# Patient Record
Sex: Male | Born: 1935 | Race: White | Hispanic: No | Marital: Married | Smoking: Never smoker
Health system: Southern US, Community
[De-identification: ages and names within clinical notes are randomized; demographics above are authoritative.]

## PROBLEM LIST (undated history)

## (undated) DIAGNOSIS — B49 Unspecified mycosis: Secondary | ICD-10-CM

## (undated) DIAGNOSIS — D126 Benign neoplasm of colon, unspecified: Secondary | ICD-10-CM

## (undated) DIAGNOSIS — I350 Nonrheumatic aortic (valve) stenosis: Secondary | ICD-10-CM

## (undated) DIAGNOSIS — Z87442 Personal history of urinary calculi: Secondary | ICD-10-CM

## (undated) DIAGNOSIS — J189 Pneumonia, unspecified organism: Secondary | ICD-10-CM

## (undated) DIAGNOSIS — N184 Chronic kidney disease, stage 4 (severe): Secondary | ICD-10-CM

## (undated) DIAGNOSIS — I10 Essential (primary) hypertension: Secondary | ICD-10-CM

## (undated) DIAGNOSIS — M3130 Wegener's granulomatosis without renal involvement: Secondary | ICD-10-CM

## (undated) DIAGNOSIS — K449 Diaphragmatic hernia without obstruction or gangrene: Secondary | ICD-10-CM

## (undated) DIAGNOSIS — I340 Nonrheumatic mitral (valve) insufficiency: Secondary | ICD-10-CM

## (undated) DIAGNOSIS — J42 Unspecified chronic bronchitis: Secondary | ICD-10-CM

## (undated) DIAGNOSIS — K922 Gastrointestinal hemorrhage, unspecified: Secondary | ICD-10-CM

## (undated) DIAGNOSIS — K219 Gastro-esophageal reflux disease without esophagitis: Secondary | ICD-10-CM

## (undated) DIAGNOSIS — H9190 Unspecified hearing loss, unspecified ear: Secondary | ICD-10-CM

## (undated) DIAGNOSIS — I724 Aneurysm of artery of lower extremity: Secondary | ICD-10-CM

## (undated) DIAGNOSIS — K5790 Diverticulosis of intestine, part unspecified, without perforation or abscess without bleeding: Secondary | ICD-10-CM

## (undated) DIAGNOSIS — N400 Enlarged prostate without lower urinary tract symptoms: Secondary | ICD-10-CM

## (undated) DIAGNOSIS — K222 Esophageal obstruction: Secondary | ICD-10-CM

## (undated) DIAGNOSIS — J449 Chronic obstructive pulmonary disease, unspecified: Secondary | ICD-10-CM

## (undated) DIAGNOSIS — J479 Bronchiectasis, uncomplicated: Secondary | ICD-10-CM

## (undated) DIAGNOSIS — I2510 Atherosclerotic heart disease of native coronary artery without angina pectoris: Secondary | ICD-10-CM

## (undated) DIAGNOSIS — Z9289 Personal history of other medical treatment: Secondary | ICD-10-CM

## (undated) DIAGNOSIS — D649 Anemia, unspecified: Secondary | ICD-10-CM

## (undated) DIAGNOSIS — I255 Ischemic cardiomyopathy: Secondary | ICD-10-CM

## (undated) HISTORY — DX: Benign neoplasm of colon, unspecified: D12.6

## (undated) HISTORY — DX: Bronchiectasis, uncomplicated: J47.9

## (undated) HISTORY — DX: Gastro-esophageal reflux disease without esophagitis: K21.9

## (undated) HISTORY — PX: RENAL BIOPSY: SHX156

## (undated) HISTORY — DX: Benign prostatic hyperplasia without lower urinary tract symptoms: N40.0

## (undated) HISTORY — PX: TRANSTHORACIC ECHOCARDIOGRAM: SHX275

## (undated) HISTORY — DX: Diverticulosis of intestine, part unspecified, without perforation or abscess without bleeding: K57.90

## (undated) HISTORY — DX: Wegener's granulomatosis without renal involvement: M31.30

## (undated) HISTORY — DX: Anemia, unspecified: D64.9

## (undated) HISTORY — DX: Unspecified mycosis: B49

## (undated) HISTORY — PX: INGUINAL HERNIA REPAIR: SUR1180

## (undated) HISTORY — DX: Diaphragmatic hernia without obstruction or gangrene: K44.9

## (undated) HISTORY — DX: Esophageal obstruction: K22.2

---

## 2000-08-08 LAB — HM COLONOSCOPY

## 2000-09-26 ENCOUNTER — Ambulatory Visit (HOSPITAL_COMMUNITY): Admission: RE | Admit: 2000-09-26 | Discharge: 2000-09-26 | Payer: Self-pay | Admitting: Internal Medicine

## 2000-09-26 ENCOUNTER — Encounter: Payer: Self-pay | Admitting: Internal Medicine

## 2001-01-17 ENCOUNTER — Ambulatory Visit (HOSPITAL_COMMUNITY): Admission: RE | Admit: 2001-01-17 | Discharge: 2001-01-17 | Payer: Self-pay | Admitting: Gastroenterology

## 2001-06-28 ENCOUNTER — Ambulatory Visit (HOSPITAL_COMMUNITY): Admission: RE | Admit: 2001-06-28 | Discharge: 2001-06-28 | Payer: Self-pay | Admitting: *Deleted

## 2001-07-02 ENCOUNTER — Ambulatory Visit (HOSPITAL_COMMUNITY): Admission: RE | Admit: 2001-07-02 | Discharge: 2001-07-02 | Payer: Self-pay | Admitting: Urology

## 2001-12-13 ENCOUNTER — Encounter: Admission: RE | Admit: 2001-12-13 | Discharge: 2001-12-13 | Payer: Self-pay | Admitting: Urology

## 2001-12-13 ENCOUNTER — Encounter: Payer: Self-pay | Admitting: Urology

## 2001-12-18 ENCOUNTER — Encounter: Admission: RE | Admit: 2001-12-18 | Discharge: 2001-12-18 | Payer: Self-pay | Admitting: Urology

## 2001-12-18 ENCOUNTER — Encounter: Payer: Self-pay | Admitting: Urology

## 2001-12-24 ENCOUNTER — Ambulatory Visit (HOSPITAL_BASED_OUTPATIENT_CLINIC_OR_DEPARTMENT_OTHER): Admission: RE | Admit: 2001-12-24 | Discharge: 2001-12-24 | Payer: Self-pay | Admitting: Urology

## 2001-12-24 ENCOUNTER — Encounter: Payer: Self-pay | Admitting: Urology

## 2002-01-25 ENCOUNTER — Encounter: Admission: RE | Admit: 2002-01-25 | Discharge: 2002-01-25 | Payer: Self-pay | Admitting: Family Medicine

## 2002-01-25 ENCOUNTER — Encounter: Payer: Self-pay | Admitting: Urology

## 2002-01-25 ENCOUNTER — Encounter: Payer: Self-pay | Admitting: Family Medicine

## 2002-01-25 ENCOUNTER — Ambulatory Visit (HOSPITAL_COMMUNITY): Admission: RE | Admit: 2002-01-25 | Discharge: 2002-01-26 | Payer: Self-pay | Admitting: Interventional Radiology

## 2002-01-29 ENCOUNTER — Ambulatory Visit (HOSPITAL_COMMUNITY): Admission: RE | Admit: 2002-01-29 | Discharge: 2002-01-29 | Payer: Self-pay | Admitting: Urology

## 2002-01-29 ENCOUNTER — Encounter: Payer: Self-pay | Admitting: Urology

## 2002-02-12 ENCOUNTER — Encounter: Admission: RE | Admit: 2002-02-12 | Discharge: 2002-02-12 | Payer: Self-pay | Admitting: Urology

## 2002-02-12 ENCOUNTER — Encounter: Payer: Self-pay | Admitting: Urology

## 2002-02-14 ENCOUNTER — Ambulatory Visit (HOSPITAL_BASED_OUTPATIENT_CLINIC_OR_DEPARTMENT_OTHER): Admission: RE | Admit: 2002-02-14 | Discharge: 2002-02-14 | Payer: Self-pay | Admitting: Urology

## 2002-02-14 ENCOUNTER — Encounter: Payer: Self-pay | Admitting: Urology

## 2002-02-25 ENCOUNTER — Encounter: Admission: RE | Admit: 2002-02-25 | Discharge: 2002-02-25 | Payer: Self-pay | Admitting: Urology

## 2002-02-25 ENCOUNTER — Encounter: Payer: Self-pay | Admitting: Urology

## 2002-02-27 ENCOUNTER — Encounter: Admission: RE | Admit: 2002-02-27 | Discharge: 2002-02-27 | Payer: Self-pay | Admitting: Urology

## 2002-02-27 ENCOUNTER — Encounter: Payer: Self-pay | Admitting: Urology

## 2002-02-28 ENCOUNTER — Ambulatory Visit (HOSPITAL_COMMUNITY): Admission: RE | Admit: 2002-02-28 | Discharge: 2002-02-28 | Payer: Self-pay | Admitting: Urology

## 2002-03-05 ENCOUNTER — Encounter: Payer: Self-pay | Admitting: Urology

## 2002-03-05 ENCOUNTER — Encounter: Admission: RE | Admit: 2002-03-05 | Discharge: 2002-03-05 | Payer: Self-pay | Admitting: Urology

## 2002-03-19 ENCOUNTER — Encounter: Admission: RE | Admit: 2002-03-19 | Discharge: 2002-03-19 | Payer: Self-pay | Admitting: Urology

## 2002-03-19 ENCOUNTER — Encounter: Payer: Self-pay | Admitting: Urology

## 2002-04-05 ENCOUNTER — Encounter: Payer: Self-pay | Admitting: Urology

## 2002-04-05 ENCOUNTER — Encounter: Admission: RE | Admit: 2002-04-05 | Discharge: 2002-04-05 | Payer: Self-pay | Admitting: Urology

## 2002-04-15 ENCOUNTER — Ambulatory Visit (HOSPITAL_BASED_OUTPATIENT_CLINIC_OR_DEPARTMENT_OTHER): Admission: RE | Admit: 2002-04-15 | Discharge: 2002-04-15 | Payer: Self-pay | Admitting: Urology

## 2002-09-09 ENCOUNTER — Encounter: Admission: RE | Admit: 2002-09-09 | Discharge: 2002-09-09 | Payer: Self-pay | Admitting: *Deleted

## 2003-07-14 ENCOUNTER — Encounter: Admission: RE | Admit: 2003-07-14 | Discharge: 2003-07-14 | Payer: Self-pay | Admitting: Family Medicine

## 2003-07-24 ENCOUNTER — Encounter: Admission: RE | Admit: 2003-07-24 | Discharge: 2003-07-24 | Payer: Self-pay | Admitting: Family Medicine

## 2004-03-04 IMAGING — CT CT CHEST W/ CM
1 of 2 series · 14 of 30 positions shown, 18 images · IV contrast (100 ML OMNI 300)
Comparison: none

CLINICAL DATA: Cough.  Questionable nodule on recent chest x-ray.   Con 493.0. 
CT CHEST W/CONTRAST
Multidetector helical scans through the chest were performed after IV contrast media was given.  055cc of Omnipaque 300 were given as the contrast media to this patient with a history of asthma on an inhaler.  This study is compared to limited CT of the chest from [REDACTED] through the lung bases dated 09/26/00 as well as a two view chest x-ray from [REDACTED] dated 07/14/03. 
At the site questioned in the periphery of the left upper lung field there is a small pleural based opacity on image number 19 present measuring 5mm.  Also within the right lung apex posteromedially there is nodular soft tissue present.  On image number 5 this nodular area measures 24 x 14mm.  Although this may simply represent right apical pleuroparenchymal scarring a right apical lesion is difficult to exclude and follow up CT of the chest in four months is recommended.  There is focal pleural thickening in the anterolateral right upper lobe as well on image number 16.  There is a small vague nodular opacity in the inferior right upper lobe on image number 20 as well as a poorly defined opacity posterior in the left lower lobe on image number 31.  Peripheral opacity is noted in the right middle lobe on image number 35 with a small nodular opacity in the lingula on image number 37.  Also changes of bronchiectasis are noted involving the left lower lobe, to a lesser degree lingula and right middle lobe.  In view of the bronchiectatic change the nodular opacities present may be related to scarring from prior infection.  Follow up CT of the chest is recommended in four months to assess stability.  No pleural effusion is seen.  No mediastinal or hilar adenopathy is noted.  The pulmonary arteries and thoracic aorta opacify normally.  There is a large hiatal hernia present.  
IMPRESSION
The nodular opacity questioned on recent chest x-ray in the periphery of the left upper lobe is identified on CT of the chest and measures approximately 5mm.  However there are other poorly defined nodular opacities throughout the lungs several of which appear most typical of scarring.  There is also nodular opacity in the posteromedial right lung apex which may represent scarring or conceivably tumor.  In view of these findings follow up CT of the chest in four months is recommended. 
Changes of bronchiectasis are noted primarily involving the left lower lobe with lesser involvement of the lingula and right middle lobe as well.  
Large hiatal hernia.

[Series 2: — · axial · 0.66mm/px · z∈[-256,+14]mm · 14 of 64 slices shown, 18 images]
[im 5/64  mediastinal]
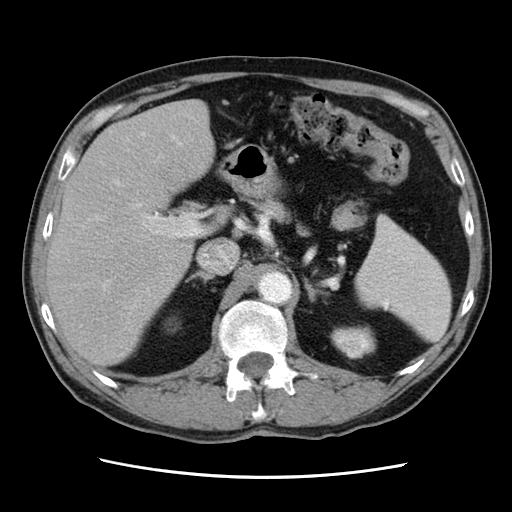
[im 5/64  lung]
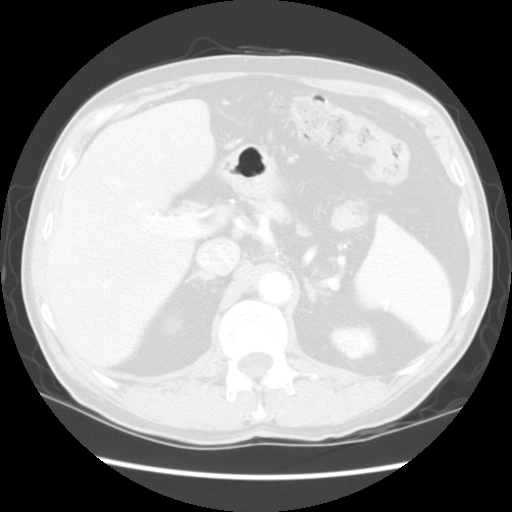
[im 10/64  lung]
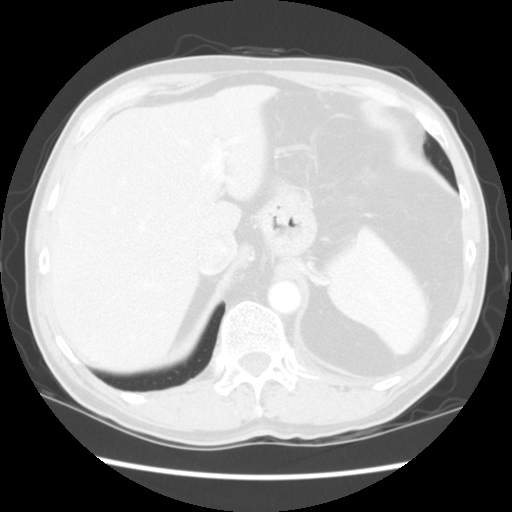
[im 14/64  lung]
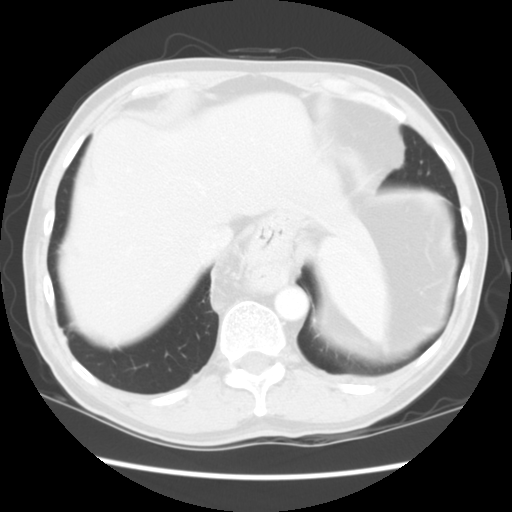
[im 19/64  lung]
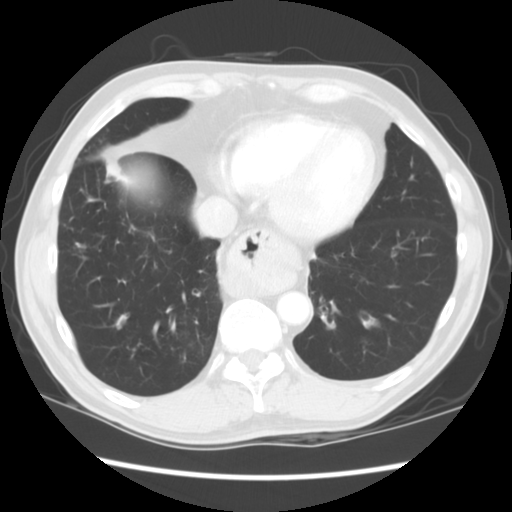
[im 23/64  mediastinal]
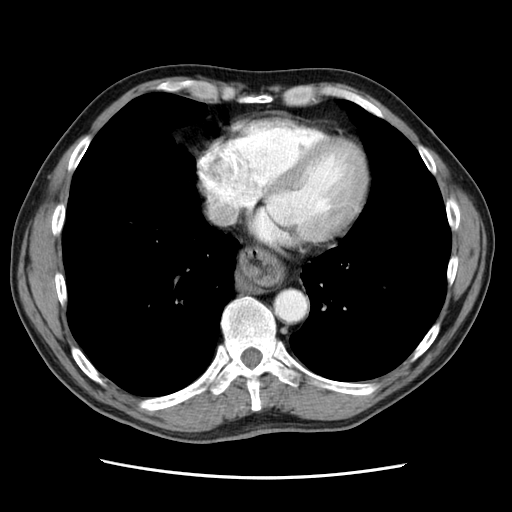
[im 23/64  lung]
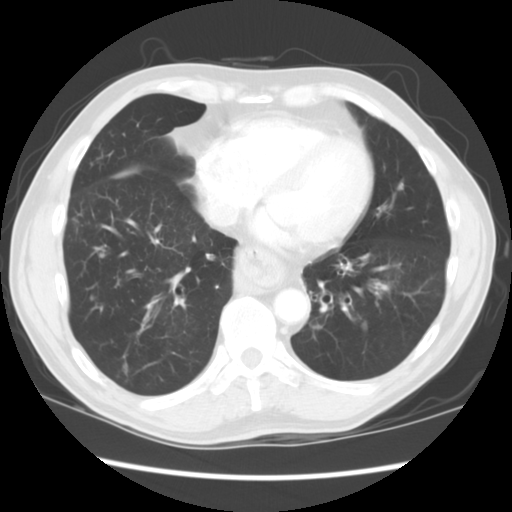
[im 28/64  lung]
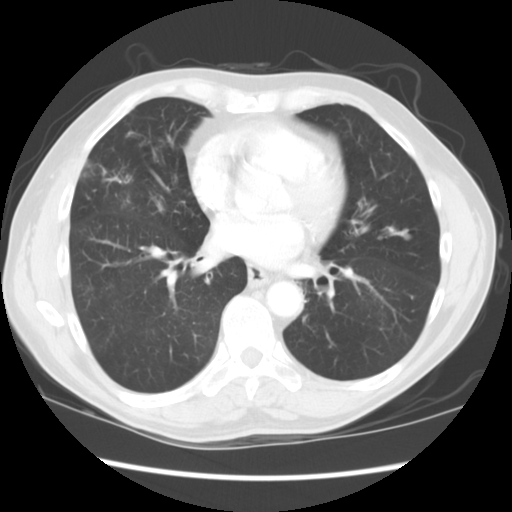
[im 31/64  lung]
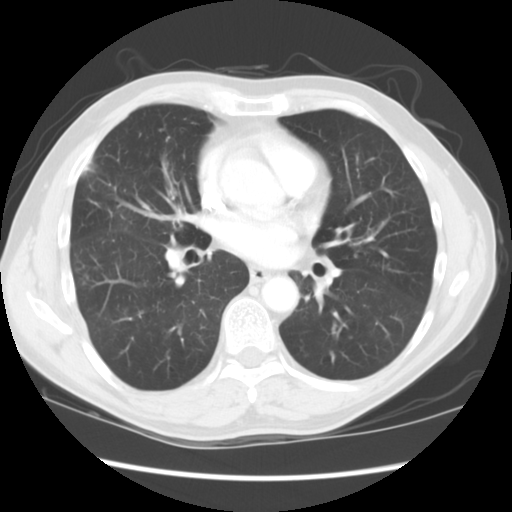
[im 32/64  lung]
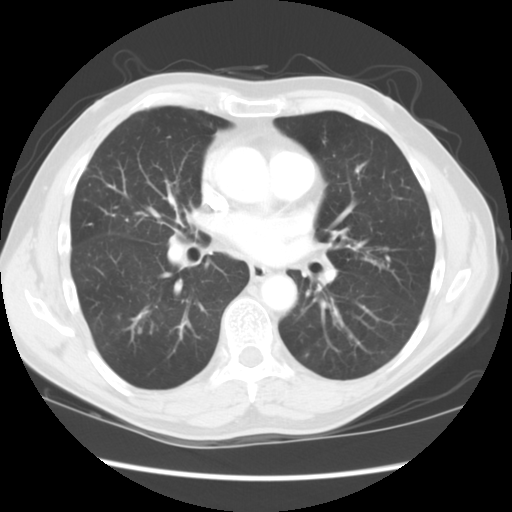
[im 37/64  mediastinal]
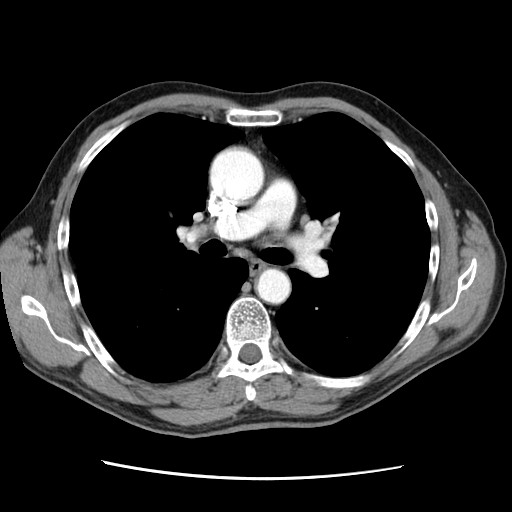
[im 37/64  lung]
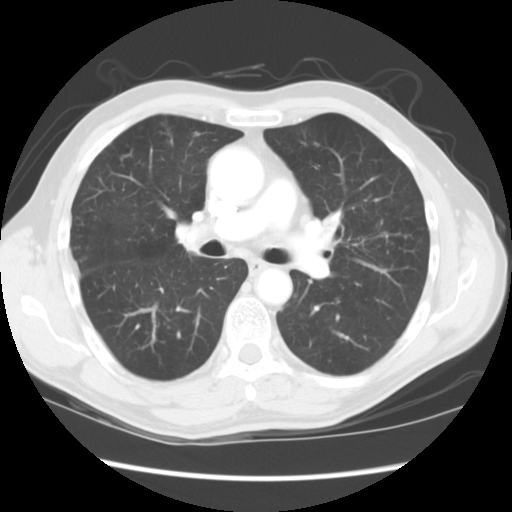
[im 41/64  lung]
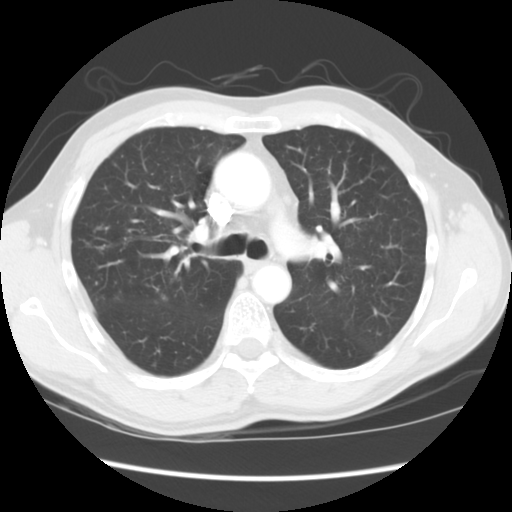
[im 46/64  lung]
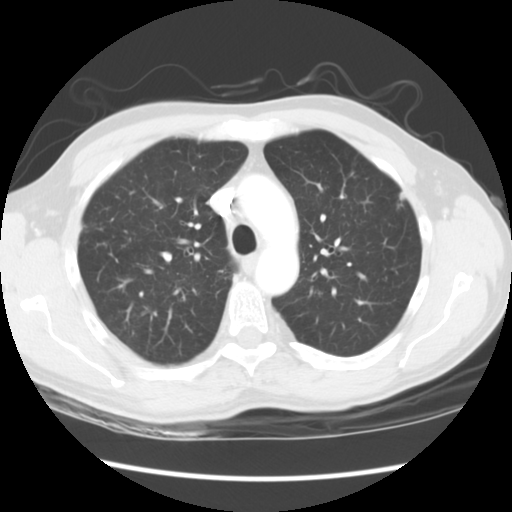
[im 50/64  lung]
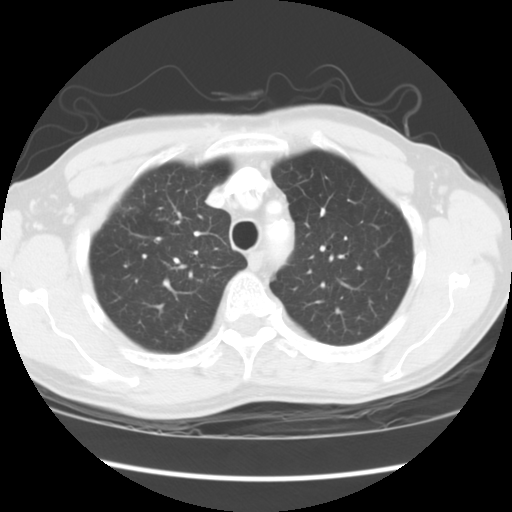
[im 55/64  mediastinal]
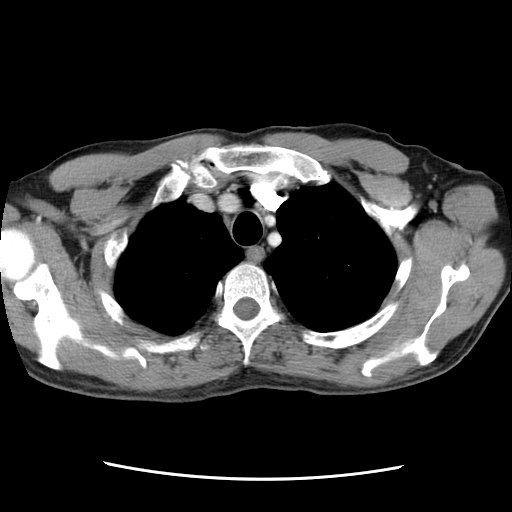
[im 55/64  lung]
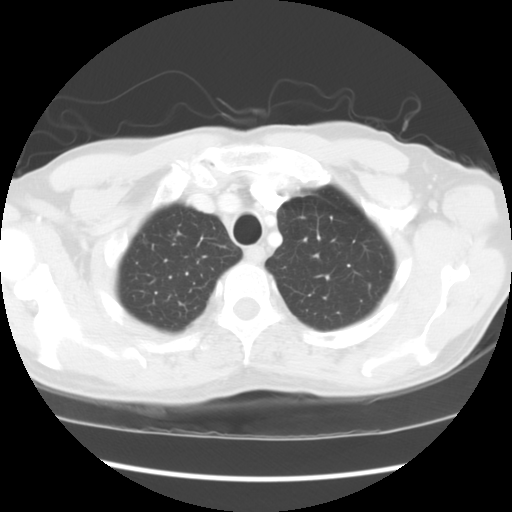
[im 59/64  lung]
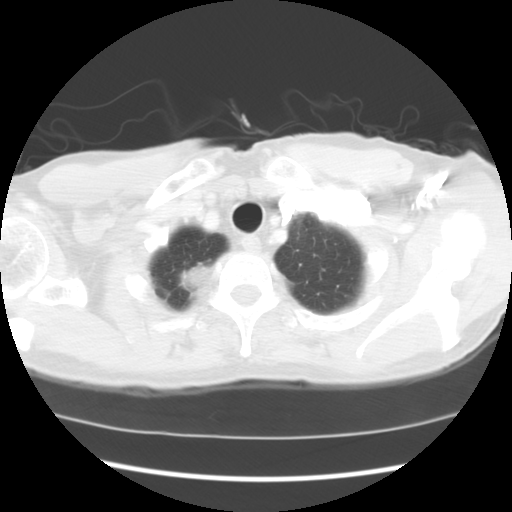

[14 of 30 positions shown; findings below may reference images not displayed]

## 2004-05-27 ENCOUNTER — Emergency Department (HOSPITAL_COMMUNITY): Admission: EM | Admit: 2004-05-27 | Discharge: 2004-05-27 | Payer: Self-pay | Admitting: Emergency Medicine

## 2004-06-21 ENCOUNTER — Encounter (INDEPENDENT_AMBULATORY_CARE_PROVIDER_SITE_OTHER): Payer: Self-pay | Admitting: *Deleted

## 2004-06-21 ENCOUNTER — Ambulatory Visit (HOSPITAL_COMMUNITY): Admission: RE | Admit: 2004-06-21 | Discharge: 2004-06-21 | Payer: Self-pay | Admitting: Internal Medicine

## 2004-09-03 ENCOUNTER — Ambulatory Visit: Payer: Self-pay | Admitting: Internal Medicine

## 2004-10-05 ENCOUNTER — Ambulatory Visit: Payer: Self-pay | Admitting: Family Medicine

## 2004-10-26 ENCOUNTER — Ambulatory Visit: Payer: Self-pay | Admitting: Family Medicine

## 2004-11-15 ENCOUNTER — Ambulatory Visit: Payer: Self-pay | Admitting: Family Medicine

## 2004-11-23 ENCOUNTER — Ambulatory Visit: Payer: Self-pay | Admitting: Family Medicine

## 2004-12-03 ENCOUNTER — Ambulatory Visit: Payer: Self-pay | Admitting: Family Medicine

## 2004-12-07 ENCOUNTER — Ambulatory Visit: Payer: Self-pay | Admitting: Family Medicine

## 2004-12-21 ENCOUNTER — Ambulatory Visit: Payer: Self-pay | Admitting: Family Medicine

## 2005-01-04 ENCOUNTER — Ambulatory Visit: Payer: Self-pay | Admitting: Internal Medicine

## 2005-01-06 IMAGING — CR DG HAND COMPLETE 3+V*L*
3 series · 3 of 3 positions shown · non-contrast
Comparison: None.

CLINICAL DATA: Laceration over the MCP of the index finger.  Pain.  

 TWO VIEW LEFT HAND, 05/27/04

[view not recorded (1 of 3)]
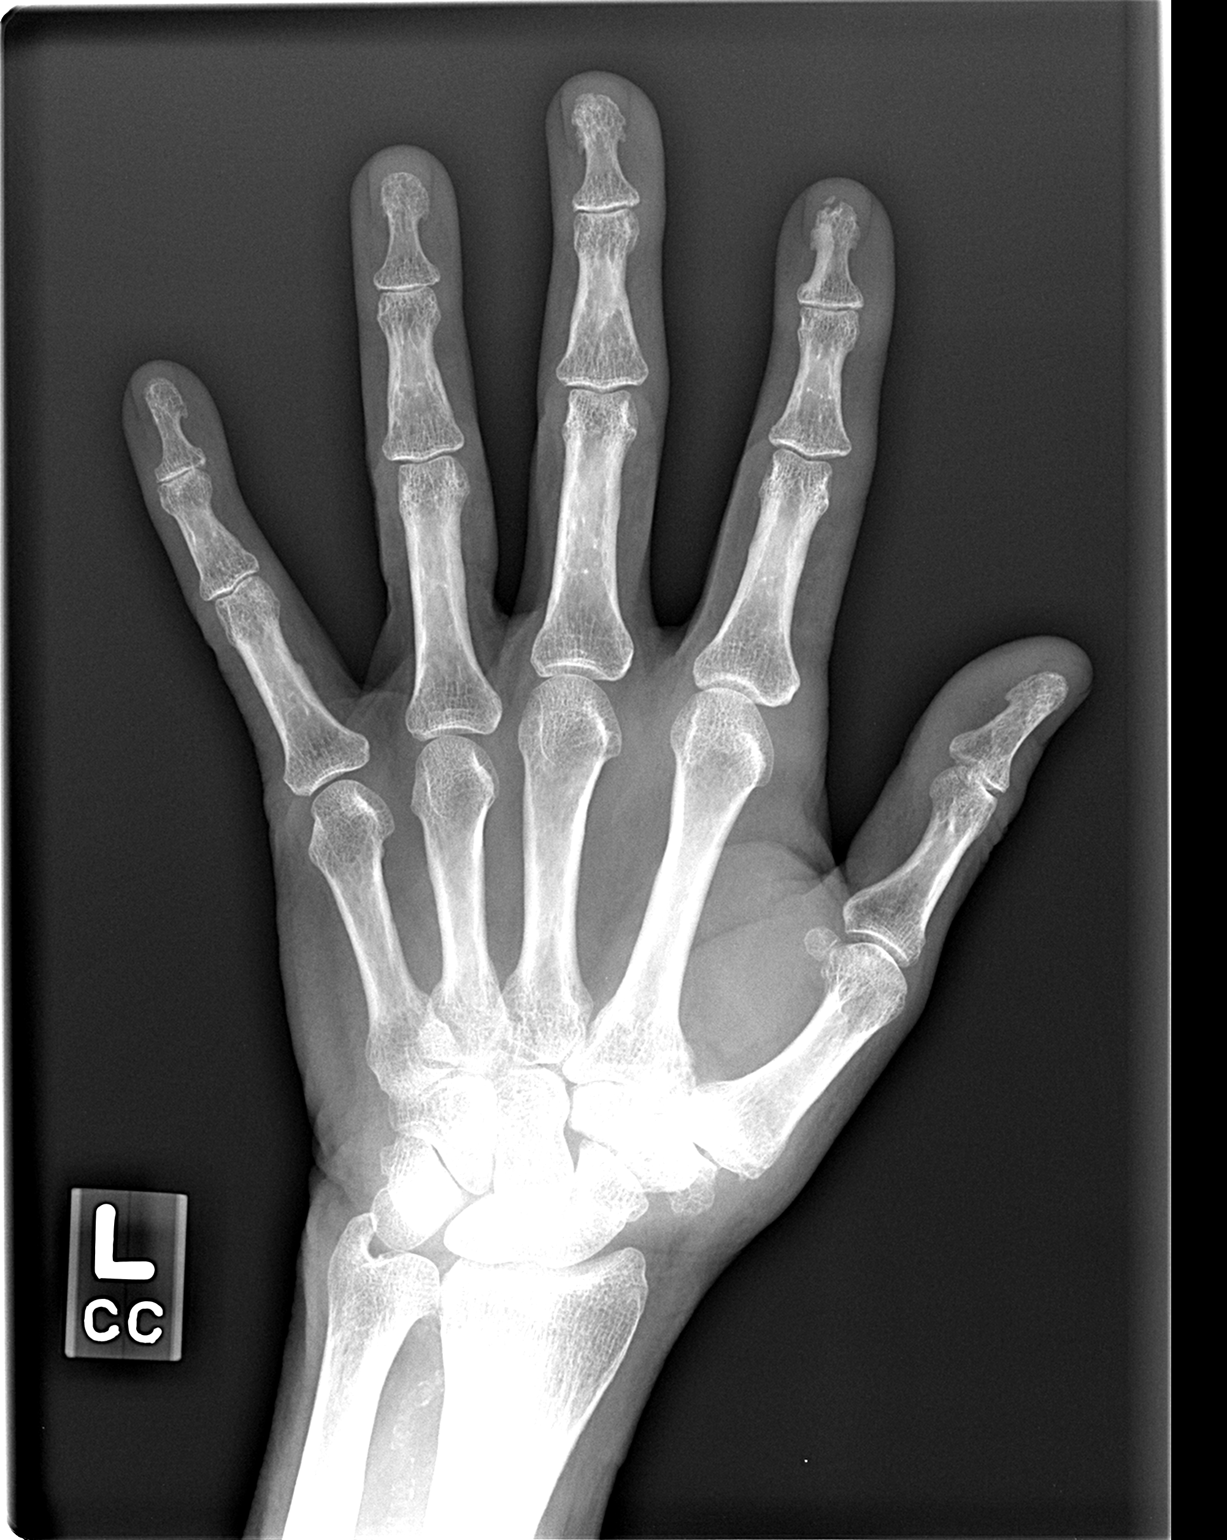

[view not recorded (2 of 3)]
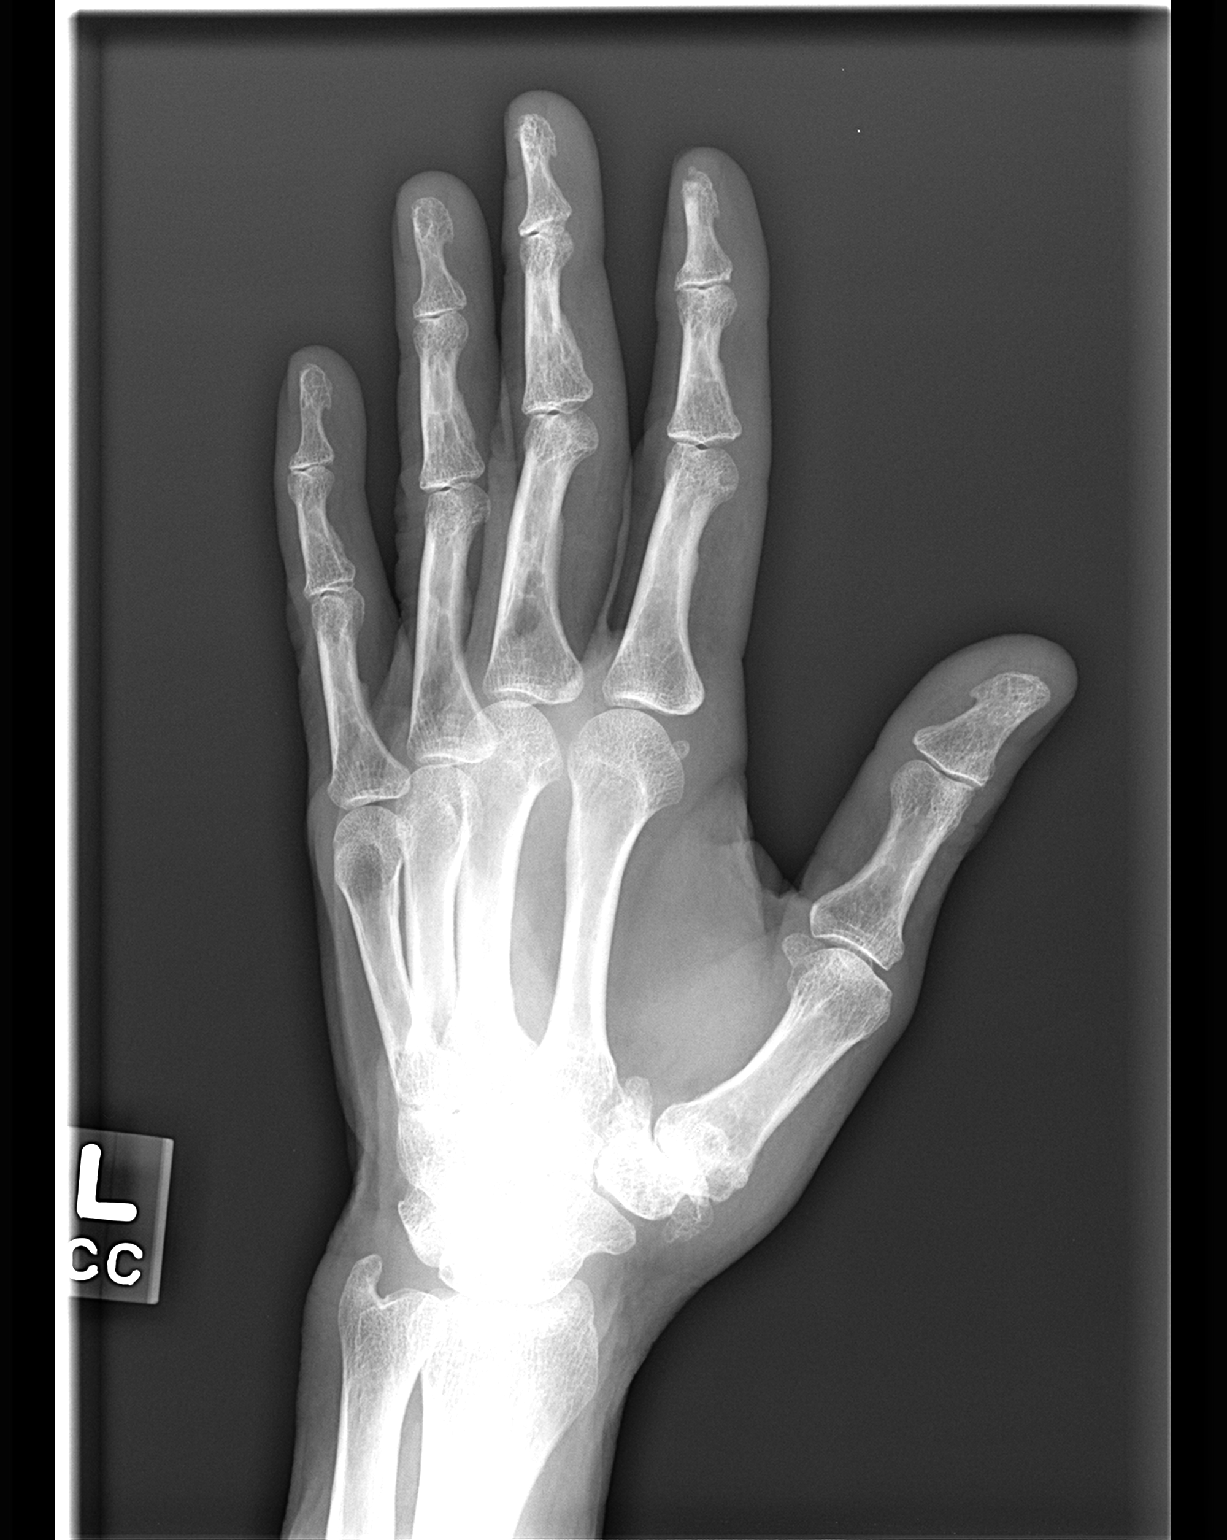

[view not recorded (3 of 3)]
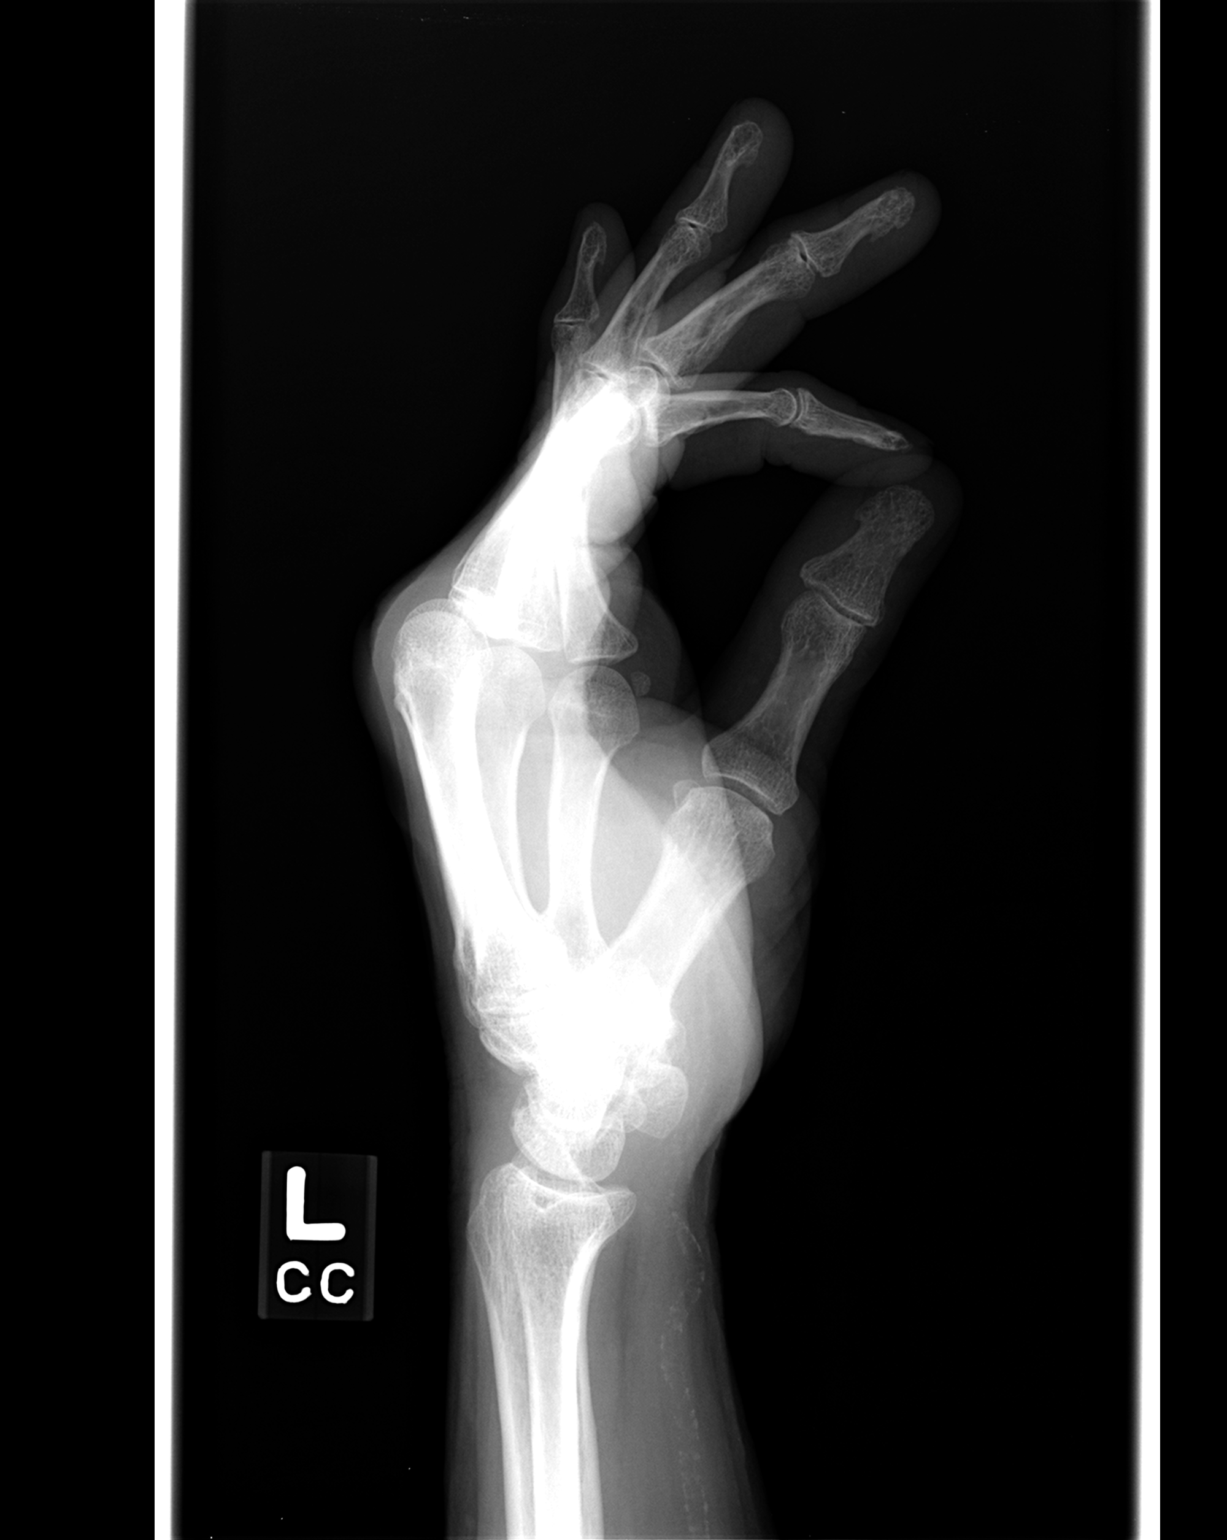

[3 of 3 positions shown; findings below may reference images not displayed]

FINDINGS: Two view exam of the left hand shows tiny fragment adjacent to the tuft of the index finger distal phalanx but this has nonacute features.  No acute fracture/subluxation identified.  Degenerative change characterizes the first carpometacarpal joint.  

 IMPRESSION
 Tiny fragment adjacent to the tuft of the index finger distal phalanx is likely nonacute.  Please correlate for point tenderness at this location. 

 Osteoarthritic change at the first carpometacarpal joint.  

 Vascular calcification suggests diabetes. 

 [REDACTED]

## 2005-01-31 IMAGING — RF DG ESOPHAGUS
7 of 8 series · 15 of 20 positions shown · non-contrast
Comparison: none

CLINICAL DATA: Dysphagia, repeated respiratory infection, rule out reflux. 
 ESOPHAGUS:
 After a barium meal, the swallowing mechanism was initiated without difficulty.  Prominent cricopharyngeus is noted without Taitano?Lao diverticulum.  No mucosal abnormality of the esophagus is noted.  The patient is noted to have a moderate sized paraesophageal hiatal hernia.  A 13 mm tablet passes into the stomach after moderate delay in the esophagogastric junction region.  There is a slight amount of gastroesophageal reflux noted without any definite aspiration noted.

[Series 2: run · 5 of 7 slices shown (1 of 7)]
[im 1/7]
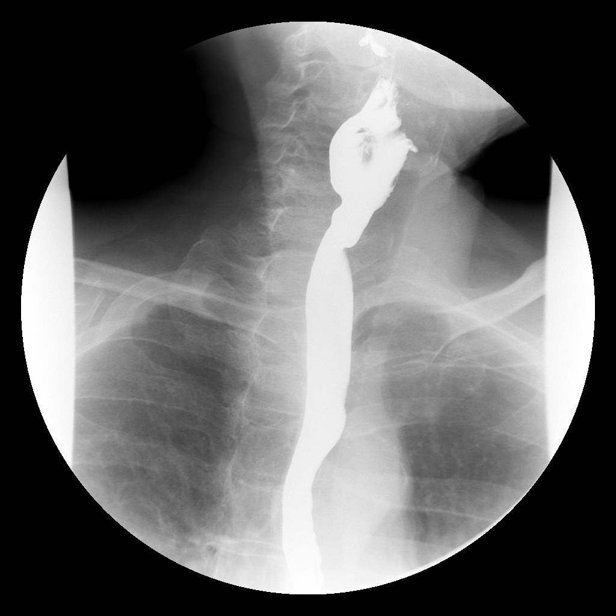
[im 3/7]
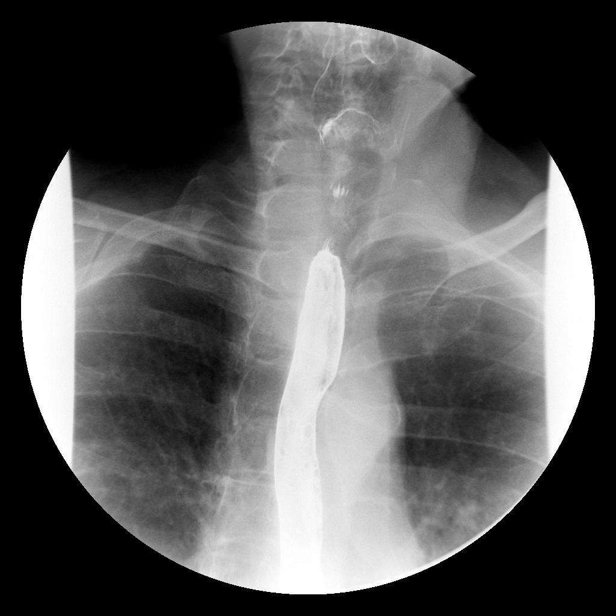
[im 4/7]
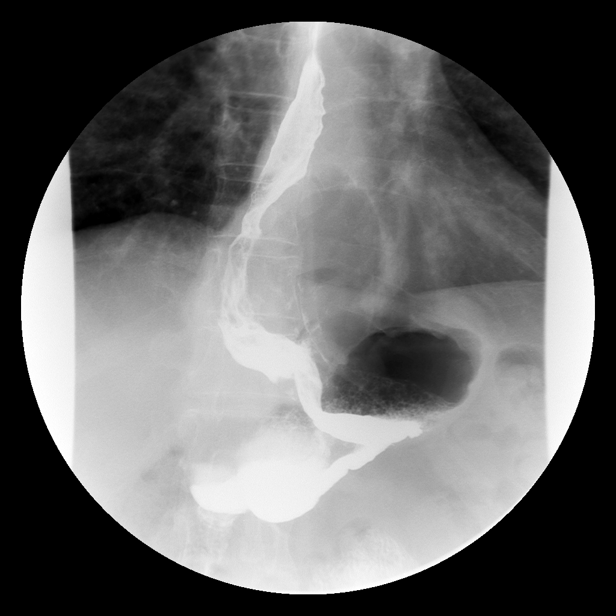
[im 5/7]
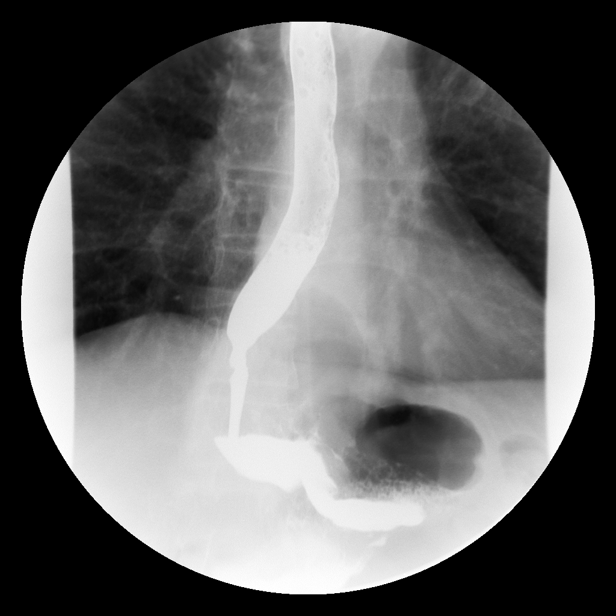
[im 7/7]
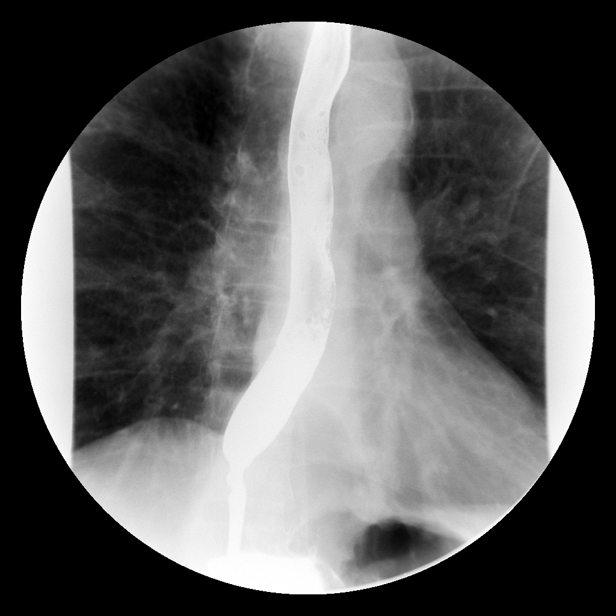

[Series 3: run · 5 of 7 slices shown (2 of 7)]
[im 1/7]
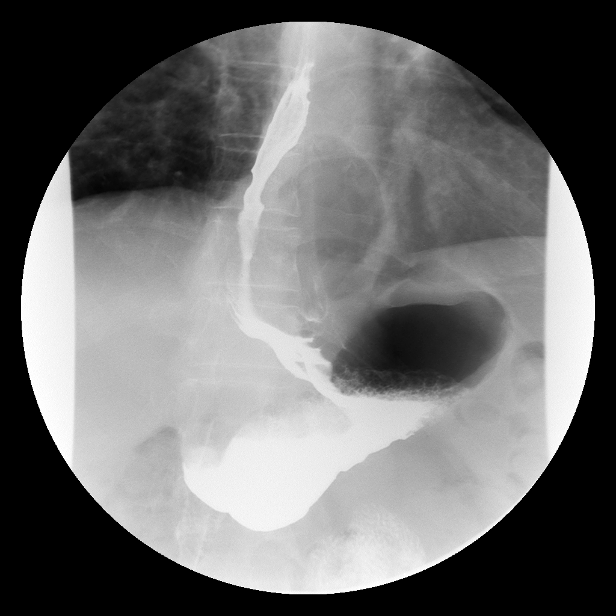
[im 2/7]
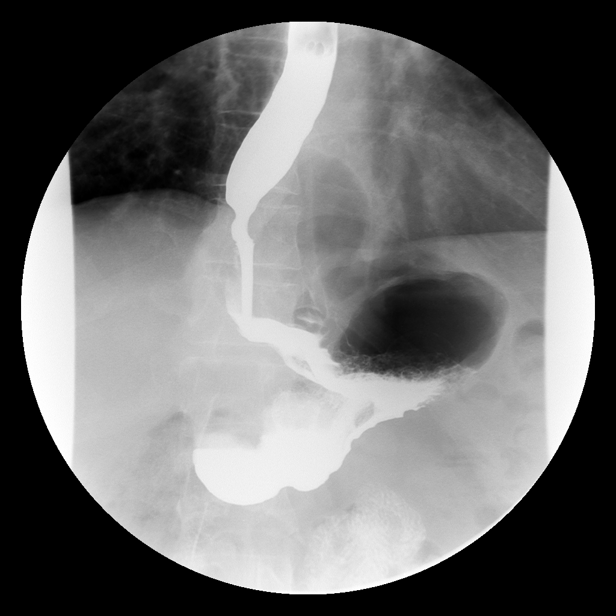
[im 4/7]
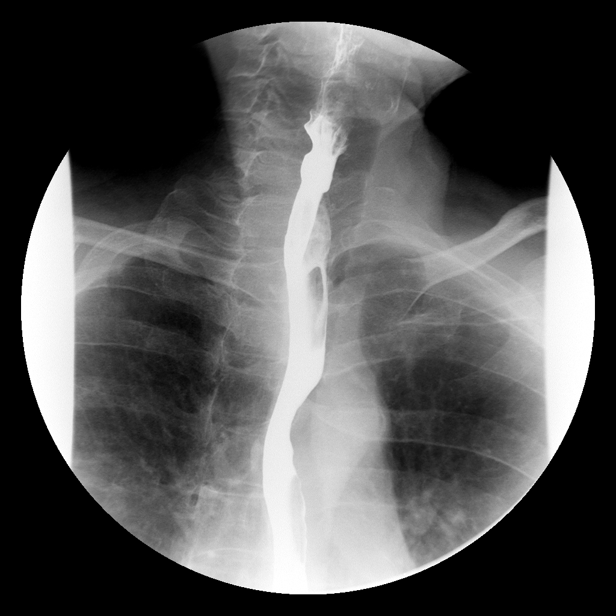
[im 5/7]
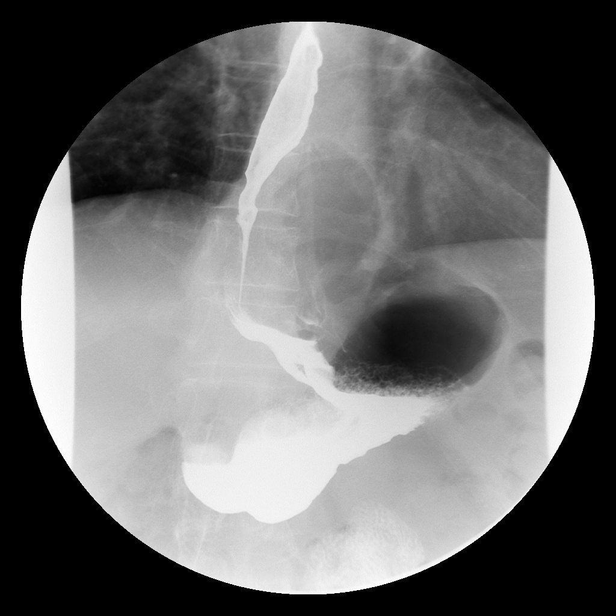
[im 6/7]
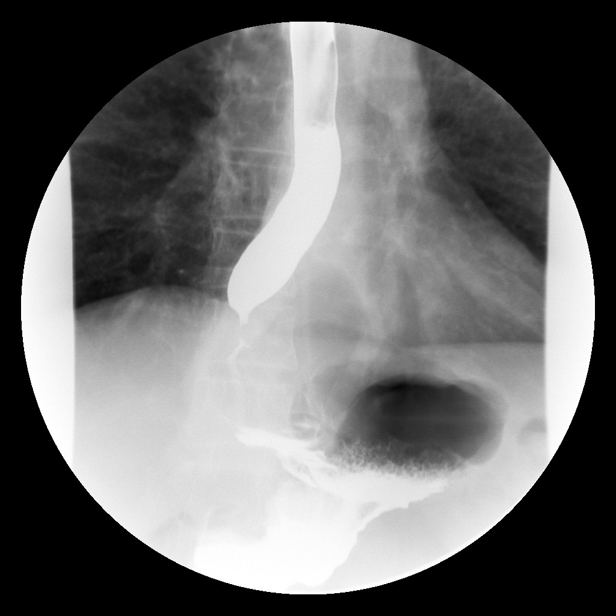

[Series 5: run · 1 of 1 slices shown (3 of 7)]
[im 1/1]
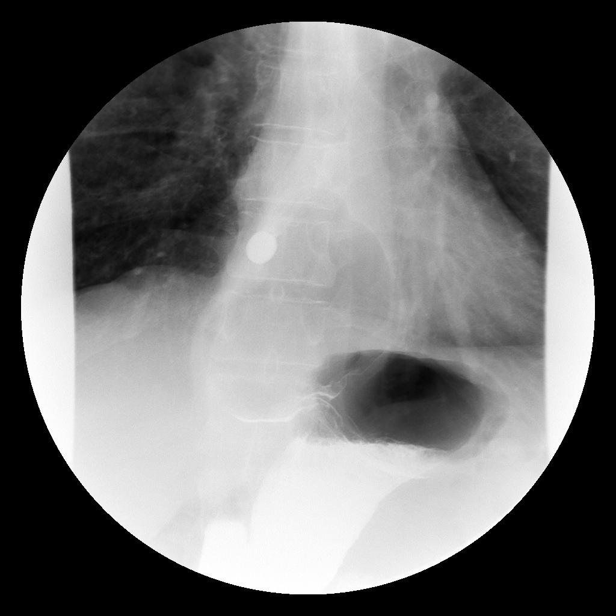

[Series 6: run · 1 of 1 slices shown (4 of 7)]
[im 1/1]
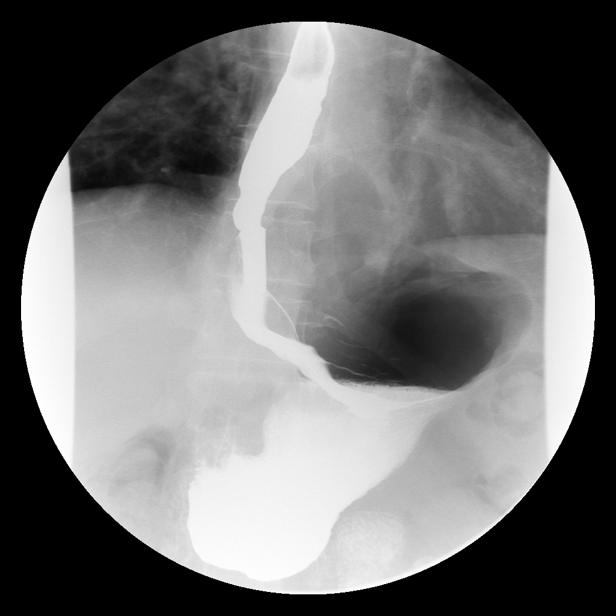

[Series 8: run · 1 of 1 slices shown (5 of 7)]
[im 1/1]
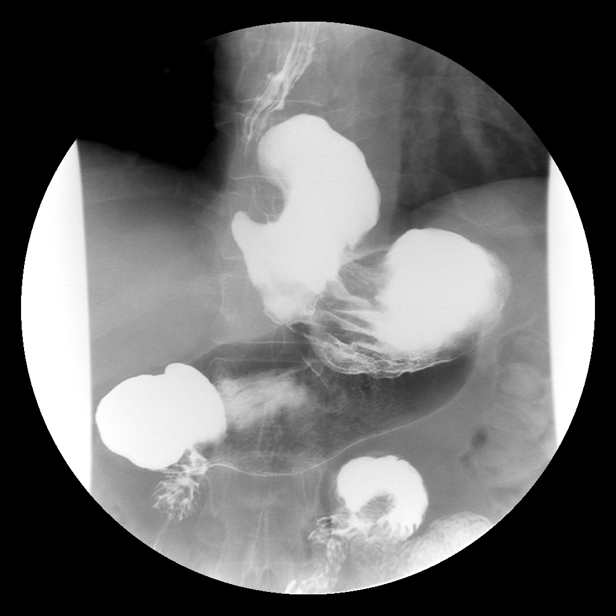

[Series 10: run · 1 of 1 slices shown (6 of 7)]
[im 1/1]
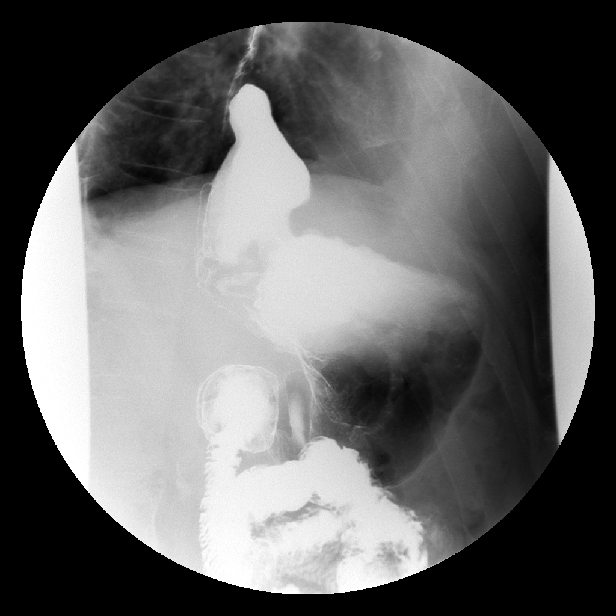

[Series 11: run · 1 of 1 slices shown (7 of 7)]
[im 1/1]
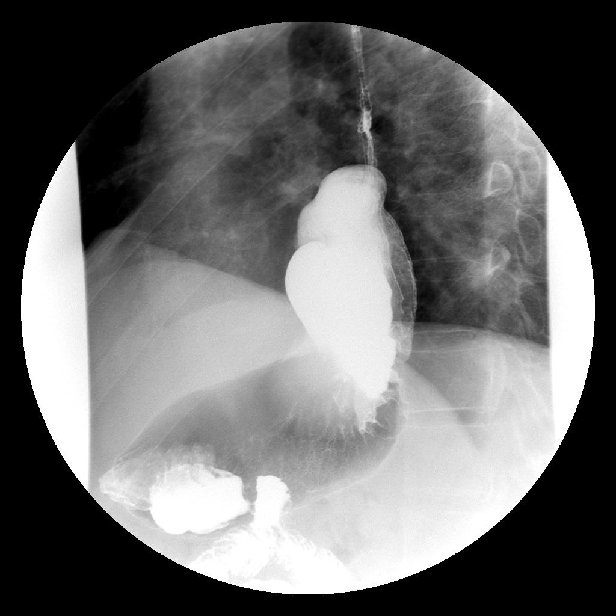

[15 of 20 positions shown; findings below may reference images not displayed]

IMPRESSION: Moderate sized paraesophageal hiatal hernia.  A small amount of gastroesophageal reflux is noted but without evidence of aspiration or mucosal abnormality.  The patient was able to swallow a 13 mm tablet.

## 2005-03-15 ENCOUNTER — Ambulatory Visit: Payer: Self-pay | Admitting: Family Medicine

## 2005-04-13 ENCOUNTER — Ambulatory Visit: Payer: Self-pay | Admitting: Family Medicine

## 2005-05-03 ENCOUNTER — Ambulatory Visit: Payer: Self-pay | Admitting: Internal Medicine

## 2005-05-17 ENCOUNTER — Ambulatory Visit: Payer: Self-pay | Admitting: Family Medicine

## 2005-06-15 ENCOUNTER — Ambulatory Visit: Payer: Self-pay | Admitting: Family Medicine

## 2005-08-10 ENCOUNTER — Ambulatory Visit: Payer: Self-pay | Admitting: Family Medicine

## 2005-10-06 ENCOUNTER — Ambulatory Visit: Payer: Self-pay | Admitting: Family Medicine

## 2005-11-29 ENCOUNTER — Ambulatory Visit: Payer: Self-pay | Admitting: Family Medicine

## 2005-12-01 ENCOUNTER — Ambulatory Visit: Payer: Self-pay | Admitting: Cardiology

## 2005-12-14 ENCOUNTER — Ambulatory Visit: Payer: Self-pay | Admitting: Family Medicine

## 2005-12-27 ENCOUNTER — Ambulatory Visit: Payer: Self-pay | Admitting: Family Medicine

## 2005-12-28 ENCOUNTER — Ambulatory Visit: Payer: Self-pay | Admitting: Family Medicine

## 2006-03-21 ENCOUNTER — Ambulatory Visit: Payer: Self-pay | Admitting: Family Medicine

## 2006-04-11 ENCOUNTER — Ambulatory Visit: Payer: Self-pay | Admitting: Family Medicine

## 2006-04-19 ENCOUNTER — Ambulatory Visit: Payer: Self-pay | Admitting: Family Medicine

## 2006-05-04 ENCOUNTER — Ambulatory Visit: Payer: Self-pay | Admitting: Family Medicine

## 2006-06-02 ENCOUNTER — Ambulatory Visit: Payer: Self-pay | Admitting: Family Medicine

## 2006-06-20 ENCOUNTER — Ambulatory Visit: Payer: Self-pay | Admitting: Family Medicine

## 2006-07-13 ENCOUNTER — Ambulatory Visit: Payer: Self-pay | Admitting: Family Medicine

## 2006-07-13 IMAGING — CT CT CHEST W/ CM
2 of 4 series · 15 of 36 positions shown, 18 images · IV contrast (APPLIED)
Comparison: 07/24/03 CT from [HOSPITAL].

CLINICAL DATA: Dyspnea and cough for one week.  Status post antiobiotic therapy.  Followup nodular density on prior chest CT.
 CHEST CT WITH CONTRAST:
TECHNIQUE: Multidetector CT imaging of the chest was performed following the standard protocol during bolus administration of intravenous contrast.
 Contrast:  80 cc Omnipaque 300

[Series 2: chest_routine 5.0 b40f st · axial · 0.67mm/px · z∈[-364,-69]mm · 12 of 69 slices shown, 15 images]
[im 5/69  mediastinal]
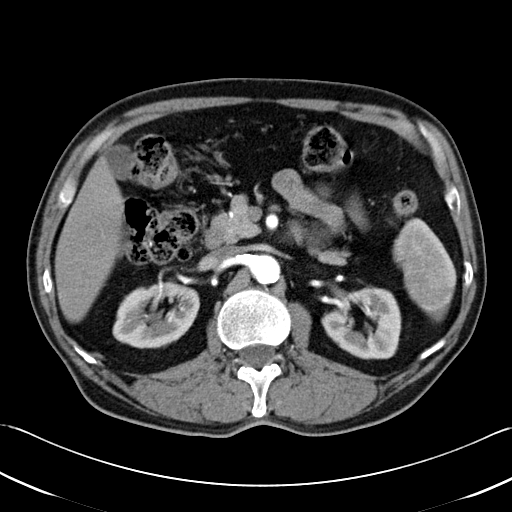
[im 5/69  lung]
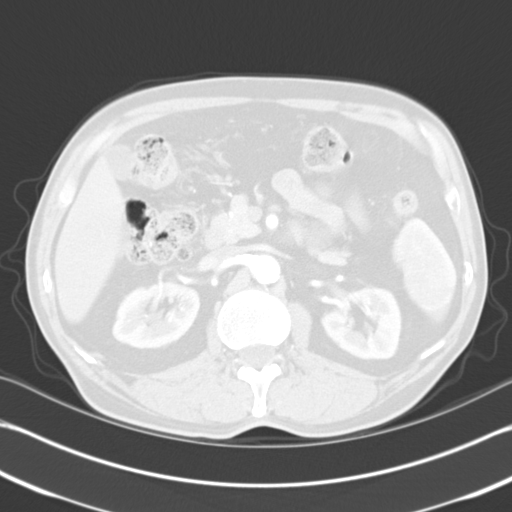
[im 10/69  lung]
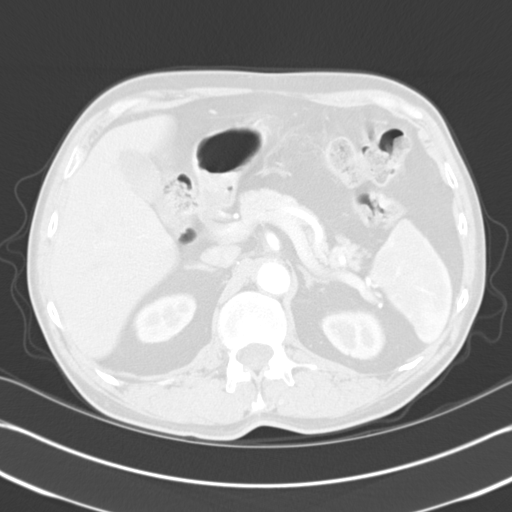
[im 15/69  lung]
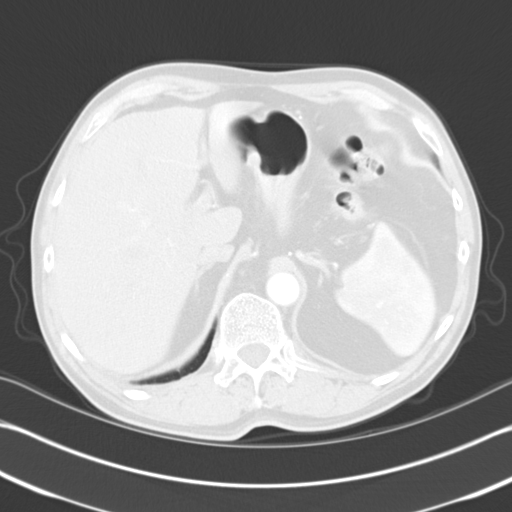
[im 20/69  lung]
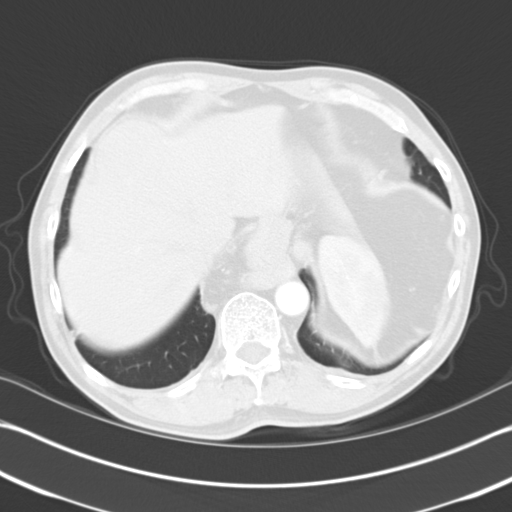
[im 25/69  mediastinal]
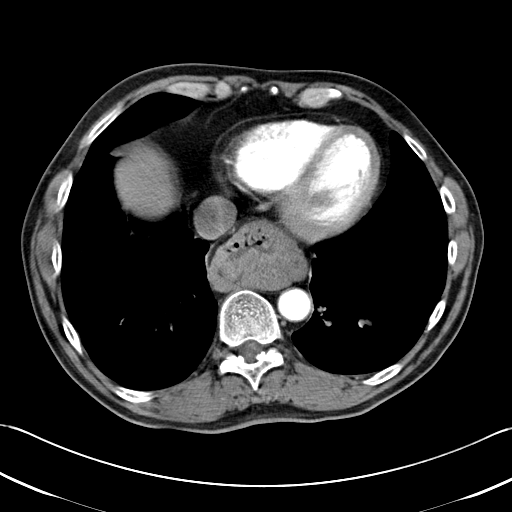
[im 25/69  lung]
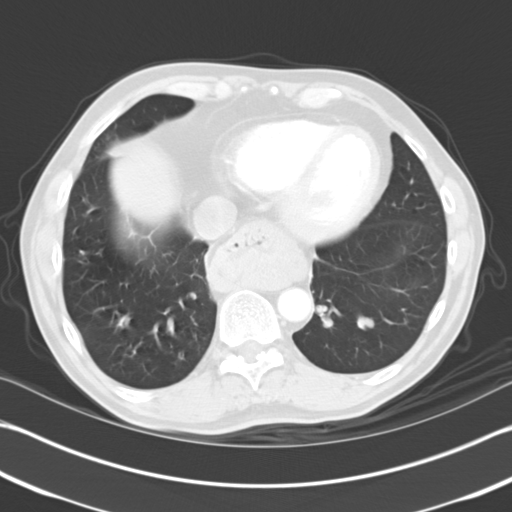
[im 30/69  lung]
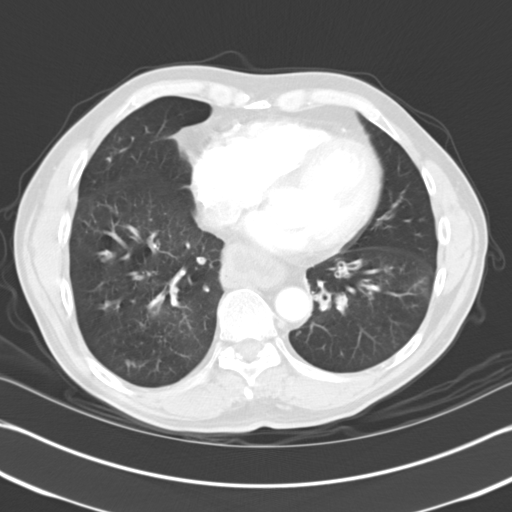
[im 39/69  lung]
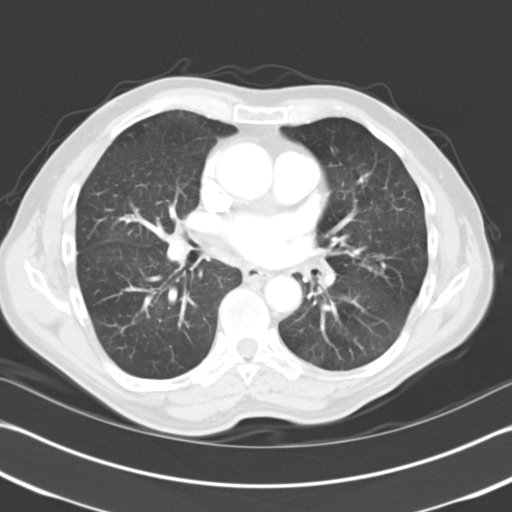
[im 44/69  lung]
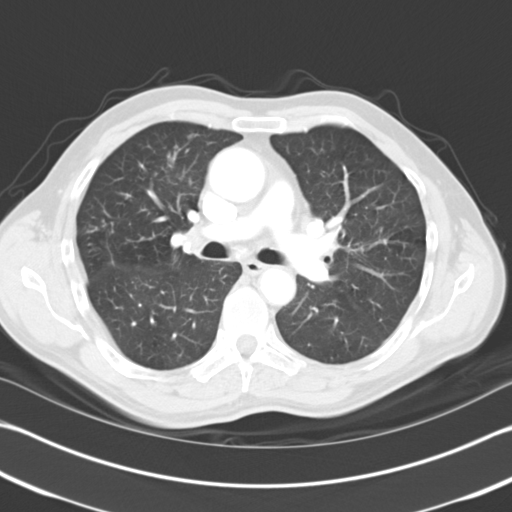
[im 49/69  mediastinal]
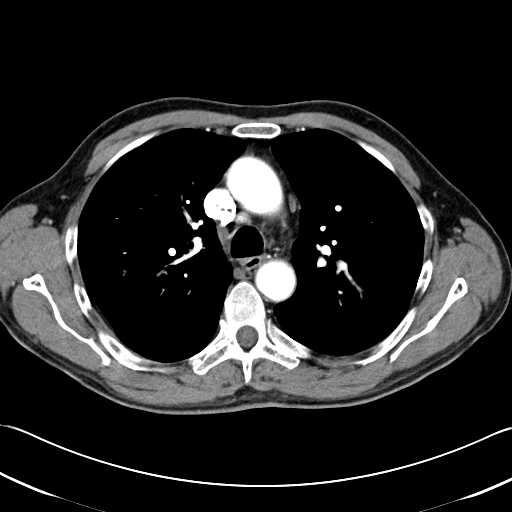
[im 49/69  lung]
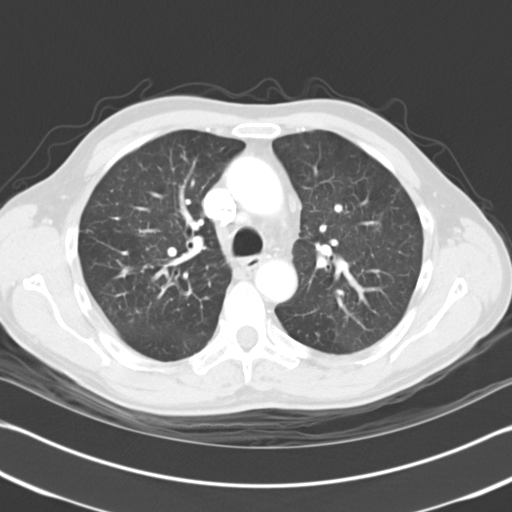
[im 54/69  lung]
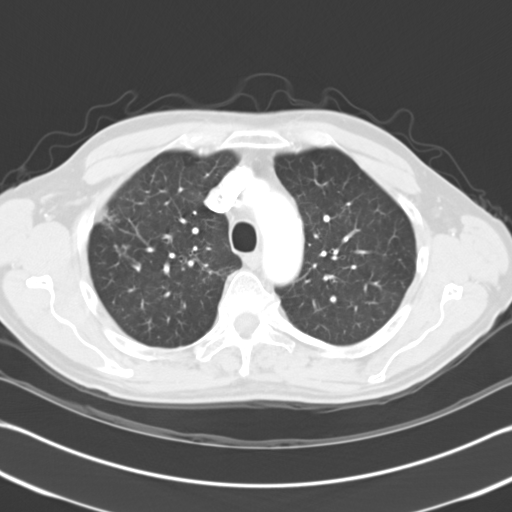
[im 59/69  lung]
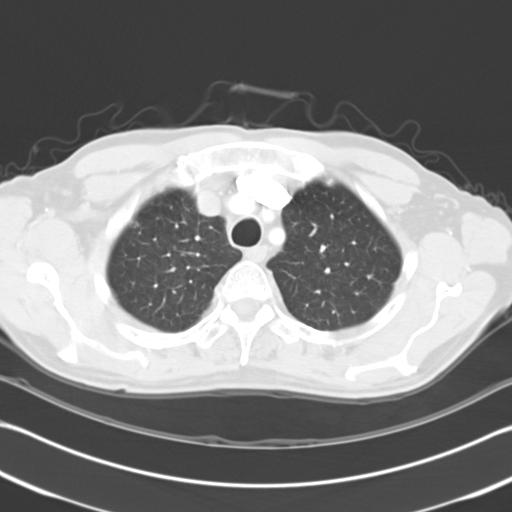
[im 64/69  lung]
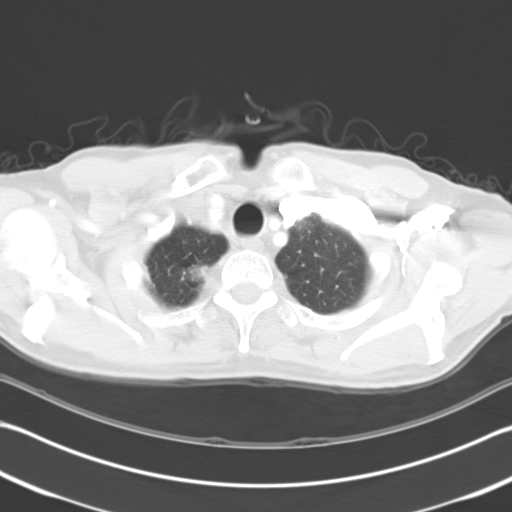

[Series 602: <mpr range> · coronal · 0.68mm/px · 3 of 39 slices shown]
[im 8/39  lung]
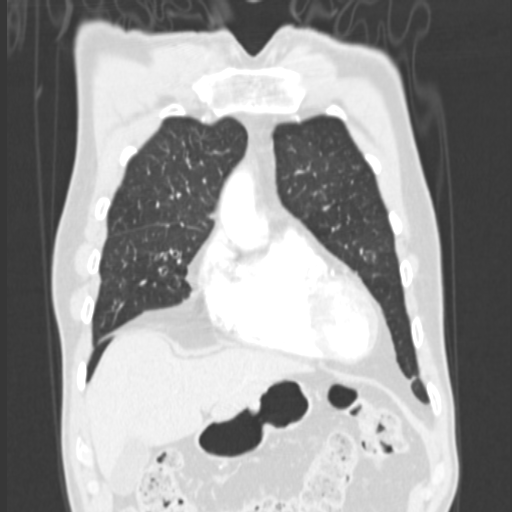
[im 16/39  lung]
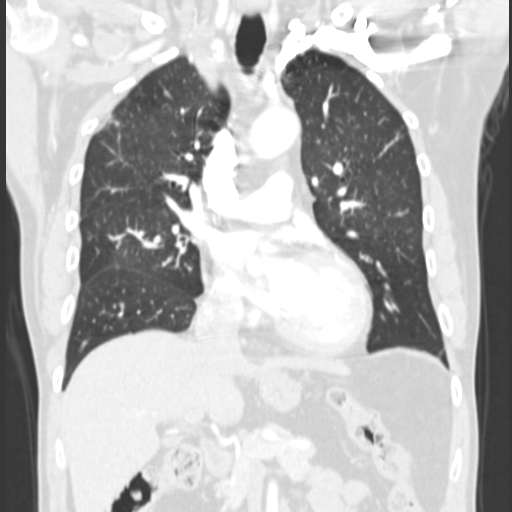
[im 23/39  lung]
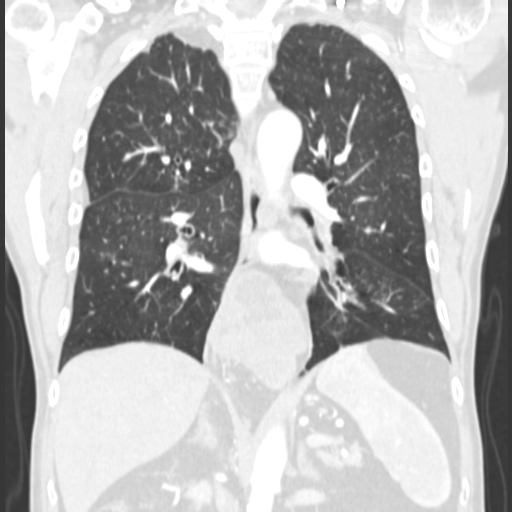

[15 of 36 positions shown; findings below may reference images not displayed]

FINDINGS: No enlarged mediastinal or hilar lymph nodes are present.  There is a moderate-sized hiatal hernia as noted previously.  There is no pleural or pericardial effusion.  Scattered vascular calcifications are noted.  
 On the lung windows, scattered areas of nodular parenchymal scarring are again noted without appreciable change.  The asymmetric subpleural density at the right apex appears stable.  There are no new pulmonary nodules.  There is diffuse central airway thickening with scattered bronchiectasis, particularly in the left lower lobe.  No acute findings are evident. 
 The imaged upper abdomen demonstrates no abnormality.  There is no adrenal mass.
IMPRESSION: 1.  Stable chronic lung disease with bronchiectasis, central airway thickening and scattered parenchymal scarring. 
 2.  No focally suspicious lung lesion, infiltrate or adenopathy.  
 3.  Stable moderate-sized hiatal hernia.

## 2006-07-20 ENCOUNTER — Ambulatory Visit: Payer: Self-pay | Admitting: Family Medicine

## 2006-07-27 ENCOUNTER — Ambulatory Visit: Payer: Self-pay | Admitting: Family Medicine

## 2006-09-26 ENCOUNTER — Ambulatory Visit: Payer: Self-pay | Admitting: Family Medicine

## 2006-10-10 ENCOUNTER — Ambulatory Visit: Payer: Self-pay | Admitting: Family Medicine

## 2006-10-17 ENCOUNTER — Ambulatory Visit: Payer: Self-pay

## 2006-11-22 ENCOUNTER — Ambulatory Visit: Payer: Self-pay | Admitting: Internal Medicine

## 2006-12-06 ENCOUNTER — Encounter: Payer: Self-pay | Admitting: Internal Medicine

## 2006-12-12 ENCOUNTER — Ambulatory Visit: Payer: Self-pay | Admitting: Family Medicine

## 2006-12-20 ENCOUNTER — Ambulatory Visit: Payer: Self-pay | Admitting: Internal Medicine

## 2007-01-19 ENCOUNTER — Ambulatory Visit: Payer: Self-pay | Admitting: Internal Medicine

## 2007-01-25 ENCOUNTER — Encounter: Payer: Self-pay | Admitting: Family Medicine

## 2007-01-25 DIAGNOSIS — J479 Bronchiectasis, uncomplicated: Secondary | ICD-10-CM | POA: Insufficient documentation

## 2007-01-25 DIAGNOSIS — J40 Bronchitis, not specified as acute or chronic: Secondary | ICD-10-CM | POA: Insufficient documentation

## 2007-02-13 ENCOUNTER — Telehealth: Payer: Self-pay | Admitting: *Deleted

## 2007-02-13 ENCOUNTER — Ambulatory Visit: Payer: Self-pay | Admitting: Family Medicine

## 2007-03-21 ENCOUNTER — Ambulatory Visit: Payer: Self-pay | Admitting: Internal Medicine

## 2007-05-08 ENCOUNTER — Telehealth: Payer: Self-pay | Admitting: Family Medicine

## 2007-05-29 ENCOUNTER — Ambulatory Visit: Payer: Self-pay | Admitting: Internal Medicine

## 2007-06-07 ENCOUNTER — Telehealth: Payer: Self-pay | Admitting: Family Medicine

## 2007-07-04 IMAGING — CR DG CHEST 2V
2 series · 2 of 2 positions shown · non-contrast
Comparison: none

HISTORY: Bronchitis

[view not recorded (1 of 2)]
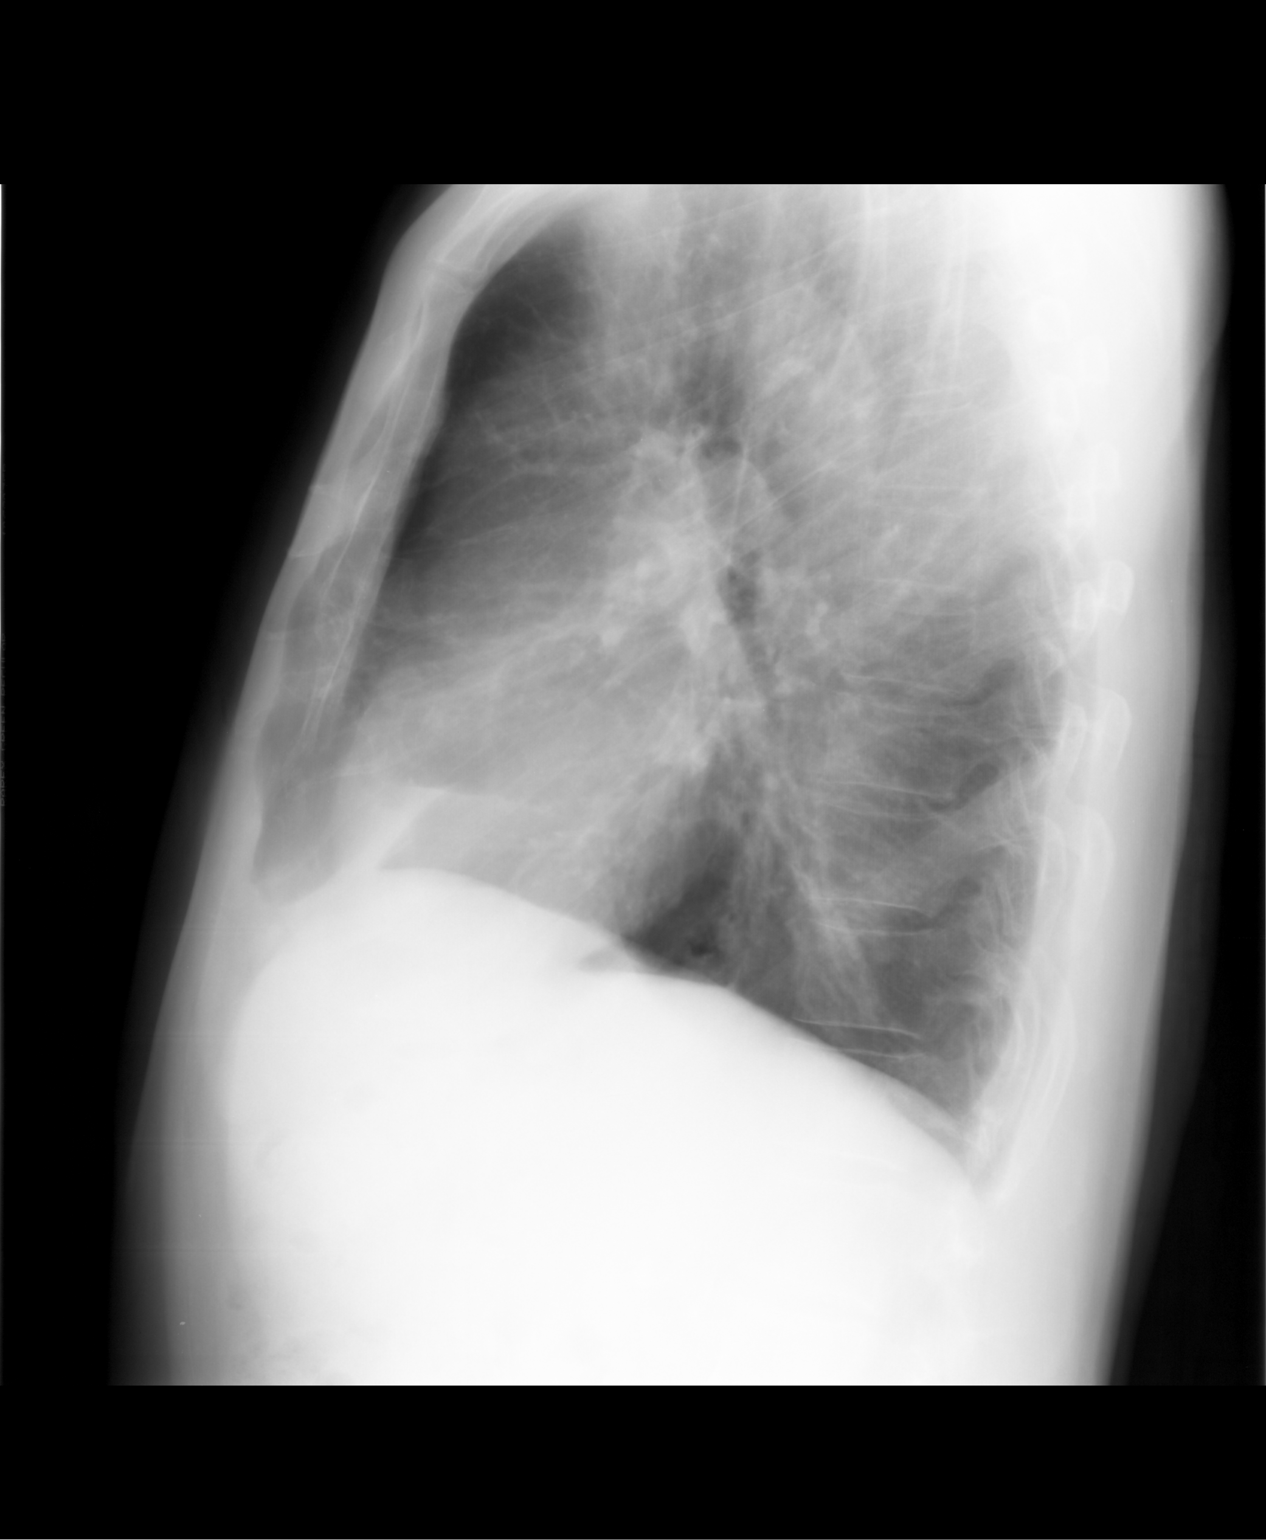

[view not recorded (2 of 2)]
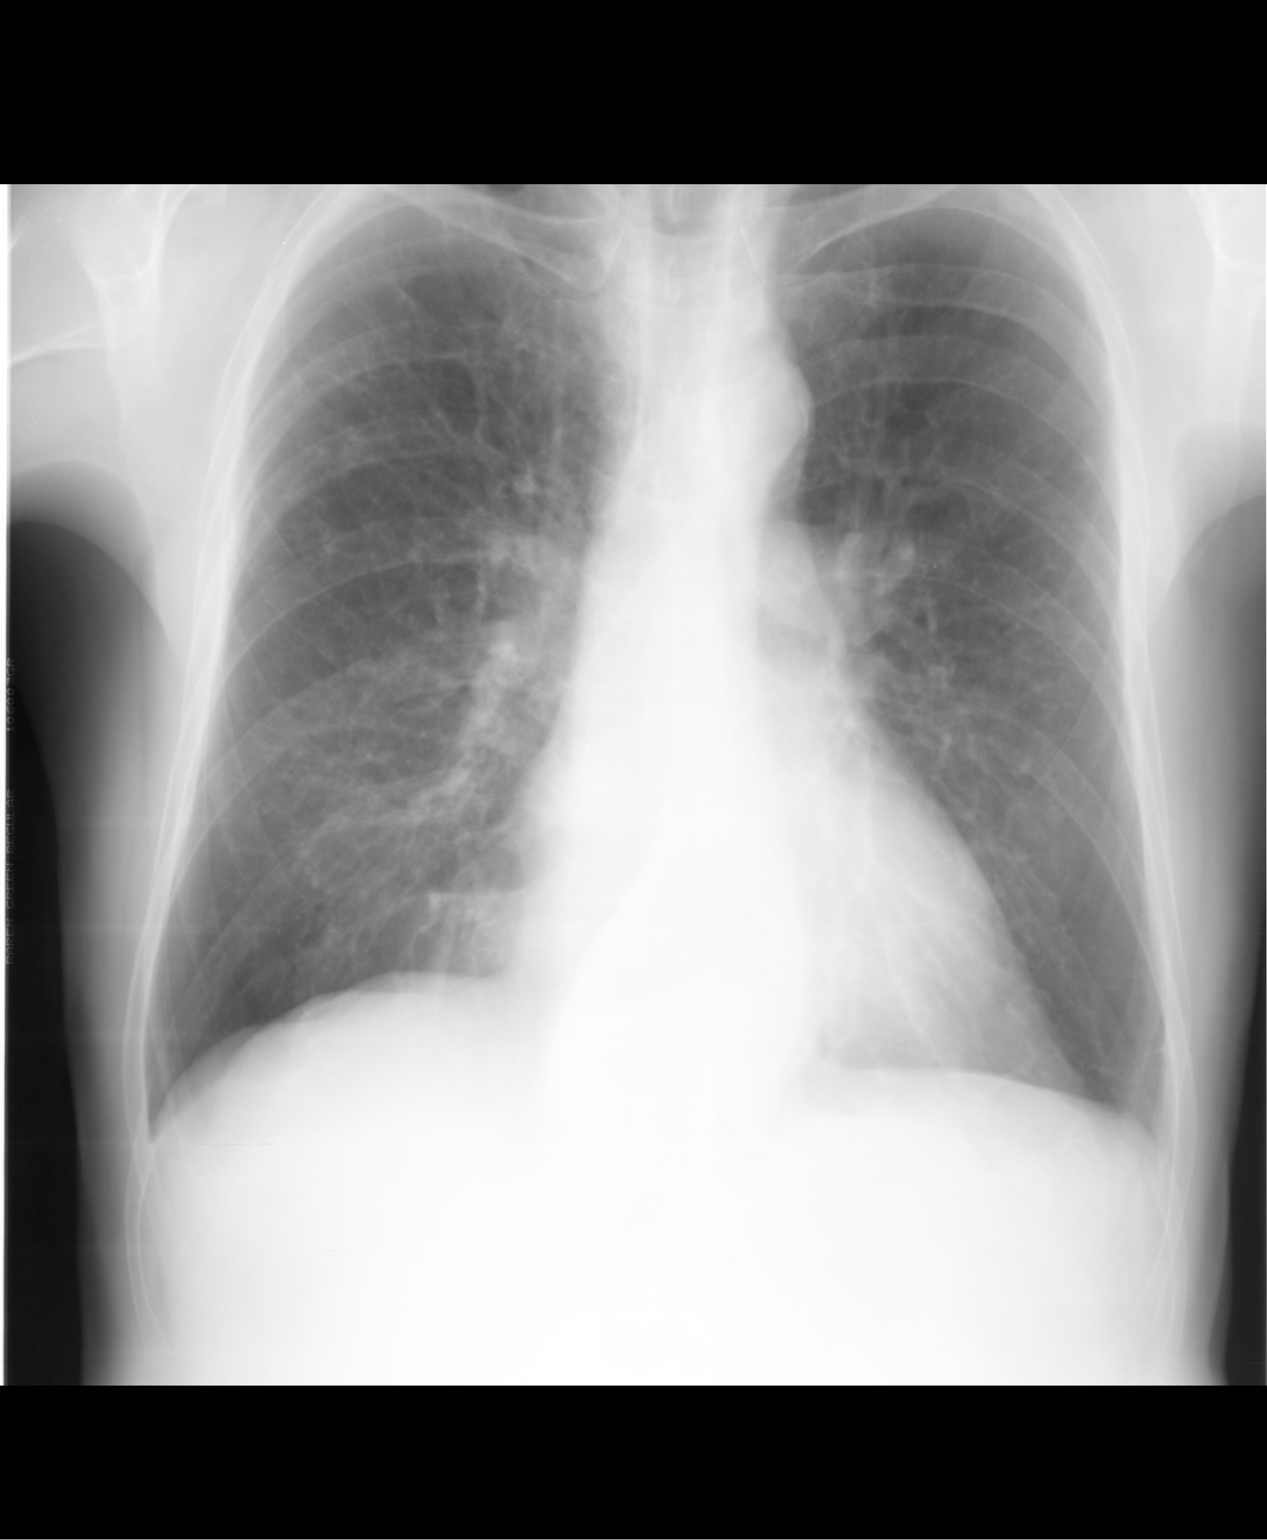

[2 of 2 positions shown; findings below may reference images not displayed]

CHEST 2 VIEWS:

Comparison 05/07/2004

Normal heart size and pulmonary vascularity.
Mildly tortuous aorta.
Small hiatal hernia.
Changes of COPD and chronic bronchitis with mild chronic interstitial changes in
both lungs, greatest in right upper lobe and at left base.
No definite developing mass, infiltrate, or effusion.
Diffuse bony demineralization.
IMPRESSION: COPD with chronic bronchitic and chronic interstitial lung disease changes.
Small hiatal hernia.

## 2007-08-15 ENCOUNTER — Ambulatory Visit: Payer: Self-pay | Admitting: Family Medicine

## 2007-10-09 ENCOUNTER — Ambulatory Visit: Payer: Self-pay | Admitting: Internal Medicine

## 2007-12-04 ENCOUNTER — Ambulatory Visit: Payer: Self-pay | Admitting: Family Medicine

## 2007-12-04 DIAGNOSIS — N400 Enlarged prostate without lower urinary tract symptoms: Secondary | ICD-10-CM

## 2007-12-04 DIAGNOSIS — J4489 Other specified chronic obstructive pulmonary disease: Secondary | ICD-10-CM | POA: Insufficient documentation

## 2007-12-04 DIAGNOSIS — J449 Chronic obstructive pulmonary disease, unspecified: Secondary | ICD-10-CM

## 2008-01-16 ENCOUNTER — Ambulatory Visit: Payer: Self-pay | Admitting: Family Medicine

## 2008-01-31 ENCOUNTER — Telehealth: Payer: Self-pay | Admitting: Family Medicine

## 2008-02-06 ENCOUNTER — Encounter: Admission: RE | Admit: 2008-02-06 | Discharge: 2008-02-06 | Payer: Self-pay | Admitting: Family Medicine

## 2008-02-07 ENCOUNTER — Ambulatory Visit: Payer: Self-pay | Admitting: Internal Medicine

## 2008-03-18 ENCOUNTER — Telehealth: Payer: Self-pay | Admitting: Family Medicine

## 2008-05-22 ENCOUNTER — Ambulatory Visit: Payer: Self-pay | Admitting: Family Medicine

## 2008-06-18 ENCOUNTER — Ambulatory Visit: Payer: Self-pay | Admitting: Family Medicine

## 2008-07-09 ENCOUNTER — Ambulatory Visit: Payer: Self-pay | Admitting: Internal Medicine

## 2008-07-17 ENCOUNTER — Ambulatory Visit: Payer: Self-pay | Admitting: Family Medicine

## 2008-09-02 ENCOUNTER — Ambulatory Visit: Payer: Self-pay | Admitting: Family Medicine

## 2008-09-17 IMAGING — US US ABDOMEN COMPLETE
1 series · 13 of 25 positions shown · non-contrast
Comparison: None

CLINICAL DATA: Abdominal pain and history of renal calculi

ABDOMEN ULTRASOUND
TECHNIQUE: Complete abdominal ultrasound examination was performed
including evaluation of the liver, gallbladder, bile ducts,
pancreas, kidneys, spleen, IVC, and abdominal aorta.

[Series 1: us abdomen complete · 0.24mm/px · 13 of 95 slices shown]
[im 1/95]
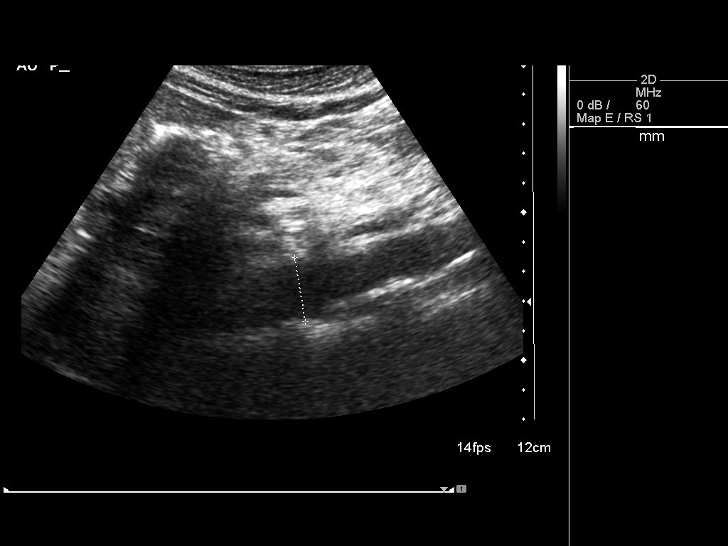
[im 8/95]
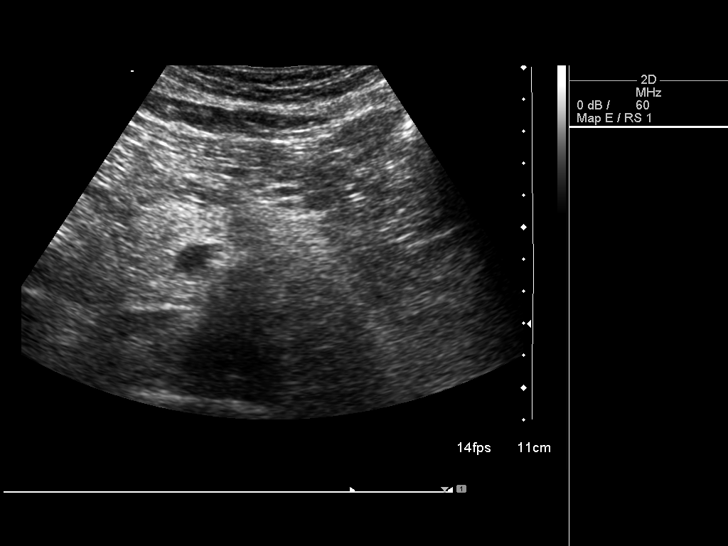
[im 16/95]
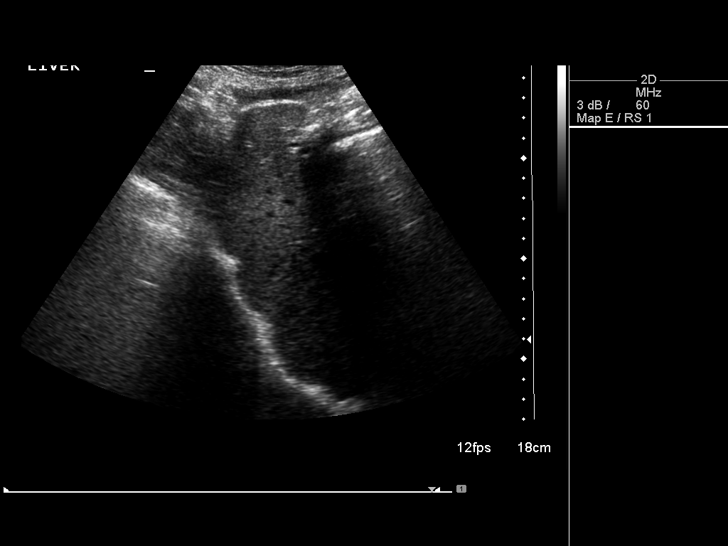
[im 24/95]
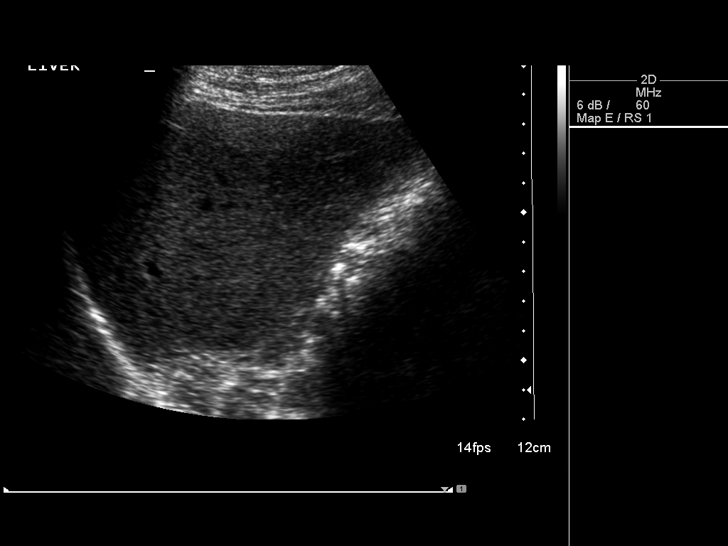
[im 32/95]
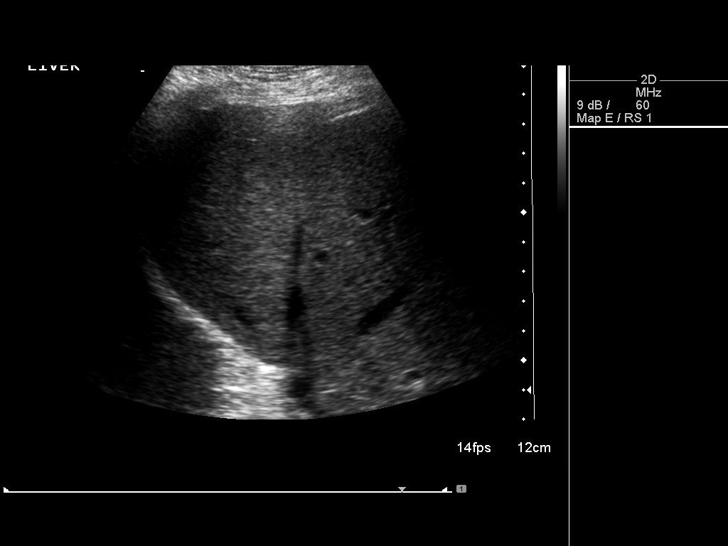
[im 40/95]
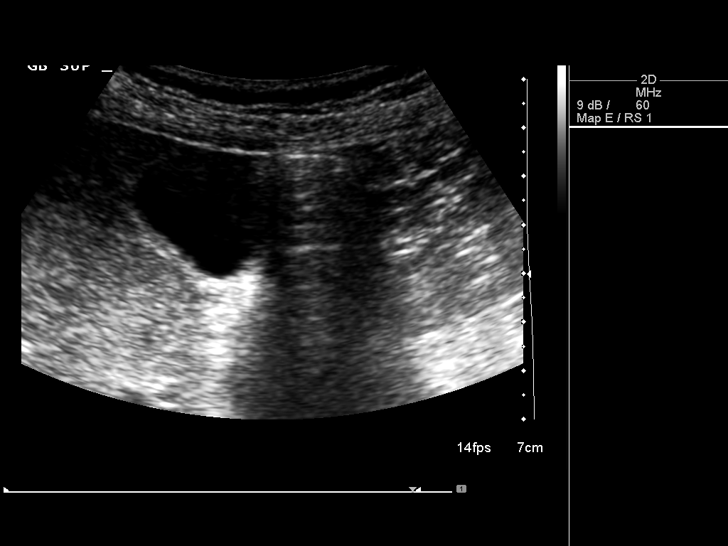
[im 48/95]
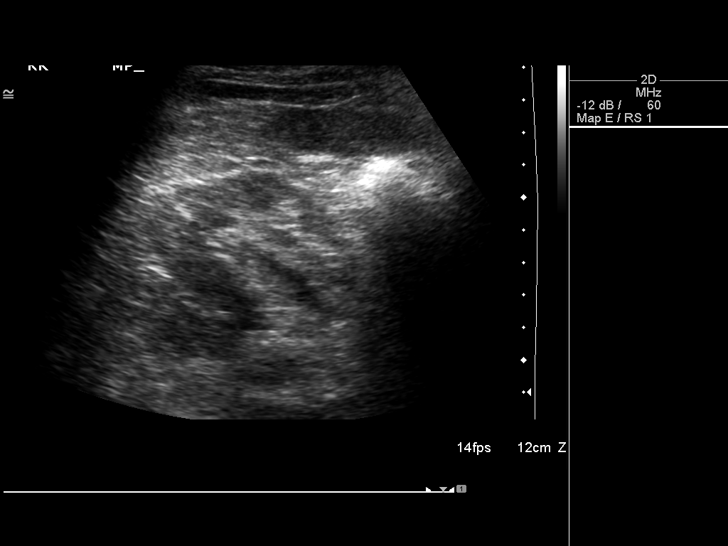
[im 55/95]
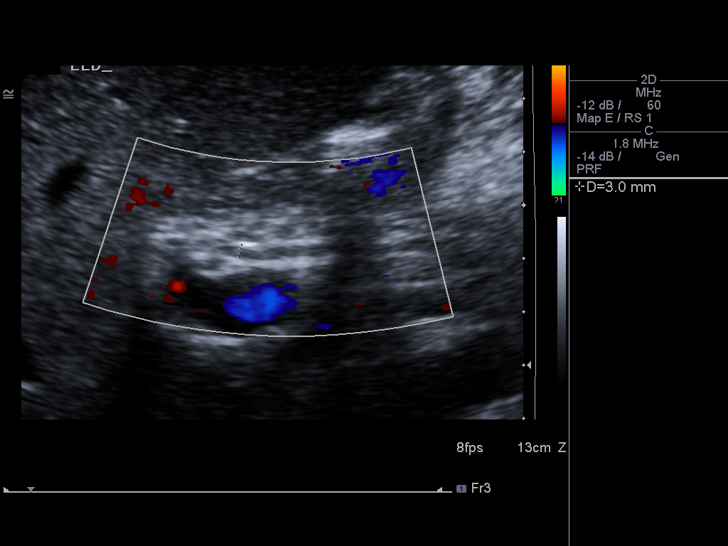
[im 63/95]
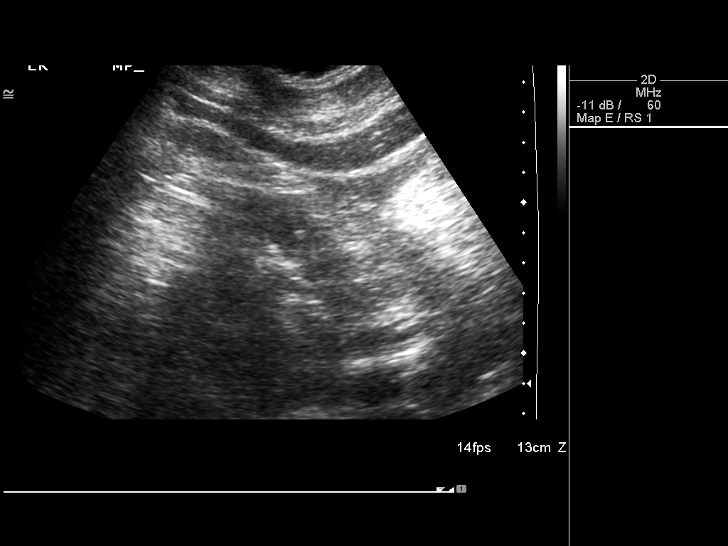
[im 71/95]
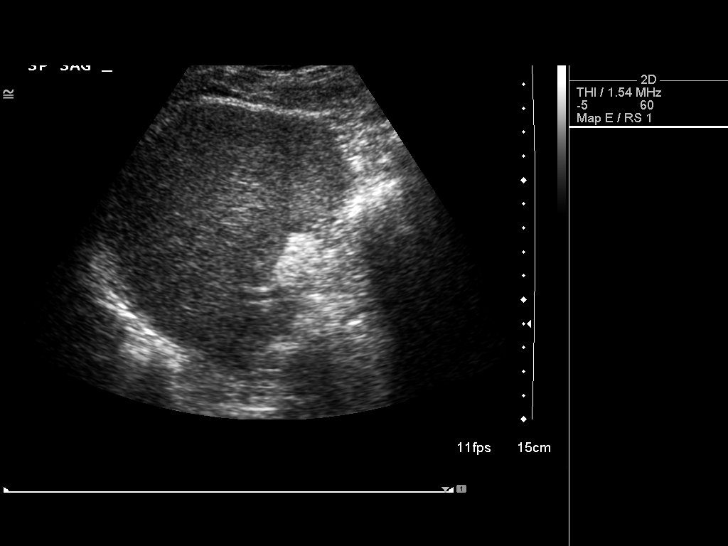
[im 79/95]
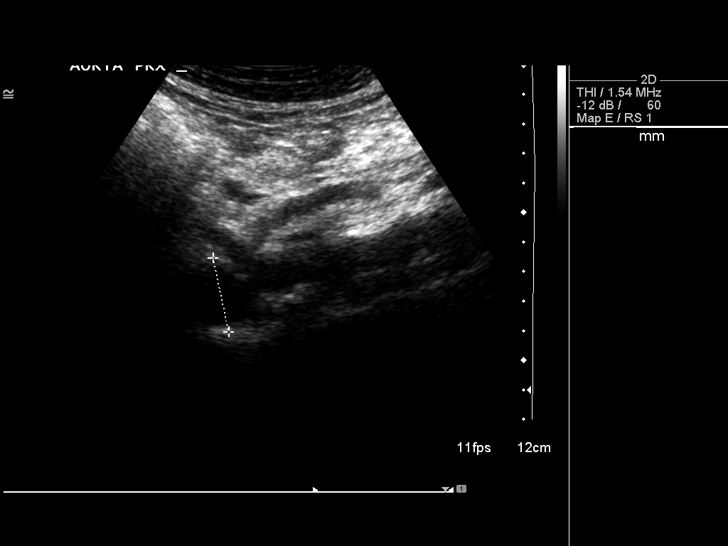
[im 87/95]
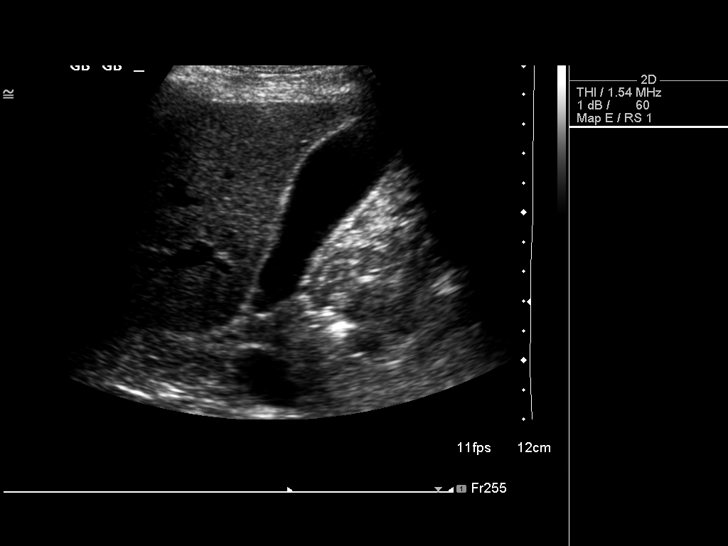
[im 95/95]
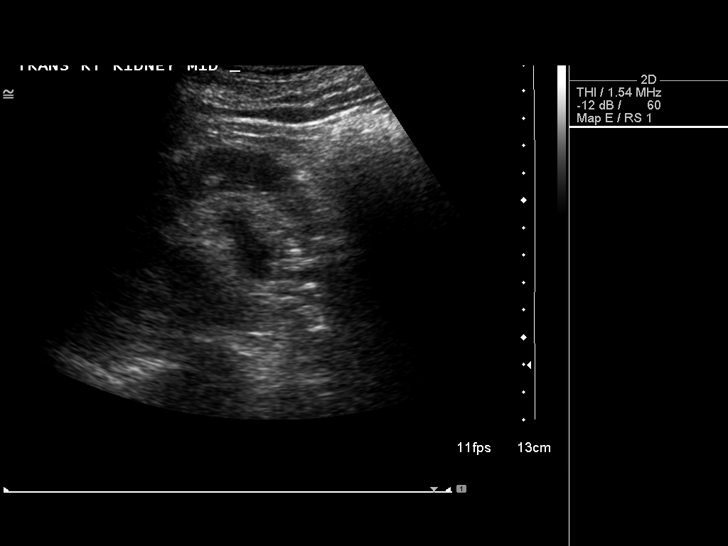

[13 of 25 positions shown; findings below may reference images not displayed]

FINDINGS: Gallbladder:  Normal appearance by ultrasound.  No evidence of
gallstones or gallbladder wall thickening.  No pericholecystic
fluid.

Common bile duct:  Normal caliber of 3 mm

Liver:  Normal appearance by ultrasound.  No biliary ductal
dilatation or focal lesions.

IVC:  Normally patent

Pancreas:  Very limited visualization by ultrasound.  No focal
abnormalities.

Spleen:  Other splenic length is normal measuring 9.6 cm, the
overall splenic volume is felt to be slightly enlarged.
Echotexture of the spleen is normal.

Kidneys:  The right kidney measures approximately 10.7 cm on the
left kidney 10.3 cm.  No overt hydronephrosis.  Both kidneys show
mild cortical atrophy as well as suggestion of increased cortical
echogenicity.  Correlation is suggested with renal function as
ultrasound appearance may reflect underlying chronic renal disease.

Abdominal aorta:  Proximal segment not visualized due to overlying
bowel gas.  The rest of the visualized aorta shows no aneurysmal
dilatation.
IMPRESSION: Normal gallbladder and bile ducts.  Mildly prominent spleen volume.
Appearance of kidneys suggesting chronic cortical atrophy and
increased cortical echogenicity.  Correlation is suggested with
renal function.

## 2008-11-13 ENCOUNTER — Ambulatory Visit: Payer: Self-pay | Admitting: Family Medicine

## 2008-11-13 DIAGNOSIS — D649 Anemia, unspecified: Secondary | ICD-10-CM

## 2008-11-13 DIAGNOSIS — E785 Hyperlipidemia, unspecified: Secondary | ICD-10-CM

## 2008-11-13 DIAGNOSIS — T50995A Adverse effect of other drugs, medicaments and biological substances, initial encounter: Secondary | ICD-10-CM | POA: Insufficient documentation

## 2008-11-13 DIAGNOSIS — E039 Hypothyroidism, unspecified: Secondary | ICD-10-CM

## 2008-11-13 DIAGNOSIS — E559 Vitamin D deficiency, unspecified: Secondary | ICD-10-CM | POA: Insufficient documentation

## 2008-11-24 ENCOUNTER — Ambulatory Visit: Payer: Self-pay | Admitting: Family Medicine

## 2008-11-24 LAB — CONVERTED CEMR LAB: OCCULT 3: NEGATIVE

## 2008-11-25 ENCOUNTER — Encounter: Payer: Self-pay | Admitting: Family Medicine

## 2009-04-14 ENCOUNTER — Ambulatory Visit: Payer: Self-pay | Admitting: Family Medicine

## 2009-04-28 ENCOUNTER — Telehealth: Payer: Self-pay | Admitting: Family Medicine

## 2009-05-28 ENCOUNTER — Ambulatory Visit: Payer: Self-pay | Admitting: Family Medicine

## 2009-07-08 ENCOUNTER — Ambulatory Visit: Payer: Self-pay | Admitting: Internal Medicine

## 2009-10-01 ENCOUNTER — Ambulatory Visit: Payer: Self-pay | Admitting: Family Medicine

## 2009-11-09 ENCOUNTER — Telehealth: Payer: Self-pay | Admitting: Internal Medicine

## 2009-11-25 ENCOUNTER — Ambulatory Visit: Payer: Self-pay | Admitting: Family Medicine

## 2009-11-25 DIAGNOSIS — R05 Cough: Secondary | ICD-10-CM

## 2009-12-14 ENCOUNTER — Telehealth: Payer: Self-pay | Admitting: Family Medicine

## 2009-12-14 ENCOUNTER — Emergency Department (HOSPITAL_COMMUNITY): Admission: EM | Admit: 2009-12-14 | Discharge: 2009-12-14 | Payer: Self-pay | Admitting: Emergency Medicine

## 2009-12-15 ENCOUNTER — Ambulatory Visit: Payer: Self-pay | Admitting: Family Medicine

## 2009-12-16 ENCOUNTER — Ambulatory Visit: Payer: Self-pay | Admitting: Family Medicine

## 2009-12-24 ENCOUNTER — Ambulatory Visit: Payer: Self-pay | Admitting: Family Medicine

## 2009-12-24 ENCOUNTER — Telehealth: Payer: Self-pay | Admitting: Family Medicine

## 2010-01-13 ENCOUNTER — Ambulatory Visit: Payer: Self-pay | Admitting: Family Medicine

## 2010-01-20 ENCOUNTER — Ambulatory Visit: Payer: Self-pay | Admitting: Internal Medicine

## 2010-02-17 ENCOUNTER — Ambulatory Visit: Payer: Self-pay | Admitting: Internal Medicine

## 2010-03-04 ENCOUNTER — Ambulatory Visit: Payer: Self-pay | Admitting: Family Medicine

## 2010-03-04 DIAGNOSIS — IMO0001 Reserved for inherently not codable concepts without codable children: Secondary | ICD-10-CM

## 2010-03-04 DIAGNOSIS — J019 Acute sinusitis, unspecified: Secondary | ICD-10-CM | POA: Insufficient documentation

## 2010-03-07 LAB — CONVERTED CEMR LAB: Total CK: 26 units/L (ref 7–232)

## 2010-03-10 ENCOUNTER — Ambulatory Visit: Payer: Self-pay | Admitting: Family Medicine

## 2010-03-10 DIAGNOSIS — M479 Spondylosis, unspecified: Secondary | ICD-10-CM | POA: Insufficient documentation

## 2010-03-10 DIAGNOSIS — K219 Gastro-esophageal reflux disease without esophagitis: Secondary | ICD-10-CM

## 2010-03-15 ENCOUNTER — Emergency Department (HOSPITAL_COMMUNITY): Admission: EM | Admit: 2010-03-15 | Discharge: 2010-03-15 | Payer: Self-pay | Admitting: Emergency Medicine

## 2010-03-18 ENCOUNTER — Ambulatory Visit: Payer: Self-pay | Admitting: Family Medicine

## 2010-03-18 DIAGNOSIS — J159 Unspecified bacterial pneumonia: Secondary | ICD-10-CM | POA: Insufficient documentation

## 2010-03-23 ENCOUNTER — Telehealth: Payer: Self-pay | Admitting: Family Medicine

## 2010-03-24 ENCOUNTER — Ambulatory Visit: Payer: Self-pay | Admitting: Internal Medicine

## 2010-03-26 ENCOUNTER — Telehealth (INDEPENDENT_AMBULATORY_CARE_PROVIDER_SITE_OTHER): Payer: Self-pay | Admitting: *Deleted

## 2010-03-26 ENCOUNTER — Inpatient Hospital Stay (HOSPITAL_COMMUNITY): Admission: EM | Admit: 2010-03-26 | Discharge: 2010-04-21 | Payer: Self-pay | Admitting: Emergency Medicine

## 2010-03-26 ENCOUNTER — Ambulatory Visit: Payer: Self-pay | Admitting: Internal Medicine

## 2010-03-29 ENCOUNTER — Encounter (INDEPENDENT_AMBULATORY_CARE_PROVIDER_SITE_OTHER): Payer: Self-pay | Admitting: *Deleted

## 2010-03-30 ENCOUNTER — Encounter (INDEPENDENT_AMBULATORY_CARE_PROVIDER_SITE_OTHER): Payer: Self-pay | Admitting: Internal Medicine

## 2010-03-30 ENCOUNTER — Telehealth: Payer: Self-pay | Admitting: Family Medicine

## 2010-03-31 ENCOUNTER — Encounter (INDEPENDENT_AMBULATORY_CARE_PROVIDER_SITE_OTHER): Payer: Self-pay | Admitting: Internal Medicine

## 2010-04-02 ENCOUNTER — Ambulatory Visit: Payer: Self-pay | Admitting: Thoracic Surgery

## 2010-04-07 ENCOUNTER — Encounter: Payer: Self-pay | Admitting: Emergency Medicine

## 2010-04-09 ENCOUNTER — Encounter: Payer: Self-pay | Admitting: Pulmonary Disease

## 2010-04-16 ENCOUNTER — Ambulatory Visit: Payer: Self-pay | Admitting: Physical Medicine & Rehabilitation

## 2010-04-16 ENCOUNTER — Ambulatory Visit: Payer: Self-pay | Admitting: Infectious Diseases

## 2010-04-21 ENCOUNTER — Telehealth (INDEPENDENT_AMBULATORY_CARE_PROVIDER_SITE_OTHER): Payer: Self-pay | Admitting: *Deleted

## 2010-04-22 ENCOUNTER — Telehealth (INDEPENDENT_AMBULATORY_CARE_PROVIDER_SITE_OTHER): Payer: Self-pay | Admitting: *Deleted

## 2010-04-25 ENCOUNTER — Inpatient Hospital Stay (HOSPITAL_COMMUNITY): Admission: EM | Admit: 2010-04-25 | Discharge: 2010-05-03 | Payer: Self-pay | Admitting: Emergency Medicine

## 2010-04-25 ENCOUNTER — Ambulatory Visit: Payer: Self-pay | Admitting: Pulmonary Disease

## 2010-05-01 ENCOUNTER — Ambulatory Visit: Payer: Self-pay | Admitting: Thoracic Surgery

## 2010-05-03 ENCOUNTER — Telehealth (INDEPENDENT_AMBULATORY_CARE_PROVIDER_SITE_OTHER): Payer: Self-pay | Admitting: *Deleted

## 2010-05-04 ENCOUNTER — Telehealth: Payer: Self-pay | Admitting: Internal Medicine

## 2010-05-06 ENCOUNTER — Telehealth: Payer: Self-pay | Admitting: Internal Medicine

## 2010-05-10 ENCOUNTER — Ambulatory Visit: Payer: Self-pay | Admitting: Internal Medicine

## 2010-05-10 DIAGNOSIS — M3130 Wegener's granulomatosis without renal involvement: Secondary | ICD-10-CM | POA: Insufficient documentation

## 2010-05-11 LAB — CONVERTED CEMR LAB
BUN: 28 mg/dL — ABNORMAL HIGH (ref 6–23)
Calcium: 8 mg/dL — ABNORMAL LOW (ref 8.4–10.5)
Creatinine, Ser: 1.2 mg/dL (ref 0.4–1.5)
Eosinophils Relative: 0.2 % (ref 0.0–5.0)
GFR calc non Af Amer: 62.67 mL/min (ref >60)
HCT: 29.5 % — ABNORMAL LOW (ref 39.0–52.0)
Lymphocytes Relative: 7.6 % — ABNORMAL LOW (ref 12.0–46.0)
Monocytes Relative: 5.1 % (ref 3.0–12.0)
Neutrophils Relative %: 87 % — ABNORMAL HIGH (ref 43.0–77.0)
Platelets: 560 10*3/uL — ABNORMAL HIGH (ref 150.0–400.0)
Potassium: 5.6 meq/L — ABNORMAL HIGH (ref 3.5–5.1)
WBC: 10.8 10*3/uL — ABNORMAL HIGH (ref 4.5–10.5)

## 2010-05-12 ENCOUNTER — Encounter: Admission: RE | Admit: 2010-05-12 | Discharge: 2010-05-12 | Payer: Self-pay | Admitting: Thoracic Surgery

## 2010-05-12 ENCOUNTER — Ambulatory Visit: Payer: Self-pay | Admitting: Thoracic Surgery

## 2010-05-12 ENCOUNTER — Inpatient Hospital Stay (HOSPITAL_COMMUNITY): Admission: EM | Admit: 2010-05-12 | Discharge: 2010-05-17 | Payer: Self-pay | Admitting: Emergency Medicine

## 2010-05-13 ENCOUNTER — Telehealth (INDEPENDENT_AMBULATORY_CARE_PROVIDER_SITE_OTHER): Payer: Self-pay | Admitting: *Deleted

## 2010-05-18 ENCOUNTER — Encounter: Payer: Self-pay | Admitting: Internal Medicine

## 2010-05-20 ENCOUNTER — Encounter: Payer: Self-pay | Admitting: Internal Medicine

## 2010-05-24 ENCOUNTER — Ambulatory Visit: Payer: Self-pay | Admitting: Internal Medicine

## 2010-05-25 DIAGNOSIS — J939 Pneumothorax, unspecified: Secondary | ICD-10-CM | POA: Insufficient documentation

## 2010-05-25 DIAGNOSIS — J930 Spontaneous tension pneumothorax: Secondary | ICD-10-CM | POA: Insufficient documentation

## 2010-05-26 ENCOUNTER — Encounter: Payer: Self-pay | Admitting: Internal Medicine

## 2010-05-27 ENCOUNTER — Ambulatory Visit: Payer: Self-pay | Admitting: Internal Medicine

## 2010-06-01 ENCOUNTER — Encounter: Admission: RE | Admit: 2010-06-01 | Discharge: 2010-06-01 | Payer: Self-pay | Admitting: Thoracic Surgery

## 2010-06-01 ENCOUNTER — Ambulatory Visit: Payer: Self-pay | Admitting: Thoracic Surgery

## 2010-06-09 ENCOUNTER — Ambulatory Visit: Payer: Self-pay | Admitting: Internal Medicine

## 2010-06-09 DIAGNOSIS — R079 Chest pain, unspecified: Secondary | ICD-10-CM

## 2010-06-10 LAB — CONVERTED CEMR LAB
AST: 86 units/L — ABNORMAL HIGH (ref 0–37)
Albumin: 2.3 g/dL — ABNORMAL LOW (ref 3.5–5.2)
Basophils Absolute: 0.1 10*3/uL (ref 0.0–0.1)
Basophils Relative: 0.4 % (ref 0.0–3.0)
CO2: 27 meq/L (ref 19–32)
Chloride: 101 meq/L (ref 96–112)
Eosinophils Absolute: 0.1 10*3/uL (ref 0.0–0.7)
Glucose, Bld: 98 mg/dL (ref 70–99)
HCT: 27.7 % — ABNORMAL LOW (ref 39.0–52.0)
Hemoglobin: 9 g/dL — ABNORMAL LOW (ref 13.0–17.0)
Ketones, ur: NEGATIVE mg/dL
Leukocytes, UA: NEGATIVE
Lymphs Abs: 0.5 10*3/uL — ABNORMAL LOW (ref 0.7–4.0)
MCHC: 32.5 g/dL (ref 30.0–36.0)
Neutro Abs: 10.7 10*3/uL — ABNORMAL HIGH (ref 1.4–7.7)
Potassium: 5.5 meq/L — ABNORMAL HIGH (ref 3.5–5.1)
RBC: 3.03 M/uL — ABNORMAL LOW (ref 4.22–5.81)
RDW: 19 % — ABNORMAL HIGH (ref 11.5–14.6)
Sed Rate: 67 mm/hr — ABNORMAL HIGH (ref 0–22)
Sodium: 137 meq/L (ref 135–145)
Specific Gravity, Urine: 1.02 (ref 1.000–1.030)
Total Protein: 5.4 g/dL — ABNORMAL LOW (ref 6.0–8.3)
Urine Glucose: NEGATIVE mg/dL
Urobilinogen, UA: 0.2 (ref 0.0–1.0)

## 2010-06-14 ENCOUNTER — Telehealth (INDEPENDENT_AMBULATORY_CARE_PROVIDER_SITE_OTHER): Payer: Self-pay | Admitting: *Deleted

## 2010-06-21 ENCOUNTER — Ambulatory Visit: Payer: Self-pay | Admitting: Internal Medicine

## 2010-06-21 DIAGNOSIS — R0602 Shortness of breath: Secondary | ICD-10-CM | POA: Insufficient documentation

## 2010-06-30 ENCOUNTER — Telehealth (INDEPENDENT_AMBULATORY_CARE_PROVIDER_SITE_OTHER): Payer: Self-pay | Admitting: Radiology

## 2010-07-05 ENCOUNTER — Encounter: Payer: Self-pay | Admitting: Internal Medicine

## 2010-07-05 ENCOUNTER — Encounter (HOSPITAL_COMMUNITY)
Admission: RE | Admit: 2010-07-05 | Discharge: 2010-09-07 | Payer: Self-pay | Source: Home / Self Care | Attending: Internal Medicine | Admitting: Internal Medicine

## 2010-07-05 ENCOUNTER — Ambulatory Visit: Payer: Self-pay

## 2010-07-05 ENCOUNTER — Ambulatory Visit: Payer: Self-pay | Admitting: Internal Medicine

## 2010-07-14 ENCOUNTER — Telehealth: Payer: Self-pay | Admitting: Internal Medicine

## 2010-07-21 ENCOUNTER — Ambulatory Visit: Payer: Self-pay | Admitting: Internal Medicine

## 2010-07-21 LAB — CONVERTED CEMR LAB
Basophils Relative: 0.2 % (ref 0.0–3.0)
CO2: 29 meq/L (ref 19–32)
Chloride: 103 meq/L (ref 96–112)
Creatinine, Ser: 1.2 mg/dL (ref 0.4–1.5)
Eosinophils Absolute: 0.1 10*3/uL (ref 0.0–0.7)
Eosinophils Relative: 0.8 % (ref 0.0–5.0)
HCT: 28.7 % — ABNORMAL LOW (ref 39.0–52.0)
Hemoglobin: 9.4 g/dL — ABNORMAL LOW (ref 13.0–17.0)
Lymphs Abs: 0.4 10*3/uL — ABNORMAL LOW (ref 0.7–4.0)
MCHC: 32.8 g/dL (ref 30.0–36.0)
MCV: 95.5 fL (ref 78.0–100.0)
Monocytes Absolute: 0.7 10*3/uL (ref 0.1–1.0)
Neutro Abs: 7.9 10*3/uL — ABNORMAL HIGH (ref 1.4–7.7)
Potassium: 5 meq/L (ref 3.5–5.1)
RBC: 3 M/uL — ABNORMAL LOW (ref 4.22–5.81)
Sodium: 141 meq/L (ref 135–145)
WBC: 9.1 10*3/uL (ref 4.5–10.5)

## 2010-07-26 IMAGING — CR DG CHEST 1V
1 series · 1 of 1 positions shown · non-contrast
Comparison: 11/22/2006

CLINICAL DATA: Hemoptysis.

CHEST - 1 VIEW

[w chest pa]
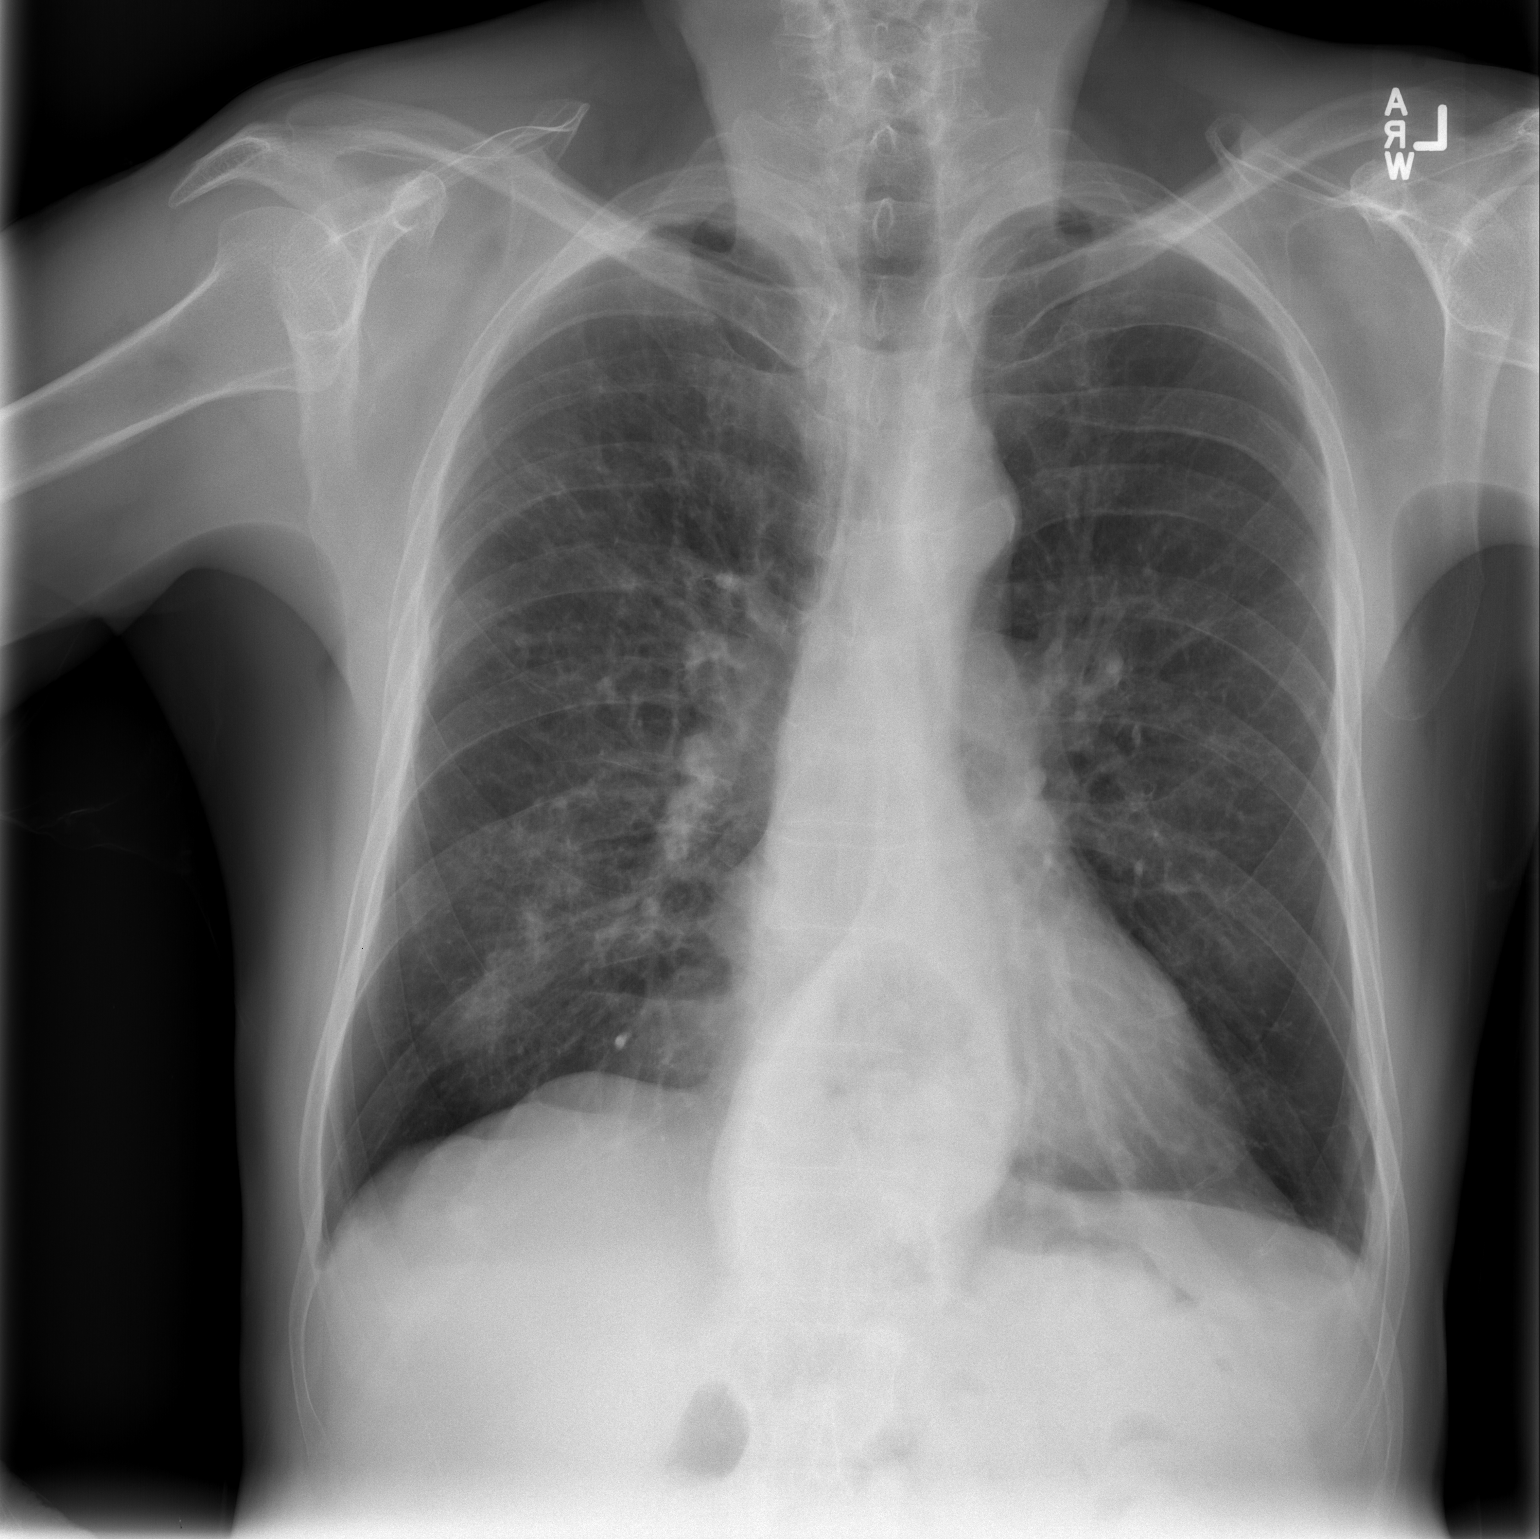

[1 of 1 positions shown; findings below may reference images not displayed]

FINDINGS: The cardiac silhouette, mediastinal and hilar contours
are within normal limits and stable.  A moderate sized hiatal
hernia is again demonstrated.  There are chronic bronchitic type
lung changes and COPD.  Ill-defined vague density in the right
lower lobe may reflect a developing infiltrate.  No pleural
effusion.  No pneumothorax.  Stable densities in the left lung apex
are likely scarring changes.
IMPRESSION: 1.  Probable early/developing right lower lobe infiltrate.
2.  Chronic lung changes/COPD.
3.  Stable moderate sized hiatal hernia.

## 2010-07-26 IMAGING — CT CT CHEST W/ CM
1 of 2 series · 14 of 32 positions shown, 18 images · IV contrast (agent unspecified)
Comparison: CT 12/01/2005, chest x-ray 12/14/2009.

CLINICAL DATA: Hemoptysis.

CT CHEST WITH CONTRAST
TECHNIQUE: Multidetector CT imaging of the chest was performed
following the standard protocol during bolus administration of
intravenous contrast.
Contrast: 80 ml Kmnipaque-KCC IV.

[Series 2: rtn chest with st · axial · 0.64mm/px · z∈[-310,-35]mm · 14 of 67 slices shown, 18 images]
[im 6/67  mediastinal]
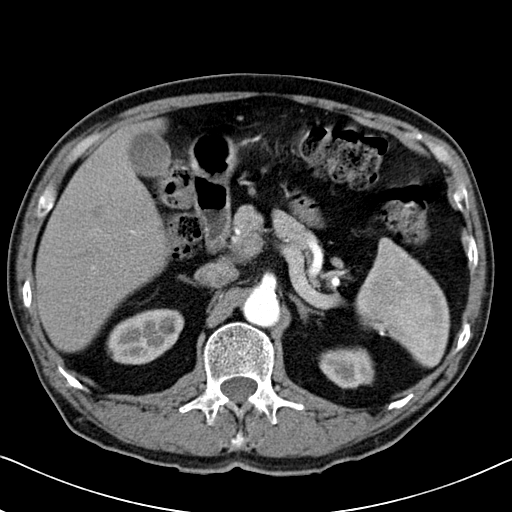
[im 6/67  lung]
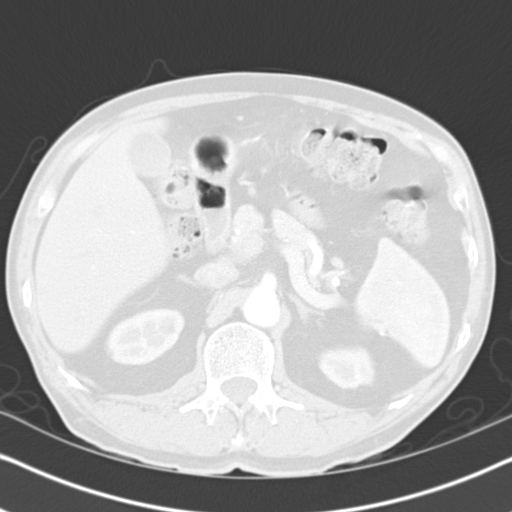
[im 11/67  lung]
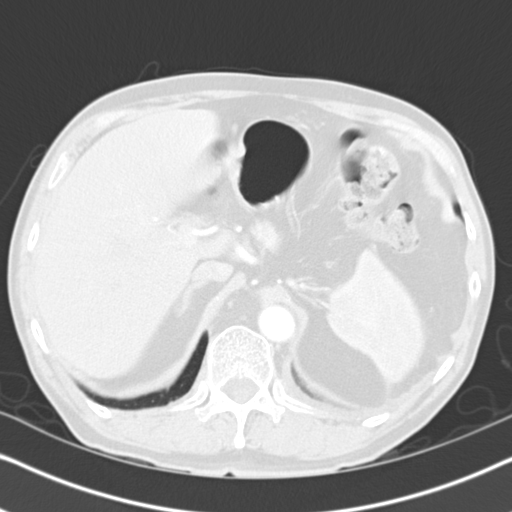
[im 16/67  lung]
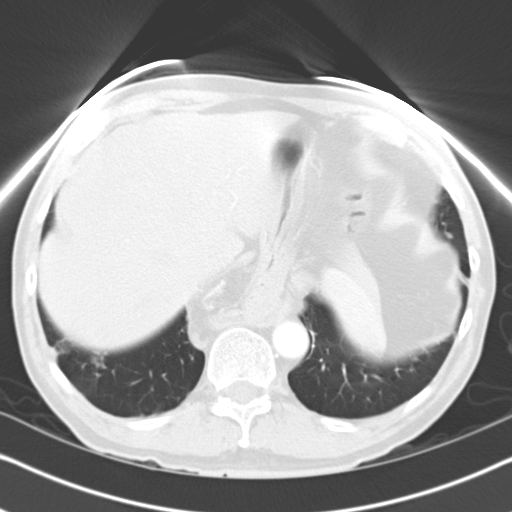
[im 21/67  lung]
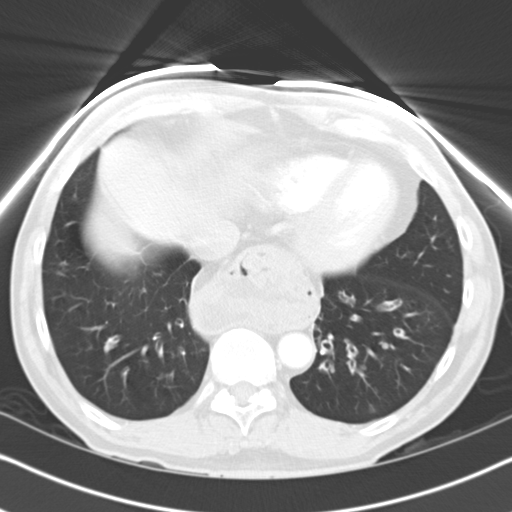
[im 26/67  mediastinal]
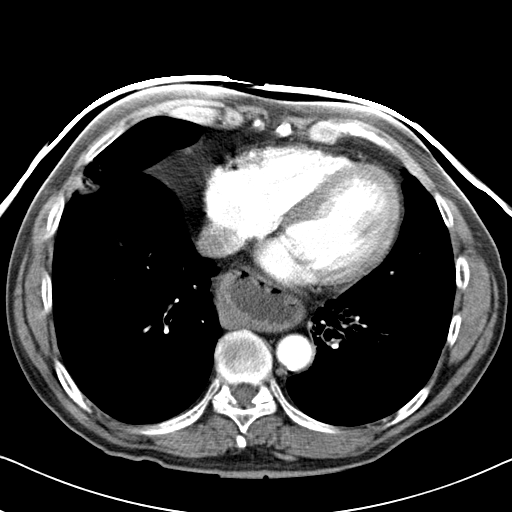
[im 26/67  lung]
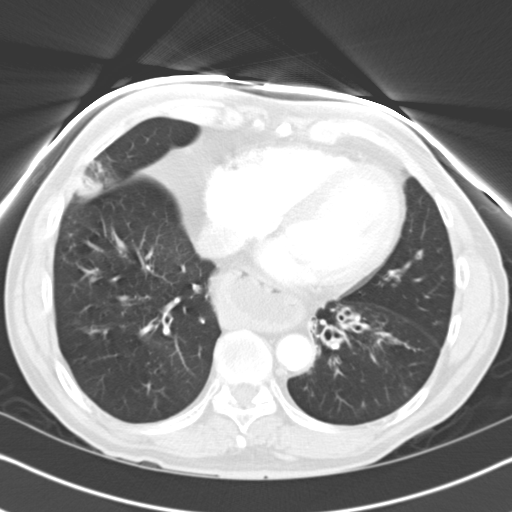
[im 31/67  lung]
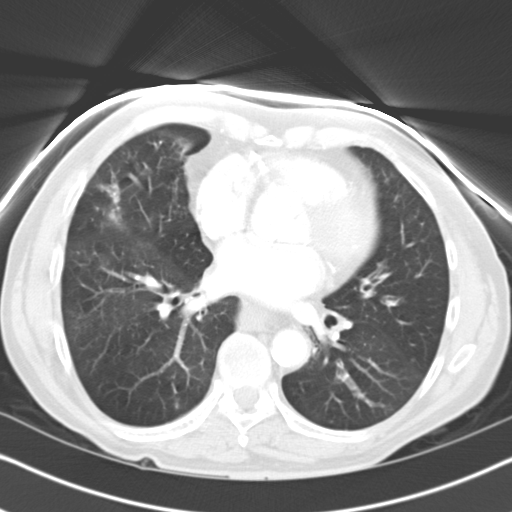
[im 32/67  lung]
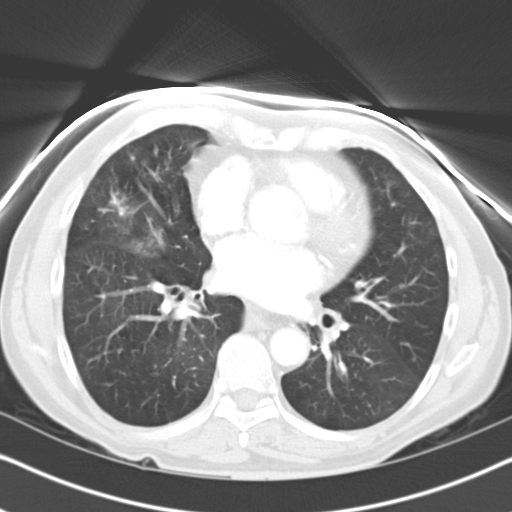
[im 34/67  lung]
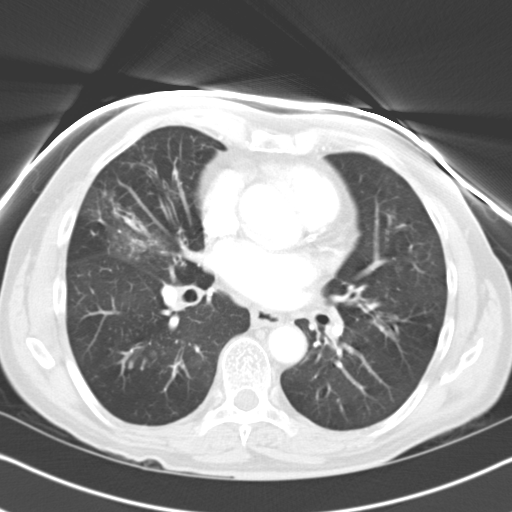
[im 36/67  mediastinal]
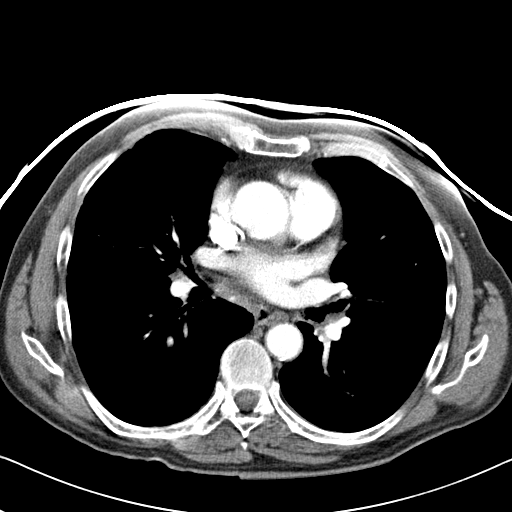
[im 36/67  lung]
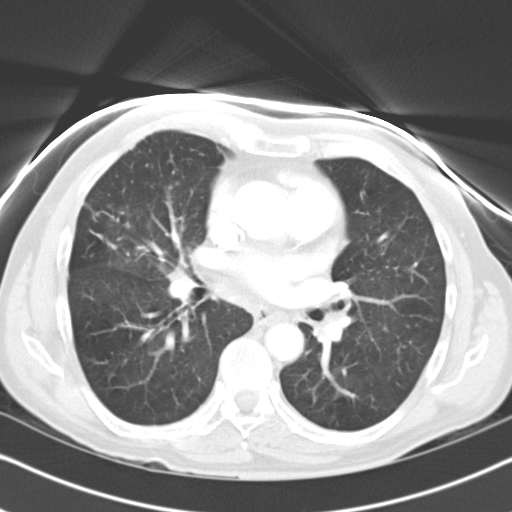
[im 41/67  lung]
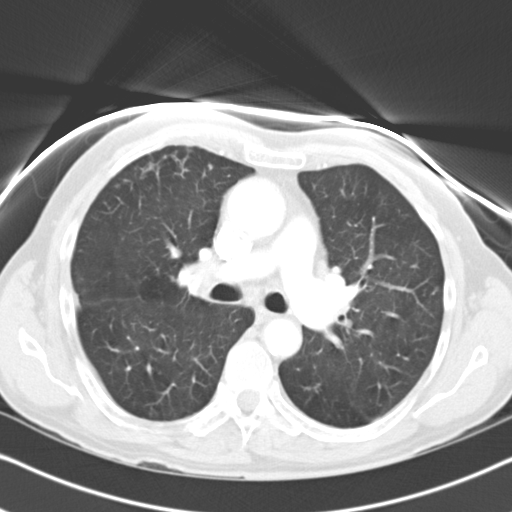
[im 46/67  lung]
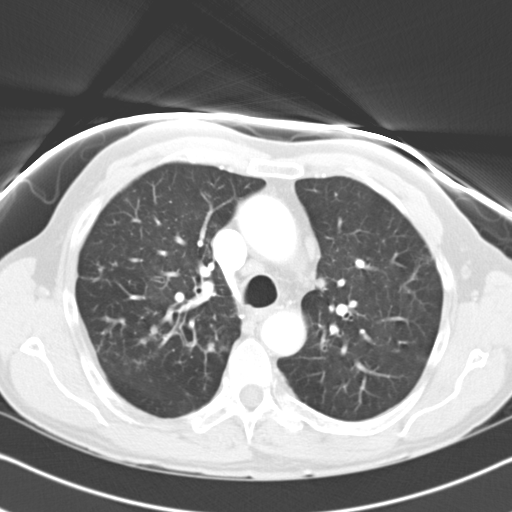
[im 51/67  lung]
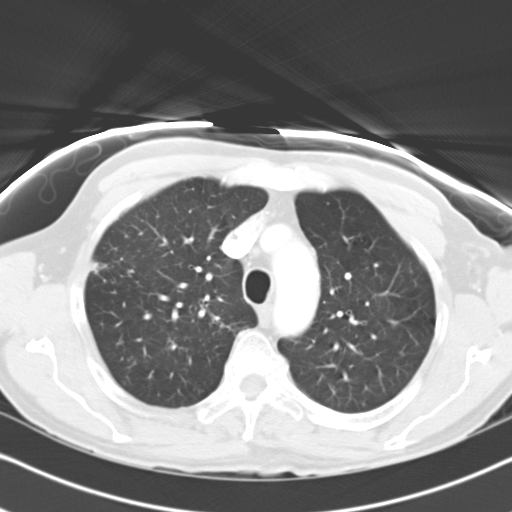
[im 56/67  mediastinal]
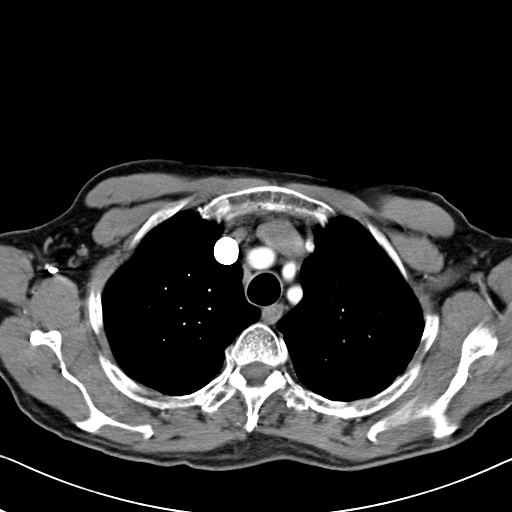
[im 56/67  lung]
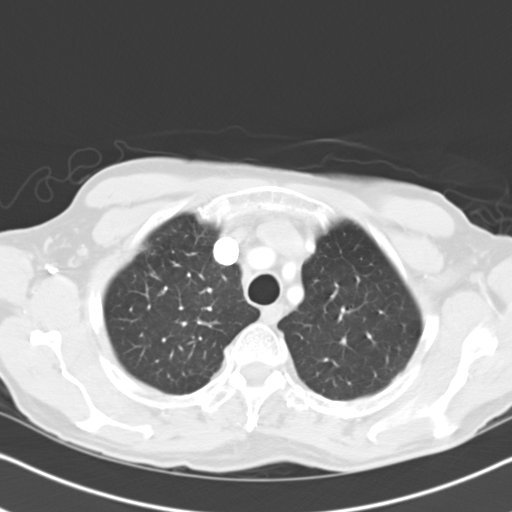
[im 61/67  lung]
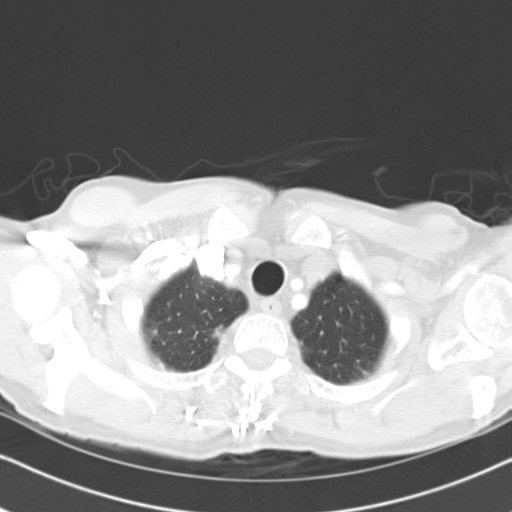

[14 of 32 positions shown; findings below may reference images not displayed]

FINDINGS: As noted on the chest x-ray, there is airspace disease on
the right.  There are patchy areas of infiltrate in the right upper
lobe, right middle lobe, and right lower lobe.  These were not
present on the prior CT scan.  This may be due to blood or
pneumonia.

There is diffuse bronchial wall thickening in the upper and  lower
lobes.  This is most prominent the left lower lobe and was present
on the prior CT.  There is no dominant mass.  There is no
mediastinal adenopathy.  There is subpleural scarring in the left
upper lobe which is unchanged.

There is a large hiatal hernia.  There is no pleural effusion.  No
acute bony abnormality.
IMPRESSION: Patchy infiltrates in the right upper lobe, right middle lobe, and
right lower lobe.  This may be due to pneumonia or blood in the
airway.

Diffuse chronic bronchial wall thickening compatible chronic airway
disease and bronchiectasis.

Large hiatal hernia.

## 2010-08-24 ENCOUNTER — Ambulatory Visit
Admission: RE | Admit: 2010-08-24 | Discharge: 2010-08-24 | Payer: Self-pay | Source: Home / Self Care | Attending: Internal Medicine | Admitting: Internal Medicine

## 2010-08-24 ENCOUNTER — Other Ambulatory Visit: Payer: Self-pay | Admitting: Internal Medicine

## 2010-08-24 LAB — CBC WITH DIFFERENTIAL/PLATELET
Basophils Absolute: 0 10*3/uL (ref 0.0–0.1)
Basophils Relative: 0.4 % (ref 0.0–3.0)
Eosinophils Absolute: 0.1 10*3/uL (ref 0.0–0.7)
Eosinophils Relative: 1.3 % (ref 0.0–5.0)
HCT: 29.5 % — ABNORMAL LOW (ref 39.0–52.0)
Hemoglobin: 10.2 g/dL — ABNORMAL LOW (ref 13.0–17.0)
Lymphocytes Relative: 5.7 % — ABNORMAL LOW (ref 12.0–46.0)
Lymphs Abs: 0.4 10*3/uL — ABNORMAL LOW (ref 0.7–4.0)
MCHC: 34.5 g/dL (ref 30.0–36.0)
MCV: 103.2 fl — ABNORMAL HIGH (ref 78.0–100.0)
Monocytes Absolute: 0.7 10*3/uL (ref 0.1–1.0)
Monocytes Relative: 10.8 % (ref 3.0–12.0)
Neutro Abs: 5.3 10*3/uL (ref 1.4–7.7)
Neutrophils Relative %: 81.8 % — ABNORMAL HIGH (ref 43.0–77.0)
Platelets: 250 10*3/uL (ref 150.0–400.0)
RBC: 2.86 Mil/uL — ABNORMAL LOW (ref 4.22–5.81)
RDW: 18.7 % — ABNORMAL HIGH (ref 11.5–14.6)
WBC: 6.5 10*3/uL (ref 4.5–10.5)

## 2010-08-24 LAB — BASIC METABOLIC PANEL
BUN: 23 mg/dL (ref 6–23)
CO2: 27 mEq/L (ref 19–32)
Calcium: 8.7 mg/dL (ref 8.4–10.5)
Chloride: 109 mEq/L (ref 96–112)
Creatinine, Ser: 1.4 mg/dL (ref 0.4–1.5)
GFR: 51.64 mL/min — ABNORMAL LOW (ref >60.00)
Glucose, Bld: 101 mg/dL — ABNORMAL HIGH (ref 70–99)
Potassium: 4.5 mEq/L (ref 3.5–5.1)
Sodium: 142 mEq/L (ref 135–145)

## 2010-08-24 LAB — SEDIMENTATION RATE: Sed Rate: 26 mm/hr — ABNORMAL HIGH (ref 0–22)

## 2010-08-28 ENCOUNTER — Encounter: Payer: Self-pay | Admitting: Family Medicine

## 2010-09-01 IMAGING — CR DG CHEST 2V
2 series · 2 of 2 positions shown · non-contrast
Comparison: CT from 12/14/2009.  Chest x-ray from 12/14/2009.

CLINICAL DATA: Cough.  Bronchiectasis.

CHEST - 2 VIEW

[view not recorded (1 of 2)]
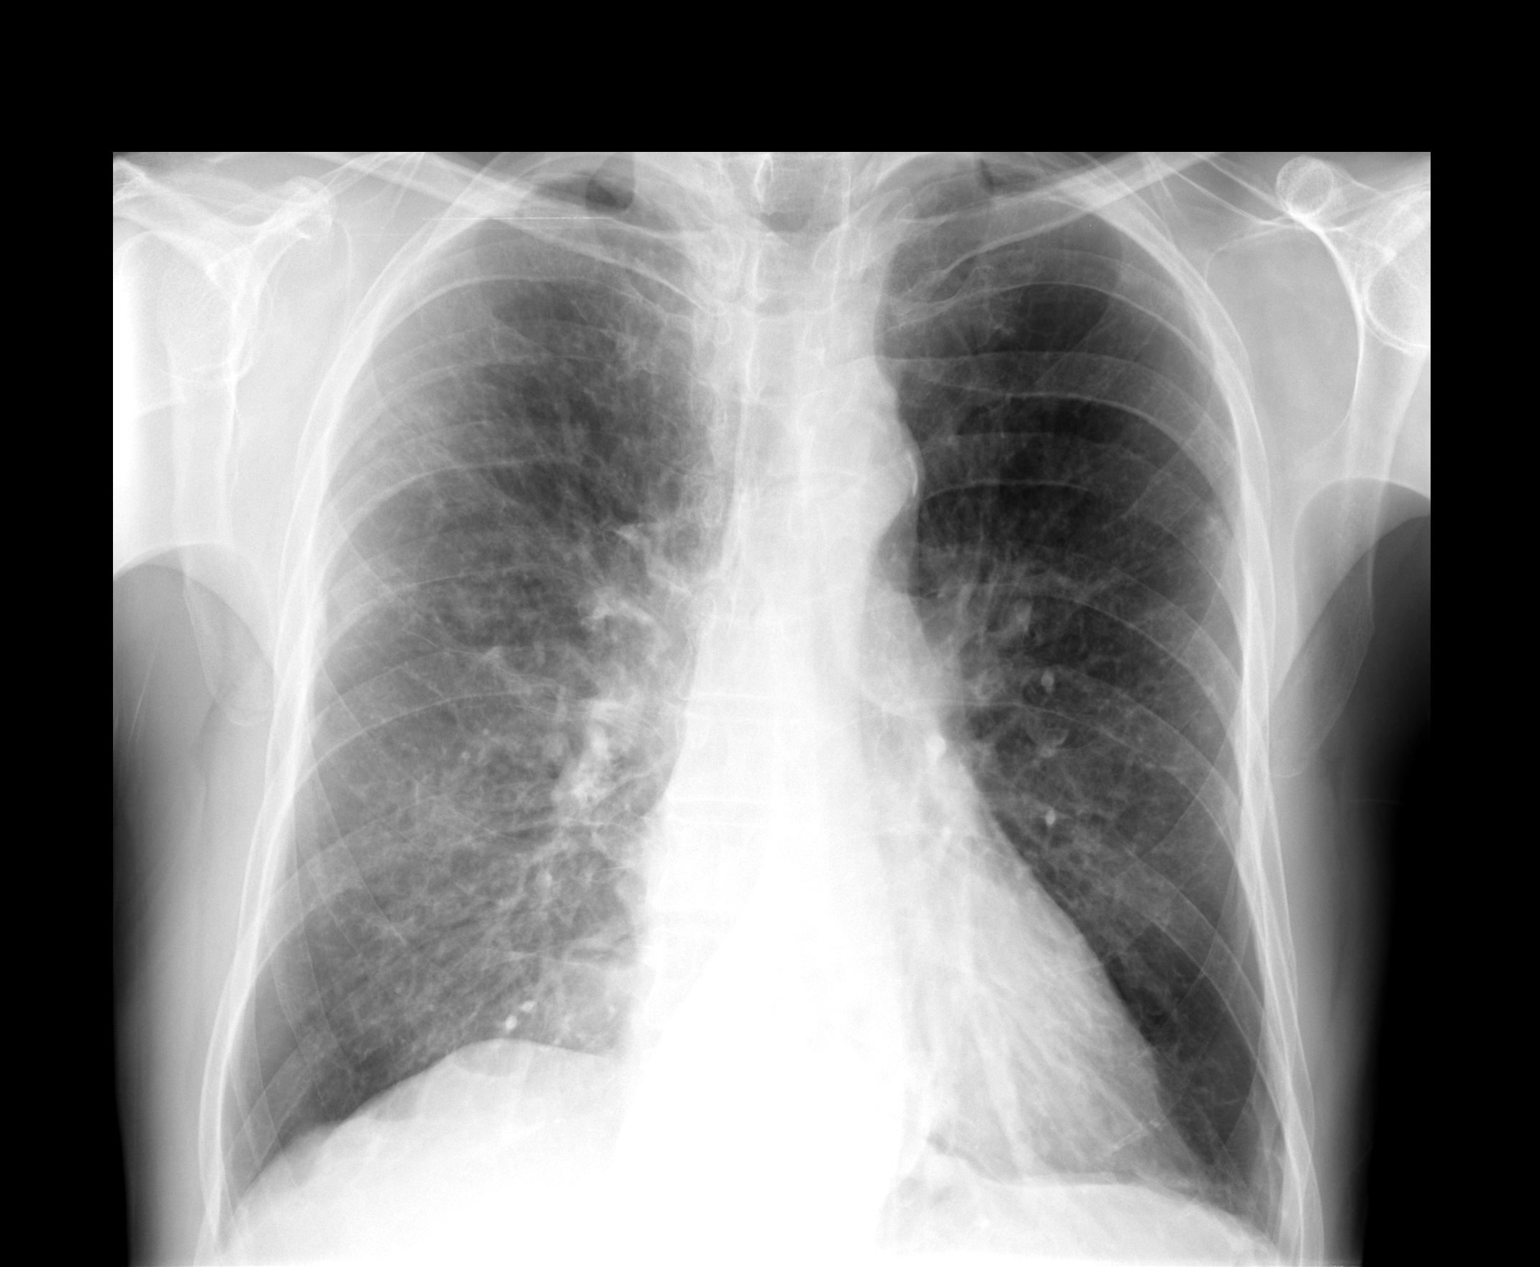

[view not recorded (2 of 2)]
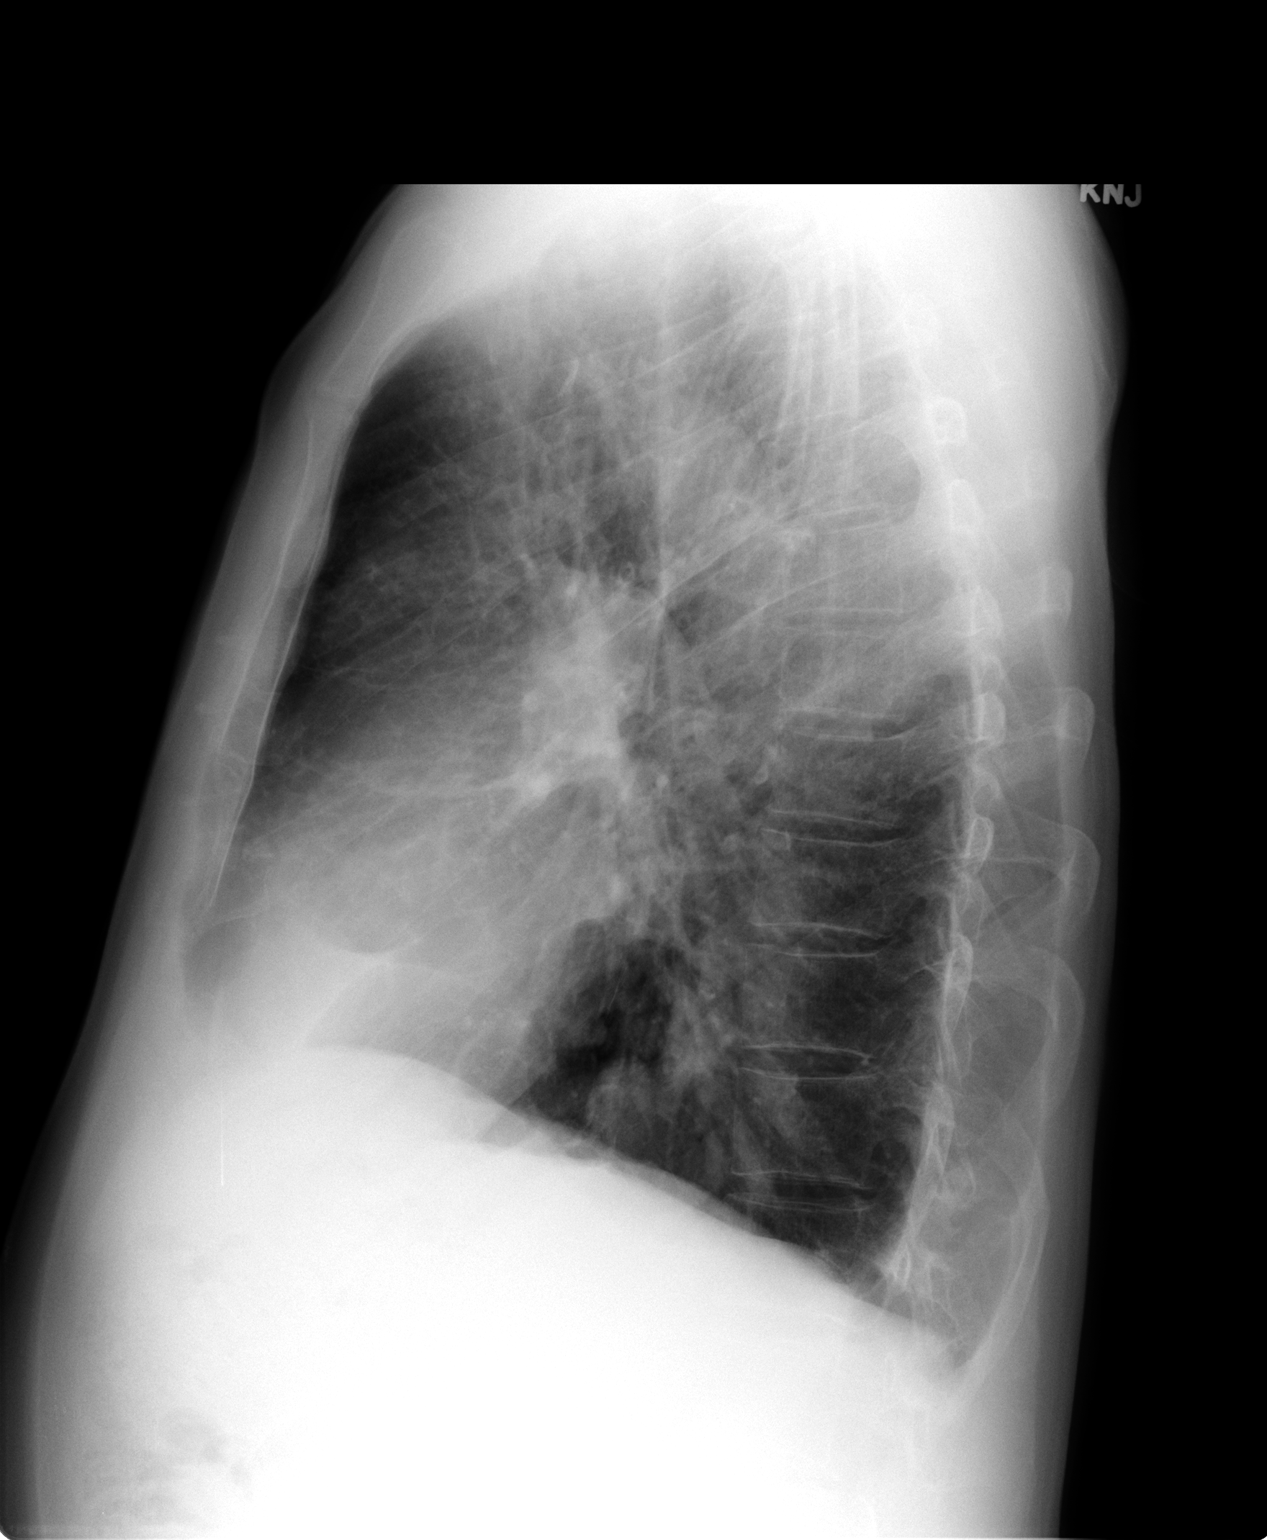

[2 of 2 positions shown; findings below may reference images not displayed]

FINDINGS: Hyperexpansion is consistent with emphysema. Patchy
airspace disease in the right lower lung seen on the previous study
has resolved. The cardiopericardial silhouette is within normal
limits for size. Moderate hiatal hernia again noted. Bones are
diffusely demineralized.
IMPRESSION: Emphysema with interval resolution of the patchy airspace opacities
seen at the right lung base.

## 2010-09-03 ENCOUNTER — Telehealth (INDEPENDENT_AMBULATORY_CARE_PROVIDER_SITE_OTHER): Payer: Self-pay | Admitting: *Deleted

## 2010-09-05 LAB — CONVERTED CEMR LAB
Alkaline Phosphatase: 141 units/L — ABNORMAL HIGH (ref 39–117)
Basophils Relative: 0.5 % (ref 0.0–3.0)
Bilirubin, Direct: 0.2 mg/dL (ref 0.0–0.3)
Calcium: 8.6 mg/dL (ref 8.4–10.5)
Creatinine, Ser: 0.9 mg/dL (ref 0.4–1.5)
Eosinophils Absolute: 0.1 10*3/uL (ref 0.0–0.7)
Eosinophils Relative: 1.6 % (ref 0.0–5.0)
GFR calc non Af Amer: 88.57 mL/min (ref >60)
HDL: 29.6 mg/dL — ABNORMAL LOW (ref >39.00)
Ketones, urine, test strip: NEGATIVE
LDL Cholesterol: 93 mg/dL (ref 0–99)
Lymphocytes Relative: 30.4 % (ref 12.0–46.0)
MCHC: 34.3 g/dL (ref 30.0–36.0)
Neutrophils Relative %: 59.1 % (ref 43.0–77.0)
Nitrite: NEGATIVE
RBC: 4.88 M/uL (ref 4.22–5.81)
Total CHOL/HDL Ratio: 5
Total Protein: 6.8 g/dL (ref 6.0–8.3)
Triglycerides: 73 mg/dL (ref 0.0–149.0)
Urobilinogen, UA: 0.2
Vit D, 25-Hydroxy: 21 ng/mL — ABNORMAL LOW (ref 30–89)
WBC: 4.7 10*3/uL (ref 4.5–10.5)

## 2010-09-07 NOTE — Assessment & Plan Note (Signed)
Summary: 6 months/apc   Primary Provider/Referring Provider:  [name]  CC:  Follow up visit-allergies; not doing good; "congestion about 90% of the time"..  History of Present Illness: 02/15/08- 75 year old man with bronchiectasis and chronic bronchitis, returning for follow-up.  He had gotten Biaxin 4 weeks ago.  With any antibiotic,  he will clear congestion, but it comes back again in a week or so.  On doxycycline now, just finishing.  He denies fever, chest pain, adenopathy, blood.  Weight has been stable.  07/09/08- Bronchiectasis, chronic bronchitis, a llergic rhinitis He has been rotating amoxacillin with biaxin in a 2 week cycle. Amox does less well at controlling cough. No long-term change in dyspnea. Denies fever, chills, blood, Most days will cough yellow- less while on antibiotic. Has had flu vax. declines H1N1.   July 08, 2009- Bronchiectasis, chronic bronchits, allergic rhinitis Had flu vax and has had at least 2 pneumovax.  Notes gradually more dyspnea with exertion and slower to recover over time. Better at mowing lawn than at climbing stairs. Most days he coughs productively dark yellow phlegm. Antibiotcs will clear him for only a seek or two. Using Spiriva, mucinex and singulair. Discussed cost effectiveness of meds. Hasn't had a rescue inhaler recently. Had taken levaquin about 4 weeks ago,  January 20, 2010- Bronchiectasis, chronic bronchitis, allergic rhinitis Dr Scotty Court treated for pneumonia in May. Went to ER and was treated with Rocephin/ zith, extended as zithromax. He was able to go on to his vacation in [place] without problems. He goes about 3 weeks between" build-ups", at which time he needs antibiotic. He had taken 3 days of augnmentin just  before the pneumonia. A week ago, he was again getting congested, finished the augmentin, and so far doing ok. Coughs off and on with yellow phlegm- not as much as he usually does. Today denies fever, chest pain, blood. Always  easier DOE than he was a few years ago.  Preventive Screening-Counseling & Management  Alcohol-Tobacco     Smoking Status: never     Tobacco Counseling: not indicated; no tobacco use  Allergies (verified): No Known Drug Allergies  Past History:  Past Medical History: Last updated: 10/01/2009 Allergic rhinitis GERD- hiatal hernia with reflux Chronic bronchitis/ probable bronchiectasis COPD  Past Surgical History: Last updated: 01/25/2007 HERNIA REPAIR  Family History: Last updated: 02-15-2008 Mother- deceased age 9;car wreck Father- deceased age 40; breathing problems;asthma Brother- living age 108; heart  Sibling- deceased age 8; heart  Social History: Last updated: 02/15/2008 Retired Non smoker Married with 3 children  Risk Factors: Smoking Status: never (01/20/2010)  Review of Systems      See HPI       The patient complains of shortness of breath with activity and productive cough.  The patient denies shortness of breath at rest, non-productive cough, coughing up blood, chest pain, irregular heartbeats, acid heartburn, indigestion, loss of appetite, weight change, abdominal pain, difficulty swallowing, sore throat, tooth/dental problems, headaches, nasal congestion/difficulty breathing through nose, and sneezing.    Vital Signs:  Patient profile:   75 year old male Height:      68 inches Weight:      141 pounds BMI:     21.52 O2 Sat:      97 % on Room air Pulse rate:   62 / minute BP sitting:   116 / 72  (left arm) Cuff size:   regular  Vitals Entered By: Reynaldo Minium CMA (January 20, 2010 9:30 AM)  O2 Flow:  Room air CC: Follow up visit-allergies; not doing good; "congestion about 90% of the time".   Physical Exam  Additional Exam:  General: A/Ox3; pleasant and cooperative, NAD, wdwn SKIN: no rash, lesions NODES: no lymphadenopathy HEENT: [place]/AT, EOM- WNL, Conjuctivae- clear, PERRLA, TM-WNL, Nose- clear, Throat- clear and wnl, Mallampati   III NECK: Supple w/ fair ROM, JVD- none, normal carotid impulses w/o bruits Thyroid-  CHEST: Clear to P&A. Very minimal raspy. HEART: RRR, no m/g/r heard ABDOMEN: Soft and nl; BJY:NWGN, nl pulses, no edema, club bing or cyanosis  NEURO: Grossly intact to observation      Impression & Recommendations:  Problem # 1:  PNEUMONIA (ICD-486)  Clinically resolved but we will want CXR to establish baseline now. His updated medication list for this problem includes:    Amoxicillin-pot Clavulanate 875-125 Mg Tabs (Amoxicillin-pot clavulanate) .Marland Kitchen... 1 twice daily x 7 days for bronchitis    Zithromax Z-pak 250 Mg Tabs (Azithromycin) .Marland Kitchen... 2 stat then 1 qd  Problem # 2:  BRONCHIECTASIS (ICD-494.0) Chronic bronchitis/ bronchiectasis. hemoptysis in this setting was no surprise. He is using Flutter for secretion clearance.  We will get PFT Provide antibiotic- doxycycline, for refillable use if needed.  Problem # 3:  HEMOPTYSIS UNSPECIFIED (ICD-786.30) Bleeding had been consistent with a hemorrhagic bronchitis. Treating the airway irritation has been sufficient. Watch for neoplasm as alternative explanation. His updated medication list for this problem includes:    Spiriva Handihaler 18 Mcg Caps (Tiotropium bromide monohydrate) ..... Inhale 1 capsule by mouth once a day    Singulair 10 Mg Tabs (Montelukast sodium) .Marland Kitchen... Take 1 tablet by mouth once a day    Amoxicillin-pot Clavulanate 875-125 Mg Tabs (Amoxicillin-pot clavulanate) .Marland Kitchen... 1 twice daily x 7 days for bronchitis    Zithromax Z-pak 250 Mg Tabs (Azithromycin) .Marland Kitchen... 2 stat then 1 qd  Other Orders: Est. Patient Level IV (56213) T-2 View CXR (71020TC)  Patient Instructions: 1)  Please schedule a follow-up appointment in 4 months. 2)  A chest x-ray has been recommended.  Your imaging study may require preauthorization.  3)  Script handwritten pending closure of Dr Charmian Muff note- for doycycline 100 mg, # 14, 2 daily, refill x 3. Take this  if you feel congestion from bronchitis building again. 4)  Schedule PFT

## 2010-09-07 NOTE — Assessment & Plan Note (Signed)
Summary: Pulmonary /   ext f/u ov     Primary Richard [name]/Referring [name]:  [name]  CC:  Dyspnea and cough- some improvement.  History of Present Illness: 75 yowm never smoker with dx of bronchiectasis dx with WG 03/2010  July 08, 2009- Bronchiectasis, chronic bronchits, allergic rhinitis Had flu vax and has had at least 2 pneumovax.   January 20, 2010- Bronchiectasis, chronic bronchitis, allergic rhinitis Dr Scotty Court treated for pneumonia in May. Went to ER and was treated with Rocephin/ zith, extended as zithromax. He was able to go on to his vacation in [place] without problems. He goes about 3 weeks between" build-ups", at which time he needs antibiotic. He had taken 3 days of augnmentin just  before the pneumonia.   March 24, 2010 finished Avelox 8/12> sputum  less dark but yellow then doxy on 8/8  p dx with pna in ER,    Admit [facility]  dx WG by pos c anca 03/2010  Admit [facility] dx spont L Ptx 9/08/15/09 requiring Chest tube but no surgery  May 10, 2010 Post hospital followup.  Pt states "breathing is terrible"- gets out of breath walking from one room to the next.  He also c/o cough- sometimes prod with clear sputum. He c/o no appetite and "no taste".  He has been sleeping in a recliner.    Page 2  05/24/10--Presents for a follow up and med reivew. Since last visit. has been hospitalized 05/12/2010-- 10/10/2011for   Right pneumothorax., with underlying  Wegener granulomatosis. and . Bronchiectasis. Pt was found to have Right Pneumo. Chest tube was placed. with improvement and removed 10/7. Xray on 10/10 w/ resolved pnuemo. He was continued on cytoxan and bactrim for PCP prophylaxis.  He has brought all his meds today and we organized them in a med calendar.  He is still very weak. Breathing is slightly better. but still weak . >>same rx   May 27, 2010--Returns today to have suture removed from previous right chest tube for pnuemothorax. He has an office viist. next week with Dr.  [name] for chest xray. He conttinues to be improved with no increased dyspnea or chest pain. No redness or drainage at site. no fevers.   June 09, 2010 Dyspnea and cough- some improvement.  sev episodes of bilateral shoulder > hand pain waking from sleeep, better with ultram , never with ex, no assoc cp, diaph or nausea or sob.  no hemotysis or other joint co's.  Pt denies any significant sore throat, dysphagia, itching, sneezing,  nasal congestion or excess secretions,  fever, chills, sweats, unintended wt loss, pleuritic or exertional cp, hempoptysis, change in activity tolerance  orthopnea pnd or leg swelling Pt also denies any obvious fluctuation in symptoms with weather or environmental change or other alleviating or aggravating factors.       Current Medications (verified): 1)  Symbicort 160-4.5 Mcg/act Aero (Budesonide-Formoterol Fumarate) .... 2 Puffs Two Times A Day 2)  Bisoprolol Fumarate 5 Mg Tabs (Bisoprolol Fumarate) .... 1/2 Once Daily 3)  Flomax 0.4 Mg Xr24h-Cap (Tamsulosin Hcl) .Marland Kitchen.. 1 By Mouth Once Daily 4)  Pantoprazole Sodium 40 Mg Tbec (Pantoprazole Sodium) .... Take  One 30-60 Min Before First Meal of The Day 5)  Cyclophosphamide 25 Mg Tabs (Cyclophosphamide) .... 3 Once Daily 6)  Prednisone 20 Mg Tabs (Prednisone) .... With Breakfast 7)  Avodart 0.5 Mg Caps (Dutasteride) .Marland Kitchen.. 1 Every Other Day 8)  Mucinex Dm 30-600 Mg Xr12h-Tab (Dextromethorphan-Guaifenesin) .Marland Kitchen.. 1 Two Times  A Day 9)  Sulfamethoxazole-Tmp Ds 800-160 Mg Tabs (Sulfamethoxazole-Trimethoprim) .Marland Kitchen.. 1 Every Mon Wed and Fri 10)  Dulcolax 5 Mg Tbec (Bisacodyl) .... As Per Bottle Directions As Needed 11)  *02  2lpm At Rest and If Short of Breath Waliking 3 Lpm 12)  Senokot S 8.6-50 Mg Tabs (Sennosides-Docusate Sodium) .... Take As Directed Per Box 13)  Tylenol 325 Mg Tabs (Acetaminophen) .... Take As Directed Per Bottle 14)  Tramadol Hcl 50 Mg Tabs (Tramadol Hcl) .... Take One To Tow Tabs Every 4 Hrs As  Needed 15)  Maalox Max 1000-60 Mg Chew (Calcium Carbonate-Simethicone) .... Take As Directed Per Bottle As Needed 16)  Xopenex Hfa 45 Mcg/act Aero (Levalbuterol Tartrate) .... 2 Puffs Every 4 Hrs As Needed 17)  Hydromet 5-1.5 Mg/38ml Syrp (Hydrocodone-Homatropine) .Marland Kitchen.. 1 To 2 Tsp Every 4 Hrs As Needed For Cough  Allergies (verified): No Known Drug Allergies  Past History:  Past Medical History: Allergic rhinitis GERD- hiatal hernia with reflux     - Repeat Barium Swallow c/w stricture and reflux 03/29/10 Bronchiectasis     - See CT chest 12/14/2009 > progressive bilateral R> L cavities 03/26/10 > clinical dx Wegener's 04/07/10      - PFTs 02/17/10  FEV1 1.52 (58%) ratio 42 and 15% better after B2, DLC0 118% WEGENER'S GRANULOMATOSIS      - dX BY pos C anca/ supportive TBBX 04/10/10 > CYTOXAN initiated PAF/ abn echo during [facility] admit 03/30/10       - ECH0 Aug 23 2011LVH with  low nl ef, mild mr, ok R function, peak trop .59       - Not anticoagulated due to  WG with Hemoptysis L PTX  04/25/10 R PTX 05/12/10  > final f/u by [name] 06/01/10 benign hypertrophy of the prostate Complex med regimen--Meds reviewed with pt education and computerized med calendar May 25, 2010   Vital Signs:  Patient profile:   75 year old male Weight:      113.25 pounds O2 Sat:      96 % on Room air Temp:     97.8 degrees F oral Pulse rate:   93 / minute BP sitting:   104 / 60  (left arm)  Vitals Entered By: Vernie Murders (June 09, 2010 11:27 AM)  O2 Flow:  Room air  Physical Exam  Additional Exam:  General: A/Ox3; depressed affect and mood Wt 130 > 110 May 10, 2010>>113 May 24, 2010>>113 05/24/10 >>115 May 27, 2010 > 113 June 09, 2010  chronically ill wheelchair bound.  SKIN: no rash, lesions NODES: no lymphadenopathy HEENT: [place]/AT, EOM- WNL, Conjuctivae- clear, PERRLA, TM-WNL, Nose- clear, Throat- clear and wnl, Mallampati  III NECK: Supple w/ fair ROM, JVD- none, normal carotid  impulses w/o bruits Thyroid-  CHEST: Mininimal exp rhonchi bilaterally HEART: RRR, no m/g/r heard ABDOMEN: Soft and nl; ZOX:WRUE, nl pulses, no edema, clubbing or cyanosis  Skin:  Chest wall chest tube site ok    Sodium                    137 mEq/L                   135-145   Potassium            [H]  5.5 mEq/L                   3.5-5.1   Chloride  101 mEq/L                   96-112   Carbon Dioxide            27 mEq/L                    19-32   Glucose                   98 mg/dL                    10-17   BUN                  [H]  28 mg/dL                    5-10   Creatinine                1.2 mg/dL                   2.5-8.5   Calcium                   8.4 mg/dL                   2.7-78.2   GFR                       65.14 mL/min                >60  Tests: (2) CBC Platelet w/Diff (CBCD)   White Cell Count     [H]  11.8 K/uL                   4.5-10.5   Red Cell Count       [L]  3.03 Mil/uL                 4.22-5.81   Hemoglobin           [L]  9.0 g/dL                    42.3-53.6   Hematocrit           [L]  27.7 %                      39.0-52.0   MCV                       91.3 fl                     78.0-100.0   MCHC                      32.5 g/dL                   14.4-31.5   RDW                  [H]  19.0 %                      11.5-14.6   Platelet Count       [H]  538.0 K/uL                  150.0-400.0     Manual Platelet Estimation - Increased   Neutrophil %         [  H]  90.4 %                      43.0-77.0     A Manual Differential was performed and is consistent with the Automated Differential.   Lymphocyte %         [L]  4.0 %                       12.0-46.0   Monocyte %                4.6 %                       3.0-12.0   Eosinophils%              0.6 %                       0.0-5.0   Basophils %               0.4 %                       0.0-3.0   Neutrophill Absolute [H]  10.7 K/uL                   1.4-7.7   Lymphocyte Absolute  [L]  0.5 K/uL                     0.7-4.0   Monocyte Absolute         0.5 K/uL                    0.1-1.0  Eosinophils, Absolute                             0.1 K/uL                    0.0-0.7   Basophils Absolute        0.1 K/uL                    0.0-0.1  Tests: (3) Hepatic/Liver Function Panel (HEPATIC)   Total Bilirubin           0.3 mg/dL                   1.6-1.0   Direct Bilirubin          0.1 mg/dL                   9.6-0.4   Alkaline Phosphatase [H]  157 U/L                     39-117   AST                  [H]  86 U/L                      0-37   ALT                  [H]  165 U/L                     0-53   Total Protein        [L]  5.4 g/dL  6.0-8.3   Albumin              [L]  2.3 g/dL                    1.6-1.0  Tests: (4) Sed Rate (ESR)   Sed Rate             [H]  67 mm/hr                    0-22  Tests: (5) UDip w/Micro (URINE)   Color                     LT. YELLOW       RANGE:  Yellow;Lt. Yellow   Clarity                   CLEAR                       Clear   Specific Gravity          1.020                       1.000 - 1.030   Urine Ph                  5.5                         5.0-8.0   Protein                   NEGATIVE                    Negative   Urine Glucose             NEGATIVE                    Negative   Ketones                   NEGATIVE                    Negative   Urine Bilirubin           NEGATIVE                    Negative   Blood                     MODERATE                    Negative   Urobilinogen              0.2                         0.0 - 1.0   Leukocyte Esterace        NEGATIVE                    Negative   Nitrite                   NEGATIVE                    Negative   Urine WBC                 3-6/hpf  0-2/hpf   Urine RBC                 3-6/hpf                     0-2/hpf   Urine Epith               Rare(0-4/hpf)               Rare(0-4/hpf)   Urine Bacteria            Rare(<10/hpf)               None   Granular Casts             Presence of                 None   Hyaline Casts             Presence of                 None  CXR  Procedure date:  06/01/2010  Findings:      improving ild, no ptx  Impression & Recommendations:  Problem # 1:  WEGENER'S GRANULOMATOSIS (ICD-446.4) cxr reviewed, doing fine, ok to reduce prednisone to 20 mg one half daily though note esr still 67 and may not tolerate the lower dose of pred despite cytoxan   Each maintenance medication was reviewed in detail including most importantly the difference between maintenance and as needed and under what circumstances the prns are to be used. This was done in the context of a medication calendar review which provided the patient with a user-friendly unambiguous mechanism for medication administration and reconciliation and provides an action plan for all active problems. It is critical that this be shown to every doctor  for modification during the office visit if necessary so the patient can use it as a working document.      Problem # 2:  CHEST PAIN (ICD-786.50) PAF in August with slt pos troponin (peak .59)  now maintained on BB with several episodes of noct arm pain but no ex cp.  Rec complete the cardiology w/u at [facility].  ok to restart baby asa but would not use coumadin here  Medications Added to Medication List This Visit: 1)  Prednisone 20 Mg Tabs (Prednisone) .... One half with breakfast daily  Other Orders: Cardiology Referral (Cardiology) TLB-BMP (Basic Metabolic Panel-BMET) (80048-METABOL) TLB-CBC Platelet - w/Differential (85025-CBCD) TLB-Hepatic/Liver Function Pnl (80076-HEPATIC) TLB-Sedimentation Rate (ESR) (85652-ESR) TLB-Udip ONLY (81003-UDIP) Est. Patient Level IV (36644)  Patient Instructions: 1)  Prednisone 20 mg reduce to one half daily  2)  Aspirin 81 mg one daily with breakfast but stop for any bleeding 3)  Please schedule a follow-up appointment in 6 weeks, sooner if needed  4)  See Patient Care Coordinator  before leaving for cardiology appt  5)  See calendar for specific medication instructions and bring it back for each and every office visit for every healthcare [facility] [facility] you see.  Without it,  you may not receive the best quality medical care that we feel you deserve

## 2010-09-07 NOTE — Progress Notes (Signed)
Summary: OXYGEN  Phone Note Call from Patient Call back at [phone]   Caller: Spouse SHIRLEY Call For: [name] Summary of Call: PT DOES NOT FEEL LIKE HE NEEDS OXYGEN ANYMORE WOULD LIKE ORDER TO HAVE IT PICKED UP IF ITS OKAY WITH DR [name] Initial call taken by: Rickard Patience,  June 14, 2010 11:40 AM  Follow-up for Phone Call        [facility] TCB x1. Boone Master CNA/MA  June 14, 2010 12:32 PM   [facility] Gweneth Dimitri RN  June 15, 2010 4:26 PM  Pt's wife returned call.  States pt was asking for her to call bc he wanted to know if the o2 could be picked up however he is still using it at night and at times during the day.  I informed if pt using o2 then it does not need to be picked up.  She verbalized understanding. Follow-up by: Gweneth Dimitri RN,  June 15, 2010 4:45 PM

## 2010-09-07 NOTE — Progress Notes (Signed)
Summary: prescript for brovana and budesonide- AWAITING RESPONSE FROM INS  Phone Note Call from Patient   Caller: Spouse Call For: wert Summary of Call: pt was released from hospital today calling about prescript to go in nebulizer machine. Initial call taken by: Rickard Patience,  May 03, 2010 4:50 PM  Follow-up for Phone Call        called and spoke with pt's spouse.  Spouse states pt was just d/c from hospital today and she went to drop off rx for neb meds and was unable to get them.  Called pt's pharmacy- [facility] on [address]-and was informed that pt dropped off two rx one for Brovana two times a day and one for Budesonide 0.5 two times a day.  They currently do not have brovana stocked and will have to order it and it won't be ready until tomorrow evening.  The budesonide needs a prior auth.  Prior Berkley Harvey # is [phone]  pt's ID is [id number].  WIll call for prior auth tomorrow....in the meantime I called pt's spouse and informed her samples of both brovana and budesonide left at front desk for her to pick up for pt to take while we are working on her prior auth and also waiting for the pharmacy to get the brovana in  [employer] LPN  May 03, 2010 5:26 PM   Additional Follow-up for Phone Call Additional follow up Details #1::        Called the above number for PA.  Was told that pt is with [facility] and given another number to call- [phone].  Called [employer] and initiated PA for budesonide and am awaiting fax.  Additional Follow-up by: Vernie Murders,  May 04, 2010 9:18 AM    Additional Follow-up for Phone Call Additional follow up Details #2::    Form received from [facility] for PA on Budesonide. Form awaiting RB's signature.[name] CMA  May 04, 2010 11:40 AM  RB signed form for MW. MW out of the office this week. Form was faxed back to ins co and placed in traige. AWAITING APPROVAL OR DENIAL.[name] [facility]  May 05, 2010 9:06 AM  Additional  Follow-up for Phone Call Additional follow up Details #3:: Details for Additional Follow-up Action Taken: Per MW, pt was given samples of symbicort to last until next ov and will hold this for his next ov on 05/24/10.  Pt already aware. Additional Follow-up by: Vernie Murders,  May 11, 2010 4:41 PM

## 2010-09-07 NOTE — Progress Notes (Signed)
Summary: after hours phone call  Phone Note From Other Clinic   Caller: ER Physician Reason for Call: Schedule Patient Appt Summary of Call: ER MD called reporting pt was seen for hemoptysis- likely bronchiectasis.  CT scan revealed R upper, middle, and lower lobe PNAs.  started on Rocephin and Azithro.  EDP feels pt is clinically stable enough for DC tonight but wants pt seen tomorrow to determine whether he needs admitted or is improving.  told EDP that message would be sent to [facility] office and pt would be contacted for scheduling. Initial call taken by: Neena Rhymes MD,  Dec 14, 2009 9:17 PM     Appended Document: after hours phone call will be seen today by dr Scotty Court.........gh rn.........Marland Kitchen

## 2010-09-07 NOTE — Progress Notes (Signed)
Summary: authorization  Phone Note From Pharmacy Call back at [phone]   Caller: [employer] ( sandra) Call For: wert  Summary of Call: calling to give info about authorization to nurse Initial call taken by: Rickard Patience,  April 22, 2010 4:24 PM  Follow-up for Phone Call        Spoke with rep and [employer] and she states that this med has been approved from 02/18/10 to 02/19/2011 and approval letter being faxed shortly. Follow-up by: Vernie Murders,  April 22, 2010 4:33 PM

## 2010-09-07 NOTE — Assessment & Plan Note (Addendum)
Summary: Cardiology Nuclear Testing  Nuclear Med Background Indications for Stress Test: Evaluation for Ischemia   History: Asthma, COPD, Echo, Myocardial Perfusion Study  History Comments: '08 MPI-nml; 8/11 Echo low nml EF  Symptoms: Chest Pain, DOE, Fatigue with Exertion, Palpitations, SOB  Symptoms Comments: bilateral arm pain   Nuclear Pre-Procedure Cardiac Risk Factors: Family History - CAD, Lipids Caffeine/Decaff Intake: None NPO After: 7:00 PM Lungs: clear IV 0.9% NS with Angio Cath: 20g     IV Site: R Antecubital IV Started by: Irean Hong, RN Chest Size (in) 36     Height (in): 67 Weight (lb): 109 BMI: 17.13 Tech Comments: Held bisoprolol 48 hrs.  Nuclear Med Study 1 or 2 day study:  1 day     Stress Test Type:  Dobutamine Reading MD:  Dietrich Pates, MD     Referring MD:  P.Ross Resting Radionuclide:  Technetium 51m Tetrofosmin     Resting Radionuclide Dose:  11.0 mCi  Stress Radionuclide:  Technetium 67m Tetrofosmin     Stress Radionuclide Dose:  33.0 mCi   Stress Protocol Exercise Time (min):  12:15 min     Max HR:  131 bpm     Predicted Max HR:  148 bpm  Max Systolic BP: 144 mm Hg     Percent Max HR:  88.51 %Rate Pressure Product:  23557   Dose of Dobutamine:  40 mcg/kg/min (at max HR)  Stress Test Technologist:  Milana Na, EMT-P     Nuclear Technologist:  Domenic Polite, CNMT  Rest Procedure  Myocardial perfusion imaging was performed at rest 45 minutes following the intravenous administration of Technetium 34m Tetrofosmin.  Stress Procedure  The patient received IV dobutamine and no IV atropine. There were no significant changes and a rare pvc with infusion. Techetium 75m Tetrofosmin was injected at peak heart rate and quantitative spect images were obtained after a 45 minute delay.  QPS Raw Data Images:  Soft tissue (diaphragm, bowel activity) underlie heart. Stress Images:  Defect in the inferiro wall (base, mid, distal) inferoseptal wall  (distal) and apex.  Otherwse normal perfsuion. Rest Images:  Improvement with increased counts in the inferiro wall (minimally base, mid and distal), no signific improvement in the distal inferoseptal wall or apex. Transient Ischemic Dilatation:  1.02  (Normal <1.22)  Lung/Heart Ratio:  .35  (Normal <0.45)  Quantitative Gated Spect Images QGS EDV:  75 ml QGS ESV:  30 ml QGS EF:  60 %   Overall Impression  Exercise Capacity: Dobutamine study with no exercise. BP Response: Normal blood pressure response. Clinical Symptoms: No chest pain ECG Impression: No significant ST segment change suggestive of ischemia. Overall Impression: Ischemia and scar in the inferior wall; subendocardial scar in the distal inferioseptal wall and apex.  Review of raw data, cannot excluded coexistent soft tissue attenuation.  Appended Document: Cardiology Nuclear Testing Reviewed results with patient.  Cannot completely exclude that changes represent false positive due to soft tissue attenuation.  With symptoms I would recomm L heart cath to define anatomy.  Patient would like to reflect.  Being seen in pulmonary clinic next week.   Will review with them.  Appended Document: Cardiology Nuclear Testing Patient not interested in having a heart catherization per Dr.Ross.

## 2010-09-07 NOTE — Assessment & Plan Note (Signed)
Summary: Pulmonary/ acute eval of bronchiectasis flare/ RUL pna   Primary Provider/Referring Provider:  [name]  CC:  [name]-weak and 03-15-10 went to ER; accute PNA-started avelox at that time and also gave Doxycycline-not helping. SOB and wheezing; fevers and chills.Pt down 8lbs in 2 weeks.Richard [name]  History of Present Illness: 18 yowm never smoker with dx of bronchiectasis.  07/09/08- Bronchiectasis, chronic bronchitis, a llergic rhinitis He has been rotating amoxacillin with biaxin in a 2 week cycle. Amox does less well at controlling cough. No long-term change in dyspnea. Denies fever, chills, blood, Most days will cough yellow- less while on antibiotic. Has had flu vax. declines H1N1.   July 08, 2009- Bronchiectasis, chronic bronchits, allergic rhinitis Had flu vax and has had at least 2 pneumovax.  Notes gradually more dyspnea with exertion and slower to recover over time. Better at mowing lawn than at climbing stairs. Most days he coughs productively dark yellow phlegm. Antibiotcs will clear him for only a seek or two. Using Spiriva, mucinex and singulair. Discussed cost effectiveness of meds. Hasn't had a rescue inhaler recently. Had taken levaquin about 4 weeks prior to [facility]   January 20, 2010- Bronchiectasis, chronic bronchitis, allergic rhinitis Dr Scotty Court treated for pneumonia in May. Went to ER and was treated with Rocephin/ zith, extended as zithromax. He was able to go on to his vacation in [place] without problems. He goes about 3 weeks between" build-ups", at which time he needs antibiotic. He had taken 3 days of augnmentin just  before the pneumonia.   March 24, 2010 finished Avelox 8/12> sputum  less dark but yellow then doxy on 8/8  p dx with pna in ER, then 5 days ago LLQ pain positonal, worse with cough, low grade fever without rigors. Pt denies any significant sore throat, dysphagia, itching, sneezing,  nasal congestion or excess secretions,  fever, chills, sweats, unintended  wt loss, pleuritic or exertional cp, hempoptysis, change in activity tolerance  orthopnea pnd or leg swelling.  Preventive Screening-Counseling & Management  Alcohol-Tobacco     Smoking Status: never     Tobacco Counseling: not indicated; no tobacco use  Current Medications (verified): 1)  Adult Aspirin Low Strength 81 Mg Chew (Aspirin) .... Take 1 Tablet By Mouth Once A  Day 2)  Avodart 0.5 Mg Caps (Dutasteride) .... Take 1 Capsule By Mouth Once A Day 3)  Nexium 40 Mg  Cpdr (Esomeprazole Magnesium) .Richard [name].. 1 By Mouth Once Daily 4)  Mucinex 600 Mg Tb12 (Guaifenesin) .... Take 1 Tablet By Mouth Twice A Day 5)  Spiriva Handihaler 18 Mcg Caps (Tiotropium Bromide Monohydrate) .... Inhale 1 Capsule By Mouth Once A Day 6)  Singulair 10 Mg Tabs (Montelukast Sodium) .... Take 1 Tablet By Mouth Once A Day 7)  Flomax 0.4 Mg Xr24h-Cap (Tamsulosin Hcl) .Richard [name].. 1 By Mouth Once Daily 8)  Hydrocodone-Homatropine 5-1.5 Mg/48ml Syrp (Hydrocodone-Homatropine) .Richard [name].. 1-2 Tsp Every 4-6 Hrs As Needed Cough 9)  Ibuprofen 200 Mg Tabs (Ibuprofen) .... 2 Three Times A Day Pc 10)  Tramadol Hcl 50 Mg Tabs (Tramadol Hcl) .Richard [name].. 1-2 Tabs Qid To Control Cough 11)  Diclofenac Sodium 75 Mg Tbec (Diclofenac Sodium) .Richard [name].. 1 Two Times A Day Pc For  Arthritis Neck 12)  Omeprazole 40 Mg Cpdr (Omeprazole) .Richard [name].. 1 Once Daily For Gerd 13)  Ventolin Hfa 108 (90 Base) Mcg/act Aers (Albuterol Sulfate) .... 2 Puffs Four Times A Day As Needed  Allergies (verified): No Known Drug Allergies  Past History:  Past  Medical History: Allergic rhinitis GERD- hiatal hernia with reflux Bronchiectasis     - See CT chest 12/14/2009 benign hypertrophy of the prostate  Vital Signs:  Patient profile:   75 year old male Height:      68 inches Weight:      134.38 pounds BMI:     20.51 O2 Sat:      97 % on Room air Temp:     97.9 degrees F oral Pulse rate:   83 / minute BP sitting:   130 / 80  (left arm) Cuff size:   regular  Vitals Entered By:  Reynaldo Minium CMA (March 24, 2010 1:36 PM)  O2 Flow:  Room air CC: [name]-weak,73-8-11 went to ER; accute PNA-started avelox at that time and also gave Doxycycline-not helping. SOB and wheezing; fevers and chills.Pt down 8lbs in 2 weeks.   Physical Exam  Additional Exam:  General: A/Ox3; pleasant and cooperative, NAD, wdwn SKIN: no rash, lesions NODES: no lymphadenopathy HEENT: [place]/AT, EOM- WNL, Conjuctivae- clear, PERRLA, TM-WNL, Nose- clear, Throat- clear and wnl, Mallampati  III NECK: Supple w/ fair ROM, JVD- none, normal carotid impulses w/o bruits Thyroid-  CHEST: Mininimal exp rhonchi bilaterally HEART: RRR, no m/g/r heard ABDOMEN: Soft and nl; JXB:JYNW, nl pulses, no edema, clubbing or cyanosis  NEURO: alert, nl sensorium and gait.      CXR  Procedure date:  03/24/2010  Findings:      Comparison: 03/15/2010   Findings: Trachea is midline.  Heart size normal.  Moderate hiatal hernia.  There is nodular appearing airspace disease bilaterally, right greater than left, with minimal change from 03/15/2010.  No pleural fluid.   IMPRESSION: Nodular bilateral air space disease, right greater than left, stable.  Impression & Recommendations:  Problem # 1:  BRONCHIECTASIS (ICD-494.0) Chronic infiltrates esp RUL with purulent sputum fails to clear completely even with avelox ? MAI ? pseudomonas  Try levaquin 750 x 5 days extend to 10 if no better  and max rx directed at airways   I spent extra time with the patient today explaining optimal mdi  technique.  This improved from  50-75%   I had an extended discussion with the patient  and wife today lasting 15 to 20 minutes of a 25 minute visit on the following issues:   Each maintenance medication was reviewed in detail including most importantly the difference between maintenance prns and under what circumstances the prns are to be used.  In addition, these two groups (for which the patient should keep up with refills)  were distinguished from a third group :  meds that are used only short term with the intent to complete a course of therapy and then not refill them.  The med list was then fully reconciled and reorganized to reflect this important distinction.   Medications Added to Medication List This Visit: 1)  Omeprazole 40 Mg Cpdr (Omeprazole) .... Take  one 30-60 min before first meal of the day 2)  Dulera 100-5 Mcg/act Aero (Mometasone furo-formoterol fum) .... 2 puffs first thing  in am and 2 puffs again in pm about 12 hours later 3)  Tramadol Hcl 50 Mg Tabs (Tramadol hcl) .Richard [name].. 1-2 every 4 hours if needed for cough or pain from coughing 4)  Ventolin Hfa 108 (90 Base) Mcg/act Aers (Albuterol sulfate) .... 2 puffs four times a day as needed difficulty breathing 5)  Ventolin Hfa 108 (90 Base) Mcg/act Aers (Albuterol sulfate) .... 2 puffs four times a day  as needed 6)  Mucinex 600 Mg Tb12 (Guaifenesin) .... 2 every 12 hours if needed for cough 7)  Levaquin 750 Mg Tabs (Levofloxacin) .... One tablet by mouth daily x 5 days  Other Orders: T-2 View CXR (71020TC) Est. Patient Level IV (16109) HFA Instruction [phone])  Patient Instructions: 1)   Think of your medications in 3 separate categories and keep them separate:  2)  a  The ones you take no matter what daily on a scheduled basis 3)  b  The ones you only take if needed for specific problems 4)  c  The ones you take for a short course and stop, like antibiotics and prednisone 5)  Mucinex 600 up 2 every 12 for cough and congestion 6)  Stop spiriva 7)  only ventolin if short of breath 8)  start dulera 100 2 puffs first thing  in am and 2 puffs again in pm about 12 hours later  9)  Work on inhaler technique:  relax and blow all the way out then take a nice smooth deep breath back in, triggering the inhaler at same time you start breathing in hold for seconds 10)  Levaquin x 5 days and stop 11)  See Dr Maple Hudson by end of next week  12)     Prescriptions: DULERA 100-5 MCG/ACT AERO (MOMETASONE FURO-FORMOTEROL FUM) 2 puffs first thing  in am and 2 puffs again in pm about 12 hours later  #1 x 11   Entered and Authorized by:   [name] MD   Signed by:   [name] MD on 03/24/2010   Method used:   Electronically to        [facility] [id number]* (retail)       [address] [place], [place]  [address]       Ph: [phone]       Fax: [phone]   RxID:   [id number] LEVAQUIN 750 MG  TABS (LEVOFLOXACIN) One tablet by mouth daily x 5 days  #5 x 0   Entered and Authorized by:   [name] MD   Signed by:   [name] MD on 03/24/2010   Method used:   Electronically to        [facility] [address] #[id number]* (retail)       [address] [place], [place]  [address]       Ph: [phone]       Fax: [phone]   RxID:   [id number]

## 2010-09-07 NOTE — Progress Notes (Signed)
Summary: ok to resume home care?  Phone Note From Other Clinic Call back at [phone]   Caller: Britta Mccreedy, nurse with [facility] Call For: [name] Summary of Call: patient was dc'd from hospital yesterday.  would like to know if home care may be resumed through [facility]. Initial call taken by: Boone Master CNA/MA,  May 04, 2010 2:36 PM  Follow-up for Phone Call        called and spoke with [facility] from [facility].  She states pt was d/c'd from hospital yesterday.  She is calling to get the ok to resume home care of the pt.  I informed her MW out of office this week.  Britta Mccreedy stated she needs to get this ok asap as they are only given a 24 hr window from time of d/c if they are to resume care.  Britta Mccreedy does state Dr. [name] is on the original orders.  Will forward message to doc of the day to address since MW out of office.  [name] Reynolds LPN  May 04, 2010 2:54 PM   Additional Follow-up for Phone Call Additional follow up Details #1::        called and spoke with Britta Mccreedy and she is aware per SN  that it is ok to resume home care per MW.  she is aware of this and will call back for any problems Arman Filter LPN  May 04, 2010 5:17 PM

## 2010-09-07 NOTE — Miscellaneous (Signed)
Summary: Orders Update pft charges  Clinical Lists Changes  Orders: Added new Service order of Carbon Monoxide diffusing w/capacity (94720) - Signed Added new Service order of Lung Volumes (94240) - Signed Added new Service order of Spirometry (Pre & Post) (94060) - Signed 

## 2010-09-07 NOTE — Progress Notes (Signed)
Summary: not better - pneumonia still very weak  Phone Note Call from Patient Call back at Work Phone [phone]   Summary of Call: Not any better.  Still real weak.  Fever, but not today.  A little congestion.  Coughing up very small amount yellow sputum.  Chest does not hurt, but feels a little tight.  Not much appetitie.  Probably not drinking enough.  Pains down left side last pm when coughing perhaps from the muscles or stomach.   Coughs more at night.  Has cough syrup.   On Doxy since Sat 8-13.  [facility] [facility].  NKDA.  Initial call taken by: Rudy Jew, RN,  March 23, 2010 12:15 PM  Follow-up for Phone Call        needs appt with dr young  Follow-up by: Pura Spice, RN,  March 23, 2010 2:34 PM  Additional Follow-up for Phone Call Additional follow up Details #1::        Notified pt. Additional Follow-up by: Lynann Beaver CMA,  March 23, 2010 2:45 PM

## 2010-09-07 NOTE — Assessment & Plan Note (Signed)
Summary: rocephine inj/njr   Nurse Visit   Allergies: No Known Drug Allergies  Medication Administration  Injection # 1:    Medication: Rocephin  250mg     Diagnosis: PNEUMONIA (ICD-486)    Route: IM    Site: LUOQ gluteus    Exp Date: 06/2012    Lot #: [id number]    Mfr: novaplus     Comments: dr Scotty Court listened to chest and told him ok to go on trip     Patient tolerated injection without complications    Given by: Pura Spice, RN (Dec 16, 2009 10:12 AM)  Orders Added: 1)  Rocephin  250mg  [J0696] 2)  Admin of Therapeutic Inj  intramuscular or subcutaneous [04540]

## 2010-09-07 NOTE — Assessment & Plan Note (Signed)
Summary: 2 weeks/ mbw   Primary Provider/Referring Provider:  [name]  CC:  2 week rov with med calendar - .  History of Present Illness: 63 yowm never smoker with dx of bronchiectasis dx with WG 03/2010  July 08, 2009- Bronchiectasis, chronic bronchits, allergic rhinitis Had flu vax and has had at least 2 pneumovax.   January 20, 2010- Bronchiectasis, chronic bronchitis, allergic rhinitis Dr Scotty Court treated for pneumonia in May. Went to ER and was treated with Rocephin/ zith, extended as zithromax. He was able to go on to his vacation in [place] without problems. He goes about 3 weeks between" build-ups", at which time he needs antibiotic. He had taken 3 days of augnmentin just  before the pneumonia.   March 24, 2010 finished Avelox 8/12> sputum  less dark but yellow then doxy on 8/8  p dx with pna in ER,    Admit [facility]  dx WG by pos c anca 03/2010  Admit [facility] dx spont L Ptx 9/08/15/09 requiring Chest tube but no surgery  May 10, 2010 Post hospital followup.  Pt states "breathing is terrible"- gets out of breath walking from one room to the next.  He also c/o cough- sometimes prod with clear sputum. He c/o no appetite and "no taste".  He has been sleeping in a recliner.   Page 2 >>>>>>>>>>>>>>>>>>>>>>>>>>... 05/24/10--Presents for a follow up and med reivew. Since last visit. has been hospitalized 05/12/2010-- 10/10/2011for   Right pneumothorax., with underlying  Wegener granulomatosis. and . Bronchiectasis. Pt was found to have Right Pneumo. Chest tube was placed. with improvement and removed 10/7. Xray on 10/10 w/ resolved pnuemo. He was continued on cytoxan and bactrim for PCP prophylaxis.  He has brought all his meds today and we organized them in a med calendar.  He is still very weak. Breathing is slightly better. but still weak . Denies chest pain,  orthopnea, hemoptysis, fever, n/v/d, edema, headache.   Medications Prior to Update: 1)  Bisoprolol Fumarate 5 Mg Tabs  (Bisoprolol Fumarate) .... 1/2 Once Daily 2)  Flomax 0.4 Mg Xr24h-Cap (Tamsulosin Hcl) .Marland Kitchen.. 1 By Mouth Once Daily 3)  Pantoprazole Sodium 40 Mg Tbec (Pantoprazole Sodium) .... Take  One 30-60 Min Before First Meal of The Day 4)  Cyclophosphamide 25 Mg Tabs (Cyclophosphamide) .... 3 Once Daily 5)  Prednisone 20 Mg Tabs (Prednisone) .... With Breakfast 6)  Avodart 0.5 Mg Caps (Dutasteride) .Marland Kitchen.. 1 Every Other Day 7)  Brovana 15 Mcg/48ml Nebu (Arformoterol Tartrate) .Marland Kitchen.. 1 in Nebulizer Two Times A Day About To Run Out On 10/4 8)  Budesonide 0.5 Mg/24ml Susp (Budesonide) .Marland Kitchen.. 1 in Nebulizer Two Times A Day 9)  Mucinex Dm 30-600 Mg Xr12h-Tab (Dextromethorphan-Guaifenesin) .Marland Kitchen.. 1 Two Times A Day 10)  Sulfamethoxazole-Tmp Ds 800-160 Mg Tabs (Sulfamethoxazole-Trimethoprim) .Marland Kitchen.. 1 Every Mon Wed and Fri 11)  Dulcolax 5 Mg Tbec (Bisacodyl) .... As Per Bottle Directions As Needed 12)  *02  2lpm At Rest and If Short of Breath Waliking 3 Lpm 13)  Xopenex Hfa 45 Mcg/act Aero (Levalbuterol Tartrate) .... 2 Puffs Every 4 Hrs As Needed 14)  Hydromet 5-1.5 Mg/25ml Syrp (Hydrocodone-Homatropine) .Marland Kitchen.. 1 To 2 Tsp Every 4 Hrs As Needed For Cough  Current Medications (verified): 1)  Symbicort 160-4.5 Mcg/act Aero (Budesonide-Formoterol Fumarate) .... 2 Puffs Two Times A Day 2)  Bisoprolol Fumarate 5 Mg Tabs (Bisoprolol Fumarate) .... 1/2 Once Daily 3)  Flomax 0.4 Mg Xr24h-Cap (Tamsulosin Hcl) .Marland Kitchen.. 1 By Mouth Once Daily 4)  Pantoprazole Sodium 40 Mg Tbec (Pantoprazole Sodium) .... Take  One 30-60 Min Before First Meal of The Day 5)  Cyclophosphamide 25 Mg Tabs (Cyclophosphamide) .... 3 Once Daily 6)  Prednisone 20 Mg Tabs (Prednisone) .... With Breakfast 7)  Avodart 0.5 Mg Caps (Dutasteride) .Marland Kitchen.. 1 Every Other Day 8)  Mucinex Dm 30-600 Mg Xr12h-Tab (Dextromethorphan-Guaifenesin) .Marland Kitchen.. 1 Two Times A Day 9)  Sulfamethoxazole-Tmp Ds 800-160 Mg Tabs (Sulfamethoxazole-Trimethoprim) .Marland Kitchen.. 1 Every Mon Wed and Fri 10)  Dulcolax 5  Mg Tbec (Bisacodyl) .... As Per Bottle Directions As Needed 11)  *02  2lpm At Rest and If Short of Breath Waliking 3 Lpm 12)  Senokot S 8.6-50 Mg Tabs (Sennosides-Docusate Sodium) .... Take As Directed Per Box 13)  Tylenol 325 Mg Tabs (Acetaminophen) .... Take As Directed Per Bottle 14)  Tramadol Hcl 50 Mg Tabs (Tramadol Hcl) .... Take One To Tow Tabs Every 4 Hrs As Needed 15)  Maalox Max 1000-60 Mg Chew (Calcium Carbonate-Simethicone) .... Take As Directed Per Bottle 16)  Xopenex Hfa 45 Mcg/act Aero (Levalbuterol Tartrate) .... 2 Puffs Every 4 Hrs As Needed 17)  Hydromet 5-1.5 Mg/72ml Syrp (Hydrocodone-Homatropine) .Marland Kitchen.. 1 To 2 Tsp Every 4 Hrs As Needed For Cough  Allergies (verified): No Known Drug Allergies  Past History:  Past Surgical History: Last updated: 01/25/2007 HERNIA REPAIR  Family History: Last updated: 02-27-2008 Mother- deceased age 74;car wreck Father- deceased age 78; breathing problems;asthma Brother- living age 39; heart  Sibling- deceased age 65; heart  Social History: Last updated: February 27, 2008 Retired Non smoker Married with 3 children  Risk Factors: Smoking Status: never (03/24/2010)  Past Medical History: Allergic rhinitis GERD- hiatal hernia with reflux     - Repeat Barium Swallow c/w stricture and reflux 03/29/10 Bronchiectasis     - See CT chest 12/14/2009 > progressive bilateral R> L cavities 03/26/10 > clinical dx Wegener's 04/07/10      - PFTs 02/17/10  FEV1 1.52 (58%) ratio 42 and 15% better after B2, DLC0 118% L PTX  04/25/10 R PTX 05/12/10  benign hypertrophy of the prostate Complex med regimen--Meds reviewed with pt education and computerized med calendar May 25, 2010  Wegeners Granulamatosis   Review of Systems      See HPI  Vital Signs:  Patient profile:   75 year old male Weight:      113.25 pounds O2 Sat:      96 % on Room air Temp:     97.2 degrees F oral Pulse rate:   92 / minute BP sitting:   100 / 60  (left arm) Cuff size:    regular  Vitals Entered By: Abigail Miyamoto RN (May 24, 2010 1:56 PM)  O2 Flow:  Room air  Physical Exam  Additional Exam:  General: A/Ox3; depressed affect and mood Wt 130 > 110 May 10, 2010>>113 May 24, 2010>>113 05/24/10  chronically ill wheelchair bound.  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   Plan  Follow  med calendar closely and birng to each visit.  follow up Dr. [name] in 2 weeks and as needed  Please contact office for sooner follow up if symptoms do not improve or worsen   Orders: Est. Patient Level III (16109)  Problem # 2:  PNEUMOTHORAX (ICD-512.8)  recent right spontaneous pneumothorax requiring chest tube , cxr on 10/10 w/ resolved PNUEMO.  has ov with Dr. [name] on 10/25 for repeat xray.   Orders: Est. Patient Level III (60454)  Medications Added to Medication List This Visit: 1)  Symbicort 160-4.5 Mcg/act Aero (Budesonide-formoterol fumarate) .... 2 puffs two times a day 2)  Senokot S 8.6-50 Mg Tabs (Sennosides-docusate sodium) .... Take as directed per box 3)  Tylenol 325 Mg Tabs (Acetaminophen) .... Take as directed per bottle 4)  Tramadol Hcl 50 Mg Tabs (Tramadol hcl) .... Take one to tow tabs every 4 hrs as needed 5)  Maalox Max 1000-60 Mg Chew (Calcium carbonate-simethicone) .... Take as directed per bottle  Complete Medication List: 1)  Symbicort 160-4.5 Mcg/act Aero  (Budesonide-formoterol fumarate) .... 2 puffs two times a day 2)  Bisoprolol Fumarate 5 Mg Tabs (Bisoprolol fumarate) .... 1/2 once daily 3)  Flomax 0.4 Mg Xr24h-cap (Tamsulosin hcl) .Marland Kitchen.. 1 by mouth once daily 4)  Pantoprazole Sodium 40 Mg Tbec (Pantoprazole sodium) .... Take  one 30-60 min before first meal of the day 5)  Cyclophosphamide 25 Mg Tabs (Cyclophosphamide) .... 3 once daily 6)  Prednisone 20 Mg Tabs (Prednisone) .... With breakfast 7)  Avodart 0.5 Mg Caps (Dutasteride) .Marland Kitchen.. 1 every other day 8)  Mucinex Dm 30-600 Mg Xr12h-tab (Dextromethorphan-guaifenesin) .Marland Kitchen.. 1 two times a day 9)  Sulfamethoxazole-tmp Ds 800-160 Mg Tabs (Sulfamethoxazole-trimethoprim) .Marland Kitchen.. 1 every mon wed and fri 10)  Dulcolax 5 Mg Tbec (Bisacodyl) .... As per bottle directions as needed 11)  *02 2lpm At Rest and If Short of Breath Waliking 3 Lpm  12)  Senokot S 8.6-50 Mg Tabs (Sennosides-docusate sodium) .... Take as directed per box 13)  Tylenol 325 Mg Tabs (Acetaminophen) .... Take as directed per bottle 14)  Tramadol Hcl 50 Mg Tabs (Tramadol hcl) .... Take one to tow tabs every 4 hrs as needed 15)  Maalox Max 1000-60 Mg Chew (Calcium carbonate-simethicone) .... Take as directed per bottle 16)  Xopenex Hfa 45 Mcg/act Aero (Levalbuterol tartrate) .... 2 puffs every 4 hrs as needed 17)  Hydromet 5-1.5 Mg/47ml Syrp (Hydrocodone-homatropine) .Marland Kitchen.. 1 to 2 tsp every 4 hrs as needed for cough  Patient Instructions: 1)  Continue on same meds.  2)  Follow med calendar closely and birng to each visit.  3)  follow up Dr. [name] in 2 weeks and as needed  4)  Please contact office for sooner follow up if symptoms do not improve or worsen  Prescriptions: SYMBICORT 160-4.5 MCG/ACT AERO (BUDESONIDE-FORMOTEROL FUMARATE) 2 puffs two times a day  #1 x 5   Entered and Authorized by:   Rubye Oaks NP   Signed by:   Tammy Parrett NP on 05/24/2010   Method used:   Print then Give to Patient   RxID:   [id number] HYDROMET  5-1.5 MG/5ML SYRP (HYDROCODONE-HOMATROPINE) 1 to 2 tsp every 4 hrs as needed for cough  #1 x 8oz   Entered and Authorized by:   Rubye Oaks NP   Signed by:   Tammy Parrett NP on 05/24/2010   Method used:   Print then Give to Patient   RxID:   [id number]

## 2010-09-07 NOTE — Assessment & Plan Note (Signed)
Summary: consult re: headache on back of head near neck/cjr   Vital Signs:  Patient profile:   75 year old male Weight:      139 pounds O2 Sat:      96 % Temp:     98.4 degrees F Pulse rate:   91 / minute BP sitting:   130 / 82  (left arm)  Vitals Entered By: Pura Spice, RN (March 10, 2010 1:09 PM) CC: cough dry cough and pain back up neck radiating up into neck.    History of Present Illness: The second [age] year-old white male with COPD and chronic bronchitis complains of a dry cough nonproductive also complains of pain at the base of the cervical spine radiating into his head planes some pain over the left ear not in the ear No fever blood pressure is controlled 30/82 in the office and it is 140/80 or 90 at home Relates no history of injury to his neck  Allergies (verified): No Known Drug Allergies  Past History:  Past Medical History: Last updated: 10/01/2009 Allergic rhinitis GERD- hiatal hernia with reflux Chronic bronchitis/ probable bronchiectasis COPD  Past Surgical History: Last updated: 01/25/2007 HERNIA REPAIR  Social History: Last updated: 02/07/2008 Retired Non smoker Married with 3 children  Risk Factors: Smoking Status: never (01/20/2010)  Review of Systems      See HPI  The patient denies anorexia, fever, weight loss, weight gain, vision loss, decreased hearing, hoarseness, chest pain, syncope, dyspnea on exertion, peripheral edema, prolonged cough, headaches, hemoptysis, abdominal pain, melena, hematochezia, severe indigestion/heartburn, hematuria, incontinence, genital sores, muscle weakness, suspicious skin lesions, transient blindness, difficulty walking, depression, unusual weight change, abnormal bleeding, enlarged lymph nodes, angioedema, breast masses, and testicular masses.    Physical Exam  General:  Well-developed,well-nourished,in no acute distress; alert,appropriate and cooperative throughout examination Head:  Normocephalic and  atraumatic without obvious abnormalities. No apparent alopecia or balding. Eyes:  No corneal or conjunctival inflammation noted. EOMI. Perrla. Funduscopic exam benign, without hemorrhages, exudates or papilledema. Vision grossly normal. Ears:  External ear exam shows no significant lesions or deformities.  Otoscopic examination reveals clear canals, tympanic membranes are intact bilaterally without bulging, retraction, inflammation or discharge. Hearing is grossly normal bilaterally. Nose:  normal male congestion bilaterally Mouth:  Oral mucosa and oropharynx without lesions or exudates.  Teeth in good repair. Neck:  tenderness or focal spine C3-7 right or wrong left and right to palpation and pressure some radiation of pain into the scalp on the left Chest Wall:  No deformities, masses, tenderness or gynecomastia noted. Lungs:  bronchial breathing no rales no wheezing no dullness to percussion Heart:  Normal rate and regular rhythm. S1 and S2 normal without gallop, murmur, click, rub or other extra sounds. Abdomen:  Bowel sounds positive,abdomen soft and non-tender without masses, organomegaly or hernias noted. Extremities:  No clubbing, cyanosis, edema, or deformity noted with normal full range of motion of all joints.     Impression & Recommendations:  Problem # 1:  GERD (ICD-530.81) Assessment Deteriorated  His updated medication list for this problem includes:    Nexium 40 Mg Cpdr (Esomeprazole magnesium) .Marland Kitchen... 1 by mouth once daily    Omeprazole 40 Mg Cpdr (Omeprazole) .Marland Kitchen... 1 once daily for gerd  Problem # 2:  ARTHRITIS, CERVICAL SPINE (ICD-721.90) Assessment: New diclofenac 75 mg b.i.d.  Problem # 3:  COUGH (ICD-786.2) Assessment: Deteriorated  tramadol 50 mg one to 2 q.i.d. for cough or use hydrocodone on Bactrim at night  Orders: Prescription Created Electronically [id number])  Problem # 4:  COPD (ICD-496) Assessment: Unchanged  His updated medication list for this problem  includes:    Spiriva Handihaler 18 Mcg Caps (Tiotropium bromide monohydrate) ..... Inhale 1 capsule by mouth once a day    Singulair 10 Mg Tabs (Montelukast sodium) .Marland Kitchen... Take 1 tablet by mouth once a day  Complete Medication List: 1)  Adult Aspirin Low Strength 81 Mg Chew (Aspirin) .... Take 1 tablet by mouth once a  day 2)  Avodart 0.5 Mg Caps (Dutasteride) .... Take 1 capsule by mouth once a day 3)  Nexium 40 Mg Cpdr (Esomeprazole magnesium) .Marland Kitchen.. 1 by mouth once daily 4)  Mucinex 600 Mg Tb12 (Guaifenesin) .... Take 1 tablet by mouth twice a day 5)  Spiriva Handihaler 18 Mcg Caps (Tiotropium bromide monohydrate) .... Inhale 1 capsule by mouth once a day 6)  Singulair 10 Mg Tabs (Montelukast sodium) .... Take 1 tablet by mouth once a day 7)  Flomax 0.4 Mg Xr24h-cap (Tamsulosin hcl) .Marland Kitchen.. 1 by mouth once daily 8)  Hydrocodone-homatropine 5-1.5 Mg/76ml Syrp (Hydrocodone-homatropine) .Marland Kitchen.. 1-2 tsp every 4-6 hrs as needed cough 9)  Cefdinir 300 Mg Caps (Cefdinir) .... 2 cap once daily by dr. Maple Hudson 10)  Ibuprofen 200 Mg Tabs (Ibuprofen) .... 2 three times a day pc 11)  Tramadol Hcl 50 Mg Tabs (Tramadol hcl) .Marland Kitchen.. 1-2 tabs qid to control cough 12)  Diclofenac Sodium 75 Mg Tbec (Diclofenac sodium) .Marland Kitchen.. 1 two times a day pc for  arthritis neck 13)  Omeprazole 40 Mg Cpdr (Omeprazole) .Marland Kitchen.. 1 once daily for gerd  Patient Instructions: 1)  the discomfort or arthritis of the cervical spine should be controlled with the diclofenac 75 mg b.i.d. and take his medication relief one to 2 months 2)  Added tramadol tablets 50 mg one or 24 times daily to help stop the nonproductive cough 3)  Continue her regular medications for COPD and benign hypertrophy of the prostate Prescriptions: OMEPRAZOLE 40 MG CPDR (OMEPRAZOLE) 1 once daily FOR gERD  #30 x 11   Entered and Authorized by:   Judithann Sheen MD   Signed by:   Judithann Sheen MD on 03/10/2010   Method used:   Electronically to        [facility] #[id number]* (retail)       [address] [place], [place]  [address]       Ph: [phone]       Fax: [phone]   RxID:   [id number] DICLOFENAC SODIUM 75 MG TBEC (DICLOFENAC SODIUM) 1 two times a day PC FOR  ARTHRITIS NECK  #60 x 11   Entered and Authorized by:   Judithann Sheen MD   Signed by:   Judithann Sheen MD on 03/10/2010   Method used:   Electronically to        [facility] #[id number]* (retail)       [address] [place], [place]  [address]       Ph: [phone]       Fax: [phone]   RxID:   [id number] TRAMADOL HCL 50 MG TABS (TRAMADOL HCL) 1-2 TABS QID TO CONTROL COUGH  #100 x 5   Entered and Authorized by:   Judithann Sheen MD   Signed by:   Judithann Sheen MD on 03/10/2010   Method  used:   Electronically to        [facility] #[id number]* (retail)       [address] [place], [place]  [address]       Ph: [phone]       Fax: [phone]   RxID:   [id number]

## 2010-09-07 NOTE — Assessment & Plan Note (Signed)
Summary: FU ON PNEUMONIA/NJR   Vital Signs:  Patient profile:   75 year old male Weight:      141 pounds O2 Sat:      96 % Temp:     98.3 degrees F Pulse rate:   90 / minute Pulse rhythm:   regular BP sitting:   124 / 74  (left arm)  Vitals Entered By: Pura Spice, RN (Dec 24, 2009 1:45 PM) CC: weak and cough Is Patient Diabetic? No   History of Present Illness: This 55-year-old white male was treated for pneumonia and hemoptysis 2 weeks previously and is in for followup evaluation today he is coughing much less, breathing better and no hemoptysis since the first 2 days of his illness. He continued to have some cough but nonproductive at this time. Complaint is a lack of energywhich I explained it is not unusual following treatment for pneumonia Blood pressure controlled GERD controlled         Allergies: No Known Drug Allergies  Past History:  Past Medical History: Last updated: 10/01/2009 Allergic rhinitis GERD- hiatal hernia with reflux Chronic bronchitis/ probable bronchiectasis COPD  Past Surgical History: Last updated: 01/25/2007 HERNIA REPAIR  Social History: Last updated: 02/07/2008 Retired Non smoker Married with 3 children  Risk Factors: Smoking Status: never (01/25/2007)  Review of Systems      See HPI General:  Complains of fatigue. Eyes:  Denies blurring, discharge, double vision, eye irritation, eye pain, halos, itching, light sensitivity, red eye, vision loss-1 eye, and vision loss-both eyes. ENT:  Denies decreased hearing, difficulty swallowing, ear discharge, earache, hoarseness, nasal congestion, nosebleeds, postnasal drainage, ringing in ears, sinus pressure, and sore throat. CV:  Denies bluish discoloration of lips or nails, chest pain or discomfort, difficulty breathing at night, difficulty breathing while lying down, fainting, fatigue, leg cramps with exertion, lightheadness, near fainting, palpitations, shortness of breath with  exertion, swelling of feet, swelling of hands, and weight gain. Resp:  Complains of cough and shortness of breath; greatly improved since initiation of treatment. GI:  Denies abdominal pain, bloody stools, change in bowel habits, constipation, dark tarry stools, diarrhea, excessive appetite, gas, hemorrhoids, indigestion, loss of appetite, nausea, vomiting, vomiting blood, and yellowish skin color. GU:  Denies decreased libido, discharge, dysuria, erectile dysfunction, genital sores, hematuria, incontinence, nocturia, urinary frequency, and urinary hesitancy. MS:  Denies joint pain, joint redness, joint swelling, loss of strength, low back pain, mid back pain, muscle aches, muscle , cramps, muscle weakness, stiffness, and thoracic pain.  Physical Exam  General:  Well-developed,well-nourished,in no acute distress; alert,appropriate and cooperative throughout examination Head:  Normocephalic and atraumatic without obvious abnormalities. No apparent alopecia or balding. Eyes:  No corneal or conjunctival inflammation noted. EOMI. Perrla. Funduscopic exam benign, without hemorrhages, exudates or papilledema. Vision grossly normal. Ears:  External ear exam shows no significant lesions or deformities.  Otoscopic examination reveals clear canals, tympanic membranes are intact bilaterally without bulging, retraction, inflammation or discharge. Hearing is grossly normal bilaterally. Nose:  External nasal examination shows no deformity or inflammation. Nasal mucosa are pink and moist without lesions or exudates. Mouth:  Oral mucosa and oropharynx without lesions or exudates.  Teeth in good repair. Neck:  No deformities, masses, or tenderness noted. Lungs:  normal respiratory effort, no dullness, no fremitus, no crackles, and no wheezes.   Heart:  Normal rate and regular rhythm. S1 and S2 normal without gallop, murmur, click, rub or other extra sounds. Extremities:  No clubbing, cyanosis, edema, or deformity  noted with normal full range of motion of all joints.     Impression & Recommendations:  Problem # 1:  PNEUMONIA (ICD-486) Assessment Improved  His updated medication list for this problem includes:    Amoxicillin-pot Clavulanate 875-125 Mg Tabs (Amoxicillin-pot clavulanate) .Marland Kitchen... 1 twice daily x 7 days for bronchitis    Zithromax Z-pak 250 Mg Tabs (Azithromycin) .Marland Kitchen... 2 stat then 1 qd  Problem # 2:  HEMOPTYSIS UNSPECIFIED (ICD-786.30) Assessment: Improved  Problem # 3:  COUGH (ICD-786.2) Assessment: Improved  Problem # 4:  COPD (ICD-496) Assessment: Improved  His updated medication list for this problem includes:    Spiriva Handihaler 18 Mcg Caps (Tiotropium bromide monohydrate) ..... Inhale 1 capsule by mouth once a day    Singulair 10 Mg Tabs (Montelukast sodium) .Marland Kitchen... Take 1 tablet by mouth once a day  Complete Medication List: 1)  Adult Aspirin Low Strength 81 Mg Chew (Aspirin) .... Take 1 tablet by mouth once a  day 2)  Avodart 0.5 Mg Caps (Dutasteride) .... Take 1 capsule by mouth once a day 3)  Nexium 40 Mg Cpdr (Esomeprazole magnesium) .Marland Kitchen.. 1 by mouth once daily 4)  Mucinex 600 Mg Tb12 (Guaifenesin) .... Take 1 tablet by mouth twice a day 5)  Spiriva Handihaler 18 Mcg Caps (Tiotropium bromide monohydrate) .... Inhale 1 capsule by mouth once a day 6)  Singulair 10 Mg Tabs (Montelukast sodium) .... Take 1 tablet by mouth once a day 7)  Flomax 0.4 Mg Xr24h-cap (Tamsulosin hcl) .Marland Kitchen.. 1 by mouth once daily 8)  Amoxicillin-pot Clavulanate 875-125 Mg Tabs (Amoxicillin-pot clavulanate) .Marland Kitchen.. 1 twice daily x 7 days for bronchitis 9)  Hydrocodone-homatropine 5-1.5 Mg/61ml Syrp (Hydrocodone-homatropine) .Marland Kitchen.. 1-2 tsp every 4-6 hrs as needed cough 10)  Mucinex Dm Maximum Strength 60-1200 Mg Xr12h-tab (Dextromethorphan-guaifenesin) .... One b.i.d. 11)  Zithromax Z-pak 250 Mg Tabs (Azithromycin) .... 2 stat then 1 qd  Patient Instructions: 1)  my impression is that you are recovered  from his pneumonia and hemoptysis 2)  Continue your regular COPD medications as well only Hydromet as needed 3)  I do not feel that she needed any further antibiotic treatment

## 2010-09-07 NOTE — Assessment & Plan Note (Signed)
Summary: chest congestion/njr   Vital Signs:  Patient profile:   75 year old male Weight:      139 pounds O2 Sat:      96 % Temp:     98.3 degrees F Pulse rate:   86 / minute Pulse rhythm:   regular BP sitting:   140 / 74  (left arm)  Vitals Entered By: Pura Spice, RN (March 04, 2010 9:40 AM) CC: saw dr young 7-21 given doxycycline saturday he called dr young and was given Cefdinir  bid c/o lower legs muscles in neck which started on doxycycline    History of Present Illness: This 75 year old white male with COPD and chronic bronchitis is seen today as a followup of chest congestion. He completed a series of doxycycline on 721 and call Dr. Gladstone Pih D.O. and fatigue without completely clear and Dr. Maple Hudson started him on Ceftinir3 a allograft b.i.d. Another complaint patient relates he's had some muscle aches in the war with a muscles in the neck this started was on doxycycline but percent but has diminished patient is on the statin but will check a CK He relates he continues to have some cough she is now not productive Other problems as his GU problems and GERD is under control     Allergies (verified): No Known Drug Allergies  Past History:  Past Medical History: Last updated: 10/01/2009 Allergic rhinitis GERD- hiatal hernia with reflux Chronic bronchitis/ probable bronchiectasis COPD  Past Surgical History: Last updated: 01/25/2007 HERNIA REPAIR  Social History: Last updated: 02/07/2008 Retired Non smoker Married with 3 children  Risk Factors: Smoking Status: never (01/20/2010)  Review of Systems      See HPI  The patient denies anorexia, fever, weight loss, weight gain, vision loss, decreased hearing, hoarseness, chest pain, syncope, dyspnea on exertion, peripheral edema, prolonged cough, headaches, hemoptysis, abdominal pain, melena, hematochezia, severe indigestion/heartburn, hematuria, incontinence, genital sores, muscle weakness, suspicious skin lesions,  transient blindness, difficulty walking, depression, unusual weight change, abnormal bleeding, enlarged lymph nodes, angioedema, breast masses, and testicular masses.    Physical Exam  General:  Well-developed,well-nourished,in no acute distress; alert,appropriate and cooperative throughout examination Head:  Normocephalic and atraumatic without obvious abnormalities. No apparent alopecia or balding. Eyes:  No corneal or conjunctival inflammation noted. EOMI. Perrla. Funduscopic exam benign, without hemorrhages, exudates or papilledema. Vision grossly normal. Ears:  External ear exam shows no significant lesions or deformities.  Otoscopic examination reveals clear canals, tympanic membranes are intact bilaterally without bulging, retraction, inflammation or discharge. Hearing is grossly normal bilaterally. Nose:  moderate large but playful he was slight yellow drainage and no Tenderness of the right maxillary sinus, minimal Mouth:  Oral mucosa and oropharynx without lesions or exudates.  Teeth in good repair. Neck:  No deformities, masses, or tenderness noted. Chest Wall:  No deformities, masses, tenderness or gynecomastia noted. Lungs:  bronchial breathing but otherwise no evidence of trocar rales no dullness to percussion Heart:  Normal rate and regular rhythm. S1 and S2 normal without gallop, murmur, click, rub or other extra sounds. Abdomen:  Bowel sounds positive,abdomen soft and non-tender without masses, organomegaly or hernias noted. Msk:  as minimal tenderness over the thigh muscles bilaterally, neck muscles    Impression & Recommendations:  Problem # 1:  SINUSITIS - ACUTE-NOS (ICD-461.9) Assessment New  The following medications were removed from the medication list:    Amoxicillin-pot Clavulanate 875-125 Mg Tabs (Amoxicillin-pot clavulanate) .Marland Kitchen... 1 twice daily x 7 days for bronchitis  Mucinex Dm Maximum Strength 60-1200 Mg Xr12h-tab (Dextromethorphan-guaifenesin) ..... One  b.i.d.    Zithromax Z-pak 250 Mg Tabs (Azithromycin) .Marland Kitchen... 2 stat then 1 qd His updated medication list for this problem includes:    Mucinex 600 Mg Tb12 (Guaifenesin) .Marland Kitchen... Take 1 tablet by mouth twice a day    Hydrocodone-homatropine 5-1.5 Mg/49ml Syrp (Hydrocodone-homatropine) .Marland Kitchen... 1-2 tsp every 4-6 hrs as needed cough    Cefdinir 300 Mg Caps (Cefdinir) .Marland Kitchen... 2 cap once daily by dr. Maple Hudson  Orders: Prescription Created Electronically [phone])  Problem # 2:  MYOSITIS (ICD-729.1) Assessment: New  His updated medication list for this problem includes:    Adult Aspirin Low Strength 81 Mg Chew (Aspirin) .Marland Kitchen... Take 1 tablet by mouth once a  day    Ibuprofen 200 Mg Tabs (Ibuprofen) .Marland Kitchen... 2 three times a day pc  Orders: Venipuncture ([address]) Specimen Handling (09811) TLB-CK Total Only(Creatine Kinase/CPK) (82550-CK) TLB-Sedimentation Rate (ESR) (85652-ESR) Prescription Created Electronically ([phone])  Problem # 3:  BRONCHITIS, ACUTE WITH MILD BRONCHOSPASM (ICD-466.0) Assessment: Improved  The following medications were removed from the medication list:    Amoxicillin-pot Clavulanate 875-125 Mg Tabs (Amoxicillin-pot clavulanate) .Marland Kitchen... 1 twice daily x 7 days for bronchitis    Mucinex Dm Maximum Strength 60-1200 Mg Xr12h-tab (Dextromethorphan-guaifenesin) ..... One b.i.d.    Zithromax Z-pak 250 Mg Tabs (Azithromycin) .Marland Kitchen... 2 stat then 1 qd His updated medication list for this problem includes:    Mucinex 600 Mg Tb12 (Guaifenesin) .Marland Kitchen... Take 1 tablet by mouth twice a day    Spiriva Handihaler 18 Mcg Caps (Tiotropium bromide monohydrate) ..... Inhale 1 capsule by mouth once a day    Singulair 10 Mg Tabs (Montelukast sodium) .Marland Kitchen... Take 1 tablet by mouth once a day    Hydrocodone-homatropine 5-1.5 Mg/67ml Syrp (Hydrocodone-homatropine) .Marland Kitchen... 1-2 tsp every 4-6 hrs as needed cough    Cefdinir 300 Mg Caps (Cefdinir) .Marland Kitchen... 2 cap once daily by dr. Maple Hudson  Problem # 4:  COPD (ICD-496) Assessment:  Improved  His updated medication list for this problem includes:    Spiriva Handihaler 18 Mcg Caps (Tiotropium bromide monohydrate) ..... Inhale 1 capsule by mouth once a day    Singulair 10 Mg Tabs (Montelukast sodium) .Marland Kitchen... Take 1 tablet by mouth once a day  Orders: Prescription Created Electronically [phone])  Problem # 5:  COUGH (ICD-786.2) Assessment: Improved  Problem # 6:  GERD (ICD-530.81) Assessment: Improved  His updated medication list for this problem includes:    Nexium 40 Mg Cpdr (Esomeprazole magnesium) .Marland Kitchen... 1 by mouth once daily  Orders: Prescription Created Electronically [phone])  Complete Medication List: 1)  Adult Aspirin Low Strength 81 Mg Chew (Aspirin) .... Take 1 tablet by mouth once a  day 2)  Avodart 0.5 Mg Caps (Dutasteride) .... Take 1 capsule by mouth once a day 3)  Nexium 40 Mg Cpdr (Esomeprazole magnesium) .Marland Kitchen.. 1 by mouth once daily 4)  Mucinex 600 Mg Tb12 (Guaifenesin) .... Take 1 tablet by mouth twice a day 5)  Spiriva Handihaler 18 Mcg Caps (Tiotropium bromide monohydrate) .... Inhale 1 capsule by mouth once a day 6)  Singulair 10 Mg Tabs (Montelukast sodium) .... Take 1 tablet by mouth once a day 7)  Flomax 0.4 Mg Xr24h-cap (Tamsulosin hcl) .Marland Kitchen.. 1 by mouth once daily 8)  Hydrocodone-homatropine 5-1.5 Mg/24ml Syrp (Hydrocodone-homatropine) .Marland Kitchen.. 1-2 tsp every 4-6 hrs as needed cough 9)  Cefdinir 300 Mg Caps (Cefdinir) .... 2 cap once daily by dr. Maple Hudson 10)  Ibuprofen 200 Mg Tabs (  Ibuprofen) .... 2 three times a day pc  Patient Instructions: 1)  bronchitis is much improved but you do have some upper expiratory infection of the chest date the seventh the cefdinir will control 2)  Continue your regular medications 3)  Refill Singulair Prescriptions: SINGULAIR 10 MG TABS (MONTELUKAST SODIUM) Take 1 tablet by mouth once a day  #30 x 11   Entered and Authorized by:   Judithann Sheen MD   Signed by:   Judithann Sheen MD on 03/04/2010   Method  used:   Electronically to        [facility] #[id number]* (retail)       [address] [place], [place]  [address]       Ph: [phone]       Fax: [phone]   RxID:   [id number]

## 2010-09-07 NOTE — Miscellaneous (Signed)
Summary: Orders/[facility]  Orders/[facility]   Imported By: Sherian Rein 05/24/2010 09:36:52  _____________________________________________________________________  External Attachment:    Type:   Image     Comment:   External Document

## 2010-09-07 NOTE — Assessment & Plan Note (Signed)
Summary: rov per TP//jwr   Primary Provider/Referring Provider:  [name]  CC:  need sutures removed. Marland Kitchen  History of Present Illness: 75 yowm never smoker with dx of bronchiectasis dx with WG 03/2010  July 08, 2009- Bronchiectasis, chronic bronchits, allergic rhinitis Had flu vax and has had at least 2 pneumovax.   January 20, 2010- Bronchiectasis, chronic bronchitis, allergic rhinitis Dr Scotty Court treated for pneumonia in May. Went to ER and was treated with Rocephin/ zith, extended as zithromax. He was able to go on to his vacation in [place] without problems. He goes about 3 weeks between" build-ups", at which time he needs antibiotic. He had taken 3 days of augnmentin just  before the pneumonia.   March 24, 2010 finished Avelox 8/12> sputum  less dark but yellow then doxy on 8/8  p dx with pna in ER,    Admit [facility]  dx WG by pos c anca 03/2010  Admit [facility] dx spont L Ptx 9/08/15/09 requiring Chest tube but no surgery  May 10, 2010 Post hospital followup.  Pt states "breathing is terrible"- gets out of breath walking from one room to the next.  He also c/o cough- sometimes prod with clear sputum. He c/o no appetite and "no taste".  He has been sleeping in a recliner.   Page 2 >>>>>>>>>>>>>>>>>>>>>>>>>>... 05/24/10--Presents for a follow up and med reivew. Since last visit. has been hospitalized 05/12/2010-- 10/10/2011for   Right pneumothorax., with underlying  Wegener granulomatosis. and . Bronchiectasis. Pt was found to have Right Pneumo. Chest tube was placed. with improvement and removed 10/7. Xray on 10/10 w/ resolved pnuemo. He was continued on cytoxan and bactrim for PCP prophylaxis.  He has brought all his meds today and we organized them in a med calendar.  He is still very weak. Breathing is slightly better. but still weak . >>same rx   May 27, 2010--Returns today to have suture removed from previous right chest tube for pnuemothorax. He has an office viist. next week with  Dr. [name] for chest xray. He conttinues to be improved with no increased dyspnea or chest pain. No redness or drainage at site. no fevers.   Preventive Screening-Counseling & Management  Alcohol-Tobacco     Smoking Status: never  Medications Prior to Update: 1)  Symbicort 160-4.5 Mcg/act Aero (Budesonide-Formoterol Fumarate) .... 2 Puffs Two Times A Day 2)  Bisoprolol Fumarate 5 Mg Tabs (Bisoprolol Fumarate) .... 1/2 Once Daily 3)  Flomax 0.4 Mg Xr24h-Cap (Tamsulosin Hcl) .Marland Kitchen.. 1 By Mouth Once Daily 4)  Pantoprazole Sodium 40 Mg Tbec (Pantoprazole Sodium) .... Take  One 30-60 Min Before First Meal of The Day 5)  Cyclophosphamide 25 Mg Tabs (Cyclophosphamide) .... 3 Once Daily 6)  Prednisone 20 Mg Tabs (Prednisone) .... With Breakfast 7)  Avodart 0.5 Mg Caps (Dutasteride) .Marland Kitchen.. 1 Every Other Day 8)  Mucinex Dm 30-600 Mg Xr12h-Tab (Dextromethorphan-Guaifenesin) .Marland Kitchen.. 1 Two Times A Day 9)  Sulfamethoxazole-Tmp Ds 800-160 Mg Tabs (Sulfamethoxazole-Trimethoprim) .Marland Kitchen.. 1 Every Mon Wed and Fri 10)  Dulcolax 5 Mg Tbec (Bisacodyl) .... As Per Bottle Directions As Needed 11)  *02  2lpm At Rest and If Short of Breath Waliking 3 Lpm 12)  Senokot S 8.6-50 Mg Tabs (Sennosides-Docusate Sodium) .... Take As Directed Per Box 13)  Tylenol 325 Mg Tabs (Acetaminophen) .... Take As Directed Per Bottle 14)  Tramadol Hcl 50 Mg Tabs (Tramadol Hcl) .... Take One To Tow Tabs Every 4 Hrs As Needed 15)  Maalox Max 1000-60  Mg Chew (Calcium Carbonate-Simethicone) .... Take As Directed Per Bottle 16)  Xopenex Hfa 45 Mcg/act Aero (Levalbuterol Tartrate) .... 2 Puffs Every 4 Hrs As Needed 17)  Hydromet 5-1.5 Mg/97ml Syrp (Hydrocodone-Homatropine) .Marland Kitchen.. 1 To 2 Tsp Every 4 Hrs As Needed For Cough  Current Medications (verified): 1)  Symbicort 160-4.5 Mcg/act Aero (Budesonide-Formoterol Fumarate) .... 2 Puffs Two Times A Day 2)  Bisoprolol Fumarate 5 Mg Tabs (Bisoprolol Fumarate) .... 1/2 Once Daily 3)  Flomax 0.4 Mg Xr24h-Cap  (Tamsulosin Hcl) .Marland Kitchen.. 1 By Mouth Once Daily 4)  Pantoprazole Sodium 40 Mg Tbec (Pantoprazole Sodium) .... Take  One 30-60 Min Before First Meal of The Day 5)  Cyclophosphamide 25 Mg Tabs (Cyclophosphamide) .... 3 Once Daily 6)  Prednisone 20 Mg Tabs (Prednisone) .... With Breakfast 7)  Avodart 0.5 Mg Caps (Dutasteride) .Marland Kitchen.. 1 Every Other Day 8)  Mucinex Dm 30-600 Mg Xr12h-Tab (Dextromethorphan-Guaifenesin) .Marland Kitchen.. 1 Two Times A Day 9)  Sulfamethoxazole-Tmp Ds 800-160 Mg Tabs (Sulfamethoxazole-Trimethoprim) .Marland Kitchen.. 1 Every Mon Wed and Fri 10)  Dulcolax 5 Mg Tbec (Bisacodyl) .... As Per Bottle Directions As Needed 11)  *02  2lpm At Rest and If Short of Breath Waliking 3 Lpm 12)  Senokot S 8.6-50 Mg Tabs (Sennosides-Docusate Sodium) .... Take As Directed Per Box 13)  Tylenol 325 Mg Tabs (Acetaminophen) .... Take As Directed Per Bottle 14)  Tramadol Hcl 50 Mg Tabs (Tramadol Hcl) .... Take One To Tow Tabs Every 4 Hrs As Needed 15)  Maalox Max 1000-60 Mg Chew (Calcium Carbonate-Simethicone) .... Take As Directed Per Bottle As Needed 16)  Xopenex Hfa 45 Mcg/act Aero (Levalbuterol Tartrate) .... 2 Puffs Every 4 Hrs As Needed 17)  Hydromet 5-1.5 Mg/57ml Syrp (Hydrocodone-Homatropine) .Marland Kitchen.. 1 To 2 Tsp Every 4 Hrs As Needed For Cough  Allergies (verified): No Known Drug Allergies  Past History:  Past Medical History: Last updated: 05/24/2010 Allergic rhinitis GERD- hiatal hernia with reflux     - Repeat Barium Swallow c/w stricture and reflux 03/29/10 Bronchiectasis     - See CT chest 12/14/2009 > progressive bilateral R> L cavities 03/26/10 > clinical dx Wegener's 04/07/10      - PFTs 02/17/10  FEV1 1.52 (58%) ratio 42 and 15% better after B2, DLC0 118% L PTX  04/25/10 R PTX 05/12/10  benign hypertrophy of the prostate Complex med regimen--Meds reviewed with pt education and computerized med calendar May 25, 2010  Wegeners Granulamatosis   Family History: Last updated: 2008/02/11 Mother- deceased age  66;car wreck Father- deceased age 61; breathing problems;asthma Brother- living age 59; heart  Sibling- deceased age 66; heart  Social History: Last updated: 2008-02-11 Retired Non smoker Married with 3 children  Review of Systems      See HPI  Vital Signs:  Patient profile:   75 year old male Height:      68 inches Weight:      115.8 pounds BMI:     17.67 O2 Sat:      97 % on Room air Temp:     98.4 degrees F oral Pulse rate:   91 / minute BP sitting:   110 / 74  (left arm) Cuff size:   regular  Vitals Entered By: Zackery Barefoot CMA (May 27, 2010 2:07 PM)  O2 Flow:  Room air CC: need sutures removed.  Comments Medications reviewed with patient Verified contact number and pharmacy with patient Zackery Barefoot CMA  May 27, 2010 2:07 PM    Physical Exam  Additional Exam:  General: A/Ox3; depressed affect and mood Wt 130 > 110 May 10, 2010>>113 May 24, 2010>>113 05/24/10 >>115 May 27, 2010  chronically ill wheelchair bound.  SKIN: no rash, lesions NODES: no lymphadenopathy HEENT: Alexander/AT, EOM- WNL, Conjuctivae- clear, PERRLA, TM-WNL, Nose- clear, Throat- clear and wnl, Mallampati  III NECK: Supple w/ fair ROM, JVD- none, normal carotid impulses w/o bruits Thyroid-  CHEST: Mininimal exp rhonchi bilaterally HEART: RRR, no m/g/r heard ABDOMEN: Soft and nl; FAO:ZHYQ, nl pulses, no edema, clubbing or cyanosis  Skin:  Chest wall chest tube site ok--sutures x 2 removed intact along right mid axillary line , site clear, no redness, well approximated. no drainage.       Impression & Recommendations:  Problem # 1:  PNEUMOTHORAX (ICD-512.8) suture removed , site appears good.  wash/soap and water, watch for sign of infections, call back fo rsooner follow up  follow up next week for xray  Orders: No Charge Patient Arrived (NCPA0) (NCPA0)  Medications Added to Medication List This Visit: 1)  Maalox Max 1000-60 Mg Chew (Calcium  carbonate-simethicone) .... Take as directed per bottle as needed  Patient Instructions: 1)  Wash area with soap and water gently and cover w/ bandage.  2)   follow up Dr. [name] next week as scheduled 3)  follow up Dr. [name] in 2 weeks and as needed  4)  Please contact office for sooner follow up if symptoms do not improve or worsen    Immunization History:  Influenza Immunization History:    Influenza:  historical (05/17/2010)

## 2010-09-07 NOTE — Miscellaneous (Signed)
Summary: Care Plans/[facility]  Care Plans/[facility]   Imported By: Sherian Rein 05/27/2010 10:36:48  _____________________________________________________________________  External Attachment:    Type:   Image     Comment:   External Document

## 2010-09-07 NOTE — Assessment & Plan Note (Signed)
Summary: NP6/ATYPICAL CHEST PAIN/JML  Medications Added ASPIRIN 81 MG TBEC (ASPIRIN) Take one tablet by mouth daily      Allergies Added: NKDA  Visit Type:  new pt Primary Provider:  [name]  CC:  pain in arms.  History of Present Illness: Richard [name] is a 75 yea old who was referred for evaluation of arm pain. the patient has no known history of CAD.  He say every other niight he gets bilateral arm pain.  Not associated with SOB.  Gets up   takes pain pill and it goes away in about 1 1/2 hours.  No weakness. Denies exacerbation with acitivty.  The patient's activity is signif limited by SOB  He has a signif hsitory of COPD/bronchiectasis.  He was hospitalized this summer with a pulm exacerbation and pneumothorax.  During the iniital portion of admit he developed atrial fibrillation that resolved.  Labs were signif for troponins of 0.5.  CK/MB remained negative.  He denied chest pain or palpitations.  Current Medications (verified): 1)  Symbicort 160-4.5 Mcg/act Aero (Budesonide-Formoterol Fumarate) .... 2 Puffs Two Times A Day 2)  Bisoprolol Fumarate 5 Mg Tabs (Bisoprolol Fumarate) .... 1/2 Once Daily 3)  Flomax 0.4 Mg Xr24h-Cap (Tamsulosin Hcl) .Marland Kitchen.. 1 By Mouth Once Daily 4)  Pantoprazole Sodium 40 Mg Tbec (Pantoprazole Sodium) .... Take  One 30-60 Min Before First Meal of The Day 5)  Cyclophosphamide 25 Mg Tabs (Cyclophosphamide) .... 3 Once Daily 6)  Prednisone 20 Mg Tabs (Prednisone) .... One Half With Breakfast Daily 7)  Avodart 0.5 Mg Caps (Dutasteride) .Marland Kitchen.. 1 Every Other Day 8)  Mucinex Dm 30-600 Mg Xr12h-Tab (Dextromethorphan-Guaifenesin) .Marland Kitchen.. 1 Two Times A Day 9)  Sulfamethoxazole-Tmp Ds 800-160 Mg Tabs (Sulfamethoxazole-Trimethoprim) .Marland Kitchen.. 1 Every Mon Wed and Fri 10)  Dulcolax 5 Mg Tbec (Bisacodyl) .... As Per Bottle Directions As Needed 11)  *02  2lpm At Rest and If Short of Breath Waliking 3 Lpm 12)  Senokot S 8.6-50 Mg Tabs (Sennosides-Docusate Sodium) .... Take As  Directed Per Box 13)  Tylenol 325 Mg Tabs (Acetaminophen) .... Take As Directed Per Bottle 14)  Tramadol Hcl 50 Mg Tabs (Tramadol Hcl) .... Take One To Tow Tabs Every 4 Hrs As Needed 15)  Maalox Max 1000-60 Mg Chew (Calcium Carbonate-Simethicone) .... Take As Directed Per Bottle As Needed 16)  Xopenex Hfa 45 Mcg/act Aero (Levalbuterol Tartrate) .... 2 Puffs Every 4 Hrs As Needed 17)  Hydromet 5-1.5 Mg/58ml Syrp (Hydrocodone-Homatropine) .Marland Kitchen.. 1 To 2 Tsp Every 4 Hrs As Needed For Cough 18)  Aspirin 81 Mg Tbec (Aspirin) .... Take One Tablet By Mouth Daily  Allergies (verified): No Known Drug Allergies  Past History:  Past medical, surgical, family and social histories (including risk factors) reviewed, and no changes noted (except as noted below).  Past Medical History: Reviewed history from 06/09/2010 and no changes required. Allergic rhinitis GERD- hiatal hernia with reflux     - Repeat Barium Swallow c/w stricture and reflux 03/29/10 Bronchiectasis     - See CT chest 12/14/2009 > progressive bilateral R> L cavities 03/26/10 > clinical dx Wegener's 04/07/10      - PFTs 02/17/10  FEV1 1.52 (58%) ratio 42 and 15% better after B2, DLC0 118% WEGENER'S GRANULOMATOSIS      - dX BY pos C anca/ supportive TBBX 04/10/10 > CYTOXAN initiated PAF/ abn echo during [facility] admit 03/30/10       - ECH0 Aug 23 2011LVH with  low nl ef, mild mr, ok R function,  peak trop .59       - Not anticoagulated due to  WG with Hemoptysis L PTX  04/25/10 R PTX 05/12/10  > final f/u by [name] 06/01/10 benign hypertrophy of the prostate Complex med regimen--Meds reviewed with pt education and computerized med calendar May 25, 2010   Past Surgical History: Reviewed history from 01/25/2007 and no changes required. HERNIA REPAIR  Family History: Reviewed history from 02/07/2008 and no changes required. Mother- deceased age 61;car wreck Father- deceased age 32; breathing problems;asthma Brother- living age 53; heart  CAD  (s/p CABG) Sibling- deceased age 20; heart (s/p CABG)  Social History: Reviewed history from 02/07/2008 and no changes required. Retired Non smoker Married with 3 children  Review of Systems       All systems reviewed.  Neg to the abvoe problem.  Vital Signs:  Patient profile:   75 year old male Height:      68 inches Weight:      111.50 pounds BMI:     17.01 Pulse rate:   82 / minute BP sitting:   108 / 56  (left arm) Cuff size:   regular  Vitals Entered By: Caralee Ates CMA (June 21, 2010 2:50 PM)  Physical Exam  Additional Exam:  patient is in NAD HEENT:  Normocephalic, atraumatic. EOMI, PERRLA.  Neck: JVP is normal. No thyromegaly. No bruits.  Lungs: Decreased airflow.  Rhonhi bilaterally R >L Heart: Regular rate and rhythm. Normal S1, S2. No S3.   No significant murmurs. PMI not displaced.  Abdomen:  Supple, nontender. Normal bowel sounds. No masses. No hepatomegaly.  Extremities:   Good distal pulses throughout. No lower extremity edema.  Musculoskeletal :moving all extremities.  Neuro:   alert and oriented x3.    EKG  Procedure date:  06/21/2010  Findings:      NSR.  82 bpm.  Impression & Recommendations:  Problem # 1:  SHORTNESS OF BREATH (ICD-786.05) Patient has signif lung disease.  He was admitted this summer with exacerbation.  Developed transient afib prob due to increased catechol with illness.  Trop were mildly increased.   No progression. With this and with his families history I would recomm a dobutamine myoview to rule out inducible ischemia.  Problem # 2:  CHEST PAIN (ICD-786.50) Patient does not have chst pain.  Has arm pain.  Atypical  Does not appear to be cardiac.  Problem # 3:  COPD (ICD-496) Followed by Dr. [name].  Other Orders: EKG w/ Interpretation (93000) Nuclear Stress Test (Nuc Stress Test)  Patient Instructions: 1)  Your physician has requested that you have a dobutamine myoview.  For further information please visit  [email or web address]  Please follow instruction sheet, as given. we will call you with results

## 2010-09-07 NOTE — Progress Notes (Signed)
Summary: talk to Dr. Scotty Court  Phone Note Call from Patient   Caller: Son Call For: Judithann Sheen MD Summary of Call: Pts son wants to talk about his Dad which is hospitalized now at [facility]. [phone] Initial call taken by: Lynann Beaver CMA,  March 30, 2010 12:29 PM  Follow-up for Phone Call        called by dr Scotty Court.  Follow-up by: Pura Spice, RN,  March 31, 2010 8:56 AM

## 2010-09-07 NOTE — Progress Notes (Signed)
Summary: Needs Cytoxan approved today > done, meds approved.  Phone Note Outgoing Call   Summary of Call: pt called and stated pharmacy would not fill cytoxan with preauth from insurance.  apparently this was faxed to our office today.  Will send a note to dr. [name] and triage to expedite.  I would recommend filling out paperwork and faxing to insurance WHILE ON PHONE with them.  the pt was not able to get this filled, and needs to asap. Initial call taken by: Barbaraann Share MD,  April 21, 2010 6:00 PM Initial call taken by: [name] MD,  April 22, 2010 2:14 PM  Follow-up for Phone Call        let's solve this problem today if possible, let me know what I can do   forms completed Follow-up by: [name] MD,  April 22, 2010 8:58 AM  Additional Follow-up for Phone Call Additional follow up Details #1::        Per pt's spouse they tried to fill this RX at [facility] on [address]. I will call the pharmacy to get the information I need to start the PA for Cytoxen. [employer] will fax over PA request form to [phone]. Awaiting fax.[name] [facility]  April 22, 2010 9:27 AM  Form was faxed back to [facility].  Vernie Murders  April 22, 2010 3:02 PM'     Additional Follow-up for Phone Call Additional follow up Details #2::    Awaiting fax from [facility] [employer]. There phone is [phone].[name] [facility]  April 22, 2010 10:39 AM  Form received and I gave it to [facility] so Dr. [name] may sign. Will forward this to [place] until [facility] siigns form and returns to her to be faxed.[name] [facility]  April 22, 2010 11:13 AM  done Follow-up by: [name] MD,  April 22, 2010 2:15 PM  Additional Follow-up for Phone Call Additional follow up Details #3:: Details for Additional Follow-up Action Taken: Spoke with rep at [facility] and was advised that med approved x 1 year. I called [facility] [address] and made them aware this was approved.  I called the pt and informed  him as well. Additional Follow-up by: Vernie Murders,  April 22, 2010 4:38 PM

## 2010-09-07 NOTE — Assessment & Plan Note (Signed)
Summary: chest congestion/njr   Vital Signs:  Patient profile:   75 year old male Weight:      144 pounds O2 Sat:      98 % Temp:     98.3 degrees F Pulse rate:   75 / minute BP sitting:   140 / 84  (left arm) Cuff size:   regular  Vitals Entered By: Pura Spice, RN (October 01, 2009 1:59 PM) CC: chest congestion sinus x 3 wks completed clarithomycin.  Is Patient Diabetic? No   History of Present Illness: This 75 year old white male with COPD and chronic bronchitis has had chest congestion and facial pressure or nasal congestion for the past 3 weeks. He been given Biaxin by another physician and face is 6 days ago without any help he continues to cough yellow sputum ear has continued his regular medications as well as using Hydromet cough, as well as Mucinex DM His other medical problems as her nocturia been under control with Avodart and Flomax and Nexium  Allergies (verified): No Known Drug Allergies  Past History:  Past Surgical History: Last updated: 01/25/2007 HERNIA REPAIR  Social History: Last updated: 02/07/2008 Retired Non smoker Married with 3 children  Risk Factors: Smoking Status: never (01/25/2007)  Past Medical History: Allergic rhinitis GERD- hiatal hernia with reflux Chronic bronchitis/ probable bronchiectasis COPD  Review of Systems  The patient denies anorexia, fever, weight loss, weight gain, vision loss, decreased hearing, hoarseness, chest pain, syncope, dyspnea on exertion, peripheral edema, prolonged cough, headaches, hemoptysis, abdominal pain, melena, hematochezia, severe indigestion/heartburn, hematuria, incontinence, genital sores, muscle weakness, suspicious skin lesions, transient blindness, difficulty walking, depression, unusual weight change, abnormal bleeding, enlarged lymph nodes, angioedema, breast masses, and testicular masses.    Physical Exam  General:  Well-developed,well-nourished,in no acute distress; alert,appropriate  and cooperative throughout examination Head:  Normocephalic and atraumatic without obvious abnormalities. No apparent alopecia or balding. Eyes:  No corneal or conjunctival inflammation noted. EOMI. Perrla. Funduscopic exam benign, without hemorrhages, exudates or papilledema. Vision grossly normal. Ears:  External ear exam shows no significant lesions or deformities.  Otoscopic examination reveals clear canals, tympanic membranes are intact bilaterally without bulging, retraction, inflammation or discharge. Hearing is grossly normal bilaterally. Nose:  boggy nasal mucosa nerve same as with yellow drainage maxillary tenderness bilaterally Mouth:  pharynx pink and moist.   Neck:  No deformities, masses, or tenderness noted. Chest Wall:  No deformities, masses, tenderness or gynecomastia noted. Lungs:  generalized diminished breath sounds however with some rales at both bases minimal expiratory wheeze Heart:  Normal rate and regular rhythm. S1 and S2 normal without gallop, murmur, click, rub or other extra sounds. Abdomen:  Bowel sounds positive,abdomen soft and non-tender without masses, organomegaly or hernias noted. Extremities:  No clubbing, cyanosis, edema, or deformity noted with normal full range of motion of all joints.     Impression & Recommendations:  Problem # 1:  COPD (ICD-496) Assessment Unchanged  His updated medication list for this problem includes:    Spiriva Handihaler 18 Mcg Caps (Tiotropium bromide monohydrate) ..... Inhale 1 capsule by mouth once a day    Singulair 10 Mg Tabs (Montelukast sodium) .Marland Kitchen... Take 1 tablet by mouth once a day  Problem # 2:  BRONCHITIS (ICD-490) Assessment: Deteriorated  His updated medication list for this problem includes:    Mucinex 600 Mg Tb12 (Guaifenesin) .Marland Kitchen... Take 1 tablet by mouth twice a day    Spiriva Handihaler 18 Mcg Caps (Tiotropium bromide monohydrate) ..... Inhale 1  capsule by mouth once a day    Singulair 10 Mg Tabs  (Montelukast sodium) .Marland Kitchen... Take 1 tablet by mouth once a day    Amoxicillin-pot Clavulanate 875-125 Mg Tabs (Amoxicillin-pot clavulanate) .Marland Kitchen... 1 twice daily x 7 days    Hydrocodone-homatropine 5-1.5 Mg/56ml Syrp (Hydrocodone-homatropine) .Marland Kitchen... 1-2 tsp every 4-6 hrs as needed cough    Amoxicillin 500 Mg Caps (Amoxicillin) .Marland Kitchen... Take one cap by mouth three times a day    Avelox 400 Mg Tabs (Moxifloxacin hcl) .Marland Kitchen... 1 qd    Mucinex Dm Maximum Strength 60-1200 Mg Xr12h-tab (Dextromethorphan-guaifenesin) ..... One b.i.d.  Orders: Prescription Created Electronically [phone])  Problem # 3:  GERD (ICD-530.81) Assessment: Improved  The following medications were removed from the medication list:    Omeprazole 40 Mg Cpdr (Omeprazole) .Marland Kitchen... 1 qd His updated medication list for this problem includes:    Nexium 40 Mg Cpdr (Esomeprazole magnesium) .Marland Kitchen... 1 by mouth once daily  Problem # 4:  ALLERGIC RHINITIS (ICD-477.9) Assessment: Unchanged  Complete Medication List: 1)  Adult Aspirin Low Strength 81 Mg Chew (Aspirin) .... Take 1 tablet by mouth once a  day 2)  Avodart 0.5 Mg Caps (Dutasteride) .... Take 1 capsule by mouth once a day 3)  Nexium 40 Mg Cpdr (Esomeprazole magnesium) .Marland Kitchen.. 1 by mouth once daily 4)  Mucinex 600 Mg Tb12 (Guaifenesin) .... Take 1 tablet by mouth twice a day 5)  Spiriva Handihaler 18 Mcg Caps (Tiotropium bromide monohydrate) .... Inhale 1 capsule by mouth once a day 6)  Singulair 10 Mg Tabs (Montelukast sodium) .... Take 1 tablet by mouth once a day 7)  Flomax 0.4 Mg Xr24h-cap (Tamsulosin hcl) .Marland Kitchen.. 1 by mouth once daily 8)  Amoxicillin-pot Clavulanate 875-125 Mg Tabs (Amoxicillin-pot clavulanate) .Marland Kitchen.. 1 twice daily x 7 days 9)  Hydrocodone-homatropine 5-1.5 Mg/36ml Syrp (Hydrocodone-homatropine) .Marland Kitchen.. 1-2 tsp every 4-6 hrs as needed cough 10)  Amoxicillin 500 Mg Caps (Amoxicillin) .... Take one cap by mouth three times a day 11)  Avelox 400 Mg Tabs (Moxifloxacin hcl) .Marland Kitchen.. 1  qd 12)  Mucinex Dm Maximum Strength 60-1200 Mg Xr12h-tab (Dextromethorphan-guaifenesin) .... One b.i.d.  Patient Instructions: 1)  copd WITH CHRONIC BRONCHITIS 2)  aVE,OX 400 MG EACH DAY 3)  sYMBICORT 160/4.5 2 INHALATIONS am AND pm 4)  CONTINUE SPIRIVA 5)  CONTINUE SINGULAR 6)  continue Hydromet for cough 7)  Opinion good fluid intact Prescriptions: AVELOX 400 MG TABS (MOXIFLOXACIN HCL) 1 QD  #3 x 0   Entered and Authorized by:   Judithann Sheen MD   Signed by:   Judithann Sheen MD on 10/01/2009   Method used:   Print then Give to Patient   RxID:   [id number]

## 2010-09-07 NOTE — Assessment & Plan Note (Signed)
Summary: er fup//ccm   Vital Signs:  Patient profile:   75 year old male Weight:      138 pounds O2 Sat:      95 % Temp:     98.3 degrees F Pulse rate:   80 / minute BP sitting:   140 / 84  (left arm)  Vitals Entered By: Pura Spice, RN (March 18, 2010 2:39 PM) CC: went to [facility] ER pneumonia  started on labuterol prednisone and doxycycline    History of Present Illness: This 75 year old white male with known COPD, chronic bronchitis was doing well lung lives until 53 when he began having cough congestion and had increase fever to 101 patient went to the emergency room on a day but previously had started Avelox 400 mg q.d., Singulair, uses hydrocodone homatropine for cough he had taken some ibuprofen for fever he went to the emergency room on the eighth he felt very badly very sick increased dyspnea plus increased cough In the emergency room he was x-rayed and found to have pneumonia in the right`lung Was treated with prednisone, to start doxycycline one completed with Avelox Patient has improved with no fever diminished cough however has some dyspnea on exertion has been using a albuterol inhaler for relief when wheezing and dyspnea Exam for follow up examination  Allergies: No Known Drug Allergies  Past History:  Past Surgical History: Last updated: 01/25/2007 HERNIA REPAIR  Social History: Last updated: 02/07/2008 Retired Non smoker Married with 3 children  Risk Factors: Smoking Status: never (01/20/2010)  Past Medical History: Allergic rhinitis GERD- hiatal hernia with reflux Chronic bronchitis/ probable bronchiectasis COPD benign hypertrophy of the prostate  Review of Systems      See HPI  The patient denies anorexia, fever, weight loss, weight gain, vision loss, decreased hearing, hoarseness, chest pain, syncope, dyspnea on exertion, peripheral edema, prolonged cough, headaches, hemoptysis, abdominal pain, melena, hematochezia, severe indigestion/heartburn,  hematuria, incontinence, genital sores, muscle weakness, suspicious skin lesions, transient blindness, difficulty walking, depression, unusual weight change, abnormal bleeding, enlarged lymph nodes, angioedema, breast masses, and testicular masses.    Physical Exam  General:  Well-developed,well-nourished,in no acute distress; alert,appropriate and cooperative throughout examination Head:  Normocephalic and atraumatic without obvious abnormalities. No apparent alopecia or balding. Eyes:  No corneal or conjunctival inflammation noted. EOMI. Perrla. Funduscopic exam benign, without hemorrhages, exudates or papilledema. Vision grossly normal. Ears:  External ear exam shows no significant lesions or deformities.  Otoscopic examination reveals clear canals, tympanic membranes are intact bilaterally without bulging, retraction, inflammation or discharge. Hearing is grossly normal bilaterally. Nose:  External nasal examination shows no deformity or inflammation. Nasal mucosa are pink and moist without lesions or exudates. Mouth:  Oral mucosa and oropharynx without lesions or exudates.  Teeth in good repair. Neck:  No deformities, masses, or tenderness noted. Chest Wall:  No deformities, masses, tenderness or gynecomastia noted. Lungs:  bronchial breathing with rales in the right base just to dullness no wheezing at this time Heart:  Normal rate and regular rhythm. S1 and S2 normal without gallop, murmur, click, rub or other extra sounds. Abdomen:  Bowel sounds positive,abdomen soft and non-tender without masses, organomegaly or hernias noted. Extremities:  No clubbing, cyanosis, edema, or deformity noted with normal full range of motion of all joints.     Impression & Recommendations:  Problem # 1:  BACTERIAL PNEUMONIA (ICD-482.9) Assessment New  His updated medication list for this problem includes:  Avelox 400 mg for 2 more days then  start diclofenac 100 mg b.i.d. for 7 days  Problem # 2:  GERD  (ICD-530.81) Assessment: Improved  His updated medication list for this problem includes:    Nexium 40 Mg Cpdr (Esomeprazole magnesium) .Marland Kitchen... 1 by mouth once daily    Omeprazole 40 Mg Cpdr (Omeprazole) .Marland Kitchen... 1 once daily for gerd  Problem # 3:  ARTHRITIS, CERVICAL SPINE (ICD-721.90) Assessment: Improved  Problem # 4:  COPD (ICD-496) Assessment: Unchanged  His updated medication list for this problem includes:    Spiriva Handihaler 18 Mcg Caps (Tiotropium bromide monohydrate) ..... Inhale 1 capsule by mouth once a day    Singulair 10 Mg Tabs (Montelukast sodium) .Marland Kitchen... Take 1 tablet by mouth once a day  Complete Medication List: 1)  Adult Aspirin Low Strength 81 Mg Chew (Aspirin) .... Take 1 tablet by mouth once a  day 2)  Avodart 0.5 Mg Caps (Dutasteride) .... Take 1 capsule by mouth once a day 3)  Nexium 40 Mg Cpdr (Esomeprazole magnesium) .Marland Kitchen.. 1 by mouth once daily 4)  Mucinex 600 Mg Tb12 (Guaifenesin) .... Take 1 tablet by mouth twice a day 5)  Spiriva Handihaler 18 Mcg Caps (Tiotropium bromide monohydrate) .... Inhale 1 capsule by mouth once a day 6)  Singulair 10 Mg Tabs (Montelukast sodium) .... Take 1 tablet by mouth once a day 7)  Flomax 0.4 Mg Xr24h-cap (Tamsulosin hcl) .Marland Kitchen.. 1 by mouth once daily 8)  Hydrocodone-homatropine 5-1.5 Mg/40ml Syrp (Hydrocodone-homatropine) .Marland Kitchen.. 1-2 tsp every 4-6 hrs as needed cough 9)  Cefdinir 300 Mg Caps (Cefdinir) .... 2 cap once daily by dr. Maple Hudson 10)  Ibuprofen 200 Mg Tabs (Ibuprofen) .... 2 three times a day pc 11)  Tramadol Hcl 50 Mg Tabs (Tramadol hcl) .Marland Kitchen.. 1-2 tabs qid to control cough 12)  Diclofenac Sodium 75 Mg Tbec (Diclofenac sodium) .Marland Kitchen.. 1 two times a day pc for  arthritis neck 13)  Omeprazole 40 Mg Cpdr (Omeprazole) .Marland Kitchen.. 1 once daily for gerd  Patient Instructions: 1)  diagnosis of pneumonia right chest 2)  Patient Avelox and start diclofenac 3)  Attending usual COPD medications and your cough is 4)  Continue your other  medications as instructed

## 2010-09-07 NOTE — Assessment & Plan Note (Signed)
Summary: Pulmonary/ ext post hosp f/u ov   Primary Provider/Referring Provider:  [name]  CC:  Post hospital followup.  Pt states "breathing is terrible"- gets out of breath walking frokm one room to the next.  He also c/o cough- sometimes prod with clear sputum. He c/o no appetite and "no taste".  He has been sleeping in a recliner.Richard [name]  History of Present Illness: 75 yowm never smoker with dx of bronchiectasis dx with WG 03/2010  July 08, 2009- Bronchiectasis, chronic bronchits, allergic rhinitis Had flu vax and has had at least 2 pneumovax.   January 20, 2010- Bronchiectasis, chronic bronchitis, allergic rhinitis Dr Scotty Court treated for pneumonia in May. Went to ER and was treated with Rocephin/ zith, extended as zithromax. He was able to go on to his vacation in [place] without problems. He goes about 3 weeks between" build-ups", at which time he needs antibiotic. He had taken 3 days of augnmentin just  before the pneumonia.   March 24, 2010 finished Avelox 8/12> sputum  less dark but yellow then doxy on 8/8  p dx with pna in ER,    Admit [facility]  dx WG by pos c anca 03/2010  Admit [facility] dx spont L Ptx 9/08/15/09 requiring Chest tube but no surgery  October  [birth year], 2011 Post hospital followup.  Pt states "breathing is terrible"- gets out of breath walking frokm one room to the next.  He also c/o cough- sometimes prod with clear sputum. He c/o no appetite and "no taste".  He has been sleeping in a recliner.  Pt denies any significant sore throat, dysphagia, itching, sneezing,  nasal congestion or excess secretions,  fever, chills, sweats, unintended wt loss, pleuritic or exertional cp, hempoptysis, change in activity tolerance  orthopnea pnd or leg swelling   Current Medications (verified): 1)  Pantoprazole Sodium 40 Mg Tbec (Pantoprazole Sodium) .Richard [name].. 1 Once Daily 2)  Adult Aspirin Low Strength 81 Mg Chew (Aspirin) .... Take 1 Tablet By Mouth Once A  Day 3)  Avodart 0.5 Mg Caps (Dutasteride) .Richard [name]..  1 Every Other Day 4)  Flomax 0.4 Mg Xr24h-Cap (Tamsulosin Hcl) .Richard [name].. 1 By Mouth Once Daily 5)  Xopenex Hfa 45 Mcg/act Aero (Levalbuterol Tartrate) .... 2 Puffs Every 4 Hrs As Needed 6)  Mucinex Dm 30-600 Mg Xr12h-Tab (Dextromethorphan-Guaifenesin) .Richard [name].. 1 Two Times A Day 7)  Hydromet 5-1.5 Mg/53ml Syrp (Hydrocodone-Homatropine) .Richard [name].. 1 To 2 Tsp Every 4 Hrs As Needed For Cough 8)  Dulcolax 5 Mg Tbec (Bisacodyl) .... As Per Bottle Directions As Needed 9)  Bisoprolol Fumarate 5 Mg Tabs (Bisoprolol Fumarate) .... 1/2 Once Daily 10)  Cyclophosphamide 25 Mg Tabs (Cyclophosphamide) .... 3 Once Daily 11)  Prednisone 20 Mg Tabs (Prednisone) .Richard [name].. 1 Once Daily 12)  Sulfamethoxazole-Tmp Ds 800-160 Mg Tabs (Sulfamethoxazole-Trimethoprim) .Richard [name].. 1 Every Mon Wed and Fri 13)  Brovana 15 Mcg/33ml Nebu (Arformoterol Tartrate) .Richard [name].. 1 in Nebulizer Two Times A Day About To Run Out On 10/4 14)  Budesonide 0.5 Mg/65ml Susp (Budesonide) .Richard [name].. 1 in Nebulizer Two Times A Day  Allergies (verified): No Known Drug Allergies  Past History:  Past Medical History: Allergic rhinitis GERD- hiatal hernia with reflux     - Repeat Barium Swallow c/w stricture and reflux 03/29/10 Bronchiectasis     - See CT chest 12/14/2009 > progressive bilateral R> L cavities 03/26/10 > clinical dx Wegener's 04/07/10      - PFTs 02/17/10  FEV1 1.52 (58%) ratio 42 and 15% better after B2, DLC0 118%  L PTX  04/25/10 benign hypertrophy of the prostate  Vital Signs:  Patient profile:   75 year old male Weight:      110 pounds O2 Sat:      100 % on 2 L/min Temp:     97.9 degrees F oral Pulse rate:   100 / minute BP sitting:   98 / 62  (left arm)  Vitals Entered By: Vernie Murders (May 10, 2010 2:15 PM)  O2 Flow:  2 L/min  Physical Exam  Additional Exam:  General: A/Ox3; depressed affect and mood Wt 130 > 110 May 10, 2010 chronically ill wheelchair bound.  SKIN: no rash, lesions NODES: no lymphadenopathy HEENT: [place]/AT, EOM- WNL,  Conjuctivae- clear, PERRLA, TM-WNL, Nose- clear, Throat- clear and wnl, Mallampati  III NECK: Supple w/ fair ROM, JVD- none, normal carotid impulses w/o bruits Thyroid-  CHEST: Mininimal exp rhonchi bilaterally HEART: RRR, no m/g/r heard ABDOMEN: Soft and nl; BJY:NWGN, nl pulses, no edema, clubbing or cyanosis  Skin:  Chest wall chest tube site ok    Sodium               [L]  134 mEq/L                   135-145   Potassium            [H]  5.6 mEq/L                   3.5-5.1   Chloride                  98 mEq/L                    96-112   Carbon Dioxide            27 mEq/L                    19-32   Glucose              [H]  161 mg/dL                   56-21   BUN                  [H]  28 mg/dL                    3-08   Creatinine                1.2 mg/dL                   6.5-7.8   Calcium              [L]  8.0 mg/dL                   4.6-96.2   GFR                       62.67 mL/min                >60  Tests: (2) CBC Platelet w/Diff (CBCD)   White Cell Count     [H]  10.8 K/uL                   4.5-10.5   Red Cell Count       [L]  3.22 Mil/uL  4.22-5.81   Hemoglobin           [L]  9.7 g/dL                    47.4-25.9   Hematocrit           [L]  29.5 %                      39.0-52.0   MCV                       91.7 fl                     78.0-100.0   MCHC                      32.8 g/dL                   56.3-87.5   RDW                  [H]  21.8 %                      11.5-14.6   Platelet Count       [H]  560.0 K/uL                  150.0-400.0     Rechecked and verified result.   Neutrophil %         [H]  87.0 %                      43.0-77.0   Lymphocyte %         [L]  7.6 %                       12.0-46.0   Monocyte %                5.1 %                       3.0-12.0   Eosinophils%              0.2 %                       0.0-5.0   Basophils %               0.1 %                       0.0-3.0   Neutrophill Absolute [H]  9.4 K/uL                    1.4-7.7    Lymphocyte Absolute       0.8 K/uL                    0.7-4.0   Monocyte Absolute         0.5 K/uL                    0.1-1.0  Eosinophils, Absolute                             0.0 K/uL  0.0-0.7   Basophils Absolute        0.0 K/uL                    0.0-0.1   Impression & Recommendations:  Problem # 1:  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  This improved from  50-75% so try symbicort for now  Each maintenance medication was reviewed in detail including most importantly the difference between maintenance prns and under what circumstances the prns are to be used.  In addition, these two groups (for which the patient should keep up with refills) were distinguished from a third group :  meds that are used only short term with the intent to complete a course of therapy and then not refill them.  The med list was then fully reconciled and reorganized to reflect this important distinction. needs formal med rec ov. See instructions for specific recommendations   Problem # 3:  ANEMIA (ICD-285.9) Normocytic most likely from chronic illness and ctx, follow for now  Medications Added to Medication List This Visit: 1)  Pantoprazole Sodium 40 Mg Tbec (Pantoprazole sodium) .Richard [name].. 1 once daily 2)  Pantoprazole Sodium 40 Mg Tbec (Pantoprazole sodium) .... Take  one 30-60 min before first meal of the day 3)  Prednisone 20 Mg Tabs (Prednisone) .... With breakfast 4)  Avodart 0.5 Mg Caps (Dutasteride) .Richard [name].. 1 every other day 5)  Cyclophosphamide 25 Mg Tabs (Cyclophosphamide) .... 3 once daily 6)  Bisoprolol Fumarate 5 Mg Tabs (Bisoprolol fumarate) .... 1/2 once daily 7)  Sulfamethoxazole-tmp Ds  800-160 Mg Tabs (Sulfamethoxazole-trimethoprim) .Richard [name].. 1 every mon wed and fri 8)  Brovana 15 Mcg/75ml Nebu (Arformoterol tartrate) .Richard [name].. 1 in nebulizer two times a day about to run out on 10/4 9)  Budesonide 0.5 Mg/56ml Susp (Budesonide) .Richard [name].. 1 in nebulizer two times a day 10)  *02 2lpm At Rest and If Short of Breath Waliking 3 Lpm  11)  Prednisone 20 Mg Tabs (Prednisone) .Richard [name].. 1 once daily 12)  Xopenex Hfa 45 Mcg/act Aero (Levalbuterol tartrate) .... 2 puffs every 4 hrs as needed 13)  Mucinex Dm 30-600 Mg Xr12h-tab (Dextromethorphan-guaifenesin) .Richard [name].. 1 two times a day 14)  Hydromet 5-1.5 Mg/43ml Syrp (Hydrocodone-homatropine) .Richard [name].. 1 to 2 tsp every 4 hrs as needed for cough 15)  Dulcolax 5 Mg Tbec (Bisacodyl) .... As per bottle directions as needed  Other Orders: TLB-BMP (Basic Metabolic Panel-BMET) (80048-METABOL) TLB-CBC Platelet - w/Differential (85025-CBCD) Est. Patient Level IV (16109)  Patient Instructions: 1)  See Tammy NP w/in 2 weeks with all your medications, even over the counter meds, separated in two separate bags, the ones you take no matter what vs the ones you stop once you feel better and take only as needed.  She will generate for you a new user friendly medication calendar that will put [name] all on the same page re: your medication use. 2)  Symbicort 160 = Brovana and Budesonide, the equivalent of Symbicort, per neb   and symbicort dose is 2 puffs first thing  in am and 2 puffs again in pm about 12 hours later

## 2010-09-07 NOTE — Assessment & Plan Note (Signed)
Summary: follow up from ER/spitting up blood/pneumonia/pt rsc per gina...   Vital Signs:  Patient profile:   75 year old male O2 Sat:      96 % Temp:     98.2 degrees F Pulse rate:   73 / minute BP sitting:   140 / 84  (left arm)  Vitals Entered By: Pura Spice, RN (Dec 15, 2009 4:15 PM) CC: went to [facility] ED for Hemoptysis had CXR AND CT. still some coough congestion has rx for 3 pills of zithromycin. wants to go to [facility] tomorrow will be flying. saw dr young about 3-4 months ago.    History of Present Illness: This 75 year old white male with COPD, chronic bronchitis was seen in the emergency room on 9 May With history of having hemoptysis x3 no large amount of blood or bright blood rate occasional the last time being 8/10 with blood. He had a chest x-ray and CT scan which revealed 3 areas of pneumonitis in the right lung and with findings of his COPD and chronic bronchitis. He was treated with Rocephin IV and given 3 azithromycin tablet and was told to see me today. He was treated in this office on April 24 bronchitis and felt that he was improving except had a coughing episode yesterday then resolving in the hemoptysis. Today he has some cough and congestion no bleeding there is plantar to go to [facility] on May 11 if at all possible. He relates he has been afebrile and in general has not felt badly, maybe slight weakness  Allergies (verified): No Known Drug Allergies  Past History:  Past Medical History: Last updated: 10/01/2009 Allergic rhinitis GERD- hiatal hernia with reflux Chronic bronchitis/ probable bronchiectasis COPD  Past Surgical History: Last updated: 01/25/2007 HERNIA REPAIR  Social History: Last updated: 02/07/2008 Retired Non smoker Married with 3 children  Risk Factors: Smoking Status: never (01/25/2007)  Review of Systems      See HPI General:  See HPI; Denies chills, fatigue, fever, loss of appetite, malaise, sleep disorder, sweats, weakness, and weight  loss. Eyes:  Denies blurring, discharge, double vision, eye irritation, eye pain, halos, itching, light sensitivity, red eye, vision loss-1 eye, and vision loss-both eyes. ENT:  Denies decreased hearing, difficulty swallowing, ear discharge, earache, hoarseness, nasal congestion, nosebleeds, postnasal drainage, ringing in ears, sinus pressure, and sore throat. CV:  Denies bluish discoloration of lips or nails, chest pain or discomfort, difficulty breathing at night, difficulty breathing while lying down, fainting, fatigue, leg cramps with exertion, lightheadness, near fainting, palpitations, shortness of breath with exertion, swelling of feet, swelling of hands, and weight gain. Resp:  Complains of cough, coughing up blood, and sputum productive. GI:  Denies abdominal pain, bloody stools, change in bowel habits, constipation, dark tarry stools, diarrhea, excessive appetite, gas, hemorrhoids, indigestion, loss of appetite, nausea, vomiting, vomiting blood, and yellowish skin color. GU:  Denies decreased libido, discharge, dysuria, erectile dysfunction, genital sores, hematuria, incontinence, nocturia, urinary frequency, and urinary hesitancy. MS:  Denies joint pain, joint redness, joint swelling, loss of strength, low back pain, mid back pain, muscle aches, muscle , cramps, muscle weakness, stiffness, and thoracic pain.  Physical Exam  General:  Well-developed,well-nourished,in no acute distress; alert,appropriate and cooperative throughout examination Head:  Normocephalic and atraumatic without obvious abnormalities. No apparent alopecia or balding. Eyes:  No corneal or conjunctival inflammation noted. EOMI. Perrla. Funduscopic exam benign, without hemorrhages, exudates or papilledema. Vision grossly normal. Ears:  External ear exam shows no significant lesions or deformities.  Otoscopic examination reveals clear canals, tympanic membranes are intact bilaterally without bulging, retraction,  inflammation or discharge. Hearing is grossly normal bilaterally. Nose:  minimal nasal congestion Mouth:  Oral mucosa and oropharynx without lesions or exudates.  Teeth in good repair. Neck:  No deformities, masses, or tenderness noted. Chest Wall:  No deformities, masses, tenderness or gynecomastia noted. Lungs:  rhonchi at bilaterally no rales right base no dullness to percussion and no dyspnea Heart:  Normal rate and regular rhythm. S1 and S2 normal without gallop, murmur, click, rub or other extra sounds. Extremities:  No clubbing, cyanosis, edema, or deformity noted with normal full range of motion of all joints.     Impression & Recommendations:  Problem # 1:  PNEUMONIA (ICD-486)  His updated medication list for this problem includes:    Amoxicillin-pot Clavulanate 875-125 Mg Tabs (Amoxicillin-pot clavulanate) .Marland Kitchen... 1 twice daily x 7 days for bronchitis    Zithromax Z-pak 250 Mg Tabs (Azithromycin) .Marland Kitchen... 2 stat then 1 qd 2 repeat Rocephin on 511 Orders: Rocephin  250mg  (B2841) Admin of Therapeutic Inj  intramuscular or subcutaneous (32440) Prescription Created Electronically [phone])  Problem # 2:  COUGH (ICD-786.2) Assessment: Improved  Problem # 3:  COPD (ICD-496) Assessment: Unchanged  His updated medication list for this problem includes:    Spiriva Handihaler 18 Mcg Caps (Tiotropium bromide monohydrate) ..... Inhale 1 capsule by mouth once a day    Singulair 10 Mg Tabs (Montelukast sodium) .Marland Kitchen... Take 1 tablet by mouth once a day  Complete Medication List: 1)  Adult Aspirin Low Strength 81 Mg Chew (Aspirin) .... Take 1 tablet by mouth once a  day 2)  Avodart 0.5 Mg Caps (Dutasteride) .... Take 1 capsule by mouth once a day 3)  Nexium 40 Mg Cpdr (Esomeprazole magnesium) .Marland Kitchen.. 1 by mouth once daily 4)  Mucinex 600 Mg Tb12 (Guaifenesin) .... Take 1 tablet by mouth twice a day 5)  Spiriva Handihaler 18 Mcg Caps (Tiotropium bromide monohydrate) .... Inhale 1 capsule by mouth  once a day 6)  Singulair 10 Mg Tabs (Montelukast sodium) .... Take 1 tablet by mouth once a day 7)  Flomax 0.4 Mg Xr24h-cap (Tamsulosin hcl) .Marland Kitchen.. 1 by mouth once daily 8)  Amoxicillin-pot Clavulanate 875-125 Mg Tabs (Amoxicillin-pot clavulanate) .Marland Kitchen.. 1 twice daily x 7 days for bronchitis 9)  Hydrocodone-homatropine 5-1.5 Mg/10ml Syrp (Hydrocodone-homatropine) .Marland Kitchen.. 1-2 tsp every 4-6 hrs as needed cough 10)  Mucinex Dm Maximum Strength 60-1200 Mg Xr12h-tab (Dextromethorphan-guaifenesin) .... One b.i.d. 11)  Zithromax Z-pak 250 Mg Tabs (Azithromycin) .... 2 stat then 1 qd  Patient Instructions: 1)  3 areas of pneumonia in the right base as well seen in the hemoptysis and was short history of chronic bronchitis and COPD this is not an unusual problem 2)  No nodule or lesions otherwise 3)  2 repeat Rocephin 1 g I am on 511 4)  Continue her other medicationsStart a Z-Pak today Prescriptions: ZITHROMAX Z-PAK 250 MG TABS (AZITHROMYCIN) 2 stat then 1 qd  #1 pkg x 1   Entered and Authorized by:   Judithann Sheen MD   Signed by:   Judithann Sheen MD on 12/15/2009   Method used:   Electronically to        [id number] (retail)       [address] [place], [place]  [address]       Ph: [phone]  Fax: [phone]   RxID:   [id number] HYDROCODONE-HOMATROPINE 5-1.5 MG/5ML SYRP (HYDROCODONE-HOMATROPINE) 1-2 tsp every 4-6 hrs as needed cough  #240 cc x 3   Entered and Authorized by:   Judithann Sheen MD   Signed by:   Judithann Sheen MD on 12/15/2009   Method used:   Print then Give to Patient   RxID:   [id number]    Medication Administration  Injection # 1:    Medication: Rocephin  250mg     Diagnosis: PNEUMONIA (ICD-486)    Route: IM    Site: RUOQ gluteus    Exp Date: 06/2012    Lot #: [id number]    Mfr: [email or web address]    Comments: 1 gram given     Patient tolerated injection without complications    Given by: Pura Spice, RN (Dec 15, 2009 5:23 PM)  Orders Added: 1)  Rocephin  250mg  [[id number]] 2)  Admin of Therapeutic Inj  intramuscular or subcutaneous [96372] 3)  Prescription Created Electronically [[id number]] 4)  Est. Patient Level IV [36644]

## 2010-09-07 NOTE — Progress Notes (Signed)
Summary: refill antibiotic?  Phone Note Call from Patient   Caller: Patient Call For: Judithann Sheen MD Summary of Call: Pt is still weak, and coughing.  No fever, but wants to know if he should get his antibiotic refilled.  Does not want to come in today. [phone] Initial call taken by: Lynann Beaver CMA,  Dec 24, 2009 11:31 AM  Follow-up for Phone Call        pt has apppt today he decided to see dr.  Follow-up by: Pura Spice, RN,  Dec 24, 2009 1:47 PM

## 2010-09-07 NOTE — Progress Notes (Signed)
Summary: weak > to ER  Phone Note Call from Patient Call back at Home Phone [phone] Call back at Work Phone [phone]   Caller: wife-shirley  Call For: wert Summary of Call: pt is not eating or drinking weak cant hardly walk started on leviquin wed try the house number first Initial call taken by: Lacinda Axon,  March 26, 2010 12:45 PM  Follow-up for Phone Call        MW,pt is not any better. Ok to add to your schedule this pm? Reynaldo Minium CMA  March 26, 2010 12:46 PM  sounds like getting dehydrated and had abd pain on last ov so should go to ER to sort this out before the weekend gets here. they will call us if needed Follow-up by: [name] MD,  March 26, 2010 12:59 PM  Additional Follow-up for Phone Call Additional follow up Details #1::        Called and spoke with Shirley(pts wife) she is aware to go ahead and take pt to the ER to get checked out and if pulmonary dr is needed the hospital will contact Md in the hosptial to assess pt.Reynaldo Minium CMA  March 26, 2010 2:11 PM

## 2010-09-07 NOTE — Progress Notes (Signed)
Summary: echo results   Phone Note Call from Patient   Caller: Patient [phone] Reason for Call: Talk to Nurse, Lab or Test Results Summary of Call: echo results Initial call taken by: Glynda Jaeger,  July 14, 2010 2:23 PM  Follow-up for Phone Call        [facility] that we need to discuss results of Doub.nuc study with Dr.Munir Victorian before giving him the results and that we will call him back.  Layne Benton, RN, BSN  July 14, 2010 6:31 PM   Additional Follow-up for Phone Call Additional follow up Details #1::        Reviewed results of myoview with patient.  Inf wall with evidence of ischemia and scar, cannot completely exclude soft tissue attenuation With symtoms I would recomm cath to define anatomy.  Patient would like to review when seen in pulmonary next week.  Ques whether meds interfering.  have asked him to have next wks notes forwarded back to me. Additional Follow-up by: Sherrill Raring, MD, [facility],  July 15, 2010 10:35 PM

## 2010-09-07 NOTE — Progress Notes (Signed)
Summary: Approval for Brovana and Budesonide> hold for now  Phone Note Call from Patient   Caller: Spouse shirley Call For: Richard [name] Summary of Call: arformoterol 15mg -2ml nebsoln and budesonide 0.5mg /ml respule was filed under wrong part of medicare.please call spouse when this is done.  [facility] [address] Initial call taken by: Rickard Patience,  May 06, 2010 8:27 AM  Follow-up for Phone Call        Called pharmacy to ask if they can file this under medicare part B.  Was advised by pharmacist that they will need pt to bring in his medicare part B card.  I called and spoke with pt's spouse who states that they already did this yesterday and gave cards to pharmacist there named [name]. Called pharmacy back and was placed on hold for over 5 minutes.  Will call back again later today and request to speak with Eugene Gavia  May 06, 2010 9:28 AM   Additional Follow-up for Phone Call Additional follow up Details #1::        needs prior auth for brovana and pulmicort. call [phone] (medicare). medicare # for pt is [id number]. caller is Scientist, research (medical) at [employer] [phone] [name], CNA,  May 06, 2010 12:43 PM  Wife called back checking on status of prior auth and I advised we are in the process of getting the meds approved. I advised we will call once we receive an approval or denial. [name] CMA  May 06, 2010 4:00 PM we could give her samples while we are trying to work this out - brovana is more impt than budesonide as he is on prednisone already anyway Additional Follow-up by: [name] MD,  May 07, 2010 12:34 PM    Additional Follow-up for Phone Call Additional follow up Details #2::    called [employer] and was informed that not only Budesonide needs a prior auth (which RB has filled out and we are waiting on a response--see phone from 05/03/2010 for complete details )  but NOW Brovana needs a prior auth as well.  Requested prior  auth form from [name] be faxed to our office.  Received prior auth form and gave to Lawson Fiscal to have MD fill out. [name] Reynolds LPN  May 06, 2010 4:02 PM   [name] did not want to do the PA for Brovana because this is [name] patient. I have placed the PA form in MW's look at cabinet for him to review and sign once he is back in the office.[name] [facility]  May 06, 2010 5:29 PM Pt given symbicort sample in office, will hold off on brovana and budesonide for now Follow-up by: [name] MD,  May 11, 2010 10:54 AM

## 2010-09-07 NOTE — Progress Notes (Signed)
Summary: continue brovana and budesonide?  > change to symbicort for now  Phone Note Refill Request Message from:  Fax from Pharmacy on May 13, 2010 12:31 PM  Refills Requested: Medication #1:  BROVANA 15 MCG/2ML NEBU 1 in nebulizer two times a day ABOUT TO RUN OUT ON 10/4   Dosage confirmed as above?Dosage Confirmed   Supply Requested: 1 month  Medication #2:  BUDESONIDE 0.5 MG/2ML SUSP 1 in nebulizer two times a day   Dosage confirmed as above?Dosage Confirmed   Supply Requested: 1 month received fax from [facility] [address] requesting a dx code and specific sig (states "bid") so that Medicare Part B can be billed.  per 9.29.11 phone note, MW states pt was given samples of symbicort so HOLD the brovana and budesonide.  please advise, thanks!  Initial call taken by: Boone Master CNA/MA,  May 13, 2010 12:35 PM  Follow-up for Phone Call        he's ok for now just using symbicort and holding off on the brovana and budesonide Follow-up by: [name] MD,  May 13, 2010 1:23 PM

## 2010-09-07 NOTE — Assessment & Plan Note (Signed)
Summary: FLU-SHOT/RCD   Impression & Recommendations:    Flu Vaccine Consent Questions     Do you have a history of severe allergic reactions to this vaccine? no    Any prior history of allergic reactions to egg and/or gelatin? no    Do you have a sensitivity to the preservative Thimersol? no    Do you have a past history of Guillan-Barre Syndrome? no    Do you currently have an acute febrile illness? no    Have you ever had a severe reaction to latex? no    Vaccine information given and explained to patient? yes    Are you currently pregnant? no    Lot Number:[id number]   Site Given  Left Deltoid IM....Marland KitchenMarland KitchenPura Spice, RN  May 22, 2008 2:22 PM    Complete Medication List: 1)  Adult Aspirin Low Strength 81 Mg Chew (Aspirin) .... Take 1 tablet by mouth once a  day 2)  Avodart 0.5 Mg Caps (Dutasteride) .... Take 1 capsule by mouth once a day 3)  Flomax 0.4 Mg Cp24 (Tamsulosin hcl) .... Take 1 capsule by mouth once a day 4)  Mucinex 600 Mg Tb12 (Guaifenesin) .... Take 1 tablet by mouth twice a day 5)  Singulair 10 Mg Tabs (Montelukast sodium) .... Take 1 tablet by mouth once a day 6)  Spiriva Handihaler 18 Mcg Caps (Tiotropium bromide monohydrate) .... Inhale 1 capsule by mouth once a day 7)  Hydrocodone-homatropine 5-1.5 Mg/56ml Syrp (Hydrocodone-homatropine) .... 2 tsp every 4 hrs as needed cough 8)  Amoxicillin 500 Mg Tabs (Amoxicillin) .Marland Kitchen.. 1 three times a day 9)  Nexium 40 Mg Cpdr (Esomeprazole magnesium) .Marland Kitchen.. 1 by mouth once daily   Nurse Visit    Prior Medications: ADULT ASPIRIN LOW STRENGTH 81 MG CHEW (ASPIRIN) Take 1 tablet by mouth once a  day AVODART 0.5 MG CAPS (DUTASTERIDE) Take 1 capsule by mouth once a day FLOMAX 0.4 MG CP24 (TAMSULOSIN HCL) Take 1 capsule by mouth once a day MUCINEX 600 MG TB12 (GUAIFENESIN) Take 1 tablet by mouth twice a day SINGULAIR 10 MG TABS (MONTELUKAST SODIUM) Take 1 tablet by mouth once a day SPIRIVA HANDIHALER 18 MCG CAPS  (TIOTROPIUM BROMIDE MONOHYDRATE) Inhale 1 capsule by mouth once a day HYDROCODONE-HOMATROPINE 5-1.5 MG/5ML  SYRP (HYDROCODONE-HOMATROPINE) 2 tsp every 4 hrs as needed cough AMOXICILLIN 500 MG  TABS (AMOXICILLIN) 1 three times a day NEXIUM 40 MG  CPDR (ESOMEPRAZOLE MAGNESIUM) 1 by mouth once daily Current Allergies: No known allergies     Orders Added: 1)  Admin 1st Vaccine [90471] 2)  Flu Vaccine 74yrs + [place].Dew    ]

## 2010-09-07 NOTE — Miscellaneous (Signed)
Summary: Order/[facility]  Order/[facility]   Imported By: Sherian Rein 05/31/2010 07:07:22  _____________________________________________________________________  External Attachment:    Type:   Image     Comment:   External Document

## 2010-09-07 NOTE — Progress Notes (Signed)
Summary: nuc pre-procedure  Phone Note Outgoing Call   Call placed by: Harlow Asa CNMT Call placed to: Patient Reason for Call: Confirm/change Appt Summary of Call: Reviewed information on Myoview Information Sheet (see scanned document for further details).  Spoke with patient.      Nuclear Med Background Indications for Stress Test: Evaluation for Ischemia   History: Asthma, COPD, Echo, Myocardial Perfusion Study  History Comments: '08 MPI-nml; 8/11 Echo low nml EF  Symptoms: Chest Pain, SOB  Symptoms Comments: bilateral arm pain   Nuclear Pre-Procedure Cardiac Risk Factors: Family History - CAD, Lipids Height (in): 68

## 2010-09-07 NOTE — Assessment & Plan Note (Signed)
Summary: KNOT ON R LEG / SORE, PAINFUL TO TOUCH // RS   Vital Signs:  Patient profile:   75 year old male Weight:      142 pounds O2 Sat:      92 % Temp:     98.4 degrees F Pulse rate:   78 / minute Pulse rhythm:   regular BP sitting:   120 / 80  (left arm) Cuff size:   regular  Vitals Entered By: Pura Spice, RN (January 13, 2010 11:28 AM) CC: knot rt ankle  been there since may 20    History of Present Illness: This 75 year old white male who noticed a knot or lymph node on the right ankle area which had been present for several days but appears to be improving and slightly smaller in size and tender  Lung congestion has been improved  Allergies (verified): No Known Drug Allergies  Past History:  Past Medical History: Last updated: 10/01/2009 Allergic rhinitis GERD- hiatal hernia with reflux Chronic bronchitis/ probable bronchiectasis COPD  Past Surgical History: Last updated: 01/25/2007 HERNIA REPAIR  Social History: Last updated: 02/07/2008 Retired Non smoker Married with 3 children  Risk Factors: Smoking Status: never (01/25/2007)  Review of Systems  The patient denies anorexia, fever, weight loss, weight gain, vision loss, decreased hearing, hoarseness, chest pain, syncope, dyspnea on exertion, peripheral edema, prolonged cough, headaches, hemoptysis, abdominal pain, melena, hematochezia, severe indigestion/heartburn, hematuria, incontinence, genital sores, muscle weakness, suspicious skin lesions, transient blindness, difficulty walking, depression, unusual weight change, abnormal bleeding, enlarged lymph nodes, angioedema, breast masses, and testicular masses.    Physical Exam  General:  Well-developed,well-nourished,in no acute distress; alert,appropriate and cooperative throughout examination Lungs:  decreased breath sounds, no rhonchi no rales no dullness to percussion Heart:  Normal rate and regular rhythm. S1 and S2 normal without gallop, murmur,  click, rub or other extra sounds. Msk:  left-sided and slight area which is only minimal to moderate tenderness movable node   Impression & Recommendations:  Problem # 1:  LYMPHADENOPATHY (ICD-785.6)  His updated medication list for this problem includes:      Zithromax Z-pak 250 Mg Tabs (Azithromycin) .Marland Kitchen... 2 stat then 1 qd  Problem # 2:  COPD (ICD-496) Assessment: Unchanged  His updated medication list for this problem includes:    Spiriva Handihaler 18 Mcg Caps (Tiotropium bromide monohydrate) ..... Inhale 1 capsule by mouth once a day    Singulair 10 Mg Tabs (Montelukast sodium) .Marland Kitchen... Take 1 tablet by mouth once a day  Problem # 3:  COUGH (ICD-786.2) Assessment: Improved  Problem # 4:  GERD (ICD-530.81) Assessment: Improved  His updated medication list for this problem includes:    Nexium 40 Mg Cpdr (Esomeprazole magnesium) .Marland Kitchen... 1 by mouth once daily  Complete Medication List: 1)  Adult Aspirin Low Strength 81 Mg Chew (Aspirin) .... Take 1 tablet by mouth once a  day 2)  Avodart 0.5 Mg Caps (Dutasteride) .... Take 1 capsule by mouth once a day 3)  Nexium 40 Mg Cpdr (Esomeprazole magnesium) .Marland Kitchen.. 1 by mouth once daily 4)  Mucinex 600 Mg Tb12 (Guaifenesin) .... Take 1 tablet by mouth twice a day 5)  Spiriva Handihaler 18 Mcg Caps (Tiotropium bromide monohydrate) .... Inhale 1 capsule by mouth once a day 6)  Singulair 10 Mg Tabs (Montelukast sodium) .... Take 1 tablet by mouth once a day 7)  Flomax 0.4 Mg Xr24h-cap (Tamsulosin hcl) .Marland Kitchen.. 1 by mouth once daily 8)  Amoxicillin-pot Clavulanate 875-125 Mg  Tabs (Amoxicillin-pot clavulanate) .Marland Kitchen.. 1 twice daily x 7 days for bronchitis 9)  Hydrocodone-homatropine 5-1.5 Mg/67ml Syrp (Hydrocodone-homatropine) .Marland Kitchen.. 1-2 tsp every 4-6 hrs as needed cough 10)  Mucinex Dm Maximum Strength 60-1200 Mg Xr12h-tab (Dextromethorphan-guaifenesin) .... One b.i.d. 11)  Zithromax Z-pak 250 Mg Tabs (Azithromycin) .... 2 stat then 1 qd  Patient  Instructions: 1)  lymphadenopathy, improving 2)  No antibiotic is needed at this time 3)  There is a change do not hesitate to call me

## 2010-09-07 NOTE — Progress Notes (Signed)
Summary: prescription  Phone Note Call from Patient   Caller: Patient Call For: Kaila Devries Summary of Call: pt calling about prescript for antibiotic Initial call taken by: Rickard Patience,  November 09, 2009 2:34 PM  Follow-up for Phone Call        pt calling for a refill on Amoxicillin  (refill request in Katie's box) Pt c/o chest congestion and coughing up dark yellow sputum.  Pt denied fever, head congestion or sore throat.  Please advise.  thank you.  [name] Reynolds LPN  November 10, 4399 2:51 PM   pharmacy is [facility] on [address].   Additional Follow-up for Phone Call Additional follow up Details #1::        Please let pt know that RX request has been sent.Reynaldo Minium CMA  November 09, 2009 4:11 PM   pt advised. [name] CMA  November 09, 2009 4:12 PM

## 2010-09-07 NOTE — Miscellaneous (Signed)
Summary: Orders/[facility]  Orders/[facility]   Imported By: Lester Laurel 06/03/2010 08:34:31  _____________________________________________________________________  External Attachment:    Type:   Image     Comment:   External Document

## 2010-09-07 NOTE — Assessment & Plan Note (Signed)
Summary: chest congestion//ccm   Vital Signs:  Patient profile:   75 year old male Weight:      144 pounds O2 Sat:      95 % Temp:     98.4 degrees F Pulse rate:   71 / minute BP sitting:   140 / 80  (left arm) Cuff size:   regular  Vitals Entered By: Pura Spice, RN (November 25, 2009 1:01 PM) CC: chest congestin yellow sputum refill singulair  Is Patient Diabetic? No   History of Present Illness: Is a 75 year old white married male who has COPD and chronic bronchitis is in today relating he has had chest congestion with productive yellow sputum over the past 3-4 days. Approximately one month ago he had same problem and was treated, responded her up and doing well until she got coughing congestion and productive sputum has increased in severity Patient desire refill of medications  Allergies (verified): No Known Drug Allergies  Past History:  Past Medical History: Last updated: 10/01/2009 Allergic rhinitis GERD- hiatal hernia with reflux Chronic bronchitis/ probable bronchiectasis COPD  Past Surgical History: Last updated: 01/25/2007 HERNIA REPAIR  Social History: Last updated: 02/07/2008 Retired Non smoker Married with 3 children  Risk Factors: Smoking Status: never (01/25/2007)  Review of Systems      See HPI  The patient denies anorexia, fever, weight loss, weight gain, vision loss, decreased hearing, hoarseness, chest pain, syncope, dyspnea on exertion, peripheral edema, prolonged cough, headaches, hemoptysis, abdominal pain, melena, hematochezia, severe indigestion/heartburn, hematuria, incontinence, genital sores, muscle weakness, suspicious skin lesions, transient blindness, difficulty walking, depression, unusual weight change, abnormal bleeding, enlarged lymph nodes, angioedema, breast masses, and testicular masses.    Physical Exam  General:  Well-developed,well-nourished,in no acute distress; alert,appropriate and cooperative throughout  examination Head:  Normocephalic and atraumatic without obvious abnormalities. No apparent alopecia or balding. Eyes:  No corneal or conjunctival inflammation noted. EOMI. Perrla. Funduscopic exam benign, without hemorrhages, exudates or papilledema. Vision grossly normal. Ears:  External ear exam shows no significant lesions or deformities.  Otoscopic examination reveals clear canals, tympanic membranes are intact bilaterally without bulging, retraction, inflammation or discharge. Hearing is grossly normal bilaterally. Nose:  External nasal examination shows no deformity or inflammation. Nasal mucosa are pink and moist without lesions or exudates. Mouth:  Oral mucosa and oropharynx without lesions or exudates.  Teeth in good repair. Chest Wall:  No deformities, masses, tenderness or gynecomastia noted. Lungs:  rhonchi bilaterally with diminished breath sounds also minimal scar currently is basis plus with occasional rale Heart:  Normal rate and regular rhythm. S1 and S2 normal without gallop, murmur, click, rub or other extra sounds.   Impression & Recommendations:  Problem # 1:  BRONCHITIS, ACUTE WITH MILD BRONCHOSPASM (ICD-466.0) Assessment New  The following medications were removed from the medication list:    Amoxicillin 500 Mg Caps (Amoxicillin) .Marland Kitchen... Take one cap by mouth three times a day    Avelox 400 Mg Tabs (Moxifloxacin hcl) .Marland Kitchen... 1 qd His updated medication list for this problem includes:    Mucinex 600 Mg Tb12 (Guaifenesin) .Marland Kitchen... Take 1 tablet by mouth twice a day    Spiriva Handihaler 18 Mcg Caps (Tiotropium bromide monohydrate) ..... Inhale 1 capsule by mouth once a day    Singulair 10 Mg Tabs (Montelukast sodium) .Marland Kitchen... Take 1 tablet by mouth once a day    Amoxicillin-pot Clavulanate 875-125 Mg Tabs (Amoxicillin-pot clavulanate) .Marland Kitchen... 1 twice daily x 7 days for bronchitis    Hydrocodone-homatropine  5-1.5 Mg/57ml Syrp (Hydrocodone-homatropine) .Marland Kitchen... 1-2 tsp every 4-6 hrs as  needed cough    Mucinex Dm Maximum Strength 60-1200 Mg Xr12h-tab (Dextromethorphan-guaifenesin) ..... One b.i.d.  Orders: Prescription Created Electronically [phone])  Problem # 2:  COPD (ICD-496) Assessment: Unchanged  His updated medication list for this problem includes:    Spiriva Handihaler 18 Mcg Caps (Tiotropium bromide monohydrate) ..... Inhale 1 capsule by mouth once a day    Singulair 10 Mg Tabs (Montelukast sodium) .Marland Kitchen... Take 1 tablet by mouth once a day  Problem # 3:  BENIGN PROSTATIC HYPERTROPHY, HX OF (ICD-V13.8) Assessment: Unchanged  Complete Medication List: 1)  Adult Aspirin Low Strength 81 Mg Chew (Aspirin) .... Take 1 tablet by mouth once a  day 2)  Avodart 0.5 Mg Caps (Dutasteride) .... Take 1 capsule by mouth once a day 3)  Nexium 40 Mg Cpdr (Esomeprazole magnesium) .Marland Kitchen.. 1 by mouth once daily 4)  Mucinex 600 Mg Tb12 (Guaifenesin) .... Take 1 tablet by mouth twice a day 5)  Spiriva Handihaler 18 Mcg Caps (Tiotropium bromide monohydrate) .... Inhale 1 capsule by mouth once a day 6)  Singulair 10 Mg Tabs (Montelukast sodium) .... Take 1 tablet by mouth once a day 7)  Flomax 0.4 Mg Xr24h-cap (Tamsulosin hcl) .Marland Kitchen.. 1 by mouth once daily 8)  Amoxicillin-pot Clavulanate 875-125 Mg Tabs (Amoxicillin-pot clavulanate) .Marland Kitchen.. 1 twice daily x 7 days for bronchitis 9)  Hydrocodone-homatropine 5-1.5 Mg/61ml Syrp (Hydrocodone-homatropine) .Marland Kitchen.. 1-2 tsp every 4-6 hrs as needed cough 10)  Mucinex Dm Maximum Strength 60-1200 Mg Xr12h-tab (Dextromethorphan-guaifenesin) .... One b.i.d.  Patient Instructions: 1)  acute bronchitis with proximal bathroom 2)  Treatment with amoxicillin cluvanate 3)  Good fluid intake 4)  Continue regular medications as Singulair and Spiriva Prescriptions: AMOXICILLIN-POT CLAVULANATE 875-125 MG TABS (AMOXICILLIN-POT CLAVULANATE) 1 twice daily x 7 days for bronchitis  #14 x 1   Entered and Authorized by:   Judithann Sheen MD   Signed by:   Judithann Sheen MD on 11/25/2009   Method used:   Electronically to        [id number] (retail)       [address] [place], [place]  [address]       Ph: [phone]       Fax: [phone]   RxID:   [id number]

## 2010-09-09 NOTE — Progress Notes (Signed)
Summary: cytoxan refill  Phone Note Call from Patient   Caller: Patient Call For: dr [name] Summary of Call: Patient phoned stated that he is having problems getting a refill on his Cyclophospamide 25 mg. He called it into [facility] on [address] and they told him that we sent them a note back stating that Dr. [name] was not the prescribing physician. He stated that Dr. [name] is the doctors  on the bottle and that he saw Dr. [name] on the 17th and stated that Dr [name] told him to conitnue taking his medication and if he needed a refill to call the pharmancy and we would refill. Patient can be reached at [phone] Initial call taken by: Vedia Coffer,  September 03, 2010 10:28 AM  Follow-up for Phone Call        Dr. [name], pt taking cytoxan 75mg  once daily for  [facility] GRANULOMATOSIS.  He is requesting a refill on this medication.  We have never sent refills for this from the office.  Pt states he was discharged from the hospital several months back on this medication and the bottle has your name on it.  Pls advise if you would like to be the prescribing md.  Thanks! Follow-up by: Gweneth Dimitri RN,  September 03, 2010 11:37 AM  Additional Follow-up for Phone Call Additional follow up Details #1::        yes, but be careful with the dosing. the total is 75 mg but I believe it's a 25 mg pill so he takes 3 each day Additional Follow-up by: [name] MD,  September 03, 2010 1:39 PM    Additional Follow-up for Phone Call Additional follow up Details #2::    Rx was sent to pharm.  LMOM for pt to be made aware. Follow-up by: Vernie Murders,  September 03, 2010 2:03 PM  New/Updated Medications: CYCLOPHOSPHAMIDE 25 MG TABS (CYCLOPHOSPHAMIDE) 3 once daily Prescriptions: CYCLOPHOSPHAMIDE 25 MG TABS (CYCLOPHOSPHAMIDE) 3 once daily  #90 x 5   Entered by:   Vernie Murders   Authorized by:   [name] MD   Signed by:   Vernie Murders on 09/03/2010   Method used:   Electronically to        [facility] [address] [id number]* (retail)       [address] [place], [place]  [address]       Ph: [phone]       Fax: [phone]   RxID:   [id number]

## 2010-09-09 NOTE — Assessment & Plan Note (Signed)
Summary: Pulmonary/ ext ov  > reduce pred to 5 mg one half daily   Primary Provider/Referring Provider:  [name]  CC:  cough w/ yellow phlem and breathing is better.  History of Present Illness: 73  yowm never smoker with dx of bronchiectasis dx with  WG 03/2010  July 08, 2009- Bronchiectasis, chronic bronchits, allergic rhinitis Had flu vax and has had at least 2 pneumovax.   January 20, 2010- Bronchiectasis, chronic bronchitis, allergic rhinitis Dr Scotty Court treated for pneumonia in May. Went to ER and was treated with Rocephin/ zith, extended as zithromax. He was able to go on to his vacation in [place] without problems. He goes about 3 weeks between" build-ups", at which time he needs antibiotic. He had taken 3 days of augnmentin just  before the pneumonia.   March 24, 2010 finished Avelox 8/12> sputum  less dark but yellow then doxy on 8/8  p dx with pna in ER,    Admit [facility]  dx WG by pos c anca 03/2010, supportive tbbx but not vats  Admit [name] dx spont L Ptx 9/08/15/09 requiring Chest tube but no surgery     Page 2  05/24/10--Presents for a follow up and med reivew. Since last visit. has been hospitalized 05/12/2010-- 10/10/2011for   Right pneumothorax., with underlying  Wegener granulomatosis. and . Bronchiectasis. Pt was found to have Right Pneumo. Chest tube was placed. with improvement and removed 10/7. Xray on 10/10 w/ resolved pnuemo. He was continued on cytoxan and bactrim for PCP prophylaxis.  He has brought all his meds today and we organized them in a med calendar.  He is still very weak. Breathing is slightly better. but still weak . >>same rx   June 09, 2010 Dyspnea and cough- some improvement.  sev episodes of bilateral shoulder > hand pain waking from sleeep, better with ultram , never with ex, no assoc cp, diaph or nausea or sob.  no hemotysis or other joint co's.  Prednisone 20 mg reduce to one half daily  Aspirin 81 mg one daily with breakfast but stop for any  bleeding See Patient Care Coordinator before leaving for cardiology appt > saw Dr [name],  ? LHC needed   July 21, 2010 ov sob better no longer needing any 02 at all during day, no hemoptysis ,  producing occ yellow mucus - now reports intermittent noct bilateral shoulder pain better with streching, moving, massage. no assoc nausea or sweats.  recd try pred 5 mg daily  August 24, 2010 ov cough w/ yellow phlem, breathing is better, using flutter and med calendar consistently.  R chest pain with deep breathing no change since R chest tube removed in 05/2010. Pt denies any significant sore throat, dysphagia, itching, sneezing,  nasal congestion or excess secretions,  fever, chills, sweats, unintended wt loss,  exertional cp, hempoptysis, change in activity tolerance  orthopnea pnd or leg swelling. Pt also denies any obvious fluctuation in symptoms with weather or environmental change or other alleviating or aggravating factors.          Current Medications (verified): 1)  Symbicort 160-4.5 Mcg/act Aero (Budesonide-Formoterol Fumarate) .... 2 Puffs Two Times A Day 2)  Bisoprolol Fumarate 5 Mg Tabs (Bisoprolol Fumarate) .... 1/2 Once Daily 3)  Flomax 0.4 Mg Xr24h-Cap (Tamsulosin Hcl) .Marland Kitchen.. 1 By Mouth Once Daily 4)  Nexium 40 Mg Cpdr (Esomeprazole Magnesium) .... Once Daily 5)  Cyclophosphamide 25 Mg Tabs (Cyclophosphamide) .... 3 Once Daily 6)  Avodart 0.5 Mg  Caps (Dutasteride) .Marland Kitchen.. 1 Every Other Day 7)  Mucinex Dm 30-600 Mg Xr12h-Tab (Dextromethorphan-Guaifenesin) .Marland Kitchen.. 1 Two Times A Day 8)  Sulfamethoxazole-Tmp Ds 800-160 Mg Tabs (Sulfamethoxazole-Trimethoprim) .Marland Kitchen.. 1 Every Mon Wed and Fri 9)  Dulcolax 5 Mg Tbec (Bisacodyl) .... As Per Bottle Directions As Needed 10)  *02  2lpm At Rest and If Short of Breath Waliking 3 Lpm 11)  Senokot S 8.6-50 Mg Tabs (Sennosides-Docusate Sodium) .... Take As Directed Per Box 12)  Tylenol 325 Mg Tabs (Acetaminophen) .... Take As Directed Per Bottle 13)   Tramadol Hcl 50 Mg Tabs (Tramadol Hcl) .... Take One To Tow Tabs Every 4 Hrs As Needed 14)  Xopenex Hfa 45 Mcg/act Aero (Levalbuterol Tartrate) .... 2 Puffs Every 4 Hrs As Needed 15)  Hydromet 5-1.5 Mg/45ml Syrp (Hydrocodone-Homatropine) .Marland Kitchen.. 1 To 2 Tsp Every 4 Hrs As Needed For Cough 16)  Aspirin 81 Mg Tbec (Aspirin) .... Take One Tablet By Mouth Daily 17)  Prednisone 5 Mg Tabs (Prednisone) .... One Each Am  Allergies (verified): No Known Drug Allergies  Past History:  Past Medical History: Allergic rhinitis GERD- hiatal hernia with reflux     - Repeat Barium Swallow c/w stricture and reflux 03/29/10 Bronchiectasis     - See CT chest 12/14/2009 > progressive bilateral R> L cavities 03/26/10 > clinical dx Wegener's 04/07/10      - PFTs 02/17/10  FEV1 1.52 (58%) ratio 42 and 15% better after B2, DLC0 118% WEGENER'S GRANULOMATOSIS      - dX BY pos C anca/ supportive TBBX 04/10/10 > CYTOXAN initiated PAF/ abn echo during [facility] admit 03/30/10       - ECH0 Aug 23 2011LVH with  low nl ef, mild mr, ok R function, peak trop .7       - Not anticoagulated due to  WG with Hemoptysis L PTX  04/25/10 R PTX 05/12/10  >   final f/u by [name] 06/01/10 benign hypertrophy of the prostate Complex med regimen--Meds reviewed with pt education and computerized med calendar May 25, 2010   Vital Signs:  Patient profile:   75 year old male Height:      67 inches Weight:      120.25 pounds BMI:     18.90 O2 Sat:      98 % on Room air Temp:     98.1 degrees F oral Pulse rate:   80 / minute BP sitting:   110 / 58  (left arm) Cuff size:   regular  Vitals Entered By: Carver Fila (August 24, 2010 11:15 AM)  O2 Flow:  Room air  Physical Exam  Additional Exam:  General: A/Ox3; depressed affect and mood Baseline wt =130 Wt  110 May 10, 2010> > 113 June 09, 2010 > 116 July 21, 2010 > 120 August 24, 2010  chronically amb, up on table without assistance.  SKIN: no rash, lesions NODES: no  lymphadenopathy HEENT: [place]/AT, EOM- WNL, Conjuctivae- clear, PERRLA, TM-WNL, Nose- clear, Throat- clear and wnl, Mallampati  III NECK: Supple w/ fair ROM, JVD- none, normal carotid impulses w/o bruits Thyroid-  CHEST: Mininimal exp rhonchi bilaterally HEART: RRR, no m/g/r heard ABDOMEN: Soft and nl; ZOX:WRUE, nl pulses, no edema, clubbing or cyanosis     White Cell Count          6.5 K/uL                    4.5-10.5     smear reviewed  Red Cell Count       [L]  2.86 Mil/uL                 4.22-5.81   Hemoglobin           [L]  10.2 g/dL                   16.1-09.6   Hematocrit           [L]  29.5 %                      39.0-52.0   MCV                  [H]  103.2 fl                    78.0-100.0   MCHC                      34.5 g/dL                   04.5-40.9   RDW                  [H]  18.7 %                      11.5-14.6   Platelet Count            250.0 K/uL                  150.0-400.0   Neutrophil %         [H]  81.8 %                      43.0-77.0   Lymphocyte %         [L]  5.7 %                       12.0-46.0   Monocyte %                10.8 %                      3.0-12.0   Eosinophils%              1.3 %                       0.0-5.0   Basophils %               0.4 %                       0.0-3.0   Neutrophill Absolute      5.3 K/uL                    1.4-7.7   Lymphocyte Absolute  [L]  0.4 K/uL                    0.7-4.0   Monocyte Absolute         0.7 K/uL                    0.1-1.0  Eosinophils, Absolute  0.1 K/uL                    0.0-0.7   Basophils Absolute        0.0 K/uL                    0.0-0.1  Tests: (2) Sed Rate (ESR)   Sed Rate             [H]  26 mm/hr                    0-22  Tests: (3) BMP (METABOL)   Sodium                    142 mEq/L                   135-145   Potassium                 4.5 mEq/L                   3.5-5.1   Chloride                  109 mEq/L                   96-112   Carbon Dioxide            27 mEq/L                     19-32   Glucose              [H]  101 mg/dL                   29-56   BUN                       23 mg/dL                    2-13   Creatinine                1.4 mg/dL                   0.8-6.5   Calcium                   8.7 mg/dL                   7.8-46.9   GFR                  [L]  51.64 mL/min                >60.00  CXR  Procedure date:  08/24/2010  Findings:      Comparison: 07/21/2010   Findings: Midline trachea.  Normal heart size.  A moderate hiatal hernia with an air-fluid level within the herniated stomach.   No pleural effusion or pneumothorax.  Reticulonodular opacities/marked interstitial thickening throughout the right lung. Stable vague small left upper lobe lung nodules.  No evidence of acute superimposed process.   IMPRESSION: Chronic changes primarily on the right.  No evidence of acute superimposed process.   Moderate hiatal hernia with probable dysmotility.  Impression & Recommendations:  Problem # 1:  WEGENER'S GRANULOMATOSIS (ICD-446.4)  wbc 9.1 > 6.5 on ctx with improving hgb so no adverse effect from ctx and esr nl  therefore No evidence active dz try pred 5 mg one half daily  Problem # 2:  BRONCHIECTASIS (ICD-494.0) pre-existed long time before dx of wg and probably not related.  rec flutter, mucinex dm, continue symbicort  Medications Added to Medication List This Visit: 1)  Nexium 40 Mg Cpdr (Esomeprazole magnesium) .... Once daily  Other Orders: Est. Patient Level IV (99214) T-2 View CXR (71020TC) TLB-CBC Platelet - w/Differential (85025-CBCD) TLB-Sedimentation Rate (ESR) (85652-ESR) TLB-BMP (Basic Metabolic Panel-BMET) (80048-METABOL)  Patient Instructions: 1)  See calendar for specific medication instructions and bring it back for each and every office visit for every healthcare provider you see.  Without it,  you may not receive the best quality medical care that we feel you deserve.  2)  Please schedule a  follow-up appointment in 6 weeks, sooner if needed  3)  LATE ADD reduce pred to 5 mg one half daily

## 2010-09-09 NOTE — Assessment & Plan Note (Signed)
Summary: Pulmonary/ ext ov f/u WG and atypical bilateral arm pains   Primary Torres Hardenbrook/Referring Vishaal Strollo:  [name]  CC:  Cough- improved some .  History of Present Illness: 34  yowm never smoker with dx of bronchiectasis dx with WG 03/2010  July 08, 2009- Bronchiectasis, chronic bronchits, allergic rhinitis Had flu vax and has had at least 2 pneumovax.   January 20, 2010- Bronchiectasis, chronic bronchitis, allergic rhinitis Dr Scotty Court treated for pneumonia in May. Went to ER and was treated with Rocephin/ zith, extended as zithromax. He was able to go on to his vacation in [place] without problems. He goes about 3 weeks between" build-ups", at which time he needs antibiotic. He had taken 3 days of augnmentin just  before the pneumonia.   March 24, 2010 finished Avelox 8/12> sputum  less dark but yellow then doxy on 8/8  p dx with pna in ER,    Admit [facility]  dx WG by pos c anca 03/2010  Admit [facility] dx spont L Ptx 9/08/15/09 requiring Chest tube but no surgery  May 10, 2010 Post hospital followup.  Pt states "breathing is terrible"- gets out of breath walking from one room to the next.  He also c/o cough- sometimes prod with clear sputum. He c/o no appetite and "no taste".  He has been sleeping in a recliner.    Page 2  05/24/10--Presents for a follow up and med reivew. Since last visit. has been hospitalized 05/12/2010-- 10/10/2011for   Right pneumothorax., with underlying  Wegener granulomatosis. and . Bronchiectasis. Pt was found to have Right Pneumo. Chest tube was placed. with improvement and removed 10/7. Xray on 10/10 w/ resolved pnuemo. He was continued on cytoxan and bactrim for PCP prophylaxis.  He has brought all his meds today and we organized them in a med calendar.  He is still very weak. Breathing is slightly better. but still weak . >>same rx   May 27, 2010--Returns today to have suture removed from previous right chest tube for pnuemothorax. He has an office viist.  next week with Dr. [name] for chest xray. He conttinues to be improved with no increased dyspnea or chest pain. No redness or drainage at site. no fevers.   June 09, 2010 Dyspnea and cough- some improvement.  sev episodes of bilateral shoulder > hand pain waking from sleeep, better with ultram , never with ex, no assoc cp, diaph or nausea or sob.  no hemotysis or other joint co's.  Prednisone 20 mg reduce to one half daily  Aspirin 81 mg one daily with breakfast but stop for any bleeding See Patient Care Coordinator before leaving for cardiology appt > saw Dr [name],  ? LHC needed   July 21, 2010 ov sob better no longer needing any 02 at all during day, no hemoptysis ,  producing occ yellow mucus - now reports intermittent noct bilateral shoulder pain better with streching, moving, massage. no assoc nausea or sweats  . Pt denies any significant sore throat, dysphagia, itching, sneezing,  nasal congestion or excess secretions,  fever, chills, sweats, unintended wt loss, pleuritic or exertional cp, hempoptysis, change in activity tolerance  orthopnea pnd or leg swelling Pt also denies any obvious fluctuation in symptoms with weather or environmental change or other alleviating or aggravating factors.       Current Medications (verified): 1)  Symbicort 160-4.5 Mcg/act Aero (Budesonide-Formoterol Fumarate) .... 2 Puffs Two Times A Day 2)  Bisoprolol Fumarate 5 Mg Tabs (Bisoprolol Fumarate) .Marland KitchenMarland KitchenMarland Kitchen  1/2 Once Daily 3)  Flomax 0.4 Mg Xr24h-Cap (Tamsulosin Hcl) .Marland Kitchen.. 1 By Mouth Once Daily 4)  Pantoprazole Sodium 40 Mg Tbec (Pantoprazole Sodium) .... Take  One 30-60 Min Before First Meal of The Day 5)  Cyclophosphamide 25 Mg Tabs (Cyclophosphamide) .... 3 Once Daily 6)  Prednisone 20 Mg Tabs (Prednisone) .... One Half With Breakfast Daily 7)  Avodart 0.5 Mg Caps (Dutasteride) .Marland Kitchen.. 1 Every Other Day 8)  Mucinex Dm 30-600 Mg Xr12h-Tab (Dextromethorphan-Guaifenesin) .Marland Kitchen.. 1 Two Times A Day 9)   Sulfamethoxazole-Tmp Ds 800-160 Mg Tabs (Sulfamethoxazole-Trimethoprim) .Marland Kitchen.. 1 Every Mon Wed and Fri 10)  Dulcolax 5 Mg Tbec (Bisacodyl) .... As Per Bottle Directions As Needed 11)  *02  2lpm At Rest and If Short of Breath Waliking 3 Lpm 12)  Senokot S 8.6-50 Mg Tabs (Sennosides-Docusate Sodium) .... Take As Directed Per Box 13)  Tylenol 325 Mg Tabs (Acetaminophen) .... Take As Directed Per Bottle 14)  Tramadol Hcl 50 Mg Tabs (Tramadol Hcl) .... Take One To Tow Tabs Every 4 Hrs As Needed 15)  Maalox Max 1000-60 Mg Chew (Calcium Carbonate-Simethicone) .... Take As Directed Per Bottle As Needed 16)  Xopenex Hfa 45 Mcg/act Aero (Levalbuterol Tartrate) .... 2 Puffs Every 4 Hrs As Needed 17)  Hydromet 5-1.5 Mg/1ml Syrp (Hydrocodone-Homatropine) .Marland Kitchen.. 1 To 2 Tsp Every 4 Hrs As Needed For Cough 18)  Aspirin 81 Mg Tbec (Aspirin) .... Take One Tablet By Mouth Daily  Allergies (verified): No Known Drug Allergies  Vital Signs:  Patient profile:   75 year old male Weight:      116.38 pounds O2 Sat:      95 % on Room air Temp:     98.1 degrees F oral Pulse rate:   106 / minute BP sitting:   98 / 60  (left arm)  Vitals Entered By: Vernie Murders (July 21, 2010 11:19 AM)  O2 Flow:  Room air  Physical Exam  Additional Exam:  General: A/Ox3; depressed affect and mood Baseline wt =130 Wt  110 May 10, 2010> > 113 June 09, 2010 > 116 July 21, 2010  chronically ill wheelchair bound.  SKIN: no rash, lesions NODES: no lymphadenopathy HEENT: [place]/AT, EOM- WNL, Conjuctivae- clear, PERRLA, TM-WNL, Nose- clear, Throat- clear and wnl, Mallampati  III NECK: Supple w/ fair ROM, JVD- none, normal carotid impulses w/o bruits Thyroid-  CHEST: Mininimal exp rhonchi bilaterally HEART: RRR, no m/g/r heard ABDOMEN: Soft and nl; ZOX:WRUE, nl pulses, no edema, clubbing or cyanosis       Sodium                    141 mEq/L                   135-145   Potassium                 5.0 mEq/L                    3.5-5.1   Chloride                  103 mEq/L                   96-112   Carbon Dioxide            29 mEq/L                    19-32   Glucose  85 mg/dL                    16-10   BUN                  [H]  26 mg/dL                    9-60   Creatinine                1.2 mg/dL                   4.5-4.0   Calcium                   8.9 mg/dL                   9.8-11.9   GFR                       62.64 mL/min                >60.00  Tests: (2) CBC Platelet w/Diff (CBCD)   White Cell Count          9.1 K/uL                    4.5-10.5   Red Cell Count       [L]  3.00 Mil/uL                 4.22-5.81   Hemoglobin           [L]  9.4 g/dL                    14.7-82.9   Hematocrit           [L]  28.7 %                      39.0-52.0   MCV                       95.5 fl                     78.0-100.0   MCHC                      32.8 g/dL                   56.2-13.0   RDW                  [H]  22.2 %                      11.5-14.6   Platelet Count            339.0 K/uL                  150.0-400.0     Giant Platelets seen on smear.   Neutrophil %         [H]  87.0 %                      43.0-77.0     smear reviewed   Lymphocyte %         [L]  4.1 %  12.0-46.0   Monocyte %                7.9 %                       3.0-12.0   Eosinophils%              0.8 %                       0.0-5.0   Basophils %               0.2 %                       0.0-3.0   Neutrophill Absolute [H]  7.9 K/uL                    1.4-7.7   Lymphocyte Absolute  [L]  0.4 K/uL                    0.7-4.0   Monocyte Absolute         0.7 K/uL                    0.1-1.0  Eosinophils, Absolute                             0.1 K/uL                    0.0-0.7   Basophils Absolute        0.0 K/uL                    0.0-0.1  Tests: (3) Sed Rate (ESR)   Sed Rate             [H]  49 mm/hr                    0-22  CXR  Procedure date:  07/21/2010  Findings:      IMPRESSION: Chronic changes  without definite new findings since 06/01/2010  Impression & Recommendations:  Problem # 1:  WEGENER'S GRANULOMATOSIS (ICD-446.4) ESR trending down nicely so try Prednisone 5 mg per day.  The goal with a chronic steroid dependent illness is always arriving at the lowest effective dose that controls the disease/symptoms and not accepting a set "formula" which is based on statistics that don't take into accound individual variability or the natural hx of the dz in every individual patient, which may well vary over time. Hopefully the floor here will be 0 with CTX effects being tol well  Problem # 2:  ANEMIA (ICD-285.9)  hgb 9.0 > 9.4 July 21, 2010 so no change in rx needed  Orders: Est. Patient Level IV ([address])  Problem # 3:  BRONCHIECTASIS (ICD-494.0) doing well on symbicort   Each maintenance medication was reviewed in detail including most importantly the difference between maintenance and as needed and under what circumstances the prns are to be used. This was done in the context of a medication calendar review which provided the patient with a user-friendly unambiguous mechanism for medication administration and reconciliation and provides an action plan for all active problems. It is critical that this be shown to every doctor  for modification during the office visit if necessary so the patient can use it as a working document.  Problem # 4:  CHEST PAIN (ICD-786.50) Not c/w angina because better with position change and activity, initial w/u inconclusive and wants to postpone w/u for now which is reasonable from my perspective, f/u planned by Dr [name]  Medications Added to Medication List This Visit: 1)  Prednisone 5 Mg Tabs (Prednisone) .... One each am  Other Orders: T-2 View CXR (71020TC) TLB-BMP (Basic Metabolic Panel-BMET) (80048-METABOL) TLB-CBC Platelet - w/Differential (85025-CBCD) TLB-Sedimentation Rate (ESR) (85652-ESR)  Patient Instructions: 1)  Reduce  prednisone to 5 mg one daily 2)  Try tylenol before using tramadol for pain as per calendar 3)  Please schedule a follow-up appointment in 4  weeks, sooner if needed  Prescriptions: HYDROMET 5-1.5 MG/5ML SYRP (HYDROCODONE-HOMATROPINE) 1 to 2 tsp every 4 hrs as needed for cough  #8 oz x 0   Entered and Authorized by:   [name] MD   Signed by:   [name] MD on 07/21/2010   Method used:   Print then Give to Patient   RxID:   [id number] PREDNISONE 5 MG TABS (PREDNISONE) one each am  #100 x 0   Entered and Authorized by:   [name] MD   Signed by:   [name] MD on 07/21/2010   Method used:   Electronically to        [facility] [id number] (retail)       [address] [place], [place]  [address]       Ph: [phone]       Fax: [phone]   RxID:   [id number]

## 2010-09-20 ENCOUNTER — Telehealth: Payer: Self-pay | Admitting: Internal Medicine

## 2010-09-21 ENCOUNTER — Telehealth: Payer: Self-pay | Admitting: Gastroenterology

## 2010-09-22 ENCOUNTER — Encounter (INDEPENDENT_AMBULATORY_CARE_PROVIDER_SITE_OTHER): Payer: Self-pay | Admitting: *Deleted

## 2010-09-29 NOTE — Letter (Signed)
Summary: New Patient letter  [facility]  [address] [place], [place] [address]   Phone: [phone]  Fax: [phone]       09/22/2010 MRN: [id number]  Richard [name] [address] [place], [place]  [address]  Dear [name],  Welcome to the Gastroenterology Division at [facility].    You are scheduled to see Dr.  Russella Dar on 10/25/2010 at 2:30pm on the 3rd floor at [facility], [address].  We ask that you try to arrive at our office 15 minutes prior to your appointment time to allow for check-in.  We would like you to complete the enclosed self-administered evaluation form prior to your visit and bring it with you on the day of your appointment.  We will review it with you.  Also, please bring a complete list of all your medications or, if you prefer, bring the medication bottles and we will list them.  Please bring your insurance card so that we may make a copy of it.  If your insurance requires a referral to see a specialist, please bring your referral form from your primary care physician.  Co-payments are due at the time of your visit and may be paid by cash, check or credit card.     Your office visit will consist of a consult with your physician (includes a physical exam), any laboratory testing he/she may order, scheduling of any necessary diagnostic testing (e.g. x-ray, ultrasound, CT-scan), and scheduling of a procedure (e.g. Endoscopy, Colonoscopy) if required.  Please allow enough time on your schedule to allow for any/all of these possibilities.    If you cannot keep your appointment, please call [phone] to cancel or reschedule prior to your appointment date.  This allows [name] the opportunity to schedule an appointment for another patient in need of care.  If you do not cancel or reschedule by 5 p.m. the business day prior to your appointment date, you will be charged a $50.00 late cancellation/no-show fee.    Thank you for choosing  [facility] for your medical needs.  We appreciate the opportunity to care for you.  Please visit [name] at our website  to learn more about our practice.                     Sincerely,                                                             The Gastroenterology Division

## 2010-09-29 NOTE — Progress Notes (Signed)
Summary: abdominal pain> advised contact PCP  Phone Note Call from Patient Call back at Home Phone [phone]   Caller: Patient Call For: Richard [name] Summary of Call: pt c/o pain in lower abdomen x 3 wks. he has not had a colonoscopy in "about 10 yrs". bowel movement "a little more often than usual" no change in color. stools are normal- not constipated or runny. has nausea "sometimes but not often". no vomiting or fever.  Initial call taken by: [name], CNA,  September 20, 2010 2:14 PM  Follow-up for Phone Call        Spoke with pt and he is c/o stomach pain on and off x 3 weeks. i advised pt that MW is his pulmonary docotr so he should contact his PCP for this or GI doctor if he has one. Pt staets understanding. [name] CMA  September 20, 2010 5:07 PM

## 2010-09-29 NOTE — Progress Notes (Signed)
Summary: Switch Providers   Phone Note Call from Patient Call back at Home Phone [phone]   Caller: Patient Call For: Dr. Jarold Motto Reason for Call: Talk to Nurse Summary of Call: Pt. saw Dr. Jarold Motto in 1998 and requesting to switch to Dr. Russella Dar because his wife is a pt. of Dr. Russella Dar Initial call taken by: Karna Christmas,  September 21, 2010 11:18 AM  Follow-up for Phone Call        atterson Do you ok the change?? chart on your desk. Follow-up by: Harlow Mares CMA Duncan Dull),  September 21, 2010 11:27 AM  Additional Follow-up for Phone Call Additional follow up Details #1::        yes Additional Follow-up by: Mardella Layman MD Clementeen Graham,  September 21, 2010 2:10 PM     Appended Document: Switch Providers Dr. Russella Dar do you accept this pt? chart on your desk.  Appended Document: Switch Providers OK with me.  Appended Document: Switch Providers appt made with stark in march pts wife aware

## 2010-10-05 ENCOUNTER — Encounter: Payer: Self-pay | Admitting: Internal Medicine

## 2010-10-05 ENCOUNTER — Other Ambulatory Visit: Payer: Self-pay | Admitting: Internal Medicine

## 2010-10-05 ENCOUNTER — Other Ambulatory Visit: Payer: Medicare Other

## 2010-10-05 ENCOUNTER — Ambulatory Visit (INDEPENDENT_AMBULATORY_CARE_PROVIDER_SITE_OTHER): Payer: Medicare Other | Admitting: Internal Medicine

## 2010-10-05 ENCOUNTER — Ambulatory Visit (INDEPENDENT_AMBULATORY_CARE_PROVIDER_SITE_OTHER)
Admission: RE | Admit: 2010-10-05 | Discharge: 2010-10-05 | Disposition: A | Discharge status: Home / Self Care | Payer: Medicare Other | Source: Ambulatory Visit | Attending: Internal Medicine | Admitting: Internal Medicine

## 2010-10-05 DIAGNOSIS — M3130 Wegener's granulomatosis without renal involvement: Secondary | ICD-10-CM

## 2010-10-05 DIAGNOSIS — R05 Cough: Secondary | ICD-10-CM

## 2010-10-05 DIAGNOSIS — R059 Cough, unspecified: Secondary | ICD-10-CM

## 2010-10-05 DIAGNOSIS — R079 Chest pain, unspecified: Secondary | ICD-10-CM

## 2010-10-05 LAB — CBC WITH DIFFERENTIAL/PLATELET
Eosinophils Absolute: 0.1 10*3/uL (ref 0.0–0.7)
Lymphs Abs: 0.4 10*3/uL — ABNORMAL LOW (ref 0.7–4.0)
MCHC: 34.7 g/dL (ref 30.0–36.0)
MCV: 106.9 fl — ABNORMAL HIGH (ref 78.0–100.0)
Monocytes Absolute: 0.6 10*3/uL (ref 0.1–1.0)
Neutrophils Relative %: 82.4 % — ABNORMAL HIGH (ref 43.0–77.0)
Platelets: 249 10*3/uL (ref 150.0–400.0)
RDW: 15.1 % — ABNORMAL HIGH (ref 11.5–14.6)

## 2010-10-05 LAB — BASIC METABOLIC PANEL
BUN: 20 mg/dL (ref 6–23)
CO2: 25 mEq/L (ref 19–32)
Calcium: 8.8 mg/dL (ref 8.4–10.5)
GFR: 51.63 mL/min — ABNORMAL LOW (ref >60.00)
Glucose, Bld: 101 mg/dL — ABNORMAL HIGH (ref 70–99)
Sodium: 139 mEq/L (ref 135–145)

## 2010-10-05 LAB — SEDIMENTATION RATE: Sed Rate: 21 mm/hr (ref 0–22)

## 2010-10-06 ENCOUNTER — Telehealth: Payer: Self-pay | Admitting: Internal Medicine

## 2010-10-13 ENCOUNTER — Telehealth (INDEPENDENT_AMBULATORY_CARE_PROVIDER_SITE_OTHER): Payer: Self-pay | Admitting: *Deleted

## 2010-10-13 ENCOUNTER — Encounter: Payer: Self-pay | Admitting: Adult Health

## 2010-10-13 ENCOUNTER — Ambulatory Visit (INDEPENDENT_AMBULATORY_CARE_PROVIDER_SITE_OTHER): Payer: Medicare Other | Admitting: Adult Health

## 2010-10-13 DIAGNOSIS — J479 Bronchiectasis, uncomplicated: Secondary | ICD-10-CM

## 2010-10-14 NOTE — Assessment & Plan Note (Signed)
Summary: Pulmonary/ ext ov > try to wean off prednisone   Primary Provider/Referring Provider:  [name]  CC:  Cough- worse.  History of Present Illness: 50  yowm never smoker with dx of bronchiectasis dx with  WG 03/2010  July 08, 2009- Bronchiectasis, chronic bronchits, allergic rhinitis Had flu vax and has had at least 2 pneumovax.   January 20, 2010- Bronchiectasis, chronic bronchitis, allergic rhinitis Dr Scotty Court treated for pneumonia in May. Went to ER and was treated with Rocephin/ zith, extended as zithromax. He was able to go on to his vacation in [place] without problems. He goes about 3 weeks between" build-ups", at which time he needs antibiotic. He had taken 3 days of augnmentin just  before the pneumonia.   March 24, 2010 finished Avelox 8/12> sputum  less dark but yellow then doxy on 8/8  p dx with pna in ER,    Admit [facility]  dx WG by pos c anca 03/2010, supportive tbbx but not vats  Admit [name] dx spont L Ptx 9/08/15/09 requiring Chest tube but no surgery     Page 2  05/24/10--Presents for a follow up and med reivew. Since last visit. has been hospitalized 05/12/2010-- 10/10/2011for   Right pneumothorax., with underlying  Wegener granulomatosis. and . Bronchiectasis. Pt was found to have Right Pneumo. Chest tube was placed. with improvement and removed 10/7. Xray on 10/10 w/ resolved pnuemo. He was continued on cytoxan and bactrim for PCP prophylaxis.  He has brought all his meds today and we organized them in a med calendar.  He is still very weak. Breathing is slightly better. but still weak . >>same rx   June 09, 2010 Dyspnea and cough- some improvement.  sev episodes of bilateral shoulder > hand pain waking from sleeep, better with ultram , never with ex, no assoc cp, diaph or nausea or sob.  no hemotysis or other joint co's.  Prednisone 20 mg reduce to one half daily  Aspirin 81 mg one daily with breakfast but stop for any bleeding See Patient Care Coordinator  before leaving for cardiology appt > saw Dr [name],  ? LHC needed   July 21, 2010 ov sob better no longer needing any 02 at all during day, no hemoptysis ,  producing occ yellow mucus - now reports intermittent noct bilateral shoulder pain better with streching, moving, massage. no assoc nausea or sweats.  recd try pred 5 mg daily  August 24, 2010 ov cough w/ yellow phlem, breathing is better, using flutter and med calendar consistently.  R chest pain with deep breathing no change since R chest tube removed in 05/2010.  October 05, 2010 ov cough / congestion slt yellowish sputum. no sob over baseline. Pt denies any significant sore throat, dysphagia, itching, sneezing,  nasal congestion or excess secretions,  fever, chills, sweats, unintended wt loss, pleuritic or exertional cp, hempoptysis, change in activity tolerance  orthopnea pnd or leg swelling.  Pt also denies any obvious fluctuation in symptoms with weather or environmental change or other alleviating or aggravating factors.          Current Medications (verified): 1)  Symbicort 160-4.5 Mcg/act Aero (Budesonide-Formoterol Fumarate) .... 2 Puffs Two Times A Day 2)  Bisoprolol Fumarate 5 Mg Tabs (Bisoprolol Fumarate) .... 1/2 Once Daily 3)  Flomax 0.4 Mg Xr24h-Cap (Tamsulosin Hcl) .Marland Kitchen.. 1 By Mouth Once Daily 4)  Nexium 40 Mg Cpdr (Esomeprazole Magnesium) .... Once Daily 5)  Cyclophosphamide 25 Mg Tabs (Cyclophosphamide) .... 3  Once Daily 6)  Avodart 0.5 Mg Caps (Dutasteride) .Marland Kitchen.. 1 Every Other Day 7)  Mucinex Dm 30-600 Mg Xr12h-Tab (Dextromethorphan-Guaifenesin) .Marland Kitchen.. 1 Two Times A Day 8)  Sulfamethoxazole-Tmp Ds 800-160 Mg Tabs (Sulfamethoxazole-Trimethoprim) .Marland Kitchen.. 1 Every Mon Wed and Fri 9)  Dulcolax 5 Mg Tbec (Bisacodyl) .... As Per Bottle Directions As Needed 10)  *02  2lpm At Rest and If Short of Breath [place] 3 Lpm 11)  Senokot S 8.6-50 Mg Tabs (Sennosides-Docusate Sodium) .... Take As Directed Per Box 12)  Tylenol 325 Mg Tabs  (Acetaminophen) .... Take As Directed Per Bottle 13)  Tramadol Hcl 50 Mg Tabs (Tramadol Hcl) .... Take One To Tow Tabs Every 4 Hrs As Needed 14)  Xopenex Hfa 45 Mcg/act Aero (Levalbuterol Tartrate) .... 2 Puffs Every 4 Hrs As Needed 15)  Hydromet 5-1.5 Mg/54ml Syrp (Hydrocodone-Homatropine) .Marland Kitchen.. 1 To 2 Tsp Every 4 Hrs As Needed For Cough 16)  Aspirin 81 Mg Tbec (Aspirin) .... Take One Tablet By Mouth Daily 17)  Prednisone 5 Mg Tabs (Prednisone) .... 1/2 Once Daily 18)  Cyclophosphamide 25 Mg Tabs (Cyclophosphamide) .... 3 Once Daily  Allergies (verified): No Known Drug Allergies  Past History:  Past Medical History: Allergic rhinitis GERD- hiatal hernia with reflux     - Repeat Barium Swallow c/w stricture and reflux 03/29/10 Bronchiectasis     - See CT chest 12/14/2009 > progressive bilateral R> L cavities 03/26/10 > clinical dx Wegener's 04/07/10      - PFTs 02/17/10  FEV1 1.52 (58%) ratio 42 and 15% better after B2, DLC0 118% WEGENER'S GRANULOMATOSIS      - dX BY pos C anca/ supportive TBBX 04/10/10 > CYTOXAN initiated      - Prednisone tapered off March 2012  PAF/ abn echo during [facility] admit 03/30/10       - ECH0 Aug 23 2011LVH with  low nl ef, mild mr, ok R function, peak trop .59       - Not anticoagulated due to  WG with Hemoptysis L PTX  04/25/10 R PTX 05/12/10  >   final f/u by [name] 06/01/10 benign hypertrophy of the prostate Complex med regimen--Meds reviewed with pt education and computerized med calendar May 25, 2010   Vital Signs:  Patient profile:   75 year old male Weight:      125 pounds O2 Sat:      95 % on Room air Temp:     97.9 degrees F oral Pulse rate:   80 / minute BP sitting:   102 / 60  (left arm)  Vitals Entered By: Vernie Murders (October 05, 2010 11:13 AM)  O2 Flow:  Room air  Physical Exam  Additional Exam:  General: A/Ox3; depressed affect and mood Baseline wt =130 Wt  110 May 10, 2010 > 120 August 24, 2010 > 125 October 05, 2010    chronically amb, up on table without assistance.  SKIN: no rash, lesions NODES: no lymphadenopathy HEENT: [place]/AT, EOM- WNL, Conjuctivae- clear, PERRLA, TM-WNL, Nose- clear, Throat- clear and wnl, Mallampati  III NECK: Supple w/ fair ROM, JVD- none, normal carotid impulses w/o bruits Thyroid-  CHEST: Mininimal exp rhonchi bilaterally HEART: RRR, no m/g/r heard ABDOMEN: Soft and nl; OZH:YQMV, nl pulses, no edema, clubbing or cyanosis       Sodium                    139 mEq/L  135-145   Potassium                 4.8 mEq/L                   3.5-5.1   Chloride                  104 mEq/L                   96-112   Carbon Dioxide            25 mEq/L                    19-32   Glucose              [H]  101 mg/dL                   60-45   BUN                       20 mg/dL                    4-09   Creatinine                1.4 mg/dL                   8.1-1.9   Calcium                   8.8 mg/dL                   1.4-78.2   GFR                  [L]  51.63 mL/min                >60.00  Tests: (2) CBC Platelet w/Diff (CBCD)   White Cell Count          5.9 K/uL                    4.5-10.5     Manual smear review agrees with instrument differential.   Red Cell Count       [L]  2.95 Mil/uL                 4.22-5.81   Hemoglobin           [L]  10.9 g/dL                   95.6-21.3   Hematocrit           [L]  31.5 %                      39.0-52.0   MCV                  [H]  106.9 fl                    78.0-100.0   MCHC                      34.7 g/dL                   08.6-57.8   RDW                  [H]  15.1 %  11.5-14.6   Platelet Count            249.0 K/uL                  150.0-400.0   Neutrophil %         [H]  82.4 %                      43.0-77.0   Lymphocyte %         [L]                              12.0-46.0       RESULT: 6.1 Repeated and verified X2. %   Monocyte %                9.6 %                       3.0-12.0   Eosinophils%              1.7  %                       0.0-5.0   Basophils %               0.2 %                       0.0-3.0   Neutrophill Absolute      4.8 K/uL                    1.4-7.7   Lymphocyte Absolute  [L]  0.4 K/uL                    0.7-4.0   Monocyte Absolute         0.6 K/uL                    0.1-1.0  Eosinophils, Absolute                             0.1 K/uL                    0.0-0.7   Basophils Absolute        0.0 K/uL                    0.0-0.1  Tests: (3) Sed Rate (ESR)   Sed Rate                  21 mm/hr                    0-22  CXR  Procedure date:  10/05/2010  Findings:      Comparison: 08/24/2010   Findings: COPD with hyperinflation.  There is pulmonary scarring, worse on the right than the left.  This is stable.  No superimposed pneumonia or edema.  Hiatal hernia is noted.   IMPRESSION: COPD and scarring.  No significant change from prior study.  Impression & Recommendations:  Problem # 1:  WEGENER'S GRANULOMATOSIS (ICD-446.4) No evidence of active dz on ctx and near physiologic levels of prednisone and tolerating ctx well with ESR 21 > 25 with taper prednsione so try to taper off at this point  Discussed in detail all  the  indications, usual  risks and alternatives  relative to the benefits with patient who agrees to proceed with taping off prednisone compleltely at this point.  This was done in the context of a medication calendar review which provided the patient with a user-friendly unambiguous mechanism for medication administration and reconciliation and provides an action plan for all active problems. It is critical that this be shown to every doctor  for modification during the office visit if necessary so the patient can use it as a working document. See instructions for specific recommendations   Problem # 2:  BRONCHIECTASIS (ICD-494.0) no active sympoms @ present  Medications Added to Medication List This Visit: 1)  Mucinex Dm 30-600 Mg Xr12h-tab  (Dextromethorphan-guaifenesin) .... 2 every 12 hours 2)  Tramadol Hcl 50 Mg Tabs (Tramadol hcl) .... Take one to tow tabs every 4 hrs as needed 3)  Prednisone 5 Mg Tabs (Prednisone) .... 1/2 once daily 4)  Prednisone 5 Mg Tabs (Prednisone) .... 1/2 odd days x 2 weeks then stop  Other Orders: T-2 View CXR (71020TC) Prescription Created Electronically [phone]) Est. Patient Level IV (11914) TLB-BMP (Basic Metabolic Panel-BMET) (80048-METABOL) TLB-CBC Platelet - w/Differential (85025-CBCD) TLB-Sedimentation Rate (ESR) (85652-ESR)  Patient Instructions: 1)  See calendar for specific medication instructions and bring it back for each and every office visit for every healthcare provider you see.  Without it,  you may not receive the best quality medical care that we feel you deserve.  2)  Zostrix cream twice daily 3)  Mucinex dm is 2 every 12 hours as on the calendar 4)  Tramdadol can be use for pain or cough 5)  Pred to 5 mg one half  odd days x 2 weeks and then try stopping if feel good - if feel tired, nauseated or worse cough, short breath please call 6)  Please schedule a follow-up appointment in 6 weeks, sooner if needed  Prescriptions: TRAMADOL HCL 50 MG TABS (TRAMADOL HCL) Take one to tow tabs every 4 hrs as needed  #40 x 0   Entered and Authorized by:   [name] MD   Signed by:   [name] MD on 10/05/2010   Method used:   Electronically to        [facility] [id number]* (retail)       [address] [place], [place]  [address]       Ph: [phone]       Fax: [phone]   RxID:   [id number]

## 2010-10-14 NOTE — Progress Notes (Signed)
Summary: returned call  Phone Note Call from Patient Call back at Home Phone [phone] [facility]     Caller: Patient Call For: Tiaria Biby Summary of Call: pt returned call from nurse.  Initial call taken by: [name], CNA,  October 06, 2010 11:44 AM  Follow-up for Phone Call        spoke with pt and advised of CXR results and labs results all normal. [name] CMA  October 06, 2010 12:28 PM

## 2010-10-19 NOTE — Progress Notes (Signed)
Summary: coughing up yellowish mucas, throat and glands are sore  Phone Note Call from Patient Call back at Home Phone [phone]   Caller: Patient Call For: wert Summary of Call: Patient phoned statet that he saw Dr [name] on 2/28 and Dr [name] advised him to call if he had any changes and over the week end his throat started getting sore and the glands in his throat is sore he is now coughing yellowish mucas and his throat hurts when he coughs. He stated that he really doesnt fell good at all and wanted to know what he should do. His fever was a little over 99 last night. He uses Therapist, occupational on [address] at [place]. He can be reached at [phone] Initial call taken by: Vedia Coffer,  October 13, 2010 9:00 AM  Follow-up for Phone Call        Appt Scheduled Today Follow-up by: Vernie Murders,  October 13, 2010 9:27 AM

## 2010-10-19 NOTE — Assessment & Plan Note (Signed)
Summary: Acute NP office visit - COPD   Primary Provider/Referring Provider:  [name]  CC:  prod cough with yellow mucus, sore throat, rattling in chest, and low grade temp x3days.  History of Present Illness: 75  yowm never smoker with dx of bronchiectasis dx with  WG 03/2010  July 08, 2009- Bronchiectasis, chronic bronchits, allergic rhinitis Had flu vax and has had at least 2 pneumovax.   January 20, 2010- Bronchiectasis, chronic bronchitis, allergic rhinitis Dr Scotty Court treated for pneumonia in May. Went to ER and was treated with Rocephin/ zith, extended as zithromax. He was able to go on to his vacation in [place] without problems. He goes about 3 weeks between" build-ups", at which time he needs antibiotic. He had taken 3 days of augnmentin just  before the pneumonia.   March 24, 2010 finished Avelox 8/12> sputum  less dark but yellow then doxy on 8/8  p dx with pna in ER,    Admit [facility]  dx WG by pos c anca 03/2010, supportive tbbx but not vats  Admit [name] dx spont L Ptx 9/08/15/09 requiring Chest tube but no surgery     Page 2  05/24/10--Presents for a follow up and med reivew. Since last visit. has been hospitalized 05/12/2010-- 10/10/2011for   Right pneumothorax., with underlying  Wegener granulomatosis. and . Bronchiectasis. Pt was found to have Right Pneumo. Chest tube was placed. with improvement and removed 10/7. Xray on 10/10 w/ resolved pnuemo. He was continued on cytoxan and bactrim for PCP prophylaxis.  He has brought all his meds today and we organized them in a med calendar.  He is still very weak. Breathing is slightly better. but still weak . >>same rx   June 09, 2010 Dyspnea and cough- some improvement.  sev episodes of bilateral shoulder > hand pain waking from sleeep, better with ultram , never with ex, no assoc cp, diaph or nausea or sob.  no hemotysis or other joint co's.  Prednisone 20 mg reduce to one half daily  Aspirin 81 mg one daily with breakfast but  stop for any bleeding See Patient Care Coordinator before leaving for cardiology appt > saw Dr [name],  ? LHC needed   July 21, 2010 ov sob better no longer needing any 02 at all during day, no hemoptysis ,  producing occ yellow mucus - now reports intermittent noct bilateral shoulder pain better with streching, moving, massage. no assoc nausea or sweats.  recd try pred 5 mg daily  August 24, 2010 ov cough w/ yellow phlem, breathing is better, using flutter and med calendar consistently.  R chest pain with deep breathing no change since R chest tube removed in 05/2010.  October 05, 2010 ov cough / congestion slt yellowish sputum. no sob over baseline.   October 13, 2010 --Presents for an acute office visit. Complains of productive cough with yellow mucus, sore throat, rattling in chest, low grade temp x3days. Currently on pred 1/2 every other day .  OTC not helping. Denies chest pain,  orthopnea, hemoptysis, fever, n/v/d, edema, headache. Eating but appetite is low. Feels cough is getting worse esp in am and at bedtime .     Medications Prior to Update: 1)  Symbicort 160-4.5 Mcg/act Aero (Budesonide-Formoterol Fumarate) .... 2 Puffs Two Times A Day 2)  Bisoprolol Fumarate 5 Mg Tabs (Bisoprolol Fumarate) .... 1/2 Once Daily 3)  Flomax 0.4 Mg Xr24h-Cap (Tamsulosin Hcl) .Marland Kitchen.. 1 By Mouth Once Daily 4)  Nexium 40  Mg Cpdr (Esomeprazole Magnesium) .... Once Daily 5)  Cyclophosphamide 25 Mg Tabs (Cyclophosphamide) .... 3 Once Daily 6)  Cyclophosphamide 25 Mg Tabs (Cyclophosphamide) .... 3 Once Daily 7)  Prednisone 5 Mg Tabs (Prednisone) .... 1/2 Odd Days X 2 Weeks Then Stop 8)  Avodart 0.5 Mg Caps (Dutasteride) .Marland Kitchen.. 1 Every Other Day 9)  Mucinex Dm 30-600 Mg Xr12h-Tab (Dextromethorphan-Guaifenesin) .... 2 Every 12 Hours 10)  Sulfamethoxazole-Tmp Ds 800-160 Mg Tabs (Sulfamethoxazole-Trimethoprim) .Marland Kitchen.. 1 Every Mon Wed and Fri 11)  Dulcolax 5 Mg Tbec (Bisacodyl) .... As Per Bottle Directions As  Needed 12)  *02  2lpm At Rest and If Short of Breath Waliking 3 Lpm 13)  Aspirin 81 Mg Tbec (Aspirin) .... Take One Tablet By Mouth Daily 14)  Senokot S 8.6-50 Mg Tabs (Sennosides-Docusate Sodium) .... Take As Directed Per Box 15)  Tylenol 325 Mg Tabs (Acetaminophen) .... Take As Directed Per Bottle 16)  Tramadol Hcl 50 Mg Tabs (Tramadol Hcl) .... Take One To Tow Tabs Every 4 Hrs As Needed 17)  Xopenex Hfa 45 Mcg/act Aero (Levalbuterol Tartrate) .... 2 Puffs Every 4 Hrs As Needed 18)  Hydromet 5-1.5 Mg/4ml Syrp (Hydrocodone-Homatropine) .Marland Kitchen.. 1 To 2 Tsp Every 4 Hrs As Needed For Cough  Current Medications (verified): 1)  Symbicort 160-4.5 Mcg/act Aero (Budesonide-Formoterol Fumarate) .... 2 Puffs Two Times A Day 2)  Bisoprolol Fumarate 5 Mg Tabs (Bisoprolol Fumarate) .... 1/2 Once Daily 3)  Flomax 0.4 Mg Xr24h-Cap (Tamsulosin Hcl) .Marland Kitchen.. 1 By Mouth Once Daily 4)  Nexium 40 Mg Cpdr (Esomeprazole Magnesium) .... Once Daily 5)  Cyclophosphamide 25 Mg Tabs (Cyclophosphamide) .... 3 Once Daily 6)  Prednisone 5 Mg Tabs (Prednisone) .... 1/2 Odd Days X 2 Weeks Then Stop 7)  Avodart 0.5 Mg Caps (Dutasteride) .Marland Kitchen.. 1 Every Other Day 8)  Mucinex Dm 30-600 Mg Xr12h-Tab (Dextromethorphan-Guaifenesin) .... 2 Every 12 Hours 9)  Sulfamethoxazole-Tmp Ds 800-160 Mg Tabs (Sulfamethoxazole-Trimethoprim) .Marland Kitchen.. 1 Every Mon Wed and Fri 10)  Dulcolax 5 Mg Tbec (Bisacodyl) .... As Per Bottle Directions As Needed 11)  *02  2lpm At Rest and If Short of Breath Waliking 3 Lpm 12)  Aspirin 81 Mg Tbec (Aspirin) .... Take One Tablet By Mouth Daily 13)  Zostrix 0.025 % Crea (Capsaicin) .... Apply Two Times A Day 14)  Senokot S 8.6-50 Mg Tabs (Sennosides-Docusate Sodium) .... Take As Directed Per Box 15)  Tylenol 325 Mg Tabs (Acetaminophen) .... Take As Directed Per Bottle 16)  Tramadol Hcl 50 Mg Tabs (Tramadol Hcl) .... Take One To Tow Tabs Every 4 Hrs As Needed 17)  Maalox 600 Mg Chew (Calcium Carbonate Antacid) .... Per  Bottle 18)  Xopenex Hfa 45 Mcg/act Aero (Levalbuterol Tartrate) .... 2 Puffs Every 4 Hrs As Needed 19)  Hydromet 5-1.5 Mg/90ml Syrp (Hydrocodone-Homatropine) .Marland Kitchen.. 1 To 2 Tsp Every 4 Hrs As Needed For Cough  Allergies (verified): No Known Drug Allergies  Past History:  Past Medical History: Last updated: 10/05/2010 Allergic rhinitis GERD- hiatal hernia with reflux     - Repeat Barium Swallow c/w stricture and reflux 03/29/10 Bronchiectasis     - See CT chest 12/14/2009 > progressive bilateral R> L cavities 03/26/10 > clinical dx Wegener's 04/07/10      - PFTs 02/17/10  FEV1 1.52 (58%) ratio 42 and 15% better after B2, DLC0 118% WEGENER'S GRANULOMATOSIS      - dX BY pos C anca/ supportive TBBX 04/10/10 > CYTOXAN initiated      - Prednisone tapered off March 2012  PAF/ abn echo during [facility] 03/30/10       - ECH0 Aug 23 2011LVH with  low nl ef, mild mr, ok R function, peak trop .59       - Not anticoagulated due to  WG with Hemoptysis L PTX  04/25/10 R PTX 05/12/10  >   final f/u by [name] 06/01/10 benign hypertrophy of the prostate Complex med regimen--Meds reviewed with pt education and computerized med calendar May 25, 2010   Family History: Last updated: 06-28-2010 Mother- deceased age 54;car wreck Father- deceased age 38; breathing problems;asthma Brother- living age 9; heart  CAD (s/p CABG) Sibling- deceased age 40; heart (s/p CABG)  Social History: Last updated: 02/07/2008 Retired Non smoker Married with 3 children  Risk Factors: Smoking Status: never (05/27/2010)  Review of Systems      See HPI  Vital Signs:  Patient profile:   75 year old male Height:      67 inches Weight:      122.13 pounds BMI:     19.20 O2 Sat:      97 % on Room air Temp:     100.8 degrees F oral Pulse rate:   74 / minute BP sitting:   98 / 58  (left arm) Cuff size:   regular  Vitals Entered By: Boone Master CNA/MA (October 13, 2010 10:35 AM)  O2 Flow:  Room air CC: prod cough with  yellow mucus, sore throat, rattling in chest, low grade temp x3days Is Patient Diabetic? No Comments Medications reviewed with patient Daytime contact number verified with patient. Boone Master CNA/MA  October 13, 2010 10:35 AM    Physical Exam  Additional Exam:  General: A/Ox3; depressed affect and mood Baseline wt =130 Wt  110 May 10, 2010 > 120 August 24, 2010 > 125 October 05, 2010 >122 10/13/10  chronically amb, up on table without assistance.  SKIN: no rash, lesions NODES: no lymphadenopathy HEENT: [place]/AT, EOM- WNL, Conjuctivae- clear, PERRLA, TM-WNL, Nose- clear, Throat- clear and wnl, Mallampati  III NECK: Supple w/ fair ROM, JVD- none, normal carotid impulses w/o bruits Thyroid-  CHEST: Mininimal exp rhonchi bilaterally HEART: RRR, no m/g/r heard ABDOMEN: Soft and nl; WJX:BJYN, nl pulses, no edema, clubbing or cyanosis       Impression & Recommendations:  Problem # 1:  BRONCHIECTASIS (ICD-494.0)  Flare w/ bronchitis  Plan:  Augmentin 875mg  two times a day for 10 days Continue on Mucinex DM bid Increase Prednisone 5mg  2 by mouth x 1 week, 1 by mouth once daily x 1 week , then back to 1/2 every other day  follow up Dr. [name] in 4 weeks as planned and as needed  Hydromet as needed cough, may make you sleepy Please contact office for sooner follow up if symptoms do not improve or worsen   Orders: Est. Patient Level IV (82956)  Medications Added to Medication List This Visit: 1)  Zostrix 0.025 % Crea (Capsaicin) .... Apply two times a day 2)  Maalox 600 Mg Chew (Calcium carbonate antacid) .... Per bottle 3)  Augmentin 875-125 Mg Tabs (Amoxicillin-pot clavulanate) .Marland Kitchen.. 1 by mouth two times a day 4)  Hydromet 5-1.5 Mg/66ml Syrp (Hydrocodone-homatropine) .... 1/2 1 tsp every 4-6 hrs as needed cough  Patient Instructions: 1)  Augmentin 875mg  two times a day for 10 days 2)  Continue on Mucinex DM bid 3)  Increase Prednisone 5mg  2 by mouth x 1 week, 1 by mouth once  daily x  1 week , then back to 1/2 every other day  4)  follow up Dr. [name] in 4 weeks as planned and as needed  5)  Hydromet as needed cough, may make you sleepy 6)  Please contact office for sooner follow up if symptoms do not improve or worsen  Prescriptions: HYDROMET 5-1.5 MG/5ML SYRP (HYDROCODONE-HOMATROPINE) 1/2 1 tsp every 4-6 hrs as needed cough  #8 oz x 0   Entered and Authorized by:   Rubye Oaks NP   Signed by:   Toini Failla NP on 10/13/2010   Method used:   Print then Give to Patient   RxID:   [id number] AUGMENTIN 875-125 MG TABS (AMOXICILLIN-POT CLAVULANATE) 1 by mouth two times a day  #20 x 0   Entered and Authorized by:   Rubye Oaks NP   Signed by:   Tasheka Houseman NP on 10/13/2010   Method used:   Electronically to        [id number] (retail)       [address] [place], [place]  [address]       Ph: [phone]       Fax: [phone]   RxID:   [id number]

## 2010-10-21 ENCOUNTER — Encounter: Payer: Self-pay | Admitting: Family Medicine

## 2010-10-21 LAB — GLUCOSE, CAPILLARY
Glucose-Capillary: 117 mg/dL — ABNORMAL HIGH (ref 70–99)
Glucose-Capillary: 128 mg/dL — ABNORMAL HIGH (ref 70–99)
Glucose-Capillary: 138 mg/dL — ABNORMAL HIGH (ref 70–99)
Glucose-Capillary: 141 mg/dL — ABNORMAL HIGH (ref 70–99)
Glucose-Capillary: 163 mg/dL — ABNORMAL HIGH (ref 70–99)
Glucose-Capillary: 166 mg/dL — ABNORMAL HIGH (ref 70–99)
Glucose-Capillary: 214 mg/dL — ABNORMAL HIGH (ref 70–99)
Glucose-Capillary: 101 mg/dL — ABNORMAL HIGH (ref 70–99)
Glucose-Capillary: 101 mg/dL — ABNORMAL HIGH (ref 70–99)
Glucose-Capillary: 129 mg/dL — ABNORMAL HIGH (ref 70–99)
Glucose-Capillary: 140 mg/dL — ABNORMAL HIGH (ref 70–99)
Glucose-Capillary: 153 mg/dL — ABNORMAL HIGH (ref 70–99)
Glucose-Capillary: 165 mg/dL — ABNORMAL HIGH (ref 70–99)
Glucose-Capillary: 172 mg/dL — ABNORMAL HIGH (ref 70–99)
Glucose-Capillary: 180 mg/dL — ABNORMAL HIGH (ref 70–99)
Glucose-Capillary: 209 mg/dL — ABNORMAL HIGH (ref 70–99)
Glucose-Capillary: 239 mg/dL — ABNORMAL HIGH (ref 70–99)
Glucose-Capillary: 242 mg/dL — ABNORMAL HIGH (ref 70–99)
Glucose-Capillary: 253 mg/dL — ABNORMAL HIGH (ref 70–99)
Glucose-Capillary: 271 mg/dL — ABNORMAL HIGH (ref 70–99)
Glucose-Capillary: 273 mg/dL — ABNORMAL HIGH (ref 70–99)
Glucose-Capillary: 305 mg/dL — ABNORMAL HIGH (ref 70–99)
Glucose-Capillary: 61 mg/dL — ABNORMAL LOW (ref 70–99)
Glucose-Capillary: 62 mg/dL — ABNORMAL LOW (ref 70–99)
Glucose-Capillary: 65 mg/dL — ABNORMAL LOW (ref 70–99)
Glucose-Capillary: 69 mg/dL — ABNORMAL LOW (ref 70–99)
Glucose-Capillary: 91 mg/dL (ref 70–99)

## 2010-10-21 LAB — CBC
HCT: 25.5 % — ABNORMAL LOW (ref 39.0–52.0)
HCT: 24.4 % — ABNORMAL LOW (ref 39.0–52.0)
HCT: 24.6 % — ABNORMAL LOW (ref 39.0–52.0)
HCT: 30.7 % — ABNORMAL LOW (ref 39.0–52.0)
HCT: 23.4 % — ABNORMAL LOW (ref 39.0–52.0)
HCT: 24.3 % — ABNORMAL LOW (ref 39.0–52.0)
HCT: 24.3 % — ABNORMAL LOW (ref 39.0–52.0)
HCT: 25 % — ABNORMAL LOW (ref 39.0–52.0)
HCT: 25.6 % — ABNORMAL LOW (ref 39.0–52.0)
HCT: 25.7 % — ABNORMAL LOW (ref 39.0–52.0)
Hemoglobin: 8 g/dL — ABNORMAL LOW (ref 13.0–17.0)
Hemoglobin: 8.3 g/dL — ABNORMAL LOW (ref 13.0–17.0)
Hemoglobin: 8.6 g/dL — ABNORMAL LOW (ref 13.0–17.0)
Hemoglobin: 7.8 g/dL — ABNORMAL LOW (ref 13.0–17.0)
Hemoglobin: 7.8 g/dL — ABNORMAL LOW (ref 13.0–17.0)
Hemoglobin: 8 g/dL — ABNORMAL LOW (ref 13.0–17.0)
Hemoglobin: 8.4 g/dL — ABNORMAL LOW (ref 13.0–17.0)
Hemoglobin: 8.5 g/dL — ABNORMAL LOW (ref 13.0–17.0)
MCH: 29.4 pg (ref 26.0–34.0)
MCH: 28.1 pg (ref 26.0–34.0)
MCH: 28.2 pg (ref 26.0–34.0)
MCH: 28.5 pg (ref 26.0–34.0)
MCH: 29.3 pg (ref 26.0–34.0)
MCH: 28.4 pg (ref 26.0–34.0)
MCH: 28.6 pg (ref 26.0–34.0)
MCH: 28.8 pg (ref 26.0–34.0)
MCH: 28.8 pg (ref 26.0–34.0)
MCH: 28.9 pg (ref 26.0–34.0)
MCH: 29.1 pg (ref 26.0–34.0)
MCH: 29.8 pg (ref 26.0–34.0)
MCH: 31 pg (ref 26.0–34.0)
MCHC: 31.8 g/dL (ref 30.0–36.0)
MCHC: 31.9 g/dL (ref 30.0–36.0)
MCHC: 32.2 g/dL (ref 30.0–36.0)
MCHC: 32.9 g/dL (ref 30.0–36.0)
MCHC: 33.1 g/dL (ref 30.0–36.0)
MCHC: 33.5 g/dL (ref 30.0–36.0)
MCHC: 34 g/dL (ref 30.0–36.0)
MCHC: 34.3 g/dL (ref 30.0–36.0)
MCHC: 34.9 g/dL (ref 30.0–36.0)
MCV: 91.4 fL (ref 78.0–100.0)
MCV: 88.3 fL (ref 78.0–100.0)
MCV: 89.4 fL (ref 78.0–100.0)
MCV: 90.6 fL (ref 78.0–100.0)
MCV: 90.8 fL (ref 78.0–100.0)
MCV: 84.8 fL (ref 78.0–100.0)
MCV: 85.3 fL (ref 78.0–100.0)
MCV: 86.5 fL (ref 78.0–100.0)
MCV: 86.8 fL (ref 78.0–100.0)
MCV: 88.9 fL (ref 78.0–100.0)
Platelets: 392 10*3/uL (ref 150–400)
Platelets: 258 10*3/uL (ref 150–400)
Platelets: 266 10*3/uL (ref 150–400)
Platelets: 271 10*3/uL (ref 150–400)
Platelets: 259 10*3/uL (ref 150–400)
Platelets: 299 10*3/uL (ref 150–400)
Platelets: 574 10*3/uL — ABNORMAL HIGH (ref 150–400)
Platelets: 585 10*3/uL — ABNORMAL HIGH (ref 150–400)
Platelets: 604 10*3/uL — ABNORMAL HIGH (ref 150–400)
Platelets: 655 10*3/uL — ABNORMAL HIGH (ref 150–400)
RBC: 2.79 MIL/uL — ABNORMAL LOW (ref 4.22–5.81)
RBC: 2.73 MIL/uL — ABNORMAL LOW (ref 4.22–5.81)
RBC: 2.74 MIL/uL — ABNORMAL LOW (ref 4.22–5.81)
RBC: 2.81 MIL/uL — ABNORMAL LOW (ref 4.22–5.81)
RBC: 2.94 MIL/uL — ABNORMAL LOW (ref 4.22–5.81)
RBC: 3.39 MIL/uL — ABNORMAL LOW (ref 4.22–5.81)
RBC: 2.68 MIL/uL — ABNORMAL LOW (ref 4.22–5.81)
RBC: 2.68 MIL/uL — ABNORMAL LOW (ref 4.22–5.81)
RBC: 2.92 MIL/uL — ABNORMAL LOW (ref 4.22–5.81)
RDW: 19 % — ABNORMAL HIGH (ref 11.5–15.5)
RDW: 19.3 % — ABNORMAL HIGH (ref 11.5–15.5)
RDW: 18.9 % — ABNORMAL HIGH (ref 11.5–15.5)
RDW: 18.9 % — ABNORMAL HIGH (ref 11.5–15.5)
RDW: 19.1 % — ABNORMAL HIGH (ref 11.5–15.5)
RDW: 14.6 % (ref 11.5–15.5)
RDW: 14.9 % (ref 11.5–15.5)
RDW: 15.2 % (ref 11.5–15.5)
RDW: 15.4 % (ref 11.5–15.5)
RDW: 15.4 % (ref 11.5–15.5)
RDW: 15.5 % (ref 11.5–15.5)
RDW: 15.6 % — ABNORMAL HIGH (ref 11.5–15.5)
WBC: 6.8 10*3/uL (ref 4.0–10.5)
WBC: 7.3 10*3/uL (ref 4.0–10.5)
WBC: 13.1 10*3/uL — ABNORMAL HIGH (ref 4.0–10.5)
WBC: 5.7 10*3/uL (ref 4.0–10.5)
WBC: 12.8 10*3/uL — ABNORMAL HIGH (ref 4.0–10.5)
WBC: 13.2 10*3/uL — ABNORMAL HIGH (ref 4.0–10.5)
WBC: 14.8 10*3/uL — ABNORMAL HIGH (ref 4.0–10.5)
WBC: 16.1 10*3/uL — ABNORMAL HIGH (ref 4.0–10.5)

## 2010-10-21 LAB — URINALYSIS, ROUTINE W REFLEX MICROSCOPIC
Glucose, UA: NEGATIVE mg/dL
Ketones, ur: NEGATIVE mg/dL
Leukocytes, UA: NEGATIVE
Nitrite: NEGATIVE
Protein, ur: NEGATIVE mg/dL
pH: 6.5 (ref 5.0–8.0)

## 2010-10-21 LAB — BRAIN NATRIURETIC PEPTIDE
Pro B Natriuretic peptide (BNP): 211 pg/mL — ABNORMAL HIGH (ref 0.0–100.0)
Pro B Natriuretic peptide (BNP): 351 pg/mL — ABNORMAL HIGH (ref 0.0–100.0)

## 2010-10-21 LAB — BASIC METABOLIC PANEL
BUN: 19 mg/dL (ref 6–23)
BUN: 22 mg/dL (ref 6–23)
BUN: 23 mg/dL (ref 6–23)
BUN: 25 mg/dL — ABNORMAL HIGH (ref 6–23)
BUN: 22 mg/dL (ref 6–23)
BUN: 23 mg/dL (ref 6–23)
BUN: 29 mg/dL — ABNORMAL HIGH (ref 6–23)
BUN: 32 mg/dL — ABNORMAL HIGH (ref 6–23)
BUN: 38 mg/dL — ABNORMAL HIGH (ref 6–23)
CO2: 27 mEq/L (ref 19–32)
CO2: 26 mEq/L (ref 19–32)
CO2: 27 mEq/L (ref 19–32)
CO2: 27 mEq/L (ref 19–32)
CO2: 27 mEq/L (ref 19–32)
CO2: 28 mEq/L (ref 19–32)
CO2: 29 mEq/L (ref 19–32)
CO2: 31 mEq/L (ref 19–32)
Calcium: 7.6 mg/dL — ABNORMAL LOW (ref 8.4–10.5)
Calcium: 8.1 mg/dL — ABNORMAL LOW (ref 8.4–10.5)
Calcium: 8.1 mg/dL — ABNORMAL LOW (ref 8.4–10.5)
Calcium: 7.2 mg/dL — ABNORMAL LOW (ref 8.4–10.5)
Calcium: 7.3 mg/dL — ABNORMAL LOW (ref 8.4–10.5)
Calcium: 7.5 mg/dL — ABNORMAL LOW (ref 8.4–10.5)
Calcium: 7.5 mg/dL — ABNORMAL LOW (ref 8.4–10.5)
Calcium: 7.5 mg/dL — ABNORMAL LOW (ref 8.4–10.5)
Chloride: 104 mEq/L (ref 96–112)
Chloride: 103 mEq/L (ref 96–112)
Chloride: 99 mEq/L (ref 96–112)
Chloride: 99 mEq/L (ref 96–112)
Chloride: 105 mEq/L (ref 96–112)
Chloride: 108 mEq/L (ref 96–112)
Chloride: 109 mEq/L (ref 96–112)
Creatinine, Ser: 1.22 mg/dL (ref 0.4–1.5)
Creatinine, Ser: 1.33 mg/dL (ref 0.4–1.5)
Creatinine, Ser: 1.44 mg/dL (ref 0.4–1.5)
Creatinine, Ser: 1.14 mg/dL (ref 0.4–1.5)
Creatinine, Ser: 1.21 mg/dL (ref 0.4–1.5)
Creatinine, Ser: 1.22 mg/dL (ref 0.4–1.5)
Creatinine, Ser: 1.25 mg/dL (ref 0.4–1.5)
Creatinine, Ser: 1.25 mg/dL (ref 0.4–1.5)
GFR calc Af Amer: >60 mL/min (ref >60)
GFR calc Af Amer: >60 mL/min (ref >60)
GFR calc Af Amer: >60 mL/min (ref >60)
GFR calc Af Amer: >60 mL/min (ref >60)
GFR calc Af Amer: >60 mL/min (ref >60)
GFR calc Af Amer: >60 mL/min (ref >60)
GFR calc non Af Amer: 53 mL/min — ABNORMAL LOW (ref >60)
GFR calc non Af Amer: 59 mL/min — ABNORMAL LOW (ref >60)
GFR calc non Af Amer: >60 mL/min (ref >60)
GFR calc non Af Amer: >60 mL/min (ref >60)
GFR calc non Af Amer: >60 mL/min (ref >60)
Glucose, Bld: 103 mg/dL — ABNORMAL HIGH (ref 70–99)
Glucose, Bld: 103 mg/dL — ABNORMAL HIGH (ref 70–99)
Glucose, Bld: 117 mg/dL — ABNORMAL HIGH (ref 70–99)
Glucose, Bld: 158 mg/dL — ABNORMAL HIGH (ref 70–99)
Glucose, Bld: 168 mg/dL — ABNORMAL HIGH (ref 70–99)
Glucose, Bld: 174 mg/dL — ABNORMAL HIGH (ref 70–99)
Glucose, Bld: 176 mg/dL — ABNORMAL HIGH (ref 70–99)
Glucose, Bld: 81 mg/dL (ref 70–99)
Potassium: 4.3 mEq/L (ref 3.5–5.1)
Potassium: 4.8 mEq/L (ref 3.5–5.1)
Potassium: 3.8 mEq/L (ref 3.5–5.1)
Potassium: 3.8 mEq/L (ref 3.5–5.1)
Potassium: 4.1 mEq/L (ref 3.5–5.1)
Potassium: 4.3 mEq/L (ref 3.5–5.1)
Sodium: 135 mEq/L (ref 135–145)
Sodium: 139 mEq/L (ref 135–145)
Sodium: 139 mEq/L (ref 135–145)
Sodium: 145 mEq/L (ref 135–145)
Sodium: 145 mEq/L (ref 135–145)
Sodium: 146 mEq/L — ABNORMAL HIGH (ref 135–145)

## 2010-10-21 LAB — CULTURE, BLOOD (ROUTINE X 2): Culture: NO GROWTH

## 2010-10-21 LAB — EXPECTORATED SPUTUM ASSESSMENT W GRAM STAIN, RFLX TO RESP C

## 2010-10-21 LAB — COMPREHENSIVE METABOLIC PANEL
ALT: 18 U/L (ref 0–53)
AST: 34 U/L (ref 0–37)
Alkaline Phosphatase: 171 U/L — ABNORMAL HIGH (ref 39–117)
Alkaline Phosphatase: 79 U/L (ref 39–117)
BUN: 21 mg/dL (ref 6–23)
CO2: 25 mEq/L (ref 19–32)
Chloride: 104 mEq/L (ref 96–112)
Chloride: 110 mEq/L (ref 96–112)
Creatinine, Ser: 1.15 mg/dL (ref 0.4–1.5)
GFR calc Af Amer: >60 mL/min (ref >60)
GFR calc non Af Amer: >60 mL/min (ref >60)
Glucose, Bld: 146 mg/dL — ABNORMAL HIGH (ref 70–99)
Potassium: 4.5 mEq/L (ref 3.5–5.1)
Potassium: 3.8 mEq/L (ref 3.5–5.1)
Sodium: 143 mEq/L (ref 135–145)
Total Bilirubin: 0.5 mg/dL (ref 0.3–1.2)
Total Bilirubin: 0.8 mg/dL (ref 0.3–1.2)

## 2010-10-21 LAB — RETICULOCYTES
RBC.: 2.89 MIL/uL — ABNORMAL LOW (ref 4.22–5.81)
Retic Count, Absolute: 66.5 10*3/uL (ref 19.0–186.0)
Retic Count, Absolute: 115.2 10*3/uL (ref 19.0–186.0)

## 2010-10-21 LAB — POCT I-STAT 3, ART BLOOD GAS (G3+)
pCO2 arterial: 31.6 mmHg — ABNORMAL LOW (ref 35.0–45.0)
pH, Arterial: 7.485 — ABNORMAL HIGH (ref 7.350–7.450)

## 2010-10-21 LAB — URINE MICROSCOPIC-ADD ON

## 2010-10-21 LAB — BLOOD GAS, ARTERIAL
Acid-Base Excess: 1.3 mmol/L (ref 0.0–2.0)
Delivery systems: POSITIVE
Drawn by: 317871
Inspiratory PAP: 12
O2 Saturation: 99.4 %
TCO2: 23.9 mmol/L (ref 0–100)

## 2010-10-21 LAB — APTT
aPTT: 36 seconds (ref 24–37)
aPTT: 31 seconds (ref 24–37)

## 2010-10-21 LAB — DIFFERENTIAL
Basophils Absolute: 0 10*3/uL (ref 0.0–0.1)
Basophils Relative: 0 % (ref 0–1)
Monocytes Absolute: 0.6 10*3/uL (ref 0.1–1.0)
Neutro Abs: 10.9 10*3/uL — ABNORMAL HIGH (ref 1.7–7.7)
Neutrophils Relative %: 84 % — ABNORMAL HIGH (ref 43–77)

## 2010-10-21 LAB — IRON AND TIBC
Iron: 26 ug/dL — ABNORMAL LOW (ref 42–135)
Saturation Ratios: 32 % (ref 20–55)
TIBC: 121 ug/dL — ABNORMAL LOW (ref 215–435)
TIBC: 151 ug/dL — ABNORMAL LOW (ref 215–435)

## 2010-10-21 LAB — LIPASE, BLOOD: Lipase: 41 U/L (ref 11–59)

## 2010-10-21 LAB — CULTURE, RESPIRATORY W GRAM STAIN

## 2010-10-21 LAB — VITAMIN B12
Vitamin B-12: 312 pg/mL (ref 211–911)
Vitamin B-12: 714 pg/mL (ref 211–911)

## 2010-10-21 LAB — PROTIME-INR
INR: 1.49 (ref 0.00–1.49)
Prothrombin Time: 16.4 seconds — ABNORMAL HIGH (ref 11.6–15.2)

## 2010-10-21 LAB — ANA: Anti Nuclear Antibody(ANA): NEGATIVE

## 2010-10-21 LAB — MAGNESIUM: Magnesium: 2.6 mg/dL — ABNORMAL HIGH (ref 1.5–2.5)

## 2010-10-21 LAB — POCT CARDIAC MARKERS
CKMB, poc: <1 ng/mL — ABNORMAL LOW (ref 1.0–8.0)
Myoglobin, poc: 51.9 ng/mL (ref 12–200)
Troponin i, poc: <0.05 ng/mL (ref 0.00–0.09)

## 2010-10-21 LAB — FERRITIN: Ferritin: 864 ng/mL — ABNORMAL HIGH (ref 22–322)

## 2010-10-21 LAB — MRSA PCR SCREENING: MRSA by PCR: NEGATIVE

## 2010-10-21 LAB — PHOSPHORUS: Phosphorus: 3.7 mg/dL (ref 2.3–4.6)

## 2010-10-21 LAB — HEMOGLOBIN A1C: Mean Plasma Glucose: 114 mg/dL (ref <117)

## 2010-10-22 LAB — CARDIAC PANEL(CRET KIN+CKTOT+MB+TROPI)
CK, MB: 0.7 ng/mL (ref 0.3–4.0)
CK, MB: 0.7 ng/mL (ref 0.3–4.0)
Relative Index: INVALID (ref 0.0–2.5)
Relative Index: INVALID (ref 0.0–2.5)
Relative Index: INVALID (ref 0.0–2.5)
Total CK: 31 U/L (ref 7–232)
Total CK: 36 U/L (ref 7–232)
Troponin I: 0.52 ng/mL (ref 0.00–0.06)

## 2010-10-22 LAB — CBC
HCT: 29 % — ABNORMAL LOW (ref 39.0–52.0)
HCT: 31.2 % — ABNORMAL LOW (ref 39.0–52.0)
HCT: 31.3 % — ABNORMAL LOW (ref 39.0–52.0)
HCT: 32.1 % — ABNORMAL LOW (ref 39.0–52.0)
HCT: 36.4 % — ABNORMAL LOW (ref 39.0–52.0)
Hemoglobin: 10.3 g/dL — ABNORMAL LOW (ref 13.0–17.0)
Hemoglobin: 10.5 g/dL — ABNORMAL LOW (ref 13.0–17.0)
Hemoglobin: 10.6 g/dL — ABNORMAL LOW (ref 13.0–17.0)
Hemoglobin: 10.7 g/dL — ABNORMAL LOW (ref 13.0–17.0)
Hemoglobin: 11.1 g/dL — ABNORMAL LOW (ref 13.0–17.0)
Hemoglobin: 12.6 g/dL — ABNORMAL LOW (ref 13.0–17.0)
MCH: 29.2 pg (ref 26.0–34.0)
MCH: 29.4 pg (ref 26.0–34.0)
MCH: 29.7 pg (ref 26.0–34.0)
MCH: 29.8 pg (ref 26.0–34.0)
MCH: 30.6 pg (ref 26.0–34.0)
MCHC: 33.8 g/dL (ref 30.0–36.0)
MCHC: 33.9 g/dL (ref 30.0–36.0)
MCHC: 34.5 g/dL (ref 30.0–36.0)
MCV: 86.4 fL (ref 78.0–100.0)
MCV: 86.4 fL (ref 78.0–100.0)
MCV: 87.3 fL (ref 78.0–100.0)
MCV: 87.6 fL (ref 78.0–100.0)
Platelets: 282 10*3/uL (ref 150–400)
Platelets: 404 10*3/uL — ABNORMAL HIGH (ref 150–400)
Platelets: 567 10*3/uL — ABNORMAL HIGH (ref 150–400)
Platelets: 336 10*3/uL (ref 150–400)
RBC: 3.17 MIL/uL — ABNORMAL LOW (ref 4.22–5.81)
RBC: 3.47 MIL/uL — ABNORMAL LOW (ref 4.22–5.81)
RBC: 3.52 MIL/uL — ABNORMAL LOW (ref 4.22–5.81)
RBC: 3.64 MIL/uL — ABNORMAL LOW (ref 4.22–5.81)
RBC: 3.83 MIL/uL — ABNORMAL LOW (ref 4.22–5.81)
RDW: 13.7 % (ref 11.5–15.5)
RDW: 14.2 % (ref 11.5–15.5)
RDW: 14.7 % (ref 11.5–15.5)
RDW: 12.8 % (ref 11.5–15.5)
WBC: 10.7 10*3/uL — ABNORMAL HIGH (ref 4.0–10.5)
WBC: 11.8 10*3/uL — ABNORMAL HIGH (ref 4.0–10.5)
WBC: 12 10*3/uL — ABNORMAL HIGH (ref 4.0–10.5)
WBC: 16.3 10*3/uL — ABNORMAL HIGH (ref 4.0–10.5)
WBC: 9.8 10*3/uL (ref 4.0–10.5)
WBC: 10.1 10*3/uL (ref 4.0–10.5)

## 2010-10-22 LAB — DIFFERENTIAL
Basophils Absolute: 0.1 10*3/uL (ref 0.0–0.1)
Basophils Relative: 0 % (ref 0–1)
Basophils Relative: 1 % (ref 0–1)
Eosinophils Absolute: 0.2 10*3/uL (ref 0.0–0.7)
Eosinophils Absolute: 0.3 10*3/uL (ref 0.0–0.7)
Eosinophils Relative: 1 % (ref 0–5)
Eosinophils Relative: 2 % (ref 0–5)
Eosinophils Relative: 1 % (ref 0–5)
Lymphocytes Relative: 7 % — ABNORMAL LOW (ref 12–46)
Lymphs Abs: 1.3 10*3/uL (ref 0.7–4.0)
Monocytes Absolute: 1.4 10*3/uL — ABNORMAL HIGH (ref 0.1–1.0)
Monocytes Relative: 7 % (ref 3–12)
Monocytes Relative: 9 % (ref 3–12)
Neutro Abs: 12.8 10*3/uL — ABNORMAL HIGH (ref 1.7–7.7)
Neutro Abs: 8.4 10*3/uL — ABNORMAL HIGH (ref 1.7–7.7)
Neutrophils Relative %: 80 % — ABNORMAL HIGH (ref 43–77)
Neutrophils Relative %: 83 % — ABNORMAL HIGH (ref 43–77)

## 2010-10-22 LAB — BASIC METABOLIC PANEL
BUN: 13 mg/dL (ref 6–23)
BUN: 14 mg/dL (ref 6–23)
BUN: 14 mg/dL (ref 6–23)
BUN: 19 mg/dL (ref 6–23)
BUN: 11 mg/dL (ref 6–23)
CO2: 23 mEq/L (ref 19–32)
CO2: 24 mEq/L (ref 19–32)
CO2: 25 mEq/L (ref 19–32)
CO2: 27 mEq/L (ref 19–32)
CO2: 27 mEq/L (ref 19–32)
Calcium: 7.2 mg/dL — ABNORMAL LOW (ref 8.4–10.5)
Calcium: 7.3 mg/dL — ABNORMAL LOW (ref 8.4–10.5)
Calcium: 7.4 mg/dL — ABNORMAL LOW (ref 8.4–10.5)
Calcium: 7.6 mg/dL — ABNORMAL LOW (ref 8.4–10.5)
Calcium: 7.7 mg/dL — ABNORMAL LOW (ref 8.4–10.5)
Chloride: 102 mEq/L (ref 96–112)
Chloride: 111 mEq/L (ref 96–112)
Creatinine, Ser: 1 mg/dL (ref 0.4–1.5)
Creatinine, Ser: 1.14 mg/dL (ref 0.4–1.5)
Creatinine, Ser: 1.01 mg/dL (ref 0.4–1.5)
GFR calc Af Amer: >60 mL/min (ref >60)
GFR calc Af Amer: >60 mL/min (ref >60)
GFR calc Af Amer: >60 mL/min (ref >60)
GFR calc Af Amer: >60 mL/min (ref >60)
GFR calc Af Amer: >60 mL/min (ref >60)
GFR calc non Af Amer: >60 mL/min (ref >60)
GFR calc non Af Amer: >60 mL/min (ref >60)
GFR calc non Af Amer: >60 mL/min (ref >60)
GFR calc non Af Amer: >60 mL/min (ref >60)
GFR calc non Af Amer: >60 mL/min (ref >60)
GFR calc non Af Amer: >60 mL/min (ref >60)
GFR calc non Af Amer: >60 mL/min (ref >60)
Glucose, Bld: 118 mg/dL — ABNORMAL HIGH (ref 70–99)
Glucose, Bld: 126 mg/dL — ABNORMAL HIGH (ref 70–99)
Glucose, Bld: 127 mg/dL — ABNORMAL HIGH (ref 70–99)
Glucose, Bld: 95 mg/dL (ref 70–99)
Potassium: 2.8 mEq/L — ABNORMAL LOW (ref 3.5–5.1)
Potassium: 3.2 mEq/L — ABNORMAL LOW (ref 3.5–5.1)
Potassium: 4 mEq/L (ref 3.5–5.1)
Sodium: 138 mEq/L (ref 135–145)
Sodium: 140 mEq/L (ref 135–145)
Sodium: 142 mEq/L (ref 135–145)
Sodium: 144 mEq/L (ref 135–145)
Sodium: 144 mEq/L (ref 135–145)

## 2010-10-22 LAB — AFB CULTURE WITH SMEAR (NOT AT ARMC)
Acid Fast Smear: NONE SEEN
Acid Fast Smear: NONE SEEN
Acid Fast Smear: NONE SEEN
Special Requests: ABNORMAL

## 2010-10-22 LAB — FUNGUS CULTURE W SMEAR
Fungal Smear: NONE SEEN
Special Requests: ABNORMAL

## 2010-10-22 LAB — URINALYSIS, ROUTINE W REFLEX MICROSCOPIC
Glucose, UA: NEGATIVE mg/dL
Hgb urine dipstick: NEGATIVE
Nitrite: NEGATIVE
Specific Gravity, Urine: 1.019 (ref 1.005–1.030)
pH: 6 (ref 5.0–8.0)

## 2010-10-22 LAB — CULTURE, BLOOD (ROUTINE X 2)
Culture: NO GROWTH
Culture: NO GROWTH

## 2010-10-22 LAB — MRSA PCR SCREENING: MRSA by PCR: NEGATIVE

## 2010-10-22 LAB — COMPREHENSIVE METABOLIC PANEL
Albumin: 1.4 g/dL — ABNORMAL LOW (ref 3.5–5.2)
Alkaline Phosphatase: 96 U/L (ref 39–117)
BUN: 20 mg/dL (ref 6–23)
Calcium: 7.7 mg/dL — ABNORMAL LOW (ref 8.4–10.5)
Creatinine, Ser: 1.29 mg/dL (ref 0.4–1.5)
Glucose, Bld: 117 mg/dL — ABNORMAL HIGH (ref 70–99)
Potassium: 3.8 mEq/L (ref 3.5–5.1)
Total Protein: 5.5 g/dL — ABNORMAL LOW (ref 6.0–8.3)

## 2010-10-22 LAB — CK TOTAL AND CKMB (NOT AT ARMC)
CK, MB: 0.9 ng/mL (ref 0.3–4.0)
Relative Index: INVALID (ref 0.0–2.5)
Total CK: 31 U/L (ref 7–232)

## 2010-10-22 LAB — CULTURE, RESPIRATORY W GRAM STAIN: Culture: NORMAL

## 2010-10-22 LAB — TROPONIN I: Troponin I: 0.03 ng/mL (ref 0.00–0.06)

## 2010-10-22 LAB — PROTIME-INR
INR: 1.91 — ABNORMAL HIGH (ref 0.00–1.49)
Prothrombin Time: 22 seconds — ABNORMAL HIGH (ref 11.6–15.2)
Prothrombin Time: 23.6 seconds — ABNORMAL HIGH (ref 11.6–15.2)

## 2010-10-22 LAB — EXPECTORATED SPUTUM ASSESSMENT W GRAM STAIN, RFLX TO RESP C

## 2010-10-22 LAB — TYPE AND SCREEN: ABO/RH(D): O POS

## 2010-10-22 LAB — LEGIONELLA ANTIGEN, URINE

## 2010-10-22 LAB — TSH: TSH: 3.841 u[IU]/mL (ref 0.350–4.500)

## 2010-10-22 LAB — HIV ANTIBODY (ROUTINE TESTING W REFLEX): HIV: NONREACTIVE

## 2010-10-22 LAB — PREPARE FRESH FROZEN PLASMA

## 2010-10-25 ENCOUNTER — Ambulatory Visit (INDEPENDENT_AMBULATORY_CARE_PROVIDER_SITE_OTHER): Payer: Medicare Other | Admitting: Gastroenterology

## 2010-10-25 ENCOUNTER — Encounter: Payer: Self-pay | Admitting: Gastroenterology

## 2010-10-25 DIAGNOSIS — R1319 Other dysphagia: Secondary | ICD-10-CM

## 2010-10-25 DIAGNOSIS — R109 Unspecified abdominal pain: Secondary | ICD-10-CM

## 2010-10-25 DIAGNOSIS — R143 Flatulence: Secondary | ICD-10-CM | POA: Insufficient documentation

## 2010-10-25 DIAGNOSIS — R142 Eructation: Secondary | ICD-10-CM

## 2010-10-25 DIAGNOSIS — R141 Gas pain: Secondary | ICD-10-CM

## 2010-10-25 DIAGNOSIS — R1310 Dysphagia, unspecified: Secondary | ICD-10-CM | POA: Insufficient documentation

## 2010-10-25 IMAGING — CR DG CHEST 2V
2 series · 2 of 2 positions shown · non-contrast
Comparison: 01/20/2010

CLINICAL DATA: Short of breath/cough

CHEST - 2 VIEW

[w chest pa]
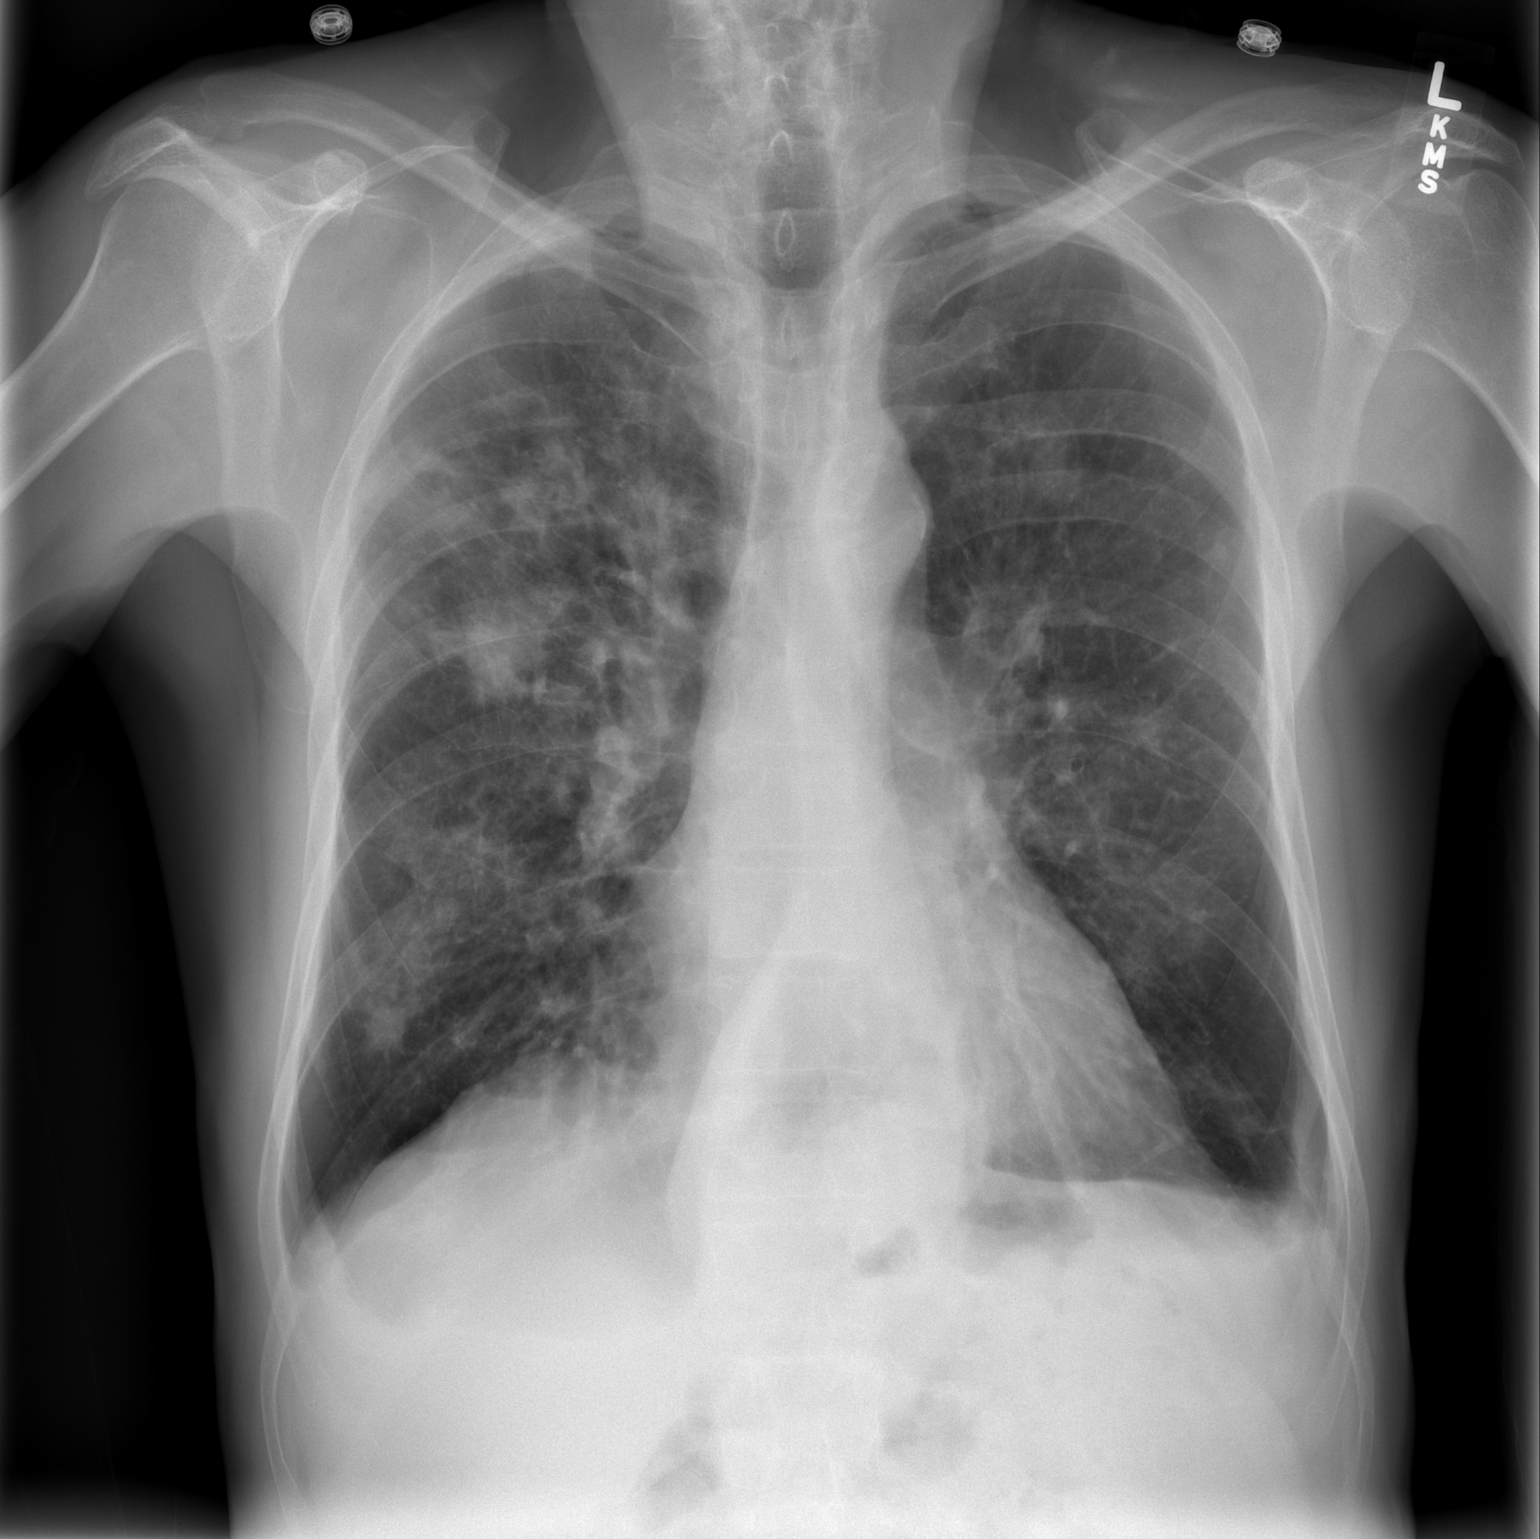

[w chest lat]
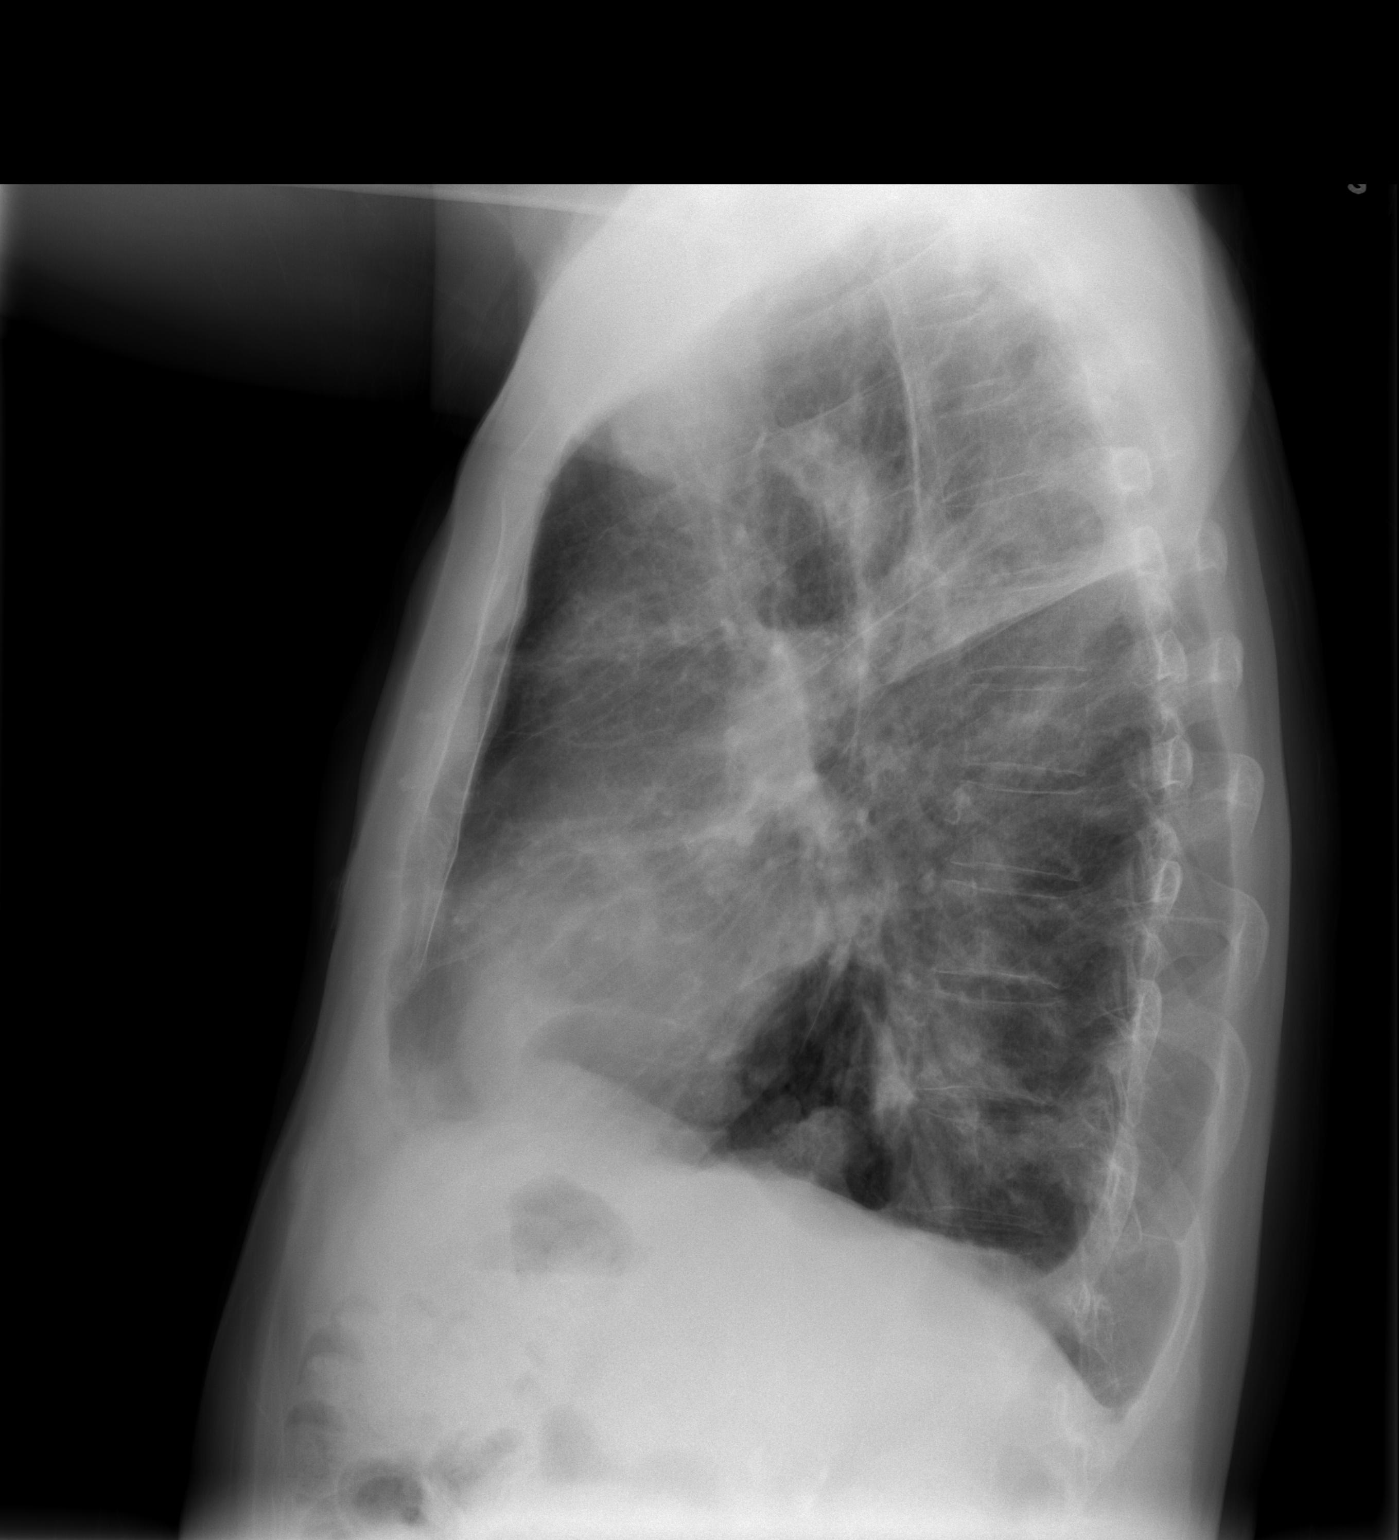

[2 of 2 positions shown; findings below may reference images not displayed]

FINDINGS: There is new right upper lobe, right lower lobe, and
possibly right middle lobe airspace disease.  Left lung clear.
COPD noted.  No significant pleural fluid.  Heart size normal.
Hiatal hernia noted.
IMPRESSION: New right lung airspace infiltrates consistent with acute
pneumonia.

## 2010-10-26 LAB — BASIC METABOLIC PANEL
BUN: 13 mg/dL (ref 6–23)
Chloride: 106 mEq/L (ref 96–112)
Creatinine, Ser: 1.13 mg/dL (ref 0.4–1.5)

## 2010-10-26 LAB — DIFFERENTIAL
Basophils Relative: 1 % (ref 0–1)
Eosinophils Absolute: 0 10*3/uL (ref 0.0–0.7)
Lymphs Abs: 1.6 10*3/uL (ref 0.7–4.0)
Neutrophils Relative %: 71 % (ref 43–77)

## 2010-10-26 LAB — PROTIME-INR
INR: 1.09 (ref 0.00–1.49)
Prothrombin Time: 14 seconds (ref 11.6–15.2)

## 2010-10-26 LAB — CBC
MCV: 90.7 fL (ref 78.0–100.0)
Platelets: 210 10*3/uL (ref 150–400)
WBC: 7.8 10*3/uL (ref 4.0–10.5)

## 2010-10-26 LAB — APTT: aPTT: 32 seconds (ref 24–37)

## 2010-11-01 ENCOUNTER — Encounter: Payer: Self-pay | Admitting: Gastroenterology

## 2010-11-04 NOTE — Letter (Signed)
Summary: EGD Instructions  [facility]  [address] [place], [place] [address]   Phone: [phone]  Fax: [phone]       Richard [name]    07-20-1936    MRN: [id number]       Procedure Day /Date: Monday April 23rd, 2012     Arrival Time:  1:30pm     Procedure Time: 2:30pm     Location of Procedure:                    _ x _ [facility] (4th Floor)    PREPARATION FOR ENDOSCOPY   On 11/29/10 THE DAY OF THE PROCEDURE:  1.   No solid foods, milk or milk products are allowed after midnight the night before your procedure.  2.   Do not drink anything colored red or purple.  Avoid juices with pulp.  No orange juice.  3.  You may drink clear liquids until 12:30pm, which is 2 hours before your procedure.                                                                                                CLEAR LIQUIDS INCLUDE: Water Jello Ice Popsicles Tea (sugar ok, no milk/cream) Powdered fruit flavored drinks Coffee (sugar ok, no milk/cream) Gatorade Juice: apple, white grape, white cranberry  Lemonade Clear bullion, consomm, broth Carbonated beverages (any kind) Strained chicken noodle soup Hard Candy   MEDICATION INSTRUCTIONS  Unless otherwise instructed, you should take regular prescription medications with a small sip of water as early as possible the morning of your procedure.         OTHER INSTRUCTIONS  You will need a responsible adult at least 73 years of age to accompany you and drive you home.   This person must remain in the waiting room during your procedure.  Wear loose fitting clothing that is easily removed.  Leave jewelry and other valuables at home.  However, you may wish to bring a book to read or an iPod/MP3 player to listen to music as you wait for your procedure to start.  Remove all body piercing jewelry and leave at home.  Total time from sign-in until discharge is approximately 2-3 hours.  You should go home  directly after your procedure and rest.  You can resume normal activities the day after your procedure.  The day of your procedure you should not:   Drive   Make legal decisions   Operate machinery   Drink alcohol   Return to work  You will receive specific instructions about eating, activities and medications before you leave.    The above instructions have been reviewed and explained to me by   _______________________    I fully understand and can verbalize these instructions _____________________________ Date _________

## 2010-11-04 NOTE — Assessment & Plan Note (Signed)
Summary: abd pain   History of Present Illness Visit Type: Initial Visit Primary GI MD: Elie Goody MD [facility] Primary Provider: Rickard Patience, MD Chief Complaint: Patient complains of lower abdominal pain for about 2 months. The pain started after he ate peanuts. He does complain of some dysphagia with liquids.   History of Present Illness:    This is a 75 year old male who relates a 2 month history of intermittent mild abdominal pain associated with intestinal gas. He describes brief episodes of mild abdominal pain generally associated with gas and generally lasting only several minutes. Occasionally lower abdnominal discomfort is relieved by bowel movement. His symptoms started after eating peanuts 2 months ago pain since discontinued peanuts. He underwent colonoscopy by Dr. Loreta Ave several years ago but cannot recall the date or the findings.  He underwent a barium esophagram in August 2011 that showed a distal esophageal stricture. He notes occasional  difficulty when swallowing water but does not have any  dysphagia to solids. He does have a history of esophageal strictures and has undergone several dilations in the past by Dr. Sheryn Bison, most recently in 1998.   GI Review of Systems    Reports abdominal pain, bloating, dysphagia with liquids, and  loss of appetite.     Location of  Abdominal pain: lower abdomen.    Denies acid reflux, belching, chest pain, dysphagia with solids, heartburn, nausea, vomiting, vomiting blood, weight loss, and  weight gain.        Denies anal fissure, black tarry stools, change in bowel habit, constipation, diarrhea, diverticulosis, fecal incontinence, heme positive stool, hemorrhoids, irritable bowel syndrome, jaundice, light color stool, liver problems, rectal bleeding, and  rectal pain.   Current Medications (verified): 1)  Symbicort 160-4.5 Mcg/act Aero (Budesonide-Formoterol Fumarate) .... 2 Puffs Two Times A Day 2)  Bisoprolol Fumarate 5 Mg  Tabs (Bisoprolol Fumarate) .... 1/2 Once Daily 3)  Flomax 0.4 Mg Xr24h-Cap (Tamsulosin Hcl) .Marland Kitchen.. 1 By Mouth Once Daily 4)  Nexium 40 Mg Cpdr (Esomeprazole Magnesium) .... Once Daily 5)  Cyclophosphamide 25 Mg Tabs (Cyclophosphamide) .... 3 Once Daily 6)  Prednisone 5 Mg Tabs (Prednisone) .... 1/2 Odd Days X 2 Weeks Then Stop 7)  Avodart 0.5 Mg Caps (Dutasteride) .Marland Kitchen.. 1 Every Other Day 8)  Mucinex Dm 30-600 Mg Xr12h-Tab (Dextromethorphan-Guaifenesin) .... 2 Every 12 Hours 9)  Sulfamethoxazole-Tmp Ds 800-160 Mg Tabs (Sulfamethoxazole-Trimethoprim) .Marland Kitchen.. 1 Every Mon Wed and Fri 10)  Dulcolax 5 Mg Tbec (Bisacodyl) .... As Per Bottle Directions As Needed 11)  *02  2lpm At Rest and If Short of Breath Waliking 3 Lpm 12)  Aspirin 81 Mg Tbec (Aspirin) .... Take One Tablet By Mouth Daily 13)  Zostrix 0.025 % Crea (Capsaicin) .... Apply Two Times A Day 14)  Senokot S 8.6-50 Mg Tabs (Sennosides-Docusate Sodium) .... Take As Directed Per Box 15)  Tylenol 325 Mg Tabs (Acetaminophen) .... Take As Directed Per Bottle 16)  Tramadol Hcl 50 Mg Tabs (Tramadol Hcl) .... Take One To Tow Tabs Every 4 Hrs As Needed 17)  Hydromet 5-1.5 Mg/46ml Syrp (Hydrocodone-Homatropine) .... 1/2 1 Tsp Every 4-6 Hrs As Needed Cough  Allergies (verified): No Known Drug Allergies  Past History:  Past Medical History: Reviewed history from 10/20/2010 and no changes required. Allergic rhinitis GERD- hiatal hernia with reflux     - Repeat Barium Swallow c/w stricture and reflux 03/29/10 Bronchiectasis     - See CT chest 12/14/2009 > progressive bilateral R> L cavities 03/26/10 > clinical  dx Wegener's 04/07/10      - PFTs 02/17/10  FEV1 1.52 (58%) ratio 42 and 15% better after B2, DLC0 118% WEGENER'S GRANULOMATOSIS      - dX BY pos C anca/ supportive TBBX 04/10/10 > CYTOXAN initiated      - Prednisone tapered off March 2012  PAF/ abn echo during [facility] 03/30/10       - ECH0 Aug 23 2011LVH with  low nl ef, mild mr, ok R function, peak  trop .59       - Not anticoagulated due to  WG with Hemoptysis L PTX  04/25/10 R PTX 05/12/10  >   final f/u by [name] 06/01/10 benign hypertrophy of the prostate Complex med regimen--Meds reviewed with pt education and computerized med calendar May 25, 2010  Peptic Strictures Anemia Hypertension  Past Surgical History: Reviewed history from 01/25/2007 and no changes required. HERNIA REPAIR  Family History: Mother- deceased age 89;car wreck Father- deceased age 66; breathing problems;asthma Brother- living age 88; heart  CAD (s/p CABG) Sibling- deceased age 3; heart (s/p CABG) No FH of Colon Cancer:  Social History: Retired Non smoker Married with 3 children Alcohol Use - no  Review of Systems       The pertinent positives and negatives are noted as above and in the HPI. All other ROS were reviewed and were negative.  Vital Signs:  Patient profile:   75 year old male Height:      67 inches Weight:      122.8 pounds BMI:     19.30 Pulse rate:   72 / minute Pulse rhythm:   regular BP sitting:   110 / 60  (left arm) Cuff size:   regular  Vitals Entered By: Harlow Mares CMA Duncan Dull) (October 25, 2010 2:17 PM)  Physical Exam  General:  Well developed, well nourished, no acute distress. Head:  Normocephalic and atraumatic. Eyes:  PERRLA, no icterus. Ears:  Normal auditory acuity. Mouth:  No deformity or lesions, dentition normal. Neck:  Supple; no masses or thyromegaly. Lungs:  Clear throughout to auscultation. Heart:  Regular rate and rhythm; no murmurs, rubs,  or bruits. Abdomen:  Soft, nontender and nondistended. No masses, hepatosplenomegaly or hernias noted. Normal bowel sounds. Rectal:  deferred until time of colonoscopy.   Msk:  Symmetrical with no gross deformities. Normal posture. Pulses:  Normal pulses noted. Extremities:  No clubbing, cyanosis, edema or deformities noted. Neurologic:  Alert and  oriented x4;  grossly normal neurologically. Cervical  Nodes:  No significant cervical adenopathy. Inguinal Nodes:  No significant inguinal adenopathy. Psych:  Alert and cooperative. Normal mood and affect.  Impression & Recommendations:  Problem # 1:  ABDOMINAL PAIN OTHER SPECIFIED SITE (ICD-789.09)  Abdominal pain which is generally associated with gas. Begin a low gas diet and Gas-X 3 times a day when necessary. Review records from Dr. Loreta Ave to determine if a followup colonoscopy is due.  Problem # 2:  OTHER DYSPHAGIA (ICD-787.29)  Mild dysphagia to water with an apparent esophageal stricture on barium esophagram. The risks, benefits and alternatives to endoscopy with possible biopsy and possible dilation were discussed with the patient and they consent to proceed. The procedure will be scheduled electively. Orders: EGD SAV (EGD SAV)  Problem # 3:  GERD (ICD-530.81)  GERD with a history of peptic esophageal strictures.  Continue Nexium 40 mg daily and standard antireflux measures.  Problem # 4:  WEGENER'S GRANULOMATOSIS (ICD-446.4)  Problem # 5:  BRONCHIECTASIS (ICD-494.0)  Patient Instructions: 1)  Upper Endoscopy with Dilatation brochure given.  2)  Start Gas-X three times a day as needed. 3)  Excessive Gas Diet handout given.  4)  Copy sent to : Dianna Limbo, MD 5)  The medication list was reviewed and reconciled.  All changed / newly prescribed medications were explained.  A complete medication list was provided to the patient / caregiver.

## 2010-11-05 IMAGING — CT CT ANGIO CHEST
2 of 3 series · 19 of 32 positions shown · IV contrast (APPLIED)
Comparison: 12/14/2009

CLINICAL DATA: Shortness of breath and cough.

CT ANGIOGRAPHY CHEST WITH CONTRAST
TECHNIQUE: Multidetector CT imaging of the chest was performed
using the standard protocol during bolus administration of
intravenous contrast.  Multiplanar CT image reconstructions
including MIPs were obtained to evaluate the vascular anatomy.
Contrast:  80 ml [id number]

[Series 5: pe thins @ 1mm · axial · 0.70mm/px · z∈[-263,-7]mm · 17 of 289 slices shown]
[im 17/289  lung]
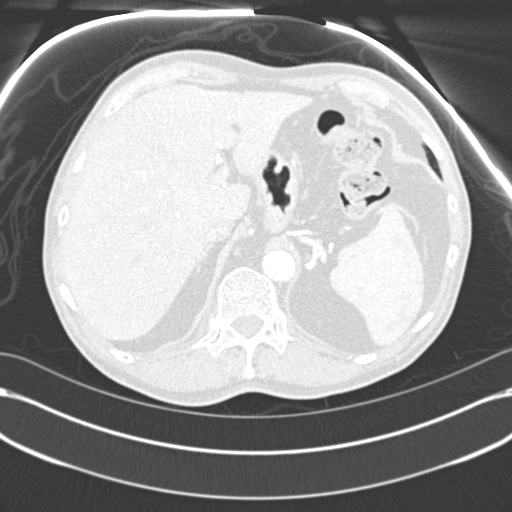
[im 33/289  soft-tissue]
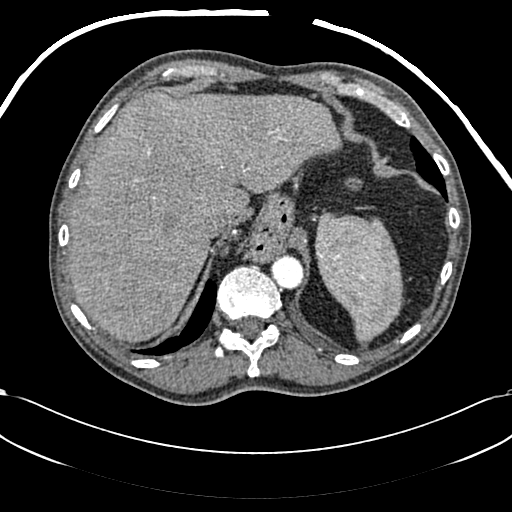
[im 49/289  lung]
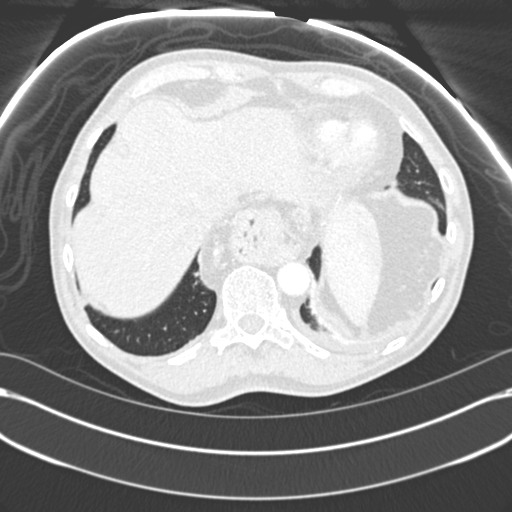
[im 65/289  soft-tissue]
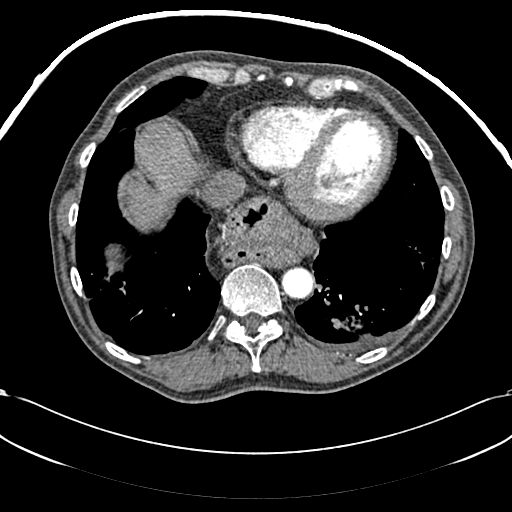
[im 81/289  lung]
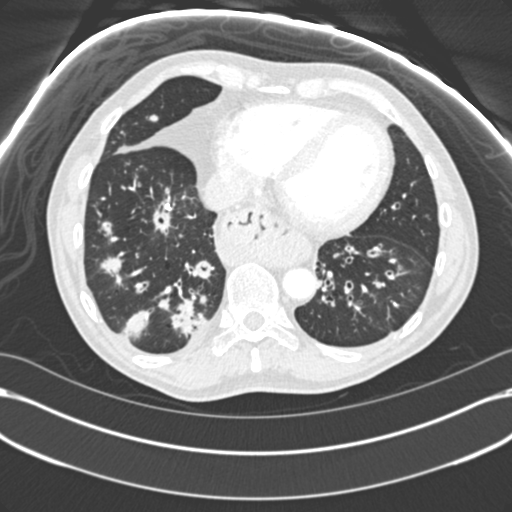
[im 97/289  soft-tissue]
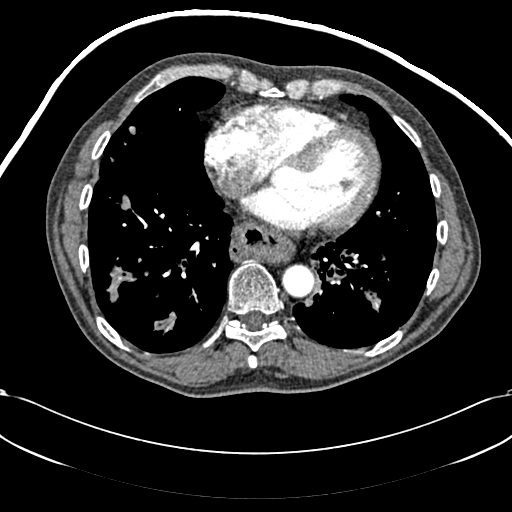
[im 113/289  lung]
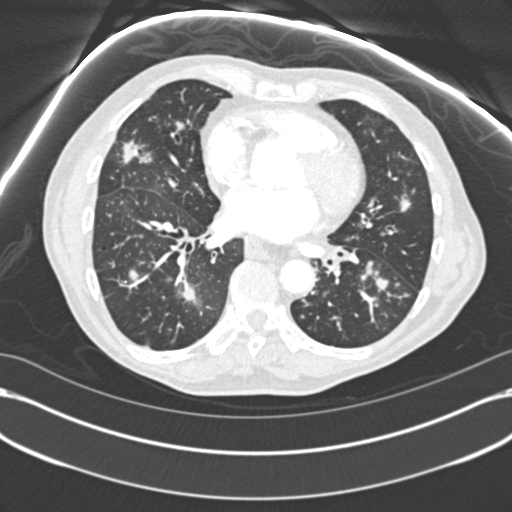
[im 129/289  soft-tissue]
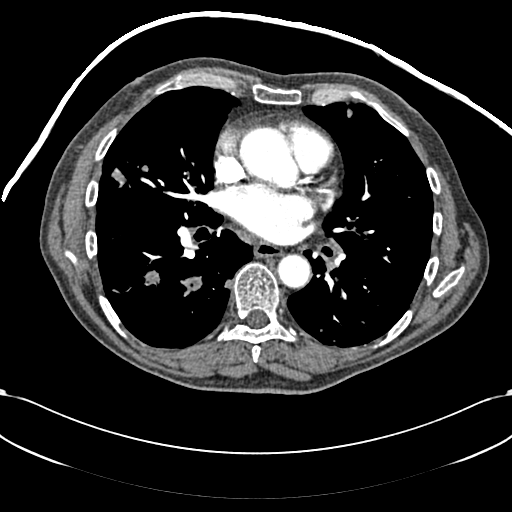
[im 145/289  lung]
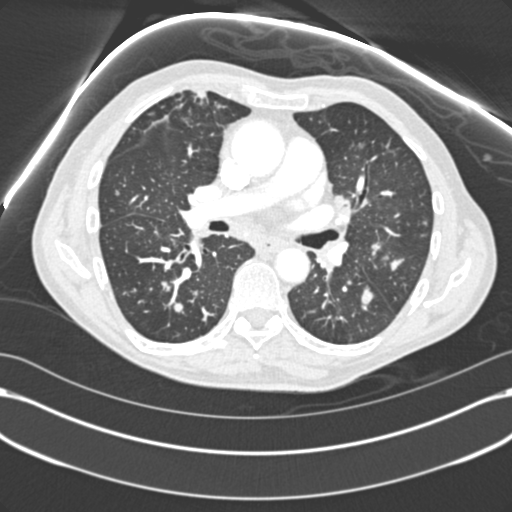
[im 161/289  soft-tissue]
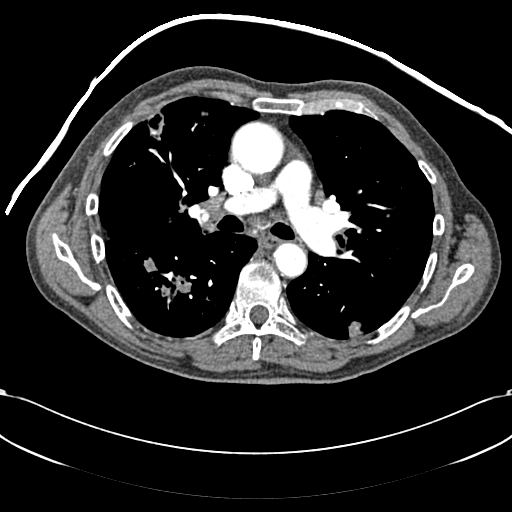
[im 177/289  lung]
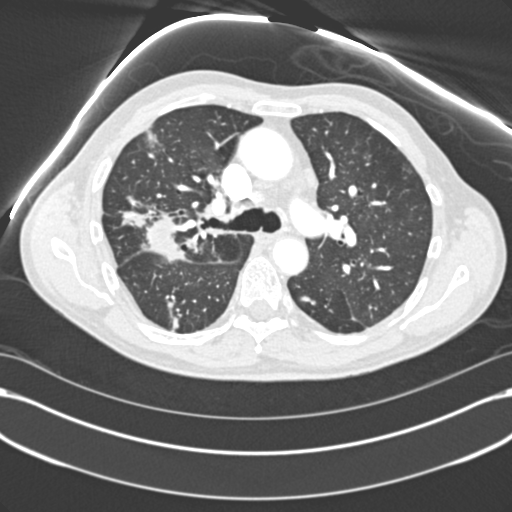
[im 193/289  soft-tissue]
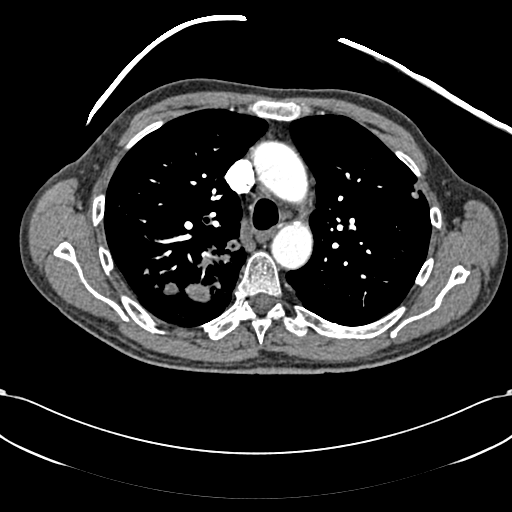
[im 209/289  lung]
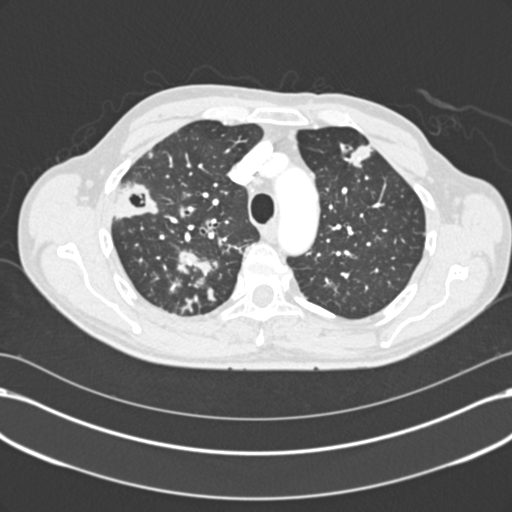
[im 225/289  soft-tissue]
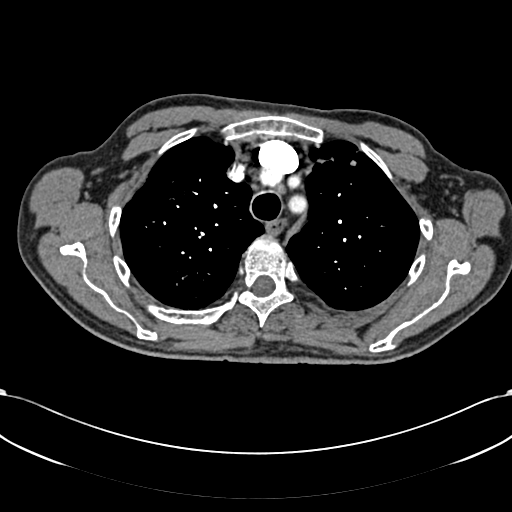
[im 241/289  lung]
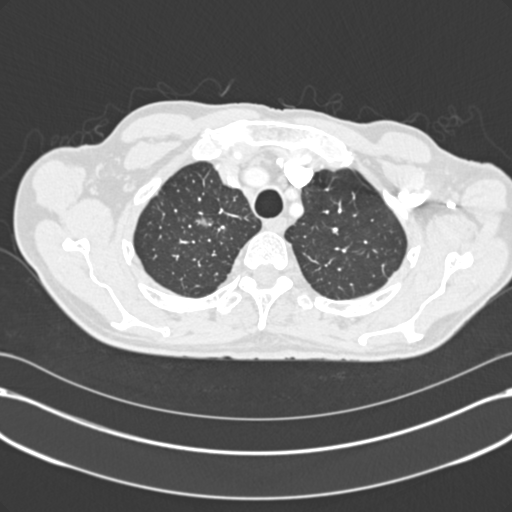
[im 257/289  soft-tissue]
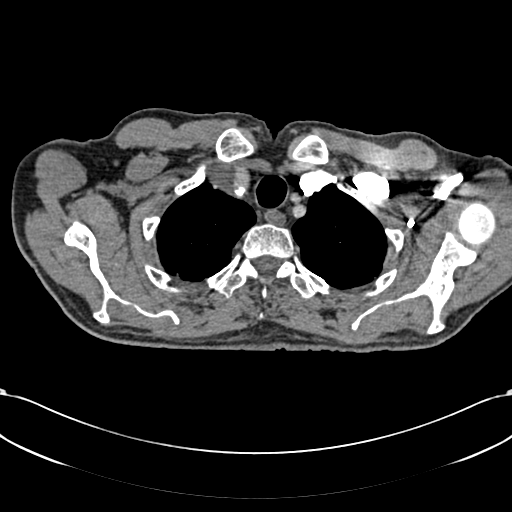
[im 273/289  lung]
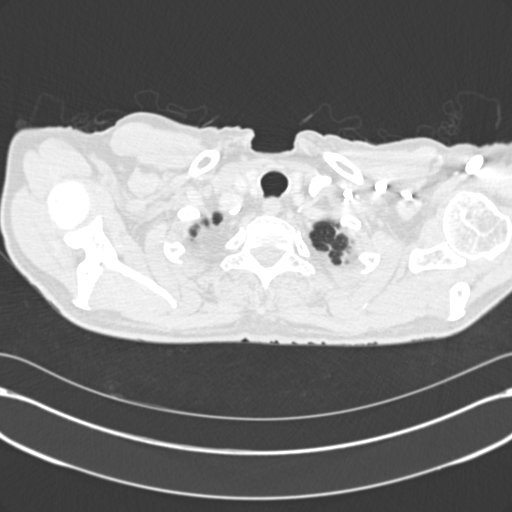

[Series 7: pe lung windows · axial · 0.70mm/px · z∈[-220,-164]mm · 2 of 94 slices shown]
[im 19/94  soft-tissue]
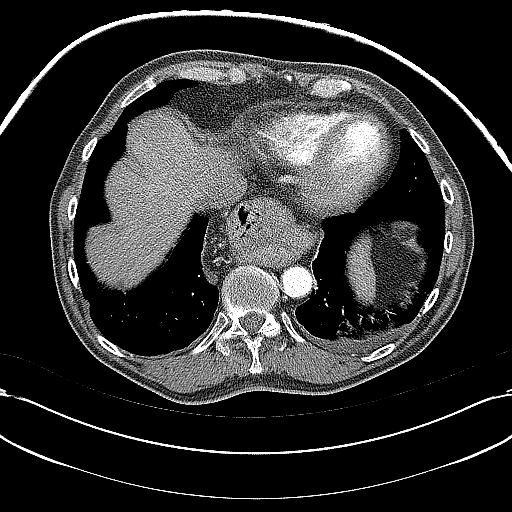
[im 38/94  soft-tissue]
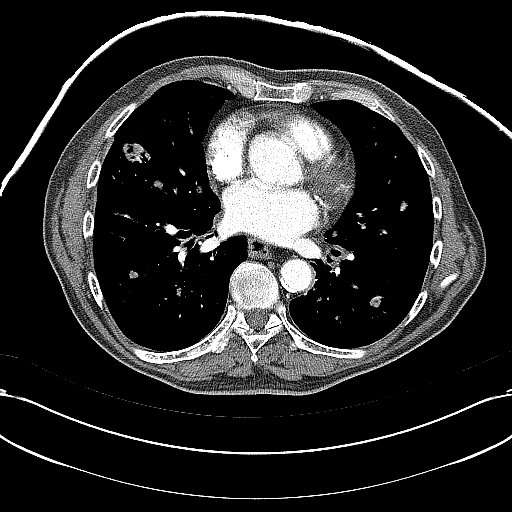

[19 of 32 positions shown; findings below may reference images not displayed]

FINDINGS: Satisfactory opacification of the pulmonary arteries
noted, and there is no evidence of pulmonary emboli. No evidence of
thoracic aortic aneurysm or dissection.  Large hiatal hernia again
noted.

Fluid is seen in multiple posterior lower lobe subsegmental
bronchi, suspicious for aspirated material.  Numerous parabronchial
areas of airspace disease are seen throughout both lungs, several
of which show central cavitation.  This is suspicious for
aspiration pneumonia, with septic emboli and cavitary pulmonary
metastases considered less likely.

Central tracheobronchial airways are patent and there is no
evidence of centrally obstructing mass or lymphadenopathy.  There
is no evidence of pleural or pericardial effusion.

Review of the MIP images confirms the above findings.
IMPRESSION: 1.  No CTA evidence of acute pulmonary embolism.
2.  Multifocal peribronchial air space disease throughout both
lungs, with multiple foci of central cavitation.  Fluid also seen
in several posterior lower lobe subsegmental bronchi.  This is
highly suspicious for aspiration pneumonia, with septic emboli and
cavitary pulmonary metastases considered less likely. Clinical
correlation is recommended.
3.  Large hiatal hernia.

## 2010-11-08 ENCOUNTER — Encounter: Payer: Self-pay | Admitting: Gastroenterology

## 2010-11-08 IMAGING — CR DG CHEST 2V
2 series · 2 of 2 positions shown · non-contrast
Comparison: Chest CT 03/26/2010 and chest x-ray 03/24/2010

CLINICAL DATA: Shortness of breath and cough.

CHEST - 2 VIEW

[w chest pa]
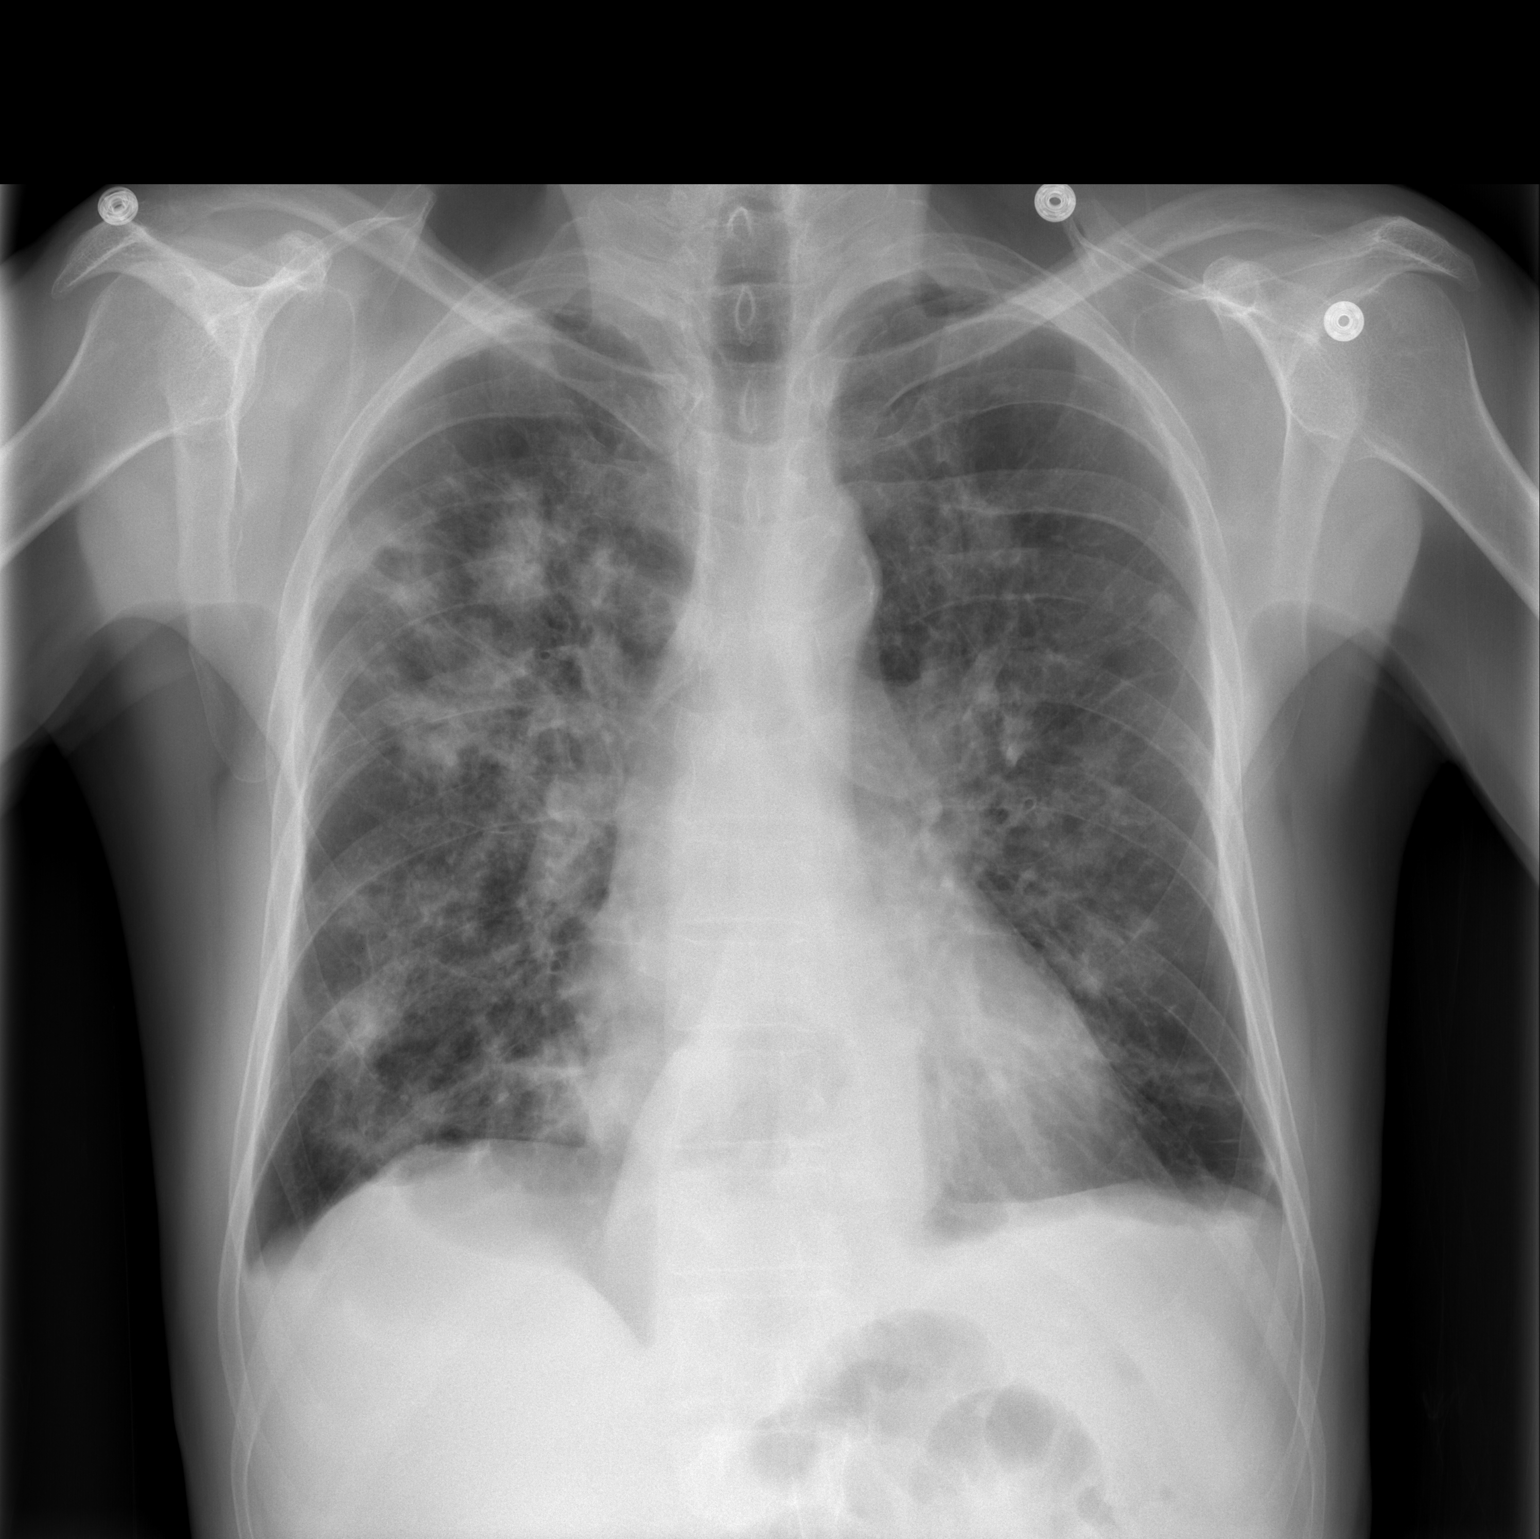

[w chest lat]
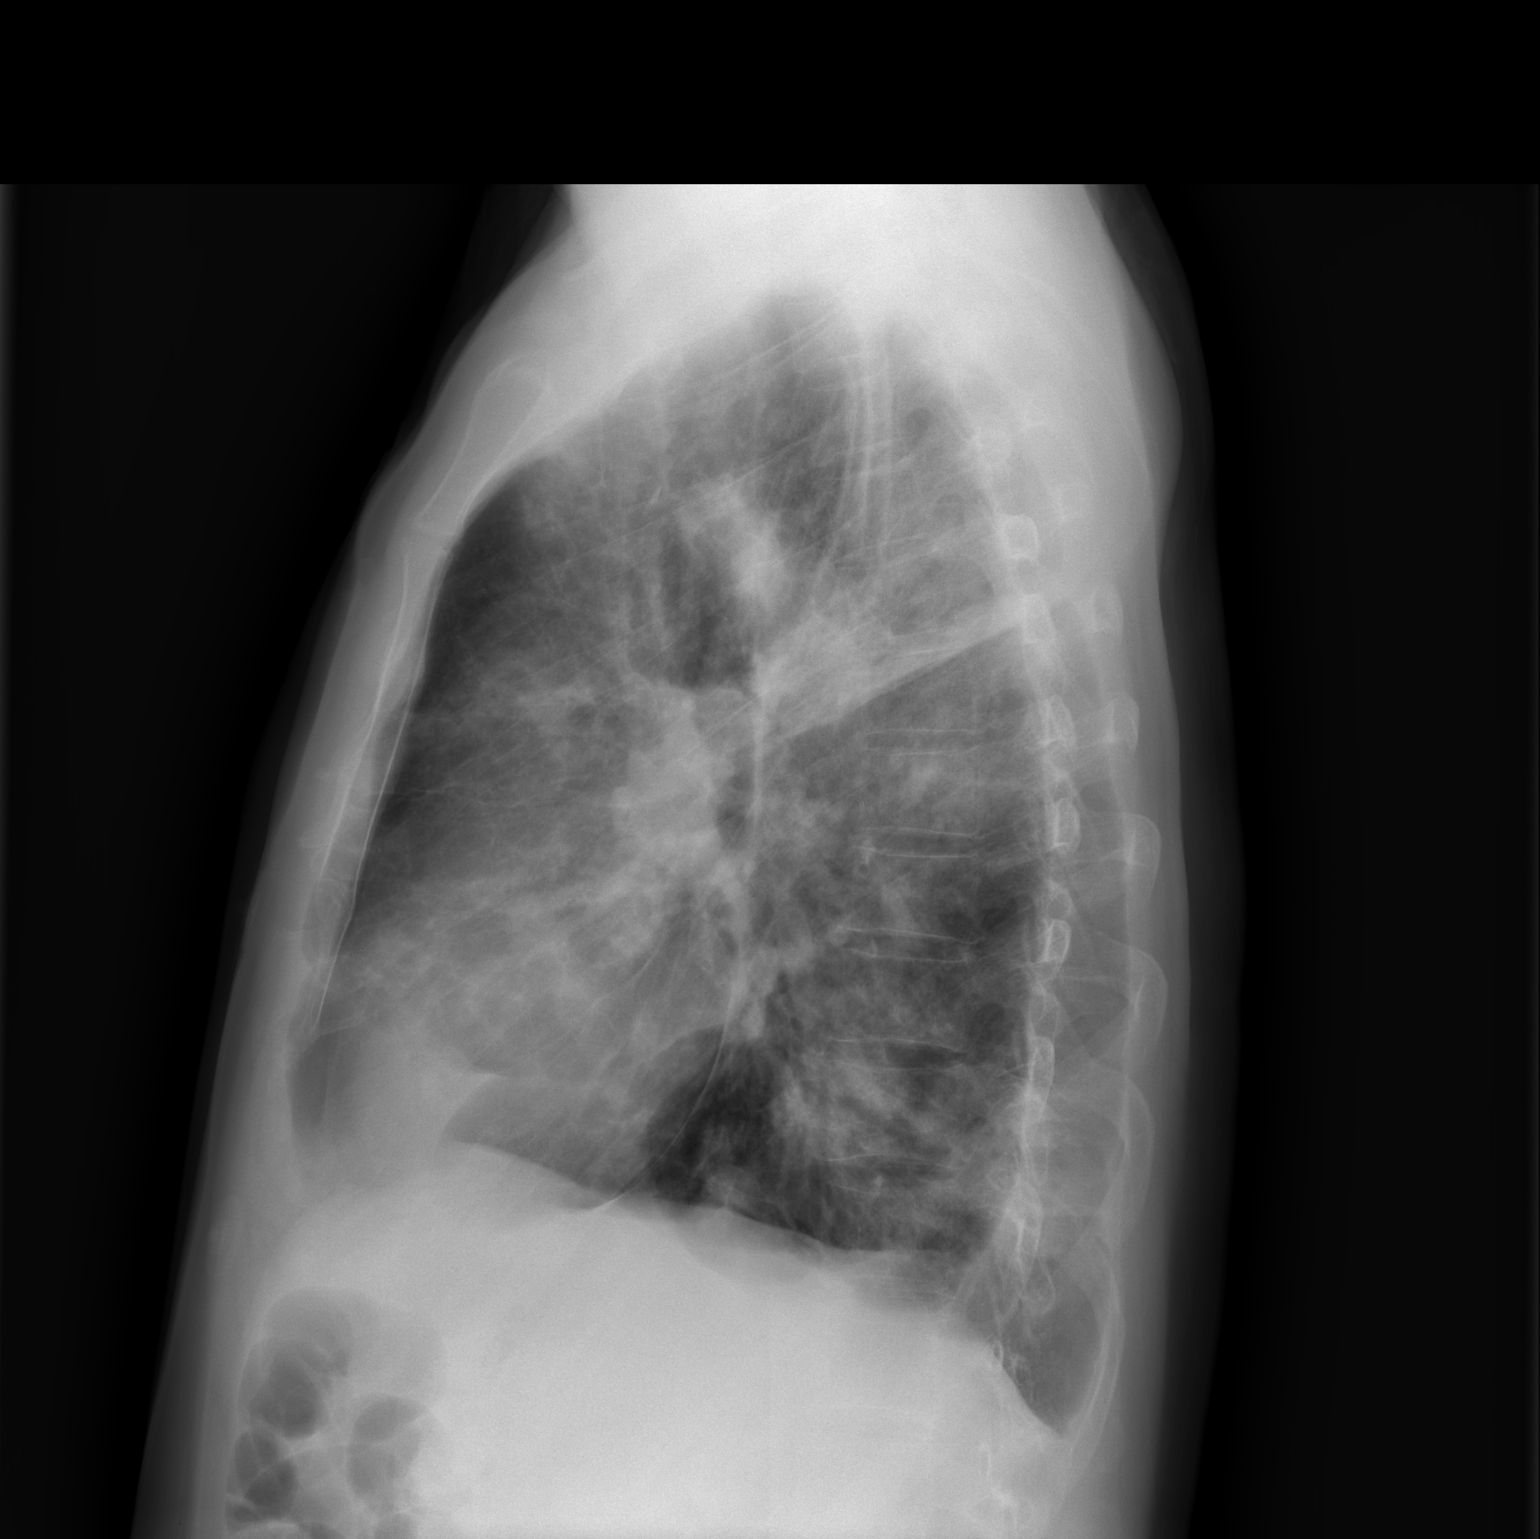

[2 of 2 positions shown; findings below may reference images not displayed]

FINDINGS: Persistent bilateral pulmonary nodules some of which are
cavitary.  No focal airspace consolidation, pulmonary edema or
pleural effusions.  Septic emboli would be a strong consideration.
Hiatal hernia is again demonstrated.
IMPRESSION: Persistent cavitary pulmonary nodules and peribronchial thickening.

## 2010-11-08 IMAGING — RF DG ESOPHAGUS
12 of 15 series · 19 of 24 positions shown · non-contrast
Comparison: None available.

CLINICAL DATA: Pneumonia.  Possible reflux.

ESOPHOGRAM/BARIUM SWALLOW
TECHNIQUE: Combined double contrast and single contrast
examination performed using effervescent crystals, thick barium
liquid, and thin barium liquid.
Fluoroscopy time:  3.4 minutes.

[Series 1: run · 3 of 8 slices shown (1 of 12)]
[im 1/8]
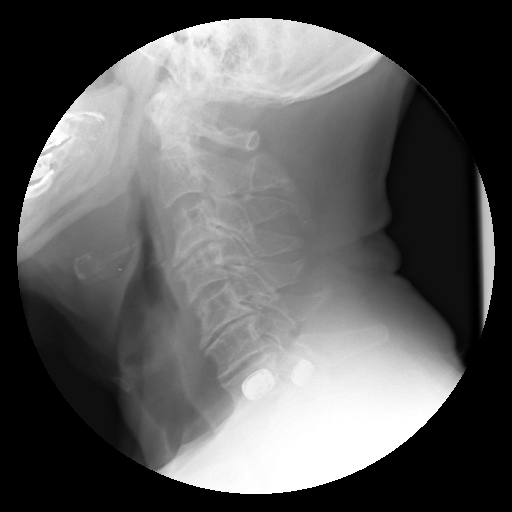
[im 3/8]
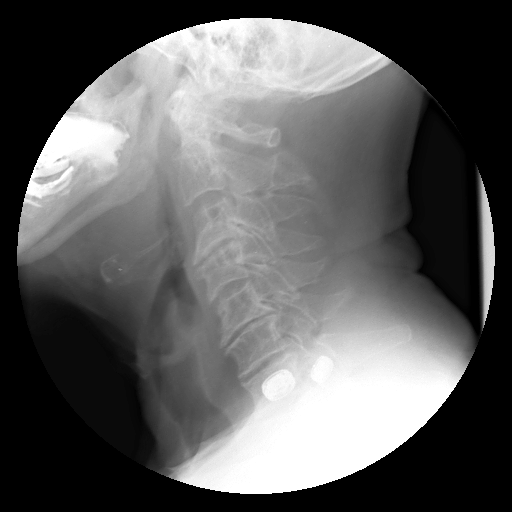
[im 8/8]
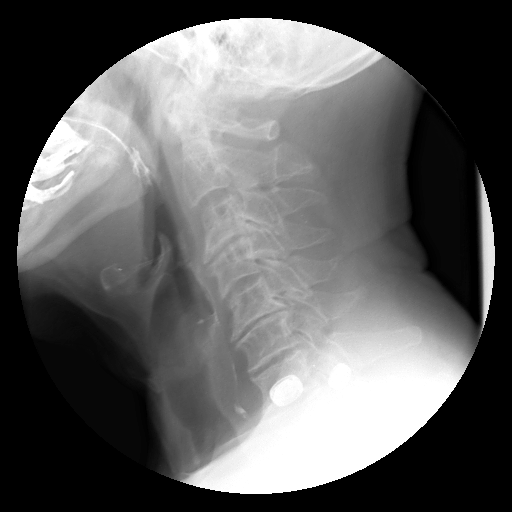

[Series 2: run · 3 of 5 slices shown (2 of 12)]
[im 1/5]
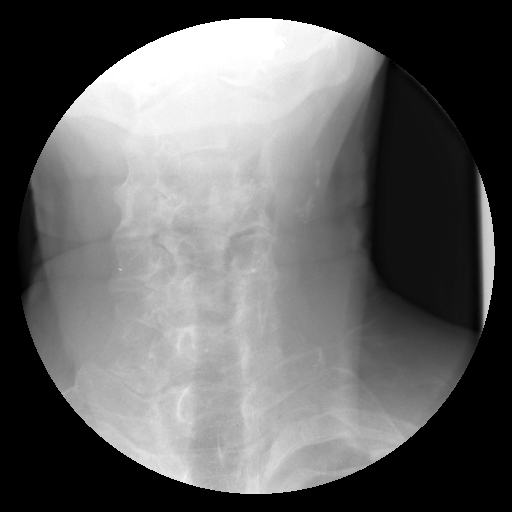
[im 3/5]
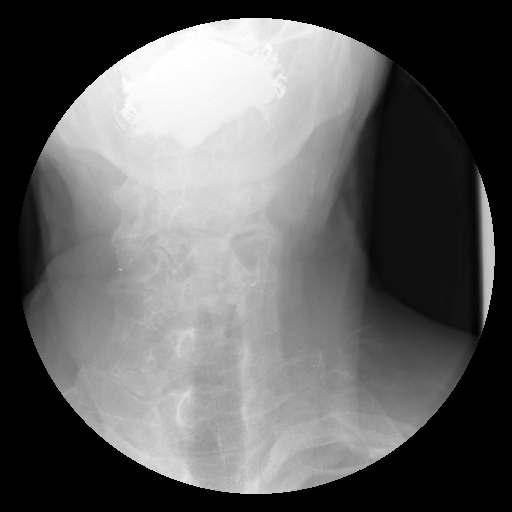
[im 5/5]
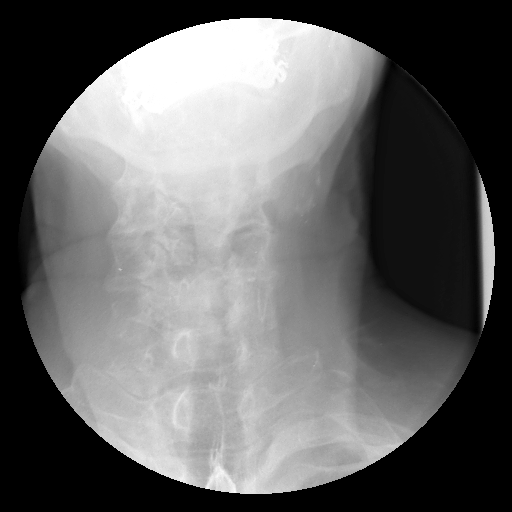

[Series 4: run · 2 of 3 slices shown (3 of 12)]
[im 1/3]
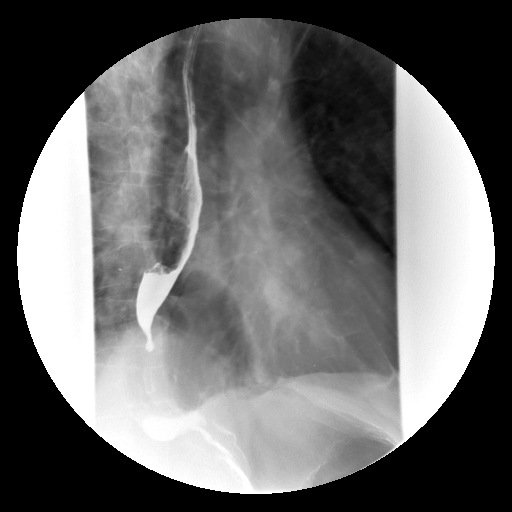
[im 3/3]
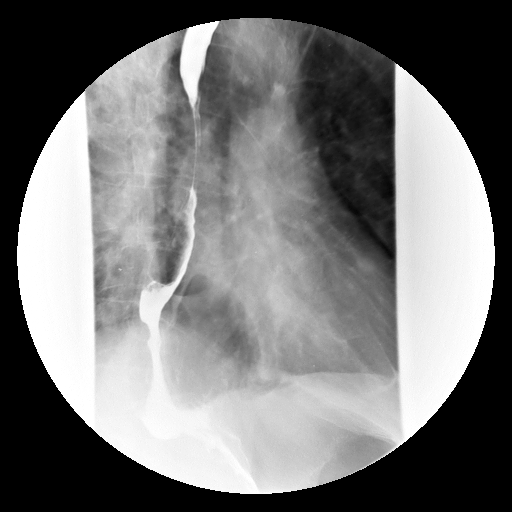

[Series 5: run · 1 of 3 slices shown (4 of 12)]
[im 1/3]
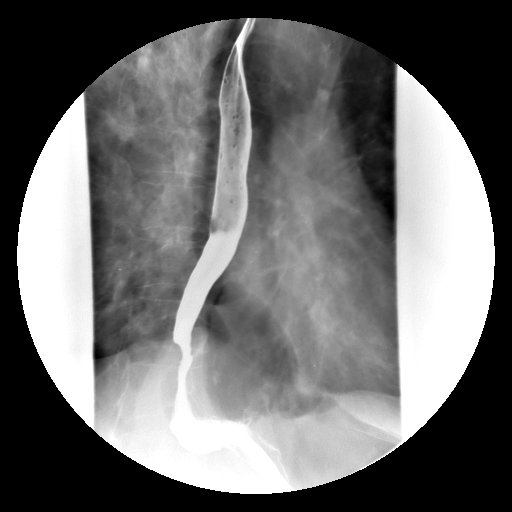

[Series 6: run · 1 of 2 slices shown (5 of 12)]
[im 1/2]
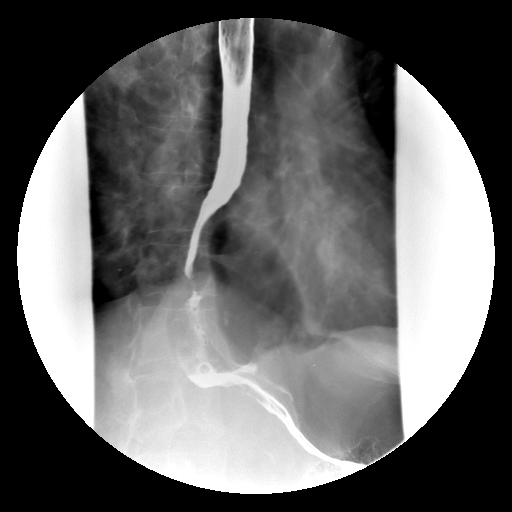

[Series 7: run · 2 of 3 slices shown (6 of 12)]
[im 1/3]
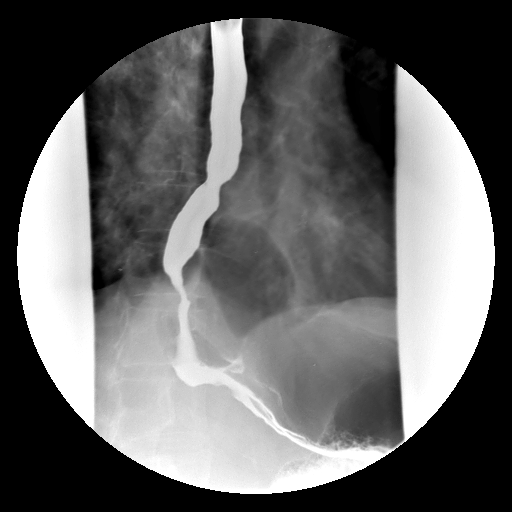
[im 3/3]
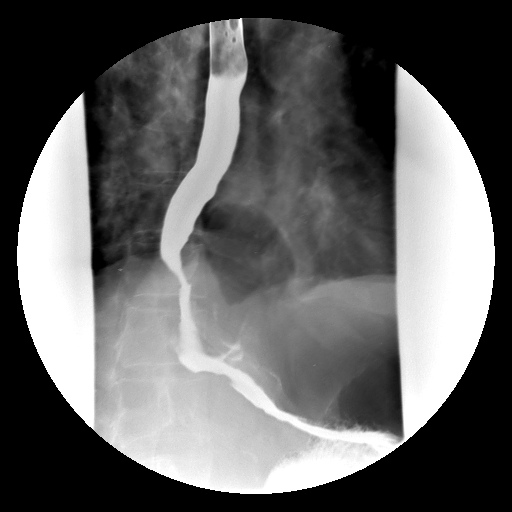

[Series 8: run · 1 of 2 slices shown (7 of 12)]
[im 1/2]
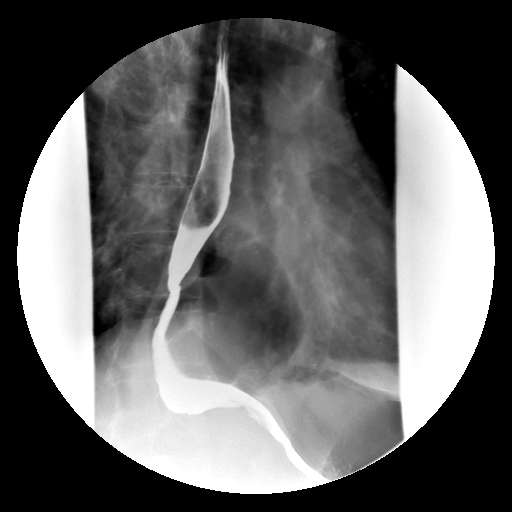

[Series 10: run · 1 of 2 slices shown (8 of 12)]
[im 1/2]
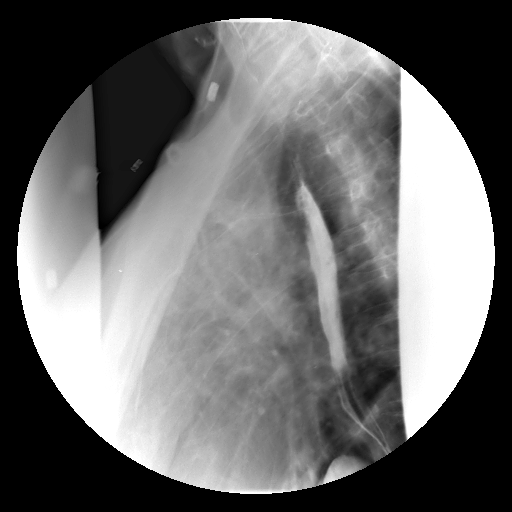

[Series 11: run · 2 of 4 slices shown (9 of 12)]
[im 1/4]
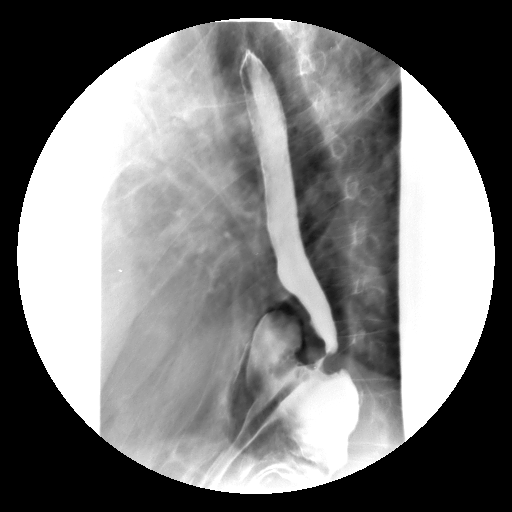
[im 4/4]
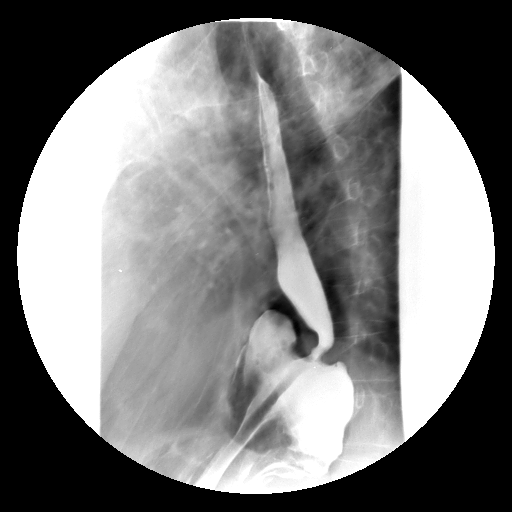

[Series 12: run · 1 of 1 slices shown (10 of 12)]
[im 1/1]
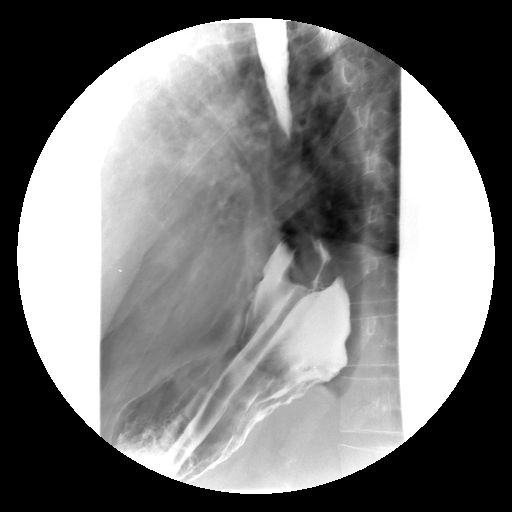

[Series 14: run · 1 of 1 slices shown (11 of 12)]
[im 1/1]
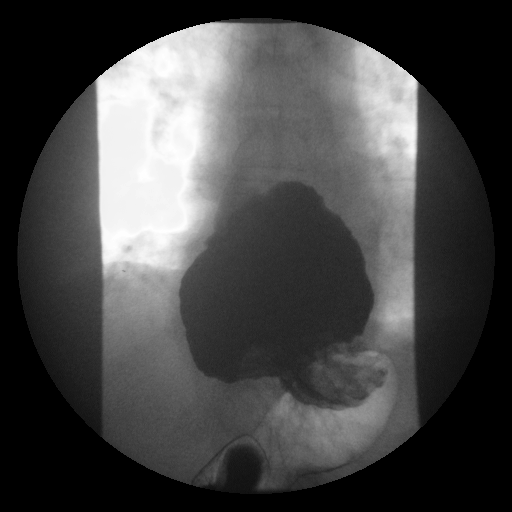

[Series 15: run · 1 of 1 slices shown (12 of 12)]
[im 1/1]
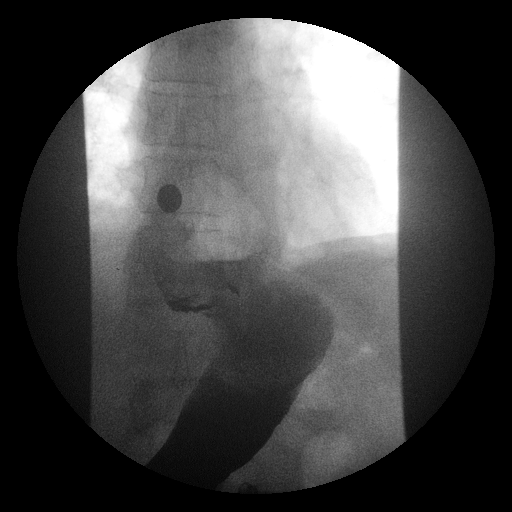

[19 of 24 positions shown; findings below may reference images not displayed]

FINDINGS: The pharynx is unremarkable.  Note is made of marked
multilevel cervical spondylosis.  The patient has a large hiatal
hernia.  There is narrowing of the esophagus just proximal to the
hiatal hernia.  13 mm barium tablet became lodged just above the
gastroesophageal junction.  Mild gastroesophageal reflux was
elicited on the examination.
IMPRESSION: 1.  Stricture of the distal esophagus just proximal to the
gastroesophageal junction cannot be fully characterized but is
likely due to inflammatory change.
2.  Hiatal hernia.
3.  Positive for gastroesophageal reflux.

## 2010-11-10 IMAGING — CR DG CHEST 1V PORT
1 series · 1 of 1 positions shown · non-contrast
Comparison: 03/29/2010

CLINICAL DATA: Bronchoscopy.  Right upper lobe biopsy.  Pneumonia.

PORTABLE CHEST - 1 VIEW

[series 1]
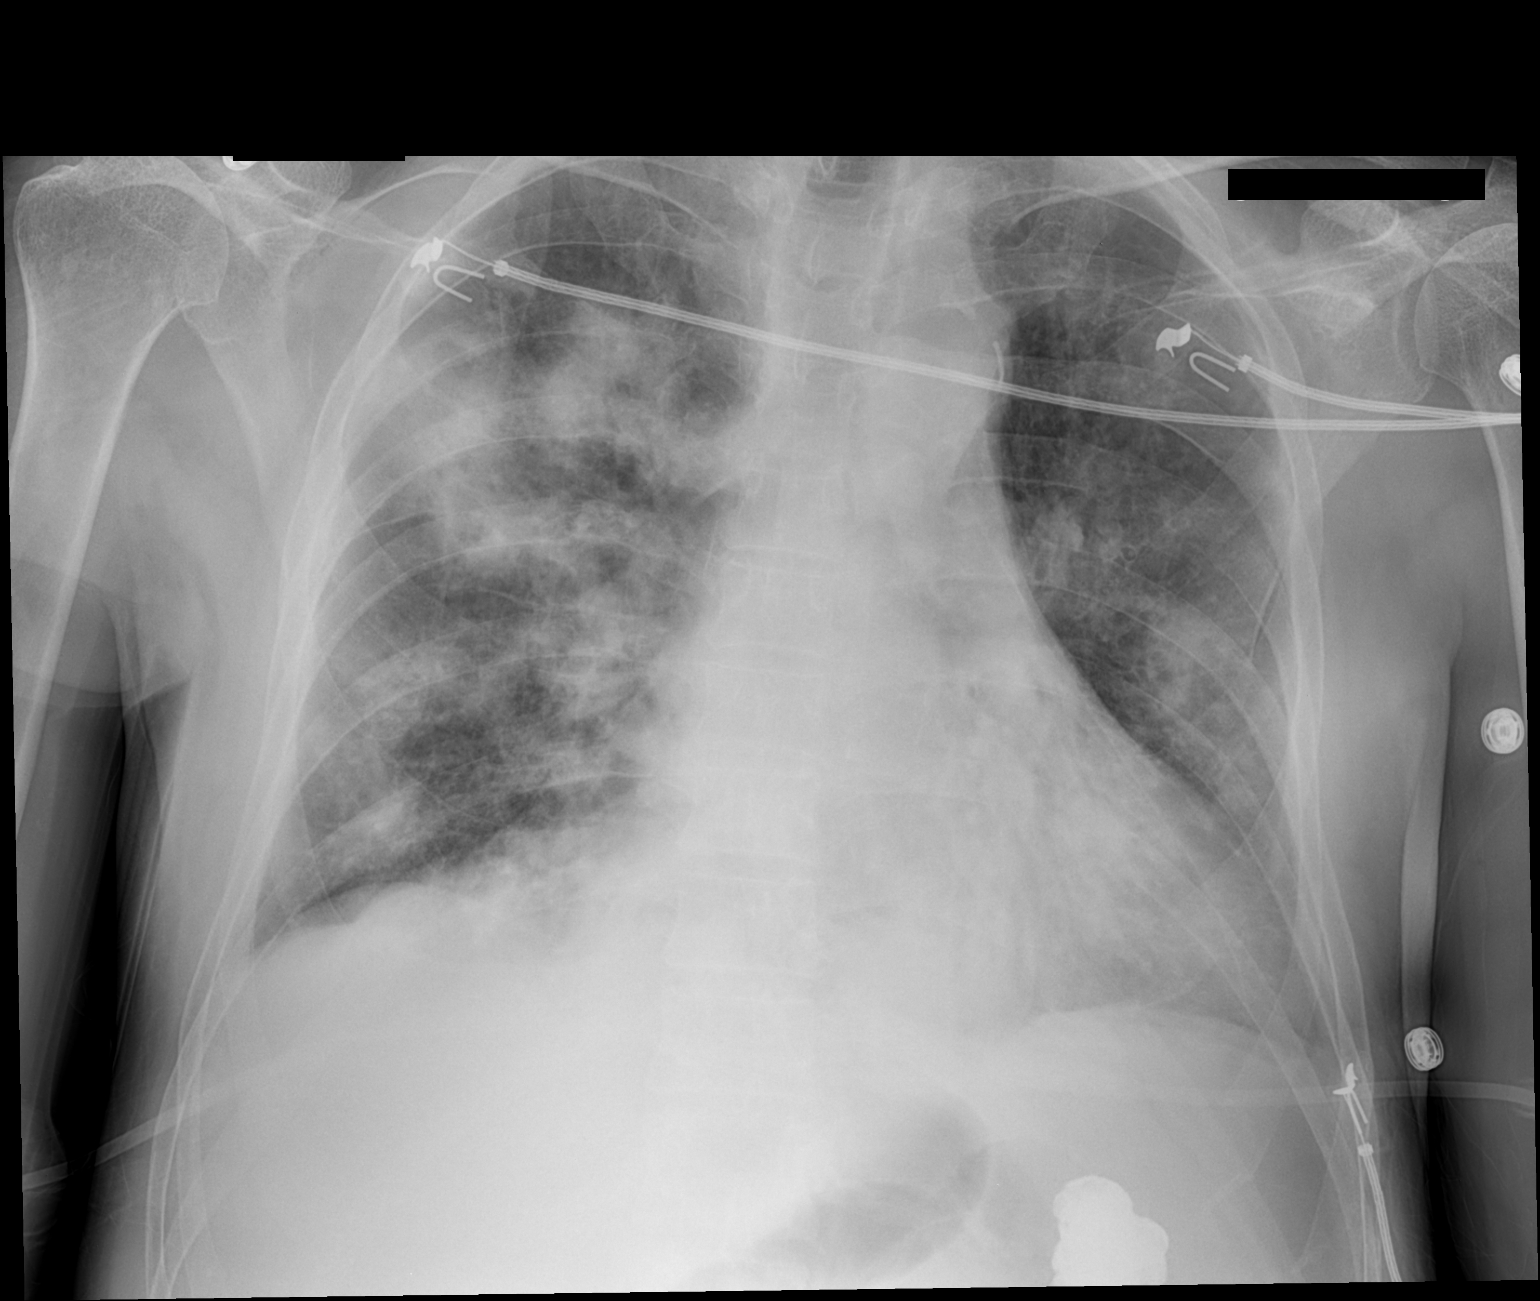

[1 of 1 positions shown; findings below may reference images not displayed]

FINDINGS: No pneumothorax or pneumomediastinum identified.

Low lung volumes are present.  There is mildly increased confluence
of the nodular airspace opacities indistinct nodular airspace
opacities in the right lung and left midlung.

Hiatal hernia noted.  There is some airspace opacity in the left
lower lobe.

Borderline cardiomegaly noted.
IMPRESSION: 1.  Mildly increased confluence of bilateral indistinct nodular
airspace opacities, some of which have been shown to be cavitary.

## 2010-11-11 ENCOUNTER — Encounter: Payer: Self-pay | Admitting: *Deleted

## 2010-11-12 IMAGING — CT CT CHEST W/O CM
2 of 3 series · 15 of 36 positions shown, 18 images · non-contrast
Comparison: Chest CT 03/26/2010.

CLINICAL DATA: Cough, congestion and shortness of breath.

CT CHEST WITHOUT CONTRAST
TECHNIQUE: Multidetector CT imaging of the chest was performed
following the standard protocol without IV contrast.

[Series 2: chest w/o st · axial · non-contrast · 0.69mm/px · z∈[-348,-63]mm · 12 of 67 slices shown, 15 images]
[im 5/67  mediastinal]
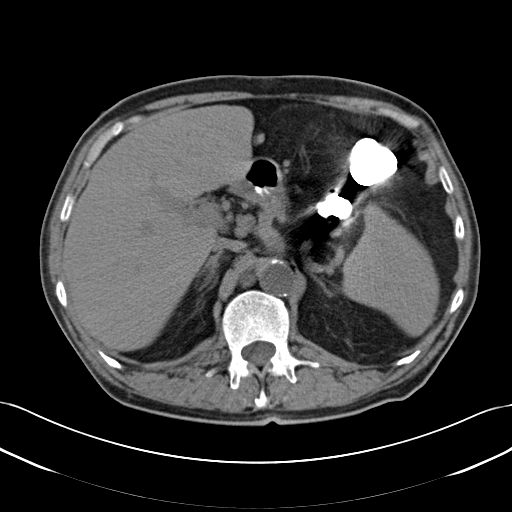
[im 5/67  lung]
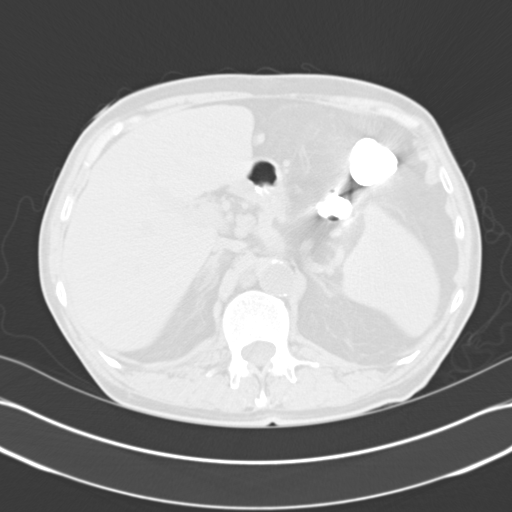
[im 10/67  lung]
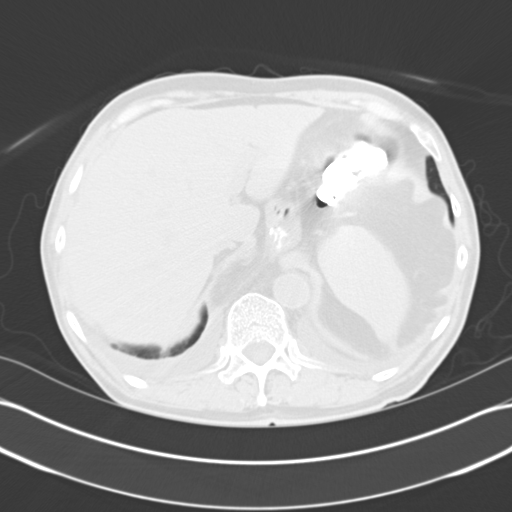
[im 15/67  lung]
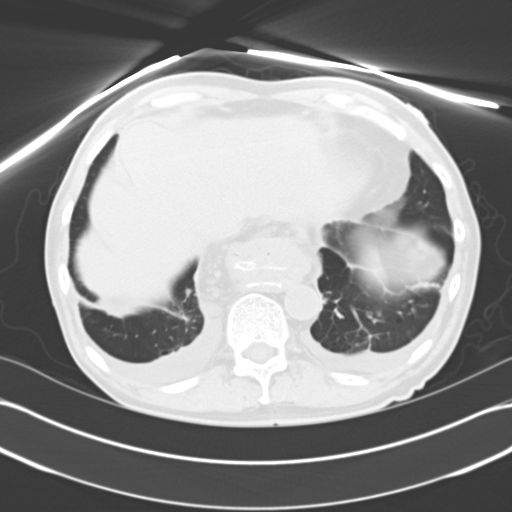
[im 20/67  lung]
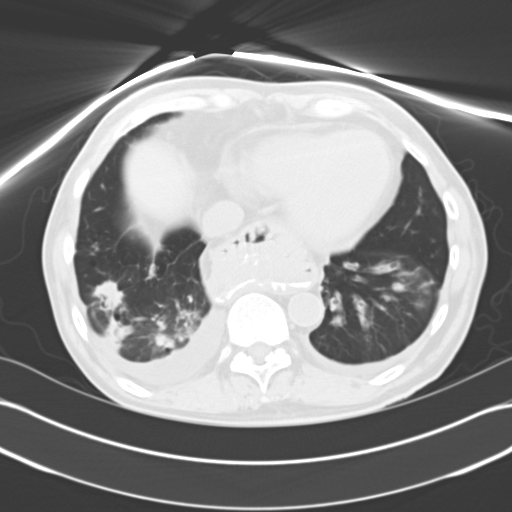
[im 25/67  mediastinal]
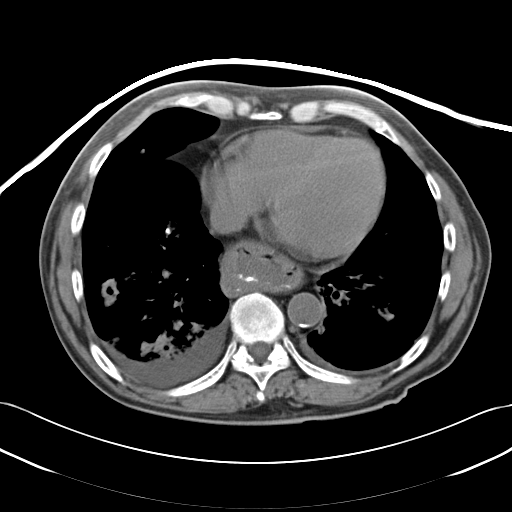
[im 25/67  lung]
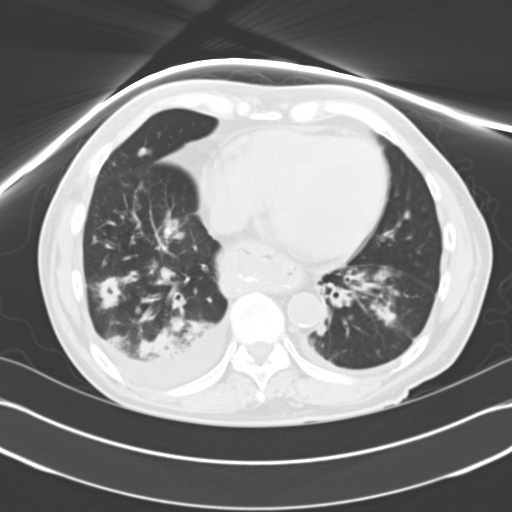
[im 30/67  lung]
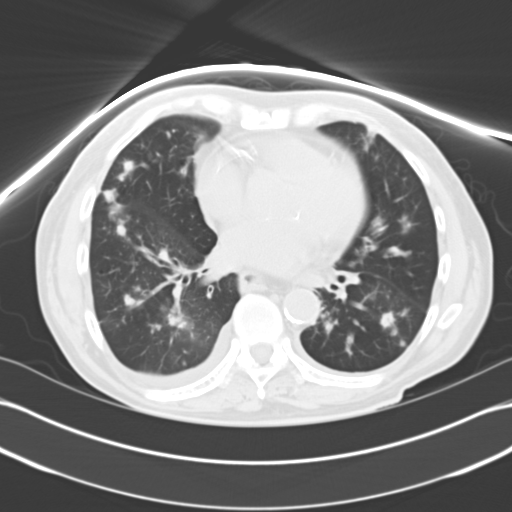
[im 37/67  lung]
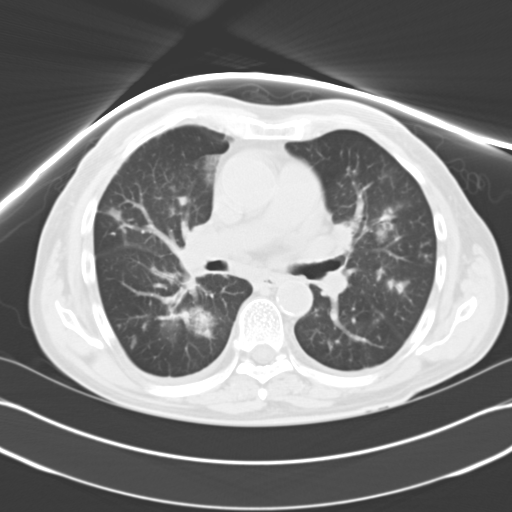
[im 42/67  lung]
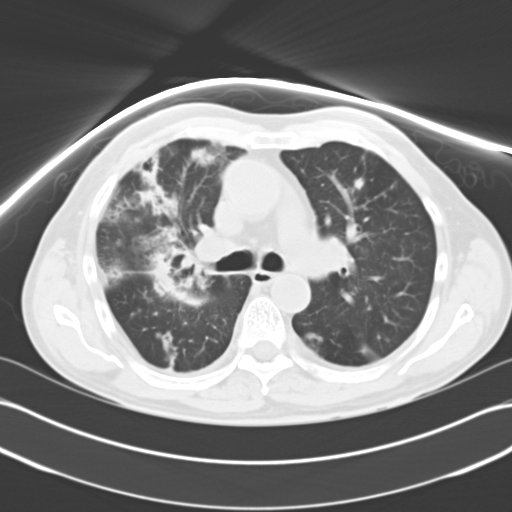
[im 47/67  mediastinal]
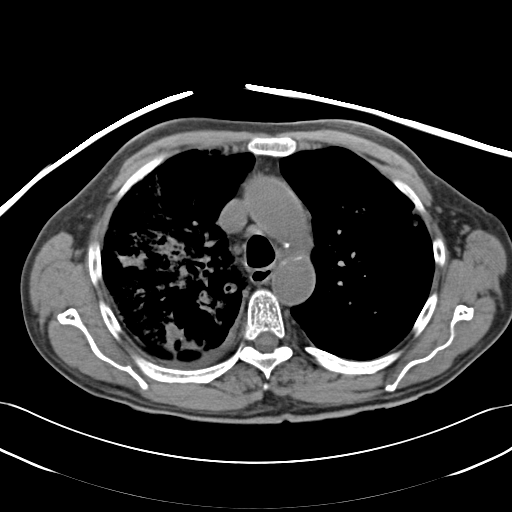
[im 47/67  lung]
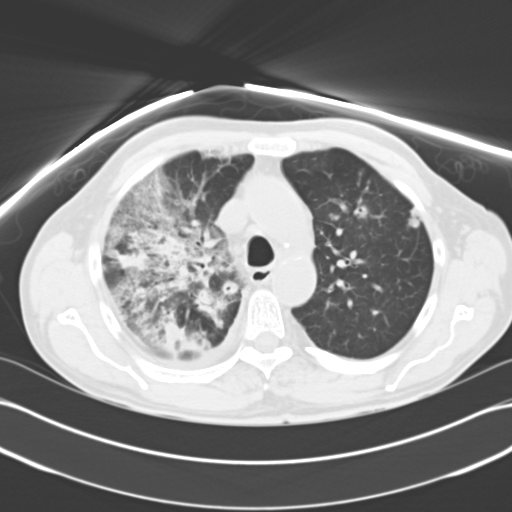
[im 52/67  lung]
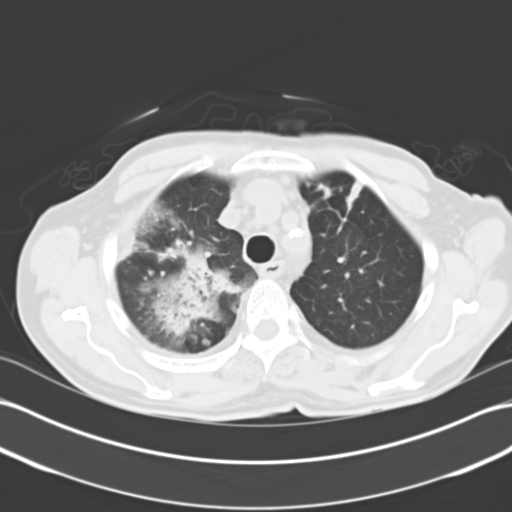
[im 57/67  lung]
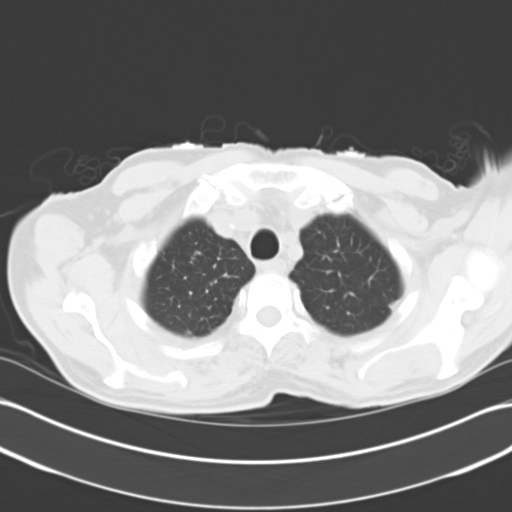
[im 62/67  lung]
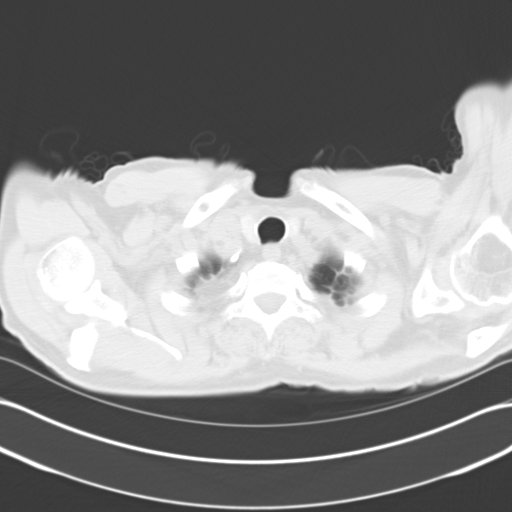

[Series 602: <mpr thick range> · coronal · 0.69mm/px · 3 of 79 slices shown]
[im 16/79  lung]
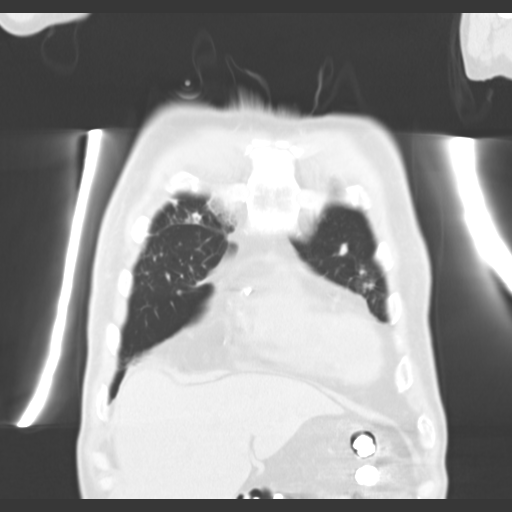
[im 32/79  lung]
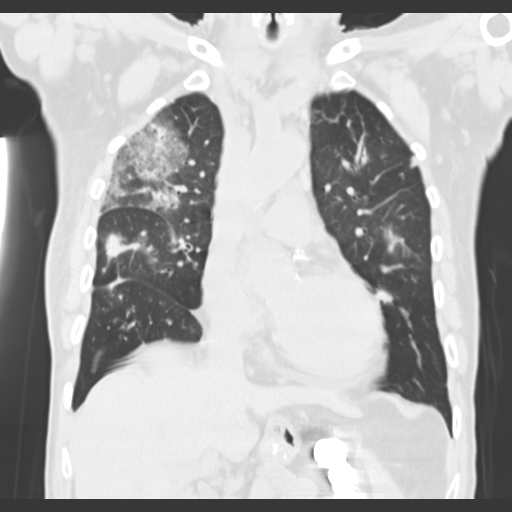
[im 47/79  lung]
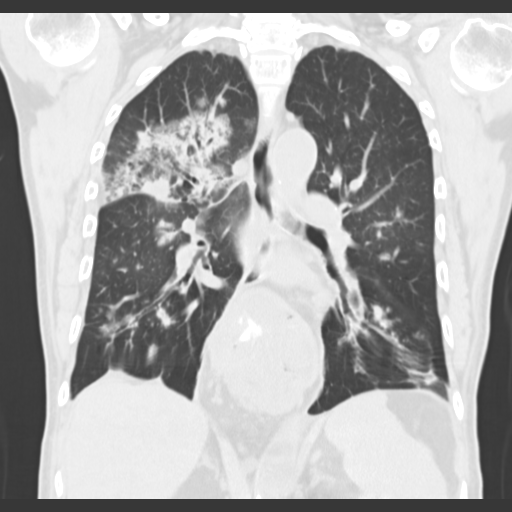

[15 of 36 positions shown; findings below may reference images not displayed]

FINDINGS: The chest wall is stable.  No supraclavicular or axillary
adenopathy.

The heart is normal in size.  No pericardial effusion.  Coronary
artery calcifications are noted.  There are persistent borderline
enlarged mediastinal and hilar lymph nodes.  The aorta is normal in
caliber.

Examination of the lung parenchyma demonstrates progression of
right upper lobe airspace process into a confluent airspace opacity
consistent with pneumonia.  There is a slightly larger cavitary
lesion in the right upper lobe.  Persistent numerous ill-defined
airspace opacities, cavitary nodules, non cavitary nodules and
extensive peribronchial thickening in both lungs.  There are new
small bilateral pleural effusions.

Differential considerations include septic emboli with organizing
right upper lobe pneumonia, bronchiolitis obliterans  with
organizing pneumonia, atypical infection such as eosinophilic
pneumonia or fungal disease.
IMPRESSION: 1.  Persistent bilateral cavitary pulmonary nodules, non cavitary
nodules and extensive peribronchial thickening.
2.  Progressive right upper lobe pneumonia.
3.  New bilateral pleural effusions.
4.  Stable mediastinal and hilar lymph nodes.
5.  See above discussion for differential considerations.

## 2010-11-12 IMAGING — CR DG CHEST 2V
2 series · 2 of 2 positions shown · non-contrast
Comparison: 03/31/2010

CLINICAL DATA: Follow up pneumonia

CHEST - 2 VIEW

[w chest pa]
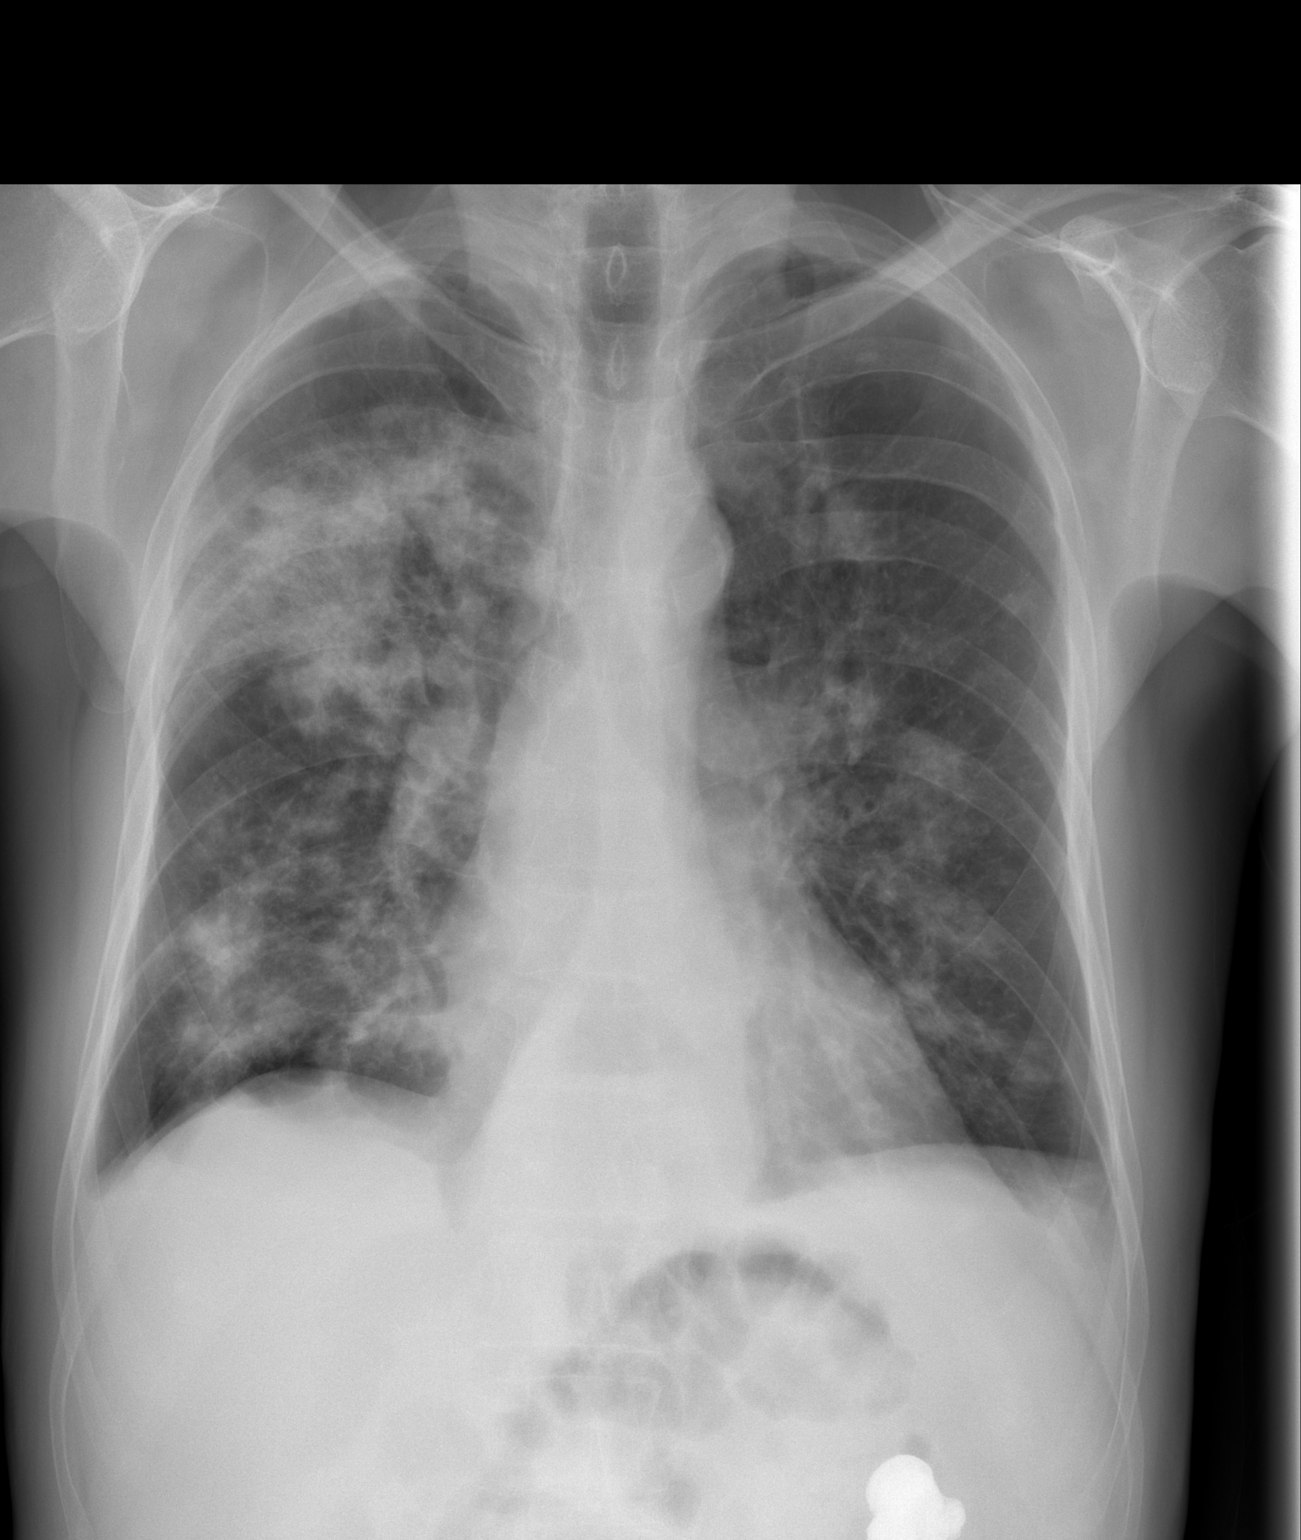

[w chest lat]
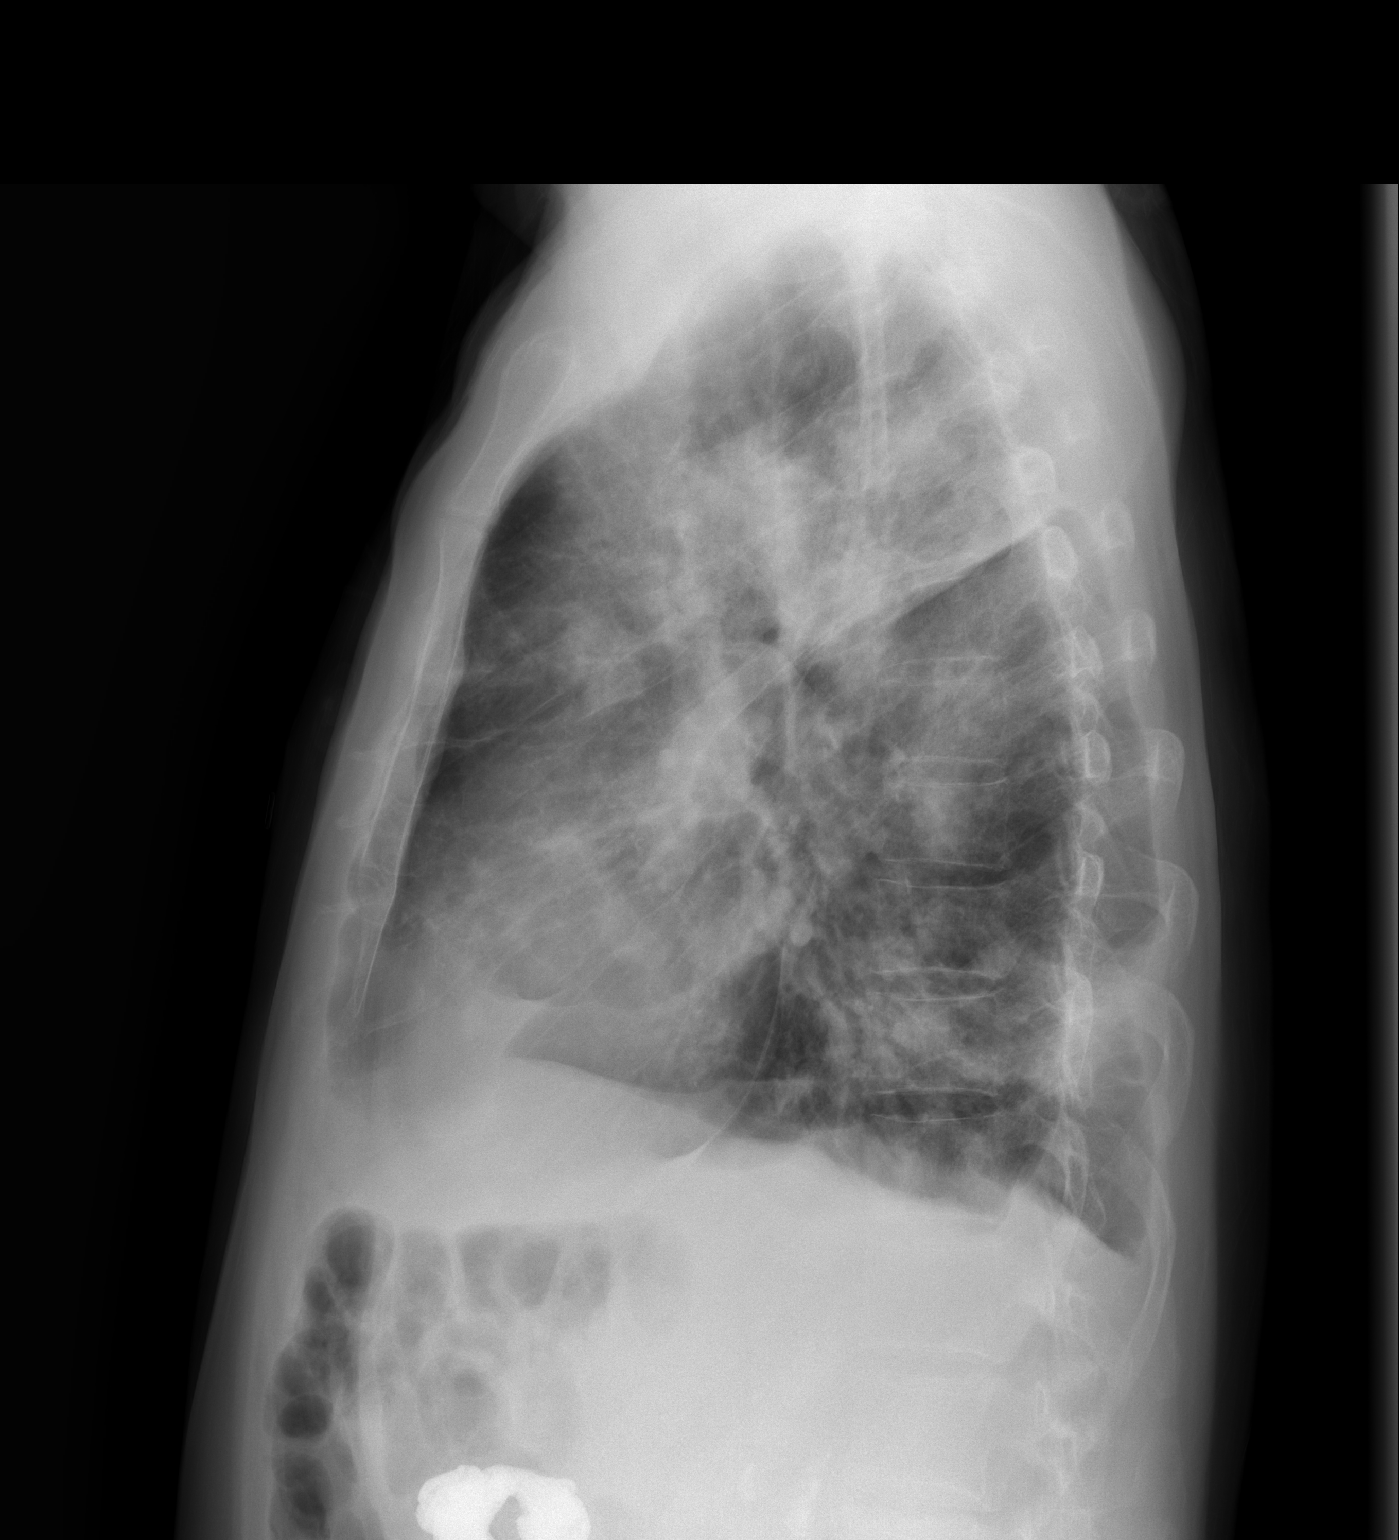

[2 of 2 positions shown; findings below may reference images not displayed]

FINDINGS: Heart size is normal.

There are small bilateral pleural effusions.

No interstitial edema identified.

Bilateral airspace disease is again identified.

This is unchanged from previous exam.
IMPRESSION: 1.  No change in bilateral airspace opacities.
2.  Small bilateral pleural effusions.

## 2010-11-16 ENCOUNTER — Encounter: Payer: Self-pay | Admitting: Internal Medicine

## 2010-11-16 ENCOUNTER — Other Ambulatory Visit (INDEPENDENT_AMBULATORY_CARE_PROVIDER_SITE_OTHER): Payer: Medicare Other

## 2010-11-16 ENCOUNTER — Ambulatory Visit (INDEPENDENT_AMBULATORY_CARE_PROVIDER_SITE_OTHER)
Admission: RE | Admit: 2010-11-16 | Discharge: 2010-11-16 | Disposition: A | Discharge status: Home / Self Care | Payer: Medicare Other | Source: Ambulatory Visit | Attending: Internal Medicine | Admitting: Internal Medicine

## 2010-11-16 ENCOUNTER — Ambulatory Visit (INDEPENDENT_AMBULATORY_CARE_PROVIDER_SITE_OTHER): Payer: Medicare Other | Admitting: Internal Medicine

## 2010-11-16 VITALS — BP 114/60 | HR 74 | Temp 98.4°F | Ht 67.0 in | Wt 122.2 lb

## 2010-11-16 DIAGNOSIS — J930 Spontaneous tension pneumothorax: Secondary | ICD-10-CM

## 2010-11-16 DIAGNOSIS — M3130 Wegener's granulomatosis without renal involvement: Secondary | ICD-10-CM

## 2010-11-16 DIAGNOSIS — D649 Anemia, unspecified: Secondary | ICD-10-CM

## 2010-11-16 LAB — CBC WITH DIFFERENTIAL/PLATELET
Basophils Relative: 0.4 % (ref 0.0–3.0)
Eosinophils Relative: 1.5 % (ref 0.0–5.0)
HCT: 29.5 % — ABNORMAL LOW (ref 39.0–52.0)
Hemoglobin: 10.2 g/dL — ABNORMAL LOW (ref 13.0–17.0)
Lymphocytes Relative: 6.3 % — ABNORMAL LOW (ref 12.0–46.0)
Lymphs Abs: 0.4 10*3/uL — ABNORMAL LOW (ref 0.7–4.0)
Monocytes Relative: 9.5 % (ref 3.0–12.0)
Neutro Abs: 4.8 10*3/uL (ref 1.4–7.7)
RBC: 2.72 Mil/uL — ABNORMAL LOW (ref 4.22–5.81)
RDW: 14.4 % (ref 11.5–14.6)
WBC: 5.8 10*3/uL (ref 4.5–10.5)

## 2010-11-16 LAB — BASIC METABOLIC PANEL
Calcium: 8.6 mg/dL (ref 8.4–10.5)
GFR: 58.13 mL/min — ABNORMAL LOW (ref >60.00)
Glucose, Bld: 79 mg/dL (ref 70–99)
Potassium: 4.9 mEq/L (ref 3.5–5.1)
Sodium: 141 mEq/L (ref 135–145)

## 2010-11-16 NOTE — Assessment & Plan Note (Signed)
I had an extended discussion with the patient today lasting 15 to 20 minutes of a 25 minute visit on the following issues:     Each maintenance medication was reviewed in detail including most importantly the difference between maintenance and as needed and under what circumstances the prns are to be used.  Please see instructions for details which were reviewed in writing and the patient given a copy.  This was done in the context of a medication calendar review which provided the patient with a user-friendly unambiguous mechanism for medication administration and reconciliation and provides an action plan for all active problems. It is critical that this be shown to every doctor  for modification during the office visit if necessary so the patient can use it as a working document.   Will now try off all prednisone and follow on just ctx

## 2010-11-16 NOTE — Patient Instructions (Addendum)
Try off prednisone completely.  If you feel a lot worse over the next  week(worse breathing/ coughing/ nausea or loss of appetite) resume prednisone 5 mg one half daily until better then return back one half every other day  Late add:  Needs B12 and RBC Folate next ov  Please schedule a follow up office visit in 4 weeks, sooner if needed

## 2010-11-16 NOTE — Assessment & Plan Note (Signed)
Slt worse, slt more macrocytic, needs RBC Folate and B12 next ov

## 2010-11-16 NOTE — Progress Notes (Signed)
Subjective:    Patient ID: Richard [name], male    DOB: 1936/02/24, 75 y.o.   MRN: [id number]  HPI Primary Provider/Referring Provider: [name]   :  74 yowm never smoker with dx of  Longstanding bronchiectasis then  dx with WG 03/2010  > requested establish with [facility].  July 08, 2009- Bronchiectasis, chronic bronchits, allergic rhinitis  Had flu vax and has had at least 2 pneumovax.     Admit [facility] dx WG by pos c anca 03/2010, supportive tbbx but not vats   Admit [name] dx spont L Ptx 9/08/15/09 requiring Chest tube but no surgery    05/24/10--NP ov/  med reivew. Since last visit. has been hospitalized 05/12/2010-- 10/10/2011for Right pneumothorax., with underlying Wegener granulomatosis. and . Bronchiectasis. Pt was found to have Right Pneumo. Chest tube was placed. with improvement and removed 10/7. Xray on 10/10 w/ resolved pnuemo. He was continued on cytoxan and bactrim for PCP prophylaxis. rec. Prednisone 20 mg reduce to one half daily  Aspirin 81 mg one daily with breakfast but stop for any bleeding  See Patient Care Coordinator before leaving for cardiology appt > saw Dr [name], ? LHC needed   July 21, 2010 ov sob better no longer needing any 02 at all during day, no hemoptysis , producing occ yellow mucus - reports intermittent noct bilateral shoulder pain better with streching, moving, massage. no assoc nausea or sweats. recd try pred 5 mg daily   August 24, 2010 ov cough w/ yellow phlem, breathing is better, using flutter and med calendar consistently. R chest pain with deep breathing no change since R chest tube removed in 05/2010.   October 13, 2010 --Presents for an acute office visit. Complains of productive cough with yellow mucus, sore throat, rattling in chest, low grade temp x3days. Currently on pred 1/2 every other day . OTC not helping.  rec continue qod pred, rx with augmentin x 10 days  11/16/2010 ov all smiles, no change on days of prednisone, minimal discolored  sputum.    Pt denies any significant sore throat, dysphagia, itching, sneezing,  nasal congestion or excess/ purulent secretions,  fever, chills, sweats, unintended wt loss, pleuritic or exertional cp, hempoptysis, orthopnea pnd or leg swelling.    Also denies any obvious fluctuation of symptoms with weather or environmental changes or other aggravating or alleviating factors.       PMHx: Allergic rhinitis  GERD- hiatal hernia with reflux  - Repeat Barium Swallow c/w stricture and reflux 03/29/10  Bronchiectasis  - See CT chest 12/14/2009 > progressive bilateral R> L cavities 03/26/10 > clinical dx Wegener's 04/07/10  - PFTs 02/17/10 FEV1 1.52 (58%) ratio 42 and 15% better after B2, DLC0 118%  WEGENER'S GRANULOMATOSIS  - dX BY pos C anca/ supportive TBBX 04/10/10 > CYTOXAN initiated  - Prednisone tapered off March 2012  PAF/ abn echo during [facility] admit 03/30/10  - ECH0 Aug 23 2011LVH with low nl ef, mild mr, ok R function, peak trop .59  - Not anticoagulated due to WG with Hemoptysis  L PTX 04/25/10  R PTX 05/12/10 > final f/u by [name] 06/01/10  benign hypertrophy of the prostate  Complex med regimen--Meds reviewed with pt education and computerized med calendar May 25, 2010   Family History:  Mother- deceased age 49;car wreck  Father- deceased age 36; breathing problems;asthma  Brother- living age 24; heart CAD (s/p CABG)  Sibling- deceased age 69; heart (s/p CABG)   Social History:  Retired  Non smoker  Married with 3 children  Risk Factors:  Smoking Status: never (05/27/2010)             Review of Systems     Objective:   Physical Exam  amb thin pleasant wm nad Baseline wt =130  Wt 110 May 10, 2010 > 120 August 24, 2010 > 125 October 05, 2010 >122 10/13/10 > 122 11/16/2010  chronically amb, up on table without assistance.  SKIN: no rash, lesions  NODES: no lymphadenopathy  HEENT: [place]/AT, EOM- WNL, Conjuctivae- clear, PERRLA, TM-WNL, Nose- clear, Throat- clear and  wnl, Mallampati III  NECK: Supple w/ fair ROM, JVD- none, normal carotid impulses w/o bruits Thyroid-  CHEST: Mininimal exp rhonchi bilaterally  HEART: RRR, no m/g/r heard  ABDOMEN: Soft and nl;     CXR 11/16/2010    IMPRESSION:  1. Chronic changes, primarily affecting the right lung.  2. Hiatal hernia.  ESR  21 >  31 all other labs no sign change.   Assessment & Plan:

## 2010-11-17 NOTE — Progress Notes (Signed)
Quick Note:  Spoke with pt and notified of results per Dr. [name]. Pt verbalized understanding and denied any questions.  ______ 

## 2010-11-19 IMAGING — CR DG CHEST 1V PORT
1 series · 1 of 1 positions shown · non-contrast
Comparison: 04/08/2010.

CLINICAL DATA: Pneumonia.

PORTABLE CHEST - 1 VIEW

[series [date]]
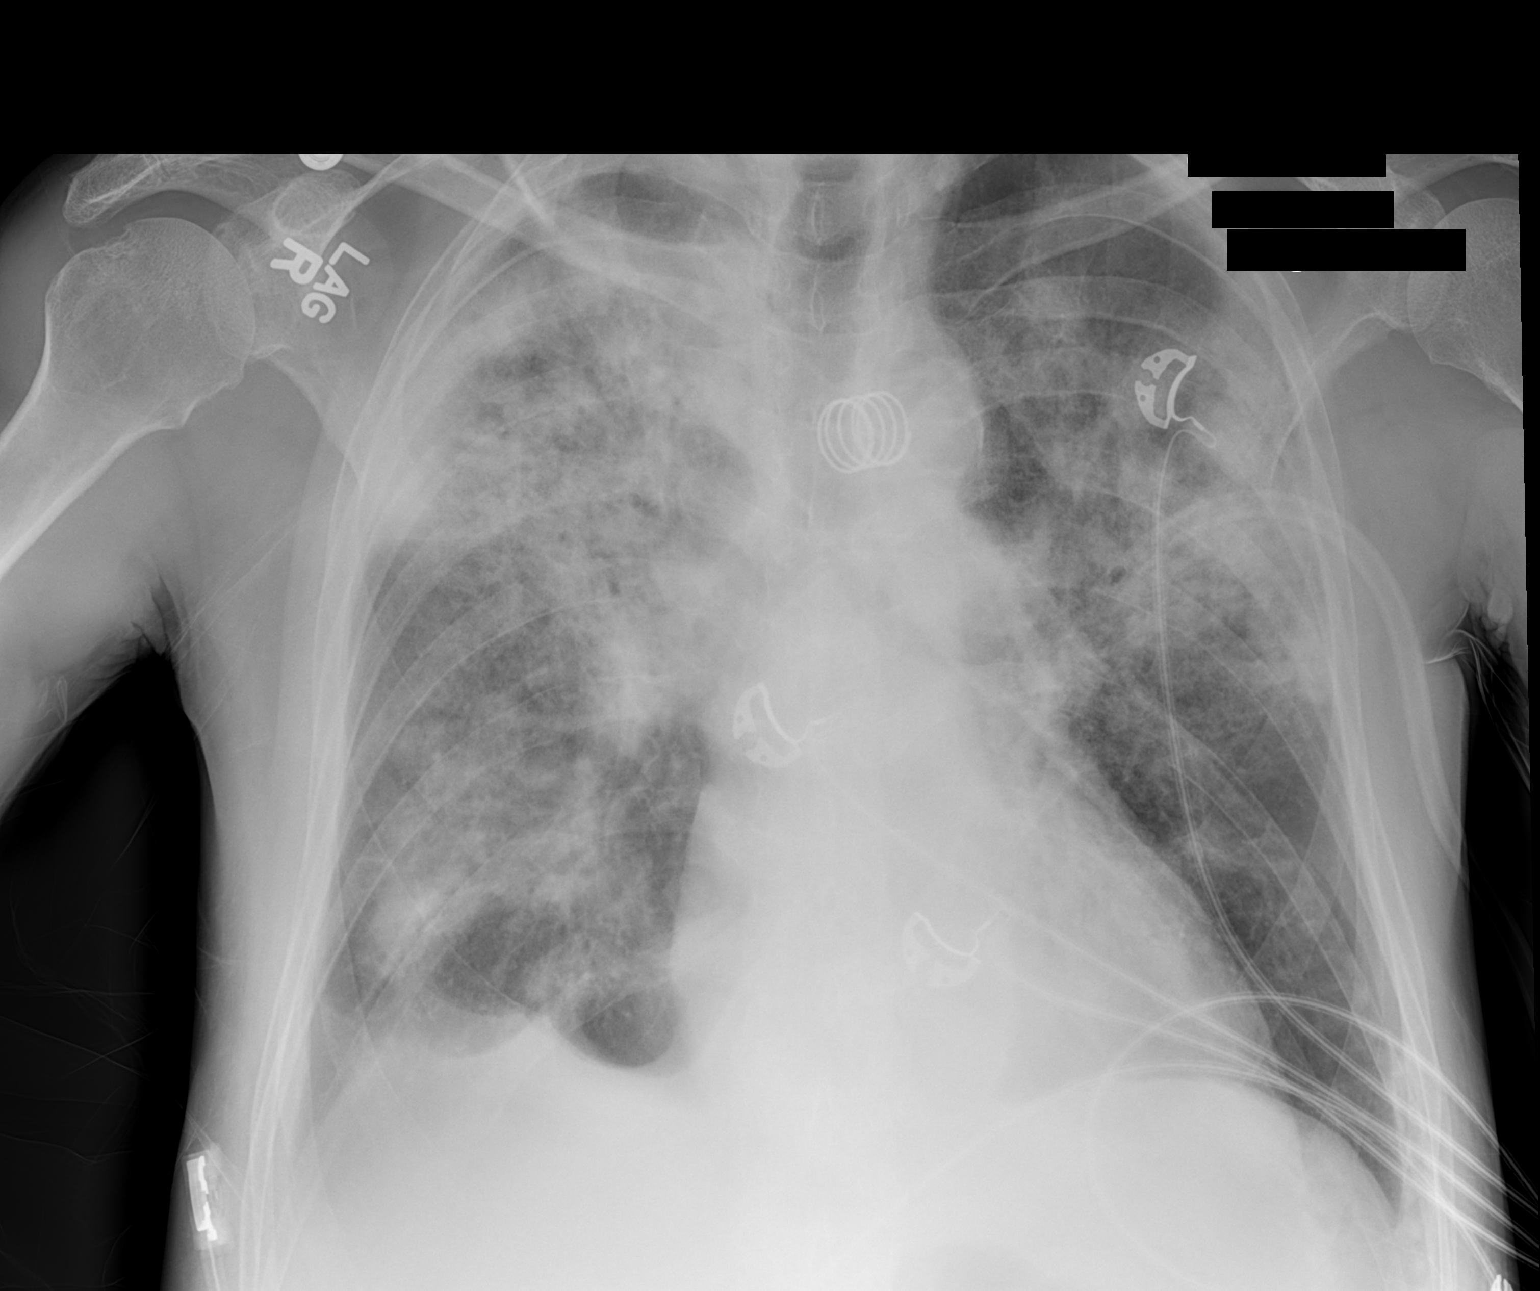

[1 of 1 positions shown; findings below may reference images not displayed]

FINDINGS: Further progression of bilateral multi focal airspace
disease most pronounced in the right upper lobe.  Tenting of the
right hemidiaphragm with probable small right pleural effusion.
Cardiomediastinal contours appear unchanged.
IMPRESSION: Progressive airspace disease compatible with multifocal pneumonia.

## 2010-11-20 IMAGING — CR DG CHEST 1V PORT
1 series · 1 of 1 positions shown · non-contrast
Comparison: [DATE]

CLINICAL DATA: Pneumonia

PORTABLE CHEST - 1 VIEW

[view not recorded]
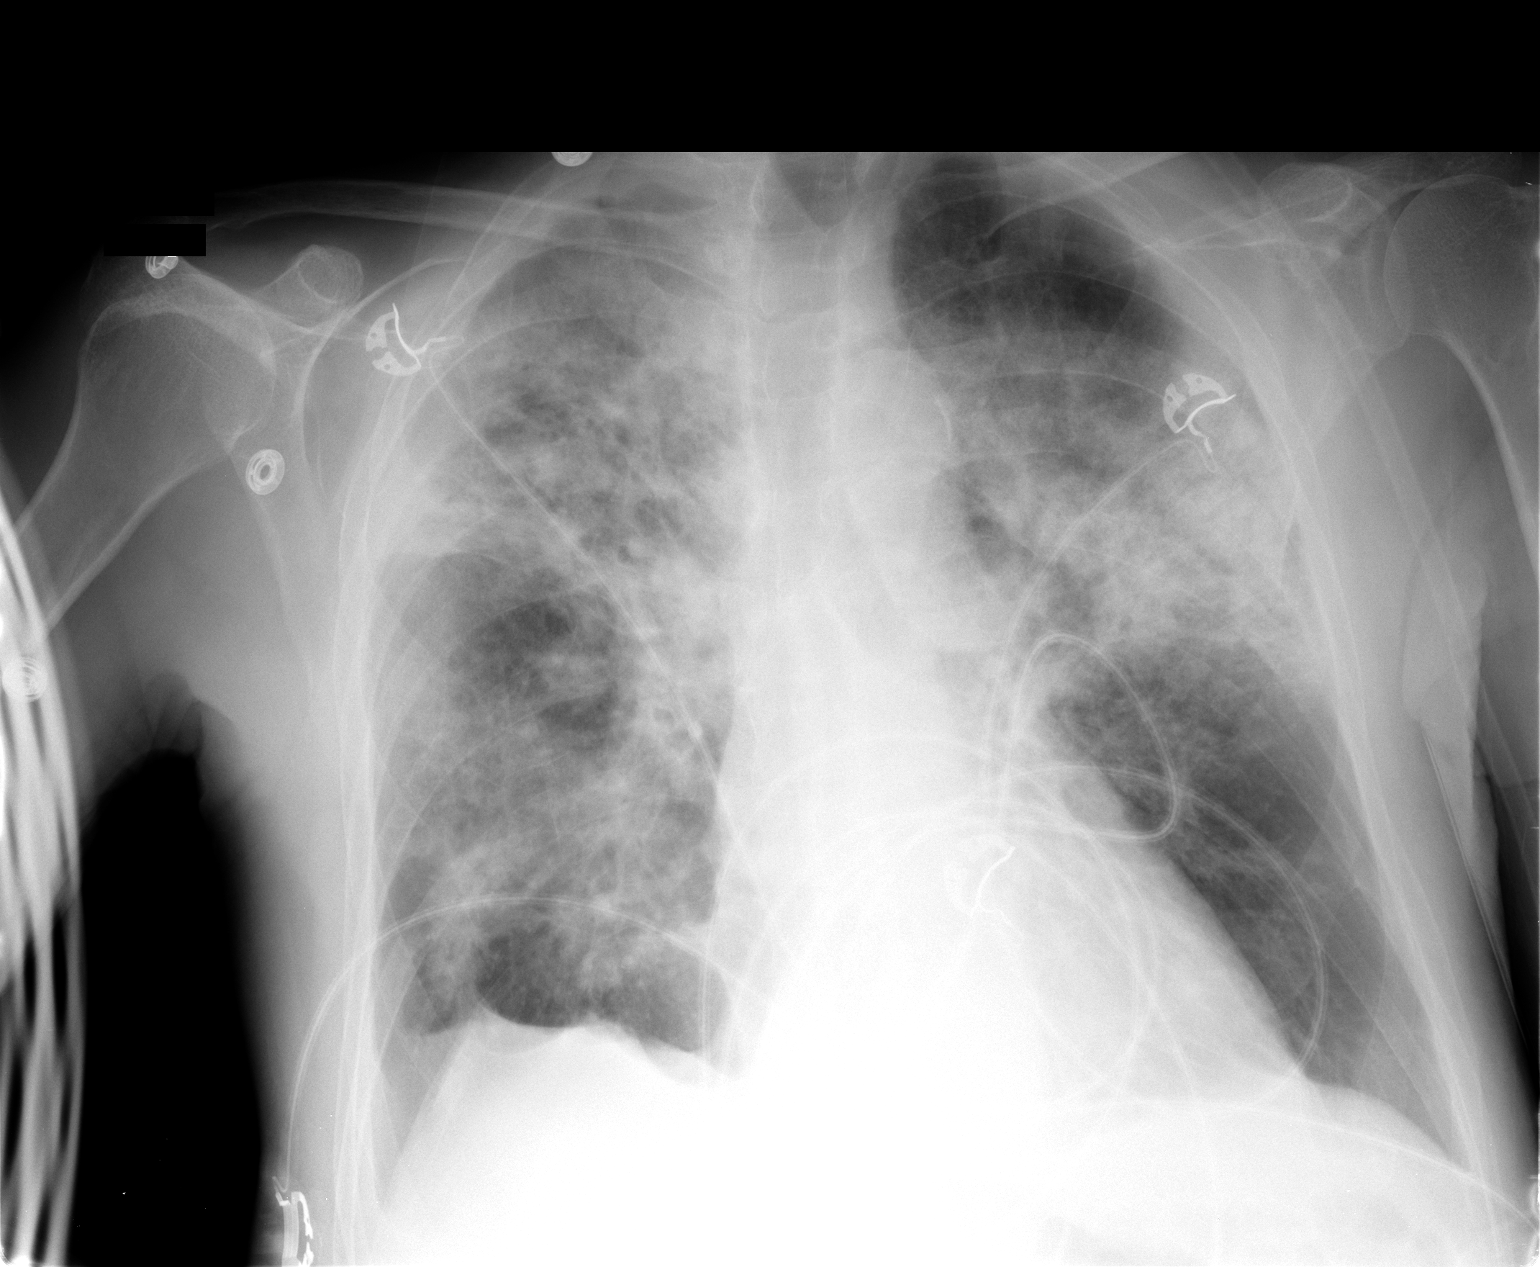

[1 of 1 positions shown; findings below may reference images not displayed]

FINDINGS: Patchy multi focal consolidative airspace disease
throughout both upper lobes and the right lower lobe consistent
with pneumonia.  Normal heart size and vascularity.  No enlarging
effusion or pneumothorax.  Stable exam.
IMPRESSION: Stable consolidative bilateral pneumonia

## 2010-11-21 IMAGING — CR DG CHEST 1V PORT
1 series · 1 of 1 positions shown · non-contrast
Comparison: 04/10/2010

CLINICAL DATA: Pneumonia.

PORTABLE CHEST - 1 VIEW

[series [date]]
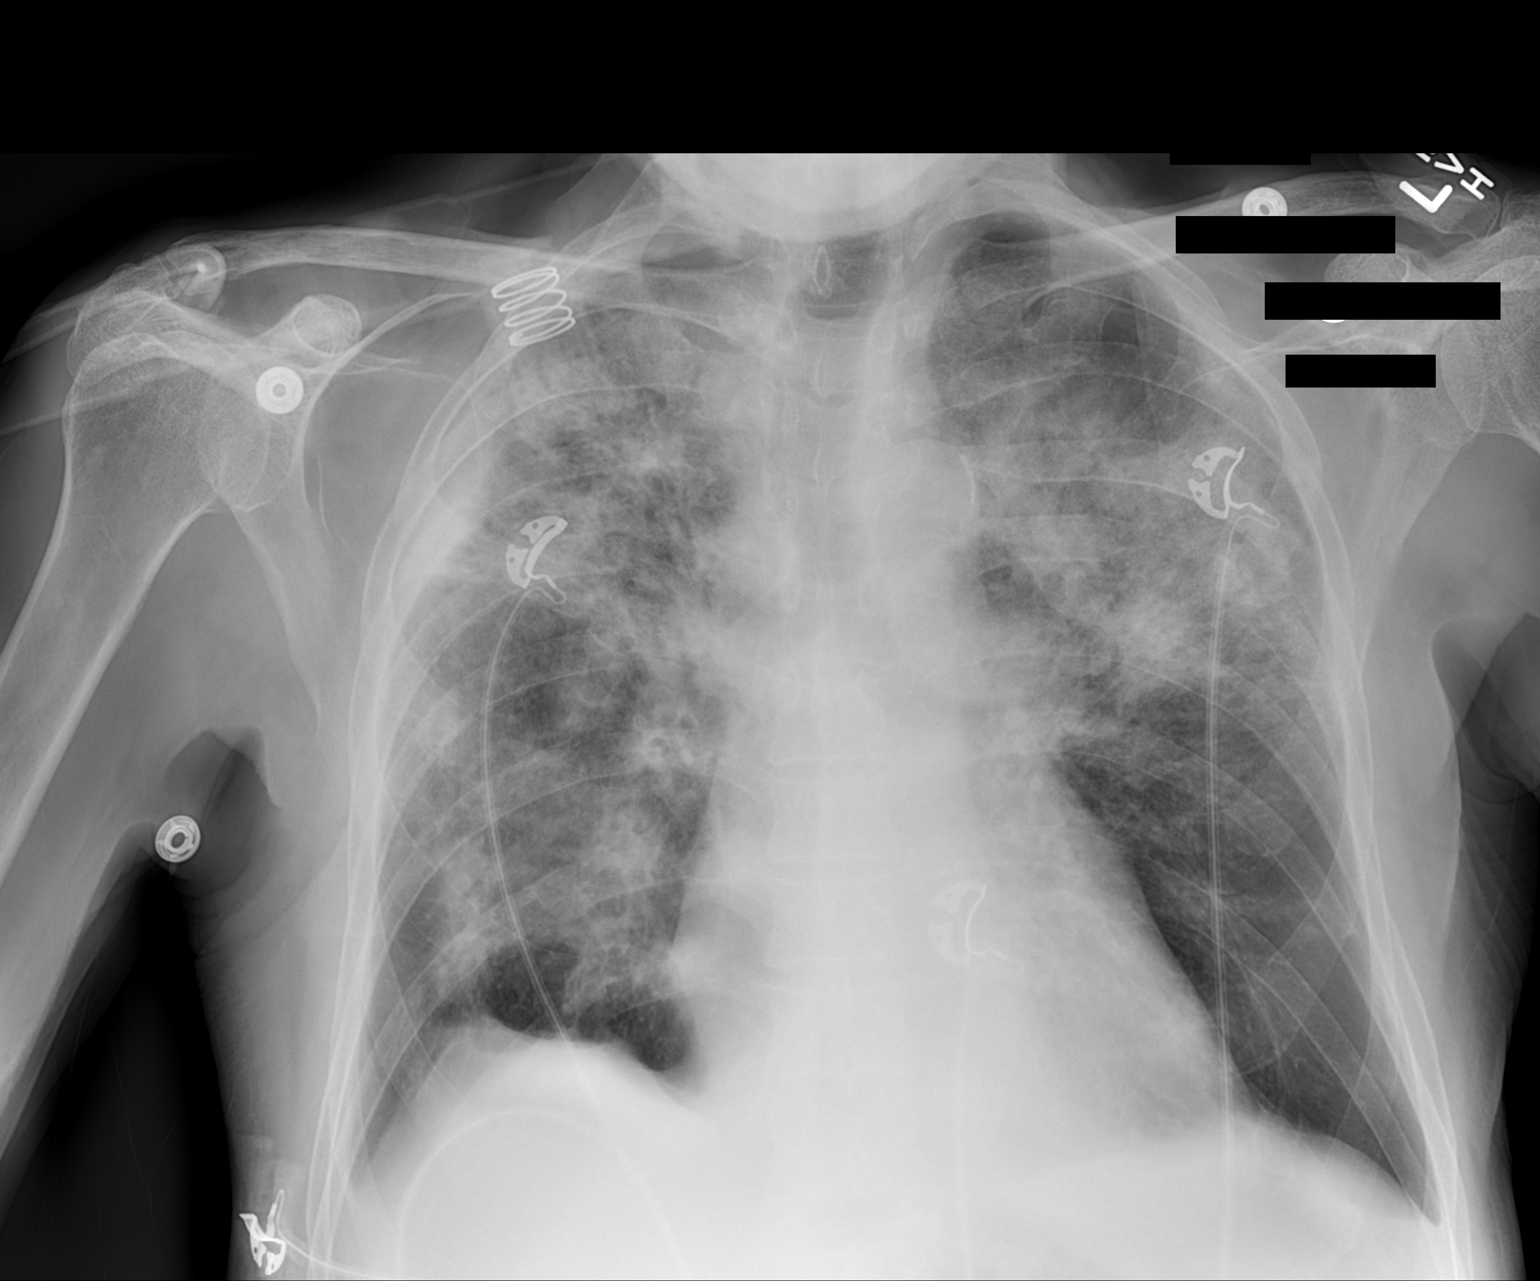

[1 of 1 positions shown; findings below may reference images not displayed]

FINDINGS: The cardiopericardial silhouette is normal in size.
Patchy bilateral airspace disease is stable, compatible with
pneumonia.  No definite effusion is seen.  The visualized soft
tissues and bony thorax are unremarkable.
IMPRESSION: 1.  Stable bilateral pneumonia.

## 2010-11-21 IMAGING — CT CT MAXILLOFACIAL W/O CM
1 series · 16 of 30 positions shown, 20 images · non-contrast
Comparison: None

CT HEAD

CLINICAL DATA: Wegener's granulomatosis.  Rule out obstruction of
the left eustachian tube.

CT HEAD WITHOUT CONTRAST
CT MAXILLOFACIAL WITHOUT CONTRAST
TECHNIQUE: Multidetector CT imaging of the head and maxillofacial
structures were performed using the standard protocol without
intravenous contrast. Multiplanar CT image reconstructions of the
maxillofacial structures were also generated.

[Series 3: headseq 4.8 h45s · axial · 0.45mm/px · z∈[-116,+13]mm · 16 of 30 slices shown, 20 images]
[im 2/30  brain]
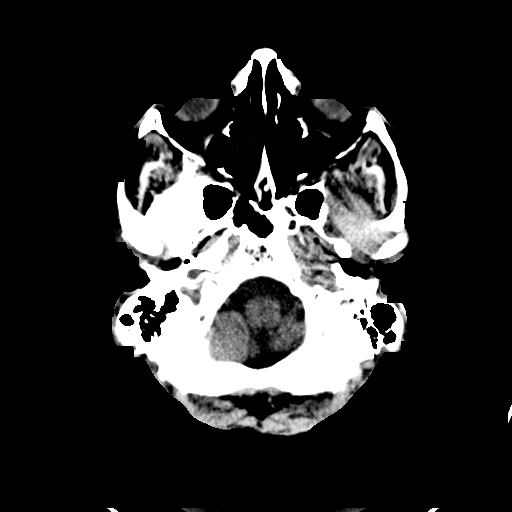
[im 2/30  bone]
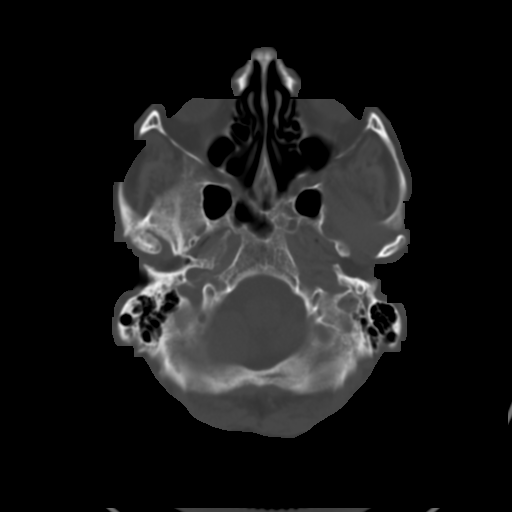
[im 4/30  bone]
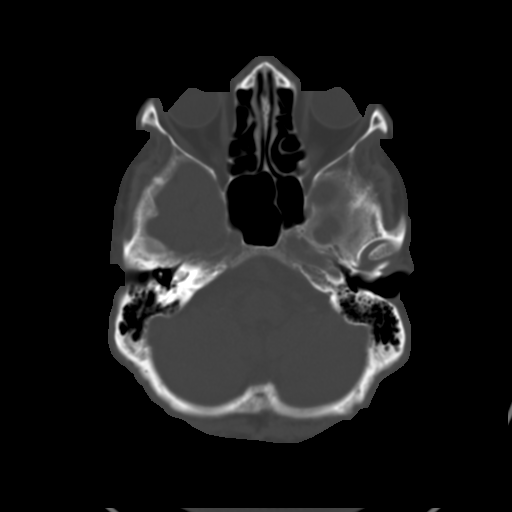
[im 6/30  bone]
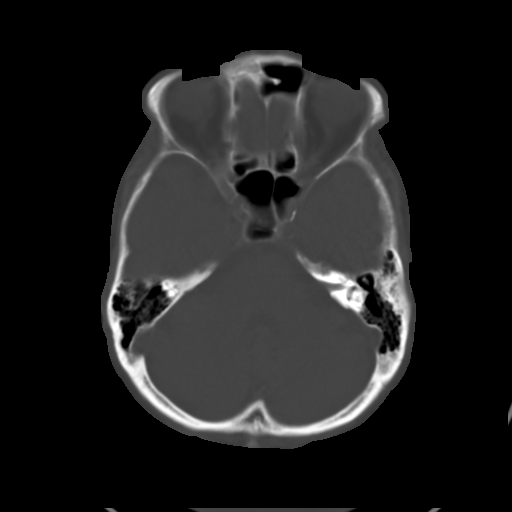
[im 8/30  bone]
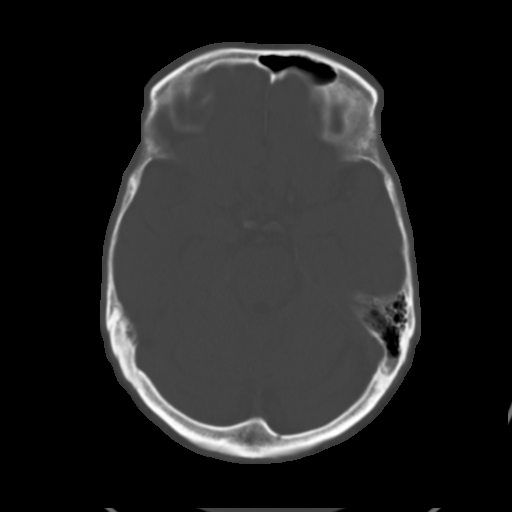
[im 9/30  brain]
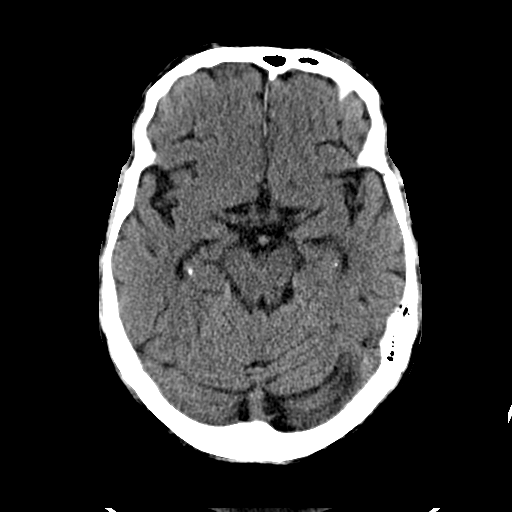
[im 9/30  bone]
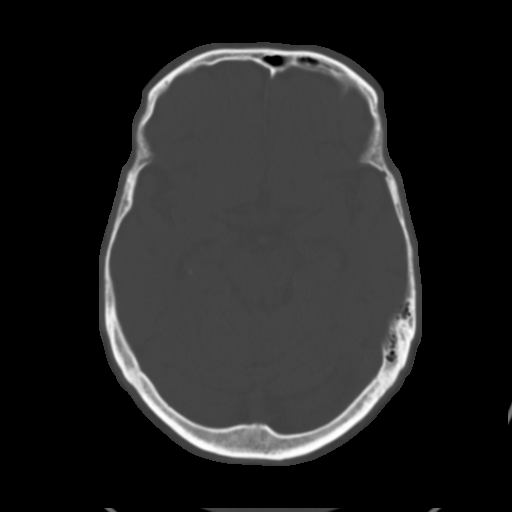
[im 11/30  bone]
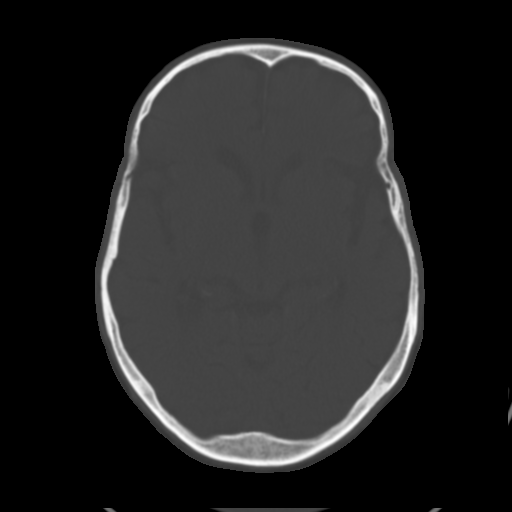
[im 13/30  bone]
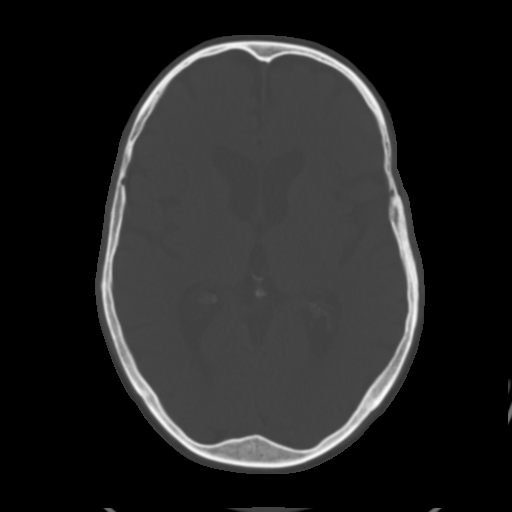
[im 15/30  bone]
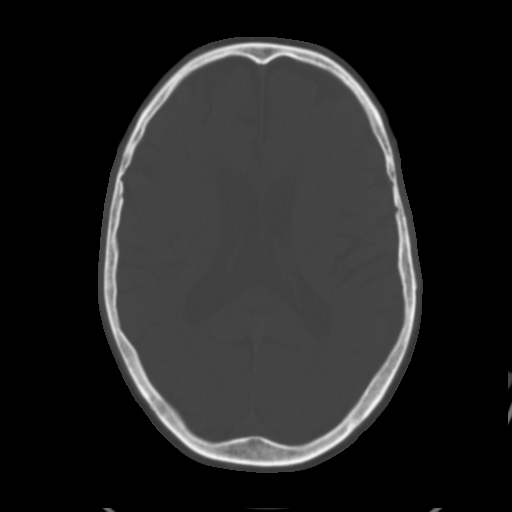
[im 16/30  brain]
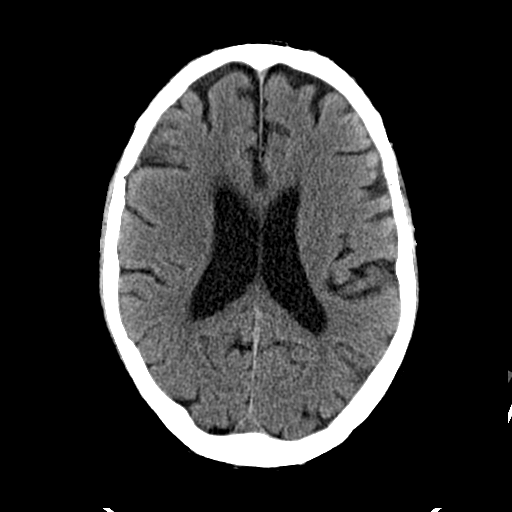
[im 16/30  bone]
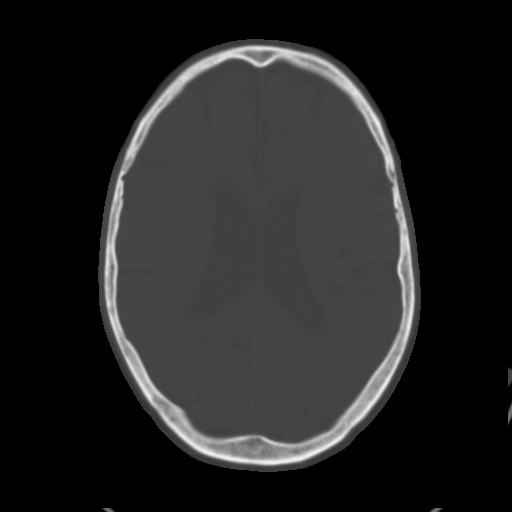
[im 18/30  bone]
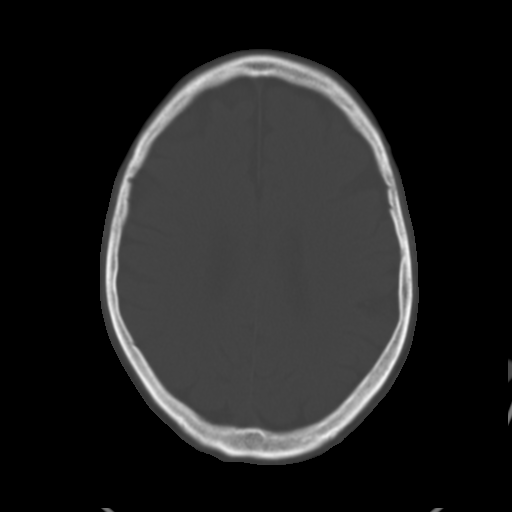
[im 20/30  bone]
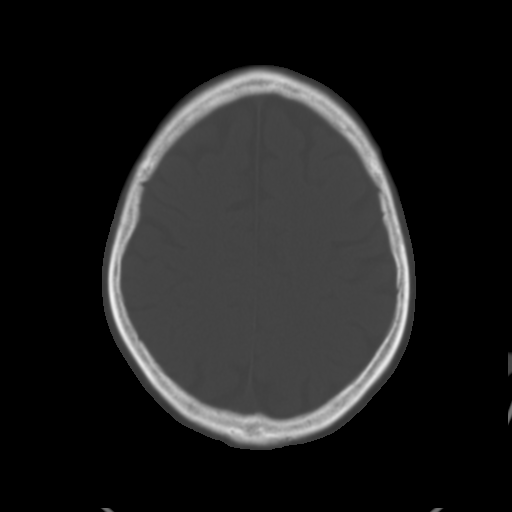
[im 22/30  bone]
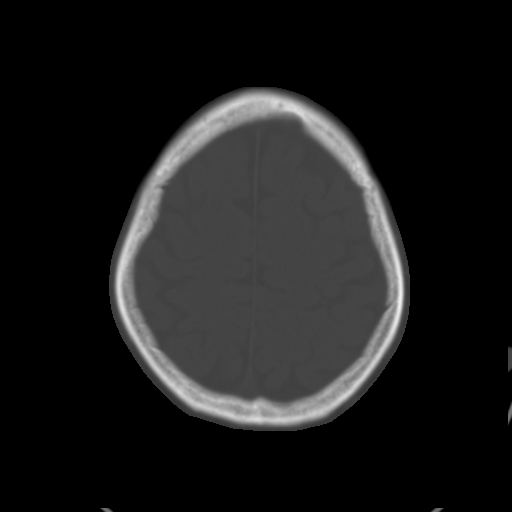
[im 23/30  brain]
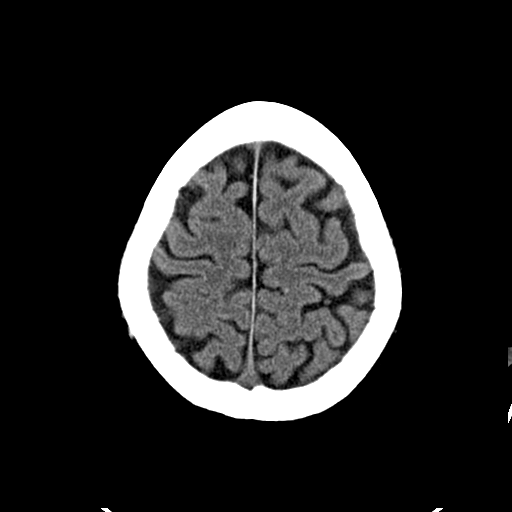
[im 23/30  bone]
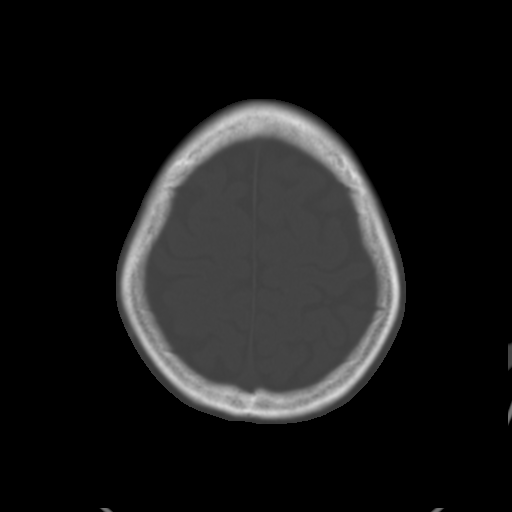
[im 25/30  bone]
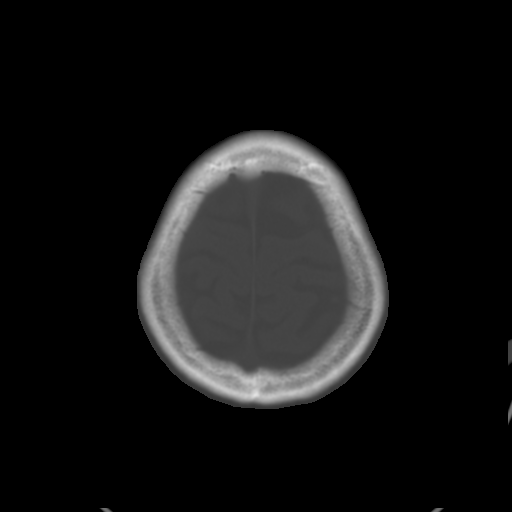
[im 27/30  bone]
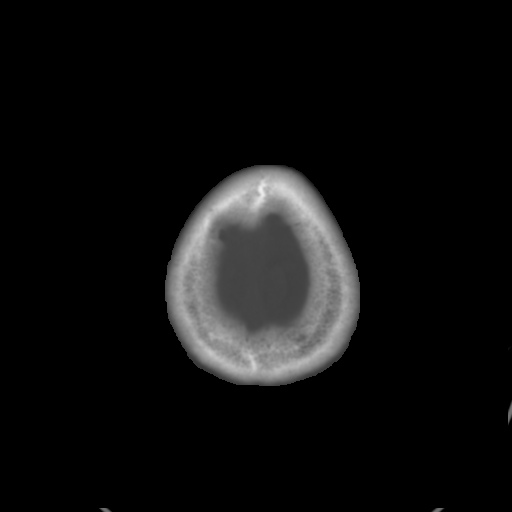
[im 29/30  bone]
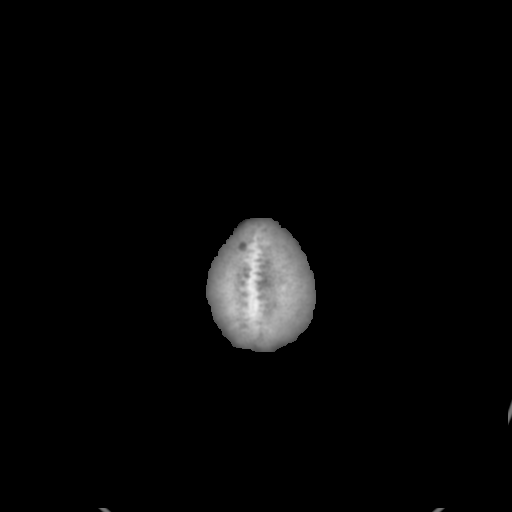

[16 of 30 positions shown; findings below may reference images not displayed]

FINDINGS: Age appropriate atrophy.  Negative for mass or
hemorrhage.  No acute infarct is present.  Calvarium is intact.
The mastoid sinus is clear bilaterally.  The visualized paranasal
sinuses are clear.
IMPRESSION: No significant abnormality for age.

CT MAXILLOFACIAL
FINDINGS: The paranasal sinuses are clear bilaterally.  The
mastoid sinus is clear and middle ear is clear bilaterally.  There
is no mass in the posterior nasopharynx.  No evidence of
obstruction of the eustachian tube.  No soft tissue mass is
present. Degenerative change in the TMJ on the right.
IMPRESSION: Negative

## 2010-11-28 IMAGING — CR DG CHEST 2V
2 series · 2 of 2 positions shown · non-contrast
Comparison: 04/15/2010

CLINICAL DATA: Shortness of breath.  Pneumonia.

CHEST - 2 VIEW

[w chest pa]
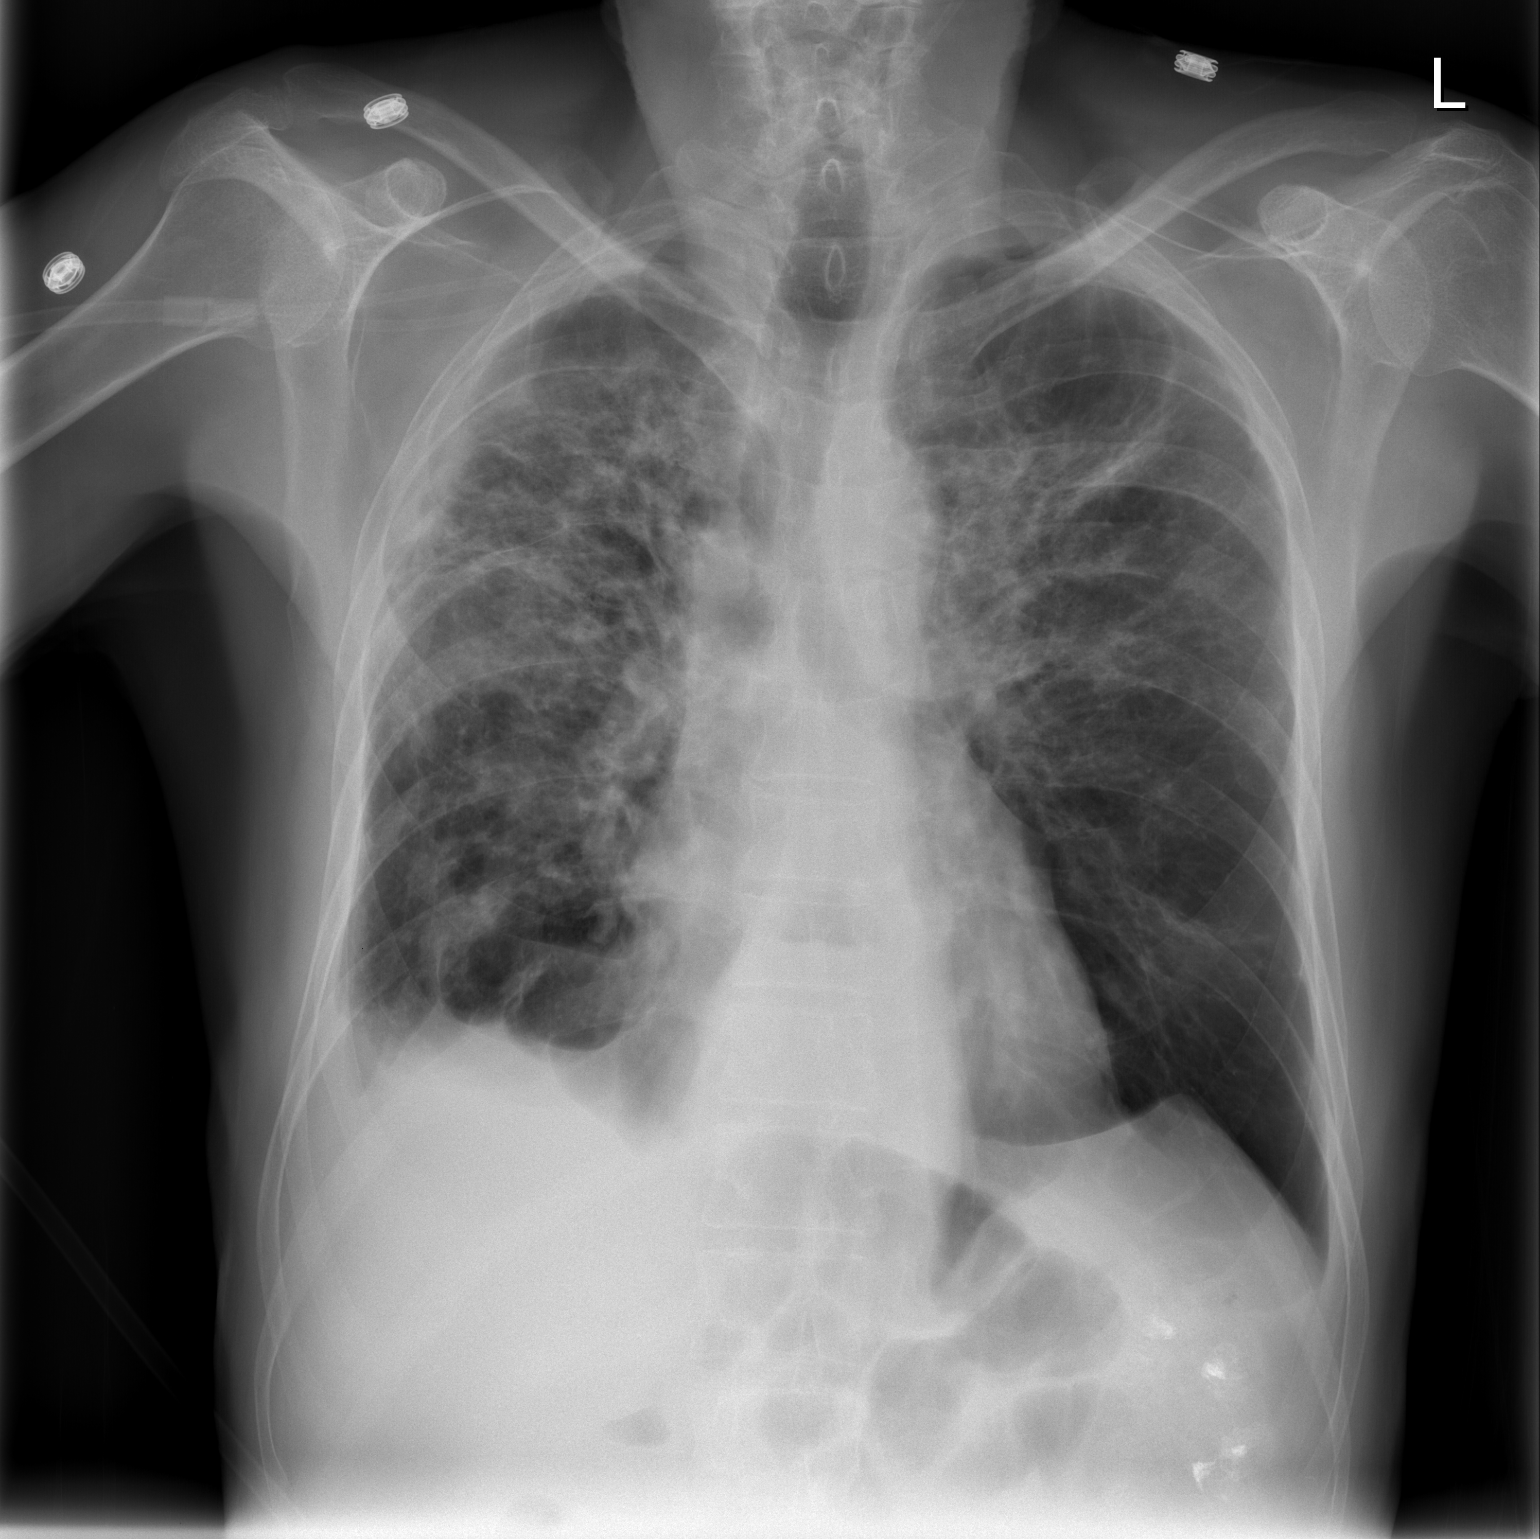

[w chest lat]
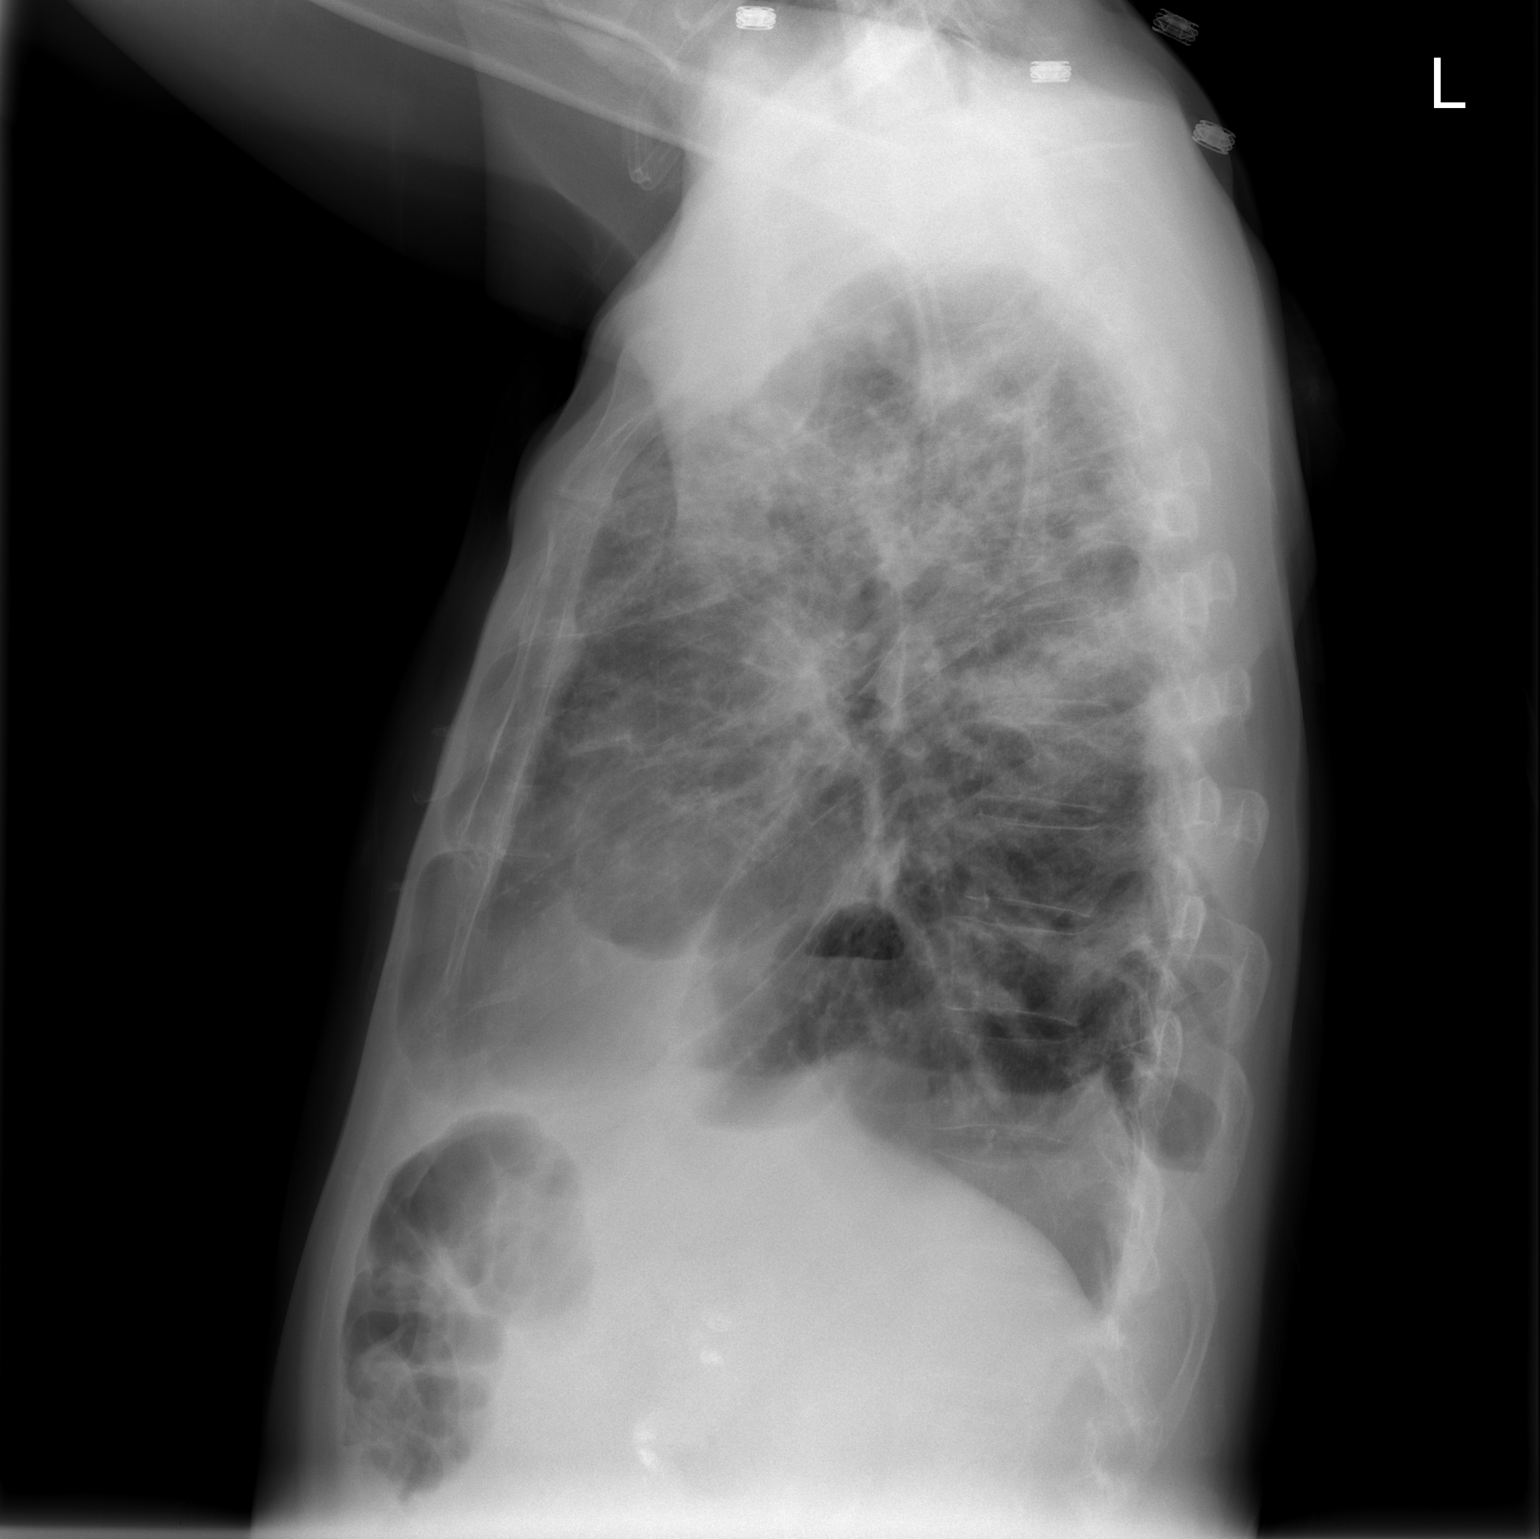

[2 of 2 positions shown; findings below may reference images not displayed]

FINDINGS: Interval improvement in upper lobe predominant bilateral
pulmonary air space disease is seen since prior exam. The bibasilar
scarring again noted as well as a small hiatal hernia.  Heart size
is normal.
IMPRESSION: Interval improvement the upper lobe-predominant bilateral pulmonary
air space disease.

## 2010-11-29 ENCOUNTER — Encounter: Payer: Self-pay | Admitting: Gastroenterology

## 2010-11-29 ENCOUNTER — Ambulatory Visit (AMBULATORY_SURGERY_CENTER): Payer: Medicare Other | Admitting: Gastroenterology

## 2010-11-29 DIAGNOSIS — K219 Gastro-esophageal reflux disease without esophagitis: Secondary | ICD-10-CM

## 2010-11-29 DIAGNOSIS — R1310 Dysphagia, unspecified: Secondary | ICD-10-CM

## 2010-11-29 DIAGNOSIS — K449 Diaphragmatic hernia without obstruction or gangrene: Secondary | ICD-10-CM

## 2010-11-29 DIAGNOSIS — K222 Esophageal obstruction: Secondary | ICD-10-CM

## 2010-11-29 HISTORY — PX: ESOPHAGOGASTRODUODENOSCOPY (EGD) WITH ESOPHAGEAL DILATION: SHX5812

## 2010-11-29 MED ORDER — SODIUM CHLORIDE 0.9 % IV SOLN: 500.0000 mL | INTRAVENOUS | Status: DC

## 2010-11-29 NOTE — Patient Instructions (Addendum)
A stricture was seen at the gastroesophageal junction and it was dilated.  Also a hiatal hernia was seen.  Continue taking your nexium.  See post dilatation diet for today.  A handout was given to patient's care partner re: dilatation diet and GERD.  Please call with any questions or concerns.

## 2010-11-30 ENCOUNTER — Telehealth: Payer: Self-pay

## 2010-11-30 NOTE — Telephone Encounter (Signed)

## 2010-12-03 ENCOUNTER — Encounter: Payer: Self-pay | Admitting: Gastroenterology

## 2010-12-05 IMAGING — CR DG CHEST 1V PORT
1 series · 1 of 1 positions shown · non-contrast
Comparison: 04/25/2010 [DATE] hours

CLINICAL DATA: Short of breath

PORTABLE CHEST - 1 VIEW

[view not recorded]
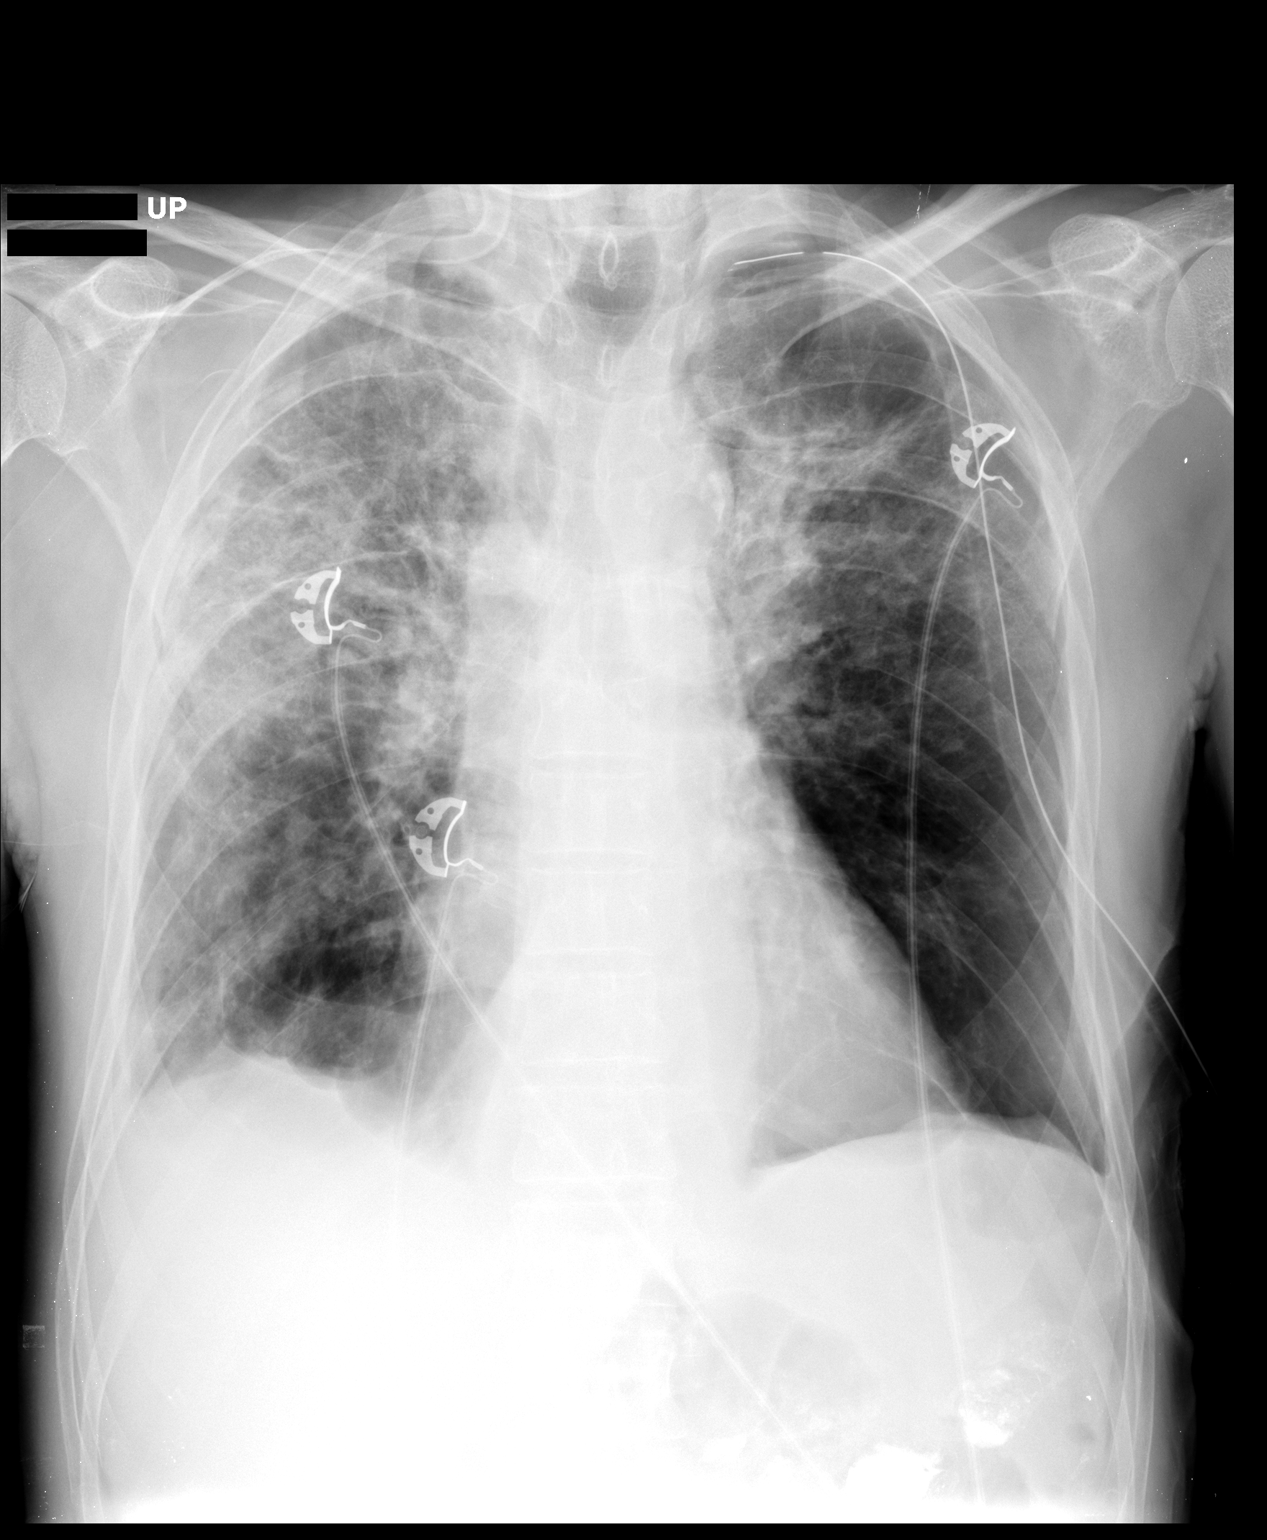

[1 of 1 positions shown; findings below may reference images not displayed]

FINDINGS: Left chest tube has been placed.  Left pneumothorax has
nearly resolved.  Opacities throughout the right lung and left
upper lobe are stable.  No right pneumothorax.
IMPRESSION: Interval placement of a left chest tube and near resolution of the
left pneumothorax.

## 2010-12-05 IMAGING — CR DG CHEST 1V PORT
1 series · 1 of 1 positions shown · non-contrast
Comparison: 04/18/2010

CLINICAL DATA: Shortness of breath and chest tightness.

PORTABLE CHEST - 1 VIEW

[view not recorded]
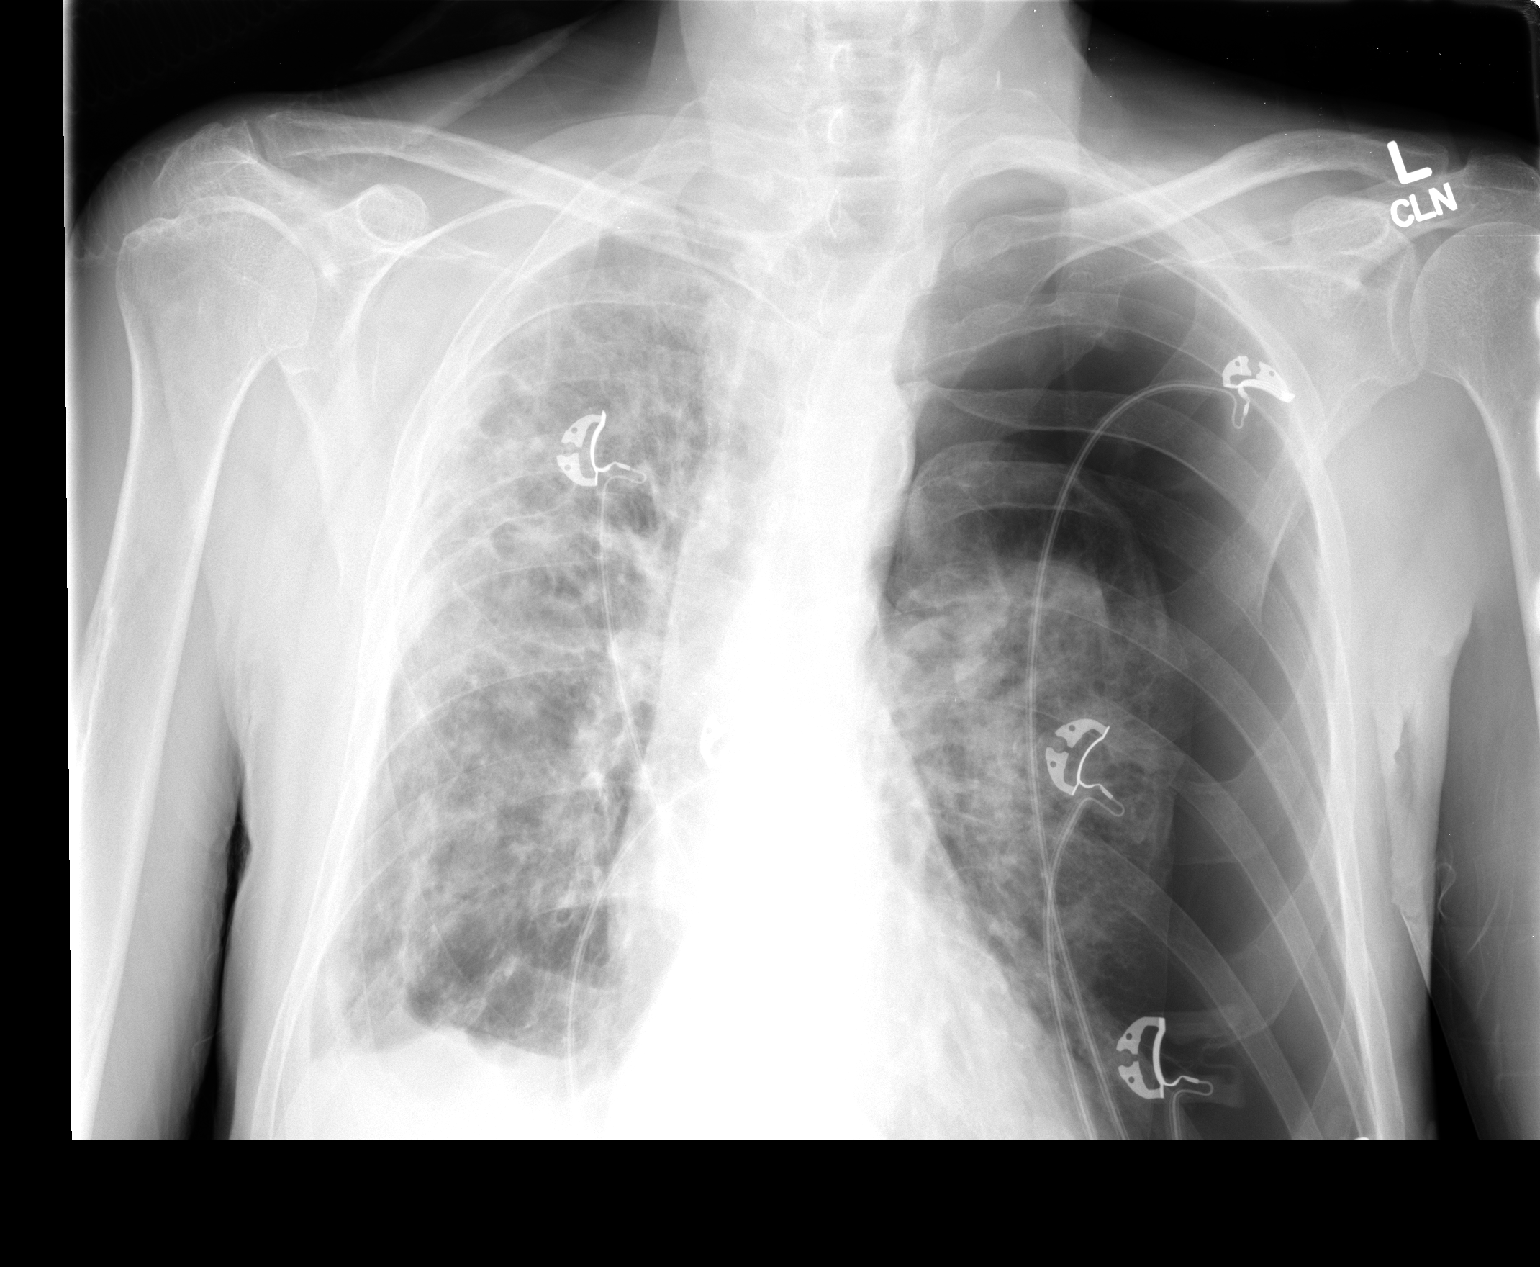

[1 of 1 positions shown; findings below may reference images not displayed]

FINDINGS: There is a large left pneumothorax.  Mediastinal
structures are slightly shifted toward the right consistent with
tension. There are stable areas of scarring in the right lung base.
Diffuse interstitial prominence of the right lung appears similar
compared to 04/18/2010.
IMPRESSION: Large left tension pneumothorax. Critical test results telephoned
to Dr. Agtang at the time of interpretation on 04/25/2010 at [DATE].

## 2010-12-06 ENCOUNTER — Ambulatory Visit (INDEPENDENT_AMBULATORY_CARE_PROVIDER_SITE_OTHER): Payer: Medicare Other | Admitting: Pulmonary Disease

## 2010-12-06 ENCOUNTER — Telehealth: Payer: Self-pay | Admitting: Internal Medicine

## 2010-12-06 ENCOUNTER — Encounter: Payer: Self-pay | Admitting: Pulmonary Disease

## 2010-12-06 VITALS — BP 102/60 | HR 66 | Temp 98.9°F | Ht 67.0 in | Wt 118.4 lb

## 2010-12-06 DIAGNOSIS — J209 Acute bronchitis, unspecified: Secondary | ICD-10-CM | POA: Insufficient documentation

## 2010-12-06 NOTE — Assessment & Plan Note (Signed)
He has hx of Wegener's on chronic prednisone and cyclophosphamide.  He has acute worsening of symptoms likely related to acute bronchitis.  He has improvement since starting doxycycline, and I have advised him to finish his 10 day course of this.  I don't think he needs additional chest xray or lab tests at this time.  He is to also continue with prednisone until he is re-evaluated by Dr. [name].  Advised him to call if his symptoms get worse.

## 2010-12-06 NOTE — Patient Instructions (Signed)
Finish course of Doxycycline Continue prednisone Follow up with Dr. [name] in two weeks Call sooner if symptoms get worse

## 2010-12-06 NOTE — Progress Notes (Signed)
Subjective:    Patient ID: Richard [name], male    DOB: Jun 11, 1936, 75 y.o.   MRN: [id number]  HPI 30 yowm never smoker with dx of Longstanding bronchiectasis then dx with WG 03/2010 > requested establish with [name].  July 08, 2009- Bronchiectasis, chronic bronchits, allergic rhinitis  Had flu vax and has had at least 2 pneumovax.  Admit [facility] dx WG by pos c anca 03/2010, supportive tbbx but not vats  Admit [name] dx spont L Ptx 9/08/15/09 requiring Chest tube but no surgery  05/24/10--NP ov/ med reivew. Since last visit. has been hospitalized 05/12/2010-- 10/10/2011for Right pneumothorax., with underlying Wegener granulomatosis. and . Bronchiectasis. Pt was found to have Right Pneumo. Chest tube was placed. with improvement and removed 10/7. Xray on 10/10 w/ resolved pnuemo. He was continued on cytoxan and bactrim for PCP prophylaxis. rec. Prednisone 20 mg reduce to one half daily  Aspirin 81 mg one daily with breakfast but stop for any bleeding  See Patient Care Coordinator before leaving for cardiology appt > saw Dr [name], ? LHC needed  July 21, 2010 ov sob better no longer needing any 02 at all during day, no hemoptysis , producing occ yellow mucus - reports intermittent noct bilateral shoulder pain better with streching, moving, massage. no assoc nausea or sweats. recd try pred 5 mg daily  August 24, 2010 ov cough w/ yellow phlem, breathing is better, using flutter and med calendar consistently. R chest pain with deep breathing no change since R chest tube removed in 05/2010.  October 13, 2010 --Presents for an acute office visit. Complains of productive cough with yellow mucus, sore throat, rattling in chest, low grade temp x3days. Currently on pred 1/2 every other day . OTC not helping. rec continue qod pred, rx with augmentin x 10 days  11/16/2010 ov all smiles, no change on days of prednisone, minimal discolored sputum.   12/06/10 -- Acute visit.  Told to stop pred on 04/10, started again  04/24.  More winded and so restart prednisone, felt fluid build in chest, more cough with yellow sputum.  Started doxycycline 04/28.  Cough improved, still sputum, temp up to 100.8, chronic sinus runny, throat okay, no blood, no rash/swell, not much appetite, maintaining wt.  PMHx:  Allergic rhinitis  GERD- hiatal hernia with reflux  - Repeat Barium Swallow c/w stricture and reflux 03/29/10  Bronchiectasis  - See CT chest 12/14/2009 > progressive bilateral R> L cavities 03/26/10 > clinical dx Wegener's 04/07/10  - PFTs 02/17/10 FEV1 1.52 (58%) ratio 42 and 15% better after B2, DLC0 118%  WEGENER'S GRANULOMATOSIS  - dX BY pos C anca/ supportive TBBX 04/10/10 > CYTOXAN initiated  - Prednisone tapered off March 2012  PAF/ abn echo during [facility] admit 03/30/10  - ECH0 Aug 23 2011LVH with low nl ef, mild mr, ok R function, peak trop .59  - Not anticoagulated due to WG with Hemoptysis  L PTX 04/25/10  R PTX 05/12/10 > final f/u by [name] 06/01/10  benign hypertrophy of the prostate  Complex med regimen--Meds reviewed with pt education and computerized med calendar May 25, 2010  Family History:  Mother- deceased age 44;car wreck  Father- deceased age 15; breathing problems;asthma  Brother- living age 31; heart CAD (s/p CABG)  Sibling- deceased age 22; heart (s/p CABG)  Social History:  Retired  Non smoker  Married with 3 children  Risk Factors:  Smoking Status: never (05/27/2010)     Review of Systems  Objective:   Physical Exam Filed Vitals:   12/06/10 1400  BP: 102/60  Pulse: 66  Temp: 98.9 F (37.2 C)  TempSrc: Oral  Height: 5\' 7"  (1.702 m)  Weight: 118 lb 6.4 oz (53.706 kg)  SpO2: 96%       SKIN: no rash, lesions  NODES: no lymphadenopathy  HEENT: [place]/AT, EOM- WNL, Conjuctivae- clear, PERRLA, TM-WNL, Nose- clear, Throat- clear and wnl, Mallampati III  NECK: Supple w/ fair ROM, JVD- none, normal carotid impulses w/o bruits Thyroid-  CHEST: Mininimal exp rhonchi bilaterally    HEART: RRR, no m/g/r heard  ABDOMEN: Soft and nl;    Assessment & Plan:   Acute bronchitis He has hx of Wegener's on chronic prednisone and cyclophosphamide.  He has acute worsening of symptoms likely related to acute bronchitis.  He has improvement since starting doxycycline, and I have advised him to finish his 10 day course of this.  I don't think he needs additional chest xray or lab tests at this time.  He is to also continue with prednisone until he is re-evaluated by Dr. [name].  Advised him to call if his symptoms get worse.    Updated Medication List Outpatient Encounter Prescriptions as of 12/06/2010  Medication Sig Dispense Refill  . aspirin 81 MG tablet Take 81 mg by mouth daily.        Marland Kitchen b complex vitamins tablet Take 1 tablet by mouth daily.        . bisacodyl (DULCOLAX) 5 MG EC tablet Take 5 mg by mouth daily as needed.        . bisoprolol (ZEBETA) 5 MG tablet Take 5 mg by mouth. 1/2 tab daily       . budesonide-formoterol (SYMBICORT) 160-4.5 MCG/ACT inhaler Inhale 2 puffs into the lungs 2 (two) times daily.        . capsaicin (ZOSTRIX) 0.025 % cream Apply topically 2 (two) times daily.        . cyclophosphamide (CYTOXAN) 25 MG tablet Take 25 mg by mouth daily. Give on an empty stomach 1 hour before or 2 hours after meals.        Marland Kitchen doxycycline (VIBRAMYCIN) 100 MG capsule Take 100 mg by mouth 2 (two) times daily.        Marland Kitchen dutasteride (AVODART) 0.5 MG capsule Take 0.5 mg by mouth daily.        Marland Kitchen esomeprazole (NEXIUM) 40 MG capsule Take 40 mg by mouth daily before breakfast.        . Levalbuterol Tartrate (XOPENEX HFA IN) Inhale 45 mcg into the lungs. 2 puffs q 4 hours prn       . predniSONE (DELTASONE) 5 MG tablet 1/2 every am      . simethicone (MYLICON) 80 MG chewable tablet Chew 80 mg by mouth 2 (two) times daily.        Marland Kitchen sulfamethoxazole-trimethoprim (BACTRIM DS) 800-160 MG per tablet Take 1 tablet by mouth. Take 1 tab every Monday, Wednesday and Friday       .  Tamsulosin HCl (FLOMAX) 0.4 MG CAPS Take 0.4 mg by mouth daily.        . traMADol (ULTRAM) 50 MG tablet Take 50 mg by mouth every 6 (six) hours as needed.        Marland Kitchen DISCONTD: acetaminophen (TYLENOL) 325 MG tablet Take 650 mg by mouth as directed.        Marland Kitchen DISCONTD: Calcium Carbonate Antacid (MAALOX) 600 MG chewable tablet Chew 600 mg by mouth  as directed.        Marland Kitchen DISCONTD: sennosides-docusate sodium (SENOKOT-S) 8.6-50 MG tablet Take 1 tablet by mouth as directed.         Facility-Administered Encounter Medications as of 12/06/2010  Medication Dose Route Frequency Provider Last Rate Last Dose  . 0.9 %  sodium chloride infusion  500 mL Intravenous Continuous Meryl Dare, MD,FACG

## 2010-12-06 NOTE — Telephone Encounter (Signed)
Spoke w/ pt and he states he has been having increased SOB x 2 weeks. Pt states since Friday he feels like her has been retaining fluid and has been running a fever of 100.0. Pt states he feels bad. He started taking mucinex 1200 mg a day, doxycycline over the weekend that he had a refill on, and is taking prednisone 5 mg 1/2 tablet every other day but has had no relief. Pt is going to come in today and see VS at 2:00 to be evaluated

## 2010-12-07 IMAGING — CR DG CHEST 2V
2 series · 2 of 2 positions shown · non-contrast
Comparison: 04/26/2010 and 04/25/2010. Other prior studies are
correlated.

CLINICAL DATA: Spontaneous pneumothorax.  Follow up pneumonia.
History of Wegener's granulomatosis.

CHEST - 2 VIEW

[w chest pa]
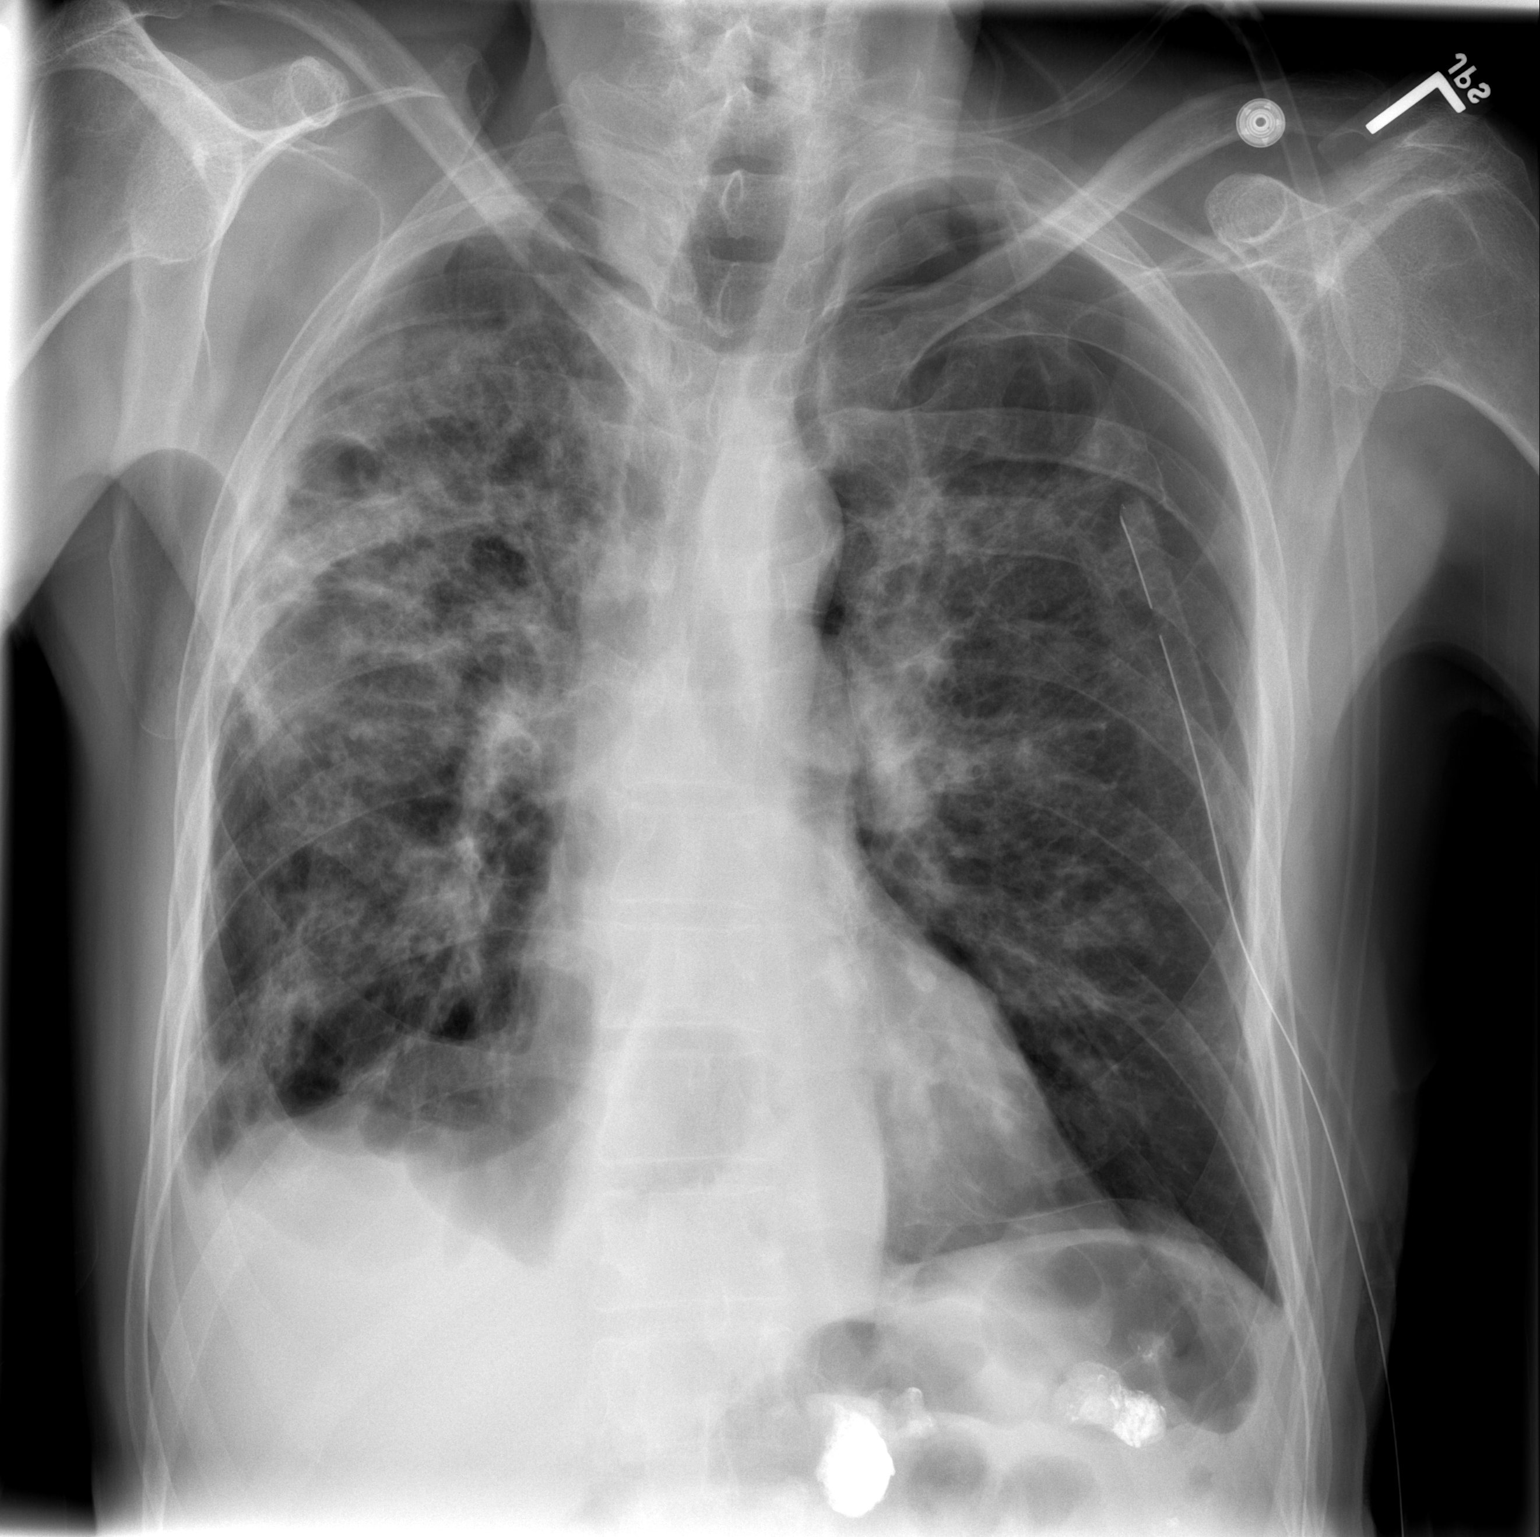

[w chest lat]
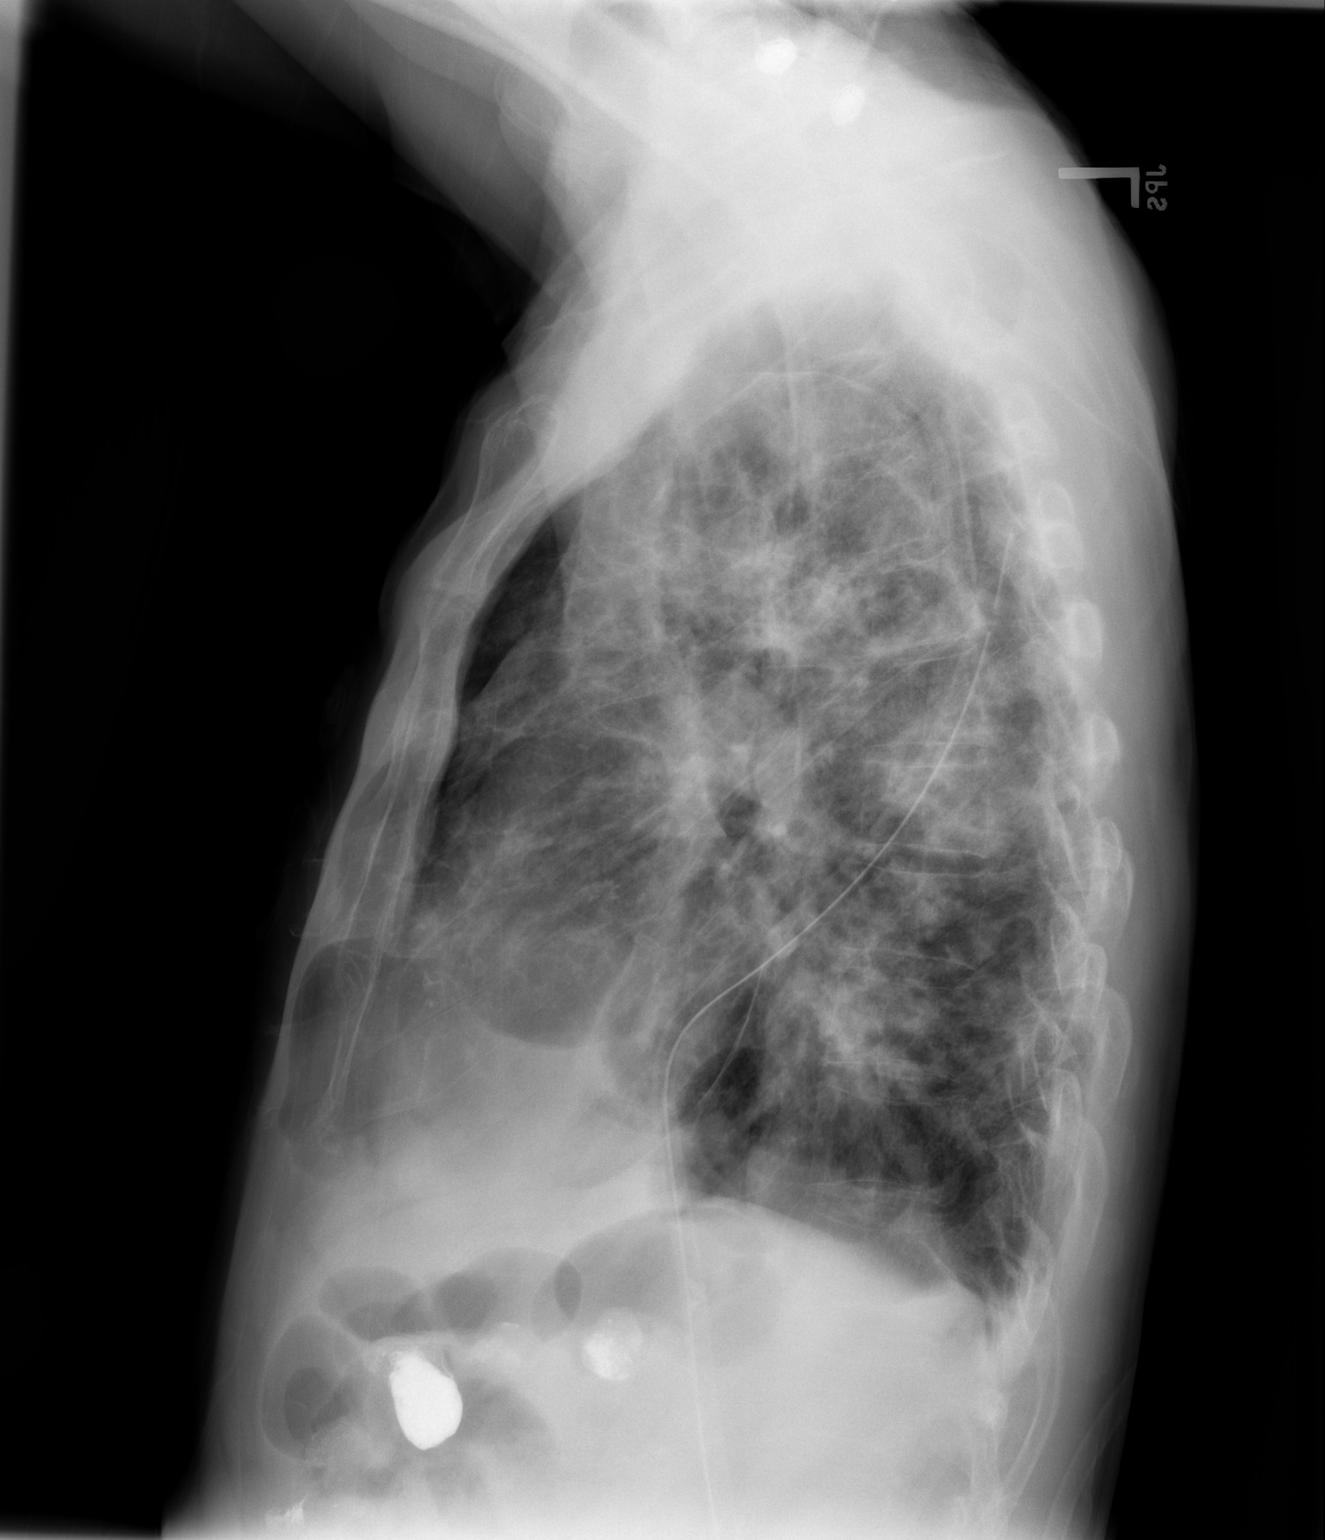

[2 of 2 positions shown; findings below may reference images not displayed]

FINDINGS: The left chest tube has been partially withdrawn into the
mid hemithorax.  The left-sided pneumothorax has enlarged, now
approximately 25%.  There is no mediastinal shift.  The heart size
and mediastinal contours are stable with a moderate sized hiatal
hernia.  Asymmetric bilateral air space opacities and cavitary
nodules have not substantially changed.  There is no significant
pleural effusion.
IMPRESSION: 1.  Interval enlargement of left-sided pneumothorax, now
approximately 25%.  Left chest tube remains in place.
2.  No significant change in bilateral airspace opacities and
cavitary nodules.

This has been made a call report.

## 2010-12-09 ENCOUNTER — Telehealth: Payer: Self-pay | Admitting: Internal Medicine

## 2010-12-09 IMAGING — CR DG CHEST 1V PORT
1 series · 1 of 1 positions shown · non-contrast
Comparison: 04/28/2010

CLINICAL DATA: Spontaneous tension pneumothorax.  Wegener's
granulomatosis.  Congestive heart failure.

PORTABLE CHEST - 1 VIEW

[AP]
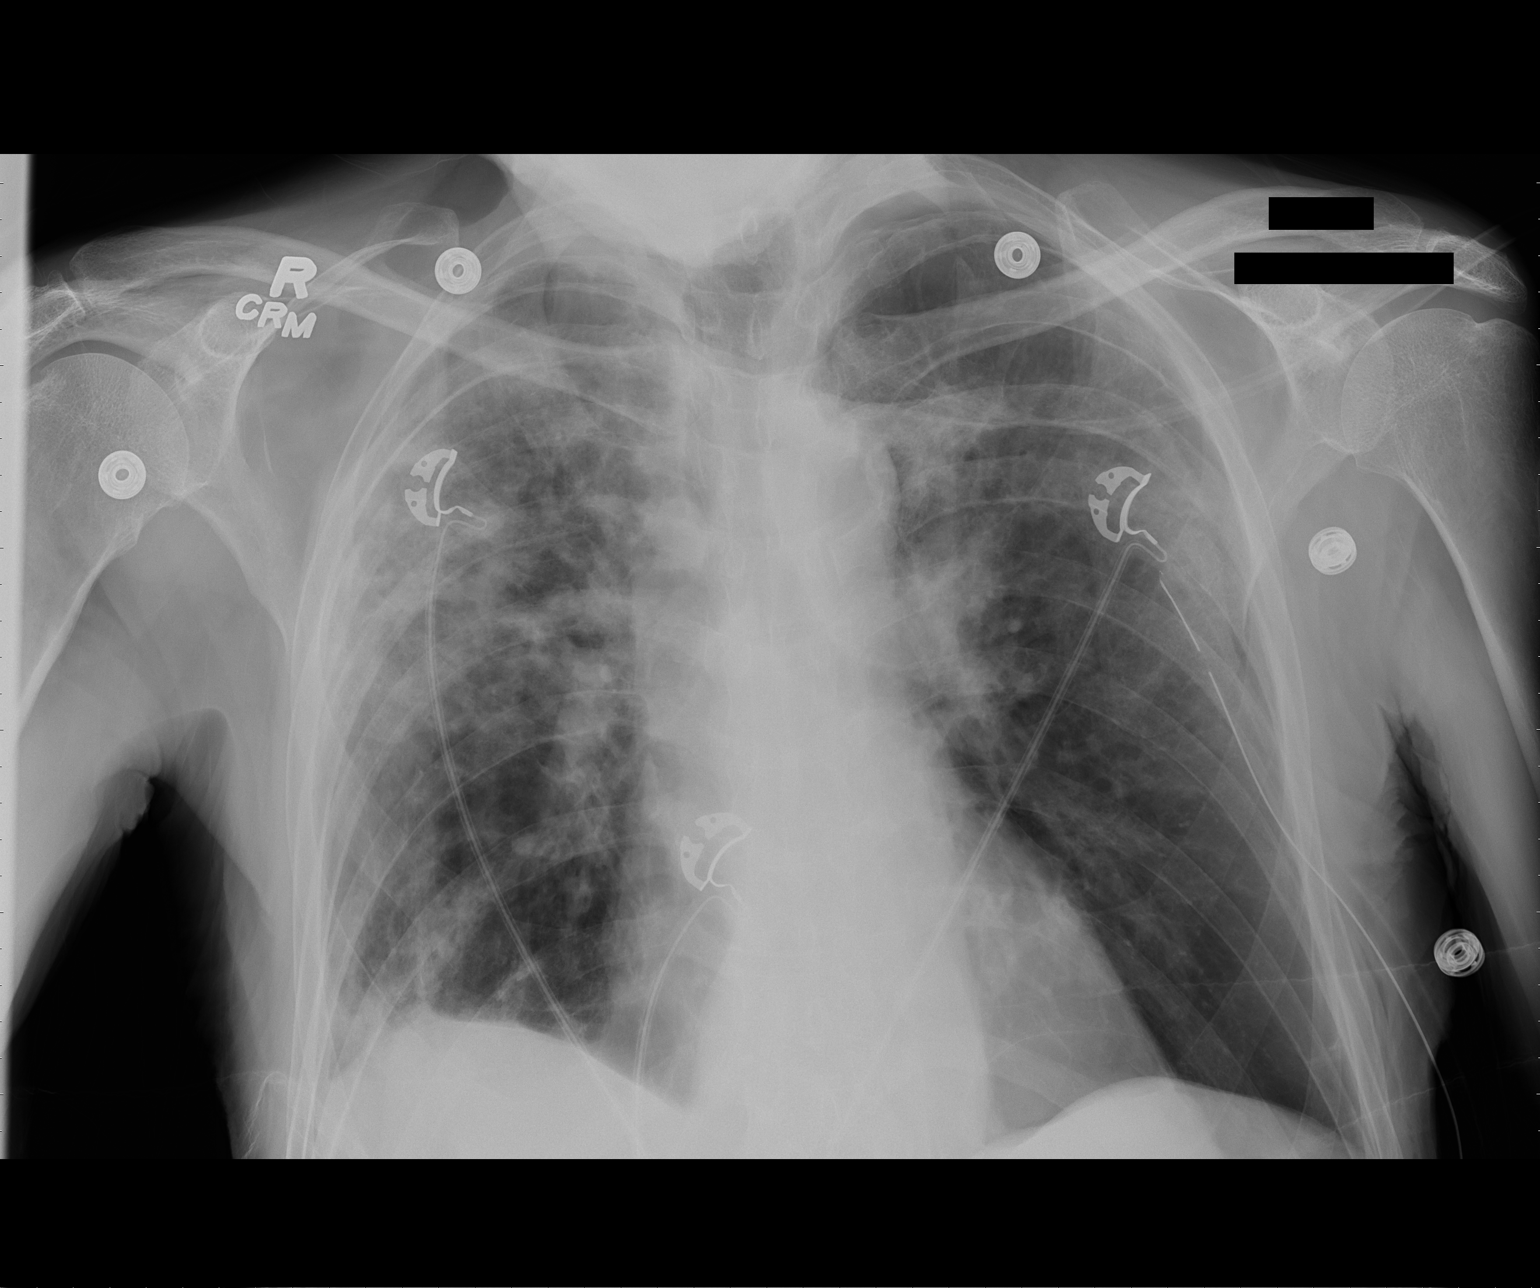

[1 of 1 positions shown; findings below may reference images not displayed]

FINDINGS: Essentially stable bilateral airspace opacities noted.
Left-sided chest tube is in place.

I suspect persistence of the tiny right apical pneumothorax
although the size of pneumothorax or subtle in this case.
Similarly, there is thought to be a tiny persistent left apical
pneumothorax although signs are subtle.

Heart size is within normal limits.
IMPRESSION: 1.  Stable appearance of the chest.

## 2010-12-09 MED ORDER — DOXYCYCLINE HYCLATE 100 MG PO CAPS
100.0000 mg | ORAL_CAPSULE | Freq: Two times a day (BID) | ORAL | Status: DC
Start: 2010-12-09 — End: 2010-12-16

## 2010-12-09 NOTE — Telephone Encounter (Signed)
Spoke w/ pt and is aware rx was sent to pharmacy for additional 3 days

## 2010-12-09 NOTE — Telephone Encounter (Signed)
Pt states he made a mistake and only had a 7 day course for doxycycline. He is on day 6 and says he doesn't feel much better and wants to know if he should have an additional 3 days called to his pharmacy. Pt says he is very weak, decreased appetite, still coughing (sputum has cleared some) and nasal drainage. Pls advise.

## 2010-12-09 NOTE — Telephone Encounter (Signed)
Yes, please send script for doxycyline 100 mg one tablet twice per day for 3 additional days.  Dispense 6 tablets with no refills.

## 2010-12-10 IMAGING — CR DG CHEST 1V PORT
1 series · 1 of 1 positions shown · non-contrast
Comparison: 04/30/2010

CLINICAL DATA: Chest tube removal.

PORTABLE CHEST - 1 VIEW

[view not recorded]
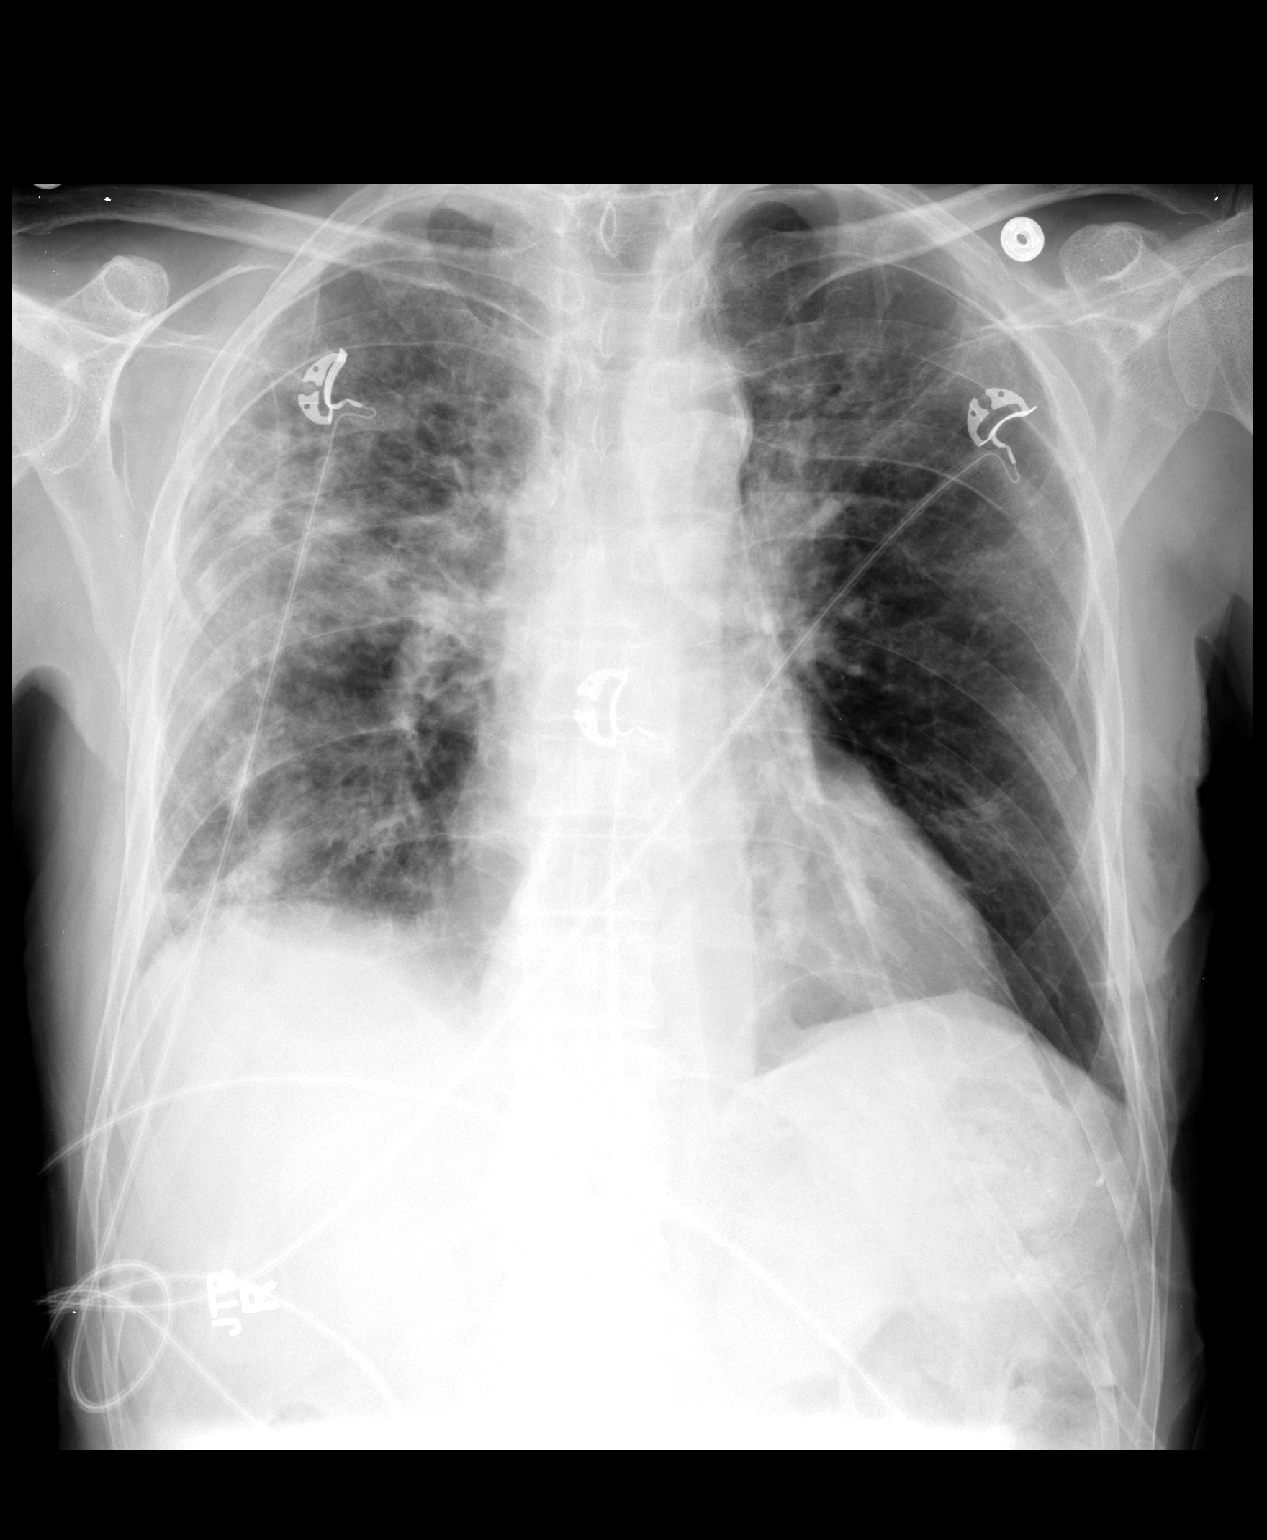

[1 of 1 positions shown; findings below may reference images not displayed]

FINDINGS: Trachea is midline.  Heart size stable.  Left chest tube
has been removed.  There is a curvilinear lucency at the medial
base of the left hemithorax, which may represent a small amount of
loculated pleural air.  In retrospect, this is unchanged from
04/30/2010.  Bilateral air space disease and mild architectural
distortion persist, right greater than left.
IMPRESSION: 1.  Question small amount of loculated pleural air at the base of
the left hemithorax, stable, after left chest tube removal.
2.  Bilateral air space disease and architectures distortion, right
greater than left.

## 2010-12-10 IMAGING — CR DG CHEST 1V PORT
1 series · 1 of 1 positions shown · non-contrast
Comparison: [HOSPITAL] at [REDACTED] [HOSPITAL] chest CT
07/24/2003, [REDACTED] chest x-ray 11/22/2006, [REDACTED] chest CT 03/26/2010 and 04/02/2010, and [REDACTED] chest x-rays 04/25/2010 through 04/29/2010.

CLINICAL DATA: Spontaneous tension pneumothorax, congestive heart
failure, Wegener's granulomatosis.

PORTABLE CHEST - 1 VIEW

[view not recorded]
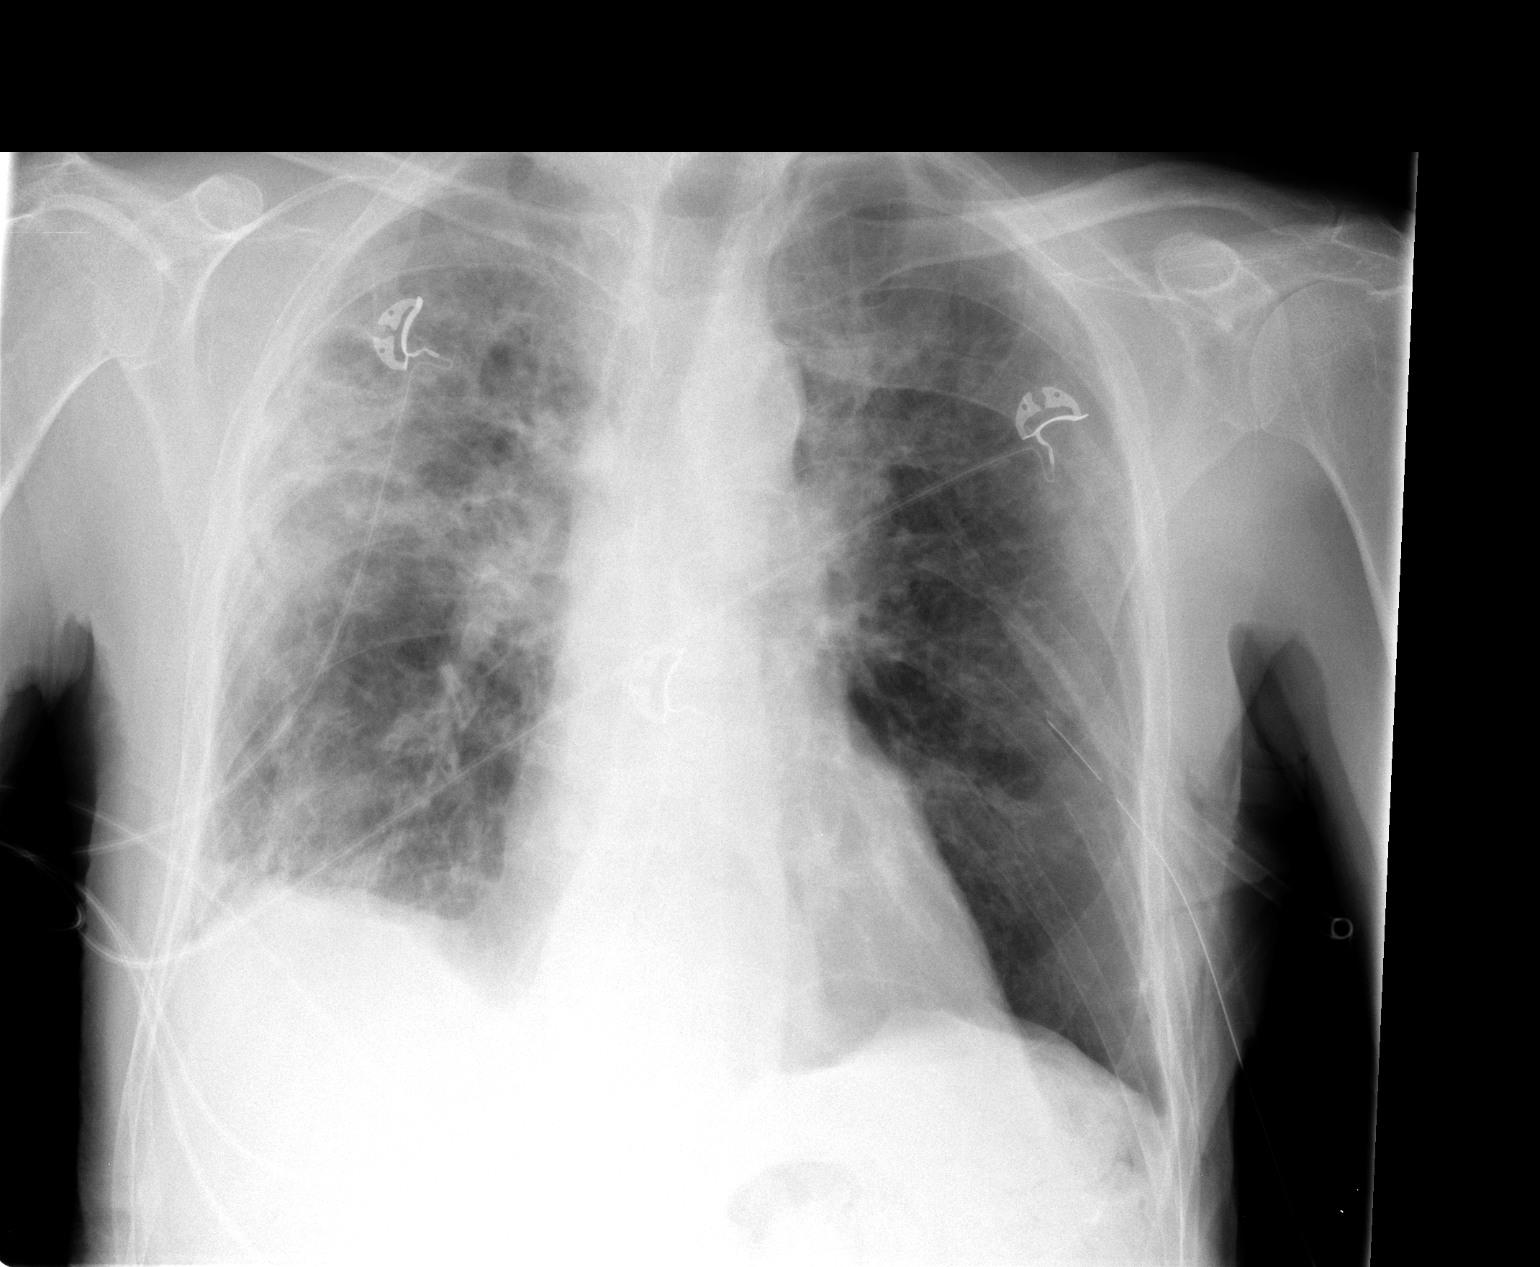

[1 of 1 positions shown; findings below may reference images not displayed]

FINDINGS: Since 04/29/2010, no persistent apical pneumothorax seen
bilaterally.  The left chest tube again visualized.  Lesser
inspiration with (right greater than left) patchy airspace
opacities seen.  Heart size remains normal.
IMPRESSION: 1.  Resolution previous tiny apical pneumothoraces with stable left
chest tube.
2.  Stable (right greater than left) patchy airspace opacities.
3.  No interval new acute abnormality.

## 2010-12-11 IMAGING — CR DG CHEST 2V
2 series · 2 of 2 positions shown · non-contrast
Comparison: 04/30/2010

CLINICAL DATA: Cough and shortness of breath.  Follow-up
pneumothorax.

CHEST - 2 VIEW

[w chest pa]
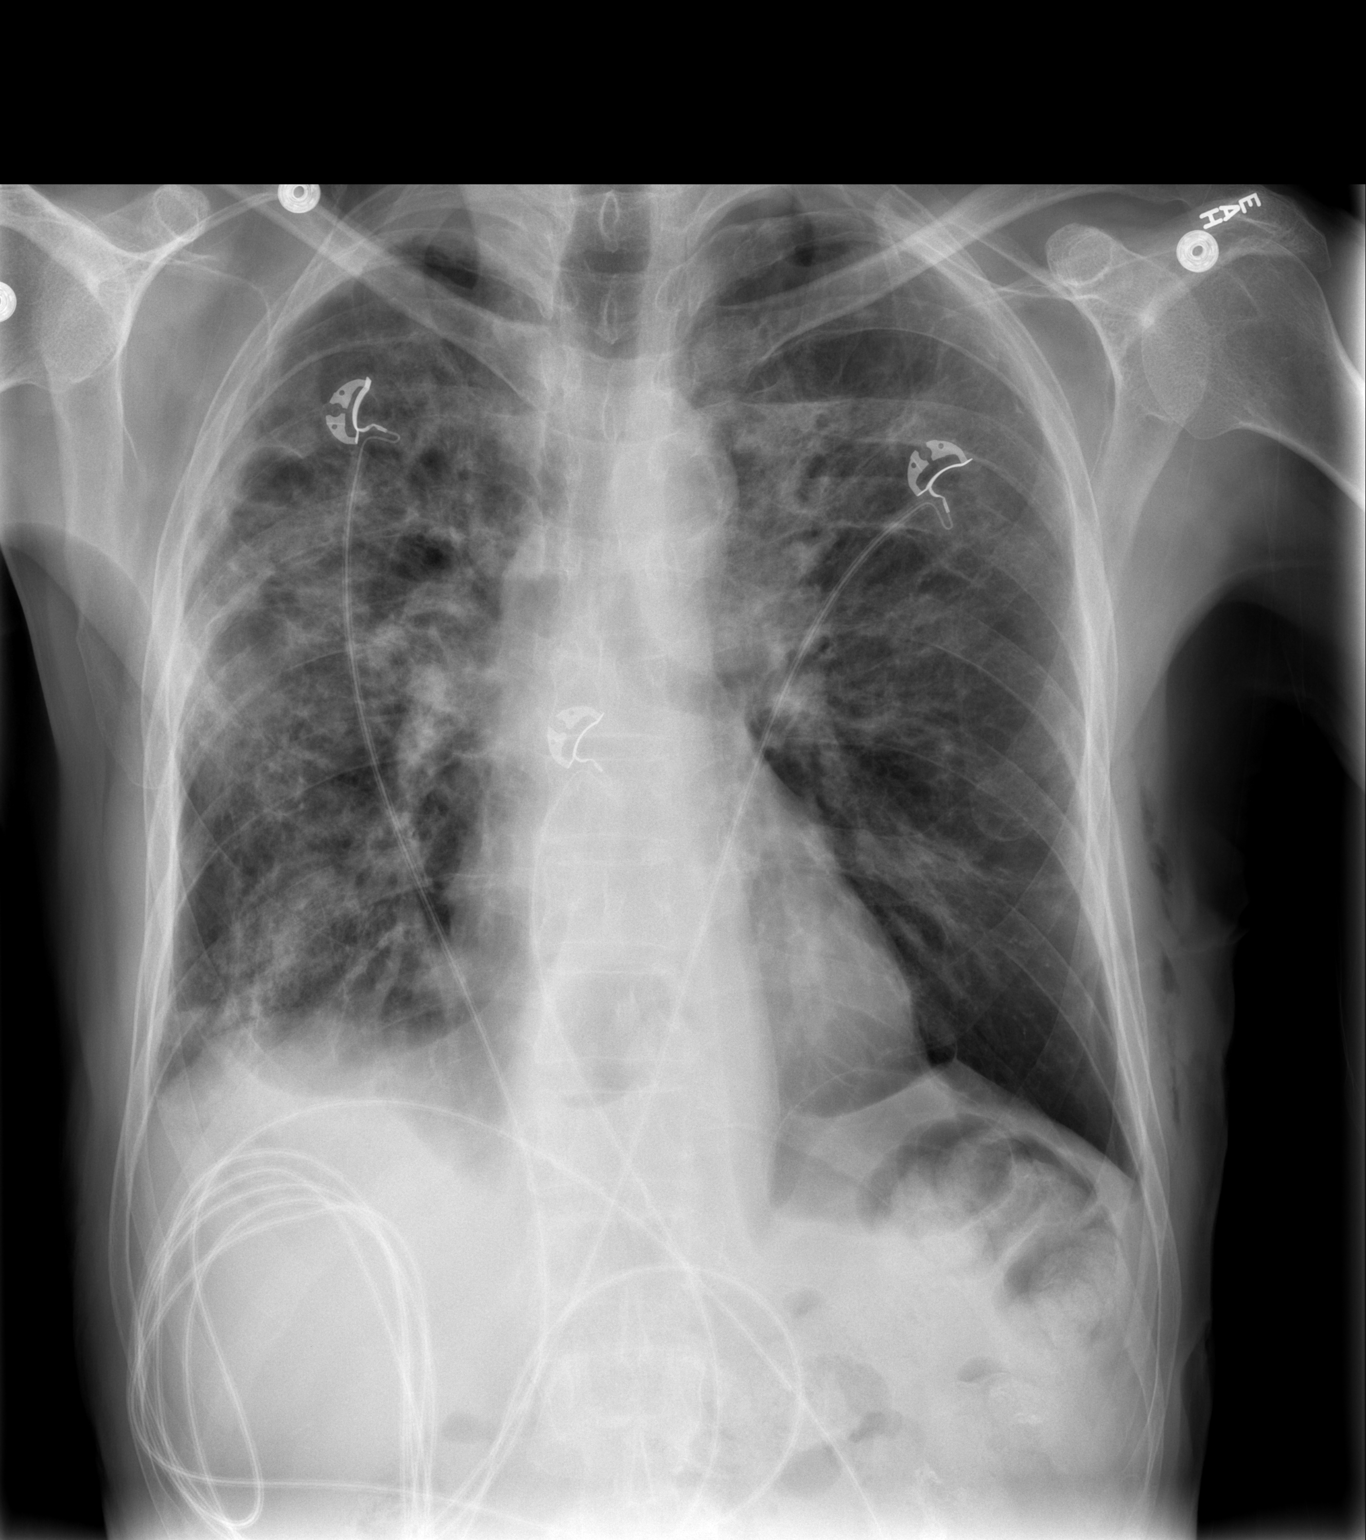

[w chest lat]
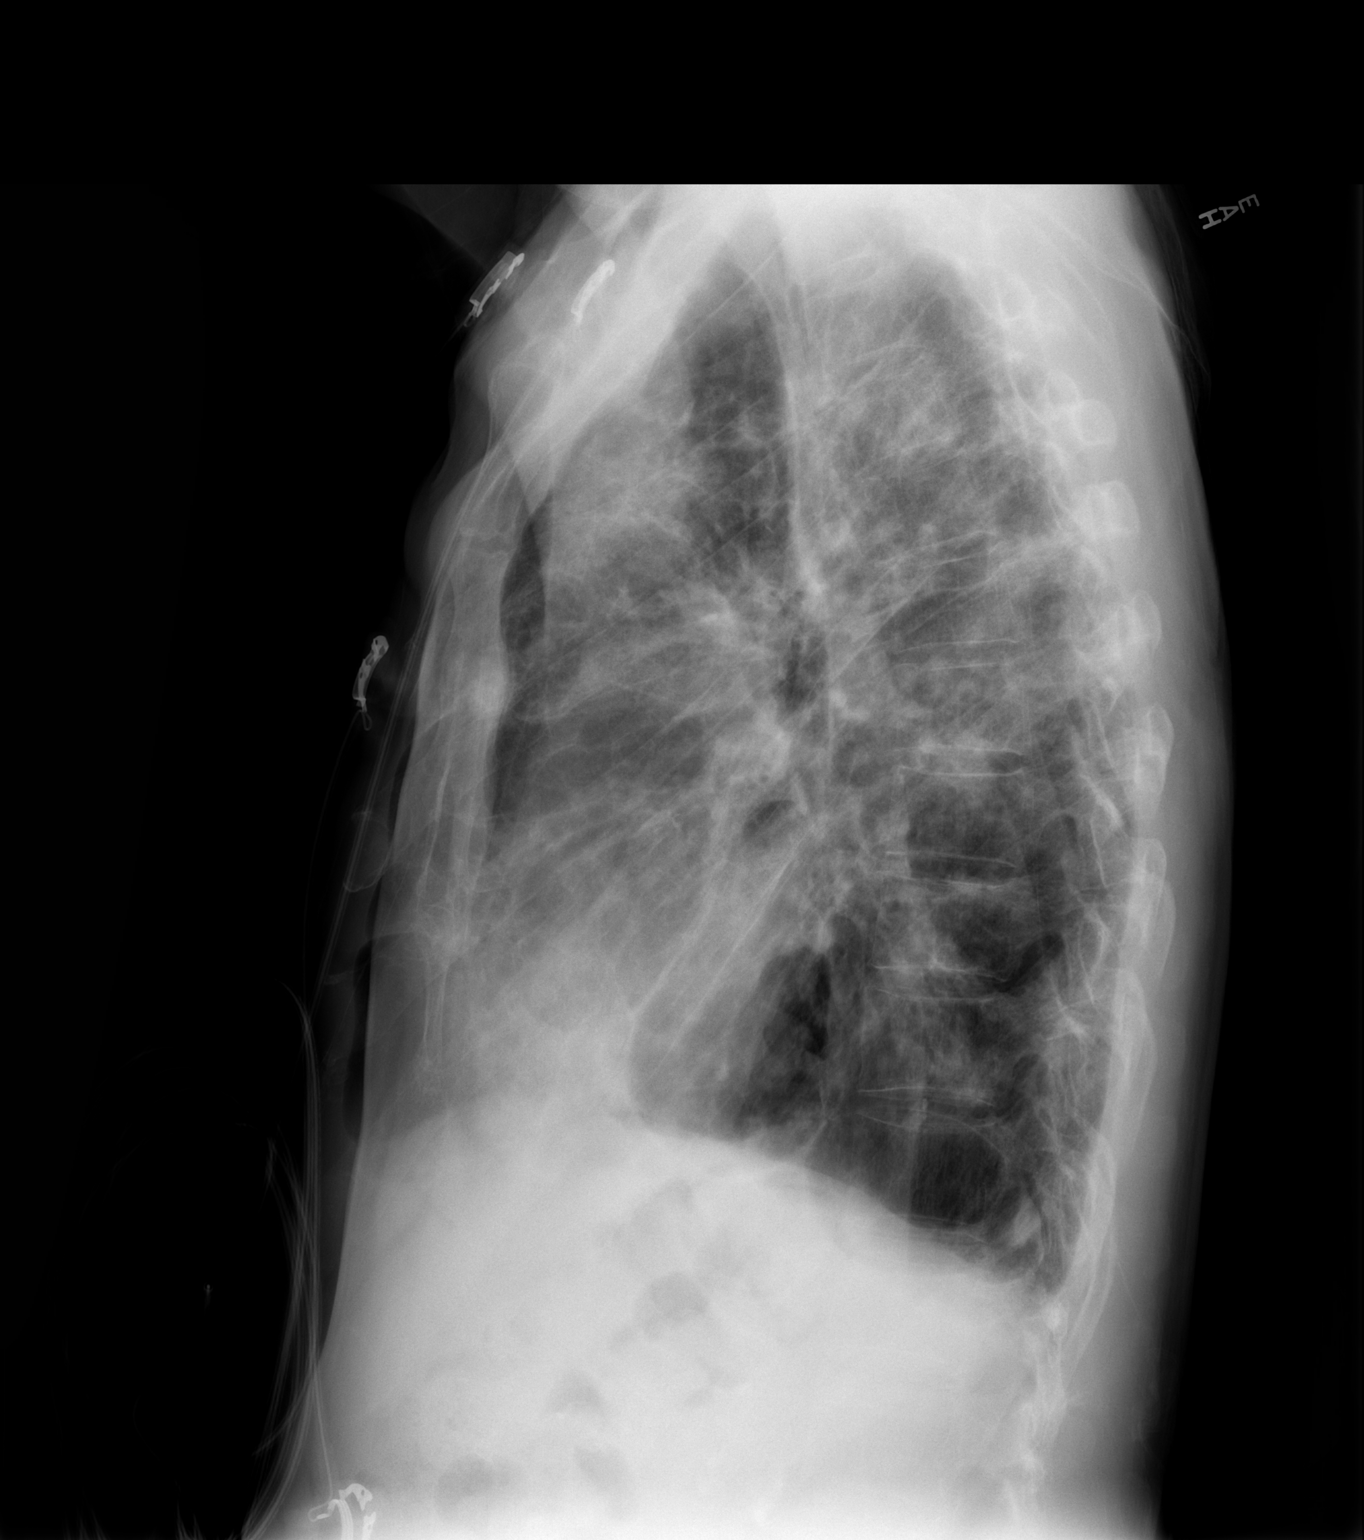

[2 of 2 positions shown; findings below may reference images not displayed]

FINDINGS: Patchy and scattered pulmonary opacities are again
identified.
The cardiomediastinal silhouette is unremarkable.
Tiny bilateral pleural effusions are again noted.
Again identified is a lucency overlying the left lower lung which
may represent a loculated pneumothorax.
No interval change from prior study is noted.
IMPRESSION: Stable chest with possible left basilar loculated pneumothorax.

Unchanged patchy scattered pulmonary opacities and tiny bilateral
pleural effusions.

## 2010-12-12 IMAGING — CR DG CHEST 2V
2 series · 2 of 2 positions shown · non-contrast
Comparison: the previous day's study

CLINICAL DATA: Pneumothorax

CHEST - 2 VIEW

[w chest pa]
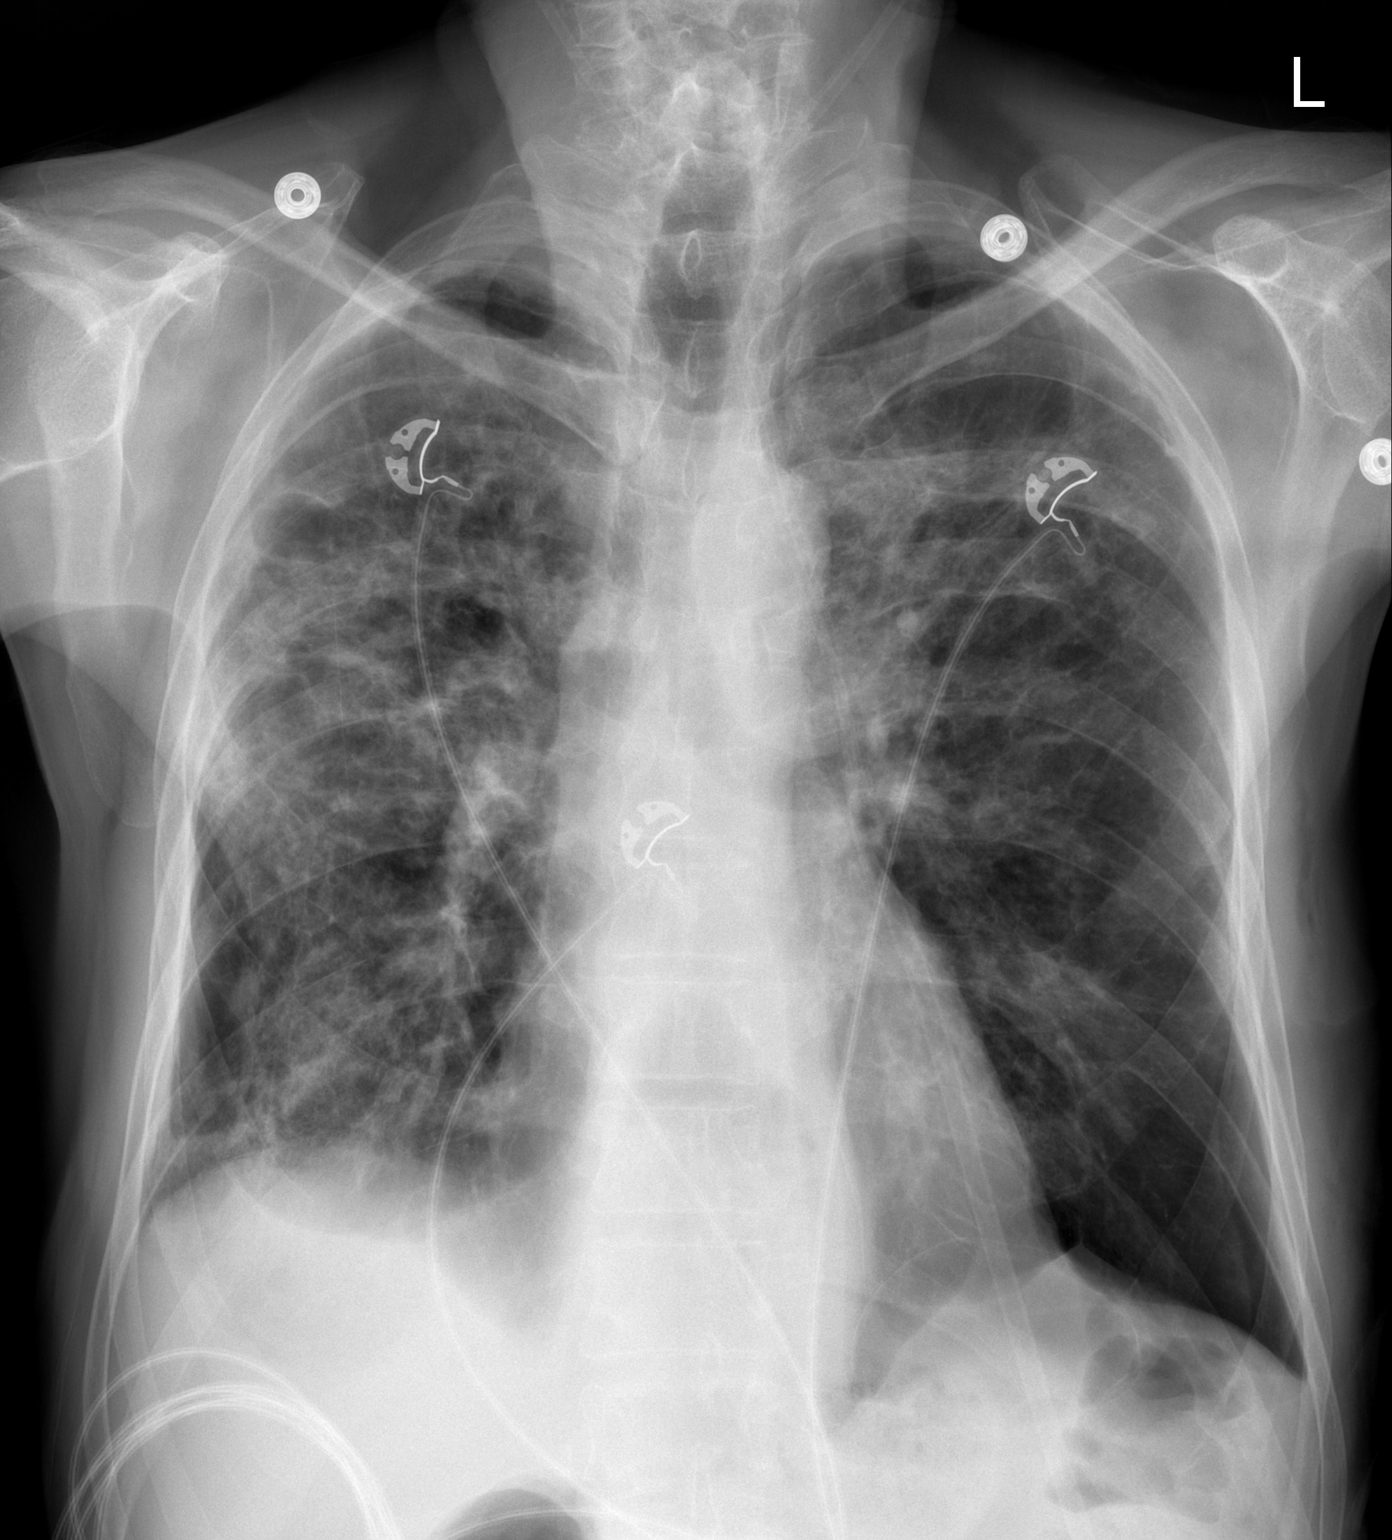

[w chest lat]
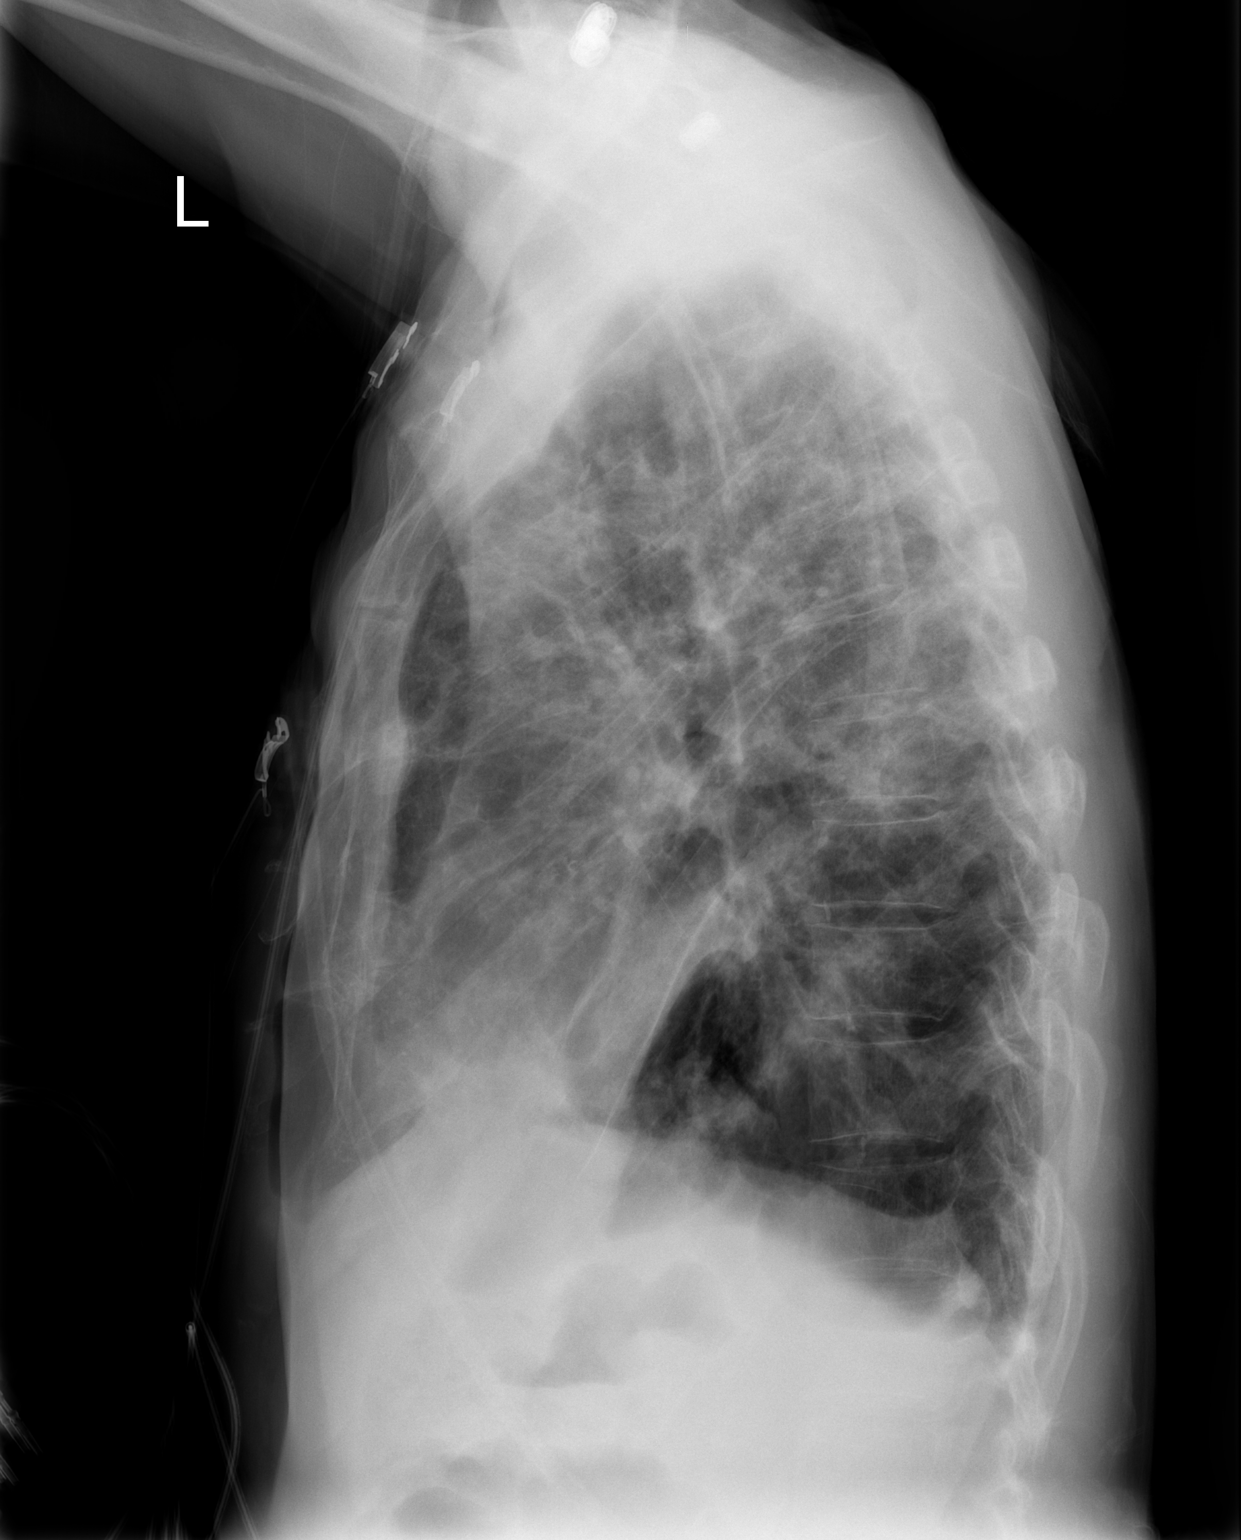

[2 of 2 positions shown; findings below may reference images not displayed]

FINDINGS: Cystic air collection medially at the left lung base
persists.  Somewhat unsharp line laterally at the left lung base
more characteristic of skin fold than lateral pneumothorax.  Coarse
interstitial and airspace opacities throughout both lungs are
stable.  Heart size normal. No effusion.
IMPRESSION: 1.  Little change in cystic air collection medially at the left
lung base.  No definite change from previous exam.

## 2010-12-13 IMAGING — CR DG CHEST 2V
2 series · 2 of 2 positions shown · non-contrast
Comparison: Two-view chest x-ray yesterday and 05/01/2010.
Portable chest x-rays 04/30/2010 and dating back to 04/28/2010.

CLINICAL DATA: Follow up residual left pneumothorax.

CHEST - 2 VIEW 05/03/2010:

[w chest pa]
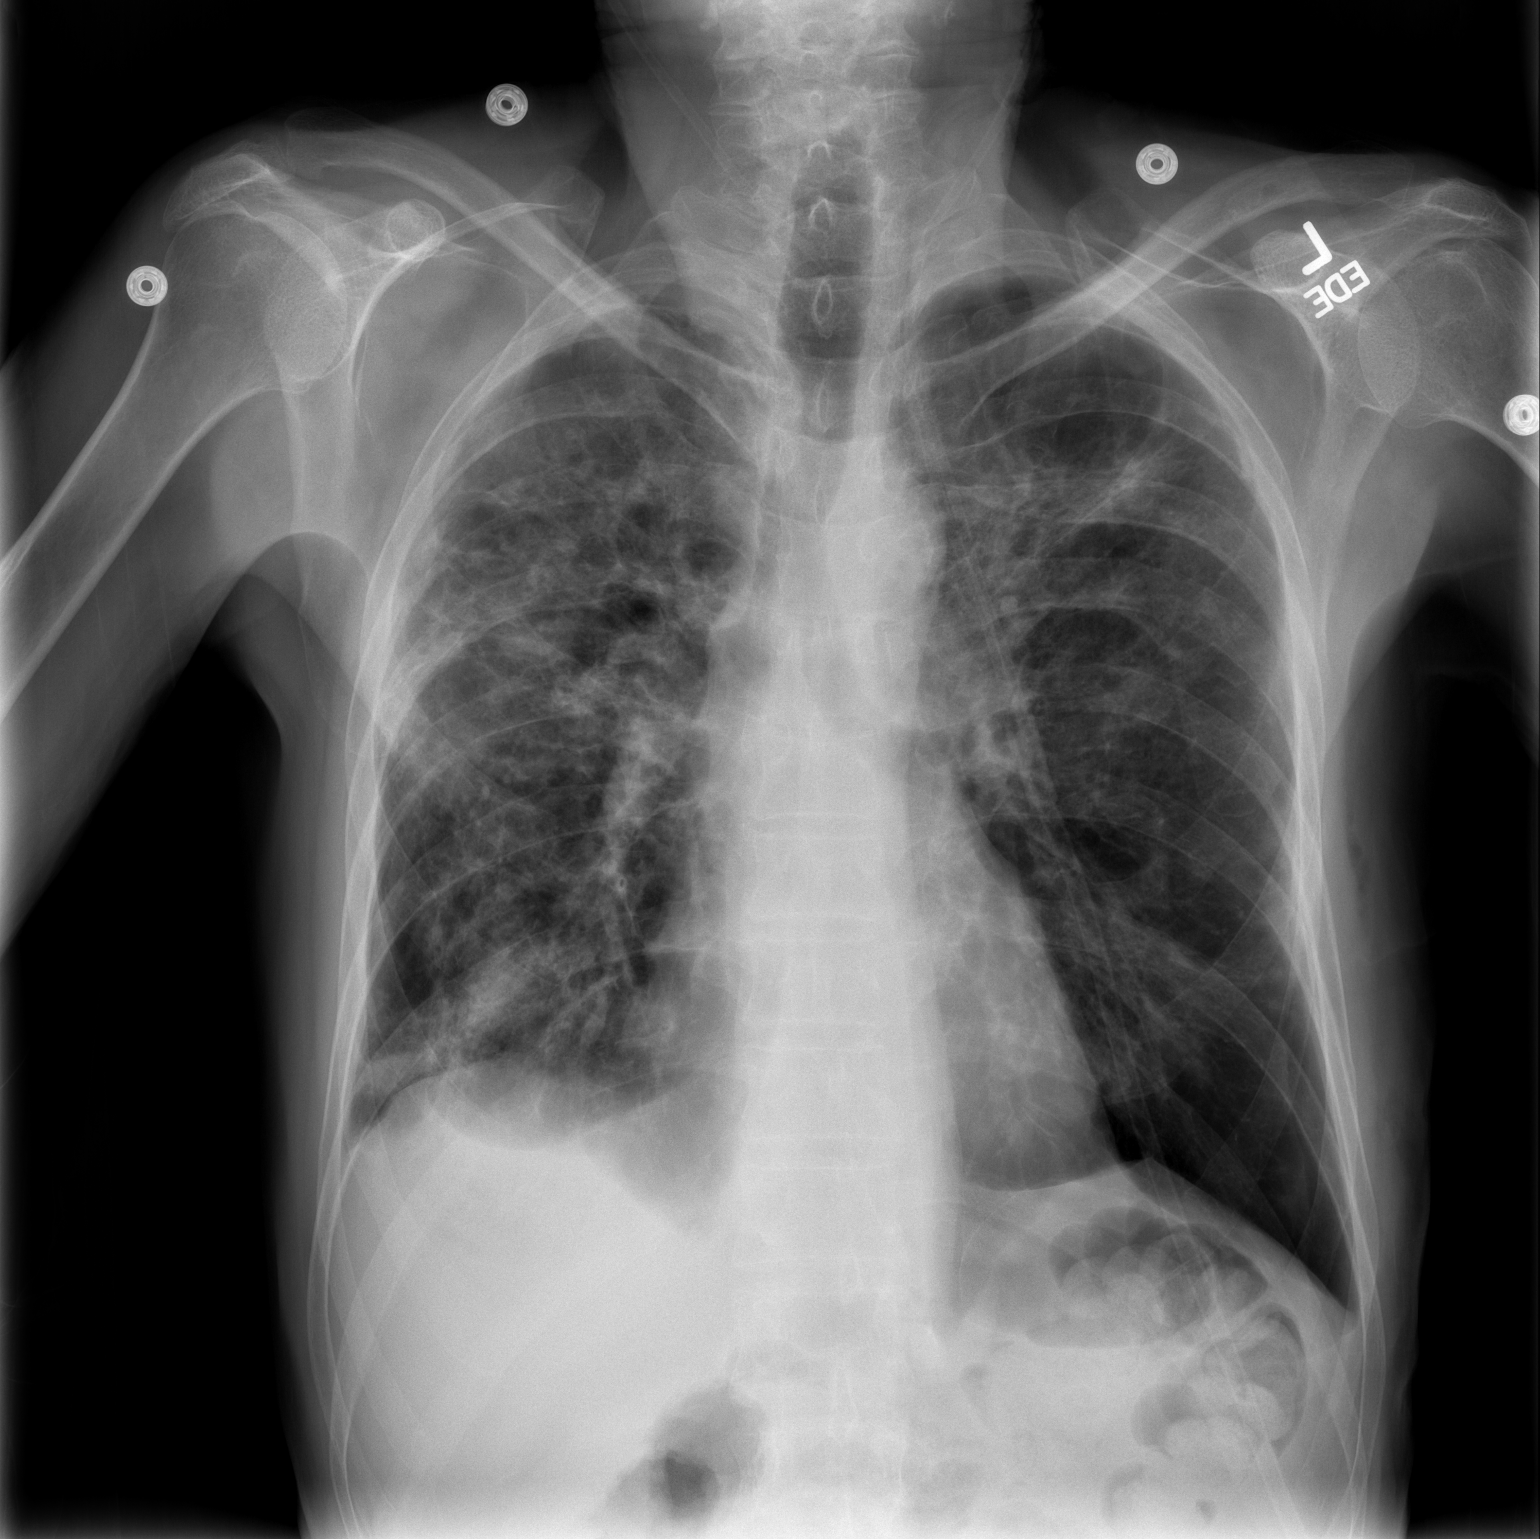

[w chest lat]
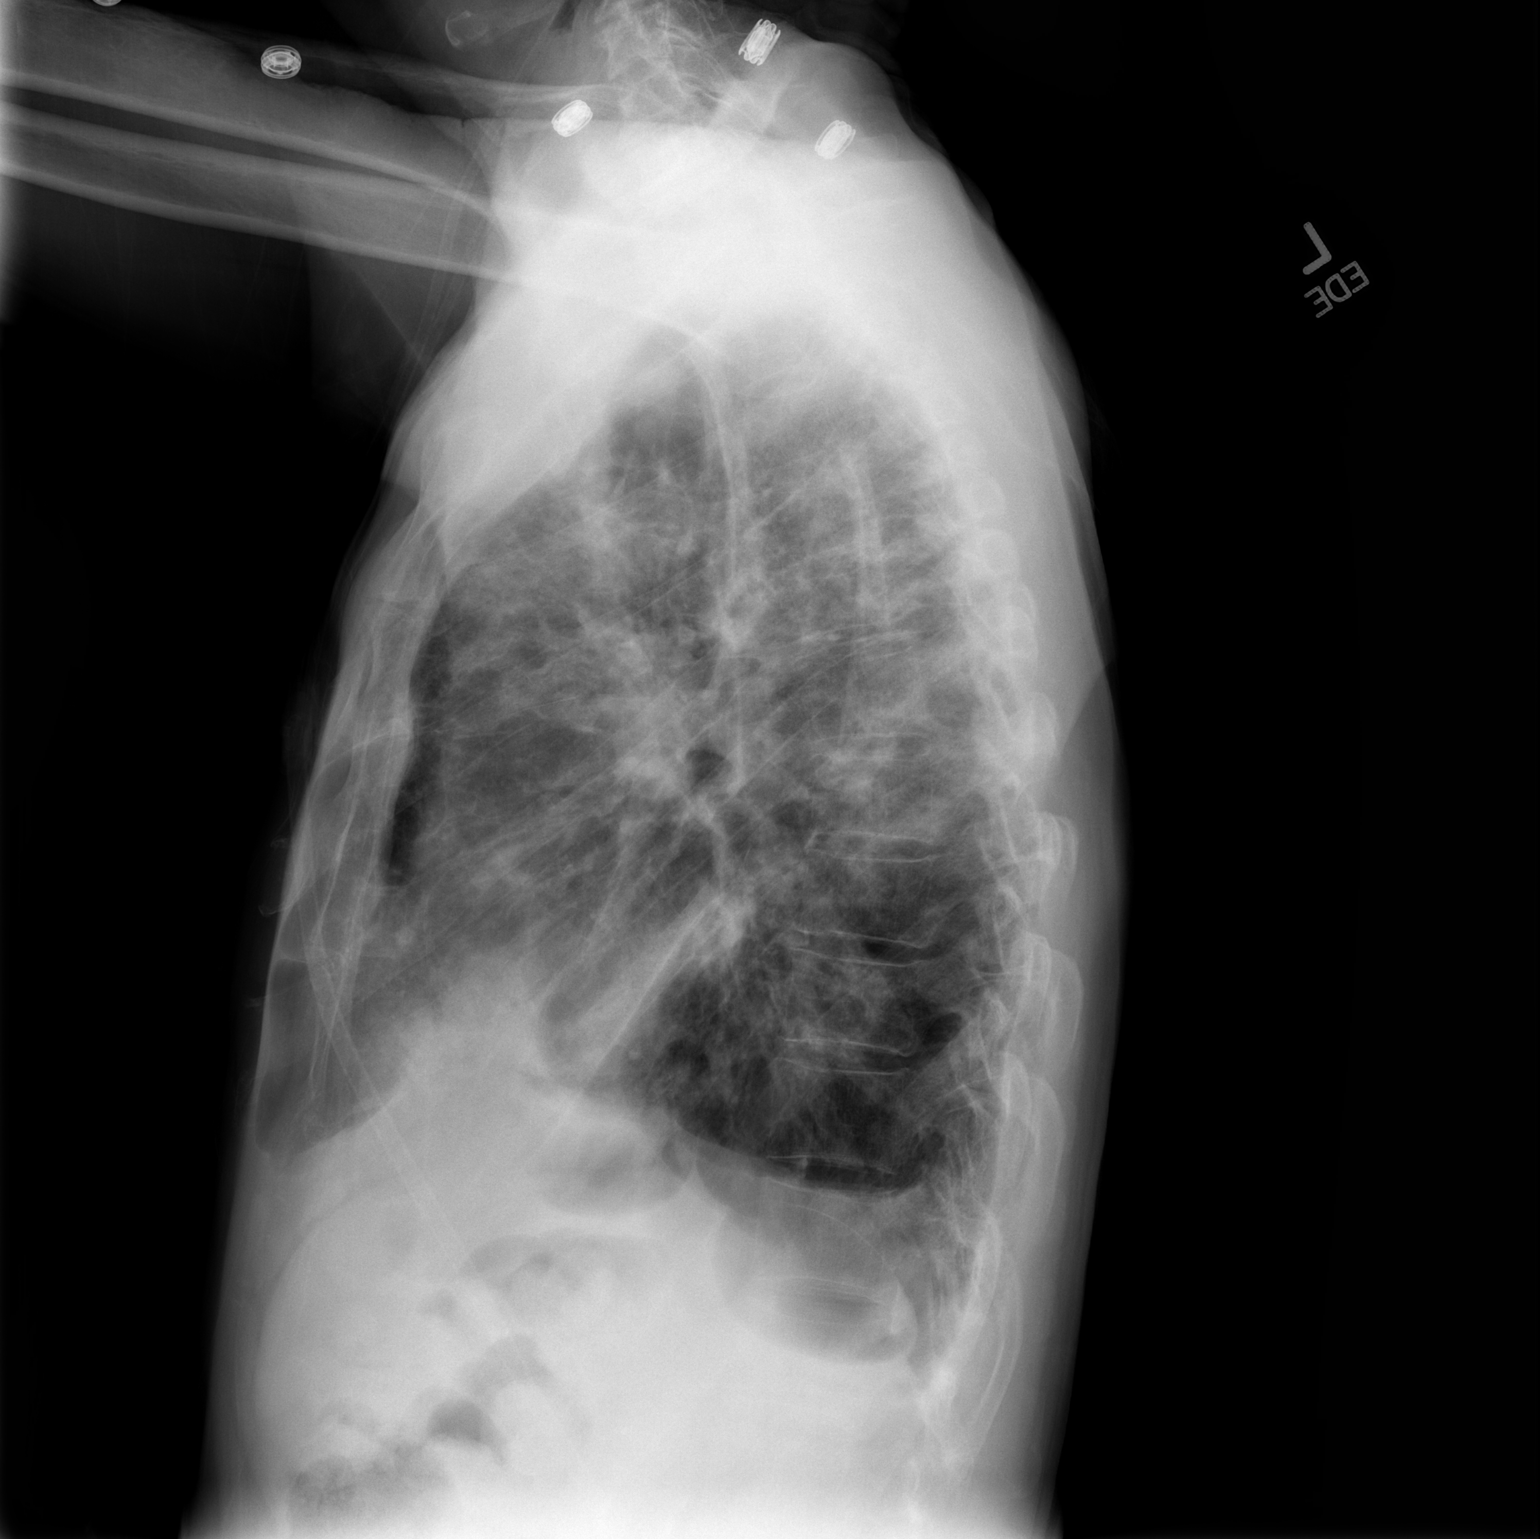

[2 of 2 positions shown; findings below may reference images not displayed]

FINDINGS: Residual small (5% or so) left lateral basilar
pneumothorax, unchanged. No change in the patchy opacities in the
left upper lobe and throughout the right lung.  Stable small right
pleural effusion.  No new pulmonary parenchymal abnormality.
Cardiomediastinal silhouette unremarkable for age.  Visualized bony
thorax intact.
IMPRESSION: Stable small left lateral basilar pneumothorax.  Stable patchy
pneumonia throughout the right lung and in the left upper lobe.
Stable small right pleural effusion.  No new abnormality.

## 2010-12-16 ENCOUNTER — Encounter: Payer: Self-pay | Admitting: Internal Medicine

## 2010-12-16 ENCOUNTER — Ambulatory Visit (INDEPENDENT_AMBULATORY_CARE_PROVIDER_SITE_OTHER): Payer: Medicare Other | Admitting: Internal Medicine

## 2010-12-16 ENCOUNTER — Telehealth: Payer: Self-pay | Admitting: Internal Medicine

## 2010-12-16 ENCOUNTER — Other Ambulatory Visit: Payer: Medicare Other

## 2010-12-16 ENCOUNTER — Other Ambulatory Visit: Payer: Self-pay | Admitting: Internal Medicine

## 2010-12-16 VITALS — BP 112/68 | HR 79 | Temp 98.2°F | Ht 67.0 in | Wt 115.6 lb

## 2010-12-16 DIAGNOSIS — D649 Anemia, unspecified: Secondary | ICD-10-CM

## 2010-12-16 DIAGNOSIS — R0609 Other forms of dyspnea: Secondary | ICD-10-CM

## 2010-12-16 DIAGNOSIS — J479 Bronchiectasis, uncomplicated: Secondary | ICD-10-CM

## 2010-12-16 DIAGNOSIS — M3130 Wegener's granulomatosis without renal involvement: Secondary | ICD-10-CM

## 2010-12-16 MED ORDER — LEVALBUTEROL TARTRATE 45 MCG/ACT IN AERO: INHALATION_SPRAY | RESPIRATORY_TRACT | Status: DC

## 2010-12-16 MED ORDER — PREDNISONE 5 MG PO TABS: ORAL_TABLET | ORAL | Status: DC

## 2010-12-16 NOTE — Patient Instructions (Signed)
Work on inhaler technique:  relax and gently blow all the way out then take a nice smooth deep breath back in, triggering the inhaler at same time you start breathing in.  Hold for up to 5 seconds if you can.  Rinse and gargle with water when done   If your mouth or throat starts to bother you,   I suggest you time the inhaler to your dental care and after using the inhaler(s) brush teeth and tongue with a baking soda containing toothpaste and when you rinse this out, gargle with it first to see if this helps your mouth and throat.    If short of breath go ahead and use the xopenex hfa 2 puffs every 4 hours  Prednisone 5 mg 4 daily until better, then 2 daily x 5 days, then 1 daily thereafter  Please schedule a follow up office visit in 4 weeks, sooner if needed

## 2010-12-16 NOTE — Assessment & Plan Note (Signed)
The goal with a chronic steroid dependent illness is always arriving at the lowest effective dose that controls the disease/symptoms and not accepting a set "formula" which is based on statistics or guidelines that don't always take into account patient  variability or the natural hx of the dz in every individual patient, which may well vary over time.  For now therefore I recommend the patient maintain  For now set ceiling at 20 mg per day and floor at 5 mg

## 2010-12-16 NOTE — Telephone Encounter (Signed)
Pt still c/o cough with discolored sputum. Pt sch to see MW today at 3:30.

## 2010-12-16 NOTE — Progress Notes (Signed)
Subjective:    Patient ID: Richard [name], male    DOB: [birth year], 75 y.o.   MRN: [id number]  HPI 72 yowm never smoker with dx of Longstanding bronchiectasis then dx with WG 03/2010 > requested establish with Richard [name].   July 08, 2009- Bronchiectasis, chronic bronchits, allergic rhinitis  Had flu vax and has had at least 2 pneumovax.   Admit [facility] dx WG by pos c anca 03/2010, supportive tbbx but not vats   Admit [name] dx spont L Ptx 9/08/15/09 requiring Chest tube but no surgery   05/24/10--NP ov/ med reivew. Since last visit. has been hospitalized 05/12/2010-- 10/10/2011for Right pneumothorax., with underlying Wegener granulomatosis. and . Bronchiectasis. Pt was found to have Right Pneumo. Chest tube was placed. with improvement and removed 10/7. Xray on 10/10 w/ resolved pnuemo. He was continued on cytoxan and bactrim for PCP prophylaxis. rec. Prednisone 20 mg reduce to one half daily  Aspirin 81 mg one daily with breakfast but stop for any bleeding  See Patient Care Coordinator before leaving for cardiology appt > saw Dr Richard [name], ? LHC needed   July 21, 2010 ov sob better no longer needing any 02 at all during day, no hemoptysis , producing occ yellow mucus - reports intermittent noct bilateral shoulder pain better with streching, moving, massage. no assoc nausea or sweats. recd try pred 5 mg daily   August 24, 2010 ov cough w/ yellow phlem, breathing is better, using flutter and med calendar consistently. R chest pain with deep breathing no change since R chest tube removed in 05/2010.   October 13, 2010 --Presents for an acute office visit. Complains of productive cough with yellow mucus, sore throat, rattling in chest, low grade temp x3days. Currently on pred 1/2 every other day . OTC not helping. rec continue qod pred, rx with augmentin x 10 days   11/16/2010 ov all smiles, no change on days of prednisone, minimal discolored sputum.   Try off prednisone completely.  If you feel a lot worse  over the next  week(worse breathing/ coughing/ nausea or loss of appetite) resume prednisone 5 mg one half daily until better then return back one half every other day  Late add:  Needs B12 and RBC Folate next ov  11/30/10 phone note PT PHONED STATED THAT HIS SOB IS WORSE HE IS GETTING WEAKER BY THE MINUTE HE REFILLED A RX DOXCYCLINE HE STARTED TAKING THIS OVER THE WEEKEND STATED PREDNISONE BACK ON 4/24 1/2 PILL EVERY OTHER DAY HE CAN BE REACHED    12/16/2010 ov/Richard [name] cc no better on 5mg  dosed one half every other day (not the dose rec), still nauseated, still discolored p doxy complete May 7, sob in shower but not as much walking and no longer using 02, struggling with contingency plans.  Sleeping ok without nocturnal  or early am exac of resp c/o's or need for noct saba. Pt denies any significant sore throat, dysphagia, itching, sneezing,  nasal congestion or excess/ purulent secretions,  fever, chills, sweats, unintended wt loss, pleuritic or exertional cp, hempoptysis, orthopnea pnd or leg swelling.    Also denies any obvious fluctuation of symptoms with weather or environmental changes or other aggravating or alleviating factors.      PMHx:  Allergic rhinitis  GERD- hiatal hernia with reflux  - Repeat Barium Swallow c/w stricture and reflux 03/29/10  Bronchiectasis  - See CT chest 12/14/2009 > progressive bilateral R> L cavities 03/26/10 > clinical dx Wegener's 04/07/10  - PFTs 02/17/10 FEV1 1.52 (  58%) ratio 42 and 15% better after B2, DLC0 118%  WEGENER'S GRANULOMATOSIS  - dX BY pos C anca/ supportive TBBX 04/10/10 > CYTOXAN initiated  - Prednisone tapered off March 2012 > resumed 12/16/2010 daily due to nausea and worse cough PAF/ abn echo during [facility] admit 03/30/10  - ECH0 Aug 23 2011LVH with low nl ef, mild mr, ok R function, peak trop .59  - Not anticoagulated due to WG with Hemoptysis  L PTX 04/25/10  R PTX 05/12/10 > final f/u by [name] 06/01/10  benign hypertrophy of the prostate  Complex  med regimen--Meds reviewed with pt education and computerized med calendar May 25, 2010   Family History:  Mother- deceased age 45;car wreck  Father- deceased age 94; breathing problems;asthma  Brother- living age 34; heart CAD (s/p CABG)  Sibling- deceased age 72; heart (s/p CABG)  Social History:  Retired  Non smoker  Married with 3 children  Risk Factors:  Smoking Status: never (05/27/2010)     Review of Systems     Objective:   Physical Exam   amb thin pleasant wm nad  Baseline wt =130  Wt 110 May 10, 2010 > 120 August 24, 2010 > 125 October 05, 2010 >122 10/13/10 > 122 11/16/2010 > 115 12/16/2010  chronically amb, up on table without assistance.  SKIN: no rash, lesions  NODES: no lymphadenopathy  HEENT: [place]/AT, EOM- WNL, Conjuctivae- clear, PERRLA, TM-WNL, Nose- clear, Throat- clear and wnl, Mallampati III  NECK: Supple w/ fair ROM, JVD- none, normal carotid impulses w/o bruits Thyroid-  CHEST: Mininimal exp rhonchi bilaterally  HEART: RRR, no m/g/r heard  ABDOMEN: Soft and nl;   Walked 3 laps @ 185 ft each stopped due to  End of test s difficulty, sats fine RA       Assessment & Plan:

## 2010-12-16 NOTE — Assessment & Plan Note (Signed)
Continue B complex vit for now

## 2010-12-16 NOTE — Assessment & Plan Note (Signed)
For now continue symbicort 160 Take 2 puffs first thing in am and then another 2 puffs about 12 hours later.    and prn xopenex with mucinex and flutter valve rx as well    Each maintenance medication was reviewed in detail including most importantly the difference between maintenance and as needed and under what circumstances the prns are to be used.  Please see instructions for details which were reviewed in writing and the patient given a copy.  This was done in the context of a medication calendar review which provided the patient with a user-friendly unambiguous mechanism for medication administration and reconciliation and provides an action plan for all active problems. It is critical that this be shown to every doctor  for modification during the office visit if necessary so the patient can use it as a working document. v

## 2010-12-17 LAB — ANCA SCREEN W REFLEX TITER
Atypical p-ANCA Screen: NEGATIVE
c-ANCA Screen: NEGATIVE
p-ANCA Screen: NEGATIVE

## 2010-12-18 LAB — FOLATE: Folate: >20 ng/mL

## 2010-12-20 ENCOUNTER — Other Ambulatory Visit: Payer: Self-pay | Admitting: Adult Health

## 2010-12-20 LAB — METHYLMALONIC ACID, SERUM: Methylmalonic Acid, Quantitative: 319 nmol/L — ABNORMAL HIGH (ref 87–318)

## 2010-12-20 NOTE — Progress Notes (Signed)
Quick Note:  Spoke with pt and notified of results per Dr. [name]. Pt verbalized understanding and denied any questions.  ______ 

## 2010-12-21 ENCOUNTER — Ambulatory Visit: Payer: Medicare Other | Admitting: Internal Medicine

## 2010-12-21 NOTE — Assessment & Plan Note (Signed)
[facility]                             PULMONARY OFFICE NOTE   NAME:[name], Richard MONTMINY                     MRN:          [id number]  DATE:05/29/2007                            DOB:          1935/10/25    PROBLEMS:  1. Chronic bronchitis/bronchiectasis.  2. Seasonal allergic rhinitis.  3. Hiatal hernia with reflux.   HISTORY:  Phenergan with codeine cough syrup helps him sleep.  He still  complains of fluid in his chest and need to cough.  Maintenance  prednisone 10 mg daily had no effect.  We have discussed steroids again  and are stopping his steroid therapy now.  He expects to feel good for  3-4 weeks after a course of antibiotics and we have again discussed  maintenance strategies.  He is unclear about his pneumococcal vaccine  status and is going to check with Dr. Charmian Muff records, but does want  a flu shot.  No fevers or chills.   MEDICATION:  A course of doxycycline was completed October 9.  He is now  on:  1. Spiriva once daily.  2. AcipHex 20 mg.  3. Flomax 0.5 mg.  4. Aspirin 81 mg.  5. Avodart 0.5 mg.  6. Mucinex.  7. Singulair 10 mg.  8. He has an albuterol rescue inhaler.  9. Phenergan with codeine cough syrup.   No medication allergy.   OBJECTIVE:  Weight 142 pounds, BP 132/72, pulse 72, room air saturation  96%.  He is trim, alert, no obvious distress.  Heart sounds are regular  and normal.  I find no adenopathy, cyanosis, clubbing or edema.  There  is a congested quality to his laughter.  Nonproductive cough.  No  distinct rhonchi or rales are heard with quiet breathing.  Work of  breathing is not increased.   IMPRESSION:  Chronic obstructive pulmonary disease with a bronchiectatic  component.  There are concerns related to long-term antibiotic exposure  and these have been again discussed with him very carefully.   PLAN:  1. Discontinue prednisone.  2. Omnicef 300 mg x2 daily for 10 days, with refills for cautious  use      as discussed with him.  3. Schedule return 4 months, earlier p.r.n.  4. Flu vaccine discussed and given.     Clinton D. Maple Hudson, MD, Tonny Bollman, FACP  Electronically Signed    CDY/MedQ  DD: 05/29/2007  DT: 05/30/2007  Job #: [id number]   cc:   Ellin Saba., MD

## 2010-12-21 NOTE — Assessment & Plan Note (Signed)
[facility]                             PULMONARY OFFICE NOTE   NAME:Richard [name], Richard [name]                     MRN:          [id number]  DATE:03/21/2007                            DOB:          01/28/1936    PROBLEM LIST:  1. Chronic bronchitis/bronchiectasis.  2. Seasonal allergic rhinitis.  3. Hiatal hernia with reflux.   HISTORY:  Had some chest pain in July which resolved spontaneously.  He  is not sure if Augmentin aggravated heartburn.  He is still concerned  about weight loss with poor appetite, malaise, increased cough, yellow  sputum.  I suspect that we might find evidence of atypical Mycobacteria  but sputum was negative for AFB.  His PFT in May, had shown moderate  COPD.  His chest x-ray from April had shown COPD with chronic bronchitis  and chronic interstitial changes and a small hiatal hernia.   MEDICATIONS:  1. Spiriva.  2. Aciphex 20 mg.  3. Flomax 0.4 mg.  4. Aspirin 81 mg.  5. Avodart 0.5 mg.  6. Mucinex.  7. Singulair 10 mg.  8. Pulmicort.  9. Albuterol inhaler not being used routinely.   No medication allergy.   OBJECTIVE:  VITAL SIGNS:  Weight 130 pounds down from 141 pounds in  April, BP 12/78, pulse 75, room air saturation 95%.  GENERAL:  Trim,  slender gentleman, no adenopathy.  LUNGS:  Trace wheeze in the bases especially on the left.  HEART:  Sounds regular without murmur.  Heart sounds unremarkable.  ABDOMEN:  No hepatosplenomegaly.   IMPRESSION:  1. Chronic obstructive pulmonary disease with bronchiectasis.  2. Weight loss is of concern and I am not sure if this is just the      result of antibiotics.   PLAN:  1. Try prednisone 10 mg daily for antiinflammatory effect.  A bonus      would be some weight gain.  2. He can stop his Pulmicort inhaler while he is on prednisone.  3. Schedule return 2 months, earlier p.r.n.  4. I asked him to touch base with Dr. Scotty Court also about his weight      loss.     Clinton D. Maple Hudson, MD, Tonny Bollman, FACP  Electronically Signed    CDY/MedQ  DD: 03/24/2007  DT: 03/25/2007  Job #: [id number]   cc:   Ellin Saba., MD

## 2010-12-21 NOTE — Assessment & Plan Note (Signed)
OFFICE VISIT   Richard [name], Richard [name]  DOB:  1936-01-14                                        June 01, 2010  CHART #:  [id number]   HISTORY:  The patient is a 75 year old gentleman who was recently  hospitalized and underwent video fiberoptic bronchoscopy with  electromagnetic navigation, transbronchial needle biopsies,  transbronchial brushings, transbronchial forceps biopsies, and  bronchoalveolar lavage.  This was done by Dr. [name] and Dr. [name] on  April 07, 2010.  He subsequently had some difficulties with bilateral  pneumothoraces and we are seeing him today in followup.  He was last  hospitalized May 12, 2010, for followup of his left-sided  pneumothorax.  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  He is  being managed by Dr. [name] and colleagues.  He is still on Cytoxan as  well as prednisone and Bactrim.  He overall feels like he is breathing  more comfortably.  He denies fevers, chills or other constitutional  symptoms.  He has some mild difficulties with his right eye, blurred  vision primarily, during the hospitalization and reports that currently  it is much better but he has not yet followed up with Ophthalmology.   Chest x-ray was obtained on today's date.  It appears to show some  improvement in the airspace disease.  There are no significant new  findings.  There is no evidence of pneumothorax.   PHYSICAL EXAMINATION:  Vital Signs:  Blood pressure is 114/67, pulse 86,  respirations 18, oxygen saturation 99% on room air.  General Appearance:  Well-developed adult male in no acute distress.  Pulmonary examination  reveals bilateral basilar scattered wheeze and rhonchi.  The rhonchi  cleared pretty well with cough.  Cardiac:  Regular rate and rhythm.  Normal S1-S2.   ASSESSMENT:  The patient is stable from a surgical viewpoint.  He will  continue his aggressive pulmonary management by his medical physicians.  At this time there  is no need to  see him again in our office, but certainly we would be happy to should  any surgical issues arise at any time.   Rowe Clack, P.A.-C.   Sherryll Burger  D:  06/01/2010  T:  06/02/2010  Job:  [id number]   cc:   Leslye Peer, MD

## 2010-12-21 NOTE — Assessment & Plan Note (Signed)
OFFICE VISIT   KENSON, GROH  DOB:  [birth year]                                        May 12, 2010  CHART #:  [id number]   The patient returns today for follow up of his left chest tube which we  handled, although we did not insert the chest.  He has a history of  Wegener granulomatosis.  He comes in today more short of breath, and a  chest x-ray shows right basilar pneumothorax.  I have sent him to the  emergency room to be seen by Dr. Rory Percy and he will be  admitted by the Pulmonary Service.  He did complain of some episode of  questionable blindness in his right eye and I told him to be sure until  Dr. Tyson Alias see that so that can be addressed.   Ines Bloomer, M.D.  Electronically Signed   DPB/MEDQ  D:  05/12/2010  T:  05/13/2010  Job:  [id number]

## 2010-12-21 NOTE — Assessment & Plan Note (Signed)
[facility]                             PULMONARY OFFICE NOTE   NAME:Sar, SERGEY ISHLER                     MRN:          [id number]  DATE:01/19/2007                            DOB:          June 22, 1936    PROBLEMS:  1. Chronic bronchitis/bronchiectasis.  2. Seasonal allergic rhinitis.  3. Hiatal hernia with reflux.   HISTORY:  He always is sensitive to changes in air quality and we  discussed the current environmental issues. He usually has some  productive cough and variable amounts of yellow sputum. Sometimes it  tastes bad. Culture on April 30th returned normal flora and sputum for  acid fast bacilli has now returned negative on culture.   MEDICATIONS:  1. Spiriva.  2. Aciphex 20 mg.  3. Flomax 0.5 mg.  4. Aspirin 81 mg.  5. Avodart 0.5 mg.  6. Mucinex.  7. Singulair 10 mg.   ALLERGIES:  No medication allergy.   OBJECTIVE:  Weight 136 pounds, blood pressure 128/74, pulse 78, room air  saturation 97%. Some mild bilateral congestion is evident when he takes  a deep breath, otherwise his lungs sound clear with quiet breathing.  Heart sounds are regular, there is no murmur or rub. No adenopathy.  Speech quality is normal. No edema. Pulmonary function tests on May 14  showed moderate obstructive airways disease with minimal response to  bronchodilator. FEV1 was 1.90 (70% predicted) with FEV1/FVC ratio of  0.50. Measured lung volumes and diffusion capacity were normal. On a six  minute walk test, he went 459 meters without stopping. He was  complaining of bilateral leg pain at the end of that, but he maintained  oxygenation, heart rate, and blood pressure. Oxygen saturation went from  96% at the beginning to 98% at the end, and two minutes later was 97% on  room air indicating satisfactory oxygenation not limiting his exercise  tolerance.   IMPRESSION:  1. Chronic bronchitis/bronchiectasis/chronic obstructive pulmonary      disease.  2.  History of reflux which likely has aggravated his bronchitis in the      past.   PLAN:  1. Continue reflux precautions.  2. Try Pulmicort 1 puff b.i.d. with adequate two samples for two      months trial.  3. Schedule return 2 months, earlier p.r.n.     Clinton D. Maple Hudson, MD, Tonny Bollman, FACP  Electronically Signed    CDY/MedQ  DD: 01/20/2007  DT: 01/21/2007  Job #: [id number]   cc:   Ellin Saba., MD

## 2010-12-22 IMAGING — CR DG CHEST 1V PORT
1 series · 1 of 1 positions shown · non-contrast
Comparison: Earlier the same day

CLINICAL DATA: Chest tube placement

PORTABLE CHEST - 1 VIEW

[view not recorded]
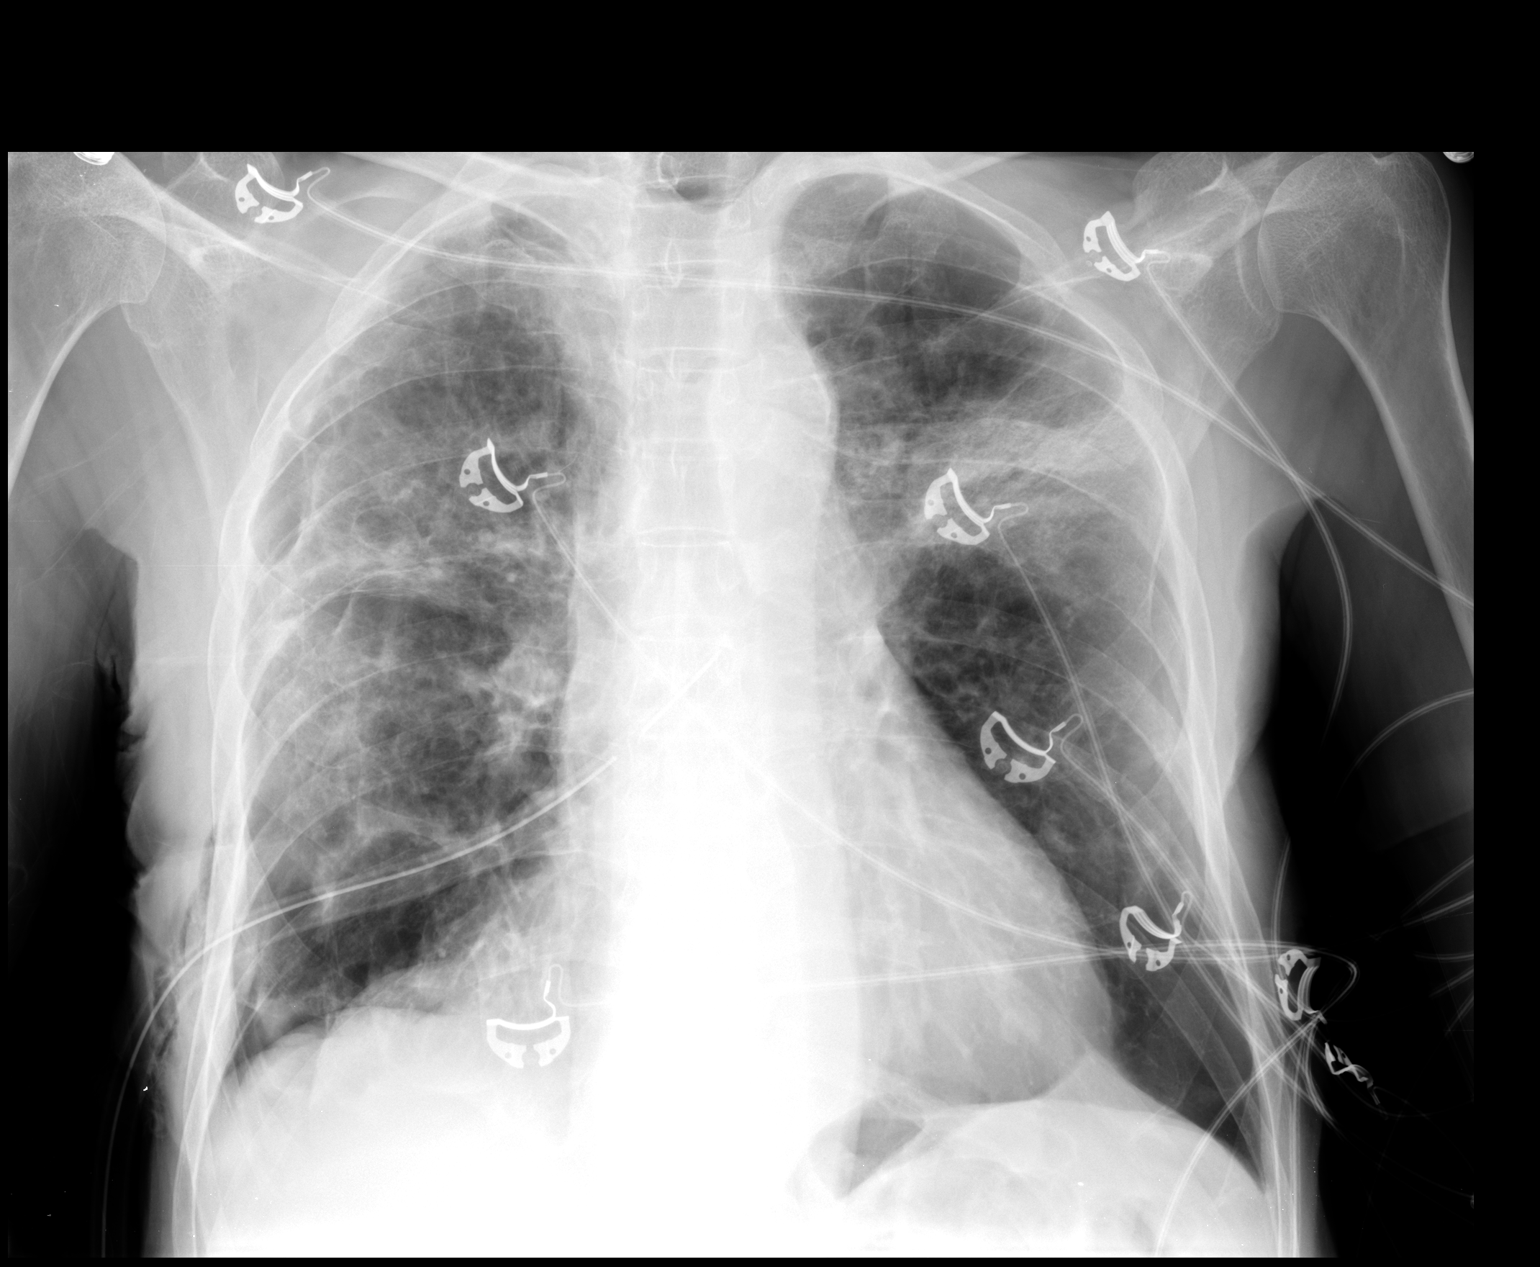

[1 of 1 positions shown; findings below may reference images not displayed]

FINDINGS: 9266 hours.  Right chest tube has been placed in the
interval.  The tip overlies the left T7 pedicle suggesting that it
is in the anterior chest, likely along the anteromedial pleura.
The basilar pneumothorax seen on the previous study has decreased
in the interval.  The patient has bilateral underlying parenchymal
lung disease with focal airspace disease in the left upper lung, as
seen on the film earlier today.
IMPRESSION: Status post right chest tube placement with tip overlying the
midline.  The pleural air volume on the right appears decreased in
the interval.

## 2010-12-22 IMAGING — CR DG CHEST 2V
2 series · 2 of 2 positions shown · non-contrast
Comparison: 05/03/2010

CLINICAL DATA: Follow-up pneumothorax

CHEST - 2 VIEW

[view not recorded (1 of 2)]
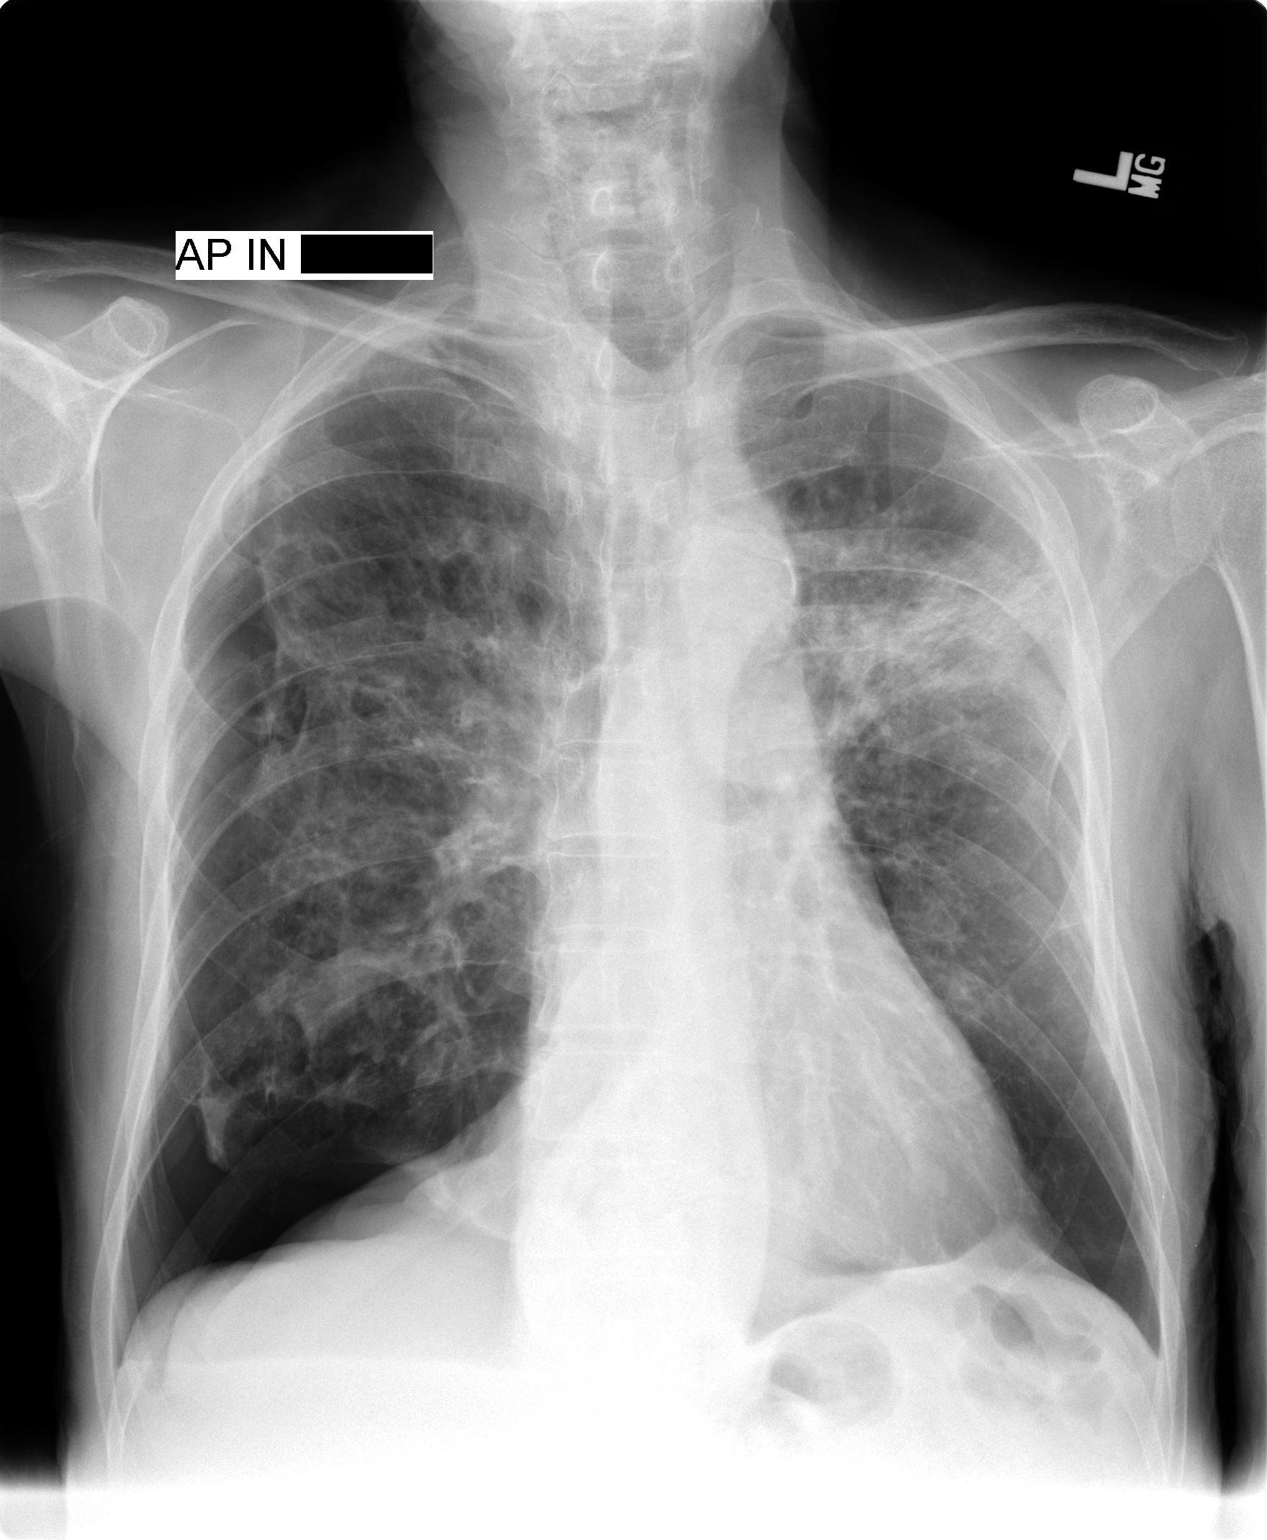

[view not recorded (2 of 2)]
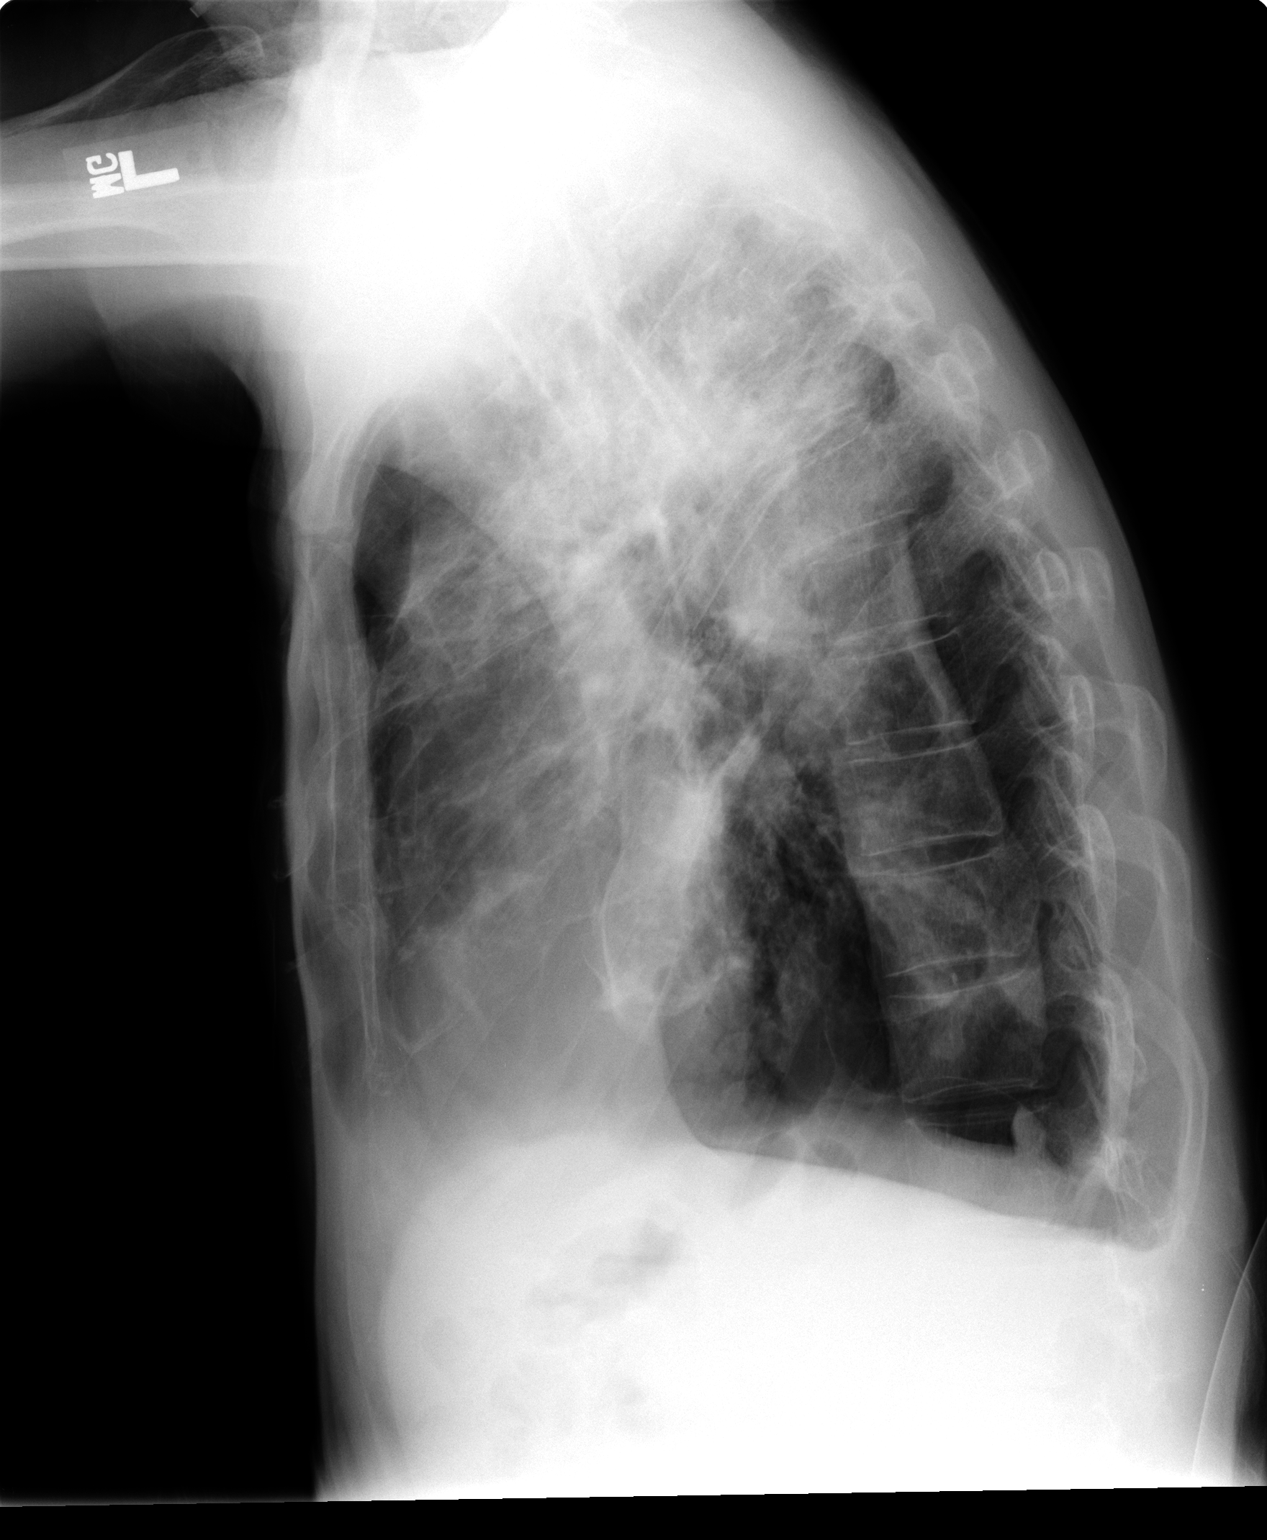

[2 of 2 positions shown; findings below may reference images not displayed]

FINDINGS: The lungs are hyperexpanded and there is an enlarging
pneumothorax seen at the right lung base with approximately 5 cm of
pleuroparenchymal displacement.  No mediastinal shift is seen. No
left pneumothorax is present today. Chronic changes are seen in the
lungs.  There is interval appearance of a new ill-defined opacity
in the left upper lung. The heart, bones and upper abdomen are
unchanged.
IMPRESSION: Large right basilar pneumothorax without evidence of underlying
tension physiology.  New left upper lobe airspace disease
concerning for infection.

Critical test results telephoned to Dr. Okuta at the time of
interpretation on [REDACTED] at 15 30.

## 2010-12-23 ENCOUNTER — Telehealth: Payer: Self-pay | Admitting: Internal Medicine

## 2010-12-23 IMAGING — CR DG CHEST 1V PORT
1 series · 1 of 1 positions shown · non-contrast
Comparison: Chest radiograph 05/12/2010

CLINICAL DATA: Pneumothorax

PORTABLE CHEST - 1 VIEW

[view not recorded]
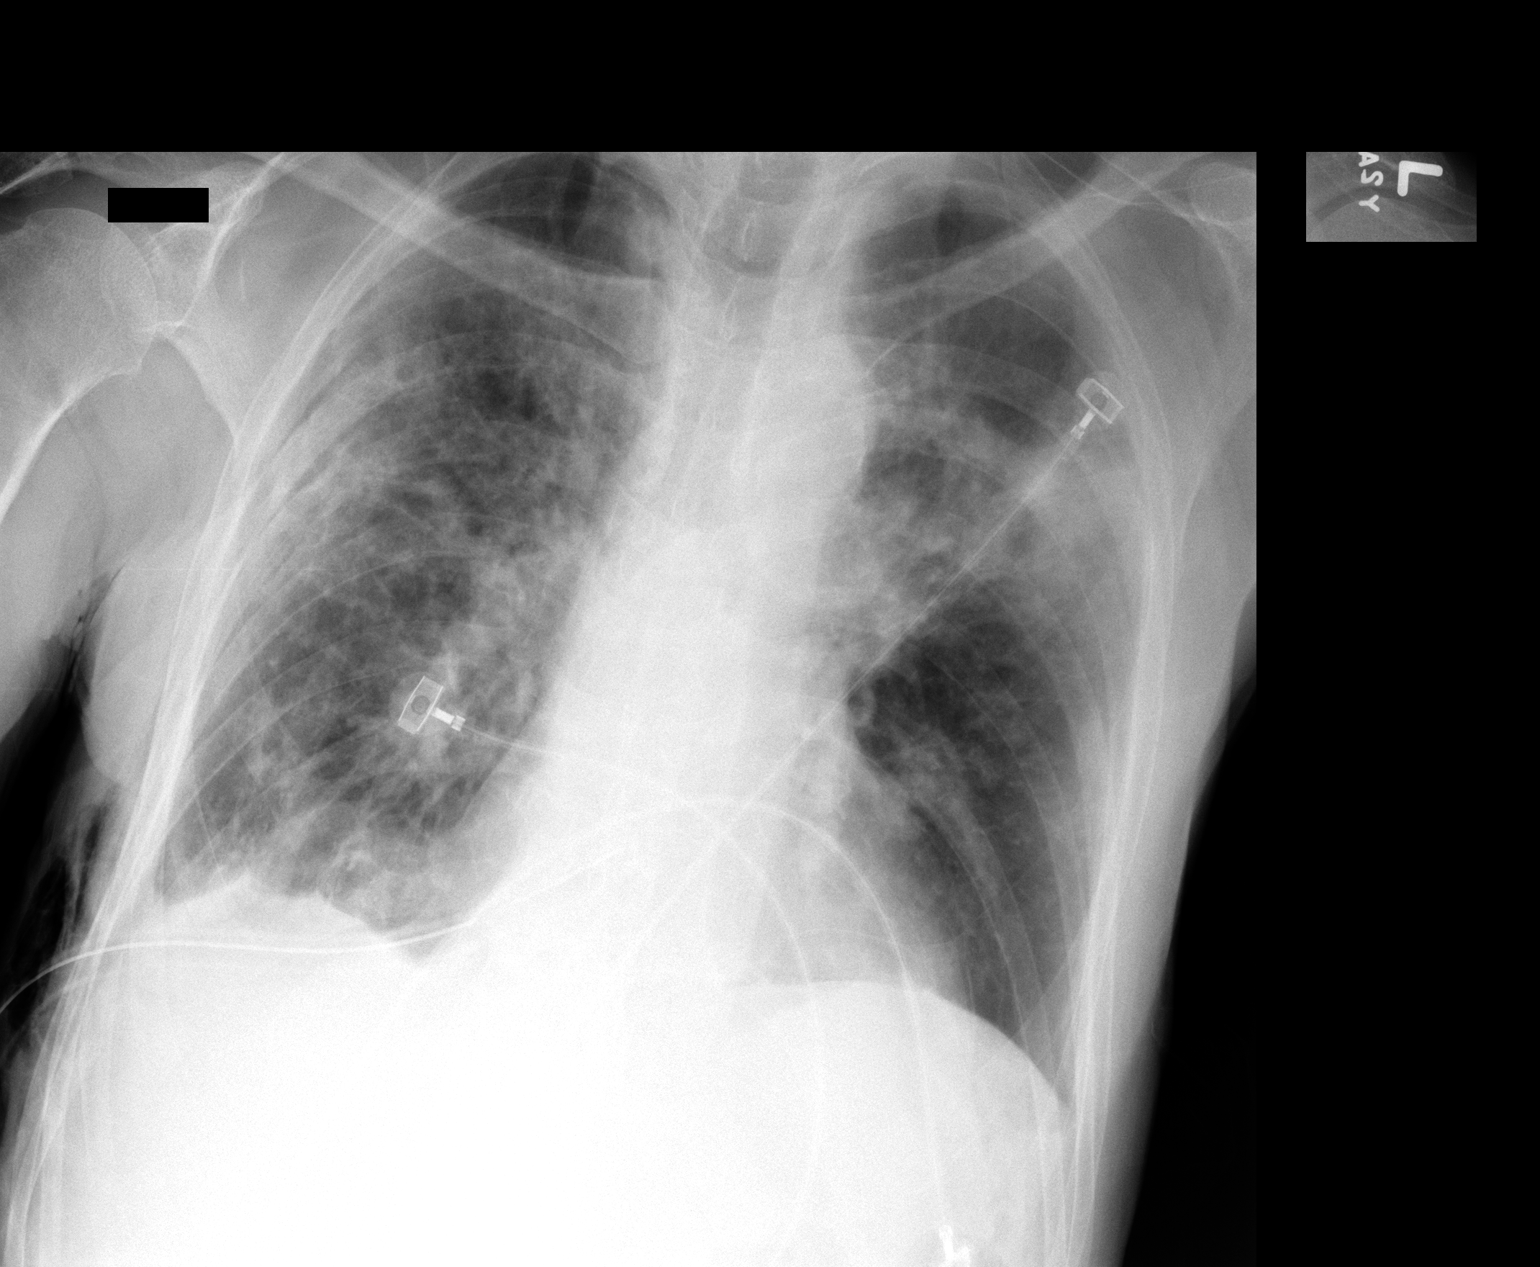

[1 of 1 positions shown; findings below may reference images not displayed]

FINDINGS: Right chest tube remains in place.  No evidence of right
pneumothorax.  There is underlying chronic interstitial lung
disease and potential superimposed upper lobe bilateral air space
disease.  Normal cardiac silhouette.
IMPRESSION: 1.  Right chest tube in place with no evidence of pneumothorax.
2.  Upper lobe air space disease superimposed on chronic lung
disease.

## 2010-12-23 MED ORDER — BUDESONIDE-FORMOTEROL FUMARATE 160-4.5 MCG/ACT IN AERO: 2.0000 | INHALATION_SPRAY | Freq: Two times a day (BID) | RESPIRATORY_TRACT | Status: DC

## 2010-12-23 NOTE — Telephone Encounter (Signed)
Spoke w/ pt and he is requesting refill on his symbicort 160 and send it to [facility]. Advised pt would get rx sent for him. Nothing further was needed

## 2010-12-24 IMAGING — CR DG CHEST 1V PORT
1 series · 1 of 1 positions shown · non-contrast
Comparison: Portable chest x-ray of 05/13/2010

CLINICAL DATA: Pneumothorax, shortness of breath

PORTABLE CHEST - 1 VIEW

[view not recorded]
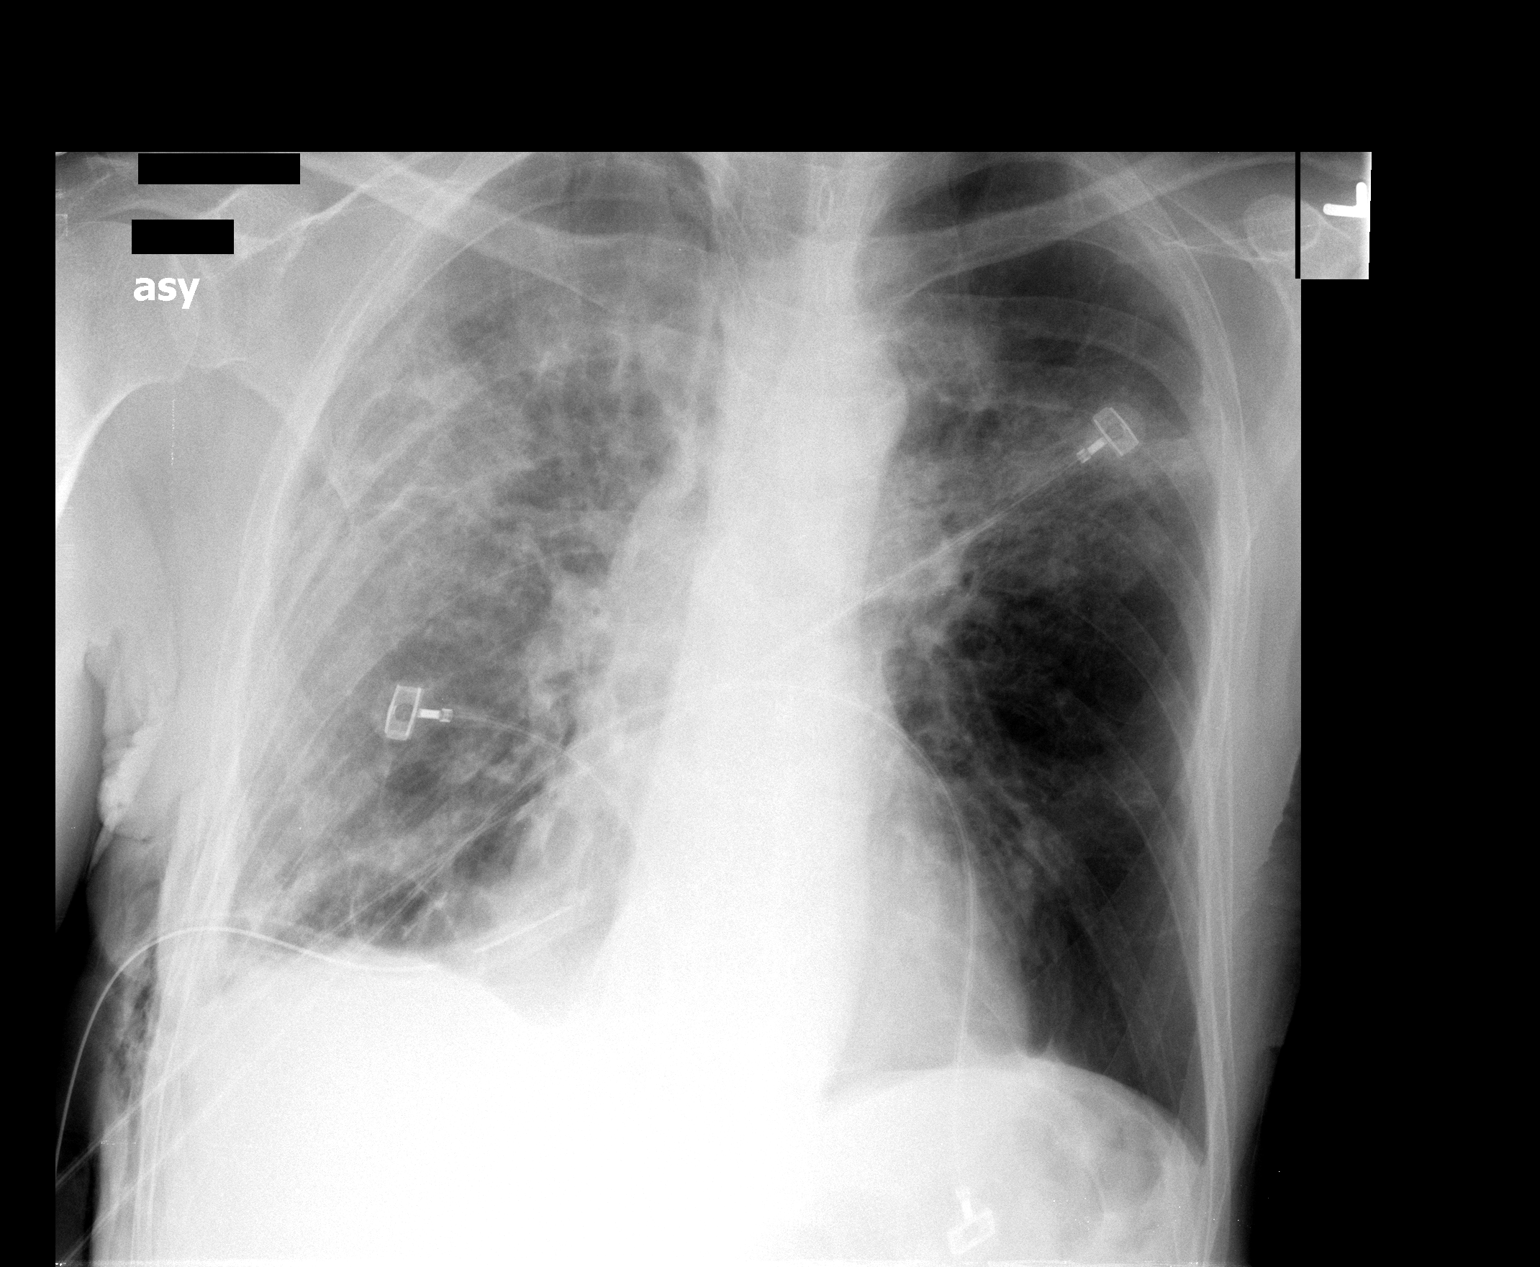

[1 of 1 positions shown; findings below may reference images not displayed]

FINDINGS: The lungs remain hyperaerated.  Patchy airspace disease
right greater than left remains size is stable.  Right chest tube
is present and no definite right pneumothorax is noted.
IMPRESSION: No change in patchy airspace disease right greater than left.
Right chest tube remains.

## 2010-12-24 NOTE — Assessment & Plan Note (Signed)
[facility]                             PULMONARY OFFICE NOTE   NAME:Laprade, Richard [name]                     MRN:          [id number]  DATE:11/22/2006                            DOB:          08/16/1935    PROBLEM:  1. Chronic bronchitis/bronchiectasis.  2. Seasonal allergic rhinitis.  3. Hiatal hernia with reflux.   HISTORY:  Last seen over a year ago.  He has gotten antibiotics p.r.n.  from Dr. Scotty Court.  We discussed maintenance antibiotics and some  previous strategies as well as his previous history of documented  esophageal reflux as a likely aggravating factor in this never smoker.  He says Flutter valve never helped.  He can not stay clear in his chest.  With antibiotics he will clear for about 2 weeks, but then he resumes  coughing productively with sputum usually yellow.  He thinks that he  gotten a bit more easily dyspneic with exertion over the last year or  so.  No recent fever.   MEDICATIONS:  1. Spiriva.  2. AcipHex 20 mg.  3. Flomax 4 mg.  4. aspirin.  5. Citrucel.  6. Avodart.  7. Mucinex.  8. Singulair.  He has had a Azmacort inhaler, not routinely used.   ALLERGIES:  No medication allergy.   OBJECTIVE:  Weight 141 pounds, blood pressure 142/86, pulse 73, room air  saturation 95%.  Neat and trim, no adenopathy, cyanosis, clubbing or edema.  His lung fields actually sound clear right now.  Listening specifically,  I do not hear crackles in the bases.  Heart sounds are regular without murmur.   IMPRESSION:  Known bronchiectasis which will not be clear on a chronic  basis.  We will have concerns about resistant organisms and the  likelihood of repeated airway soiling from reflux events.   PLAN:  Reflux precautions re-emphasized.  Chest x-ray, schedule  pulmonary function test including 6 minute walk test, sputum for routine  and AFB culture, schedule return in one month, earlier if p.r.n.     Clinton D. Maple Hudson, MD,  Tonny Bollman, FACP  Electronically Signed    CDY/MedQ  DD: 11/22/2006  DT: 11/22/2006  Job #: [id number]   cc:   Ellin Saba., MD

## 2010-12-25 IMAGING — CR DG CHEST 1V PORT
1 series · 1 of 1 positions shown · non-contrast
Comparison: 05/15/2010

CLINICAL DATA: Chest tube removal.  Pneumothorax.

PORTABLE CHEST - 1 VIEW

[AP]
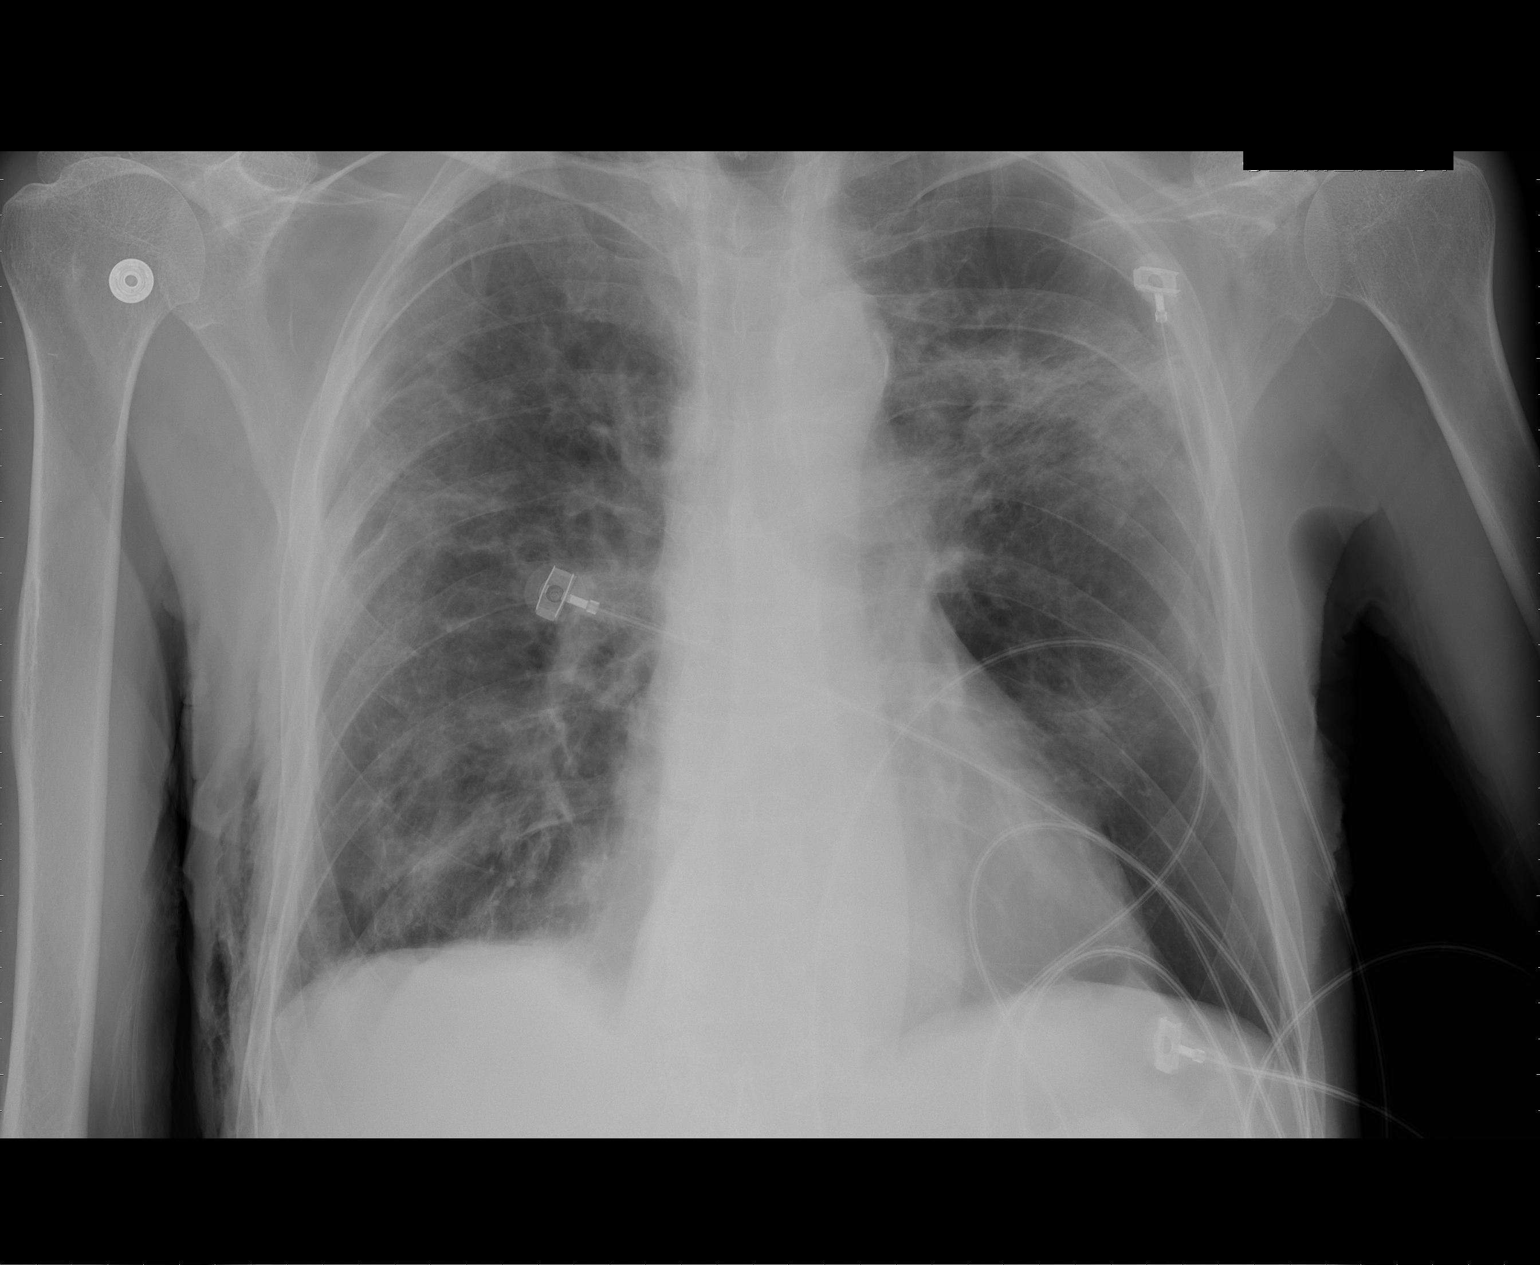

[1 of 1 positions shown; findings below may reference images not displayed]

FINDINGS: Right chest tube has been removed in the interval.  No
evidence for residual right pneumothorax.  Diffuse interstitial
lung disease is seen bilaterally.  There is improved aeration in
the right upper lobe with persistent airspace disease in the left
upper lobe. The cardiopericardial silhouette is enlarged. Bones are
diffusely demineralized.
IMPRESSION: Right chest tube removal without evidence for right pneumothorax.

Slight interval improvement in patchy bilateral airspace disease.

Underlying chronic interstitial change.

## 2010-12-25 IMAGING — CR DG CHEST 1V PORT
1 series · 1 of 1 positions shown · non-contrast
Comparison: Chest 05/14/2010.

CLINICAL DATA: Pneumothorax.  Chest tube.

PORTABLE CHEST - 1 VIEW

[AP]
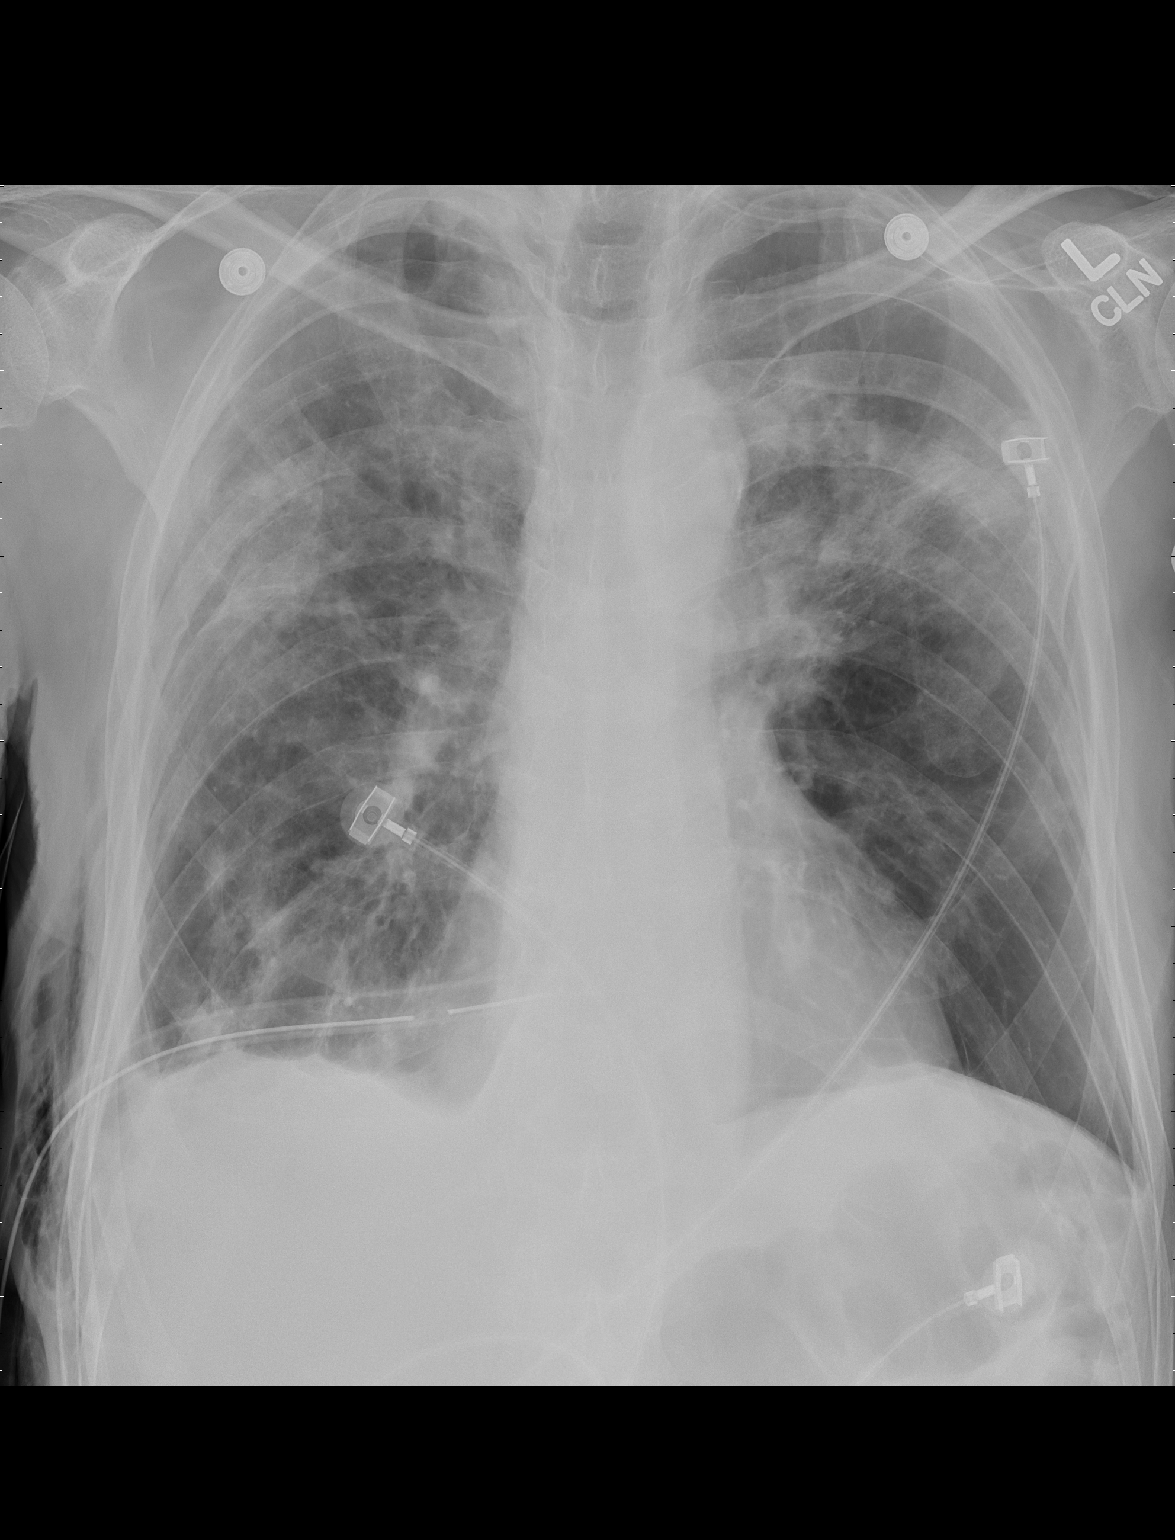

[1 of 1 positions shown; findings below may reference images not displayed]

FINDINGS: Right chest tube remains in place.  No pneumothorax is
identified.  Right worse than left airspace disease persists
without change.  Heart size normal.
IMPRESSION: No interval change.

## 2010-12-27 IMAGING — CR DG CHEST 2V
2 series · 2 of 2 positions shown · non-contrast
Comparison: 05/15/2010.

CLINICAL DATA: Pneumothorax.  Right-sided chest pain.

CHEST - 2 VIEW

[w chest pa]
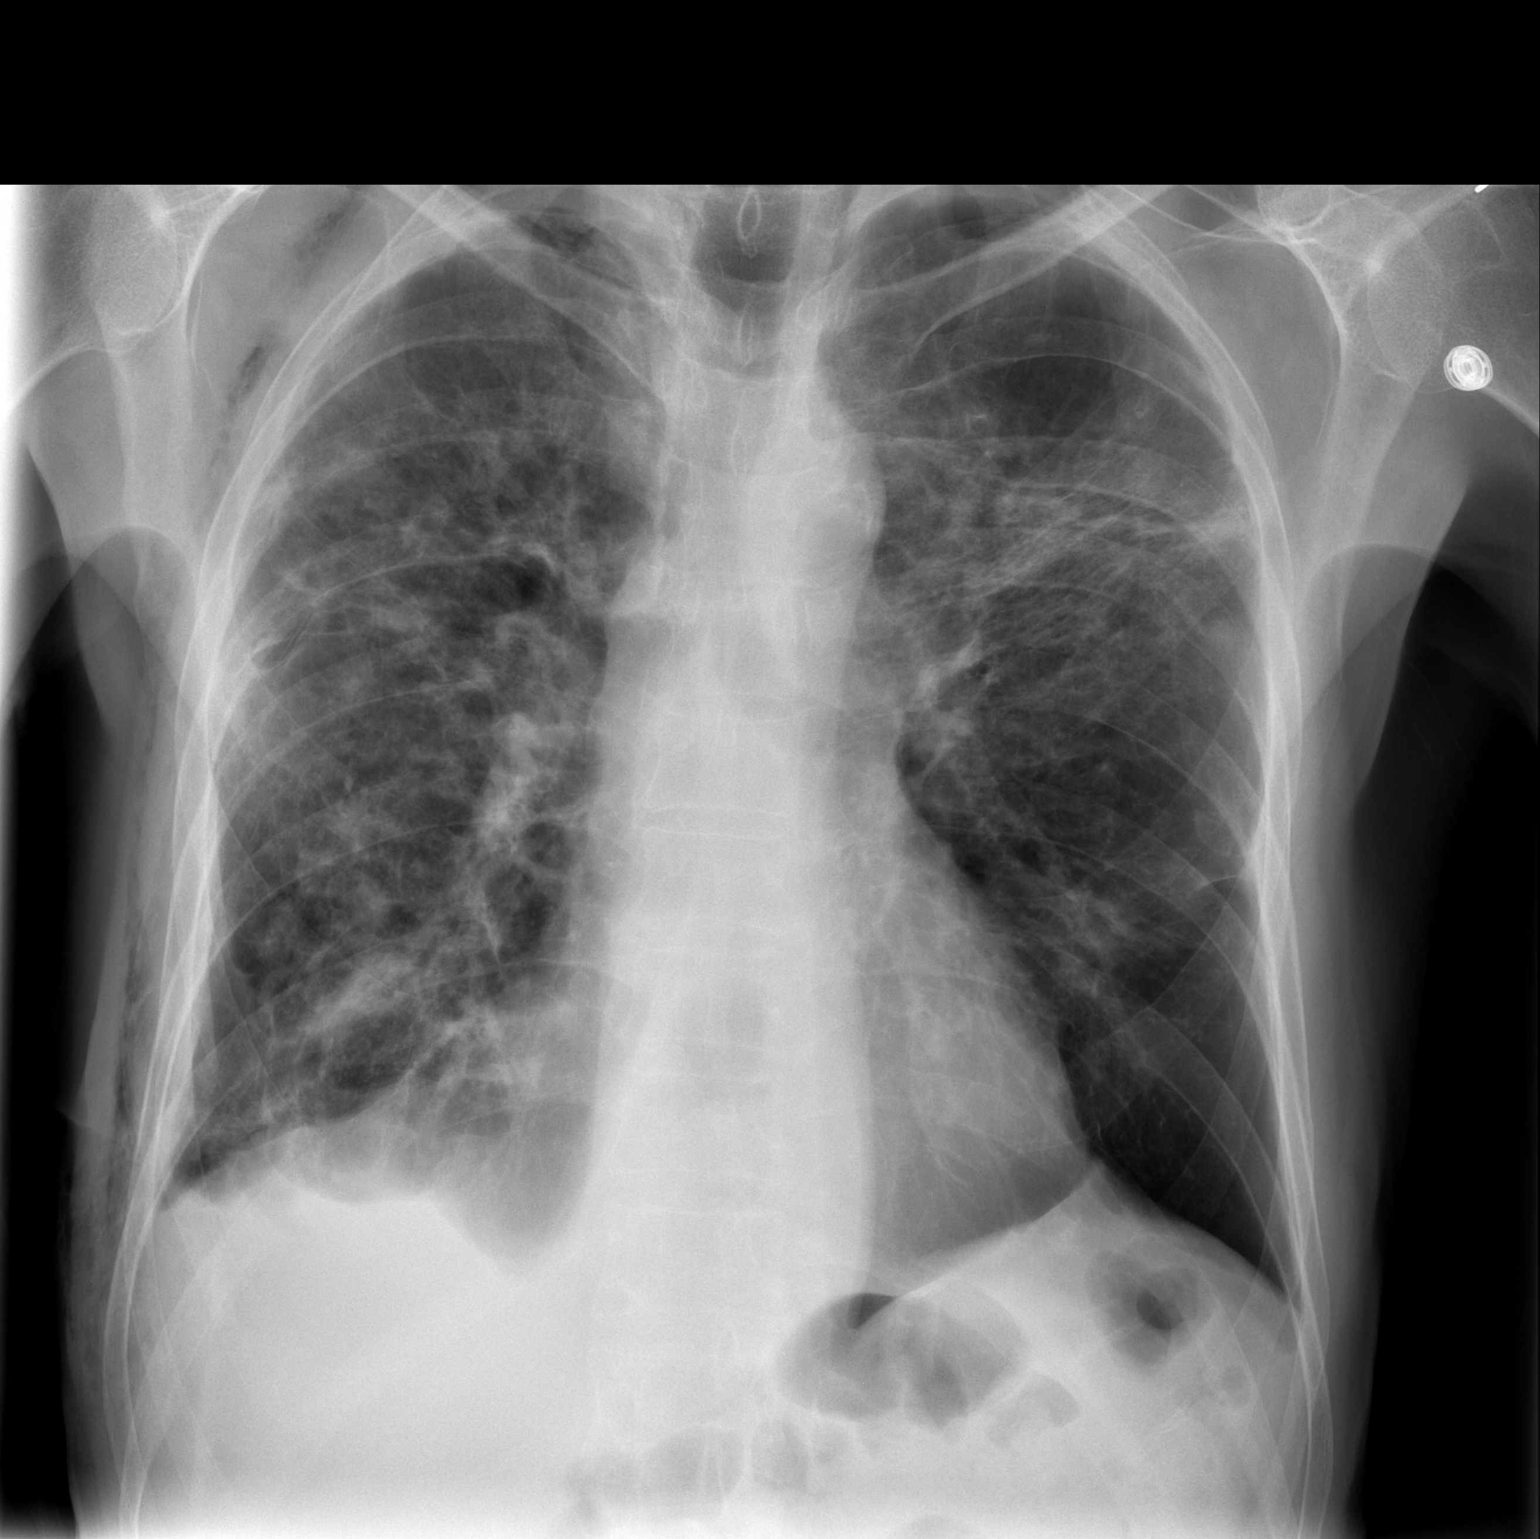

[w chest lat]
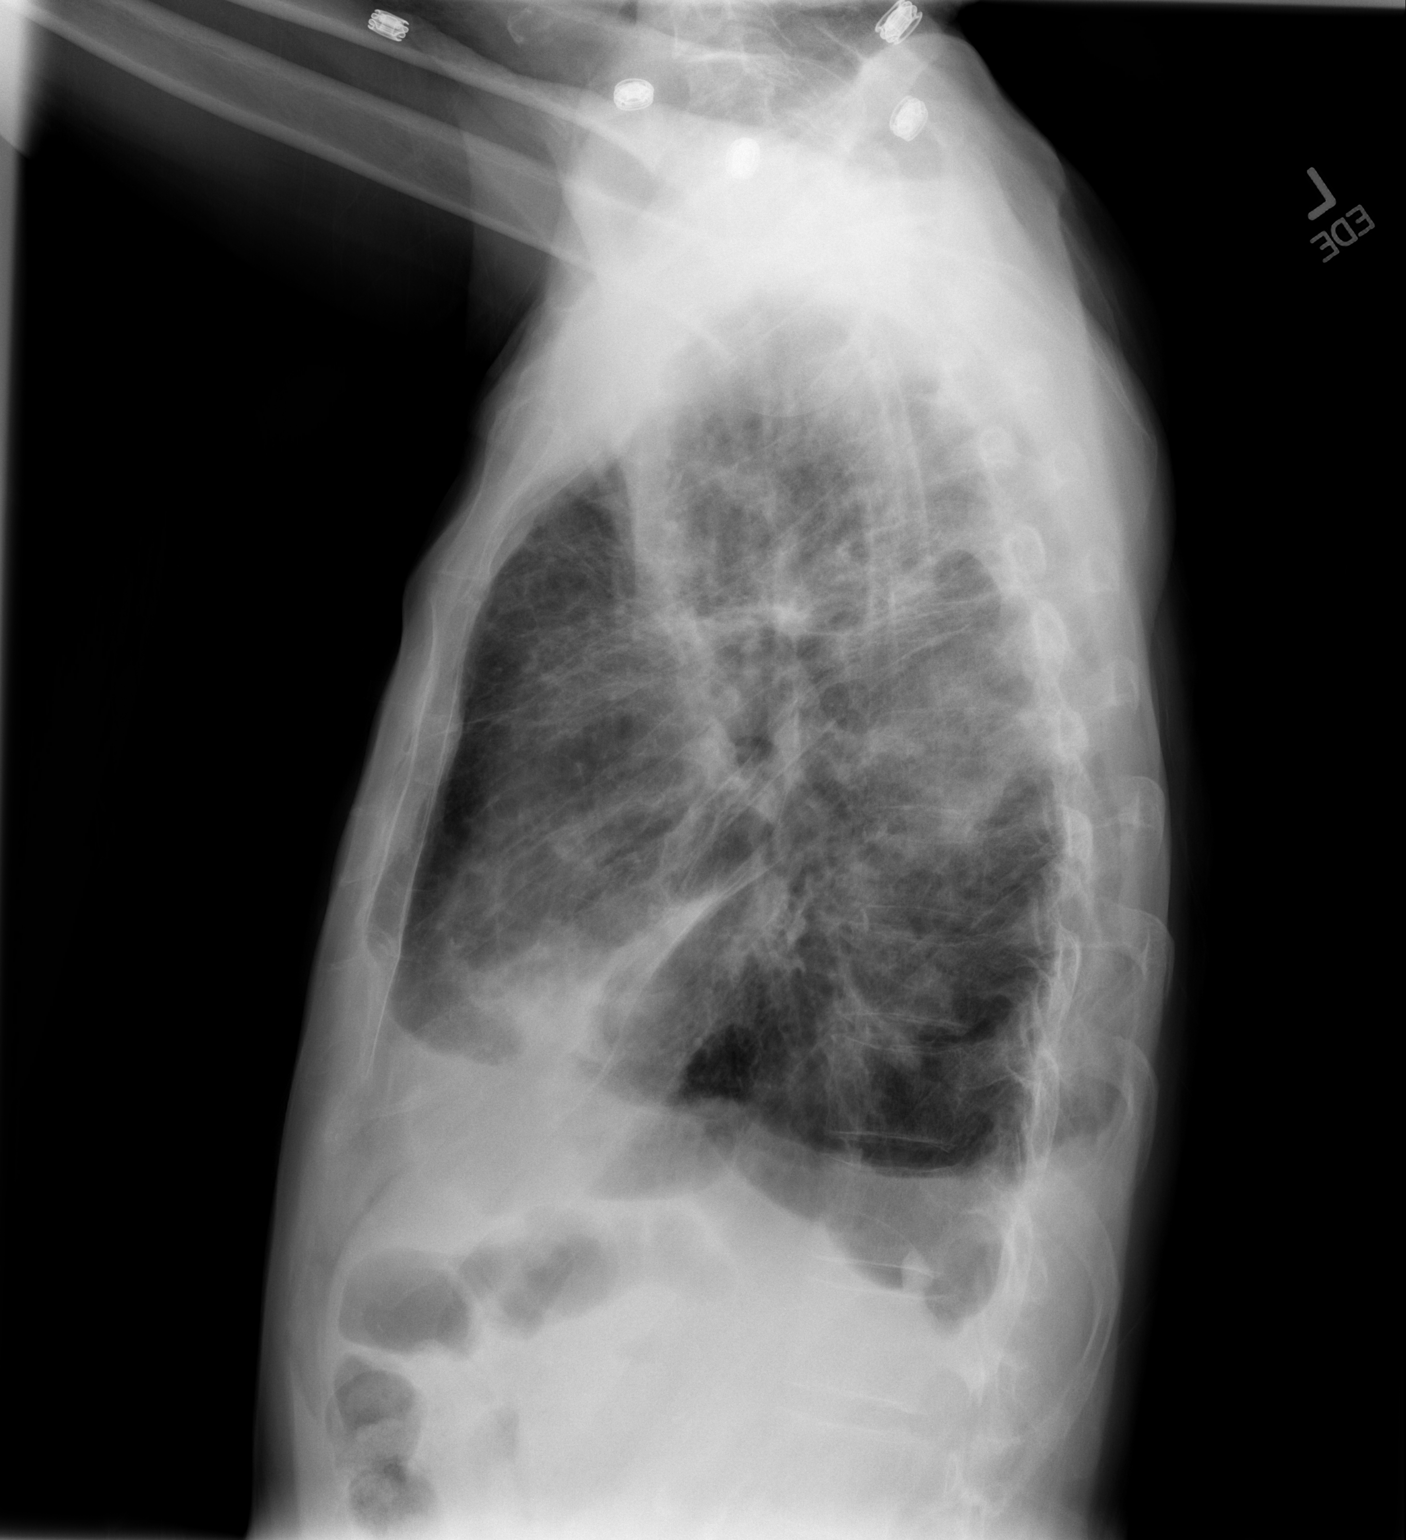

[2 of 2 positions shown; findings below may reference images not displayed]

FINDINGS: Heart size is normal.  Chronic scarring is again seen
throughout both lungs.  There is no definite residual pneumothorax.
The subcutaneous gas remains, slightly more prominent than on the
prior studies.  Question basilar air leak.  Bilateral pleural
effusions remain, worse on the right.
IMPRESSION: 1.  Slight increase and subcutaneous emphysema.  Question basilar
air leak at the site of the previous chest tube.
2.  No pneumothorax.
3.  Stable opacities, in large part representing chronic scarring.

## 2010-12-30 ENCOUNTER — Telehealth: Payer: Self-pay | Admitting: Internal Medicine

## 2010-12-30 NOTE — Telephone Encounter (Signed)
Spoke with pt and he is c/o cough worse x 2 days- prod with dark colored sputum. OV with KC at 1:30 pm tommorrow

## 2010-12-31 ENCOUNTER — Encounter: Payer: Self-pay | Admitting: Pulmonary Disease

## 2010-12-31 ENCOUNTER — Ambulatory Visit (INDEPENDENT_AMBULATORY_CARE_PROVIDER_SITE_OTHER): Payer: Medicare Other | Admitting: Pulmonary Disease

## 2010-12-31 VITALS — BP 100/58 | HR 77 | Temp 98.6°F | Ht 67.0 in | Wt 119.6 lb

## 2010-12-31 DIAGNOSIS — J479 Bronchiectasis, uncomplicated: Secondary | ICD-10-CM

## 2010-12-31 MED ORDER — LEVOFLOXACIN 750 MG PO TABS: 750.0000 mg | ORAL_TABLET | Freq: Every day | ORAL | Status: AC

## 2010-12-31 NOTE — Assessment & Plan Note (Signed)
The pt is having a flare of his bronchiectasis, with worsening chest congestion and purulent mucus.  He will need a course of abx, and will also bump his prednisone given his wheezing on exam today.  He is to let us know if not improving, and already has an upcoming apptm with Dr. [name].

## 2010-12-31 NOTE — Progress Notes (Signed)
  Subjective:    Patient ID: Richard [name], male    DOB: 11-17-1935, 75 y.o.   MRN: [id number]  HPI The pt comes in today for an acute sick visit.  He is usually followed by Dr. [name] for bronchiectasis and Wegener's.  He is on chronic prednisone and cytoxan with pcp prophylaxis with Bactrim. He gives a one week h/o worsening chest congestion, cough with purulent mucus, and worsening sob.  Denies f/c/s.  No sinus congestion or purulence from nares.    Review of Systems  Constitutional: Negative for fever and unexpected weight change.  HENT: Positive for sore throat, rhinorrhea and trouble swallowing. Negative for ear pain, nosebleeds, congestion, sneezing, dental problem, postnasal drip and sinus pressure.   Eyes: Negative for redness and itching.  Respiratory: Positive for cough, chest tightness, shortness of breath and wheezing.   Cardiovascular: Negative for palpitations and leg swelling.  Gastrointestinal: Negative for nausea and vomiting.  Genitourinary: Negative for dysuria.  Musculoskeletal: Negative for joint swelling.  Skin: Negative for rash.  Neurological: Positive for headaches.  Hematological: Bruises/bleeds easily.  Psychiatric/Behavioral: Negative for dysphoric mood. The patient is not nervous/anxious.        Objective:   Physical Exam Thin male in nad Nares patent without discharge or crusting Op clear, no exudates or lesions seen Chest with scattered rhonchi, wheezing, and crackles.  Good airflow Cor with rrr LE without edema, no cyanosis  Alert and oriented, moves all 4        Assessment & Plan:

## 2010-12-31 NOTE — Patient Instructions (Signed)
Will treat your infection with levaquin 750mg  one each day for 5 days Increase your prednisone to 40mg  (8 tabs of 5mg ) each day for 2 days, then 30mg  each day for 2 days, then 20 mg each day for 2 days, then decrease to your usual 5mg  a day.   Keep followup apptm with Dr. [name], but call us if not improving.

## 2011-01-11 ENCOUNTER — Encounter: Payer: Self-pay | Admitting: Internal Medicine

## 2011-01-11 IMAGING — CR DG CHEST 2V
2 series · 2 of 2 positions shown · non-contrast
Comparison: 05/17/2010

CLINICAL DATA: Recent pneumothorax after biopsy.  Wegener's
granulomatosis.

CHEST - 2 VIEW

[view not recorded (1 of 2)]
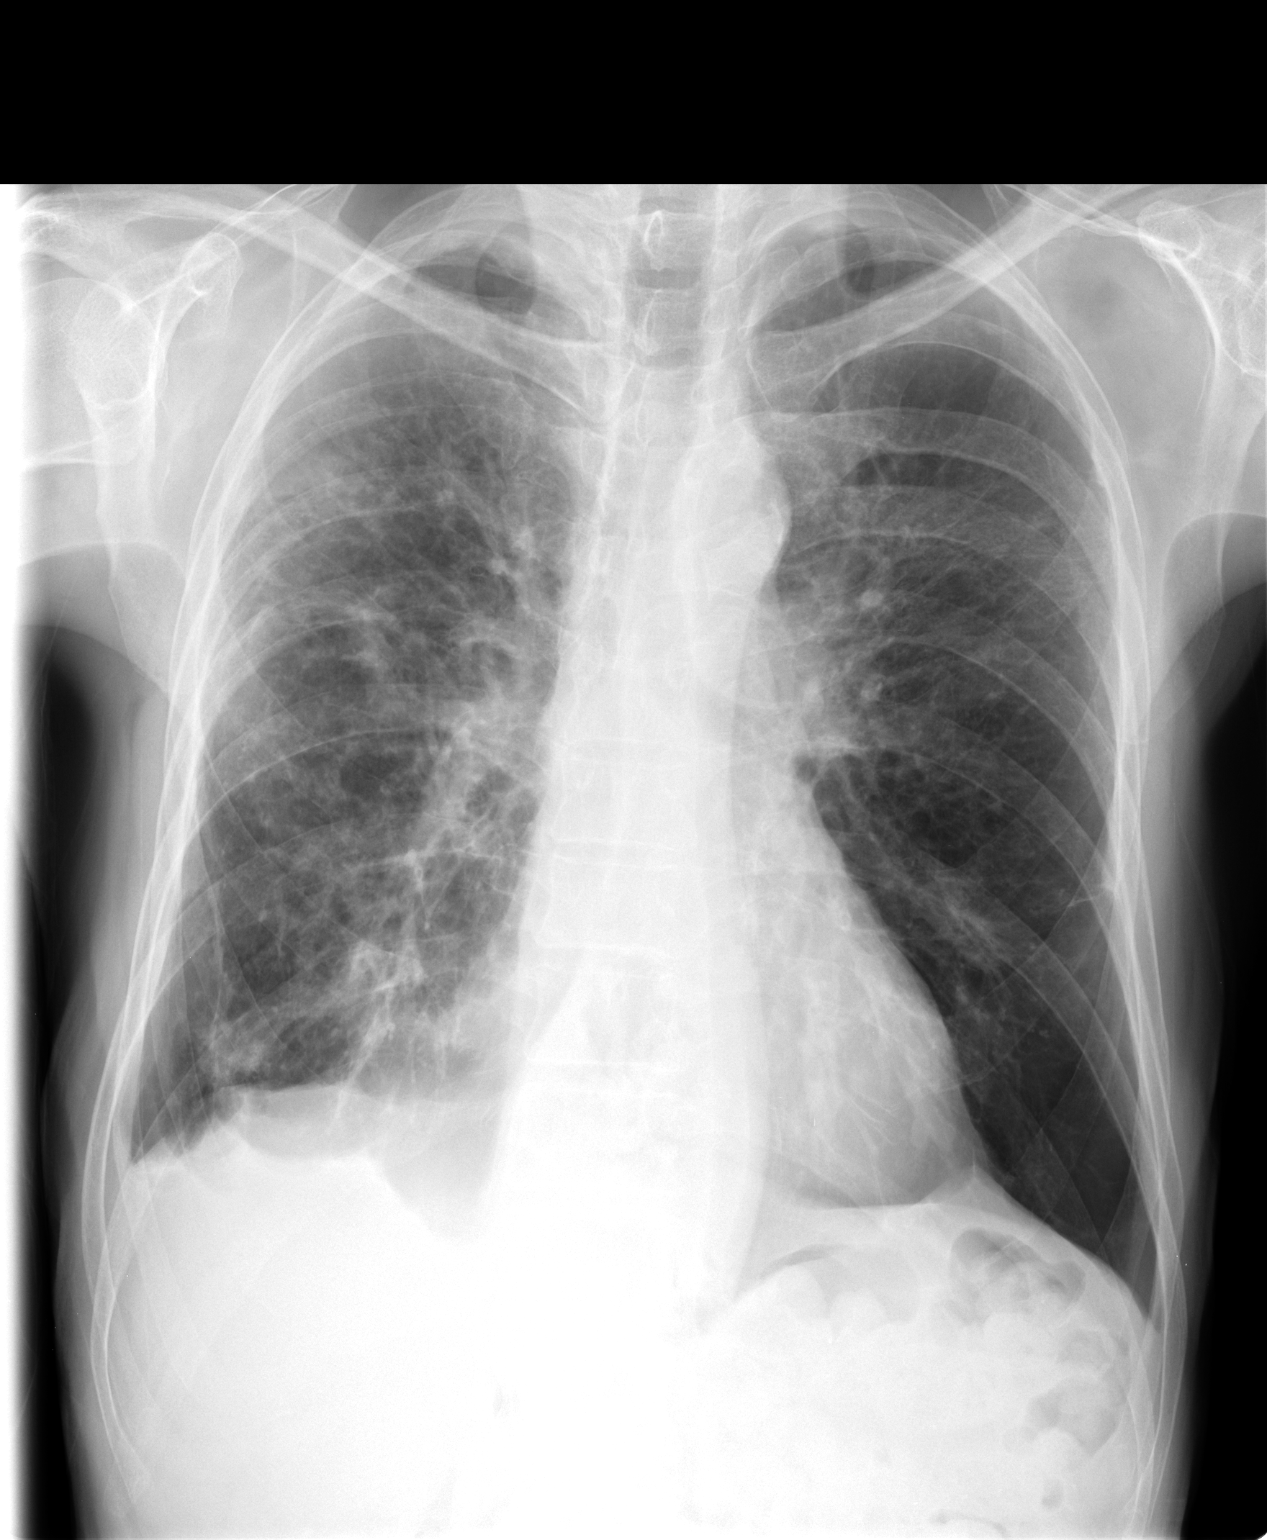

[view not recorded (2 of 2)]
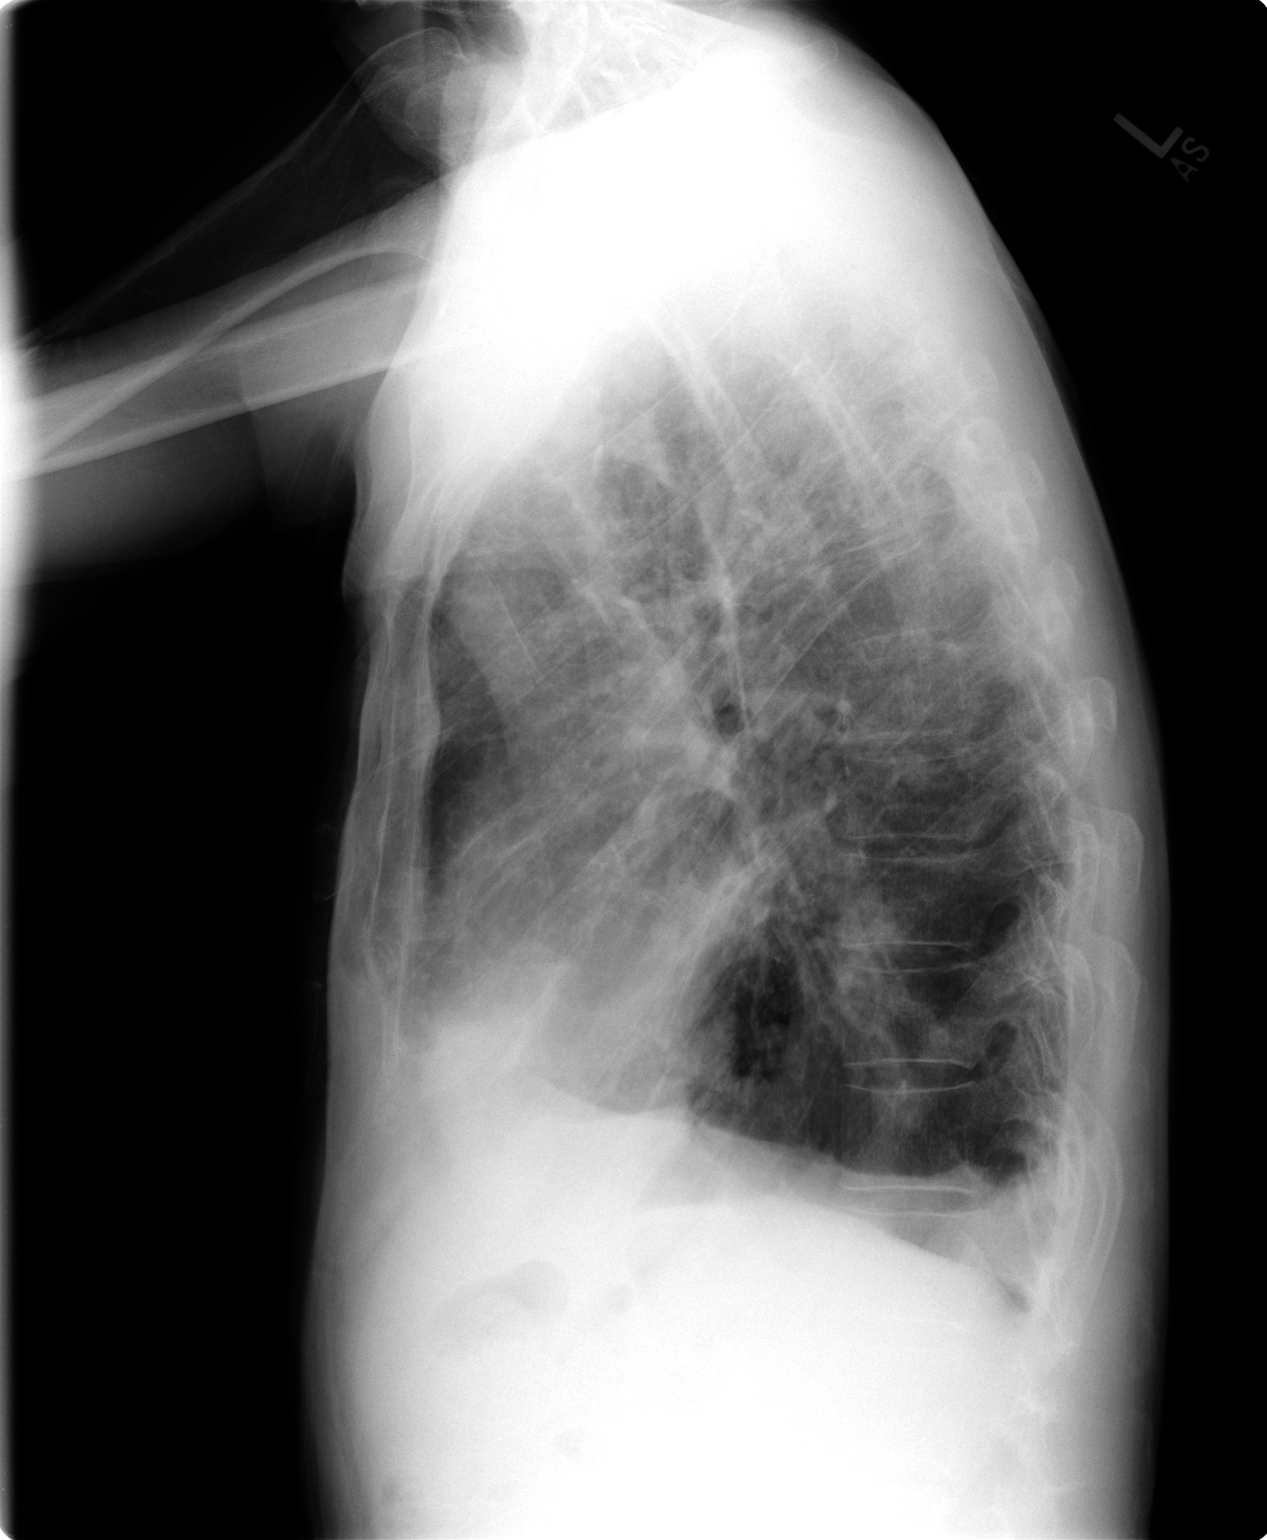

[2 of 2 positions shown; findings below may reference images not displayed]

FINDINGS: Trachea is midline.  Heart size normal.  There has been
some interval improvement in nodular air space disease bilaterally.
Small right pleural effusion.  Hiatal hernia.
IMPRESSION: 1.  Improving bilateral air space disease, without complete
resolution.
2.  Small right pleural effusion.
3.  No definite pneumothorax.

## 2011-01-13 ENCOUNTER — Ambulatory Visit (INDEPENDENT_AMBULATORY_CARE_PROVIDER_SITE_OTHER): Payer: Medicare Other | Admitting: Internal Medicine

## 2011-01-13 ENCOUNTER — Other Ambulatory Visit (INDEPENDENT_AMBULATORY_CARE_PROVIDER_SITE_OTHER): Payer: Medicare Other

## 2011-01-13 ENCOUNTER — Encounter: Payer: Self-pay | Admitting: Internal Medicine

## 2011-01-13 VITALS — BP 100/62 | HR 60 | Temp 98.0°F | Ht 67.0 in | Wt 118.4 lb

## 2011-01-13 DIAGNOSIS — D649 Anemia, unspecified: Secondary | ICD-10-CM

## 2011-01-13 DIAGNOSIS — J479 Bronchiectasis, uncomplicated: Secondary | ICD-10-CM

## 2011-01-13 DIAGNOSIS — M3130 Wegener's granulomatosis without renal involvement: Secondary | ICD-10-CM

## 2011-01-13 LAB — CBC WITH DIFFERENTIAL/PLATELET
Basophils Absolute: 0 10*3/uL (ref 0.0–0.1)
Eosinophils Absolute: 0.1 10*3/uL (ref 0.0–0.7)
MCHC: 34.6 g/dL (ref 30.0–36.0)
MCV: 112.4 fl — ABNORMAL HIGH (ref 78.0–100.0)
Monocytes Absolute: 0.5 10*3/uL (ref 0.1–1.0)
Neutrophils Relative %: 87.9 % — ABNORMAL HIGH (ref 43.0–77.0)
Platelets: 216 10*3/uL (ref 150.0–400.0)
RDW: 17.1 % — ABNORMAL HIGH (ref 11.5–14.6)

## 2011-01-13 LAB — BASIC METABOLIC PANEL
BUN: 20 mg/dL (ref 6–23)
GFR: 65.68 mL/min (ref >60.00)
Potassium: 5.1 mEq/L (ref 3.5–5.1)
Sodium: 141 mEq/L (ref 135–145)

## 2011-01-13 MED ORDER — PREDNISONE 5 MG PO TABS: 5.0000 mg | ORAL_TABLET | Freq: Every day | ORAL | Status: DC

## 2011-01-13 NOTE — Assessment & Plan Note (Signed)
Adequate control on present rx, reviewed     Each maintenance medication was reviewed in detail including most importantly the difference between maintenance and as needed and under what circumstances the prns are to be used. This was done in the context of a medication calendar review which provided the patient with a user-friendly unambiguous mechanism for medication administration and reconciliation and provides an action plan for all active problems. It is critical that this be shown to every doctor  for modification during the office visit if necessary so the patient can use it as a working document.     

## 2011-01-13 NOTE — Assessment & Plan Note (Signed)
Discussed in detail all the  indications, usual  risks and alternatives  relative to the benefits with patient who agrees to proceed with continue ctx until August then try off and see if flares (complete one year of therapy planned)

## 2011-01-13 NOTE — Patient Instructions (Signed)
Please schedule a follow up office visit in 6 weeks, call sooner if needed

## 2011-01-13 NOTE — Assessment & Plan Note (Signed)
B12 ok,  Folate sent, MCV trending up Will discuss options with hematology

## 2011-01-13 NOTE — Progress Notes (Signed)
Subjective:    Patient ID: Richard [name], male    DOB: Dec 11, 1935, 75 y.o.   MRN: [id number]  HPI 75 yowm never smoker with dx of Longstanding bronchiectasis then dx with WG 03/2010 > requested establish with Lelania Bia.   July 08, 2009- Bronchiectasis, chronic bronchits, allergic rhinitis  Had flu vax and has had at least 2 pneumovax.   Admit [facility] dx WG by pos c anca 03/2010, supportive tbbx but not vats   Admit [name] dx spont L Ptx 9/08/15/09 requiring Chest tube but no surgery   05/24/10--NP ov/ med reivew. Since last visit. has been hospitalized 05/12/2010-- 10/10/2011for Right pneumothorax., with underlying Wegener granulomatosis. and . Bronchiectasis. Pt was found to have Right Pneumo. Chest tube was placed. with improvement and removed 10/7. Xray on 10/10 w/ resolved pnuemo. He was continued on cytoxan and bactrim for PCP prophylaxis. rec. Prednisone 20 mg reduce to one half daily  Aspirin 81 mg one daily with breakfast but stop for any bleeding  See Patient Care Coordinator before leaving for cardiology appt > saw Dr [name], ? LHC needed   July 21, 2010 ov sob better no longer needing any 02 at all during day, no hemoptysis , producing occ yellow mucus - reports intermittent noct bilateral shoulder pain better with streching, moving, massage. no assoc nausea or sweats. recd try pred 5 mg daily   August 24, 2010 ov cough w/ yellow phlem, breathing is better, using flutter and med calendar consistently. R chest pain with deep breathing no change since R chest tube removed in 05/2010.   October 13, 2010 --Presents for an acute office visit. Complains of productive cough with yellow mucus, sore throat, rattling in chest, low grade temp x3days. Currently on pred 1/2 every other day . OTC not helping. rec continue qod pred, rx with augmentin x 10 days   11/16/2010 ov all smiles, no change on days of prednisone, minimal discolored sputum.   Try off prednisone completely.  If you feel a lot worse  over the next  week(worse breathing/ coughing/ nausea or loss of appetite) resume prednisone 5 mg one half daily until better then return back one half every other day  Late add:  Needs B12 and RBC Folate next ov  12/16/2010 ov/Ranelle Auker cc no better on 5mg  dosed one half every other day (not the dose rec), still nauseated, still discolored p doxy complete May 7, sob in shower but not as much walking and no longer using 02, struggling with contingency plans. rec Work on inhaler technique:  If short of breath go ahead and use the xopenex hfa 2 puffs every 4 hours  Prednisone 5 mg 4 daily until better, then 2 daily x 5 days, then 1 daily thereafter   12/31/10 ov/[name], exac of bronchiectasis/wheeze no hemoptysis rx with pred burst and levaquin 750 x 5d  01/13/2011 ov/Marshon Bangs  Cc sob and cough better now tapered prednisone to 5 mg each am using xop once daily around noon no problem with sleep or am exac of cough.     Pt denies any significant sore throat, dysphagia, itching, sneezing,  nasal congestion or excess/ purulent secretions,  fever, chills, sweats, unintended wt loss, pleuritic or exertional cp, hempoptysis, orthopnea pnd or leg swelling.    Also denies any obvious fluctuation of symptoms with weather or environmental changes or other aggravating or alleviating factors.        PMHx:  Allergic rhinitis  GERD- hiatal hernia with reflux  - Repeat Barium Swallow  c/w stricture and reflux 03/29/10  Bronchiectasis  - See CT chest 12/14/2009 > progressive bilateral R> L cavities 03/26/10 > clinical dx Wegener's 04/07/10  - PFTs 02/17/10 FEV1 1.52 (58%) ratio 42 and 15% better after B2, DLC0 118%  WEGENER'S GRANULOMATOSIS  - dX BY pos C anca/ supportive TBBX 04/10/10 > CYTOXAN initiated  - Prednisone tapered off March 2012 > resumed 12/16/2010 daily due to nausea and worse cough PAF/ abn echo during [facility] admit 03/30/10  - ECH0 Aug 23 2011LVH with low nl ef, mild mr, ok R function, peak trop .59  - Not  anticoagulated due to WG with Hemoptysis  L PTX 04/25/10  R PTX 05/12/10 > final f/u by [name] 06/01/10  benign hypertrophy of the prostate  Complex med regimen--Meds reviewed with pt education and computerized med calendar May 25, 2010   Family History:  Mother- deceased age 55;car wreck  Father- deceased age 77; breathing problems;asthma  Brother- living age 106; heart CAD (s/p CABG)  Sibling- deceased age 61; heart (s/p CABG)   Social History:  Retired  Non smoker  Married with 3 children  Risk Factors:  Smoking Status: never (05/27/2010)     Review of Systems     Objective:   Physical Exam   amb thin pleasant wm nad  Baseline wt =130  Wt 110 May 10, 2010 > 120 August 24, 2010 >  > 122 11/16/2010 >  115 12/16/2010  > 01/13/2011  118 chronically amb, up on table without assistance.  SKIN: no rash, lesions  NODES: no lymphadenopathy  HEENT: /AT, EOM- WNL, Conjuctivae- clear, PERRLA, TM-WNL, Nose- clear, Throat- clear and wnl, Mallampati III  NECK: Supple w/ fair ROM, JVD- none, normal carotid impulses w/o bruits Thyroid-  CHEST: Mininimal exp rhonchi bilaterally  HEART: RRR, no m/g/r heard  ABDOMEN: Soft and nl;   Walked 3 laps @ 185 ft each stopped due to  End of test s difficulty, sats fine RA       Assessment & Plan:

## 2011-01-14 NOTE — Progress Notes (Signed)
Quick Note:  Spoke with pt and notified of results per Dr. [name]. Pt verbalized understanding and denied any questions.  ______ 

## 2011-01-27 ENCOUNTER — Other Ambulatory Visit: Payer: Self-pay | Admitting: Family Medicine

## 2011-01-31 ENCOUNTER — Other Ambulatory Visit: Payer: Self-pay | Admitting: Family Medicine

## 2011-02-03 ENCOUNTER — Other Ambulatory Visit: Payer: Self-pay

## 2011-02-03 MED ORDER — DUTASTERIDE 0.5 MG PO CAPS: 0.5000 mg | ORAL_CAPSULE | Freq: Every day | ORAL | Status: DC

## 2011-02-03 NOTE — Telephone Encounter (Signed)
Refill Avodart

## 2011-02-10 ENCOUNTER — Telehealth: Payer: Self-pay | Admitting: Internal Medicine

## 2011-02-10 NOTE — Telephone Encounter (Signed)
Pt aware to stop ASA. He will go downstairs first for CXR before seeing MW on Fri., 7/6. Order put in for CXR.

## 2011-02-10 NOTE — Telephone Encounter (Signed)
Stop aspirin  No antibiotics for now, regroup tomorrow with cxr on arrival

## 2011-02-10 NOTE — Telephone Encounter (Signed)
Called and spoke with pt.  Pt c/o coughing up blood, about a quarter-sized amount each time he coughs.  This started yesterday. Pt denies increased sob, wheezing or tightness in chest.  Denies f/c/s. Pt is scheduled to see MW tomorrow at 2pm  but wanted to know if MW had any further recs before appt tomorrow.  Please advise.  Thanks.

## 2011-02-11 ENCOUNTER — Ambulatory Visit (INDEPENDENT_AMBULATORY_CARE_PROVIDER_SITE_OTHER)
Admission: RE | Admit: 2011-02-11 | Discharge: 2011-02-11 | Disposition: A | Discharge status: Home / Self Care | Payer: Medicare Other | Source: Ambulatory Visit | Attending: Internal Medicine | Admitting: Internal Medicine

## 2011-02-11 ENCOUNTER — Ambulatory Visit (INDEPENDENT_AMBULATORY_CARE_PROVIDER_SITE_OTHER): Payer: Medicare Other | Admitting: Internal Medicine

## 2011-02-11 ENCOUNTER — Encounter: Payer: Self-pay | Admitting: Internal Medicine

## 2011-02-11 DIAGNOSIS — J479 Bronchiectasis, uncomplicated: Secondary | ICD-10-CM

## 2011-02-11 DIAGNOSIS — M3130 Wegener's granulomatosis without renal involvement: Secondary | ICD-10-CM

## 2011-02-11 DIAGNOSIS — R042 Hemoptysis: Secondary | ICD-10-CM

## 2011-02-11 MED ORDER — LEVOFLOXACIN 750 MG PO TABS: 750.0000 mg | ORAL_TABLET | Freq: Every day | ORAL | Status: DC

## 2011-02-11 NOTE — Patient Instructions (Addendum)
Levaquin 750 mg x 5 days courses whenever mucus turns bloody, more dark or with high fever.  Stop cytoxan and sulfasoxazole   Don't take aspirin if any active bleeding   Please schedule a follow up office visit in 4 weeks, sooner if needed with cxr on return

## 2011-02-11 NOTE — Progress Notes (Signed)
Subjective:    Patient ID: Richard [name], male    DOB: 1935/10/12, 75 y.o.   MRN: [id number]  HPI 53 yowm never smoker with dx of Longstanding bronchiectasis then dx with WG 03/2010 > requested establish with [facility].   July 08, 2009- Bronchiectasis, chronic bronchits, allergic rhinitis  Had flu vax and has had at least 2 pneumovax.   Admit [facility] dx WG by pos c anca 03/2010, supportive tbbx but not vats   Admit [name] dx spont L Ptx 9/08/15/09 requiring Chest tube but no surgery   05/24/10--NP ov/ med reivew. Since last visit. has been hospitalized 05/12/2010-- 10/10/2011for Right pneumothorax., with underlying Wegener granulomatosis. and . Bronchiectasis. Pt was found to have Right Pneumo. Chest tube was placed. with improvement and removed 10/7. Xray on 10/10 w/ resolved pnuemo. He was continued on cytoxan and bactrim for PCP prophylaxis. rec. Prednisone 20 mg reduce to one half daily  Aspirin 81 mg one daily with breakfast but stop for any bleeding  See Patient Care Coordinator before leaving for cardiology appt > saw Dr [name], ? LHC needed   July 21, 2010 ov sob better no longer needing any 02 at all during day, no hemoptysis , producing occ yellow mucus - reports intermittent noct bilateral shoulder pain better with streching, moving, massage. no assoc nausea or sweats. recd try pred 5 mg daily   August 24, 2010 ov cough w/ yellow phlem, breathing is better, using flutter and med calendar consistently. R chest pain with deep breathing no change since R chest tube removed in 05/2010.   October 13, 2010 --Presents for an acute office visit. Complains of productive cough with yellow mucus, sore throat, rattling in chest, low grade temp x3days. Currently on pred 1/2 every other day . OTC not helping. rec continue qod pred, rx with augmentin x 10 days   11/16/2010 ov all smiles, no change on days of prednisone, minimal discolored sputum.   Try off prednisone completely.  If you feel a lot worse  over the next  week(worse breathing/ coughing/ nausea or loss of appetite) resume prednisone 5 mg one half daily until better then return back one half every other day  Late add:  Needs B12 and RBC Folate next ov  12/16/2010 ov/[name] cc no better on 5mg  dosed one half every other day (not the dose rec), still nauseated, still discolored p doxy complete May 7, sob in shower but not as much walking and no longer using 02, struggling with contingency plans. rec Work on inhaler technique:  If short of breath go ahead and use the xopenex hfa 2 puffs every 4 hours  Prednisone 5 mg 4 daily until better, then 2 daily x 5 days, then 1 daily thereafter   12/31/10 ov/[name], exac of bronchiectasis/wheeze no hemoptysis rx with pred burst and levaquin 750 x 5d  01/13/2011 ov/[name]  Cc sob and cough better now tapered prednisone to 5 mg each am using xop once daily around noon no problem with sleep or am exac of cough.   rec  02/11/2011 [name] cc recurrent hemoptysis on top of yellow that usually comes up esp in am x one year but no epistaxis, no cp or arthralgias or increased doe.  Pt denies any significant sore throat, dysphagia, itching, sneezing,  nasal congestion or excess/ purulent secretions,  fever, chills, sweats, unintended wt loss, pleuritic or exertional cp, hempoptysis, orthopnea pnd or leg swelling.    Also denies any obvious fluctuation of symptoms with weather  or environmental changes or other aggravating or alleviating factors.  Sleeping ok without nocturnal  or early am exac of resp c/o's        PMHx:  Allergic rhinitis  GERD- hiatal hernia with reflux  - Repeat Barium Swallow c/w stricture and reflux 03/29/10  Bronchiectasis  - See CT chest 12/14/2009 > progressive bilateral R> L cavities 03/26/10 > clinical dx Wegener's 04/07/10  - PFTs 02/17/10 FEV1 1.52 (58%) ratio 42 and 15% better after B2, DLC0 118%  WEGENER'S GRANULOMATOSIS  - dX BY pos C anca/ supportive TBBX 04/10/10 > CYTOXAN  initiated  > trial off 02/12/2011  - Prednisone tapered off March 2012 > resumed 12/16/2010 daily due to nausea and worse cough PAF/ abn echo during [facility] admit 03/30/10  - ECH0 Aug 23 2011LVH with low nl ef, mild mr, ok R function, peak trop .59  - Not anticoagulated due to WG with Hemoptysis  L PTX 04/25/10  R PTX 05/12/10 > final f/u by [name] 06/01/10  benign hypertrophy of the prostate  Complex med regimen--Meds reviewed with pt education and computerized med calendar May 25, 2010   Family History:  Mother- deceased age 50;car wreck  Father- deceased age 32; breathing problems;asthma  Brother- living age 11; heart CAD (s/p CABG)  Sibling- deceased age 48; heart (s/p CABG)   Social History:  Retired  Non smoker  Married with 3 children  Risk Factors:  Smoking Status: never (05/27/2010)     Review of Systems     Objective:   Physical Exam   amb thin pleasant wm nad  Baseline wt =130  Wt 110 May 10, 2010 > 120 August 24, 2010 >  > 122 11/16/2010 >  115 12/16/2010  > 01/13/2011  118 > 120 02/11/2011  chronically amb, up on table without assistance.  SKIN: no rash, lesions  NODES: no lymphadenopathy  HEENT: [place]/AT, EOM- WNL, Conjuctivae- clear, PERRLA, TM-WNL, Nose- clear, Throat- clear and wnl, Mallampati III  NECK: Supple w/ fair ROM, JVD- none, normal carotid impulses w/o bruits Thyroid-  CHEST: Mininimal exp rhonchi bilaterally  HEART: RRR, no m/g/r heard  ABDOMEN: Soft and nl;       02/11/11 cxr No acute finding. Stable compared prior exam.     Assessment & Plan:

## 2011-02-12 ENCOUNTER — Encounter: Payer: Self-pay | Admitting: Internal Medicine

## 2011-02-12 NOTE — Assessment & Plan Note (Signed)
He has been on ctx x almost a year with worsening macrocytic anemia and neg anca's now so will try off cytoxan and follow monthly  Discussed in detail all the  indications, usual  risks and alternatives  relative to the benefits with patient who agrees to proceed with trial off ctx.

## 2011-02-12 NOTE — Assessment & Plan Note (Signed)
Chronic low grade tracheobronchitis with new hemoptysis and recent anca's negative so this is likely just a complication of bronchiectasis.  Try cycles of levaquin x 5 days and off cytoxan for now.    Each maintenance medication was reviewed in detail including most importantly the difference between maintenance and as needed and under what circumstances the prns are to be used.  Please see instructions for details which were reviewed in writing and the patient given a copy.

## 2011-02-14 ENCOUNTER — Ambulatory Visit: Payer: Medicare Other | Admitting: Internal Medicine

## 2011-02-23 ENCOUNTER — Other Ambulatory Visit: Payer: Self-pay | Admitting: Family Medicine

## 2011-02-24 ENCOUNTER — Ambulatory Visit: Payer: Medicare Other | Admitting: Internal Medicine

## 2011-03-02 IMAGING — CR DG CHEST 2V
2 series · 2 of 2 positions shown · non-contrast
Comparison: 06/01/2010 and earlier

CLINICAL DATA: Short of breath/Wegener's granulomatosis

CHEST - 2 VIEW

[view not recorded (1 of 2)]
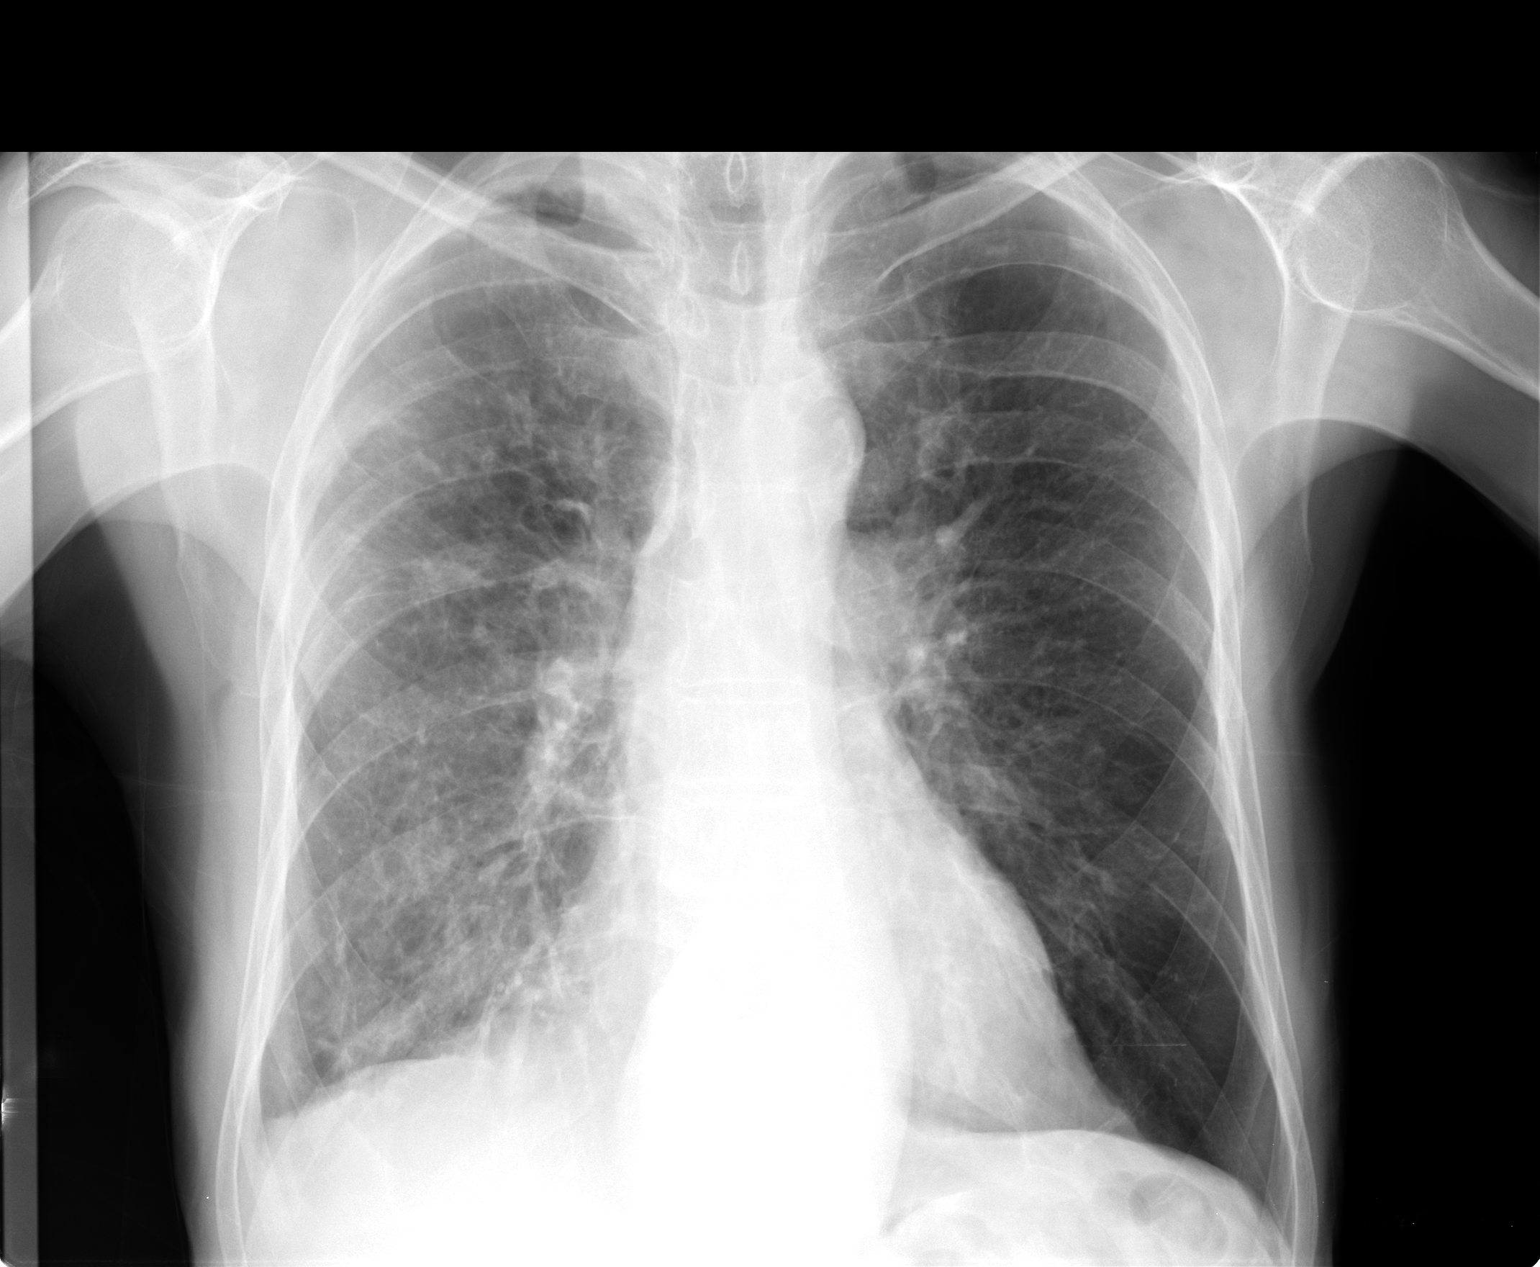

[view not recorded (2 of 2)]
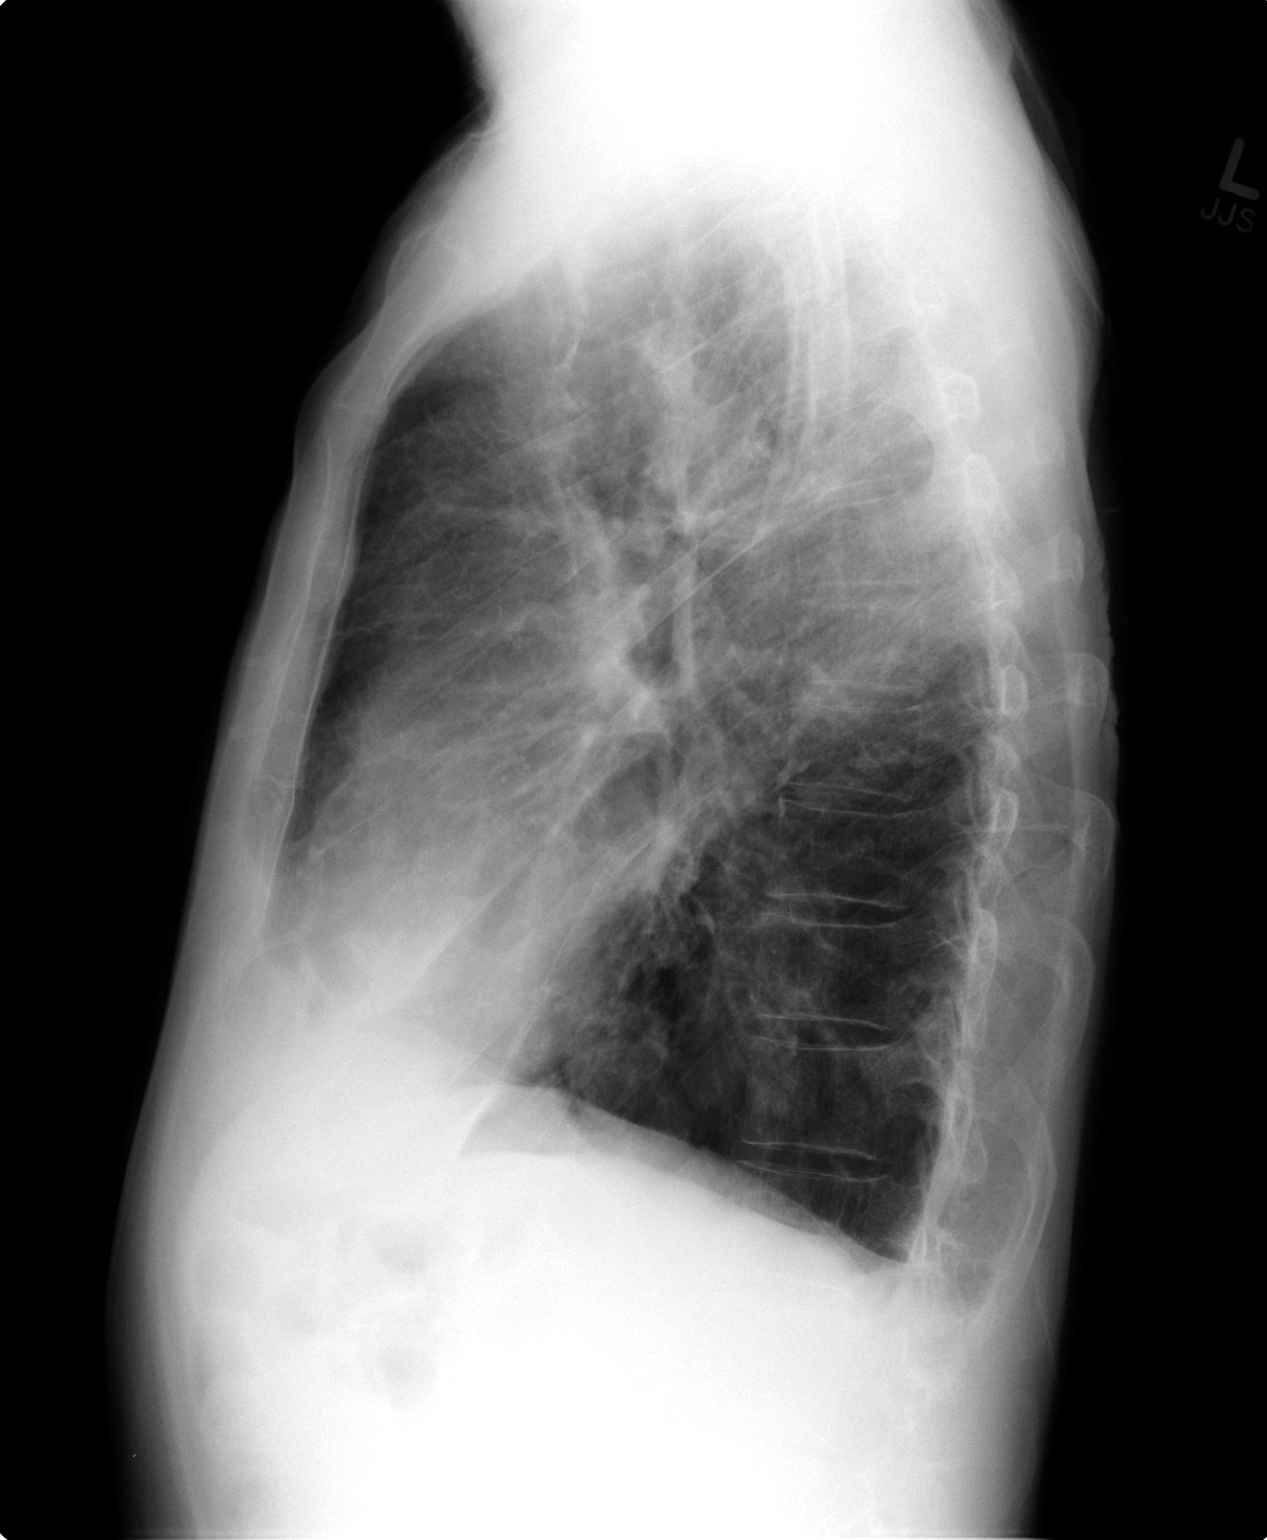

[2 of 2 positions shown; findings below may reference images not displayed]

FINDINGS: Heart size normal.  Bilateral airspace densities about
the same, allowing for some differences in technique.  The right
lung is more involved than the left.  The lungs are hyperaerated.
No new findings are evident.

No pleural fluid.  Osseous structures intact.
IMPRESSION: Chronic changes without definite new findings since 06/01/2010.

## 2011-03-11 ENCOUNTER — Ambulatory Visit: Payer: Medicare Other | Admitting: Internal Medicine

## 2011-03-14 ENCOUNTER — Ambulatory Visit: Payer: Medicare Other | Admitting: Internal Medicine

## 2011-03-16 ENCOUNTER — Ambulatory Visit (INDEPENDENT_AMBULATORY_CARE_PROVIDER_SITE_OTHER): Payer: Medicare Other | Admitting: Internal Medicine

## 2011-03-16 ENCOUNTER — Other Ambulatory Visit (INDEPENDENT_AMBULATORY_CARE_PROVIDER_SITE_OTHER): Payer: Medicare Other

## 2011-03-16 ENCOUNTER — Ambulatory Visit (INDEPENDENT_AMBULATORY_CARE_PROVIDER_SITE_OTHER)
Admission: RE | Admit: 2011-03-16 | Discharge: 2011-03-16 | Disposition: A | Discharge status: Home / Self Care | Payer: Medicare Other | Source: Ambulatory Visit | Attending: Internal Medicine | Admitting: Internal Medicine

## 2011-03-16 ENCOUNTER — Encounter: Payer: Self-pay | Admitting: Internal Medicine

## 2011-03-16 VITALS — BP 104/60 | HR 73 | Temp 98.4°F | Ht 67.0 in | Wt 127.2 lb

## 2011-03-16 DIAGNOSIS — M3130 Wegener's granulomatosis without renal involvement: Secondary | ICD-10-CM

## 2011-03-16 DIAGNOSIS — J479 Bronchiectasis, uncomplicated: Secondary | ICD-10-CM

## 2011-03-16 DIAGNOSIS — D649 Anemia, unspecified: Secondary | ICD-10-CM

## 2011-03-16 LAB — CBC WITH DIFFERENTIAL/PLATELET
Basophils Absolute: 0 10*3/uL (ref 0.0–0.1)
Basophils Relative: 0.1 % (ref 0.0–3.0)
Eosinophils Absolute: 0.1 10*3/uL (ref 0.0–0.7)
Hemoglobin: 11.3 g/dL — ABNORMAL LOW (ref 13.0–17.0)
Lymphocytes Relative: 9 % — ABNORMAL LOW (ref 12.0–46.0)
Lymphs Abs: 0.6 10*3/uL — ABNORMAL LOW (ref 0.7–4.0)
MCHC: 33.1 g/dL (ref 30.0–36.0)
MCV: 110.5 fl — ABNORMAL HIGH (ref 78.0–100.0)
Monocytes Absolute: 0.5 10*3/uL (ref 0.1–1.0)
Neutro Abs: 5.4 10*3/uL (ref 1.4–7.7)
RBC: 3.08 Mil/uL — ABNORMAL LOW (ref 4.22–5.81)
RDW: 12.8 % (ref 11.5–14.6)

## 2011-03-16 LAB — BASIC METABOLIC PANEL
BUN: 25 mg/dL — ABNORMAL HIGH (ref 6–23)
Calcium: 9 mg/dL (ref 8.4–10.5)
Creatinine, Ser: 1.3 mg/dL (ref 0.4–1.5)
GFR: 56.06 mL/min — ABNORMAL LOW (ref >60.00)
Glucose, Bld: 93 mg/dL (ref 70–99)
Potassium: 4.8 mEq/L (ref 3.5–5.1)

## 2011-03-16 NOTE — Progress Notes (Signed)
Quick Note:  Spoke with pt and notified of results per Dr. [name]. Pt verbalized understanding and denied any questions.  ______ 

## 2011-03-16 NOTE — Progress Notes (Signed)
Subjective:    Patient ID: Richard [name], male    DOB: 07-29-1936, 75 y.o.   MRN: [id number]  HPI  5 yowm never smoker with dx of Longstanding bronchiectasis then dx with WG 03/2010 > requested establish with Richard [name].   July 08, 2009- Bronchiectasis, chronic bronchits, allergic rhinitis  Had flu vax and has had at least 2 pneumovax.   Admit [facility] dx WG by pos c anca 03/2010, supportive tbbx but not vats   Admit [name] dx spont L Ptx 9/08/15/09 requiring Chest tube but no surgery   05/24/10--NP ov/ med reivew. Since last visit. has been hospitalized 05/12/2010-- 10/10/2011for Right pneumothorax., with underlying Wegener granulomatosis. and . Bronchiectasis. Pt was found to have Right Pneumo. Chest tube was placed. with improvement and removed 10/7. Xray on 10/10 w/ resolved pnuemo. He was continued on cytoxan and bactrim for PCP prophylaxis. rec. Prednisone 20 mg reduce to one half daily  Aspirin 81 mg one daily with breakfast but stop for any bleeding  See Patient Care Coordinator before leaving for cardiology appt > saw Dr [name], ? LHC needed   July 21, 2010 ov sob better no longer needing any 02 at all during day, no hemoptysis , producing occ yellow mucus - reports intermittent noct bilateral shoulder pain better with streching, moving, massage. no assoc nausea or sweats. recd try pred 5 mg daily   August 24, 2010 ov cough w/ yellow phlem, breathing is better, using flutter and med calendar consistently. R chest pain with deep breathing no change since R chest tube removed in 05/2010.   October 13, 2010 --Presents for an acute office visit. Complains of productive cough with yellow mucus, sore throat, rattling in chest, low grade temp x3days. Currently on pred 1/2 every other day . OTC not helping. rec continue qod pred, rx with augmentin x 10 days   11/16/2010 ov all smiles, no change on days of prednisone, minimal discolored sputum.   Try off prednisone completely.  If you feel a lot worse  over the next  week(worse breathing/ coughing/ nausea or loss of appetite) resume prednisone 5 mg one half daily until better then return back one half every other day  Late add:  Needs B12 and RBC Folate next ov  12/16/2010 ov/Richard [name] cc no better on 5mg  dosed one half every other day (not the dose rec), still nauseated, still discolored p doxy complete May 7, sob in shower but not as much walking and no longer using 02, struggling with contingency plans. rec Work on inhaler technique:  If short of breath go ahead and use the xopenex hfa 2 puffs every 4 hours  Prednisone 5 mg 4 daily until better, then 2 daily x 5 days, then 1 daily thereafter   12/31/10 ov/Richard [name], exac of bronchiectasis/wheeze no hemoptysis rx with pred burst and levaquin 750 x 5d  01/13/2011 ov/Richard [name]  Cc sob and cough better now tapered prednisone to 5 mg each am using xop once daily around noon no problem with sleep or am exac of cough.   rec  02/11/2011 Richard [name] Richard [name] cc recurrent hemoptysis on top of yellow that usually comes up esp in am x one year but no epistaxis, no cp or arthralgias or increased doe.  rec Levaquin 750 mg x 5 days courses whenever mucus turns bloody, more dark or with high fever.  Stop cytoxan and sulfasoxazole   Don't take aspirin if any active bleeding   03/16/2011 f/u ov/Richard [name] cc no more blood,  Minimal yellow  mucus, back on asa 81 without hemoptysis. No sob  Pt denies any significant sore throat, dysphagia, itching, sneezing,  nasal congestion or excess/ purulent secretions,  fever, chills, sweats, unintended wt loss, pleuritic or exertional cp, hempoptysis, orthopnea pnd or leg swelling.    Also denies any obvious fluctuation of symptoms with weather or environmental changes or other aggravating or alleviating factors.         PMHx:  Allergic rhinitis  GERD- hiatal hernia with reflux  - Repeat Barium Swallow c/w stricture and reflux 03/29/10  Bronchiectasis  - See CT chest 12/14/2009 > progressive  bilateral R> L cavities 03/26/10 > clinical dx Wegener's 04/07/10  - PFTs 02/17/10 FEV1 1.52 (58%) ratio 42 and 15% better after B2, DLC0 118%  WEGENER'S GRANULOMATOSIS  - dX BY pos C anca/ supportive TBBX 04/10/10 > CYTOXAN initiated  > trial off 02/12/2011  - Prednisone tapered off March 2012 > resumed 12/16/2010 daily due to nausea and worse cough PAF/ abn echo during [facility] admit 03/30/10  - ECH0 Aug 23 2011LVH with low nl ef, mild mr, ok R function, peak trop .59  - Not anticoagulated due to WG with Hemoptysis  L PTX 04/25/10  R PTX 05/12/10 > final f/u by [name] 06/01/10  benign hypertrophy of the prostate  Complex med regimen--Meds reviewed with pt education and computerized med calendar May 25, 2010   Family History:  Mother- deceased age 63;car wreck  Father- deceased age 48; breathing problems;asthma  Brother- living age 80; heart CAD (s/p CABG)  Sibling- deceased age 41; heart (s/p CABG)   Social History:  Retired  Never smoker  Married with 3 children  Risk Factors:            Objective:   Physical Exam   amb thin pleasant wm nad  Baseline wt =130  Wt 110 May 10, 2010 > 120 August 24, 2010 >  > 122 11/16/2010 >  115 12/16/2010  > 01/13/2011  118 > 120 02/11/2011 > 127 03/16/2011   amb, up on table without assistance.  SKIN: no rash, lesions  NODES: no lymphadenopathy  HEENT: [place]/AT, EOM- WNL, Conjuctivae- clear, PERRLA, TM-WNL, Nose- clear, Throat- clear and wnl, Mallampati III  NECK: Supple w/ fair ROM, JVD- none, normal carotid impulses w/o bruits Thyroid-  CHEST: Mininimal exp rhonchi bilaterally  HEART: RRR, no m/g/r heard  ABDOMEN: Soft and nl;        cxr 03/16/2011  Chronic reticulonodular interstitial infiltrative densities do not appear to have progressed since previous study. History given of Wegener's granulomatosis. No adenopathy or pleural effusion is seen. There is generalized hyperinflation configuration consistent with obstructive pulmonary disease. There  is central peribronchial thickening. No definite adenopathy is seen.       Assessment & Plan:

## 2011-03-16 NOTE — Assessment & Plan Note (Signed)
rx with cycle of levaquin prn change in sputum    Each maintenance medication was reviewed in detail including most importantly the difference between maintenance and as needed and under what circumstances the prns are to be used.  Please see instructions for details which were reviewed in writing and the patient given a copy.

## 2011-03-16 NOTE — Assessment & Plan Note (Signed)
No evidence of active dz, leave off cytoxan and try taper prednisone to 5 a/w 2.5 daily

## 2011-03-16 NOTE — Patient Instructions (Signed)
Try prednisone 5 mg  One on even and a half on odd   We will call you with the lab results  Please schedule a follow up office visit in 4 weeks, sooner if needed with  cxr on return

## 2011-03-18 LAB — ANCA TITERS: C-ANCA: 1:160 {titer} — ABNORMAL HIGH

## 2011-03-18 LAB — ANCA SCREEN W REFLEX TITER: c-ANCA Screen: POSITIVE — AB

## 2011-03-18 NOTE — Progress Notes (Signed)
Quick Note:  Spoke with pt and notified of results per Dr. [name]. Pt verbalized understanding and denied any questions.  ______ 

## 2011-04-05 IMAGING — CR DG CHEST 2V
2 series · 2 of 2 positions shown · non-contrast
Comparison: 07/21/2010

CLINICAL DATA: Wegener's granulomatosis.  Cough and shortness of
breath.  COPD.  Nonsmoker.

CHEST - 2 VIEW

[view not recorded (1 of 2)]
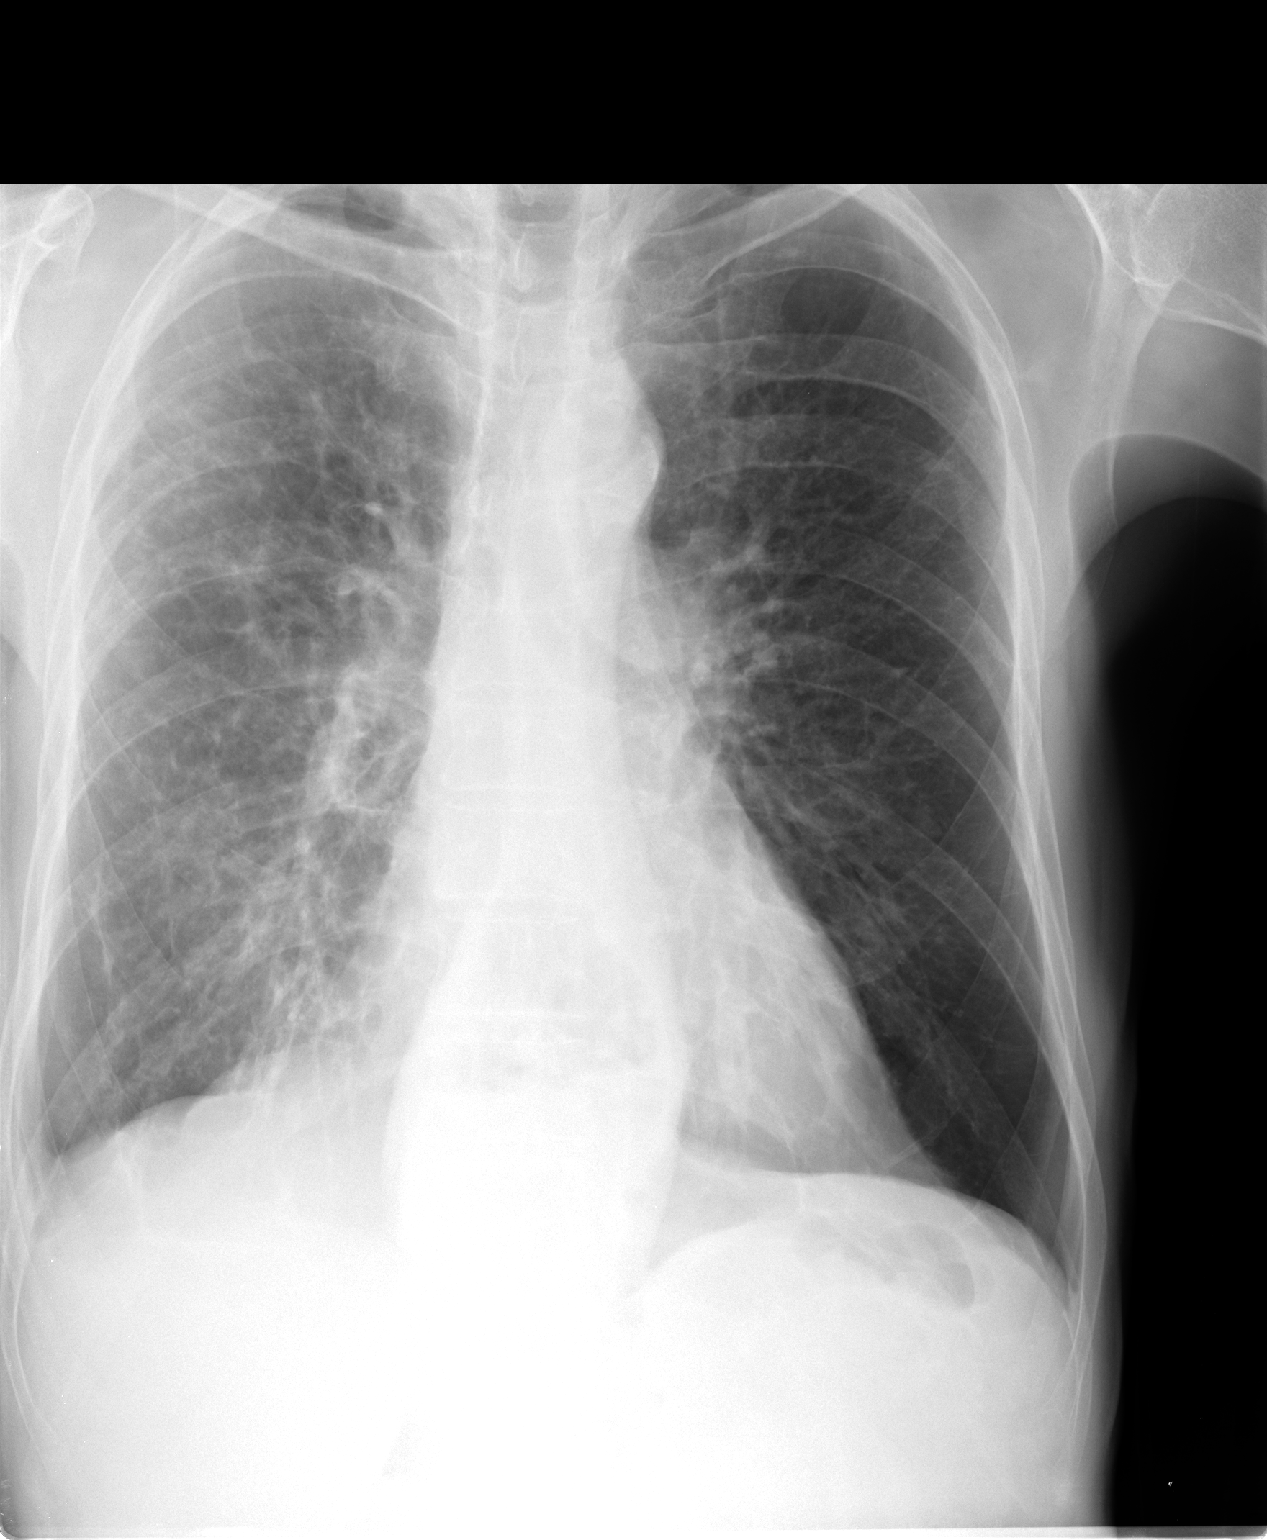

[view not recorded (2 of 2)]
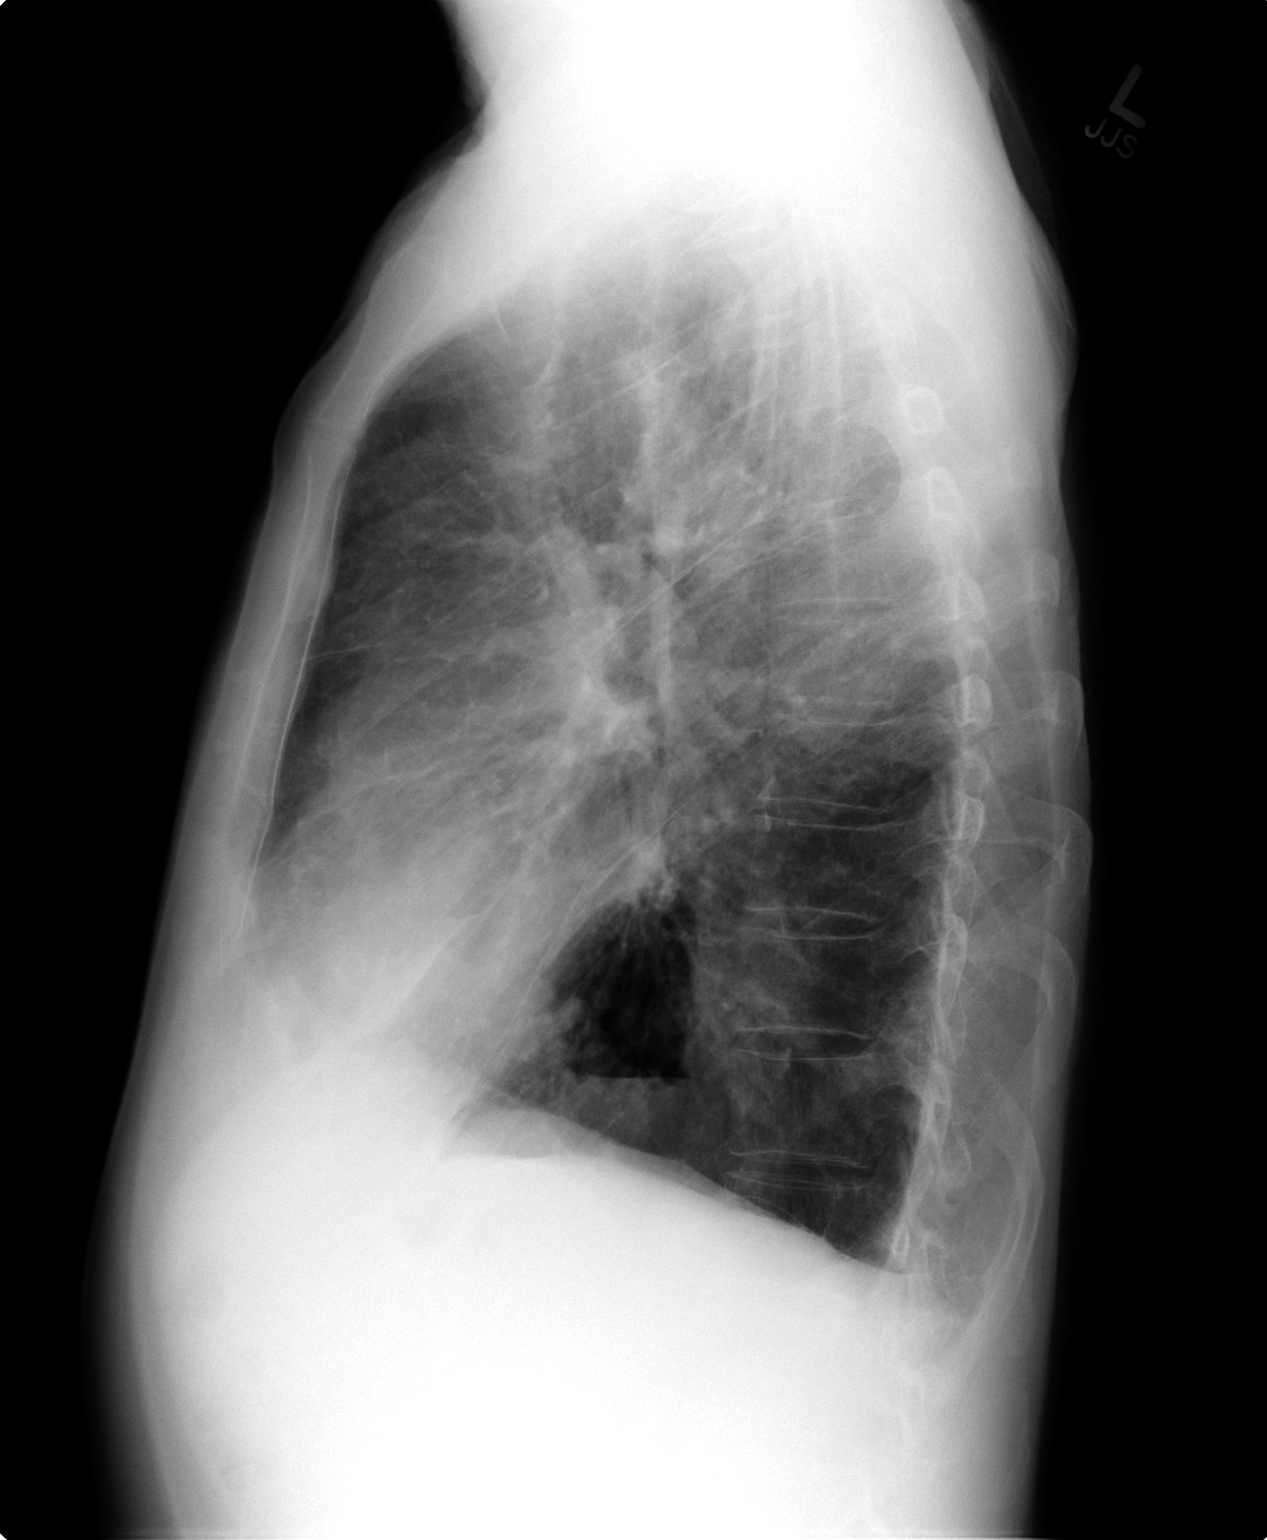

[2 of 2 positions shown; findings below may reference images not displayed]

FINDINGS: Midline trachea.  Normal heart size.  A moderate hiatal
hernia with an air-fluid level within the herniated stomach.

No pleural effusion or pneumothorax.  Reticulonodular
opacities/marked interstitial thickening throughout the right lung.
Stable vague small left upper lobe lung nodules.  No evidence of
acute superimposed process.
IMPRESSION: Chronic changes primarily on the right.  No evidence of acute
superimposed process.

Moderate hiatal hernia with probable dysmotility.

## 2011-04-12 ENCOUNTER — Other Ambulatory Visit: Payer: Self-pay | Admitting: Internal Medicine

## 2011-04-12 DIAGNOSIS — J479 Bronchiectasis, uncomplicated: Secondary | ICD-10-CM

## 2011-04-12 DIAGNOSIS — M3130 Wegener's granulomatosis without renal involvement: Secondary | ICD-10-CM

## 2011-04-13 ENCOUNTER — Ambulatory Visit (INDEPENDENT_AMBULATORY_CARE_PROVIDER_SITE_OTHER): Payer: Medicare Other | Admitting: Internal Medicine

## 2011-04-13 ENCOUNTER — Ambulatory Visit (INDEPENDENT_AMBULATORY_CARE_PROVIDER_SITE_OTHER)
Admission: RE | Admit: 2011-04-13 | Discharge: 2011-04-13 | Disposition: A | Discharge status: Home / Self Care | Payer: Medicare Other | Source: Ambulatory Visit | Attending: Internal Medicine | Admitting: Internal Medicine

## 2011-04-13 ENCOUNTER — Other Ambulatory Visit (INDEPENDENT_AMBULATORY_CARE_PROVIDER_SITE_OTHER): Payer: Medicare Other

## 2011-04-13 ENCOUNTER — Encounter: Payer: Self-pay | Admitting: Internal Medicine

## 2011-04-13 VITALS — BP 136/70 | HR 71 | Temp 98.2°F | Ht 69.0 in | Wt 128.2 lb

## 2011-04-13 DIAGNOSIS — D649 Anemia, unspecified: Secondary | ICD-10-CM

## 2011-04-13 DIAGNOSIS — M3130 Wegener's granulomatosis without renal involvement: Secondary | ICD-10-CM

## 2011-04-13 DIAGNOSIS — J479 Bronchiectasis, uncomplicated: Secondary | ICD-10-CM

## 2011-04-13 LAB — CBC WITH DIFFERENTIAL/PLATELET
Basophils Absolute: 0 10*3/uL (ref 0.0–0.1)
Eosinophils Absolute: 0 10*3/uL (ref 0.0–0.7)
Lymphocytes Relative: 13.9 % (ref 12.0–46.0)
MCHC: 34.1 g/dL (ref 30.0–36.0)
MCV: 104.6 fl — ABNORMAL HIGH (ref 78.0–100.0)
Monocytes Absolute: 0.6 10*3/uL (ref 0.1–1.0)
Neutrophils Relative %: 76 % (ref 43.0–77.0)
Platelets: 246 10*3/uL (ref 150.0–400.0)
RBC: 3.27 Mil/uL — ABNORMAL LOW (ref 4.22–5.81)

## 2011-04-13 LAB — URINALYSIS
Hgb urine dipstick: NEGATIVE
Ketones, ur: NEGATIVE
Leukocytes, UA: NEGATIVE
Specific Gravity, Urine: 1.025 (ref 1.000–1.030)
Urine Glucose: NEGATIVE
Urobilinogen, UA: 0.2 (ref 0.0–1.0)

## 2011-04-13 LAB — BASIC METABOLIC PANEL
Chloride: 107 mEq/L (ref 96–112)
GFR: 61.34 mL/min (ref >60.00)
Potassium: 5 mEq/L (ref 3.5–5.1)
Sodium: 141 mEq/L (ref 135–145)

## 2011-04-13 LAB — SEDIMENTATION RATE: Sed Rate: 28 mm/hr — ABNORMAL HIGH (ref 0–22)

## 2011-04-13 NOTE — Progress Notes (Signed)
Subjective:    Patient ID: Richard [name], male    DOB: 1935/12/16, 75 y.o.   MRN: [id number]  HPI  58 yowm never smoker with dx of Longstanding bronchiectasis then dx with WG 03/2010 > requested establish with Richard [name].   July 08, 2009- Bronchiectasis, chronic bronchits, allergic rhinitis  Had flu vax and has had at least 2 pneumovax.   Admit [facility] dx WG by pos c anca 03/2010, supportive tbbx but not vats   Admit [name] dx spont L Ptx 9/08/15/09 requiring Chest tube but no surgery   05/24/10--NP ov/ med reivew. Since last visit. has been hospitalized 05/12/2010-- 10/10/2011for Right pneumothorax., with underlying Wegener granulomatosis. and . Bronchiectasis. Pt was found to have Right Pneumo. Chest tube was placed. with improvement and removed 10/7. Xray on 10/10 w/ resolved pnuemo. He was continued on cytoxan and bactrim for PCP prophylaxis. rec. Prednisone 20 mg reduce to one half daily  Aspirin 81 mg one daily with breakfast but stop for any bleeding  See Patient Care Coordinator before leaving for cardiology appt > saw Dr [name], ? LHC needed   July 21, 2010 ov sob better no longer needing any 02 at all during day, no hemoptysis , producing occ yellow mucus - reports intermittent noct bilateral shoulder pain better with streching, moving, massage. no assoc nausea or sweats. recd try pred 5 mg daily   August 24, 2010 ov cough w/ yellow phlem, breathing is better, using flutter and med calendar consistently. R chest pain with deep breathing no change since R chest tube removed in 05/2010.   October 13, 2010 --Presents for an acute office visit. Complains of productive cough with yellow mucus, sore throat, rattling in chest, low grade temp x3days. Currently on pred 1/2 every other day . OTC not helping. rec continue qod pred, rx with augmentin x 10 days   11/16/2010 ov all smiles, no change on days of prednisone, minimal discolored sputum.   Try off prednisone completely.  If you feel a lot worse  over the next  week(worse breathing/ coughing/ nausea or loss of appetite) resume prednisone 5 mg one half daily until better then return back one half every other day  Late add:  Needs B12 and RBC Folate next ov  12/16/2010 ov/Richard [name] cc no better on 5mg  dosed one half every other day (not the dose rec), still nauseated, still discolored p doxy complete May 7, sob in shower but not as much walking and no longer using 02, struggling with contingency plans. rec Work on inhaler technique:  If short of breath go ahead and use the xopenex hfa 2 puffs every 4 hours  Prednisone 5 mg 4 daily until better, then 2 daily x 5 days, then 1 daily thereafter   12/31/10 ov/[name], exac of bronchiectasis/wheeze no hemoptysis rx with pred burst and levaquin 750 x 5d  01/13/2011 ov/[name]  Cc sob and cough better now tapered prednisone to 5 mg each am using xop once daily around noon no problem with sleep or am exac of cough.   rec  02/11/2011 [name] cc recurrent hemoptysis on top of yellow that usually comes up esp in am x one year but no epistaxis, no cp or arthralgias or increased doe.  rec Levaquin 750 mg x 5 days courses whenever mucus turns bloody, more dark or with high fever.  Stop cytoxan and sulfasoxazole   Don't take aspirin if any active bleeding   03/16/2011 f/u ov/Malcolm Quast cc no more blood,  Minimal yellow  mucus, back on asa 81 without hemoptysis. No sob rec  Try prednisone 5 mg  One on even and a half on odd     04/13/2011 ov/ Richard [name] cc no cough, sob, rash, arthritis. No perceived change between the days of one vs a half  Pt denies any significant sore throat, dysphagia, itching, sneezing,  nasal congestion or excess/ purulent secretions,  fever, chills, sweats, unintended wt loss, pleuritic or exertional cp, hempoptysis, orthopnea pnd or leg swelling.  Also denies presyncope, palpitations, heartburn, abdominal pain, nausea, vomiting, diarrhea  or change in bowel or urinary habits, dysuria,hematuria,  rash,  arthralgias, visual complaints, headache, numbness weakness or ataxia.  Also denies any obvious fluctuation of symptoms with weather or environmental changes or other aggravating or alleviating factors.          PMHx:  Allergic rhinitis  GERD- hiatal hernia with reflux  - Repeat Barium Swallow c/w stricture and reflux 03/29/10  Bronchiectasis  - See CT chest 12/14/2009 > progressive bilateral R> L cavities 03/26/10 > clinical dx Wegener's 04/07/10  - PFTs 02/17/10 FEV1 1.52 (58%) ratio 42 and 15% better after B2, DLC0 118%  WEGENER'S GRANULOMATOSIS  - dX BY pos C anca/ supportive TBBX 04/10/10 > CYTOXAN initiated  > trial off 02/12/2011  - Prednisone tapered off March 2012 > resumed 12/16/2010 daily due to nausea and worse cough PAF/ abn echo during [facility] admit 03/30/10  - ECH0 Aug 23 2011LVH with low nl ef, mild mr, ok R function, peak trop .59  - Not anticoagulated due to WG with Hemoptysis  L PTX 04/25/10  R PTX 05/12/10 > final f/u by [name] 06/01/10  benign hypertrophy of the prostate  Complex med regimen--Meds reviewed with pt education and computerized med calendar May 25, 2010   Family History:  Mother- deceased age 15;car wreck  Father- deceased age 25; breathing problems;asthma  Brother- living age 33; heart CAD (s/p CABG)  Sibling- deceased age 23; heart (s/p CABG)   Social History:  Retired  Never smoker  Married with 3 children             Objective:   Physical Exam   amb thin pleasant wm nad    Wt 110 May 10, 2010 > 120 August 24, 2010 >  > 122 11/16/2010 > > 127 03/16/2011 > 04/13/2011  128  amb, up on table without assistance.  SKIN: no rash, lesions  NODES: no lymphadenopathy  HEENT: [place]/AT, EOM- WNL, Conjuctivae- clear, PERRLA, TM-WNL, Nose- clear, Throat- clear and wnl, Mallampati III  NECK: Supple w/ fair ROM, JVD- none, normal carotid impulses w/o bruits Thyroid-  CHEST: Mininimal exp rhonchi bilaterally  HEART: RRR, no m/g/r heard  ABDOMEN: Soft and  nl      CXR  04/13/2011 :  COPD with asymmetric scarring on the right is stable. No superimposed acute abnormality.          Assessment & Plan:

## 2011-04-13 NOTE — Patient Instructions (Addendum)
We will check your kidney functions to see if any Activity is present but the key to remember to look the same symptoms you had before  Prednisone reduce it to every 3rd day take a whole through September then Oct 1 reduce to one half daily  Please schedule a follow up office visit in 6 weeks, call sooner if needed

## 2011-04-14 ENCOUNTER — Encounter: Payer: Self-pay | Admitting: Internal Medicine

## 2011-04-14 NOTE — Assessment & Plan Note (Signed)
I had an extended discussion with the patient today lasting 15 to 20 minutes of a 25 minute visit on the following issues:  Despite Richard [name] turning pos off cytoxan he has absolutely no evidence of a systemic vasculitis though is at risk of clinically significant recurrence.  Discussed in detail all the  indications, usual  risks and alternatives  relative to the benefits with patient who agrees to proceed with conservative f/u off ctx and call if condition worsens

## 2011-04-14 NOTE — Assessment & Plan Note (Signed)
Definitely trending better off ctx with mcv almost nl now so this is likely a toxic effect of ctx, further supporting avoidance of ctx if possible, low threshold to send to rheum for alternative rx if WG recurs.

## 2011-04-15 ENCOUNTER — Telehealth: Payer: Self-pay | Admitting: Internal Medicine

## 2011-04-15 NOTE — Telephone Encounter (Signed)
Call patient : Studies are unremarkable, no change in recs - looking good, no evidence of Wegeners  I spoke with patient about results and he verbalized understanding and had no questions

## 2011-04-22 ENCOUNTER — Other Ambulatory Visit: Payer: Self-pay | Admitting: Internal Medicine

## 2011-04-25 ENCOUNTER — Telehealth: Payer: Self-pay | Admitting: Internal Medicine

## 2011-04-25 NOTE — Telephone Encounter (Signed)
I spoke with pt and he states he has had a sore throat, cough , low grade fever x Friday. Offered apt with TP in [place] in the am but refused. Pt is coming in to see RB at 10:30

## 2011-04-26 ENCOUNTER — Ambulatory Visit (INDEPENDENT_AMBULATORY_CARE_PROVIDER_SITE_OTHER): Payer: Medicare Other | Admitting: Emergency Medicine

## 2011-04-26 ENCOUNTER — Encounter: Payer: Self-pay | Admitting: Emergency Medicine

## 2011-04-26 DIAGNOSIS — M3130 Wegener's granulomatosis without renal involvement: Secondary | ICD-10-CM

## 2011-04-26 DIAGNOSIS — J479 Bronchiectasis, uncomplicated: Secondary | ICD-10-CM

## 2011-04-26 NOTE — Patient Instructions (Signed)
Please start levaquin daily for another 5 days Continue your current prednisone dosing  Start loratadine 10mg  daily until your next visit Follow with Dr [name] as planned, or sooner if you are not improving

## 2011-04-26 NOTE — Assessment & Plan Note (Signed)
With recent increase purulent mucous. Discussed with him that he will likely be clearing some yellow phlegm indefinitely given his Btx, but he appears to be flaring now. ? Due to mowing grass, recent allergic flare, nasal drainage.  - another round 5 days levaquin - no wheeze today, continue same pred 5/2.5mg  qod - trial loratadine qd until f/u w Dr [name] - no evidence today for active WG, but will need to be vigilant for this, ? Repeat serologies or CT scan if these sx continue when he follows w Dr [name]

## 2011-04-26 NOTE — Progress Notes (Signed)
Subjective:    Patient ID: Richard [name], male    DOB: 10/03/1935, 75 y.o.   MRN: [id number]  HPI 12 yowm never smoker with dx of Longstanding bronchiectasis then dx with WG 03/2010 > requested establish with [name].   July 08, 2009- Bronchiectasis, chronic bronchits, allergic rhinitis  Had flu vax and has had at least 2 pneumovax.   Admit [facility] dx WG by pos c anca 03/2010, supportive tbbx but not vats   Admit [name] dx spont L Ptx 9/08/15/09 requiring Chest tube but no surgery   05/24/10--NP ov/ med reivew. Since last visit. has been hospitalized 05/12/2010-- 10/10/2011for Right pneumothorax., with underlying Wegener granulomatosis. and . Bronchiectasis. Pt was found to have Right Pneumo. Chest tube was placed. with improvement and removed 10/7. Xray on 10/10 w/ resolved pnuemo. He was continued on cytoxan and bactrim for PCP prophylaxis. rec. Prednisone 20 mg reduce to one half daily  Aspirin 81 mg one daily with breakfast but stop for any bleeding  See Patient Care Coordinator before leaving for cardiology appt > saw Dr [name], ? LHC needed   July 21, 2010 ov sob better no longer needing any 02 at all during day, no hemoptysis , producing occ yellow mucus - reports intermittent noct bilateral shoulder pain better with streching, moving, massage. no assoc nausea or sweats. recd try pred 5 mg daily   August 24, 2010 ov cough w/ yellow phlem, breathing is better, using flutter and med calendar consistently. R chest pain with deep breathing no change since R chest tube removed in 05/2010.   October 13, 2010 --Presents for an acute office visit. Complains of productive cough with yellow mucus, sore throat, rattling in chest, low grade temp x3days. Currently on pred 1/2 every other day . OTC not helping. rec continue qod pred, rx with augmentin x 10 days   11/16/2010 ov all smiles, no change on days of prednisone, minimal discolored sputum.   Try off prednisone completely.  If you feel a lot worse  over the next  week(worse breathing/ coughing/ nausea or loss of appetite) resume prednisone 5 mg one half daily until better then return back one half every other day  Late add:  Needs B12 and RBC Folate next ov  12/16/2010 ov/[name] cc no better on 5mg  dosed one half every other day (not the dose rec), still nauseated, still discolored p doxy complete May 7, sob in shower but not as much walking and no longer using 02, struggling with contingency plans. rec Work on inhaler technique:  If short of breath go ahead and use the xopenex hfa 2 puffs every 4 hours  Prednisone 5 mg 4 daily until better, then 2 daily x 5 days, then 1 daily thereafter   12/31/10 ov/[name], exac of bronchiectasis/wheeze no hemoptysis rx with pred burst and levaquin 750 x 5d  01/13/2011 ov/[name]  Cc sob and cough better now tapered prednisone to 5 mg each am using xop once daily around noon no problem with sleep or am exac of cough.   rec  02/11/2011 [name] cc recurrent hemoptysis on top of yellow that usually comes up esp in am x one year but no epistaxis, no cp or arthralgias or increased doe.  rec Levaquin 750 mg x 5 days courses whenever mucus turns bloody, more dark or with high fever.  Stop cytoxan and sulfasoxazole   Don't take aspirin if any active bleeding   03/16/2011 f/u ov/[name] cc no more blood,  Minimal yellow mucus,  back on asa 81 without hemoptysis. No sob rec  Try prednisone 5 mg  One on even and a half on odd  04/13/2011 ov/ [name] cc no cough, sob, rash, arthritis. No perceived change between the days of one vs a half  Pt denies any significant sore throat, dysphagia, itching, sneezing,  nasal congestion or excess/ purulent secretions,  fever, chills, sweats, unintended wt loss, pleuritic or exertional cp, hempoptysis, orthopnea pnd or leg swelling.  Also denies presyncope, palpitations, heartburn, abdominal pain, nausea, vomiting, diarrhea  or change in bowel or urinary habits, dysuria,hematuria,  rash,  arthralgias, visual complaints, headache, numbness weakness or ataxia.  Also denies any obvious fluctuation of symptoms with weather or environmental changes or other aggravating or alleviating factors.      Acute Visit 04/26/11 -- followed by Dr [name] for Wegeners, bronchiectasis. Presents today complaining of increased chest congestion, yellow sputum beginning about 1 week ago. He took a levaquin x 5 days (9/11 - 9/15). Has a sore throat, getting better. Nasal drainage and sinus drainage worse over last 3 -4 days. He tells me that he cut grass last Thursday, may have started this. No sick contacts.     PMHx:  Allergic rhinitis  GERD- hiatal hernia with reflux  - Repeat Barium Swallow c/w stricture and reflux 03/29/10  Bronchiectasis  - See CT chest 12/14/2009 > progressive bilateral R> L cavities 03/26/10 > clinical dx Wegener's 04/07/10  - PFTs 02/17/10 FEV1 1.52 (58%) ratio 42 and 15% better after B2, DLC0 118%  WEGENER'S GRANULOMATOSIS  - dX BY pos C anca/ supportive TBBX 04/10/10 > CYTOXAN initiated  > trial off 02/12/2011  - Prednisone tapered off March 2012 > resumed 12/16/2010 daily due to nausea and worse cough PAF/ abn echo during [facility] 03/30/10  - ECH0 Aug 23 2011LVH with low nl ef, mild mr, ok R function, peak trop .59  - Not anticoagulated due to WG with Hemoptysis  L PTX 04/25/10  R PTX 05/12/10 > final f/u by [name] 06/01/10  benign hypertrophy of the prostate  Complex med regimen--Meds reviewed with pt education and computerized med calendar May 25, 2010   Family History:  Mother- deceased age 30;car wreck  Father- deceased age 24; breathing problems;asthma  Brother- living age 53; heart CAD (s/p CABG)  Sibling- deceased age 26; heart (s/p CABG)   Social History:  Retired  Never smoker  Married with 3 children      Objective:  Physical Exam   amb thin pleasant wm nad  SKIN: no rash, lesions  NODES: no lymphadenopathy  HEENT: [place]/AT, EOM- WNL, Conjuctivae- clear,  PERRLA, TM-WNL, Nose- clear, Throat- clear and wnl, Mallampati III  NECK: Supple w/ fair ROM, JVD- none, normal carotid impulses w/o bruits Thyroid-  CHEST: no wheeze, few bilateral rhonchi HEART: RRR, no m/g/r heard  ABDOMEN: Soft and nl      CXR  04/13/2011 : COPD with asymmetric scarring on the right is stable. No superimposed acute abnormality.  Assessment & Plan:  BRONCHIECTASIS With recent increase purulent mucous. Discussed with him that he will likely be clearing some yellow phlegm indefinitely given his Btx, but he appears to be flaring now. ? Due to mowing grass, recent allergic flare, nasal drainage.  - another round 5 days levaquin - no wheeze today, continue same pred 5/2.5mg  qod - trial loratadine qd until f/u w Dr [name] - no evidence today for active WG, but will need to be vigilant for this, ? Repeat serologies or  CT scan if these sx continue when he follows w Dr [name]   Wegener's granulomatosis No clinical evidence active disease. Will likely need repeat serologies, possibly CT scan if current clinical picture continues.

## 2011-04-26 NOTE — Assessment & Plan Note (Signed)
No clinical evidence active disease. Will likely need repeat serologies, possibly CT scan if current clinical picture continues.

## 2011-04-27 ENCOUNTER — Telehealth: Payer: Self-pay | Admitting: Emergency Medicine

## 2011-04-27 MED ORDER — LORATADINE 10 MG PO TABS: 10.0000 mg | ORAL_TABLET | Freq: Every day | ORAL | Status: DC

## 2011-04-27 NOTE — Telephone Encounter (Signed)
I spoke with pt and he states his rx for loratadine was not called in yesterday. I advised pt will send rx for him

## 2011-05-17 IMAGING — CR DG CHEST 2V
2 series · 2 of 2 positions shown · non-contrast
Comparison: 08/24/2010

CLINICAL DATA: Cough, short of breath

CHEST - 2 VIEW

[view not recorded (1 of 2)]
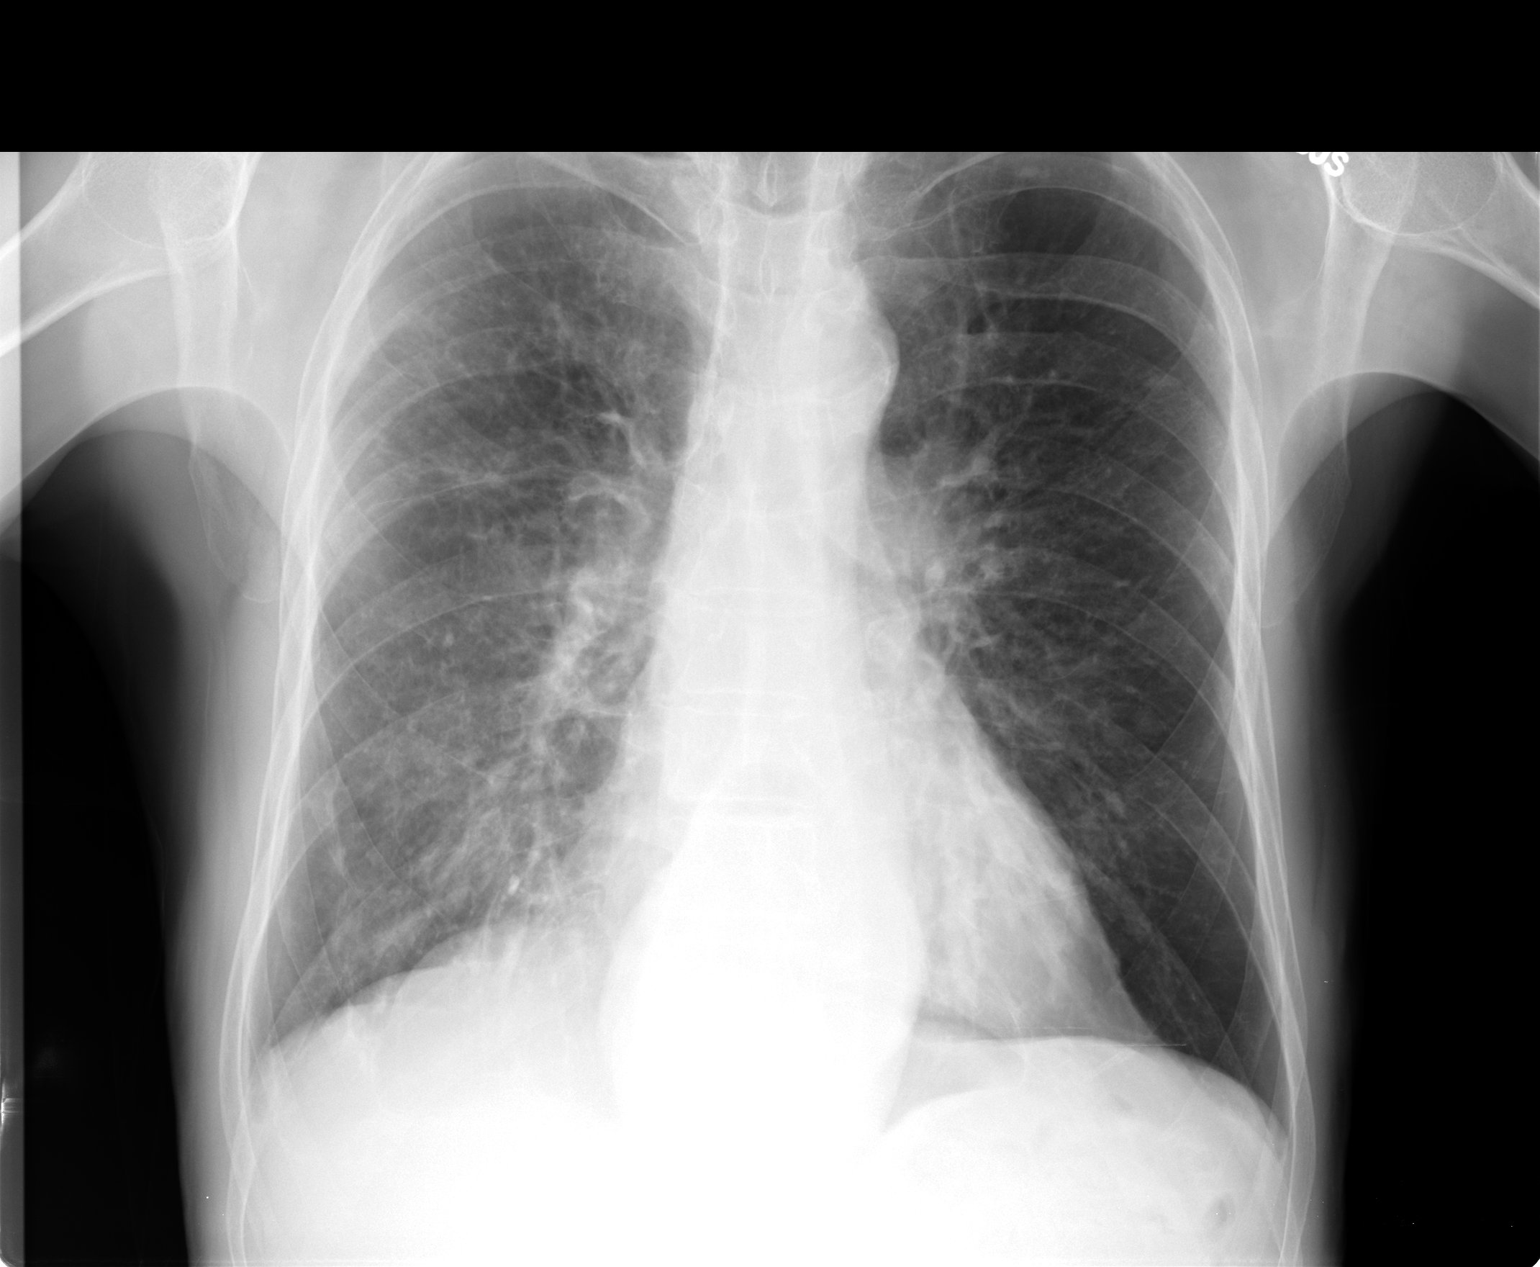

[view not recorded (2 of 2)]
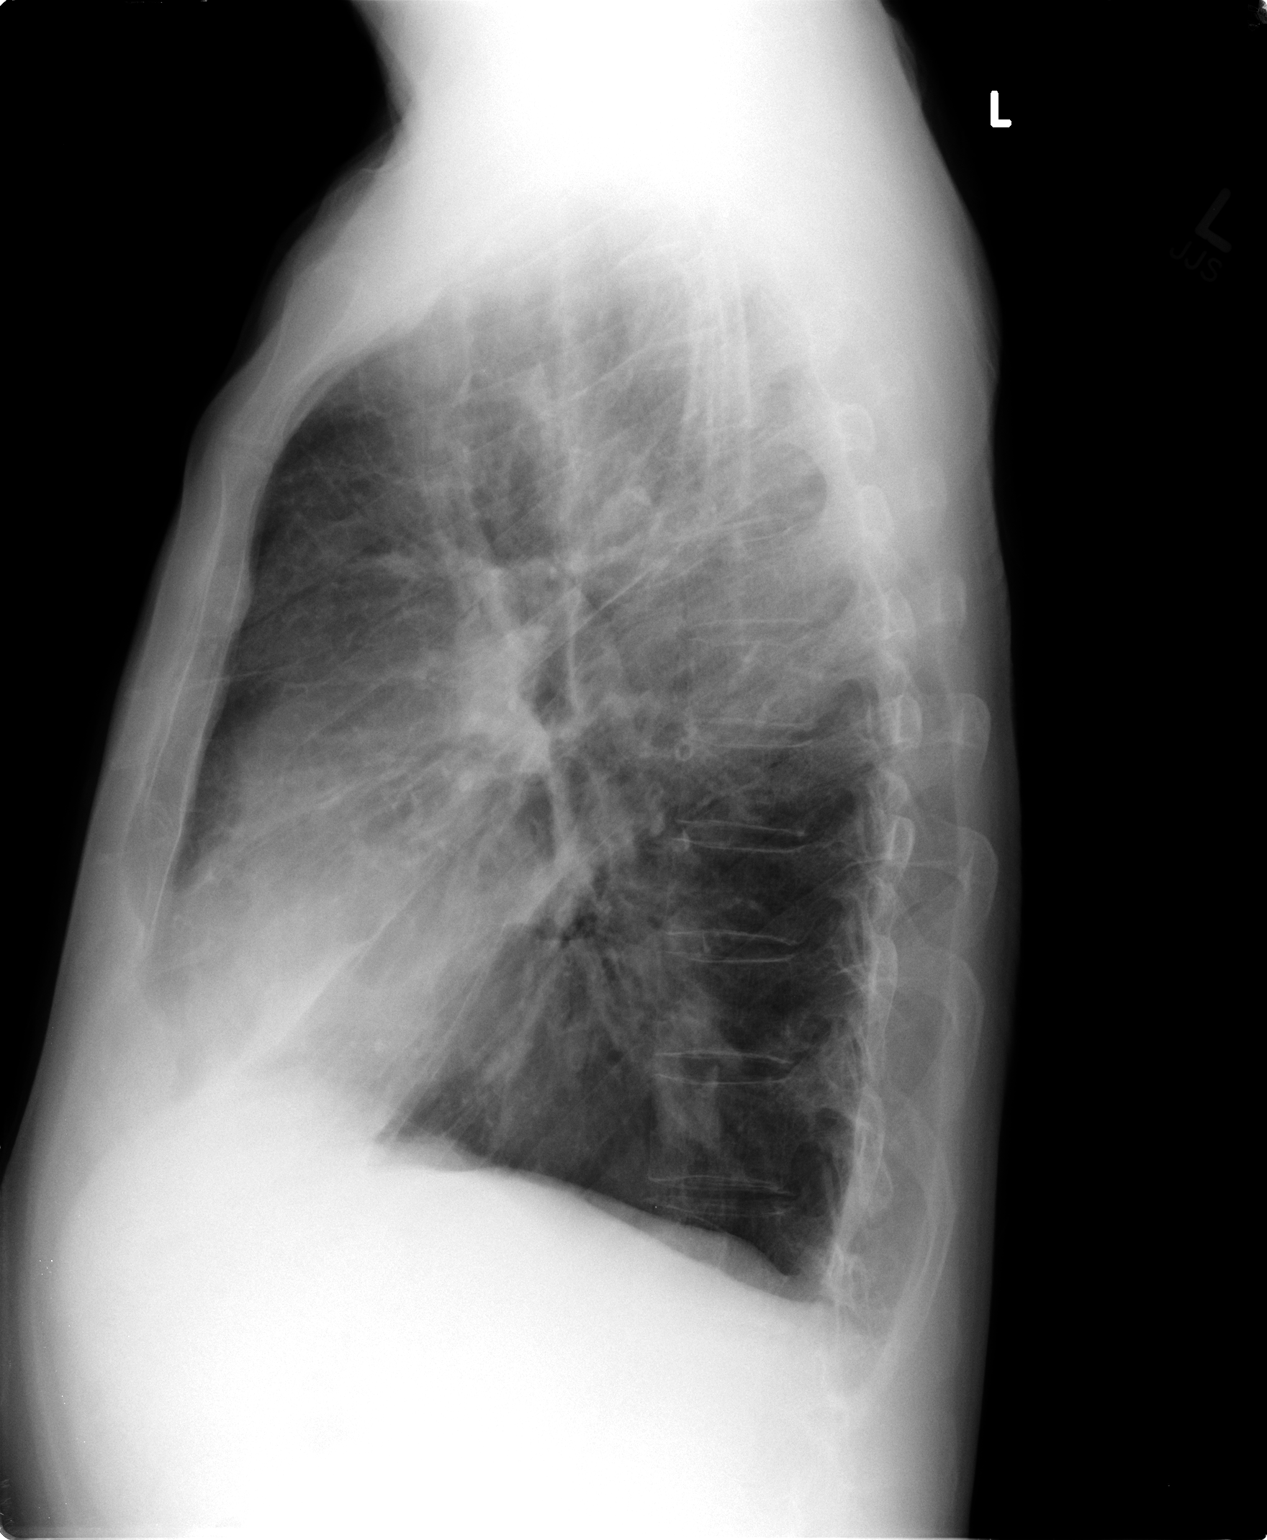

[2 of 2 positions shown; findings below may reference images not displayed]

FINDINGS: COPD with hyperinflation.  There is pulmonary scarring,
worse on the right than the left.  This is stable.  No superimposed
pneumonia or edema.  Hiatal hernia is noted.
IMPRESSION: COPD and scarring.  No significant change from prior study.

## 2011-05-27 ENCOUNTER — Encounter: Payer: Self-pay | Admitting: Internal Medicine

## 2011-05-27 ENCOUNTER — Ambulatory Visit (INDEPENDENT_AMBULATORY_CARE_PROVIDER_SITE_OTHER): Payer: Medicare Other | Admitting: Internal Medicine

## 2011-05-27 VITALS — BP 124/70 | HR 72 | Temp 98.2°F | Ht 67.0 in | Wt 130.6 lb

## 2011-05-27 DIAGNOSIS — J479 Bronchiectasis, uncomplicated: Secondary | ICD-10-CM

## 2011-05-27 DIAGNOSIS — Z23 Encounter for immunization: Secondary | ICD-10-CM

## 2011-05-27 NOTE — Progress Notes (Signed)
Subjective:    Patient ID: Richard [name], male    DOB: September 01, 1935, 75 y.o.   MRN: [id number]  HPI 17 yowm never smoker with dx of Longstanding [name] then dx with WG 8/[name] > requested establish with Richard [name].   July 08, 2009- [name], chronic bronchits, allergic rhinitis  Had flu vax and has had at least 2 pneumovax.   Admit [facility] dx WG by pos c anca 8/[name], supportive tbbx but not vats   Admit [name] dx spont L Ptx 9/08/15/09 requiring Chest tube but no surgery   05/24/10--NP ov/ med reivew. Since last visit. has been hospitalized 10/05/[name]-- 10/10/2011for Right pneumothorax., with underlying Wegener granulomatosis. and . [name]. Pt was found to have Right Pneumo. Chest tube was placed. with improvement and removed 10/7. Xray on 10/10 w/ resolved pnuemo. He was continued on cytoxan and bactrim for PCP prophylaxis. rec. Prednisone 20 mg reduce to one half daily  Aspirin 81 mg one daily with breakfast but stop for any bleeding  See Patient Care Coordinator before leaving for cardiology appt > saw Dr Richard [name], ? LHC needed   December 14, [name] ov sob better no longer needing any 02 at all during day, no hemoptysis , producing occ yellow mucus - reports intermittent noct bilateral shoulder pain better with streching, moving, massage. no assoc nausea or sweats. recd try pred 5 mg daily   August 24, 2010 ov cough w/ yellow phlem, breathing is better, using flutter and med calendar consistently. R chest pain with deep breathing no change since R chest tube removed in 10/[name].   October 13, 2010 --Presents for an acute office visit. Complains of productive cough with yellow mucus, sore throat, rattling in chest, low grade temp x3days. Currently on pred 1/2 every other day . OTC not helping. rec continue qod pred, rx with augmentin x 10 days   11/16/2010 ov all smiles, no change on days of prednisone, minimal discolored sputum.   Try off prednisone completely.  If you feel a lot worse  over the next  week(worse breathing/ coughing/ nausea or loss of appetite) resume prednisone 5 mg one half daily until better then return back one half every other day  Late add:  Needs B12 and RBC Folate next ov  12/16/2010 ov/Richard [name] cc no better on 5mg  dosed one half every other day (not the dose rec), still nauseated, still discolored p doxy complete May 7, sob in shower but not as much walking and no longer using 02, struggling with contingency plans. rec Work on inhaler technique:  If short of breath go ahead and use the xopenex hfa 2 puffs every 4 hours  Prednisone 5 mg 4 daily until better, then 2 daily x 5 days, then 1 daily thereafter   12/31/10 ov/Richard [name]/wheeze no hemoptysis rx with pred burst and levaquin 750 x 5d  01/13/2011 ov/Richard [name]  Cc sob and cough better now tapered prednisone to 5 mg each am using xop once daily around noon no problem with sleep or am exac of cough.   rec  02/11/2011 Richard [name] cc recurrent hemoptysis on top of yellow that usually comes up esp in am x one year but no epistaxis, no cp or arthralgias or increased doe.  rec Levaquin 750 mg x 5 days courses whenever mucus turns bloody, more dark or with high fever.  Stop cytoxan and sulfasoxazole   Don't take aspirin if any active bleeding   03/16/2011 f/u ov/Richard [name] cc no more blood,  Minimal yellow mucus,  back on asa 81 without hemoptysis. No sob rec  Try prednisone 5 mg  One on even and a half on odd  04/13/2011 ov/ Richard [name] cc no cough, sob, rash, arthritis. No perceived change between the days of one vs a half rec          Acute Visit 04/26/11 -Richard [name]  Cc  increased chest congestion, yellow sputum beginning about 1 week ago. He took a levaquin x 5 days (9/11 - 9/15). Has a sore throat, getting better. Nasal drainage and sinus drainage worse over last 3 -4 days. He tells me that he cut grass last Thursday, may have started this. No sick contacts.  rec Please start levaquin daily for another 5  days Continue your current prednisone dosing  Start loratadine 10mg  daily until your next visit    05/27/2011 f/u ov/Richard [name] on 2.5 mg pred /day  cc still coughing, congested though much less purulent sputum p levaquin and no hemoptysis.  Does note fatigue > baseline  With doe worse x garbage cans to street and back and has not tried the xopenex at all.  Pt denies any significant sore throat, dysphagia, itching, sneezing,  nasal congestion or excess/ purulent secretions,  fever, chills, sweats, unintended wt loss, pleuritic or exertional cp, hempoptysis, orthopnea pnd or leg swelling.  Also denies presyncope, palpitations, heartburn, abdominal pain, nausea, vomiting, diarrhea  or change in bowel or urinary habits, dysuria,hematuria,  rash, arthralgias, visual complaints, headache, numbness weakness or ataxia.  Also denies any obvious fluctuation of symptoms with weather or environmental changes or other aggravating or alleviating factors.    PMHx:  Allergic rhinitis  GERD- hiatal hernia with reflux  - Repeat Barium Swallow c/w stricture and reflux 03/29/10  [name]  - See CT chest 5/9/[name] > progressive bilateral R> L cavities 03/26/10 > clinical dx Wegener's 04/07/10  - PFTs 02/17/10 FEV1 1.52 (58%) ratio 42 and 15% better after B2, DLC0 118%  WEGENER'S GRANULOMATOSIS  - dX BY pos C anca/ supportive TBBX 04/10/10 > CYTOXAN initiated  > trial off 02/12/2011  - Prednisone tapered off March 2012 > resumed 12/16/2010 daily due to nausea and worse cough PAF/ abn echo during [facility] admit 03/30/10  - ECH0 Aug 23 2011LVH with low nl ef, mild mr, ok R function, peak trop .59  - Not anticoagulated due to WG with Hemoptysis  L PTX 04/25/10  R PTX 05/12/10 > final f/u by Richard [name] 06/01/10  benign hypertrophy of the prostate  Complex med regimen--Meds reviewed with pt education and computerized med calendar Richard [name]   Family History:  Mother- deceased age 97;car wreck  Father- deceased age 71;  breathing problems;asthma  Brother- living age 45; heart CAD (s/p CABG)  Sibling- deceased age 4; heart (s/p CABG)   Social History:  Retired  Never smoker  Married with 3 children      Objective:  Physical Exam  wt   128  04/26/11  >  130   05/27/2011  amb thin pleasant wm nad  SKIN: no rash, lesions  NODES: no lymphadenopathy  HEENT: [place]/AT, EOM- WNL, Conjuctivae- clear, PERRLA, TM-WNL, Nose- clear, Throat- clear and wnl, Mallampati III  NECK: Supple w/ fair ROM, JVD- none, normal carotid impulses w/o bruits Thyroid-  CHEST: no wheeze, few bilateral rhonchi insp  And exp HEART: RRR, no m/g/r heard  ABDOMEN: Soft and nl      CXR  04/13/2011 : COPD with asymmetric scarring on the right is stable. No superimposed acute abnormality.  Assessment & Plan:

## 2011-05-27 NOTE — Patient Instructions (Addendum)
See calendar for specific medication instructions and bring it back for each and every office visit for every healthcare provider you see.  Without it,  you may not receive the best quality medical care that we feel you deserve.  You will note that the calendar groups together  your maintenance  medications that are timed at particular times of the day.  Think of this as your checklist for what your doctor has instructed you to do until your next evaluation to see what benefit  there is  to staying on a consistent group of medications intended to keep you well.  The other group at the bottom is entirely up to you to use as you see fit  for specific symptoms that may arise between visits that require you to treat them on an as needed basis.  Think of this as your action plan or "what if" list.   Separating the top medications from the bottom group is fundamental to providing you adequate care going forward.    Try using xopenex as needed for short of breath  Only take the loratidine (clariton) if needed for itching sneezing runny nose   Please schedule a follow up office visit in 6 weeks, call sooner if needed

## 2011-05-28 ENCOUNTER — Encounter: Payer: Self-pay | Admitting: Internal Medicine

## 2011-05-28 NOTE — Assessment & Plan Note (Signed)
Improved p recent exac of bronchiectasis  May benefit from more aggressive airway rx > try increase xopenex and return for pfts

## 2011-06-12 ENCOUNTER — Other Ambulatory Visit: Payer: Self-pay | Admitting: Internal Medicine

## 2011-06-21 ENCOUNTER — Ambulatory Visit (INDEPENDENT_AMBULATORY_CARE_PROVIDER_SITE_OTHER): Payer: Medicare Other | Admitting: Internal Medicine

## 2011-06-21 DIAGNOSIS — J479 Bronchiectasis, uncomplicated: Secondary | ICD-10-CM

## 2011-06-21 LAB — PULMONARY FUNCTION TEST

## 2011-06-21 NOTE — Progress Notes (Signed)
PFT done today. 

## 2011-06-26 ENCOUNTER — Encounter: Payer: Self-pay | Admitting: Internal Medicine

## 2011-06-28 IMAGING — CR DG CHEST 2V
2 series · 2 of 2 positions shown · non-contrast
Comparison: 10/05/2010

CLINICAL DATA: N[place]

CHEST - 2 VIEW

[view not recorded (1 of 2)]
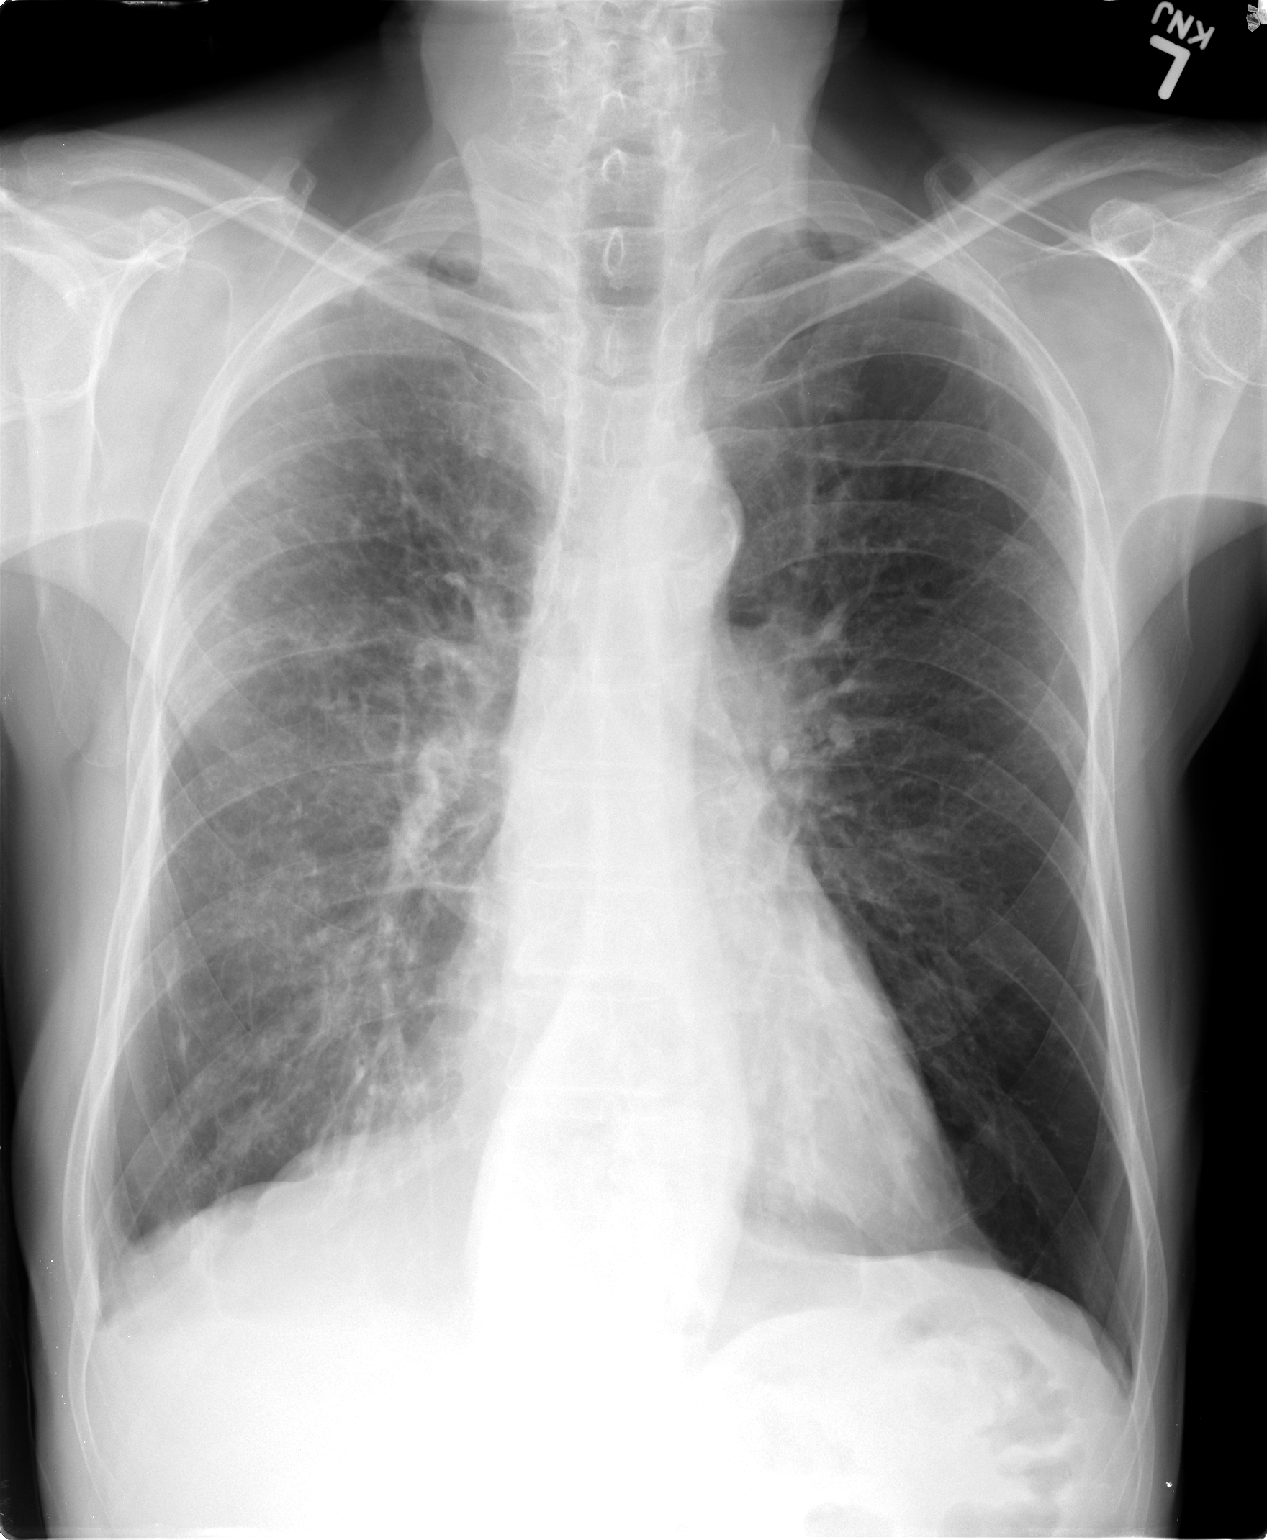

[view not recorded (2 of 2)]
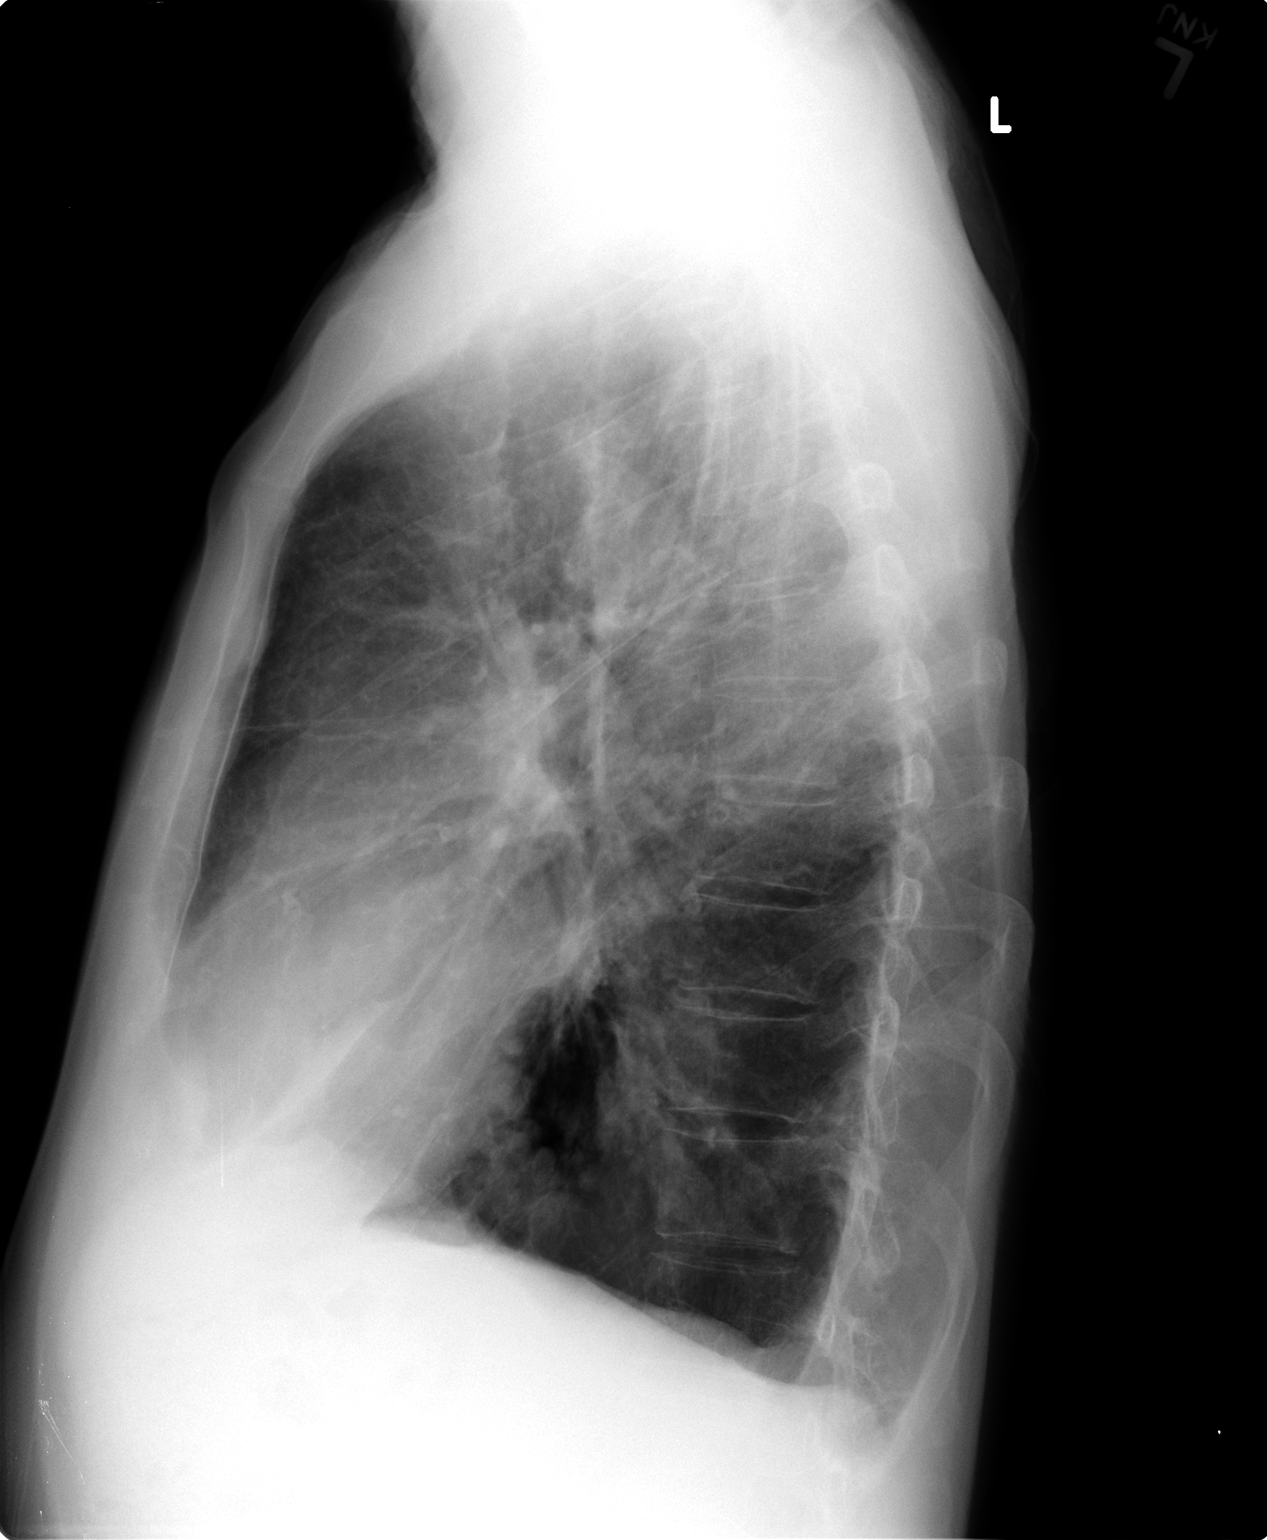

[2 of 2 positions shown; findings below may reference images not displayed]

FINDINGS: The trachea appears midline.

Heart size is normal.

Stable moderate hiatal hernia.

Reticular nodular opacities throughout the right lung are similar
to 10/05/2010.

There are increased lung volumes which appear unchanged from
previous exam.  Stable vague left upper lobe nodules.

No evidence for acute superimposed pulmonary abnormality.  Review
of the visualized osseous structures is unremarkable.
IMPRESSION: 1.  Chronic changes, primarily affecting the right lung.
2.  Hiatal hernia.

## 2011-07-07 ENCOUNTER — Encounter: Payer: Self-pay | Admitting: Internal Medicine

## 2011-07-07 ENCOUNTER — Encounter: Payer: Self-pay | Admitting: Gastroenterology

## 2011-07-07 ENCOUNTER — Ambulatory Visit (INDEPENDENT_AMBULATORY_CARE_PROVIDER_SITE_OTHER): Payer: Medicare Other | Admitting: Gastroenterology

## 2011-07-07 VITALS — BP 130/70 | HR 72 | Ht 67.0 in | Wt 135.4 lb

## 2011-07-07 DIAGNOSIS — K219 Gastro-esophageal reflux disease without esophagitis: Secondary | ICD-10-CM

## 2011-07-07 DIAGNOSIS — Z1211 Encounter for screening for malignant neoplasm of colon: Secondary | ICD-10-CM

## 2011-07-07 DIAGNOSIS — K5900 Constipation, unspecified: Secondary | ICD-10-CM

## 2011-07-07 MED ORDER — ESOMEPRAZOLE MAGNESIUM 40 MG PO CPDR: 40.0000 mg | DELAYED_RELEASE_CAPSULE | Freq: Two times a day (BID) | ORAL | Status: DC

## 2011-07-07 MED ORDER — PEG-KCL-NACL-NASULF-NA ASC-C 100 G PO SOLR: 1.0000 | Freq: Once | ORAL | Status: DC

## 2011-07-07 NOTE — Patient Instructions (Signed)
You have been scheduled for a Colonoscopy with propofol. See separate instructions.  Pick up your prep kit from your pharmacy.  Your prescription for Nexium has been sent to your pharmacy.  Patient advised to avoid spicy, acidic, citrus, chocolate, mints, fruit and fruit juices.  Limit the intake of caffeine, alcohol and Soda.  Don't exercise too soon after eating.  Don't lie down within 3-4 hours of eating.  Elevate the head of your bed. Constipation brochure given.

## 2011-07-07 NOTE — Progress Notes (Signed)
History of Present Illness: This is a 75 year old male with a history of GERD and recurrent esophageal strictures. He underwent upper endoscopy with esophageal dilation in April 2012. He is here today with his wife complaining of postprandial epigastric discomfort and bloating mainly occurring after breakfast. He occasionally has regurgitation and heartburn symptoms. In addition he has had difficulties with constipation, gas and bloating for over 1 year and uses Ducolax and Gas-X on a regular basis. He notes intermittent lower abdominal discomfort that is frequently relieved by bowel movements. He states he colonoscopy performed by Dr. Loreta Ave in 2002. I cannot locate the records at this time. Denies weight loss, diarrhea, change in stool caliber, melena, hematochezia, nausea, vomiting, dysphagia, reflux symptoms, chest pain.  Current Medications, Allergies, Past Medical History, Past Surgical History, Family History and Social History were reviewed in [employer] record.  Physical Exam: General: Well developed , well nourished, no acute distress Head: Normocephalic and atraumatic Eyes:  sclerae anicteric, EOMI Ears: Normal auditory acuity Mouth: No deformity or lesions Lungs: A few scattered inspiratory and expiratory rhonchi Heart: Regular rate and rhythm; no murmurs, rubs or bruits Abdomen: Soft, non tender and non distended. No masses, hepatosplenomegaly or hernias noted. Normal Bowel sounds Rectal: Deferred colonoscopy Musculoskeletal: Symmetrical with no gross deformities  Pulses:  Normal pulses noted Extremities: No clubbing, cyanosis, edema or deformities noted Neurological: Alert oriented x 4, grossly nonfocal Psychological:  Alert and cooperative. Normal mood and affect  Assessment and Recommendations:  1. GERD with a history of recurrent esophageal strictures. He is having morning breakthrough symptoms. Increase Nexium to 40 mg twice daily taken before breakfast  and before dinner.  2. Chronic constipation associated with lower abdominal discomfort. Increase dietary fiber and water intake. Continue Ducolax.   3. Colorectal cancer screening. Average risk. Schedule colonoscopy with propofol due to lung disease.  4. Bronchiectasis.  5. Wegener's granulomatosis.

## 2011-07-08 ENCOUNTER — Encounter: Payer: Self-pay | Admitting: Internal Medicine

## 2011-07-08 ENCOUNTER — Ambulatory Visit (INDEPENDENT_AMBULATORY_CARE_PROVIDER_SITE_OTHER): Payer: Medicare Other | Admitting: Internal Medicine

## 2011-07-08 ENCOUNTER — Ambulatory Visit (INDEPENDENT_AMBULATORY_CARE_PROVIDER_SITE_OTHER)
Admission: RE | Admit: 2011-07-08 | Discharge: 2011-07-08 | Disposition: A | Discharge status: Home / Self Care | Payer: Medicare Other | Source: Ambulatory Visit | Attending: Internal Medicine | Admitting: Internal Medicine

## 2011-07-08 VITALS — BP 112/78 | HR 69 | Temp 98.3°F | Ht 67.0 in | Wt 134.6 lb

## 2011-07-08 DIAGNOSIS — J479 Bronchiectasis, uncomplicated: Secondary | ICD-10-CM

## 2011-07-08 DIAGNOSIS — M3130 Wegener's granulomatosis without renal involvement: Secondary | ICD-10-CM

## 2011-07-08 MED ORDER — PREDNISONE 5 MG PO TABS: 2.5000 mg | ORAL_TABLET | Freq: Every day | ORAL | Status: DC

## 2011-07-08 NOTE — Progress Notes (Signed)
Subjective:    Patient ID: Richard [name], male    DOB: Aug 10, 1935, 75 y.o.   MRN: [id number]  HPI 21  yowm never smoker with dx of Longstanding bronchiectasis then dx with WG 03/2010 > requested establish with [name].   July 08, 2009- Bronchiectasis, chronic bronchits, allergic rhinitis  Had flu vax and has had at least 2 pneumovax.   Admit [facility] dx WG by pos c anca 03/2010, supportive tbbx but not vats   Admit [name] dx spont L Ptx 9/08/15/09 requiring Chest tube but no surgery   05/24/10--NP ov/ med reivew. Since last visit. has been hospitalized 05/12/2010-- 10/10/2011for Right pneumothorax., with underlying Wegener granulomatosis. and . Bronchiectasis. Pt was found to have Right Pneumo. Chest tube was placed. with improvement and removed 10/7. Xray on 10/10 w/ resolved pnuemo. He was continued on cytoxan and bactrim for PCP prophylaxis. rec. Prednisone 20 mg reduce to one half daily  Aspirin 81 mg one daily with breakfast but stop for any bleeding  See Patient Care Coordinator before leaving for cardiology appt > saw Dr [name], ? LHC needed   July 21, 2010 ov sob better no longer needing any 02 at all during day, no hemoptysis , producing occ yellow mucus - reports intermittent noct bilateral shoulder pain better with streching, moving, massage. no assoc nausea or sweats. recd try pred 5 mg daily   August 24, 2010 ov cough w/ yellow phlem, breathing is better, using flutter and med calendar consistently. R chest pain with deep breathing no change since R chest tube removed in 05/2010.   October 13, 2010 --Presents for an acute office visit. Complains of productive cough with yellow mucus, sore throat, rattling in chest, low grade temp x3days. Currently on pred 1/2 every other day . OTC not helping. rec continue qod pred, rx with augmentin x 10 days   11/16/2010 ov all smiles, no change on days of prednisone, minimal discolored sputum.   Try off prednisone completely.  If you feel a lot worse  over the next  week(worse breathing/ coughing/ nausea or loss of appetite) resume prednisone 5 mg one half daily until better then return back one half every other day  Late add:  Needs B12 and RBC Folate next ov  12/16/2010 ov/[name] cc no better on 5mg  dosed one half every other day (not the dose rec), still nauseated, still discolored p doxy complete May 7, sob in shower but not as much walking and no longer using 02, struggling with contingency plans. rec Work on inhaler technique:  If short of breath go ahead and use the xopenex hfa 2 puffs every 4 hours  Prednisone 5 mg 4 daily until better, then 2 daily x 5 days, then 1 daily thereafter   12/31/10 ov/[name], exac of bronchiectasis/wheeze no hemoptysis rx with pred burst and levaquin 750 x 5d  01/13/2011 ov/[name]  Cc sob and cough better now tapered prednisone to 5 mg each am using xop once daily around noon no problem with sleep or am exac of cough.   rec  02/11/2011 [name] cc recurrent hemoptysis on top of yellow that usually comes up esp in am x one year but no epistaxis, no cp or arthralgias or increased doe.  rec Levaquin 750 mg x 5 days courses whenever mucus turns bloody, more dark or with high fever.  Stop cytoxan and sulfasoxazole   Don't take aspirin if any active bleeding   03/16/2011 f/u ov/[name] cc no more blood,  Minimal yellow  mucus, back on asa 81 without hemoptysis. No sob rec  Try prednisone 5 mg  One on even and a half on odd  04/13/2011 ov/ [name] cc no cough, sob, rash, arthritis. No perceived change between the days of one vs a half rec          Acute Visit 04/26/11 -[name]  Cc  increased chest congestion, yellow sputum beginning about 1 week ago. He took a levaquin x 5 days (9/11 - 9/15). Has a sore throat, getting better. Nasal drainage and sinus drainage worse over last 3 -4 days. He tells me that he cut grass last Thursday, may have started this. No sick contacts.  rec Please start levaquin daily for another 5  days Continue your current prednisone dosing  Start loratadine 10mg  daily until your next visit    05/27/2011 f/u ov/[name] on 2.5 mg pred /day  cc still coughing, congested though much less purulent sputum p levaquin and no hemoptysis.  Does note fatigue > baseline  With doe worse x garbage cans to street and back and has not tried the xopenex at all. rec Try using xopenex as needed for short of breath  Only take the loratidine (clariton) if needed for itching sneezing runny nose     07/08/2011 f/u ov/[name] cc back to baseline of  more freq exac of purulent bronchitis but no hemoptysis  6 week follow-up. Patient states he is "about the same" as last visit. c/o sob, cough with yellow tinted mucus, and wheezing. Denies chest pain and chest tightness.    Pt denies any significant sore throat, dysphagia, itching, sneezing,  nasal congestion or excess/ purulent secretions,  fever, chills, sweats, unintended wt loss, pleuritic or exertional cp, hempoptysis, orthopnea pnd or leg swelling.  Also denies presyncope, palpitations, heartburn, abdominal pain, nausea, vomiting, diarrhea  or change in bowel or urinary habits, dysuria,hematuria,  rash, arthralgias, visual complaints, headache, numbness weakness or ataxia.  Also denies any obvious fluctuation of symptoms with weather or environmental changes or other aggravating or alleviating factors.    PMHx:  Allergic rhinitis  GERD- hiatal hernia with reflux  - Repeat Barium Swallow c/w stricture and reflux 03/29/10  Bronchiectasis  - See CT chest 12/14/2009 > progressive bilateral R> L cavities 03/26/10 > clinical dx Wegener's 04/07/10  - PFTs 02/17/10 FEV1 1.52 (58%) ratio 42 and 15% better after B2, DLC0 118%  WEGENER'S GRANULOMATOSIS  - dX BY pos C anca/ supportive TBBX 04/10/10 > CYTOXAN initiated  > trial off 02/12/2011  - Prednisone tapered off March 2012 > resumed 12/16/2010 daily due to nausea and worse cough PAF/ abn echo during [facility] admit 03/30/10   - ECH0 Aug 23 2011LVH with low nl ef, mild mr, ok R function, peak trop .59  - Not anticoagulated due to WG with Hemoptysis  L PTX 04/25/10  R PTX 05/12/10 > final f/u by [name] 06/01/10  benign hypertrophy of the prostate  Complex med regimen--Meds reviewed with pt education and computerized med calendar May 25, 2010   Family History:  Mother- deceased age 102;car wreck  Father- deceased age 2; breathing problems;asthma  Brother- living age 36; heart CAD (s/p CABG)  Sibling- deceased age 16; heart (s/p CABG)   Social History:  Retired  Never smoker  Married with 3 children      Objective:  Physical Exam  wt   128  04/26/11  >  130   05/27/2011 > 07/08/2011  134 amb thin pleasant wm nad  SKIN: no  rash, lesions  NODES: no lymphadenopathy  HEENT: [place]/AT, EOM- WNL, Conjuctivae- clear, PERRLA, TM-WNL, Nose- clear, Throat- clear and wnl, Mallampati III  NECK: Supple w/ fair ROM, JVD- none, normal carotid impulses w/o bruits Thyroid-  CHEST: no wheeze, few bilateral rhonchi insp  And exp HEART: RRR, no m/g/r heard  ABDOMEN: Soft and nl   CXR  07/08/2011 :  No significant change in appearance of chronic findings as discussed above.    Assessment & Plan:

## 2011-07-08 NOTE — Assessment & Plan Note (Signed)
No evidence of dz activity, reviewed

## 2011-07-08 NOTE — Patient Instructions (Signed)
No change in your medication regimen for now  Please remember to go to the x-ray department downstairs for your tests - we will call you with the results when they are available.  Please schedule a follow up office visit in 6 weeks, call sooner if needed

## 2011-07-08 NOTE — Assessment & Plan Note (Signed)
Moderate/ severe airflow obstruction with minimal further reversible dz   Adequate control on present rx, reviewed

## 2011-07-13 NOTE — Progress Notes (Signed)
Quick Note:  Spoke with pt and notified of results per Dr. [name]. Pt verbalized understanding and denied any questions.  ______ 

## 2011-07-15 ENCOUNTER — Emergency Department (HOSPITAL_COMMUNITY): Payer: Medicare Other

## 2011-07-15 ENCOUNTER — Encounter (HOSPITAL_COMMUNITY): Payer: Self-pay | Admitting: *Deleted

## 2011-07-15 ENCOUNTER — Telehealth: Payer: Self-pay | Admitting: Internal Medicine

## 2011-07-15 ENCOUNTER — Emergency Department (HOSPITAL_COMMUNITY)
Admission: EM | Admit: 2011-07-15 | Discharge: 2011-07-16 | Disposition: A | Discharge status: Home / Self Care | Payer: Medicare Other | Attending: Emergency Medicine | Admitting: Emergency Medicine

## 2011-07-15 DIAGNOSIS — J40 Bronchitis, not specified as acute or chronic: Secondary | ICD-10-CM | POA: Insufficient documentation

## 2011-07-15 DIAGNOSIS — R05 Cough: Secondary | ICD-10-CM

## 2011-07-15 DIAGNOSIS — J449 Chronic obstructive pulmonary disease, unspecified: Secondary | ICD-10-CM | POA: Insufficient documentation

## 2011-07-15 DIAGNOSIS — M79609 Pain in unspecified limb: Secondary | ICD-10-CM | POA: Insufficient documentation

## 2011-07-15 DIAGNOSIS — J4489 Other specified chronic obstructive pulmonary disease: Secondary | ICD-10-CM | POA: Insufficient documentation

## 2011-07-15 DIAGNOSIS — R059 Cough, unspecified: Secondary | ICD-10-CM | POA: Insufficient documentation

## 2011-07-15 DIAGNOSIS — R509 Fever, unspecified: Secondary | ICD-10-CM | POA: Insufficient documentation

## 2011-07-15 DIAGNOSIS — R5381 Other malaise: Secondary | ICD-10-CM | POA: Insufficient documentation

## 2011-07-15 LAB — CBC
MCH: 32.5 pg (ref 26.0–34.0)
MCHC: 33.9 g/dL (ref 30.0–36.0)
MCV: 95.9 fL (ref 78.0–100.0)
Platelets: 205 10*3/uL (ref 150–400)
RBC: 3.88 MIL/uL — ABNORMAL LOW (ref 4.22–5.81)
RDW: 13.6 % (ref 11.5–15.5)

## 2011-07-15 LAB — DIFFERENTIAL
Basophils Relative: 0 % (ref 0–1)
Eosinophils Absolute: 0 10*3/uL (ref 0.0–0.7)
Eosinophils Relative: 0 % (ref 0–5)
Lymphs Abs: 0.7 10*3/uL (ref 0.7–4.0)

## 2011-07-15 LAB — URINE MICROSCOPIC-ADD ON

## 2011-07-15 LAB — URINALYSIS, ROUTINE W REFLEX MICROSCOPIC
Glucose, UA: NEGATIVE mg/dL
Ketones, ur: 15 mg/dL — AB
Protein, ur: NEGATIVE mg/dL

## 2011-07-15 LAB — BASIC METABOLIC PANEL
Calcium: 9.3 mg/dL (ref 8.4–10.5)
GFR calc Af Amer: 54 mL/min — ABNORMAL LOW (ref >90)
GFR calc non Af Amer: 47 mL/min — ABNORMAL LOW (ref >90)
Glucose, Bld: 119 mg/dL — ABNORMAL HIGH (ref 70–99)
Sodium: 136 mEq/L (ref 135–145)

## 2011-07-15 MED ORDER — OSELTAMIVIR PHOSPHATE 75 MG PO CAPS: 75.0000 mg | ORAL_CAPSULE | Freq: Two times a day (BID) | ORAL | Status: AC

## 2011-07-15 MED ORDER — SODIUM CHLORIDE 0.9 % IV BOLUS (SEPSIS)
500.0000 mL | Freq: Once | INTRAVENOUS | Status: AC
  Administered 2011-07-15: 500 mL via INTRAVENOUS

## 2011-07-15 MED ORDER — MORPHINE SULFATE 2 MG/ML IJ SOLN
2.0000 mg | Freq: Once | INTRAMUSCULAR | Status: AC
  Administered 2011-07-15: 2 mg via INTRAVENOUS
  Filled 2011-07-15: qty 1

## 2011-07-15 NOTE — ED Provider Notes (Signed)
History     CSN: [id number] Arrival date & time: 07/15/2011  6:25 PM   First MD Initiated Contact with Patient 07/15/11 1944      Chief Complaint  Patient presents with  . Cough    pt reports hx of bronchitis and states "I think its building up in my chest." pt reports fever, and vomiting as well.     HPI  75yoM history of bronchiectasis, Wegener's, history of aspergillosis of the lungs presents with cough and fever. The patient states that today he began to experience subjective fevers. He didn't measure his temperature today 104. His fever has been responsive to Tylenol. He took Tylenol prior to arrival in the emergency department. He states that his chronic cough is worse than usual it has been productive with yellow phlegm. +Nausea and one episode NBNB emesis. He denies hemoptysis. He states that he has a long-standing Levaquin prescription given to him by pulmonary for the symptoms began. He took one dose of Levaquin this morning. He denies wheezing. They have the capability to measure his oxygenation at home and he has not desaturated. He states that his shortness of breath is chronic and unchanged from previous. He denies abdominal pain, nausea, vomiting. He is tolerating by mouth without difficulty. Denies hematuria/dysuria/freq/urgency. He does complain of bilateral lower extremity aching and mild back aching. No history of sick contacts. He does take prednisone daily. He did have his flu shot this year.    Past Medical History  Diagnosis Date  . Allergy   . GERD (gastroesophageal reflux disease)   . Arthritis   . Asthma   . Fungal infection     lungs  . Cataract   . Ulcer   . BPH (benign prostatic hyperplasia)   . Wegener's granulomatosis   . Peptic stricture of esophagus   . Anemia   . Hiatal hernia     Past Surgical History  Procedure Date  . Hernia repair   . Eye surgery     cataracts removed.     Family History  Problem Relation Age of Onset  . Asthma Father    . Coronary artery disease Brother     History  Substance Use Topics  . Smoking status: Never Smoker   . Smokeless tobacco: Never Used  . Alcohol Use: No    Review of Systems  All other systems reviewed and are negative.  except as noted HPI   Allergies  Review of patient's allergies indicates no known allergies.  Home Medications   Current Outpatient Rx  Name Route Sig Dispense Refill  . ASPIRIN 81 MG PO TABS Oral Take 81 mg by mouth daily.      . B COMPLEX PO TABS Oral Take 1 tablet by mouth daily.      Marland Kitchen BISACODYL 5 MG PO TBEC Oral Take 5 mg by mouth daily as needed. CONSTIPATION    . BISOPROLOL FUMARATE 5 MG PO TABS Oral Take 5 mg by mouth daily.     . BUDESONIDE-FORMOTEROL FUMARATE 160-4.5 MCG/ACT IN AERO Inhalation Inhale 2 puffs into the lungs 2 (two) times daily. 1 Inhaler 6  . DM-GUAIFENESIN ER 30-600 MG PO TB12 Oral Take 2 tablets by mouth 2 (two) times daily.      . DUTASTERIDE 0.5 MG PO CAPS Oral Take 0.5 mg by mouth every other day.      . ESOMEPRAZOLE MAGNESIUM 40 MG PO CPDR Oral Take 1 capsule (40 mg total) by mouth 2 (two) times daily. 60  capsule 11  . LEVALBUTEROL TARTRATE 45 MCG/ACT IN AERO  2 puffs every 4 hours as needed 1 Inhaler 2  . LORATADINE 10 MG PO TABS Oral Take 1 tablet (10 mg total) by mouth daily. 30 tablet 3  . PREDNISONE 5 MG PO TABS Oral Take 0.5 tablets (2.5 mg total) by mouth daily. 100 tablet 0  . TAMSULOSIN HCL 0.4 MG PO CAPS Oral Take 0.4 mg by mouth daily.      . TRAMADOL HCL 50 MG PO TABS Oral Take 50 mg by mouth every 6 (six) hours as needed. PAIN    . OSELTAMIVIR PHOSPHATE 75 MG PO CAPS Oral Take 1 capsule (75 mg total) by mouth every 12 (twelve) hours. 10 capsule 0    BP 125/68  Pulse 86  Temp(Src) 100.1 F (37.8 C) (Oral)  Resp 16  Wt 130 lb (58.968 kg)  SpO2 97%  Physical Exam  Nursing note and vitals reviewed. Constitutional: He is oriented to person, place, and time. He appears well-developed and well-nourished. No  distress.  HENT:  Head: Atraumatic.  Mouth/Throat: Oropharynx is clear and moist.  Eyes: Conjunctivae are normal. Pupils are equal, round, and reactive to light.  Neck: Neck supple.  Cardiovascular: Normal rate, regular rhythm, normal heart sounds and intact distal pulses.  Exam reveals no gallop and no friction rub.   No murmur heard. Pulmonary/Chest: Effort normal. No respiratory distress. He has no wheezes. He has no rales.       No tachypnea  Abdominal: Soft. Bowel sounds are normal. There is no tenderness. There is no rebound and no guarding.  Musculoskeletal: Normal range of motion. He exhibits no edema and no tenderness.  Neurological: He is alert and oriented to person, place, and time.  Skin: Skin is warm and dry.  Psychiatric: He has a normal mood and affect.    ED Course  Procedures (including critical care time)  Labs Reviewed  CBC - Abnormal; Notable for the following:    WBC 11.9 (*)    RBC 3.88 (*)    Hemoglobin 12.6 (*)    HCT 37.2 (*)    All other components within normal limits  DIFFERENTIAL - Abnormal; Notable for the following:    Neutrophils Relative 84 (*)    Neutro Abs 10.0 (*)    Lymphocytes Relative 6 (*)    Monocytes Absolute 1.2 (*)    All other components within normal limits  BASIC METABOLIC PANEL - Abnormal; Notable for the following:    Glucose, Bld 119 (*)    BUN 26 (*)    Creatinine, Ser 1.44 (*)    GFR calc non Af Amer 47 (*)    GFR calc Af Amer 54 (*)    All other components within normal limits  URINALYSIS, ROUTINE W REFLEX MICROSCOPIC - Abnormal; Notable for the following:    Hgb urine dipstick TRACE (*)    Ketones, ur 15 (*)    All other components within normal limits  RAPID STREP SCREEN  URINE MICROSCOPIC-ADD ON  INFLUENZA PANEL BY PCR  STREP A DNA PROBE   Dg Chest 2 View  07/15/2011  *RADIOLOGY REPORT*  Clinical Data: Cough, weakness, fever, lower extremity pain.  COPD. Sick for 2 days.  CHEST - 2 VIEW  Comparison: 07/08/2011   Findings: Normal heart size and pulmonary vascularity. Emphysematous changes in the lungs with scattered fibrosis. Central bronchiectasis with peribronchial thickening suggesting chronic bronchitis.  Nodular changes in the left lung base consistent with bronchiectasis and  mucus plugging.  No focal airspace consolidation.  No blunting of costophrenic angles.  No pneumothorax.  Calcification of the aorta.  Esophageal hiatal hernia behind the heart.  Stable appearance of the chest since previous study.  IMPRESSION: No evidence of active pulmonary disease.  Stable chronic changes of emphysema, chronic bronchitis, bronchiectasis, mucus plugging, and fibrosis.  Esophageal hiatal hernia behind the heart.  Original Report Authenticated By: Marlon Pel, M.D.     1. Bronchitis   2. Cough     MDM  Chronic lung disease with worsening cough, fever. Pt appears well here, VSS. CXR negative acute infiltrate. D/W MD covering Pulm critical care. Pt to continue xopenex, levaquin. Will follow in office next week for recheck. Precautions for return. Will cover the pt with tamiflu given leg aches although unlikely. D/W patient and family. Comfortable with plan for f/u. They can monitor his o2 sats at home as well. They have refills of xopenex. Will continue levaquin.        Forbes Cellar, MD 07/15/11 2352

## 2011-07-15 NOTE — Telephone Encounter (Signed)
I spoke with pt spouse and she states pt has been vomiting since last night and has been running a fever of 104 and has been as high as 105. Pt can't keep anything down. I advised pt spouse that she needed to take pt to the ER to be evaluated. She voiced her understanding and states she will take him right away. Will forward to Dr. [name] so he is aware.

## 2011-07-15 NOTE — Discharge Instructions (Signed)
Return to ER immediately for worsening cough, blood in phlegm, shortness of breath, poor oxygenation, or if you are concerned. Take your levaquin and xopenex as prescribed.   Bronchitis Bronchitis is the body's way of reacting to injury and/or infection (inflammation) of the bronchi. Bronchi are the air tubes that extend from the windpipe into the lungs. If the inflammation becomes severe, it may cause shortness of breath. CAUSES  Inflammation may be caused by:  A virus.   Germs (bacteria).   Dust.   Allergens.   Pollutants and many other irritants.  The cells lining the bronchial tree are covered with tiny hairs (cilia). These constantly beat upward, away from the lungs, toward the mouth. This keeps the lungs free of pollutants. When these cells become too irritated and are unable to do their job, mucus begins to develop. This causes the characteristic cough of bronchitis. The cough clears the lungs when the cilia are unable to do their job. Without either of these protective mechanisms, the mucus would settle in the lungs. Then you would develop pneumonia. Smoking is a common cause of bronchitis and can contribute to pneumonia. Stopping this habit is the single most important thing you can do to help yourself. TREATMENT   Your caregiver may prescribe an antibiotic if the cough is caused by bacteria. Also, medicines that open up your airways make it easier to breathe. Your caregiver may also recommend or prescribe an expectorant. It will loosen the mucus to be coughed up. Only take over-the-counter or prescription medicines for pain, discomfort, or fever as directed by your caregiver.   Removing whatever causes the problem (smoking, for example) is critical to preventing the problem from getting worse.   Cough suppressants may be prescribed for relief of cough symptoms.   Inhaled medicines may be prescribed to help with symptoms now and to help prevent problems from returning.   For  those with recurrent (chronic) bronchitis, there may be a need for steroid medicines.  SEEK IMMEDIATE MEDICAL CARE IF:   During treatment, you develop more pus-like mucus (purulent sputum).   You have a fever.   Your baby is older than 3 months with a rectal temperature of 102 F (38.9 C) or higher.   Your baby is 43 months old or younger with a rectal temperature of 100.4 F (38 C) or higher.   You become progressively more ill.   You have increased difficulty breathing, wheezing, or shortness of breath.  It is necessary to seek immediate medical care if you are elderly or sick from any other disease. MAKE SURE YOU:   Understand these instructions.   Will watch your condition.   Will get help right away if you are not doing well or get worse.  Document Released: 07/25/2005 Document Revised: 04/06/2011 Document Reviewed: 06/03/2008 [facility] Patient Information 2012 [employer], [place].

## 2011-07-16 ENCOUNTER — Telehealth: Payer: Self-pay | Admitting: Critical Care Medicine

## 2011-07-16 NOTE — Telephone Encounter (Signed)
Call from Dr. Hyman Hopes in ED 12/7 regarding [name].  Fever at home to 104 with cough.  Took Levaquin already prescribed by Dr. [name] for such episodes.  In ED patient with temp of 100.1 and cough but otherwise HD stable with clear CXR.  Labs unremarkable. Plan: 1. Patient to continue ABX and other medications including baseline steroids. 2. Call office on Monday for follow up 3. To return to ED if symptoms worsen

## 2011-07-17 LAB — STREP A DNA PROBE: Group A Strep Probe: NEGATIVE

## 2011-07-18 NOTE — Telephone Encounter (Signed)
Spoke with patient earlier today from forwarded message from Dr Deterding.  Pt is still running temp up to 100, no n/v/d.  Has appt with TP 12.11.12 @ 3:30.  Pt to keep this appt and seek emergency help if symptoms worsen overnight.

## 2011-07-18 NOTE — Telephone Encounter (Signed)
Set up ov as recommended

## 2011-07-18 NOTE — Telephone Encounter (Signed)
Pt's wife stated pt went to ED on Friday & was told at the ED to schedule a follow up w/ MW in 3 days.  Scheduled an appt w/ TP for 12/11.  Pt's wife stated that pt is still running a temp.  Is this ok or should pt be seen in UC or ED again? Antionette Fairy

## 2011-07-19 ENCOUNTER — Ambulatory Visit (INDEPENDENT_AMBULATORY_CARE_PROVIDER_SITE_OTHER): Payer: Medicare Other | Admitting: Adult Health

## 2011-07-19 ENCOUNTER — Encounter: Payer: Self-pay | Admitting: Adult Health

## 2011-07-19 VITALS — BP 118/72 | HR 80 | Temp 98.9°F | Ht 67.0 in | Wt 128.2 lb

## 2011-07-19 DIAGNOSIS — J479 Bronchiectasis, uncomplicated: Secondary | ICD-10-CM

## 2011-07-19 MED ORDER — LEVOFLOXACIN 750 MG PO TABS: 750.0000 mg | ORAL_TABLET | Freq: Every day | ORAL | Status: AC

## 2011-07-19 NOTE — Patient Instructions (Signed)
Extend Levaquin daily for 5 days  Mucinex DM Twice daily  As needed  Cough/congestion  Fluids and rest  Tylenol As needed  Fever  Please contact office for sooner follow up if symptoms do not improve or worsen or seek emergency care  follow up Dr. [name]  In 4 weeks and As needed

## 2011-07-19 NOTE — Assessment & Plan Note (Addendum)
Recent flare slowly resolving   Plan ; Extend Levaquin daily for 5 days  Mucinex DM Twice daily  As needed  Cough/congestion  Fluids and rest  Tylenol As needed  Fever  Please contact office for sooner follow up if symptoms do not improve or worsen or seek emergency care  follow up Dr. [name]  In 4 weeks and As needed

## 2011-07-19 NOTE — Progress Notes (Signed)
Subjective:    Patient ID: Richard [name], male    DOB: [birth year], 75 y.o.   MRN: [id number]  HPI 19  yowm never smoker with dx of Longstanding bronchiectasis then dx with WG 03/2010 > requested establish with [name].   July 08, 2009- Bronchiectasis, chronic bronchits, allergic rhinitis  Had flu vax and has had at least 2 pneumovax.   Admit [facility] dx WG by pos c anca 03/2010, supportive tbbx but not vats   Admit [name] dx spont L Ptx 9/08/15/09 requiring Chest tube but no surgery   05/24/10--NP ov/ med reivew. Since last visit. has been hospitalized 05/12/2010-- 10/10/2011for Right pneumothorax., with underlying Wegener granulomatosis. and . Bronchiectasis. Pt was found to have Right Pneumo. Chest tube was placed. with improvement and removed 10/7. Xray on 10/10 w/ resolved pnuemo. He was continued on cytoxan and bactrim for PCP prophylaxis. rec. Prednisone 20 mg reduce to one half daily  Aspirin 81 mg one daily with breakfast but stop for any bleeding  See Patient Care Coordinator before leaving for cardiology appt > saw Dr [name], ? LHC needed   July 21, 2010 ov sob better no longer needing any 02 at all during day, no hemoptysis , producing occ yellow mucus - reports intermittent noct bilateral shoulder pain better with streching, moving, massage. no assoc nausea or sweats. recd try pred 5 mg daily   August 24, 2010 ov cough w/ yellow phlem, breathing is better, using flutter and med calendar consistently. R chest pain with deep breathing no change since R chest tube removed in 05/2010.   October 13, 2010 --Presents for an acute office visit. Complains of productive cough with yellow mucus, sore throat, rattling in chest, low grade temp x3days. Currently on pred 1/2 every other day . OTC not helping. rec continue qod pred, rx with augmentin x 10 days   11/16/2010 ov all smiles, no change on days of prednisone, minimal discolored sputum.   Try off prednisone completely.  If you feel a lot worse  over the next  week(worse breathing/ coughing/ nausea or loss of appetite) resume prednisone 5 mg one half daily until better then return back one half every other day  Late add:  Needs B12 and RBC Folate next ov  12/16/2010 ov/[name] cc no better on 5mg  dosed one half every other day (not the dose rec), still nauseated, still discolored p doxy complete May 7, sob in shower but not as much walking and no longer using 02, struggling with contingency plans. rec Work on inhaler technique:  If short of breath go ahead and use the xopenex hfa 2 puffs every 4 hours  Prednisone 5 mg 4 daily until better, then 2 daily x 5 days, then 1 daily thereafter   12/31/10 ov/[name], exac of bronchiectasis/wheeze no hemoptysis rx with pred burst and levaquin 750 x 5d  01/13/2011 ov/[name]  Cc sob and cough better now tapered prednisone to 5 mg each am using xop once daily around noon no problem with sleep or am exac of cough.   rec  02/11/2011 [name] cc recurrent hemoptysis on top of yellow that usually comes up esp in am x one year but no epistaxis, no cp or arthralgias or increased doe.  rec Levaquin 750 mg x 5 days courses whenever mucus turns bloody, more dark or with high fever.  Stop cytoxan and sulfasoxazole   Don't take aspirin if any active bleeding   03/16/2011 f/u ov/[name] cc no more blood,  Minimal yellow  mucus, back on asa 81 without hemoptysis. No sob rec  Try prednisone 5 mg  One on even and a half on odd  04/13/2011 ov/ [name] cc no cough, sob, rash, arthritis. No perceived change between the days of one vs a half rec          Acute Visit 04/26/11 -[name]  Cc  increased chest congestion, yellow sputum beginning about 1 week ago. He took a levaquin x 5 days (9/11 - 9/15). Has a sore throat, getting better. Nasal drainage and sinus drainage worse over last 3 -4 days. He tells me that he cut grass last Thursday, may have started this. No sick contacts.  rec Please start levaquin daily for another 5  days Continue your current prednisone dosing  Start loratadine 10mg  daily until your next visit    05/27/2011 f/u ov/[name] on 2.5 mg pred /day  cc still coughing, congested though much less purulent sputum p levaquin and no hemoptysis.  Does note fatigue > baseline  With doe worse x garbage cans to street and back and has not tried the xopenex at all. rec Try using xopenex as needed for short of breath  Only take the loratidine (clariton) if needed for itching sneezing runny nose     07/08/2011 f/u ov/[name] cc back to baseline of  more freq exac of purulent bronchitis but no hemoptysis 6 week follow-up. Patient states he is "about the same" as last visit. c/o sob, cough with yellow tinted mucus, and wheezing. Denies chest pain and chest tightness.  >  07/19/2011 ER follow up  Pt returns for ER follow up . Seen on 07/15/11 with increased cough, congestion and fever. Dx with acute bronchiectasis and possible influenza. He has started on Levaquin 750mg  x 5 days w/ last day today. Also given Tamiflu but did not fill this. Fevers are decreasing. Cough and congesiton are improved but not gone. Still feels weak.CXR w/o acute changes.  Labs from ER reviewed with elevated WBC 11k, w/ left shift. Neg strep test. No influenza data found.       PMHx:  Allergic rhinitis  GERD- hiatal hernia with reflux  - Repeat Barium Swallow c/w stricture and reflux 03/29/10  Bronchiectasis  - See CT chest 12/14/2009 > progressive bilateral R> L cavities 03/26/10 > clinical dx Wegener's 04/07/10  - PFTs 02/17/10 FEV1 1.52 (58%) ratio 42 and 15% better after B2, DLC0 118%  WEGENER'S GRANULOMATOSIS  - dX BY pos C anca/ supportive TBBX 04/10/10 > CYTOXAN initiated  > trial off 02/12/2011  - Prednisone tapered off March 2012 > resumed 12/16/2010 daily due to nausea and worse cough PAF/ abn echo during [facility] admit 03/30/10  - ECH0 Aug 23 2011LVH with low nl ef, mild mr, ok R function, peak trop .59  - Not anticoagulated due to WG  with Hemoptysis  L PTX 04/25/10  R PTX 05/12/10 > final f/u by [name] 06/01/10  benign hypertrophy of the prostate  Complex med regimen--Meds reviewed with pt education and computerized med calendar May 25, 2010   Family History:  Mother- deceased age 55;car wreck  Father- deceased age 29; breathing problems;asthma  Brother- living age 40; heart CAD (s/p CABG)  Sibling- deceased age 32; heart (s/p CABG)   Social History:  Retired  Never smoker  Married with 3 children    ROS:    Constitutional:   No  weight loss, night sweats,   +Fevers, chills, fatigue, or  lassitude.  HEENT:   No headaches,  Difficulty swallowing,  Tooth/dental problems, or  Sore throat,                No sneezing, itching, ear ache, nasal congestion, post nasal drip,   CV:  No chest pain,  Orthopnea, PND, swelling in lower extremities, anasarca, dizziness, palpitations, syncope.   GI  No heartburn, indigestion, abdominal pain, nausea, vomiting, diarrhea, change in bowel habits, loss of appetite, bloody stools.   Resp:   No coughing up of blood.   Marland Kitchen  No chest wall deformity  Skin: no rash or lesions.  GU: no dysuria, change in color of urine, no urgency or frequency.  No flank pain, no hematuria   MS:  No joint pain or swelling.  No decreased range of motion.     Psych:  No change in mood or affect. No depression or anxiety.  No memory loss.       Objective:  Physical Exam  wt   128  04/26/11  >  130   05/27/2011 > 07/08/2011  134>128 07/19/2011  amb thin pleasant wm nad  SKIN: no rash, lesions  NODES: no lymphadenopathy  HEENT: [place]/AT, EOM- WNL, Conjuctivae- clear, PERRLA, TM-WNL, Nose- clear, Throat- clear and wnl, Mallampati III  NECK: Supple w/ fair ROM, JVD- none, normal carotid impulses w/o bruits Thyroid-  CHEST: no wheeze, coarse BS  HEART: RRR, no m/g/r heard  ABDOMEN: Soft and nl  CXR : 07/15/11  No evidence of active pulmonary disease. Stable chronic changes of  emphysema,  chronic bronchitis, bronchiectasis, mucus plugging, and  fibrosis. Esophageal hiatal hernia behind the heart.      Assessment & Plan:

## 2011-07-26 ENCOUNTER — Other Ambulatory Visit: Payer: Self-pay | Admitting: Family Medicine

## 2011-08-04 ENCOUNTER — Other Ambulatory Visit: Payer: Self-pay | Admitting: Family Medicine

## 2011-08-04 NOTE — Telephone Encounter (Signed)
Can we refill this? 

## 2011-08-10 NOTE — Telephone Encounter (Signed)
Call in #[phone] with no rf. He needs an OV soon  

## 2011-08-14 ENCOUNTER — Other Ambulatory Visit: Payer: Self-pay | Admitting: Internal Medicine

## 2011-08-16 ENCOUNTER — Encounter: Payer: Self-pay | Admitting: Gastroenterology

## 2011-08-16 ENCOUNTER — Ambulatory Visit (AMBULATORY_SURGERY_CENTER): Payer: Medicare Other | Admitting: Gastroenterology

## 2011-08-16 DIAGNOSIS — D126 Benign neoplasm of colon, unspecified: Secondary | ICD-10-CM

## 2011-08-16 DIAGNOSIS — Z1211 Encounter for screening for malignant neoplasm of colon: Secondary | ICD-10-CM

## 2011-08-16 HISTORY — PX: COLONOSCOPY: SHX174

## 2011-08-16 MED ORDER — SODIUM CHLORIDE 0.9 % IV SOLN: 500.0000 mL | INTRAVENOUS | Status: DC

## 2011-08-16 NOTE — Patient Instructions (Signed)
Please refer to your blue and neon green sheets for instructions regarding diet and activity for the rest of today.  You may resume your medications as you would normally take them.   You will receive a letter in the mail in about two weeks regarding the results of the biopsies taken today.  Diverticulosis Diverticulosis is a common condition that develops when small pouches (diverticula) form in the wall of the colon. The risk of diverticulosis increases with age. It happens more often in people who eat a low-fiber diet. Most individuals with diverticulosis have no symptoms. Those individuals with symptoms usually experience abdominal pain, constipation, or loose stools (diarrhea). HOME CARE INSTRUCTIONS   Increase the amount of fiber in your diet as directed by your caregiver or dietician. This may reduce symptoms of diverticulosis.   Your caregiver may recommend taking a dietary fiber supplement.   Drink at least 6 to 8 glasses of water each day to prevent constipation.   Try not to strain when you have a bowel movement.   Your caregiver may recommend avoiding nuts and seeds to prevent complications, although this is still an uncertain benefit.   Only take over-the-counter or prescription medicines for pain, discomfort, or fever as directed by your caregiver.  FOODS WITH HIGH FIBER CONTENT INCLUDE:  Fruits. Apple, peach, pear, tangerine, raisins, prunes.   Vegetables. Brussels sprouts, asparagus, broccoli, cabbage, carrot, cauliflower, romaine lettuce, spinach, summer squash, tomato, winter squash, zucchini.   Starchy Vegetables. Baked beans, kidney beans, lima beans, split peas, lentils, potatoes (with skin).   Grains. Whole wheat bread, brown rice, bran flake cereal, plain oatmeal, white rice, shredded wheat, bran muffins.  SEEK IMMEDIATE MEDICAL CARE IF:   You develop increasing pain or severe bloating.   You have an oral temperature above 102 F (38.9 C), not controlled by  medicine.   You develop vomiting or bowel movements that are bloody or black.  Document Released: 04/21/2004 Document Revised: 04/06/2011 Document Reviewed: 12/23/2009 Exit[employer] Patient Information 2012 [employer].  Colon Polyps A polyp is extra tissue that grows inside your body. Colon polyps grow in the large intestine. The large intestine, also called the colon, is part of your digestive system. It is a long, hollow tube at the end of your digestive tract where your body makes and stores stool. Most polyps are not dangerous. They are benign. This means they are not cancerous. But over time, some types of polyps can turn into cancer. Polyps that are smaller than a pea are usually not harmful. But larger polyps could someday become or may already be cancerous. To be safe, doctors remove all polyps and test them.  WHO GETS POLYPS? Anyone can get polyps, but certain people are more likely than others. You may have a greater chance of getting polyps if:  You are over 50.   You have had polyps before.   Someone in your family has had polyps.   Someone in your family has had cancer of the large intestine.   Find out if someone in your family has had polyps. You may also be more likely to get polyps if you:   Eat a lot of fatty foods.   Smoke.   Drink alcohol.   Do not exercise.   Eat too much.  SYMPTOMS  Most small polyps do not cause symptoms. People often do not know they have one until their caregiver finds it during a regular checkup or while testing them for something else. Some people do   have symptoms like these:  Bleeding from the anus. You might notice blood on your underwear or on toilet paper after you have had a bowel movement.   Constipation or diarrhea that lasts more than a week.   Blood in the stool. Blood can make stool look black or it can show up as red streaks in the stool.  If you have any of these symptoms, see your caregiver. HOW DOES THE DOCTOR TEST FOR  POLYPS? The doctor can use four tests to check for polyps:  Digital rectal exam. The caregiver wears gloves and checks your rectum (the last part of the large intestine) to see if it feels normal. This test would find polyps only in the rectum. Your caregiver may need to do one of the other tests listed below to find polyps higher up in the intestine.   Barium enema. The caregiver puts a liquid called barium into your rectum before taking x-rays of your large intestine. Barium makes your intestine look white in the pictures. Polyps are dark, so they are easy to see.   Sigmoidoscopy. With this test, the caregiver can see inside your large intestine. A thin flexible tube is placed into your rectum. The device is called a sigmoidoscope, which has a light and a tiny video camera in it. The caregiver uses the sigmoidoscope to look at the last third of your large intestine.   Colonoscopy. This test is like sigmoidoscopy, but the caregiver looks at all of the large intestine. It usually requires sedation. This is the most common method for finding and removing polyps.  TREATMENT   The caregiver will remove the polyp during sigmoidoscopy or colonoscopy. The polyp is then tested for cancer.   If you have had polyps, your caregiver may want you to get tested regularly in the future.  PREVENTION  There is not one sure way to prevent polyps. You might be able to lower your risk of getting them if you:  Eat more fruits and vegetables and less fatty food.   Do not smoke.   Avoid alcohol.   Exercise every day.   Lose weight if you are overweight.   Eating more calcium and folate can also lower your risk of getting polyps. Some foods that are rich in calcium are milk, cheese, and broccoli. Some foods that are rich in folate are chickpeas, kidney beans, and spinach.   Aspirin might help prevent polyps. Studies are under way.  Document Released: 04/20/2004 Document Revised: 04/06/2011 Document Reviewed:  09/26/2007 Exit[employer] Patient Information 2012 [employer]. 

## 2011-08-16 NOTE — Op Note (Signed)
[facility] [address] [place], [place]  [address]  COLONOSCOPY PROCEDURE REPORT PATIENT:  Richard [name], Richard [name]  MR#:  [id number] BIRTHDATE:  07-02-1936, 75 yrs. old  GENDER:  male ENDOSCOPIST:  Judie Petit T. Russella Dar, MD, [facility]  PROCEDURE DATE:  08/16/2011 PROCEDURE:  Colonoscopy with snare polypectomy ASA CLASS:  Class III INDICATIONS:  1) Routine Risk Screening MEDICATIONS:   MAC sedation, administered by CRNA, propofol (Diprivan) 140 mg IV DESCRIPTION OF PROCEDURE:   After the risks benefits and alternatives of the procedure were thoroughly explained, informed consent was obtained.  Digital rectal exam was performed and revealed no abnormalities.   The LB [id number]-[id number] endoscope was introduced through the anus and advanced to the cecum, which was identified by both the appendix and ileocecal valve, without limitations.  The quality of the prep was good, using MoviPrep. The instrument was then slowly withdrawn as the colon was fully examined. <<PROCEDUREIMAGES>> FINDINGS:  A sessile polyp was found in the sigmoid colon. It was 5 mm in size. Polyp was snared without cautery. Retrieval was successful. Moderate diverticulosis was found in the sigmoid to descending colon. Otherwise normal colonoscopy without other polyps, masses, vascular ectasias, or inflammatory changes. Retroflexed views in the rectum revealed no abnormalities.  The time to cecum =  2.33  minutes. The scope was then withdrawn (time =  9.75  min) from the patient and the procedure completed.  COMPLICATIONS:  None  ENDOSCOPIC IMPRESSION: 1) 5 mm sessile polyp in the sigmoid colon 2) Moderate diverticulosis in the sigmoid to descending colon  RECOMMENDATIONS: 1) Await pathology results 2) High fiber diet with liberal fluid intake. 3) If the polyp is adenomatous (pre-cancerous) polyps, repeat colonoscopy in 5 years if health status appropriate. Otherwise no plans for future routine screening  colonoscopy as these exams usually stop around 74 years of age.  Venita Lick. Russella Dar, MD, [facility]  n. eSIGNED:   Venita Lick. Domanic Matusek at 08/16/2011 09:04 AM  Carolin Sicks, [phone]

## 2011-08-16 NOTE — Progress Notes (Signed)
Patient did not experience any of the following events: a burn prior to discharge; a fall within the facility; wrong site/side/patient/procedure/implant event; or a hospital transfer or hospital admission upon discharge from the facility. (G8907) Patient did not have preoperative order for IV antibiotic SSI prophylaxis. (G8918)  

## 2011-08-17 ENCOUNTER — Telehealth: Payer: Self-pay | Admitting: *Deleted

## 2011-08-17 NOTE — Telephone Encounter (Signed)
Follow up Call- Patient questions:  Do you have a fever, pain , or abdominal swelling? no Pain Score  0 *  Have you tolerated food without any problems? yes  Have you been able to return to your normal activities? yes  Do you have any questions about your discharge instructions: Diet   no Medications  no Follow up visit  no  Do you have questions or concerns about your Care? no  Actions: * If pain score is 4 or above: No action needed, pain <4. Pt. Wife stated he had a small amt. Of blood when he went to bathroom yesterday, but it was just a small amt. Informed her to call us if he has 1/2 cup or more, that he may have just a small amt. Of bleeding. Pt then got on phone and stated that everything was o.k.

## 2011-08-19 ENCOUNTER — Ambulatory Visit: Payer: Medicare Other | Admitting: Internal Medicine

## 2011-08-22 ENCOUNTER — Encounter: Payer: Self-pay | Admitting: Gastroenterology

## 2011-08-24 ENCOUNTER — Ambulatory Visit (INDEPENDENT_AMBULATORY_CARE_PROVIDER_SITE_OTHER): Payer: Medicare Other | Admitting: Internal Medicine

## 2011-08-24 ENCOUNTER — Other Ambulatory Visit (INDEPENDENT_AMBULATORY_CARE_PROVIDER_SITE_OTHER): Payer: Medicare Other

## 2011-08-24 ENCOUNTER — Telehealth: Payer: Self-pay | Admitting: Internal Medicine

## 2011-08-24 ENCOUNTER — Encounter: Payer: Self-pay | Admitting: Internal Medicine

## 2011-08-24 VITALS — BP 112/60 | HR 80 | Temp 97.6°F | Ht 67.0 in | Wt 133.0 lb

## 2011-08-24 DIAGNOSIS — M3130 Wegener's granulomatosis without renal involvement: Secondary | ICD-10-CM

## 2011-08-24 DIAGNOSIS — J479 Bronchiectasis, uncomplicated: Secondary | ICD-10-CM

## 2011-08-24 LAB — BASIC METABOLIC PANEL
BUN: 25 mg/dL — ABNORMAL HIGH (ref 6–23)
CO2: 26 mEq/L (ref 19–32)
Calcium: 8.7 mg/dL (ref 8.4–10.5)
GFR: 63.66 mL/min (ref >60.00)
Glucose, Bld: 107 mg/dL — ABNORMAL HIGH (ref 70–99)

## 2011-08-24 MED ORDER — LEVOFLOXACIN 750 MG PO TABS: 750.0000 mg | ORAL_TABLET | Freq: Every day | ORAL | Status: DC

## 2011-08-24 NOTE — Telephone Encounter (Signed)
MW please advise, I didn't see anything about a urine specimen being done.

## 2011-08-24 NOTE — Patient Instructions (Addendum)
Try prednisone 5 mg one half pill alternate days to see if any worse respiratory symptoms or nausea, fatigue get worse  Ok to adjust the mucinex dm to take up to 2 every hours as needed  For itching sneezing and runny nose, use the loratidine 10 mg (clariton)  Please remember to go to the lab department downstairs for your tests - we will call you with the results when they are available.     Please schedule a follow up visit in 3 months but call sooner if needed

## 2011-08-24 NOTE — Assessment & Plan Note (Addendum)
  Lab Results  Component Value Date   CREATININE 1.2 08/24/2011   CREATININE 1.44* 07/15/2011   CREATININE 1.2 04/13/2011   No evidence of dz activity or renal involvement   The goal with a chronic steroid dependent illness is always arriving at the lowest effective dose that controls the disease/symptoms and not accepting a set "formula" which is based on statistics or guidelines that don't always take into account patient  variability or the natural hx of the dz in every individual patient, which may well vary over time.  For now therefore I recommend the patient maintain  A floor of 2.5 mg qod

## 2011-08-24 NOTE — Progress Notes (Signed)
Subjective:    Patient ID: Richard [name], male    DOB: [birth year]    MRN: [id number]  HPI 89  yowm never smoker with dx of Longstanding bronchiectasis then dx with WG 03/2010 > requested establish with Jaidence Geisler.   July 08, 2009- Bronchiectasis, chronic bronchits, allergic rhinitis  Had flu vax and has had at least 2 pneumovax.   Admit [facility] dx WG by pos c anca 03/2010, supportive tbbx but not vats   Admit [name] dx spont L Ptx 9/08/15/09 requiring Chest tube but no surgery   05/24/10--NP ov/ med reivew. Since last visit. has been hospitalized 05/12/2010-- 10/10/2011for Right pneumothorax., with underlying Wegener granulomatosis. and . Bronchiectasis. Pt was found to have Right Pneumo. Chest tube was placed. with improvement and removed 10/7. Xray on 10/10 w/ resolved pnuemo. He was continued on cytoxan and bactrim for PCP prophylaxis. rec. Prednisone 20 mg reduce to one half daily  Aspirin 81 mg one daily with breakfast but stop for any bleeding  See Patient Care Coordinator before leaving for cardiology appt > saw Dr [name], ? LHC needed   July 21, 2010 ov sob better no longer needing any 02 at all during day, no hemoptysis , producing occ yellow mucus - reports intermittent noct bilateral shoulder pain better with streching, moving, massage. no assoc nausea or sweats. recd try pred 5 mg daily   August 24, 2010 ov cough w/ yellow phlem, breathing is better, using flutter and med calendar consistently. R chest pain with deep breathing no change since R chest tube removed in 05/2010.   October 13, 2010 --Presents for an acute office visit. Complains of productive cough with yellow mucus, sore throat, rattling in chest, low grade temp x3days. Currently on pred 1/2 every other day . OTC not helping. rec continue qod pred, rx with augmentin x 10 days   11/16/2010 ov all smiles, no change on days of prednisone, minimal discolored sputum.   Try off prednisone completely.  If you feel a lot worse over the  next  week(worse breathing/ coughing/ nausea or loss of appetite) resume prednisone 5 mg one half daily until better then return back one half every other day  Late add:  Needs B12 and RBC Folate next ov  12/16/2010 ov/Dynastee Brummell cc no better on 5mg  dosed one half every other day (not the dose rec), still nauseated, still discolored p doxy complete May 7, sob in shower but not as much walking and no longer using 02, struggling with contingency plans. rec Work on inhaler technique:  If short of breath go ahead and use the xopenex hfa 2 puffs every 4 hours  Prednisone 5 mg 4 daily until better, then 2 daily x 5 days, then 1 daily thereafter   12/31/10 ov/[name], exac of bronchiectasis/wheeze no hemoptysis rx with pred burst and levaquin 750 x 5d  01/13/2011 ov/Caralee Morea  Cc sob and cough better now tapered prednisone to 5 mg each am using xop once daily around noon no problem with sleep or am exac of cough.   rec  02/11/2011 [name] cc recurrent hemoptysis on top of yellow that usually comes up esp in am x one year but no epistaxis, no cp or arthralgias or increased doe.  rec Levaquin 750 mg x 5 days courses whenever mucus turns bloody, more dark or with high fever.  Stop cytoxan and sulfasoxazole   Don't take aspirin if any active bleeding   03/16/2011 f/u ov/Parish Dubose cc no more blood,  Minimal yellow mucus,  back on asa 81 without hemoptysis. No sob rec  Try prednisone 5 mg  One on even and a half on odd   Acute Visit 04/26/11 -[name]  Cc  increased chest congestion, yellow sputum beginning about 1 week ago. He took a levaquin x 5 days (9/11 - 9/15). Has a sore throat, getting better. Nasal drainage and sinus drainage worse over last 3 -4 days. He tells me that he cut grass last Thursday, may have started this. No sick contacts.  rec Please start levaquin daily for another 5 days Continue your current prednisone dosing  Start loratadine 10mg  daily until your next visit    05/27/2011 f/u ov/Samaya Boardley on 2.5 mg  pred /day  cc still coughing, congested though much less purulent sputum p levaquin and no hemoptysis.  Does note fatigue > baseline  With doe worse x garbage cans to street and back and has not tried the xopenex at all. rec Try using xopenex as needed for short of breath  Only take the loratidine (clariton) if needed for itching sneezing runny nose     07/19/2011 ER follow up 07/15/11 with increased cough, congestion and fever. Dx with acute bronchiectasis and possible influenza. He has started on Levaquin 750mg  x 5 days w/ last day today. Also given Tamiflu but did not fill this. Fevers are decreasing. Cough and congesiton are improved but not gone. Still feels weak.CXR w/o acute changes.  Labs from ER reviewed with elevated WBC 11k, w/ left shift. Neg strep test. No influenza data found.  rec Extend Levaquin daily for 5 days  Mucinex DM Twice daily  As needed  Cough/congestion  Fluids and rest  Tylenol As needed  Fever   08/24/2011 f/u ov/Esly Selvage on Prednisone 5 mg one half each am  cc cough/ congestion back to baseline, no limiting sob,    Sleeping ok without nocturnal  or early am exacerbation  of respiratory  c/o's or need for noct saba. Also denies any obvious fluctuation of symptoms with weather or environmental changes or other aggravating or alleviating factors except as outlined above   ROS  At present neg for  any significant sore throat, dysphagia, itching, sneezing,  nasal congestion or excess/ purulent secretions,  fever, chills, sweats, unintended wt loss, pleuritic or exertional cp, hempoptysis, orthopnea pnd or leg swelling.  Also denies presyncope, palpitations, heartburn, abdominal pain, nausea, vomiting, diarrhea  or change in bowel or urinary habits, dysuria,hematuria,  rash, arthralgias, visual complaints, headache, numbness weakness or ataxia.      PMHx:  Allergic rhinitis  GERD- hiatal hernia with reflux  - Repeat Barium Swallow c/w stricture and reflux 03/29/10    Bronchiectasis  - See CT chest 12/14/2009 > progressive bilateral R> L cavities 03/26/10 > clinical dx Wegener's 04/07/10  - PFTs 02/17/10 FEV1 1.52 (58%) ratio 42 and 15% better after B2, DLC0 118%  WEGENER'S GRANULOMATOSIS  - dX BY pos C anca/ supportive TBBX 04/10/10 > CYTOXAN initiated  > trial off 02/12/2011  - Prednisone tapered off March 2012 > resumed 12/16/2010 daily due to nausea and worse cough PAF/ abn echo during [facility] admit 03/30/10  - ECH0 Aug 23 2011LVH with low nl ef, mild mr, ok R function, peak trop .59  - Not anticoagulated due to WG with Hemoptysis  L PTX 04/25/10  R PTX 05/12/10 > final f/u by [name] 06/01/10  benign hypertrophy of the prostate  Complex med regimen--Meds reviewed with pt education and computerized med calendar May 25, 2010  Family History:  Mother- deceased age 76;car wreck  Father- deceased age 75; breathing problems;asthma  Brother- living age 105; heart CAD (s/p CABG)  Sibling- deceased age 53; heart (s/p CABG)   Social History:  Retired  Never smoker  Married with 3 children          Objective:  Physical Exam  wt   128  04/26/11  >  130   05/27/2011 > 07/08/2011  134>128 07/19/2011 > 08/24/2011  133 amb thin pleasant wm nad  SKIN: no rash, lesions  NODES: no lymphadenopathy  HEENT: [place]/AT, EOM- WNL, Conjuctivae- clear, PERRLA, TM-WNL, Nose- clear, Throat- clear and wnl, Mallampati III  NECK: Supple w/ fair ROM, JVD- none, normal carotid impulses w/o bruits Thyroid-  CHEST: no wheeze, coarse BS  HEART: RRR, no m/g/r heard  ABDOMEN: Soft and nl  CXR : 07/15/11  No evidence of active pulmonary disease. Stable chronic changes of  emphysema, chronic bronchitis, bronchiectasis, mucus plugging, and  fibrosis. Esophageal hiatal hernia behind the heart.      Assessment & Plan:

## 2011-08-24 NOTE — Telephone Encounter (Signed)
Kidney test was on blood, not urine, so nothing further needed

## 2011-08-24 NOTE — Telephone Encounter (Signed)
[id number] x1 

## 2011-08-25 NOTE — Telephone Encounter (Signed)
Pt returned call. Richard [name]  

## 2011-08-25 NOTE — Telephone Encounter (Signed)
Called and spoke with pt.  Informed him of MW's response.  Pt verbalized understanding.  Nothing further needed.

## 2011-08-26 NOTE — Assessment & Plan Note (Signed)
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 This was done in the context of a medication calendar review which provided the patient with a user-friendly unambiguous mechanism for medication administration and reconciliation and provides an action plan for all active problems. It is critical that this be shown to every doctor  for modification during the office visit if necessary so the patient can use it as a working document.

## 2011-09-23 IMAGING — CR DG CHEST 2V
2 series · 2 of 2 positions shown · non-contrast
Comparison: Plain films of the chest 11/16/2010, 07/21/2010 and
12/14/2009.  CT chest 03/26/2010.

CLINICAL DATA: Shortness of breath and hemoptysis.  COPD.

CHEST - 2 VIEW

[view not recorded (1 of 2)]
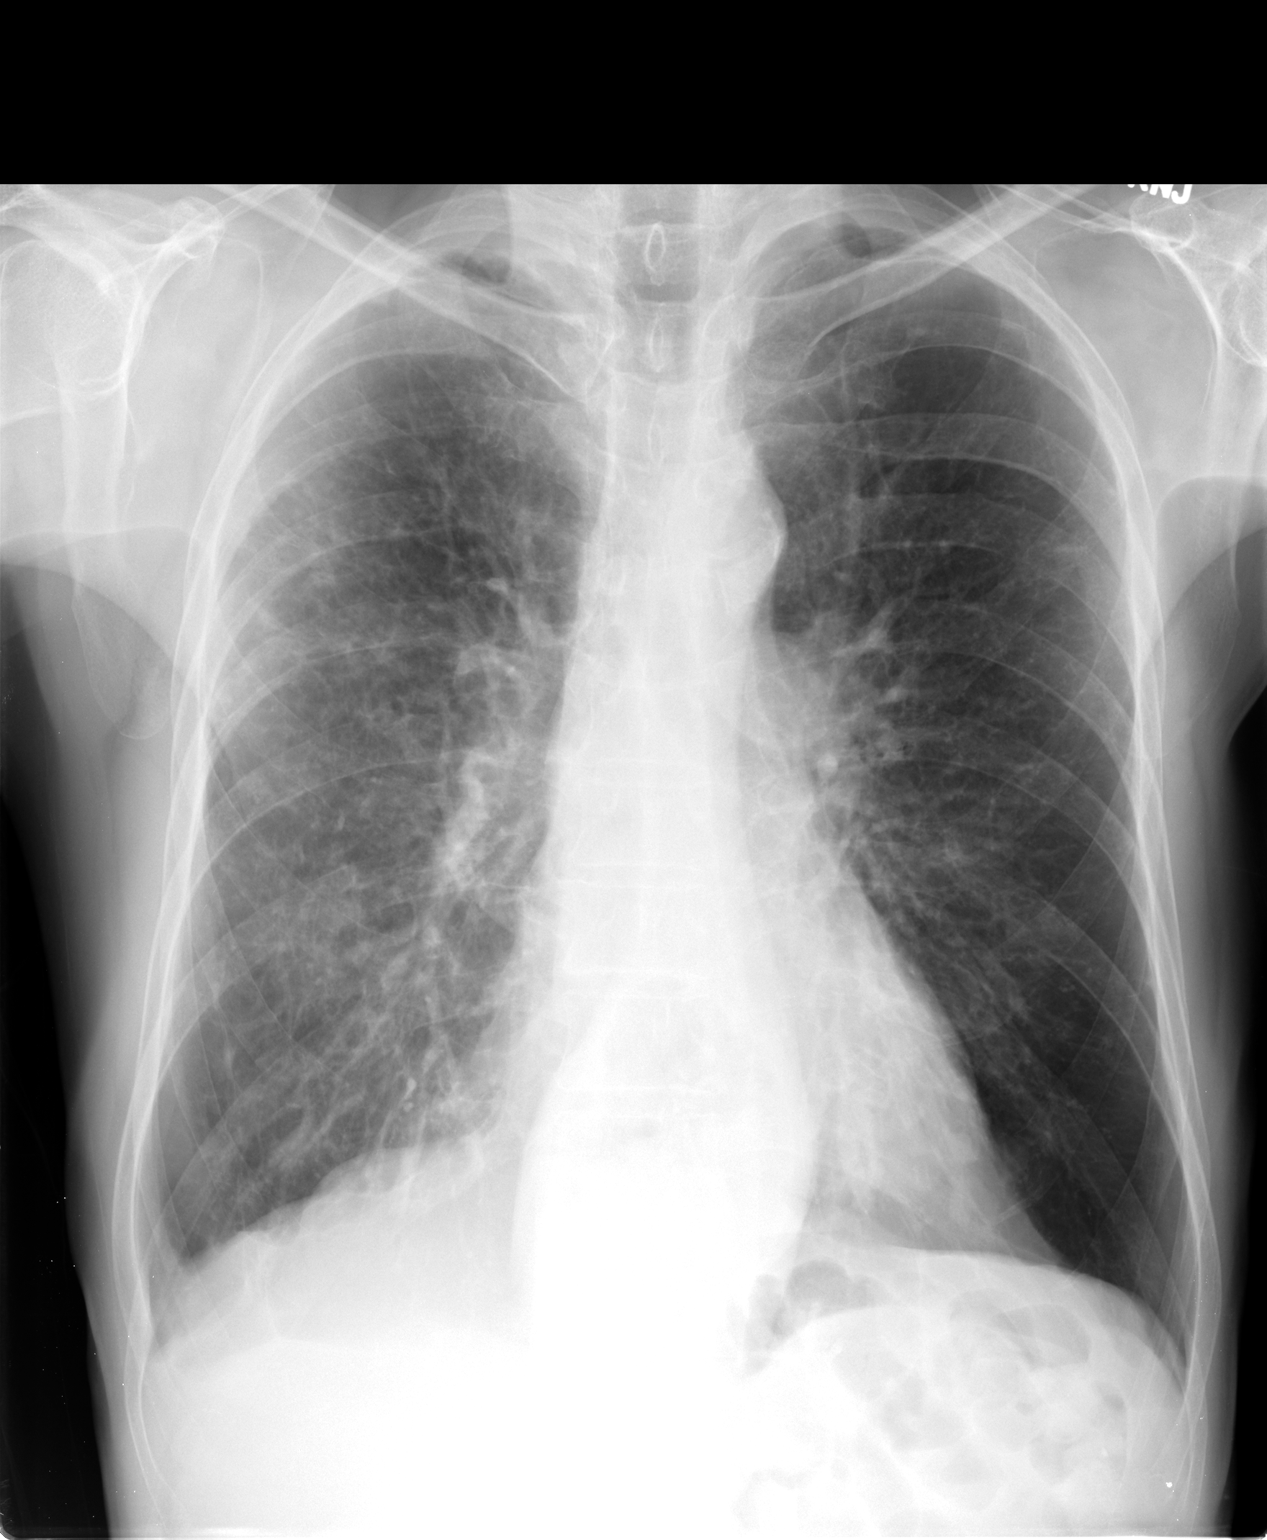

[view not recorded (2 of 2)]
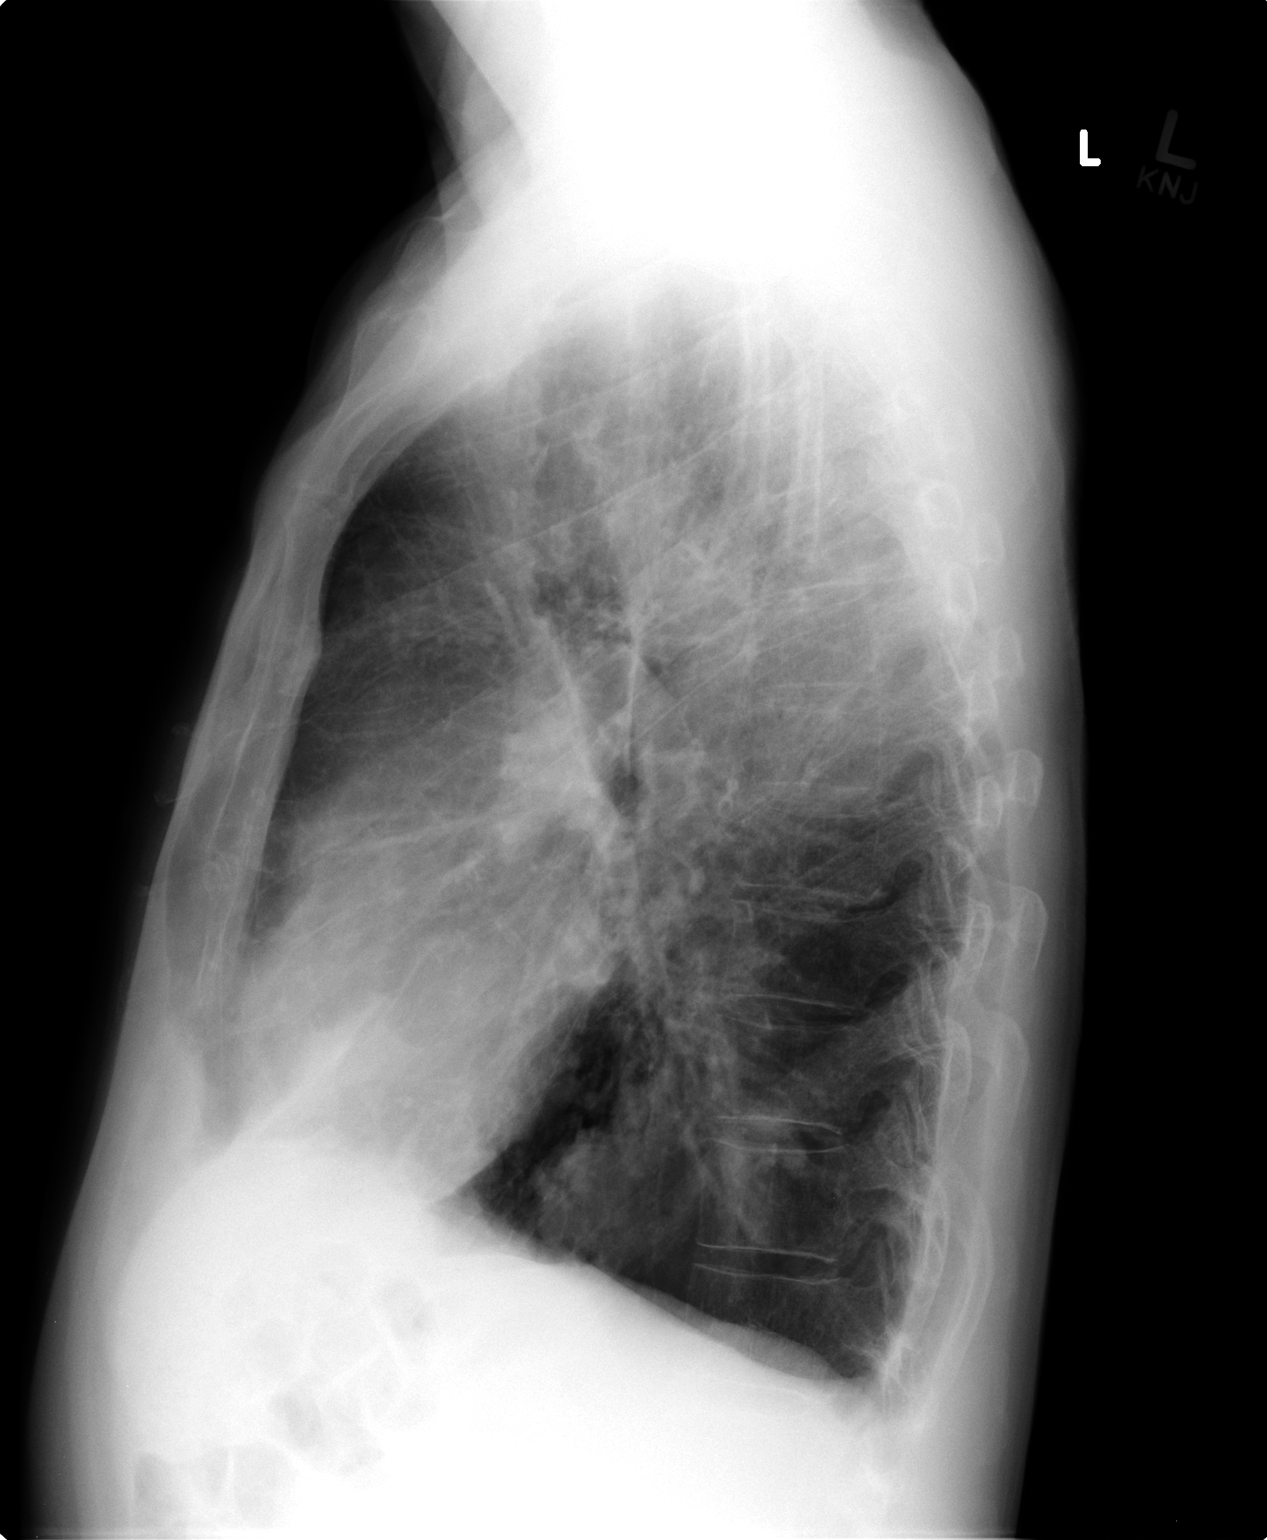

[2 of 2 positions shown; findings below may reference images not displayed]

FINDINGS: Reticulonodular opacities throughout the right lung are
not notably changed since the 07/21/2010 study.  Left lung appears
clear.  No pneumothorax or effusion.  Hiatal hernia is noted.
Heart size normal.
IMPRESSION: No acute finding.  Stable compared prior exam.

## 2011-09-27 ENCOUNTER — Telehealth: Payer: Self-pay | Admitting: Internal Medicine

## 2011-09-27 NOTE — Telephone Encounter (Signed)
Called, spoke with pt.  Pt c/o pain in throat and coughing up yellow mucus which started Sunday and has improved some today.  States he recently finished a levaquin rx and is currently taking another one.  He is unsure if the levaquin is working or if he should be on something else.  OV scheduled with MR tomorrow am at 11:30am and advised Urgent Care/ER if symptoms worsen prior to this and feels he needs to be seen sooner.  He verbalized understanding of this.

## 2011-09-28 ENCOUNTER — Encounter: Payer: Self-pay | Admitting: Internal Medicine

## 2011-09-28 ENCOUNTER — Ambulatory Visit (INDEPENDENT_AMBULATORY_CARE_PROVIDER_SITE_OTHER)
Admission: RE | Admit: 2011-09-28 | Discharge: 2011-09-28 | Disposition: A | Discharge status: Home / Self Care | Payer: Medicare Other | Source: Ambulatory Visit | Attending: Internal Medicine | Admitting: Internal Medicine

## 2011-09-28 ENCOUNTER — Ambulatory Visit (INDEPENDENT_AMBULATORY_CARE_PROVIDER_SITE_OTHER): Payer: Medicare Other | Admitting: Internal Medicine

## 2011-09-28 VITALS — BP 112/68 | HR 93 | Temp 98.3°F | Ht 67.0 in | Wt 129.8 lb

## 2011-09-28 DIAGNOSIS — J471 Bronchiectasis with (acute) exacerbation: Secondary | ICD-10-CM

## 2011-09-28 MED ORDER — CEFDINIR 300 MG PO CAPS: 300.0000 mg | ORAL_CAPSULE | Freq: Two times a day (BID) | ORAL | Status: AC

## 2011-09-28 MED ORDER — PREDNISONE 10 MG PO TABS: ORAL_TABLET | ORAL | Status: DC

## 2011-09-28 NOTE — Assessment & Plan Note (Signed)
You are probably having an exacerbation or attack of bronchiectasis To figure out if there is pneumonia please have chest xray; will inform you of results in next day or so Please stop levaquin STart omnicef generic 300mg  twice daily x 7 days; call us if expensive Please take Take prednisone 40mg  once daily x 3 days, then 30mg  once daily x 3 days, then 20mg  once daily x 3 days, then prednisone 10mg  once daily  x 3 days and then 5mg  daily x 3 days and then to your baseline regimen of alternating Return to see Dr [name] as scheduled before Call or come sooner if not better or getting worse

## 2011-09-28 NOTE — Patient Instructions (Signed)
You are probably having an exacerbation or attack of bronchiectasis To figure out if there is pneumonia please have chest xray; will inform you of results in next day or so Please stop levaquin STart omnicef generic 300mg twice daily x 7 days; call us if expensive Please take Take prednisone 40mg once daily x 3 days, then 30mg once daily x 3 days, then 20mg once daily x 3 days, then prednisone 10mg once daily  x 3 days and then 5mg daily x 3 days and then to your baseline regimen of alternating Return to see Dr [name] as scheduled before Call or come sooner if not better or getting worse   

## 2011-09-28 NOTE — Progress Notes (Signed)
Subjective:    Patient ID: Richard [name], male    DOB: 03/26/1936, 76 y.o.   MRN: [id number]  HPI 71  yowm never smoker with dx of Longstanding bronchiectasis then dx with WG 03/2010 > requested establish with [name].   July 08, 2009- Bronchiectasis, chronic bronchits, allergic rhinitis  Had flu vax and has had at least 2 pneumovax.   Admit [facility] dx WG by pos c anca 03/2010, supportive tbbx but not vats   Admit [name] dx spont L Ptx 9/08/15/09 requiring Chest tube but no surgery   05/24/10--NP ov/ med reivew. Since last visit. has been hospitalized 05/12/2010-- 10/10/2011for Right pneumothorax., with underlying Wegener granulomatosis. and . Bronchiectasis. Pt was found to have Right Pneumo. Chest tube was placed. with improvement and removed 10/7. Xray on 10/10 w/ resolved pnuemo. He was continued on cytoxan and bactrim for PCP prophylaxis. rec. Prednisone 20 mg reduce to one half daily  Aspirin 81 mg one daily with breakfast but stop for any bleeding  See Patient Care Coordinator before leaving for cardiology appt > saw Dr [name], ? LHC needed   July 21, 2010 ov sob better no longer needing any 02 at all during day, no hemoptysis , producing occ yellow mucus - reports intermittent noct bilateral shoulder pain better with streching, moving, massage. no assoc nausea or sweats. recd try pred 5 mg daily   August 24, 2010 ov cough w/ yellow phlem, breathing is better, using flutter and med calendar consistently. R chest pain with deep breathing no change since R chest tube removed in 05/2010.   October 13, 2010 --Presents for an acute office visit. Complains of productive cough with yellow mucus, sore throat, rattling in chest, low grade temp x3days. Currently on pred 1/2 every other day . OTC not helping. rec continue qod pred, rx with augmentin x 10 days   11/16/2010 ov all smiles, no change on days of prednisone, minimal discolored sputum.   Try off prednisone completely.  If you feel a lot worse  over the next  week(worse breathing/ coughing/ nausea or loss of appetite) resume prednisone 5 mg one half daily until better then return back one half every other day  Late add:  Needs B12 and RBC Folate next ov  12/16/2010 ov/[name] cc no better on 5mg  dosed one half every other day (not the dose rec), still nauseated, still discolored p doxy complete May 7, sob in shower but not as much walking and no longer using 02, struggling with contingency plans. rec Work on inhaler technique:  If short of breath go ahead and use the xopenex hfa 2 puffs every 4 hours  Prednisone 5 mg 4 daily until better, then 2 daily x 5 days, then 1 daily thereafter   12/31/10 ov/[name], exac of bronchiectasis/wheeze no hemoptysis rx with pred burst and levaquin 750 x 5d  01/13/2011 ov/[name]  Cc sob and cough better now tapered prednisone to 5 mg each am using xop once daily around noon no problem with sleep or am exac of cough.   rec  02/11/2011 Richard [name] cc recurrent hemoptysis on top of yellow that usually comes up esp in am x one year but no epistaxis, no cp or arthralgias or increased doe.  rec Levaquin 750 mg x 5 days courses whenever mucus turns bloody, more dark or with high fever.  Stop cytoxan and sulfasoxazole   Don't take aspirin if any active bleeding   03/16/2011 f/u ov/[name] cc no more blood,  Minimal yellow  mucus, back on asa 81 without hemoptysis. No sob rec  Try prednisone 5 mg  One on even and a half on odd   Acute Visit 04/26/11 -Richard [name]  Cc  increased chest congestion, yellow sputum beginning about 1 week ago. He took a levaquin x 5 days (9/11 - 9/15). Has a sore throat, getting better. Nasal drainage and sinus drainage worse over last 3 -4 days. He tells me that he cut grass last Thursday, may have started this. No sick contacts.  rec Please start levaquin daily for another 5 days Continue your current prednisone dosing  Start loratadine 10mg  daily until your next visit    05/27/2011 f/u ov/[name] on  2.5 mg pred /day  cc still coughing, congested though much less purulent sputum p levaquin and no hemoptysis.  Does note fatigue > baseline  With doe worse x garbage cans to street and back and has not tried the xopenex at all. rec Try using xopenex as needed for short of breath  Only take the loratidine (clariton) if needed for itching sneezing runny nose     07/19/2011 ER follow up 07/15/11 with increased cough, congestion and fever. Dx with acute bronchiectasis and possible influenza. He has started on Levaquin 750mg  x 5 days w/ last day today. Also given Tamiflu but did not fill this. Fevers are decreasing. Cough and congesiton are improved but not gone. Still feels weak.CXR w/o acute changes.  Labs from ER reviewed with elevated WBC 11k, w/ left shift. Neg strep test. No influenza data found.  rec Extend Levaquin daily for 5 days  Mucinex DM Twice daily  As needed  Cough/congestion  Fluids and rest  Tylenol As needed  Fever   08/24/2011 f/u ov/[name] on Prednisone 5 mg one half each am  cc cough/ congestion back to baseline, no limiting sob,    Sleeping ok without nocturnal  or early am exacerbation  of respiratory  c/o's or need for noct saba. Also denies any obvious fluctuation of symptoms with weather or environmental changes or other aggravating or alleviating factors except as outlined above   ROS  At present neg for  any significant sore throat, dysphagia, itching, sneezing,  nasal congestion or excess/ purulent secretions,  fever, chills, sweats, unintended wt loss, pleuritic or exertional cp, hempoptysis, orthopnea pnd or leg swelling.  Also denies presyncope, palpitations, heartburn, abdominal pain, nausea, vomiting, diarrhea  or change in bowel or urinary habits, dysuria,hematuria,  rash, arthralgias, visual complaints, headache, numbness weakness or ataxia.   REC Try prednisone 5 mg one half pill alternate days to see if any worse respiratory symptoms or nausea, fatigue get worse    Ok to adjust the mucinex dm to take up to 2 every hours as needed  For itching sneezing and runny nose, use the loratidine 10 mg (clariton)  Please remember to go to the lab department downstairs for your tests - we will call you with the results when they are available.  Please schedule a follow up visit in 3 months but call sooner if needed    OV 09/28/2011 ACUTE OFFICE VISIT WITH DR RAMASWAMY  - 54 y old male patient of Dr Sherene [name] with bronchiectasis, wegener (intolerant to cytoxan and maintained on low dose alternate day prednisone), cachexia (Body mass index is 20.33 kg/(m^2).Marland Kitchen Presents with wife. PAst 3-4 days increased cough, change in color sputum to yellow, wheeze, chest tightness, and fatigue. Denies fever. Today is day 3 of levaquin which has helped cough intensity and sputum  volume/color partially but still very fatigued. No improvement in wheeze. He and wife are very concerned that this could worsen to pneumonia and though better decided to seek acute visit.  Last cxr : 07/15/11  No evidence of active pulmonary disease. Stable chronic changes of  emphysema, chronic bronchitis, bronchiectasis, mucus plugging, and  fibrosis. Esophageal hiatal hernia behind the heart.    Past, Family, Social reviewed: no change since last visit     Review of Systems  Constitutional: Negative for fever and unexpected weight change.  HENT: Negative for ear pain, nosebleeds, congestion, sore throat, rhinorrhea, sneezing, trouble swallowing, dental problem, postnasal drip and sinus pressure.   Eyes: Negative for redness and itching.  Respiratory: Positive for cough, chest tightness and shortness of breath. Negative for wheezing.   Cardiovascular: Negative for palpitations and leg swelling.  Gastrointestinal: Negative for nausea and vomiting.  Genitourinary: Negative for dysuria.  Musculoskeletal: Negative for joint swelling.  Skin: Negative for rash.  Neurological: Negative for headaches.   Hematological: Does not bruise/bleed easily.  Psychiatric/Behavioral: Negative for dysphoric mood. The patient is not nervous/anxious.        Objective:   Physical Exam wt   128  04/26/11  >  130   05/27/2011 > 07/08/2011  134>128 07/19/2011 > 08/24/2011  133 amb thin pleasant wm nad  SKIN: no rash, lesions  NODES: no lymphadenopathy  HEENT: [place]/AT, EOM- WNL, Conjuctivae- clear, PERRLA, TM-WNL, Nose- clear, Throat- clear and wnl, Mallampati III  NECK: Supple w/ fair ROM, JVD- none, normal carotid impulses w/o bruits Thyroid-  CHEST: BILATERAL EXTENSIVE WHEEZE +. No resp distress HEART: RRR, no m/g/r heard  ABDOMEN: Soft and nl            Assessment & Plan:

## 2011-09-29 ENCOUNTER — Encounter: Payer: Self-pay | Admitting: Internal Medicine

## 2011-09-29 ENCOUNTER — Telehealth: Payer: Self-pay | Admitting: Internal Medicine

## 2011-09-29 NOTE — Telephone Encounter (Signed)
[id number].Richard [name], CMA  

## 2011-09-29 NOTE — Telephone Encounter (Signed)
Pt aware of results. Paublo Warshawsky, CMA  

## 2011-09-29 NOTE — Telephone Encounter (Signed)
Please let him know cxr fairly clear but there might be a touch of pneumonia in right upper lung. No change in Rx. Fu with Dr [name] who i will cc on this message

## 2011-09-29 NOTE — Telephone Encounter (Signed)
Pt returned call re: results. Richard [name]  °

## 2011-10-02 ENCOUNTER — Other Ambulatory Visit: Payer: Self-pay | Admitting: Family Medicine

## 2011-10-03 NOTE — Telephone Encounter (Signed)
Can we refill this? 

## 2011-10-06 ENCOUNTER — Telehealth: Payer: Self-pay | Admitting: Internal Medicine

## 2011-10-06 NOTE — Telephone Encounter (Signed)
Yes ok to take a full cycle of levaquin now

## 2011-10-06 NOTE — Telephone Encounter (Addendum)
Spoke with the pt and he states he he finished omnicef on Tuesday and is currently on day 8 of a 15 day prednisone taper which was prescribed by MR on 09/28/11. He states his SOB, and chest tightness is improved but not back to normal. Also he states that his productive cough with yellow phlegm is some worse. He states overall he feels better, but is concerned that his cough is worse. He states he has some levaquin left because MR stopped it and changed it to [facility], and he wants to know should he take this? Please advise. [name], CMA No Known Allergies

## 2011-10-06 NOTE — Telephone Encounter (Signed)
Spoke with pt and notified of recs per MW Pt verbalized understanding and states nothing further needed 

## 2011-10-26 IMAGING — CR DG CHEST 2V
2 series · 2 of 2 positions shown · non-contrast
Comparison: 02/11/2011.

CLINICAL DATA: History of Wegener's granulomatosis.  Follow-up.
History of bronchitis.

CHEST - 2 VIEW

[view not recorded (1 of 2)]
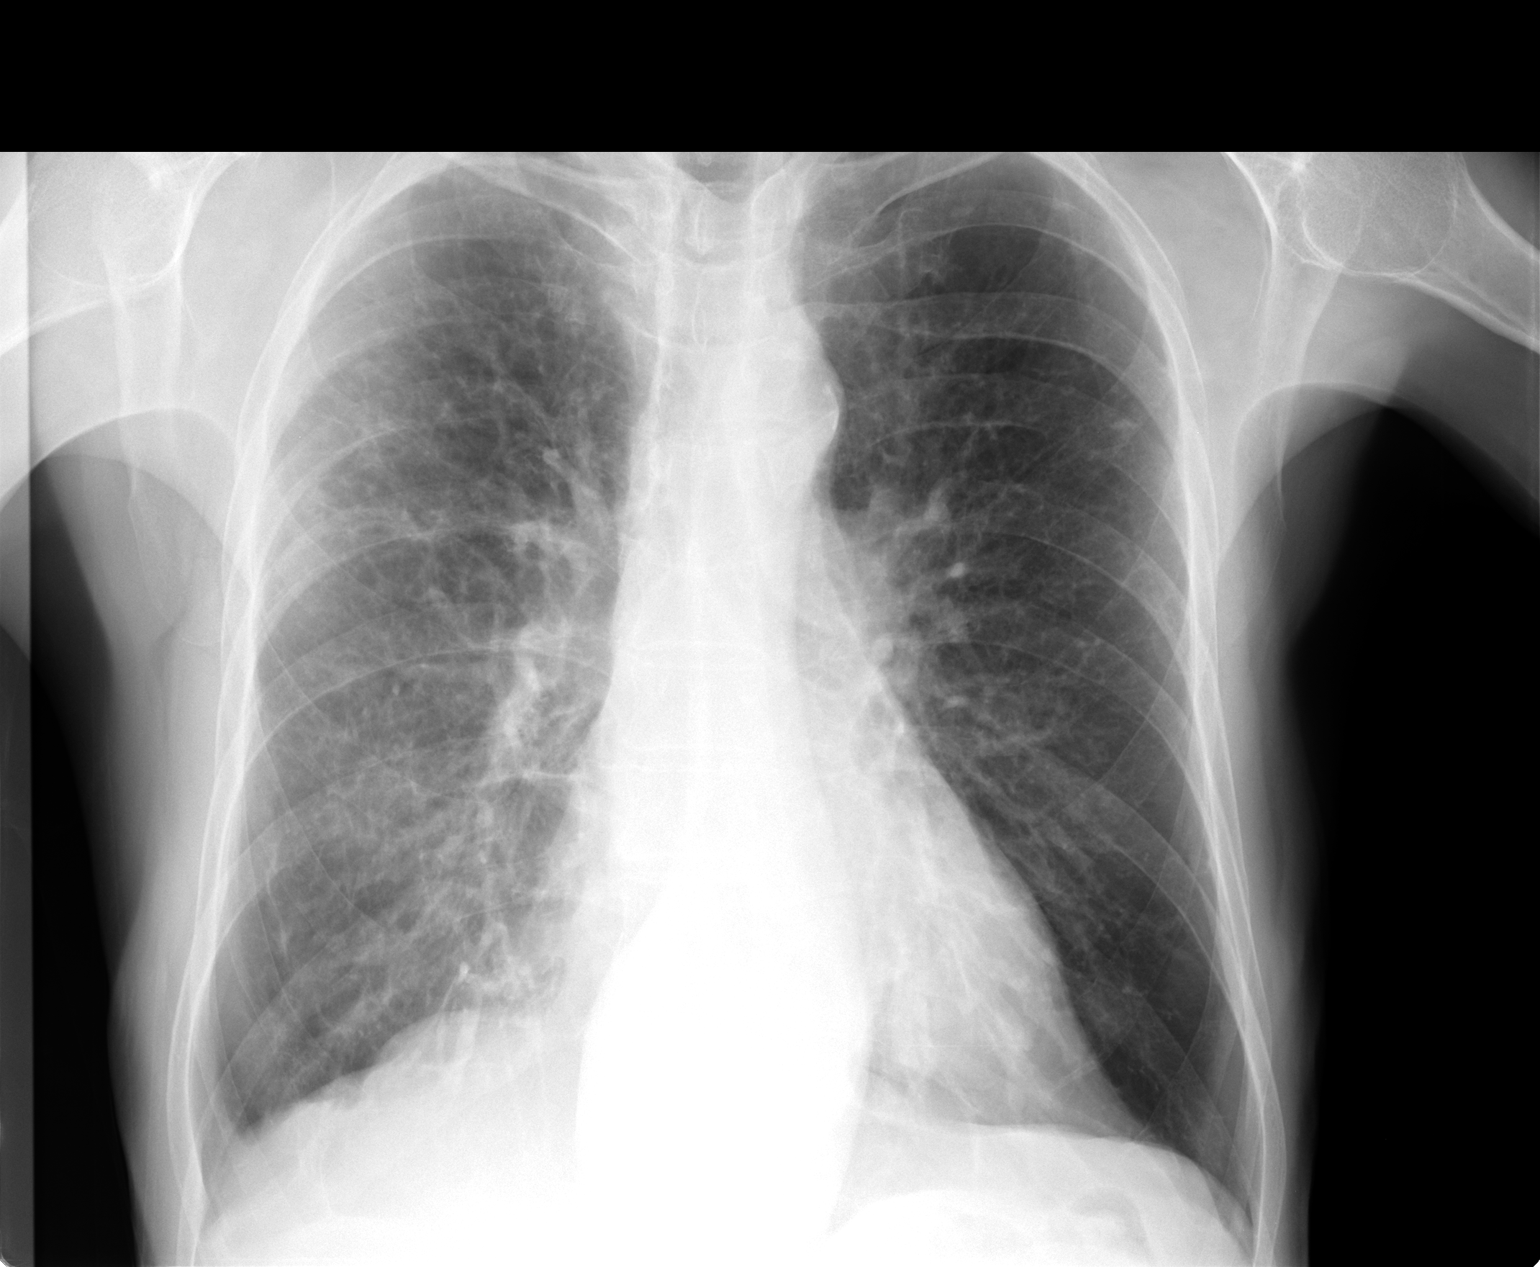

[view not recorded (2 of 2)]
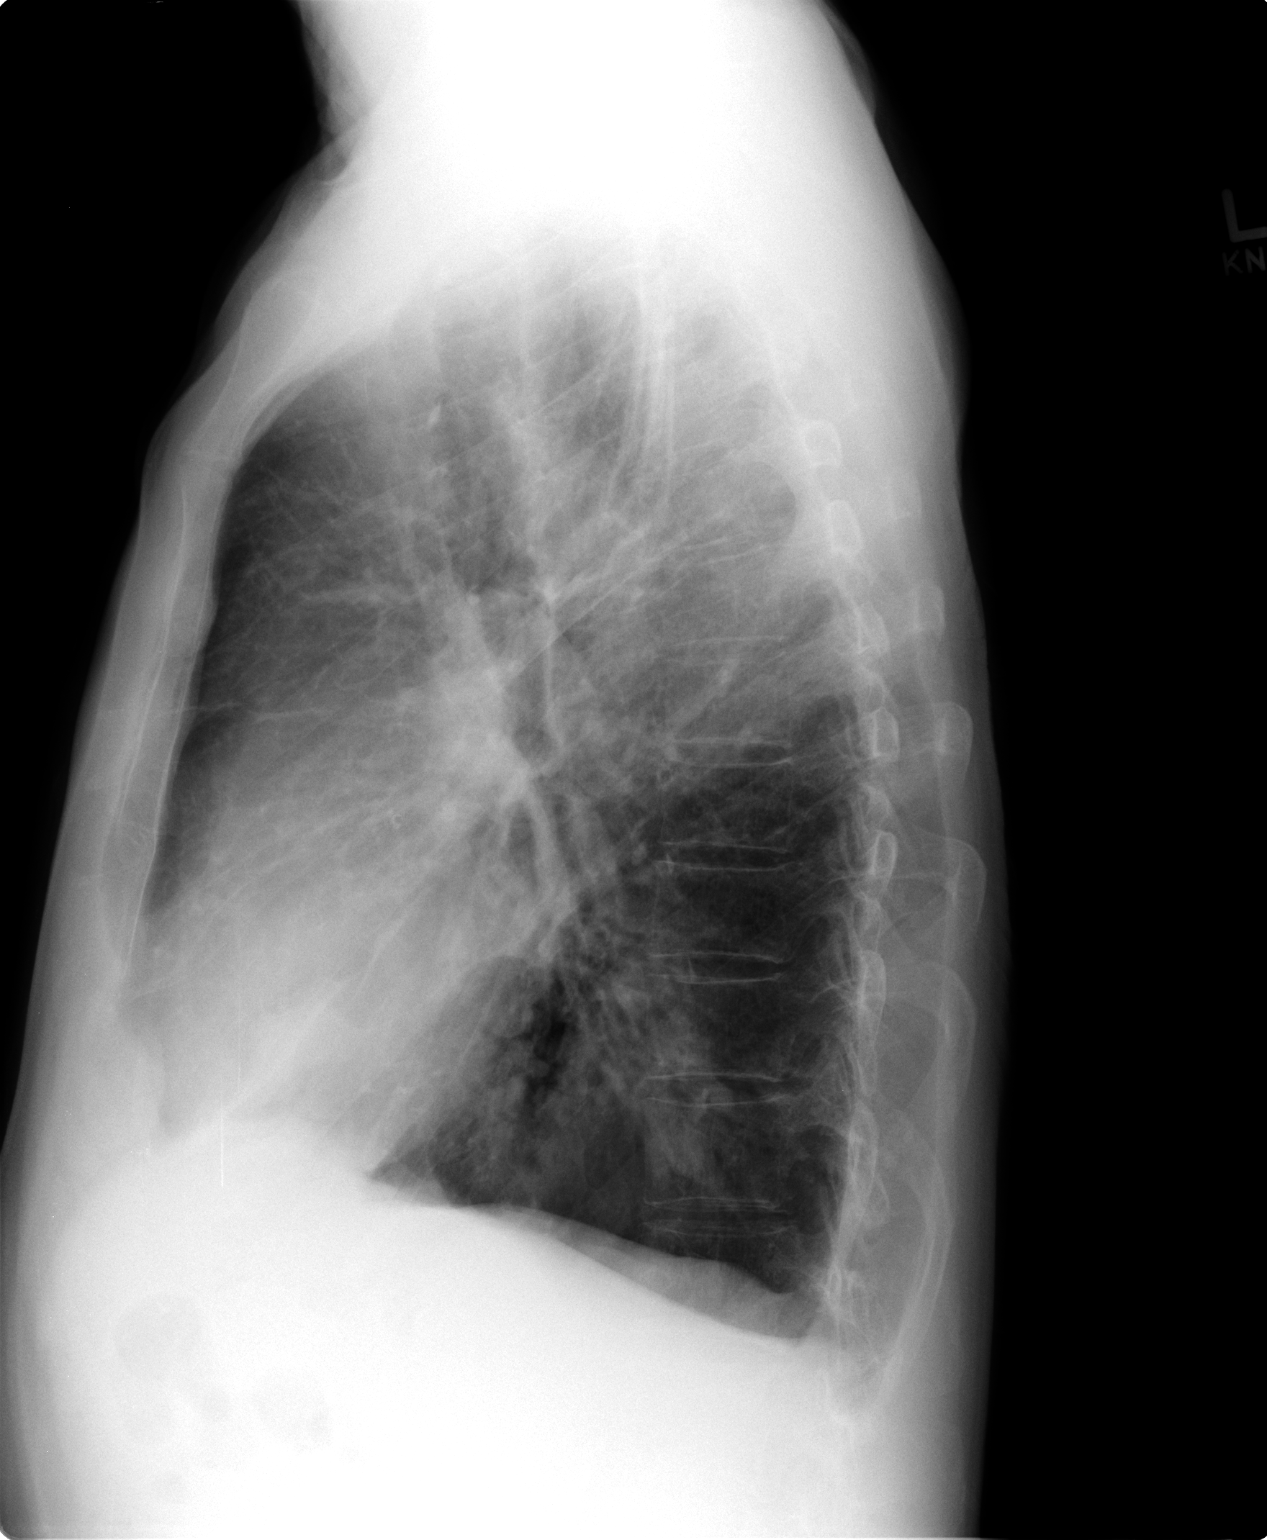

[2 of 2 positions shown; findings below may reference images not displayed]

FINDINGS: The cardiac silhouette is normal size and shape. Ectasia,
tortuosity, and nonaneurysmal calcification of the thoracic aorta
are seen. There is chronic increase in the reticular interstitial
markings within the lungs with areas of slight nodularity with
small opacities.  No definite progression since previous study is
seen.  No consolidation or pleural effusion is seen.  There is
flattening of the diaphragm on lateral image with generalized
hyperinflation configuration consistent with hyperinflation and
obstructive pulmonary disease.  There appears to be some central
peribronchial thickening.  There is osteopenic appearance of the
bones.  Hilar and mediastinal contours appear stable.  No definite
adenopathy is seen.
IMPRESSION: Chronic reticulonodular interstitial infiltrative densities do not
appear to have progressed since previous study.  History given of
Wegener's granulomatosis.  No adenopathy or pleural effusion is
seen.  There is generalized hyperinflation configuration consistent
with obstructive pulmonary disease.  There is central peribronchial
thickening.  No definite adenopathy is seen.

## 2011-10-31 ENCOUNTER — Telehealth: Payer: Self-pay | Admitting: Internal Medicine

## 2011-10-31 NOTE — Telephone Encounter (Signed)
Pt notified that it is okay to take the 4 extra levaquin that he has but if he has not improved or is not significantly better he needs an OV. Pt verbalized understanding.

## 2011-10-31 NOTE — Telephone Encounter (Signed)
It's fine to take the rest of the levaquin then needs ov if not back to baseline

## 2011-10-31 NOTE — Telephone Encounter (Signed)
Spoke with pt. He states just finished 7 days of levaquin for infection- started this on 2/28 and states that he feels some better, but still is producing some yellow sputum. He found that he has 4 extra tablets of the levaquin and wants to know if he should go ahead and take these. Does not want to come in for appt unless necc b/c his spouse is in the hospital. He has ov planned for 11/28/11. Please advise, thanks!

## 2011-11-07 ENCOUNTER — Telehealth: Payer: Self-pay | Admitting: Internal Medicine

## 2011-11-07 MED ORDER — AMOXICILLIN-POT CLAVULANATE 875-125 MG PO TABS: 1.0000 | ORAL_TABLET | Freq: Two times a day (BID) | ORAL | Status: AC

## 2011-11-07 MED ORDER — PREDNISONE 10 MG PO TABS: ORAL_TABLET | ORAL | Status: DC

## 2011-11-07 NOTE — Telephone Encounter (Signed)
augmentin twice daily x 10 days but needs ov if not improving by weekend, to er in meantime if worse plus Prednisone 10 mg take  4 each am x 2 days,   2 each am x 2 days,  1 each am x2days and stop

## 2011-11-07 NOTE — Telephone Encounter (Signed)
I spoke with pt and he stated he finished his Levaquin last Thursday and is still not feeling better. He c/o cough w/ yellow phlem, feels fatigue, some SOB, some wheezing x 2 weeks. Denies any fever, chills, sweats, nausea, vomiting, body aches. Their are no available openings until wed and pt states he does not feel like he can wait that long to be seen. Pt is requesting further recs from MW. Please advise Dr. [name], thanks  No Known Allergies

## 2011-11-07 NOTE — Telephone Encounter (Signed)
I spoke with pt and is aware of MW recs. He voiced his understanding and had no questions. rx has been sent to the pharmacy and nothing further was needed

## 2011-11-23 IMAGING — CR DG CHEST 2V
2 series · 2 of 2 positions shown · non-contrast
Comparison: [DATE]

CLINICAL DATA: Bronchiectasis.  [place].

CHEST - 2 VIEW

[view not recorded (1 of 2)]
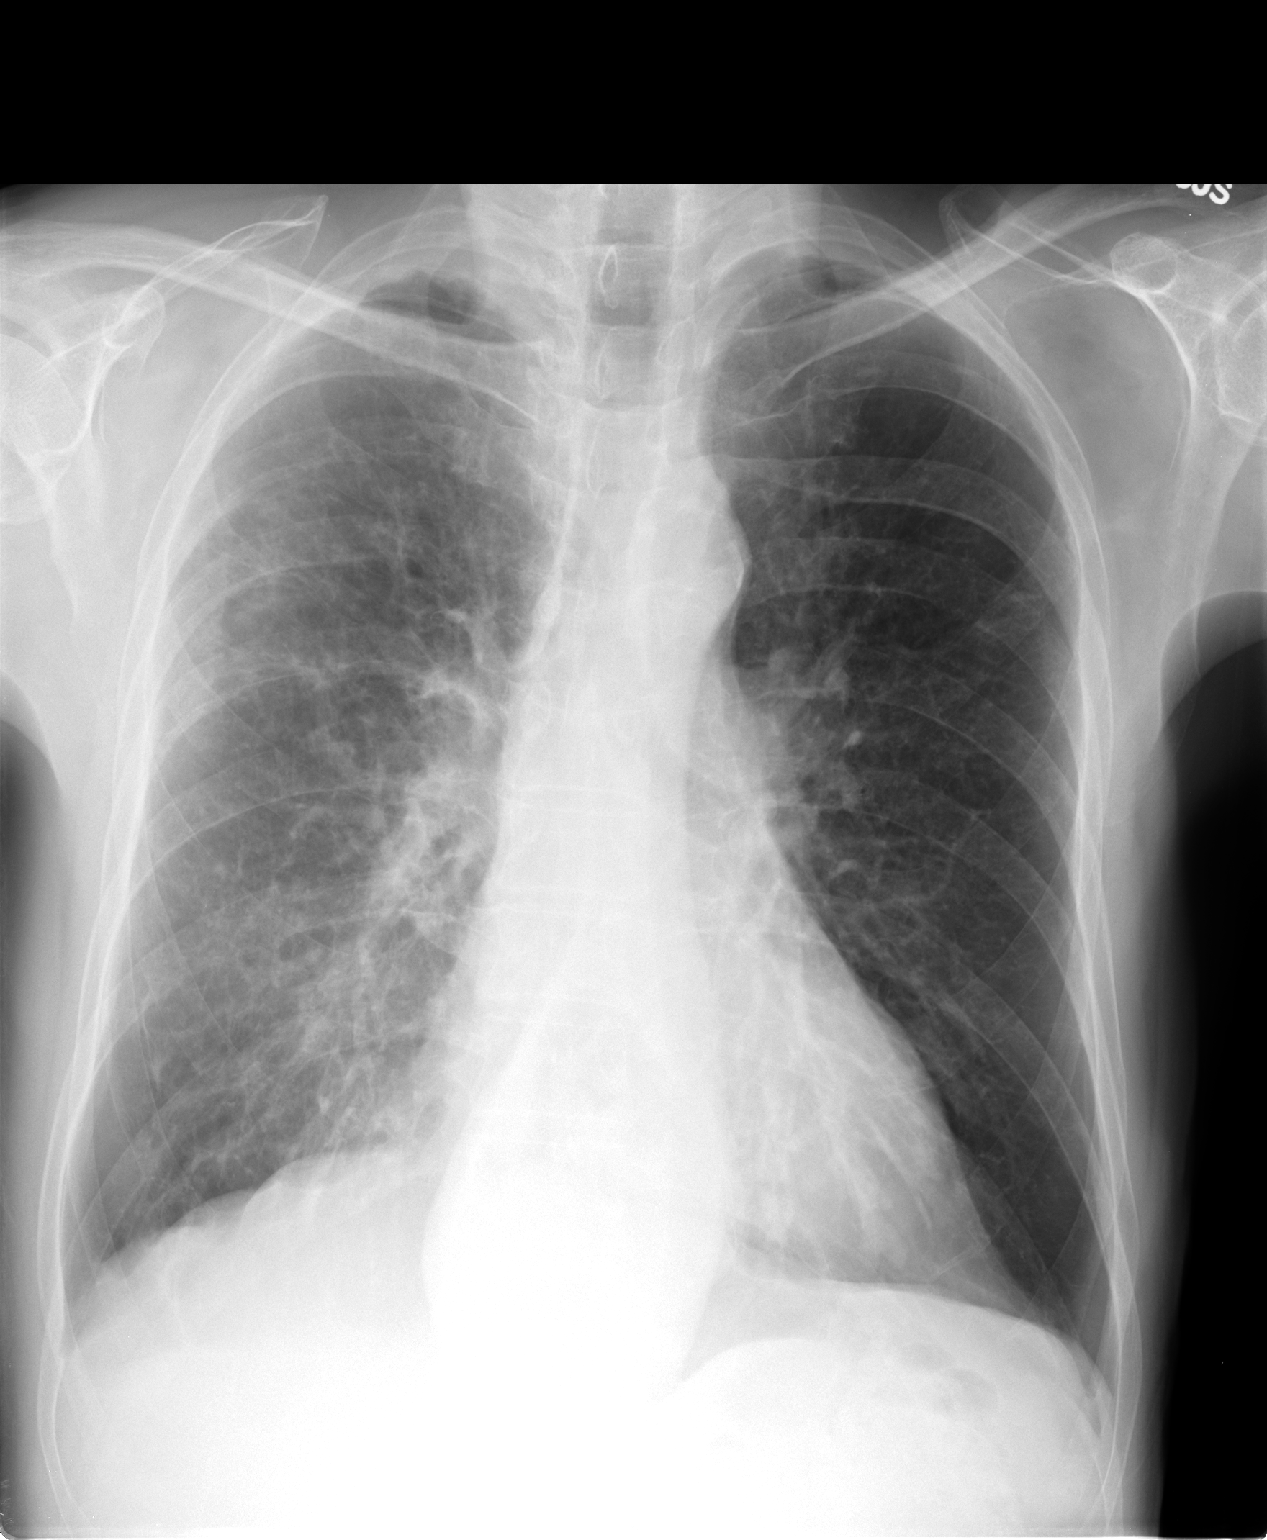

[view not recorded (2 of 2)]
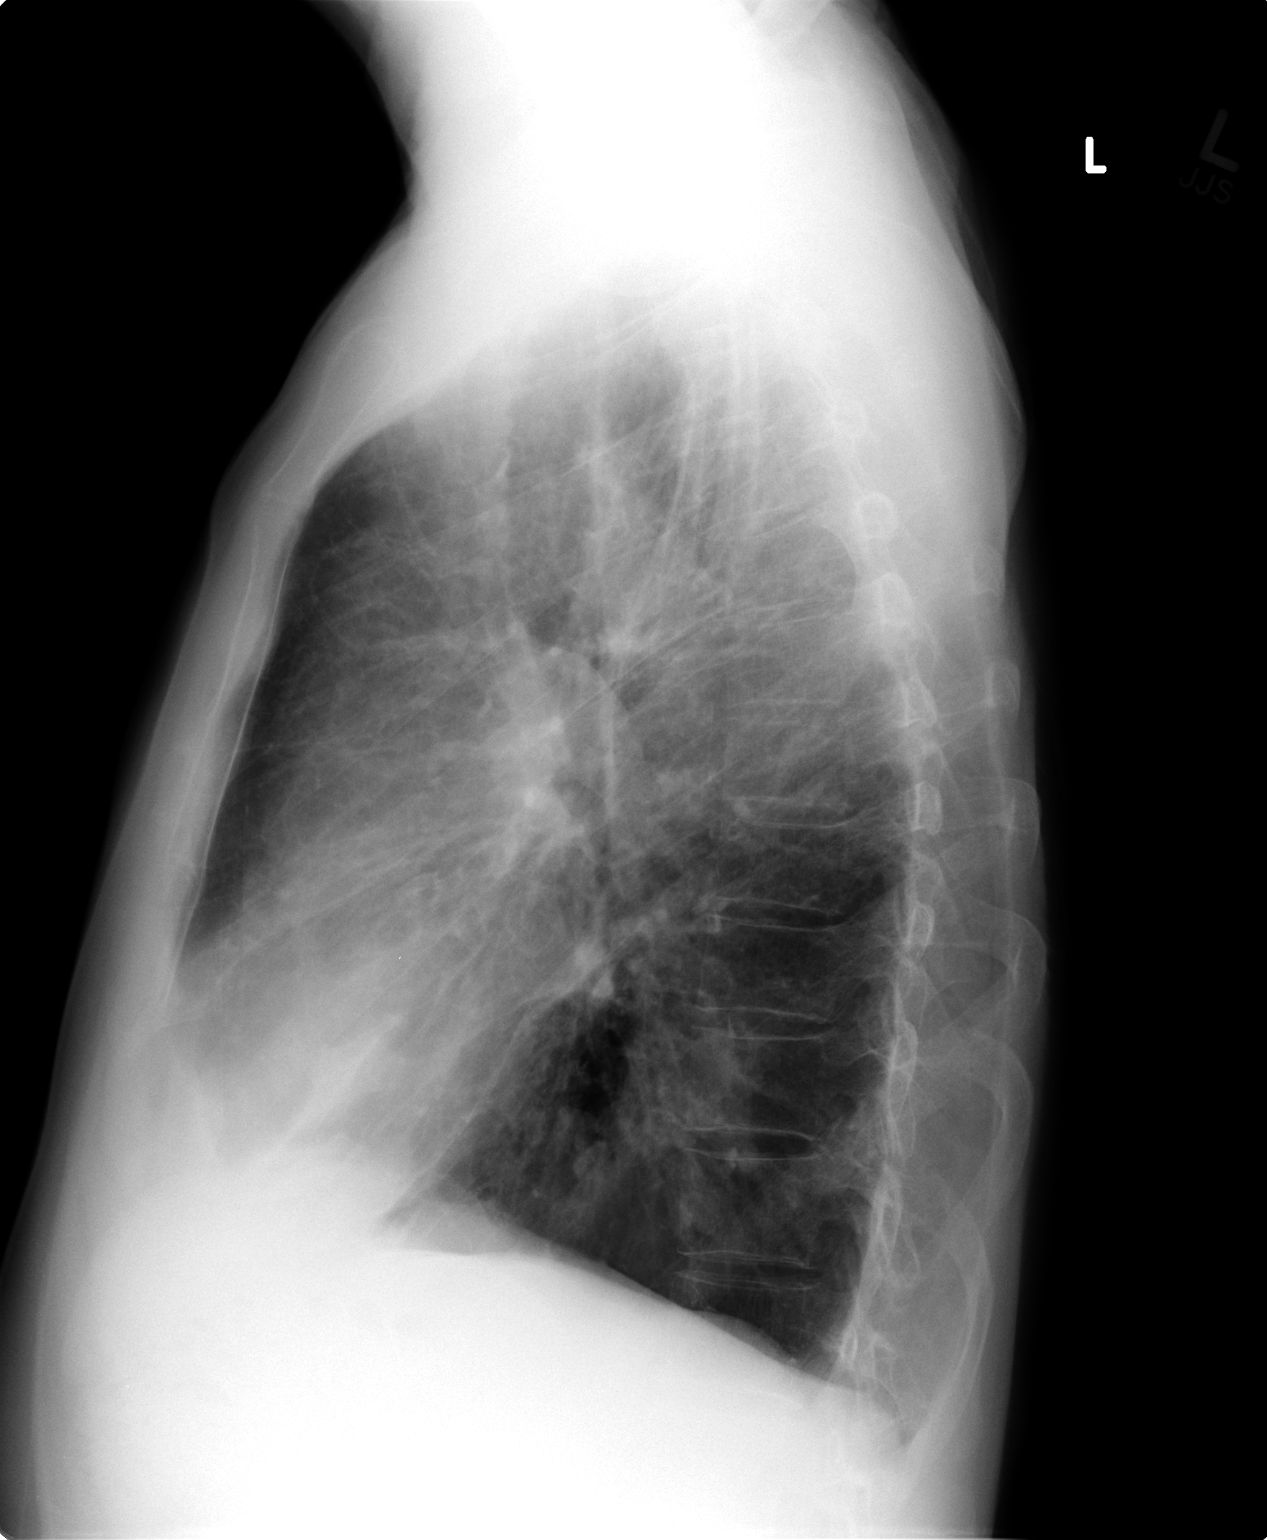

[2 of 2 positions shown; findings below may reference images not displayed]

FINDINGS: Asymmetric increased lung markings on the right are
stable compatible with asymmetric scarring.  The lungs are
hyperinflated with changes of COPD.  No superimposed acute
pneumonia or effusion.  Negative for mass lesion.  Hiatal hernia is
present.
IMPRESSION: COPD with asymmetric scarring on the right is stable.  No
superimposed acute abnormality.

## 2011-11-28 ENCOUNTER — Other Ambulatory Visit (INDEPENDENT_AMBULATORY_CARE_PROVIDER_SITE_OTHER): Payer: Medicare Other

## 2011-11-28 ENCOUNTER — Encounter: Payer: Self-pay | Admitting: Internal Medicine

## 2011-11-28 ENCOUNTER — Ambulatory Visit (INDEPENDENT_AMBULATORY_CARE_PROVIDER_SITE_OTHER): Payer: Medicare Other | Admitting: Internal Medicine

## 2011-11-28 ENCOUNTER — Ambulatory Visit (INDEPENDENT_AMBULATORY_CARE_PROVIDER_SITE_OTHER)
Admission: RE | Admit: 2011-11-28 | Discharge: 2011-11-28 | Disposition: A | Discharge status: Home / Self Care | Payer: Medicare Other | Source: Ambulatory Visit | Attending: Internal Medicine | Admitting: Internal Medicine

## 2011-11-28 VITALS — BP 132/64 | HR 65 | Temp 98.2°F | Ht 67.0 in | Wt 134.0 lb

## 2011-11-28 DIAGNOSIS — M3130 Wegener's granulomatosis without renal involvement: Secondary | ICD-10-CM

## 2011-11-28 LAB — CBC WITH DIFFERENTIAL/PLATELET
Basophils Absolute: 0.1 10*3/uL (ref 0.0–0.1)
Eosinophils Absolute: 0.1 10*3/uL (ref 0.0–0.7)
HCT: 37.6 % — ABNORMAL LOW (ref 39.0–52.0)
Lymphs Abs: 1.8 10*3/uL (ref 0.7–4.0)
MCHC: 33.5 g/dL (ref 30.0–36.0)
Monocytes Absolute: 0.6 10*3/uL (ref 0.1–1.0)
Monocytes Relative: 8.6 % (ref 3.0–12.0)
Platelets: 270 10*3/uL (ref 150.0–400.0)
RDW: 13.9 % (ref 11.5–14.6)

## 2011-11-28 LAB — BASIC METABOLIC PANEL
Calcium: 8.5 mg/dL (ref 8.4–10.5)
GFR: 66.18 mL/min (ref >60.00)
Sodium: 140 mEq/L (ref 135–145)

## 2011-11-28 LAB — SEDIMENTATION RATE: Sed Rate: 24 mm/hr — ABNORMAL HIGH (ref 0–22)

## 2011-11-28 MED ORDER — PREDNISONE (PAK) 10 MG PO TABS: ORAL_TABLET | ORAL | Status: AC

## 2011-11-28 NOTE — Progress Notes (Signed)
Subjective:    Patient ID: Richard [name], male    DOB: 03/26/1936, 76 y.o.   MRN: [id number]  HPI 76  yowm never smoker with dx of Longstanding bronchiectasis then dx with WG 03/2010 > requested establish with Charnay Nazario.   July 08, 2009- Bronchiectasis, chronic bronchits, allergic rhinitis  Had flu vax and has had at least 2 pneumovax.   Admit [facility] dx WG by pos c anca 03/2010, supportive tbbx but not vats   Admit [name] dx spont L Ptx 9/08/15/09 requiring Chest tube but no surgery   05/24/10--NP ov/ med reivew. Since last visit. has been hospitalized 05/12/2010-- 10/10/2011for Right pneumothorax., with underlying Wegener granulomatosis. and . Bronchiectasis. Pt was found to have Right Pneumo. Chest tube was placed. with improvement and removed 10/7. Xray on 10/10 w/ resolved pnuemo. He was continued on cytoxan and bactrim for PCP prophylaxis. rec. Prednisone 20 mg reduce to one half daily  Aspirin 81 mg one daily with breakfast but stop for any bleeding  See Patient Care Coordinator before leaving for cardiology appt > saw Dr [name], ? LHC needed   July 21, 2010 ov sob better no longer needing any 02 at all during day, no hemoptysis , producing occ yellow mucus - reports intermittent noct bilateral shoulder pain better with streching, moving, massage. no assoc nausea or sweats. recd try pred 5 mg daily   August 24, 2010 ov cough w/ yellow phlem, breathing is better, using flutter and med calendar consistently. R chest pain with deep breathing no change since R chest tube removed in 05/2010.   October 13, 2010 --Presents for an acute office visit. Complains of productive cough with yellow mucus, sore throat, rattling in chest, low grade temp x3days. Currently on pred 1/2 every other day . OTC not helping. rec continue qod pred, rx with augmentin x 10 days   11/16/2010 ov all smiles, no change on days of prednisone, minimal discolored sputum.   Try off prednisone completely.  If you feel a lot worse  over the next  week(worse breathing/ coughing/ nausea or loss of appetite) resume prednisone 5 mg one half daily until better then return back one half every other day  Late add:  Needs B12 and RBC Folate next ov  12/16/2010 ov/Kareem Cathey cc no better on 5mg  dosed one half every other day (not the dose rec), still nauseated, still discolored p doxy complete May 7, sob in shower but not as much walking and no longer using 02, struggling with contingency plans. rec Work on inhaler technique:  If short of breath go ahead and use the xopenex hfa 2 puffs every 4 hours  Prednisone 5 mg 4 daily until better, then 2 daily x 5 days, then 1 daily thereafter   12/31/10 ov/[name], exac of bronchiectasis/wheeze no hemoptysis rx with pred burst and levaquin 750 x 5d  01/13/2011 ov/Kendrell Lottman  Cc sob and cough better now tapered prednisone to 5 mg each am using xop once daily around noon no problem with sleep or am exac of cough.   rec  02/11/2011 [name] cc recurrent hemoptysis on top of yellow that usually comes up esp in am x one year but no epistaxis, no cp or arthralgias or increased doe.  rec Levaquin 750 mg x 5 days courses whenever mucus turns bloody, more dark or with high fever.  Stop cytoxan and sulfasoxazole   Don't take aspirin if any active bleeding   03/16/2011 f/u ov/Aliveah Gallant cc no more blood,  Minimal yellow  mucus, back on asa 81 without hemoptysis. No sob rec  Try prednisone 5 mg  One on even and a half on odd   Acute Visit 04/26/11 -[name]  Cc  increased chest congestion, yellow sputum beginning about 1 week ago. He took a levaquin x 5 days (9/11 - 9/15). Has a sore throat, getting better. Nasal drainage and sinus drainage worse over last 3 -4 days. He tells me that he cut grass last Thursday, may have started this. No sick contacts.  rec Please start levaquin daily for another 5 days Continue your current prednisone dosing  Start loratadine 10mg  daily until your next visit    05/27/2011 f/u ov/Odell Fasching on  2.5 mg pred /day  cc still coughing, congested though much less purulent sputum p levaquin and no hemoptysis.  Does note fatigue > baseline  With doe worse x garbage cans to street and back and has not tried the xopenex at all. rec Try using xopenex as needed for short of breath  Only take the loratidine (clariton) if needed for itching sneezing runny nose     07/19/2011 ER follow up 07/15/11 with increased cough, congestion and fever. Dx with acute bronchiectasis and possible influenza. He has started on Levaquin 750mg  x 5 days w/ last day today. Also given Tamiflu but did not fill this. Fevers are decreasing. Cough and congesiton are improved but not gone. Still feels weak.CXR w/o acute changes.  Labs from ER reviewed with elevated WBC 11k, w/ left shift. Neg strep test. No influenza data found.  rec Extend Levaquin daily for 5 days  Mucinex DM Twice daily  As needed  Cough/congestion  Fluids and rest  Tylenol As needed  Fever   08/24/2011 f/u ov/[name] on Prednisone 5 mg one half each am  cc cough/ congestion back to baseline, no limiting sob. rec Try prednisone 5 mg one half pill alternate days to see if any worse respiratory symptoms or nausea, fatigue get worse  Ok to adjust the mucinex dm to take up to 2 every 12 hours as needed  For itching sneezing and runny nose, use the loratidine 10 mg (clariton)            OV 74/20/2013 ACUTE OFFICE VISIT WITH DR RAMASWAMY  - 76 y old male patient of Dr [name] with bronchiectasis, wegener (intolerant to cytoxan and maintained on low dose alternate day prednisone), cachexia (Body mass index is 20.33 kg/(m^2).Marland Kitchen Presents with wife. PAst 3-4 days increased cough, change in color sputum to yellow, wheeze, chest tightness, and fatigue. Denies fever. Today is day 3 of levaquin which has helped cough intensity and sputum volume/color partially but still very fatigued. No improvement in wheeze. He and wife are very concerned that this could worsen to  pneumonia and though better decided to seek acute visit. rec You are probably having an exacerbation or attack of bronchiectasis To figure out if there is pneumonia please have chest xray; will inform you of results in next day or so Please stop levaquin STart omnicef generic 300mg  twice daily x 7 days; call us if expensive Please take Take prednisone 40mg  once daily x 3 days, then 30mg  once daily x 3 days, then 20mg  once daily x 3 days, then prednisone 10mg  once daily  x 3 days and then 5mg  daily x 3 days and then to your baseline regimen of alternating rec You are probably having an exacerbation or attack of bronchiectasis To figure out if there is pneumonia please have chest xray;  will inform you of results in next day or so Please stop levaquin STart omnicef generic 300mg  twice daily x 7 days; call us if expensive Please take Take prednisone 40mg  once daily x 3 days, then 30mg  once daily x 3 days, then 20mg  once daily x 3 days, then prednisone 10mg  once daily  x 3 days and then 5mg  daily x 3 days and then to your baseline regimen of alternating  11/28/2011 f/u ov/Chrysa Rampy cc much better on higher doses of prednisone but worse w/in 5 days of floor of qod regimen with increase cough/ congestion minimally discolored sputum no epistaxis hemoptysis or aches/ pains x vague discomfort r ant chest under breast.  No hematuria  Sleeping ok without nocturnal  or early am exacerbation  of respiratory  c/o's or need for noct saba. Also denies any obvious fluctuation of symptoms with weather or environmental changes or other aggravating or alleviating factors except as outlined above   ROS  At present neg for  any significant sore throat, dysphagia, dental problems, itching, sneezing,  nasal congestion or excess/ purulent secretions, ear ache,   fever, chills, sweats, unintended wt loss, pleuritic or exertional cp, , palpitations, orthopnea pnd or leg swelling.  Also denies presyncope, palpitations, heartburn,  abdominal pain, anorexia, nausea, vomiting, diarrhea  or change in bowel or urinary habits, change in stools or urine, dysuria,hematuria,  rash, arthralgias, visual complaints, headache, numbness weakness or ataxia or problems with walking or coordination. No noted change in mood/affect or memory.                                Objective:   Physical Exam wt   128  04/26/11 >07/08/2011  134>128 07/19/2011 > 08/24/2011  133> 134 11/28/2011  amb thin pleasant wm nad  SKIN: no rash, lesions  NODES: no lymphadenopathy  HEENT: [place]/AT, EOM- WNL, Conjuctivae- clear, PERRLA, TM-WNL, Nose- clear, Throat- clear and wnl, Mallampati III  NECK: Supple w/ fair ROM, JVD- none, normal carotid impulses w/o bruits Thyroid-  CHEST: mild bilateral exp rhonchi  No resp distress HEART: RRR, no m/g/r heard  ABDOMEN: Soft and nl      CXR  11/28/2011 :  Stable asymmetric parenchymal changes without acute or superimposed abnormality.  Labs 11/28/11 ok, esr 24      Assessment & Plan:

## 2011-11-28 NOTE — Patient Instructions (Addendum)
Prednisone 20 mg per day until better, then 10 mg per day x 5 days, and then 5 mg per day.  Please remember to go to the lab and x-ray department downstairs for your tests - we will call you with the results when they are available.    Please schedule a follow up visit in 3 months but call sooner if needed

## 2011-11-29 ENCOUNTER — Telehealth: Payer: Self-pay | Admitting: Internal Medicine

## 2011-11-29 NOTE — Assessment & Plan Note (Signed)
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 > CTX started    - Off all prednisone with baseline esr 31 11/16/2010  > restarted 12/16/2010 with nausea and wt down to 115 but ok sats walking x 3 laps   - Recheck ANCA sent  12/16/2010  >  neg   -Try off cytoxan starting 02/12/2011 >   03/16/11 C-Anca Pos 1:160  He still appears to have a steroid dep problem, not clear whether this is WG or not, but flares on qod regimen as floor  The goal with a chronic steroid dependent illness is always arriving at the lowest effective dose that controls the disease/symptoms and not accepting a set "formula" which is based on statistics or guidelines that don't always take into account patient  variability or the natural hx of the dz in every individual patient, which may well vary over time.  For now therefore I recommend the patient maintain  A ceiling of 20 mg and a floor of 5 mg daily

## 2011-11-29 NOTE — Telephone Encounter (Signed)
Notes Recorded by [name], MD on 11/28/2011 at 5:48 PM Call pt: Reviewed cxr and no acute change so no change in recommendations made at [facility] Notes Recorded by [name], MD on 11/28/2011 at 5:30 PM Call patient : Studies are unremarkable, no change in recs   I spoke with patient about results and he verbalized understanding and had no questions

## 2011-12-04 ENCOUNTER — Other Ambulatory Visit: Payer: Self-pay | Admitting: Family Medicine

## 2011-12-05 NOTE — Telephone Encounter (Signed)
Can we refill this? 

## 2011-12-07 NOTE — Telephone Encounter (Signed)
Pt is calling back checking on status of medication °

## 2011-12-07 NOTE — Telephone Encounter (Signed)
Call in #[phone] with no refills. He needs an OV soon  

## 2012-02-01 ENCOUNTER — Encounter: Payer: Self-pay | Admitting: Family Medicine

## 2012-02-01 ENCOUNTER — Ambulatory Visit (INDEPENDENT_AMBULATORY_CARE_PROVIDER_SITE_OTHER): Payer: Medicare Other | Admitting: Family Medicine

## 2012-02-01 VITALS — BP 110/64 | HR 78 | Temp 98.6°F | Wt 135.0 lb

## 2012-02-01 DIAGNOSIS — K5732 Diverticulitis of large intestine without perforation or abscess without bleeding: Secondary | ICD-10-CM

## 2012-02-01 DIAGNOSIS — K5792 Diverticulitis of intestine, part unspecified, without perforation or abscess without bleeding: Secondary | ICD-10-CM

## 2012-02-01 MED ORDER — DUTASTERIDE 0.5 MG PO CAPS: 0.5000 mg | ORAL_CAPSULE | Freq: Every day | ORAL | Status: DC

## 2012-02-01 MED ORDER — LEVOFLOXACIN 750 MG PO TABS: 750.0000 mg | ORAL_TABLET | Freq: Every day | ORAL | Status: DC

## 2012-02-01 MED ORDER — METRONIDAZOLE 500 MG PO TABS: 500.0000 mg | ORAL_TABLET | Freq: Three times a day (TID) | ORAL | Status: AC

## 2012-02-01 NOTE — Progress Notes (Signed)
  Subjective:    Patient ID: Richard [name], male    DOB: 07/31/1936, 76 y.o.   MRN: [id number]  HPI Here for 4 weeks of intermittent mild lower abdominal pains after he eats a meal. He feels more discomfort when sitting but feels better when he is standing or walking. No change in BMs or urinations. He averages one BM a day. No GERD symptoms, and he still takes Nexium daily. No nausea or fever. He had moderate diverticulosis on his colonoscopy in January.    Review of Systems  Constitutional: Negative.   Respiratory: Negative.   Cardiovascular: Negative.   Gastrointestinal: Positive for abdominal pain. Negative for nausea, vomiting, diarrhea, constipation, blood in stool, abdominal distention and rectal pain.  Genitourinary: Negative.        Objective:   Physical Exam  Constitutional: He appears well-developed and well-nourished. No distress.  Cardiovascular: Normal rate, regular rhythm, normal heart sounds and intact distal pulses.   Pulmonary/Chest: Effort normal. No respiratory distress. He has no wheezes. He has no rales.       Soft scattered rhonchi   Abdominal: Soft. Bowel sounds are normal. He exhibits no distension and no mass. There is no rebound and no guarding.       Slightly tender in the LLQ          Assessment & Plan:  Probable mild diverticulitis. Treat with 10 days of Levaquin and Flagyl. Start taking Miralax daily. Recheck prn

## 2012-02-16 ENCOUNTER — Telehealth: Payer: Self-pay | Admitting: Internal Medicine

## 2012-02-16 ENCOUNTER — Telehealth: Payer: Self-pay | Admitting: Family Medicine

## 2012-02-16 NOTE — Telephone Encounter (Signed)
Caller: [name]/Patient; PCP: Nelwyn Salisbury.; CB#: [phone]; ; Call regarding medication question.  Hx Bronchitis; On Levaquin by Dr. Sharee Pimple 02/15/12; Increased urination since then.   Dr. Laneta Simmers office advised him to call Dr. Clent Ridges as he has questions related to possible interactions with Flomax and Avadart.  Note to office per Medication Questions protocol.  Pharmacy confirmed in contact information.

## 2012-02-16 NOTE — Telephone Encounter (Signed)
I spoke with pt and he states he feels bad today. He is coughing up yellow phlem, chest congestion, and chest hurts from coughing. Pt was given levaquin by his pcp for this. I advised pt he has to give the Levaquin time to start working. i advised pt if he was not any better in a couple days to call back. He voiced his understanding

## 2012-02-17 ENCOUNTER — Encounter: Payer: Self-pay | Admitting: Family Medicine

## 2012-02-17 ENCOUNTER — Ambulatory Visit (INDEPENDENT_AMBULATORY_CARE_PROVIDER_SITE_OTHER): Payer: Medicare Other | Admitting: Family Medicine

## 2012-02-17 VITALS — BP 108/70 | HR 106 | Temp 98.8°F | Wt 132.0 lb

## 2012-02-17 DIAGNOSIS — J40 Bronchitis, not specified as acute or chronic: Secondary | ICD-10-CM

## 2012-02-17 IMAGING — CR DG CHEST 2V
2 series · 2 of 2 positions shown · non-contrast
Comparison: Multiple prior examinations most recent 04/13/2011.

CLINICAL DATA: Wegener's granulomatosis. Shortness of breath.
Nonsmoker.

CHEST - 2 VIEW

[view not recorded (1 of 2)]
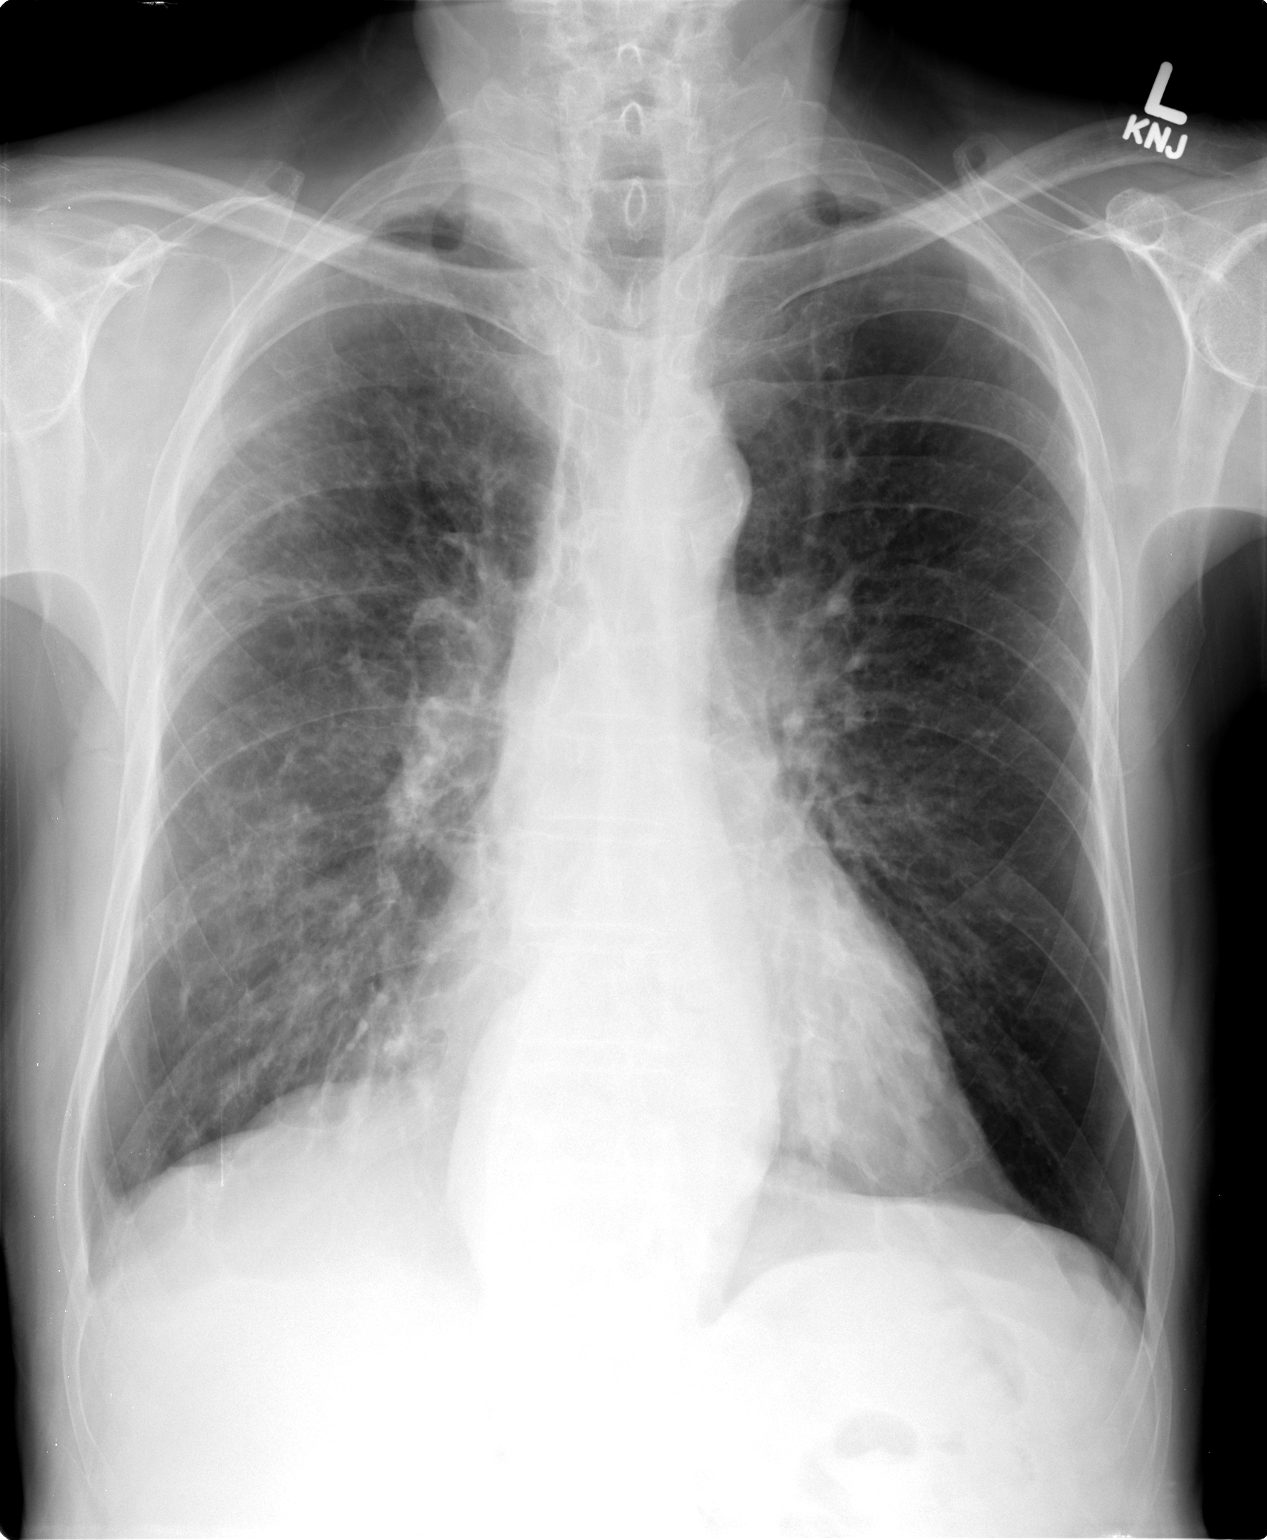

[view not recorded (2 of 2)]
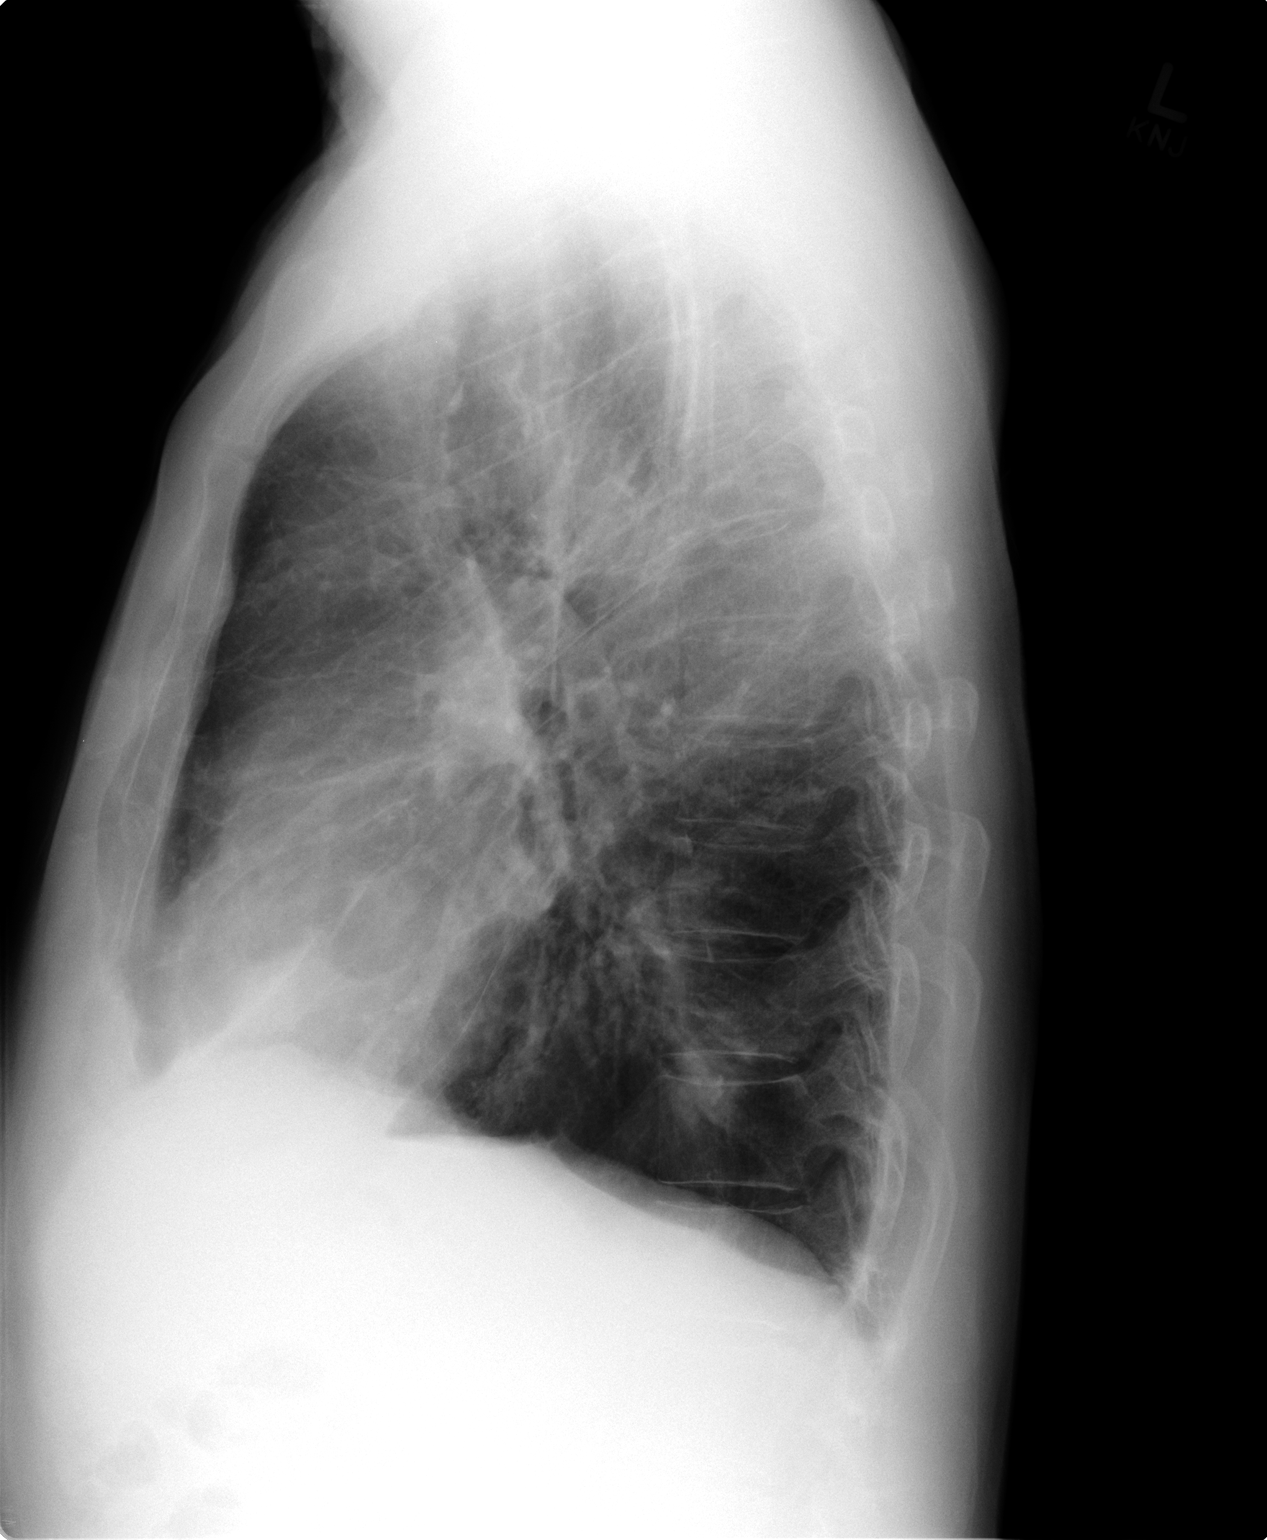

[2 of 2 positions shown; findings below may reference images not displayed]

FINDINGS: Diffuse asymmetric chronic lung changes greater on the
right appear stable (some of these areas may be cavitary).

Fullness of the hilar regions stable (may represent combination of
pulmonary vascular prominence and adenopathy).

On the lateral view, density projecting over the heart border
appears similar.

No definitive findings of underlying mass or infiltrate.
Evaluation for subtle abnormality limited given the degree of
chronic changes.

Calcified tortuous aorta.  Hiatal hernia. Heart size top normal.
IMPRESSION: No significant change in appearance of chronic findings as
discussed above.

## 2012-02-17 MED ORDER — AMOXICILLIN-POT CLAVULANATE 875-125 MG PO TABS: 1.0000 | ORAL_TABLET | Freq: Two times a day (BID) | ORAL | Status: DC

## 2012-02-17 MED ORDER — CEFTRIAXONE SODIUM 1 G IJ SOLR
1.0000 g | Freq: Once | INTRAMUSCULAR | Status: AC
  Administered 2012-02-17: 1 g via INTRAMUSCULAR

## 2012-02-17 NOTE — Progress Notes (Signed)
  Subjective:    Patient ID: Richard [name], male    DOB: 11-13-1935, 76 y.o.   MRN: [id number]  HPI Here for 4 days of fever to 102 degrees, chest tightness, and coughing up yellow sputum. He has been instructed by [name] office to start taking Levaquin whenever he thinks he is getting a chest infection, so he did start talking this 4 days ago. However it does not seem to be working well. He admits to drinking very little fluids. He vomited once this am. He was seen here on 02-01-12 for a lower abdominal pain that was felt to be diverticulitis, and he took a 10 day course of Levaquin and Flagyl for that. Now his abdomen feels much better.    Review of Systems  Constitutional: Positive for fever.  Eyes: Negative.   Respiratory: Positive for cough and chest tightness. Negative for shortness of breath and wheezing.   Cardiovascular: Negative.   Gastrointestinal: Negative.   Genitourinary: Negative.        Objective:   Physical Exam  Constitutional: He appears well-developed and well-nourished. No distress.  Neck: No thyromegaly present.  Cardiovascular: Normal rate, regular rhythm, normal heart sounds and intact distal pulses.   Pulmonary/Chest: Effort normal. He has no rales.       Scattered rhonchi and wheezes  Lymphadenopathy:    He has no cervical adenopathy.          Assessment & Plan:  I think Levaquin is losing its effectiveness for him since he takes it so frequently. We will stop this and substitute Augmentin for 10 days. Given a shot of Rocephin today. He needs to drink more fluids .

## 2012-02-17 NOTE — Telephone Encounter (Signed)
I spoke with pt and he is going to come in today to see Dr. Clent Ridges.

## 2012-02-17 NOTE — Addendum Note (Signed)
Addended by: Aniceto Boss A on: 02/17/2012 03:14 PM   Modules accepted: Orders

## 2012-02-17 NOTE — Telephone Encounter (Signed)
Since Dr. [name] prescribed this medication, he needs to address any possible side effects

## 2012-02-24 IMAGING — CR DG CHEST 2V
2 series · 2 of 2 positions shown · non-contrast
Comparison: 07/08/2011

CLINICAL DATA: Cough, weakness, fever, lower extremity pain.  COPD.
Sick for 2 days.

CHEST - 2 VIEW

[w chest pa]
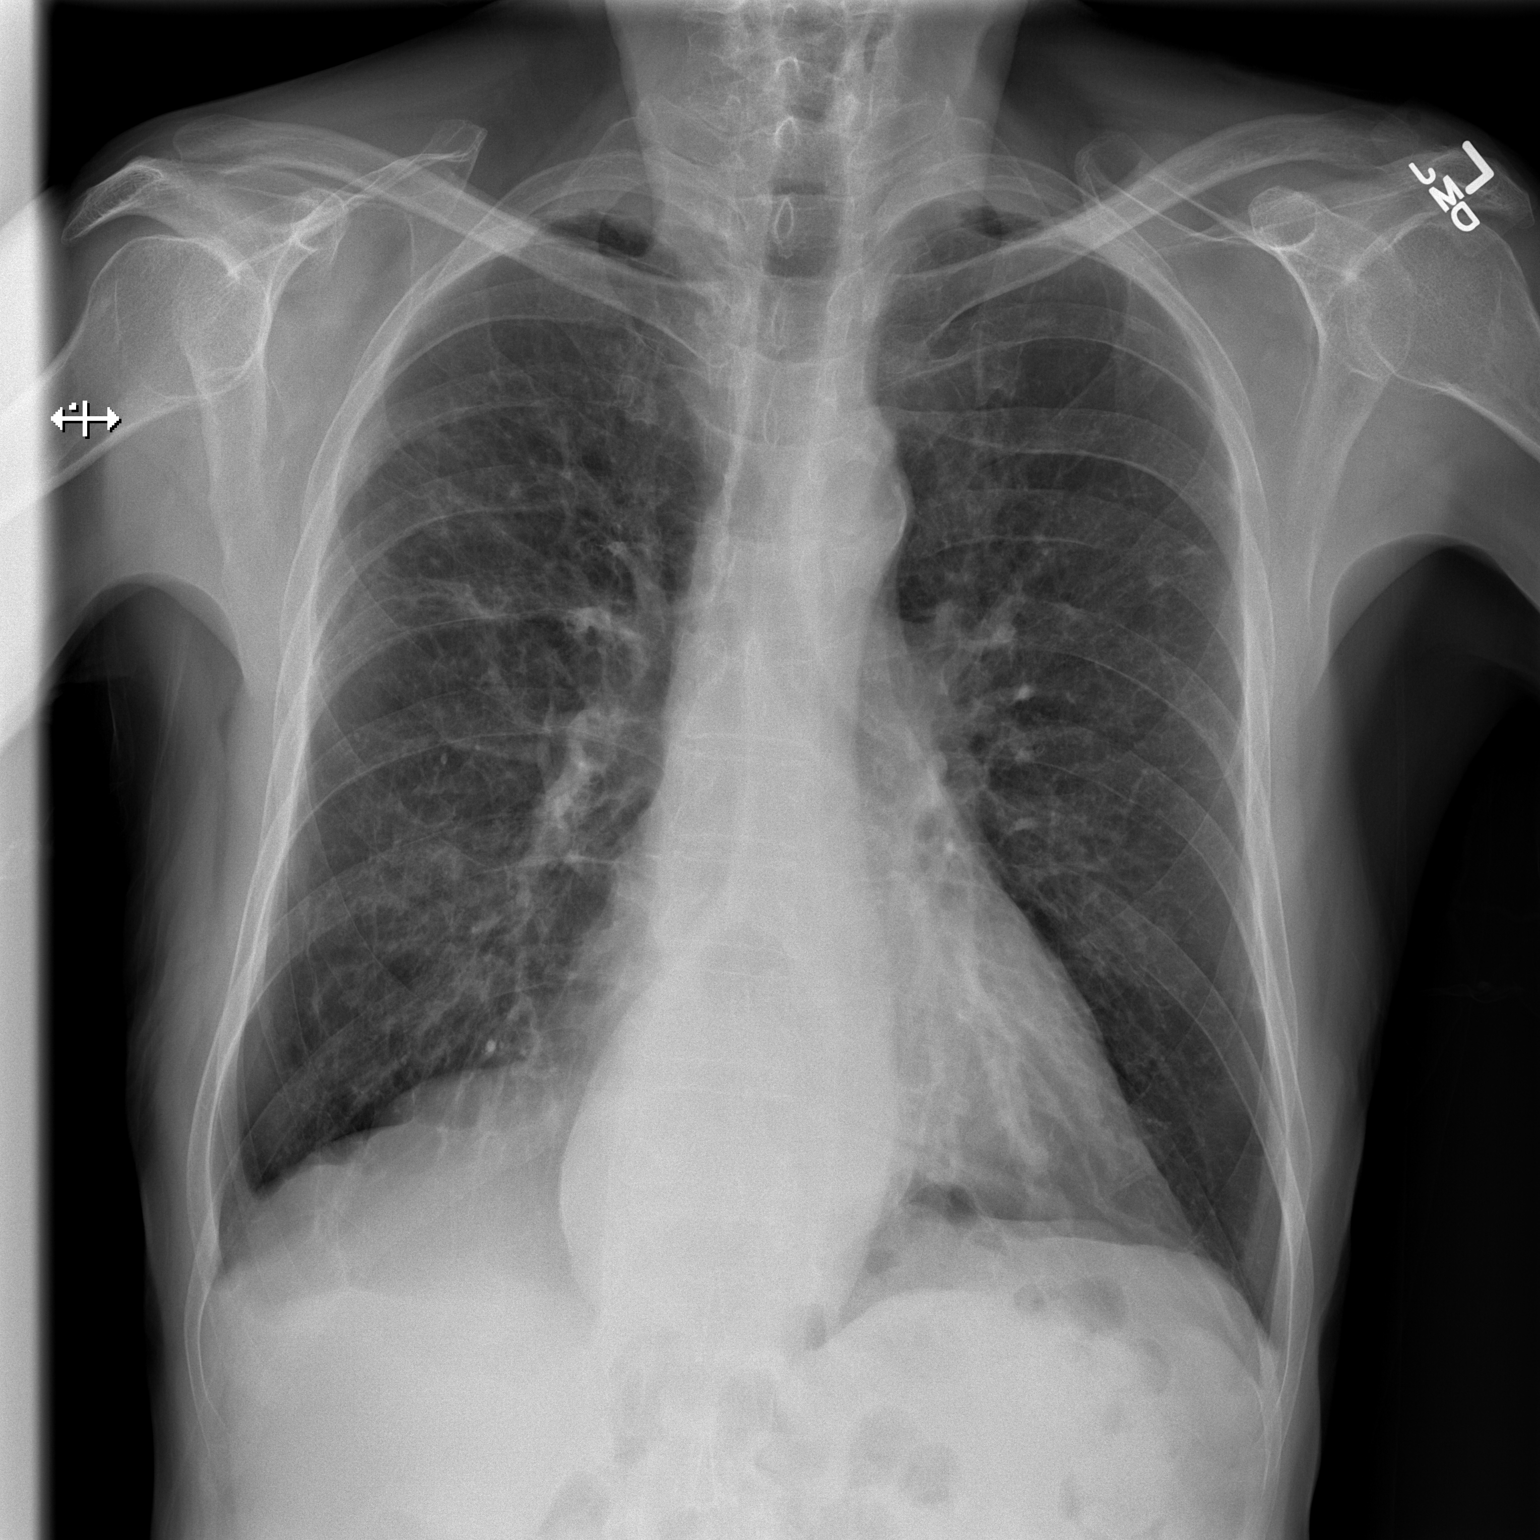

[w chest lat]
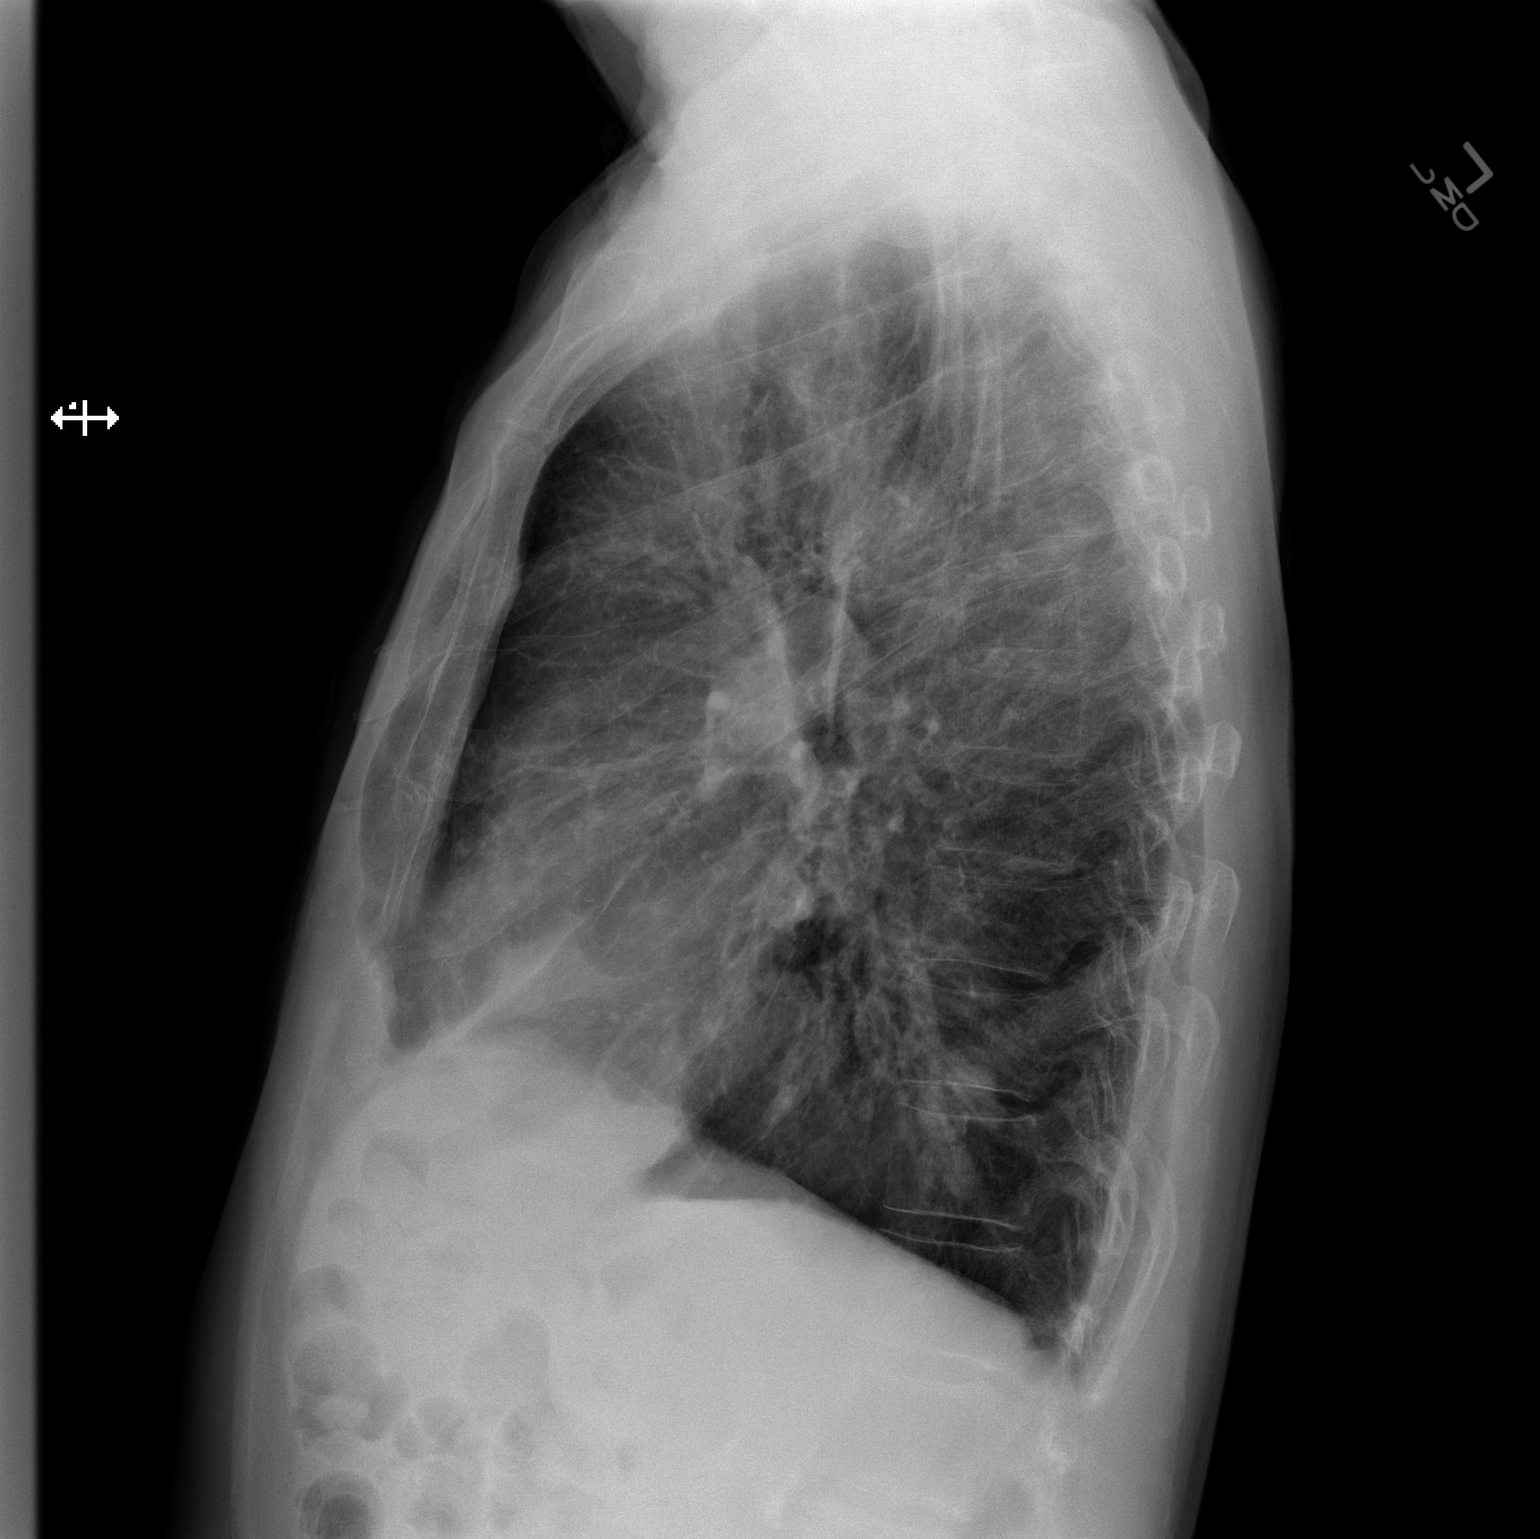

[2 of 2 positions shown; findings below may reference images not displayed]

FINDINGS: Normal heart size and pulmonary vascularity.
Emphysematous changes in the lungs with scattered fibrosis.
Central bronchiectasis with peribronchial thickening suggesting
chronic bronchitis.  Nodular changes in the left lung base
consistent with bronchiectasis and mucus plugging.  No focal
airspace consolidation.  No blunting of costophrenic angles.  No
pneumothorax.  Calcification of the aorta.  Esophageal hiatal
hernia behind the heart.  Stable appearance of the chest since
previous study.
IMPRESSION: No evidence of active pulmonary disease.  Stable chronic changes of
emphysema, chronic bronchitis, bronchiectasis, mucus plugging, and
fibrosis.  Esophageal hiatal hernia behind the heart.

## 2012-02-29 ENCOUNTER — Encounter: Payer: Self-pay | Admitting: Internal Medicine

## 2012-02-29 ENCOUNTER — Ambulatory Visit (INDEPENDENT_AMBULATORY_CARE_PROVIDER_SITE_OTHER): Payer: Medicare Other | Admitting: Internal Medicine

## 2012-02-29 VITALS — BP 110/70 | HR 79 | Temp 98.2°F | Ht 67.0 in | Wt 136.4 lb

## 2012-02-29 DIAGNOSIS — M3130 Wegener's granulomatosis without renal involvement: Secondary | ICD-10-CM

## 2012-02-29 DIAGNOSIS — J479 Bronchiectasis, uncomplicated: Secondary | ICD-10-CM

## 2012-02-29 MED ORDER — PREDNISONE 5 MG PO TABS: 5.0000 mg | ORAL_TABLET | Freq: Every day | ORAL | Status: DC

## 2012-02-29 MED ORDER — BUDESONIDE-FORMOTEROL FUMARATE 160-4.5 MCG/ACT IN AERO: 2.0000 | INHALATION_SPRAY | Freq: Two times a day (BID) | RESPIRATORY_TRACT | Status: DC

## 2012-02-29 NOTE — Progress Notes (Signed)
Subjective:    Patient ID: Richard [name], male    DOB: 03/26/1936, 76 y.o.   MRN: [id number]  HPI 71  yowm never smoker with dx of Longstanding bronchiectasis then dx with WG 03/2010 > requested establish with Richard [name].   July 08, 2009- Bronchiectasis, chronic bronchits, allergic rhinitis  Had flu vax and has had at least 2 pneumovax.   Admit [facility] dx WG by pos c anca 03/2010, supportive tbbx but not vats   Admit [name] dx spont L Ptx 9/08/15/09 requiring Chest tube but no surgery   05/24/10--NP ov/ med reivew. Since last visit. has been hospitalized 05/12/2010-- 10/10/2011for Right pneumothorax., with underlying Wegener granulomatosis. and . Bronchiectasis. Pt was found to have Right Pneumo. Chest tube was placed. with improvement and removed 10/7. Xray on 10/10 w/ resolved pnuemo. He was continued on cytoxan and bactrim for PCP prophylaxis. rec. Prednisone 20 mg reduce to one half daily  Aspirin 81 mg one daily with breakfast but stop for any bleeding  See Patient Care Coordinator before leaving for cardiology appt > saw Dr [name], ? LHC needed   July 21, 2010 ov sob better no longer needing any 02 at all during day, no hemoptysis , producing occ yellow mucus - reports intermittent noct bilateral shoulder pain better with streching, moving, massage. no assoc nausea or sweats. recd try pred 5 mg daily   August 24, 2010 ov cough w/ yellow phlem, breathing is better, using flutter and med calendar consistently. R chest pain with deep breathing no change since R chest tube removed in 05/2010.   October 13, 2010 --Presents for an acute office visit. Complains of productive cough with yellow mucus, sore throat, rattling in chest, low grade temp x3days. Currently on pred 1/2 every other day . OTC not helping. rec continue qod pred, rx with augmentin x 10 days   11/16/2010 ov all smiles, no change on days of prednisone, minimal discolored sputum.   Try off prednisone completely.  If you feel a lot worse  over the next  week(worse breathing/ coughing/ nausea or loss of appetite) resume prednisone 5 mg one half daily until better then return back one half every other day  Late add:  Needs B12 and RBC Folate next ov  12/16/2010 ov/Richard [name] cc no better on 5mg  dosed one half every other day (not the dose rec), still nauseated, still discolored p doxy complete May 7, sob in shower but not as much walking and no longer using 02, struggling with contingency plans. rec Work on inhaler technique:  If short of breath go ahead and use the xopenex hfa 2 puffs every 4 hours  Prednisone 5 mg 4 daily until better, then 2 daily x 5 days, then 1 daily thereafter   12/31/10 ov/[name], exac of bronchiectasis/wheeze no hemoptysis rx with pred burst and levaquin 750 x 5d  01/13/2011 ov/[name]  Cc sob and cough better now tapered prednisone to 5 mg each am using xop once daily around noon no problem with sleep or am exac of cough.   rec  02/11/2011 [name] cc recurrent hemoptysis on top of yellow that usually comes up esp in am x one year but no epistaxis, no cp or arthralgias or increased doe.  rec Levaquin 750 mg x 5 days courses whenever mucus turns bloody, more dark or with high fever.  Stop cytoxan and sulfasoxazole   Don't take aspirin if any active bleeding   03/16/2011 f/u ov/[name] cc no more blood,  Minimal yellow  mucus, back on asa 81 without hemoptysis. No sob rec  Try prednisone 5 mg  One on even and a half on odd   Acute Visit 04/26/11 -[name]  Cc  increased chest congestion, yellow sputum beginning about 1 week ago. He took a levaquin x 5 days (9/11 - 9/15). Has a sore throat, getting better. Nasal drainage and sinus drainage worse over last 3 -4 days. He tells me that he cut grass last Thursday, may have started this. No sick contacts.  rec Please start levaquin daily for another 5 days Continue your current prednisone dosing  Start loratadine 10mg  daily until your next visit    05/27/2011 f/u ov/[name] on  2.5 mg pred /day  cc still coughing, congested though much less purulent sputum p levaquin and no hemoptysis.  Does note fatigue > baseline  With doe worse x garbage cans to street and back and has not tried the xopenex at all. rec Try using xopenex as needed for short of breath  Only take the loratidine (clariton) if needed for itching sneezing runny nose     07/19/2011 ER follow up 07/15/11 with increased cough, congestion and fever. Dx with acute bronchiectasis and possible influenza. He has started on Levaquin 750mg  x 5 days w/ last day today. Also given Tamiflu but did not fill this. Fevers are decreasing. Cough and congesiton are improved but not gone. Still feels weak.CXR w/o acute changes.  Labs from ER reviewed with elevated WBC 11k, w/ left shift. Neg strep test. No influenza data found.  rec Extend Levaquin daily for 5 days  Mucinex DM Twice daily  As needed  Cough/congestion  Fluids and rest  Tylenol As needed  Fever   08/24/2011 f/u ov/[name] on Prednisone 5 mg one half each am  cc cough/ congestion back to baseline, no limiting sob. rec Try prednisone 5 mg one half pill alternate days to see if any worse respiratory symptoms or nausea, fatigue get worse  Ok to adjust the mucinex dm to take up to 2 every 12 hours as needed  For itching sneezing and runny nose, use the loratidine 10 mg (clariton)            OV 09/28/2011 ACUTE OFFICE VISIT WITH DR RAMASWAMY  - 71 y old male patient of Dr [name] with bronchiectasis, wegener (intolerant to cytoxan and maintained on low dose alternate day prednisone), cachexia (Body mass index is 20.33 kg/(m^2).Marland Kitchen Presents with wife. PAst 3-4 days increased cough, change in color sputum to yellow, wheeze, chest tightness, and fatigue. Denies fever. Today is day 3 of levaquin which has helped cough intensity and sputum volume/color partially but still very fatigued. No improvement in wheeze. He and wife are very concerned that this could worsen to  pneumonia and though better decided to seek acute visit. rec You are probably having an exacerbation or attack of bronchiectasis To figure out if there is pneumonia please have chest xray; will inform you of results in next day or so Please stop levaquin STart omnicef generic 300mg  twice daily x 7 days; call us if expensive Please take Take prednisone 40mg  once daily x 3 days, then 30mg  once daily x 3 days, then 20mg  once daily x 3 days, then prednisone 10mg  once daily  x 3 days and then 5mg  daily x 3 days and then to your baseline regimen of alternating rec You are probably having an exacerbation or attack of bronchiectasis To figure out if there is pneumonia please have chest xray;  will inform you of results in next day or so Please stop levaquin STart omnicef generic 300mg  twice daily x 7 days; call us if expensive Please take Take prednisone 40mg  once daily x 3 days, then 30mg  once daily x 3 days, then 20mg  once daily x 3 days, then prednisone 10mg  once daily  x 3 days and then 5mg  daily x 3 days and then to your baseline regimen of alternating  11/28/2011 f/u ov/Amory Zbikowski cc much better on higher doses of prednisone but worse w/in 5 days of floor of qod regimen with increase cough/ congestion minimally discolored sputum no epistaxis hemoptysis or aches/ pains x vague discomfort r ant chest under breast.  No hematuria rec Prednisone 20 mg per day until better, then 10 mg per day x 5 days, and then 5 mg per day. Please schedule a follow up visit in 3 months but call sooner if needed     02/29/2012 f/u ov/Weda Baumgarner cc better p last flare rx augmentin by Dr Clent Ridges, min yellow mucus, no blood, still on prednisone @ 5 mg per day no change doe, no sinus compliants  Sleeping ok without nocturnal  or early am exacerbation  of respiratory  c/o's or need for noct saba. Also denies any obvious fluctuation of symptoms with weather or environmental changes or other aggravating or alleviating factors except as  outlined above   ROS  At present neg for  any significant sore throat, dysphagia, dental problems, itching, sneezing,  nasal congestion or excess/ purulent secretions, ear ache,   fever, chills, sweats, unintended wt loss, pleuritic or exertional cp, , palpitations, orthopnea pnd or leg swelling.  Also denies presyncope, palpitations, heartburn, abdominal pain, anorexia, nausea, vomiting, diarrhea  or change in bowel or urinary habits, change in stools or urine, dysuria,hematuria,  rash, arthralgias, visual complaints, headache, numbness weakness or ataxia or problems with walking or coordination. No noted change in mood/affect or memory       Objective:   Physical Exam wt   128  04/26/11 >07/08/2011  134>128 07/19/2011 >  134 11/28/2011 > 02/29/2012 136 amb thin pleasant wm nad  SKIN: no rash, lesions  NODES: no lymphadenopathy  HEENT: [place]/AT, EOM- WNL, Conjuctivae- clear, PERRLA, TM-WNL, Nose- clear, Throat- clear and wnl, Mallampati III  NECK: Supple w/ fair ROM, JVD- none, normal carotid impulses w/o bruits Thyroid-  CHEST: mild bilateral exp rhonchi  No resp distress HEART: RRR, no m/g/r heard  ABDOMEN: Soft and nl            Assessment & Plan:

## 2012-02-29 NOTE — Assessment & Plan Note (Signed)
-   HFA 90% p coaching 12/16/2010      - PFT's 06/21/11   FEV1  1.60 (61%) ratio 48 and 10% better p B2,  DLCO 83%     Adequate control on present rx, reviewed

## 2012-02-29 NOTE — Patient Instructions (Addendum)
Ok to try taking prednisone just on even days to see if it makes any difference, if not leave the dose there  Please schedule a follow up visit in 3 months but call sooner if needed

## 2012-02-29 NOTE — Assessment & Plan Note (Signed)
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 > CTX started    - Off all prednisone with baseline esr 31 11/16/2010  > restarted 12/16/2010 with nausea and wt down to 115 but ok sats walking x 3 laps   - Recheck ANCA sent  12/16/2010  >  neg   -Try off cytoxan starting 02/12/2011 >   03/16/11 C-Anca Pos 1:160  No evidence of dz activity clinically > try pred 5 mg qod.

## 2012-03-10 ENCOUNTER — Other Ambulatory Visit: Payer: Self-pay | Admitting: Family Medicine

## 2012-03-25 ENCOUNTER — Other Ambulatory Visit: Payer: Self-pay | Admitting: Family Medicine

## 2012-04-26 ENCOUNTER — Encounter: Payer: Self-pay | Admitting: Family Medicine

## 2012-04-26 ENCOUNTER — Ambulatory Visit (INDEPENDENT_AMBULATORY_CARE_PROVIDER_SITE_OTHER): Payer: Medicare Other | Admitting: Family Medicine

## 2012-04-26 VITALS — BP 112/66 | HR 77 | Temp 98.4°F | Wt 135.0 lb

## 2012-04-26 DIAGNOSIS — J40 Bronchitis, not specified as acute or chronic: Secondary | ICD-10-CM

## 2012-04-26 DIAGNOSIS — M542 Cervicalgia: Secondary | ICD-10-CM

## 2012-04-26 MED ORDER — AMOXICILLIN-POT CLAVULANATE 875-125 MG PO TABS: 1.0000 | ORAL_TABLET | Freq: Two times a day (BID) | ORAL | Status: DC

## 2012-04-26 NOTE — Progress Notes (Signed)
  Subjective:    Patient ID: Richard [name], male    DOB: [birth year], 76 y.o.   MRN: [id number]  HPI Here for 2 things. First he has had stiffness and pain in the right neck and shoulder area. No pain in the arm. Pains shoot up the back of his head. Using heat and a sports cream. Second, he has had a deep dry cough for 4 days. No fever.    Review of Systems  Constitutional: Negative.   HENT: Negative.   Eyes: Negative.   Respiratory: Positive for cough. Negative for shortness of breath and wheezing.   Cardiovascular: Negative.   Musculoskeletal: Positive for back pain.       Objective:   Physical Exam  Constitutional: He appears well-developed and well-nourished. No distress.  HENT:  Right Ear: External ear normal.  Left Ear: External ear normal.  Nose: Nose normal.  Mouth/Throat: Oropharynx is clear and moist.  Eyes: Conjunctivae normal are normal.  Neck: No thyromegaly present.       Tender in the right posterior neck and into the upper right trapezius, reduced ROM  Lymphadenopathy:    He has no cervical adenopathy.          Assessment & Plan:  Treat the bronchitis with Augmentin. Try 800 mg of Ibuprofen tid for the neck pain. Recheck prn

## 2012-05-09 IMAGING — CR DG CHEST 2V
2 series · 2 of 2 positions shown · non-contrast
Comparison: 07/15/2011

CLINICAL DATA: Bronchiectasis , pneumonia

CHEST - 2 VIEW

[view not recorded (1 of 2)]
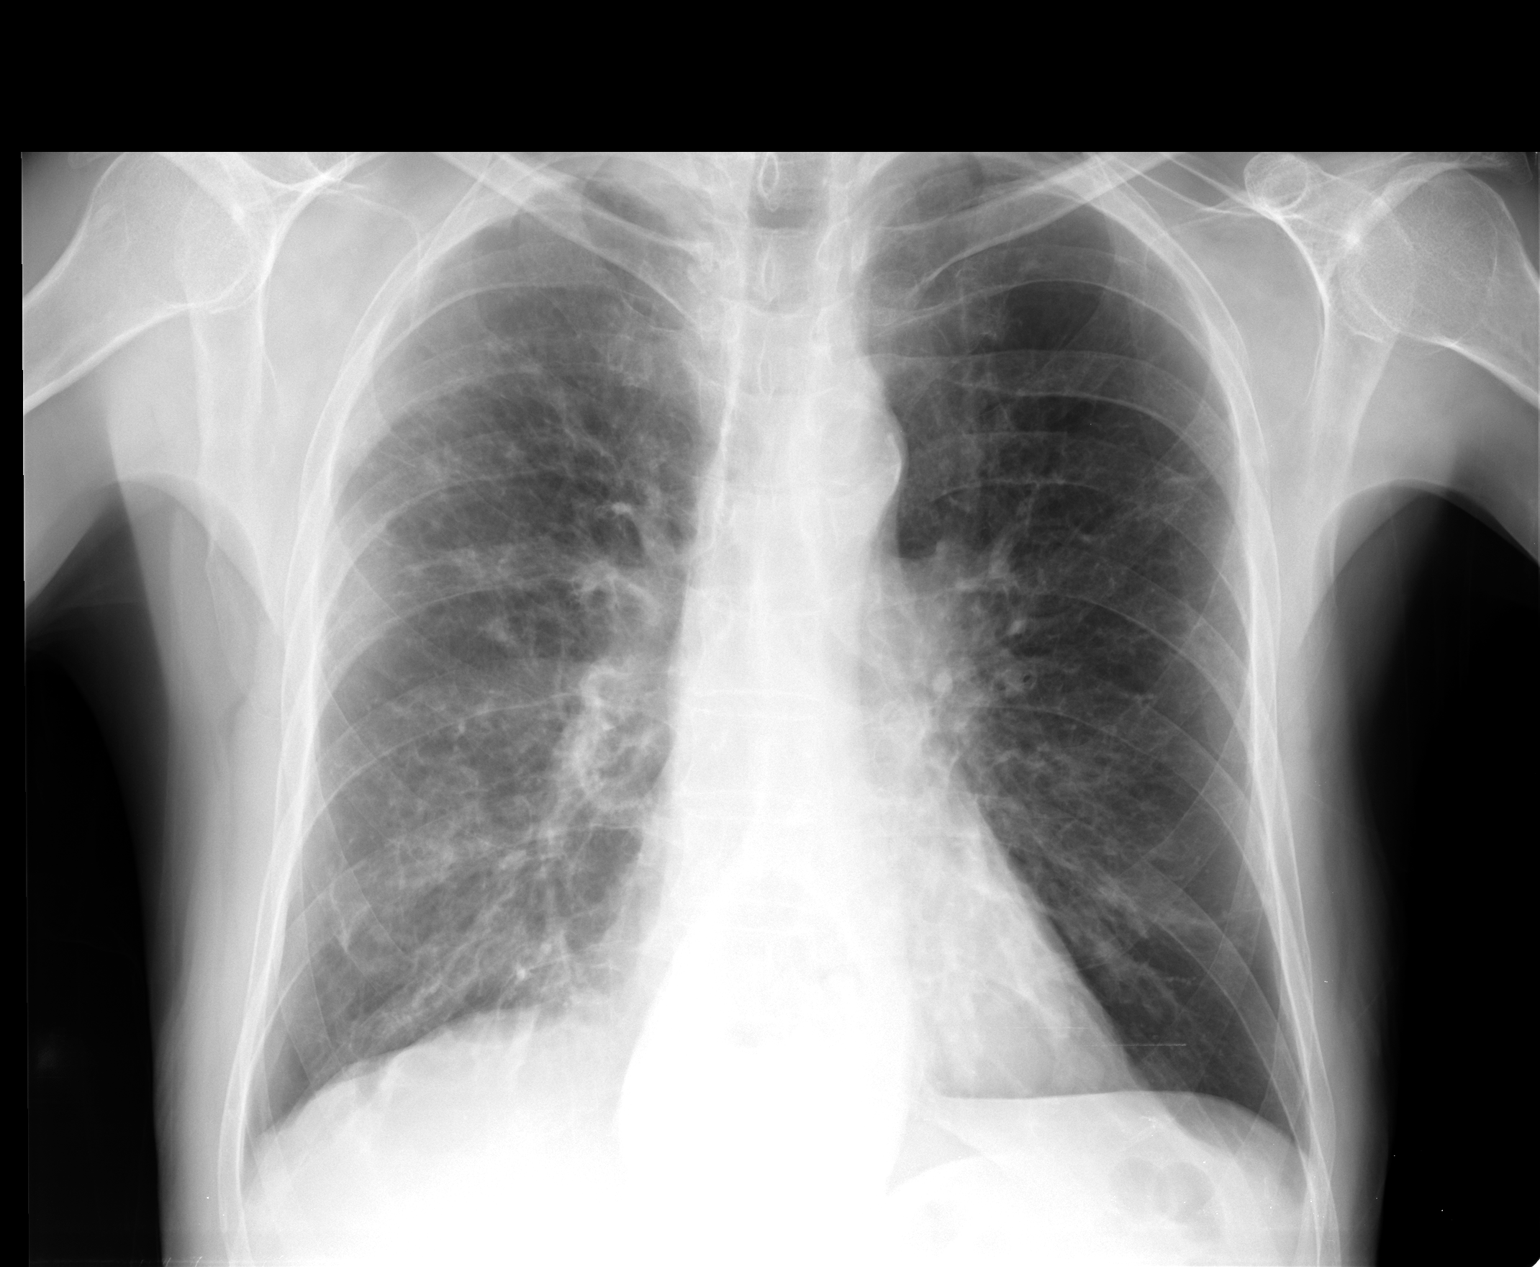

[view not recorded (2 of 2)]
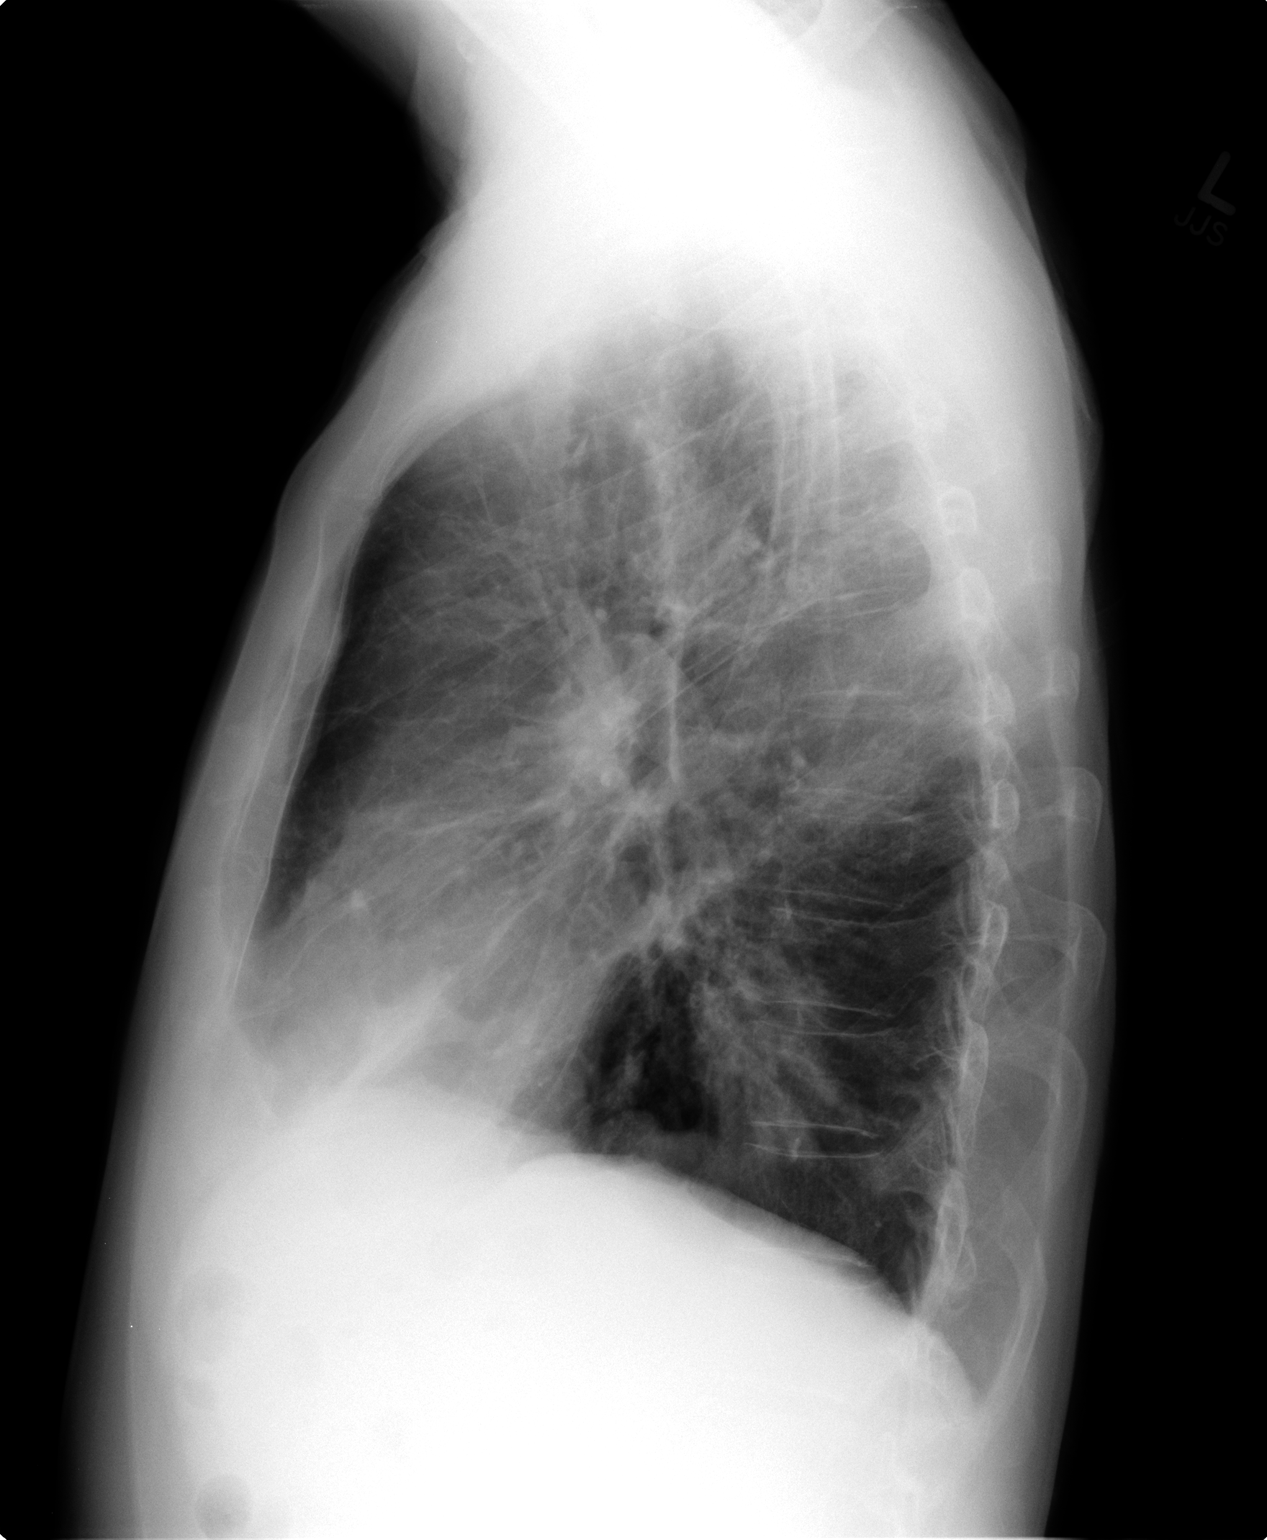

[2 of 2 positions shown; findings below may reference images not displayed]

FINDINGS: Cardiomediastinal silhouette is stable.  Stable
bronchiectasis and chronic interstitial prominence.  Stable hiatal
hernia.  There is a slight increase of patchy streaky focal opacity
in the right perihilar region and right upper lobe.  Mucous
plugging or early patchy alveolar infiltrates cannot be excluded.
Clinical correlation is necessary.
IMPRESSION: Stable bronchiectasis and chronic interstitial prominence.  Stable
hiatal hernia.  There is a slight increase of patchy streaky focal
opacity in the right perihilar region and right upper lobe.  Mucous
plugging or early patchy alveolar infiltrates cannot be excluded.
Clinical correlation is necessary.Follow up examination is
recommended.

## 2012-05-16 ENCOUNTER — Ambulatory Visit (INDEPENDENT_AMBULATORY_CARE_PROVIDER_SITE_OTHER): Payer: Medicare Other | Admitting: Family Medicine

## 2012-05-16 ENCOUNTER — Encounter: Payer: Self-pay | Admitting: Family Medicine

## 2012-05-16 VITALS — BP 110/70 | HR 82 | Temp 98.5°F | Wt 132.0 lb

## 2012-05-16 DIAGNOSIS — N390 Urinary tract infection, site not specified: Secondary | ICD-10-CM

## 2012-05-16 LAB — POCT URINALYSIS DIPSTICK
Glucose, UA: NEGATIVE
Ketones, UA: NEGATIVE
Spec Grav, UA: 1.015
Urobilinogen, UA: 0.2

## 2012-05-16 MED ORDER — LEVOFLOXACIN 750 MG PO TABS: 750.0000 mg | ORAL_TABLET | Freq: Every day | ORAL | Status: DC

## 2012-05-16 NOTE — Progress Notes (Signed)
  Subjective:    Patient ID: Richard [name], male    DOB: 10/04/1935, 76 y.o.   MRN: [id number]  HPI Here for 4 days of urinary urgency and frequency, also some suprapubic pain. No nausea or fever or back pain. He has taken 4 doses of Levaquin (which he keeps at home), and today he feels back to normal.    Review of Systems  Constitutional: Negative.   Gastrointestinal: Negative.   Genitourinary: Positive for urgency and frequency. Negative for dysuria, hematuria, flank pain and difficulty urinating.       Objective:   Physical Exam  Constitutional: He appears well-developed and well-nourished.  Abdominal: Soft. Bowel sounds are normal. He exhibits no distension and no mass. There is no tenderness. There is no rebound and no guarding.          Assessment & Plan:  Partially treated UTI. Given enough Levaquin to complete a 14 days course. Drink plenty of water. Recheck prn

## 2012-05-16 NOTE — Addendum Note (Signed)
Addended by: Aniceto Boss A on: 05/16/2012 01:01 PM   Modules accepted: Orders

## 2012-05-23 ENCOUNTER — Telehealth: Payer: Self-pay | Admitting: Family Medicine

## 2012-05-23 NOTE — Telephone Encounter (Signed)
Caller: [name]/Patient; Patient Name: Richard [name]; PCP: [facility]); Best Callback Phone Number: [phone].  Follow up from starting Levaquin for Kidney infection on 10-9.  Pt wanting to know how many days he is to take Levaquin. Per [facility], Dr Clent Ridges gave enough Levaquin to complete a 14 days course.  Pt's symptoms are still lingering but better when seen on 10-9. Advised Pt to drink plenty of water and to call back if symptoms didn't improve after 14 day course.  Pt verbalized understanding.

## 2012-05-31 ENCOUNTER — Other Ambulatory Visit: Payer: Self-pay | Admitting: Internal Medicine

## 2012-05-31 ENCOUNTER — Other Ambulatory Visit (INDEPENDENT_AMBULATORY_CARE_PROVIDER_SITE_OTHER): Payer: Medicare Other

## 2012-05-31 ENCOUNTER — Ambulatory Visit (INDEPENDENT_AMBULATORY_CARE_PROVIDER_SITE_OTHER): Payer: Medicare Other | Admitting: Internal Medicine

## 2012-05-31 ENCOUNTER — Ambulatory Visit (INDEPENDENT_AMBULATORY_CARE_PROVIDER_SITE_OTHER)
Admission: RE | Admit: 2012-05-31 | Discharge: 2012-05-31 | Disposition: A | Discharge status: Home / Self Care | Payer: Medicare Other | Source: Ambulatory Visit | Attending: Internal Medicine | Admitting: Internal Medicine

## 2012-05-31 ENCOUNTER — Encounter: Payer: Self-pay | Admitting: Internal Medicine

## 2012-05-31 VITALS — BP 112/62 | HR 66 | Temp 98.0°F | Ht 67.0 in | Wt 136.8 lb

## 2012-05-31 DIAGNOSIS — M3130 Wegener's granulomatosis without renal involvement: Secondary | ICD-10-CM

## 2012-05-31 DIAGNOSIS — Z23 Encounter for immunization: Secondary | ICD-10-CM

## 2012-05-31 DIAGNOSIS — J479 Bronchiectasis, uncomplicated: Secondary | ICD-10-CM

## 2012-05-31 LAB — BASIC METABOLIC PANEL
CO2: 27 mEq/L (ref 19–32)
Calcium: 9.1 mg/dL (ref 8.4–10.5)
GFR: 66.08 mL/min (ref >60.00)
Sodium: 142 mEq/L (ref 135–145)

## 2012-05-31 LAB — CBC WITH DIFFERENTIAL/PLATELET
Basophils Relative: 0.4 % (ref 0.0–3.0)
Eosinophils Relative: 1.2 % (ref 0.0–5.0)
HCT: 39.8 % (ref 39.0–52.0)
Hemoglobin: 13.2 g/dL (ref 13.0–17.0)
Lymphs Abs: 1 10*3/uL (ref 0.7–4.0)
MCV: 96.8 fl (ref 78.0–100.0)
Monocytes Absolute: 0.5 10*3/uL (ref 0.1–1.0)
Monocytes Relative: 6.4 % (ref 3.0–12.0)
Neutro Abs: 5.9 10*3/uL (ref 1.4–7.7)
Platelets: 244 10*3/uL (ref 150.0–400.0)
RBC: 4.11 Mil/uL — ABNORMAL LOW (ref 4.22–5.81)
WBC: 7.5 10*3/uL (ref 4.5–10.5)

## 2012-05-31 LAB — SEDIMENTATION RATE: Sed Rate: 15 mm/hr (ref 0–22)

## 2012-05-31 NOTE — Assessment & Plan Note (Signed)
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 > CTX started    - Off all prednisone with baseline esr 31 11/16/2010  > restarted 12/16/2010 with nausea and wt down to 115 but ok sats walking x 3 laps   - Recheck ANCA sent  12/16/2010  >  neg   -Trial off cytoxan starting 02/12/2011 >   03/16/11 C-Anca Pos 1:160 >> 05/31/2012   Not clear he has any dz activity at all at this point so try wean down prednisone to 2.5 mg per day by next ov

## 2012-05-31 NOTE — Progress Notes (Signed)
Subjective:    Patient ID: Richard [name], male    DOB: Feb 09, 1936, 76 y.o.   MRN: [id number]  HPI 47  yowm never smoker with dx of Longstanding bronchiectasis then dx with WG 03/2010 > requested establish with [name].    Admit [facility] dx WG by pos c anca 03/2010, supportive tbbx but not vats   Admit [name] dx spont L Ptx 9/08/15/09 requiring Chest tube but no surgery   05/24/10--NP ov/ med reivew. Since last visit. has been hospitalized 05/12/2010-- 10/10/2011for Right pneumothorax., with underlying Wegener granulomatosis. and . Bronchiectasis. Pt was found to have Right Pneumo. Chest tube was placed. with improvement and removed 10/7. Xray on 10/10 w/ resolved pnuemo. He was continued on cytoxan and bactrim for PCP prophylaxis. rec. Prednisone 20 mg reduce to one half daily  Aspirin 81 mg one daily with breakfast but stop for any bleeding  See Patient Care Coordinator before leaving for cardiology appt > saw Dr [name], ? LHC needed     October 13, 2010 --Presents for an acute office visit. Complains of productive cough with yellow mucus, sore throat, rattling in chest, low grade temp x3days. Currently on pred 1/2 every other day . OTC not helping. rec continue qod pred, rx with augmentin x 10 days   11/16/2010 ov all smiles, no change on days of prednisone, minimal discolored sputum.   Try off prednisone completely.  If you feel a lot worse over the next  week(worse breathing/ coughing/ nausea or loss of appetite) resume prednisone 5 mg one half daily until better then return back one half every other day    12/16/2010 ov/[name] cc no better on 5mg  dosed one half every other day (not the dose rec), still nauseated, still discolored p doxy complete May 7, sob in shower but not as much walking and no longer using 02, struggling with contingency plans. rec Work on inhaler technique:  If short of breath go ahead and use the xopenex hfa 2 puffs every 4 hours  Prednisone 5 mg 4 daily until better, then 2 daily x  5 days, then 1 daily thereafter   12/31/10 ov/[name], exac of bronchiectasis/wheeze no hemoptysis rx with pred burst and levaquin 750 x 5d  01/13/2011 ov/[name]  Cc sob and cough better now tapered prednisone to 5 mg each am using xop once daily around noon no problem with sleep or am exac of cough.   rec  02/11/2011 [name] cc recurrent hemoptysis on top of yellow that usually comes up esp in am x one year but no epistaxis, no cp or arthralgias or increased doe. rec Levaquin 750 mg x 5 days courses whenever mucus turns bloody, more dark or with high fever. Stop cytoxan and sulfasoxazole  Don't take aspirin if any active bleeding   03/16/2011 f/u ov/[name] cc no more blood,  Minimal yellow mucus, back on asa 81 without hemoptysis. No sob rec  Try prednisone 5 mg  One on even and a half on odd   Acute Visit 04/26/11 -[name]  Cc  increased chest congestion, yellow sputum beginning about 1 week ago. He took a levaquin x 5 days (9/11 - 9/15). Has a sore throat, getting better. Nasal drainage and sinus drainage worse over last 3 -4 days. He tells me that he cut grass last Thursday, may have started this. No sick contacts.  rec Please start levaquin daily for another 5 days Continue your current prednisone dosing  Start loratadine 10mg  daily until your next visit  05/27/2011 f/u ov/[name] on 2.5 mg pred /day  cc still coughing, congested though much less purulent sputum p levaquin and no hemoptysis.  Does note fatigue > baseline  With doe worse x garbage cans to street and back and has not tried the xopenex at all. rec Try using xopenex as needed for short of breath Only take the loratidine (clariton) if needed for itching sneezing runny nose     07/19/2011 ER follow up 07/15/11 with increased cough, congestion and fever. Dx with acute bronchiectasis and possible influenza. He has started on Levaquin 750mg  x 5 days w/ last day today. Also given Tamiflu but did not fill this. Fevers are decreasing.  Cough and congesiton are improved but not gone. Still feels weak.CXR w/o acute changes.  Labs from ER reviewed with elevated WBC 11k, w/ left shift. Neg strep test. No influenza data found.  rec Extend Levaquin daily for 5 days  Mucinex DM Twice daily  As needed  Cough/congestion  Fluids and rest  Tylenol As needed  Fever   08/24/2011 f/u ov/[name] on Prednisone 5 mg one half each am  cc cough/ congestion back to baseline, no limiting sob. rec Try prednisone 5 mg one half pill alternate days to see if any worse respiratory symptoms or nausea, fatigue get worse Ok to adjust the mucinex dm to take up to 2 every 12 hours as needed For itching sneezing and runny nose, use the loratidine 10 mg (clariton)            OV 09/28/2011 ACUTE OFFICE VISIT WITH DR RAMASWAMY  - 68 y old male patient of Dr [name] with bronchiectasis, wegener (intolerant to cytoxan and maintained on low dose alternate day prednisone), cachexia (Body mass index is 20.33 kg/(m^2).Marland Kitchen Presents with wife. PAst 3-4 days increased cough, change in color sputum to yellow, wheeze, chest tightness, and fatigue. Denies fever. Today is day 3 of levaquin which has helped cough intensity and sputum volume/color partially but still very fatigued. No improvement in wheeze. He and wife are very concerned that this could worsen to pneumonia and though better decided to seek acute visit. rec You are probably having an exacerbation or attack of bronchiectasis To figure out if there is pneumonia please have chest xray; will inform you of results in next day or so Please stop levaquin STart omnicef generic 300mg  twice daily x 7 days; call us if expensive Please take Take prednisone 40mg  once daily x 3 days, then 30mg  once daily x 3 days, then 20mg  once daily x 3 days, then prednisone 10mg  once daily  x 3 days and then 5mg  daily x 3 days and then to your baseline regimen of alternating rec You are probably having an exacerbation or attack of  bronchiectasis To figure out if there is pneumonia please have chest xray; will inform you of results in next day or so Please stop levaquin STart omnicef generic 300mg  twice daily x 7 days; call us if expensive Please take Take prednisone 40mg  once daily x 3 days, then 30mg  once daily x 3 days, then 20mg  once daily x 3 days, then prednisone 10mg  once daily  x 3 days and then 5mg  daily x 3 days and then to your baseline regimen of alternating  11/28/2011 f/u ov/[name] cc much better on higher doses of prednisone but worse w/in 5 days of floor of qod regimen with increase cough/ congestion minimally discolored sputum no epistaxis hemoptysis or aches/ pains x vague discomfort r ant chest under  breast.  No hematuria rec Prednisone 20 mg per day until better, then 10 mg per day x 5 days, and then 5 mg per day.      02/29/2012 f/u ov/[name] cc better p last flare rx augmentin by Dr Clent Ridges, min yellow mucus, no blood, still on prednisone @ 5 mg per day no change doe, no sinus compliants. rec Ok to try taking prednisone just on even days to see if it makes any difference, if not leave the dose there   05/31/2012 f/u ov/[name] cc no change in cough, yellowish, thick, worse in ams,  not  Bloody- no epistaxis, rash, arthralgias or sob.  No obvious daytime variabilty or assoc  cp or chest tightness, subjective wheeze overt sinus or hb symptoms. No unusual exp hx     Sleeping ok without nocturnal  or early am exacerbation  of respiratory  c/o's or need for noct saba. Also denies any obvious fluctuation of symptoms with weather or environmental changes or other aggravating or alleviating factors except as outlined above   ROS  The following are not active complaints unless bolded sore throat, dysphagia, dental problems, itching, sneezing,  nasal congestion or excess/ purulent secretions, ear ache,   fever, chills, sweats, unintended wt loss, pleuritic or exertional cp, hemoptysis,  orthopnea pnd or leg swelling,  presyncope, palpitations, heartburn, abdominal pain, anorexia, nausea, vomiting, diarrhea  or change in bowel or urinary habits, change in stools or urine, dysuria,hematuria,  rash, arthralgias, visual complaints, headache, numbness weakness or ataxia or problems with walking or coordination,  change in mood/affect or memory.           Objective:   Physical Exam wt   128  04/26/11 >07/08/2011  134> 134 11/28/2011 > 02/29/2012 136> 05/31/2012  136 amb thin pleasant wm nad  SKIN: no rash, lesions  NODES: no lymphadenopathy  HEENT: [place]/AT, EOM- WNL, Conjuctivae- clear, PERRLA, TM-WNL, Nose- clear, Throat- clear and wnl, Mallampati III  NECK: Supple w/ fair ROM, JVD- none, normal carotid impulses w/o bruits Thyroid-  CHEST: min bilateral exp rhonchi  No resp distress HEART: RRR, no m/g/r heard  ABDOMEN: Soft and nl EXT:  No deformities or restrucitons    CXR  05/31/2012 :  No active disease. No significant change             Assessment & Plan:

## 2012-05-31 NOTE — Patient Instructions (Addendum)
Try prednisone 5 mg  One on even and one-half  on odd - if no change then 2 weeks before your next scheduled pulmonary visit take one half every day  Please remember to go to the lab and x-ray department downstairs for your tests - we will call you with the results when they are available.     Please schedule a follow up visit in 3 months but call sooner if needed

## 2012-05-31 NOTE — Assessment & Plan Note (Addendum)
-   HFA 90% p coaching 12/16/2010      - PFT's 06/21/11   FEV1  1.60 (61%) ratio 48 and 10% better p B2,  DLCO 83%  Lab Results  Component Value Date   ESRSEDRATE 15 05/31/2012     Adequate control on present rx, reviewed    Each maintenance medication was reviewed in detail including most importantly the difference between maintenance and as needed and under what circumstances the prns are to be used. This was done in the context of a medication calendar review which provided the patient with a user-friendly unambiguous mechanism for medication administration and reconciliation and provides an action plan for all active problems. It is critical that this be shown to every doctor  for modification during the office visit if necessary so the patient can use it as a working document.

## 2012-06-01 LAB — ANCA SCREEN W REFLEX TITER: c-ANCA Screen: NEGATIVE

## 2012-06-01 NOTE — Progress Notes (Signed)
Quick Note:  Spoke with pt and notified of results per Dr. [name]. Pt verbalized understanding and denied any questions.  ______ 

## 2012-06-04 NOTE — Progress Notes (Signed)
Quick Note:  Spoke with pt and notified of results per Dr. [name]. Pt verbalized understanding and denied any questions.  ______ 

## 2012-06-12 ENCOUNTER — Other Ambulatory Visit: Payer: Self-pay | Admitting: Internal Medicine

## 2012-06-17 ENCOUNTER — Other Ambulatory Visit: Payer: Self-pay | Admitting: Family Medicine

## 2012-07-09 IMAGING — CR DG CHEST 2V
2 series · 2 of 2 positions shown · non-contrast
Comparison: 09/28/2011 and multiple earlier examinations

CLINICAL DATA: Cough, shortness of breath, Wolf

CHEST - 2 VIEW

[view not recorded (1 of 2)]
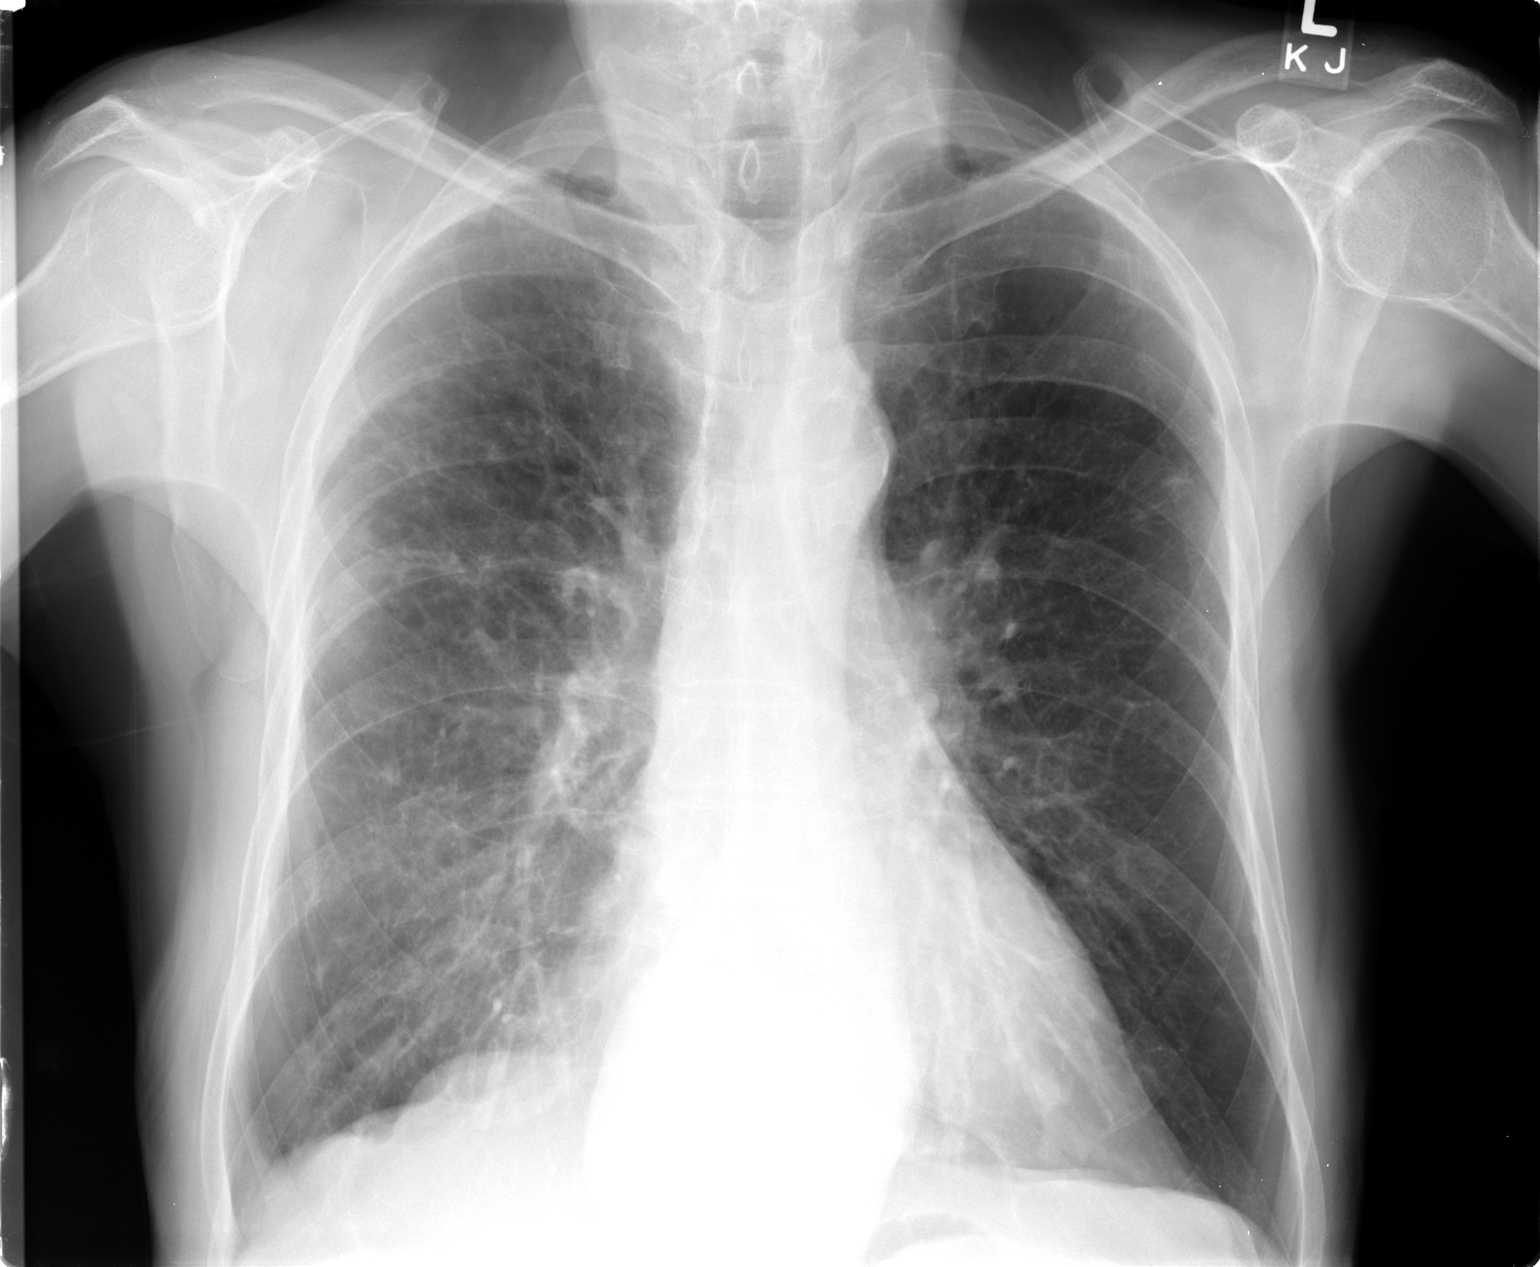

[view not recorded (2 of 2)]
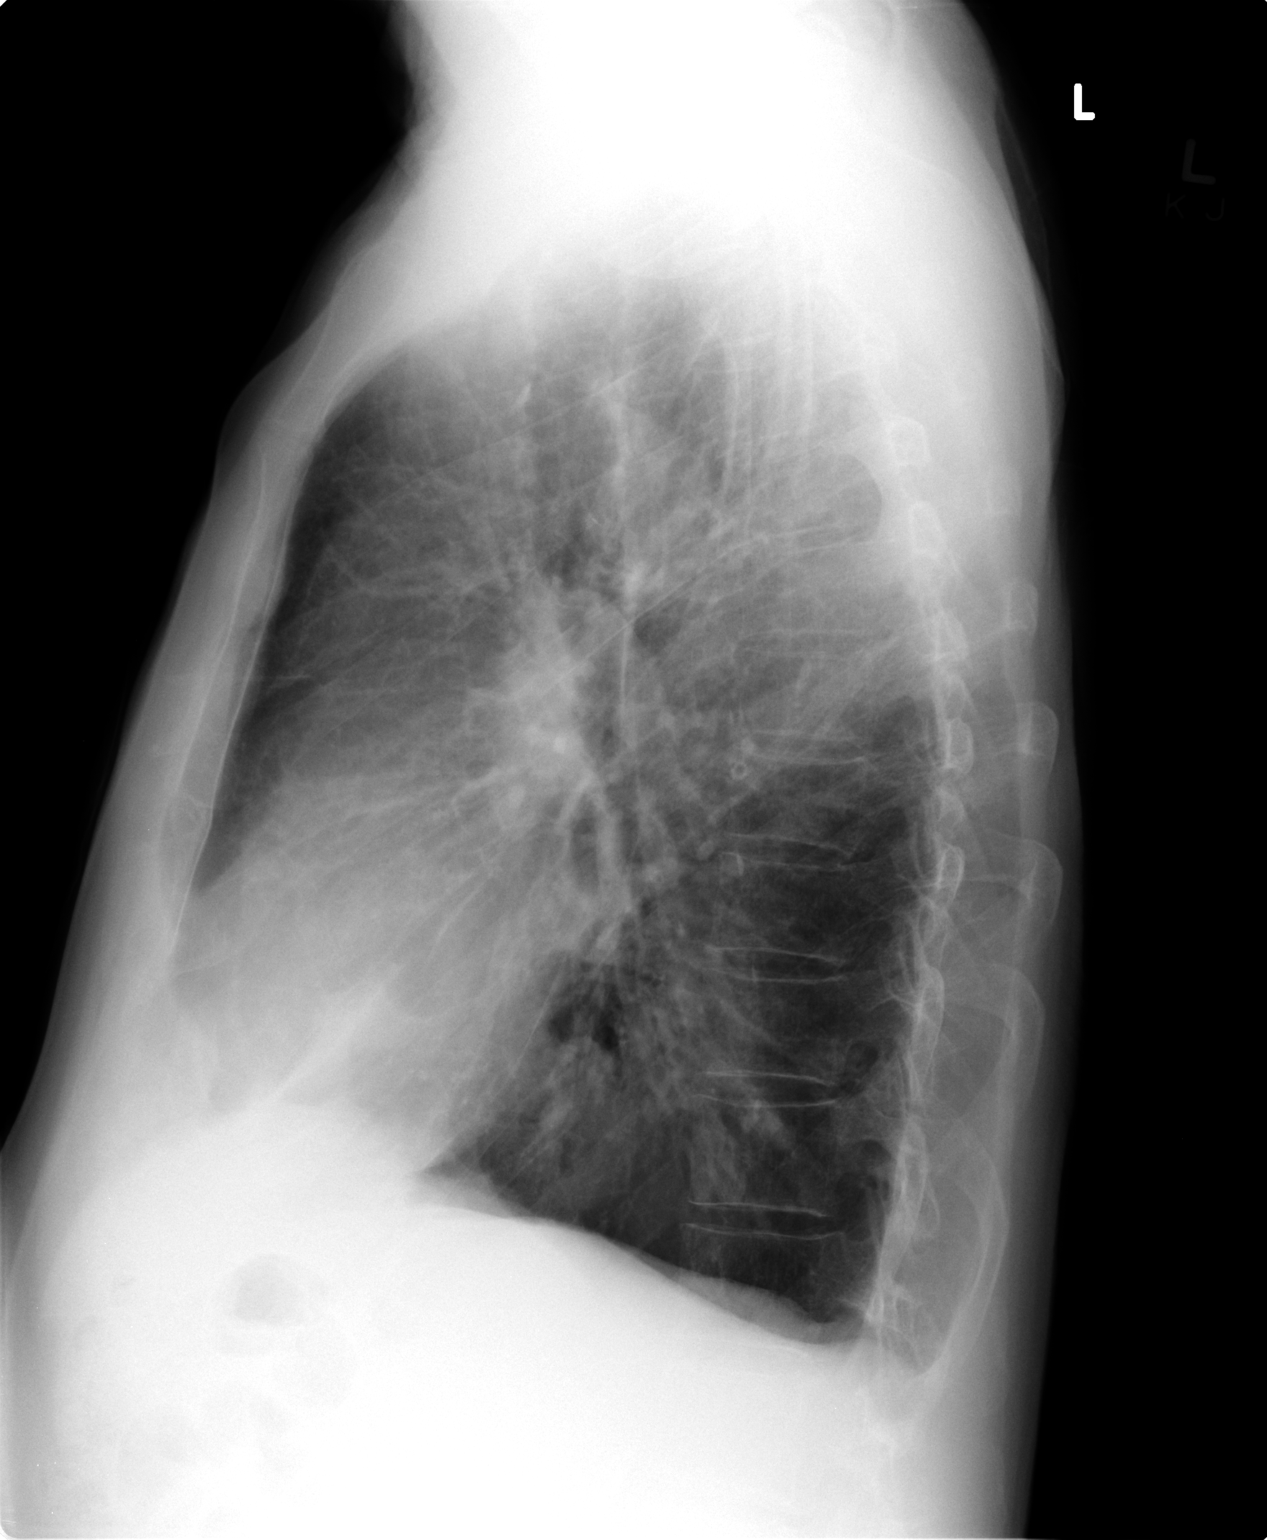

[2 of 2 positions shown; findings below may reference images not displayed]

FINDINGS: Coarse perihilar and bibasilar interstitial opacities.
Sub centimeter nodular densities in the midlung zones, right
greater than left. Little change since films dating back to
02/11/2011.  No new confluent airspace infiltrate.  Hiatal hernia
with fluid level.  No effusion.  Mild hyperinflation.  Regional
bones unremarkable.  Atheromatous aortic arch.
IMPRESSION: 1.  Stable asymmetric parenchymal changes without acute or
superimposed abnormality.

## 2012-07-16 ENCOUNTER — Other Ambulatory Visit: Payer: Self-pay | Admitting: Family Medicine

## 2012-08-06 ENCOUNTER — Telehealth: Payer: Self-pay | Admitting: Internal Medicine

## 2012-08-06 MED ORDER — PREDNISONE 5 MG PO TABS: 5.0000 mg | ORAL_TABLET | ORAL | Status: DC

## 2012-08-06 NOTE — Telephone Encounter (Signed)
Pt aware that is prescription has been fixed and sent to the pharmacy.

## 2012-09-03 ENCOUNTER — Other Ambulatory Visit: Payer: Self-pay | Admitting: Gastroenterology

## 2012-09-03 MED ORDER — ESOMEPRAZOLE MAGNESIUM 40 MG PO CPDR: 40.0000 mg | DELAYED_RELEASE_CAPSULE | Freq: Every day | ORAL | Status: DC

## 2012-09-03 NOTE — Telephone Encounter (Signed)
Prescription sent to patient's pharmacy with one refill and told patient to keep appt on 09/19/12 for any further refills. Pt agreed and verbalized understanding.

## 2012-09-07 ENCOUNTER — Telehealth: Payer: Self-pay | Admitting: Internal Medicine

## 2012-09-07 MED ORDER — LEVOFLOXACIN 750 MG PO TABS: 750.0000 mg | ORAL_TABLET | Freq: Every day | ORAL | Status: DC

## 2012-09-07 NOTE — Telephone Encounter (Signed)
Called and spoke with pt and he stated that he is on his last few days of the levaquin rx and he has no refills.  Needs a refill called into the pharmacy to keep on hand --[facility] [address]--.  Pt stated that he can go about 7-10 days without the rx of the levaquin before his chest starts to feel tight with water.  i have scheduled an appt with MW on 2-5 at 2pm with MW.  Please advise if ok to send in rx for the levaquin.  Thanks  No Known Allergies

## 2012-09-07 NOTE — Telephone Encounter (Signed)
Tammy, Dr. [name] out of office. Will you pls advise.  Thank you.

## 2012-09-07 NOTE — Telephone Encounter (Signed)
Begin Levaquin 750mg  daily x 7d #7  Ov as planned and As needed   Please contact office for sooner follow up if symptoms do not improve or worsen or seek emergency care

## 2012-09-07 NOTE — Telephone Encounter (Signed)
Rx has been sent to his pharmacy, he is aware.

## 2012-09-11 ENCOUNTER — Encounter: Payer: Self-pay | Admitting: Family Medicine

## 2012-09-11 ENCOUNTER — Ambulatory Visit (INDEPENDENT_AMBULATORY_CARE_PROVIDER_SITE_OTHER): Payer: Medicare Other | Admitting: Family Medicine

## 2012-09-11 VITALS — BP 116/70 | HR 82 | Temp 98.3°F | Wt 136.0 lb

## 2012-09-11 DIAGNOSIS — J40 Bronchitis, not specified as acute or chronic: Secondary | ICD-10-CM

## 2012-09-11 MED ORDER — CLARITHROMYCIN 500 MG PO TABS: 500.0000 mg | ORAL_TABLET | Freq: Two times a day (BID) | ORAL | Status: DC

## 2012-09-11 MED ORDER — HYDROCODONE-HOMATROPINE 5-1.5 MG/5ML PO SYRP: 5.0000 mL | ORAL_SOLUTION | ORAL | Status: AC | PRN

## 2012-09-11 NOTE — Progress Notes (Signed)
  Subjective:    Patient ID: Richard [name], male    DOB: 02-Jun-1936, 77 y.o.   MRN: [id number]  HPI Here for several weeks of coughing up yellow sputum. No fevers. He has taken a week of Levaquin as he usually does but this time it had no effect. He has been taking Levaquin 2 or 3 weeks out of each month for the past 4 months.    Review of Systems  Constitutional: Negative.   HENT: Negative.   Eyes: Negative.   Respiratory: Positive for cough and chest tightness. Negative for shortness of breath and wheezing.   Cardiovascular: Negative.        Objective:   Physical Exam  Constitutional: He appears well-developed and well-nourished. No distress.  HENT:  Right Ear: External ear normal.  Left Ear: External ear normal.  Nose: Nose normal.  Mouth/Throat: Oropharynx is clear and moist.  Eyes: Conjunctivae normal are normal.  Neck: Neck supple.  Pulmonary/Chest: No respiratory distress. He has no rales.       Scattered wheezes and rhonchi   Lymphadenopathy:    He has no cervical adenopathy.          Assessment & Plan:  Partially treated bronchitis. We will try Biaxin this time, and we decided to switch to this for him to use prophylactically in the future as well.

## 2012-09-12 ENCOUNTER — Ambulatory Visit: Payer: Medicare Other | Admitting: Internal Medicine

## 2012-09-19 ENCOUNTER — Ambulatory Visit: Payer: Medicare Other | Admitting: Gastroenterology

## 2012-10-03 ENCOUNTER — Encounter: Payer: Self-pay | Admitting: Gastroenterology

## 2012-10-03 ENCOUNTER — Ambulatory Visit (INDEPENDENT_AMBULATORY_CARE_PROVIDER_SITE_OTHER): Payer: Medicare Other | Admitting: Gastroenterology

## 2012-10-03 VITALS — BP 130/78 | HR 72 | Ht 66.0 in | Wt 135.5 lb

## 2012-10-03 DIAGNOSIS — Z86010 Personal history of colonic polyps: Secondary | ICD-10-CM

## 2012-10-03 MED ORDER — ESOMEPRAZOLE MAGNESIUM 40 MG PO CPDR: 40.0000 mg | DELAYED_RELEASE_CAPSULE | Freq: Two times a day (BID) | ORAL | Status: DC

## 2012-10-03 NOTE — Patient Instructions (Addendum)
Increase your Nexium to one tablet by mouth twice daily. A new prescription has been sent to your pharmacy.   Thank you for choosing me and [facility].  Venita Lick. Pleas Koch., MD., Clementeen Graham

## 2012-10-03 NOTE — Progress Notes (Signed)
History of Present Illness: This is a 77 year old male who relates nausea and gas that generally begins in the morning after he takes his medications and has breakfast. He states his nausea typically dissipates around lunchtime. He does not note any frequent reflux symptoms. He has a significant underlying: Pulmonary disease and uses oxygen at night.  Current Medications, Allergies, Past Medical History, Past Surgical History, Family History and Social History were reviewed in [employer] record.  Physical Exam: General: Well developed , well nourished, no acute distress Head: Normocephalic and atraumatic Eyes:  sclerae anicteric, EOMI Ears: Normal auditory acuity Mouth: No deformity or lesions Lungs: Scattered rhonchi and decreased air movement bilaterally Heart: Regular rate and rhythm; no murmurs, rubs or bruits Abdomen: Soft, non tender and non distended. No masses, hepatosplenomegaly or hernias noted. Normal Bowel sounds Musculoskeletal: Symmetrical with no gross deformities  Pulses:  Normal pulses noted Extremities: No clubbing, cyanosis, edema or deformities noted Neurological: Alert oriented x 4, grossly nonfocal Psychological:  Alert and cooperative. Normal mood and affect  Assessment and Recommendations:  1. Rule out GERD, medication side effects. Some of his intestinal gas may related to aerophagia related to his chronic lung disease. Increase Nexium to 40 mg twice daily. If twice daily Nexium is not covered will try Nexium every morning and omeprazole every afternoon.  2. Personal history of adenomatous colon polyp. No plans for future surveillance for screening colonoscopies due to his comorbidities.

## 2012-10-09 ENCOUNTER — Ambulatory Visit: Payer: Medicare Other | Admitting: Internal Medicine

## 2012-10-11 ENCOUNTER — Other Ambulatory Visit (INDEPENDENT_AMBULATORY_CARE_PROVIDER_SITE_OTHER): Payer: Medicare Other

## 2012-10-11 ENCOUNTER — Encounter: Payer: Self-pay | Admitting: Internal Medicine

## 2012-10-11 ENCOUNTER — Ambulatory Visit (INDEPENDENT_AMBULATORY_CARE_PROVIDER_SITE_OTHER)
Admission: RE | Admit: 2012-10-11 | Discharge: 2012-10-11 | Disposition: A | Discharge status: Home / Self Care | Payer: Medicare Other | Source: Ambulatory Visit | Attending: Internal Medicine | Admitting: Internal Medicine

## 2012-10-11 ENCOUNTER — Ambulatory Visit (INDEPENDENT_AMBULATORY_CARE_PROVIDER_SITE_OTHER): Payer: Medicare Other | Admitting: Internal Medicine

## 2012-10-11 VITALS — BP 100/62 | HR 75 | Temp 98.2°F | Ht 66.0 in | Wt 138.8 lb

## 2012-10-11 DIAGNOSIS — J479 Bronchiectasis, uncomplicated: Secondary | ICD-10-CM

## 2012-10-11 LAB — CBC WITH DIFFERENTIAL/PLATELET
Basophils Relative: 0.3 % (ref 0.0–3.0)
Eosinophils Relative: 1 % (ref 0.0–5.0)
Hemoglobin: 13.8 g/dL (ref 13.0–17.0)
Lymphocytes Relative: 13.5 % (ref 12.0–46.0)
MCHC: 33.7 g/dL (ref 30.0–36.0)
MCV: 95.1 fl (ref 78.0–100.0)
Neutro Abs: 7.2 10*3/uL (ref 1.4–7.7)
Neutrophils Relative %: 78.8 % — ABNORMAL HIGH (ref 43.0–77.0)
RBC: 4.29 Mil/uL (ref 4.22–5.81)
WBC: 9.1 10*3/uL (ref 4.5–10.5)

## 2012-10-11 LAB — BASIC METABOLIC PANEL
BUN: 18 mg/dL (ref 6–23)
Chloride: 107 mEq/L (ref 96–112)
Creatinine, Ser: 1.2 mg/dL (ref 0.4–1.5)
Glucose, Bld: 90 mg/dL (ref 70–99)
Potassium: 4.5 mEq/L (ref 3.5–5.1)

## 2012-10-11 MED ORDER — PREDNISONE 5 MG PO TABS: ORAL_TABLET | ORAL | Status: DC

## 2012-10-11 MED ORDER — LEVOFLOXACIN 750 MG PO TABS: 750.0000 mg | ORAL_TABLET | Freq: Every day | ORAL | Status: DC

## 2012-10-11 NOTE — Patient Instructions (Addendum)
Prednisone 5 mg pill take one half daily  Please remember to go to the lab and x-ray department downstairs for your tests - we will call you with the results when they are available.  Please schedule a follow up office visit in 6 weeks, call sooner if needed with pfts

## 2012-10-11 NOTE — Progress Notes (Signed)
Quick Note:  Spoke with pt and notified of results per Dr. [name]. Pt verbalized understanding and denied any questions.  ______ 

## 2012-10-11 NOTE — Assessment & Plan Note (Signed)
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 > CTX started    - Off all prednisone with baseline esr 31 11/16/2010  > restarted 12/16/2010 with nausea and wt down to 115 but ok sats walking x 3 laps   - Recheck ANCA sent  12/16/2010  >  neg   -Trial off cytoxan starting 02/12/2011 >   03/16/11 stopped - Reduce prednisone to 5 mg one half qod effective 10/11/2012   No evidence at all of any pulmonary or systemic vasculitis with esr now only 15   The goal with a chronic steroid dependent illness is always arriving at the lowest effective dose that controls the disease/symptoms and not accepting a set "formula" which is based on statistics or guidelines that don't always take into account patient  variability or the natural hx of the dz in every individual patient, which may well vary over time.  For now therefore I recommend the patient maintain  A floor of 5 mg one half qod

## 2012-10-11 NOTE — Assessment & Plan Note (Signed)
-   HFA 90% p coaching 12/16/2010     - PFT's 06/21/11   FEV1  1.60 (61%) ratio 48 and 10% better p B2,  DLCO 83%  Adequate control on present rx, reviewed     Each maintenance medication was reviewed in detail including most importantly the difference between maintenance and as needed and under what circumstances the prns are to be used. This was done in the context of a medication calendar review which provided the patient with a user-friendly unambiguous mechanism for medication administration and reconciliation and provides an action plan for all active problems. It is critical that this be shown to every doctor  for modification during the office visit if necessary so the patient can use it as a working document.

## 2012-10-11 NOTE — Progress Notes (Signed)
Subjective:    Patient ID: Richard [name], male    DOB: [birth year], 77 y.o.   MRN: [id number]  Brief patient profile:  82  yowm never smoker with dx of Longstanding bronchiectasis then dx with WG 03/2010 > requested establish with Richard [name].   Admit [facility] dx WG by pos c anca 03/2010, supportive tbbx but not vats   Admit [name] dx spont L Ptx 9/08/15/09 requiring Chest tube but no surgery   05/24/10--NP ov/ med reivew. Since last visit. has been hospitalized 05/12/2010-- 10/10/2011for Right pneumothorax., with underlying Wegener granulomatosis. and . Bronchiectasis. Pt was found to have Right Pneumo. Chest tube was placed. with improvement and removed 10/7. Xray on 10/10 w/ resolved pnuemo. He was continued on cytoxan and bactrim for PCP prophylaxis. rec. Prednisone 20 mg reduce to one half daily  Aspirin 81 mg one daily with breakfast but stop for any bleeding  See Patient Care Coordinator before leaving for cardiology appt > saw Richard [name], ? LHC needed     October 13, 2010 --Presents for an acute office visit. Complains of productive cough with yellow mucus, sore throat, rattling in chest, low grade temp x3days. Currently on pred 1/2 every other day . OTC not helping. rec continue qod pred, rx with augmentin x 10 days   11/16/2010 ov all smiles, no change on days of prednisone, minimal discolored sputum.   Try off prednisone completely.  If you feel a lot worse over the next  week(worse breathing/ coughing/ nausea or loss of appetite) resume prednisone 5 mg one half daily until better then return back one half every other day    12/16/2010 ov/Richard [name] cc no better on 5mg  dosed one half every other day (not the dose rec), still nauseated, still discolored p doxy complete May 7, sob in shower but not as much walking and no longer using 02, struggling with contingency plans. rec Work on inhaler technique:  If short of breath go ahead and use the xopenex hfa 2 puffs every 4 hours  Prednisone 5 mg 4 daily until  better, then 2 daily x 5 days, then 1 daily thereafter   12/31/10 ov/Richard [name], exac of bronchiectasis/wheeze no hemoptysis rx with pred burst and levaquin 750 x 5d  01/13/2011 ov/Richard [name]  Cc sob and cough better now tapered prednisone to 5 mg each am using xop once daily around noon no problem with sleep or am exac of cough.   rec  02/11/2011 Richard [name] Richard [name] cc recurrent hemoptysis on top of yellow that usually comes up esp in am x one year but no epistaxis, no cp or arthralgias or increased doe. rec Levaquin 750 mg x 5 days courses whenever mucus turns bloody, more dark or with high fever. Stop cytoxan and sulfasoxazole  Don't take aspirin if any active bleeding   03/16/2011 f/u ov/[name] cc no more blood,  Minimal yellow mucus, back on asa 81 without hemoptysis. No sob rec  Try prednisone 5 mg  One on even and a half on odd   Acute Visit 04/26/11 -Richard [name]  Cc  increased chest congestion, yellow sputum beginning about 1 week ago. He took a levaquin x 5 days (9/11 - 9/15). Has a sore throat, getting better. Nasal drainage and sinus drainage worse over last 3 -4 days. He tells me that he cut grass last Thursday, may have started this. No sick contacts.  rec Please start levaquin daily for another 5 days Continue your current prednisone dosing  Start loratadine 10mg  daily until your next  visit    05/27/2011 f/u ov/Richard [name] on 2.5 mg pred /day  cc still coughing, congested though much less purulent sputum p levaquin and no hemoptysis.  Does note fatigue > baseline  With doe worse x garbage cans to street and back and has not tried the xopenex at all. rec Try using xopenex as needed for short of breath Only take the loratidine (clariton) if needed for itching sneezing runny nose     07/19/2011 ER follow up 07/15/11 with increased cough, congestion and fever. Dx with acute bronchiectasis and possible influenza. He has started on Levaquin 750mg  x 5 days w/ last day today. Also given Tamiflu but did not fill this.  Fevers are decreasing. Cough and congesiton are improved but not gone. Still feels weak.CXR w/o acute changes.  Labs from ER reviewed with elevated WBC 11k, w/ left shift. Neg strep test. No influenza data found.  rec Extend Levaquin daily for 5 days  Mucinex DM Twice daily  As needed  Cough/congestion  Fluids and rest  Tylenol As needed  Fever   08/24/2011 f/u ov/Richard [name] on Prednisone 5 mg one half each am  cc cough/ congestion back to baseline, no limiting sob. rec Try prednisone 5 mg one half pill alternate days to see if any worse respiratory symptoms or nausea, fatigue get worse Ok to adjust the mucinex dm to take up to 2 every 12 hours as needed For itching sneezing and runny nose, use the loratidine 10 mg (clariton)            OV 09/28/2011 ACUTE OFFICE VISIT WITH Richard Richard [name] with flare cough  rec Please take Take prednisone 40mg  once daily x 3 days, then 30mg  once daily x 3 days, then 20mg  once daily x 3 days, then prednisone 10mg  once daily  x 3 days and then 5mg  daily x 3 days and then to your baseline regimen of alternating  11/28/2011 f/u ov/Richard [name] cc much better on higher doses of prednisone but worse w/in 5 days of floor of qod regimen with increase cough/ congestion minimally discolored sputum no epistaxis hemoptysis or aches/ pains x vague discomfort r ant chest under breast.  No hematuria rec Prednisone 20 mg per day until better, then 10 mg per day x 5 days, and then 5 mg per day.      02/29/2012 f/u ov/Richard [name] cc better p last flare rx augmentin by Richard [name], min yellow mucus, no blood, still on prednisone @ 5 mg per day no change doe, no sinus compliants. rec Ok to try taking prednisone just on even days to see if it makes any difference, if not leave the dose there   05/31/2012 f/u ov/Richard [name] cc no change in cough, yellowish, thick, worse in ams,  not  Bloody- no epistaxis, rash, arthralgias or sob.   rec Try prednisone 5 mg  One on even and one-half  on odd - if no change  then 2 weeks before your next scheduled pulmonary visit take one half every day   10/11/2012 f/u ov/Yaneth Fairbairn still on one on even and half on odd prednisone, rare need for levaquin, no hemoptysis, rash, arthralgias, hematuria.    No obvious daytime variabilty or assoc  cp or chest tightness, subjective wheeze overt sinus or hb symptoms. No unusual exp hx     Sleeping ok without nocturnal  or early am exacerbation  of respiratory  c/o's or need for noct saba. Also denies any obvious fluctuation of symptoms with weather or environmental changes or  other aggravating or alleviating factors except as outlined above   ROS  The following are not active complaints unless bolded sore throat, dysphagia, dental problems, itching, sneezing,  nasal congestion or excess/ purulent secretions, ear ache,   fever, chills, sweats, unintended wt loss, pleuritic or exertional cp, hemoptysis,  orthopnea pnd or leg swelling, presyncope, palpitations, heartburn, abdominal pain, anorexia, nausea, vomiting, diarrhea  or change in bowel or urinary habits, change in stools or urine, dysuria,hematuria,  rash, arthralgias, visual complaints, headache, numbness weakness or ataxia or problems with walking or coordination,  change in mood/affect or memory.           Objective:   Physical Exam wt   128  04/26/11 >07/08/2011  134> 134 11/28/2011 > 02/29/2012 136> 05/31/2012  136 amb thin pleasant wm nad  SKIN: no rash, lesions  NODES: no lymphadenopathy  HEENT: [place]/AT, EOM- WNL, Conjuctivae- clear, PERRLA, TM-WNL, Nose- clear, Throat- clear and wnl, Mallampati III  NECK: Supple w/ fair ROM, JVD- none, normal carotid impulses w/o bruits Thyroid-  CHEST: min bilateral exp rhonchi  No resp distress HEART: RRR, no m/g/r heard  ABDOMEN: Soft and nl EXT:  No deformities or restrucitons    CXR  10/11/2012 :  Chronic changes in the lungs without acute findings  Labs 10/11/2012 ok including esr 15              Assessment & Plan:

## 2012-11-22 ENCOUNTER — Telehealth: Payer: Self-pay | Admitting: Internal Medicine

## 2012-11-22 ENCOUNTER — Ambulatory Visit (INDEPENDENT_AMBULATORY_CARE_PROVIDER_SITE_OTHER): Payer: Medicare Other | Admitting: Internal Medicine

## 2012-11-22 ENCOUNTER — Encounter: Payer: Self-pay | Admitting: Internal Medicine

## 2012-11-22 VITALS — BP 104/60 | HR 68 | Temp 97.9°F | Ht 66.0 in | Wt 134.0 lb

## 2012-11-22 DIAGNOSIS — J479 Bronchiectasis, uncomplicated: Secondary | ICD-10-CM

## 2012-11-22 DIAGNOSIS — M3130 Wegener's granulomatosis without renal involvement: Secondary | ICD-10-CM

## 2012-11-22 DIAGNOSIS — J471 Bronchiectasis with (acute) exacerbation: Secondary | ICD-10-CM

## 2012-11-22 LAB — PULMONARY FUNCTION TEST

## 2012-11-22 NOTE — Telephone Encounter (Signed)
It will be a while before we can officially stop it - the test will need to be done, processed, sent to me and then the order given.  If I write to d/c it now and then we need to restart it after the test that will be clumsy.

## 2012-11-22 NOTE — Telephone Encounter (Signed)
MW, Lorain Childes about patients O2 and okay to send [facility] order to pick up small tanks in home? Thanks. Pt aware we are working on this and may not be until Monday before we have an answer.

## 2012-11-22 NOTE — Patient Instructions (Addendum)
No change prednisone 5 mg one half daily  > ok to try every other day if doing great  We will have your oxygen level checked without oxygen overnight  to see if you still need to use it.   Please schedule a follow up visit in 3 months but call sooner if needed

## 2012-11-22 NOTE — Progress Notes (Signed)
PFT done today. Katie Welchel,CMA  

## 2012-11-23 NOTE — Progress Notes (Signed)
Subjective:    Patient ID: Richard [name], male    DOB: 1935-12-18, 77 y.o.   MRN: [id number]  Brief patient profile:  30  yowm never smoker with dx of Longstanding bronchiectasis then dx with WG 03/2010 > requested establish with Richard [name].   Admit [facility] dx WG by pos c anca 03/2010, supportive tbbx but not vats   Admit [name] dx spont L Ptx 9/08/15/09 requiring Chest tube but no surgery   05/24/10--NP ov/ med reivew. Since last visit. has been hospitalized 05/12/2010-- 10/10/2011for Right pneumothorax., with underlying Wegener granulomatosis. and . Bronchiectasis. Pt was found to have Right Pneumo. Chest tube was placed. with improvement and removed 10/7. Xray on 10/10 w/ resolved pnuemo. He was continued on cytoxan and bactrim for PCP prophylaxis. rec. Prednisone 20 mg reduce to one half daily  Aspirin 81 mg one daily with breakfast but stop for any bleeding  See Patient Care Coordinator before leaving for cardiology appt > saw Dr Richard [name], ? LHC needed     October 13, 2010 --Presents for an acute office visit. Complains of productive cough with yellow mucus, sore throat, rattling in chest, low grade temp x3days. Currently on pred 1/2 every other day . OTC not helping. rec continue qod pred, rx with augmentin x 10 days   11/16/2010 ov all smiles, no change on days of prednisone, minimal discolored sputum.   Try off prednisone completely.  If you feel a lot worse over the next  week(worse breathing/ coughing/ nausea or loss of appetite) resume prednisone 5 mg one half daily until better then return back one half every other day    12/16/2010 ov/Richard [name] cc no better on 5mg  dosed one half every other day (not the dose rec), still nauseated, still discolored p doxy complete May 7, sob in shower but not as much walking and no longer using 02, struggling with contingency plans. rec Work on inhaler technique:  If short of breath go ahead and use the xopenex hfa 2 puffs every 4 hours  Prednisone 5 mg 4 daily until  better, then 2 daily x 5 days, then 1 daily thereafter   12/31/10 ov/[name], exac of bronchiectasis/wheeze no hemoptysis rx with pred burst and levaquin 750 x 5d  01/13/2011 ov/Richard [name]  Cc sob and cough better now tapered prednisone to 5 mg each am using xop once daily around noon no problem with sleep or am exac of cough.   rec  02/11/2011 Richard [name] Richard [name] cc recurrent hemoptysis on top of yellow that usually comes up esp in am x one year but no epistaxis, no cp or arthralgias or increased doe. rec Levaquin 750 mg x 5 days courses whenever mucus turns bloody, more dark or with high fever. Stop cytoxan and sulfasoxazole  Don't take aspirin if any active bleeding   03/16/2011 f/u ov/Richard [name] cc no more blood,  Minimal yellow mucus, back on asa 81 without hemoptysis. No sob rec  Try prednisone 5 mg  One on even and a half on odd   Acute Visit 04/26/11 -Richard [name]  Cc  increased chest congestion, yellow sputum beginning about 1 week ago. He took a levaquin x 5 days (9/11 - 9/15). Has a sore throat, getting better. Nasal drainage and sinus drainage worse over last 3 -4 days. He tells me that he cut grass last Thursday, may have started this. No sick contacts.  rec Please start levaquin daily for another 5 days Continue your current prednisone dosing  Start loratadine 10mg  daily until your next  visit    05/27/2011 f/u ov/Richard [name] on 2.5 mg pred /day  cc still coughing, congested though much less purulent sputum p levaquin and no hemoptysis.  Does note fatigue > baseline  With doe worse x garbage cans to street and back and has not tried the xopenex at all. rec Try using xopenex as needed for short of breath Only take the loratidine (clariton) if needed for itching sneezing runny nose     07/19/2011 ER follow up 07/15/11 with increased cough, congestion and fever. Dx with acute bronchiectasis and possible influenza. He has started on Levaquin 750mg  x 5 days w/ last day today. Also given Tamiflu but did not fill this.  Fevers are decreasing. Cough and congesiton are improved but not gone. Still feels weak.CXR w/o acute changes.  Labs from ER reviewed with elevated WBC 11k, w/ left shift. Neg strep test. No influenza data found.  rec Extend Levaquin daily for 5 days  Mucinex DM Twice daily  As needed  Cough/congestion  Fluids and rest  Tylenol As needed  Fever   08/24/2011 f/u ov/Richard [name] on Prednisone 5 mg one half each am  cc cough/ congestion back to baseline, no limiting sob. rec Try prednisone 5 mg one half pill alternate days to see if any worse respiratory symptoms or nausea, fatigue get worse Ok to adjust the mucinex dm to take up to 2 every 12 hours as needed For itching sneezing and runny nose, use the loratidine 10 mg (clariton)   OV 09/28/2011 ACUTE OFFICE VISIT WITH DR Richard [name] with flare cough  rec Please take Take prednisone 40mg  once daily x 3 days, then 30mg  once daily x 3 days, then 20mg  once daily x 3 days, then prednisone 10mg  once daily  x 3 days and then 5mg  daily x 3 days and then to your baseline regimen of alternating  11/28/2011 f/u ov/Richard [name] cc much better on higher doses of prednisone but worse w/in 5 days of floor of qod regimen with increase cough/ congestion minimally discolored sputum no epistaxis hemoptysis or aches/ pains x vague discomfort r ant chest under breast.  No hematuria rec Prednisone 20 mg per day until better, then 10 mg per day x 5 days, and then 5 mg per day.    05/31/2012 f/u ov/Richard [name] cc no change in cough, yellowish, thick, worse in ams,  not  Bloody- no epistaxis, rash, arthralgias or sob.   rec Try prednisone 5 mg  One on even and one-half  on odd - if no change then 2 weeks before your next scheduled pulmonary visit take one half every day   10/11/2012 f/u ov/Richard [name] still on one on even and half on odd prednisone(did not taper as per instructions, though no flare in symptoms), rare need for levaquin rec Prednisone 5 mg pill take one half daily  11/23/2012 f/u  ov/Richard [name] re WG/ bronchiectasis Chief Complaint  Patient presents with  . Follow-up    Had PFT today-sob same,cough-yellow,wheezing occass.,  no hemoptysis, no real limiting sob though sedentary, no arthralgias or rash maintaining 2.5 mg daily   No obvious daytime variabilty or assoc  cp or chest tightness, subjective wheeze overt sinus or hb symptoms. No unusual exp hx     Sleeping ok without nocturnal  or early am exacerbation  of respiratory  c/o's or need for noct saba. Also denies any obvious fluctuation of symptoms with weather or environmental changes or other aggravating or alleviating factors except as outlined above   ROS  The  following are not active complaints unless bolded sore throat, dysphagia, dental problems, itching, sneezing,  nasal congestion or excess/ purulent secretions, ear ache,   fever, chills, sweats, unintended wt loss, pleuritic or exertional cp, hemoptysis,  orthopnea pnd or leg swelling, presyncope, palpitations, heartburn, abdominal pain, anorexia, nausea, vomiting, diarrhea  or change in bowel or urinary habits, change in stools or urine, dysuria,hematuria,  rash, arthralgias, visual complaints, headache, numbness weakness or ataxia or problems with walking or coordination,  change in mood/affect or memory.           Objective:   Physical Exam wt   128  04/26/11 >07/08/2011  134> 134 11/28/2011 > 02/29/2012 136> 05/31/2012  136>  134 11/22/12  amb thin pleasant wm nad  SKIN: no rash, lesions  NODES: no lymphadenopathy  HEENT: [place]/AT, EOM- WNL, Conjuctivae- clear, PERRLA, TM-WNL, Nose- clear, Throat- clear and wnl, Mallampati III  NECK: Supple w/ fair ROM, JVD- none, normal carotid impulses w/o bruits Thyroid-  CHEST: min bilateral exp rhonchi  No resp distress HEART: RRR, no m/g/r heard  ABDOMEN: Soft and nl EXT:  No deformities or restrucitons    CXR  10/11/2012 :  Chronic changes in the lungs without acute findings  Labs 10/11/2012 ok including esr  15              Assessment & Plan:

## 2012-11-23 NOTE — Assessment & Plan Note (Signed)
-   HFA 90% p coaching 12/16/2010      - PFT's 06/21/11   FEV1  1.60 (61%) ratio 48 and 10% better p B2,  DLCO 83%     - PFT's 11/22/2012 FEV1  1.52 (62%) arnd 46 and no change p B2 DLCO 89%  Adequate control on present rx, reviewed     Each maintenance medication was reviewed in detail including most importantly the difference between maintenance and as needed and under what circumstances the prns are to be used.  Please see instructions for details which were reviewed in writing and the patient given a copy.

## 2012-11-23 NOTE — Assessment & Plan Note (Signed)
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 > CTX started    - Off all prednisone with baseline esr 31 11/16/2010  > restarted 12/16/2010 with nausea and wt down to 115 but ok sats walking x 3 laps   - Recheck ANCA sent  12/16/2010  >  neg   -Trial off cytoxan starting 02/12/2011 >   03/16/11 stopped - Reduce prednisone to 5 mg one half qd effective 10/11/2012 > qod effective 11/23/2012   The goal with a chronic steroid dependent illness is always arriving at the lowest effective dose that controls the disease/symptoms and not accepting a set "formula" which is based on statistics or guidelines that don't always take into account patient  variability or the natural hx of the dz in every individual patient, which may well vary over time.  For now therefore I recommend the patient maintain  A floor of 5 mg one half qod which is actually less than physiologic dosing and should allow adrenal recovery before stopping completely next ov

## 2012-11-26 ENCOUNTER — Encounter: Payer: Self-pay | Admitting: Family Medicine

## 2012-11-26 ENCOUNTER — Ambulatory Visit (INDEPENDENT_AMBULATORY_CARE_PROVIDER_SITE_OTHER): Payer: Medicare Other | Admitting: Family Medicine

## 2012-11-26 VITALS — BP 106/62 | HR 97 | Temp 98.0°F | Wt 135.0 lb

## 2012-11-26 DIAGNOSIS — R1013 Epigastric pain: Secondary | ICD-10-CM

## 2012-11-26 DIAGNOSIS — K219 Gastro-esophageal reflux disease without esophagitis: Secondary | ICD-10-CM

## 2012-11-26 DIAGNOSIS — K8020 Calculus of gallbladder without cholecystitis without obstruction: Secondary | ICD-10-CM

## 2012-11-26 NOTE — Progress Notes (Signed)
  Subjective:    Patient ID: Richard [name], male    DOB: 01-Mar-1936, 77 y.o.   MRN: [id number]  HPI Here for continuing GI issues. He describes feeling nausea and mild upper abdominal pains each morning when he wakes up and this is sometimes worsened with eating breakfast. No trouble swallowing. He then feels better each afternoon and night. He mentioned this to Dr. Russella Dar in February, and he suggested increasing the Nexium to bid. He has done so with no benefit. His BMs are regular.    Review of Systems  Constitutional: Negative.   Respiratory: Negative.   Cardiovascular: Negative.   Gastrointestinal: Positive for nausea, vomiting and abdominal pain. Negative for diarrhea, constipation, blood in stool, abdominal distention and rectal pain.       Objective:   Physical Exam  Constitutional: He appears well-developed and well-nourished.  Cardiovascular: Normal rate, regular rhythm, normal heart sounds and intact distal pulses.   Pulmonary/Chest: Effort normal and breath sounds normal.  Abdominal: Soft. Bowel sounds are normal. He exhibits no distension and no mass. There is no tenderness. There is no rebound and no guarding.          Assessment & Plan:  We will get an [name] to rule out gall bladder problems. Stay on Nexium.

## 2012-11-26 NOTE — Telephone Encounter (Signed)
Called, spoke with pt. Explained below to him. He verbalized understanding.

## 2012-11-29 ENCOUNTER — Ambulatory Visit
Admission: RE | Admit: 2012-11-29 | Discharge: 2012-11-29 | Disposition: A | Discharge status: Home / Self Care | Payer: Medicare Other | Source: Ambulatory Visit | Attending: Family Medicine | Admitting: Family Medicine

## 2012-11-29 DIAGNOSIS — R1013 Epigastric pain: Secondary | ICD-10-CM

## 2012-11-29 NOTE — Addendum Note (Signed)
Addended by: Gershon Crane A on: 11/29/2012 01:32 PM   Modules accepted: Orders

## 2012-11-30 NOTE — Progress Notes (Signed)
Quick Note:  I spoke with pt ______ 

## 2012-12-05 ENCOUNTER — Telehealth: Payer: Self-pay | Admitting: Family Medicine

## 2012-12-05 NOTE — Telephone Encounter (Signed)
Pls advise.  

## 2012-12-05 NOTE — Telephone Encounter (Signed)
Left message on voicemail to call office.  

## 2012-12-05 NOTE — Telephone Encounter (Signed)
I would suggest he see Dr. [name], his pulmonologist, about this because it does not act like simple bronchitis

## 2012-12-05 NOTE — Telephone Encounter (Signed)
Patient Information:  Caller Name: Luisdaniel  Phone: [phone]  Patient: Richard [name], Richard [name]  Gender: Male  DOB: 1935-10-20  Age: 77 Years  PCP: Gershon Crane [facility])  Office Follow Up:  Does the office need to follow up with this patient?: Yes  Instructions For The Office: Patient is already on medication and has two days left of the Biaxin.  He can't make an office appt today but can be seen late tomorrow if needed.  Does Dr. Clent Ridges want to try another medication. PLEASE CONTACT PATIENT. Pharmacy [facility] [place].  RN Note:  Patient is already on medication and has two days left of the Biaxin.  He can't make an office appt today but can be seen late tomorrow if needed.  Does Dr. Clent Ridges want to try another medication. PLEASE CONTACT PATIENT. Pharmacy [facility] [place].  Symptoms  Reason For Call & Symptoms: Patient states Dr. Clent Ridges had given him some medication for chest congestion. He reports Chronic Bronchitis issues.   He is  almost finished  taking a prescription- clarithromycin (Biaxin) given to him by Dr. Clent Ridges. He feels like he is not any better. +Cough productive-dark yellow, Afebrile, runny nose/sinus issues. Occasionaly wheezing x1 week, but none now  Reviewed Health History In EMR: Yes  Reviewed Medications In EMR: Yes  Reviewed Allergies In EMR: Yes  Reviewed Surgeries / Procedures: Yes  Date of Onset of Symptoms: 11/21/2012  Treatments Tried: Clarithromycin- Biaxin, Hydromet, Symbicort  Treatments Tried Worked: No  Guideline(s) Used:  Cough  Disposition Per Guideline:   See Today in Office  Reason For Disposition Reached:   Known COPD or other severe lung disease (i.e., bronchiectasis, cystic fibrosis, lung surgery) and worsening symptoms (i.e., increased sputum purulence or amount, increased breathing difficulty)  Advice Given:  Cough Medicines:  OTC Cough Drops: Cough drops can help a lot, especially for mild coughs. They reduce coughing by soothing your  irritated throat and removing that tickle sensation in the back of the throat. Cough drops also have the advantage of portability - you can carry them with you.  Home Remedy - Hard Candy: Hard candy works just as well as medicine-flavored OTC cough drops. Diabetics should use sugar-free candy.  Home Remedy - Honey: This old home remedy has been shown to help decrease coughing at night. The adult dosage is 2 teaspoons (10 ml) at bedtime. Honey should not be given to infants under one year of age.  Coughing Spasms:  Drink warm fluids. Inhale warm mist (Reason: both relax the airway and loosen up the phlegm).  Suck on cough drops or hard candy to coat the irritated throat.  Prevent Dehydration:  Drink adequate liquids.  This will help soothe an irritated or dry throat and loosen up the phlegm.  Avoid Tobacco Smoke:  Smoking or being exposed to smoke makes coughs much worse.  Call Back If:  Difficulty breathing  Fever lasts > 3 days  You become worse.  RN Overrode Recommendation:  Follow Up With Office Later  Patient is already on medication and has two days left of the Biaxin.  He can't make an office appt today but can be seen late tomorrow if needed.  Does Dr. Clent Ridges want to try another medication. PLEASE CONTACT PATIENT. Pharmacy [facility] [place].

## 2012-12-06 ENCOUNTER — Telehealth: Payer: Self-pay | Admitting: Family Medicine

## 2012-12-06 ENCOUNTER — Encounter (INDEPENDENT_AMBULATORY_CARE_PROVIDER_SITE_OTHER): Payer: Self-pay

## 2012-12-06 ENCOUNTER — Ambulatory Visit (INDEPENDENT_AMBULATORY_CARE_PROVIDER_SITE_OTHER): Payer: Medicare Other | Admitting: General Surgery

## 2012-12-06 ENCOUNTER — Encounter (INDEPENDENT_AMBULATORY_CARE_PROVIDER_SITE_OTHER): Payer: Self-pay | Admitting: General Surgery

## 2012-12-06 ENCOUNTER — Telehealth: Payer: Self-pay | Admitting: Internal Medicine

## 2012-12-06 VITALS — BP 124/66 | HR 72 | Temp 97.9°F | Resp 16 | Ht 66.0 in | Wt 131.2 lb

## 2012-12-06 DIAGNOSIS — K8010 Calculus of gallbladder with chronic cholecystitis without obstruction: Secondary | ICD-10-CM

## 2012-12-06 NOTE — Telephone Encounter (Signed)
I left voice message with below information. 

## 2012-12-06 NOTE — Progress Notes (Signed)
Subjective:   Abdominal pain and nausea   Patient ID: Richard [name], male   DOB: 02/15/1936, 76 y.o.   MRN: [id number]  HPI  Patient is a pleasant 76-year-old male referred by Dr. Fry for ongoing issues with abdominal pain and nausea. The patient reports an approximately 6 week history of epigastric abdominal pain. This will typically occur in the mornings after he eats breakfast. He then has the onset of aching epigastric pain. This does not radiate. It is associated with nausea but no vomiting. The pain is moderately severe. It didn't go away on its own after about an hour. This occurs almost every day. He also notices some pain in the right side of his back again often in the morning after getting up. No vomiting. No fever chills or jaundice. Bowel movements are normal. He does have a history of GERD. He states the symptoms seem different than his reflux symptoms and increasing his Nexium has not helped. The patient saw Dr. Fry for evaluation and an abdominal ultrasound was obtained showing cholelithiasis as below.  Past Medical History  Diagnosis Date  . Allergy   . GERD (gastroesophageal reflux disease)   . Arthritis   . Asthma   . Fungal infection     lungs  . Cataract   . Ulcer   . BPH (benign prostatic hyperplasia)   . Wegener's granulomatosis 2011    sees Dr. [name]   . Peptic stricture of esophagus   . Anemia   . Hiatal hernia   . Adenomatous colon polyp 08/2011  . Bronchiectasis    Past Surgical History  Procedure Laterality Date  . Hernia repair    . Eye surgery      cataracts removed.   . Colonoscopy  08-16-11    per Dr. Stark, diverticulosis and polyps, repeat in 5 yrs   . Esophagogastroduodenoscopy (egd) with esophageal dilation  11-29-10    per Dr. Stark     Current Outpatient Prescriptions  Medication Sig Dispense Refill  . alum & mag hydroxide-simeth (MAALOX/MYLANTA) 200-200-20 MG/5ML suspension As directed as needed      . aspirin 81 MG tablet Take 81 mg by  mouth daily.        . b complex vitamins tablet Take 1 tablet by mouth daily.        . bisacodyl (DULCOLAX) 5 MG EC tablet Take 5 mg by mouth daily as needed. CONSTIPATION      . bisoprolol (ZEBETA) 5 MG tablet 1/2 tablet daily      . budesonide-formoterol (SYMBICORT) 160-4.5 MCG/ACT inhaler Inhale 2 puffs into the lungs 2 (two) times daily.  1 Inhaler  11  . dextromethorphan-guaiFENesin (MUCINEX DM) 30-600 MG per 12 hr tablet Take 1 tablet by mouth 2 (two) times daily.       . dutasteride (AVODART) 0.5 MG capsule Take 0.5 mg by mouth every other day.      . esomeprazole (NEXIUM) 40 MG capsule Take 1 capsule (40 mg total) by mouth 2 (two) times daily.  60 capsule  11  . predniSONE (DELTASONE) 5 MG tablet One half daily  90 tablet  0  . Simethicone (GAS-X PO) Take 2 tablets by mouth 2 (two) times daily.      . Tamsulosin HCl (FLOMAX) 0.4 MG CAPS TAKE ONE CAPSULE BY MOUTH DAILY  30 capsule  11  . loratadine (CLARITIN) 10 MG tablet Take 10 mg by mouth. Take 1 tablet daily by mouth as needed.         No current facility-administered medications for this visit.   No Known Allergies    Review of Systems  Constitutional: Negative for fever, chills and fatigue.  HENT: Positive for hearing loss. Negative for trouble swallowing.   Eyes: Negative.   Respiratory: Positive for shortness of breath (Exertional, can climb flight of stairs). Negative for cough, choking, wheezing and stridor.   Cardiovascular: Negative.   Gastrointestinal: Positive for nausea and abdominal pain. Negative for vomiting, diarrhea, constipation, blood in stool, anal bleeding and rectal pain.       Has previous EGD, colonoscopy up to date  Genitourinary: Negative.   Musculoskeletal: Negative.        Objective:   Physical Exam BP 124/66  Pulse 72  Temp(Src) 97.9 F (36.6 C) (Temporal)  Resp 16  Ht 5' 6" (1.676 m)  Wt 131 lb 3.2 oz (59.512 kg)  BMI 21.19 kg/m2 General: Alert, Thin Caucasian male, in no distress Skin:  Warm and dry without rash or infection. Somewhat atrophic with some purpura on the arms HEENT: No palpable masses or thyromegaly. Sclera nonicteric. Pupils equal round and reactive. Oropharynx clear. Lymph nodes: No cervical, supraclavicular, or inguinal nodes palpable. Lungs: Breath sounds somewhat coarse but equal without increased work of breathing Cardiovascular: Regular rate and rhythm without murmur. No JVD or edema. Peripheral pulses intact. Abdomen: Nondistended. There is mild tenderness in the epigastrium and right upper quadrant. No masses palpable. No organomegaly. No palpable hernias. Extremities: No edema or joint swelling or deformity. No chronic venous stasis changes. Neurologic: Alert and fully oriented. Gait normal.   Abdominal Ultrasound Comparison: Ultrasound 02/06/2008  Findings:  Gallbladder: Multiple small gallstones under 4 mm. Gallbladder  wall is not thickened. Negative sonographic Murphy's sign  Common bile duct: 2.6 mm  Liver: No focal lesion identified. Within normal limits in  parenchymal echogenicity.  IVC: Appears normal.  Pancreas: No focal abnormality seen.  Spleen: 8.9 cm  Right Kidney: 10.0 cm in length. Negative for obstruction. 4 x 6  mm upper pole nonobstructing stone  Left Kidney: 9.7 cm. Negative for obstruction. 3 nonobstructing  stones are present. 6 mm upper pole stone. 8 mm lower pole stone.  5 x 8 mm mid pole stone.  Abdominal aorta: No aneurysm identified.  IMPRESSION:  Multiple small gallstones  Bilateral nonobstructing renal calculi.  Original Report Authenticated By: David Clark, M.D.     Assessment:     76-year-old male with history of GERD but now new symptoms of postprandial epigastric abdominal pain and nausea not responding to Nexium. He also had some significant but stable chronic lung disease. He has multiple small gallstones on ultrasound and some epigastric tenderness on exam. I suspect he has chronic cholecystitis and his  symptoms are secondary to his gallstones. I have recommended proceeding with laparoscopic cholecystectomy with cholangiogram. I discussed the procedure in detail.  The patient was given educational material.  We discussed the risks and benefits of a laparoscopic cholecystectomy and possible cholangiogram including, but not limited to bleeding, infection, injury to surrounding structures such as the intestine or liver, bile leak, retained gallstones, need to convert to an open procedure, prolonged diarrhea, blood clots such as  DVT, common bile duct injury, anesthesia risks, and possible need for additional procedures.  The likelihood of improvement in symptoms and return to the patient's normal status is good. We discussed the typical post-operative recovery course. I would like to get pulmonary clearance from Dr. [name] prior to scheduling.    Plan:       Laparoscopic cholecystectomy with cholangiogram under general anesthesia as an outpatient. Preoperative pulmonary clearance.      

## 2012-12-06 NOTE — Telephone Encounter (Signed)
Pt returned triage's call.  Holly D Pryor ° °

## 2012-12-06 NOTE — Telephone Encounter (Signed)
Ladarrian/patient returned call to office staff regarding message left 12/05/12 on answering machine.  Caller could not understand the message.  Per Epic, informed Dr Clent Ridges advised to follow up with Dr [name], pulmonologist for ongoing chest congestion.  Caller stated he will call dr [name].

## 2012-12-06 NOTE — Patient Instructions (Addendum)
We will call to schedule your surgery after we check with Dr. [name]

## 2012-12-06 NOTE — Telephone Encounter (Signed)
lmomtcb x1 for pt 

## 2012-12-06 NOTE — Telephone Encounter (Signed)
lmtcb x1 w/ spouse 

## 2012-12-07 ENCOUNTER — Encounter: Payer: Self-pay | Admitting: Pulmonary Disease

## 2012-12-07 ENCOUNTER — Ambulatory Visit (INDEPENDENT_AMBULATORY_CARE_PROVIDER_SITE_OTHER): Payer: Medicare Other | Admitting: Pulmonary Disease

## 2012-12-07 ENCOUNTER — Encounter: Payer: Self-pay | Admitting: Internal Medicine

## 2012-12-07 VITALS — BP 112/68 | HR 87 | Temp 99.1°F | Ht 66.0 in | Wt 130.8 lb

## 2012-12-07 DIAGNOSIS — J471 Bronchiectasis with (acute) exacerbation: Secondary | ICD-10-CM

## 2012-12-07 MED ORDER — PREDNISONE 10 MG PO TABS: 10.0000 mg | ORAL_TABLET | Freq: Every day | ORAL | Status: DC

## 2012-12-07 NOTE — Assessment & Plan Note (Signed)
The patient's history is most consistent with a bronchiectasis acute exacerbation.  Because of his underlying disease, he is colonized with drug-resistant organisms, and will need broad-spectrum antibiotics for treatment.  The patient has done well with Levaquin in the past, and I have asked him to start taking this tonight.  Will also give him a short pulse of his prednisone with his underlying wheezing.  I have stressed to the patient the importance of calling us if his symptoms are not improving, and certainly if he is getting worse.  Currently he is in no respiratory distress.

## 2012-12-07 NOTE — Telephone Encounter (Signed)
Returning call can be reached at [phone].Richard [name]

## 2012-12-07 NOTE — Telephone Encounter (Signed)
Spoke with pt He states having prod cough with purulent sputum x 2 wks  Just finished round of biaxin and is not getting any better Fever for the past several nights OV with KC at 4:15 today, ED sooner if worsens

## 2012-12-07 NOTE — Patient Instructions (Addendum)
Start and finish your levaquin antibiotic. Would increase your prednisone to 40mg  a day for 2 days, then 20mg  a day for 2 days, then 10mg  a day for 2 days, then back to your current prednisone dose. Drink plenty of fluids, completely rest this weekend. Please call us if you are having worsening symptoms.

## 2012-12-07 NOTE — Progress Notes (Signed)
  Subjective:    Patient ID: Richard [name], male    DOB: 02/20/1936, 77 y.o.   MRN: [id number]  HPI The patient comes in today for an acute sick visit.  He has known bronchiectasis and Wegener's granulomatosis, and gives a 1-2 week history of worsening chest congestion, as well as a cough with purulent mucus.  He was given Biaxin by his primary physician last week, but his symptoms have continued to worsen.  He has increased congestion, some shortness of breath, and noted a fever last night greater than 101.  He has not had any lightheadedness or dizziness.   Review of Systems  Constitutional: Positive for fever. Negative for unexpected weight change.  HENT: Positive for congestion, rhinorrhea and postnasal drip. Negative for ear pain, nosebleeds, sore throat, sneezing, trouble swallowing, dental problem and sinus pressure.   Eyes: Negative for redness and itching.  Respiratory: Positive for cough, chest tightness, shortness of breath and wheezing.   Cardiovascular: Positive for chest pain. Negative for palpitations and leg swelling.  Gastrointestinal: Negative for nausea and vomiting.  Genitourinary: Positive for flank pain. Negative for dysuria. Enuresis:  left sided.  Musculoskeletal: Positive for back pain. Negative for joint swelling.  Skin: Negative for rash.  Neurological: Positive for headaches.  Hematological: Does not bruise/bleed easily.  Psychiatric/Behavioral: Negative for dysphoric mood. The patient is not nervous/anxious.        Objective:   Physical Exam Thin male in no acute distress Nose without purulence or discharge noted Oropharynx clear Neck without lymphadenopathy or thyromegaly Chest with scattered crackles pops and a few wheezes. Cardiac exam with regular rate and rhythm Lower extremities without edema, no cyanosis Alert and oriented, moves all 4 extremities.       Assessment & Plan:

## 2012-12-13 ENCOUNTER — Other Ambulatory Visit (INDEPENDENT_AMBULATORY_CARE_PROVIDER_SITE_OTHER): Payer: Self-pay | Admitting: General Surgery

## 2012-12-14 ENCOUNTER — Encounter (HOSPITAL_COMMUNITY): Payer: Self-pay | Admitting: Pharmacy Technician

## 2012-12-18 ENCOUNTER — Encounter (HOSPITAL_COMMUNITY)
Admission: RE | Admit: 2012-12-18 | Discharge: 2012-12-18 | Disposition: A | Discharge status: Home / Self Care | Payer: Medicare Other | Source: Ambulatory Visit | Attending: General Surgery | Admitting: General Surgery

## 2012-12-18 ENCOUNTER — Encounter (HOSPITAL_COMMUNITY): Payer: Self-pay

## 2012-12-18 HISTORY — PX: CATARACT EXTRACTION W/ INTRAOCULAR LENS  IMPLANT, BILATERAL: SHX1307

## 2012-12-18 HISTORY — DX: Unspecified hearing loss, unspecified ear: H91.90

## 2012-12-18 LAB — CBC
HCT: 39.2 % (ref 39.0–52.0)
Hemoglobin: 13.3 g/dL (ref 13.0–17.0)
MCH: 32 pg (ref 26.0–34.0)
MCHC: 33.9 g/dL (ref 30.0–36.0)
RBC: 4.16 MIL/uL — ABNORMAL LOW (ref 4.22–5.81)

## 2012-12-18 LAB — BASIC METABOLIC PANEL
BUN: 19 mg/dL (ref 6–23)
CO2: 27 mEq/L (ref 19–32)
Calcium: 8.8 mg/dL (ref 8.4–10.5)
Glucose, Bld: 95 mg/dL (ref 70–99)
Sodium: 142 mEq/L (ref 135–145)

## 2012-12-18 LAB — SURGICAL PCR SCREEN: Staphylococcus aureus: NEGATIVE

## 2012-12-18 NOTE — Pre-Procedure Instructions (Signed)
12-18-12 EKG done today. CXR 3'14-Epic.

## 2012-12-18 NOTE — Patient Instructions (Signed)
20 Richard [name]  12/18/2012   Your procedure is scheduled on:  5-16 -2014  Report to [facility] at      0530  AM  Call this number if you have problems the morning of surgery: [phone]  Or Presurgical Testing [phone]([name])      Do not eat food:After Midnight.    Take these medicines the morning of surgery with A SIP OF WATER: Zebeta. Mucinex. Nexium. Prednisone. Use Symbicort Inhaler and bring. Stop Aspirin, over the counter supplements x 5 days prior.   Do not wear jewelry, make-up or nail polish.  Do not wear lotions, powders, or perfumes. You may wear deodorant.  Do not shave 12 hours prior to first CHG shower(legs and under arms).(face and neck okay.)  Do not bring valuables to the hospital.  Contacts, dentures or bridgework,body piercing,  may not be worn into surgery.  Leave suitcase in the car. After surgery it may be brought to your room.  For patients admitted to the hospital, checkout time is 11:00 AM the day of discharge.   Patients discharged the day of surgery will not be allowed to drive home. Must have responsible person with you x 24 hours once discharged.  Name and phone number of your driver: [name] -spouse [phone] cell  Special Instructions: CHG(Chlorhedine 4%-"Hibiclens","Betasept","Aplicare") Shower Use Special Wash: see special instructions.(avoid face and genitals)   Please read over the following fact sheets that you were given: MRSA Information.    Failure to follow these instructions may result in Cancellation of your surgery.   Patient signature_______________________________________________________

## 2012-12-21 ENCOUNTER — Encounter (HOSPITAL_COMMUNITY)
Admission: RE | Disposition: A | Discharge status: Home / Self Care | Payer: Self-pay | Source: Ambulatory Visit | Attending: General Surgery

## 2012-12-21 ENCOUNTER — Ambulatory Visit (HOSPITAL_COMMUNITY)
Admission: RE | Admit: 2012-12-21 | Discharge: 2012-12-21 | Disposition: A | Discharge status: Home / Self Care | Payer: Medicare Other | Source: Ambulatory Visit | Attending: General Surgery | Admitting: General Surgery

## 2012-12-21 ENCOUNTER — Encounter (HOSPITAL_COMMUNITY): Payer: Self-pay | Admitting: *Deleted

## 2012-12-21 ENCOUNTER — Ambulatory Visit (HOSPITAL_COMMUNITY): Payer: Medicare Other | Admitting: Anesthesiology

## 2012-12-21 ENCOUNTER — Encounter (HOSPITAL_COMMUNITY): Payer: Self-pay | Admitting: Anesthesiology

## 2012-12-21 ENCOUNTER — Ambulatory Visit (HOSPITAL_COMMUNITY): Payer: Medicare Other

## 2012-12-21 DIAGNOSIS — K8010 Calculus of gallbladder with chronic cholecystitis without obstruction: Secondary | ICD-10-CM

## 2012-12-21 DIAGNOSIS — N200 Calculus of kidney: Secondary | ICD-10-CM | POA: Insufficient documentation

## 2012-12-21 DIAGNOSIS — K8020 Calculus of gallbladder without cholecystitis without obstruction: Secondary | ICD-10-CM | POA: Insufficient documentation

## 2012-12-21 DIAGNOSIS — K660 Peritoneal adhesions (postprocedural) (postinfection): Secondary | ICD-10-CM | POA: Insufficient documentation

## 2012-12-21 DIAGNOSIS — J45909 Unspecified asthma, uncomplicated: Secondary | ICD-10-CM | POA: Insufficient documentation

## 2012-12-21 DIAGNOSIS — Z79899 Other long term (current) drug therapy: Secondary | ICD-10-CM | POA: Insufficient documentation

## 2012-12-21 DIAGNOSIS — K219 Gastro-esophageal reflux disease without esophagitis: Secondary | ICD-10-CM | POA: Insufficient documentation

## 2012-12-21 DIAGNOSIS — R0602 Shortness of breath: Secondary | ICD-10-CM | POA: Insufficient documentation

## 2012-12-21 HISTORY — PX: CHOLECYSTECTOMY: SHX55

## 2012-12-21 SURGERY — LAPAROSCOPIC CHOLECYSTECTOMY WITH INTRAOPERATIVE CHOLANGIOGRAM
Anesthesia: General | Site: Abdomen | Wound class: Clean Contaminated

## 2012-12-21 MED ORDER — PROPOFOL 10 MG/ML IV BOLUS
INTRAVENOUS | Status: DC | PRN
  Administered 2012-12-21: 160 mg via INTRAVENOUS

## 2012-12-21 MED ORDER — FENTANYL CITRATE 0.05 MG/ML IJ SOLN
INTRAMUSCULAR | Status: DC | PRN
  Administered 2012-12-21 (×3): 50 ug via INTRAVENOUS

## 2012-12-21 MED ORDER — BUPIVACAINE-EPINEPHRINE 0.25% -1:200000 IJ SOLN
INTRAMUSCULAR | Status: AC
  Filled 2012-12-21: qty 1

## 2012-12-21 MED ORDER — HYDROCODONE-ACETAMINOPHEN 5-325 MG PO TABS
1.0000 | ORAL_TABLET | ORAL | Status: DC | PRN
  Administered 2012-12-21: 1 via ORAL
  Filled 2012-12-21: qty 1

## 2012-12-21 MED ORDER — CHLORHEXIDINE GLUCONATE 4 % EX LIQD
1.0000 "application " | Freq: Once | CUTANEOUS | Status: DC
  Filled 2012-12-21: qty 15

## 2012-12-21 MED ORDER — LACTATED RINGERS IV SOLN
INTRAVENOUS | Status: DC | PRN
  Administered 2012-12-21: 1000 mL via INTRAUTERINE

## 2012-12-21 MED ORDER — ONDANSETRON HCL 4 MG/2ML IJ SOLN
INTRAMUSCULAR | Status: DC | PRN
  Administered 2012-12-21: 4 mg via INTRAVENOUS

## 2012-12-21 MED ORDER — CEFAZOLIN SODIUM-DEXTROSE 2-3 GM-% IV SOLR
2.0000 g | INTRAVENOUS | Status: AC
  Administered 2012-12-21: 2 g via INTRAVENOUS

## 2012-12-21 MED ORDER — HYDROCORTISONE SOD SUCCINATE 100 MG IJ SOLR
INTRAMUSCULAR | Status: AC
  Filled 2012-12-21: qty 2

## 2012-12-21 MED ORDER — HYDROMORPHONE HCL PF 1 MG/ML IJ SOLN: 0.2500 mg | INTRAMUSCULAR | Status: DC | PRN

## 2012-12-21 MED ORDER — PROMETHAZINE HCL 25 MG/ML IJ SOLN: 6.2500 mg | INTRAMUSCULAR | Status: DC | PRN

## 2012-12-21 MED ORDER — HYDROCORTISONE SOD SUCCINATE 100 MG IJ SOLR
INTRAMUSCULAR | Status: DC | PRN
  Administered 2012-12-21: 100 mg via INTRAVENOUS

## 2012-12-21 MED ORDER — LACTATED RINGERS IV SOLN
INTRAVENOUS | Status: DC | PRN
  Administered 2012-12-21 (×2): via INTRAVENOUS

## 2012-12-21 MED ORDER — LACTATED RINGERS IV SOLN: INTRAVENOUS | Status: DC

## 2012-12-21 MED ORDER — SODIUM CHLORIDE 0.9 % IR SOLN
Status: DC | PRN
  Administered 2012-12-21: 3 mL

## 2012-12-21 MED ORDER — NEOSTIGMINE METHYLSULFATE 1 MG/ML IJ SOLN
INTRAMUSCULAR | Status: DC | PRN
  Administered 2012-12-21: 3 mg via INTRAVENOUS

## 2012-12-21 MED ORDER — ROCURONIUM BROMIDE 100 MG/10ML IV SOLN
INTRAVENOUS | Status: DC | PRN
  Administered 2012-12-21: 20 mg via INTRAVENOUS

## 2012-12-21 MED ORDER — IOHEXOL 300 MG/ML  SOLN
INTRAMUSCULAR | Status: AC
  Filled 2012-12-21: qty 1

## 2012-12-21 MED ORDER — SUCCINYLCHOLINE CHLORIDE 20 MG/ML IJ SOLN
INTRAMUSCULAR | Status: DC | PRN
  Administered 2012-12-21: 100 mg via INTRAVENOUS

## 2012-12-21 MED ORDER — LIDOCAINE HCL (CARDIAC) 20 MG/ML IV SOLN
INTRAVENOUS | Status: DC | PRN
  Administered 2012-12-21: 100 mg via INTRAVENOUS

## 2012-12-21 MED ORDER — IOHEXOL 300 MG/ML  SOLN
INTRAMUSCULAR | Status: DC | PRN
  Administered 2012-12-21: 3 mL via INTRAVENOUS

## 2012-12-21 MED ORDER — BUPIVACAINE-EPINEPHRINE 0.25% -1:200000 IJ SOLN
INTRAMUSCULAR | Status: DC | PRN
  Administered 2012-12-21: 15 mL

## 2012-12-21 MED ORDER — GLYCOPYRROLATE 0.2 MG/ML IJ SOLN
INTRAMUSCULAR | Status: DC | PRN
  Administered 2012-12-21: 0.4 mg via INTRAVENOUS

## 2012-12-21 MED ORDER — ACETAMINOPHEN 10 MG/ML IV SOLN
INTRAVENOUS | Status: DC | PRN
  Administered 2012-12-21: 1000 mg via INTRAVENOUS

## 2012-12-21 MED ORDER — HYDROCODONE-ACETAMINOPHEN 5-325 MG PO TABS: 1.0000 | ORAL_TABLET | ORAL | Status: DC | PRN

## 2012-12-21 MED ORDER — EPHEDRINE SULFATE 50 MG/ML IJ SOLN
INTRAMUSCULAR | Status: DC | PRN
  Administered 2012-12-21: 10 mg via INTRAVENOUS

## 2012-12-21 SURGICAL SUPPLY — 46 items
ADH SKN CLS APL DERMABOND .7 (GAUZE/BANDAGES/DRESSINGS)
APL SKNCLS STERI-STRIP NONHPOA (GAUZE/BANDAGES/DRESSINGS)
APPLIER CLIP ROT 10 11.4 M/L (STAPLE) ×2
APR CLP MED LRG 11.4X10 (STAPLE) ×1
BAG SPEC RTRVL LRG 6X4 10 (ENDOMECHANICALS) ×1
BENZOIN TINCTURE PRP APPL 2/3 (GAUZE/BANDAGES/DRESSINGS) IMPLANT
CANISTER SUCTION 2500CC (MISCELLANEOUS) ×2 IMPLANT
CATH REDDICK CHOLANGI 4FR 50CM (CATHETERS) IMPLANT
CHLORAPREP W/TINT 26ML (MISCELLANEOUS) ×2 IMPLANT
CLIP APPLIE ROT 10 11.4 M/L (STAPLE) ×1 IMPLANT
CLOTH BEACON ORANGE TIMEOUT ST (SAFETY) ×2 IMPLANT
COVER MAYO STAND STRL (DRAPES) ×2 IMPLANT
DECANTER SPIKE VIAL GLASS SM (MISCELLANEOUS) ×2 IMPLANT
DERMABOND ADVANCED (GAUZE/BANDAGES/DRESSINGS)
DERMABOND ADVANCED .7 DNX12 (GAUZE/BANDAGES/DRESSINGS) IMPLANT
DRAPE C-ARM 42X72 X-RAY (DRAPES) ×2 IMPLANT
DRAPE LAPAROSCOPIC ABDOMINAL (DRAPES) ×2 IMPLANT
DRAPE UTILITY XL STRL (DRAPES) ×2 IMPLANT
ELECT REM PT RETURN 9FT ADLT (ELECTROSURGICAL) ×2
ELECTRODE REM PT RTRN 9FT ADLT (ELECTROSURGICAL) ×1 IMPLANT
GLOVE BIOGEL PI IND STRL 7.0 (GLOVE) ×1 IMPLANT
GLOVE BIOGEL PI INDICATOR 7.0 (GLOVE) ×1
GLOVE SS BIOGEL STRL SZ 7.5 (GLOVE) ×1 IMPLANT
GLOVE SUPERSENSE BIOGEL SZ 7.5 (GLOVE) ×5
GOWN STRL NON-REIN LRG LVL3 (GOWN DISPOSABLE) ×2 IMPLANT
GOWN STRL REIN XL XLG (GOWN DISPOSABLE) ×4 IMPLANT
HEMOSTAT SURGICEL 4X8 (HEMOSTASIS) IMPLANT
IV CATH 14GX2 1/4 (CATHETERS) IMPLANT
IV SET EXT 30 76VOL 4 MALE LL (IV SETS) IMPLANT
KIT BASIN OR (CUSTOM PROCEDURE TRAY) ×2 IMPLANT
NS IRRIG 1000ML POUR BTL (IV SOLUTION) ×2 IMPLANT
POUCH SPECIMEN RETRIEVAL 10MM (ENDOMECHANICALS) ×1 IMPLANT
SET CHOLANGIOGRAPH MIX (MISCELLANEOUS) ×2 IMPLANT
SET IRRIG TUBING LAPAROSCOPIC (IRRIGATION / IRRIGATOR) ×2 IMPLANT
SOLUTION ANTI FOG 6CC (MISCELLANEOUS) ×2 IMPLANT
STOPCOCK K 69 2C6206 (IV SETS) IMPLANT
STRIP CLOSURE SKIN 1/2X4 (GAUZE/BANDAGES/DRESSINGS) IMPLANT
SUT MNCRL AB 4-0 PS2 18 (SUTURE) ×2 IMPLANT
TOWEL OR 17X26 10 PK STRL BLUE (TOWEL DISPOSABLE) ×2 IMPLANT
TRAY LAP CHOLE (CUSTOM PROCEDURE TRAY) ×2 IMPLANT
TROCAR SLEEVE XCEL 5X75 (ENDOMECHANICALS) ×4 IMPLANT
TROCAR XCEL BLADELESS 5X75MML (TROCAR) ×2 IMPLANT
TROCAR XCEL BLUNT TIP 100MML (ENDOMECHANICALS) ×2 IMPLANT
TROCAR XCEL NON-BLD 11X100MML (ENDOMECHANICALS) ×2 IMPLANT
TROCAR Z-THREAD FIOS 5X100MM (TROCAR) IMPLANT
TUBING INSUFFLATION 10FT LAP (TUBING) ×2 IMPLANT

## 2012-12-21 NOTE — Anesthesia Postprocedure Evaluation (Signed)
  Anesthesia Post-op Note  Patient: Richard [name]  Procedure(s) Performed: Procedure(s) (LRB): LAPAROSCOPIC CHOLECYSTECTOMY WITH INTRAOPERATIVE CHOLANGIOGRAM (N/A)  Patient Location: PACU  Anesthesia Type: General  Level of Consciousness: awake and alert   Airway and Oxygen Therapy: Patient Spontanous Breathing  Post-op Pain: mild  Post-op Assessment: Post-op Vital signs reviewed, Patient's Cardiovascular Status Stable, Respiratory Function Stable, Patent Airway and No signs of Nausea or vomiting  Last Vitals:  Filed Vitals:   12/21/12 1026  BP: 128/55  Pulse: 69  Temp:   Resp:     Post-op Vital Signs: stable   Complications: No apparent anesthesia complications

## 2012-12-21 NOTE — Transfer of Care (Signed)
Immediate Anesthesia Transfer of Care Note  Patient: Richard [name]  Procedure(s) Performed: Procedure(s) (LRB): LAPAROSCOPIC CHOLECYSTECTOMY WITH INTRAOPERATIVE CHOLANGIOGRAM (N/A)  Patient Location: PACU  Anesthesia Type: General  Level of Consciousness: sedated, patient cooperative and responds to stimulaton  Airway & Oxygen Therapy: Patient Spontanous Breathing and Patient connected to face mask oxgen  Post-op Assessment: Report given to PACU RN and Post -op Vital signs reviewed and stable  Post vital signs: Reviewed and stable  Complications: No apparent anesthesia complications

## 2012-12-21 NOTE — H&P (View-Only) (Signed)
Subjective:   Abdominal pain and nausea   Patient ID: Richard [name], male   DOB: 1935/09/17, 77 y.o.   MRN: [id number]  HPI  Patient is a pleasant 77 year old male referred by Dr. Clent Ridges for ongoing issues with abdominal pain and nausea. The patient reports an approximately 6 week history of epigastric abdominal pain. This will typically occur in the mornings after he eats breakfast. He then has the onset of aching epigastric pain. This does not radiate. It is associated with nausea but no vomiting. The pain is moderately severe. It didn't go away on its own after about an hour. This occurs almost every day. He also notices some pain in the right side of his back again often in the morning after getting up. No vomiting. No fever chills or jaundice. Bowel movements are normal. He does have a history of GERD. He states the symptoms seem different than his reflux symptoms and increasing his Nexium has not helped. The patient saw Dr. Clent Ridges for evaluation and an abdominal ultrasound was obtained showing cholelithiasis as below.  Past Medical History  Diagnosis Date  . Allergy   . GERD (gastroesophageal reflux disease)   . Arthritis   . Asthma   . Fungal infection     lungs  . Cataract   . Ulcer   . BPH (benign prostatic hyperplasia)   . Wegener's granulomatosis 2011    sees Dr. [name]   . Peptic stricture of esophagus   . Anemia   . Hiatal hernia   . Adenomatous colon polyp 08/2011  . Bronchiectasis    Past Surgical History  Procedure Laterality Date  . Hernia repair    . Eye surgery      cataracts removed.   . Colonoscopy  08-16-11    per Dr. Russella Dar, diverticulosis and polyps, repeat in 5 yrs   . Esophagogastroduodenoscopy (egd) with esophageal dilation  11-29-10    per Dr. Russella Dar     Current Outpatient Prescriptions  Medication Sig Dispense Refill  . alum & mag hydroxide-simeth (MAALOX/MYLANTA) 200-200-20 MG/5ML suspension As directed as needed      . aspirin 81 MG tablet Take 81 mg by  mouth daily.        Marland Kitchen b complex vitamins tablet Take 1 tablet by mouth daily.        . bisacodyl (DULCOLAX) 5 MG EC tablet Take 5 mg by mouth daily as needed. CONSTIPATION      . bisoprolol (ZEBETA) 5 MG tablet 1/2 tablet daily      . budesonide-formoterol (SYMBICORT) 160-4.5 MCG/ACT inhaler Inhale 2 puffs into the lungs 2 (two) times daily.  1 Inhaler  11  . dextromethorphan-guaiFENesin (MUCINEX DM) 30-600 MG per 12 hr tablet Take 1 tablet by mouth 2 (two) times daily.       Marland Kitchen dutasteride (AVODART) 0.5 MG capsule Take 0.5 mg by mouth every other day.      . esomeprazole (NEXIUM) 40 MG capsule Take 1 capsule (40 mg total) by mouth 2 (two) times daily.  60 capsule  11  . predniSONE (DELTASONE) 5 MG tablet One half daily  90 tablet  0  . Simethicone (GAS-X PO) Take 2 tablets by mouth 2 (two) times daily.      . Tamsulosin HCl (FLOMAX) 0.4 MG CAPS TAKE ONE CAPSULE BY MOUTH DAILY  30 capsule  11  . loratadine (CLARITIN) 10 MG tablet Take 10 mg by mouth. Take 1 tablet daily by mouth as needed.  No current facility-administered medications for this visit.   No Known Allergies    Review of Systems  Constitutional: Negative for fever, chills and fatigue.  HENT: Positive for hearing loss. Negative for trouble swallowing.   Eyes: Negative.   Respiratory: Positive for shortness of breath (Exertional, can climb flight of stairs). Negative for cough, choking, wheezing and stridor.   Cardiovascular: Negative.   Gastrointestinal: Positive for nausea and abdominal pain. Negative for vomiting, diarrhea, constipation, blood in stool, anal bleeding and rectal pain.       Has previous EGD, colonoscopy up to date  Genitourinary: Negative.   Musculoskeletal: Negative.        Objective:   Physical Exam BP 124/66  Pulse 72  Temp(Src) 97.9 F (36.6 C) (Temporal)  Resp 16  Ht 5\' 6"  (1.676 m)  Wt 131 lb 3.2 oz (59.512 kg)  BMI 21.19 kg/m2 General: Alert, Thin Caucasian male, in no distress Skin:  Warm and dry without rash or infection. Somewhat atrophic with some purpura on the arms HEENT: No palpable masses or thyromegaly. Sclera nonicteric. Pupils equal round and reactive. Oropharynx clear. Lymph nodes: No cervical, supraclavicular, or inguinal nodes palpable. Lungs: Breath sounds somewhat coarse but equal without increased work of breathing Cardiovascular: Regular rate and rhythm without murmur. No JVD or edema. Peripheral pulses intact. Abdomen: Nondistended. There is mild tenderness in the epigastrium and right upper quadrant. No masses palpable. No organomegaly. No palpable hernias. Extremities: No edema or joint swelling or deformity. No chronic venous stasis changes. Neurologic: Alert and fully oriented. Gait normal.   Abdominal Ultrasound Comparison: Ultrasound 02/06/2008  Findings:  Gallbladder: Multiple small gallstones under 4 mm. Gallbladder  wall is not thickened. Negative sonographic Murphy's sign  Common bile duct: 2.6 mm  Liver: No focal lesion identified. Within normal limits in  parenchymal echogenicity.  IVC: Appears normal.  Pancreas: No focal abnormality seen.  Spleen: 8.9 cm  Right Kidney: 10.0 cm in length. Negative for obstruction. 4 x 6  mm upper pole nonobstructing stone  Left Kidney: 9.7 cm. Negative for obstruction. 3 nonobstructing  stones are present. 6 mm upper pole stone. 8 mm lower pole stone.  5 x 8 mm mid pole stone.  Abdominal aorta: No aneurysm identified.  IMPRESSION:  Multiple small gallstones  Bilateral nonobstructing renal calculi.  Original Report Authenticated By: Janeece Riggers, M.D.     Assessment:     76 year old male with history of GERD but now new symptoms of postprandial epigastric abdominal pain and nausea not responding to Nexium. He also had some significant but stable chronic lung disease. He has multiple small gallstones on ultrasound and some epigastric tenderness on exam. I suspect he has chronic cholecystitis and his  symptoms are secondary to his gallstones. I have recommended proceeding with laparoscopic cholecystectomy with cholangiogram. I discussed the procedure in detail.  The patient was given Agricultural engineer.  We discussed the risks and benefits of a laparoscopic cholecystectomy and possible cholangiogram including, but not limited to bleeding, infection, injury to surrounding structures such as the intestine or liver, bile leak, retained gallstones, need to convert to an open procedure, prolonged diarrhea, blood clots such as  DVT, common bile duct injury, anesthesia risks, and possible need for additional procedures.  The likelihood of improvement in symptoms and return to the patient's normal status is good. We discussed the typical post-operative recovery course. I would like to get pulmonary clearance from Dr. [name] prior to scheduling.    Plan:  Laparoscopic cholecystectomy with cholangiogram under general anesthesia as an outpatient. Preoperative pulmonary clearance.

## 2012-12-21 NOTE — Op Note (Signed)
Preoperative diagnosis: Cholelithiasis and cholecystitis  Postoperative diagnosis: Cholelithiasis and cholecystitis  Surgical procedure: Laparoscopic cholecystectomy with intraoperative cholangiogram  Surgeon: Sharlet Salina T. Oktober Glazer M.D.  Assistant: none  Anesthesia: General Endotracheal  Complications: None  Estimated blood loss: Minimal  Description of procedure: The patient brought to the operating room, placed in the supine position on the operating table, and general endotracheal anesthesia induced. The abdomen was widely sterilely prepped and draped. The patient had received preoperative IV antibiotics and PAS were in place. Patient timeout was performed the correct procedure verified. Standard 4 port technique was used with an open Hassan cannula at the umbilicus and the remainder of the ports placed under direct vision. The gallbladder was visualized. It appeared Mildly inflammed with some omental adhesions. The fundus was grasped and elevated up over the liver and the infundibulum retracted inferiolaterally. Peritoneum anterior and posterior to close triangle was incised and fibrofatty tissue stripped off the neck of the gallbladder toward the porta hepatis. The distal gallbladder was thoroughly dissected. The cystic artery was identified in close triangle and the cystic duct gallbladder junction dissected 360.  A good critical view was obtained. When the anatomy was clear the cystic duct was clipped at the gallbladder junction and an operative cholangiogram obtained through the cystic duct. This showed good filling of a normal common bile duct and intrahepatic ducts with free flow into the duodenum and no filling defects. Following this the Cholangiocath was removed and the cystic duct was doubly clipped proximally and divided. The cystic artery was doubly clipped proximally and distally and divided. The gallbladder was dissected free from its bed using hook cautery and removed through the  umbilical port site. Complete hemostasis was obtained in the gallbladder bed. The right upper quadrant was thoroughly irrigated and hemostasis assured. Trochars were removed and all CO2 evacuated and the [facility] trocar site fascial defect closed. Skin incisions were closed with subcuticular Monocryl and Dermabond. Sponge needle and instrument counts were correct. The patient was taken to PACU in good condition.  Media Pizzini T  12/21/2012

## 2012-12-21 NOTE — Interval H&P Note (Signed)
History and Physical Interval Note:  12/21/2012 7:23 AM  Richard [name]  has presented today for surgery, with the diagnosis of cholelitiasis  The various methods of treatment have been discussed with the patient and family. After consideration of risks, benefits and other options for treatment, the patient has consented to  Procedure(s): LAPAROSCOPIC CHOLECYSTECTOMY WITH INTRAOPERATIVE CHOLANGIOGRAM (N/A) as a surgical intervention .  The patient's history has been reviewed, patient examined, no change in status, stable for surgery.  I have reviewed the patient's chart and labs.  Questions were answered to the patient's satisfaction.     Remus Hagedorn T

## 2012-12-21 NOTE — Anesthesia Preprocedure Evaluation (Addendum)
Anesthesia Evaluation  Patient identified by MRN, date of birth, ID band Patient awake    Reviewed: Allergy & Precautions, H&P , NPO status , Patient's Chart, lab work & pertinent test results  Airway Mallampati: II TM Distance: >3 FB Neck ROM: Full    Dental   Missing some right upper rear teeth.:   Pulmonary asthma ,  Wegener's granulomatosis. Recent pulmonologist visit 12-07-12. He states that his lung function is back to his normal baseline.  CXR 10-11-12 reviewed. Chronic changes. breath sounds clear to auscultation Productive cough noted.        Cardiovascular negative cardio ROS  Rhythm:Regular Rate:Normal  ECG reviewed: NSR, possible anterior infarct.   Neuro/Psych  Neuromuscular disease negative psych ROS   GI/Hepatic Neg liver ROS, hiatal hernia, GERD-  Medicated,  Endo/Other  Hypothyroidism   Renal/GU negative Renal ROS  negative genitourinary   Musculoskeletal negative musculoskeletal ROS (+)   Abdominal   Peds negative pediatric ROS (+)  Hematology negative hematology ROS (+)   Anesthesia Other Findings   Reproductive/Obstetrics negative OB ROS                       Anesthesia Physical Anesthesia Plan  ASA: III  Anesthesia Plan: General   Post-op Pain Management:    Induction: Intravenous  Airway Management Planned: Oral ETT  Additional Equipment:   Intra-op Plan:   Post-operative Plan: Extubation in OR  Informed Consent: I have reviewed the patients History and Physical, chart, labs and discussed the procedure including the risks, benefits and alternatives for the proposed anesthesia with the patient or authorized representative who has indicated his/her understanding and acceptance.   Dental advisory given  Plan Discussed with: CRNA  Anesthesia Plan Comments: (Increased pulmonary risk discussed with patient.)        Anesthesia Quick Evaluation

## 2012-12-24 ENCOUNTER — Encounter (HOSPITAL_COMMUNITY): Payer: Self-pay | Admitting: General Surgery

## 2012-12-26 ENCOUNTER — Encounter: Payer: Self-pay | Admitting: Internal Medicine

## 2012-12-26 ENCOUNTER — Other Ambulatory Visit: Payer: Self-pay | Admitting: Internal Medicine

## 2012-12-26 DIAGNOSIS — J9610 Chronic respiratory failure, unspecified whether with hypoxia or hypercapnia: Secondary | ICD-10-CM

## 2012-12-28 ENCOUNTER — Ambulatory Visit (INDEPENDENT_AMBULATORY_CARE_PROVIDER_SITE_OTHER): Payer: Medicare Other | Admitting: General Surgery

## 2012-12-28 VITALS — BP 121/72 | HR 68 | Temp 97.9°F | Resp 16 | Ht 66.0 in | Wt 129.4 lb

## 2012-12-28 DIAGNOSIS — Z09 Encounter for follow-up examination after completed treatment for conditions other than malignant neoplasm: Secondary | ICD-10-CM

## 2012-12-28 NOTE — Progress Notes (Signed)
History: Patient returns for his first postop check one week following laparoscopic cholecystectomy.  He has had no particular problems. His incisional soreness is steadily improving and is getting back to normal activities. He feels his epigastric pain he was having about every morning is relieved although he still feels nauseated in the mornings. No vomiting.  Exam: BP 121/72  Pulse 68  Temp(Src) 97.9 F (36.6 C) (Temporal)  Resp 16  Ht 5\' 6"  (1.676 m)  Wt 129 lb 6.4 oz (58.695 kg)  BMI 20.9 kg/m2 General: Appears well Abdomen: Soft and nontender. Incisions all healing well.  Pathology confirmed cholelithiasis and chronic cholecystitis  Assessment and plan: Doing well for laparoscopic cholecystectomy. No complications. He has had relief of his pain but still has some nausea. This may still be some mild ileus recovering from the surgery. I asked them to call me in 3-4 weeks to let me know how he is doing. If his nausea resolves we do not need to see him but if it is persistent I asked them to schedule another appointment and we would investigate that.

## 2012-12-28 NOTE — Patient Instructions (Signed)
Call and let us know how you're doing in 3-4 weeks. If you are still having nausea I would like to see you back in the office for another appointment.

## 2013-01-10 IMAGING — CR DG CHEST 2V
2 series · 2 of 2 positions shown · non-contrast
Comparison: 11/28/2011

CLINICAL DATA: Follow up Wegener's granulomatosis

CHEST - 2 VIEW

[view not recorded (1 of 2)]
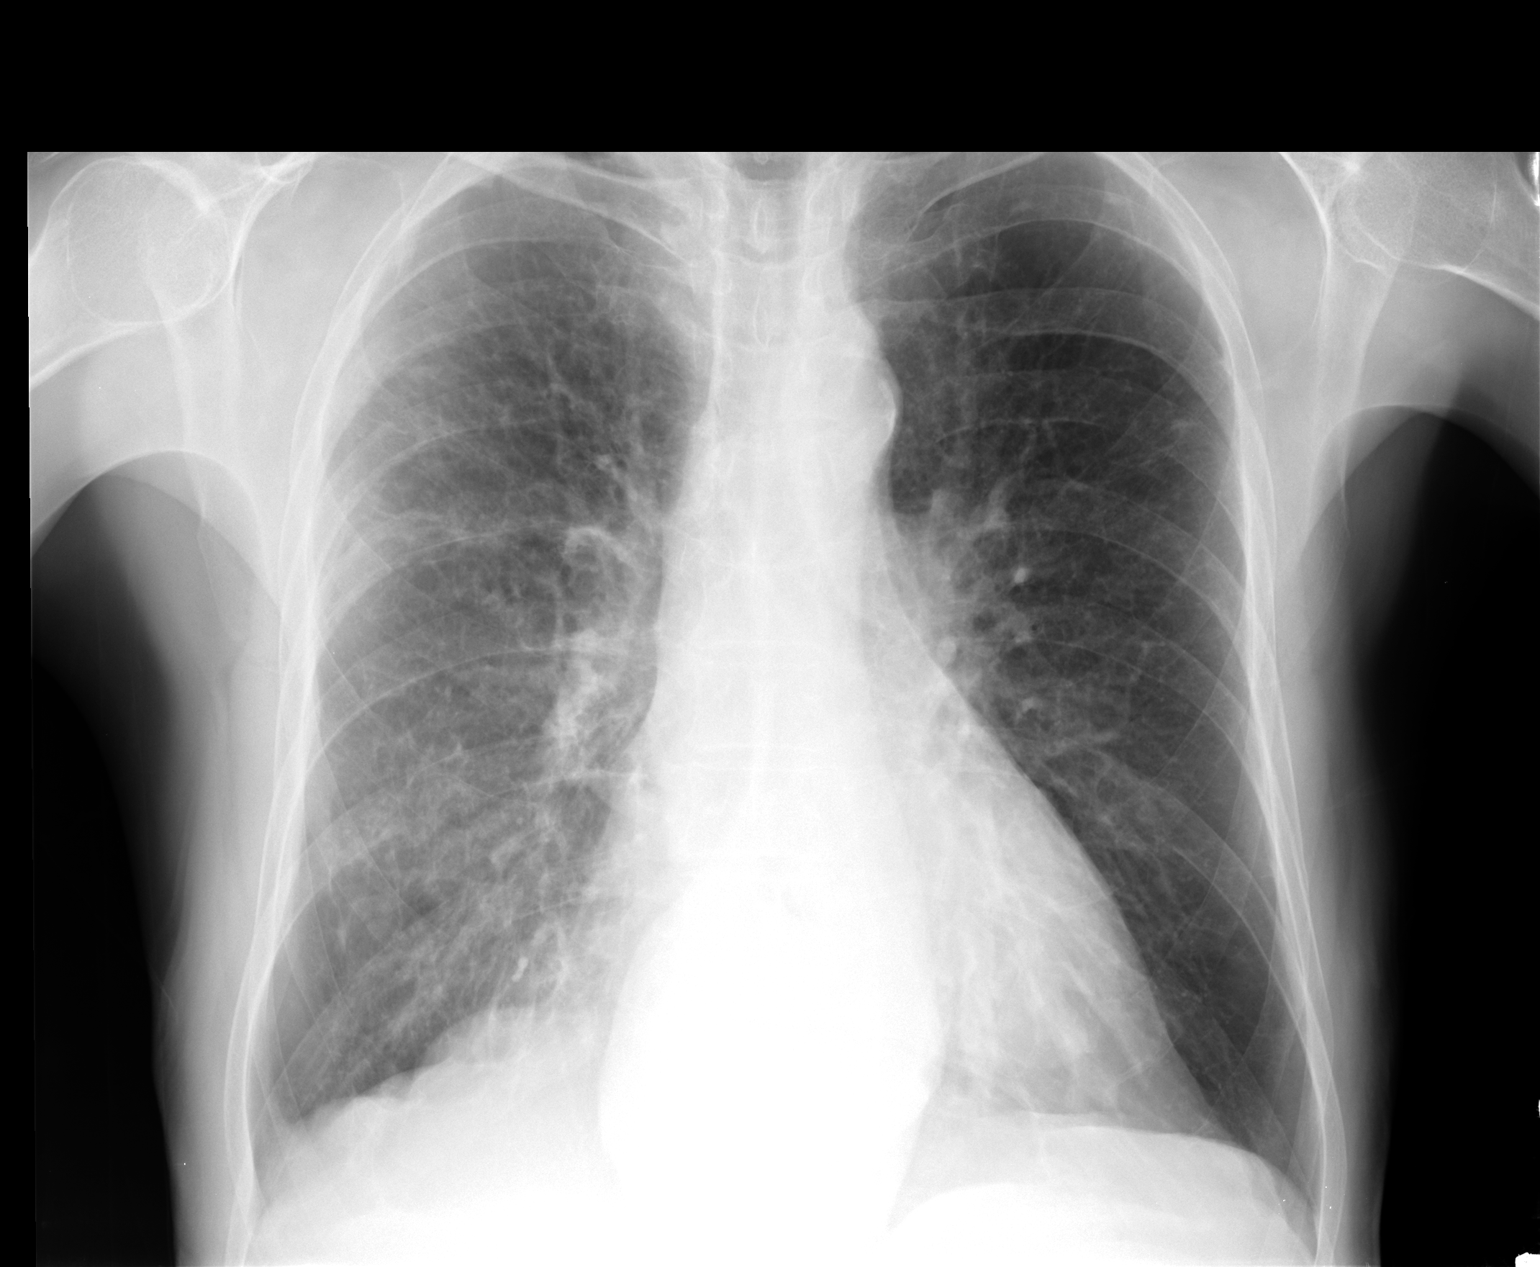

[view not recorded (2 of 2)]
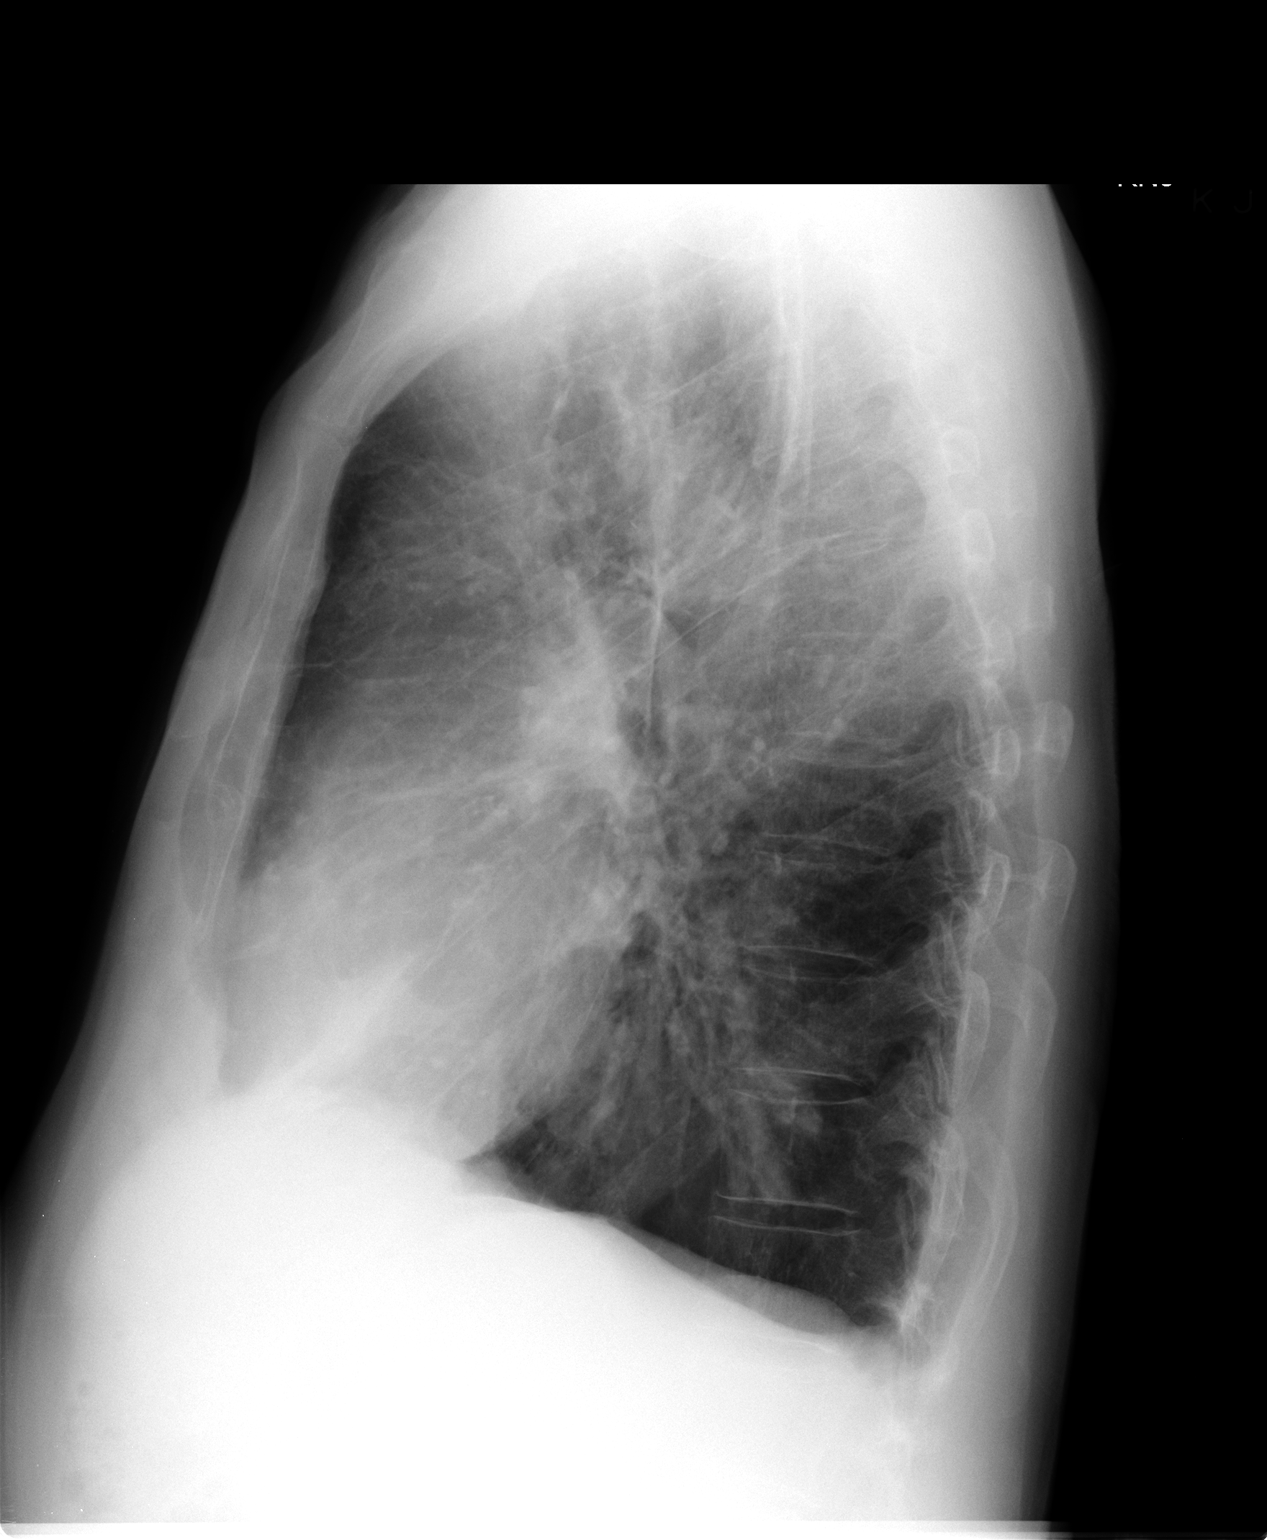

[2 of 2 positions shown; findings below may reference images not displayed]

FINDINGS: Cardiomediastinal silhouette is stable.  Stable
reticulonodular interstitial prominence right greater than left.
No focal infiltrate or pulmonary edema.  Bony thorax is stable.
Mild hyperinflation again noted.
IMPRESSION: No active disease.  No significant change.

## 2013-01-14 ENCOUNTER — Encounter: Payer: Self-pay | Admitting: Internal Medicine

## 2013-02-06 ENCOUNTER — Other Ambulatory Visit: Payer: Self-pay | Admitting: Internal Medicine

## 2013-02-06 ENCOUNTER — Telehealth: Payer: Self-pay | Admitting: Internal Medicine

## 2013-02-06 NOTE — Telephone Encounter (Signed)
RX has already been sent in earlier today according to the pharmacy. Nothing further was needed

## 2013-02-19 ENCOUNTER — Ambulatory Visit (INDEPENDENT_AMBULATORY_CARE_PROVIDER_SITE_OTHER)
Admission: RE | Admit: 2013-02-19 | Discharge: 2013-02-19 | Disposition: A | Discharge status: Home / Self Care | Payer: Medicare Other | Source: Ambulatory Visit | Attending: Internal Medicine | Admitting: Internal Medicine

## 2013-02-19 ENCOUNTER — Encounter: Payer: Self-pay | Admitting: Internal Medicine

## 2013-02-19 ENCOUNTER — Ambulatory Visit (INDEPENDENT_AMBULATORY_CARE_PROVIDER_SITE_OTHER): Payer: Medicare Other | Admitting: Internal Medicine

## 2013-02-19 VITALS — BP 114/70 | HR 74 | Temp 97.1°F | Ht 66.0 in | Wt 132.2 lb

## 2013-02-19 DIAGNOSIS — M3130 Wegener's granulomatosis without renal involvement: Secondary | ICD-10-CM

## 2013-02-19 DIAGNOSIS — J479 Bronchiectasis, uncomplicated: Secondary | ICD-10-CM

## 2013-02-19 MED ORDER — PREDNISONE 10 MG PO TABS: 10.0000 mg | ORAL_TABLET | Freq: Every day | ORAL | Status: DC

## 2013-02-19 NOTE — Patient Instructions (Addendum)
When wheezing or coughing is worse, increase prednisone to 20 mg daily until better, then 10 mg day x 3 days and 5 mg per day x 3 days then 2.5 mg per day thereafter.  mucinex dose is 600 take 2 every 12 hours as needed for cough/ congestion/ thick mucus  Please remember to go to the x-ray department downstairs for your tests - we will call you with the results when they are available.  Please schedule a follow up visit in 3 months but call sooner if needed

## 2013-02-19 NOTE — Progress Notes (Signed)
Subjective:    Patient ID: Richard [name], male    DOB: 12/08/1935, 77 y.o.   MRN: [id number]  Brief patient profile:  35  yowm never smoker with dx of Longstanding bronchiectasis then dx with WG 03/2010 > requested establish with Anthonny Schiller.   Admit [facility] dx WG by pos c anca 03/2010, supportive tbbx but not vats   Admit [name] dx spont L Ptx 9/08/15/09 requiring Chest tube but no surgery   05/24/10--NP ov/ med reivew. Since last visit. has been hospitalized 05/12/2010-- 10/10/2011for Right pneumothorax., with underlying Wegener granulomatosis. and . Bronchiectasis. Pt was found to have Right Pneumo. Chest tube was placed. with improvement and removed 10/7. Xray on 10/10 w/ resolved pnuemo. He was continued on cytoxan and bactrim for PCP prophylaxis. rec. Prednisone 20 mg reduce to one half daily  Aspirin 81 mg one daily with breakfast but stop for any bleeding  See Patient Care Coordinator before leaving for cardiology appt > saw Dr [name], ? LHC needed     October 13, 2010 --Presents for an acute office visit. Complains of productive cough with yellow mucus, sore throat, rattling in chest, low grade temp x3days. Currently on pred 1/2 every other day . OTC not helping. rec continue qod pred, rx with augmentin x 10 days   11/16/2010 ov all smiles, no change on days of prednisone, minimal discolored sputum.   Try off prednisone completely.  If you feel a lot worse over the next  week(worse breathing/ coughing/ nausea or loss of appetite) resume prednisone 5 mg one half daily until better then return back one half every other day    12/16/2010 ov/[name] cc no better on 5mg  dosed one half every other day (not the dose rec), still nauseated, still discolored p doxy complete May 7, sob in shower but not as much walking and no longer using 02, struggling with contingency plans. rec Work on inhaler technique:  If short of breath go ahead and use the xopenex hfa 2 puffs every 4 hours  Prednisone 5 mg 4 daily until  better, then 2 daily x 5 days, then 1 daily thereafter   12/31/10 ov/[name], exac of bronchiectasis/wheeze no hemoptysis rx with pred burst and levaquin 750 x 5d  01/13/2011 ov/[name]  Cc sob and cough better now tapered prednisone to 5 mg each am using xop once daily around noon no problem with sleep or am exac of cough.   rec  02/11/2011 [name] cc recurrent hemoptysis on top of yellow that usually comes up esp in am x one year but no epistaxis, no cp or arthralgias or increased doe. rec Levaquin 750 mg x 5 days courses whenever mucus turns bloody, more dark or with high fever. Stop cytoxan and sulfasoxazole  Don't take aspirin if any active bleeding   03/16/2011 f/u ov/[name] cc no more blood,  Minimal yellow mucus, back on asa 81 without hemoptysis. No sob rec  Try prednisone 5 mg  One on even and a half on odd   Acute Visit 04/26/11 -[name]  Cc  increased chest congestion, yellow sputum beginning about 1 week ago. He took a levaquin x 5 days (9/11 - 9/15). Has a sore throat, getting better. Nasal drainage and sinus drainage worse over last 3 -4 days. He tells me that he cut grass last Thursday, may have started this. No sick contacts.  rec Please start levaquin daily for another 5 days Continue your current prednisone dosing  Start loratadine 10mg  daily until your next  visit    05/27/2011 f/u ov/[name] on 2.5 mg pred /day  cc still coughing, congested though much less purulent sputum p levaquin and no hemoptysis.  Does note fatigue > baseline  With doe worse x garbage cans to street and back and has not tried the xopenex at all. rec Try using xopenex as needed for short of breath Only take the loratidine (clariton) if needed for itching sneezing runny nose     07/19/2011 ER follow up 07/15/11 with increased cough, congestion and fever. Dx with acute bronchiectasis and possible influenza. He has started on Levaquin 750mg  x 5 days w/ last day today. Also given Tamiflu but did not fill this.  Fevers are decreasing. Cough and congesiton are improved but not gone. Still feels weak.CXR w/o acute changes.  Labs from ER reviewed with elevated WBC 11k, w/ left shift. Neg strep test. No influenza data found.  rec Extend Levaquin daily for 5 days  Mucinex DM Twice daily  As needed  Cough/congestion  Fluids and rest  Tylenol As needed  Fever   08/24/2011 f/u ov/[name] on Prednisone 5 mg one half each am  cc cough/ congestion back to baseline, no limiting sob. rec Try prednisone 5 mg one half pill alternate days to see if any worse respiratory symptoms or nausea, fatigue get worse Ok to adjust the mucinex dm to take up to 2 every 12 hours as needed For itching sneezing and runny nose, use the loratidine 10 mg (clariton)   OV 09/28/2011 ACUTE OFFICE VISIT WITH DR [name] with flare cough  rec Please take Take prednisone 40mg  once daily x 3 days, then 30mg  once daily x 3 days, then 20mg  once daily x 3 days, then prednisone 10mg  once daily  x 3 days and then 5mg  daily x 3 days and then to your baseline regimen of alternating  11/28/2011 f/u ov/[name] cc much better on higher doses of prednisone but worse w/in 5 days of floor of qod regimen with increase cough/ congestion minimally discolored sputum no epistaxis hemoptysis or aches/ pains x vague discomfort r ant chest under breast.  No hematuria rec Prednisone 20 mg per day until better, then 10 mg per day x 5 days, and then 5 mg per day.    05/31/2012 f/u ov/[name] cc no change in cough, yellowish, thick, worse in ams,  not  Bloody- no epistaxis, rash, arthralgias or sob.   rec Try prednisone 5 mg  One on even and one-half  on odd - if no change then 2 weeks before your next scheduled pulmonary visit take one half every day   10/11/2012 f/u ov/[name] still on one on even and half on odd prednisone(did not taper as per instructions, though no flare in symptoms), rare need for levaquin rec Prednisone 5 mg pill take one half daily  11/23/2012 f/u  ov/[name] re WG/ bronchiectasis Chief Complaint  Patient presents with  . Follow-up    Had PFT today-sob same,cough-yellow,wheezing occass.,  no hemoptysis, no real limiting sob though sedentary, no arthralgias or rash maintaining 2.5 mg daily No change prednisone 5 mg one half daily  > ok to try every other day if doing great   May 1 flare rx levaquin and pred May 16 lap chole [facility]worth   02/19/2013 f/u ov/Arber Wiemers re WG and bronchietasis still on 5 mg one half daily  Chief Complaint  Patient presents with  . Follow-up    Pt c/o increased cough x 2 wks- prod with minimal yellow sputum.  He also has some increased SOB since increased cough started.   symptoms came on gradually with min proression and no change rx yet > has saba but not using.  Cough better with mucinex. No hemoptysis or epistaxis. Sob only with exertion  No obvious daytime variabilty or assoc  cp or chest tightness, subjective wheeze overt sinus or hb symptoms. No unusual exp hx     Sleeping ok without nocturnal  or early am exacerbation  of respiratory  c/o's or need for noct saba. Also denies any obvious fluctuation of symptoms with weather or environmental changes or other aggravating or alleviating factors except as outlined above   ROS  The following are not active complaints unless bolded sore throat, dysphagia, dental problems, itching, sneezing,  nasal congestion or excess/ purulent secretions, ear ache,   fever, chills, sweats, unintended wt loss, pleuritic or exertional cp, hemoptysis,  orthopnea pnd or leg swelling, presyncope, palpitations, heartburn, abdominal pain, anorexia, nausea, vomiting, diarrhea  or change in bowel or urinary habits, change in stools or urine, dysuria,hematuria,  rash, arthralgias, visual complaints, headache, numbness weakness or ataxia or problems with walking or coordination,  change in mood/affect or memory.           Objective:   Physical Exam wt   128  04/26/11 >07/08/2011  134> 134  11/28/2011 > 02/29/2012 136> 05/31/2012  136>  134 11/22/12  amb thin pleasant wm nad  SKIN: no rash, lesions  NODES: no lymphadenopathy  HEENT: [place]/AT, EOM- WNL, Conjuctivae- clear, PERRLA, TM-WNL, Nose- clear, Throat- clear and wnl, Mallampati III  NECK: Supple w/ fair ROM, JVD- none, normal carotid impulses w/o bruits Thyroid-  CHEST: min bilateral exp rhonchi  No resp distress HEART: RRR, no m/g/r heard  ABDOMEN: Soft and nl EXT:  No deformities or restrucitons    CXR  02/19/2013 :   Stable chronic changes. No acute abnormality identified              Assessment & Plan:

## 2013-02-20 NOTE — Progress Notes (Signed)
Quick Note:  Spoke with pt and notified of results per Dr. [name]. Pt verbalized understanding and denied any questions.  ______ 

## 2013-02-21 ENCOUNTER — Ambulatory Visit: Payer: Medicare Other | Admitting: Internal Medicine

## 2013-02-21 NOTE — Assessment & Plan Note (Addendum)
-   HFA 90% p coaching 12/16/2010      - PFT's 06/21/11   FEV1  1.60 (61%) ratio 48 and 10% better p B2,  DLCO 83%     - PFT's 11/22/2012 FEV1  1.52 (62%) arnd 46 and no change p B2 DLCO 89%   Mild flare s significant wheeze in pt who has steroid dep symptoms >  rx   Prednisone but hold levquin for more purulent sputum  The goal with a chronic steroid dependent illness is always arriving at the lowest effective dose that controls the disease/symptoms and not accepting a set "formula" which is based on statistics or guidelines that don't always take into account patient  variability or the natural hx of the dz in every individual patient, which may well vary over time.  For now therefore I recommend the patient maintain  Ceiling of 20 mg and a floor of 5 mg one half  Daily

## 2013-02-21 NOTE — Assessment & Plan Note (Signed)
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 > CTX started    - Off all prednisone with baseline esr 31 11/16/2010  > restarted 12/16/2010 with nausea and wt down to 115 but ok sats walking x 3 laps   - Recheck ANCA sent  12/16/2010  >  neg   -Trial off cytoxan starting 02/12/2011 >   03/16/11 stopped - Reduce prednisone to 5 mg one half qd effective 10/11/2012   Strongly doubt dz activity is "steroid" dep as the problem is all airway related 2 years p last dose of CTX > rx as bronchiectasis/"copd", not WG

## 2013-03-13 ENCOUNTER — Other Ambulatory Visit: Payer: Self-pay

## 2013-03-21 ENCOUNTER — Other Ambulatory Visit: Payer: Self-pay | Admitting: Family Medicine

## 2013-04-07 ENCOUNTER — Telehealth: Payer: Self-pay | Admitting: Internal Medicine

## 2013-04-07 NOTE — Telephone Encounter (Signed)
On call- Chronic bronchitis/ bronchiectasis. Has used refillable Rx Levaquin. Recent exacerbation- ended levaquin 1 week ago. Already flaring chest congestion, cough prod yellow, no fever. Has script biaxin from PCP.  Plan- fill script biaxin. Call office as needed next week after holiday weekend. Keep appt Dr [name].

## 2013-04-12 ENCOUNTER — Telehealth: Payer: Self-pay | Admitting: Internal Medicine

## 2013-04-12 MED ORDER — LEVOFLOXACIN 750 MG PO TABS: 750.0000 mg | ORAL_TABLET | Freq: Every day | ORAL | Status: DC

## 2013-04-12 NOTE — Telephone Encounter (Signed)
Pt c/o prod cough (yellow), increased sob and weakness for past week.  PCP prescribed Clarithromycin 500mg  bid for 10 days.  Pt has 4 days left but is not feeling better.  Pt wants to know if he soul switch to Levaquin or what would Dr [name] recommend.  Pt just finished course of Levaquin 2 weeks ago.  Please advise

## 2013-04-12 NOTE — Telephone Encounter (Signed)
Yes, do levaquin x 10days then ov with me at 2 weeks (at same dose he used previously)

## 2013-04-12 NOTE — Telephone Encounter (Signed)
Spoke with pt and advised of Dr Thurston Hole recommendations.  Levaquin rx sent to pharmacy.  F/U appt made with Dr [name].

## 2013-04-26 ENCOUNTER — Ambulatory Visit (INDEPENDENT_AMBULATORY_CARE_PROVIDER_SITE_OTHER): Payer: Medicare Other | Admitting: Internal Medicine

## 2013-04-26 ENCOUNTER — Encounter: Payer: Self-pay | Admitting: Internal Medicine

## 2013-04-26 VITALS — BP 108/62 | HR 65 | Temp 97.9°F | Ht 66.0 in | Wt 131.6 lb

## 2013-04-26 DIAGNOSIS — J479 Bronchiectasis, uncomplicated: Secondary | ICD-10-CM

## 2013-04-26 DIAGNOSIS — Z23 Encounter for immunization: Secondary | ICD-10-CM

## 2013-04-26 DIAGNOSIS — M3130 Wegener's granulomatosis without renal involvement: Secondary | ICD-10-CM

## 2013-04-26 MED ORDER — LEVALBUTEROL TARTRATE 45 MCG/ACT IN AERO: 1.0000 | INHALATION_SPRAY | RESPIRATORY_TRACT | Status: DC | PRN

## 2013-04-26 MED ORDER — GEMIFLOXACIN MESYLATE 320 MG PO TABS: 320.0000 mg | ORAL_TABLET | Freq: Every day | ORAL | Status: DC

## 2013-04-26 MED ORDER — TRAMADOL HCL 50 MG PO TABS: ORAL_TABLET | ORAL | Status: DC

## 2013-04-26 NOTE — Progress Notes (Signed)
Subjective:    Patient ID: Richard [name], male    DOB: 20-Jan-1936, 77 y.o.   MRN: [id number]  Brief patient profile:  47  yowm never smoker with dx of Longstanding bronchiectasis then dx with WG 03/2010 > requested establish with [name].   Admit [facility] dx WG by pos c anca 03/2010, supportive tbbx but not vats   Admit [name] dx spont L Ptx 9/08/15/09 requiring Chest tube but no surgery   05/24/10--NP ov/ med reivew. Since last visit. has been hospitalized 05/12/2010-- 10/10/2011for Right pneumothorax., with underlying Wegener granulomatosis. and . Bronchiectasis. Pt was found to have Right Pneumo. Chest tube was placed. with improvement and removed 10/7. Xray on 10/10 w/ resolved pnuemo. He was continued on cytoxan and bactrim for PCP prophylaxis. rec. Prednisone 20 mg reduce to one half daily  Aspirin 81 mg one daily with breakfast but stop for any bleeding  See Patient Care Coordinator before leaving for cardiology appt > saw Dr [name], ? LHC needed     October 13, 2010 --Presents for an acute office visit. Complains of productive cough with yellow mucus, sore throat, rattling in chest, low grade temp x3days. Currently on pred 1/2 every other day . OTC not helping. rec continue qod pred, rx with augmentin x 10 days   11/16/2010 ov all smiles, no change on days of prednisone, minimal discolored sputum.   Try off prednisone completely.  If you feel a lot worse over the next  week(worse breathing/ coughing/ nausea or loss of appetite) resume prednisone 5 mg one half daily until better then return back one half every other day    12/16/2010 ov/[name] cc no better on 5mg  dosed one half every other day (not the dose rec), still nauseated, still discolored p doxy complete May 7, sob in shower but not as much walking and no longer using 02, struggling with contingency plans. rec Work on inhaler technique:  If short of breath go ahead and use the xopenex hfa 2 puffs every 4 hours  Prednisone 5 mg 4 daily until  better, then 2 daily x 5 days, then 1 daily thereafter   12/31/10 ov/[name], exac of bronchiectasis/wheeze no hemoptysis rx with pred burst and levaquin 750 x 5d  01/13/2011 ov/[name]  Cc sob and cough better now tapered prednisone to 5 mg each am using xop once daily around noon no problem with sleep or am exac of cough.   rec  02/11/2011 [name] cc recurrent hemoptysis on top of yellow that usually comes up esp in am x one year but no epistaxis, no cp or arthralgias or increased doe. rec Levaquin 750 mg x 5 days courses whenever mucus turns bloody, more dark or with high fever. Stop cytoxan and sulfasoxazole  Don't take aspirin if any active bleeding   03/16/2011 f/u ov/[name] cc no more blood,  Minimal yellow mucus, back on asa 81 without hemoptysis. No sob rec  Try prednisone 5 mg  One on even and a half on odd   Acute Visit 04/26/11 -Richard [name]  Cc  increased chest congestion, yellow sputum beginning about 1 week ago. He took a levaquin x 5 days (9/11 - 9/15). Has a sore throat, getting better. Nasal drainage and sinus drainage worse over last 3 -4 days. He tells me that he cut grass last Thursday, may have started this. No sick contacts.  rec Please start levaquin daily for another 5 days Continue your current prednisone dosing  Start loratadine 10mg  daily until your next  visit    05/27/2011 f/u ov/[name] on 2.5 mg pred /day  cc still coughing, congested though much less purulent sputum p levaquin and no hemoptysis.  Does note fatigue > baseline  With doe worse x garbage cans to street and back and has not tried the xopenex at all. rec Try using xopenex as needed for short of breath Only take the loratidine (clariton) if needed for itching sneezing runny nose     07/19/2011 ER follow up 07/15/11 with increased cough, congestion and fever. Dx with acute bronchiectasis and possible influenza. He has started on Levaquin 750mg  x 5 days w/ last day today. Also given Tamiflu but did not fill this.  Fevers are decreasing. Cough and congesiton are improved but not gone. Still feels weak.CXR w/o acute changes.  Labs from ER reviewed with elevated WBC 11k, w/ left shift. Neg strep test. No influenza data found.  rec Extend Levaquin daily for 5 days  Mucinex DM Twice daily  As needed  Cough/congestion  Fluids and rest  Tylenol As needed  Fever   08/24/2011 f/u ov/[name] on Prednisone 5 mg one half each am  cc cough/ congestion back to baseline, no limiting sob. rec Try prednisone 5 mg one half pill alternate days to see if any worse respiratory symptoms or nausea, fatigue get worse Ok to adjust the mucinex dm to take up to 2 every 12 hours as needed For itching sneezing and runny nose, use the loratidine 10 mg (clariton)   OV 09/28/2011 ACUTE OFFICE VISIT WITH DR [name] with flare cough  rec Please take Take prednisone 40mg  once daily x 3 days, then 30mg  once daily x 3 days, then 20mg  once daily x 3 days, then prednisone 10mg  once daily  x 3 days and then 5mg  daily x 3 days and then to your baseline regimen of alternating  11/28/2011 f/u ov/[name] cc much better on higher doses of prednisone but worse w/in 5 days of floor of qod regimen with increase cough/ congestion minimally discolored sputum no epistaxis hemoptysis or aches/ pains x vague discomfort r ant chest under breast.  No hematuria rec Prednisone 20 mg per day until better, then 10 mg per day x 5 days, and then 5 mg per day.    05/31/2012 f/u ov/[name] cc no change in cough, yellowish, thick, worse in ams,  not  Bloody- no epistaxis, rash, arthralgias or sob.   rec Try prednisone 5 mg  One on even and one-half  on odd - if no change then 2 weeks before your next scheduled pulmonary visit take one half every day   10/11/2012 f/u ov/[name] still on one on even and half on odd prednisone(did not taper as per instructions, though no flare in symptoms), rare need for levaquin rec Prednisone 5 mg pill take one half daily  11/23/2012 f/u  ov/[name] re WG/ bronchiectasis Chief Complaint  Patient presents with  . Follow-up    Had PFT today-sob same,cough-yellow,wheezing occass.,  no hemoptysis, no real limiting sob though sedentary, no arthralgias or rash maintaining 2.5 mg daily No change prednisone 5 mg one half daily  > ok to try every other day if doing great May 1 flare rx levaquin and pred May 16 lap chole [facility]   02/19/2013 f/u ov/[name] re WG and bronchietasis still on 5 mg one half daily  Chief Complaint  Patient presents with  . Follow-up    Pt c/o increased cough x 2 wks- prod with minimal yellow sputum. He also  has some increased SOB since increased cough started.   symptoms came on gradually with min proression and no change rx yet > has saba but not using.  Cough better with mucinex. No hemoptysis or epistaxis. Sob only with exertion rec When wheezing or coughing is worse, increase prednisone to 20 mg daily until better, then 10 mg day x 3 days and 5 mg per day x 3 days then 2.5 mg per day thereafter. mucinex dose is 600 take 2 every 12 hours as needed for cough/ congestion/ thick mucus   04/26/2013 f/u ov/[name] re: WG/ bronchiectasis p completed 10 days of levaquin 750 on 9/14  Chief Complaint  Patient presents with  . Follow-up    Breathing better. cough w/ yellow tint phlem.    Mucus never Never bloody and much lighter p levaquin, no need for saba on symb 160 2bid - wife concerned levaquin resistance developing  No obvious daytime variabilty or assoc  Sob cp or chest tightness, subjective wheeze overt sinus or hb symptoms. No unusual exp hx     Sleeping ok without nocturnal  or early am exacerbation  of respiratory  c/o's or need for noct saba. Also denies any obvious fluctuation of symptoms with weather or environmental changes or other aggravating or alleviating factors except as outlined above   ROS  The following are not active complaints unless bolded sore throat, dysphagia, dental problems, itching,  sneezing,  nasal congestion or excess/ purulent secretions, ear ache,   fever, chills, sweats, unintended wt loss, pleuritic or exertional cp, hemoptysis,  orthopnea pnd or leg swelling, presyncope, palpitations, heartburn, abdominal pain, anorexia, nausea, vomiting, diarrhea  or change in bowel or urinary habits, change in stools or urine, dysuria,hematuria,  rash, arthralgias, visual complaints, headache, numbness weakness or ataxia or problems with walking or coordination,  change in mood/affect or memory.           Objective:   Physical Exam wt   128  04/26/11 >07/08/2011  134> 134 11/28/2011 > 02/29/2012 136> 05/31/2012  136>  134 11/22/12  amb thin pleasant wm nad  SKIN: no rash, lesions  NODES: no lymphadenopathy  HEENT: [place]/AT, EOM- WNL, Conjuctivae- clear, PERRLA, TM-WNL, Nose- clear, Throat- clear and wnl, Mallampati III  NECK: Supple w/ fair ROM, JVD- none, normal carotid impulses w/o bruits Thyroid-  CHEST: min bilateral exp rhonchi  No resp distress HEART: RRR, no m/g/r heard  ABDOMEN: Soft and nl EXT:  No deformities or restrucitons    CXR  02/19/2013 :   Stable chronic changes. No acute abnormality identified              Assessment & Plan:

## 2013-04-26 NOTE — Patient Instructions (Addendum)
factive one daily x 5 days next time mucus gets nasty  Prednisone ceiling is 10 mg per day and floor is 5 mg daily   Only use your xopenex as a rescue medication to be used if you can't catch your breath by resting or doing a relaxed purse lip breathing pattern. The less you use it, the better it will work when you need it.   See calendar for specific medication instructions and bring it back for each and every office visit for every healthcare provider you see.  Without it,  you may not receive the best quality medical care that we feel you deserve.  You will note that the calendar groups together  your maintenance  medications that are timed at particular times of the day.  Think of this as your checklist for what your doctor has instructed you to do until your next evaluation to see what benefit  there is  to staying on a consistent group of medications intended to keep you well.  The other group at the bottom is entirely up to you to use as you see fit  for specific symptoms that may arise between visits that require you to treat them on an as needed basis.  Think of this as your action plan or "what if" list.   Separating the top medications from the bottom group is fundamental to providing you adequate care going forward.    Please schedule a follow up visit in 3 months but call sooner if needed

## 2013-04-28 NOTE — Assessment & Plan Note (Signed)
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 > CTX started    - Off all prednisone with baseline esr 31 11/16/2010  > restarted 12/16/2010 with nausea and wt down to 115 but ok sats walking x 3 laps   - Recheck ANCA sent  12/16/2010  >  neg   -Trial off cytoxan starting 02/12/2011 >   03/16/11 stopped   The goal with a chronic steroid dependent illness is always arriving at the lowest effective dose that controls the disease/symptoms and not accepting a set "formula" which is based on statistics or guidelines that don't always take into account patient  variability or the natural hx of the dz in every individual patient, which may well vary over time.  For now therefore I recommend the patient maintain    - Prednisone ceiling is 10 mg per day and floor is 5 mg daily as of 04/26/13  (trial and error has proven can't get by on < 5 mg without flare of symptoms though not clearly directly related to [facility])

## 2013-04-28 NOTE — Assessment & Plan Note (Signed)
-   HFA 90% p coaching 12/16/2010      - PFT's 06/21/11   FEV1  1.60 (61%) ratio 48 and 10% better p B2,  DLCO 83%     - PFT's 11/22/2012 FEV1  1.52 (62%) arnd 46 and no change p B2 DLCO 89%   Adequate control on present rx, reviewed > no change in rx needed    Each maintenance medication was reviewed in detail including most importantly the difference between maintenance and as needed and under what circumstances the prns are to be used. This was done in the context of a medication calendar review which provided the patient with a user-friendly unambiguous mechanism for medication administration and reconciliation and provides an action plan for all active problems. It is critical that this be shown to every doctor  for modification during the office visit if necessary so the patient can use it as a working document.      Agree levaquin resistance may become an issue > try factive x 5 days cycles

## 2013-04-30 ENCOUNTER — Telehealth: Payer: Self-pay | Admitting: Internal Medicine

## 2013-04-30 NOTE — Telephone Encounter (Signed)
[employer] back-- 7-10 min hold time Entered our office number to have return call from company

## 2013-05-01 NOTE — Telephone Encounter (Signed)
Called and spoke with Rep at [facility] and was notified that factive was approved  Called and made [facility] aware  Pt also informed Nothing further needed

## 2013-05-14 ENCOUNTER — Other Ambulatory Visit: Payer: Self-pay | Admitting: Internal Medicine

## 2013-06-16 ENCOUNTER — Other Ambulatory Visit: Payer: Self-pay | Admitting: Family Medicine

## 2013-06-18 ENCOUNTER — Telehealth: Payer: Self-pay | Admitting: Internal Medicine

## 2013-06-18 MED ORDER — LEVOFLOXACIN 750 MG PO TABS: 750.0000 mg | ORAL_TABLET | Freq: Every day | ORAL | Status: DC

## 2013-06-18 NOTE — Telephone Encounter (Signed)
Pt c/o flare of bronchiectasis for past week.  C/o prod cough (yellow) and increase sob.  Pt is going out of town later this week and is requesting a rx for Levaquin 750 mg # 10 that MW has prescribed in the past.  [facility] [address]  Please advise No Known Allergies

## 2013-06-18 NOTE — Telephone Encounter (Signed)
Ok

## 2013-06-18 NOTE — Telephone Encounter (Signed)
rx sent and pt is aware. Joury Allcorn, CMA  

## 2013-07-11 ENCOUNTER — Telehealth: Payer: Self-pay | Admitting: Internal Medicine

## 2013-07-11 IMAGING — US US ABDOMEN COMPLETE
1 series · 14 of 25 positions shown · non-contrast
Comparison: Ultrasound 02/06/2008

CLINICAL DATA: Epigastric pain

COMPLETE ABDOMINAL ULTRASOUND

[Series 1: us abdomen complete · 0.35mm/px · 14 of 106 slices shown]
[im 1/106]
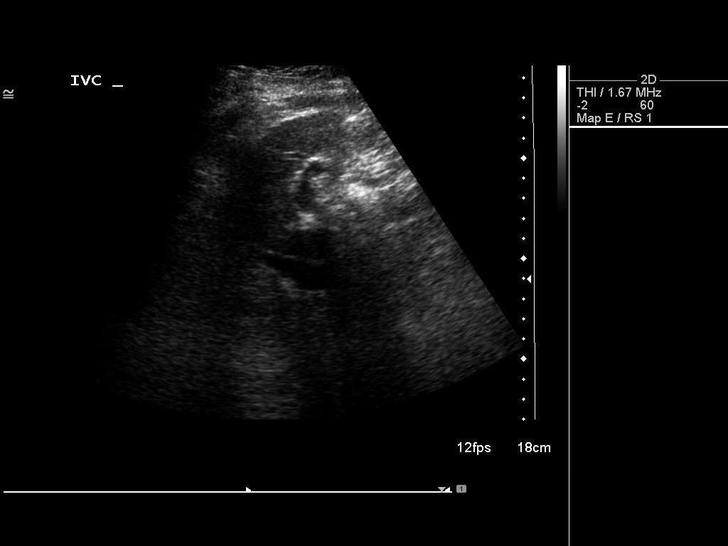
[im 9/106]
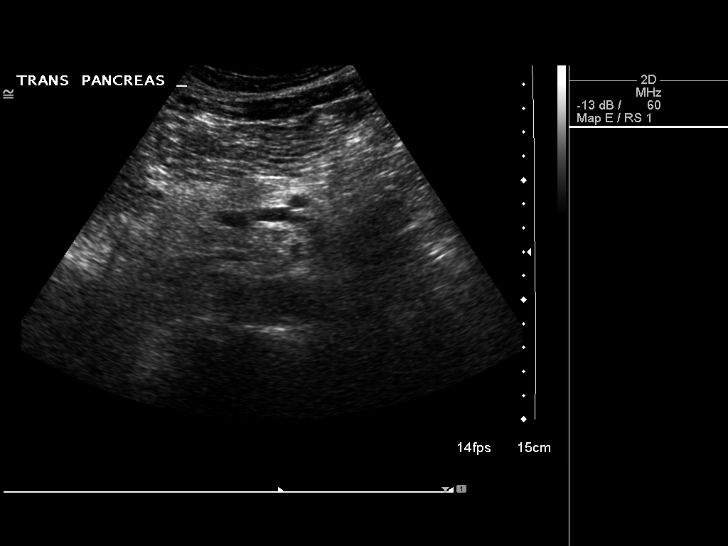
[im 18/106]
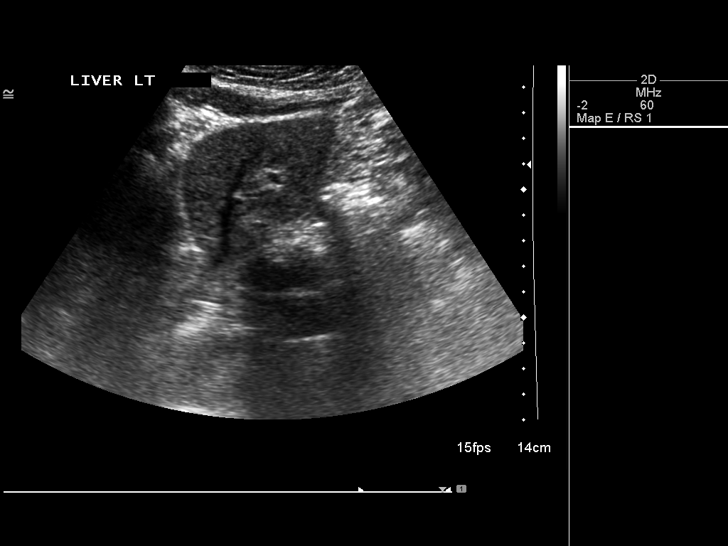
[im 27/106]
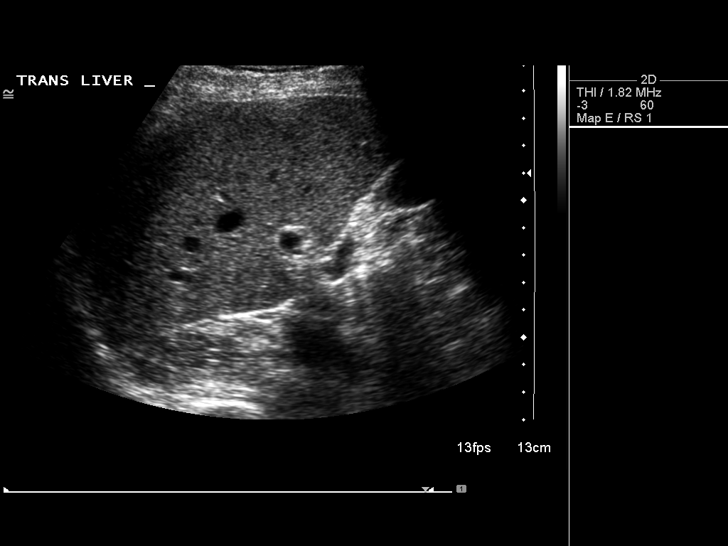
[im 36/106]
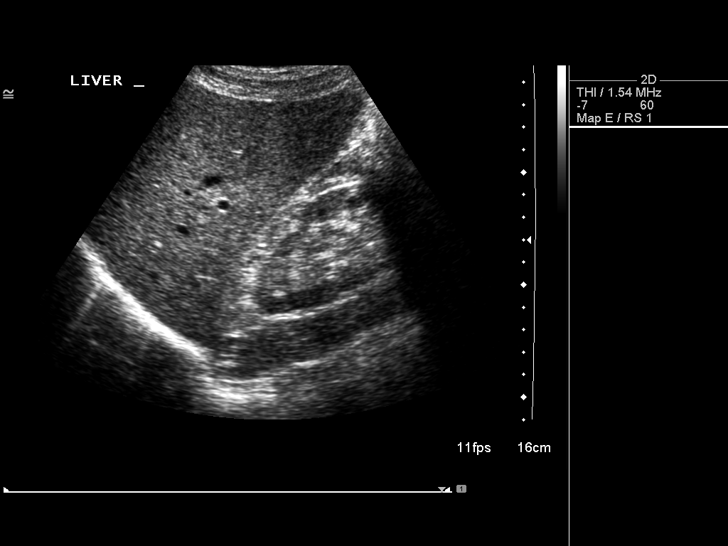
[im 40/106]
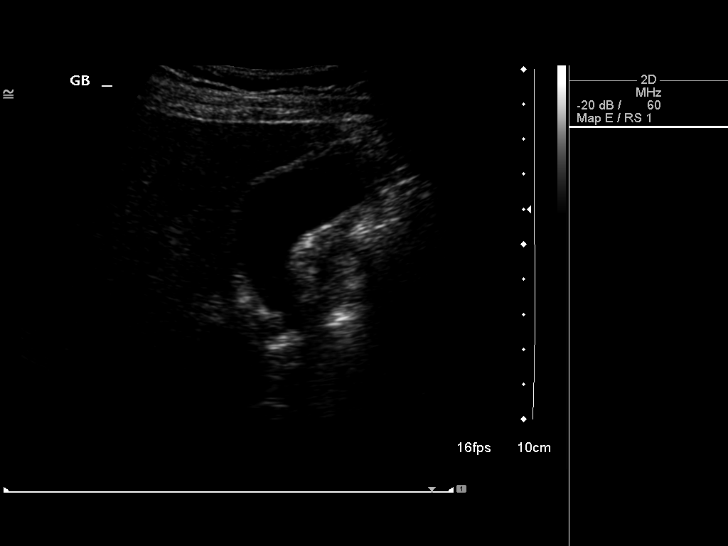
[im 49/106]
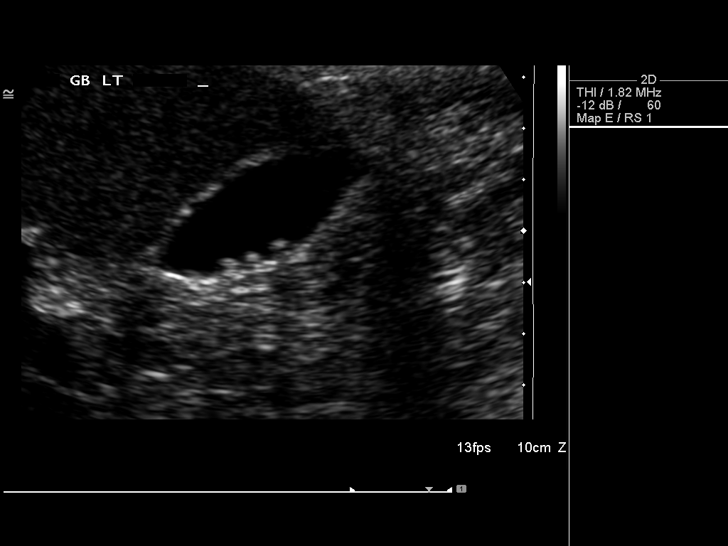
[im 57/106]
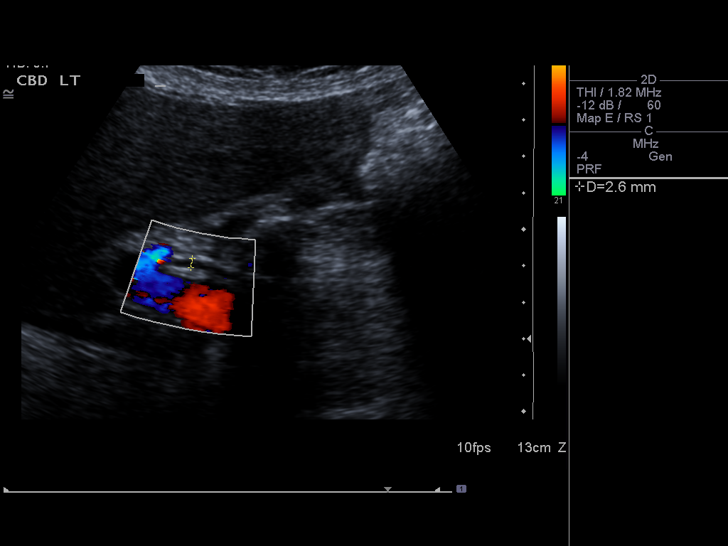
[im 66/106]
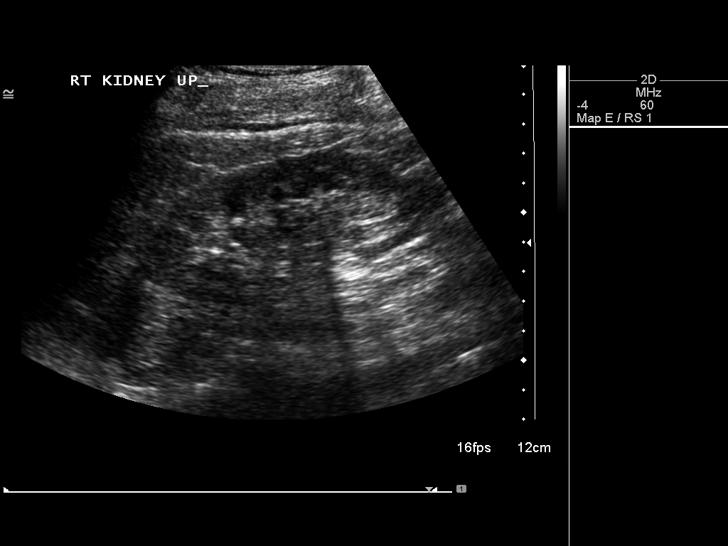
[im 71/106]
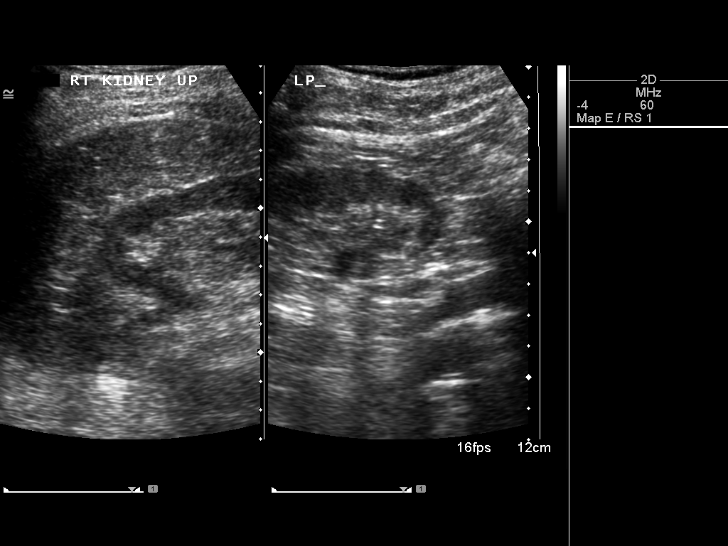
[im 79/106]
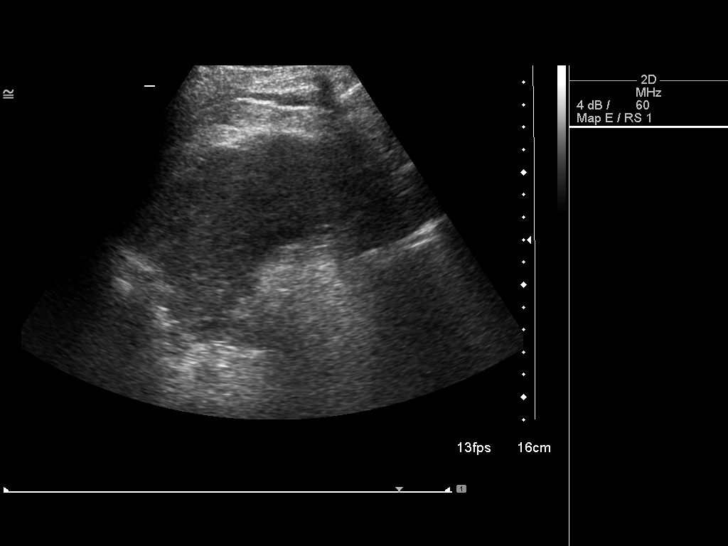
[im 88/106]
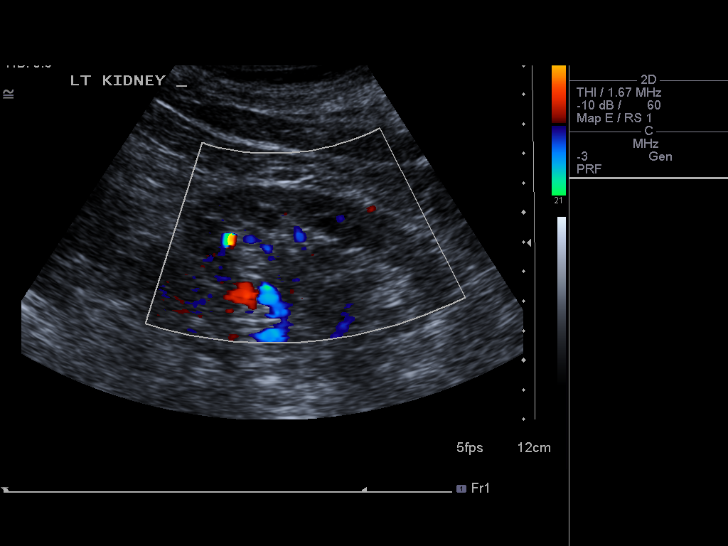
[im 97/106]
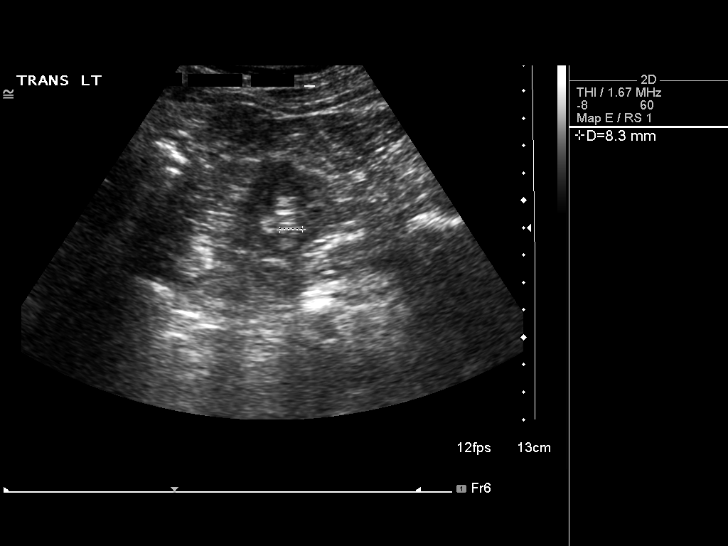
[im 106/106]
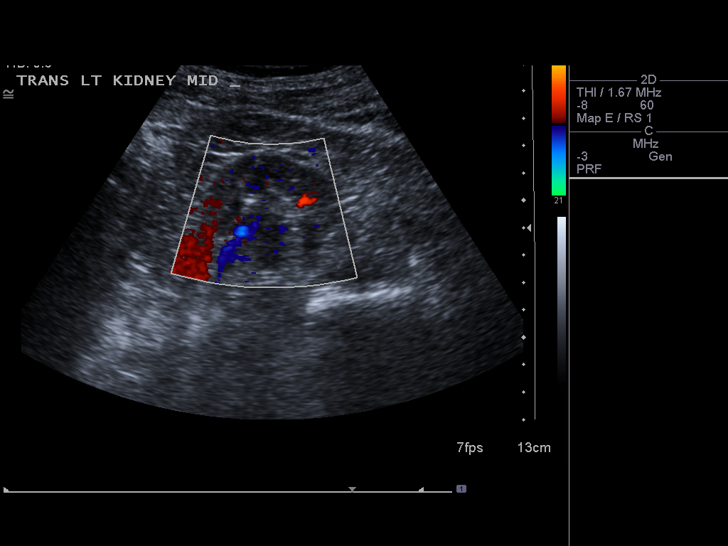

[14 of 25 positions shown; findings below may reference images not displayed]

FINDINGS: Gallbladder:  Multiple small gallstones under 4 mm.  Gallbladder
wall is not thickened.  Negative sonographic Murphy's sign

Common bile duct:  2.6 mm

Liver:  No focal lesion identified.  Within normal limits in
parenchymal echogenicity.

IVC:  Appears normal.

Pancreas:  No focal abnormality seen.

Spleen:  8.9 cm

Right Kidney:  10.0 cm in length.  Negative for obstruction.  4 x 6
mm upper pole nonobstructing stone

Left Kidney:  9.7 cm.  Negative for obstruction.  3  nonobstructing
stones are present.  6 mm upper pole stone.  8 mm lower pole stone.
5 x 8 mm mid pole stone.

Abdominal aorta:  No aneurysm identified.
IMPRESSION: Multiple small gallstones

Bilateral nonobstructing renal calculi.

## 2013-07-11 MED ORDER — PREDNISONE 10 MG PO TABS: 5.0000 mg | ORAL_TABLET | Freq: Every day | ORAL | Status: DC

## 2013-07-11 NOTE — Telephone Encounter (Signed)
i spoke with pt and is aware RX has been sent. Nothing further needed

## 2013-07-22 ENCOUNTER — Other Ambulatory Visit: Payer: Self-pay | Admitting: Family Medicine

## 2013-07-25 ENCOUNTER — Telehealth: Payer: Self-pay | Admitting: Family Medicine

## 2013-07-25 MED ORDER — TAMSULOSIN HCL 0.4 MG PO CAPS: 0.4000 mg | ORAL_CAPSULE | Freq: Every day | ORAL | Status: DC

## 2013-07-25 NOTE — Telephone Encounter (Signed)
Refill request for Flomax and I did send script e-scribe.

## 2013-07-26 ENCOUNTER — Ambulatory Visit (INDEPENDENT_AMBULATORY_CARE_PROVIDER_SITE_OTHER)
Admission: RE | Admit: 2013-07-26 | Discharge: 2013-07-26 | Disposition: A | Discharge status: Home / Self Care | Payer: Medicare Other | Source: Ambulatory Visit | Attending: Internal Medicine | Admitting: Internal Medicine

## 2013-07-26 ENCOUNTER — Other Ambulatory Visit (INDEPENDENT_AMBULATORY_CARE_PROVIDER_SITE_OTHER): Payer: Medicare Other

## 2013-07-26 ENCOUNTER — Encounter: Payer: Self-pay | Admitting: Internal Medicine

## 2013-07-26 ENCOUNTER — Ambulatory Visit (INDEPENDENT_AMBULATORY_CARE_PROVIDER_SITE_OTHER): Payer: Medicare Other | Admitting: Internal Medicine

## 2013-07-26 VITALS — BP 104/60 | HR 80 | Temp 98.1°F | Ht 66.0 in | Wt 136.0 lb

## 2013-07-26 DIAGNOSIS — M3130 Wegener's granulomatosis without renal involvement: Secondary | ICD-10-CM

## 2013-07-26 DIAGNOSIS — J9610 Chronic respiratory failure, unspecified whether with hypoxia or hypercapnia: Secondary | ICD-10-CM

## 2013-07-26 DIAGNOSIS — J479 Bronchiectasis, uncomplicated: Secondary | ICD-10-CM

## 2013-07-26 LAB — CBC WITH DIFFERENTIAL/PLATELET
Basophils Absolute: 0.1 10*3/uL (ref 0.0–0.1)
HCT: 39.7 % (ref 39.0–52.0)
Lymphs Abs: 1 10*3/uL (ref 0.7–4.0)
MCV: 92.4 fl (ref 78.0–100.0)
Monocytes Absolute: 0.7 10*3/uL (ref 0.1–1.0)
Neutrophils Relative %: 82.3 % — ABNORMAL HIGH (ref 43.0–77.0)
Platelets: 270 10*3/uL (ref 150.0–400.0)
RDW: 13.8 % (ref 11.5–14.6)
WBC: 10.9 10*3/uL — ABNORMAL HIGH (ref 4.5–10.5)

## 2013-07-26 LAB — SEDIMENTATION RATE: Sed Rate: 20 mm/hr (ref 0–22)

## 2013-07-26 LAB — URINALYSIS
Bilirubin Urine: NEGATIVE
Hgb urine dipstick: NEGATIVE
Ketones, ur: NEGATIVE
Leukocytes, UA: NEGATIVE
Nitrite: NEGATIVE
Urobilinogen, UA: 0.2 (ref 0.0–1.0)

## 2013-07-26 LAB — BASIC METABOLIC PANEL
BUN: 22 mg/dL (ref 6–23)
Chloride: 109 mEq/L (ref 96–112)
Creatinine, Ser: 1.1 mg/dL (ref 0.4–1.5)
GFR: 70.83 mL/min (ref >60.00)
Glucose, Bld: 94 mg/dL (ref 70–99)
Potassium: 4.1 mEq/L (ref 3.5–5.1)

## 2013-07-26 MED ORDER — CIPROFLOXACIN HCL 750 MG PO TABS: 750.0000 mg | ORAL_TABLET | Freq: Two times a day (BID) | ORAL | Status: DC

## 2013-07-26 NOTE — Progress Notes (Signed)
Subjective:    Patient ID: Richard [name], male    DOB: 02/17/1936, 77 y.o.   MRN: [id number]  Brief patient profile:  53  yowm never smoker with dx of Longstanding bronchiectasis then dx with WG 03/2010 > requested establish with [name].   Admit [facility] dx WG by pos c anca 03/2010, supportive tbbx but not vats   Admit [name] dx spont L Ptx 9/08/15/09 requiring Chest tube but no surgery   05/24/10--NP ov/ med reivew. Since last visit. has been hospitalized 05/12/2010-- 10/10/2011for Right pneumothorax., with underlying Wegener granulomatosis. and . Bronchiectasis. Pt was found to have Right Pneumo. Chest tube was placed. with improvement and removed 10/7. Xray on 10/10 w/ resolved pnuemo. He was continued on cytoxan and bactrim for PCP prophylaxis. rec. Prednisone 20 mg reduce to one half daily  Aspirin 81 mg one daily with breakfast but stop for any bleeding  See Patient Care Coordinator before leaving for cardiology appt > saw Dr [name], ? LHC needed     October 13, 2010 --Presents for an acute office visit. Complains of productive cough with yellow mucus, sore throat, rattling in chest, low grade temp x3days. Currently on pred 1/2 every other day . OTC not helping. rec continue qod pred, rx with augmentin x 10 days   11/16/2010 ov all smiles, no change on days of prednisone, minimal discolored sputum.   Try off prednisone completely.  If you feel a lot worse over the next  week(worse breathing/ coughing/ nausea or loss of appetite) resume prednisone 5 mg one half daily until better then return back one half every other day    12/16/2010 ov/[name] cc no better on 5mg  dosed one half every other day (not the dose rec), still nauseated, still discolored p doxy complete May 7, sob in shower but not as much walking and no longer using 02, struggling with contingency plans. rec Work on inhaler technique:  If short of breath go ahead and use the xopenex hfa 2 puffs every 4 hours  Prednisone 5 mg 4 daily until  better, then 2 daily x 5 days, then 1 daily thereafter   12/31/10 ov/[name], exac of bronchiectasis/wheeze no hemoptysis rx with pred burst and levaquin 750 x 5d  01/13/2011 ov/[name]  Cc sob and cough better now tapered prednisone to 5 mg each am using xop once daily around noon no problem with sleep or am exac of cough.   rec  02/11/2011 [name] cc recurrent hemoptysis on top of yellow that usually comes up esp in am x one year but no epistaxis, no cp or arthralgias or increased doe. rec Levaquin 750 mg x 5 days courses whenever mucus turns bloody, more dark or with high fever. Stop cytoxan and sulfasoxazole  Don't take aspirin if any active bleeding   03/16/2011 f/u ov/[name] cc no more blood,  Minimal yellow mucus, back on asa 81 without hemoptysis. No sob rec  Try prednisone 5 mg  One on even and a half on odd   Acute Visit 04/26/11 -[name]  Cc  increased chest congestion, yellow sputum beginning about 1 week ago. He took a levaquin x 5 days (9/11 - 9/15). Has a sore throat, getting better. Nasal drainage and sinus drainage worse over last 3 -4 days. He tells me that he cut grass last Thursday, may have started this. No sick contacts.  rec Please start levaquin daily for another 5 days Continue your current prednisone dosing  Start loratadine 10mg  daily until your next  visit    05/27/2011 f/u ov/[name] on 2.5 mg pred /day  cc still coughing, congested though much less purulent sputum p levaquin and no hemoptysis.  Does note fatigue > baseline  With doe worse x garbage cans to street and back and has not tried the xopenex at all. rec Try using xopenex as needed for short of breath Only take the loratidine (clariton) if needed for itching sneezing runny nose     07/19/2011 ER follow up 07/15/11 with increased cough, congestion and fever. Dx with acute bronchiectasis and possible influenza. He has started on Levaquin 750mg  x 5 days w/ last day today. Also given Tamiflu but did not fill this.  Fevers are decreasing. Cough and congesiton are improved but not gone. Still feels weak.CXR w/o acute changes.  Labs from ER reviewed with elevated WBC 11k, w/ left shift. Neg strep test. No influenza data found.  rec Extend Levaquin daily for 5 days  Mucinex DM Twice daily  As needed  Cough/congestion  Fluids and rest  Tylenol As needed  Fever   08/24/2011 f/u ov/[name] on Prednisone 5 mg one half each am  cc cough/ congestion back to baseline, no limiting sob. rec Try prednisone 5 mg one half pill alternate days to see if any worse respiratory symptoms or nausea, fatigue get worse Ok to adjust the mucinex dm to take up to 2 every 12 hours as needed For itching sneezing and runny nose, use the loratidine 10 mg (clariton)   OV 09/28/2011 ACUTE OFFICE VISIT WITH DR [name] with flare cough  rec Please take Take prednisone 40mg  once daily x 3 days, then 30mg  once daily x 3 days, then 20mg  once daily x 3 days, then prednisone 10mg  once daily  x 3 days and then 5mg  daily x 3 days and then to your baseline regimen of alternating  11/28/2011 f/u ov/[name] cc much better on higher doses of prednisone but worse w/in 5 days of floor of qod regimen with increase cough/ congestion minimally discolored sputum no epistaxis hemoptysis or aches/ pains x vague discomfort r ant chest under breast.  No hematuria rec Prednisone 20 mg per day until better, then 10 mg per day x 5 days, and then 5 mg per day.    05/31/2012 f/u ov/[name] cc no change in cough, yellowish, thick, worse in ams,  not  Bloody- no epistaxis, rash, arthralgias or sob.   rec Try prednisone 5 mg  One on even and one-half  on odd - if no change then 2 weeks before your next scheduled pulmonary visit take one half every day   10/11/2012 f/u ov/[name] still on one on even and half on odd prednisone(did not taper as per instructions, though no flare in symptoms), rare need for levaquin rec Prednisone 5 mg pill take one half daily  11/23/2012 f/u  ov/[name] re WG/ bronchiectasis Chief Complaint  Patient presents with  . Follow-up    Had PFT today-sob same,cough-yellow,wheezing occass.,  no hemoptysis, no real limiting sob though sedentary, no arthralgias or rash maintaining 2.5 mg daily No change prednisone 5 mg one half daily  > ok to try every other day if doing great May 1 flare rx levaquin and pred May 16 lap chole [place]   02/19/2013 f/u ov/[name] re WG and bronchietasis still on 5 mg one half daily  Chief Complaint  Patient presents with  . Follow-up    Pt c/o increased cough x 2 wks- prod with minimal yellow sputum. He also  has some increased SOB since increased cough started.   symptoms came on gradually with min proression and no change rx yet > has saba but not using.  Cough better with mucinex. No hemoptysis or epistaxis. Sob only with exertion rec When wheezing or coughing is worse, increase prednisone to 20 mg daily until better, then 10 mg day x 3 days and 5 mg per day x 3 days then 2.5 mg per day thereafter. mucinex dose is 600 take 2 every 12 hours as needed for cough/ congestion/ thick mucus   04/26/2013 f/u ov/[name] re: WG/ bronchiectasis p completed 10 days of levaquin 750 on 9/14  Chief Complaint  Patient presents with  . Follow-up    Breathing better. cough w/ yellow tint phlem.    Mucus never Never bloody and much lighter p levaquin, no need for saba on symb 160 2bid - wife concerned levaquin resistance developing. rec factive one daily x 5 days next time mucus gets nasty> did not tolerate Prednisone ceiling is 10 mg per day and floor is 5 mg daily  Only use your xopenex as a rescue medication   07/26/2013 f/u ov/[name] re: bronchiectasis / hj/o WG Chief Complaint  Patient presents with  . Follow-up    Breathing is unchanged since the last visit. Still has prod cough with moderate yellow sputum.  He also c/o chest tightness since he stoped levaquin approx 10 days ago.      Not using flutter or xopenex  very much at all. Maintaining floor of 5 mg daily    No obvious daytime variabilty or assoc  Sob, cp or chest tightness, subjective wheeze overt sinus or hb symptoms. No unusual exp hx     Sleeping ok without nocturnal  or early am exacerbation  of respiratory  c/o's or need for noct saba. Also denies any obvious fluctuation of symptoms with weather or environmental changes or other aggravating or alleviating factors except as outlined above   ROS  The following are not active complaints unless bolded sore throat, dysphagia, dental problems, itching, sneezing,  nasal congestion or excess/ purulent secretions, ear ache,   fever, chills, sweats, unintended wt loss, pleuritic or exertional cp, hemoptysis,  orthopnea pnd or leg swelling, presyncope, palpitations, heartburn, abdominal pain, anorexia, nausea, vomiting, diarrhea  or change in bowel or urinary habits, change in stools or urine, dysuria,hematuria,  rash, arthralgias, visual complaints, headache, numbness weakness or ataxia or problems with walking or coordination,  change in mood/affect or memory.           Objective:   Physical Exam wt   128  04/26/11 >07/08/2011  134> 134 11/28/2011 > 02/29/2012 136> 05/31/2012  136>  134 11/22/12 > 07/26/2013  136  amb thin pleasant wm nad with rattling congested sounding cough SKIN: no rash, lesions  NODES: no lymphadenopathy  HEENT: Marks/AT, EOM- WNL, Conjuctivae- clear, PERRLA, TM-WNL, Nose- clear, Throat- clear and wnl, Mallampati III  NECK: Supple w/ fair ROM, JVD- none, normal carotid impulses w/o bruits Thyroid-  CHEST: min bilateral exp rhonchi  No resp distress HEART: RRR, no m/g/r heard  ABDOMEN: Soft and nl EXT:  No deformities or restrucitons            CXR  07/26/2013 :  1. Possible new onset of bilateral pulmonary nodules. Chest CT suggest for further evaluation. 2. Stable chronic pulmonary interstitial lung disease. 3. Stable cardiomegaly. 4. Stable large sliding hiatal  hernia.        Assessment & Plan:

## 2013-07-26 NOTE — Patient Instructions (Signed)
Cipro 750 twice daily x 10 days if you get nasty mucus  Use the flutter valve and chest percussion   Please remember to go to the lab and x-ray department downstairs for your tests - we will call you with the results when they are available.     Please schedule a follow up visit in 3 months but call sooner if needed

## 2013-07-27 NOTE — Assessment & Plan Note (Signed)
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 > CTX started    - Off all prednisone with baseline esr 31 11/16/2010  > restarted 12/16/2010 with nausea and wt down to 115 but ok sats walking x 3 laps   - Recheck ANCA sent  12/16/2010  >  neg   -Trial off cytoxan starting 02/12/2011 >   03/16/11 stopped - Prednisone ceiling is 10 mg per day and floor is 5 mg daily as of 04/26/13  ? Recurrent WG >  No evidence of renal involvement and ESR  15   so bring backin 4 weeks for anca levels / repeat cxr then ? Ct sinus and chest.

## 2013-07-27 NOTE — Assessment & Plan Note (Signed)
-   HFA 90% p coaching 12/16/2010      - PFT's 06/21/11   FEV1  1.60 (61%) ratio 48 and 10% better p B2,  DLCO 83%     - PFT's 11/22/2012 FEV1  1.52 (62%) arnd 46 and no change p B2 DLCO 89%       DDX of  difficult airways managment all start with A and  include Adherence, Ace Inhibitors, Acid Reflux, Active Sinus Disease, Alpha 1 Antitripsin deficiency, Anxiety masquerading as Airways dz,  ABPA,  allergy(esp in young), Aspiration (esp in elderly), Adverse effects of DPI,  Active smokers, plus two Bs  = Bronchiectasis and Beta blocker use..and one C= CHF  Adherence is always the initial "prime suspect" and is a multilayered concern that requires a "trust but verify" approach in every patient - starting with knowing how to use medications, especially inhalers, correctly, keeping up with refills and understanding the fundamental difference between maintenance and prns vs those medications only taken for a very short course and then stopped and not refilled.   Reminded re use of flutter and xopenex for flares  Will change to cipro 750 x10 days for next flare ? Could he be developing pseudomonas colonization

## 2013-07-27 NOTE — Assessment & Plan Note (Addendum)
ONO RA  12/01/12  > ok  Pt no longer using 02 > ok to d/c 02

## 2013-08-02 IMAGING — RF DG CHOLANGIOGRAM OPERATIVE
1 series · 12 of 12 positions shown · non-contrast
Comparison: Ultrasound 11/29/2012

CLINICAL DATA: Cholelithiasis.

INTRAOPERATIVE CHOLANGIOGRAM
TECHNIQUE: C-arm fluoroscopic images were obtained
intraoperatively and submitted for postoperative interpretation.
Please see the performing provider's procedural report for the
fluoroscopy time utilized.

[Series 1: run · 3 acquisitions, 12 frames shown]
[im 1/3]
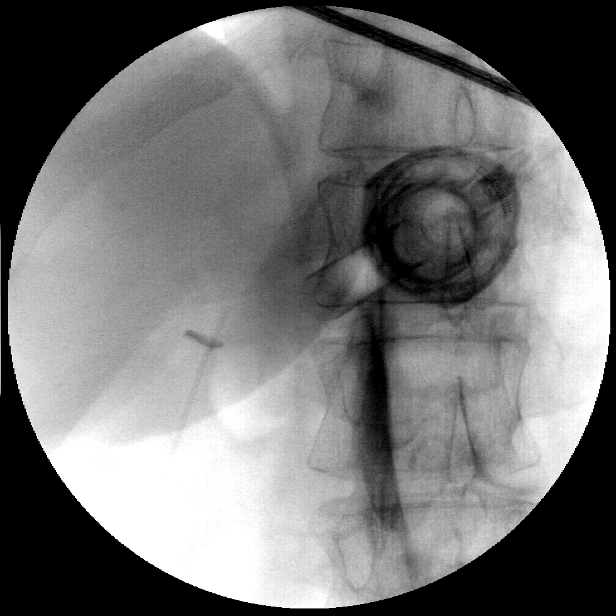
[im 1/3]
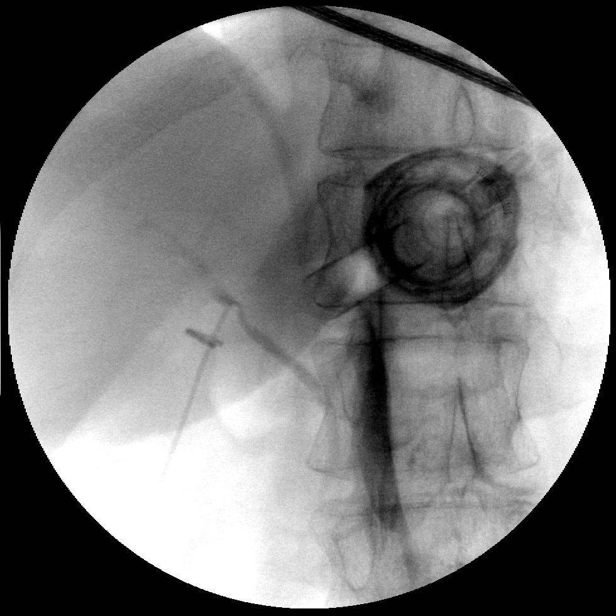
[im 1/3]
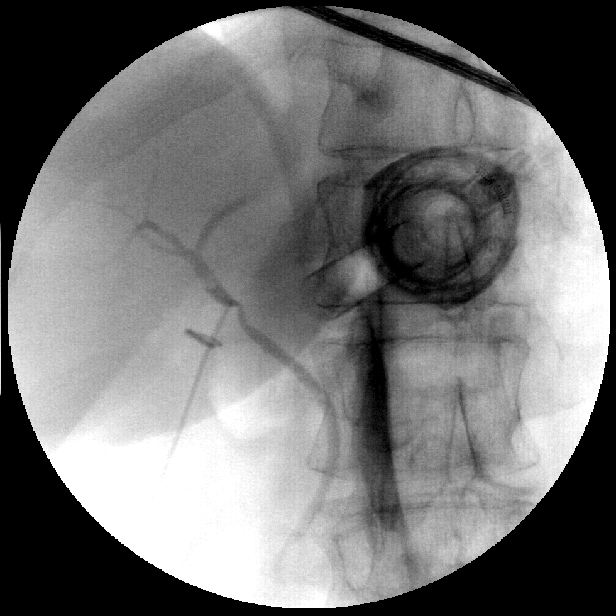
[im 1/3]
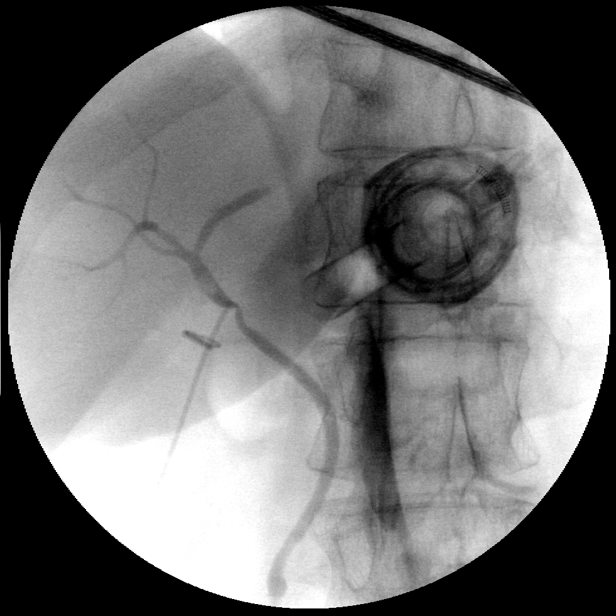
[im 2/3]
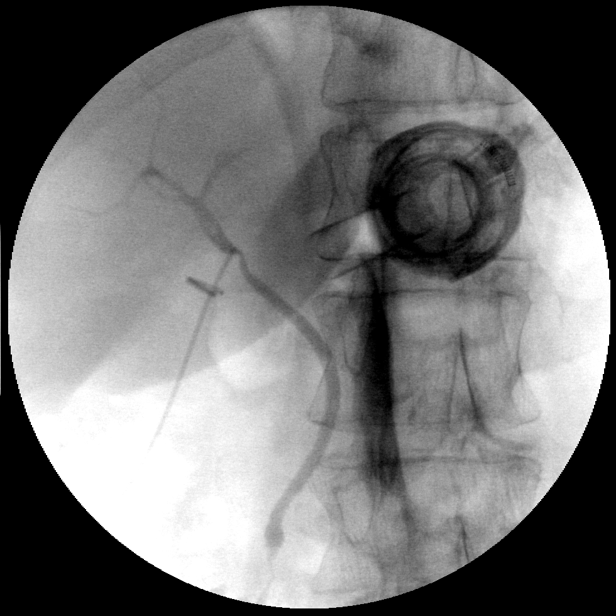
[im 2/3]
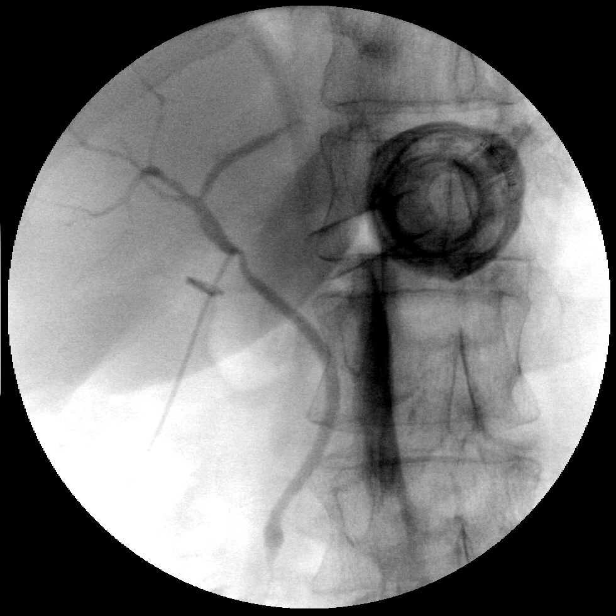
[im 2/3]
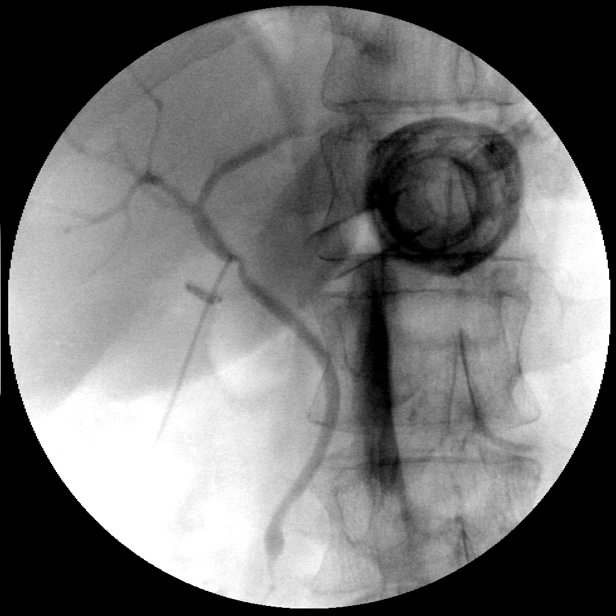
[im 2/3]
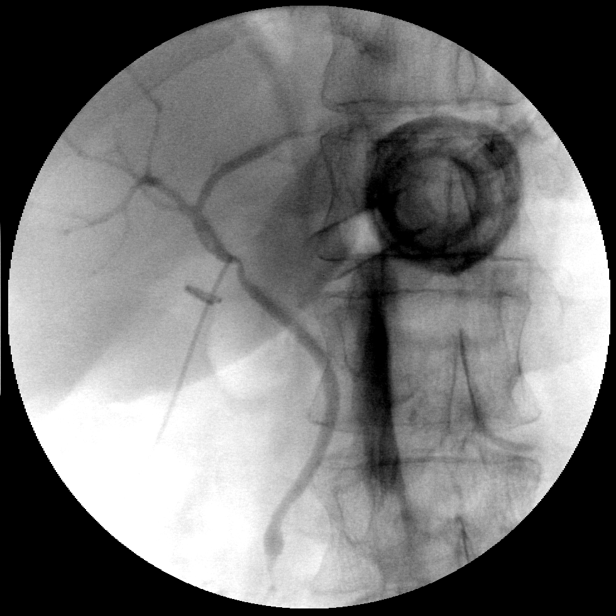
[im 3/3]
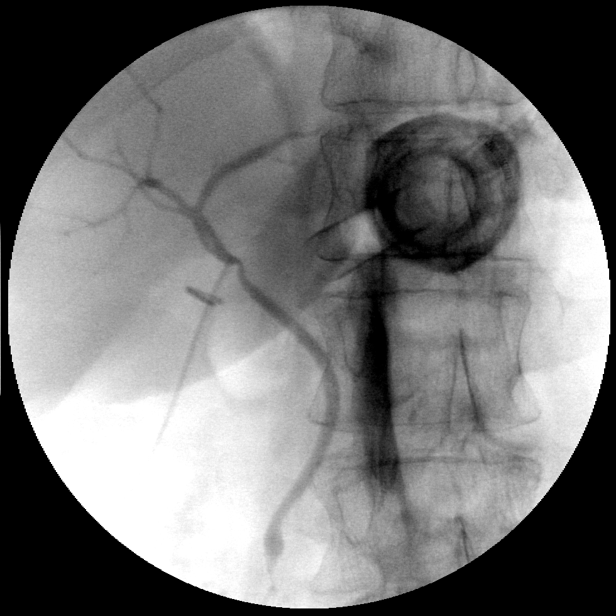
[im 3/3]
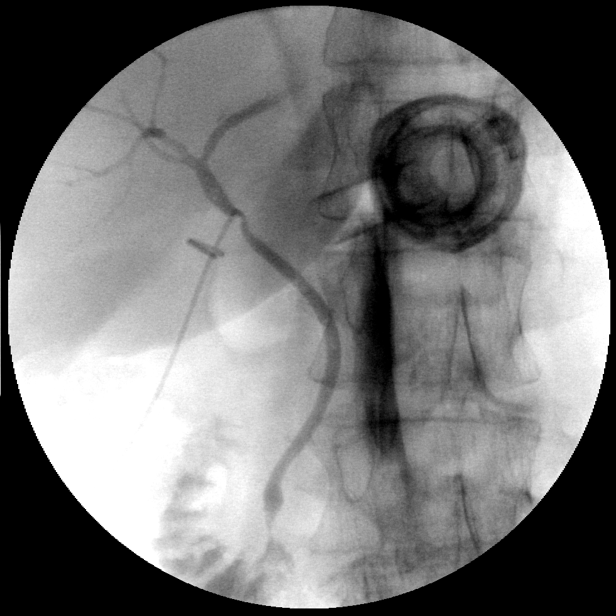
[im 3/3]
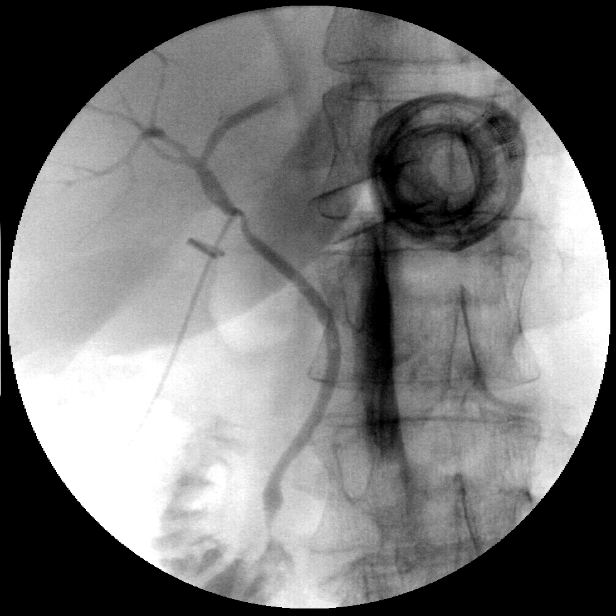
[im 3/3]
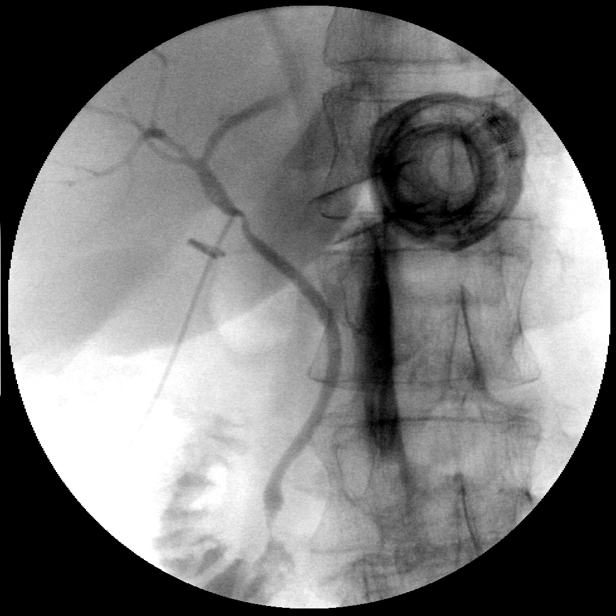

[12 of 12 positions shown; findings below may reference images not displayed]

FINDINGS: Opacification of the intrahepatic and extrahepatic
biliary system. The biliary system is nondilated.  There are small
filling defects in the common hepatic duct and a small filling
defect in the distal common bile duct.  These filling defects are
nonobstructive.  The common bile duct is patent with contrast in
the duodenum.
IMPRESSION: Small nonobstructive filling defects in the common hepatic duct and
common bile duct.  Findings could be related to gas bubbles or
nonobstructive stones.

## 2013-08-06 ENCOUNTER — Other Ambulatory Visit: Payer: Self-pay | Admitting: Internal Medicine

## 2013-08-19 ENCOUNTER — Ambulatory Visit (INDEPENDENT_AMBULATORY_CARE_PROVIDER_SITE_OTHER): Payer: Medicare Other | Admitting: Internal Medicine

## 2013-08-19 ENCOUNTER — Ambulatory Visit (INDEPENDENT_AMBULATORY_CARE_PROVIDER_SITE_OTHER)
Admission: RE | Admit: 2013-08-19 | Discharge: 2013-08-19 | Disposition: A | Discharge status: Home / Self Care | Payer: Medicare Other | Source: Ambulatory Visit | Attending: Internal Medicine | Admitting: Internal Medicine

## 2013-08-19 ENCOUNTER — Other Ambulatory Visit: Payer: Medicare Other

## 2013-08-19 ENCOUNTER — Other Ambulatory Visit: Payer: Self-pay | Admitting: Internal Medicine

## 2013-08-19 ENCOUNTER — Encounter: Payer: Self-pay | Admitting: Internal Medicine

## 2013-08-19 VITALS — BP 116/64 | HR 73 | Temp 98.1°F | Ht 66.0 in | Wt 136.2 lb

## 2013-08-19 DIAGNOSIS — M3130 Wegener's granulomatosis without renal involvement: Secondary | ICD-10-CM

## 2013-08-19 DIAGNOSIS — J479 Bronchiectasis, uncomplicated: Secondary | ICD-10-CM

## 2013-08-19 NOTE — Progress Notes (Signed)
Subjective:    Patient ID: Richard [name], male    DOB: Feb 12, 1936     MRN: [id number]  Brief patient profile:  81  yowm never smoker with dx of Longstanding bronchiectasis then dx with WG 03/2010 > requested establish with [name].   Admit [name] dx WG by pos c anca 03/2010, supportive tbbx but not vats   Admit [name] dx spont L Ptx 9/08/15/09 requiring Chest tube but no surgery   05/24/10--NP ov/ med reivew. Since last visit. has been hospitalized 05/12/2010-- 10/10/2011for Right pneumothorax., with underlying Wegener granulomatosis. and . Bronchiectasis. Pt was found to have Right Pneumo. Chest tube was placed. with improvement and removed 10/7. Xray on 10/10 w/ resolved pnuemo. He was continued on cytoxan and bactrim for PCP prophylaxis. rec. Prednisone 20 mg reduce to one half daily  Aspirin 81 mg one daily with breakfast but stop for any bleeding  See Patient Care Coordinator before leaving for cardiology appt > saw [name], ? LHC needed     October 13, 2010 --Presents for an acute office visit. Complains of productive cough with yellow mucus, sore throat, rattling in chest, low grade temp x3days. Currently on pred 1/2 every other day . OTC not helping. rec continue qod pred, rx with augmentin x 10 days   11/16/2010 ov all smiles, no change on days of prednisone, minimal discolored sputum.   Try off prednisone completely.  If you feel a lot worse over the next  week(worse breathing/ coughing/ nausea or loss of appetite) resume prednisone 5 mg one half daily until better then return back one half every other day    12/16/2010 ov/Margarit Minshall cc no better on 5mg  dosed one half every other day (not the dose rec), still nauseated, still discolored p doxy complete May 7, sob in shower but not as much walking and no longer using 02, struggling with contingency plans. rec Work on inhaler technique:  If short of breath go ahead and use the xopenex hfa 2 puffs every 4 hours  Prednisone 5 mg 4 daily until better, then  2 daily x 5 days, then 1 daily thereafter   12/31/10 ov/[name], exac of bronchiectasis/wheeze no hemoptysis rx with pred burst and levaquin 750 x 5d  01/13/2011 ov/Parks Czajkowski  Cc sob and cough better now tapered prednisone to 5 mg each am using xop once daily around noon no problem with sleep or am exac of cough.   rec  02/11/2011 [name] cc recurrent hemoptysis on top of yellow that usually comes up esp in am x one year but no epistaxis, no cp or arthralgias or increased doe. rec Levaquin 750 mg x 5 days courses whenever mucus turns bloody, more dark or with high fever. Stop cytoxan and sulfasoxazole  Don't take aspirin if any active bleeding   03/16/2011 f/u ov/Arlee Bossard cc no more blood,  Minimal yellow mucus, back on asa 81 without hemoptysis. No sob rec  Try prednisone 5 mg  One on even and a half on odd   Acute Visit 04/26/11 -[name]  Cc  increased chest congestion, yellow sputum beginning about 1 week ago. He took a levaquin x 5 days (9/11 - 9/15). Has a sore throat, getting better. Nasal drainage and sinus drainage worse over last 3 -4 days. He tells me that he cut grass last Thursday, may have started this. No sick contacts.  rec Please start levaquin daily for another 5 days Continue your current prednisone dosing  Start loratadine 10mg  daily until your next  visit    05/27/2011 f/u ov/Adalia Pettis on 2.5 mg pred /day  cc still coughing, congested though much less purulent sputum p levaquin and no hemoptysis.  Does note fatigue > baseline  With doe worse x garbage cans to street and back and has not tried the xopenex at all. rec Try using xopenex as needed for short of breath Only take the loratidine (clariton) if needed for itching sneezing runny nose     07/19/2011 ER follow up 07/15/11 with increased cough, congestion and fever. Dx with acute bronchiectasis and possible influenza. He has started on Levaquin 750mg  x 5 days w/ last day today. Also given Tamiflu but did not fill this. Fevers are  decreasing. Cough and congesiton are improved but not gone. Still feels weak.CXR w/o acute changes.  Labs from ER reviewed with elevated WBC 11k, w/ left shift. Neg strep test. No influenza data found.  rec Extend Levaquin daily for 5 days  Mucinex DM Twice daily  As needed  Cough/congestion  Fluids and rest  Tylenol As needed  Fever   08/24/2011 f/u ov/[name] on Prednisone 5 mg one half each am  cc cough/ congestion back to baseline, no limiting sob. rec Try prednisone 5 mg one half pill alternate days to see if any worse respiratory symptoms or nausea, fatigue get worse Ok to adjust the mucinex dm to take up to 2 every 12 hours as needed For itching sneezing and runny nose, use the loratidine 10 mg (clariton)   OV 09/28/2011 ACUTE OFFICE VISIT WITH DR [name] with flare cough  rec Please take Take prednisone 40mg  once daily x 3 days, then 30mg  once daily x 3 days, then 20mg  once daily x 3 days, then prednisone 10mg  once daily  x 3 days and then 5mg  daily x 3 days and then to your baseline regimen of alternating  11/28/2011 f/u ov/Tumeka Chimenti cc much better on higher doses of prednisone but worse w/in 5 days of floor of qod regimen with increase cough/ congestion minimally discolored sputum no epistaxis hemoptysis or aches/ pains x vague discomfort r ant chest under breast.  No hematuria rec Prednisone 20 mg per day until better, then 10 mg per day x 5 days, and then 5 mg per day.    05/31/2012 f/u ov/Algie Westry cc no change in cough, yellowish, thick, worse in ams,  not  Bloody- no epistaxis, rash, arthralgias or sob.   rec Try prednisone 5 mg  One on even and one-half  on odd - if no change then 2 weeks before your next scheduled pulmonary visit take one half every day   10/11/2012 f/u ov/Frederick Marro still on one on even and half on odd prednisone(did not taper as per instructions, though no flare in symptoms), rare need for levaquin rec Prednisone 5 mg pill take one half daily  11/23/2012 f/u ov/Merial Moritz re  WG/ bronchiectasis Chief Complaint  Patient presents with  . Follow-up    Had PFT today-sob same,cough-yellow,wheezing occass.,  no hemoptysis, no real limiting sob though sedentary, no arthralgias or rash maintaining 2.5 mg daily No change prednisone 5 mg one half daily  > ok to try every other day if doing great May 1 flare rx levaquin and pred May 16 lap chole [facility]   02/19/2013 f/u ov/Nerea Bordenave re WG and bronchietasis still on 5 mg one half daily  Chief Complaint  Patient presents with  . Follow-up    Pt c/o increased cough x 2 wks- prod with minimal yellow sputum. He also  has some increased SOB since increased cough started.   symptoms came on gradually with min proression and no change rx yet > has saba but not using.  Cough better with mucinex. No hemoptysis or epistaxis. Sob only with exertion rec When wheezing or coughing is worse, increase prednisone to 20 mg daily until better, then 10 mg day x 3 days and 5 mg per day x 3 days then 2.5 mg per day thereafter. mucinex dose is 600 take 2 every 12 hours as needed for cough/ congestion/ thick mucus   04/26/2013 f/u ov/Rylah Fukuda re: WG/ bronchiectasis p completed 10 days of levaquin 750 on 9/14  Chief Complaint  Patient presents with  . Follow-up    Breathing better. cough w/ yellow tint phlem.    Mucus never Never bloody and much lighter p levaquin, no need for saba on symb 160 2bid - wife concerned levaquin resistance developing. rec factive one daily x 5 days next time mucus gets nasty> did not tolerate Prednisone ceiling is 10 mg per day and floor is 5 mg daily  Only use your xopenex as a rescue medication   07/26/2013 f/u ov/Hong Timm re: bronchiectasis / hj/o WG Chief Complaint  Patient presents with  . Follow-up    Breathing is unchanged since the last visit. Still has prod cough with moderate yellow sputum.  He also c/o chest tightness since he stoped levaquin approx 10 days ago.   Not using flutter or xopenex very much at all.  Maintaining floor of 5 mg daily  rec Cipro 750 twice daily x 10 days if you get nasty mucus Use the flutter valve and chest percussion    08/19/2013 f/u ov/Fabienne Nolasco re: ? Recurrent WG Chief Complaint  Patient presents with  . Follow-up    Breathing is unchanged. Cough unchanged-prod cpugh w/ yellow phlem. On 2nd dose of cipro  now on day 5/10 cipro and less dark, never bloody, no sinus complaints, arthritis rash or fever or hematuria.  No obvious daytime variabilty or assoc  Sob, cp or chest tightness, subjective wheeze overt sinus or hb symptoms. No unusual exp hx     Sleeping ok without nocturnal  or early am exacerbation  of respiratory  c/o's or need for noct saba. Also denies any obvious fluctuation of symptoms with weather or environmental changes or other aggravating or alleviating factors except as outlined above   ROS  The following are not active complaints unless bolded sore throat, dysphagia, dental problems, itching, sneezing,  nasal congestion or excess/ purulent secretions, ear ache,   fever, chills, sweats, unintended wt loss, pleuritic or exertional cp, hemoptysis,  orthopnea pnd or leg swelling, presyncope, palpitations, heartburn, abdominal pain, anorexia, nausea, vomiting, diarrhea  or change in bowel or urinary habits, change in stools or urine, dysuria,hematuria,  rash, arthralgias, visual complaints, headache, numbness weakness or ataxia or problems with walking or coordination,  change in mood/affect or memory.           Objective:   Physical Exam  wt   128  04/26/11 >07/08/2011  134> 134 11/28/2011 > 02/29/2012 136> 05/31/2012  136>  134 11/22/12 > 07/26/2013  136 > 08/21/2013  136  amb thin pleasant wm nad with rattling congested sounding cough SKIN: no rash, lesions  NODES: no lymphadenopathy  HEENT: /AT, EOM- WNL, Conjuctivae- clear, PERRLA, TM-WNL, Nose- clear, Throat- clear and wnl, Mallampati III  NECK: Supple w/ fair ROM, JVD- none, normal carotid impulses w/o  bruits Thyroid-  CHEST: min bilateral exp  rhonchi  No resp distress HEART: RRR, no m/g/r heard  ABDOMEN: Soft and nl EXT:  No deformities or restrucitons            CXR  08/19/2013 :  1. There are stable findings consistent with COPD or reactive airway  disease with bibasilar bronchiectasis.  2. There are parenchymal densities that are more conspicuous today  in the inferior aspect of the right upper lobe. There is also  subpleural nodularity in the lateral aspect of the left mid lung  which is relatively stable.  3. There is no evidence of CHF nor of a pleural or pericardial  effusion.  4. There is a large hiatal hernia -partially intrathoracic stomach.        Assessment & Plan:

## 2013-08-19 NOTE — Patient Instructions (Addendum)
Please remember to go to the lab   department downstairs for your tests - we will call you with the results when they are available.    

## 2013-08-21 LAB — ANCA TITERS: C-ANCA: 1:80 {titer} — ABNORMAL HIGH

## 2013-08-21 LAB — ANCA SCREEN W REFLEX TITER
ATYPICAL P-ANCA SCREEN: NEGATIVE
P-ANCA SCREEN: NEGATIVE
c-ANCA Screen: POSITIVE — AB

## 2013-08-21 NOTE — Assessment & Plan Note (Signed)
-   HFA 90% p coaching 12/16/2010      - PFT's 06/21/11   FEV1  1.60 (61%) ratio 48 and 10% better p B2,  DLCO 83%     - PFT's 11/22/2012 FEV1  1.52 (62%) arnd 46 and no change p B2 DLCO 89%   Adequate control on present rx, reviewed > no change in rx needed      Each maintenance medication was reviewed in detail including most importantly the difference between maintenance and as needed and under what circumstances the prns are to be used. This was done in the context of a medication calendar review which provided the patient with a user-friendly unambiguous mechanism for medication administration and reconciliation and provides an action plan for all active problems. It is critical that this be shown to every doctor  for modification during the office visit if necessary so the patient can use it as a working document.

## 2013-08-21 NOTE — Progress Notes (Signed)
Quick Note:  LMTCB ______ 

## 2013-08-21 NOTE — Assessment & Plan Note (Signed)
-  Dx by TBBX/ pos anca 03/2010 > CTX started    - Off all prednisone with baseline esr 31 11/16/2010  > restarted 12/16/2010 with nausea and wt down to 115 but ok sats walking x 3 laps   - Recheck ANCA sent  12/16/2010  >  neg   -Trial off cytoxan starting 02/12/2011 >   03/16/11 stopped  - Prednisone ceiling is 10 mg per day and floor is 5 mg daily as of 04/26/13  - 08/19/13 ANCA POS 1:80 with increasing nodular dz >  Discussed in detail all the  indications, usual  risks and alternatives  relative to the benefits with patient who agrees to proceed with  .  rx 08/21/2013 cytoxan 50 mg daily with f/u in 3 weeks  This may be subtherapeutic but did not tolerate all that well last time with macrocytic anemia and may consider alternatives by next ov depending on response and side effects

## 2013-08-22 ENCOUNTER — Telehealth: Payer: Self-pay | Admitting: Internal Medicine

## 2013-08-22 MED ORDER — CYCLOPHOSPHAMIDE 50 MG PO TABS: 50.0000 mg | ORAL_TABLET | Freq: Every day | ORAL | Status: DC

## 2013-08-22 NOTE — Telephone Encounter (Signed)
New rx for Cytoxan has been sent in with dx code.

## 2013-08-22 NOTE — Telephone Encounter (Signed)
Per pt's chart, Richard [name] had called about ANCA Titer results: Notes Recorded by Rosana Berger, CMA on 08/21/2013 at 4:06 PM LMTCB Notes Recorded by Tanda Rockers, MD on 08/21/2013 at 3:56 PM Call patient : Studies are c/w the wegeners starting up again and best bet is to start cytoxan again but in lower than usual doses = 50 mg daily, return in 3 week with cxr and cbc on return  Called spoke with patient and advised of lab results / recs as stated by MW above.  Pt okay with these recommendations and verbalized his understanding.  Pt requested Cytoxan rx be sent to [facility] .  3 week follow up scheduled w/ MW for 2.5.15 @ 1100 w/ note for cxr and cbcd.  Pt denied any questions as this time, but advised pt to call should he have any questions at a later date.  Rx sent Will sign off

## 2013-08-22 NOTE — Progress Notes (Signed)
Quick Note:  Pt aware per 1.15.15 phone note. ______

## 2013-08-23 ENCOUNTER — Telehealth: Payer: Self-pay | Admitting: Internal Medicine

## 2013-08-23 NOTE — Telephone Encounter (Signed)
Called and spoke with the pt and he stated that the pharmacy is not able to get the cytoxan in the tablet form but can order the capsules.  Pt is wanting [name] to follow up on this.   Called and spoke with the pharmacy and they stated the same as above.  i gave the ok to change to capsule for for the same dose and directions and they will order this for the pt.

## 2013-08-23 NOTE — Telephone Encounter (Signed)
RX was sent 1/151/5 to [facility] for cytoxan. Called [facility] spoke with pharmacists. They just needed the okay to dispense this medication and it will be generic for pt. I gave the okay. Nothing further needed

## 2013-09-06 ENCOUNTER — Other Ambulatory Visit (INDEPENDENT_AMBULATORY_CARE_PROVIDER_SITE_OTHER): Payer: Medicare Other

## 2013-09-06 ENCOUNTER — Ambulatory Visit (INDEPENDENT_AMBULATORY_CARE_PROVIDER_SITE_OTHER): Payer: Medicare Other | Admitting: Adult Health

## 2013-09-06 ENCOUNTER — Telehealth: Payer: Self-pay | Admitting: Internal Medicine

## 2013-09-06 ENCOUNTER — Encounter: Payer: Self-pay | Admitting: Adult Health

## 2013-09-06 ENCOUNTER — Ambulatory Visit (INDEPENDENT_AMBULATORY_CARE_PROVIDER_SITE_OTHER)
Admission: RE | Admit: 2013-09-06 | Discharge: 2013-09-06 | Disposition: A | Discharge status: Home / Self Care | Payer: Medicare Other | Source: Ambulatory Visit | Attending: Adult Health | Admitting: Adult Health

## 2013-09-06 VITALS — BP 124/66 | HR 82 | Temp 98.3°F | Ht 66.0 in | Wt 133.8 lb

## 2013-09-06 DIAGNOSIS — J471 Bronchiectasis with (acute) exacerbation: Secondary | ICD-10-CM

## 2013-09-06 DIAGNOSIS — J479 Bronchiectasis, uncomplicated: Secondary | ICD-10-CM

## 2013-09-06 DIAGNOSIS — M3130 Wegener's granulomatosis without renal involvement: Secondary | ICD-10-CM

## 2013-09-06 LAB — CBC WITH DIFFERENTIAL/PLATELET
Basophils Absolute: 0 10*3/uL (ref 0.0–0.1)
Basophils Relative: 0.1 % (ref 0.0–3.0)
Eosinophils Absolute: 0 10*3/uL (ref 0.0–0.7)
Eosinophils Relative: 0.2 % (ref 0.0–5.0)
HEMATOCRIT: 41.3 % (ref 39.0–52.0)
Hemoglobin: 13.3 g/dL (ref 13.0–17.0)
Lymphocytes Relative: 7.4 % — ABNORMAL LOW (ref 12.0–46.0)
Lymphs Abs: 0.7 10*3/uL (ref 0.7–4.0)
MCHC: 32.2 g/dL (ref 30.0–36.0)
MCV: 94.1 fl (ref 78.0–100.0)
MONOS PCT: 4.9 % (ref 3.0–12.0)
Monocytes Absolute: 0.5 10*3/uL (ref 0.1–1.0)
Neutro Abs: 8.6 10*3/uL — ABNORMAL HIGH (ref 1.4–7.7)
Neutrophils Relative %: 87.4 % — ABNORMAL HIGH (ref 43.0–77.0)
PLATELETS: 379 10*3/uL (ref 150.0–400.0)
RBC: 4.39 Mil/uL (ref 4.22–5.81)
RDW: 13.4 % (ref 11.5–14.6)
WBC: 9.9 10*3/uL (ref 4.5–10.5)

## 2013-09-06 NOTE — Patient Instructions (Signed)
Finish Cipro 750 twice daily x 10 days as directed.  Use the flutter valve and chest percussion  Fluids and rest .  Tylenol As needed   Labs and xray today .  follow up Dr. [name]  Next week as planned and As needed

## 2013-09-06 NOTE — Assessment & Plan Note (Signed)
Flare  Check xray today and cbc   Plan  Finish Cipro 750 twice daily x 10 days as directed.  Use the flutter valve and chest percussion  Fluids and rest .  Tylenol As needed   Labs and xray today .  follow up Dr. [name]  Next week as planned and As needed

## 2013-09-06 NOTE — Assessment & Plan Note (Signed)
Flare recently started on cytoxan   Plan  Labs and xray today .  follow up Dr. [name]  Next week as planned and As needed

## 2013-09-06 NOTE — Progress Notes (Signed)
Subjective:    Patient ID: Richard [name], male    DOB: Feb 12, 1936     MRN: [id number]  Brief patient profile:  81  yowm never smoker with dx of Longstanding bronchiectasis then dx with WG 03/2010 > requested establish with [name].   Admit [name] dx WG by pos c anca 03/2010, supportive tbbx but not vats   Admit [name] dx spont L Ptx 9/08/15/09 requiring Chest tube but no surgery   05/24/10--NP ov/ med reivew. Since last visit. has been hospitalized 05/12/2010-- 10/10/2011for Right pneumothorax., with underlying Wegener granulomatosis. and . Bronchiectasis. Pt was found to have Right Pneumo. Chest tube was placed. with improvement and removed 10/7. Xray on 10/10 w/ resolved pnuemo. He was continued on cytoxan and bactrim for PCP prophylaxis. rec. Prednisone 20 mg reduce to one half daily  Aspirin 81 mg one daily with breakfast but stop for any bleeding  See Patient Care Coordinator before leaving for cardiology appt > saw [name], ? LHC needed     October 13, 2010 --Presents for an acute office visit. Complains of productive cough with yellow mucus, sore throat, rattling in chest, low grade temp x3days. Currently on pred 1/2 every other day . OTC not helping. rec continue qod pred, rx with augmentin x 10 days   11/16/2010 ov all smiles, no change on days of prednisone, minimal discolored sputum.   Try off prednisone completely.  If you feel a lot worse over the next  week(worse breathing/ coughing/ nausea or loss of appetite) resume prednisone 5 mg one half daily until better then return back one half every other day    12/16/2010 ov/[name] cc no better on 5mg  dosed one half every other day (not the dose rec), still nauseated, still discolored p doxy complete May 7, sob in shower but not as much walking and no longer using 02, struggling with contingency plans. rec Work on inhaler technique:  If short of breath go ahead and use the xopenex hfa 2 puffs every 4 hours  Prednisone 5 mg 4 daily until better, then  2 daily x 5 days, then 1 daily thereafter   12/31/10 ov/[name], exac of bronchiectasis/wheeze no hemoptysis rx with pred burst and levaquin 750 x 5d  01/13/2011 ov/[name]  Cc sob and cough better now tapered prednisone to 5 mg each am using xop once daily around noon no problem with sleep or am exac of cough.   rec  02/11/2011 [name] cc recurrent hemoptysis on top of yellow that usually comes up esp in am x one year but no epistaxis, no cp or arthralgias or increased doe. rec Levaquin 750 mg x 5 days courses whenever mucus turns bloody, more dark or with high fever. Stop cytoxan and sulfasoxazole  Don't take aspirin if any active bleeding   03/16/2011 f/u ov/[name] cc no more blood,  Minimal yellow mucus, back on asa 81 without hemoptysis. No sob rec  Try prednisone 5 mg  One on even and a half on odd   Acute Visit 04/26/11 -Richard [name]  Cc  increased chest congestion, yellow sputum beginning about 1 week ago. He took a levaquin x 5 days (9/11 - 9/15). Has a sore throat, getting better. Nasal drainage and sinus drainage worse over last 3 -4 days. He tells me that he cut grass last Thursday, may have started this. No sick contacts.  rec Please start levaquin daily for another 5 days Continue your current prednisone dosing  Start loratadine 10mg  daily until your next  visit    05/27/2011 f/u ov/[name] on 2.5 mg pred /day  cc still coughing, congested though much less purulent sputum p levaquin and no hemoptysis.  Does note fatigue > baseline  With doe worse x garbage cans to street and back and has not tried the xopenex at all. rec Try using xopenex as needed for short of breath Only take the loratidine (clariton) if needed for itching sneezing runny nose     07/19/2011 ER follow up 07/15/11 with increased cough, congestion and fever. Dx with acute bronchiectasis and possible influenza. He has started on Levaquin 750mg  x 5 days w/ last day today. Also given Tamiflu but did not fill this. Fevers are  decreasing. Cough and congesiton are improved but not gone. Still feels weak.CXR w/o acute changes.  Labs from ER reviewed with elevated WBC 11k, w/ left shift. Neg strep test. No influenza data found.  rec Extend Levaquin daily for 5 days  Mucinex DM Twice daily  As needed  Cough/congestion  Fluids and rest  Tylenol As needed  Fever   08/24/2011 f/u ov/[name] on Prednisone 5 mg one half each am  cc cough/ congestion back to baseline, no limiting sob. rec Try prednisone 5 mg one half pill alternate days to see if any worse respiratory symptoms or nausea, fatigue get worse Ok to adjust the mucinex dm to take up to 2 every 12 hours as needed For itching sneezing and runny nose, use the loratidine 10 mg (clariton)   OV 09/28/2011 ACUTE OFFICE VISIT WITH DR [name] with flare cough  rec Please take Take prednisone 40mg  once daily x 3 days, then 30mg  once daily x 3 days, then 20mg  once daily x 3 days, then prednisone 10mg  once daily  x 3 days and then 5mg  daily x 3 days and then to your baseline regimen of alternating  11/28/2011 f/u ov/[name] cc much better on higher doses of prednisone but worse w/in 5 days of floor of qod regimen with increase cough/ congestion minimally discolored sputum no epistaxis hemoptysis or aches/ pains x vague discomfort r ant chest under breast.  No hematuria rec Prednisone 20 mg per day until better, then 10 mg per day x 5 days, and then 5 mg per day.    05/31/2012 f/u ov/[name] cc no change in cough, yellowish, thick, worse in ams,  not  Bloody- no epistaxis, rash, arthralgias or sob.   rec Try prednisone 5 mg  One on even and one-half  on odd - if no change then 2 weeks before your next scheduled pulmonary visit take one half every day   10/11/2012 f/u ov/[name] still on one on even and half on odd prednisone(did not taper as per instructions, though no flare in symptoms), rare need for levaquin rec Prednisone 5 mg pill take one half daily  11/23/2012 f/u ov/[name] re  WG/ bronchiectasis Chief Complaint  Patient presents with  . Follow-up    Had PFT today-sob same,cough-yellow,wheezing occass.,  no hemoptysis, no real limiting sob though sedentary, no arthralgias or rash maintaining 2.5 mg daily No change prednisone 5 mg one half daily  > ok to try every other day if doing great May 1 flare rx levaquin and pred May 16 lap chole hoxworth   02/19/2013 f/u ov/[name] re WG and bronchietasis still on 5 mg one half daily  Chief Complaint  Patient presents with  . Follow-up    Pt c/o increased cough x 2 wks- prod with minimal yellow sputum. He also  has some increased SOB since increased cough started.   symptoms came on gradually with min proression and no change rx yet > has saba but not using.  Cough better with mucinex. No hemoptysis or epistaxis. Sob only with exertion rec When wheezing or coughing is worse, increase prednisone to 20 mg daily until better, then 10 mg day x 3 days and 5 mg per day x 3 days then 2.5 mg per day thereafter. mucinex dose is 600 take 2 every 12 hours as needed for cough/ congestion/ thick mucus   04/26/2013 f/u ov/[name] re: WG/ bronchiectasis p completed 10 days of levaquin 750 on 9/14  Chief Complaint  Patient presents with  . Follow-up    Breathing better. cough w/ yellow tint phlem.    Mucus never Never bloody and much lighter p levaquin, no need for saba on symb 160 2bid - wife concerned levaquin resistance developing. rec factive one daily x 5 days next time mucus gets nasty> did not tolerate Prednisone ceiling is 10 mg per day and floor is 5 mg daily  Only use your xopenex as a rescue medication   07/26/2013 f/u ov/[name] re: bronchiectasis / hj/o WG Chief Complaint  Patient presents with  . Follow-up    Breathing is unchanged since the last visit. Still has prod cough with moderate yellow sputum.  He also c/o chest tightness since he stoped levaquin approx 10 days ago.   Not using flutter or xopenex very much at all.  Maintaining floor of 5 mg daily  rec Cipro 750 twice daily x 10 days if you get nasty mucus Use the flutter valve and chest percussion    08/19/2013 f/u ov/[name] re: ? Recurrent WG Chief Complaint  Patient presents with  . Follow-up    Breathing is unchanged. Cough unchanged-prod cpugh w/ yellow phlem. On 2nd dose of cipro  now on day 5/10 cipro and less dark, never bloody, no sinus complaints, arthritis rash or fever or hematuria. >ANCA 1:80 titer, started on cytoxan   09/06/2013 Acute OV  Complains of  weakness, decreased appetite, temp up to 101, wheezing, increased SOB, prod cough with small amounts of yellow mucus x5days.  Began Cipro 750mg  1/27.  Fever is better . Was started on cytoxan on 1/19 for suspected wegeners flare (w/ ANCA positive 1:80) .  Appetite is poor but weight is stable.  Patient denies any hemoptysis, orthopnea, PND, or leg swelling.   ROS  The following are not active complaints unless bolded sore throat, dysphagia, dental problems, itching, sneezing,  nasal congestion or excess/ purulent secretions, ear ache,   fever, chills, sweats, unintended wt loss, pleuritic or exertional cp, hemoptysis,  orthopnea pnd or leg swelling, presyncope, palpitations, heartburn, abdominal pain, anorexia, nausea, vomiting, diarrhea  or change in bowel or urinary habits, change in stools or urine, dysuria,hematuria,  rash, arthralgias, visual complaints, headache, numbness weakness or ataxia or problems with walking or coordination,  change in mood/affect or memory.           Objective:   Physical Exam  wt   128  04/26/11 >07/08/2011  134> 134 11/28/2011 > 02/29/2012 136> 05/31/2012  136>  134 11/22/12 > 07/26/2013  136 > 08/21/2013  136 >133 09/06/2013  amb thin pleasant wm nad  SKIN: no rash, lesions  NODES: no lymphadenopathy  HEENT: [place]/AT, EOM- WNL, Conjuctivae- clear, PERRLA, TM-WNL, Nose- clear, Throat- clear and wnl, Mallampati III  NECK: Supple w/ fair ROM, JVD- none, normal  carotid impulses w/o bruits  Thyroid-  CHEST: min bilateral exp rhonchi  No resp distress HEART: RRR, no m/g/r heard  ABDOMEN: Soft and nl EXT:  No deformities or restrucitons  CXR  08/19/2013 :  1. There are stable findings consistent with COPD or reactive airway  disease with bibasilar bronchiectasis.  2. There are parenchymal densities that are more conspicuous today  in the inferior aspect of the right upper lobe. There is also  subpleural nodularity in the lateral aspect of the left mid lung  which is relatively stable.  3. There is no evidence of CHF nor of a pleural or pericardial  effusion.  4. There is a large hiatal hernia -partially intrathoracic stomach.        Assessment & Plan:

## 2013-09-06 NOTE — Telephone Encounter (Signed)
Called and spoke with pt. appt scheduled with TP this afternoon. Nothing further needed

## 2013-09-12 ENCOUNTER — Encounter: Payer: Self-pay | Admitting: Internal Medicine

## 2013-09-12 ENCOUNTER — Ambulatory Visit (INDEPENDENT_AMBULATORY_CARE_PROVIDER_SITE_OTHER): Payer: Medicare Other | Admitting: Internal Medicine

## 2013-09-12 VITALS — BP 94/60 | HR 63 | Temp 98.1°F | Ht 66.0 in | Wt 132.8 lb

## 2013-09-12 DIAGNOSIS — M3130 Wegener's granulomatosis without renal involvement: Secondary | ICD-10-CM

## 2013-09-12 DIAGNOSIS — J479 Bronchiectasis, uncomplicated: Secondary | ICD-10-CM

## 2013-09-12 MED ORDER — AMOXICILLIN-POT CLAVULANATE 875-125 MG PO TABS: 1.0000 | ORAL_TABLET | Freq: Two times a day (BID) | ORAL | Status: DC

## 2013-09-12 MED ORDER — TRAMADOL HCL 50 MG PO TABS: ORAL_TABLET | ORAL | Status: DC

## 2013-09-12 NOTE — Patient Instructions (Addendum)
Continue prednisone at 10 mg daily for now  For cough > mucinex dm up to 1200 mg every 12 hours and use the flutter valve - supplement with tramadol for severe or uncontrolled coughing  Augmentin 875 mg take one pill twice daily  X 10 days - take at breakfast and supper with large glass of water.  It would help reduce the usual side effects (diarrhea and yeast infections) if you ate cultured yogurt at lunch.   Please schedule a follow up office visit in 2  weeks, sooner if needed

## 2013-09-12 NOTE — Progress Notes (Signed)
Subjective:    Patient ID: Richard [name], male    DOB: Feb 12, 1936     MRN: [id number]  Brief patient profile:  81  yowm never smoker with dx of Longstanding bronchiectasis then dx with WG 03/2010 > requested establish with [name].   Admit [name] dx WG by pos c anca 03/2010, supportive tbbx but not vats   Admit [name] dx spont L Ptx 9/08/15/09 requiring Chest tube but no surgery   05/24/10--NP ov/ med reivew. Since last visit. has been hospitalized 05/12/2010-- 10/10/2011for Right pneumothorax., with underlying Wegener granulomatosis. and . Bronchiectasis. Pt was found to have Right Pneumo. Chest tube was placed. with improvement and removed 10/7. Xray on 10/10 w/ resolved pnuemo. He was continued on cytoxan and bactrim for PCP prophylaxis. rec. Prednisone 20 mg reduce to one half daily  Aspirin 81 mg one daily with breakfast but stop for any bleeding  See Patient Care Coordinator before leaving for cardiology appt > saw [name], ? LHC needed     October 13, 2010 --Presents for an acute office visit. Complains of productive cough with yellow mucus, sore throat, rattling in chest, low grade temp x3days. Currently on pred 1/2 every other day . OTC not helping. rec continue qod pred, rx with augmentin x 10 days   11/16/2010 ov all smiles, no change on days of prednisone, minimal discolored sputum.   Try off prednisone completely.  If you feel a lot worse over the next  week(worse breathing/ coughing/ nausea or loss of appetite) resume prednisone 5 mg one half daily until better then return back one half every other day    12/16/2010 ov/[name] cc no better on 5mg  dosed one half every other day (not the dose rec), still nauseated, still discolored p doxy complete May 7, sob in shower but not as much walking and no longer using 02, struggling with contingency plans. rec Work on inhaler technique:  If short of breath go ahead and use the xopenex hfa 2 puffs every 4 hours  Prednisone 5 mg 4 daily until better, then  2 daily x 5 days, then 1 daily thereafter   12/31/10 ov/[name], exac of bronchiectasis/wheeze no hemoptysis rx with pred burst and levaquin 750 x 5d  01/13/2011 ov/[name]  Cc sob and cough better now tapered prednisone to 5 mg each am using xop once daily around noon no problem with sleep or am exac of cough.   rec  02/11/2011 [name] cc recurrent hemoptysis on top of yellow that usually comes up esp in am x one year but no epistaxis, no cp or arthralgias or increased doe. rec Levaquin 750 mg x 5 days courses whenever mucus turns bloody, more dark or with high fever. Stop cytoxan and sulfasoxazole  Don't take aspirin if any active bleeding   03/16/2011 f/u ov/[name] cc no more blood,  Minimal yellow mucus, back on asa 81 without hemoptysis. No sob rec  Try prednisone 5 mg  One on even and a half on odd   Acute Visit 04/26/11 -[name]  Cc  increased chest congestion, yellow sputum beginning about 1 week ago. He took a levaquin x 5 days (9/11 - 9/15). Has a sore throat, getting better. Nasal drainage and sinus drainage worse over last 3 -4 days. He tells me that he cut grass last Thursday, may have started this. No sick contacts.  rec Please start levaquin daily for another 5 days Continue your current prednisone dosing  Start loratadine 10mg  daily until your next  visit    05/27/2011 f/u ov/[name] on 2.5 mg pred /day  cc still coughing, congested though much less purulent sputum p levaquin and no hemoptysis.  Does note fatigue > baseline  With doe worse x garbage cans to street and back and has not tried the xopenex at all. rec Try using xopenex as needed for short of breath Only take the loratidine (clariton) if needed for itching sneezing runny nose     07/19/2011 ER follow up 07/15/11 with increased cough, congestion and fever. Dx with acute bronchiectasis and possible influenza. He has started on Levaquin 750mg  x 5 days w/ last day today. Also given Tamiflu but did not fill this. Fevers are  decreasing. Cough and congesiton are improved but not gone. Still feels weak.CXR w/o acute changes.  Labs from ER reviewed with elevated WBC 11k, w/ left shift. Neg strep test. No influenza data found.  rec Extend Levaquin daily for 5 days  Mucinex DM Twice daily  As needed  Cough/congestion  Fluids and rest  Tylenol As needed  Fever   08/24/2011 f/u ov/[name] on Prednisone 5 mg one half each am  cc cough/ congestion back to baseline, no limiting sob. rec Try prednisone 5 mg one half pill alternate days to see if any worse respiratory symptoms or nausea, fatigue get worse Ok to adjust the mucinex dm to take up to 2 every 12 hours as needed For itching sneezing and runny nose, use the loratidine 10 mg (clariton)   OV 09/28/2011 ACUTE OFFICE VISIT WITH DR [name] with flare cough  rec Please take Take prednisone 40mg  once daily x 3 days, then 30mg  once daily x 3 days, then 20mg  once daily x 3 days, then prednisone 10mg  once daily  x 3 days and then 5mg  daily x 3 days and then to your baseline regimen of alternating  11/28/2011 f/u ov/[name] cc much better on higher doses of prednisone but worse w/in 5 days of floor of qod regimen with increase cough/ congestion minimally discolored sputum no epistaxis hemoptysis or aches/ pains x vague discomfort r ant chest under breast.  No hematuria rec Prednisone 20 mg per day until better, then 10 mg per day x 5 days, and then 5 mg per day.    05/31/2012 f/u ov/[name] cc no change in cough, yellowish, thick, worse in ams,  not  Bloody- no epistaxis, rash, arthralgias or sob.   rec Try prednisone 5 mg  One on even and one-half  on odd - if no change then 2 weeks before your next scheduled pulmonary visit take one half every day   10/11/2012 f/u ov/[name] still on one on even and half on odd prednisone(did not taper as per instructions, though no flare in symptoms), rare need for levaquin rec Prednisone 5 mg pill take one half daily  11/23/2012 f/u ov/[name] re  WG/ bronchiectasis Chief Complaint  Patient presents with  . Follow-up    Had PFT today-sob same,cough-yellow,wheezing occass.,  no hemoptysis, no real limiting sob though sedentary, no arthralgias or rash maintaining 2.5 mg daily No change prednisone 5 mg one half daily  > ok to try every other day if doing great May 1 flare rx levaquin and pred May 16 lap chole hoxworth   02/19/2013 f/u ov/[name] re WG and bronchietasis still on 5 mg one half daily  Chief Complaint  Patient presents with  . Follow-up    Pt c/o increased cough x 2 wks- prod with minimal yellow sputum. He also  has some increased SOB since increased cough started.   symptoms came on gradually with min proression and no change rx yet > has saba but not using.  Cough better with mucinex. No hemoptysis or epistaxis. Sob only with exertion rec When wheezing or coughing is worse, increase prednisone to 20 mg daily until better, then 10 mg day x 3 days and 5 mg per day x 3 days then 2.5 mg per day thereafter. mucinex dose is 600 take 2 every 12 hours as needed for cough/ congestion/ thick mucus   04/26/2013 f/u ov/[name] re: WG/ bronchiectasis p completed 10 days of levaquin 750 on 9/14  Chief Complaint  Patient presents with  . Follow-up    Breathing better. cough w/ yellow tint phlem.    Mucus never Never bloody and much lighter p levaquin, no need for saba on symb 160 2bid - wife concerned levaquin resistance developing. rec factive one daily x 5 days next time mucus gets nasty> did not tolerate Prednisone ceiling is 10 mg per day and floor is 5 mg daily  Only use your xopenex as a rescue medication   07/26/2013 f/u ov/[name] re: bronchiectasis / hj/o WG Chief Complaint  Patient presents with  . Follow-up    Breathing is unchanged since the last visit. Still has prod cough with moderate yellow sputum.  He also c/o chest tightness since he stoped levaquin approx 10 days ago.   Not using flutter or xopenex very much at all.  Maintaining floor of 5 mg daily  rec Cipro 750 twice daily x 10 days if you get nasty mucus Use the flutter valve and chest percussion    08/19/2013 f/u ov/[name] re: ? Recurrent WG Chief Complaint  Patient presents with  . Follow-up    Breathing is unchanged. Cough unchanged-prod cpugh w/ yellow phlem. On 2nd dose of cipro  now on day 5/10 cipro and less dark, never bloody, no sinus complaints, arthritis rash or fever or hematuria. >ANCA 1:80 titer, started on cytoxan   09/06/2013 Acute OV  Complains of  weakness, decreased appetite, temp up to 101, wheezing, increased SOB, prod cough with small amounts of yellow mucus x 5days.  Began Cipro 750mg  09/03/13  Fever is better . Was started on cytoxan on 1/19 for suspected wegeners flare (w/ ANCA positive 1:80) .  Appetite is poor but weight is stable.  rec Finish Cipro 750 twice daily x 10 days as directed.  Use the flutter valve and chest percussion  Fluids and rest .  Tylenol As needed   Labs and xray >> no change     09/12/2013 f/u ov/[name] re:  Chief Complaint  Patient presents with  . Follow-up    Pt c/o increased SOB, congestion and cough for the past 7-10 days. Cough is prod with minimal yellow sputum. Finished round of cipro on 09/11/13.     Changed pred to 10 mg daily on his own Not using flutter or tramadol   No obvious day to day or daytime variabilty or   cp or chest tightness, subjective wheeze overt sinus or hb symptoms. No unusual exp hx or h/o childhood pna/ asthma or knowledge of premature birth.  Sleeping ok without nocturnal  or early am exacerbation  of respiratory  c/o's or need for noct saba. Also denies any obvious fluctuation of symptoms with weather or environmental changes or other aggravating or alleviating factors except as outlined above   Current Medications, Allergies, Complete Past Medical History, Past Surgical History,  Family History, and Social History were reviewed in [employer]  record.  ROS  The following are not active complaints unless bolded sore throat, dysphagia, dental problems, itching, sneezing,  nasal congestion or excess/ purulent secretions, ear ache,   fever, chills, sweats, unintended wt loss, pleuritic or exertional cp, hemoptysis,  orthopnea pnd or leg swelling, presyncope, palpitations, heartburn, abdominal pain, anorexia, nausea, vomiting, diarrhea  or change in bowel or urinary habits, change in stools or urine, dysuria,hematuria,  rash, arthralgias, visual complaints, headache, numbness weakness or ataxia or problems with walking or coordination,  change in mood/affect or memory.               Objective:   Physical Exam  wt   128  04/26/11 >07/08/2011  134> 134 11/28/2011 > 02/29/2012 136> 05/31/2012  136>  134 11/22/12 > 07/26/2013  136 > 08/21/2013  136 >133 09/06/2013 >  09/12/2013  133  amb thin pleasant wm nad  SKIN: no rash, lesions  NODES: no lymphadenopathy  HEENT: [place]/AT, EOM- WNL, Conjuctivae- clear, PERRLA, TM-WNL, Nose- clear, Throat- clear and wnl, Mallampati III  NECK: Supple w/ fair ROM, JVD- none, normal carotid impulses w/o bruits Thyroid-  CHEST: min bilateral exp rhonchi  No resp distress HEART: RRR, no m/g/r heard  ABDOMEN: Soft and nl EXT:  No deformities or restrucitons      cxr  09/06/13  No significant change in a patchy bilateral nodular airspace process  with suggestion of cavitation of some of these nodular foci.  Findings are likely due to infection less likely neoplastic or  inflammatory etiology. Background of mild interstitial disease.        Assessment & Plan:

## 2013-09-13 NOTE — Assessment & Plan Note (Signed)
-   HFA 90% p coaching 12/16/2010      - PFT's 06/21/11   FEV1  1.60 (61%) ratio 48 and 10% better p B2,  DLCO 83%     - PFT's 11/22/2012 FEV1  1.52 (62%) arnd 46 and no change p B2 DLCO 89%       DDX of  difficult airways managment all start with A and  include Adherence, Ace Inhibitors, Acid Reflux, Active Sinus Disease, Alpha 1 Antitripsin deficiency, Anxiety masquerading as Airways dz,  ABPA,  allergy(esp in young), Aspiration (esp in elderly), Adverse effects of DPI,  Active smokers, plus two Bs  = Bronchiectasis and Beta blocker use..and one C= CHF  Adherence is always the initial "prime suspect" and is a multilayered concern that requires a "trust but verify" approach in every patient - starting with knowing how to use medications, especially inhalers, correctly, keeping up with refills and understanding the fundamental difference between maintenance and prns vs those medications only taken for a very short course and then stopped and not refilled.  - not following action plans at bottom of calendar > reviewed    Each maintenance medication was reviewed in detail including most importantly the difference between maintenance and as needed and under what circumstances the prns are to be used.  Please see instructions for details which were reviewed in writing and the patient given a copy.

## 2013-09-13 NOTE — Assessment & Plan Note (Signed)
-  Dx by TBBX/ pos anca 03/2010 > CTX started    - Off all prednisone with baseline esr 31 11/16/2010  > restarted 12/16/2010 with nausea and wt down to 115 but ok sats walking x 3 laps   - Recheck ANCA sent  12/16/2010  >  neg   -Trial off cytoxan starting 02/12/2011 >   03/16/11 stopped  - Prednisone ceiling is 10 mg per day and floor is 5 mg daily as of 04/26/13  - 08/19/13 ANCA POS 1:80 with increasing nodular dz > rx 08/21/2013 cytoxan 50 mg daily   Adequate control on present rx, reviewed > no change in rx needed  > recheck in 2 weeks

## 2013-09-15 ENCOUNTER — Other Ambulatory Visit: Payer: Self-pay | Admitting: Family Medicine

## 2013-09-23 ENCOUNTER — Telehealth: Payer: Self-pay | Admitting: Internal Medicine

## 2013-09-25 ENCOUNTER — Telehealth: Payer: Self-pay | Admitting: Internal Medicine

## 2013-09-25 NOTE — Telephone Encounter (Signed)
Message was created on 2.16.15 but no documentation was done [facility] TCB x1 to see if anything was needed

## 2013-09-25 NOTE — Telephone Encounter (Signed)
Per duplicate message created on 2.18.15: 09/25/2013 3:43 PM Phone (Incoming) Richard, [name] (Self) [phone] (H) PT RETURNING NURSES CALL/ ALSO CAN BE REACHE @ [phone]  Called spoke with patient who stated that he had finished his first 30 days of Cytoxan yesterday and would like to know if he is to continue this medication.  Pt is aware that an additional refill was sent to the pharmacy -- he has already picked this refill up and began taking that rx.  Pt has an upcoming ov w/ MW on 2.20.15.  Pt is aware that MW is out of the office until that date and will either wait on phone call or ov.  Dr [name] please advise, thank you.

## 2013-09-25 NOTE — Telephone Encounter (Signed)
Message taken on a duplicate previous message taken on 2.16.15 Above message copied and pasted to the original message Will sign off

## 2013-09-25 NOTE — Telephone Encounter (Signed)
Yes should continue ctx

## 2013-09-26 NOTE — Telephone Encounter (Signed)
Spoke with pt and notified of recs per MW  He verbalized understanding and nothing further needed

## 2013-09-27 ENCOUNTER — Other Ambulatory Visit (INDEPENDENT_AMBULATORY_CARE_PROVIDER_SITE_OTHER): Payer: Medicare Other

## 2013-09-27 ENCOUNTER — Ambulatory Visit (INDEPENDENT_AMBULATORY_CARE_PROVIDER_SITE_OTHER): Payer: Medicare Other | Admitting: Internal Medicine

## 2013-09-27 ENCOUNTER — Encounter: Payer: Self-pay | Admitting: Internal Medicine

## 2013-09-27 VITALS — BP 94/60 | HR 100 | Temp 98.3°F | Ht 66.0 in | Wt 132.0 lb

## 2013-09-27 DIAGNOSIS — J479 Bronchiectasis, uncomplicated: Secondary | ICD-10-CM

## 2013-09-27 DIAGNOSIS — M3130 Wegener's granulomatosis without renal involvement: Secondary | ICD-10-CM

## 2013-09-27 DIAGNOSIS — I10 Essential (primary) hypertension: Secondary | ICD-10-CM

## 2013-09-27 DIAGNOSIS — J310 Chronic rhinitis: Secondary | ICD-10-CM | POA: Insufficient documentation

## 2013-09-27 LAB — CBC WITH DIFFERENTIAL/PLATELET
Basophils Absolute: 0.1 10*3/uL (ref 0.0–0.1)
Basophils Relative: 0.6 % (ref 0.0–3.0)
EOS PCT: 0.8 % (ref 0.0–5.0)
Eosinophils Absolute: 0.1 10*3/uL (ref 0.0–0.7)
HCT: 37.8 % — ABNORMAL LOW (ref 39.0–52.0)
HEMOGLOBIN: 12.3 g/dL — AB (ref 13.0–17.0)
Lymphs Abs: 0.8 10*3/uL (ref 0.7–4.0)
MCHC: 32.5 g/dL (ref 30.0–36.0)
MCV: 92.5 fl (ref 78.0–100.0)
MONOS PCT: 6.3 % (ref 3.0–12.0)
Monocytes Absolute: 0.7 10*3/uL (ref 0.1–1.0)
NEUTROS ABS: 9.8 10*3/uL — AB (ref 1.4–7.7)
Platelets: 390 10*3/uL (ref 150.0–400.0)
RBC: 4.09 Mil/uL — AB (ref 4.22–5.81)
RDW: 14.1 % (ref 11.5–14.6)
WBC: 11.5 10*3/uL — AB (ref 4.5–10.5)

## 2013-09-27 LAB — BASIC METABOLIC PANEL
BUN: 26 mg/dL — ABNORMAL HIGH (ref 6–23)
CALCIUM: 8.6 mg/dL (ref 8.4–10.5)
CO2: 27 meq/L (ref 19–32)
Chloride: 106 mEq/L (ref 96–112)
Creatinine, Ser: 1.2 mg/dL (ref 0.4–1.5)
GFR: 63.92 mL/min (ref >60.00)
Glucose, Bld: 102 mg/dL — ABNORMAL HIGH (ref 70–99)
Potassium: 4.6 mEq/L (ref 3.5–5.1)
SODIUM: 140 meq/L (ref 135–145)

## 2013-09-27 MED ORDER — PREDNISONE 10 MG PO TABS: 10.0000 mg | ORAL_TABLET | Freq: Every day | ORAL | Status: DC

## 2013-09-27 NOTE — Progress Notes (Signed)
Subjective:    Patient ID: Richard [name], male    DOB: Feb 12, 1936     MRN: [id number]  Brief patient profile:  81  yowm never smoker with dx of Longstanding bronchiectasis then dx with WG 03/2010 > requested establish with [name].   Admit [name] dx WG by pos c anca 03/2010, supportive tbbx but not vats   Admit [name] dx spont L Ptx 9/08/15/09 requiring Chest tube but no surgery   05/24/10--NP ov/ med reivew. Since last visit. has been hospitalized 05/12/2010-- 10/10/2011for Right pneumothorax., with underlying Wegener granulomatosis. and . Bronchiectasis. Pt was found to have Right Pneumo. Chest tube was placed. with improvement and removed 10/7. Xray on 10/10 w/ resolved pnuemo. He was continued on cytoxan and bactrim for PCP prophylaxis. rec. Prednisone 20 mg reduce to one half daily  Aspirin 81 mg one daily with breakfast but stop for any bleeding  See Patient Care Coordinator before leaving for cardiology appt > saw [name], ? LHC needed     October 13, 2010 --Presents for an acute office visit. Complains of productive cough with yellow mucus, sore throat, rattling in chest, low grade temp x3days. Currently on pred 1/2 every other day . OTC not helping. rec continue qod pred, rx with augmentin x 10 days   11/16/2010 ov all smiles, no change on days of prednisone, minimal discolored sputum.   Try off prednisone completely.  If you feel a lot worse over the next  week(worse breathing/ coughing/ nausea or loss of appetite) resume prednisone 5 mg one half daily until better then return back one half every other day    12/16/2010 ov/Lequan Dobratz cc no better on 5mg  dosed one half every other day (not the dose rec), still nauseated, still discolored p doxy complete May 7, sob in shower but not as much walking and no longer using 02, struggling with contingency plans. rec Work on inhaler technique:  If short of breath go ahead and use the xopenex hfa 2 puffs every 4 hours  Prednisone 5 mg 4 daily until better, then  2 daily x 5 days, then 1 daily thereafter   12/31/10 ov/[name], exac of bronchiectasis/wheeze no hemoptysis rx with pred burst and levaquin 750 x 5d  01/13/2011 ov/Harim Bi  Cc sob and cough better now tapered prednisone to 5 mg each am using xop once daily around noon no problem with sleep or am exac of cough.   rec  02/11/2011 [name] cc recurrent hemoptysis on top of yellow that usually comes up esp in am x one year but no epistaxis, no cp or arthralgias or increased doe. rec Levaquin 750 mg x 5 days courses whenever mucus turns bloody, more dark or with high fever. Stop cytoxan and sulfasoxazole  Don't take aspirin if any active bleeding   03/16/2011 f/u ov/Danyela Posas cc no more blood,  Minimal yellow mucus, back on asa 81 without hemoptysis. No sob rec  Try prednisone 5 mg  One on even and a half on odd   Acute Visit 04/26/11 -[name]  Cc  increased chest congestion, yellow sputum beginning about 1 week ago. He took a levaquin x 5 days (9/11 - 9/15). Has a sore throat, getting better. Nasal drainage and sinus drainage worse over last 3 -4 days. He tells me that he cut grass last Thursday, may have started this. No sick contacts.  rec Please start levaquin daily for another 5 days Continue your current prednisone dosing  Start loratadine 10mg  daily until your next  visit    05/27/2011 f/u ov/[name] on 2.5 mg pred /day  cc still coughing, congested though much less purulent sputum p levaquin and no hemoptysis.  Does note fatigue > baseline  With doe worse x garbage cans to street and back and has not tried the xopenex at all. rec Try using xopenex as needed for short of breath Only take the loratidine (clariton) if needed for itching sneezing runny nose     07/19/2011 ER follow up 07/15/11 with increased cough, congestion and fever. Dx with acute bronchiectasis and possible influenza. He has started on Levaquin 750mg  x 5 days w/ last day today. Also given Tamiflu but did not fill this. Fevers are  decreasing. Cough and congesiton are improved but not gone. Still feels weak.CXR w/o acute changes.  Labs from ER reviewed with elevated WBC 11k, w/ left shift. Neg strep test. No influenza data found.  rec Extend Levaquin daily for 5 days  Mucinex DM Twice daily  As needed  Cough/congestion  Fluids and rest  Tylenol As needed  Fever   08/24/2011 f/u ov/[name] on Prednisone 5 mg one half each am  cc cough/ congestion back to baseline, no limiting sob. rec Try prednisone 5 mg one half pill alternate days to see if any worse respiratory symptoms or nausea, fatigue get worse Ok to adjust the mucinex dm to take up to 2 every 12 hours as needed For itching sneezing and runny nose, use the loratidine 10 mg (clariton)   OV 09/28/2011 ACUTE OFFICE VISIT WITH DR [name] with flare cough  rec Please take Take prednisone 40mg  once daily x 3 days, then 30mg  once daily x 3 days, then 20mg  once daily x 3 days, then prednisone 10mg  once daily  x 3 days and then 5mg  daily x 3 days and then to your baseline regimen of alternating  11/28/2011 f/u ov/[name] cc much better on higher doses of prednisone but worse w/in 5 days of floor of qod regimen with increase cough/ congestion minimally discolored sputum no epistaxis hemoptysis or aches/ pains x vague discomfort r ant chest under breast.  No hematuria rec Prednisone 20 mg per day until better, then 10 mg per day x 5 days, and then 5 mg per day.    05/31/2012 f/u ov/[name] cc no change in cough, yellowish, thick, worse in ams,  not  Bloody- no epistaxis, rash, arthralgias or sob.   rec Try prednisone 5 mg  One on even and one-half  on odd - if no change then 2 weeks before your next scheduled pulmonary visit take one half every day   10/11/2012 f/u ov/[name] still on one on even and half on odd prednisone(did not taper as per instructions, though no flare in symptoms), rare need for levaquin rec Prednisone 5 mg pill take one half daily  11/23/2012 f/u ov/[name] re  WG/ bronchiectasis Chief Complaint  Patient presents with  . Follow-up    Had PFT today-sob same,cough-yellow,wheezing occass.,  no hemoptysis, no real limiting sob though sedentary, no arthralgias or rash maintaining 2.5 mg daily No change prednisone 5 mg one half daily  > ok to try every other day if doing great May 1 flare rx levaquin and pred May 16 lap chole [place]   02/19/2013 f/u ov/Yuvin Bussiere re WG and bronchietasis still on 5 mg one half daily  Chief Complaint  Patient presents with  . Follow-up    Pt c/o increased cough x 2 wks- prod with minimal yellow sputum. He also  has some increased SOB since increased cough started.   symptoms came on gradually with min proression and no change rx yet > has saba but not using.  Cough better with mucinex. No hemoptysis or epistaxis. Sob only with exertion rec When wheezing or coughing is worse, increase prednisone to 20 mg daily until better, then 10 mg day x 3 days and 5 mg per day x 3 days then 2.5 mg per day thereafter. mucinex dose is 600 take 2 every 12 hours as needed for cough/ congestion/ thick mucus   04/26/2013 f/u ov/Jazae Gandolfi re: WG/ bronchiectasis p completed 10 days of levaquin 750 on 9/14  Chief Complaint  Patient presents with  . Follow-up    Breathing better. cough w/ yellow tint phlem.    Mucus never Never bloody and much lighter p levaquin, no need for saba on symb 160 2bid - wife concerned levaquin resistance developing. rec factive one daily x 5 days next time mucus gets nasty> did not tolerate Prednisone ceiling is 10 mg per day and floor is 5 mg daily  Only use your xopenex as a rescue medication   07/26/2013 f/u ov/Sundiata Ferrick re: bronchiectasis / hj/o WG Chief Complaint  Patient presents with  . Follow-up    Breathing is unchanged since the last visit. Still has prod cough with moderate yellow sputum.  He also c/o chest tightness since he stoped levaquin approx 10 days ago.   Not using flutter or xopenex very much at all.  Maintaining floor of 5 mg daily  rec Cipro 750 twice daily x 10 days if you get nasty mucus Use the flutter valve and chest percussion    08/19/2013 f/u ov/Yoselin Amerman re: ? Recurrent WG Chief Complaint  Patient presents with  . Follow-up    Breathing is unchanged. Cough unchanged-prod cpugh w/ yellow phlem. On 2nd dose of cipro  now on day 5/10 cipro and less dark, never bloody, no sinus complaints, arthritis rash or fever or hematuria. >ANCA 1:80 titer, started on cytoxan   09/06/2013 Acute OV  Complains of  weakness, decreased appetite, temp up to 101, wheezing, increased SOB, prod cough with small amounts of yellow mucus x 5days.  Began Cipro 750mg  09/03/13  Fever is better . Was started on cytoxan on 1/19 for suspected wegeners flare (w/ ANCA positive 1:80) .  Appetite is poor but weight is stable.  rec Finish Cipro 750 twice daily x 10 days as directed.  Use the flutter valve and chest percussion  Fluids and rest .  Tylenol As needed   Labs and xray >> no change     09/12/2013 f/u ov/Curtisha Bendix re:  Chief Complaint  Patient presents with  . Follow-up    Pt c/o increased SOB, congestion and cough for the past 7-10 days. Cough is prod with minimal yellow sputum. Finished round of cipro on 09/11/13.     Changed pred to 10 mg daily on his own Not using flutter or tramadol  rec Continue prednisone at 10 mg daily for now For cough > mucinex dm up to 1200 mg every 12 hours and use the flutter valve - supplement with tramadol for severe or uncontrolled coughing Augmentin 875 mg take one pill twice daily  X 10 days - take at breakfast and supper with large glass of water.  It would help reduce the usual side effects (diarrhea and yeast infections) if you ate cultured yogurt at lunch.   09/27/2013 f/u ov/Tenzin Pavon re:  WG on pred 10 and ctx  50 mg daily  Chief Complaint  Patient presents with  . Follow-up    Pt states cough and SOB seems worse since the last visit. Still producing yellow sputum. He  states has low grade temp every am.    assoc with nasal congestion but no epistaxis or hemoptysis   No obvious day to day or daytime variabilty or   cp or chest tightness, subjective wheeze overt   hb symptoms. No unusual exp hx or h/o childhood pna/ asthma or knowledge of premature birth.  Sleeping ok without nocturnal  or early am exacerbation  of respiratory  c/o's or need for noct saba. Also denies any obvious fluctuation of symptoms with weather or environmental changes or other aggravating or alleviating factors except as outlined above   Current Medications, Allergies, Complete Past Medical History, Past Surgical History, Family History, and Social History were reviewed in [employer] record.  ROS  The following are not active complaints unless bolded sore throat, dysphagia, dental problems, itching, sneezing,  nasal congestion or excess/ purulent secretions, ear ache,   fever, chills, sweats, unintended wt loss, pleuritic or exertional cp, hemoptysis,  orthopnea pnd or leg swelling, presyncope, palpitations, heartburn, abdominal pain, anorexia, nausea, vomiting, diarrhea  or change in bowel or urinary habits, change in stools or urine, dysuria,hematuria,  rash, arthralgias, visual complaints, headache, numbness weakness or ataxia or problems with walking or coordination,  change in mood/affect or memory.               Objective:   Physical Exam  wt   128  04/26/11 >07/08/2011  134> 134 11/28/2011 > 02/29/2012 136> 05/31/2012  136>  134 11/22/12 > 07/26/2013  136 > 08/21/2013  136 >133 09/06/2013 >  09/12/2013  133 > 132 09/27/2013  amb thin pleasant wm nad  SKIN: no rash, lesions  NODES: no lymphadenopathy  HEENT: [place]/AT, EOM- WNL, Conjuctivae- clear, PERRLA, TM-WNL, Nose- clear, Throat- clear and wnl, Mallampati III  NECK: Supple w/ fair ROM, JVD- none, normal carotid impulses w/o bruits Thyroid-  CHEST: min bilateral exp rhonchi  No resp distress HEART: RRR, no  m/g/r heard  ABDOMEN: Soft and nl EXT:  No deformities or restrucitons                Assessment & Plan:

## 2013-09-27 NOTE — Progress Notes (Signed)
Quick Note:  Spoke with pt and notified of results per Dr. [name]. Pt verbalized understanding and denied any questions.  ______ 

## 2013-09-27 NOTE — Patient Instructions (Addendum)
Stop Zebeta  Please see patient coordinator before you leave today  to schedule sinus CT  For cough use the mucinex dm up to 1200 mg every 12 hours and as much flutter valve as possible  For breathing use xopenex up to 2 puffs every 4 hours if needed and don't leave home wihout it  Please remember to go to the lab  department downstairs for your tests - we will call you with the results when they are available.  Please schedule a follow up office visit in 4 weeks, sooner if needed

## 2013-09-28 ENCOUNTER — Encounter: Payer: Self-pay | Admitting: Internal Medicine

## 2013-09-28 DIAGNOSIS — I10 Essential (primary) hypertension: Secondary | ICD-10-CM | POA: Insufficient documentation

## 2013-09-28 HISTORY — DX: Essential (primary) hypertension: I10

## 2013-09-28 NOTE — Assessment & Plan Note (Signed)
D/c bystolic - with wt loss bystolic not needed at this point

## 2013-09-28 NOTE — Assessment & Plan Note (Signed)
-   HFA 90% p coaching 12/16/2010      - PFT's 06/21/11   FEV1  1.60 (61%) ratio 48 and 10% better p B2,  DLCO 83%     - PFT's 11/22/2012 FEV1  1.52 (62%) arnd 46 and no change p B2 DLCO 89%  Reminded re max rx for cough/ sob per action plan on med calendar, reviewed (esp the need to try xopenex prn sob)

## 2013-09-28 NOTE — Assessment & Plan Note (Signed)
-  Dx by TBBX/ pos anca 03/2010 > CTX started    - Off all prednisone with baseline esr 31 11/16/2010  > restarted 12/16/2010 with nausea and wt down to 115 but ok sats walking x 3 laps   - Recheck ANCA sent  12/16/2010  >  neg   -Trial off cytoxan starting 02/12/2011 >   03/16/11 stopped  - Prednisone ceiling is 10 mg per day and floor is 5 mg daily as of 04/26/13  - 08/19/13 ANCA POS 1:80 with increasing nodular dz > rx 08/21/2013 cytoxan 50 mg daily  Too soon to tell what difference if any the ctx is making but certainly not having any adverse effects at this relatively low dose > no change rx feasible for now    Each maintenance medication was reviewed in detail including most importantly the difference between maintenance and as needed and under what circumstances the prns are to be used. This was done in the context of a medication calendar review which provided the patient with a user-friendly unambiguous mechanism for medication administration and reconciliation and provides an action plan for all active problems. It is critical that this be shown to every doctor  for modification during the office visit if necessary so the patient can use it as a working document.

## 2013-09-28 NOTE — Assessment & Plan Note (Signed)
Poorly controlled symptoms no response to augmentin/ pred ? Could have WG involvement > sinus ct and consider ent eval next

## 2013-10-01 ENCOUNTER — Other Ambulatory Visit: Payer: Medicare Other

## 2013-10-01 IMAGING — CR DG CHEST 2V
2 series · 2 of 2 positions shown · non-contrast
Comparison: October 11, 2012

CLINICAL DATA: Cough, chest pain, shortness of breath

CHEST - 2 VIEW

[view not recorded (1 of 2)]
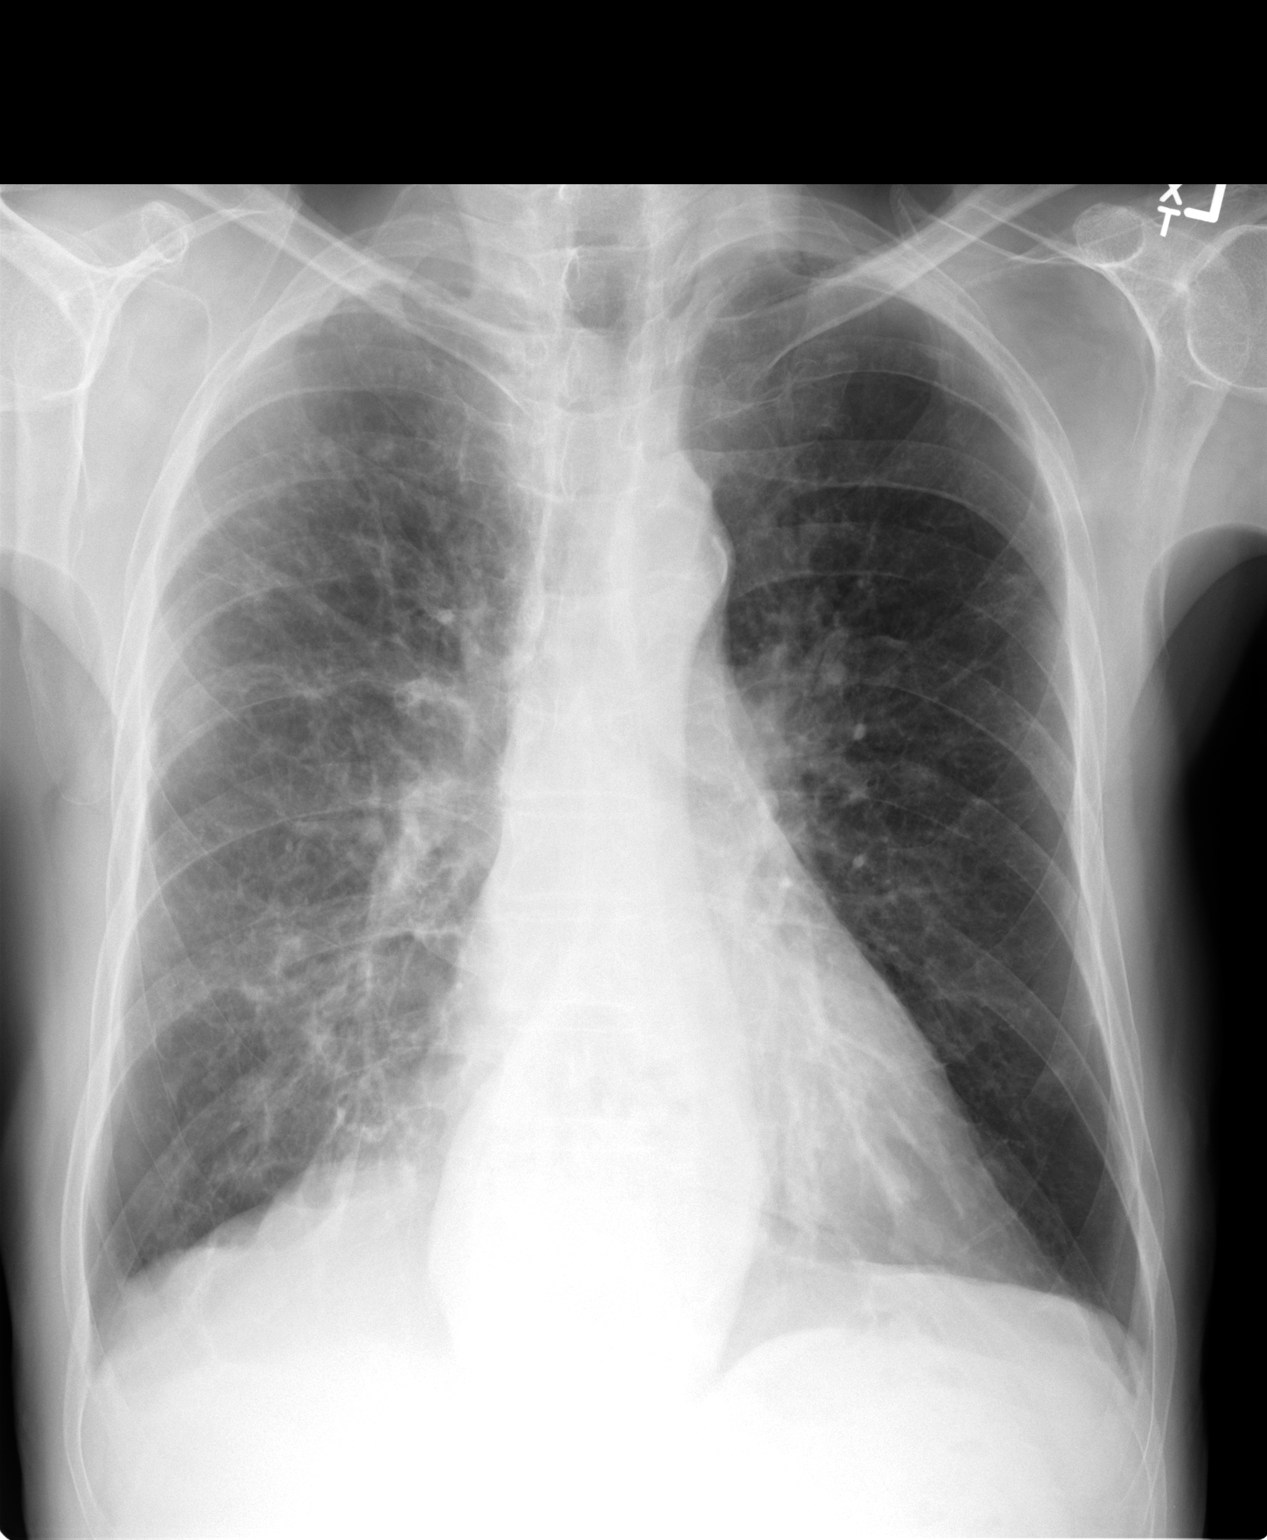

[view not recorded (2 of 2)]
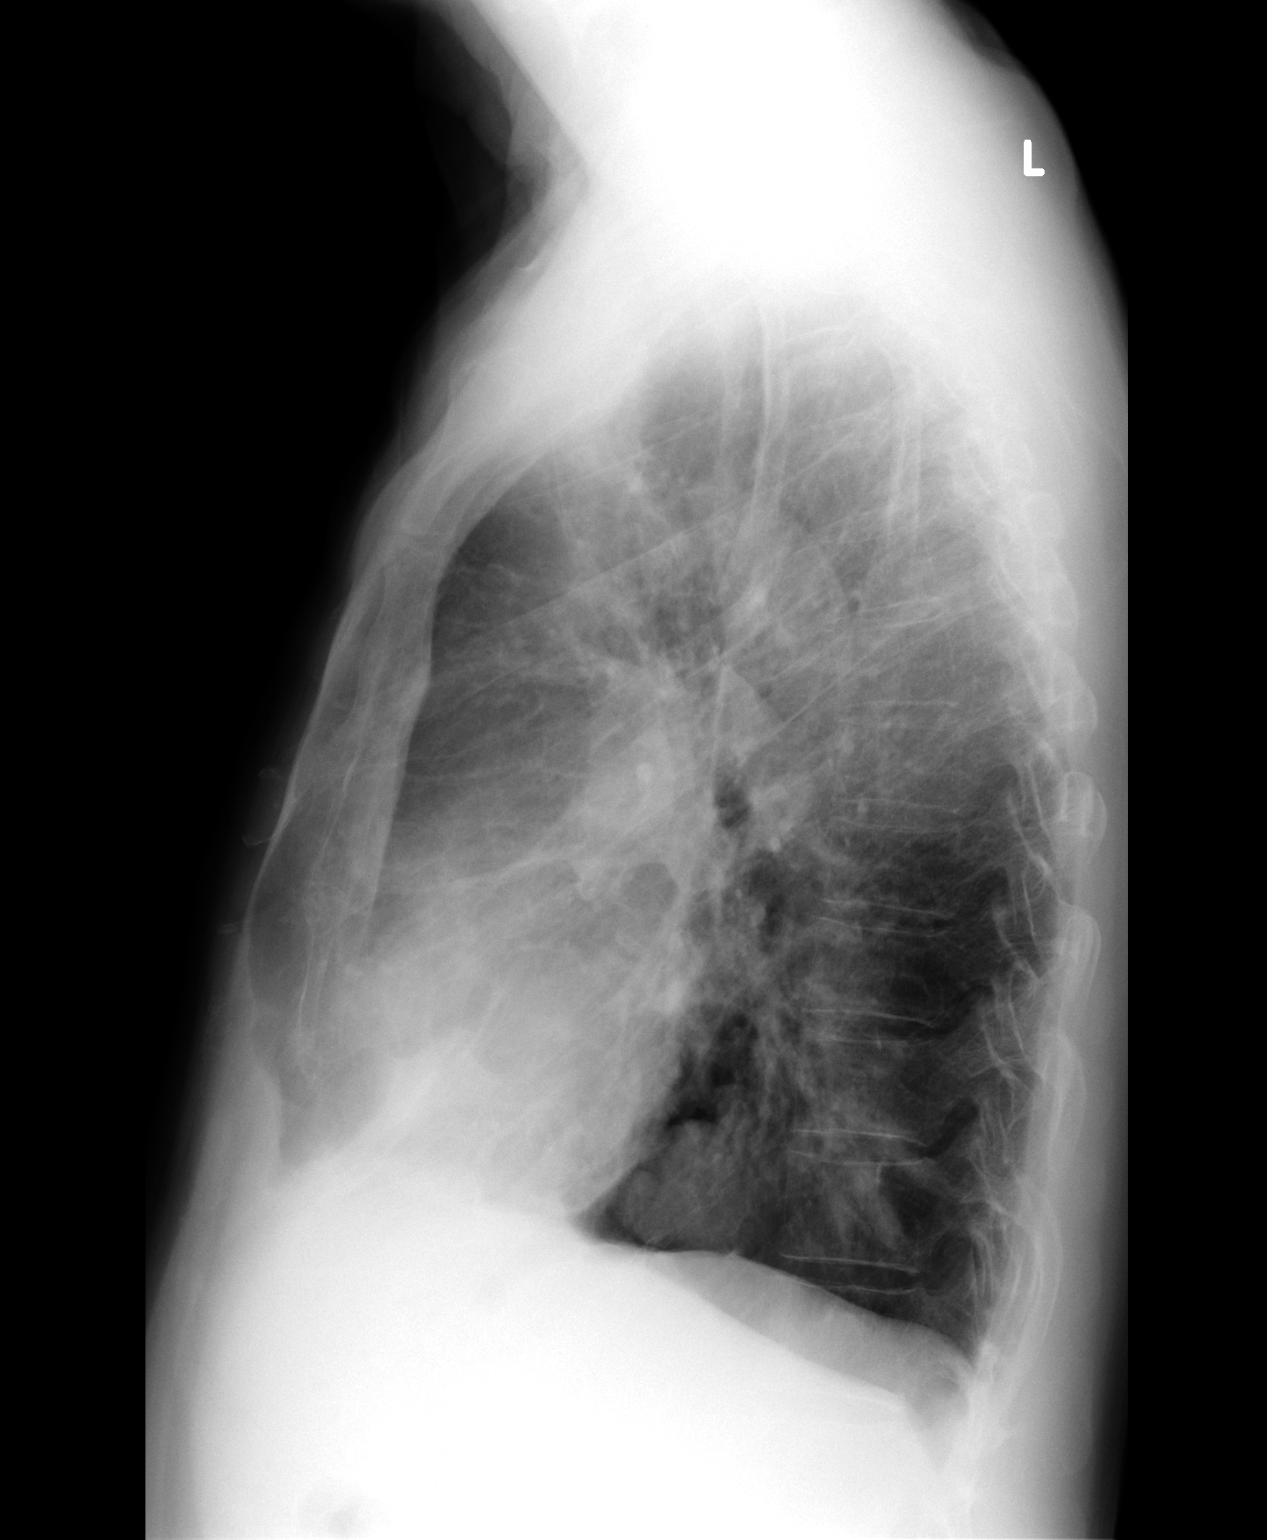

[2 of 2 positions shown; findings below may reference images not displayed]

FINDINGS: There are chronic increased pulmonary interstitium within
the right lung and in the left upper lobe unchanged.  There is no
pleural effusion or pulmonary edema.  The mediastinal contour and
cardiac silhouette are stable.  There is a hiatal hernia.  The soft
tissue and osseous structures are stable.
IMPRESSION: Stable chronic changes.  No acute abnormality identified.

## 2013-10-04 ENCOUNTER — Emergency Department (HOSPITAL_COMMUNITY): Payer: Medicare Other

## 2013-10-04 ENCOUNTER — Inpatient Hospital Stay (HOSPITAL_COMMUNITY)
Admission: EM | Admit: 2013-10-04 | Discharge: 2013-10-07 | DRG: 543 | Disposition: A | Discharge status: Home / Self Care | Payer: Medicare Other | Attending: Internal Medicine | Admitting: Internal Medicine

## 2013-10-04 ENCOUNTER — Encounter (HOSPITAL_COMMUNITY): Payer: Self-pay | Admitting: Emergency Medicine

## 2013-10-04 ENCOUNTER — Emergency Department (HOSPITAL_COMMUNITY)
Admit: 2013-10-04 | Discharge: 2013-10-04 | Disposition: A | Discharge status: Home / Self Care | Payer: Medicare Other | Attending: Internal Medicine | Admitting: Internal Medicine

## 2013-10-04 ENCOUNTER — Telehealth: Payer: Self-pay | Admitting: Internal Medicine

## 2013-10-04 ENCOUNTER — Other Ambulatory Visit: Payer: Medicare Other

## 2013-10-04 DIAGNOSIS — R0602 Shortness of breath: Secondary | ICD-10-CM

## 2013-10-04 DIAGNOSIS — R1319 Other dysphagia: Secondary | ICD-10-CM

## 2013-10-04 DIAGNOSIS — R531 Weakness: Secondary | ICD-10-CM

## 2013-10-04 DIAGNOSIS — Z7982 Long term (current) use of aspirin: Secondary | ICD-10-CM

## 2013-10-04 DIAGNOSIS — Z79899 Other long term (current) drug therapy: Secondary | ICD-10-CM

## 2013-10-04 DIAGNOSIS — M129 Arthropathy, unspecified: Secondary | ICD-10-CM | POA: Diagnosis present

## 2013-10-04 DIAGNOSIS — I471 Supraventricular tachycardia, unspecified: Secondary | ICD-10-CM | POA: Diagnosis present

## 2013-10-04 DIAGNOSIS — J471 Bronchiectasis with (acute) exacerbation: Secondary | ICD-10-CM

## 2013-10-04 DIAGNOSIS — R5381 Other malaise: Secondary | ICD-10-CM

## 2013-10-04 DIAGNOSIS — J310 Chronic rhinitis: Secondary | ICD-10-CM

## 2013-10-04 DIAGNOSIS — I498 Other specified cardiac arrhythmias: Secondary | ICD-10-CM

## 2013-10-04 DIAGNOSIS — I10 Essential (primary) hypertension: Secondary | ICD-10-CM

## 2013-10-04 DIAGNOSIS — J329 Chronic sinusitis, unspecified: Secondary | ICD-10-CM | POA: Diagnosis present

## 2013-10-04 DIAGNOSIS — K219 Gastro-esophageal reflux disease without esophagitis: Secondary | ICD-10-CM | POA: Diagnosis present

## 2013-10-04 DIAGNOSIS — M479 Spondylosis, unspecified: Secondary | ICD-10-CM

## 2013-10-04 DIAGNOSIS — J479 Bronchiectasis, uncomplicated: Secondary | ICD-10-CM

## 2013-10-04 DIAGNOSIS — D649 Anemia, unspecified: Secondary | ICD-10-CM

## 2013-10-04 DIAGNOSIS — E039 Hypothyroidism, unspecified: Secondary | ICD-10-CM

## 2013-10-04 DIAGNOSIS — R0609 Other forms of dyspnea: Secondary | ICD-10-CM

## 2013-10-04 DIAGNOSIS — Z87898 Personal history of other specified conditions: Secondary | ICD-10-CM

## 2013-10-04 DIAGNOSIS — IMO0002 Reserved for concepts with insufficient information to code with codable children: Secondary | ICD-10-CM

## 2013-10-04 DIAGNOSIS — E785 Hyperlipidemia, unspecified: Secondary | ICD-10-CM

## 2013-10-04 DIAGNOSIS — R0989 Other specified symptoms and signs involving the circulatory and respiratory systems: Secondary | ICD-10-CM

## 2013-10-04 DIAGNOSIS — E559 Vitamin D deficiency, unspecified: Secondary | ICD-10-CM

## 2013-10-04 DIAGNOSIS — M3130 Wegener's granulomatosis without renal involvement: Principal | ICD-10-CM

## 2013-10-04 DIAGNOSIS — H9190 Unspecified hearing loss, unspecified ear: Secondary | ICD-10-CM | POA: Diagnosis present

## 2013-10-04 DIAGNOSIS — R0600 Dyspnea, unspecified: Secondary | ICD-10-CM

## 2013-10-04 DIAGNOSIS — Z825 Family history of asthma and other chronic lower respiratory diseases: Secondary | ICD-10-CM

## 2013-10-04 DIAGNOSIS — R05 Cough: Secondary | ICD-10-CM

## 2013-10-04 DIAGNOSIS — R059 Cough, unspecified: Secondary | ICD-10-CM

## 2013-10-04 DIAGNOSIS — J9610 Chronic respiratory failure, unspecified whether with hypoxia or hypercapnia: Secondary | ICD-10-CM

## 2013-10-04 DIAGNOSIS — R5383 Other fatigue: Secondary | ICD-10-CM

## 2013-10-04 LAB — URINALYSIS, ROUTINE W REFLEX MICROSCOPIC
Bilirubin Urine: NEGATIVE
GLUCOSE, UA: NEGATIVE mg/dL
Ketones, ur: NEGATIVE mg/dL
Leukocytes, UA: NEGATIVE
Nitrite: NEGATIVE
PH: 6.5 (ref 5.0–8.0)
PROTEIN: NEGATIVE mg/dL
Specific Gravity, Urine: 1.017 (ref 1.005–1.030)
Urobilinogen, UA: 0.2 mg/dL (ref 0.0–1.0)

## 2013-10-04 LAB — CBC WITH DIFFERENTIAL/PLATELET
BASOS PCT: 0 % (ref 0–1)
Basophils Absolute: 0 10*3/uL (ref 0.0–0.1)
EOS ABS: 0.1 10*3/uL (ref 0.0–0.7)
Eosinophils Relative: 1 % (ref 0–5)
HEMATOCRIT: 35.6 % — AB (ref 39.0–52.0)
HEMOGLOBIN: 11.3 g/dL — AB (ref 13.0–17.0)
LYMPHS ABS: 0.8 10*3/uL (ref 0.7–4.0)
Lymphocytes Relative: 8 % — ABNORMAL LOW (ref 12–46)
MCH: 29.3 pg (ref 26.0–34.0)
MCHC: 31.7 g/dL (ref 30.0–36.0)
MCV: 92.2 fL (ref 78.0–100.0)
MONO ABS: 0.9 10*3/uL (ref 0.1–1.0)
Monocytes Relative: 10 % (ref 3–12)
Neutro Abs: 7.6 10*3/uL (ref 1.7–7.7)
Neutrophils Relative %: 81 % — ABNORMAL HIGH (ref 43–77)
Platelets: 360 10*3/uL (ref 150–400)
RBC: 3.86 MIL/uL — AB (ref 4.22–5.81)
RDW: 14.5 % (ref 11.5–15.5)
WBC: 9.4 10*3/uL (ref 4.0–10.5)

## 2013-10-04 LAB — COMPREHENSIVE METABOLIC PANEL
ALBUMIN: 2.6 g/dL — AB (ref 3.5–5.2)
ALK PHOS: 78 U/L (ref 39–117)
ALT: 32 U/L (ref 0–53)
AST: 28 U/L (ref 0–37)
BILIRUBIN TOTAL: 0.6 mg/dL (ref 0.3–1.2)
BUN: 25 mg/dL — ABNORMAL HIGH (ref 6–23)
CHLORIDE: 102 meq/L (ref 96–112)
CO2: 21 mEq/L (ref 19–32)
CREATININE: 1.18 mg/dL (ref 0.50–1.35)
Calcium: 8.7 mg/dL (ref 8.4–10.5)
GFR calc non Af Amer: 59 mL/min — ABNORMAL LOW (ref >90)
GFR, EST AFRICAN AMERICAN: 68 mL/min — AB (ref >90)
Glucose, Bld: 153 mg/dL — ABNORMAL HIGH (ref 70–99)
POTASSIUM: 4.2 meq/L (ref 3.7–5.3)
Sodium: 139 mEq/L (ref 137–147)
TOTAL PROTEIN: 6.2 g/dL (ref 6.0–8.3)

## 2013-10-04 LAB — URINE MICROSCOPIC-ADD ON

## 2013-10-04 LAB — CBC
HEMATOCRIT: 32.5 % — AB (ref 39.0–52.0)
Hemoglobin: 10.3 g/dL — ABNORMAL LOW (ref 13.0–17.0)
MCH: 29.3 pg (ref 26.0–34.0)
MCHC: 31.7 g/dL (ref 30.0–36.0)
MCV: 92.6 fL (ref 78.0–100.0)
Platelets: 363 10*3/uL (ref 150–400)
RBC: 3.51 MIL/uL — ABNORMAL LOW (ref 4.22–5.81)
RDW: 14.6 % (ref 11.5–15.5)
WBC: 8.8 10*3/uL (ref 4.0–10.5)

## 2013-10-04 LAB — PRO B NATRIURETIC PEPTIDE: Pro B Natriuretic peptide (BNP): 387.5 pg/mL (ref 0–450)

## 2013-10-04 LAB — CREATININE, SERUM
Creatinine, Ser: 1.17 mg/dL (ref 0.50–1.35)
GFR, EST AFRICAN AMERICAN: 69 mL/min — AB (ref >90)
GFR, EST NON AFRICAN AMERICAN: 59 mL/min — AB (ref >90)

## 2013-10-04 LAB — TROPONIN I: Troponin I: <0.3 ng/mL (ref <0.30)

## 2013-10-04 LAB — D-DIMER, QUANTITATIVE (NOT AT ARMC): D DIMER QUANT: 3.09 ug{FEU}/mL — AB (ref 0.00–0.48)

## 2013-10-04 MED ORDER — CYCLOPHOSPHAMIDE 50 MG PO TABS: 50.0000 mg | ORAL_TABLET | Freq: Every day | ORAL | Status: DC

## 2013-10-04 MED ORDER — LORATADINE 10 MG PO TABS
10.0000 mg | ORAL_TABLET | Freq: Every day | ORAL | Status: DC | PRN
  Filled 2013-10-04: qty 1

## 2013-10-04 MED ORDER — IOHEXOL 350 MG/ML SOLN
100.0000 mL | Freq: Once | INTRAVENOUS | Status: AC | PRN
  Administered 2013-10-04: 100 mL via INTRAVENOUS

## 2013-10-04 MED ORDER — ONDANSETRON HCL 4 MG PO TABS: 4.0000 mg | ORAL_TABLET | Freq: Four times a day (QID) | ORAL | Status: DC | PRN

## 2013-10-04 MED ORDER — METHYLPREDNISOLONE SODIUM SUCC 125 MG IJ SOLR
80.0000 mg | Freq: Two times a day (BID) | INTRAMUSCULAR | Status: DC
  Administered 2013-10-04 – 2013-10-07 (×6): 80 mg via INTRAVENOUS
  Filled 2013-10-04 (×8): qty 1.28

## 2013-10-04 MED ORDER — SODIUM CHLORIDE 0.9 % IV SOLN
INTRAVENOUS | Status: AC
  Administered 2013-10-04: 16:00:00 via INTRAVENOUS

## 2013-10-04 MED ORDER — SODIUM CHLORIDE 0.9 % IJ SOLN
3.0000 mL | Freq: Two times a day (BID) | INTRAMUSCULAR | Status: DC
  Administered 2013-10-05 – 2013-10-06 (×2): 3 mL via INTRAVENOUS

## 2013-10-04 MED ORDER — SODIUM CHLORIDE 0.9 % IJ SOLN: 3.0000 mL | INTRAMUSCULAR | Status: DC | PRN

## 2013-10-04 MED ORDER — SODIUM CHLORIDE 0.9 % IJ SOLN
3.0000 mL | Freq: Two times a day (BID) | INTRAMUSCULAR | Status: DC
  Administered 2013-10-05: 3 mL via INTRAVENOUS

## 2013-10-04 MED ORDER — BISACODYL 5 MG PO TBEC
5.0000 mg | DELAYED_RELEASE_TABLET | Freq: Every day | ORAL | Status: DC | PRN
Start: 2013-10-04 — End: 2013-10-07

## 2013-10-04 MED ORDER — ASPIRIN 81 MG PO CHEW
81.0000 mg | CHEWABLE_TABLET | Freq: Every evening | ORAL | Status: DC
  Administered 2013-10-04 – 2013-10-06 (×3): 81 mg via ORAL
  Filled 2013-10-04 (×4): qty 1

## 2013-10-04 MED ORDER — ONDANSETRON HCL 4 MG/2ML IJ SOLN: 4.0000 mg | Freq: Four times a day (QID) | INTRAMUSCULAR | Status: DC | PRN

## 2013-10-04 MED ORDER — ENOXAPARIN SODIUM 40 MG/0.4ML ~~LOC~~ SOLN
40.0000 mg | SUBCUTANEOUS | Status: DC
  Administered 2013-10-04 – 2013-10-06 (×3): 40 mg via SUBCUTANEOUS
  Filled 2013-10-04 (×4): qty 0.4

## 2013-10-04 MED ORDER — ALBUTEROL SULFATE (2.5 MG/3ML) 0.083% IN NEBU: 3.0000 mL | INHALATION_SOLUTION | RESPIRATORY_TRACT | Status: DC | PRN

## 2013-10-04 MED ORDER — TRAMADOL HCL 50 MG PO TABS: 50.0000 mg | ORAL_TABLET | Freq: Four times a day (QID) | ORAL | Status: DC | PRN

## 2013-10-04 MED ORDER — VANCOMYCIN HCL IN DEXTROSE 1-5 GM/200ML-% IV SOLN
1000.0000 mg | Freq: Once | INTRAVENOUS | Status: AC
  Administered 2013-10-04: 1000 mg via INTRAVENOUS
  Filled 2013-10-04: qty 200

## 2013-10-04 MED ORDER — ACETAMINOPHEN 325 MG PO TABS: 650.0000 mg | ORAL_TABLET | Freq: Four times a day (QID) | ORAL | Status: DC | PRN

## 2013-10-04 MED ORDER — SODIUM CHLORIDE 0.9 % IV SOLN: 250.0000 mL | INTRAVENOUS | Status: DC | PRN

## 2013-10-04 MED ORDER — DEXTROSE 5 % IV SOLN
1.0000 g | Freq: Two times a day (BID) | INTRAVENOUS | Status: DC
  Administered 2013-10-04 – 2013-10-07 (×6): 1 g via INTRAVENOUS
  Filled 2013-10-04 (×7): qty 1

## 2013-10-04 MED ORDER — ACETAMINOPHEN 650 MG RE SUPP: 650.0000 mg | Freq: Four times a day (QID) | RECTAL | Status: DC | PRN

## 2013-10-04 MED ORDER — VANCOMYCIN HCL 500 MG IV SOLR
500.0000 mg | Freq: Two times a day (BID) | INTRAVENOUS | Status: DC
  Administered 2013-10-05 – 2013-10-06 (×4): 500 mg via INTRAVENOUS
  Filled 2013-10-04 (×5): qty 500

## 2013-10-04 MED ORDER — TAMSULOSIN HCL 0.4 MG PO CAPS
0.4000 mg | ORAL_CAPSULE | Freq: Every evening | ORAL | Status: DC
  Administered 2013-10-04 – 2013-10-06 (×3): 0.4 mg via ORAL
  Filled 2013-10-04 (×4): qty 1

## 2013-10-04 MED ORDER — DM-GUAIFENESIN ER 30-600 MG PO TB12
2.0000 | ORAL_TABLET | Freq: Two times a day (BID) | ORAL | Status: DC
  Administered 2013-10-04 – 2013-10-07 (×6): 2 via ORAL
  Filled 2013-10-04 (×7): qty 2

## 2013-10-04 MED ORDER — BUDESONIDE-FORMOTEROL FUMARATE 160-4.5 MCG/ACT IN AERO
2.0000 | INHALATION_SPRAY | Freq: Two times a day (BID) | RESPIRATORY_TRACT | Status: DC
Start: 2013-10-04 — End: 2013-10-07
  Administered 2013-10-04 – 2013-10-07 (×6): 2 via RESPIRATORY_TRACT
  Filled 2013-10-04: qty 6

## 2013-10-04 MED ORDER — DEXTROMETHORPHAN POLISTIREX 30 MG/5ML PO LQCR
30.0000 mg | Freq: Two times a day (BID) | ORAL | Status: DC | PRN
  Filled 2013-10-04: qty 5

## 2013-10-04 MED ORDER — DUTASTERIDE 0.5 MG PO CAPS
0.5000 mg | ORAL_CAPSULE | ORAL | Status: DC
  Administered 2013-10-05 – 2013-10-07 (×2): 0.5 mg via ORAL
  Filled 2013-10-04 (×2): qty 1

## 2013-10-04 MED ORDER — SODIUM CHLORIDE 0.9 % IV BOLUS (SEPSIS)
1000.0000 mL | Freq: Once | INTRAVENOUS | Status: AC
  Administered 2013-10-04: 1000 mL via INTRAVENOUS

## 2013-10-04 MED ORDER — PANTOPRAZOLE SODIUM 40 MG PO TBEC
40.0000 mg | DELAYED_RELEASE_TABLET | Freq: Every day | ORAL | Status: DC
  Administered 2013-10-05 – 2013-10-07 (×3): 40 mg via ORAL
  Filled 2013-10-04 (×3): qty 1

## 2013-10-04 MED ORDER — PSYLLIUM 95 % PO PACK
1.0000 | PACK | Freq: Every day | ORAL | Status: DC
  Administered 2013-10-05 – 2013-10-06 (×2): 1 via ORAL
  Filled 2013-10-04 (×3): qty 1

## 2013-10-04 NOTE — Progress Notes (Signed)
   CARE MANAGEMENT ED NOTE 10/04/2013  Patient:  DYAN, CREELMAN   Account Number:  [id number]  Date Initiated:  10/04/2013  Documentation initiated by:  Livia Snellen  Subjective/Objective Assessment:   Patient presents to ED with shortness of breath and cough.     Subjective/Objective Assessment Detail:     Action/Plan:   Action/Plan Detail:   Anticipated DC Date:       Status Recommendation to Physician:   Result of Recommendation:    Other ED [place]  Other  PCP issues    Choice offered to / List presented to:            Status of service:  Completed, signed off  ED Comments:   ED Comments Detail:  EDCM spoke to patient and his wife Enid Derry at bedside. shirley's phone number [phone] or [phone]. Patient lives at home wih his wife.  Patient does not have any medical equipmwnt at home. There is a walker, bedside ommode and a shower chair at patient's home but this equipment is the [patient's wife's from her previous surgery.  Patient is currently not receiving any home health services at this time.  Patient's wife reports that patient did have oxygen at home in the past but no longer has it.  Advised patient's wife unit CM to follow for discharge needs.  Patient's wife thankful for services. Patient being transferred to unit.

## 2013-10-04 NOTE — ED Notes (Addendum)
Renita Papa, RN and Rancour, EDP at bedside. Per Richard [name] pt had episode of SVT HR 180. Pt HR 124 at present time, 95% O2 RA, 23 respirations. Pt reports SOB, cough with yellow sputum, and weakness since prescribed new medication, cyclophosphamide on 1/19.

## 2013-10-04 NOTE — Consult Note (Signed)
Name: Richard [name] MRN: [id number] DOB: [birth year]    ADMISSION DATE:  10/04/2013 CONSULTATION DATE:  2/27  REFERRING MD :  Jerilee Hoh PRIMARY SERVICE: [facility]  BRIEF PATIENT DESCRIPTION:  85 M followed by MW in [facility] pulm office with Wegener's granulomatosis adm 2/27 by [facility] with relapsing symptoms of several wks duration   HISTORY OF PRESENT ILLNESS:   Dr Gustavus Bryant recent progress note from 2/20 documents in detail this gentleman's hx of WG and has been reviewed in detail. He was initially diagnosed 03/2010. He was treated with cyclophosphamide and prednisone. He went into remission and cyclophosphamide was discontinued in early 2013. He was seen in office in mid January of this year with development of weakness, anorexia, intermittent fever and malaise. He was started back on cyclophosphamide 50 mg daily. His symptoms have progressed and he attributes worsening weakness, cough, anorexia and SOB to the cyclophosphamide. He presented to [facility] ED with same symptoms of weakness, malaise, cough productive of yellow sputum, SOB, anorexia all of several weeks duration. He denies hemoptysis, CP, N/V/D, abd pain, dysuria. He had an episode of PSVT while in ED. Admitted to [facility] service. PCCM asked to address therapy of WG and resp symptoms   PAST MEDICAL HISTORY :  Past Medical History  Diagnosis Date  . GERD (gastroesophageal reflux disease)   . Arthritis   . Cataract   . Ulcer   . BPH (benign prostatic hyperplasia)   . Wegener's granulomatosis 2011    sees Dr. [name]   . Peptic stricture of esophagus   . Anemia   . Hiatal hernia   . Adenomatous colon polyp 08/2011  . Bronchiectasis   . On home oxygen therapy 12-18-12    uses 2 l/m nasally at bedtime  . HOH (hard of hearing) 12-18-12    bilaterally  . HBP (high blood pressure) 09/28/2013   Past Surgical History  Procedure Laterality Date  . Hernia repair    . Eye surgery      cataracts removed.   . Colonoscopy  08-16-11    per Dr. Fuller Plan,  diverticulosis and polyps, repeat in 5 yrs   . Esophagogastroduodenoscopy (egd) with esophageal dilation  11-29-10    per Dr. Fuller Plan   . Cataract extraction, bilateral  12-18-12    bilateral  . Cholecystectomy N/A 12/21/2012    Procedure: LAPAROSCOPIC CHOLECYSTECTOMY WITH INTRAOPERATIVE CHOLANGIOGRAM;  Surgeon: Edward Jolly, MD;  Location: [facility] ORS;  Service: General;  Laterality: N/A;   Prior to Admission medications   Medication Sig Start Date End Date Taking? Authorizing Provider  aspirin 81 MG tablet Take 81 mg by mouth every evening.    Yes Historical Provider, MD  b complex vitamins tablet Take 1 tablet by mouth daily.     Yes Historical Provider, MD  bisacodyl (DULCOLAX) 5 MG EC tablet Take 5 mg by mouth daily as needed. CONSTIPATION   Yes Historical Provider, MD  budesonide-formoterol (SYMBICORT) 160-4.5 MCG/ACT inhaler Inhale 2 puffs into the lungs 2 (two) times daily.   Yes Historical Provider, MD  ciprofloxacin (CIPRO) 750 MG tablet Take 1 tablet by mouth 2 (two) times daily. 10/03/13  Yes Historical Provider, MD  cyclophosphamide (CYTOXAN) 50 MG tablet Take 1 tablet (50 mg total) by mouth daily. Give on an empty stomach 1 hour before or 2 hours after meals. 08/22/13  Yes Tanda Rockers, MD  dextromethorphan (DELSYM) 30 MG/5ML liquid Take 30 mg by mouth 2 (two) times daily as needed for cough.  Yes Historical Provider, MD  dextromethorphan-guaiFENesin (MUCINEX DM) 30-600 MG per 12 hr tablet Take 2 tablets by mouth 2 (two) times daily.    Yes Historical Provider, MD  dutasteride (AVODART) 0.5 MG capsule Take 0.5 mg by mouth every other day. In the evening.   Yes Historical Provider, MD  esomeprazole (NEXIUM) 20 MG capsule Take 20 mg by mouth daily.   Yes Historical Provider, MD  levalbuterol [facility] HFA) 45 MCG/ACT inhaler Inhale 1-2 puffs into the lungs every 4 (four) hours as needed for wheezing. 04/26/13  Yes Tanda Rockers, MD  loratadine (CLARITIN) 10 MG tablet Take 10 mg by mouth  daily as needed for allergies.   Yes Historical Provider, MD  predniSONE (DELTASONE) 10 MG tablet Take 1 tablet (10 mg total) by mouth daily. 09/27/13  Yes Tanda Rockers, MD  psyllium (HYDROCIL/METAMUCIL) 95 % PACK Take 1 packet by mouth daily.   Yes Historical Provider, MD  Simethicone (GAS-X PO) Take 2 tablets by mouth 2 (two) times daily.    Yes Historical Provider, MD  tamsulosin (FLOMAX) 0.4 MG CAPS capsule Take 0.4 mg by mouth every evening.   Yes Historical Provider, MD  traMADol (ULTRAM) 50 MG tablet 1-2 every 4 hours as needed for cough or pain 09/12/13  Yes Tanda Rockers, MD  amoxicillin-clavulanate (AUGMENTIN) 875-125 MG per tablet Take 1 tablet by mouth 2 (two) times daily. 09/12/13   Historical Provider, MD   Allergies  Allergen Reactions  . Factive [Gemifloxacin]     Ineffective     FAMILY HISTORY:  Family History  Problem Relation Age of Onset  . Asthma Father   . Coronary artery disease Brother    SOCIAL HISTORY:  reports that he has never smoked. He has never used smokeless tobacco. He reports that he does not drink alcohol or use illicit drugs.  REVIEW OF SYSTEMS:  As per HPI. Also severe chronic sinus symptoms  SUBJECTIVE:   VITAL SIGNS: Temp:  [98.1 F (36.7 C)-98.5 F (36.9 C)] 98.5 F (36.9 C) (02/27 1539) Pulse Rate:  [89-186] 109 (02/27 1539) Resp:  [16-20] 18 (02/27 1539) BP: (102-135)/(59-94) 135/70 mmHg (02/27 1539) SpO2:  [93 %-96 %] 95 % (02/27 1539) Weight:  [58.423 kg (128 lb 12.8 oz)] 58.423 kg (128 lb 12.8 oz) (02/27 1539) HEMODYNAMICS:   VENTILATOR SETTINGS:   INTAKE / OUTPUT: Intake/Output   None     PHYSICAL EXAMINATION: General:  Thin, not cachectic, NAD @ rest and on RA Neuro:  No focal deficits HEENT:  WNL. No saddle nose noted Cardiovascular: RRR s M Lungs: coarse exp BS scattered, few bilat rhonchi Abdomen:  Soft, NT, NABS Ext: warm, no edema   LABS: I have reviewed all of today's lab results. Relevant abnormalities are  discussed in the A/P section   CT chest: extensive multiple nodular densities bilaterally - many of which are cavitated. Bilateral bronchiectasis - especially basilar  CT sinuses: Mild to moderate mucosal thickening in the paranasal sinuses without  air-fluid level.  CXR: bilateral nodular densities some of which are cavitating   ASSESSMENT: Wegener's granulomatosis - probable subacute exacerbation Acute infectious bronchiectatic exacerbation Acute (and probably chronic) sinusitis - likely a manifestation of WG Symptom complex including malaise, weakness, anorexia likely attributable to above - doubt attributable to cytoxan  PLAN/REC: All of the following has been discussed with Dr Jerilee Hoh: Continue cyclophosphamide @ 50 mg PO daily Methylpred 80 mg IV q 12 hrs through WE [employer] spectrum abx - Vanc/cefepime  Narrow  as indicated by culture results  Complete 14 days total for exac of bchts Doubt influenza - would not treat empirically unless nasal swab is positive Recheck PA/lat CXR AM 3/2   Merton Border, MD ; [facility] service Mobile [phone].  After 5:30 PM or weekends, call [phone]

## 2013-10-04 NOTE — H&P (Signed)
[facility]          History and Physical    PCP:   [name], MD   Chief Complaint:  Shortness of breath, cough productive of yellow sputum, generalized weakness  HPI: Patient is a pleasant 78 year old white man with a past medical history significant for Wegener's granulomatosis diagnosed in 2011 was on Cytoxan until 2013. He follows with Dr. [name]. He is maintained on prednisone 10 mg daily. He had been feeling well until mid January at which point a chest x-ray showed increased nodules and Dr. [name] decided to restart Cytoxan. Patient came into the hospital with the above-mentioned complaints. His heart rate was noted to be in the 180s in the rhythm of SVT. He converted to sinus rhythm spontaneously. CT scan of the chest shows bronchiectasis as well as numerous cavitary and non-cavitary nodules. We have been asked to admit him for further evaluation and management. In addition to his cough and mild shortness of breath, he endorses low-grade temperatures for about 2 weeks as well as increasing sinus symptoms.  Allergies:   Allergies  Allergen Reactions  . Factive [Gemifloxacin]     Ineffective       Past Medical History  Diagnosis Date  . Allergy   . GERD (gastroesophageal reflux disease)   . Arthritis   . Asthma   . Fungal infection     lungs  . Cataract   . Ulcer   . BPH (benign prostatic hyperplasia)   . Wegener's granulomatosis 2011    sees Dr. [name]   . Peptic stricture of esophagus   . Anemia   . Hiatal hernia   . Adenomatous colon polyp 08/2011  . Bronchiectasis   . On home oxygen therapy 12-18-12    uses 2 l/m nasally at bedtime  . HOH (hard of hearing) 12-18-12    bilaterally  . HBP (high blood pressure) 09/28/2013    Past Surgical History  Procedure Laterality Date  . Hernia repair    . Eye surgery      cataracts removed.   . Colonoscopy  08-16-11    per Dr. Fuller Plan, diverticulosis and polyps, repeat in 5 yrs   .  Esophagogastroduodenoscopy (egd) with esophageal dilation  11-29-10    per Dr. Fuller Plan   . Cataract extraction, bilateral  12-18-12    bilateral  . Cholecystectomy N/A 12/21/2012    Procedure: LAPAROSCOPIC CHOLECYSTECTOMY WITH INTRAOPERATIVE CHOLANGIOGRAM;  Surgeon: Edward Jolly, MD;  Location: [facility];  Service: General;  Laterality: N/A;    Prior to Admission medications   Medication Sig Start Date End Date Taking? Authorizing Provider  aspirin 81 MG tablet Take 81 mg by mouth every evening.    Yes Historical Provider, MD  b complex vitamins tablet Take 1 tablet by mouth daily.     Yes Historical Provider, MD  bisacodyl (DULCOLAX) 5 MG EC tablet Take 5 mg by mouth daily as needed. CONSTIPATION   Yes Historical Provider, MD  budesonide-formoterol (SYMBICORT) 160-4.5 MCG/ACT inhaler Inhale 2 puffs into the lungs 2 (two) times daily.   Yes Historical Provider, MD  ciprofloxacin (CIPRO) 750 MG tablet Take 1 tablet by mouth 2 (two) times daily. 10/03/13  Yes Historical Provider, MD  cyclophosphamide (CYTOXAN) 50 MG tablet Take 1 tablet (50 mg total) by mouth daily. Give on an empty stomach 1 hour before or 2 hours after meals. 08/22/13  Yes Tanda Rockers, MD  dextromethorphan (DELSYM) 30 MG/5ML liquid Take 30 mg by mouth 2 (two)  times daily as needed for cough.   Yes Historical Provider, MD  dextromethorphan-guaiFENesin (MUCINEX DM) 30-600 MG per 12 hr tablet Take 2 tablets by mouth 2 (two) times daily.    Yes Historical Provider, MD  dutasteride (AVODART) 0.5 MG capsule Take 0.5 mg by mouth every other day. In the evening.   Yes Historical Provider, MD  esomeprazole (NEXIUM) 20 MG capsule Take 20 mg by mouth daily.   Yes Historical Provider, MD  levalbuterol [facility] HFA) 45 MCG/ACT inhaler Inhale 1-2 puffs into the lungs every 4 (four) hours as needed for wheezing. 04/26/13  Yes Tanda Rockers, MD  loratadine (CLARITIN) 10 MG tablet Take 10 mg by mouth daily as needed for allergies.   Yes  Historical Provider, MD  predniSONE (DELTASONE) 10 MG tablet Take 1 tablet (10 mg total) by mouth daily. 09/27/13  Yes Tanda Rockers, MD  psyllium (HYDROCIL/METAMUCIL) 95 % PACK Take 1 packet by mouth daily.   Yes Historical Provider, MD  Simethicone (GAS-X PO) Take 2 tablets by mouth 2 (two) times daily.    Yes Historical Provider, MD  tamsulosin (FLOMAX) 0.4 MG CAPS capsule Take 0.4 mg by mouth every evening.   Yes Historical Provider, MD  traMADol (ULTRAM) 50 MG tablet 1-2 every 4 hours as needed for cough or pain 09/12/13  Yes Tanda Rockers, MD  amoxicillin-clavulanate (AUGMENTIN) 875-125 MG per tablet Take 1 tablet by mouth 2 (two) times daily. 09/12/13   Historical Provider, MD    Social History:  reports that he has never smoked. He has never used smokeless tobacco. He reports that he does not drink alcohol or use illicit drugs.  Family History  Problem Relation Age of Onset  . Asthma Father   . Coronary artery disease Brother     Review of Systems:  Constitutional: Denies diaphoresis.  HEENT: Denies photophobia, eye pain, redness, hearing loss, ear pain, congestion, sore throat, rhinorrhea, sneezing, mouth sores, trouble swallowing, neck pain, neck stiffness and tinnitus.   Respiratory: Denies  chest tightness,  and wheezing.   Cardiovascular: Denies chest pain, palpitations and leg swelling.  Gastrointestinal: Denies nausea, vomiting, abdominal pain, diarrhea, constipation, blood in stool and abdominal distention.  Genitourinary: Denies dysuria, urgency, frequency, hematuria, flank pain and difficulty urinating.  Endocrine: Denies: hot or cold intolerance, sweats, changes in hair or nails, polyuria, polydipsia. Musculoskeletal: Denies myalgias, back pain, joint swelling, arthralgias and gait problem.  Skin: Denies pallor, rash and wound.  Neurological: Denies dizziness, seizures, syncope, weakness, light-headedness, numbness and headaches.  Hematological: Denies adenopathy. Easy  bruising, personal or family bleeding history  Psychiatric/Behavioral: Denies suicidal ideation, mood changes, confusion, nervousness, sleep disturbance and agitation   Physical Exam: Blood pressure 135/70, pulse 109, temperature 98.5 F (36.9 C), temperature source Oral, resp. rate 18, height '5\' 6"'  (1.676 m), weight 58.423 kg (128 lb 12.8 oz), SpO2 95.00%. General: Alert, awake, oriented x3, does not appear to be in acute distress, coughing intermittently throughout examination. HEENT: Normocephalic, atraumatic, pupils equal and reactive to light, extraocular movements intact, moist mucous membranes.  Neck: Supple, no JVD, no lymphadenopathy, no bruits, no goiter. Cardiovascular: Regular rate and rhythm, no murmurs, rubs or gallops auscultated. Lungs: Coarse bilateral breath sounds, no wheezes noted. Abdomen: Soft, nontender, nondistended, positive bowel sounds masses likely noted. Extremities: No clubbing, cyanosis or edema, positive pedal pulses. Neurologic: Grossly intact and nonfocal.  Labs on Admission:  Results for orders placed during the hospital encounter of 10/04/13 (from the past 48 hour(s))  CBC  WITH DIFFERENTIAL     Status: Abnormal   Collection Time    10/04/13 11:50 AM      Result Value Ref Range   WBC 9.4  4.0 - 10.5 K/uL   RBC 3.86 (*) 4.22 - 5.81 MIL/uL   Hemoglobin 11.3 (*) 13.0 - 17.0 g/dL   HCT 35.6 (*) 39.0 - 52.0 %   MCV 92.2  78.0 - 100.0 fL   [name] 29.3  26.0 - 34.0 pg   MCHC 31.7  30.0 - 36.0 g/dL   RDW 14.5  11.5 - 15.5 %   Platelets 360  150 - 400 K/uL   Neutrophils Relative % 81 (*) 43 - 77 %   Neutro Abs 7.6  1.7 - 7.7 K/uL   Lymphocytes Relative 8 (*) 12 - 46 %   Lymphs Abs 0.8  0.7 - 4.0 K/uL   Monocytes Relative 10  3 - 12 %   Monocytes Absolute 0.9  0.1 - 1.0 K/uL   Eosinophils Relative 1  0 - 5 %   Eosinophils Absolute 0.1  0.0 - 0.7 K/uL   Basophils Relative 0  0 - 1 %   Basophils Absolute 0.0  0.0 - 0.1 K/uL  COMPREHENSIVE METABOLIC PANEL      Status: Abnormal   Collection Time    10/04/13 11:50 AM      Result Value Ref Range   Sodium 139  137 - 147 mEq/L   Potassium 4.2  3.7 - 5.3 mEq/L   Chloride 102  96 - 112 mEq/L   CO2 21  19 - 32 mEq/L   Glucose, Bld 153 (*) 70 - 99 mg/dL   BUN 25 (*) 6 - 23 mg/dL   Creatinine, Ser 1.18  0.50 - 1.35 mg/dL   Calcium 8.7  8.4 - 10.5 mg/dL   Total Protein 6.2  6.0 - 8.3 g/dL   Albumin 2.6 (*) 3.5 - 5.2 g/dL   AST 28  0 - 37 U/L   ALT 32  0 - 53 U/L   Alkaline Phosphatase 78  39 - 117 U/L   Total Bilirubin 0.6  0.3 - 1.2 mg/dL   GFR calc non Af Amer 59 (*) >90 mL/min   GFR calc Af Amer 68 (*) >90 mL/min   Comment: (NOTE)     The eGFR has been calculated using the CKD EPI equation.     This calculation has not been validated in all clinical situations.     eGFR's persistently <90 mL/min signify possible Chronic Kidney     Disease.  TROPONIN I     Status: None   Collection Time    10/04/13 11:50 AM      Result Value Ref Range   Troponin I <0.30  <0.30 ng/mL   Comment:            Due to the release kinetics of cTnI,     a negative result within the first hours     of the onset of symptoms does not rule out     myocardial infarction with certainty.     If myocardial infarction is still suspected,     repeat the test at appropriate intervals.  D-DIMER, QUANTITATIVE     Status: Abnormal   Collection Time    10/04/13 11:50 AM      Result Value Ref Range   D-Dimer, Quant 3.09 (*) 0.00 - 0.48 ug/mL-FEU   Comment:  AT THE INHOUSE ESTABLISHED CUTOFF     VALUE OF 0.48 ug/mL FEU,     THIS ASSAY HAS BEEN DOCUMENTED     IN THE LITERATURE TO HAVE     A SENSITIVITY AND NEGATIVE     PREDICTIVE VALUE OF AT LEAST     98 TO 99%.  THE TEST RESULT     SHOULD BE CORRELATED WITH     AN ASSESSMENT OF THE CLINICAL     PROBABILITY OF DVT / VTE.  PRO B NATRIURETIC PEPTIDE     Status: None   Collection Time    10/04/13 11:50 AM      Result Value Ref Range   Pro B Natriuretic  peptide (BNP) 387.5  0 - 450 pg/mL  URINALYSIS, ROUTINE W REFLEX MICROSCOPIC     Status: Abnormal   Collection Time    10/04/13  1:42 PM      Result Value Ref Range   Color, Urine YELLOW  YELLOW   APPearance CLEAR  CLEAR   Specific Gravity, Urine 1.017  1.005 - 1.030   pH 6.5  5.0 - 8.0   Glucose, UA NEGATIVE  NEGATIVE mg/dL   Hgb urine dipstick SMALL (*) NEGATIVE   Bilirubin Urine NEGATIVE  NEGATIVE   Ketones, ur NEGATIVE  NEGATIVE mg/dL   Protein, ur NEGATIVE  NEGATIVE mg/dL   Urobilinogen, UA 0.2  0.0 - 1.0 mg/dL   Nitrite NEGATIVE  NEGATIVE   Leukocytes, UA NEGATIVE  NEGATIVE  URINE MICROSCOPIC-ADD ON     Status: None   Collection Time    10/04/13  1:42 PM      Result Value Ref Range   RBC / HPF 0-2  <3 RBC/hpf  TROPONIN I     Status: None   Collection Time    10/04/13  3:15 PM      Result Value Ref Range   Troponin I <0.30  <0.30 ng/mL   Comment:            Due to the release kinetics of cTnI,     a negative result within the first hours     of the onset of symptoms does not rule out     myocardial infarction with certainty.     If myocardial infarction is still suspected,     repeat the test at appropriate intervals.    Radiological Exams on Admission: Dg Chest 2 View  10/04/2013   CLINICAL DATA:  Cough, fever, Wegener granulomatosis  EXAM: CHEST  2 VIEW  COMPARISON:  None.  FINDINGS: Cardiomediastinal silhouette is stable. Bilateral cavitary lung nodules again noted probable due to granulomatosis. Stable chronic reticular interstitial prominence. No definite superimposed infiltrate or pulmonary edema. Bony thorax is stable.  IMPRESSION: No definite infiltrate or pulmonary edema. Bilateral pulmonary nodules some with cavitation probable due to granulomatosis. Stable chronic reticular interstitial prominence.   Electronically Signed   By: Lahoma Crocker M.D.   On: 10/04/2013 13:06   Ct Angio Chest Pe W/cm &/or Wo Cm  10/04/2013   CLINICAL DATA:  Shortness of breath. Cough.  Weakness. Chest pain. History of supraventricular tachycardia.  EXAM: CT ANGIOGRAPHY CHEST WITH CONTRAST  TECHNIQUE: Multidetector CT imaging of the chest was performed using the standard protocol during bolus administration of intravenous contrast. Multiplanar CT image reconstructions and MIPs were obtained to evaluate the vascular anatomy.  CONTRAST:  123m OMNIPAQUE IOHEXOL 350 MG/ML SOLN  COMPARISON:  No priors.  FINDINGS: Mediastinum: There are no filling defects within the  pulmonary arterial tree to suggest underlying pulmonary embolism. Heart size is normal. There is no significant pericardial fluid, thickening or pericardial calcification. There is atherosclerosis of the thoracic aorta, the great vessels of the mediastinum and the coronary arteries, including calcified atherosclerotic plaque in the left main, left anterior descending, left circumflex and right coronary arteries. Numerous prominent borderline enlarged mediastinal and hilar lymph nodes are noted, presumably reactive. No definite pathologic nodal enlargement. Large hiatal hernia.  Lungs/Pleura: Diffuse bronchial wall thickening with patchy areas of more severe thickening of the bronchial walls and the peribronchovascular interstitium. Extensive multifocal cylindrical, varicose and cystic bronchiectasis in a patchy distribution throughout the lungs bilaterally. Many of the bronchiectatic regions result in what appear to be thick-walled cavitary nodules, however, the majority of these are actually dilated bronchi. There are multiple additional nodules scattered throughout the lungs bilaterally, largest of which on the left side, including a solid-appearing 1.5 x 1.2 cm left upper lobe nodule (image 43 of series 7), as well as a pleural-based left lower lobe nodule which is also solid in appearance measuring 2.7 x 2.4 cm. Multiple smaller solid and cavitary appearing nodules are also noted. No pleural effusions.  Upper Abdomen: Unremarkable.   Musculoskeletal: There are no aggressive appearing lytic or blastic lesions noted in the visualized portions of the skeleton.  Review of the MIP images confirms the above findings.  IMPRESSION: 1. No evidence of pulmonary embolism. 2. Spectrum of findings in the lungs, as detailed above, presumably a manifestation of the patient's Wegener's granulomatosis, including severe multifocal bronchiectasis as well as numerous cavitary and non-cavitary nodules. As there is potential for malignant disease involving any one of these nodules (not favored at this time), a short-term follow-up chest CT in 3 months is suggested to confirm the stability or resolution of the largest of these lesions in the left upper and left lower lobes, as discussed above. 3. Atherosclerosis, including left main and 3 vessel coronary artery disease. Please note that although the presence of coronary artery calcium documents the presence of coronary artery disease, the severity of this disease and any potential stenosis cannot be assessed on this non-gated CT examination. Assessment for potential risk factor modification, dietary therapy or pharmacologic therapy may be warranted, if clinically indicated. 4. Large hiatal hernia.   Electronically Signed   By: Vinnie Langton M.D.   On: 10/04/2013 13:38   [place] Cm  10/04/2013   CLINICAL DATA:  Chronic rhinitis  EXAM: CT PARANASAL SINUS WITHOUT CONTRAST  TECHNIQUE: Multidetector CT images of the paranasal sinuses were obtained using the standard protocol without intravenous contrast.  COMPARISON:  CT head 04/11/2010  FINDINGS: Mild to moderate mucosal thickening in the paranasal sinuses diffusely. Left frontal sinus is clear. Right frontal sinus is hypoplastic.  Mastoid sinus is clear bilaterally.  No air-fluid level.  No acute bony abnormality.  IMPRESSION: Mild to moderate mucosal thickening in the paranasal sinuses without air-fluid level.   Electronically Signed   By: Franchot Gallo M.D.   On: 10/04/2013 13:43    Assessment/Plan Principal Problem:   SOB (shortness of breath) Active Problems:   Wegener's granulomatosis   Bronchiectasis with acute exacerbation   SVT (supraventricular tachycardia)   Cough   Weakness generalized   Wegener's granulomatosis/bronchiectasis with acute exacerbation -Difficult to discern whether symptoms are related to acute infection versus a flare up from his Wegener's granulomatosis. -Discussed with Dr. Leonidas Romberg, pulmonary at bedside. -Plan to start IV steroids, continue Cytoxan, start cefepime for  gram-negative coverage as well as vancomycin for? MRSA. -Repeat chest x-ray PA and lateral 2 days.  SVT -Spontaneously converted to sinus rhythm in the emergency department. -Continue telemetry for the next 24-48 hours.  DVT prophylaxis -Lovenox.  CODE STATUS Full code.   Time Spent on Admission: 75 minutes  HERNANDEZ ACOSTA,ESTELA [facility] Pager: [phone] 10/04/2013, 4:50 PM

## 2013-10-04 NOTE — Telephone Encounter (Signed)
Pt wife called taking husband Orby Flessner to hospital please call her @ [phone]

## 2013-10-04 NOTE — Telephone Encounter (Signed)
I called and spoke with pt. He reports he feels terrible and is having chest discomfort and struggling to breathe. I advised will make MW aware.

## 2013-10-04 NOTE — ED Notes (Signed)
Pt ambulated 30 ft in hall. Pt O2 sat on RA 94-96%.

## 2013-10-04 NOTE — ED Provider Notes (Signed)
CSN: [id number]     Arrival date & time 10/04/13  1124 History   First MD Initiated Contact with Patient 10/04/13 1130     Chief Complaint  Patient presents with  . Shortness of Breath     (Consider location/radiation/quality/duration/timing/severity/associated sxs/prior Treatment) HPI Comments: Patient arrives to the ER with one week history of progressively worsening shortness of breath, generalized weakness, decreased appetite. Denies any chest pain or fever. History of heart rate is 186 and has a narrow complex tachycardia on the monitor consistent with SVT. During the course of my evaluation he broke to sinus tachycardia in the 1 teens and 120s. Denies any palpitations. Denies any previous history of SVT. His wife reports poor by mouth intake over the past week. He has a history of bronchiectasis and is recently restarted on Cytoxan by Dr. [name]. He denies any leg pain or leg swelling. Denies any history of blood clots.  The history is provided by the patient and the spouse. The history is limited by the condition of the patient.    Past Medical History  Diagnosis Date  . Allergy   . GERD (gastroesophageal reflux disease)   . Arthritis   . Asthma   . Fungal infection     lungs  . Cataract   . Ulcer   . BPH (benign prostatic hyperplasia)   . Wegener's granulomatosis 2011    sees Dr. [name]   . Peptic stricture of esophagus   . Anemia   . Hiatal hernia   . Adenomatous colon polyp 08/2011  . Bronchiectasis   . On home oxygen therapy 12-18-12    uses 2 l/m nasally at bedtime  . HOH (hard of hearing) 12-18-12    bilaterally  . HBP (high blood pressure) 09/28/2013   Past Surgical History  Procedure Laterality Date  . Hernia repair    . Eye surgery      cataracts removed.   . Colonoscopy  08-16-11    per Dr. Fuller Plan, diverticulosis and polyps, repeat in 5 yrs   . Esophagogastroduodenoscopy (egd) with esophageal dilation  11-29-10    per Dr. Fuller Plan   . Cataract extraction, bilateral   12-18-12    bilateral  . Cholecystectomy N/A 12/21/2012    Procedure: LAPAROSCOPIC CHOLECYSTECTOMY WITH INTRAOPERATIVE CHOLANGIOGRAM;  Surgeon: Edward Jolly, MD;  Location: [facility];  Service: General;  Laterality: N/A;   Family History  Problem Relation Age of Onset  . Asthma Father   . Coronary artery disease Brother    History  Substance Use Topics  . Smoking status: Never Smoker   . Smokeless tobacco: Never Used  . Alcohol Use: No    Review of Systems  Constitutional: Positive for activity change, appetite change and fatigue. Negative for fever.  HENT: Negative for congestion and rhinorrhea.   Respiratory: Positive for cough and shortness of breath. Negative for chest tightness.   Cardiovascular: Negative for chest pain.  Gastrointestinal: Negative for nausea, vomiting and abdominal pain.  Genitourinary: Negative for dysuria and hematuria.  Musculoskeletal: Negative for arthralgias and myalgias.  Skin: Negative for rash.  Neurological: Positive for weakness. Negative for dizziness and headaches.  A complete 10 system review of systems was obtained and all systems are negative except as noted in the HPI and PMH.      Allergies  Factive  Home Medications   No current outpatient prescriptions on file. BP 135/70  Pulse 109  Temp(Src) 98.5 F (36.9 C) (Oral)  Resp 18  Ht 5\' 6"  (  1.676 m)  Wt 128 lb 12.8 oz (58.423 kg)  BMI 20.80 kg/m2  SpO2 95% Physical Exam  Constitutional: He is oriented to person, place, and time. He appears well-developed and well-nourished. No distress.  HENT:  Head: Normocephalic and atraumatic.  Mouth/Throat: Oropharynx is clear and moist. No oropharyngeal exudate.  Eyes: Conjunctivae and EOM are normal. Pupils are equal, round, and reactive to light.  Neck: Normal range of motion. Neck supple.  Cardiovascular: Normal rate and normal heart sounds.   No murmur heard. tachycardic  Pulmonary/Chest: Effort normal. No respiratory distress.   Abdominal: Soft. Bowel sounds are normal.  Musculoskeletal: Normal range of motion. He exhibits no edema and no tenderness.  Neurological: He is alert and oriented to person, place, and time. No cranial nerve deficit. He exhibits normal muscle tone. Coordination normal.  Skin: Skin is warm.    ED Course  Procedures (including critical care time) Labs Review Labs Reviewed  CBC WITH DIFFERENTIAL - Abnormal; Notable for the following:    RBC 3.86 (*)    Hemoglobin 11.3 (*)    HCT 35.6 (*)    Neutrophils Relative % 81 (*)    Lymphocytes Relative 8 (*)    All other components within normal limits  COMPREHENSIVE METABOLIC PANEL - Abnormal; Notable for the following:    Glucose, Bld 153 (*)    BUN 25 (*)    Albumin 2.6 (*)    GFR calc non Af Amer 59 (*)    GFR calc Af Amer 68 (*)    All other components within normal limits  D-DIMER, QUANTITATIVE - Abnormal; Notable for the following:    D-Dimer, Quant 3.09 (*)    All other components within normal limits  URINALYSIS, ROUTINE W REFLEX MICROSCOPIC - Abnormal; Notable for the following:    Hgb urine dipstick SMALL (*)    All other components within normal limits  CBC - Abnormal; Notable for the following:    RBC 3.51 (*)    Hemoglobin 10.3 (*)    HCT 32.5 (*)    All other components within normal limits  CREATININE, SERUM - Abnormal; Notable for the following:    GFR calc non Af Amer 59 (*)    GFR calc Af Amer 69 (*)    All other components within normal limits  TROPONIN I  PRO B NATRIURETIC PEPTIDE  TROPONIN I  URINE MICROSCOPIC-ADD ON  INFLUENZA PANEL BY PCR (TYPE A & B, 99991111)  BASIC METABOLIC PANEL  CBC   Imaging Review Dg Chest 2 View  10/04/2013   CLINICAL DATA:  Cough, fever, Wegener granulomatosis  EXAM: CHEST  2 VIEW  COMPARISON:  None.  FINDINGS: Cardiomediastinal silhouette is stable. Bilateral cavitary lung nodules again noted probable due to granulomatosis. Stable chronic reticular interstitial prominence. No  definite superimposed infiltrate or pulmonary edema. Bony thorax is stable.  IMPRESSION: No definite infiltrate or pulmonary edema. Bilateral pulmonary nodules some with cavitation probable due to granulomatosis. Stable chronic reticular interstitial prominence.   Electronically Signed   By: Lahoma Crocker M.D.   On: 10/04/2013 13:06   Ct Angio Chest Pe W/cm &/or Wo Cm  10/04/2013   CLINICAL DATA:  Shortness of breath. Cough. Weakness. Chest pain. History of supraventricular tachycardia.  EXAM: CT ANGIOGRAPHY CHEST WITH CONTRAST  TECHNIQUE: Multidetector CT imaging of the chest was performed using the standard protocol during bolus administration of intravenous contrast. Multiplanar CT image reconstructions and MIPs were obtained to evaluate the vascular anatomy.  CONTRAST:  113mL  OMNIPAQUE IOHEXOL 350 MG/ML SOLN  COMPARISON:  No priors.  FINDINGS: Mediastinum: There are no filling defects within the pulmonary arterial tree to suggest underlying pulmonary embolism. Heart size is normal. There is no significant pericardial fluid, thickening or pericardial calcification. There is atherosclerosis of the thoracic aorta, the great vessels of the mediastinum and the coronary arteries, including calcified atherosclerotic plaque in the left main, left anterior descending, left circumflex and right coronary arteries. Numerous prominent borderline enlarged mediastinal and hilar lymph nodes are noted, presumably reactive. No definite pathologic nodal enlargement. Large hiatal hernia.  Lungs/Pleura: Diffuse bronchial wall thickening with patchy areas of more severe thickening of the bronchial walls and the peribronchovascular interstitium. Extensive multifocal cylindrical, varicose and cystic bronchiectasis in a patchy distribution throughout the lungs bilaterally. Many of the bronchiectatic regions result in what appear to be thick-walled cavitary nodules, however, the majority of these are actually dilated bronchi. There are  multiple additional nodules scattered throughout the lungs bilaterally, largest of which on the left side, including a solid-appearing 1.5 x 1.2 cm left upper lobe nodule (image 43 of series 7), as well as a pleural-based left lower lobe nodule which is also solid in appearance measuring 2.7 x 2.4 cm. Multiple smaller solid and cavitary appearing nodules are also noted. No pleural effusions.  Upper Abdomen: Unremarkable.  Musculoskeletal: There are no aggressive appearing lytic or blastic lesions noted in the visualized portions of the skeleton.  Review of the MIP images confirms the above findings.  IMPRESSION: 1. No evidence of pulmonary embolism. 2. Spectrum of findings in the lungs, as detailed above, presumably a manifestation of the patient's Wegener's granulomatosis, including severe multifocal bronchiectasis as well as numerous cavitary and non-cavitary nodules. As there is potential for malignant disease involving any one of these nodules (not favored at this time), a short-term follow-up chest CT in 3 months is suggested to confirm the stability or resolution of the largest of these lesions in the left upper and left lower lobes, as discussed above. 3. Atherosclerosis, including left main and 3 vessel coronary artery disease. Please note that although the presence of coronary artery calcium documents the presence of coronary artery disease, the severity of this disease and any potential stenosis cannot be assessed on this non-gated CT examination. Assessment for potential risk factor modification, dietary therapy or pharmacologic therapy may be warranted, if clinically indicated. 4. Large hiatal hernia.   Electronically Signed   By: Vinnie Langton M.D.   On: 10/04/2013 13:38   [place] Cm  10/04/2013   CLINICAL DATA:  Chronic rhinitis  EXAM: CT PARANASAL SINUS WITHOUT CONTRAST  TECHNIQUE: Multidetector CT images of the paranasal sinuses were obtained using the standard protocol without  intravenous contrast.  COMPARISON:  CT head 04/11/2010  FINDINGS: Mild to moderate mucosal thickening in the paranasal sinuses diffusely. Left frontal sinus is clear. Right frontal sinus is hypoplastic.  Mastoid sinus is clear bilaterally.  No air-fluid level.  No acute bony abnormality.  IMPRESSION: Mild to moderate mucosal thickening in the paranasal sinuses without air-fluid level.   Electronically Signed   By: Franchot Gallo M.D.   On: 10/04/2013 13:43    EKG Interpretation  Date/Time:    Ventricular Rate:    PR Interval:    QRS Duration:   QT Interval:    QTC Calculation:   R Axis:     Text Interpretation:          MDM   Final diagnoses:  SVT (supraventricular  tachycardia)  Dyspnea  progressively worsening breathing over the past one week with decreased appetite no chest pain or fever. SVT on arrival which converted spontaneously to sinus tachycardia.  CXR shows stable nodules No wheezing on exam. In setting of tachycardia and SOB, CTPE obtained.  NO PE. HR improved to 100s with IVF.  Ambulatory maintaining saturations >95%.  D/w TRH and PCCM and patient will be admitted for IV steroids and antibiotics for likely WG exacerbation.  BP 135/70  Pulse 109  Temp(Src) 98.5 F (36.9 C) (Oral)  Resp 18  Ht 5\' 6"  (1.676 m)  Wt 128 lb 12.8 oz (58.423 kg)  BMI 20.80 kg/m2  SpO2 95%    Date: 10/04/2013  Rate: 116  Rhythm: sinus tachycardia  QRS Axis: normal  Intervals: normal  ST/T Wave abnormalities: normal  Conduction Disutrbances:none  Narrative Interpretation: PVcs  Old EKG Reviewed: unchanged      Ezequiel Essex, MD 10/04/13 1929

## 2013-10-04 NOTE — Progress Notes (Signed)
ANTIBIOTIC CONSULT NOTE - INITIAL  Pharmacy Consult for Vancomycin Indication: bronchiectasis and sinusitis  Allergies  Allergen Reactions  . Factive [Gemifloxacin]     Ineffective     Patient Measurements: Height: 5\' 6"  (167.6 cm) Weight: 128 lb 12.8 oz (58.423 kg) IBW/kg (Calculated) : 63.8  Vital Signs: Temp: 98.5 F (36.9 C) (02/27 1539) Temp src: Oral (02/27 1539) BP: 135/70 mmHg (02/27 1539) Pulse Rate: 109 (02/27 1539)  Labs:  Recent Labs  10/04/13 1150  WBC 9.4  HGB 11.3*  PLT 360  CREATININE 1.18   Estimated Creatinine Clearance: 44.7 ml/min (by C-G formula based on Cr of 1.18).  Microbiology: No results found for this or any previous visit (from the past 720 hour(s)).  Medical History: Past Medical History  Diagnosis Date  . Allergy   . GERD (gastroesophageal reflux disease)   . Arthritis   . Asthma   . Fungal infection     lungs  . Cataract   . Ulcer   . BPH (benign prostatic hyperplasia)   . Wegener's granulomatosis 2011    sees Dr. [name]   . Peptic stricture of esophagus   . Anemia   . Hiatal hernia   . Adenomatous colon polyp 08/2011  . Bronchiectasis   . On home oxygen therapy 12-18-12    uses 2 l/m nasally at bedtime  . HOH (hard of hearing) 12-18-12    bilaterally  . HBP (high blood pressure) 09/28/2013    Medications:  Anti-infectives   Start     Dose/Rate Route Frequency Ordered Stop   10/05/13 0600  vancomycin (VANCOCIN) 500 mg in sodium chloride 0.9 % 100 mL IVPB     500 mg 100 mL/hr over 60 Minutes Intravenous Every 12 hours 10/04/13 1715     10/04/13 2200  ceFEPIme (MAXIPIME) 1 g in dextrose 5 % 50 mL IVPB     1 g 100 mL/hr over 30 Minutes Intravenous Every 12 hours 10/04/13 1650     10/04/13 1730  vancomycin (VANCOCIN) IVPB 1000 mg/200 mL premix     1,000 mg 200 mL/hr over 60 Minutes Intravenous  Once 10/04/13 1715       Assessment: 77 yoM admitted 2/27 with SOB, productive cough, and weakness.  PMH includes Wegener's  granulomatosis (dx 2011) on Cytoxan and prednisone.  Suspected of having subacute Wegener's exacerbation, acute infectious bronchiectasis, and sinusitis.  Cefepime dosing per MD.  Pharmacy is consulted to dose IV vancomycin.  Tmax: 98.5  WBCs: 8.8  Renal: SCr 1.17, CrCl ~ 45 ml/min  Goal of Therapy:  Vancomycin trough level 15-20 mcg/ml  Plan:   Vancomycin 1g IV x1 dose then 500mg  IV q12h.  Measure Vanc trough at steady state.  Follow up renal fxn and culture results.  Gretta Arab PharmD, BCPS Pager [phone] 10/04/2013 6:33 PM

## 2013-10-05 LAB — CBC
HEMATOCRIT: 31.9 % — AB (ref 39.0–52.0)
Hemoglobin: 10.3 g/dL — ABNORMAL LOW (ref 13.0–17.0)
MCH: 29.6 pg (ref 26.0–34.0)
MCHC: 32.3 g/dL (ref 30.0–36.0)
MCV: 91.7 fL (ref 78.0–100.0)
Platelets: 369 10*3/uL (ref 150–400)
RBC: 3.48 MIL/uL — ABNORMAL LOW (ref 4.22–5.81)
RDW: 14.4 % (ref 11.5–15.5)
WBC: 7 10*3/uL (ref 4.0–10.5)

## 2013-10-05 LAB — INFLUENZA PANEL BY PCR (TYPE A & B)
H1N1FLUPCR: NOT DETECTED
INFLAPCR: NEGATIVE
Influenza B By PCR: NEGATIVE

## 2013-10-05 LAB — BASIC METABOLIC PANEL
BUN: 21 mg/dL (ref 6–23)
CO2: 23 meq/L (ref 19–32)
Calcium: 8.4 mg/dL (ref 8.4–10.5)
Chloride: 105 mEq/L (ref 96–112)
Creatinine, Ser: 1.06 mg/dL (ref 0.50–1.35)
GFR calc non Af Amer: 67 mL/min — ABNORMAL LOW (ref >90)
GFR, EST AFRICAN AMERICAN: 77 mL/min — AB (ref >90)
Glucose, Bld: 135 mg/dL — ABNORMAL HIGH (ref 70–99)
POTASSIUM: 4.7 meq/L (ref 3.7–5.3)
Sodium: 139 mEq/L (ref 137–147)

## 2013-10-05 MED ORDER — HYDROCOD POLST-CHLORPHEN POLST 10-8 MG/5ML PO LQCR
5.0000 mL | Freq: Every evening | ORAL | Status: DC | PRN
  Administered 2013-10-05 – 2013-10-06 (×2): 5 mL via ORAL
  Filled 2013-10-05 (×2): qty 5

## 2013-10-05 MED ORDER — CYCLOPHOSPHAMIDE 50 MG PO TABS
50.0000 mg | ORAL_TABLET | Freq: Every day | ORAL | Status: DC
  Administered 2013-10-05 – 2013-10-07 (×3): 50 mg via ORAL
  Filled 2013-10-05 (×3): qty 1

## 2013-10-05 MED ORDER — GUAIFENESIN 100 MG/5ML PO SYRP
200.0000 mg | ORAL_SOLUTION | ORAL | Status: DC | PRN
  Administered 2013-10-05 – 2013-10-07 (×3): 200 mg via ORAL
  Filled 2013-10-05 (×5): qty 10

## 2013-10-05 MED ORDER — SULFAMETHOXAZOLE-TMP DS 800-160 MG PO TABS
1.0000 | ORAL_TABLET | Freq: Every day | ORAL | Status: DC
  Administered 2013-10-05 – 2013-10-07 (×3): 1 via ORAL
  Filled 2013-10-05 (×3): qty 1

## 2013-10-05 NOTE — Progress Notes (Signed)
[facility] PROGRESS NOTE  ROBEY DEBOY [id number] DOB: 1936-02-09 DOA: 10/04/2013 PCP: [name], MD  Assessment/Plan: Wegener's Granulomatosis/Bronchiectasis with Acute Exacerbation -Continue vanc/cefepime and narrow according to cx data. -Continue IV steroids/cytoxan. -Patient feels like his sinus issues have completely resolved, altho cough remains an issue today. -Repeat CXR on [place]. -Appreciate pulmonary input.  SVT -Spontaneously converted to NSR.  Code Status: Full Code Family Communication: Patient only  Disposition Plan: Home when ready   Consultants:  Pulmonary   Antibiotics:  Vanc  Cefepime   Subjective: Still with bothersome cough.  Objective: Filed Vitals:   10/04/13 2002 10/04/13 2200 10/05/13 0611 10/05/13 0934  BP:  126/87 133/71   Pulse:  99 90   Temp:  98.2 F (36.8 C) 97.7 F (36.5 C)   TempSrc:  Oral Oral   Resp:  20 20   Height:      Weight:      SpO2: 92% 96% 96% 91%    Intake/Output Summary (Last 24 hours) at 10/05/13 1329 Last data filed at 10/05/13 0930  Gross per 24 hour  Intake   1160 ml  Output   1140 ml  Net     20 ml   Filed Weights   10/04/13 1539  Weight: 58.423 kg (128 lb 12.8 oz)    Exam:   General:  AA Ox3  Cardiovascular: RRR  Respiratory: CTA B  Abdomen: S/NT/ND/+BS  Extremities: no C/C/E   Neurologic:  Intact and non-focal.  Data Reviewed: Basic Metabolic Panel:  Recent Labs Lab 10/04/13 1150 10/04/13 1728 10/05/13 0545  NA 139  --  139  K 4.2  --  4.7  CL 102  --  105  CO2 21  --  23  GLUCOSE 153*  --  135*  BUN 25*  --  21  CREATININE 1.18 1.17 1.06  CALCIUM 8.7  --  8.4   Liver Function Tests:  Recent Labs Lab 10/04/13 1150  AST 28  ALT 32  ALKPHOS 78  BILITOT 0.6  PROT 6.2  ALBUMIN 2.6*   No results found for this basename: LIPASE, AMYLASE,  in the last 168 hours No results found for this basename: AMMONIA,  in the last 168 hours CBC:  Recent  Labs Lab 10/04/13 1150 10/04/13 1728 10/05/13 0545  WBC 9.4 8.8 7.0  NEUTROABS 7.6  --   --   HGB 11.3* 10.3* 10.3*  HCT 35.6* 32.5* 31.9*  MCV 92.2 92.6 91.7  PLT 360 363 369   Cardiac Enzymes:  Recent Labs Lab 10/04/13 1150 10/04/13 1515  TROPONINI <0.30 <0.30   BNP (last 3 results)  Recent Labs  10/04/13 1150  PROBNP 387.5   CBG: No results found for this basename: GLUCAP,  in the last 168 hours  No results found for this or any previous visit (from the past 240 hour(s)).   Studies: Dg Chest 2 View  10/04/2013   CLINICAL DATA:  Cough, fever, Wegener granulomatosis  EXAM: CHEST  2 VIEW  COMPARISON:  None.  FINDINGS: Cardiomediastinal silhouette is stable. Bilateral cavitary lung nodules again noted probable due to granulomatosis. Stable chronic reticular interstitial prominence. No definite superimposed infiltrate or pulmonary edema. Bony thorax is stable.  IMPRESSION: No definite infiltrate or pulmonary edema. Bilateral pulmonary nodules some with cavitation probable due to granulomatosis. Stable chronic reticular interstitial prominence.   Electronically Signed   By: Lahoma Crocker M.D.   On: 10/04/2013 13:06   Ct Angio Chest Pe W/cm &/or Wo Cm  10/04/2013   CLINICAL DATA:  Shortness of breath. Cough. Weakness. Chest pain. History of supraventricular tachycardia.  EXAM: CT ANGIOGRAPHY CHEST WITH CONTRAST  TECHNIQUE: Multidetector CT imaging of the chest was performed using the standard protocol during bolus administration of intravenous contrast. Multiplanar CT image reconstructions and MIPs were obtained to evaluate the vascular anatomy.  CONTRAST:  172mL OMNIPAQUE IOHEXOL 350 MG/ML SOLN  COMPARISON:  No priors.  FINDINGS: Mediastinum: There are no filling defects within the pulmonary arterial tree to suggest underlying pulmonary embolism. Heart size is normal. There is no significant pericardial fluid, thickening or pericardial calcification. There is atherosclerosis of the  thoracic aorta, the great vessels of the mediastinum and the coronary arteries, including calcified atherosclerotic plaque in the left main, left anterior descending, left circumflex and right coronary arteries. Numerous prominent borderline enlarged mediastinal and hilar lymph nodes are noted, presumably reactive. No definite pathologic nodal enlargement. Large hiatal hernia.  Lungs/Pleura: Diffuse bronchial wall thickening with patchy areas of more severe thickening of the bronchial walls and the peribronchovascular interstitium. Extensive multifocal cylindrical, varicose and cystic bronchiectasis in a patchy distribution throughout the lungs bilaterally. Many of the bronchiectatic regions result in what appear to be thick-walled cavitary nodules, however, the majority of these are actually dilated bronchi. There are multiple additional nodules scattered throughout the lungs bilaterally, largest of which on the left side, including a solid-appearing 1.5 x 1.2 cm left upper lobe nodule (image 43 of series 7), as well as a pleural-based left lower lobe nodule which is also solid in appearance measuring 2.7 x 2.4 cm. Multiple smaller solid and cavitary appearing nodules are also noted. No pleural effusions.  Upper Abdomen: Unremarkable.  Musculoskeletal: There are no aggressive appearing lytic or blastic lesions noted in the visualized portions of the skeleton.  Review of the MIP images confirms the above findings.  IMPRESSION: 1. No evidence of pulmonary embolism. 2. Spectrum of findings in the lungs, as detailed above, presumably a manifestation of the patient's Wegener's granulomatosis, including severe multifocal bronchiectasis as well as numerous cavitary and non-cavitary nodules. As there is potential for malignant disease involving any one of these nodules (not favored at this time), a short-term follow-up chest CT in 3 months is suggested to confirm the stability or resolution of the largest of these lesions  in the left upper and left lower lobes, as discussed above. 3. Atherosclerosis, including left main and 3 vessel coronary artery disease. Please note that although the presence of coronary artery calcium documents the presence of coronary artery disease, the severity of this disease and any potential stenosis cannot be assessed on this non-gated CT examination. Assessment for potential risk factor modification, dietary therapy or pharmacologic therapy may be warranted, if clinically indicated. 4. Large hiatal hernia.   Electronically Signed   By: Vinnie Langton M.D.   On: 10/04/2013 13:38   [place] Cm  10/04/2013   CLINICAL DATA:  Chronic rhinitis  EXAM: CT PARANASAL SINUS WITHOUT CONTRAST  TECHNIQUE: Multidetector CT images of the paranasal sinuses were obtained using the standard protocol without intravenous contrast.  COMPARISON:  CT head 04/11/2010  FINDINGS: Mild to moderate mucosal thickening in the paranasal sinuses diffusely. Left frontal sinus is clear. Right frontal sinus is hypoplastic.  Mastoid sinus is clear bilaterally.  No air-fluid level.  No acute bony abnormality.  IMPRESSION: Mild to moderate mucosal thickening in the paranasal sinuses without air-fluid level.   Electronically Signed   By: Franchot Gallo M.D.  On: 10/04/2013 13:43    Scheduled Meds: . aspirin  81 mg Oral QPM  . budesonide-formoterol  2 puff Inhalation BID  . ceFEPime (MAXIPIME) IV  1 g Intravenous Q12H  . dextromethorphan-guaiFENesin  2 tablet Oral BID  . dutasteride  0.5 mg Oral QODAY  . enoxaparin (LOVENOX) injection  40 mg Subcutaneous Q24H  . methylPREDNISolone (SOLU-MEDROL) injection  80 mg Intravenous Q12H  . pantoprazole  40 mg Oral Daily  . psyllium  1 packet Oral Daily  . sodium chloride  3 mL Intravenous Q12H  . sodium chloride  3 mL Intravenous Q12H  . tamsulosin  0.4 mg Oral QPM  . vancomycin  500 mg Intravenous Q12H   Continuous Infusions:   Principal Problem:   SOB  (shortness of breath) Active Problems:   Wegener's granulomatosis   Bronchiectasis with acute exacerbation   SVT (supraventricular tachycardia)   Cough   Weakness generalized   Wegener's disease, pulmonary    Time spent: 25 minutes. Greater than 50% of this time was spent in direct contact with the patient coordinating care.    Lelon Frohlich  [facility] Pager [phone]  If 7PM-7AM, please contact night-coverage at [email or web address], password [facility] 10/05/2013, 1:29 PM  LOS: 1 day

## 2013-10-05 NOTE — Progress Notes (Signed)
Name: Richard [name]  MRN: [id number]  DOB: Mar 06, 1936  ADMISSION DATE: 10/04/2013  CONSULTATION DATE: 2/27  REFERRING MD : Jerilee Hoh  PRIMARY SERVICE: [facility]  BRIEF PATIENT DESCRIPTION:  53 M followed by MW in [facility] pulm office with Wegener's granulomatosis adm 2/27 by [facility] with relapsing symptoms of several wks duration    HISTORY OF PRESENT ILLNESS:  Dr Gustavus Bryant recent progress note from 2/20 documents in detail this gentleman's hx of WG and has been reviewed in detail. He was initially diagnosed 03/2010. He was treated with cyclophosphamide and prednisone. He went into remission and cyclophosphamide was discontinued in early 2013. He was seen in office in mid January of this year with development of weakness, anorexia, intermittent fever and malaise. He was started back on cyclophosphamide 50 mg daily. His symptoms have progressed and he attributes worsening weakness, cough, anorexia and SOB to the cyclophosphamide. He presented to [facility] ED with same symptoms of weakness, malaise, cough productive of yellow sputum, SOB, anorexia all of several weeks duration. He denies hemoptysis, CP, N/V/D, abd pain, dysuria. He had an episode of PSVT while in ED. Admitted to [facility] service. PCCM asked to address therapy of WG and resp symptoms  SUBJECTIVE:  VITAL SIGNS:  Temp: [98.1 F (36.7 C)-98.5 F (36.9 C)] 98.5 F (36.9 C) (02/27 1539)  Pulse Rate: [89-186] 109 (02/27 1539)  Resp: [16-20] 18 (02/27 1539)  BP: (102-135)/(59-94) 135/70 mmHg (02/27 1539)  SpO2: [93 %-96 %] 95 % (02/27 1539)  Weight: [58.423 kg (128 lb 12.8 oz)] 58.423 kg (128 lb 12.8 oz) (02/27 1539)   INTAKE / OUTPUT:  Intake/Output  None   PHYSICAL EXAMINATION:  General: Thin, not cachectic, NAD @ rest and on RA  Neuro: No focal deficits  HEENT: WNL. No saddle nose noted  Cardiovascular: RRR s M  Lungs: coarse exp BS scattered, few bilat rhonchi  Abdomen: Soft, NT, NABS  Ext: warm, no edema  ASSESSMENT:  Wegener's granulomatosis  - probable subacute exacerbation  Acute infectious bronchiectatic exacerbation  Acute (and probably chronic) sinusitis - likely a manifestation of WG  Recommendations:  - continue cytoxan and steroids -needs to be on PCP prophylaxis with bactrim daily while on cytoxan -followup chest xray as scheduled.  Will see again on Monday.  Please call if issues arise this weekend.

## 2013-10-06 NOTE — Progress Notes (Signed)
Nutrition Brief Note  Patient identified on the Malnutrition Screening Tool (MST) Report  Wt Readings from Last 15 Encounters:  10/04/13 128 lb 12.8 oz (58.423 kg)  09/27/13 132 lb (59.875 kg)  09/12/13 132 lb 12.8 oz (60.238 kg)  09/06/13 133 lb 12.8 oz (60.691 kg)  08/19/13 136 lb 3.2 oz (61.78 kg)  07/26/13 136 lb (61.689 kg)  04/26/13 131 lb 9.6 oz (59.693 kg)  02/19/13 132 lb 3.2 oz (59.966 kg)  12/28/12 129 lb 6.4 oz (58.695 kg)  12/18/12 129 lb 8 oz (58.741 kg)  12/07/12 130 lb 12.8 oz (59.33 kg)  12/06/12 131 lb 3.2 oz (59.512 kg)  11/26/12 135 lb (61.236 kg)  11/22/12 134 lb (60.782 kg)  10/11/12 138 lb 12.8 oz (62.959 kg)   Patient reports that he has had a decrease in appetite since starting cytoxan medication for recurrence of Wegener's granulomatosis in mid January. He has lost about 4 pounds since then (3% of his UBW), which is not significant for malnutrition. Currently, appetite is good with about 85-100% intake of meals.    Body mass index is 20.8 kg/(m^2). Patient meets criteria for normal weight based on current BMI.   Current diet order is Regular, patient is consuming approximately 85-100% of meals at this time. Labs and medications reviewed.   No nutrition interventions warranted at this time. If nutrition issues arise, please consult RD.   Larey Seat, RD, LDN Pager #: [phone] After-Hours Pager #: [phone]

## 2013-10-06 NOTE — Progress Notes (Signed)
[facility] PROGRESS NOTE  Richard [name] DPO:[id number] DOB: 1936-06-15 DOA: 10/04/2013 PCP: [name], MD  Assessment/Plan: Wegener's Granulomatosis/Bronchiectasis with Acute Exacerbation -Continue vanc/cefepime and narrow according to cx data. -Continue IV steroids/cytoxan. -Patient feels like his sinus issues have completely resolved, altho cough remains an issue today. -Repeat CXR on Monday. -Appreciate pulmonary input.  SVT -Spontaneously converted to NSR.  Code Status: Full Code Family Communication: Patient only  Disposition Plan: Home when ready; likely 24-48 hours.   Consultants:  Pulmonary   Antibiotics:  Vanc  Cefepime   Subjective: Still with bothersome cough.  Objective: Filed Vitals:   10/05/13 2050 10/05/13 2145 10/06/13 0420 10/06/13 1331  BP:  125/70 130/80 110/66  Pulse:  91 86 93  Temp:  98.2 F (36.8 C) 98 F (36.7 C) 98 F (36.7 C)  TempSrc:  Oral Oral Oral  Resp:  18 18   Height:      Weight:      SpO2: 97% 98% 96% 98%    Intake/Output Summary (Last 24 hours) at 10/06/13 1701 Last data filed at 10/06/13 1300  Gross per 24 hour  Intake   1470 ml  Output    525 ml  Net    945 ml   Filed Weights   10/04/13 1539  Weight: 58.423 kg (128 lb 12.8 oz)    Exam:   General:  AA Ox3  Cardiovascular: RRR  Respiratory: CTA B  Abdomen: S/NT/ND/+BS  Extremities: no C/C/E   Neurologic:  Intact and non-focal.  Data Reviewed: Basic Metabolic Panel:  Recent Labs Lab 10/04/13 1150 10/04/13 1728 10/05/13 0545  NA 139  --  139  K 4.2  --  4.7  CL 102  --  105  CO2 21  --  23  GLUCOSE 153*  --  135*  BUN 25*  --  21  CREATININE 1.18 1.17 1.06  CALCIUM 8.7  --  8.4   Liver Function Tests:  Recent Labs Lab 10/04/13 1150  AST 28  ALT 32  ALKPHOS 78  BILITOT 0.6  PROT 6.2  ALBUMIN 2.6*   No results found for this basename: LIPASE, AMYLASE,  in the last 168 hours No results found for this basename:  AMMONIA,  in the last 168 hours CBC:  Recent Labs Lab 10/04/13 1150 10/04/13 1728 10/05/13 0545  WBC 9.4 8.8 7.0  NEUTROABS 7.6  --   --   HGB 11.3* 10.3* 10.3*  HCT 35.6* 32.5* 31.9*  MCV 92.2 92.6 91.7  PLT 360 363 369   Cardiac Enzymes:  Recent Labs Lab 10/04/13 1150 10/04/13 1515  TROPONINI <0.30 <0.30   BNP (last 3 results)  Recent Labs  10/04/13 1150  PROBNP 387.5   CBG: No results found for this basename: GLUCAP,  in the last 168 hours  No results found for this or any previous visit (from the past 240 hour(s)).   Studies: No results found.  Scheduled Meds: . aspirin  81 mg Oral QPM  . budesonide-formoterol  2 puff Inhalation BID  . ceFEPime (MAXIPIME) IV  1 g Intravenous Q12H  . cyclophosphamide  50 mg Oral Daily  . dextromethorphan-guaiFENesin  2 tablet Oral BID  . dutasteride  0.5 mg Oral QODAY  . enoxaparin (LOVENOX) injection  40 mg Subcutaneous Q24H  . methylPREDNISolone (SOLU-MEDROL) injection  80 mg Intravenous Q12H  . pantoprazole  40 mg Oral Daily  . psyllium  1 packet Oral Daily  . sodium chloride  3 mL Intravenous Q12H  .  sodium chloride  3 mL Intravenous Q12H  . sulfamethoxazole-trimethoprim  1 tablet Oral Daily  . tamsulosin  0.4 mg Oral QPM  . vancomycin  500 mg Intravenous Q12H   Continuous Infusions:   Principal Problem:   SOB (shortness of breath) Active Problems:   Wegener's granulomatosis   Bronchiectasis with acute exacerbation   SVT (supraventricular tachycardia)   Cough   Weakness generalized   Wegener's disease, pulmonary    Time spent: 25 minutes. Greater than 50% of this time was spent in direct contact with the patient coordinating care.    Lelon Frohlich  [facility] Pager [phone]  If 7PM-7AM, please contact night-coverage at [email or web address], password [facility] 10/06/2013, 5:01 PM  LOS: 2 days

## 2013-10-07 ENCOUNTER — Encounter: Payer: Self-pay | Admitting: Internal Medicine

## 2013-10-07 ENCOUNTER — Inpatient Hospital Stay (HOSPITAL_COMMUNITY): Payer: Medicare Other

## 2013-10-07 LAB — VANCOMYCIN, TROUGH: VANCOMYCIN TR: 9.6 ug/mL — AB (ref 10.0–20.0)

## 2013-10-07 LAB — BASIC METABOLIC PANEL
BUN: 29 mg/dL — ABNORMAL HIGH (ref 6–23)
CO2: 22 mEq/L (ref 19–32)
Calcium: 8.2 mg/dL — ABNORMAL LOW (ref 8.4–10.5)
Chloride: 107 mEq/L (ref 96–112)
Creatinine, Ser: 1.15 mg/dL (ref 0.50–1.35)
GFR, EST AFRICAN AMERICAN: 70 mL/min — AB (ref >90)
GFR, EST NON AFRICAN AMERICAN: 60 mL/min — AB (ref >90)
Glucose, Bld: 125 mg/dL — ABNORMAL HIGH (ref 70–99)
POTASSIUM: 4.6 meq/L (ref 3.7–5.3)
SODIUM: 141 meq/L (ref 137–147)

## 2013-10-07 LAB — CBC
HCT: 30.6 % — ABNORMAL LOW (ref 39.0–52.0)
HEMOGLOBIN: 9.7 g/dL — AB (ref 13.0–17.0)
MCH: 29.3 pg (ref 26.0–34.0)
MCHC: 31.7 g/dL (ref 30.0–36.0)
MCV: 92.4 fL (ref 78.0–100.0)
Platelets: 361 10*3/uL (ref 150–400)
RBC: 3.31 MIL/uL — ABNORMAL LOW (ref 4.22–5.81)
RDW: 14.8 % (ref 11.5–15.5)
WBC: 10.8 10*3/uL — AB (ref 4.0–10.5)

## 2013-10-07 MED ORDER — VANCOMYCIN HCL IN DEXTROSE 750-5 MG/150ML-% IV SOLN
750.0000 mg | Freq: Two times a day (BID) | INTRAVENOUS | Status: DC
  Administered 2013-10-07: 750 mg via INTRAVENOUS
  Filled 2013-10-07 (×2): qty 150

## 2013-10-07 MED ORDER — PREDNISONE 10 MG PO TABS: 10.0000 mg | ORAL_TABLET | Freq: Every day | ORAL | Status: DC

## 2013-10-07 MED ORDER — SULFAMETHOXAZOLE-TMP DS 800-160 MG PO TABS: 1.0000 | ORAL_TABLET | Freq: Every day | ORAL | Status: DC

## 2013-10-07 MED ORDER — CIPROFLOXACIN HCL 500 MG PO TABS: 500.0000 mg | ORAL_TABLET | Freq: Two times a day (BID) | ORAL | Status: DC

## 2013-10-07 NOTE — Discharge Summary (Signed)
Physician Discharge Summary  Richard [name] WVP:[id number] DOB: [birth year] DOA: 10/04/2013  PCP: [name], MD  Admit date: 10/04/2013 Discharge date: 10/07/2013  Time spent: 45 minutes  Recommendations for Outpatient Follow-up:  -Will be discharged home today. -Advised to secure follow up with Dr. [name] in 1 week.   Discharge Diagnoses:  Principal Problem:   SOB (shortness of breath) Active Problems:   Wegener's granulomatosis   Bronchiectasis with acute exacerbation   SVT (supraventricular tachycardia)   Cough   Weakness generalized   Wegener's disease, pulmonary   Discharge Condition: Stable and improved  Filed Weights   10/04/13 1539  Weight: 58.423 kg (128 lb 12.8 oz)    History of present illness:  Patient is a pleasant 78 year old white man with a past medical history significant for Wegener's granulomatosis diagnosed in 2011 was on Cytoxan until 2013. He follows with Dr. [name]. He is maintained on prednisone 10 mg daily. He had been feeling well until mid January at which point a chest x-ray showed increased nodules and Dr. [name] decided to restart Cytoxan. Patient came into the hospital with the above-mentioned complaints. His heart rate was noted to be in the 180s in the rhythm of SVT. He converted to sinus rhythm spontaneously. CT scan of the chest shows bronchiectasis as well as numerous cavitary and non-cavitary nodules. We have been asked to admit him for further evaluation and management. In addition to his cough and mild shortness of breath, he endorses low-grade temperatures for about 2 weeks as well as increasing sinus symptoms.   Hospital Course:   Wegener's Granulomatosis/Bronchiectasis with Acute Exacerbation  -Cx data remains negative to date. -As discussed with Dr. [name], will transition antibiotics to cipro for 3 more days, continue cytoxan (bactrim while on cytoxan for PCP prophylaxis), and slow steroid taper until seen again by Dr. [name].  SVT   -Spontaneously converted to NSR.   Procedures:  None   Consultations:  Pulmonary  Discharge Instructions  Discharge Orders   Future Appointments Provider Department Dept Phone   10/25/2013 11:15 AM Tanda Rockers, MD [facility] [phone]   Future Orders Complete By Expires   Discontinue IV  As directed    Increase activity slowly  As directed        Medication List    STOP taking these medications       amoxicillin-clavulanate 875-125 MG per tablet  Commonly known as:  AUGMENTIN      TAKE these medications       aspirin 81 MG tablet  Take 81 mg by mouth every evening.     b complex vitamins tablet  Take 1 tablet by mouth daily.     bisacodyl 5 MG EC tablet  Commonly known as:  DULCOLAX  Take 5 mg by mouth daily as needed. CONSTIPATION     budesonide-formoterol 160-4.5 MCG/ACT inhaler  Commonly known as:  SYMBICORT  Inhale 2 puffs into the lungs 2 (two) times daily.     ciprofloxacin 500 MG tablet  Commonly known as:  CIPRO  Take 1 tablet (500 mg total) by mouth 2 (two) times daily.     cyclophosphamide 50 MG tablet  Commonly known as:  CYTOXAN  Take 1 tablet (50 mg total) by mouth daily. Give on an empty stomach 1 hour before or 2 hours after meals.     dextromethorphan 30 MG/5ML liquid  Commonly known as:  DELSYM  Take 30 mg by mouth 2 (two) times daily as needed for cough.  dextromethorphan-guaiFENesin 30-600 MG per 12 hr tablet  Commonly known as:  MUCINEX DM  Take 2 tablets by mouth 2 (two) times daily.     dutasteride 0.5 MG capsule  Commonly known as:  AVODART  Take 0.5 mg by mouth every other day. In the evening.     esomeprazole 20 MG capsule  Commonly known as:  NEXIUM  Take 20 mg by mouth daily.     GAS-X PO  Take 2 tablets by mouth 2 (two) times daily.     levalbuterol 45 MCG/ACT inhaler  Commonly known as:  XOPENEX HFA  Inhale 1-2 puffs into the lungs every 4 (four) hours as needed for wheezing.     loratadine  10 MG tablet  Commonly known as:  CLARITIN  Take 10 mg by mouth daily as needed for allergies.     predniSONE 10 MG tablet  Commonly known as:  DELTASONE  Take 1 tablet (10 mg total) by mouth daily with breakfast. Take 6 tab for 2 days, 5 tab for 2 days, 4 tab for 2 days, 3 tab for 2 days, take 2 tab daily until seen by Dr. [name]     psyllium 95 % Pack  Commonly known as:  HYDROCIL/METAMUCIL  Take 1 packet by mouth daily.     sulfamethoxazole-trimethoprim 800-160 MG per tablet  Commonly known as:  BACTRIM DS  Take 1 tablet by mouth daily.     tamsulosin 0.4 MG Caps capsule  Commonly known as:  FLOMAX  Take 0.4 mg by mouth every evening.     traMADol 50 MG tablet  Commonly known as:  ULTRAM  1-2 every 4 hours as needed for cough or pain       Allergies  Allergen Reactions  . Factive [Gemifloxacin]     Ineffective        Follow-up Information   Follow up with Christinia Gully, MD. Schedule an appointment as soon as possible for a visit in 1 week.   Specialty:  Pulmonary Disease   Contact information:   [address] [place] [address] [phone]        The results of significant diagnostics from this hospitalization (including imaging, microbiology, ancillary and laboratory) are listed below for reference.    Significant Diagnostic Studies: Dg Chest 2 View  10/07/2013   CLINICAL DATA:  bronchiectasis, wegener's  EXAM: CHEST  2 VIEW  COMPARISON:  CT ANGIO CHEST W/CM &/OR WO/CM dated 10/04/2013; DG CHEST 2 VIEW dated 10/04/2013; DG CHEST 2 VIEW dated 09/06/2013; DG CHEST 2 VIEW dated 08/19/2013  FINDINGS: The lungs are hyperinflated likely secondary to COPD. There are bilateral cavitary lung nodules, right greater than left better characterized on recent CT chest dated 10/04/2013. There is bilateral stable chronic reticular interstitial prominence. There is no focal consolidation, pleural effusion or pneumothorax. The heart and mediastinal contours are unremarkable.  The  osseous structures are unremarkable.  IMPRESSION: No active cardiopulmonary disease.  Bilateral cavitary lung nodules and interstitial prominence likely reflecting sequela of Wegner's disease given the patient's history.   Electronically Signed   By: Kathreen Devoid   On: 10/07/2013 08:20   Dg Chest 2 View  10/04/2013   CLINICAL DATA:  Cough, fever, Wegener granulomatosis  EXAM: CHEST  2 VIEW  COMPARISON:  None.  FINDINGS: Cardiomediastinal silhouette is stable. Bilateral cavitary lung nodules again noted probable due to granulomatosis. Stable chronic reticular interstitial prominence. No definite superimposed infiltrate or pulmonary edema. Bony thorax is stable.  IMPRESSION: No  definite infiltrate or pulmonary edema. Bilateral pulmonary nodules some with cavitation probable due to granulomatosis. Stable chronic reticular interstitial prominence.   Electronically Signed   By: Lahoma Crocker M.D.   On: 10/04/2013 13:06   Ct Angio Chest Pe W/cm &/or Wo Cm  10/04/2013   CLINICAL DATA:  Shortness of breath. Cough. Weakness. Chest pain. History of supraventricular tachycardia.  EXAM: CT ANGIOGRAPHY CHEST WITH CONTRAST  TECHNIQUE: Multidetector CT imaging of the chest was performed using the standard protocol during bolus administration of intravenous contrast. Multiplanar CT image reconstructions and MIPs were obtained to evaluate the vascular anatomy.  CONTRAST:  119mL OMNIPAQUE IOHEXOL 350 MG/ML SOLN  COMPARISON:  No priors.  FINDINGS: Mediastinum: There are no filling defects within the pulmonary arterial tree to suggest underlying pulmonary embolism. Heart size is normal. There is no significant pericardial fluid, thickening or pericardial calcification. There is atherosclerosis of the thoracic aorta, the great vessels of the mediastinum and the coronary arteries, including calcified atherosclerotic plaque in the left main, left anterior descending, left circumflex and right coronary arteries. Numerous prominent  borderline enlarged mediastinal and hilar lymph nodes are noted, presumably reactive. No definite pathologic nodal enlargement. Large hiatal hernia.  Lungs/Pleura: Diffuse bronchial wall thickening with patchy areas of more severe thickening of the bronchial walls and the peribronchovascular interstitium. Extensive multifocal cylindrical, varicose and cystic bronchiectasis in a patchy distribution throughout the lungs bilaterally. Many of the bronchiectatic regions result in what appear to be thick-walled cavitary nodules, however, the majority of these are actually dilated bronchi. There are multiple additional nodules scattered throughout the lungs bilaterally, largest of which on the left side, including a solid-appearing 1.5 x 1.2 cm left upper lobe nodule (image 43 of series 7), as well as a pleural-based left lower lobe nodule which is also solid in appearance measuring 2.7 x 2.4 cm. Multiple smaller solid and cavitary appearing nodules are also noted. No pleural effusions.  Upper Abdomen: Unremarkable.  Musculoskeletal: There are no aggressive appearing lytic or blastic lesions noted in the visualized portions of the skeleton.  Review of the MIP images confirms the above findings.  IMPRESSION: 1. No evidence of pulmonary embolism. 2. Spectrum of findings in the lungs, as detailed above, presumably a manifestation of the patient's Wegener's granulomatosis, including severe multifocal bronchiectasis as well as numerous cavitary and non-cavitary nodules. As there is potential for malignant disease involving any one of these nodules (not favored at this time), a short-term follow-up chest CT in 3 months is suggested to confirm the stability or resolution of the largest of these lesions in the left upper and left lower lobes, as discussed above. 3. Atherosclerosis, including left main and 3 vessel coronary artery disease. Please note that although the presence of coronary artery calcium documents the presence of  coronary artery disease, the severity of this disease and any potential stenosis cannot be assessed on this non-gated CT examination. Assessment for potential risk factor modification, dietary therapy or pharmacologic therapy may be warranted, if clinically indicated. 4. Large hiatal hernia.   Electronically Signed   By: Vinnie Langton M.D.   On: 10/04/2013 13:38   [place] Cm  10/04/2013   CLINICAL DATA:  Chronic rhinitis  EXAM: CT PARANASAL SINUS WITHOUT CONTRAST  TECHNIQUE: Multidetector CT images of the paranasal sinuses were obtained using the standard protocol without intravenous contrast.  COMPARISON:  CT head 04/11/2010  FINDINGS: Mild to moderate mucosal thickening in the paranasal sinuses diffusely. Left frontal sinus is clear.  Right frontal sinus is hypoplastic.  Mastoid sinus is clear bilaterally.  No air-fluid level.  No acute bony abnormality.  IMPRESSION: Mild to moderate mucosal thickening in the paranasal sinuses without air-fluid level.   Electronically Signed   By: Franchot Gallo M.D.   On: 10/04/2013 13:43    Microbiology: No results found for this or any previous visit (from the past 240 hour(s)).   Labs: Basic Metabolic Panel:  Recent Labs Lab 10/04/13 1150 10/04/13 1728 10/05/13 0545 10/07/13 0510  NA 139  --  139 141  K 4.2  --  4.7 4.6  CL 102  --  105 107  CO2 21  --  23 22  GLUCOSE 153*  --  135* 125*  BUN 25*  --  21 29*  CREATININE 1.18 1.17 1.06 1.15  CALCIUM 8.7  --  8.4 8.2*   Liver Function Tests:  Recent Labs Lab 10/04/13 1150  AST 28  ALT 32  ALKPHOS 78  BILITOT 0.6  PROT 6.2  ALBUMIN 2.6*   No results found for this basename: LIPASE, AMYLASE,  in the last 168 hours No results found for this basename: AMMONIA,  in the last 168 hours CBC:  Recent Labs Lab 10/04/13 1150 10/04/13 1728 10/05/13 0545 10/07/13 0510  WBC 9.4 8.8 7.0 10.8*  NEUTROABS 7.6  --   --   --   HGB 11.3* 10.3* 10.3* 9.7*  HCT 35.6* 32.5* 31.9*  30.6*  MCV 92.2 92.6 91.7 92.4  PLT 360 363 369 361   Cardiac Enzymes:  Recent Labs Lab 10/04/13 1150 10/04/13 1515  TROPONINI <0.30 <0.30   BNP: BNP (last 3 results)  Recent Labs  10/04/13 1150  PROBNP 387.5   CBG: No results found for this basename: GLUCAP,  in the last 168 hours     Signed:  Lelon Frohlich  [facility] Pager: [phone] 10/07/2013, 3:43 PM

## 2013-10-07 NOTE — Progress Notes (Signed)
Quick Note:  Pt aware ______ 

## 2013-10-07 NOTE — Progress Notes (Signed)
ANTIBIOTIC CONSULT NOTE -Follow Up  Pharmacy Consult for Vancomycin Indication: bronchiectasis and sinusitis  Allergies  Allergen Reactions  . Factive [Gemifloxacin]     Ineffective     Patient Measurements: Height: 5\' 6"  (167.6 cm) Weight: 128 lb 12.8 oz (58.423 kg) IBW/kg (Calculated) : 63.8  Vital Signs: Temp: 97.8 F (36.6 C) (03/02 0515) Temp src: Oral (03/02 0515) BP: 144/76 mmHg (03/02 0515) Pulse Rate: 72 (03/02 0515)  Labs:  Recent Labs  10/04/13 1728 10/05/13 0545 10/07/13 0510  WBC 8.8 7.0 10.8*  HGB 10.3* 10.3* 9.7*  PLT 363 369 361  CREATININE 1.17 1.06 1.15   Estimated Creatinine Clearance: 45.8 ml/min (by C-G formula based on Cr of 1.15).  Microbiology: No results found for this or any previous visit (from the past 720 hour(s)).  Medical History: Past Medical History  Diagnosis Date  . Allergy   . GERD (gastroesophageal reflux disease)   . Arthritis   . Asthma   . Fungal infection     lungs  . Cataract   . Ulcer   . BPH (benign prostatic hyperplasia)   . Wegener's granulomatosis 2011    sees Dr. [name]   . Peptic stricture of esophagus   . Anemia   . Hiatal hernia   . Adenomatous colon polyp 08/2011  . Bronchiectasis   . On home oxygen therapy 12-18-12    uses 2 l/m nasally at bedtime  . HOH (hard of hearing) 12-18-12    bilaterally  . HBP (high blood pressure) 09/28/2013    Medications:  Anti-infectives   Start     Dose/Rate Route Frequency Ordered Stop   10/07/13 0700  vancomycin (VANCOCIN) IVPB 750 mg/150 ml premix     750 mg 150 mL/hr over 60 Minutes Intravenous Every 12 hours 10/07/13 0622     10/05/13 1600  sulfamethoxazole-trimethoprim (BACTRIM DS) 800-160 MG per tablet 1 tablet     1 tablet Oral Daily 10/05/13 1508     10/05/13 0600  vancomycin (VANCOCIN) 500 mg in sodium chloride 0.9 % 100 mL IVPB  Status:  Discontinued     500 mg 100 mL/hr over 60 Minutes Intravenous Every 12 hours 10/04/13 1715 10/07/13 0622   10/04/13  1800  ceFEPIme (MAXIPIME) 1 g in dextrose 5 % 50 mL IVPB     1 g 100 mL/hr over 30 Minutes Intravenous Every 12 hours 10/04/13 1650     10/04/13 1730  vancomycin (VANCOCIN) IVPB 1000 mg/200 mL premix     1,000 mg 200 mL/hr over 60 Minutes Intravenous  Once 10/04/13 1715 10/04/13 1942     Assessment: Richard [name] admitted 2/27 with SOB, productive cough, and weakness.  PMH includes Wegener's granulomatosis (dx 2011) on Cytoxan and prednisone.  Suspected of having subacute Wegener's exacerbation, acute infectious bronchiectasis, and sinusitis.  Cefepime dosing per MD.  Pharmacy is consulted to dose IV vancomycin.  Day #4  Tmax: Afebrile  WBCs: 10.8  Renal: SCr 1.15, CrCl ~ 45 ml/min  Goal of Therapy:  Vancomycin trough level 15-20 mcg/ml  Plan:   Increase Vancomycin to 750mg  IV q12h  Measure Vanc trough at steady state as needed  Follow up renal fxn and culture results.  Leone Haven, PharmD  10/07/2013 6:23 AM

## 2013-10-07 NOTE — Progress Notes (Signed)
Name: Richard [name]  MRN: [id number]  DOB: 1936/02/23  ADMISSION DATE: 10/04/2013  CONSULTATION DATE: 2/27  REFERRING MD : Jerilee Hoh  PRIMARY SERVICE: TRH  BRIEF PATIENT DESCRIPTION:  70 M followed by MW in [facility] pulm office with Wegener's granulomatosis adm 2/27 by [facility] with relapsing symptoms of several wks duration    HISTORY OF PRESENT ILLNESS:  Dr Gustavus Bryant recent progress note from 2/20 documents in detail this gentleman's hx of WG and has been reviewed in detail. He was initially diagnosed 03/2010. He was treated with cyclophosphamide and prednisone. He went into remission and cyclophosphamide was discontinued in early 2013. He was seen in office in mid January of this year with development of weakness, anorexia, intermittent fever and malaise. He was started back on cyclophosphamide 50 mg daily. His symptoms have progressed and he attributes worsening weakness, cough, anorexia and SOB to the cyclophosphamide. He presented to [facility] ED with same symptoms of weakness, malaise, cough productive of yellow sputum, SOB, anorexia all of several weeks duration. He denies hemoptysis, CP, N/V/D, abd pain, dysuria. He had an episode of PSVT while in ED. Admitted to [facility] service. PCCM asked to address therapy of WG and resp symptoms  SUBJECTIVE:  VITAL SIGNS:  Temp: [98.1 F (36.7 C)-98.5 F (36.9 C)] 98.5 F (36.9 C) (02/27 1539)  Pulse Rate: [89-186] 109 (02/27 1539)  Resp: [16-20] 18 (02/27 1539)  BP: (102-135)/(59-94) 135/70 mmHg (02/27 1539)  SpO2: [93 %-96 %] 95 % (02/27 1539)  Weight: [58.423 kg (128 lb 12.8 oz)] 58.423 kg (128 lb 12.8 oz) (02/27 1539)   INTAKE / OUTPUT:  Intake/Output  None   PHYSICAL EXAMINATION:  General: Thin, not cachectic, NAD @ rest and on RA  Neuro: No focal deficits  HEENT: WNL. No saddle nose noted  Cardiovascular: RRR s M  Lungs: coarse exp BS scattered, few bilat rhonchi  Abdomen: Soft, NT, NABS  Ext: warm, no edema  Recent Labs Lab 10/04/13 1150  10/04/13 1728 10/05/13 0545 10/07/13 0510  NA 139  --  139 141  K 4.2  --  4.7 4.6  CL 102  --  105 107  CO2 21  --  23 22  BUN 25*  --  21 29*  CREATININE 1.18 1.17 1.06 1.15  GLUCOSE 153*  --  135* 125*    Recent Labs Lab 10/04/13 1728 10/05/13 0545 10/07/13 0510  HGB 10.3* 10.3* 9.7*  HCT 32.5* 31.9* 30.6*  WBC 8.8 7.0 10.8*  PLT 363 369 361    Dg Chest 2 View  10/07/2013   CLINICAL DATA:  bronchiectasis, wegener's  EXAM: CHEST  2 VIEW  COMPARISON:  CT ANGIO CHEST W/CM &/OR WO/CM dated 10/04/2013; DG CHEST 2 VIEW dated 10/04/2013; DG CHEST 2 VIEW dated 09/06/2013; DG CHEST 2 VIEW dated 08/19/2013  FINDINGS: The lungs are hyperinflated likely secondary to COPD. There are bilateral cavitary lung nodules, right greater than left better characterized on recent CT chest dated 10/04/2013. There is bilateral stable chronic reticular interstitial prominence. There is no focal consolidation, pleural effusion or pneumothorax. The heart and mediastinal contours are unremarkable.  The osseous structures are unremarkable.  IMPRESSION: No active cardiopulmonary disease.  Bilateral cavitary lung nodules and interstitial prominence likely reflecting sequela of Wegner's disease given the patient's history.   Electronically Signed   By: Kathreen Devoid   On: 10/07/2013 08:20    ASSESSMENT:  Wegener's granulomatosis - probable subacute exacerbation  Acute infectious bronchiectatic exacerbation  Acute (and probably chronic) sinusitis -  likely a manifestation of WG  Recommendations:  - continue cytoxan and steroids -needs to be on PCP prophylaxis with bactrim daily while on cytoxan -OK to dc home , follow with Dr. [name] as opt.  Richardson Landry Minor ACNP Maryanna Shape PCCM Pager [phone] till 3 pm If no answer page [phone] 10/07/2013, 12:48 PM  Baltazar Apo, MD, PhD 10/07/2013, 3:10 PM [facility] [phone] or if no answer [phone]

## 2013-10-11 ENCOUNTER — Other Ambulatory Visit: Payer: Self-pay | Admitting: Gastroenterology

## 2013-10-15 ENCOUNTER — Ambulatory Visit (INDEPENDENT_AMBULATORY_CARE_PROVIDER_SITE_OTHER): Payer: Medicare Other | Admitting: Internal Medicine

## 2013-10-15 ENCOUNTER — Encounter: Payer: Self-pay | Admitting: Internal Medicine

## 2013-10-15 VITALS — BP 130/68 | HR 105 | Temp 97.9°F | Ht 68.0 in | Wt 130.8 lb

## 2013-10-15 DIAGNOSIS — M3130 Wegener's granulomatosis without renal involvement: Secondary | ICD-10-CM

## 2013-10-15 DIAGNOSIS — J310 Chronic rhinitis: Secondary | ICD-10-CM

## 2013-10-15 DIAGNOSIS — J479 Bronchiectasis, uncomplicated: Secondary | ICD-10-CM

## 2013-10-15 DIAGNOSIS — Z23 Encounter for immunization: Secondary | ICD-10-CM

## 2013-10-15 NOTE — Patient Instructions (Signed)
No change in medications  Change follow up appt to 4 weeks with cxr

## 2013-10-15 NOTE — Progress Notes (Signed)
Subjective:    Patient ID: Richard [name], male    DOB: Feb 12, 1936     MRN: [id number]  Brief patient profile:  78  yowm never smoker with dx of Longstanding bronchiectasis then dx with WG 03/2010 > requested establish with Richard [name].   Admit Richard [name] dx WG by pos c anca 03/2010, supportive tbbx but not vats   Admit Richard [name] dx spont L Ptx 9/08/15/09 requiring Chest tube but no surgery   05/24/10--Richard [name] ov/ med reivew. Since last visit. has been hospitalized 05/12/2010-- 10/10/2011for Right pneumothorax., with underlying Wegener granulomatosis. and . Bronchiectasis. Pt was found to have Right Pneumo. Chest tube was placed. with improvement and removed 10/7. Xray on 10/10 w/ resolved pnuemo. He was continued on cytoxan and bactrim for PCP prophylaxis. rec. Prednisone 20 mg reduce to one half daily  Aspirin 81 mg one daily with breakfast but stop for any bleeding  See Patient Care Coordinator before leaving for cardiology appt > saw Richard [name], ? LHC needed     October 13, 2010 --Presents for an acute office visit. Complains of productive cough with yellow mucus, sore throat, rattling in chest, low grade temp x3days. Currently on pred 1/2 every other day . OTC not helping. rec continue qod pred, rx with augmentin x 10 days   11/16/2010 ov all smiles, no change on days of prednisone, minimal discolored sputum.   Try off prednisone completely.  If you feel a lot worse over the next  week(worse breathing/ coughing/ nausea or loss of appetite) resume prednisone 5 mg one half daily until better then return back one half every other day    12/16/2010 ov/Richard [name] cc no better on 5mg  dosed one half every other day (not the dose rec), still nauseated, still discolored p doxy complete May 7, sob in shower but not as much walking and no longer using 02, struggling with contingency plans. rec Work on inhaler technique:  If short of breath go ahead and use the xopenex hfa 2 puffs every 4 hours  Prednisone 5 mg 4 daily until better, then  2 daily x 5 days, then 1 daily thereafter   12/31/10 ov/Richard [name], exac of bronchiectasis/wheeze no hemoptysis rx with pred burst and levaquin 750 x 5d  01/13/2011 ov/Richard [name]  Cc sob and cough better now tapered prednisone to 5 mg each am using xop once daily around noon no problem with sleep or am exac of cough.   rec  02/11/2011 Richard [name] cc recurrent hemoptysis on top of yellow that usually comes up esp in am x one year but no epistaxis, no cp or arthralgias or increased doe. rec Levaquin 750 mg x 5 days courses whenever mucus turns bloody, more dark or with high fever. Stop cytoxan and sulfasoxazole  Don't take aspirin if any active bleeding   03/16/2011 f/u ov/Richard [name] cc no more blood,  Minimal yellow mucus, back on asa 81 without hemoptysis. No sob rec  Try prednisone 5 mg  One on even and a half on odd   Acute Visit 04/26/11 -Richard [name]  Cc  increased chest congestion, yellow sputum beginning about 1 week ago. He took a levaquin x 5 days (9/11 - 9/15). Has a sore throat, getting better. Nasal drainage and sinus drainage worse over last 3 -4 days. He tells me that he cut grass last Thursday, may have started this. No sick contacts.  rec Please start levaquin daily for another 5 days Continue your current prednisone dosing  Start loratadine 10mg  daily until your next  visit    05/27/2011 f/u ov/Richard [name] on 2.5 mg pred /day  cc still coughing, congested though much less purulent sputum p levaquin and no hemoptysis.  Does note fatigue > baseline  With doe worse x garbage cans to street and back and has not tried the xopenex at all. rec Try using xopenex as needed for short of breath Only take the loratidine (clariton) if needed for itching sneezing runny nose     07/19/2011 ER follow up 07/15/11 with increased cough, congestion and fever. Dx with acute bronchiectasis and possible influenza. He has started on Levaquin 750mg  x 5 days w/ last day today. Also given Tamiflu but did not fill this. Fevers are  decreasing. Cough and congesiton are improved but not gone. Still feels weak.CXR w/o acute changes.  Labs from ER reviewed with elevated WBC 11k, w/ left shift. Neg strep test. No influenza data found.  rec Extend Levaquin daily for 5 days  Mucinex DM Twice daily  As needed  Cough/congestion  Fluids and rest  Tylenol As needed  Fever   08/24/2011 f/u ov/Richard [name] on Prednisone 5 mg one half each am  cc cough/ congestion back to baseline, no limiting sob. rec Try prednisone 5 mg one half pill alternate days to see if any worse respiratory symptoms or nausea, fatigue get worse Richard to adjust the mucinex dm to take up to 2 every 12 hours as needed For itching sneezing and runny nose, use the loratidine 10 mg (clariton)   OV 09/28/2011 ACUTE OFFICE VISIT WITH Richard Richard [name] with flare cough  rec Please take Take prednisone 40mg  once daily x 3 days, then 30mg  once daily x 3 days, then 20mg  once daily x 3 days, then prednisone 10mg  once daily  x 3 days and then 5mg  daily x 3 days and then to your baseline regimen of alternating  11/28/2011 f/u ov/Richard [name] cc much better on higher doses of prednisone but worse w/in 5 days of floor of qod regimen with increase cough/ congestion minimally discolored sputum no epistaxis hemoptysis or aches/ pains x vague discomfort r ant chest under breast.  No hematuria rec Prednisone 20 mg per day until better, then 10 mg per day x 5 days, and then 5 mg per day.    05/31/2012 f/u ov/Richard [name] cc no change in cough, yellowish, thick, worse in ams,  not  Bloody- no epistaxis, rash, arthralgias or sob.   rec Try prednisone 5 mg  One on even and one-half  on odd - if no change then 2 weeks before your next scheduled pulmonary visit take one half every day   10/11/2012 f/u ov/Richard [name] still on one on even and half on odd prednisone(did not taper as per instructions, though no flare in symptoms), rare need for levaquin rec Prednisone 5 mg pill take one half daily  11/23/2012 f/u ov/Richard [name] re  WG/ bronchiectasis Chief Complaint  Patient presents with  . Follow-up    Had PFT today-sob same,cough-yellow,wheezing occass.,  no hemoptysis, no real limiting sob though sedentary, no arthralgias or rash maintaining 2.5 mg daily No change prednisone 5 mg one half daily  > Richard to try every other day if doing great May 1 flare rx levaquin and pred May 16 lap chole [facility]   02/19/2013 f/u ov/Richard [name] re WG and bronchietasis still on 5 mg one half daily  Chief Complaint  Patient presents with  . Follow-up    Pt c/o increased cough x 2 wks- prod with minimal yellow sputum. He also  has some increased SOB since increased cough started.   symptoms came on gradually with min proression and no change rx yet > has saba but not using.  Cough better with mucinex. No hemoptysis or epistaxis. Sob only with exertion rec When wheezing or coughing is worse, increase prednisone to 20 mg daily until better, then 10 mg day x 3 days and 5 mg per day x 3 days then 2.5 mg per day thereafter. mucinex dose is 600 take 2 every 12 hours as needed for cough/ congestion/ thick mucus   04/26/2013 f/u ov/Richard [name] re: WG/ bronchiectasis p completed 10 days of levaquin 750 on 9/14  Chief Complaint  Patient presents with  . Follow-up    Breathing better. cough w/ yellow tint phlem.    Mucus never Never bloody and much lighter p levaquin, no need for saba on symb 160 2bid - wife concerned levaquin resistance developing. rec factive one daily x 5 days next time mucus gets nasty> did not tolerate Prednisone ceiling is 10 mg per day and floor is 5 mg daily  Only use your xopenex as a rescue medication   07/26/2013 f/u ov/Richard [name] re: bronchiectasis / hj/o WG Chief Complaint  Patient presents with  . Follow-up    Breathing is unchanged since the last visit. Still has prod cough with moderate yellow sputum.  He also c/o chest tightness since he stoped levaquin approx 10 days ago.   Not using flutter or xopenex very much at all.  Maintaining floor of 5 mg daily  rec Cipro 750 twice daily x 10 days if you get nasty mucus Use the flutter valve and chest percussion    08/19/2013 f/u ov/Richard [name] re: ? Recurrent WG Chief Complaint  Patient presents with  . Follow-up    Breathing is unchanged. Cough unchanged-prod cpugh w/ yellow phlem. On 2nd dose of cipro  now on day 5/10 cipro and less dark, never bloody, no sinus complaints, arthritis rash or fever or hematuria. >ANCA 1:80 titer, started on cytoxan   09/06/2013 Acute OV  Complains of  weakness, decreased appetite, temp up to 101, wheezing, increased SOB, prod cough with small amounts of yellow mucus x 5days.  Began Cipro 750mg  09/03/13  Fever is better . Was started on cytoxan on 1/19 for suspected wegeners flare (w/ ANCA positive 1:80) .  Appetite is poor but weight is stable.  rec Finish Cipro 750 twice daily x 10 days as directed.  Use the flutter valve and chest percussion  Fluids and rest .  Tylenol As needed   Labs and xray >> no change     09/12/2013 f/u ov/Richard [name] re:  Chief Complaint  Patient presents with  . Follow-up    Pt c/o increased SOB, congestion and cough for the past 7-10 days. Cough is prod with minimal yellow sputum. Finished round of cipro on 09/11/13.     Changed pred to 10 mg daily on his own Not using flutter or tramadol  rec Continue prednisone at 10 mg daily for now For cough > mucinex dm up to 1200 mg every 12 hours and use the flutter valve - supplement with tramadol for severe or uncontrolled coughing Augmentin 875 mg take one pill twice daily  X 10 days - take at breakfast and supper with large glass of water.  It would help reduce the usual side effects (diarrhea and yeast infections) if you ate cultured yogurt at lunch.   09/27/2013 f/u ov/Richard [name] re:  WG on pred 10 and ctx  50 mg daily  Chief Complaint  Patient presents with  . Follow-up    Pt states cough and SOB seems worse since the last visit. Still producing yellow sputum. He  states has low grade temp every am.   assoc with nasal congestion but no epistaxis or hemoptysis  rec Stop Zebeta Schedule sinus CT> 2/09/14/13 Mild to moderate mucosal thickening in the paranasal sinuses without  air-fluid level. For cough use the mucinex dm up to 1200 mg every 12 hours and as much flutter valve as possible For breathing use xopenex up to 2 puffs every 4 hours if needed and don't leave home wihout it   Admit date: 10/04/2013  Discharge date: 10/07/2013  Discharge Diagnoses:  SOB (shortness of breath)  Wegener's granulomatosis  Bronchiectasis with acute exacerbation  SVT (supraventricular tachycardia)  Cough  Weakness generalized  Discharge Condition: Stable and improved  Filed Weights    10/04/13 1539   Weight:  58.423 kg (128 lb 12.8 oz)   History of present illness:  Patient is a pleasant 78 year old white man with a past medical history significant for Wegener's granulomatosis diagnosed in 2011 was on Cytoxan until 2013. He follows with Richard. Melvyn [name]. He is maintained on prednisone 10 mg daily. He had been feeling well until mid January at which point a chest x-ray showed increased nodules and Richard. Melvyn [name] decided to restart Cytoxan. Patient came into the hospital with the above-mentioned complaints. His heart rate was noted to be in the 180s in the rhythm of SVT. He converted to sinus rhythm spontaneously. CT scan of the chest shows bronchiectasis as well as numerous cavitary and non-cavitary nodules. We have been asked to admit him for further evaluation and management. In addition to his cough and mild shortness of breath, he endorses low-grade temperatures for about 2 weeks as well as increasing sinus symptoms.  Hospital Course:  Wegener's Granulomatosis/Bronchiectasis with Acute Exacerbation  -Cx data remains negative to date.  -As discussed with Richard. Lamonte [name], will transition antibiotics to cipro for 3 more days, continue cytoxan (bactrim while on cytoxan for PCP prophylaxis),  and slow steroid taper until seen again by Richard. Melvyn [name].  SVT  -Spontaneously converted to NSR.       10/15/2013 post hosp ov/Zeyna Mkrtchyan re: flare of bronchiectasis  Chief Complaint  Patient presents with  . HFU    Breathing unchanged since hospital d/c. His cough is some better.   breathing when walk better but not back to baseline Strength a lot better, cough is a lot better slt yellow sputum Now on bactrim ds daily and cytoxan 50 mg one daily  pred 10 mg x 3 per day > floor of 10 mg    No obvious day to day or daytime variabilty or   cp or chest tightness, subjective wheeze overt   hb symptoms. No unusual exp hx or h/o childhood pna/ asthma or knowledge of premature birth.  Sleeping Richard without nocturnal  or early am exacerbation  of respiratory  c/o's or need for noct saba. Also denies any obvious fluctuation of symptoms with weather or environmental changes or other aggravating or alleviating factors except as outlined above   Current Medications, Allergies, Complete Past Medical History, Past Surgical History, Family History, and Social History were reviewed in [employer] record.  ROS  The following are not active complaints unless bolded sore throat, dysphagia, dental problems, itching, sneezing,  nasal congestion or excess/ purulent secretions, ear ache,   fever, chills, sweats, unintended wt  loss, pleuritic or exertional cp, hemoptysis,  orthopnea pnd or leg swelling, presyncope, palpitations, heartburn, abdominal pain, anorexia, nausea, vomiting, diarrhea  or change in bowel or urinary habits, change in stools or urine, dysuria,hematuria,  rash, arthralgias, visual complaints, headache, numbness weakness or ataxia or problems with walking or coordination,  change in mood/affect or memory.               Objective:   Physical Exam  wt   128  04/26/11 >07/08/2011  134> 134 11/28/2011 > 02/29/2012 136> 05/31/2012  136>  134 11/22/12 > 07/26/2013  136 > 08/21/2013  136  >133 09/06/2013 >  09/12/2013  133 > 132 09/27/2013 > 10/15/2013 131   amb thin pleasant wm nad  SKIN: no rash, lesions  NODES: no lymphadenopathy  HEENT: [place]/AT, EOM- WNL, Conjuctivae- clear, PERRLA, TM-WNL, Nose- clear, Throat- clear and wnl, Mallampati III  NECK: Supple w/ fair ROM, JVD- none, normal carotid impulses w/o bruits Thyroid-  CHEST: min bilateral exp rhonchi  No resp distress HEART: RRR, no m/g/r heard  ABDOMEN: Soft and nl EXT:  No deformities or restrucitons      CT 10/04/13 c/w WG/bronchiectasis   cxr 10/07/13 No active cardiopulmonary disease.  Bilateral cavitary lung nodules and interstitial prominence likely  reflecting sequela of Wegner's disease given the patient's history.        Assessment & Plan:

## 2013-10-19 NOTE — Assessment & Plan Note (Signed)
-   Sinus CT 10/04/13 > Mild to moderate mucosal thickening in the paranasal sinuses without air-fluid level.  No definite acute sinus dz/ chronic changes may well be due to WG, no change in Rx needed

## 2013-10-19 NOTE — Assessment & Plan Note (Addendum)
-  Dx by TBBX/ pos anca 03/2010 > CTX started    - Off all prednisone with baseline esr 31 11/16/2010  > restarted 12/16/2010 with nausea and wt down to 115 but ok sats walking x 3 laps   - Recheck ANCA sent  12/16/2010  >  neg   -Trial off cytoxan starting 02/12/2011 >   03/16/11 stopped  - Prednisone ceiling is 10 mg per day and floor is 5 mg daily as of 04/26/13  - 08/19/13 ANCA POS 1:80 with increasing nodular dz > rx 08/21/2013 cytoxan 50 mg daily   Now also on one bactrim daily and tol well so reasonable to continue both as PCP prophylaxis and because of anecdotal benefit in WG Probably needs to titrate up cytoxan once over this acute phase which is more likely due to bronchiectasis than WG but tough to sort out.

## 2013-10-19 NOTE — Assessment & Plan Note (Signed)
-   HFA 90% p coaching 12/16/2010      - PFT's 06/21/11   FEV1  1.60 (61%) ratio 48 and 10% better p B2,  DLCO 83%     - PFT's 11/22/2012 FEV1  1.52 (62%) arnd 46 and no change p B2 DLCO 89%  The goal with a chronic steroid dependent illness is always arriving at the lowest effective dose that controls the disease/symptoms and not accepting a set "formula" which is based on statistics or guidelines that don't always take into account patient  variability or the natural hx of the dz in every individual patient, which may well vary over time.  For now therefore I recommend the patient maintain  A floor of 10 mg daily for now

## 2013-10-24 ENCOUNTER — Encounter: Payer: Self-pay | Admitting: Family Medicine

## 2013-10-24 ENCOUNTER — Telehealth: Payer: Self-pay | Admitting: Internal Medicine

## 2013-10-24 ENCOUNTER — Ambulatory Visit (INDEPENDENT_AMBULATORY_CARE_PROVIDER_SITE_OTHER): Payer: Medicare Other | Admitting: Family Medicine

## 2013-10-24 VITALS — BP 126/64 | HR 120 | Temp 98.4°F | Ht 68.0 in | Wt 126.0 lb

## 2013-10-24 DIAGNOSIS — J019 Acute sinusitis, unspecified: Secondary | ICD-10-CM

## 2013-10-24 DIAGNOSIS — J449 Chronic obstructive pulmonary disease, unspecified: Secondary | ICD-10-CM

## 2013-10-24 DIAGNOSIS — I471 Supraventricular tachycardia: Secondary | ICD-10-CM

## 2013-10-24 DIAGNOSIS — I498 Other specified cardiac arrhythmias: Secondary | ICD-10-CM

## 2013-10-24 DIAGNOSIS — M3130 Wegener's granulomatosis without renal involvement: Secondary | ICD-10-CM

## 2013-10-24 MED ORDER — AMOXICILLIN-POT CLAVULANATE 875-125 MG PO TABS: 1.0000 | ORAL_TABLET | Freq: Two times a day (BID) | ORAL | Status: DC

## 2013-10-24 MED ORDER — CYCLOPHOSPHAMIDE 50 MG PO TABS: 50.0000 mg | ORAL_TABLET | Freq: Every day | ORAL | Status: DC

## 2013-10-24 NOTE — Telephone Encounter (Signed)
Yes should be refilledx 3 m

## 2013-10-24 NOTE — Telephone Encounter (Signed)
Pt does not have appointment for x3 weeks and was placed on cytoxan 50mg  back in Jan. He only has enough until Monday, is this something you want him to continue? Please advise MW, thanks

## 2013-10-24 NOTE — Progress Notes (Signed)
Pre visit review using our clinic review tool, if applicable. No additional management support is needed unless otherwise documented below in the visit note. 

## 2013-10-24 NOTE — Progress Notes (Signed)
   Subjective:    Patient ID: Richard [name], male    DOB: [birth year], 78 y.o.   MRN: [id number]  HPI Here for one week of sinus pressure, PND, and coughing up yellow sputum. No fever. He was hospitalized from 10-04-13 to 10-07-13 for SVT but this has resolved. His Wegeners granulomatosis has flared up again and Dr. [name] has him on Cytoxan again. He also takes 10 mg of prednisone and one dose of Bactrim daily.    Review of Systems  Constitutional: Negative.   HENT: Positive for congestion, postnasal drip and sinus pressure.   Eyes: Negative.   Respiratory: Positive for cough and shortness of breath.   All other systems reviewed and are negative.       Objective:   Physical Exam  Constitutional: He appears well-developed and well-nourished.  HENT:  Right Ear: External ear normal.  Left Ear: External ear normal.  Nose: Nose normal.  Mouth/Throat: Oropharynx is clear and moist.  Eyes: Conjunctivae are normal.  Pulmonary/Chest: Effort normal. No respiratory distress. He has no rales.  Soft scattered rhonchi  Lymphadenopathy:    He has no cervical adenopathy.          Assessment & Plan:  Treat the sinusitis with Augmentin for 10 days. During this time hold the Bactrim, then resume this after the Augmentin is done.

## 2013-10-24 NOTE — Telephone Encounter (Signed)
Advised pt of rec's from [facility].  Sent to pharm, pt aware. Nothing further needed

## 2013-10-25 ENCOUNTER — Telehealth: Payer: Self-pay | Admitting: Family Medicine

## 2013-10-25 ENCOUNTER — Ambulatory Visit: Payer: Medicare Other | Admitting: Internal Medicine

## 2013-10-25 NOTE — Telephone Encounter (Signed)
Relevant patient education assigned to patient using Emmi. ° °

## 2013-11-07 ENCOUNTER — Other Ambulatory Visit (INDEPENDENT_AMBULATORY_CARE_PROVIDER_SITE_OTHER): Payer: Medicare Other

## 2013-11-07 ENCOUNTER — Ambulatory Visit (INDEPENDENT_AMBULATORY_CARE_PROVIDER_SITE_OTHER)
Admission: RE | Admit: 2013-11-07 | Discharge: 2013-11-07 | Disposition: A | Discharge status: Home / Self Care | Payer: Medicare Other | Source: Ambulatory Visit | Attending: Internal Medicine | Admitting: Internal Medicine

## 2013-11-07 ENCOUNTER — Encounter: Payer: Self-pay | Admitting: Internal Medicine

## 2013-11-07 ENCOUNTER — Ambulatory Visit (INDEPENDENT_AMBULATORY_CARE_PROVIDER_SITE_OTHER): Payer: Medicare Other | Admitting: Internal Medicine

## 2013-11-07 VITALS — BP 130/68 | HR 107 | Temp 98.3°F | Ht 68.0 in | Wt 128.0 lb

## 2013-11-07 DIAGNOSIS — M3130 Wegener's granulomatosis without renal involvement: Secondary | ICD-10-CM

## 2013-11-07 DIAGNOSIS — J310 Chronic rhinitis: Secondary | ICD-10-CM

## 2013-11-07 DIAGNOSIS — J479 Bronchiectasis, uncomplicated: Secondary | ICD-10-CM

## 2013-11-07 LAB — CBC WITH DIFFERENTIAL/PLATELET
Basophils Absolute: 0 10*3/uL (ref 0.0–0.1)
Basophils Relative: 0.3 % (ref 0.0–3.0)
Eosinophils Absolute: 0 10*3/uL (ref 0.0–0.7)
Eosinophils Relative: 0.2 % (ref 0.0–5.0)
HEMATOCRIT: 33.9 % — AB (ref 39.0–52.0)
Hemoglobin: 11 g/dL — ABNORMAL LOW (ref 13.0–17.0)
Lymphocytes Relative: 4.2 % — ABNORMAL LOW (ref 12.0–46.0)
Lymphs Abs: 0.4 10*3/uL — ABNORMAL LOW (ref 0.7–4.0)
MCHC: 32.6 g/dL (ref 30.0–36.0)
MCV: 93 fl (ref 78.0–100.0)
MONOS PCT: 5.2 % (ref 3.0–12.0)
Monocytes Absolute: 0.5 10*3/uL (ref 0.1–1.0)
Neutro Abs: 8.9 10*3/uL — ABNORMAL HIGH (ref 1.4–7.7)
Neutrophils Relative %: 90.1 % — ABNORMAL HIGH (ref 43.0–77.0)
PLATELETS: 477 10*3/uL — AB (ref 150.0–400.0)
RBC: 3.65 Mil/uL — ABNORMAL LOW (ref 4.22–5.81)
RDW: 18 % — ABNORMAL HIGH (ref 11.5–14.6)
WBC: 9.9 10*3/uL (ref 4.5–10.5)

## 2013-11-07 LAB — BASIC METABOLIC PANEL
BUN: 20 mg/dL (ref 6–23)
CO2: 25 mEq/L (ref 19–32)
Calcium: 8.6 mg/dL (ref 8.4–10.5)
Chloride: 103 mEq/L (ref 96–112)
Creatinine, Ser: 1.3 mg/dL (ref 0.4–1.5)
GFR: 57.65 mL/min — ABNORMAL LOW (ref >60.00)
Glucose, Bld: 108 mg/dL — ABNORMAL HIGH (ref 70–99)
POTASSIUM: 5.4 meq/L — AB (ref 3.5–5.1)
SODIUM: 137 meq/L (ref 135–145)

## 2013-11-07 LAB — SEDIMENTATION RATE: Sed Rate: 61 mm/hr — ABNORMAL HIGH (ref 0–22)

## 2013-11-07 NOTE — Patient Instructions (Addendum)
Please remember to go to the lab and x-ray department downstairs for your tests - we will call you with the results when they are available.  Try mucinex d as per bottle for nasal congestion  Please see patient coordinator before you leave today  to schedule full sinus CT  Remember to blow symbicort out thru nose  Ok to use tramadol at bedtime to see if helps sleep    Please schedule a follow up office visit in 4 weeks, sooner if needed

## 2013-11-07 NOTE — Progress Notes (Signed)
Subjective:    Patient ID: Richard [name], male    DOB: Feb 12, 1936     MRN: [id number]  Brief patient profile:  78  yowm never smoker with dx of Longstanding bronchiectasis then dx with WG 03/2010 > requested establish with Richard [name].   Admit [name] dx WG by pos c anca 03/2010, supportive tbbx but not vats   Admit [name] dx spont L Ptx 9/08/15/09 requiring Chest tube but no surgery   05/24/10--NP ov/ med reivew. Since last visit. has been hospitalized 05/12/2010-- 10/10/2011for Right pneumothorax., with underlying Wegener granulomatosis. and . Bronchiectasis. Pt was found to have Right Pneumo. Chest tube was placed. with improvement and removed 10/7. Xray on 10/10 w/ resolved pnuemo. He was continued on cytoxan and bactrim for PCP prophylaxis. rec. Prednisone 20 mg reduce to one half daily  Aspirin 81 mg one daily with breakfast but stop for any bleeding  See Patient Care Coordinator before leaving for cardiology appt > saw Richard [name], ? LHC needed     October 13, 2010 --Presents for an acute office visit. Complains of productive cough with yellow mucus, sore throat, rattling in chest, low grade temp x3days. Currently on pred 1/2 every other day . OTC not helping. rec continue qod pred, rx with augmentin x 10 days   11/16/2010 ov all smiles, no change on days of prednisone, minimal discolored sputum.   Try off prednisone completely.  If you feel a lot worse over the next  week(worse breathing/ coughing/ nausea or loss of appetite) resume prednisone 5 mg one half daily until better then return back one half every other day    12/16/2010 ov/Richard [name] cc no better on 5mg  dosed one half every other day (not the dose rec), still nauseated, still discolored p doxy complete May 7, sob in shower but not as much walking and no longer using 02, struggling with contingency plans. rec Work on inhaler technique:  If short of breath go ahead and use the xopenex hfa 2 puffs every 4 hours  Prednisone 5 mg 4 daily until better, then  2 daily x 5 days, then 1 daily thereafter   12/31/10 ov/Richard [name], exac of bronchiectasis/wheeze no hemoptysis rx with pred burst and levaquin 750 x 5d  01/13/2011 ov/Richard [name]  Cc sob and cough better now tapered prednisone to 5 mg each am using xop once daily around noon no problem with sleep or am exac of cough.   rec  02/11/2011 Richard [name] cc recurrent hemoptysis on top of yellow that usually comes up esp in am x one year but no epistaxis, no cp or arthralgias or increased doe. rec Levaquin 750 mg x 5 days courses whenever mucus turns bloody, more dark or with high fever. Stop cytoxan and sulfasoxazole  Don't take aspirin if any active bleeding   03/16/2011 f/u ov/Richard [name] cc no more blood,  Minimal yellow mucus, back on asa 81 without hemoptysis. No sob rec  Try prednisone 5 mg  One on even and a half on odd   Acute Visit 04/26/11 -Richard [name]  Cc  increased chest congestion, yellow sputum beginning about 1 week ago. He took a levaquin x 5 days (9/11 - 9/15). Has a sore throat, getting better. Nasal drainage and sinus drainage worse over last 3 -4 days. He tells me that he cut grass last Thursday, may have started this. No sick contacts.  rec Please start levaquin daily for another 5 days Continue your current prednisone dosing  Start loratadine 10mg  daily until your next  visit    05/27/2011 f/u ov/Richard [name] on 2.5 mg pred /day  cc still coughing, congested though much less purulent sputum p levaquin and no hemoptysis.  Does note fatigue > baseline  With doe worse x garbage cans to street and back and has not tried the xopenex at all. rec Try using xopenex as needed for short of breath Only take the loratidine (clariton) if needed for itching sneezing runny nose     07/19/2011 ER follow up 07/15/11 with increased cough, congestion and fever. Dx with acute bronchiectasis and possible influenza. He has started on Levaquin 750mg  x 5 days w/ last day today. Also given Tamiflu but did not fill this. Fevers are  decreasing. Cough and congesiton are improved but not gone. Still feels weak.CXR w/o acute changes.  Labs from ER reviewed with elevated WBC 11k, w/ left shift. Neg strep test. No influenza data found.  rec Extend Levaquin daily for 5 days  Mucinex DM Twice daily  As needed  Cough/congestion  Fluids and rest  Tylenol As needed  Fever   08/24/2011 f/u ov/Richard [name] on Prednisone 5 mg one half each am  cc cough/ congestion back to baseline, no limiting sob. rec Try prednisone 5 mg one half pill alternate days to see if any worse respiratory symptoms or nausea, fatigue get worse Richard to adjust the mucinex dm to take up to 2 every 12 hours as needed For itching sneezing and runny nose, use the loratidine 10 mg (clariton)   OV 09/28/2011 ACUTE OFFICE VISIT WITH Richard Richard [name] with flare cough  rec Please take Take prednisone 40mg  once daily x 3 days, then 30mg  once daily x 3 days, then 20mg  once daily x 3 days, then prednisone 10mg  once daily  x 3 days and then 5mg  daily x 3 days and then to your baseline regimen of alternating  11/28/2011 f/u ov/Richard [name] cc much better on higher doses of prednisone but worse w/in 5 days of floor of qod regimen with increase cough/ congestion minimally discolored sputum no epistaxis hemoptysis or aches/ pains x vague discomfort r ant chest under breast.  No hematuria rec Prednisone 20 mg per day until better, then 10 mg per day x 5 days, and then 5 mg per day.    05/31/2012 f/u ov/Richard [name] cc no change in cough, yellowish, thick, worse in ams,  not  Bloody- no epistaxis, rash, arthralgias or sob.   rec Try prednisone 5 mg  One on even and one-half  on odd - if no change then 2 weeks before your next scheduled pulmonary visit take one half every day   10/11/2012 f/u ov/Richard [name] still on one on even and half on odd prednisone(did not taper as per instructions, though no flare in symptoms), rare need for levaquin rec Prednisone 5 mg pill take one half daily  11/23/2012 f/u ov/Richard [name] re  WG/ bronchiectasis Chief Complaint  Patient presents with  . Follow-up    Had PFT today-sob same,cough-yellow,wheezing occass.,  no hemoptysis, no real limiting sob though sedentary, no arthralgias or rash maintaining 2.5 mg daily No change prednisone 5 mg one half daily  > Richard to try every other day if doing great May 1 flare rx levaquin and pred May 16 lap [facility]ole [facility]   02/19/2013 f/u ov/Richard [name] re WG and bronchietasis still on 5 mg one half daily  Chief Complaint  Patient presents with  . Follow-up    Pt c/o increased cough x 2 wks- prod with minimal yellow sputum. He also  has some increased SOB since increased cough started.   symptoms came on gradually with min proression and no change rx yet > has saba but not using.  Cough better with mucinex. No hemoptysis or epistaxis. Sob only with exertion rec When wheezing or coughing is worse, increase prednisone to 20 mg daily until better, then 10 mg day x 3 days and 5 mg per day x 3 days then 2.5 mg per day thereafter. mucinex dose is 600 take 2 every 12 hours as needed for cough/ congestion/ thick mucus   04/26/2013 f/u ov/Richard [name] re: WG/ bronchiectasis p completed 10 days of levaquin 750 on 9/14  Chief Complaint  Patient presents with  . Follow-up    Breathing better. cough w/ yellow tint phlem.    Mucus never Never bloody and much lighter p levaquin, no need for saba on symb 160 2bid - wife concerned levaquin resistance developing. rec factive one daily x 5 days next time mucus gets nasty> did not tolerate Prednisone ceiling is 10 mg per day and floor is 5 mg daily  Only use your xopenex as a rescue medication   07/26/2013 f/u ov/Richard [name] re: bronchiectasis / hj/o WG Chief Complaint  Patient presents with  . Follow-up    Breathing is unchanged since the last visit. Still has prod cough with moderate yellow sputum.  He also c/o chest tightness since he stoped levaquin approx 10 days ago.   Not using flutter or xopenex very much at all.  Maintaining floor of 5 mg daily  rec Cipro 750 twice daily x 10 days if you get nasty mucus Use the flutter valve and chest percussion    08/19/2013 f/u ov/Richard [name] re: ? Recurrent WG Chief Complaint  Patient presents with  . Follow-up    Breathing is unchanged. Cough unchanged-prod cpugh w/ yellow phlem. On 2nd dose of cipro  now on day 5/10 cipro and less dark, never bloody, no sinus complaints, arthritis rash or fever or hematuria. >ANCA 1:80 titer, started on cytoxan   09/06/2013 Acute OV  Complains of  weakness, decreased appetite, temp up to 101, wheezing, increased SOB, prod cough with small amounts of yellow mucus x 5days.  Began Cipro 750mg  09/03/13  Fever is better . Was started on cytoxan on 1/19 for suspected wegeners flare (w/ ANCA positive 1:80) .  Appetite is poor but weight is stable.  rec Finish Cipro 750 twice daily x 10 days as directed.  Use the flutter valve and chest percussion  Fluids and rest .  Tylenol As needed   Labs and xray >> no change     09/12/2013 f/u ov/Richard [name] re:  Chief Complaint  Patient presents with  . Follow-up    Pt c/o increased SOB, congestion and cough for the past 7-10 days. Cough is prod with minimal yellow sputum. Finished round of cipro on 09/11/13.     Changed pred to 10 mg daily on his own Not using flutter or tramadol  rec Continue prednisone at 10 mg daily for now For cough > mucinex dm up to 1200 mg every 12 hours and use the flutter valve - supplement with tramadol for severe or uncontrolled coughing Augmentin 875 mg take one pill twice daily  X 10 days - take at breakfast and supper with large glass of water.  It would help reduce the usual side effects (diarrhea and yeast infections) if you ate cultured yogurt at lunch.   09/27/2013 f/u ov/Richard [name] re:  WG on pred 10 and ctx  50 mg daily  Chief Complaint  Patient presents with  . Follow-up    Pt states cough and SOB seems worse since the last visit. Still producing yellow sputum. He  states has low grade temp every am.   assoc with nasal congestion but no epistaxis or hemoptysis  rec Stop Zebeta Schedule sinus CT> 2/09/14/13 Mild to moderate mucosal thickening in the paranasal sinuses without  air-fluid level. For cough use the mucinex dm up to 1200 mg every 12 hours and as much flutter valve as possible For breathing use xopenex up to 2 puffs every 4 hours if needed and don't leave home wihout it   Admit date: 10/04/2013  Discharge date: 10/07/2013  Discharge Diagnoses:  SOB (shortness of breath)  Wegener's granulomatosis  Bronchiectasis with acute exacerbation  SVT (supraventricular tachycardia)  Cough  Weakness generalized  Discharge Condition: Stable and improved  Filed Weights    10/04/13 1539   Weight:  58.423 kg (128 lb 12.8 oz)   History of present illness:  Patient is a pleasant 78 year old white man with a past medical history significant for Wegener's granulomatosis diagnosed in 2011 was on Cytoxan until 2013. He follows with Richard. Melvyn [name]. He is maintained on prednisone 10 mg daily. He had been feeling well until mid January at which point a chest x-ray showed increased nodules and Richard. Melvyn [name] decided to restart Cytoxan. Patient came into the hospital with the above-mentioned complaints. His heart rate was noted to be in the 180s in the rhythm of SVT. He converted to sinus rhythm spontaneously. CT scan of the chest shows bronchiectasis as well as numerous cavitary and non-cavitary nodules. We have been asked to admit him for further evaluation and management. In addition to his cough and mild shortness of breath, he endorses low-grade temperatures for about 2 weeks as well as increasing sinus symptoms.  Hospital Course:  Wegener's Granulomatosis/Bronchiectasis with Acute Exacerbation  -Cx data remains negative to date.  -As discussed with Richard. Lamonte [name], will transition antibiotics to cipro for 3 more days, continue cytoxan (bactrim while on cytoxan for PCP prophylaxis),  and slow steroid taper until seen again by Richard. Melvyn [name].  SVT  -Spontaneously converted to NSR.       10/15/2013 post hosp ov/Richard [name] re: flare of bronchiectasis  Chief Complaint  Patient presents with  . HFU    Breathing unchanged since hospital d/c. His cough is some better.   breathing when walk better but not back to baseline Strength a lot better, cough is a lot better slt yellow sputum Now on bactrim ds daily and cytoxan 50 mg one daily  pred 10 mg x 3 per day > floor of 10 mg  rec No change rx    11/07/2013 f/u ov/Richard [name] re:   WG/ new HA L Frontal x late Feb 2015 / pred 10 mg daily  Chief Complaint  Patient presents with  . Follow-up    Pt c/o SOB, sometimes prod cough with yellow mucous, worse in a.m., PND, sinus congestion, sneezing.    finished augmentin x 4 days prior to OV   xopenex avg daily around noon  Uses nasal  Saline variably bloody nasal discharge  No obvious day to day or daytime variabilty or cp or chest tightness, subjective wheeze overt   hb symptoms. No unusual exp hx or h/o childhood pna/ asthma or knowledge of premature birth.  Sleeping Richard without nocturnal  or early am exacerbation  of respiratory  c/o's or need for noct saba. Also  denies any obvious fluctuation of symptoms with weather or environmental changes or other aggravating or alleviating factors except as outlined above   Current Medications, Allergies, Complete Past Medical History, Past Surgical History, Family History, and Social History were reviewed in [employer] record.  ROS  The following are not active complaints unless bolded sore throat, dysphagia, dental problems, itching, sneezing,  nasal congestion or excess/ purulent secretions, ear ache,   fever, chills, sweats, unintended wt loss, pleuritic or exertional cp, hemoptysis,  orthopnea pnd or leg swelling, presyncope, palpitations, heartburn, abdominal pain, anorexia, nausea, vomiting, diarrhea  or change in bowel or  urinary habits, change in stools or urine, dysuria,hematuria,  rash, arthralgias, visual complaints, headache, numbness weakness or ataxia or problems with walking or coordination,  change in mood/affect or memory.               Objective:   Physical Exam  wt   128  04/26/11 >07/08/2011  134> 134 11/28/2011 > 02/29/2012 136> 05/31/2012  136>  134 11/22/12 > 07/26/2013  136 > 08/21/2013  136 >133 09/06/2013 >  09/12/2013  133 > 132 09/27/2013 > 10/15/2013 131 > 11/07/2013  128   amb thin pleasant wm nad  SKIN: no rash, lesions  NODES: no lymphadenopathy  HEENT: [place]/AT, EOM- WNL, Conjuctivae- clear, PERRLA, TM-WNL, Nose- clear, Throat- clear and wnl, Mallampati III  NECK: Supple w/ fair ROM, JVD- none, normal carotid impulses w/o bruits Thyroid-  CHEST: min bilateral exp rhonchi  No resp distress HEART: RRR, no m/g/r heard  ABDOMEN: Soft and nl EXT:  No deformities or restrucitons      CT 10/04/13 c/w WG/bronchiectasis Persistent unchanged bilateral cavitary pulmonary nodules consistent  with the patient's known diagnosis of Wegener's granulomatosis.   Lab Results  Component Value Date   ESRSEDRATE 61* 11/07/2013      Recent Labs Lab 11/07/13 1501  NA 137  K 5.4*  CL 103  CO2 25  BUN 20  CREATININE 1.3  GLUCOSE 108*    Recent Labs Lab 11/07/13 1501  HGB 11.0*  HCT 33.9*  WBC 9.9  PLT 477.0*               Assessment & Plan:

## 2013-11-09 NOTE — Assessment & Plan Note (Signed)
-   Sinus CT 10/04/13 > Mild to moderate mucosal thickening in the paranasal sinuses without air-fluid level.   Rec  Repeat Sinus CT rec due to L frontal HA in pt with WG and bloody nasal discharge, hold further abx for now

## 2013-11-09 NOTE — Assessment & Plan Note (Signed)
-   HFA 90% p coaching 12/16/2010      - PFT's 06/21/11   FEV1  1.60 (61%) ratio 48 and 10% better p B2,  DLCO 83%     - PFT's 11/22/2012 FEV1  1.52 (62%) arnd 46 and no change p B2 DLCO 89%   Adequate control on present rx, reviewed > no change in rx needed

## 2013-11-09 NOTE — Assessment & Plan Note (Signed)
-  Dx by TBBX/ pos anca 03/2010 > CTX started    - Off all prednisone with baseline esr 31 11/16/2010  > restarted 12/16/2010 with nausea and wt down to 115 but ok sats walking x 3 laps   - Recheck ANCA sent  12/16/2010  >  neg   -Trial off cytoxan starting 02/12/2011 >   03/16/11 stopped  - Prednisone ceiling is 10 mg per day and floor is 5 mg daily as of 04/26/13  - 08/19/13 ANCA POS 1:80 with increasing nodular dz > rx 08/21/2013 cytoxan 50 mg daily  - Started bactrim ds one daily 10/07/13     Lab Results  Component Value Date   ESRSEDRATE 61* 11/07/2013   ESRSEDRATE 20 07/26/2013   ESRSEDRATE 15 10/11/2012    Lab Results  Component Value Date   CREATININE 1.3 11/07/2013   CREATININE 1.15 10/07/2013   CREATININE 1.06 10/05/2013     Concerned about trending up on esr and renal fxn so repeat both in one month  - ESR up 11/07/13 rec double pred to 20 mg until feeling better then floor of 10 mg

## 2013-11-11 NOTE — Progress Notes (Signed)
Quick Note:  Called and spoke to pt regarding results. Pt verbalized understanding and denied any further questions. Pt is aware and understands the recs Dr. [name] made in regards to the ESR. ______ 

## 2013-11-11 NOTE — Progress Notes (Signed)
Quick Note:  Called and spoke to pt regarding results of CXR. Pt verbalized understanding and denied any further questions or concerns at this time. ______

## 2013-11-12 ENCOUNTER — Ambulatory Visit (INDEPENDENT_AMBULATORY_CARE_PROVIDER_SITE_OTHER)
Admission: RE | Admit: 2013-11-12 | Discharge: 2013-11-12 | Disposition: A | Discharge status: Home / Self Care | Payer: Medicare Other | Source: Ambulatory Visit | Attending: Internal Medicine | Admitting: Internal Medicine

## 2013-11-12 ENCOUNTER — Ambulatory Visit: Payer: Medicare Other | Admitting: Internal Medicine

## 2013-11-12 DIAGNOSIS — J310 Chronic rhinitis: Secondary | ICD-10-CM

## 2013-11-13 ENCOUNTER — Encounter: Payer: Self-pay | Admitting: Internal Medicine

## 2013-11-13 ENCOUNTER — Telehealth: Payer: Self-pay | Admitting: Family Medicine

## 2013-11-13 NOTE — Progress Notes (Signed)
Quick Note:  Spoke with pt and notified of results per Dr. [name]. Pt verbalized understanding and denied any questions.  ______ 

## 2013-11-13 NOTE — Telephone Encounter (Signed)
I spoke with pt and it sounds like sinus issues, can you call pt to schedule the visit for 11/18/13?

## 2013-11-13 NOTE — Telephone Encounter (Signed)
Pt would like to see Dr Sarajane Jews on Mon 11/18/13 can I use one of the SDA SLOTS

## 2013-11-14 ENCOUNTER — Other Ambulatory Visit: Payer: Medicare Other

## 2013-11-18 ENCOUNTER — Ambulatory Visit (INDEPENDENT_AMBULATORY_CARE_PROVIDER_SITE_OTHER): Payer: Medicare Other | Admitting: Family Medicine

## 2013-11-18 ENCOUNTER — Encounter: Payer: Self-pay | Admitting: Family Medicine

## 2013-11-18 VITALS — BP 130/60 | HR 116 | Temp 98.9°F | Ht 66.0 in | Wt 126.0 lb

## 2013-11-18 DIAGNOSIS — I10 Essential (primary) hypertension: Secondary | ICD-10-CM

## 2013-11-18 DIAGNOSIS — M3130 Wegener's granulomatosis without renal involvement: Secondary | ICD-10-CM

## 2013-11-18 DIAGNOSIS — H538 Other visual disturbances: Secondary | ICD-10-CM

## 2013-11-18 MED ORDER — SULFAMETHOXAZOLE-TMP DS 800-160 MG PO TABS: 1.0000 | ORAL_TABLET | Freq: Every day | ORAL | Status: DC

## 2013-11-18 NOTE — Progress Notes (Signed)
   Subjective:    Patient ID: Richard [name], male    DOB: 22-Oct-1935, 78 y.o.   MRN: [id number]  HPI Here for several issues. First he has felt a pressure in the left eye with some blurred vision for several weeks. His last eye exam was last Novemeber, and he is scheduled to see his eye doctor again tomorrow. He had a limited CT of his sinuses recently per Dr. [name] and this was clear. He takes bactrim daily as maintenance since he was started on Cytoxan.    Review of Systems  Constitutional: Negative.   HENT: Positive for sinus pressure. Negative for congestion, postnasal drip and rhinorrhea.   Eyes: Positive for photophobia and visual disturbance. Negative for pain, discharge, redness and itching.  Neurological: Negative.        Objective:   Physical Exam  Constitutional: He is oriented to person, place, and time. He appears well-developed and well-nourished. No distress.  HENT:  Head: Normocephalic and atraumatic.  Right Ear: External ear normal.  Left Ear: External ear normal.  Nose: Nose normal.  Mouth/Throat: Oropharynx is clear and moist.  Eyes: Conjunctivae and EOM are normal. Pupils are equal, round, and reactive to light.  Lymphadenopathy:    He has no cervical adenopathy.  Neurological: He is alert and oriented to person, place, and time. No cranial nerve deficit.          Assessment & Plan:  Refilled Bactrim. He has a poor appetite so I suggested he take 2-3 cans of Ensure daily. He will see the eye clinic tomorrow, and they will check for glaucoma, etc.

## 2013-11-18 NOTE — Progress Notes (Signed)
Pre visit review using our clinic review tool, if applicable. No additional management support is needed unless otherwise documented below in the visit note. 

## 2013-11-19 ENCOUNTER — Telehealth: Payer: Self-pay | Admitting: Family Medicine

## 2013-11-19 NOTE — Telephone Encounter (Signed)
Relevant patient education assigned to patient using Emmi. ° °

## 2013-12-06 ENCOUNTER — Encounter (HOSPITAL_COMMUNITY): Payer: Self-pay | Admitting: Emergency Medicine

## 2013-12-06 ENCOUNTER — Emergency Department (HOSPITAL_COMMUNITY)
Admission: EM | Admit: 2013-12-06 | Discharge: 2013-12-06 | Disposition: A | Discharge status: Home / Self Care | Payer: Medicare Other | Attending: Emergency Medicine | Admitting: Emergency Medicine

## 2013-12-06 ENCOUNTER — Ambulatory Visit: Payer: Medicare Other | Admitting: Internal Medicine

## 2013-12-06 ENCOUNTER — Emergency Department (HOSPITAL_COMMUNITY): Payer: Medicare Other

## 2013-12-06 DIAGNOSIS — N400 Enlarged prostate without lower urinary tract symptoms: Secondary | ICD-10-CM | POA: Insufficient documentation

## 2013-12-06 DIAGNOSIS — Z9981 Dependence on supplemental oxygen: Secondary | ICD-10-CM | POA: Insufficient documentation

## 2013-12-06 DIAGNOSIS — I951 Orthostatic hypotension: Secondary | ICD-10-CM | POA: Insufficient documentation

## 2013-12-06 DIAGNOSIS — J45901 Unspecified asthma with (acute) exacerbation: Secondary | ICD-10-CM | POA: Insufficient documentation

## 2013-12-06 DIAGNOSIS — Z8669 Personal history of other diseases of the nervous system and sense organs: Secondary | ICD-10-CM | POA: Insufficient documentation

## 2013-12-06 DIAGNOSIS — K219 Gastro-esophageal reflux disease without esophagitis: Secondary | ICD-10-CM | POA: Insufficient documentation

## 2013-12-06 DIAGNOSIS — Z860100 Personal history of colon polyps, unspecified: Secondary | ICD-10-CM | POA: Insufficient documentation

## 2013-12-06 DIAGNOSIS — Z9849 Cataract extraction status, unspecified eye: Secondary | ICD-10-CM | POA: Insufficient documentation

## 2013-12-06 DIAGNOSIS — Z872 Personal history of diseases of the skin and subcutaneous tissue: Secondary | ICD-10-CM | POA: Insufficient documentation

## 2013-12-06 DIAGNOSIS — M129 Arthropathy, unspecified: Secondary | ICD-10-CM | POA: Insufficient documentation

## 2013-12-06 DIAGNOSIS — IMO0002 Reserved for concepts with insufficient information to code with codable children: Secondary | ICD-10-CM | POA: Insufficient documentation

## 2013-12-06 DIAGNOSIS — Z792 Long term (current) use of antibiotics: Secondary | ICD-10-CM | POA: Insufficient documentation

## 2013-12-06 DIAGNOSIS — Z8619 Personal history of other infectious and parasitic diseases: Secondary | ICD-10-CM | POA: Insufficient documentation

## 2013-12-06 DIAGNOSIS — J471 Bronchiectasis with (acute) exacerbation: Secondary | ICD-10-CM | POA: Insufficient documentation

## 2013-12-06 DIAGNOSIS — Z862 Personal history of diseases of the blood and blood-forming organs and certain disorders involving the immune mechanism: Secondary | ICD-10-CM | POA: Insufficient documentation

## 2013-12-06 DIAGNOSIS — Z79899 Other long term (current) drug therapy: Secondary | ICD-10-CM | POA: Insufficient documentation

## 2013-12-06 DIAGNOSIS — Z7982 Long term (current) use of aspirin: Secondary | ICD-10-CM | POA: Insufficient documentation

## 2013-12-06 LAB — COMPREHENSIVE METABOLIC PANEL
ALT: 13 U/L (ref 0–53)
AST: 14 U/L (ref 0–37)
Albumin: 2.4 g/dL — ABNORMAL LOW (ref 3.5–5.2)
Alkaline Phosphatase: 66 U/L (ref 39–117)
BILIRUBIN TOTAL: 0.3 mg/dL (ref 0.3–1.2)
BUN: 21 mg/dL (ref 6–23)
CO2: 23 mEq/L (ref 19–32)
Calcium: 8.8 mg/dL (ref 8.4–10.5)
Chloride: 103 mEq/L (ref 96–112)
Creatinine, Ser: 1.37 mg/dL — ABNORMAL HIGH (ref 0.50–1.35)
GFR calc Af Amer: 57 mL/min — ABNORMAL LOW (ref >90)
GFR calc non Af Amer: 49 mL/min — ABNORMAL LOW (ref >90)
Glucose, Bld: 144 mg/dL — ABNORMAL HIGH (ref 70–99)
POTASSIUM: 4 meq/L (ref 3.7–5.3)
Sodium: 140 mEq/L (ref 137–147)
Total Protein: 5.6 g/dL — ABNORMAL LOW (ref 6.0–8.3)

## 2013-12-06 LAB — CBC WITH DIFFERENTIAL/PLATELET
Basophils Absolute: 0 10*3/uL (ref 0.0–0.1)
Basophils Relative: 0 % (ref 0–1)
EOS ABS: 0.1 10*3/uL (ref 0.0–0.7)
Eosinophils Relative: 2 % (ref 0–5)
HCT: 31.8 % — ABNORMAL LOW (ref 39.0–52.0)
Hemoglobin: 10 g/dL — ABNORMAL LOW (ref 13.0–17.0)
Lymphocytes Relative: 4 % — ABNORMAL LOW (ref 12–46)
Lymphs Abs: 0.4 10*3/uL — ABNORMAL LOW (ref 0.7–4.0)
MCH: 30.6 pg (ref 26.0–34.0)
MCHC: 31.4 g/dL (ref 30.0–36.0)
MCV: 97.2 fL (ref 78.0–100.0)
MONOS PCT: 9 % (ref 3–12)
Monocytes Absolute: 0.9 10*3/uL (ref 0.1–1.0)
Neutro Abs: 8.1 10*3/uL — ABNORMAL HIGH (ref 1.7–7.7)
Neutrophils Relative %: 85 % — ABNORMAL HIGH (ref 43–77)
Platelets: 369 10*3/uL (ref 150–400)
RBC: 3.27 MIL/uL — ABNORMAL LOW (ref 4.22–5.81)
RDW: 16.9 % — ABNORMAL HIGH (ref 11.5–15.5)
WBC: 9.5 10*3/uL (ref 4.0–10.5)

## 2013-12-06 LAB — SEDIMENTATION RATE: Sed Rate: 43 mm/hr — ABNORMAL HIGH (ref 0–16)

## 2013-12-06 LAB — TROPONIN I: Troponin I: <0.3 ng/mL (ref <0.30)

## 2013-12-06 MED ORDER — SODIUM CHLORIDE 0.9 % IV BOLUS (SEPSIS)
500.0000 mL | Freq: Once | INTRAVENOUS | Status: AC
  Administered 2013-12-06: 500 mL via INTRAVENOUS

## 2013-12-06 MED ORDER — LEVOFLOXACIN 500 MG PO TABS: 500.0000 mg | ORAL_TABLET | Freq: Every day | ORAL | Status: DC

## 2013-12-06 NOTE — ED Notes (Signed)
Pt reports weakness since January when he began treatment for lung disease. Pt reports several episodes of dizziness and sob. Wife states pt looked very pale during episodes. Pt states episodes lasted approximately 10 minutes, no pain or LOC.

## 2013-12-06 NOTE — ED Provider Notes (Signed)
CSN: [id number]     Arrival date & time 12/06/13  1105 History   First MD Initiated Contact with Patient 12/06/13 1122     Chief Complaint  Patient presents with  . Shortness of Breath  . Dizziness     (Consider location/radiation/quality/duration/timing/severity/associated sxs/prior Treatment) Patient is a 78 y.o. male presenting with shortness of breath and dizziness. The history is provided by the patient.  Shortness of Breath Severity:  Moderate Onset quality:  Gradual Associated symptoms: cough   Associated symptoms: no abdominal pain, no chest pain, no headaches, no rash and no vomiting   Dizziness Associated symptoms: shortness of breath   Associated symptoms: no chest pain, no diarrhea, no headaches, no nausea and no vomiting    patient had a couple episodes of lightheadedness. He felt as if he is in a pass out. No chest pain. He had some increasing trouble breathing also. He states it trouble breathing has come on with exertion. No fevers. He has had some increased production with his cough. He was going to see his pulmonologist to 1:30 today, but had to come here instead. Patient states he is felt dizzy when he stands up also.  Past Medical History  Diagnosis Date  . Allergy   . GERD (gastroesophageal reflux disease)   . Arthritis   . Asthma   . Fungal infection     lungs  . Cataract   . Ulcer   . BPH (benign prostatic hyperplasia)   . Wegener's granulomatosis 2011    sees Dr. [name]   . Peptic stricture of esophagus   . Anemia   . Hiatal hernia   . Adenomatous colon polyp 08/2011  . Bronchiectasis   . On home oxygen therapy 12-18-12    uses 2 l/m nasally at bedtime  . HOH (hard of hearing) 12-18-12    bilaterally  . HBP (high blood pressure) 09/28/2013   Past Surgical History  Procedure Laterality Date  . Hernia repair    . Eye surgery      cataracts removed.   . Colonoscopy  08-16-11    per Dr. Fuller Plan, diverticulosis and polyps, repeat in 5 yrs   .  Esophagogastroduodenoscopy (egd) with esophageal dilation  11-29-10    per Dr. Fuller Plan   . Cataract extraction, bilateral  12-18-12    bilateral  . Cholecystectomy N/A 12/21/2012    Procedure: LAPAROSCOPIC CHOLECYSTECTOMY WITH INTRAOPERATIVE CHOLANGIOGRAM;  Surgeon: Edward Jolly, MD;  Location: [facility];  Service: General;  Laterality: N/A;   Family History  Problem Relation Age of Onset  . Asthma Father   . Coronary artery disease Brother    History  Substance Use Topics  . Smoking status: Never Smoker   . Smokeless tobacco: Never Used  . Alcohol Use: No    Review of Systems  Constitutional: Negative for activity change and appetite change.  Eyes: Negative for pain.  Respiratory: Positive for cough and shortness of breath. Negative for chest tightness.   Cardiovascular: Negative for chest pain and leg swelling.  Gastrointestinal: Negative for nausea, vomiting, abdominal pain and diarrhea.  Genitourinary: Negative for flank pain.  Musculoskeletal: Negative for back pain and neck stiffness.  Skin: Negative for rash.  Neurological: Positive for dizziness. Negative for syncope, weakness, numbness and headaches.  Psychiatric/Behavioral: Negative for behavioral problems.      Allergies  Factive  Home Medications   Prior to Admission medications   Medication Sig Start Date End Date Taking? Authorizing Provider  acetaminophen (TYLENOL) 325 MG  tablet Take 650 mg by mouth every 6 (six) hours as needed for mild pain.   Yes Historical Provider, MD  aspirin 81 MG tablet Take 81 mg by mouth every evening.    Yes Historical Provider, MD  b complex vitamins tablet Take 1 tablet by mouth daily.     Yes Historical Provider, MD  bisacodyl (DULCOLAX) 5 MG EC tablet Take 5 mg by mouth daily as needed. CONSTIPATION   Yes Historical Provider, MD  budesonide-formoterol (SYMBICORT) 160-4.5 MCG/ACT inhaler Inhale 2 puffs into the lungs 2 (two) times daily.   Yes Historical Provider, MD   ciprofloxacin (CIPRO) 750 MG tablet Take 750 mg by mouth 2 (two) times daily. Taking cipro x 7 days 12/03/13  Yes Historical Provider, MD  cyclophosphamide (CYTOXAN) 50 MG tablet Take 1 tablet (50 mg total) by mouth daily. Give on an empty stomach 1 hour before or 2 hours after meals. 10/24/13  Yes Tanda Rockers, MD  dextromethorphan (DELSYM) 30 MG/5ML liquid Take 30 mg by mouth 2 (two) times daily as needed for cough.   Yes Historical Provider, MD  dextromethorphan-guaiFENesin (MUCINEX DM) 30-600 MG per 12 hr tablet Take 2 tablets by mouth 2 (two) times daily.    Yes Historical Provider, MD  dutasteride (AVODART) 0.5 MG capsule Take 0.5 mg by mouth every other day. In the evening.   Yes Historical Provider, MD  esomeprazole (NEXIUM) 20 MG capsule Take 20 mg by mouth daily.   Yes Historical Provider, MD  levalbuterol [facility] HFA) 45 MCG/ACT inhaler Inhale 1-2 puffs into the lungs every 4 (four) hours as needed for wheezing. 04/26/13  Yes Tanda Rockers, MD  loratadine (CLARITIN) 10 MG tablet Take 10 mg by mouth daily.    Yes Historical Provider, MD  methylcellulose (CITRUCEL) oral powder Take 1 packet by mouth daily.   Yes Historical Provider, MD  predniSONE (DELTASONE) 10 MG tablet Take 10 mg by mouth daily with breakfast.   Yes Historical Provider, MD  Simethicone (GAS-X PO) Take 2 tablets by mouth 2 (two) times daily.    Yes Historical Provider, MD  sulfamethoxazole-trimethoprim (BACTRIM DS) 800-160 MG per tablet Take 1 tablet by mouth daily. 11/18/13  Yes [name], MD  tamsulosin (FLOMAX) 0.4 MG CAPS capsule Take 0.4 mg by mouth every evening.   Yes Historical Provider, MD  traMADol (ULTRAM) 50 MG tablet 1-2 every 4 hours as needed for cough or pain 09/12/13  Yes Tanda Rockers, MD  levofloxacin (LEVAQUIN) 500 MG tablet Take 1 tablet (500 mg total) by mouth daily. 12/06/13   Jasper Riling. Yon Schiffman, MD   BP 123/69  Pulse 81  Temp(Src) 98.7 F (37.1 C) (Oral)  Resp 18  SpO2 98% Physical Exam   Nursing note and vitals reviewed. Constitutional: He is oriented to person, place, and time. He appears well-developed and well-nourished.  HENT:  Head: Normocephalic and atraumatic.  Eyes: EOM are normal. Pupils are equal, round, and reactive to light.  Neck: Normal range of motion. Neck supple.  Cardiovascular: Normal rate, regular rhythm and normal heart sounds.   No murmur heard. Pulmonary/Chest:  Mildly harsh diffuse breath sounds. Worse on left.  Abdominal: Soft. Bowel sounds are normal. He exhibits no distension and no mass. There is no tenderness. There is no rebound and no guarding.  Musculoskeletal: Normal range of motion. He exhibits no edema.  Neurological: He is alert and oriented to person, place, and time. No cranial nerve deficit.  Skin: Skin is warm and  dry.  Psychiatric: He has a normal mood and affect.    ED Course  Procedures (including critical care time) Labs Review Labs Reviewed  CBC WITH DIFFERENTIAL - Abnormal; Notable for the following:    RBC 3.27 (*)    Hemoglobin 10.0 (*)    HCT 31.8 (*)    RDW 16.9 (*)    Neutrophils Relative % 85 (*)    Neutro Abs 8.1 (*)    Lymphocytes Relative 4 (*)    Lymphs Abs 0.4 (*)    All other components within normal limits  COMPREHENSIVE METABOLIC PANEL - Abnormal; Notable for the following:    Glucose, Bld 144 (*)    Creatinine, Ser 1.37 (*)    Total Protein 5.6 (*)    Albumin 2.4 (*)    GFR calc non Af Amer 49 (*)    GFR calc Af Amer 57 (*)    All other components within normal limits  SEDIMENTATION RATE - Abnormal; Notable for the following:    Sed Rate 43 (*)    All other components within normal limits  TROPONIN I    Imaging Review Dg Chest 2 View  12/06/2013   CLINICAL DATA:  Cough and congestion, shortness of breath for 5 months, fungal infection of lungs  EXAM: CHEST  2 VIEW  COMPARISON:  DG CHEST 2 VIEW dated 11/07/2013; CT ANGIO CHEST W/CM &/OR WO/CM dated 10/04/2013  FINDINGS: Mild cardiac enlargement.  Vascular pattern normal. Moderate hiatal hernia.  There is moderate bilateral interstitial change with multifocal nodularity. When compared to the most recent prior study of 11/07/2013, the process is similar above mildly worse diffusely.  IMPRESSION: Mildly worse bilateral interstitial change with associated multifocal nodularity when compared to November 07, 2013. This may reflect finding of patient's known Wegner's granulomatosis. Concurrent atypical fungal infection is not excluded.   Electronically Signed   By: Skipper Cliche M.D.   On: 12/06/2013 13:00     EKG Interpretation   Date/Time:  Friday Dec 06 2013 11:14:18 EDT Ventricular Rate:  106 PR Interval:  152 QRS Duration: 82 QT Interval:  346 QTC Calculation: 459 R Axis:   -12 Text Interpretation:  Sinus tachycardia with occasional Premature  ventricular complexes Possible Inferior infarct , age undetermined  Anterior infarct , age undetermined Abnormal ECG Confirmed by Alvino Chapel   MD, Mareli Antunes [phone]) on 12/06/2013 3:58:09 PM      MDM   Final diagnoses:  Orthostasis  Bronchiectasis with acute exacerbation    *Patient with shortness of breath and near syncope. Also his been orthostatic. X-ray shows worsening interstitial change. Patient was orthostatic, but feels better after IV fluids. Discussed with   in his pulmonologist, Dr. [name]. Will give antibiotics and patient will followup as an outpatient.   Jasper Riling. Alvino Chapel, MD 12/06/13 1558

## 2013-12-06 NOTE — Discharge Instructions (Signed)
Bronchiectasis °Bronchiectasis is a condition in which the airways (bronchi) are damaged and widened. This makes it difficult for the lungs to get rid of mucus. As a result, mucus gathers in the airways, and this often leads to lung infections. Infection can cause inflammation in the airways, which may further weaken and damage the bronchi.  °CAUSES  °Bronchiectasis may be present at birth (congenital) or may develop later in life. Sometimes there is no apparent cause. Some common causes include: °· Cystic fibrosis.   °· Recurrent lung infections (such as pneumonia, tuberculosis, or fungal infections). °· Foreign bodies or other blockages in the lungs. °· Breathing in fluid, food, or other foreign objects (aspiration). °SIGNS AND SYMPTOMS  °Common symptoms include: °· A daily cough that brings up mucus and lasts for more than 3 weeks. °· Frequent lung infections (such as pneumonia, tuberculosis, or fungal infections). °· Shortness of breath and wheezing.   °· Weakness and fatigue. °DIAGNOSIS  °Various tests may be done to help diagnose bronchiectasis. Tests may include: °· Chest X-rays or CT scans.   °· Breathing tests to help determine how your lungs are working.   °· Sputum cultures to check for infection.   °· Blood tests and other tests to check for related diseases or causes, such as cystic fibrosis. °TREATMENT  °Treatment varies depending on the severity of the condition. Medicines may be given to loosen the mucus to be coughed up (expectorants), to relax the muscles of the air passages (bronchodilators), or to prevent or treat infections (antibiotics). Physical therapy methods may be recommended to help clear mucus from the lungs. For severe cases, surgery may be done to remove the affected part of the lung.  °HOME CARE INSTRUCTIONS  °· Get plenty of rest.   °· Only take over-the-counter or prescription medicines as directed by your health care provider. If antibiotics were prescribed, take them as directed.  Finish them even if you start to feel better. °· Avoid sedatives and antihistamines unless otherwise directed by your health care provider. These medicines tend to thicken the mucus in the lungs.   °· Perform any breathing exercises or techniques to clear the lungs as directed by your health care provider.  °· Drink enough fluids to keep your urine clear or pale yellow. °· Consider using a cold steam vaporizer or humidifier in your room or home to help loosen secretions.   °· If the cough is worse at night, try sleeping in a semi-upright position in a recliner or by using a couple pillows.   °· Avoid cigarette smoke and lung irritants. If you smoke, quit. °· Stay inside when pollution and ozone levels are high.   °· Stay current with vaccinations and immunizations.   °· Follow up with your health care provider as directed.   °SEEK MEDICAL CARE IF: °· You cough up more thick, discolored mucus (sputum) that is yellow to green in color. °· You have a fever or persistent symptoms for more than 2 3 days. °· You cannot control your cough and are losing sleep. °SEEK IMMEDIATE MEDICAL CARE IF:  °· You cough up blood.   °· You have chest pain or increasing shortness of breath.   °· You have pain that is getting worse or is uncontrolled with medicines.   °· You have a fever and your symptoms suddenly get worse.  °MAKE SURE YOU: °· Understand these instructions.   °· Will watch your condition.   °· Will get help right away if you are not doing well or get worse.   °Document Released: 05/22/2007 Document Revised: 03/27/2013 Document Reviewed: 01/30/2013 °[employer]® Patient Information ©  2014 [place], [place].  Orthostatic Hypotension Orthostatic hypotension is a sudden fall in blood pressure. It occurs when a person goes from a sitting or lying position to a standing position. CAUSES   Loss of body fluids (dehydration).  Medicines that lower blood pressure.  Sudden changes in posture, such as sudden standing when you have  been sitting or lying down.  Taking too much of your medicine. SYMPTOMS   Lightheadedness or dizziness.  Fainting or near-fainting.  A fast heart rate (tachycardia).  Weakness.  Feeling tired (fatigue). DIAGNOSIS  Your caregiver may find the cause of orthostatic hypotension through:  A history and/or physical exam.  Checking your blood pressure. Your caregiver will check your blood pressure when you are:  Lying down.  Sitting.  Standing.  Tilt table testing. In this test, you are placed on a table that goes from a lying position to a standing position. You will be strapped to the table. This test helps to monitor your blood pressure and heart rate when you are in different positions. TREATMENT   If orthostatic hypotension is caused by your medicines, your caregiver will need to adjust your dosage. Do not stop or adjust your medicine on your own.  When changing positions, make these changes slowly. This allows your body to adjust to the different position.  Compression stockings that are worn on your lower legs may be helpful.  Your caregiver may have you consume extra salt. Do not add extra salt to your diet unless directed by your caregiver.  Eat frequent, small meals. Avoid sudden standing after eating.  Avoid hot showers or excessive heat.  Your caregiver may give you fluids through the vein (intravenous).  Your caregiver may put you on medicine to help enhance fluid retention. SEEK IMMEDIATE MEDICAL CARE IF:   You faint or have a near-fainting episode. Call your local emergency services (911 in [place]  You have or develop chest pain.  You feel sick to your stomach (nauseous) or vomit.  You have a loss of feeling or movement in your arms or legs.  You have difficulty talking, slurred speech, or you are unable to talk.  You have difficulty thinking or have confused thinking. MAKE SURE YOU:   Understand these instructions.  Will watch your  condition.  Will get help right away if you are not doing well or get worse. Document Released: 07/15/2002 Document Revised: 10/17/2011 Document Reviewed: 11/07/2008 [facility] Patient Information 2014 [place], [place].

## 2013-12-11 ENCOUNTER — Telehealth: Payer: Self-pay | Admitting: Gastroenterology

## 2013-12-11 NOTE — Telephone Encounter (Signed)
Patient with abdominal pain, weight loss and GERD. Reports 8 lb weight loss for the last 2 months.  He will come in and see Richard [name] RNP tomorrow at 10:00

## 2013-12-12 ENCOUNTER — Encounter: Payer: Self-pay | Admitting: Nurse Practitioner

## 2013-12-12 ENCOUNTER — Ambulatory Visit (INDEPENDENT_AMBULATORY_CARE_PROVIDER_SITE_OTHER): Payer: Medicare Other | Admitting: Nurse Practitioner

## 2013-12-12 VITALS — BP 122/72 | HR 82 | Ht 66.0 in | Wt 126.2 lb

## 2013-12-12 DIAGNOSIS — R109 Unspecified abdominal pain: Secondary | ICD-10-CM

## 2013-12-12 DIAGNOSIS — R634 Abnormal weight loss: Secondary | ICD-10-CM | POA: Insufficient documentation

## 2013-12-12 NOTE — Progress Notes (Signed)
Reviewed and agree with initial management plan.  Braley Luckenbaugh T. Katai Marsico, MD FACG 

## 2013-12-12 NOTE — Progress Notes (Signed)
     History of Present Illness:  Patient is a 78 year old male known to Dr. Fuller Plan. He has a history of esophageal strictures requiring dilation, last one 2012. He also has a history of adenomatous colon polyps, is up-to-date on surveillance colonoscopies.   Patient chronic lung problems and in January was restarted on Cytoxan for Wegener's granulomatosis. He also takes daily Bactrim. Patient made this appointment today out of concern for weight loss. He has a poor appetite and gets full easily. Has to force food in morning, appetite decent in afternoon. He has lost 8 pounds. He does complain of nausea but no vomiting.  No GERD or dysphagia. Over the last month patient has noticed some postprandial abdominal discomfort. This normally occurs after heaviest meal of day. Discomfort lasts maybe 5 minutes and is predominantly lower abdominal. BMs are "okay". Patient feels decreased appetite and weight loss started with initiation of cytoxan in January. He is drinking Ensure.   Current Medications, Allergies, Past Medical History, Past Surgical History, Family History and Social History were reviewed in [employer] record.  Physical Exam: General: Pleasant, well developed , white male in no acute distress Head: Normocephalic and atraumatic Eyes:  sclerae anicteric, conjunctiva pink  Ears: Normal auditory acuity Lungs: Clear throughout to auscultation Heart: Regular rate and rhythm Abdomen: Soft, non distended, non-tender. No masses, no hepatomegaly. Normal bowel sounds Musculoskeletal: Symmetrical with no gross deformities  Extremities: No edema  Neurological: Alert oriented x 4, grossly nonfocal Psychological:  Alert and cooperative. Normal mood and affect  Assessment and Recommendations:  78 year old male with 10 pound weight loss since January.  Patient is convinced that weight loss is related to Cytoxan which was started in January. In addition to Cytoxan patient is on  several meds including chronic antibiotics. He is really worried about the weight loss. Medications could be contributing factor.   I think EGD would be of low yield.   Patient is up to date on colonoscopy ( last one just 2 years ago).   We discussed CTscan of abd/pelvis for evaluation of weight loss. Last creatinine a little elevated. He will hydrate himself over weekend then get BMET Monday followed by CTscan later next week. Will call him with results, if negative then his home medications may need to be reevaluated.   2. Chronic intermittent nausea  3. One month history of lower abdominal cramping after heaviest meal of day. Patient initially denied abdominal pain. He describes this as mild. Bowel movements at baseline. Doubt related to #1 but will keep in mind.

## 2013-12-12 NOTE — Patient Instructions (Addendum)
Your physician has requested that you go to the basement for the following lab work on Monday: BMET Lab is open 730 am to 530 pm  Please drink plenty of liquids to rehydrate yourself starting today   You have been scheduled for a CT scan of the abdomen and pelvis at [facility] ([address]---this is in the same building as [facility]).   You are scheduled on 12-18-2013 at 1030 am. You should arrive 15 minutes prior to your appointment time for registration. Please follow the written instructions below on the day of your exam:  WARNING: IF YOU ARE ALLERGIC TO IODINE/X-RAY DYE, PLEASE NOTIFY RADIOLOGY IMMEDIATELY AT [phone]! YOU WILL BE GIVEN A 13 HOUR PREMEDICATION PREP.  1) Do not eat or drink anything after 630 am (4 hours prior to your test) 2) You have been given 2 bottles of oral contrast to drink. The solution may taste better if refrigerated, but do NOT add ice or any other liquid to this solution. Shake well before drinking.    Drink 1 bottle of contrast @ 830 am (2 hours prior to your exam)  Drink 1 bottle of contrast @ 930 am (1 hour prior to your exam)  You may take any medications as prescribed with a small amount of water except for the following: Metformin, Glucophage, Glucovance, Avandamet, Riomet, Fortamet, Actoplus Met, Janumet, Glumetza or Metaglip. The above medications must be held the day of the exam AND 48 hours after the exam.  The purpose of you drinking the oral contrast is to aid in the visualization of your intestinal tract. The contrast solution may cause some diarrhea. Before your exam is started, you will be given a small amount of fluid to drink. Depending on your individual set of symptoms, you may also receive an intravenous injection of x-ray contrast/dye. Plan on being at [facility] for 30 minutes or long, depending on the type of exam you are having performed.  This test typically takes 30-45 minutes to complete.  If  you have any questions regarding your exam or if you need to reschedule, you may call the CT department at [phone] between the hours of 8:00 am and 5:00 pm, Monday-Friday.  ________________________________________________________________________

## 2013-12-16 ENCOUNTER — Other Ambulatory Visit (INDEPENDENT_AMBULATORY_CARE_PROVIDER_SITE_OTHER): Payer: Medicare Other

## 2013-12-16 DIAGNOSIS — R109 Unspecified abdominal pain: Secondary | ICD-10-CM

## 2013-12-16 DIAGNOSIS — R634 Abnormal weight loss: Secondary | ICD-10-CM

## 2013-12-16 LAB — BASIC METABOLIC PANEL
BUN: 19 mg/dL (ref 6–23)
CHLORIDE: 104 meq/L (ref 96–112)
CO2: 25 meq/L (ref 19–32)
CREATININE: 1.5 mg/dL (ref 0.4–1.5)
Calcium: 8.9 mg/dL (ref 8.4–10.5)
GFR: 49.18 mL/min — ABNORMAL LOW (ref >60.00)
GLUCOSE: 109 mg/dL — AB (ref 70–99)
Potassium: 4.2 mEq/L (ref 3.5–5.1)
Sodium: 138 mEq/L (ref 135–145)

## 2013-12-18 ENCOUNTER — Ambulatory Visit (INDEPENDENT_AMBULATORY_CARE_PROVIDER_SITE_OTHER)
Admission: RE | Admit: 2013-12-18 | Discharge: 2013-12-18 | Disposition: A | Discharge status: Home / Self Care | Payer: Medicare Other | Source: Ambulatory Visit | Attending: Nurse Practitioner | Admitting: Nurse Practitioner

## 2013-12-18 DIAGNOSIS — R109 Unspecified abdominal pain: Secondary | ICD-10-CM

## 2013-12-18 DIAGNOSIS — R634 Abnormal weight loss: Secondary | ICD-10-CM

## 2013-12-18 MED ORDER — IOHEXOL 300 MG/ML  SOLN
80.0000 mL | Freq: Once | INTRAMUSCULAR | Status: AC | PRN
  Administered 2013-12-18: 80 mL via INTRAVENOUS

## 2013-12-20 ENCOUNTER — Telehealth: Payer: Self-pay | Admitting: Nurse Practitioner

## 2013-12-20 NOTE — Telephone Encounter (Signed)
Calling about CT results. Please, advise.

## 2013-12-20 NOTE — Telephone Encounter (Signed)
I spoke with pt and if someone can give him a call Monday morning to get him scheduled to see Dr. Sarajane Jews the early part of next week.

## 2013-12-20 NOTE — Telephone Encounter (Signed)
Please call him to set up an OV with me early next week to discuss his recent CT results, I will do a prostate exam and get a PSA level

## 2013-12-23 ENCOUNTER — Ambulatory Visit (INDEPENDENT_AMBULATORY_CARE_PROVIDER_SITE_OTHER): Payer: Medicare Other | Admitting: Family Medicine

## 2013-12-23 ENCOUNTER — Encounter: Payer: Self-pay | Admitting: Family Medicine

## 2013-12-23 ENCOUNTER — Telehealth: Payer: Self-pay | Admitting: Nurse Practitioner

## 2013-12-23 VITALS — BP 136/73 | HR 103 | Temp 99.0°F | Ht 66.0 in | Wt 127.0 lb

## 2013-12-23 DIAGNOSIS — N139 Obstructive and reflux uropathy, unspecified: Secondary | ICD-10-CM

## 2013-12-23 DIAGNOSIS — N401 Enlarged prostate with lower urinary tract symptoms: Secondary | ICD-10-CM

## 2013-12-23 DIAGNOSIS — R972 Elevated prostate specific antigen [PSA]: Secondary | ICD-10-CM

## 2013-12-23 DIAGNOSIS — R634 Abnormal weight loss: Secondary | ICD-10-CM

## 2013-12-23 DIAGNOSIS — I7090 Unspecified atherosclerosis: Secondary | ICD-10-CM

## 2013-12-23 DIAGNOSIS — N138 Other obstructive and reflux uropathy: Secondary | ICD-10-CM

## 2013-12-23 LAB — PSA: PSA: 22.29 ng/mL — ABNORMAL HIGH (ref 0.10–4.00)

## 2013-12-23 NOTE — Telephone Encounter (Signed)
Pt states he got confused about who had called and will see dr fry today.

## 2013-12-23 NOTE — Telephone Encounter (Signed)
Spoke with patient and told him Dr. Fuller Plan wants him to see PCP since none of the issues on CT were GI related.

## 2013-12-23 NOTE — Telephone Encounter (Signed)
Pt states dr stark called him Friday and is going to get him in there this week. Pt prefers to wait to see him. Will call if he runs into any issues.

## 2013-12-23 NOTE — Progress Notes (Signed)
   Subjective:    Patient ID: Richard [name], male    DOB: 05/12/1936, 78 y.o.   MRN: [id number]  HPI Here to review some abnormalities seen on a recent CT of the abdomen and pelvis ordered by the GI office. He was being worked up for unexplained weight loss. The scan showed diffuse atherosclerosis in the abdominal vessels, it showed enlarged prostate, and several sclerotic lesions in the skeleton. We asked him to come in to talk about this. He has not had a prostate exam or a PSA for about 8 years, buy his estimate. He had been seeing Dr. Risa Grill up until that time.    Review of Systems  Constitutional: Positive for unexpected weight change. Negative for appetite change.  Gastrointestinal: Negative.   Genitourinary: Positive for difficulty urinating. Negative for dysuria, frequency and hematuria.       Objective:   Physical Exam  Constitutional: He appears well-developed and well-nourished.  Abdominal: Soft. Bowel sounds are normal. He exhibits no distension and no mass. There is no tenderness. There is no rebound and no guarding.  Genitourinary: Rectum normal.  Testicles normal . Prostate is quite enlarged and heterogeneous. Not hard or tender           Assessment & Plan:  With the CT results, we are worried about the possibility of prostate cancer so we will order a PSA. After that I would anticipate referring him back to Dr. Risa Grill. Also we will refer to Vascular Surgery to evaluate his abdominal vessels.

## 2013-12-23 NOTE — Progress Notes (Signed)
Pre visit review using our clinic review tool, if applicable. No additional management support is needed unless otherwise documented below in the visit note. 

## 2013-12-24 NOTE — Addendum Note (Signed)
Addended by: Alysia Penna A on: 12/24/2013 05:03 AM   Modules accepted: Orders

## 2014-01-09 ENCOUNTER — Encounter: Payer: Self-pay | Admitting: Internal Medicine

## 2014-01-09 ENCOUNTER — Ambulatory Visit (INDEPENDENT_AMBULATORY_CARE_PROVIDER_SITE_OTHER): Payer: Medicare Other | Admitting: Internal Medicine

## 2014-01-09 VITALS — BP 112/60 | HR 115 | Temp 98.4°F | Ht 67.0 in | Wt 124.0 lb

## 2014-01-09 DIAGNOSIS — J479 Bronchiectasis, uncomplicated: Secondary | ICD-10-CM

## 2014-01-09 DIAGNOSIS — M3130 Wegener's granulomatosis without renal involvement: Secondary | ICD-10-CM

## 2014-01-09 MED ORDER — LEVOFLOXACIN 750 MG PO TABS: 750.0000 mg | ORAL_TABLET | Freq: Every day | ORAL | Status: DC

## 2014-01-09 NOTE — Patient Instructions (Addendum)
If mucus gets nasty,  Levaquin 750 x 7 days  If mucus gets thick >  mucinex dm 1200 mg every 12 h and use the flutter valve  Please schedule a follow up office visit in 4 weeks, sooner if needed with cxr

## 2014-01-09 NOTE — Progress Notes (Signed)
Subjective:    Patient ID: Richard [name], male    DOB: Feb 12, 1936     MRN: [id number]  Brief patient profile:  78  yowm never smoker with dx of Longstanding bronchiectasis then dx with WG 03/2010 > requested establish with Richard [name].   Admit [name] dx WG by pos c anca 03/2010, supportive tbbx but not vats   Admit [name] dx spont L Ptx 9/08/15/09 requiring Chest tube but no surgery   05/24/10--NP ov/ med reivew. Since last visit. has been hospitalized 05/12/2010-- 10/10/2011for Right pneumothorax., with underlying Wegener granulomatosis. and . Bronchiectasis. Pt was found to have Right Pneumo. Chest tube was placed. with improvement and removed 10/7. Xray on 10/10 w/ resolved pnuemo. He was continued on cytoxan and bactrim for PCP prophylaxis. rec. Prednisone 20 mg reduce to one half daily  Aspirin 81 mg one daily with breakfast but stop for any bleeding  See Patient Care Coordinator before leaving for cardiology appt > saw Richard [name], ? LHC needed     October 13, 2010 --Presents for an acute office visit. Complains of productive cough with yellow mucus, sore throat, rattling in chest, low grade temp x3days. Currently on pred 1/2 every other day . OTC not helping. rec continue qod pred, rx with augmentin x 10 days   11/16/2010 ov all smiles, no change on days of prednisone, minimal discolored sputum.   Try off prednisone completely.  If you feel a lot worse over the next  week(worse breathing/ coughing/ nausea or loss of appetite) resume prednisone 5 mg one half daily until better then return back one half every other day    12/16/2010 ov/Richard [name] cc no better on 5mg  dosed one half every other day (not the dose rec), still nauseated, still discolored p doxy complete May 7, sob in shower but not as much walking and no longer using 02, struggling with contingency plans. rec Work on inhaler technique:  If short of breath go ahead and use the xopenex hfa 2 puffs every 4 hours  Prednisone 5 mg 4 daily until better, then  2 daily x 5 days, then 1 daily thereafter   12/31/10 ov/[name], exac of bronchiectasis/wheeze no hemoptysis rx with pred burst and levaquin 750 x 5d  01/13/2011 ov/Richard [name]  Cc sob and cough better now tapered prednisone to 5 mg each am using xop once daily around noon no problem with sleep or am exac of cough.   rec  02/11/2011 Richard [name] cc recurrent hemoptysis on top of yellow that usually comes up esp in am x one year but no epistaxis, no cp or arthralgias or increased doe. rec Levaquin 750 mg x 5 days courses whenever mucus turns bloody, more dark or with high fever. Stop cytoxan and sulfasoxazole  Don't take aspirin if any active bleeding   03/16/2011 f/u ov/Richard [name] cc no more blood,  Minimal yellow mucus, back on asa 81 without hemoptysis. No sob rec  Try prednisone 5 mg  One on even and a half on odd   Acute Visit 04/26/11 -Richard [name]  Cc  increased chest congestion, yellow sputum beginning about 1 week ago. He took a levaquin x 5 days (9/11 - 9/15). Has a sore throat, getting better. Nasal drainage and sinus drainage worse over last 3 -4 days. He tells me that he cut grass last Thursday, may have started this. No sick contacts.  rec Please start levaquin daily for another 5 days Continue your current prednisone dosing  Start loratadine 10mg  daily until your next  visit    05/27/2011 f/u ov/Richard [name] on 2.5 mg pred /day  cc still coughing, congested though much less purulent sputum p levaquin and no hemoptysis.  Does note fatigue > baseline  With doe worse x garbage cans to street and back and has not tried the xopenex at all. rec Try using xopenex as needed for short of breath Only take the loratidine (clariton) if needed for itching sneezing runny nose     07/19/2011 ER follow up 07/15/11 with increased cough, congestion and fever. Dx with acute bronchiectasis and possible influenza. He has started on Levaquin 750mg  x 5 days w/ last day today. Also given Tamiflu but did not fill this. Fevers are  decreasing. Cough and congesiton are improved but not gone. Still feels weak.CXR w/o acute changes.  Labs from ER reviewed with elevated WBC 11k, w/ left shift. Neg strep test. No influenza data found.  rec Extend Levaquin daily for 5 days  Mucinex DM Twice daily  As needed  Cough/congestion  Fluids and rest  Tylenol As needed  Fever   08/24/2011 f/u ov/Richard [name] on Prednisone 5 mg one half each am  cc cough/ congestion back to baseline, no limiting sob. rec Try prednisone 5 mg one half pill alternate days to see if any worse respiratory symptoms or nausea, fatigue get worse Richard to adjust the mucinex dm to take up to 2 every 12 hours as needed For itching sneezing and runny nose, use the loratidine 10 mg (clariton)   OV 09/28/2011 ACUTE OFFICE VISIT WITH Richard Richard [name] with flare cough  rec Please take Take prednisone 40mg  once daily x 3 days, then 30mg  once daily x 3 days, then 20mg  once daily x 3 days, then prednisone 10mg  once daily  x 3 days and then 5mg  daily x 3 days and then to your baseline regimen of alternating  11/28/2011 f/u ov/Richard [name] cc much better on higher doses of prednisone but worse w/in 5 days of floor of qod regimen with increase cough/ congestion minimally discolored sputum no epistaxis hemoptysis or aches/ pains x vague discomfort r ant chest under breast.  No hematuria rec Prednisone 20 mg per day until better, then 10 mg per day x 5 days, and then 5 mg per day.    05/31/2012 f/u ov/Richard [name] cc no change in cough, yellowish, thick, worse in ams,  not  Bloody- no epistaxis, rash, arthralgias or sob.   rec Try prednisone 5 mg  One on even and one-half  on odd - if no change then 2 weeks before your next scheduled pulmonary visit take one half every day   10/11/2012 f/u ov/Richard [name] still on one on even and half on odd prednisone(did not taper as per instructions, though no flare in symptoms), rare need for levaquin rec Prednisone 5 mg pill take one half daily  11/23/2012 f/u ov/Richard [name] re  WG/ bronchiectasis Chief Complaint  Patient presents with  . Follow-up    Had PFT today-sob same,cough-yellow,wheezing occass.,  no hemoptysis, no real limiting sob though sedentary, no arthralgias or rash maintaining 2.5 mg daily No change prednisone 5 mg one half daily  > Richard to try every other day if doing great May 1 flare rx levaquin and pred May 16 lap [facility]ole [facility]   02/19/2013 f/u ov/Richard [name] re WG and bronchietasis still on 5 mg one half daily  Chief Complaint  Patient presents with  . Follow-up    Pt c/o increased cough x 2 wks- prod with minimal yellow sputum. He also  has some increased SOB since increased cough started.   symptoms came on gradually with min proression and no change rx yet > has saba but not using.  Cough better with mucinex. No hemoptysis or epistaxis. Sob only with exertion rec When wheezing or coughing is worse, increase prednisone to 20 mg daily until better, then 10 mg day x 3 days and 5 mg per day x 3 days then 2.5 mg per day thereafter. mucinex dose is 600 take 2 every 12 hours as needed for cough/ congestion/ thick mucus   04/26/2013 f/u ov/Richard [name] re: WG/ bronchiectasis p completed 10 days of levaquin 750 on 9/14  Chief Complaint  Patient presents with  . Follow-up    Breathing better. cough w/ yellow tint phlem.    Mucus never Never bloody and much lighter p levaquin, no need for saba on symb 160 2bid - wife concerned levaquin resistance developing. rec factive one daily x 5 days next time mucus gets nasty> did not tolerate Prednisone ceiling is 10 mg per day and floor is 5 mg daily  Only use your xopenex as a rescue medication   07/26/2013 f/u ov/Richard [name] re: bronchiectasis / hj/o WG Chief Complaint  Patient presents with  . Follow-up    Breathing is unchanged since the last visit. Still has prod cough with moderate yellow sputum.  He also c/o chest tightness since he stoped levaquin approx 10 days ago.   Not using flutter or xopenex very much at all.  Maintaining floor of 5 mg daily  rec Cipro 750 twice daily x 10 days if you get nasty mucus Use the flutter valve and chest percussion    08/19/2013 f/u ov/Richard [name] re: ? Recurrent WG Chief Complaint  Patient presents with  . Follow-up    Breathing is unchanged. Cough unchanged-prod cpugh w/ yellow phlem. On 2nd dose of cipro  now on day 5/10 cipro and less dark, never bloody, no sinus complaints, arthritis rash or fever or hematuria. >ANCA 1:80 titer, started on cytoxan   09/06/2013 Acute OV  Complains of  weakness, decreased appetite, temp up to 101, wheezing, increased SOB, prod cough with small amounts of yellow mucus x 5days.  Began Cipro 750mg  09/03/13  Fever is better . Was started on cytoxan on 1/19 for suspected wegeners flare (w/ ANCA positive 1:80) .  Appetite is poor but weight is stable.  rec Finish Cipro 750 twice daily x 10 days as directed.  Use the flutter valve and chest percussion  Fluids and rest .  Tylenol As needed   Labs and xray >> no change     09/12/2013 f/u ov/Richard [name] re:  Chief Complaint  Patient presents with  . Follow-up    Pt c/o increased SOB, congestion and cough for the past 7-10 days. Cough is prod with minimal yellow sputum. Finished round of cipro on 09/11/13.     Changed pred to 10 mg daily on his own Not using flutter or tramadol  rec Continue prednisone at 10 mg daily for now For cough > mucinex dm up to 1200 mg every 12 hours and use the flutter valve - supplement with tramadol for severe or uncontrolled coughing Augmentin 875 mg take one pill twice daily  X 10 days - take at breakfast and supper with large glass of water.  It would help reduce the usual side effects (diarrhea and yeast infections) if you ate cultured yogurt at lunch.   09/27/2013 f/u ov/Richard [name] re:  WG on pred 10 and ctx  50 mg daily  Chief Complaint  Patient presents with  . Follow-up    Pt states cough and SOB seems worse since the last visit. Still producing yellow sputum. He  states has low grade temp every am.   assoc with nasal congestion but no epistaxis or hemoptysis  rec Stop Zebeta Schedule sinus CT> 2/09/14/13 Mild to moderate mucosal thickening in the paranasal sinuses without  air-fluid level. For cough use the mucinex dm up to 1200 mg every 12 hours and as much flutter valve as possible For breathing use xopenex up to 2 puffs every 4 hours if needed and don't leave home wihout it   Admit date: 10/04/2013  Discharge date: 10/07/2013  Discharge Diagnoses:  SOB (shortness of breath)  Wegener's granulomatosis  Bronchiectasis with acute exacerbation  SVT (supraventricular tachycardia)  Cough  Weakness generalized  Discharge Condition: Stable and improved  Filed Weights    10/04/13 1539   Weight:  58.423 kg (128 lb 12.8 oz)   History of present illness:  Patient is a pleasant 78 year old white man with a past medical history significant for Wegener's granulomatosis diagnosed in 2011 was on Cytoxan until 2013. He follows with Richard. Melvyn [name]. He is maintained on prednisone 10 mg daily. He had been feeling well until mid January at which point a chest x-ray showed increased nodules and Richard. Melvyn [name] decided to restart Cytoxan. Patient came into the hospital with the above-mentioned complaints. His heart rate was noted to be in the 180s in the rhythm of SVT. He converted to sinus rhythm spontaneously. CT scan of the chest shows bronchiectasis as well as numerous cavitary and non-cavitary nodules. We have been asked to admit him for further evaluation and management. In addition to his cough and mild shortness of breath, he endorses low-grade temperatures for about 2 weeks as well as increasing sinus symptoms.  Hospital Course:  Wegener's Granulomatosis/Bronchiectasis with Acute Exacerbation  -Cx data remains negative to date.  -As discussed with Richard. Lamonte [name], will transition antibiotics to cipro for 3 more days, continue cytoxan (bactrim while on cytoxan for PCP prophylaxis),  and slow steroid taper until seen again by Richard. Melvyn [name].  SVT  -Spontaneously converted to NSR.       10/15/2013 post hosp ov/Richard [name] re: flare of bronchiectasis  Chief Complaint  Patient presents with  . HFU    Breathing unchanged since hospital d/c. His cough is some better.   breathing when walk better but not back to baseline Strength a lot better, cough is a lot better slt yellow sputum Now on bactrim ds daily and cytoxan 50 mg one daily  pred 10 mg x 3 per day > floor of 10 mg  rec No change rx    11/07/2013 f/u ov/Richard [name] re:   WG/ new HA L Frontal x late Feb 2015 / pred 10 mg daily  Chief Complaint  Patient presents with  . Follow-up    Pt c/o SOB, sometimes prod cough with yellow mucous, worse in a.m., PND, sinus congestion, sneezing.    finished augmentin x 4 days prior to OV   xopenex avg daily around noon  Uses nasal  Saline variably bloody nasal discharge  rec Try mucinex d as per bottle for nasal congestion Please see patient coordinator before you leave today  to schedule full sinus CT Remember to blow symbicort out thru nose Richard to use tramadol at bedtime to see if helps sleep  12/06/13 to ER with dehyrdration   01/09/2014 f/u ov/Richard [name] re:  Chief Complaint  Patient presents with  . Follow-up    Pt c/o non prod cough and states that his breathing is no better. He c/o poor appetite.       No obvious day to day or daytime variabilty or cp or chest tightness, subjective wheeze overt   hb symptoms. No unusual exp hx or h/o childhood pna/ asthma or knowledge of premature birth.  Sleeping Richard without nocturnal  or early am exacerbation  of respiratory  c/o's or need for noct saba. Also denies any obvious fluctuation of symptoms with weather or environmental changes or other aggravating or alleviating factors except as outlined above   Current Medications, Allergies, Complete Past Medical History, Past Surgical History, Family History, and Social History were reviewed in [employer] record.  ROS  The following are not active complaints unless bolded sore throat, dysphagia, dental problems, itching, sneezing,  nasal congestion or excess/ purulent secretions, ear ache,   fever, chills, sweats, unintended wt loss, pleuritic or exertional cp, hemoptysis,  orthopnea pnd or leg swelling, presyncope, palpitations, heartburn, abdominal pain, anorexia, nausea, vomiting, diarrhea  or change in bowel or urinary habits, change in stools or urine, dysuria,hematuria,  rash, arthralgias, visual complaints, headache, numbness weakness or ataxia or problems with walking or coordination,  change in mood/affect or memory.               Objective:   Physical Exam  wt   128  04/26/11 >07/08/2011  134> 134 11/28/2011 > 02/29/2012 136> 05/31/2012  136>  134 11/22/12 > 07/26/2013  136 > 08/21/2013  136 >133 09/06/2013 >  09/12/2013  133 > 132 09/27/2013 > 10/15/2013 131 > 11/07/2013  128 > 01/09/14 124  amb thin pleasant wm nad  SKIN: no rash, lesions  NODES: no lymphadenopathy  HEENT: /AT, EOM- WNL, Conjuctivae- clear, PERRLA, TM-WNL, Nose- clear, Throat- clear and wnl, Mallampati III  NECK: Supple w/ fair ROM, JVD- none, normal carotid impulses w/o bruits Thyroid-  CHEST: min bilateral exp rhonchi  No resp distress HEART: RRR, no m/g/r heard  ABDOMEN: Soft and nl EXT:  No deformities or restrucitons      CT  12/06/13 Mildly worse bilateral interstitial change with associated  multifocal nodularity when compared to November 07, 2013. This may  reflect finding of patient's known Wegner's granulomatosis.  Concurrent atypical fungal infection is not excluded.   Lab Results  Component Value Date   ESRSEDRATE 61* 11/07/2013      Recent Labs Lab 11/07/13 1501  NA 137  K 5.4*  CL 103  CO2 25  BUN 20  CREATININE 1.3  GLUCOSE 108*    Recent Labs Lab 11/07/13 1501  HGB 11.0*  HCT 33.9*  WBC 9.9  PLT 477.0*               Assessment & Plan:

## 2014-01-10 NOTE — Assessment & Plan Note (Signed)
-  Dx by TBBX/ pos anca 03/2010 > CTX started    - Off all prednisone with baseline esr 31 11/16/2010  > restarted 12/16/2010 with nausea and wt down to 115 but ok sats walking x 3 laps   - Recheck ANCA sent  12/16/2010  >  neg   -Trial off cytoxan starting 02/12/2011 >   03/16/11 stopped  - Prednisone ceiling is 10 mg per day and floor is 5 mg daily as of 04/26/13  - 08/19/13 ANCA POS 1:80 with increasing nodular dz > rx 08/21/2013 cytoxan 50 mg daily  - Started bactrim ds one daily 10/07/13 - ESR up 11/07/13 rec double pred to 20 mg until feeling better then floor of 10 mg   He didn't tolerate higher doses of cytoxan previously so rec leave on 50 mg per day unless convincing evidence of active vasculitis  The goal with a chronic steroid dependent illness is always arriving at the lowest effective dose that controls the disease/symptoms and not accepting a set "formula" which is based on statistics or guidelines that don't always take into account patient  variability or the natural hx of the dz in every individual patient, which may well vary over time.  For now therefore I recommend the patient maintain  20 mg per day as ceiling and 10 mg per day as floor

## 2014-01-10 NOTE — Assessment & Plan Note (Signed)
-   HFA 90% p coaching 12/16/2010      - PFT's 06/21/11   FEV1  1.60 (61%) ratio 48 and 10% better p B2,  DLCO 83%     - PFT's 11/22/2012 FEV1  1.52 (62%) arnd 46 and no change p B2 DLCO 89%   rec continue levaquin x 7 days prn purulent sputum

## 2014-02-12 ENCOUNTER — Other Ambulatory Visit (INDEPENDENT_AMBULATORY_CARE_PROVIDER_SITE_OTHER): Payer: Medicare Other

## 2014-02-12 ENCOUNTER — Encounter: Payer: Self-pay | Admitting: Internal Medicine

## 2014-02-12 ENCOUNTER — Ambulatory Visit (INDEPENDENT_AMBULATORY_CARE_PROVIDER_SITE_OTHER)
Admission: RE | Admit: 2014-02-12 | Discharge: 2014-02-12 | Disposition: A | Discharge status: Home / Self Care | Payer: Medicare Other | Source: Ambulatory Visit | Attending: Internal Medicine | Admitting: Internal Medicine

## 2014-02-12 ENCOUNTER — Ambulatory Visit (INDEPENDENT_AMBULATORY_CARE_PROVIDER_SITE_OTHER): Payer: Medicare Other | Admitting: Internal Medicine

## 2014-02-12 VITALS — BP 102/66 | HR 96 | Temp 98.2°F | Ht 67.0 in | Wt 123.0 lb

## 2014-02-12 DIAGNOSIS — J479 Bronchiectasis, uncomplicated: Secondary | ICD-10-CM

## 2014-02-12 DIAGNOSIS — M3130 Wegener's granulomatosis without renal involvement: Secondary | ICD-10-CM

## 2014-02-12 DIAGNOSIS — R0602 Shortness of breath: Secondary | ICD-10-CM

## 2014-02-12 DIAGNOSIS — Z23 Encounter for immunization: Secondary | ICD-10-CM

## 2014-02-12 LAB — CBC WITH DIFFERENTIAL/PLATELET
BASOS ABS: 0 10*3/uL (ref 0.0–0.1)
Basophils Relative: 0.3 % (ref 0.0–3.0)
EOS ABS: 0 10*3/uL (ref 0.0–0.7)
Eosinophils Relative: 0.3 % (ref 0.0–5.0)
HCT: 28.2 % — ABNORMAL LOW (ref 39.0–52.0)
Hemoglobin: 9.3 g/dL — ABNORMAL LOW (ref 13.0–17.0)
Lymphocytes Relative: 4 % — ABNORMAL LOW (ref 12.0–46.0)
Lymphs Abs: 0.3 10*3/uL — ABNORMAL LOW (ref 0.7–4.0)
MCHC: 33 g/dL (ref 30.0–36.0)
MCV: 96.2 fl (ref 78.0–100.0)
MONO ABS: 0.4 10*3/uL (ref 0.1–1.0)
Monocytes Relative: 5.2 % (ref 3.0–12.0)
NEUTROS ABS: 6.4 10*3/uL (ref 1.4–7.7)
NEUTROS PCT: 90.2 % — AB (ref 43.0–77.0)
Platelets: 317 10*3/uL (ref 150.0–400.0)
RBC: 2.93 Mil/uL — ABNORMAL LOW (ref 4.22–5.81)
RDW: 18.8 % — AB (ref 11.5–15.5)
WBC: 7.1 10*3/uL (ref 4.0–10.5)

## 2014-02-12 LAB — BASIC METABOLIC PANEL
BUN: 25 mg/dL — ABNORMAL HIGH (ref 6–23)
CO2: 28 meq/L (ref 19–32)
CREATININE: 1.6 mg/dL — AB (ref 0.4–1.5)
Calcium: 8.8 mg/dL (ref 8.4–10.5)
Chloride: 106 mEq/L (ref 96–112)
GFR: 45.26 mL/min — ABNORMAL LOW (ref >60.00)
GLUCOSE: 125 mg/dL — AB (ref 70–99)
Potassium: 4.6 mEq/L (ref 3.5–5.1)
Sodium: 139 mEq/L (ref 135–145)

## 2014-02-12 LAB — SEDIMENTATION RATE: Sed Rate: 48 mm/hr — ABNORMAL HIGH (ref 0–22)

## 2014-02-12 NOTE — Patient Instructions (Addendum)
Please see patient coordinator before you leave today  to schedule VEST  Tdap today   Please remember to go to the lab department downstairs for your tests - we will call you with the results when they are available.  Please schedule a follow up office visit in 6 weeks, call sooner if needed

## 2014-02-12 NOTE — Assessment & Plan Note (Addendum)
-    HFA 90% p coaching 12/16/2010   -   PFT's 06/21/11   FEV1  1.60 (61%) ratio 48 and 10% better p B2,  DLCO 83%  -   PFT's 11/22/2012 FEV1  1.52 (62%) arnd 46 and no change p B2 DLCO 89%   CT Done 10/04/13 severe multifocal bronchiectasis as well as numerous cavitary and  non-cavitary nodules >> Confirms bronchiectasis  care giver not able to administer effective chest physiotherapy Has not responded to alternative therapy  As of today   Pt reports productive cough > 6 m duration or 3 exac 12 months

## 2014-02-12 NOTE — Progress Notes (Signed)
Subjective:    Patient ID: Richard [name], male    DOB: Feb 12, 1936     MRN: [id number]  Brief patient profile:  78  yowm never smoker with dx of Longstanding bronchiectasis then dx with WG 03/2010 > requested establish with [name].   Admit [name] dx WG by pos c anca 03/2010, supportive tbbx but not vats   Admit [name] dx spont L Ptx 9/08/15/09 requiring Chest tube but no surgery   05/24/10--NP ov/ med reivew. Since last visit. has been hospitalized 05/12/2010-- 10/10/2011for Right pneumothorax., with underlying Wegener granulomatosis. and . Bronchiectasis. Pt was found to have Right Pneumo. Chest tube was placed. with improvement and removed 10/7. Xray on 10/10 w/ resolved pnuemo. He was continued on cytoxan and bactrim for PCP prophylaxis. rec. Prednisone 20 mg reduce to one half daily  Aspirin 81 mg one daily with breakfast but stop for any bleeding  See Patient Care Coordinator before leaving for cardiology appt > saw [name], ? LHC needed     October 13, 2010 --Presents for an acute office visit. Complains of productive cough with yellow mucus, sore throat, rattling in chest, low grade temp x3days. Currently on pred 1/2 every other day . OTC not helping. rec continue qod pred, rx with augmentin x 10 days   11/16/2010 ov all smiles, no change on days of prednisone, minimal discolored sputum.   Try off prednisone completely.  If you feel a lot worse over the next  week(worse breathing/ coughing/ nausea or loss of appetite) resume prednisone 5 mg one half daily until better then return back one half every other day    12/16/2010 ov/[name] cc no better on 5mg  dosed one half every other day (not the dose rec), still nauseated, still discolored p doxy complete May 7, sob in shower but not as much walking and no longer using 02, struggling with contingency plans. rec Work on inhaler technique:  If short of breath go ahead and use the xopenex hfa 2 puffs every 4 hours  Prednisone 5 mg 4 daily until better, then  2 daily x 5 days, then 1 daily thereafter   12/31/10 ov/[name], exac of bronchiectasis/wheeze no hemoptysis rx with pred burst and levaquin 750 x 5d  01/13/2011 ov/[name]  Cc sob and cough better now tapered prednisone to 5 mg each am using xop once daily around noon no problem with sleep or am exac of cough.   rec  02/11/2011 [name] cc recurrent hemoptysis on top of yellow that usually comes up esp in am x one year but no epistaxis, no cp or arthralgias or increased doe. rec Levaquin 750 mg x 5 days courses whenever mucus turns bloody, more dark or with high fever. Stop cytoxan and sulfasoxazole  Don't take aspirin if any active bleeding   03/16/2011 f/u ov/[name] cc no more blood,  Minimal yellow mucus, back on asa 81 without hemoptysis. No sob rec  Try prednisone 5 mg  One on even and a half on odd   Acute Visit 04/26/11 -[name]  Cc  increased chest congestion, yellow sputum beginning about 1 week ago. He took a levaquin x 5 days (9/11 - 9/15). Has a sore throat, getting better. Nasal drainage and sinus drainage worse over last 3 -4 days. He tells me that he cut grass last Thursday, may have started this. No sick contacts.  rec Please start levaquin daily for another 5 days Continue your current prednisone dosing  Start loratadine 10mg  daily until your next  visit    05/27/2011 f/u ov/[name] on 2.5 mg pred /day  cc still coughing, congested though much less purulent sputum p levaquin and no hemoptysis.  Does note fatigue > baseline  With doe worse x garbage cans to street and back and has not tried the xopenex at all. rec Try using xopenex as needed for short of breath Only take the loratidine (clariton) if needed for itching sneezing runny nose     07/19/2011 ER follow up 07/15/11 with increased cough, congestion and fever. Dx with acute bronchiectasis and possible influenza. He has started on Levaquin 750mg  x 5 days w/ last day today. Also given Tamiflu but did not fill this. Fevers are  decreasing. Cough and congesiton are improved but not gone. Still feels weak.CXR w/o acute changes.  Labs from ER reviewed with elevated WBC 11k, w/ left shift. Neg strep test. No influenza data found.  rec Extend Levaquin daily for 5 days  Mucinex DM Twice daily  As needed  Cough/congestion  Fluids and rest  Tylenol As needed  Fever   08/24/2011 f/u ov/[name] on Prednisone 5 mg one half each am  cc cough/ congestion back to baseline, no limiting sob. rec Try prednisone 5 mg one half pill alternate days to see if any worse respiratory symptoms or nausea, fatigue get worse Ok to adjust the mucinex dm to take up to 2 every 12 hours as needed For itching sneezing and runny nose, use the loratidine 10 mg (clariton)   OV 09/28/2011 ACUTE OFFICE VISIT WITH DR [name] with flare cough  rec Please take Take prednisone 40mg  once daily x 3 days, then 30mg  once daily x 3 days, then 20mg  once daily x 3 days, then prednisone 10mg  once daily  x 3 days and then 5mg  daily x 3 days and then to your baseline regimen of alternating  11/28/2011 f/u ov/[name] cc much better on higher doses of prednisone but worse w/in 5 days of floor of qod regimen with increase cough/ congestion minimally discolored sputum no epistaxis hemoptysis or aches/ pains x vague discomfort r ant chest under breast.  No hematuria rec Prednisone 20 mg per day until better, then 10 mg per day x 5 days, and then 5 mg per day.    05/31/2012 f/u ov/[name] cc no change in cough, yellowish, thick, worse in ams,  not  Bloody- no epistaxis, rash, arthralgias or sob.   rec Try prednisone 5 mg  One on even and one-half  on odd - if no change then 2 weeks before your next scheduled pulmonary visit take one half every day   10/11/2012 f/u ov/[name] still on one on even and half on odd prednisone(did not taper as per instructions, though no flare in symptoms), rare need for levaquin rec Prednisone 5 mg pill take one half daily  11/23/2012 f/u ov/[name] re  WG/ bronchiectasis Chief Complaint  Patient presents with  . Follow-up    Had PFT today-sob same,cough-yellow,wheezing occass.,  no hemoptysis, no real limiting sob though sedentary, no arthralgias or rash maintaining 2.5 mg daily No change prednisone 5 mg one half daily  > ok to try every other day if doing great May 1 flare rx levaquin and pred May 16 lap chole [facility]   02/19/2013 f/u ov/Marika Mahaffy re WG and bronchietasis still on 5 mg one half daily  Chief Complaint  Patient presents with  . Follow-up    Pt c/o increased cough x 2 wks- prod with minimal yellow sputum. He also  has some increased SOB since increased cough started.   symptoms came on gradually with min proression and no change rx yet > has saba but not using.  Cough better with mucinex. No hemoptysis or epistaxis. Sob only with exertion rec When wheezing or coughing is worse, increase prednisone to 20 mg daily until better, then 10 mg day x 3 days and 5 mg per day x 3 days then 2.5 mg per day thereafter. mucinex dose is 600 take 2 every 12 hours as needed for cough/ congestion/ thick mucus   04/26/2013 f/u ov/Dravyn Severs re: WG/ bronchiectasis p completed 10 days of levaquin 750 on 9/14  Chief Complaint  Patient presents with  . Follow-up    Breathing better. cough w/ yellow tint phlem.    Mucus never Never bloody and much lighter p levaquin, no need for saba on symb 160 2bid - wife concerned levaquin resistance developing. rec factive one daily x 5 days next time mucus gets nasty> did not tolerate Prednisone ceiling is 10 mg per day and floor is 5 mg daily  Only use your xopenex as a rescue medication   07/26/2013 f/u ov/Aleeha Boline re: bronchiectasis / hj/o WG Chief Complaint  Patient presents with  . Follow-up    Breathing is unchanged since the last visit. Still has prod cough with moderate yellow sputum.  He also c/o chest tightness since he stoped levaquin approx 10 days ago.   Not using flutter or xopenex very much at all.  Maintaining floor of 5 mg daily  rec Cipro 750 twice daily x 10 days if you get nasty mucus Use the flutter valve and chest percussion    08/19/2013 f/u ov/Laytoya Ion re: ? Recurrent WG Chief Complaint  Patient presents with  . Follow-up    Breathing is unchanged. Cough unchanged-prod cpugh w/ yellow phlem. On 2nd dose of cipro  now on day 5/10 cipro and less dark, never bloody, no sinus complaints, arthritis rash or fever or hematuria. >ANCA 1:80 titer, started on cytoxan   09/06/2013 Acute OV  Complains of  weakness, decreased appetite, temp up to 101, wheezing, increased SOB, prod cough with small amounts of yellow mucus x 5days.  Began Cipro 750mg  09/03/13  Fever is better . Was started on cytoxan on 1/19 for suspected wegeners flare (w/ ANCA positive 1:80) .  Appetite is poor but weight is stable.  rec Finish Cipro 750 twice daily x 10 days as directed.  Use the flutter valve and chest percussion  Fluids and rest .  Tylenol As needed   Labs and xray >> no change     09/12/2013 f/u ov/Azusena Erlandson re:  Chief Complaint  Patient presents with  . Follow-up    Pt c/o increased SOB, congestion and cough for the past 7-10 days. Cough is prod with minimal yellow sputum. Finished round of cipro on 09/11/13.     Changed pred to 10 mg daily on his own Not using flutter or tramadol  rec Continue prednisone at 10 mg daily for now For cough > mucinex dm up to 1200 mg every 12 hours and use the flutter valve - supplement with tramadol for severe or uncontrolled coughing Augmentin 875 mg take one pill twice daily  X 10 days - take at breakfast and supper with large glass of water.  It would help reduce the usual side effects (diarrhea and yeast infections) if you ate cultured yogurt at lunch.   09/27/2013 f/u ov/Daphyne Miguez re:  WG on pred 10 and ctx  50 mg daily  Chief Complaint  Patient presents with  . Follow-up    Pt states cough and SOB seems worse since the last visit. Still producing yellow sputum. He  states has low grade temp every am.   assoc with nasal congestion but no epistaxis or hemoptysis  rec Stop Zebeta Schedule sinus CT> 2/09/14/13 Mild to moderate mucosal thickening in the paranasal sinuses without  air-fluid level. For cough use the mucinex dm up to 1200 mg every 12 hours and as much flutter valve as possible For breathing use xopenex up to 2 puffs every 4 hours if needed and don't leave home wihout it   Admit date: 10/04/2013  Discharge date: 10/07/2013  Discharge Diagnoses:  SOB (shortness of breath)  Wegener's granulomatosis  Bronchiectasis with acute exacerbation  SVT (supraventricular tachycardia)  Cough  Weakness generalized  Discharge Condition: Stable and improved  Filed Weights    10/04/13 1539   Weight:  58.423 kg (128 lb 12.8 oz)   History of present illness:  Patient is a pleasant 78 year old white man with a past medical history significant for Wegener's granulomatosis diagnosed in 2011 was on Cytoxan until 2013. He follows with Dr. [name]. He is maintained on prednisone 10 mg daily. He had been feeling well until mid January at which point a chest x-ray showed increased nodules and Dr. [name] decided to restart Cytoxan. Patient came into the hospital with the above-mentioned complaints. His heart rate was noted to be in the 180s in the rhythm of SVT. He converted to sinus rhythm spontaneously. CT scan of the chest shows bronchiectasis as well as numerous cavitary and non-cavitary nodules. We have been asked to admit him for further evaluation and management. In addition to his cough and mild shortness of breath, he endorses low-grade temperatures for about 2 weeks as well as increasing sinus symptoms.  Hospital Course:  Wegener's Granulomatosis/Bronchiectasis with Acute Exacerbation  -Cx data remains negative to date.  -As discussed with Dr. [name], will transition antibiotics to cipro for 3 more days, continue cytoxan (bactrim while on cytoxan for PCP prophylaxis),  and slow steroid taper until seen again by Dr. [name].  SVT  -Spontaneously converted to NSR.       10/15/2013 post hosp ov/Edinson Domeier re: flare of bronchiectasis  Chief Complaint  Patient presents with  . HFU    Breathing unchanged since hospital d/c. His cough is some better.   breathing when walk better but not back to baseline Strength a lot better, cough is a lot better slt yellow sputum Now on bactrim ds daily and cytoxan 50 mg one daily  pred 10 mg x 3 per day > floor of 10 mg  rec No change rx    11/07/2013 f/u ov/Mirinda Monte re:   WG/ new HA L Frontal x late Feb 2015 / pred 10 mg daily  Chief Complaint  Patient presents with  . Follow-up    Pt c/o SOB, sometimes prod cough with yellow mucous, worse in a.m., PND, sinus congestion, sneezing.    finished augmentin x 4 days prior to OV   xopenex avg daily around noon  Uses nasal  Saline variably bloody nasal discharge  rec Try mucinex d as per bottle for nasal congestion Please see patient coordinator before you leave today  to schedule full sinus CT Remember to blow symbicort out thru nose Ok to use tramadol at bedtime to see if helps sleep  12/06/13 to ER with dehyrdration   01/09/2014 f/u ov/Tobias Avitabile re:  Chief Complaint  Patient presents with  . Follow-up    Pt c/o non prod cough and states that his breathing is no better. He c/o poor appetite.   rec If mucus gets nasty,  Levaquin 750 x 7 days If mucus gets thick >  mucinex dm 1200 mg every 12 h and use the flutter valve   02/12/2014 f/u ov/Dulcinea Kinser re: bronchiectasis / pred 10 / cyt 15 Chief Complaint  Patient presents with  . Follow-up    Pt states that his symptoms are unchanged since last visit. Finished round of levaquin x 2 days ago.  No new co's today.   ok at [employer] leaning on the cart  New occ chest discomfort when sob x sev months, goes away at rest. No assoc diaph, nausea, radiation Has never tried xopenex for this symptom to date, encouraged to do so    No obvious day  to day or daytime variabilty or cp or chest tightness, subjective wheeze overt   hb symptoms. No unusual exp hx or h/o childhood pna/ asthma or knowledge of premature birth.  Sleeping ok without nocturnal  or early am exacerbation  of respiratory  c/o's or need for noct saba. Also denies any obvious fluctuation of symptoms with weather or environmental changes or other aggravating or alleviating factors except as outlined above   Current Medications, Allergies, Complete Past Medical History, Past Surgical History, Family History, and Social History were reviewed in [employer] record.  ROS  The following are not active complaints unless bolded sore throat, dysphagia, dental problems, itching, sneezing,  nasal congestion or excess/ purulent secretions, ear ache,   fever, chills, sweats, unintended wt loss, pleuritic or exertional cp, hemoptysis,  orthopnea pnd or leg swelling, presyncope, palpitations, heartburn, abdominal pain, anorexia, nausea, vomiting, diarrhea  or change in bowel or urinary habits, change in stools or urine, dysuria,hematuria,  rash, arthralgias, visual complaints, headache, numbness weakness or ataxia or problems with walking or coordination,  change in mood/affect or memory.               Objective:   Physical Exam  wt   128  04/26/11 >07/08/2011  134> 134 11/28/2011 > 02/29/2012 136> 05/31/2012  136>  134 11/22/12 > 07/26/2013  136 > 08/21/2013  136 >133 09/06/2013 >  09/12/2013  133 > 132 09/27/2013 > 10/15/2013 131 > 11/07/2013  128 > 01/09/14 124 > 02/12/2014  123   amb thin pleasant wm nad  SKIN: no rash, lesions  NODES: no lymphadenopathy  HEENT: [place]/AT, EOM- WNL, Conjuctivae- clear, PERRLA, TM-WNL, Nose- clear, Throat- clear and wnl, Mallampati III  NECK: Supple w/ fair ROM, JVD- none, normal carotid impulses w/o bruits Thyroid-  CHEST: min bilateral exp rhonchi  No resp distress HEART: RRR, no m/g/r heard  ABDOMEN: Soft and nl EXT:  No deformities or  restrucitons      CXR  02/12/2014 :   Changes compatible with Wegener's granulomatosis as seen on prior  studies. Aeration appears mildly improved since the most recent  plain films.       Recent Labs Lab 02/12/14 1428  NA 139  K 4.6  CL 106  CO2 28  BUN 25*  CREATININE 1.6*  GLUCOSE 125*    Recent Labs Lab 02/12/14 1428  HGB 9.3*  HCT 28.2*  WBC 7.1  PLT 317.0        Lab Results  Component Value Date   ESRSEDRATE 48* 02/12/2014   ESRSEDRATE 43* 12/06/2013  ESRSEDRATE 61* 11/07/2013    Lab Results  Component Value Date   PROBNP 387.5 10/04/2013             Assessment & Plan:

## 2014-02-12 NOTE — Progress Notes (Signed)
Quick Note:  Spoke with pt and notified of results per Dr. [name]. Pt verbalized understanding and denied any questions.  ______ 

## 2014-02-13 NOTE — Assessment & Plan Note (Signed)
02/12/2014  Walked RA x 3 laps @ 185 ft each stopped due to end of study, no sob and no cp/ no ekg changes p ex   Concerned about angina based on hx but unable to reproduce any changes here, defer eval and w/u to primary care

## 2014-02-13 NOTE — Assessment & Plan Note (Signed)
-  Dx by TBBX/ pos anca 03/2010 > CTX started    - Off all prednisone with baseline esr 31 11/16/2010  > restarted 12/16/2010 with nausea and wt down to 115 but ok sats walking x 3 laps   - Recheck ANCA sent  12/16/2010  >  neg   -Trial off cytoxan starting 02/12/2011 >   03/16/11 stopped  - Prednisone ceiling is 10 mg per day and floor is 5 mg daily as of 04/26/13  - 08/19/13 ANCA POS 1:80 with increasing nodular dz > rx 08/21/2013 cytoxan 50 mg daily  - Started bactrim ds one daily 10/07/13 - ESR up 11/07/13 rec double pred to 20 mg until feeling better then floor of 10 mg > esr 48  02/12/14 on 10 mg pred daily   Adequate control on present rx, reviewed > no change in rx needed   Comment: His main symptoms are from bronchiectasis assoc with airflow obst at this point and not active WG with nodules less prominent on cxr and tol rx well so will focus on bronchiectasis

## 2014-02-14 ENCOUNTER — Ambulatory Visit: Payer: Medicare Other | Admitting: Internal Medicine

## 2014-02-17 ENCOUNTER — Telehealth: Payer: Self-pay | Admitting: Internal Medicine

## 2014-02-17 NOTE — Telephone Encounter (Signed)
I called spoke with Morey Hummingbird. She reports they send over the documentation needed for pt vest. Please advise leslie if this was received thanks

## 2014-02-17 NOTE — Telephone Encounter (Signed)
This was received and given to [facility] after MW signed it this am  Will go ahead and close encounter

## 2014-02-18 ENCOUNTER — Encounter: Payer: Self-pay | Admitting: Family Medicine

## 2014-02-18 ENCOUNTER — Ambulatory Visit (INDEPENDENT_AMBULATORY_CARE_PROVIDER_SITE_OTHER): Payer: Medicare Other | Admitting: Family Medicine

## 2014-02-18 VITALS — BP 128/63 | HR 93 | Temp 98.8°F | Ht 67.0 in | Wt 124.0 lb

## 2014-02-18 DIAGNOSIS — R079 Chest pain, unspecified: Secondary | ICD-10-CM

## 2014-02-18 DIAGNOSIS — R0602 Shortness of breath: Secondary | ICD-10-CM

## 2014-02-18 DIAGNOSIS — I10 Essential (primary) hypertension: Secondary | ICD-10-CM

## 2014-02-18 DIAGNOSIS — Z87898 Personal history of other specified conditions: Secondary | ICD-10-CM

## 2014-02-18 MED ORDER — TAMSULOSIN HCL 0.4 MG PO CAPS: 0.4000 mg | ORAL_CAPSULE | Freq: Every evening | ORAL | Status: DC

## 2014-02-18 NOTE — Progress Notes (Signed)
Pre visit review using our clinic review tool, if applicable. No additional management support is needed unless otherwise documented below in the visit note. 

## 2014-02-18 NOTE — Progress Notes (Signed)
   Subjective:    Patient ID: Richard [name], male    DOB: September 29, 1935, 78 y.o.   MRN: [id number]  HPI Here to follow up. He was referred to Dr. Risa Grill for an elevated PSA of 22 (while on Avadart) and it seemed extremely likely that he had prostate cancer. However his prostate biopsy returned as benign. He was continued on his usual Avadart and Flomax, and he was to follow up with Dr. Risa Grill in 6 months. He then saw Dr. [name] for his Wegeners granulomatosis, and his CXR was stable. However he mentioned to Dr. [name] that he has been having exertional chest pains and he was told to see [name] about this. In fact for several months he has had mild chest pains and SOB on exertion, such as when walking across a parking lot. This resolves promptly with rest.    Review of Systems  Constitutional: Negative.   Respiratory: Positive for chest tightness and shortness of breath. Negative for cough and wheezing.   Cardiovascular: Positive for chest pain. Negative for palpitations and leg swelling.       Objective:   Physical Exam  Constitutional: He appears well-developed and well-nourished.  Cardiovascular: Normal rate, regular rhythm, normal heart sounds and intact distal pulses.   Pulmonary/Chest: Effort normal and breath sounds normal. No respiratory distress. He has no wheezes. He has no rales.          Assessment & Plan:  This is concerning for possible cardiac ischemia, so we will refer him to Cardiology to assess.

## 2014-02-20 ENCOUNTER — Other Ambulatory Visit: Payer: Self-pay | Admitting: Internal Medicine

## 2014-03-05 ENCOUNTER — Ambulatory Visit (INDEPENDENT_AMBULATORY_CARE_PROVIDER_SITE_OTHER): Payer: Medicare Other | Admitting: Internal Medicine

## 2014-03-05 ENCOUNTER — Encounter: Payer: Self-pay | Admitting: Internal Medicine

## 2014-03-05 VITALS — BP 148/80 | HR 93 | Ht 67.0 in | Wt 124.3 lb

## 2014-03-05 DIAGNOSIS — I498 Other specified cardiac arrhythmias: Secondary | ICD-10-CM

## 2014-03-05 DIAGNOSIS — I10 Essential (primary) hypertension: Secondary | ICD-10-CM

## 2014-03-05 DIAGNOSIS — E785 Hyperlipidemia, unspecified: Secondary | ICD-10-CM

## 2014-03-05 DIAGNOSIS — I471 Supraventricular tachycardia, unspecified: Secondary | ICD-10-CM

## 2014-03-05 DIAGNOSIS — R0602 Shortness of breath: Secondary | ICD-10-CM

## 2014-03-05 DIAGNOSIS — R531 Weakness: Secondary | ICD-10-CM

## 2014-03-05 DIAGNOSIS — R5383 Other fatigue: Secondary | ICD-10-CM

## 2014-03-05 DIAGNOSIS — M3130 Wegener's granulomatosis without renal involvement: Secondary | ICD-10-CM

## 2014-03-05 DIAGNOSIS — R5381 Other malaise: Secondary | ICD-10-CM

## 2014-03-05 DIAGNOSIS — R0789 Other chest pain: Secondary | ICD-10-CM

## 2014-03-05 NOTE — Patient Instructions (Signed)
Your physician has requested that you have a lexiscan myoview. For further information please visit [email or web address]. Please follow instruction sheet, as given.  Your physician recommends that you schedule a follow-up appointment after your stress test.   

## 2014-03-06 ENCOUNTER — Encounter: Payer: Self-pay | Admitting: Internal Medicine

## 2014-03-06 NOTE — Progress Notes (Signed)
OFFICE NOTE  Chief Complaint:  Chest pressure, dypsnea  Primary Care Physician: [name], MD  HPI:  Richard [name] is a 78 year old male with a history of Wegener's granulomatosis but no other cardiac risk factors. He has been followed by Dr. [name] for shortness of breath. He's been maintained on long-term prednisone and Cytoxan. He frequently takes antibiotics for pulmonary infections. He also has a history of SVT which spontaneously converted back to sinus. Recently he's been describing some shortness of breath and pressure in his chest. This is to be slightly worse with exertion and relieved by rest. He did not describe any chest pain and has not noted a significant change in his exercise tolerance.  He does have a brother with a history of coronary artery disease who had bypass surgery however he is now deceased. His mother had heart disease as well.  PMHx:  Past Medical History  Diagnosis Date  . Allergy   . GERD (gastroesophageal reflux disease)   . Arthritis   . Asthma   . Fungal infection     lungs  . Cataract   . Ulcer   . BPH (benign prostatic hyperplasia)     sees Dr. Risa Grill, biopsy June 2015 was benign   . Wegener's granulomatosis 2011    sees Dr. [name]   . Peptic stricture of esophagus   . Anemia   . Hiatal hernia   . Adenomatous colon polyp 08/2011  . Bronchiectasis   . On home oxygen therapy 12-18-12    uses 2 l/m nasally at bedtime  . HOH (hard of hearing) 12-18-12    bilaterally  . HBP (high blood pressure) 09/28/2013    Past Surgical History  Procedure Laterality Date  . Hernia repair    . Eye surgery      cataracts removed.   . Colonoscopy  08-16-11    per Dr. Fuller Plan, diverticulosis and polyps, repeat in 5 yrs   . Esophagogastroduodenoscopy (egd) with esophageal dilation  11-29-10    per Dr. Fuller Plan   . Cataract extraction, bilateral  12-18-12    bilateral  . Cholecystectomy N/A 12/21/2012    Procedure: LAPAROSCOPIC CHOLECYSTECTOMY WITH  INTRAOPERATIVE CHOLANGIOGRAM;  Surgeon: Edward Jolly, MD;  Location: [facility];  Service: General;  Laterality: N/A;    FAMHx:  Family History  Problem Relation Age of Onset  . Asthma Father   . Coronary artery disease Brother     SOCHx:   reports that he has never smoked. He has never used smokeless tobacco. He reports that he does not drink alcohol or use illicit drugs.  ALLERGIES:  Allergies  Allergen Reactions  . Factive [Gemifloxacin]     Ineffective     ROS: A comprehensive review of systems was negative except for: Respiratory: positive for dyspnea on exertion Cardiovascular: positive for chest pressure/discomfort  HOME MEDS: Current Outpatient Prescriptions  Medication Sig Dispense Refill  . acetaminophen (TYLENOL) 325 MG tablet Take 650 mg by mouth every 6 (six) hours as needed for mild pain.      Marland Kitchen aspirin 81 MG tablet Take 81 mg by mouth every evening.       Marland Kitchen b complex vitamins tablet Take 1 tablet by mouth daily.        . bisacodyl (DULCOLAX) 5 MG EC tablet Take 5 mg by mouth daily as needed. CONSTIPATION      . budesonide-formoterol (SYMBICORT) 160-4.5 MCG/ACT inhaler Inhale 2 puffs into the lungs 2 (two) times daily.      Marland Kitchen  ciprofloxacin (CIPRO) 750 MG tablet Take 750 mg by mouth 2 (two) times daily.      . cyclophosphamide (CYTOXAN) 50 MG tablet Take 1 tablet (50 mg total) by mouth daily. Give on an empty stomach 1 hour before or 2 hours after meals.  30 tablet  3  . dextromethorphan (DELSYM) 30 MG/5ML liquid Take 30 mg by mouth 2 (two) times daily as needed for cough.      . dextromethorphan-guaiFENesin (MUCINEX DM) 30-600 MG per 12 hr tablet Take 1 tablet by mouth 2 (two) times daily.       Marland Kitchen dutasteride (AVODART) 0.5 MG capsule Take 0.5 mg by mouth every other day. In the evening.      Marland Kitchen esomeprazole (NEXIUM) 20 MG capsule Take 20 mg by mouth 2 (two) times daily before a meal.       . levalbuterol (XOPENEX HFA) 45 MCG/ACT inhaler Inhale 1-2 puffs into the  lungs every 4 (four) hours as needed for wheezing.  1 Inhaler  12  . loratadine (CLARITIN) 10 MG tablet Take 10 mg by mouth daily.       . methylcellulose (CITRUCEL) oral powder Take 1 packet by mouth daily.      . predniSONE (DELTASONE) 10 MG tablet Take 10 mg by mouth daily with breakfast.      . Simethicone (GAS-X PO) Take 2 tablets by mouth 2 (two) times daily.       . tamsulosin (FLOMAX) 0.4 MG CAPS capsule Take 1 capsule (0.4 mg total) by mouth every evening.  30 capsule  11  . traMADol (ULTRAM) 50 MG tablet 1-2 every 4 hours as needed for cough or pain  40 tablet  0   No current facility-administered medications for this visit.    LABS/IMAGING: No results found for this or any previous visit (from the past 48 hour(s)). No results found.  VITALS: BP 148/80  Pulse 93  Ht 5\' 7"  (1.702 m)  Wt 124 lb 4.8 oz (56.382 kg)  BMI 19.46 kg/m2  EXAM: General appearance: alert and no distress Neck: no carotid bruit and no JVD Lungs: diminished breath sounds bilaterally Heart: regular rate and rhythm, S1, S2 normal, no murmur, click, rub or gallop Abdomen: soft, non-tender; bowel sounds normal; no masses,  no organomegaly Extremities: extremities normal, atraumatic, no cyanosis or edema Pulses: 2+ and symmetric Skin: Skin color, texture, turgor normal. No rashes or lesions Neurologic: Grossly normal Psych: Normal  EKG: Normal sinus rhythm at 93  ASSESSMENT: 1. Exertional chest pressure 2. Dyspnea on exertion 3. History of Wegener's granulomatosis on immunosuppression  PLAN: 1.   Mr. Callow has a history of Wegener's disease which typically causes a small vessel vasculitis. There is not a significant literature on associated cardiovascular disease however he has been chronically immunosuppressed. He is chest pressure I believe is more related to shortness of breath with exertion. He describes it as a heaviness or inability to actually take a deep breath. Nonetheless, it would be  reasonable to consider functional testing to rule out ischemia given his age and family history of heart disease.  I would recommend a LexiScan nuclear stress test and will go over results of that study when he returns.  Thanks for the kind referral.  Pixie Casino, MD, [facility] Attending Cardiologist [facility]  Chanti Golubski C 03/06/2014, 2:32 PM

## 2014-03-07 ENCOUNTER — Telehealth (HOSPITAL_COMMUNITY): Payer: Self-pay

## 2014-03-07 NOTE — Telephone Encounter (Signed)
Encounter complete. 

## 2014-03-08 HISTORY — PX: LUNG BIOPSY: SHX232

## 2014-03-12 ENCOUNTER — Ambulatory Visit (HOSPITAL_BASED_OUTPATIENT_CLINIC_OR_DEPARTMENT_OTHER)
Admission: RE | Admit: 2014-03-12 | Discharge: 2014-03-12 | Disposition: A | Discharge status: Home / Self Care | Payer: Medicare Other | Source: Ambulatory Visit | Attending: Cardiology | Admitting: Cardiology

## 2014-03-12 DIAGNOSIS — R0789 Other chest pain: Secondary | ICD-10-CM

## 2014-03-12 DIAGNOSIS — I2510 Atherosclerotic heart disease of native coronary artery without angina pectoris: Secondary | ICD-10-CM | POA: Diagnosis not present

## 2014-03-12 DIAGNOSIS — R9439 Abnormal result of other cardiovascular function study: Secondary | ICD-10-CM | POA: Diagnosis present

## 2014-03-12 DIAGNOSIS — R0602 Shortness of breath: Secondary | ICD-10-CM

## 2014-03-12 MED ORDER — TECHNETIUM TC 99M SESTAMIBI GENERIC - CARDIOLITE
10.4000 | Freq: Once | INTRAVENOUS | Status: AC | PRN
  Administered 2014-03-12: 10 via INTRAVENOUS

## 2014-03-12 MED ORDER — AMINOPHYLLINE 25 MG/ML IV SOLN
125.0000 mg | Freq: Once | INTRAVENOUS | Status: AC
  Administered 2014-03-12: 125 mg via INTRAVENOUS

## 2014-03-12 MED ORDER — TECHNETIUM TC 99M SESTAMIBI GENERIC - CARDIOLITE
30.9000 | Freq: Once | INTRAVENOUS | Status: AC | PRN
  Administered 2014-03-12: 30.9 via INTRAVENOUS

## 2014-03-12 MED ORDER — REGADENOSON 0.4 MG/5ML IV SOLN
0.4000 mg | Freq: Once | INTRAVENOUS | Status: AC
Start: 2014-03-12 — End: 2014-03-12
  Administered 2014-03-12: 0.4 mg via INTRAVENOUS

## 2014-03-12 NOTE — Procedures (Addendum)
[place] [address] [place] [address] [phone]  Cardiology Nuclear Med Study  Richard [name] is a 78 y.o. male     MRN : [id number]     DOB: January 31, 1936  Procedure Date: 03/12/2014  Nuclear Med Background Indication for Stress Test:  Evaluation for Ischemia History:  Asthma and SVT;No prior NUC MPI for comparison. Cardiac Risk Factors: Family History - CAD, Hypertension and Lipids  Symptoms:  Chest Pain, Dizziness, DOE, Fatigue, Light-Headedness, Palpitations and SOB   Nuclear Pre-Procedure Caffeine/Decaff Intake:  1:00am NPO After: 11am   IV Site: R Forearm  IV 0.9% NS with Angio Cath:  22g  Chest Size (in):  34"  IV Started by: Rolene Course, RN  Height: 5\' 7"  (1.702 m)  Cup Size: n/a  BMI:  Body mass index is 19.42 kg/(m^2). Weight:  124 lb (56.246 kg)   Tech Comments:  n/a    Nuclear Med Study 1 or 2 day study: 1 day  Stress Test Type:  [place] Authorizing Provider:  Lyman Bishop, MD   Resting Radionuclide: Technetium 31m Sestamibi  Resting Radionuclide Dose: 10.4 mCi   Stress Radionuclide:  Technetium 47m Sestamibi  Stress Radionuclide Dose: 30.9 mCi           Stress Protocol Rest HR: 115 Stress HR: 123  Rest BP: 108/76 Stress BP: 137/70  Exercise Time (min): n/a METS: n/a          Dose of Adenosine (mg):  n/a Dose of Lexiscan: 0.4 mg  Dose of Atropine (mg): n/a Dose of Dobutamine: n/a mcg/kg/min (at max HR)  Stress Test Technologist: Mellody Memos, CCT Nuclear Technologist: Imagene Riches, CNMT   Rest Procedure:  Myocardial perfusion imaging was performed at rest 45 minutes following the intravenous administration of Technetium 1m Sestamibi. Stress Procedure:  The patient received IV Lexiscan 0.4 mg over 15-seconds.  Technetium 58m Sestamibi injected IV at 30-seconds.  Patient experienced shortness of breath and was administered 125 mg of Aminophylline IV at 5 minutes. There were no  significant changes with Lexiscan.  Quantitative spect images were obtained after a 45 minute delay.  Transient Ischemic Dilatation (Normal <1.22):  1.15  QGS EDV:  75 ml QGS ESV:  45 ml LV Ejection Fraction: 40%      Rest ECG: Sinus Tachycardia with nondiagnostic inferior Q waves  Stress ECG: No significant change from baseline ECG  QPS Raw Data Images:  Normal; no motion artifact; normal heart/lung ratio. Stress Images:  Moderate apical and mild inferior defect Rest Images:  Comparison with the stress images reveals similar defect locations with mildly improved perfusion Subtraction (SDS):  Scar with mild peri-infarct ischemia.  Impression Exercise Capacity:  Lexiscan with no exercise. BP Response:  Mildly decreased BP in the immediate recovery period. Clinical Symptoms:  No significant symptoms noted. ECG Impression:  No significant ST segment change suggestive of ischemia. Comparison with Prior Nuclear Study: No previous nuclear study performed  Overall Impression:  Intermediate risk stress nuclear study demonstrating a moderate region of apical and inferior scar with mild superimposed qualitative ischemia.  LV Wall Motion:  Moderate hypokinesis involving the mid-distal inferior wall and apex with an EF of 40%.   Troy Sine, MD  03/12/2014 6:37 PM

## 2014-03-13 ENCOUNTER — Encounter (HOSPITAL_COMMUNITY): Payer: Self-pay | Admitting: Emergency Medicine

## 2014-03-13 ENCOUNTER — Emergency Department (HOSPITAL_COMMUNITY): Payer: Medicare Other

## 2014-03-13 ENCOUNTER — Inpatient Hospital Stay (HOSPITAL_COMMUNITY)
Admission: EM | Admit: 2014-03-13 | Discharge: 2014-03-26 | DRG: 249 | Disposition: A | Discharge status: Home / Self Care | Payer: Medicare Other | Attending: Internal Medicine | Admitting: Internal Medicine

## 2014-03-13 DIAGNOSIS — Z888 Allergy status to other drugs, medicaments and biological substances status: Secondary | ICD-10-CM | POA: Diagnosis not present

## 2014-03-13 DIAGNOSIS — D649 Anemia, unspecified: Secondary | ICD-10-CM | POA: Diagnosis present

## 2014-03-13 DIAGNOSIS — Z8249 Family history of ischemic heart disease and other diseases of the circulatory system: Secondary | ICD-10-CM

## 2014-03-13 DIAGNOSIS — D62 Acute posthemorrhagic anemia: Secondary | ICD-10-CM

## 2014-03-13 DIAGNOSIS — N183 Chronic kidney disease, stage 3 unspecified: Secondary | ICD-10-CM | POA: Diagnosis present

## 2014-03-13 DIAGNOSIS — IMO0002 Reserved for concepts with insufficient information to code with codable children: Secondary | ICD-10-CM | POA: Diagnosis not present

## 2014-03-13 DIAGNOSIS — E785 Hyperlipidemia, unspecified: Secondary | ICD-10-CM | POA: Diagnosis present

## 2014-03-13 DIAGNOSIS — Z87898 Personal history of other specified conditions: Secondary | ICD-10-CM

## 2014-03-13 DIAGNOSIS — M3130 Wegener's granulomatosis without renal involvement: Secondary | ICD-10-CM | POA: Diagnosis present

## 2014-03-13 DIAGNOSIS — Z955 Presence of coronary angioplasty implant and graft: Secondary | ICD-10-CM

## 2014-03-13 DIAGNOSIS — R0602 Shortness of breath: Secondary | ICD-10-CM | POA: Diagnosis present

## 2014-03-13 DIAGNOSIS — I200 Unstable angina: Secondary | ICD-10-CM | POA: Diagnosis present

## 2014-03-13 DIAGNOSIS — I2582 Chronic total occlusion of coronary artery: Secondary | ICD-10-CM | POA: Diagnosis present

## 2014-03-13 DIAGNOSIS — I509 Heart failure, unspecified: Secondary | ICD-10-CM | POA: Diagnosis present

## 2014-03-13 DIAGNOSIS — R64 Cachexia: Secondary | ICD-10-CM | POA: Diagnosis present

## 2014-03-13 DIAGNOSIS — Z825 Family history of asthma and other chronic lower respiratory diseases: Secondary | ICD-10-CM

## 2014-03-13 DIAGNOSIS — Z681 Body mass index (BMI) 19 or less, adult: Secondary | ICD-10-CM | POA: Diagnosis not present

## 2014-03-13 DIAGNOSIS — I498 Other specified cardiac arrhythmias: Secondary | ICD-10-CM

## 2014-03-13 DIAGNOSIS — R5383 Other fatigue: Secondary | ICD-10-CM

## 2014-03-13 DIAGNOSIS — I209 Angina pectoris, unspecified: Secondary | ICD-10-CM | POA: Diagnosis present

## 2014-03-13 DIAGNOSIS — J9611 Chronic respiratory failure with hypoxia: Secondary | ICD-10-CM

## 2014-03-13 DIAGNOSIS — R9439 Abnormal result of other cardiovascular function study: Secondary | ICD-10-CM | POA: Diagnosis present

## 2014-03-13 DIAGNOSIS — D899 Disorder involving the immune mechanism, unspecified: Secondary | ICD-10-CM | POA: Diagnosis present

## 2014-03-13 DIAGNOSIS — R0600 Dyspnea, unspecified: Secondary | ICD-10-CM

## 2014-03-13 DIAGNOSIS — Z7982 Long term (current) use of aspirin: Secondary | ICD-10-CM

## 2014-03-13 DIAGNOSIS — I129 Hypertensive chronic kidney disease with stage 1 through stage 4 chronic kidney disease, or unspecified chronic kidney disease: Secondary | ICD-10-CM | POA: Diagnosis present

## 2014-03-13 DIAGNOSIS — I471 Supraventricular tachycardia: Secondary | ICD-10-CM

## 2014-03-13 DIAGNOSIS — R0609 Other forms of dyspnea: Secondary | ICD-10-CM

## 2014-03-13 DIAGNOSIS — R0989 Other specified symptoms and signs involving the circulatory and respiratory systems: Secondary | ICD-10-CM

## 2014-03-13 DIAGNOSIS — I724 Aneurysm of artery of lower extremity: Secondary | ICD-10-CM | POA: Diagnosis present

## 2014-03-13 DIAGNOSIS — I429 Cardiomyopathy, unspecified: Secondary | ICD-10-CM

## 2014-03-13 DIAGNOSIS — I5022 Chronic systolic (congestive) heart failure: Secondary | ICD-10-CM | POA: Diagnosis present

## 2014-03-13 DIAGNOSIS — J471 Bronchiectasis with (acute) exacerbation: Secondary | ICD-10-CM

## 2014-03-13 DIAGNOSIS — N400 Enlarged prostate without lower urinary tract symptoms: Secondary | ICD-10-CM | POA: Diagnosis present

## 2014-03-13 DIAGNOSIS — E039 Hypothyroidism, unspecified: Secondary | ICD-10-CM | POA: Diagnosis present

## 2014-03-13 DIAGNOSIS — Z9861 Coronary angioplasty status: Secondary | ICD-10-CM

## 2014-03-13 DIAGNOSIS — H9190 Unspecified hearing loss, unspecified ear: Secondary | ICD-10-CM | POA: Diagnosis present

## 2014-03-13 DIAGNOSIS — J479 Bronchiectasis, uncomplicated: Secondary | ICD-10-CM

## 2014-03-13 DIAGNOSIS — I999 Unspecified disorder of circulatory system: Secondary | ICD-10-CM | POA: Diagnosis not present

## 2014-03-13 DIAGNOSIS — I2510 Atherosclerotic heart disease of native coronary artery without angina pectoris: Principal | ICD-10-CM | POA: Diagnosis present

## 2014-03-13 DIAGNOSIS — J9610 Chronic respiratory failure, unspecified whether with hypoxia or hypercapnia: Secondary | ICD-10-CM | POA: Diagnosis present

## 2014-03-13 DIAGNOSIS — D638 Anemia in other chronic diseases classified elsewhere: Secondary | ICD-10-CM | POA: Diagnosis present

## 2014-03-13 DIAGNOSIS — R5381 Other malaise: Secondary | ICD-10-CM

## 2014-03-13 DIAGNOSIS — Y840 Cardiac catheterization as the cause of abnormal reaction of the patient, or of later complication, without mention of misadventure at the time of the procedure: Secondary | ICD-10-CM | POA: Diagnosis not present

## 2014-03-13 DIAGNOSIS — I428 Other cardiomyopathies: Secondary | ICD-10-CM

## 2014-03-13 DIAGNOSIS — R079 Chest pain, unspecified: Secondary | ICD-10-CM

## 2014-03-13 DIAGNOSIS — I255 Ischemic cardiomyopathy: Secondary | ICD-10-CM | POA: Diagnosis present

## 2014-03-13 DIAGNOSIS — I10 Essential (primary) hypertension: Secondary | ICD-10-CM

## 2014-03-13 HISTORY — DX: Atherosclerotic heart disease of native coronary artery without angina pectoris: I25.10

## 2014-03-13 HISTORY — DX: Aneurysm of artery of lower extremity: I72.4

## 2014-03-13 HISTORY — DX: Essential (primary) hypertension: I10

## 2014-03-13 LAB — CBC WITH DIFFERENTIAL/PLATELET
Basophils Absolute: 0 10*3/uL (ref 0.0–0.1)
Basophils Relative: 0 % (ref 0–1)
EOS PCT: 1 % (ref 0–5)
Eosinophils Absolute: 0.1 10*3/uL (ref 0.0–0.7)
HCT: 27.4 % — ABNORMAL LOW (ref 39.0–52.0)
Hemoglobin: 9 g/dL — ABNORMAL LOW (ref 13.0–17.0)
LYMPHS ABS: 0.4 10*3/uL — AB (ref 0.7–4.0)
Lymphocytes Relative: 4 % — ABNORMAL LOW (ref 12–46)
MCH: 33 pg (ref 26.0–34.0)
MCHC: 32.8 g/dL (ref 30.0–36.0)
MCV: 100.4 fL — ABNORMAL HIGH (ref 78.0–100.0)
MONO ABS: 0.4 10*3/uL (ref 0.1–1.0)
Monocytes Relative: 4 % (ref 3–12)
NEUTROS PCT: 91 % — AB (ref 43–77)
Neutro Abs: 7.9 10*3/uL — ABNORMAL HIGH (ref 1.7–7.7)
PLATELETS: 258 10*3/uL (ref 150–400)
RBC: 2.73 MIL/uL — AB (ref 4.22–5.81)
RDW: 15.8 % — AB (ref 11.5–15.5)
WBC: 8.8 10*3/uL (ref 4.0–10.5)

## 2014-03-13 LAB — BASIC METABOLIC PANEL
Anion gap: 13 (ref 5–15)
BUN: 26 mg/dL — AB (ref 6–23)
CO2: 23 meq/L (ref 19–32)
Calcium: 8.5 mg/dL (ref 8.4–10.5)
Chloride: 101 mEq/L (ref 96–112)
Creatinine, Ser: 1.85 mg/dL — ABNORMAL HIGH (ref 0.50–1.35)
GFR calc Af Amer: 39 mL/min — ABNORMAL LOW (ref >90)
GFR, EST NON AFRICAN AMERICAN: 34 mL/min — AB (ref >90)
GLUCOSE: 113 mg/dL — AB (ref 70–99)
Potassium: 4.3 mEq/L (ref 3.7–5.3)
SODIUM: 137 meq/L (ref 137–147)

## 2014-03-13 LAB — TROPONIN I

## 2014-03-13 LAB — PROTIME-INR
INR: 1.15 (ref 0.00–1.49)
PROTHROMBIN TIME: 14.7 s (ref 11.6–15.2)

## 2014-03-13 LAB — HEMOGLOBIN A1C
HEMOGLOBIN A1C: 5.3 % (ref <5.7)
Mean Plasma Glucose: 105 mg/dL (ref <117)

## 2014-03-13 LAB — I-STAT TROPONIN, ED: TROPONIN I, POC: 0.01 ng/mL (ref 0.00–0.08)

## 2014-03-13 LAB — APTT: aPTT: 36 seconds (ref 24–37)

## 2014-03-13 LAB — MAGNESIUM: MAGNESIUM: 2.2 mg/dL (ref 1.5–2.5)

## 2014-03-13 MED ORDER — DUTASTERIDE 0.5 MG PO CAPS
0.5000 mg | ORAL_CAPSULE | ORAL | Status: DC
  Administered 2014-03-14 – 2014-03-24 (×6): 0.5 mg via ORAL
  Filled 2014-03-13 (×7): qty 1

## 2014-03-13 MED ORDER — ASPIRIN 81 MG PO CHEW
324.0000 mg | CHEWABLE_TABLET | Freq: Once | ORAL | Status: AC
  Administered 2014-03-13: 324 mg via ORAL
  Filled 2014-03-13: qty 4

## 2014-03-13 MED ORDER — SIMVASTATIN 20 MG PO TABS
20.0000 mg | ORAL_TABLET | Freq: Every day | ORAL | Status: DC
  Administered 2014-03-13 – 2014-03-25 (×11): 20 mg via ORAL
  Filled 2014-03-13 (×16): qty 1

## 2014-03-13 MED ORDER — SODIUM CHLORIDE 0.9 % IJ SOLN: 3.0000 mL | INTRAMUSCULAR | Status: DC | PRN

## 2014-03-13 MED ORDER — SODIUM CHLORIDE 0.9 % IV SOLN
250.0000 mL | INTRAVENOUS | Status: DC | PRN
Start: 2014-03-13 — End: 2014-03-13

## 2014-03-13 MED ORDER — SODIUM CHLORIDE 0.9 % IV SOLN
INTRAVENOUS | Status: DC
  Administered 2014-03-15: 21:00:00 via INTRAVENOUS

## 2014-03-13 MED ORDER — HEPARIN BOLUS VIA INFUSION
2000.0000 [IU] | Freq: Once | INTRAVENOUS | Status: AC
  Administered 2014-03-13: 2000 [IU] via INTRAVENOUS
  Filled 2014-03-13: qty 2000

## 2014-03-13 MED ORDER — SODIUM CHLORIDE 0.9 % IJ SOLN: 3.0000 mL | Freq: Two times a day (BID) | INTRAMUSCULAR | Status: DC

## 2014-03-13 MED ORDER — TAMSULOSIN HCL 0.4 MG PO CAPS
0.4000 mg | ORAL_CAPSULE | Freq: Every evening | ORAL | Status: DC
  Administered 2014-03-13 – 2014-03-25 (×11): 0.4 mg via ORAL
  Filled 2014-03-13 (×17): qty 1

## 2014-03-13 MED ORDER — DEXTROMETHORPHAN POLISTIREX 30 MG/5ML PO LQCR
30.0000 mg | Freq: Two times a day (BID) | ORAL | Status: DC | PRN
  Filled 2014-03-13: qty 5

## 2014-03-13 MED ORDER — BISACODYL 5 MG PO TBEC
5.0000 mg | DELAYED_RELEASE_TABLET | Freq: Every day | ORAL | Status: DC | PRN
  Administered 2014-03-15: 5 mg via ORAL
  Filled 2014-03-13 (×2): qty 1

## 2014-03-13 MED ORDER — ACETAMINOPHEN 325 MG PO TABS: 650.0000 mg | ORAL_TABLET | Freq: Four times a day (QID) | ORAL | Status: DC | PRN

## 2014-03-13 MED ORDER — PSYLLIUM 95 % PO PACK
1.0000 | PACK | Freq: Every day | ORAL | Status: DC
  Administered 2014-03-15 – 2014-03-17 (×3): 1 via ORAL
  Administered 2014-03-18: 10:00:00 via ORAL
  Administered 2014-03-20 – 2014-03-26 (×6): 1 via ORAL
  Filled 2014-03-13 (×13): qty 1

## 2014-03-13 MED ORDER — HEPARIN (PORCINE) IN NACL 100-0.45 UNIT/ML-% IJ SOLN
1250.0000 [IU]/h | INTRAMUSCULAR | Status: DC
  Administered 2014-03-13: 1100 [IU]/h via INTRAVENOUS
  Filled 2014-03-13 (×4): qty 250

## 2014-03-13 MED ORDER — ZOLPIDEM TARTRATE 5 MG PO TABS: 5.0000 mg | ORAL_TABLET | Freq: Every evening | ORAL | Status: DC | PRN

## 2014-03-13 MED ORDER — BUDESONIDE-FORMOTEROL FUMARATE 160-4.5 MCG/ACT IN AERO
2.0000 | INHALATION_SPRAY | Freq: Two times a day (BID) | RESPIRATORY_TRACT | Status: DC
  Administered 2014-03-13 – 2014-03-26 (×21): 2 via RESPIRATORY_TRACT
  Filled 2014-03-13 (×7): qty 6

## 2014-03-13 MED ORDER — NITROGLYCERIN 0.4 MG SL SUBL: 0.4000 mg | SUBLINGUAL_TABLET | SUBLINGUAL | Status: DC | PRN

## 2014-03-13 MED ORDER — PREDNISONE 10 MG PO TABS
10.0000 mg | ORAL_TABLET | Freq: Every day | ORAL | Status: DC
  Administered 2014-03-14 – 2014-03-26 (×11): 10 mg via ORAL
  Filled 2014-03-13 (×18): qty 1

## 2014-03-13 MED ORDER — METHYLCELLULOSE (LAXATIVE) PO POWD: 1.0000 | Freq: Every day | ORAL | Status: DC

## 2014-03-13 MED ORDER — SODIUM CHLORIDE 0.9 % IV SOLN: 250.0000 mL | INTRAVENOUS | Status: DC | PRN

## 2014-03-13 MED ORDER — SULFAMETHOXAZOLE-TMP DS 800-160 MG PO TABS
1.0000 | ORAL_TABLET | Freq: Every day | ORAL | Status: DC
  Administered 2014-03-13 – 2014-03-25 (×12): 1 via ORAL
  Filled 2014-03-13 (×16): qty 1

## 2014-03-13 MED ORDER — ASPIRIN 81 MG PO CHEW
324.0000 mg | CHEWABLE_TABLET | ORAL | Status: AC
  Administered 2014-03-14: 324 mg via ORAL
  Filled 2014-03-13: qty 4

## 2014-03-13 MED ORDER — LEVALBUTEROL HCL 0.63 MG/3ML IN NEBU
0.6300 mg | INHALATION_SOLUTION | RESPIRATORY_TRACT | Status: DC | PRN
  Administered 2014-03-14: 0.63 mg via RESPIRATORY_TRACT
  Filled 2014-03-13 (×2): qty 3

## 2014-03-13 MED ORDER — ONDANSETRON HCL 4 MG/2ML IJ SOLN: 4.0000 mg | Freq: Four times a day (QID) | INTRAMUSCULAR | Status: DC | PRN

## 2014-03-13 MED ORDER — DM-GUAIFENESIN ER 30-600 MG PO TB12
1.0000 | ORAL_TABLET | Freq: Two times a day (BID) | ORAL | Status: DC
  Administered 2014-03-13 – 2014-03-26 (×25): 1 via ORAL
  Filled 2014-03-13 (×32): qty 1

## 2014-03-13 MED ORDER — CYCLOPHOSPHAMIDE 50 MG PO CAPS
50.0000 mg | ORAL_CAPSULE | Freq: Every day | ORAL | Status: DC
  Administered 2014-03-13 – 2014-03-22 (×9): 50 mg via ORAL
  Filled 2014-03-13 (×12): qty 1

## 2014-03-13 MED ORDER — CYCLOPHOSPHAMIDE 25 MG PO TABS
50.0000 mg | ORAL_TABLET | Freq: Every day | ORAL | Status: DC
  Filled 2014-03-13: qty 2
  Filled 2014-03-13: qty 1

## 2014-03-13 MED ORDER — SIMETHICONE 80 MG PO CHEW
160.0000 mg | CHEWABLE_TABLET | Freq: Two times a day (BID) | ORAL | Status: DC
  Administered 2014-03-13 – 2014-03-26 (×20): 160 mg via ORAL
  Filled 2014-03-13 (×32): qty 2

## 2014-03-13 MED ORDER — ALPRAZOLAM 0.25 MG PO TABS: 0.2500 mg | ORAL_TABLET | Freq: Two times a day (BID) | ORAL | Status: DC | PRN

## 2014-03-13 MED ORDER — B COMPLEX-C PO TABS
1.0000 | ORAL_TABLET | Freq: Every day | ORAL | Status: DC
  Administered 2014-03-15 – 2014-03-26 (×10): 1 via ORAL
  Filled 2014-03-13 (×13): qty 1

## 2014-03-13 MED ORDER — LORATADINE 10 MG PO TABS
10.0000 mg | ORAL_TABLET | Freq: Every day | ORAL | Status: DC
  Administered 2014-03-14 – 2014-03-26 (×11): 10 mg via ORAL
  Filled 2014-03-13 (×13): qty 1

## 2014-03-13 MED ORDER — ASPIRIN EC 81 MG PO TBEC
81.0000 mg | DELAYED_RELEASE_TABLET | Freq: Every evening | ORAL | Status: DC
  Filled 2014-03-13: qty 1

## 2014-03-13 MED ORDER — ACETAMINOPHEN 325 MG PO TABS
650.0000 mg | ORAL_TABLET | ORAL | Status: DC | PRN
  Administered 2014-03-17: 650 mg via ORAL
  Filled 2014-03-13 (×2): qty 2

## 2014-03-13 MED ORDER — NITROGLYCERIN 2 % TD OINT
0.5000 [in_us] | TOPICAL_OINTMENT | Freq: Four times a day (QID) | TRANSDERMAL | Status: DC
  Administered 2014-03-13 – 2014-03-14 (×4): 0.5 [in_us] via TOPICAL
  Filled 2014-03-13 (×3): qty 30

## 2014-03-13 MED ORDER — ASPIRIN EC 81 MG PO TBEC
81.0000 mg | DELAYED_RELEASE_TABLET | Freq: Every evening | ORAL | Status: DC
  Administered 2014-03-15 – 2014-03-16 (×2): 81 mg via ORAL
  Filled 2014-03-13 (×3): qty 1

## 2014-03-13 MED ORDER — SODIUM CHLORIDE 0.9 % IV SOLN: 1.0000 mL/kg/h | INTRAVENOUS | Status: DC

## 2014-03-13 MED ORDER — SODIUM CHLORIDE 0.9 % IJ SOLN
3.0000 mL | Freq: Two times a day (BID) | INTRAMUSCULAR | Status: DC
  Administered 2014-03-13 – 2014-03-17 (×4): 3 mL via INTRAVENOUS

## 2014-03-13 MED ORDER — PANTOPRAZOLE SODIUM 40 MG PO TBEC
40.0000 mg | DELAYED_RELEASE_TABLET | Freq: Every day | ORAL | Status: DC
  Administered 2014-03-13 – 2014-03-26 (×13): 40 mg via ORAL
  Filled 2014-03-13 (×14): qty 1

## 2014-03-13 MED ORDER — TRAMADOL HCL 50 MG PO TABS: 50.0000 mg | ORAL_TABLET | Freq: Four times a day (QID) | ORAL | Status: DC | PRN

## 2014-03-13 NOTE — Progress Notes (Signed)
Dr. Percival [name] felt patient should be transferred to [facility] and put on for cath. Have contacted [facility] and written orders. He is on for cath in a.m.

## 2014-03-13 NOTE — ED Notes (Addendum)
Pt returned from xray. MD at bedside at present time. Pt reports worsening SOB with exertion over past few months with productive cough. Pt denies CP.

## 2014-03-13 NOTE — ED Notes (Signed)
Per Barrett PA administer aspirin, nitro, and heparin. PA aware of pt blood pressure and ordered ONLY half an inch.

## 2014-03-13 NOTE — Progress Notes (Signed)
ANTICOAGULATION CONSULT NOTE - Initial Consult  Pharmacy Consult for Heparin Indication: chest pain/ACS  Allergies  Allergen Reactions  . Factive [Gemifloxacin]     Ineffective     Patient Measurements: Height: 5\' 7"  (170.2 cm) Weight: 124 lb (56.246 kg) IBW/kg (Calculated) : 66.1 Heparin Dosing Weight: 56.2 kg  Vital Signs: Temp: 99.5 F (37.5 C) (08/06 1108) Temp src: Oral (08/06 1108) BP: 120/67 mmHg (08/06 1308) Pulse Rate: 107 (08/06 1303)  Labs:  Recent Labs  03/13/14 1159  HGB 9.0*  HCT 27.4*  PLT 258  CREATININE 1.85*    Estimated Creatinine Clearance: 27.4 ml/min (by C-G formula based on Cr of 1.85).   Medical History: Past Medical History  Diagnosis Date  . Allergy   . GERD (gastroesophageal reflux disease)   . Arthritis   . Asthma   . Fungal infection     lungs  . Cataract   . Ulcer   . BPH (benign prostatic hyperplasia)     sees Dr. Risa Grill, biopsy June 2015 was benign   . Wegener's granulomatosis 2011    sees Dr. [name]   . Peptic stricture of esophagus   . Anemia   . Hiatal hernia   . Adenomatous colon polyp 08/2011  . Bronchiectasis   . On home oxygen therapy 12-18-12    uses 2 l/m nasally at bedtime  . HOH (hard of hearing) 12-18-12    bilaterally  . HBP (high blood pressure) 09/28/2013    Medications:  Scheduled:  . aspirin  324 mg Oral Once  . nitroGLYCERIN  0.5 inch Topical 4 times per day   Infusions:  . heparin    . heparin     PRN:   Assessment: 78 yo male with Wegener's granulomatatosis presents with shortness of breath. Had a myoview 8/5 which demonstrated moderate qualitative ischemia, EF 40% and moderate hypokinesis of the mid-distal inferiour wall. . Initial troponin in ED is negative.  Pharmacy is consulted to dose IV heparin for ACS / STEMI.   Baseline pTT, PT/INR pending  Hgb low 9.0 but c/w previous results, Plts wnl  SCr elevated 1.85, baseline values ~1.3-1.5  Goal of Therapy:  Heparin level 0.3-0.7  units/ml Monitor platelets by anticoagulation protocol: Yes   Plan:   Heparin 2000 units IV bolus, then  Heparin 1100 units/hr continuous infusion  Check heparin level in 8hrs  Daily heparin level and CBC  Peggyann Juba, PharmD, BCPS Pager: [phone] 03/13/2014,1:48 PM

## 2014-03-13 NOTE — ED Notes (Signed)
Phlebotomy at bedside.

## 2014-03-13 NOTE — ED Provider Notes (Addendum)
CSN: [id number]     Arrival date & time 03/13/14  1057 History   First MD Initiated Contact with Patient 03/13/14 1117     Chief Complaint  Patient presents with  . Shortness of Breath     HPI  Patient presents 2/2 shortness of breath. He has some mild-moderate chronic shortness of breath secondary to lung disease secondary to bronchiectasis and Wegener's granulomatosis. He sees Dr. Christinia Gully at [facility].  Is prednisone dependent. Currently on 10mg /day.  Is on chronic Cytoxan since Jan 2015.   Is a lifetime non-smoker. Does not wear oxygen at home. Had one prior episode of SVT. No other known cardiac disease. This morning he was short of breath. He felt almost to weak and short of breath to eat  breakfast. He walked a short distance to lay down. He was short of breath and described tightness in his chest. No pressure pain or tightness now. His symptoms resolved.  He is otherwise at his baseline with his pulmonary disease. He has chronic daily yellow sputum. No increase in quality or quantity of sputum. No change in color. No hemoptysis. No fever. No lower extremity edema. No risk of PE or DVT. Brother with cardiac disease.  I have reviewed his Pulmonary notes.   Dr. [name] has querried as to whether some of his symptoms may be 2/2 ischemic heart disease.  He followed up with cardiology and had an out-patient myoview yesterday.        Myoscan: Overall Impression: Intermediate risk stress nuclear study demonstrating a moderate                         region of apical and inferior scar with mild superimposed qualitative ischemia.                    LV Wall Motion: Moderate hypokinesis involving the mid-distal inferior wall and apex with an                   EF of 40%.        Past Medical History  Diagnosis Date  . Allergy   . GERD (gastroesophageal reflux disease)   . Arthritis   . Asthma   . Fungal infection     lungs  . Cataract   . Ulcer   . BPH (benign prostatic hyperplasia)      sees Dr. Risa Grill, biopsy June 2015 was benign   . Wegener's granulomatosis 2011    sees Dr. [name]   . Peptic stricture of esophagus   . Anemia   . Hiatal hernia   . Adenomatous colon polyp 08/2011  . Bronchiectasis   . On home oxygen therapy 12-18-12    uses 2 l/m nasally at bedtime  . HOH (hard of hearing) 12-18-12    bilaterally  . HBP (high blood pressure) 09/28/2013   Past Surgical History  Procedure Laterality Date  . Hernia repair    . Eye surgery      cataracts removed.   . Colonoscopy  08-16-11    per Dr. Fuller Plan, diverticulosis and polyps, repeat in 5 yrs   . Esophagogastroduodenoscopy (egd) with esophageal dilation  11-29-10    per Dr. Fuller Plan   . Cataract extraction, bilateral  12-18-12    bilateral  . Cholecystectomy N/A 12/21/2012    Procedure: LAPAROSCOPIC CHOLECYSTECTOMY WITH INTRAOPERATIVE CHOLANGIOGRAM;  Surgeon: Edward Jolly, MD;  Location: [facility];  Service: General;  Laterality: N/A;  .  Hematoma evacuation Right 03/19/2014    Procedure: Suture repair of femoral artery with evacuation of hematoma;  Surgeon: Mal Misty, MD;  Location: [facility] OR;  Service: Vascular;  Laterality: Right;   Family History  Problem Relation Age of Onset  . Asthma Father   . Coronary artery disease Brother   . Coronary artery disease Brother   . Coronary artery disease Mother    History  Substance Use Topics  . Smoking status: Never Smoker   . Smokeless tobacco: Never Used  . Alcohol Use: No    Review of Systems  Constitutional: Positive for fatigue. Negative for fever, chills, diaphoresis and appetite change.  HENT: Negative for mouth sores, sore throat and trouble swallowing.   Eyes: Negative for visual disturbance.  Respiratory: Positive for chest tightness and shortness of breath. Negative for cough and wheezing.   Cardiovascular: Negative for chest pain.  Gastrointestinal: Negative for nausea, vomiting, abdominal pain, diarrhea and abdominal distention.  Endocrine:  Negative for polydipsia, polyphagia and polyuria.  Genitourinary: Negative for dysuria, frequency and hematuria.  Musculoskeletal: Negative for gait problem.  Skin: Negative for color change, pallor and rash.  Neurological: Positive for weakness. Negative for dizziness, syncope, light-headedness and headaches.  Hematological: Does not bruise/bleed easily.  Psychiatric/Behavioral: Negative for behavioral problems and confusion.      Allergies  Factive  Home Medications   Prior to Admission medications   Medication Sig Start Date End Date Taking? Authorizing Provider  acetaminophen (TYLENOL) 325 MG tablet Take 650 mg by mouth every 6 (six) hours as needed for mild pain.   Yes Historical Provider, MD  aspirin 81 MG tablet Take 81 mg by mouth every evening.    Yes Historical Provider, MD  b complex vitamins tablet Take 1 tablet by mouth daily.     Yes Historical Provider, MD  BIOTIN PO Take 1 tablet by mouth 3 (three) times daily with meals.   Yes Historical Provider, MD  bisacodyl (DULCOLAX) 5 MG EC tablet Take 5 mg by mouth daily as needed. CONSTIPATION   Yes Historical Provider, MD  budesonide-formoterol (SYMBICORT) 160-4.5 MCG/ACT inhaler Inhale 2 puffs into the lungs 2 (two) times daily.   Yes Historical Provider, MD  cyclophosphamide (CYTOXAN) 50 MG tablet Take 50 mg by mouth daily. Give on an empty stomach once daily at 1600. Take  1 hour before or 2 hours after meals. 10/24/13  Yes Tanda Rockers, MD  dextromethorphan (DELSYM) 30 MG/5ML liquid Take 30 mg by mouth 2 (two) times daily as needed for cough.   Yes Historical Provider, MD  dextromethorphan-guaiFENesin (MUCINEX DM) 30-600 MG per 12 hr tablet Take 1 tablet by mouth 2 (two) times daily.    Yes Historical Provider, MD  dutasteride (AVODART) 0.5 MG capsule Take 0.5 mg by mouth every other day. In the evening.   Yes Historical Provider, MD  esomeprazole (NEXIUM) 20 MG capsule Take 20 mg by mouth 2 (two) times daily before a meal.     Yes Historical Provider, MD  levalbuterol (XOPENEX HFA) 45 MCG/ACT inhaler Inhale 1-2 puffs into the lungs every 4 (four) hours as needed for wheezing. 04/26/13  Yes Tanda Rockers, MD  loratadine (CLARITIN) 10 MG tablet Take 10 mg by mouth daily.    Yes Historical Provider, MD  methylcellulose (CITRUCEL) oral powder Take 1 packet by mouth daily.   Yes Historical Provider, MD  predniSONE (DELTASONE) 10 MG tablet Take 10 mg by mouth daily with breakfast.   Yes Historical Provider,  MD  Simethicone (GAS-X PO) Take 2 tablets by mouth 2 (two) times daily.    Yes Historical Provider, MD  sulfamethoxazole-trimethoprim (BACTRIM DS) 800-160 MG per tablet Take 1 tablet by mouth daily. Take with Cytoxan.   Yes Historical Provider, MD  tamsulosin (FLOMAX) 0.4 MG CAPS capsule Take 1 capsule (0.4 mg total) by mouth every evening. 02/18/14  Yes [name], MD  traMADol (ULTRAM) 50 MG tablet 1-2 every 4 hours as needed for cough or pain 09/12/13  Yes Tanda Rockers, MD   BP 126/65  Pulse 80  Temp(Src) 97.3 F (36.3 C) (Oral)  Resp 20  Ht 5\' 7"  (1.702 m)  Wt 118 lb 11.9 oz (53.863 kg)  BMI 18.59 kg/m2  SpO2 100% Physical Exam  Constitutional: He is oriented to person, place, and time. He appears well-developed and well-nourished. No distress.  Thin adult male. No acute distress at rest.  HENT:  Head: Normocephalic.  Eyes: Conjunctivae are normal. Pupils are equal, round, and reactive to light. No scleral icterus.  Neck: Normal range of motion. Neck supple. No thyromegaly present.  Cardiovascular: Normal rate and regular rhythm.  Exam reveals no gallop and no friction rub.   No murmur heard. Resting tachycardia at 110.  Pulmonary/Chest: Effort normal and breath sounds normal. No respiratory distress. He has no wheezes. He has no rales.  Globally diminished breath sounds. No wheezing or prolongation noted. No focal diminished breath sounds  Abdominal: Soft. Bowel sounds are normal. He exhibits no  distension. There is no tenderness. There is no rebound.  Musculoskeletal: Normal range of motion.  Neurological: He is alert and oriented to person, place, and time.  Skin: Skin is warm and dry. No rash noted.  No dependent edema  Psychiatric: He has a normal mood and affect. His behavior is normal.    ED Course  Procedures (including critical care time) Labs Review Labs Reviewed  CBC WITH DIFFERENTIAL - Abnormal; Notable for the following:    RBC 2.73 (*)    Hemoglobin 9.0 (*)    HCT 27.4 (*)    MCV 100.4 (*)    RDW 15.8 (*)    Neutrophils Relative % 91 (*)    Lymphocytes Relative 4 (*)    Neutro Abs 7.9 (*)    Lymphs Abs 0.4 (*)    All other components within normal limits  BASIC METABOLIC PANEL - Abnormal; Notable for the following:    Glucose, Bld 113 (*)    BUN 26 (*)    Creatinine, Ser 1.85 (*)    GFR calc non Af Amer 34 (*)    GFR calc Af Amer 39 (*)    All other components within normal limits  COMPREHENSIVE METABOLIC PANEL - Abnormal; Notable for the following:    BUN 25 (*)    Creatinine, Ser 1.81 (*)    Calcium 8.0 (*)    Total Protein 5.3 (*)    Albumin 2.5 (*)    Total Bilirubin <0.2 (*)    GFR calc non Af Amer 35 (*)    GFR calc Af Amer 40 (*)    All other components within normal limits  HEPARIN LEVEL (UNFRACTIONATED) - Abnormal; Notable for the following:    Heparin Unfractionated 0.25 (*)    All other components within normal limits  CBC - Abnormal; Notable for the following:    RBC 2.58 (*)    Hemoglobin 8.7 (*)    HCT 26.4 (*)    MCV 102.3 (*)  RDW 15.7 (*)    All other components within normal limits  IRON AND TIBC - Abnormal; Notable for the following:    Iron 12 (*)    TIBC 149 (*)    Saturation Ratios 8 (*)    All other components within normal limits  FERRITIN - Abnormal; Notable for the following:    Ferritin 359 (*)    All other components within normal limits  RETICULOCYTES - Abnormal; Notable for the following:    RBC. 2.30 (*)     All other components within normal limits  CBC - Abnormal; Notable for the following:    RBC 2.38 (*)    Hemoglobin 7.9 (*)    HCT 24.0 (*)    MCV 100.8 (*)    RDW 15.9 (*)    All other components within normal limits  HEPARIN LEVEL (UNFRACTIONATED) - Abnormal; Notable for the following:    Heparin Unfractionated 0.16 (*)    All other components within normal limits  HEPARIN LEVEL (UNFRACTIONATED) - Abnormal; Notable for the following:    Heparin Unfractionated 0.25 (*)    All other components within normal limits  CBC - Abnormal; Notable for the following:    RBC 2.29 (*)    Hemoglobin 7.6 (*)    HCT 23.6 (*)    MCV 103.1 (*)    RDW 15.6 (*)    All other components within normal limits  HEPARIN LEVEL (UNFRACTIONATED) - Abnormal; Notable for the following:    Heparin Unfractionated 0.79 (*)    All other components within normal limits  CBC - Abnormal; Notable for the following:    RBC 3.03 (*)    Hemoglobin 9.7 (*)    HCT 29.4 (*)    RDW 19.8 (*)    All other components within normal limits  BASIC METABOLIC PANEL - Abnormal; Notable for the following:    Glucose, Bld 101 (*)    Creatinine, Ser 1.49 (*)    Calcium 7.9 (*)    GFR calc non Af Amer 44 (*)    GFR calc Af Amer 51 (*)    All other components within normal limits  CBC - Abnormal; Notable for the following:    RBC 2.79 (*)    Hemoglobin 8.7 (*)    HCT 26.5 (*)    RDW 19.4 (*)    All other components within normal limits  BASIC METABOLIC PANEL - Abnormal; Notable for the following:    Potassium 3.6 (*)    Glucose, Bld 127 (*)    Creatinine, Ser 1.38 (*)    Calcium 7.7 (*)    GFR calc non Af Amer 48 (*)    GFR calc Af Amer 56 (*)    All other components within normal limits  CBC - Abnormal; Notable for the following:    RBC 2.36 (*)    Hemoglobin 7.4 (*)    HCT 22.5 (*)    RDW 18.9 (*)    All other components within normal limits  CBC - Abnormal; Notable for the following:    RBC 2.86 (*)     Hemoglobin 8.9 (*)    HCT 26.4 (*)    RDW 17.7 (*)    All other components within normal limits  CBC - Abnormal; Notable for the following:    RBC 2.88 (*)    Hemoglobin 8.9 (*)    HCT 26.5 (*)    RDW 18.5 (*)    All other components within normal limits  HEPARIN LEVEL (  UNFRACTIONATED) - Abnormal; Notable for the following:    Heparin Unfractionated <0.10 (*)    All other components within normal limits  APTT - Abnormal; Notable for the following:    aPTT 52 (*)    All other components within normal limits  CBC - Abnormal; Notable for the following:    RBC 2.73 (*)    Hemoglobin 8.7 (*)    HCT 25.7 (*)    RDW 17.9 (*)    All other components within normal limits  BASIC METABOLIC PANEL - Abnormal; Notable for the following:    Creatinine, Ser 1.46 (*)    Calcium 7.7 (*)    GFR calc non Af Amer 45 (*)    GFR calc Af Amer 52 (*)    All other components within normal limits  CBC - Abnormal; Notable for the following:    RBC 2.75 (*)    Hemoglobin 8.6 (*)    HCT 25.6 (*)    RDW 17.8 (*)    All other components within normal limits  BASIC METABOLIC PANEL - Abnormal; Notable for the following:    Creatinine, Ser 1.42 (*)    Calcium 8.0 (*)    GFR calc non Af Amer 47 (*)    GFR calc Af Amer 54 (*)    All other components within normal limits  PRO B NATRIURETIC PEPTIDE - Abnormal; Notable for the following:    Pro B Natriuretic peptide (BNP) 4937.0 (*)    All other components within normal limits  D-DIMER, QUANTITATIVE - Abnormal; Notable for the following:    D-Dimer, Quant 2.61 (*)    All other components within normal limits  CBC - Abnormal; Notable for the following:    RBC 2.48 (*)    Hemoglobin 7.9 (*)    HCT 23.3 (*)    RDW 17.2 (*)    All other components within normal limits  CBC - Abnormal; Notable for the following:    RBC 2.52 (*)    Hemoglobin 7.9 (*)    HCT 23.7 (*)    RDW 17.2 (*)    All other components within normal limits  CBC - Abnormal; Notable for  the following:    RBC 2.47 (*)    Hemoglobin 7.6 (*)    HCT 23.1 (*)    RDW 16.9 (*)    All other components within normal limits  BASIC METABOLIC PANEL - Abnormal; Notable for the following:    BUN 24 (*)    Creatinine, Ser 1.56 (*)    Calcium 8.2 (*)    GFR calc non Af Amer 42 (*)    GFR calc Af Amer 48 (*)    All other components within normal limits  MRSA PCR SCREENING  APTT  PROTIME-INR  TROPONIN I  TROPONIN I  TROPONIN I  MAGNESIUM  HEMOGLOBIN A1C  LIPID PANEL  VITAMIN B12  FOLATE  HEPARIN LEVEL (UNFRACTIONATED)  HEPARIN LEVEL (UNFRACTIONATED)  HEPARIN LEVEL (UNFRACTIONATED)  HEPARIN LEVEL (UNFRACTIONATED)  I-STAT TROPOININ, ED  POCT ACTIVATED CLOTTING TIME  POCT ACTIVATED CLOTTING TIME  TYPE AND SCREEN  PREPARE RBC (CROSSMATCH)  PREPARE RBC (CROSSMATCH)  PREPARE RBC (CROSSMATCH)    Imaging Review Dg Chest 2 View  03/22/2014   CLINICAL DATA:  Short of breath  EXAM: CHEST  2 VIEW  COMPARISON:  None.  FINDINGS: Normal cardiac silhouette with ectatic aorta. There is chronic linear markings within the left and right lung. Multiple cavitary lesions are noted on comparison CT of 10/04/2013.  Lungs are hyperinflated. No pneumothorax.  IMPRESSION: No change in bilateral pulmonary opacities which represent cavitary lesions on comparison CT.  Hyperinflated lungs.   Electronically Signed   By: Suzy Bouchard M.D.   On: 03/22/2014 12:55   Ct Angio Chest Pe W/cm &/or Wo Cm  03/23/2014   CLINICAL DATA:  Elevated D-dimer.  EXAM: CT ANGIOGRAPHY CHEST WITH CONTRAST  TECHNIQUE: Multidetector CT imaging of the chest was performed using the standard protocol during bolus administration of intravenous contrast. Multiplanar CT image reconstructions and MIPs were obtained to evaluate the vascular anatomy.  CONTRAST:  100 mL of Omnipaque 350 intravenous contrast.  COMPARISON:  Current chest radiograph.  Chest CT dated 10/04/13  FINDINGS: Study somewhat limited by motion in the lung bases  limiting evaluation for PE in the smaller segmental and subsegmental vessels. Allowing for this, there is no evidence of pulmonary embolism.  Heart is normal in size. There are dense coronary artery calcifications. Great vessels are normal in caliber for age. No aortic dissection. No neck base, axillary, mediastinal or hilar masses or pathologically enlarged lymph nodes.  There is peribronchial interstitial thickening most evident in the lower lobes, right middle lobe and lingula of the left upper lobe. There are multiple areas of mucous plugging distending bronchi, most prominently in the left lower lobe. There are additional areas of patchy airspace opacity in the lungs, most prominently in the posterior medial right lower lobe and lateral left upper lobe. Additional areas of peripheral reticular opacity are likely due to subsegmental atelectasis. Minimal right pleural effusion. No pneumothorax.  Limited evaluation of the upper abdomen shows a moderate hiatal hernia but is otherwise unremarkable. No significant bony abnormality.  Review of the MIP images confirms the above findings.  IMPRESSION: 1. No evidence of a pulmonary embolus. 2. There are lung abnormalities with peribronchial interstitial thickening most evident in the lower lobes, right middle lobe and inferior left upper lobe, areas of mucous plugging in bronchi, and other smaller areas of ill-defined focal airspace opacity. Acute bronchitis with areas of multifocal pneumonia is suspected. The bronchial mucous plugging and interstitial thickening may be chronic with the small focal areas of lung opacity reflecting a more chronic process such as organizing pneumonia.   Electronically Signed   By: Lajean Manes M.D.   On: 03/23/2014 13:05     EKG Interpretation   Date/Time:  Thursday March 13 2014 11:45:13 EDT Ventricular Rate:  106 PR Interval:  158 QRS Duration: 82 QT Interval:  371 QTC Calculation: 493 R Axis:   21 Text Interpretation:   Sinus tachycardia Slow R progression. No ST or T  wave changes. Confirmed by Jeneen Rinks  MD, [place] ([id number]) on 03/13/2014 12:10:48 PM  Also confirmed by Jeneen Rinks  MD, [place] ([id number])  on 03/13/2014 1:29:33 PM      MDM   Final diagnoses:  Dyspnea    Pt with progressive dyspnea, worsening fatigue and weakness.  Chronic lung disease without sx/stimata of acute exacerbation.  Has abnormal Myoview, but without classic angina.  D/w Dr. [name]:  No acute pulmonary symptoms noted today.  D/W Cardiology:  Pt to have consult in ED.  13:37:  I was contacted by Dr. Percival Spanish through [facility]. Telemetry bed and transfer To [facility] requested.  D/W patient.  Remains asymptomatic at rest.  Given Aspirin,.    Tanna Furry, MD 03/13/14 [place], MD 03/24/14 [phone]

## 2014-03-13 NOTE — ED Notes (Signed)
Jana Half triage RN walked pt to room. Pt refused to sit in wheelchair and insisted to walk. Upon entering room, pt transported to xray. Will assess pt with pt return.

## 2014-03-13 NOTE — ED Notes (Signed)
Post ambulation to pt room.  Pt sats 95% with elevated hr of 132.  Pt refused wheelchair transport.

## 2014-03-13 NOTE — ED Notes (Signed)
Pt given urinal per request. Pt denies pain since arrival. Pt comfortable in bed at present time.

## 2014-03-13 NOTE — ED Notes (Signed)
Pt has had shortness of breath for 3-4 months.  Pt states has gotten worse today.  Slight cough with yellow sputum.  Pt has hx of bronchitis and lung fungus.  Pt does not wear oxygen at home.  Unknown for fever at home.

## 2014-03-13 NOTE — H&P (Signed)
The patient was seen and examined, and I agree with the assessment and plan as documented above, with modifications as noted below. Very pleasant 78 yr old male with a PMH significant for Wegener's granulomatosis and follows with Dr. [name] for his pulmonary disease (severe multifocal bronchiectasis as well as numerous cavitary and non-cavitary nodules). Has been having chest tightness with significant exertion for the last few months with increasing dyspnea on exertion. Also had 2 brothers who both had CABG. Referred to Dr. Debara Pickett who proceeded with nuclear stress testing which demonstrated a "moderate region of apical and inferior scar with mild superimposed qualitative ischemia". LV wall motion demonstrated a "moderate degree of hypokinesis involving the mid-distal inferior wall and apex with an EF of 40%".  Prior echo dated 03/30/2010 showed EF 45-50%.  Presented to [facility] ED with increasing fatigue and chest tightness which developed this morning, which resolved within 10 minutes. He was given ASA, heparin, and nitro paste. ECG demonstrated sinus tachycardia with no acute ST-T abnormalities. POC troponin normal, serum troponins pending. Also noted to be anemic, Hgb 9 (10 on 12/06/13). He currently denies chest pain and is sitting comfortably upright in the bed with several family members present.  RECS: Given the progression of symptoms, family history, and abnormalities noted on nuclear MPI study, I recommend that the patient undergo coronary angiography. He has depressed renal function (GFR currently 34 ml/min, had been 49 ml/min on 12/06/13). Given his reduced EF, will cautiously administer IV fluids for renal protection prior to undergoing cardiac cath to attenuate the propensity for contrast-induced nephropathy.  This was explained to the patient and family, including the risk of needing hemodialysis. Continue ASA, heparin, and statin and cycle serum troponins.  In order to avoid additional  contrast exposure with LV angiography, will obtain echocardiogram to assess LV function and regional wall motion.

## 2014-03-13 NOTE — H&P (Signed)
History and Physical   Patient ID: Richard [name] MRN: [id number], DOB/AGE: [birth year] 78 y.o. Date of Encounter: 03/13/2014  Primary Physician: [name], MD Primary Cardiologist: Dr. Debara Pickett  Chief Complaint:  Chest pain, abnl stress test  HPI: Richard [name] is a 78 y.o. male with no diagnosed history of CAD. He had some chest discomfort, described as a pressure, in his chest and possibly abdomen.   Dr. Fuller Plan ordered a CT scan of the abdomen. This sparked a procedure by Dr. Risa Grill for prostate problems. He was also noted to have extensive atherosclerosis, and was seen by Dr. Debara Pickett, who ordered a stress test. The stress test was on 08/05 and was abnormal.   Today, he got up and was feeling very fatigued. He was also having chest pressure, at a 4-5/10. The worst episode in the past had been 9/10. He was able to relax and breathe deeply and the episode resolved in < 10 minutes.   He went to the [facility] ER and was treated there with ASA, heparin and nitroglycerin paste.    Past Medical History  Diagnosis Date  . Allergy   . GERD (gastroesophageal reflux disease)   . Arthritis   . Asthma   . Fungal infection     lungs  . Cataract   . Ulcer   . BPH (benign prostatic hyperplasia)     sees Dr. Risa Grill, biopsy June 2015 was benign   . Wegener's granulomatosis 2011    sees Dr. [name]   . Peptic stricture of esophagus   . Anemia   . Hiatal hernia   . Adenomatous colon polyp 08/2011  . Bronchiectasis   . On home oxygen therapy 12-18-12    uses 2 l/m nasally at bedtime  . HOH (hard of hearing) 12-18-12    bilaterally  . HBP (high blood pressure) 09/28/2013    Surgical History:  Past Surgical History  Procedure Laterality Date  . Hernia repair    . Eye surgery      cataracts removed.   . Colonoscopy  08-16-11    per Dr. Fuller Plan, diverticulosis and polyps, repeat in 5 yrs   . Esophagogastroduodenoscopy (egd) with esophageal dilation  11-29-10    per Dr. Fuller Plan   . Cataract  extraction, bilateral  12-18-12    bilateral  . Cholecystectomy N/A 12/21/2012    Procedure: LAPAROSCOPIC CHOLECYSTECTOMY WITH INTRAOPERATIVE CHOLANGIOGRAM;  Surgeon: Edward Jolly, MD;  Location: [facility] ORS;  Service: General;  Laterality: N/A;     I have reviewed the patient's current medications. Prior to Admission medications   Medication Sig Start Date End Date Taking? Authorizing Provider  acetaminophen (TYLENOL) 325 MG tablet Take 650 mg by mouth every 6 (six) hours as needed for mild pain.   Yes Historical Provider, MD  aspirin 81 MG tablet Take 81 mg by mouth every evening.    Yes Historical Provider, MD  b complex vitamins tablet Take 1 tablet by mouth daily.     Yes Historical Provider, MD  BIOTIN PO Take 1 tablet by mouth 3 (three) times daily with meals.   Yes Historical Provider, MD  bisacodyl (DULCOLAX) 5 MG EC tablet Take 5 mg by mouth daily as needed. CONSTIPATION   Yes Historical Provider, MD  budesonide-formoterol (SYMBICORT) 160-4.5 MCG/ACT inhaler Inhale 2 puffs into the lungs 2 (two) times daily.   Yes Historical Provider, MD  cyclophosphamide (CYTOXAN) 50 MG tablet Take 50 mg by mouth daily. Give on an empty  stomach once daily at 1600. Take  1 hour before or 2 hours after meals. 10/24/13  Yes Tanda Rockers, MD  dextromethorphan (DELSYM) 30 MG/5ML liquid Take 30 mg by mouth 2 (two) times daily as needed for cough.   Yes Historical Provider, MD  dextromethorphan-guaiFENesin (MUCINEX DM) 30-600 MG per 12 hr tablet Take 1 tablet by mouth 2 (two) times daily.    Yes Historical Provider, MD  dutasteride (AVODART) 0.5 MG capsule Take 0.5 mg by mouth every other day. In the evening.   Yes Historical Provider, MD  esomeprazole (NEXIUM) 20 MG capsule Take 20 mg by mouth 2 (two) times daily before a meal.    Yes Historical Provider, MD  levalbuterol (XOPENEX HFA) 45 MCG/ACT inhaler Inhale 1-2 puffs into the lungs every 4 (four) hours as needed for wheezing. 04/26/13  Yes Tanda Rockers, MD  loratadine (CLARITIN) 10 MG tablet Take 10 mg by mouth daily.    Yes Historical Provider, MD  methylcellulose (CITRUCEL) oral powder Take 1 packet by mouth daily.   Yes Historical Provider, MD  predniSONE (DELTASONE) 10 MG tablet Take 10 mg by mouth daily with breakfast.   Yes Historical Provider, MD  Simethicone (GAS-X PO) Take 2 tablets by mouth 2 (two) times daily.    Yes Historical Provider, MD  sulfamethoxazole-trimethoprim (BACTRIM DS) 800-160 MG per tablet Take 1 tablet by mouth daily. Take with Cytoxan.   Yes Historical Provider, MD  tamsulosin (FLOMAX) 0.4 MG CAPS capsule Take 1 capsule (0.4 mg total) by mouth every evening. 02/18/14  Yes [name], MD  traMADol Veatrice Bourbon) 50 MG tablet 1-2 every 4 hours as needed for cough or pain 09/12/13  Yes Tanda Rockers, MD   Scheduled Meds: . [START ON 03/14/2014] aspirin  324 mg Oral Pre-Cath  . [START ON 03/15/2014] aspirin EC  81 mg Oral QPM  . [START ON 03/14/2014] B-complex with vitamin C  1 tablet Oral Daily  . budesonide-formoterol  2 puff Inhalation BID  . cyclophosphamide  50 mg Oral Daily  . dextromethorphan-guaiFENesin  1 tablet Oral BID  . [START ON 03/14/2014] dutasteride  0.5 mg Oral QODAY  . [START ON 03/14/2014] loratadine  10 mg Oral Daily  . nitroGLYCERIN  0.5 inch Topical 4 times per day  . pantoprazole  40 mg Oral Daily  . [START ON 03/14/2014] predniSONE  10 mg Oral Q breakfast  . [START ON 03/14/2014] psyllium  1 packet Oral Daily  . simethicone  160 mg Oral BID  . simvastatin  20 mg Oral q1800  . sodium chloride  3 mL Intravenous Q12H  . sodium chloride  3 mL Intravenous Q12H  . sulfamethoxazole-trimethoprim  1 tablet Oral Daily  . tamsulosin  0.4 mg Oral QPM   Continuous Infusions: . [START ON 03/14/2014] sodium chloride    . heparin 1,100 Units/hr (03/13/14 1358)   PRN Meds:.sodium chloride, sodium chloride, acetaminophen, ALPRAZolam, bisacodyl, dextromethorphan, levalbuterol, nitroGLYCERIN, ondansetron (ZOFRAN) IV,  sodium chloride, sodium chloride, traMADol, zolpidem  Allergies:  Allergies  Allergen Reactions  . Factive [Gemifloxacin]     Ineffective     History   Social History  . Marital Status: Married    Spouse Name: N/A    Number of Children: N/A  . Years of Education: N/A   Occupational History  . Retired    Social History Main Topics  . Smoking status: Never Smoker   . Smokeless tobacco: Never Used  . Alcohol Use: No  . Drug  Use: No  . Sexual Activity: Yes   Other Topics Concern  . Not on file   Social History Narrative  . No narrative on file    Family History  Problem Relation Age of Onset  . Asthma Father   . Coronary artery disease Brother   . Coronary artery disease Brother   . Coronary artery disease Mother    Family Status  Relation Status Death Age  . Mother Deceased   . Father Deceased     Review of Systems:   Full 14-point review of systems otherwise negative except as noted above.  Physical Exam: Blood pressure 120/74, pulse 91, temperature 97 F (36.1 C), temperature source Oral, resp. rate 19, height 5\' 7"  (1.702 m), weight 123 lb 14.4 oz (56.2 kg), SpO2 95.00%. General: Well developed, well nourished,male in no acute distress. Head: Normocephalic, atraumatic, sclera non-icteric, no xanthomas, nares are without discharge. Dentition: poor Neck: No carotid bruits. JVD not elevated. No thyromegally Lungs: Good expansion bilaterally. without wheezes or rhonchi. Decreased BS bases with some rales Heart: Regular rate and rhythm with S1 S2.  No S3 or S4.  No murmur, no rubs, or gallops appreciated. Abdomen: Soft, non-tender, non-distended with normoactive bowel sounds. No hepatomegaly. No rebound/guarding. No obvious abdominal masses. Msk:  Strength and tone appear normal for age. No joint deformities or effusions, no spine or costo-vertebral angle tenderness. Extremities: No clubbing or cyanosis. No edema. Distal pedal pulses are 2+ in 4 extrem Neuro:  Alert and oriented X 3. Moves all extremities spontaneously. No focal deficits noted. Psych:  Responds to questions appropriately with a normal affect. Skin: No rashes or lesions noted  Labs:   Lab Results  Component Value Date   WBC 8.8 03/13/2014   HGB 9.0* 03/13/2014   HCT 27.4* 03/13/2014   MCV 100.4* 03/13/2014   PLT 258 03/13/2014    Recent Labs  03/13/14 1351  INR 1.15     Recent Labs Lab 03/13/14 1159  NA 137  K 4.3  CL 101  CO2 23  BUN 26*  CREATININE 1.85*  CALCIUM 8.5  GLUCOSE 113*    Recent Labs  03/13/14 1209  TROPIPOC 0.01    Radiology/Studies: Dg Chest 2 View 03/13/2014   CLINICAL DATA:  Shortness of breath, cough, chest congestion, and chest pain. Wegener's granulomatosis.  EXAM: CHEST  2 VIEW  COMPARISON:  02/12/2014, 12/06/2013, 11/07/2013, 10/07/2013, and CT scan dated 10/04/2013  FINDINGS: The lungs are hyperinflated consistent with emphysema. There are multiple patchy areas of ill-defined infiltrate/ scarring as well as cavitary lesions in both lungs. The appearance is essentially unchanged. Heart size and pulmonary vascularity are normal. Large hiatal hernia, stable. No acute osseous abnormality.  IMPRESSION: Severe chronic lung disease with emphysema and multiple areas of scarring, cavitation, and patchy infiltrate consistent with Wegener's granulomatosis.  No significant change since 02/12/2014.   Electronically Signed   By: Rozetta Nunnery M.D.   On: 03/13/2014 11:38  Dg Chest 2 View  03/13/2014   CLINICAL DATA:  Shortness of breath, cough, chest congestion, and chest pain. Wegener's granulomatosis.  EXAM: CHEST  2 VIEW  COMPARISON:  02/12/2014, 12/06/2013, 11/07/2013, 10/07/2013, and CT scan dated 10/04/2013  FINDINGS: The lungs are hyperinflated consistent with emphysema. There are multiple patchy areas of ill-defined infiltrate/ scarring as well as cavitary lesions in both lungs. The appearance is essentially unchanged. Heart size and pulmonary vascularity are  normal. Large hiatal hernia, stable. No acute osseous abnormality.  IMPRESSION: Severe chronic lung disease  with emphysema and multiple areas of scarring, cavitation, and patchy infiltrate consistent with Wegener's granulomatosis.  No significant change since 02/12/2014.   Electronically Signed   By: Rozetta Nunnery M.D.   On: 03/13/2014 11:38   ECG: 03/13/2014 SR, no ST elevation Vent. rate 106 BPM PR interval 158 ms QRS duration 82 ms QT/QTc 371/493 ms P-R-T axes 69 21 77  ASSESSMENT AND PLAN:  Principal Problem:   Intermediate coronary syndrome - admit, cycle enzymes, use heparin and nitrates to manage symptoms. Cath in am, to further define his anatomy. The risks and benefits of a cardiac catheterization including, but not limited to, death, stroke, MI, kidney damage and bleeding were discussed with the patient who indicates understanding and agrees to proceed.   Active Problems:   HYPOTHYROIDISM - ck TSH    HYPERLIPIDEMIA - ck lipids    ANEMIA - follow, ck iron studies    Wegener's granulomatosis - may need CCM consult if resp status worsens or CABG is needed.  Otherwise, continue home medications.   Jonetta Speak, PA-C 03/13/2014 7:32 PM Beeper [phone]

## 2014-03-13 NOTE — ED Notes (Signed)
MD at bedside. 

## 2014-03-14 ENCOUNTER — Encounter (HOSPITAL_COMMUNITY)
Admission: EM | Disposition: A | Discharge status: Home / Self Care | Payer: Medicare Other | Source: Home / Self Care | Attending: Internal Medicine

## 2014-03-14 ENCOUNTER — Inpatient Hospital Stay (HOSPITAL_COMMUNITY): Payer: Medicare Other

## 2014-03-14 ENCOUNTER — Other Ambulatory Visit: Payer: Self-pay

## 2014-03-14 DIAGNOSIS — Z01810 Encounter for preprocedural cardiovascular examination: Secondary | ICD-10-CM

## 2014-03-14 DIAGNOSIS — I2510 Atherosclerotic heart disease of native coronary artery without angina pectoris: Secondary | ICD-10-CM

## 2014-03-14 HISTORY — PX: LEFT HEART CATHETERIZATION WITH CORONARY ANGIOGRAM: SHX5451

## 2014-03-14 LAB — PULMONARY FUNCTION TEST
DL/VA % pred: 130 %
DL/VA: 5.74 ml/min/mmHg/L
DLCO cor % pred: 58 %
DLCO cor: 16.63 ml/min/mmHg
DLCO unc % pred: 45 %
DLCO unc: 13 ml/min/mmHg
FEF 25-75 Post: 0.54 L/sec
FEF 25-75 Pre: 0.43 L/sec
FEF2575-%Change-Post: 26 %
FEF2575-%Pred-Post: 28 %
FEF2575-%Pred-Pre: 22 %
FEV1-%Change-Post: 6 %
FEV1-%Pred-Post: 47 %
FEV1-%Pred-Pre: 44 %
FEV1-Post: 1.26 L
FEV1-Pre: 1.18 L
FEV1FVC-%Change-Post: -1 %
FEV1FVC-%Pred-Pre: 67 %
FEV6-%Change-Post: 9 %
FEV6-%Pred-Post: 71 %
FEV6-%Pred-Pre: 65 %
FEV6-Post: 2.48 L
FEV6-Pre: 2.27 L
FEV6FVC-%Change-Post: 0 %
FEV6FVC-%Pred-Post: 101 %
FEV6FVC-%Pred-Pre: 101 %
FVC-%Change-Post: 8 %
FVC-%Pred-Post: 70 %
FVC-%Pred-Pre: 65 %
FVC-Post: 2.63 L
FVC-Pre: 2.41 L
Post FEV1/FVC ratio: 48 %
Post FEV6/FVC ratio: 94 %
Pre FEV1/FVC ratio: 49 %
Pre FEV6/FVC Ratio: 94 %

## 2014-03-14 LAB — COMPREHENSIVE METABOLIC PANEL
ALBUMIN: 2.5 g/dL — AB (ref 3.5–5.2)
ALK PHOS: 78 U/L (ref 39–117)
ALT: 7 U/L (ref 0–53)
ANION GAP: 13 (ref 5–15)
AST: 11 U/L (ref 0–37)
BUN: 25 mg/dL — AB (ref 6–23)
CO2: 23 mEq/L (ref 19–32)
Calcium: 8 mg/dL — ABNORMAL LOW (ref 8.4–10.5)
Chloride: 107 mEq/L (ref 96–112)
Creatinine, Ser: 1.81 mg/dL — ABNORMAL HIGH (ref 0.50–1.35)
GFR calc Af Amer: 40 mL/min — ABNORMAL LOW (ref >90)
GFR calc non Af Amer: 35 mL/min — ABNORMAL LOW (ref >90)
Glucose, Bld: 91 mg/dL (ref 70–99)
Potassium: 4.5 mEq/L (ref 3.7–5.3)
Sodium: 143 mEq/L (ref 137–147)
TOTAL PROTEIN: 5.3 g/dL — AB (ref 6.0–8.3)
Total Bilirubin: <0.2 mg/dL — ABNORMAL LOW (ref 0.3–1.2)

## 2014-03-14 LAB — CBC
HCT: 26.4 % — ABNORMAL LOW (ref 39.0–52.0)
Hemoglobin: 8.7 g/dL — ABNORMAL LOW (ref 13.0–17.0)
MCH: 33.7 pg (ref 26.0–34.0)
MCHC: 33 g/dL (ref 30.0–36.0)
MCV: 102.3 fL — AB (ref 78.0–100.0)
PLATELETS: 252 10*3/uL (ref 150–400)
RBC: 2.58 MIL/uL — ABNORMAL LOW (ref 4.22–5.81)
RDW: 15.7 % — AB (ref 11.5–15.5)
WBC: 7.1 10*3/uL (ref 4.0–10.5)

## 2014-03-14 LAB — HEPARIN LEVEL (UNFRACTIONATED): HEPARIN UNFRACTIONATED: 0.25 [IU]/mL — AB (ref 0.30–0.70)

## 2014-03-14 LAB — LIPID PANEL
CHOL/HDL RATIO: 2.7 ratio
CHOLESTEROL: 112 mg/dL (ref 0–200)
HDL: 42 mg/dL (ref >39)
LDL Cholesterol: 58 mg/dL (ref 0–99)
Triglycerides: 61 mg/dL (ref <150)
VLDL: 12 mg/dL (ref 0–40)

## 2014-03-14 LAB — TROPONIN I: Troponin I: <0.3 ng/mL (ref <0.30)

## 2014-03-14 SURGERY — LEFT HEART CATHETERIZATION WITH CORONARY ANGIOGRAM
Anesthesia: LOCAL

## 2014-03-14 MED ORDER — SODIUM CHLORIDE 0.9 % IJ SOLN: 3.0000 mL | INTRAMUSCULAR | Status: DC | PRN

## 2014-03-14 MED ORDER — FENTANYL CITRATE 0.05 MG/ML IJ SOLN
INTRAMUSCULAR | Status: AC
  Filled 2014-03-14: qty 2

## 2014-03-14 MED ORDER — MIDAZOLAM HCL 2 MG/2ML IJ SOLN
INTRAMUSCULAR | Status: AC
  Filled 2014-03-14: qty 2

## 2014-03-14 MED ORDER — NITROGLYCERIN 1 MG/10 ML FOR IR/CATH LAB
INTRA_ARTERIAL | Status: AC
  Filled 2014-03-14: qty 10

## 2014-03-14 MED ORDER — TRAMADOL HCL 50 MG PO TABS: 50.0000 mg | ORAL_TABLET | Freq: Four times a day (QID) | ORAL | Status: DC | PRN

## 2014-03-14 MED ORDER — NITROGLYCERIN 2 % TD OINT
1.0000 [in_us] | TOPICAL_OINTMENT | Freq: Four times a day (QID) | TRANSDERMAL | Status: DC
  Administered 2014-03-15 – 2014-03-19 (×17): 1 [in_us] via TOPICAL
  Filled 2014-03-14 (×3): qty 30

## 2014-03-14 MED ORDER — HEPARIN (PORCINE) IN NACL 2-0.9 UNIT/ML-% IJ SOLN
INTRAMUSCULAR | Status: AC
  Filled 2014-03-14: qty 1000

## 2014-03-14 MED ORDER — BISOPROLOL FUMARATE 5 MG PO TABS
2.5000 mg | ORAL_TABLET | Freq: Every day | ORAL | Status: DC
  Administered 2014-03-14 – 2014-03-15 (×2): 2.5 mg via ORAL
  Filled 2014-03-14 (×3): qty 0.5

## 2014-03-14 MED ORDER — LIDOCAINE HCL (PF) 1 % IJ SOLN
INTRAMUSCULAR | Status: AC
Start: 2014-03-14 — End: 2014-03-14
  Filled 2014-03-14: qty 30

## 2014-03-14 MED ORDER — SODIUM CHLORIDE 0.9 % IV SOLN
1.0000 mL/kg/h | INTRAVENOUS | Status: AC
  Administered 2014-03-14: 1 mL/kg/h via INTRAVENOUS

## 2014-03-14 MED ORDER — SODIUM CHLORIDE 0.9 % IJ SOLN
3.0000 mL | Freq: Two times a day (BID) | INTRAMUSCULAR | Status: DC
  Administered 2014-03-16 – 2014-03-17 (×3): 3 mL via INTRAVENOUS

## 2014-03-14 MED ORDER — HEPARIN (PORCINE) IN NACL 100-0.45 UNIT/ML-% IJ SOLN
1100.0000 [IU]/h | INTRAMUSCULAR | Status: DC
  Administered 2014-03-15: 1100 [IU]/h via INTRAVENOUS
  Filled 2014-03-14 (×3): qty 250

## 2014-03-14 MED ORDER — SODIUM CHLORIDE 0.9 % IV SOLN: 250.0000 mL | INTRAVENOUS | Status: DC | PRN

## 2014-03-14 MED ORDER — VERAPAMIL HCL 2.5 MG/ML IV SOLN
INTRAVENOUS | Status: AC
  Filled 2014-03-14: qty 2

## 2014-03-14 NOTE — H&P (View-Only) (Signed)
Subjective: No CP.  SOB as usual  Objective: Vital signs in last 24 hours: Temp:  [97 F (36.1 C)-99.5 F (37.5 C)] 98.9 F (37.2 C) (08/07 0432) Pulse Rate:  [91-118] 98 (08/07 0432) Resp:  [16-21] 18 (08/07 0432) BP: (100-129)/(57-76) 129/73 mmHg (08/07 0432) SpO2:  [94 %-99 %] 96 % (08/07 0709) Weight:  [117 lb 12.8 oz (53.434 kg)-124 lb (56.246 kg)] 117 lb 12.8 oz (53.434 kg) (08/07 0432) Last BM Date: 03/13/14  Intake/Output from previous day: 08/06 0701 - 08/07 0700 In: 782.5 [P.O.:240; I.V.:542.5] Out: 625 [Urine:625] Intake/Output this shift:    Medications Current Facility-Administered Medications  Medication Dose Route Frequency Provider Last Rate Last Dose  . 0.9 %  sodium chloride infusion  250 mL Intravenous PRN Rhonda G Barrett, PA-C      . 0.9 %  sodium chloride infusion   Intravenous Continuous Evelene Croon Barrett, PA-C 40 mL/hr at 03/13/14 2047    . acetaminophen (TYLENOL) tablet 650 mg  650 mg Oral Q4H PRN Rhonda G Barrett, PA-C      . ALPRAZolam (XANAX) tablet 0.25 mg  0.25 mg Oral BID PRN Lonn Georgia, PA-C      . [START ON 03/15/2014] aspirin EC tablet 81 mg  81 mg Oral QPM Minus Breeding, MD      . B-complex with vitamin C tablet 1 tablet  1 tablet Oral Daily Rhonda G Barrett, PA-C      . bisacodyl (DULCOLAX) EC tablet 5 mg  5 mg Oral Daily PRN Evelene Croon Barrett, PA-C      . budesonide-formoterol (SYMBICORT) 160-4.5 MCG/ACT inhaler 2 puff  2 puff Inhalation BID Evelene Croon Barrett, PA-C   2 puff at 03/14/14 0708  . Cyclophosphamide CAPS 50 mg  50 mg Oral Q1400 Minus Breeding, MD   50 mg at 03/13/14 2022  . dextromethorphan (DELSYM) 30 MG/5ML liquid 30 mg  30 mg Oral BID PRN Rhonda G Barrett, PA-C      . dextromethorphan-guaiFENesin (MUCINEX DM) 30-600 MG per 12 hr tablet 1 tablet  1 tablet Oral BID Evelene Croon Barrett, PA-C   1 tablet at 03/13/14 2150  . dutasteride (AVODART) capsule 0.5 mg  0.5 mg Oral QODAY Rhonda G Barrett, PA-C      . heparin ADULT  infusion 100 units/mL (25000 units/250 mL)  1,250 Units/hr Intravenous Continuous Narda Bonds, RPH 12.5 mL/hr at 03/14/14 0226 1,250 Units/hr at 03/14/14 0226  . levalbuterol (XOPENEX) nebulizer solution 0.63 mg  0.63 mg Inhalation Q4H PRN Evelene Croon Barrett, PA-C      . loratadine (CLARITIN) tablet 10 mg  10 mg Oral Daily Rhonda G Barrett, PA-C      . nitroGLYCERIN (NITROGLYN) 2 % ointment 0.5 inch  0.5 inch Topical 4 times per day Lonn Georgia, PA-C   0.5 inch at 03/14/14 0501  . nitroGLYCERIN (NITROSTAT) SL tablet 0.4 mg  0.4 mg Sublingual Q5 Min x 3 PRN Rhonda G Barrett, PA-C      . ondansetron (ZOFRAN) injection 4 mg  4 mg Intravenous Q6H PRN Rhonda G Barrett, PA-C      . pantoprazole (PROTONIX) EC tablet 40 mg  40 mg Oral Daily Rhonda G Barrett, PA-C   40 mg at 03/13/14 1606  . predniSONE (DELTASONE) tablet 10 mg  10 mg Oral Q breakfast Rhonda G Barrett, PA-C      . psyllium (HYDROCIL/METAMUCIL) packet 1 packet  1 packet Oral Daily Polly Cobia, RPH      .  simethicone (MYLICON) chewable tablet 160 mg  160 mg Oral BID Evelene Croon Barrett, PA-C   160 mg at 03/13/14 2058  . simvastatin (ZOCOR) tablet 20 mg  20 mg Oral q1800 Rhonda G Barrett, PA-C   20 mg at 03/13/14 2239  . sodium chloride 0.9 % injection 3 mL  3 mL Intravenous Q12H Rhonda G Barrett, PA-C   3 mL at 03/13/14 2102  . sodium chloride 0.9 % injection 3 mL  3 mL Intravenous PRN Rhonda G Barrett, PA-C      . sulfamethoxazole-trimethoprim (BACTRIM DS) 800-160 MG per tablet 1 tablet  1 tablet Oral Daily Evelene Croon Barrett, PA-C   1 tablet at 03/13/14 2150  . tamsulosin (FLOMAX) capsule 0.4 mg  0.4 mg Oral QPM Rhonda G Barrett, PA-C   0.4 mg at 03/13/14 2239  . traMADol (ULTRAM) tablet 50-100 mg  50-100 mg Oral Q6H PRN Rhonda G Barrett, PA-C      . zolpidem (AMBIEN) tablet 5 mg  5 mg Oral QHS PRN Lonn Georgia, PA-C        PE: General appearance: alert, cooperative and no distress Lungs: clear to auscultation bilaterally Heart:  reg rhythm.  Rate fast.  1/6 sys MM RSB Extremities: No LEE Pulses: 2+ and symmetric Skin: warm and dry Neurologic: Grossly normal  Lab Results:   Recent Labs  03/13/14 1159 03/14/14 0555  WBC 8.8 7.1  HGB 9.0* 8.7*  HCT 27.4* 26.4*  PLT 258 252   BMET  Recent Labs  03/13/14 1159 03/14/14 0555  NA 137 143  K 4.3 4.5  CL 101 107  CO2 23 23  GLUCOSE 113* 91  BUN 26* 25*  CREATININE 1.85* 1.81*  CALCIUM 8.5 8.0*   PT/INR  Recent Labs  03/13/14 1351  LABPROT 14.7  INR 1.15   Cholesterol  Recent Labs  03/14/14 0555  CHOL 112   Lipid Panel     Component Value Date/Time   CHOL 112 03/14/2014 0555   TRIG 61 03/14/2014 0555   HDL 42 03/14/2014 0555   CHOLHDL 2.7 03/14/2014 0555   VLDL 12 03/14/2014 0555   LDLCALC 58 03/14/2014 0555      Assessment/Plan    Principal Problem:   Unstable angina Active Problems:   HYPOTHYROIDISM   HYPERLIPIDEMIA   ANEMIA   Wegener's granulomatosis   Abnormal nuclear stress test: Intermediate risk with moderate region of apical and inferior scar with mild superimposed ischemia; EF 40%  Plan:  Ruled out for MI.  Lexiscan:  Moderate hypokinesis involving the mid-distal inferior wall and apex with an EF of 40%.  Moderate region of apical and inferior scar with  mild superimposed qualitative ischemia.  Cardiac cath this morning.  SCr 1.85.  Easy on the contrast.  Lipids look excellent.  BP controlled  Sinus Tach on the monitor.  115BPM.  It would be nice to use a BB but with lund disease it might make his breathing more difficult.  ?B1 selective.     Echo pending   LOS: 1 day    HAGER, BRYAN PA-C 03/14/2014 7:42 AM  Patient seen.  Agree with assessment and plan as noted above.  The patient is anemic with hemoglobin 8.7 today.  This may be contributing to his sinus tachycardia also.  We'll check an anemia panel and request stools for occult blood.  His anemia may be secondary to chronic disease of his Wegener's .Patient has  had no further chest discomfort.  Awaiting cardiac cath.  Agree with prior notes that if a stent is required, bare-metal stent may be preferable in view of his anemia.  He denied hemoptysis in the past to me upon questioning today, but prior notes indicate a past history of hemoptysis.

## 2014-03-14 NOTE — Progress Notes (Signed)
H.R. 120-140 with activity then back down to S.R. Patient is d.o.e.

## 2014-03-14 NOTE — Progress Notes (Addendum)
Subjective: No CP.  SOB as usual  Objective: Vital signs in last 24 hours: Temp:  [97 F (36.1 C)-99.5 F (37.5 C)] 98.9 F (37.2 C) (08/07 0432) Pulse Rate:  [91-118] 98 (08/07 0432) Resp:  [16-21] 18 (08/07 0432) BP: (100-129)/(57-76) 129/73 mmHg (08/07 0432) SpO2:  [94 %-99 %] 96 % (08/07 0709) Weight:  [117 lb 12.8 oz (53.434 kg)-124 lb (56.246 kg)] 117 lb 12.8 oz (53.434 kg) (08/07 0432) Last BM Date: 03/13/14  Intake/Output from previous day: 08/06 0701 - 08/07 0700 In: 782.5 [P.O.:240; I.V.:542.5] Out: 625 [Urine:625] Intake/Output this shift:    Medications Current Facility-Administered Medications  Medication Dose Route Frequency Provider Last Rate Last Dose  . 0.9 %  sodium chloride infusion  250 mL Intravenous PRN Rhonda G Barrett, PA-C      . 0.9 %  sodium chloride infusion   Intravenous Continuous Evelene Croon Barrett, PA-C 40 mL/hr at 03/13/14 2047    . acetaminophen (TYLENOL) tablet 650 mg  650 mg Oral Q4H PRN Rhonda G Barrett, PA-C      . ALPRAZolam (XANAX) tablet 0.25 mg  0.25 mg Oral BID PRN Lonn Georgia, PA-C      . [START ON 03/15/2014] aspirin EC tablet 81 mg  81 mg Oral QPM Minus Breeding, MD      . B-complex with vitamin C tablet 1 tablet  1 tablet Oral Daily Rhonda G Barrett, PA-C      . bisacodyl (DULCOLAX) EC tablet 5 mg  5 mg Oral Daily PRN Evelene Croon Barrett, PA-C      . budesonide-formoterol (SYMBICORT) 160-4.5 MCG/ACT inhaler 2 puff  2 puff Inhalation BID Evelene Croon Barrett, PA-C   2 puff at 03/14/14 0708  . Cyclophosphamide CAPS 50 mg  50 mg Oral Q1400 Minus Breeding, MD   50 mg at 03/13/14 2022  . dextromethorphan (DELSYM) 30 MG/5ML liquid 30 mg  30 mg Oral BID PRN Rhonda G Barrett, PA-C      . dextromethorphan-guaiFENesin (MUCINEX DM) 30-600 MG per 12 hr tablet 1 tablet  1 tablet Oral BID Evelene Croon Barrett, PA-C   1 tablet at 03/13/14 2150  . dutasteride (AVODART) capsule 0.5 mg  0.5 mg Oral QODAY Rhonda G Barrett, PA-C      . heparin ADULT  infusion 100 units/mL (25000 units/250 mL)  1,250 Units/hr Intravenous Continuous Narda Bonds, RPH 12.5 mL/hr at 03/14/14 0226 1,250 Units/hr at 03/14/14 0226  . levalbuterol (XOPENEX) nebulizer solution 0.63 mg  0.63 mg Inhalation Q4H PRN Evelene Croon Barrett, PA-C      . loratadine (CLARITIN) tablet 10 mg  10 mg Oral Daily Rhonda G Barrett, PA-C      . nitroGLYCERIN (NITROGLYN) 2 % ointment 0.5 inch  0.5 inch Topical 4 times per day Lonn Georgia, PA-C   0.5 inch at 03/14/14 0501  . nitroGLYCERIN (NITROSTAT) SL tablet 0.4 mg  0.4 mg Sublingual Q5 Min x 3 PRN Rhonda G Barrett, PA-C      . ondansetron (ZOFRAN) injection 4 mg  4 mg Intravenous Q6H PRN Rhonda G Barrett, PA-C      . pantoprazole (PROTONIX) EC tablet 40 mg  40 mg Oral Daily Rhonda G Barrett, PA-C   40 mg at 03/13/14 1606  . predniSONE (DELTASONE) tablet 10 mg  10 mg Oral Q breakfast Rhonda G Barrett, PA-C      . psyllium (HYDROCIL/METAMUCIL) packet 1 packet  1 packet Oral Daily Polly Cobia, RPH      .  simethicone (MYLICON) chewable tablet 160 mg  160 mg Oral BID Evelene Croon Barrett, PA-C   160 mg at 03/13/14 2058  . simvastatin (ZOCOR) tablet 20 mg  20 mg Oral q1800 Rhonda G Barrett, PA-C   20 mg at 03/13/14 2239  . sodium chloride 0.9 % injection 3 mL  3 mL Intravenous Q12H Rhonda G Barrett, PA-C   3 mL at 03/13/14 2102  . sodium chloride 0.9 % injection 3 mL  3 mL Intravenous PRN Rhonda G Barrett, PA-C      . sulfamethoxazole-trimethoprim (BACTRIM DS) 800-160 MG per tablet 1 tablet  1 tablet Oral Daily Evelene Croon Barrett, PA-C   1 tablet at 03/13/14 2150  . tamsulosin (FLOMAX) capsule 0.4 mg  0.4 mg Oral QPM Rhonda G Barrett, PA-C   0.4 mg at 03/13/14 2239  . traMADol (ULTRAM) tablet 50-100 mg  50-100 mg Oral Q6H PRN Rhonda G Barrett, PA-C      . zolpidem (AMBIEN) tablet 5 mg  5 mg Oral QHS PRN Lonn Georgia, PA-C        PE: General appearance: alert, cooperative and no distress Lungs: clear to auscultation bilaterally Heart:  reg rhythm.  Rate fast.  1/6 sys MM RSB Extremities: No LEE Pulses: 2+ and symmetric Skin: warm and dry Neurologic: Grossly normal  Lab Results:   Recent Labs  03/13/14 1159 03/14/14 0555  WBC 8.8 7.1  HGB 9.0* 8.7*  HCT 27.4* 26.4*  PLT 258 252   BMET  Recent Labs  03/13/14 1159 03/14/14 0555  NA 137 143  K 4.3 4.5  CL 101 107  CO2 23 23  GLUCOSE 113* 91  BUN 26* 25*  CREATININE 1.85* 1.81*  CALCIUM 8.5 8.0*   PT/INR  Recent Labs  03/13/14 1351  LABPROT 14.7  INR 1.15   Cholesterol  Recent Labs  03/14/14 0555  CHOL 112   Lipid Panel     Component Value Date/Time   CHOL 112 03/14/2014 0555   TRIG 61 03/14/2014 0555   HDL 42 03/14/2014 0555   CHOLHDL 2.7 03/14/2014 0555   VLDL 12 03/14/2014 0555   LDLCALC 58 03/14/2014 0555      Assessment/Plan    Principal Problem:   Unstable angina Active Problems:   HYPOTHYROIDISM   HYPERLIPIDEMIA   ANEMIA   Wegener's granulomatosis   Abnormal nuclear stress test: Intermediate risk with moderate region of apical and inferior scar with mild superimposed ischemia; EF 40%  Plan:  Ruled out for MI.  Lexiscan:  Moderate hypokinesis involving the mid-distal inferior wall and apex with an EF of 40%.  Moderate region of apical and inferior scar with  mild superimposed qualitative ischemia.  Cardiac cath this morning.  SCr 1.85.  Easy on the contrast.  Lipids look excellent.  BP controlled  Sinus Tach on the monitor.  115BPM.  It would be nice to use a BB but with lund disease it might make his breathing more difficult.  ?B1 selective.     Echo pending   LOS: 1 day    HAGER, BRYAN PA-C 03/14/2014 7:42 AM  Patient seen.  Agree with assessment and plan as noted above.  The patient is anemic with hemoglobin 8.7 today.  This may be contributing to his sinus tachycardia also.  We'll check an anemia panel and request stools for occult blood.  His anemia may be secondary to chronic disease of his Wegener's .Patient has  had no further chest discomfort.  Awaiting cardiac cath.  Agree with prior notes that if a stent is required, bare-metal stent may be preferable in view of his anemia.  He denied hemoptysis in the past to me upon questioning today, but prior notes indicate a past history of hemoptysis.

## 2014-03-14 NOTE — CV Procedure (Signed)
CARDIAC CATHETERIZATION REPORT  NAME:  [name]   MRN: [id number] DOB:  1936-06-06   ADMIT DATE: 03/13/2014 Procedure Date: 03/14/2014  INTERVENTIONAL CARDIOLOGIST: Leonie Man, M.D., MS PRIMARY CARE PROVIDER: [name], MD PRIMARY CARDIOLOGIST: Hilty, Kenneth C. (Mali), M.D., [facility]  PATIENT:  Richard [name] is a 78 y.o. male with a PMH significant for Wegener's Granulomatosis (followed by Dr. [name] for his severe multifocal bronchiectasis as well as numerous cavitary and non-cavitary nodules) as well as a family history of CAD with 2 brothers have a history of CABG.  He was referred to Dr. Debara Pickett for symptoms of chest tightness with significant exertion for the last few months with increasing dyspnea on exertion.   Dr. Debara Pickett ordered a nuclear stress test which demonstrated a "moderate region of apical and inferior scar with mild superimposed qualitative ischemia".  LV wall motion demonstrated a "moderate degree of hypokinesis involving the mid-distal inferior wall and apex with an EF of 40%".  Prior echo dated 03/30/2010 showed EF 45-50%.  Before he could be contacted with the results of this study, he presented to [facility] ED with increasing fatigue and chest tightness which developed this morning, which resolved within 10 minutes.  He was transferred from [facility] to [facility] for concerning symptoms of unstable angina. He is now referred for cardiac catheterization. He has a baseline creatinine of 1.8 and history of hemoptysis from Wegener's.  PRE-OPERATIVE DIAGNOSIS:    Class III Unstable Angina  Abnormal, Intermediate Risk Nuclear Stress Test  PROCEDURES PERFORMED:    Left Heart Catheterization with Native Coronary Angiography  via Right Radial Artery   PROCEDURE: The patient was brought to the 2nd [facility] in the fasting state and prepped and draped in the usual sterile fashion for Right Radial artery access. A modified  Allen's test was performed on the right wrist demonstrating adequate collateral flow for radial access.   Sterile technique was used including antiseptics, cap, gloves, gown, hand hygiene, mask and sheet. Skin prep: Chlorhexidine.   Consent: Risks of procedure as well as the alternatives and risks of each were explained to the (patient/caregiver). Consent for procedure obtained.   Time Out: Verified patient identification, verified procedure, site/side was marked, verified correct patient position, special equipment/implants available, medications/allergies/relevent history reviewed, required imaging and test results available. Performed.  Access:   Right Radial Artery: 6 Fr Sheath - modified Seldinger Technique (Angiocath Micropuncture Kit)  Radial Cocktail - 10 mL; IV Heparin 3000 Units   Left Heart Catheterization: 5 Fr Catheters advanced or exchanged over a long exchange J-wire; JR 4 catheter advanced first.  Right Coronary Artery Cineangiography: JR 4 Catheter  LV Hemodynamics: JR 4 Left Coronary Artery Cineangiography: JL 3.5 Catheter   Sheath removed in the Cath Lab with TR band placement for hemostasis.  TR Band: 1300  Hours; 15 mL air  FINDINGS:  Hemodynamics:   Central Aortic Pressure / Mean: 83/53/65 mmHg  Left Ventricular Pressure / LVEDP: 82/1/2 mmHg  Left Ventriculography: Deferred to conserve contrast  Coronary Anatomy:  Dominance: Right  Left Main: Large caliber mildly calcified vessel that trifurcates into the LAD, Ramus Intermedius, and Circumflex. No significant lesions. LAD: Normal caliber vessel with a very proximal small diagonal brainstem is major septal perforator. In its proximal segment there is a focal eccentric roughly 90% calcified appearing stenosis. The vessel then normalizes somewhat until just after a mid small diagonal branch. At this point the vessel appeared to be  subtotally occluded with almost collateral appearing distal TIMI 1 flow.  There are  several proximal septal perforators it provides collaterals to the right posterior descending artery (RPDA).  D1: Mid vessel branch is diffusely diseased but nothing significant.  Left Circumflex: Moderate caliber, nondominant vessel that gives rise to a very small AV groove branch that perfuses collaterals to the right posterior lateral system before terminating as a lateral OM it bifurcates into OM1 and 2. In the mid vessel just prior to the bifurcation the vessel is 100% subtotally occluded with TIMI 1 antegrade versus retrograde flow from collaterals perfusing both branches of the OM. There is a short segment of occlusion. The distal branches are small to moderate caliber with the superior branch being significant and providing collaterals to the right posterior lateral system  Ramus intermedius: Large caliber vessel with what appears to be a proximal 60-80% stenosis is somewhat focal. The remainder the vessel is perhaps the best angiographic vessel noted. It courses down to the apex, with minimal disease beyond the proximal lesion   RCA: Large caliber, dominant vessel with diffuse disease in the mid vessel prior to the takeoff of a major RV marginal branch that actually courses across as a posterior lateral branch that has competitive flow from left to right collaterals. The mid RCA has sequential 80-95% stenoses covering roughly 20-25 mm region.  Beyond the most distal portion of the lesions, the vessel normalizes prior to the takeoff of the RV marginal branch and then bifurcates distally into the Right Posterior AV Groove Branch (RPAV) and Right Posterior Descending Artery. Beyond the mid RCA severe disease, there minimal disease distally.  MEDICATIONS:  Anesthesia:  Local Lidocaine 2 ml  Sedation:  1 mg IV Versed, 25 mcg IV fentanyl ;   Omnipaque Contrast: 55 ml  Anticoagulation:  IV Heparin 3000 Units  PATIENT DISPOSITION:    The patient was transferred to the PACU holding area in a  hemodynamicaly stable, chest pain free condition.  The patient tolerated the procedure well, and there were no complications.  EBL:   < 5 ml  The patient was stable before, during, and after the procedure.  POST-OPERATIVE DIAGNOSIS:    Severe multivessel CAD involving all 4 major branches: 90% proximal LAD with 100% mid subtotal occlusion with minimal distal flow after the distal diagonal branch, 80-90% proximal ramus intermedius, 100% mid OM subtotal occlusion prior to bifurcation, diffuse mid RCA disease with sequential 95% stenoses as well as 50-60% stenosis prior to the major marginal branch  Known cardiomyopathy with EF of 40% by Myoview stress test  PLAN OF CARE:  Return to nursing unit for standard radial cath care  Restart heparin 6 hours after TR band removal  Start low-dose beta blocker for rate control and CAD benefit  Will review films with Interventional Colleagues in order to determine the best course of action. Theoretically CABG would be considered for multivessel disease, however in the absence of the distal LAD for lead insertion, the benefit is less appreciable.  Will consult CT surgery for their opinion, but would also consider the possibility of staged multivessel PCI with possible mechanical support.    Would keep patient in-patient pending decision to be made based on the severity of his symptoms.   Leonie Man, M.D., M.S. [facility] [address] [place], [place]  [address]  [phone]  03/14/2014 1:26 PM

## 2014-03-14 NOTE — Progress Notes (Signed)
Pre-op Cardiac Surgery  Carotid Findings:  Findings suggest 40-59% right internal carotid artery stenosis and 1-39% left internal carotid artery stenosis. Vertebral arteries are patent with antegrade flow.  Upper Extremity Right Left  Brachial Pressures Triphasic 125-Triphasic  Radial Waveforms Triphasic Triphasic  Ulnar Waveforms Triphasic Triphasic  Palmar Arch (Allen's Test) Unable to assess due to catheterization. Within normal limits.   Findings:  Palpable pedal pulses bilaterally.  03/14/2014 5:54 PM Maudry Mayhew, RVT, RDCS, RDMS

## 2014-03-14 NOTE — Care Management Note (Addendum)
  Page 2 of 2   03/26/2014     9:46:43 PM CARE MANAGEMENT NOTE 03/26/2014  Patient:  Richard [name], Richard [name]   Account Number:  [id number]  Date Initiated:  03/14/2014  Documentation initiated by:  AMERSON,JULIE  Subjective/Objective Assessment:   Pt adm on 03/13/14 with chest pain.  PTA, pt independent, lives with wife.     Action/Plan:   Will follow for dc needs as pt progresses.   Anticipated DC Date:  03/26/2014   Anticipated DC Plan:  [place]  CM consult      PAC Choice  NA   Choice offered to / List presented to:  NA           Status of service:  Completed, signed off Medicare Important Message given?  YES (If response is "NO", the following Medicare IM given date fields will be blank) Date Medicare IM given:  03/24/2014 Medicare IM given by:  Latrisa Hellums Date Additional Medicare IM given:  03/21/2014 Additional Medicare IM given by:  [facility] DOWELL  Discharge Disposition:    Per UR Regulation:  Reviewed for med. necessity/level of care/duration of stay  If discussed at [place] of Stay Meetings, dates discussed:   03/18/2014  03/20/2014  03/25/2014  03/27/2014    Comments:  Donella Stade Joelynn Dust RN, BSN, MSHL, CCM  Nurse - Case Manager,  (Unit [facility])  [phone]  03/26/2014 PT RECS:  None Disposition:  Home self care with OP Pulmonary Rehab   Mariann Laster RN, BSN, MSHL, CCM  Nurse - Case Manager,  (Unit [phone]  03/24/2014 Hx/o Anemia: subsequent to right femoral pseudoaneurysm. Post surgical repaiir and S/p blood transfusion x 2. Hgb down over past 24 hours  7.6 today. Hematoma down buttox and hamstring Disposition Plan:  Pending  03/24/2014 1354  Date Additional Medicare IM given: 03/24/2014 Additional Medicare IM given by:  Lenox Ponds RN CCM Case Mgmt phone [phone]    Mariann Laster RN, BSN, [place], CCM  Nurse - Case Manager, (Unit [phone]  03/18/2014 Social:  From home  with wife. HOme DME:  Cane and walker in the home but has not needed prior to this admission. Med Review:  Plavix Patient and wife elect to d/c without any HHS - feels doing ok. CM encouraged if problems or concerns once home; please notify PCP or Cardiologist who can assist with needs. Disposition Plan:  Home / Self Care  03/17/14 Ellan Lambert, RN, BSN [phone] PCI scheduled for today.

## 2014-03-14 NOTE — Interval H&P Note (Signed)
History and Physical Interval Note:  03/14/2014 11:19 AM  Very pleasant 78 yr old male with a PMH significant for Wegener's granulomatosis and follows with Dr. [name] for his pulmonary disease (severe multifocal bronchiectasis  as well as numerous cavitary and non-cavitary nodules).  Has been having chest tightness with significant exertion for the last few months with increasing dyspnea on exertion.  Also had 2 brothers who both had CABG.  Referred to Dr. Debara Pickett who proceeded with nuclear stress testing which demonstrated a "moderate region of apical and inferior scar with mild superimposed qualitative ischemia".  LV wall motion demonstrated a "moderate degree of hypokinesis involving the mid-distal inferior wall and apex with an EF of 40%".  Prior echo dated 03/30/2010 showed EF 45-50%.  Presented to [facility] ED with increasing fatigue and chest tightness which developed this morning, which resolved within 10 minutes.  He was given ASA, heparin, and nitro paste.  ECG demonstrated sinus tachycardia with no acute ST-T abnormalities. POC troponin normal, serum troponins pending.  Also noted to be anemic, Hgb 9 (10 on 12/06/13).  He currently denies chest pain and is sitting comfortably upright in the bed with several family members present.   Richard [name]  has presented today for surgery, with the diagnosis of Unstable Angian & Abnormal Nuclear Stress Test.    The various methods of treatment have been discussed with the patient and family. After consideration of risks, benefits and other options for treatment, the patient has consented to  Procedure(s): LEFT HEART CATHETERIZATION WITH CORONARY ANGIOGRAM (N/A) +/- PCI as a surgical intervention .  The patient's history has been reviewed, patient examined, no change in status, stable for surgery.  I have reviewed the patient's chart and labs.  Questions were answered to the patient's satisfaction.     Richard [name]  Cath Lab Visit (complete for each  Cath Lab visit)  Clinical Evaluation Leading to the Procedure:   ACS: Yes.    Non-ACS:    Anginal Classification: CCS III  Anti-ischemic medical therapy: No Therapy  Non-Invasive Test Results: Intermediate-risk stress test findings: cardiac mortality 1-3%/year  Prior CABG: No previous CABG

## 2014-03-14 NOTE — Progress Notes (Signed)
ANTICOAGULATION CONSULT NOTE - Initial Consult  Pharmacy Consult for Heparin Indication: chest pain/ACS  Allergies  Allergen Reactions  . Factive [Gemifloxacin]     Ineffective     Patient Measurements: Height: 5\' 7"  (170.2 cm) Weight: 117 lb 12.8 oz (53.434 kg) IBW/kg (Calculated) : 66.1 Heparin Dosing Weight: 53.4 kg  Vital Signs: Temp: 98.9 F (37.2 C) (08/07 0432) Temp src: Oral (08/07 0432) BP: 129/73 mmHg (08/07 0432) Pulse Rate: 118 (08/07 1221)  Labs:  Recent Labs  03/13/14 1159 03/13/14 1351 03/13/14 1851 03/13/14 2351 03/14/14 0555  HGB 9.0*  --   --   --  8.7*  HCT 27.4*  --   --   --  26.4*  PLT 258  --   --   --  252  APTT  --  36  --   --   --   LABPROT  --  14.7  --   --   --   INR  --  1.15  --   --   --   HEPARINUNFRC  --   --   --  0.25*  --   CREATININE 1.85*  --   --   --  1.81*  TROPONINI  --   --  <0.30 <0.30 <0.30    Estimated Creatinine Clearance: 26.6 ml/min (by C-G formula based on Cr of 1.81).   Medical History: Past Medical History  Diagnosis Date  . Allergy   . GERD (gastroesophageal reflux disease)   . Arthritis   . Asthma   . Fungal infection     lungs  . Cataract   . Ulcer   . BPH (benign prostatic hyperplasia)     sees Dr. Risa Grill, biopsy June 2015 was benign   . Wegener's granulomatosis 2011    sees Dr. [name]   . Peptic stricture of esophagus   . Anemia   . Hiatal hernia   . Adenomatous colon polyp 08/2011  . Bronchiectasis   . On home oxygen therapy 12-18-12    uses 2 l/m nasally at bedtime  . HOH (hard of hearing) 12-18-12    bilaterally  . HBP (high blood pressure) 09/28/2013   Assessment: 50 YOM admitted for ACS and placed on IV heparin. Heparin was discontinued for cardiac cath. Patient is now status post cardiac cath found to have severe muti-vessel CAD with consult to CT Surgery for CABG versus staged multivessel PCI to resume IV heparin 8 hours post sheath removal. Sheath was removed at 1300 PM and TR band  placed with 15 cc. Spoke with RN- TR band to be fully deflated at 1530 PM with no bleeding or hematoma noted. Current CrCl ~ 25-30 mL/min. H/H 8.7/26.4, Platelets 252.  Patient was on 1100 units/hr prior to procedure with almost therapeutic level and now high bleed risk.   Goal of Therapy:  Heparin level 0.3-0.7 units/ml Monitor platelets by anticoagulation protocol: Yes   Plan:  1. Restart IV heparin at 1100 units/hr at 2330 (No bolus due to recent procedure). 2. Will check heparin level 8 hours after restart of infusion.  3. Daily heparin level and CBC while on therapy.   Sloan Leiter, PharmD, BCPS Clinical Pharmacist [phone] 03/14/2014,3:07 PM

## 2014-03-14 NOTE — Progress Notes (Signed)
Spoke with Dr. [name] re: risk of DAPT.  Dr. [name] noted that Mr. Mendolia has had hemoptysis in the past, therefore limiting DAPT would be a plus. If can use BMS instead of DES, this would likely be best option.   Rosaria Ferries, PA-C 03/14/2014 7:53 AM Beeper [phone]   Thank you,    Leonie Man, MD

## 2014-03-14 NOTE — Progress Notes (Signed)
ANTICOAGULATION CONSULT NOTE - Follow Up Consult  Pharmacy Consult for Heparin  Indication: chest pain/ACS  Allergies  Allergen Reactions  . Factive [Gemifloxacin]     Ineffective     Patient Measurements: Height: 5\' 7"  (170.2 cm) Weight: 123 lb 14.4 oz (56.2 kg) IBW/kg (Calculated) : 66.1  Vital Signs: Temp: 98.2 F (36.8 C) (08/06 2121) Temp src: Oral (08/06 2121) BP: 116/66 mmHg (08/06 2121) Pulse Rate: 91 (08/06 2121)  Labs:  Recent Labs  03/13/14 1159 03/13/14 1351 03/13/14 1851 03/13/14 2351  HGB 9.0*  --   --   --   HCT 27.4*  --   --   --   PLT 258  --   --   --   APTT  --  36  --   --   LABPROT  --  14.7  --   --   INR  --  1.15  --   --   HEPARINUNFRC  --   --   --  0.25*  CREATININE 1.85*  --   --   --   TROPONINI  --   --  <0.30 <0.30    Estimated Creatinine Clearance: 27.4 ml/min (by C-G formula based on Cr of 1.85).   Medications:  Heparin 1100 units/hr  Assessment: 78 y/o M with sub-therapeutic HL of 0.25. Other labs as above.   Goal of Therapy:  Heparin level 0.3-0.7 units/ml Monitor platelets by anticoagulation protocol: Yes   Plan:  -Increase heparin to 1250 units/hr -1000 HL -Daily CBC/HL -Monitor for bleeding  Narda Bonds 03/14/2014,1:28 AM

## 2014-03-15 DIAGNOSIS — R0902 Hypoxemia: Secondary | ICD-10-CM

## 2014-03-15 DIAGNOSIS — I369 Nonrheumatic tricuspid valve disorder, unspecified: Secondary | ICD-10-CM

## 2014-03-15 LAB — IRON AND TIBC
Iron: 12 ug/dL — ABNORMAL LOW (ref 42–135)
SATURATION RATIOS: 8 % — AB (ref 20–55)
TIBC: 149 ug/dL — ABNORMAL LOW (ref 215–435)
UIBC: 137 ug/dL (ref 125–400)

## 2014-03-15 LAB — CBC
HCT: 24 % — ABNORMAL LOW (ref 39.0–52.0)
Hemoglobin: 7.9 g/dL — ABNORMAL LOW (ref 13.0–17.0)
MCH: 33.2 pg (ref 26.0–34.0)
MCHC: 32.9 g/dL (ref 30.0–36.0)
MCV: 100.8 fL — ABNORMAL HIGH (ref 78.0–100.0)
PLATELETS: 238 10*3/uL (ref 150–400)
RBC: 2.38 MIL/uL — ABNORMAL LOW (ref 4.22–5.81)
RDW: 15.9 % — AB (ref 11.5–15.5)
WBC: 6.5 10*3/uL (ref 4.0–10.5)

## 2014-03-15 LAB — RETICULOCYTES
RBC.: 2.3 MIL/uL — AB (ref 4.22–5.81)
RETIC COUNT ABSOLUTE: 34.5 10*3/uL (ref 19.0–186.0)
RETIC CT PCT: 1.5 % (ref 0.4–3.1)

## 2014-03-15 LAB — FERRITIN: Ferritin: 359 ng/mL — ABNORMAL HIGH (ref 22–322)

## 2014-03-15 LAB — VITAMIN B12: Vitamin B-12: 386 pg/mL (ref 211–911)

## 2014-03-15 LAB — HEPARIN LEVEL (UNFRACTIONATED)
Heparin Unfractionated: 0.16 IU/mL — ABNORMAL LOW (ref 0.30–0.70)
Heparin Unfractionated: 0.33 IU/mL (ref 0.30–0.70)

## 2014-03-15 LAB — FOLATE: FOLATE: 13.1 ng/mL

## 2014-03-15 MED ORDER — HEPARIN (PORCINE) IN NACL 100-0.45 UNIT/ML-% IJ SOLN
1350.0000 [IU]/h | INTRAMUSCULAR | Status: DC
  Administered 2014-03-15: 1250 [IU]/h via INTRAVENOUS
  Administered 2014-03-16: 1350 [IU]/h via INTRAVENOUS
  Administered 2014-03-16: 1400 [IU]/h via INTRAVENOUS
  Administered 2014-03-17: 1350 [IU]/h via INTRAVENOUS
  Filled 2014-03-15 (×6): qty 250

## 2014-03-15 NOTE — Progress Notes (Signed)
ANTICOAGULATION CONSULT NOTE - Follow Up Consult  Pharmacy Consult for heparin Indication: chest pain/ACS  Allergies  Allergen Reactions  . Factive [Gemifloxacin]     Ineffective     Patient Measurements: Height: 5\' 7"  (170.2 cm) Weight: 119 lb 0.8 oz (54 kg) IBW/kg (Calculated) : 66.1  Vital Signs: Temp: 98.4 F (36.9 C) (08/08 1459) Temp src: Oral (08/08 1459) BP: 100/65 mmHg (08/08 1459) Pulse Rate: 93 (08/08 1459)  Labs:  Recent Labs  03/13/14 1159 03/13/14 1351 03/13/14 1851 03/13/14 2351 03/14/14 0555 03/15/14 0759 03/15/14 1725  HGB 9.0*  --   --   --  8.7* 7.9*  --   HCT 27.4*  --   --   --  26.4* 24.0*  --   PLT 258  --   --   --  252 238  --   APTT  --  36  --   --   --   --   --   LABPROT  --  14.7  --   --   --   --   --   INR  --  1.15  --   --   --   --   --   HEPARINUNFRC  --   --   --  0.25*  --  0.16* 0.33  CREATININE 1.85*  --   --   --  1.81*  --   --   TROPONINI  --   --  <0.30 <0.30 <0.30  --   --     Estimated Creatinine Clearance: 26.9 ml/min (by C-G formula based on Cr of 1.81).   Medications:  Scheduled:  . aspirin EC  81 mg Oral QPM  . B-complex with vitamin C  1 tablet Oral Daily  . bisoprolol  2.5 mg Oral Daily  . budesonide-formoterol  2 puff Inhalation BID  . Cyclophosphamide  50 mg Oral Q1400  . dextromethorphan-guaiFENesin  1 tablet Oral BID  . dutasteride  0.5 mg Oral QODAY  . loratadine  10 mg Oral Daily  . nitroGLYCERIN  1 inch Topical 4 times per day  . pantoprazole  40 mg Oral Daily  . predniSONE  10 mg Oral Q breakfast  . psyllium  1 packet Oral Daily  . simethicone  160 mg Oral BID  . simvastatin  20 mg Oral q1800  . sodium chloride  3 mL Intravenous Q12H  . sodium chloride  3 mL Intravenous Q12H  . sulfamethoxazole-trimethoprim  1 tablet Oral Daily  . tamsulosin  0.4 mg Oral QPM   Infusions:  . sodium chloride 40 mL/hr at 03/13/14 2047  . heparin 1,250 Units/hr (03/15/14 0915)    Assessment: 28 YOM  admitted for ACS. Patient is now status post cardiac cath found to have severe muti-vessel CAD with consult to CT Surgery for CABG versus staged multivessel PCI. Heparin was restarted post cath last night.  Heparin level now therapeutic   Goal of Therapy:  Heparin level 0.3-0.7 units/ml Monitor platelets by anticoagulation protocol: Yes   Plan: -Continue heparin at 1250 units / hr -Follow up AM labs  Thank you. Anette Guarneri, PharmD [phone]   03/15/2014 7:41 PM

## 2014-03-15 NOTE — Progress Notes (Signed)
Echocardiogram 2D Echocardiogram has been performed.  Richard [name] M 03/15/2014, 10:49 AM

## 2014-03-15 NOTE — Consult Note (Signed)
PULMONARY / CRITICAL CARE MEDICINE   Name: Richard [name] MRN: [id number] DOB: [birth year]    ADMISSION DATE:  03/13/2014 CONSULTATION DATE:  03/15/14  REFERRING MD :  Dr Servando Snare  CHIEF COMPLAINT:  Dyspnea/cough  INITIAL PRESENTATION:  68 yowm never smoker with dx of Longstanding bronchiectasis then dx with WG 03/2010    Admit [facility] dx WG by pos c anca 03/2010, supportive tbbx but not vats  Admit [name] dx spont L Ptx 9/08/15/09 requiring Chest tube but no surgery  Progressive doe x months and found to have iscemic CM needing stent vs cabg and PCCM asked to risk assess.    STUDIES:   [facility] 03/14/14  With LVEDP 2   SIGNIFICANT EVENTS:     HISTORY OF PRESENT ILLNESS:    Progressive doe x months s increaesed cough and no epistaxis or hemoptysis during that time or ever to pt's memory. Sob x greater slow adls but not at rest or noct.  No ex cp  No obvious day to day or daytime variabilty or assoc   chest tightness, subjective wheeze overt sinus or hb symptoms. No unusual exp hx or h/o childhood pna/ asthma or knowledge of premature birth.  Sleeping ok without nocturnal  or early am exacerbation  of respiratory  c/o's or need for noct saba. Also denies any obvious fluctuation of symptoms with weather or environmental changes or other aggravating or alleviating factors except as outlined above   Current Medications, Allergies, Complete Past Medical History, Past Surgical History, Family History, and Social History were reviewed in [employer] record.  ROS  The following are not active complaints unless bolded sore throat, dysphagia, dental problems, itching, sneezing,  nasal congestion or excess/ purulent secretions, ear ache,   fever, chills, sweats, unintended wt loss, pleuritic or exertional cp, hemoptysis,  orthopnea pnd or leg swelling, presyncope, palpitations, heartburn, abdominal pain, anorexia, nausea, vomiting, diarrhea  or change in bowel or urinary habits, change in  stools or urine, dysuria,hematuria,  rash, arthralgias, visual complaints, headache, numbness weakness or ataxia or problems with walking or coordination,  change in mood/affect or memory.       PAST MEDICAL HISTORY :  Past Medical History  Diagnosis Date  . Allergy   . GERD (gastroesophageal reflux disease)   . Arthritis   . Asthma   . Fungal infection     lungs  . Cataract   . Ulcer   . BPH (benign prostatic hyperplasia)     sees Dr. Risa Grill, biopsy June 2015 was benign   . Wegener's granulomatosis 2011    sees Dr. [name]   . Peptic stricture of esophagus   . Anemia   . Hiatal hernia   . Adenomatous colon polyp 08/2011  . Bronchiectasis   . On home oxygen therapy 12-18-12    uses 2 l/m nasally at bedtime  . HOH (hard of hearing) 12-18-12    bilaterally  . HBP (high blood pressure) 09/28/2013   Past Surgical History  Procedure Laterality Date  . Hernia repair    . Eye surgery      cataracts removed.   . Colonoscopy  08-16-11    per Dr. Fuller Plan, diverticulosis and polyps, repeat in 5 yrs   . Esophagogastroduodenoscopy (egd) with esophageal dilation  11-29-10    per Dr. Fuller Plan   . Cataract extraction, bilateral  12-18-12    bilateral  . Cholecystectomy N/A 12/21/2012    Procedure: LAPAROSCOPIC CHOLECYSTECTOMY WITH INTRAOPERATIVE CHOLANGIOGRAM;  Surgeon: Marland Kitchen T  Hoxworth, MD;  Location: [facility];  Service: General;  Laterality: N/A;   Prior to Admission medications   Medication Sig Start Date End Date Taking? Authorizing Provider  acetaminophen (TYLENOL) 325 MG tablet Take 650 mg by mouth every 6 (six) hours as needed for mild pain.   Yes Historical Provider, MD  aspirin 81 MG tablet Take 81 mg by mouth every evening.    Yes Historical Provider, MD  b complex vitamins tablet Take 1 tablet by mouth daily.     Yes Historical Provider, MD  BIOTIN PO Take 1 tablet by mouth 3 (three) times daily with meals.   Yes Historical Provider, MD  bisacodyl (DULCOLAX) 5 MG EC tablet Take 5 mg by  mouth daily as needed. CONSTIPATION   Yes Historical Provider, MD  budesonide-formoterol (SYMBICORT) 160-4.5 MCG/ACT inhaler Inhale 2 puffs into the lungs 2 (two) times daily.   Yes Historical Provider, MD  cyclophosphamide (CYTOXAN) 50 MG tablet Take 50 mg by mouth daily. Give on an empty stomach once daily at 1600. Take  1 hour before or 2 hours after meals. 10/24/13  Yes Tanda Rockers, MD  dextromethorphan (DELSYM) 30 MG/5ML liquid Take 30 mg by mouth 2 (two) times daily as needed for cough.   Yes Historical Provider, MD  dextromethorphan-guaiFENesin (MUCINEX DM) 30-600 MG per 12 hr tablet Take 1 tablet by mouth 2 (two) times daily.    Yes Historical Provider, MD  dutasteride (AVODART) 0.5 MG capsule Take 0.5 mg by mouth every other day. In the evening.   Yes Historical Provider, MD  esomeprazole (NEXIUM) 20 MG capsule Take 20 mg by mouth 2 (two) times daily before a meal.    Yes Historical Provider, MD  levalbuterol (XOPENEX HFA) 45 MCG/ACT inhaler Inhale 1-2 puffs into the lungs every 4 (four) hours as needed for wheezing. 04/26/13  Yes Tanda Rockers, MD  loratadine (CLARITIN) 10 MG tablet Take 10 mg by mouth daily.    Yes Historical Provider, MD  methylcellulose (CITRUCEL) oral powder Take 1 packet by mouth daily.   Yes Historical Provider, MD  predniSONE (DELTASONE) 10 MG tablet Take 10 mg by mouth daily with breakfast.   Yes Historical Provider, MD  Simethicone (GAS-X PO) Take 2 tablets by mouth 2 (two) times daily.    Yes Historical Provider, MD  sulfamethoxazole-trimethoprim (BACTRIM DS) 800-160 MG per tablet Take 1 tablet by mouth daily. Take with Cytoxan.   Yes Historical Provider, MD  tamsulosin (FLOMAX) 0.4 MG CAPS capsule Take 1 capsule (0.4 mg total) by mouth every evening. 02/18/14  Yes [name], MD  traMADol Veatrice Bourbon) 50 MG tablet 1-2 every 4 hours as needed for cough or pain 09/12/13  Yes Tanda Rockers, MD   Allergies  Allergen Reactions  . Factive [Gemifloxacin]      Ineffective     FAMILY HISTORY:  Family History  Problem Relation Age of Onset  . Asthma Father   . Coronary artery disease Brother   . Coronary artery disease Brother   . Coronary artery disease Mother    SOCIAL HISTORY:  reports that he has never smoked. He has never used smokeless tobacco. He reports that he does not drink alcohol or use illicit drugs.   SUBJECTIVE:  nad at rest on NP 3lpm  VITAL SIGNS: Temp:  [97.6 F (36.4 C)-100.7 F (38.2 C)] 97.6 F (36.4 C) (08/08 0349) Pulse Rate:  [97-120] 101 (08/08 0809) Resp:  [18] 18 (08/08 0809) BP: (114-129)/(58-71) 114/65 mmHg (  08/08 0349) SpO2:  [97 %-100 %] 97 % (08/08 0809) Weight:  [119 lb 0.8 oz (54 kg)] 119 lb 0.8 oz (54 kg) (08/08 0349) HEMODYNAMICS:   VENTILATOR SETTINGS:   INTAKE / OUTPUT:  Intake/Output Summary (Last 24 hours) at 03/15/14 1339 Last data filed at 03/15/14 0700  Gross per 24 hour  Intake    240 ml  Output    100 ml  Net    140 ml    PHYSICAL EXAMINATION:  Pt alert, approp nad rest on 02  No jvd Oropharanx clear Neck supple Lungs with a few scattered exp > insp rhonchi bilaterally RRR no s3 or or sign murmur Abd obese with excursion >> Extr wam with no edema or clubbing noted Neuro  No motor deficits   LABS:  CBC  Recent Labs Lab 03/13/14 1159 03/14/14 0555 03/15/14 0759  WBC 8.8 7.1 6.5  HGB 9.0* 8.7* 7.9*  HCT 27.4* 26.4* 24.0*  PLT 258 252 238   Coag's  Recent Labs Lab 03/13/14 1351  APTT 36  INR 1.15   BMET  Recent Labs Lab 03/13/14 1159 03/14/14 0555  NA 137 143  K 4.3 4.5  CL 101 107  CO2 23 23  BUN 26* 25*  CREATININE 1.85* 1.81*  GLUCOSE 113* 91   Electrolytes  Recent Labs Lab 03/13/14 1159 03/13/14 1851 03/14/14 0555  CALCIUM 8.5  --  8.0*  MG  --  2.2  --    Sepsis Markers No results found for this basename: LATICACIDVEN, PROCALCITON, O2SATVEN,  in the last 168 hours ABG No results found for this basename: PHART, PCO2ART, PO2ART,   in the last 168 hours Liver Enzymes  Recent Labs Lab 03/14/14 0555  AST 11  ALT 7  ALKPHOS 78  BILITOT <0.2*  ALBUMIN 2.5*   Cardiac Enzymes  Recent Labs Lab 03/13/14 1851 03/13/14 2351 03/14/14 0555  TROPONINI <0.30 <0.30 <0.30   Glucose No results found for this basename: GLUCAP,  in the last 168 hours  Imaging No results found.   ASSESSMENT / PLAN:  1) Bronchiectasis with airflow obst    -  HFA 90% p coaching 12/16/2010   -   PFT's 06/21/11   FEV1  1.60 (61%) ratio 48 and 10% better p B2,  DLCO 83%  - CT 10/04/13 severe multifocal bronchiectasis as well as numerous cavitary and      non-cavitary nodules  -   PFT's 11/22/2012 FEV1  1.52 (62%) arnd 46 and no change p B2 DLCO 89%  -    VEST rec 02/12/2014 > improved  He is very poor functional status with persistent severe mucociliary dysfunction so high risk post op atx/ hcap/ chronic vent failure post op and poor wound healing  2) WG     - Dx by TBBX/ pos anca 03/2010 > CTX started    - Off all prednisone with baseline esr 31 11/16/2010  > restarted 12/16/2010 with nausea and wt down to 115 but ok sats walking x 3 laps   - Recheck ANCA sent  12/16/2010  >  neg   -Trial off cytoxan starting 02/12/2011 >   03/16/11 stopped  - Prednisone ceiling is 10 mg per day and floor is 5 mg daily as of 04/26/13  - 08/19/13 ANCA POS 1:80 with increasing nodular dz > rx 08/21/2013 cytoxan 50 mg daily  - Started bactrim ds one daily 10/07/13 - ESR up 11/07/13 rec double pred to 20 mg until feeling better then floor  of 10 mg > esr 48  02/12/14 on 10 mg pred daily   3) ischemic HD with mild cardiomyopathy does explain his recent decline.  Extended conversation re risks > prefers stent if possible but willing to consider cabg if absolutely needed    Christinia Gully, MD [facility] [phone] After 5:30 PM or weekends, call [phone]  03/15/2014, 1:39 PM

## 2014-03-15 NOTE — Progress Notes (Signed)
ANTICOAGULATION CONSULT NOTE - Follow Up Consult  Pharmacy Consult for heparin Indication: chest pain/ACS  Allergies  Allergen Reactions  . Factive [Gemifloxacin]     Ineffective     Patient Measurements: Height: 5\' 7"  (170.2 cm) Weight: 119 lb 0.8 oz (54 kg) IBW/kg (Calculated) : 66.1  Vital Signs: Temp: 97.6 F (36.4 C) (08/08 0349) Temp src: Oral (08/08 0349) BP: 114/65 mmHg (08/08 0349) Pulse Rate: 101 (08/08 0809)  Labs:  Recent Labs  03/13/14 1159 03/13/14 1351 03/13/14 1851 03/13/14 2351 03/14/14 0555 03/15/14 0759  HGB 9.0*  --   --   --  8.7* 7.9*  HCT 27.4*  --   --   --  26.4* 24.0*  PLT 258  --   --   --  252 238  APTT  --  36  --   --   --   --   LABPROT  --  14.7  --   --   --   --   INR  --  1.15  --   --   --   --   HEPARINUNFRC  --   --   --  0.25*  --  0.16*  CREATININE 1.85*  --   --   --  1.81*  --   TROPONINI  --   --  <0.30 <0.30 <0.30  --     Estimated Creatinine Clearance: 26.9 ml/min (by C-G formula based on Cr of 1.81).   Medications:  Scheduled:  . aspirin EC  81 mg Oral QPM  . B-complex with vitamin C  1 tablet Oral Daily  . bisoprolol  2.5 mg Oral Daily  . budesonide-formoterol  2 puff Inhalation BID  . Cyclophosphamide  50 mg Oral Q1400  . dextromethorphan-guaiFENesin  1 tablet Oral BID  . dutasteride  0.5 mg Oral QODAY  . loratadine  10 mg Oral Daily  . nitroGLYCERIN  1 inch Topical 4 times per day  . pantoprazole  40 mg Oral Daily  . predniSONE  10 mg Oral Q breakfast  . psyllium  1 packet Oral Daily  . simethicone  160 mg Oral BID  . simvastatin  20 mg Oral q1800  . sodium chloride  3 mL Intravenous Q12H  . sodium chloride  3 mL Intravenous Q12H  . sulfamethoxazole-trimethoprim  1 tablet Oral Daily  . tamsulosin  0.4 mg Oral QPM   Infusions:  . sodium chloride 40 mL/hr at 03/13/14 2047  . heparin 1,100 Units/hr (03/15/14 0002)    Assessment: 53 YOM admitted for ACS. Patient is now status post cardiac cath found  to have severe muti-vessel CAD with consult to CT Surgery for CABG versus staged multivessel PCI. Heparin was restarted post cath last night and heparin level is 0.16 and below goal. Noted per CVTS that CABG is not recommended in this setting.  Goal of Therapy:  Heparin level 0.3-0.7 units/ml Monitor platelets by anticoagulation protocol: Yes   Plan: -Increase heparin to 1250 units/hr -Heparin level in 6 hours and daily wth CBC daily  Hildred Laser, Pharm D 03/15/2014 9:03 AM

## 2014-03-15 NOTE — Progress Notes (Signed)
    SUBJECTIVE:  Chest pain last night improved after he had NTG paste.     PHYSICAL EXAM Filed Vitals:   03/14/14 1500 03/14/14 2034 03/15/14 0349 03/15/14 0809  BP: 129/71 125/58 114/65   Pulse: 97 120 107 101  Temp: 98 F (36.7 C) 100.7 F (38.2 C) 97.6 F (36.4 C)   TempSrc: Oral Oral Oral   Resp: 18 18 18 18   Height:      Weight:   119 lb 0.8 oz (54 kg)   SpO2: 99% 98% 100% 97%   General:  No distress Lungs:  Clear Heart:  RRR Abdomen:  Positive bowel sounds, no rebound no guarding Extremities:  Right wrist OK.  No edema  LABS: Lab Results  Component Value Date   TROPONINI <0.30 03/14/2014   Results for orders placed during the hospital encounter of 03/13/14 (from the past 24 hour(s))  RETICULOCYTES     Status: Abnormal   Collection Time    03/15/14  3:43 AM      Result Value Ref Range   Retic Ct Pct 1.5  0.4 - 3.1 %   RBC. 2.30 (*) 4.22 - 5.81 MIL/uL   Retic Count, Manual 34.5  19.0 - 186.0 K/uL  CBC     Status: Abnormal   Collection Time    03/15/14  7:59 AM      Result Value Ref Range   WBC 6.5  4.0 - 10.5 K/uL   RBC 2.38 (*) 4.22 - 5.81 MIL/uL   Hemoglobin 7.9 (*) 13.0 - 17.0 g/dL   HCT 24.0 (*) 39.0 - 52.0 %   MCV 100.8 (*) 78.0 - 100.0 fL   [name] 33.2  26.0 - 34.0 pg   MCHC 32.9  30.0 - 36.0 g/dL   RDW 15.9 (*) 11.5 - 15.5 %   Platelets 238  150 - 400 K/uL  HEPARIN LEVEL (UNFRACTIONATED)     Status: Abnormal   Collection Time    03/15/14  7:59 AM      Result Value Ref Range   Heparin Unfractionated 0.16 (*) 0.30 - 0.70 IU/mL    Intake/Output Summary (Last 24 hours) at 03/15/14 1105 Last data filed at 03/15/14 0700  Gross per 24 hour  Intake    240 ml  Output    100 ml  Net    140 ml    ASSESSMENT AND PLAN:  CAD:  Multivessel as described.  Felt to be too high risk for CABG because of underlying comorbid conditions.  Per Dr. Allison Quarry note we could consider high risk angioplasty.  Continue heparin today.   HTN:  BP controlled.  Continue  current therapy.    Sinus tachycardia:   He seems to be tolerating a low dose of beta blocker.  I will continue and titrate up as BP allows.    Jeneen Rinks [facility] 03/15/2014 11:05 AM

## 2014-03-15 NOTE — Progress Notes (Signed)
Patient ID: Richard [name], male   DOB: 12-15-1935, 78 y.o.   MRN: [id number]      [address]       [place] [address]             [phone]                 1 Day Post-Op Procedure(s) (LRB): LEFT HEART CATHETERIZATION WITH CORONARY ANGIOGRAM (N/A)  LOS: 2 days   Subjective: Feels and looks better this am, not as much respiratory effort at rest currently  Objective: Vital signs in last 24 hours: Patient Vitals for the past 24 hrs:  BP Temp Temp src Pulse Resp SpO2 Weight  03/15/14 0809 - - - 101 18 97 % -  03/15/14 0349 114/65 mmHg 97.6 F (36.4 C) Oral 107 18 100 % 119 lb 0.8 oz (54 kg)  03/14/14 2034 125/58 mmHg 100.7 F (38.2 C) Oral 120 18 98 % -  03/14/14 1500 129/71 mmHg 98 F (36.7 C) Oral 97 18 99 % -  03/14/14 1221 - - - 118 - - -    Filed Weights   03/13/14 1745 03/14/14 0432 03/15/14 0349  Weight: 123 lb 14.4 oz (56.2 kg) 117 lb 12.8 oz (53.434 kg) 119 lb 0.8 oz (54 kg)    Hemodynamic parameters for last 24 hours:    Intake/Output from previous day: 08/07 0701 - 08/08 0700 In: 240 [P.O.:240] Out: 100 [Urine:100] Intake/Output this shift:    Scheduled Meds: . aspirin EC  81 mg Oral QPM  . B-complex with vitamin C  1 tablet Oral Daily  . bisoprolol  2.5 mg Oral Daily  . budesonide-formoterol  2 puff Inhalation BID  . Cyclophosphamide  50 mg Oral Q1400  . dextromethorphan-guaiFENesin  1 tablet Oral BID  . dutasteride  0.5 mg Oral QODAY  . loratadine  10 mg Oral Daily  . nitroGLYCERIN  1 inch Topical 4 times per day  . pantoprazole  40 mg Oral Daily  . predniSONE  10 mg Oral Q breakfast  . psyllium  1 packet Oral Daily  . simethicone  160 mg Oral BID  . simvastatin  20 mg Oral q1800  . sodium chloride  3 mL Intravenous Q12H  . sodium chloride  3 mL Intravenous Q12H  . sulfamethoxazole-trimethoprim  1 tablet Oral Daily  . tamsulosin  0.4 mg Oral QPM   Continuous Infusions: . sodium chloride 40 mL/hr at 03/13/14 2047  .  heparin 1,250 Units/hr (03/15/14 0915)   PRN Meds:.sodium chloride, sodium chloride, acetaminophen, ALPRAZolam, bisacodyl, dextromethorphan, levalbuterol, nitroGLYCERIN, ondansetron (ZOFRAN) IV, sodium chloride, traMADol, zolpidem  General appearance: alert, cooperative, cachectic, fatigued, no distress and pale Neurologic: intact Heart: regular rate and rhythm, S1, S2 normal, no murmur, click, rub or gallop Lungs: diminished breath sounds bilaterally Abdomen: soft, non-tender; bowel sounds normal; no masses,  no organomegaly Extremities: extremities normal, atraumatic, no cyanosis or edema and Homans sign is negative, no sign of DVT  Lab Results: CBC: Recent Labs  03/14/14 0555 03/15/14 0759  WBC 7.1 6.5  HGB 8.7* 7.9*  HCT 26.4* 24.0*  PLT 252 238   BMET:  Recent Labs  03/13/14 1159 03/14/14 0555  NA 137 143  K 4.3 4.5  CL 101 107  CO2 23 23  GLUCOSE 113* 91  BUN 26* 25*  CREATININE 1.85* 1.81*  CALCIUM 8.5 8.0*    PT/INR:  Recent Labs  03/13/14 1351  LABPROT 14.7  INR 1.15  Radiology No results found.  Chronic Kidney Disease    Stage IV   GFR 15-29    Lab Results  Component Value Date   CREATININE 1.81* 03/14/2014   Estimated Creatinine Clearance: 26.9 ml/min (by C-G formula based on Cr of 1.81).   Assessment/Plan: S/P Procedure(s) (LRB): LEFT HEART CATHETERIZATION WITH CORONARY ANGIOGRAM (N/A) Discussed with patient further cabg vs angioplasty, as noted very poor candidate for cabg ( sts score >30) Patient willing to proceed with stents, has questions about bare metal  vs drug coated   Grace Isaac MD 03/15/2014 11:39 AM

## 2014-03-15 NOTE — Consult Note (Signed)
[address]       [place] [address]             [phone]        Richard [name] [place] Medical Record #[id number] Date of Birth: [birth year]  Referring: Dr Ellyn Hack  Primary Care: [name], MD  Chief Complaint:    Chief Complaint  Patient presents with  . Shortness of Breath    History of Present Illness:     Patient with known history of  Wegener's granulomatosis and follows with Dr. [name] for his pulmonary disease (severe multifocal bronchiectasis as well as numerous cavitary and non-cavitary nodules).  Since Feb of this year he has had worsening symptoms of SOB at rest and with exercise , greater then 20 lb wt loss and general decline in functional status.  He wasReferred to Dr. Debara Pickett who proceeded with nuclear stress testing which demonstrated a "moderate region of apical and inferior scar with mild superimposed qualitative ischemia". Done on 03/12/2014 LV wall motion demonstrated a "moderate degree of hypokinesis involving the mid-distal inferior wall and apex with an EF of 40%".  Prior echo dated 03/30/2010 showed EF 45-50%.   Because of continued worsening sob and before he was aware of stress test rests he  Presented to [facility] ED with increasing fatigue and chest tightness and sob. ECG demonstrated sinus tachycardia with no acute ST-T abnormalities. POC troponin normal, serum troponins pending.  Also noted to be anemic, Hgb 9 (10 on 12/06/13).   Wife notes the patients functional has significantly decreased since Feb of this year  Previous hx of left PTX treated with chest tube 2011    Current Activity/ Functional Status: Patient is independent with mobility/ambulation, transfers, ADL's, IADL's.   Zubrod Score: At the time of surgery this patient's most appropriate activity status/level should be described as: []     0    Normal activity, no symptoms []     1    Restricted in physical strenuous activity but ambulatory, able to do out light  work [x]     2    Ambulatory and capable of self care, unable to do work activities, up and about                 more than 50%  Of the time                            []     3    Only limited self care, in bed greater than 50% of waking hours []     4    Completely disabled, no self care, confined to bed or chair []     5    Moribund  Past Medical History  Diagnosis Date  . Allergy   . GERD (gastroesophageal reflux disease)   . Arthritis   . Asthma   . Fungal infection     lungs  . Cataract   . Ulcer   . BPH (benign prostatic hyperplasia)     sees Dr. Risa Grill, biopsy June 2015 was benign   . Wegener's granulomatosis 2011    sees Dr. [name]   . Peptic stricture of esophagus   . Anemia   . Hiatal hernia   . Adenomatous colon polyp 08/2011  . Bronchiectasis   . On home oxygen therapy 12-18-12    uses 2 l/m nasally at bedtime  . HOH (hard of hearing) 12-18-12  bilaterally  . HBP (high blood pressure) 09/28/2013    Past Surgical History  Procedure Laterality Date  . Hernia repair    . Eye surgery      cataracts removed.   . Colonoscopy  08-16-11    per Dr. Fuller Plan, diverticulosis and polyps, repeat in 5 yrs   . Esophagogastroduodenoscopy (egd) with esophageal dilation  11-29-10    per Dr. Fuller Plan   . Cataract extraction, bilateral  12-18-12    bilateral  . Cholecystectomy N/A 12/21/2012    Procedure: LAPAROSCOPIC CHOLECYSTECTOMY WITH INTRAOPERATIVE CHOLANGIOGRAM;  Surgeon:  Jolly, MD;  Location: [facility];  Service: General;  Laterality: N/A;    History  Smoking status  . Never Smoker   Smokeless tobacco  . Never Used    History  Alcohol Use No      Allergies  Allergen Reactions  . Factive [Gemifloxacin]     Ineffective     Current Facility-Administered Medications  Medication Dose Route Frequency Provider Last Rate Last Dose  . 0.9 %  sodium chloride infusion  250 mL Intravenous PRN Rhonda G Barrett, PA-C      . 0.9 %  sodium chloride infusion   Intravenous  Continuous Evelene Croon Barrett, PA-C 40 mL/hr at 03/13/14 2047    . 0.9 %  sodium chloride infusion  250 mL Intravenous PRN Leonie Man, MD      . acetaminophen (TYLENOL) tablet 650 mg  650 mg Oral Q4H PRN Evelene Croon Barrett, PA-C      . ALPRAZolam (XANAX) tablet 0.25 mg  0.25 mg Oral BID PRN Evelene Croon Barrett, PA-C      . aspirin EC tablet 81 mg  81 mg Oral QPM Minus Breeding, MD      . B-complex with vitamin C tablet 1 tablet  1 tablet Oral Daily Rhonda G Barrett, PA-C      . bisacodyl (DULCOLAX) EC tablet 5 mg  5 mg Oral Daily PRN Evelene Croon Barrett, PA-C      . bisoprolol (ZEBETA) tablet 2.5 mg  2.5 mg Oral Daily Leonie Man, MD   2.5 mg at 03/14/14 1500  . budesonide-formoterol (SYMBICORT) 160-4.5 MCG/ACT inhaler 2 puff  2 puff Inhalation BID Evelene Croon Barrett, PA-C   2 puff at 03/15/14 0026  . Cyclophosphamide CAPS 50 mg  50 mg Oral Q1400 Minus Breeding, MD   50 mg at 03/14/14 1943  . dextromethorphan (DELSYM) 30 MG/5ML liquid 30 mg  30 mg Oral BID PRN Rhonda G Barrett, PA-C      . dextromethorphan-guaiFENesin (MUCINEX DM) 30-600 MG per 12 hr tablet 1 tablet  1 tablet Oral BID Evelene Croon Barrett, PA-C   1 tablet at 03/14/14 2246  . dutasteride (AVODART) capsule 0.5 mg  0.5 mg Oral QODAY Rhonda G Barrett, PA-C   0.5 mg at 03/14/14 2246  . heparin ADULT infusion 100 units/mL (25000 units/250 mL)  1,100 Units/hr Intravenous Continuous Brain Hilts, RPH 11 mL/hr at 03/15/14 0002 1,100 Units/hr at 03/15/14 0002  . levalbuterol (XOPENEX) nebulizer solution 0.63 mg  0.63 mg Inhalation Q4H PRN Evelene Croon Barrett, PA-C   0.63 mg at 03/14/14 1612  . loratadine (CLARITIN) tablet 10 mg  10 mg Oral Daily Rhonda G Barrett, PA-C   10 mg at 03/14/14 1000  . nitroGLYCERIN (NITROGLYN) 2 % ointment 1 inch  1 inch Topical 4 times per day Rogelia Mire, NP   1 inch at 03/15/14 0610  . nitroGLYCERIN (NITROSTAT) SL tablet  0.4 mg  0.4 mg Sublingual Q5 Min x 3 PRN Rhonda G Barrett, PA-C      . ondansetron  (ZOFRAN) injection 4 mg  4 mg Intravenous Q6H PRN Rhonda G Barrett, PA-C      . pantoprazole (PROTONIX) EC tablet 40 mg  40 mg Oral Daily Rhonda G Barrett, PA-C   40 mg at 03/14/14 1745  . predniSONE (DELTASONE) tablet 10 mg  10 mg Oral Q breakfast Evelene Croon Barrett, PA-C   10 mg at 03/14/14 1000  . psyllium (HYDROCIL/METAMUCIL) packet 1 packet  1 packet Oral Daily Polly Cobia, RPH      . simethicone (MYLICON) chewable tablet 160 mg  160 mg Oral BID Evelene Croon Barrett, PA-C   160 mg at 03/14/14 1748  . simvastatin (ZOCOR) tablet 20 mg  20 mg Oral q1800 Rhonda G Barrett, PA-C   20 mg at 03/14/14 1749  . sodium chloride 0.9 % injection 3 mL  3 mL Intravenous Q12H Rhonda G Barrett, PA-C   3 mL at 03/14/14 1000  . sodium chloride 0.9 % injection 3 mL  3 mL Intravenous Q12H Leonie Man, MD      . sodium chloride 0.9 % injection 3 mL  3 mL Intravenous PRN Leonie Man, MD      . sulfamethoxazole-trimethoprim (BACTRIM DS) 800-160 MG per tablet 1 tablet  1 tablet Oral Daily Evelene Croon Barrett, PA-C   1 tablet at 03/14/14 1751  . tamsulosin (FLOMAX) capsule 0.4 mg  0.4 mg Oral QPM Rhonda G Barrett, PA-C   0.4 mg at 03/14/14 1748  . traMADol (ULTRAM) tablet 50 mg  50 mg Oral Q6H PRN Rhonda G Barrett, PA-C      . zolpidem (AMBIEN) tablet 5 mg  5 mg Oral QHS PRN Lonn Georgia, PA-C        Prescriptions prior to admission  Medication Sig Dispense Refill  . acetaminophen (TYLENOL) 325 MG tablet Take 650 mg by mouth every 6 (six) hours as needed for mild pain.      Marland Kitchen aspirin 81 MG tablet Take 81 mg by mouth every evening.       Marland Kitchen b complex vitamins tablet Take 1 tablet by mouth daily.        Marland Kitchen BIOTIN PO Take 1 tablet by mouth 3 (three) times daily with meals.      . bisacodyl (DULCOLAX) 5 MG EC tablet Take 5 mg by mouth daily as needed. CONSTIPATION      . budesonide-formoterol (SYMBICORT) 160-4.5 MCG/ACT inhaler Inhale 2 puffs into the lungs 2 (two) times daily.      . cyclophosphamide (CYTOXAN) 50  MG tablet Take 50 mg by mouth daily. Give on an empty stomach once daily at 1600. Take  1 hour before or 2 hours after meals.      Marland Kitchen dextromethorphan (DELSYM) 30 MG/5ML liquid Take 30 mg by mouth 2 (two) times daily as needed for cough.      . dextromethorphan-guaiFENesin (MUCINEX DM) 30-600 MG per 12 hr tablet Take 1 tablet by mouth 2 (two) times daily.       Marland Kitchen dutasteride (AVODART) 0.5 MG capsule Take 0.5 mg by mouth every other day. In the evening.      Marland Kitchen esomeprazole (NEXIUM) 20 MG capsule Take 20 mg by mouth 2 (two) times daily before a meal.       . levalbuterol (XOPENEX HFA) 45 MCG/ACT inhaler Inhale 1-2 puffs into the lungs every 4 (four)  hours as needed for wheezing.  1 Inhaler  12  . loratadine (CLARITIN) 10 MG tablet Take 10 mg by mouth daily.       . methylcellulose (CITRUCEL) oral powder Take 1 packet by mouth daily.      . predniSONE (DELTASONE) 10 MG tablet Take 10 mg by mouth daily with breakfast.      . Simethicone (GAS-X PO) Take 2 tablets by mouth 2 (two) times daily.       Marland Kitchen sulfamethoxazole-trimethoprim (BACTRIM DS) 800-160 MG per tablet Take 1 tablet by mouth daily. Take with Cytoxan.      . tamsulosin (FLOMAX) 0.4 MG CAPS capsule Take 1 capsule (0.4 mg total) by mouth every evening.  30 capsule  11  . traMADol (ULTRAM) 50 MG tablet 1-2 every 4 hours as needed for cough or pain  40 tablet  0    Family History  Problem Relation Age of Onset  . Asthma Father   . Coronary artery disease Brother   . Coronary artery disease Brother   . Coronary artery disease Mother      Review of Systems:     Cardiac Review of Systems: Y or N  Chest Pain [  y  ]  Resting SOB [ constant  ] Exertional SOB  [ y ]  Orthopnea [ n ]   Pedal Edema [n   ]    Palpitations [ n ] Syncope  [n  ]   Presyncope [  n ]  General Review of Systems: [Y] = yes [  ]=no Constitional: recent weight change Blue.Reese  ]; anorexia y  ]; fatigue [  y]; nausea [  ]; night sweats [  ]; fever [ n ]; or chills [ n ]                                                                Dental: poor dentition[ n ]; Last Dentist visit:   Eye : blurred vision [  ]; diplopia [   ]; vision changes [  ];  Amaurosis fugax[  ]; Resp: cough [ y ];  wheezing[y ];  hemoptysis[ n]; shortness of breath[y  ]; paroxysmal nocturnal dyspnea[ y ]; dyspnea on exertion[y  ]; or orthopnea[ n ];  GI:  gallstones[  ], vomiting[  ];  dysphagia[  ]; melena[  ];  hematochezia [  ]; heartburn[  ];   Hx of  Colonoscopy[  ]; GU: kidney stones [  ]; hematuria[  ];   dysuria [  ];  nocturia[  ];  history of     obstruction [  ]; urinary frequency [ y ]             Skin: rash, swelling[  ];, hair loss[  ];  peripheral edema[  ];  or itching[  ]; Musculosketetal: myalgias[  ];  joint swelling[  ];  joint erythema[  ];  joint pain[  ];  back pain[  ];  Heme/Lymph: bruising[ y ];  bleeding[ n ];  anemia[ y ];  Neuro: TIA[  ];  headaches[  ];  stroke[ n ];  vertigo[  ];  seizures[ n ];   paresthesias[  ];  difficulty walking[ y ];  Psych:depression[  ]; anxiety[  ];  Endocrine: diabetes[  ];  thyroid dysfunction[  ];  Immunizations: Flu Blue.Reese  ]; Pneumococcal[ y ];  Other:  Physical Exam: BP 114/65  Pulse 107  Temp(Src) 97.6 F (36.4 C) (Oral)  Resp 18  Ht 5\' 7"  (1.702 m)  Wt 119 lb 0.8 oz (54 kg)  BMI 18.64 kg/m2  SpO2 100%  General appearance: alert, appears older than stated age, cachectic, fatigued and moderate distress Neurologic: intact Heart: regular  rhythm, S1, S2 normal, no murmur, click, rub or gallop, tachycardia Lungs: rales bilaterally, rhonchi bilaterally and wheezes bilaterally Abdomen: soft, non-tender; bowel sounds normal; no masses,  no organomegaly Extremities: extremities normal, atraumatic, no cyanosis or edema and Homans sign is negative, no sign of DVT Wound: cath site rt radial intact No bruits.  patient with pursed lip breathing during exam SOB at rest, with sinus tachycardic 129   Diagnostic Studies & Laboratory  data:     Recent Radiology Findings:   Dg Chest 2 View  03/13/2014   CLINICAL DATA:  Shortness of breath, cough, chest congestion, and chest pain. Wegener's granulomatosis.  EXAM: CHEST  2 VIEW  COMPARISON:  02/12/2014, 12/06/2013, 11/07/2013, 10/07/2013, and CT scan dated 10/04/2013  FINDINGS: The lungs are hyperinflated consistent with emphysema. There are multiple patchy areas of ill-defined infiltrate/ scarring as well as cavitary lesions in both lungs. The appearance is essentially unchanged. Heart size and pulmonary vascularity are normal. Large hiatal hernia, stable. No acute osseous abnormality.  IMPRESSION: Severe chronic lung disease with emphysema and multiple areas of scarring, cavitation, and patchy infiltrate consistent with Wegener's granulomatosis.  No significant change since 02/12/2014.   Electronically Signed   By: Rozetta Nunnery M.D.   On: 03/13/2014 11:38      Recent Lab Findings: Lab Results  Component Value Date   WBC 7.1 03/14/2014   HGB 8.7* 03/14/2014   HCT 26.4* 03/14/2014   PLT 252 03/14/2014   GLUCOSE 91 03/14/2014   CHOL 112 03/14/2014   TRIG 61 03/14/2014   HDL 42 03/14/2014   LDLCALC 58 03/14/2014   ALT 7 03/14/2014   AST 11 03/14/2014   NA 143 03/14/2014   K 4.5 03/14/2014   CL 107 03/14/2014   CREATININE 1.81* 03/14/2014   BUN 25* 03/14/2014   CO2 23 03/14/2014   TSH 3.841 04/01/2010   INR 1.15 03/13/2014   HGBA1C 5.3 03/13/2014   Cath: FINDINGS:  Hemodynamics:  Central Aortic Pressure / Mean: 83/53/65 mmHg  Left Ventricular Pressure / LVEDP: 82/1/2 mmHg Left Ventriculography: Deferred to conserve contrast  Coronary Anatomy:  Dominance: Right Left Main: Large caliber mildly calcified vessel that trifurcates into the LAD, Ramus Intermedius, and Circumflex. No significant lesions. LAD: Normal caliber vessel with a very proximal small diagonal brainstem is major septal perforator. In its proximal segment there is a focal eccentric roughly 90% calcified appearing stenosis. The vessel  then normalizes somewhat until just after a mid small diagonal branch. At this point the vessel appeared to be subtotally occluded with almost collateral appearing distal TIMI 1 flow.  There are several proximal septal perforators it provides collaterals to the right posterior descending artery (RPDA).  D1: Mid vessel branch is diffusely diseased but nothing significant. Left Circumflex: Moderate caliber, nondominant vessel that gives rise to a very small AV groove branch that perfuses collaterals to the right posterior lateral system before terminating as a lateral OM it bifurcates into OM1 and 2. In the mid vessel just prior to the bifurcation the vessel is 100%  subtotally occluded with TIMI 1 antegrade versus retrograde flow from collaterals perfusing both branches of the OM. There is a short segment of occlusion. The distal branches are small to moderate caliber with the superior branch being significant and providing collaterals to the right posterior lateral system  Ramus intermedius: Large caliber vessel with what appears to be a proximal 60-80% stenosis is somewhat focal. The remainder the vessel is perhaps the best angiographic vessel noted. It courses down to the apex, with minimal disease beyond the proximal lesion  RCA: Large caliber, dominant vessel with diffuse disease in the mid vessel prior to the takeoff of a major RV marginal branch that actually courses across as a posterior lateral branch that has competitive flow from left to right collaterals. The mid RCA has sequential 80-95% stenoses covering roughly 20-25 mm region. Beyond the most distal portion of the lesions, the vessel normalizes prior to the takeoff of the RV marginal branch and then bifurcates distally into the Right Posterior AV Groove Branch (RPAV) and Right Posterior Descending Artery. Beyond the mid RCA severe disease, there minimal disease distally. MEDICATIONS:  Anesthesia: Local Lidocaine 2 ml Sedation: 1 mg IV Versed,  25 mcg IV fentanyl ;  Omnipaque Contrast: 55 ml  Anticoagulation: IV Heparin 3000 Units PATIENT DISPOSITION:  The patient was transferred to the PACU holding area in a hemodynamicaly stable, chest pain free condition.  The patient tolerated the procedure well, and there were no complications. EBL: < 5 ml  The patient was stable before, during, and after the procedure. POST-OPERATIVE DIAGNOSIS:  Severe multivessel CAD involving all 4 major branches: 90% proximal LAD with 100% mid subtotal occlusion with minimal distal flow after the distal diagonal branch, 80-90% proximal ramus intermedius, 100% mid OM subtotal occlusion prior to bifurcation, diffuse mid RCA disease with sequential 95% stenoses as well as 50-60% stenosis prior to the major marginal branch  Known cardiomyopathy with EF of 40% by Myoview stress test PLAN OF CARE:  Return to nursing unit for standard radial cath care  Restart heparin 6 hours after TR band removal  Start low-dose beta blocker for rate control and CAD benefit  Will review films with Interventional Colleagues in order to determine the best course of action. Theoretically CABG would be considered for multivessel disease, however in the absence of the distal LAD for lead insertion, the benefit is less appreciable.  Will consult CT surgery for their opinion, but would also consider the possibility of staged multivessel PCI with possible mechanical support.  Would keep patient in-patient pending decision to be made based on the severity of his symptoms. Leonie Man, M.D., M.S.  PFT's 11/2012  FEV1 1.52 62% DLCO 15.4 89% 8.7.2015 FEV1 1.1 44%   DLCO 13 45%  Assessment / Plan:   Progressive pulmonary deterioration since FEB this year  with increased sob, fatigue and wt loss likely related to [facility]- currently very symptomatic immunocompromised  Chronic Kidney Disease    Stage IV   GFR 15-29   Lab Results  Component Value Date   CREATININE 1.81* 03/14/2014    Estimated Creatinine Clearance: 26.9 ml/min (by C-G formula based on Cr of 1.81).  CAD 3 vessel with ischemic cardiomyopathy EF 40%  Discussed with the patient and his wife risks and options of CABG. Would not recommend cabg in the current setting, his underlying medical condition makes unlikely CABG will result in improvement in his current status.    Grace Isaac MD      [address] [place] [address] Office [phone]   Beeper [phone]  03/15/2014 7:45 AM

## 2014-03-16 DIAGNOSIS — I209 Angina pectoris, unspecified: Secondary | ICD-10-CM

## 2014-03-16 LAB — CBC
HEMATOCRIT: 23.6 % — AB (ref 39.0–52.0)
HEMOGLOBIN: 7.6 g/dL — AB (ref 13.0–17.0)
MCH: 33.2 pg (ref 26.0–34.0)
MCHC: 32.2 g/dL (ref 30.0–36.0)
MCV: 103.1 fL — AB (ref 78.0–100.0)
Platelets: 250 10*3/uL (ref 150–400)
RBC: 2.29 MIL/uL — ABNORMAL LOW (ref 4.22–5.81)
RDW: 15.6 % — AB (ref 11.5–15.5)
WBC: 6 10*3/uL (ref 4.0–10.5)

## 2014-03-16 LAB — HEPARIN LEVEL (UNFRACTIONATED)
HEPARIN UNFRACTIONATED: 0.25 [IU]/mL — AB (ref 0.30–0.70)
HEPARIN UNFRACTIONATED: 0.79 [IU]/mL — AB (ref 0.30–0.70)

## 2014-03-16 LAB — PREPARE RBC (CROSSMATCH)

## 2014-03-16 MED ORDER — SODIUM CHLORIDE 0.9 % IJ SOLN: 3.0000 mL | INTRAMUSCULAR | Status: DC | PRN

## 2014-03-16 MED ORDER — SODIUM CHLORIDE 0.9 % IV SOLN: 250.0000 mL | INTRAVENOUS | Status: DC | PRN

## 2014-03-16 MED ORDER — SODIUM CHLORIDE 0.9 % IV SOLN
INTRAVENOUS | Status: DC
  Administered 2014-03-16 – 2014-03-17 (×2): via INTRAVENOUS

## 2014-03-16 MED ORDER — SODIUM CHLORIDE 0.9 % IV SOLN: Freq: Once | INTRAVENOUS | Status: AC

## 2014-03-16 MED ORDER — ASPIRIN 81 MG PO CHEW
81.0000 mg | CHEWABLE_TABLET | ORAL | Status: AC
  Administered 2014-03-17: 81 mg via ORAL
  Filled 2014-03-16: qty 1

## 2014-03-16 MED ORDER — BISOPROLOL FUMARATE 5 MG PO TABS
2.5000 mg | ORAL_TABLET | Freq: Every day | ORAL | Status: DC
  Administered 2014-03-17: 2.5 mg via ORAL
  Administered 2014-03-18: 09:00:00 via ORAL
  Administered 2014-03-19 – 2014-03-26 (×7): 2.5 mg via ORAL
  Filled 2014-03-16 (×10): qty 0.5

## 2014-03-16 MED ORDER — FUROSEMIDE 10 MG/ML IJ SOLN
20.0000 mg | Freq: Once | INTRAMUSCULAR | Status: AC
  Administered 2014-03-16: 20 mg via INTRAVENOUS
  Filled 2014-03-16: qty 2

## 2014-03-16 MED ORDER — ACETAMINOPHEN 325 MG PO TABS
650.0000 mg | ORAL_TABLET | Freq: Once | ORAL | Status: AC
  Administered 2014-03-16: 650 mg via ORAL

## 2014-03-16 MED ORDER — SODIUM CHLORIDE 0.9 % IJ SOLN
3.0000 mL | Freq: Two times a day (BID) | INTRAMUSCULAR | Status: DC
  Administered 2014-03-17: 3 mL via INTRAVENOUS

## 2014-03-16 NOTE — Progress Notes (Signed)
ANTICOAGULATION CONSULT NOTE - Follow Up Consult  Pharmacy Consult for Heparin  Indication: chest pain/ACS  Allergies  Allergen Reactions  . Factive [Gemifloxacin]     Ineffective     Patient Measurements: Height: 5\' 7"  (170.2 cm) Weight: 120 lb 14.4 oz (54.84 kg) IBW/kg (Calculated) : 66.1  Vital Signs: Temp: 98.6 F (37 C) (08/09 0443) Temp src: Oral (08/09 0443) BP: 116/71 mmHg (08/09 0443) Pulse Rate: 96 (08/09 0443)  Labs:  Recent Labs  03/13/14 1159 03/13/14 1351 03/13/14 1851  03/13/14 2351 03/14/14 0555 03/15/14 0759 03/15/14 1725 03/16/14 0330  HGB 9.0*  --   --   --   --  8.7* 7.9*  --  7.6*  HCT 27.4*  --   --   --   --  26.4* 24.0*  --  23.6*  PLT 258  --   --   --   --  252 238  --  250  APTT  --  36  --   --   --   --   --   --   --   LABPROT  --  14.7  --   --   --   --   --   --   --   INR  --  1.15  --   --   --   --   --   --   --   HEPARINUNFRC  --   --   --   < > 0.25*  --  0.16* 0.33 0.25*  CREATININE 1.85*  --   --   --   --  1.81*  --   --   --   TROPONINI  --   --  <0.30  --  <0.30 <0.30  --   --   --   < > = values in this interval not displayed.  Estimated Creatinine Clearance: 27.3 ml/min (by C-G formula based on Cr of 1.81).   Medications:  Heparin 1250 units/hr  Assessment: 78 y/o M with sub-therapeutic HL of 0.25. Other labs as above.   Goal of Therapy:  Heparin level 0.3-0.7 units/ml Monitor platelets by anticoagulation protocol: Yes   Plan:  -Increase heparin to 1400 units/hr -1400 HL -Daily CBC/HL -Monitor for bleeding  Narda Bonds 03/16/2014,5:30 AM

## 2014-03-16 NOTE — Progress Notes (Addendum)
DAILY PROGRESS NOTE  Subjective:  No events overnight. Plan for PCI tomorrow.  Labs indicate Hgb is drifting down .Marland Kitchen He is on heparin. No bowel movement. He has not had hemoptysis. Iron stores are low.  Mild angina last night, better today.  Objective:  Temp:  [97.9 F (36.6 C)-98.6 F (37 C)] 98.6 F (37 C) (08/09 0443) Pulse Rate:  [86-101] 96 (08/09 0443) Resp:  [18] 18 (08/09 0443) BP: (100-116)/(64-71) 116/71 mmHg (08/09 0443) SpO2:  [97 %-100 %] 100 % (08/09 0443) Weight:  [120 lb 14.4 oz (54.84 kg)] 120 lb 14.4 oz (54.84 kg) (08/09 0443) Weight change: 1 lb 13.6 oz (0.84 kg)  Intake/Output from previous day: 08/08 0701 - 08/09 0700 In: 270 [P.O.:270] Out: 1075 [Urine:1075]  Intake/Output from this shift:    Medications: Current Facility-Administered Medications  Medication Dose Route Frequency Provider Last Rate Last Dose  . 0.9 %  sodium chloride infusion  250 mL Intravenous PRN Rhonda G Barrett, PA-C      . 0.9 %  sodium chloride infusion   Intravenous Continuous Evelene Croon Barrett, PA-C 40 mL/hr at 03/15/14 2046    . 0.9 %  sodium chloride infusion  250 mL Intravenous PRN Leonie Man, MD      . acetaminophen (TYLENOL) tablet 650 mg  650 mg Oral Q4H PRN Evelene Croon Barrett, PA-C      . ALPRAZolam (XANAX) tablet 0.25 mg  0.25 mg Oral BID PRN Evelene Croon Barrett, PA-C      . aspirin EC tablet 81 mg  81 mg Oral QPM Minus Breeding, MD   81 mg at 03/15/14 1735  . B-complex with vitamin C tablet 1 tablet  1 tablet Oral Daily Evelene Croon Barrett, PA-C   1 tablet at 03/15/14 0936  . bisacodyl (DULCOLAX) EC tablet 5 mg  5 mg Oral Daily PRN Evelene Croon Barrett, PA-C   5 mg at 03/15/14 1528  . bisoprolol (ZEBETA) tablet 2.5 mg  2.5 mg Oral Daily Leonie Man, MD   2.5 mg at 03/15/14 0936  . budesonide-formoterol (SYMBICORT) 160-4.5 MCG/ACT inhaler 2 puff  2 puff Inhalation BID Evelene Croon Barrett, PA-C   2 puff at 03/15/14 2032  . Cyclophosphamide CAPS 50 mg  50 mg Oral Q1400 Minus Breeding, MD   50 mg at 03/15/14 1353  . dextromethorphan (DELSYM) 30 MG/5ML liquid 30 mg  30 mg Oral BID PRN Rhonda G Barrett, PA-C      . dextromethorphan-guaiFENesin (MUCINEX DM) 30-600 MG per 12 hr tablet 1 tablet  1 tablet Oral BID Evelene Croon Barrett, PA-C   1 tablet at 03/15/14 2047  . dutasteride (AVODART) capsule 0.5 mg  0.5 mg Oral QODAY Rhonda G Barrett, PA-C   0.5 mg at 03/14/14 2246  . heparin ADULT infusion 100 units/mL (25000 units/250 mL)  1,400 Units/hr Intravenous Continuous Narda Bonds, RPH 14 mL/hr at 03/16/14 0544 1,400 Units/hr at 03/16/14 0544  . levalbuterol (XOPENEX) nebulizer solution 0.63 mg  0.63 mg Inhalation Q4H PRN Evelene Croon Barrett, PA-C   0.63 mg at 03/14/14 1612  . loratadine (CLARITIN) tablet 10 mg  10 mg Oral Daily Rhonda G Barrett, PA-C   10 mg at 03/15/14 0935  . nitroGLYCERIN (NITROGLYN) 2 % ointment 1 inch  1 inch Topical 4 times per day Rogelia Mire, NP   1 inch at 03/16/14 0544  . nitroGLYCERIN (NITROSTAT) SL tablet 0.4 mg  0.4 mg Sublingual Q5 Min x 3 PRN Suanne Marker  G Barrett, PA-C      . ondansetron (ZOFRAN) injection 4 mg  4 mg Intravenous Q6H PRN Rhonda G Barrett, PA-C      . pantoprazole (PROTONIX) EC tablet 40 mg  40 mg Oral Daily Rhonda G Barrett, PA-C   40 mg at 03/15/14 0935  . predniSONE (DELTASONE) tablet 10 mg  10 mg Oral Q breakfast Evelene Croon Barrett, PA-C   10 mg at 03/15/14 0936  . psyllium (HYDROCIL/METAMUCIL) packet 1 packet  1 packet Oral Daily Polly Cobia, RPH   1 packet at 03/15/14 [phone]  . simethicone (MYLICON) chewable tablet 160 mg  160 mg Oral BID Evelene Croon Barrett, PA-C   160 mg at 03/15/14 1735  . simvastatin (ZOCOR) tablet 20 mg  20 mg Oral q1800 Rhonda G Barrett, PA-C   20 mg at 03/15/14 1735  . sodium chloride 0.9 % injection 3 mL  3 mL Intravenous Q12H Rhonda G Barrett, PA-C   3 mL at 03/14/14 1000  . sodium chloride 0.9 % injection 3 mL  3 mL Intravenous Q12H Leonie Man, MD      . sodium chloride 0.9 % injection 3 mL  3  mL Intravenous PRN Leonie Man, MD      . sulfamethoxazole-trimethoprim (BACTRIM DS) 800-160 MG per tablet 1 tablet  1 tablet Oral Daily Evelene Croon Barrett, PA-C   1 tablet at 03/15/14 1528  . tamsulosin (FLOMAX) capsule 0.4 mg  0.4 mg Oral QPM Rhonda G Barrett, PA-C   0.4 mg at 03/15/14 1932  . traMADol (ULTRAM) tablet 50 mg  50 mg Oral Q6H PRN Rhonda G Barrett, PA-C      . zolpidem (AMBIEN) tablet 5 mg  5 mg Oral QHS PRN Lonn Georgia, PA-C        Physical Exam: General appearance: alert and no distress Lungs: diminished breath sounds bilaterally and wheezes bilaterally Heart: regular rate and rhythm, S1, S2 normal, no murmur, click, rub or gallop Extremities: extremities normal, atraumatic, no cyanosis or edema Pulses: 2+ and symmetric Skin: pale, dry  Lab Results: Results for orders placed during the hospital encounter of 03/13/14 (from the past 48 hour(s))  VITAMIN B12     Status: None   Collection Time    03/15/14  3:43 AM      Result Value Ref Range   Vitamin B-12 386  211 - 911 pg/mL   Comment: Performed at [place]     Status: None   Collection Time    03/15/14  3:43 AM      Result Value Ref Range   Folate 13.1     Comment: (NOTE)     Reference Ranges            Deficient:       0.4 - 3.3 ng/mL            Indeterminate:   3.4 - 5.4 ng/mL            Normal:              > 5.4 ng/mL     Performed at [place] [facility]     Status: Abnormal   Collection Time    03/15/14  3:43 AM      Result Value Ref Range   Iron 12 (*) 42 - 135 ug/dL   TIBC 149 (*) 215 - 435 ug/dL   Saturation Ratios 8 (*) 20 - 55 %  UIBC 137  125 - 400 ug/dL   Comment: Performed at [place]     Status: Abnormal   Collection Time    03/15/14  3:43 AM      Result Value Ref Range   Ferritin 359 (*) 22 - 322 ng/mL   Comment: Performed at [place]     Status: Abnormal   Collection Time    03/15/14  3:43 AM        Result Value Ref Range   Retic Ct Pct 1.5  0.4 - 3.1 %   RBC. 2.30 (*) 4.22 - 5.81 MIL/uL   Retic Count, Manual 34.5  19.0 - 186.0 K/uL  CBC     Status: Abnormal   Collection Time    03/15/14  7:59 AM      Result Value Ref Range   WBC 6.5  4.0 - 10.5 K/uL   RBC 2.38 (*) 4.22 - 5.81 MIL/uL   Hemoglobin 7.9 (*) 13.0 - 17.0 g/dL   HCT 24.0 (*) 39.0 - 52.0 %   MCV 100.8 (*) 78.0 - 100.0 fL   [name] 33.2  26.0 - 34.0 pg   MCHC 32.9  30.0 - 36.0 g/dL   RDW 15.9 (*) 11.5 - 15.5 %   Platelets 238  150 - 400 K/uL  HEPARIN LEVEL (UNFRACTIONATED)     Status: Abnormal   Collection Time    03/15/14  7:59 AM      Result Value Ref Range   Heparin Unfractionated 0.16 (*) 0.30 - 0.70 IU/mL   Comment:            IF HEPARIN RESULTS ARE BELOW     EXPECTED VALUES, AND PATIENT     DOSAGE HAS BEEN CONFIRMED,     SUGGEST FOLLOW UP TESTING     OF ANTITHROMBIN III LEVELS.  HEPARIN LEVEL (UNFRACTIONATED)     Status: None   Collection Time    03/15/14  5:25 PM      Result Value Ref Range   Heparin Unfractionated 0.33  0.30 - 0.70 IU/mL   Comment:            IF HEPARIN RESULTS ARE BELOW     EXPECTED VALUES, AND PATIENT     DOSAGE HAS BEEN CONFIRMED,     SUGGEST FOLLOW UP TESTING     OF ANTITHROMBIN III LEVELS.  HEPARIN LEVEL (UNFRACTIONATED)     Status: Abnormal   Collection Time    03/16/14  3:30 AM      Result Value Ref Range   Heparin Unfractionated 0.25 (*) 0.30 - 0.70 IU/mL   Comment:            IF HEPARIN RESULTS ARE BELOW     EXPECTED VALUES, AND PATIENT     DOSAGE HAS BEEN CONFIRMED,     SUGGEST FOLLOW UP TESTING     OF ANTITHROMBIN III LEVELS.  CBC     Status: Abnormal   Collection Time    03/16/14  3:30 AM      Result Value Ref Range   WBC 6.0  4.0 - 10.5 K/uL   RBC 2.29 (*) 4.22 - 5.81 MIL/uL   Hemoglobin 7.6 (*) 13.0 - 17.0 g/dL   HCT 23.6 (*) 39.0 - 52.0 %   MCV 103.1 (*) 78.0 - 100.0 fL   [name] 33.2  26.0 - 34.0 pg   MCHC 32.2  30.0 - 36.0 g/dL   RDW 15.6 (*) 11.5 -  15.5 %   Platelets 250  150 - 400 K/uL    Imaging: No results found.  Assessment:  1. Principal Problem: 2.   Unstable Class III Angina 3. Active Problems: 4.   HYPOTHYROIDISM 5.   HYPERLIPIDEMIA 6.   ANEMIA 7.   Wegener's granulomatosis 8.   Abnormal nuclear stress test: Intermediate risk with moderate region of apical and inferior scar with mild superimposed ischemia; EF 40% 9.   Plan:  1. Multivessel CAD, not a CABG candidate due to severely decreased lung function. Anemic with progressively declining HCT on heparin. No clear source of bleeding. Low iron stores. Would recommend transfusion today - 2U PRBC's with lasix in between. Will then hydrate overnight for cath tomorrow. Plan for staged PCI. Bare metal stents are preferred to minimize due antiplatelet duration and reduce long-term bleeding risk from Wegener's. He is agreeable to this plan.  Pre-catheterization orders placed for tomorrow.  Time Spent Directly with Patient:  15 minutes  Length of Stay:  LOS: 3 days   Pixie Casino, MD, [facility] Attending Cardiologist [facility]  Osaze Hubbert C 03/16/2014, 7:19 AM

## 2014-03-16 NOTE — Progress Notes (Addendum)
ANTICOAGULATION CONSULT NOTE - Follow Up Consult  Pharmacy Consult for Heparin  Indication: chest pain/ACS  Allergies  Allergen Reactions  . Factive [Gemifloxacin]     Ineffective     Patient Measurements: Height: 5\' 7"  (170.2 cm) Weight: 120 lb 14.4 oz (54.84 kg) IBW/kg (Calculated) : 66.1  Vital Signs: Temp: 98.6 F (37 C) (08/09 1345) Temp src: Oral (08/09 1345) BP: 111/64 mmHg (08/09 1345) Pulse Rate: 90 (08/09 1345)  Labs:  Recent Labs  03/13/14 1851  03/13/14 2351  03/14/14 0555 03/15/14 0759 03/15/14 1725 03/16/14 0330 03/16/14 1457  HGB  --   --   --   < > 8.7* 7.9*  --  7.6*  --   HCT  --   --   --   --  26.4* 24.0*  --  23.6*  --   PLT  --   --   --   --  252 238  --  250  --   HEPARINUNFRC  --   < > 0.25*  --   --  0.16* 0.33 0.25* 0.79*  CREATININE  --   --   --   --  1.81*  --   --   --   --   TROPONINI <0.30  --  <0.30  --  <0.30  --   --   --   --   < > = values in this interval not displayed.  Estimated Creatinine Clearance: 27.3 ml/min (by C-G formula based on Cr of 1.81).   Medications:  Heparin 1250 units/hr  Assessment: 78 y/o M on a heparin infusion for ACS. HL this afternoon is supra-therapeutic after increasing the infusion to 1400 units/hr. Other labs as above. Per RN, no s/s of bleeding noted. Patient received two units of PRBCs today.   Goal of Therapy:  Heparin level 0.3-0.7 units/ml Monitor platelets by anticoagulation protocol: Yes   Plan:  -Decrease heparin infusion to 1350 units/hr -Daily CBC/HL -Monitor for bleeding  Anette Guarneri, PharmD [phone]

## 2014-03-17 ENCOUNTER — Encounter (HOSPITAL_COMMUNITY)
Admission: EM | Disposition: A | Discharge status: Home / Self Care | Payer: Self-pay | Source: Home / Self Care | Attending: Internal Medicine

## 2014-03-17 ENCOUNTER — Encounter (HOSPITAL_COMMUNITY)
Admission: EM | Disposition: A | Discharge status: Home / Self Care | Payer: Medicare Other | Source: Home / Self Care | Attending: Internal Medicine

## 2014-03-17 DIAGNOSIS — I2582 Chronic total occlusion of coronary artery: Secondary | ICD-10-CM

## 2014-03-17 DIAGNOSIS — I255 Ischemic cardiomyopathy: Secondary | ICD-10-CM | POA: Diagnosis present

## 2014-03-17 DIAGNOSIS — I2510 Atherosclerotic heart disease of native coronary artery without angina pectoris: Secondary | ICD-10-CM | POA: Diagnosis present

## 2014-03-17 DIAGNOSIS — Z9861 Coronary angioplasty status: Secondary | ICD-10-CM

## 2014-03-17 DIAGNOSIS — N183 Chronic kidney disease, stage 3 unspecified: Secondary | ICD-10-CM | POA: Diagnosis present

## 2014-03-17 DIAGNOSIS — D649 Anemia, unspecified: Secondary | ICD-10-CM | POA: Diagnosis present

## 2014-03-17 HISTORY — PX: PERCUTANEOUS CORONARY STENT INTERVENTION (PCI-S): SHX5485

## 2014-03-17 HISTORY — PX: CARDIAC CATHETERIZATION: SHX172

## 2014-03-17 LAB — BASIC METABOLIC PANEL
Anion gap: 11 (ref 5–15)
BUN: 21 mg/dL (ref 6–23)
CALCIUM: 7.9 mg/dL — AB (ref 8.4–10.5)
CO2: 22 mEq/L (ref 19–32)
CREATININE: 1.49 mg/dL — AB (ref 0.50–1.35)
Chloride: 111 mEq/L (ref 96–112)
GFR calc Af Amer: 51 mL/min — ABNORMAL LOW (ref >90)
GFR, EST NON AFRICAN AMERICAN: 44 mL/min — AB (ref >90)
GLUCOSE: 101 mg/dL — AB (ref 70–99)
Potassium: 4 mEq/L (ref 3.7–5.3)
SODIUM: 144 meq/L (ref 137–147)

## 2014-03-17 LAB — CBC
HCT: 29.4 % — ABNORMAL LOW (ref 39.0–52.0)
Hemoglobin: 9.7 g/dL — ABNORMAL LOW (ref 13.0–17.0)
MCH: 32 pg (ref 26.0–34.0)
MCHC: 33 g/dL (ref 30.0–36.0)
MCV: 97 fL (ref 78.0–100.0)
PLATELETS: 240 10*3/uL (ref 150–400)
RBC: 3.03 MIL/uL — ABNORMAL LOW (ref 4.22–5.81)
RDW: 19.8 % — AB (ref 11.5–15.5)
WBC: 7 10*3/uL (ref 4.0–10.5)

## 2014-03-17 LAB — POCT ACTIVATED CLOTTING TIME: Activated Clotting Time: 321 seconds

## 2014-03-17 LAB — HEPARIN LEVEL (UNFRACTIONATED): Heparin Unfractionated: 0.34 IU/mL (ref 0.30–0.70)

## 2014-03-17 SURGERY — PERCUTANEOUS CORONARY STENT INTERVENTION (PCI-S)
Anesthesia: LOCAL

## 2014-03-17 SURGERY — CORONARY ARTERY BYPASS GRAFTING (CABG)
Anesthesia: General | Site: Chest

## 2014-03-17 MED ORDER — LIDOCAINE HCL (PF) 1 % IJ SOLN
INTRAMUSCULAR | Status: AC
  Filled 2014-03-17: qty 30

## 2014-03-17 MED ORDER — CLOPIDOGREL BISULFATE 300 MG PO TABS
ORAL_TABLET | ORAL | Status: AC
  Filled 2014-03-17: qty 2

## 2014-03-17 MED ORDER — ONDANSETRON HCL 4 MG/2ML IJ SOLN: 4.0000 mg | Freq: Four times a day (QID) | INTRAMUSCULAR | Status: DC | PRN

## 2014-03-17 MED ORDER — FENTANYL CITRATE 0.05 MG/ML IJ SOLN
INTRAMUSCULAR | Status: AC
  Filled 2014-03-17: qty 2

## 2014-03-17 MED ORDER — ACETAMINOPHEN 325 MG PO TABS
650.0000 mg | ORAL_TABLET | ORAL | Status: DC | PRN
  Administered 2014-03-19 – 2014-03-21 (×3): 650 mg via ORAL
  Filled 2014-03-17 (×3): qty 2

## 2014-03-17 MED ORDER — NITROGLYCERIN 1 MG/10 ML FOR IR/CATH LAB
INTRA_ARTERIAL | Status: AC
  Filled 2014-03-17: qty 10

## 2014-03-17 MED ORDER — CLOPIDOGREL BISULFATE 300 MG PO TABS
ORAL_TABLET | ORAL | Status: AC
  Filled 2014-03-17: qty 1

## 2014-03-17 MED ORDER — CLOPIDOGREL BISULFATE 75 MG PO TABS
75.0000 mg | ORAL_TABLET | Freq: Every day | ORAL | Status: DC
  Administered 2014-03-18 – 2014-03-26 (×9): 75 mg via ORAL
  Filled 2014-03-17 (×11): qty 1

## 2014-03-17 MED ORDER — HEPARIN (PORCINE) IN NACL 100-0.45 UNIT/ML-% IJ SOLN
1350.0000 [IU]/h | INTRAMUSCULAR | Status: DC
  Administered 2014-03-18: 08:00:00 1350 [IU]/h via INTRAVENOUS
  Filled 2014-03-17: qty 250

## 2014-03-17 MED ORDER — HEPARIN (PORCINE) IN NACL 2-0.9 UNIT/ML-% IJ SOLN
INTRAMUSCULAR | Status: AC
  Filled 2014-03-17: qty 1000

## 2014-03-17 MED ORDER — MIDAZOLAM HCL 2 MG/2ML IJ SOLN
INTRAMUSCULAR | Status: AC
  Filled 2014-03-17: qty 2

## 2014-03-17 MED ORDER — BIVALIRUDIN 250 MG IV SOLR
INTRAVENOUS | Status: AC
  Filled 2014-03-17: qty 250

## 2014-03-17 MED ORDER — SODIUM CHLORIDE 0.9 % IV SOLN: INTRAVENOUS | Status: DC

## 2014-03-17 MED ORDER — ASPIRIN EC 81 MG PO TBEC
81.0000 mg | DELAYED_RELEASE_TABLET | Freq: Every day | ORAL | Status: DC
  Administered 2014-03-18: 09:00:00 81 mg via ORAL
  Filled 2014-03-17: qty 1

## 2014-03-17 NOTE — Progress Notes (Signed)
    Subjective:  No chest pain or dyspnea at rest  Objective:  Vital Signs in the last 24 hours: Temp:  [98.4 F (36.9 C)-99 F (37.2 C)] 98.9 F (37.2 C) (08/10 0515) Pulse Rate:  [82-98] 95 (08/10 0515) Resp:  [10-20] 20 (08/10 0515) BP: (92-143)/(54-68) 143/67 mmHg (08/10 0515) SpO2:  [99 %-100 %] 100 % (08/10 0515) Weight:  [120 lb 6.4 oz (54.613 kg)] 120 lb 6.4 oz (54.613 kg) (08/10 0515)  Intake/Output from previous day:  Intake/Output Summary (Last 24 hours) at 03/17/14 0825 Last data filed at 03/17/14 0600  Gross per 24 hour  Intake   1865 ml  Output   1850 ml  Net     15 ml    Physical Exam: General appearance: alert, cooperative, no distress and on O2 Lungs: decreased breath sounds, no rales Heart: regular rate and rhythm and 2/6 systolic murmur LSB   Rate: 88  Rhythm: normal sinus rhythm and premature atrial contractions (PAC)  Lab Results:  Recent Labs  03/16/14 0330 03/17/14 0508  WBC 6.0 7.0  HGB 7.6* 9.7*  PLT 250 240   No results found for this basename: NA, K, CL, CO2, GLUCOSE, BUN, CREATININE,  in the last 72 hours No results found for this basename: TROPONINI, CK, MB,  in the last 72 hours No results found for this basename: INR,  in the last 72 hours  Imaging: Imaging results have been reviewed  CHEST 2 VIEW  COMPARISON: 02/12/2014, 12/06/2013, 11/07/2013, 10/07/2013, and CT  scan dated 10/04/2013  FINDINGS:  The lungs are hyperinflated consistent with emphysema. There are  multiple patchy areas of ill-defined infiltrate/ scarring as well as  cavitary lesions in both lungs. The appearance is essentially  unchanged. Heart size and pulmonary vascularity are normal. Large  hiatal hernia, stable. No acute osseous abnormality.  IMPRESSION:  Severe chronic lung disease with emphysema and multiple areas of  scarring, cavitation, and patchy infiltrate consistent with  Wegener's granulomatosis.  No significant change since  02/12/2014   Cardiac Studies:  Assessment/Plan:   78 yr old male with a PMH significant for Wegener's granulomatosis followed by Dr [name]. The pt has had declining lung function over the past several months. He was referred to Dr. Debara Pickett who proceeded with nuclear stress testing which demonstrated a "moderate region of apical and inferior scar with mild superimposed qualitative ischemia".  EF was 40%" with multiple WMA.  The pt presented to [facility] ED 03/13/14 with increasing fatigue and chest tightness. He was admitted and ruled out for an MI. Cath done 03/14/14 revealed severe 4V CAD. The pt was seen by Dr Servando Snare and was felt not to be a candidate for CABG. He has been anemic as well, transfused 2 units yesterday.    Principal Problem:   Unstable Class III Angina Active Problems:   CAD- severe 4 V CAD- not CABG candidate   Wegener's granulomatosis   Chronic respiratory failure   Anemia- transfused 03/16/14   Chronic renal insufficiency, stage III (moderate)   HYPOTHYROIDISM   HYPERLIPIDEMIA   Abnormal nuclear stress test   ANEMIA   PLAN: The plan is for PCI with BMS and limited contrast today.    Kerin Ransom PA-C Beeper [phone] 03/17/2014, 8:25 AM

## 2014-03-17 NOTE — CV Procedure (Signed)
Richard [name] is a 78 y.o. male    [id number]  [id number] LOCATION:  FACILITY: [place]  PHYSICIAN: Troy Sine, MD, [facility] Jan 18, 1936   DATE OF PROCEDURE:  03/17/2014    PERCUTANEOUS CORONARY INTERVENTION    HISTORY:    Richard [name] is a 78 y.o. male with Wegener's granulomatosis, as well as severe multifocal bronchiectasis, who had undergone cardiac catheterization after an abnormal stress study, which demonstrated three-vessel disease.  If his underlying medical conditions, CABG surgery was not recommended. He is now referred for multi-vessel coronary intervention with plans today to perform PCI to his LAD and circumflex with ultimate stage PCI to his RCA.  The patient does have significant anemia and required recent blood cell transfusions.  Bare-metal stenting was advised.   PROCEDURE: Two-vessel percutaneous coronary intervention: Angiosculpt/bare-metal stenting of the LAD; PTCA of a chronic totally occluded circumflex marginal vessel.  The patient was brought to the [facility] cardiac catherization labaratory in the postabsorptive state. He was premedicated initially with Versed 1 mg and fentanyl 25 mcg, but received 2 additional doses during the procedure. His right groin was prepped and shaved in usual sterile fashion. Xylocaine 1% was used for local anesthesia. A 6 French sheath was inserted into the R femoral artery.  A 6 French XB LAD 3.5 guide was inserted.  Angiomax bolus plus infusion was administered and the patient received 600 mg of oral Plavix.  A pro-water wire was advanced down the LAD across the proximal stenosis and positioned proximal to the diffuse distal disease which was not to be intervened upon.  Due to significant calcification proximally and Angiosculpt 2.5x10 mm scoring balloon was utilized and 3 inflations up to 9 atmospheres was were performed. A Multi-link Vsion 3.0 .x18 mm bare metal stent was then deployed at 11 atmospheres. Post stent dilatation  was done with an Dietrich Euphora balloon 3.25x15 mm at 13 atmospheres corresponding to 3.23 mm size.  Scout angiography confirmed an excellent angiographic result.  Attention was then directed at the left circumflex vessel.  The hilar wire was then advanced down the circumflex vessel, but this was unable to cross the total occlusion of the marginal branch.  A Choice PT moderate support wire was then inserted and with balloons support with a 2.0x12 mm balloon, the total occlusion was able to be crossed and the wire was advanced into the marginal branch.  Multiple inflations at 3, 6, 7, 7, 7, and 8 atmospheres were made throughout the area of chronic occlusion.  This corresponded to a 2.1 mm size.  It was felt that the vessel size was too small to stent.  Angiography confirmed an excellent angiographic result with resumption of brisk antegrade TIMI-3 flow to the AV groove circumflex and marginal branch.  The arterial sheath was sutured in place with plans for sheath removal following completion of bivalirudin.  The patient left the catheterization laboratory, chest pain-free with stable hemodynamics.  The  HEMODYNAMICS:   Central Aorta: 119/66   ANGIOGRAPHY:  LAD: The LAD was calcified and there was focal stenosis of 95% proximally, just after the septal perforating artery.  The distal LAD was a diffusely diseased small caliber vessel after a distal diagonal vessel.  Following scoring balloon dilatation with the Angiosculpt catheter and bare-metal stenting with a 3.0x18 mm Vision stent, postdilated to 3.23 mm, the 95% stenosis was reduced to 0%.  There was brisk TIMI-3 flow.  There was no evidence for dissection.  Left circumflex: The circumflex marginal vessel was  totally occluded.  There was left to left collateralization of the distal marginal and AV groove branch and a gap was present between the proximal vessel and collateralized vessels.  Following PTCA, he 100% occlusion was reduced to approximately 5%.   There was no stenosis in the superior branch, but there was 50% ostial narrowing in the AV groove-like branch.   Total contrast used: Approximately 120 cc Omnipaque   IMPRESSION:  Successful two-vessel coronary intervention with Angiosculpt/bare-metal stenting of the proximal LAD with a 95% stenosis being reduced to 0% (3.0x18 mm Vision stent post dilated to 3.2, 3 mm) and PTCA of a chronically occluded circumflex marginal vessel.  RECOMMENDATION:  The patient will be aggressively hydrated.  Following this, two-vessel intervention.  The omental stenting was used due to the patient's significant recent anemia and his Wegener's granulomatosis.  Plans are for staged PCI to his RCA proximally 48 hours if renal function allows.   Troy Sine, MD, [facility] 03/17/2014 6:47 PM

## 2014-03-17 NOTE — Progress Notes (Addendum)
Pt. Seen and examined. Agree with the NP/PA-C note as written. Creatinine improved overnight from 1.8 to 1.45.  HCT responded appropriately to transfusion to 9/29. Ready for high-risk PCI today - Bare metal stents are preferred to minimize DAPT time.  I went over the risks (including a much higher bleeding risk than typical as well as higher risk of contrast nephropathy) as well as benefits with him and he wishes to proceed.  Pixie Casino, MD, [facility] Attending Cardiologist [place]

## 2014-03-17 NOTE — Progress Notes (Signed)
ANTICOAGULATION CONSULT NOTE - Follow Up Consult  Pharmacy Consult for heparin Indication: chest pain/ACS  Allergies  Allergen Reactions  . Factive [Gemifloxacin]     Ineffective     Patient Measurements: Height: 5\' 7"  (170.2 cm) Weight: 120 lb 6.4 oz (54.613 kg) IBW/kg (Calculated) : 66.1 Heparin Dosing Weight: 55kg  Vital Signs: Temp: 98.3 F (36.8 C) (08/10 1348) Temp src: Oral (08/10 1348) BP: 131/67 mmHg (08/10 1348) Pulse Rate: 72 (08/10 1930)  Labs:  Recent Labs  03/15/14 0759  03/16/14 0330 03/16/14 1457 03/17/14 0508 03/17/14 1410  HGB 7.9*  --  7.6*  --  9.7*  --   HCT 24.0*  --  23.6*  --  29.4*  --   PLT 238  --  250  --  240  --   HEPARINUNFRC 0.16*  < > 0.25* 0.79* 0.34  --   CREATININE  --   --   --   --   --  1.49*  < > = values in this interval not displayed.  Estimated Creatinine Clearance: 33.1 ml/min (by C-G formula based on Cr of 1.49).   Assessment: 65 YOM s/p cath and intervention today with Dr. Claiborne Billings, to have staged PCI Wednesday 8/12 after hydration. Was on Angiomax post-procedure which per RN Mickel Baas was empty at 1910 with plan to pull sheath at 2110. Spoke with Dr. Claiborne Billings and to resume heparin 8 hours post-sheath removal. Was last therapeutic on 1350 units/hr. No overt bleeding noted.  Goal of Therapy:  Heparin level 0.3-0.7 units/ml Monitor platelets by anticoagulation protocol: Yes   Plan:  1. At 0500, resume heparin at 1350 units/hr 2. Heparin level 8 hours after resumption 3. Daily HL and CBC 4. Cath planned for 8/12  Raegen Tarpley D. Elspeth Blucher, PharmD, BCPS Clinical Pharmacist Pager: [phone] 03/17/2014 8:11 PM

## 2014-03-17 NOTE — H&P (View-Only) (Signed)
    Subjective:  No chest pain or dyspnea at rest  Objective:  Vital Signs in the last 24 hours: Temp:  [98.4 F (36.9 C)-99 F (37.2 C)] 98.9 F (37.2 C) (08/10 0515) Pulse Rate:  [82-98] 95 (08/10 0515) Resp:  [10-20] 20 (08/10 0515) BP: (92-143)/(54-68) 143/67 mmHg (08/10 0515) SpO2:  [99 %-100 %] 100 % (08/10 0515) Weight:  [120 lb 6.4 oz (54.613 kg)] 120 lb 6.4 oz (54.613 kg) (08/10 0515)  Intake/Output from previous day:  Intake/Output Summary (Last 24 hours) at 03/17/14 0825 Last data filed at 03/17/14 0600  Gross per 24 hour  Intake   1865 ml  Output   1850 ml  Net     15 ml    Physical Exam: General appearance: alert, cooperative, no distress and on O2 Lungs: decreased breath sounds, no rales Heart: regular rate and rhythm and 2/6 systolic murmur LSB   Rate: 88  Rhythm: normal sinus rhythm and premature atrial contractions (PAC)  Lab Results:  Recent Labs  03/16/14 0330 03/17/14 0508  WBC 6.0 7.0  HGB 7.6* 9.7*  PLT 250 240   No results found for this basename: NA, K, CL, CO2, GLUCOSE, BUN, CREATININE,  in the last 72 hours No results found for this basename: TROPONINI, CK, MB,  in the last 72 hours No results found for this basename: INR,  in the last 72 hours  Imaging: Imaging results have been reviewed  CHEST 2 VIEW  COMPARISON: 02/12/2014, 12/06/2013, 11/07/2013, 10/07/2013, and CT  scan dated 10/04/2013  FINDINGS:  The lungs are hyperinflated consistent with emphysema. There are  multiple patchy areas of ill-defined infiltrate/ scarring as well as  cavitary lesions in both lungs. The appearance is essentially  unchanged. Heart size and pulmonary vascularity are normal. Large  hiatal hernia, stable. No acute osseous abnormality.  IMPRESSION:  Severe chronic lung disease with emphysema and multiple areas of  scarring, cavitation, and patchy infiltrate consistent with  Wegener's granulomatosis.  No significant change since  02/12/2014   Cardiac Studies:  Assessment/Plan:   78 yr old male with a PMH significant for Wegener's granulomatosis followed by Dr [name]. The pt has had declining lung function over the past several months. He was referred to Dr. Debara Pickett who proceeded with nuclear stress testing which demonstrated a "moderate region of apical and inferior scar with mild superimposed qualitative ischemia".  EF was 40%" with multiple WMA.  The pt presented to [facility] ED 03/13/14 with increasing fatigue and chest tightness. He was admitted and ruled out for an MI. Cath done 03/14/14 revealed severe 4V CAD. The pt was seen by Dr Servando Snare and was felt not to be a candidate for CABG. He has been anemic as well, transfused 2 units yesterday.    Principal Problem:   Unstable Class III Angina Active Problems:   CAD- severe 4 V CAD- not CABG candidate   Wegener's granulomatosis   Chronic respiratory failure   Anemia- transfused 03/16/14   Chronic renal insufficiency, stage III (moderate)   HYPOTHYROIDISM   HYPERLIPIDEMIA   Abnormal nuclear stress test   ANEMIA   PLAN: The plan is for PCI with BMS and limited contrast today.    Kerin Ransom PA-C Beeper [phone] 03/17/2014, 8:25 AM

## 2014-03-17 NOTE — Progress Notes (Signed)
Brief X-Cover Note  MD Claiborne Billings notified of swelling of Richard [name] right groin after sheath pull and 30 minutes of holding pressure. femstop applied per protocol. I stopped by to assess and Richard [name] doing well, fairly comfortable, right groin soft, mild swelling just distal to femstop with current pressure 8-10 mmHg. Good pulses distally. Vitals reviewed; BP 116/66  Pulse 68  Temp(Src) 98.4 F (36.9 C) (Oral)  Resp 16  Ht 5\' 7"  (1.702 m)  Wt 54.613 kg (120 lb 6.4 oz)  BMI 18.85 kg/m2  SpO2 99%. Continue to follow protocol, femstop to be gradually weaned. Hold heparin; defer any restart to team in AM given bleeding/hematoma.   Jules Husbands, MD

## 2014-03-17 NOTE — Progress Notes (Signed)
Site area: right groin  Site Prior to Removal:  Level 0  Pressure Applied For 60 MINUTES    Minutes Beginning at 21:20  Manual:   Yes.    Patient Status During Pull:  WNL  Post Pull Groin Site:  Level 2  Post Pull Instructions Given:  Yes.    Post Pull Pulses Present:  Yes.    Dressing Applied:  Yes.    Comments: Right groin hematoma Level"2" femstop applied at 22:25 as ordered then deflate as per protocol. Will continue to monitor pt

## 2014-03-17 NOTE — H&P (View-Only) (Signed)
Pt. Seen and examined. Agree with the NP/PA-C note as written. Creatinine improved overnight from 1.8 to 1.45.  HCT responded appropriately to transfusion to 9/29. Ready for high-risk PCI today - Bare metal stents are preferred to minimize DAPT time.  I went over the risks (including a much higher bleeding risk than typical as well as higher risk of contrast nephropathy) as well as benefits with him and he wishes to proceed.  Pixie Casino, MD, [facility] Attending Cardiologist [place]

## 2014-03-17 NOTE — Progress Notes (Signed)
ANTICOAGULATION CONSULT NOTE - Follow Up Consult  Pharmacy Consult for Heparin  Indication: chest pain/ACS  Allergies  Allergen Reactions  . Factive [Gemifloxacin]     Ineffective     Patient Measurements: Height: 5\' 7"  (170.2 cm) Weight: 120 lb 6.4 oz (54.613 kg) IBW/kg (Calculated) : 66.1  Vital Signs: Temp: 98.9 F (37.2 C) (08/10 0515) Temp src: Oral (08/10 0515) BP: 143/67 mmHg (08/10 0515) Pulse Rate: 95 (08/10 0515)  Labs:  Recent Labs  03/15/14 0759  03/16/14 0330 03/16/14 1457 03/17/14 0508  HGB 7.9*  --  7.6*  --  9.7*  HCT 24.0*  --  23.6*  --  29.4*  PLT 238  --  250  --  240  HEPARINUNFRC 0.16*  < > 0.25* 0.79* 0.34  < > = values in this interval not displayed.  Estimated Creatinine Clearance: 27.2 ml/min (by C-G formula based on Cr of 1.81).   Medications:  Heparin 1250 units/hr  Assessment: 78 y/o M on a heparin infusion for ACS. HL is therapeutic. Other labs as above.  Patient received two units of PRBCs 8/9.   Goal of Therapy:  Heparin level 0.3-0.7 units/ml Monitor platelets by anticoagulation protocol: Yes   Plan:  -Cont heparin infusion at 1350 units/hr -Daily CBC/HL  Excell Seltzer, PharmD [phone]

## 2014-03-17 NOTE — Interval H&P Note (Signed)
Cath Lab Visit (complete for each Cath Lab visit)  Clinical Evaluation Leading to the Procedure:   ACS: No.  Non-ACS:    Anginal Classification: CCS III  Anti-ischemic medical therapy: Maximal Therapy (2 or more classes of medications)  Non-Invasive Test Results: Intermediate-risk stress test findings: cardiac mortality 1-3%/year  Prior CABG: No previous CABG      History and Physical Interval Note:  03/17/2014 5:03 PM  Richard [name]  has presented today for surgery, with the diagnosis of cad  The various methods of treatment have been discussed with the patient and family. After consideration of risks, benefits and other options for treatment, the patient has consented to  Procedure(s): PERCUTANEOUS CORONARY STENT INTERVENTION (PCI-S) (N/A) as a surgical intervention .  The patient's history has been reviewed, patient examined, no change in status, stable for surgery.  I have reviewed the patient's chart and labs.  Questions were answered to the patient's satisfaction.     KELLY,THOMAS A

## 2014-03-17 NOTE — Progress Notes (Signed)
Angiomax infusing on admission to unit and discontinued when bag empty, 1910, as instructed in report. Pharmacist notified and reported to pharmacist that sheath pull is scheduled to be done at 2110, 2 hours after angiomax dcd as ordered.

## 2014-03-18 ENCOUNTER — Encounter (HOSPITAL_COMMUNITY): Payer: Self-pay

## 2014-03-18 DIAGNOSIS — J471 Bronchiectasis with (acute) exacerbation: Secondary | ICD-10-CM

## 2014-03-18 DIAGNOSIS — Z9861 Coronary angioplasty status: Secondary | ICD-10-CM

## 2014-03-18 DIAGNOSIS — I2589 Other forms of chronic ischemic heart disease: Secondary | ICD-10-CM

## 2014-03-18 LAB — BASIC METABOLIC PANEL
ANION GAP: 12 (ref 5–15)
BUN: 23 mg/dL (ref 6–23)
CALCIUM: 7.7 mg/dL — AB (ref 8.4–10.5)
CO2: 20 mEq/L (ref 19–32)
Chloride: 109 mEq/L (ref 96–112)
Creatinine, Ser: 1.38 mg/dL — ABNORMAL HIGH (ref 0.50–1.35)
GFR calc Af Amer: 56 mL/min — ABNORMAL LOW (ref >90)
GFR, EST NON AFRICAN AMERICAN: 48 mL/min — AB (ref >90)
Glucose, Bld: 127 mg/dL — ABNORMAL HIGH (ref 70–99)
Potassium: 3.6 mEq/L — ABNORMAL LOW (ref 3.7–5.3)
Sodium: 141 mEq/L (ref 137–147)

## 2014-03-18 LAB — CBC
HCT: 26.5 % — ABNORMAL LOW (ref 39.0–52.0)
Hemoglobin: 8.7 g/dL — ABNORMAL LOW (ref 13.0–17.0)
MCH: 31.2 pg (ref 26.0–34.0)
MCHC: 32.8 g/dL (ref 30.0–36.0)
MCV: 95 fL (ref 78.0–100.0)
PLATELETS: 226 10*3/uL (ref 150–400)
RBC: 2.79 MIL/uL — ABNORMAL LOW (ref 4.22–5.81)
RDW: 19.4 % — AB (ref 11.5–15.5)
WBC: 6 10*3/uL (ref 4.0–10.5)

## 2014-03-18 LAB — HEPARIN LEVEL (UNFRACTIONATED): Heparin Unfractionated: 0.34 IU/mL (ref 0.30–0.70)

## 2014-03-18 MED ORDER — HEPARIN (PORCINE) IN NACL 100-0.45 UNIT/ML-% IJ SOLN
1350.0000 [IU]/h | INTRAMUSCULAR | Status: DC
  Administered 2014-03-18: 1350 [IU]/h via INTRAVENOUS

## 2014-03-18 MED ORDER — SODIUM CHLORIDE 0.9 % IV SOLN: Freq: Once | INTRAVENOUS | Status: DC

## 2014-03-18 MED ORDER — SODIUM CHLORIDE 0.9 % IJ SOLN: 3.0000 mL | INTRAMUSCULAR | Status: DC | PRN

## 2014-03-18 MED ORDER — SODIUM CHLORIDE 0.9 % IV SOLN: 250.0000 mL | INTRAVENOUS | Status: DC | PRN

## 2014-03-18 MED ORDER — ASPIRIN EC 81 MG PO TBEC
81.0000 mg | DELAYED_RELEASE_TABLET | Freq: Every day | ORAL | Status: DC
  Administered 2014-03-20: 81 mg via ORAL
  Filled 2014-03-18: qty 1

## 2014-03-18 MED ORDER — SODIUM CHLORIDE 0.9 % IJ SOLN: 3.0000 mL | Freq: Two times a day (BID) | INTRAMUSCULAR | Status: DC

## 2014-03-18 MED ORDER — ASPIRIN 81 MG PO CHEW
81.0000 mg | CHEWABLE_TABLET | ORAL | Status: AC
  Administered 2014-03-19: 81 mg via ORAL
  Filled 2014-03-18: qty 1

## 2014-03-18 NOTE — Progress Notes (Signed)
[phone] Cardiac Rehab Pt on bedrest from groin bleeding and hemtoma, ambulation held. Started stent discharge education with pt. He voices understanding. We discussed Outpt. CRP and he agrees to referral to [place] progam. We will continue to follow pt to ambulate when groin site stable and to continue education. Deon Pilling, RN 03/18/2014 8:51 AM

## 2014-03-18 NOTE — Progress Notes (Signed)
Pt. Seen and examined. Agree with the NP/PA-C note as written.  Successful PCI to the LAD and POBA to a chronically occluded LCX. There is high grade proximal disease to a large proximal Ramus branch as well as mid-RCA disease. Will d/w interventional about further management. Plan was to only fix the RCA.  Would still prefer BMS.  Unfortunately he had a groin hematoma overnight - will likely have to approach from the left groin. HCT is drifting down, however, he was transfused the other day so this may be due to short-lived donor RBC's.  Creatinine has improved. Will need to follow and hydrate for staged PCI tomorrow.  Pixie Casino, MD, [facility] Attending Cardiologist [place]

## 2014-03-18 NOTE — Progress Notes (Signed)
ANTICOAGULATION CONSULT NOTE - Follow Up Consult  Pharmacy Consult for heparin Indication: chest pain/ACS  Allergies  Allergen Reactions  . Factive [Gemifloxacin]     Ineffective     Patient Measurements: Height: 5\' 7"  (170.2 cm) Weight: 122 lb 9.6 oz (55.611 kg) IBW/kg (Calculated) : 66.1 Heparin Dosing Weight: 55kg  Vital Signs: Temp: 98.3 F (36.8 C) (08/11 1925) Temp src: Oral (08/11 1925) BP: 123/61 mmHg (08/11 1925) Pulse Rate: 81 (08/11 1925)  Labs:  Recent Labs  03/16/14 0330 03/16/14 1457 03/17/14 0508 03/17/14 1410 03/18/14 0300 03/18/14 2026  HGB 7.6*  --  9.7*  --  8.7*  --   HCT 23.6*  --  29.4*  --  26.5*  --   PLT 250  --  240  --  226  --   HEPARINUNFRC 0.25* 0.79* 0.34  --   --  0.34  CREATININE  --   --   --  1.49* 1.38*  --     Estimated Creatinine Clearance: 36.4 ml/min (by C-G formula based on Cr of 1.38).   Assessment: 24 YOM s/p cath and intervention today and for staged PCI Wednesday 8/12. Heparin was restarted post cath and heparin level noted at goal.  Goal of Therapy:  Heparin level 0.3-0.7 units/ml Monitor platelets by anticoagulation protocol: Yes   Plan:  -No heparin changes needed -Heparin level and CBC in am  Hildred Laser, Pharm D 03/18/2014 9:14 PM

## 2014-03-18 NOTE — Progress Notes (Signed)
UR completed Shawntavia Saunders K. Dexter Sauser, RN, BSN, MSHL, CCM  03/18/2014 11:43 AM

## 2014-03-18 NOTE — Progress Notes (Signed)
Patient Name: Richard [name] Date of Encounter: 03/18/2014     Principal Problem:   Unstable Class III Angina Active Problems:   HYPOTHYROIDISM   HYPERLIPIDEMIA   ANEMIA   Wegener's granulomatosis   Chronic respiratory failure   Abnormal nuclear stress test: Intermediate risk with moderate region of apical and inferior scar with mild superimposed ischemia; EF 40%   CAD- severe 4 V CAD- not CABG candidate   Anemia- transfused 03/16/14   Chronic renal insufficiency, stage III (moderate)   Cardiomyopathy, ischemic- EF 40% by Myoview    SUBJECTIVE  No CP or SOB. Pain in R groin resolved. Per nursing staff, heparin was not started due to R groin hematoma. Femstop removed around 3 am, pressure dressing placed  CURRENT MEDS . aspirin EC  81 mg Oral Daily  . B-complex with vitamin C  1 tablet Oral Daily  . bisoprolol  2.5 mg Oral Daily  . budesonide-formoterol  2 puff Inhalation BID  . clopidogrel  75 mg Oral Q breakfast  . Cyclophosphamide  50 mg Oral Q1400  . dextromethorphan-guaiFENesin  1 tablet Oral BID  . dutasteride  0.5 mg Oral QODAY  . loratadine  10 mg Oral Daily  . nitroGLYCERIN  1 inch Topical 4 times per day  . pantoprazole  40 mg Oral Daily  . predniSONE  10 mg Oral Q breakfast  . psyllium  1 packet Oral Daily  . simethicone  160 mg Oral BID  . simvastatin  20 mg Oral q1800  . sulfamethoxazole-trimethoprim  1 tablet Oral Daily  . tamsulosin  0.4 mg Oral QPM    OBJECTIVE  Filed Vitals:   03/18/14 0145 03/18/14 0230 03/18/14 0330 03/18/14 0500  BP: 119/55 97/58 131/57 137/68  Pulse:    85  Temp:    99 F (37.2 C)  TempSrc:    Oral  Resp: 18 18 18 20   Height:      Weight:      SpO2: 100% 99% 100% 95%    Intake/Output Summary (Last 24 hours) at 03/18/14 0619 Last data filed at 03/18/14 0500  Gross per 24 hour  Intake   1020 ml  Output   1701 ml  Net   -681 ml   Filed Weights   03/16/14 0443 03/17/14 0515 03/18/14 0000  Weight: 120 lb 14.4 oz  (54.84 kg) 120 lb 6.4 oz (54.613 kg) 120 lb 13 oz (54.8 kg)    PHYSICAL EXAM  General: Pleasant, NAD. Neuro: Alert and oriented X 3. Moves all extremities spontaneously. Psych: Normal affect. HEENT:  Normal  Neck: Supple without bruits or JVD. Lungs:  Resp regular and unlabored, anterior exam CTA. Heart: RRR no s3, s4. 2/6 systolic murmur at upper L sternal border Abdomen: Soft, non-tender, non-distended, BS + x 4. R gorin pressure dressing in place. Small hematoma. Soft. No pulsatile mass or bruit on exam Extremities: No clubbing, cyanosis or edema. DP/PT/Radials 2+ and equal bilaterally.  Accessory Clinical Findings  CBC  Recent Labs  03/17/14 0508 03/18/14 0300  WBC 7.0 6.0  HGB 9.7* 8.7*  HCT 29.4* 26.5*  MCV 97.0 95.0  PLT 240 211   Basic Metabolic Panel  Recent Labs  03/17/14 1410 03/18/14 0300  NA 144 141  K 4.0 3.6*  CL 111 109  CO2 22 20  GLUCOSE 101* 127*  BUN 21 23  CREATININE 1.49* 1.38*  CALCIUM 7.9* 7.7*    TELE  NSR with HR 70-80s, no significant ventricular ectopy  ECG  NSR with HR 90s, poor R wave progression in anterior lead  Radiology/Studies  Dg Chest 2 View  03/13/2014   CLINICAL DATA:  Shortness of breath, cough, chest congestion, and chest pain. Wegener's granulomatosis.  EXAM: CHEST  2 VIEW  COMPARISON:  02/12/2014, 12/06/2013, 11/07/2013, 10/07/2013, and CT scan dated 10/04/2013  FINDINGS: The lungs are hyperinflated consistent with emphysema. There are multiple patchy areas of ill-defined infiltrate/ scarring as well as cavitary lesions in both lungs. The appearance is essentially unchanged. Heart size and pulmonary vascularity are normal. Large hiatal hernia, stable. No acute osseous abnormality.  IMPRESSION: Severe chronic lung disease with emphysema and multiple areas of scarring, cavitation, and patchy infiltrate consistent with Wegener's granulomatosis.  No significant change since 02/12/2014.   Electronically Signed   By: Rozetta Nunnery M.D.   On: 03/13/2014 11:38    ASSESSMENT AND PLAN  1. Unable angina  - Recent abnormal nuclear stress test: Intermediate risk with moderate region of apical and inferior scar with mild superimposed ischemia; EF 40%  - cath 8/7 EF 40%, severe multivessel CAD involing all 4 major branches, 90% prox LAD with 100% mid subtotal occlusion with miniimal distal flow after distal diag, 80-90 prox ramus, 100% mid OM subtotal occlusion prior to bifurcation, diffuse mid RCA disease with seq 95% stenosis as well as 50-60% stenosis prior to major marginal  - turned down by by CT surgery due to comorbidities 03/14/14  - high risk post-op per pulmonology  - staged high risk PCI and BMS recommended due to bleeding and renal concern  - cath 03/17/2014 BMS to 95% prox LAD, and PTCA of chronically occluded LCx marginal vessel  - hydrate to allow kidney recover, plan for staged PCI for RCA on Wed or Thur  - could not start heparin due to hematoma, continue pressure dressing, start heparin around noon  2. 3-v CAD 3. Hypothyroidism 4. Hyperlipidemia 5. Anemia 6. Wegener's granulomatosis 7. Multifocal bronchiectasis 8. R groin hematoma: no pulsatile mass or bruit on exam, likelihood of pseudoaneurysm low. Distal pulse good. Continue pressure dressing 9. 2/6 systolic murmur likely AS: obtain TTE  Signed, Almyra Deforest PA-C Pager: [phone]

## 2014-03-19 ENCOUNTER — Encounter (HOSPITAL_COMMUNITY): Payer: Medicare Other | Admitting: Certified Registered Nurse Anesthetist

## 2014-03-19 ENCOUNTER — Encounter (HOSPITAL_COMMUNITY)
Admission: EM | Disposition: A | Discharge status: Home / Self Care | Payer: Self-pay | Source: Home / Self Care | Attending: Internal Medicine

## 2014-03-19 ENCOUNTER — Inpatient Hospital Stay (HOSPITAL_COMMUNITY): Payer: Medicare Other | Admitting: Certified Registered Nurse Anesthetist

## 2014-03-19 ENCOUNTER — Inpatient Hospital Stay (HOSPITAL_COMMUNITY): Payer: Medicare Other

## 2014-03-19 DIAGNOSIS — D62 Acute posthemorrhagic anemia: Secondary | ICD-10-CM

## 2014-03-19 DIAGNOSIS — I724 Aneurysm of artery of lower extremity: Secondary | ICD-10-CM

## 2014-03-19 HISTORY — PX: HEMATOMA EVACUATION: SHX5118

## 2014-03-19 LAB — CBC
HCT: 26.4 % — ABNORMAL LOW (ref 39.0–52.0)
HEMATOCRIT: 22.5 % — AB (ref 39.0–52.0)
HEMOGLOBIN: 8.9 g/dL — AB (ref 13.0–17.0)
Hemoglobin: 7.4 g/dL — ABNORMAL LOW (ref 13.0–17.0)
MCH: 31.4 pg (ref 26.0–34.0)
MCH: 31.1 pg (ref 26.0–34.0)
MCHC: 32.9 g/dL (ref 30.0–36.0)
MCHC: 33.7 g/dL (ref 30.0–36.0)
MCV: 95.3 fL (ref 78.0–100.0)
MCV: 92.3 fL (ref 78.0–100.0)
Platelets: 216 10*3/uL (ref 150–400)
Platelets: 181 10*3/uL (ref 150–400)
RBC: 2.36 MIL/uL — ABNORMAL LOW (ref 4.22–5.81)
RBC: 2.86 MIL/uL — AB (ref 4.22–5.81)
RDW: 18.9 % — ABNORMAL HIGH (ref 11.5–15.5)
RDW: 17.7 % — ABNORMAL HIGH (ref 11.5–15.5)
WBC: 6.3 10*3/uL (ref 4.0–10.5)
WBC: 5.5 10*3/uL (ref 4.0–10.5)

## 2014-03-19 LAB — PREPARE RBC (CROSSMATCH)

## 2014-03-19 LAB — HEPARIN LEVEL (UNFRACTIONATED): Heparin Unfractionated: 0.31 IU/mL (ref 0.30–0.70)

## 2014-03-19 SURGERY — PERCUTANEOUS CORONARY STENT INTERVENTION (PCI-S)
Anesthesia: LOCAL

## 2014-03-19 SURGERY — EVACUATION HEMATOMA
Anesthesia: General | Site: Groin | Laterality: Right

## 2014-03-19 MED ORDER — SODIUM CHLORIDE 0.9 % IV SOLN: 500.0000 mL | Freq: Once | INTRAVENOUS | Status: AC | PRN

## 2014-03-19 MED ORDER — HEPARIN (PORCINE) IN NACL 100-0.45 UNIT/ML-% IJ SOLN
600.0000 [IU]/h | INTRAMUSCULAR | Status: DC
  Administered 2014-03-19: 600 [IU]/h via INTRAVENOUS
  Filled 2014-03-19: qty 250

## 2014-03-19 MED ORDER — PHENYLEPHRINE HCL 10 MG/ML IJ SOLN
INTRAMUSCULAR | Status: DC | PRN
Start: 2014-03-19 — End: 2014-03-19
  Administered 2014-03-19 (×2): 120 ug via INTRAVENOUS

## 2014-03-19 MED ORDER — LACTATED RINGERS IV SOLN
INTRAVENOUS | Status: DC | PRN
  Administered 2014-03-19: 13:00:00 via INTRAVENOUS

## 2014-03-19 MED ORDER — SODIUM CHLORIDE 0.9 % IV SOLN: INTRAVENOUS | Status: DC

## 2014-03-19 MED ORDER — SODIUM CHLORIDE 0.9 % IR SOLN
Status: DC | PRN
  Administered 2014-03-19: 1000 mL

## 2014-03-19 MED ORDER — SODIUM CHLORIDE 0.9 % IV SOLN
Freq: Once | INTRAVENOUS | Status: AC
  Administered 2014-03-19: 12:00:00 via INTRAVENOUS

## 2014-03-19 MED ORDER — FENTANYL CITRATE 0.05 MG/ML IJ SOLN
INTRAMUSCULAR | Status: DC | PRN
  Administered 2014-03-19 (×2): 50 ug via INTRAVENOUS

## 2014-03-19 MED ORDER — CEFAZOLIN SODIUM-DEXTROSE 2-3 GM-% IV SOLR
INTRAVENOUS | Status: DC | PRN
  Administered 2014-03-19: 2 g via INTRAVENOUS

## 2014-03-19 MED ORDER — FENTANYL CITRATE 0.05 MG/ML IJ SOLN
INTRAMUSCULAR | Status: AC
  Filled 2014-03-19: qty 5

## 2014-03-19 MED ORDER — EPHEDRINE SULFATE 50 MG/ML IJ SOLN
INTRAMUSCULAR | Status: DC | PRN
  Administered 2014-03-19: 10 mg via INTRAVENOUS

## 2014-03-19 MED ORDER — METOPROLOL TARTRATE 1 MG/ML IV SOLN: 2.0000 mg | INTRAVENOUS | Status: DC | PRN

## 2014-03-19 MED ORDER — ALUM & MAG HYDROXIDE-SIMETH 200-200-20 MG/5ML PO SUSP: 15.0000 mL | ORAL | Status: DC | PRN

## 2014-03-19 MED ORDER — SODIUM CHLORIDE 0.9 % IV SOLN
INTRAVENOUS | Status: DC | PRN
  Administered 2014-03-19 (×2): via INTRAVENOUS

## 2014-03-19 MED ORDER — PROPOFOL 10 MG/ML IV BOLUS
INTRAVENOUS | Status: AC
  Filled 2014-03-19: qty 20

## 2014-03-19 MED ORDER — LIDOCAINE HCL (CARDIAC) 20 MG/ML IV SOLN
INTRAVENOUS | Status: DC | PRN
  Administered 2014-03-19: 50 mg via INTRAVENOUS

## 2014-03-19 MED ORDER — MORPHINE SULFATE 2 MG/ML IJ SOLN
2.0000 mg | INTRAMUSCULAR | Status: DC | PRN
  Administered 2014-03-19: 2 mg via INTRAVENOUS
  Filled 2014-03-19: qty 1

## 2014-03-19 MED ORDER — OXYCODONE HCL 5 MG PO TABS
5.0000 mg | ORAL_TABLET | ORAL | Status: DC | PRN
  Administered 2014-03-19 – 2014-03-20 (×2): 5 mg via ORAL
  Administered 2014-03-20: 10 mg via ORAL
  Filled 2014-03-19: qty 2
  Filled 2014-03-19 (×2): qty 1

## 2014-03-19 MED ORDER — DOCUSATE SODIUM 100 MG PO CAPS
100.0000 mg | ORAL_CAPSULE | Freq: Every day | ORAL | Status: DC
  Administered 2014-03-20: 100 mg via ORAL
  Filled 2014-03-19 (×2): qty 1

## 2014-03-19 MED ORDER — SODIUM CHLORIDE 0.9 % IR SOLN
Status: DC | PRN
  Administered 2014-03-19: 14:00:00

## 2014-03-19 MED ORDER — PHENOL 1.4 % MT LIQD: 1.0000 | OROMUCOSAL | Status: DC | PRN

## 2014-03-19 MED ORDER — LACTATED RINGERS IV SOLN
INTRAVENOUS | Status: DC | PRN
  Administered 2014-03-19: 14:00:00 via INTRAVENOUS

## 2014-03-19 MED ORDER — HYDRALAZINE HCL 20 MG/ML IJ SOLN: 10.0000 mg | INTRAMUSCULAR | Status: DC | PRN

## 2014-03-19 MED ORDER — ONDANSETRON HCL 4 MG/2ML IJ SOLN
INTRAMUSCULAR | Status: DC | PRN
  Administered 2014-03-19: 4 mg via INTRAVENOUS

## 2014-03-19 MED ORDER — CEFAZOLIN SODIUM-DEXTROSE 2-3 GM-% IV SOLR
2.0000 g | Freq: Once | INTRAVENOUS | Status: DC
  Filled 2014-03-19: qty 50

## 2014-03-19 MED ORDER — MIDAZOLAM HCL 2 MG/2ML IJ SOLN
INTRAMUSCULAR | Status: AC
Start: 2014-03-19 — End: 2014-03-19
  Filled 2014-03-19: qty 2

## 2014-03-19 MED ORDER — LABETALOL HCL 5 MG/ML IV SOLN
10.0000 mg | INTRAVENOUS | Status: DC | PRN
  Filled 2014-03-19: qty 4

## 2014-03-19 MED ORDER — POTASSIUM CHLORIDE CRYS ER 20 MEQ PO TBCR: 20.0000 meq | EXTENDED_RELEASE_TABLET | Freq: Every day | ORAL | Status: DC | PRN

## 2014-03-19 MED ORDER — SUCCINYLCHOLINE CHLORIDE 20 MG/ML IJ SOLN
INTRAMUSCULAR | Status: DC | PRN
  Administered 2014-03-19: 100 mg via INTRAVENOUS

## 2014-03-19 MED ORDER — BISACODYL 10 MG RE SUPP
10.0000 mg | Freq: Every day | RECTAL | Status: DC | PRN
Start: 2014-03-19 — End: 2014-03-26

## 2014-03-19 MED ORDER — DEXTROSE 5 % IV SOLN
1.5000 g | Freq: Two times a day (BID) | INTRAVENOUS | Status: AC
  Administered 2014-03-20 (×2): 1.5 g via INTRAVENOUS
  Filled 2014-03-19 (×2): qty 1.5

## 2014-03-19 MED ORDER — ONDANSETRON HCL 4 MG/2ML IJ SOLN
INTRAMUSCULAR | Status: AC
  Filled 2014-03-19: qty 2

## 2014-03-19 MED ORDER — DOPAMINE-DEXTROSE 3.2-5 MG/ML-% IV SOLN: 3.0000 ug/kg/min | INTRAVENOUS | Status: DC

## 2014-03-19 MED ORDER — PROPOFOL 10 MG/ML IV BOLUS
INTRAVENOUS | Status: DC | PRN
  Administered 2014-03-19: 70 mg via INTRAVENOUS
  Administered 2014-03-19: 130 mg via INTRAVENOUS

## 2014-03-19 MED ORDER — PHENYLEPHRINE HCL 10 MG/ML IJ SOLN
10.0000 mg | INTRAVENOUS | Status: DC | PRN
  Administered 2014-03-19: 100 ug/min via INTRAVENOUS

## 2014-03-19 MED ORDER — LIDOCAINE HCL (CARDIAC) 20 MG/ML IV SOLN
INTRAVENOUS | Status: AC
  Filled 2014-03-19: qty 5

## 2014-03-19 MED FILL — Sodium Chloride IV Soln 0.9%: INTRAVENOUS | Qty: 50 | Status: AC

## 2014-03-19 SURGICAL SUPPLY — 53 items
ADH SKN CLS APL DERMABOND .7 (GAUZE/BANDAGES/DRESSINGS) ×1
BANDAGE ESMARK 6X9 LF (GAUZE/BANDAGES/DRESSINGS) IMPLANT
BLADE 10 SAFETY STRL DISP (BLADE) ×3 IMPLANT
BNDG CMPR 9X6 STRL LF SNTH (GAUZE/BANDAGES/DRESSINGS)
BNDG ESMARK 6X9 LF (GAUZE/BANDAGES/DRESSINGS)
CANISTER SUCTION 2500CC (MISCELLANEOUS) ×3 IMPLANT
CLIP TI MEDIUM 24 (CLIP) ×2 IMPLANT
CLIP TI MEDIUM 6 (CLIP) ×6 IMPLANT
CLIP TI WIDE RED SMALL 24 (CLIP) ×2 IMPLANT
CLIP TI WIDE RED SMALL 6 (CLIP) ×5 IMPLANT
COVER SURGICAL LIGHT HANDLE (MISCELLANEOUS) ×3 IMPLANT
CUFF TOURNIQUET SINGLE 24IN (TOURNIQUET CUFF) IMPLANT
CUFF TOURNIQUET SINGLE 34IN LL (TOURNIQUET CUFF) IMPLANT
CUFF TOURNIQUET SINGLE 44IN (TOURNIQUET CUFF) IMPLANT
DERMABOND ADVANCED (GAUZE/BANDAGES/DRESSINGS) ×2
DERMABOND ADVANCED .7 DNX12 (GAUZE/BANDAGES/DRESSINGS) ×1 IMPLANT
DRAIN CHANNEL 10M FLAT 3/4 FLT (DRAIN) ×3 IMPLANT
DRAIN SNY 10X20 3/4 PERF (WOUND CARE) IMPLANT
DRAPE WARM FLUID 44X44 (DRAPE) ×3 IMPLANT
DRSG COVADERM 4X8 (GAUZE/BANDAGES/DRESSINGS) ×3 IMPLANT
ELECT REM PT RETURN 9FT ADLT (ELECTROSURGICAL) ×3
ELECTRODE REM PT RTRN 9FT ADLT (ELECTROSURGICAL) ×1 IMPLANT
EVACUATOR SILICONE 100CC (DRAIN) ×3 IMPLANT
GLOVE BIO SURGEON STRL SZ 6.5 (GLOVE) ×1 IMPLANT
GLOVE BIO SURGEONS STRL SZ 6.5 (GLOVE) ×1
GLOVE BIOGEL PI IND STRL 6.5 (GLOVE) IMPLANT
GLOVE BIOGEL PI INDICATOR 6.5 (GLOVE) ×4
GLOVE ECLIPSE 6.5 STRL STRAW (GLOVE) ×2 IMPLANT
GLOVE SS BIOGEL STRL SZ 7 (GLOVE) ×1 IMPLANT
GLOVE SUPERSENSE BIOGEL SZ 7 (GLOVE) ×2
GOWN STRL REUS W/ TWL LRG LVL3 (GOWN DISPOSABLE) ×3 IMPLANT
GOWN STRL REUS W/TWL LRG LVL3 (GOWN DISPOSABLE) ×9
KIT BASIN OR (CUSTOM PROCEDURE TRAY) ×3 IMPLANT
KIT ROOM TURNOVER OR (KITS) ×3 IMPLANT
NS IRRIG 1000ML POUR BTL (IV SOLUTION) ×6 IMPLANT
PACK PERIPHERAL VASCULAR (CUSTOM PROCEDURE TRAY) ×3 IMPLANT
PAD ARMBOARD 7.5X6 YLW CONV (MISCELLANEOUS) ×6 IMPLANT
PADDING CAST COTTON 6X4 STRL (CAST SUPPLIES) IMPLANT
PROBE PENCIL 8 MHZ STRL DISP (MISCELLANEOUS) ×3 IMPLANT
SUT PROLENE 5 0 C 1 24 (SUTURE) ×3 IMPLANT
SUT PROLENE 6 0 BV (SUTURE) IMPLANT
SUT PROLENE 6 0 C 1 24 (SUTURE) ×9 IMPLANT
SUT PROLENE 6 0 CC (SUTURE) ×2 IMPLANT
SUT VIC AB 2-0 CT1 27 (SUTURE) ×3
SUT VIC AB 2-0 CT1 TAPERPNT 27 (SUTURE) IMPLANT
SUT VIC AB 2-0 CTX 36 (SUTURE) ×3 IMPLANT
SUT VIC AB 3-0 SH 27 (SUTURE) ×3
SUT VIC AB 3-0 SH 27X BRD (SUTURE) ×1 IMPLANT
TOWEL OR 17X24 6PK STRL BLUE (TOWEL DISPOSABLE) ×6 IMPLANT
TOWEL OR 17X26 10 PK STRL BLUE (TOWEL DISPOSABLE) ×3 IMPLANT
TRAY FOLEY CATH 16FRSI W/METER (SET/KITS/TRAYS/PACK) ×3 IMPLANT
UNDERPAD 30X30 INCONTINENT (UNDERPADS AND DIAPERS) ×3 IMPLANT
WATER STERILE IRR 1000ML POUR (IV SOLUTION) ×3 IMPLANT

## 2014-03-19 NOTE — Anesthesia Postprocedure Evaluation (Signed)
  Anesthesia Post-op Note  Patient: Richard [name]  Procedure(s) Performed: Procedure(s): Suture repair of femoral artery with evacuation of hematoma (Right)  Patient Location: PACU  Anesthesia Type:General  Level of Consciousness: awake, alert  and oriented  Airway and Oxygen Therapy: Patient Spontanous Breathing and Patient connected to nasal cannula oxygen  Post-op Pain: mild  Post-op Assessment: Post-op Vital signs reviewed, Patient's Cardiovascular Status Stable, Respiratory Function Stable, Patent Airway and Pain level controlled  Post-op Vital Signs: stable  Last Vitals:  Filed Vitals:   03/19/14 1759  BP:   Pulse:   Temp: 37.1 C  Resp:     Complications: No apparent anesthesia complications

## 2014-03-19 NOTE — Anesthesia Procedure Notes (Addendum)
Performed by: Trixie Deis A   Procedure Name: Intubation Date/Time: 03/19/2014 1:32 PM Performed by: Trixie Deis A Pre-anesthesia Checklist: Patient identified Patient Re-evaluated:Patient Re-evaluated prior to inductionOxygen Delivery Method: Circle system utilized Intubation Type: IV induction and Rapid sequence Laryngoscope Size: Mac and 3 Grade View: Grade II Tube type: Oral Tube size: 7.5 mm Number of attempts: 1 Placement Confirmation: ETT inserted through vocal cords under direct vision,  positive ETCO2,  CO2 detector and breath sounds checked- equal and bilateral Secured at: 21 cm Tube secured with: Tape

## 2014-03-19 NOTE — Op Note (Signed)
OPERATIVE REPORT  Date of Surgery: 03/13/2014 - 03/19/2014  Surgeon: Tinnie Gens, MD  Assistant: Dorise Bullion  Pre-op Diagnosis: Right femoral pseudoaneurysm with hematoma post cardiac cath and intervention 48 hours earlier  Post-op Diagnosis: Same  Procedure: Procedure(s): Suture repair of femoral artery with evacuation of hematoma  Anesthesia: LMA  EBL: Minimal  Complications: None  Procedure Details: This was identified and placed in supine position at which time satisfactory Gen.-LMA anesthesia was administered. Central line was placed by anesthesia and right inguinal area was prepped with Betadine scrub and solution draped in routine sterile manner. There was a diffuse hematoma throughout the right inguinal area with pulsatile mass just below the inguinal ligament. A longitudinal incision was made to the inguinal area carried down to subcutaneous tissue. There was diffuse hematoma tracking down to the femoral vessels. Once the deep plane was entered there was brisk arterial bleeding which was controlled manually. Dissection continued distally with the superficial femoral artery encircled with vessel loop. An excellent pulse. The saphenofemoral junction was ligated with 2-0 silk and divided to assist in exposure in the common femoral artery was identified proximally and encircled with vessel loop. Profunda femoris was also dissected free in all vessels occluded with vascular clamps. No heparin was given. There was a clean stick on the posterolateral aspect of the very proximal portion of the common femoral artery where the bleeding had occurred. This was suture-ligated with a 6-0 Prolene in 2 areas. Following this the clamps released there was an excellent pulse in all vessels. Hematoma had pretty much diffusely dissected into the tissues and could not evacuated completely. Adequate hemostasis was achieved a 10 flat Jackson-Pratt drain was brought out through an inferior-based stab wound  secured with silk suture and the wound closed in layers with Vicryl subcuticular fashion and Dermabond patient to the recovery room in satisfactory condition with 3 posterior cells pedis pulse palpable the right foot   Tinnie Gens, MD 03/19/2014 2:31 PM

## 2014-03-19 NOTE — Progress Notes (Signed)
ANTICOAGULATION CONSULT NOTE - Follow Up Consult  Pharmacy Consult for heparin Indication: chest pain/ACS  Allergies  Allergen Reactions  . Factive [Gemifloxacin]     Ineffective    Patient Measurements: Height: 5\' 7"  (170.2 cm) Weight: 122 lb 15.9 oz (55.789 kg) IBW/kg (Calculated) : 66.1 Heparin Dosing Weight: 55kg  Vital Signs: Temp: 99.4 F (37.4 C) (08/12 0624) Temp src: Oral (08/12 0624) BP: 116/66 mmHg (08/12 0624) Pulse Rate: 79 (08/12 0624)  Labs:  Recent Labs  03/17/14 0508 03/17/14 1410 03/18/14 0300 03/18/14 2026 03/19/14 0347  HGB 9.7*  --  8.7*  --  7.4*  HCT 29.4*  --  26.5*  --  22.5*  PLT 240  --  226  --  216  HEPARINUNFRC 0.34  --   --  0.34 0.31  CREATININE  --  1.49* 1.38*  --   --    Estimated Creatinine Clearance: 36.5 ml/min (by C-G formula based on Cr of 1.38).  Assessment: 20 YOM s/p cath and intervention today and for staged PCI Wednesday 8/12. Heparin was restarted post cath and heparin level noted at goal.  Heparin level is 0.31 this AM and no noted acute bleeding.  His H/H has dropped some and he developed a groin hematoma post cath.  Will continue current rate and f/u after cath.  Goal of Therapy:  Heparin level 0.3-0.7 units/ml Monitor platelets by anticoagulation protocol: Yes   Plan:  -No heparin changes needed -Heparin level and CBC in am  Rober Minion, PharmD., MS Clinical Pharmacist Pager:  [phone] Thank you for allowing pharmacy to be part of this patients care team. 03/19/2014 9:01 AM

## 2014-03-19 NOTE — Progress Notes (Signed)
CARDIAC REHAB PHASE I   PRE:  Rate/Rhythm: 95 SR    BP: sitting right 90/54, left 96/60    SaO2: 94 RA  MODE:  Ambulation: held  Came to ambulate pt before cath however I was made aware that his HR was up to 140s ST with bathing, BP is low for him, Hgb today 7.9. RN appreciates continued hematoma with bruit. Will hold ambulation and f/u tomorrow. [phone]  Josephina Shih Rehobeth CES, ACSM 03/19/2014 8:41 AM

## 2014-03-19 NOTE — Progress Notes (Signed)
Pt. Seen and examined. Agree with the NP/PA-C note as written. There is enlarging hematoma at the right groin. A femoral bruit is noted. Will order STAT vascular ultrasound and if there is a pseduoaneurysm, will attempt compression closure. Stop HEPARIN. Continue plavix. Re-check H/H this afternoon, if HgB <7, would transfuse 2u PRBC's with 40 mg IV lasix in between. Cancel staged PCI today.  Pixie Casino, MD, [facility] Attending Cardiologist [place]

## 2014-03-19 NOTE — Progress Notes (Signed)
Patient's BP in 90/50's this am with heart rate up to 140-150's while washing up this morning.  Patient does report some shortness of breath with activity this morning.  Upon initial assessment this morning patient's right groin stage 2 with large amount of bruising and large hematoma, +bruit heard on auscultation.  Brittainy Simmons on floor and made aware.  Will continue to monitor.  Sanda Linger

## 2014-03-19 NOTE — Consult Note (Signed)
Vascular Surgery Consultation  Reason for Consult: Right femoral false aneurysm and hematoma 2 days post cardiac cath and intervention  HPI: Richard [name] is a 78 y.o. male who presents for evaluation of pseudoaneurysm right femoral artery following cardiac cath and intervention 48 hours earlier. Patient had PTA and stenting of LAD and circumflex on Monday, August 10. Patient has had diffuse hematoma which has enlarged and today duplex scan reveals multiloculated pseudoaneurysm. The patient has been stable hemodynamically but has had pain in the right inguinal area. Heparin was stopped one hour ago. Patient today has hemoglobin of 7 g.   Past Medical History  Diagnosis Date  . Allergy   . GERD (gastroesophageal reflux disease)   . Arthritis   . Asthma   . Fungal infection     lungs  . Cataract   . Ulcer   . BPH (benign prostatic hyperplasia)     sees Dr. Risa Grill, biopsy June 2015 was benign   . Wegener's granulomatosis 2011    sees Dr. [name]   . Peptic stricture of esophagus   . Anemia   . Hiatal hernia   . Adenomatous colon polyp 08/2011  . Bronchiectasis   . On home oxygen therapy 12-18-12    uses 2 l/m nasally at bedtime  . HOH (hard of hearing) 12-18-12    bilaterally  . HBP (high blood pressure) 09/28/2013   Past Surgical History  Procedure Laterality Date  . Hernia repair    . Eye surgery      cataracts removed.   . Colonoscopy  08-16-11    per Dr. Fuller Plan, diverticulosis and polyps, repeat in 5 yrs   . Esophagogastroduodenoscopy (egd) with esophageal dilation  11-29-10    per Dr. Fuller Plan   . Cataract extraction, bilateral  12-18-12    bilateral  . Cholecystectomy N/A 12/21/2012    Procedure: LAPAROSCOPIC CHOLECYSTECTOMY WITH INTRAOPERATIVE CHOLANGIOGRAM;  Surgeon: Edward Jolly, MD;  Location: [facility];  Service: General;  Laterality: N/A;   History   Social History  . Marital Status: Married    Spouse Name: N/A    Number of Children: N/A  . Years of Education:  N/A   Occupational History  . Retired    Social History Main Topics  . Smoking status: Never Smoker   . Smokeless tobacco: Never Used  . Alcohol Use: No  . Drug Use: No  . Sexual Activity: Yes   Other Topics Concern  . None   Social History Narrative  . None   Family History  Problem Relation Age of Onset  . Asthma Father   . Coronary artery disease Brother   . Coronary artery disease Brother   . Coronary artery disease Mother    Allergies  Allergen Reactions  . Factive [Gemifloxacin]     Ineffective    Prior to Admission medications   Medication Sig Start Date End Date Taking? Authorizing Provider  acetaminophen (TYLENOL) 325 MG tablet Take 650 mg by mouth every 6 (six) hours as needed for mild pain.   Yes Historical Provider, MD  aspirin 81 MG tablet Take 81 mg by mouth every evening.    Yes Historical Provider, MD  b complex vitamins tablet Take 1 tablet by mouth daily.     Yes Historical Provider, MD  BIOTIN PO Take 1 tablet by mouth 3 (three) times daily with meals.   Yes Historical Provider, MD  bisacodyl (DULCOLAX) 5 MG EC tablet Take 5 mg by mouth daily as needed. CONSTIPATION  Yes Historical Provider, MD  budesonide-formoterol (SYMBICORT) 160-4.5 MCG/ACT inhaler Inhale 2 puffs into the lungs 2 (two) times daily.   Yes Historical Provider, MD  cyclophosphamide (CYTOXAN) 50 MG tablet Take 50 mg by mouth daily. Give on an empty stomach once daily at 1600. Take  1 hour before or 2 hours after meals. 10/24/13  Yes Tanda Rockers, MD  dextromethorphan (DELSYM) 30 MG/5ML liquid Take 30 mg by mouth 2 (two) times daily as needed for cough.   Yes Historical Provider, MD  dextromethorphan-guaiFENesin (MUCINEX DM) 30-600 MG per 12 hr tablet Take 1 tablet by mouth 2 (two) times daily.    Yes Historical Provider, MD  dutasteride (AVODART) 0.5 MG capsule Take 0.5 mg by mouth every other day. In the evening.   Yes Historical Provider, MD  esomeprazole (NEXIUM) 20 MG capsule Take 20  mg by mouth 2 (two) times daily before a meal.    Yes Historical Provider, MD  levalbuterol (XOPENEX HFA) 45 MCG/ACT inhaler Inhale 1-2 puffs into the lungs every 4 (four) hours as needed for wheezing. 04/26/13  Yes Tanda Rockers, MD  loratadine (CLARITIN) 10 MG tablet Take 10 mg by mouth daily.    Yes Historical Provider, MD  methylcellulose (CITRUCEL) oral powder Take 1 packet by mouth daily.   Yes Historical Provider, MD  predniSONE (DELTASONE) 10 MG tablet Take 10 mg by mouth daily with breakfast.   Yes Historical Provider, MD  Simethicone (GAS-X PO) Take 2 tablets by mouth 2 (two) times daily.    Yes Historical Provider, MD  sulfamethoxazole-trimethoprim (BACTRIM DS) 800-160 MG per tablet Take 1 tablet by mouth daily. Take with Cytoxan.   Yes Historical Provider, MD  tamsulosin (FLOMAX) 0.4 MG CAPS capsule Take 1 capsule (0.4 mg total) by mouth every evening. 02/18/14  Yes [name], MD  traMADol Veatrice Bourbon) 50 MG tablet 1-2 every 4 hours as needed for cough or pain 09/12/13  Yes Tanda Rockers, MD     Positive ROS: Recently has had chest pain and dyspnea on exertion which resulted in cardiac evaluation.  All other systems have been reviewed and were otherwise negative with the exception of those mentioned in the HPI and as above.  Physical Exam: Filed Vitals:   03/19/14 1100  BP: 118/64  Pulse:   Temp:   Resp:     General: Alert, no acute distress HEENT: Normal for age Cardiovascular: Regular rate and rhythm. Carotid pulses 2+, no bruits audible Respiratory: Clear to auscultation. No cyanosis, no use of accessory musculature GI: No organomegaly, abdomen is soft and non-tender Skin: No lesions in the area of chief complaint Neurologic: Sensation intact distally Psychiatric: Patient is competent for consent with normal mood and affect Musculoskeletal: No obvious deformities Extremities: Right inguinal area with diffuse hematoma and ecchymosis from above the inguinal ligament to  the mid thigh. 4 cm pulsatile mass. 3+ dorsalis pedis pulse palpable right foot. Left leg free of abnormalities with 2+ femoral and 2 posterior Salas pedis pulse palpable.  Labs reviewed: Hemoglobin 7 g today   Assessment/Plan:  Patient needs exploration right femoral artery with evacuation hematoma and repair femoral artery. Will transfuse 2 units of packed red blood cells now and proceed in about one to 2 hours with surgery. Discusses with patient and his family they understand   Tinnie Gens, MD 03/19/2014 11:19 AM

## 2014-03-19 NOTE — Plan of Care (Signed)
Problem: Problem: Cardiovascular Progression Goal: NO VASCULAR COMPLICATION Outcome: Progressing Scheduled for pseudoaneursym surgery today

## 2014-03-19 NOTE — Anesthesia Preprocedure Evaluation (Signed)
Anesthesia Evaluation    Airway Mallampati: I TM Distance: >3 FB Neck ROM: Full    Dental  (+) Partial Upper, Partial Lower, Dental Advisory Given   Pulmonary          Cardiovascular hypertension,     Neuro/Psych    GI/Hepatic   Endo/Other    Renal/GU      Musculoskeletal   Abdominal   Peds  Hematology   Anesthesia Other Findings   Reproductive/Obstetrics                           Anesthesia Physical Anesthesia Plan  ASA: III  Anesthesia Plan: General   Post-op Pain Management:    Induction: Intravenous  Airway Management Planned: Oral ETT  Additional Equipment:   Intra-op Plan:   Post-operative Plan:   Informed Consent: I have reviewed the patients History and Physical, chart, labs and discussed the procedure including the risks, benefits and alternatives for the proposed anesthesia with the patient or authorized representative who has indicated his/her understanding and acceptance.   Dental advisory given  Plan Discussed with: CRNA and Anesthesiologist  Anesthesia Plan Comments:         Anesthesia Quick Evaluation

## 2014-03-19 NOTE — Progress Notes (Signed)
Subjective: Currently CP free. Right femoral access site is sore but he denies severe pain. No right flank or low back pain. He reports feeling mildly lightheaded and SOB while standing at the sink, bathing earlier (HR was in the 140s). No syncope/near syncope. No symptoms at rest.   Objective: Vital signs in last 24 hours: Temp:  [98.3 F (36.8 C)-99.5 F (37.5 C)] 99.4 F (37.4 C) (08/12 0624) Pulse Rate:  [79-92] 79 (08/12 0624) Resp:  [18-20] 20 (08/12 0624) BP: (107-145)/(61-83) 116/66 mmHg (08/12 0624) SpO2:  [97 %-99 %] 97 % (08/12 0624) Weight:  [122 lb 9.6 oz (55.611 kg)-122 lb 15.9 oz (55.789 kg)] 122 lb 15.9 oz (55.789 kg) (08/12 0624) Last BM Date: 03/17/14  Intake/Output from previous day: 08/11 0701 - 08/12 0700 In: 1246.2 [P.O.:320; I.V.:926.2] Out: 575 [Urine:575] Intake/Output this shift:    Medications Current Facility-Administered Medications  Medication Dose Route Frequency Provider Last Rate Last Dose  . 0.9 %  sodium chloride infusion   Intravenous Continuous Troy Sine, MD 50 mL/hr at 03/18/14 1700    . 0.9 %  sodium chloride infusion  250 mL Intravenous PRN Almyra Deforest, PA      . 0.9 %  sodium chloride infusion   Intravenous Once Almyra Deforest, PA 50 mL/hr at 03/18/14 1709    . acetaminophen (TYLENOL) tablet 650 mg  650 mg Oral Q4H PRN Troy Sine, MD      . ALPRAZolam Duanne Moron) tablet 0.25 mg  0.25 mg Oral BID PRN Lonn Georgia, PA-C      . [START ON 03/20/2014] aspirin EC tablet 81 mg  81 mg Oral Daily Pixie Casino, MD      . B-complex with vitamin C tablet 1 tablet  1 tablet Oral Daily Evelene Croon Barrett, PA-C   1 tablet at 03/18/14 0929  . bisacodyl (DULCOLAX) EC tablet 5 mg  5 mg Oral Daily PRN Evelene Croon Barrett, PA-C   5 mg at 03/15/14 1528  . bisoprolol (ZEBETA) tablet 2.5 mg  2.5 mg Oral Daily Lendon Colonel, NP      . budesonide-formoterol [facility]) 160-4.5 MCG/ACT inhaler 2 puff  2 puff Inhalation BID Lonn Georgia, PA-C   2 puff at  03/18/14 2047  . clopidogrel (PLAVIX) tablet 75 mg  75 mg Oral Q breakfast Troy Sine, MD   75 mg at 03/18/14 0906  . Cyclophosphamide CAPS 50 mg  50 mg Oral Q1400 Minus Breeding, MD   50 mg at 03/18/14 1303  . dextromethorphan (DELSYM) 30 MG/5ML liquid 30 mg  30 mg Oral BID PRN Rhonda G Barrett, PA-C      . dextromethorphan-guaiFENesin (MUCINEX DM) 30-600 MG per 12 hr tablet 1 tablet  1 tablet Oral BID Evelene Croon Barrett, PA-C   1 tablet at 03/18/14 2054  . dutasteride (AVODART) capsule 0.5 mg  0.5 mg Oral QODAY Rhonda G Barrett, PA-C   0.5 mg at 03/18/14 2054  . heparin ADULT infusion 100 units/mL (25000 units/250 mL)  1,350 Units/hr Intravenous Continuous Pixie Casino, MD 13.5 mL/hr at 03/18/14 1700 1,350 Units/hr at 03/18/14 1700  . levalbuterol (XOPENEX) nebulizer solution 0.63 mg  0.63 mg Inhalation Q4H PRN Evelene Croon Barrett, PA-C   0.63 mg at 03/14/14 1612  . loratadine (CLARITIN) tablet 10 mg  10 mg Oral Daily Rhonda G Barrett, PA-C   10 mg at 03/18/14 0929  . nitroGLYCERIN (NITROGLYN) 2 % ointment 1 inch  1 inch Topical 4 times  per day Rogelia Mire, NP   1 inch at 03/19/14 0023  . nitroGLYCERIN (NITROSTAT) SL tablet 0.4 mg  0.4 mg Sublingual Q5 Min x 3 PRN Rhonda G Barrett, PA-C      . ondansetron (ZOFRAN) injection 4 mg  4 mg Intravenous Q6H PRN Troy Sine, MD      . pantoprazole (PROTONIX) EC tablet 40 mg  40 mg Oral Daily Rhonda G Barrett, PA-C   40 mg at 03/18/14 0906  . predniSONE (DELTASONE) tablet 10 mg  10 mg Oral Q breakfast Evelene Croon Barrett, PA-C   10 mg at 03/18/14 0929  . psyllium (HYDROCIL/METAMUCIL) packet 1 packet  1 packet Oral Daily Polly Cobia, RPH      . simethicone (MYLICON) chewable tablet 160 mg  160 mg Oral BID Evelene Croon Barrett, PA-C   160 mg at 03/18/14 6734  . simvastatin (ZOCOR) tablet 20 mg  20 mg Oral q1800 Rhonda G Barrett, PA-C   20 mg at 03/18/14 1708  . sodium chloride 0.9 % injection 3 mL  3 mL Intravenous Q12H Almyra Deforest, PA      . sodium  chloride 0.9 % injection 3 mL  3 mL Intravenous PRN Almyra Deforest, PA      . sulfamethoxazole-trimethoprim (BACTRIM DS) 800-160 MG per tablet 1 tablet  1 tablet Oral Daily Evelene Croon Barrett, PA-C   1 tablet at 03/18/14 1708  . tamsulosin (FLOMAX) capsule 0.4 mg  0.4 mg Oral QPM Rhonda G Barrett, PA-C   0.4 mg at 03/18/14 1708  . traMADol (ULTRAM) tablet 50 mg  50 mg Oral Q6H PRN Rhonda G Barrett, PA-C      . zolpidem (AMBIEN) tablet 5 mg  5 mg Oral QHS PRN Lonn Georgia, PA-C        PE: General appearance: alert, cooperative and no distress Lungs: clear to auscultation bilaterally Heart: regular rate and rhythm, 2/6 SM Extremities: no LEE Pulses: 2+ and symmetric Skin: warm and dry Right groin: moderate sized hematoma with ecchymosis and + for bruit. Mildly tender with palpation.  Neurologic: Grossly normal  Lab Results:   Recent Labs  03/17/14 0508 03/18/14 0300 03/19/14 0347  WBC 7.0 6.0 6.3  HGB 9.7* 8.7* 7.4*  HCT 29.4* 26.5* 22.5*  PLT 240 226 216   BMET  Recent Labs  03/17/14 1410 03/18/14 0300  NA 144 141  K 4.0 3.6*  CL 111 109  CO2 22 20  GLUCOSE 101* 127*  BUN 21 23  CREATININE 1.49* 1.38*  CALCIUM 7.9* 7.7*     Assessment/Plan  Principal Problem:   Unstable Class III Angina Active Problems:   HYPOTHYROIDISM   HYPERLIPIDEMIA   ANEMIA   Wegener's granulomatosis   Chronic respiratory failure   Abnormal nuclear stress test: Intermediate risk with moderate region of apical and inferior scar with mild superimposed ischemia; EF 40%   CAD- severe 4 V CAD- not CABG candidate   Anemia- transfused 03/16/14   Chronic renal insufficiency, stage III (moderate)   Cardiomyopathy, ischemic- EF 40% by Myoview  1. CAD: s/p successful PCI to the LAD and POBA to a chronically occluded LCX. There is high grade proximal disease to a large proximal Ramus branch as well as mid-RCA disease. Plan is for intervention once bleeding stops. Currently CP free.  2. Post Cath  Complications: Pt with moderate sized right groin hematoma with ecchymosis and femoral bruit. His Hgb has further decreased to 7.4. RN noted that his HR increased  into the 140s while standing. He felt a bit lightheaded and SOB. HR improved to the 90s sitting. No symptoms at rest. Also denies right flank and low back pain. He has 2+ DPs. Pt was instructed to remain in bed.  Discussed with MD. We will order a STAT US to r/o pseudoaneurysm.  Will delay cath until bleeding stops. D/C IV heparin. Continue Plavix.   3. Anemia: Hgb 7.4 today (8.7 yesterday). Monitor closely. Transfuse if < 7.0.     LOS: 6 days    Nour Scalise M. Ladoris Gene 03/19/2014 8:49 AM

## 2014-03-19 NOTE — Transfer of Care (Signed)
Immediate Anesthesia Transfer of Care Note  Patient: Richard [name]  Procedure(s) Performed: Procedure(s): Suture repair of femoral artery with evacuation of hematoma (Right)  Patient Location: PACU  Anesthesia Type:General  Level of Consciousness: awake, alert  and oriented  Airway & Oxygen Therapy: Patient Spontanous Breathing and Patient connected to nasal cannula oxygen  Post-op Assessment: Report given to PACU RN, Post -op Vital signs reviewed and stable and Patient moving all extremities  Post vital signs: Reviewed and stable  Complications: No apparent anesthesia complications

## 2014-03-19 NOTE — Progress Notes (Signed)
VASCULAR LAB PRELIMINARY  PRELIMINARY  PRELIMINARY  PRELIMINARY  Right groin ultrasound completed.    Preliminary report:  Three psuedoaneurysm noted in the right groin.  The neck is approximately 29mm long.  The lateral pocket is 2.7 x 1.8cm.  More medially is 3.7 x 1.8 cm and off that pocket is a small pocket measuring 1.5 x 1.5 cm.  Measurements are approximate.  Adalberto Metzgar, RVT 03/19/2014, 11:03 AM

## 2014-03-20 ENCOUNTER — Encounter (HOSPITAL_COMMUNITY): Payer: Self-pay | Admitting: Vascular Surgery

## 2014-03-20 DIAGNOSIS — D62 Acute posthemorrhagic anemia: Secondary | ICD-10-CM

## 2014-03-20 LAB — TYPE AND SCREEN
ABO/RH(D): O POS
Antibody Screen: NEGATIVE
UNIT DIVISION: 0
UNIT DIVISION: 0
UNIT DIVISION: 0
Unit division: 0
Unit division: 0
Unit division: 0
Unit division: 0
Unit division: 0

## 2014-03-20 LAB — CBC
HEMATOCRIT: 26.5 % — AB (ref 39.0–52.0)
HEMOGLOBIN: 8.9 g/dL — AB (ref 13.0–17.0)
MCH: 30.9 pg (ref 26.0–34.0)
MCHC: 33.6 g/dL (ref 30.0–36.0)
MCV: 92 fL (ref 78.0–100.0)
Platelets: 193 10*3/uL (ref 150–400)
RBC: 2.88 MIL/uL — ABNORMAL LOW (ref 4.22–5.81)
RDW: 18.5 % — AB (ref 11.5–15.5)
WBC: 6.6 10*3/uL (ref 4.0–10.5)

## 2014-03-20 LAB — HEPARIN LEVEL (UNFRACTIONATED): Heparin Unfractionated: <0.1 IU/mL — ABNORMAL LOW (ref 0.30–0.70)

## 2014-03-20 LAB — APTT: aPTT: 52 seconds — ABNORMAL HIGH (ref 24–37)

## 2014-03-20 MED ORDER — SODIUM CHLORIDE 0.9 % IV SOLN
INTRAVENOUS | Status: DC
  Administered 2014-03-21: 10:00:00 via INTRAVENOUS

## 2014-03-20 MED ORDER — SODIUM CHLORIDE 0.9 % IJ SOLN
3.0000 mL | Freq: Two times a day (BID) | INTRAMUSCULAR | Status: DC
  Administered 2014-03-20: 3 mL via INTRAVENOUS

## 2014-03-20 MED ORDER — ASPIRIN EC 81 MG PO TBEC
81.0000 mg | DELAYED_RELEASE_TABLET | Freq: Every day | ORAL | Status: DC
  Administered 2014-03-22 – 2014-03-26 (×5): 81 mg via ORAL
  Filled 2014-03-20 (×5): qty 1

## 2014-03-20 MED ORDER — SODIUM CHLORIDE 0.9 % IV SOLN: 250.0000 mL | INTRAVENOUS | Status: DC | PRN

## 2014-03-20 MED ORDER — ASPIRIN 81 MG PO CHEW
81.0000 mg | CHEWABLE_TABLET | ORAL | Status: AC
Start: 2014-03-21 — End: 2014-03-21
  Administered 2014-03-21: 81 mg via ORAL
  Filled 2014-03-20: qty 1

## 2014-03-20 MED ORDER — SODIUM CHLORIDE 0.9 % IJ SOLN: 3.0000 mL | INTRAMUSCULAR | Status: DC | PRN

## 2014-03-20 NOTE — Progress Notes (Signed)
Pt. Seen and examined. Agree with the NP/PA-C note as written.  Doing better after pseudoaneurysm repair. Appreciate Dr. Evelena Leyden assistance. No acute need to restart heparin. Would recommend complete rescularization of Ramus/RCA tomorrow. I have scheduled him for 10:30 with Dr. Angelena Form. Would still recommend Bare Metal Stent, if possible to minimize DAPT time due to his Wegener's and ongoing bleeding risk.  Pixie Casino, MD, [facility] Attending Cardiologist [place]

## 2014-03-20 NOTE — Clinical Documentation Improvement (Signed)
Possible Clinical Conditions?  Acute Blood Loss Anemia Expected Acute Blood Loss Anemia Acute on chronic blood loss anemia Other Condition Cannot Clinically Determine  Supporting Information: (AS PER NOTES)"Right femoral psuedoaneurysm: no further hematoma. JP drain with 45cc output yesterday. Minimal output today. Discontinue JP drain tomorrow. -Anemia: Hgb improved from 7.4 to 8.9 s/p 2 units yesterday "  Diagnostics: Component     Latest Ref Rng 03/17/2014 03/18/2014 03/19/2014 03/19/2014 03/20/2014           3:47 AM  3:45 PM   Hemoglobin     13.0 - 17.0 g/dL 9.7 (L) 8.7 (L) 7.4 (L) 8.9 (L) 8.9 (L)  HCT     39.0 - 52.0 % 29.4 (L) 26.5 (L) 22.5 (L) 26.4 (L) 26.5 (L)   Treatment: TRANSFUSION=RBCs 2 units yesterday   Thank You, Alessandra Grout, RN, BSN, CCDS,Clinical Documentation Specialist:  [phone]  [phone]=[facility] [place]- Health Information Management

## 2014-03-20 NOTE — Progress Notes (Signed)
  [facility] Progress Note  03/20/2014 10:27 AM 1 Day Post-Op  Subjective:  Complaining of some "stinging" in right groin, but pain is improved overall from yesterday.    Filed Vitals:   03/20/14 0734  BP: 118/56  Pulse: 89  Temp: 99.3 F (37.4 C)  Resp: 18    Physical Exam: Incisions: Right groin incision clean, dry and intact. No hematoma with surrounding ecchymosis. JP drain in place in appr. 10cc sanguinous output.  Extremities:  Palpable 2+ DP pulse  CBC    Component Value Date/Time   WBC 6.6 03/20/2014 0430   RBC 2.88* 03/20/2014 0430   RBC 2.30* 03/15/2014 0343   HGB 8.9* 03/20/2014 0430   HCT 26.5* 03/20/2014 0430   PLT 193 03/20/2014 0430   MCV 92.0 03/20/2014 0430   [name] 30.9 03/20/2014 0430   MCHC 33.6 03/20/2014 0430   RDW 18.5* 03/20/2014 0430   LYMPHSABS 0.4* 03/13/2014 1159   MONOABS 0.4 03/13/2014 1159   EOSABS 0.1 03/13/2014 1159   BASOSABS 0.0 03/13/2014 1159    BMET    Component Value Date/Time   NA 141 03/18/2014 0300   K 3.6* 03/18/2014 0300   CL 109 03/18/2014 0300   CO2 20 03/18/2014 0300   GLUCOSE 127* 03/18/2014 0300   BUN 23 03/18/2014 0300   CREATININE 1.38* 03/18/2014 0300   CALCIUM 7.7* 03/18/2014 0300   GFRNONAA 48* 03/18/2014 0300   GFRAA 56* 03/18/2014 0300    INR    Component Value Date/Time   INR 1.15 03/13/2014 1351     Intake/Output Summary (Last 24 hours) at 03/20/14 1027 Last data filed at 03/20/14 0830  Gross per 24 hour  Intake 2789.5 ml  Output   1615 ml  Net 1174.5 ml     Assessment:  78 y.o. male is s/p: Suture repair of femoral artery with evacuation of hematoma.  1 Day Post-Op  Plan: -Right femoral psuedoaneurysm: no further hematoma. JP drain with 45cc output yesterday. Minimal output today. Discontinue JP drain tomorrow.  -Anemia: Hgb improved from 7.4 to 8.9 s/p 2 units yesterday -May resume heparin. Per cardiology.  -May proceed with staged PCI using left groin per cardiology. -Transfer to floor per  cardiology.  -Will continue to follow.    Virgina Jock, PA-C [facility] Office: [phone] Pager: [phone] 03/20/2014 10:27 AM Agree with above assessment-patient examined Right proximal thigh is flat with no evidence of expanding hematoma dressing dry 3+ dorsalis pedis pulse palpable Will DC drain in the a.m. I will see patient in followup 2 weeks post discharge

## 2014-03-20 NOTE — Progress Notes (Signed)
Patient Profile: 78 y/o WM with CAD, s/p recent LHC on 03/17/14 with successful PCI to the LAD and POBA to a chronically occluded LCX. Also with high grade proximal disease to a large proximal Ramus branch as well as mid-RCA dieses (staged PCI pending). Post cath recovery complicated by right femoral pseudoaneurysm. Day 1 s/p surgical repair by Vascular Surgery.   Subjective: Chest pain free. Right groin still tender.   Objective: Vital signs in last 24 hours: Temp:  [97.6 F (36.4 C)-101.1 F (38.4 C)] 99.3 F (37.4 C) (08/13 0734) Pulse Rate:  [69-103] 89 (08/13 0734) Resp:  [15-21] 18 (08/13 0734) BP: (118-136)/(56-70) 118/56 mmHg (08/13 0734) SpO2:  [92 %-100 %] 95 % (08/13 0734) Weight:  [130 lb 1.1 oz (59 kg)] 130 lb 1.1 oz (59 kg) (08/12 1813) Last BM Date: 03/18/14  Intake/Output from previous day: 08/12 0701 - 08/13 0700 In: 2710 [P.O.:580; I.V.:1452.5; Blood:627.5; IV Piggyback:50] Out: 9675 [Urine:1025; Drains:45; Blood:440] Intake/Output this shift: Total I/O In: 120 [P.O.:120] Out: 105 [Urine:100; Drains:5]  Medications Current Facility-Administered Medications  Medication Dose Route Frequency Provider Last Rate Last Dose  . 0.9 %  sodium chloride infusion   Intravenous Continuous Troy Sine, MD 50 mL/hr at 03/18/14 1700    . 0.9 %  sodium chloride infusion   Intravenous Once Almyra Deforest, [place]      . 0.9 %  sodium chloride infusion   Intravenous Continuous Mal Misty, MD      . acetaminophen (TYLENOL) tablet 650 mg  650 mg Oral Q4H PRN Troy Sine, MD   650 mg at 03/20/14 0000  . ALPRAZolam (XANAX) tablet 0.25 mg  0.25 mg Oral BID PRN Evelene Croon Barrett, PA-C      . alum & mag hydroxide-simeth (MAALOX/MYLANTA) 200-200-20 MG/5ML suspension 15-30 mL  15-30 mL Oral Q2H PRN Alvia Grove, PA-C      . aspirin EC tablet 81 mg  81 mg Oral Daily Pixie Casino, MD      . B-complex with vitamin C tablet 1 tablet  1 tablet Oral Daily Evelene Croon Barrett, PA-C   1  tablet at 03/18/14 0929  . bisacodyl (DULCOLAX) suppository 10 mg  10 mg Rectal Daily PRN Alvia Grove, PA-C      . bisoprolol (ZEBETA) tablet 2.5 mg  2.5 mg Oral Daily Lendon Colonel, NP   2.5 mg at 03/19/14 1111  . budesonide-formoterol (SYMBICORT) 160-4.5 MCG/ACT inhaler 2 puff  2 puff Inhalation BID Evelene Croon Barrett, PA-C   2 puff at 03/19/14 0908  . cefUROXime (ZINACEF) 1.5 g in dextrose 5 % 50 mL IVPB  1.5 g Intravenous Q12H Alvia Grove, PA-C   1.5 g at 03/20/14 0416  . clopidogrel (PLAVIX) tablet 75 mg  75 mg Oral Q breakfast Troy Sine, MD   75 mg at 03/20/14 9163  . Cyclophosphamide CAPS 50 mg  50 mg Oral Q1400 Minus Breeding, MD   50 mg at 03/18/14 1303  . dextromethorphan (DELSYM) 30 MG/5ML liquid 30 mg  30 mg Oral BID PRN Rhonda G Barrett, PA-C      . dextromethorphan-guaiFENesin (MUCINEX DM) 30-600 MG per 12 hr tablet 1 tablet  1 tablet Oral BID Evelene Croon Barrett, PA-C   1 tablet at 03/19/14 2210  . docusate sodium (COLACE) capsule 100 mg  100 mg Oral Daily Alvia Grove, PA-C      . DOPamine (INTROPIN) 800 mg in dextrose 5 % 250 mL (3.2  mg/mL) infusion  3-5 mcg/kg/min Intravenous Continuous Alvia Grove, PA-C      . dutasteride (AVODART) capsule 0.5 mg  0.5 mg Oral QODAY Rhonda G Barrett, PA-C   0.5 mg at 03/18/14 2054  . labetalol (NORMODYNE,TRANDATE) injection 10 mg  10 mg Intravenous Q2H PRN Alvia Grove, PA-C       Or  . hydrALAZINE (APRESOLINE) injection 10 mg  10 mg Intravenous Q2H PRN Alvia Grove, PA-C      . levalbuterol (XOPENEX) nebulizer solution 0.63 mg  0.63 mg Inhalation Q4H PRN Evelene Croon Barrett, PA-C   0.63 mg at 03/14/14 1612  . loratadine (CLARITIN) tablet 10 mg  10 mg Oral Daily Evelene Croon Barrett, PA-C   10 mg at 03/18/14 0929  . metoprolol (LOPRESSOR) injection 2-5 mg  2-5 mg Intravenous Q2H PRN Alvia Grove, PA-C      . morphine 2 MG/ML injection 2-4 mg  2-4 mg Intravenous Q1H PRN Alvia Grove, PA-C   2 mg at 03/19/14 2014  .  nitroGLYCERIN (NITROGLYN) 2 % ointment 1 inch  1 inch Topical 4 times per day Rogelia Mire, NP   1 inch at 03/19/14 2345  . nitroGLYCERIN (NITROSTAT) SL tablet 0.4 mg  0.4 mg Sublingual Q5 Min x 3 PRN Evelene Croon Barrett, PA-C      . ondansetron (ZOFRAN) injection 4 mg  4 mg Intravenous Q6H PRN Troy Sine, MD      . oxyCODONE (Oxy IR/ROXICODONE) immediate release tablet 5-10 mg  5-10 mg Oral Q4H PRN Alvia Grove, PA-C   10 mg at 03/20/14 4401  . pantoprazole (PROTONIX) EC tablet 40 mg  40 mg Oral Daily Evelene Croon Barrett, PA-C   40 mg at 03/19/14 2210  . phenol (CHLORASEPTIC) mouth spray 1 spray  1 spray Mouth/Throat PRN Alvia Grove, PA-C      . potassium chloride SA (K-DUR,KLOR-CON) CR tablet 20-40 mEq  20-40 mEq Oral Daily PRN Alvia Grove, PA-C      . predniSONE (DELTASONE) tablet 10 mg  10 mg Oral Q breakfast Evelene Croon Barrett, PA-C   10 mg at 03/20/14 0908  . psyllium (HYDROCIL/METAMUCIL) packet 1 packet  1 packet Oral Daily Polly Cobia, RPH      . simethicone (MYLICON) chewable tablet 160 mg  160 mg Oral BID Evelene Croon Barrett, PA-C   160 mg at 03/20/14 0909  . simvastatin (ZOCOR) tablet 20 mg  20 mg Oral q1800 Rhonda G Barrett, PA-C   20 mg at 03/18/14 1708  . sulfamethoxazole-trimethoprim (BACTRIM DS) 800-160 MG per tablet 1 tablet  1 tablet Oral Daily Evelene Croon Barrett, PA-C   1 tablet at 03/18/14 1708  . tamsulosin (FLOMAX) capsule 0.4 mg  0.4 mg Oral QPM Rhonda G Barrett, PA-C   0.4 mg at 03/18/14 1708  . traMADol (ULTRAM) tablet 50 mg  50 mg Oral Q6H PRN Rhonda G Barrett, PA-C      . zolpidem (AMBIEN) tablet 5 mg  5 mg Oral QHS PRN Lonn Georgia, PA-C        PE: General appearance: alert, cooperative and no distress Lungs: clear to auscultation bilaterally Heart: regular rate and rhythm and 2/6 SM Extremities: no LEE Pulses: 2+ and symmetric Skin: warm and dry Neurologic: Grossly normal  Lab Results:   Recent Labs  03/19/14 0347 03/19/14 1545  03/20/14 0430  WBC 6.3 5.5 6.6  HGB 7.4* 8.9* 8.9*  HCT 22.5* 26.4* 26.5*  PLT 216 181 193   BMET  Recent Labs  03/17/14 1410 03/18/14 0300  NA 144 141  K 4.0 3.6*  CL 111 109  CO2 22 20  GLUCOSE 101* 127*  BUN 21 23  CREATININE 1.49* 1.38*  CALCIUM 7.9* 7.7*    Studies/Results: Right Groin Ultrasound 03/19/14 Preliminary report: Three psuedoaneurysm noted in the right groin. The neck is approximately 25mm long. The lateral pocket is 2.7 x 1.8cm. More medially is 3.7 x 1.8 cm and off that pocket is a small pocket measuring 1.5 x 1.5 cm. Measurements are approximate.  Assessment/Plan  Principal Problem:   Unstable Class III Angina Active Problems:   HYPOTHYROIDISM   HYPERLIPIDEMIA   ANEMIA   Wegener's granulomatosis   Chronic respiratory failure   Abnormal nuclear stress test: Intermediate risk with moderate region of apical and inferior scar with mild superimposed ischemia; EF 40%   CAD- severe 4 V CAD- not CABG candidate   Anemia- transfused 03/16/14   Chronic renal insufficiency, stage III (moderate)   Cardiomyopathy, ischemic- EF 40% by Myoview  1. CAD: s/p successful PCI to the LAD and POBA to a chronically occluded LCX. There remains high grade proximal disease to a large proximal Ramus branch as well as mid-RCA disease. Plan for staged PCI once he recovers from a vascular standpoint. Per Vascular PA, he can resume IV heparin if needed. Will defer to Dr Debara Pickett.   2. Right femoral pseudoaneurysm: Day 1 s/p suture repair of femoral artery with evacuation of hematoma  3. Anemia: subsequent to right femoral pseudoaneurysm. Post surgical repair. S/p blood transfusion x 2. Hgb improved from 7.4 to 8.9. Hgb remains stable. There have been no serial decreases since yesterday. BP and HR both stable.    LOS: 7 days    Cassady Turano M. Ladoris Gene 03/20/2014 9:47 AM

## 2014-03-21 ENCOUNTER — Telehealth: Payer: Self-pay | Admitting: Vascular Surgery

## 2014-03-21 ENCOUNTER — Encounter (HOSPITAL_COMMUNITY)
Admission: EM | Disposition: A | Discharge status: Home / Self Care | Payer: Self-pay | Source: Home / Self Care | Attending: Internal Medicine

## 2014-03-21 DIAGNOSIS — I2510 Atherosclerotic heart disease of native coronary artery without angina pectoris: Secondary | ICD-10-CM

## 2014-03-21 HISTORY — PX: PERCUTANEOUS CORONARY STENT INTERVENTION (PCI-S): SHX5485

## 2014-03-21 LAB — BASIC METABOLIC PANEL
ANION GAP: 14 (ref 5–15)
BUN: 20 mg/dL (ref 6–23)
CO2: 20 mEq/L (ref 19–32)
Calcium: 7.7 mg/dL — ABNORMAL LOW (ref 8.4–10.5)
Chloride: 110 mEq/L (ref 96–112)
Creatinine, Ser: 1.46 mg/dL — ABNORMAL HIGH (ref 0.50–1.35)
GFR calc non Af Amer: 45 mL/min — ABNORMAL LOW (ref >90)
GFR, EST AFRICAN AMERICAN: 52 mL/min — AB (ref >90)
Glucose, Bld: 91 mg/dL (ref 70–99)
POTASSIUM: 3.7 meq/L (ref 3.7–5.3)
Sodium: 144 mEq/L (ref 137–147)

## 2014-03-21 LAB — CBC
HCT: 25.7 % — ABNORMAL LOW (ref 39.0–52.0)
Hemoglobin: 8.7 g/dL — ABNORMAL LOW (ref 13.0–17.0)
MCH: 31.9 pg (ref 26.0–34.0)
MCHC: 33.9 g/dL (ref 30.0–36.0)
MCV: 94.1 fL (ref 78.0–100.0)
Platelets: 184 10*3/uL (ref 150–400)
RBC: 2.73 MIL/uL — ABNORMAL LOW (ref 4.22–5.81)
RDW: 17.9 % — AB (ref 11.5–15.5)
WBC: 5.5 10*3/uL (ref 4.0–10.5)

## 2014-03-21 LAB — MRSA PCR SCREENING: MRSA by PCR: NEGATIVE

## 2014-03-21 LAB — POCT ACTIVATED CLOTTING TIME: Activated Clotting Time: 478 seconds

## 2014-03-21 SURGERY — PERCUTANEOUS CORONARY STENT INTERVENTION (PCI-S)
Anesthesia: LOCAL

## 2014-03-21 MED ORDER — SODIUM CHLORIDE 0.9 % IV SOLN: INTRAVENOUS | Status: AC

## 2014-03-21 MED ORDER — HEPARIN (PORCINE) IN NACL 2-0.9 UNIT/ML-% IJ SOLN
INTRAMUSCULAR | Status: AC
  Filled 2014-03-21: qty 1500

## 2014-03-21 MED ORDER — FENTANYL CITRATE 0.05 MG/ML IJ SOLN
INTRAMUSCULAR | Status: AC
  Filled 2014-03-21: qty 2

## 2014-03-21 MED ORDER — LIDOCAINE HCL (PF) 1 % IJ SOLN
INTRAMUSCULAR | Status: AC
  Filled 2014-03-21: qty 30

## 2014-03-21 MED ORDER — NITROGLYCERIN IN D5W 200-5 MCG/ML-% IV SOLN
INTRAVENOUS | Status: AC
  Filled 2014-03-21: qty 250

## 2014-03-21 MED ORDER — NITROGLYCERIN 1 MG/10 ML FOR IR/CATH LAB
INTRA_ARTERIAL | Status: AC
  Filled 2014-03-21: qty 10

## 2014-03-21 MED ORDER — SODIUM CHLORIDE 0.9 % IV SOLN
1.7500 mg/kg/h | INTRAVENOUS | Status: AC
  Administered 2014-03-21: 1.75 mg/kg/h via INTRAVENOUS

## 2014-03-21 MED ORDER — BIVALIRUDIN BOLUS VIA INFUSION: 0.1000 mg/kg | Freq: Once | INTRAVENOUS | Status: DC

## 2014-03-21 MED ORDER — VERAPAMIL HCL 2.5 MG/ML IV SOLN
INTRAVENOUS | Status: AC
  Filled 2014-03-21: qty 4

## 2014-03-21 MED ORDER — VERAPAMIL HCL 2.5 MG/ML IV SOLN
INTRAVENOUS | Status: AC
  Filled 2014-03-21: qty 2

## 2014-03-21 MED ORDER — HEPARIN SODIUM (PORCINE) 1000 UNIT/ML IJ SOLN
INTRAMUSCULAR | Status: AC
  Filled 2014-03-21: qty 1

## 2014-03-21 MED ORDER — MIDAZOLAM HCL 2 MG/2ML IJ SOLN
INTRAMUSCULAR | Status: AC
  Filled 2014-03-21: qty 2

## 2014-03-21 MED ORDER — SODIUM CHLORIDE 0.9 % IV SOLN
1.7500 mg/kg/h | INTRAVENOUS | Status: AC
  Administered 2014-03-21: 1.75 mg/kg/h via INTRAVENOUS
  Filled 2014-03-21: qty 250

## 2014-03-21 NOTE — Plan of Care (Signed)
Problem: Phase II Progression Outcomes Goal: Pain controlled with appropriate interventions Outcome: Progressing Patient denies pain. Goal: Hemodynamically stable Outcome: Progressing Vitals stable. NSR in the 90s. BP within defined limits. Denies chest pain. Denies pain in groin sites. Groin sites have dressings intact bilaterally. C/D/I. No change in site assessments.

## 2014-03-21 NOTE — Telephone Encounter (Addendum)
Message copied by Doristine Section on Fri Mar 21, 2014 10:44 AM ------      Message from: Peter Minium K      Created: Thu Mar 20, 2014  4:10 PM      Regarding: Schedule                   ----- Message -----         From: Alvia Grove, PA-C         Sent: 03/20/2014   3:46 PM           To: [facility] Charge Pool            S/p Suture repair of femoral artery with evacuation of hematoma 03/20/14            F/u with Dr. Kellie Simmering in 2 weeks.            Thanks,      Kim ------  notified patient of post op appt. on 04-08-14 3:45 with dr. Kellie Simmering

## 2014-03-21 NOTE — CV Procedure (Signed)
Cardiac Catheterization Operative Report  Richard [name] [id number] 8/14/201510:50 AM [name], MD  Procedure Performed:  1. PTCA/bare metal stent x 1 mid RCA 2. Rotablator atherectomy RCA 3. Placement temporary transvenous pacemaker  Operator: Lauree Chandler, MD  Arterial access site:  Right radial artery.   Indication: 78 yo male with history of CAD, cardiomyopathy here today for staged PCI of the RCA and Ramus Intermediate branch. He was found to have multi-vessel CAD by Dr. Ellyn Hack last week. He was not felt to be a good candidate for bypass and was turned down by CT surgery. LAD treated with bare metal stent 03/17/14 by Dr. Claiborne Billings. Circumflex treated with POBA 03/17/14 by Dr. Claiborne Billings. The plan per the primary team has been for staged PCI of the RCA and Ramus.                                       Procedure Details: The risks, benefits, complications, treatment options, and expected outcomes were discussed with the patient. The patient and/or family concurred with the proposed plan, giving informed consent. The patient was brought to the cath lab after IV hydration was begun and oral premedication was given. The patient was further sedated with Versed and Fentanyl. The right wrist was assessed with a reverse Allens test which was positive. The right wrist was prepped and draped in a sterile fashion. 1% lidocaine was used for local anesthesia. Using the modified Seldinger access technique, a 5 French sheath was placed in the right radial artery. 3 mg Verapamil was given through the sheath. He was given a bolus of Angiomax and a drip was started. I then engaged the RCA with a JR4 guiding catheter. I had a difficult time crossing the tandem mid stenoses with a wire. I then placed a 2.0 x 12 OTW balloon in the mid RCA and attempted to cross with a Fielder XT wire. This wire would not cross. I then was able to get a Whisper wire across the stenosis but the balloon would not pass  over the wire beyond the stenosis. I inflated the 2.0 mm balloon in the more proximal stenosis but the anatomy would not allow me to pass the balloon further. I could not pass a 1.5 mm balloon across but tried to put the nose of the balloon in the lesion and inflated x 2. I then tried to use a FineCross to cross the lesion. This would not cross so I passed a Rotafloppy wire across the lesion and removed the FineCross. We then set up for an ad hoc rotablator. The left groin was prepped and draped in a sterile fashion and a 6 French sheath was placed in the left femoral vein. I then passed a temporary pacing wire into the RV. The rotablator was then placed on the table. I made three passes with a 1.25 mm burr. I then inflated a 2.0 x 12 mm balloon x 3 in the mid vessel. A 2.5 x 28 mm Mine Trek bare metal stent was deployed in the mid vessel. The stent was post-dilated with a 2.75 x 20 mm Muldrow balloon x 2. The stenosis was taken from 99% down to 0%. The sheath was removed from the right radial artery and a Terumo hemostasis band was applied at the arteriotomy site on the right wrist.   There were no immediate complications. The patient was taken to  the recovery area in stable condition.   Hemodynamic Findings: Central aortic pressure: 124/69  Impression: 1. Unstable angina with ongoing dyspnea and chest pain following PCI of the LAD and OM.  2. Successful rotablator atherectomy, placement bare metal stent mid RCA 3. Moderate stenosis proximal ramus intermediate branch  Recommendations: Continue ASA and Plavix for at least one month. I had used 40 minutes of fluoro time and a large volume of contrast. I did not perform angiography of the intermediate branch which appeared to have moderate stenosis on the diagnostic films from last week. Would favor medical management of intermediate branch for now and if he has recurrent angina, consider PCI of intermediate branch then.   Complex prolonged procedure due to  calcification of vessel, severe stenoses and inability to pass balloons across the stenosis.        Complications:  None. The patient tolerated the procedure well.

## 2014-03-21 NOTE — Interval H&P Note (Signed)
History and Physical Interval Note:  03/21/2014 10:41 AM  Richard [name]  has presented today for PCI of the RCA and Ramus with the diagnosis of cp  The various methods of treatment have been discussed with the patient and family. After consideration of risks, benefits and other options for treatment, the patient has consented to  Procedure(s): PERCUTANEOUS CORONARY STENT INTERVENTION (PCI-S) (N/A) as a surgical intervention .  The patient's history has been reviewed, patient examined, no change in status, stable for surgery.  I have reviewed the patient's chart and labs.  Questions were answered to the patient's satisfaction.    Cath Lab Visit (complete for each Cath Lab visit)  Clinical Evaluation Leading to the Procedure:   ACS: No.  Non-ACS:    Anginal Classification: CCS III  Anti-ischemic medical therapy: Minimal Therapy (1 class of medications)  Non-Invasive Test Results: No non-invasive testing performed  Prior CABG: No previous CABG        Richard [name]

## 2014-03-21 NOTE — Progress Notes (Addendum)
  [facility] Progress Note  03/21/2014 8:11 AM 2 Days Post-Op  Subjective:  No complaints this am.    Filed Vitals:   03/21/14 0746  BP:   Pulse:   Temp: 98.6 F (37 C)  Resp:     Physical Exam: Incisions:  Right groin incision clean dry and intact. Dressing is dry. No hematoma. JP drain in place with minimal sanguinous output.  Extremities:  Palpable 2+ right DP pulse.   CBC    Component Value Date/Time   WBC 5.5 03/21/2014 0505   RBC 2.73* 03/21/2014 0505   RBC 2.30* 03/15/2014 0343   HGB 8.7* 03/21/2014 0505   HCT 25.7* 03/21/2014 0505   PLT 184 03/21/2014 0505   MCV 94.1 03/21/2014 0505   [name] 31.9 03/21/2014 0505   MCHC 33.9 03/21/2014 0505   RDW 17.9* 03/21/2014 0505   LYMPHSABS 0.4* 03/13/2014 1159   MONOABS 0.4 03/13/2014 1159   EOSABS 0.1 03/13/2014 1159   BASOSABS 0.0 03/13/2014 1159    BMET    Component Value Date/Time   NA 144 03/21/2014 0505   K 3.7 03/21/2014 0505   CL 110 03/21/2014 0505   CO2 20 03/21/2014 0505   GLUCOSE 91 03/21/2014 0505   BUN 20 03/21/2014 0505   CREATININE 1.46* 03/21/2014 0505   CALCIUM 7.7* 03/21/2014 0505   GFRNONAA 45* 03/21/2014 0505   GFRAA 52* 03/21/2014 0505    INR    Component Value Date/Time   INR 1.15 03/13/2014 1351     Intake/Output Summary (Last 24 hours) at 03/21/14 0811 Last data filed at 03/21/14 0424  Gross per 24 hour  Intake    740 ml  Output   1350 ml  Net   -610 ml     Assessment:  78 y.o. male is s/p:  Suture repair of femoral artery with evacuation of hematoma.  2 Days Post-Op  Plan: -No hematoma of right groin.  -Palpable 2+ right DP pulse -Acute surgical blood loss anemia: Hgb stable.  -Discontinue JP drain.  -Follow-up in 2 weeks with Dr. Kellie Simmering.  -Will sign off. Call with questions.    Virgina Jock, PA-C [facility] Office: [phone] Pager: [phone] 03/21/2014 8:11 AM

## 2014-03-21 NOTE — Progress Notes (Signed)
Jackson pratt to right thigh removed tolerated well, dressing applied. Continue to monitor.

## 2014-03-21 NOTE — Progress Notes (Addendum)
Transferred-in from the cath lab by bed awake and alert. TR band to right wrist, site noted blackish and puffy, pressure applied for 15 min by cath lab staff, subsided and soft. Elevated with pillow. Instructed to avoid moving right arm and pulse ox to right thumb applied. Arm warm to touch and pulses palpable.

## 2014-03-21 NOTE — Progress Notes (Signed)
Nutrition Brief Note  Patient identified on the Malnutrition Screening Tool (MST) Report for weight loss. No significant weight loss noted over the past 4 months.   Wt Readings from Last 15 Encounters:  03/20/14 127 lb 13.9 oz (58 kg)  03/20/14 127 lb 13.9 oz (58 kg)  03/20/14 127 lb 13.9 oz (58 kg)  03/20/14 127 lb 13.9 oz (58 kg)  03/20/14 127 lb 13.9 oz (58 kg)  03/20/14 127 lb 13.9 oz (58 kg)  03/20/14 127 lb 13.9 oz (58 kg)  03/12/14 124 lb (56.246 kg)  03/05/14 124 lb 4.8 oz (56.382 kg)  02/18/14 124 lb (56.246 kg)  02/12/14 123 lb (55.792 kg)  01/09/14 124 lb (56.246 kg)  12/23/13 127 lb (57.607 kg)  12/12/13 126 lb 3.2 oz (57.244 kg)  11/18/13 126 lb (57.153 kg)    Body mass index is 20.02 kg/(m^2). Patient meets criteria for normal weight based on current BMI.   Current diet order is NPO for a procedure, patient has been consuming approximately 60-100% of heart healthy meals since admission. Labs and medications reviewed.   No nutrition interventions warranted at this time. If nutrition issues arise, please consult RD.    Molli Barrows, RD, LDN, [place] Pager [phone] After Hours Pager [phone]

## 2014-03-21 NOTE — Progress Notes (Signed)
Left groin sheath removed, tolerated well. Manual pressure applied for 20 min and pressure dressing applied , cont. To monitor

## 2014-03-21 NOTE — H&P (View-Only) (Signed)
Patient Profile: 78 y/o WM with CAD, s/p recent LHC on 03/17/14 with successful PCI to the LAD and POBA to a chronically occluded LCX. Also with high grade proximal disease to a large proximal Ramus branch as well as mid-RCA dieses (staged PCI pending). Post cath recovery complicated by right femoral pseudoaneurysm. Day 1 s/p surgical repair by Vascular Surgery.   Subjective: Chest pain free. Right groin still tender.   Objective: Vital signs in last 24 hours: Temp:  [97.6 F (36.4 C)-101.1 F (38.4 C)] 99.3 F (37.4 C) (08/13 0734) Pulse Rate:  [69-103] 89 (08/13 0734) Resp:  [15-21] 18 (08/13 0734) BP: (118-136)/(56-70) 118/56 mmHg (08/13 0734) SpO2:  [92 %-100 %] 95 % (08/13 0734) Weight:  [130 lb 1.1 oz (59 kg)] 130 lb 1.1 oz (59 kg) (08/12 1813) Last BM Date: 03/18/14  Intake/Output from previous day: 08/12 0701 - 08/13 0700 In: 2710 [P.O.:580; I.V.:1452.5; Blood:627.5; IV Piggyback:50] Out: 4008 [Urine:1025; Drains:45; Blood:440] Intake/Output this shift: Total I/O In: 120 [P.O.:120] Out: 105 [Urine:100; Drains:5]  Medications Current Facility-Administered Medications  Medication Dose Route Frequency Provider Last Rate Last Dose  . 0.9 %  sodium chloride infusion   Intravenous Continuous Troy Sine, MD 50 mL/hr at 03/18/14 1700    . 0.9 %  sodium chloride infusion   Intravenous Once Almyra Deforest, [place]      . 0.9 %  sodium chloride infusion   Intravenous Continuous Mal Misty, MD      . acetaminophen (TYLENOL) tablet 650 mg  650 mg Oral Q4H PRN Troy Sine, MD   650 mg at 03/20/14 0000  . ALPRAZolam (XANAX) tablet 0.25 mg  0.25 mg Oral BID PRN Evelene Croon Barrett, PA-C      . alum & mag hydroxide-simeth (MAALOX/MYLANTA) 200-200-20 MG/5ML suspension 15-30 mL  15-30 mL Oral Q2H PRN Alvia Grove, PA-C      . aspirin EC tablet 81 mg  81 mg Oral Daily Pixie Casino, MD      . B-complex with vitamin C tablet 1 tablet  1 tablet Oral Daily Evelene Croon Barrett, PA-C   1  tablet at 03/18/14 0929  . bisacodyl (DULCOLAX) suppository 10 mg  10 mg Rectal Daily PRN Alvia Grove, PA-C      . bisoprolol (ZEBETA) tablet 2.5 mg  2.5 mg Oral Daily Lendon Colonel, NP   2.5 mg at 03/19/14 1111  . budesonide-formoterol (SYMBICORT) 160-4.5 MCG/ACT inhaler 2 puff  2 puff Inhalation BID Evelene Croon Barrett, PA-C   2 puff at 03/19/14 0908  . cefUROXime (ZINACEF) 1.5 g in dextrose 5 % 50 mL IVPB  1.5 g Intravenous Q12H Alvia Grove, PA-C   1.5 g at 03/20/14 0416  . clopidogrel (PLAVIX) tablet 75 mg  75 mg Oral Q breakfast Troy Sine, MD   75 mg at 03/20/14 6761  . Cyclophosphamide CAPS 50 mg  50 mg Oral Q1400 Minus Breeding, MD   50 mg at 03/18/14 1303  . dextromethorphan (DELSYM) 30 MG/5ML liquid 30 mg  30 mg Oral BID PRN Rhonda G Barrett, PA-C      . dextromethorphan-guaiFENesin (MUCINEX DM) 30-600 MG per 12 hr tablet 1 tablet  1 tablet Oral BID Evelene Croon Barrett, PA-C   1 tablet at 03/19/14 2210  . docusate sodium (COLACE) capsule 100 mg  100 mg Oral Daily Alvia Grove, PA-C      . DOPamine (INTROPIN) 800 mg in dextrose 5 % 250 mL (3.2  mg/mL) infusion  3-5 mcg/kg/min Intravenous Continuous Alvia Grove, PA-C      . dutasteride (AVODART) capsule 0.5 mg  0.5 mg Oral QODAY Rhonda G Barrett, PA-C   0.5 mg at 03/18/14 2054  . labetalol (NORMODYNE,TRANDATE) injection 10 mg  10 mg Intravenous Q2H PRN Alvia Grove, PA-C       Or  . hydrALAZINE (APRESOLINE) injection 10 mg  10 mg Intravenous Q2H PRN Alvia Grove, PA-C      . levalbuterol (XOPENEX) nebulizer solution 0.63 mg  0.63 mg Inhalation Q4H PRN Evelene Croon Barrett, PA-C   0.63 mg at 03/14/14 1612  . loratadine (CLARITIN) tablet 10 mg  10 mg Oral Daily Evelene Croon Barrett, PA-C   10 mg at 03/18/14 0929  . metoprolol (LOPRESSOR) injection 2-5 mg  2-5 mg Intravenous Q2H PRN Alvia Grove, PA-C      . morphine 2 MG/ML injection 2-4 mg  2-4 mg Intravenous Q1H PRN Alvia Grove, PA-C   2 mg at 03/19/14 2014  .  nitroGLYCERIN (NITROGLYN) 2 % ointment 1 inch  1 inch Topical 4 times per day Rogelia Mire, NP   1 inch at 03/19/14 2345  . nitroGLYCERIN (NITROSTAT) SL tablet 0.4 mg  0.4 mg Sublingual Q5 Min x 3 PRN Evelene Croon Barrett, PA-C      . ondansetron (ZOFRAN) injection 4 mg  4 mg Intravenous Q6H PRN Troy Sine, MD      . oxyCODONE (Oxy IR/ROXICODONE) immediate release tablet 5-10 mg  5-10 mg Oral Q4H PRN Alvia Grove, PA-C   10 mg at 03/20/14 2706  . pantoprazole (PROTONIX) EC tablet 40 mg  40 mg Oral Daily Evelene Croon Barrett, PA-C   40 mg at 03/19/14 2210  . phenol (CHLORASEPTIC) mouth spray 1 spray  1 spray Mouth/Throat PRN Alvia Grove, PA-C      . potassium chloride SA (K-DUR,KLOR-CON) CR tablet 20-40 mEq  20-40 mEq Oral Daily PRN Alvia Grove, PA-C      . predniSONE (DELTASONE) tablet 10 mg  10 mg Oral Q breakfast Evelene Croon Barrett, PA-C   10 mg at 03/20/14 0908  . psyllium (HYDROCIL/METAMUCIL) packet 1 packet  1 packet Oral Daily Polly Cobia, RPH      . simethicone (MYLICON) chewable tablet 160 mg  160 mg Oral BID Evelene Croon Barrett, PA-C   160 mg at 03/20/14 0909  . simvastatin (ZOCOR) tablet 20 mg  20 mg Oral q1800 Rhonda G Barrett, PA-C   20 mg at 03/18/14 1708  . sulfamethoxazole-trimethoprim (BACTRIM DS) 800-160 MG per tablet 1 tablet  1 tablet Oral Daily Evelene Croon Barrett, PA-C   1 tablet at 03/18/14 1708  . tamsulosin (FLOMAX) capsule 0.4 mg  0.4 mg Oral QPM Rhonda G Barrett, PA-C   0.4 mg at 03/18/14 1708  . traMADol (ULTRAM) tablet 50 mg  50 mg Oral Q6H PRN Rhonda G Barrett, PA-C      . zolpidem (AMBIEN) tablet 5 mg  5 mg Oral QHS PRN Lonn Georgia, PA-C        PE: General appearance: alert, cooperative and no distress Lungs: clear to auscultation bilaterally Heart: regular rate and rhythm and 2/6 SM Extremities: no LEE Pulses: 2+ and symmetric Skin: warm and dry Neurologic: Grossly normal  Lab Results:   Recent Labs  03/19/14 0347 03/19/14 1545  03/20/14 0430  WBC 6.3 5.5 6.6  HGB 7.4* 8.9* 8.9*  HCT 22.5* 26.4* 26.5*  PLT 216 181 193   BMET  Recent Labs  03/17/14 1410 03/18/14 0300  NA 144 141  K 4.0 3.6*  CL 111 109  CO2 22 20  GLUCOSE 101* 127*  BUN 21 23  CREATININE 1.49* 1.38*  CALCIUM 7.9* 7.7*    Studies/Results: Right Groin Ultrasound 03/19/14 Preliminary report: Three psuedoaneurysm noted in the right groin. The neck is approximately 20mm long. The lateral pocket is 2.7 x 1.8cm. More medially is 3.7 x 1.8 cm and off that pocket is a small pocket measuring 1.5 x 1.5 cm. Measurements are approximate.  Assessment/Plan  Principal Problem:   Unstable Class III Angina Active Problems:   HYPOTHYROIDISM   HYPERLIPIDEMIA   ANEMIA   Wegener's granulomatosis   Chronic respiratory failure   Abnormal nuclear stress test: Intermediate risk with moderate region of apical and inferior scar with mild superimposed ischemia; EF 40%   CAD- severe 4 V CAD- not CABG candidate   Anemia- transfused 03/16/14   Chronic renal insufficiency, stage III (moderate)   Cardiomyopathy, ischemic- EF 40% by Myoview  1. CAD: s/p successful PCI to the LAD and POBA to a chronically occluded LCX. There remains high grade proximal disease to a large proximal Ramus branch as well as mid-RCA disease. Plan for staged PCI once he recovers from a vascular standpoint. Per Vascular PA, he can resume IV heparin if needed. Will defer to Dr Debara Pickett.   2. Right femoral pseudoaneurysm: Day 1 s/p suture repair of femoral artery with evacuation of hematoma  3. Anemia: subsequent to right femoral pseudoaneurysm. Post surgical repair. S/p blood transfusion x 2. Hgb improved from 7.4 to 8.9. Hgb remains stable. There have been no serial decreases since yesterday. BP and HR both stable.    LOS: 7 days    Brittainy M. Ladoris Gene 03/20/2014 9:47 AM

## 2014-03-21 NOTE — H&P (View-Only) (Signed)
Pt. Seen and examined. Agree with the NP/PA-C note as written.  Doing better after pseudoaneurysm repair. Appreciate Dr. Evelena Leyden assistance. No acute need to restart heparin. Would recommend complete rescularization of Ramus/RCA tomorrow. I have scheduled him for 10:30 with Dr. Angelena Form. Would still recommend Bare Metal Stent, if possible to minimize DAPT time due to his Wegener's and ongoing bleeding risk.  Pixie Casino, MD, [facility] Attending Cardiologist [place]

## 2014-03-22 ENCOUNTER — Inpatient Hospital Stay (HOSPITAL_COMMUNITY): Payer: Medicare Other

## 2014-03-22 LAB — BASIC METABOLIC PANEL
ANION GAP: 14 (ref 5–15)
BUN: 22 mg/dL (ref 6–23)
CO2: 20 mEq/L (ref 19–32)
CREATININE: 1.42 mg/dL — AB (ref 0.50–1.35)
Calcium: 8 mg/dL — ABNORMAL LOW (ref 8.4–10.5)
Chloride: 108 mEq/L (ref 96–112)
GFR calc non Af Amer: 47 mL/min — ABNORMAL LOW (ref >90)
GFR, EST AFRICAN AMERICAN: 54 mL/min — AB (ref >90)
Glucose, Bld: 99 mg/dL (ref 70–99)
Potassium: 3.9 mEq/L (ref 3.7–5.3)
Sodium: 142 mEq/L (ref 137–147)

## 2014-03-22 LAB — CBC
HCT: 25.6 % — ABNORMAL LOW (ref 39.0–52.0)
Hemoglobin: 8.6 g/dL — ABNORMAL LOW (ref 13.0–17.0)
MCH: 31.3 pg (ref 26.0–34.0)
MCHC: 33.6 g/dL (ref 30.0–36.0)
MCV: 93.1 fL (ref 78.0–100.0)
PLATELETS: 215 10*3/uL (ref 150–400)
RBC: 2.75 MIL/uL — ABNORMAL LOW (ref 4.22–5.81)
RDW: 17.8 % — AB (ref 11.5–15.5)
WBC: 8.3 10*3/uL (ref 4.0–10.5)

## 2014-03-22 LAB — PRO B NATRIURETIC PEPTIDE: Pro B Natriuretic peptide (BNP): 4937 pg/mL — ABNORMAL HIGH (ref 0–450)

## 2014-03-22 LAB — D-DIMER, QUANTITATIVE: D-Dimer, Quant: 2.61 ug/mL-FEU — ABNORMAL HIGH (ref 0.00–0.48)

## 2014-03-22 NOTE — Progress Notes (Signed)
Per nurse patient is being ruled out for a PE.  We decided getting up to the chair would be enough for today.  Discussed if PE ruled out to begin ambulation with rolling walker and assistance x1 over the weekend.  He tolerated getting up to the chair well without SOB and required assistance x1.   [id number] Rosebud Poles RN

## 2014-03-22 NOTE — Progress Notes (Signed)
Dr. Alecia Lemming notified of lab results DDymer 2.61 and PBNP 4937.  Will continue to monitor pt closely.

## 2014-03-22 NOTE — Progress Notes (Signed)
   Daily Progress Note  Assessment/Planning: POD #3 s/p repair of R PSA   JP out with no reaccumulation of hematoma  Available as needed  Pt can follow up with Dr. Kellie Simmering in 2 weeks [phone])  Subjective  - 1 Day Post-Op  No complaints, got 2nd stage of his cardiac procedures yesterday  Objective Filed Vitals:   03/22/14 0400 03/22/14 0600 03/22/14 0815 03/22/14 0819  BP: 126/65 129/64 153/78   Pulse: 93 100 104   Temp:    99.4 F (37.4 C)  TempSrc:      Resp: 19 22 24    Height:      Weight:      SpO2: 95% 95% 97% 95%    Intake/Output Summary (Last 24 hours) at 03/22/14 0842 Last data filed at 03/22/14 0400  Gross per 24 hour  Intake    780 ml  Output    630 ml  Net    150 ml   VASC  R groin: inc c/d/i, echymotic, no reaccum. hematoma evident  Laboratory CBC    Component Value Date/Time   WBC 8.3 03/22/2014 0444   HGB 8.6* 03/22/2014 0444   HCT 25.6* 03/22/2014 0444   PLT 215 03/22/2014 0444    BMET    Component Value Date/Time   NA 142 03/22/2014 0444   K 3.9 03/22/2014 0444   CL 108 03/22/2014 0444   CO2 20 03/22/2014 0444   GLUCOSE 99 03/22/2014 0444   BUN 22 03/22/2014 0444   CREATININE 1.42* 03/22/2014 0444   CALCIUM 8.0* 03/22/2014 0444   GFRNONAA 47* 03/22/2014 [place]* 03/22/2014 [place], MD [facility] Office: [phone] Pager: [phone]  03/22/2014, 8:42 AM

## 2014-03-22 NOTE — Progress Notes (Addendum)
SUBJECTIVE:   Complains of SOB this am  OBJECTIVE:   Vitals:   Filed Vitals:   03/22/14 0600 03/22/14 0800 03/22/14 0815 03/22/14 0819  BP: 129/64  153/78   Pulse: 100 96 104   Temp:    99.4 F (37.4 C)  TempSrc:      Resp: 22 19 24    Height:      Weight:      SpO2: 95% 96% 97% 95%   I&O's:   Intake/Output Summary (Last 24 hours) at 03/22/14 8756 Last data filed at 03/22/14 0400  Gross per 24 hour  Intake    780 ml  Output    630 ml  Net    150 ml   TELEMETRY: Reviewed telemetry pt in sinus tachycardia     PHYSICAL EXAM General: Well developed, well nourished, in no acute distress Head: Eyes PERRLA, No xanthomas.   Normal cephalic and atramatic  Lungs:   Scattered rhonchi Heart:   HRRR and tachy S1 S2 Pulses are 2+ & equal. Abdomen: Bowel sounds are positive, abdomen soft and non-tender without masses Extremities:  No clubbing, cyanosis or edema.  DP +1 Neuro: Alert and oriented X 3. Psych:  Good affect, responds appropriately   LABS: Basic Metabolic Panel:  Recent Labs  03/21/14 0505 03/22/14 0444  NA 144 142  K 3.7 3.9  CL 110 108  CO2 20 20  GLUCOSE 91 99  BUN 20 22  CREATININE 1.46* 1.42*  CALCIUM 7.7* 8.0*   Liver Function Tests: No results found for this basename: AST, ALT, ALKPHOS, BILITOT, PROT, ALBUMIN,  in the last 72 hours No results found for this basename: LIPASE, AMYLASE,  in the last 72 hours CBC:  Recent Labs  03/21/14 0505 03/22/14 0444  WBC 5.5 8.3  HGB 8.7* 8.6*  HCT 25.7* 25.6*  MCV 94.1 93.1  PLT 184 215   Cardiac Enzymes: No results found for this basename: CKTOTAL, CKMB, CKMBINDEX, TROPONINI,  in the last 72 hours BNP: No components found with this basename: POCBNP,  D-Dimer: No results found for this basename: DDIMER,  in the last 72 hours Hemoglobin A1C: No results found for this basename: HGBA1C,  in the last 72 hours Fasting Lipid Panel: No results found for this basename: CHOL, HDL, LDLCALC, TRIG, CHOLHDL,  LDLDIRECT,  in the last 72 hours Thyroid Function Tests: No results found for this basename: TSH, T4TOTAL, FREET3, T3FREE, THYROIDAB,  in the last 72 hours Anemia Panel: No results found for this basename: VITAMINB12, FOLATE, FERRITIN, TIBC, IRON, RETICCTPCT,  in the last 72 hours Coag Panel:   Lab Results  Component Value Date   INR 1.15 03/13/2014   INR 1.30 05/12/2010   INR 1.05 04/25/2010    RADIOLOGY: Dg Chest 2 View  03/13/2014   CLINICAL DATA:  Shortness of breath, cough, chest congestion, and chest pain. Wegener's granulomatosis.  EXAM: CHEST  2 VIEW  COMPARISON:  02/12/2014, 12/06/2013, 11/07/2013, 10/07/2013, and CT scan dated 10/04/2013  FINDINGS: The lungs are hyperinflated consistent with emphysema. There are multiple patchy areas of ill-defined infiltrate/ scarring as well as cavitary lesions in both lungs. The appearance is essentially unchanged. Heart size and pulmonary vascularity are normal. Large hiatal hernia, stable. No acute osseous abnormality.  IMPRESSION: Severe chronic lung disease with emphysema and multiple areas of scarring, cavitation, and patchy infiltrate consistent with Wegener's granulomatosis.  No significant change since 02/12/2014.   Electronically Signed   By: Rozetta Nunnery M.D.   On: 03/13/2014 11:38  Dg Chest Port 1 View  03/19/2014   CLINICAL DATA:  Central line position.  Right internal jugular.  EXAM: PORTABLE CHEST - 1 VIEW  COMPARISON:  Chest radiograph of 03/13/2014 and CT of 10/04/2013.  FINDINGS: Right internal jugular line terminates at the high SVC. No pneumothorax.  Midline trachea. Mild cardiomegaly with atherosclerosis in the transverse aorta. No pleural fluid. Marked interstitial thickening with scattered reticular nodular opacities. Slightly upper lobe predominant. Overall slightly improved aeration, without new consolidation.  IMPRESSION: Right internal jugular line terminating at the high SVC, without pneumothorax.  Overall improved aeration with  diffuse interstitial thickening and reticular nodular opacification. Presumably related to the clinical history of Wegener's granulomatosis. Atypical infection could look similar.   Electronically Signed   By: Abigail Miyamoto M.D.   On: 03/19/2014 15:34    Assessment/Plan  Principal Problem:  Unstable Class III Angina  Active Problems:  HYPOTHYROIDISM  HYPERLIPIDEMIA  ANEMIA  Wegener's granulomatosis  Chronic respiratory failure  Abnormal nuclear stress test: Intermediate risk with moderate region of apical and inferior scar with mild superimposed ischemia; EF 40%  CAD- severe 4 V CAD- not CABG candidate  Anemia- transfused 03/16/14  Chronic renal insufficiency, stage III (moderate)  Cardiomyopathy, ischemic- EF 40% by Myoview   1. CAD: s/p successful PCI to the LAD and POBA to a chronically occluded LCX. There remains high grade proximal disease to a large proximal Ramus branch as well as mid-RCA disease. This was complicated by right femoral artery pseudoaneurysm requiring repair.  Now s/p PCI with rotablator atherectomy with BMS to the mid RCA.  Due to length of fluoro time and large volume of contrast, it was decided to medically manage the ramus stenosis which Dr. Angelena Form felt was moderately stenosed and if he has recurrent anginal then consider PCI.  Continue ASA/BB/Plavix/statin 2. Right femoral pseudoaneurysm: Day 3 s/p suture repair of femoral artery with evacuation of hematoma .  Needs followup with Dr. Kellie Simmering in 2 weeks 3. Anemia: subsequent to right femoral pseudoaneurysm. Post surgical repair. S/p blood transfusion x 2. Hgb improved from 7.4 to 8.6. Hgb remains stable.  4.  SOB ? Secondary to anemia.  Lungs sound a little junky.  Will get a chest xray to assess.  Check a BNP with history of mild LV dysfunction. Check D-Dimer to rule out PE. OOB to chair.  Incentive spirometry. 5.  VTE prophylaxis - SCD's  Will keep in stepdown one more day and if stable transfer to tele bed  tomorrow.   Sueanne Margarita, MD  03/22/2014  9:23 AM

## 2014-03-23 ENCOUNTER — Inpatient Hospital Stay (HOSPITAL_COMMUNITY): Payer: Medicare Other

## 2014-03-23 DIAGNOSIS — I10 Essential (primary) hypertension: Secondary | ICD-10-CM

## 2014-03-23 LAB — CBC
HCT: 23.3 % — ABNORMAL LOW (ref 39.0–52.0)
HCT: 23.7 % — ABNORMAL LOW (ref 39.0–52.0)
HEMOGLOBIN: 7.9 g/dL — AB (ref 13.0–17.0)
Hemoglobin: 7.9 g/dL — ABNORMAL LOW (ref 13.0–17.0)
MCH: 31.9 pg (ref 26.0–34.0)
MCH: 31.3 pg (ref 26.0–34.0)
MCHC: 33.9 g/dL (ref 30.0–36.0)
MCHC: 33.3 g/dL (ref 30.0–36.0)
MCV: 94 fL (ref 78.0–100.0)
MCV: 94 fL (ref 78.0–100.0)
Platelets: 217 10*3/uL (ref 150–400)
Platelets: 203 10*3/uL (ref 150–400)
RBC: 2.48 MIL/uL — ABNORMAL LOW (ref 4.22–5.81)
RBC: 2.52 MIL/uL — ABNORMAL LOW (ref 4.22–5.81)
RDW: 17.2 % — AB (ref 11.5–15.5)
RDW: 17.2 % — ABNORMAL HIGH (ref 11.5–15.5)
WBC: 6.1 10*3/uL (ref 4.0–10.5)
WBC: 6.4 10*3/uL (ref 4.0–10.5)

## 2014-03-23 MED ORDER — CYCLOPHOSPHAMIDE 50 MG PO CAPS
50.0000 mg | ORAL_CAPSULE | Freq: Every day | ORAL | Status: DC
  Filled 2014-03-23: qty 1

## 2014-03-23 MED ORDER — IOHEXOL 350 MG/ML SOLN: 100.0000 mL | Freq: Once | INTRAVENOUS | Status: AC | PRN

## 2014-03-23 MED ORDER — CYCLOPHOSPHAMIDE 50 MG PO CAPS: 50.0000 mg | ORAL_CAPSULE | Freq: Every day | ORAL | Status: DC

## 2014-03-23 NOTE — Progress Notes (Addendum)
At [place], Alecia Lemming, PA notified by text page via [facility] paging system of patient's Hgb of 7.9 this AM, decreased from a Hgb of 8.6 yesterday AM. Will continue to monitor patient closely.  At 2202, Alecia Lemming, PA notified of repeat Hgb 7.9, patient remains asymptomatic, vascular access sites remain stable. Will continue to monitor patient closely.

## 2014-03-23 NOTE — Progress Notes (Addendum)
SUBJECTIVE:  No SOB but has not been up to walk yet  OBJECTIVE:   Vitals:   Filed Vitals:   03/23/14 0600 03/23/14 0700 03/23/14 0753 03/23/14 0900  BP:      Pulse: 84 99    Temp:    98.5 F (36.9 C)  TempSrc:    Oral  Resp: 16 22    Height:      Weight:      SpO2: 99% 99% 97%    I&O's:   Intake/Output Summary (Last 24 hours) at 03/23/14 0945 Last data filed at 03/23/14 0500  Gross per 24 hour  Intake   1090 ml  Output    350 ml  Net    740 ml   TELEMETRY: Reviewed telemetry pt in NSR:     PHYSICAL EXAM General: Well developed, well nourished, in no acute distress Head: Eyes PERRLA, No xanthomas.   Normal cephalic and atramatic  Lungs:   Scattered rhonchi Heart:   HRRR S1 S2 Pulses are 2+ & equal. Abdomen: Bowel sounds are positive, abdomen soft and non-tender without masses Extremities:   No clubbing, cyanosis or edema.  DP +1 Neuro: Alert and oriented X 3. Psych:  Good affect, responds appropriately   LABS: Basic Metabolic Panel:  Recent Labs  03/21/14 0505 03/22/14 0444  NA 144 142  K 3.7 3.9  CL 110 108  CO2 20 20  GLUCOSE 91 99  BUN 20 22  CREATININE 1.46* 1.42*  CALCIUM 7.7* 8.0*   Liver Function Tests: No results found for this basename: AST, ALT, ALKPHOS, BILITOT, PROT, ALBUMIN,  in the last 72 hours No results found for this basename: LIPASE, AMYLASE,  in the last 72 hours CBC:  Recent Labs  03/23/14 0400 03/23/14 0600  WBC 6.1 6.4  HGB 7.9* 7.9*  HCT 23.3* 23.7*  MCV 94.0 94.0  PLT 217 203   Cardiac Enzymes: No results found for this basename: CKTOTAL, CKMB, CKMBINDEX, TROPONINI,  in the last 72 hours BNP: No components found with this basename: POCBNP,  D-Dimer:  Recent Labs  03/22/14 1340  DDIMER 2.61*   Hemoglobin A1C: No results found for this basename: HGBA1C,  in the last 72 hours Fasting Lipid Panel: No results found for this basename: CHOL, HDL, LDLCALC, TRIG, CHOLHDL, LDLDIRECT,  in the last 72 hours Thyroid  Function Tests: No results found for this basename: TSH, T4TOTAL, FREET3, T3FREE, THYROIDAB,  in the last 72 hours Anemia Panel: No results found for this basename: VITAMINB12, FOLATE, FERRITIN, TIBC, IRON, RETICCTPCT,  in the last 72 hours Coag Panel:   Lab Results  Component Value Date   INR 1.15 03/13/2014   INR 1.30 05/12/2010   INR 1.05 04/25/2010    RADIOLOGY: Dg Chest 2 View  03/22/2014   CLINICAL DATA:  Short of breath  EXAM: CHEST  2 VIEW  COMPARISON:  None.  FINDINGS: Normal cardiac silhouette with ectatic aorta. There is chronic linear markings within the left and right lung. Multiple cavitary lesions are noted on comparison CT of 10/04/2013. Lungs are hyperinflated. No pneumothorax.  IMPRESSION: No change in bilateral pulmonary opacities which represent cavitary lesions on comparison CT.  Hyperinflated lungs.   Electronically Signed   By: Suzy Bouchard M.D.   On: 03/22/2014 12:55   Dg Chest 2 View  03/13/2014   CLINICAL DATA:  Shortness of breath, cough, chest congestion, and chest pain. Wegener's granulomatosis.  EXAM: CHEST  2 VIEW  COMPARISON:  02/12/2014, 12/06/2013, 11/07/2013, 10/07/2013, and  CT scan dated 10/04/2013  FINDINGS: The lungs are hyperinflated consistent with emphysema. There are multiple patchy areas of ill-defined infiltrate/ scarring as well as cavitary lesions in both lungs. The appearance is essentially unchanged. Heart size and pulmonary vascularity are normal. Large hiatal hernia, stable. No acute osseous abnormality.  IMPRESSION: Severe chronic lung disease with emphysema and multiple areas of scarring, cavitation, and patchy infiltrate consistent with Wegener's granulomatosis.  No significant change since 02/12/2014.   Electronically Signed   By: Rozetta Nunnery M.D.   On: 03/13/2014 11:38   Dg Chest Port 1 View  03/19/2014   CLINICAL DATA:  Central line position.  Right internal jugular.  EXAM: PORTABLE CHEST - 1 VIEW  COMPARISON:  Chest radiograph of 03/13/2014  and CT of 10/04/2013.  FINDINGS: Right internal jugular line terminates at the high SVC. No pneumothorax.  Midline trachea. Mild cardiomegaly with atherosclerosis in the transverse aorta. No pleural fluid. Marked interstitial thickening with scattered reticular nodular opacities. Slightly upper lobe predominant. Overall slightly improved aeration, without new consolidation.  IMPRESSION: Right internal jugular line terminating at the high SVC, without pneumothorax.  Overall improved aeration with diffuse interstitial thickening and reticular nodular opacification. Presumably related to the clinical history of Wegener's granulomatosis. Atypical infection could look similar.   Electronically Signed   By: Abigail Miyamoto M.D.   On: 03/19/2014 15:34   Assessment/Plan  Principal Problem:  Unstable Class III Angina  Active Problems:  HYPOTHYROIDISM  HYPERLIPIDEMIA  ANEMIA  Wegener's granulomatosis  Chronic respiratory failure  Abnormal nuclear stress test: Intermediate risk with moderate region of apical and inferior scar with mild superimposed ischemia; EF 40%  CAD- severe 4 V CAD- not CABG candidate  Anemia- transfused 03/16/14  Chronic renal insufficiency, stage III (moderate)  Cardiomyopathy, ischemic- EF 40% by Myoview  1. CAD: s/p successful PCI to the LAD and POBA to a chronically occluded LCX. There remains high grade proximal disease to a large proximal Ramus branch as well as mid-RCA disease. This was complicated by right femoral artery pseudoaneurysm requiring repair. Now s/p PCI with rotablator atherectomy with BMS to the mid RCA. Due to length of fluoro time and large volume of contrast, it was decided to medically manage the ramus stenosis which Dr. Angelena Form felt was moderately stenosed and if he has recurrent anginal then consider PCI. Continue ASA/BB/Plavix/statin  2. Right femoral pseudoaneurysm: Day 3 s/p suture repair of femoral artery with evacuation of hematoma . Needs followup with Dr.  Kellie Simmering in 2 weeks  3. Anemia: subsequent to right femoral pseudoaneurysm. Post surgical repair. S/p blood transfusion x 2. Hgb improved from 7.4 to 8.6. Hgb remains stable.  4. SOB ? Secondary to anemia. Lungs sound a little junky. Chest xray showed multiple cavitary lesions but no CHF. BNP elevated at 4937. D-Dimer elevated at 2.6. Will check chest CT to rule out PE.  Will need to follow renal function closely post CT.  BMET in am 5. VTE prophylaxis - SCD's      Sueanne Margarita, MD  03/23/2014  9:45 AM

## 2014-03-24 ENCOUNTER — Encounter (HOSPITAL_COMMUNITY): Payer: Self-pay | Admitting: Physician Assistant

## 2014-03-24 DIAGNOSIS — N183 Chronic kidney disease, stage 3 unspecified: Secondary | ICD-10-CM

## 2014-03-24 LAB — CBC
HCT: 23.1 % — ABNORMAL LOW (ref 39.0–52.0)
Hemoglobin: 7.6 g/dL — ABNORMAL LOW (ref 13.0–17.0)
MCH: 30.8 pg (ref 26.0–34.0)
MCHC: 32.9 g/dL (ref 30.0–36.0)
MCV: 93.5 fL (ref 78.0–100.0)
PLATELETS: 233 10*3/uL (ref 150–400)
RBC: 2.47 MIL/uL — ABNORMAL LOW (ref 4.22–5.81)
RDW: 16.9 % — AB (ref 11.5–15.5)
WBC: 6.8 10*3/uL (ref 4.0–10.5)

## 2014-03-24 LAB — BASIC METABOLIC PANEL
ANION GAP: 10 (ref 5–15)
BUN: 24 mg/dL — ABNORMAL HIGH (ref 6–23)
CO2: 24 mEq/L (ref 19–32)
CREATININE: 1.56 mg/dL — AB (ref 0.50–1.35)
Calcium: 8.2 mg/dL — ABNORMAL LOW (ref 8.4–10.5)
Chloride: 109 mEq/L (ref 96–112)
GFR, EST AFRICAN AMERICAN: 48 mL/min — AB (ref >90)
GFR, EST NON AFRICAN AMERICAN: 42 mL/min — AB (ref >90)
Glucose, Bld: 86 mg/dL (ref 70–99)
Potassium: 3.9 mEq/L (ref 3.7–5.3)
Sodium: 143 mEq/L (ref 137–147)

## 2014-03-24 MED ORDER — CYCLOPHOSPHAMIDE 50 MG PO CAPS
50.0000 mg | ORAL_CAPSULE | Freq: Every day | ORAL | Status: DC
  Filled 2014-03-24: qty 1

## 2014-03-24 NOTE — Progress Notes (Signed)
SUBJECTIVE:  No SOB while laying down. Wants to walk around with wife. Very weak. With chronic cough. Requests  Central line be removed.   OBJECTIVE:   Vitals:   Filed Vitals:   03/23/14 2027 03/23/14 2044 03/24/14 0235 03/24/14 0531  BP:  122/63 128/72 126/65  Pulse:  75 78 80  Temp:  98.7 F (37.1 C) 97.6 F (36.4 C) 97.3 F (36.3 C)  TempSrc:  Oral Oral Oral  Resp:  20 20 20   Height:      Weight:    118 lb 11.9 oz (53.863 kg)  SpO2: 97% 99% 100% 100%   I&O's:    Intake/Output Summary (Last 24 hours) at 03/24/14 0935 Last data filed at 03/24/14 5093  Gross per 24 hour  Intake    660 ml  Output    895 ml  Net   -235 ml   TELEMETRY: Reviewed telemetry pt in NSR:  PHYSICAL EXAM General: Well developed, well nourished, in no acute distress Head: Eyes PERRLA, No xanthomas.   Normal cephalic and atramatic  Lungs:   Scattered rhonchi Heart:   HRRR S1 S2 Pulses are 2+ & equal. Abdomen: Bowel sounds are positive, abdomen soft and non-tender without masses Extremities:   No clubbing, cyanosis or edema.  DP +1 Neuro: Alert and oriented X 3. Psych:  Good affect, responds appropriately   LABS: Basic Metabolic Panel:  Recent Labs  03/22/14 0444 03/24/14 0425  NA 142 143  K 3.9 3.9  CL 108 109  CO2 20 24  GLUCOSE 99 86  BUN 22 24*  CREATININE 1.42* 1.56*  CALCIUM 8.0* 8.2*    CBC:  Recent Labs  03/23/14 0600 03/24/14 0425  WBC 6.4 6.8  HGB 7.9* 7.6*  HCT 23.7* 23.1*  MCV 94.0 93.5  PLT 203 233   D-Dimer:  Recent Labs  03/22/14 1340  DDIMER 2.61*    Coag Panel:   Lab Results  Component Value Date   INR 1.15 03/13/2014   INR 1.30 05/12/2010   INR 1.05 04/25/2010    RADIOLOGY: Dg Chest 2 View  03/22/2014   CLINICAL DATA:  Short of breath  EXAM: CHEST  2 VIEW  COMPARISON:  None.  FINDINGS: Normal cardiac silhouette with ectatic aorta. There is chronic linear markings within the left and right lung. Multiple cavitary lesions are noted on  comparison CT of 10/04/2013. Lungs are hyperinflated. No pneumothorax.  IMPRESSION: No change in bilateral pulmonary opacities which represent cavitary lesions on comparison CT.  Hyperinflated lungs.  \  Dg Chest 2 View  03/13/2014   CLINICAL DATA:  Shortness of breath, cough, chest congestion, and chest pain. Wegener's granulomatosis.  EXAM: CHEST  2 VIEW  COMPARISON:  02/12/2014, 12/06/2013, 11/07/2013, 10/07/2013, and CT scan dated 10/04/2013  FINDINGS: The lungs are hyperinflated consistent with emphysema. There are multiple patchy areas of ill-defined infiltrate/ scarring as well as cavitary lesions in both lungs. The appearance is essentially unchanged. Heart size and pulmonary vascularity are normal. Large hiatal hernia, stable. No acute osseous abnormality.  IMPRESSION: Severe chronic lung disease with emphysema and multiple areas of scarring, cavitation, and patchy infiltrate consistent with Wegener's granulomatosis.  No significant change since 02/12/2014.     Dg Chest Port 1 View  03/19/2014   CLINICAL DATA:  Central line position.  Right internal jugular.  EXAM: PORTABLE CHEST - 1 VIEW  COMPARISON:  Chest radiograph of 03/13/2014 and CT of 10/04/2013.  FINDINGS: Right internal jugular line terminates at the  high SVC. No pneumothorax.  Midline trachea. Mild cardiomegaly with atherosclerosis in the transverse aorta. No pleural fluid. Marked interstitial thickening with scattered reticular nodular opacities. Slightly upper lobe predominant. Overall slightly improved aeration, without new consolidation.  IMPRESSION: Right internal jugular line terminating at the high SVC, without pneumothorax.  Overall improved aeration with diffuse interstitial thickening and reticular nodular opacification. Presumably related to the clinical history of Wegener's granulomatosis. Atypical infection could look similar.  CTA on 03/23/14 IMPRESSION:  1. No evidence of a pulmonary embolus.  2. There are lung  abnormalities with peribronchial interstitial  thickening most evident in the lower lobes, right middle lobe and  inferior left upper lobe, areas of mucous plugging in bronchi, and  other smaller areas of ill-defined focal airspace opacity. Acute  bronchitis with areas of multifocal pneumonia is suspected. The  bronchial mucous plugging and interstitial thickening may be chronic  with the small focal areas of lung opacity reflecting a more chronic  process such as organizing pneumonia.    Assessment/Plan  Principal Problem:  Unstable Class III Angina  Active Problems:  HYPOTHYROIDISM  HYPERLIPIDEMIA  ANEMIA  Wegener's granulomatosis  Chronic respiratory failure   Richard [name] is a 78 y.o. male with a history of hypothyroidism, HLD, anemia, Wegener's granulomatosis and no prior cardiac disease who presented to [facility] ED on 03/13/14 with chest pain and was transferred to [facility] for cath the following morning.  CAD: Abnormal nuclear stress test on 03/12/14: Intermediate risk with moderate region of apical and inferior scar with mild superimposed ischemia; EF 40%  -- s/p LHC on successful PCI to the LAD and POBA to a chronically occluded LCX. There remained high grade proximal disease to a large proximal Ramus branch as well as mid-RCA disease. This was complicated by right femoral artery pseudoaneurysm requiring repair. Now s/p PCI with rotablator atherectomy with BMS to the mid RCA. Due to length of fluoro time and large volume of contrast, it was decided to medically manage the ramus stenosis which Dr. Angelena Form felt was moderately stenosed and if he has recurrent anginal then consider PCI. Continue ASA/BB/Plavix/statin   Right femoral pseudoaneurysm: Day 4 s/p suture repair of femoral artery with evacuation of hematoma . Still with significant hematoma down buttox and hamstring and CBC dropping. MD to see.  -- Needs followup with Dr. Kellie Simmering in 2 weeks   Anemia: subsequent to right femoral  pseudoaneurysm. Post surgical repair. S/p blood transfusion x 2. Hgb improved from 7.4 to 8.6. Now back down to 7.6 today. Still with significant hematoma down buttox and hamstring and CBC dropping. MD to see.   SOB  Though to be possibly due to anemia. Chest xray showed multiple cavitary lesions but no CHF. CTA with multiple abnormalities possibly reflecting acute bronchitis vs organizing PNA. -- BNP elevated at 4937. D-Dimer elevated at 2.6. CTA with no acute PE. No s/s of volume overload  Chronic renal insufficiency, stage III (moderate).  -- Creat elevated but stable after contrast exposure with CTA yesterday. -- Creat 1.56 today. Continue to monitor   Would like central line removed. MD to see.     Perry Mount, PA-C  03/24/2014  9:35 AM  I have examined the patient and reviewed assessment and plan and discussed with patient.  Agree with above as stated.  Recovering slowly post bleeding episode.  Bruising spreading.  Right groin firm around incision but no active bleeding.  PT consult.  Watch CBC.  Holding off on another transfusion.  Will have  pulmonary see given CT scan findings and h/o Wegeners.  Difficult peripheral IV access.  Leave central line in for now.  VARANASI,JAYADEEP S.

## 2014-03-24 NOTE — Progress Notes (Signed)
03/24/2014 1354  Date Additional Medicare IM given: 03/24/2014 Additional Medicare IM given by:  Jonnie Finner Jonnie Finner RN CCM Case Mgmt phone [phone]

## 2014-03-24 NOTE — Progress Notes (Signed)
CARDIAC REHAB PHASE I   PRE:  Rate/Rhythm: 90 SR    BP: sitting 106/48, 88/50    SaO2: 95 RA  MODE:  Ambulation: 80 ft   POST:  Rate/Rhythm: 94 SR    BP: sitting 110/60     SaO2: 96 RA  Pt has been up to BR at the most. Able to walk with RW. DOE but SAO2 97 RA in hall. HR and BP stable. To recliner. Will f/u. Would benefit from PT for strengthening.  [id number]  Josephina Shih [place] CES, ACSM 03/24/2014 11:31 AM

## 2014-03-24 NOTE — Consult Note (Signed)
Name: Richard [name] MRN: [id number] DOB: 06-25-1936    ADMISSION DATE:  03/13/2014 CONSULTATION DATE:  8/17  REFERRING MD :  Hilty  PRIMARY SERVICE:  Cardiology   CHIEF COMPLAINT: abnormal CT findings    BRIEF PATIENT DESCRIPTION:  78 year old male admitted on 8/6 with CP and fatigue. Cath was initially done on 8/7 showed severe 4V disease. Ultimately had successful PCI to the LAD and POBA to a chronically occluded LCX and PCI with rotablator atherectomy with BMS to the mid RCA.During his post-cath stay he had d-dimer done by cardiology to evaluate poor activity tol. This was elevated so CT chest obtained showing significant bronchiectatic changes. PCCM was re-consulted for abnormal CT change.    SIGNIFICANT EVENTS / STUDIES:  8/6 admitted for CP 8/7 cardiac cath 4V disease. Felt needed CABG 8/8 seen by [name] (follows out pt) for pulm clearance. Michela Pitcher would prefer PCI and CABG Only if absolutely necessary as functional status poor and risk of post-op pulm complications high. ---> turned down by CVTS.  8/10 successful PCI to the LAD and POBA to a chronically occluded LCX 8/10. There remained high grade proximal disease to a large proximal Ramus branch as well as mid-RCA disease. 8/10 femoral pseudoaneurysm 8/12 pseudoaneurysm  repaired by vascular   8/14 PCI with rotablator atherectomy with BMS to the mid RCA 8/15: chronic dyspnea. Poor activity tol--> primary service orders ddimer: 2.61 so CT chest ordered  8/16: CT chest: 1. No evidence of a pulmonary embolus. 2. There are lung abnormalities with peribronchial interstitial  thickening most evident in the lower lobes, right middle lobe and inferior left upper lobe, areas of mucous plugging in bronchi, and other smaller areas of ill-defined focal airspace opacity. PCCM consulted for these findings on 8/17. Of note not much different than film in Feb 2015.    HISTORY OF PRESENT ILLNESS:    78 year old male admitted on 8/6 with CP and  fatigue. Cath was initially done on 8/7 showed severe 4V disease. Seen by Dr [name] 8/8 for pre-op CABG. Poor surgical candidate. Recommended PCI/stent over CABG. Thoracic surgery agreed. He had successful PCI to the LAD and POBA to a chronically occluded LCX 8/10. There remained high grade proximal disease to a large proximal Ramus branch as well as mid-RCA disease. His initial post-cath course was complicated by pseudoaneurysm on 8/10. Ended up going for exploration by vascular and repair on 8/12. Now s/p PCI with rotablator atherectomy with BMS to the mid RCA 8/14. During his post-cath stay he had d-dimer done by cardiology to evaluate poor activity tol. This was elevated so CT chest obtained showing significant bronchiectatic changes. PCCM was re-consulted for abnormal CT change.  PAST MEDICAL HISTORY :  Past Medical History  Diagnosis Date  . GERD (gastroesophageal reflux disease)   . Asthma   . Fungal infection     lungs  . Cataract   . BPH (benign prostatic hyperplasia)     sees Dr. Risa Grill, biopsy June 2015 was benign   . Wegener's granulomatosis     sees Dr. [name]   . Peptic stricture of esophagus   . Anemia   . Hiatal hernia   . Adenomatous colon polyp   . Bronchiectasis   . On home oxygen therapy     uses 2 l/m nasally at bedtime  . HOH (hard of hearing)     bilaterally  . HTN (hypertension) 09/28/2013   Past Surgical History  Procedure Laterality Date  .  Hernia repair    . Eye surgery      cataracts removed.   . Colonoscopy  08-16-11    per Dr. Fuller Plan, diverticulosis and polyps, repeat in 5 yrs   . Esophagogastroduodenoscopy (egd) with esophageal dilation  11-29-10    per Dr. Fuller Plan   . Cataract extraction, bilateral  12-18-12    bilateral  . Cholecystectomy N/A 12/21/2012    Procedure: LAPAROSCOPIC CHOLECYSTECTOMY WITH INTRAOPERATIVE CHOLANGIOGRAM;  Surgeon: Edward Jolly, MD;  Location: [facility];  Service: General;  Laterality: N/A;  . Hematoma evacuation Right 03/19/2014     Procedure: Suture repair of femoral artery with evacuation of hematoma;  Surgeon: Mal Misty, MD;  Location: [facility];  Service: Vascular;  Laterality: Right;   Prior to Admission medications   Medication Sig Start Date End Date Taking? Authorizing Provider  acetaminophen (TYLENOL) 325 MG tablet Take 650 mg by mouth every 6 (six) hours as needed for mild pain.   Yes Historical Provider, MD  aspirin 81 MG tablet Take 81 mg by mouth every evening.    Yes Historical Provider, MD  b complex vitamins tablet Take 1 tablet by mouth daily.     Yes Historical Provider, MD  BIOTIN PO Take 1 tablet by mouth 3 (three) times daily with meals.   Yes Historical Provider, MD  bisacodyl (DULCOLAX) 5 MG EC tablet Take 5 mg by mouth daily as needed. CONSTIPATION   Yes Historical Provider, MD  budesonide-formoterol (SYMBICORT) 160-4.5 MCG/ACT inhaler Inhale 2 puffs into the lungs 2 (two) times daily.   Yes Historical Provider, MD  cyclophosphamide (CYTOXAN) 50 MG tablet Take 50 mg by mouth daily. Give on an empty stomach once daily at 1600. Take  1 hour before or 2 hours after meals. 10/24/13  Yes Tanda Rockers, MD  dextromethorphan (DELSYM) 30 MG/5ML liquid Take 30 mg by mouth 2 (two) times daily as needed for cough.   Yes Historical Provider, MD  dextromethorphan-guaiFENesin (MUCINEX DM) 30-600 MG per 12 hr tablet Take 1 tablet by mouth 2 (two) times daily.    Yes Historical Provider, MD  dutasteride (AVODART) 0.5 MG capsule Take 0.5 mg by mouth every other day. In the evening.   Yes Historical Provider, MD  esomeprazole (NEXIUM) 20 MG capsule Take 20 mg by mouth 2 (two) times daily before a meal.    Yes Historical Provider, MD  levalbuterol (XOPENEX HFA) 45 MCG/ACT inhaler Inhale 1-2 puffs into the lungs every 4 (four) hours as needed for wheezing. 04/26/13  Yes Tanda Rockers, MD  loratadine (CLARITIN) 10 MG tablet Take 10 mg by mouth daily.    Yes Historical Provider, MD  methylcellulose (CITRUCEL) oral powder  Take 1 packet by mouth daily.   Yes Historical Provider, MD  predniSONE (DELTASONE) 10 MG tablet Take 10 mg by mouth daily with breakfast.   Yes Historical Provider, MD  Simethicone (GAS-X PO) Take 2 tablets by mouth 2 (two) times daily.    Yes Historical Provider, MD  sulfamethoxazole-trimethoprim (BACTRIM DS) 800-160 MG per tablet Take 1 tablet by mouth daily. Take with Cytoxan.   Yes Historical Provider, MD  tamsulosin (FLOMAX) 0.4 MG CAPS capsule Take 1 capsule (0.4 mg total) by mouth every evening. 02/18/14  Yes [name], MD  traMADol Veatrice Bourbon) 50 MG tablet 1-2 every 4 hours as needed for cough or pain 09/12/13  Yes Tanda Rockers, MD   Allergies  Allergen Reactions  . Factive [Gemifloxacin]     Ineffective  FAMILY HISTORY:  Family History  Problem Relation Age of Onset  . Asthma Father   . Coronary artery disease Brother   . Coronary artery disease Brother   . Coronary artery disease Mother    SOCIAL HISTORY:  reports that he has never smoked. He has never used smokeless tobacco. He reports that he does not drink alcohol or use illicit drugs.  REVIEW OF SYSTEMS:   Constitutional: Negative for fever, chills, weight loss, malaise/fatigue and diaphoresis.  HENT: Negative for hearing loss, ear pain, nosebleeds, congestion, sore throat, neck pain, tinnitus and ear discharge.   Eyes: Negative for blurred vision, double vision, photophobia, pain, discharge and redness.  Respiratory:cough at baseline yellow sputum, hemoptysis,  shortness of breath at baseline, wheezing and stridor.   Cardiovascular: Negative for chest pain, palpitations, orthopnea, claudication, leg swelling and PND.  Gastrointestinal: Negative for heartburn, nausea, vomiting, abdominal pain, diarrhea, constipation, blood in stool and melena.  Genitourinary: Negative for dysuria, urgency, frequency, hematuria and flank pain.  Musculoskeletal: Negative for myalgias, back pain, joint pain and falls.  Skin: Negative  for itching and rash.  Neurological: Negative for dizziness, tingling, tremors, sensory change, speech change, focal weakness, seizures, loss of consciousness, weakness and headaches.  Endo/Heme/Allergies: Negative for environmental allergies and polydipsia. Does not bruise/bleed easily.  SUBJECTIVE:  Fatigued   VITAL SIGNS: Temp:  [97.3 F (36.3 C)-98.7 F (37.1 C)] 97.3 F (36.3 C) (08/17 0531) Pulse Rate:  [75-88] 88 (08/17 0956) Resp:  [20] 20 (08/17 0531) BP: (116-128)/(63-76) 116/76 mmHg (08/17 0956) SpO2:  [95 %-100 %] 95 % (08/17 0952) Weight:  [118 lb 11.9 oz (53.863 kg)] 118 lb 11.9 oz (53.863 kg) (08/17 0531)  PHYSICAL EXAMINATION: General:  Chronically ill appearing man, NAD Neuro:  Alert and interactive, moves all ext to command. HEENT:  [place]/AT, PERRL, EOM-I and MMM Cardiovascular:  RRR, Nl S1/S2, -M/R/G. Lungs:  Bibasilar crackles L>R. Abdomen:  Soft, NT, ND and +BS. Musculoskeletal:  -edema and -tenderness. Skin:  Intact.   Recent Labs Lab 03/21/14 0505 03/22/14 0444 03/24/14 0425  NA 144 142 143  K 3.7 3.9 3.9  CL 110 108 109  CO2 20 20 24   BUN 20 22 24*  CREATININE 1.46* 1.42* 1.56*  GLUCOSE 91 99 86    Recent Labs Lab 03/23/14 0400 03/23/14 0600 03/24/14 0425  HGB 7.9* 7.9* 7.6*  HCT 23.3* 23.7* 23.1*  WBC 6.1 6.4 6.8  PLT 217 203 233   Ct Angio Chest Pe W/cm &/or Wo Cm  03/23/2014   CLINICAL DATA:  Elevated D-dimer.  EXAM: CT ANGIOGRAPHY CHEST WITH CONTRAST  TECHNIQUE: Multidetector CT imaging of the chest was performed using the standard protocol during bolus administration of intravenous contrast. Multiplanar CT image reconstructions and MIPs were obtained to evaluate the vascular anatomy.  CONTRAST:  100 mL of Omnipaque 350 intravenous contrast.  COMPARISON:  Current chest radiograph.  Chest CT dated 10/04/13  FINDINGS: Study somewhat limited by motion in the lung bases limiting evaluation for PE in the smaller segmental and subsegmental  vessels. Allowing for this, there is no evidence of pulmonary embolism.  Heart is normal in size. There are dense coronary artery calcifications. Great vessels are normal in caliber for age. No aortic dissection. No neck base, axillary, mediastinal or hilar masses or pathologically enlarged lymph nodes.  There is peribronchial interstitial thickening most evident in the lower lobes, right middle lobe and lingula of the left upper lobe. There are multiple areas of mucous plugging distending bronchi,  most prominently in the left lower lobe. There are additional areas of patchy airspace opacity in the lungs, most prominently in the posterior medial right lower lobe and lateral left upper lobe. Additional areas of peripheral reticular opacity are likely due to subsegmental atelectasis. Minimal right pleural effusion. No pneumothorax.  Limited evaluation of the upper abdomen shows a moderate hiatal hernia but is otherwise unremarkable. No significant bony abnormality.  Review of the MIP images confirms the above findings.  IMPRESSION: 1. No evidence of a pulmonary embolus. 2. There are lung abnormalities with peribronchial interstitial thickening most evident in the lower lobes, right middle lobe and inferior left upper lobe, areas of mucous plugging in bronchi, and other smaller areas of ill-defined focal airspace opacity. Acute bronchitis with areas of multifocal pneumonia is suspected. The bronchial mucous plugging and interstitial thickening may be chronic with the small focal areas of lung opacity reflecting a more chronic process such as organizing pneumonia.   Electronically Signed   By: Lajean Manes M.D.   On: 03/23/2014 13:05    ASSESSMENT / PLAN: H/o Wegener's and associated bronchiectasis (Long standing and followed by Dr [name]) managed on cytoxan, prednisone and bactrim for PCP prophylaxis. CT scan not significantly changed from feb 2015. No evidence of active infection.  rec Cont 02 as needed Cont  home BDs Cont cytoxan, pred and bactrim  Trend fever curve and watch closely for change in mucus.   Dyspnea at baseline: likely multifactorial. Would be concerned that anemia super-imposed on underlying lung disease is a a significant factor.  rec  Cont medical rx   CAD S/p successful PCI to the LAD and POBA to a chronically occluded LCX.  Right femoral pseudoaneurysm s/p surgical repair rec Per cards and vascular   Anemia  (ABL from above) rec Transfuse if < 7 or symptomatic   CRI rec Will need to watch closely given recent contrast load.   Patient is at baseline at this point, the CT findings are unchanged, would not recommend any changes at this point.  Continue current management.  PCCM will follow with you.  Rush Farmer, M.D. [facility] Pulmonary/Critical Care Medicine. Pager: [phone]. After hours pager: [phone].  03/24/2014, 2:45 PM

## 2014-03-24 NOTE — Progress Notes (Signed)
UR completed Josemiguel Gries K. Bradely Rudin, RN, BSN, [place], CCM  03/24/2014 5:12 PM

## 2014-03-25 ENCOUNTER — Ambulatory Visit: Payer: Medicare Other | Admitting: Internal Medicine

## 2014-03-25 LAB — URINALYSIS, ROUTINE W REFLEX MICROSCOPIC
Bilirubin Urine: NEGATIVE
Glucose, UA: NEGATIVE mg/dL
Ketones, ur: NEGATIVE mg/dL
Leukocytes, UA: NEGATIVE
Nitrite: NEGATIVE
Protein, ur: 30 mg/dL — AB
Specific Gravity, Urine: 1.017 (ref 1.005–1.030)
UROBILINOGEN UA: 0.2 mg/dL (ref 0.0–1.0)
pH: 5.5 (ref 5.0–8.0)

## 2014-03-25 LAB — CBC
HCT: 23.1 % — ABNORMAL LOW (ref 39.0–52.0)
HEMOGLOBIN: 7.9 g/dL — AB (ref 13.0–17.0)
MCH: 32.1 pg (ref 26.0–34.0)
MCHC: 34.2 g/dL (ref 30.0–36.0)
MCV: 93.9 fL (ref 78.0–100.0)
PLATELETS: 271 10*3/uL (ref 150–400)
RBC: 2.46 MIL/uL — ABNORMAL LOW (ref 4.22–5.81)
RDW: 16.9 % — AB (ref 11.5–15.5)
WBC: 6.6 10*3/uL (ref 4.0–10.5)

## 2014-03-25 LAB — URINE MICROSCOPIC-ADD ON

## 2014-03-25 LAB — BASIC METABOLIC PANEL
ANION GAP: 12 (ref 5–15)
BUN: 23 mg/dL (ref 6–23)
CALCIUM: 8.2 mg/dL — AB (ref 8.4–10.5)
CO2: 23 mEq/L (ref 19–32)
Chloride: 110 mEq/L (ref 96–112)
Creatinine, Ser: 1.41 mg/dL — ABNORMAL HIGH (ref 0.50–1.35)
GFR calc non Af Amer: 47 mL/min — ABNORMAL LOW (ref >90)
GFR, EST AFRICAN AMERICAN: 55 mL/min — AB (ref >90)
GLUCOSE: 85 mg/dL (ref 70–99)
POTASSIUM: 3.9 meq/L (ref 3.7–5.3)
SODIUM: 145 meq/L (ref 137–147)

## 2014-03-25 MED ORDER — CYCLOPHOSPHAMIDE 50 MG PO CAPS
50.0000 mg | ORAL_CAPSULE | Freq: Every day | ORAL | Status: DC
  Filled 2014-03-25: qty 1

## 2014-03-25 NOTE — Progress Notes (Addendum)
Name: Richard [name] MRN: [id number] DOB: Jan 17, 1936    ADMISSION DATE:  03/13/2014 CONSULTATION DATE:  8/17  REFERRING MD :  Hilty  PRIMARY SERVICE:  Cardiology   CHIEF COMPLAINT: abnormal CT findings   BRIEF PATIENT DESCRIPTION:  78 yo male admitted with chest pain/fatigue.  He has hx of BTX and Wegener's followed by Dr. [name].  He was tx with cytoxan/prednisone in 2012.  His tx with cytoxan was restarted in January 2015.  SIGNIFICANT EVENTS: 8/6 admitted for CP 8/7 cardiac cath 4V disease. Felt needed CABG 8/8 seen by [name] (follows out pt) for pulm clearance. Michela Pitcher would prefer PCI and CABG Only if absolutely necessary as functional status poor and risk of post-op pulm complications high. ---> turned down by CVTS.  8/10 successful PCI to the LAD and POBA to a chronically occluded LCX 8/10. There remained high grade proximal disease to a large proximal Ramus branch as well as mid-RCA disease. 8/10 femoral pseudoaneurysm 8/12 pseudoaneurysm  repaired by vascular   8/14 PCI with rotablator atherectomy with BMS to the mid RCA  STUDIES:  08/19/13 C-ANCA >> 1:80 10-08-2013 CT chest >> extensive multifocal BTX with thin walled cavitary nodules 03/14/14 PFT >> FEV1 1.26 (47%), FEV1% 48, DLCO 45% 03/15/14 Echo >> EF 40 to 57%, grade 1 diastolic dysfx 8/46/96 CT chest >> lower lobe predominant interstitial thickening with mucus plugs, moderate HH  SUBJECTIVE:  He c/o cough with yellow, thick sputum.  Denies wheeze, chest pain, hemoptysis.  VITAL SIGNS: Temp:  [99 F (37.2 C)-99.4 F (37.4 C)] 99.3 F (37.4 C) (08/18 0627) Pulse Rate:  [81-88] 84 (08/18 0627) Resp:  [17-18] 17 (08/18 0627) BP: (116-128)/(62-76) 128/62 mmHg (08/18 0627) SpO2:  [95 %-100 %] 95 % (08/18 0627) Weight:  [116 lb 2.9 oz (52.7 kg)] 116 lb 2.9 oz (52.7 kg) (08/18 0627)  PHYSICAL EXAMINATION: General:  Chronically ill appearing man, NAD Neuro:  Alert and interactive, moves all ext to command. HEENT:  No  LAN/JVD Cardiovascular:  RRR, Nl S1/S2, -M/R/G. Lungs:  Diminished in bases Abdomen:  Soft, NT, ND and +BS. Musculoskeletal:  -edema and -tenderness. Skin:  Rt groin surgical site with staples, ecchymosis, 2+ bilat pedal pulses.   Recent Labs Lab 03/22/14 0444 03/24/14 0425 03/25/14 0442  NA 142 143 145  K 3.9 3.9 3.9  CL 108 109 110  CO2 20 24 23   BUN 22 24* 23  CREATININE 1.42* 1.56* 1.41*  GLUCOSE 99 86 85    Recent Labs Lab 03/23/14 0600 03/24/14 0425 03/25/14 0442  HGB 7.9* 7.6* 7.9*  HCT 23.7* 23.1* 23.1*  WBC 6.4 6.8 6.6  PLT 203 233 271   Ct Angio Chest Pe W/cm &/or Wo Cm  03/23/2014   CLINICAL DATA:  Elevated D-dimer.  EXAM: CT ANGIOGRAPHY CHEST WITH CONTRAST  TECHNIQUE: Multidetector CT imaging of the chest was performed using the standard protocol during bolus administration of intravenous contrast. Multiplanar CT image reconstructions and MIPs were obtained to evaluate the vascular anatomy.  CONTRAST:  100 mL of Omnipaque 350 intravenous contrast.  COMPARISON:  Current chest radiograph.  Chest CT dated Oct 08, 2013  FINDINGS: Study somewhat limited by motion in the lung bases limiting evaluation for PE in the smaller segmental and subsegmental vessels. Allowing for this, there is no evidence of pulmonary embolism.  Heart is normal in size. There are dense coronary artery calcifications. Great vessels are normal in caliber for age. No aortic dissection. No neck base, axillary, mediastinal or hilar masses  or pathologically enlarged lymph nodes.  There is peribronchial interstitial thickening most evident in the lower lobes, right middle lobe and lingula of the left upper lobe. There are multiple areas of mucous plugging distending bronchi, most prominently in the left lower lobe. There are additional areas of patchy airspace opacity in the lungs, most prominently in the posterior medial right lower lobe and lateral left upper lobe. Additional areas of peripheral reticular  opacity are likely due to subsegmental atelectasis. Minimal right pleural effusion. No pneumothorax.  Limited evaluation of the upper abdomen shows a moderate hiatal hernia but is otherwise unremarkable. No significant bony abnormality.  Review of the MIP images confirms the above findings.  IMPRESSION: 1. No evidence of a pulmonary embolus. 2. There are lung abnormalities with peribronchial interstitial thickening most evident in the lower lobes, right middle lobe and inferior left upper lobe, areas of mucous plugging in bronchi, and other smaller areas of ill-defined focal airspace opacity. Acute bronchitis with areas of multifocal pneumonia is suspected. The bronchial mucous plugging and interstitial thickening may be chronic with the small focal areas of lung opacity reflecting a more chronic process such as organizing pneumonia.   Electronically Signed   By: Lajean Manes M.D.   On: 03/23/2014 13:05    Impression:  78 yo male with hx of BTX, COPD, and Wegener's on chronic cytoxan/prednisone with bactrim PCP prophylaxis.  He has changes on CT chest from 03/23/14 as detailed above >> appearance is improved compared to February 2015.  He has persistent dyspnea >> likely multifactorial from CAD, deconditioning, anemia, COPD/BTX with Wegener's.  Wegener's granulomatosis. Plan: Continue cytoxan, prednisone Bactrim for PCP prophylaxis  COPD, BTX, productive cough. Plan: Continue symbicort with prn xopenex Augment bronchial hygiene Check sputum cx >> defer Abx for now  CAD, systolic heart failure. Plan: Per cardiology  Anemia of chronic disease. Plan: F/u CBC  CKD. Plan: Check u/a to assess whether he has Wegener's involvement of kidneys  Deconditioning. Plan: Likely would benefit from pulmonary rehab as outpt   Richardson Landry Minor ACNP Maryanna Shape PCCM Pager [phone] till 3 pm If no answer page [phone] 03/25/2014, 9:08 AM  Updated pt's wife about plan.  Chesley Mires, MD [facility]  Pulmonary/Critical Care 03/25/2014, 10:00 AM Pager:  [phone] After 3pm call: [phone]

## 2014-03-25 NOTE — Evaluation (Signed)
Physical Therapy Evaluation Patient Details Name: GERALDINE TESAR MRN: [id number] DOB: [birth year] Today's Date: 03/25/2014   History of Present Illness  Adm 03/13/14 with chest pain; cardiac cath 8/7 with severe 4 vessel disease; pulmonary consult not a good surgical candidate for CABG; 8/10 & 8/14 cardiac cath PCI and atherectomy; 8/12 Rt femoral pseudoanerysm repair  PMHx-Wegener's granulomatosis, HTN, HOH  Clinical Impression  Pt admitted with chest pain and underwent above procedures. Pt currently with functional limitations due to the deficits listed below (see PT Problem List).  Pt will benefit from skilled PT to increase their independence and safety with mobility to allow discharge to the venue listed below. Anticipate good progress as pt has remarkably maintained good strength and balance despite prolonged limited activity. ? If pt would benefit from outpatient Pulmonary Rehab on d/c. Encouraged pt to discuss with his pulmonologist.      Follow Up Recommendations No PT follow up    Equipment Recommendations  None recommended by PT    Recommendations for Other Services   ? OP Pulmonary Rehab    Precautions / Restrictions Restrictions Weight Bearing Restrictions: No      Mobility  Bed Mobility Overal bed mobility: Modified Independent             General bed mobility comments: HOB elevated  Transfers Overall transfer level: Needs assistance Equipment used: None Transfers: Sit to/from Stand Sit to Stand: Supervision         General transfer comment: for safety only due to prolonged inactivity  Ambulation/Gait Ambulation/Gait assistance: Min guard Ambulation Distance (Feet): 80 Feet Assistive device: None Gait Pattern/deviations: WFL(Within Functional Limits) Gait velocity: slow   General Gait Details: pt reports ambulating slower than his usual due to dyspnea (although reports dyspnea improved compared to week PTA)  Stairs            Wheelchair  Mobility    Modified Rankin (Stroke Patients Only)       Balance Overall balance assessment: Needs assistance                       Rhomberg - Eyes Opened: 30 Rhomberg - Eyes Closed: 30     Standardized Balance Assessment Standardized Balance Assessment : Berg Balance Test Berg Balance Test Sit to Stand: Able to stand without using hands and stabilize independently Standing Unsupported: Able to stand safely 2 minutes Sitting with Back Unsupported but Feet Supported on Floor or Stool: Able to sit safely and securely 2 minutes Stand to Sit: Sits safely with minimal use of hands Transfers: Able to transfer safely, minor use of hands Standing Unsupported with Eyes Closed: Able to stand 10 seconds safely Standing Ubsupported with Feet Together: Able to place feet together independently and stand for 1 minute with supervision From Standing, Reach Forward with Outstretched Arm: Can reach forward >12 cm safely (5") From Standing Position, Turn to Look Behind Over each Shoulder: Looks behind from both sides and weight shifts well         Pertinent Vitals/Pain Pain Assessment: No/denies pain  HR 95-102 with activity SaO2 on room air 92 with incr to 95% while walking Dyspnea 2/4    Home Living Family/patient expects to be discharged to:: Private residence Living Arrangements: Spouse/significant other Available Help at Discharge: Family;Available 24 hours/day Type of Home: House Home Access: Stairs to enter Entrance Stairs-Rails: None Entrance Stairs-Number of Steps: 2 Home Layout: One level Home Equipment: Cane - single point;Shower seat;Tub bench;Hand held shower head;Walker -  2 wheels      Prior Function Level of Independence: Independent         Comments: was only walking short household distances PTA due to dyspnea     Hand Dominance        Extremity/Trunk Assessment   Upper Extremity Assessment: Overall WFL for tasks assessed           Lower  Extremity Assessment: Overall WFL for tasks assessed      Cervical / Trunk Assessment: Normal  Communication   Communication: HOH  Cognition Arousal/Alertness: Awake/alert Behavior During Therapy: WFL for tasks assessed/performed Overall Cognitive Status: Within Functional Limits for tasks assessed                      General Comments      Exercises General Exercises - Lower Extremity Ankle Circles/Pumps: AROM;Both;10 reps Quad Sets: AROM;Both;5 reps Heel Slides:  (educated on technique) Low Level/ICU Exercises Stabilized Bridging:  (educated on technique)      Assessment/Plan    PT Assessment Patient needs continued PT services  PT Diagnosis Generalized weakness   PT Problem List Decreased strength;Decreased balance;Decreased mobility;Cardiopulmonary status limiting activity  PT Treatment Interventions Gait training;Stair training;Functional mobility training;Therapeutic activities;Therapeutic exercise;Balance training;Patient/family education   PT Goals (Current goals can be found in the Care Plan section) Acute Rehab PT Goals Patient Stated Goal: perhaps return to bowling PT Goal Formulation: With patient Time For Goal Achievement: 04/01/14 Potential to Achieve Goals: Good    Frequency Min 3X/week   Barriers to discharge        Co-evaluation               End of Session Equipment Utilized During Treatment: Gait belt Activity Tolerance: Patient tolerated treatment well Patient left: in chair;with call bell/phone within reach;with family/visitor present           Time: 3329-5188 PT Time Calculation (min): 29 min   Charges:   PT Evaluation $Initial PT Evaluation Tier I: 1 Procedure PT Treatments $Therapeutic Exercise: 8-22 mins   PT G Codes:          Ricardo Schubach 2014/03/27, 10:28 AM Pager [phone]

## 2014-03-25 NOTE — Progress Notes (Signed)
CARDIAC REHAB PHASE I   PRE:  Rate/Rhythm:  84 SR  BP:  Supine: 114/63  Sitting:   Standing:    SaO2: 96%RA  MODE:  Ambulation: 140 ft   POST:  Rate/Rhythm: 93 SR  BP:  Supine:   Sitting: 130/66  Standing:    SaO2: 99%RA 1324-1452 Pt walked 140 ft on RA with rolling walker and very little assistance. Gait steady. Pt stated he felt as SOB as yesterday but he increased distance today. Tolerated well. To recliner after walk.    Graylon Good, RN BSN  03/25/2014 1:48 PM

## 2014-03-25 NOTE — Progress Notes (Signed)
SUBJECTIVE:  SOB stable. Feeling better. Walked with PT yesterday and wants to walk with nurses today.   OBJECTIVE:   Vitals:   Filed Vitals:   03/24/14 1511 03/24/14 2029 03/24/14 2110 03/25/14 0627  BP: 119/64 119/72  128/62  Pulse: 81 85  84  Temp: 99 F (37.2 C) 99.4 F (37.4 C)  99.3 F (37.4 C)  TempSrc: Oral Oral  Oral  Resp: 18 18  17   Height:      Weight:    116 lb 2.9 oz (52.7 kg)  SpO2: 97% 100% 98% 95%   I&O's:    Intake/Output Summary (Last 24 hours) at 03/25/14 0843 Last data filed at 03/25/14 6301  Gross per 24 hour  Intake    600 ml  Output    600 ml  Net      0 ml   TELEMETRY: Reviewed telemetry pt in NSR:  PHYSICAL EXAM General: Well developed, well nourished, in no acute distress Head: Eyes PERRLA, No xanthomas.   Normal cephalic and atramatic  Lungs:   Scattered rhonchi Heart:   HRRR S1 S2 Pulses are 2+ & equal. Abdomen: Bowel sounds are positive, abdomen soft and non-tender without masses Extremities:   No clubbing, cyanosis or edema.  DP +1 Neuro: Alert and oriented X 3. Psych:  Good affect, responds appropriately   LABS: Basic Metabolic Panel:  Recent Labs  03/24/14 0425 03/25/14 0442  NA 143 145  K 3.9 3.9  CL 109 110  CO2 24 23  GLUCOSE 86 85  BUN 24* 23  CREATININE 1.56* 1.41*  CALCIUM 8.2* 8.2*    CBC:  Recent Labs  03/24/14 0425 03/25/14 0442  WBC 6.8 6.6  HGB 7.6* 7.9*  HCT 23.1* 23.1*  MCV 93.5 93.9  PLT 233 271   D-Dimer:  Recent Labs  03/22/14 1340  DDIMER 2.61*    Coag Panel:   Lab Results  Component Value Date   INR 1.15 03/13/2014   INR 1.30 05/12/2010   INR 1.05 04/25/2010    RADIOLOGY: Dg Chest 2 View  03/22/2014   CLINICAL DATA:  Short of breath  EXAM: CHEST  2 VIEW  COMPARISON:  None.  FINDINGS: Normal cardiac silhouette with ectatic aorta. There is chronic linear markings within the left and right lung. Multiple cavitary lesions are noted on comparison CT of 10/04/2013. Lungs are  hyperinflated. No pneumothorax.  IMPRESSION: No change in bilateral pulmonary opacities which represent cavitary lesions on comparison CT.  Hyperinflated lungs.  \  Dg Chest 2 View  03/13/2014   CLINICAL DATA:  Shortness of breath, cough, chest congestion, and chest pain. Wegener's granulomatosis.  EXAM: CHEST  2 VIEW  COMPARISON:  02/12/2014, 12/06/2013, 11/07/2013, 10/07/2013, and CT scan dated 10/04/2013  FINDINGS: The lungs are hyperinflated consistent with emphysema. There are multiple patchy areas of ill-defined infiltrate/ scarring as well as cavitary lesions in both lungs. The appearance is essentially unchanged. Heart size and pulmonary vascularity are normal. Large hiatal hernia, stable. No acute osseous abnormality.  IMPRESSION: Severe chronic lung disease with emphysema and multiple areas of scarring, cavitation, and patchy infiltrate consistent with Wegener's granulomatosis.  No significant change since 02/12/2014.     Dg Chest Port 1 View  03/19/2014   CLINICAL DATA:  Central line position.  Right internal jugular.  EXAM: PORTABLE CHEST - 1 VIEW  COMPARISON:  Chest radiograph of 03/13/2014 and CT of 10/04/2013.  FINDINGS: Right internal jugular line terminates at the high SVC. No pneumothorax.  Midline trachea. Mild cardiomegaly with atherosclerosis in the transverse aorta. No pleural fluid. Marked interstitial thickening with scattered reticular nodular opacities. Slightly upper lobe predominant. Overall slightly improved aeration, without new consolidation.  IMPRESSION: Right internal jugular line terminating at the high SVC, without pneumothorax.  Overall improved aeration with diffuse interstitial thickening and reticular nodular opacification. Presumably related to the clinical history of Wegener's granulomatosis. Atypical infection could look similar.  CTA on 03/23/14 IMPRESSION:  1. No evidence of a pulmonary embolus.  2. There are lung abnormalities with peribronchial interstitial    thickening most evident in the lower lobes, right middle lobe and  inferior left upper lobe, areas of mucous plugging in bronchi, and  other smaller areas of ill-defined focal airspace opacity. Acute  bronchitis with areas of multifocal pneumonia is suspected. The  bronchial mucous plugging and interstitial thickening may be chronic  with the small focal areas of lung opacity reflecting a more chronic  process such as organizing pneumonia.    Assessment/Plan  Principal Problem:  Unstable Class III Angina  Active Problems:  HYPOTHYROIDISM  HYPERLIPIDEMIA  ANEMIA  Wegener's granulomatosis  Chronic respiratory failure   Richard [name] is a 78 y.o. male with a history of hypothyroidism, HLD, anemia, Wegener's granulomatosis and no prior cardiac disease who presented to [facility] ED on 03/13/14 with chest pain and was transferred to [facility] for cath the following morning.  CAD: Abnormal nuclear stress test on 03/12/14: Intermediate risk with moderate region of apical and inferior scar with mild superimposed ischemia; EF 40%  -- s/p LHC on successful PCI to the LAD and POBA to a chronically occluded LCX. There remained high grade proximal disease to a large proximal Ramus branch as well as mid-RCA disease. This was complicated by right femoral artery pseudoaneurysm requiring repair. Now s/p PCI with rotablator atherectomy with BMS to the mid RCA. Due to length of fluoro time and large volume of contrast, it was decided to medically manage the ramus stenosis which Dr. Angelena Form felt was moderately stenosed and if he has recurrent anginal then consider PCI. Continue ASA/BB/Plavix/statin   Right femoral pseudoaneurysm: Day 5 s/p suture repair of femoral artery with evacuation of hematoma . Still with significant hematoma down buttox and hamstring , but CBC stable at 7.9. -- Needs followup with Dr. Kellie Simmering in 2 weeks   Anemia: subsequent to right femoral pseudoaneurysm. Post surgical repair. S/p blood  transfusion x 2. Hgb improved from 7.4 to 8.6. Stable at 7.9 today ( up from 7.6 yesterday)  SOB - BNP elevated at 4937. D-Dimer elevated at 2.6. CTA with no acute PE. No s/s of volume overload -- Though to be possibly due to anemia. Chest xray showed multiple cavitary lesions but no CHF. CTA with multiple abnormalities possibly reflecting acute bronchitis vs organizing PNA. PCCM (followed by Dr. [name]) consulted who felt that the CT was at his baseline and to continue current therapy. He thought his SOB was likely multifactorial and anemia also playing a role.  -- BNP elevated at 4937. D-Dimer elevated at 2.6. CTA with no acute PE. No s/s of volume overload  Chronic renal insufficiency, stage III (moderate).  -- Creat elevated but stable after contrast exposure with CTA yesterday. -- Creat stable at 1.41 today. Continue to monitor    Perry Mount, PA-C  03/25/2014  8:43 AM  I have examined the patient and reviewed assessment and plan and discussed with patient.  Agree with above as stated.  Appreciate pulmonary recs.  Plan for  pul rehab as an OP.  Anemia stable.  COntinue to walk with PT.  No need for OP PT.  Should be considering discharge if ok from pulmonary standpoint within the next day or so.  Stellan Vick S.

## 2014-03-26 ENCOUNTER — Telehealth: Payer: Self-pay | Admitting: Internal Medicine

## 2014-03-26 ENCOUNTER — Encounter (HOSPITAL_COMMUNITY): Payer: Self-pay | Admitting: Physician Assistant

## 2014-03-26 DIAGNOSIS — I724 Aneurysm of artery of lower extremity: Secondary | ICD-10-CM

## 2014-03-26 LAB — BASIC METABOLIC PANEL
Anion gap: 15 (ref 5–15)
BUN: 21 mg/dL (ref 6–23)
CALCIUM: 8.3 mg/dL — AB (ref 8.4–10.5)
CO2: 23 mEq/L (ref 19–32)
Chloride: 106 mEq/L (ref 96–112)
Creatinine, Ser: 1.48 mg/dL — ABNORMAL HIGH (ref 0.50–1.35)
GFR calc Af Amer: 52 mL/min — ABNORMAL LOW (ref >90)
GFR calc non Af Amer: 45 mL/min — ABNORMAL LOW (ref >90)
GLUCOSE: 83 mg/dL (ref 70–99)
Potassium: 3.8 mEq/L (ref 3.7–5.3)
SODIUM: 144 meq/L (ref 137–147)

## 2014-03-26 LAB — CBC
HCT: 22.9 % — ABNORMAL LOW (ref 39.0–52.0)
HEMOGLOBIN: 7.7 g/dL — AB (ref 13.0–17.0)
MCH: 31.8 pg (ref 26.0–34.0)
MCHC: 33.6 g/dL (ref 30.0–36.0)
MCV: 94.6 fL (ref 78.0–100.0)
Platelets: 309 10*3/uL (ref 150–400)
RBC: 2.42 MIL/uL — AB (ref 4.22–5.81)
RDW: 16.8 % — ABNORMAL HIGH (ref 11.5–15.5)
WBC: 7.2 10*3/uL (ref 4.0–10.5)

## 2014-03-26 MED ORDER — CLOPIDOGREL BISULFATE 75 MG PO TABS: 75.0000 mg | ORAL_TABLET | Freq: Every day | ORAL | Status: DC

## 2014-03-26 MED ORDER — PANTOPRAZOLE SODIUM 40 MG PO TBEC: 40.0000 mg | DELAYED_RELEASE_TABLET | Freq: Every day | ORAL | Status: DC

## 2014-03-26 MED ORDER — SODIUM CHLORIDE 0.9 % IJ SOLN
10.0000 mL | INTRAMUSCULAR | Status: DC | PRN
  Administered 2014-03-26: 20 mL

## 2014-03-26 MED ORDER — DOCUSATE SODIUM 100 MG PO CAPS
100.0000 mg | ORAL_CAPSULE | Freq: Two times a day (BID) | ORAL | Status: DC
  Filled 2014-03-26 (×2): qty 1

## 2014-03-26 MED ORDER — FERROUS SULFATE 325 (65 FE) MG PO TABS
325.0000 mg | ORAL_TABLET | Freq: Every day | ORAL | Status: DC
  Filled 2014-03-26 (×2): qty 1

## 2014-03-26 MED ORDER — FERROUS SULFATE 325 (65 FE) MG PO TABS
325.0000 mg | ORAL_TABLET | Freq: Every day | ORAL | Status: DC
Start: 2014-03-26 — End: 2014-07-17

## 2014-03-26 MED ORDER — BISOPROLOL FUMARATE 5 MG PO TABS: 2.5000 mg | ORAL_TABLET | Freq: Every day | ORAL | Status: DC

## 2014-03-26 MED ORDER — NITROGLYCERIN 0.4 MG SL SUBL: 0.4000 mg | SUBLINGUAL_TABLET | SUBLINGUAL | Status: DC | PRN

## 2014-03-26 MED ORDER — CYCLOPHOSPHAMIDE 50 MG PO TABS
50.0000 mg | ORAL_TABLET | Freq: Every day | ORAL | Status: DC
  Filled 2014-03-26: qty 1

## 2014-03-26 MED ORDER — DSS 100 MG PO CAPS: 100.0000 mg | ORAL_CAPSULE | Freq: Two times a day (BID) | ORAL | Status: DC

## 2014-03-26 MED ORDER — SIMVASTATIN 20 MG PO TABS: 20.0000 mg | ORAL_TABLET | Freq: Every day | ORAL | Status: DC

## 2014-03-26 NOTE — Telephone Encounter (Signed)
FYI  Transition of Care appointment scheduled for 8/25 with Cecilie Kicks NP per [facility].

## 2014-03-26 NOTE — Progress Notes (Signed)
SUBJECTIVE:  SOB stable. Feeling better. Ready to go home.   OBJECTIVE:   Vitals:   Filed Vitals:   03/25/14 2131 03/26/14 0554 03/26/14 0917 03/26/14 0923  BP: 116/58 121/69  117/59  Pulse: 86 80  91  Temp: 98.3 F (36.8 C) 98.6 F (37 C)  98.6 F (37 C)  TempSrc: Oral Oral  Oral  Resp: 16 17  20   Height:      Weight:  115 lb 4.8 oz (52.3 kg)    SpO2: 97% 94% 100% 100%   I&O's:    Intake/Output Summary (Last 24 hours) at 03/26/14 1057 Last data filed at 03/26/14 0847  Gross per 24 hour  Intake    600 ml  Output   1125 ml  Net   -525 ml   TELEMETRY: Reviewed telemetry pt in NSR:  PHYSICAL EXAM General: Well developed, well nourished, in no acute distress Head: Eyes PERRLA, No xanthomas.   Normal cephalic and atramatic  Lungs:   Scattered rhonchi Heart:   HRRR S1 S2 Pulses are 2+ & equal. Abdomen: Bowel sounds are positive, abdomen soft and non-tender without masses Extremities:   No clubbing, cyanosis or edema.  DP +1 Neuro: Alert and oriented X 3. Psych:  Good affect, responds appropriately   LABS: Basic Metabolic Panel:  Recent Labs  03/25/14 0442 03/26/14 0520  NA 145 144  K 3.9 3.8  CL 110 106  CO2 23 23  GLUCOSE 85 83  BUN 23 21  CREATININE 1.41* 1.48*  CALCIUM 8.2* 8.3*    CBC:  Recent Labs  03/25/14 0442 03/26/14 0520  WBC 6.6 7.2  HGB 7.9* 7.7*  HCT 23.1* 22.9*  MCV 93.9 94.6  PLT 271 309   D-Dimer: No results found for this basename: DDIMER,  in the last 72 hours  Coag Panel:   Lab Results  Component Value Date   INR 1.15 03/13/2014   INR 1.30 05/12/2010   INR 1.05 04/25/2010    RADIOLOGY: Dg Chest 2 View  03/22/2014   CLINICAL DATA:  Short of breath  EXAM: CHEST  2 VIEW  COMPARISON:  None.  FINDINGS: Normal cardiac silhouette with ectatic aorta. There is chronic linear markings within the left and right lung. Multiple cavitary lesions are noted on comparison CT of 10/04/2013. Lungs are hyperinflated. No pneumothorax.   IMPRESSION: No change in bilateral pulmonary opacities which represent cavitary lesions on comparison CT.  Hyperinflated lungs.  \  Dg Chest 2 View  03/13/2014   CLINICAL DATA:  Shortness of breath, cough, chest congestion, and chest pain. Wegener's granulomatosis.  EXAM: CHEST  2 VIEW  COMPARISON:  02/12/2014, 12/06/2013, 11/07/2013, 10/07/2013, and CT scan dated 10/04/2013  FINDINGS: The lungs are hyperinflated consistent with emphysema. There are multiple patchy areas of ill-defined infiltrate/ scarring as well as cavitary lesions in both lungs. The appearance is essentially unchanged. Heart size and pulmonary vascularity are normal. Large hiatal hernia, stable. No acute osseous abnormality.  IMPRESSION: Severe chronic lung disease with emphysema and multiple areas of scarring, cavitation, and patchy infiltrate consistent with Wegener's granulomatosis.  No significant change since 02/12/2014.     Dg Chest Port 1 View  03/19/2014   CLINICAL DATA:  Central line position.  Right internal jugular.  EXAM: PORTABLE CHEST - 1 VIEW  COMPARISON:  Chest radiograph of 03/13/2014 and CT of 10/04/2013.  FINDINGS: Right internal jugular line terminates at the high SVC. No pneumothorax.  Midline trachea. Mild cardiomegaly with atherosclerosis in the  transverse aorta. No pleural fluid. Marked interstitial thickening with scattered reticular nodular opacities. Slightly upper lobe predominant. Overall slightly improved aeration, without new consolidation.  IMPRESSION: Right internal jugular line terminating at the high SVC, without pneumothorax.  Overall improved aeration with diffuse interstitial thickening and reticular nodular opacification. Presumably related to the clinical history of Wegener's granulomatosis. Atypical infection could look similar.  CTA on 03/23/14 IMPRESSION:  1. No evidence of a pulmonary embolus.  2. There are lung abnormalities with peribronchial interstitial  thickening most evident in the  lower lobes, right middle lobe and  inferior left upper lobe, areas of mucous plugging in bronchi, and  other smaller areas of ill-defined focal airspace opacity. Acute  bronchitis with areas of multifocal pneumonia is suspected. The  bronchial mucous plugging and interstitial thickening may be chronic  with the small focal areas of lung opacity reflecting a more chronic  process such as organizing pneumonia.    Assessment/Plan  Principal Problem:  Unstable Class III Angina  Active Problems:  HYPOTHYROIDISM  HYPERLIPIDEMIA  ANEMIA  Wegener's granulomatosis  Chronic respiratory failure   [name] is a 78 y.o. male with a history of hypothyroidism, HLD, anemia, Wegener's granulomatosis and no prior cardiac disease who presented to [facility] ED on 03/13/14 with chest pain and was transferred to [facility] for cath the following morning.  CAD: Abnormal nuclear stress test on 03/12/14: Intermediate risk with moderate region of apical and inferior scar with mild superimposed ischemia; EF 40%  -- s/p LHC on successful PCI to the LAD and POBA to a chronically occluded LCX. There remained high grade proximal disease to a large proximal Ramus branch as well as mid-RCA disease. This was complicated by right femoral artery pseudoaneurysm requiring repair. Now s/p PCI with rotablator atherectomy with BMS to the mid RCA. Due to length of fluoro time and large volume of contrast, it was decided to medically manage the ramus stenosis which Dr. Angelena Form felt was moderately stenosed and if he has recurrent anginal then consider PCI. Continue ASA/BB/Plavix/statin   Right femoral pseudoaneurysm: Day 5 s/p suture repair of femoral artery with evacuation of hematoma . Still with significant hematoma down buttox and hamstring , but CBC stable at 7.7. -- Needs followup with Dr. Kellie Simmering in 2 weeks   Anemia: subsequent to right femoral pseudoaneurysm. Post surgical repair. S/p blood transfusion x 2. Hgb improved from 7.4 to  8.6. Stable at 7.7  SOB - BNP elevated at 4937. D-Dimer elevated at 2.6. CTA with no acute PE. No s/s of volume overload -- Though to be possibly due to anemia. Chest xray showed multiple cavitary lesions but no CHF. CTA with multiple abnormalities possibly reflecting acute bronchitis vs organizing PNA. PCCM (followed by Dr. [name]) consulted who felt that the CT was at his baseline and to continue current therapy. He thought his SOB was likely multifactorial and anemia also playing a role.  -- BNP elevated at 4937. D-Dimer elevated at 2.6. CTA with no acute PE. No s/s of volume overload  Wegener's granulomatosis. -- Continue cytoxan, prednisone  -- Bactrim for PCP prophylaxis -- Will need pulmonary rehab as an outpatient  Chronic renal insufficiency, stage III (moderate).  -- Creat elevated but stable after contrast exposure for CTA  -- Creat stable at 1.48 today. Continue to monitor  Discharge-  tomorrow likely. Depends of PCCM evaluation and work up.   Richard Mount, PA-C  03/26/2014  10:57 AM  I have examined the patient and reviewed assessment and plan  and discussed with patient.  Agree with above as stated.  Discharge today.  Pulmonary signed off.  Patient feels ready to go.  D/C tele, D/c central line.  Would send home on Iron 324 mg daily with colace 100 mg BID to help prevent constipation.  F/u with Dr. Debara Pickett.  Braylynn Ghan S.

## 2014-03-26 NOTE — Progress Notes (Signed)
Physical Therapy Treatment Patient Details Name: Richard [name] MRN: [id number] DOB: [birth year] Today's Date: 03/26/2014    History of Present Illness Adm 03/13/14 with chest pain; cardiac cath 8/7 with severe 4 vessel disease; pulmonary consult not a good surgical candidate for CABG; 8/10 & 8/14 cardiac cath PCI and atherectomy; 8/12 Rt femoral pseudoanerysm repair  PMHx-Wegener's granulomatosis, HTN, HOH    PT Comments    Pt has met goals, is mod I with activity, recommended that he use RW for ambulation upon initial return home and reviewed HEP for improving balance. PT signing off.  Follow Up Recommendations  No PT follow up     Equipment Recommendations  None recommended by PT    Recommendations for Other Services       Precautions / Restrictions Precautions Precautions: None Restrictions Weight Bearing Restrictions: No    Mobility  Bed Mobility Overal bed mobility: Modified Independent                Transfers Overall transfer level: Modified independent Equipment used: None Transfers: Sit to/from Stand Sit to Stand: Modified independent (Device/Increase time)         General transfer comment: transferring safely without AD  Ambulation/Gait Ambulation/Gait assistance: Supervision Ambulation Distance (Feet): 200 Feet Assistive device: None Gait Pattern/deviations: Step-through pattern;Trunk flexed Gait velocity: decreased   General Gait Details: pt ambulated with RW this morning with cardiac rehab and now without RW. Slightly unsteady without AD, recommend using RW for initial mobility upon d/c, pt agreeable. Pt tends to look at floor during ambulation, vc's for gaze direction.    Stairs Stairs: Yes Stairs assistance: Supervision Stair Management: One rail Left;Step to pattern;Forwards Number of Stairs: 3 General stair comments: supervision with steps, pt reports he does not have rail but can reach door and steps are very low  Wheelchair  Mobility    Modified Rankin (Stroke Patients Only)       Balance Overall balance assessment: Needs assistance Sitting-balance support: No upper extremity supported;Feet supported Sitting balance-Leahy Scale: Normal     Standing balance support: No upper extremity supported;During functional activity Standing balance-Leahy Scale: Good Standing balance comment: gave standing balance exercises to perform at home at counter including unilateral stance with 1 hand support and standing with eyes closed                     Cognition Arousal/Alertness: Awake/alert Behavior During Therapy: WFL for tasks assessed/performed Overall Cognitive Status: Within Functional Limits for tasks assessed                      Exercises      General Comments        Pertinent Vitals/Pain Pain Assessment: No/denies pain HR 108 bpm with ambulation    Home Living                      Prior Function            PT Goals (current goals can now be found in the care plan section) Acute Rehab PT Goals Patient Stated Goal: perhaps return to bowling PT Goal Formulation: With patient Time For Goal Achievement: 04/01/14 Potential to Achieve Goals: Good Progress towards PT goals: Goals met/education completed, patient discharged from PT    Frequency  Min 3X/week    PT Plan Current plan remains appropriate    Co-evaluation             End of Session  Activity Tolerance: Patient tolerated treatment well Patient left: in chair;with call bell/phone within reach;with nursing/sitter in room;with family/visitor present     Time: 0413-6438 PT Time Calculation (min): 12 min  Charges:  $Gait Training: 8-22 mins                    G Codes:     .Leighton Roach, PT  Acute Rehab Services  [place], [place] 03/26/2014, 3:14 PM

## 2014-03-26 NOTE — Progress Notes (Signed)
Name: Richard [name] MRN: [id number] DOB: 1935-09-10    ADMISSION DATE:  03/13/2014 CONSULTATION DATE:  8/17  REFERRING MD :  Hilty  PRIMARY SERVICE:  Cardiology   CHIEF COMPLAINT: abnormal CT findings   BRIEF PATIENT DESCRIPTION:  78 yo male admitted with chest pain/fatigue.  He has hx of BTX and Wegener's followed by Dr. [name].  He was tx with cytoxan/prednisone in 2012.  His tx with cytoxan was restarted in January 2015.  SIGNIFICANT EVENTS: 8/6 admitted for CP 8/7 cardiac cath 4V disease. Felt needed CABG 8/8 seen by [name] (follows out pt) for pulm clearance. Michela Pitcher would prefer PCI and CABG Only if absolutely necessary as functional status poor and risk of post-op pulm complications high. ---> turned down by CVTS.  8/10 successful PCI to the LAD and POBA to a chronically occluded LCX 8/10. There remained high grade proximal disease to a large proximal Ramus branch as well as mid-RCA disease. 8/10 femoral pseudoaneurysm 8/12 pseudoaneurysm  repaired by vascular   8/14 PCI with rotablator atherectomy with BMS to the mid RCA  STUDIES:  08/19/13 C-ANCA >> 1:80 November 01, 2013 CT chest >> extensive multifocal BTX with thin walled cavitary nodules 03/14/14 PFT >> FEV1 1.26 (47%), FEV1% 48, DLCO 45% 03/15/14 Echo >> EF 40 to 16%, grade 1 diastolic dysfx 0/10/93 CT chest >> lower lobe predominant interstitial thickening with mucus plugs, moderate HH  SUBJECTIVE:  Not as much cough or sputum.  VITAL SIGNS: Temp:  [98.3 F (36.8 C)-98.9 F (37.2 C)] 98.6 F (37 C) (08/19 0923) Pulse Rate:  [80-91] 91 (08/19 0923) Resp:  [16-20] 20 (08/19 0923) BP: (115-121)/(58-76) 117/59 mmHg (08/19 0923) SpO2:  [94 %-100 %] 100 % (08/19 0923) Weight:  [115 lb 4.8 oz (52.3 kg)] 115 lb 4.8 oz (52.3 kg) (08/19 0554)  PHYSICAL EXAMINATION: General:  Chronically ill appearing man, NAD Neuro:  Alert and interactive, moves all ext to command. HEENT:  No LAN/JVD Cardiovascular:  RRR, Nl S1/S2,  -M/R/G. Lungs:  Diminished in bases, on room air Abdomen:  Soft, NT, ND and +BS. Musculoskeletal:  -edema and -tenderness. Skin:  Rt groin surgical site with staples, ecchymosis, 2+ bilat pedal pulses.   Recent Labs Lab 03/24/14 0425 03/25/14 0442 03/26/14 0520  NA 143 145 144  K 3.9 3.9 3.8  CL 109 110 106  CO2 24 23 23   BUN 24* 23 21  CREATININE 1.56* 1.41* 1.48*  GLUCOSE 86 85 83    Recent Labs Lab 03/24/14 0425 03/25/14 0442 03/26/14 0520  HGB 7.6* 7.9* 7.7*  HCT 23.1* 23.1* 22.9*  WBC 6.8 6.6 7.2  PLT 233 271 309   Urinalysis    Component Value Date/Time   COLORURINE YELLOW 03/25/2014 1637   APPEARANCEUR CLEAR 03/25/2014 1637   LABSPEC 1.017 03/25/2014 1637   PHURINE 5.5 03/25/2014 1637   GLUCOSEU NEGATIVE 03/25/2014 1637   GLUCOSEU NEGATIVE 07/26/2013 1142   HGBUR TRACE* 03/25/2014 1637   HGBUR trace-intact 11/13/2008 0000   BILIRUBINUR NEGATIVE 03/25/2014 1637   BILIRUBINUR neg 05/16/2012 1300   KETONESUR NEGATIVE 03/25/2014 1637   PROTEINUR 30* 03/25/2014 1637   PROTEINUR trace 05/16/2012 1300   UROBILINOGEN 0.2 03/25/2014 1637   UROBILINOGEN 0.2 05/16/2012 1300   NITRITE NEGATIVE 03/25/2014 1637   NITRITE neg 05/16/2012 1300   LEUKOCYTESUR NEGATIVE 03/25/2014 1637      Impression:  78 yo male with hx of BTX, COPD, and Wegener's on chronic cytoxan/prednisone with bactrim PCP prophylaxis.  He has changes on CT  chest from 03/23/14 as detailed above >> appearance is improved compared to February 2015.  He has persistent dyspnea >> likely multifactorial from CAD, deconditioning, anemia, COPD/BTX with Wegener's.  Wegener's granulomatosis. Plan: Continue cytoxan, prednisone Bactrim for PCP prophylaxis  COPD, BTX, productive cough. Plan: Continue symbicort with prn xopenex Continue bronchial hygiene  CAD, systolic heart failure. Plan: Per cardiology  Anemia of chronic disease. Plan: F/u CBC  CKD. Plan: Monitor renal fx  Deconditioning. Plan: Likely  would benefit from pulmonary rehab as outpt   Richardson Landry Minor ACNP Maryanna Shape PCCM Pager [phone] till 3 pm If no answer page [phone] 03/26/2014, 10:40 AM   Updated pt's wife at bedside.  He is on stable pulmonary regimen.  PCCM will sign off.  They will contact pulmonary office after d/c to arrange for outpt follow up with Dr. [name].  Please call if additional help needed while pt is in hospital.  Chesley Mires, MD [place] 03/26/2014, 11:31 AM Pager:  [phone] After 3pm call: [phone]

## 2014-03-26 NOTE — Discharge Summary (Signed)
Discharge Summary   Patient ID: Richard [name] MRN: [id number], DOB/AGE: May 14, 1936 78 y.o. Admit date: 03/13/2014 D/C date:     03/26/2014  Primary Cardiologist: Dr. Debara Pickett  Principal Problem:   Unstable Class III Angina Active Problems:   HYPOTHYROIDISM   HYPERLIPIDEMIA   ANEMIA   Wegener's granulomatosis   Chronic respiratory failure   Abnormal nuclear stress test: Intermediate risk with moderate region of apical and inferior scar with mild superimposed ischemia; EF 40%   CAD- severe 4 V CAD- not CABG candidate   Anemia- transfused 03/16/14   Chronic renal insufficiency, stage III (moderate)   Cardiomyopathy, ischemic- EF 40% by Myoview   Acute blood loss anemia   Femoral artery pseudo-aneurysm, right   Admission Dates: 03/13/14 - 03/26/14 Discharge Diagnosis: [place] s/p [facility] x3 and ultimately BMS to pLAD& POBA to CTO of circumflex/marginal vessel on 03/17/14 and staged BMS to mRCA on 03/21/14  HPI: Richard [name] is a 78 y.o. male with a history of hypothyroidism, HLD, anemia, Wegener's granulomatosis and no prior cardiac disease who presented to [facility] ED on 03/13/14 with chest pain and was transferred to [facility] for cath the following morning.  PMH significant for Wegener's granulomatosis and follows with Dr. [name] for his pulmonary disease (severe multifocal bronchiectasis as well as numerous cavitary and non-cavitary nodules). He had been having chest tightness with significant exertion for the last few months with increasing dyspnea on exertion. Also had 2 brothers who both had CABG. Referred to Dr. Debara Pickett who proceeded with nuclear stress testing which demonstrated a "moderate region of apical and inferior scar with mild superimposed qualitative ischemia".  LV wall motion demonstrated a "moderate degree of hypokinesis involving the mid-distal inferior wall and apex with an EF of 40%".  Prior echo dated 03/30/2010 showed EF 45-50%.   Hospital Course: He presented to [facility] ED with increasing fatigue  and chest tightness which developed that morning, which resolved within 10 minutes.  He was given ASA, heparin, and nitro paste. ECG demonstrated sinus tachycardia with no acute ST-T abnormalities. POC troponin normal. Also noted to be anemic, Hgb 9 (10 on 12/06/13). Dr. [name] noted that Richard [name] has had hemoptysis in the past, therefore limiting DAPT was ideal and BMS over DES thought to be the best option.  [place]/CAD:  -- Abnormal outpatient nuclear stress test on 03/12/14: Intermediate risk with moderate region of apical and inferior scar with mild superimposed ischemia; EF 40% -- s/p first LHC on 03/14/14 which revealed severe multivessel CAD involving all 4 major branches. Seen by Dr. Madolyn Frieze on this admission who deemed him unsuitable for CABG due to underlying comorbid conditions. ( sts score >30). It was elected to proceed with staged multivessel PCI .  -- s/p LHC on 03/17/14 with successful PCI to the LAD and POBA to a chronically occluded LCX. There remained high grade proximal disease to a large proximal Ramus branch as well as mid-RCA disease so staged PCI was planned at a later date due to pulmonary issues and renal dysfunction. This was further complicated by right femoral artery pseudoaneurysm requiring repair. LHC on 03/21/14 s/p PCI with rotablator atherectomy with BMS to the mid RCA. Due to length of fluoro time and large volume of contrast, it was decided to medically manage the ramus stenosis which Dr. Angelena Form felt was moderately stenosed and if he has recurrent anginal then consider PCI.  -- Continue ASA/BB/Plavix/statin   Right femoral pseudoaneurysm: s/p suture repair of femoral artery with evacuation of  hematoma on 03/19/14. Still with significant hematoma down buttox and hamstring but improving.  CBC stable at 7.7.  -- Has follow up with Dr. Kellie Simmering on 04/08/2014    Anemia: subsequent to right femoral pseudoaneurysm- s/p surgical repair. S/p blood transfusion x 2. Hgb improved from 7.4 to  8.6. Stable at 7.7 on discharge.  --Will send home on Iron 324 mg daily with colace 100 mg BID to help prevent constipation   SOB - BNP elevated at 4937. D-Dimer elevated at 2.6. CTA with no acute PE. No s/s of volume overload  -- Though to be possibly due to anemia. Chest xray showed multiple cavitary lesions but no CHF. CTA with multiple abnormalities possibly reflecting acute bronchitis vs organizing PNA. PCCM (followed by Dr. [name]) consulted who felt that the CT was at his baseline and to continue current therapy. He thought his SOB was likely multifactorial and anemia also playing a role.  -- BNP elevated at 4937. D-Dimer elevated at 2.6. CTA with no acute PE. No s/s of volume overload    Chronic systolic CHF- 2 D ECHO with LVEF 40-45%, mid to distal inferoapical and apical hypokinesis, diastolic dysfunction with elevated LV filling pressure, mild to moderate RV hypokinesis.  Wegener's granulomatosis. ( per PCCM) -- Continue cytoxan, prednisone  -- Bactrim for PCP prophylaxis  -- Will need pulmonary rehab as an outpatient   Chronic renal insufficiency, stage III (moderate).  -- Creat elevated but stable after contrast exposure for CTA  -- Creat stable at 1.48 today. Continue to monitor  The patient has had a prolonged hospital course but is recovering well. The femoral catheter site is stable. He has been seen by Dr. Irish Lack today and deemed ready for discharge home. All follow-up appointments have been scheduled.  Discharge medications are listed below.    Discharge Vitals: Blood pressure 109/62, pulse 97, temperature 98.7 F (37.1 C), temperature source Oral, resp. rate 18, height _0  (1.702 m), weight 115 lb 4.8 oz (52.3 kg), SpO2 100.00%.  Labs: Lab Results  Component Value Date   WBC 7.2 03/26/2014   HGB 7.7* 03/26/2014   HCT 22.9* 03/26/2014   MCV 94.6 03/26/2014   PLT 309 03/26/2014     Recent Labs Lab 03/26/14 0520  NA 144  K 3.8  CL 106  CO2 23  BUN 21    CREATININE 1.48*  CALCIUM 8.3*  GLUCOSE 83    Lab Results  Component Value Date   CHOL 112 03/14/2014   HDL 42 03/14/2014   LDLCALC 58 03/14/2014   TRIG 61 03/14/2014   Lab Results  Component Value Date   DDIMER 2.61* 03/22/2014    Diagnostic Studies/Procedures   Dg Chest 2 View  03/22/2014   CLINICAL DATA:  Short of breath  EXAM: CHEST  2 VIEW  COMPARISON:  None.  FINDINGS: Normal cardiac silhouette with ectatic aorta. There is chronic linear markings within the left and right lung. Multiple cavitary lesions are noted on comparison CT of 10/04/2013. Lungs are hyperinflated. No pneumothorax.  IMPRESSION: No change in bilateral pulmonary opacities which represent cavitary lesions on comparison CT.  Hyperinflated lungs.     Dg Chest 2 View  03/13/2014   CLINICAL DATA:  Shortness of breath, cough, chest congestion, and chest pain. Wegener's granulomatosis.  EXAM: CHEST  2 VIEW  COMPARISON:  02/12/2014, 12/06/2013, 11/07/2013, 10/07/2013, and CT scan dated 10/04/2013  FINDINGS: The lungs are hyperinflated consistent with emphysema. There are multiple patchy areas of ill-defined infiltrate/ scarring as  well as cavitary lesions in both lungs. The appearance is essentially unchanged. Heart size and pulmonary vascularity are normal. Large hiatal hernia, stable. No acute osseous abnormality.  IMPRESSION: Severe chronic lung disease with emphysema and multiple areas of scarring, cavitation, and patchy infiltrate consistent with Wegener's granulomatosis.  No significant change since 02/12/2014.     Ct Angio Chest Pe W/cm &/or Wo Cm  03/23/2014   CLINICAL DATA:  Elevated D-dimer.  EXAM: CT ANGIOGRAPHY CHEST WITH CONTRAST  TECHNIQUE: Multidetector CT imaging of the chest was performed using the standard protocol during bolus administration of intravenous contrast. Multiplanar CT image reconstructions and MIPs were obtained to evaluate the vascular anatomy.  CONTRAST:  100 mL of Omnipaque 350 intravenous  contrast.  COMPARISON:  Current chest radiograph.  Chest CT dated 10/04/13  FINDINGS: Study somewhat limited by motion in the lung bases limiting evaluation for PE in the smaller segmental and subsegmental vessels. Allowing for this, there is no evidence of pulmonary embolism.  Heart is normal in size. There are dense coronary artery calcifications. Great vessels are normal in caliber for age. No aortic dissection. No neck base, axillary, mediastinal or hilar masses or pathologically enlarged lymph nodes.  There is peribronchial interstitial thickening most evident in the lower lobes, right middle lobe and lingula of the left upper lobe. There are multiple areas of mucous plugging distending bronchi, most prominently in the left lower lobe. There are additional areas of patchy airspace opacity in the lungs, most prominently in the posterior medial right lower lobe and lateral left upper lobe. Additional areas of peripheral reticular opacity are likely due to subsegmental atelectasis. Minimal right pleural effusion. No pneumothorax.  Limited evaluation of the upper abdomen shows a moderate hiatal hernia but is otherwise unremarkable. No significant bony abnormality.  Review of the MIP images confirms the above findings.  IMPRESSION: 1. No evidence of a pulmonary embolus. 2. There are lung abnormalities with peribronchial interstitial thickening most evident in the lower lobes, right middle lobe and inferior left upper lobe, areas of mucous plugging in bronchi, and other smaller areas of ill-defined focal airspace opacity. Acute bronchitis with areas of multifocal pneumonia is suspected. The bronchial mucous plugging and interstitial thickening may be chronic with the small focal areas of lung opacity reflecting a more chronic process such as organizing pneumonia.      Dg Chest Port 1 View  03/19/2014   CLINICAL DATA:  Central line position.  Right internal jugular.  EXAM: PORTABLE CHEST - 1 VIEW  COMPARISON:   Chest radiograph of 03/13/2014 and CT of 10/04/2013.  FINDINGS: Right internal jugular line terminates at the high SVC. No pneumothorax.  Midline trachea. Mild cardiomegaly with atherosclerosis in the transverse aorta. No pleural fluid. Marked interstitial thickening with scattered reticular nodular opacities. Slightly upper lobe predominant. Overall slightly improved aeration, without new consolidation.  IMPRESSION: Right internal jugular line terminating at the high SVC, without pneumothorax.  Overall improved aeration with diffuse interstitial thickening and reticular nodular opacification. Presumably related to the clinical history of Wegener's granulomatosis. Atypical infection could look similar.      03/21/14: Cardiac Catheterization Operative Report  Richard [name]  [id number]  8/14/201510:50 AM  [name], MD  Procedure Performed:  1. PTCA/bare metal stent x 1 mid RCA  2. Rotablator atherectomy RCA  3. Placement temporary transvenous pacemaker  Operator: Lauree Chandler, MD  Arterial access site: Right radial artery.  Indication: 78 yo male with history of CAD, cardiomyopathy here today for staged PCI  of the RCA and Ramus Intermediate branch. He was found to have multi-vessel CAD by Dr. Ellyn Hack last week. He was not felt to be a good candidate for bypass and was turned down by CT surgery. LAD treated with bare metal stent 03/17/14 by Dr. Claiborne Billings. Circumflex treated with POBA 03/17/14 by Dr. Claiborne Billings. The plan per the primary team has been for staged PCI of the RCA and Ramus.  Procedure Details:  The risks, benefits, complications, treatment options, and expected outcomes were discussed with the patient. The patient and/or family concurred with the proposed plan, giving informed consent. The patient was brought to the cath lab after IV hydration was begun and oral premedication was given. The patient was further sedated with Versed and Fentanyl. The right wrist was assessed with a reverse  Allens test which was positive. The right wrist was prepped and draped in a sterile fashion. 1% lidocaine was used for local anesthesia. Using the modified Seldinger access technique, a 5 French sheath was placed in the right radial artery. 3 mg Verapamil was given through the sheath. He was given a bolus of Angiomax and a drip was started. I then engaged the RCA with a JR4 guiding catheter. I had a difficult time crossing the tandem mid stenoses with a wire. I then placed a 2.0 x 12 OTW balloon in the mid RCA and attempted to cross with a Fielder XT wire. This wire would not cross. I then was able to get a Whisper wire across the stenosis but the balloon would not pass over the wire beyond the stenosis. I inflated the 2.0 mm balloon in the more proximal stenosis but the anatomy would not allow me to pass the balloon further. I could not pass a 1.5 mm balloon across but tried to put the nose of the balloon in the lesion and inflated x 2. I then tried to use a FineCross to cross the lesion. This would not cross so I passed a Rotafloppy wire across the lesion and removed the FineCross. We then set up for an ad hoc rotablator. The left groin was prepped and draped in a sterile fashion and a 6 French sheath was placed in the left femoral vein. I then passed a temporary pacing wire into the RV. The rotablator was then placed on the table. I made three passes with a 1.25 mm burr. I then inflated a 2.0 x 12 mm balloon x 3 in the mid vessel. A 2.5 x 28 mm [place] bare metal stent was deployed in the mid vessel. The stent was post-dilated with a 2.75 x 20 mm [place] balloon x 2. The stenosis was taken from 99% down to 0%. The sheath was removed from the right radial artery and a Terumo hemostasis band was applied at the arteriotomy site on the right wrist.  There were no immediate complications. The patient was taken to the recovery area in stable condition.  Hemodynamic Findings:  Central aortic pressure: 124/69    Impression:  1. Unstable angina with ongoing dyspnea and chest pain following PCI of the LAD and OM.  2. Successful rotablator atherectomy, placement bare metal stent mid RCA  3. Moderate stenosis proximal ramus intermediate branch  Recommendations: Continue ASA and Plavix for at least one month. I had used 40 minutes of fluoro time and a large volume of contrast. I did not perform angiography of the intermediate branch which appeared to have moderate stenosis on the diagnostic films from last week. Would favor medical management  of intermediate branch for now and if he has recurrent angina, consider PCI of intermediate branch then.  Complex prolonged procedure due to calcification of vessel, severe stenoses and inability to pass balloons across the stenosis.  Complications: None. The patient tolerated the procedure well.     03/17/14 PERCUTANEOUS CORONARY INTERVENTION  HISTORY:  Richard [name] is a 78 y.o. male with Wegener's granulomatosis, as well as severe multifocal bronchiectasis, who had undergone cardiac catheterization after an abnormal stress study, which demonstrated three-vessel disease.  If his underlying medical conditions, CABG surgery was not recommended. He is now referred for multi-vessel coronary intervention with plans today to perform PCI to his LAD and circumflex with ultimate stage PCI to his RCA. The patient does have significant anemia and required recent blood cell transfusions. Bare-metal stenting was advised.  PROCEDURE: Two-vessel percutaneous coronary intervention: Angiosculpt/bare-metal stenting of the LAD; PTCA of a chronic totally occluded circumflex marginal vessel.  The patient was brought to the [facility] cardiac catherization labaratory in the postabsorptive state. He was premedicated initially with Versed 1 mg and fentanyl 25 mcg, but received 2 additional doses during the procedure. His right groin was prepped and shaved in usual sterile fashion.  Xylocaine 1% was used for local anesthesia. A 6 French sheath was inserted into the R femoral artery. A 6 French XB LAD 3.5 guide was inserted. Angiomax bolus plus infusion was administered and the patient received 600 mg of oral Plavix. A pro-water wire was advanced down the LAD across the proximal stenosis and positioned proximal to the diffuse distal disease which was not to be intervened upon. Due to significant calcification proximally and Angiosculpt 2.5x10 mm scoring balloon was utilized and 3 inflations up to 9 atmospheres was were performed. A Multi-link Vsion 3.0 .x18 mm bare metal stent was then deployed at 11 atmospheres. Post stent dilatation was done with an [place] Euphora balloon 3.25x15 mm at 13 atmospheres corresponding to 3.23 mm size. Scout angiography confirmed an excellent angiographic result. Attention was then directed at the left circumflex vessel. The hilar wire was then advanced down the circumflex vessel, but this was unable to cross the total occlusion of the marginal branch. A Choice PT moderate support wire was then inserted and with balloons support with a 2.0x12 mm balloon, the total occlusion was able to be crossed and the wire was advanced into the marginal branch. Multiple inflations at 3, 6, 7, 7, 7, and 8 atmospheres were made throughout the area of chronic occlusion. This corresponded to a 2.1 mm size. It was felt that the vessel size was too small to stent. Angiography confirmed an excellent angiographic result with resumption of brisk antegrade TIMI-3 flow to the AV groove circumflex and marginal branch. The arterial sheath was sutured in place with plans for sheath removal following completion of bivalirudin. The patient left the catheterization laboratory, chest pain-free with stable hemodynamics. The  HEMODYNAMICS:  Central Aorta: 119/66  ANGIOGRAPHY:  LAD: The LAD was calcified and there was focal stenosis of 95% proximally, just after the septal perforating artery. The  distal LAD was a diffusely diseased small caliber vessel after a distal diagonal vessel. Following scoring balloon dilatation with the Angiosculpt catheter and bare-metal stenting with a 3.0x18 mm Vision stent, postdilated to 3.23 mm, the 95% stenosis was reduced to 0%. There was brisk TIMI-3 flow. There was no evidence for dissection.  Left circumflex: The circumflex marginal vessel was totally occluded. There was left to left collateralization of the distal marginal and  AV groove branch and a gap was present between the proximal vessel and collateralized vessels. Following PTCA, he 100% occlusion was reduced to approximately 5%. There was no stenosis in the superior branch, but there was 50% ostial narrowing in the AV groove-like branch.  Total contrast used: Approximately 120 cc Omnipaque  IMPRESSION:  Successful two-vessel coronary intervention with Angiosculpt/bare-metal stenting of the proximal LAD with a 95% stenosis being reduced to 0% (3.0x18 mm Vision stent post dilated to 3.2, 3 mm) and PTCA of a chronically occluded circumflex marginal vessel.  RECOMMENDATION:  The patient will be aggressively hydrated. Following this, two-vessel intervention. The omental stenting was used due to the patient's significant recent anemia and his Wegener's granulomatosis. Plans are for staged PCI to his RCA proximally 48 hours if renal function allows.    03/14/14 CARDIAC CATHETERIZATION REPORT  NAME: Richard [name] MRN: [id number]  DOB: 1936/01/11 ADMIT DATE: 03/13/2014  Procedure Date: 03/14/2014  INTERVENTIONAL CARDIOLOGIST: Leonie Man, M.D., MS  PRIMARY CARE PROVIDER: [name], MD  PRIMARY CARDIOLOGIST: Hilty, Kenneth C. ([place]), M.D., [facility]  PATIENT: Richard [name] is a 78 y.o. male with a PMH significant for Wegener's Granulomatosis (followed by Dr. [name] for his severe multifocal bronchiectasis as well as numerous cavitary and non-cavitary nodules) as well as a family history of CAD with 2  brothers have a history of CABG.  He was referred to Dr. Debara Pickett for symptoms of chest tightness with significant exertion for the last few months with increasing dyspnea on exertion.  Dr. Debara Pickett ordered a nuclear stress test which demonstrated a "moderate region of apical and inferior scar with mild superimposed qualitative ischemia".  LV wall motion demonstrated a "moderate degree of hypokinesis involving the mid-distal inferior wall and apex with an EF of 40%".  Prior echo dated 03/30/2010 showed EF 45-50%.  Before he could be contacted with the results of this study, he presented to [facility] ED with increasing fatigue and chest tightness which developed this morning, which resolved within 10 minutes.  He was transferred from [facility] to [facility] for concerning symptoms of unstable angina. He is now referred for cardiac catheterization. He has a baseline creatinine of 1.8 and history of hemoptysis from Wegener's.  PRE-OPERATIVE DIAGNOSIS:  Class III Unstable Angina  Abnormal, Intermediate Risk Nuclear Stress Test PROCEDURES PERFORMED:  Left Heart Catheterization with Native Coronary Angiography via Right Radial Artery  PROCEDURE: The patient was brought to the [facility]atheter[facility] Lab in the fasting state and prepped and draped in the usual sterile fashion for Right Radial artery access. A modified Allen's test was performed on the right wrist demonstrating adequate collateral flow for radial access. Sterile technique was used including antiseptics, cap, gloves, gown, hand hygiene, mask and sheet. Skin prep: Chlorhexidine.  Consent: Risks of procedure as well as the alternatives and risks of each were explained to the (patient/caregiver). Consent for procedure obtained.  Time Out: Verified patient identification, verified procedure, site/side was marked, verified correct patient position, special equipment/implants available, medications/allergies/relevent history  reviewed, required imaging and test results available. Performed.  Access:  Right Radial Artery: 6 Fr Sheath - modified Seldinger Technique (Angiocath Micropuncture Kit)  Radial Cocktail - 10 mL; IV Heparin 3000 Units  Left Heart Catheterization: 5 Fr Catheters advanced or exchanged over a long exchange J-wire; JR 4 catheter advanced first.  Right Coronary Artery Cineangiography: JR 4 Catheter  LV Hemodynamics: JR 4  Left Coronary Artery Cineangiography: JL 3.5 Catheter  Sheath  removed in the Cath Lab with TR band placement for hemostasis.  TR Band: 1300 Hours; 15 mL air  FINDINGS:  Hemodynamics:  Central Aortic Pressure / Mean: 83/53/65 mmHg  Left Ventricular Pressure / LVEDP: 82/1/2 mmHg Left Ventriculography: Deferred to conserve contrast  Coronary Anatomy:  Dominance: Right Left Main: Large caliber mildly calcified vessel that trifurcates into the LAD, Ramus Intermedius, and Circumflex. No significant lesions. LAD: Normal caliber vessel with a very proximal small diagonal brainstem is major septal perforator. In its proximal segment there is a focal eccentric roughly 90% calcified appearing stenosis. The vessel then normalizes somewhat until just after a mid small diagonal branch. At this point the vessel appeared to be subtotally occluded with almost collateral appearing distal TIMI 1 flow.  There are several proximal septal perforators it provides collaterals to the right posterior descending artery (RPDA).  D1: Mid vessel branch is diffusely diseased but nothing significant. Left Circumflex: Moderate caliber, nondominant vessel that gives rise to a very small AV groove branch that perfuses collaterals to the right posterior lateral system before terminating as a lateral OM it bifurcates into OM1 and 2. In the mid vessel just prior to the bifurcation the vessel is 100% subtotally occluded with TIMI 1 antegrade versus retrograde flow from collaterals perfusing both branches of the OM. There  is a short segment of occlusion. The distal branches are small to moderate caliber with the superior branch being significant and providing collaterals to the right posterior lateral system  Ramus intermedius: Large caliber vessel with what appears to be a proximal 60-80% stenosis is somewhat focal. The remainder the vessel is perhaps the best angiographic vessel noted. It courses down to the apex, with minimal disease beyond the proximal lesion  RCA: Large caliber, dominant vessel with diffuse disease in the mid vessel prior to the takeoff of a major RV marginal branch that actually courses across as a posterior lateral branch that has competitive flow from left to right collaterals. The mid RCA has sequential 80-95% stenoses covering roughly 20-25 mm region. Beyond the most distal portion of the lesions, the vessel normalizes prior to the takeoff of the RV marginal branch and then bifurcates distally into the Right Posterior AV Groove Branch (RPAV) and Right Posterior Descending Artery. Beyond the mid RCA severe disease, there minimal disease distally. MEDICATIONS:  Anesthesia: Local Lidocaine 2 ml Sedation: 1 mg IV Versed, 25 mcg IV fentanyl ;  Omnipaque Contrast: 55 ml  Anticoagulation: IV Heparin 3000 Units PATIENT DISPOSITION:  The patient was transferred to the PACU holding area in a hemodynamicaly stable, chest pain free condition.  The patient tolerated the procedure well, and there were no complications. EBL: < 5 ml  The patient was stable before, during, and after the procedure. POST-OPERATIVE DIAGNOSIS:  Severe multivessel CAD involving all 4 major branches: 90% proximal LAD with 100% mid subtotal occlusion with minimal distal flow after the distal diagonal branch, 80-90% proximal ramus intermedius, 100% mid OM subtotal occlusion prior to bifurcation, diffuse mid RCA disease with sequential 95% stenoses as well as 50-60% stenosis prior to the major marginal branch  Known cardiomyopathy with  EF of 40% by Myoview stress test PLAN OF CARE:  Return to nursing unit for standard radial cath care  Restart heparin 6 hours after TR band removal  Start low-dose beta blocker for rate control and CAD benefit  Will review films with Interventional Colleagues in order to determine the best course of action. Theoretically CABG would be considered for multivessel  disease, however in the absence of the distal LAD for lead insertion, the benefit is less appreciable.  Will consult CT surgery for their opinion, but would also consider the possibility of staged multivessel PCI with possible mechanical support.  Would keep patient in-patient pending decision to be made based on the severity of his symptoms.    2D ECHO: 03/15/2014 LV EF: 40% - 45% Study Conclusions - Left ventricle: The cavity size was normal. Wall thickness was normal. Systolic function was mildly to moderately reduced. The estimated ejection fraction was in the range of 40% to 45%. Mid to distal inferoapical and apical hypokinesis. Doppler parameters are consistent with abnormal left ventricular relaxation (grade 1 diastolic dysfunction). The E/e&' ratio is >15, suggesting elevated LV filling pressure. - Aortic valve: Mildly calcified leaflets. There was no stenosis. There was no regurgitation. - Mitral valve: Calcified annulus. Mildly thickened leaflets . There was trivial regurgitation. - Left atrium: The atrium was at the upper limits of normal in size. - Right ventricle: The cavity size was normal. Wall thickness was normal. Systolic function is reduced. Lateral annulus peak S velocity: 12.4 cm/s. - Right atrium: The atrium was normal in size. - Atrial septum: No defect or patent foramen ovale was identified. - Tricuspid valve: There was mild regurgitation. - Pulmonary arteries: PA peak pressure: 21 mm Hg (S). - Inferior vena cava: The vessel was normal in size. The respirophasic diameter changes were in the normal  range (= 50%), consistent with normal central venous pressure. Impressions: - LVEF 40-45%, mid to distal inferoapical and apical hypokinesis, diastolic dysfunction with elevated LV filling pressure, mild to moderate RV hypokinesis.      Discharge Medications     Medication List    STOP taking these medications       esomeprazole 20 MG capsule  Commonly known as:  [place] by:  pantoprazole 40 MG tablet      TAKE these medications       acetaminophen 325 MG tablet  Commonly known as:  TYLENOL  Take 650 mg by mouth every 6 (six) hours as needed for mild pain.     aspirin 81 MG tablet  Take 81 mg by mouth every evening.     b complex vitamins tablet  Take 1 tablet by mouth daily.     BIOTIN PO  Take 1 tablet by mouth 3 (three) times daily with meals.     bisacodyl 5 MG EC tablet  Commonly known as:  DULCOLAX  Take 5 mg by mouth daily as needed. CONSTIPATION     bisoprolol 5 MG tablet  Commonly known as:  ZEBETA  Take 0.5 tablets (2.5 mg total) by mouth daily.     budesonide-formoterol 160-4.5 MCG/ACT inhaler  Commonly known as:  SYMBICORT  Inhale 2 puffs into the lungs 2 (two) times daily.     CITRUCEL oral powder  Generic drug:  methylcellulose  Take 1 packet by mouth daily.     clopidogrel 75 MG tablet  Commonly known as:  PLAVIX  Take 1 tablet (75 mg total) by mouth daily with breakfast.     cyclophosphamide 50 MG tablet  Commonly known as:  CYTOXAN  Take 50 mg by mouth daily. Give on an empty stomach once daily at 1600. Take  1 hour before or 2 hours after meals.     dextromethorphan 30 MG/5ML liquid  Commonly known as:  DELSYM  Take 30 mg by mouth 2 (two) times daily as needed for  cough.     dextromethorphan-guaiFENesin 30-600 MG per 12 hr tablet  Commonly known as:  MUCINEX DM  Take 1 tablet by mouth 2 (two) times daily.     DSS 100 MG Caps  Take 100 mg by mouth 2 (two) times daily.     dutasteride 0.5 MG capsule  Commonly known as:   AVODART  Take 0.5 mg by mouth every other day. In the evening.     ferrous sulfate 325 (65 FE) MG tablet  Take 1 tablet (325 mg total) by mouth daily with breakfast.     GAS-X PO  Take 2 tablets by mouth 2 (two) times daily.     levalbuterol 45 MCG/ACT inhaler  Commonly known as:  XOPENEX HFA  Inhale 1-2 puffs into the lungs every 4 (four) hours as needed for wheezing.     loratadine 10 MG tablet  Commonly known as:  CLARITIN  Take 10 mg by mouth daily.     nitroGLYCERIN 0.4 MG SL tablet  Commonly known as:  NITROSTAT  Place 1 tablet (0.4 mg total) under the tongue every 5 (five) minutes x 3 doses as needed for chest pain.     pantoprazole 40 MG tablet  Commonly known as:  PROTONIX  Take 1 tablet (40 mg total) by mouth daily.     predniSONE 10 MG tablet  Commonly known as:  DELTASONE  Take 10 mg by mouth daily with breakfast.     simvastatin 20 MG tablet  Commonly known as:  ZOCOR  Take 1 tablet (20 mg total) by mouth daily at 6 PM.     sulfamethoxazole-trimethoprim 800-160 MG per tablet  Commonly known as:  BACTRIM DS  Take 1 tablet by mouth daily. Take with Cytoxan.     tamsulosin 0.4 MG Caps capsule  Commonly known as:  FLOMAX  Take 1 capsule (0.4 mg total) by mouth every evening.     traMADol 50 MG tablet  Commonly known as:  ULTRAM  1-2 every 4 hours as needed for cough or pain        Disposition   The patient will be discharged in stable condition to home. Discharge Instructions   Amb Referral to Cardiac Rehabilitation    Complete by:  As directed           Follow-up Information   Follow up with [facility], NP On 04/01/2014. (@ 3:30pm)    Specialty:  Cardiology   Contact information:   [address] [place] [address] [phone]       Follow up with Tinnie Gens, MD On 04/08/2014. (3:45 pm for your suture removal and wound evacuation)    Specialty:  Vascular Surgery   Contact information:   [place]  [address] [phone]       Follow up with Christinia Gully, MD. (Please follow  up with Dr. [name] in the near future)    Specialty:  Pulmonary Disease   Contact information:   [address] [place] [address] [phone]         Duration of Discharge Encounter: Greater than 30 minutes including physician and PA time.  SignedVertell Limber, KATHRYN PA-C 03/26/2014, 2:21 PM   I have examined the patient and reviewed assessment and plan and discussed with patient.  Agree with above as stated.  Discharge today. Pulmonary signed off. Patient feels ready to go. D/C tele, D/c central line. Would send home on Iron 324 mg daily with colace 100  mg BID to help prevent constipation. F/u with Dr. Debara Pickett.   Olyver Hawes S.

## 2014-03-26 NOTE — Telephone Encounter (Signed)
Forwarded to Dr. Debara Pickett for review

## 2014-03-26 NOTE — Progress Notes (Signed)
Pt is being discharged home. Patient will be transported by his wife. Pt received his discharge instructions and the RN answered all questions the patient had

## 2014-03-26 NOTE — Progress Notes (Signed)
CARDIAC REHAB PHASE I   PRE:  Rate/Rhythm: 76 SR  BP:  Supine: 103/61  Sitting:   Standing:    SaO2: 97%RA  MODE:  Ambulation: 240 ft   POST:  Rate/Rhythm: 90  BP:  Supine:   Sitting: 132/66  Standing:    SaO2: 95%RA 1032-1107 Pt walked 240 ft on RA with rolling walker on RA with steady gait. No DOE noted. Tolerated well. To chair after walk. Wants to go home. Reviewed NTG use and walking instructions. Referring to CRP 2 since pt had stents. May consider pulm rehab at later date at MD's discretion.    Graylon Good, RN BSN  03/26/2014 11:03 AM

## 2014-03-27 ENCOUNTER — Ambulatory Visit: Payer: Medicare Other | Admitting: Internal Medicine

## 2014-03-31 ENCOUNTER — Telehealth: Payer: Self-pay

## 2014-03-31 IMAGING — CR DG CHEST 2V
2 series · 2 of 2 positions shown · non-contrast
Comparison: PA and lateral chest x-ray July 26, 2013.

CLINICAL DATA: History of bronchiectasis and cough and dyspnea and
chest pain. There is a history of reactive airway disease.

EXAM:
CHEST  2 VIEW

[view not recorded (1 of 2)]
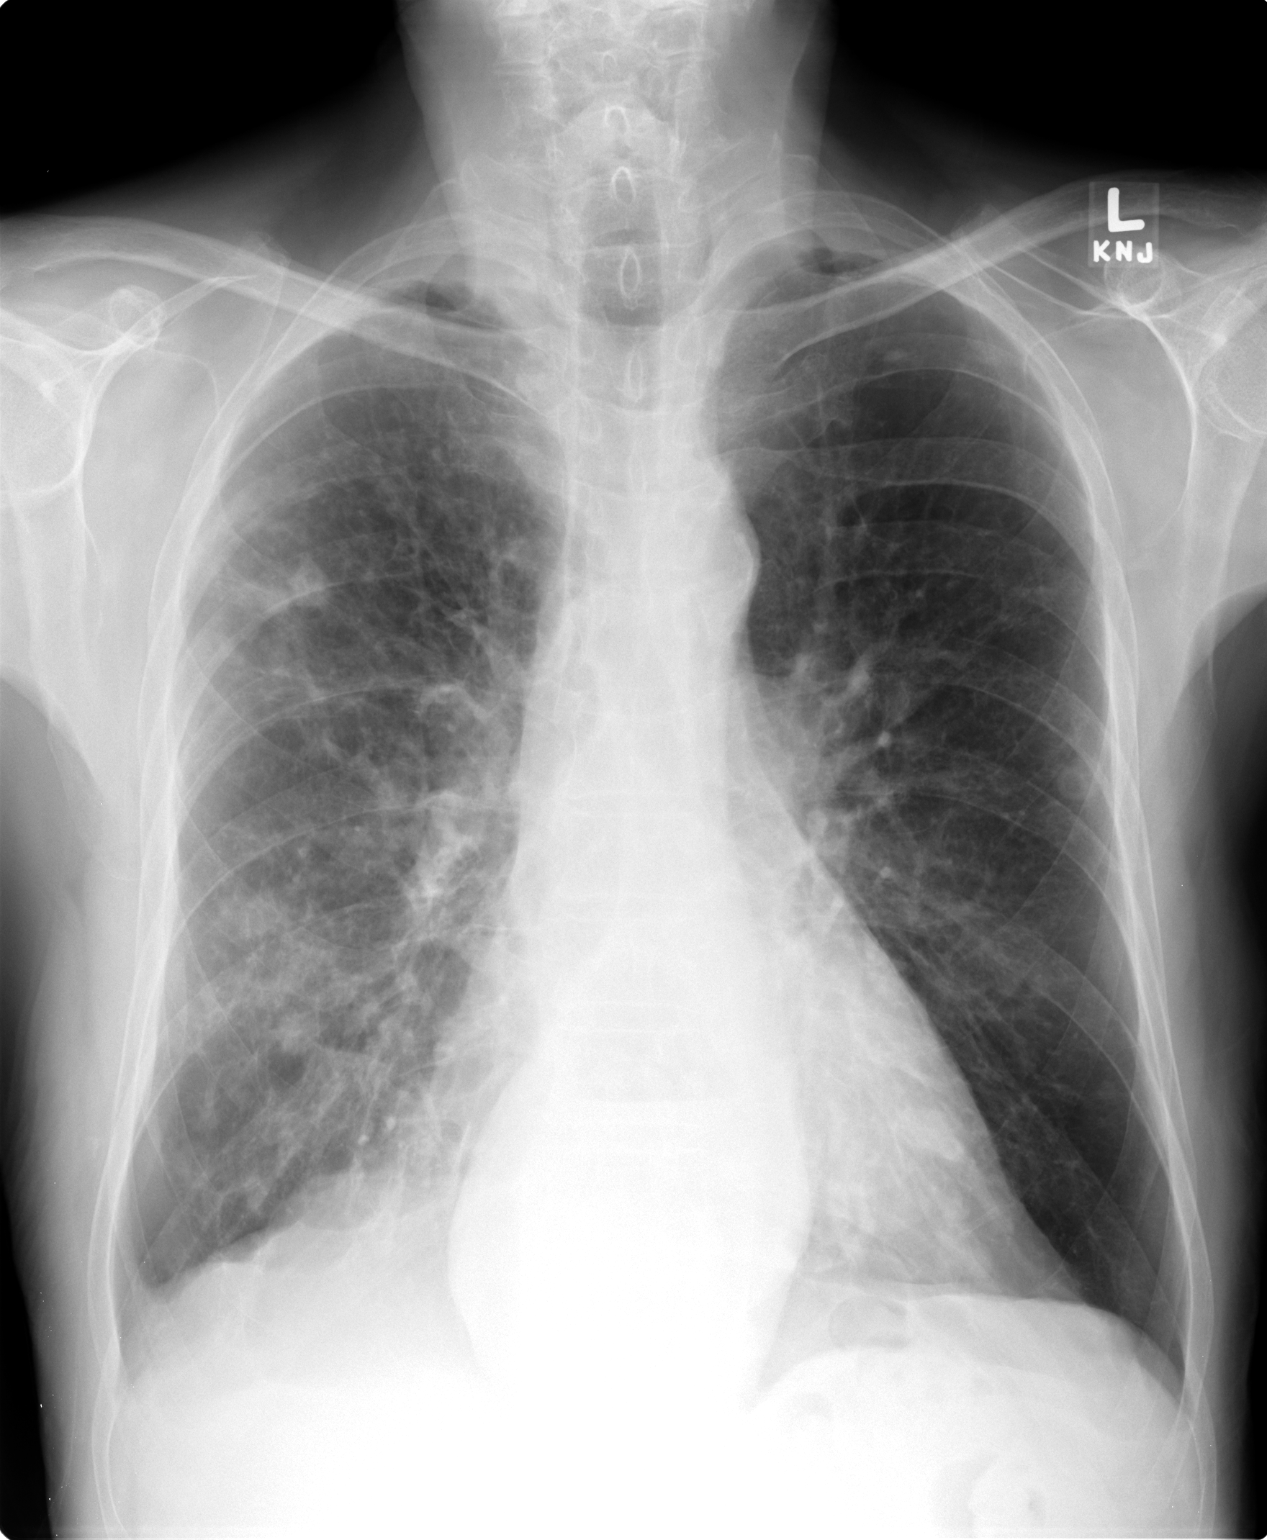

[view not recorded (2 of 2)]
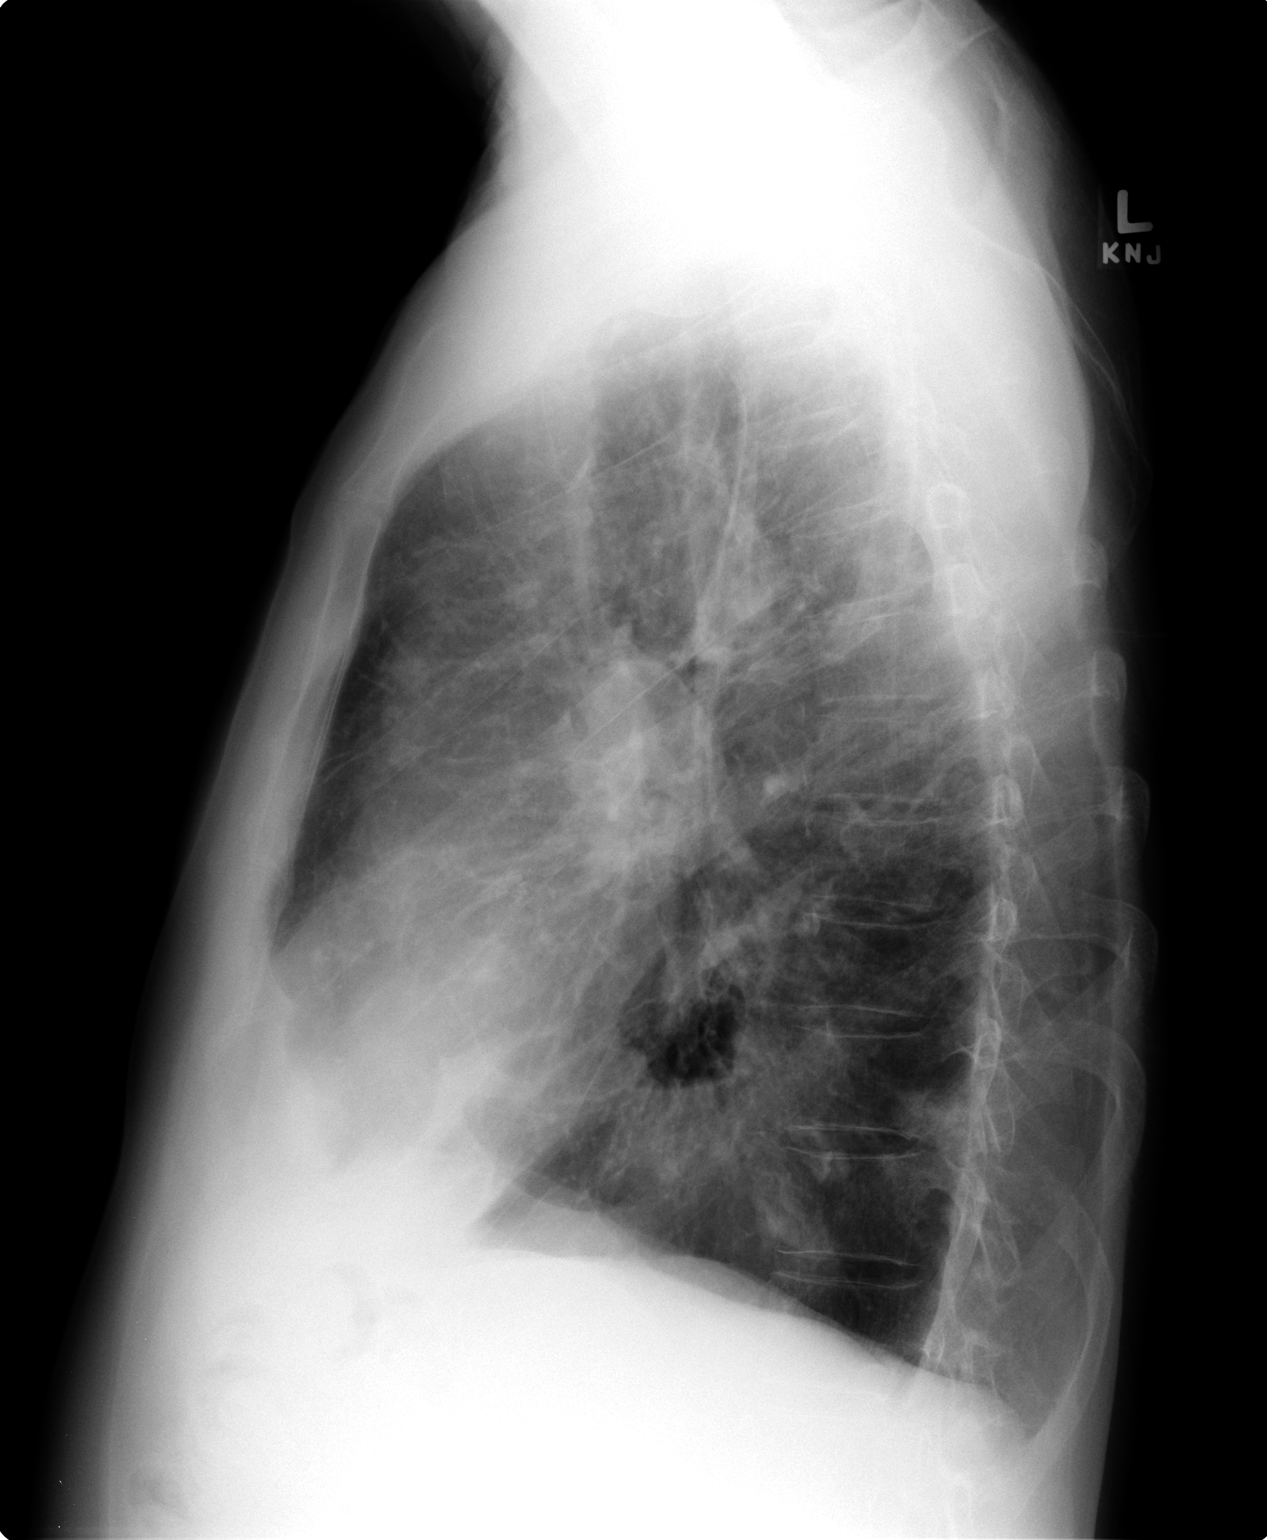

[2 of 2 positions shown; findings below may reference images not displayed]

FINDINGS: The lungs are hyperinflated. Coarse interstitial markings are
present bilaterally predominantly inferiorly consistent with
bronchiectasis and filling of the bronchial structures with fluid.
There are areas of nodular density peripherally in the right upper
lobe which are slightly more conspicuous today. There is apical
pleural thickening bilaterally which is stable. On the left in a
subpleural location just inferior to the posterior lateral aspect of
the 7th rib there is a stable nodule measuring approximately 9 mm in
diameter. The cardiopericardial silhouette is top-normal in size.
The pulmonary vascularity is not engorged. There is an air-fluid
level within a moderate-sized hiatal hernia -partially intrathoracic
stomach. The observed portions of the bony thorax appear normal.
IMPRESSION: 1. There are stable findings consistent with COPD or reactive airway
disease with bibasilar bronchiectasis.
2. There are parenchymal densities that are more conspicuous today
in the inferior aspect of the right upper lobe. There is also
subpleural nodularity in the lateral aspect of the left mid lung
which is relatively stable.
3. There is no evidence of CHF nor of a pleural or pericardial
effusion.
4. There is a large hiatal hernia -partially intrathoracic stomach.
5. Follow-up chest CT scanning now is recommended to reassess the
pulmonary parenchyma.

## 2014-03-31 NOTE — Telephone Encounter (Signed)
Pt. called to report area in right groin that is a "firm knot, approx. a little larger than half dollar."  Stated he was discharged on 8/19, and noticed the knot on 8/20 when he showered.  Denies redness/inflammation, tenderness, or drainage.  Stated he has appt. With Dr. Kellie Simmering next week, and ques. If he should be checked sooner.  Denies any increase in size of the right groin "knot".  Even stated it could be slightly smaller.  Advised pt. to continue to monitor, and to report any worsening of symptoms.  Verb. understanding.   Will make Dr. Kellie Simmering aware.

## 2014-04-01 ENCOUNTER — Encounter: Payer: Self-pay | Admitting: Cardiology

## 2014-04-01 ENCOUNTER — Ambulatory Visit (INDEPENDENT_AMBULATORY_CARE_PROVIDER_SITE_OTHER): Payer: Medicare Other | Admitting: Cardiology

## 2014-04-01 VITALS — BP 98/62 | HR 82 | Ht 67.0 in | Wt 118.1 lb

## 2014-04-01 DIAGNOSIS — R0602 Shortness of breath: Secondary | ICD-10-CM

## 2014-04-01 DIAGNOSIS — I724 Aneurysm of artery of lower extremity: Secondary | ICD-10-CM

## 2014-04-01 DIAGNOSIS — I2589 Other forms of chronic ischemic heart disease: Secondary | ICD-10-CM

## 2014-04-01 DIAGNOSIS — Z79899 Other long term (current) drug therapy: Secondary | ICD-10-CM

## 2014-04-01 DIAGNOSIS — N183 Chronic kidney disease, stage 3 unspecified: Secondary | ICD-10-CM

## 2014-04-01 DIAGNOSIS — D62 Acute posthemorrhagic anemia: Secondary | ICD-10-CM

## 2014-04-01 DIAGNOSIS — R5381 Other malaise: Secondary | ICD-10-CM

## 2014-04-01 DIAGNOSIS — I2510 Atherosclerotic heart disease of native coronary artery without angina pectoris: Secondary | ICD-10-CM

## 2014-04-01 DIAGNOSIS — I255 Ischemic cardiomyopathy: Secondary | ICD-10-CM

## 2014-04-01 DIAGNOSIS — R5383 Other fatigue: Principal | ICD-10-CM

## 2014-04-01 LAB — CBC
HCT: 27.8 % — ABNORMAL LOW (ref 39.0–52.0)
HEMOGLOBIN: 9.2 g/dL — AB (ref 13.0–17.0)
MCH: 30.9 pg (ref 26.0–34.0)
MCHC: 33.1 g/dL (ref 30.0–36.0)
MCV: 93.3 fL (ref 78.0–100.0)
Platelets: 533 10*3/uL — ABNORMAL HIGH (ref 150–400)
RBC: 2.98 MIL/uL — ABNORMAL LOW (ref 4.22–5.81)
RDW: 16.9 % — ABNORMAL HIGH (ref 11.5–15.5)
WBC: 11.4 10*3/uL — ABNORMAL HIGH (ref 4.0–10.5)

## 2014-04-01 NOTE — Assessment & Plan Note (Signed)
euvolemic 

## 2014-04-01 NOTE — Patient Instructions (Addendum)
Your physician recommends that you schedule a follow-up appointment in:  2 weeks with Dr. Debara Pickett or an extender when Dr. Debara Pickett is seeing patients  You are ok to take levaquin as directed  Have your labs done today as you leave this visit

## 2014-04-01 NOTE — Assessment & Plan Note (Signed)
With repair, now with hematoma.  Not sure how much residual hematoma was present at discharge, will check CBC, no acute pain.

## 2014-04-01 NOTE — Assessment & Plan Note (Signed)
Pale today, BP borderline and pt weak.  + SOB, presumed new hematoma rt. Groin.  Check CBC.  We discussed if H/H low or the same then I would recommend transfusion as outpt.

## 2014-04-01 NOTE — Assessment & Plan Note (Signed)
On 03/17/14 he had successful PCI to the LAD and POBA to a chronically occluded LCX.   PCI on 03/21/14 with rotablator atherectomy with BMS to the mid RCA. Due to length of fluoro time and large volume of contrast, it was decided to medically manage the ramus stenosis which Dr. Angelena Form felt was moderately stenosed and if he has recurrent anginal then consider PCI. Pain free today, stable EKG. On plavix and ASA

## 2014-04-01 NOTE — Assessment & Plan Note (Signed)
Pt with multiple co morbidities that are working together alone to cause.  His lung issues are significant will begin his Levaquin from home take for 7 days.  He does not seem volume overloaded.  No chest pain.  + anemia and possible new hematoma in rt groin, will check CBC.

## 2014-04-01 NOTE — Telephone Encounter (Signed)
Reported pt's symptoms to Dr. Kellie Simmering.  Agreed that pt. should continue to monitor, keep appt. next Tuesday, and call office sooner if symptoms worsen.  Pt. notified this AM of Dr. Evelena Leyden advise.  Verb. understanding.

## 2014-04-01 NOTE — Progress Notes (Signed)
04/01/2014   PCP: [name], MD   Chief Complaint  Patient presents with  . Follow-up    Post Cath stents placed, pt c/o SOB    Primary Cardiologist:Dr. Alma Friendly   HPI:  78 y.o. male here for follow up after complex hospitalization, with a history of hypothyroidism, HLD, anemia, Wegener's granulomatosis and no prior cardiac disease who presented to Hospital with chest pain 03/13/14, + nuc study and cardiac cath 03/14/14 which revealed severe multivessel CAD involving all 4 major branches. Seen by Dr. Madolyn Frieze who deemed him unsuitable for CABG due to underlying comorbid conditions. ( sts score >30). It was elected to proceed with staged multivessel PCI .  On 03/17/14 he had successful PCI to the LAD and POBA to a chronically occluded LCX. There remained high grade proximal disease to a large proximal Ramus branch as well as mid-RCA disease so staged PCI was planned at a later date due to pulmonary issues and renal dysfunction. This was further complicated by right femoral artery pseudoaneurysm requiring repair. PCI on 03/21/14 with rotablator atherectomy with BMS to the mid RCA. Due to length of fluoro time and large volume of contrast, it was decided to medically manage the ramus stenosis which Dr. Angelena Form felt was moderately stenosed and if he has recurrent anginal then consider PCI.  TODAY denies chest pain.    At discharge he H/H 7.7/22.9 PLTS 309. He rec'd 3 units PRBC in the hospital.  Last vascular note on rt groin without hematoma after JP drain removed. Today he complained of rt groin "Knott"  This he noticed 3-4 days ago, no pain.    SOB with chronic issues.  Pul. Followed in hospital.  Today SOB continues and is productive.  He would like to take his Levaquin.  No fever.  Possible SOB is related to anemia.  Chronic systolic HF stable.          Allergies  Allergen Reactions  . Factive [Gemifloxacin]     Ineffective     Current Outpatient Prescriptions    Medication Sig Dispense Refill  . acetaminophen (TYLENOL) 325 MG tablet Take 650 mg by mouth every 6 (six) hours as needed for mild pain.      Marland Kitchen aspirin 81 MG tablet Take 81 mg by mouth every evening.       Marland Kitchen b complex vitamins tablet Take 1 tablet by mouth daily.        Marland Kitchen BIOTIN PO Take 1 tablet by mouth 3 (three) times daily with meals.      . bisacodyl (DULCOLAX) 5 MG EC tablet Take 5 mg by mouth daily as needed. CONSTIPATION      . bisoprolol (ZEBETA) 5 MG tablet Take 0.5 tablets (2.5 mg total) by mouth daily.  30 tablet  3  . budesonide-formoterol (SYMBICORT) 160-4.5 MCG/ACT inhaler Inhale 2 puffs into the lungs 2 (two) times daily.      . clopidogrel (PLAVIX) 75 MG tablet Take 1 tablet (75 mg total) by mouth daily with breakfast.  30 tablet  1  . cyclophosphamide (CYTOXAN) 50 MG tablet Take 50 mg by mouth daily. Give on an empty stomach once daily at 1600. Take  1 hour before or 2 hours after meals.      Marland Kitchen dextromethorphan (DELSYM) 30 MG/5ML liquid Take 30 mg by mouth 2 (two) times daily as needed for cough.      . dextromethorphan-guaiFENesin (MUCINEX DM) 30-600 MG per 12 hr  tablet Take 1 tablet by mouth 2 (two) times daily.       Marland Kitchen docusate sodium 100 MG CAPS Take 100 mg by mouth 2 (two) times daily.  10 capsule  11  . dutasteride (AVODART) 0.5 MG capsule Take 0.5 mg by mouth every other day. In the evening.      . Esomeprazole Magnesium (NEXIUM PO) Take 22.5 mg by mouth 2 (two) times daily.      . ferrous sulfate 325 (65 FE) MG tablet Take 1 tablet (325 mg total) by mouth daily with breakfast.  30 tablet  11  . levalbuterol (XOPENEX HFA) 45 MCG/ACT inhaler Inhale 1-2 puffs into the lungs every 4 (four) hours as needed for wheezing.  1 Inhaler  12  . loratadine (CLARITIN) 10 MG tablet Take 10 mg by mouth daily.       . methylcellulose (CITRUCEL) oral powder Take 1 packet by mouth daily.      . nitroGLYCERIN (NITROSTAT) 0.4 MG SL tablet Place 1 tablet (0.4 mg total) under the tongue every  5 (five) minutes x 3 doses as needed for chest pain.  25 tablet  12  . predniSONE (DELTASONE) 10 MG tablet Take 10 mg by mouth daily with breakfast.      . Simethicone (GAS-X PO) Take 2 tablets by mouth 2 (two) times daily.       . simvastatin (ZOCOR) 20 MG tablet Take 1 tablet (20 mg total) by mouth daily at 6 PM.  30 tablet  11  . sulfamethoxazole-trimethoprim (BACTRIM DS) 800-160 MG per tablet Take 1 tablet by mouth daily. Take with Cytoxan.      . tamsulosin (FLOMAX) 0.4 MG CAPS capsule Take 1 capsule (0.4 mg total) by mouth every evening.  30 capsule  11  . traMADol (ULTRAM) 50 MG tablet 1-2 every 4 hours as needed for cough or pain  40 tablet  0   No current facility-administered medications for this visit.    Past Medical History  Diagnosis Date  . GERD (gastroesophageal reflux disease)   . Asthma   . Fungal infection     lungs  . Cataract   . BPH (benign prostatic hyperplasia)     sees Dr. Risa Grill, biopsy June 2015 was benign   . Wegener's granulomatosis     sees Dr. [name]   . Peptic stricture of esophagus   . Anemia   . Hiatal hernia   . Adenomatous colon polyp   . Bronchiectasis   . On home oxygen therapy     uses 2 l/m nasally at bedtime  . HOH (hard of hearing)     bilaterally  . HTN (hypertension) 09/28/2013  . CAD (coronary artery disease)     a. [place] s/p [place] x3 and ultimately BMS to pLAD& POBA to CTO of circumflex/marginal vessel on 03/17/14 and staged BMS to [facility] on 03/21/14  . Femoral artery pseudo-aneurysm, right     a. s/p repair. Follow up with Dr. Kellie Simmering     Past Surgical History  Procedure Laterality Date  . Hernia repair    . Eye surgery      cataracts removed.   . Colonoscopy  08-16-11    per Dr. Fuller Plan, diverticulosis and polyps, repeat in 5 yrs   . Esophagogastroduodenoscopy (egd) with esophageal dilation  11-29-10    per Dr. Fuller Plan   . Cataract extraction, bilateral  12-18-12    bilateral  . Cholecystectomy N/A 12/21/2012    Procedure: LAPAROSCOPIC  CHOLECYSTECTOMY WITH INTRAOPERATIVE  CHOLANGIOGRAM;  Surgeon: Edward Jolly, MD;  Location: [facility];  Service: General;  Laterality: N/A;  . Hematoma evacuation Right 03/19/2014    Procedure: Suture repair of femoral artery with evacuation of hematoma;  Surgeon: Mal Misty, MD;  Location: [facility];  Service: Vascular;  Laterality: Right;    LTJ:[id number]:+ productive cough no fevers, weight up 3 pounds Skin:no rashes or ulcers, + ecchymosis of upper ext. And neck from hospital sticks  HEENT:no blurred vision, no congestion CV:see HPI PUL:see HPI GI:no diarrhea constipation or melena, no indigestion GU:no hematuria, no dysuria MS:no joint pain, no claudication Neuro:no syncope, no lightheadedness Endo:no diabetes, + thyroid disease  Wt Readings from Last 3 Encounters:  04/01/14 118 lb 1.6 oz (53.57 kg)  03/26/14 115 lb 4.8 oz (52.3 kg)  03/26/14 115 lb 4.8 oz (52.3 kg)    PHYSICAL EXAM BP 98/62  Pulse 82  Ht 5\' 7"  (1.702 m)  Wt 118 lb 1.6 oz (53.57 kg)  BMI 18.49 kg/m2 General:Pleasant affect, NAD Skin:Warm and dry, brisk capillary refill, pale in appearance HEENT:normocephalic, sclera clear, mucus membranes moist, bruising on neck from sticks-IV Neck:supple, no JVD, no bruits  Heart:S1S2 RRR without murmur, gallup, rub or click Lungs:diminished,  without rales,+ rhonchi, + wheezes RAQ:TMAU, non tender, + BS, do not palpate liver spleen or masses Ext:no lower ext edema, 2+ pedal pulses, 2+ radial pulses, rt groin with healing of surgical incision, 3-4 cm hematoma at base on incision.  Per vascular MDs last note no hematoma. Rt leg with ecchymosis down rt leg.  Neuro:alert and oriented, MAE, follows commands, + facial symmetry EKG:SR rate of 82, 1st degree AV block but no changes otherwise.  ASSESSMENT AND PLAN Femoral artery pseudo-aneurysm, right With repair, now with hematoma.  Not sure how much residual hematoma was present at discharge, will check CBC, no acute  pain.  SOB (shortness of breath) Pt with multiple co morbidities that are working together alone to cause.  His lung issues are significant will begin his Levaquin from home take for 7 days.  He does not seem volume overloaded.  No chest pain.  + anemia and possible new hematoma in rt groin, will check CBC.      CAD- severe 4 V CAD- not CABG candidate On 03/17/14 he had successful PCI to the LAD and POBA to a chronically occluded LCX.   PCI on 03/21/14 with rotablator atherectomy with BMS to the mid RCA. Due to length of fluoro time and large volume of contrast, it was decided to medically manage the ramus stenosis which Dr. Angelena Form felt was moderately stenosed and if he has recurrent anginal then consider PCI. Pain free today, stable EKG. On plavix and ASA  Cardiomyopathy, ischemic- EF 40% by Myoview euvolemic  Chronic renal insufficiency, stage III (moderate) Monitor and check BMP today  Acute blood loss anemia Pale today, BP borderline and pt weak.  + SOB, presumed new hematoma rt. Groin.  Check CBC.  We discussed if H/H low or the same then I would recommend transfusion as outpt.     Keep appt with Dr. Kellie Simmering on Tuesday, see Dr. Alma Friendly in 2 weeks.

## 2014-04-01 NOTE — Assessment & Plan Note (Signed)
Monitor and check BMP today

## 2014-04-02 ENCOUNTER — Telehealth: Payer: Self-pay | Admitting: *Deleted

## 2014-04-02 ENCOUNTER — Telehealth: Payer: Self-pay | Admitting: Internal Medicine

## 2014-04-02 DIAGNOSIS — E875 Hyperkalemia: Secondary | ICD-10-CM

## 2014-04-02 DIAGNOSIS — Z79899 Other long term (current) drug therapy: Secondary | ICD-10-CM

## 2014-04-02 LAB — BASIC METABOLIC PANEL
BUN: 30 mg/dL — ABNORMAL HIGH (ref 6–23)
CALCIUM: 8.6 mg/dL (ref 8.4–10.5)
CO2: 24 meq/L (ref 19–32)
CREATININE: 1.67 mg/dL — AB (ref 0.50–1.35)
Chloride: 101 mEq/L (ref 96–112)
GLUCOSE: 120 mg/dL — AB (ref 70–99)
Potassium: 5.6 mEq/L — ABNORMAL HIGH (ref 3.5–5.3)
SODIUM: 135 meq/L (ref 135–145)

## 2014-04-02 MED ORDER — SODIUM POLYSTYRENE SULFONATE 15 GM/60ML PO SUSP: 30.0000 g | Freq: Once | ORAL | Status: DC

## 2014-04-02 NOTE — Telephone Encounter (Signed)
Spoke to [facility] She needs clarification for Kayexalate dosage. RN contact Cecilie Kicks NP- per verbal order - patient to take 30grams today .  RN spoke to [place]- information given.  Drai states the pharmacy does not have medication today will be able to order for tomorrow. RN informed her patient needs medication today ,please call other pharmacy to locate for patient. Oswaldo Milian states she will call.

## 2014-04-02 NOTE — Telephone Encounter (Signed)
Spoke with patient. Provided BMET and CBC results per Mickel Baas. Instructed patient on kayexalate to decrease potassium and to have repeat BMET tomorrow. Med and labs ordered. Patient voiced understanding. Lab results/las OV note reviewed with DOD and no other orders received. Also spoke with Mickel Baas and informed her that patient was aware of results/meds/repeat labwork tomorrow.

## 2014-04-02 NOTE — Telephone Encounter (Signed)
Message copied by Fidel Levy on Wed Apr 02, 2014  8:05 AM ------      Message from: Isaiah Serge      Created: Wed Apr 02, 2014  6:26 AM       Please order kayexalate 20gm for pt to take today- review with DOD- I see no meds that would cause this.  Also his HgB is better. ------

## 2014-04-03 LAB — BASIC METABOLIC PANEL
BUN: 25 mg/dL — ABNORMAL HIGH (ref 6–23)
CALCIUM: 8 mg/dL — AB (ref 8.4–10.5)
CO2: 25 mEq/L (ref 19–32)
CREATININE: 1.68 mg/dL — AB (ref 0.50–1.35)
Chloride: 101 mEq/L (ref 96–112)
Glucose, Bld: 189 mg/dL — ABNORMAL HIGH (ref 70–99)
Potassium: 4.5 mEq/L (ref 3.5–5.3)
Sodium: 138 mEq/L (ref 135–145)

## 2014-04-04 ENCOUNTER — Telehealth: Payer: Self-pay | Admitting: *Deleted

## 2014-04-04 DIAGNOSIS — Z79899 Other long term (current) drug therapy: Secondary | ICD-10-CM

## 2014-04-04 NOTE — Telephone Encounter (Signed)
Patient notified of lab results and instructed to have repeat BMET Wednesday Sept 2nd. Patient voiced understanding.

## 2014-04-07 ENCOUNTER — Encounter: Payer: Self-pay | Admitting: Vascular Surgery

## 2014-04-08 ENCOUNTER — Ambulatory Visit (INDEPENDENT_AMBULATORY_CARE_PROVIDER_SITE_OTHER): Payer: Self-pay | Admitting: Vascular Surgery

## 2014-04-08 ENCOUNTER — Encounter: Payer: Self-pay | Admitting: Vascular Surgery

## 2014-04-08 VITALS — BP 120/60 | HR 81 | Ht 67.0 in | Wt 116.8 lb

## 2014-04-08 DIAGNOSIS — I724 Aneurysm of artery of lower extremity: Secondary | ICD-10-CM

## 2014-04-08 NOTE — Progress Notes (Signed)
Subjective:     Patient ID: Richard [name], male   DOB: 01-21-1936, 78 y.o.   MRN: [id number]  HPI this 78 year old male returns for followup regarding an urgent repair of a right femoral pseudoaneurysm which occurred following cardiac catheterization with intervention. The surgical repair was performed on 03/19/2014. Patient subsequently underwent intervention to the right radial artery. He has had no disc difficulty ambulating. He said no drainage or infection in the right inguinal area. He is taking Plavix.  Review of Systems     Objective:   Physical Exam BP 120/60  Pulse 81  Ht 5\' 7"  (1.702 m)  Wt 116 lb 12.8 oz (52.98 kg)  BMI 18.29 kg/m2  SpO2 100%  Gen. thin male patient in no apparent distress alert and oriented x3 Right inguinal wound examined and healed nicely. Slight swelling inferiorly from previous diffuse hematoma. No pulsatile mass noted. 3+ femoral popliteal to posterior cells pedis pulse palpable. No distal edema noted.      Assessment:     Doing well post repair right femoral pseudoaneurysm following cardiac catheterization and intervention    Plan:     Return to see me on when necessary basis

## 2014-04-10 ENCOUNTER — Telehealth: Payer: Self-pay | Admitting: *Deleted

## 2014-04-10 DIAGNOSIS — Z79899 Other long term (current) drug therapy: Secondary | ICD-10-CM

## 2014-04-10 DIAGNOSIS — E875 Hyperkalemia: Secondary | ICD-10-CM

## 2014-04-10 LAB — BASIC METABOLIC PANEL
BUN: 27 mg/dL — ABNORMAL HIGH (ref 6–23)
CHLORIDE: 105 meq/L (ref 96–112)
CO2: 22 meq/L (ref 19–32)
Calcium: 8.4 mg/dL (ref 8.4–10.5)
Creat: 1.7 mg/dL — ABNORMAL HIGH (ref 0.50–1.35)
Glucose, Bld: 195 mg/dL — ABNORMAL HIGH (ref 70–99)
POTASSIUM: 5.2 meq/L (ref 3.5–5.3)
SODIUM: 136 meq/L (ref 135–145)

## 2014-04-10 NOTE — Telephone Encounter (Signed)
Spoke to patient and wife. Instruction given stop eating high -enrich k+ foods Labs drawn on Tuesday 04/15/14 (bmp-gfr) Verbalized understanding.

## 2014-04-10 NOTE — Telephone Encounter (Signed)
Message copied by Raiford Simmonds on Thu Apr 10, 2014  2:13 PM ------      Message from: Richard [name]      Created: Thu Apr 10, 2014 11:17 AM       Pt's K= level, climbing, please have pt stop all foods with potassium, no salt substitute.  Stop bananas, oranges, anything that could be raising K+.  Recheck on Monday with GFR.  I see no meds to cause, but check with DOD as well. ------

## 2014-04-10 NOTE — Telephone Encounter (Signed)
Reviewed with Dr Debara Pickett, per Dr Debara Pickett  Follow  Mickel Baas instructions  Left message on home and cell phone to call back  need to discuss lab results.

## 2014-04-11 ENCOUNTER — Encounter: Payer: Self-pay | Admitting: Internal Medicine

## 2014-04-11 ENCOUNTER — Ambulatory Visit (INDEPENDENT_AMBULATORY_CARE_PROVIDER_SITE_OTHER): Payer: Medicare Other | Admitting: Internal Medicine

## 2014-04-11 VITALS — BP 158/58 | HR 89 | Temp 98.4°F | Ht 67.0 in | Wt 117.2 lb

## 2014-04-11 DIAGNOSIS — M3130 Wegener's granulomatosis without renal involvement: Secondary | ICD-10-CM

## 2014-04-11 DIAGNOSIS — Z23 Encounter for immunization: Secondary | ICD-10-CM

## 2014-04-11 DIAGNOSIS — J479 Bronchiectasis, uncomplicated: Secondary | ICD-10-CM

## 2014-04-11 MED ORDER — CIPROFLOXACIN HCL 500 MG PO TABS: 500.0000 mg | ORAL_TABLET | Freq: Two times a day (BID) | ORAL | Status: DC

## 2014-04-11 NOTE — Progress Notes (Signed)
Subjective:    Patient ID: Richard [name], male    DOB: 03-08-1936     MRN: [id number]   Brief patient profile:  21  yowm never smoker with dx of Longstanding bronchiectasis then dx with WG 03/2010 > requested establish with Jerald Hennington.   Admit [facility] dx WG by pos c anca 03/2010, supportive tbbx but not vats   Admit [name] dx spont L Ptx 9/08/15/09 requiring Chest tube but no surgery   05/24/10--NP ov/ med reivew. Since last visit. has been hospitalized 05/12/2010-- 10/10/2011for Right pneumothorax., with underlying Wegener granulomatosis. and . Bronchiectasis. Pt was found to have Right Pneumo. Chest tube was placed. with improvement and removed 10/7. Xray on 10/10 w/ resolved pnuemo. He was continued on cytoxan and bactrim for PCP prophylaxis. rec. Prednisone 20 mg reduce to one half daily  Aspirin 81 mg one daily with breakfast but stop for any bleeding  See Patient Care Coordinator before leaving for cardiology appt > saw [name], ? LHC needed       11/23/2012 f/u ov/Glendine Swetz re WG/ bronchiectasis Chief Complaint  Patient presents with  . Follow-up    Had PFT today-sob same,cough-yellow,wheezing occass.,  no hemoptysis, no real limiting sob though sedentary, no arthralgias or rash maintaining 2.5 mg daily No change prednisone 5 mg one half daily  > ok to try every other day if doing great May 1 flare rx levaquin and pred May 16 lap chole hoxworth     08/19/2013 f/u ov/Kollen Armenti re: ? Recurrent WG Chief Complaint  Patient presents with  . Follow-up    Breathing is unchanged. Cough unchanged-prod cpugh w/ yellow phlem. On 2nd dose of cipro  now on day 5/10 cipro and less dark, never bloody, no sinus complaints, arthritis rash or fever or hematuria. >ANCA 1:80 titer, started on cytoxan        09/27/2013 f/u ov/Zac Torti re:  WG on pred 10 and ctx 50 mg daily  Chief Complaint  Patient presents with  . Follow-up    Pt states cough and SOB seems worse since the last visit. Still producing yellow  sputum. He states has low grade temp every am.   assoc with nasal congestion but no epistaxis or hemoptysis  rec Stop Zebeta Schedule sinus CT> 2/09/14/13 Mild to moderate mucosal thickening in the paranasal sinuses without  air-fluid level. For cough use the mucinex dm up to 1200 mg every 12 hours and as much flutter valve as possible For breathing use xopenex up to 2 puffs every 4 hours if needed and don't leave home wihout it   Admit date: 10/04/2013  Discharge date: 10/07/2013  Discharge Diagnoses:  SOB (shortness of breath)  Wegener's granulomatosis  Bronchiectasis with acute exacerbation  SVT (supraventricular tachycardia)  Cough  Weakness generalized  Discharge Condition: Stable and improved  Filed Weights    10/04/13 1539   Weight:  58.423 kg (128 lb 12.8 oz)   History of present illness:  Patient is a pleasant 78 year old white man with a past medical history significant for Wegener's granulomatosis diagnosed in 2011 was on Cytoxan until 2013. He follows with Dr. [name]. He is maintained on prednisone 10 mg daily. He had been feeling well until mid January at which point a chest x-ray showed increased nodules and Dr. [name] decided to restart Cytoxan. Patient came into the hospital with the above-mentioned complaints. His heart rate was noted to be in the 180s in the rhythm of SVT. He converted to sinus rhythm spontaneously. CT scan of  the chest shows bronchiectasis as well as numerous cavitary and non-cavitary nodules. We have been asked to admit him for further evaluation and management. In addition to his cough and mild shortness of breath, he endorses low-grade temperatures for about 2 weeks as well as increasing sinus symptoms.  Hospital Course:  Wegener's Granulomatosis/Bronchiectasis with Acute Exacerbation  -Cx data remains negative to date.  -As discussed with Dr. [name], will transition antibiotics to cipro for 3 more days, continue cytoxan (bactrim while on cytoxan for PCP  prophylaxis), and slow steroid taper until seen again by Dr. [name].  SVT  -Spontaneously converted to NSR.          12/06/13 to ER with dehyrdration   01/09/2014 f/u ov/Arlo Buffone re:  Chief Complaint  Patient presents with  . Follow-up    Pt c/o non prod cough and states that his breathing is no better. He c/o poor appetite.   rec If mucus gets nasty,  Levaquin 750 x 7 days If mucus gets thick >  mucinex dm 1200 mg every 12 h and use the flutter valve   02/12/2014 f/u ov/Madason Rauls re: bronchiectasis / pred 10 / cyt 2 Chief Complaint  Patient presents with  . Follow-up    Pt states that his symptoms are unchanged since last visit. Finished round of levaquin x 2 days ago.  No new co's today.   ok at [employer] leaning on the cart  New occ chest discomfort when sob x sev months, goes away at rest. No assoc diaph, nausea, radiation Has never tried xopenex for this symptom to date, encouraged to do so rec Please see patient coordinator before you leave today  to schedule VEST Tdap today      Patient ID: Richard [name]  MRN: [id number], DOB/AGE: 05-Nov-1935 78 y.o.  Admit date: 03/13/2014  D/C date: 03/26/2014   Primary Cardiologist: Dr. Debara Pickett  Principal Problem:  Unstable Class III Angina  Active Problems:  HYPOTHYROIDISM  HYPERLIPIDEMIA  ANEMIA  Wegener's granulomatosis  Chronic respiratory failure  Abnormal nuclear stress test: Intermediate risk with moderate region of apical and inferior scar with mild superimposed ischemia; EF 40%  CAD- severe 4 V CAD- not CABG candidate  Anemia- transfused 03/16/14  Chronic renal insufficiency, stage III (moderate)  Cardiomyopathy, ischemic- EF 40% by Myoview  Acute blood loss anemia  Femoral artery pseudo-aneurysm, right  Admission Dates: 03/13/14 - 03/26/14  Discharge Diagnosis: [place] s/p [facility] x3 and ultimately BMS to pLAD& POBA to CTO of circumflex/marginal vessel on 03/17/14 and staged BMS to mRCA on 03/21/14  HPI: Richard [name] is a 78 y.o. male  with a history of hypothyroidism, HLD, anemia, Wegener's granulomatosis and no prior cardiac disease who presented to [facility] ED on 03/13/14 with chest pain and was transferred to [facility] for cath the following morning.  PMH significant for Wegener's granulomatosis and follows with Dr. [name] for his pulmonary disease (severe multifocal bronchiectasis as well as numerous cavitary and non-cavitary nodules). He had been having chest tightness with significant exertion for the last few months with increasing dyspnea on exertion. Also had 2 brothers who both had CABG. Referred to Dr. Debara Pickett who proceeded with nuclear stress testing which demonstrated a "moderate region of apical and inferior scar with mild superimposed qualitative ischemia". LV wall motion demonstrated a "moderate degree of hypokinesis involving the mid-distal inferior wall and apex with an EF of 40%". Prior echo dated 03/30/2010 showed EF 45-50%.  Hospital Course: He presented to [facility] ED with increasing fatigue  and chest tightness which developed that morning, which resolved within 10 minutes.  He was given ASA, heparin, and nitro paste. ECG demonstrated sinus tachycardia with no acute ST-T abnormalities. POC troponin normal. Also noted to be anemic, Hgb 9 (10 on 12/06/13). Dr. [name] noted that Mr. Mayhall has had hemoptysis in the past, therefore limiting DAPT was ideal and BMS over DES thought to be the best option.  [place]/CAD:  -- Abnormal outpatient nuclear stress test on 03/12/14: Intermediate risk with moderate region of apical and inferior scar with mild superimposed ischemia; EF 40%  -- s/p first LHC on 03/14/14 which revealed severe multivessel CAD involving all 4 major branches. Seen by Dr. Madolyn Frieze on this admission who deemed him unsuitable for CABG due to underlying comorbid conditions. ( sts score >30). It was elected to proceed with staged multivessel PCI .  -- s/p LHC on 03/17/14 with successful PCI to the LAD and POBA to a chronically occluded LCX. There  remained high grade proximal disease to a large proximal Ramus branch as well as mid-RCA disease so staged PCI was planned at a later date due to pulmonary issues and renal dysfunction. This was further complicated by right femoral artery pseudoaneurysm requiring repair. LHC on 03/21/14 s/p PCI with rotablator atherectomy with BMS to the mid RCA. Due to length of fluoro time and large volume of contrast, it was decided to medically manage the ramus stenosis which Dr. Angelena Form felt was moderately stenosed and if he has recurrent anginal then consider PCI.  -- Continue ASA/BB/Plavix/statin  Right femoral pseudoaneurysm: s/p suture repair of femoral artery with evacuation of hematoma on 03/19/14. Still with significant hematoma down buttox and hamstring but improving. CBC stable at 7.7.  -- Has follow up with Dr. Kellie Simmering on 04/08/2014  Anemia: subsequent to right femoral pseudoaneurysm- s/p surgical repair. S/p blood transfusion x 2. Hgb improved from 7.4 to 8.6. Stable at 7.7 on discharge.  --Will send home on Iron 324 mg daily with colace 100 mg BID to help prevent constipation  SOB - BNP elevated at 4937. D-Dimer elevated at 2.6. CTA with no acute PE. No s/s of volume overload  -- Though to be possibly due to anemia. Chest xray showed multiple cavitary lesions but no CHF. CTA with multiple abnormalities possibly reflecting acute bronchitis vs organizing PNA. PCCM (followed by Dr. [name]) consulted who felt that the CT was at his baseline and to continue current therapy. He thought his SOB was likely multifactorial and anemia also playing a role.  -- BNP elevated at 4937. D-Dimer elevated at 2.6. CTA with no acute PE. No s/s of volume overload  Chronic systolic CHF- 2 D ECHO with LVEF 40-45%, mid to distal inferoapical and apical hypokinesis, diastolic dysfunction with elevated LV filling pressure, mild to moderate RV hypokinesis.  Wegener's granulomatosis. ( per PCCM)  -- Continue cytoxan, prednisone  --  Bactrim for PCP prophylaxis  -- Will need pulmonary rehab as an outpatient  Chronic renal insufficiency, stage III (moderate).  -- Creat elevated but stable after contrast exposure for CTA  -- Creat stable at 1.48 today. Continue to monitor  The patient has had a prolonged hospital course but is recovering well. The femoral catheter site is stable. He has been seen by Dr. Irish Lack today and deemed ready for discharge home. All follow-up appointments have been scheduled. Discharge medications are listed below.     04/11/2014 ext post hosp  f/u ov/Malania Gawthrop re: bronchiectasis / angina resolved p stent Chief Complaint  Patient presents with  . Follow-up    couging yellowish    Not using vest yet due to concerns with stent and plans on talking to cards first. cipro best recent rx for purulent sputum but doesn't have a prn on hand  maint on pred 10 mg per day and cytoxan 50     No obvious day to day or daytime variabilty or cp or chest tightness, subjective wheeze overt   hb symptoms. No unusual exp hx or h/o childhood pna/ asthma or knowledge of premature birth.  Sleeping ok without nocturnal  or early am exacerbation  of respiratory  c/o's or need for noct saba. Also denies any obvious fluctuation of symptoms with weather or environmental changes or other aggravating or alleviating factors except as outlined above   Current Medications, Allergies, Complete Past Medical History, Past Surgical History, Family History, and Social History were reviewed in [employer] record.  ROS  The following are not active complaints unless bolded sore throat, dysphagia, dental problems, itching, sneezing,  nasal congestion or excess/ purulent secretions, ear ache,   fever, chills, sweats, unintended wt loss, pleuritic or exertional cp, hemoptysis,  orthopnea pnd or leg swelling, presyncope, palpitations, heartburn, abdominal pain, anorexia, nausea, vomiting, diarrhea  or change in bowel or  urinary habits, change in stools or urine, dysuria,hematuria,  rash, arthralgias, visual complaints, headache, numbness weakness or ataxia or problems with walking or coordination,  change in mood/affect or memory.               Objective:   Physical Exam  wt   128  04/26/11 >07/08/2011  134> 134 11/28/2011 > 02/29/2012 136> 05/31/2012  136>  134 11/22/12 > 07/26/2013  136 > 08/21/2013  136 >133 09/06/2013 >  09/12/2013  133 > 132 09/27/2013 > 10/15/2013 131 > 11/07/2013  128 > 01/09/14 124 > 02/12/2014  123 > 04/11/2014   117   amb thin pleasant wm nad  SKIN: no rash, lesions  NODES: no lymphadenopathy  HEENT: [place]/AT, EOM- WNL, Conjuctivae- clear, PERRLA, TM-WNL, Nose- clear, Throat- clear and wnl, Mallampati III  NECK: Supple w/ fair ROM, JVD- none, normal carotid impulses w/o bruits Thyroid-  CHEST: min bilateral exp rhonchi  No resp distress HEART: RRR, no m/g/r heard  ABDOMEN: Soft and nl EXT:  No deformities or restrucitons     CTa 03/23/14  1. No evidence of a pulmonary embolus.  2. There are lung abnormalities with peribronchial interstitial  thickening most evident in the lower lobes, right middle lobe and  inferior left upper lobe, areas of mucous plugging in bronchi, and  other smaller areas of ill-defined focal airspace opacity. Acute  bronchitis with areas of multifocal pneumonia is suspected. The  bronchial mucous plugging and interstitial thickening may be chronic  with the small focal areas of lung opacity reflecting a more chronic  process such as organizing pneumonia.     Lab Results  Component Value Date   HGB 9.2* 04/01/2014   HGB 7.7* 03/26/2014   HGB 7.9* 03/25/2014       Chemistry      Component Value Date/Time   NA 136 04/09/2014 1401   K 5.2 04/09/2014 1401   CL 105 04/09/2014 1401   CO2 22 04/09/2014 1401   BUN 27* 04/09/2014 1401   CREATININE 1.70* 04/09/2014 1401   CREATININE 1.48* 03/26/2014 0520      Component Value Date/Time   CALCIUM 8.4 04/09/2014 1401   ALKPHOS  78 03/14/2014  0555   AST 11 03/14/2014 0555   ALT 7 03/14/2014 0555   BILITOT <0.2* 03/14/2014 0555            Lab Results  Component Value Date   PROBNP 4937.0* 03/22/2014          Assessment & Plan:

## 2014-04-11 NOTE — Patient Instructions (Addendum)
Cipro 500 mg twice daily x 10 days as needed for nasty mucus   Please schedule a follow up office visit in 6 weeks, call sooner if needed with cxr on return

## 2014-04-12 NOTE — Assessment & Plan Note (Addendum)
-    HFA 90% p coaching 12/16/2010   -   PFT's 06/21/11   FEV1  1.60 (61%) ratio 48 and 10% better p B2,  DLCO 83%  - CT 10/04/13 severe multifocal bronchiectasis as well as numerous cavitary and      non-cavitary nodules  -   PFT's 11/22/2012 FEV1  1.52 (62%) arnd 46 and no change p B2 DLCO 89%  -    VEST rec 02/12/2014 >>  Advised on short course cipro for change in mucus   See instructions for specific recommendations which were reviewed directly with the patient who was given a copy with highlighter outlining the key components.

## 2014-04-12 NOTE — Assessment & Plan Note (Signed)
-  Dx by TBBX/ pos anca 03/2010 > CTX started    - Off all prednisone with baseline esr 31 11/16/2010  > restarted 12/16/2010 with nausea and wt down to 115 but ok sats walking x 3 laps   - Recheck ANCA sent  12/16/2010  >  neg   -Trial off cytoxan starting 02/12/2011 >   03/16/11 Cytoxan stopped  - Prednisone ceiling is 10 mg per day and floor is 5 mg daily as of 04/26/13  - 08/19/13 ANCA POS 1:80 with increasing nodular dz > rx 08/21/2013 cytoxan 50 mg daily  - Started bactrim ds one daily 10/07/13 - ESR up 11/07/13 rec double pred to 20 mg until feeling better then floor of 10 mg > esr 48  02/12/14 on 10 mg pred daily   No evidence of active vasculitis > no change rx

## 2014-04-16 LAB — BASIC METABOLIC PANEL WITH GFR
BUN: 26 mg/dL — AB (ref 6–23)
CHLORIDE: 104 meq/L (ref 96–112)
CO2: 22 meq/L (ref 19–32)
CREATININE: 1.61 mg/dL — AB (ref 0.50–1.35)
Calcium: 8.6 mg/dL (ref 8.4–10.5)
GFR, Est African American: 48 mL/min — ABNORMAL LOW
GFR, Est Non African American: 41 mL/min — ABNORMAL LOW
GLUCOSE: 195 mg/dL — AB (ref 70–99)
POTASSIUM: 5.2 meq/L (ref 3.5–5.3)
Sodium: 137 mEq/L (ref 135–145)

## 2014-04-17 ENCOUNTER — Encounter: Payer: Self-pay | Admitting: Internal Medicine

## 2014-04-17 ENCOUNTER — Telehealth: Payer: Self-pay | Admitting: *Deleted

## 2014-04-17 ENCOUNTER — Ambulatory Visit (INDEPENDENT_AMBULATORY_CARE_PROVIDER_SITE_OTHER): Payer: Medicare Other | Admitting: Internal Medicine

## 2014-04-17 VITALS — BP 110/70 | HR 86 | Ht 67.0 in | Wt 115.8 lb

## 2014-04-17 DIAGNOSIS — R531 Weakness: Secondary | ICD-10-CM

## 2014-04-17 DIAGNOSIS — Z79899 Other long term (current) drug therapy: Secondary | ICD-10-CM

## 2014-04-17 DIAGNOSIS — E875 Hyperkalemia: Secondary | ICD-10-CM

## 2014-04-17 DIAGNOSIS — M3130 Wegener's granulomatosis without renal involvement: Secondary | ICD-10-CM

## 2014-04-17 DIAGNOSIS — I2510 Atherosclerotic heart disease of native coronary artery without angina pectoris: Secondary | ICD-10-CM

## 2014-04-17 DIAGNOSIS — N183 Chronic kidney disease, stage 3 unspecified: Secondary | ICD-10-CM

## 2014-04-17 DIAGNOSIS — R5383 Other fatigue: Secondary | ICD-10-CM

## 2014-04-17 DIAGNOSIS — I255 Ischemic cardiomyopathy: Secondary | ICD-10-CM

## 2014-04-17 DIAGNOSIS — N189 Chronic kidney disease, unspecified: Secondary | ICD-10-CM

## 2014-04-17 DIAGNOSIS — R5381 Other malaise: Secondary | ICD-10-CM

## 2014-04-17 DIAGNOSIS — I2589 Other forms of chronic ischemic heart disease: Secondary | ICD-10-CM

## 2014-04-17 MED ORDER — SODIUM BICARBONATE 650 MG PO TABS
1300.0000 mg | ORAL_TABLET | Freq: Two times a day (BID) | ORAL | Status: DC
Start: 2014-04-17 — End: 2014-11-10

## 2014-04-17 NOTE — Telephone Encounter (Signed)
Spoke to patient. Result given . Verbalized understanding  Labs ordered bmp - will go the week of Sept 28,2015

## 2014-04-17 NOTE — Progress Notes (Signed)
OFFICE NOTE  Chief Complaint:  Chest pressure, dypsnea  Primary Care Physician: [name], MD  HPI:  Richard [name] is a 78 y.o. male here for follow up after complex hospitalization, with a history of hypothyroidism, HLD, anemia, Wegener's granulomatosis and no prior cardiac disease who presented to Hospital with chest pain 03/13/14, + nuc study and cardiac cath 03/14/14 which revealed severe multivessel CAD involving all 4 major branches. Seen by Dr. Madolyn Frieze who deemed him unsuitable for CABG due to underlying comorbid conditions. ( sts score >30). It was elected to proceed with staged multivessel PCI . On 03/17/14 he had successful PCI to the LAD and POBA to a chronically occluded LCX. There remained high grade proximal disease to a large proximal Ramus branch as well as mid-RCA disease so staged PCI was planned at a later date due to pulmonary issues and renal dysfunction. This was further complicated by right femoral artery pseudoaneurysm requiring repair. PCI on 03/21/14 with rotablator atherectomy with BMS to the mid RCA. Due to length of fluoro time and large volume of contrast, it was decided to medically manage the ramus stenosis which Dr. Angelena Form felt was moderately stenosed and if he has recurrent anginal then consider PCI.   He recently was seen by one of our nurse practitioners who has been working on him to evaluate his anemia. He was also noted to be hyperkalemic. This is required for treatment with Kayexalate and dietary changes. Unfortunate dietary changes as caused him to lose a significant amount of weight. The family is now concerned about what he can do in order to keep his potassium down. He does have stage III chronic kidney disease.   PMHx:  Past Medical History  Diagnosis Date  . GERD (gastroesophageal reflux disease)   . Asthma   . Fungal infection     lungs  . Cataract   . BPH (benign prostatic hyperplasia)     sees Dr. Risa Grill, biopsy June 2015 was benign   .  Wegener's granulomatosis     sees Dr. [name]   . Peptic stricture of esophagus   . Anemia   . Hiatal hernia   . Adenomatous colon polyp   . Bronchiectasis   . On home oxygen therapy     uses 2 l/m nasally at bedtime  . HOH (hard of hearing)     bilaterally  . HTN (hypertension) 09/28/2013  . CAD (coronary artery disease)     a. [place] s/p [place] x3 and ultimately BMS to pLAD& POBA to CTO of circumflex/marginal vessel on 03/17/14 and staged BMS to [facility] on 03/21/14  . Femoral artery pseudo-aneurysm, right     a. s/p repair. Follow up with Dr. Kellie Simmering     Past Surgical History  Procedure Laterality Date  . Hernia repair    . Eye surgery      cataracts removed.   . Colonoscopy  08-16-11    per Dr. Fuller Plan, diverticulosis and polyps, repeat in 5 yrs   . Esophagogastroduodenoscopy (egd) with esophageal dilation  11-29-10    per Dr. Fuller Plan   . Cataract extraction, bilateral  12-18-12    bilateral  . Cholecystectomy N/A 12/21/2012    Procedure: LAPAROSCOPIC CHOLECYSTECTOMY WITH INTRAOPERATIVE CHOLANGIOGRAM;  Surgeon: Edward Jolly, MD;  Location: [facility];  Service: General;  Laterality: N/A;  . Hematoma evacuation Right 03/19/2014    Procedure: Suture repair of femoral artery with evacuation of hematoma;  Surgeon: Mal Misty, MD;  Location: [facility];  Service: Vascular;  Laterality: Right;    FAMHx:  Family History  Problem Relation Age of Onset  . Asthma Father   . Coronary artery disease Brother   . Coronary artery disease Brother   . Coronary artery disease Mother     SOCHx:   reports that he has never smoked. He has never used smokeless tobacco. He reports that he does not drink alcohol or use illicit drugs.  ALLERGIES:  Allergies  Allergen Reactions  . Factive [Gemifloxacin]     Ineffective     ROS: A comprehensive review of systems was negative except for: Constitutional: positive for fatigue and weight loss Respiratory: positive for dyspnea on exertion  HOME MEDS: Current  Outpatient Prescriptions  Medication Sig Dispense Refill  . acetaminophen (TYLENOL) 325 MG tablet Take 650 mg by mouth every 6 (six) hours as needed for mild pain.      Marland Kitchen aspirin 81 MG tablet Take 81 mg by mouth every evening.       Marland Kitchen b complex vitamins tablet Take 1 tablet by mouth daily.        Marland Kitchen BIOTIN PO Take 1 tablet by mouth 3 (three) times daily with meals.      . bisacodyl (DULCOLAX) 5 MG EC tablet Take 5 mg by mouth daily as needed. CONSTIPATION      . bisoprolol (ZEBETA) 5 MG tablet Take 0.5 tablets (2.5 mg total) by mouth daily.  30 tablet  3  . budesonide-formoterol (SYMBICORT) 160-4.5 MCG/ACT inhaler Inhale 2 puffs into the lungs 2 (two) times daily.      . ciprofloxacin (CIPRO) 500 MG tablet Take 1 tablet (500 mg total) by mouth 2 (two) times daily.  20 tablet  11  . clopidogrel (PLAVIX) 75 MG tablet Take 1 tablet (75 mg total) by mouth daily with breakfast.  30 tablet  1  . cyclophosphamide (CYTOXAN) 50 MG tablet Take 50 mg by mouth daily. Give on an empty stomach once daily at 1600. Take  1 hour before or 2 hours after meals.      Marland Kitchen dextromethorphan (DELSYM) 30 MG/5ML liquid Take 30 mg by mouth 2 (two) times daily as needed for cough.      . dextromethorphan-guaiFENesin (MUCINEX DM) 30-600 MG per 12 hr tablet Take 1 tablet by mouth 2 (two) times daily.       Marland Kitchen dutasteride (AVODART) 0.5 MG capsule Take 0.5 mg by mouth every other day. In the evening.      . Esomeprazole Magnesium (NEXIUM PO) Take 22.5 mg by mouth 2 (two) times daily.      . ferrous sulfate 325 (65 FE) MG tablet Take 1 tablet (325 mg total) by mouth daily with breakfast.  30 tablet  11  . levalbuterol (XOPENEX HFA) 45 MCG/ACT inhaler Inhale 1-2 puffs into the lungs every 4 (four) hours as needed for wheezing.  1 Inhaler  12  . loratadine (CLARITIN) 10 MG tablet Take 10 mg by mouth daily.       . methylcellulose (CITRUCEL) oral powder Take 1 packet by mouth daily.      . nitroGLYCERIN (NITROSTAT) 0.4 MG SL tablet  Place 1 tablet (0.4 mg total) under the tongue every 5 (five) minutes x 3 doses as needed for chest pain.  25 tablet  12  . predniSONE (DELTASONE) 10 MG tablet Take 10 mg by mouth daily with breakfast.      . Simethicone (GAS-X PO) Take 2 tablets by mouth 2 (two) times daily.       Marland Kitchen  simvastatin (ZOCOR) 20 MG tablet Take 1 tablet (20 mg total) by mouth daily at 6 PM.  30 tablet  11  . sulfamethoxazole-trimethoprim (BACTRIM DS) 800-160 MG per tablet Take 1 tablet by mouth daily. Take with Cytoxan.      . tamsulosin (FLOMAX) 0.4 MG CAPS capsule Take 1 capsule (0.4 mg total) by mouth every evening.  30 capsule  11  . traMADol (ULTRAM) 50 MG tablet 1-2 every 4 hours as needed for cough or pain  40 tablet  0  . sodium bicarbonate 650 MG tablet Take 2 tablets (1,300 mg total) by mouth 2 (two) times daily.  120 tablet  6   No current facility-administered medications for this visit.    LABS/IMAGING: No results found for this or any previous visit (from the past 48 hour(s)). No results found.  VITALS: BP 110/70  Pulse 86  Ht 5\' 7"  (1.702 m)  Wt 115 lb 12.8 oz (52.527 kg)  BMI 18.13 kg/m2  EXAM: General appearance: alert, appears older than stated age and no distress Neck: no carotid bruit and no JVD Lungs: diminished breath sounds bilaterally Heart: regular rate and rhythm, S1, S2 normal, no murmur, click, rub or gallop Abdomen: soft, non-tender; bowel sounds normal; no masses,  no organomegaly Extremities: extremities normal, atraumatic, no cyanosis or edema Pulses: 2+ and symmetric Skin: Pale, cool skin Neurologic: Grossly normal Psych: Normal  EKG: deferred  ASSESSMENT: 1. History of Wegener's granulomatosis on immunosuppression 2. Multivessel CAD s/p PCI to the LAD, LCX and RCA 3. Stage III CKD 4. Hyperkalemia 5. Ischemic cardiomyopathy, EF 40% 6.   Fatigue/weakness  PLAN: 1.   Richard [name] unfortunately continues to feel weak and fatigued. He is short of breath without a  notable improvement after multivessel PCI. He was complicated by a pseudoaneurysm and ultimately underwent surgical repair of this. He had initial anemia however that his recovered. He is now been released by vascular surgery. I would've expected some improvement in his symptoms with revascularization. Perhaps he needs more time for LV recovery period he continues to have problems with chronic kidney disease and especially hyperkalemia. He has adjusted his diet to reduce potassium however has lost a significant amount of weight. I believe this can be liberalized somewhat and spoke today with Dr. Jimmy Footman, who recommended adding sodium bicarbonate tablets to help with his hyperkalemia. I've also referred him to see him in the office.  We will continue his current medications and plan to see him back in 3 months.  Pixie Casino, MD, [facility] Attending Cardiologist [facility]   Ruthy Forry C 04/17/2014, 5:46 PM

## 2014-04-17 NOTE — Patient Instructions (Addendum)
You have been referred to Dr. Jimmy Footman [facility]) for chronic kidney disease, hyperkalemia (increase potassium)  Your physician has recommended you make the following change in your medication: START bicarbonate (HCO3) 650mg  (take 2 tablets by mouth twice daily)   Your physician wants you to follow-up in: 3 months with Dr. Debara Pickett. You will receive a reminder letter in the mail two months in advance. If you don't receive a letter, please call our office to schedule the follow-up appointment.

## 2014-04-17 NOTE — Telephone Encounter (Signed)
Message copied by Raiford Simmonds on Thu Apr 17, 2014  8:38 AM ------      Message from: Isaiah Serge      Created: Wed Apr 16, 2014  1:40 PM       Labs stable, recheck in 3 weeks, BMP ------

## 2014-04-18 ENCOUNTER — Telehealth: Payer: Self-pay | Admitting: Internal Medicine

## 2014-04-18 IMAGING — CR DG CHEST 2V
2 series · 2 of 2 positions shown · non-contrast
Comparison: 08/19/2013 and 07/26/2013 as well as 02/19/2013

CLINICAL DATA: Cough and dyspnea with congestion.

EXAM:
CHEST  2 VIEW

[view not recorded (1 of 2)]
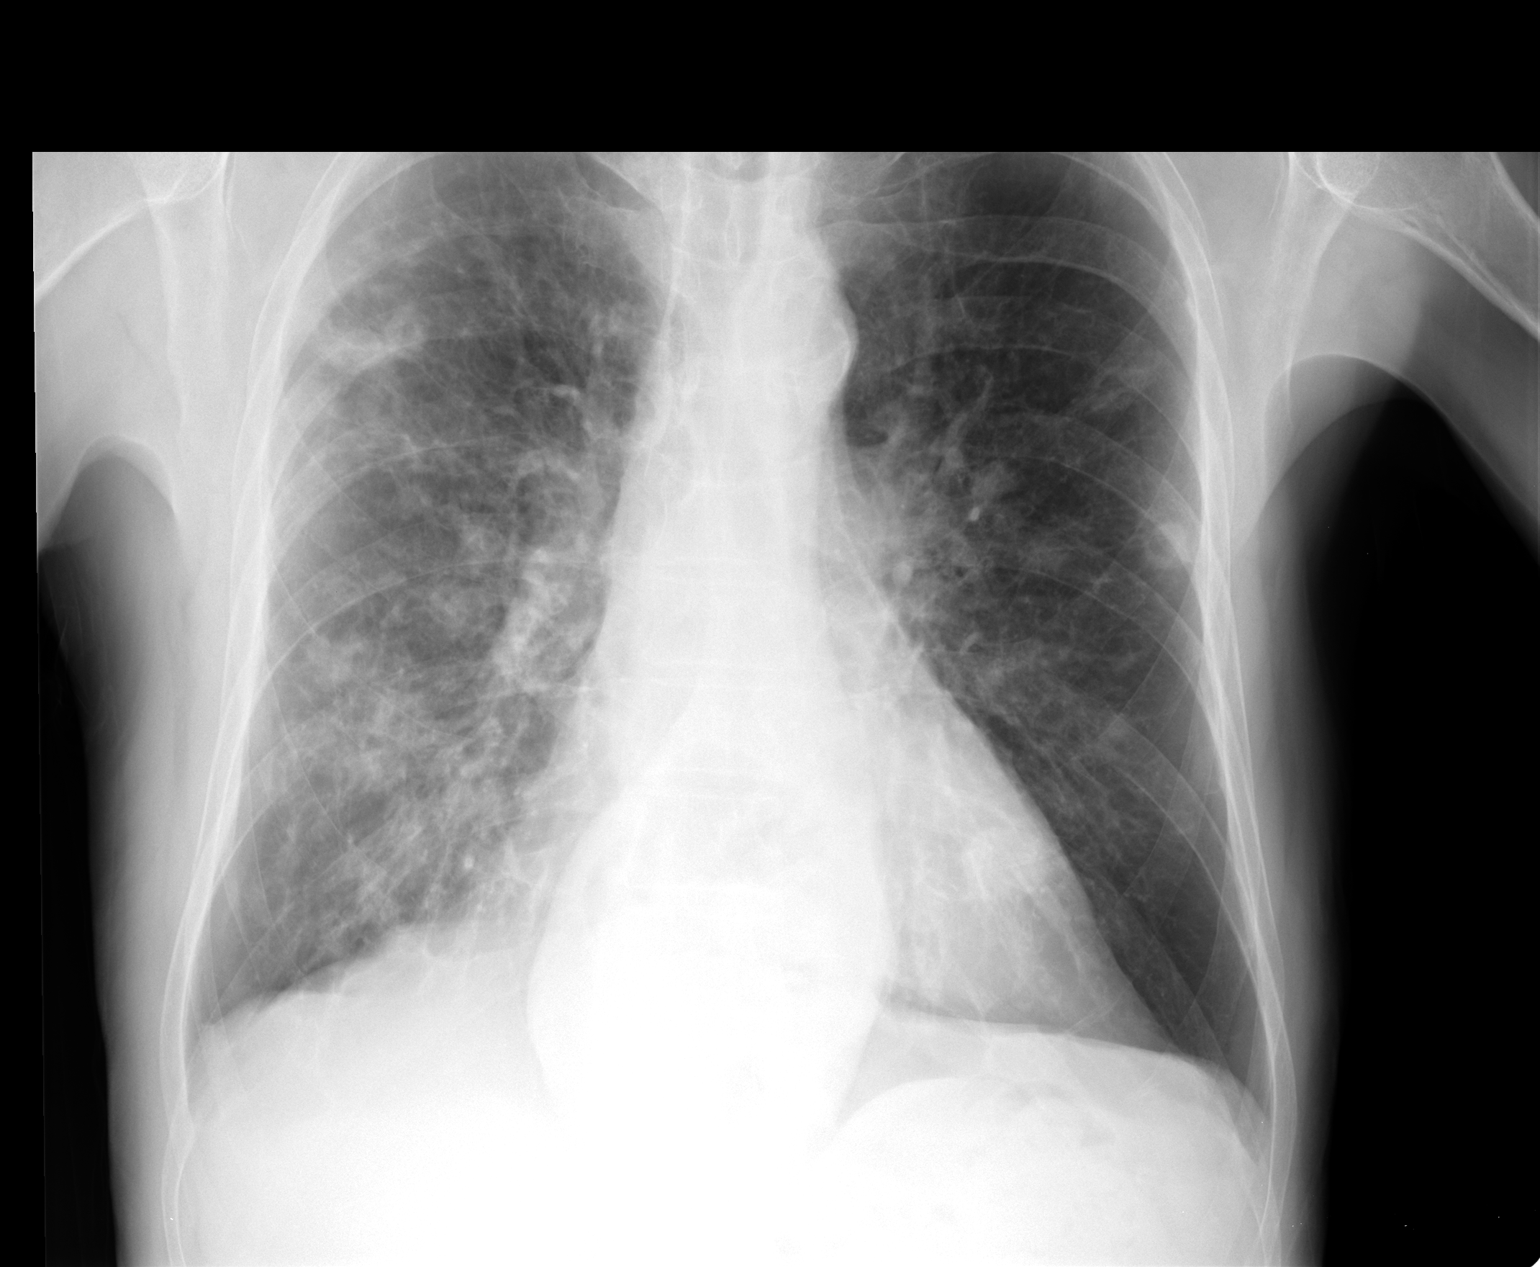

[view not recorded (2 of 2)]
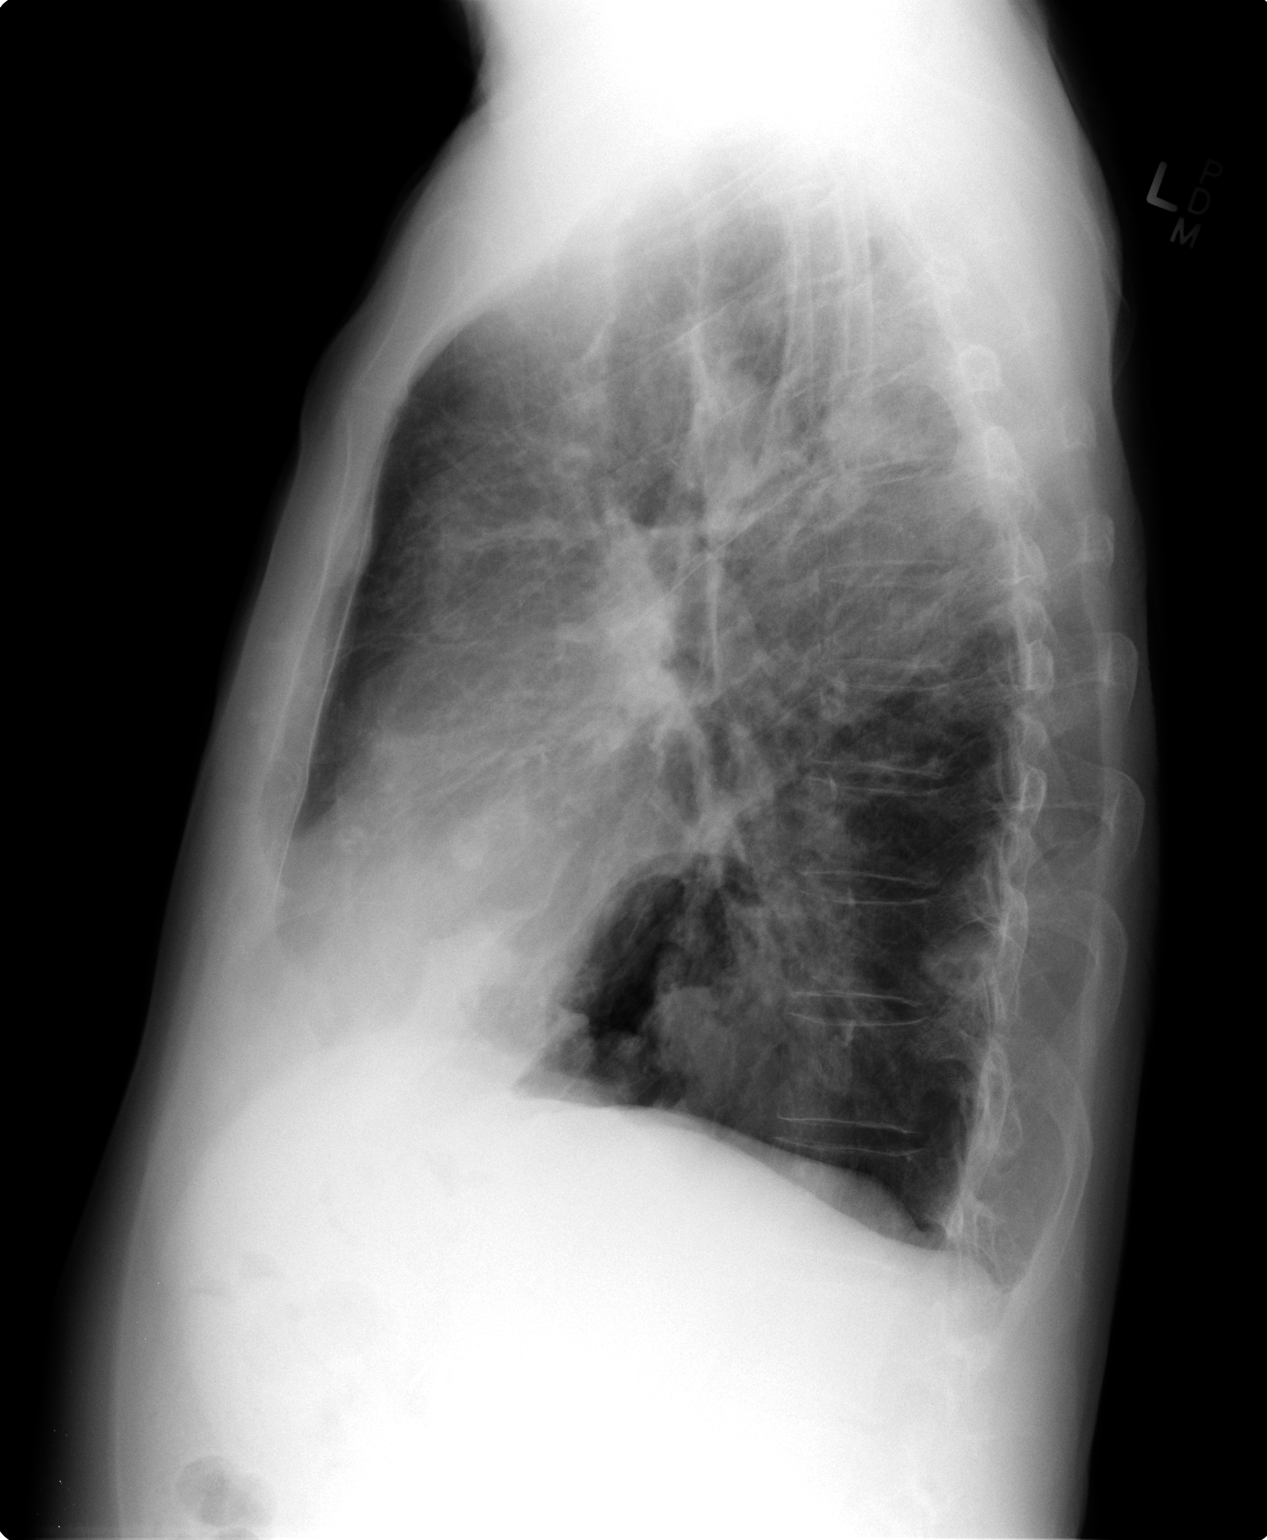

[2 of 2 positions shown; findings below may reference images not displayed]

FINDINGS: Lungs are adequately inflated with chronic interstitial disease
right lung predominant. There continued evidence of a superimposed
nodular airspace process with findings suggesting cavitation in some
of these small nodules. There is no definite effusion. There is
borderline stable cardiomegaly. A moderate size hiatal hernia is
unchanged. Remainder the exam is unchanged.
IMPRESSION: No significant change in a patchy bilateral nodular airspace process
with suggestion of cavitation of some of these nodular foci.
Findings are likely due to infection less likely neoplastic or
inflammatory etiology. Background of mild interstitial disease.

## 2014-04-18 NOTE — Telephone Encounter (Signed)
Mr Richard [name] called in wanting to know what the correct instruction to take the sodium bicarbonate that was prescribed to him yesterday by Dr.Hilty. He would also like to know if this new medication will counteract with his Nexium. Please call  Thanks

## 2014-04-18 NOTE — Telephone Encounter (Signed)
Per Erasmo Downer, the only reason that sodium bicarbonate should need to be dissolved is if it is used for antiacid purposed. Patient voiced understanding.   Of note, has nephrology appointment 9/14

## 2014-04-18 NOTE — Telephone Encounter (Signed)
Spoke with patient. He states instructions from pharmacy stated to dissolve sodium bicarbonate in water completely and patient is wondering why?  Also asked if omeprazole and sodium bicarbonate would interact >> but per Erasmo Downer, will work better.   Will ask Erasmo Downer about dissolving sodium bicarbonate.

## 2014-05-05 ENCOUNTER — Other Ambulatory Visit: Payer: Self-pay | Admitting: Internal Medicine

## 2014-05-07 LAB — BASIC METABOLIC PANEL
BUN: 22 mg/dL (ref 6–23)
CALCIUM: 8.2 mg/dL — AB (ref 8.4–10.5)
CHLORIDE: 105 meq/L (ref 96–112)
CO2: 26 meq/L (ref 19–32)
CREATININE: 1.58 mg/dL — AB (ref 0.50–1.35)
GLUCOSE: 106 mg/dL — AB (ref 70–99)
Potassium: 4.4 mEq/L (ref 3.5–5.3)
Sodium: 141 mEq/L (ref 135–145)

## 2014-05-16 IMAGING — CR DG CHEST 2V
2 series · 2 of 2 positions shown · non-contrast
Comparison: None.

CLINICAL DATA: Cough, fever, [place] granulomatosis

EXAM:
CHEST  2 VIEW

[w chest pa]
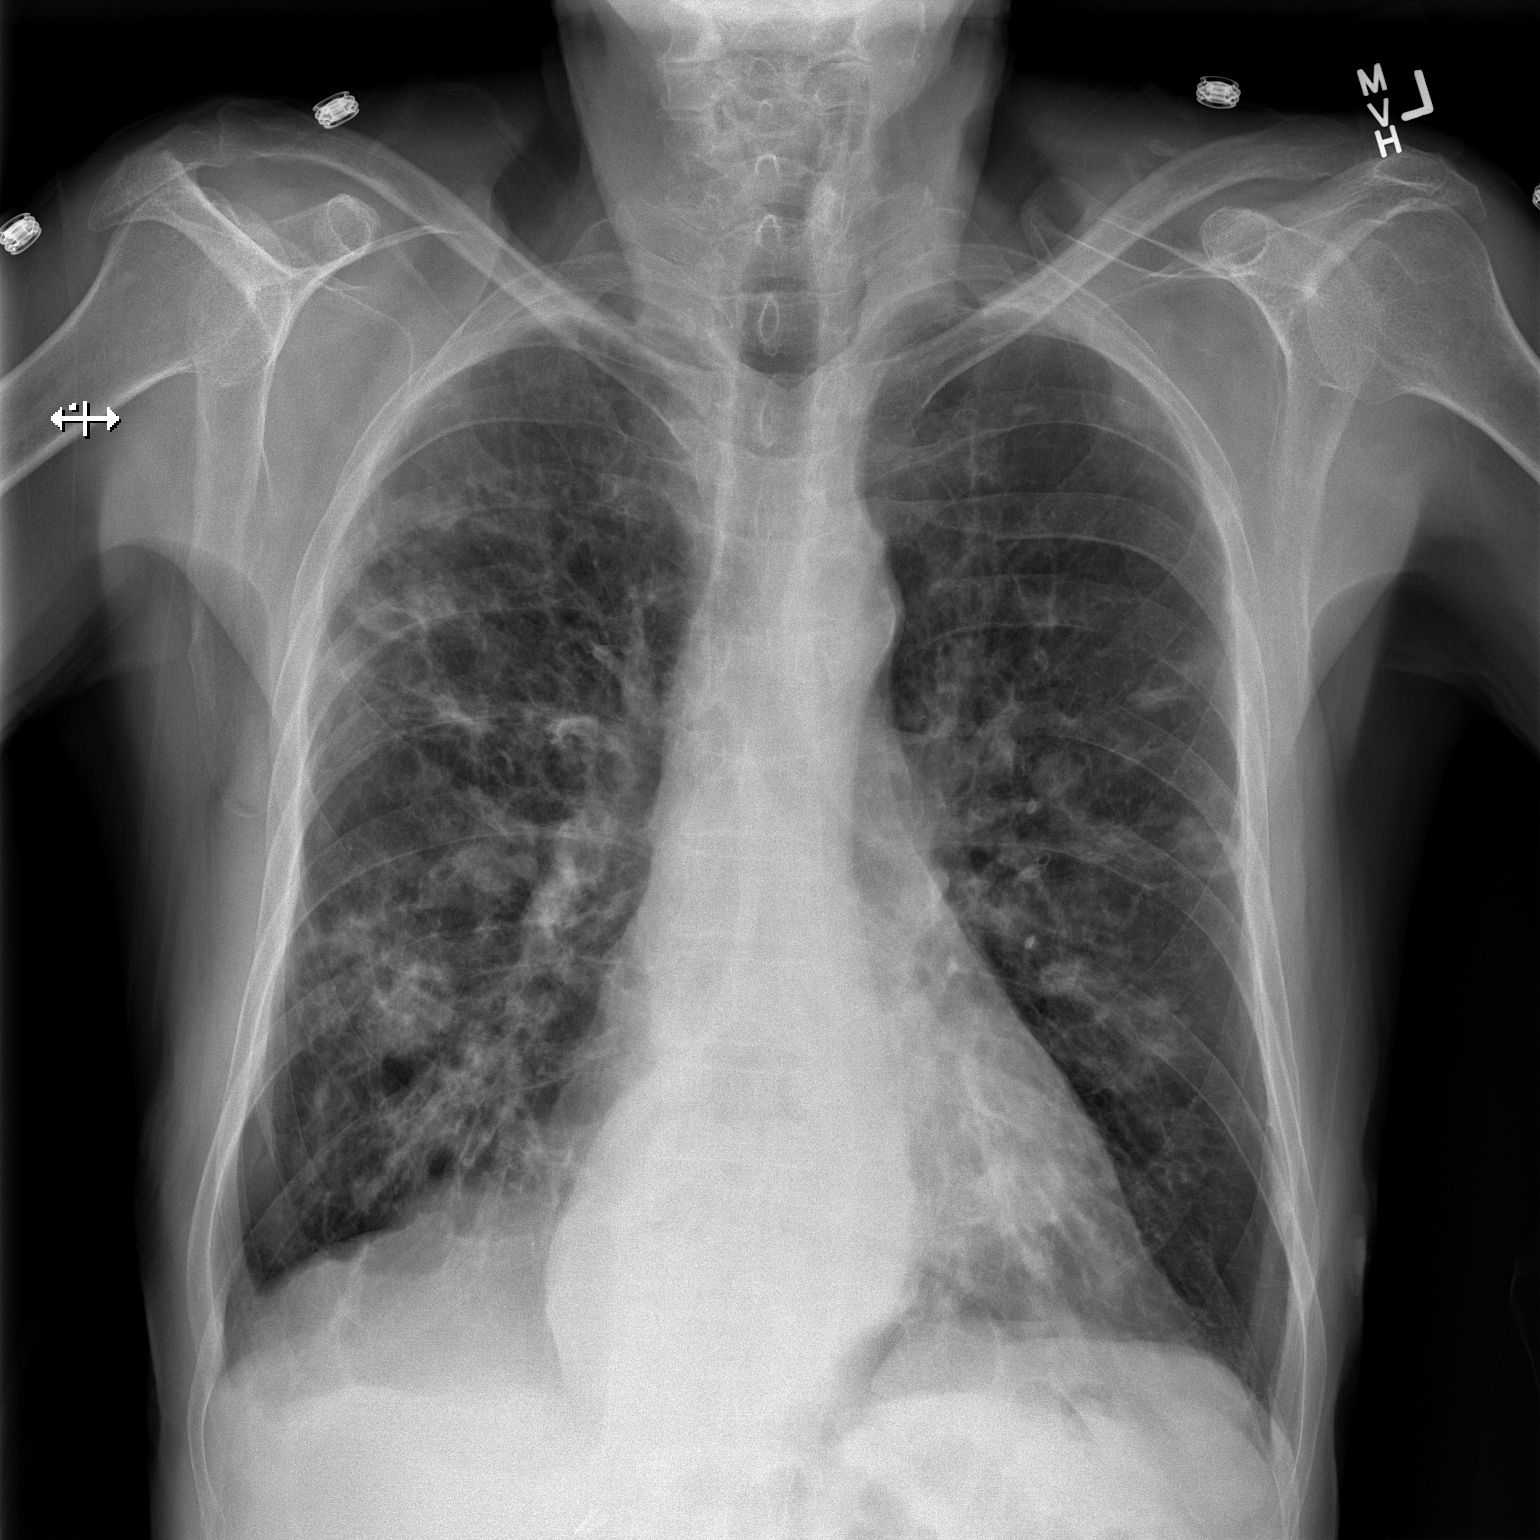

[w chest lat]
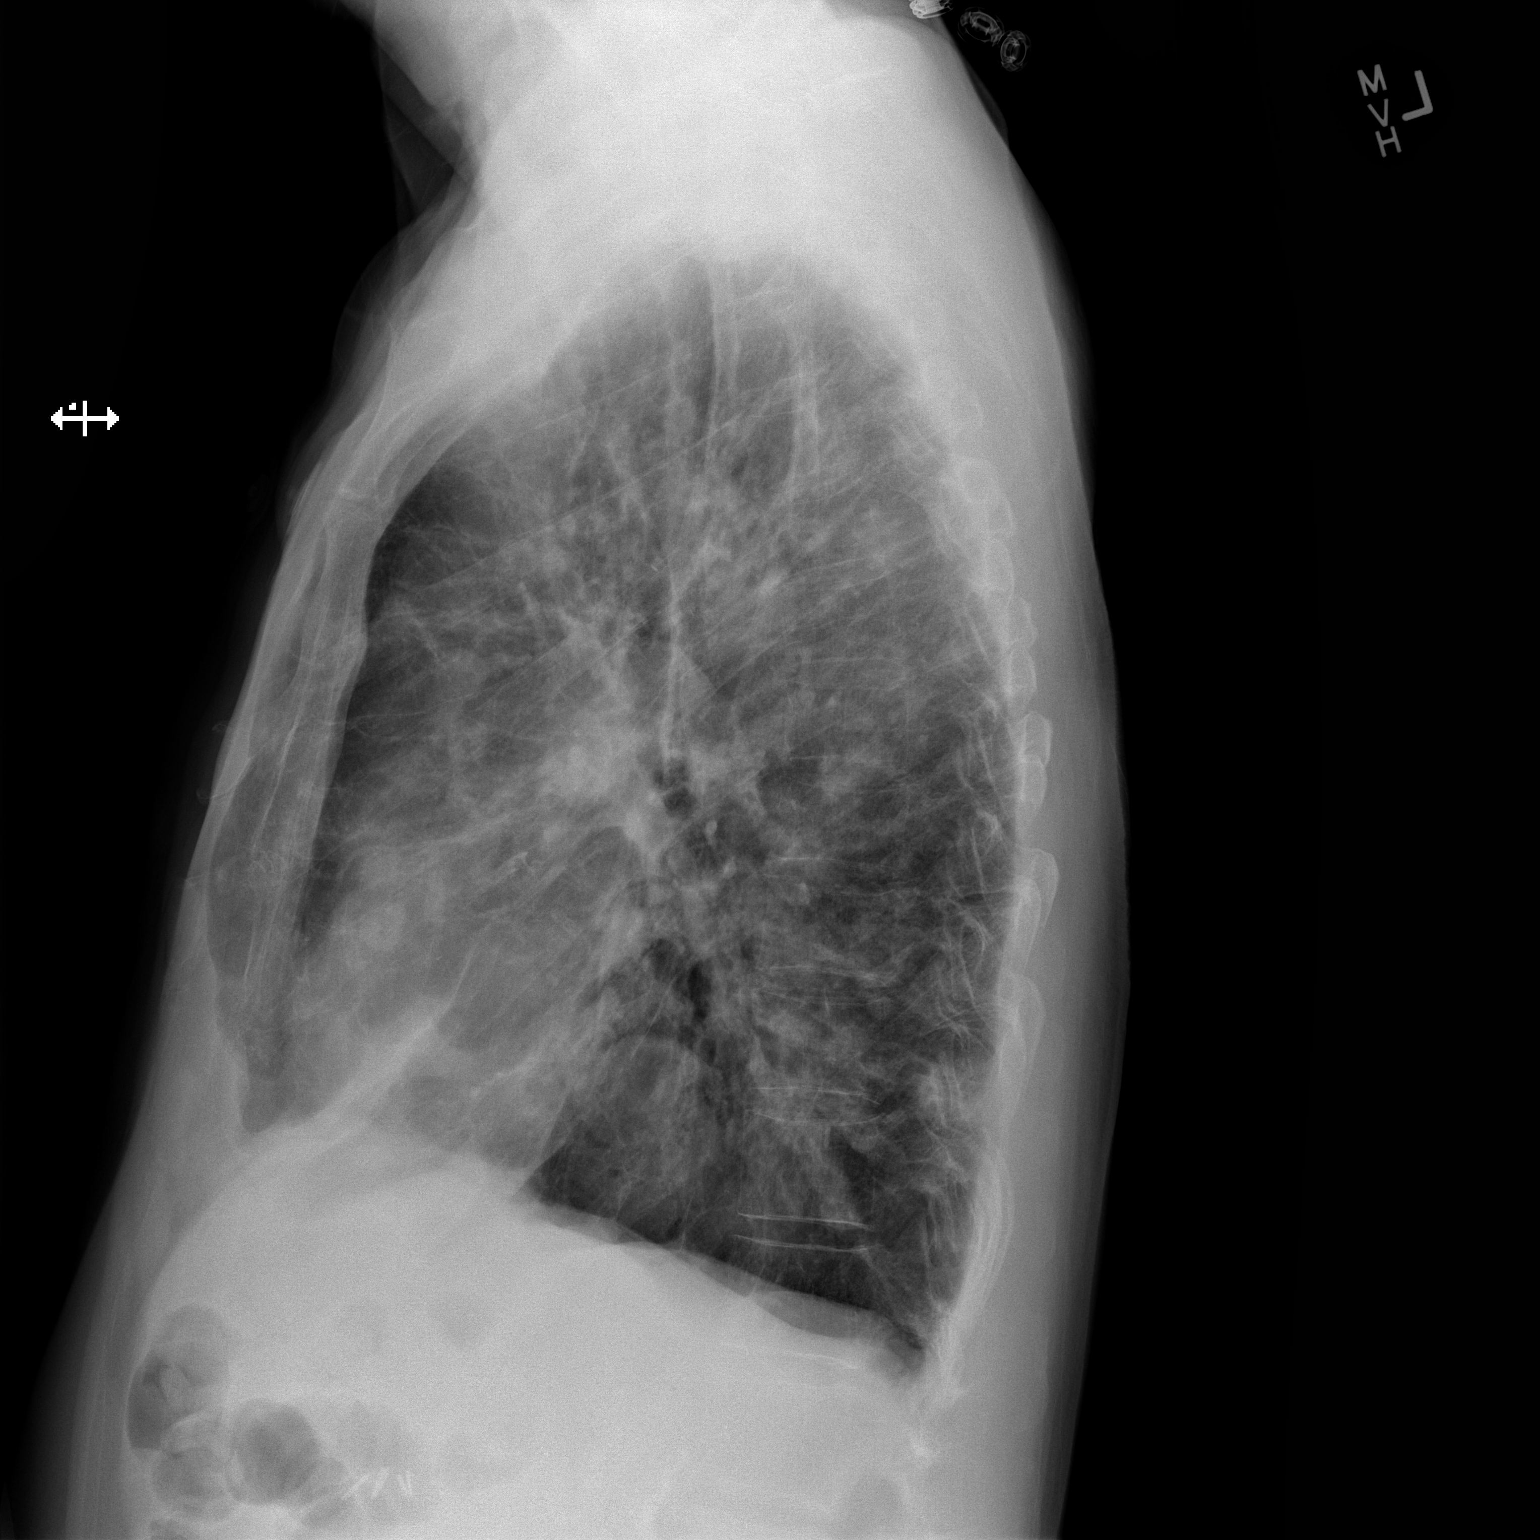

[2 of 2 positions shown; findings below may reference images not displayed]

FINDINGS: Cardiomediastinal silhouette is stable. Bilateral cavitary lung
nodules again noted probable due to granulomatosis. Stable chronic
reticular interstitial prominence. No definite superimposed
infiltrate or pulmonary edema. Bony thorax is stable.
IMPRESSION: No definite infiltrate or pulmonary edema. Bilateral pulmonary
nodules some with cavitation probable due to granulomatosis. Stable
chronic reticular interstitial prominence.

## 2014-05-16 IMAGING — CT CT PARANASAL SINUSES LIMITED
1 series · 7 of 9 positions shown, 9 images · non-contrast
Comparison: CT head 04/11/2010

CLINICAL DATA: Chronic rhinitis

EXAM:
CT PARANASAL SINUS WITHOUT CONTRAST
TECHNIQUE: Multidetector CT images of the paranasal sinuses were obtained using
the standard protocol without intravenous contrast.

[Series 3: sinus st · axial · 0.39mm/px · z∈[+1431,+1491]mm · 7 of 9 slices shown, 9 images]
[im 2/9  brain]
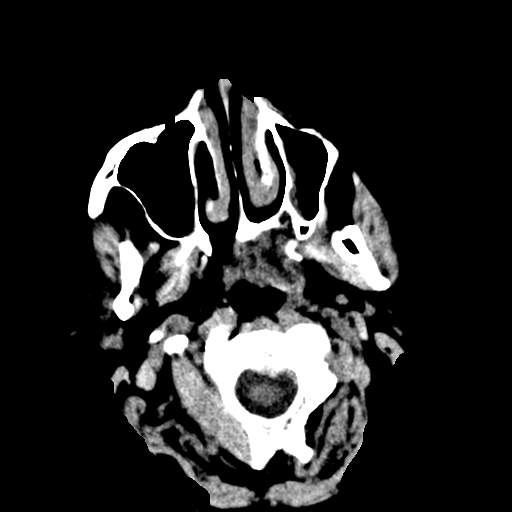
[im 2/9  bone]
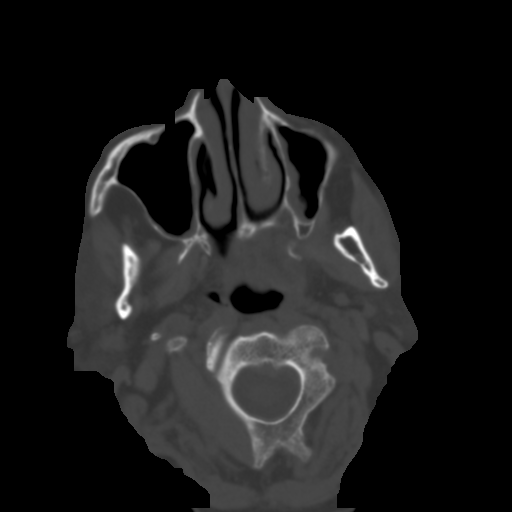
[im 3/9  bone]
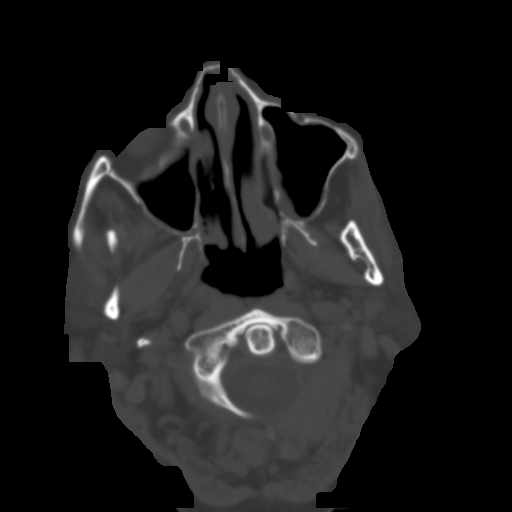
[im 4/9  bone]
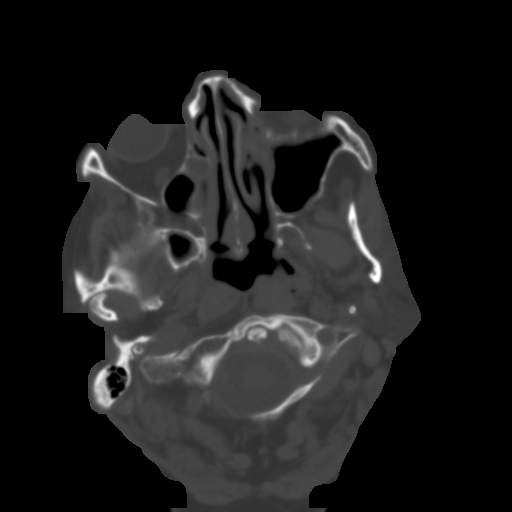
[im 5/9  bone]
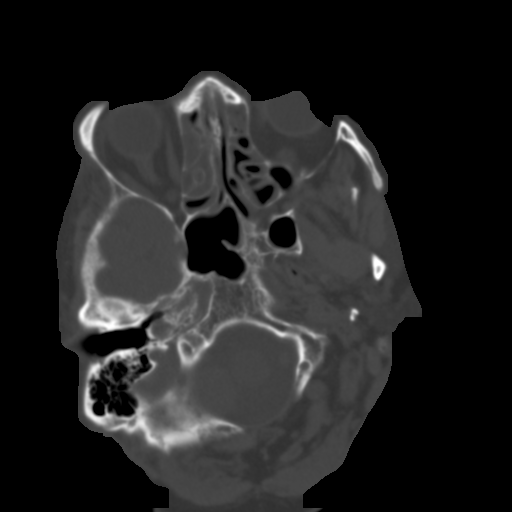
[im 6/9  brain]
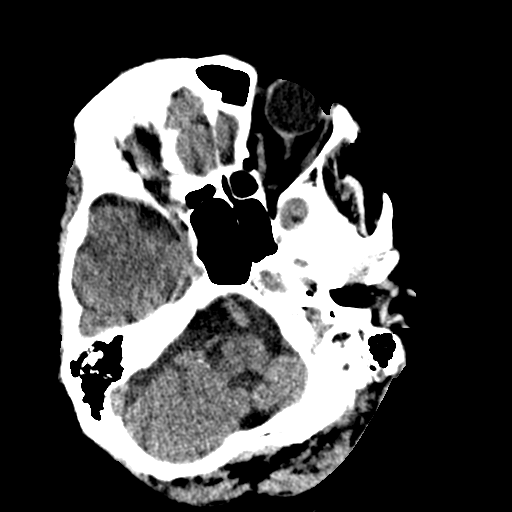
[im 6/9  bone]
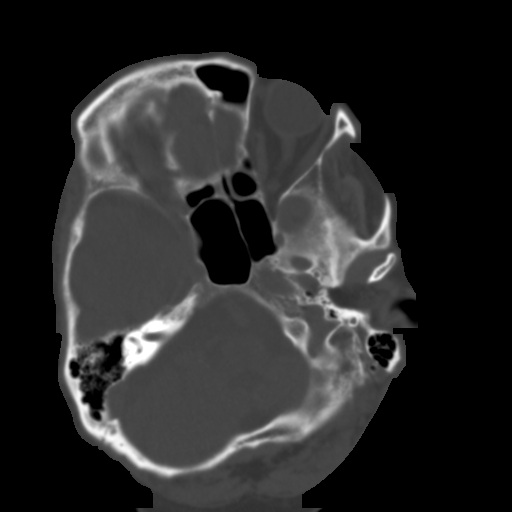
[im 7/9  bone]
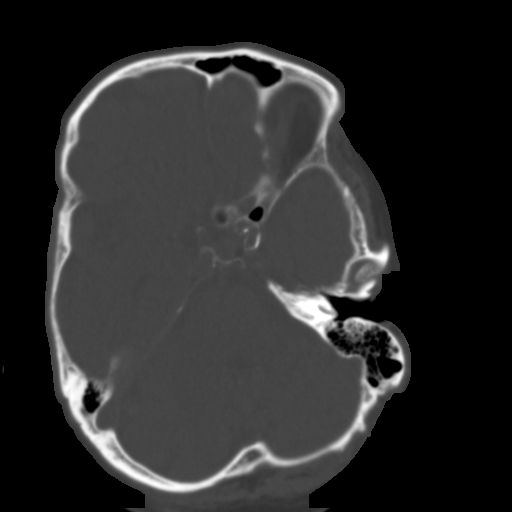
[im 8/9  bone]
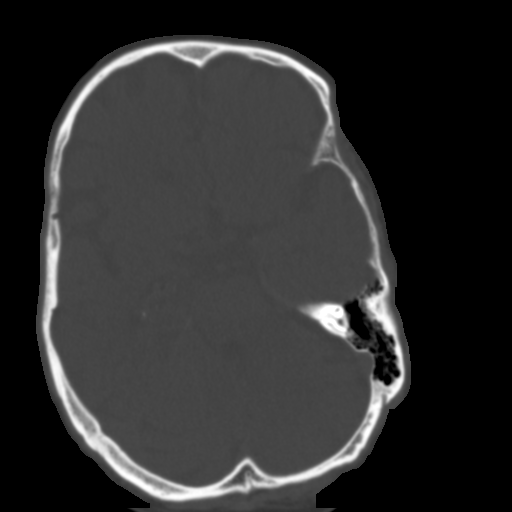

[7 of 9 positions shown; findings below may reference images not displayed]

FINDINGS: Mild to moderate mucosal thickening in the paranasal sinuses
diffusely. Left frontal sinus is clear. Right frontal sinus is
hypoplastic.

Mastoid sinus is clear bilaterally.

No air-fluid level.

No acute bony abnormality.
IMPRESSION: Mild to moderate mucosal thickening in the paranasal sinuses without
air-fluid level.

## 2014-05-19 IMAGING — CR DG CHEST 2V
2 series · 2 of 2 positions shown · non-contrast
Comparison: CT ANGIO CHEST W/CM &/OR WO/CM dated 10/04/2013;

CLINICAL DATA: bronchiectasis, Ourari

EXAM:
CHEST  2 VIEW

[w chest pa]
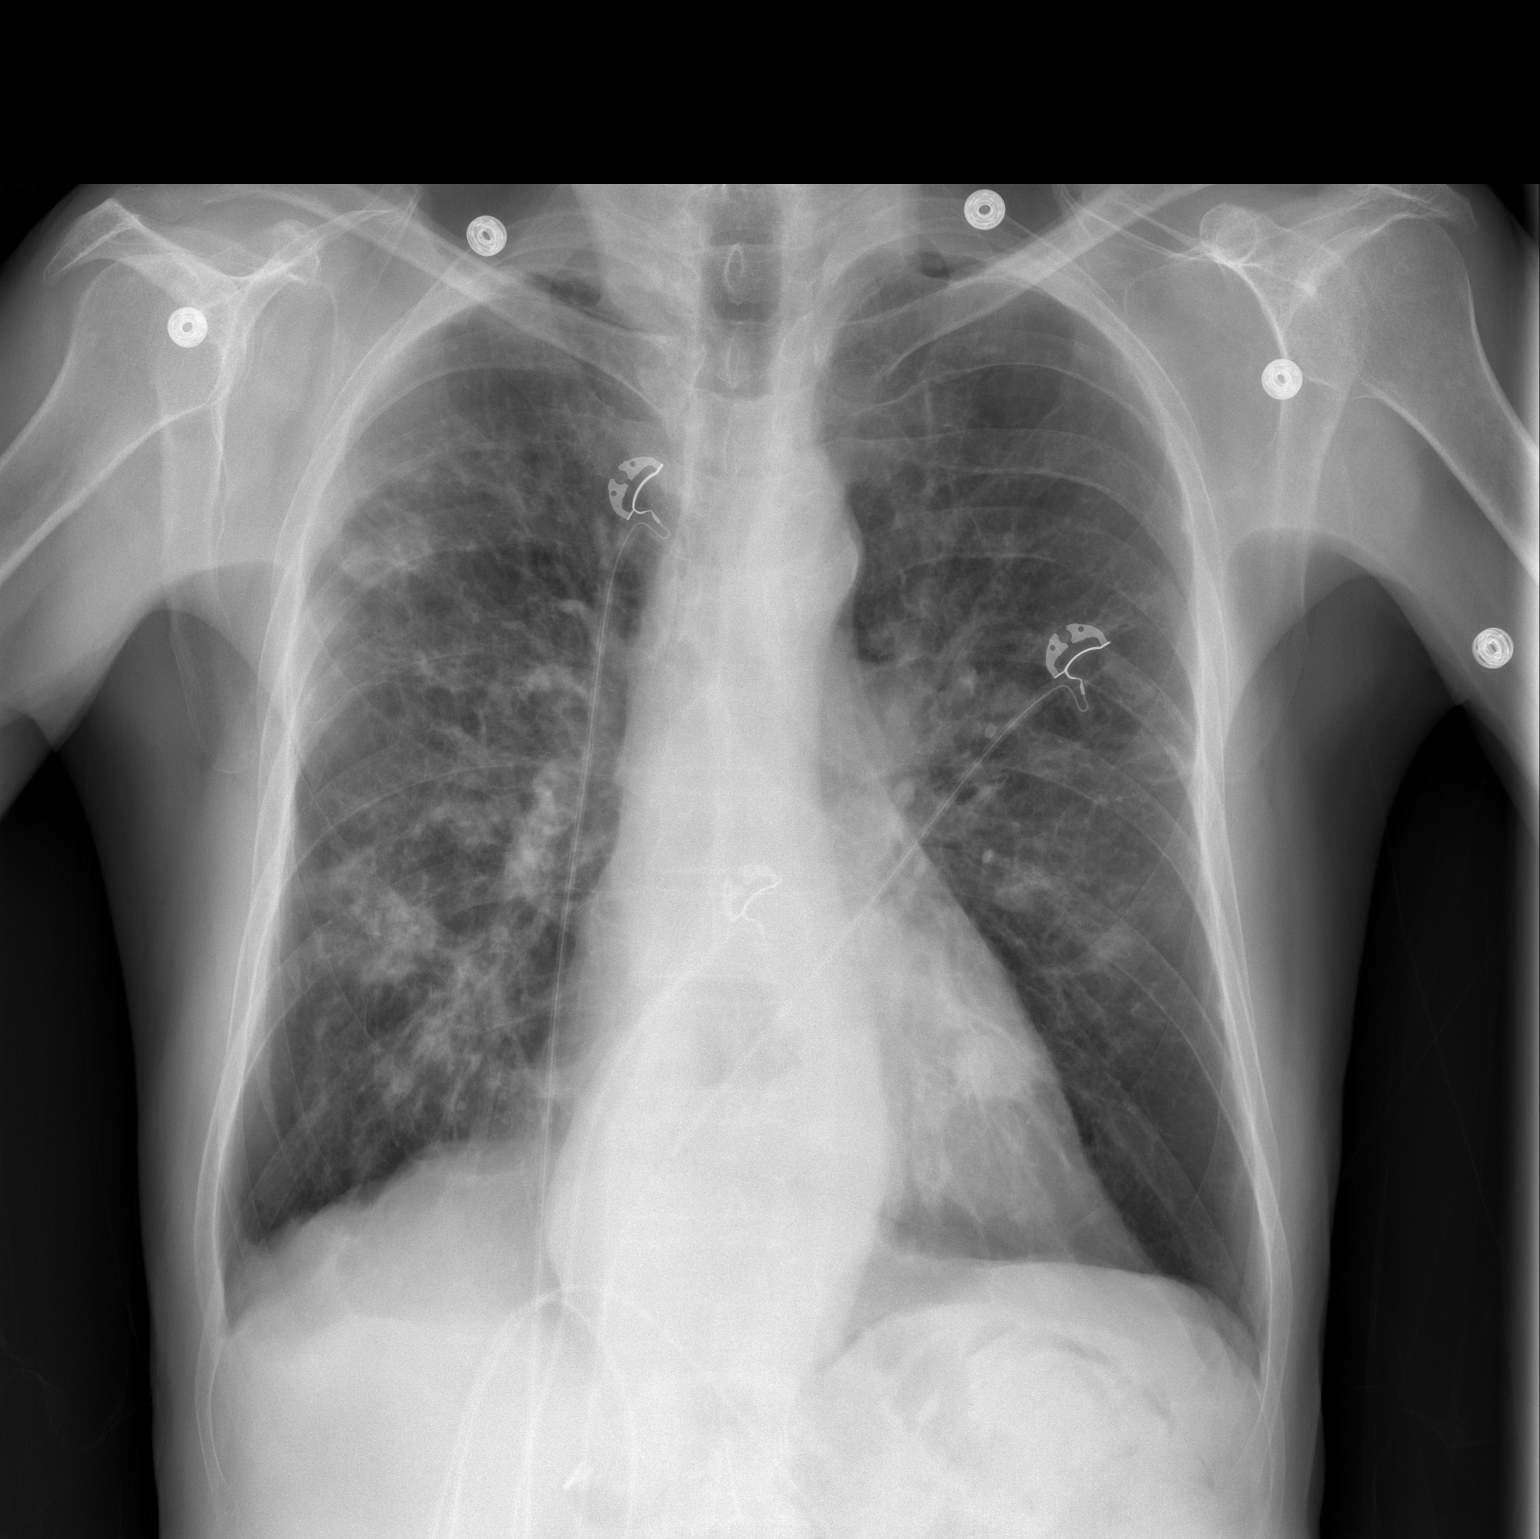

[w chest lat]
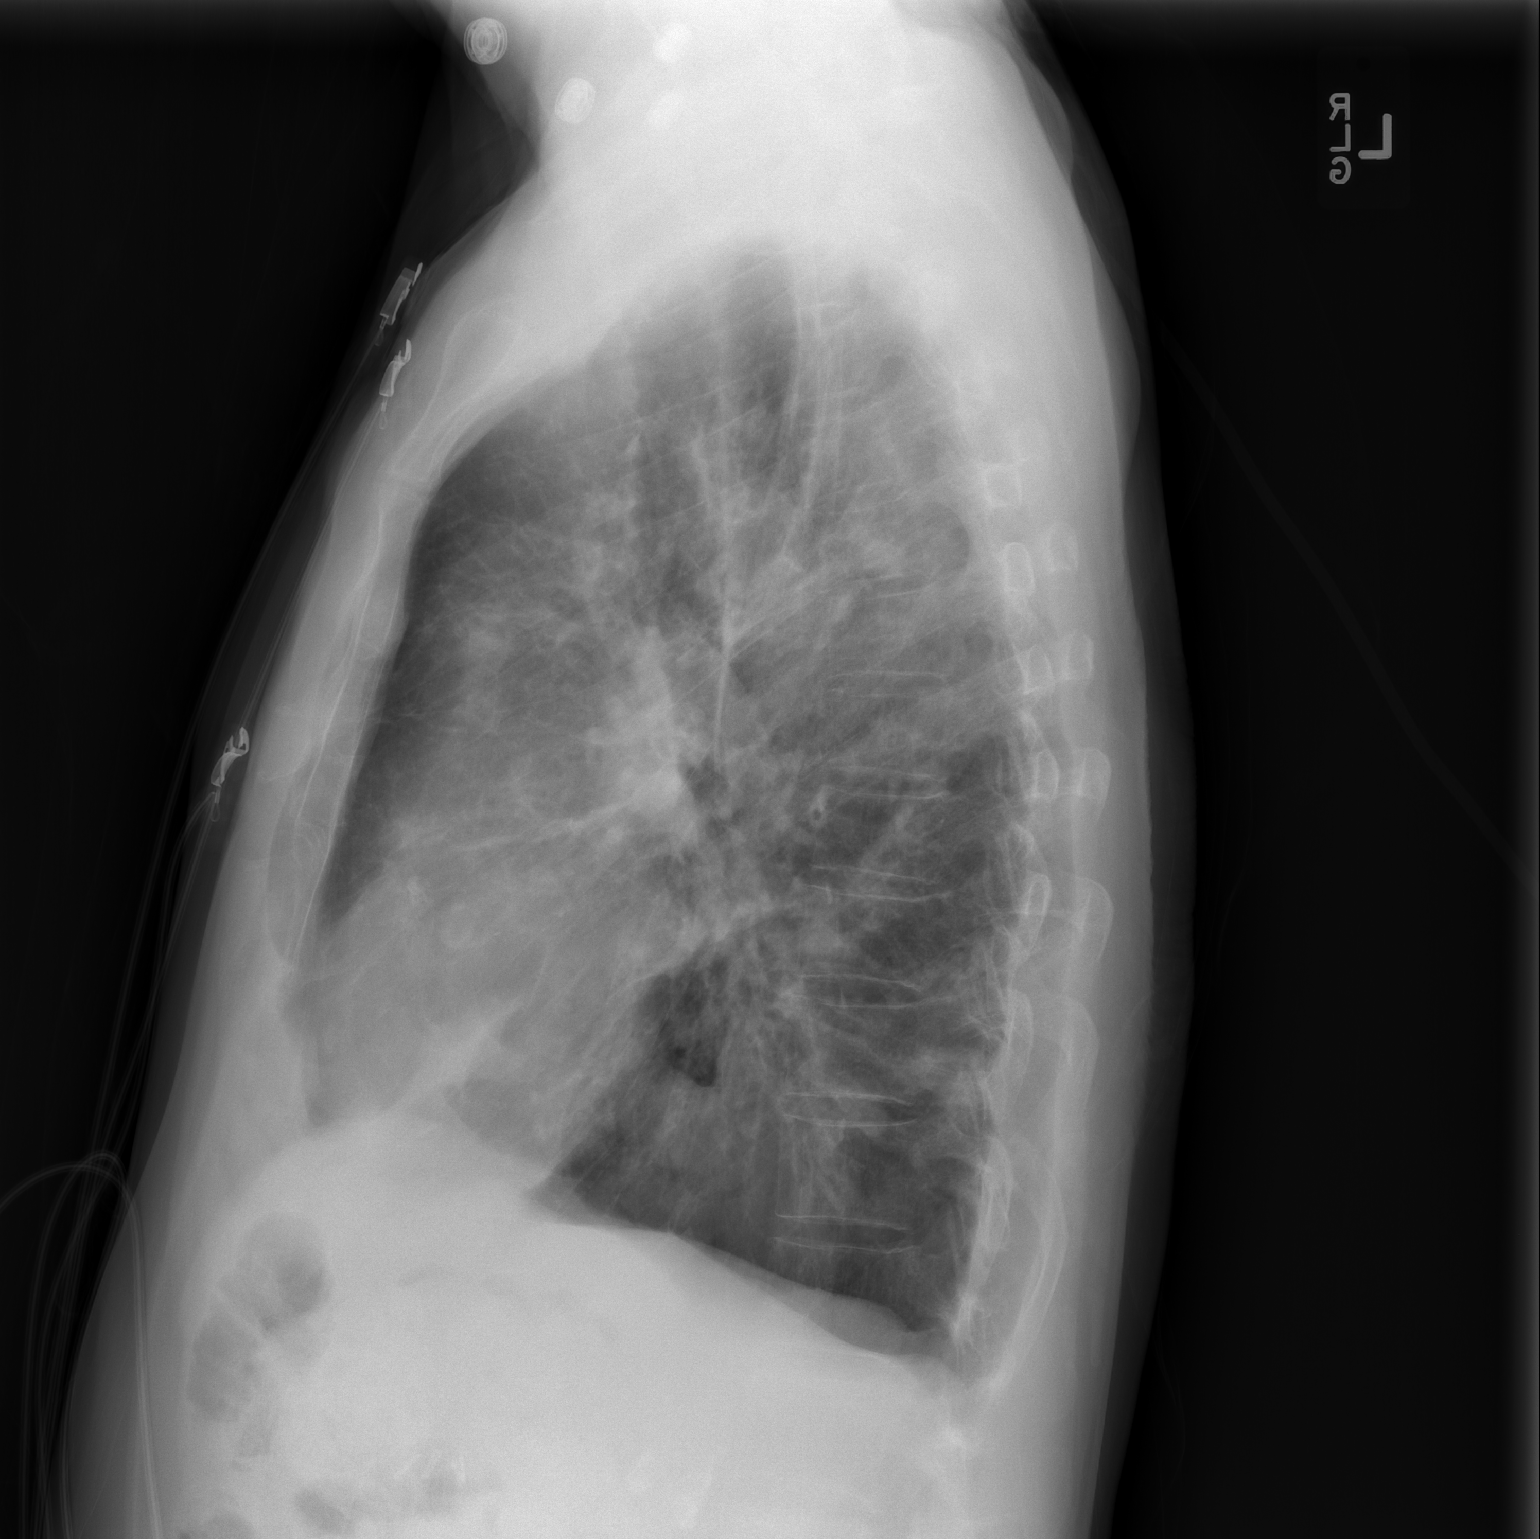

[2 of 2 positions shown; findings below may reference images not displayed]

DG
CHEST 2 VIEW dated 10/04/2013; DG CHEST 2 VIEW dated 09/06/2013; DG
CHEST 2 VIEW dated 08/19/2013
FINDINGS: The lungs are hyperinflated likely secondary to COPD. There are
bilateral cavitary lung nodules, right greater than left better
characterized on recent CT chest dated 10/04/2013. There is
bilateral stable chronic reticular interstitial prominence. There is
no focal consolidation, pleural effusion or pneumothorax. The heart
and mediastinal contours are unremarkable.

The osseous structures are unremarkable.
IMPRESSION: No active cardiopulmonary disease.

Bilateral cavitary lung nodules and interstitial prominence likely
reflecting sequela of Ebadat disease given the patient's history.

## 2014-05-21 ENCOUNTER — Other Ambulatory Visit: Payer: Self-pay | Admitting: Internal Medicine

## 2014-05-23 ENCOUNTER — Ambulatory Visit (INDEPENDENT_AMBULATORY_CARE_PROVIDER_SITE_OTHER)
Admission: RE | Admit: 2014-05-23 | Discharge: 2014-05-23 | Disposition: A | Discharge status: Home / Self Care | Payer: Medicare Other | Source: Ambulatory Visit | Attending: Internal Medicine | Admitting: Internal Medicine

## 2014-05-23 ENCOUNTER — Ambulatory Visit (INDEPENDENT_AMBULATORY_CARE_PROVIDER_SITE_OTHER): Payer: Medicare Other | Admitting: Internal Medicine

## 2014-05-23 ENCOUNTER — Encounter: Payer: Self-pay | Admitting: Internal Medicine

## 2014-05-23 VITALS — BP 124/58 | HR 84 | Temp 98.1°F | Ht 66.0 in | Wt 119.2 lb

## 2014-05-23 DIAGNOSIS — M3130 Wegener's granulomatosis without renal involvement: Secondary | ICD-10-CM

## 2014-05-23 DIAGNOSIS — J479 Bronchiectasis, uncomplicated: Secondary | ICD-10-CM

## 2014-05-23 MED ORDER — PREDNISONE 10 MG PO TABS: 10.0000 mg | ORAL_TABLET | Freq: Every day | ORAL | Status: DC

## 2014-05-23 NOTE — Patient Instructions (Addendum)
No change in prednisone or cytoxan dosing  Have your kidney doctor send me the results of your labs if not in the [facility] ([facility])  Please remember to go to the  x-ray department downstairs for your tests - we will call you with the results when they are available.  Please schedule a follow up visit in 3 months but call sooner if needed

## 2014-05-23 NOTE — Progress Notes (Signed)
Quick Note:  Called, spoke with pt. Informed him of cxr results and recs per Dr. [name]. He verbalized understanding and voiced no further questions or concerns at this time. ______

## 2014-05-23 NOTE — Progress Notes (Signed)
Subjective:    Patient ID: Richard [name], male    DOB: 03-08-1936     MRN: [id number]   Brief patient profile:  21  yowm never smoker with dx of Longstanding bronchiectasis then dx with WG 03/2010 > requested establish with Domenica Weightman.   Admit [facility] dx WG by pos c anca 03/2010, supportive tbbx but not vats   Admit [name] dx spont L Ptx 9/08/15/09 requiring Chest tube but no surgery   05/24/10--NP ov/ med reivew. Since last visit. has been hospitalized 05/12/2010-- 10/10/2011for Right pneumothorax., with underlying Wegener granulomatosis. and . Bronchiectasis. Pt was found to have Right Pneumo. Chest tube was placed. with improvement and removed 10/7. Xray on 10/10 w/ resolved pnuemo. He was continued on cytoxan and bactrim for PCP prophylaxis. rec. Prednisone 20 mg reduce to one half daily  Aspirin 81 mg one daily with breakfast but stop for any bleeding  See Patient Care Coordinator before leaving for cardiology appt > saw [name], ? LHC needed       11/23/2012 f/u ov/Kirstina Leinweber re WG/ bronchiectasis Chief Complaint  Patient presents with  . Follow-up    Had PFT today-sob same,cough-yellow,wheezing occass.,  no hemoptysis, no real limiting sob though sedentary, no arthralgias or rash maintaining 2.5 mg daily No change prednisone 5 mg one half daily  > ok to try every other day if doing great May 1 flare rx levaquin and pred May 16 lap chole hoxworth     08/19/2013 f/u ov/Brenton Joines re: ? Recurrent WG Chief Complaint  Patient presents with  . Follow-up    Breathing is unchanged. Cough unchanged-prod cpugh w/ yellow phlem. On 2nd dose of cipro  now on day 5/10 cipro and less dark, never bloody, no sinus complaints, arthritis rash or fever or hematuria. >ANCA 1:80 titer, started on cytoxan        09/27/2013 f/u ov/Shawnice Tilmon re:  WG on pred 10 and ctx 50 mg daily  Chief Complaint  Patient presents with  . Follow-up    Pt states cough and SOB seems worse since the last visit. Still producing yellow  sputum. He states has low grade temp every am.   assoc with nasal congestion but no epistaxis or hemoptysis  rec Stop Zebeta Schedule sinus CT> 2/09/14/13 Mild to moderate mucosal thickening in the paranasal sinuses without  air-fluid level. For cough use the mucinex dm up to 1200 mg every 12 hours and as much flutter valve as possible For breathing use xopenex up to 2 puffs every 4 hours if needed and don't leave home wihout it   Admit date: 10/04/2013  Discharge date: 10/07/2013  Discharge Diagnoses:  SOB (shortness of breath)  Wegener's granulomatosis  Bronchiectasis with acute exacerbation  SVT (supraventricular tachycardia)  Cough  Weakness generalized  Discharge Condition: Stable and improved  Filed Weights    10/04/13 1539   Weight:  58.423 kg (128 lb 12.8 oz)   History of present illness:  Patient is a pleasant 78 year old white man with a past medical history significant for Wegener's granulomatosis diagnosed in 2011 was on Cytoxan until 2013. He follows with Dr. [name]. He is maintained on prednisone 10 mg daily. He had been feeling well until mid January at which point a chest x-ray showed increased nodules and Dr. [name] decided to restart Cytoxan. Patient came into the hospital with the above-mentioned complaints. His heart rate was noted to be in the 180s in the rhythm of SVT. He converted to sinus rhythm spontaneously. CT scan of  the chest shows bronchiectasis as well as numerous cavitary and non-cavitary nodules. We have been asked to admit him for further evaluation and management. In addition to his cough and mild shortness of breath, he endorses low-grade temperatures for about 2 weeks as well as increasing sinus symptoms.  Hospital Course:  Wegener's Granulomatosis/Bronchiectasis with Acute Exacerbation  -Cx data remains negative to date.  -As discussed with Dr. [name], will transition antibiotics to cipro for 3 more days, continue cytoxan (bactrim while on cytoxan for PCP  prophylaxis), and slow steroid taper until seen again by Dr. [name].  SVT  -Spontaneously converted to NSR.          12/06/13 to ER with dehyrdration   01/09/2014 f/u ov/Clay Solum re:  Chief Complaint  Patient presents with  . Follow-up    Pt c/o non prod cough and states that his breathing is no better. He c/o poor appetite.   rec If mucus gets nasty,  Levaquin 750 x 7 days If mucus gets thick >  mucinex dm 1200 mg every 12 h and use the flutter valve   02/12/2014 f/u ov/Nan Maya re: bronchiectasis / pred 10 / cyt 2 Chief Complaint  Patient presents with  . Follow-up    Pt states that his symptoms are unchanged since last visit. Finished round of levaquin x 2 days ago.  No new co's today.   ok at [employer] leaning on the cart  New occ chest discomfort when sob x sev months, goes away at rest. No assoc diaph, nausea, radiation Has never tried xopenex for this symptom to date, encouraged to do so rec Please see patient coordinator before you leave today  to schedule VEST Tdap today      Patient ID: Richard [name]  MRN: [id number], DOB/AGE: 05-Nov-1935 78 y.o.  Admit date: 03/13/2014  D/C date: 03/26/2014   Primary Cardiologist: Dr. Debara Pickett  Principal Problem:  Unstable Class III Angina  Active Problems:  HYPOTHYROIDISM  HYPERLIPIDEMIA  ANEMIA  Wegener's granulomatosis  Chronic respiratory failure  Abnormal nuclear stress test: Intermediate risk with moderate region of apical and inferior scar with mild superimposed ischemia; EF 40%  CAD- severe 4 V CAD- not CABG candidate  Anemia- transfused 03/16/14  Chronic renal insufficiency, stage III (moderate)  Cardiomyopathy, ischemic- EF 40% by Myoview  Acute blood loss anemia  Femoral artery pseudo-aneurysm, right  Admission Dates: 03/13/14 - 03/26/14  Discharge Diagnosis: [place] s/p [facility] x3 and ultimately BMS to pLAD& POBA to CTO of circumflex/marginal vessel on 03/17/14 and staged BMS to mRCA on 03/21/14  HPI: Richard [name] is a 78 y.o. male  with a history of hypothyroidism, HLD, anemia, Wegener's granulomatosis and no prior cardiac disease who presented to [facility] ED on 03/13/14 with chest pain and was transferred to [facility] for cath the following morning.  PMH significant for Wegener's granulomatosis and follows with Dr. [name] for his pulmonary disease (severe multifocal bronchiectasis as well as numerous cavitary and non-cavitary nodules). He had been having chest tightness with significant exertion for the last few months with increasing dyspnea on exertion. Also had 2 brothers who both had CABG. Referred to Dr. Debara Pickett who proceeded with nuclear stress testing which demonstrated a "moderate region of apical and inferior scar with mild superimposed qualitative ischemia". LV wall motion demonstrated a "moderate degree of hypokinesis involving the mid-distal inferior wall and apex with an EF of 40%". Prior echo dated 03/30/2010 showed EF 45-50%.  Hospital Course: He presented to [facility] ED with increasing fatigue  and chest tightness which developed that morning, which resolved within 10 minutes.  He was given ASA, heparin, and nitro paste. ECG demonstrated sinus tachycardia with no acute ST-T abnormalities. POC troponin normal. Also noted to be anemic, Hgb 9 (10 on 12/06/13). Dr. [name] noted that Mr. Mayhall has had hemoptysis in the past, therefore limiting DAPT was ideal and BMS over DES thought to be the best option.  [place]/CAD:  -- Abnormal outpatient nuclear stress test on 03/12/14: Intermediate risk with moderate region of apical and inferior scar with mild superimposed ischemia; EF 40%  -- s/p first LHC on 03/14/14 which revealed severe multivessel CAD involving all 4 major branches. Seen by Dr. Madolyn Frieze on this admission who deemed him unsuitable for CABG due to underlying comorbid conditions. ( sts score >30). It was elected to proceed with staged multivessel PCI .  -- s/p LHC on 03/17/14 with successful PCI to the LAD and POBA to a chronically occluded LCX. There  remained high grade proximal disease to a large proximal Ramus branch as well as mid-RCA disease so staged PCI was planned at a later date due to pulmonary issues and renal dysfunction. This was further complicated by right femoral artery pseudoaneurysm requiring repair. LHC on 03/21/14 s/p PCI with rotablator atherectomy with BMS to the mid RCA. Due to length of fluoro time and large volume of contrast, it was decided to medically manage the ramus stenosis which Dr. Angelena Form felt was moderately stenosed and if he has recurrent anginal then consider PCI.  -- Continue ASA/BB/Plavix/statin  Right femoral pseudoaneurysm: s/p suture repair of femoral artery with evacuation of hematoma on 03/19/14. Still with significant hematoma down buttox and hamstring but improving. CBC stable at 7.7.  -- Has follow up with Dr. Kellie Simmering on 04/08/2014  Anemia: subsequent to right femoral pseudoaneurysm- s/p surgical repair. S/p blood transfusion x 2. Hgb improved from 7.4 to 8.6. Stable at 7.7 on discharge.  --Will send home on Iron 324 mg daily with colace 100 mg BID to help prevent constipation  SOB - BNP elevated at 4937. D-Dimer elevated at 2.6. CTA with no acute PE. No s/s of volume overload  -- Though to be possibly due to anemia. Chest xray showed multiple cavitary lesions but no CHF. CTA with multiple abnormalities possibly reflecting acute bronchitis vs organizing PNA. PCCM (followed by Dr. [name]) consulted who felt that the CT was at his baseline and to continue current therapy. He thought his SOB was likely multifactorial and anemia also playing a role.  -- BNP elevated at 4937. D-Dimer elevated at 2.6. CTA with no acute PE. No s/s of volume overload  Chronic systolic CHF- 2 D ECHO with LVEF 40-45%, mid to distal inferoapical and apical hypokinesis, diastolic dysfunction with elevated LV filling pressure, mild to moderate RV hypokinesis.  Wegener's granulomatosis. ( per PCCM)  -- Continue cytoxan, prednisone  --  Bactrim for PCP prophylaxis  -- Will need pulmonary rehab as an outpatient  Chronic renal insufficiency, stage III (moderate).  -- Creat elevated but stable after contrast exposure for CTA  -- Creat stable at 1.48 today. Continue to monitor  The patient has had a prolonged hospital course but is recovering well. The femoral catheter site is stable. He has been seen by Dr. Irish Lack today and deemed ready for discharge home. All follow-up appointments have been scheduled. Discharge medications are listed below.     04/11/2014 ext post hosp  f/u ov/Nattie Lazenby re: bronchiectasis / angina resolved p stent Chief Complaint  Patient presents with  . Follow-up    couging yellowish   Not using vest yet due to concerns with stent and plans on talking to cards first. cipro best recent rx for purulent sputum but doesn't have a prn on hand  maint on pred 10 mg per day and cytoxan 50  rec Cipro 500 mg twice daily x 10 days as needed for nasty mucus    05/23/2014 f/u ov/Zen Cedillos re: WG on cytoxan 50/ pred 10 with CRI f/u by nephrology  Chief Complaint  Patient presents with  . Follow-up    Wegener's granulomatosis; SOB, cough, spitting up yellow sputum, finished Levaquin 1 week ago  feels levaquin may work a little better than cipro No need for saba Not limited by breathing from desired activities  As by gen weakness  No hemoptysis     No obvious day to day or daytime variabilty or cp or chest tightness, subjective wheeze overt   hb symptoms. No unusual exp hx or h/o childhood pna/ asthma or knowledge of premature birth.  Sleeping ok without nocturnal  or early am exacerbation  of respiratory  c/o's or need for noct saba. Also denies any obvious fluctuation of symptoms with weather or environmental changes or other aggravating or alleviating factors except as outlined above   Current Medications, Allergies, Complete Past Medical History, Past Surgical History, Family History, and Social History were reviewed  in [employer] record.  ROS  The following are not active complaints unless bolded sore throat, dysphagia, dental problems, itching, sneezing,  nasal congestion or excess/ purulent secretions, ear ache,   fever, chills, sweats, unintended wt loss, pleuritic or exertional cp, hemoptysis,  orthopnea pnd or leg swelling, presyncope, palpitations, heartburn, abdominal pain, anorexia, nausea, vomiting, diarrhea  or change in bowel or urinary habits, change in stools or urine, dysuria,hematuria,  rash, arthralgias, visual complaints, headache, numbness weakness or ataxia or problems with walking or coordination,  change in mood/affect or memory.               Objective:   Physical Exam  wt   128  04/26/11 >07/08/2011  134> 134 11/28/2011 > 02/29/2012 136> 05/31/2012  136>  134 11/22/12 > 07/26/2013  136 > 08/21/2013  136 >133 09/06/2013 >  09/12/2013  133 > 132 09/27/2013 > 10/15/2013 131 > 11/07/2013  128 > 01/09/14 124 > 02/12/2014  123 > 04/11/2014   117 > 05/23/2014 119   amb thin pleasant wm nad  SKIN: no rash, lesions  NODES: no lymphadenopathy  HEENT: [place]/AT, EOM- WNL, Conjuctivae- clear, PERRLA, TM-WNL, Nose- clear, Throat- clear and wnl, Mallampati III  NECK: Supple w/ fair ROM, JVD- none, normal carotid impulses w/o bruits Thyroid-  CHEST: min bilateral exp rhonchi  No resp distress HEART: RRR, no m/g/r heard  ABDOMEN: Soft and nl EXT:  No deformities or restrucitons     CXR  05/23/2014 :  Stable exam. No change in bilateral interstitial opacities, bronchiectasis and cavitation secondary to Wegener's granulomatosis.     Assessment & Plan:

## 2014-05-24 NOTE — Assessment & Plan Note (Signed)
-    HFA 90% p coaching 12/16/2010   -   PFT's 06/21/11   FEV1  1.60 (61%) ratio 48 and 10% better p B2,  DLCO 83%  - CT 10/04/13 severe multifocal bronchiectasis as well as numerous cavitary and      non-cavitary nodules  -   PFT's 11/22/2012 FEV1  1.52 (62%) arnd 46 and no change p B2 DLCO 89%  -    VEST rec 02/12/2014 > reports improvement 05/23/14 > rec continue at least twice daily

## 2014-05-24 NOTE — Assessment & Plan Note (Signed)
-  Dx by TBBX/ pos anca 03/2010 > CTX started    - Off all prednisone with baseline esr 31 11/16/2010  > restarted 12/16/2010 with nausea and wt down to 115 but ok sats walking x 3 laps   - Recheck ANCA sent  12/16/2010  >  neg   -Trial off cytoxan starting 02/12/2011 >   03/16/11 Cytoxan stopped  - Prednisone ceiling is 10 mg per day and floor is 5 mg daily as of 04/26/13  - 08/19/13 ANCA POS 1:80 with increasing nodular dz > rx 08/21/2013 cytoxan 50 mg daily  - Started bactrim ds one daily 10/07/13 - ESR up 11/07/13 rec double pred to 20 mg until feeling better then floor of 10 mg > esr 48  02/12/14 on 10 mg pred daily   Adequate control on present rx, reviewed > no change in rx needed    Each maintenance medication was reviewed in detail including most importantly the difference between maintenance and as needed and under what circumstances the prns are to be used. This was done in the context of a medication calendar review which provided the patient with a user-friendly unambiguous mechanism for medication administration and reconciliation and provides an action plan for all active problems. It is critical that this be shown to every doctor  for modification during the office visit if necessary so the patient can use it as a working document.     

## 2014-05-26 ENCOUNTER — Telehealth: Payer: Self-pay | Admitting: Internal Medicine

## 2014-05-26 MED ORDER — CLOPIDOGREL BISULFATE 75 MG PO TABS: 75.0000 mg | ORAL_TABLET | Freq: Every day | ORAL | Status: DC

## 2014-05-26 NOTE — Telephone Encounter (Signed)
Pt wants to know if he need to continue taking Clopidogrel? If he does,he will need a new prescription.Please call to [phone].

## 2014-05-26 NOTE — Telephone Encounter (Signed)
Rx was sent to pharmacy electronically. LM for patient that medication was refilled

## 2014-05-28 ENCOUNTER — Other Ambulatory Visit: Payer: Self-pay | Admitting: Family Medicine

## 2014-05-28 ENCOUNTER — Other Ambulatory Visit: Payer: Self-pay | Admitting: Nephrology

## 2014-05-28 DIAGNOSIS — N183 Chronic kidney disease, stage 3 (moderate): Secondary | ICD-10-CM

## 2014-05-28 DIAGNOSIS — R319 Hematuria, unspecified: Secondary | ICD-10-CM

## 2014-05-29 ENCOUNTER — Telehealth: Payer: Self-pay | Admitting: Family Medicine

## 2014-05-29 ENCOUNTER — Other Ambulatory Visit: Payer: Medicare Other

## 2014-05-29 NOTE — Telephone Encounter (Signed)
Pt request refill dutasteride (AVODART) 0.5 MG capsule [facility] [place] [address] and [employer]

## 2014-05-30 ENCOUNTER — Telehealth: Payer: Self-pay | Admitting: Internal Medicine

## 2014-05-30 ENCOUNTER — Ambulatory Visit
Admission: RE | Admit: 2014-05-30 | Discharge: 2014-05-30 | Disposition: A | Discharge status: Home / Self Care | Payer: Medicare Other | Source: Ambulatory Visit | Attending: Nephrology | Admitting: Nephrology

## 2014-05-30 DIAGNOSIS — R319 Hematuria, unspecified: Secondary | ICD-10-CM

## 2014-05-30 DIAGNOSIS — N183 Chronic kidney disease, stage 3 (moderate): Secondary | ICD-10-CM

## 2014-05-30 MED ORDER — DUTASTERIDE 0.5 MG PO CAPS: ORAL_CAPSULE | ORAL | Status: DC

## 2014-05-30 NOTE — Telephone Encounter (Signed)
rx sent in electronically 

## 2014-05-30 NOTE — Telephone Encounter (Signed)
Received 8 pages records from [place] for appointment with Dr Debara Pickett 07/17/14  Records given to [facility] (medical records) for  Dr Debara Pickett schedule on 07/17/14. lp

## 2014-06-03 ENCOUNTER — Encounter (HOSPITAL_COMMUNITY)
Admission: RE | Admit: 2014-06-03 | Discharge: 2014-06-03 | Disposition: A | Discharge status: Home / Self Care | Payer: Medicare Other | Source: Ambulatory Visit | Attending: Nephrology | Admitting: Nephrology

## 2014-06-03 DIAGNOSIS — N184 Chronic kidney disease, stage 4 (severe): Secondary | ICD-10-CM | POA: Diagnosis not present

## 2014-06-03 DIAGNOSIS — D631 Anemia in chronic kidney disease: Secondary | ICD-10-CM | POA: Insufficient documentation

## 2014-06-03 LAB — POCT HEMOGLOBIN-HEMACUE: HEMOGLOBIN: 6.5 g/dL — AB (ref 13.0–17.0)

## 2014-06-03 MED ORDER — EPOETIN ALFA 40000 UNIT/ML IJ SOLN: 30000.0000 [IU] | INTRAMUSCULAR | Status: DC

## 2014-06-03 MED ORDER — EPOETIN ALFA 20000 UNIT/ML IJ SOLN
INTRAMUSCULAR | Status: AC
  Administered 2014-06-03: 20000 [IU] via SUBCUTANEOUS
  Filled 2014-06-03: qty 1

## 2014-06-03 MED ORDER — EPOETIN ALFA 10000 UNIT/ML IJ SOLN
INTRAMUSCULAR | Status: AC
  Administered 2014-06-03: 10000 [IU] via SUBCUTANEOUS
  Filled 2014-06-03: qty 1

## 2014-06-03 NOTE — Discharge Instructions (Signed)

## 2014-06-10 ENCOUNTER — Encounter (HOSPITAL_COMMUNITY)
Admission: RE | Admit: 2014-06-10 | Discharge: 2014-06-10 | Disposition: A | Discharge status: Home / Self Care | Payer: Medicare Other | Source: Ambulatory Visit | Attending: Nephrology | Admitting: Nephrology

## 2014-06-10 DIAGNOSIS — D631 Anemia in chronic kidney disease: Secondary | ICD-10-CM | POA: Diagnosis not present

## 2014-06-10 DIAGNOSIS — N184 Chronic kidney disease, stage 4 (severe): Secondary | ICD-10-CM | POA: Diagnosis not present

## 2014-06-10 LAB — POCT HEMOGLOBIN-HEMACUE: Hemoglobin: 7.5 g/dL — ABNORMAL LOW (ref 13.0–17.0)

## 2014-06-10 MED ORDER — EPOETIN ALFA 20000 UNIT/ML IJ SOLN
INTRAMUSCULAR | Status: AC
  Administered 2014-06-10: 20000 [IU] via SUBCUTANEOUS
  Filled 2014-06-10: qty 1

## 2014-06-10 MED ORDER — EPOETIN ALFA 40000 UNIT/ML IJ SOLN: 30000.0000 [IU] | INTRAMUSCULAR | Status: DC

## 2014-06-10 MED ORDER — EPOETIN ALFA 10000 UNIT/ML IJ SOLN
INTRAMUSCULAR | Status: AC
  Administered 2014-06-10: 10000 [IU] via SUBCUTANEOUS
  Filled 2014-06-10: qty 1

## 2014-06-12 ENCOUNTER — Encounter (HOSPITAL_COMMUNITY): Payer: Medicare Other

## 2014-06-17 ENCOUNTER — Encounter (HOSPITAL_COMMUNITY): Payer: Medicare Other

## 2014-06-17 ENCOUNTER — Encounter (HOSPITAL_COMMUNITY)
Admission: RE | Admit: 2014-06-17 | Discharge: 2014-06-17 | Disposition: A | Discharge status: Home / Self Care | Payer: Medicare Other | Source: Ambulatory Visit | Attending: Nephrology | Admitting: Nephrology

## 2014-06-17 DIAGNOSIS — D631 Anemia in chronic kidney disease: Secondary | ICD-10-CM | POA: Diagnosis not present

## 2014-06-17 LAB — POCT HEMOGLOBIN-HEMACUE: Hemoglobin: 7.9 g/dL — ABNORMAL LOW (ref 13.0–17.0)

## 2014-06-17 MED ORDER — EPOETIN ALFA 40000 UNIT/ML IJ SOLN: 30000.0000 [IU] | INTRAMUSCULAR | Status: DC

## 2014-06-17 MED ORDER — EPOETIN ALFA 10000 UNIT/ML IJ SOLN
INTRAMUSCULAR | Status: AC
  Administered 2014-06-17: 10000 [IU] via SUBCUTANEOUS
  Filled 2014-06-17: qty 1

## 2014-06-17 MED ORDER — EPOETIN ALFA 20000 UNIT/ML IJ SOLN
INTRAMUSCULAR | Status: AC
  Administered 2014-06-17: 20000 [IU] via SUBCUTANEOUS
  Filled 2014-06-17: qty 1

## 2014-06-19 IMAGING — CR DG CHEST 2V
2 series · 2 of 2 positions shown · non-contrast
Comparison: DG CHEST 2 VIEW dated 10/07/2013; CT ANGIO CHEST W/CM
&/OR WO/CM dated 10/04/2013

CLINICAL DATA: Cough.  Mariglen Gleni.

EXAM:
CHEST  2 VIEW

[view not recorded (1 of 2)]
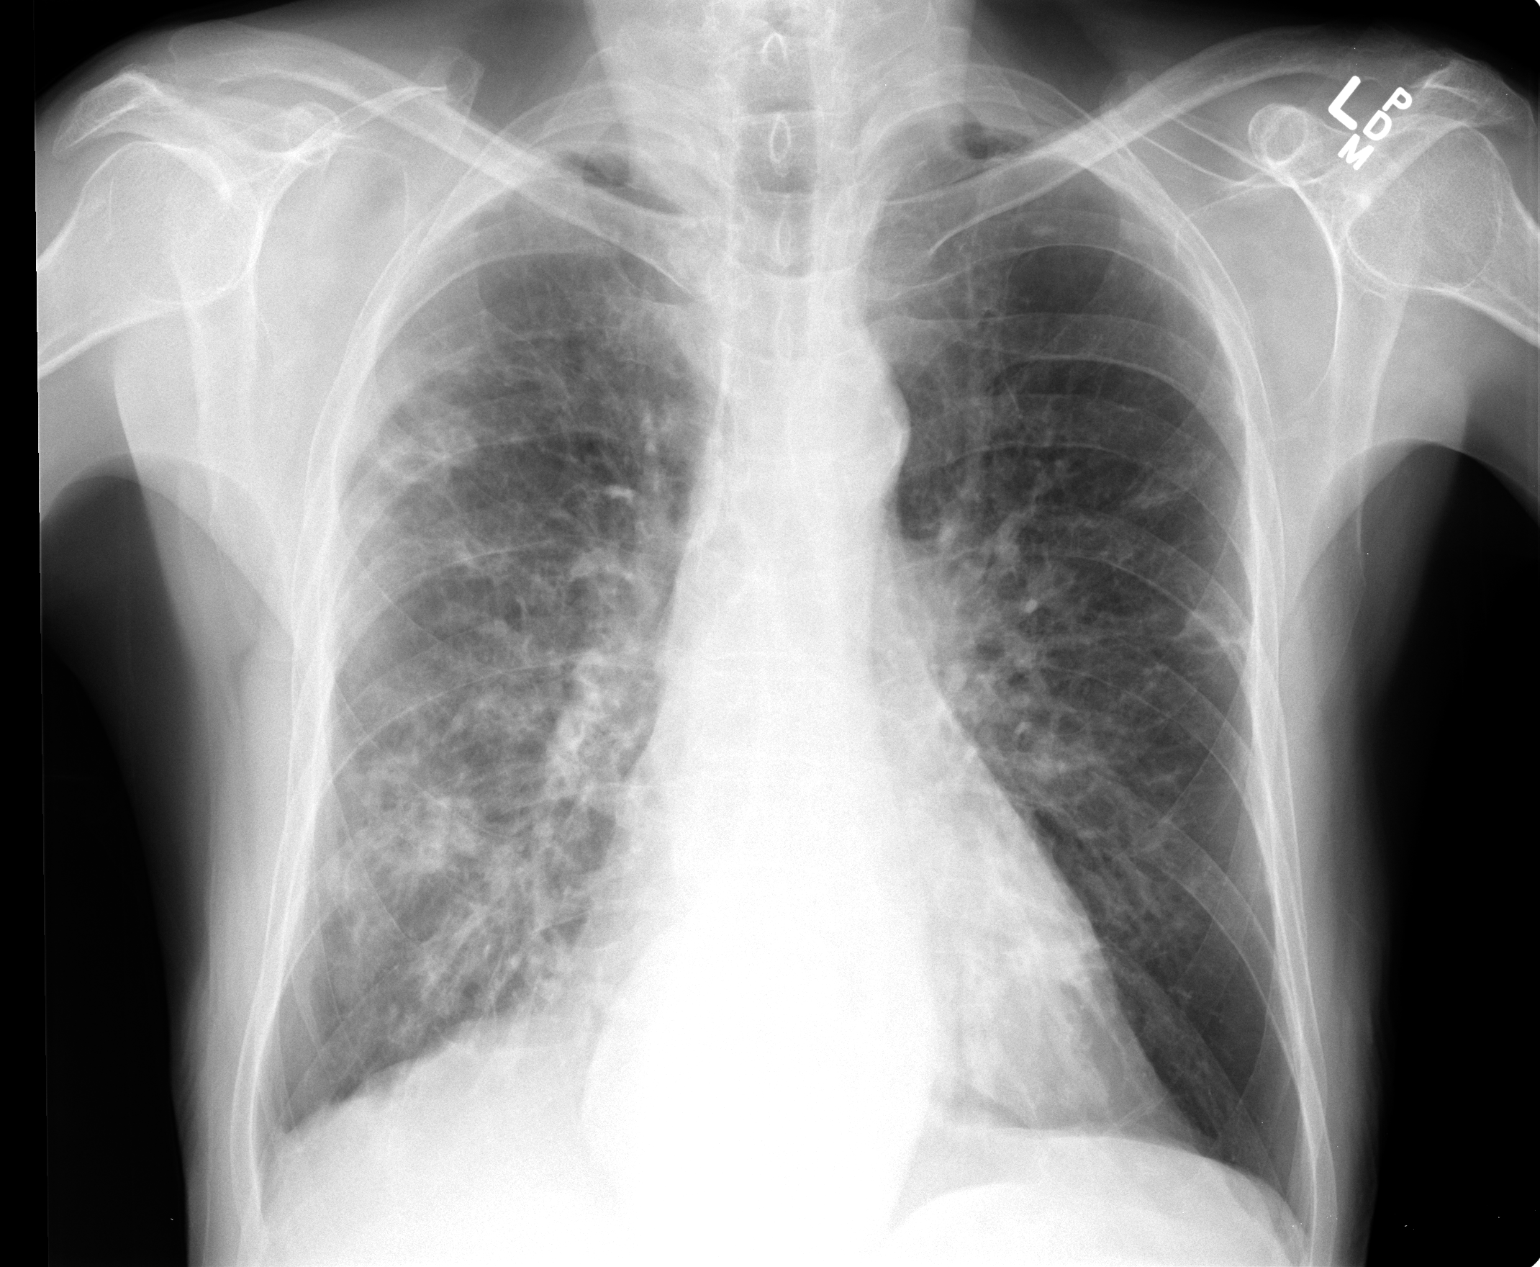

[view not recorded (2 of 2)]
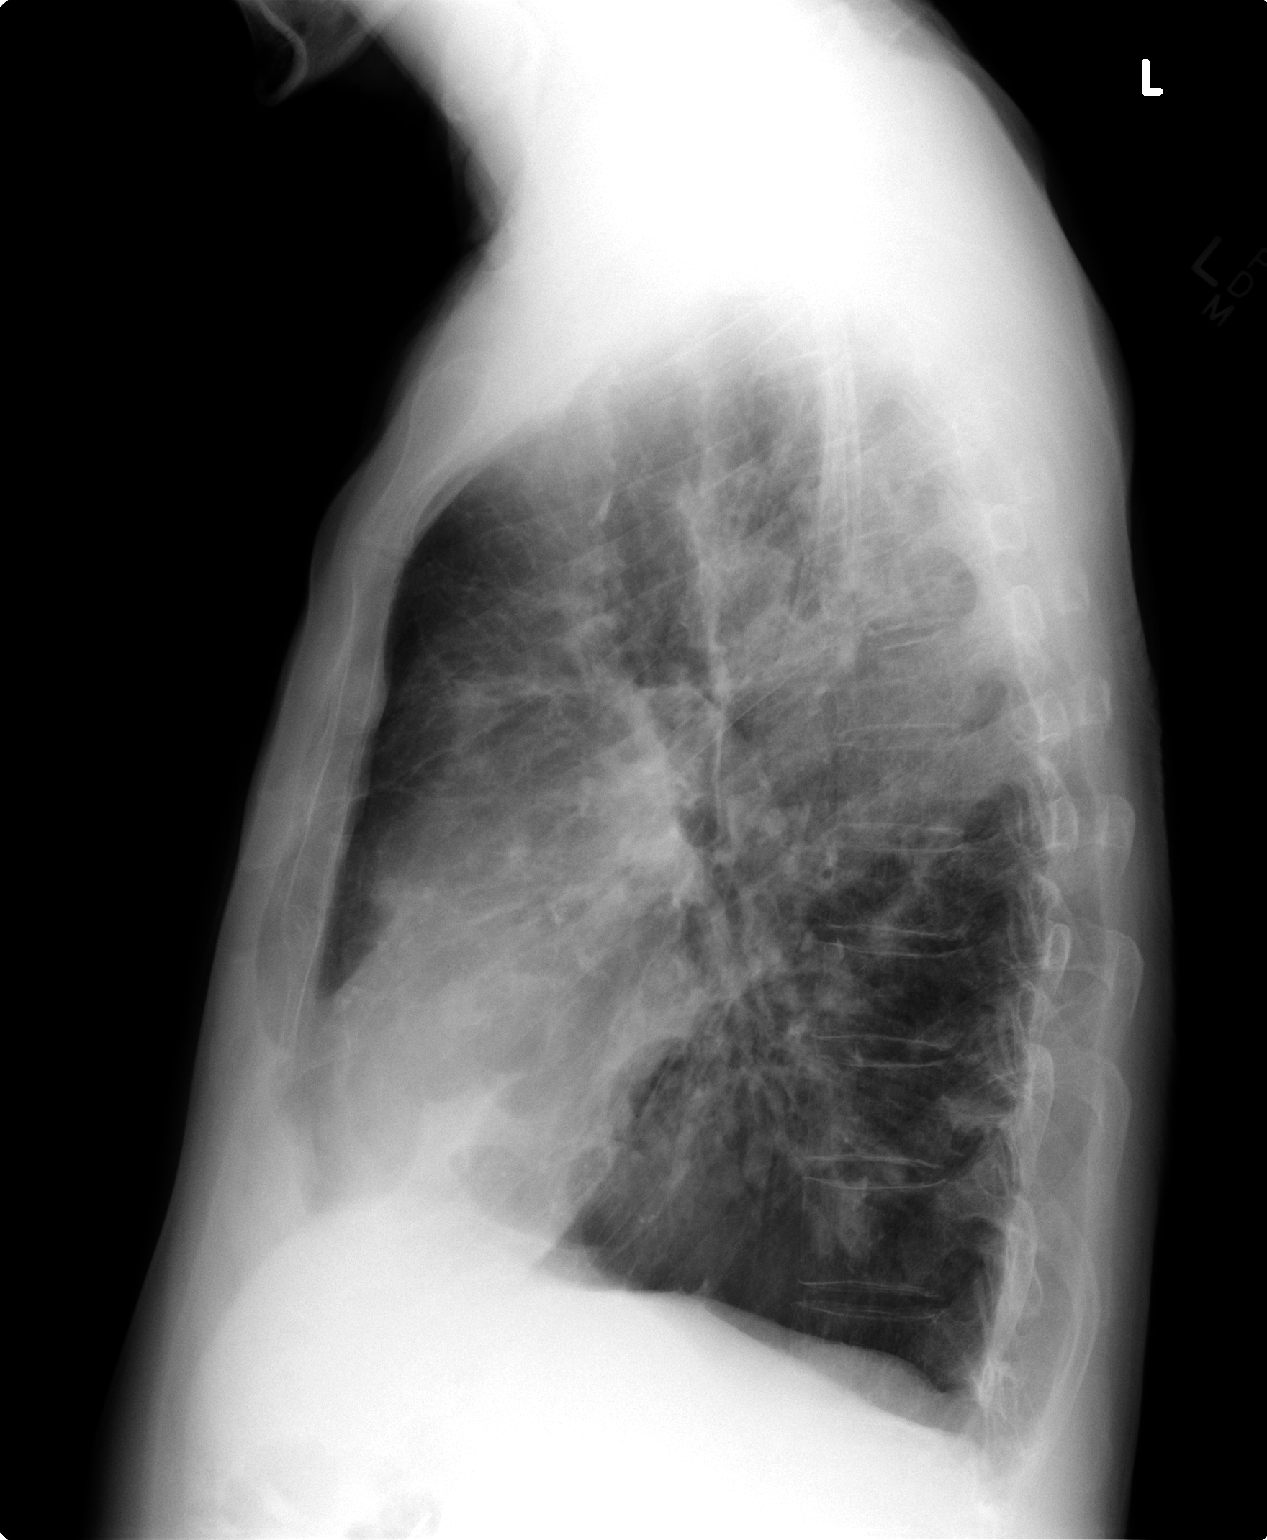

[2 of 2 positions shown; findings below may reference images not displayed]

FINDINGS: Mediastinum and hilar structures stable. Stable cardiomegaly. Normal
pulmonary vascularity. Bilateral cavitary pulmonary nodules are
again noted. Given patient's history of Wegener's granulomatosis
these pulmonary nodules are most likely secondary to Mariglen Gleni.
Other etiologies of cavitary nodules including infectious or
malignant etiologies cannot be excluded. No pleural effusion or
pneumothorax. Sliding hiatal hernia. No acute bony abnormalities.
IMPRESSION: Persistent unchanged bilateral cavitary pulmonary nodules consistent
with the patient's known diagnosis of Wegener's granulomatosis.

## 2014-06-24 ENCOUNTER — Encounter (HOSPITAL_COMMUNITY)
Admission: RE | Admit: 2014-06-24 | Discharge: 2014-06-24 | Disposition: A | Discharge status: Home / Self Care | Payer: Medicare Other | Source: Ambulatory Visit | Attending: Nephrology | Admitting: Nephrology

## 2014-06-24 DIAGNOSIS — D631 Anemia in chronic kidney disease: Secondary | ICD-10-CM | POA: Diagnosis not present

## 2014-06-24 LAB — RENAL FUNCTION PANEL
ANION GAP: 13 (ref 5–15)
Albumin: 2.9 g/dL — ABNORMAL LOW (ref 3.5–5.2)
BUN: 27 mg/dL — AB (ref 6–23)
CHLORIDE: 105 meq/L (ref 96–112)
CO2: 23 mEq/L (ref 19–32)
Calcium: 8.2 mg/dL — ABNORMAL LOW (ref 8.4–10.5)
Creatinine, Ser: 2.16 mg/dL — ABNORMAL HIGH (ref 0.50–1.35)
GFR calc Af Amer: 32 mL/min — ABNORMAL LOW (ref >90)
GFR calc non Af Amer: 28 mL/min — ABNORMAL LOW (ref >90)
GLUCOSE: 147 mg/dL — AB (ref 70–99)
POTASSIUM: 4.4 meq/L (ref 3.7–5.3)
Phosphorus: 3.2 mg/dL (ref 2.3–4.6)
Sodium: 141 mEq/L (ref 137–147)

## 2014-06-24 LAB — CBC
HCT: 28.3 % — ABNORMAL LOW (ref 39.0–52.0)
Hemoglobin: 8.9 g/dL — ABNORMAL LOW (ref 13.0–17.0)
MCH: 32.6 pg (ref 26.0–34.0)
MCHC: 31.4 g/dL (ref 30.0–36.0)
MCV: 103.7 fL — AB (ref 78.0–100.0)
Platelets: 266 10*3/uL (ref 150–400)
RBC: 2.73 MIL/uL — AB (ref 4.22–5.81)
RDW: 17 % — AB (ref 11.5–15.5)
WBC: 7 10*3/uL (ref 4.0–10.5)

## 2014-06-24 IMAGING — CT CT MAXILLOFACIAL W/O CM
3 of 4 series · 16 of 47 positions shown, 19 images · non-contrast
Comparison: Limited sinus CT 10/04/2013. CT the head and face
04/11/2010.

CLINICAL DATA: Left frontal sinus pain. I pain. Wegener's
granulomatosis.

EXAM:
CT MAXILLOFACIAL WITHOUT CONTRAST
TECHNIQUE: Multidetector CT imaging of the maxillofacial structures was
performed. Multiplanar CT image reconstructions were also generated.
A small metallic BB was placed on the right temple in order to
reliably differentiate right from left.

[Series 3: orbit 2.0 h30s · axial · 0.34mm/px · z∈[+1050,+1144]mm · 10 of 55 slices shown, 13 images]
[im 4/55  brain]
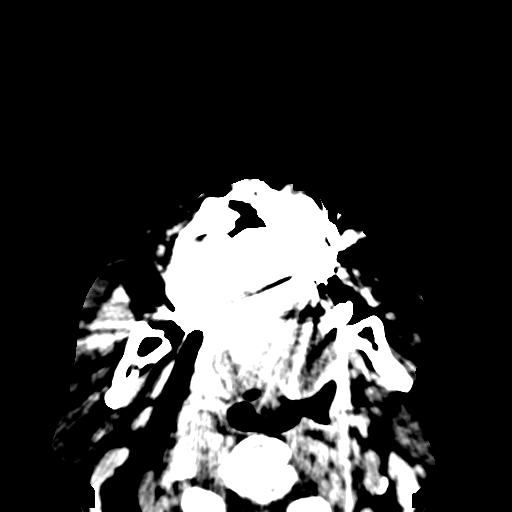
[im 4/55  bone]
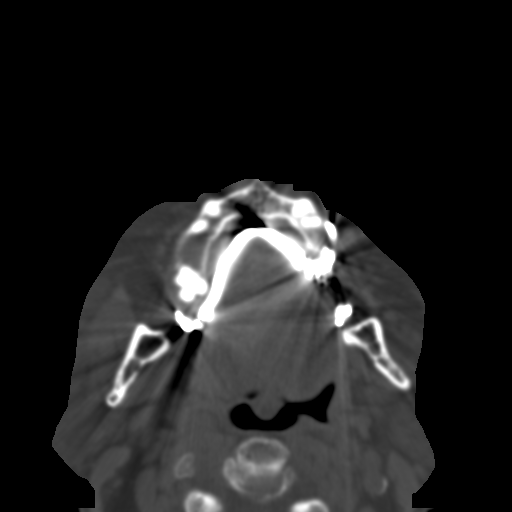
[im 10/55  bone]
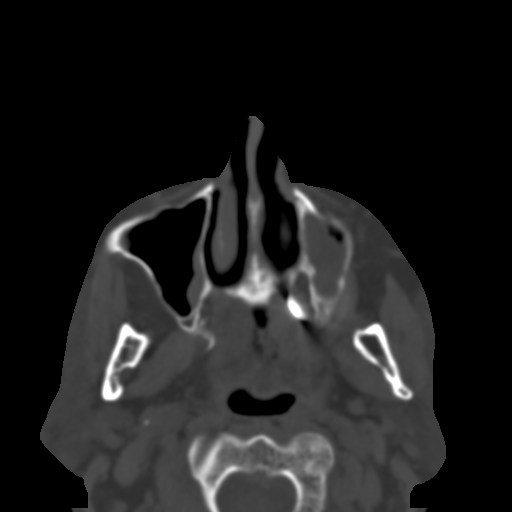
[im 15/55  bone]
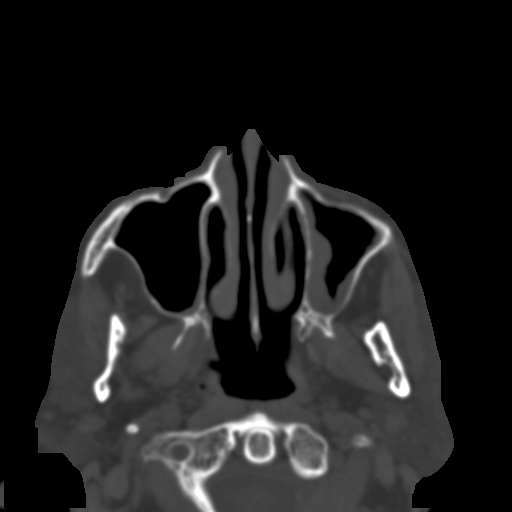
[im 19/55  bone]
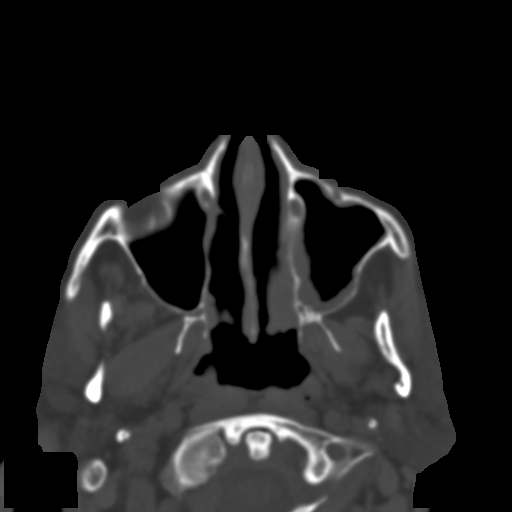
[im 25/55  brain]
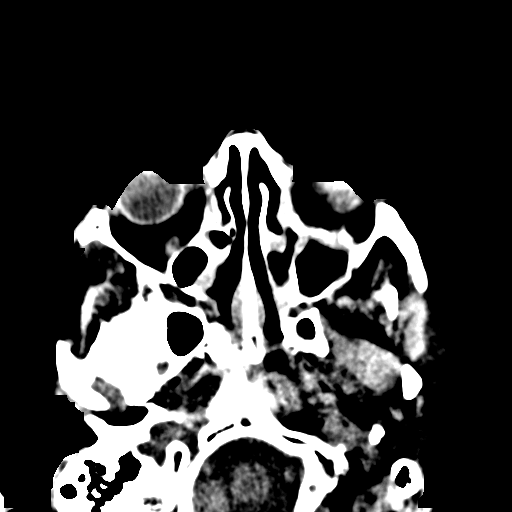
[im 25/55  bone]
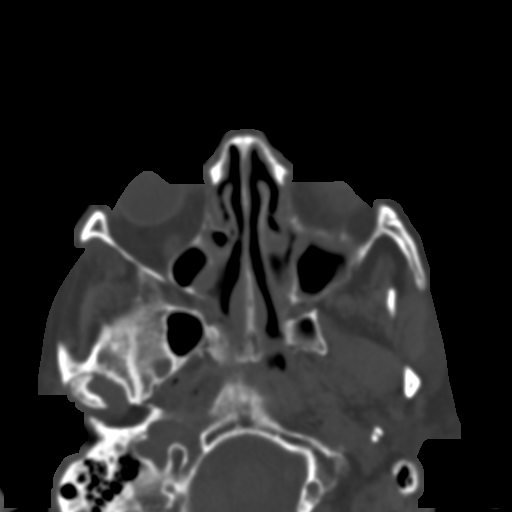
[im 30/55  bone]
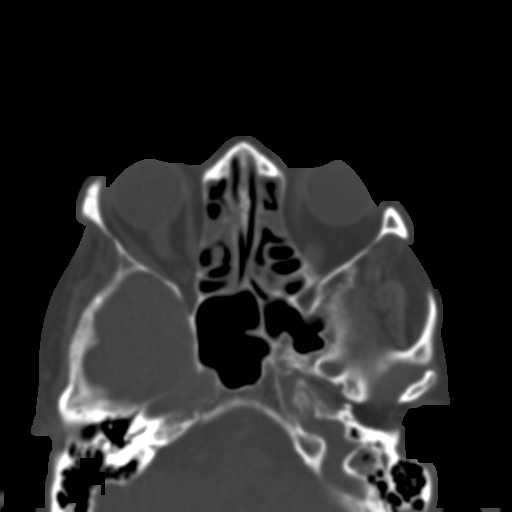
[im 36/55  bone]
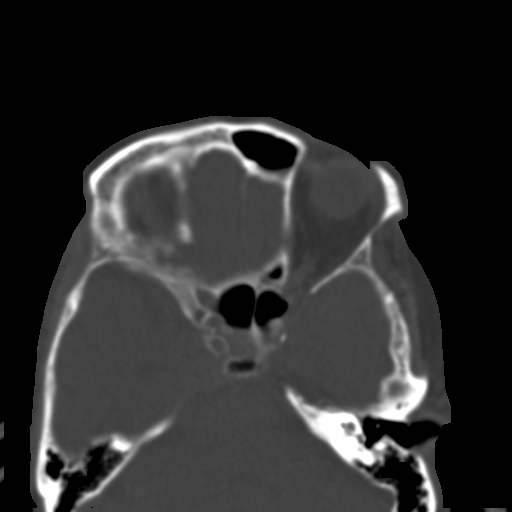
[im 41/55  bone]
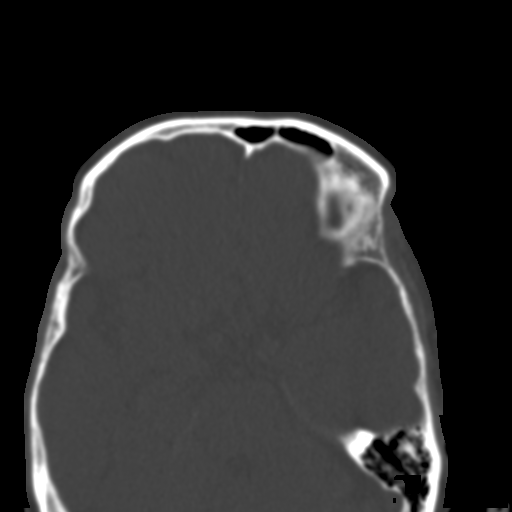
[im 45/55  brain]
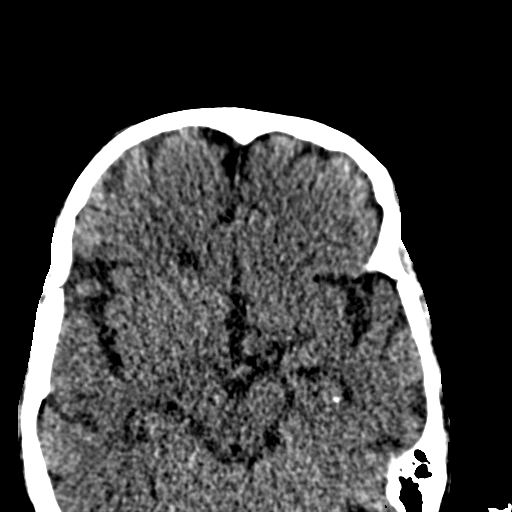
[im 45/55  bone]
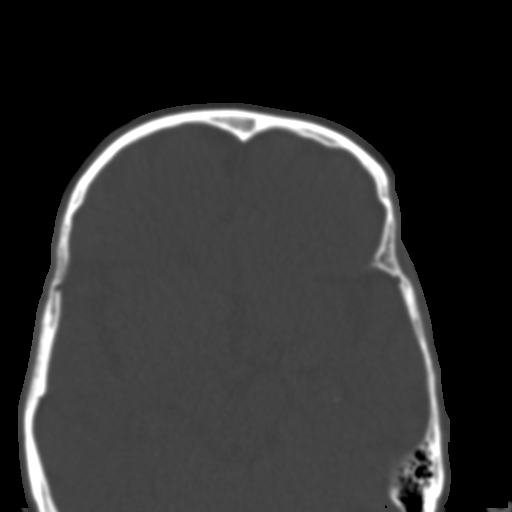
[im 51/55  bone]
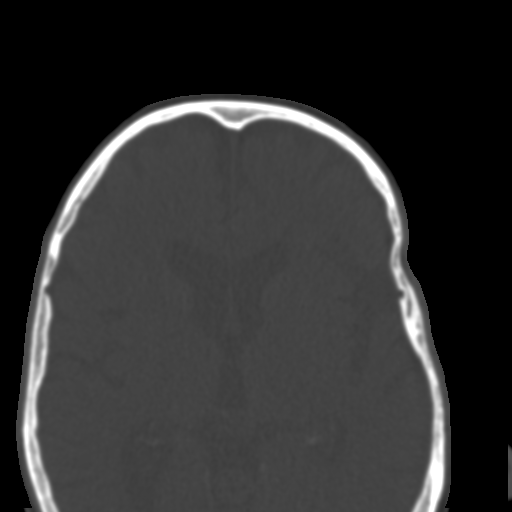

[Series 602: cor · coronal · 0.34mm/px · 3 of 58 slices shown]
[im 15/58  bone]
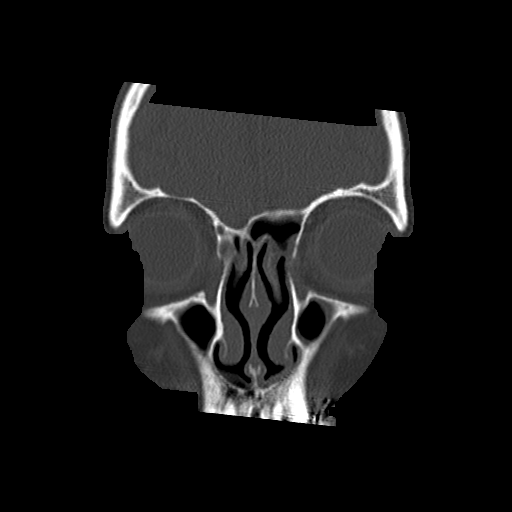
[im 29/58  bone]
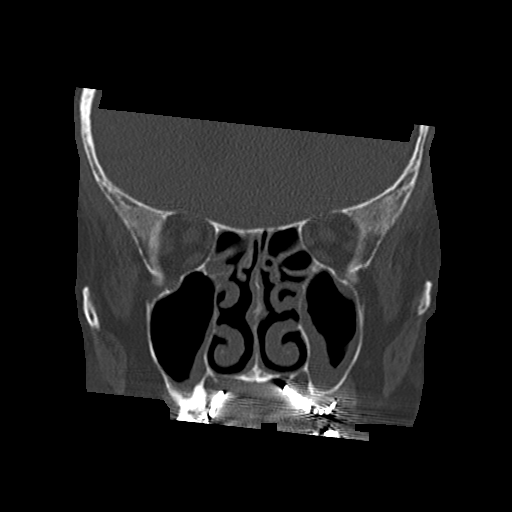
[im 43/58  bone]
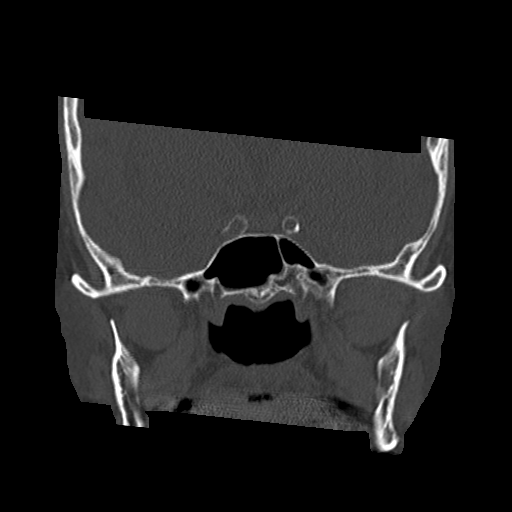

[Series 604: sag · sagittal · 0.34mm/px · 3 of 67 slices shown]
[im 23/67  bone]
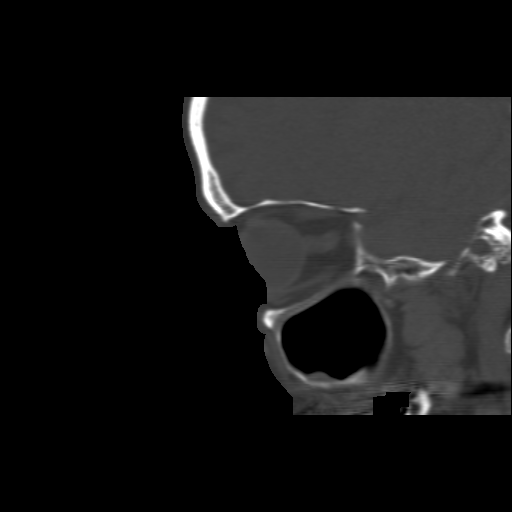
[im 34/67  bone]
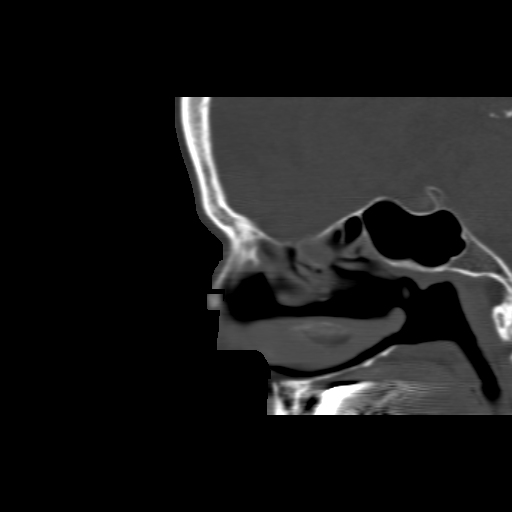
[im 45/67  bone]
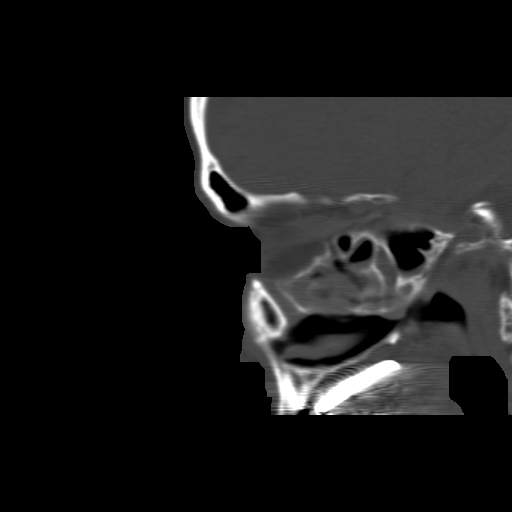

[16 of 47 positions shown; findings below may reference images not displayed]

FINDINGS: Circumferential mucosal thickening is present in the left maxillary
sinus. There is mucosal thickening along the medial and posterior
aspect of the right maxillary sinus. Circumferential mucosal
thickening scattered throughout the ethmoid air cells. Minimal
mucosal thickening is present along the inferior left frontal sinus.
The right frontal sinus is not pneumatized. The sphenoid sinuses are
clear. The visualized mastoid air cells are clear. Nasal septum is
midline. The nasal cavity is clear.
IMPRESSION: 1. Left greater than right circumferential mucosal thickening has
increased some since the prior study.
2. Mild to moderate diffuse ethmoid mucosal thickening is similar to
the prior study.
3. Minimal mucosal thickening along the inferior left frontal sinus.
The right frontal sinus is not pneumatized.

## 2014-06-24 MED ORDER — EPOETIN ALFA 40000 UNIT/ML IJ SOLN: 30000.0000 [IU] | INTRAMUSCULAR | Status: DC

## 2014-06-24 MED ORDER — EPOETIN ALFA 10000 UNIT/ML IJ SOLN
INTRAMUSCULAR | Status: AC
  Administered 2014-06-24: 10000 [IU] via SUBCUTANEOUS
  Filled 2014-06-24: qty 1

## 2014-06-24 MED ORDER — EPOETIN ALFA 20000 UNIT/ML IJ SOLN
INTRAMUSCULAR | Status: AC
  Administered 2014-06-24: 20000 [IU] via SUBCUTANEOUS
  Filled 2014-06-24: qty 1

## 2014-07-01 ENCOUNTER — Encounter (HOSPITAL_COMMUNITY)
Admission: RE | Admit: 2014-07-01 | Discharge: 2014-07-01 | Disposition: A | Discharge status: Home / Self Care | Payer: Medicare Other | Source: Ambulatory Visit | Attending: Nephrology | Admitting: Nephrology

## 2014-07-01 DIAGNOSIS — D631 Anemia in chronic kidney disease: Secondary | ICD-10-CM | POA: Diagnosis not present

## 2014-07-01 LAB — POCT HEMOGLOBIN-HEMACUE: Hemoglobin: 9.8 g/dL — ABNORMAL LOW (ref 13.0–17.0)

## 2014-07-01 MED ORDER — EPOETIN ALFA 10000 UNIT/ML IJ SOLN
INTRAMUSCULAR | Status: AC
  Administered 2014-07-01: 10000 [IU] via SUBCUTANEOUS
  Filled 2014-07-01: qty 1

## 2014-07-01 MED ORDER — EPOETIN ALFA 20000 UNIT/ML IJ SOLN
INTRAMUSCULAR | Status: AC
  Administered 2014-07-01: 20000 [IU] via SUBCUTANEOUS
  Filled 2014-07-01: qty 1

## 2014-07-01 MED ORDER — EPOETIN ALFA 40000 UNIT/ML IJ SOLN: 30000.0000 [IU] | INTRAMUSCULAR | Status: DC

## 2014-07-08 ENCOUNTER — Encounter (HOSPITAL_COMMUNITY)
Admission: RE | Admit: 2014-07-08 | Discharge: 2014-07-08 | Disposition: A | Discharge status: Home / Self Care | Payer: Medicare Other | Source: Ambulatory Visit | Attending: Nephrology | Admitting: Nephrology

## 2014-07-08 ENCOUNTER — Telehealth: Payer: Self-pay | Admitting: Internal Medicine

## 2014-07-08 DIAGNOSIS — D631 Anemia in chronic kidney disease: Secondary | ICD-10-CM | POA: Insufficient documentation

## 2014-07-08 DIAGNOSIS — N184 Chronic kidney disease, stage 4 (severe): Secondary | ICD-10-CM | POA: Insufficient documentation

## 2014-07-08 LAB — IRON AND TIBC
IRON: 33 ug/dL — AB (ref 42–135)
SATURATION RATIOS: 18 % — AB (ref 20–55)
TIBC: 184 ug/dL — AB (ref 215–435)
UIBC: 151 ug/dL (ref 125–400)

## 2014-07-08 LAB — POCT HEMOGLOBIN-HEMACUE: Hemoglobin: 9.8 g/dL — ABNORMAL LOW (ref 13.0–17.0)

## 2014-07-08 LAB — FERRITIN: Ferritin: 134 ng/mL (ref 22–322)

## 2014-07-08 MED ORDER — EPOETIN ALFA 20000 UNIT/ML IJ SOLN
INTRAMUSCULAR | Status: AC
  Administered 2014-07-08: 20000 [IU]
  Filled 2014-07-08: qty 1

## 2014-07-08 MED ORDER — EPOETIN ALFA 10000 UNIT/ML IJ SOLN
INTRAMUSCULAR | Status: AC
  Administered 2014-07-08: 10000 [IU]
  Filled 2014-07-08: qty 1

## 2014-07-08 MED ORDER — EPOETIN ALFA 40000 UNIT/ML IJ SOLN: 30000.0000 [IU] | INTRAMUSCULAR | Status: DC

## 2014-07-08 NOTE — Telephone Encounter (Signed)
OK to hold asa and plavix 10 days prior to biopsy - per Dr. Debara Pickett - which was communicated to Dr. Jimmy Footman via phone call between Dr. Jimmy Footman & Dr. Debara Pickett

## 2014-07-09 ENCOUNTER — Telehealth: Payer: Self-pay | Admitting: Internal Medicine

## 2014-07-09 ENCOUNTER — Other Ambulatory Visit (HOSPITAL_COMMUNITY): Payer: Self-pay | Admitting: Nephrology

## 2014-07-09 NOTE — Telephone Encounter (Signed)
Received records from [facility] (Dr Jeneen Rinks Deterding) for appointment on 07/17/14 with Dr Debara Pickett.  Records given to [facility] (medical records) for Dr [facility] schedule on 07/17/14.  lp

## 2014-07-10 ENCOUNTER — Other Ambulatory Visit (HOSPITAL_COMMUNITY): Payer: Self-pay | Admitting: Nephrology

## 2014-07-10 DIAGNOSIS — M3130 Wegener's granulomatosis without renal involvement: Secondary | ICD-10-CM

## 2014-07-14 ENCOUNTER — Encounter (HOSPITAL_COMMUNITY)
Admission: RE | Admit: 2014-07-14 | Discharge: 2014-07-14 | Disposition: A | Discharge status: Home / Self Care | Payer: Medicare Other | Source: Ambulatory Visit | Attending: Nephrology | Admitting: Nephrology

## 2014-07-14 ENCOUNTER — Other Ambulatory Visit: Payer: Self-pay | Admitting: Radiology

## 2014-07-14 ENCOUNTER — Other Ambulatory Visit (HOSPITAL_COMMUNITY): Payer: Self-pay | Admitting: *Deleted

## 2014-07-14 DIAGNOSIS — M3130 Wegener's granulomatosis without renal involvement: Secondary | ICD-10-CM | POA: Diagnosis present

## 2014-07-14 DIAGNOSIS — Z9841 Cataract extraction status, right eye: Secondary | ICD-10-CM | POA: Diagnosis not present

## 2014-07-14 DIAGNOSIS — K222 Esophageal obstruction: Secondary | ICD-10-CM | POA: Diagnosis not present

## 2014-07-14 DIAGNOSIS — I10 Essential (primary) hypertension: Secondary | ICD-10-CM | POA: Diagnosis not present

## 2014-07-14 DIAGNOSIS — Z9889 Other specified postprocedural states: Secondary | ICD-10-CM | POA: Diagnosis not present

## 2014-07-14 DIAGNOSIS — N400 Enlarged prostate without lower urinary tract symptoms: Secondary | ICD-10-CM | POA: Diagnosis not present

## 2014-07-14 DIAGNOSIS — K219 Gastro-esophageal reflux disease without esophagitis: Secondary | ICD-10-CM | POA: Diagnosis not present

## 2014-07-14 DIAGNOSIS — J45909 Unspecified asthma, uncomplicated: Secondary | ICD-10-CM | POA: Diagnosis not present

## 2014-07-14 DIAGNOSIS — Z9981 Dependence on supplemental oxygen: Secondary | ICD-10-CM | POA: Diagnosis not present

## 2014-07-14 DIAGNOSIS — D649 Anemia, unspecified: Secondary | ICD-10-CM | POA: Diagnosis not present

## 2014-07-14 DIAGNOSIS — K449 Diaphragmatic hernia without obstruction or gangrene: Secondary | ICD-10-CM | POA: Diagnosis not present

## 2014-07-14 DIAGNOSIS — Z9861 Coronary angioplasty status: Secondary | ICD-10-CM | POA: Diagnosis not present

## 2014-07-14 DIAGNOSIS — I724 Aneurysm of artery of lower extremity: Secondary | ICD-10-CM | POA: Diagnosis not present

## 2014-07-14 DIAGNOSIS — Z8619 Personal history of other infectious and parasitic diseases: Secondary | ICD-10-CM | POA: Diagnosis not present

## 2014-07-14 DIAGNOSIS — I2510 Atherosclerotic heart disease of native coronary artery without angina pectoris: Secondary | ICD-10-CM | POA: Diagnosis not present

## 2014-07-14 DIAGNOSIS — Z79899 Other long term (current) drug therapy: Secondary | ICD-10-CM | POA: Diagnosis not present

## 2014-07-14 DIAGNOSIS — Z9842 Cataract extraction status, left eye: Secondary | ICD-10-CM | POA: Diagnosis not present

## 2014-07-14 DIAGNOSIS — Z86010 Personal history of colonic polyps: Secondary | ICD-10-CM | POA: Diagnosis not present

## 2014-07-14 DIAGNOSIS — J479 Bronchiectasis, uncomplicated: Secondary | ICD-10-CM | POA: Diagnosis not present

## 2014-07-14 DIAGNOSIS — H9190 Unspecified hearing loss, unspecified ear: Secondary | ICD-10-CM | POA: Diagnosis not present

## 2014-07-14 LAB — CBC WITH DIFFERENTIAL/PLATELET
BASOS ABS: 0 10*3/uL (ref 0.0–0.1)
Basophils Relative: 1 % (ref 0–1)
Eosinophils Absolute: 0.2 10*3/uL (ref 0.0–0.7)
Eosinophils Relative: 3 % (ref 0–5)
HEMATOCRIT: 32 % — AB (ref 39.0–52.0)
HEMOGLOBIN: 10.2 g/dL — AB (ref 13.0–17.0)
LYMPHS PCT: 13 % (ref 12–46)
Lymphs Abs: 0.6 10*3/uL — ABNORMAL LOW (ref 0.7–4.0)
MCH: 32.7 pg (ref 26.0–34.0)
MCHC: 31.9 g/dL (ref 30.0–36.0)
MCV: 102.6 fL — AB (ref 78.0–100.0)
MONO ABS: 0.6 10*3/uL (ref 0.1–1.0)
Monocytes Relative: 13 % — ABNORMAL HIGH (ref 3–12)
NEUTROS ABS: 3.5 10*3/uL (ref 1.7–7.7)
Neutrophils Relative %: 70 % (ref 43–77)
Platelets: 237 10*3/uL (ref 150–400)
RBC: 3.12 MIL/uL — ABNORMAL LOW (ref 4.22–5.81)
RDW: 15.6 % — ABNORMAL HIGH (ref 11.5–15.5)
WBC: 5 10*3/uL (ref 4.0–10.5)

## 2014-07-14 LAB — PROTIME-INR
INR: 1.06 (ref 0.00–1.49)
Prothrombin Time: 13.9 seconds (ref 11.6–15.2)

## 2014-07-14 LAB — TYPE AND SCREEN
ABO/RH(D): O POS
Antibody Screen: NEGATIVE

## 2014-07-14 LAB — APTT: aPTT: 30 seconds (ref 24–37)

## 2014-07-15 ENCOUNTER — Observation Stay (HOSPITAL_COMMUNITY)
Admission: AD | Admit: 2014-07-15 | Discharge: 2014-07-16 | Disposition: A | Discharge status: Home / Self Care | Payer: Medicare Other | Source: Ambulatory Visit | Attending: Nephrology | Admitting: Nephrology

## 2014-07-15 ENCOUNTER — Inpatient Hospital Stay (HOSPITAL_COMMUNITY): Admission: RE | Admit: 2014-07-15 | Payer: Medicare Other | Source: Ambulatory Visit

## 2014-07-15 ENCOUNTER — Other Ambulatory Visit (HOSPITAL_COMMUNITY): Payer: Self-pay | Admitting: Nephrology

## 2014-07-15 ENCOUNTER — Encounter (HOSPITAL_COMMUNITY): Payer: Self-pay | Admitting: General Practice

## 2014-07-15 ENCOUNTER — Ambulatory Visit (HOSPITAL_COMMUNITY)
Admission: RE | Admit: 2014-07-15 | Discharge: 2014-07-15 | Disposition: A | Discharge status: Home / Self Care | Payer: Medicare Other | Source: Ambulatory Visit | Attending: Nephrology | Admitting: Nephrology

## 2014-07-15 VITALS — BP 142/71 | HR 67 | Temp 98.0°F | Resp 18 | Ht 67.0 in | Wt 118.0 lb

## 2014-07-15 DIAGNOSIS — Z79899 Other long term (current) drug therapy: Secondary | ICD-10-CM | POA: Insufficient documentation

## 2014-07-15 DIAGNOSIS — M3131 Wegener's granulomatosis with renal involvement: Secondary | ICD-10-CM

## 2014-07-15 DIAGNOSIS — M3130 Wegener's granulomatosis without renal involvement: Principal | ICD-10-CM | POA: Diagnosis present

## 2014-07-15 DIAGNOSIS — D649 Anemia, unspecified: Secondary | ICD-10-CM | POA: Insufficient documentation

## 2014-07-15 DIAGNOSIS — Z9981 Dependence on supplemental oxygen: Secondary | ICD-10-CM | POA: Insufficient documentation

## 2014-07-15 DIAGNOSIS — Z9889 Other specified postprocedural states: Secondary | ICD-10-CM | POA: Insufficient documentation

## 2014-07-15 DIAGNOSIS — J45909 Unspecified asthma, uncomplicated: Secondary | ICD-10-CM | POA: Insufficient documentation

## 2014-07-15 DIAGNOSIS — I724 Aneurysm of artery of lower extremity: Secondary | ICD-10-CM | POA: Insufficient documentation

## 2014-07-15 DIAGNOSIS — H9190 Unspecified hearing loss, unspecified ear: Secondary | ICD-10-CM | POA: Insufficient documentation

## 2014-07-15 DIAGNOSIS — J479 Bronchiectasis, uncomplicated: Secondary | ICD-10-CM | POA: Insufficient documentation

## 2014-07-15 DIAGNOSIS — K222 Esophageal obstruction: Secondary | ICD-10-CM | POA: Insufficient documentation

## 2014-07-15 DIAGNOSIS — I10 Essential (primary) hypertension: Secondary | ICD-10-CM | POA: Insufficient documentation

## 2014-07-15 DIAGNOSIS — Z9841 Cataract extraction status, right eye: Secondary | ICD-10-CM | POA: Insufficient documentation

## 2014-07-15 DIAGNOSIS — N400 Enlarged prostate without lower urinary tract symptoms: Secondary | ICD-10-CM | POA: Insufficient documentation

## 2014-07-15 DIAGNOSIS — Z8619 Personal history of other infectious and parasitic diseases: Secondary | ICD-10-CM | POA: Insufficient documentation

## 2014-07-15 DIAGNOSIS — Z9842 Cataract extraction status, left eye: Secondary | ICD-10-CM | POA: Insufficient documentation

## 2014-07-15 DIAGNOSIS — I2510 Atherosclerotic heart disease of native coronary artery without angina pectoris: Secondary | ICD-10-CM | POA: Insufficient documentation

## 2014-07-15 DIAGNOSIS — Z9861 Coronary angioplasty status: Secondary | ICD-10-CM | POA: Insufficient documentation

## 2014-07-15 DIAGNOSIS — K219 Gastro-esophageal reflux disease without esophagitis: Secondary | ICD-10-CM | POA: Insufficient documentation

## 2014-07-15 DIAGNOSIS — Z86010 Personal history of colonic polyps: Secondary | ICD-10-CM | POA: Insufficient documentation

## 2014-07-15 DIAGNOSIS — K449 Diaphragmatic hernia without obstruction or gangrene: Secondary | ICD-10-CM | POA: Insufficient documentation

## 2014-07-15 LAB — CBC
HCT: 30.4 % — ABNORMAL LOW (ref 39.0–52.0)
Hemoglobin: 9.7 g/dL — ABNORMAL LOW (ref 13.0–17.0)
MCH: 32.3 pg (ref 26.0–34.0)
MCHC: 31.9 g/dL (ref 30.0–36.0)
MCV: 101.3 fL — AB (ref 78.0–100.0)
PLATELETS: 205 10*3/uL (ref 150–400)
RBC: 3 MIL/uL — ABNORMAL LOW (ref 4.22–5.81)
RDW: 15.4 % (ref 11.5–15.5)
WBC: 4.5 10*3/uL (ref 4.0–10.5)

## 2014-07-15 LAB — PLATELET FUNCTION ASSAY: Collagen / Epinephrine: 104 seconds (ref 0–184)

## 2014-07-15 MED ORDER — SODIUM CHLORIDE 0.9 % IV SOLN: INTRAVENOUS | Status: DC

## 2014-07-15 MED ORDER — ACETAMINOPHEN 650 MG RE SUPP: 650.0000 mg | Freq: Four times a day (QID) | RECTAL | Status: DC | PRN

## 2014-07-15 MED ORDER — ONDANSETRON HCL 4 MG/2ML IJ SOLN: 4.0000 mg | Freq: Four times a day (QID) | INTRAMUSCULAR | Status: DC | PRN

## 2014-07-15 MED ORDER — SORBITOL 70 % SOLN: 30.0000 mL | Status: DC | PRN

## 2014-07-15 MED ORDER — OXYCODONE-ACETAMINOPHEN 5-325 MG PO TABS
1.0000 | ORAL_TABLET | ORAL | Status: DC | PRN
  Administered 2014-07-16: 1 via ORAL
  Filled 2014-07-15: qty 1

## 2014-07-15 MED ORDER — HYDROXYZINE HCL 25 MG PO TABS: 25.0000 mg | ORAL_TABLET | Freq: Three times a day (TID) | ORAL | Status: DC | PRN

## 2014-07-15 MED ORDER — ACETAMINOPHEN 325 MG PO TABS: 650.0000 mg | ORAL_TABLET | Freq: Four times a day (QID) | ORAL | Status: DC | PRN

## 2014-07-15 MED ORDER — SODIUM CHLORIDE 0.9 % IV SOLN
INTRAVENOUS | Status: DC
  Administered 2014-07-15: 14:00:00 via INTRAVENOUS

## 2014-07-15 MED ORDER — ZOLPIDEM TARTRATE 5 MG PO TABS
5.0000 mg | ORAL_TABLET | Freq: Once | ORAL | Status: AC
  Administered 2014-07-15: 5 mg via ORAL
  Filled 2014-07-15: qty 1

## 2014-07-15 MED ORDER — LORAZEPAM 0.5 MG PO TABS
ORAL_TABLET | ORAL | Status: AC
  Filled 2014-07-15: qty 1

## 2014-07-15 MED ORDER — ONDANSETRON HCL 4 MG PO TABS: 4.0000 mg | ORAL_TABLET | Freq: Four times a day (QID) | ORAL | Status: DC | PRN

## 2014-07-15 MED ORDER — DARBEPOETIN ALFA 150 MCG/0.3ML IJ SOSY
150.0000 ug | PREFILLED_SYRINGE | Freq: Once | INTRAMUSCULAR | Status: AC
  Administered 2014-07-15: 150 ug via SUBCUTANEOUS
  Filled 2014-07-15: qty 0.3

## 2014-07-15 MED ORDER — LORAZEPAM 0.5 MG PO TABS
4.0000 mg | ORAL_TABLET | Freq: Once | ORAL | Status: AC
  Administered 2014-07-15: 4 mg via ORAL
  Filled 2014-07-15: qty 8

## 2014-07-15 MED ORDER — DOCUSATE SODIUM 283 MG RE ENEM: 1.0000 | ENEMA | RECTAL | Status: DC | PRN

## 2014-07-15 MED ORDER — CAMPHOR-MENTHOL 0.5-0.5 % EX LOTN: 1.0000 "application " | TOPICAL_LOTION | Freq: Three times a day (TID) | CUTANEOUS | Status: DC | PRN

## 2014-07-15 MED ORDER — LIDOCAINE HCL (PF) 1 % IJ SOLN
INTRAMUSCULAR | Status: AC
  Filled 2014-07-15: qty 10

## 2014-07-15 MED ORDER — ZOLPIDEM TARTRATE 5 MG PO TABS: 5.0000 mg | ORAL_TABLET | Freq: Every evening | ORAL | Status: DC | PRN

## 2014-07-15 MED ORDER — NEPRO/CARBSTEADY PO LIQD: 237.0000 mL | Freq: Three times a day (TID) | ORAL | Status: DC | PRN

## 2014-07-15 MED ORDER — CALCIUM CARBONATE 1250 MG/5ML PO SUSP: 500.0000 mg | Freq: Four times a day (QID) | ORAL | Status: DC | PRN

## 2014-07-15 NOTE — Plan of Care (Signed)
Problem: Phase I Progression Outcomes Goal: Pain controlled with appropriate interventions Outcome: Completed/Met Date Met:  07/15/14     

## 2014-07-15 NOTE — Sedation Documentation (Signed)
Procedure complete, biopsy obtained. Pt vitals WNL, denies any pain or discomfort.

## 2014-07-15 NOTE — Sedation Documentation (Signed)
No sedation required for this procedure. Pt was premedicated by floor RN prior to coming to [name].

## 2014-07-15 NOTE — Procedures (Signed)
Patient in prone position.  Kidneys localized with U/S.  Prep clorohexadine, xylocaine LA.  Using U/S guidance 4 passes to obtain 2 cores of tissue.  EBL N/A.  Tolerated well.  His left kidney was quite mobile and took several attempts due to it moving with biopsy needle spring.  No evidence of hematoma post biopsy and he tolerated it well.

## 2014-07-15 NOTE — Progress Notes (Signed)
Admission note:  Arrival Method: Patient arrived to the unit on stretcher accompanied by the staff and family members. Mental Orientation: Assessment: See doc flow sheets. Skin: Warm, dry and intact. IV: IV present and intact Pain: Asleep. Fall Prevention Safety Plan: Patient and family educated about the fall prevention plan, understood and acknowledged. Admission Screening: In progress. 6700 Orientation: Patient has been oriented to the unit, staff and to the room.

## 2014-07-15 NOTE — Plan of Care (Signed)
Problem: Phase I Progression Outcomes Goal: Initial discharge plan identified Outcome: Completed/Met Date Met:  07/15/14     

## 2014-07-15 NOTE — H&P (Signed)
Richard [name] is an 78 y.o. male.  HPI: [name] is a 78yo WM with PMH sig for Wegener"s granulomatosis dx 2011 with mainly pulmonary involvement and has been treated with cytoxan for over 6 months with h/o relapses and now with worsening renal function.  Concern for renal involvement with poor toleration of cytaxan and he is undergoing renal biopsy to determine presence of renal involvement and assessment of fibrosis prior to consideration for rituxamib therapy  Primary Nephrologist Deterdint.  Past Medical History  Diagnosis Date  . GERD (gastroesophageal reflux disease)   . Asthma   . Fungal infection     lungs  . Cataract   . BPH (benign prostatic hyperplasia)     sees Dr. Risa Grill, biopsy June 2015 was benign   . Wegener's granulomatosis     sees Dr. [name]   . Peptic stricture of esophagus   . Anemia   . Hiatal hernia   . Adenomatous colon polyp   . Bronchiectasis   . On home oxygen therapy     uses 2 l/m nasally at bedtime  . HOH (hard of hearing)     bilaterally  . HTN (hypertension) 09/28/2013  . CAD (coronary artery disease)     a. [place] s/p [place] x3 and ultimately BMS to pLAD& POBA to CTO of circumflex/marginal vessel on 03/17/14 and staged BMS to [facility] on 03/21/14  . Femoral artery pseudo-aneurysm, right     a. s/p repair. Follow up with Dr. Kellie Simmering     Allergies:  Allergies  Allergen Reactions  . Factive [Gemifloxacin] Other (See Comments)    Ineffective     Medications: {medication reviewed/display:[id number] Results for orders placed or performed during the hospital encounter of 07/14/14 (from the past 48 hour(s))  Type and screen     Status: None   Collection Time: 07/14/14  9:15 AM  Result Value Ref Range   ABO/RH(D) O POS    Antibody Screen NEG    Sample Expiration 07/28/2014   APTT     Status: None   Collection Time: 07/14/14  9:18 AM  Result Value Ref Range   aPTT 30 24 - 37 seconds  Protime-INR     Status: None   Collection Time: 07/14/14  9:18 AM   Result Value Ref Range   Prothrombin Time 13.9 11.6 - 15.2 seconds   INR 1.06 0.00 - 1.49  CBC with Differential     Status: Abnormal   Collection Time: 07/14/14  9:18 AM  Result Value Ref Range   WBC 5.0 4.0 - 10.5 K/uL   RBC 3.12 (L) 4.22 - 5.81 MIL/uL   Hemoglobin 10.2 (L) 13.0 - 17.0 g/dL   HCT 32.0 (L) 39.0 - 52.0 %   MCV 102.6 (H) 78.0 - 100.0 fL   [name] 32.7 26.0 - 34.0 pg   MCHC 31.9 30.0 - 36.0 g/dL   RDW 15.6 (H) 11.5 - 15.5 %   Platelets 237 150 - 400 K/uL   Neutrophils Relative % 70 43 - 77 %   Neutro Abs 3.5 1.7 - 7.7 K/uL   Lymphocytes Relative 13 12 - 46 %   Lymphs Abs 0.6 (L) 0.7 - 4.0 K/uL   Monocytes Relative 13 (H) 3 - 12 %   Monocytes Absolute 0.6 0.1 - 1.0 K/uL   Eosinophils Relative 3 0 - 5 %   Eosinophils Absolute 0.2 0.0 - 0.7 K/uL   Basophils Relative 1 0 - 1 %   Basophils Absolute 0.0 0.0 -  0.1 K/uL    No results found.  Blood pressure 140/79, pulse 67, temperature 98 F (36.7 C), temperature source Oral, resp. rate 16, height 5\' 7"  (1.702 m), weight 53.524 kg (118 lb), SpO2 99 %. General appearance: thin white male in NAD Resp: decreased BS bilaterally Cardio: regular rate and rhythm and no rub Extremities: no edema  Assessment/Plan: 1. Wegener's granulomatosis with worsening renal function- admit for renal biopsy 2. CAD- plavix and aspirin on hold for 7 days prior to procedure, will resume tomorrow. 3. HTN- stable 4. Anemia of Chronic disease 5. RTA- on bicarb 6. Bronchiectasis 7. Chronic respiratory failure with hypoxia   Richard [name] A 07/15/2014, 11:15 AM

## 2014-07-16 ENCOUNTER — Other Ambulatory Visit (HOSPITAL_COMMUNITY): Payer: Self-pay | Admitting: Nephrology

## 2014-07-16 ENCOUNTER — Encounter (HOSPITAL_COMMUNITY): Payer: Self-pay | Admitting: Nephrology

## 2014-07-16 ENCOUNTER — Ambulatory Visit (HOSPITAL_COMMUNITY): Payer: Medicare Other

## 2014-07-16 DIAGNOSIS — M3130 Wegener's granulomatosis without renal involvement: Secondary | ICD-10-CM

## 2014-07-16 LAB — CBC
HCT: 31.7 % — ABNORMAL LOW (ref 39.0–52.0)
Hemoglobin: 10.1 g/dL — ABNORMAL LOW (ref 13.0–17.0)
MCH: 32.5 pg (ref 26.0–34.0)
MCHC: 31.9 g/dL (ref 30.0–36.0)
MCV: 101.9 fL — AB (ref 78.0–100.0)
PLATELETS: 216 10*3/uL (ref 150–400)
RBC: 3.11 MIL/uL — ABNORMAL LOW (ref 4.22–5.81)
RDW: 15.6 % — AB (ref 11.5–15.5)
WBC: 6.3 10*3/uL (ref 4.0–10.5)

## 2014-07-16 LAB — RENAL FUNCTION PANEL
ALBUMIN: 2.8 g/dL — AB (ref 3.5–5.2)
Anion gap: 12 (ref 5–15)
BUN: 28 mg/dL — ABNORMAL HIGH (ref 6–23)
CHLORIDE: 109 meq/L (ref 96–112)
CO2: 22 meq/L (ref 19–32)
CREATININE: 2.35 mg/dL — AB (ref 0.50–1.35)
Calcium: 8.2 mg/dL — ABNORMAL LOW (ref 8.4–10.5)
GFR, EST AFRICAN AMERICAN: 29 mL/min — AB (ref >90)
GFR, EST NON AFRICAN AMERICAN: 25 mL/min — AB (ref >90)
Glucose, Bld: 86 mg/dL (ref 70–99)
Phosphorus: 3.6 mg/dL (ref 2.3–4.6)
Potassium: 4.5 mEq/L (ref 3.7–5.3)
SODIUM: 143 meq/L (ref 137–147)

## 2014-07-16 NOTE — Plan of Care (Signed)
Problem: Phase I Progression Outcomes Goal: OOB as tolerated unless otherwise ordered Outcome: Completed/Met Date Met:  07/16/14

## 2014-07-16 NOTE — Discharge Instructions (Signed)
Limited exertion and no heavy lifting.  Refrain from rigorous activity for 2 weeks.  Ok to resume plavix and aspirin on Friday 07/19/14

## 2014-07-16 NOTE — Plan of Care (Signed)
Problem: Consults Goal: General Medical Patient Education See Patient Education Module for specific education.  Outcome: Completed/Met Date Met:  07/16/14

## 2014-07-16 NOTE — Plan of Care (Signed)
Problem: Phase I Progression Outcomes Goal: Voiding-avoid urinary catheter unless indicated Outcome: Not Applicable Date Met:  76/81/15 Goal: Hemodynamically stable Outcome: Completed/Met Date Met:  07/16/14 Goal: Other Phase I Outcomes/Goals Outcome: Not Applicable Date Met:  72/62/03  Problem: Phase II Progression Outcomes Goal: Progress activity as tolerated unless otherwise ordered Outcome: Completed/Met Date Met:  07/16/14 Goal: Discharge plan established Outcome: Completed/Met Date Met:  07/16/14 Goal: Vital signs remain stable Outcome: Completed/Met Date Met:  07/16/14 Goal: IV changed to normal saline lock Outcome: Completed/Met Date Met:  07/16/14 Goal: Obtain order to discontinue catheter if appropriate Outcome: Not Applicable Date Met:  55/97/41 Goal: Other Phase II Outcomes/Goals Outcome: Not Applicable Date Met:  63/84/53  Problem: Phase III Progression Outcomes Goal: Pain controlled on oral analgesia Outcome: Completed/Met Date Met:  07/16/14 Goal: Activity at appropriate level-compared to baseline (UP IN CHAIR FOR HEMODIALYSIS)  Outcome: Not Applicable Date Met:  64/68/03 Goal: Voiding independently Outcome: Completed/Met Date Met:  07/16/14 Goal: IV/normal saline lock discontinued Outcome: Completed/Met Date Met:  07/16/14 Goal: Foley discontinued Outcome: Not Applicable Date Met:  21/22/48 Goal: Discharge plan remains appropriate-arrangements made Outcome: Completed/Met Date Met:  07/16/14 Goal: Other Phase III Outcomes/Goals Outcome: Not Applicable Date Met:  25/00/37  Problem: Discharge Progression Outcomes Goal: Discharge plan in place and appropriate Outcome: Completed/Met Date Met:  07/16/14 Goal: Pain controlled with appropriate interventions Outcome: Completed/Met Date Met:  07/16/14 Goal: Hemodynamically stable Outcome: Completed/Met Date Met:  04/88/89 Goal: Complications resolved/controlled Outcome: Completed/Met Date Met:  07/16/14 Goal:  Tolerating diet Outcome: Completed/Met Date Met:  07/16/14 Goal: Activity appropriate for discharge plan Outcome: Completed/Met Date Met:  07/16/14 Goal: Other Discharge Outcomes/Goals Outcome: Not Applicable Date Met:  16/94/50

## 2014-07-16 NOTE — Plan of Care (Signed)
Problem: Consults Goal: Skin Care Protocol Initiated - if Braden Score 18 or less If consults are not indicated, leave blank or document N/A  Outcome: Not Applicable Date Met:  93/06/84 Goal: Nutrition Consult-if indicated Outcome: Not Applicable Date Met:  12/07/01 Goal: Diabetes Guidelines if Diabetic/Glucose > 140 If diabetic or lab glucose is > 140 mg/dl - Initiate Diabetes/Hyperglycemia Guidelines & Document Interventions  Outcome: Not Applicable Date Met:  55/73/37

## 2014-07-16 NOTE — Discharge Summary (Signed)
Physician Discharge Summary  Patient ID: Richard [name] MRN: [id number] DOB/AGE: April 25, 1936 78 y.o.  Admit date: 07/15/2014 Discharge date: 07/16/2014  Admission Diagnoses:Wegener's granulomatosis, possible renal involvement  Discharge Diagnoses: Wegener's granulomatosis, possible renal involvement Active Problems:   Wegener's granulomatosis   Discharged Condition: good  Hospital Course:  Richard [name] was brought in for US-guided renal biopsy of his left kidney to evaluate the possibility of Wegeners granulomatosis renal involvement in consideration of rituximab therapy since he has not responded well to cytoxan and prednisone.  He tolerated renal biopsy without incident, no evidence of hemorrhage, stable vital signs and was discharged to home and to follow up with Dr. Jimmy Footman for results of renal biopsy.  He is to resume plavix and aspirin in 3 days post biopsy and f/u with Dr. Debara Pickett.   Treatments: procedures: biopsy: L kidney  Blood pressure 129/65, pulse 91, temperature 98.9 F (37.2 C), temperature source Oral, resp. rate 18, weight 53.479 kg (117 lb 14.4 oz), SpO2 94 %.  Disposition: 01-Home or Self Care     Medication List    TAKE these medications        aspirin EC 81 MG tablet  Take 81 mg by mouth daily.     b complex vitamins tablet  Take 1 tablet by mouth daily.     bisacodyl 5 MG EC tablet  Commonly known as:  DULCOLAX  Take 5 mg by mouth 2 (two) times daily.     bisoprolol 5 MG tablet  Commonly known as:  ZEBETA  Take 0.5 tablets (2.5 mg total) by mouth daily.     budesonide-formoterol 160-4.5 MCG/ACT inhaler  Commonly known as:  SYMBICORT  Inhale 2 puffs into the lungs 2 (two) times daily.     CITRUCEL oral powder  Generic drug:  methylcellulose  Take 1 packet by mouth daily.     clopidogrel 75 MG tablet  Commonly known as:  PLAVIX  Take 1 tablet (75 mg total) by mouth daily with breakfast.     cyclophosphamide 50 MG tablet  Commonly known as:   CYTOXAN  Take 50 mg by mouth daily. Give on an empty stomach once daily at 1600. Take  1 hour before or 2 hours after meals.     dextromethorphan 30 MG/5ML liquid  Commonly known as:  DELSYM  Take 30 mg by mouth 2 (two) times daily as needed for cough.     dutasteride 0.5 MG capsule  Commonly known as:  AVODART  TAKE ONE CAPSULE BY MOUTH ONCE DAILY     ferrous sulfate 325 (65 FE) MG tablet  Take 1 tablet (325 mg total) by mouth daily with breakfast.     GAS RELIEF PO  Take by mouth.     guaiFENesin 600 MG 12 hr tablet  Commonly known as:  MUCINEX  Take 600 mg by mouth 2 (two) times daily.     levalbuterol 45 MCG/ACT inhaler  Commonly known as:  XOPENEX HFA  Inhale 1-2 puffs into the lungs every 4 (four) hours as needed for wheezing.     loratadine 10 MG tablet  Commonly known as:  CLARITIN  Take 10 mg by mouth daily.     nitroGLYCERIN 0.4 MG SL tablet  Commonly known as:  NITROSTAT  Place 1 tablet (0.4 mg total) under the tongue every 5 (five) minutes x 3 doses as needed for chest pain.     PEPCID PO  Take 1 tablet by mouth 2 (two) times daily. OTC.  predniSONE 10 MG tablet  Commonly known as:  DELTASONE  Take 1 tablet (10 mg total) by mouth daily with breakfast.     simvastatin 20 MG tablet  Commonly known as:  ZOCOR  Take 1 tablet (20 mg total) by mouth daily at 6 PM.     sodium bicarbonate 650 MG tablet  Take 2 tablets (1,300 mg total) by mouth 2 (two) times daily.     tamsulosin 0.4 MG Caps capsule  Commonly known as:  FLOMAX  Take 1 capsule (0.4 mg total) by mouth every evening.     traMADol 50 MG tablet  Commonly known as:  ULTRAM  1-2 every 4 hours as needed for cough or pain         Signed: Nioka Thorington A 07/16/2014, 12:31 PM

## 2014-07-16 NOTE — Progress Notes (Signed)
Patient Discharge: Disposition: Patient discharged home accompanied by wife. Education: Patient educated on discharge instructions, medications, prescriptions and follow-up appointment.   IV: Discontinued IV before discharge. Transportation: Patient transported in w/c accompanied by staff and wife. Belongings: patient took all her belongings with her.

## 2014-07-17 ENCOUNTER — Ambulatory Visit (INDEPENDENT_AMBULATORY_CARE_PROVIDER_SITE_OTHER): Payer: Medicare Other | Admitting: Internal Medicine

## 2014-07-17 ENCOUNTER — Encounter (HOSPITAL_COMMUNITY): Payer: Self-pay | Admitting: Cardiology

## 2014-07-17 VITALS — BP 122/64 | HR 78 | Ht 67.0 in | Wt 113.3 lb

## 2014-07-17 DIAGNOSIS — I255 Ischemic cardiomyopathy: Secondary | ICD-10-CM

## 2014-07-17 DIAGNOSIS — I2583 Coronary atherosclerosis due to lipid rich plaque: Secondary | ICD-10-CM

## 2014-07-17 DIAGNOSIS — I2510 Atherosclerotic heart disease of native coronary artery without angina pectoris: Secondary | ICD-10-CM

## 2014-07-17 DIAGNOSIS — E785 Hyperlipidemia, unspecified: Secondary | ICD-10-CM

## 2014-07-17 DIAGNOSIS — M3130 Wegener's granulomatosis without renal involvement: Secondary | ICD-10-CM

## 2014-07-17 NOTE — Progress Notes (Signed)
OFFICE NOTE  Chief Complaint:  Dyspnea  Primary Care Physician: [name], MD  HPI:  Richard [name] is a 78 y.o. male here for follow up after complex hospitalization, with a history of hypothyroidism, HLD, anemia, Wegener's granulomatosis and no prior cardiac disease who presented to Hospital with chest pain 03/13/14, + nuc study and cardiac cath 03/14/14 which revealed severe multivessel CAD involving all 4 major branches. Seen by Dr. Madolyn Frieze who deemed him unsuitable for CABG due to underlying comorbid conditions. ( sts score >30). It was elected to proceed with staged multivessel PCI . On 03/17/14 he had successful PCI to the LAD and POBA to a chronically occluded LCX. There remained high grade proximal disease to a large proximal Ramus branch as well as mid-RCA disease so staged PCI was planned at a later date due to pulmonary issues and renal dysfunction. This was further complicated by right femoral artery pseudoaneurysm requiring repair. PCI on 03/21/14 with rotablator atherectomy with BMS to the mid RCA. Due to length of fluoro time and large volume of contrast, it was decided to medically manage the ramus stenosis which Dr. Angelena Form felt was moderately stenosed and if he has recurrent anginal then consider PCI.   He recently was seen by one of our nurse practitioners who has been working on him to evaluate his anemia. He was also noted to be hyperkalemic. This is required for treatment with Kayexalate and dietary changes. Unfortunate dietary changes as caused him to lose a significant amount of weight. The family is now concerned about what he can do in order to keep his potassium down. He does have stage III chronic kidney disease.   Richard [name] returns today in follow-up. He is been followed by Dr. Jimmy Footman for stage IV kidney disease secondary to Wegener's granulomatosis. He has developed worsening renal function and has a high ANCA titer. He underwent renal biopsy 2 days ago with  the thought that he may be able to be switched from Cytoxan over to rituximab. From a cardiovascular standpoint he reports some improvement in his breathing however energy level is still not great. He was doing somewhat better up until his biopsy but he felt like he took somewhat of a step back after that. He is currently holding his antiplatelets but will resume them tomorrow.  PMHx:  Past Medical History  Diagnosis Date  . GERD (gastroesophageal reflux disease)   . Asthma   . Fungal infection     lungs  . Cataract   . BPH (benign prostatic hyperplasia)     sees Dr. Risa Grill, biopsy June 2015 was benign   . Wegener's granulomatosis     sees Dr. [name]   . Peptic stricture of esophagus   . Anemia   . Hiatal hernia   . Adenomatous colon polyp   . Bronchiectasis   . On home oxygen therapy     uses 2 l/m nasally at bedtime  . HOH (hard of hearing)     bilaterally  . HTN (hypertension) 09/28/2013  . CAD (coronary artery disease)     a. [place] s/p [place] x3 and ultimately BMS to pLAD& POBA to CTO of circumflex/marginal vessel on 03/17/14 and staged BMS to [facility] on 03/21/14  . Femoral artery pseudo-aneurysm, right     a. s/p repair. Follow up with Dr. Kellie Simmering     Past Surgical History  Procedure Laterality Date  . Hernia repair    . Eye surgery      cataracts  removed.   . Colonoscopy  08-16-11    per Dr. Fuller Plan, diverticulosis and polyps, repeat in 5 yrs   . Esophagogastroduodenoscopy (egd) with esophageal dilation  11-29-10    per Dr. Fuller Plan   . Cataract extraction, bilateral  12-18-12    bilateral  . Cholecystectomy N/A 12/21/2012    Procedure: LAPAROSCOPIC CHOLECYSTECTOMY WITH INTRAOPERATIVE CHOLANGIOGRAM;  Surgeon: Edward Jolly, MD;  Location: [facility] ORS;  Service: General;  Laterality: N/A;  . Hematoma evacuation Right 03/19/2014    Procedure: Suture repair of femoral artery with evacuation of hematoma;  Surgeon: Mal Misty, MD;  [facility];  Service: Vascular;  Laterality: Right;  .  Coronary angioplasty  03/2014  . Left heart catheterization with coronary angiogram N/A 03/14/2014    Procedure: LEFT HEART CATHETERIZATION WITH CORONARY ANGIOGRAM;  Surgeon: Leonie Man, MD;  Location: [facility] CATH LAB;  Service: Cardiovascular;  Laterality: N/A;  . Percutaneous coronary stent intervention (pci-s) N/A 03/17/2014    Procedure: PERCUTANEOUS CORONARY STENT INTERVENTION (PCI-S);  Surgeon: Troy Sine, MD;  Location: [facility] CATH LAB;  Service: Cardiovascular;  Laterality: N/A;  . Cardiac catheterization  03/17/2014    Procedure: CORONARY BALLOON ANGIOPLASTY;  Surgeon: Troy Sine, MD;  Location: [facility] CATH LAB;  Service: Cardiovascular;;  . Percutaneous coronary stent intervention (pci-s) N/A 03/21/2014    Procedure: PERCUTANEOUS CORONARY STENT INTERVENTION (PCI-S);  Surgeon: Burnell Blanks, MD;  Location: [facility] CATH LAB;  Service: Cardiovascular;  Laterality: N/A;    FAMHx:  Family History  Problem Relation Age of Onset  . Asthma Father   . Coronary artery disease Brother   . Coronary artery disease Brother   . Coronary artery disease Mother     SOCHx:   reports that he has never smoked. He has never used smokeless tobacco. He reports that he does not drink alcohol or use illicit drugs.  ALLERGIES:  Allergies  Allergen Reactions  . Factive [Gemifloxacin] Other (See Comments)    Ineffective     ROS: A comprehensive review of systems was negative except for: Constitutional: positive for fatigue and weight loss Respiratory: positive for dyspnea on exertion  HOME MEDS: Current Outpatient Prescriptions  Medication Sig Dispense Refill  . aspirin EC 81 MG tablet Take 81 mg by mouth daily.    Marland Kitchen b complex vitamins tablet Take 1 tablet by mouth daily.      . bisacodyl (DULCOLAX) 5 MG EC tablet Take 5 mg by mouth 2 (two) times daily.     . bisoprolol (ZEBETA) 5 MG tablet Take 0.5 tablets (2.5 mg total) by mouth daily. (Patient taking differently: Take 2.5 mg by mouth at  bedtime. ) 30 tablet 3  . budesonide-formoterol (SYMBICORT) 160-4.5 MCG/ACT inhaler Inhale 2 puffs into the lungs 2 (two) times daily.    . clopidogrel (PLAVIX) 75 MG tablet Take 1 tablet (75 mg total) by mouth daily with breakfast. 30 tablet 10  . cyclophosphamide (CYTOXAN) 50 MG tablet Take 50 mg by mouth daily. Give on an empty stomach once daily at 1600. Take  1 hour before or 2 hours after meals.    Marland Kitchen dextromethorphan (DELSYM) 30 MG/5ML liquid Take 30 mg by mouth 2 (two) times daily as needed for cough.    . dutasteride (AVODART) 0.5 MG capsule Take 0.5 mg by mouth every other day.    . Famotidine (PEPCID PO) Take 1 tablet by mouth 2 (two) times daily. OTC.    Marland Kitchen guaiFENesin (MUCINEX) 600 MG 12 hr tablet  Take 600 mg by mouth 2 (two) times daily.    Marland Kitchen levalbuterol (XOPENEX HFA) 45 MCG/ACT inhaler Inhale 1-2 puffs into the lungs every 4 (four) hours as needed for wheezing. (Patient taking differently: Inhale 1-2 puffs into the lungs every 4 (four) hours as needed for wheezing. For shortness of breath) 1 Inhaler 12  . loratadine (CLARITIN) 10 MG tablet Take 10 mg by mouth daily.     . methylcellulose (CITRUCEL) oral powder Take 1 packet by mouth daily.    . nitroGLYCERIN (NITROSTAT) 0.4 MG SL tablet Place 1 tablet (0.4 mg total) under the tongue every 5 (five) minutes x 3 doses as needed for chest pain. (Patient taking differently: Place 0.4 mg under the tongue every 5 (five) minutes as needed for chest pain. ) 25 tablet 12  . predniSONE (DELTASONE) 10 MG tablet Take 1 tablet (10 mg total) by mouth daily with breakfast. 100 tablet 2  . simvastatin (ZOCOR) 20 MG tablet Take 1 tablet (20 mg total) by mouth daily at 6 PM. 30 tablet 11  . sodium bicarbonate 650 MG tablet Take 2 tablets (1,300 mg total) by mouth 2 (two) times daily. 120 tablet 6  . tamsulosin (FLOMAX) 0.4 MG CAPS capsule Take 1 capsule (0.4 mg total) by mouth every evening. 30 capsule 11  . traMADol (ULTRAM) 50 MG tablet 1-2 every 4  hours as needed for cough or pain (Patient taking differently: Take 50 mg by mouth daily as needed for moderate pain. 1-2 every 4 hours as needed for cough or pain) 40 tablet 0   No current facility-administered medications for this visit.    LABS/IMAGING: Results for orders placed or performed during the hospital encounter of 07/15/14 (from the past 48 hour(s))  CBC     Status: Abnormal   Collection Time: 07/15/14  4:20 PM  Result Value Ref Range   WBC 4.5 4.0 - 10.5 K/uL   RBC 3.00 (L) 4.22 - 5.81 MIL/uL   Hemoglobin 9.7 (L) 13.0 - 17.0 g/dL   HCT 30.4 (L) 39.0 - 52.0 %   MCV 101.3 (H) 78.0 - 100.0 fL   [name] 32.3 26.0 - 34.0 pg   MCHC 31.9 30.0 - 36.0 g/dL   RDW 15.4 11.5 - 15.5 %   Platelets 205 150 - 400 K/uL  Renal function panel     Status: Abnormal   Collection Time: 07/16/14  5:40 AM  Result Value Ref Range   Sodium 143 137 - 147 mEq/L   Potassium 4.5 3.7 - 5.3 mEq/L   Chloride 109 96 - 112 mEq/L   CO2 22 19 - 32 mEq/L   Glucose, Bld 86 70 - 99 mg/dL   BUN 28 (H) 6 - 23 mg/dL   Creatinine, Ser 2.35 (H) 0.50 - 1.35 mg/dL   Calcium 8.2 (L) 8.4 - 10.5 mg/dL   Phosphorus 3.6 2.3 - 4.6 mg/dL   Albumin 2.8 (L) 3.5 - 5.2 g/dL   GFR calc non Af Amer 25 (L) >90 mL/min   GFR calc Af Amer 29 (L) >90 mL/min    Comment: (NOTE) The eGFR has been calculated using the CKD EPI equation. This calculation has not been validated in all clinical situations. eGFR's persistently <90 mL/min signify possible Chronic Kidney Disease.    Anion gap 12 5 - 15  CBC     Status: Abnormal   Collection Time: 07/16/14  5:40 AM  Result Value Ref Range   WBC 6.3 4.0 - 10.5 K/uL  RBC 3.11 (L) 4.22 - 5.81 MIL/uL   Hemoglobin 10.1 (L) 13.0 - 17.0 g/dL   HCT 31.7 (L) 39.0 - 52.0 %   MCV 101.9 (H) 78.0 - 100.0 fL   [name] 32.5 26.0 - 34.0 pg   MCHC 31.9 30.0 - 36.0 g/dL   RDW 15.6 (H) 11.5 - 15.5 %   Platelets 216 150 - 400 K/uL   No results found.  VITALS: BP 122/64 mmHg  Pulse 78  Ht '5\' 7"'   (1.702 m)  Wt 113 lb 4.8 oz (51.393 kg)  BMI 17.74 kg/m2  EXAM: General appearance: alert, appears older than stated age and no distress Neck: no carotid bruit and no JVD Lungs: diminished breath sounds bilaterally Heart: regular rate and rhythm, S1, S2 normal, no murmur, click, rub or gallop Abdomen: soft, non-tender; bowel sounds normal; no masses,  no organomegaly Extremities: extremities normal, atraumatic, no cyanosis or edema Pulses: 2+ and symmetric Skin: Pale, cool skin Neurologic: Grossly normal Psych: Normal  EKG: Normal sinus rhythm at 78  ASSESSMENT: 1. History of Wegener's granulomatosis on immunosuppression 2. Multivessel CAD s/p PCI to the LAD, LCX and RCA 3. Stage III CKD 4. Hyperkalemia 5. Ischemic cardiomyopathy, EF 40% 6.   Fatigue/weakness  PLAN: 1.   Richard [name] had reported some symptomatic improvement up until his recent renal biopsy. He has a high ANCA titer and there is discussion about possibly changing him over to rituximab. He recently had a renal biopsy and those results are pending. From a cardiac standpoint he has been revascularized to the extent that we can do. I asked that he restart his dual antiplatelet therapy since that he is now over his renal biopsy. We will need to see if he's had any improvement in EF based on his revascularization. I would recommend a repeat echocardiogram in early January to evaluate for improvement in EF. We will continue his current medications and plan to see him back in 3-6 months.  Pixie Casino, MD, [facility] Attending Cardiologist [facility]   HILTY,Kenneth C 07/17/2014, 2:17 PM

## 2014-07-17 NOTE — Patient Instructions (Addendum)
Your physician wants you to follow-up in: 6 months with Dr. Debara Pickett. You will receive a reminder letter in the mail two months in advance. If you don't receive a letter, please call our office to schedule the follow-up appointment.  Your physician has requested that you have an echocardiogram. Echocardiography is a painless test that uses sound waves to create images of your heart. It provides your doctor with information about the size and shape of your heart and how well your heart's chambers and valves are working. This procedure takes approximately one hour. There are no restrictions for this procedure. >> please schedule this for January 2016

## 2014-07-18 IMAGING — CR DG CHEST 2V
3 series · 3 of 3 positions shown · non-contrast
Comparison: DG CHEST 2 VIEW dated 11/07/2013; CT ANGIO CHEST W/CM
&/OR WO/CM dated 10/04/2013

CLINICAL DATA: Cough and congestion, shortness of breath for 5
months, fungal infection of lungs

EXAM:
CHEST  2 VIEW

[w chest lat]
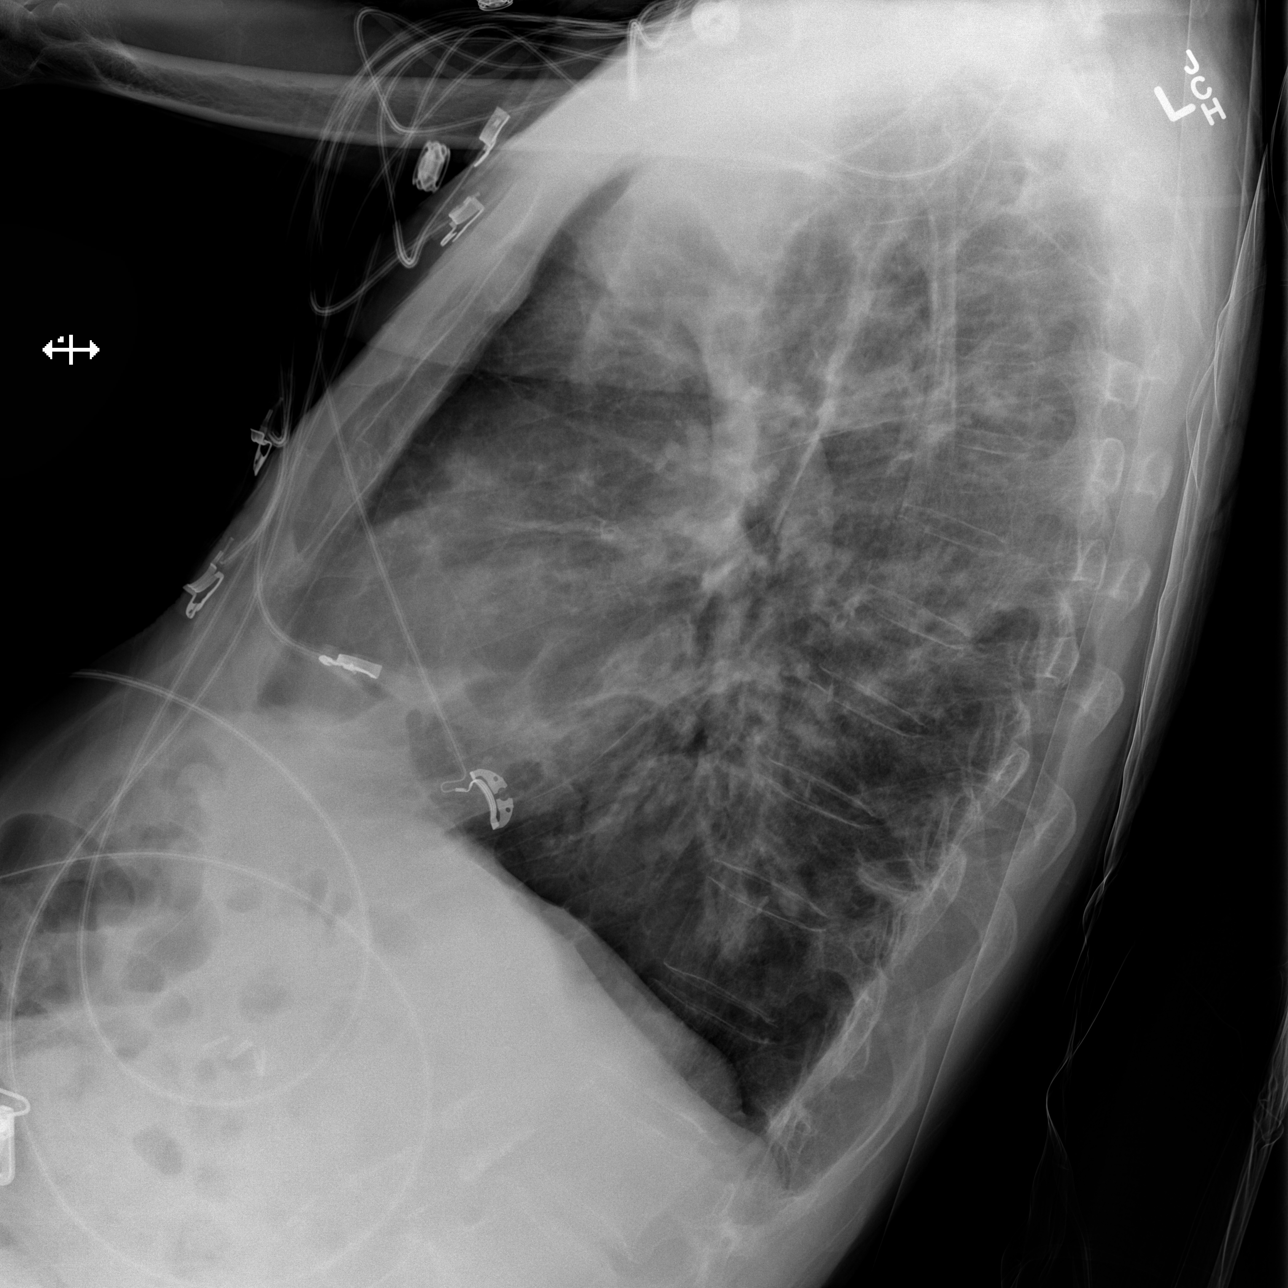

[x chest ap (1 of 2)]
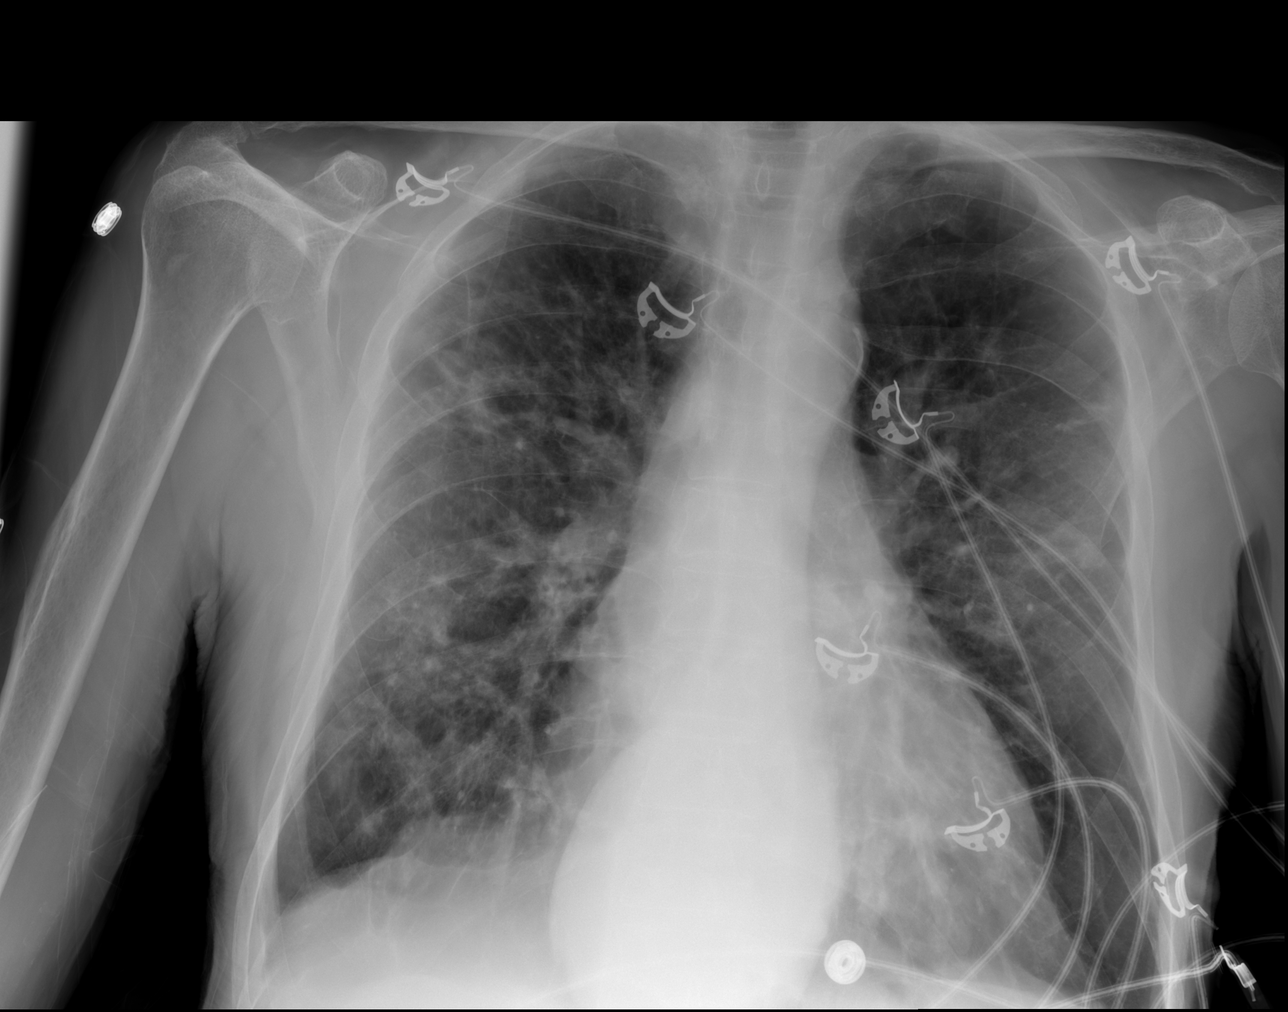

[x chest ap (2 of 2)]
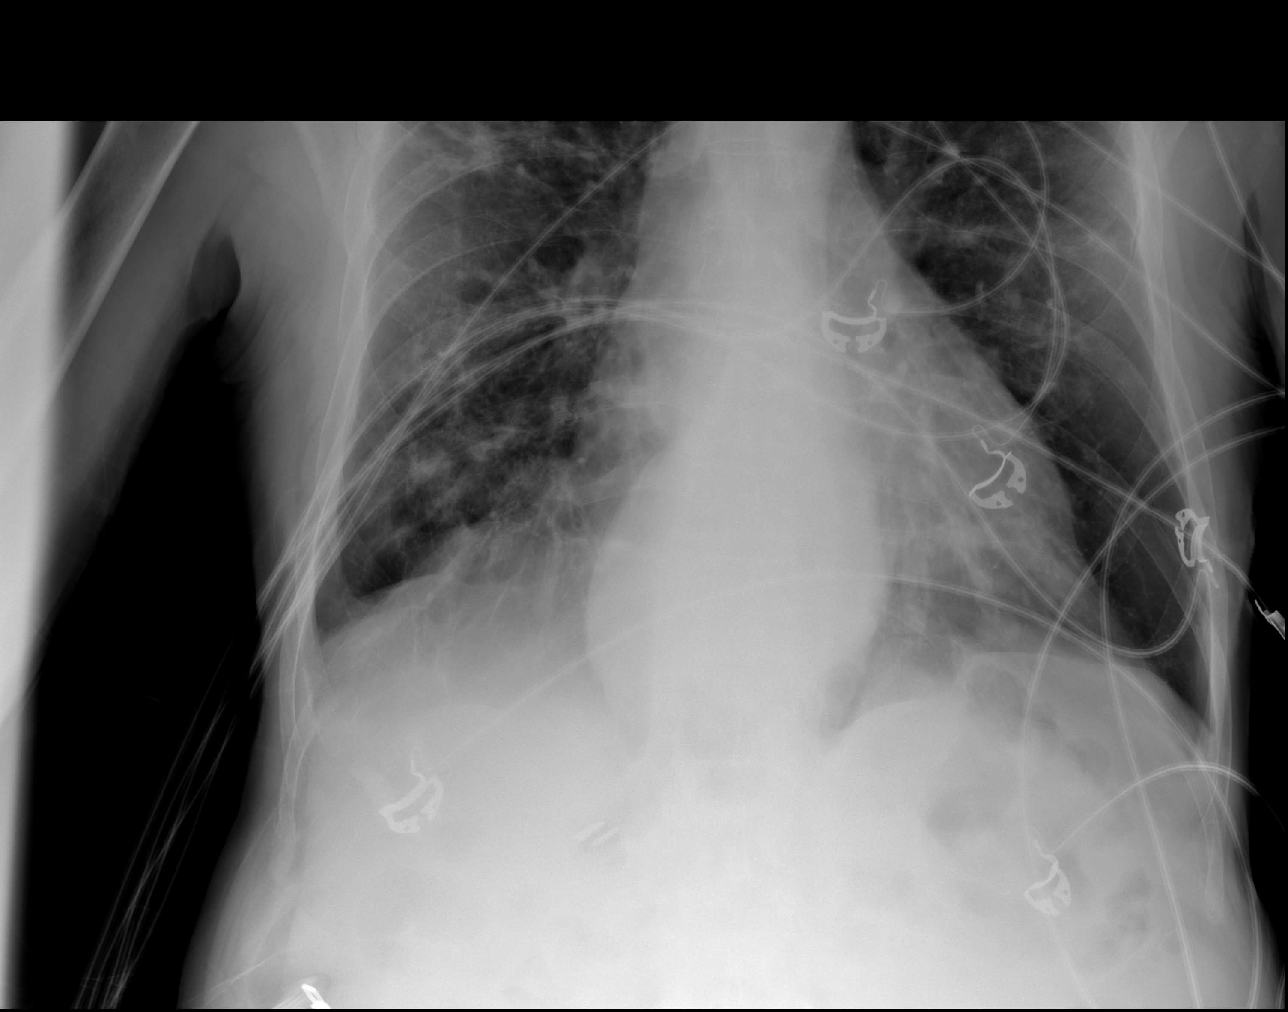

[3 of 3 positions shown; findings below may reference images not displayed]

FINDINGS: Mild cardiac enlargement. Vascular pattern normal. Moderate hiatal
hernia.

There is moderate bilateral interstitial change with multifocal
nodularity. When compared to the most recent prior study of
11/07/2013, the process is similar above mildly worse diffusely.
IMPRESSION: Mildly worse bilateral interstitial change with associated
multifocal nodularity when compared to November 07, 2013. This may
reflect finding of patient's known Manuel Manuel granulomatosis.
Concurrent atypical fungal infection is not excluded.

## 2014-07-22 ENCOUNTER — Encounter (HOSPITAL_COMMUNITY)
Admission: RE | Admit: 2014-07-22 | Discharge: 2014-07-22 | Disposition: A | Discharge status: Home / Self Care | Payer: Medicare Other | Source: Ambulatory Visit | Attending: Nephrology | Admitting: Nephrology

## 2014-07-22 DIAGNOSIS — D631 Anemia in chronic kidney disease: Secondary | ICD-10-CM | POA: Diagnosis not present

## 2014-07-22 LAB — POCT HEMOGLOBIN-HEMACUE: HEMOGLOBIN: 10.8 g/dL — AB (ref 13.0–17.0)

## 2014-07-22 MED ORDER — EPOETIN ALFA 20000 UNIT/ML IJ SOLN
INTRAMUSCULAR | Status: AC
  Administered 2014-07-22: 20000 [IU]
  Filled 2014-07-22: qty 1

## 2014-07-22 MED ORDER — EPOETIN ALFA 40000 UNIT/ML IJ SOLN: 30000.0000 [IU] | INTRAMUSCULAR | Status: DC

## 2014-07-22 MED ORDER — EPOETIN ALFA 10000 UNIT/ML IJ SOLN
INTRAMUSCULAR | Status: AC
  Administered 2014-07-22: 10000 [IU] via SUBCUTANEOUS
  Filled 2014-07-22: qty 1

## 2014-07-22 MED ORDER — FERUMOXYTOL INJECTION 510 MG/17 ML
1020.0000 mg | Freq: Once | INTRAVENOUS | Status: AC
  Administered 2014-07-22: 1020 mg via INTRAVENOUS
  Filled 2014-07-22: qty 34

## 2014-07-29 ENCOUNTER — Encounter (HOSPITAL_COMMUNITY): Payer: Self-pay

## 2014-07-29 ENCOUNTER — Encounter (HOSPITAL_COMMUNITY)
Admission: RE | Admit: 2014-07-29 | Discharge: 2014-07-29 | Disposition: A | Discharge status: Home / Self Care | Payer: Medicare Other | Source: Ambulatory Visit | Attending: Nephrology | Admitting: Nephrology

## 2014-07-29 DIAGNOSIS — D631 Anemia in chronic kidney disease: Secondary | ICD-10-CM | POA: Diagnosis not present

## 2014-07-29 LAB — POCT HEMOGLOBIN-HEMACUE: HEMOGLOBIN: 11.5 g/dL — AB (ref 13.0–17.0)

## 2014-07-29 MED ORDER — EPOETIN ALFA 20000 UNIT/ML IJ SOLN
INTRAMUSCULAR | Status: AC
  Administered 2014-07-29: 20000 [IU] via SUBCUTANEOUS
  Filled 2014-07-29: qty 1

## 2014-07-29 MED ORDER — EPOETIN ALFA 10000 UNIT/ML IJ SOLN
INTRAMUSCULAR | Status: AC
  Administered 2014-07-29: 10000 [IU] via SUBCUTANEOUS
  Filled 2014-07-29: qty 1

## 2014-07-29 MED ORDER — EPOETIN ALFA 40000 UNIT/ML IJ SOLN: 30000.0000 [IU] | INTRAMUSCULAR | Status: DC

## 2014-07-30 IMAGING — CT CT ABD-PELV W/ CM
2 of 5 series · 15 of 46 positions shown, 17 images · IV contrast (omnipaque)
Comparison: No priors.

CLINICAL DATA: Recent weight loss for the past 2 months. Abdominal
pain.

EXAM:
CT ABDOMEN AND PELVIS WITH CONTRAST
TECHNIQUE: Multidetector CT imaging of the abdomen and pelvis was performed
using the standard protocol following bolus administration of
intravenous contrast.
CONTRAST:  80mL OMNIPAQUE IOHEXOL 300 MG/ML  SOLN

[Series 2: abd/ pel 5mm · axial · 0.70mm/px · z∈[-488,-128]mm · 12 of 82 slices shown, 14 images]
[im 5/82  soft-tissue]
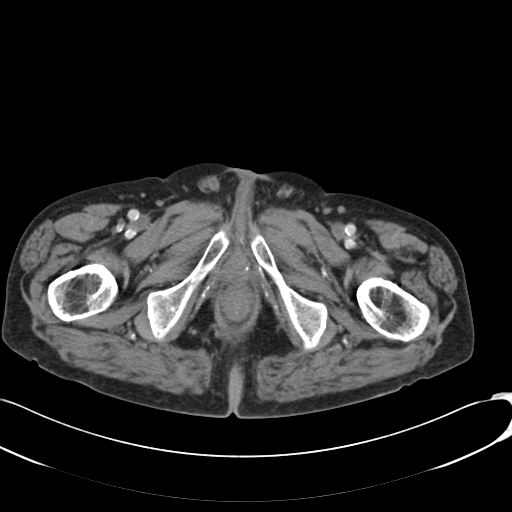
[im 5/82  bone]
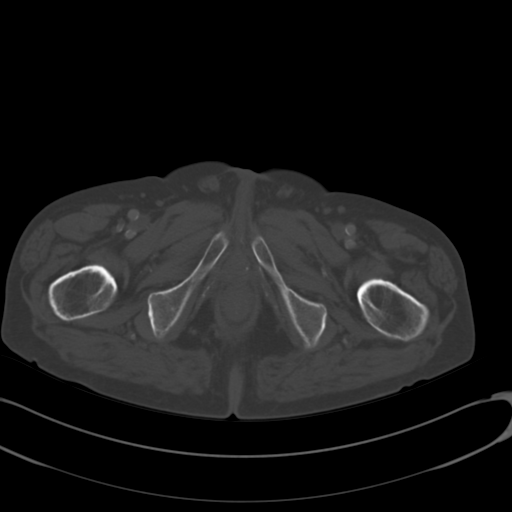
[im 14/82  soft-tissue]
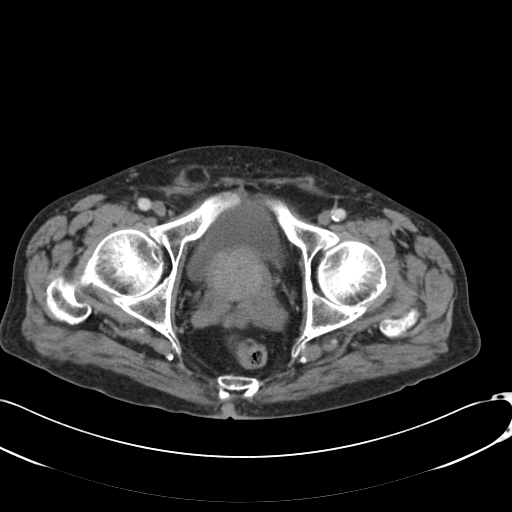
[im 19/82  soft-tissue]
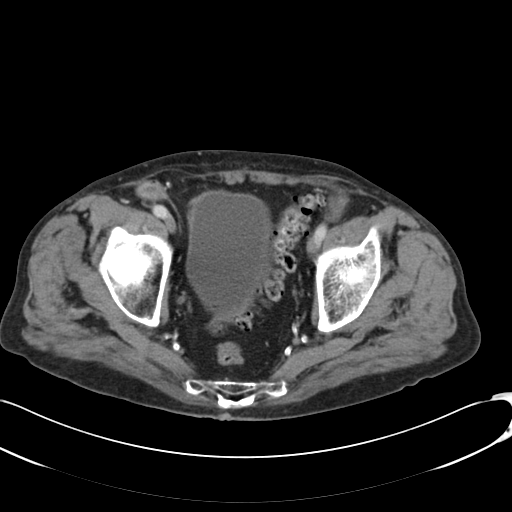
[im 23/82  soft-tissue]
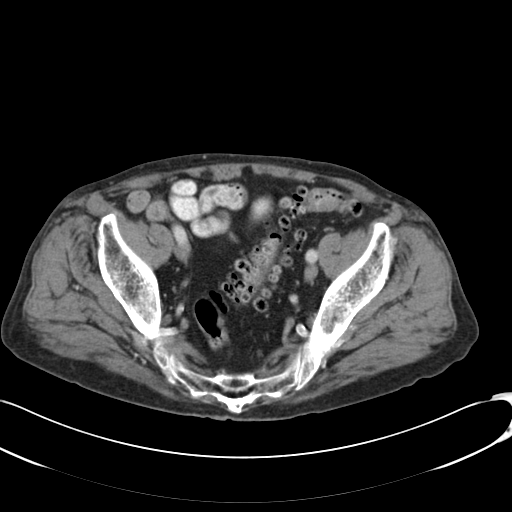
[im 32/82  soft-tissue]
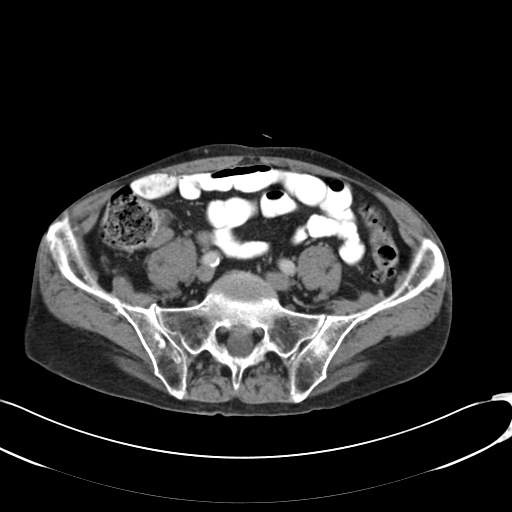
[im 37/82  soft-tissue]
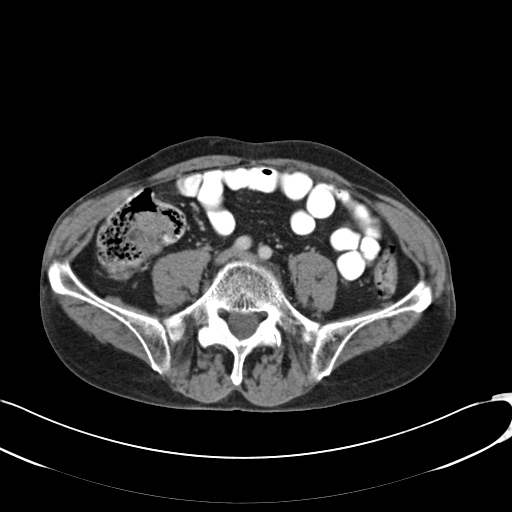
[im 46/82  soft-tissue]
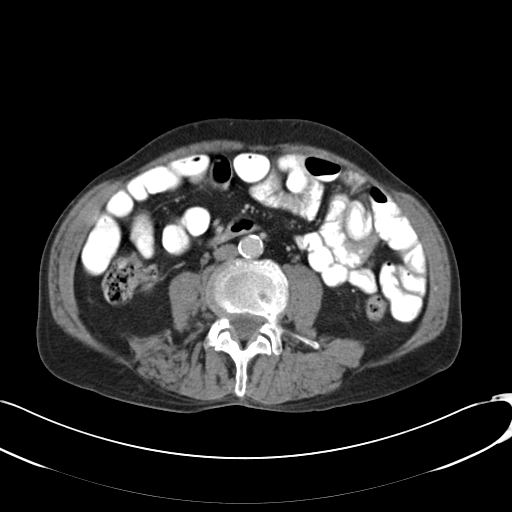
[im 50/82  soft-tissue]
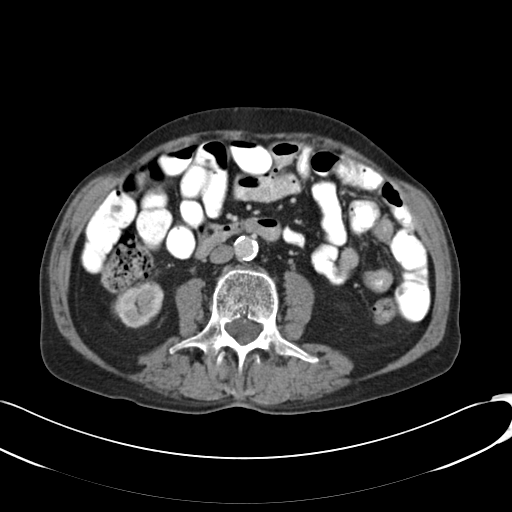
[im 59/82  soft-tissue]
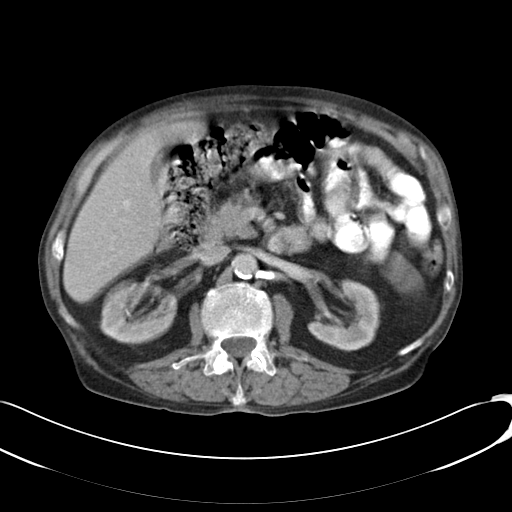
[im 59/82  bone]
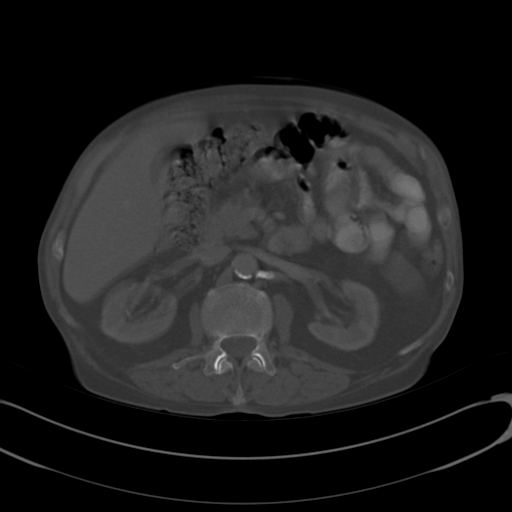
[im 64/82  soft-tissue]
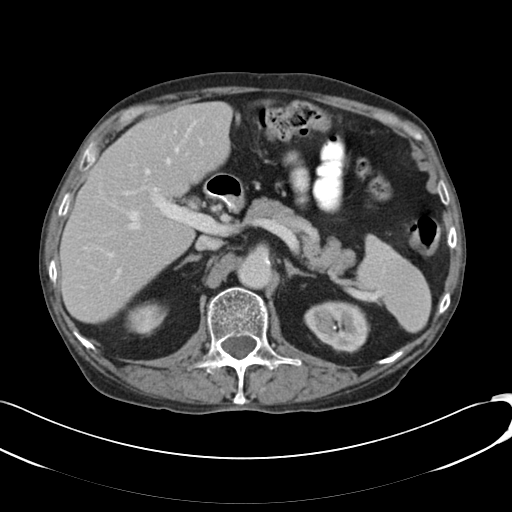
[im 68/82  soft-tissue]
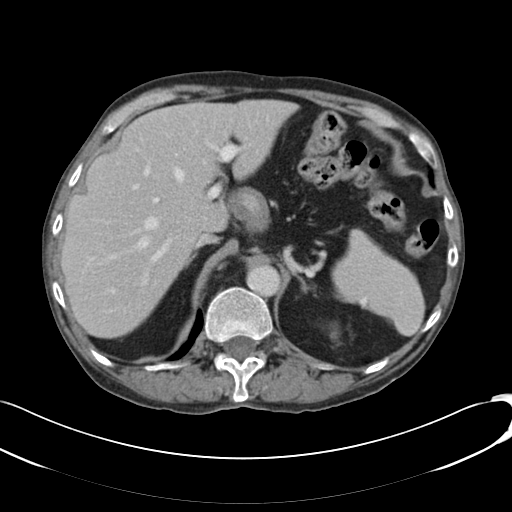
[im 77/82  soft-tissue]
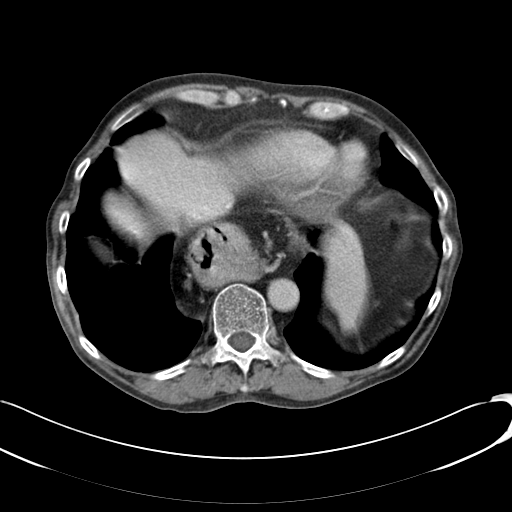

[Series 602: coronals · coronal · 0.82mm/px · 3 of 104 slices shown]
[im 35/104  soft-tissue]
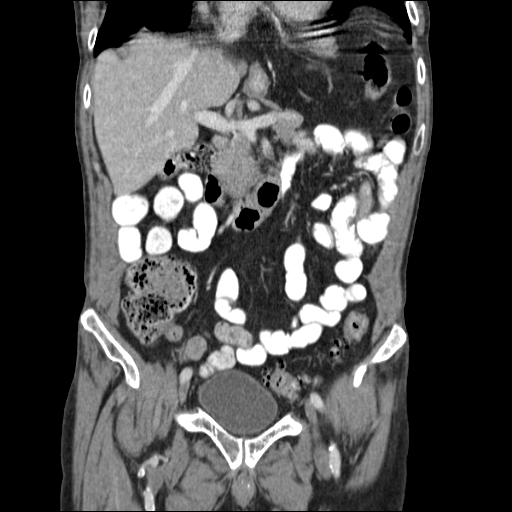
[im 46/104  soft-tissue]
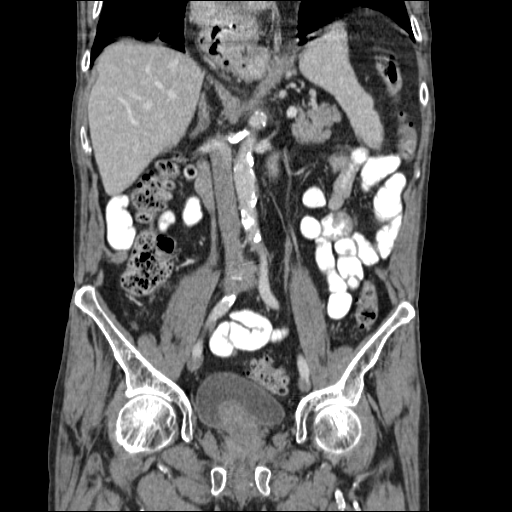
[im 58/104  soft-tissue]
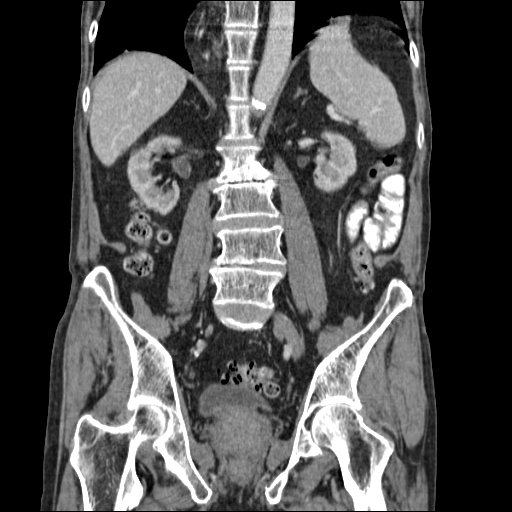

[15 of 46 positions shown; findings below may reference images not displayed]

FINDINGS: Lung Bases: Bronchiectatic changes are noted in the right lower
lobe. Large hiatal hernia.

Abdomen/Pelvis: Status post cholecystectomy. The appearance of the
liver, pancreas, spleen and bilateral adrenal glands is
unremarkable. Sub cm low-attenuation lesion in the upper pole of the
right kidney is too small to definitively characterize. 3 mm
nonobstructive calculi are present in the interpolar collecting
system of the right kidney and in the upper pole collecting system
of the left kidney. No ureteral calculi or findings of urinary tract
obstruction at this time. Mild bilateral renal atrophy. Extensive
atherosclerosis throughout the abdominal and pelvic vasculature,
without evidence of aneurysm or dissection. However, there are
heavily calcified plaques at the origins of both renal arteries, and
image 31 of series 2 demonstrates a small left-sided penetrating
ulcer of the infrarenal abdominal aorta immediately adjacent to the
origin of the inferior mesenteric artery. The inferior mesenteric
artery is grossly patent, although there may be some narrowing at
the origin. There also is some potential irregularity and narrowing
of the proximal superior mesenteric artery best demonstrated on
images 24-26 of series 2, however, this region of the study is
limited by extensive patient motion. Accordingly, the perceived
narrowing of this vessel may be artifactual.

Numerous colonic diverticulae are noted, particularly in the region
of the descending and sigmoid colon, without surrounding
inflammatory changes to suggest an acute diverticulitis at this
time. Normal appendix. No significant volume of ascites. No
pneumoperitoneum. No pathologic distention of small bowel. No
lymphadenopathy identified within the abdomen or pelvis. Prostate
gland is markedly enlarged and heterogeneous in appearance measuring
up to 4.9 x 5.3 cm, with severe median lobe hypertrophy. Urinary
bladder is unremarkable in appearance.

Musculoskeletal: Several sclerotic lesions are noted throughout the
visualized axial and appendicular skeleton. Although many of these
appear to represent bone islands, several are somewhat more
ill-defined and nonspecific in appearance. The largest of these
lesions is a 2.1 x 1.6 cm lesion in the medial aspect of the left
ilium just lateral to the sacroiliac joint (image 53 of series 2).
IMPRESSION: 1. No acute findings in the abdomen to explain the patient's
symptoms.
2. However, there is extensive atherosclerosis, including a small
penetrating ulcer of the infrarenal abdominal aorta. In addition,
and there is potential narrowing of the proximal superior mesenteric
artery, however, this region of the vessel is partially obscured by
motion. Whether or not this is a true finding or artifactual is
uncertain, however, given the patient's symptomatology, further
evaluation with dedicated CTA of the abdomen and pelvis is
suggested.
3. Extensive colonic diverticulosis without findings to suggest
acute diverticulitis at this time.
4. 3 mm nonobstructive calculi in the collecting systems of both
kidneys. No ureteral stones or findings of urinary tract obstruction
are noted at this time.
5. Enlarged and heterogeneous appearing prostate gland with severe
median lobe hypertrophy. In addition, there are multiple sclerotic
lesions in the bones. Although several of these sclerotic lesions
likely represent bone islands, the possibility of sclerotic
metastases warrants consideration. Correlation with PSA levels and
physical examination of the prostate gland is recommended.

## 2014-08-05 ENCOUNTER — Encounter (HOSPITAL_COMMUNITY)
Admission: RE | Admit: 2014-08-05 | Discharge: 2014-08-05 | Disposition: A | Discharge status: Home / Self Care | Payer: Medicare Other | Source: Ambulatory Visit | Attending: Nephrology | Admitting: Nephrology

## 2014-08-05 DIAGNOSIS — D631 Anemia in chronic kidney disease: Secondary | ICD-10-CM | POA: Diagnosis not present

## 2014-08-05 LAB — POCT HEMOGLOBIN-HEMACUE: HEMOGLOBIN: 12.4 g/dL — AB (ref 13.0–17.0)

## 2014-08-05 MED ORDER — EPOETIN ALFA 10000 UNIT/ML IJ SOLN
INTRAMUSCULAR | Status: AC
  Filled 2014-08-05: qty 1

## 2014-08-05 MED ORDER — EPOETIN ALFA 40000 UNIT/ML IJ SOLN: 30000.0000 [IU] | INTRAMUSCULAR | Status: DC

## 2014-08-05 MED ORDER — EPOETIN ALFA 20000 UNIT/ML IJ SOLN
INTRAMUSCULAR | Status: AC
  Filled 2014-08-05: qty 1

## 2014-08-19 ENCOUNTER — Telehealth: Payer: Self-pay | Admitting: Internal Medicine

## 2014-08-19 ENCOUNTER — Ambulatory Visit (INDEPENDENT_AMBULATORY_CARE_PROVIDER_SITE_OTHER): Payer: Medicare Other | Admitting: Internal Medicine

## 2014-08-19 ENCOUNTER — Ambulatory Visit (HOSPITAL_COMMUNITY)
Admission: RE | Admit: 2014-08-19 | Discharge: 2014-08-19 | Disposition: A | Discharge status: Home / Self Care | Payer: Medicare Other | Source: Ambulatory Visit | Attending: Cardiovascular Disease | Admitting: Cardiovascular Disease

## 2014-08-19 ENCOUNTER — Encounter: Payer: Self-pay | Admitting: Internal Medicine

## 2014-08-19 ENCOUNTER — Ambulatory Visit (INDEPENDENT_AMBULATORY_CARE_PROVIDER_SITE_OTHER)
Admission: RE | Admit: 2014-08-19 | Discharge: 2014-08-19 | Disposition: A | Discharge status: Home / Self Care | Payer: Medicare Other | Source: Ambulatory Visit | Attending: Internal Medicine | Admitting: Internal Medicine

## 2014-08-19 ENCOUNTER — Encounter (HOSPITAL_COMMUNITY): Payer: Self-pay

## 2014-08-19 ENCOUNTER — Encounter (HOSPITAL_COMMUNITY): Payer: Medicare Other

## 2014-08-19 VITALS — BP 118/78 | HR 90 | Ht 67.0 in | Wt 109.0 lb

## 2014-08-19 DIAGNOSIS — K219 Gastro-esophageal reflux disease without esophagitis: Secondary | ICD-10-CM

## 2014-08-19 DIAGNOSIS — I255 Ischemic cardiomyopathy: Secondary | ICD-10-CM

## 2014-08-19 DIAGNOSIS — J471 Bronchiectasis with (acute) exacerbation: Secondary | ICD-10-CM

## 2014-08-19 MED ORDER — AMOXICILLIN-POT CLAVULANATE 875-125 MG PO TABS: 1.0000 | ORAL_TABLET | Freq: Two times a day (BID) | ORAL | Status: DC

## 2014-08-19 NOTE — Telephone Encounter (Signed)
Spoke with MW. Pt has been added on to his schedule today at 4:15pm.

## 2014-08-19 NOTE — Patient Instructions (Addendum)
Increase prednisone to 15 mg per day until better then back to 7.5 mg daily   Augmentin 875 mg take one pill twice daily  X 10 days - take at breakfast and supper with large glass of water.  It would help reduce the usual side effects (diarrhea and yeast infections) if you ate cultured yogurt at lunch.   For breathing/coughing/ congestion >  Up to 2 puff every 4 hours as needed   Make sure you are taking pepcid 20 mg after bfast and supper   See Tammy NP w/in 2 weeks with all your medications, even over the counter meds, separated in two separate bags, the ones you take no matter what vs the ones you stop once you feel better and take only as needed when you feel you need them.   Tammy  will generate for you a new user friendly medication calendar that will put [name] all on the same page re: your medication use.     Without this process, it simply isn't possible to assure that we are providing  your outpatient care  with  the attention to detail we feel you deserve.   If we cannot assure that you're getting that kind of care,  then we cannot manage your problem effectively from this clinic.  Once you have seen Tammy and we are sure that we're all on the same page with your medication use she will arrange follow up with me.

## 2014-08-19 NOTE — Progress Notes (Signed)
2D Echo Performed 08/19/2014    Marygrace Drought, RCS

## 2014-08-19 NOTE — Progress Notes (Signed)
Subjective:    Patient ID: Richard [name], male    DOB: 03-08-1936     MRN: [id number]   Brief patient profile:  21  yowm never smoker with dx of Longstanding bronchiectasis then dx with WG 03/2010 > requested establish with Ebony Rickel.   Admit [facility] dx WG by pos c anca 03/2010, supportive tbbx but not vats   Admit [name] dx spont L Ptx 9/08/15/09 requiring Chest tube but no surgery   05/24/10--NP ov/ med reivew. Since last visit. has been hospitalized 05/12/2010-- 10/10/2011for Right pneumothorax., with underlying Wegener granulomatosis. and . Bronchiectasis. Pt was found to have Right Pneumo. Chest tube was placed. with improvement and removed 10/7. Xray on 10/10 w/ resolved pnuemo. He was continued on cytoxan and bactrim for PCP prophylaxis. rec. Prednisone 20 mg reduce to one half daily  Aspirin 81 mg one daily with breakfast but stop for any bleeding  See Patient Care Coordinator before leaving for cardiology appt > saw [name], ? LHC needed       11/23/2012 f/u ov/Richard [name] re WG/ bronchiectasis Chief Complaint  Patient presents with  . Follow-up    Had PFT today-sob same,cough-yellow,wheezing occass.,  no hemoptysis, no real limiting sob though sedentary, no arthralgias or rash maintaining 2.5 mg daily No change prednisone 5 mg one half daily  > ok to try every other day if doing great May 1 flare rx levaquin and pred May 16 lap chole [facility]     08/19/2013 f/u ov/Richard [name] re: ? Recurrent WG Chief Complaint  Patient presents with  . Follow-up    Breathing is unchanged. Cough unchanged-prod cpugh w/ yellow phlem. On 2nd dose of cipro  now on day 5/10 cipro and less dark, never bloody, no sinus complaints, arthritis rash or fever or hematuria. >ANCA 1:80 titer, started on cytoxan        09/27/2013 f/u ov/Richard [name] re:  WG on pred 10 and ctx 50 mg daily  Chief Complaint  Patient presents with  . Follow-up    Pt states cough and SOB seems worse since the last visit. Still producing yellow  sputum. He states has low grade temp every am.   assoc with nasal congestion but no epistaxis or hemoptysis  rec Stop Zebeta Schedule sinus CT> 2/09/14/13 Mild to moderate mucosal thickening in the paranasal sinuses without  air-fluid level. For cough use the mucinex dm up to 1200 mg every 12 hours and as much flutter valve as possible For breathing use xopenex up to 2 puffs every 4 hours if needed and don't leave home wihout it   Admit date: 10/04/2013  Discharge date: 10/07/2013  Discharge Diagnoses:  SOB (shortness of breath)  Wegener's granulomatosis  Bronchiectasis with acute exacerbation  SVT (supraventricular tachycardia)  Cough  Weakness generalized  Discharge Condition: Stable and improved  Filed Weights    10/04/13 1539   Weight:  58.423 kg (128 lb 12.8 oz)   History of present illness:  Patient is a pleasant 79 year old white man with a past medical history significant for Wegener's granulomatosis diagnosed in 2011 was on Cytoxan until 2013. He follows with Dr. [name]. He is maintained on prednisone 10 mg daily. He had been feeling well until mid January at which point a chest x-ray showed increased nodules and Dr. [name] decided to restart Cytoxan. Patient came into the hospital with the above-mentioned complaints. His heart rate was noted to be in the 180s in the rhythm of SVT. He converted to sinus rhythm spontaneously. CT scan of  the chest shows bronchiectasis as well as numerous cavitary and non-cavitary nodules. We have been asked to admit him for further evaluation and management. In addition to his cough and mild shortness of breath, he endorses low-grade temperatures for about 2 weeks as well as increasing sinus symptoms.  Hospital Course:  Wegener's Granulomatosis/Bronchiectasis with Acute Exacerbation  -Cx data remains negative to date.  -As discussed with Dr. [name], will transition antibiotics to cipro for 3 more days, continue cytoxan (bactrim while on cytoxan for PCP  prophylaxis), and slow steroid taper until seen again by Dr. [name].  SVT  -Spontaneously converted to NSR.          12/06/13 to ER with dehyrdration   01/09/2014 f/u ov/Richard [name] re:  Chief Complaint  Patient presents with  . Follow-up    Pt c/o non prod cough and states that his breathing is no better. He c/o poor appetite.   rec If mucus gets nasty,  Levaquin 750 x 7 days If mucus gets thick >  mucinex dm 1200 mg every 12 h and use the flutter valve   02/12/2014 f/u ov/Richard [name] re: bronchiectasis / pred 10 / cyt 2 Chief Complaint  Patient presents with  . Follow-up    Pt states that his symptoms are unchanged since last visit. Finished round of levaquin x 2 days ago.  No new co's today.   ok at [employer] leaning on the cart  New occ chest discomfort when sob x sev months, goes away at rest. No assoc diaph, nausea, radiation Has never tried xopenex for this symptom to date, encouraged to do so rec Please see patient coordinator before you leave today  to schedule VEST Tdap today      Patient ID: Richard [name]  MRN: [id number], DOB/AGE: 05-Nov-1935 79 y.o.  Admit date: 03/13/2014  D/C date: 03/26/2014   Primary Cardiologist: Dr. Debara Pickett  Principal Problem:  Unstable Class III Angina  Active Problems:  HYPOTHYROIDISM  HYPERLIPIDEMIA  ANEMIA  Wegener's granulomatosis  Chronic respiratory failure  Abnormal nuclear stress test: Intermediate risk with moderate region of apical and inferior scar with mild superimposed ischemia; EF 40%  CAD- severe 4 V CAD- not CABG candidate  Anemia- transfused 03/16/14  Chronic renal insufficiency, stage III (moderate)  Cardiomyopathy, ischemic- EF 40% by Myoview  Acute blood loss anemia  Femoral artery pseudo-aneurysm, right  Admission Dates: 03/13/14 - 03/26/14  Discharge Diagnosis: [place] s/p [facility] x3 and ultimately BMS to pLAD& POBA to CTO of circumflex/marginal vessel on 03/17/14 and staged BMS to mRCA on 03/21/14  HPI: Richard [name] is a 79 y.o. male  with a history of hypothyroidism, HLD, anemia, Wegener's granulomatosis and no prior cardiac disease who presented to [facility] ED on 03/13/14 with chest pain and was transferred to [facility] for cath the following morning.  PMH significant for Wegener's granulomatosis and follows with Dr. [name] for his pulmonary disease (severe multifocal bronchiectasis as well as numerous cavitary and non-cavitary nodules). He had been having chest tightness with significant exertion for the last few months with increasing dyspnea on exertion. Also had 2 brothers who both had CABG. Referred to Dr. Debara Pickett who proceeded with nuclear stress testing which demonstrated a "moderate region of apical and inferior scar with mild superimposed qualitative ischemia". LV wall motion demonstrated a "moderate degree of hypokinesis involving the mid-distal inferior wall and apex with an EF of 40%". Prior echo dated 03/30/2010 showed EF 45-50%.  Hospital Course: He presented to [facility] ED with increasing fatigue  and chest tightness which developed that morning, which resolved within 10 minutes.  He was given ASA, heparin, and nitro paste. ECG demonstrated sinus tachycardia with no acute ST-T abnormalities. POC troponin normal. Also noted to be anemic, Hgb 9 (10 on 12/06/13). Dr. [name] noted that Mr. Ahonen has had hemoptysis in the past, therefore limiting DAPT was ideal and BMS over DES thought to be the best option.  [place]/CAD:  -- Abnormal outpatient nuclear stress test on 03/12/14: Intermediate risk with moderate region of apical and inferior scar with mild superimposed ischemia; EF 40%  -- s/p first LHC on 03/14/14 which revealed severe multivessel CAD involving all 4 major branches. Seen by Dr. Madolyn Frieze on this admission who deemed him unsuitable for CABG due to underlying comorbid conditions. ( sts score >30). It was elected to proceed with staged multivessel PCI .  -- s/p LHC on 03/17/14 with successful PCI to the LAD and POBA to a chronically occluded LCX. There  remained high grade proximal disease to a large proximal Ramus branch as well as mid-RCA disease so staged PCI was planned at a later date due to pulmonary issues and renal dysfunction. This was further complicated by right femoral artery pseudoaneurysm requiring repair. LHC on 03/21/14 s/p PCI with rotablator atherectomy with BMS to the mid RCA. Due to length of fluoro time and large volume of contrast, it was decided to medically manage the ramus stenosis which Dr. Angelena Form felt was moderately stenosed and if he has recurrent anginal then consider PCI.  -- Continue ASA/BB/Plavix/statin  Right femoral pseudoaneurysm: s/p suture repair of femoral artery with evacuation of hematoma on 03/19/14. Still with significant hematoma down buttox and hamstring but improving. CBC stable at 7.7.  -- Has follow up with Dr. Kellie Simmering on 04/08/2014  Anemia: subsequent to right femoral pseudoaneurysm- s/p surgical repair. S/p blood transfusion x 2. Hgb improved from 7.4 to 8.6. Stable at 7.7 on discharge.  --Will send home on Iron 324 mg daily with colace 100 mg BID to help prevent constipation  SOB - BNP elevated at 4937. D-Dimer elevated at 2.6. CTA with no acute PE. No s/s of volume overload  -- Though to be possibly due to anemia. Chest xray showed multiple cavitary lesions but no CHF. CTA with multiple abnormalities possibly reflecting acute bronchitis vs organizing PNA. PCCM (followed by Dr. [name]) consulted who felt that the CT was at his baseline and to continue current therapy. He thought his SOB was likely multifactorial and anemia also playing a role.  -- BNP elevated at 4937. D-Dimer elevated at 2.6. CTA with no acute PE. No s/s of volume overload  Chronic systolic CHF- 2 D ECHO with LVEF 40-45%, mid to distal inferoapical and apical hypokinesis, diastolic dysfunction with elevated LV filling pressure, mild to moderate RV hypokinesis.  Wegener's granulomatosis. ( per PCCM)  -- Continue cytoxan, prednisone  --  Bactrim for PCP prophylaxis  -- Will need pulmonary rehab as an outpatient  Chronic renal insufficiency, stage III (moderate).  -- Creat elevated but stable after contrast exposure for CTA  -- Creat stable at 1.48 today. Continue to monitor  The patient has had a prolonged hospital course but is recovering well. The femoral catheter site is stable. He has been seen by Dr. Irish Lack today and deemed ready for discharge home. All follow-up appointments have been scheduled. Discharge medications are listed below.     04/11/2014 ext post hosp  f/u ov/Richard [name] re: bronchiectasis / angina resolved p stent Chief Complaint  Patient presents with  . Follow-up    couging yellowish   Not using vest yet due to concerns with stent and plans on talking to cards first. cipro best recent rx for purulent sputum but doesn't have a prn on hand  maint on pred 10 mg per day and cytoxan 50  rec Cipro 500 mg twice daily x 10 days as needed for nasty mucus    05/23/2014 f/u ov/Richard [name] re: WG on cytoxan 50/ pred 10 with CRI f/u by nephrology  Chief Complaint  Patient presents with  . Follow-up    Wegener's granulomatosis; SOB, cough, spitting up yellow sputum, finished Levaquin 1 week ago  feels levaquin may work a little better than cipro No need for saba Not limited by breathing from desired activities  As by gen weakness  No hemoptysis   rec No change in prednisone or cytoxan dosing Have your kidney doctor send me the results of your labs if not in the [facility] ([facility]) Please remember to go to the  x-ray department downstairs for your tests    08/19/2014 acute  ov/Richard [name] re: ? pna vs flare of bronchiectasis  Chief Complaint  Patient presents with  . Acute Visit    Pt c/o chest congestion that started on 08/16/14- started on levaquin and then next day started having CP.  He states that his breathing seems to be slightly worse than usual for him.   one week acute onset  sense of building up fluid/ congestion in  chest" just like many times before "with change in sputum color ? Red ? Started levaquin 08/16/14 then new pattern of cp 1/11 = moderate/ severe gen bilateral  Ant worse with deep breathing or  coughing or exp to cold air   Did not try xopenex or ntg/ stopped levaquin and pain resolved Pain was like a tightness s  radiation or assoc sweathing nausea or vomiting  '  No obvious day to day or daytime variability  subjective wheeze overt   hb symptoms. No unusual exp hx or h/o childhood pna/ asthma or knowledge of premature birth.  Sleeping ok without nocturnal  or early am exacerbation  of respiratory  c/o's or need for noct saba. Also denies any obvious fluctuation of symptoms with weather or environmental changes or other aggravating or alleviating factors except as outlined above   Current Medications, Allergies, Complete Past Medical History, Past Surgical History, Family History, and Social History were reviewed in [employer] record.  ROS  The following are not active complaints unless bolded sore throat, dysphagia, dental problems, itching, sneezing,  nasal congestion or excess/ purulent secretions, ear ache,   fever, chills, sweats, unintended wt loss,clasicallly lateralizing  pleuritic or exertional cp, hemoptysis,  orthopnea pnd or leg swelling, presyncope, palpitations, heartburn, abdominal pain, anorexia, nausea, vomiting, diarrhea  or change in bowel or urinary habits, change in stools or urine, dysuria,hematuria,  rash, arthralgias, visual complaints, headache, numbness weakness or ataxia or problems with walking or coordination,  change in mood/affect or memory.               Objective:   Physical Exam  wt   128  04/26/11 >07/08/2011  134> 134 11/28/2011 > 02/29/2012 136> 05/31/2012  136>  134 11/22/12 > 07/26/2013  136 > 08/21/2013  136 >133 09/06/2013 >  09/12/2013  133 > 132 09/27/2013 > 10/15/2013 131 > 11/07/2013  128 > 01/09/14 124 > 02/12/2014  123 > 04/11/2014   117 >  05/23/2014 119 > 08/19/2014 110   amb thin pleasant wm nad  SKIN: no rash, lesions  NODES: no lymphadenopathy  HEENT: [place]/AT, EOM- WNL, Conjuctivae- clear, PERRLA, TM-WNL, Nose- clear, Throat- clear and wnl, Mallampati III  NECK: Supple w/ fair ROM, JVD- none, normal carotid impulses w/o bruits Thyroid-  CHEST: min bilateral exp rhonchi  No resp distress HEART: RRR, no m/g/r heard  ABDOMEN: Soft and nl EXT:  No deformities or restrucitons     CXR PA and Lateral:   08/19/2014 :     I personally reviewed images and agree with radiology impression as follows:   1. Chronic fibrotic change with hyper aeration. No definite active process. 2. Moderate size hiatal hernia.       Assessment & Plan:

## 2014-08-20 ENCOUNTER — Ambulatory Visit: Payer: Medicare Other | Admitting: Internal Medicine

## 2014-08-20 ENCOUNTER — Encounter: Payer: Self-pay | Admitting: Internal Medicine

## 2014-08-20 DIAGNOSIS — K219 Gastro-esophageal reflux disease without esophagitis: Secondary | ICD-10-CM | POA: Insufficient documentation

## 2014-08-20 NOTE — Assessment & Plan Note (Signed)
Note has large HH and I suspect his upper chest discomfort, which has resolved, was related to this with reflux p levaquin but always difficult to sort out with IHD in ddx so rec rx gerd and keep the ntg on hand for prn use    Each maintenance medication was reviewed in detail including most importantly the difference between maintenance and as needed and under what circumstances the prns are to be used.  Please see instructions for details which were reviewed in writing and the patient given a copy.

## 2014-08-20 NOTE — Progress Notes (Signed)
Quick Note:  Spoke with pt and notified of results per Dr. [name]. Pt verbalized understanding and denied any questions.  ______ 

## 2014-08-20 NOTE — Assessment & Plan Note (Signed)
No evidence of angina/ chf at present > continue present rx

## 2014-08-20 NOTE — Assessment & Plan Note (Addendum)
 -     PFT's 06/21/11   FEV1  1.60 (61%) ratio 48 and 10% better p B2,  DLCO 83%  - CT 10/04/13 severe multifocal bronchiectasis as well as numerous cavitary and      non-cavitary nodules  -   PFT's 11/22/2012 FEV1  1.52 (62%) arnd 46 and no change p B2 DLCO 89%  -    VEST rec 02/12/2014 > reports improvement 05/23/14 >  No loner sure helping as of 08/19/14   The proper method of use, as well as anticipated side effects, of a metered-dose inhaler are discussed and demonstrated to the patient. Improved effectiveness after extensive coaching during this visit to a level of approximately  90%    I had an extended discussion with the patient reviewing all relevant studies completed to date and  lasting 15 to 20 minutes of a 25 minute visit on the following ongoing concerns:   The key to the hx is "this is the same thing I've had before"  Reminded him that we had previously attempted to give him and wife an action plan for this problem that he can activate at home on his own but he resisted our help but now if he wants me to help him he'll have to meet [name] half way   We'll treat his mod/ severe exac today with augmentin and double the pred (doubt his chest discomfort was the levaquin/ more likely related to gerd see sep a/p)   To keep things simple, I have asked the patient to first separate medicines that are perceived as maintenance, that is to be taken daily "no matter what", from those medicines that are taken on only on an as-needed basis and I have given the patient examples of both, and then return to see our NP to generate a  detailed  medication calendar which should be followed until the next physician sees the patient and updates it.

## 2014-08-21 ENCOUNTER — Telehealth: Payer: Self-pay | Admitting: Internal Medicine

## 2014-08-21 MED ORDER — LEVALBUTEROL TARTRATE 45 MCG/ACT IN AERO: 1.0000 | INHALATION_SPRAY | RESPIRATORY_TRACT | Status: DC | PRN

## 2014-08-21 NOTE — Telephone Encounter (Signed)
Called spoke with pt. He wanted to know if we have any xopenx samples. I advised we do not will send to pharmacy. Nothing further needed

## 2014-08-26 ENCOUNTER — Encounter (HOSPITAL_COMMUNITY)
Admission: RE | Admit: 2014-08-26 | Discharge: 2014-08-26 | Disposition: A | Discharge status: Home / Self Care | Payer: Medicare Other | Source: Ambulatory Visit | Attending: Nephrology | Admitting: Nephrology

## 2014-08-26 ENCOUNTER — Ambulatory Visit: Payer: Medicare Other | Admitting: Internal Medicine

## 2014-08-26 DIAGNOSIS — D631 Anemia in chronic kidney disease: Secondary | ICD-10-CM | POA: Insufficient documentation

## 2014-08-26 DIAGNOSIS — N184 Chronic kidney disease, stage 4 (severe): Secondary | ICD-10-CM | POA: Diagnosis not present

## 2014-08-26 LAB — FERRITIN: Ferritin: 792 ng/mL — ABNORMAL HIGH (ref 22–322)

## 2014-08-26 LAB — IRON AND TIBC
Iron: 99 ug/dL (ref 42–165)
Saturation Ratios: 61 % — ABNORMAL HIGH (ref 20–55)
TIBC: 162 ug/dL — ABNORMAL LOW (ref 215–435)
UIBC: 63 ug/dL — ABNORMAL LOW (ref 125–400)

## 2014-08-26 MED ORDER — EPOETIN ALFA 20000 UNIT/ML IJ SOLN
INTRAMUSCULAR | Status: AC
  Administered 2014-08-26: 20000 [IU]
  Filled 2014-08-26: qty 1

## 2014-08-26 MED ORDER — EPOETIN ALFA 10000 UNIT/ML IJ SOLN
INTRAMUSCULAR | Status: AC
  Administered 2014-08-26: 10000 [IU]
  Filled 2014-08-26: qty 1

## 2014-08-26 MED ORDER — EPOETIN ALFA 40000 UNIT/ML IJ SOLN: 30000.0000 [IU] | INTRAMUSCULAR | Status: DC

## 2014-08-27 LAB — POCT HEMOGLOBIN-HEMACUE: HEMOGLOBIN: 11.3 g/dL — AB (ref 13.0–17.0)

## 2014-09-02 ENCOUNTER — Telehealth: Payer: Self-pay | Admitting: Gastroenterology

## 2014-09-02 ENCOUNTER — Ambulatory Visit (INDEPENDENT_AMBULATORY_CARE_PROVIDER_SITE_OTHER): Payer: Medicare Other | Admitting: Adult Health

## 2014-09-02 ENCOUNTER — Encounter: Payer: Self-pay | Admitting: Adult Health

## 2014-09-02 ENCOUNTER — Encounter: Payer: Medicare Other | Admitting: Adult Health

## 2014-09-02 ENCOUNTER — Encounter (HOSPITAL_COMMUNITY)
Admission: RE | Admit: 2014-09-02 | Discharge: 2014-09-02 | Disposition: A | Discharge status: Home / Self Care | Payer: Medicare Other | Source: Ambulatory Visit | Attending: Nephrology | Admitting: Nephrology

## 2014-09-02 VITALS — BP 104/64 | HR 83 | Temp 98.1°F | Ht 67.0 in | Wt 116.2 lb

## 2014-09-02 DIAGNOSIS — M3130 Wegener's granulomatosis without renal involvement: Secondary | ICD-10-CM

## 2014-09-02 DIAGNOSIS — D631 Anemia in chronic kidney disease: Secondary | ICD-10-CM | POA: Diagnosis not present

## 2014-09-02 DIAGNOSIS — J471 Bronchiectasis with (acute) exacerbation: Secondary | ICD-10-CM

## 2014-09-02 LAB — POCT HEMOGLOBIN-HEMACUE: HEMOGLOBIN: 10.8 g/dL — AB (ref 13.0–17.0)

## 2014-09-02 MED ORDER — EPOETIN ALFA 10000 UNIT/ML IJ SOLN
INTRAMUSCULAR | Status: AC
  Administered 2014-09-02: 10000 [IU] via SUBCUTANEOUS
  Filled 2014-09-02: qty 1

## 2014-09-02 MED ORDER — EPOETIN ALFA 40000 UNIT/ML IJ SOLN: 30000.0000 [IU] | INTRAMUSCULAR | Status: DC

## 2014-09-02 MED ORDER — EPOETIN ALFA 20000 UNIT/ML IJ SOLN
INTRAMUSCULAR | Status: AC
  Administered 2014-09-02: 20000 [IU] via SUBCUTANEOUS
  Filled 2014-09-02: qty 1

## 2014-09-02 NOTE — Progress Notes (Signed)
Subjective:    Patient ID: Richard [name], male    DOB: 07-24-1936     MRN: [id number]   Brief patient profile:  31  yowm never smoker with dx of Longstanding bronchiectasis then dx with WG 03/2010 > requested establish with [name].   Admit [facility] dx WG by pos c anca 03/2010, supportive tbbx but not vats   Admit [name] dx spont L Ptx 9/08/15/09 requiring Chest tube but no surgery   05/24/10--NP ov/ med reivew. Since last visit. has been hospitalized 05/12/2010-- 10/10/2011for Right pneumothorax., with underlying Wegener granulomatosis. and . Bronchiectasis. Pt was found to have Right Pneumo. Chest tube was placed. with improvement and removed 10/7. Xray on 10/10 w/ resolved pnuemo. He was continued on cytoxan and bactrim for PCP prophylaxis. rec. Prednisone 20 mg reduce to one half daily  Aspirin 81 mg one daily with breakfast but stop for any bleeding  See Patient Care Coordinator before leaving for cardiology appt > saw [name], ? LHC needed       11/23/2012 f/u ov/[name] re WG/ bronchiectasis Chief Complaint  Patient presents with  . Follow-up    Had PFT today-sob same,cough-yellow,wheezing occass.,  no hemoptysis, no real limiting sob though sedentary, no arthralgias or rash maintaining 2.5 mg daily No change prednisone 5 mg one half daily  > ok to try every other day if doing great May 1 flare rx levaquin and pred May 16 lap [facility]ole [facility]     08/19/2013 f/u ov/[name] re: ? Recurrent WG Chief Complaint  Patient presents with  . Follow-up    Breathing is unchanged. Cough unchanged-prod cpugh w/ yellow phlem. On 2nd dose of cipro  now on day 5/10 cipro and less dark, never bloody, no sinus complaints, arthritis rash or fever or hematuria. >ANCA 1:80 titer, started on cytoxan        09/27/2013 f/u ov/[name] re:  WG on pred 10 and ctx 50 mg daily  Chief Complaint  Patient presents with  . Follow-up    Pt states cough and SOB seems worse since the last visit. Still producing yellow  sputum. He states has low grade temp every am.   assoc with nasal congestion but no epistaxis or hemoptysis  rec Stop Zebeta Schedule sinus CT> 2/09/14/13 Mild to moderate mucosal thickening in the paranasal sinuses without  air-fluid level. For cough use the mucinex dm up to 1200 mg every 12 hours and as much flutter valve as possible For breathing use xopenex up to 2 puffs every 4 hours if needed and don't leave home wihout it   Admit date: 10/04/2013  Discharge date: 10/07/2013  Discharge Diagnoses:  SOB (shortness of breath)  Wegener's granulomatosis  Bronchiectasis with acute exacerbation  SVT (supraventricular tachycardia)  Cough  Weakness generalized  Discharge Condition: Stable and improved  Filed Weights    10/04/13 1539   Weight:  58.423 kg (128 lb 12.8 oz)   History of present illness:  Patient is a pleasant 80 year old white man with a past medical history significant for Wegener's granulomatosis diagnosed in 2011 was on Cytoxan until 2013. He follows with Dr. [name]. He is maintained on prednisone 10 mg daily. He had been feeling well until mid January at which point a chest x-ray showed increased nodules and Dr. [name] decided to restart Cytoxan. Patient came into the hospital with the above-mentioned complaints. His heart rate was noted to be in the 180s in the rhythm of SVT. He converted to sinus rhythm spontaneously. CT scan of  the chest shows bronchiectasis as well as numerous cavitary and non-cavitary nodules. We have been asked to admit him for further evaluation and management. In addition to his cough and mild shortness of breath, he endorses low-grade temperatures for about 2 weeks as well as increasing sinus symptoms.  Hospital Course:  Wegener's Granulomatosis/Bronchiectasis with Acute Exacerbation  -Cx data remains negative to date.  -As discussed with Dr. [name], will transition antibiotics to cipro for 3 more days, continue cytoxan (bactrim while on cytoxan for PCP  prophylaxis), and slow steroid taper until seen again by Dr. [name].  SVT  -Spontaneously converted to NSR.          12/06/13 to ER with dehyrdration   01/09/2014 f/u ov/[name] re:  Chief Complaint  Patient presents with  . Follow-up    Pt c/o non prod cough and states that his breathing is no better. He c/o poor appetite.   rec If mucus gets nasty,  Levaquin 750 x 7 days If mucus gets thick >  mucinex dm 1200 mg every 12 h and use the flutter valve   02/12/2014 f/u ov/[name] re: bronchiectasis / pred 10 / cyt 60 Chief Complaint  Patient presents with  . Follow-up    Pt states that his symptoms are unchanged since last visit. Finished round of levaquin x 2 days ago.  No new co's today.   ok at [employer] leaning on the cart  New occ chest discomfort when sob x sev months, goes away at rest. No assoc diaph, nausea, radiation Has never tried xopenex for this symptom to date, encouraged to do so rec Please see patient coordinator before you leave today  to schedule VEST Tdap today      Patient ID: ATTILA [name]  MRN: [id number], DOB/AGE: November 18, 1935 79 y.o.  Admit date: 03/13/2014  D/C date: 03/26/2014   Primary Cardiologist: Dr. Debara Pickett  Principal Problem:  Unstable Class III Angina  Active Problems:  HYPOTHYROIDISM  HYPERLIPIDEMIA  ANEMIA  Wegener's granulomatosis  Chronic respiratory failure  Abnormal nuclear stress test: Intermediate risk with moderate region of apical and inferior scar with mild superimposed ischemia; EF 40%  CAD- severe 4 V CAD- not CABG candidate  Anemia- transfused 03/16/14  Chronic renal insufficiency, stage III (moderate)  Cardiomyopathy, ischemic- EF 40% by Myoview  Acute blood loss anemia  Femoral artery pseudo-aneurysm, right  Admission Dates: 03/13/14 - 03/26/14  Discharge Diagnosis: [place] s/p [facility] x3 and ultimately BMS to pLAD& POBA to CTO of circumflex/marginal vessel on 03/17/14 and staged BMS to mRCA on 03/21/14  HPI: Richard [name] is a 79 y.o. male  with a history of hypothyroidism, HLD, anemia, Wegener's granulomatosis and no prior cardiac disease who presented to [facility] ED on 03/13/14 with chest pain and was transferred to [facility] for cath the following morning.  PMH significant for Wegener's granulomatosis and follows with Dr. [name] for his pulmonary disease (severe multifocal bronchiectasis as well as numerous cavitary and non-cavitary nodules). He had been having chest tightness with significant exertion for the last few months with increasing dyspnea on exertion. Also had 2 brothers who both had CABG. Referred to Dr. Debara Pickett who proceeded with nuclear stress testing which demonstrated a "moderate region of apical and inferior scar with mild superimposed qualitative ischemia". LV wall motion demonstrated a "moderate degree of hypokinesis involving the mid-distal inferior wall and apex with an EF of 40%". Prior echo dated 03/30/2010 showed EF 45-50%.  Hospital Course: He presented to [facility] ED with increasing fatigue  and chest tightness which developed that morning, which resolved within 10 minutes.  He was given ASA, heparin, and nitro paste. ECG demonstrated sinus tachycardia with no acute ST-T abnormalities. POC troponin normal. Also noted to be anemic, Hgb 9 (10 on 12/06/13). Dr. [name] noted that Mr. Mayhall has had hemoptysis in the past, therefore limiting DAPT was ideal and BMS over DES thought to be the best option.  [place]/CAD:  -- Abnormal outpatient nuclear stress test on 03/12/14: Intermediate risk with moderate region of apical and inferior scar with mild superimposed ischemia; EF 40%  -- s/p first LHC on 03/14/14 which revealed severe multivessel CAD involving all 4 major branches. Seen by Dr. Madolyn Frieze on this admission who deemed him unsuitable for CABG due to underlying comorbid conditions. ( sts score >30). It was elected to proceed with staged multivessel PCI .  -- s/p LHC on 03/17/14 with successful PCI to the LAD and POBA to a chronically occluded LCX. There  remained high grade proximal disease to a large proximal Ramus branch as well as mid-RCA disease so staged PCI was planned at a later date due to pulmonary issues and renal dysfunction. This was further complicated by right femoral artery pseudoaneurysm requiring repair. LHC on 03/21/14 s/p PCI with rotablator atherectomy with BMS to the mid RCA. Due to length of fluoro time and large volume of contrast, it was decided to medically manage the ramus stenosis which Dr. Angelena Form felt was moderately stenosed and if he has recurrent anginal then consider PCI.  -- Continue ASA/BB/Plavix/statin  Right femoral pseudoaneurysm: s/p suture repair of femoral artery with evacuation of hematoma on 03/19/14. Still with significant hematoma down buttox and hamstring but improving. CBC stable at 7.7.  -- Has follow up with Dr. Kellie Simmering on 04/08/2014  Anemia: subsequent to right femoral pseudoaneurysm- s/p surgical repair. S/p blood transfusion x 2. Hgb improved from 7.4 to 8.6. Stable at 7.7 on discharge.  --Will send home on Iron 324 mg daily with colace 100 mg BID to help prevent constipation  SOB - BNP elevated at 4937. D-Dimer elevated at 2.6. CTA with no acute PE. No s/s of volume overload  -- Though to be possibly due to anemia. Chest xray showed multiple cavitary lesions but no CHF. CTA with multiple abnormalities possibly reflecting acute bronchitis vs organizing PNA. PCCM (followed by Dr. [name]) consulted who felt that the CT was at his baseline and to continue current therapy. He thought his SOB was likely multifactorial and anemia also playing a role.  -- BNP elevated at 4937. D-Dimer elevated at 2.6. CTA with no acute PE. No s/s of volume overload  Chronic systolic CHF- 2 D ECHO with LVEF 40-45%, mid to distal inferoapical and apical hypokinesis, diastolic dysfunction with elevated LV filling pressure, mild to moderate RV hypokinesis.  Wegener's granulomatosis. ( per PCCM)  -- Continue cytoxan, prednisone  --  Bactrim for PCP prophylaxis  -- Will need pulmonary rehab as an outpatient  Chronic renal insufficiency, stage III (moderate).  -- Creat elevated but stable after contrast exposure for CTA  -- Creat stable at 1.48 today. Continue to monitor  The patient has had a prolonged hospital course but is recovering well. The femoral catheter site is stable. He has been seen by Dr. Irish Lack today and deemed ready for discharge home. All follow-up appointments have been scheduled. Discharge medications are listed below.     04/11/2014 ext post hosp  f/u ov/[name] re: bronchiectasis / angina resolved p stent Chief Complaint  Patient presents with  . Follow-up    couging yellowish   Not using vest yet due to concerns with stent and plans on talking to cards first. cipro best recent rx for purulent sputum but doesn't have a prn on hand  maint on pred 10 mg per day and cytoxan 50  rec Cipro 500 mg twice daily x 10 days as needed for nasty mucus    05/23/2014 f/u ov/[name] re: WG on cytoxan 50/ pred 10 with CRI f/u by nephrology  Chief Complaint  Patient presents with  . Follow-up    Wegener's granulomatosis; SOB, cough, spitting up yellow sputum, finished Levaquin 1 week ago  feels levaquin may work a little better than cipro No need for saba Not limited by breathing from desired activities  As by gen weakness  No hemoptysis   rec No change in prednisone or cytoxan dosing Have your kidney doctor send me the results of your labs if not in the [facility] ([facility]) Please remember to go to the  x-ray department downstairs for your tests    08/19/2014 acute  ov/[name] re: ? pna vs flare of bronchiectasis  Chief Complaint  Patient presents with  . Acute Visit    Pt c/o chest congestion that started on 08/16/14- started on levaquin and then next day started having CP.  He states that his breathing seems to be slightly worse than usual for him.   one week acute onset  sense of building up fluid/ congestion in  chest" just like many times before "with change in sputum color ? Red ? Started levaquin 08/16/14 then new pattern of cp 1/11 = moderate/ severe gen bilateral  Ant worse with deep breathing or  coughing or exp to cold air   Did not try xopenex or ntg/ stopped levaquin and pain resolved Pain was like a tightness s  radiation or assoc sweathing nausea or vomiting  >Augmentin and pred burst   09/02/2014 Follow up : Bronchiectasis Patient returns for a two-week follow-up. Last visit. Patient with a bronchiectatic exacerbation. He was treated with Augmentin 10 days and a prednisone burst. Patient has started to slowly feel improved with decreased discolored mucus. Shortness of breath is not quite as bad. He has made very slow progress. He denies any hemoptysis, orthopnea, PND, leg swelling or calf pain. Appetite is fair. No nausea, vomiting or diarrhea. Chest x-ray last visit showed chronic changes.    Current Medications, Allergies, Complete Past Medical History, Past Surgical History, Family History, and Social History were reviewed in [employer] record.  ROS  The following are not active complaints unless bolded sore throat, dysphagia, dental problems, itching, sneezing,  nasal congestion or excess/ purulent secretions, ear ache,   fever, chills, sweats, unintended wt loss,clasicallly lateralizing  pleuritic or exertional cp, hemoptysis,  orthopnea pnd or leg swelling, presyncope, palpitations, heartburn, abdominal pain, anorexia, nausea, vomiting, diarrhea  or change in bowel or urinary habits, change in stools or urine, dysuria,hematuria,  rash, arthralgias, visual complaints, headache, numbness weakness or ataxia or problems with walking or coordination,  change in mood/affect or memory.               Objective:   Physical Exam  wt   128  04/26/11 >07/08/2011  134> 134 11/28/2011 > 02/29/2012 136> 05/31/2012  136>  134 11/22/12 > 07/26/2013  136 > 08/21/2013  136 >133  09/06/2013 >  09/12/2013  133 > 132 09/27/2013 > 10/15/2013 131 > 11/07/2013  128 >  01/09/14 124 > 02/12/2014  123 > 04/11/2014   117 > 05/23/2014 119 > 08/19/2014 110   amb thin pleasant wm nad  SKIN: no rash, lesions  NODES: no lymphadenopathy  HEENT: [place]/AT, EOM- WNL, Conjuctivae- clear, PERRLA, TM-WNL, Nose- clear, Throat- clear and wnl, Mallampati III  NECK: Supple w/ fair ROM, JVD- none, normal carotid impulses w/o bruits Thyroid-  CHEST: min bilateral exp rhonchi  No resp distress HEART: RRR, no m/g/r heard  ABDOMEN: Soft and nl EXT:  No deformities or restrucitons     CXR PA and Lateral:   08/19/2014 :      1. Chronic fibrotic change with hyper aeration. No definite active process. 2. Moderate size hiatal hernia.       Assessment & Plan:

## 2014-09-02 NOTE — Assessment & Plan Note (Signed)
Recent exacerbation, now slowly resolving Patient is encouraged on vest therapy and flutter valve use  Plan  Continue on current regimen  Continue with VEST therapy.  Follow up Dr. [name]  In 2 months and As needed

## 2014-09-02 NOTE — Assessment & Plan Note (Signed)
Continue on current regimen  follow up as planned and As needed

## 2014-09-02 NOTE — Telephone Encounter (Signed)
Patient will come in and see Dr. Fuller Plan on 09/24/14 10:45.  His nephrologist instructed to come off of Nexium and start on pepcid.  Change was made due to kidney function.  He will discuss at the next office visit

## 2014-09-02 NOTE — Patient Instructions (Signed)
Continue on current regimen  Continue with VEST therapy.  Follow up Dr. [name]  In 2 months and As needed

## 2014-09-04 ENCOUNTER — Telehealth: Payer: Self-pay

## 2014-09-04 NOTE — Telephone Encounter (Signed)
Spoke with Almyra Free at [facility] to initiate a formulary exception for pt's Xopenex HFA.  We should receive a verdict in 24 hours.  Forwarding to [place] to look out for.

## 2014-09-05 NOTE — Telephone Encounter (Signed)
Will send to Dr [name] as Juluis Rainier - please advise on alternative. Thanks

## 2014-09-05 NOTE — Telephone Encounter (Signed)
Ok to supstitute albuterol and use half the dose if gets too jittery and it's basically the same thing as the xopenex

## 2014-09-05 NOTE — Telephone Encounter (Signed)
[employer] [place] calling back about a denial of the xopenex they are mailing a letter [phone]

## 2014-09-05 NOTE — Telephone Encounter (Signed)
LM for patient to return call regarding a medication  Need to discuss denial of coverage for Xop and giving alternative of albuterol. Explain dose and verify pharmacy to send rx to.

## 2014-09-08 NOTE — Telephone Encounter (Signed)
[phone] returning call

## 2014-09-08 NOTE — Telephone Encounter (Signed)
lmtcb x1 

## 2014-09-08 NOTE — Telephone Encounter (Signed)
Pt is aware of medication switch. Nothing further was needed.

## 2014-09-09 ENCOUNTER — Encounter (HOSPITAL_COMMUNITY)
Admission: RE | Admit: 2014-09-09 | Discharge: 2014-09-09 | Disposition: A | Discharge status: Home / Self Care | Payer: Medicare Other | Source: Ambulatory Visit | Attending: Nephrology | Admitting: Nephrology

## 2014-09-09 DIAGNOSIS — D631 Anemia in chronic kidney disease: Secondary | ICD-10-CM | POA: Diagnosis present

## 2014-09-09 DIAGNOSIS — N184 Chronic kidney disease, stage 4 (severe): Secondary | ICD-10-CM | POA: Insufficient documentation

## 2014-09-09 LAB — POCT HEMOGLOBIN-HEMACUE: Hemoglobin: 10.4 g/dL — ABNORMAL LOW (ref 13.0–17.0)

## 2014-09-09 MED ORDER — EPOETIN ALFA 10000 UNIT/ML IJ SOLN
INTRAMUSCULAR | Status: AC
  Administered 2014-09-09: 10000 [IU] via SUBCUTANEOUS
  Filled 2014-09-09: qty 1

## 2014-09-09 MED ORDER — EPOETIN ALFA 40000 UNIT/ML IJ SOLN: 30000.0000 [IU] | INTRAMUSCULAR | Status: DC

## 2014-09-09 MED ORDER — EPOETIN ALFA 20000 UNIT/ML IJ SOLN
INTRAMUSCULAR | Status: AC
  Administered 2014-09-09: 20000 [IU] via SUBCUTANEOUS
  Filled 2014-09-09: qty 1

## 2014-09-11 NOTE — Assessment & Plan Note (Signed)
Duplicate note

## 2014-09-16 ENCOUNTER — Encounter (HOSPITAL_COMMUNITY)
Admission: RE | Admit: 2014-09-16 | Discharge: 2014-09-16 | Disposition: A | Discharge status: Home / Self Care | Payer: Medicare Other | Source: Ambulatory Visit | Attending: Nephrology | Admitting: Nephrology

## 2014-09-16 DIAGNOSIS — D631 Anemia in chronic kidney disease: Secondary | ICD-10-CM | POA: Diagnosis not present

## 2014-09-16 LAB — POCT HEMOGLOBIN-HEMACUE: HEMOGLOBIN: 10.7 g/dL — AB (ref 13.0–17.0)

## 2014-09-16 MED ORDER — EPOETIN ALFA 20000 UNIT/ML IJ SOLN
INTRAMUSCULAR | Status: AC
  Administered 2014-09-16: 20000 [IU] via SUBCUTANEOUS
  Filled 2014-09-16: qty 1

## 2014-09-16 MED ORDER — EPOETIN ALFA 40000 UNIT/ML IJ SOLN: 30000.0000 [IU] | INTRAMUSCULAR | Status: DC

## 2014-09-16 MED ORDER — EPOETIN ALFA 10000 UNIT/ML IJ SOLN
INTRAMUSCULAR | Status: AC
  Administered 2014-09-16: 10000 [IU] via SUBCUTANEOUS
  Filled 2014-09-16: qty 1

## 2014-09-23 ENCOUNTER — Encounter (HOSPITAL_COMMUNITY)
Admission: RE | Admit: 2014-09-23 | Discharge: 2014-09-23 | Disposition: A | Discharge status: Home / Self Care | Payer: Medicare Other | Source: Ambulatory Visit | Attending: Nephrology | Admitting: Nephrology

## 2014-09-23 DIAGNOSIS — D631 Anemia in chronic kidney disease: Secondary | ICD-10-CM | POA: Diagnosis not present

## 2014-09-23 LAB — FERRITIN: FERRITIN: 418 ng/mL — AB (ref 22–322)

## 2014-09-23 LAB — POCT HEMOGLOBIN-HEMACUE: Hemoglobin: 11.3 g/dL — ABNORMAL LOW (ref 13.0–17.0)

## 2014-09-23 MED ORDER — EPOETIN ALFA 40000 UNIT/ML IJ SOLN: 30000.0000 [IU] | INTRAMUSCULAR | Status: DC

## 2014-09-24 ENCOUNTER — Encounter: Payer: Self-pay | Admitting: Gastroenterology

## 2014-09-24 ENCOUNTER — Ambulatory Visit (INDEPENDENT_AMBULATORY_CARE_PROVIDER_SITE_OTHER): Payer: Medicare Other | Admitting: Gastroenterology

## 2014-09-24 VITALS — BP 94/60 | HR 76 | Ht 67.0 in | Wt 118.0 lb

## 2014-09-24 DIAGNOSIS — Z86010 Personal history of colonic polyps: Secondary | ICD-10-CM

## 2014-09-24 DIAGNOSIS — K219 Gastro-esophageal reflux disease without esophagitis: Secondary | ICD-10-CM

## 2014-09-24 DIAGNOSIS — R1314 Dysphagia, pharyngoesophageal phase: Secondary | ICD-10-CM

## 2014-09-24 DIAGNOSIS — R634 Abnormal weight loss: Secondary | ICD-10-CM

## 2014-09-24 LAB — IRON AND TIBC
Iron: 71 ug/dL (ref 42–165)
Saturation Ratios: 38 % (ref 20–55)
TIBC: 185 ug/dL — AB (ref 215–435)
UIBC: 114 ug/dL — ABNORMAL LOW (ref 125–400)

## 2014-09-24 IMAGING — CR DG CHEST 2V
2 series · 2 of 2 positions shown · non-contrast
Comparison: PA and lateral chest 10/04/2013, 11/07/2013 and
12/06/2013. CT chest 10/04/2013.

CLINICAL DATA: Shortness of breath and cough. History of Wegener's
granulomatosis.

EXAM:
CHEST  2 VIEW

[view not recorded (1 of 2)]
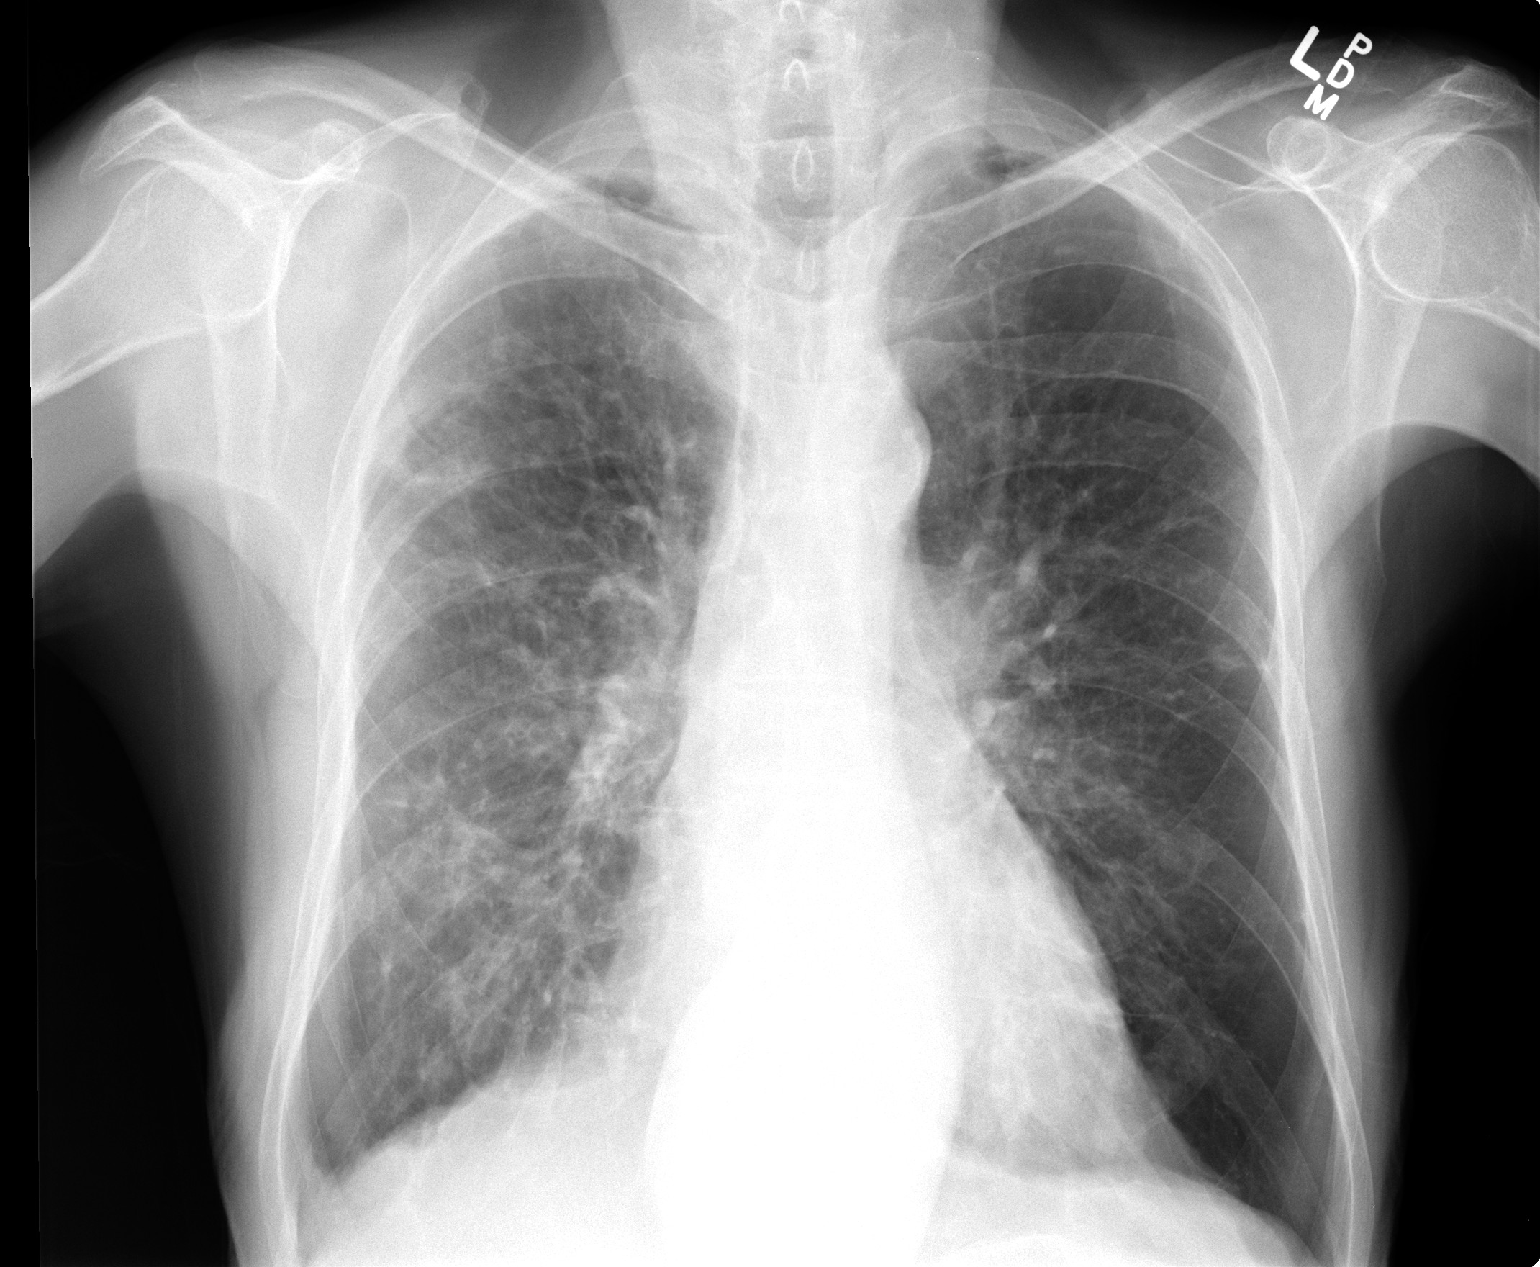

[view not recorded (2 of 2)]
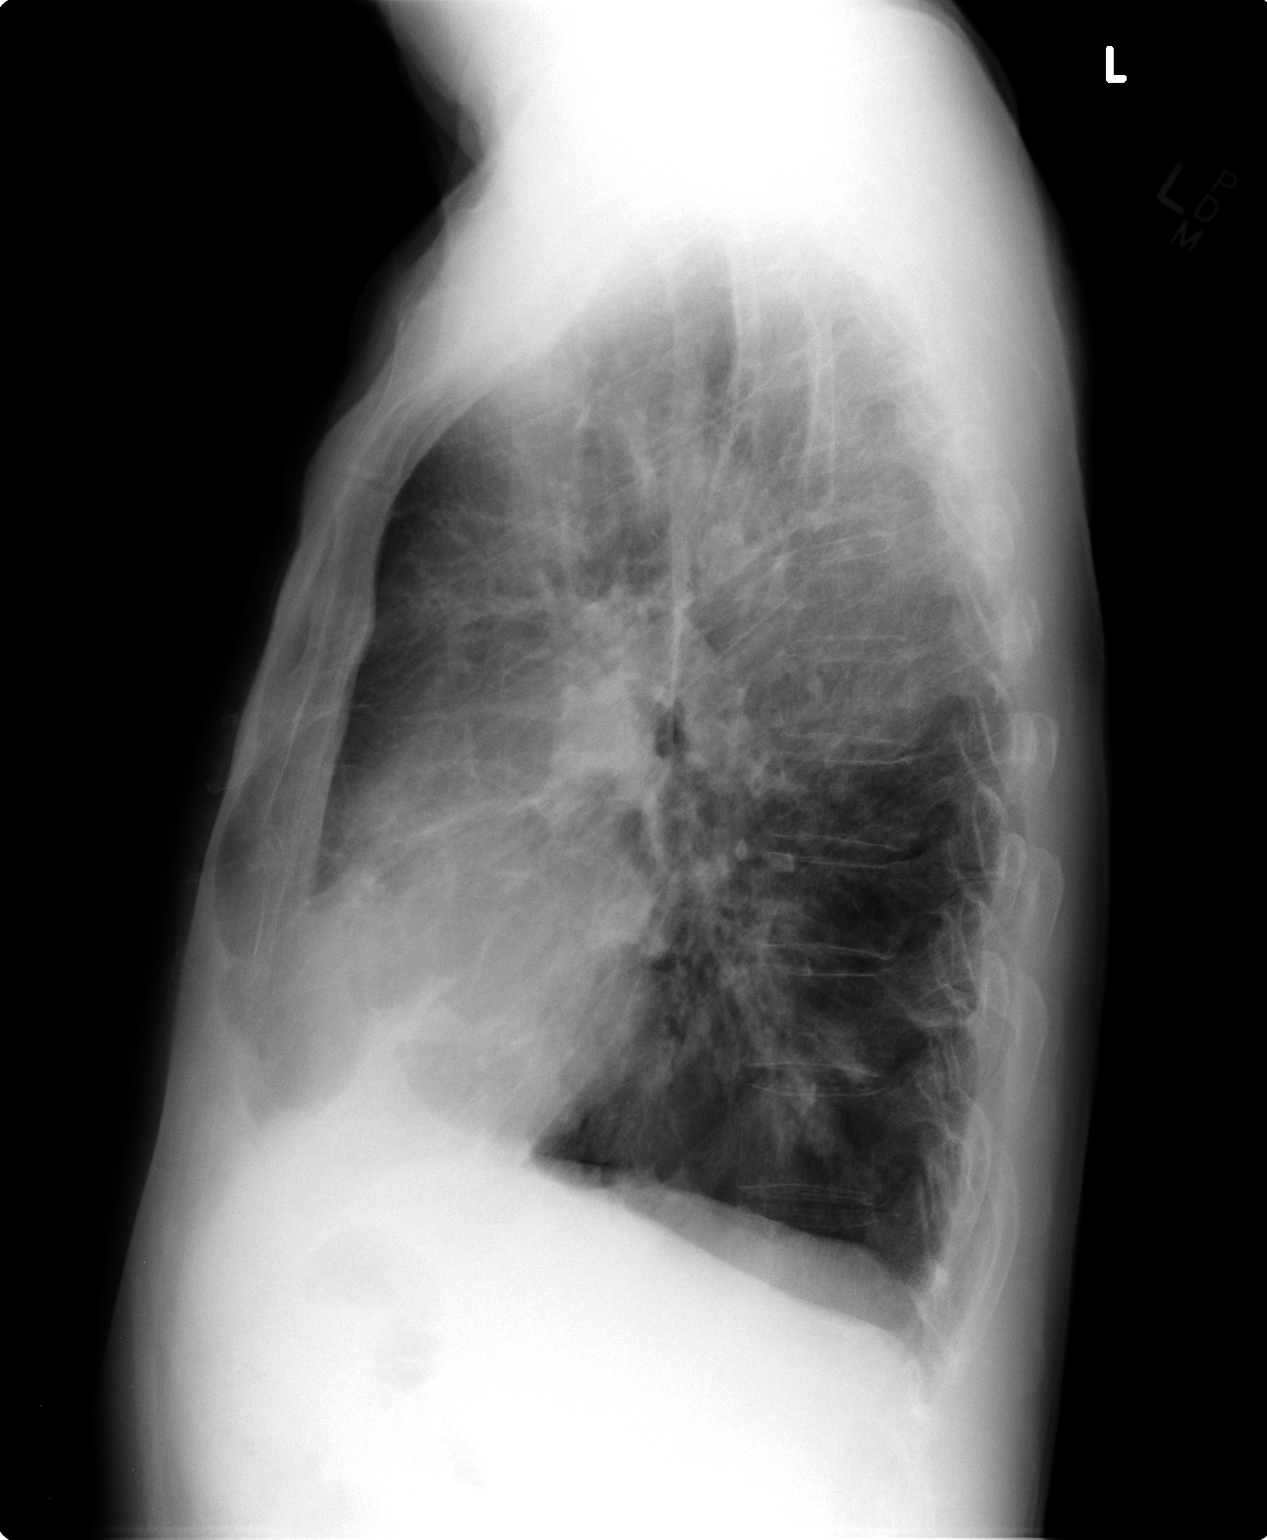

[2 of 2 positions shown; findings below may reference images not displayed]

FINDINGS: Scattered bilateral pulmonary nodules, some of which are cavitary,
are again seen and more numerous on the right. Aeration appears
mildly improved since the most recent study. There is no
pneumothorax. Hiatal hernia is identified. Heart size is normal.
IMPRESSION: Changes compatible with Wegener's granulomatosis as seen on prior
studies. Aeration appears mildly improved since the most recent
plain films.

## 2014-09-24 MED ORDER — EPOETIN ALFA 10000 UNIT/ML IJ SOLN
INTRAMUSCULAR | Status: AC
  Administered 2014-09-23: 10000 [IU] via SUBCUTANEOUS
  Filled 2014-09-24: qty 1

## 2014-09-24 MED ORDER — EPOETIN ALFA 20000 UNIT/ML IJ SOLN
INTRAMUSCULAR | Status: AC
  Administered 2014-09-23: 20000 [IU] via SUBCUTANEOUS
  Filled 2014-09-24: qty 1

## 2014-09-24 NOTE — Patient Instructions (Signed)
You have been scheduled for a Barium Esophogram at [facility] (1st floor of the hospital) on 09/30/14 at 11:30am. Please arrive 15 minutes prior to your appointment for registration. Make certain not to have anything to eat or drink 3 hours prior to your test. If you need to reschedule for any reason, please contact radiology at [phone] to do so. __________________________________________________________________ A barium swallow is an examination that concentrates on views of the esophagus. This tends to be a double contrast exam (barium and two liquids which, when combined, create a gas to distend the wall of the oesophagus) or single contrast (non-ionic iodine based). The study is usually tailored to your symptoms so a good history is essential. Attention is paid during the study to the form, structure and configuration of the esophagus, looking for functional disorders (such as aspiration, dysphagia, achalasia, motility and reflux) EXAMINATION You may be asked to change into a gown, depending on the type of swallow being performed. A radiologist and radiographer will perform the procedure. The radiologist will advise you of the type of contrast selected for your procedure and direct you during the exam. You will be asked to stand, sit or lie in several different positions and to hold a small amount of fluid in your mouth before being asked to swallow while the imaging is performed .In some instances you may be asked to swallow barium coated marshmallows to assess the motility of a solid food bolus. The exam can be recorded as a digital or video fluoroscopy procedure. POST PROCEDURE It will take 1-2 days for the barium to pass through your system. To facilitate this, it is important, unless otherwise directed, to increase your fluids for the next 24-48hrs and to resume your normal diet.  This test typically takes about 30 minutes to  perform. __________________________________________________________________________________  Thank you for choosing me and [facility].  Pricilla Riffle. Dagoberto Ligas., MD., Marval Regal

## 2014-09-24 NOTE — Progress Notes (Signed)
History of Present Illness: This is a 79 year old male accompanied by his wife. He has several complaints today including a gradual weight loss with a decrease in appetite over the past 1 to1-1/2 years. He notes dysphasia with large pills but not with foods. He also has a rumbling and gurgling sensation in his upper abdomen following his morning medications. He has a history of GERD with an esophageal stricture. He was advised by his nephrologist to discontinue Nexium and to use Pepcid AC twice daily. He also notes feeling fatigued and short of breath after bowel movements. He denies any straining or difficulty in passing bowel movements. He also has significant easy fatigability and very poor exercise tolerance. He has several severe comorbidities including Wegener's granulomatosis maintained on immunosuppressive medications, cardiomyopathy and stage III kidney disease.  Allergies  Allergen Reactions  . Factive [Gemifloxacin] Other (See Comments)    Ineffective    Outpatient Prescriptions Prior to Visit  Medication Sig Dispense Refill  . aspirin EC 81 MG tablet Take 81 mg by mouth daily.    Marland Kitchen b complex vitamins tablet Take 1 tablet by mouth daily.      . bisacodyl (DULCOLAX) 5 MG EC tablet Take 5 mg by mouth 2 (two) times daily.     . bisoprolol (ZEBETA) 5 MG tablet Take 0.5 tablets (2.5 mg total) by mouth daily. (Patient taking differently: Take 2.5 mg by mouth at bedtime. ) 30 tablet 3  . budesonide-formoterol (SYMBICORT) 160-4.5 MCG/ACT inhaler Inhale 2 puffs into the lungs 2 (two) times daily.    . clopidogrel (PLAVIX) 75 MG tablet Take 1 tablet (75 mg total) by mouth daily with breakfast. 30 tablet 10  . cyclophosphamide (CYTOXAN) 50 MG tablet Take 50 mg by mouth daily. Give on an empty stomach once daily at 1600. Take  1 hour before or 2 hours after meals.    Marland Kitchen dextromethorphan (DELSYM) 30 MG/5ML liquid Take 30 mg by mouth 2 (two) times daily as needed for cough.    . dutasteride  (AVODART) 0.5 MG capsule Take 0.5 mg by mouth every other day.    . Famotidine (PEPCID PO) Take 1 tablet by mouth 2 (two) times daily. OTC.    Marland Kitchen guaiFENesin (MUCINEX) 600 MG 12 hr tablet Take 600 mg by mouth 2 (two) times daily.    Marland Kitchen levalbuterol (XOPENEX HFA) 45 MCG/ACT inhaler Inhale 1-2 puffs into the lungs every 4 (four) hours as needed for wheezing. For shortness of breath 1 Inhaler 12  . loratadine (CLARITIN) 10 MG tablet Take 10 mg by mouth daily.     . methylcellulose (CITRUCEL) oral powder Take 1 packet by mouth daily.    . nitroGLYCERIN (NITROSTAT) 0.4 MG SL tablet Place 1 tablet (0.4 mg total) under the tongue every 5 (five) minutes x 3 doses as needed for chest pain. (Patient taking differently: Place 0.4 mg under the tongue every 5 (five) minutes as needed for chest pain. ) 25 tablet 12  . prednisoLONE 5 MG TABS tablet Take 7.5 mg by mouth daily.    . simvastatin (ZOCOR) 20 MG tablet Take 1 tablet (20 mg total) by mouth daily at 6 PM. 30 tablet 11  . sodium bicarbonate 650 MG tablet Take 2 tablets (1,300 mg total) by mouth 2 (two) times daily. 120 tablet 6  . tamsulosin (FLOMAX) 0.4 MG CAPS capsule Take 1 capsule (0.4 mg total) by mouth every evening. 30 capsule 11  . traMADol (ULTRAM) 50 MG tablet 1-2 every 4  hours as needed for cough or pain (Patient taking differently: Take 50 mg by mouth daily as needed for moderate pain. 1-2 every 4 hours as needed for cough or pain) 40 tablet 0   No facility-administered medications prior to visit.   Past Medical History  Diagnosis Date  . GERD (gastroesophageal reflux disease)   . Asthma   . Fungal infection     lungs  . Cataract   . BPH (benign prostatic hyperplasia)     sees Dr. Risa Grill, biopsy June 2015 was benign   . Wegener's granulomatosis     sees Dr. [name]   . Peptic stricture of esophagus   . Anemia   . Hiatal hernia   . Adenomatous colon polyp   . Bronchiectasis   . On home oxygen therapy     uses 2 l/m nasally at bedtime    . HOH (hard of hearing)     bilaterally  . HTN (hypertension) 09/28/2013  . CAD (coronary artery disease)     a. [place] s/p [place] x3 and ultimately BMS to pLAD& POBA to CTO of circumflex/marginal vessel on 03/17/14 and staged BMS to [facility] on 03/21/14  . Femoral artery pseudo-aneurysm, right     a. s/p repair. Follow up with Dr. Kellie Simmering   . Diverticulosis    Past Surgical History  Procedure Laterality Date  . Hernia repair    . Eye surgery      cataracts removed.   . Colonoscopy  08-16-11    per Dr. Fuller Plan, diverticulosis and polyps, repeat in 5 yrs   . Esophagogastroduodenoscopy (egd) with esophageal dilation  11-29-10    per Dr. Fuller Plan   . Cataract extraction, bilateral  12-18-12    bilateral  . Cholecystectomy N/A 12/21/2012    Procedure: LAPAROSCOPIC CHOLECYSTECTOMY WITH INTRAOPERATIVE CHOLANGIOGRAM;  Surgeon: Edward Jolly, MD;  Location: [facility] ORS;  Service: General;  Laterality: N/A;  . Hematoma evacuation Right 03/19/2014    Procedure: Suture repair of femoral artery with evacuation of hematoma;  Surgeon: Mal Misty, MD;  Location: [facility] OR;  Service: Vascular;  Laterality: Right;  . Coronary angioplasty  03/2014  . Left heart catheterization with coronary angiogram N/A 03/14/2014    Procedure: LEFT HEART CATHETERIZATION WITH CORONARY ANGIOGRAM;  Surgeon: Leonie Man, MD;  Location: [facility] CATH LAB;  Service: Cardiovascular;  Laterality: N/A;  . Percutaneous coronary stent intervention (pci-s) N/A 03/17/2014    Procedure: PERCUTANEOUS CORONARY STENT INTERVENTION (PCI-S);  Surgeon: Troy Sine, MD;  Location: [facility] CATH LAB;  Service: Cardiovascular;  Laterality: N/A;  . Cardiac catheterization  03/17/2014    Procedure: CORONARY BALLOON ANGIOPLASTY;  Surgeon: Troy Sine, MD;  Location: [facility] CATH LAB;  Service: Cardiovascular;;  . Percutaneous coronary stent intervention (pci-s) N/A 03/21/2014    Procedure: PERCUTANEOUS CORONARY STENT INTERVENTION (PCI-S);  Surgeon: Burnell Blanks, MD;   Location: [facility] CATH LAB;  Service: Cardiovascular;  Laterality: N/A;  . Renal biopsy     History   Social History  . Marital Status: Married    Spouse Name: N/A  . Number of Children: N/A  . Years of Education: N/A   Occupational History  . Retired    Social History Main Topics  . Smoking status: Never Smoker   . Smokeless tobacco: Never Used  . Alcohol Use: No  . Drug Use: No  . Sexual Activity: Yes   Other Topics Concern  . None   Social History Narrative   Family History  Problem Relation  Age of Onset  . Asthma Father   . Coronary artery disease Brother   . Coronary artery disease Brother   . Coronary artery disease Mother      Review of Systems: Pertinent positive and negative review of systems were noted in the above HPI section. All other review of systems were otherwise negative.   Physical Exam: General: Well developed, thin, chronically ill-appearing, no acute distress Head: Normocephalic and atraumatic Eyes:  sclerae anicteric, EOMI Ears: Normal auditory acuity Mouth: No deformity or lesions Neck: Supple, no masses or thyromegaly Lungs: Clear throughout to auscultation Heart: Regular rate and rhythm; no murmurs, rubs or bruits Abdomen: Soft, non tender and non distended. No masses, hepatosplenomegaly or hernias noted. Normal Bowel sounds Musculoskeletal: Symmetrical with no gross deformities  Skin: No lesions on visible extremities Pulses:  Normal pulses noted Extremities: No clubbing, cyanosis, edema or deformities noted Neurological: Alert oriented x 4, grossly nonfocal Cervical Nodes:  No significant cervical adenopathy Inguinal Nodes: No significant inguinal adenopathy Psychological:  Alert and cooperative. Normal mood and affect  Assessment and Recommendations:  1. Weight loss and anorexia. Secondary to his significant medical problems and possibly medication side effects.  2. Dysphagia with pills. No solid food dysphagia. History of an  esophageal stricture. Schedule barium esophagram.  3. GERD with a history of an esophageal stricture. Continue Pepcid AC twice daily. He was  advised by his nephrologist to remain off PPI medications.  4. Personal history of adenomatous colon polyps. Given his age and significant comorbidities there are no plans for routine surveillance colonoscopies.  5. Intestinal rumbling and gurgling post morning medications. Likely medication related. No further evaluation at this time.

## 2014-09-30 ENCOUNTER — Encounter (HOSPITAL_COMMUNITY)
Admission: RE | Admit: 2014-09-30 | Discharge: 2014-09-30 | Disposition: A | Discharge status: Home / Self Care | Payer: Medicare Other | Source: Ambulatory Visit | Attending: Nephrology | Admitting: Nephrology

## 2014-09-30 ENCOUNTER — Telehealth: Payer: Self-pay

## 2014-09-30 ENCOUNTER — Ambulatory Visit (HOSPITAL_COMMUNITY)
Admission: RE | Admit: 2014-09-30 | Discharge: 2014-09-30 | Disposition: A | Discharge status: Home / Self Care | Payer: Medicare Other | Source: Ambulatory Visit | Attending: Gastroenterology | Admitting: Gastroenterology

## 2014-09-30 ENCOUNTER — Encounter (HOSPITAL_COMMUNITY): Payer: Medicare Other

## 2014-09-30 DIAGNOSIS — R634 Abnormal weight loss: Secondary | ICD-10-CM | POA: Diagnosis not present

## 2014-09-30 DIAGNOSIS — D631 Anemia in chronic kidney disease: Secondary | ICD-10-CM | POA: Diagnosis not present

## 2014-09-30 DIAGNOSIS — R1314 Dysphagia, pharyngoesophageal phase: Secondary | ICD-10-CM | POA: Diagnosis not present

## 2014-09-30 LAB — POCT HEMOGLOBIN-HEMACUE: HEMOGLOBIN: 12.2 g/dL — AB (ref 13.0–17.0)

## 2014-09-30 MED ORDER — EPOETIN ALFA 40000 UNIT/ML IJ SOLN: 30000.0000 [IU] | INTRAMUSCULAR | Status: DC

## 2014-09-30 NOTE — Telephone Encounter (Signed)
RE: GARLAN DREWES DOB: [birth year] MRN: [id number]   Dear Dr. Debara Pickett,   Dr. Fuller Plan has scheduled the above patient for an endoscopic procedure. Our records show that he is on anticoagulation therapy.   Please advise as to how long the patient may come off his therapy of Plavix for 5 days  prior to the procedure, which is scheduled for 10/13/14.  Please route the completed form to Barb Merino, Therapist, sports at [facility].   Sincerely,    Barb Merino

## 2014-10-01 MED ORDER — EPOETIN ALFA 20000 UNIT/ML IJ SOLN
INTRAMUSCULAR | Status: AC
  Filled 2014-10-01: qty 1

## 2014-10-01 MED ORDER — EPOETIN ALFA 10000 UNIT/ML IJ SOLN
INTRAMUSCULAR | Status: AC
  Filled 2014-10-01: qty 1

## 2014-10-01 NOTE — Telephone Encounter (Signed)
Richard [name], He had bare metal stents in August and he can stop plavix for 5 days for his procedure. Can you please draft the letter? I will sign it tomorrow. MCr

## 2014-10-02 ENCOUNTER — Encounter: Payer: Self-pay | Admitting: *Deleted

## 2014-10-02 NOTE — Telephone Encounter (Signed)
10/02/2014  Re: Richard [name] DOB: [birth year] MRN: [id number]  Address: [address] [place] [address]   Dear Dr. Fuller Plan Barb Merino, RN),   Blossom Hoops can HOLD Plavix for 5 days prior to his procedure and resume after. It is ok to proceed without further cardiac testing.  Please call Dept: [phone] with any additional questions.   Sincerely,    Dr. Dani Gobble Croitoru

## 2014-10-02 NOTE — Telephone Encounter (Signed)
Letter routed to Dr. Carley Hammed, RN via [facility]. Letter printed for signature

## 2014-10-03 ENCOUNTER — Ambulatory Visit (AMBULATORY_SURGERY_CENTER): Payer: Self-pay

## 2014-10-03 VITALS — Ht 67.0 in | Wt 119.2 lb

## 2014-10-03 DIAGNOSIS — R1314 Dysphagia, pharyngoesophageal phase: Secondary | ICD-10-CM

## 2014-10-03 NOTE — Progress Notes (Signed)
No allergies to eggs or soy No home oxygen No diet/weight loss meds No past problems with anesthesia  Has email  Emmi instructions given for colonoscopy 

## 2014-10-13 ENCOUNTER — Encounter: Payer: Self-pay | Admitting: Gastroenterology

## 2014-10-13 ENCOUNTER — Ambulatory Visit (AMBULATORY_SURGERY_CENTER): Payer: Medicare Other | Admitting: Gastroenterology

## 2014-10-13 VITALS — BP 141/71 | HR 59 | Temp 97.5°F | Resp 12 | Ht 67.0 in | Wt 119.0 lb

## 2014-10-13 DIAGNOSIS — K2960 Other gastritis without bleeding: Secondary | ICD-10-CM

## 2014-10-13 DIAGNOSIS — R933 Abnormal findings on diagnostic imaging of other parts of digestive tract: Secondary | ICD-10-CM

## 2014-10-13 DIAGNOSIS — K222 Esophageal obstruction: Secondary | ICD-10-CM

## 2014-10-13 DIAGNOSIS — R1314 Dysphagia, pharyngoesophageal phase: Secondary | ICD-10-CM

## 2014-10-13 DIAGNOSIS — K319 Disease of stomach and duodenum, unspecified: Secondary | ICD-10-CM

## 2014-10-13 DIAGNOSIS — K2900 Acute gastritis without bleeding: Secondary | ICD-10-CM

## 2014-10-13 MED ORDER — SODIUM CHLORIDE 0.9 % IV SOLN: 500.0000 mL | INTRAVENOUS | Status: DC

## 2014-10-13 NOTE — Op Note (Signed)
[facility]  [address] [place], [address]   ENDOSCOPY PROCEDURE REPORT  PATIENT: Richard, [name]  MR#: [id number] BIRTHDATE: 12-17-1935 , 82  yrs. old GENDER: male ENDOSCOPIST: Ladene Artist, MD, [facility] PROCEDURE DATE:  10/13/2014 PROCEDURE:  EGD w/ biopsy and EGD w/ wire guided (savary) dilation  ASA CLASS:     Class III INDICATIONS:  dysphagia and abnormal barium esophagogram. MEDICATIONS: Monitored anesthesia care and Propofol 130 mg IV TOPICAL ANESTHETIC: none DESCRIPTION OF PROCEDURE: After the risks benefits and alternatives of the procedure were thoroughly explained, informed consent was obtained.  The LB [id number] endoscope was introduced through the mouth and advanced to the second portion of the duodenum , Without limitations.  The instrument was slowly withdrawn as the mucosa was fully examined.  SOPHAGUS: There was a short benign appearing stricture at the gastroesophageal junction.  The stricture was easily traversable. The stricture was dilated using a 76mm (42Fr) savary dilator over guidewire.  The stricture was dilated using a 41mm (45Fr) savary dilator over guidewire.  The stricture was dilated using a 75mm (48Fr) savary dilator over guidewire. STOMACH: Mild erosive gastritis (inflammation) was found on the greater curve of the stomach.  Multiple biopsies were performed. The stomach otherwise appeared normal. DUODENUM: The duodenal mucosa showed no abnormalities in the bulb and 2nd part of the duodenum.  Retroflexed views revealed a 6  cm hiatal hernia.     The scope was then withdrawn from the patient and the procedure completed.  COMPLICATIONS: There were no immediate complications.  ENDOSCOPIC IMPRESSION: 1.  Short stricture at the gastroesophageal junction; dilated using savary dilators over guidewire 2.   Erosive gastritis on the greater curve of the stomach; multiple biopsies performed 3.   6 cm hiatal  hernia  RECOMMENDATIONS: 1.  Await pathology results 2.  Anti-reflux regimen 3.  Continue PPI 4.  Post dilation instructions 5.  Resume Plavix tomorrow  eSigned:  Ladene Artist, MD, [facility] 10/13/2014 2:27 PM

## 2014-10-13 NOTE — Progress Notes (Signed)
Pt self administers albuterol by mdi, 2 puffs.,

## 2014-10-13 NOTE — Progress Notes (Signed)
To recovery, report to Myers, RN VSS 

## 2014-10-13 NOTE — Progress Notes (Signed)
Called to room to assist during endoscopic procedure.  Patient ID and intended procedure confirmed with present staff. Received instructions for my participation in the procedure from the performing physician.  

## 2014-10-13 NOTE — Patient Instructions (Addendum)
YOU HAD AN ENDOSCOPIC PROCEDURE TODAY AT [facility]:   Refer to the procedure report that was given to you for any specific questions about what was found during the examination.  If the procedure report does not answer your questions, please call your gastroenterologist to clarify.  If you requested that your care partner not be given the details of your procedure findings, then the procedure report has been included in a sealed envelope for you to review at your convenience later.  YOU SHOULD EXPECT: Some feelings of bloating in the abdomen. Passage of more gas than usual.  Walking can help get rid of the air that was put into your GI tract during the procedure and reduce the bloating. If you had a lower endoscopy (such as a colonoscopy or flexible sigmoidoscopy) you may notice spotting of blood in your stool or on the toilet paper. If you underwent a bowel prep for your procedure, you may not have a normal bowel movement for a few days.  Please Note:  You might notice some irritation and congestion in your nose or some drainage.  This is from the oxygen used during your procedure.  There is no need for concern and it should clear up in a day or so.  SYMPTOMS TO REPORT IMMEDIATELY:   Following upper endoscopy (EGD)  Vomiting of blood or coffee ground material  New chest pain or pain under the shoulder blades  Painful or persistently difficult swallowing  New shortness of breath  Fever of 100F or higher  Black, tarry-looking stools  For urgent or emergent issues, a gastroenterologist can be reached at any hour by calling [phone].   DIET: Your first meal following the procedure should be a small meal and then it is ok to progress to your normal diet. Heavy or fried foods are harder to digest and may make you feel nauseous or bloated.  Likewise, meals heavy in dairy and vegetables can increase bloating.  Drink plenty of fluids but you should avoid alcoholic beverages for  24 hours.  ACTIVITY:  You should plan to take it easy for the rest of today and you should NOT DRIVE or use heavy machinery until tomorrow (because of the sedation medicines used during the test).    FOLLOW UP: Our staff will call the number listed on your records the next business day following your procedure to check on you and address any questions or concerns that you may have regarding the information given to you following your procedure. If we do not reach you, we will leave a message.  However, if you are feeling well and you are not experiencing any problems, there is no need to return our call.  We will assume that you have returned to your regular daily activities without incident.  If any biopsies were taken you will be contacted by phone or by letter within the next 1-3 weeks.  Please call us at [phone] if you have not heard about the biopsies in 3 weeks.    SIGNATURES/CONFIDENTIALITY: You and/or your care partner have signed paperwork which will be entered into your electronic medical record.  These signatures attest to the fact that that the information above on your After Visit Summary has been reviewed and is understood.  Full responsibility of the confidentiality of this discharge information lies with you and/or your care-partner.  Recommendations Await biopsy results. Discharge instructions given to patient and/or care partner. Dilation instructions and dilation diet handout provided. Restart  Plavix tomorrow.

## 2014-10-13 NOTE — Progress Notes (Signed)
Patient denies any allergies to eggs or soy. 

## 2014-10-14 ENCOUNTER — Encounter (HOSPITAL_COMMUNITY): Payer: Medicare Other

## 2014-10-14 ENCOUNTER — Telehealth: Payer: Self-pay | Admitting: *Deleted

## 2014-10-14 ENCOUNTER — Encounter (HOSPITAL_COMMUNITY)
Admission: RE | Admit: 2014-10-14 | Discharge: 2014-10-14 | Disposition: A | Discharge status: Home / Self Care | Payer: Medicare Other | Source: Ambulatory Visit | Attending: Nephrology | Admitting: Nephrology

## 2014-10-14 DIAGNOSIS — N184 Chronic kidney disease, stage 4 (severe): Secondary | ICD-10-CM | POA: Insufficient documentation

## 2014-10-14 DIAGNOSIS — D631 Anemia in chronic kidney disease: Secondary | ICD-10-CM | POA: Insufficient documentation

## 2014-10-14 LAB — POCT HEMOGLOBIN-HEMACUE: HEMOGLOBIN: 10.7 g/dL — AB (ref 13.0–17.0)

## 2014-10-14 MED ORDER — EPOETIN ALFA 40000 UNIT/ML IJ SOLN: 30000.0000 [IU] | INTRAMUSCULAR | Status: DC

## 2014-10-14 NOTE — Telephone Encounter (Signed)
  Follow up Call-  Call back number 10/13/2014  Post procedure Call Back phone  # [phone]  Permission to leave phone message Yes     Patient questions:  Do you have a fever, pain , or abdominal swelling? No. Pain Score  0 *  Have you tolerated food without any problems? Yes.    Have you been able to return to your normal activities? Yes.    Do you have any questions about your discharge instructions: Diet   No. Medications  No. Follow up visit  No.  Do you have questions or concerns about your Care? No.  Actions: * If pain score is 4 or above: No action needed, pain <4.

## 2014-10-15 MED ORDER — EPOETIN ALFA 20000 UNIT/ML IJ SOLN
INTRAMUSCULAR | Status: AC
  Administered 2014-10-14: 20000 [IU] via SUBCUTANEOUS
  Filled 2014-10-15: qty 1

## 2014-10-15 MED ORDER — EPOETIN ALFA 10000 UNIT/ML IJ SOLN
INTRAMUSCULAR | Status: AC
  Administered 2014-10-14: 10000 [IU] via SUBCUTANEOUS
  Filled 2014-10-15: qty 1

## 2014-10-20 ENCOUNTER — Encounter: Payer: Self-pay | Admitting: Gastroenterology

## 2014-10-21 ENCOUNTER — Encounter (HOSPITAL_COMMUNITY)
Admission: RE | Admit: 2014-10-21 | Discharge: 2014-10-21 | Disposition: A | Discharge status: Home / Self Care | Payer: Medicare Other | Source: Ambulatory Visit | Attending: Nephrology | Admitting: Nephrology

## 2014-10-21 DIAGNOSIS — D631 Anemia in chronic kidney disease: Secondary | ICD-10-CM | POA: Diagnosis not present

## 2014-10-21 LAB — POCT HEMOGLOBIN-HEMACUE: Hemoglobin: 10.4 g/dL — ABNORMAL LOW (ref 13.0–17.0)

## 2014-10-21 MED ORDER — EPOETIN ALFA 40000 UNIT/ML IJ SOLN: 30000.0000 [IU] | INTRAMUSCULAR | Status: DC

## 2014-10-22 LAB — IRON AND TIBC
Iron: 66 ug/dL (ref 42–165)
Saturation Ratios: 35 % (ref 20–55)
TIBC: 188 ug/dL — ABNORMAL LOW (ref 215–435)
UIBC: 122 ug/dL — AB (ref 125–400)

## 2014-10-22 LAB — FERRITIN: FERRITIN: 463 ng/mL — AB (ref 22–322)

## 2014-10-22 MED ORDER — EPOETIN ALFA 20000 UNIT/ML IJ SOLN
INTRAMUSCULAR | Status: AC
  Administered 2014-10-21: 20000 [IU]
  Filled 2014-10-22: qty 1

## 2014-10-22 MED ORDER — EPOETIN ALFA 10000 UNIT/ML IJ SOLN
INTRAMUSCULAR | Status: AC
  Administered 2014-10-21: 10000 [IU]
  Filled 2014-10-22: qty 1

## 2014-10-23 IMAGING — CR DG CHEST 2V
2 series · 2 of 2 positions shown · non-contrast
Comparison: 02/12/2014, 12/06/2013, 11/07/2013, 10/07/2013, and CT
scan dated 10/04/2013

CLINICAL DATA: Shortness of breath, cough, chest congestion, and
chest pain. Wegener's granulomatosis.

EXAM:
CHEST  2 VIEW

[w chest pa]
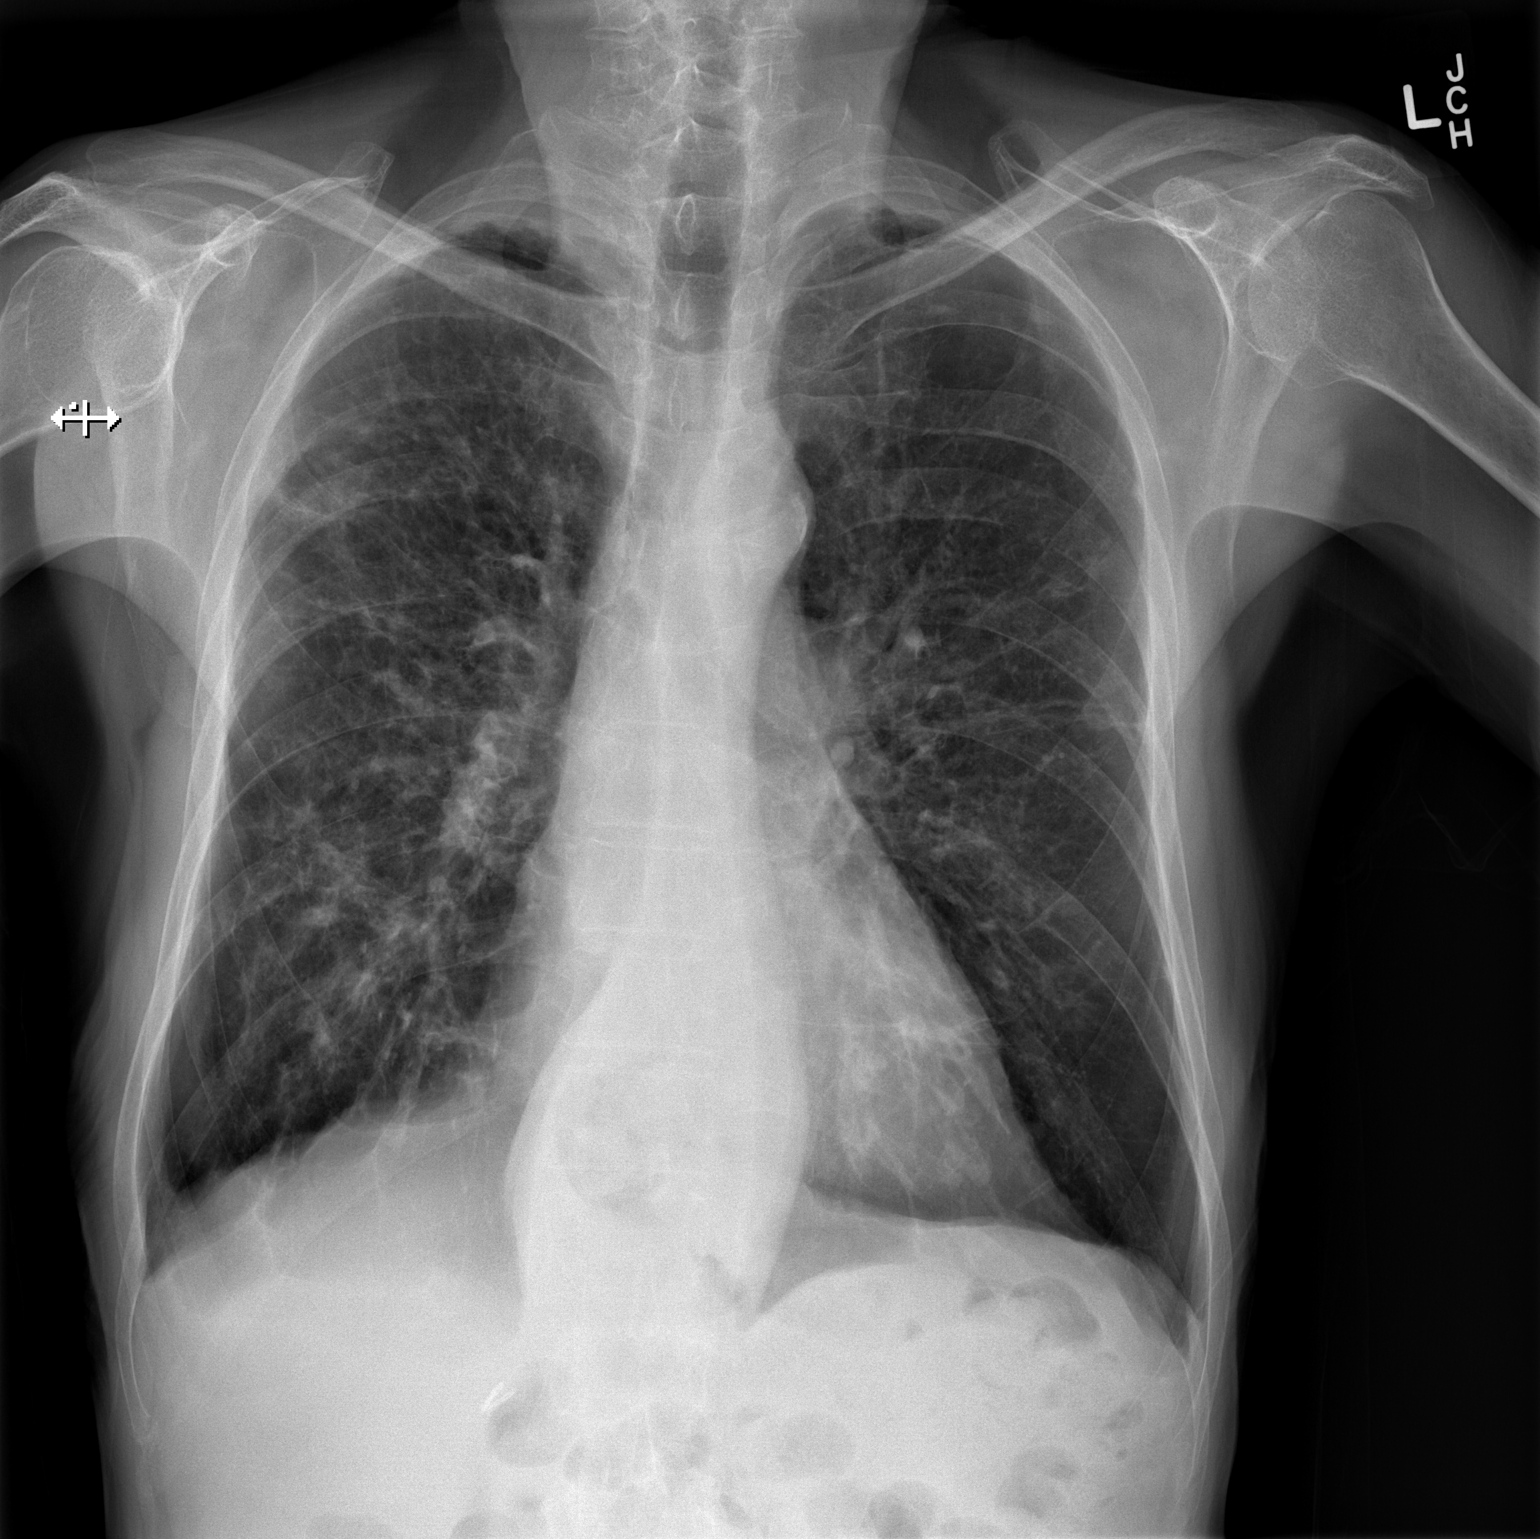

[w chest lat]
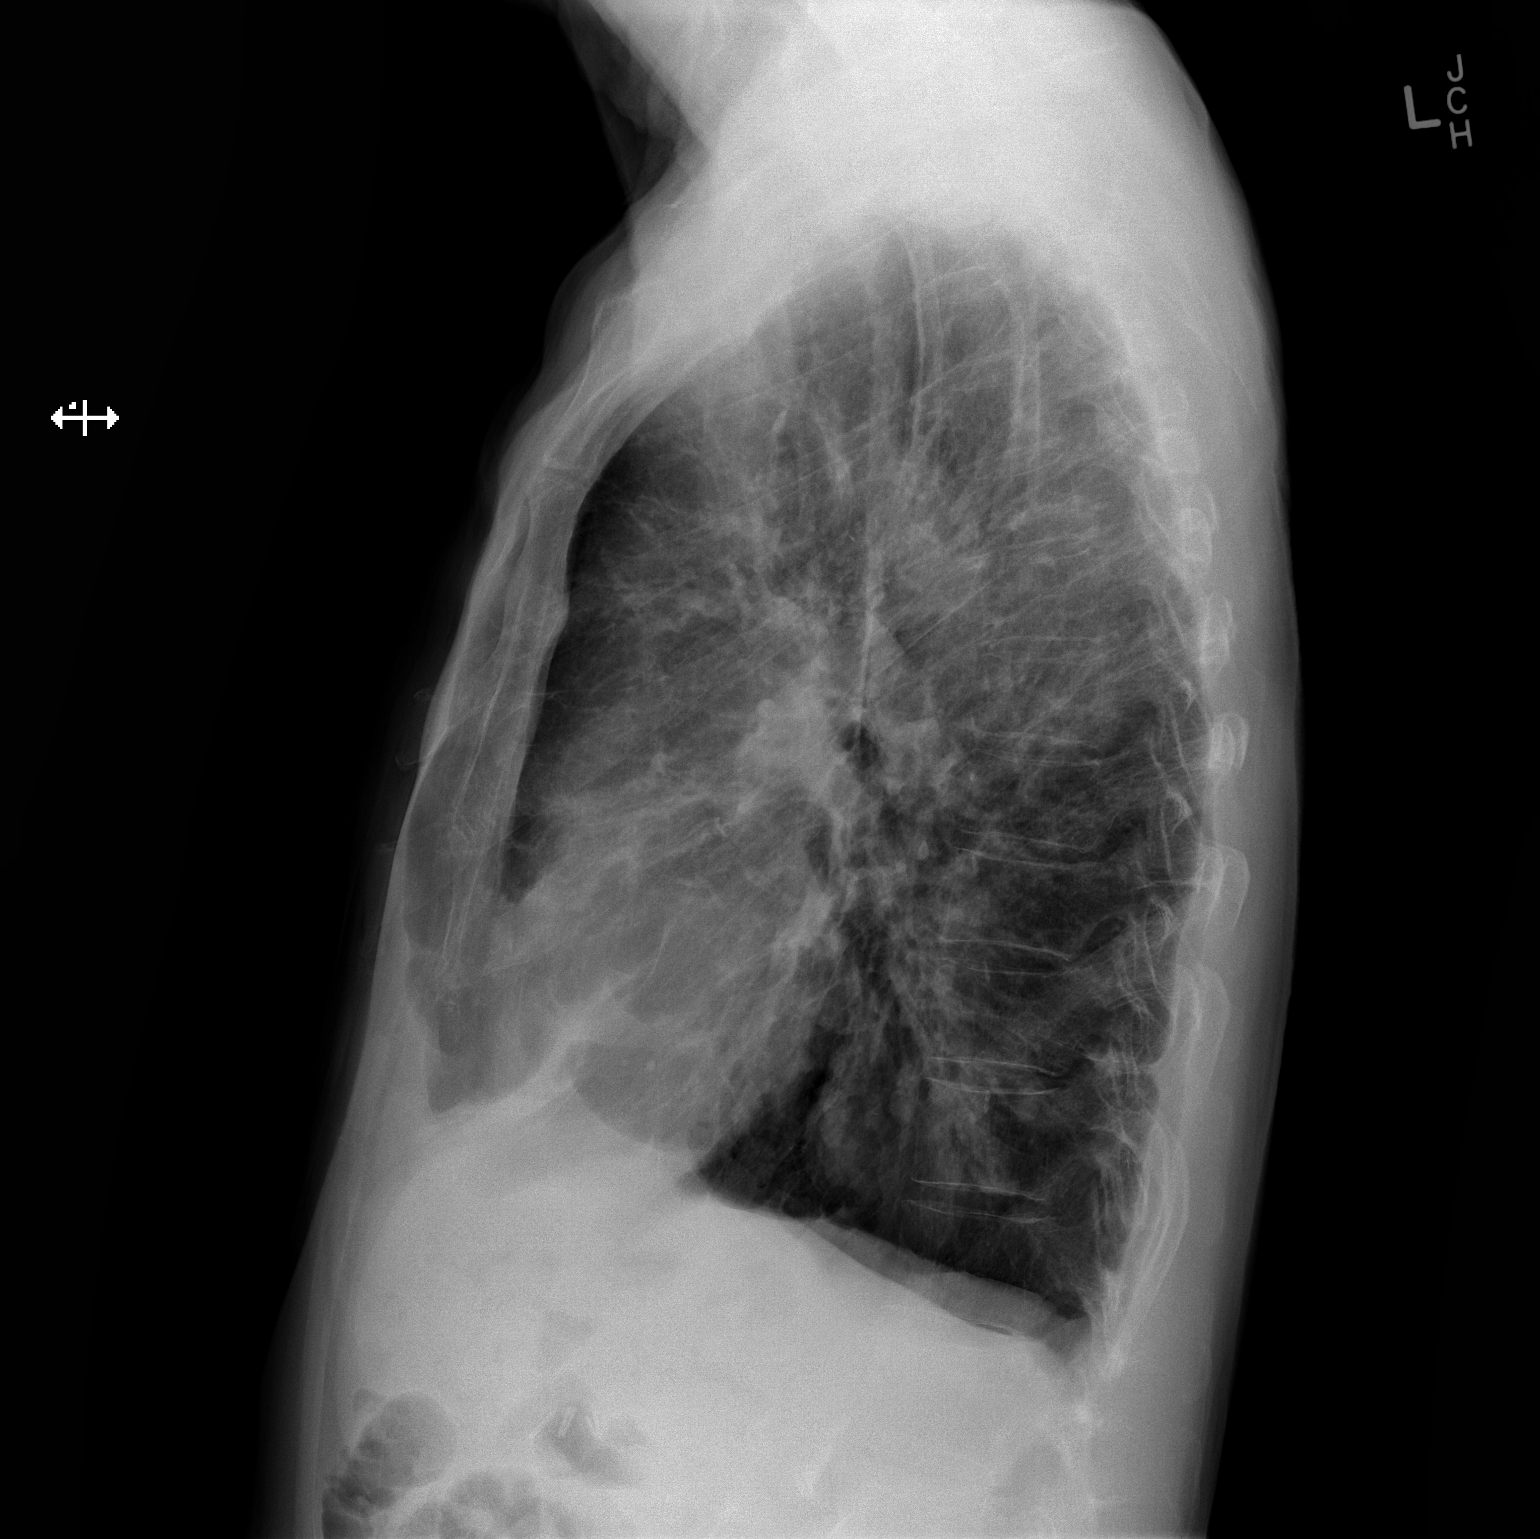

[2 of 2 positions shown; findings below may reference images not displayed]

FINDINGS: The lungs are hyperinflated consistent with emphysema. There are
multiple patchy areas of ill-defined infiltrate/ scarring as well as
cavitary lesions in both lungs. The appearance is essentially
unchanged. Heart size and pulmonary vascularity are normal. Large
hiatal hernia, stable. No acute osseous abnormality.
IMPRESSION: Severe chronic lung disease with emphysema and multiple areas of
scarring, cavitation, and patchy infiltrate consistent with
Wegener's granulomatosis.

No significant change since 02/12/2014.

## 2014-10-28 ENCOUNTER — Encounter (HOSPITAL_COMMUNITY)
Admission: RE | Admit: 2014-10-28 | Discharge: 2014-10-28 | Disposition: A | Discharge status: Home / Self Care | Payer: Medicare Other | Source: Ambulatory Visit | Attending: Nephrology | Admitting: Nephrology

## 2014-10-28 DIAGNOSIS — D631 Anemia in chronic kidney disease: Secondary | ICD-10-CM | POA: Diagnosis not present

## 2014-10-28 LAB — POCT HEMOGLOBIN-HEMACUE: Hemoglobin: 11.8 g/dL — ABNORMAL LOW (ref 13.0–17.0)

## 2014-10-28 MED ORDER — EPOETIN ALFA 10000 UNIT/ML IJ SOLN
INTRAMUSCULAR | Status: AC
  Administered 2014-10-28: 10000 [IU]
  Filled 2014-10-28: qty 1

## 2014-10-28 MED ORDER — EPOETIN ALFA 40000 UNIT/ML IJ SOLN: 30000.0000 [IU] | INTRAMUSCULAR | Status: DC

## 2014-10-28 MED ORDER — EPOETIN ALFA 20000 UNIT/ML IJ SOLN
INTRAMUSCULAR | Status: AC
  Administered 2014-10-28: 20000 [IU]
  Filled 2014-10-28: qty 1

## 2014-10-29 IMAGING — CR DG CHEST 1V PORT
1 series · 1 of 1 positions shown · non-contrast
Comparison: Chest radiograph of 03/13/2014 and CT of 10/04/2013.

CLINICAL DATA: Central line position.  Right internal jugular.

EXAM:
PORTABLE CHEST - 1 VIEW

[AP]
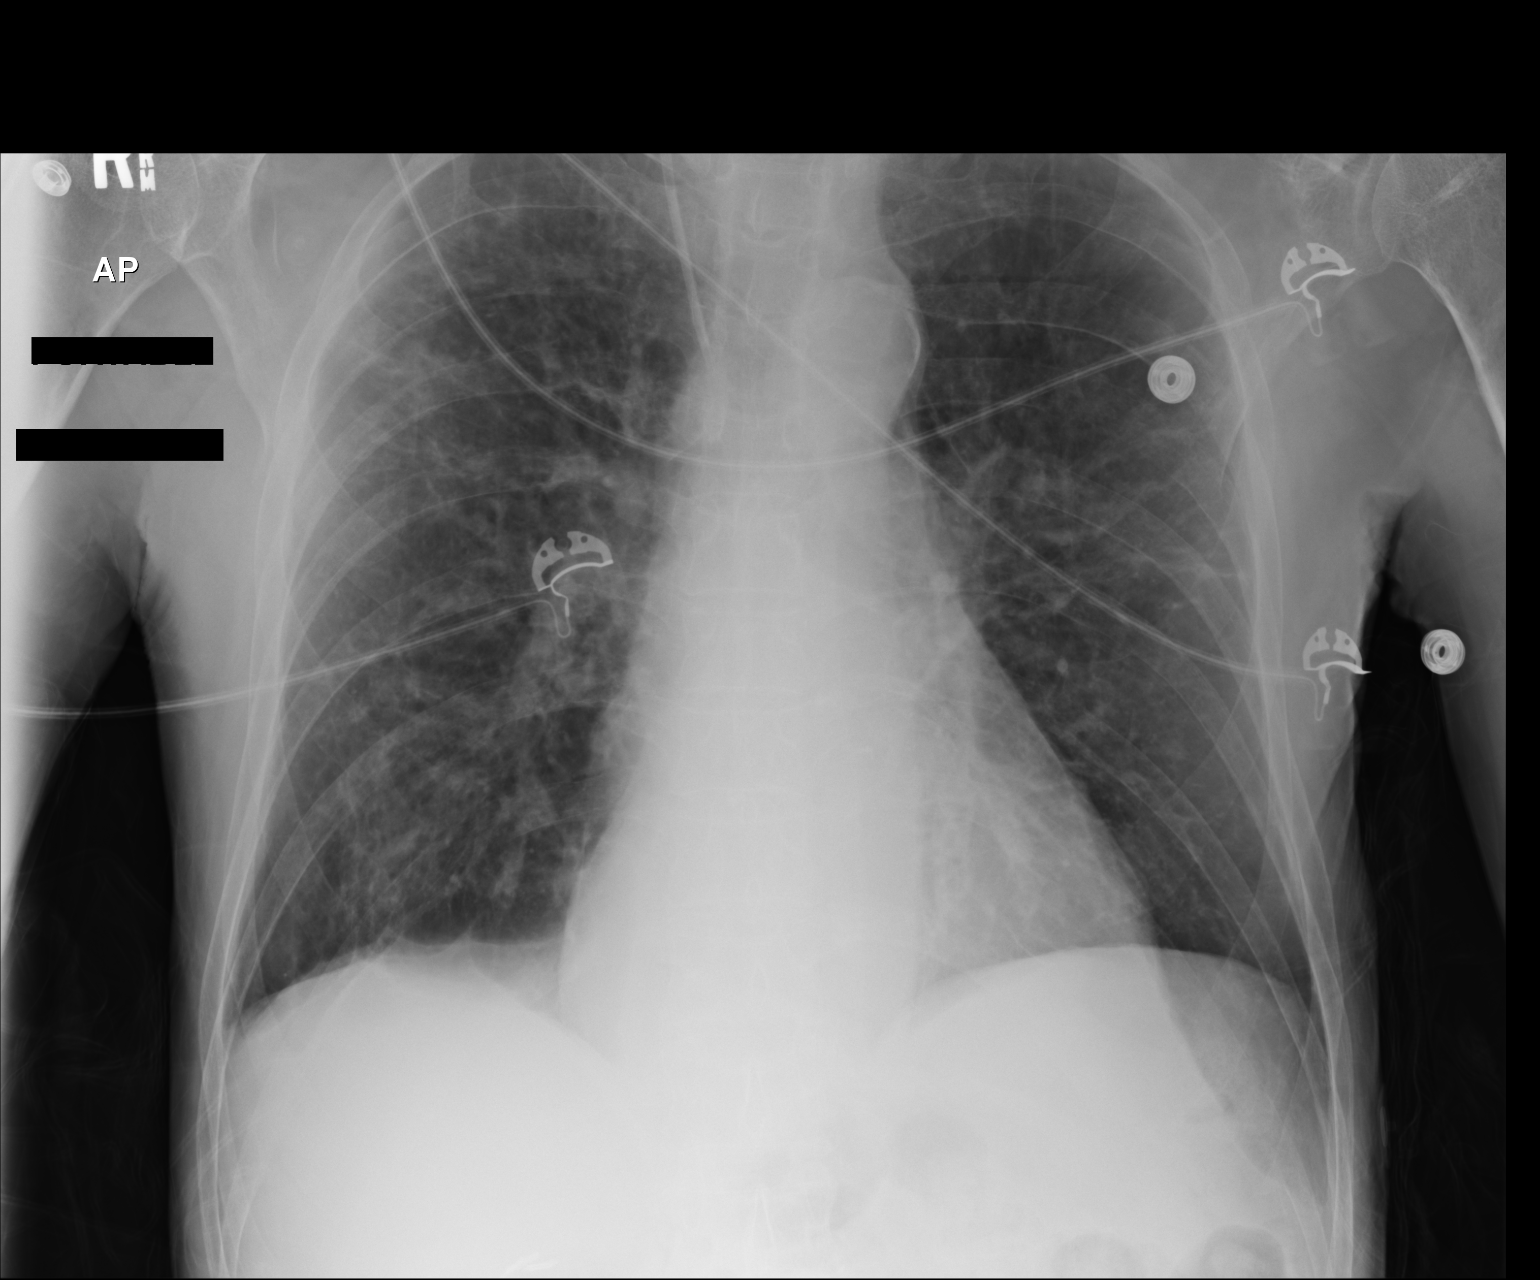

[1 of 1 positions shown; findings below may reference images not displayed]

FINDINGS: Right internal jugular line terminates at the high SVC. No
pneumothorax.

Midline trachea. Mild cardiomegaly with atherosclerosis in the
transverse aorta. No pleural fluid. Marked interstitial thickening
with scattered reticular nodular opacities. Slightly upper lobe
predominant. Overall slightly improved aeration, without new
consolidation.
IMPRESSION: Right internal jugular line terminating at the high SVC, without
pneumothorax.

Overall improved aeration with diffuse interstitial thickening and
reticular nodular opacification. Presumably related to the clinical
history of Wegener's granulomatosis. Atypical infection could look
similar.

## 2014-10-30 ENCOUNTER — Encounter: Payer: Self-pay | Admitting: Internal Medicine

## 2014-10-30 ENCOUNTER — Ambulatory Visit (INDEPENDENT_AMBULATORY_CARE_PROVIDER_SITE_OTHER): Payer: Medicare Other | Admitting: Internal Medicine

## 2014-10-30 VITALS — BP 102/68 | HR 95 | Ht 67.0 in | Wt 119.6 lb

## 2014-10-30 DIAGNOSIS — J471 Bronchiectasis with (acute) exacerbation: Secondary | ICD-10-CM | POA: Diagnosis not present

## 2014-10-30 NOTE — Patient Instructions (Signed)
No changes in your meds  Followup in 3 months with Dr [name], sooner if needed

## 2014-10-30 NOTE — Progress Notes (Signed)
Subjective:    Patient ID: Richard [name], male    DOB: 1936-05-02     MRN: [id number]   Brief patient profile:  35  yowm never smoker with dx of Longstanding bronchiectasis then dx with WG 03/2010 > requested establish with Buryl Bamber.   Admit [facility] dx WG by pos c anca 03/2010, supportive tbbx but not vats   Admit [name] dx spont L Ptx 9/08/15/09 requiring Chest tube but no surgery   05/24/10--NP ov/ med reivew. Since last visit. has been hospitalized 05/12/2010-- 10/10/2011for Right pneumothorax., with underlying Wegener granulomatosis. and . Bronchiectasis. Pt was found to have Right Pneumo. Chest tube was placed. with improvement and removed 10/7. Xray on 10/10 w/ resolved pnuemo. He was continued on cytoxan and bactrim for PCP prophylaxis. rec. Prednisone 20 mg reduce to one half daily  Aspirin 81 mg one daily with breakfast but stop for any bleeding  See Patient Care Coordinator before leaving for cardiology appt > saw [name], ? LHC needed       11/23/2012 f/u ov/Tymeshia Awan re WG/ bronchiectasis Chief Complaint  Patient presents with  . Follow-up    Had PFT today-sob same,cough-yellow,wheezing occass.,  no hemoptysis, no real limiting sob though sedentary, no arthralgias or rash maintaining 2.5 mg daily No change prednisone 5 mg one half daily  > ok to try every other day if doing great May 1 flare rx levaquin and pred May 16 lap chole [facility]     08/19/2013 f/u ov/Enjoli Tidd re: ? Recurrent WG Chief Complaint  Patient presents with  . Follow-up    Breathing is unchanged. Cough unchanged-prod cpugh w/ yellow phlem. On 2nd dose of cipro  now on day 5/10 cipro and less dark, never bloody, no sinus complaints, arthritis rash or fever or hematuria. >ANCA 1:80 titer, started on cytoxan        09/27/2013 f/u ov/Catori Panozzo re:  WG on pred 10 and ctx 50 mg daily  Chief Complaint  Patient presents with  . Follow-up    Pt states cough and SOB seems worse since the last visit. Still producing yellow  sputum. He states has low grade temp every am.   assoc with nasal congestion but no epistaxis or hemoptysis  rec Stop Zebeta Schedule sinus CT> 2/09/14/13 Mild to moderate mucosal thickening in the paranasal sinuses without  air-fluid level. For cough use the mucinex dm up to 1200 mg every 12 hours and as much flutter valve as possible For breathing use xopenex up to 2 puffs every 4 hours if needed and don't leave home wihout it   Admit date: 10/04/2013  Discharge date: 10/07/2013  Discharge Diagnoses:  SOB (shortness of breath)  Wegener's granulomatosis  Bronchiectasis with acute exacerbation  SVT (supraventricular tachycardia)  Cough  Weakness generalized  Discharge Condition: Stable and improved  Filed Weights    10/04/13 1539   Weight:  58.423 kg (128 lb 12.8 oz)   History of present illness:  Patient is a pleasant 79 year old white man with a past medical history significant for Wegener's granulomatosis diagnosed in 2011 was on Cytoxan until 2013. He follows with Dr. [name]. He is maintained on prednisone 10 mg daily. He had been feeling well until mid January at which point a chest x-ray showed increased nodules and Dr. [name] decided to restart Cytoxan. Patient came into the hospital with the above-mentioned complaints. His heart rate was noted to be in the 180s in the rhythm of SVT. He converted to sinus rhythm spontaneously. CT scan of  the chest shows bronchiectasis as well as numerous cavitary and non-cavitary nodules. We have been asked to admit him for further evaluation and management. In addition to his cough and mild shortness of breath, he endorses low-grade temperatures for about 2 weeks as well as increasing sinus symptoms.  Hospital Course:  Wegener's Granulomatosis/Bronchiectasis with Acute Exacerbation  -Cx data remains negative to date.  -As discussed with Dr. [name], will transition antibiotics to cipro for 3 more days, continue cytoxan (bactrim while on cytoxan for PCP  prophylaxis), and slow steroid taper until seen again by Dr. [name].  SVT  -Spontaneously converted to NSR.          12/06/13 to ER with dehyrdration   01/09/2014 f/u ov/Zenna Traister re:  Chief Complaint  Patient presents with  . Follow-up    Pt c/o non prod cough and states that his breathing is no better. He c/o poor appetite.   rec If mucus gets nasty,  Levaquin 750 x 7 days If mucus gets thick >  mucinex dm 1200 mg every 12 h and use the flutter valve   02/12/2014 f/u ov/Debrah Granderson re: bronchiectasis / pred 10 / cyt 37 Chief Complaint  Patient presents with  . Follow-up    Pt states that his symptoms are unchanged since last visit. Finished round of levaquin x 2 days ago.  No new co's today.   ok at [employer] leaning on the cart  New occ chest discomfort when sob x sev months, goes away at rest. No assoc diaph, nausea, radiation Has never tried xopenex for this symptom to date, encouraged to do so rec Please see patient coordinator before you leave today  to schedule VEST Tdap today      Patient ID: Richard [name]  MRN: [id number], DOB/AGE: 11/23/1935 79 y.o.  Admit date: 03/13/2014  D/C date: 03/26/2014   Primary Cardiologist: Dr. Debara Pickett  Principal Problem:  Unstable Class III Angina  Active Problems:  HYPOTHYROIDISM  HYPERLIPIDEMIA  ANEMIA  Wegener's granulomatosis  Chronic respiratory failure  Abnormal nuclear stress test: Intermediate risk with moderate region of apical and inferior scar with mild superimposed ischemia; EF 40%  CAD- severe 4 V CAD- not CABG candidate  Anemia- transfused 03/16/14  Chronic renal insufficiency, stage III (moderate)  Cardiomyopathy, ischemic- EF 40% by Myoview  Acute blood loss anemia  Femoral artery pseudo-aneurysm, right  Admission Dates: 03/13/14 - 03/26/14  Discharge Diagnosis: [place] s/p [facility] x3 and ultimately BMS to pLAD& POBA to CTO of circumflex/marginal vessel on 03/17/14 and staged BMS to mRCA on 03/21/14  HPI: Richard [name] is a 79 y.o. male  with a history of hypothyroidism, HLD, anemia, Wegener's granulomatosis and no prior cardiac disease who presented to [facility] ED on 03/13/14 with chest pain and was transferred to [facility] for cath the following morning.  PMH significant for Wegener's granulomatosis and follows with Dr. [name] for his pulmonary disease (severe multifocal bronchiectasis as well as numerous cavitary and non-cavitary nodules). He had been having chest tightness with significant exertion for the last few months with increasing dyspnea on exertion. Also had 2 brothers who both had CABG. Referred to Dr. Debara Pickett who proceeded with nuclear stress testing which demonstrated a "moderate region of apical and inferior scar with mild superimposed qualitative ischemia". LV wall motion demonstrated a "moderate degree of hypokinesis involving the mid-distal inferior wall and apex with an EF of 40%". Prior echo dated 03/30/2010 showed EF 45-50%.  Hospital Course: He presented to [facility] ED with increasing fatigue  and chest tightness which developed that morning, which resolved within 10 minutes.  He was given ASA, heparin, and nitro paste. ECG demonstrated sinus tachycardia with no acute ST-T abnormalities. POC troponin normal. Also noted to be anemic, Hgb 9 (10 on 12/06/13). Dr. [name] noted that Mr. Toro has had hemoptysis in the past, therefore limiting DAPT was ideal and BMS over DES thought to be the best option.  [place]/CAD:  -- Abnormal outpatient nuclear stress test on 03/12/14: Intermediate risk with moderate region of apical and inferior scar with mild superimposed ischemia; EF 40%  -- s/p first LHC on 03/14/14 which revealed severe multivessel CAD involving all 4 major branches. Seen by Dr. Madolyn Frieze on this admission who deemed him unsuitable for CABG due to underlying comorbid conditions. ( sts score >30). It was elected to proceed with staged multivessel PCI .  -- s/p LHC on 03/17/14 with successful PCI to the LAD and POBA to a chronically occluded LCX. There  remained high grade proximal disease to a large proximal Ramus branch as well as mid-RCA disease so staged PCI was planned at a later date due to pulmonary issues and renal dysfunction. This was further complicated by right femoral artery pseudoaneurysm requiring repair. LHC on 03/21/14 s/p PCI with rotablator atherectomy with BMS to the mid RCA. Due to length of fluoro time and large volume of contrast, it was decided to medically manage the ramus stenosis which Dr. Angelena Form felt was moderately stenosed and if he has recurrent anginal then consider PCI.  -- Continue ASA/BB/Plavix/statin  Right femoral pseudoaneurysm: s/p suture repair of femoral artery with evacuation of hematoma on 03/19/14. Still with significant hematoma down buttox and hamstring but improving. CBC stable at 7.7.  -- Has follow up with Dr. Kellie Simmering on 04/08/2014  Anemia: subsequent to right femoral pseudoaneurysm- s/p surgical repair. S/p blood transfusion x 2. Hgb improved from 7.4 to 8.6. Stable at 7.7 on discharge.  --Will send home on Iron 324 mg daily with colace 100 mg BID to help prevent constipation  SOB - BNP elevated at 4937. D-Dimer elevated at 2.6. CTA with no acute PE. No s/s of volume overload  -- Though to be possibly due to anemia. Chest xray showed multiple cavitary lesions but no CHF. CTA with multiple abnormalities possibly reflecting acute bronchitis vs organizing PNA. PCCM (followed by Dr. [name]) consulted who felt that the CT was at his baseline and to continue current therapy. He thought his SOB was likely multifactorial and anemia also playing a role.  -- BNP elevated at 4937. D-Dimer elevated at 2.6. CTA with no acute PE. No s/s of volume overload  Chronic systolic CHF- 2 D ECHO with LVEF 40-45%, mid to distal inferoapical and apical hypokinesis, diastolic dysfunction with elevated LV filling pressure, mild to moderate RV hypokinesis.  Wegener's granulomatosis. ( per PCCM)  -- Continue cytoxan, prednisone  --  Bactrim for PCP prophylaxis  -- Will need pulmonary rehab as an outpatient  Chronic renal insufficiency, stage III (moderate).  -- Creat elevated but stable after contrast exposure for CTA  -- Creat stable at 1.48 today. Continue to monitor  The patient has had a prolonged hospital course but is recovering well. The femoral catheter site is stable. He has been seen by Dr. Irish Lack today and deemed ready for discharge home. All follow-up appointments have been scheduled. Discharge medications are listed below.     04/11/2014 ext post hosp  f/u ov/Lexys Milliner re: bronchiectasis / angina resolved p stent Chief Complaint  Patient presents with  . Follow-up    couging yellowish   Not using vest yet due to concerns with stent and plans on talking to cards first. cipro best recent rx for purulent sputum but doesn't have a prn on hand  maint on pred 10 mg per day and cytoxan 50  rec Cipro 500 mg twice daily x 10 days as needed for nasty mucus    05/23/2014 f/u ov/Jacson Rapaport re: WG on cytoxan 50/ pred 10 with CRI f/u by nephrology  Chief Complaint  Patient presents with  . Follow-up    Wegener's granulomatosis; SOB, cough, spitting up yellow sputum, finished Levaquin 1 week ago  feels levaquin may work a little better than cipro No need for saba Not limited by breathing from desired activities  As by gen weakness  No hemoptysis   rec No change in prednisone or cytoxan dosing Have your kidney doctor send me the results of your labs if not in the [facility] ([facility]) Please remember to go to the  x-ray department downstairs for your tests    08/19/2014 acute  ov/Octavis Sheeler re: ? pna vs flare of bronchiectasis  Chief Complaint  Patient presents with  . Acute Visit    Pt c/o chest congestion that started on 08/16/14- started on levaquin and then next day started having CP.  He states that his breathing seems to be slightly worse than usual for him.   one week acute onset  sense of building up fluid/ congestion in  chest" just like many times before "with change in sputum color ? Red ? Started levaquin 08/16/14 then new pattern of cp 1/11 = moderate/ severe gen bilateral  Ant worse with deep breathing or  coughing or exp to cold air   Did not try xopenex or ntg/ stopped levaquin and pain resolved Pain was like a tightness s  radiation or assoc sweathing nausea or vomiting  rec Increase prednisone to 15 mg per day until better then back to 7.5 mg daily  Augmentin 875 mg take one pill twice daily  X 10 days - take at breakfast and supper with large glass of water.  It would help reduce the usual side effects (diarrhea and yeast infections) if you ate cultured yogurt at lunch.  For breathing/coughing/ congestion >  Up to 2 puff every 4 hours as needed  Make sure you are taking pepcid 20 mg after bfast and supper     09/02/2014 Follow up : Bronchiectasis/NP Patient returns for a two-week follow-up. Last visit. Patient with a bronchiectatic exacerbation. He was treated with Augmentin 10 days and a prednisone burst. Patient has started to slowly feel improved with decreased discolored mucus. Shortness of breath is not quite as bad. He has made very slow progress. He denies any hemoptysis, orthopnea, PND, leg swelling or calf pain. Appetite is fair. No nausea, vomiting or diarrhea. Chest x-ray last visit showed chronic changes rec Continue on current regimen  Continue with VEST therapy.     10/30/2014 f/u ov/Alaynna Kerwood re: bronchiectasis/AB Chief Complaint  Patient presents with  . Follow-up    Pt c/o increased SOB and chest congestion for the past 5 days. Cough is prod with yellow sputum.     rx with levaquin x 2 days but only used saba twice ? How much can you use it if you are having a bad day with your breathing A Not sure Baseline = sob x more than slow adls  No obvious day to day or  daytime variabilty or assoc   cp or chest tightness, subjective wheeze overt sinus or hb symptoms. No unusual exp hx or  h/o childhood pna/ asthma or knowledge of premature birth.  Sleeping ok without nocturnal  or early am exacerbation  of respiratory  c/o's or need for noct saba. Also denies any obvious fluctuation of symptoms with weather or environmental changes or other aggravating or alleviating factors except as outlined above   Current Medications, Allergies, Complete Past Medical History, Past Surgical History, Family History, and Social History were reviewed in [employer] record.  ROS  The following are not active complaints unless bolded sore throat, dysphagia, dental problems, itching, sneezing,  nasal congestion or excess/ purulent secretions, ear ache,   fever, chills, sweats, unintended wt loss, pleuritic or exertional cp, hemoptysis,  orthopnea pnd or leg swelling, presyncope, palpitations, heartburn, abdominal pain, anorexia, nausea, vomiting, diarrhea  or change in bowel or urinary habits, change in stools or urine, dysuria,hematuria,  rash, arthralgias, visual complaints, headache, numbness weakness or ataxia or problems with walking or coordination,  change in mood/affect or memory.                    Objective:   Physical Exam  wt   128  04/26/11 >07/08/2011  134> 134 11/28/2011 > 02/29/2012 136> 05/31/2012  136>  134 11/22/12 > 07/26/2013  136 > 08/21/2013  136 >133 09/06/2013 >  09/12/2013  133 > 132 09/27/2013 > 10/15/2013 131 > 11/07/2013  128 > 01/09/14 124 > 02/12/2014  123 > 04/11/2014   117 > 05/23/2014 119 > 08/19/2014 110 > 10/30/2014  120   amb thin pleasant wm nad  SKIN: no rash, lesions  NODES: no lymphadenopathy  HEENT: [place]/AT, EOM- WNL, Conjuctivae- clear, PERRLA, TM-WNL, Nose- clear, Throat- clear and wnl, Mallampati III  NECK: Supple w/ fair ROM, JVD- none, normal carotid impulses w/o bruits Thyroid-  CHEST: prominent  bilateral mid exp rhonchi  No resp distress HEART: RRR, no m/g/r heard  ABDOMEN: Soft and nl EXT:  No deformities or restrucitons     CXR PA and  Lateral:   08/19/2014 :      1. Chronic fibrotic change with hyper aeration. No definite active process. 2. Moderate size hiatal hernia.       Assessment & Plan:

## 2014-11-01 ENCOUNTER — Encounter: Payer: Self-pay | Admitting: Internal Medicine

## 2014-11-01 IMAGING — CR DG CHEST 2V
2 series · 2 of 2 positions shown · non-contrast
Comparison: None.

CLINICAL DATA: Short of breath

EXAM:
CHEST  2 VIEW

[w chest pa]
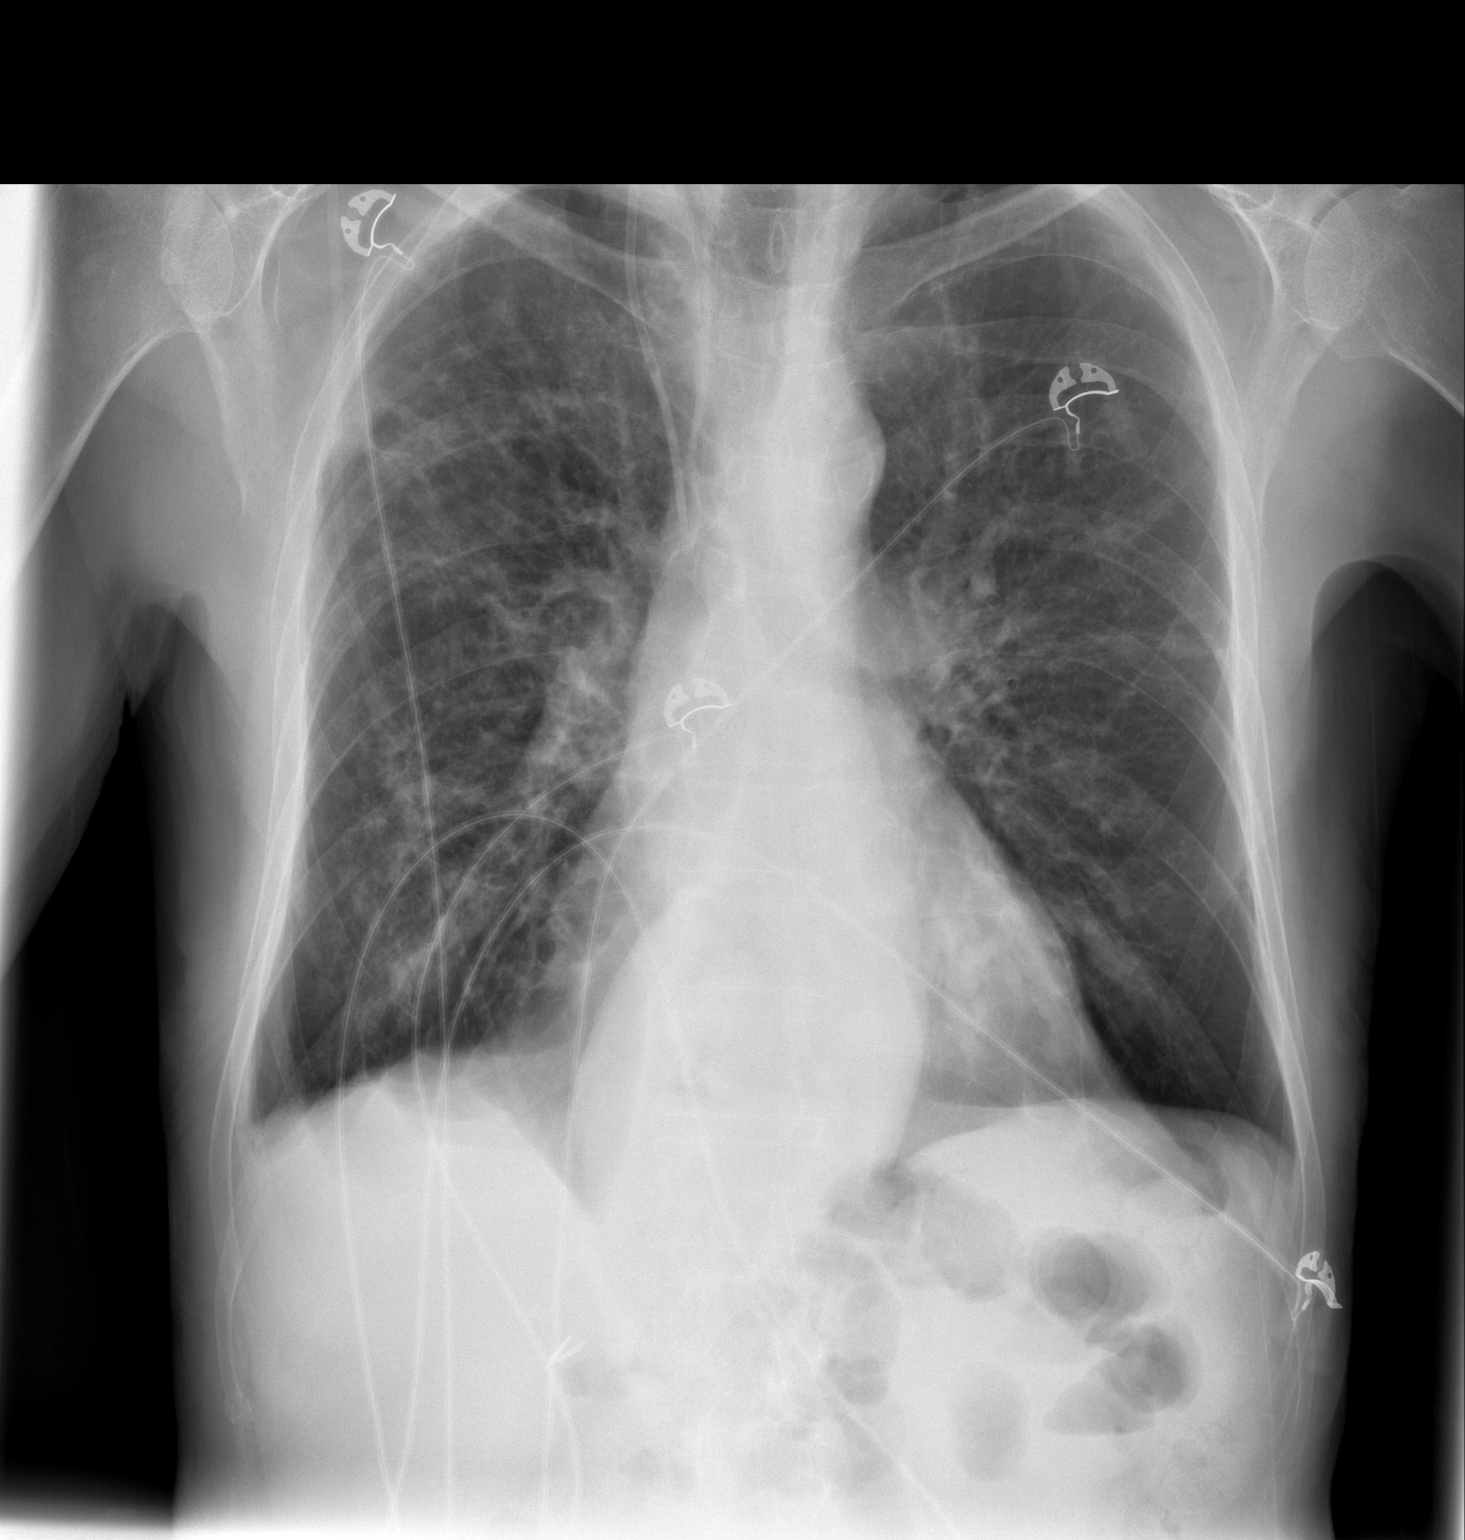

[w chest lat]
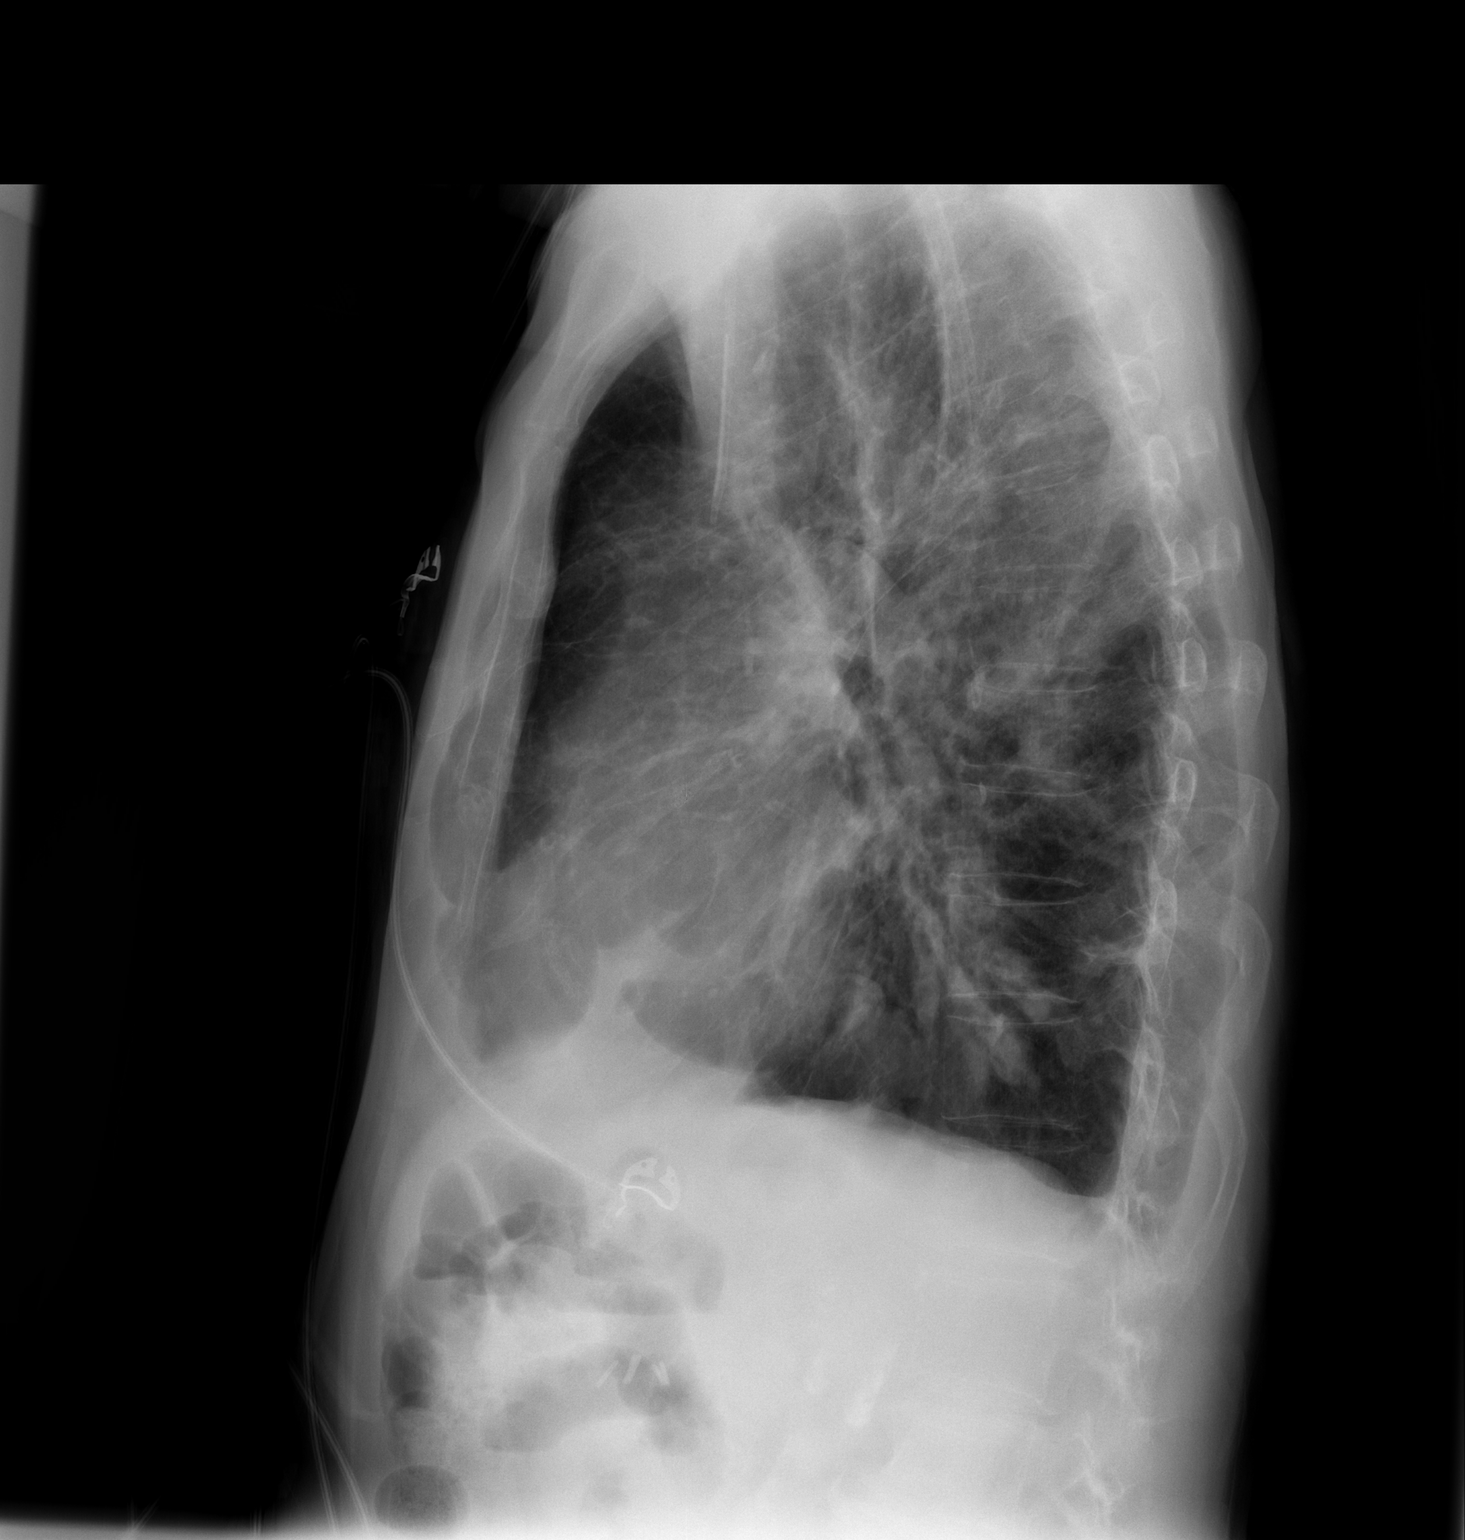

[2 of 2 positions shown; findings below may reference images not displayed]

FINDINGS: Normal cardiac silhouette with ectatic aorta. There is chronic
linear markings within the left and right lung. Multiple cavitary
lesions are noted on comparison CT of 10/04/2013. Lungs are
hyperinflated. No pneumothorax.
IMPRESSION: No change in bilateral pulmonary opacities which represent cavitary
lesions on comparison CT.

Hyperinflated lungs.

## 2014-11-01 NOTE — Assessment & Plan Note (Signed)
-    HFA 90% p coaching 12/16/2010   -   PFT's 06/21/11   FEV1  1.60 (61%) ratio 48 and 10% better p B2,  DLCO 83%  - CT 10/04/13 severe multifocal bronchiectasis as well as numerous cavitary and      non-cavitary nodules  -   PFT's 11/22/2012 FEV1  1.52 (62%) arnd 46 and no change p B2 DLCO 89%  -    VEST rec 02/12/2014 > reported improvement 05/23/14    I had an extended discussion with the patient and his wife reviewing all relevant studies completed to date and  lasting 15 to 20 minutes of a 25 minute visit on the following ongoing concerns:  1) mild flare approp rx with levaquin s hemoptysis or signs of pna  2) Each maintenance medication was reviewed in detail including most importantly the difference between maintenance and as needed and under what circumstances the prns are to be used.  Please see instructions for details which were reviewed in writing and the patient given a copy.   He and his wife have resisted our attempts to help him with med calendar with action plan but reminded him that prns are listed at bottom of the one we used before that clearly states "if trouble breathing/ wheezing" use alb up to  2 pffs every 4 hours and asked him to follow this and all the instructions for the pnrs as they are written  F/u can be q 3 m sooner prn

## 2014-11-02 IMAGING — CT CT ANGIO CHEST
2 of 8 series · 18 of 46 positions shown · IV contrast (Omni 300)
Comparison: Current chest radiograph.  Chest CT dated 10/04/13

CLINICAL DATA: Elevated D-dimer.

EXAM:
CT ANGIOGRAPHY CHEST WITH CONTRAST
TECHNIQUE: Multidetector CT imaging of the chest was performed using the
standard protocol during bolus administration of intravenous
contrast. Multiplanar CT image reconstructions and MIPs were
obtained to evaluate the vascular anatomy.
CONTRAST:  100 mL of Omnipaque 350 intravenous contrast.

[Series 5: thins · axial · 0.62mm/px · z∈[+1335,+1577]mm · 15 of 274 slices shown]
[im 16/274  lung]
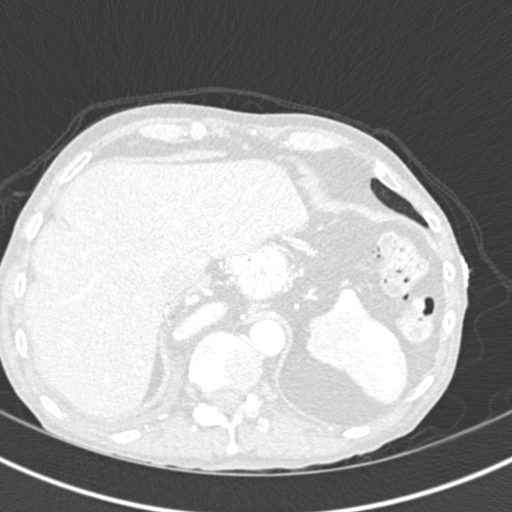
[im 31/274  soft-tissue]
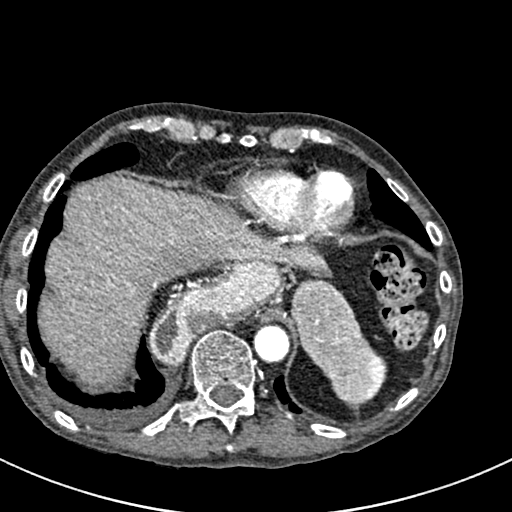
[im 46/274  lung]
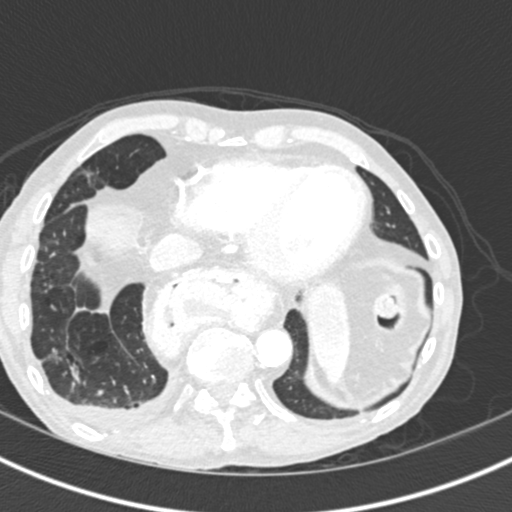
[im 61/274  soft-tissue]
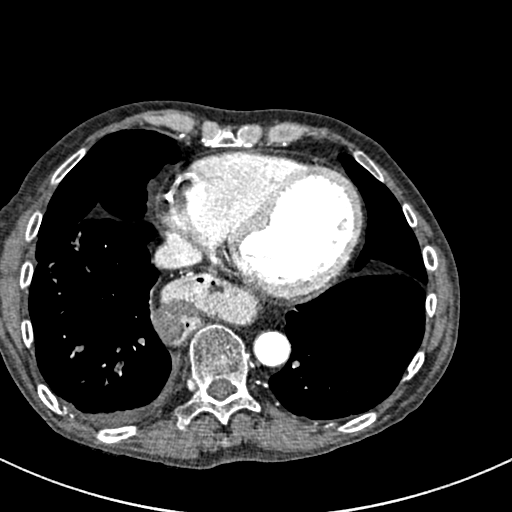
[im 92/274  lung]
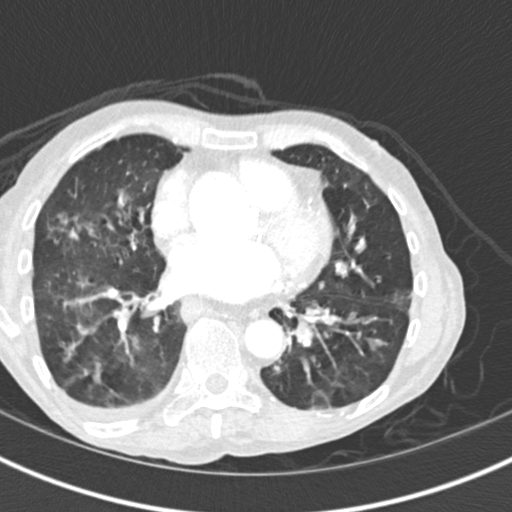
[im 107/274  soft-tissue]
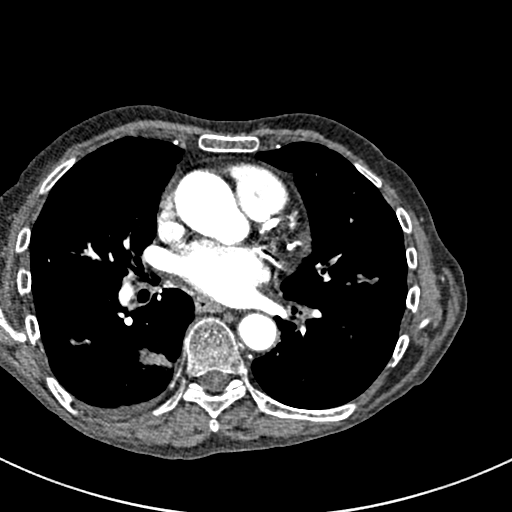
[im 122/274  lung]
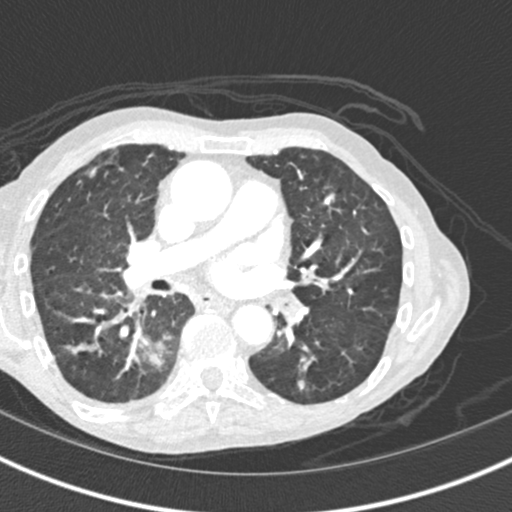
[im 137/274  soft-tissue]
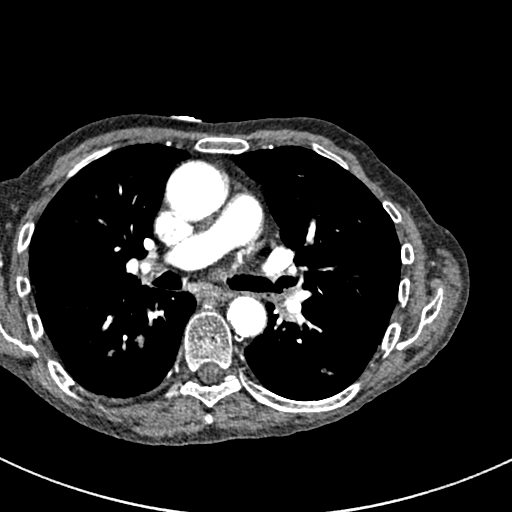
[im 152/274  lung]
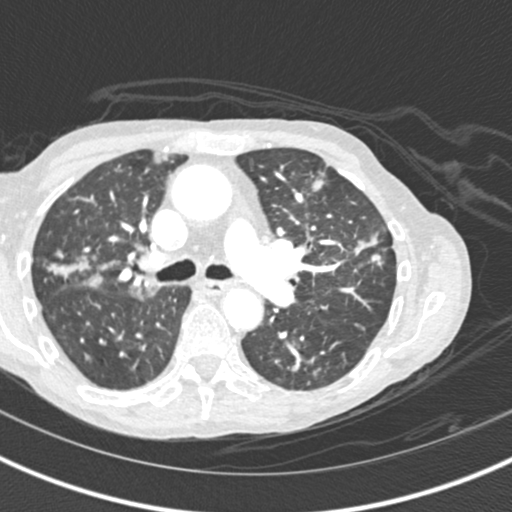
[im 167/274  soft-tissue]
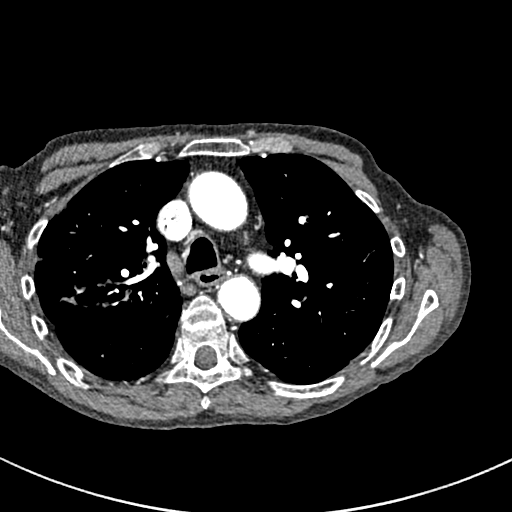
[im 183/274  lung]
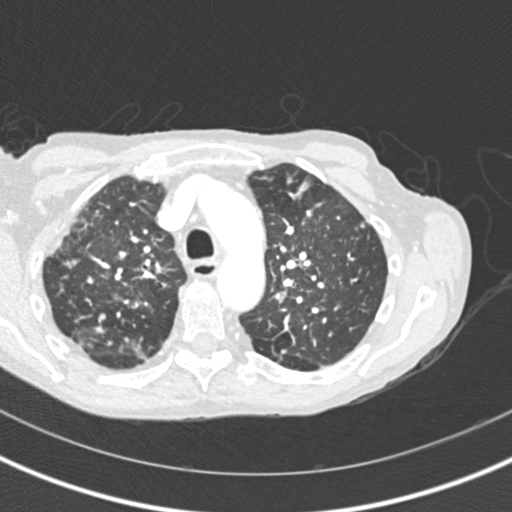
[im 213/274  soft-tissue]
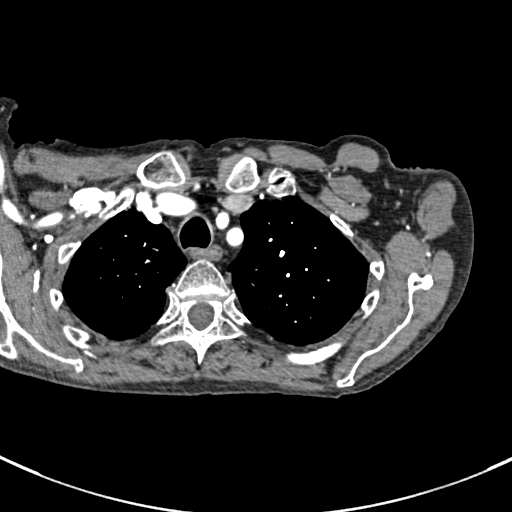
[im 228/274  lung]
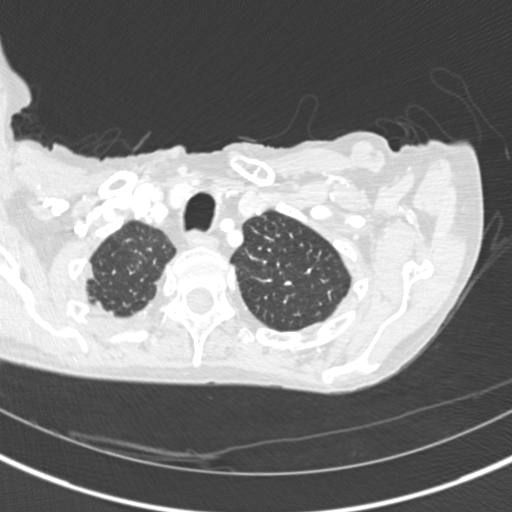
[im 243/274  soft-tissue]
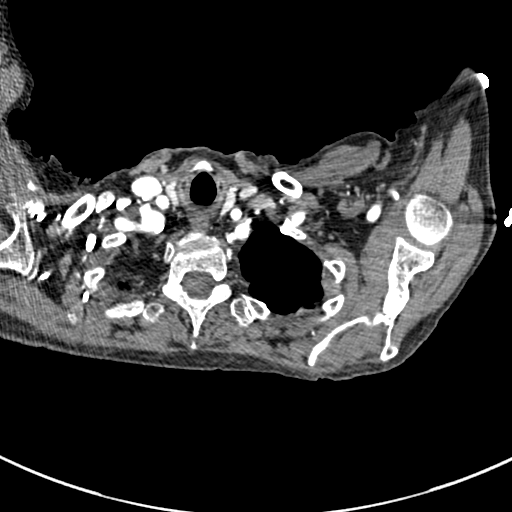
[im 258/274  lung]
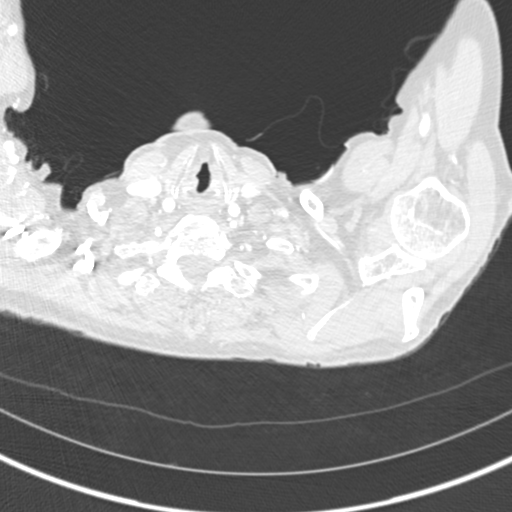

[Series 602: coronals · coronal · 0.62mm/px · 3 of 64 slices shown]
[im 16/64  soft-tissue]
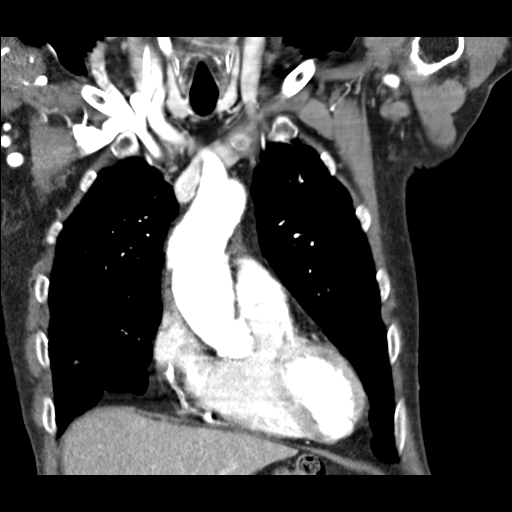
[im 32/64  soft-tissue]
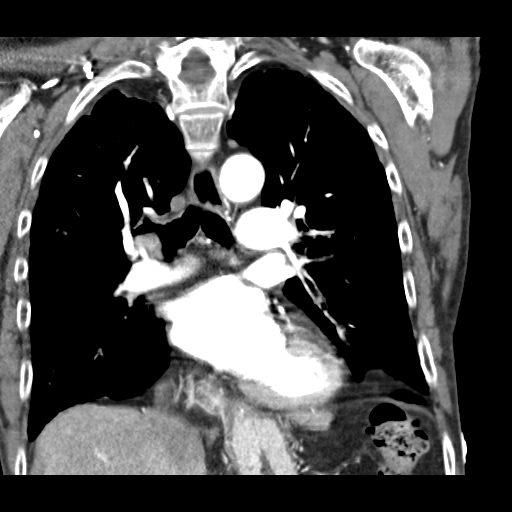
[im 48/64  soft-tissue]
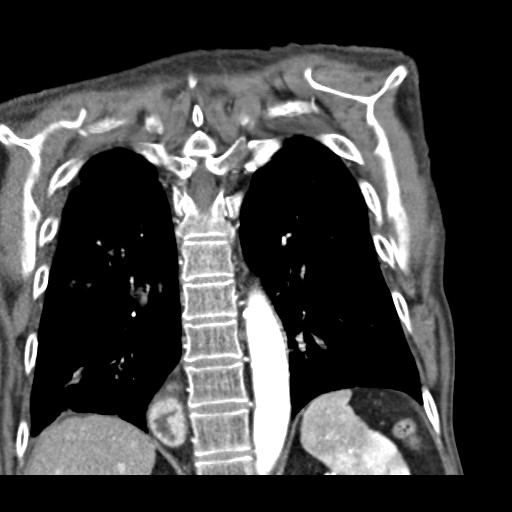

[18 of 46 positions shown; findings below may reference images not displayed]

FINDINGS: Study somewhat limited by motion in the lung bases limiting
evaluation for PE in the smaller segmental and subsegmental vessels.
Allowing for this, there is no evidence of pulmonary embolism.

Heart is normal in size. There are dense coronary artery
calcifications. Great vessels are normal in caliber for age. No
aortic dissection. No neck base, axillary, mediastinal or hilar
masses or pathologically enlarged lymph nodes.

There is peribronchial interstitial thickening most evident in the
lower lobes, right middle lobe and lingula of the left upper lobe.
There are multiple areas of mucous plugging distending bronchi, most
prominently in the left lower lobe. There are additional areas of
patchy airspace opacity in the lungs, most prominently in the
posterior medial right lower lobe and lateral left upper lobe.
Additional areas of peripheral reticular opacity are likely due to
subsegmental atelectasis. Minimal right pleural effusion. No
pneumothorax.

Limited evaluation of the upper abdomen shows a moderate hiatal
hernia but is otherwise unremarkable. No significant bony
abnormality.

Review of the MIP images confirms the above findings.
IMPRESSION: 1. No evidence of a pulmonary embolus.
2. There are lung abnormalities with peribronchial interstitial
thickening most evident in the lower lobes, right middle lobe and
inferior left upper lobe, areas of mucous plugging in bronchi, and
other smaller areas of ill-defined focal airspace opacity. Acute
bronchitis with areas of multifocal pneumonia is suspected. The
bronchial mucous plugging and interstitial thickening may be chronic
with the small focal areas of lung opacity reflecting a more chronic
process such as organizing pneumonia.

## 2014-11-04 ENCOUNTER — Other Ambulatory Visit: Payer: Self-pay | Admitting: Internal Medicine

## 2014-11-04 ENCOUNTER — Encounter (HOSPITAL_COMMUNITY)
Admission: RE | Admit: 2014-11-04 | Discharge: 2014-11-04 | Disposition: A | Discharge status: Home / Self Care | Payer: Medicare Other | Source: Ambulatory Visit | Attending: Nephrology | Admitting: Nephrology

## 2014-11-04 DIAGNOSIS — D631 Anemia in chronic kidney disease: Secondary | ICD-10-CM | POA: Diagnosis not present

## 2014-11-04 LAB — POCT HEMOGLOBIN-HEMACUE: Hemoglobin: 12 g/dL — ABNORMAL LOW (ref 13.0–17.0)

## 2014-11-04 MED ORDER — EPOETIN ALFA 40000 UNIT/ML IJ SOLN: 30000.0000 [IU] | INTRAMUSCULAR | Status: DC

## 2014-11-10 ENCOUNTER — Other Ambulatory Visit: Payer: Self-pay | Admitting: Internal Medicine

## 2014-11-18 ENCOUNTER — Encounter (HOSPITAL_COMMUNITY)
Admission: RE | Admit: 2014-11-18 | Discharge: 2014-11-18 | Disposition: A | Discharge status: Home / Self Care | Payer: Medicare Other | Source: Ambulatory Visit | Attending: Nephrology | Admitting: Nephrology

## 2014-11-18 DIAGNOSIS — N184 Chronic kidney disease, stage 4 (severe): Secondary | ICD-10-CM | POA: Diagnosis not present

## 2014-11-18 DIAGNOSIS — D631 Anemia in chronic kidney disease: Secondary | ICD-10-CM | POA: Insufficient documentation

## 2014-11-18 LAB — POCT HEMOGLOBIN-HEMACUE: Hemoglobin: 11.2 g/dL — ABNORMAL LOW (ref 13.0–17.0)

## 2014-11-18 MED ORDER — EPOETIN ALFA 20000 UNIT/ML IJ SOLN
INTRAMUSCULAR | Status: AC
  Administered 2014-11-18: 20000 [IU] via SUBCUTANEOUS
  Filled 2014-11-18: qty 1

## 2014-11-18 MED ORDER — EPOETIN ALFA 10000 UNIT/ML IJ SOLN
INTRAMUSCULAR | Status: AC
  Administered 2014-11-18: 10000 [IU] via SUBCUTANEOUS
  Filled 2014-11-18: qty 1

## 2014-11-18 MED ORDER — EPOETIN ALFA 40000 UNIT/ML IJ SOLN: 30000.0000 [IU] | INTRAMUSCULAR | Status: DC

## 2014-11-19 ENCOUNTER — Telehealth: Payer: Self-pay | Admitting: Internal Medicine

## 2014-11-19 LAB — FERRITIN: Ferritin: 495 ng/mL — ABNORMAL HIGH (ref 22–322)

## 2014-11-19 LAB — IRON AND TIBC
Iron: 103 ug/dL (ref 42–165)
Saturation Ratios: 49 % (ref 20–55)
TIBC: 212 ug/dL — ABNORMAL LOW (ref 215–435)
UIBC: 109 ug/dL — ABNORMAL LOW (ref 125–400)

## 2014-11-19 NOTE — Telephone Encounter (Signed)
That would be preferred - but if cost is too much, could try taking 12.5 mg toprol Xl daily.  Dr. Lemmie Evens

## 2014-11-19 NOTE — Telephone Encounter (Signed)
Pt wants to know if he should continue to take Bisoprolol?

## 2014-11-19 NOTE — Telephone Encounter (Signed)
Returned call to patient he stated his insurance will not cover Bisoprolol.Stated he does not know which med they will cover.Advised I will send message to Dr.Hilty for advice.

## 2014-11-20 MED ORDER — TOPROL XL 25 MG PO TB24: ORAL_TABLET | ORAL | Status: DC

## 2014-11-20 NOTE — Telephone Encounter (Signed)
Returned call to patient Dr.Hilty advised ok when finished with bisoprolol start toprol 12.5 mg daily.

## 2014-11-21 ENCOUNTER — Telehealth: Payer: Self-pay | Admitting: Internal Medicine

## 2014-11-21 NOTE — Telephone Encounter (Signed)
Pt called in wanting some clarification on his directions for his Toprol medication. Please f/u with the pt  Thanks

## 2014-11-21 NOTE — Telephone Encounter (Signed)
Advised to take 1/2 tablet daily (12.5mg  of 25mg  tablet). Pt voiced understanding.

## 2014-11-24 ENCOUNTER — Encounter: Payer: Self-pay | Admitting: Family Medicine

## 2014-11-24 ENCOUNTER — Ambulatory Visit (INDEPENDENT_AMBULATORY_CARE_PROVIDER_SITE_OTHER): Payer: Medicare Other | Admitting: Family Medicine

## 2014-11-24 ENCOUNTER — Other Ambulatory Visit: Payer: Self-pay | Admitting: *Deleted

## 2014-11-24 VITALS — BP 104/68 | HR 86 | Temp 98.5°F | Ht 67.0 in | Wt 118.0 lb

## 2014-11-24 DIAGNOSIS — J0100 Acute maxillary sinusitis, unspecified: Secondary | ICD-10-CM

## 2014-11-24 MED ORDER — LEVOFLOXACIN 750 MG PO TABS
750.0000 mg | ORAL_TABLET | Freq: Every day | ORAL | Status: DC
Start: 2014-11-24 — End: 2015-03-04

## 2014-11-24 MED ORDER — FINASTERIDE 5 MG PO TABS: 5.0000 mg | ORAL_TABLET | Freq: Every day | ORAL | Status: DC

## 2014-11-24 MED ORDER — METOPROLOL SUCCINATE ER 25 MG PO TB24: 12.5000 mg | ORAL_TABLET | Freq: Every day | ORAL | Status: DC

## 2014-11-24 MED ORDER — HYDROCODONE-HOMATROPINE 5-1.5 MG/5ML PO SYRP: 5.0000 mL | ORAL_SOLUTION | ORAL | Status: DC | PRN

## 2014-11-24 NOTE — Progress Notes (Signed)
Pre visit review using our clinic review tool, if applicable. No additional management support is needed unless otherwise documented below in the visit note. 

## 2014-11-24 NOTE — Progress Notes (Signed)
   Subjective:    Patient ID: Richard [name], male    DOB: 01/08/1936, 79 y.o.   MRN: [id number]  HPI Here for 2 weeks of stuffy head, sinus pressure, PND, and coughing up yellow sputum. No fever. He took a week of Cipro with little improvement.   Review of Systems  Constitutional: Negative.   HENT: Positive for congestion, postnasal drip and sinus pressure.   Eyes: Negative.   Respiratory: Positive for cough.        Objective:   Physical Exam  Constitutional: He appears well-developed and well-nourished.  HENT:  Right Ear: External ear normal.  Left Ear: External ear normal.  Nose: Nose normal.  Mouth/Throat: Oropharynx is clear and moist.  Eyes: Conjunctivae are normal.  Pulmonary/Chest: Effort normal. He has no wheezes. He has no rales.  soft scattered rhonchi   Lymphadenopathy:    He has no cervical adenopathy.          Assessment & Plan:  Switch to Levaquin.

## 2014-11-24 NOTE — Telephone Encounter (Signed)
Received PA that TOPROL XL 25mg  tablet was rejected. Changed to metoprolol succinate 12.5mg  daily to see if med is covered.

## 2014-11-25 ENCOUNTER — Encounter (HOSPITAL_COMMUNITY)
Admission: RE | Admit: 2014-11-25 | Discharge: 2014-11-25 | Disposition: A | Discharge status: Home / Self Care | Payer: Medicare Other | Source: Ambulatory Visit | Attending: Nephrology | Admitting: Nephrology

## 2014-11-25 DIAGNOSIS — D631 Anemia in chronic kidney disease: Secondary | ICD-10-CM | POA: Diagnosis not present

## 2014-11-25 LAB — POCT HEMOGLOBIN-HEMACUE: Hemoglobin: 11.2 g/dL — ABNORMAL LOW (ref 13.0–17.0)

## 2014-11-25 MED ORDER — EPOETIN ALFA 40000 UNIT/ML IJ SOLN: 30000.0000 [IU] | INTRAMUSCULAR | Status: DC

## 2014-11-25 MED ORDER — EPOETIN ALFA 10000 UNIT/ML IJ SOLN
INTRAMUSCULAR | Status: AC
  Administered 2014-11-25: 10000 [IU] via SUBCUTANEOUS
  Filled 2014-11-25: qty 1

## 2014-11-25 MED ORDER — EPOETIN ALFA 20000 UNIT/ML IJ SOLN
INTRAMUSCULAR | Status: AC
  Administered 2014-11-25: 20000 [IU] via SUBCUTANEOUS
  Filled 2014-11-25: qty 1

## 2014-12-02 ENCOUNTER — Encounter (HOSPITAL_COMMUNITY)
Admission: RE | Admit: 2014-12-02 | Discharge: 2014-12-02 | Disposition: A | Discharge status: Home / Self Care | Payer: Medicare Other | Source: Ambulatory Visit | Attending: Nephrology | Admitting: Nephrology

## 2014-12-02 DIAGNOSIS — D631 Anemia in chronic kidney disease: Secondary | ICD-10-CM | POA: Diagnosis not present

## 2014-12-02 LAB — POCT HEMOGLOBIN-HEMACUE: Hemoglobin: 11.9 g/dL — ABNORMAL LOW (ref 13.0–17.0)

## 2014-12-02 MED ORDER — EPOETIN ALFA 20000 UNIT/ML IJ SOLN
INTRAMUSCULAR | Status: AC
  Administered 2014-12-02: 20000 [IU]
  Filled 2014-12-02: qty 1

## 2014-12-02 MED ORDER — EPOETIN ALFA 10000 UNIT/ML IJ SOLN
INTRAMUSCULAR | Status: AC
  Administered 2014-12-02: 10000 [IU]
  Filled 2014-12-02: qty 1

## 2014-12-02 MED ORDER — EPOETIN ALFA 40000 UNIT/ML IJ SOLN: 30000.0000 [IU] | INTRAMUSCULAR | Status: DC

## 2014-12-04 ENCOUNTER — Other Ambulatory Visit: Payer: Self-pay | Admitting: Internal Medicine

## 2014-12-04 NOTE — Telephone Encounter (Signed)
Is this okay to refill Dr [name]? Last ov says he is not taking?

## 2014-12-09 ENCOUNTER — Encounter (HOSPITAL_COMMUNITY)
Admission: RE | Admit: 2014-12-09 | Discharge: 2014-12-09 | Disposition: A | Discharge status: Home / Self Care | Payer: Medicare Other | Source: Ambulatory Visit | Attending: Nephrology | Admitting: Nephrology

## 2014-12-09 DIAGNOSIS — D631 Anemia in chronic kidney disease: Secondary | ICD-10-CM | POA: Diagnosis present

## 2014-12-09 DIAGNOSIS — N184 Chronic kidney disease, stage 4 (severe): Secondary | ICD-10-CM | POA: Diagnosis not present

## 2014-12-09 LAB — POCT HEMOGLOBIN-HEMACUE: Hemoglobin: 11.2 g/dL — ABNORMAL LOW (ref 13.0–17.0)

## 2014-12-09 MED ORDER — EPOETIN ALFA 10000 UNIT/ML IJ SOLN
INTRAMUSCULAR | Status: AC
  Administered 2014-12-09: 10000 [IU]
  Filled 2014-12-09: qty 1

## 2014-12-09 MED ORDER — EPOETIN ALFA 40000 UNIT/ML IJ SOLN: 30000.0000 [IU] | INTRAMUSCULAR | Status: DC

## 2014-12-09 MED ORDER — EPOETIN ALFA 20000 UNIT/ML IJ SOLN
INTRAMUSCULAR | Status: AC
  Administered 2014-12-09: 20000 [IU]
  Filled 2014-12-09: qty 1

## 2014-12-16 ENCOUNTER — Encounter (HOSPITAL_COMMUNITY)
Admission: RE | Admit: 2014-12-16 | Discharge: 2014-12-16 | Disposition: A | Discharge status: Home / Self Care | Payer: Medicare Other | Source: Ambulatory Visit | Attending: Nephrology | Admitting: Nephrology

## 2014-12-16 DIAGNOSIS — Z79899 Other long term (current) drug therapy: Secondary | ICD-10-CM | POA: Diagnosis not present

## 2014-12-16 DIAGNOSIS — N183 Chronic kidney disease, stage 3 (moderate): Secondary | ICD-10-CM | POA: Diagnosis not present

## 2014-12-16 DIAGNOSIS — D631 Anemia in chronic kidney disease: Secondary | ICD-10-CM | POA: Insufficient documentation

## 2014-12-16 DIAGNOSIS — Z5181 Encounter for therapeutic drug level monitoring: Secondary | ICD-10-CM | POA: Insufficient documentation

## 2014-12-16 LAB — IRON AND TIBC
IRON: 42 ug/dL — AB (ref 45–182)
Saturation Ratios: 21 % (ref 17.9–39.5)
TIBC: 204 ug/dL — ABNORMAL LOW (ref 250–450)
UIBC: 162 ug/dL

## 2014-12-16 LAB — FERRITIN: FERRITIN: 219 ng/mL (ref 24–336)

## 2014-12-16 LAB — POCT HEMOGLOBIN-HEMACUE: HEMOGLOBIN: 11.8 g/dL — AB (ref 13.0–17.0)

## 2014-12-16 MED ORDER — EPOETIN ALFA 10000 UNIT/ML IJ SOLN
INTRAMUSCULAR | Status: AC
  Administered 2014-12-16: 10000 [IU]
  Filled 2014-12-16: qty 1

## 2014-12-16 MED ORDER — EPOETIN ALFA 20000 UNIT/ML IJ SOLN
INTRAMUSCULAR | Status: AC
  Administered 2014-12-16: 20000 [IU]
  Filled 2014-12-16: qty 1

## 2014-12-16 MED ORDER — EPOETIN ALFA 40000 UNIT/ML IJ SOLN: 30000.0000 [IU] | INTRAMUSCULAR | Status: DC

## 2014-12-23 ENCOUNTER — Other Ambulatory Visit (HOSPITAL_COMMUNITY): Payer: Self-pay | Admitting: *Deleted

## 2014-12-24 ENCOUNTER — Encounter (HOSPITAL_COMMUNITY)
Admission: RE | Admit: 2014-12-24 | Discharge: 2014-12-24 | Disposition: A | Discharge status: Home / Self Care | Payer: Medicare Other | Source: Ambulatory Visit | Attending: Nephrology | Admitting: Nephrology

## 2014-12-24 DIAGNOSIS — D631 Anemia in chronic kidney disease: Secondary | ICD-10-CM | POA: Diagnosis not present

## 2014-12-24 LAB — POCT HEMOGLOBIN-HEMACUE: Hemoglobin: 12.5 g/dL — ABNORMAL LOW (ref 13.0–17.0)

## 2014-12-24 MED ORDER — FERUMOXYTOL INJECTION 510 MG/17 ML
510.0000 mg | Freq: Once | INTRAVENOUS | Status: AC
  Administered 2014-12-24: 510 mg via INTRAVENOUS
  Filled 2014-12-24: qty 17

## 2014-12-24 MED ORDER — EPOETIN ALFA 40000 UNIT/ML IJ SOLN: 30000.0000 [IU] | INTRAMUSCULAR | Status: DC

## 2014-12-25 ENCOUNTER — Other Ambulatory Visit: Payer: Self-pay | Admitting: Internal Medicine

## 2014-12-26 ENCOUNTER — Telehealth: Payer: Self-pay | Admitting: Internal Medicine

## 2014-12-26 MED ORDER — PREDNISONE 10 MG PO TABS: ORAL_TABLET | ORAL | Status: DC

## 2014-12-26 NOTE — Telephone Encounter (Signed)
Pt aware of rec's per RB Pred Taper called into pharmacy, pt aware that once he completes taper to remain on baseline dose of 7.5mg  daily.  Call our office on Monday with an update.  Nothing further needed.

## 2014-12-26 NOTE — Telephone Encounter (Signed)
Prednisone taper Take 40mg  daily for 3 days, then 30mg  daily for 3 days, then 20mg  daily for 3 days, then 10mg  daily for 3 days, then stop He needs to call our office next week to let us know how he is progressing. If he is not improving that he needs to be seen in office

## 2014-12-26 NOTE — Telephone Encounter (Signed)
Patient says that his chest has started filling up the past 2 days with phlegm.  Took Cipro a week ago and it didn't clear it up.  Coughing up yellow phlegm.  No fever.  Chest feels tight.  Pharmacy: [facility]  To [place] in [place] absence.

## 2015-01-02 ENCOUNTER — Other Ambulatory Visit: Payer: Self-pay | Admitting: Internal Medicine

## 2015-01-02 IMAGING — CR DG CHEST 2V
2 series · 2 of 2 positions shown · non-contrast
Comparison: 03/22/2014.

CLINICAL DATA: History of coronary artery disease and
bronchiectasis. Followup Hd Sobuz. History of asthma.

EXAM:
CHEST  2 VIEW

[view not recorded (1 of 2)]
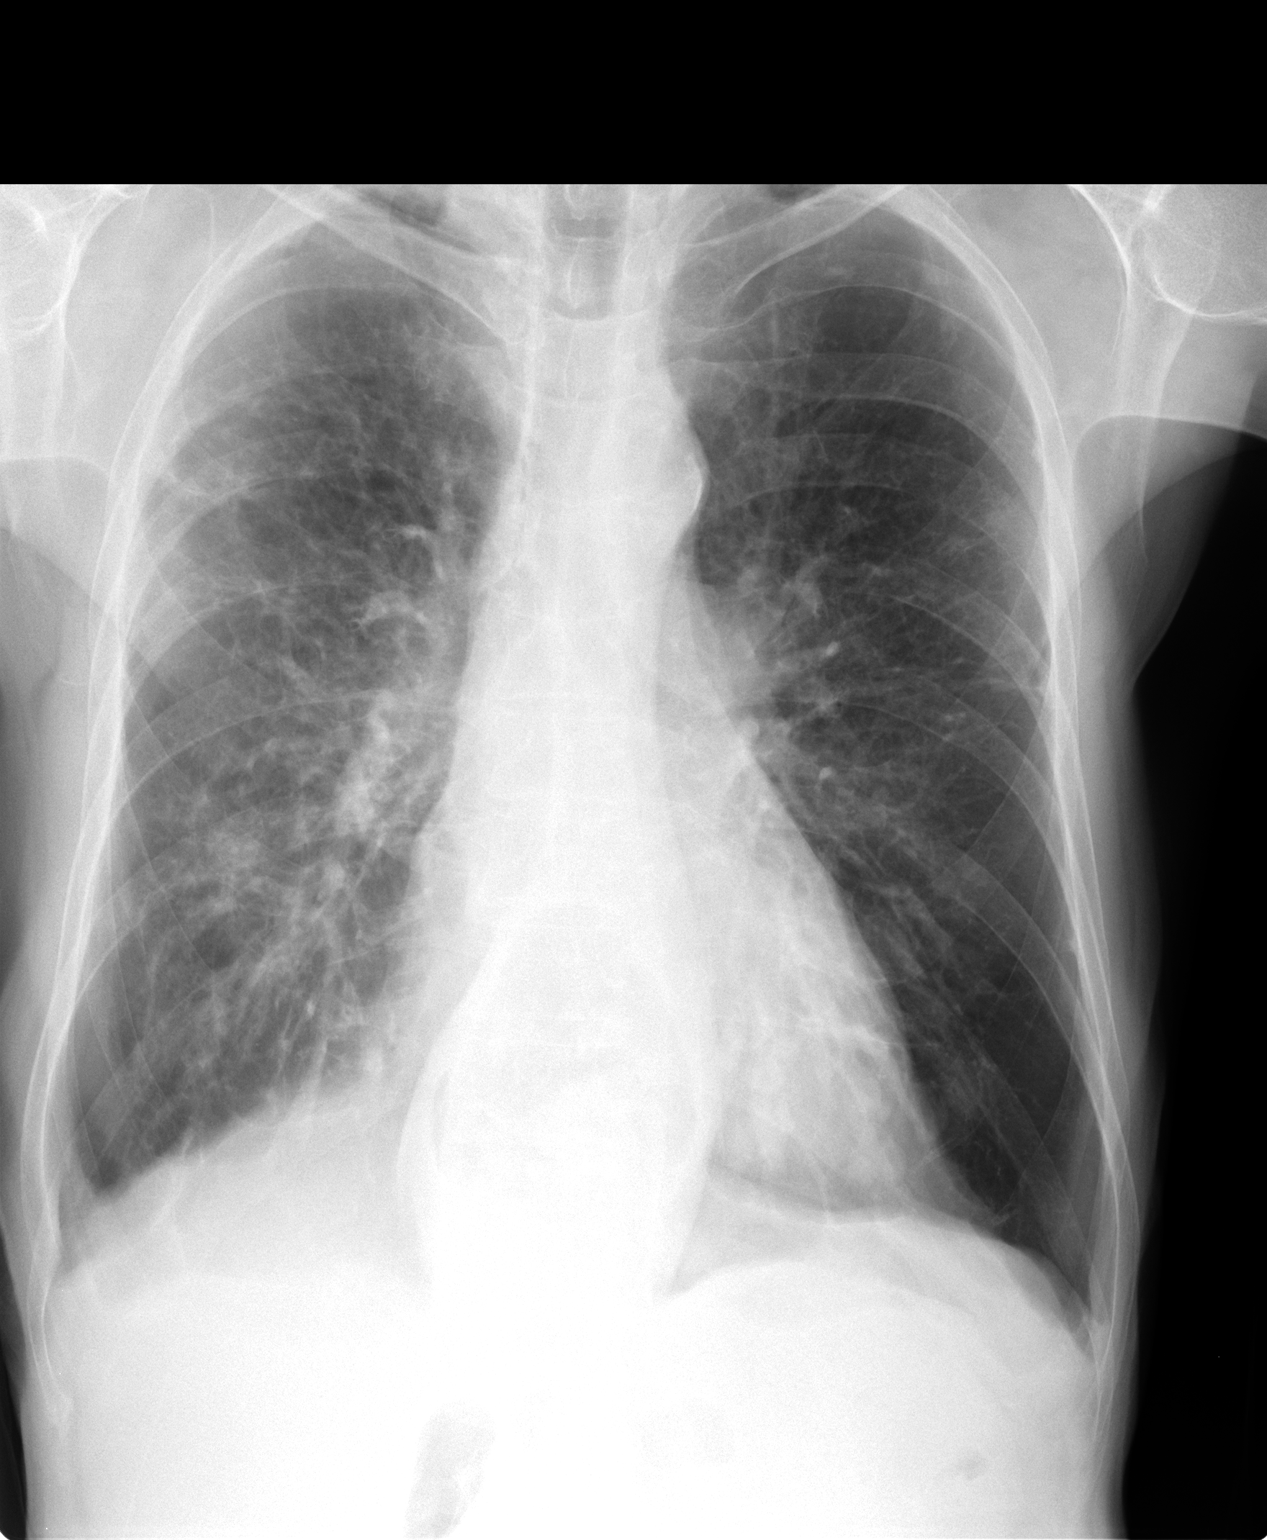

[view not recorded (2 of 2)]
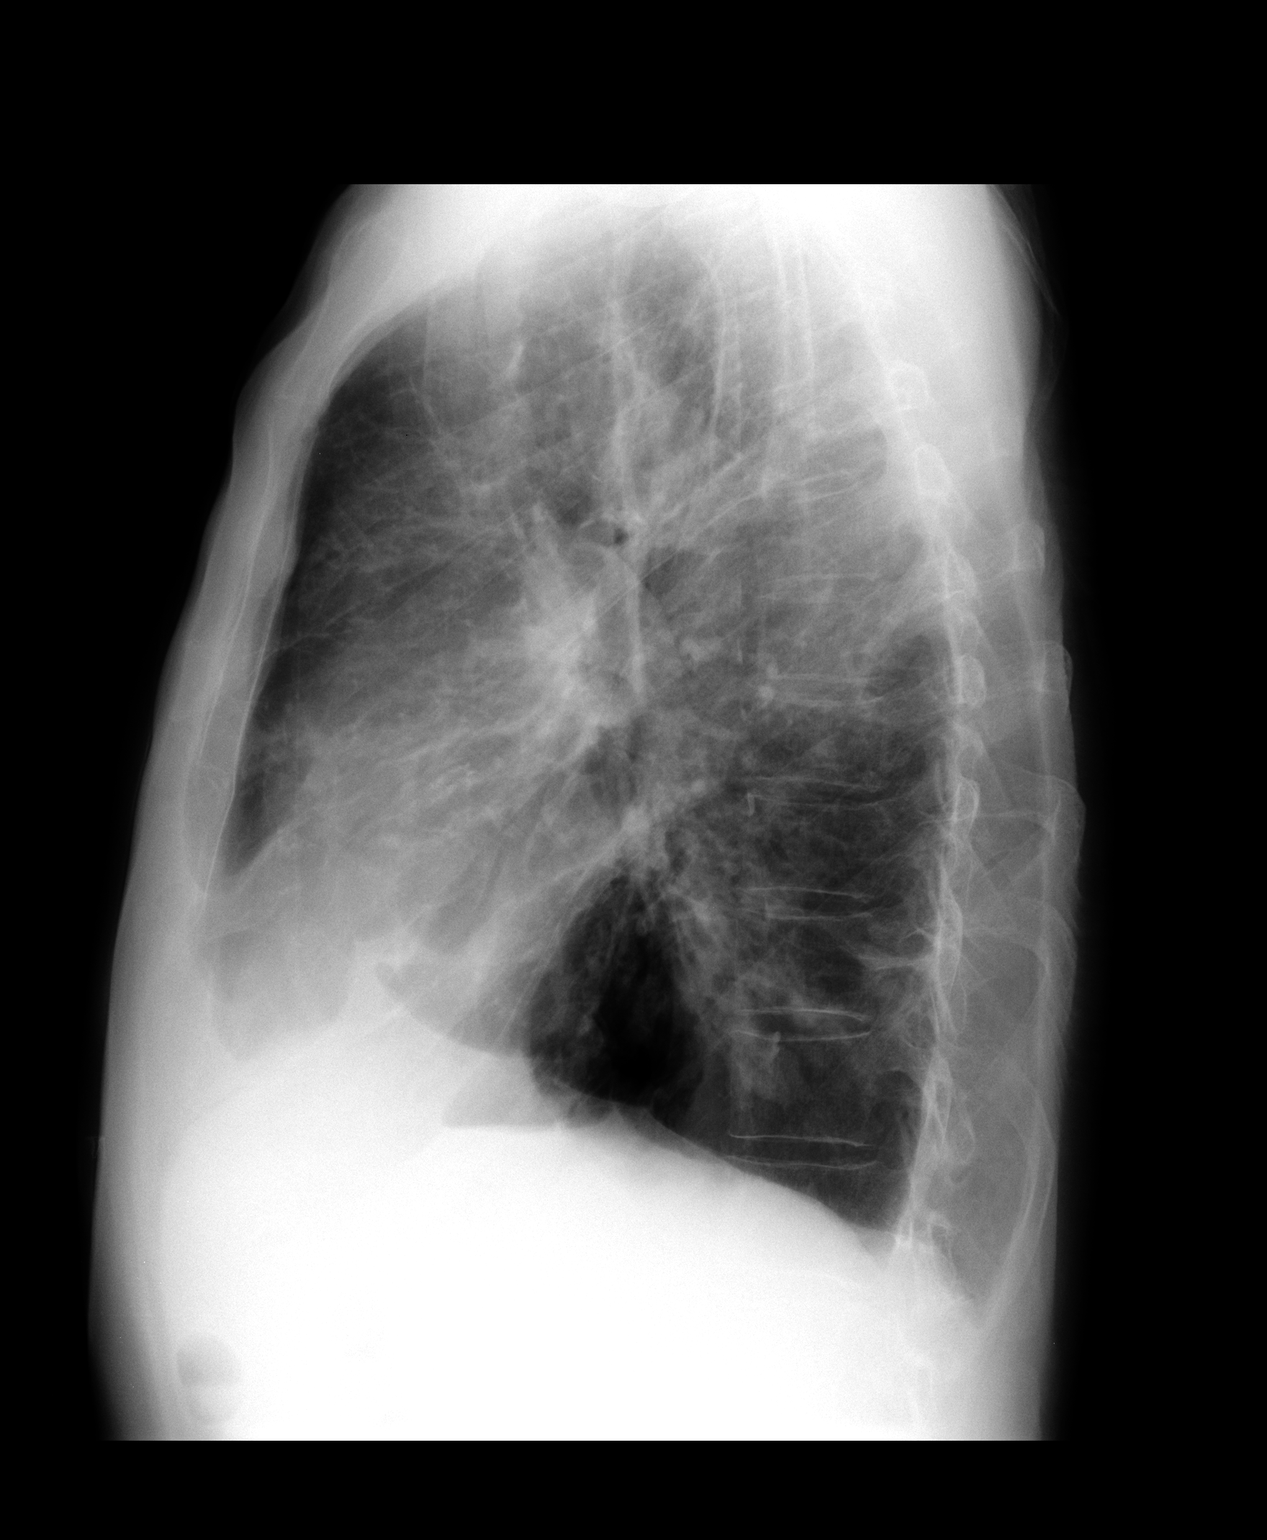

[2 of 2 positions shown; findings below may reference images not displayed]

FINDINGS: The heart size is normal. There is a moderate size hiatal hernia
identified. The lung volumes are increased. There is diffuse
increased interstitial markings, bronchiectasis and interstitial
cavitation compatible with the history of Wegener's granulomatosis.
When compared with the previous exam there has been no significant
change in the appearance of the lungs. No superimposed airspace
consolidation.
IMPRESSION: 1. Stable exam. No change in bilateral interstitial opacities,
bronchiectasis and cavitation secondary to Wegener's granulomatosis.

## 2015-01-06 ENCOUNTER — Encounter (HOSPITAL_COMMUNITY)
Admission: RE | Admit: 2015-01-06 | Discharge: 2015-01-06 | Disposition: A | Discharge status: Home / Self Care | Payer: Medicare Other | Source: Ambulatory Visit | Attending: Nephrology | Admitting: Nephrology

## 2015-01-06 DIAGNOSIS — D631 Anemia in chronic kidney disease: Secondary | ICD-10-CM | POA: Diagnosis not present

## 2015-01-06 LAB — POCT HEMOGLOBIN-HEMACUE: HEMOGLOBIN: 11.1 g/dL — AB (ref 13.0–17.0)

## 2015-01-06 MED ORDER — EPOETIN ALFA 10000 UNIT/ML IJ SOLN
INTRAMUSCULAR | Status: AC
  Administered 2015-01-06: 10000 [IU] via SUBCUTANEOUS
  Filled 2015-01-06: qty 1

## 2015-01-06 MED ORDER — EPOETIN ALFA 20000 UNIT/ML IJ SOLN
INTRAMUSCULAR | Status: AC
  Administered 2015-01-06: 20000 [IU] via SUBCUTANEOUS
  Filled 2015-01-06: qty 1

## 2015-01-06 MED ORDER — EPOETIN ALFA 40000 UNIT/ML IJ SOLN: 30000.0000 [IU] | INTRAMUSCULAR | Status: DC

## 2015-01-13 ENCOUNTER — Encounter (HOSPITAL_COMMUNITY)
Admission: RE | Admit: 2015-01-13 | Discharge: 2015-01-13 | Disposition: A | Discharge status: Home / Self Care | Payer: Medicare Other | Source: Ambulatory Visit | Attending: Nephrology | Admitting: Nephrology

## 2015-01-13 DIAGNOSIS — N184 Chronic kidney disease, stage 4 (severe): Secondary | ICD-10-CM | POA: Insufficient documentation

## 2015-01-13 DIAGNOSIS — D631 Anemia in chronic kidney disease: Secondary | ICD-10-CM | POA: Diagnosis present

## 2015-01-13 LAB — IRON AND TIBC
Iron: 75 ug/dL (ref 45–182)
Saturation Ratios: 39 % (ref 17.9–39.5)
TIBC: 193 ug/dL — ABNORMAL LOW (ref 250–450)
UIBC: 118 ug/dL

## 2015-01-13 LAB — FERRITIN: Ferritin: 692 ng/mL — ABNORMAL HIGH (ref 24–336)

## 2015-01-13 LAB — POCT HEMOGLOBIN-HEMACUE: Hemoglobin: 11.7 g/dL — ABNORMAL LOW (ref 13.0–17.0)

## 2015-01-13 MED ORDER — EPOETIN ALFA 10000 UNIT/ML IJ SOLN
INTRAMUSCULAR | Status: AC
  Administered 2015-01-13: 10000 [IU] via SUBCUTANEOUS
  Filled 2015-01-13: qty 1

## 2015-01-13 MED ORDER — EPOETIN ALFA 40000 UNIT/ML IJ SOLN: 30000.0000 [IU] | INTRAMUSCULAR | Status: DC

## 2015-01-13 MED ORDER — EPOETIN ALFA 20000 UNIT/ML IJ SOLN
INTRAMUSCULAR | Status: AC
  Administered 2015-01-13: 20000 [IU] via SUBCUTANEOUS
  Filled 2015-01-13: qty 1

## 2015-01-20 ENCOUNTER — Encounter (HOSPITAL_COMMUNITY)
Admission: RE | Admit: 2015-01-20 | Discharge: 2015-01-20 | Disposition: A | Discharge status: Home / Self Care | Payer: Medicare Other | Source: Ambulatory Visit | Attending: Nephrology | Admitting: Nephrology

## 2015-01-20 DIAGNOSIS — D631 Anemia in chronic kidney disease: Secondary | ICD-10-CM | POA: Diagnosis not present

## 2015-01-20 LAB — POCT HEMOGLOBIN-HEMACUE: Hemoglobin: 11.4 g/dL — ABNORMAL LOW (ref 13.0–17.0)

## 2015-01-20 MED ORDER — EPOETIN ALFA 40000 UNIT/ML IJ SOLN: 30000.0000 [IU] | INTRAMUSCULAR | Status: DC

## 2015-01-20 MED ORDER — EPOETIN ALFA 20000 UNIT/ML IJ SOLN
INTRAMUSCULAR | Status: AC
  Administered 2015-01-20: 20000 [IU] via SUBCUTANEOUS
  Filled 2015-01-20: qty 1

## 2015-01-20 MED ORDER — EPOETIN ALFA 10000 UNIT/ML IJ SOLN
INTRAMUSCULAR | Status: AC
  Administered 2015-01-20: 10000 [IU] via SUBCUTANEOUS
  Filled 2015-01-20: qty 1

## 2015-01-23 ENCOUNTER — Encounter: Payer: Self-pay | Admitting: Family Medicine

## 2015-01-23 ENCOUNTER — Ambulatory Visit (INDEPENDENT_AMBULATORY_CARE_PROVIDER_SITE_OTHER): Payer: Medicare Other | Admitting: Family Medicine

## 2015-01-23 VITALS — BP 113/65 | HR 94 | Temp 98.2°F | Ht 67.0 in | Wt 115.0 lb

## 2015-01-23 DIAGNOSIS — J209 Acute bronchitis, unspecified: Secondary | ICD-10-CM | POA: Diagnosis not present

## 2015-01-23 MED ORDER — AZITHROMYCIN 250 MG PO TABS: ORAL_TABLET | ORAL | Status: DC

## 2015-01-23 NOTE — Progress Notes (Signed)
   Subjective:    Patient ID: Richard [name], male    DOB: [birth year], 79 y.o.   MRN: [id number]  HPI Here for 4 days of cest congestion and coughing up yellow sputum. No fever. He just finished a course of Levaquin 10 days ago.    Review of Systems  Constitutional: Negative.   HENT: Positive for congestion and postnasal drip. Negative for sinus pressure.   Eyes: Negative.   Respiratory: Positive for cough, chest tightness and shortness of breath.   Cardiovascular: Negative.        Objective:   Physical Exam  Constitutional: He appears well-developed and well-nourished.  HENT:  Right Ear: External ear normal.  Left Ear: External ear normal.  Nose: Nose normal.  Mouth/Throat: Oropharynx is clear and moist.  Eyes: Conjunctivae are normal.  Cardiovascular: Normal rate, regular rhythm, normal heart sounds and intact distal pulses.   Pulmonary/Chest: Effort normal. No respiratory distress. He has no rales.  Scattered wheezes and rhonchi  Lymphadenopathy:    He has no cervical adenopathy.          Assessment & Plan:  Bronchitis, treat with a Zpack

## 2015-01-23 NOTE — Progress Notes (Signed)
Pre visit review using our clinic review tool, if applicable. No additional management support is needed unless otherwise documented below in the visit note. 

## 2015-01-29 ENCOUNTER — Encounter (HOSPITAL_COMMUNITY)
Admission: RE | Admit: 2015-01-29 | Discharge: 2015-01-29 | Disposition: A | Discharge status: Home / Self Care | Payer: Medicare Other | Source: Ambulatory Visit | Attending: Nephrology | Admitting: Nephrology

## 2015-01-29 DIAGNOSIS — N183 Chronic kidney disease, stage 3 (moderate): Secondary | ICD-10-CM | POA: Insufficient documentation

## 2015-01-29 DIAGNOSIS — Z79899 Other long term (current) drug therapy: Secondary | ICD-10-CM | POA: Diagnosis not present

## 2015-01-29 DIAGNOSIS — D631 Anemia in chronic kidney disease: Secondary | ICD-10-CM | POA: Diagnosis not present

## 2015-01-29 DIAGNOSIS — Z5181 Encounter for therapeutic drug level monitoring: Secondary | ICD-10-CM | POA: Diagnosis not present

## 2015-01-29 LAB — POCT HEMOGLOBIN-HEMACUE: Hemoglobin: 12 g/dL — ABNORMAL LOW (ref 13.0–17.0)

## 2015-01-29 MED ORDER — EPOETIN ALFA 40000 UNIT/ML IJ SOLN: 30000.0000 [IU] | INTRAMUSCULAR | Status: DC

## 2015-02-02 ENCOUNTER — Other Ambulatory Visit: Payer: Self-pay

## 2015-02-05 ENCOUNTER — Telehealth: Payer: Self-pay | Admitting: Family Medicine

## 2015-02-05 NOTE — Telephone Encounter (Signed)
Pt was given z pak on 6/17. Felt better a couple of days, but congestion is back. Pt would like to know if yoi will prescribe Something in the doxycycline line. That has worked in the past. Neighborhood [facility]  Pt aware dr fry has gonet for the day

## 2015-02-06 MED ORDER — DOXYCYCLINE HYCLATE 100 MG PO TABS: 100.0000 mg | ORAL_TABLET | Freq: Two times a day (BID) | ORAL | Status: DC

## 2015-02-06 NOTE — Telephone Encounter (Signed)
I sent script e-scribe and spoke with pt. 

## 2015-02-06 NOTE — Telephone Encounter (Signed)
Call in Doxycycline 100 mg bid for 10 days  

## 2015-02-10 ENCOUNTER — Encounter (HOSPITAL_COMMUNITY)
Admission: RE | Admit: 2015-02-10 | Discharge: 2015-02-10 | Disposition: A | Discharge status: Home / Self Care | Payer: Medicare Other | Source: Ambulatory Visit | Attending: Nephrology | Admitting: Nephrology

## 2015-02-10 ENCOUNTER — Other Ambulatory Visit: Payer: Self-pay | Admitting: Internal Medicine

## 2015-02-10 DIAGNOSIS — Z5181 Encounter for therapeutic drug level monitoring: Secondary | ICD-10-CM | POA: Insufficient documentation

## 2015-02-10 DIAGNOSIS — D631 Anemia in chronic kidney disease: Secondary | ICD-10-CM | POA: Diagnosis not present

## 2015-02-10 DIAGNOSIS — Z79899 Other long term (current) drug therapy: Secondary | ICD-10-CM | POA: Insufficient documentation

## 2015-02-10 DIAGNOSIS — N183 Chronic kidney disease, stage 3 (moderate): Secondary | ICD-10-CM | POA: Insufficient documentation

## 2015-02-10 LAB — IRON AND TIBC
Iron: 106 ug/dL (ref 45–182)
Saturation Ratios: 52 % — ABNORMAL HIGH (ref 17.9–39.5)
TIBC: 203 ug/dL — AB (ref 250–450)
UIBC: 97 ug/dL

## 2015-02-10 LAB — POCT HEMOGLOBIN-HEMACUE: Hemoglobin: 11.7 g/dL — ABNORMAL LOW (ref 13.0–17.0)

## 2015-02-10 LAB — FERRITIN: FERRITIN: 549 ng/mL — AB (ref 24–336)

## 2015-02-10 MED ORDER — EPOETIN ALFA 20000 UNIT/ML IJ SOLN
INTRAMUSCULAR | Status: AC
  Administered 2015-02-10: 20000 [IU] via SUBCUTANEOUS
  Filled 2015-02-10: qty 1

## 2015-02-10 MED ORDER — EPOETIN ALFA 40000 UNIT/ML IJ SOLN: 30000.0000 [IU] | INTRAMUSCULAR | Status: DC

## 2015-02-10 MED ORDER — EPOETIN ALFA 10000 UNIT/ML IJ SOLN
INTRAMUSCULAR | Status: AC
  Administered 2015-02-10: 10000 [IU] via SUBCUTANEOUS
  Filled 2015-02-10: qty 1

## 2015-02-10 NOTE — Telephone Encounter (Signed)
Rx(s) sent to pharmacy electronically.  

## 2015-02-17 ENCOUNTER — Encounter (HOSPITAL_COMMUNITY)
Admission: RE | Admit: 2015-02-17 | Discharge: 2015-02-17 | Disposition: A | Discharge status: Home / Self Care | Payer: Medicare Other | Source: Ambulatory Visit | Attending: Nephrology | Admitting: Nephrology

## 2015-02-17 DIAGNOSIS — N183 Chronic kidney disease, stage 3 (moderate): Secondary | ICD-10-CM | POA: Diagnosis not present

## 2015-02-17 LAB — POCT HEMOGLOBIN-HEMACUE: Hemoglobin: 11.2 g/dL — ABNORMAL LOW (ref 13.0–17.0)

## 2015-02-17 MED ORDER — EPOETIN ALFA 20000 UNIT/ML IJ SOLN
INTRAMUSCULAR | Status: AC
  Administered 2015-02-17: 20000 [IU]
  Filled 2015-02-17: qty 1

## 2015-02-17 MED ORDER — EPOETIN ALFA 10000 UNIT/ML IJ SOLN
INTRAMUSCULAR | Status: AC
  Administered 2015-02-17: 10000 [IU]
  Filled 2015-02-17: qty 1

## 2015-02-17 MED ORDER — EPOETIN ALFA 40000 UNIT/ML IJ SOLN: 30000.0000 [IU] | INTRAMUSCULAR | Status: DC

## 2015-02-24 ENCOUNTER — Encounter (HOSPITAL_COMMUNITY)
Admission: RE | Admit: 2015-02-24 | Discharge: 2015-02-24 | Disposition: A | Discharge status: Home / Self Care | Payer: Medicare Other | Source: Ambulatory Visit | Attending: Nephrology | Admitting: Nephrology

## 2015-02-24 DIAGNOSIS — N183 Chronic kidney disease, stage 3 (moderate): Secondary | ICD-10-CM | POA: Diagnosis not present

## 2015-02-24 LAB — POCT HEMOGLOBIN-HEMACUE: Hemoglobin: 11.5 g/dL — ABNORMAL LOW (ref 13.0–17.0)

## 2015-02-24 IMAGING — US US BIOPSY
1 series · 10 of 10 positions shown · non-contrast
Comparison: none

[Series 1: us biopsy · 0.18mm/px · 10 acquisitions, 10 frames shown]
[im 1/10]
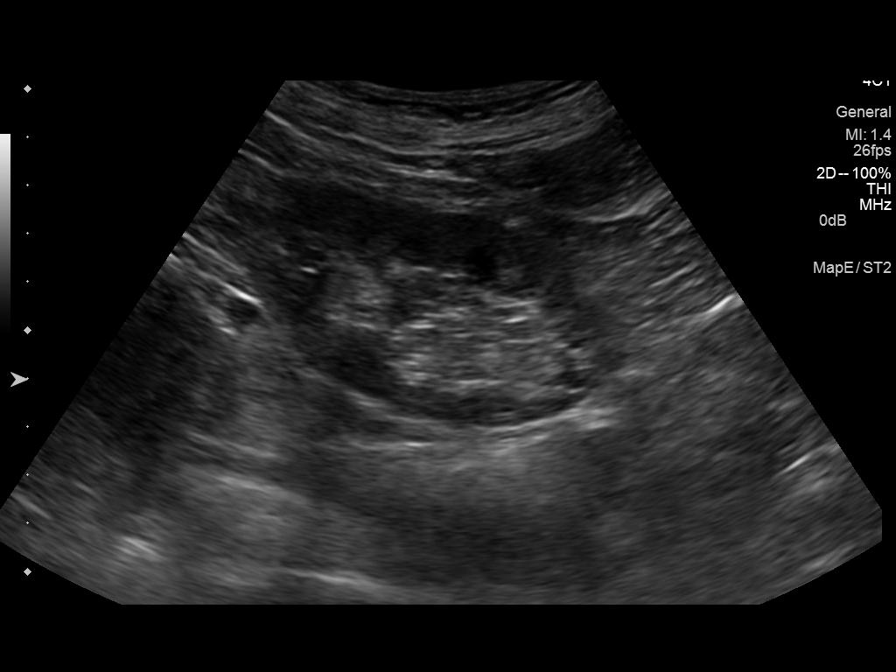
[im 2/10]
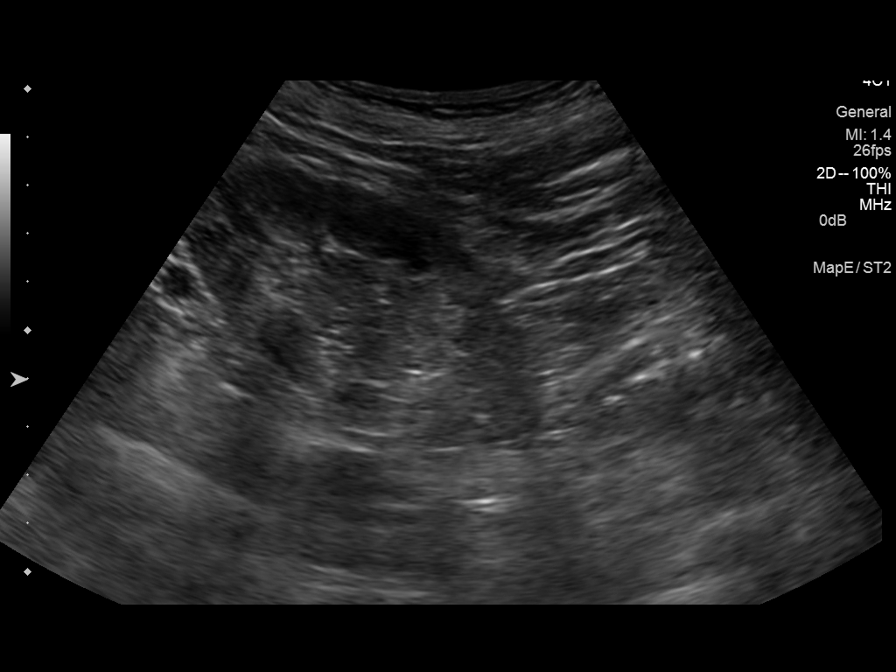
[im 3/10]
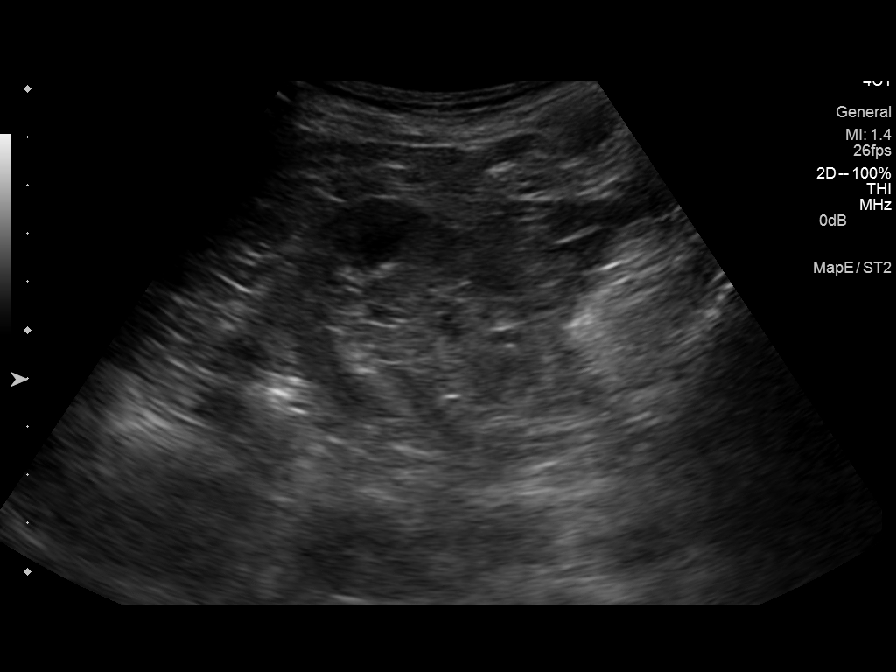
[im 4/10]
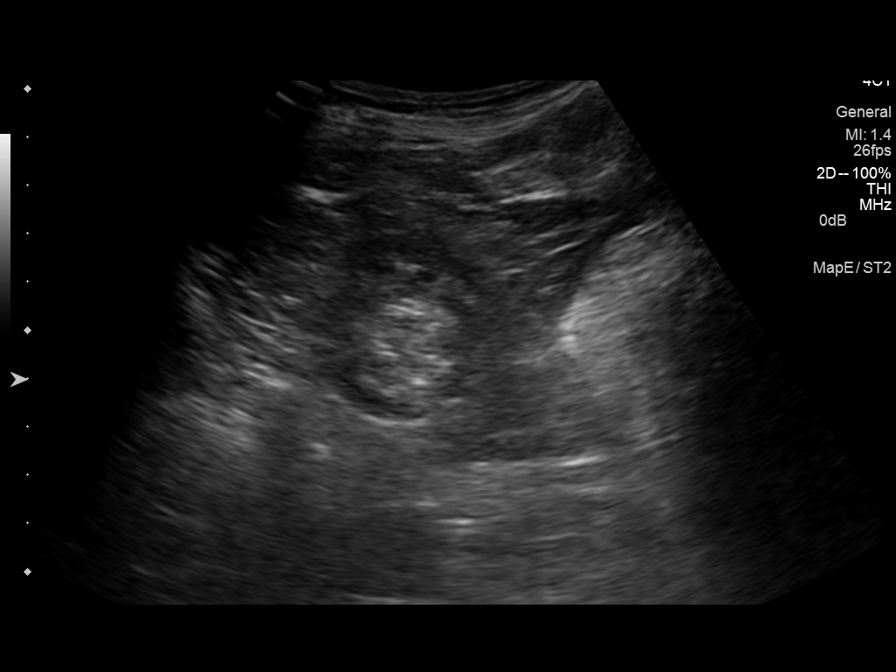
[im 5/10]
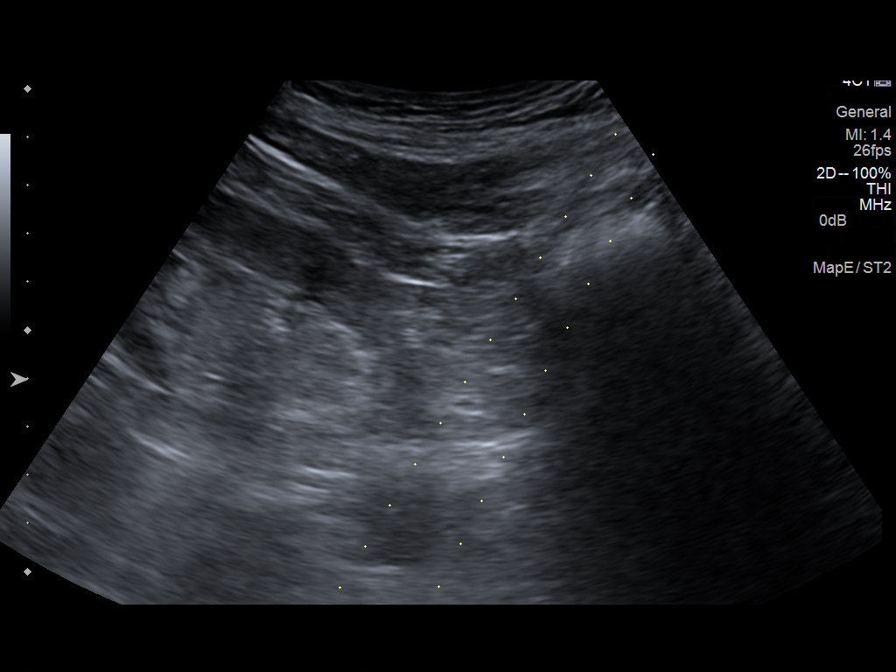
[im 6/10]
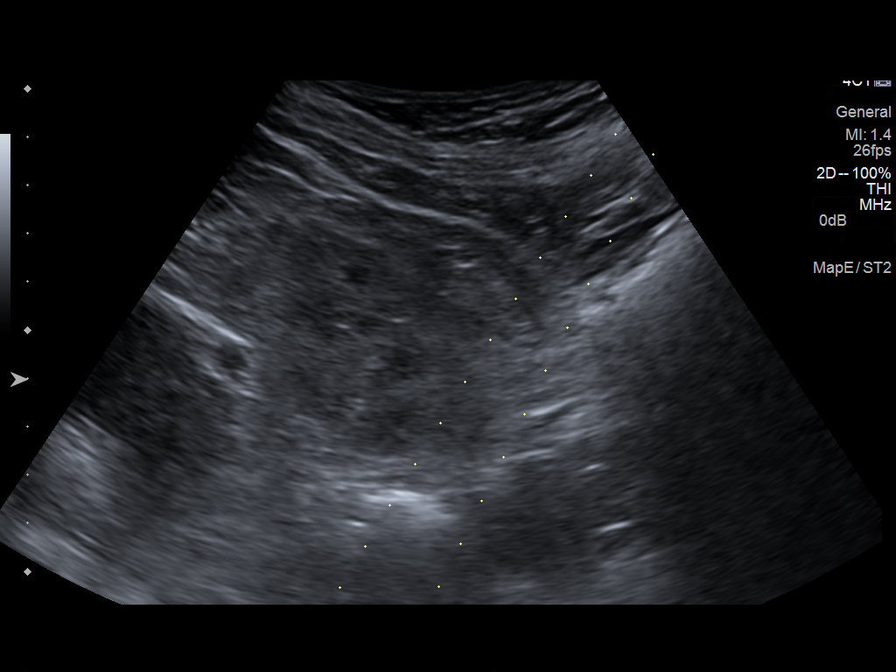
[im 7/10]
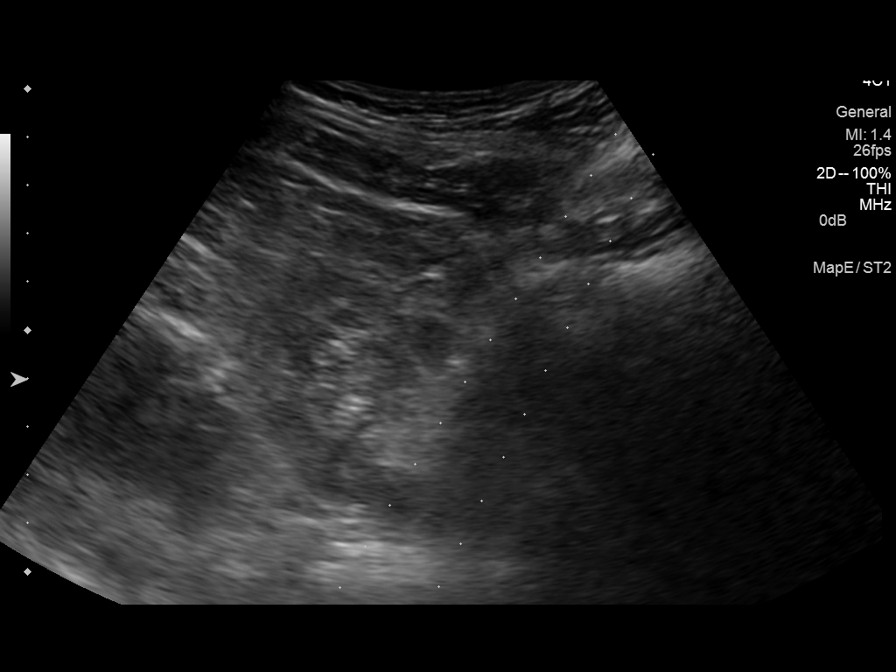
[im 8/10]
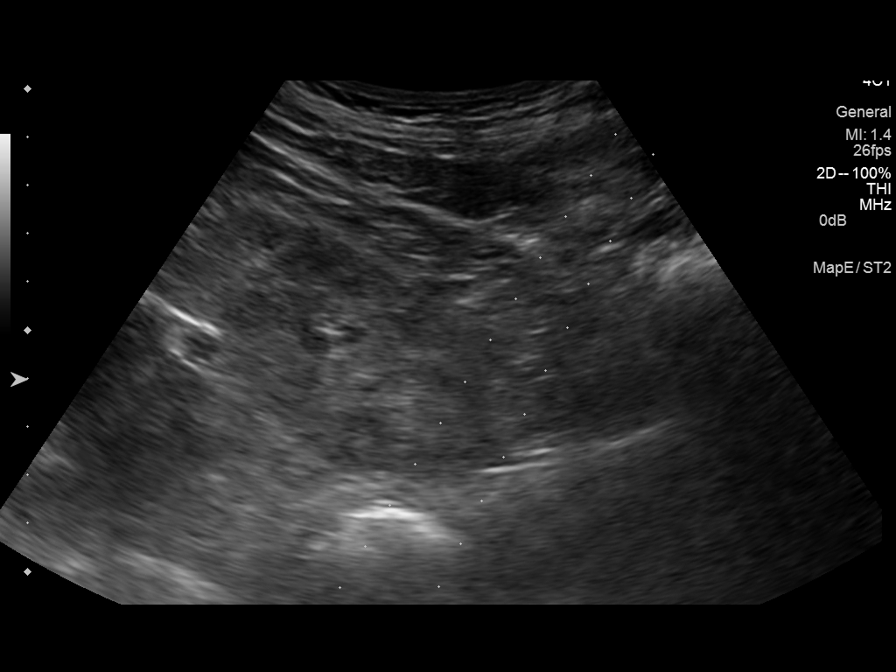
[im 9/10]
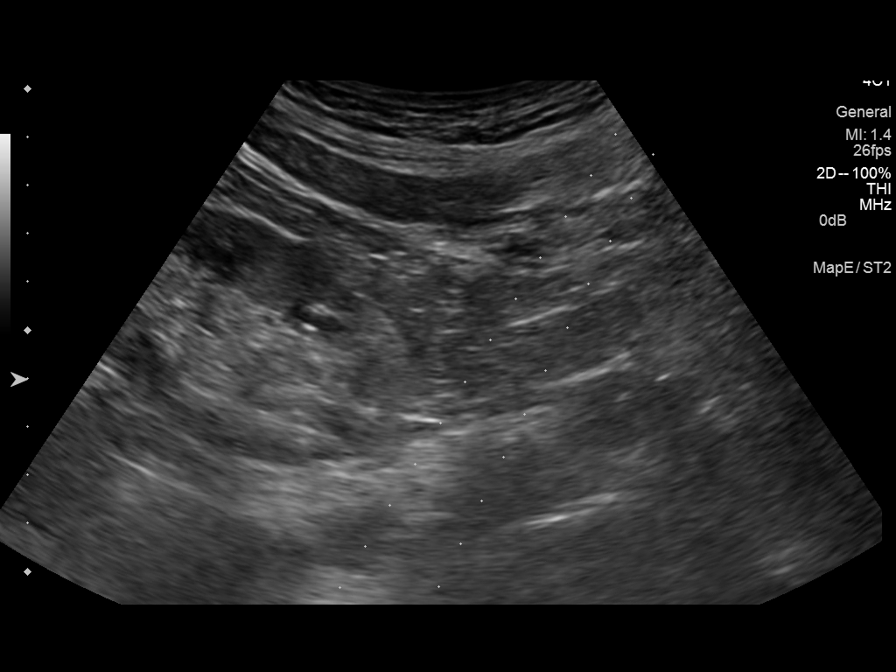
[im 10/10]
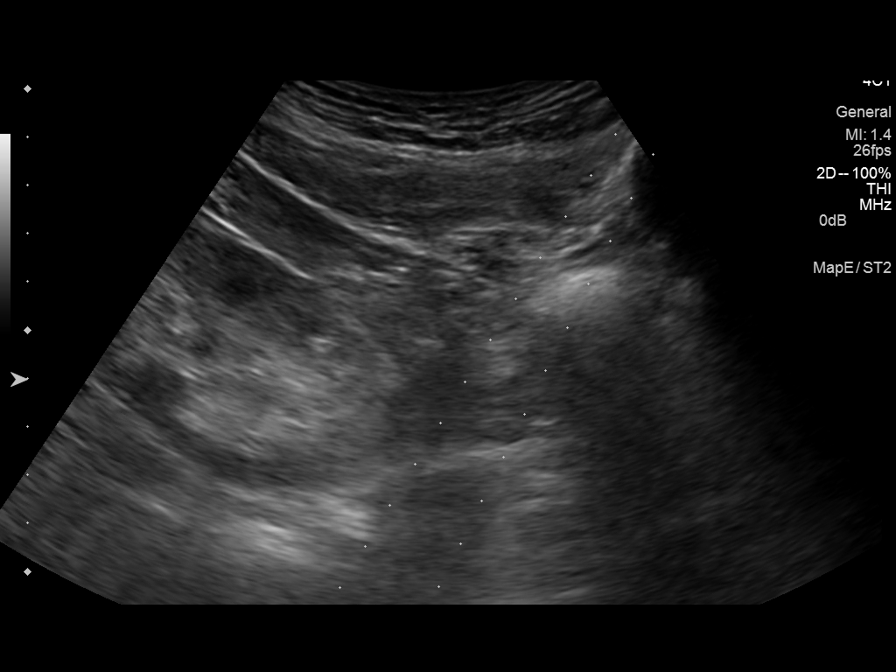

[10 of 10 positions shown; findings below may reference images not displayed]

Canned report from images found in remote index.

Refer to host system for actual result text.

## 2015-02-24 MED ORDER — EPOETIN ALFA 40000 UNIT/ML IJ SOLN: 30000.0000 [IU] | INTRAMUSCULAR | Status: DC

## 2015-02-24 MED ORDER — EPOETIN ALFA 20000 UNIT/ML IJ SOLN
INTRAMUSCULAR | Status: AC
  Administered 2015-02-24: 20000 [IU]
  Filled 2015-02-24: qty 1

## 2015-02-24 MED ORDER — EPOETIN ALFA 10000 UNIT/ML IJ SOLN
INTRAMUSCULAR | Status: AC
  Administered 2015-02-24: 10000 [IU]
  Filled 2015-02-24: qty 1

## 2015-03-02 ENCOUNTER — Other Ambulatory Visit: Payer: Self-pay | Admitting: Internal Medicine

## 2015-03-03 ENCOUNTER — Inpatient Hospital Stay (HOSPITAL_COMMUNITY): Admission: RE | Admit: 2015-03-03 | Payer: Medicare Other | Source: Ambulatory Visit

## 2015-03-03 ENCOUNTER — Other Ambulatory Visit (HOSPITAL_COMMUNITY): Payer: Self-pay | Admitting: *Deleted

## 2015-03-04 ENCOUNTER — Other Ambulatory Visit: Payer: Self-pay | Admitting: Internal Medicine

## 2015-03-10 ENCOUNTER — Encounter (HOSPITAL_COMMUNITY)
Admission: RE | Admit: 2015-03-10 | Discharge: 2015-03-10 | Disposition: A | Discharge status: Home / Self Care | Payer: Medicare Other | Source: Ambulatory Visit | Attending: Nephrology | Admitting: Nephrology

## 2015-03-10 DIAGNOSIS — N183 Chronic kidney disease, stage 3 (moderate): Secondary | ICD-10-CM | POA: Diagnosis not present

## 2015-03-10 DIAGNOSIS — Z5181 Encounter for therapeutic drug level monitoring: Secondary | ICD-10-CM | POA: Diagnosis not present

## 2015-03-10 DIAGNOSIS — Z79899 Other long term (current) drug therapy: Secondary | ICD-10-CM | POA: Diagnosis not present

## 2015-03-10 DIAGNOSIS — D631 Anemia in chronic kidney disease: Secondary | ICD-10-CM | POA: Insufficient documentation

## 2015-03-10 LAB — POCT HEMOGLOBIN-HEMACUE: Hemoglobin: 11 g/dL — ABNORMAL LOW (ref 13.0–17.0)

## 2015-03-10 MED ORDER — EPOETIN ALFA 40000 UNIT/ML IJ SOLN: 30000.0000 [IU] | INTRAMUSCULAR | Status: DC

## 2015-03-10 MED ORDER — EPOETIN ALFA 10000 UNIT/ML IJ SOLN
INTRAMUSCULAR | Status: AC
  Administered 2015-03-10: 10000 [IU] via SUBCUTANEOUS
  Filled 2015-03-10: qty 1

## 2015-03-10 MED ORDER — EPOETIN ALFA 20000 UNIT/ML IJ SOLN
INTRAMUSCULAR | Status: AC
  Administered 2015-03-10: 20000 [IU] via SUBCUTANEOUS
  Filled 2015-03-10: qty 1

## 2015-03-20 ENCOUNTER — Telehealth: Payer: Self-pay | Admitting: Internal Medicine

## 2015-03-20 NOTE — Telephone Encounter (Signed)
Patient states he has been having some shortness of breath for a little while now.  He has an appt with Dr. Debara Pickett 04/03/15.  Please call.

## 2015-03-20 NOTE — Telephone Encounter (Signed)
Returned call to patient.He stated he has been sob and worse the past 2 weeks.No chest pain.Stated he cannot do any physical activity without being sob.Stated he feels like he did before he had stents.Advised Dr.Hilty's schedule is full.Flex appointments full 8/15 and 8/16.Appointment scheduled with Dr.Kelly Tue 8/16 at 3:45 pm.Advised to go to ER if sob worsens.

## 2015-03-23 ENCOUNTER — Other Ambulatory Visit: Payer: Self-pay | Admitting: Family Medicine

## 2015-03-24 ENCOUNTER — Ambulatory Visit (INDEPENDENT_AMBULATORY_CARE_PROVIDER_SITE_OTHER): Payer: Medicare Other | Admitting: Cardiovascular Disease

## 2015-03-24 ENCOUNTER — Encounter (HOSPITAL_COMMUNITY)
Admission: RE | Admit: 2015-03-24 | Discharge: 2015-03-24 | Disposition: A | Discharge status: Home / Self Care | Payer: Medicare Other | Source: Ambulatory Visit | Attending: Nephrology | Admitting: Nephrology

## 2015-03-24 VITALS — BP 96/68 | HR 93 | Ht 67.0 in | Wt 116.4 lb

## 2015-03-24 DIAGNOSIS — E785 Hyperlipidemia, unspecified: Secondary | ICD-10-CM

## 2015-03-24 DIAGNOSIS — M3131 Wegener's granulomatosis with renal involvement: Secondary | ICD-10-CM

## 2015-03-24 DIAGNOSIS — R011 Cardiac murmur, unspecified: Secondary | ICD-10-CM

## 2015-03-24 DIAGNOSIS — N183 Chronic kidney disease, stage 3 (moderate): Secondary | ICD-10-CM | POA: Diagnosis not present

## 2015-03-24 DIAGNOSIS — R0609 Other forms of dyspnea: Secondary | ICD-10-CM | POA: Diagnosis not present

## 2015-03-24 DIAGNOSIS — R0602 Shortness of breath: Secondary | ICD-10-CM

## 2015-03-24 DIAGNOSIS — Z79899 Other long term (current) drug therapy: Secondary | ICD-10-CM | POA: Diagnosis not present

## 2015-03-24 DIAGNOSIS — J479 Bronchiectasis, uncomplicated: Secondary | ICD-10-CM

## 2015-03-24 DIAGNOSIS — R531 Weakness: Secondary | ICD-10-CM

## 2015-03-24 DIAGNOSIS — I25810 Atherosclerosis of coronary artery bypass graft(s) without angina pectoris: Secondary | ICD-10-CM | POA: Diagnosis not present

## 2015-03-24 DIAGNOSIS — N184 Chronic kidney disease, stage 4 (severe): Secondary | ICD-10-CM

## 2015-03-24 LAB — FERRITIN: FERRITIN: 406 ng/mL — AB (ref 24–336)

## 2015-03-24 LAB — IRON AND TIBC
IRON: 53 ug/dL (ref 45–182)
SATURATION RATIOS: 25 % (ref 17.9–39.5)
TIBC: 210 ug/dL — ABNORMAL LOW (ref 250–450)
UIBC: 157 ug/dL

## 2015-03-24 LAB — POCT HEMOGLOBIN-HEMACUE: Hemoglobin: 8.9 g/dL — ABNORMAL LOW (ref 13.0–17.0)

## 2015-03-24 MED ORDER — CLONIDINE HCL 0.1 MG PO TABS
0.1000 mg | ORAL_TABLET | Freq: Once | ORAL | Status: AC | PRN
Start: 2015-03-24 — End: 2015-03-24

## 2015-03-24 MED ORDER — ISOSORBIDE MONONITRATE ER 30 MG PO TB24: 30.0000 mg | ORAL_TABLET | Freq: Every day | ORAL | Status: DC

## 2015-03-24 MED ORDER — SIMVASTATIN 20 MG PO TABS: 20.0000 mg | ORAL_TABLET | Freq: Every day | ORAL | Status: DC

## 2015-03-24 MED ORDER — NITROGLYCERIN 0.4 MG SL SUBL: 0.4000 mg | SUBLINGUAL_TABLET | SUBLINGUAL | Status: AC | PRN

## 2015-03-24 MED ORDER — EPOETIN ALFA 20000 UNIT/ML IJ SOLN
INTRAMUSCULAR | Status: AC
  Administered 2015-03-24: 20000 [IU] via SUBCUTANEOUS
  Filled 2015-03-24: qty 1

## 2015-03-24 MED ORDER — EPOETIN ALFA 40000 UNIT/ML IJ SOLN: 30000.0000 [IU] | INTRAMUSCULAR | Status: DC

## 2015-03-24 MED ORDER — METOPROLOL SUCCINATE ER 25 MG PO TB24: 25.0000 mg | ORAL_TABLET | Freq: Every day | ORAL | Status: DC

## 2015-03-24 MED ORDER — EPOETIN ALFA 10000 UNIT/ML IJ SOLN
INTRAMUSCULAR | Status: AC
  Administered 2015-03-24: 10000 [IU] via SUBCUTANEOUS
  Filled 2015-03-24: qty 1

## 2015-03-24 NOTE — Patient Instructions (Signed)
Your physician recommends that you return for lab work fasting this week.  Your physician has requested that you have a lexiscan myoview. For further information please visit [email or web address]. Please follow instruction sheet, as given. ( this week)  Your physician has requested that you have an echocardiogram. Echocardiography is a painless test that uses sound waves to create images of your heart. It provides your doctor with information about the size and shape of your heart and how well your heart's chambers and valves are working. This procedure takes approximately one hour. There are no restrictions for this procedure. ( this week)  Your physician has recommended you make the following change in your medication:   1.) increase the metoprolol succ 25 mg to 1 tablet daily from 1/2 tablet daily.  2.) start new prescription for isosorbide. [facility].  Your physician recommends that you schedule a follow-up appointment in: keep scheduled appointment with Dr. Debara Pickett.

## 2015-03-26 ENCOUNTER — Encounter: Payer: Self-pay | Admitting: Cardiovascular Disease

## 2015-03-26 ENCOUNTER — Telehealth: Payer: Self-pay | Admitting: Internal Medicine

## 2015-03-26 DIAGNOSIS — N184 Chronic kidney disease, stage 4 (severe): Secondary | ICD-10-CM | POA: Insufficient documentation

## 2015-03-26 DIAGNOSIS — I255 Ischemic cardiomyopathy: Secondary | ICD-10-CM

## 2015-03-26 LAB — CBC
HCT: 23.1 % — ABNORMAL LOW (ref 39.0–52.0)
Hemoglobin: 7.9 g/dL — ABNORMAL LOW (ref 13.0–17.0)
MCH: 36.4 pg — ABNORMAL HIGH (ref 26.0–34.0)
MCHC: 34.2 g/dL (ref 30.0–36.0)
MCV: 106.5 fL — AB (ref 78.0–100.0)
MPV: 9.2 fL (ref 8.6–12.4)
PLATELETS: 227 10*3/uL (ref 150–400)
RBC: 2.17 MIL/uL — ABNORMAL LOW (ref 4.22–5.81)
RDW: 14.7 % (ref 11.5–15.5)
WBC: 5.9 10*3/uL (ref 4.0–10.5)

## 2015-03-26 LAB — TSH: TSH: 4.228 u[IU]/mL (ref 0.350–4.500)

## 2015-03-26 LAB — COMPREHENSIVE METABOLIC PANEL
ALK PHOS: 82 U/L (ref 40–115)
ALT: 8 U/L — AB (ref 9–46)
AST: 12 U/L (ref 10–35)
Albumin: 3.3 g/dL — ABNORMAL LOW (ref 3.6–5.1)
BUN: 38 mg/dL — AB (ref 7–25)
CO2: 23 mmol/L (ref 20–31)
Calcium: 7.9 mg/dL — ABNORMAL LOW (ref 8.6–10.3)
Chloride: 107 mmol/L (ref 98–110)
Creat: 1.92 mg/dL — ABNORMAL HIGH (ref 0.70–1.18)
GLUCOSE: 98 mg/dL (ref 65–99)
Potassium: 4.1 mmol/L (ref 3.5–5.3)
Sodium: 142 mmol/L (ref 135–146)
Total Bilirubin: 0.4 mg/dL (ref 0.2–1.2)
Total Protein: 5.2 g/dL — ABNORMAL LOW (ref 6.1–8.1)

## 2015-03-26 LAB — LIPID PANEL
Cholesterol: 103 mg/dL — ABNORMAL LOW (ref 125–200)
HDL: 45 mg/dL (ref >=40)
LDL Cholesterol: 39 mg/dL (ref <130)
Total CHOL/HDL Ratio: 2.3 Ratio (ref <=5.0)
Triglycerides: 97 mg/dL (ref <150)
VLDL: 19 mg/dL (ref <30)

## 2015-03-26 NOTE — Telephone Encounter (Signed)
Crystal is calling to see if labs can be added to what is already ordered.

## 2015-03-26 NOTE — Telephone Encounter (Signed)
Received a call from [place] with [facility].Stated pt is scheduled to have lab done at [facility] lab 03/27/15.She requested to add a cbc due to low hgb 8.9 at office yesterday.Cbc order entered.She requested results be faxed to her at fax # [phone] attention Crystal.Message sent to Dr.Hilty's nurse Eliezer Lofts.

## 2015-03-26 NOTE — Progress Notes (Signed)
Patient ID: Richard [name], male   DOB: 1936/05/11, 79 y.o.   MRN: [id number]     HPI: Richard [name] is a 79 y.o. male who presents to the office today and is seen as an add-on for evaluation of increasing shortness of breath.  He is a patient of Dr. Debara Pickett.    Richard [name] has a history of Wegener's granulomatosis , as well as severe multifocal bronchiectasis.  He has coronary artery disease.  On March 17, 2014 he underwent successful 2 vessel coronary intervention performed by me with angiosculpt/bare-metal stenting of the proximal LAD with a 95% stenosis being reduced to 0% and PTCA of a chronically occluded circumflex marginal vessel.  Subsequently, he underwent staged PCI in 03/21/2014 with Rotablator/BMS stenting to the mid RCA and it was decided to medically manage his ramus intermediate stenosis.  He last saw Dr. Debara Pickett in December 2015.  He has stage IV kidney disease and is followed by Dr. Valentino Saxon.  He tells me he recently completed a course of Levaquin for a lung infection.  He has had issues with anemia.  Recently, he has had increasing episodes of exertional shortness of breath.  He denies any palpitations.  He denies presyncope or syncope.  He presents for evaluation.  Past Medical History  Diagnosis Date  . GERD (gastroesophageal reflux disease)   . Asthma   . Fungal infection     lungs  . Cataract   . BPH (benign prostatic hyperplasia)     sees Dr. Risa Grill, biopsy June 2015 was benign   . Wegener's granulomatosis     sees Dr. [name]   . Peptic stricture of esophagus   . Anemia   . Hiatal hernia   . Adenomatous colon polyp   . Bronchiectasis   . On home oxygen therapy     uses 2 l/m nasally at bedtime  . HOH (hard of hearing)     bilaterally  . HTN (hypertension) 09/28/2013  . CAD (coronary artery disease)     a. [place] s/p [place] x3 and ultimately BMS to pLAD& POBA to CTO of circumflex/marginal vessel on 03/17/14 and staged BMS to [facility] on 03/21/14  . Femoral artery  pseudo-aneurysm, right     a. s/p repair. Follow up with Dr. Kellie Simmering   . Diverticulosis     Past Surgical History  Procedure Laterality Date  . Hernia repair    . Eye surgery      cataracts removed.   . Colonoscopy  08-16-11    per Dr. Fuller Plan, diverticulosis and polyps, repeat in 5 yrs   . Esophagogastroduodenoscopy (egd) with esophageal dilation  11-29-10    per Dr. Fuller Plan   . Cataract extraction, bilateral  12-18-12    bilateral  . Cholecystectomy N/A 12/21/2012    Procedure: LAPAROSCOPIC CHOLECYSTECTOMY WITH INTRAOPERATIVE CHOLANGIOGRAM;  Surgeon: Edward Jolly, MD;  Location: [facility] ORS;  Service: General;  Laterality: N/A;  . Hematoma evacuation Right 03/19/2014    Procedure: Suture repair of femoral artery with evacuation of hematoma;  Surgeon: Mal Misty, MD;  Location: [facility] OR;  Service: Vascular;  Laterality: Right;  . Coronary angioplasty  03/2014  . Left heart catheterization with coronary angiogram N/A 03/14/2014    Procedure: LEFT HEART CATHETERIZATION WITH CORONARY ANGIOGRAM;  Surgeon: Leonie Man, MD;  Location: [facility] CATH LAB;  Service: Cardiovascular;  Laterality: N/A;  . Percutaneous coronary stent intervention (pci-s) N/A 03/17/2014    Procedure: PERCUTANEOUS CORONARY STENT INTERVENTION (PCI-S);  Surgeon: Joyice Faster  Claiborne Billings, MD;  Location: [facility] CATH LAB;  Service: Cardiovascular;  Laterality: N/A;  . Cardiac catheterization  03/17/2014    Procedure: CORONARY BALLOON ANGIOPLASTY;  Surgeon: Troy Sine, MD;  Location: [facility] CATH LAB;  Service: Cardiovascular;;  . Percutaneous coronary stent intervention (pci-s) N/A 03/21/2014    Procedure: PERCUTANEOUS CORONARY STENT INTERVENTION (PCI-S);  Surgeon: Burnell Blanks, MD;  Location: [facility] CATH LAB;  Service: Cardiovascular;  Laterality: N/A;  . Renal biopsy      No Known Allergies  Current Outpatient Prescriptions  Medication Sig Dispense Refill  . albuterol (PROVENTIL HFA;VENTOLIN HFA) 108 (90 BASE) MCG/ACT inhaler Inhale 2  puffs into the lungs every 4 (four) hours as needed for wheezing or shortness of breath.    Marland Kitchen aspirin EC 81 MG tablet Take 81 mg by mouth daily.    Marland Kitchen b complex vitamins tablet Take 1 tablet by mouth daily.      . bisacodyl (DULCOLAX) 5 MG EC tablet Take 5 mg by mouth 2 (two) times daily.     . budesonide-formoterol (SYMBICORT) 160-4.5 MCG/ACT inhaler Inhale 2 puffs into the lungs 2 (two) times daily.    . calcium citrate-vitamin D (CITRACAL+D) 315-200 MG-UNIT per tablet Take 1 tablet by mouth daily.    . ciprofloxacin (CIPRO) 500 MG tablet Take 500 mg by mouth 2 (two) times daily.    . clopidogrel (PLAVIX) 75 MG tablet Take 1 tablet (75 mg total) by mouth daily with breakfast. 30 tablet 10  . Cyclophosphamide 50 MG CAPS TAKE ONE CAPSULE BY MOUTH ONCE DAILY ON  AN  EMPTY  STOMACH  ONE  HOUR  BEFORE  OR  2  HOURS  AFTER  MEALS 30 capsule 2  . dextromethorphan (DELSYM) 30 MG/5ML liquid Take 30 mg by mouth 2 (two) times daily as needed for cough.    . Famotidine (PEPCID PO) Take 1 tablet by mouth 2 (two) times daily. OTC.    . finasteride (PROSCAR) 5 MG tablet Take 1 tablet (5 mg total) by mouth daily. 90 tablet 3  . guaiFENesin (MUCINEX) 600 MG 12 hr tablet Take 600 mg by mouth 2 (two) times daily.    Marland Kitchen HYDROcodone-homatropine (HYDROMET) 5-1.5 MG/5ML syrup Take 5 mLs by mouth every 4 (four) hours as needed. 240 mL 0  . levofloxacin (LEVAQUIN) 750 MG tablet TAKE ONE TABLET BY MOUTH ONCE DAILY. STOP IF DEVELOP ACHING IN JOINTS/MUSCLES 7 tablet 1  . loratadine (CLARITIN) 10 MG tablet Take 10 mg by mouth daily.     . methylcellulose (CITRUCEL) oral powder Take 1 packet by mouth daily.    . metoprolol succinate (TOPROL-XL) 25 MG 24 hr tablet Take 1 tablet (25 mg total) by mouth daily. 15 tablet 6  . nitroGLYCERIN (NITROSTAT) 0.4 MG SL tablet Place 1 tablet (0.4 mg total) under the tongue every 5 (five) minutes x 3 doses as needed for chest pain. 25 tablet 12  . prednisoLONE 5 MG TABS tablet Take 7.5 mg by  mouth daily.    . simvastatin (ZOCOR) 20 MG tablet Take 1 tablet (20 mg total) by mouth daily at 6 PM. 30 tablet 0  . sodium bicarbonate 650 MG tablet TAKE TWO TABLETS BY MOUTH TWICE DAILY 120 tablet 4  . traMADol (ULTRAM) 50 MG tablet 1-2 every 4 hours as needed for cough or pain (Patient taking differently: Take 50 mg by mouth daily as needed for moderate pain. 1-2 every 4 hours as needed for cough or pain) 40 tablet 0  .  isosorbide mononitrate (IMDUR) 30 MG 24 hr tablet Take 1 tablet (30 mg total) by mouth daily. 30 tablet 6  . tamsulosin (FLOMAX) 0.4 MG CAPS capsule TAKE ONE CAPSULE BY MOUTH EVERY EVENING 30 capsule 1   No current facility-administered medications for this visit.    Social History   Social History  . Marital Status: Married    Spouse Name: N/A  . Number of Children: N/A  . Years of Education: N/A   Occupational History  . Retired    Social History Main Topics  . Smoking status: Never Smoker   . Smokeless tobacco: Never Used  . Alcohol Use: No  . Drug Use: No  . Sexual Activity: Yes   Other Topics Concern  . Not on file   Social History Narrative    Family History  Problem Relation Age of Onset  . Asthma Father   . Coronary artery disease Brother   . Coronary artery disease Brother   . Coronary artery disease Mother   . Colon cancer Neg Hx   . Stomach cancer Neg Hx     ROS General: Negative; No fevers, chills, or night sweats HEENT: Negative; No changes in vision or hearing, sinus congestion, difficulty swallowing Pulmonary: Positive for recent Levaquin therapy for lung infection Cardiovascular: See HPI:  GI: Negative; No nausea, vomiting, diarrhea, or abdominal pain GU: Negative; No dysuria, hematuria, or difficulty voiding Musculoskeletal: Negative; no myalgias, joint pain, or weakness Hematologic: Negative; no easy bruising, bleeding Endocrine: Negative; no heat/cold intolerance; no diabetes, Neuro: Negative; no changes in balance,  headaches Skin: Negative; No rashes or skin lesions Psychiatric: Negative; No behavioral problems, depression Sleep: Negative; No snoring,  daytime sleepiness, hypersomnolence, bruxism, restless legs, hypnogognic hallucinations. Other comprehensive 14 point system review is negative   Physical Exam BP 96/68 mmHg  Pulse 93  Ht _0  (1.702 m)  Wt 116 lb 6.4 oz (52.799 kg)  BMI 18.23 kg/m2 Wt Readings from Last 3 Encounters:  03/24/15 116 lb 6.4 oz (52.799 kg)  01/23/15 115 lb (52.164 kg)  12/24/14 120 lb (54.432 kg)   General: Alert, oriented, no distress.  Skin: normal turgor, no rashes, warm and dry HEENT: Normocephalic, atraumatic. Pupils equal round and reactive to light; sclera anicteric; extraocular muscles intact, No lid lag; Nose without nasal septal hypertrophy; Mouth/Parynx benign; Mallinpatti scale 3 Neck: No JVD, no carotid bruits; normal carotid upstroke Lungs: clear to ausculatation and percussion bilaterally; no wheezing or rales, normal inspiratory and expiratory effort Chest wall: without tenderness to palpitation Heart: PMI not displaced, RRR, s1 s2 normal, 2/6 systolic murmur, No diastolic murmur, no rubs, gallops, thrills, or heaves Abdomen: soft, nontender; no hepatosplenomehaly, BS+; abdominal aorta nontender and not dilated by palpation. Back: no CVA tenderness Pulses: 2+  Musculoskeletal: full range of motion, normal strength, no joint deformities Extremities: Pulses 2+, no clubbing cyanosis or edema, Homan's sign negative  Neurologic: grossly nonfocal; Cranial nerves grossly wnl Psychologic: Normal mood and affect   ECG (independently read by me): Sinus rhythm at 93 bpm with first-degree AV block with a PR interval at 210 ms.  QTc interval 437.  LABS:  BMP Latest Ref Rng 07/16/2014 06/24/2014 05/06/2014  Glucose 70 - 99 mg/dL 86 147(H) 106(H)  BUN 6 - 23 mg/dL 28(H) 27(H) 22  Creatinine 0.50 - 1.35 mg/dL 2.35(H) 2.16(H) 1.58(H)  Sodium 137 - 147 mEq/L  143 141 141  Potassium 3.7 - 5.3 mEq/L 4.5 4.4 4.4  Chloride 96 - 112 mEq/L 109 105 105  CO2 19 - 32 mEq/L _0 Calcium 8.4 - 10.5 mg/dL 8.2(L) 8.2(L) 8.2(L)    Hepatic Function Latest Ref Rng 07/16/2014 06/24/2014 03/14/2014  Total Protein 6.0 - 8.3 g/dL - - 5.3(L)  Albumin 3.5 - 5.2 g/dL 2.8(L) 2.9(L) 2.5(L)  AST 0 - 37 U/L - - 11  ALT 0 - 53 U/L - - 7  Alk Phosphatase 39 - 117 U/L - - 78  Total Bilirubin 0.3 - 1.2 mg/dL - - <0.2(L)  Bilirubin, Direct 0.0-0.3 mg/dL - - -    CBC Latest Ref Rng 03/24/2015 03/10/2015 02/24/2015  WBC 4.0 - 10.5 K/uL - - -  Hemoglobin 13.0 - 17.0 g/dL 8.9(L) 11.0(L) 11.5(L)  Hematocrit 39.0 - 52.0 % - - -  Platelets 150 - 400 K/uL - - -   Lab Results  Component Value Date   MCV 101.9* 07/16/2014   MCV 101.3* 07/15/2014   MCV 102.6* 07/14/2014    Lab Results  Component Value Date   TSH 3.841 04/01/2010    BNP No results found for: BNP  ProBNP    Component Value Date/Time   PROBNP 4937.0* 03/22/2014 1000     Lipid Panel     Component Value Date/Time   CHOL 112 03/14/2014 0555   TRIG 61 03/14/2014 0555   HDL 42 03/14/2014 0555   CHOLHDL 2.7 03/14/2014 0555   VLDL 12 03/14/2014 0555   LDLCALC 58 03/14/2014 0555     RADIOLOGY: No results found.    ASSESSMENT AND PLAN: Mr. Cavan Bearden is a 79 year old gentleman with Wegener's granulomatosis , severe bronchiectasis, and stage IV chronic kidney disease.  He is status post intervention to his LAD, circumflex and RCA in August 2015 and still had concomitant ramus intermediate stenosis which was treated medically.  He had recently developed significant chest disc congestion, and completed a course of Levaquin by his primary M.D.  Patient recently has noticed increasing exertional shortness of breath and weakness.  He is now well beta blocked on his current dose of metoprolol XL 12.5 mg I have recommended further titration to 25 mg daily.  He has a 2/6 systolic murmur.  In light of  his increasing shortness of breath.  I am scheduling him for an echo Doppler study to further evaluate systolic and diastolic function as well as valvular architecture.  I am recommending a complete set of laboratory be obtained in the fasting state.  I am empirically adding isosorbide mononitrate 30 mg with his concomitant CAD.  He will be scheduled for Lexiscan nuclear stress test and I recommended he keep his already scheduled appointment to see Dr. Debara Pickett in approximately 1-2 weeks.  Time spent: 25 minutes  Troy Sine, MD, [facility]  03/26/2015 5:11 PM

## 2015-03-27 ENCOUNTER — Telehealth: Payer: Self-pay

## 2015-03-27 ENCOUNTER — Ambulatory Visit (HOSPITAL_COMMUNITY)
Admission: RE | Admit: 2015-03-27 | Discharge: 2015-03-27 | Disposition: A | Discharge status: Home / Self Care | Payer: Medicare Other | Source: Ambulatory Visit | Attending: Cardiovascular Disease | Admitting: Cardiovascular Disease

## 2015-03-27 ENCOUNTER — Telehealth: Payer: Self-pay | Admitting: Gastroenterology

## 2015-03-27 DIAGNOSIS — R0609 Other forms of dyspnea: Secondary | ICD-10-CM

## 2015-03-27 DIAGNOSIS — I25810 Atherosclerosis of coronary artery bypass graft(s) without angina pectoris: Secondary | ICD-10-CM

## 2015-03-27 DIAGNOSIS — R0602 Shortness of breath: Secondary | ICD-10-CM | POA: Diagnosis not present

## 2015-03-27 DIAGNOSIS — R9439 Abnormal result of other cardiovascular function study: Secondary | ICD-10-CM | POA: Insufficient documentation

## 2015-03-27 DIAGNOSIS — R5383 Other fatigue: Secondary | ICD-10-CM | POA: Insufficient documentation

## 2015-03-27 LAB — MYOCARDIAL PERFUSION IMAGING
CHL CUP NUCLEAR SRS: 4
CHL CUP RESTING HR STRESS: 86 {beats}/min
Peak HR: 110 {beats}/min
SDS: 3
SSS: 7
TID: 1.21

## 2015-03-27 LAB — CBC WITH DIFFERENTIAL/PLATELET
BASOS PCT: 1 % (ref 0–1)
Basophils Absolute: 0.1 10*3/uL (ref 0.0–0.1)
EOS ABS: 0.2 10*3/uL (ref 0.0–0.7)
Eosinophils Relative: 4 % (ref 0–5)
HCT: 24.5 % — ABNORMAL LOW (ref 39.0–52.0)
Hemoglobin: 7.9 g/dL — ABNORMAL LOW (ref 13.0–17.0)
Lymphocytes Relative: 14 % (ref 12–46)
Lymphs Abs: 0.8 10*3/uL (ref 0.7–4.0)
MCH: 36.1 pg — AB (ref 26.0–34.0)
MCHC: 32.2 g/dL (ref 30.0–36.0)
MCV: 111.9 fL — ABNORMAL HIGH (ref 78.0–100.0)
MONO ABS: 0.8 10*3/uL (ref 0.1–1.0)
MPV: 9.3 fL (ref 8.6–12.4)
Monocytes Relative: 13 % — ABNORMAL HIGH (ref 3–12)
NEUTROS ABS: 3.9 10*3/uL (ref 1.7–7.7)
Neutrophils Relative %: 68 % (ref 43–77)
PLATELETS: 232 10*3/uL (ref 150–400)
RBC: 2.19 MIL/uL — AB (ref 4.22–5.81)
RDW: 14.7 % (ref 11.5–15.5)
WBC: 5.8 10*3/uL (ref 4.0–10.5)

## 2015-03-27 MED ORDER — REGADENOSON 0.4 MG/5ML IV SOLN
0.4000 mg | Freq: Once | INTRAVENOUS | Status: AC
  Administered 2015-03-27: 0.4 mg via INTRAVENOUS

## 2015-03-27 MED ORDER — TECHNETIUM TC 99M SESTAMIBI GENERIC - CARDIOLITE
31.4000 | Freq: Once | INTRAVENOUS | Status: AC | PRN
  Administered 2015-03-27: 31 via INTRAVENOUS

## 2015-03-27 MED ORDER — AMINOPHYLLINE 25 MG/ML IV SOLN
75.0000 mg | Freq: Once | INTRAVENOUS | Status: AC
  Administered 2015-03-27: 75 mg via INTRAVENOUS

## 2015-03-27 MED ORDER — TECHNETIUM TC 99M SESTAMIBI GENERIC - CARDIOLITE
10.5000 | Freq: Once | INTRAVENOUS | Status: AC | PRN
  Administered 2015-03-27: 11 via INTRAVENOUS

## 2015-03-27 NOTE — Telephone Encounter (Signed)
Patient did not have a CBC today after his stress test.  Wife will make sure he has this done Monday.

## 2015-03-27 NOTE — Telephone Encounter (Signed)
Katrina called back.  They found the lavendar top tube and will run the CBC.  Patient notified he does not need to go to the lab Monday at this time.  Voiced understanding.

## 2015-03-27 NOTE — Telephone Encounter (Signed)
Received a call from [place] with [facility] wanting to make sure patient had cbc today.Spoke to [place] at [place] lab at [employer] she stated patient had lipid panel,tsh,cbc,cmet yesterday 03/26/15.Crystal wanted to make sure we send her cbc results.Advised I sent Dr.Hilty's nurse Eliezer Lofts a message 8/18 to fax results to you at fax # [phone] Attention Crystal.

## 2015-03-27 NOTE — Telephone Encounter (Signed)
Katrina at [employer] did a lab draw on Richard [name] yesterday.  Results not showing up in Epic.  Customer Service called and they do not show ever receiving the lavendar top tube.  Katrina will call customer service to see if she can get this straightened out and call back.

## 2015-03-27 NOTE — Telephone Encounter (Signed)
Patient is scheduled for a follow up with Dr. Fuller Plan for 04/07/15 3:00 with Dr. Fuller Plan.  Patient was advised by Dr. Jimmy Footman that he is anemic and needs to return to GI.

## 2015-03-31 ENCOUNTER — Other Ambulatory Visit: Payer: Self-pay

## 2015-03-31 ENCOUNTER — Telehealth: Payer: Self-pay

## 2015-03-31 ENCOUNTER — Ambulatory Visit (HOSPITAL_COMMUNITY): Payer: Medicare Other | Attending: Cardiovascular Disease

## 2015-03-31 DIAGNOSIS — I517 Cardiomegaly: Secondary | ICD-10-CM | POA: Insufficient documentation

## 2015-03-31 DIAGNOSIS — I371 Nonrheumatic pulmonary valve insufficiency: Secondary | ICD-10-CM | POA: Insufficient documentation

## 2015-03-31 DIAGNOSIS — I25810 Atherosclerosis of coronary artery bypass graft(s) without angina pectoris: Secondary | ICD-10-CM

## 2015-03-31 DIAGNOSIS — I10 Essential (primary) hypertension: Secondary | ICD-10-CM | POA: Diagnosis not present

## 2015-03-31 DIAGNOSIS — R0600 Dyspnea, unspecified: Secondary | ICD-10-CM | POA: Diagnosis present

## 2015-03-31 DIAGNOSIS — I350 Nonrheumatic aortic (valve) stenosis: Secondary | ICD-10-CM | POA: Insufficient documentation

## 2015-03-31 DIAGNOSIS — R0609 Other forms of dyspnea: Secondary | ICD-10-CM | POA: Diagnosis not present

## 2015-03-31 DIAGNOSIS — I071 Rheumatic tricuspid insufficiency: Secondary | ICD-10-CM | POA: Insufficient documentation

## 2015-03-31 IMAGING — CR DG CHEST 2V
2 series · 2 of 2 positions shown · non-contrast
Comparison: Chest x-ray of 05/23/2014

CLINICAL DATA: Cough, congestion, shortness of breath, history of
bronchiectasis and Wegener's granulomatosis

EXAM:
CHEST  2 VIEW

[view not recorded (1 of 2)]
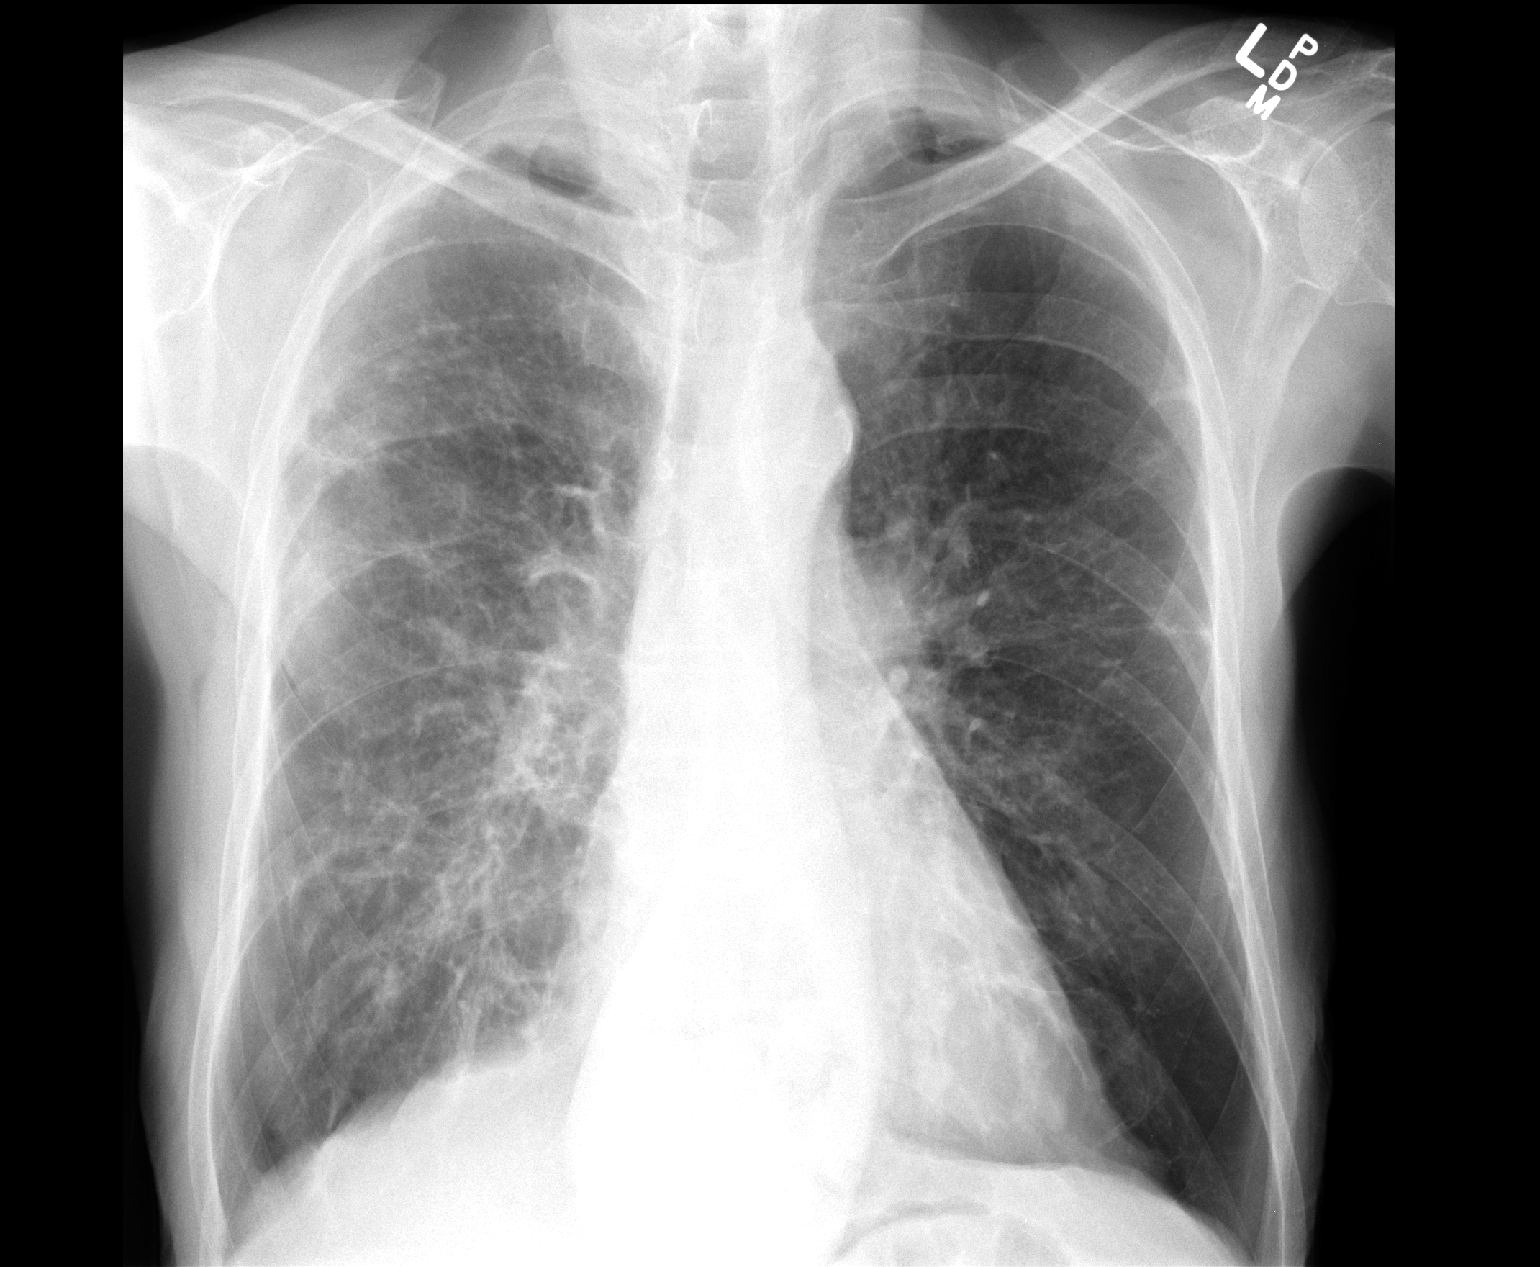

[view not recorded (2 of 2)]
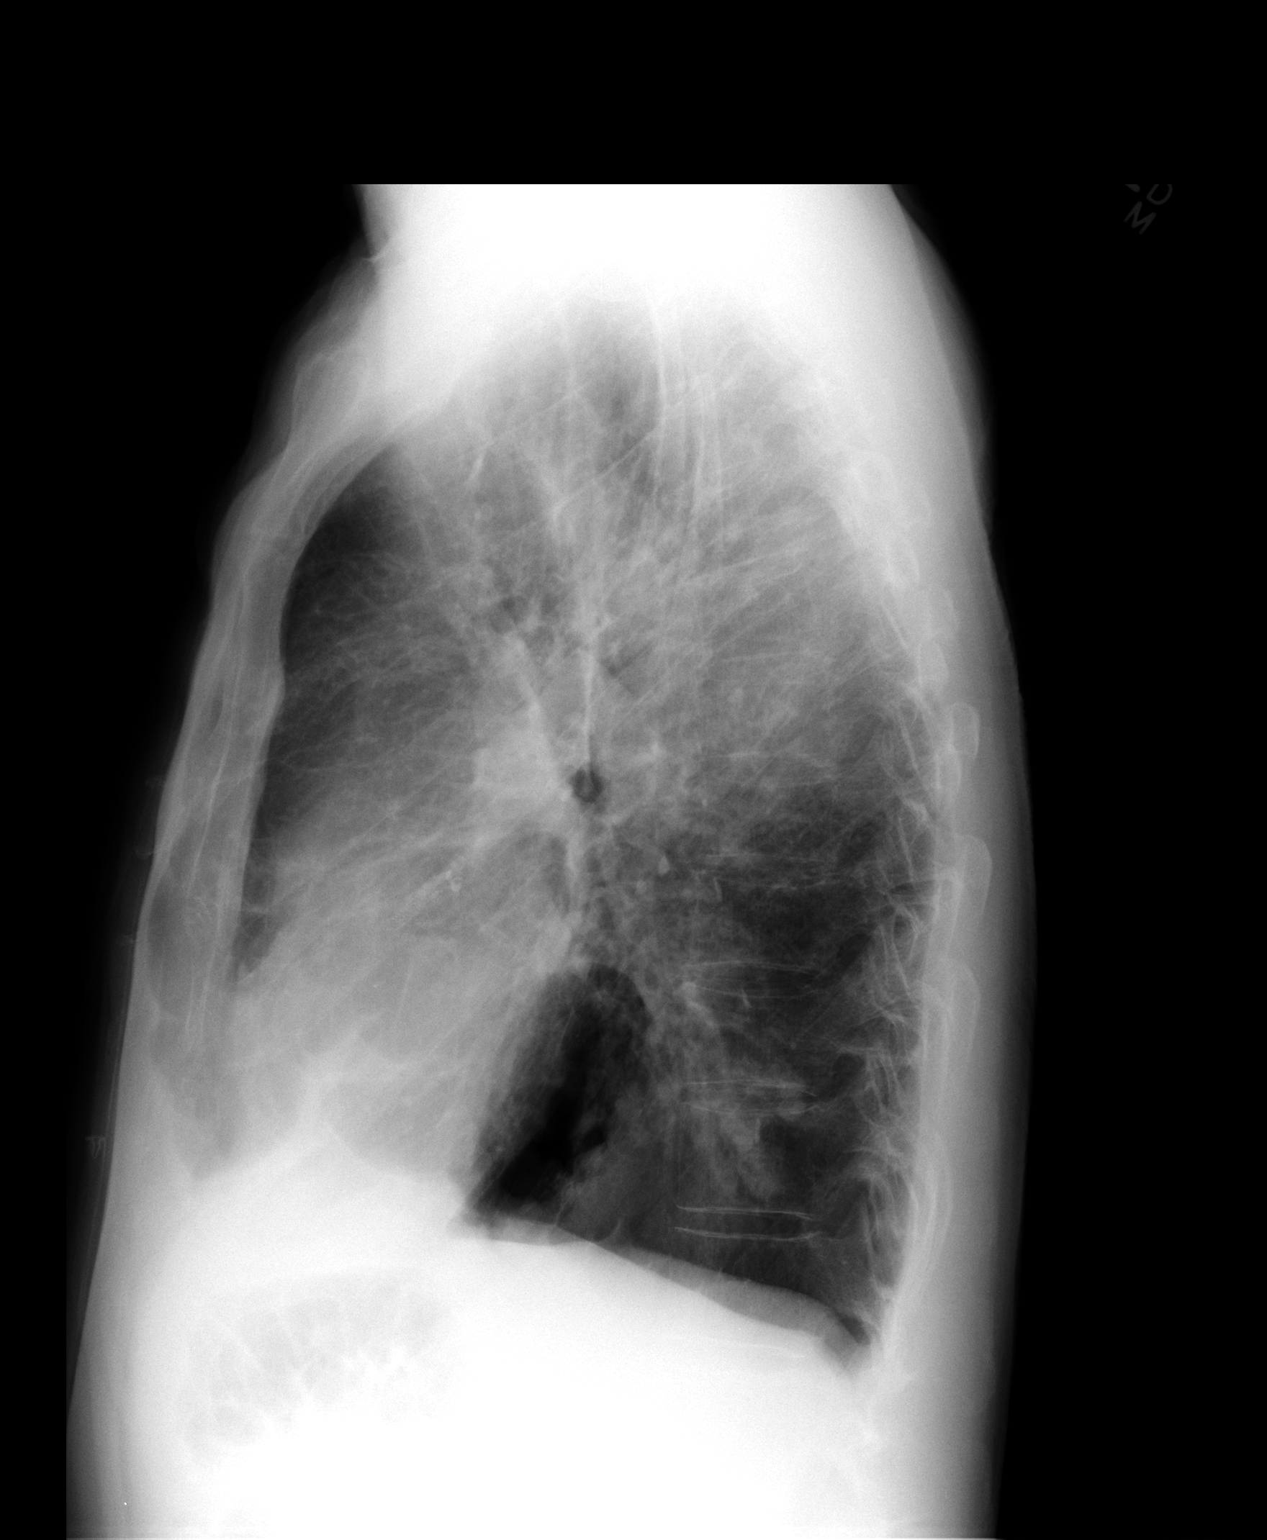

[2 of 2 positions shown; findings below may reference images not displayed]

FINDINGS: Foci of pulmonary fibrosis are again noted bilaterally. The lungs
remain hyperaerated consistent with emphysema with flattened
hemidiaphragms. Mediastinal and hilar contours are unchanged and the
heart is within upper limits of normal. There is a moderate sized
hiatal hernia present. No bony abnormality is seen.
IMPRESSION: 1. Chronic fibrotic change with hyper aeration. No definite active
process.
2. Moderate size hiatal hernia.

## 2015-03-31 NOTE — Telephone Encounter (Signed)
Received call from [place] with [facility] copy of 03/26/15 cbc faxed to her at fax # [phone].

## 2015-04-01 NOTE — Telephone Encounter (Signed)
CBC routed via EPIC fax to [employer] with [employer]

## 2015-04-03 ENCOUNTER — Ambulatory Visit (INDEPENDENT_AMBULATORY_CARE_PROVIDER_SITE_OTHER): Payer: Medicare Other | Admitting: Internal Medicine

## 2015-04-03 ENCOUNTER — Encounter: Payer: Self-pay | Admitting: Internal Medicine

## 2015-04-03 VITALS — BP 112/50 | HR 60 | Ht 67.0 in | Wt 119.4 lb

## 2015-04-03 DIAGNOSIS — M3130 Wegener's granulomatosis without renal involvement: Secondary | ICD-10-CM | POA: Diagnosis not present

## 2015-04-03 DIAGNOSIS — I471 Supraventricular tachycardia: Secondary | ICD-10-CM

## 2015-04-03 DIAGNOSIS — R0602 Shortness of breath: Secondary | ICD-10-CM

## 2015-04-03 DIAGNOSIS — I10 Essential (primary) hypertension: Secondary | ICD-10-CM

## 2015-04-03 DIAGNOSIS — I255 Ischemic cardiomyopathy: Secondary | ICD-10-CM

## 2015-04-03 NOTE — Progress Notes (Signed)
OFFICE NOTE  Chief Complaint:  Dyspnea, follow-up tests  Primary Care Physician: [name], MD  HPI:  Richard [name] is a 79 y.o. male here for follow up after complex hospitalization, with a history of hypothyroidism, HLD, anemia, Wegener's granulomatosis and no prior cardiac disease who presented to Hospital with chest pain 03/13/14, + nuc study and cardiac cath 03/14/14 which revealed severe multivessel CAD involving all 4 major branches. Seen by Dr. Madolyn Frieze who deemed him unsuitable for CABG due to underlying comorbid conditions. ( sts score >30). It was elected to proceed with staged multivessel PCI . On 03/17/14 he had successful PCI to the LAD and POBA to a chronically occluded LCX. There remained high grade proximal disease to a large proximal Ramus branch as well as mid-RCA disease so staged PCI was planned at a later date due to pulmonary issues and renal dysfunction. This was further complicated by right femoral artery pseudoaneurysm requiring repair. PCI on 03/21/14 with rotablator atherectomy with BMS to the mid RCA. Due to length of fluoro time and large volume of contrast, it was decided to medically manage the ramus stenosis which Dr. Angelena Form felt was moderately stenosed and if he has recurrent anginal then consider PCI.   He recently was seen by one of our nurse practitioners who has been working on him to evaluate his anemia. He was also noted to be hyperkalemic. This is required for treatment with Kayexalate and dietary changes. Unfortunate dietary changes as caused him to lose a significant amount of weight. The family is now concerned about what he can do in order to keep his potassium down. He does have stage III chronic kidney disease.   Richard [name] returns today in follow-up. He is been followed by Dr. Jimmy Footman for stage IV kidney disease secondary to Wegener's granulomatosis. He has developed worsening renal function and has a high ANCA titer. He underwent renal biopsy 2  days ago with the thought that he may be able to be switched from Cytoxan over to rituximab. From a cardiovascular standpoint he reports some improvement in his breathing however energy level is still not great. He was doing somewhat better up until his biopsy but he felt like he took somewhat of a step back after that. He is currently holding his antiplatelets but will resume them tomorrow.  I the pleasure see Richard [name] back in the office today. He was recently seen by Dr. Claiborne Billings last week for worsening shortness of breath. Dr. Claiborne Billings felt that this could be worsening angina and ordered a stress test and echocardiogram, additionally he increase his metoprolol from 12.5-25 mg daily and added imdur 30 mg daily. Richard [name] reports that he does not feel any different today than he did last week. He underwent a nuclear stress test which was negative for ischemia. The study was non-gated and therefore an echocardiogram was obtained. The echo demonstrates an improved EF to 60-65%, previously between 40 and 45%. This suggests that his recent revascularization is actually led to some myocardial improvement. He seems to be tolerating the increased dose of beta blocker however blood pressure is low normal today and heart rate is stable. He recently was found to have worsening anemia and has been referred to Dr. Fuller Plan for evaluation of possible acute blood loss.  PMHx:  Past Medical History  Diagnosis Date  . GERD (gastroesophageal reflux disease)   . Asthma   . Fungal infection     lungs  . Cataract   .  BPH (benign prostatic hyperplasia)     sees Dr. Risa Grill, biopsy June 2015 was benign   . Wegener's granulomatosis     sees Dr. [name]   . Peptic stricture of esophagus   . Anemia   . Hiatal hernia   . Adenomatous colon polyp   . Bronchiectasis   . On home oxygen therapy     uses 2 l/m nasally at bedtime  . HOH (hard of hearing)     bilaterally  . HTN (hypertension) 09/28/2013  . CAD (coronary artery  disease)     a. [place] s/p Goeh[place] x3 and ultimately BMS to pLAD& POBA to CTO of circumflex/marginal vessel on 03/17/14 and staged BMS to [facility] on 03/21/14  . Femoral artery pseudo-aneurysm, right     a. s/p repair. Follow up with Dr. Kellie Simmering   . Diverticulosis     Past Surgical History  Procedure Laterality Date  . Hernia repair    . Eye surgery      cataracts removed.   . Colonoscopy  08-16-11    per Dr. Fuller Plan, diverticulosis and polyps, repeat in 5 yrs   . Esophagogastroduodenoscopy (egd) with esophageal dilation  11-29-10    per Dr. Fuller Plan   . Cataract extraction, bilateral  12-18-12    bilateral  . Cholecystectomy N/A 12/21/2012    Procedure: LAPAROSCOPIC CHOLECYSTECTOMY WITH INTRAOPERATIVE CHOLANGIOGRAM;  Surgeon: Edward Jolly, MD;  Location: [facility] ORS;  Service: General;  Laterality: N/A;  . Hematoma evacuation Right 03/19/2014    Procedure: Suture repair of femoral artery with evacuation of hematoma;  Surgeon: Mal Misty, MD;  Location: [facility] OR;  Service: Vascular;  Laterality: Right;  . Coronary angioplasty  03/2014  . Left heart catheterization with coronary angiogram N/A 03/14/2014    Procedure: LEFT HEART CATHETERIZATION WITH CORONARY ANGIOGRAM;  Surgeon: Leonie Man, MD;  Location: [facility] CATH LAB;  Service: Cardiovascular;  Laterality: N/A;  . Percutaneous coronary stent intervention (pci-s) N/A 03/17/2014    Procedure: PERCUTANEOUS CORONARY STENT INTERVENTION (PCI-S);  Surgeon: Troy Sine, MD;  Location: [facility] CATH LAB;  Service: Cardiovascular;  Laterality: N/A;  . Cardiac catheterization  03/17/2014    Procedure: CORONARY BALLOON ANGIOPLASTY;  Surgeon: Troy Sine, MD;  Location: [facility] CATH LAB;  Service: Cardiovascular;;  . Percutaneous coronary stent intervention (pci-s) N/A 03/21/2014    Procedure: PERCUTANEOUS CORONARY STENT INTERVENTION (PCI-S);  Surgeon: Burnell Blanks, MD;  Location: [facility] CATH LAB;  Service: Cardiovascular;  Laterality: N/A;  . Renal biopsy      FAMHx:    Family History  Problem Relation Age of Onset  . Asthma Father   . Coronary artery disease Brother   . Coronary artery disease Brother   . Coronary artery disease Mother   . Colon cancer Neg Hx   . Stomach cancer Neg Hx     SOCHx:   reports that he has never smoked. He has never used smokeless tobacco. He reports that he does not drink alcohol or use illicit drugs.  ALLERGIES:  No Known Allergies  ROS: A comprehensive review of systems was negative except for: Constitutional: positive for fatigue and weight loss Respiratory: positive for dyspnea on exertion  HOME MEDS: Current Outpatient Prescriptions  Medication Sig Dispense Refill  . albuterol (PROVENTIL HFA;VENTOLIN HFA) 108 (90 BASE) MCG/ACT inhaler Inhale 2 puffs into the lungs every 4 (four) hours as needed for wheezing or shortness of breath.    Marland Kitchen aspirin EC 81 MG tablet Take 81 mg by mouth  daily.    . b complex vitamins tablet Take 1 tablet by mouth daily.      . bisacodyl (DULCOLAX) 5 MG EC tablet Take 5 mg by mouth 2 (two) times daily.     . budesonide-formoterol (SYMBICORT) 160-4.5 MCG/ACT inhaler Inhale 2 puffs into the lungs 2 (two) times daily.    . calcium citrate-vitamin D (CITRACAL+D) 315-200 MG-UNIT per tablet Take 1 tablet by mouth daily.    . ciprofloxacin (CIPRO) 500 MG tablet Take 500 mg by mouth 2 (two) times daily.    . clopidogrel (PLAVIX) 75 MG tablet Take 1 tablet (75 mg total) by mouth daily with breakfast. 30 tablet 10  . Cyclophosphamide 50 MG CAPS TAKE ONE CAPSULE BY MOUTH ONCE DAILY ON  AN  EMPTY  STOMACH  ONE  HOUR  BEFORE  OR  2  HOURS  AFTER  MEALS 30 capsule 2  . dextromethorphan (DELSYM) 30 MG/5ML liquid Take 30 mg by mouth 2 (two) times daily as needed for cough.    . Famotidine (PEPCID PO) Take 1 tablet by mouth 2 (two) times daily. OTC.    . finasteride (PROSCAR) 5 MG tablet Take 1 tablet (5 mg total) by mouth daily. 90 tablet 3  . guaiFENesin (MUCINEX) 600 MG 12 hr tablet Take 600 mg by  mouth 2 (two) times daily.    Marland Kitchen HYDROcodone-homatropine (HYDROMET) 5-1.5 MG/5ML syrup Take 5 mLs by mouth every 4 (four) hours as needed. 240 mL 0  . isosorbide mononitrate (IMDUR) 30 MG 24 hr tablet Take 1 tablet (30 mg total) by mouth daily. 30 tablet 6  . levofloxacin (LEVAQUIN) 750 MG tablet TAKE ONE TABLET BY MOUTH ONCE DAILY. STOP IF DEVELOP ACHING IN JOINTS/MUSCLES 7 tablet 1  . loratadine (CLARITIN) 10 MG tablet Take 10 mg by mouth daily.     . methylcellulose (CITRUCEL) oral powder Take 1 packet by mouth daily.    . metoprolol succinate (TOPROL-XL) 25 MG 24 hr tablet Take 1 tablet (25 mg total) by mouth daily. 15 tablet 6  . nitroGLYCERIN (NITROSTAT) 0.4 MG SL tablet Place 1 tablet (0.4 mg total) under the tongue every 5 (five) minutes x 3 doses as needed for chest pain. 25 tablet 12  . prednisoLONE 5 MG TABS tablet Take 7.5 mg by mouth daily.    . simvastatin (ZOCOR) 20 MG tablet Take 1 tablet (20 mg total) by mouth daily at 6 PM. 30 tablet 0  . sodium bicarbonate 650 MG tablet TAKE TWO TABLETS BY MOUTH TWICE DAILY 120 tablet 4  . tamsulosin (FLOMAX) 0.4 MG CAPS capsule TAKE ONE CAPSULE BY MOUTH EVERY EVENING 30 capsule 1  . traMADol (ULTRAM) 50 MG tablet 1-2 every 4 hours as needed for cough or pain (Patient taking differently: Take 50 mg by mouth daily as needed for moderate pain. 1-2 every 4 hours as needed for cough or pain) 40 tablet 0   No current facility-administered medications for this visit.    LABS/IMAGING: No results found for this or any previous visit (from the past 48 hour(s)). No results found.  VITALS: BP 112/50 mmHg  Pulse 60  Ht 5\' 7"  (1.702 m)  Wt 119 lb 6.4 oz (54.159 kg)  BMI 18.70 kg/m2  EXAM: General appearance: alert, appears older than stated age and no distress Neck: no carotid bruit and no JVD Lungs: diminished breath sounds bilaterally Heart: regular rate and rhythm, S1, S2 normal, no murmur, click, rub or gallop Abdomen: soft, non-tender;  bowel sounds  normal; no masses,  no organomegaly Extremities: extremities normal, atraumatic, no cyanosis or edema Pulses: 2+ and symmetric Skin: Pale, cool skin Neurologic: Grossly normal Psych: Normal  EKG: Deferred   ASSESSMENT: 1. History of Wegener's granulomatosis on immunosuppression 2. Multivessel CAD s/p PCI to the LAD, LCX and RCA - recent low risk nuclear stress test without ischemia and a small fixed defect concerning for scar  3. Stage III CKD 4. Hyperkalemia 5. Ischemic cardiomyopathy, EF 40% -> Now 60-65% 6.   Progressive dyspnea on exertion 7.   Anemia  PLAN: 1.   Richard [name] has had progressive shortness of breath and this may be related to anemia and/or lung disease. He is currently on anti-biotics and is scheduled to see a gastroenterologist for possible acute blood loss. His EF is actually improved by echo indicating that his heart is responded to revascularization. Nuclear stress test was negative for ischemia with a small fixed defect that could be scar or artifact as it was not gated. He seems to be tolerating increased dose beta blocker and will continue that but noticed no difference on isosorbide, which seems to crust and headache. I will plan to discontinue that today.   Plan follow-up in 6 months.   Pixie Casino, MD, [facility] Attending Cardiologist [place] 04/03/2015, 10:34 AM

## 2015-04-03 NOTE — Patient Instructions (Signed)
Your physician wants you to follow-up in: 6 months with Dr. Hilty. You will receive a reminder letter in the mail two months in advance. If you don't receive a letter, please call our office to schedule the follow-up appointment.    

## 2015-04-04 ENCOUNTER — Other Ambulatory Visit: Payer: Self-pay | Admitting: Internal Medicine

## 2015-04-06 ENCOUNTER — Telehealth: Payer: Self-pay | Admitting: Internal Medicine

## 2015-04-06 ENCOUNTER — Other Ambulatory Visit (HOSPITAL_COMMUNITY): Payer: Self-pay | Admitting: *Deleted

## 2015-04-06 NOTE — Telephone Encounter (Signed)
Pt saw MW 10/30/14 and told to f/u in 3 months. No pending appt Pt needs OV lmtcb

## 2015-04-07 ENCOUNTER — Encounter: Payer: Self-pay | Admitting: Gastroenterology

## 2015-04-07 ENCOUNTER — Other Ambulatory Visit (INDEPENDENT_AMBULATORY_CARE_PROVIDER_SITE_OTHER): Payer: Medicare Other

## 2015-04-07 ENCOUNTER — Ambulatory Visit (INDEPENDENT_AMBULATORY_CARE_PROVIDER_SITE_OTHER): Payer: Medicare Other | Admitting: Gastroenterology

## 2015-04-07 ENCOUNTER — Encounter (HOSPITAL_COMMUNITY)
Admission: RE | Admit: 2015-04-07 | Discharge: 2015-04-07 | Disposition: A | Discharge status: Home / Self Care | Payer: Medicare Other | Source: Ambulatory Visit | Attending: Nephrology | Admitting: Nephrology

## 2015-04-07 ENCOUNTER — Encounter (HOSPITAL_COMMUNITY): Payer: Medicare Other

## 2015-04-07 VITALS — BP 88/48 | HR 84 | Ht 65.5 in | Wt 120.1 lb

## 2015-04-07 DIAGNOSIS — K2960 Other gastritis without bleeding: Secondary | ICD-10-CM

## 2015-04-07 DIAGNOSIS — N183 Chronic kidney disease, stage 3 (moderate): Secondary | ICD-10-CM | POA: Insufficient documentation

## 2015-04-07 DIAGNOSIS — D509 Iron deficiency anemia, unspecified: Secondary | ICD-10-CM | POA: Insufficient documentation

## 2015-04-07 DIAGNOSIS — Z5181 Encounter for therapeutic drug level monitoring: Secondary | ICD-10-CM | POA: Insufficient documentation

## 2015-04-07 DIAGNOSIS — D638 Anemia in other chronic diseases classified elsewhere: Secondary | ICD-10-CM | POA: Diagnosis not present

## 2015-04-07 DIAGNOSIS — Z8719 Personal history of other diseases of the digestive system: Secondary | ICD-10-CM

## 2015-04-07 DIAGNOSIS — D631 Anemia in chronic kidney disease: Secondary | ICD-10-CM | POA: Insufficient documentation

## 2015-04-07 DIAGNOSIS — K219 Gastro-esophageal reflux disease without esophagitis: Secondary | ICD-10-CM

## 2015-04-07 DIAGNOSIS — K2900 Acute gastritis without bleeding: Secondary | ICD-10-CM

## 2015-04-07 DIAGNOSIS — Z79899 Other long term (current) drug therapy: Secondary | ICD-10-CM | POA: Diagnosis not present

## 2015-04-07 LAB — POCT HEMOGLOBIN-HEMACUE: Hemoglobin: 8.3 g/dL — ABNORMAL LOW (ref 13.0–17.0)

## 2015-04-07 LAB — VITAMIN B12: Vitamin B-12: 537 pg/mL (ref 211–911)

## 2015-04-07 LAB — FOLATE: Folate: >24.8 ng/mL (ref >5.9)

## 2015-04-07 MED ORDER — EPOETIN ALFA 10000 UNIT/ML IJ SOLN
INTRAMUSCULAR | Status: AC
  Administered 2015-04-07: 10000 [IU] via SUBCUTANEOUS
  Filled 2015-04-07: qty 1

## 2015-04-07 MED ORDER — RANITIDINE HCL 300 MG PO TABS: 300.0000 mg | ORAL_TABLET | Freq: Two times a day (BID) | ORAL | Status: DC

## 2015-04-07 MED ORDER — EPOETIN ALFA 20000 UNIT/ML IJ SOLN
INTRAMUSCULAR | Status: AC
  Administered 2015-04-07: 20000 [IU] via SUBCUTANEOUS
  Filled 2015-04-07: qty 1

## 2015-04-07 MED ORDER — FERUMOXYTOL INJECTION 510 MG/17 ML
510.0000 mg | Freq: Once | INTRAVENOUS | Status: AC
  Administered 2015-04-07: 510 mg via INTRAVENOUS
  Filled 2015-04-07: qty 17

## 2015-04-07 NOTE — Patient Instructions (Addendum)
Your physician has requested that you go to the basement for the following lab work before leaving today:Vitmain B12 and Folate.  Stop famotidine.   We have sent the following medications to your pharmacy for you to pick up at your convenience:ranitidine.   Thank you for choosing me and [facility].  Pricilla Riffle. Dagoberto Ligas., MD., Marval Regal   cc: Mauricia Area, MD

## 2015-04-07 NOTE — Telephone Encounter (Signed)
[phone], pt cb, informed pt of below and patient states MW is the one who prescribed it,

## 2015-04-07 NOTE — Telephone Encounter (Signed)
[phone] pt calling back till 3:00

## 2015-04-07 NOTE — Telephone Encounter (Signed)
lmomtcb x 2  

## 2015-04-07 NOTE — Telephone Encounter (Signed)
Called and spoke to pt. Informed pt of the recs per Dr. [name]. Pt verbalized understanding and stated he would call his renal provider and inform them of the recs per MW and if there are any issues he will call us back. Nothing further needed at this time.

## 2015-04-07 NOTE — Telephone Encounter (Signed)
Called and spoke to pt. Pt requesting a refill on Cyclophosphamide 50mg . Pt last seen in 10/2014 and advised to f/u in 3 months. Pt has appt with MW on 9.1.2016.   Dr. [name] please advise if ok to refill medication. Thanks.

## 2015-04-07 NOTE — Telephone Encounter (Signed)
Left message for pt to call back  °

## 2015-04-07 NOTE — Progress Notes (Signed)
History of Present Illness: This is a 79 year old male here for additional evaluation of a worsening macrocytic anemia. Hemoglobin has been about 11 for the past several months. Recently dropped to Hb=7.9, MCV=111.9 2 weeks ago and Hb=8.3 today. Fe studies, ferritin do not show fe deficiency. Notes intermittent very small amounts of BRBPR with bowel movements every few weeks. No other GI bleeding. His PPI to treat GERD, peptic stricture of erosive esophagitis was discontinued by Dr. Jimmy Footman per the patient's report. Denies weight loss, abdominal pain, constipation, diarrhea, change in stool caliber, melena, nausea, vomiting, dysphagia, reflux symptoms, chest pain.  EGD 10/2014: esophageal stricture, HH, erosive gastritis-biopsies revealed reactive gastropathy Colonoscopy 08/2011: 1 small adenomatous polyp and diverticulosis.  No Known Allergies Outpatient Prescriptions Prior to Visit  Medication Sig Dispense Refill  . albuterol (PROVENTIL HFA;VENTOLIN HFA) 108 (90 BASE) MCG/ACT inhaler Inhale 2 puffs into the lungs every 4 (four) hours as needed for wheezing or shortness of breath.    Marland Kitchen aspirin EC 81 MG tablet Take 81 mg by mouth daily.    Marland Kitchen b complex vitamins tablet Take 1 tablet by mouth daily.      . bisacodyl (DULCOLAX) 5 MG EC tablet Take 5 mg by mouth 2 (two) times daily.     . budesonide-formoterol (SYMBICORT) 160-4.5 MCG/ACT inhaler Inhale 2 puffs into the lungs 2 (two) times daily.    . calcium citrate-vitamin D (CITRACAL+D) 315-200 MG-UNIT per tablet Take 1 tablet by mouth daily.    . ciprofloxacin (CIPRO) 500 MG tablet Take 500 mg by mouth 2 (two) times daily.    . clopidogrel (PLAVIX) 75 MG tablet Take 1 tablet (75 mg total) by mouth daily with breakfast. 30 tablet 10  . Cyclophosphamide 50 MG CAPS TAKE ONE CAPSULE BY MOUTH ONCE DAILY ON  AN  EMPTY  STOMACH  ONE  HOUR  BEFORE  OR  2  HOURS  AFTER  MEALS 30 capsule 2  . dextromethorphan (DELSYM) 30 MG/5ML liquid Take 30 mg by mouth  2 (two) times daily as needed for cough.    . Famotidine (PEPCID PO) Take 1 tablet by mouth 2 (two) times daily. OTC.    . finasteride (PROSCAR) 5 MG tablet Take 1 tablet (5 mg total) by mouth daily. 90 tablet 3  . guaiFENesin (MUCINEX) 600 MG 12 hr tablet Take 600 mg by mouth 2 (two) times daily.    Marland Kitchen HYDROcodone-homatropine (HYDROMET) 5-1.5 MG/5ML syrup Take 5 mLs by mouth every 4 (four) hours as needed. 240 mL 0  . levofloxacin (LEVAQUIN) 750 MG tablet TAKE ONE TABLET BY MOUTH ONCE DAILY. STOP IF DEVELOP ACHING IN JOINTS/MUSCLES 7 tablet 1  . loratadine (CLARITIN) 10 MG tablet Take 10 mg by mouth daily.     . methylcellulose (CITRUCEL) oral powder Take 1 packet by mouth daily.    . metoprolol succinate (TOPROL-XL) 25 MG 24 hr tablet Take 1 tablet (25 mg total) by mouth daily. 15 tablet 6  . nitroGLYCERIN (NITROSTAT) 0.4 MG SL tablet Place 1 tablet (0.4 mg total) under the tongue every 5 (five) minutes x 3 doses as needed for chest pain. 25 tablet 12  . prednisoLONE 5 MG TABS tablet Take 7.5 mg by mouth daily.    . simvastatin (ZOCOR) 20 MG tablet Take 1 tablet (20 mg total) by mouth daily at 6 PM. 30 tablet 0  . sodium bicarbonate 650 MG tablet TAKE TWO TABLETS BY MOUTH TWICE DAILY 120 tablet 4  . tamsulosin (  FLOMAX) 0.4 MG CAPS capsule TAKE ONE CAPSULE BY MOUTH EVERY EVENING 30 capsule 1  . traMADol (ULTRAM) 50 MG tablet 1-2 every 4 hours as needed for cough or pain (Patient taking differently: Take 50 mg by mouth daily as needed for moderate pain. 1-2 every 4 hours as needed for cough or pain) 40 tablet 0  . isosorbide mononitrate (IMDUR) 30 MG 24 hr tablet Take 1 tablet (30 mg total) by mouth daily. 30 tablet 6   No facility-administered medications prior to visit.   Past Medical History  Diagnosis Date  . GERD (gastroesophageal reflux disease)   . Asthma   . Fungal infection     lungs  . Cataract   . BPH (benign prostatic hyperplasia)     sees Dr. Risa Grill, biopsy June 2015 was benign    . Wegener's granulomatosis     sees Dr. [name]   . Peptic stricture of esophagus   . Anemia   . Hiatal hernia   . Adenomatous colon polyp   . Bronchiectasis   . On home oxygen therapy     uses 2 l/m nasally at bedtime  . HOH (hard of hearing)     bilaterally  . HTN (hypertension) 09/28/2013  . CAD (coronary artery disease)     a. [place] s/p [place] x3 and ultimately BMS to pLAD& POBA to CTO of circumflex/marginal vessel on 03/17/14 and staged BMS to [facility] on 03/21/14  . Femoral artery pseudo-aneurysm, right     a. s/p repair. Follow up with Dr. Kellie Simmering   . Diverticulosis    Past Surgical History  Procedure Laterality Date  . Hernia repair    . Eye surgery      cataracts removed.   . Colonoscopy  08-16-11    per Dr. Fuller Plan, diverticulosis and polyps, repeat in 5 yrs   . Esophagogastroduodenoscopy (egd) with esophageal dilation  11-29-10    per Dr. Fuller Plan   . Cataract extraction, bilateral  12-18-12    bilateral  . Cholecystectomy N/A 12/21/2012    Procedure: LAPAROSCOPIC CHOLECYSTECTOMY WITH INTRAOPERATIVE CHOLANGIOGRAM;  Surgeon: Edward Jolly, MD;  Location: [facility] ORS;  Service: General;  Laterality: N/A;  . Hematoma evacuation Right 03/19/2014    Procedure: Suture repair of femoral artery with evacuation of hematoma;  Surgeon: Mal Misty, MD;  Location: [facility] OR;  Service: Vascular;  Laterality: Right;  . Coronary angioplasty  03/2014  . Left heart catheterization with coronary angiogram N/A 03/14/2014    Procedure: LEFT HEART CATHETERIZATION WITH CORONARY ANGIOGRAM;  Surgeon: Leonie Man, MD;  Location: [facility] CATH LAB;  Service: Cardiovascular;  Laterality: N/A;  . Percutaneous coronary stent intervention (pci-s) N/A 03/17/2014    Procedure: PERCUTANEOUS CORONARY STENT INTERVENTION (PCI-S);  Surgeon: Troy Sine, MD;  Location: [facility] CATH LAB;  Service: Cardiovascular;  Laterality: N/A;  . Cardiac catheterization  03/17/2014    Procedure: CORONARY BALLOON ANGIOPLASTY;  Surgeon: Troy Sine,  MD;  Location: [facility] CATH LAB;  Service: Cardiovascular;;  . Percutaneous coronary stent intervention (pci-s) N/A 03/21/2014    Procedure: PERCUTANEOUS CORONARY STENT INTERVENTION (PCI-S);  Surgeon: Burnell Blanks, MD;  Location: [facility] CATH LAB;  Service: Cardiovascular;  Laterality: N/A;  . Renal biopsy     Social History   Social History  . Marital Status: Married    Spouse Name: N/A  . Number of Children: N/A  . Years of Education: N/A   Occupational History  . Retired    Social History Main Topics  .  Smoking status: Never Smoker   . Smokeless tobacco: Never Used  . Alcohol Use: No  . Drug Use: No  . Sexual Activity: Yes   Other Topics Concern  . None   Social History Narrative   Family History  Problem Relation Age of Onset  . Asthma Father   . Coronary artery disease Brother   . Coronary artery disease Brother   . Coronary artery disease Mother   . Colon cancer Neg Hx   . Stomach cancer Neg Hx       Physical Exam: General: Well developed, well nourished, thin, chronically ill appearing, no acute distress Head: Normocephalic and atraumatic Eyes:  sclerae anicteric, EOMI Ears: Normal auditory acuity Mouth: No deformity or lesions Lungs: Clear throughout to auscultation Heart: Regular rate and rhythm; no murmurs, rubs or bruits Abdomen: Soft, non tender and non distended. No masses, hepatosplenomegaly or hernias noted. Normal Bowel sounds Rectal: No lesions, brown heme negative stool Musculoskeletal: Symmetrical with no gross deformities  Pulses:  Normal pulses noted Extremities: No clubbing, cyanosis, edema or deformities noted Neurological: Alert oriented x 4, grossly nonfocal Psychological:  Alert and cooperative. Normal mood and affect  Assessment and Recommendations:  1. Anemia, macrocytic, worsening, likely chronic disease related. Heme negative stool. Currently no evidence for GI blood loss. R/O B12 and folate deficiency. Unclear as to why his Hb  dropped recently-he may have had a self limited GI bleed from erosive gastritis or an ulcer. He is maintained on Plavix and corticosteroids and his PPI was stopped. No plans to repeat EGD or colonoscopy at this time since he is not currently bleeding, he had a recent EGD and a colonoscopy 3 years ago, which makes gastrointestinal tract malignancies very unlikely, and he is at higher risk for procedure and sedation related complications due to his comorbidities. Pt advised to contact us for melena, hematochezia and if anemia continues to worsen would repeat stool hemoccults.  2. GERD, erosive gastritis-reactive gastropathy, history of esophageal stricture. Pt relates that his daily PPI was stopped by Dr. Jimmy Footman. He needs improved acid suppression. Start ranitidine 300 mg po bid for long term use. DC famotine. PPI daily would be better but will try to avoid given Dr. Deterding's recommendations. Avoid all ASA and NSAID medications.  3. Intermittent small-volume hematochezia likely secondary to hemorrhoids which does not explain his worsening anemia.  4. Wegener's granulomatosis on immunosuppressive therapy. Bronchiectasis.  5. CKD 4  6. Coronary artery disease on Plavix.  7. Ischemic cardiomyopathy.

## 2015-04-07 NOTE — Telephone Encounter (Signed)
This medication is being monitored by renal so they should be the ones refilling it

## 2015-04-09 ENCOUNTER — Ambulatory Visit (INDEPENDENT_AMBULATORY_CARE_PROVIDER_SITE_OTHER)
Admission: RE | Admit: 2015-04-09 | Discharge: 2015-04-09 | Disposition: A | Discharge status: Home / Self Care | Payer: Medicare Other | Source: Ambulatory Visit | Attending: Internal Medicine | Admitting: Internal Medicine

## 2015-04-09 ENCOUNTER — Ambulatory Visit (INDEPENDENT_AMBULATORY_CARE_PROVIDER_SITE_OTHER): Payer: Medicare Other | Admitting: Internal Medicine

## 2015-04-09 ENCOUNTER — Encounter: Payer: Self-pay | Admitting: Internal Medicine

## 2015-04-09 VITALS — BP 118/64 | HR 94 | Ht 67.0 in | Wt 119.2 lb

## 2015-04-09 DIAGNOSIS — J479 Bronchiectasis, uncomplicated: Secondary | ICD-10-CM | POA: Diagnosis not present

## 2015-04-09 DIAGNOSIS — M3130 Wegener's granulomatosis without renal involvement: Secondary | ICD-10-CM | POA: Diagnosis not present

## 2015-04-09 DIAGNOSIS — R0602 Shortness of breath: Secondary | ICD-10-CM | POA: Diagnosis not present

## 2015-04-09 MED ORDER — ALBUTEROL SULFATE HFA 108 (90 BASE) MCG/ACT IN AERS: 2.0000 | INHALATION_SPRAY | Freq: Four times a day (QID) | RESPIRATORY_TRACT | Status: DC | PRN

## 2015-04-09 NOTE — Patient Instructions (Addendum)
No change medications  Please remember to go to the   x-ray department downstairs for your tests - we will call you with the results when they are available  Please schedule a follow up visit in 3 months but call sooner if needed

## 2015-04-09 NOTE — Progress Notes (Signed)
Subjective:    Patient ID: Richard [name], male    DOB: 07-24-1936     MRN: [id number]   Brief patient profile:  31  yowm never smoker with dx of Longstanding bronchiectasis then dx with WG 03/2010 > requested establish with Chaye Misch.   Admit [facility] dx WG by pos c anca 03/2010, supportive tbbx but not vats   Admit [name] dx spont L Ptx 9/08/15/09 requiring Chest tube but no surgery   05/24/10--NP ov/ med reivew. Since last visit. has been hospitalized 05/12/2010-- 10/10/2011for Right pneumothorax., with underlying Wegener granulomatosis. and . Bronchiectasis. Pt was found to have Right Pneumo. Chest tube was placed. with improvement and removed 10/7. Xray on 10/10 w/ resolved pnuemo. He was continued on cytoxan and bactrim for PCP prophylaxis. rec. Prednisone 20 mg reduce to one half daily  Aspirin 81 mg one daily with breakfast but stop for any bleeding  See Patient Care Coordinator before leaving for cardiology appt > saw [name], ? LHC needed       11/23/2012 f/u ov/Shadow Stiggers re WG/ bronchiectasis Chief Complaint  Patient presents with  . Follow-up    Had PFT today-sob same,cough-yellow,wheezing occass.,  no hemoptysis, no real limiting sob though sedentary, no arthralgias or rash maintaining 2.5 mg daily No change prednisone 5 mg one half daily  > ok to try every other day if doing great May 1 flare rx levaquin and pred May 16 lap chole hoxworth     08/19/2013 f/u ov/Maizy Davanzo re: ? Recurrent WG Chief Complaint  Patient presents with  . Follow-up    Breathing is unchanged. Cough unchanged-prod cpugh w/ yellow phlem. On 2nd dose of cipro  now on day 5/10 cipro and less dark, never bloody, no sinus complaints, arthritis rash or fever or hematuria. >ANCA 1:80 titer, started on cytoxan        09/27/2013 f/u ov/Shaylynn Nulty re:  WG on pred 10 and ctx 50 mg daily  Chief Complaint  Patient presents with  . Follow-up    Pt states cough and SOB seems worse since the last visit. Still producing yellow  sputum. He states has low grade temp every am.   assoc with nasal congestion but no epistaxis or hemoptysis  rec Stop Zebeta Schedule sinus CT> 2/09/14/13 Mild to moderate mucosal thickening in the paranasal sinuses without  air-fluid level. For cough use the mucinex dm up to 1200 mg every 12 hours and as much flutter valve as possible For breathing use xopenex up to 2 puffs every 4 hours if needed and don't leave home wihout it   Admit date: 10/04/2013  Discharge date: 10/07/2013  Discharge Diagnoses:  SOB (shortness of breath)  Wegener's granulomatosis  Bronchiectasis with acute exacerbation  SVT (supraventricular tachycardia)  Cough  Weakness generalized  Discharge Condition: Stable and improved  Filed Weights    10/04/13 1539   Weight:  58.423 kg (128 lb 12.8 oz)   History of present illness:  Patient is a pleasant 80 year old white man with a past medical history significant for Wegener's granulomatosis diagnosed in 2011 was on Cytoxan until 2013. He follows with Dr. [name]. He is maintained on prednisone 10 mg daily. He had been feeling well until mid January at which point a chest x-ray showed increased nodules and Dr. [name] decided to restart Cytoxan. Patient came into the hospital with the above-mentioned complaints. His heart rate was noted to be in the 180s in the rhythm of SVT. He converted to sinus rhythm spontaneously. CT scan of  the chest shows bronchiectasis as well as numerous cavitary and non-cavitary nodules. We have been asked to admit him for further evaluation and management. In addition to his cough and mild shortness of breath, he endorses low-grade temperatures for about 2 weeks as well as increasing sinus symptoms.  Hospital Course:  Wegener's Granulomatosis/Bronchiectasis with Acute Exacerbation  -Cx data remains negative to date.  -As discussed with Dr. [name], will transition antibiotics to cipro for 3 more days, continue cytoxan (bactrim while on cytoxan for PCP  prophylaxis), and slow steroid taper until seen again by Dr. [name].  SVT  -Spontaneously converted to NSR.          12/06/13 to ER with dehyrdration   01/09/2014 f/u ov/Cruise Baumgardner re:  Chief Complaint  Patient presents with  . Follow-up    Pt c/o non prod cough and states that his breathing is no better. He c/o poor appetite.   rec If mucus gets nasty,  Levaquin 750 x 7 days If mucus gets thick >  mucinex dm 1200 mg every 12 h and use the flutter valve   02/12/2014 f/u ov/Dravin Lance re: bronchiectasis / pred 10 / cyt 2 Chief Complaint  Patient presents with  . Follow-up    Pt states that his symptoms are unchanged since last visit. Finished round of levaquin x 2 days ago.  No new co's today.   ok at [employer] leaning on the cart  New occ chest discomfort when sob x sev months, goes away at rest. No assoc diaph, nausea, radiation Has never tried xopenex for this symptom to date, encouraged to do so rec Please see patient coordinator before you leave today  to schedule VEST Tdap today      Patient ID: Richard [name]  MRN: [id number], DOB/AGE: 05-Nov-1935 79 y.o.  Admit date: 03/13/2014  D/C date: 03/26/2014   Primary Cardiologist: Dr. Debara Pickett  Principal Problem:  Unstable Class III Angina  Active Problems:  HYPOTHYROIDISM  HYPERLIPIDEMIA  ANEMIA  Wegener's granulomatosis  Chronic respiratory failure  Abnormal nuclear stress test: Intermediate risk with moderate region of apical and inferior scar with mild superimposed ischemia; EF 40%  CAD- severe 4 V CAD- not CABG candidate  Anemia- transfused 03/16/14  Chronic renal insufficiency, stage III (moderate)  Cardiomyopathy, ischemic- EF 40% by Myoview  Acute blood loss anemia  Femoral artery pseudo-aneurysm, right  Admission Dates: 03/13/14 - 03/26/14  Discharge Diagnosis: [place] s/p [facility] x3 and ultimately BMS to pLAD& POBA to CTO of circumflex/marginal vessel on 03/17/14 and staged BMS to mRCA on 03/21/14  HPI: Richard [name] is a 79 y.o. male  with a history of hypothyroidism, HLD, anemia, Wegener's granulomatosis and no prior cardiac disease who presented to [facility] ED on 03/13/14 with chest pain and was transferred to [facility] for cath the following morning.  PMH significant for Wegener's granulomatosis and follows with Dr. [name] for his pulmonary disease (severe multifocal bronchiectasis as well as numerous cavitary and non-cavitary nodules). He had been having chest tightness with significant exertion for the last few months with increasing dyspnea on exertion. Also had 2 brothers who both had CABG. Referred to Dr. Debara Pickett who proceeded with nuclear stress testing which demonstrated a "moderate region of apical and inferior scar with mild superimposed qualitative ischemia". LV wall motion demonstrated a "moderate degree of hypokinesis involving the mid-distal inferior wall and apex with an EF of 40%". Prior echo dated 03/30/2010 showed EF 45-50%.  Hospital Course: He presented to [facility] ED with increasing fatigue  and chest tightness which developed that morning, which resolved within 10 minutes.  He was given ASA, heparin, and nitro paste. ECG demonstrated sinus tachycardia with no acute ST-T abnormalities. POC troponin normal. Also noted to be anemic, Hgb 9 (10 on 12/06/13). Dr. [name] noted that Mr. Mayhall has had hemoptysis in the past, therefore limiting DAPT was ideal and BMS over DES thought to be the best option.  [place]/CAD:  -- Abnormal outpatient nuclear stress test on 03/12/14: Intermediate risk with moderate region of apical and inferior scar with mild superimposed ischemia; EF 40%  -- s/p first LHC on 03/14/14 which revealed severe multivessel CAD involving all 4 major branches. Seen by Dr. Madolyn Frieze on this admission who deemed him unsuitable for CABG due to underlying comorbid conditions. ( sts score >30). It was elected to proceed with staged multivessel PCI .  -- s/p LHC on 03/17/14 with successful PCI to the LAD and POBA to a chronically occluded LCX. There  remained high grade proximal disease to a large proximal Ramus branch as well as mid-RCA disease so staged PCI was planned at a later date due to pulmonary issues and renal dysfunction. This was further complicated by right femoral artery pseudoaneurysm requiring repair. LHC on 03/21/14 s/p PCI with rotablator atherectomy with BMS to the mid RCA. Due to length of fluoro time and large volume of contrast, it was decided to medically manage the ramus stenosis which Dr. Angelena Form felt was moderately stenosed and if he has recurrent anginal then consider PCI.  -- Continue ASA/BB/Plavix/statin  Right femoral pseudoaneurysm: s/p suture repair of femoral artery with evacuation of hematoma on 03/19/14. Still with significant hematoma down buttox and hamstring but improving. CBC stable at 7.7.  -- Has follow up with Dr. Kellie Simmering on 04/08/2014  Anemia: subsequent to right femoral pseudoaneurysm- s/p surgical repair. S/p blood transfusion x 2. Hgb improved from 7.4 to 8.6. Stable at 7.7 on discharge.  --Will send home on Iron 324 mg daily with colace 100 mg BID to help prevent constipation  SOB - BNP elevated at 4937. D-Dimer elevated at 2.6. CTA with no acute PE. No s/s of volume overload  -- Though to be possibly due to anemia. Chest xray showed multiple cavitary lesions but no CHF. CTA with multiple abnormalities possibly reflecting acute bronchitis vs organizing PNA. PCCM (followed by Dr. [name]) consulted who felt that the CT was at his baseline and to continue current therapy. He thought his SOB was likely multifactorial and anemia also playing a role.  -- BNP elevated at 4937. D-Dimer elevated at 2.6. CTA with no acute PE. No s/s of volume overload  Chronic systolic CHF- 2 D ECHO with LVEF 40-45%, mid to distal inferoapical and apical hypokinesis, diastolic dysfunction with elevated LV filling pressure, mild to moderate RV hypokinesis.  Wegener's granulomatosis. ( per PCCM)  -- Continue cytoxan, prednisone  --  Bactrim for PCP prophylaxis  -- Will need pulmonary rehab as an outpatient  Chronic renal insufficiency, stage III (moderate).  -- Creat elevated but stable after contrast exposure for CTA  -- Creat stable at 1.48 today. Continue to monitor  The patient has had a prolonged hospital course but is recovering well. The femoral catheter site is stable. He has been seen by Dr. Irish Lack today and deemed ready for discharge home. All follow-up appointments have been scheduled. Discharge medications are listed below.     04/11/2014 ext post hosp  f/u ov/Allison Deshotels re: bronchiectasis / angina resolved p stent Chief Complaint  Patient presents with  . Follow-up    couging yellowish   Not using vest yet due to concerns with stent and plans on talking to cards first. cipro best recent rx for purulent sputum but doesn't have a prn on hand  maint on pred 10 mg per day and cytoxan 50  rec Cipro 500 mg twice daily x 10 days as needed for nasty mucus    05/23/2014 f/u ov/Bear Osten re: WG on cytoxan 50/ pred 10 with CRI f/u by nephrology  Chief Complaint  Patient presents with  . Follow-up    Wegener's granulomatosis; SOB, cough, spitting up yellow sputum, finished Levaquin 1 week ago  feels levaquin may work a little better than cipro No need for saba Not limited by breathing from desired activities  As by gen weakness  No hemoptysis   rec No change in prednisone or cytoxan dosing Have your kidney doctor send me the results of your labs if not in the [facility] ([facility]) Please remember to go to the  x-ray department downstairs for your tests    08/19/2014 acute  ov/Novalie Leamy re: ? pna vs flare of bronchiectasis  Chief Complaint  Patient presents with  . Acute Visit    Pt c/o chest congestion that started on 08/16/14- started on levaquin and then next day started having CP.  He states that his breathing seems to be slightly worse than usual for him.   one week acute onset  sense of building up fluid/ congestion in  chest" just like many times before "with change in sputum color ? Red ? Started levaquin 08/16/14 then new pattern of cp 1/11 = moderate/ severe gen bilateral  Ant worse with deep breathing or  coughing or exp to cold air   Did not try xopenex or ntg/ stopped levaquin and pain resolved Pain was like a tightness s  radiation or assoc sweathing nausea or vomiting  rec Increase prednisone to 15 mg per day until better then back to 7.5 mg daily  Augmentin 875 mg take one pill twice daily  X 10 days - take at breakfast and supper with large glass of water.  It would help reduce the usual side effects (diarrhea and yeast infections) if you ate cultured yogurt at lunch.  For breathing/coughing/ congestion >  Up to 2 puff every 4 hours as needed  Make sure you are taking pepcid 20 mg after bfast and supper     09/02/2014 Follow up : Bronchiectasis/NP Patient returns for a two-week follow-up. Last visit. Patient with a bronchiectatic exacerbation. He was treated with Augmentin 10 days and a prednisone burst. Patient has started to slowly feel improved with decreased discolored mucus. Shortness of breath is not quite as bad. He has made very slow progress. He denies any hemoptysis, orthopnea, PND, leg swelling or calf pain. Appetite is fair. No nausea, vomiting or diarrhea. Chest x-ray last visit showed chronic changes rec Continue on current regimen  Continue with VEST therapy.     10/30/2014 f/u ov/Karyl Sharrar re: bronchiectasis/AB Chief Complaint  Patient presents with  . Follow-up    Pt c/o increased SOB and chest congestion for the past 5 days. Cough is prod with yellow sputum.     rx with levaquin x 2 days but only used saba twice ? How much can you use it if you are having a bad day with your breathing A Not sure Baseline = sob x more than slow adls rec Increase prednisone to 15 mg per  day until better then back to 7.5 mg daily  Augmentin 875 mg take one pill twice daily  X 10 days - take at  breakfast and supper with large glass of water.  It would help reduce the usual side effects (diarrhea and yeast infections) if you ate cultured yogurt at lunch.  For breathing/coughing/ congestion >  Up to 2 puff every 4 hours as needed  Make sure you are taking pepcid 20 mg after bfast and supper  See Tammy NP w/in 2 weeks with all your medications > did not do    04/09/2015 f/u ov/Abhimanyu Cruces re: bronchiectasis/ ab on pred 7.5 /cyt per renal for [facility] Chief Complaint  Patient presents with  . Follow-up    Pt states his breathing has been worse for the past month. He states that he has seen cards and had neg w/u.      Worse doe x 4 m indolent onset/ min progression   Doe x mmrc 1  Ok nl pace as long as flat grade/indoors No cough / vest using but not bring much up / rotating cipro and levaquin avg once a month    now on hc03 per renal  No obvious day to day or daytime variabilty or assoc increase in chronic cough or excess/purulent sputum cp or chest tightness, subjective wheeze overt sinus or hb symptoms. No unusual exp hx or h/o childhood pna/ asthma or knowledge of premature birth.  Sleeping ok without nocturnal  or early am exacerbation  of respiratory  c/o's or need for noct saba. Also denies any obvious fluctuation of symptoms with weather or environmental changes or other aggravating or alleviating factors except as outlined above   Current Medications, Allergies, Complete Past Medical History, Past Surgical History, Family History, and Social History were reviewed in [employer] record.  ROS  The following are not active complaints unless bolded sore throat, dysphagia, dental problems, itching, sneezing,  nasal congestion or excess/ purulent secretions, ear ache,   fever, chills, sweats, unintended wt loss, pleuritic or exertional cp, hemoptysis,  orthopnea pnd or leg swelling, presyncope, palpitations, heartburn, abdominal pain, anorexia, nausea, vomiting, diarrhea  or  change in bowel or urinary habits, change in stools or urine, dysuria,hematuria,  rash, arthralgias, visual complaints, headache, numbness weakness or ataxia or problems with walking or coordination,  change in mood/affect or memory.             Objective:   Physical Exam  wt   128  04/26/11 >07/08/2011  134> 134 11/28/2011 > 02/29/2012 136> 05/31/2012  136>  134 11/22/12 > 07/26/2013  136 > 08/21/2013  136 >133 09/06/2013 >  09/12/2013  133 > 132 09/27/2013 > 10/15/2013 131 > 11/07/2013  128 > 01/09/14 124 > 02/12/2014  123 > 04/11/2014   117 > 05/23/2014 119 > 08/19/2014 110 > 10/30/2014  120 >  04/09/2015 120   amb thin pleasant wm nad  SKIN: no rash, lesions  NODES: no lymphadenopathy  HEENT: [place]/AT, EOM- WNL, Conjuctivae- clear, PERRLA, TM-WNL, Nose- clear, Throat- clear and wnl, Mallampati III  NECK: Supple w/ fair ROM, JVD- none, normal carotid impulses w/o bruits Thyroid-  CHEST: minimal exp rhonchi bilaterally  No resp distress HEART: RRR, no m/g/r heard  ABDOMEN: Soft and nl EXT:  No deformities or restrucitons      I personally reviewed images and agree with radiology impression as follows:  CXR: 04/09/2015 1. Stable hyperinflation and parenchymal fibrotic changes. There is no acute cardiopulmonary abnormality.  2. Moderate-sized hiatal hernia.    Labs reviewed Lab Results  Component Value Date   WBC 5.8 03/26/2015   HGB 8.3* 04/07/2015   HCT 24.5* 03/26/2015   MCV 111.9* 03/26/2015   PLT 232 03/26/2015       Chemistry      Component Value Date/Time   NA 142 03/24/2015 0919   K 4.1 03/24/2015 0919   CL 107 03/24/2015 0919   CO2 23 03/24/2015 0919   BUN 38* 03/24/2015 0919   CREATININE 1.92* 03/24/2015 0919   CREATININE 2.35* 07/16/2014 0540      Component Value Date/Time   CALCIUM 7.9* 03/24/2015 0919   ALKPHOS 82 03/24/2015 0919   AST 12 03/24/2015 0919   ALT 8* 03/24/2015 0919   BILITOT 0.4 03/24/2015 0919          Assessment & Plan:

## 2015-04-10 ENCOUNTER — Encounter: Payer: Self-pay | Admitting: Internal Medicine

## 2015-04-10 NOTE — Assessment & Plan Note (Signed)
02/12/2014  Walked RA x 3 laps @ 185 ft each stopped due to end of study, no sob and no cp/ no ekg changes p ex  -04/09/2015  Walked RA x 3 laps @ 185 ft each stopped due to End of study, nl pace, min sob / no desat    Strongly suspect this is due to the combination of anemia and mild metabolic acidosis related to renal failure and don't believe any change in his medications is needed at this point from my perspective

## 2015-04-10 NOTE — Progress Notes (Signed)
Quick Note:  Spoke with pt and notified of results per Dr. [name]. Pt verbalized understanding and denied any questions.  ______ 

## 2015-04-10 NOTE — Assessment & Plan Note (Signed)
-    HFA 90% p coaching 12/16/2010   -   PFT's 06/21/11   FEV1  1.60 (61%) ratio 48 and 10% better p B2,  DLCO 83%  - CT 10/04/13 severe multifocal bronchiectasis as well as numerous cavitary and      non-cavitary nodules  -   PFT's 11/22/2012 FEV1  1.52 (62%) arnd 46 and no change p B2 DLCO 89%  -    VEST rec 02/12/2014 > reported improvement 05/23/14   Adequate control on present rx, reviewed > no change in rx needed  - when he does get purulent sputum he goes ahead and implements the action plan of either a course of Cipro or Levaquin  I had an extended discussion with the patient reviewing all relevant studies completed to date and  lasting 15 to 20 minutes of a 25 minute visit    Each maintenance medication was reviewed in detail including most importantly the difference between maintenance and prns and under what circumstances the prns are to be triggered using an action plan format that is not reflected in the computer generated alphabetically organized AVS.    Please see instructions for details which were reviewed in writing and the patient given a copy highlighting the part that I personally wrote and discussed at today's ov.

## 2015-04-10 NOTE — Assessment & Plan Note (Signed)
-  Dx by TBBX/ pos anca 03/2010 > CTX started    - Off all prednisone with baseline esr 31 11/16/2010  > restarted 12/16/2010 with nausea and wt down to 115 but ok sats walking x 3 laps   - Recheck ANCA sent  12/16/2010  >  neg   -Trial off cytoxan starting 02/12/2011 >   03/16/11 Cytoxan stopped  - Prednisone ceiling is 10 mg per day and floor is 5 mg daily as of 04/26/13  - 08/19/13 ANCA POS 1:80 with increasing nodular dz > rx 08/21/2013 cytoxan 50 mg daily  - Started bactrim ds one daily 10/07/13> all care directed by RENAL

## 2015-04-18 ENCOUNTER — Encounter (HOSPITAL_COMMUNITY): Payer: Self-pay | Admitting: *Deleted

## 2015-04-18 ENCOUNTER — Emergency Department (HOSPITAL_COMMUNITY): Payer: Medicare Other

## 2015-04-18 ENCOUNTER — Inpatient Hospital Stay (HOSPITAL_COMMUNITY)
Admission: EM | Admit: 2015-04-18 | Discharge: 2015-04-21 | DRG: 193 | Disposition: A | Discharge status: Home / Self Care | Payer: Medicare Other | Attending: Internal Medicine | Admitting: Internal Medicine

## 2015-04-18 DIAGNOSIS — I2510 Atherosclerotic heart disease of native coronary artery without angina pectoris: Secondary | ICD-10-CM | POA: Diagnosis present

## 2015-04-18 DIAGNOSIS — Z681 Body mass index (BMI) 19 or less, adult: Secondary | ICD-10-CM

## 2015-04-18 DIAGNOSIS — H9193 Unspecified hearing loss, bilateral: Secondary | ICD-10-CM | POA: Diagnosis present

## 2015-04-18 DIAGNOSIS — Z7902 Long term (current) use of antithrombotics/antiplatelets: Secondary | ICD-10-CM

## 2015-04-18 DIAGNOSIS — N184 Chronic kidney disease, stage 4 (severe): Secondary | ICD-10-CM | POA: Diagnosis not present

## 2015-04-18 DIAGNOSIS — N189 Chronic kidney disease, unspecified: Secondary | ICD-10-CM | POA: Diagnosis not present

## 2015-04-18 DIAGNOSIS — E785 Hyperlipidemia, unspecified: Secondary | ICD-10-CM | POA: Diagnosis present

## 2015-04-18 DIAGNOSIS — M3131 Wegener's granulomatosis with renal involvement: Secondary | ICD-10-CM | POA: Diagnosis not present

## 2015-04-18 DIAGNOSIS — I5022 Chronic systolic (congestive) heart failure: Secondary | ICD-10-CM | POA: Diagnosis present

## 2015-04-18 DIAGNOSIS — Z8249 Family history of ischemic heart disease and other diseases of the circulatory system: Secondary | ICD-10-CM | POA: Diagnosis not present

## 2015-04-18 DIAGNOSIS — D631 Anemia in chronic kidney disease: Secondary | ICD-10-CM | POA: Diagnosis present

## 2015-04-18 DIAGNOSIS — I129 Hypertensive chronic kidney disease with stage 1 through stage 4 chronic kidney disease, or unspecified chronic kidney disease: Secondary | ICD-10-CM | POA: Diagnosis present

## 2015-04-18 DIAGNOSIS — Z7952 Long term (current) use of systemic steroids: Secondary | ICD-10-CM | POA: Diagnosis not present

## 2015-04-18 DIAGNOSIS — Z7982 Long term (current) use of aspirin: Secondary | ICD-10-CM | POA: Diagnosis not present

## 2015-04-18 DIAGNOSIS — E039 Hypothyroidism, unspecified: Secondary | ICD-10-CM | POA: Diagnosis present

## 2015-04-18 DIAGNOSIS — Z9861 Coronary angioplasty status: Secondary | ICD-10-CM

## 2015-04-18 DIAGNOSIS — Z7951 Long term (current) use of inhaled steroids: Secondary | ICD-10-CM | POA: Diagnosis not present

## 2015-04-18 DIAGNOSIS — K219 Gastro-esophageal reflux disease without esophagitis: Secondary | ICD-10-CM | POA: Diagnosis present

## 2015-04-18 DIAGNOSIS — Z9981 Dependence on supplemental oxygen: Secondary | ICD-10-CM | POA: Diagnosis not present

## 2015-04-18 DIAGNOSIS — R0602 Shortness of breath: Secondary | ICD-10-CM | POA: Diagnosis present

## 2015-04-18 DIAGNOSIS — Z825 Family history of asthma and other chronic lower respiratory diseases: Secondary | ICD-10-CM | POA: Diagnosis not present

## 2015-04-18 DIAGNOSIS — J189 Pneumonia, unspecified organism: Secondary | ICD-10-CM | POA: Diagnosis present

## 2015-04-18 DIAGNOSIS — K449 Diaphragmatic hernia without obstruction or gangrene: Secondary | ICD-10-CM | POA: Diagnosis present

## 2015-04-18 DIAGNOSIS — J470 Bronchiectasis with acute lower respiratory infection: Secondary | ICD-10-CM

## 2015-04-18 DIAGNOSIS — E43 Unspecified severe protein-calorie malnutrition: Secondary | ICD-10-CM | POA: Diagnosis present

## 2015-04-18 DIAGNOSIS — N400 Enlarged prostate without lower urinary tract symptoms: Secondary | ICD-10-CM | POA: Diagnosis present

## 2015-04-18 DIAGNOSIS — I724 Aneurysm of artery of lower extremity: Secondary | ICD-10-CM | POA: Diagnosis present

## 2015-04-18 DIAGNOSIS — I255 Ischemic cardiomyopathy: Secondary | ICD-10-CM | POA: Diagnosis present

## 2015-04-18 DIAGNOSIS — E860 Dehydration: Secondary | ICD-10-CM | POA: Diagnosis present

## 2015-04-18 DIAGNOSIS — J479 Bronchiectasis, uncomplicated: Secondary | ICD-10-CM | POA: Diagnosis not present

## 2015-04-18 LAB — EXPECTORATED SPUTUM ASSESSMENT W REFEX TO RESP CULTURE

## 2015-04-18 LAB — CBC
HCT: 29.2 % — ABNORMAL LOW (ref 39.0–52.0)
HEMOGLOBIN: 9.6 g/dL — AB (ref 13.0–17.0)
MCH: 36.5 pg — AB (ref 26.0–34.0)
MCHC: 32.9 g/dL (ref 30.0–36.0)
MCV: 111 fL — ABNORMAL HIGH (ref 78.0–100.0)
PLATELETS: 176 10*3/uL (ref 150–400)
RBC: 2.63 MIL/uL — AB (ref 4.22–5.81)
RDW: 15.1 % (ref 11.5–15.5)
WBC: 8.7 10*3/uL (ref 4.0–10.5)

## 2015-04-18 LAB — COMPREHENSIVE METABOLIC PANEL
ALBUMIN: 3.1 g/dL — AB (ref 3.5–5.0)
ALK PHOS: 82 U/L (ref 38–126)
ALT: 10 U/L — AB (ref 17–63)
AST: 15 U/L (ref 15–41)
Anion gap: 6 (ref 5–15)
BUN: 36 mg/dL — ABNORMAL HIGH (ref 6–20)
CHLORIDE: 109 mmol/L (ref 101–111)
CO2: 23 mmol/L (ref 22–32)
CREATININE: 2.18 mg/dL — AB (ref 0.61–1.24)
Calcium: 8.4 mg/dL — ABNORMAL LOW (ref 8.9–10.3)
GFR calc non Af Amer: 28 mL/min — ABNORMAL LOW (ref >60)
GFR, EST AFRICAN AMERICAN: 32 mL/min — AB (ref >60)
GLUCOSE: 100 mg/dL — AB (ref 65–99)
Potassium: 4.2 mmol/L (ref 3.5–5.1)
SODIUM: 138 mmol/L (ref 135–145)
Total Bilirubin: 0.9 mg/dL (ref 0.3–1.2)
Total Protein: 5.8 g/dL — ABNORMAL LOW (ref 6.5–8.1)

## 2015-04-18 LAB — EXPECTORATED SPUTUM ASSESSMENT W GRAM STAIN, RFLX TO RESP C

## 2015-04-18 LAB — I-STAT CG4 LACTIC ACID, ED: Lactic Acid, Venous: 1.16 mmol/L (ref 0.5–2.0)

## 2015-04-18 MED ORDER — ALBUTEROL SULFATE (2.5 MG/3ML) 0.083% IN NEBU: 2.5000 mg | INHALATION_SOLUTION | Freq: Four times a day (QID) | RESPIRATORY_TRACT | Status: DC

## 2015-04-18 MED ORDER — METHYLCELLULOSE (LAXATIVE) PO POWD: 1.0000 | Freq: Every day | ORAL | Status: DC

## 2015-04-18 MED ORDER — PSYLLIUM 95 % PO PACK
1.0000 | PACK | Freq: Every day | ORAL | Status: DC
  Administered 2015-04-18 – 2015-04-21 (×4): 1 via ORAL
  Filled 2015-04-18 (×4): qty 1

## 2015-04-18 MED ORDER — GUAIFENESIN-DM 100-10 MG/5ML PO SYRP: 5.0000 mL | ORAL_SOLUTION | ORAL | Status: DC | PRN

## 2015-04-18 MED ORDER — ENOXAPARIN SODIUM 30 MG/0.3ML ~~LOC~~ SOLN
30.0000 mg | SUBCUTANEOUS | Status: DC
  Administered 2015-04-18 – 2015-04-20 (×3): 30 mg via SUBCUTANEOUS
  Filled 2015-04-18 (×3): qty 0.3

## 2015-04-18 MED ORDER — CYCLOPHOSPHAMIDE 50 MG PO CAPS: 50.0000 mg | ORAL_CAPSULE | Freq: Every day | ORAL | Status: DC

## 2015-04-18 MED ORDER — INFLUENZA VAC SPLIT QUAD 0.5 ML IM SUSY
0.5000 mL | PREFILLED_SYRINGE | INTRAMUSCULAR | Status: AC
  Administered 2015-04-20: 0.5 mL via INTRAMUSCULAR
  Filled 2015-04-18 (×2): qty 0.5

## 2015-04-18 MED ORDER — LORATADINE 10 MG PO TABS
10.0000 mg | ORAL_TABLET | Freq: Every day | ORAL | Status: DC
  Administered 2015-04-18 – 2015-04-21 (×4): 10 mg via ORAL
  Filled 2015-04-18 (×4): qty 1

## 2015-04-18 MED ORDER — TAMSULOSIN HCL 0.4 MG PO CAPS
0.4000 mg | ORAL_CAPSULE | Freq: Every evening | ORAL | Status: DC
  Administered 2015-04-18 – 2015-04-20 (×3): 0.4 mg via ORAL
  Filled 2015-04-18 (×3): qty 1

## 2015-04-18 MED ORDER — SIMVASTATIN 20 MG PO TABS
20.0000 mg | ORAL_TABLET | Freq: Every day | ORAL | Status: DC
  Administered 2015-04-18 – 2015-04-20 (×3): 20 mg via ORAL
  Filled 2015-04-18 (×3): qty 1

## 2015-04-18 MED ORDER — FINASTERIDE 5 MG PO TABS
5.0000 mg | ORAL_TABLET | Freq: Every day | ORAL | Status: DC
Start: 2015-04-18 — End: 2015-04-21
  Administered 2015-04-18 – 2015-04-21 (×4): 5 mg via ORAL
  Filled 2015-04-18 (×4): qty 1

## 2015-04-18 MED ORDER — ALBUTEROL SULFATE (2.5 MG/3ML) 0.083% IN NEBU: 2.5000 mg | INHALATION_SOLUTION | RESPIRATORY_TRACT | Status: DC | PRN

## 2015-04-18 MED ORDER — BISACODYL 5 MG PO TBEC
5.0000 mg | DELAYED_RELEASE_TABLET | Freq: Two times a day (BID) | ORAL | Status: DC
  Administered 2015-04-18 – 2015-04-21 (×6): 5 mg via ORAL
  Filled 2015-04-18 (×6): qty 1

## 2015-04-18 MED ORDER — CLOPIDOGREL BISULFATE 75 MG PO TABS
75.0000 mg | ORAL_TABLET | Freq: Every day | ORAL | Status: DC
  Administered 2015-04-18 – 2015-04-21 (×4): 75 mg via ORAL
  Filled 2015-04-18 (×4): qty 1

## 2015-04-18 MED ORDER — VANCOMYCIN HCL 500 MG IV SOLR
500.0000 mg | INTRAVENOUS | Status: DC
  Administered 2015-04-18 – 2015-04-20 (×3): 500 mg via INTRAVENOUS
  Filled 2015-04-18 (×4): qty 500

## 2015-04-18 MED ORDER — FAMOTIDINE 20 MG PO TABS
20.0000 mg | ORAL_TABLET | Freq: Two times a day (BID) | ORAL | Status: DC
  Administered 2015-04-18 – 2015-04-21 (×6): 20 mg via ORAL
  Filled 2015-04-18 (×6): qty 1

## 2015-04-18 MED ORDER — BUDESONIDE-FORMOTEROL FUMARATE 160-4.5 MCG/ACT IN AERO
2.0000 | INHALATION_SPRAY | Freq: Two times a day (BID) | RESPIRATORY_TRACT | Status: DC
  Administered 2015-04-19 – 2015-04-21 (×5): 2 via RESPIRATORY_TRACT
  Filled 2015-04-18 (×2): qty 6

## 2015-04-18 MED ORDER — SODIUM CHLORIDE 0.9 % IJ SOLN
3.0000 mL | Freq: Two times a day (BID) | INTRAMUSCULAR | Status: DC
  Administered 2015-04-19 – 2015-04-21 (×3): 3 mL via INTRAVENOUS

## 2015-04-18 MED ORDER — SODIUM CHLORIDE 0.9 % IV SOLN
INTRAVENOUS | Status: DC
  Administered 2015-04-18: 16:00:00 via INTRAVENOUS

## 2015-04-18 MED ORDER — IPRATROPIUM-ALBUTEROL 0.5-2.5 (3) MG/3ML IN SOLN
3.0000 mL | Freq: Four times a day (QID) | RESPIRATORY_TRACT | Status: DC
  Administered 2015-04-18: 3 mL via RESPIRATORY_TRACT
  Filled 2015-04-18: qty 3

## 2015-04-18 MED ORDER — ASPIRIN EC 81 MG PO TBEC
81.0000 mg | DELAYED_RELEASE_TABLET | Freq: Every day | ORAL | Status: DC
  Administered 2015-04-18 – 2015-04-21 (×4): 81 mg via ORAL
  Filled 2015-04-18 (×4): qty 1

## 2015-04-18 MED ORDER — CALCIUM CITRATE-VITAMIN D 315-200 MG-UNIT PO TABS: 1.0000 | ORAL_TABLET | Freq: Every day | ORAL | Status: DC

## 2015-04-18 MED ORDER — CALCIUM CITRATE-VITAMIN D 500-400 MG-UNIT PO CHEW
1.0000 | CHEWABLE_TABLET | Freq: Every day | ORAL | Status: DC
  Administered 2015-04-19 – 2015-04-21 (×3): 1 via ORAL
  Filled 2015-04-18 (×3): qty 1

## 2015-04-18 MED ORDER — SODIUM CHLORIDE 0.9 % IV SOLN: INTRAVENOUS | Status: AC

## 2015-04-18 MED ORDER — ALBUTEROL SULFATE HFA 108 (90 BASE) MCG/ACT IN AERS
2.0000 | INHALATION_SPRAY | Freq: Four times a day (QID) | RESPIRATORY_TRACT | Status: DC
  Administered 2015-04-18: 2 via RESPIRATORY_TRACT
  Filled 2015-04-18: qty 6.7

## 2015-04-18 MED ORDER — SODIUM BICARBONATE 650 MG PO TABS
1300.0000 mg | ORAL_TABLET | Freq: Two times a day (BID) | ORAL | Status: DC
  Administered 2015-04-18 – 2015-04-21 (×6): 1300 mg via ORAL
  Filled 2015-04-18 (×6): qty 2

## 2015-04-18 MED ORDER — METOPROLOL SUCCINATE ER 25 MG PO TB24
25.0000 mg | ORAL_TABLET | Freq: Every day | ORAL | Status: DC
  Administered 2015-04-18 – 2015-04-21 (×4): 25 mg via ORAL
  Filled 2015-04-18 (×4): qty 1

## 2015-04-18 MED ORDER — CYCLOPHOSPHAMIDE 50 MG PO TABS: 50.0000 mg | ORAL_TABLET | Freq: Every day | ORAL | Status: DC

## 2015-04-18 MED ORDER — LEVOFLOXACIN IN D5W 500 MG/100ML IV SOLN
500.0000 mg | Freq: Once | INTRAVENOUS | Status: DC
  Filled 2015-04-18: qty 100

## 2015-04-18 MED ORDER — ALBUTEROL SULFATE (2.5 MG/3ML) 0.083% IN NEBU
5.0000 mg | INHALATION_SOLUTION | Freq: Once | RESPIRATORY_TRACT | Status: AC
  Administered 2015-04-18: 5 mg via RESPIRATORY_TRACT
  Filled 2015-04-18: qty 6

## 2015-04-18 MED ORDER — PREDNISOLONE 5 MG PO TABS
7.5000 mg | ORAL_TABLET | Freq: Every day | ORAL | Status: DC
  Administered 2015-04-18 – 2015-04-21 (×4): 7.5 mg via ORAL
  Filled 2015-04-18 (×5): qty 2

## 2015-04-18 MED ORDER — GUAIFENESIN ER 600 MG PO TB12
600.0000 mg | ORAL_TABLET | Freq: Two times a day (BID) | ORAL | Status: DC
  Administered 2015-04-18 – 2015-04-21 (×6): 600 mg via ORAL
  Filled 2015-04-18 (×6): qty 1

## 2015-04-18 MED ORDER — ACETAMINOPHEN 650 MG RE SUPP: 650.0000 mg | Freq: Four times a day (QID) | RECTAL | Status: DC | PRN

## 2015-04-18 MED ORDER — DEXTROSE 5 % IV SOLN
1.0000 g | INTRAVENOUS | Status: DC
  Administered 2015-04-18 – 2015-04-20 (×3): 1 g via INTRAVENOUS
  Filled 2015-04-18 (×4): qty 1

## 2015-04-18 MED ORDER — FAMOTIDINE 20 MG PO TABS: 40.0000 mg | ORAL_TABLET | Freq: Two times a day (BID) | ORAL | Status: DC

## 2015-04-18 MED ORDER — IPRATROPIUM-ALBUTEROL 0.5-2.5 (3) MG/3ML IN SOLN
3.0000 mL | Freq: Three times a day (TID) | RESPIRATORY_TRACT | Status: DC
  Administered 2015-04-19: 3 mL via RESPIRATORY_TRACT
  Filled 2015-04-18: qty 3

## 2015-04-18 MED ORDER — NITROGLYCERIN 0.4 MG SL SUBL: 0.4000 mg | SUBLINGUAL_TABLET | SUBLINGUAL | Status: DC | PRN

## 2015-04-18 MED ORDER — ACETAMINOPHEN 325 MG PO TABS: 650.0000 mg | ORAL_TABLET | Freq: Four times a day (QID) | ORAL | Status: DC | PRN

## 2015-04-18 NOTE — Progress Notes (Signed)
ANTIBIOTIC CONSULT NOTE - INITIAL  Pharmacy Consult for vancomycin and ceftazidime Indication: HAP  No Known Allergies  Patient Measurements: weight 54 kg, height 67 inches   Vital Signs: Temp: 98.5 F (36.9 C) (09/10 1209) Temp Source: Oral (09/10 1209) BP: 111/67 mmHg (09/10 1439) Pulse Rate: 94 (09/10 1439) Intake/Output from previous day:   Intake/Output from this shift:    Labs:  Recent Labs  04/18/15 1225  WBC 8.7  HGB 9.6*  PLT 176  CREATININE 2.18*   Estimated Creatinine Clearance: 22.1 mL/min (by C-G formula based on Cr of 2.18). No results for input(s): VANCOTROUGH, VANCOPEAK, VANCORANDOM, GENTTROUGH, GENTPEAK, GENTRANDOM, TOBRATROUGH, TOBRAPEAK, TOBRARND, AMIKACINPEAK, AMIKACINTROU, AMIKACIN in the last 72 hours.   Microbiology: No results found for this or any previous visit (from the past 720 hour(s)).  Medical History: Past Medical History  Diagnosis Date  . GERD (gastroesophageal reflux disease)   . Asthma   . Fungal infection     lungs  . Cataract   . BPH (benign prostatic hyperplasia)     sees Dr. Risa Grill, biopsy June 2015 was benign   . Wegener's granulomatosis     sees Dr. [name]   . Peptic stricture of esophagus   . Anemia   . Hiatal hernia   . Adenomatous colon polyp   . Bronchiectasis   . On home oxygen therapy     uses 2 l/m nasally at bedtime  . HOH (hard of hearing)     bilaterally  . HTN (hypertension) 09/28/2013  . CAD (coronary artery disease)     a. [place] s/p [place] x3 and ultimately BMS to pLAD& POBA to CTO of circumflex/marginal vessel on 03/17/14 and staged BMS to [facility] on 03/21/14  . Femoral artery pseudo-aneurysm, right     a. s/p repair. Follow up with Dr. Kellie Simmering   . Diverticulosis    Assessment: Patient's 79 y.o. M with hx  Bronchiectasis and Wegener's granulomatosis on cytoxan PTA.  He presented to the ED on 9/10 with c/o fever.  CXR showed new right basilar airspace disease.  To start abx for PNA.  Goal of Therapy:   Vancomycin trough level 15-20 mcg/ml  Plan:  - vancomycin 500mg  q24h - ceftazidime 1gm q24h - f/u renal function  Tenaya Hilyer P 04/18/2015,2:40 PM

## 2015-04-18 NOTE — H&P (Addendum)
History and Physical  Richard [name] YOV:[id number] DOB: 1936/03/06 DOA: 04/18/2015  Referring physician: Dr. Carmin Muskrat, EDP PCP: [name], MD  Outpatient Specialists:  1. Pulmonology: Dr. Christinia Gully 2. Cardiology: Dr. Lyman Bishop 3. Nephrology: Dr. Jeneen Rinks Deterding.  Chief Complaint: High fevers, productive cough and worsening dyspnea  HPI: Richard [name] is a 79 y.o. male with history of bronchiectasis, Wegener's granulomatosis with renal involvement-on immunosuppressants and chronic steroids, GERD, BPH, chronic anemia, HTN, CAD, presented to the [facility] ED on 04/18/15 with complaints of high fevers, worsening productive cough and dyspnea. Patient and spouse at bedside provided history. He gives several months history of generalized weakness attributed to anemia which has progressively gotten worse in the last 2 weeks. He chronically has some cough productive of yellow sputum. Since yesterday, he has noted worsening cough with increased sputum production, high fevers up to 104F, worsening dyspnea especially on minimal exertion, generalized weakness and decreased appetite. He usually cycles levofloxacin alternating with ciprofloxacin during such symptoms and started levofloxacin yesterday without significant improvement. He thereby presented to the emergency department. In the ED, transiently hypotensive in the 90s, temperature 98.79F, wheezing, lab work significant for creatinine of 2.18, hemoglobin 9.6, MCV 111 and chest x-ray showed new right basilar airspace disease suggestive of pneumonia. Hospitalist admission was requested for management of pneumonia. Since initiation of treatment in ED, patient states that he feels somewhat better.   Review of Systems: All systems reviewed and apart from history of presenting illness, are negative.  Past Medical History  Diagnosis Date  . GERD (gastroesophageal reflux disease)   . Asthma   . Fungal infection    lungs  . Cataract   . BPH (benign prostatic hyperplasia)     sees Dr. Risa Grill, biopsy June 2015 was benign   . Wegener's granulomatosis     sees Dr. [name]   . Peptic stricture of esophagus   . Anemia   . Hiatal hernia   . Adenomatous colon polyp   . Bronchiectasis   . On home oxygen therapy     uses 2 l/m nasally at bedtime  . HOH (hard of hearing)     bilaterally  . HTN (hypertension) 09/28/2013  . CAD (coronary artery disease)     a. [place] s/p [place] x3 and ultimately BMS to pLAD& POBA to CTO of circumflex/marginal vessel on 03/17/14 and staged BMS to [facility] on 03/21/14  . Femoral artery pseudo-aneurysm, right     a. s/p repair. Follow up with Dr. Kellie Simmering   . Diverticulosis    Past Surgical History  Procedure Laterality Date  . Hernia repair    . Eye surgery      cataracts removed.   . Colonoscopy  08-16-11    per Dr. Fuller Plan, diverticulosis and polyps, repeat in 5 yrs   . Esophagogastroduodenoscopy (egd) with esophageal dilation  11-29-10    per Dr. Fuller Plan   . Cataract extraction, bilateral  12-18-12    bilateral  . Cholecystectomy N/A 12/21/2012    Procedure: LAPAROSCOPIC CHOLECYSTECTOMY WITH INTRAOPERATIVE CHOLANGIOGRAM;  Surgeon: Edward Jolly, MD;  Location: [facility] ORS;  Service: General;  Laterality: N/A;  . Hematoma evacuation Right 03/19/2014    Procedure: Suture repair of femoral artery with evacuation of hematoma;  Surgeon: Mal Misty, MD;  Location: [facility] OR;  Service: Vascular;  Laterality: Right;  . Coronary angioplasty  03/2014  . Left heart catheterization with coronary angiogram N/A 03/14/2014    Procedure: LEFT HEART CATHETERIZATION  WITH CORONARY ANGIOGRAM;  Surgeon: Leonie Man, MD;  Location: [facility] CATH LAB;  Service: Cardiovascular;  Laterality: N/A;  . Percutaneous coronary stent intervention (pci-s) N/A 03/17/2014    Procedure: PERCUTANEOUS CORONARY STENT INTERVENTION (PCI-S);  Surgeon: Troy Sine, MD;  Location: [facility] CATH LAB;  Service: Cardiovascular;  Laterality: N/A;    . Cardiac catheterization  03/17/2014    Procedure: CORONARY BALLOON ANGIOPLASTY;  Surgeon: Troy Sine, MD;  Location: [facility] CATH LAB;  Service: Cardiovascular;;  . Percutaneous coronary stent intervention (pci-s) N/A 03/21/2014    Procedure: PERCUTANEOUS CORONARY STENT INTERVENTION (PCI-S);  Surgeon: Burnell Blanks, MD;  Location: [facility] CATH LAB;  Service: Cardiovascular;  Laterality: N/A;  . Renal biopsy     Social History:  reports that he has never smoked. He has never used smokeless tobacco. He reports that he does not drink alcohol or use illicit drugs. Married. Lives with spouse. Independent of activities of daily living. Denies home oxygen use.  No Known Allergies  Family History  Problem Relation Age of Onset  . Asthma Father   . Coronary artery disease Brother   . Coronary artery disease Brother   . Coronary artery disease Mother   . Colon cancer Neg Hx   . Stomach cancer Neg Hx     Prior to Admission medications   Medication Sig Start Date End Date Taking? Authorizing Provider  albuterol (PROAIR HFA) 108 (90 BASE) MCG/ACT inhaler Inhale 2 puffs into the lungs every 6 (six) hours as needed for wheezing or shortness of breath. 04/09/15  Yes Tanda Rockers, MD  aspirin EC 81 MG tablet Take 81 mg by mouth daily.   Yes Historical Provider, MD  b complex vitamins tablet Take 1 tablet by mouth daily.     Yes Historical Provider, MD  bisacodyl (DULCOLAX) 5 MG EC tablet Take 5 mg by mouth 2 (two) times daily.    Yes Historical Provider, MD  budesonide-formoterol (SYMBICORT) 160-4.5 MCG/ACT inhaler Inhale 2 puffs into the lungs 2 (two) times daily.   Yes Historical Provider, MD  calcium citrate-vitamin D (CITRACAL+D) 315-200 MG-UNIT per tablet Take 1 tablet by mouth daily.   Yes Historical Provider, MD  ciprofloxacin (CIPRO) 500 MG tablet Take 500 mg by mouth 2 (two) times daily as needed (lungs).    Yes Historical Provider, MD  clopidogrel (PLAVIX) 75 MG tablet Take 1 tablet (75  mg total) by mouth daily with breakfast. 05/26/14  Yes Pixie Casino, MD  Cyclophosphamide 50 MG CAPS TAKE ONE CAPSULE BY MOUTH ONCE DAILY ON  AN  EMPTY  STOMACH  ONE  HOUR  BEFORE  OR  2  HOURS  AFTER  MEALS 01/02/15  Yes Tanda Rockers, MD  dextromethorphan (DELSYM) 30 MG/5ML liquid Take 30 mg by mouth 2 (two) times daily as needed for cough.   Yes Historical Provider, MD  finasteride (PROSCAR) 5 MG tablet Take 1 tablet (5 mg total) by mouth daily. 11/24/14  Yes [name], MD  guaiFENesin (MUCINEX) 600 MG 12 hr tablet Take 600 mg by mouth 2 (two) times daily.   Yes Historical Provider, MD  HYDROcodone-homatropine (HYDROMET) 5-1.5 MG/5ML syrup Take 5 mLs by mouth every 4 (four) hours as needed. Patient taking differently: Take 5 mLs by mouth every 4 (four) hours as needed for cough.  11/24/14  Yes [name], MD  levofloxacin (LEVAQUIN) 750 MG tablet TAKE ONE TABLET BY MOUTH ONCE DAILY. STOP IF DEVELOP ACHING IN JOINTS/MUSCLES Patient taking  differently: TAKE ONE TABLET BY MOUTH ONCE DAILY AS NEEDED FOR LUNGS. 03/04/15  Yes Tanda Rockers, MD  loratadine (CLARITIN) 10 MG tablet Take 10 mg by mouth daily.    Yes Historical Provider, MD  methylcellulose (CITRUCEL) oral powder Take 1 packet by mouth daily.   Yes Historical Provider, MD  metoprolol succinate (TOPROL-XL) 25 MG 24 hr tablet Take 1 tablet (25 mg total) by mouth daily. 03/24/15  Yes Troy Sine, MD  nitroGLYCERIN (NITROSTAT) 0.4 MG SL tablet Place 1 tablet (0.4 mg total) under the tongue every 5 (five) minutes x 3 doses as needed for chest pain. 03/24/15  Yes Pixie Casino, MD  prednisoLONE 5 MG TABS tablet Take 7.5 mg by mouth daily.   Yes Historical Provider, MD  ranitidine (ZANTAC) 300 MG tablet Take 1 tablet (300 mg total) by mouth 2 (two) times daily. 04/07/15  Yes Ladene Artist, MD  simvastatin (ZOCOR) 20 MG tablet Take 1 tablet (20 mg total) by mouth daily at 6 PM. 03/24/15  Yes Pixie Casino, MD  sodium bicarbonate 650 MG  tablet TAKE TWO TABLETS BY MOUTH TWICE DAILY 02/10/15  Yes Pixie Casino, MD  tamsulosin (FLOMAX) 0.4 MG CAPS capsule TAKE ONE CAPSULE BY MOUTH EVERY EVENING 03/25/15  Yes [name], MD  traMADol (ULTRAM) 50 MG tablet 1-2 every 4 hours as needed for cough or pain Patient taking differently: Take 50 mg by mouth daily as needed for moderate pain. 1-2 every 4 hours as needed for cough or pain 09/12/13  Yes Tanda Rockers, MD   Physical Exam: Filed Vitals:   04/18/15 1209 04/18/15 1439 04/18/15 1600 04/18/15 1613  BP: 98/53 111/67  114/61  Pulse: 84 94  90  Temp: 98.5 F (36.9 C)   99.2 F (37.3 C)  TempSrc: Oral   Oral  Resp: 22 18  18   Height:   5\' 6"  (1.676 m)   Weight:   52.164 kg (115 lb)   SpO2: 94% 95%  98%     General exam: Moderately built and thinly nourished frail elderly male lying comfortably propped up in bed.  Head, eyes and ENT: Nontraumatic and normocephalic. Pupils equally reacting to light and accommodation. Oral mucosa slightly dry.  Neck: Supple. No JVD, carotid bruit or thyromegaly.  Lymphatics: No lymphadenopathy.  Respiratory system: Reduced breath sounds in both bases, right >left and bibasal chronic sounding coarse Velcro-like crackles. No wheezing or rhonchi. No increased work of breathing. Able to speak in full sentences.  Cardiovascular system: S1 and S2 heard, RRR. No JVD, murmurs, gallops, clicks or pedal edema.  Gastrointestinal system: Abdomen is nondistended, soft and nontender. Normal bowel sounds heard. No organomegaly or masses appreciated.  Central nervous system: Alert and oriented. No focal neurological deficits.  Extremities: Symmetric 5 x 5 power. Peripheral pulses symmetrically felt. Extensive chronic bruising of left forearm.  Skin: No rashes or acute findings.  Musculoskeletal system: Negative exam.  Psychiatry: Pleasant and cooperative.   Labs on Admission:  Basic Metabolic Panel:  Recent Labs Lab 04/18/15 1225  NA 138  K  4.2  CL 109  CO2 23  GLUCOSE 100*  BUN 36*  CREATININE 2.18*  CALCIUM 8.4*   Liver Function Tests:  Recent Labs Lab 04/18/15 1225  AST 15  ALT 10*  ALKPHOS 82  BILITOT 0.9  PROT 5.8*  ALBUMIN 3.1*   No results for input(s): LIPASE, AMYLASE in the last 168 hours. No results for input(s): AMMONIA in  the last 168 hours. CBC:  Recent Labs Lab 04/18/15 1225  WBC 8.7  HGB 9.6*  HCT 29.2*  MCV 111.0*  PLT 176   Cardiac Enzymes: No results for input(s): CKTOTAL, CKMB, CKMBINDEX, TROPONINI in the last 168 hours.  BNP (last 3 results) No results for input(s): PROBNP in the last 8760 hours. CBG: No results for input(s): GLUCAP in the last 168 hours.  Radiological Exams on Admission: Dg Chest 2 View  04/18/2015   CLINICAL DATA:  Fever last night  EXAM: CHEST  2 VIEW  COMPARISON:  None.  FINDINGS: There are fibrotic changes throughout the lungs. New patchy airspace opacities have developed at the right lung base. Hiatal hernia is noted. Upper normal heart size.  IMPRESSION: New right basilar airspace disease. Followup PA and lateral chest X-ray is recommended in 3-4 weeks following trial of antibiotic therapy to ensure resolution and exclude underlying malignancy.   Electronically Signed   By: Marybelle Killings M.D.   On: 04/18/2015 13:40    EKG: Independently reviewed. Sinus rhythm, normal axis, Q waves in leads V1-2 in no acute changes. QTC 426 ms.  Assessment/Plan Principal Problem:   CAP (community acquired pneumonia) Active Problems:   Hypothyroidism   Hyperlipidemia   BRONCHIECTASIS with mod severe airflow obst   GERD   CAD- severe 4 V CAD- not CABG candidate   Wegener's granulomatosis with renal involvement   Chronic kidney disease, stage IV (severe)   Right lower lobe community-acquired pneumonia, complicating underlying bronchiectasis - Blood and sputum cultures - Treat empirically with broad-spectrum IV antibiotics including vancomycin and Fortaz. - Aggressive  pulmonary toilet. - will need follow-up chest x-ray in 3-4 weeks to ensure resolution of pneumonia findings.  Bronchiectasis  - No features suggestive of bronchospasm at this time. - Pulmonary toilet.  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 Will hold cyclophosphamide due to acute infection. - No hemoptysis reported  Stage IV chronic kidney disease - Current creatinine may be close to baseline. Continue brief and gentle IV fluids and follow BMP in a.m. - Continue home dose of sodium bicarbonate  Essential hypertension - Controlled  Hypothyroid - Continue Synthroid  Hyperlipidemia - Continue statins  BPH - Continue Flomax and Proscar  CAD - Not candidate for CABG. Has severe 4 vessel disease - Asymptomatic of chest pain - Continue beta blockers, aspirin, Plavix and statins  GERD/history of erosive gastritis-reactive gastropathy and esophageal stricture - Continue H2 blockers  Chronic macrocytic anemia - Recent anemia panel: Folate >24.8, B12: 537, iron 53, TIBC 210, saturation ratio 25 and ferritin 406 - Hemoglobin on admission 9.6-better than on 8/13 when it was 8.3. Some of this may be dilutional and expect slight drop post hydration. Follow CBCs - Goes to [facility] every 2 weeks for anemia short-? Aranesp - Anemia is likely multifactorial from chronic disease, chronic kidney disease. - No report of overt bleeding. Apparently has had EGD and colonoscopy within the last 3 years.  Chronic systolic CHF/Ischemic cardiomyopathy - Clinically dehydrated. Brief and gentle IV fluids while closely monitoring.  Right femoral pseudoaneurysm      DVT prophylaxis: Lovenox Code Status: Full  Family Communication: Discussed extensively with patient's spouse at bedside  Disposition Plan: DC home when medically stable, possibly in 3-4 days   Time spent: 30 minutes  Martina Brodbeck, MD, FACP, FHM. [facility] Pager [phone]  If 7PM-7AM, please contact night-coverage [email or web address] Password [email or web address] 04/18/2015, 4:25 PM

## 2015-04-18 NOTE — ED Notes (Signed)
Wife reports fever of 104 last night, almost 101 this am. Afebrile at present. Weakness and SHOB. Poor historians

## 2015-04-18 NOTE — ED Notes (Signed)
RN at bedside starting IV 

## 2015-04-19 DIAGNOSIS — J479 Bronchiectasis, uncomplicated: Secondary | ICD-10-CM

## 2015-04-19 LAB — BASIC METABOLIC PANEL
Anion gap: 6 (ref 5–15)
BUN: 32 mg/dL — AB (ref 6–20)
CHLORIDE: 113 mmol/L — AB (ref 101–111)
CO2: 24 mmol/L (ref 22–32)
CREATININE: 2.18 mg/dL — AB (ref 0.61–1.24)
Calcium: 8.4 mg/dL — ABNORMAL LOW (ref 8.9–10.3)
GFR calc Af Amer: 32 mL/min — ABNORMAL LOW (ref >60)
GFR calc non Af Amer: 28 mL/min — ABNORMAL LOW (ref >60)
Glucose, Bld: 114 mg/dL — ABNORMAL HIGH (ref 65–99)
POTASSIUM: 4.7 mmol/L (ref 3.5–5.1)
SODIUM: 143 mmol/L (ref 135–145)

## 2015-04-19 LAB — CBC
HEMATOCRIT: 26.2 % — AB (ref 39.0–52.0)
Hemoglobin: 8.7 g/dL — ABNORMAL LOW (ref 13.0–17.0)
MCH: 37 pg — AB (ref 26.0–34.0)
MCHC: 33.2 g/dL (ref 30.0–36.0)
MCV: 111.5 fL — AB (ref 78.0–100.0)
Platelets: 150 10*3/uL (ref 150–400)
RBC: 2.35 MIL/uL — AB (ref 4.22–5.81)
RDW: 15.1 % (ref 11.5–15.5)
WBC: 5.3 10*3/uL (ref 4.0–10.5)

## 2015-04-19 MED ORDER — IPRATROPIUM-ALBUTEROL 0.5-2.5 (3) MG/3ML IN SOLN
3.0000 mL | Freq: Two times a day (BID) | RESPIRATORY_TRACT | Status: DC
  Administered 2015-04-19 – 2015-04-21 (×4): 3 mL via RESPIRATORY_TRACT
  Filled 2015-04-19 (×4): qty 3

## 2015-04-19 MED ORDER — SODIUM CHLORIDE 0.9 % IV SOLN
INTRAVENOUS | Status: DC
  Administered 2015-04-19: 22:00:00 via INTRAVENOUS

## 2015-04-19 NOTE — Progress Notes (Signed)
PROGRESS NOTE    FARRELL PANTALEO LFY:[id number] DOB: 04-09-1936 DOA: 04/18/2015 PCP: [name], MD  Outpatient Specialists:  1. Pulmonology: Dr. Christinia Gully 2. Cardiology: Dr. Lyman Bishop 3. Nephrology: Dr. Jeneen Rinks Deterding.  HPI/Brief narrative 79 y.o. male with history of bronchiectasis, Wegener's granulomatosis with renal involvement-on immunosuppressants and chronic steroids, GERD, BPH, chronic anemia, HTN, CAD, presented to the [facility] ED on 04/18/15 with complaints of high fevers, worsening productive cough and dyspnea. In the ED, transiently hypotensive in the 90s, temperature 98.21F, wheezing, lab work significant for creatinine of 2.18, hemoglobin 9.6, MCV 111 and chest x-ray showed new right basilar airspace disease suggestive of pneumonia. Hospitalist admission was requested for management of pneumonia.    Assessment/Plan:  Right lower lobe community-acquired pneumonia, complicating underlying bronchiectasis - Blood and sputum cultures-pending - Treating empirically with broad-spectrum IV antibiotics including vancomycin and Fortaz. - Aggressive pulmonary toilet. - will need follow-up chest x-ray in 3-4 weeks to ensure resolution of pneumonia findings. - Improving clinically  Bronchiectasis  - No features suggestive of bronchospasm at this time. - Pulmonary toilet.  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 Holding cyclophosphamide due to acute infection. - No hemoptysis reported  Stage IV chronic kidney disease - Current creatinine may be close to baseline. Creatinine stable. - Continue home dose of sodium bicarbonate  Essential hypertension - Controlled  Hypothyroid - Continue Synthroid  Hyperlipidemia - Continue statins  BPH - Continue Flomax and Proscar  CAD - Not candidate for CABG. Has severe 4 vessel disease - Asymptomatic of chest pain - Continue beta blockers,  aspirin, Plavix and statins  GERD/history of erosive gastritis-reactive gastropathy and esophageal stricture - Continue H2 blockers  Chronic macrocytic anemia - Recent anemia panel: Folate >24.8, B12: 537, iron 53, TIBC 210, saturation ratio 25 and ferritin 406 - Hemoglobin on admission 9.6-better than on 8/13 when it was 8.3. Some of this may be dilutional and expect slight drop post hydration. Follow CBCs - Goes to [facility] every 2 weeks for anemia short-? Aranesp - Anemia is likely multifactorial from chronic disease, chronic kidney disease. - No report of overt bleeding. Apparently has had EGD and colonoscopy within the last 3 years. - Hemoglobin has dropped from 9.6 on admission to 8.7 which may be dilutional. Follow CBC in a.m. Transfuse if hemoglobin less than 77 g per DL.  Chronic systolic CHF/Ischemic cardiomyopathy - Compensated  Right femoral pseudoaneurysm     DVT prophylaxis: Lovenox Code Status: Full  Family Communication: Discussed extensively with patient's spouse at bedside  Disposition Plan: DC home when medically stable, possibly in 1-2 days    Consultants:  None  Procedures:  None  Antibiotics:  IV vancomycin 9/10 >  IV Fortaz 9/10 >   Subjective: Overall feels much better. Appetite improving. No dyspnea. Cough is less. Slept well last night.  Objective: Filed Vitals:   04/18/15 2140 04/19/15 0555 04/19/15 0833 04/19/15 1045  BP: 80/65 107/59 106/55   Pulse:  78 90   Temp:  98.1 F (36.7 C)    TempSrc:  Oral    Resp:  18    Height:      Weight:  50.803 kg (112 lb)    SpO2:  96%  96%    Intake/Output Summary (Last 24 hours) at 04/19/15 1154 Last data filed at 04/19/15 0833  Gross per 24 hour  Intake    350 ml  Output      0  ml  Net    350 ml   Filed Weights   04/18/15 1600 04/19/15 0555  Weight: 52.164 kg (115 lb) 50.803 kg (112 lb)     Exam:  General exam: Pleasant elderly male sitting up comfortably in bed. Respiratory  system: Reduced breath sounds in both bases, right >left with coarse chronic sounding Velcro-like crackles in the bases. No wheezing or rhonchi. No increased work of breathing. Cardiovascular system: S1 & S2 heard, RRR. No JVD, murmurs, gallops, clicks or pedal edema. Telemetry: Sinus rhythm. Gastrointestinal system: Abdomen is nondistended, soft and nontender. Normal bowel sounds heard. Central nervous system: Alert and oriented. No focal neurological deficits. Extremities: Symmetric 5 x 5 power.   Data Reviewed: Basic Metabolic Panel:  Recent Labs Lab 04/18/15 1225 04/19/15 0527  NA 138 143  K 4.2 4.7  CL 109 113*  CO2 23 24  GLUCOSE 100* 114*  BUN 36* 32*  CREATININE 2.18* 2.18*  CALCIUM 8.4* 8.4*   Liver Function Tests:  Recent Labs Lab 04/18/15 1225  AST 15  ALT 10*  ALKPHOS 82  BILITOT 0.9  PROT 5.8*  ALBUMIN 3.1*   No results for input(s): LIPASE, AMYLASE in the last 168 hours. No results for input(s): AMMONIA in the last 168 hours. CBC:  Recent Labs Lab 04/18/15 1225 04/19/15 0527  WBC 8.7 5.3  HGB 9.6* 8.7*  HCT 29.2* 26.2*  MCV 111.0* 111.5*  PLT 176 150   Cardiac Enzymes: No results for input(s): CKTOTAL, CKMB, CKMBINDEX, TROPONINI in the last 168 hours. BNP (last 3 results) No results for input(s): PROBNP in the last 8760 hours. CBG: No results for input(s): GLUCAP in the last 168 hours.  Recent Results (from the past 240 hour(s))  Culture, sputum-assessment     Status: None   Collection Time: 04/18/15  7:20 PM  Result Value Ref Range Status   Specimen Description SPUTUM  Final   Special Requests NONE  Final   Sputum evaluation   Final    THIS SPECIMEN IS ACCEPTABLE. RESPIRATORY CULTURE REPORT TO FOLLOW.   Report Status 04/18/2015 FINAL  Final         Studies: Dg Chest 2 View  04/18/2015   CLINICAL DATA:  Fever last night  EXAM: CHEST  2 VIEW  COMPARISON:  None.  FINDINGS: There are fibrotic changes throughout the lungs. New patchy  airspace opacities have developed at the right lung base. Hiatal hernia is noted. Upper normal heart size.  IMPRESSION: New right basilar airspace disease. Followup PA and lateral chest X-ray is recommended in 3-4 weeks following trial of antibiotic therapy to ensure resolution and exclude underlying malignancy.   Electronically Signed   By: Marybelle Killings M.D.   On: 04/18/2015 13:40        Scheduled Meds: . aspirin EC  81 mg Oral Daily  . bisacodyl  5 mg Oral BID  . budesonide-formoterol  2 puff Inhalation BID  . calcium citrate-vitamin D  1 tablet Oral Daily  . cefTAZidime (FORTAZ)  IV  1 g Intravenous Q24H  . clopidogrel  75 mg Oral Q breakfast  . enoxaparin (LOVENOX) injection  30 mg Subcutaneous Q24H  . famotidine  20 mg Oral BID  . finasteride  5 mg Oral Daily  . guaiFENesin  600 mg Oral BID  . Influenza vac split quadrivalent PF  0.5 mL Intramuscular Tomorrow-1000  . ipratropium-albuterol  3 mL Nebulization TID  . loratadine  10 mg Oral Daily  . metoprolol succinate  25  mg Oral Daily  . prednisoLONE  7.5 mg Oral Q breakfast  . psyllium  1 packet Oral Daily  . simvastatin  20 mg Oral q1800  . sodium bicarbonate  1,300 mg Oral BID  . sodium chloride  3 mL Intravenous Q12H  . tamsulosin  0.4 mg Oral QPM  . vancomycin  500 mg Intravenous Q24H   Continuous Infusions:    Principal Problem:   CAP (community acquired pneumonia) Active Problems:   Hypothyroidism   Hyperlipidemia   BRONCHIECTASIS with mod severe airflow obst   GERD   CAD- severe 4 V CAD- not CABG candidate   Wegener's granulomatosis with renal involvement   Chronic kidney disease, stage IV (severe)    Time spent: 30 minutes.    Vernell Leep, MD, FACP, FHM. [facility] Pager [phone]  If 7PM-7AM, please contact night-coverage [email or web address] Password [email or web address] 04/19/2015, 11:54 AM    LOS: 1 day

## 2015-04-20 DIAGNOSIS — J189 Pneumonia, unspecified organism: Secondary | ICD-10-CM | POA: Diagnosis not present

## 2015-04-20 LAB — BASIC METABOLIC PANEL
Anion gap: 7 (ref 5–15)
BUN: 34 mg/dL — AB (ref 6–20)
CALCIUM: 8.3 mg/dL — AB (ref 8.9–10.3)
CHLORIDE: 113 mmol/L — AB (ref 101–111)
CO2: 23 mmol/L (ref 22–32)
CREATININE: 2.2 mg/dL — AB (ref 0.61–1.24)
GFR calc Af Amer: 32 mL/min — ABNORMAL LOW (ref >60)
GFR calc non Af Amer: 27 mL/min — ABNORMAL LOW (ref >60)
Glucose, Bld: 110 mg/dL — ABNORMAL HIGH (ref 65–99)
Potassium: 4 mmol/L (ref 3.5–5.1)
SODIUM: 143 mmol/L (ref 135–145)

## 2015-04-20 LAB — CBC
HCT: 24.6 % — ABNORMAL LOW (ref 39.0–52.0)
HEMOGLOBIN: 8.1 g/dL — AB (ref 13.0–17.0)
MCH: 36.8 pg — AB (ref 26.0–34.0)
MCHC: 32.9 g/dL (ref 30.0–36.0)
MCV: 111.8 fL — ABNORMAL HIGH (ref 78.0–100.0)
PLATELETS: 151 10*3/uL (ref 150–400)
RBC: 2.2 MIL/uL — ABNORMAL LOW (ref 4.22–5.81)
RDW: 14.7 % (ref 11.5–15.5)
WBC: 4.9 10*3/uL (ref 4.0–10.5)

## 2015-04-20 MED ORDER — ENSURE ENLIVE PO LIQD
237.0000 mL | Freq: Two times a day (BID) | ORAL | Status: DC
  Administered 2015-04-20 – 2015-04-21 (×2): 237 mL via ORAL

## 2015-04-20 NOTE — Progress Notes (Signed)
PROGRESS NOTE    MARSH HECKLER QPR:[id number] DOB: [birth year] DOA: 04/18/2015 PCP: [name], MD  Outpatient Specialists:  1. Pulmonology: Dr. Christinia Gully 2. Cardiology: Dr. Lyman Bishop 3. Nephrology: Dr. Jeneen Rinks Deterding.  HPI/Brief narrative 79 y.o. male with history of bronchiectasis, Wegener's granulomatosis with renal involvement-on immunosuppressants and chronic steroids, GERD, BPH, chronic anemia, HTN, CAD, presented to the [facility] ED on 04/18/15 with complaints of high fevers, worsening productive cough and dyspnea. In the ED, transiently hypotensive in the 90s, temperature 98.62F, wheezing, lab work significant for creatinine of 2.18, hemoglobin 9.6, MCV 111 and chest x-ray showed new right basilar airspace disease suggestive of pneumonia. Hospitalist admission was requested for management of pneumonia.    Assessment/Plan:  Right lower lobe community-acquired pneumonia, complicating underlying bronchiectasis - Blood and sputum cultures-negative to date. Sputum culture: Normal OP flora-not helpful. - Treating empirically with broad-spectrum IV antibiotics including vancomycin and Fortaz. - Aggressive pulmonary toilet. - will need follow-up chest x-ray in 3-4 weeks to ensure resolution of pneumonia findings. - Improving clinically. Continue additional 24 hours of IV antibiotics and then will plan to discharge 9/13 on oral antibiotics.  Bronchiectasis  - No features suggestive of bronchospasm at this time. - Pulmonary toilet.  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 Holding cyclophosphamide due to acute infection. - No hemoptysis reported  Stage IV chronic kidney disease - Current creatinine may be close to baseline. Creatinine stable. - Continue home dose of sodium bicarbonate  Essential hypertension - Controlled  Hypothyroid - Continue Synthroid  Hyperlipidemia - Continue  statins  BPH - Continue Flomax and Proscar  CAD - Not candidate for CABG. Has severe 4 vessel disease - Asymptomatic of chest pain - Continue beta blockers, aspirin, Plavix and statins  GERD/history of erosive gastritis-reactive gastropathy and esophageal stricture - Continue H2 blockers  Chronic macrocytic anemia - Recent anemia panel: Folate >24.8, B12: 537, iron 53, TIBC 210, saturation ratio 25 and ferritin 406 - Hemoglobin on admission 9.6-better than on 8/13 when it was 8.3. Some of this may be dilutional and expect slight drop post hydration. Follow CBCs - Goes to [facility] every 2 weeks for anemia short-? Aranesp - Anemia is likely multifactorial from chronic disease, chronic kidney disease. - No report of overt bleeding. Apparently has had EGD and colonoscopy within the last 3 years. - Hemoglobin has dropped from 9.6 on admission to 8.7 which may be dilutional. Follow CBC in a.m. Transfuse if hemoglobin less than 7 g per DL.  Chronic systolic CHF/Ischemic cardiomyopathy - Compensated  Right femoral pseudoaneurysm     DVT prophylaxis: Lovenox Code Status: Full  Family Communication: Discussed extensively with patient's spouse at bedside  Disposition Plan: DC home when medically stable, possibly  9/13   Consultants:  None  Procedures:  None  Antibiotics:  IV vancomycin 9/10 >  IV Fortaz 9/10 >   Subjective: Does not feel as well as he did yesterday. States that he did not sleep well last night due to disturbance from IV fluid beeping, dry cough. Poor appetite for breakfast this morning.  Objective: Filed Vitals:   04/19/15 2227 04/19/15 2229 04/20/15 0533 04/20/15 0931  BP:   103/57   Pulse:   91 100  Temp:   98.5 F (36.9 C)   TempSrc:   Oral   Resp:   16 17  Height:      Weight:      SpO2: 97% 97% 95%  97%    Intake/Output Summary (Last 24 hours) at 04/20/15 1226 Last data filed at 04/20/15 1121  Gross per 24 hour  Intake   1805 ml  Output     550 ml  Net   1255 ml   Filed Weights   04/18/15 1600 04/19/15 0555  Weight: 52.164 kg (115 lb) 50.803 kg (112 lb)     Exam:  General exam: Pleasant elderly male sitting up comfortably in bed. Respiratory system: Reduced breath sounds in both bases, right >left with coarse chronic sounding Velcro-like crackles in the bases. No wheezing or rhonchi. No increased work of breathing. Cardiovascular system: S1 & S2 heard, RRR. No JVD, murmurs, gallops, clicks or pedal edema.  Gastrointestinal system: Abdomen is nondistended, soft and nontender. Normal bowel sounds heard. Central nervous system: Alert and oriented. No focal neurological deficits. Extremities: Symmetric 5 x 5 power.   Data Reviewed: Basic Metabolic Panel:  Recent Labs Lab 04/18/15 1225 04/19/15 0527 04/20/15 0439  NA 138 143 143  K 4.2 4.7 4.0  CL 109 113* 113*  CO2 23 24 23   GLUCOSE 100* 114* 110*  BUN 36* 32* 34*  CREATININE 2.18* 2.18* 2.20*  CALCIUM 8.4* 8.4* 8.3*   Liver Function Tests:  Recent Labs Lab 04/18/15 1225  AST 15  ALT 10*  ALKPHOS 82  BILITOT 0.9  PROT 5.8*  ALBUMIN 3.1*   No results for input(s): LIPASE, AMYLASE in the last 168 hours. No results for input(s): AMMONIA in the last 168 hours. CBC:  Recent Labs Lab 04/18/15 1225 04/19/15 0527 04/20/15 0439  WBC 8.7 5.3 4.9  HGB 9.6* 8.7* 8.1*  HCT 29.2* 26.2* 24.6*  MCV 111.0* 111.5* 111.8*  PLT 176 150 151   Cardiac Enzymes: No results for input(s): CKTOTAL, CKMB, CKMBINDEX, TROPONINI in the last 168 hours. BNP (last 3 results) No results for input(s): PROBNP in the last 8760 hours. CBG: No results for input(s): GLUCAP in the last 168 hours.  Recent Results (from the past 240 hour(s))  Culture, blood (routine x 2)     Status: None (Preliminary result)   Collection Time: 04/18/15  4:49 PM  Result Value Ref Range Status   Specimen Description BLOOD LEFT ARM  Final   Special Requests   Final    BOTTLES DRAWN AEROBIC AND  ANAEROBIC 10CC BOTH BOTTLES   Culture   Final    NO GROWTH < 24 HOURS Performed at [facility]    Report Status PENDING  Incomplete  Culture, blood (routine x 2)     Status: None (Preliminary result)   Collection Time: 04/18/15  4:55 PM  Result Value Ref Range Status   Specimen Description BLOOD LEFT ARM  Final   Special Requests   Final    BOTTLES DRAWN AEROBIC AND ANAEROBIC 10CC BOTH BOTTLES   Culture   Final    NO GROWTH < 24 HOURS Performed at [facility]    Report Status PENDING  Incomplete  Culture, sputum-assessment     Status: None   Collection Time: 04/18/15  7:20 PM  Result Value Ref Range Status   Specimen Description SPUTUM  Final   Special Requests NONE  Final   Sputum evaluation   Final    THIS SPECIMEN IS ACCEPTABLE. RESPIRATORY CULTURE REPORT TO FOLLOW.   Report Status 04/18/2015 FINAL  Final  Culture, respiratory (NON-Expectorated)     Status: None (Preliminary result)   Collection Time: 04/18/15  7:20 PM  Result Value Ref Range  Status   Specimen Description SPU  Final   Special Requests NONE  Final   Gram Stain PENDING  Incomplete   Culture   Final    NORMAL OROPHARYNGEAL FLORA Performed at [facility]    Report Status PENDING  Incomplete         Studies: Dg Chest 2 View  04/18/2015   CLINICAL DATA:  Fever last night  EXAM: CHEST  2 VIEW  COMPARISON:  None.  FINDINGS: There are fibrotic changes throughout the lungs. New patchy airspace opacities have developed at the right lung base. Hiatal hernia is noted. Upper normal heart size.  IMPRESSION: New right basilar airspace disease. Followup PA and lateral chest X-ray is recommended in 3-4 weeks following trial of antibiotic therapy to ensure resolution and exclude underlying malignancy.   Electronically Signed   By: Marybelle Killings M.D.   On: 04/18/2015 13:40        Scheduled Meds: . aspirin EC  81 mg Oral Daily  . bisacodyl  5 mg Oral BID  . budesonide-formoterol  2 puff  Inhalation BID  . calcium citrate-vitamin D  1 tablet Oral Daily  . cefTAZidime (FORTAZ)  IV  1 g Intravenous Q24H  . clopidogrel  75 mg Oral Q breakfast  . enoxaparin (LOVENOX) injection  30 mg Subcutaneous Q24H  . famotidine  20 mg Oral BID  . feeding supplement (ENSURE ENLIVE)  237 mL Oral BID BM  . finasteride  5 mg Oral Daily  . guaiFENesin  600 mg Oral BID  . ipratropium-albuterol  3 mL Nebulization BID  . loratadine  10 mg Oral Daily  . metoprolol succinate  25 mg Oral Daily  . prednisoLONE  7.5 mg Oral Q breakfast  . psyllium  1 packet Oral Daily  . simvastatin  20 mg Oral q1800  . sodium bicarbonate  1,300 mg Oral BID  . sodium chloride  3 mL Intravenous Q12H  . tamsulosin  0.4 mg Oral QPM  . vancomycin  500 mg Intravenous Q24H   Continuous Infusions:    Principal Problem:   CAP (community acquired pneumonia) Active Problems:   Hypothyroidism   Hyperlipidemia   BRONCHIECTASIS with mod severe airflow obst   GERD   CAD- severe 4 V CAD- not CABG candidate   Wegener's granulomatosis with renal involvement   Chronic kidney disease, stage IV (severe)    Time spent: 20 minutes.    Vernell Leep, MD, FACP, FHM. [facility] Pager [phone]  If 7PM-7AM, please contact night-coverage [email or web address] Password [email or web address] 04/20/2015, 12:26 PM    LOS: 2 days

## 2015-04-20 NOTE — Progress Notes (Signed)
Initial Nutrition Assessment  DOCUMENTATION CODES:   Severe malnutrition in context of chronic illness  INTERVENTION:  - Will order Ensure Enlive BID, each supplement provides 350 kcal and 20 grams of protein - Encourage PO intakes at meals and of supplements - RD will continue to monitor for needs  NUTRITION DIAGNOSIS:   Malnutrition related to chronic illness as evidenced by severe depletion of muscle mass, severe depletion of body fat.  GOAL:   Patient will meet greater than or equal to 90% of their needs  MONITOR:   PO intake, Supplement acceptance, Weight trends, Labs, I & O's  REASON FOR ASSESSMENT:   Malnutrition Screening Tool  ASSESSMENT:   79 y.o. male with history of bronchiectasis, Wegener's granulomatosis with renal involvement-on immunosuppressants and chronic steroids, GERD, BPH, chronic anemia, HTN, CAD, presented to the [facility] ED on 04/18/15 with complaints of high fevers, worsening productive cough and dyspnea. Patient and spouse at bedside provided history. He gives several months history of generalized weakness attributed to anemia which has progressively gotten worse in the last 2 weeks. He chronically has some cough productive of yellow sputum. Since yesterday, he has noted worsening cough with increased sputum production, high fevers up to 104F, worsening dyspnea especially on minimal exertion, generalized weakness and decreased appetite.   Pt seen for MST. BMI indicates normal weight status, borderline underweight. Pt ate 50% dinner 9/10 and 100% of all meals yesterday. He states that he had cereal and a bagel and coffee for breakfast this AM and that he has ordered lunch.   PTA pt had a fair appetite and that this has continued since admission. He was losing weight due to this but is unsure of amount of weight lost. He contributes poor appetite and associated weight loss to medication.  He was drinking Ensure once/day PTA and is interested in  Ensure being ordered here. Pt asks about ways to gain weight and this was discussed. Also encouraged Ensure BID if meals are small or unable to eat a meal. Also talked with pt about using Ensure to make a milkshake or smoothie to add variety.   Pt states that outpatient renal doctor is supportive of him eating anything he wants due to need to gain weight but that outpatient cardiologist is very strict about recommendations. Encouraged pt to focus on the support that is being provided by renal doctor.  Likely meeting needs since admission. Severe muscle and fat wasting noted. Per chart review, pt has lost 4 lbs (3% body weight) in the past 1 month which is not significant for time frame.  Medications reviewed. Labs reviewed; Cl: 113 mmol/L, BUN/creatinine elevated and trending up, Ca: 8.3 mg/dL, GFR: 27.   Diet Order:  Diet renal with fluid restriction Fluid restriction:: 1200 mL Fluid; Room service appropriate?: Yes; Fluid consistency:: Thin  Skin:  Reviewed, no issues  Last BM:  9/12  Height:   Ht Readings from Last 1 Encounters:  04/18/15 5\' 6"  (1.676 m)    Weight:   Wt Readings from Last 1 Encounters:  04/19/15 112 lb (50.803 kg)    Ideal Body Weight:  64.54 kg (kg)  BMI:  Body mass index is 18.09 kg/(m^2).  Estimated Nutritional Needs:   Kcal:  1400-1600  Protein:  50-60 grams  Fluid:  2 L/day  EDUCATION NEEDS:   No education needs identified at this time     Jarome Matin, RD, LDN Inpatient Clinical Dietitian Pager # [phone] After hours/weekend pager # [phone]

## 2015-04-20 NOTE — Care Management Important Message (Signed)
Important Message  Patient Details  Name: Richard [name] MRN: [id number] Date of Birth: 1936/01/22   Medicare Important Message Given:  Yes-second notification given    Camillo Flaming 04/20/2015, 12:29 [place] Message  Patient Details  Name: Richard [name] MRN: [id number] Date of Birth: 1935-09-08   Medicare Important Message Given:  Yes-second notification given    Camillo Flaming 04/20/2015, 12:29 PM

## 2015-04-21 ENCOUNTER — Encounter (HOSPITAL_COMMUNITY): Payer: Medicare Other

## 2015-04-21 DIAGNOSIS — D631 Anemia in chronic kidney disease: Secondary | ICD-10-CM

## 2015-04-21 DIAGNOSIS — E43 Unspecified severe protein-calorie malnutrition: Secondary | ICD-10-CM | POA: Insufficient documentation

## 2015-04-21 DIAGNOSIS — N189 Chronic kidney disease, unspecified: Secondary | ICD-10-CM

## 2015-04-21 LAB — CULTURE, RESPIRATORY: CULTURE: NORMAL

## 2015-04-21 LAB — CBC
HCT: 24.5 % — ABNORMAL LOW (ref 39.0–52.0)
HEMOGLOBIN: 8 g/dL — AB (ref 13.0–17.0)
MCH: 36 pg — ABNORMAL HIGH (ref 26.0–34.0)
MCHC: 32.7 g/dL (ref 30.0–36.0)
MCV: 110.4 fL — ABNORMAL HIGH (ref 78.0–100.0)
Platelets: 165 10*3/uL (ref 150–400)
RBC: 2.22 MIL/uL — ABNORMAL LOW (ref 4.22–5.81)
RDW: 14.4 % (ref 11.5–15.5)
WBC: 4.5 10*3/uL (ref 4.0–10.5)

## 2015-04-21 MED ORDER — TRAMADOL HCL 50 MG PO TABS: 50.0000 mg | ORAL_TABLET | Freq: Every day | ORAL | Status: DC | PRN

## 2015-04-21 MED ORDER — LEVOFLOXACIN 750 MG PO TABS
750.0000 mg | ORAL_TABLET | ORAL | Status: DC
  Administered 2015-04-21: 750 mg via ORAL
  Filled 2015-04-21: qty 1

## 2015-04-21 MED ORDER — LEVOFLOXACIN 750 MG PO TABS: ORAL_TABLET | ORAL | Status: DC

## 2015-04-21 MED ORDER — EPOETIN ALFA 40000 UNIT/ML IJ SOLN
30000.0000 [IU] | Freq: Once | INTRAMUSCULAR | Status: AC
  Administered 2015-04-21: 30000 [IU] via SUBCUTANEOUS
  Filled 2015-04-21: qty 1

## 2015-04-21 MED ORDER — CYCLOPHOSPHAMIDE 50 MG PO CAPS: 50.0000 mg | ORAL_CAPSULE | Freq: Every day | ORAL | Status: DC

## 2015-04-21 NOTE — Progress Notes (Signed)
Completed D/C teaching with patient and family. Answered all questions. Patient will be D/C home with family.

## 2015-04-21 NOTE — Discharge Summary (Signed)
Physician Discharge Summary  Richard [name]:[id number] DOB: [birth year] DOA: 04/18/2015  PCP: [name], MD  Outpatient Specialists:   Pulmonology: Dr. Christinia Gully  Cardiology: Dr. Lyman Bishop  Nephrology: Dr. Jeneen Rinks Deter[place].  Admit date: 04/18/2015 Discharge date: 04/21/2015  Time spent: Greater than 30 minutes  Recommendations for Outpatient Follow-up:  1. Dr. Alysia Penna, PCP in 3-5 days with repeat labs (CBC & BMP). Please follow final blood culture results that were sent from the hospital. 2. Recommend follow-up chest x-ray in 3-4 weeks 3. Dr. Christinia Gully, Pulmonology 4. Dr. Mauricia Area, Nephrology  Discharge Diagnoses:  Principal Problem:   CAP (community acquired pneumonia) Active Problems:   Hypothyroidism   Hyperlipidemia   BRONCHIECTASIS with mod severe airflow obst   GERD   CAD- severe 4 V CAD- not CABG candidate   Wegener's granulomatosis with renal involvement   Chronic kidney disease, stage IV (severe)   Protein-calorie malnutrition, severe   Discharge Condition: Improved & Stable  Diet recommendation: Heart healthy diet.  Filed Weights   04/18/15 1600 04/19/15 0555 04/21/15 0534  Weight: 52.164 kg (115 lb) 50.803 kg (112 lb) 51.302 kg (113 lb 1.6 oz)    History of present illness:  79 y.o. male with history of bronchiectasis, Wegener's granulomatosis with renal involvement-on immunosuppressants and chronic steroids, GERD, BPH, chronic anemia, HTN, CAD, presented to the [facility] ED on 04/18/15 with complaints of high fevers, worsening productive cough and dyspnea. In the ED, transiently hypotensive in the 90s, temperature 98.43F, wheezing, lab work significant for creatinine of 2.18, hemoglobin 9.6, MCV 111 and chest x-ray showed new right basilar airspace disease suggestive of pneumonia. Hospitalist admission was requested for management of pneumonia.   Hospital Course:   Right lower lobe community-acquired pneumonia,  complicating underlying bronchiectasis - Blood cultures-negative to date. Sputum culture: Normal OP flora-not helpful. - Treating empirically with broad-spectrum IV antibiotics including vancomycin and Fortaz. - Aggressive pulmonary toilet. - will need follow-up chest x-ray in 3-4 weeks to ensure resolution of pneumonia findings. - Improving clinically.  - DC IV Fortaz and vancomycin. We will transition to oral levofloxacin and completed total 7 days treatment (discussed with infectious disease M.D. on call. Levofloxacin dose adjusted to patient's renal function as discussed with pharmacy)  Bronchiectasis  - No features suggestive of bronchospasm at this time. - Pulmonary toilet.  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 Holding cyclophosphamide due to acute infection-patient to follow-up with PCP or pulmonology regarding timing of resumption. - No hemoptysis reported  Stage IV chronic kidney disease - Current creatinine may be close to baseline. Creatinine stable. - Continue home dose of sodium bicarbonate - Discussed with primary nephrologist: Okay to give a dose of Procrit prior to DC  Essential hypertension - Controlled  Hypothyroid - Continue Synthroid  Hyperlipidemia - Continue statins  BPH - Continue Flomax and Proscar  CAD - Not candidate for CABG. Has severe 4 vessel disease - Asymptomatic of chest pain - Continue beta blockers, aspirin, Plavix and statins  GERD/history of erosive gastritis-reactive gastropathy and esophageal stricture - Continue H2 blockers  Chronic macrocytic anemia - Recent anemia panel: Folate >24.8, B12: 537, iron 53, TIBC 210, saturation ratio 25 and ferritin 406 - Hemoglobin on admission 9.6-better than on 8/13 when it was 8.3. Some of this may be dilutional and expect slight drop post hydration. Follow CBCs - Goes to [facility] every 2 weeks for anemia short-? Aranesp - Anemia is  likely multifactorial from chronic disease, chronic kidney disease. - No report of overt bleeding. Apparently has had EGD and colonoscopy within the last 3 years. - Hemoglobin has dropped from 9.6 on admission to 8.7 which may be dilutional. Follow CBC in a.m. Transfuse if hemoglobin less than 7 g per DL. - Discussed with primary nephrologist: Okay to give a dose of Procrit prior to DC.  Chronic systolic CHF/Ischemic cardiomyopathy - Compensated  Right femoral pseudoaneurysm    Consultants:  None  Procedures:  None    Discharge Exam:  Complaints:  Continues to feel better. No dyspnea or chest pain. No fevers. Seen ambulating comfortably in the halls.  Filed Vitals:   04/20/15 2206 04/21/15 0534 04/21/15 0851 04/21/15 1323  BP: 119/62 116/54  96/58  Pulse: 109 93 112 96  Temp: 98.5 F (36.9 C) 99.4 F (37.4 C)  97.8 F (36.6 C)  TempSrc: Oral Oral  Oral  Resp: 18 18 18 18   Height:      Weight:  51.302 kg (113 lb 1.6 oz)    SpO2: 98% 96% 93% 98%    General exam: Pleasant elderly male seen ambulating comfortably in the halls. Respiratory system: Reduced breath sounds in both bases, right >left with coarse chronic sounding Velcro-like crackles in the bases. Breath sounds have significantly improved since admission and decreased crackles in the bases. No wheezing or rhonchi. No increased work of breathing. Cardiovascular system: S1 & S2 heard, RRR. No JVD, murmurs, gallops, clicks or pedal edema.  Gastrointestinal system: Abdomen is nondistended, soft and nontender. Normal bowel sounds heard. Central nervous system: Alert and oriented. No focal neurological deficits. Extremities: Symmetric 5 x 5 power.  Discharge Instructions      Discharge Instructions    Call MD for:  difficulty breathing, headache or visual disturbances    Complete by:  As directed      Call MD for:  extreme fatigue    Complete by:  As directed      Call MD for:  hives    Complete by:  As  directed      Call MD for:  persistant dizziness or light-headedness    Complete by:  As directed      Call MD for:  persistant nausea and vomiting    Complete by:  As directed      Call MD for:  severe uncontrolled pain    Complete by:  As directed      Call MD for:  temperature >100.4    Complete by:  As directed      Diet - low sodium heart healthy    Complete by:  As directed      Increase activity slowly    Complete by:  As directed             Medication List    TAKE these medications        albuterol 108 (90 BASE) MCG/ACT inhaler  Commonly known as:  PROAIR HFA  Inhale 2 puffs into the lungs every 6 (six) hours as needed for wheezing or shortness of breath.     aspirin EC 81 MG tablet  Take 81 mg by mouth daily.     b complex vitamins tablet  Take 1 tablet by mouth daily.     bisacodyl 5 MG EC tablet  Commonly known as:  DULCOLAX  Take 5 mg by mouth 2 (two) times daily.     budesonide-formoterol 160-4.5 MCG/ACT inhaler  Commonly known as:  SYMBICORT  Inhale 2 puffs into the lungs 2 (two) times daily.     calcium citrate-vitamin D 315-200 MG-UNIT per tablet  Commonly known as:  CITRACAL+D  Take 1 tablet by mouth daily.     ciprofloxacin 500 MG tablet  Commonly known as:  CIPRO  Take 500 mg by mouth 2 (two) times daily as needed (lungs).     CITRUCEL oral powder  Generic drug:  methylcellulose  Take 1 packet by mouth daily.     clopidogrel 75 MG tablet  Commonly known as:  PLAVIX  Take 1 tablet (75 mg total) by mouth daily with breakfast.     Cyclophosphamide 50 MG Caps  Take 1 capsule (50 mg total) by mouth daily. Take on an empty stomach 1 hour before or 2 hours after meals.  Currently stopped in the hospital. Please follow-up with your PCP or Pulmonologist regarding decision to resume.     dextromethorphan 30 MG/5ML liquid  Commonly known as:  DELSYM  Take 30 mg by mouth 2 (two) times daily as needed for cough.     finasteride 5 MG tablet   Commonly known as:  PROSCAR  Take 1 tablet (5 mg total) by mouth daily.     guaiFENesin 600 MG 12 hr tablet  Commonly known as:  MUCINEX  Take 600 mg by mouth 2 (two) times daily.     HYDROcodone-homatropine 5-1.5 MG/5ML syrup  Commonly known as:  HYDROMET  Take 5 mLs by mouth every 4 (four) hours as needed.     levofloxacin 750 MG tablet  Commonly known as:  LEVAQUIN  TAKE ONE TABLET BY MOUTH ONCE DAILY AS NEEDED FOR LUNGS. Take 1 tab (last dose) on 04/23/2015     loratadine 10 MG tablet  Commonly known as:  CLARITIN  Take 10 mg by mouth daily.     metoprolol succinate 25 MG 24 hr tablet  Commonly known as:  TOPROL-XL  Take 1 tablet (25 mg total) by mouth daily.     nitroGLYCERIN 0.4 MG SL tablet  Commonly known as:  NITROSTAT  Place 1 tablet (0.4 mg total) under the tongue every 5 (five) minutes x 3 doses as needed for chest pain.     prednisoLONE 5 MG Tabs tablet  Take 7.5 mg by mouth daily.     ranitidine 300 MG tablet  Commonly known as:  ZANTAC  Take 1 tablet (300 mg total) by mouth 2 (two) times daily.     simvastatin 20 MG tablet  Commonly known as:  ZOCOR  Take 1 tablet (20 mg total) by mouth daily at 6 PM.     sodium bicarbonate 650 MG tablet  TAKE TWO TABLETS BY MOUTH TWICE DAILY     tamsulosin 0.4 MG Caps capsule  Commonly known as:  FLOMAX  TAKE ONE CAPSULE BY MOUTH EVERY EVENING     traMADol 50 MG tablet  Commonly known as:  ULTRAM  Take 1 tablet (50 mg total) by mouth daily as needed for moderate pain.       Follow-up Information    Schedule an appointment as soon as possible for a visit with [name], MD.   Specialty:  Family Medicine   Why:  to be seen in 3-5 days with repeat labs (CBC & BMP).   Contact information:   [place] [address] [phone]       Schedule an appointment as soon as possible for a visit with Christinia Gully, MD.  Specialty:  Pulmonary Disease   Contact information:   [address] [place] [address] [phone]       Schedule an appointment as soon as possible for a visit with DETERDING,JAMES L, MD.   Specialty:  Nephrology   Contact information:   [place] [address] [phone]        The results of significant diagnostics from this hospitalization (including imaging, microbiology, ancillary and laboratory) are listed below for reference.    Significant Diagnostic Studies: Dg Chest 2 View  04/18/2015   CLINICAL DATA:  Fever last night  EXAM: CHEST  2 VIEW  COMPARISON:  None.  FINDINGS: There are fibrotic changes throughout the lungs. New patchy airspace opacities have developed at the right lung base. Hiatal hernia is noted. Upper normal heart size.  IMPRESSION: New right basilar airspace disease. Followup PA and lateral chest X-ray is recommended in 3-4 weeks following trial of antibiotic therapy to ensure resolution and exclude underlying malignancy.   Electronically Signed   By: Marybelle Killings M.D.   On: 04/18/2015 13:40   Dg Chest 2 View  04/09/2015   CLINICAL DATA:  Cough, shortness of breath ; history of asthma, Wegner's granulomatosis, fungal infection of the lungs, coronary artery disease.  EXAM: CHEST  2 VIEW  COMPARISON:  PA and lateral chest x-ray of August 19, 2014  FINDINGS: The lungs are hyperinflated with hemidiaphragm flattening. The interstitial markings are increased bilaterally but are slightly less conspicuous than on the previous study. There is a moderate-sized hiatal hernia. The heart and pulmonary vascularity are normal. There is no pleural effusion. The bony thorax exhibits no acute abnormality.  IMPRESSION: 1. Stable hyperinflation and parenchymal fibrotic changes. There is no acute cardiopulmonary abnormality. 2. Moderate-sized hiatal hernia.   Electronically Signed   By: David  Martinique M.D.   On: 04/09/2015 17:30    Microbiology: Recent Results (from the past 240 hour(s))  Culture, blood (routine x 2)     Status:  None (Preliminary result)   Collection Time: 04/18/15  4:49 PM  Result Value Ref Range Status   Specimen Description BLOOD LEFT ARM  Final   Special Requests   Final    BOTTLES DRAWN AEROBIC AND ANAEROBIC 10CC BOTH BOTTLES   Culture   Final    NO GROWTH 2 DAYS Performed at [facility]    Report Status PENDING  Incomplete  Culture, blood (routine x 2)     Status: None (Preliminary result)   Collection Time: 04/18/15  4:55 PM  Result Value Ref Range Status   Specimen Description BLOOD LEFT ARM  Final   Special Requests   Final    BOTTLES DRAWN AEROBIC AND ANAEROBIC 10CC BOTH BOTTLES   Culture   Final    NO GROWTH 2 DAYS Performed at [facility]    Report Status PENDING  Incomplete  Culture, sputum-assessment     Status: None   Collection Time: 04/18/15  7:20 PM  Result Value Ref Range Status   Specimen Description SPUTUM  Final   Special Requests NONE  Final   Sputum evaluation   Final    THIS SPECIMEN IS ACCEPTABLE. RESPIRATORY CULTURE REPORT TO FOLLOW.   Report Status 04/18/2015 FINAL  Final  Culture, respiratory (NON-Expectorated)     Status: None   Collection Time: 04/18/15  7:20 PM  Result Value Ref Range Status   Specimen Description SPU  Final   Special Requests NONE  Final   Gram  Stain   Final    MODERATE WBC PRESENT,BOTH PMN AND MONONUCLEAR NO SQUAMOUS EPITHELIAL CELLS SEEN MODERATE GRAM POSITIVE RODS Performed at [facility]    Culture   Final    NORMAL OROPHARYNGEAL FLORA Performed at [facility]    Report Status 04/21/2015 FINAL  Final     Labs: Basic Metabolic Panel:  Recent Labs Lab 04/18/15 1225 04/19/15 0527 04/20/15 0439  NA 138 143 143  K 4.2 4.7 4.0  CL 109 113* 113*  CO2 23 24 23   GLUCOSE 100* 114* 110*  BUN 36* 32* 34*  CREATININE 2.18* 2.18* 2.20*  CALCIUM 8.4* 8.4* 8.3*   Liver Function Tests:  Recent Labs Lab 04/18/15 1225  AST 15  ALT 10*  ALKPHOS 82  BILITOT 0.9  PROT 5.8*  ALBUMIN  3.1*   No results for input(s): LIPASE, AMYLASE in the last 168 hours. No results for input(s): AMMONIA in the last 168 hours. CBC:  Recent Labs Lab 04/18/15 1225 04/19/15 0527 04/20/15 0439 04/21/15 0508  WBC 8.7 5.3 4.9 4.5  HGB 9.6* 8.7* 8.1* 8.0*  HCT 29.2* 26.2* 24.6* 24.5*  MCV 111.0* 111.5* 111.8* 110.4*  PLT 176 150 151 165   Cardiac Enzymes: No results for input(s): CKTOTAL, CKMB, CKMBINDEX, TROPONINI in the last 168 hours. BNP: BNP (last 3 results) No results for input(s): BNP in the last 8760 hours.  ProBNP (last 3 results) No results for input(s): PROBNP in the last 8760 hours.  CBG: No results for input(s): GLUCAP in the last 168 hours.      Signed:  Vernell Leep, MD, FACP, FHM. [facility] Pager [phone]  If 7PM-7AM, please contact night-coverage [email or web address] Password [facility] 04/21/2015, 2:46 PM

## 2015-04-21 NOTE — Discharge Instructions (Signed)

## 2015-04-23 ENCOUNTER — Other Ambulatory Visit: Payer: Self-pay | Admitting: Internal Medicine

## 2015-04-23 LAB — CULTURE, BLOOD (ROUTINE X 2)
CULTURE: NO GROWTH
Culture: NO GROWTH

## 2015-04-27 ENCOUNTER — Other Ambulatory Visit: Payer: Self-pay | Admitting: *Deleted

## 2015-04-27 MED ORDER — METOPROLOL SUCCINATE ER 25 MG PO TB24: 25.0000 mg | ORAL_TABLET | Freq: Every day | ORAL | Status: DC

## 2015-04-28 ENCOUNTER — Ambulatory Visit (INDEPENDENT_AMBULATORY_CARE_PROVIDER_SITE_OTHER): Payer: Medicare Other | Admitting: Family Medicine

## 2015-04-28 ENCOUNTER — Encounter: Payer: Self-pay | Admitting: Family Medicine

## 2015-04-28 VITALS — BP 95/60 | HR 91 | Temp 98.5°F | Ht 66.0 in | Wt 117.0 lb

## 2015-04-28 DIAGNOSIS — J189 Pneumonia, unspecified organism: Secondary | ICD-10-CM

## 2015-04-28 DIAGNOSIS — R9389 Abnormal findings on diagnostic imaging of other specified body structures: Secondary | ICD-10-CM

## 2015-04-28 DIAGNOSIS — M3131 Wegener's granulomatosis with renal involvement: Secondary | ICD-10-CM

## 2015-04-28 DIAGNOSIS — R938 Abnormal findings on diagnostic imaging of other specified body structures: Secondary | ICD-10-CM

## 2015-04-28 LAB — BASIC METABOLIC PANEL
BUN: 26 mg/dL — ABNORMAL HIGH (ref 6–23)
CHLORIDE: 104 meq/L (ref 96–112)
CO2: 27 meq/L (ref 19–32)
Calcium: 8.7 mg/dL (ref 8.4–10.5)
Creatinine, Ser: 1.83 mg/dL — ABNORMAL HIGH (ref 0.40–1.50)
GFR: 38.36 mL/min — ABNORMAL LOW (ref >60.00)
Glucose, Bld: 103 mg/dL — ABNORMAL HIGH (ref 70–99)
POTASSIUM: 4.3 meq/L (ref 3.5–5.1)
SODIUM: 142 meq/L (ref 135–145)

## 2015-04-28 LAB — CBC WITH DIFFERENTIAL/PLATELET
BASOS PCT: 0.1 % (ref 0.0–3.0)
Basophils Absolute: 0 10*3/uL (ref 0.0–0.1)
EOS ABS: 0.1 10*3/uL (ref 0.0–0.7)
Eosinophils Relative: 0.7 % (ref 0.0–5.0)
HEMATOCRIT: 28.9 % — AB (ref 39.0–52.0)
HEMOGLOBIN: 9.4 g/dL — AB (ref 13.0–17.0)
LYMPHS PCT: 4.8 % — AB (ref 12.0–46.0)
Lymphs Abs: 0.5 10*3/uL — ABNORMAL LOW (ref 0.7–4.0)
MCHC: 32.7 g/dL (ref 30.0–36.0)
MCV: 109.5 fl — AB (ref 78.0–100.0)
MONOS PCT: 5.6 % (ref 3.0–12.0)
Monocytes Absolute: 0.6 10*3/uL (ref 0.1–1.0)
NEUTROS ABS: 9.4 10*3/uL — AB (ref 1.4–7.7)
Neutrophils Relative %: 88.8 % — ABNORMAL HIGH (ref 43.0–77.0)
PLATELETS: 245 10*3/uL (ref 150.0–400.0)
RBC: 2.64 Mil/uL — ABNORMAL LOW (ref 4.22–5.81)
RDW: 15.9 % — AB (ref 11.5–15.5)
WBC: 10.6 10*3/uL — AB (ref 4.0–10.5)

## 2015-04-28 NOTE — Progress Notes (Signed)
   Subjective:    Patient ID: Richard [name], male    DOB: 1936/05/23, 79 y.o.   MRN: [id number]  HPI Here to follow up a hospital stay from 04-18-15 to 04-21-15 for CAP. This was centered in the RLL as seen on admission CXR. He was treated with IV antibiotics and was sent home with a few days of oral Levaquin. He feels much better now although he is fatigued.  He still has a dry cough and some right rib discomfort. He is eating and drinking normally. No fever. His blood cultures remained negative.    Review of Systems  Constitutional: Positive for fatigue. Negative for fever, chills and diaphoresis.  Respiratory: Positive for cough. Negative for chest tightness, shortness of breath and wheezing.   Cardiovascular: Negative for palpitations and leg swelling.       Objective:   Physical Exam  Constitutional: He is oriented to person, place, and time. He appears well-developed and well-nourished. No distress.  He appears at his baseline  Neck: No thyromegaly present.  Cardiovascular: Normal rate, regular rhythm, normal heart sounds and intact distal pulses.   Pulmonary/Chest: Effort normal. No respiratory distress. He has no rales.  Soft scattered wheezes   Lymphadenopathy:    He has no cervical adenopathy.  Neurological: He is alert and oriented to person, place, and time.          Assessment & Plan:  He seems to be recovering from a CAP as expected. We will check a CBC and BMET today. Check a CXR in about 2 weeks

## 2015-04-28 NOTE — Progress Notes (Signed)
Pre visit review using our clinic review tool, if applicable. No additional management support is needed unless otherwise documented below in the visit note. 

## 2015-05-04 ENCOUNTER — Other Ambulatory Visit: Payer: Self-pay | Admitting: Internal Medicine

## 2015-05-04 NOTE — Telephone Encounter (Signed)
Rx request sent to pharmacy.  

## 2015-05-05 ENCOUNTER — Encounter (HOSPITAL_COMMUNITY)
Admission: RE | Admit: 2015-05-05 | Discharge: 2015-05-05 | Disposition: A | Discharge status: Home / Self Care | Payer: Medicare Other | Source: Ambulatory Visit | Attending: Nephrology | Admitting: Nephrology

## 2015-05-05 DIAGNOSIS — Z5181 Encounter for therapeutic drug level monitoring: Secondary | ICD-10-CM | POA: Insufficient documentation

## 2015-05-05 DIAGNOSIS — D631 Anemia in chronic kidney disease: Secondary | ICD-10-CM | POA: Diagnosis not present

## 2015-05-05 DIAGNOSIS — Z79899 Other long term (current) drug therapy: Secondary | ICD-10-CM | POA: Diagnosis not present

## 2015-05-05 DIAGNOSIS — N183 Chronic kidney disease, stage 3 (moderate): Secondary | ICD-10-CM | POA: Diagnosis present

## 2015-05-05 LAB — IRON AND TIBC
Iron: 82 ug/dL (ref 45–182)
SATURATION RATIOS: 40 % — AB (ref 17.9–39.5)
TIBC: 204 ug/dL — AB (ref 250–450)
UIBC: 122 ug/dL

## 2015-05-05 LAB — FERRITIN: FERRITIN: 566 ng/mL — AB (ref 24–336)

## 2015-05-05 LAB — POCT HEMOGLOBIN-HEMACUE: HEMOGLOBIN: 9.1 g/dL — AB (ref 13.0–17.0)

## 2015-05-05 MED ORDER — CLONIDINE HCL 0.1 MG PO TABS: 0.1000 mg | ORAL_TABLET | Freq: Once | ORAL | Status: AC | PRN

## 2015-05-05 MED ORDER — EPOETIN ALFA 40000 UNIT/ML IJ SOLN: 30000.0000 [IU] | INTRAMUSCULAR | Status: DC

## 2015-05-05 MED ORDER — EPOETIN ALFA 20000 UNIT/ML IJ SOLN
INTRAMUSCULAR | Status: AC
  Administered 2015-05-05: 20000 [IU] via SUBCUTANEOUS
  Filled 2015-05-05: qty 1

## 2015-05-05 MED ORDER — EPOETIN ALFA 10000 UNIT/ML IJ SOLN
INTRAMUSCULAR | Status: AC
  Administered 2015-05-05: 10000 [IU] via SUBCUTANEOUS
  Filled 2015-05-05: qty 1

## 2015-05-12 ENCOUNTER — Encounter (HOSPITAL_COMMUNITY)
Admission: RE | Admit: 2015-05-12 | Discharge: 2015-05-12 | Disposition: A | Discharge status: Home / Self Care | Payer: Medicare Other | Source: Ambulatory Visit | Attending: Nephrology | Admitting: Nephrology

## 2015-05-12 DIAGNOSIS — D631 Anemia in chronic kidney disease: Secondary | ICD-10-CM | POA: Insufficient documentation

## 2015-05-12 DIAGNOSIS — Z79899 Other long term (current) drug therapy: Secondary | ICD-10-CM | POA: Insufficient documentation

## 2015-05-12 DIAGNOSIS — N183 Chronic kidney disease, stage 3 (moderate): Secondary | ICD-10-CM | POA: Insufficient documentation

## 2015-05-12 DIAGNOSIS — Z5181 Encounter for therapeutic drug level monitoring: Secondary | ICD-10-CM | POA: Insufficient documentation

## 2015-05-12 LAB — POCT HEMOGLOBIN-HEMACUE: Hemoglobin: 8.8 g/dL — ABNORMAL LOW (ref 13.0–17.0)

## 2015-05-12 IMAGING — RF DG ESOPHAGUS
10 of 16 series · 14 of 24 positions shown · non-contrast
Comparison: None.

CLINICAL DATA: Pharyngo esophageal phase dysphagia.  Weight loss.

EXAM:
ESOPHOGRAM / BARIUM SWALLOW / BARIUM TABLET STUDY
TECHNIQUE: Combined double contrast and single contrast examination performed
using effervescent crystals, thick barium liquid, and thin barium
liquid. The patient was observed with fluoroscopy swallowing a 13mm
barium sulphate tablet.
FLUOROSCOPY TIME:  2 minutes 5 seconds
Number of acquired images: 16

[Series 1: run · 1 of 1 slices shown (1 of 10)]
[im 1/1]
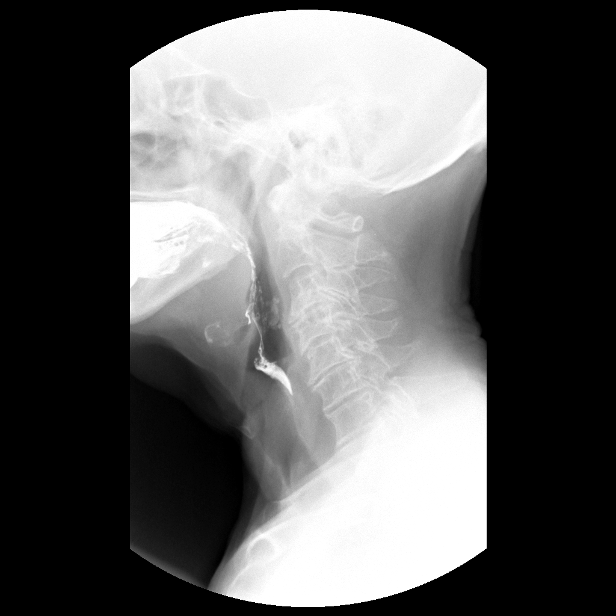

[Series 2: run · 5 of 26 slices shown (2 of 10)]
[im 4/26]
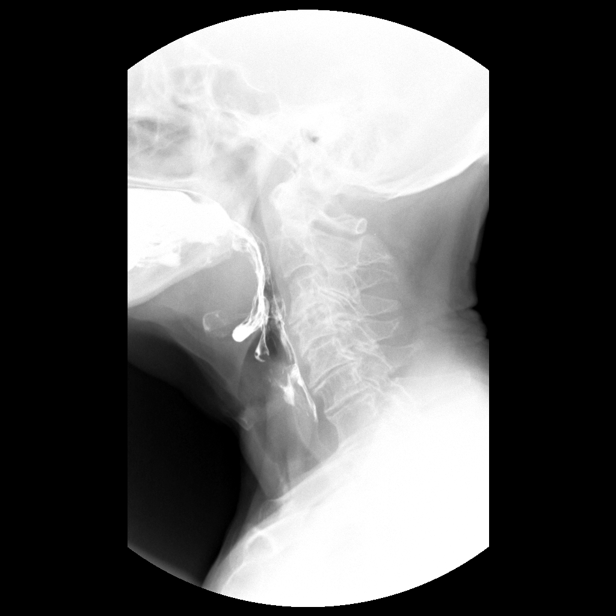
[im 10/26]
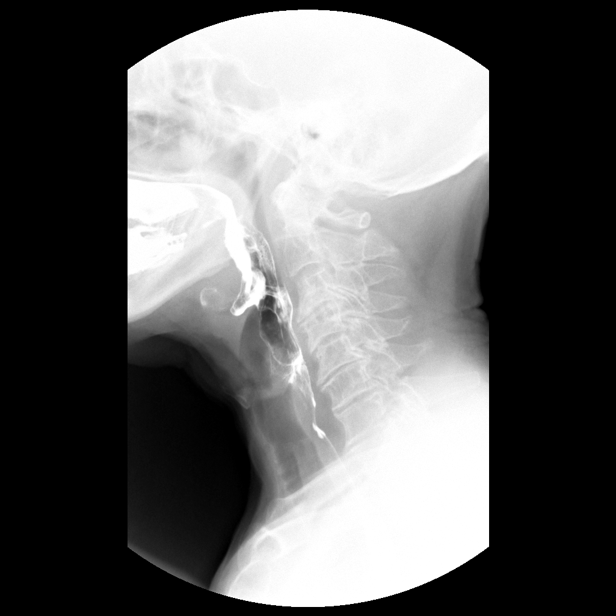
[im 16/26]
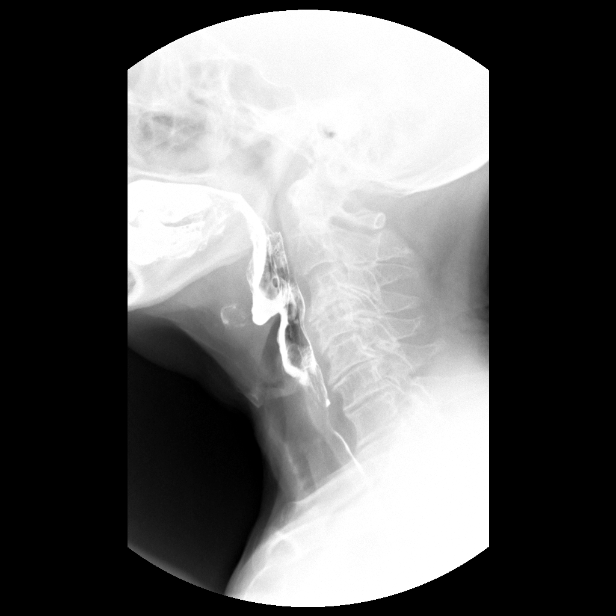
[im 19/26]
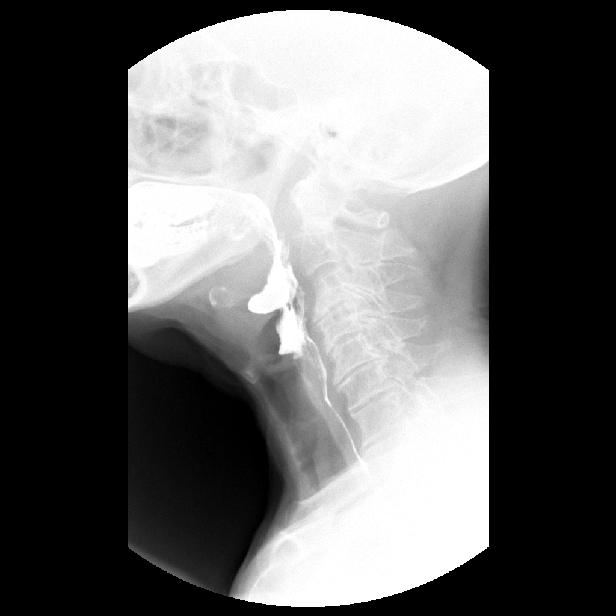
[im 26/26]
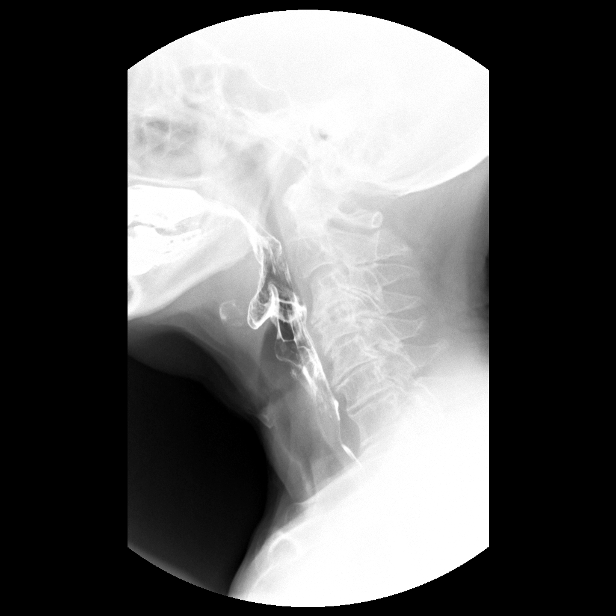

[Series 4: run · 1 of 1 slices shown (3 of 10)]
[im 1/1]
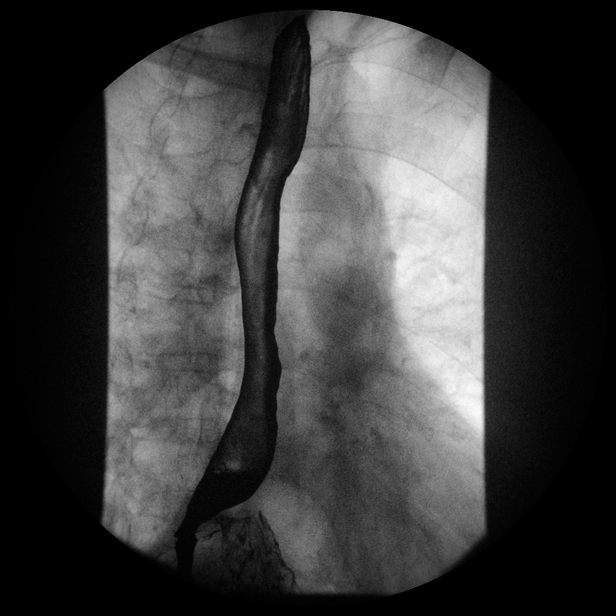

[Series 5: run · 1 of 1 slices shown (4 of 10)]
[im 1/1]
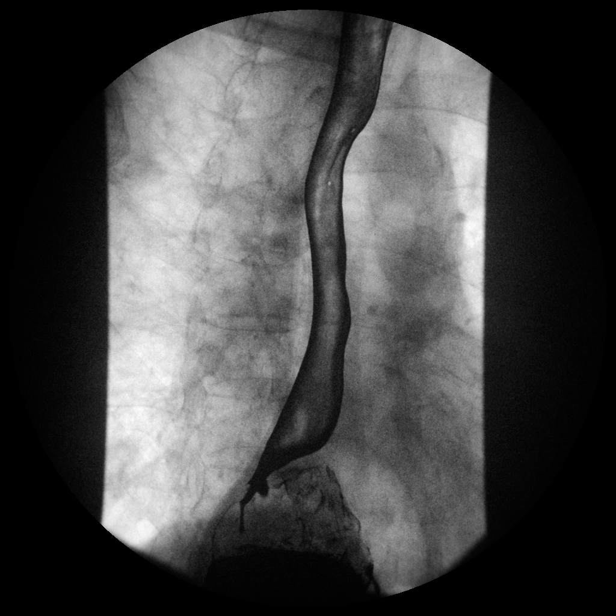

[Series 7: run · 1 of 1 slices shown (5 of 10)]
[im 1/1]
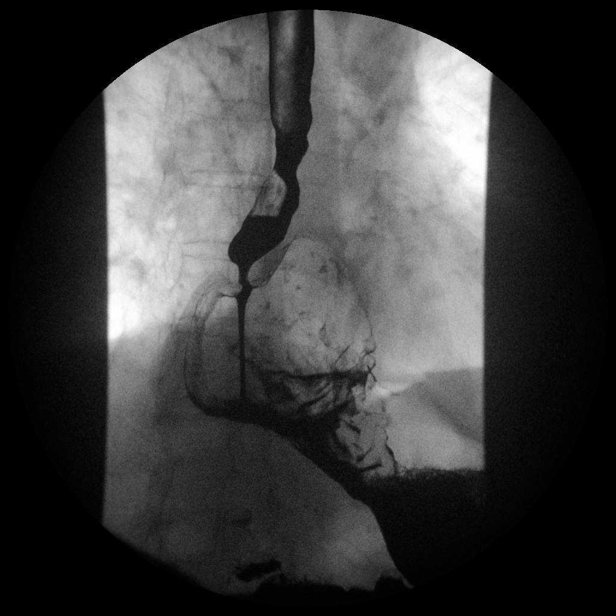

[Series 9: run · 1 of 1 slices shown (6 of 10)]
[im 1/1]
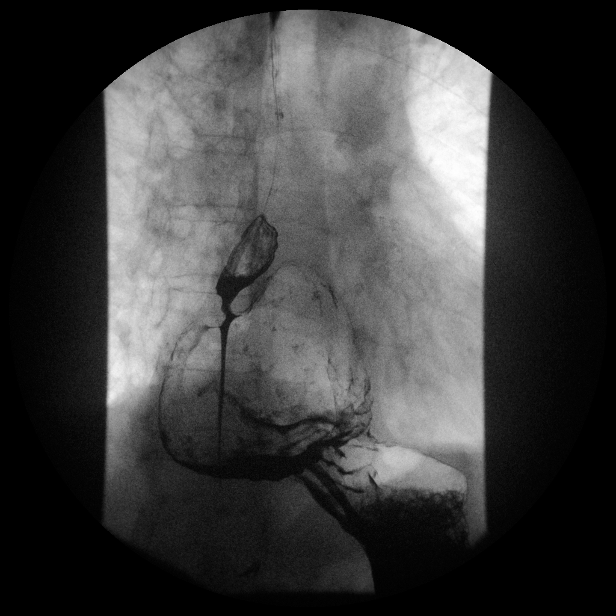

[Series 11: run · 1 of 1 slices shown (7 of 10)]
[im 1/1]
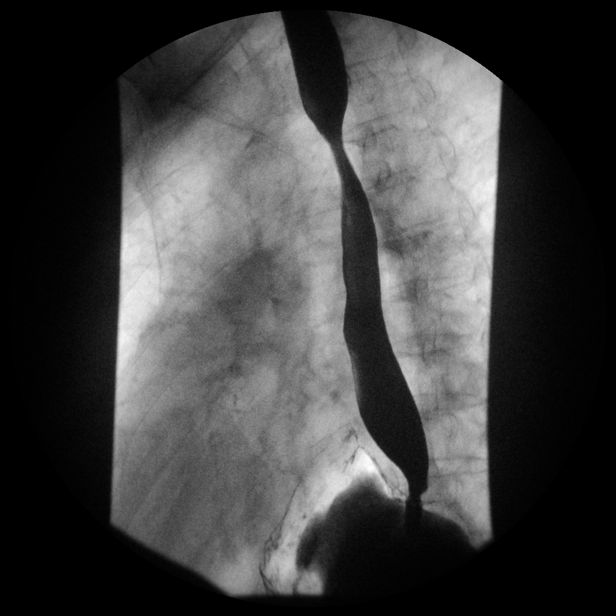

[Series 12: run · 1 of 1 slices shown (8 of 10)]
[im 1/1]
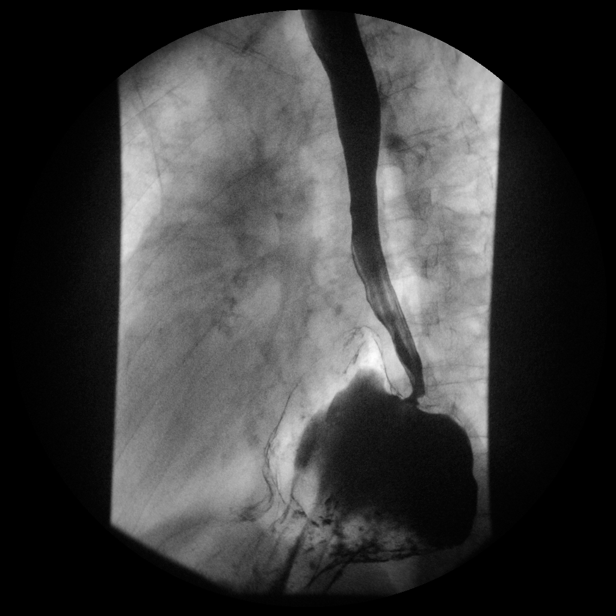

[Series 14: run · 1 of 1 slices shown (9 of 10)]
[im 1/1]
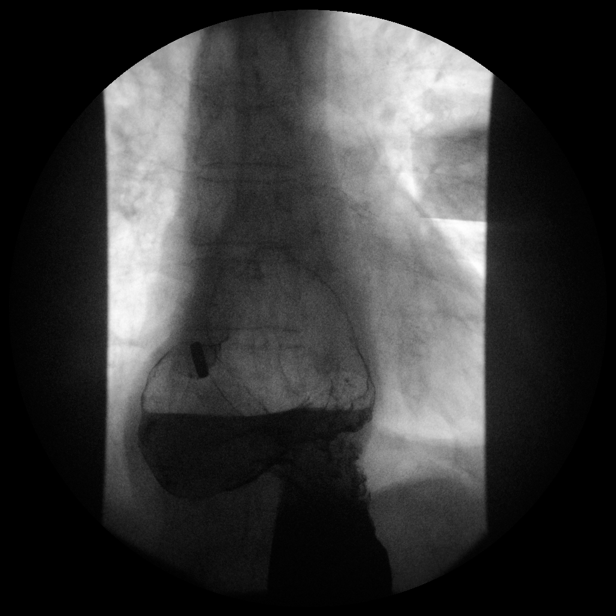

[Series 16: run · 1 of 1 slices shown (10 of 10)]
[im 1/1]
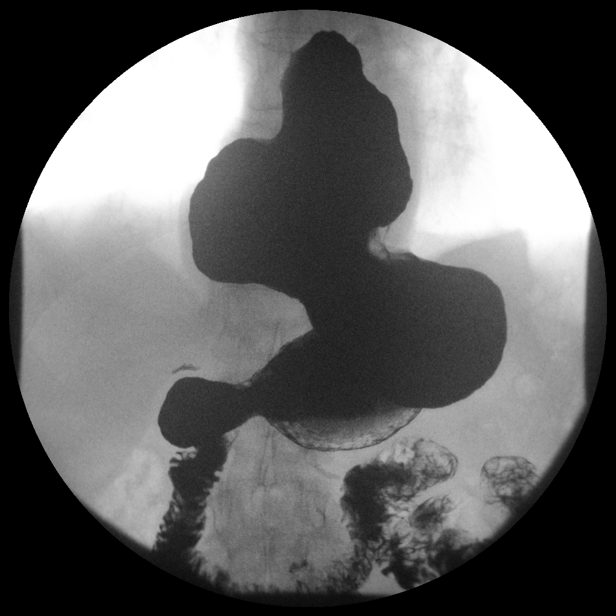

[14 of 24 positions shown; findings below may reference images not displayed]

FINDINGS: No evidence of vestibular penetration or aspiration during
swallowing. The oropharynx and hypopharynx are normal in appearance.

A moderate size sliding hiatal hernia is seen. A smoothly tapered
stricture is seen at the gastroesophageal junction, which has benign
radiographic features. No other esophageal strictures identified. No
definite evidence of esophagitis.

Mild esophageal dysmotility is noted with breakup of primary
peristalsis. An ingested 13mm barium tablet did not pass through the
stricture at the gastroesophageal junction. No gastroesophageal
reflux of contrast seen during the exam.
IMPRESSION: Moderate sliding hiatal hernia. No gastroesophageal reflux of
contrast demonstrated.

Benign-appearing stricture at the gastroesophageal junction, which
cause retention of a 13 mm barium tablet. Endoscopy is recommended
for further evaluation.

## 2015-05-12 MED ORDER — EPOETIN ALFA 40000 UNIT/ML IJ SOLN: 30000.0000 [IU] | INTRAMUSCULAR | Status: DC

## 2015-05-12 MED ORDER — EPOETIN ALFA 20000 UNIT/ML IJ SOLN
INTRAMUSCULAR | Status: AC
  Administered 2015-05-12: 20000 [IU] via SUBCUTANEOUS
  Filled 2015-05-12: qty 1

## 2015-05-12 MED ORDER — EPOETIN ALFA 10000 UNIT/ML IJ SOLN
INTRAMUSCULAR | Status: AC
  Administered 2015-05-12: 10000 [IU] via SUBCUTANEOUS
  Filled 2015-05-12: qty 1

## 2015-05-14 NOTE — ED Provider Notes (Signed)
CSN: [id number]     Arrival date & time 04/18/15  1153 History   First MD Initiated Contact with Patient 04/18/15 1217     Chief Complaint  Patient presents with  . Shortness of Breath  . Fever    HPI LATE NOTE - Computer error   This patient p/w fatigue and dyspnea.  Onset was some time ago, but Sx have progressed in the past few days.  There is also new f/c w productive cough. He has a notable Hx of Wegeners Granulomatosis w episodic use of Moxi / Levoflox.  He started ABX yesterday w no change in his condition.   Past Medical History  Diagnosis Date  . GERD (gastroesophageal reflux disease)   . Asthma   . Fungal infection     lungs  . Cataract   . BPH (benign prostatic hyperplasia)     sees Dr. Risa Grill, biopsy June 2015 was benign   . Wegener's granulomatosis     sees Dr. [name]   . Peptic stricture of esophagus   . Anemia   . Hiatal hernia   . Adenomatous colon polyp   . Bronchiectasis   . On home oxygen therapy     uses 2 l/m nasally at bedtime  . HOH (hard of hearing)     bilaterally  . HTN (hypertension) 09/28/2013  . CAD (coronary artery disease)     a. [place] s/p [place] x3 and ultimately BMS to pLAD& POBA to CTO of circumflex/marginal vessel on 03/17/14 and staged BMS to [facility] on 03/21/14  . Femoral artery pseudo-aneurysm, right     a. s/p repair. Follow up with Dr. Kellie Simmering   . Diverticulosis    Past Surgical History  Procedure Laterality Date  . Hernia repair    . Eye surgery      cataracts removed.   . Colonoscopy  08-16-11    per Dr. Fuller Plan, diverticulosis and polyps, repeat in 5 yrs   . Esophagogastroduodenoscopy (egd) with esophageal dilation  11-29-10    per Dr. Fuller Plan   . Cataract extraction, bilateral  12-18-12    bilateral  . Cholecystectomy N/A 12/21/2012    Procedure: LAPAROSCOPIC CHOLECYSTECTOMY WITH INTRAOPERATIVE CHOLANGIOGRAM;  Surgeon: Edward Jolly, MD;  Location: [facility] ORS;  Service: General;  Laterality: N/A;  . Hematoma evacuation Right 03/19/2014     Procedure: Suture repair of femoral artery with evacuation of hematoma;  Surgeon: Mal Misty, MD;  Location: [facility] OR;  Service: Vascular;  Laterality: Right;  . Coronary angioplasty  03/2014  . Left heart catheterization with coronary angiogram N/A 03/14/2014    Procedure: LEFT HEART CATHETERIZATION WITH CORONARY ANGIOGRAM;  Surgeon: Leonie Man, MD;  Location: [facility] CATH LAB;  Service: Cardiovascular;  Laterality: N/A;  . Percutaneous coronary stent intervention (pci-s) N/A 03/17/2014    Procedure: PERCUTANEOUS CORONARY STENT INTERVENTION (PCI-S);  Surgeon: Troy Sine, MD;  Location: [facility] CATH LAB;  Service: Cardiovascular;  Laterality: N/A;  . Cardiac catheterization  03/17/2014    Procedure: CORONARY BALLOON ANGIOPLASTY;  Surgeon: Troy Sine, MD;  Location: [facility] CATH LAB;  Service: Cardiovascular;;  . Percutaneous coronary stent intervention (pci-s) N/A 03/21/2014    Procedure: PERCUTANEOUS CORONARY STENT INTERVENTION (PCI-S);  Surgeon: Burnell Blanks, MD;  Location: [facility] CATH LAB;  Service: Cardiovascular;  Laterality: N/A;  . Renal biopsy     Family History  Problem Relation Age of Onset  . Asthma Father   . Coronary artery disease Brother   . Coronary artery disease  Brother   . Coronary artery disease Mother   . Colon cancer Neg Hx   . Stomach cancer Neg Hx    Social History  Substance Use Topics  . Smoking status: Never Smoker   . Smokeless tobacco: Never Used  . Alcohol Use: No    Review of Systems  Constitutional:       Per HPI, otherwise negative  HENT:       Per HPI, otherwise negative  Respiratory:       Per HPI, otherwise negative  Cardiovascular:       Per HPI, otherwise negative  Gastrointestinal: Negative for vomiting.  Endocrine:       Negative aside from HPI  Genitourinary:       Neg aside from HPI   Musculoskeletal:       Per HPI, otherwise negative  Skin: Negative.   Allergic/Immunologic: Positive for immunocompromised state.  Neurological:  Negative for syncope.      Allergies  Review of patient's allergies indicates no known allergies.  Home Medications   Prior to Admission medications   Medication Sig Start Date End Date Taking? Authorizing Provider  albuterol (PROAIR HFA) 108 (90 BASE) MCG/ACT inhaler Inhale 2 puffs into the lungs every 6 (six) hours as needed for wheezing or shortness of breath. 04/09/15  Yes Tanda Rockers, MD  aspirin EC 81 MG tablet Take 81 mg by mouth daily.   Yes Historical Provider, MD  b complex vitamins tablet Take 1 tablet by mouth daily.     Yes Historical Provider, MD  bisacodyl (DULCOLAX) 5 MG EC tablet Take 5 mg by mouth 2 (two) times daily.    Yes Historical Provider, MD  budesonide-formoterol (SYMBICORT) 160-4.5 MCG/ACT inhaler Inhale 2 puffs into the lungs 2 (two) times daily.   Yes Historical Provider, MD  calcium citrate-vitamin D (CITRACAL+D) 315-200 MG-UNIT per tablet Take 1 tablet by mouth daily.   Yes Historical Provider, MD  ciprofloxacin (CIPRO) 500 MG tablet Take 500 mg by mouth 2 (two) times daily as needed (lungs).    Yes Historical Provider, MD  dextromethorphan (DELSYM) 30 MG/5ML liquid Take 30 mg by mouth 2 (two) times daily as needed for cough.   Yes Historical Provider, MD  finasteride (PROSCAR) 5 MG tablet Take 1 tablet (5 mg total) by mouth daily. 11/24/14  Yes [name], MD  guaiFENesin (MUCINEX) 600 MG 12 hr tablet Take 600 mg by mouth 2 (two) times daily.   Yes Historical Provider, MD  HYDROcodone-homatropine (HYDROMET) 5-1.5 MG/5ML syrup Take 5 mLs by mouth every 4 (four) hours as needed. Patient not taking: Reported on 04/28/2015 11/24/14  Yes [name], MD  loratadine (CLARITIN) 10 MG tablet Take 10 mg by mouth daily.    Yes Historical Provider, MD  methylcellulose (CITRUCEL) oral powder Take 1 packet by mouth daily.   Yes Historical Provider, MD  nitroGLYCERIN (NITROSTAT) 0.4 MG SL tablet Place 1 tablet (0.4 mg total) under the tongue every 5 (five) minutes x 3  doses as needed for chest pain. Patient not taking: Reported on 04/28/2015 03/24/15  Yes Pixie Casino, MD  prednisoLONE 5 MG TABS tablet Take 7.5 mg by mouth daily.   Yes Historical Provider, MD  ranitidine (ZANTAC) 300 MG tablet Take 1 tablet (300 mg total) by mouth 2 (two) times daily. 04/07/15  Yes Ladene Artist, MD  sodium bicarbonate 650 MG tablet TAKE TWO TABLETS BY MOUTH TWICE DAILY 02/10/15  Yes Pixie Casino, MD  tamsulosin (FLOMAX) 0.4 MG CAPS capsule TAKE ONE CAPSULE BY MOUTH EVERY EVENING 03/25/15  Yes [name], MD  clopidogrel (PLAVIX) 75 MG tablet TAKE ONE TABLET BY MOUTH ONCE DAILY WITH BREAKFAST 05/04/15   Mihai Croitoru, MD  Cyclophosphamide 50 MG CAPS Take 1 capsule (50 mg total) by mouth daily. Take on an empty stomach 1 hour before or 2 hours after meals.  Currently stopped in the hospital. Please follow-up with your PCP or Pulmonologist regarding decision to resume. 04/21/15   Modena Jansky, MD  levofloxacin (LEVAQUIN) 750 MG tablet TAKE ONE TABLET BY MOUTH ONCE DAILY AS NEEDED FOR LUNGS. Take 1 tab (last dose) on 04/23/2015 Patient not taking: Reported on 04/28/2015 04/21/15   Modena Jansky, MD  metoprolol succinate (TOPROL-XL) 25 MG 24 hr tablet Take 1 tablet (25 mg total) by mouth daily. 04/27/15   Pixie Casino, MD  simvastatin (ZOCOR) 20 MG tablet TAKE ONE TABLET BY MOUTH ONCE DAILY AT  6  PM 04/23/15   Pixie Casino, MD  traMADol (ULTRAM) 50 MG tablet Take 1 tablet (50 mg total) by mouth daily as needed for moderate pain. 04/21/15   Modena Jansky, MD   BP 96/58 mmHg  Pulse 96  Temp(Src) 97.8 F (36.6 C) (Oral)  Resp 18  Ht 5\' 6"  (1.676 m)  Wt 113 lb 1.6 oz (51.302 kg)  BMI 18.26 kg/m2  SpO2 98% Physical Exam  Constitutional: He is oriented to person, place, and time. He appears well-developed. No distress.  HENT:  Head: Normocephalic and atraumatic.  Eyes: Conjunctivae and EOM are normal.  Cardiovascular: Normal rate and regular rhythm.    Pulmonary/Chest: No stridor. Tachypnea noted. He has decreased breath sounds. He has rhonchi.  Abdominal: He exhibits no distension.  Musculoskeletal: He exhibits no edema.  Neurological: He is alert and oriented to person, place, and time.  Skin: Skin is warm and dry.  Psychiatric: He has a normal mood and affect.  Nursing note and vitals reviewed.   ED Course  Procedures (including critical care time) Labs Review Labs Reviewed  CBC - Abnormal; Notable for the following:    RBC 2.63 (*)    Hemoglobin 9.6 (*)    HCT 29.2 (*)    MCV 111.0 (*)    [name] 36.5 (*)    All other components within normal limits  COMPREHENSIVE METABOLIC PANEL - Abnormal; Notable for the following:    Glucose, Bld 100 (*)    BUN 36 (*)    Creatinine, Ser 2.18 (*)    Calcium 8.4 (*)    Total Protein 5.8 (*)    Albumin 3.1 (*)    ALT 10 (*)    GFR calc non Af Amer 28 (*)    GFR calc Af Amer 32 (*)    All other components within normal limits  BASIC METABOLIC PANEL - Abnormal; Notable for the following:    Chloride 113 (*)    Glucose, Bld 114 (*)    BUN 32 (*)    Creatinine, Ser 2.18 (*)    Calcium 8.4 (*)    GFR calc non Af Amer 28 (*)    GFR calc Af Amer 32 (*)    All other components within normal limits  CBC - Abnormal; Notable for the following:    RBC 2.35 (*)    Hemoglobin 8.7 (*)    HCT 26.2 (*)    MCV 111.5 (*)    [name] 37.0 (*)  All other components within normal limits  CBC - Abnormal; Notable for the following:    RBC 2.20 (*)    Hemoglobin 8.1 (*)    HCT 24.6 (*)    MCV 111.8 (*)    [name] 36.8 (*)    All other components within normal limits  BASIC METABOLIC PANEL - Abnormal; Notable for the following:    Chloride 113 (*)    Glucose, Bld 110 (*)    BUN 34 (*)    Creatinine, Ser 2.20 (*)    Calcium 8.3 (*)    GFR calc non Af Amer 27 (*)    GFR calc Af Amer 32 (*)    All other components within normal limits  CBC - Abnormal; Notable for the following:    RBC 2.22 (*)     Hemoglobin 8.0 (*)    HCT 24.5 (*)    MCV 110.4 (*)    [name] 36.0 (*)    All other components within normal limits  CULTURE, EXPECTORATED SPUTUM-ASSESSMENT  CULTURE, BLOOD (ROUTINE X 2)  CULTURE, BLOOD (ROUTINE X 2)  CULTURE, RESPIRATORY (NON-EXPECTORATED)  I-STAT CG4 LACTIC ACID, ED    Imaging Review Likely PNA  I have personally reviewed and evaluated these images and lab results as part of my medical decision-making.   EKG Interpretation   Date/Time:  Saturday April 18 2015 12:15:03 EDT Ventricular Rate:  86 PR Interval:  186 QRS Duration: 80 QT Interval:  356 QTC Calculation: 426 R Axis:   65 Text Interpretation:  Sinus rhythm Anteroseptal infarct, old Minimal ST  elevation, inferior leads Sinus rhythm Artifact ST-t wave abnormality  Abnormal ekg Confirmed by Carmin Muskrat  MD ([id number]) on 04/18/2015  12:18:58 PM      MDM   Final diagnoses:  Pneumonia, organism unspecified   Patient w Wegeners p/w f/c, cough and XR concerning for CAP.  Patient required admission for further E/M.  Carmin Muskrat, MD 05/14/15 2124

## 2015-05-19 ENCOUNTER — Ambulatory Visit (INDEPENDENT_AMBULATORY_CARE_PROVIDER_SITE_OTHER)
Admission: RE | Admit: 2015-05-19 | Discharge: 2015-05-19 | Disposition: A | Discharge status: Home / Self Care | Payer: Medicare Other | Source: Ambulatory Visit | Attending: Family Medicine | Admitting: Family Medicine

## 2015-05-19 ENCOUNTER — Encounter (HOSPITAL_COMMUNITY)
Admission: RE | Admit: 2015-05-19 | Discharge: 2015-05-19 | Disposition: A | Discharge status: Home / Self Care | Payer: Medicare Other | Source: Ambulatory Visit | Attending: Nephrology | Admitting: Nephrology

## 2015-05-19 DIAGNOSIS — N183 Chronic kidney disease, stage 3 (moderate): Secondary | ICD-10-CM | POA: Diagnosis not present

## 2015-05-19 DIAGNOSIS — J189 Pneumonia, unspecified organism: Secondary | ICD-10-CM

## 2015-05-19 LAB — POCT HEMOGLOBIN-HEMACUE: Hemoglobin: 10.2 g/dL — ABNORMAL LOW (ref 13.0–17.0)

## 2015-05-19 MED ORDER — EPOETIN ALFA 20000 UNIT/ML IJ SOLN
INTRAMUSCULAR | Status: AC
  Administered 2015-05-19: 20000 [IU] via SUBCUTANEOUS
  Filled 2015-05-19: qty 1

## 2015-05-19 MED ORDER — EPOETIN ALFA 40000 UNIT/ML IJ SOLN: 30000.0000 [IU] | INTRAMUSCULAR | Status: DC

## 2015-05-19 MED ORDER — EPOETIN ALFA 10000 UNIT/ML IJ SOLN
INTRAMUSCULAR | Status: AC
  Administered 2015-05-19: 10000 [IU] via SUBCUTANEOUS
  Filled 2015-05-19: qty 1

## 2015-05-20 ENCOUNTER — Telehealth: Payer: Self-pay | Admitting: Family Medicine

## 2015-05-20 DIAGNOSIS — I10 Essential (primary) hypertension: Secondary | ICD-10-CM

## 2015-05-20 NOTE — Telephone Encounter (Signed)
Pt would like to know why he is being scheduled for the CT - per stacy @[place]  CT pt need to have lab work prior to CT bun/ Creatine   Pt scheduled for 05/28/2015 @9 ;00 am pt aware [place]  3rd  Floor [place] - pt need labs  Address: [address], [place], [place] [address]  [phone]

## 2015-05-20 NOTE — Addendum Note (Signed)
Addended by: Alysia Penna A on: 05/20/2015 09:26 AM   Modules accepted: Orders

## 2015-05-20 NOTE — Telephone Encounter (Signed)
I spoke with pt and he will schedule the lab appointment.

## 2015-05-20 NOTE — Telephone Encounter (Signed)
I ordered a BMET  

## 2015-05-20 NOTE — Telephone Encounter (Signed)
I spoke with pt and gave results from chest xray. Does he need to have labs done before CT?

## 2015-05-21 ENCOUNTER — Other Ambulatory Visit: Payer: Self-pay | Admitting: Family Medicine

## 2015-05-25 ENCOUNTER — Other Ambulatory Visit (INDEPENDENT_AMBULATORY_CARE_PROVIDER_SITE_OTHER): Payer: Medicare Other

## 2015-05-25 DIAGNOSIS — I10 Essential (primary) hypertension: Secondary | ICD-10-CM

## 2015-05-25 LAB — BASIC METABOLIC PANEL
BUN: 36 mg/dL — ABNORMAL HIGH (ref 6–23)
CALCIUM: 8.5 mg/dL (ref 8.4–10.5)
CHLORIDE: 110 meq/L (ref 96–112)
CO2: 27 meq/L (ref 19–32)
CREATININE: 2.46 mg/dL — AB (ref 0.40–1.50)
GFR: 27.26 mL/min — ABNORMAL LOW (ref >60.00)
GLUCOSE: 85 mg/dL (ref 70–99)
Potassium: 4.7 mEq/L (ref 3.5–5.1)
Sodium: 145 mEq/L (ref 135–145)

## 2015-05-26 ENCOUNTER — Telehealth: Payer: Self-pay | Admitting: Family Medicine

## 2015-05-26 ENCOUNTER — Encounter (HOSPITAL_COMMUNITY)
Admission: RE | Admit: 2015-05-26 | Discharge: 2015-05-26 | Disposition: A | Discharge status: Home / Self Care | Payer: Medicare Other | Source: Ambulatory Visit | Attending: Nephrology | Admitting: Nephrology

## 2015-05-26 DIAGNOSIS — N183 Chronic kidney disease, stage 3 (moderate): Secondary | ICD-10-CM | POA: Diagnosis not present

## 2015-05-26 LAB — POCT HEMOGLOBIN-HEMACUE: HEMOGLOBIN: 9.5 g/dL — AB (ref 13.0–17.0)

## 2015-05-26 MED ORDER — EPOETIN ALFA 40000 UNIT/ML IJ SOLN: 30000.0000 [IU] | INTRAMUSCULAR | Status: DC

## 2015-05-26 MED ORDER — EPOETIN ALFA 10000 UNIT/ML IJ SOLN
INTRAMUSCULAR | Status: AC
  Administered 2015-05-26: 10000 [IU] via SUBCUTANEOUS
  Filled 2015-05-26: qty 1

## 2015-05-26 MED ORDER — EPOETIN ALFA 20000 UNIT/ML IJ SOLN
INTRAMUSCULAR | Status: AC
  Administered 2015-05-26: 20000 [IU] via SUBCUTANEOUS
  Filled 2015-05-26: qty 1

## 2015-05-26 NOTE — Telephone Encounter (Signed)
Per Dr. Sarajane Jews okay to do CT without contrast. I spoke with Erline Levine in [place] and gave the okay.

## 2015-05-26 NOTE — Telephone Encounter (Signed)
[name] His CRE 2.46 and that's too high to give him IV contrast. If you want them to change to without contrast then she can change it or if you want to cancel it just let Erline Levine know.

## 2015-05-28 ENCOUNTER — Ambulatory Visit (INDEPENDENT_AMBULATORY_CARE_PROVIDER_SITE_OTHER)
Admission: RE | Admit: 2015-05-28 | Discharge: 2015-05-28 | Disposition: A | Discharge status: Home / Self Care | Payer: Medicare Other | Source: Ambulatory Visit | Attending: Family Medicine | Admitting: Family Medicine

## 2015-05-28 ENCOUNTER — Other Ambulatory Visit: Payer: Medicare Other

## 2015-05-28 DIAGNOSIS — R938 Abnormal findings on diagnostic imaging of other specified body structures: Secondary | ICD-10-CM

## 2015-05-28 DIAGNOSIS — R9389 Abnormal findings on diagnostic imaging of other specified body structures: Secondary | ICD-10-CM

## 2015-06-01 ENCOUNTER — Other Ambulatory Visit: Payer: Self-pay | Admitting: Internal Medicine

## 2015-06-02 ENCOUNTER — Inpatient Hospital Stay (HOSPITAL_COMMUNITY): Admission: RE | Admit: 2015-06-02 | Payer: Medicare Other | Source: Ambulatory Visit

## 2015-06-02 ENCOUNTER — Encounter (HOSPITAL_COMMUNITY)
Admission: RE | Admit: 2015-06-02 | Discharge: 2015-06-02 | Disposition: A | Discharge status: Home / Self Care | Payer: Medicare Other | Source: Ambulatory Visit | Attending: Nephrology | Admitting: Nephrology

## 2015-06-02 DIAGNOSIS — N183 Chronic kidney disease, stage 3 (moderate): Secondary | ICD-10-CM | POA: Diagnosis not present

## 2015-06-02 LAB — POCT HEMOGLOBIN-HEMACUE: Hemoglobin: 10.2 g/dL — ABNORMAL LOW (ref 13.0–17.0)

## 2015-06-02 LAB — IRON AND TIBC
IRON: 48 ug/dL (ref 45–182)
Saturation Ratios: 23 % (ref 17.9–39.5)
TIBC: 204 ug/dL — ABNORMAL LOW (ref 250–450)
UIBC: 156 ug/dL

## 2015-06-02 LAB — FERRITIN: Ferritin: 271 ng/mL (ref 24–336)

## 2015-06-02 MED ORDER — EPOETIN ALFA 10000 UNIT/ML IJ SOLN
INTRAMUSCULAR | Status: AC
  Administered 2015-06-02: 10000 [IU] via SUBCUTANEOUS
  Filled 2015-06-02: qty 1

## 2015-06-02 MED ORDER — EPOETIN ALFA 20000 UNIT/ML IJ SOLN
INTRAMUSCULAR | Status: AC
  Administered 2015-06-02: 20000 [IU] via SUBCUTANEOUS
  Filled 2015-06-02: qty 1

## 2015-06-02 MED ORDER — EPOETIN ALFA 40000 UNIT/ML IJ SOLN: 30000.0000 [IU] | INTRAMUSCULAR | Status: DC

## 2015-06-04 ENCOUNTER — Telehealth: Payer: Self-pay | Admitting: Internal Medicine

## 2015-06-04 NOTE — Telephone Encounter (Signed)
Attempted to call pt. No answer. Will try to call back.

## 2015-06-05 MED ORDER — CIPROFLOXACIN HCL 750 MG PO TABS: 750.0000 mg | ORAL_TABLET | Freq: Two times a day (BID) | ORAL | Status: DC

## 2015-06-05 NOTE — Telephone Encounter (Signed)
cipro 750 bid x one week

## 2015-06-05 NOTE — Telephone Encounter (Signed)
Spoke with pt and is aware of recs. RX sent in. Nothing further needed 

## 2015-06-05 NOTE — Telephone Encounter (Signed)
Spoke with pt, aware of below. Pt states he has had a prod cough with yellow mucus X1 week.  Denies fever, chest pain.  Pt requesting levaquin or cipro.   Pt uses [facility] on [address]   Please leave detailed message if pt does not answer.  MW please advise.  Thanks.

## 2015-06-05 NOTE — Telephone Encounter (Signed)
No surprises,no change in rx  We'll discuss in more detail on f/u ov

## 2015-06-05 NOTE — Telephone Encounter (Signed)
Pt had CT done 10/20 by Dr. Sarajane Jews. Pt wants MW to look at this. Pt has f/u appt 07/08/15. Please advise thanks

## 2015-06-08 ENCOUNTER — Other Ambulatory Visit (HOSPITAL_COMMUNITY): Payer: Self-pay | Admitting: *Deleted

## 2015-06-09 ENCOUNTER — Encounter (HOSPITAL_COMMUNITY)
Admission: RE | Admit: 2015-06-09 | Discharge: 2015-06-09 | Disposition: A | Discharge status: Home / Self Care | Payer: Medicare Other | Source: Ambulatory Visit | Attending: Nephrology | Admitting: Nephrology

## 2015-06-09 DIAGNOSIS — D631 Anemia in chronic kidney disease: Secondary | ICD-10-CM | POA: Diagnosis not present

## 2015-06-09 DIAGNOSIS — Z79899 Other long term (current) drug therapy: Secondary | ICD-10-CM | POA: Diagnosis not present

## 2015-06-09 DIAGNOSIS — N183 Chronic kidney disease, stage 3 (moderate): Secondary | ICD-10-CM | POA: Insufficient documentation

## 2015-06-09 DIAGNOSIS — Z5181 Encounter for therapeutic drug level monitoring: Secondary | ICD-10-CM | POA: Insufficient documentation

## 2015-06-09 LAB — RENAL FUNCTION PANEL
Albumin: 3.1 g/dL — ABNORMAL LOW (ref 3.5–5.0)
Anion gap: 6 (ref 5–15)
BUN: 24 mg/dL — ABNORMAL HIGH (ref 6–20)
CALCIUM: 8.7 mg/dL — AB (ref 8.9–10.3)
CO2: 27 mmol/L (ref 22–32)
Chloride: 107 mmol/L (ref 101–111)
Creatinine, Ser: 2.15 mg/dL — ABNORMAL HIGH (ref 0.61–1.24)
GFR calc non Af Amer: 28 mL/min — ABNORMAL LOW (ref >60)
GFR, EST AFRICAN AMERICAN: 33 mL/min — AB (ref >60)
GLUCOSE: 119 mg/dL — AB (ref 65–99)
Phosphorus: 3.6 mg/dL (ref 2.5–4.6)
Potassium: 4.5 mmol/L (ref 3.5–5.1)
SODIUM: 140 mmol/L (ref 135–145)

## 2015-06-09 LAB — POCT HEMOGLOBIN-HEMACUE: Hemoglobin: 10.3 g/dL — ABNORMAL LOW (ref 13.0–17.0)

## 2015-06-09 MED ORDER — EPOETIN ALFA 10000 UNIT/ML IJ SOLN
INTRAMUSCULAR | Status: AC
  Administered 2015-06-09: 10000 [IU]
  Filled 2015-06-09: qty 1

## 2015-06-09 MED ORDER — EPOETIN ALFA 20000 UNIT/ML IJ SOLN
INTRAMUSCULAR | Status: AC
  Administered 2015-06-09: 20000 [IU]
  Filled 2015-06-09: qty 1

## 2015-06-09 MED ORDER — EPOETIN ALFA 40000 UNIT/ML IJ SOLN: 30000.0000 [IU] | INTRAMUSCULAR | Status: DC

## 2015-06-09 MED ORDER — FERUMOXYTOL INJECTION 510 MG/17 ML
510.0000 mg | Freq: Once | INTRAVENOUS | Status: AC
  Administered 2015-06-09: 510 mg via INTRAVENOUS
  Filled 2015-06-09: qty 17

## 2015-06-10 LAB — PTH, INTACT AND CALCIUM
CALCIUM TOTAL (PTH): 8.7 mg/dL (ref 8.6–10.2)
PTH: 66 pg/mL — ABNORMAL HIGH (ref 15–65)

## 2015-06-16 ENCOUNTER — Encounter (HOSPITAL_COMMUNITY)
Admission: RE | Admit: 2015-06-16 | Discharge: 2015-06-16 | Disposition: A | Discharge status: Home / Self Care | Payer: Medicare Other | Source: Ambulatory Visit | Attending: Nephrology | Admitting: Nephrology

## 2015-06-16 DIAGNOSIS — N183 Chronic kidney disease, stage 3 (moderate): Secondary | ICD-10-CM | POA: Diagnosis not present

## 2015-06-16 LAB — POCT HEMOGLOBIN-HEMACUE: Hemoglobin: 11.5 g/dL — ABNORMAL LOW (ref 13.0–17.0)

## 2015-06-16 MED ORDER — EPOETIN ALFA 10000 UNIT/ML IJ SOLN
INTRAMUSCULAR | Status: AC
  Administered 2015-06-16: 10000 [IU] via SUBCUTANEOUS
  Filled 2015-06-16: qty 1

## 2015-06-16 MED ORDER — EPOETIN ALFA 40000 UNIT/ML IJ SOLN: 30000.0000 [IU] | INTRAMUSCULAR | Status: DC

## 2015-06-16 MED ORDER — EPOETIN ALFA 20000 UNIT/ML IJ SOLN
INTRAMUSCULAR | Status: AC
  Administered 2015-06-16: 20000 [IU] via SUBCUTANEOUS
  Filled 2015-06-16: qty 1

## 2015-06-19 ENCOUNTER — Other Ambulatory Visit: Payer: Self-pay | Admitting: Internal Medicine

## 2015-06-24 ENCOUNTER — Encounter (HOSPITAL_COMMUNITY)
Admission: RE | Admit: 2015-06-24 | Discharge: 2015-06-24 | Disposition: A | Discharge status: Home / Self Care | Payer: Medicare Other | Source: Ambulatory Visit | Attending: Nephrology | Admitting: Nephrology

## 2015-06-24 DIAGNOSIS — N183 Chronic kidney disease, stage 3 (moderate): Secondary | ICD-10-CM | POA: Diagnosis not present

## 2015-06-24 LAB — POCT HEMOGLOBIN-HEMACUE: HEMOGLOBIN: 11.2 g/dL — AB (ref 13.0–17.0)

## 2015-06-24 MED ORDER — EPOETIN ALFA 40000 UNIT/ML IJ SOLN: 30000.0000 [IU] | INTRAMUSCULAR | Status: DC

## 2015-06-24 MED ORDER — EPOETIN ALFA 20000 UNIT/ML IJ SOLN
INTRAMUSCULAR | Status: AC
  Administered 2015-06-24: 20000 [IU] via SUBCUTANEOUS
  Filled 2015-06-24: qty 1

## 2015-06-24 MED ORDER — EPOETIN ALFA 10000 UNIT/ML IJ SOLN
INTRAMUSCULAR | Status: AC
  Administered 2015-06-24: 10000 [IU] via SUBCUTANEOUS
  Filled 2015-06-24: qty 1

## 2015-06-25 ENCOUNTER — Other Ambulatory Visit: Payer: Self-pay | Admitting: Internal Medicine

## 2015-06-26 ENCOUNTER — Telehealth: Payer: Self-pay | Admitting: Internal Medicine

## 2015-06-26 MED ORDER — LEVOFLOXACIN 750 MG PO TABS: ORAL_TABLET | ORAL | Status: DC

## 2015-06-26 NOTE — Telephone Encounter (Signed)
Called and spoke with pt and informed of approved refill from [facility] verified with pt  Refill sent electronically to pharmacy  Nothing further is needed

## 2015-06-26 NOTE — Telephone Encounter (Signed)
Patient calling to get refill on Levaquin.  Patient says that Dr. [name] usually gives him refills so he can have it filled whenever he gets yellow mucus.    Dr. [name], ok to refill Levaquin?  If so, do you want patient to have extra refills?  Please advise.

## 2015-06-26 NOTE — Telephone Encounter (Signed)
Yes, whatever dose/amt we've been giving is good with 6 refills

## 2015-06-30 ENCOUNTER — Encounter (HOSPITAL_COMMUNITY)
Admission: RE | Admit: 2015-06-30 | Discharge: 2015-06-30 | Disposition: A | Discharge status: Home / Self Care | Payer: Medicare Other | Source: Ambulatory Visit | Attending: Nephrology | Admitting: Nephrology

## 2015-06-30 DIAGNOSIS — N183 Chronic kidney disease, stage 3 (moderate): Secondary | ICD-10-CM | POA: Diagnosis not present

## 2015-06-30 LAB — IRON AND TIBC
IRON: 27 ug/dL — AB (ref 45–182)
Saturation Ratios: 17 % — ABNORMAL LOW (ref 17.9–39.5)
TIBC: 162 ug/dL — ABNORMAL LOW (ref 250–450)
UIBC: 135 ug/dL

## 2015-06-30 LAB — FERRITIN: FERRITIN: 588 ng/mL — AB (ref 24–336)

## 2015-06-30 LAB — POCT HEMOGLOBIN-HEMACUE: HEMOGLOBIN: 10.6 g/dL — AB (ref 13.0–17.0)

## 2015-06-30 MED ORDER — EPOETIN ALFA 10000 UNIT/ML IJ SOLN
INTRAMUSCULAR | Status: AC
  Administered 2015-06-30: 10000 [IU]
  Filled 2015-06-30: qty 1

## 2015-06-30 MED ORDER — EPOETIN ALFA 20000 UNIT/ML IJ SOLN
INTRAMUSCULAR | Status: AC
  Administered 2015-06-30: 20000 [IU]
  Filled 2015-06-30: qty 1

## 2015-06-30 MED ORDER — EPOETIN ALFA 40000 UNIT/ML IJ SOLN: 30000.0000 [IU] | INTRAMUSCULAR | Status: DC

## 2015-07-06 ENCOUNTER — Other Ambulatory Visit (HOSPITAL_COMMUNITY): Payer: Self-pay | Admitting: *Deleted

## 2015-07-07 ENCOUNTER — Encounter (HOSPITAL_COMMUNITY)
Admission: RE | Admit: 2015-07-07 | Discharge: 2015-07-07 | Disposition: A | Discharge status: Home / Self Care | Payer: Medicare Other | Source: Ambulatory Visit | Attending: Nephrology | Admitting: Nephrology

## 2015-07-07 DIAGNOSIS — N183 Chronic kidney disease, stage 3 (moderate): Secondary | ICD-10-CM | POA: Diagnosis not present

## 2015-07-07 LAB — POCT HEMOGLOBIN-HEMACUE: Hemoglobin: 10.7 g/dL — ABNORMAL LOW (ref 13.0–17.0)

## 2015-07-07 MED ORDER — EPOETIN ALFA 40000 UNIT/ML IJ SOLN: 30000.0000 [IU] | INTRAMUSCULAR | Status: DC

## 2015-07-07 MED ORDER — SODIUM CHLORIDE 0.9 % IV SOLN
510.0000 mg | Freq: Once | INTRAVENOUS | Status: AC
  Administered 2015-07-07: 510 mg via INTRAVENOUS
  Filled 2015-07-07: qty 17

## 2015-07-07 MED ORDER — EPOETIN ALFA 10000 UNIT/ML IJ SOLN
INTRAMUSCULAR | Status: AC
  Administered 2015-07-07: 10000 [IU]
  Filled 2015-07-07: qty 1

## 2015-07-07 MED ORDER — EPOETIN ALFA 20000 UNIT/ML IJ SOLN
INTRAMUSCULAR | Status: AC
  Administered 2015-07-07: 20000 [IU]
  Filled 2015-07-07: qty 1

## 2015-07-08 ENCOUNTER — Ambulatory Visit (INDEPENDENT_AMBULATORY_CARE_PROVIDER_SITE_OTHER): Payer: Medicare Other | Admitting: Internal Medicine

## 2015-07-08 ENCOUNTER — Encounter: Payer: Self-pay | Admitting: Internal Medicine

## 2015-07-08 VITALS — BP 112/78 | HR 83 | Ht 67.0 in | Wt 119.4 lb

## 2015-07-08 DIAGNOSIS — J479 Bronchiectasis, uncomplicated: Secondary | ICD-10-CM

## 2015-07-08 DIAGNOSIS — M3131 Wegener's granulomatosis with renal involvement: Secondary | ICD-10-CM

## 2015-07-08 NOTE — Progress Notes (Signed)
Subjective:    Patient ID: Richard [name], male    DOB: 07-24-1936     MRN: [id number]   Brief patient profile:  31  yowm never smoker with dx of Longstanding bronchiectasis then dx with WG 03/2010 > requested establish with Betsi Crespi.   Admit [facility] dx WG by pos c anca 03/2010, supportive tbbx but not vats   Admit [name] dx spont L Ptx 9/08/15/09 requiring Chest tube but no surgery   05/24/10--NP ov/ med reivew. Since last visit. has been hospitalized 05/12/2010-- 10/10/2011for Right pneumothorax., with underlying Wegener granulomatosis. and . Bronchiectasis. Pt was found to have Right Pneumo. Chest tube was placed. with improvement and removed 10/7. Xray on 10/10 w/ resolved pnuemo. He was continued on cytoxan and bactrim for PCP prophylaxis. rec. Prednisone 20 mg reduce to one half daily  Aspirin 81 mg one daily with breakfast but stop for any bleeding  See Patient Care Coordinator before leaving for cardiology appt > saw [name], ? LHC needed       11/23/2012 f/u ov/Windy Dudek re WG/ bronchiectasis Chief Complaint  Patient presents with  . Follow-up    Had PFT today-sob same,cough-yellow,wheezing occass.,  no hemoptysis, no real limiting sob though sedentary, no arthralgias or rash maintaining 2.5 mg daily No change prednisone 5 mg one half daily  > ok to try every other day if doing great May 1 flare rx levaquin and pred May 16 lap chole [facility]     08/19/2013 f/u ov/Raylyn Speckman re: ? Recurrent WG Chief Complaint  Patient presents with  . Follow-up    Breathing is unchanged. Cough unchanged-prod cpugh w/ yellow phlem. On 2nd dose of cipro  now on day 5/10 cipro and less dark, never bloody, no sinus complaints, arthritis rash or fever or hematuria. >ANCA 1:80 titer, started on cytoxan        09/27/2013 f/u ov/Sofie Schendel re:  WG on pred 10 and ctx 50 mg daily  Chief Complaint  Patient presents with  . Follow-up    Pt states cough and SOB seems worse since the last visit. Still producing yellow  sputum. He states has low grade temp every am.   assoc with nasal congestion but no epistaxis or hemoptysis  rec Stop Zebeta Schedule sinus CT> 2/09/14/13 Mild to moderate mucosal thickening in the paranasal sinuses without  air-fluid level. For cough use the mucinex dm up to 1200 mg every 12 hours and as much flutter valve as possible For breathing use xopenex up to 2 puffs every 4 hours if needed and don't leave home wihout it   Admit date: 10/04/2013  Discharge date: 10/07/2013  Discharge Diagnoses:  SOB (shortness of breath)  Wegener's granulomatosis  Bronchiectasis with acute exacerbation  SVT (supraventricular tachycardia)  Cough  Weakness generalized  Discharge Condition: Stable and improved  Filed Weights    10/04/13 1539   Weight:  58.423 kg (128 lb 12.8 oz)   History of present illness:  Patient is a pleasant 80 year old white man with a past medical history significant for Wegener's granulomatosis diagnosed in 2011 was on Cytoxan until 2013. He follows with Dr. [name]. He is maintained on prednisone 10 mg daily. He had been feeling well until mid January at which point a chest x-ray showed increased nodules and Dr. [name] decided to restart Cytoxan. Patient came into the hospital with the above-mentioned complaints. His heart rate was noted to be in the 180s in the rhythm of SVT. He converted to sinus rhythm spontaneously. CT scan of  the chest shows bronchiectasis as well as numerous cavitary and non-cavitary nodules. We have been asked to admit him for further evaluation and management. In addition to his cough and mild shortness of breath, he endorses low-grade temperatures for about 2 weeks as well as increasing sinus symptoms.  Hospital Course:  Wegener's Granulomatosis/Bronchiectasis with Acute Exacerbation  -Cx data remains negative to date.  -As discussed with Dr. [name], will transition antibiotics to cipro for 3 more days, continue cytoxan (bactrim while on cytoxan for PCP  prophylaxis), and slow steroid taper until seen again by Dr. [name].  SVT  -Spontaneously converted to NSR.          12/06/13 to ER with dehyrdration   01/09/2014 f/u ov/Gilberto Stanforth re:  Chief Complaint  Patient presents with  . Follow-up    Pt c/o non prod cough and states that his breathing is no better. He c/o poor appetite.   rec If mucus gets nasty,  Levaquin 750 x 7 days If mucus gets thick >  mucinex dm 1200 mg every 12 h and use the flutter valve   02/12/2014 f/u ov/Brittan Butterbaugh re: bronchiectasis / pred 10 / cyt 2 Chief Complaint  Patient presents with  . Follow-up    Pt states that his symptoms are unchanged since last visit. Finished round of levaquin x 2 days ago.  No new co's today.   ok at [employer] leaning on the cart  New occ chest discomfort when sob x sev months, goes away at rest. No assoc diaph, nausea, radiation Has never tried xopenex for this symptom to date, encouraged to do so rec Please see patient coordinator before you leave today  to schedule VEST Tdap today      Patient ID: Richard [name]  MRN: [id number], DOB/AGE: 05-Nov-1935 79 y.o.  Admit date: 03/13/2014  D/C date: 03/26/2014   Primary Cardiologist: Dr. Debara Pickett  Principal Problem:  Unstable Class III Angina  Active Problems:  HYPOTHYROIDISM  HYPERLIPIDEMIA  ANEMIA  Wegener's granulomatosis  Chronic respiratory failure  Abnormal nuclear stress test: Intermediate risk with moderate region of apical and inferior scar with mild superimposed ischemia; EF 40%  CAD- severe 4 V CAD- not CABG candidate  Anemia- transfused 03/16/14  Chronic renal insufficiency, stage III (moderate)  Cardiomyopathy, ischemic- EF 40% by Myoview  Acute blood loss anemia  Femoral artery pseudo-aneurysm, right  Admission Dates: 03/13/14 - 03/26/14  Discharge Diagnosis: [place] s/p [facility] x3 and ultimately BMS to pLAD& POBA to CTO of circumflex/marginal vessel on 03/17/14 and staged BMS to mRCA on 03/21/14  HPI: Richard [name] is a 79 y.o. male  with a history of hypothyroidism, HLD, anemia, Wegener's granulomatosis and no prior cardiac disease who presented to [facility] ED on 03/13/14 with chest pain and was transferred to [facility] for cath the following morning.  PMH significant for Wegener's granulomatosis and follows with Dr. [name] for his pulmonary disease (severe multifocal bronchiectasis as well as numerous cavitary and non-cavitary nodules). He had been having chest tightness with significant exertion for the last few months with increasing dyspnea on exertion. Also had 2 brothers who both had CABG. Referred to Dr. Debara Pickett who proceeded with nuclear stress testing which demonstrated a "moderate region of apical and inferior scar with mild superimposed qualitative ischemia". LV wall motion demonstrated a "moderate degree of hypokinesis involving the mid-distal inferior wall and apex with an EF of 40%". Prior echo dated 03/30/2010 showed EF 45-50%.  Hospital Course: He presented to [facility] ED with increasing fatigue  and chest tightness which developed that morning, which resolved within 10 minutes.  He was given ASA, heparin, and nitro paste. ECG demonstrated sinus tachycardia with no acute ST-T abnormalities. POC troponin normal. Also noted to be anemic, Hgb 9 (10 on 12/06/13). Dr. [name] noted that Mr. Mayhall has had hemoptysis in the past, therefore limiting DAPT was ideal and BMS over DES thought to be the best option.  [place]/CAD:  -- Abnormal outpatient nuclear stress test on 03/12/14: Intermediate risk with moderate region of apical and inferior scar with mild superimposed ischemia; EF 40%  -- s/p first LHC on 03/14/14 which revealed severe multivessel CAD involving all 4 major branches. Seen by Dr. Madolyn Frieze on this admission who deemed him unsuitable for CABG due to underlying comorbid conditions. ( sts score >30). It was elected to proceed with staged multivessel PCI .  -- s/p LHC on 03/17/14 with successful PCI to the LAD and POBA to a chronically occluded LCX. There  remained high grade proximal disease to a large proximal Ramus branch as well as mid-RCA disease so staged PCI was planned at a later date due to pulmonary issues and renal dysfunction. This was further complicated by right femoral artery pseudoaneurysm requiring repair. LHC on 03/21/14 s/p PCI with rotablator atherectomy with BMS to the mid RCA. Due to length of fluoro time and large volume of contrast, it was decided to medically manage the ramus stenosis which Dr. Angelena Form felt was moderately stenosed and if he has recurrent anginal then consider PCI.  -- Continue ASA/BB/Plavix/statin  Right femoral pseudoaneurysm: s/p suture repair of femoral artery with evacuation of hematoma on 03/19/14. Still with significant hematoma down buttox and hamstring but improving. CBC stable at 7.7.  -- Has follow up with Dr. Kellie Simmering on 04/08/2014  Anemia: subsequent to right femoral pseudoaneurysm- s/p surgical repair. S/p blood transfusion x 2. Hgb improved from 7.4 to 8.6. Stable at 7.7 on discharge.  --Will send home on Iron 324 mg daily with colace 100 mg BID to help prevent constipation  SOB - BNP elevated at 4937. D-Dimer elevated at 2.6. CTA with no acute PE. No s/s of volume overload  -- Though to be possibly due to anemia. Chest xray showed multiple cavitary lesions but no CHF. CTA with multiple abnormalities possibly reflecting acute bronchitis vs organizing PNA. PCCM (followed by Dr. [name]) consulted who felt that the CT was at his baseline and to continue current therapy. He thought his SOB was likely multifactorial and anemia also playing a role.  -- BNP elevated at 4937. D-Dimer elevated at 2.6. CTA with no acute PE. No s/s of volume overload  Chronic systolic CHF- 2 D ECHO with LVEF 40-45%, mid to distal inferoapical and apical hypokinesis, diastolic dysfunction with elevated LV filling pressure, mild to moderate RV hypokinesis.  Wegener's granulomatosis. ( per PCCM)  -- Continue cytoxan, prednisone  --  Bactrim for PCP prophylaxis  -- Will need pulmonary rehab as an outpatient  Chronic renal insufficiency, stage III (moderate).  -- Creat elevated but stable after contrast exposure for CTA  -- Creat stable at 1.48 today. Continue to monitor  The patient has had a prolonged hospital course but is recovering well. The femoral catheter site is stable. He has been seen by Dr. Irish Lack today and deemed ready for discharge home. All follow-up appointments have been scheduled. Discharge medications are listed below.     04/11/2014 ext post hosp  f/u ov/Carrol Hougland re: bronchiectasis / angina resolved p stent Chief Complaint  Patient presents with  . Follow-up    couging yellowish   Not using vest yet due to concerns with stent and plans on talking to cards first. cipro best recent rx for purulent sputum but doesn't have a prn on hand  maint on pred 10 mg per day and cytoxan 50  rec Cipro 500 mg twice daily x 10 days as needed for nasty mucus    05/23/2014 f/u ov/Trenita Hulme re: WG on cytoxan 50/ pred 10 with CRI f/u by nephrology  Chief Complaint  Patient presents with  . Follow-up    Wegener's granulomatosis; SOB, cough, spitting up yellow sputum, finished Levaquin 1 week ago  feels levaquin may work a little better than cipro No need for saba Not limited by breathing from desired activities  As by gen weakness  No hemoptysis   rec No change in prednisone or cytoxan dosing Have your kidney doctor send me the results of your labs if not in the [facility] ([facility]) Please remember to go to the  x-ray department downstairs for your tests    08/19/2014 acute  ov/Ann-Marie Kluge re: ? pna vs flare of bronchiectasis  Chief Complaint  Patient presents with  . Acute Visit    Pt c/o chest congestion that started on 08/16/14- started on levaquin and then next day started having CP.  He states that his breathing seems to be slightly worse than usual for him.   one week acute onset  sense of building up fluid/ congestion in  chest" just like many times before "with change in sputum color ? Red ? Started levaquin 08/16/14 then new pattern of cp 1/11 = moderate/ severe gen bilateral  Ant worse with deep breathing or  coughing or exp to cold air   Did not try xopenex or ntg/ stopped levaquin and pain resolved Pain was like a tightness s  radiation or assoc sweathing nausea or vomiting  rec Increase prednisone to 15 mg per day until better then back to 7.5 mg daily  Augmentin 875 mg take one pill twice daily  X 10 days - take at breakfast and supper with large glass of water.  It would help reduce the usual side effects (diarrhea and yeast infections) if you ate cultured yogurt at lunch.  For breathing/coughing/ congestion >  Up to 2 puff every 4 hours as needed  Make sure you are taking pepcid 20 mg after bfast and supper     09/02/2014 Follow up : Bronchiectasis/NP Patient returns for a two-week follow-up. Last visit. Patient with a bronchiectatic exacerbation. He was treated with Augmentin 10 days and a prednisone burst. Patient has started to slowly feel improved with decreased discolored mucus. Shortness of breath is not quite as bad. He has made very slow progress. He denies any hemoptysis, orthopnea, PND, leg swelling or calf pain. Appetite is fair. No nausea, vomiting or diarrhea. Chest x-ray last visit showed chronic changes rec Continue on current regimen  Continue with VEST therapy.     10/30/2014 f/u ov/Camden Knotek re: bronchiectasis/AB Chief Complaint  Patient presents with  . Follow-up    Pt c/o increased SOB and chest congestion for the past 5 days. Cough is prod with yellow sputum.     rx with levaquin x 2 days but only used saba twice ? How much can you use it if you are having a bad day with your breathing A Not sure Baseline = sob x more than slow adls rec Increase prednisone to 15 mg per  day until better then back to 7.5 mg daily  Augmentin 875 mg take one pill twice daily  X 10 days - take at  breakfast and supper with large glass of water.  It would help reduce the usual side effects (diarrhea and yeast infections) if you ate cultured yogurt at lunch.  For breathing/coughing/ congestion >  Up to 2 puff every 4 hours as needed  Make sure you are taking pepcid 20 mg after bfast and supper  See Tammy NP w/in 2 weeks with all your medications > did not do     07/08/2015  f/u ov/Venezia Sargeant re: bronchiectasis/ on pred 7.5 mg daily / 50 mg daily cytoxan per renal  Chief Complaint  Patient presents with  . Follow-up    Pt c/o continued dyspnea with exertion and wet cough with yellow mucus. Pt denies wheeze/CP/tightness.   not using flutter / not understanding contingencies listed on his home made med calendar but no longer using  maint on sym 160 2bid with baseline doe = MMRC1 = can walk nl pace, flat grade, can't hurry or go uphills or steps s sob  Not sure vest helping and not using as consistently      No obvious day to day or daytime variabilty or assoc increase in   cp or chest tightness, subjective wheeze overt sinus or hb symptoms. No unusual exp hx or h/o childhood pna/ asthma or knowledge of premature birth.  Sleeping ok without nocturnal  or early am exacerbation  of respiratory  c/o's or need for noct saba. Also denies any obvious fluctuation of symptoms with weather or environmental changes or other aggravating or alleviating factors except as outlined above   Current Medications, Allergies, Complete Past Medical History, Past Surgical History, Family History, and Social History were reviewed in [employer] record.  ROS  The following are not active complaints unless bolded sore throat, dysphagia, dental problems, itching, sneezing,  nasal congestion or excess/ purulent secretions, ear ache,   fever, chills, sweats, unintended wt loss, pleuritic or exertional cp, hemoptysis,  orthopnea pnd or leg swelling, presyncope, palpitations, heartburn, abdominal pain,  anorexia, nausea, vomiting, diarrhea  or change in bowel or urinary habits, change in stools or urine, dysuria,hematuria,  rash, arthralgias, visual complaints, headache, numbness weakness or ataxia or problems with walking or coordination,  change in mood/affect or memory.         Objective:   Physical Exam  wt   128  04/26/11 >07/08/2011  134> 134 11/28/2011 > 02/29/2012 136> 05/31/2012  136>  134 11/22/12 > 07/26/2013  136 > 08/21/2013  136 >133 09/06/2013 >  09/12/2013  133 > 132 09/27/2013 > 10/15/2013 131 > 11/07/2013  128 > 01/09/14 124 > 02/12/2014  123 > 04/11/2014   117 > 05/23/2014 119 > 08/19/2014 110 > 10/30/2014  120 >  04/09/2015 120 > 07/08/2015  119   Vital signs reviewed   amb thin pleasant wm nad  SKIN: no rash, lesions  NODES: no lymphadenopathy  HEENT: [place]/AT, EOM- WNL, Conjuctivae- clear, PERRLA, TM-WNL, Nose- clear, Throat- clear and wnl, Mallampati III  NECK: Supple w/ fair ROM, JVD- none, normal carotid impulses w/o bruits Thyroid-  CHEST: minimal exp rhonchi bilaterally  No resp distress HEART: RRR, no m/g/r heard  ABDOMEN: Soft and nl EXT:  No deformities or restrucitons      I personally reviewed images and agree with radiology impression as follows:  CXR: 04/09/2015 1. Stable hyperinflation and parenchymal fibrotic changes.  There is no acute cardiopulmonary abnormality. 2. Moderate-sized hiatal hernia.    Labs reviewed Lab Results  Component Value Date   WBC 5.8 03/26/2015   HGB 8.3* 04/07/2015   HCT 24.5* 03/26/2015   MCV 111.9* 03/26/2015   PLT 232 03/26/2015       Chemistry      Component Value Date/Time   NA 142 03/24/2015 0919   K 4.1 03/24/2015 0919   CL 107 03/24/2015 0919   CO2 23 03/24/2015 0919   BUN 38* 03/24/2015 0919   CREATININE 1.92* 03/24/2015 0919   CREATININE 2.35* 07/16/2014 0540      Component Value Date/Time   CALCIUM 7.9* 03/24/2015 0919   ALKPHOS 82 03/24/2015 0919   AST 12 03/24/2015 0919   ALT 8* 03/24/2015 0919   BILITOT 0.4  03/24/2015 0919        I personally reviewed images and agree with radiology impression as follows:  CT Chest   05/29/15 1. Overall similar in severity and distribution of upper lobe peribronchovascular interstitial opacities, diffuse bronchiectasis, and mucous filled bronchi. Given the concurrent hiatal hernia, chronic aspiration is a consideration. Chronic atypical infection could look similar. 2. A right lower lobe irregular opacity is favored to relate to this infectious or inflammatory process. Given absence on prior exams, recommend chest CT follow-up at 3 months after appropriate antibiotic therapy and clinical exclusion of aspiration. 3. Atherosclerosis, including within the coronary arteries. 4. Decrease in trace right pleural fluid.      Assessment & Plan:

## 2015-07-08 NOTE — Patient Instructions (Addendum)
Levaquin 750 mg  Daily x 5-7 days when mucus gets nasty   Use the flutter valve just as much as you can  Please schedule a follow up visit in 3 months but call sooner if needed

## 2015-07-09 ENCOUNTER — Encounter: Payer: Self-pay | Admitting: Internal Medicine

## 2015-07-09 NOTE — Assessment & Plan Note (Signed)
-    HFA 90% p coaching 12/16/2010   -   PFT's 06/21/11   FEV1  1.60 (61%) ratio 48 and 10% better p B2,  DLCO 83%  - CT 10/04/13 severe multifocal bronchiectasis as well as numerous cavitary and      non-cavitary nodules  -   PFT's 11/22/2012 FEV1  1.52 (62%) arnd 46 and no change p B2 DLCO 89%  -    VEST rec 02/12/2014 > reported improvement 05/23/14    - 07/08/2015  extensive coaching HFA effectiveness =  90%     Becoming more confused with meds from multiple providers, no longer using med calendar and did not return as requqested prev for formal med reconciliation where we separate the main from the prns and list the latter by the problem, not the name of the med   I had an extended discussion with the patient reviewing all relevant studies completed to date and  lasting 15 to 20 minutes of a 25 minute visit    Each maintenance medication was reviewed in detail including most importantly the difference between maintenance and prns and under what circumstances the prns are to be triggered using an action plan format that is not reflected in the computer generated alphabetically organized AVS.    Please see instructions for details which were reviewed in writing and the patient given a copy highlighting the part that I personally wrote and discussed at today's ov.

## 2015-07-09 NOTE — Assessment & Plan Note (Signed)
Defer all titration of pred and cytoxan to renal at this point

## 2015-07-14 ENCOUNTER — Encounter (HOSPITAL_COMMUNITY)
Admission: RE | Admit: 2015-07-14 | Discharge: 2015-07-14 | Disposition: A | Discharge status: Home / Self Care | Payer: Medicare Other | Source: Ambulatory Visit | Attending: Nephrology | Admitting: Nephrology

## 2015-07-14 ENCOUNTER — Other Ambulatory Visit: Payer: Self-pay | Admitting: Internal Medicine

## 2015-07-14 DIAGNOSIS — D631 Anemia in chronic kidney disease: Secondary | ICD-10-CM | POA: Diagnosis not present

## 2015-07-14 DIAGNOSIS — N183 Chronic kidney disease, stage 3 (moderate): Secondary | ICD-10-CM | POA: Diagnosis not present

## 2015-07-14 DIAGNOSIS — Z79899 Other long term (current) drug therapy: Secondary | ICD-10-CM | POA: Insufficient documentation

## 2015-07-14 DIAGNOSIS — Z5181 Encounter for therapeutic drug level monitoring: Secondary | ICD-10-CM | POA: Diagnosis not present

## 2015-07-14 LAB — POCT HEMOGLOBIN-HEMACUE: Hemoglobin: 10.7 g/dL — ABNORMAL LOW (ref 13.0–17.0)

## 2015-07-14 MED ORDER — EPOETIN ALFA 40000 UNIT/ML IJ SOLN: 30000.0000 [IU] | INTRAMUSCULAR | Status: DC

## 2015-07-14 MED ORDER — EPOETIN ALFA 20000 UNIT/ML IJ SOLN
INTRAMUSCULAR | Status: AC
Start: 2015-07-14 — End: 2015-07-14
  Administered 2015-07-14: 20000 [IU] via SUBCUTANEOUS
  Filled 2015-07-14: qty 1

## 2015-07-14 MED ORDER — EPOETIN ALFA 10000 UNIT/ML IJ SOLN
INTRAMUSCULAR | Status: AC
  Administered 2015-07-14: 10000 [IU] via SUBCUTANEOUS
  Filled 2015-07-14: qty 1

## 2015-07-15 NOTE — Telephone Encounter (Signed)
REFILL 

## 2015-07-21 ENCOUNTER — Encounter (HOSPITAL_COMMUNITY)
Admission: RE | Admit: 2015-07-21 | Discharge: 2015-07-21 | Disposition: A | Discharge status: Home / Self Care | Payer: Medicare Other | Source: Ambulatory Visit | Attending: Nephrology | Admitting: Nephrology

## 2015-07-21 DIAGNOSIS — N183 Chronic kidney disease, stage 3 (moderate): Secondary | ICD-10-CM | POA: Diagnosis not present

## 2015-07-21 LAB — POCT HEMOGLOBIN-HEMACUE: Hemoglobin: 11.7 g/dL — ABNORMAL LOW (ref 13.0–17.0)

## 2015-07-21 MED ORDER — EPOETIN ALFA 40000 UNIT/ML IJ SOLN: 30000.0000 [IU] | INTRAMUSCULAR | Status: DC

## 2015-07-21 MED ORDER — EPOETIN ALFA 20000 UNIT/ML IJ SOLN
INTRAMUSCULAR | Status: AC
  Administered 2015-07-21: 20000 [IU]
  Filled 2015-07-21: qty 1

## 2015-07-21 MED ORDER — EPOETIN ALFA 10000 UNIT/ML IJ SOLN
INTRAMUSCULAR | Status: AC
  Administered 2015-07-21: 10000 [IU]
  Filled 2015-07-21: qty 1

## 2015-07-28 ENCOUNTER — Encounter (HOSPITAL_COMMUNITY)
Admission: RE | Admit: 2015-07-28 | Discharge: 2015-07-28 | Disposition: A | Discharge status: Home / Self Care | Payer: Medicare Other | Source: Ambulatory Visit | Attending: Nephrology | Admitting: Nephrology

## 2015-07-28 DIAGNOSIS — N183 Chronic kidney disease, stage 3 (moderate): Secondary | ICD-10-CM | POA: Diagnosis not present

## 2015-07-28 MED ORDER — EPOETIN ALFA 10000 UNIT/ML IJ SOLN
INTRAMUSCULAR | Status: AC
  Filled 2015-07-28: qty 1

## 2015-07-28 MED ORDER — EPOETIN ALFA 20000 UNIT/ML IJ SOLN
INTRAMUSCULAR | Status: AC
  Filled 2015-07-28: qty 1

## 2015-07-28 MED ORDER — EPOETIN ALFA 40000 UNIT/ML IJ SOLN: 30000.0000 [IU] | INTRAMUSCULAR | Status: DC

## 2015-08-07 ENCOUNTER — Other Ambulatory Visit (HOSPITAL_COMMUNITY): Payer: Self-pay | Admitting: *Deleted

## 2015-08-11 ENCOUNTER — Encounter (HOSPITAL_COMMUNITY)
Admission: RE | Admit: 2015-08-11 | Discharge: 2015-08-11 | Disposition: A | Discharge status: Home / Self Care | Payer: Medicare Other | Source: Ambulatory Visit | Attending: Nephrology | Admitting: Nephrology

## 2015-08-11 DIAGNOSIS — Z5181 Encounter for therapeutic drug level monitoring: Secondary | ICD-10-CM | POA: Insufficient documentation

## 2015-08-11 DIAGNOSIS — N183 Chronic kidney disease, stage 3 (moderate): Secondary | ICD-10-CM | POA: Diagnosis present

## 2015-08-11 DIAGNOSIS — Z79899 Other long term (current) drug therapy: Secondary | ICD-10-CM | POA: Insufficient documentation

## 2015-08-11 DIAGNOSIS — D631 Anemia in chronic kidney disease: Secondary | ICD-10-CM | POA: Insufficient documentation

## 2015-08-11 LAB — FERRITIN: Ferritin: 706 ng/mL — ABNORMAL HIGH (ref 24–336)

## 2015-08-11 LAB — IRON AND TIBC
Iron: 110 ug/dL (ref 45–182)
Saturation Ratios: 64 % — ABNORMAL HIGH (ref 17.9–39.5)
TIBC: 172 ug/dL — ABNORMAL LOW (ref 250–450)
UIBC: 62 ug/dL

## 2015-08-11 LAB — POCT HEMOGLOBIN-HEMACUE: HEMOGLOBIN: 10.7 g/dL — AB (ref 13.0–17.0)

## 2015-08-11 MED ORDER — EPOETIN ALFA 20000 UNIT/ML IJ SOLN
INTRAMUSCULAR | Status: AC
  Administered 2015-08-11: 20000 [IU]
  Filled 2015-08-11: qty 1

## 2015-08-11 MED ORDER — EPOETIN ALFA 40000 UNIT/ML IJ SOLN: 30000.0000 [IU] | INTRAMUSCULAR | Status: DC

## 2015-08-11 MED ORDER — EPOETIN ALFA 10000 UNIT/ML IJ SOLN
INTRAMUSCULAR | Status: AC
  Administered 2015-08-11: 10000 [IU]
  Filled 2015-08-11: qty 1

## 2015-08-12 ENCOUNTER — Other Ambulatory Visit: Payer: Self-pay | Admitting: Internal Medicine

## 2015-08-12 MED ORDER — SODIUM BICARBONATE 650 MG PO TABS: 1300.0000 mg | ORAL_TABLET | Freq: Two times a day (BID) | ORAL | Status: AC

## 2015-08-12 NOTE — Telephone Encounter (Signed)
°*  STAT* If patient is at the pharmacy, call can be transferred to refill team.   1. Which medications need to be refilled? (please list name of each medication and dose if known) Sodium Bicarbonate 650mg    2. Which pharmacy/location (including street and city if local pharmacy) is medication to be sent to? [facility] [address]  3. Do they need a 30 day or 90 day supply? 90 to hold him until his Feb appt

## 2015-08-18 ENCOUNTER — Inpatient Hospital Stay (HOSPITAL_COMMUNITY): Admission: RE | Admit: 2015-08-18 | Payer: Medicare Other | Source: Ambulatory Visit

## 2015-08-20 ENCOUNTER — Encounter (HOSPITAL_COMMUNITY)
Admission: RE | Admit: 2015-08-20 | Discharge: 2015-08-20 | Disposition: A | Discharge status: Home / Self Care | Payer: Medicare Other | Source: Ambulatory Visit | Attending: Nephrology | Admitting: Nephrology

## 2015-08-20 DIAGNOSIS — N183 Chronic kidney disease, stage 3 (moderate): Secondary | ICD-10-CM | POA: Diagnosis not present

## 2015-08-20 LAB — POCT HEMOGLOBIN-HEMACUE: HEMOGLOBIN: 9.9 g/dL — AB (ref 13.0–17.0)

## 2015-08-20 MED ORDER — EPOETIN ALFA 40000 UNIT/ML IJ SOLN: 30000.0000 [IU] | INTRAMUSCULAR | Status: DC

## 2015-08-20 MED ORDER — EPOETIN ALFA 20000 UNIT/ML IJ SOLN
INTRAMUSCULAR | Status: AC
  Administered 2015-08-20: 20000 [IU]
  Filled 2015-08-20: qty 1

## 2015-08-20 MED ORDER — EPOETIN ALFA 10000 UNIT/ML IJ SOLN
INTRAMUSCULAR | Status: AC
  Administered 2015-08-20: 10000 [IU]
  Filled 2015-08-20: qty 1

## 2015-08-25 ENCOUNTER — Encounter (HOSPITAL_COMMUNITY)
Admission: RE | Admit: 2015-08-25 | Discharge: 2015-08-25 | Disposition: A | Discharge status: Home / Self Care | Payer: Medicare Other | Source: Ambulatory Visit | Attending: Nephrology | Admitting: Nephrology

## 2015-08-25 DIAGNOSIS — N183 Chronic kidney disease, stage 3 (moderate): Secondary | ICD-10-CM | POA: Diagnosis not present

## 2015-08-25 LAB — POCT HEMOGLOBIN-HEMACUE: HEMOGLOBIN: 9.2 g/dL — AB (ref 13.0–17.0)

## 2015-08-25 MED ORDER — EPOETIN ALFA 20000 UNIT/ML IJ SOLN
INTRAMUSCULAR | Status: AC
  Administered 2015-08-25: 20000 [IU] via SUBCUTANEOUS
  Filled 2015-08-25: qty 1

## 2015-08-25 MED ORDER — EPOETIN ALFA 10000 UNIT/ML IJ SOLN
INTRAMUSCULAR | Status: AC
  Administered 2015-08-25: 10000 [IU] via SUBCUTANEOUS
  Filled 2015-08-25: qty 1

## 2015-08-25 MED ORDER — EPOETIN ALFA 40000 UNIT/ML IJ SOLN: 30000.0000 [IU] | INTRAMUSCULAR | Status: DC

## 2015-09-01 ENCOUNTER — Encounter (HOSPITAL_COMMUNITY)
Admission: RE | Admit: 2015-09-01 | Discharge: 2015-09-01 | Disposition: A | Discharge status: Home / Self Care | Payer: Medicare Other | Source: Ambulatory Visit | Attending: Nephrology | Admitting: Nephrology

## 2015-09-01 DIAGNOSIS — N183 Chronic kidney disease, stage 3 (moderate): Secondary | ICD-10-CM | POA: Diagnosis not present

## 2015-09-01 LAB — POCT HEMOGLOBIN-HEMACUE: HEMOGLOBIN: 9.5 g/dL — AB (ref 13.0–17.0)

## 2015-09-01 MED ORDER — EPOETIN ALFA 10000 UNIT/ML IJ SOLN
INTRAMUSCULAR | Status: AC
  Administered 2015-09-01: 10000 [IU] via SUBCUTANEOUS
  Filled 2015-09-01: qty 1

## 2015-09-01 MED ORDER — EPOETIN ALFA 40000 UNIT/ML IJ SOLN: 30000.0000 [IU] | INTRAMUSCULAR | Status: DC

## 2015-09-01 MED ORDER — EPOETIN ALFA 20000 UNIT/ML IJ SOLN
INTRAMUSCULAR | Status: AC
  Administered 2015-09-01: 20000 [IU] via SUBCUTANEOUS
  Filled 2015-09-01: qty 1

## 2015-09-08 ENCOUNTER — Encounter (HOSPITAL_COMMUNITY)
Admission: RE | Admit: 2015-09-08 | Discharge: 2015-09-08 | Disposition: A | Discharge status: Home / Self Care | Payer: Medicare Other | Source: Ambulatory Visit | Attending: Nephrology | Admitting: Nephrology

## 2015-09-08 DIAGNOSIS — N183 Chronic kidney disease, stage 3 (moderate): Secondary | ICD-10-CM | POA: Diagnosis not present

## 2015-09-08 LAB — POCT HEMOGLOBIN-HEMACUE: HEMOGLOBIN: 10.4 g/dL — AB (ref 13.0–17.0)

## 2015-09-08 MED ORDER — EPOETIN ALFA 20000 UNIT/ML IJ SOLN
INTRAMUSCULAR | Status: AC
  Administered 2015-09-08: 20000 [IU] via SUBCUTANEOUS
  Filled 2015-09-08: qty 1

## 2015-09-08 MED ORDER — EPOETIN ALFA 10000 UNIT/ML IJ SOLN
INTRAMUSCULAR | Status: AC
  Administered 2015-09-08: 10000 [IU] via SUBCUTANEOUS
  Filled 2015-09-08: qty 1

## 2015-09-08 MED ORDER — EPOETIN ALFA 40000 UNIT/ML IJ SOLN: 30000.0000 [IU] | INTRAMUSCULAR | Status: DC

## 2015-09-11 ENCOUNTER — Telehealth: Payer: Self-pay | Admitting: Internal Medicine

## 2015-09-11 NOTE — Telephone Encounter (Signed)
Received records from [facility] for appointment on 10/05/15 with Dr Debara Pickett.  Records given to [facility] (medical records) for Dr Lysbeth Penner schedule on 10/05/15. lp

## 2015-09-15 ENCOUNTER — Encounter (HOSPITAL_COMMUNITY)
Admission: RE | Admit: 2015-09-15 | Discharge: 2015-09-15 | Disposition: A | Discharge status: Home / Self Care | Payer: Medicare Other | Source: Ambulatory Visit | Attending: Nephrology | Admitting: Nephrology

## 2015-09-15 DIAGNOSIS — Z5181 Encounter for therapeutic drug level monitoring: Secondary | ICD-10-CM | POA: Insufficient documentation

## 2015-09-15 DIAGNOSIS — Z79899 Other long term (current) drug therapy: Secondary | ICD-10-CM | POA: Diagnosis not present

## 2015-09-15 DIAGNOSIS — D631 Anemia in chronic kidney disease: Secondary | ICD-10-CM | POA: Diagnosis not present

## 2015-09-15 DIAGNOSIS — N183 Chronic kidney disease, stage 3 (moderate): Secondary | ICD-10-CM | POA: Insufficient documentation

## 2015-09-15 LAB — POCT HEMOGLOBIN-HEMACUE: HEMOGLOBIN: 10 g/dL — AB (ref 13.0–17.0)

## 2015-09-15 MED ORDER — EPOETIN ALFA 20000 UNIT/ML IJ SOLN
INTRAMUSCULAR | Status: AC
  Administered 2015-09-15: 20000 [IU] via SUBCUTANEOUS
  Filled 2015-09-15: qty 1

## 2015-09-15 MED ORDER — EPOETIN ALFA 40000 UNIT/ML IJ SOLN: 30000.0000 [IU] | INTRAMUSCULAR | Status: DC

## 2015-09-15 MED ORDER — EPOETIN ALFA 10000 UNIT/ML IJ SOLN
INTRAMUSCULAR | Status: AC
  Administered 2015-09-15: 10000 [IU] via SUBCUTANEOUS
  Filled 2015-09-15: qty 1

## 2015-09-21 ENCOUNTER — Other Ambulatory Visit: Payer: Self-pay | Admitting: Internal Medicine

## 2015-09-22 ENCOUNTER — Encounter (HOSPITAL_COMMUNITY)
Admission: RE | Admit: 2015-09-22 | Discharge: 2015-09-22 | Disposition: A | Discharge status: Home / Self Care | Payer: Medicare Other | Source: Ambulatory Visit | Attending: Nephrology | Admitting: Nephrology

## 2015-09-22 DIAGNOSIS — N183 Chronic kidney disease, stage 3 (moderate): Secondary | ICD-10-CM | POA: Diagnosis not present

## 2015-09-22 LAB — IRON AND TIBC
Iron: 78 ug/dL (ref 45–182)
SATURATION RATIOS: 40 % — AB (ref 17.9–39.5)
TIBC: 195 ug/dL — ABNORMAL LOW (ref 250–450)
UIBC: 117 ug/dL

## 2015-09-22 LAB — POCT HEMOGLOBIN-HEMACUE: HEMOGLOBIN: 10.7 g/dL — AB (ref 13.0–17.0)

## 2015-09-22 LAB — FERRITIN: Ferritin: 477 ng/mL — ABNORMAL HIGH (ref 24–336)

## 2015-09-22 MED ORDER — EPOETIN ALFA 20000 UNIT/ML IJ SOLN
INTRAMUSCULAR | Status: AC
  Administered 2015-09-22: 20000 [IU] via SUBCUTANEOUS
  Filled 2015-09-22: qty 1

## 2015-09-22 MED ORDER — EPOETIN ALFA 40000 UNIT/ML IJ SOLN: 30000.0000 [IU] | INTRAMUSCULAR | Status: DC

## 2015-09-22 MED ORDER — EPOETIN ALFA 10000 UNIT/ML IJ SOLN
INTRAMUSCULAR | Status: AC
  Administered 2015-09-22: 10000 [IU] via SUBCUTANEOUS
  Filled 2015-09-22: qty 1

## 2015-09-24 ENCOUNTER — Other Ambulatory Visit: Payer: Self-pay | Admitting: Family Medicine

## 2015-09-29 ENCOUNTER — Encounter (HOSPITAL_COMMUNITY)
Admission: RE | Admit: 2015-09-29 | Discharge: 2015-09-29 | Disposition: A | Discharge status: Home / Self Care | Payer: Medicare Other | Source: Ambulatory Visit | Attending: Nephrology | Admitting: Nephrology

## 2015-09-29 DIAGNOSIS — N183 Chronic kidney disease, stage 3 (moderate): Secondary | ICD-10-CM | POA: Diagnosis not present

## 2015-09-29 LAB — POCT HEMOGLOBIN-HEMACUE: HEMOGLOBIN: 10.2 g/dL — AB (ref 13.0–17.0)

## 2015-09-29 MED ORDER — EPOETIN ALFA 40000 UNIT/ML IJ SOLN: 30000.0000 [IU] | INTRAMUSCULAR | Status: DC

## 2015-09-29 MED ORDER — EPOETIN ALFA 10000 UNIT/ML IJ SOLN
INTRAMUSCULAR | Status: AC
  Administered 2015-09-29: 10000 [IU] via SUBCUTANEOUS
  Filled 2015-09-29: qty 1

## 2015-09-29 MED ORDER — EPOETIN ALFA 20000 UNIT/ML IJ SOLN
INTRAMUSCULAR | Status: AC
  Administered 2015-09-29: 20000 [IU] via SUBCUTANEOUS
  Filled 2015-09-29: qty 1

## 2015-10-01 ENCOUNTER — Other Ambulatory Visit: Payer: Self-pay | Admitting: Cardiovascular Disease

## 2015-10-01 NOTE — Telephone Encounter (Signed)
Rx(s) sent to pharmacy electronically.  

## 2015-10-05 ENCOUNTER — Ambulatory Visit (INDEPENDENT_AMBULATORY_CARE_PROVIDER_SITE_OTHER): Payer: Medicare Other | Admitting: Internal Medicine

## 2015-10-05 ENCOUNTER — Encounter: Payer: Self-pay | Admitting: Internal Medicine

## 2015-10-05 VITALS — BP 128/62 | HR 73 | Ht 66.0 in | Wt 116.3 lb

## 2015-10-05 DIAGNOSIS — M3131 Wegener's granulomatosis with renal involvement: Secondary | ICD-10-CM

## 2015-10-05 DIAGNOSIS — I471 Supraventricular tachycardia: Secondary | ICD-10-CM | POA: Diagnosis not present

## 2015-10-05 DIAGNOSIS — R011 Cardiac murmur, unspecified: Secondary | ICD-10-CM | POA: Diagnosis not present

## 2015-10-05 DIAGNOSIS — I2510 Atherosclerotic heart disease of native coronary artery without angina pectoris: Secondary | ICD-10-CM

## 2015-10-05 DIAGNOSIS — I255 Ischemic cardiomyopathy: Secondary | ICD-10-CM

## 2015-10-05 DIAGNOSIS — R0602 Shortness of breath: Secondary | ICD-10-CM | POA: Diagnosis not present

## 2015-10-05 MED ORDER — SIMVASTATIN 20 MG PO TABS: ORAL_TABLET | ORAL | Status: DC

## 2015-10-05 NOTE — Patient Instructions (Signed)
Your physician has requested that you have an echocardiogram @ [address] - [address]. Echocardiography is a painless test that uses sound waves to create images of your heart. It provides your doctor with information about the size and shape of your heart and how well your heart's chambers and valves are working. This procedure takes approximately one hour. There are no restrictions for this procedure.  Your physician wants you to follow-up in: 6 months with Dr. Debara Pickett. You will receive a reminder letter in the mail two months in advance. If you don't receive a letter, please call our office to schedule the follow-up appointment.  Your simvastatin has been refilled Please contact your kidney doctor for the sodium bicarbonate refill - this is a medicine for your kidneys

## 2015-10-05 NOTE — Progress Notes (Signed)
OFFICE NOTE  Chief Complaint:  Dyspnea, follow-up tests  Primary Care Physician: [name], MD  HPI:  Richard [name] is a 80 y.o. male here for follow up after complex hospitalization, with a history of hypothyroidism, HLD, anemia, Wegener's granulomatosis and no prior cardiac disease who presented to Hospital with chest pain 03/13/14, + nuc study and cardiac cath 03/14/14 which revealed severe multivessel CAD involving all 4 major branches. Seen by Dr. Madolyn Frieze who deemed him unsuitable for CABG due to underlying comorbid conditions. ( sts score >30). It was elected to proceed with staged multivessel PCI . On 03/17/14 he had successful PCI to the LAD and POBA to a chronically occluded LCX. There remained high grade proximal disease to a large proximal Ramus branch as well as mid-RCA disease so staged PCI was planned at a later date due to pulmonary issues and renal dysfunction. This was further complicated by right femoral artery pseudoaneurysm requiring repair. PCI on 03/21/14 with rotablator atherectomy with BMS to the mid RCA. Due to length of fluoro time and large volume of contrast, it was decided to medically manage the ramus stenosis which Dr. Angelena Form felt was moderately stenosed and if he has recurrent anginal then consider PCI.   He recently was seen by one of our nurse practitioners who has been working on him to evaluate his anemia. He was also noted to be hyperkalemic. This is required for treatment with Kayexalate and dietary changes. Unfortunate dietary changes as caused him to lose a significant amount of weight. The family is now concerned about what he can do in order to keep his potassium down. He does have stage III chronic kidney disease.   Richard [name] returns today in follow-up. He is been followed by Dr. Jimmy Footman for stage IV kidney disease secondary to Wegener's granulomatosis. He has developed worsening renal function and has a high ANCA titer. He underwent renal biopsy 2  days ago with the thought that he may be able to be switched from Cytoxan over to rituximab. From a cardiovascular standpoint he reports some improvement in his breathing however energy level is still not great. He was doing somewhat better up until his biopsy but he felt like he took somewhat of a step back after that. He is currently holding his antiplatelets but will resume them tomorrow.  I the pleasure see Richard [name] back in the office today. He was recently seen by Dr. Claiborne Billings last week for worsening shortness of breath. Dr. Claiborne Billings felt that this could be worsening angina and ordered a stress test and echocardiogram, additionally he increase his metoprolol from 12.5-25 mg daily and added imdur 30 mg daily. Richard [name] reports that he does not feel any different today than he did last week. He underwent a nuclear stress test which was negative for ischemia. The study was non-gated and therefore an echocardiogram was obtained. The echo demonstrates an improved EF to 60-65%, previously between 40 and 45%. This suggests that his recent revascularization is actually led to some myocardial improvement. He seems to be tolerating the increased dose of beta blocker however blood pressure is low normal today and heart rate is stable. He recently was found to have worsening anemia and has been referred to Dr. Fuller Plan for evaluation of possible acute blood loss.  Richard [name] returns again for follow-up. He recently saw Dr. Ola Spurr doing [place] [facility]. He was noted to have worsening shortness of breath. It was not thought that this is related to his renal  disease but more likely worsening pulmonary function. Richard [name] is previously on oxygen but was taken off of it. It may be that he needs to go back on that. He also has a history of cardiomyopathy which improved after percutaneous intervention. I'm wondering if he's developed any worsening LV dysfunction to explain his worsening shortness of breath. He also has had  more productive cough with sputum, more likely related to worsening Wegener's disease.  PMHx:  Past Medical History  Diagnosis Date  . GERD (gastroesophageal reflux disease)   . Asthma   . Fungal infection     lungs  . Cataract   . BPH (benign prostatic hyperplasia)     sees Dr. Risa Grill, biopsy June 2015 was benign   . Wegener's granulomatosis ([place])     sees Dr. [name]   . Peptic stricture of esophagus   . Anemia   . Hiatal hernia   . Adenomatous colon polyp   . Bronchiectasis ([place])   . On home oxygen therapy     uses 2 l/m nasally at bedtime  . HOH (hard of hearing)     bilaterally  . HTN (hypertension) 09/28/2013  . CAD (coronary artery disease)     a. [place] s/p [place] [employer] x3 and ultimately BMS to pLAD& POBA to CTO of circumflex/marginal vessel on 03/17/14 and staged BMS to [facility] on 03/21/14  . Femoral artery pseudo-aneurysm, right ([place])     a. s/p repair. Follow up with Dr. Kellie Simmering   . Diverticulosis     Past Surgical History  Procedure Laterality Date  . Hernia repair    . Eye surgery      cataracts removed.   . Colonoscopy  08-16-11    per Dr. Fuller Plan, diverticulosis and polyps, repeat in 5 yrs   . Esophagogastroduodenoscopy (egd) with esophageal dilation  11-29-10    per Dr. Fuller Plan   . Cataract extraction, bilateral  12-18-12    bilateral  . Cholecystectomy N/A 12/21/2012    Procedure: LAPAROSCOPIC CHOLECYSTECTOMY WITH INTRAOPERATIVE CHOLANGIOGRAM;  Surgeon: Edward Jolly, MD;  Location: [facility] ORS;  Service: General;  Laterality: N/A;  . Hematoma evacuation Right 03/19/2014    Procedure: Suture repair of femoral artery with evacuation of hematoma;  Surgeon: Mal Misty, MD;  Location: [facility] OR;  Service: Vascular;  Laterality: Right;  . Coronary angioplasty  03/2014  . Left heart catheterization with coronary angiogram N/A 03/14/2014    Procedure: LEFT HEART CATHETERIZATION WITH CORONARY ANGIOGRAM;  Surgeon: Leonie Man, MD;  Location: [facility] CATH LAB;  Service: Cardiovascular;   Laterality: N/A;  . Percutaneous coronary stent intervention (pci-s) N/A 03/17/2014    Procedure: PERCUTANEOUS CORONARY STENT INTERVENTION (PCI-S);  Surgeon: Troy Sine, MD;  Location: [facility] CATH LAB;  Service: Cardiovascular;  Laterality: N/A;  . Cardiac catheterization  03/17/2014    Procedure: CORONARY BALLOON ANGIOPLASTY;  Surgeon: Troy Sine, MD;  Location[facility] C[facility] LAB;  Service: Cardiovascular;;  . Percutaneous coronary stent intervention (pci-s) N/A 03/21/2014    Procedure: PERCUTANEOUS CORONARY STENT INTERVENTION (PCI-S);  Surgeon: Burnell Blanks, MD;  Location: [facility] CATH LAB;  Service: Cardiovascular;  Laterality: N/A;  . Renal biopsy      FAMHx:  Family History  Problem Relation Age of Onset  . Asthma Father   . Coronary artery disease Brother   . Coronary artery disease Brother   . Coronary artery disease Mother   . Colon cancer Neg Hx   . Stomach cancer Neg Hx  SOCHx:   reports that he has never smoked. He has never used smokeless tobacco. He reports that he does not drink alcohol or use illicit drugs.  ALLERGIES:  No Known Allergies  ROS: A comprehensive review of systems was negative except for: Constitutional: positive for fatigue and weight loss Respiratory: positive for dyspnea on exertion  HOME MEDS: Current Outpatient Prescriptions  Medication Sig Dispense Refill  . albuterol (PROAIR HFA) 108 (90 BASE) MCG/ACT inhaler Inhale 2 puffs into the lungs every 6 (six) hours as needed for wheezing or shortness of breath. 1 Inhaler 3  . aspirin EC 81 MG tablet Take 81 mg by mouth daily.    Marland Kitchen b complex vitamins tablet Take 1 tablet by mouth daily.      . bisacodyl (DULCOLAX) 5 MG EC tablet Take 5 mg by mouth 2 (two) times daily.     . budesonide-formoterol (SYMBICORT) 160-4.5 MCG/ACT inhaler Inhale 2 puffs into the lungs 2 (two) times daily.    . calcitRIOL (ROCALTROL) 0.25 MCG capsule Take 1 capsule by mouth daily.  4  . calcium citrate-vitamin D  (CITRACAL+D) 315-200 MG-UNIT per tablet Take 1 tablet by mouth daily.    . clopidogrel (PLAVIX) 75 MG tablet TAKE ONE TABLET BY MOUTH ONCE DAILY WITH BREAKFAST 30 tablet 6  . Cyclophosphamide 50 MG CAPS Take 1 capsule (50 mg total) by mouth daily. Take on an empty stomach 1 hour before or 2 hours after meals.  Currently stopped in the hospital. Please follow-up with your PCP or Pulmonologist regarding decision to resume.    Marland Kitchen dextromethorphan (DELSYM) 30 MG/5ML liquid Take 30 mg by mouth 2 (two) times daily as needed for cough.    . finasteride (PROSCAR) 5 MG tablet Take 1 tablet (5 mg total) by mouth daily. 90 tablet 3  . guaiFENesin (MUCINEX) 600 MG 12 hr tablet Take 600 mg by mouth 2 (two) times daily.    Marland Kitchen HYDROcodone-homatropine (HYDROMET) 5-1.5 MG/5ML syrup Take 5 mLs by mouth every 4 (four) hours as needed. 240 mL 0  . loratadine (CLARITIN) 10 MG tablet Take 10 mg by mouth daily.     . methylcellulose (CITRUCEL) oral powder Take 1 packet by mouth daily.    . metoprolol succinate (TOPROL-XL) 25 MG 24 hr tablet Take 1 tablet (25 mg total) by mouth daily. 30 tablet 11  . nitroGLYCERIN (NITROSTAT) 0.4 MG SL tablet Place 1 tablet (0.4 mg total) under the tongue every 5 (five) minutes x 3 doses as needed for chest pain. 25 tablet 12  . prednisoLONE 5 MG TABS tablet Take 7.5 mg by mouth daily.    . ranitidine (ZANTAC) 300 MG tablet Take 1 tablet (300 mg total) by mouth 2 (two) times daily. 60 tablet 11  . simvastatin (ZOCOR) 20 MG tablet TAKE ONE TABLET BY MOUTH ONCE DAILY AT  6  PM 30 tablet 6  . sodium bicarbonate 650 MG tablet Take 2 tablets (1,300 mg total) by mouth 2 (two) times daily. NEED OV. 120 tablet 1  . SYMBICORT 160-4.5 MCG/ACT inhaler INHALE TWO PUFFS BY MOUTH TWICE DAILY 1 Inhaler 11  . tamsulosin (FLOMAX) 0.4 MG CAPS capsule TAKE ONE CAPSULE BY MOUTH IN THE EVENING 30 capsule 0  . traMADol (ULTRAM) 50 MG tablet Take 1 tablet (50 mg total) by mouth daily as needed for moderate pain.      No current facility-administered medications for this visit.    LABS/IMAGING: No results found for this or any previous visit (from  the past 48 hour(s)). No results found.  VITALS: BP 128/62 mmHg  Pulse 73  Ht 5\' 6"  (1.676 m)  Wt 116 lb 4.8 oz (52.753 kg)  BMI 18.78 kg/m2  EXAM: General appearance: alert, appears older than stated age and no distress Neck: no carotid bruit and no JVD Lungs: diminished breath sounds bilaterally and rhonchi bilaterally Heart: regular rate and rhythm, S1, S2 normal and systolic murmur: early systolic 3/6, crescendo at 2nd right intercostal space Abdomen: soft, non-tender; bowel sounds normal; no masses,  no organomegaly Extremities: extremities normal, atraumatic, no cyanosis or edema Pulses: 2+ and symmetric Skin: Pale, cool skin Neurologic: Grossly normal Psych: Normal  EKG: Normal sinus rhythm at 73  ASSESSMENT: 1. Progressive dyspnea 2. History of Wegener's granulomatosis on immunosuppression 3. Multivessel CAD s/p PCI to the LAD, LCX and RCA - recent low risk nuclear stress test without ischemia and a small fixed defect concerning for scar  4. Stage III CKD 5. Hyperkalemia 6. Ischemic cardiomyopathy, EF 40% -> Now 60-65% 7.   Anemia  PLAN: 1.   Richard [name] again has some progressive dyspnea on exertion. His EF had normalized after percutaneous intervention, but I would like to get an echo to rule out worsening cardiomyopathy or worsening valvular heart disease. He does have a quite loud murmur today on exam. He has follow-up with his pulmonologist next week and I suspect that most of his symptoms are related to worsening pulmonary function. His anemia is been followed and has been fairly stable by Dr. Jimmy Footman. Renal function also appears to be stable.   Pixie Casino, MD, [facility] Attending Cardiologist [place] 10/05/2015, 5:17 PM

## 2015-10-06 ENCOUNTER — Encounter (HOSPITAL_COMMUNITY)
Admission: RE | Admit: 2015-10-06 | Discharge: 2015-10-06 | Disposition: A | Discharge status: Home / Self Care | Payer: Medicare Other | Source: Ambulatory Visit | Attending: Nephrology | Admitting: Nephrology

## 2015-10-06 ENCOUNTER — Encounter (HOSPITAL_COMMUNITY): Payer: Medicare Other

## 2015-10-06 DIAGNOSIS — N183 Chronic kidney disease, stage 3 (moderate): Secondary | ICD-10-CM | POA: Diagnosis not present

## 2015-10-06 LAB — POCT HEMOGLOBIN-HEMACUE: Hemoglobin: 10.3 g/dL — ABNORMAL LOW (ref 13.0–17.0)

## 2015-10-06 MED ORDER — EPOETIN ALFA 40000 UNIT/ML IJ SOLN: 30000.0000 [IU] | INTRAMUSCULAR | Status: DC

## 2015-10-06 MED ORDER — EPOETIN ALFA 10000 UNIT/ML IJ SOLN
INTRAMUSCULAR | Status: AC
  Administered 2015-10-06: 10000 [IU] via SUBCUTANEOUS
  Filled 2015-10-06: qty 1

## 2015-10-06 MED ORDER — EPOETIN ALFA 20000 UNIT/ML IJ SOLN
INTRAMUSCULAR | Status: AC
  Administered 2015-10-06: 20000 [IU] via SUBCUTANEOUS
  Filled 2015-10-06: qty 1

## 2015-10-08 ENCOUNTER — Ambulatory Visit: Payer: Medicare Other | Admitting: Internal Medicine

## 2015-10-13 ENCOUNTER — Ambulatory Visit (INDEPENDENT_AMBULATORY_CARE_PROVIDER_SITE_OTHER)
Admission: RE | Admit: 2015-10-13 | Discharge: 2015-10-13 | Disposition: A | Discharge status: Home / Self Care | Payer: Medicare Other | Source: Ambulatory Visit | Attending: Internal Medicine | Admitting: Internal Medicine

## 2015-10-13 ENCOUNTER — Encounter: Payer: Self-pay | Admitting: Internal Medicine

## 2015-10-13 ENCOUNTER — Ambulatory Visit (INDEPENDENT_AMBULATORY_CARE_PROVIDER_SITE_OTHER): Payer: Medicare Other | Admitting: Internal Medicine

## 2015-10-13 ENCOUNTER — Encounter (HOSPITAL_COMMUNITY)
Admission: RE | Admit: 2015-10-13 | Discharge: 2015-10-13 | Disposition: A | Discharge status: Home / Self Care | Payer: Medicare Other | Source: Ambulatory Visit | Attending: Nephrology | Admitting: Nephrology

## 2015-10-13 VITALS — BP 104/60 | HR 73 | Wt 114.0 lb

## 2015-10-13 DIAGNOSIS — N183 Chronic kidney disease, stage 3 (moderate): Secondary | ICD-10-CM | POA: Insufficient documentation

## 2015-10-13 DIAGNOSIS — M3131 Wegener's granulomatosis with renal involvement: Secondary | ICD-10-CM | POA: Diagnosis not present

## 2015-10-13 DIAGNOSIS — J471 Bronchiectasis with (acute) exacerbation: Secondary | ICD-10-CM

## 2015-10-13 DIAGNOSIS — Z5181 Encounter for therapeutic drug level monitoring: Secondary | ICD-10-CM | POA: Insufficient documentation

## 2015-10-13 DIAGNOSIS — Z79899 Other long term (current) drug therapy: Secondary | ICD-10-CM | POA: Insufficient documentation

## 2015-10-13 DIAGNOSIS — D631 Anemia in chronic kidney disease: Secondary | ICD-10-CM | POA: Insufficient documentation

## 2015-10-13 LAB — POCT HEMOGLOBIN-HEMACUE: HEMOGLOBIN: 10.4 g/dL — AB (ref 13.0–17.0)

## 2015-10-13 MED ORDER — EPOETIN ALFA 10000 UNIT/ML IJ SOLN
INTRAMUSCULAR | Status: AC
  Administered 2015-10-13: 10000 [IU] via SUBCUTANEOUS
  Filled 2015-10-13: qty 1

## 2015-10-13 MED ORDER — EPOETIN ALFA 40000 UNIT/ML IJ SOLN: 30000.0000 [IU] | INTRAMUSCULAR | Status: DC

## 2015-10-13 MED ORDER — EPOETIN ALFA 20000 UNIT/ML IJ SOLN
INTRAMUSCULAR | Status: AC
  Administered 2015-10-13: 20000 [IU] via SUBCUTANEOUS
  Filled 2015-10-13: qty 1

## 2015-10-13 NOTE — Progress Notes (Signed)
Subjective:    Patient ID: Richard [name], male    DOB: 1935-08-22     MRN: [id number]   Brief patient profile:  21  yowm never smoker with dx of Longstanding bronchiectasis then dx with WG 03/2010 > requested establish with Leston Schueller.   Admit [facility] dx WG by pos c anca 03/2010, supportive tbbx but not vats   Admit [name] dx spont L Ptx 9/08/15/09 requiring Chest tube but no surgery   11/23/2012 f/u ov/Marylin Lathon re WG/ bronchiectasis Chief Complaint  Patient presents with  . Follow-up    Had PFT today-sob same,cough-yellow,wheezing occass.,  no hemoptysis, no real limiting sob though sedentary, no arthralgias or rash maintaining 2.5 mg daily No change prednisone 5 mg one half daily  > ok to try every other day if doing great May 1 flare rx levaquin and pred May 16 lap chole [place]   08/19/2013 f/u ov/Lucas Winograd re: ? Recurrent WG Chief Complaint  Patient presents with  . Follow-up    Breathing is unchanged. Cough unchanged-prod cpugh w/ yellow phlem. On 2nd dose of cipro  now on day 5/10 cipro and less dark, never bloody, no sinus complaints, arthritis rash or fever or hematuria. >ANCA 1:80 titer, started on cytoxan          Patient ID: Richard [name]  MRN: [id number], DOB/AGE: [birth year] 80 y.o.  Admit date: 03/13/2014  D/C date: 03/26/2014   Primary Cardiologist: Dr. Debara Pickett  Principal Problem:  Unstable Class III Angina  Active Problems:  HYPOTHYROIDISM  HYPERLIPIDEMIA  ANEMIA  Wegener's granulomatosis  Chronic respiratory failure  Abnormal nuclear stress test: Intermediate risk with moderate region of apical and inferior scar with mild superimposed ischemia; EF 40%  CAD- severe 4 V CAD- not CABG candidate  Anemia- transfused 03/16/14  Chronic renal insufficiency, stage III (moderate)  Cardiomyopathy, ischemic- EF 40% by Myoview  Acute blood loss anemia  Femoral artery pseudo-aneurysm, right  Admission Dates: 03/13/14 - 03/26/14  Discharge Diagnosis: [place] s/p [facility] x3 and ultimately BMS to  pLAD& POBA to CTO of circumflex/marginal vessel on 03/17/14 and staged BMS to mRCA on 03/21/14  HPI: [name] is a 80 y.o. male with a history of hypothyroidism, HLD, anemia, Wegener's granulomatosis and no prior cardiac disease who presented to [facility] ED on 03/13/14 with chest pain and was transferred to [facility] for cath the following morning.  PMH significant for Wegener's granulomatosis and follows with Dr. [name] for his pulmonary disease (severe multifocal bronchiectasis as well as numerous cavitary and non-cavitary nodules). He had been having chest tightness with significant exertion for the last few months with increasing dyspnea on exertion. Also had 2 brothers who both had CABG. Referred to Dr. Debara Pickett who proceeded with nuclear stress testing which demonstrated a "moderate region of apical and inferior scar with mild superimposed qualitative ischemia". LV wall motion demonstrated a "moderate degree of hypokinesis involving the mid-distal inferior wall and apex with an EF of 40%". Prior echo dated 03/30/2010 showed EF 45-50%.  Hospital Course: He presented to [facility] ED with increasing fatigue and chest tightness which developed that morning, which resolved within 10 minutes.  He was given ASA, heparin, and nitro paste. ECG demonstrated sinus tachycardia with no acute ST-T abnormalities. POC troponin normal. Also noted to be anemic, Hgb 9 (10 on 12/06/13). Dr. [name] noted that Mr. Costas has had hemoptysis in the past, therefore limiting DAPT was ideal and BMS over DES thought to be the best option.  [place]/CAD:  -- Abnormal outpatient  nuclear stress test on 03/12/14: Intermediate risk with moderate region of apical and inferior scar with mild superimposed ischemia; EF 40%  -- s/p first LHC on 03/14/14 which revealed severe multivessel CAD involving all 4 major branches. Seen by Dr. Madolyn Frieze on this admission who deemed him unsuitable for CABG due to underlying comorbid conditions. ( sts score >30). It was elected to  proceed with staged multivessel PCI .  -- s/p LHC on 03/17/14 with successful PCI to the LAD and POBA to a chronically occluded LCX. There remained high grade proximal disease to a large proximal Ramus branch as well as mid-RCA disease so staged PCI was planned at a later date due to pulmonary issues and renal dysfunction. This was further complicated by right femoral artery pseudoaneurysm requiring repair. LHC on 03/21/14 s/p PCI with rotablator atherectomy with BMS to the mid RCA. Due to length of fluoro time and large volume of contrast, it was decided to medically manage the ramus stenosis which Dr. Angelena Form felt was moderately stenosed and if he has recurrent anginal then consider PCI.  -- Continue ASA/BB/Plavix/statin  Right femoral pseudoaneurysm: s/p suture repair of femoral artery with evacuation of hematoma on 03/19/14. Still with significant hematoma down buttox and hamstring but improving. CBC stable at 7.7.  -- Has follow up with Dr. Kellie Simmering on 04/08/2014  Anemia: subsequent to right femoral pseudoaneurysm- s/p surgical repair. S/p blood transfusion x 2. Hgb improved from 7.4 to 8.6. Stable at 7.7 on discharge.  --Will send home on Iron 324 mg daily with colace 100 mg BID to help prevent constipation  SOB - BNP elevated at 4937. D-Dimer elevated at 2.6. CTA with no acute PE. No s/s of volume overload  -- Though to be possibly due to anemia. Chest xray showed multiple cavitary lesions but no CHF. CTA with multiple abnormalities possibly reflecting acute bronchitis vs organizing PNA. PCCM (followed by Dr. [name]) consulted who felt that the CT was at his baseline and to continue current therapy. He thought his SOB was likely multifactorial and anemia also playing a role.  -- BNP elevated at 4937. D-Dimer elevated at 2.6. CTA with no acute PE. No s/s of volume overload  Chronic systolic CHF- 2 D ECHO with LVEF 40-45%, mid to distal inferoapical and apical hypokinesis, diastolic dysfunction with elevated  LV filling pressure, mild to moderate RV hypokinesis.  Wegener's granulomatosis. ( per PCCM)  -- Continue cytoxan, prednisone  -- Bactrim for PCP prophylaxis  -- Will need pulmonary rehab as an outpatient  Chronic renal insufficiency, stage III (moderate).  -- Creat elevated but stable after contrast exposure for CTA  -- Creat stable at 1.48 today. Continue to monitor  The patient has had a prolonged hospital course but is recovering well. The femoral catheter site is stable. He has been seen by Dr. Irish Lack today and deemed ready for discharge home. All follow-up appointments have been scheduled. Discharge medications are listed below.         09/02/2014 Follow up : Bronchiectasis/NP Patient returns for a two-week follow-up. Last visit. Patient with a bronchiectatic exacerbation. He was treated with Augmentin 10 days and a prednisone burst. Patient has started to slowly feel improved with decreased discolored mucus. Shortness of breath is not quite as bad. He has made very slow progress. He denies any hemoptysis, orthopnea, PND, leg swelling or calf pain. Appetite is fair. No nausea, vomiting or diarrhea. Chest x-ray last visit showed chronic changes rec Continue on current regimen  Continue with VEST  therapy.     10/30/2014 f/u ov/Kareemah Grounds re: bronchiectasis/AB Chief Complaint  Patient presents with  . Follow-up    Pt c/o increased SOB and chest congestion for the past 5 days. Cough is prod with yellow sputum.     rx with levaquin x 2 days but only used saba twice ? How much can you use it if you are having a bad day with your breathing A Not sure Baseline = sob x more than slow adls rec Increase prednisone to 15 mg per day until better then back to 7.5 mg daily  Augmentin 875 mg take one pill twice daily  X 10 days - take at breakfast and supper with large glass of water.  It would help reduce the usual side effects (diarrhea and yeast infections) if you ate cultured yogurt at  lunch.  For breathing/coughing/ congestion >  Up to 2 puff every 4 hours as needed  Make sure you are taking pepcid 20 mg after bfast and supper  See Tammy NP w/in 2 weeks with all your medications > did not do     07/08/2015  f/u ov/Sebrina Kessner re: bronchiectasis/ on pred 7.5 mg daily / 50 mg daily cytoxan per renal  Chief Complaint  Patient presents with  . Follow-up    Pt c/o continued dyspnea with exertion and wet cough with yellow mucus. Pt denies wheeze/CP/tightness.   not using flutter / not understanding contingencies prev listed on his home made med calendar but no longer using it anyway   maint on sym 160 2bid with baseline doe = MMRC1 = can walk nl pace, flat grade, can't hurry or go uphills or steps s sob  Not sure vest helping and not using as consistently   rec Levaquin 750 mg  Daily x 5-7 days when mucus gets nasty  Use the flutter valve just as much as you can   10/13/2015  f/u ov/Diannah Rindfleisch re: WG/ bronchiectasis on 7 day cycles of levaquin/ not using flutter / is using vest up to an hour a day / pred 7.5/ctx 50  Chief Complaint  Patient presents with  . Follow-up    Breathing is unchanged. His cough is prod with moderate yellow sputum.   firs thing in am is worst time for breathing and then takes symbicort 160 x 2 and better/ rare need for saba  Doe x MMRC1 = can walk nl pace, flat grade, can't hurry or go uphills or steps s sob   levaquin x 7 days improves mucus and never bloody,  Helps for at least  2 weeks then usually  recurs    No obvious day to day or daytime variabilty or assoc increase in   cp or chest tightness, subjective wheeze overt sinus or hb symptoms. No unusual exp hx or h/o childhood pna/ asthma or knowledge of premature birth.  Sleeping ok without nocturnal  or early am exacerbation  of respiratory  c/o's or need for noct saba. Also denies any obvious fluctuation of symptoms with weather or environmental changes or other aggravating or alleviating factors except  as outlined above   Current Medications, Allergies, Complete Past Medical History, Past Surgical History, Family History, and Social History were reviewed in [employer] record.  ROS  The following are not active complaints unless bolded sore throat, dysphagia, dental problems, itching, sneezing,  nasal congestion or excess/ purulent secretions, ear ache,   fever, chills, sweats, unintended wt loss, pleuritic or exertional cp, hemoptysis,  orthopnea pnd  or leg swelling, presyncope, palpitations, heartburn, abdominal pain, anorexia, nausea, vomiting, diarrhea  or change in bowel or urinary habits, change in stools or urine, dysuria,hematuria,  rash, arthralgias, visual complaints, headache, numbness weakness or ataxia or problems with walking or coordination,  change in mood/affect or memory.         Objective:   Physical Exam  wt   128  04/26/11 >07/08/2011  134> 134 11/28/2011 > 02/29/2012 136> 05/31/2012  136>  134 11/22/12 > 07/26/2013  136 > 08/21/2013  136 >133 09/06/2013 >  09/12/2013  133 > 132 09/27/2013 > 10/15/2013 131 > 11/07/2013  128 > 01/09/14 124 > 02/12/2014  123 > 04/11/2014   117 > 05/23/2014 119 > 08/19/2014 110 > 10/30/2014  120 >  04/09/2015 120 > 07/08/2015  119 > 10/13/2015  114   Vital signs reviewed   amb thin pleasant wm nad  SKIN: no rash, lesions  NODES: no lymphadenopathy  HEENT: [place]/AT, EOM- WNL, Conjuctivae- clear, PERRLA, TM-WNL, Nose- clear, Throat- clear and wnl, Mallampati III  NECK: Supple w/ fair ROM, JVD- none, normal carotid impulses w/o bruits Thyroid-  CHEST: minimal exp rhonchi bilaterally  No resp distress HEART: RRR, no m/g/r heard  ABDOMEN: Soft and nl EXT:  No deformities or restrucitons        CXR PA and Lateral:   10/13/2015 :    I personally reviewed images and agree with radiology impression as follows:    Extensive chronic fibrotic change consistent with the patient's history of reactive airway disease, bronchiectasis, and chronic fungal  infection. Overall the parenchymal densities are slightly less conspicuous today. Moderate-sized hiatal hernia.      Assessment & Plan:

## 2015-10-13 NOTE — Patient Instructions (Addendum)
No change in recs for now  Please remember to go to the  x-ray department downstairs for your tests - we will call you with the results when they are available.     Please schedule a follow up visit in 3 months but call sooner if needed with pfts

## 2015-10-14 NOTE — Progress Notes (Signed)
Quick Note:  Spoke with pt and notified of results per Dr. [name]. Pt verbalized understanding and denied any questions.  ______ 

## 2015-10-18 ENCOUNTER — Encounter: Payer: Self-pay | Admitting: Internal Medicine

## 2015-10-18 NOTE — Assessment & Plan Note (Signed)
-    HFA 90% p coaching 12/16/2010   -   PFT's 06/21/11   FEV1  1.60 (61%) ratio 48 and 10% better p B2,  DLCO 83%  - CT 10/04/13 severe multifocal bronchiectasis as well as numerous cavitary and      non-cavitary nodules  -   PFT's 11/22/2012 FEV1  1.52 (62%) arnd 46 and no change p B2 DLCO 89%  -    VEST rec 02/12/2014 > reported improvement 05/23/14  - 07/08/2015  extensive coaching HFA effectiveness =  90%     Adequate control on present rx, reviewed > no change in rx needed    I had an extended discussion with the patient reviewing all relevant studies completed to date and  lasting 15 to 20 minutes of a 25 minute visit    Each maintenance medication was reviewed with pt and wife  in detail including most importantly the difference between maintenance and prns and under what circumstances the prns are to be triggered using an action plan format that is not reflected in the computer generated alphabetically organized AVS.    Please see instructions for details which were reviewed in writing and the patient given a copy highlighting the part that I personally wrote and discussed at today's ov.

## 2015-10-18 NOTE — Assessment & Plan Note (Signed)
rx per Renal

## 2015-10-20 ENCOUNTER — Encounter (HOSPITAL_COMMUNITY)
Admission: RE | Admit: 2015-10-20 | Discharge: 2015-10-20 | Disposition: A | Discharge status: Home / Self Care | Payer: Medicare Other | Source: Ambulatory Visit | Attending: Nephrology | Admitting: Nephrology

## 2015-10-20 ENCOUNTER — Other Ambulatory Visit: Payer: Self-pay

## 2015-10-20 ENCOUNTER — Ambulatory Visit (HOSPITAL_COMMUNITY): Payer: Medicare Other | Attending: Internal Medicine

## 2015-10-20 DIAGNOSIS — R0602 Shortness of breath: Secondary | ICD-10-CM | POA: Diagnosis not present

## 2015-10-20 DIAGNOSIS — I119 Hypertensive heart disease without heart failure: Secondary | ICD-10-CM | POA: Diagnosis not present

## 2015-10-20 DIAGNOSIS — R011 Cardiac murmur, unspecified: Secondary | ICD-10-CM | POA: Diagnosis not present

## 2015-10-20 DIAGNOSIS — I350 Nonrheumatic aortic (valve) stenosis: Secondary | ICD-10-CM | POA: Diagnosis not present

## 2015-10-20 DIAGNOSIS — N183 Chronic kidney disease, stage 3 (moderate): Secondary | ICD-10-CM | POA: Diagnosis not present

## 2015-10-20 LAB — ECHOCARDIOGRAM COMPLETE
AORTIC ROOT 2D: 36 mm
AV Mean grad: 20 mmHg
AV Peak grad: 33 mmHg
AVA: 1.22 cm2
Ao pk vel: 0.3 m/s
CHL CUP AV PEAK INDEX: 0.81
CHL CUP AV VEL: 1.22
CHL CUP LA VOL 2D INDEX: 28.6 mL/m2
CHL CUP LA VOL 2D: 44.1 mL
CHL CUP LVOT MEAN VEL: 63.9 cm/s
CHL CUP LVOT SV INDEX: 49 mL/m2
DOP CAL AO MEAN VELOCITY: 212 cm/s
EERAT: 14.23
EWDT: 373 ms
FS: 42 % (ref 28–44)
IVS/LV PW RATIO, ED: 1.61
LA ID, A-P, ES: 38 mm
LA SIZE INDEX: 2.46 mm/m2
LA diam end sys: 38 mm
LA vol index: 24.8 mL/m2
LA vol: 38.3 mL
LV PW s: 10.4 mm
LV TDI E'LATERAL: 7.1 cm/s
LVIDD: 29.1 mm — AB (ref 3.5–6.0)
LVIDS: 16.9 mm — AB (ref 2.1–4.0)
LVOT VTI: 18.2 cm
LVOT area: 4.15 cm2
LVOT peak VTI: 0.29 cm
LVOTD: 23 mm
LVOTPV: 86.3 cm/s
LVOTSV: 76 mL
MV Dec: 373 ms
MV pk A vel: 140 cm/s
MV pk E vel: 101 cm/s
MVPG: 4 mmHg
PW: 10.4 mm — AB (ref 0.6–1.1)
RV TAPSE: 21.2 mm
TDI e' medial: 4.56 cm/s
TRMAXVEL: 213 cm/s
TV PEAK RV-RA GRADIENT: 18 cm/s
VTI: 62 cm
Valve area index: 0.79

## 2015-10-20 LAB — IRON AND TIBC
Iron: 67 ug/dL (ref 45–182)
SATURATION RATIOS: 38 % (ref 17.9–39.5)
TIBC: 178 ug/dL — AB (ref 250–450)
UIBC: 111 ug/dL

## 2015-10-20 LAB — FERRITIN: FERRITIN: 369 ng/mL — AB (ref 24–336)

## 2015-10-20 LAB — POCT HEMOGLOBIN-HEMACUE: Hemoglobin: 10.4 g/dL — ABNORMAL LOW (ref 13.0–17.0)

## 2015-10-20 MED ORDER — EPOETIN ALFA 10000 UNIT/ML IJ SOLN
INTRAMUSCULAR | Status: AC
  Administered 2015-10-20: 10000 [IU] via SUBCUTANEOUS
  Filled 2015-10-20: qty 1

## 2015-10-20 MED ORDER — EPOETIN ALFA 20000 UNIT/ML IJ SOLN
INTRAMUSCULAR | Status: AC
  Administered 2015-10-20: 20000 [IU] via SUBCUTANEOUS
  Filled 2015-10-20: qty 1

## 2015-10-20 MED ORDER — EPOETIN ALFA 40000 UNIT/ML IJ SOLN: 30000.0000 [IU] | INTRAMUSCULAR | Status: DC

## 2015-10-22 ENCOUNTER — Other Ambulatory Visit: Payer: Self-pay | Admitting: *Deleted

## 2015-10-22 DIAGNOSIS — R011 Cardiac murmur, unspecified: Secondary | ICD-10-CM

## 2015-10-22 DIAGNOSIS — I350 Nonrheumatic aortic (valve) stenosis: Secondary | ICD-10-CM

## 2015-10-26 ENCOUNTER — Other Ambulatory Visit: Payer: Self-pay | Admitting: Family Medicine

## 2015-10-27 ENCOUNTER — Encounter (HOSPITAL_COMMUNITY)
Admission: RE | Admit: 2015-10-27 | Discharge: 2015-10-27 | Disposition: A | Discharge status: Home / Self Care | Payer: Medicare Other | Source: Ambulatory Visit | Attending: Nephrology | Admitting: Nephrology

## 2015-10-27 DIAGNOSIS — N183 Chronic kidney disease, stage 3 (moderate): Secondary | ICD-10-CM | POA: Diagnosis not present

## 2015-10-27 LAB — POCT HEMOGLOBIN-HEMACUE: Hemoglobin: 9.9 g/dL — ABNORMAL LOW (ref 13.0–17.0)

## 2015-10-27 MED ORDER — EPOETIN ALFA 20000 UNIT/ML IJ SOLN
INTRAMUSCULAR | Status: AC
  Administered 2015-10-27: 20000 [IU]
  Filled 2015-10-27: qty 1

## 2015-10-27 MED ORDER — EPOETIN ALFA 40000 UNIT/ML IJ SOLN: 30000.0000 [IU] | INTRAMUSCULAR | Status: DC

## 2015-10-27 MED ORDER — EPOETIN ALFA 10000 UNIT/ML IJ SOLN
INTRAMUSCULAR | Status: AC
  Administered 2015-10-27: 10000 [IU]
  Filled 2015-10-27: qty 1

## 2015-11-03 ENCOUNTER — Encounter (HOSPITAL_COMMUNITY)
Admission: RE | Admit: 2015-11-03 | Discharge: 2015-11-03 | Disposition: A | Discharge status: Home / Self Care | Payer: Medicare Other | Source: Ambulatory Visit | Attending: Nephrology | Admitting: Nephrology

## 2015-11-03 DIAGNOSIS — N183 Chronic kidney disease, stage 3 (moderate): Secondary | ICD-10-CM | POA: Diagnosis not present

## 2015-11-03 LAB — POCT HEMOGLOBIN-HEMACUE: Hemoglobin: 10.1 g/dL — ABNORMAL LOW (ref 13.0–17.0)

## 2015-11-03 MED ORDER — EPOETIN ALFA 40000 UNIT/ML IJ SOLN: 30000.0000 [IU] | INTRAMUSCULAR | Status: DC

## 2015-11-03 MED ORDER — EPOETIN ALFA 10000 UNIT/ML IJ SOLN
INTRAMUSCULAR | Status: AC
  Administered 2015-11-03: 30000 [IU]
  Filled 2015-11-03: qty 1

## 2015-11-03 MED ORDER — EPOETIN ALFA 20000 UNIT/ML IJ SOLN
INTRAMUSCULAR | Status: AC
  Filled 2015-11-03: qty 1

## 2015-11-04 ENCOUNTER — Telehealth: Payer: Self-pay | Admitting: Family Medicine

## 2015-11-04 MED FILL — Epoetin Alfa Inj 20000 Unit/ML: INTRAMUSCULAR | Qty: 1 | Status: AC

## 2015-11-04 MED FILL — Epoetin Alfa Inj 10000 Unit/ML: INTRAMUSCULAR | Qty: 1 | Status: AC

## 2015-11-04 NOTE — Telephone Encounter (Signed)
Yes, okay to schedule.

## 2015-11-04 NOTE — Telephone Encounter (Signed)
Pt states his nose has been bleeding on and off and he would like to see Dr Sarajane Jews tomorrow. Only one same day left. Can I use?

## 2015-11-05 ENCOUNTER — Encounter: Payer: Self-pay | Admitting: Family Medicine

## 2015-11-05 ENCOUNTER — Ambulatory Visit (INDEPENDENT_AMBULATORY_CARE_PROVIDER_SITE_OTHER): Payer: Medicare Other | Admitting: Family Medicine

## 2015-11-05 VITALS — BP 111/63 | HR 92 | Temp 98.6°F | Ht 66.0 in | Wt 116.0 lb

## 2015-11-05 DIAGNOSIS — J209 Acute bronchitis, unspecified: Secondary | ICD-10-CM | POA: Diagnosis not present

## 2015-11-05 DIAGNOSIS — R040 Epistaxis: Secondary | ICD-10-CM | POA: Diagnosis not present

## 2015-11-05 MED ORDER — CLARITHROMYCIN 500 MG PO TABS: 500.0000 mg | ORAL_TABLET | Freq: Two times a day (BID) | ORAL | Status: DC

## 2015-11-05 NOTE — Progress Notes (Signed)
Pre visit review using our clinic review tool, if applicable. No additional management support is needed unless otherwise documented below in the visit note. 

## 2015-11-05 NOTE — Progress Notes (Signed)
   Subjective:    Patient ID: Richard [name], male    DOB: September 10, 1935, 80 y.o.   MRN: [id number]  HPI Here for 2 things. First he has had 10 days of chest congestion and coughing up yellow sputum. No fever. He has taken 7 days of Levaquin with little improvement. Also for 5 days he has had frequent nosebleeds out of the left nostril.    Review of Systems  Constitutional: Negative.   HENT: Positive for congestion, nosebleeds and postnasal drip. Negative for ear pain, facial swelling, sinus pressure and sore throat.   Eyes: Negative.   Respiratory: Positive for cough, chest tightness, shortness of breath and wheezing.   Cardiovascular: Negative.        Objective:   Physical Exam  Constitutional: He appears well-developed and well-nourished.  HENT:  Right Ear: External ear normal.  Left Ear: External ear normal.  Mouth/Throat: Oropharynx is clear and moist.  Small blood clot in the left anterior triangle   Eyes: Conjunctivae are normal.  Neck: No thyromegaly present.  Pulmonary/Chest: Effort normal. No respiratory distress. He has no rales.  Scattered rhonchi   Lymphadenopathy:    He has no cervical adenopathy.          Assessment & Plan:  For the bronchitis, take 10 days of Biaxin. For the nosebleeds he will avoid blowing his nose if possible, use saline nasal sprays 4-6 times a day, and apply Vaseline to the area bid.

## 2015-11-06 NOTE — Telephone Encounter (Signed)
Pharmacist states there are some significant drug interactions between pt's meds and the clarithromycin (BIAXIN) 500 MG tablet  Pharmacy will not fill until they hear from Dr Sarajane Jews. Pt has been up there 2 times, not knowing this was the situation. Advised pt I would cannot sylvia and get an answer asap.

## 2015-11-06 NOTE — Telephone Encounter (Signed)
I spoke with pharmacy and gave the okay and spoke with pt.

## 2015-11-06 NOTE — Telephone Encounter (Signed)
I spoke with pharmacy, they advised for pt to stop the Tamsulosin & Simvastatin just while taking the Biaxin. Per Dr. Sarajane Jews this will be okay.

## 2015-11-10 ENCOUNTER — Encounter (HOSPITAL_COMMUNITY)
Admission: RE | Admit: 2015-11-10 | Discharge: 2015-11-10 | Disposition: A | Discharge status: Home / Self Care | Payer: Medicare Other | Source: Ambulatory Visit | Attending: Nephrology | Admitting: Nephrology

## 2015-11-10 DIAGNOSIS — Z5181 Encounter for therapeutic drug level monitoring: Secondary | ICD-10-CM | POA: Diagnosis not present

## 2015-11-10 DIAGNOSIS — D631 Anemia in chronic kidney disease: Secondary | ICD-10-CM | POA: Insufficient documentation

## 2015-11-10 DIAGNOSIS — N183 Chronic kidney disease, stage 3 (moderate): Secondary | ICD-10-CM | POA: Diagnosis not present

## 2015-11-10 DIAGNOSIS — Z79899 Other long term (current) drug therapy: Secondary | ICD-10-CM | POA: Diagnosis not present

## 2015-11-10 LAB — POCT HEMOGLOBIN-HEMACUE: HEMOGLOBIN: 10.6 g/dL — AB (ref 13.0–17.0)

## 2015-11-10 MED ORDER — EPOETIN ALFA 40000 UNIT/ML IJ SOLN
30000.0000 [IU] | INTRAMUSCULAR | Status: DC
Start: 2015-11-10 — End: 2015-11-11

## 2015-11-10 MED ORDER — EPOETIN ALFA 10000 UNIT/ML IJ SOLN
INTRAMUSCULAR | Status: AC
  Administered 2015-11-10: 10000 [IU] via SUBCUTANEOUS
  Filled 2015-11-10: qty 1

## 2015-11-10 MED ORDER — EPOETIN ALFA 20000 UNIT/ML IJ SOLN
INTRAMUSCULAR | Status: AC
  Administered 2015-11-10: 20000 [IU] via SUBCUTANEOUS
  Filled 2015-11-10: qty 1

## 2015-11-17 ENCOUNTER — Encounter (HOSPITAL_COMMUNITY)
Admission: RE | Admit: 2015-11-17 | Discharge: 2015-11-17 | Disposition: A | Discharge status: Home / Self Care | Payer: Medicare Other | Source: Ambulatory Visit | Attending: Nephrology | Admitting: Nephrology

## 2015-11-17 DIAGNOSIS — N183 Chronic kidney disease, stage 3 (moderate): Secondary | ICD-10-CM | POA: Diagnosis not present

## 2015-11-17 LAB — IRON AND TIBC
IRON: 75 ug/dL (ref 45–182)
Saturation Ratios: 38 % (ref 17.9–39.5)
TIBC: 197 ug/dL — AB (ref 250–450)
UIBC: 122 ug/dL

## 2015-11-17 LAB — FERRITIN: FERRITIN: 273 ng/mL (ref 24–336)

## 2015-11-17 LAB — POCT HEMOGLOBIN-HEMACUE: Hemoglobin: 11 g/dL — ABNORMAL LOW (ref 13.0–17.0)

## 2015-11-17 MED ORDER — EPOETIN ALFA 10000 UNIT/ML IJ SOLN
INTRAMUSCULAR | Status: AC
  Administered 2015-11-17: 10000 [IU] via SUBCUTANEOUS
  Filled 2015-11-17: qty 1

## 2015-11-17 MED ORDER — EPOETIN ALFA 40000 UNIT/ML IJ SOLN: 30000.0000 [IU] | INTRAMUSCULAR | Status: DC

## 2015-11-17 MED ORDER — EPOETIN ALFA 20000 UNIT/ML IJ SOLN
INTRAMUSCULAR | Status: AC
  Administered 2015-11-17: 20000 [IU] via SUBCUTANEOUS
  Filled 2015-11-17: qty 1

## 2015-11-19 IMAGING — CR DG CHEST 2V
2 series · 2 of 2 positions shown · non-contrast
Comparison: PA and lateral chest x-ray August 19, 2014

CLINICAL DATA: Cough, shortness of breath ; history of asthma,
Charl[place] granulomatosis, fungal infection of the lungs, coronary
artery disease.

EXAM:
CHEST  2 VIEW

[view not recorded (1 of 2)]
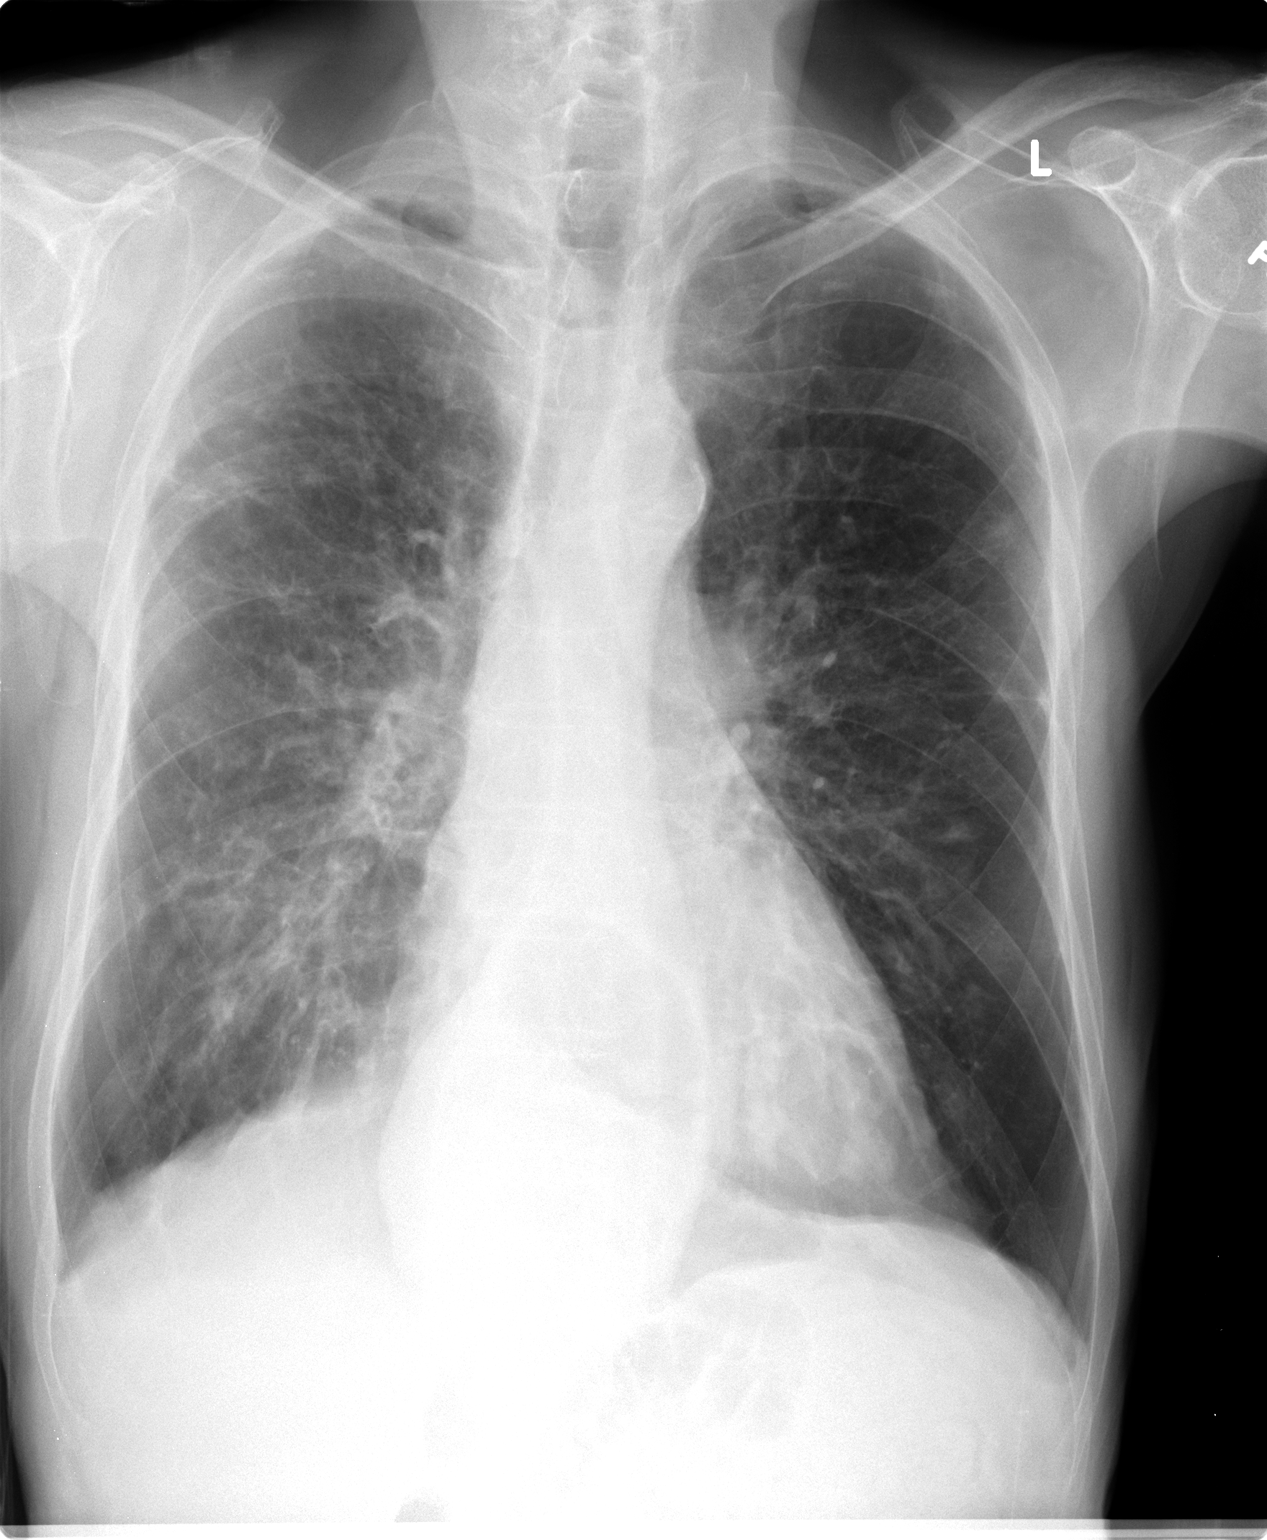

[view not recorded (2 of 2)]
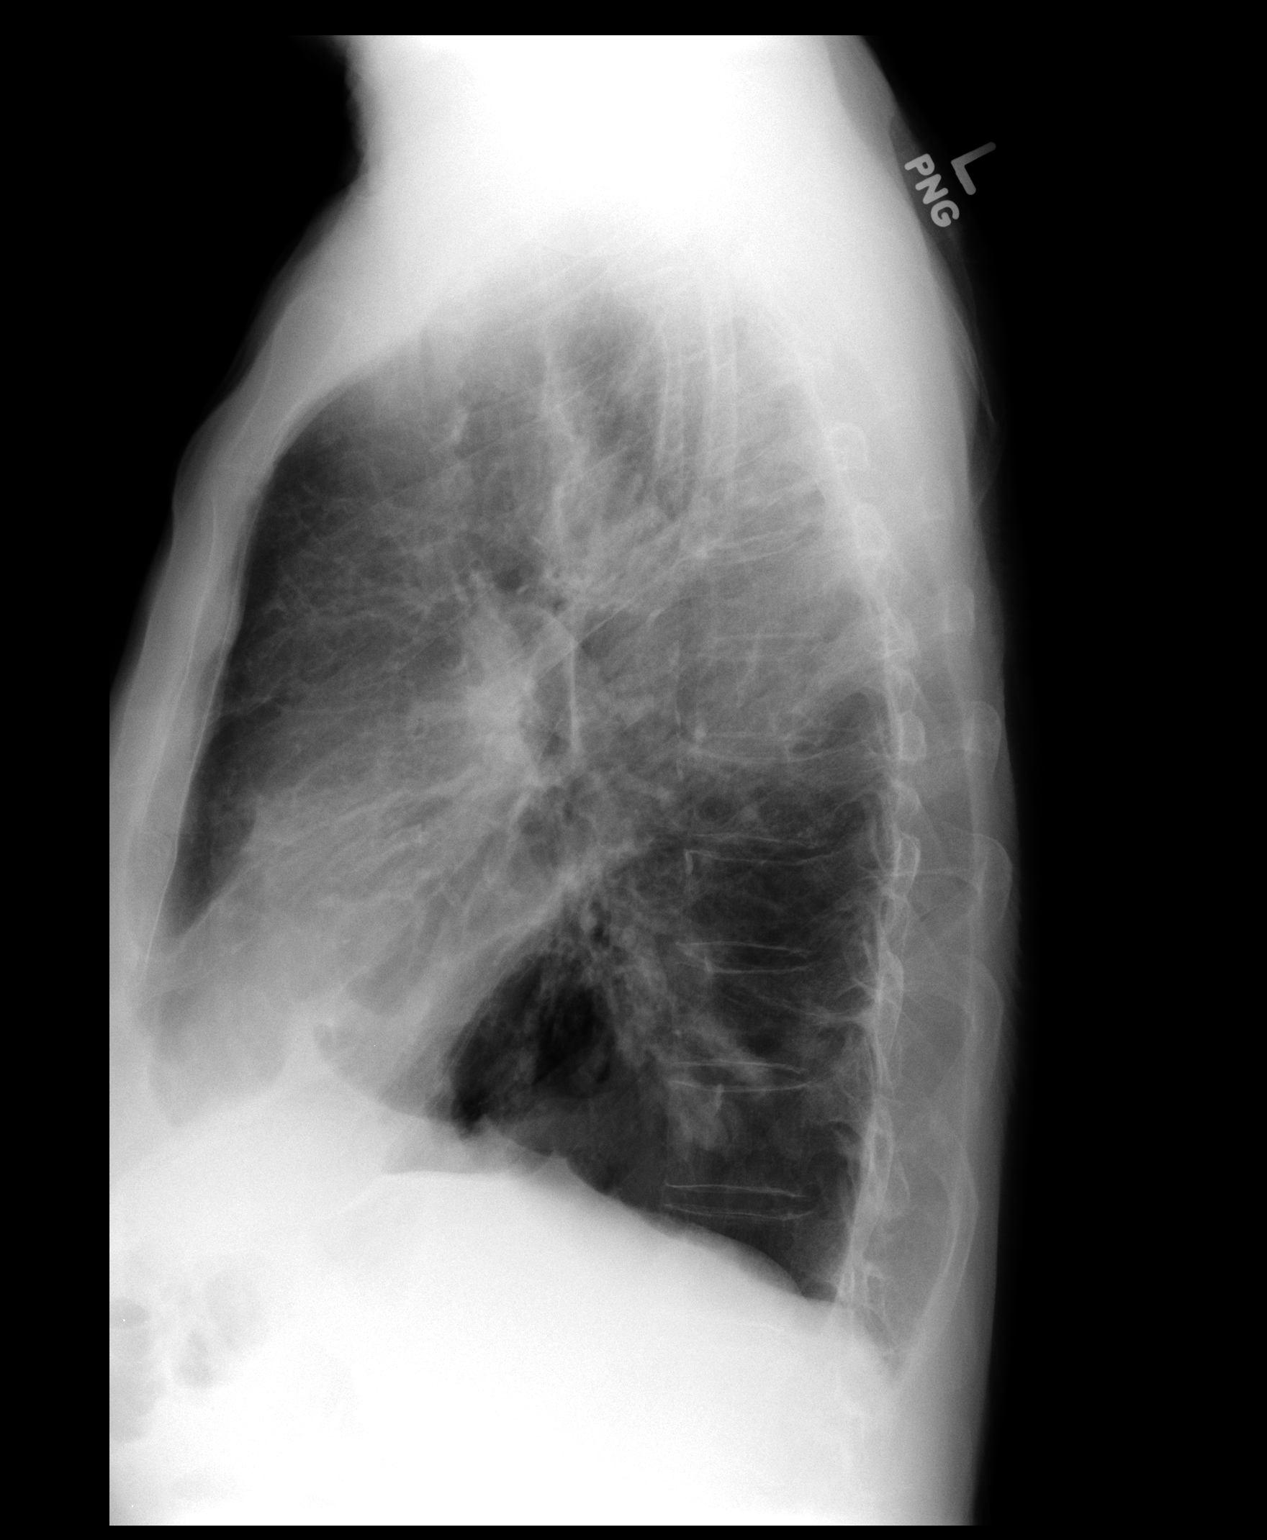

[2 of 2 positions shown; findings below may reference images not displayed]

FINDINGS: The lungs are hyperinflated with hemidiaphragm flattening. The
interstitial markings are increased bilaterally but are slightly
less conspicuous than on the previous study. There is a
moderate-sized hiatal hernia. The heart and pulmonary vascularity
are normal. There is no pleural effusion. The bony thorax exhibits
no acute abnormality.
IMPRESSION: 1. Stable hyperinflation and parenchymal fibrotic changes. There is
no acute cardiopulmonary abnormality.
2. Moderate-sized hiatal hernia.

## 2015-11-24 ENCOUNTER — Encounter (HOSPITAL_COMMUNITY)
Admission: RE | Admit: 2015-11-24 | Discharge: 2015-11-24 | Disposition: A | Discharge status: Home / Self Care | Payer: Medicare Other | Source: Ambulatory Visit | Attending: Nephrology | Admitting: Nephrology

## 2015-11-24 DIAGNOSIS — N183 Chronic kidney disease, stage 3 (moderate): Secondary | ICD-10-CM | POA: Diagnosis not present

## 2015-11-24 LAB — POCT HEMOGLOBIN-HEMACUE: HEMOGLOBIN: 11.4 g/dL — AB (ref 13.0–17.0)

## 2015-11-24 MED ORDER — EPOETIN ALFA 40000 UNIT/ML IJ SOLN: 30000.0000 [IU] | INTRAMUSCULAR | Status: DC

## 2015-11-24 MED ORDER — EPOETIN ALFA 20000 UNIT/ML IJ SOLN
INTRAMUSCULAR | Status: AC
  Administered 2015-11-24: 20000 [IU]
  Filled 2015-11-24: qty 1

## 2015-11-24 MED ORDER — EPOETIN ALFA 10000 UNIT/ML IJ SOLN
INTRAMUSCULAR | Status: AC
  Administered 2015-11-24: 10000 [IU]
  Filled 2015-11-24: qty 1

## 2015-11-28 IMAGING — CR DG CHEST 2V
2 series · 2 of 2 positions shown · non-contrast
Comparison: None.

CLINICAL DATA: Fever last night

EXAM:
CHEST  2 VIEW

[w chest pa]
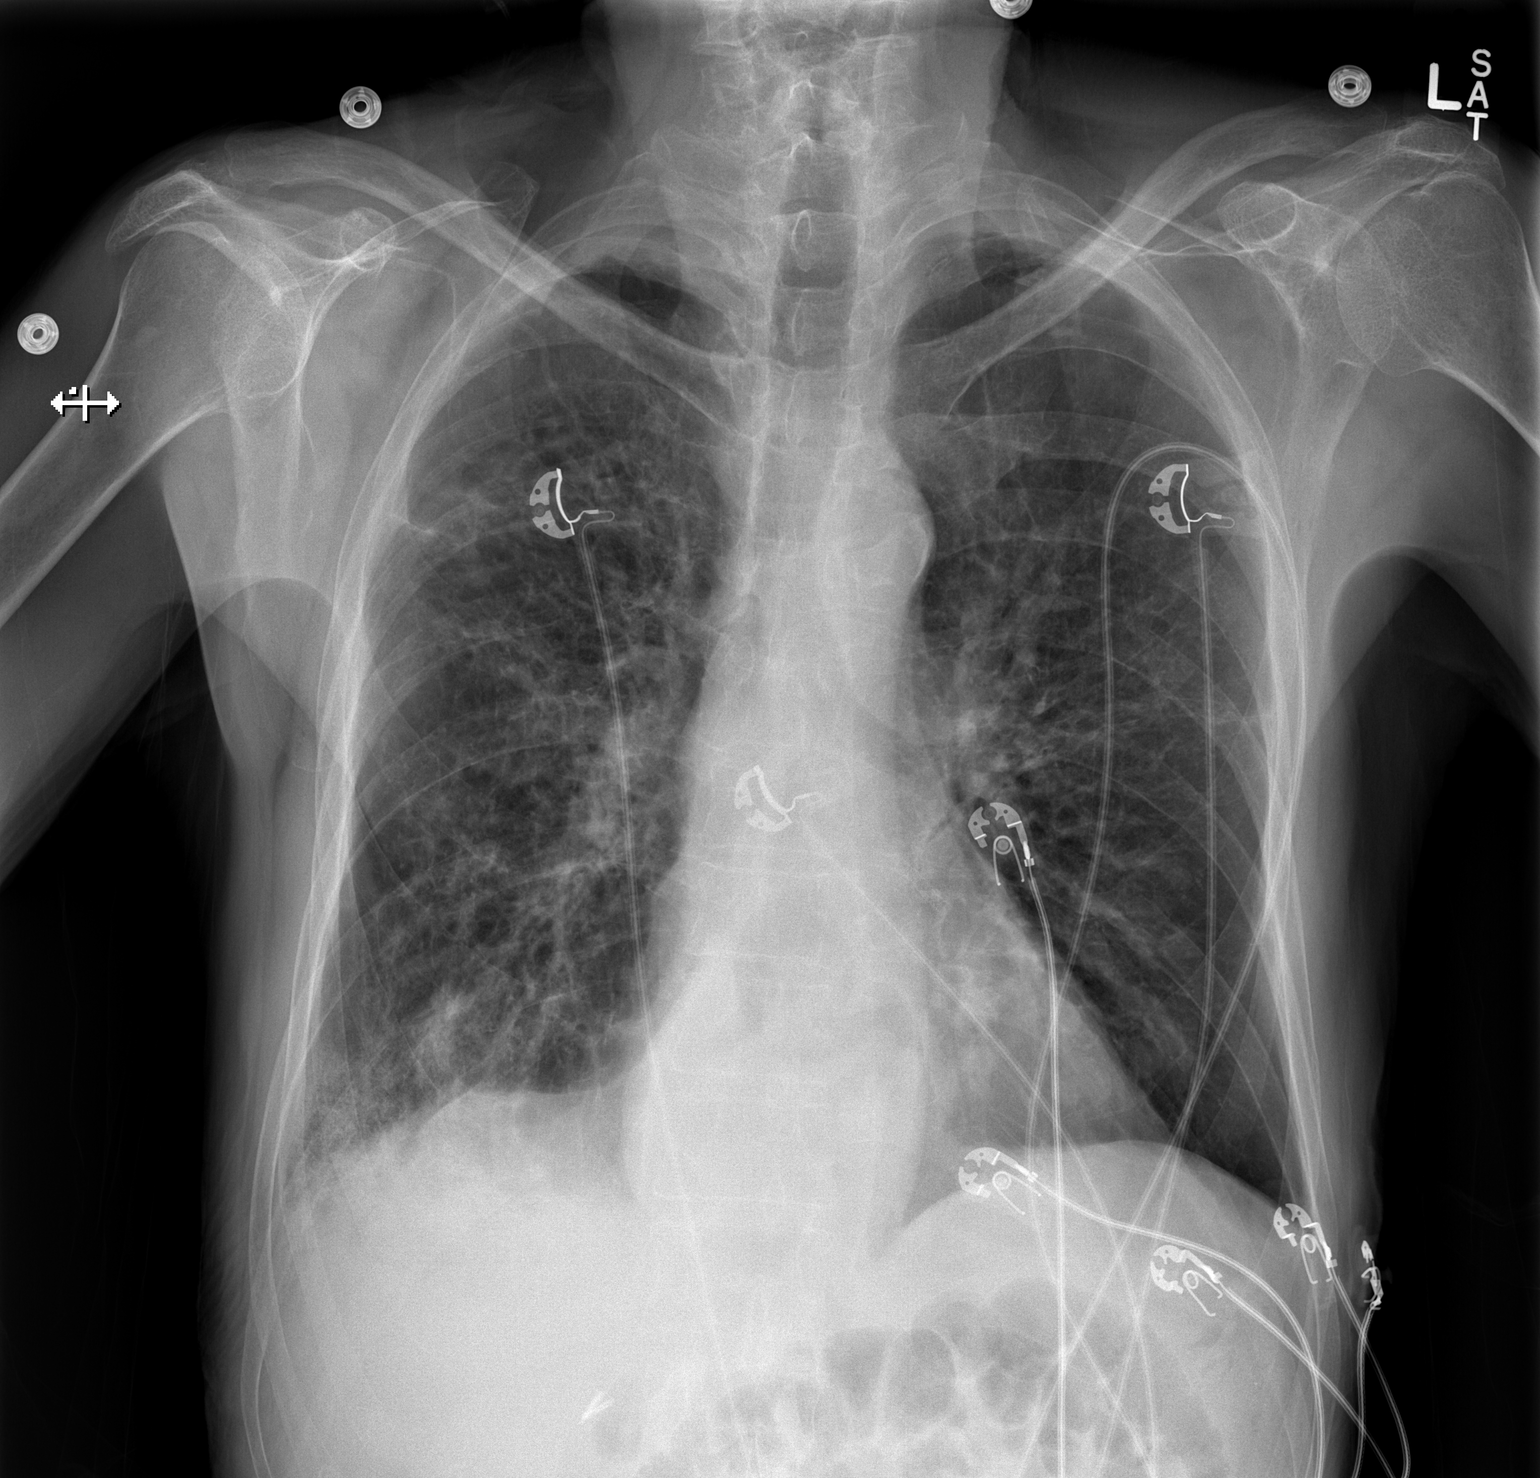

[w chest lat]
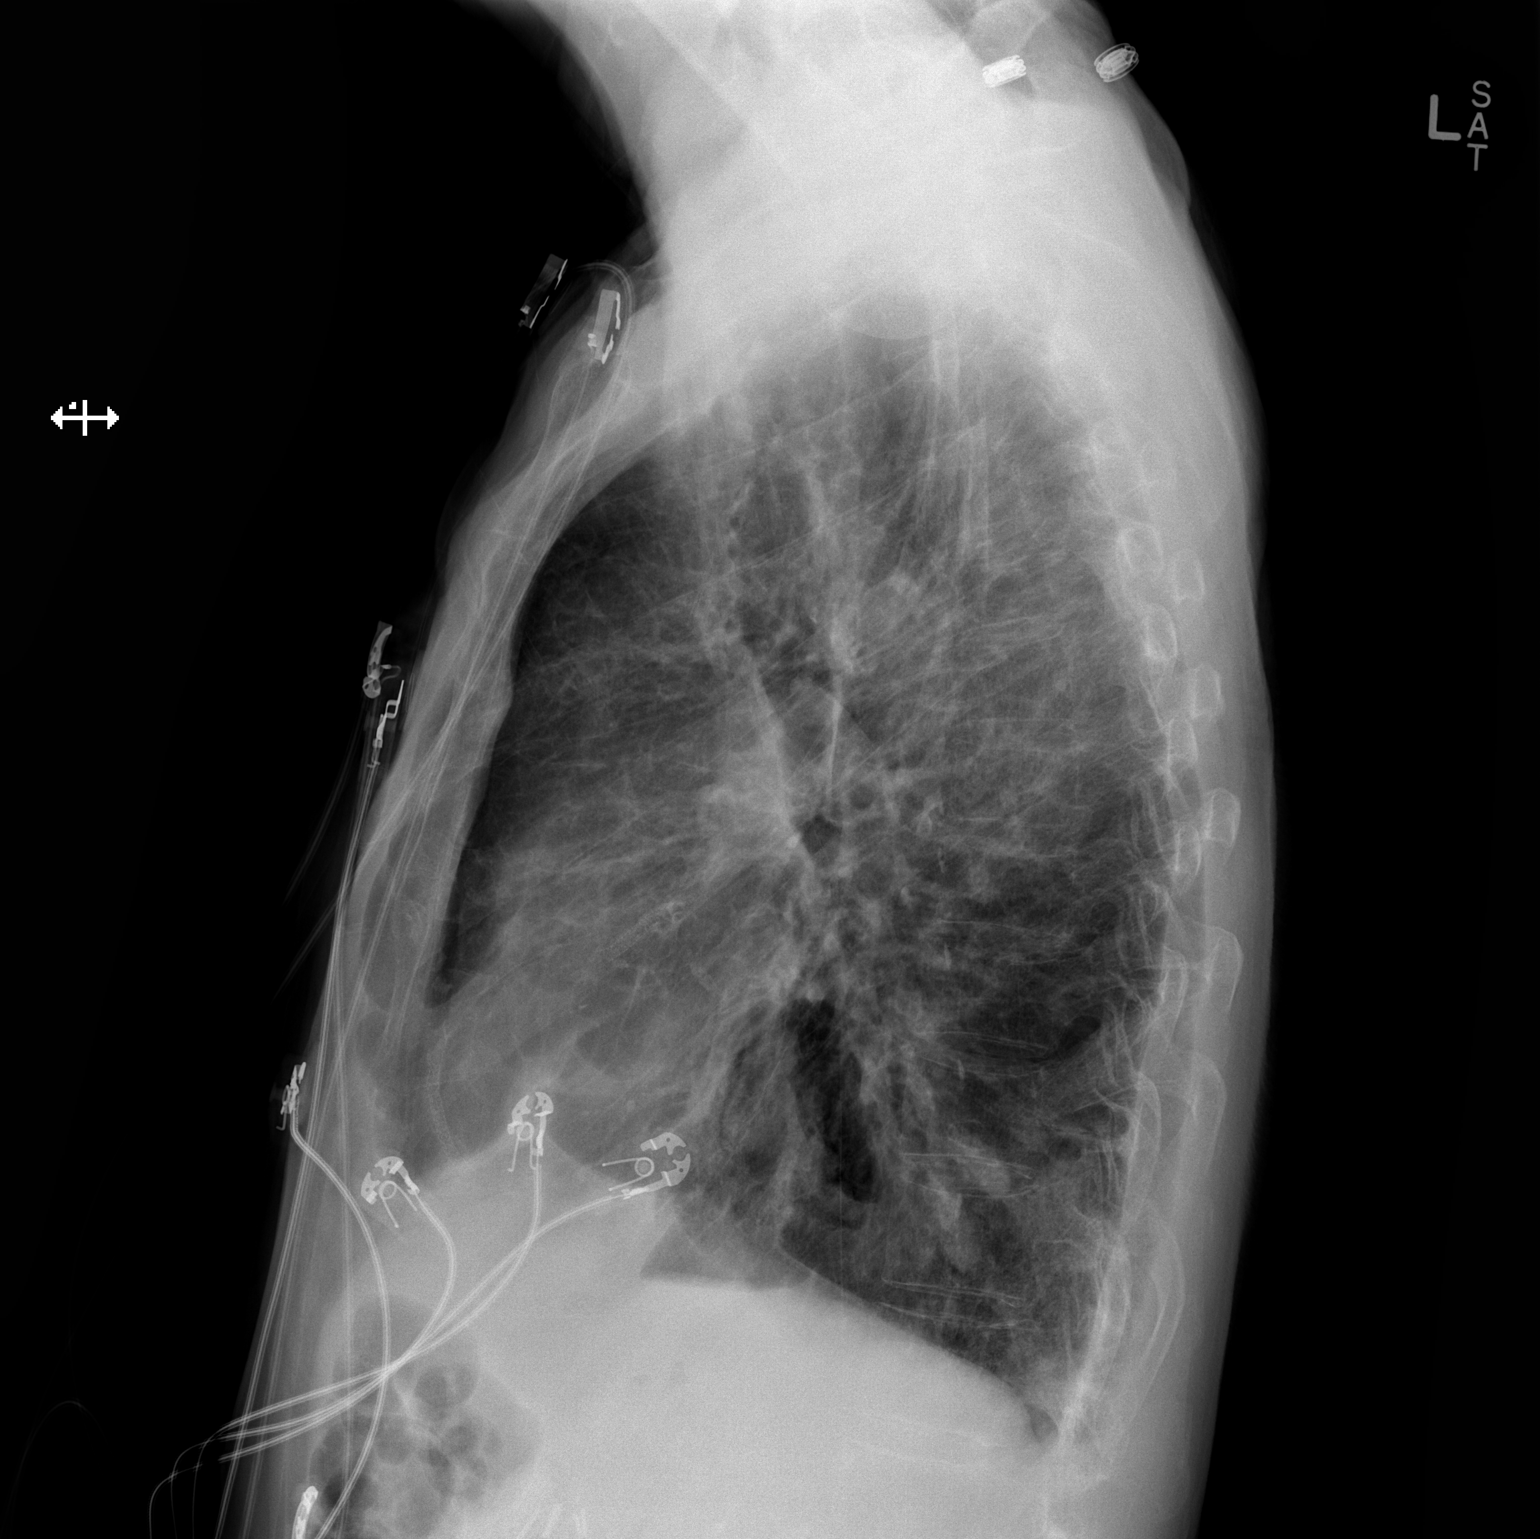

[2 of 2 positions shown; findings below may reference images not displayed]

FINDINGS: There are fibrotic changes throughout the lungs. New patchy airspace
opacities have developed at the right lung base. Hiatal hernia is
noted. Upper normal heart size.
IMPRESSION: New right basilar airspace disease. Followup PA and lateral chest
X-ray is recommended in 3-4 weeks following trial of antibiotic
therapy to ensure resolution and exclude underlying malignancy.

## 2015-11-29 ENCOUNTER — Other Ambulatory Visit: Payer: Self-pay | Admitting: Family Medicine

## 2015-11-30 ENCOUNTER — Telehealth: Payer: Self-pay | Admitting: Family Medicine

## 2015-11-30 NOTE — Telephone Encounter (Signed)
Per Dr. Sarajane Jews he is aware of possible drug interactions, pt has been on these medications in the past. I spoke with pharmacy and gave the okay to fill script.

## 2015-11-30 NOTE — Telephone Encounter (Signed)
Becky from [facility] call to say that the following med clarithromycin (BIAXIN) 500 MG tablet will inter act with simvastatin (ZOCOR) 20 MG tablet , tamsulosin (FLOMAX) 0.4 MG CAPS capsule, will cause hypo  Pharmacy spoke with the pt  And said if you want to rx this med she will need a call back with the approval    [phone]

## 2015-11-30 NOTE — Telephone Encounter (Signed)
Per Dr. Fry okay to refill 

## 2015-12-01 ENCOUNTER — Inpatient Hospital Stay (HOSPITAL_COMMUNITY): Admission: RE | Admit: 2015-12-01 | Payer: Medicare Other | Source: Ambulatory Visit

## 2015-12-04 ENCOUNTER — Encounter (HOSPITAL_COMMUNITY)
Admission: RE | Admit: 2015-12-04 | Discharge: 2015-12-04 | Disposition: A | Discharge status: Home / Self Care | Payer: Medicare Other | Source: Ambulatory Visit | Attending: Nephrology | Admitting: Nephrology

## 2015-12-04 DIAGNOSIS — N183 Chronic kidney disease, stage 3 (moderate): Secondary | ICD-10-CM | POA: Diagnosis not present

## 2015-12-04 LAB — POCT HEMOGLOBIN-HEMACUE: HEMOGLOBIN: 10.9 g/dL — AB (ref 13.0–17.0)

## 2015-12-04 MED ORDER — EPOETIN ALFA 20000 UNIT/ML IJ SOLN
INTRAMUSCULAR | Status: AC
  Administered 2015-12-04: 20000 [IU]
  Filled 2015-12-04: qty 1

## 2015-12-04 MED ORDER — EPOETIN ALFA 10000 UNIT/ML IJ SOLN
INTRAMUSCULAR | Status: AC
  Administered 2015-12-04: 10000 [IU]
  Filled 2015-12-04: qty 1

## 2015-12-04 MED ORDER — EPOETIN ALFA 40000 UNIT/ML IJ SOLN
30000.0000 [IU] | INTRAMUSCULAR | Status: DC
Start: 2015-12-04 — End: 2015-12-05

## 2015-12-06 ENCOUNTER — Other Ambulatory Visit: Payer: Self-pay | Admitting: Family Medicine

## 2015-12-10 ENCOUNTER — Encounter (HOSPITAL_COMMUNITY)
Admission: RE | Admit: 2015-12-10 | Discharge: 2015-12-10 | Disposition: A | Discharge status: Home / Self Care | Payer: Medicare Other | Source: Ambulatory Visit | Attending: Nephrology | Admitting: Nephrology

## 2015-12-10 DIAGNOSIS — Z5181 Encounter for therapeutic drug level monitoring: Secondary | ICD-10-CM | POA: Insufficient documentation

## 2015-12-10 DIAGNOSIS — D631 Anemia in chronic kidney disease: Secondary | ICD-10-CM | POA: Diagnosis not present

## 2015-12-10 DIAGNOSIS — N183 Chronic kidney disease, stage 3 (moderate): Secondary | ICD-10-CM | POA: Diagnosis present

## 2015-12-10 DIAGNOSIS — Z79899 Other long term (current) drug therapy: Secondary | ICD-10-CM | POA: Insufficient documentation

## 2015-12-10 LAB — POCT HEMOGLOBIN-HEMACUE: HEMOGLOBIN: 9.4 g/dL — AB (ref 13.0–17.0)

## 2015-12-10 MED ORDER — EPOETIN ALFA 40000 UNIT/ML IJ SOLN
INTRAMUSCULAR | Status: AC
  Administered 2015-12-10: 30000 [IU] via SUBCUTANEOUS
  Filled 2015-12-10: qty 1

## 2015-12-10 MED ORDER — EPOETIN ALFA 40000 UNIT/ML IJ SOLN
30000.0000 [IU] | INTRAMUSCULAR | Status: DC
  Administered 2015-12-10: 30000 [IU] via SUBCUTANEOUS

## 2015-12-16 ENCOUNTER — Encounter (HOSPITAL_COMMUNITY): Payer: Medicare Other

## 2015-12-17 ENCOUNTER — Encounter (HOSPITAL_COMMUNITY): Payer: Medicare Other

## 2015-12-18 ENCOUNTER — Encounter (HOSPITAL_COMMUNITY)
Admission: RE | Admit: 2015-12-18 | Discharge: 2015-12-18 | Disposition: A | Discharge status: Home / Self Care | Payer: Medicare Other | Source: Ambulatory Visit | Attending: Nephrology | Admitting: Nephrology

## 2015-12-18 DIAGNOSIS — N183 Chronic kidney disease, stage 3 (moderate): Secondary | ICD-10-CM | POA: Diagnosis not present

## 2015-12-18 LAB — FERRITIN: FERRITIN: 196 ng/mL (ref 24–336)

## 2015-12-18 LAB — POCT HEMOGLOBIN-HEMACUE: Hemoglobin: 10.2 g/dL — ABNORMAL LOW (ref 13.0–17.0)

## 2015-12-18 LAB — IRON AND TIBC
IRON: 86 ug/dL (ref 45–182)
SATURATION RATIOS: 44 % — AB (ref 17.9–39.5)
TIBC: 197 ug/dL — ABNORMAL LOW (ref 250–450)
UIBC: 111 ug/dL

## 2015-12-18 MED ORDER — EPOETIN ALFA 20000 UNIT/ML IJ SOLN
INTRAMUSCULAR | Status: AC
  Administered 2015-12-18: 20000 [IU]
  Filled 2015-12-18: qty 1

## 2015-12-18 MED ORDER — EPOETIN ALFA 40000 UNIT/ML IJ SOLN: 30000.0000 [IU] | INTRAMUSCULAR | Status: DC

## 2015-12-18 MED ORDER — EPOETIN ALFA 10000 UNIT/ML IJ SOLN
INTRAMUSCULAR | Status: AC
  Administered 2015-12-18: 10000 [IU]
  Filled 2015-12-18: qty 1

## 2015-12-24 ENCOUNTER — Encounter (HOSPITAL_COMMUNITY)
Admission: RE | Admit: 2015-12-24 | Discharge: 2015-12-24 | Disposition: A | Discharge status: Home / Self Care | Payer: Medicare Other | Source: Ambulatory Visit | Attending: Nephrology | Admitting: Nephrology

## 2015-12-24 DIAGNOSIS — N183 Chronic kidney disease, stage 3 (moderate): Secondary | ICD-10-CM | POA: Diagnosis not present

## 2015-12-24 LAB — POCT HEMOGLOBIN-HEMACUE: Hemoglobin: 10.7 g/dL — ABNORMAL LOW (ref 13.0–17.0)

## 2015-12-24 MED ORDER — EPOETIN ALFA 10000 UNIT/ML IJ SOLN
INTRAMUSCULAR | Status: AC
  Administered 2015-12-24: 10000 [IU] via SUBCUTANEOUS
  Filled 2015-12-24: qty 1

## 2015-12-24 MED ORDER — EPOETIN ALFA 20000 UNIT/ML IJ SOLN
INTRAMUSCULAR | Status: AC
  Administered 2015-12-24: 20000 [IU] via SUBCUTANEOUS
  Filled 2015-12-24: qty 1

## 2015-12-24 MED ORDER — EPOETIN ALFA 40000 UNIT/ML IJ SOLN: 30000.0000 [IU] | INTRAMUSCULAR | Status: DC

## 2015-12-29 IMAGING — DX DG CHEST 2V
2 series · 2 of 2 positions shown · non-contrast
Comparison: PA and lateral chest x-ray April 18, 2015

CLINICAL DATA: Follow-up of pneumonia, history of Paulus N
granulomatosis, bronchitis, persistent shortness of breath

EXAM:
CHEST  2 VIEW

[chest pa]
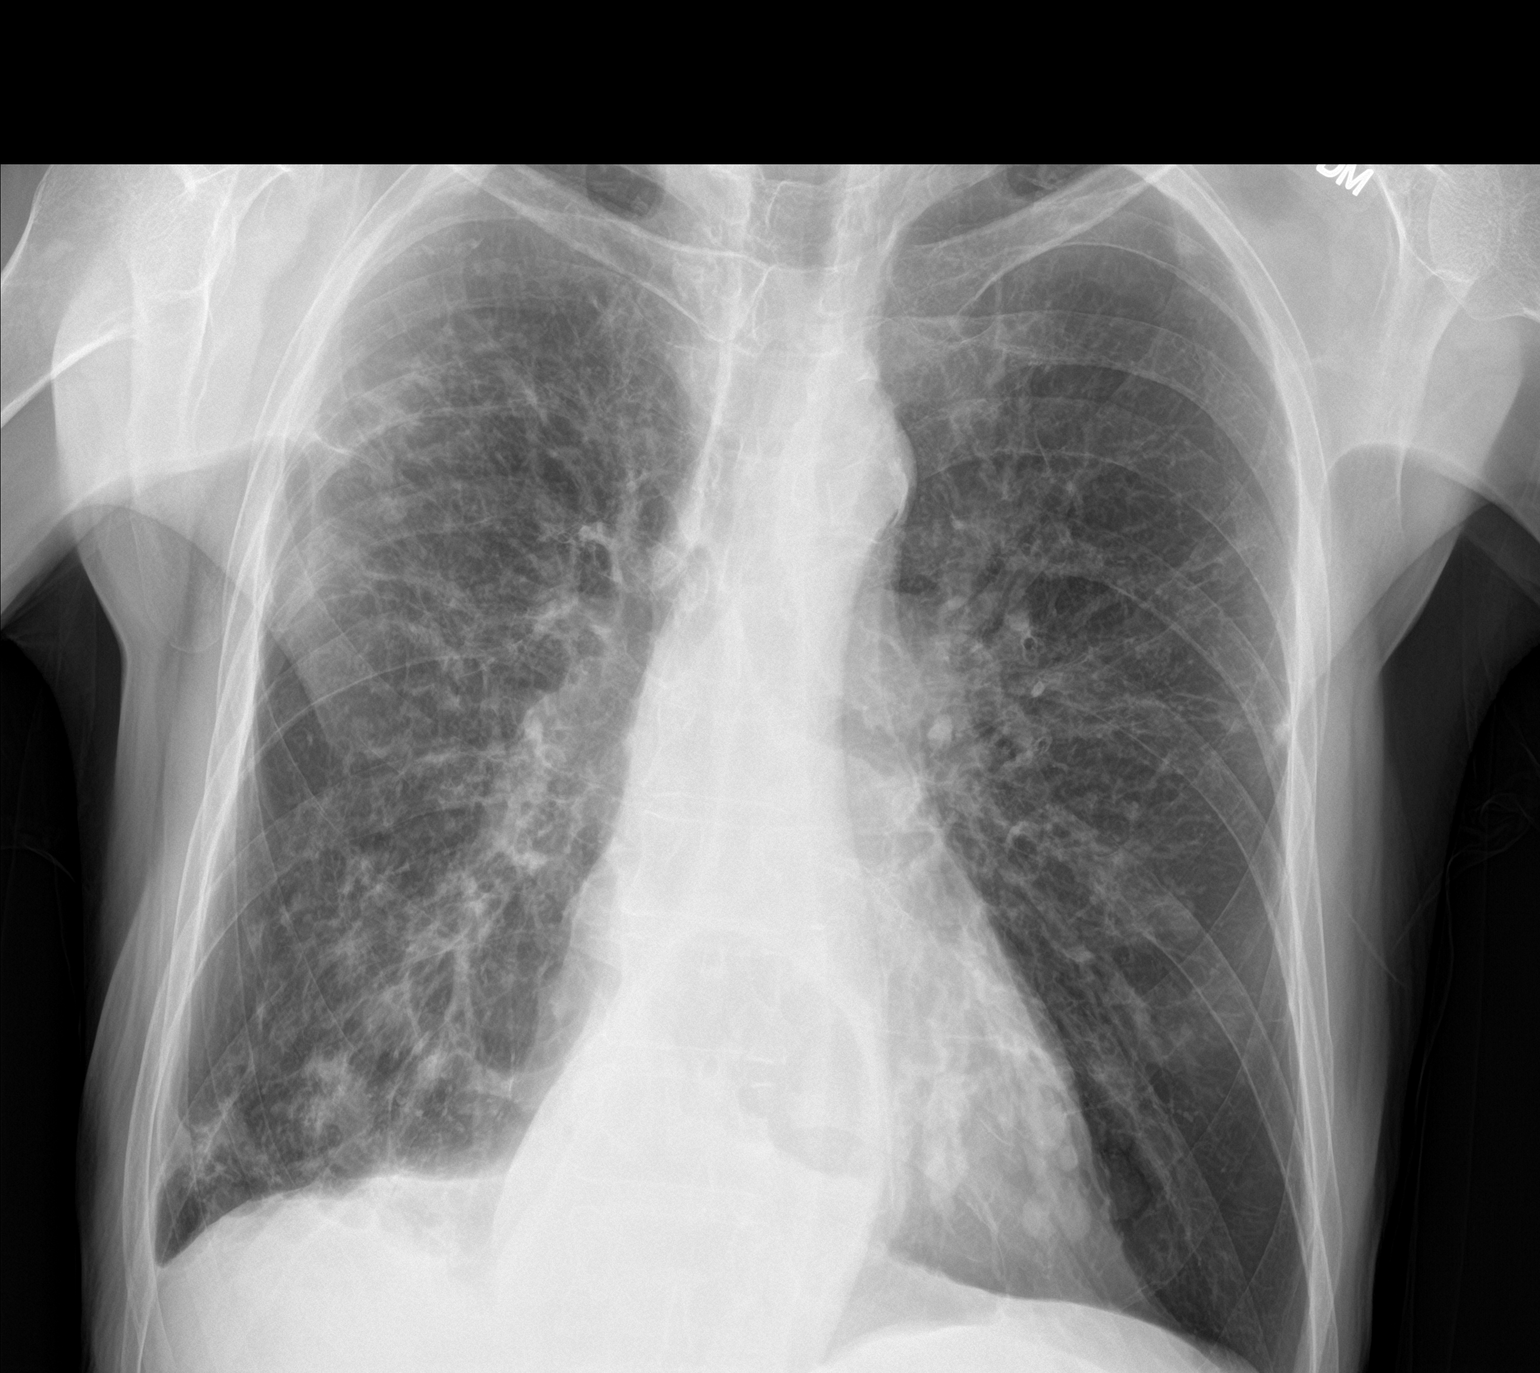

[chest lat]
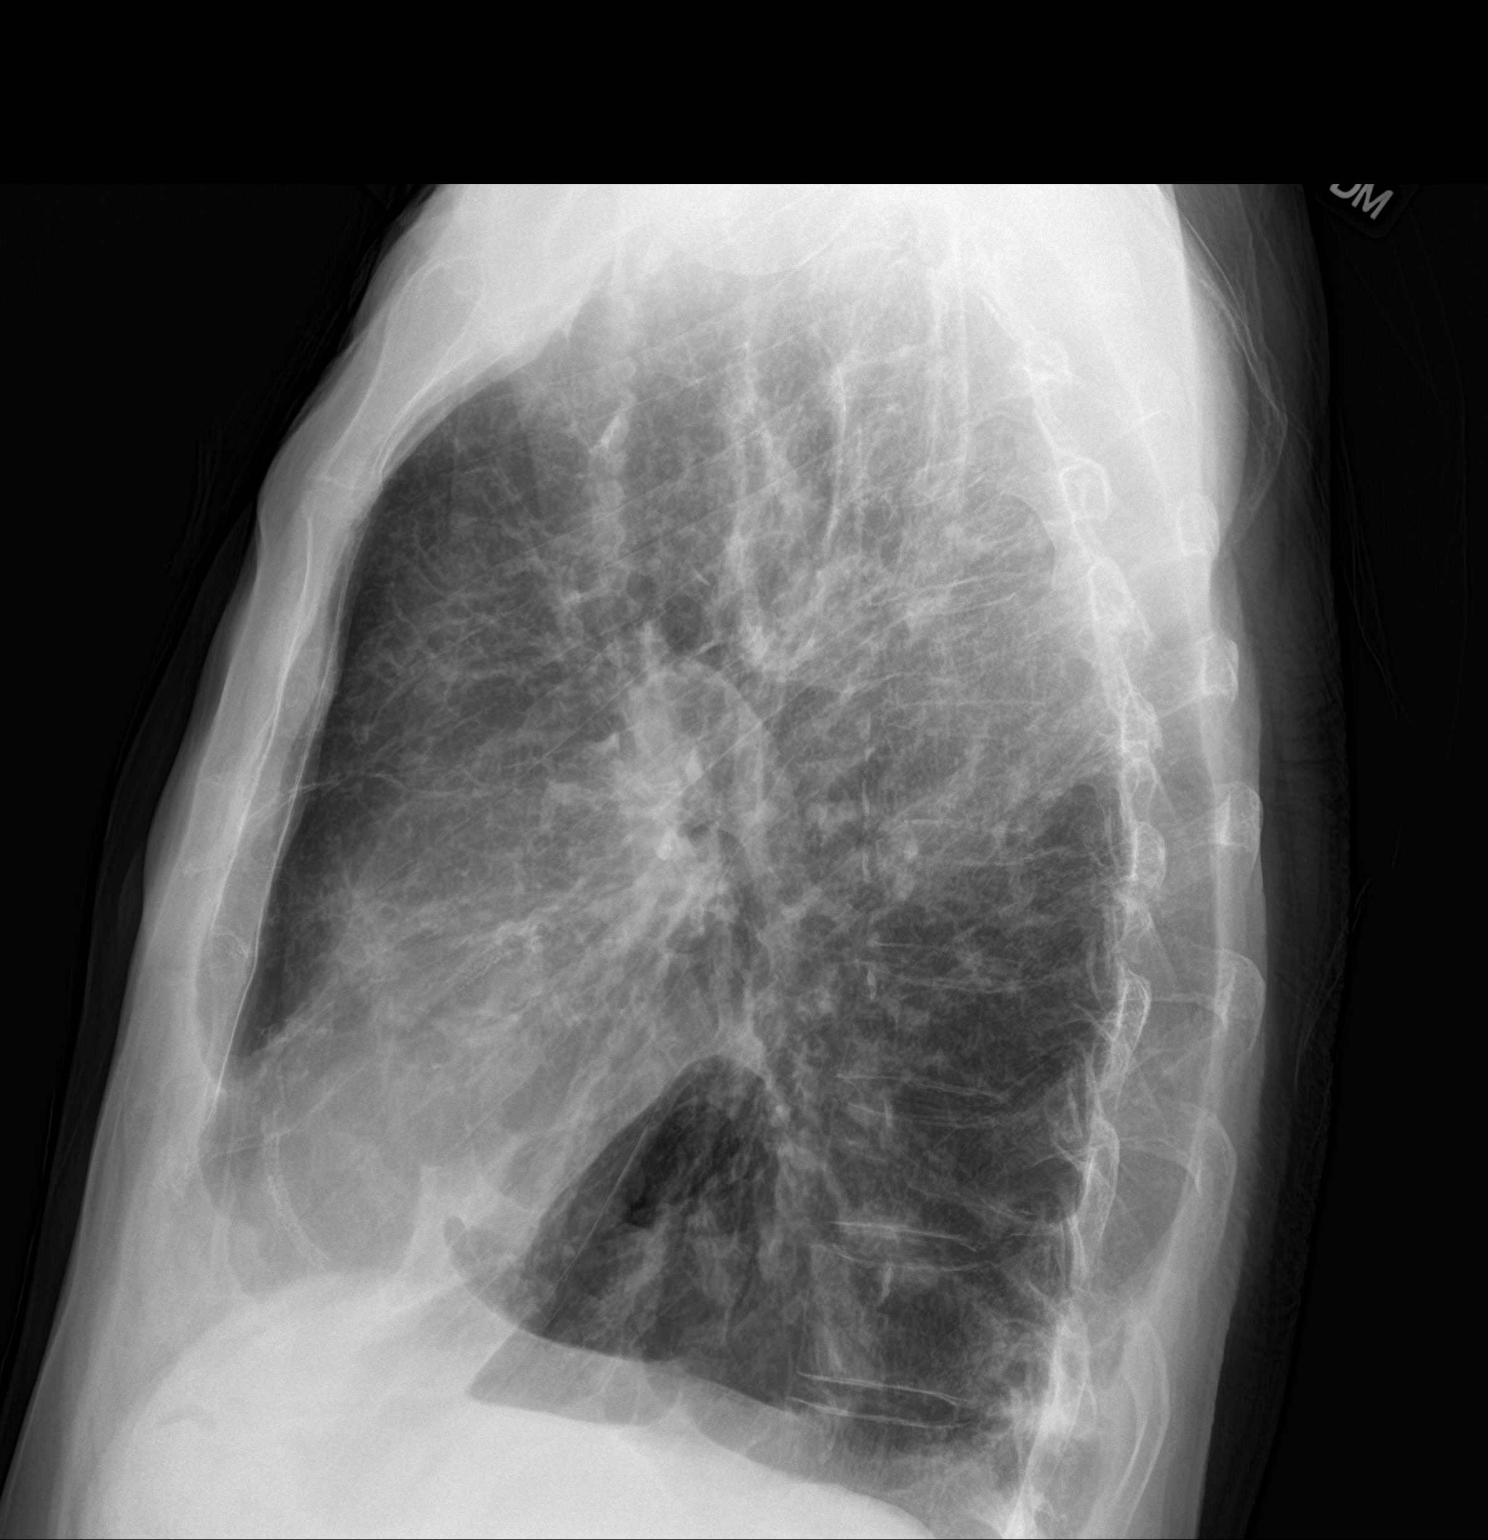

[2 of 2 positions shown; findings below may reference images not displayed]

FINDINGS: The lungs are hyperinflated with hemidiaphragm flattening. The
interstitial markings are coarse bilaterally but are stable.
Bronchiectatic changes in the lower lobes are present. There is a
large hiatal hernia with an air-fluid level. The heart is normal in
size. The pulmonary vascularity is not engorged. There is no pleural
effusion. The bony thorax exhibits no acute abnormality.
IMPRESSION: 1. Chronic hyperinflation with pulmonary fibrotic changes and
bronchiectasis. There is no alveolar pneumonia. There is persistent
right basilar increased density.
2. There is no cardiomegaly or pulmonary edema.
3. Large hiatal hernia.
4. Given the persistent findings at the right lung base, chest CT
scanning is recommended.

## 2015-12-30 ENCOUNTER — Encounter (HOSPITAL_COMMUNITY)
Admission: RE | Admit: 2015-12-30 | Discharge: 2015-12-30 | Disposition: A | Discharge status: Home / Self Care | Payer: Medicare Other | Source: Ambulatory Visit | Attending: Nephrology | Admitting: Nephrology

## 2015-12-30 DIAGNOSIS — N183 Chronic kidney disease, stage 3 (moderate): Secondary | ICD-10-CM | POA: Diagnosis not present

## 2015-12-30 MED ORDER — EPOETIN ALFA 40000 UNIT/ML IJ SOLN: 30000.0000 [IU] | INTRAMUSCULAR | Status: DC

## 2015-12-30 MED ORDER — EPOETIN ALFA 20000 UNIT/ML IJ SOLN
INTRAMUSCULAR | Status: AC
  Administered 2015-12-30: 20000 [IU] via SUBCUTANEOUS
  Filled 2015-12-30: qty 1

## 2015-12-30 MED ORDER — EPOETIN ALFA 10000 UNIT/ML IJ SOLN
INTRAMUSCULAR | Status: AC
  Administered 2015-12-30: 10000 [IU] via SUBCUTANEOUS
  Filled 2015-12-30: qty 1

## 2015-12-31 LAB — POCT HEMOGLOBIN-HEMACUE: HEMOGLOBIN: 10.5 g/dL — AB (ref 13.0–17.0)

## 2016-01-06 ENCOUNTER — Encounter (HOSPITAL_COMMUNITY)
Admission: RE | Admit: 2016-01-06 | Discharge: 2016-01-06 | Disposition: A | Discharge status: Home / Self Care | Payer: Medicare Other | Source: Ambulatory Visit | Attending: Nephrology | Admitting: Nephrology

## 2016-01-06 DIAGNOSIS — N183 Chronic kidney disease, stage 3 (moderate): Secondary | ICD-10-CM | POA: Diagnosis not present

## 2016-01-06 LAB — POCT HEMOGLOBIN-HEMACUE: Hemoglobin: 10.6 g/dL — ABNORMAL LOW (ref 13.0–17.0)

## 2016-01-06 MED ORDER — EPOETIN ALFA 10000 UNIT/ML IJ SOLN
INTRAMUSCULAR | Status: AC
  Administered 2016-01-06: 10000 [IU]
  Filled 2016-01-06: qty 1

## 2016-01-06 MED ORDER — EPOETIN ALFA 40000 UNIT/ML IJ SOLN: 30000.0000 [IU] | INTRAMUSCULAR | Status: DC

## 2016-01-06 MED ORDER — EPOETIN ALFA 20000 UNIT/ML IJ SOLN
INTRAMUSCULAR | Status: AC
  Administered 2016-01-06: 20000 [IU]
  Filled 2016-01-06: qty 1

## 2016-01-07 ENCOUNTER — Emergency Department (HOSPITAL_COMMUNITY): Payer: Medicare Other

## 2016-01-07 ENCOUNTER — Encounter (HOSPITAL_COMMUNITY): Payer: Self-pay | Admitting: Emergency Medicine

## 2016-01-07 ENCOUNTER — Emergency Department (HOSPITAL_COMMUNITY)
Admission: EM | Admit: 2016-01-07 | Discharge: 2016-01-07 | Disposition: A | Discharge status: Home / Self Care | Payer: Medicare Other | Attending: Emergency Medicine | Admitting: Emergency Medicine

## 2016-01-07 DIAGNOSIS — I2510 Atherosclerotic heart disease of native coronary artery without angina pectoris: Secondary | ICD-10-CM | POA: Diagnosis not present

## 2016-01-07 DIAGNOSIS — Z7982 Long term (current) use of aspirin: Secondary | ICD-10-CM | POA: Diagnosis not present

## 2016-01-07 DIAGNOSIS — N184 Chronic kidney disease, stage 4 (severe): Secondary | ICD-10-CM | POA: Diagnosis not present

## 2016-01-07 DIAGNOSIS — J45909 Unspecified asthma, uncomplicated: Secondary | ICD-10-CM | POA: Insufficient documentation

## 2016-01-07 DIAGNOSIS — Z79899 Other long term (current) drug therapy: Secondary | ICD-10-CM | POA: Diagnosis not present

## 2016-01-07 DIAGNOSIS — I10 Essential (primary) hypertension: Secondary | ICD-10-CM | POA: Insufficient documentation

## 2016-01-07 DIAGNOSIS — R0602 Shortness of breath: Secondary | ICD-10-CM | POA: Diagnosis present

## 2016-01-07 DIAGNOSIS — E038 Other specified hypothyroidism: Secondary | ICD-10-CM

## 2016-01-07 DIAGNOSIS — M3131 Wegener's granulomatosis with renal involvement: Secondary | ICD-10-CM | POA: Diagnosis present

## 2016-01-07 DIAGNOSIS — Z79891 Long term (current) use of opiate analgesic: Secondary | ICD-10-CM | POA: Diagnosis not present

## 2016-01-07 DIAGNOSIS — Z7952 Long term (current) use of systemic steroids: Secondary | ICD-10-CM | POA: Diagnosis not present

## 2016-01-07 DIAGNOSIS — Z792 Long term (current) use of antibiotics: Secondary | ICD-10-CM | POA: Diagnosis not present

## 2016-01-07 DIAGNOSIS — J471 Bronchiectasis with (acute) exacerbation: Secondary | ICD-10-CM | POA: Diagnosis present

## 2016-01-07 DIAGNOSIS — E039 Hypothyroidism, unspecified: Secondary | ICD-10-CM | POA: Diagnosis present

## 2016-01-07 DIAGNOSIS — E034 Atrophy of thyroid (acquired): Secondary | ICD-10-CM

## 2016-01-07 LAB — URINALYSIS, ROUTINE W REFLEX MICROSCOPIC
Bilirubin Urine: NEGATIVE
GLUCOSE, UA: NEGATIVE mg/dL
Hgb urine dipstick: NEGATIVE
Ketones, ur: NEGATIVE mg/dL
LEUKOCYTES UA: NEGATIVE
NITRITE: NEGATIVE
PH: 7 (ref 5.0–8.0)
Protein, ur: NEGATIVE mg/dL
SPECIFIC GRAVITY, URINE: 1.017 (ref 1.005–1.030)

## 2016-01-07 LAB — COMPREHENSIVE METABOLIC PANEL
ALK PHOS: 76 U/L (ref 38–126)
ALT: 9 U/L — AB (ref 17–63)
AST: 16 U/L (ref 15–41)
Albumin: 3.5 g/dL (ref 3.5–5.0)
Anion gap: 6 (ref 5–15)
BUN: 37 mg/dL — AB (ref 6–20)
CALCIUM: 8.7 mg/dL — AB (ref 8.9–10.3)
CHLORIDE: 108 mmol/L (ref 101–111)
CO2: 25 mmol/L (ref 22–32)
CREATININE: 2.03 mg/dL — AB (ref 0.61–1.24)
GFR, EST AFRICAN AMERICAN: 35 mL/min — AB (ref >60)
GFR, EST NON AFRICAN AMERICAN: 30 mL/min — AB (ref >60)
Glucose, Bld: 142 mg/dL — ABNORMAL HIGH (ref 65–99)
Potassium: 4.3 mmol/L (ref 3.5–5.1)
Sodium: 139 mmol/L (ref 135–145)
Total Bilirubin: 0.8 mg/dL (ref 0.3–1.2)
Total Protein: 5.7 g/dL — ABNORMAL LOW (ref 6.5–8.1)

## 2016-01-07 LAB — CBC WITH DIFFERENTIAL/PLATELET
Basophils Absolute: 0 10*3/uL (ref 0.0–0.1)
Basophils Relative: 0 %
EOS PCT: 1 %
Eosinophils Absolute: 0.1 10*3/uL (ref 0.0–0.7)
HEMATOCRIT: 31.1 % — AB (ref 39.0–52.0)
Hemoglobin: 10.2 g/dL — ABNORMAL LOW (ref 13.0–17.0)
LYMPHS ABS: 0.5 10*3/uL — AB (ref 0.7–4.0)
Lymphocytes Relative: 8 %
MCH: 36.6 pg — ABNORMAL HIGH (ref 26.0–34.0)
MCHC: 32.8 g/dL (ref 30.0–36.0)
MCV: 111.5 fL — AB (ref 78.0–100.0)
MONO ABS: 0.3 10*3/uL (ref 0.1–1.0)
MONOS PCT: 5 %
NEUTROS ABS: 5.3 10*3/uL (ref 1.7–7.7)
Neutrophils Relative %: 86 %
PLATELETS: 162 10*3/uL (ref 150–400)
RBC: 2.79 MIL/uL — ABNORMAL LOW (ref 4.22–5.81)
RDW: 14.9 % (ref 11.5–15.5)
WBC: 6.2 10*3/uL (ref 4.0–10.5)

## 2016-01-07 LAB — BRAIN NATRIURETIC PEPTIDE: B Natriuretic Peptide: 151.7 pg/mL — ABNORMAL HIGH (ref 0.0–100.0)

## 2016-01-07 LAB — I-STAT TROPONIN, ED: TROPONIN I, POC: 0.01 ng/mL (ref 0.00–0.08)

## 2016-01-07 LAB — I-STAT CG4 LACTIC ACID, ED: LACTIC ACID, VENOUS: 1.82 mmol/L (ref 0.5–2.0)

## 2016-01-07 IMAGING — CT CT CHEST W/O CM
2 of 4 series · 15 of 36 positions shown, 18 images · IV contrast (Omnipaque 300)
Comparison: Plain films, including [DATE]. Most recent CT of
05/19/2015.

CLINICAL DATA: Recent pneumonia in [REDACTED]. Hospitalized for
3 days. Persistent productive cough with yellow sputum. History of
recurrent bronchitis. Persistent right lower lobe density on chest
radiograph

EXAM:
CT CHEST WITHOUT CONTRAST
TECHNIQUE: Multidetector CT imaging of the chest was performed following the
standard protocol without IV contrast.

[Series 2: chest routine with · axial · 0.64mm/px · z∈[-288,-24]mm · 12 of 63 slices shown, 15 images]
[im 5/63  mediastinal]
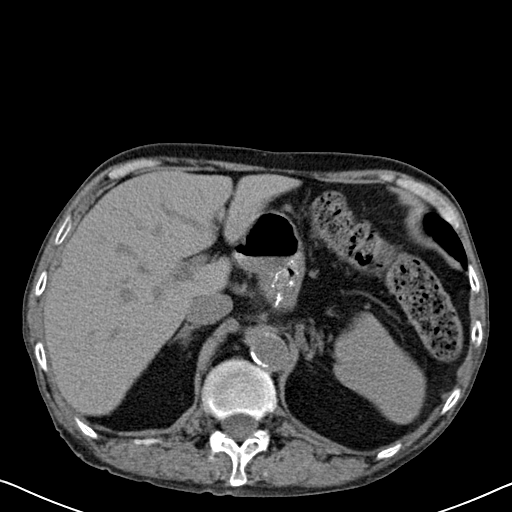
[im 5/63  lung]
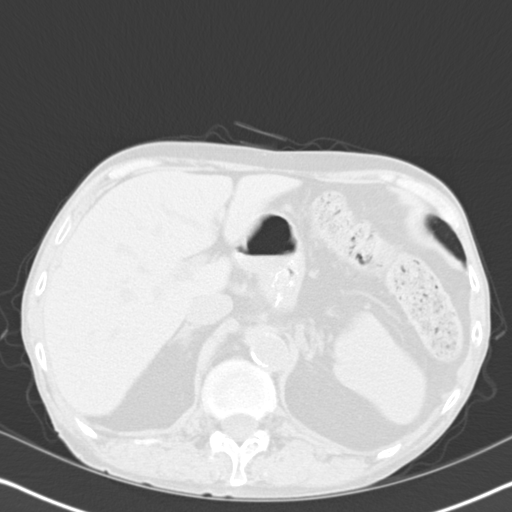
[im 10/63  lung]
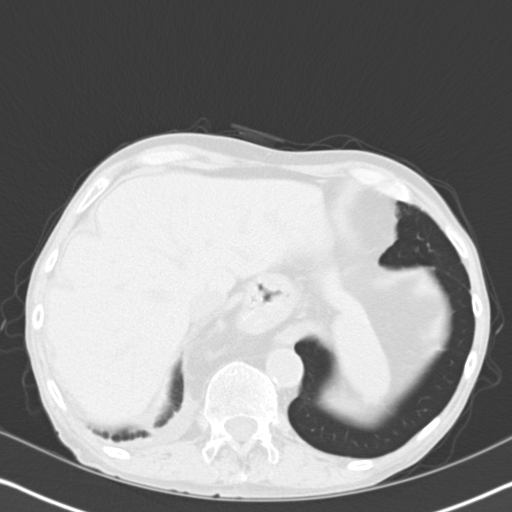
[im 15/63  lung]
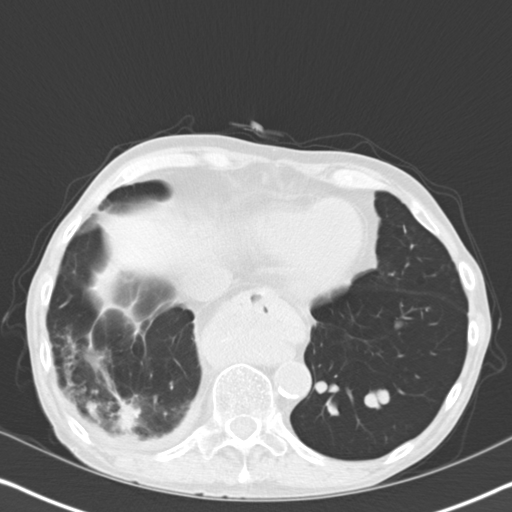
[im 20/63  lung]
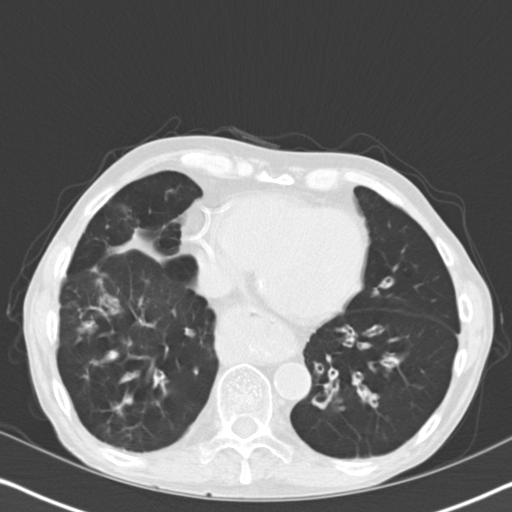
[im 24/63  mediastinal]
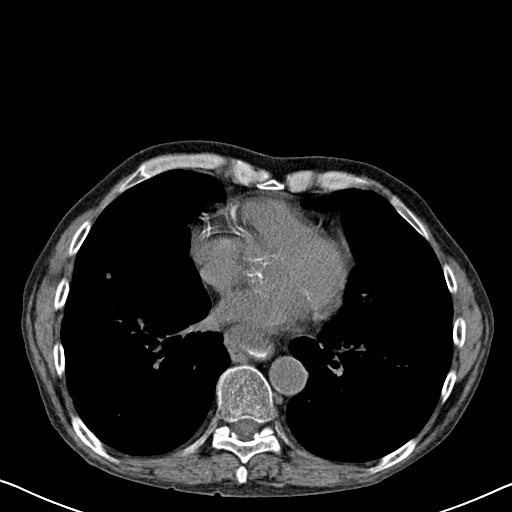
[im 24/63  lung]
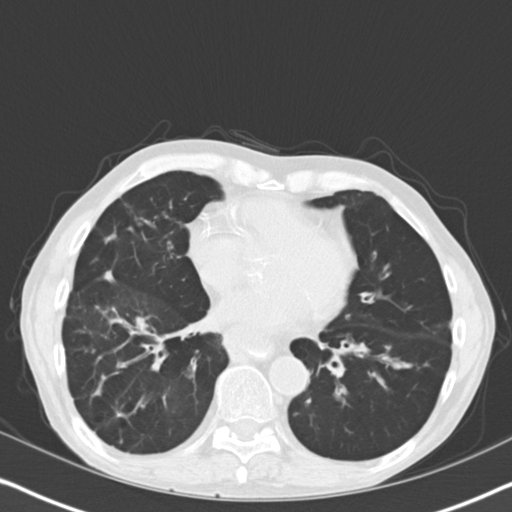
[im 29/63  lung]
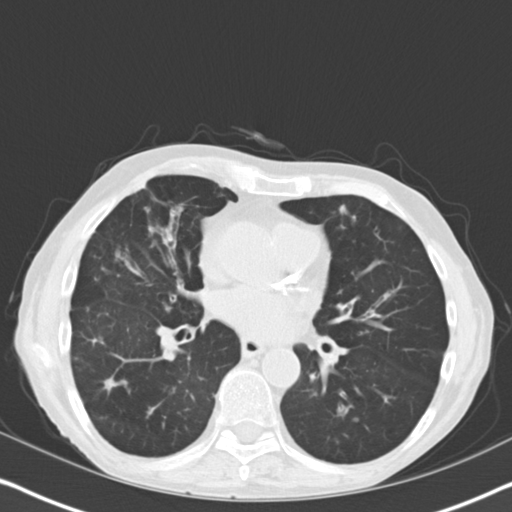
[im 34/63  lung]
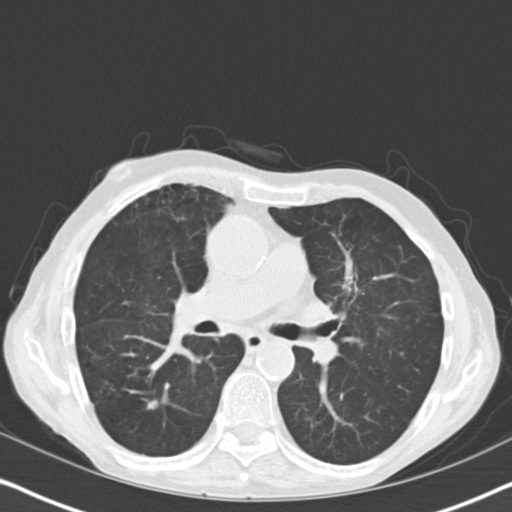
[im 39/63  lung]
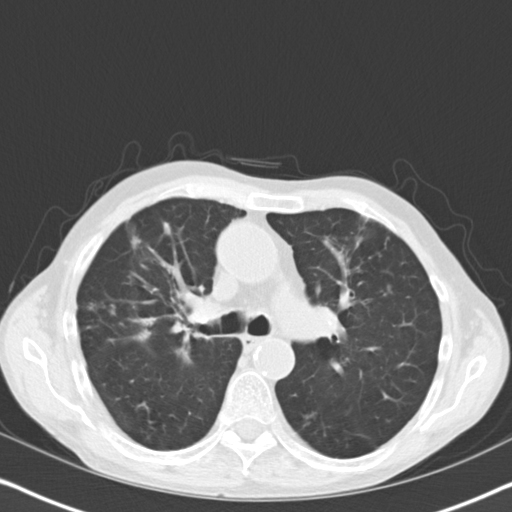
[im 43/63  mediastinal]
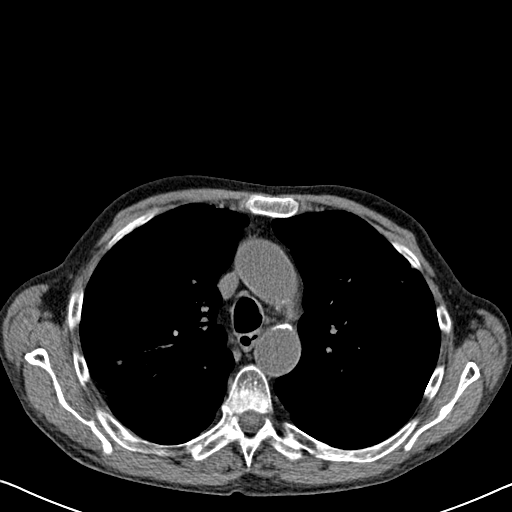
[im 43/63  lung]
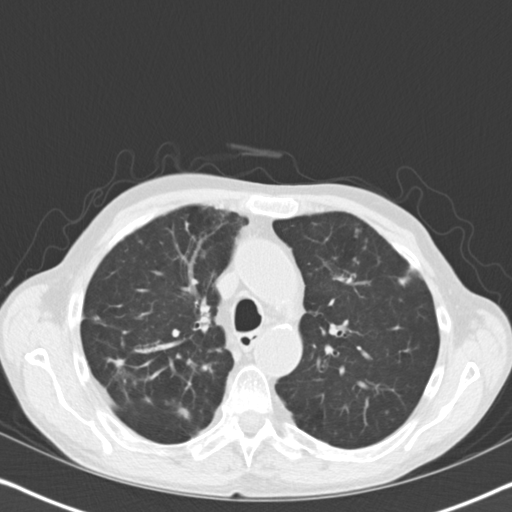
[im 48/63  lung]
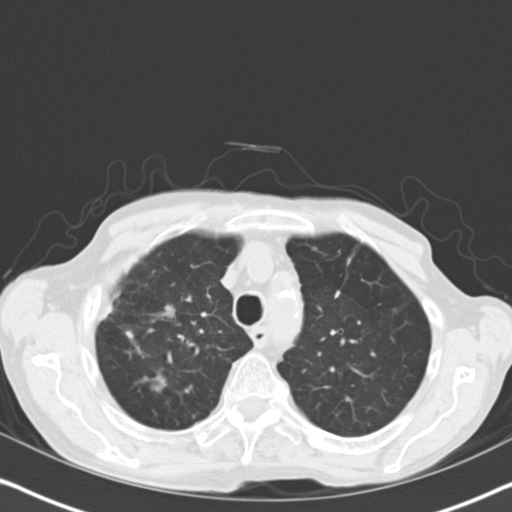
[im 53/63  lung]
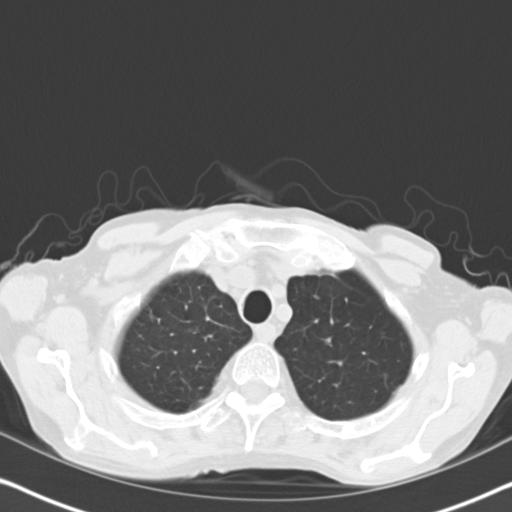
[im 58/63  lung]
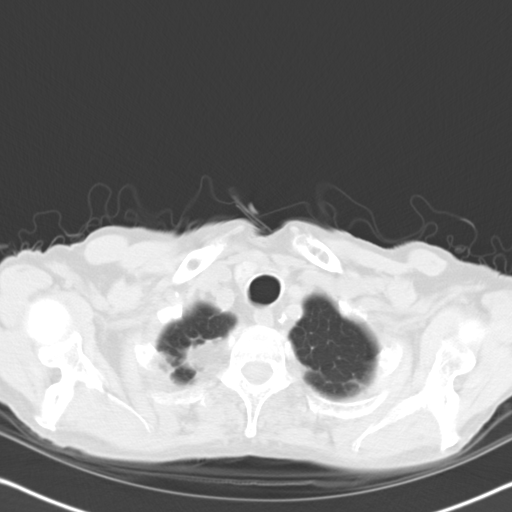

[Series 602: cor · coronal · 0.64mm/px · 3 of 101 slices shown]
[im 21/101  lung]
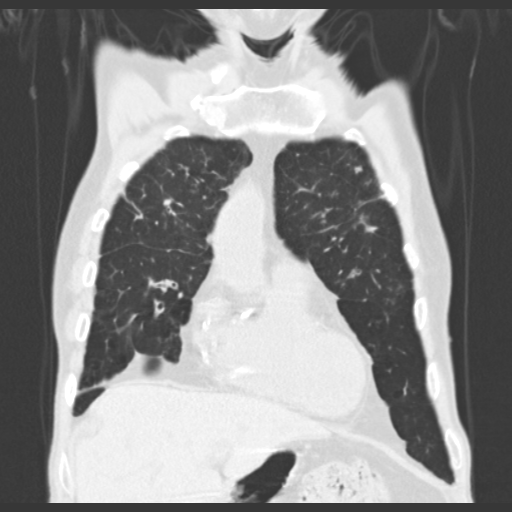
[im 41/101  lung]
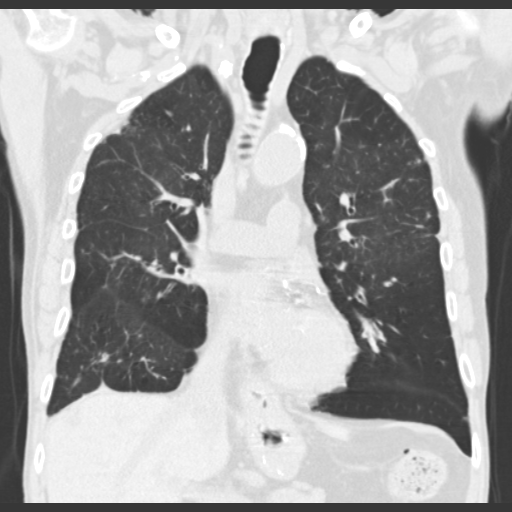
[im 61/101  lung]
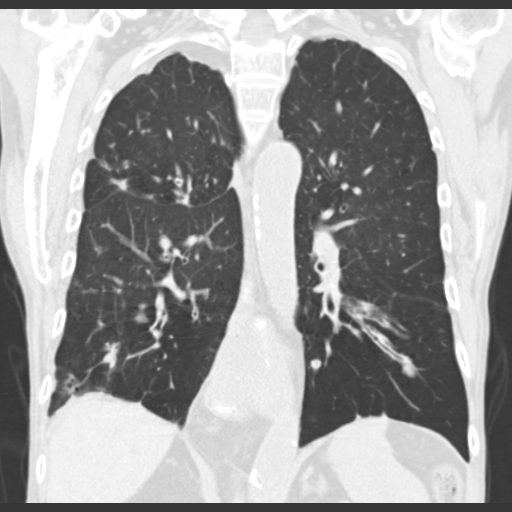

[15 of 36 positions shown; findings below may reference images not displayed]

FINDINGS: Mediastinum/Nodes: Bilateral mild gynecomastia. Aortic and branch
vessel atherosclerosis. Tortuous descending thoracic aorta. Mild
cardiomegaly with advanced multivessel coronary artery
atherosclerosis.

No mediastinal or definite hilar adenopathy, given limitations of
unenhanced CT. A moderate hiatal hernia, with nearly 03/23/2014 of the
stomach positioned within the chest.

Lungs/Pleura: Trace right pleural fluid, decreased since the prior
CT. Moderate lower lobe predominant bronchial wall thickening.
Bronchiectasis with areas of lower lobe predominant mucous filled
bronchi. Similar to on the prior CT.

Right basilar irregular opacity, including at 2 cm on image/series
[DATE] is favored to relate to the same process, but is new since
[DATE] and 10/04/2013. Upper lobe predominant
Peribronchovascular interstitial opacities are slightly more
well-defined than on 03/23/2014, but similar in distribution and
severity.

Upper abdomen: Normal imaged portions of the liver, spleen,
pancreas, adrenal glands. Cholecystectomy.

Musculoskeletal: No acute osseous abnormality.
IMPRESSION: 1. Overall similar in severity and distribution of upper lobe
peribronchovascular interstitial opacities, diffuse bronchiectasis,
and mucous filled bronchi. Given the concurrent hiatal hernia,
chronic aspiration is a consideration. Chronic atypical infection
could look similar.
2. A right lower lobe irregular opacity is favored to relate to this
infectious or inflammatory process. Given absence on prior exams,
recommend chest CT follow-up at 3 months after appropriate
antibiotic therapy and clinical exclusion of aspiration.
3.  Atherosclerosis, including within the coronary arteries.
4. Decrease in trace right pleural fluid.
5. Mild bilateral gynecomastia.

## 2016-01-07 MED ORDER — AMOXICILLIN-POT CLAVULANATE 875-125 MG PO TABS: 1.0000 | ORAL_TABLET | Freq: Two times a day (BID) | ORAL | Status: DC

## 2016-01-07 MED ORDER — IPRATROPIUM-ALBUTEROL 0.5-2.5 (3) MG/3ML IN SOLN
3.0000 mL | Freq: Once | RESPIRATORY_TRACT | Status: AC
  Administered 2016-01-07: 3 mL via RESPIRATORY_TRACT
  Filled 2016-01-07: qty 3

## 2016-01-07 MED ORDER — AMOXICILLIN-POT CLAVULANATE 500-125 MG PO TABS: 1.0000 | ORAL_TABLET | Freq: Two times a day (BID) | ORAL | Status: DC

## 2016-01-07 MED ORDER — PREDNISOLONE 5 MG PO TABS: 15.0000 mg | ORAL_TABLET | Freq: Every day | ORAL | Status: DC

## 2016-01-07 MED ORDER — SODIUM CHLORIDE 0.9 % IV BOLUS (SEPSIS)
500.0000 mL | Freq: Once | INTRAVENOUS | Status: AC
  Administered 2016-01-07: 500 mL via INTRAVENOUS

## 2016-01-07 MED ORDER — METHYLPREDNISOLONE SODIUM SUCC 125 MG IJ SOLR
125.0000 mg | Freq: Once | INTRAMUSCULAR | Status: AC
  Administered 2016-01-07: 125 mg via INTRAVENOUS
  Filled 2016-01-07: qty 2

## 2016-01-07 MED ORDER — IPRATROPIUM-ALBUTEROL 0.5-2.5 (3) MG/3ML IN SOLN: 3.0000 mL | RESPIRATORY_TRACT | Status: DC | PRN

## 2016-01-07 MED ORDER — SACCHAROMYCES BOULARDII 250 MG PO CAPS: 250.0000 mg | ORAL_CAPSULE | Freq: Two times a day (BID) | ORAL | Status: DC

## 2016-01-07 NOTE — Care Management Note (Signed)
Case Management Note  Patient Details  Name: Richard [name] MRN: [id number] Date of Birth: 1936/07/02  Subjective/Objective:   Patient presents to Ed with congested cough and shortness of breath                 Action/Plan: Patient to be discharged home with visiting RN and Rx for nebulizer machine. EDCM provided patient with list of homehealth agencies in [facility]  Patient has chosen [facility] for home health services and nebulizer machine as he has had dme services with them in the past for oxygen.  EDCM explained that [facility] will contact them within 24-48 hours.  [facility] provided patient with printed order for nebulizer machine to pick up at [facility] store if they choose.  Patient and patient's wife are familiar with [facility] store as they have taken equipment back to them at one time.  Home health orders  For RN and face to face placed.  [facility] faxed home health orders and order for nebulizer machine to [facility] with confirmation of receipt.  Patient and patient's wife thankful for services.  No further EDCM needs at this time.   Expected Discharge Date:   (unknown)               Expected Discharge Plan:  [place]  In-House Referral:     Discharge planning Services  CM Consult  Post Acute Care Choice:  Durable Medical Equipment, Home Health Choice offered to:  Patient, Spouse  DME Arranged:  Nebulizer machine DME Agency:  [place] Arranged:  RN [facility] Agency:  [place]  Status of Service:  Completed, signed off  Medicare Important Message Given:    Date Medicare IM Given:    Medicare IM give by:    Date Additional Medicare IM Given:    Additional Medicare Important Message give by:     If discussed at [place] of Stay Meetings, dates discussed:    Additional CommentsLivia Snellen, RN 01/07/2016, 6:32 PM

## 2016-01-07 NOTE — ED Notes (Signed)
Pt ambulated in hall, O2 stayed between 97-100%

## 2016-01-07 NOTE — Discharge Instructions (Signed)
Please take new medications as prescribed.  Call your pulmonologist and follow-up closely.  Return for worsening symptoms, including fever, chest pain, worsening breathing or any other symptoms concerning to you.   New medications changes:  Augmentin x 10 days, Florastor, increase Prednisone from 7.5 mg to15 mg daily, add Breathing treatments (Duonebs QID PRN) and order a Nebulizer machine  Acute Bronchitis Bronchitis is when the airways that extend from the windpipe into the lungs get red, puffy, and painful (inflamed). Bronchitis often causes thick spit (mucus) to develop. This leads to a cough. A cough is the most common symptom of bronchitis. In acute bronchitis, the condition usually begins suddenly and goes away over time (usually in 2 weeks). Smoking, allergies, and asthma can make bronchitis worse. Repeated episodes of bronchitis may cause more lung problems. HOME CARE  Rest.  Drink enough fluids to keep your pee (urine) clear or pale yellow (unless you need to limit fluids as told by your doctor).  Only take over-the-counter or prescription medicines as told by your doctor.  Avoid smoking and secondhand smoke. These can make bronchitis worse. If you are a smoker, think about using nicotine gum or skin patches. Quitting smoking will help your lungs heal faster.  Reduce the chance of getting bronchitis again by:  Washing your hands often.  Avoiding people with cold symptoms.  Trying not to touch your hands to your mouth, nose, or eyes.  Follow up with your doctor as told. GET HELP IF: Your symptoms do not improve after 1 week of treatment. Symptoms include:  Cough.  Fever.  Coughing up thick spit.  Body aches.  Chest congestion.  Chills.  Shortness of breath.  Sore throat. GET HELP RIGHT AWAY IF:   You have an increased fever.  You have chills.  You have severe shortness of breath.  You have bloody thick spit (sputum).  You throw up (vomit)  often.  You lose too much body fluid (dehydration).  You have a severe headache.  You faint. MAKE SURE YOU:   Understand these instructions.  Will watch your condition.  Will get help right away if you are not doing well or get worse.   This information is not intended to replace advice given to you by your health care provider. Make sure you discuss any questions you have with your health care provider.   Document Released: 01/11/2008 Document Revised: 03/27/2013 Document Reviewed: 01/15/2013 Elsevier Interactive Patient Education [employer].

## 2016-01-07 NOTE — Consult Note (Signed)
[facility] Medical Consultation  Richard [name] [id number] DOB: 11-05-1935 DOA: 01/07/2016 PCP: [name], MD   Requesting physician: Brantley Stage Date of consultation: 01/07/16 Reason for consultation: cough/ dyspnea  Impression/Recommendations Principal Problem:   Bronchiectasis with acute exacerbation  - not hypoxic, no fever or leukocytosis- does not need admission to the hospital - will prescribe Augmentin x 10 days, Florastor, increase Prednisone from 7.5 mg to15 mg daily, add Breathing treatments (Duonebs QID PRN) and order a Nebulizer machine - will have HHRN check on him and ask him to see Dr Sarajane Jews in 1 wk  Active Problems: Exertional dyspnea - may need pulmonary rehab    Hypothyroidism  -cont synthroid    Wegener's granulomatosis with renal involvement  - cont Cytoxan and increase Prednisone- f/u with Dr Sarajane Jews next week - has appt with Dr [name] for 15th    Chronic kidney disease, stage IV (severe) -stable    [facility] will followup again tomorrow. Please contact me if I can be of assistance in the meanwhile. Thank you for this consultation.  Chief Complaint: cough and dyspnea  HPI:  80 y/o with Wegners granulomatosis on Cytoxan, chronic bronchiectasis, CKD 4 presenting after finishing a course of Levaquin on 5/27 with continued cough with yellow sputum. Cough has improved but not gone away. He is short of breath on exertion. In the ER he has received IV solumedrol and a Duoneb and feels better. No hemoptysis, fevers, chills or chest pain.   Review of Systems  Constitutional: Negative for fever, chills weight loss HENT: Negative for ear pain, nosebleeds, congestion, facial swelling, rhinorrhea, neck pain, neck stiffness and ear discharge.   Respiratory: Negative for cough, shortness of breath, wheezing  Cardiovascular: Negative for chest pain, palpitations and leg swelling.  Gastrointestinal: Negative for heartburn, abdominal pain, vomiting, diarrhea  or consitpation Genitourinary: Negative for dysuria, urgency, frequency, hematuria Musculoskeletal: Negative for back pain or joint pain Neurological: Negative for dizziness, seizures, syncope, focal weakness,  numbness and headaches.  Hematological: Does not bruise/bleed easily.  Psychiatric/Behavioral: Negative for hallucinations, confusion, dysphoric mood   Past Medical History  Diagnosis Date  . GERD (gastroesophageal reflux disease)   . Asthma   . Fungal infection     lungs  . Cataract   . BPH (benign prostatic hyperplasia)     sees Dr. Risa Grill, biopsy June 2015 was benign   . Wegener's granulomatosis ([place])     sees Dr. [name]   . Peptic stricture of esophagus   . Anemia   . Hiatal hernia   . Adenomatous colon polyp   . Bronchiectasis ([place])   . On home oxygen therapy     uses 2 l/m nasally at bedtime  . HOH (hard of hearing)     bilaterally  . HTN (hypertension) 09/28/2013  . CAD (coronary artery disease)     a. [place] s/p [place] x3 and ultimately BMS to pLAD& POBA to CTO of circumflex/marginal vessel on 03/17/14 and staged BMS to [facility] on 03/21/14  . Femoral artery pseudo-aneurysm, right ([place])     a. s/p repair. Follow up with Dr. Kellie Simmering   . Diverticulosis    Past Surgical History  Procedure Laterality Date  . Hernia repair    . Eye surgery      cataracts removed.   . Colonoscopy  08-16-11    per Dr. Fuller Plan, diverticulosis and polyps, repeat in 5 yrs   . Esophagogastroduodenoscopy (egd) with esophageal dilation  11-29-10    per Dr. Fuller Plan   .  Cataract extraction, bilateral  12-18-12    bilateral  . Cholecystectomy N/A 12/21/2012    Procedure: LAPAROSCOPIC CHOLECYSTECTOMY WITH INTRAOPERATIVE CHOLANGIOGRAM;  Surgeon: Edward Jolly, MD;  Location: [facility] ORS;  Service: General;  Laterality: N/A;  . Hematoma evacuation Right 03/19/2014    Procedure: Suture repair of femoral artery with evacuation of hematoma;  Surgeon: Mal Misty, MD;  Location: [facility] OR;  Service: Vascular;   Laterality: Right;  . Coronary angioplasty  03/2014  . Left heart catheterization with coronary angiogram N/A 03/14/2014    Procedure: LEFT HEART CATHETERIZATION WITH CORONARY ANGIOGRAM;  Surgeon: Leonie Man, MD;  Location: [facility] CATH LAB;  Service: Cardiovascular;  Laterality: N/A;  . Percutaneous coronary stent intervention (pci-s) N/A 03/17/2014    Procedure: PERCUTANEOUS CORONARY STENT INTERVENTION (PCI-S);  Surgeon: Troy Sine, MD;  Location: [facility] CATH LAB;  Service: Cardiovascular;  Laterality: N/A;  . Cardiac catheterization  03/17/2014    Procedure: CORONARY BALLOON ANGIOPLASTY;  Surgeon: Troy Sine, MD;  Location: [facility] CATH LAB;  Service: Cardiovascular;;  . Percutaneous coronary stent intervention (pci-s) N/A 03/21/2014    Procedure: PERCUTANEOUS CORONARY STENT INTERVENTION (PCI-S);  Surgeon: Burnell Blanks, MD;  Location: [facility] CATH LAB;  Service: Cardiovascular;  Laterality: N/A;  . Renal biopsy     Social History:  reports that he has never smoked. He has never used smokeless tobacco. He reports that he does not drink alcohol or use illicit drugs.  No Known Allergies Family History  Problem Relation Age of Onset  . Asthma Father   . Coronary artery disease Brother   . Coronary artery disease Brother   . Coronary artery disease Mother   . Colon cancer Neg Hx   . Stomach cancer Neg Hx     Prior to Admission medications   Medication Sig Start Date End Date Taking? Authorizing Provider  albuterol (PROAIR HFA) 108 (90 BASE) MCG/ACT inhaler Inhale 2 puffs into the lungs every 6 (six) hours as needed for wheezing or shortness of breath. 04/09/15  Yes Tanda Rockers, MD  aspirin EC 81 MG tablet Take 81 mg by mouth at bedtime.    Yes Historical Provider, MD  b complex vitamins tablet Take 1 tablet by mouth daily.     Yes Historical Provider, MD  bisacodyl (DULCOLAX) 5 MG EC tablet Take 5 mg by mouth 2 (two) times daily.    Yes Historical Provider, MD  calcitRIOL (ROCALTROL) 0.25  MCG capsule Take 1 capsule by mouth at bedtime and may repeat dose one time if needed.  09/06/15  Yes Historical Provider, MD  calcium citrate-vitamin D (CITRACAL+D) 315-200 MG-UNIT per tablet Take 1 tablet by mouth daily.   Yes Historical Provider, MD  clopidogrel (PLAVIX) 75 MG tablet TAKE ONE TABLET BY MOUTH ONCE DAILY WITH BREAKFAST 10/01/15  Yes Pixie Casino, MD  Cyclophosphamide 50 MG CAPS Take 1 capsule (50 mg total) by mouth daily. Take on an empty stomach 1 hour before or 2 hours after meals.  Currently stopped in the hospital. Please follow-up with your PCP or Pulmonologist regarding decision to resume. 04/21/15  Yes Modena Jansky, MD  finasteride (PROSCAR) 5 MG tablet TAKE ONE TABLET BY MOUTH ONCE DAILY 11/30/15  Yes [name], MD  guaiFENesin (MUCINEX) 600 MG 12 hr tablet Take 600 mg by mouth 2 (two) times daily.   Yes Historical Provider, MD  loratadine (CLARITIN) 10 MG tablet Take 10 mg by mouth daily.    Yes Historical Provider,  MD  methylcellulose (CITRUCEL) oral powder Take 1 packet by mouth daily.   Yes Historical Provider, MD  metoprolol succinate (TOPROL-XL) 25 MG 24 hr tablet Take 1 tablet (25 mg total) by mouth daily. 04/27/15  Yes Pixie Casino, MD  prednisoLONE 5 MG TABS tablet Take 7.5 mg by mouth daily.   Yes Historical Provider, MD  simvastatin (ZOCOR) 20 MG tablet TAKE ONE TABLET BY MOUTH ONCE DAILY AT  6  PM 10/05/15  Yes Pixie Casino, MD  sodium bicarbonate 650 MG tablet Take 2 tablets (1,300 mg total) by mouth 2 (two) times daily. NEED OV. 08/12/15  Yes Pixie Casino, MD  SYMBICORT 160-4.5 MCG/ACT inhaler INHALE TWO PUFFS BY MOUTH TWICE DAILY 06/01/15  Yes Tanda Rockers, MD  tamsulosin (FLOMAX) 0.4 MG CAPS capsule TAKE ONE CAPSULE BY MOUTH ONCE DAILY IN THE EVENING 12/07/15  Yes [name], MD  clarithromycin (BIAXIN) 500 MG tablet TAKE ONE TABLET BY MOUTH TWICE DAILY Patient not taking: Reported on 01/07/2016 11/30/15   [name], MD  dextromethorphan  (DELSYM) 30 MG/5ML liquid Take 30 mg by mouth 2 (two) times daily as needed for cough.    Historical Provider, MD  HYDROcodone-homatropine (HYDROMET) 5-1.5 MG/5ML syrup Take 5 mLs by mouth every 4 (four) hours as needed. Patient taking differently: Take 5 mLs by mouth every 4 (four) hours as needed for cough.  11/24/14   [name], MD  nitroGLYCERIN (NITROSTAT) 0.4 MG SL tablet Place 1 tablet (0.4 mg total) under the tongue every 5 (five) minutes x 3 doses as needed for chest pain. 03/24/15   Pixie Casino, MD  ranitidine (ZANTAC) 300 MG tablet Take 1 tablet (300 mg total) by mouth 2 (two) times daily. Patient not taking: Reported on 01/07/2016 04/07/15   Ladene Artist, MD  traMADol (ULTRAM) 50 MG tablet Take 1 tablet (50 mg total) by mouth daily as needed for moderate pain. Patient not taking: Reported on 11/05/2015 04/21/15   Modena Jansky, MD    Physical Exam: Blood pressure 131/73, pulse 63, temperature 98.7 F (37.1 C), temperature source Oral, resp. rate 18, height 5\' 6"  (1.676 m), weight 53.524 kg (118 lb), SpO2 97 %. @VITALS2 @ [employer]   01/07/16 1542  Weight: 53.524 kg (118 lb)   No intake or output data in the 24 hours ending 01/07/16 1823   Constitutional: Appears well-developed and well-nourished. No distress. HENT: Normocephalic. External right and left ear normal. Oropharynx is clear and moist.  Eyes: Conjunctivae and EOM are normal. PERRLA, no scleral icterus.  Neck: Normal ROM. Neck supple. No JVD. No tracheal deviation. No thyromegaly.  CVS: RRR, S1/S2 +, no murmurs, no gallops, no carotid bruit.  Pulmonary: Effort and breath sounds normal, no stridor, rhonchi, wheezes, rales.  Abdominal: Soft. BS +,  no distension, tenderness, rebound or guarding.  Musculoskeletal: Normal range of motion. No edema and no tenderness.  Neuro: Alert. Normal reflexes, muscle tone coordination. No cranial nerve deficit. Skin: Skin is warm and dry. No rash noted. Not diaphoretic. No  erythema. No pallor.  Psychiatric: Normal mood and affect. Behavior, judgment, thought content normal.    Labs on Admission:  Basic Metabolic Panel:  Recent Labs Lab 01/07/16 1507  NA 139  K 4.3  CL 108  CO2 25  GLUCOSE 142*  BUN 37*  CREATININE 2.03*  CALCIUM 8.7*   Liver Function Tests:  Recent Labs Lab 01/07/16 1507  AST 16  ALT 9*  ALKPHOS  76  BILITOT 0.8  PROT 5.7*  ALBUMIN 3.5   No results for input(s): LIPASE, AMYLASE in the last 168 hours. No results for input(s): AMMONIA in the last 168 hours. CBC:  Recent Labs Lab 01/06/16 1344 01/07/16 1507  WBC  --  6.2  NEUTROABS  --  5.3  HGB 10.6* 10.2*  HCT  --  31.1*  MCV  --  111.5*  PLT  --  162   Cardiac Enzymes: No results for input(s): CKTOTAL, CKMB, CKMBINDEX, TROPONINI in the last 168 hours. BNP: Invalid input(s): POCBNP CBG: No results for input(s): GLUCAP in the last 168 hours.  Radiological Exams on Admission: Dg Chest 2 View  01/07/2016  CLINICAL DATA:  Shortness of breath, cough. EXAM: CHEST  2 VIEW COMPARISON:  October 13, 2015. FINDINGS: The heart size and mediastinal contours are within normal limits. Stable moderate size hiatal hernia is noted. No pneumothorax or pleural effusion is noted. Stable coarse fibrotic densities are noted throughout both lungs. The visualized skeletal structures are unremarkable. IMPRESSION: No significant change compared to prior exam. Stable moderate size hiatal hernia. Stable coarse fibrotic densities noted throughout both lungs consistent with pulmonary fibrosis or chronic scarring. Electronically Signed   By: Marijo Conception, M.D.   On: 01/07/2016 15:06   Ct Chest Wo Contrast  01/07/2016  CLINICAL DATA:  Shortness of breath, productive cough. EXAM: CT CHEST WITHOUT CONTRAST TECHNIQUE: Multidetector CT imaging of the chest was performed following the standard protocol without IV contrast. COMPARISON:  CT scan of May 28, 2015. FINDINGS: No pneumothorax or  significant pleural effusion is noted. Bronchiectasis with peribronchial thickening is noted in both lungs, but most prominently seen in the lower lobes and lingular segment of left upper lobe and right middle lobe. Stable biapical scarring is noted. There remains debris within the the lower lobe bronchi suggesting mucous plugging. Large hiatal hernia is again noted. Irregular densities are noted posteriorly in the right lower lobe most consistent with scarring or subsegmental atelectasis. Focal irregular density measuring 14 x 9 mm is noted in this area which was not present on prior exam and therefore is most consistent with focal inflammation or atelectasis. Atherosclerosis of thoracic aorta is noted without aneurysm formation. Coronary artery calcifications are noted suggesting coronary artery disease. Visualized portion of upper abdomen is unremarkable. No mediastinal mass or adenopathy is noted. IMPRESSION: Stable large hiatal hernia. Coronary artery calcifications are noted suggesting coronary artery disease. Grossly stable bronchiectasis with peribronchial thickening is noted throughout both lungs, which is most prominently seen in the lower lobes bilaterally. There remains debris within the lower lobe bronchi suggesting mucous plugging. These findings most consistent with chronic inflammatory process or chronic atypical infection. Irregular densities are noted posteriorly in the right lower lobe most consistent with scarring or subsegmental atelectasis or possibly inflammation. Electronically Signed   By: Marijo Conception, M.D.   On: 01/07/2016 16:33    EKG: Independently reviewed. NSR at 77  Time spent: 60 min  Porter, MD [facility] To page rounding or on call physician  [email or web address] 01/07/2016, 6:23 PM

## 2016-01-07 NOTE — ED Notes (Signed)
Recently finished Levequin for bronchitis. One week prior began having weakness and continued congested cough with SOB. Coarse faint rhonchi auscultated in lower lobes bilaterally. Oral temp WNL, but patient feels warm to touch.

## 2016-01-07 NOTE — ED Provider Notes (Signed)
CSN: [id number]     Arrival date & time 01/07/16  1423 History   First MD Initiated Contact with Patient 01/07/16 1450     Chief Complaint  Patient presents with  . Shortness of Breath  . Cough     (Consider location/radiation/quality/duration/timing/severity/associated sxs/prior Treatment) HPI  80 year old male who presents with shortness of breath and cough. He has a history of Wegener's granulomatosis on chronic immunosuppression, chronic bronchiectasis, multivessel coronary artery disease with multiple PCI/stents, hypertension. States that he usually has flareup of his bronchiectasis every 2-3 weeks requiring a course of antibiotics. States that about a week or 2 ago had increased cough and shortness of breath and finished a course of Levaquin on May 27. Initially symptoms seemed to improve, but over the course of the past 2-3 days he has had severe shortness of breath, fatigue, and productive cough of clear to yellow sputum. States that rest he seems comfortable, but with activity he is barely able to breathe. Only wears home oxygen at that time of 2 L. He has not had fever, chills, chest pain, lower extremity edema, orthopnea, PND. Denies nausea, vomiting, or urinary symptoms.  Past Medical History  Diagnosis Date  . GERD (gastroesophageal reflux disease)   . Asthma   . Fungal infection     lungs  . Cataract   . BPH (benign prostatic hyperplasia)     sees Dr. Risa Grill, biopsy June 2015 was benign   . Wegener's granulomatosis ([place])     sees Dr. [name]   . Peptic stricture of esophagus   . Anemia   . Hiatal hernia   . Adenomatous colon polyp   . Bronchiectasis ([place])   . On home oxygen therapy     uses 2 l/m nasally at bedtime  . HOH (hard of hearing)     bilaterally  . HTN (hypertension) 09/28/2013  . CAD (coronary artery disease)     a. [place] s/p [place] x3 and ultimately BMS to pLAD& POBA to CTO of circumflex/marginal vessel on 03/17/14 and staged BMS to [facility] on 03/21/14  . Femoral  artery pseudo-aneurysm, right ([place])     a. s/p repair. Follow up with Dr. Kellie Simmering   . Diverticulosis    Past Surgical History  Procedure Laterality Date  . Hernia repair    . Eye surgery      cataracts removed.   . Colonoscopy  08-16-11    per Dr. Fuller Plan, diverticulosis and polyps, repeat in 5 yrs   . Esophagogastroduodenoscopy (egd) with esophageal dilation  11-29-10    per Dr. Fuller Plan   . Cataract extraction, bilateral  12-18-12    bilateral  . Cholecystectomy N/A 12/21/2012    Procedure: LAPAROSCOPIC CHOLECYSTECTOMY WITH INTRAOPERATIVE CHOLANGIOGRAM;  Surgeon: Edward Jolly, MD;  Location: [facility] ORS;  Service: General;  Laterality: N/A;  . Hematoma evacuation Right 03/19/2014    Procedure: Suture repair of femoral artery with evacuation of hematoma;  Surgeon: Mal Misty, MD;  Location: [facility] OR;  Service: Vascular;  Laterality: Right;  . Coronary angioplasty  03/2014  . Left heart catheterization with coronary angiogram N/A 03/14/2014    Procedure: LEFT HEART CATHETERIZATION WITH CORONARY ANGIOGRAM;  Surgeon: Leonie Man, MD;  Location: [facility] CATH LAB;  Service: Cardiovascular;  Laterality: N/A;  . Percutaneous coronary stent intervention (pci-s) N/A 03/17/2014    Procedure: PERCUTANEOUS CORONARY STENT INTERVENTION (PCI-S);  Surgeon: Troy Sine, MD;  Location: [facility] CATH LAB;  Service: Cardiovascular;  Laterality: N/A;  . Cardiac catheterization  03/17/2014    Procedure: CORONARY BALLOON ANGIOPLASTY;  Surgeon: Troy Sine, MD;  Location: [facility] CATH LAB;  Service: Cardiovascular;;  . Percutaneous coronary stent intervention (pci-s) N/A 03/21/2014    Procedure: PERCUTANEOUS CORONARY STENT INTERVENTION (PCI-S);  Surgeon: Burnell Blanks, MD;  Location: [facility] CATH LAB;  Service: Cardiovascular;  Laterality: N/A;  . Renal biopsy     Family History  Problem Relation Age of Onset  . Asthma Father   . Coronary artery disease Brother   . Coronary artery disease Brother   . Coronary artery  disease Mother   . Colon cancer Neg Hx   . Stomach cancer Neg Hx    Social History  Substance Use Topics  . Smoking status: Never Smoker   . Smokeless tobacco: Never Used  . Alcohol Use: No    Review of Systems 10/14 systems reviewed and are negative other than those stated in the HPI    Allergies  Review of patient's allergies indicates no known allergies.  Home Medications   Prior to Admission medications   Medication Sig Start Date End Date Taking? Authorizing Provider  albuterol (PROAIR HFA) 108 (90 BASE) MCG/ACT inhaler Inhale 2 puffs into the lungs every 6 (six) hours as needed for wheezing or shortness of breath. 04/09/15  Yes Tanda Rockers, MD  aspirin EC 81 MG tablet Take 81 mg by mouth at bedtime.    Yes Historical Provider, MD  b complex vitamins tablet Take 1 tablet by mouth daily.     Yes Historical Provider, MD  bisacodyl (DULCOLAX) 5 MG EC tablet Take 5 mg by mouth 2 (two) times daily.    Yes Historical Provider, MD  calcitRIOL (ROCALTROL) 0.25 MCG capsule Take 1 capsule by mouth at bedtime and may repeat dose one time if needed.  09/06/15  Yes Historical Provider, MD  calcium citrate-vitamin D (CITRACAL+D) 315-200 MG-UNIT per tablet Take 1 tablet by mouth daily.   Yes Historical Provider, MD  clopidogrel (PLAVIX) 75 MG tablet TAKE ONE TABLET BY MOUTH ONCE DAILY WITH BREAKFAST 10/01/15  Yes Pixie Casino, MD  Cyclophosphamide 50 MG CAPS Take 1 capsule (50 mg total) by mouth daily. Take on an empty stomach 1 hour before or 2 hours after meals.  Currently stopped in the hospital. Please follow-up with your PCP or Pulmonologist regarding decision to resume. 04/21/15  Yes Modena Jansky, MD  finasteride (PROSCAR) 5 MG tablet TAKE ONE TABLET BY MOUTH ONCE DAILY 11/30/15  Yes [name], MD  guaiFENesin (MUCINEX) 600 MG 12 hr tablet Take 600 mg by mouth 2 (two) times daily.   Yes Historical Provider, MD  loratadine (CLARITIN) 10 MG tablet Take 10 mg by mouth daily.     Yes Historical Provider, MD  methylcellulose (CITRUCEL) oral powder Take 1 packet by mouth daily.   Yes Historical Provider, MD  metoprolol succinate (TOPROL-XL) 25 MG 24 hr tablet Take 1 tablet (25 mg total) by mouth daily. 04/27/15  Yes Pixie Casino, MD  simvastatin (ZOCOR) 20 MG tablet TAKE ONE TABLET BY MOUTH ONCE DAILY AT  6  PM 10/05/15  Yes Pixie Casino, MD  sodium bicarbonate 650 MG tablet Take 2 tablets (1,300 mg total) by mouth 2 (two) times daily. NEED OV. 08/12/15  Yes Pixie Casino, MD  SYMBICORT 160-4.5 MCG/ACT inhaler INHALE TWO PUFFS BY MOUTH TWICE DAILY 06/01/15  Yes Tanda Rockers, MD  tamsulosin (FLOMAX) 0.4 MG CAPS capsule TAKE ONE CAPSULE BY MOUTH ONCE DAILY  IN THE EVENING 12/07/15  Yes [name], MD  amoxicillin-clavulanate (AUGMENTIN) 500-125 MG tablet Take 1 tablet (500 mg total) by mouth 2 (two) times daily. 01/07/16   Debbe Odea, MD  dextromethorphan (DELSYM) 30 MG/5ML liquid Take 30 mg by mouth 2 (two) times daily as needed for cough.    Historical Provider, MD  HYDROcodone-homatropine (HYDROMET) 5-1.5 MG/5ML syrup Take 5 mLs by mouth every 4 (four) hours as needed. Patient taking differently: Take 5 mLs by mouth every 4 (four) hours as needed for cough.  11/24/14   [name], MD  ipratropium-albuterol (DUONEB) 0.5-2.5 (3) MG/3ML SOLN Take 3 mLs by nebulization every 4 (four) hours as needed (if inhaler is not effective in resolving shortness of breath or wheezing). 01/07/16   Debbe Odea, MD  nitroGLYCERIN (NITROSTAT) 0.4 MG SL tablet Place 1 tablet (0.4 mg total) under the tongue every 5 (five) minutes x 3 doses as needed for chest pain. 03/24/15   Pixie Casino, MD  prednisoLONE 5 MG TABS tablet Take 3 tablets (15 mg total) by mouth daily. 01/07/16   Debbe Odea, MD  saccharomyces boulardii (FLORASTOR) 250 MG capsule Take 1 capsule (250 mg total) by mouth 2 (two) times daily. 01/07/16   Debbe Odea, MD   BP 138/74 mmHg  Pulse 91  Temp(Src) 98.7 F (37.1 C)  (Oral)  Resp 18  Ht 5\' 6"  (1.676 m)  Wt 118 lb (53.524 kg)  BMI 19.05 kg/m2  SpO2 97% Physical Exam Physical Exam  Nursing note and vitals reviewed. Constitutional: elderly man, chronically ill appearing, non-toxic, and in no acute distress Head: Normocephalic and atraumatic.  Mouth/Throat: Oropharynx is clear and moist.  Neck: Normal range of motion. Neck supple.  Cardiovascular: Normal rate and regular rhythm.  NO LE edema. Pulmonary/Chest: Effort normal. Faint expiratory wheezing throughout. No conversational dyspnea Abdominal: Soft. There is no tenderness. There is no rebound and no guarding.  Musculoskeletal: Normal range of motion.  Neurological: Alert, no facial droop, fluent speech, moves all extremities symmetrically Skin: Skin is warm and dry.  Psychiatric: Cooperative  ED Course  Procedures (including critical care time) Labs Review Labs Reviewed  COMPREHENSIVE METABOLIC PANEL - Abnormal; Notable for the following:    Glucose, Bld 142 (*)    BUN 37 (*)    Creatinine, Ser 2.03 (*)    Calcium 8.7 (*)    Total Protein 5.7 (*)    ALT 9 (*)    GFR calc non Af Amer 30 (*)    GFR calc Af Amer 35 (*)    All other components within normal limits  CBC WITH DIFFERENTIAL/PLATELET - Abnormal; Notable for the following:    RBC 2.79 (*)    Hemoglobin 10.2 (*)    HCT 31.1 (*)    MCV 111.5 (*)    [name] 36.6 (*)    Lymphs Abs 0.5 (*)    All other components within normal limits  BRAIN NATRIURETIC PEPTIDE - Abnormal; Notable for the following:    B Natriuretic Peptide 151.7 (*)    All other components within normal limits  URINE CULTURE  CULTURE, BLOOD (ROUTINE X 2)  CULTURE, BLOOD (ROUTINE X 2)  URINALYSIS, ROUTINE W REFLEX MICROSCOPIC (NOT AT [facility])  I-STAT CG4 LACTIC ACID, ED  Randolm Idol, ED    Imaging Review Dg Chest 2 View  01/07/2016  CLINICAL DATA:  Shortness of breath, cough. EXAM: CHEST  2 VIEW COMPARISON:  October 13, 2015. FINDINGS: The heart size and  mediastinal  contours are within normal limits. Stable moderate size hiatal hernia is noted. No pneumothorax or pleural effusion is noted. Stable coarse fibrotic densities are noted throughout both lungs. The visualized skeletal structures are unremarkable. IMPRESSION: No significant change compared to prior exam. Stable moderate size hiatal hernia. Stable coarse fibrotic densities noted throughout both lungs consistent with pulmonary fibrosis or chronic scarring. Electronically Signed   By: Marijo Conception, M.D.   On: 01/07/2016 15:06   Ct Chest Wo Contrast  01/07/2016  CLINICAL DATA:  Shortness of breath, productive cough. EXAM: CT CHEST WITHOUT CONTRAST TECHNIQUE: Multidetector CT imaging of the chest was performed following the standard protocol without IV contrast. COMPARISON:  CT scan of May 28, 2015. FINDINGS: No pneumothorax or significant pleural effusion is noted. Bronchiectasis with peribronchial thickening is noted in both lungs, but most prominently seen in the lower lobes and lingular segment of left upper lobe and right middle lobe. Stable biapical scarring is noted. There remains debris within the the lower lobe bronchi suggesting mucous plugging. Large hiatal hernia is again noted. Irregular densities are noted posteriorly in the right lower lobe most consistent with scarring or subsegmental atelectasis. Focal irregular density measuring 14 x 9 mm is noted in this area which was not present on prior exam and therefore is most consistent with focal inflammation or atelectasis. Atherosclerosis of thoracic aorta is noted without aneurysm formation. Coronary artery calcifications are noted suggesting coronary artery disease. Visualized portion of upper abdomen is unremarkable. No mediastinal mass or adenopathy is noted. IMPRESSION: Stable large hiatal hernia. Coronary artery calcifications are noted suggesting coronary artery disease. Grossly stable bronchiectasis with peribronchial thickening is  noted throughout both lungs, which is most prominently seen in the lower lobes bilaterally. There remains debris within the lower lobe bronchi suggesting mucous plugging. These findings most consistent with chronic inflammatory process or chronic atypical infection. Irregular densities are noted posteriorly in the right lower lobe most consistent with scarring or subsegmental atelectasis or possibly inflammation. Electronically Signed   By: Marijo Conception, M.D.   On: 01/07/2016 16:33   I have personally reviewed and evaluated these images and lab results as part of my medical decision-making.   EKG Interpretation   Date/Time:  Thursday January 07 2016 15:40:12 EDT Ventricular Rate:  77 PR Interval:  222 QRS Duration: 95 QT Interval:  386 QTC Calculation: 437 R Axis:   71 Text Interpretation:  Sinus rhythm Prolonged PR interval Probable left  atrial enlargement Anterior infarct, old Unchanged EKG compared to prior   Confirmed by Jazz Biddy MD, Nattaly Yebra [phone]) on 01/07/2016 3:56:58 PM      MDM   Final diagnoses:  Bronchiectasis with acute exacerbation (HCC)    History of chronic bronchiectasis and Wegener's grandulomatosis who presents with persistent cough and sob after recent treatment for PNA with levaquin. Well appearing on presentation, and is no acute distress. Breathing comfortably on room air with normal oxygenation and no cough or station with dyspnea. Is afebrile hemodynamically stable. Lungs with scattered wheezes and some coarse breath sounds. Appears euvolemic is not mildly dry. Chest x-ray with no new infiltrate, edema or other acute cardiopulmonary processes. Given his age, immunosuppression, chronic lung disease CT chest was performed. Evidence of some mucous plugging but no pneumonia or other acute processes. Given breathing treatments, Solu-Medrol, and small bolus of IV fluids, and feels improved. Is able to ambulate without hypoxia. EKG non-ischemic and troponin negative, presentation  does not seem c/w with that of ACS. Did initially consult  hospitalist regarding management, and recommending discharge with augmentin, steroids, and breathing treatments.  Patient agreeable. Strict return and follow-up instructions reviewed. He expressed understanding of all discharge instructions and felt comfortable with the plan of care.    Forde Dandy, MD 01/08/16 [phone]

## 2016-01-09 LAB — URINE CULTURE: Culture: NO GROWTH

## 2016-01-12 ENCOUNTER — Other Ambulatory Visit (HOSPITAL_COMMUNITY): Payer: Self-pay | Admitting: *Deleted

## 2016-01-12 LAB — CULTURE, BLOOD (ROUTINE X 2)
CULTURE: NO GROWTH
CULTURE: NO GROWTH

## 2016-01-13 ENCOUNTER — Encounter (HOSPITAL_COMMUNITY)
Admission: RE | Admit: 2016-01-13 | Discharge: 2016-01-13 | Disposition: A | Discharge status: Home / Self Care | Payer: Medicare Other | Source: Ambulatory Visit | Attending: Nephrology | Admitting: Nephrology

## 2016-01-13 DIAGNOSIS — D631 Anemia in chronic kidney disease: Secondary | ICD-10-CM | POA: Diagnosis not present

## 2016-01-13 DIAGNOSIS — N183 Chronic kidney disease, stage 3 (moderate): Secondary | ICD-10-CM | POA: Diagnosis present

## 2016-01-13 DIAGNOSIS — Z5181 Encounter for therapeutic drug level monitoring: Secondary | ICD-10-CM | POA: Insufficient documentation

## 2016-01-13 DIAGNOSIS — Z79899 Other long term (current) drug therapy: Secondary | ICD-10-CM | POA: Insufficient documentation

## 2016-01-13 LAB — POCT HEMOGLOBIN-HEMACUE: Hemoglobin: 9.3 g/dL — ABNORMAL LOW (ref 13.0–17.0)

## 2016-01-13 MED ORDER — EPOETIN ALFA 10000 UNIT/ML IJ SOLN
INTRAMUSCULAR | Status: AC
  Administered 2016-01-13: 10000 [IU]
  Filled 2016-01-13: qty 1

## 2016-01-13 MED ORDER — EPOETIN ALFA 20000 UNIT/ML IJ SOLN
INTRAMUSCULAR | Status: AC
  Administered 2016-01-13: 20000 [IU]
  Filled 2016-01-13: qty 1

## 2016-01-13 MED ORDER — EPOETIN ALFA 40000 UNIT/ML IJ SOLN: 30000.0000 [IU] | INTRAMUSCULAR | Status: DC

## 2016-01-19 ENCOUNTER — Encounter (HOSPITAL_COMMUNITY)
Admission: RE | Admit: 2016-01-19 | Discharge: 2016-01-19 | Disposition: A | Discharge status: Home / Self Care | Payer: Medicare Other | Source: Ambulatory Visit | Attending: Nephrology | Admitting: Nephrology

## 2016-01-19 DIAGNOSIS — N183 Chronic kidney disease, stage 3 (moderate): Secondary | ICD-10-CM | POA: Diagnosis not present

## 2016-01-19 LAB — IRON AND TIBC
Iron: 47 ug/dL (ref 45–182)
SATURATION RATIOS: 20 % (ref 17.9–39.5)
TIBC: 234 ug/dL — AB (ref 250–450)
UIBC: 187 ug/dL

## 2016-01-19 LAB — FERRITIN: Ferritin: 171 ng/mL (ref 24–336)

## 2016-01-19 MED ORDER — EPOETIN ALFA 20000 UNIT/ML IJ SOLN
INTRAMUSCULAR | Status: AC
  Filled 2016-01-19: qty 1

## 2016-01-19 MED ORDER — EPOETIN ALFA 10000 UNIT/ML IJ SOLN
INTRAMUSCULAR | Status: AC
  Filled 2016-01-19: qty 1

## 2016-01-19 MED ORDER — EPOETIN ALFA 40000 UNIT/ML IJ SOLN: 30000.0000 [IU] | INTRAMUSCULAR | Status: DC

## 2016-01-20 ENCOUNTER — Telehealth: Payer: Self-pay | Admitting: General Practice

## 2016-01-21 ENCOUNTER — Ambulatory Visit: Payer: Medicare Other | Admitting: Internal Medicine

## 2016-01-28 ENCOUNTER — Other Ambulatory Visit (HOSPITAL_COMMUNITY): Payer: Self-pay | Admitting: *Deleted

## 2016-01-29 ENCOUNTER — Encounter (HOSPITAL_COMMUNITY)
Admission: RE | Admit: 2016-01-29 | Discharge: 2016-01-29 | Disposition: A | Discharge status: Home / Self Care | Payer: Medicare Other | Source: Ambulatory Visit | Attending: Nephrology | Admitting: Nephrology

## 2016-01-29 DIAGNOSIS — N183 Chronic kidney disease, stage 3 (moderate): Secondary | ICD-10-CM | POA: Diagnosis not present

## 2016-01-29 DIAGNOSIS — D509 Iron deficiency anemia, unspecified: Secondary | ICD-10-CM | POA: Diagnosis not present

## 2016-01-29 DIAGNOSIS — D631 Anemia in chronic kidney disease: Secondary | ICD-10-CM | POA: Insufficient documentation

## 2016-01-29 DIAGNOSIS — Z79899 Other long term (current) drug therapy: Secondary | ICD-10-CM | POA: Insufficient documentation

## 2016-01-29 DIAGNOSIS — Z5181 Encounter for therapeutic drug level monitoring: Secondary | ICD-10-CM | POA: Insufficient documentation

## 2016-01-29 LAB — POCT HEMOGLOBIN-HEMACUE: Hemoglobin: 9.6 g/dL — ABNORMAL LOW (ref 13.0–17.0)

## 2016-01-29 MED ORDER — FERUMOXYTOL INJECTION 510 MG/17 ML
510.0000 mg | INTRAVENOUS | Status: DC
  Administered 2016-01-29: 510 mg via INTRAVENOUS
  Filled 2016-01-29: qty 17

## 2016-01-29 MED ORDER — EPOETIN ALFA 10000 UNIT/ML IJ SOLN
INTRAMUSCULAR | Status: AC
Start: 2016-01-29 — End: 2016-01-29
  Administered 2016-01-29: 10000 [IU] via SUBCUTANEOUS
  Filled 2016-01-29: qty 1

## 2016-01-29 MED ORDER — EPOETIN ALFA 20000 UNIT/ML IJ SOLN
INTRAMUSCULAR | Status: AC
Start: 2016-01-29 — End: 2016-01-29
  Administered 2016-01-29: 20000 [IU] via SUBCUTANEOUS
  Filled 2016-01-29: qty 1

## 2016-02-01 ENCOUNTER — Ambulatory Visit (INDEPENDENT_AMBULATORY_CARE_PROVIDER_SITE_OTHER): Payer: Medicare Other | Admitting: Internal Medicine

## 2016-02-01 ENCOUNTER — Encounter: Payer: Self-pay | Admitting: Internal Medicine

## 2016-02-01 VITALS — BP 104/62 | HR 76 | Ht 67.0 in | Wt 117.0 lb

## 2016-02-01 DIAGNOSIS — J471 Bronchiectasis with (acute) exacerbation: Secondary | ICD-10-CM | POA: Diagnosis not present

## 2016-02-01 DIAGNOSIS — J479 Bronchiectasis, uncomplicated: Secondary | ICD-10-CM

## 2016-02-01 LAB — PULMONARY FUNCTION TEST
DL/VA % PRED: 85 %
DL/VA: 3.75 ml/min/mmHg/L
DLCO COR: 13.08 ml/min/mmHg
DLCO UNC % PRED: 44 %
DLCO cor % pred: 46 %
DLCO unc: 12.49 ml/min/mmHg
FEF 25-75 POST: 0.57 L/s
FEF 25-75 PRE: 0.45 L/s
FEF2575-%CHANGE-POST: 28 %
FEF2575-%PRED-PRE: 24 %
FEF2575-%Pred-Post: 31 %
FEV1-%Change-Post: 7 %
FEV1-%PRED-PRE: 48 %
FEV1-%Pred-Post: 51 %
FEV1-POST: 1.34 L
FEV1-Pre: 1.25 L
FEV1FVC-%CHANGE-POST: 4 %
FEV1FVC-%PRED-PRE: 70 %
FEV6-%CHANGE-POST: 3 %
FEV6-%PRED-PRE: 68 %
FEV6-%Pred-Post: 70 %
FEV6-Post: 2.39 L
FEV6-Pre: 2.3 L
FEV6FVC-%Change-Post: 1 %
FEV6FVC-%Pred-Post: 102 %
FEV6FVC-%Pred-Pre: 100 %
FVC-%Change-Post: 2 %
FVC-%Pred-Post: 68 %
FVC-%Pred-Pre: 67 %
FVC-Post: 2.51 L
FVC-Pre: 2.46 L
POST FEV1/FVC RATIO: 53 %
POST FEV6/FVC RATIO: 95 %
PRE FEV1/FVC RATIO: 51 %
Pre FEV6/FVC Ratio: 94 %
RV % pred: 118 %
RV: 2.89 L
TLC % pred: 86 %
TLC: 5.56 L

## 2016-02-01 MED ORDER — IPRATROPIUM-ALBUTEROL 0.5-2.5 (3) MG/3ML IN SOLN: 3.0000 mL | RESPIRATORY_TRACT | Status: DC | PRN

## 2016-02-01 NOTE — Patient Instructions (Addendum)
Plan A = Automatic = Symbicort 160 Take 2 puffs first thing in am and then another 2 puffs about 12 hours later.   Plan B = Backup Only use your albuterol (Red =Proair/ yellow = Proventil) as a rescue medication to be used if you can't catch your breath by resting or doing a relaxed purse lip breathing pattern.  - The less you use it, the better it will work when you need it. - Ok to use the inhaler up to 2 puffs  every 4 hours if you must but call for appointment if use goes up over your usual need - Don't leave home without it !!  (think of it like the spare tire for your car)   Plan C = Crisis - only use your albuterol/ipatropium(duoneb) nebulizer if you first try Plan B and it fails to help > ok to use the nebulizer up to every 4 hours but if start needing it regularly call for immediate appointment   Plan D = Doctor - call me if B and C not adequate  Plan E = ER - go to ER or call 911 if all else fails    See Tammy NP 4 weeks with all your medications, even over the counter meds, separated in two separate bags, the ones you take no matter what vs the ones you stop once you feel better and take only as needed when you feel you need them.   Tammy  will generate for you a new user friendly medication calendar that will put [name] all on the same page re: your medication use.     Without this process, it simply isn't possible to assure that we are providing  your outpatient care  with  the attention to detail we feel you deserve.   If we cannot assure that you're getting that kind of care,  then we cannot manage your problem effectively from this clinic.  Once you have seen Tammy and we are sure that we're all on the same page with your medication use she will arrange follow up with me.

## 2016-02-01 NOTE — Progress Notes (Signed)
Subjective:    Patient ID: Richard [name], male    DOB: 1935-11-22     MRN: [id number]   Brief patient profile:  64  yowm never smoker with dx of Longstanding bronchiectasis then dx with WG 03/2010 > requested establish with Jiah Bari.   Admit [place] dx WG by pos c anca 03/2010, supportive tbbx but not vats   Admit [name] dx spont L Ptx 9/08/15/09 requiring Chest tube but no surgery   11/23/2012 f/u ov/Keymani Mclean re WG/ bronchiectasis Chief Complaint  Patient presents with  . Follow-up    Had PFT today-sob same,cough-yellow,wheezing occass.,  no hemoptysis, no real limiting sob though sedentary, no arthralgias or rash maintaining 2.5 mg daily No change prednisone 5 mg one half daily  > ok to try every other day if doing great May 1 flare rx levaquin and pred May 16 lap chole [facility]   08/19/2013 f/u ov/Kevia Zaucha re: ? Recurrent WG Chief Complaint  Patient presents with  . Follow-up    Breathing is unchanged. Cough unchanged-prod cpugh w/ yellow phlem. On 2nd dose of cipro  now on day 5/10 cipro and less dark, never bloody, no sinus complaints, arthritis rash or fever or hematuria. >ANCA 1:80 titer, started on cytoxan          Patient ID: Richard [name]  MRN: [id number], DOB/AGE: Jan 24, 1936 80 y.o.  Admit date: 03/13/2014  D/C date: 03/26/2014   Primary Cardiologist: Dr. Debara Pickett  Principal Problem:  Unstable Class III Angina  Active Problems:  HYPOTHYROIDISM  HYPERLIPIDEMIA  ANEMIA  Wegener's granulomatosis  Chronic respiratory failure  Abnormal nuclear stress test: Intermediate risk with moderate region of apical and inferior scar with mild superimposed ischemia; EF 40%  CAD- severe 4 V CAD- not CABG candidate  Anemia- transfused 03/16/14  Chronic renal insufficiency, stage III (moderate)  Cardiomyopathy, ischemic- EF 40% by Myoview  Acute blood loss anemia  Femoral artery pseudo-aneurysm, right  Admission Dates: 03/13/14 - 03/26/14  Discharge Diagnosis: [place] s/p [facility] x3 and ultimately BMS to  pLAD& POBA to CTO of circumflex/marginal vessel on 03/17/14 and staged BMS to mRCA on 03/21/14  HPI: Richard [name] is a 80 y.o. male with a history of hypothyroidism, HLD, anemia, Wegener's granulomatosis and no prior cardiac disease who presented to [facility] ED on 03/13/14 with chest pain and was transferred to [facility] for cath the following morning.  PMH significant for Wegener's granulomatosis and follows with Dr. [name] for his pulmonary disease (severe multifocal bronchiectasis as well as numerous cavitary and non-cavitary nodules). He had been having chest tightness with significant exertion for the last few months with increasing dyspnea on exertion. Also had 2 brothers who both had CABG. Referred to Dr. Debara Pickett who proceeded with nuclear stress testing which demonstrated a "moderate region of apical and inferior scar with mild superimposed qualitative ischemia". LV wall motion demonstrated a "moderate degree of hypokinesis involving the mid-distal inferior wall and apex with an EF of 40%". Prior echo dated 03/30/2010 showed EF 45-50%.  Hospital Course: He presented to [facility] ED with increasing fatigue and chest tightness which developed that morning, which resolved within 10 minutes.  He was given ASA, heparin, and nitro paste. ECG demonstrated sinus tachycardia with no acute ST-T abnormalities. POC troponin normal. Also noted to be anemic, Hgb 9 (10 on 12/06/13). Dr. [name] noted that Mr. Clucas has had hemoptysis in the past, therefore limiting DAPT was ideal and BMS over DES thought to be the best option.  [place]/CAD:  -- Abnormal outpatient  nuclear stress test on 03/12/14: Intermediate risk with moderate region of apical and inferior scar with mild superimposed ischemia; EF 40%  -- s/p first LHC on 03/14/14 which revealed severe multivessel CAD involving all 4 Richard branches. Seen by Dr. Madolyn Frieze on this admission who deemed him unsuitable for CABG due to underlying comorbid conditions. ( sts score >30). It was elected to  proceed with staged multivessel PCI .  -- s/p LHC on 03/17/14 with successful PCI to the LAD and POBA to a chronically occluded LCX. There remained high grade proximal disease to a large proximal Ramus branch as well as mid-RCA disease so staged PCI was planned at a later date due to pulmonary issues and renal dysfunction. This was further complicated by right femoral artery pseudoaneurysm requiring repair. LHC on 03/21/14 s/p PCI with rotablator atherectomy with BMS to the mid RCA. Due to length of fluoro time and large volume of contrast, it was decided to medically manage the ramus stenosis which Dr. Angelena Form felt was moderately stenosed and if he has recurrent anginal then consider PCI.  -- Continue ASA/BB/Plavix/statin  Right femoral pseudoaneurysm: s/p suture repair of femoral artery with evacuation of hematoma on 03/19/14. Still with significant hematoma down buttox and hamstring but improving. CBC stable at 7.7.  -- Has follow up with Dr. Kellie Simmering on 04/08/2014  Anemia: subsequent to right femoral pseudoaneurysm- s/p surgical repair. S/p blood transfusion x 2. Hgb improved from 7.4 to 8.6. Stable at 7.7 on discharge.  --Will send home on Iron 324 mg daily with colace 100 mg BID to help prevent constipation  SOB - BNP elevated at 4937. D-Dimer elevated at 2.6. CTA with no acute PE. No s/s of volume overload  -- Though to be possibly due to anemia. Chest xray showed multiple cavitary lesions but no CHF. CTA with multiple abnormalities possibly reflecting acute bronchitis vs organizing PNA. PCCM (followed by Dr. [name]) consulted who felt that the CT was at his baseline and to continue current therapy. He thought his SOB was likely multifactorial and anemia also playing a role.  -- BNP elevated at 4937. D-Dimer elevated at 2.6. CTA with no acute PE. No s/s of volume overload  Chronic systolic CHF- 2 D ECHO with LVEF 40-45%, mid to distal inferoapical and apical hypokinesis, diastolic dysfunction with elevated  LV filling pressure, mild to moderate RV hypokinesis.  Wegener's granulomatosis. ( per PCCM)  -- Continue cytoxan, prednisone  -- Bactrim for PCP prophylaxis  -- Will need pulmonary rehab as an outpatient  Chronic renal insufficiency, stage III (moderate).  -- Creat elevated but stable after contrast exposure for CTA  -- Creat stable at 1.48 today. Continue to monitor  The patient has had a prolonged hospital course but is recovering well. The femoral catheter site is stable. He has been seen by Dr. Irish Lack today and deemed ready for discharge home. All follow-up appointments have been scheduled. Discharge medications are listed below.         09/02/2014 Follow up : Bronchiectasis/NP Patient returns for a two-week follow-up. Last visit. Patient with a bronchiectatic exacerbation. He was treated with Augmentin 10 days and a prednisone burst. Patient has started to slowly feel improved with decreased discolored mucus. Shortness of breath is not quite as bad. He has made very slow progress. He denies any hemoptysis, orthopnea, PND, leg swelling or calf pain. Appetite is fair. No nausea, vomiting or diarrhea. Chest x-ray last visit showed chronic changes rec Continue on current regimen  Continue with VEST  therapy.     10/30/2014 f/u ov/Zoie Sarin re: bronchiectasis/AB Chief Complaint  Patient presents with  . Follow-up    Pt c/o increased SOB and chest congestion for the past 5 days. Cough is prod with yellow sputum.     rx with levaquin x 2 days but only used saba twice ? How much can you use it if you are having a bad day with your breathing A Not sure Baseline = sob x more than slow adls rec Increase prednisone to 15 mg per day until better then back to 7.5 mg daily  Augmentin 875 mg take one pill twice daily  X 10 days - take at breakfast and supper with large glass of water.  It would help reduce the usual side effects (diarrhea and yeast infections) if you ate cultured yogurt at  lunch.  For breathing/coughing/ congestion >  Up to 2 puff every 4 hours as needed  Make sure you are taking pepcid 20 mg after bfast and supper  See Tammy NP w/in 2 weeks with all your medications > did not do     07/08/2015  f/u ov/Evelyn Moch re: bronchiectasis/ on pred 7.5 mg daily / 50 mg daily cytoxan per renal  Chief Complaint  Patient presents with  . Follow-up    Pt c/o continued dyspnea with exertion and wet cough with yellow mucus. Pt denies wheeze/CP/tightness.   not using flutter / not understanding contingencies prev listed on his home made med calendar but no longer using it anyway   maint on sym 160 2bid with baseline doe = MMRC1 = can walk nl pace, flat grade, can't hurry or go uphills or steps s sob  Not sure vest helping and not using as consistently   rec Levaquin 750 mg  Daily x 5-7 days when mucus gets nasty  Use the flutter valve just as much as you can   10/13/2015  f/u ov/Kayton Ripp re: WG/ bronchiectasis on 7 day cycles of levaquin/ not using flutter / is using vest up to an hour a day / pred 7.5/ctx 50  Chief Complaint  Patient presents with  . Follow-up    Breathing is unchanged. His cough is prod with moderate yellow sputum.   first thing in am is worst time for breathing and then takes symbicort 160 x 2 and better/ rare need for saba  Doe x MMRC1 = can walk nl pace, flat grade, can't hurry or go uphills or steps s sob   levaquin 750 x 7 days improves mucus and never bloody,  Helps for at least  2 weeks then usually  recurs  rec No change rx   01/07/16 exac seen in ER > rx pred 15/ augmentin   02/01/2016  f/u ov/Venson Ferencz re: obst bronchiectasis maint rx = symbicort 160 2 bid and duoneb bid Chief Complaint  Patient presents with  . Follow-up    PFT done today.  Pt went to ED in Bronchiectasis flare 01/07/16- was txed with Augmentin. He is using duoneb 2 x daily. His cough is prod with yellow sputum.  back to baseline doe = MMRC=1  but confused with meds again , esp  prns   No obvious day to day or daytime variability or assoc   mucus plugs or hemoptysis or cp or chest tightness, subjective wheeze or overt sinus or hb symptoms. No unusual exp hx or h/o childhood pna/ asthma or knowledge of premature birth.  Sleeping ok without nocturnal  or early am exacerbation  of  respiratory  c/o's or need for noct saba. Also denies any obvious fluctuation of symptoms with weather or environmental changes or other aggravating or alleviating factors except as outlined above   Current Medications, Allergies, Complete Past Medical History, Past Surgical History, Family History, and Social History were reviewed in [employer] record.  ROS  The following are not active complaints unless bolded sore throat, dysphagia, dental problems, itching, sneezing,  nasal congestion or excess/ purulent secretions, ear ache,   fever, chills, sweats, unintended wt loss, classically pleuritic or exertional cp,  orthopnea pnd or leg swelling, presyncope, palpitations, abdominal pain, anorexia, nausea, vomiting, diarrhea  or change in bowel or bladder habits, change in stools or urine, dysuria,hematuria,  rash, arthralgias, visual complaints, headache, numbness, weakness or ataxia or problems with walking or coordination,  change in mood/affect or memory.          Objective:   Physical Exam  wt   128  04/26/11 >07/08/2011  134> 134 11/28/2011 > 02/29/2012 136> 05/31/2012  136>  134 11/22/12 > 07/26/2013  136 > 08/21/2013  136 >133 09/06/2013 >  09/12/2013  133 > 132 09/27/2013 > 10/15/2013 131 > 11/07/2013  128 > 01/09/14 124 > 02/12/2014  123 > 04/11/2014   117 > 05/23/2014 119 > 08/19/2014 110 > 10/30/2014  120 >  04/09/2015 120 > 07/08/2015  119 > 10/13/2015  114 > 02/01/2016   117   Vital signs reviewed   amb thin pleasant wm nad  SKIN: no rash, lesions  NODES: no lymphadenopathy  HEENT: [place]/AT, EOM- WNL, Conjuctivae- clear, PERRLA, TM-WNL, Nose- clear, Throat- clear and wnl, Mallampati III   NECK: Supple w/ fair ROM, JVD- none, normal carotid impulses w/o bruits Thyroid-  CHEST: minimal exp rhonchi bilaterally  No resp distress HEART: RRR, no m/g/r heard  ABDOMEN: Soft and nl EXT:  No deformities or restrucitons          Assessment & Plan:

## 2016-02-01 NOTE — Progress Notes (Signed)
PFT done today. 

## 2016-02-01 NOTE — Assessment & Plan Note (Addendum)
-     PFT's 06/21/11   FEV1  1.60 (61%) ratio 48 and 10% better p B2,  DLCO 83%  - CT 10/04/13 severe multifocal bronchiectasis as well as numerous cavitary and      non-cavitary nodules  -   PFT's 11/22/2012 FEV1  1.52 (62%) arnd 46 and no change p B2 DLCO 89%  -    VEST rec 02/12/2014 > reported improvement 05/23/14   - PFT's  02/01/2016  FEV1 1.34 (51 % ) ratio 53  p 4 % improvement from saba p symbicort 160 prior to study with DLCO  44/46 % corrects to 85 % for alv volume       - 02/01/2016  After extensive coaching HFA effectiveness =    90%    Improved back to baseline on symb 160 2bid and pred 7.5 mg daily but more confused than usual with maint vs prns.  To keep things simple, I have asked the patient to first separate medicines that are perceived as maintenance, that is to be taken daily "no matter what", from those medicines that are taken on only on an as-needed basis and I have given the patient examples of both, and then return to see our NP to generate a  detailed  medication calendar which should be followed until the next physician sees the patient and updates it.      I had an extended discussion with the patient reviewing all relevant studies completed to date and  lasting 15 to 20 minutes of a 25 minute visit    Each maintenance medication was reviewed in detail including most importantly the difference between maintenance and prns and under what circumstances the prns are to be triggered using an action plan format that is not reflected in the computer generated alphabetically organized AVS but trather by a customized med calendar that reflects the AVS meds with confirmed 100% correlation.   Please see instructions for details which were reviewed in writing and the patient given a copy highlighting the part that I personally wrote and discussed at today's ov.

## 2016-02-02 ENCOUNTER — Encounter (HOSPITAL_COMMUNITY): Payer: Medicare Other

## 2016-02-02 ENCOUNTER — Ambulatory Visit: Payer: Medicare Other | Admitting: Internal Medicine

## 2016-02-03 ENCOUNTER — Other Ambulatory Visit (HOSPITAL_COMMUNITY): Payer: Self-pay | Admitting: *Deleted

## 2016-02-03 ENCOUNTER — Other Ambulatory Visit: Payer: Self-pay | Admitting: Internal Medicine

## 2016-02-03 MED ORDER — IPRATROPIUM-ALBUTEROL 0.5-2.5 (3) MG/3ML IN SOLN: 3.0000 mL | RESPIRATORY_TRACT | Status: AC | PRN

## 2016-02-04 ENCOUNTER — Encounter (HOSPITAL_COMMUNITY)
Admission: RE | Admit: 2016-02-04 | Discharge: 2016-02-04 | Disposition: A | Discharge status: Home / Self Care | Payer: Medicare Other | Source: Ambulatory Visit | Attending: Nephrology | Admitting: Nephrology

## 2016-02-04 DIAGNOSIS — D631 Anemia in chronic kidney disease: Secondary | ICD-10-CM | POA: Insufficient documentation

## 2016-02-04 DIAGNOSIS — Z79899 Other long term (current) drug therapy: Secondary | ICD-10-CM | POA: Diagnosis not present

## 2016-02-04 DIAGNOSIS — N183 Chronic kidney disease, stage 3 (moderate): Secondary | ICD-10-CM | POA: Diagnosis present

## 2016-02-04 DIAGNOSIS — Z5181 Encounter for therapeutic drug level monitoring: Secondary | ICD-10-CM | POA: Insufficient documentation

## 2016-02-04 DIAGNOSIS — D509 Iron deficiency anemia, unspecified: Secondary | ICD-10-CM | POA: Diagnosis not present

## 2016-02-04 LAB — POCT HEMOGLOBIN-HEMACUE: HEMOGLOBIN: 9.8 g/dL — AB (ref 13.0–17.0)

## 2016-02-04 MED ORDER — EPOETIN ALFA 20000 UNIT/ML IJ SOLN
INTRAMUSCULAR | Status: AC
Start: 2016-02-04 — End: 2016-02-04
  Administered 2016-02-04: 20000 [IU] via SUBCUTANEOUS
  Filled 2016-02-04: qty 1

## 2016-02-04 MED ORDER — EPOETIN ALFA 40000 UNIT/ML IJ SOLN: 30000.0000 [IU] | INTRAMUSCULAR | Status: DC

## 2016-02-04 MED ORDER — EPOETIN ALFA 10000 UNIT/ML IJ SOLN
INTRAMUSCULAR | Status: AC
  Administered 2016-02-04: 10000 [IU] via SUBCUTANEOUS
  Filled 2016-02-04: qty 1

## 2016-02-04 MED ORDER — SODIUM CHLORIDE 0.9 % IV SOLN
510.0000 mg | Freq: Once | INTRAVENOUS | Status: AC
  Administered 2016-02-04: 510 mg via INTRAVENOUS
  Filled 2016-02-04: qty 17

## 2016-02-05 ENCOUNTER — Encounter (HOSPITAL_COMMUNITY): Payer: Medicare Other

## 2016-02-12 ENCOUNTER — Encounter (HOSPITAL_COMMUNITY)
Admission: RE | Admit: 2016-02-12 | Discharge: 2016-02-12 | Disposition: A | Discharge status: Home / Self Care | Payer: Medicare Other | Source: Ambulatory Visit | Attending: Nephrology | Admitting: Nephrology

## 2016-02-12 DIAGNOSIS — Z5181 Encounter for therapeutic drug level monitoring: Secondary | ICD-10-CM | POA: Diagnosis not present

## 2016-02-12 DIAGNOSIS — D631 Anemia in chronic kidney disease: Secondary | ICD-10-CM | POA: Diagnosis not present

## 2016-02-12 DIAGNOSIS — N183 Chronic kidney disease, stage 3 (moderate): Secondary | ICD-10-CM | POA: Diagnosis not present

## 2016-02-12 DIAGNOSIS — Z79899 Other long term (current) drug therapy: Secondary | ICD-10-CM | POA: Insufficient documentation

## 2016-02-12 LAB — POCT HEMOGLOBIN-HEMACUE: HEMOGLOBIN: 9.3 g/dL — AB (ref 13.0–17.0)

## 2016-02-12 MED ORDER — EPOETIN ALFA 10000 UNIT/ML IJ SOLN
INTRAMUSCULAR | Status: AC
  Administered 2016-02-12: 10000 [IU]
  Filled 2016-02-12: qty 1

## 2016-02-12 MED ORDER — EPOETIN ALFA 20000 UNIT/ML IJ SOLN
INTRAMUSCULAR | Status: AC
  Administered 2016-02-12: 20000 [IU]
  Filled 2016-02-12: qty 1

## 2016-02-12 MED ORDER — EPOETIN ALFA 40000 UNIT/ML IJ SOLN: 30000.0000 [IU] | INTRAMUSCULAR | Status: DC

## 2016-02-17 ENCOUNTER — Encounter (HOSPITAL_COMMUNITY)
Admission: RE | Admit: 2016-02-17 | Discharge: 2016-02-17 | Disposition: A | Discharge status: Home / Self Care | Payer: Medicare Other | Source: Ambulatory Visit | Attending: Nephrology | Admitting: Nephrology

## 2016-02-17 DIAGNOSIS — N183 Chronic kidney disease, stage 3 (moderate): Secondary | ICD-10-CM | POA: Diagnosis not present

## 2016-02-17 LAB — IRON AND TIBC
IRON: 73 ug/dL (ref 45–182)
SATURATION RATIOS: 40 % — AB (ref 17.9–39.5)
TIBC: 185 ug/dL — ABNORMAL LOW (ref 250–450)
UIBC: 112 ug/dL

## 2016-02-17 LAB — FERRITIN: Ferritin: 572 ng/mL — ABNORMAL HIGH (ref 24–336)

## 2016-02-17 LAB — POCT HEMOGLOBIN-HEMACUE: Hemoglobin: 10.1 g/dL — ABNORMAL LOW (ref 13.0–17.0)

## 2016-02-17 MED ORDER — EPOETIN ALFA 20000 UNIT/ML IJ SOLN
INTRAMUSCULAR | Status: AC
  Administered 2016-02-17: 20000 [IU] via SUBCUTANEOUS
  Filled 2016-02-17: qty 1

## 2016-02-17 MED ORDER — EPOETIN ALFA 40000 UNIT/ML IJ SOLN: 30000.0000 [IU] | INTRAMUSCULAR | Status: DC

## 2016-02-17 MED ORDER — EPOETIN ALFA 10000 UNIT/ML IJ SOLN
INTRAMUSCULAR | Status: AC
  Administered 2016-02-17: 10000 [IU] via SUBCUTANEOUS
  Filled 2016-02-17: qty 1

## 2016-02-24 ENCOUNTER — Encounter (HOSPITAL_COMMUNITY)
Admission: RE | Admit: 2016-02-24 | Discharge: 2016-02-24 | Disposition: A | Discharge status: Home / Self Care | Payer: Medicare Other | Source: Ambulatory Visit | Attending: Nephrology | Admitting: Nephrology

## 2016-02-24 DIAGNOSIS — N183 Chronic kidney disease, stage 3 (moderate): Secondary | ICD-10-CM | POA: Diagnosis not present

## 2016-02-24 LAB — POCT HEMOGLOBIN-HEMACUE: HEMOGLOBIN: 10.1 g/dL — AB (ref 13.0–17.0)

## 2016-02-24 MED ORDER — EPOETIN ALFA 10000 UNIT/ML IJ SOLN
INTRAMUSCULAR | Status: AC
  Administered 2016-02-24: 10000 [IU]
  Filled 2016-02-24: qty 1

## 2016-02-24 MED ORDER — EPOETIN ALFA 20000 UNIT/ML IJ SOLN
INTRAMUSCULAR | Status: AC
  Administered 2016-02-24: 20000 [IU]
  Filled 2016-02-24: qty 1

## 2016-02-24 MED ORDER — EPOETIN ALFA 40000 UNIT/ML IJ SOLN: 30000.0000 [IU] | INTRAMUSCULAR | Status: DC

## 2016-02-29 ENCOUNTER — Ambulatory Visit (INDEPENDENT_AMBULATORY_CARE_PROVIDER_SITE_OTHER): Payer: Medicare Other | Admitting: Internal Medicine

## 2016-02-29 ENCOUNTER — Other Ambulatory Visit: Payer: Self-pay | Admitting: Family Medicine

## 2016-02-29 ENCOUNTER — Ambulatory Visit (INDEPENDENT_AMBULATORY_CARE_PROVIDER_SITE_OTHER)
Admission: RE | Admit: 2016-02-29 | Discharge: 2016-02-29 | Disposition: A | Discharge status: Home / Self Care | Payer: Medicare Other | Source: Ambulatory Visit | Attending: Internal Medicine | Admitting: Internal Medicine

## 2016-02-29 ENCOUNTER — Other Ambulatory Visit (INDEPENDENT_AMBULATORY_CARE_PROVIDER_SITE_OTHER): Payer: Medicare Other

## 2016-02-29 ENCOUNTER — Encounter: Payer: Self-pay | Admitting: Internal Medicine

## 2016-02-29 VITALS — BP 116/72 | HR 85 | Ht 69.0 in | Wt 118.0 lb

## 2016-02-29 DIAGNOSIS — J471 Bronchiectasis with (acute) exacerbation: Secondary | ICD-10-CM

## 2016-02-29 DIAGNOSIS — M3131 Wegener's granulomatosis with renal involvement: Secondary | ICD-10-CM

## 2016-02-29 DIAGNOSIS — J479 Bronchiectasis, uncomplicated: Secondary | ICD-10-CM

## 2016-02-29 LAB — CBC WITH DIFFERENTIAL/PLATELET
BASOS PCT: 0.1 % (ref 0.0–3.0)
Basophils Absolute: 0 10*3/uL (ref 0.0–0.1)
EOS ABS: 0 10*3/uL (ref 0.0–0.7)
EOS PCT: 0.3 % (ref 0.0–5.0)
HEMATOCRIT: 36.1 % — AB (ref 39.0–52.0)
Hemoglobin: 11.9 g/dL — ABNORMAL LOW (ref 13.0–17.0)
LYMPHS ABS: 0.5 10*3/uL — AB (ref 0.7–4.0)
Lymphocytes Relative: 5.4 % — ABNORMAL LOW (ref 12.0–46.0)
MCHC: 33 g/dL (ref 30.0–36.0)
MCV: 114.4 fl — ABNORMAL HIGH (ref 78.0–100.0)
MONOS PCT: 10.4 % (ref 3.0–12.0)
Monocytes Absolute: 0.9 10*3/uL (ref 0.1–1.0)
NEUTROS ABS: 7.1 10*3/uL (ref 1.4–7.7)
NEUTROS PCT: 83.8 % — AB (ref 43.0–77.0)
PLATELETS: 195 10*3/uL (ref 150.0–400.0)
RBC: 3.15 Mil/uL — ABNORMAL LOW (ref 4.22–5.81)
RDW: 17.2 % — AB (ref 11.5–15.5)
WBC: 8.5 10*3/uL (ref 4.0–10.5)

## 2016-02-29 LAB — SEDIMENTATION RATE: Sed Rate: 8 mm/hr (ref 0–20)

## 2016-02-29 LAB — BASIC METABOLIC PANEL
BUN: 30 mg/dL — ABNORMAL HIGH (ref 6–23)
CALCIUM: 9.3 mg/dL (ref 8.4–10.5)
CO2: 29 mEq/L (ref 19–32)
Chloride: 105 mEq/L (ref 96–112)
Creatinine, Ser: 2 mg/dL — ABNORMAL HIGH (ref 0.40–1.50)
GFR: 34.55 mL/min — AB (ref >60.00)
Glucose, Bld: 106 mg/dL — ABNORMAL HIGH (ref 70–99)
POTASSIUM: 5 meq/L (ref 3.5–5.1)
SODIUM: 141 meq/L (ref 135–145)

## 2016-02-29 NOTE — Progress Notes (Signed)
Subjective:    Patient ID: Richard [name], male    DOB: 1935-08-22     MRN: [id number]   Brief patient profile:  21  yowm never smoker with dx of Longstanding bronchiectasis then dx with WG 03/2010 > requested establish with Richard [name].   Admit [facility] dx WG by pos c anca 03/2010, supportive tbbx but not vats   Admit [name] dx spont L Ptx 9/08/15/09 requiring Chest tube but no surgery   11/23/2012 f/u ov/Richard [name] re WG/ bronchiectasis Chief Complaint  Patient presents with  . Follow-up    Had PFT today-sob same,cough-yellow,wheezing occass.,  no hemoptysis, no real limiting sob though sedentary, no arthralgias or rash maintaining 2.5 mg daily No change prednisone 5 mg one half daily  > ok to try every other day if doing great May 1 flare rx levaquin and pred May 16 lap chole [facility]   08/19/2013 f/u ov/Richard [name] re: ? Recurrent WG Chief Complaint  Patient presents with  . Follow-up    Breathing is unchanged. Cough unchanged-prod cpugh w/ yellow phlem. On 2nd dose of cipro  now on day 5/10 cipro and less dark, never bloody, no sinus complaints, arthritis rash or fever or hematuria. >ANCA 1:80 titer, started on cytoxan          Patient ID: Richard [name]  MRN: [id number], DOB/AGE: [birth year] 80 y.o.  Admit date: 03/13/2014  D/C date: 03/26/2014   Primary Cardiologist: Richard [name]  Principal Problem:  Unstable Class III Angina  Active Problems:  HYPOTHYROIDISM  HYPERLIPIDEMIA  ANEMIA  Wegener's granulomatosis  Chronic respiratory failure  Abnormal nuclear stress test: Intermediate risk with moderate region of apical and inferior scar with mild superimposed ischemia; EF 40%  CAD- severe 4 V CAD- not CABG candidate  Anemia- transfused 03/16/14  Chronic renal insufficiency, stage III (moderate)  Cardiomyopathy, ischemic- EF 40% by Myoview  Acute blood loss anemia  Femoral artery pseudo-aneurysm, right  Admission Dates: 03/13/14 - 03/26/14  Discharge Diagnosis: [place] s/p [facility] x3 and ultimately BMS to  pLAD& POBA to CTO of circumflex/marginal vessel on 03/17/14 and staged BMS to mRCA on 03/21/14  HPI: Richard [name] is a 80 y.o. male with a history of hypothyroidism, HLD, anemia, Wegener's granulomatosis and no prior cardiac disease who presented to [facility] ED on 03/13/14 with chest pain and was transferred to [facility] for cath the following morning.  PMH significant for Wegener's granulomatosis and follows with Richard [name] for his pulmonary disease (severe multifocal bronchiectasis as well as numerous cavitary and non-cavitary nodules). He had been having chest tightness with significant exertion for the last few months with increasing dyspnea on exertion. Also had 2 brothers who both had CABG. Referred to Richard [name] who proceeded with nuclear stress testing which demonstrated a "moderate region of apical and inferior scar with mild superimposed qualitative ischemia". LV wall motion demonstrated a "moderate degree of hypokinesis involving the mid-distal inferior wall and apex with an EF of 40%". Prior echo dated 03/30/2010 showed EF 45-50%.  Hospital Course: He presented to [facility] ED with increasing fatigue and chest tightness which developed that morning, which resolved within 10 minutes.  He was given ASA, heparin, and nitro paste. ECG demonstrated sinus tachycardia with no acute ST-T abnormalities. POC troponin normal. Also noted to be anemic, Hgb 9 (10 on 12/06/13). Richard [name] noted that Richard [name] has had hemoptysis in the past, therefore limiting DAPT was ideal and BMS over DES thought to be the best option.  [place]/CAD:  -- Abnormal outpatient  nuclear stress test on 03/12/14: Intermediate risk with moderate region of apical and inferior scar with mild superimposed ischemia; EF 40%  -- s/p first LHC on 03/14/14 which revealed severe multivessel CAD involving all 4 major branches. Seen by Richard [name] on this admission who deemed him unsuitable for CABG due to underlying comorbid conditions. ( sts score >30). It was elected to  proceed with staged multivessel PCI .  -- s/p LHC on 03/17/14 with successful PCI to the LAD and POBA to a chronically occluded LCX. There remained high grade proximal disease to a large proximal Ramus branch as well as mid-RCA disease so staged PCI was planned at a later date due to pulmonary issues and renal dysfunction. This was further complicated by right femoral artery pseudoaneurysm requiring repair. LHC on 03/21/14 s/p PCI with rotablator atherectomy with BMS to the mid RCA. Due to length of fluoro time and large volume of contrast, it was decided to medically manage the ramus stenosis which Richard [name] felt was moderately stenosed and if he has recurrent anginal then consider PCI.  -- Continue ASA/BB/Plavix/statin  Right femoral pseudoaneurysm: s/p suture repair of femoral artery with evacuation of hematoma on 03/19/14. Still with significant hematoma down buttox and hamstring but improving. CBC stable at 7.7.  -- Has follow up with Richard [name] on 04/08/2014  Anemia: subsequent to right femoral pseudoaneurysm- s/p surgical repair. S/p blood transfusion x 2. Hgb improved from 7.4 to 8.6. Stable at 7.7 on discharge.  --Will send home on Iron 324 mg daily with colace 100 mg BID to help prevent constipation  SOB - BNP elevated at 4937. D-Dimer elevated at 2.6. CTA with no acute PE. No s/s of volume overload  -- Though to be possibly due to anemia. Chest xray showed multiple cavitary lesions but no CHF. CTA with multiple abnormalities possibly reflecting acute bronchitis vs organizing PNA. PCCM (followed by Richard [name]) consulted who felt that the CT was at his baseline and to continue current therapy. He thought his SOB was likely multifactorial and anemia also playing a role.  -- BNP elevated at 4937. D-Dimer elevated at 2.6. CTA with no acute PE. No s/s of volume overload  Chronic systolic CHF- 2 D ECHO with LVEF 40-45%, mid to distal inferoapical and apical hypokinesis, diastolic dysfunction with elevated  LV filling pressure, mild to moderate RV hypokinesis.  Wegener's granulomatosis. ( per PCCM)  -- Continue cytoxan, prednisone  -- Bactrim for PCP prophylaxis  -- Will need pulmonary rehab as an outpatient  Chronic renal insufficiency, stage III (moderate).  -- Creat elevated but stable after contrast exposure for CTA  -- Creat stable at 1.48 today. Continue to monitor  The patient has had a prolonged hospital course but is recovering well. The femoral catheter site is stable. He has been seen by Dr. Irish Lack today and deemed ready for discharge home. All follow-up appointments have been scheduled. Discharge medications are listed below.         09/02/2014 Follow up : Bronchiectasis/NP Patient returns for a two-week follow-up. Last visit. Patient with a bronchiectatic exacerbation. He was treated with Augmentin 10 days and a prednisone burst. Patient has started to slowly feel improved with decreased discolored mucus. Shortness of breath is not quite as bad. He has made very slow progress. He denies any hemoptysis, orthopnea, PND, leg swelling or calf pain. Appetite is fair. No nausea, vomiting or diarrhea. Chest x-ray last visit showed chronic changes rec Continue on current regimen  Continue with VEST  therapy.     10/30/2014 f/u ov/Neftali Abair re: bronchiectasis/AB Chief Complaint  Patient presents with  . Follow-up    Pt c/o increased SOB and chest congestion for the past 5 days. Cough is prod with yellow sputum.     rx with levaquin x 2 days but only used saba twice ? How much can you use it if you are having a bad day with your breathing A Not sure Baseline = sob x more than slow adls rec Increase prednisone to 15 mg per day until better then back to 7.5 mg daily  Augmentin 875 mg take one pill twice daily  X 10 days - take at breakfast and supper with large glass of water.  It would help reduce the usual side effects (diarrhea and yeast infections) if you ate cultured yogurt at  lunch.  For breathing/coughing/ congestion >  Up to 2 puff every 4 hours as needed  Make sure you are taking pepcid 20 mg after bfast and supper  See Tammy NP w/in 2 weeks with all your medications > did not do     07/08/2015  f/u ov/Ahmya Bernick re: bronchiectasis/ on pred 7.5 mg daily / 50 mg daily cytoxan per renal  Chief Complaint  Patient presents with  . Follow-up    Pt c/o continued dyspnea with exertion and wet cough with yellow mucus. Pt denies wheeze/CP/tightness.   not using flutter / not understanding contingencies prev listed on his home made med calendar but no longer using it anyway   maint on sym 160 2bid with baseline doe = MMRC1 = can walk nl pace, flat grade, can't hurry or go uphills or steps s sob  Not sure vest helping and not using as consistently   rec Levaquin 750 mg  Daily x 5-7 days when mucus gets nasty  Use the flutter valve just as much as you can   10/13/2015  f/u ov/Vann Okerlund re: WG/ bronchiectasis on 7 day cycles of levaquin/ not using flutter / is using vest up to an hour a day / pred 7.5/ctx 50  Chief Complaint  Patient presents with  . Follow-up    Breathing is unchanged. His cough is prod with moderate yellow sputum.   first thing in am is worst time for breathing and then takes symbicort 160 x 2 and better/ rare need for saba  Doe x MMRC1 = can walk nl pace, flat grade, can't hurry or go uphills or steps s sob   levaquin 750 x 7 days improves mucus and never bloody,  Helps for at least  2 weeks then usually  recurs  rec No change rx   01/07/16 exac seen in ER > rx pred 15/ augmentin   02/01/2016  f/u ov/Kerith Sherley re: obst bronchiectasis maint rx = symbicort 160 2 bid and duoneb bid Chief Complaint  Patient presents with  . Follow-up    PFT done today.  Pt went to ED in Bronchiectasis flare 01/07/16- was txed with Augmentin. He is using duoneb 2 x daily. His cough is prod with yellow sputum.  back to baseline doe = MMRC=1  but confused with meds again , esp  prns rec Plan A = Automatic = Symbicort 160 Take 2 puffs first thing in am and then another 2 puffs about 12 hours later.  Plan B = Backup Only use your albuterol (Red =Proair/ yellow = Proventil) as a rescue medication  Plan C = Crisis - only use your albuterol/ipatropium(duoneb) nebulizer if you first try  Plan B   02/29/2016 acute extended ov/Dawaun Brancato re:  Hemoptysis /obst bronchiectasis on symb 160 2bid Chief Complaint  Patient presents with  . Acute Visit    Pt c/o hemoptysis since 7/22. Blood is bright red in color. He states he feels very fatigued today.   coughing more than usual x 02/27/16 pm a total of about one half cup of blood and turned yellow am of ov but has used none of his prev contingency /action plans and is due in next week for a formal med calendar that will provide this in writing  Doe no change baseline and no increase in saba use    No obvious day to day or daytime variability or assoc   mucus plugs   or cp or chest tightness, subjective wheeze or overt sinus or hb symptoms. No unusual exp hx or h/o childhood pna/ asthma or knowledge of premature birth.  Sleeping ok without nocturnal  or early am exacerbation  of respiratory  c/o's or need for noct saba. Also denies any obvious fluctuation of symptoms with weather or environmental changes or other aggravating or alleviating factors except as outlined above   Current Medications, Allergies, Complete Past Medical History, Past Surgical History, Family History, and Social History were reviewed in [employer] record.  ROS  The following are not active complaints unless bolded sore throat, dysphagia, dental problems, itching, sneezing,  nasal congestion or excess/ purulent secretions, ear ache,   fever, chills, sweats, unintended wt loss, classically pleuritic or exertional cp,  orthopnea pnd or leg swelling, presyncope, palpitations, abdominal pain, anorexia, nausea, vomiting, diarrhea  or change in bowel  or bladder habits, change in stools or urine, dysuria,hematuria,  rash, arthralgias, visual complaints, headache, numbness, weakness or ataxia or problems with walking or coordination,  change in mood/affect or memory.          Objective:   Physical Exam  wt   128  04/26/11 >07/08/2011  134> 134 11/28/2011 > 02/29/2012 136> 05/31/2012  136>  134 11/22/12 > 07/26/2013  136 > 08/21/2013  136 >133 09/06/2013 >  09/12/2013  133 > 132 09/27/2013 > 10/15/2013 131 > 11/07/2013  128 > 01/09/14 124 > 02/12/2014  123 > 04/11/2014   117 > 05/23/2014 119 > 08/19/2014 110 > 10/30/2014  120 >  04/09/2015 120 > 07/08/2015  119 > 10/13/2015  114 > 02/01/2016   117 > 02/29/2016   118   Vital signs reviewed -  sats 95% RA at rest  amb thin pleasant wm nad  SKIN: no rash, lesions  NODES: no lymphadenopathy  HEENT: [place]/AT, EOM- WNL, Conjuctivae- clear, PERRLA, TM-WNL, Nose- clear, Throat- clear and wnl, Mallampati III  NECK: Supple w/ fair ROM, JVD- none, normal carotid impulses w/o bruits Thyroid-  CHEST: minimal insp/exp rhonchi bilaterally  No resp distress HEART: RRR, no m/g/r heard  ABDOMEN: Soft and nl EXT:  No deformities or restrictions      CXR PA and Lateral:   02/29/2016 :    I personally reviewed images and agree with radiology impression as follows:    Chronic interstitial prominence with lower lobe predominance consistent with known bronchiectasis no alveolar pneumonia nor CHF. Large hiatal hernia. Aortic atherosclerosis.    Labs ordered/ reviewed:      Chemistry      Component Value Date/Time   NA 141 02/29/2016 1614   K 5.0 02/29/2016 1614   CL 105 02/29/2016 1614   CO2 29 02/29/2016 1614   BUN  30 (H) 02/29/2016 1614   CREATININE 2.00 (H) 02/29/2016 1614   CREATININE 1.92 (H) 03/24/2015 0919      Component Value Date/Time   CALCIUM 9.3 02/29/2016 1614   CALCIUM 8.7 06/09/2015 1342   ALKPHOS 76 01/07/2016 1507   AST 16 01/07/2016 1507   ALT 9 (L) 01/07/2016 1507   BILITOT 0.8 01/07/2016 1507         Lab Results  Component Value Date   WBC 8.5 02/29/2016   HGB 11.9 (L) 02/29/2016   HCT 36.1 (L) 02/29/2016   MCV 114.4 Repeated and verified X2. (H) 02/29/2016   PLT 195.0 02/29/2016          Lab Results  Component Value Date   ESRSEDRATE 8 02/29/2016   ESRSEDRATE 48 (H) 02/12/2014   ESRSEDRATE 43 (H) 12/06/2013              Assessment & Plan:

## 2016-02-29 NOTE — Patient Instructions (Addendum)
Any time you notice any bleeding hold aspirin until completely gone and get off your feet   For cough >  delysm 2 tsp every 12 hours and flutter valve  For congestion >  mucinex up to 1200 mg every 12 hours and use the flutter valve as much as possible  For change in mucus color > levaquin x 7 days   Please remember to go to the  x-ray department downstairs for your tests - we will call you with the results when they are available.

## 2016-03-01 ENCOUNTER — Encounter: Payer: Self-pay | Admitting: Internal Medicine

## 2016-03-01 NOTE — Progress Notes (Signed)
Spoke with pt and notified of results per Dr. [name]. Pt verbalized understanding and denied any questions. 

## 2016-03-01 NOTE — Assessment & Plan Note (Signed)
Lab Results  Component Value Date   CREATININE 2.00 (H) 02/29/2016   CREATININE 2.03 (H) 01/07/2016   CREATININE 2.15 (H) 06/09/2015   CREATININE 1.92 (H) 03/24/2015   CREATININE 1.58 (H) 05/06/2014   CREATININE 1.61 (H) 04/15/2014     No evidence of a flare by cxr or labs > f/u renal

## 2016-03-01 NOTE — Assessment & Plan Note (Signed)
-     PFT's 06/21/11   FEV1  1.60 (61%) ratio 48 and 10% better p B2,  DLCO 83%  - CT 10/04/13 severe multifocal bronchiectasis as well as numerous cavitary and      non-cavitary nodules  -   PFT's 11/22/2012 FEV1  1.52 (62%) arnd 46 and no change p B2 DLCO 89%  -    VEST rec 02/12/2014 > reported improvement 05/23/14  - 07/08/2015  extensive coaching HFA effectiveness =  90%    - 02/01/2016  After extensive coaching HFA effectiveness =    90%  - PFT's  02/01/2016  FEV1 1.34 (51 % ) ratio 53  p 4 % improvement from saba p symbicort 160 prior to study with DLCO  44/46 % corrects to 85 % for alv volume    Mild flare assoc with change in sputum and mild hemoptysis and now following the plan already given in writing:  Levaquin/ hold asa/max mucinex/ flutter  I had an extended discussion with the patient reviewing all relevant studies completed to date and  lasting 15 to 20 minutes of a 25 minute visit    Each maintenance medication was reviewed in detail including most importantly the difference between maintenance and prns and under what circumstances the prns are to be triggered using an action plan format that is not reflected in the computer generated alphabetically organized AVS.    Please see instructions for details which were reviewed in writing and the patient given a copy highlighting the part that I personally wrote and discussed at today's ov.

## 2016-03-02 ENCOUNTER — Encounter (HOSPITAL_COMMUNITY)
Admission: RE | Admit: 2016-03-02 | Discharge: 2016-03-02 | Disposition: A | Discharge status: Home / Self Care | Payer: Medicare Other | Source: Ambulatory Visit | Attending: Nephrology | Admitting: Nephrology

## 2016-03-02 DIAGNOSIS — N184 Chronic kidney disease, stage 4 (severe): Secondary | ICD-10-CM

## 2016-03-02 DIAGNOSIS — N183 Chronic kidney disease, stage 3 (moderate): Secondary | ICD-10-CM | POA: Diagnosis not present

## 2016-03-02 LAB — POCT HEMOGLOBIN-HEMACUE: HEMOGLOBIN: 10.7 g/dL — AB (ref 13.0–17.0)

## 2016-03-02 MED ORDER — EPOETIN ALFA 20000 UNIT/ML IJ SOLN
INTRAMUSCULAR | Status: AC
  Administered 2016-03-02: 20000 [IU]
  Filled 2016-03-02: qty 1

## 2016-03-02 MED ORDER — EPOETIN ALFA 10000 UNIT/ML IJ SOLN
INTRAMUSCULAR | Status: AC
  Administered 2016-03-02: 10000 [IU]
  Filled 2016-03-02: qty 1

## 2016-03-02 MED ORDER — EPOETIN ALFA 40000 UNIT/ML IJ SOLN: 30000.0000 [IU] | INTRAMUSCULAR | Status: DC

## 2016-03-08 ENCOUNTER — Ambulatory Visit (INDEPENDENT_AMBULATORY_CARE_PROVIDER_SITE_OTHER): Payer: Medicare Other | Admitting: Adult Health

## 2016-03-08 ENCOUNTER — Encounter: Payer: Self-pay | Admitting: Adult Health

## 2016-03-08 DIAGNOSIS — J471 Bronchiectasis with (acute) exacerbation: Secondary | ICD-10-CM | POA: Diagnosis not present

## 2016-03-08 DIAGNOSIS — J9611 Chronic respiratory failure with hypoxia: Secondary | ICD-10-CM | POA: Diagnosis not present

## 2016-03-08 NOTE — Assessment & Plan Note (Signed)
Cont on O2 At bedtime   

## 2016-03-08 NOTE — Addendum Note (Signed)
Addended by: Osa Craver on: 03/08/2016 03:58 PM   Modules accepted: Orders

## 2016-03-08 NOTE — Patient Instructions (Signed)
Continue on current regimen  Follow med calendar closely and bring to each visit.  follow up Dr. [name]  In 3 months and As needed   

## 2016-03-08 NOTE — Assessment & Plan Note (Signed)
Recent exacerbation, with hemoptysis, now resolved with antibiotics Patient's medications were reviewed today and patient education was given. Computerized medication calendar was adjusted/completed   Plan  Continue on current regimen  Follow med calendar closely and bring to each visit.  follow up Dr. [name]  In 3 months and As needed

## 2016-03-08 NOTE — Progress Notes (Signed)
Subjective:    Patient ID: Richard [name], male    DOB: [birth year]     MRN: [id number]   Brief patient profile:  77  yowm never smoker with dx of Longstanding bronchiectasis then dx with WG 03/2010 > requested establish with [name].   Admit [place] dx WG by pos c anca 03/2010, supportive tbbx but not vats   Admit [name] dx spont L Ptx 9/08/15/09 requiring Chest tube but no surgery   11/23/2012 f/u ov/[name] re WG/ bronchiectasis Chief Complaint  Patient presents with  . Follow-up    Had PFT today-sob same,cough-yellow,wheezing occass.,  no hemoptysis, no real limiting sob though sedentary, no arthralgias or rash maintaining 2.5 mg daily No change prednisone 5 mg one half daily  > ok to try every other day if doing great May 1 flare rx levaquin and pred May 16 lap chole [facility]   08/19/2013 f/u ov/[name] re: ? Recurrent WG Chief Complaint  Patient presents with  . Follow-up    Breathing is unchanged. Cough unchanged-prod cpugh w/ yellow phlem. On 2nd dose of cipro  now on day 5/10 cipro and less dark, never bloody, no sinus complaints, arthritis rash or fever or hematuria. >ANCA 1:80 titer, started on cytoxan          Patient ID: Richard [name]  MRN: [id number], DOB/AGE: Dec 08, 1935 80 y.o.  Admit date: 03/13/2014  D/C date: 03/26/2014   Primary Cardiologist: Dr. Debara Pickett  Principal Problem:  Unstable Class III Angina  Active Problems:  HYPOTHYROIDISM  HYPERLIPIDEMIA  ANEMIA  Wegener's granulomatosis  Chronic respiratory failure  Abnormal nuclear stress test: Intermediate risk with moderate region of apical and inferior scar with mild superimposed ischemia; EF 40%  CAD- severe 4 V CAD- not CABG candidate  Anemia- transfused 03/16/14  Chronic renal insufficiency, stage III (moderate)  Cardiomyopathy, ischemic- EF 40% by Myoview  Acute blood loss anemia  Femoral artery pseudo-aneurysm, right  Admission Dates: 03/13/14 - 03/26/14  Discharge Diagnosis: [place] s/p [facility] x3 and ultimately BMS to  pLAD& POBA to CTO of circumflex/marginal vessel on 03/17/14 and staged BMS to mRCA on 03/21/14  HPI: Richard [name] is a 80 y.o. male with a history of hypothyroidism, HLD, anemia, Wegener's granulomatosis and no prior cardiac disease who presented to [facility] ED on 03/13/14 with chest pain and was transferred to [facility] for cath the following morning.  PMH significant for Wegener's granulomatosis and follows with Dr. [name] for his pulmonary disease (severe multifocal bronchiectasis as well as numerous cavitary and non-cavitary nodules). He had been having chest tightness with significant exertion for the last few months with increasing dyspnea on exertion. Also had 2 brothers who both had CABG. Referred to Dr. Debara Pickett who proceeded with nuclear stress testing which demonstrated a "moderate region of apical and inferior scar with mild superimposed qualitative ischemia". LV wall motion demonstrated a "moderate degree of hypokinesis involving the mid-distal inferior wall and apex with an EF of 40%". Prior echo dated 03/30/2010 showed EF 45-50%.  Hospital Course: He presented to [facility] ED with increasing fatigue and chest tightness which developed that morning, which resolved within 10 minutes.  He was given ASA, heparin, and nitro paste. ECG demonstrated sinus tachycardia with no acute ST-T abnormalities. POC troponin normal. Also noted to be anemic, Hgb 9 (10 on 12/06/13). Dr. [name] noted that Mr. Raba has had hemoptysis in the past, therefore limiting DAPT was ideal and BMS over DES thought to be the best option.  [place]/CAD:  -- Abnormal outpatient  nuclear stress test on 03/12/14: Intermediate risk with moderate region of apical and inferior scar with mild superimposed ischemia; EF 40%  -- s/p first LHC on 03/14/14 which revealed severe multivessel CAD involving all 4 major branches. Seen by Dr. Madolyn Frieze on this admission who deemed him unsuitable for CABG due to underlying comorbid conditions. ( sts score >30). It was elected to  proceed with staged multivessel PCI .  -- s/p LHC on 03/17/14 with successful PCI to the LAD and POBA to a chronically occluded LCX. There remained high grade proximal disease to a large proximal Ramus branch as well as mid-RCA disease so staged PCI was planned at a later date due to pulmonary issues and renal dysfunction. This was further complicated by right femoral artery pseudoaneurysm requiring repair. LHC on 03/21/14 s/p PCI with rotablator atherectomy with BMS to the mid RCA. Due to length of fluoro time and large volume of contrast, it was decided to medically manage the ramus stenosis which Dr. Angelena Form felt was moderately stenosed and if he has recurrent anginal then consider PCI.  -- Continue ASA/BB/Plavix/statin  Right femoral pseudoaneurysm: s/p suture repair of femoral artery with evacuation of hematoma on 03/19/14. Still with significant hematoma down buttox and hamstring but improving. CBC stable at 7.7.  -- Has follow up with Dr. Kellie Simmering on 04/08/2014  Anemia: subsequent to right femoral pseudoaneurysm- s/p surgical repair. S/p blood transfusion x 2. Hgb improved from 7.4 to 8.6. Stable at 7.7 on discharge.  --Will send home on Iron 324 mg daily with colace 100 mg BID to help prevent constipation  SOB - BNP elevated at 4937. D-Dimer elevated at 2.6. CTA with no acute PE. No s/s of volume overload  -- Though to be possibly due to anemia. Chest xray showed multiple cavitary lesions but no CHF. CTA with multiple abnormalities possibly reflecting acute bronchitis vs organizing PNA. PCCM (followed by Dr. [name]) consulted who felt that the CT was at his baseline and to continue current therapy. He thought his SOB was likely multifactorial and anemia also playing a role.  -- BNP elevated at 4937. D-Dimer elevated at 2.6. CTA with no acute PE. No s/s of volume overload  Chronic systolic CHF- 2 D ECHO with LVEF 40-45%, mid to distal inferoapical and apical hypokinesis, diastolic dysfunction with elevated  LV filling pressure, mild to moderate RV hypokinesis.  Wegener's granulomatosis. ( per PCCM)  -- Continue cytoxan, prednisone  -- Bactrim for PCP prophylaxis  -- Will need pulmonary rehab as an outpatient  Chronic renal insufficiency, stage III (moderate).  -- Creat elevated but stable after contrast exposure for CTA  -- Creat stable at 1.48 today. Continue to monitor  The patient has had a prolonged hospital course but is recovering well. The femoral catheter site is stable. He has been seen by Dr. Irish Lack today and deemed ready for discharge home. All follow-up appointments have been scheduled. Discharge medications are listed below.         09/02/2014 Follow up : Bronchiectasis/NP Patient returns for a two-week follow-up. Last visit. Patient with a bronchiectatic exacerbation. He was treated with Augmentin 10 days and a prednisone burst. Patient has started to slowly feel improved with decreased discolored mucus. Shortness of breath is not quite as bad. He has made very slow progress. He denies any hemoptysis, orthopnea, PND, leg swelling or calf pain. Appetite is fair. No nausea, vomiting or diarrhea. Chest x-ray last visit showed chronic changes rec Continue on current regimen  Continue with VEST  therapy.     10/30/2014 f/u ov/[name] re: bronchiectasis/AB Chief Complaint  Patient presents with  . Follow-up    Pt c/o increased SOB and chest congestion for the past 5 days. Cough is prod with yellow sputum.     rx with levaquin x 2 days but only used saba twice ? How much can you use it if you are having a bad day with your breathing A Not sure Baseline = sob x more than slow adls rec Increase prednisone to 15 mg per day until better then back to 7.5 mg daily  Augmentin 875 mg take one pill twice daily  X 10 days - take at breakfast and supper with large glass of water.  It would help reduce the usual side effects (diarrhea and yeast infections) if you ate cultured yogurt at  lunch.  For breathing/coughing/ congestion >  Up to 2 puff every 4 hours as needed  Make sure you are taking pepcid 20 mg after bfast and supper  See Jaci Desanto NP w/in 2 weeks with all your medications > did not do     07/08/2015  f/u ov/[name] re: bronchiectasis/ on pred 7.5 mg daily / 50 mg daily cytoxan per renal  Chief Complaint  Patient presents with  . Follow-up    Pt c/o continued dyspnea with exertion and wet cough with yellow mucus. Pt denies wheeze/CP/tightness.   not using flutter / not understanding contingencies prev listed on his home made med calendar but no longer using it anyway   maint on sym 160 2bid with baseline doe = MMRC1 = can walk nl pace, flat grade, can't hurry or go uphills or steps s sob  Not sure vest helping and not using as consistently   rec Levaquin 750 mg  Daily x 5-7 days when mucus gets nasty  Use the flutter valve just as much as you can   10/13/2015  f/u ov/[name] re: WG/ bronchiectasis on 7 day cycles of levaquin/ not using flutter / is using vest up to an hour a day / pred 7.5/ctx 50  Chief Complaint  Patient presents with  . Follow-up    Breathing is unchanged. His cough is prod with moderate yellow sputum.   first thing in am is worst time for breathing and then takes symbicort 160 x 2 and better/ rare need for saba  Doe x MMRC1 = can walk nl pace, flat grade, can't hurry or go uphills or steps s sob   levaquin 750 x 7 days improves mucus and never bloody,  Helps for at least  2 weeks then usually  recurs  rec No change rx   01/07/16 exac seen in ER > rx pred 15/ augmentin   02/01/2016  f/u ov/[name] re: obst bronchiectasis maint rx = symbicort 160 2 bid and duoneb bid Chief Complaint  Patient presents with  . Follow-up    PFT done today.  Pt went to ED in Bronchiectasis flare 01/07/16- was txed with Augmentin. He is using duoneb 2 x daily. His cough is prod with yellow sputum.  back to baseline doe = MMRC=1  but confused with meds again , esp  prns rec Plan A = Automatic = Symbicort 160 Take 2 puffs first thing in am and then another 2 puffs about 12 hours later.  Plan B = Backup Only use your albuterol (Red =Proair/ yellow = Proventil) as a rescue medication  Plan C = Crisis - only use your albuterol/ipatropium(duoneb) nebulizer if you first try  Plan B   02/29/2016 acute extended ov/[name] re:  Hemoptysis /obst bronchiectasis on symb 160 2bid Chief Complaint  Patient presents with  . Acute Visit    Pt c/o hemoptysis since 7/22. Blood is bright red in color. He states he feels very fatigued today.   coughing more than usual x 02/27/16 pm a total of about one half cup of blood and turned yellow am of ov but has used none of his prev contingency /action plans and is due in next week for a formal med calendar that will provide this in writing  Doe no change baseline and no increase in saba use  >>hold asa , levaquin x 7   03/08/2016 Follow up : Bronchiectaisis  Patient returns for a one-week follow-up. Patient had a recent flare bronchiectasis. Last visit with hemoptysis. Was instructed to hold aspirin. Started on Levaquin for 1 week. Chest x-ray shows chronic changes of bronchiectasis. Labs showed stable hemoglobin. Sedimentation rate was normal at 8. Since last visit. Patient is feeling better. Hemoptysis has stopped.  We reviewed all his medications organize them into a medication count with patient education Appears to taking correctly.  We discussed using flutter valve more.  He could not use VEST in past.  Patient denies any chest pain, orthopnea, PND, or increased leg swelling.   Current Medications, Allergies, Complete Past Medical History, Past Surgical History, Family History, and Social History were reviewed in [employer] record.  ROS  The following are not active complaints unless bolded sore throat, dysphagia, dental problems, itching, sneezing,  nasal congestion or excess/ purulent secretions,  ear ache,   fever, chills, sweats, unintended wt loss, classically pleuritic or exertional cp,  orthopnea pnd or leg swelling, presyncope, palpitations, abdominal pain, anorexia, nausea, vomiting, diarrhea  or change in bowel or bladder habits, change in stools or urine, dysuria,hematuria,  rash, arthralgias, visual complaints, headache, numbness, weakness or ataxia or problems with walking or coordination,  change in mood/affect or memory.          Objective:   Physical Exam  wt   128  04/26/11 >07/08/2011  134> 134 11/28/2011 > 02/29/2012 136> 05/31/2012  136>  134 11/22/12 > 07/26/2013  136 > 08/21/2013  136 >133 09/06/2013 >  09/12/2013  133 > 132 09/27/2013 > 10/15/2013 131 > 11/07/2013  128 > 01/09/14 124 > 02/12/2014  123 > 04/11/2014   117 > 05/23/2014 119 > 08/19/2014 110 > 10/30/2014  120 >  04/09/2015 120 > 07/08/2015  119 > 10/13/2015  114 > 02/01/2016   117 > 02/29/2016   118 >119  Vital signs reviewed -  Vitals:   03/08/16 1455  BP: 108/66  Pulse: 90  Temp: 98.3 F (36.8 C)  TempSrc: Oral  SpO2: 93%  Weight: 119 lb (54 kg)  Height: 5\' 7"  (1.702 m)     amb thin pleasant wm nad  SKIN: no rash, lesions  NODES: no lymphadenopathy  HEENT: [place]/AT, EOM- WNL, Conjuctivae- clear, PERRLA, TM-WNL, Nose- clear, Throat- clear and wnl, Mallampati III  NECK: Supple w/ fair ROM, JVD- none, normal carotid impulses w/o bruits Thyroid-  CHEST: minimal insp/exp rhonchi bilaterally  No resp distress HEART: RRR, no m/g/r heard  ABDOMEN: Soft and nl EXT:  No deformities or restrictions      CXR PA and Lateral:   02/29/2016 :      Chronic interstitial prominence with lower lobe predominance consistent with known bronchiectasis no alveolar pneumonia nor CHF. Large hiatal hernia. Aortic  atherosclerosis.    Labs ordered/ reviewed:      Chemistry      Component Value Date/Time   NA 141 02/29/2016 1614   K 5.0 02/29/2016 1614   CL 105 02/29/2016 1614   CO2 29 02/29/2016 1614   BUN 30 (H) 02/29/2016 1614    CREATININE 2.00 (H) 02/29/2016 1614   CREATININE 1.92 (H) 03/24/2015 0919      Component Value Date/Time   CALCIUM 9.3 02/29/2016 1614   CALCIUM 8.7 06/09/2015 1342   ALKPHOS 76 01/07/2016 1507   AST 16 01/07/2016 1507   ALT 9 (L) 01/07/2016 1507   BILITOT 0.8 01/07/2016 1507        Lab Results  Component Value Date   WBC 8.5 02/29/2016   HGB 11.9 (L) 02/29/2016   HCT 36.1 (L) 02/29/2016   MCV 114.4 Repeated and verified X2. (H) 02/29/2016   PLT 195.0 02/29/2016          Lab Results  Component Value Date   ESRSEDRATE 8 02/29/2016   ESRSEDRATE 48 (H) 02/12/2014   ESRSEDRATE 43 (H) 12/06/2013

## 2016-03-09 ENCOUNTER — Encounter (HOSPITAL_COMMUNITY)
Admission: RE | Admit: 2016-03-09 | Discharge: 2016-03-09 | Disposition: A | Discharge status: Home / Self Care | Payer: Medicare Other | Source: Ambulatory Visit | Attending: Nephrology | Admitting: Nephrology

## 2016-03-09 DIAGNOSIS — D631 Anemia in chronic kidney disease: Secondary | ICD-10-CM | POA: Diagnosis not present

## 2016-03-09 DIAGNOSIS — N183 Chronic kidney disease, stage 3 (moderate): Secondary | ICD-10-CM | POA: Diagnosis not present

## 2016-03-09 DIAGNOSIS — Z5181 Encounter for therapeutic drug level monitoring: Secondary | ICD-10-CM | POA: Diagnosis not present

## 2016-03-09 DIAGNOSIS — N184 Chronic kidney disease, stage 4 (severe): Secondary | ICD-10-CM

## 2016-03-09 DIAGNOSIS — Z79899 Other long term (current) drug therapy: Secondary | ICD-10-CM | POA: Diagnosis not present

## 2016-03-09 LAB — POCT HEMOGLOBIN-HEMACUE: Hemoglobin: 10.9 g/dL — ABNORMAL LOW (ref 13.0–17.0)

## 2016-03-09 MED ORDER — EPOETIN ALFA 10000 UNIT/ML IJ SOLN
INTRAMUSCULAR | Status: AC
  Administered 2016-03-09: 10000 [IU]
  Filled 2016-03-09: qty 1

## 2016-03-09 MED ORDER — EPOETIN ALFA 20000 UNIT/ML IJ SOLN
INTRAMUSCULAR | Status: AC
  Administered 2016-03-09: 20000 [IU]
  Filled 2016-03-09: qty 1

## 2016-03-09 MED ORDER — EPOETIN ALFA 40000 UNIT/ML IJ SOLN: 30000.0000 [IU] | INTRAMUSCULAR | Status: DC

## 2016-03-10 NOTE — Progress Notes (Signed)
Chart and office note reviewed in detail  > agree with a/p as outlined    

## 2016-03-15 ENCOUNTER — Encounter (HOSPITAL_COMMUNITY)
Admission: RE | Admit: 2016-03-15 | Discharge: 2016-03-15 | Disposition: A | Discharge status: Home / Self Care | Payer: Medicare Other | Source: Ambulatory Visit | Attending: Nephrology | Admitting: Nephrology

## 2016-03-15 DIAGNOSIS — N184 Chronic kidney disease, stage 4 (severe): Secondary | ICD-10-CM

## 2016-03-15 DIAGNOSIS — N183 Chronic kidney disease, stage 3 (moderate): Secondary | ICD-10-CM | POA: Diagnosis not present

## 2016-03-15 LAB — POCT HEMOGLOBIN-HEMACUE: Hemoglobin: 11.1 g/dL — ABNORMAL LOW (ref 13.0–17.0)

## 2016-03-15 LAB — IRON AND TIBC
Iron: 58 ug/dL (ref 45–182)
SATURATION RATIOS: 29 % (ref 17.9–39.5)
TIBC: 197 ug/dL — ABNORMAL LOW (ref 250–450)
UIBC: 139 ug/dL

## 2016-03-15 LAB — FERRITIN: Ferritin: 402 ng/mL — ABNORMAL HIGH (ref 24–336)

## 2016-03-15 MED ORDER — EPOETIN ALFA 10000 UNIT/ML IJ SOLN
INTRAMUSCULAR | Status: AC
  Administered 2016-03-15: 10000 [IU] via SUBCUTANEOUS
  Filled 2016-03-15: qty 1

## 2016-03-15 MED ORDER — EPOETIN ALFA 20000 UNIT/ML IJ SOLN
INTRAMUSCULAR | Status: AC
  Administered 2016-03-15: 20000 [IU] via SUBCUTANEOUS
  Filled 2016-03-15: qty 1

## 2016-03-15 MED ORDER — EPOETIN ALFA 40000 UNIT/ML IJ SOLN: 30000.0000 [IU] | INTRAMUSCULAR | Status: DC

## 2016-03-21 ENCOUNTER — Other Ambulatory Visit (HOSPITAL_COMMUNITY): Payer: Self-pay | Admitting: *Deleted

## 2016-03-22 ENCOUNTER — Encounter (HOSPITAL_COMMUNITY)
Admission: RE | Admit: 2016-03-22 | Discharge: 2016-03-22 | Disposition: A | Discharge status: Home / Self Care | Payer: Medicare Other | Source: Ambulatory Visit | Attending: Nephrology | Admitting: Nephrology

## 2016-03-22 DIAGNOSIS — Z79899 Other long term (current) drug therapy: Secondary | ICD-10-CM | POA: Diagnosis not present

## 2016-03-22 DIAGNOSIS — N183 Chronic kidney disease, stage 3 (moderate): Secondary | ICD-10-CM | POA: Insufficient documentation

## 2016-03-22 DIAGNOSIS — D509 Iron deficiency anemia, unspecified: Secondary | ICD-10-CM | POA: Diagnosis not present

## 2016-03-22 DIAGNOSIS — D631 Anemia in chronic kidney disease: Secondary | ICD-10-CM | POA: Insufficient documentation

## 2016-03-22 DIAGNOSIS — Z5181 Encounter for therapeutic drug level monitoring: Secondary | ICD-10-CM | POA: Insufficient documentation

## 2016-03-22 DIAGNOSIS — N184 Chronic kidney disease, stage 4 (severe): Secondary | ICD-10-CM

## 2016-03-22 LAB — POCT HEMOGLOBIN-HEMACUE: Hemoglobin: 10.4 g/dL — ABNORMAL LOW (ref 13.0–17.0)

## 2016-03-22 MED ORDER — EPOETIN ALFA 20000 UNIT/ML IJ SOLN
INTRAMUSCULAR | Status: AC
  Administered 2016-03-22: 20000 [IU]
  Filled 2016-03-22: qty 1

## 2016-03-22 MED ORDER — SODIUM CHLORIDE 0.9 % IV SOLN
510.0000 mg | Freq: Once | INTRAVENOUS | Status: AC
  Administered 2016-03-22: 510 mg via INTRAVENOUS
  Filled 2016-03-22: qty 17

## 2016-03-22 MED ORDER — EPOETIN ALFA 40000 UNIT/ML IJ SOLN: 30000.0000 [IU] | INTRAMUSCULAR | Status: DC

## 2016-03-22 MED ORDER — EPOETIN ALFA 10000 UNIT/ML IJ SOLN
INTRAMUSCULAR | Status: AC
  Administered 2016-03-22: 10000 [IU]
  Filled 2016-03-22: qty 1

## 2016-03-29 ENCOUNTER — Encounter (HOSPITAL_COMMUNITY)
Admission: RE | Admit: 2016-03-29 | Discharge: 2016-03-29 | Disposition: A | Discharge status: Home / Self Care | Payer: Medicare Other | Source: Ambulatory Visit | Attending: Nephrology | Admitting: Nephrology

## 2016-03-29 DIAGNOSIS — N184 Chronic kidney disease, stage 4 (severe): Secondary | ICD-10-CM

## 2016-03-29 DIAGNOSIS — N183 Chronic kidney disease, stage 3 (moderate): Secondary | ICD-10-CM | POA: Diagnosis not present

## 2016-03-29 LAB — POCT HEMOGLOBIN-HEMACUE: HEMOGLOBIN: 11.5 g/dL — AB (ref 13.0–17.0)

## 2016-03-29 MED ORDER — EPOETIN ALFA 10000 UNIT/ML IJ SOLN
INTRAMUSCULAR | Status: AC
  Administered 2016-03-29: 10000 [IU] via SUBCUTANEOUS
  Filled 2016-03-29: qty 1

## 2016-03-29 MED ORDER — EPOETIN ALFA 20000 UNIT/ML IJ SOLN
INTRAMUSCULAR | Status: AC
  Administered 2016-03-29: 20000 [IU] via SUBCUTANEOUS
  Filled 2016-03-29: qty 1

## 2016-03-29 MED ORDER — EPOETIN ALFA 40000 UNIT/ML IJ SOLN: 30000.0000 [IU] | INTRAMUSCULAR | Status: DC

## 2016-04-04 ENCOUNTER — Other Ambulatory Visit: Payer: Self-pay

## 2016-04-04 MED ORDER — RANITIDINE HCL 300 MG PO CAPS: 300.0000 mg | ORAL_CAPSULE | Freq: Two times a day (BID) | ORAL | 11 refills | Status: DC

## 2016-04-04 MED ORDER — RANITIDINE HCL 300 MG PO CAPS: 300.0000 mg | ORAL_CAPSULE | ORAL | 1 refills | Status: DC

## 2016-04-05 ENCOUNTER — Encounter (HOSPITAL_COMMUNITY)
Admission: RE | Admit: 2016-04-05 | Discharge: 2016-04-05 | Disposition: A | Discharge status: Home / Self Care | Payer: Medicare Other | Source: Ambulatory Visit | Attending: Nephrology | Admitting: Nephrology

## 2016-04-05 DIAGNOSIS — N184 Chronic kidney disease, stage 4 (severe): Secondary | ICD-10-CM

## 2016-04-05 DIAGNOSIS — N183 Chronic kidney disease, stage 3 (moderate): Secondary | ICD-10-CM | POA: Diagnosis not present

## 2016-04-05 LAB — POCT HEMOGLOBIN-HEMACUE: HEMOGLOBIN: 10.9 g/dL — AB (ref 13.0–17.0)

## 2016-04-05 MED ORDER — EPOETIN ALFA 10000 UNIT/ML IJ SOLN
INTRAMUSCULAR | Status: AC
  Administered 2016-04-05: 10000 [IU]
  Filled 2016-04-05: qty 1

## 2016-04-05 MED ORDER — EPOETIN ALFA 20000 UNIT/ML IJ SOLN
INTRAMUSCULAR | Status: AC
  Administered 2016-04-05: 20000 [IU]
  Filled 2016-04-05: qty 1

## 2016-04-05 MED ORDER — EPOETIN ALFA 40000 UNIT/ML IJ SOLN: 30000.0000 [IU] | INTRAMUSCULAR | Status: DC

## 2016-04-12 ENCOUNTER — Encounter (HOSPITAL_COMMUNITY)
Admission: RE | Admit: 2016-04-12 | Discharge: 2016-04-12 | Disposition: A | Discharge status: Home / Self Care | Payer: Medicare Other | Source: Ambulatory Visit | Attending: Nephrology | Admitting: Nephrology

## 2016-04-12 DIAGNOSIS — Z79899 Other long term (current) drug therapy: Secondary | ICD-10-CM | POA: Diagnosis not present

## 2016-04-12 DIAGNOSIS — D631 Anemia in chronic kidney disease: Secondary | ICD-10-CM | POA: Diagnosis not present

## 2016-04-12 DIAGNOSIS — N184 Chronic kidney disease, stage 4 (severe): Secondary | ICD-10-CM

## 2016-04-12 DIAGNOSIS — N183 Chronic kidney disease, stage 3 (moderate): Secondary | ICD-10-CM | POA: Diagnosis not present

## 2016-04-12 DIAGNOSIS — Z5181 Encounter for therapeutic drug level monitoring: Secondary | ICD-10-CM | POA: Diagnosis not present

## 2016-04-12 LAB — POCT HEMOGLOBIN-HEMACUE: Hemoglobin: 11.8 g/dL — ABNORMAL LOW (ref 13.0–17.0)

## 2016-04-12 MED ORDER — EPOETIN ALFA 20000 UNIT/ML IJ SOLN
INTRAMUSCULAR | Status: AC
  Administered 2016-04-12: 20000 [IU]
  Filled 2016-04-12: qty 1

## 2016-04-12 MED ORDER — EPOETIN ALFA 10000 UNIT/ML IJ SOLN
INTRAMUSCULAR | Status: AC
  Administered 2016-04-12: 10000 [IU]
  Filled 2016-04-12: qty 1

## 2016-04-17 ENCOUNTER — Other Ambulatory Visit: Payer: Self-pay | Admitting: Internal Medicine

## 2016-04-18 NOTE — Telephone Encounter (Signed)
REFILL 

## 2016-04-19 ENCOUNTER — Encounter (HOSPITAL_COMMUNITY)
Admission: RE | Admit: 2016-04-19 | Discharge: 2016-04-19 | Disposition: A | Discharge status: Home / Self Care | Payer: Medicare Other | Source: Ambulatory Visit | Attending: Nephrology | Admitting: Nephrology

## 2016-04-19 DIAGNOSIS — N183 Chronic kidney disease, stage 3 (moderate): Secondary | ICD-10-CM | POA: Diagnosis not present

## 2016-04-19 DIAGNOSIS — N184 Chronic kidney disease, stage 4 (severe): Secondary | ICD-10-CM

## 2016-04-19 LAB — POCT HEMOGLOBIN-HEMACUE: HEMOGLOBIN: 11.8 g/dL — AB (ref 13.0–17.0)

## 2016-04-19 LAB — IRON AND TIBC
Iron: 82 ug/dL (ref 45–182)
SATURATION RATIOS: 46 % — AB (ref 17.9–39.5)
TIBC: 178 ug/dL — ABNORMAL LOW (ref 250–450)
UIBC: 96 ug/dL

## 2016-04-19 LAB — FERRITIN: FERRITIN: 444 ng/mL — AB (ref 24–336)

## 2016-04-19 MED ORDER — EPOETIN ALFA 40000 UNIT/ML IJ SOLN: 30000.0000 [IU] | INTRAMUSCULAR | Status: DC

## 2016-04-19 MED ORDER — EPOETIN ALFA 10000 UNIT/ML IJ SOLN
INTRAMUSCULAR | Status: AC
  Administered 2016-04-19: 10000 [IU] via SUBCUTANEOUS
  Filled 2016-04-19: qty 1

## 2016-04-19 MED ORDER — EPOETIN ALFA 20000 UNIT/ML IJ SOLN
INTRAMUSCULAR | Status: AC
  Administered 2016-04-19: 20000 [IU] via SUBCUTANEOUS
  Filled 2016-04-19: qty 1

## 2016-04-21 ENCOUNTER — Ambulatory Visit (INDEPENDENT_AMBULATORY_CARE_PROVIDER_SITE_OTHER): Payer: Medicare Other | Admitting: Family Medicine

## 2016-04-21 DIAGNOSIS — Z23 Encounter for immunization: Secondary | ICD-10-CM

## 2016-04-26 ENCOUNTER — Encounter (HOSPITAL_COMMUNITY): Payer: Medicare Other

## 2016-04-26 ENCOUNTER — Other Ambulatory Visit: Payer: Self-pay | Admitting: Internal Medicine

## 2016-04-26 NOTE — Telephone Encounter (Signed)
Rx request sent to pharmacy.  

## 2016-04-27 ENCOUNTER — Encounter (HOSPITAL_COMMUNITY)
Admission: RE | Admit: 2016-04-27 | Discharge: 2016-04-27 | Disposition: A | Discharge status: Home / Self Care | Payer: Medicare Other | Source: Ambulatory Visit | Attending: Nephrology | Admitting: Nephrology

## 2016-04-27 DIAGNOSIS — N184 Chronic kidney disease, stage 4 (severe): Secondary | ICD-10-CM

## 2016-04-27 DIAGNOSIS — N183 Chronic kidney disease, stage 3 (moderate): Secondary | ICD-10-CM | POA: Diagnosis not present

## 2016-04-27 LAB — POCT HEMOGLOBIN-HEMACUE: HEMOGLOBIN: 11.9 g/dL — AB (ref 13.0–17.0)

## 2016-04-27 MED ORDER — EPOETIN ALFA 10000 UNIT/ML IJ SOLN
INTRAMUSCULAR | Status: AC
  Administered 2016-04-27: 10000 [IU] via SUBCUTANEOUS
  Filled 2016-04-27: qty 1

## 2016-04-27 MED ORDER — EPOETIN ALFA 40000 UNIT/ML IJ SOLN: 30000.0000 [IU] | INTRAMUSCULAR | Status: DC

## 2016-04-27 MED ORDER — EPOETIN ALFA 20000 UNIT/ML IJ SOLN
INTRAMUSCULAR | Status: AC
  Administered 2016-04-27: 20000 [IU] via SUBCUTANEOUS
  Filled 2016-04-27: qty 1

## 2016-05-03 ENCOUNTER — Encounter (HOSPITAL_COMMUNITY)
Admission: RE | Admit: 2016-05-03 | Discharge: 2016-05-03 | Disposition: A | Discharge status: Home / Self Care | Payer: Medicare Other | Source: Ambulatory Visit | Attending: Nephrology | Admitting: Nephrology

## 2016-05-03 DIAGNOSIS — N183 Chronic kidney disease, stage 3 (moderate): Secondary | ICD-10-CM | POA: Diagnosis not present

## 2016-05-03 DIAGNOSIS — N184 Chronic kidney disease, stage 4 (severe): Secondary | ICD-10-CM

## 2016-05-03 LAB — POCT HEMOGLOBIN-HEMACUE: HEMOGLOBIN: 11.6 g/dL — AB (ref 13.0–17.0)

## 2016-05-03 MED ORDER — EPOETIN ALFA 40000 UNIT/ML IJ SOLN: 30000.0000 [IU] | INTRAMUSCULAR | Status: DC

## 2016-05-03 MED ORDER — EPOETIN ALFA 10000 UNIT/ML IJ SOLN
INTRAMUSCULAR | Status: AC
  Administered 2016-05-03: 10000 [IU] via SUBCUTANEOUS
  Filled 2016-05-03: qty 1

## 2016-05-03 MED ORDER — EPOETIN ALFA 20000 UNIT/ML IJ SOLN
INTRAMUSCULAR | Status: AC
Start: 2016-05-03 — End: 2016-05-03
  Administered 2016-05-03: 20000 [IU] via SUBCUTANEOUS
  Filled 2016-05-03: qty 1

## 2016-05-10 ENCOUNTER — Encounter (HOSPITAL_COMMUNITY)
Admission: RE | Admit: 2016-05-10 | Discharge: 2016-05-10 | Disposition: A | Discharge status: Home / Self Care | Payer: Medicare Other | Source: Ambulatory Visit | Attending: Nephrology | Admitting: Nephrology

## 2016-05-10 DIAGNOSIS — Z79899 Other long term (current) drug therapy: Secondary | ICD-10-CM | POA: Diagnosis not present

## 2016-05-10 DIAGNOSIS — N184 Chronic kidney disease, stage 4 (severe): Secondary | ICD-10-CM

## 2016-05-10 DIAGNOSIS — N183 Chronic kidney disease, stage 3 (moderate): Secondary | ICD-10-CM | POA: Insufficient documentation

## 2016-05-10 DIAGNOSIS — Z5181 Encounter for therapeutic drug level monitoring: Secondary | ICD-10-CM | POA: Insufficient documentation

## 2016-05-10 DIAGNOSIS — D631 Anemia in chronic kidney disease: Secondary | ICD-10-CM | POA: Diagnosis not present

## 2016-05-10 LAB — POCT HEMOGLOBIN-HEMACUE: Hemoglobin: 10.3 g/dL — ABNORMAL LOW (ref 13.0–17.0)

## 2016-05-10 MED ORDER — EPOETIN ALFA 10000 UNIT/ML IJ SOLN
INTRAMUSCULAR | Status: AC
  Administered 2016-05-10: 10000 [IU] via SUBCUTANEOUS
  Filled 2016-05-10: qty 1

## 2016-05-10 MED ORDER — EPOETIN ALFA 40000 UNIT/ML IJ SOLN: 30000.0000 [IU] | INTRAMUSCULAR | Status: DC

## 2016-05-10 MED ORDER — EPOETIN ALFA 20000 UNIT/ML IJ SOLN
INTRAMUSCULAR | Status: AC
  Administered 2016-05-10: 20000 [IU] via SUBCUTANEOUS
  Filled 2016-05-10: qty 1

## 2016-05-17 ENCOUNTER — Other Ambulatory Visit: Payer: Self-pay

## 2016-05-17 ENCOUNTER — Other Ambulatory Visit: Payer: Self-pay | Admitting: Internal Medicine

## 2016-05-17 ENCOUNTER — Encounter (HOSPITAL_COMMUNITY)
Admission: RE | Admit: 2016-05-17 | Discharge: 2016-05-17 | Disposition: A | Discharge status: Home / Self Care | Payer: Medicare Other | Source: Ambulatory Visit | Attending: Nephrology | Admitting: Nephrology

## 2016-05-17 DIAGNOSIS — N184 Chronic kidney disease, stage 4 (severe): Secondary | ICD-10-CM

## 2016-05-17 DIAGNOSIS — N183 Chronic kidney disease, stage 3 (moderate): Secondary | ICD-10-CM | POA: Diagnosis not present

## 2016-05-17 LAB — IRON AND TIBC
Iron: 70 ug/dL (ref 45–182)
SATURATION RATIOS: 37 % (ref 17.9–39.5)
TIBC: 190 ug/dL — ABNORMAL LOW (ref 250–450)
UIBC: 120 ug/dL

## 2016-05-17 LAB — FERRITIN: FERRITIN: 497 ng/mL — AB (ref 24–336)

## 2016-05-17 LAB — POCT HEMOGLOBIN-HEMACUE: HEMOGLOBIN: 10.9 g/dL — AB (ref 13.0–17.0)

## 2016-05-17 MED ORDER — EPOETIN ALFA 10000 UNIT/ML IJ SOLN
INTRAMUSCULAR | Status: AC
  Administered 2016-05-17: 10000 [IU] via SUBCUTANEOUS
  Filled 2016-05-17: qty 1

## 2016-05-17 MED ORDER — EPOETIN ALFA 20000 UNIT/ML IJ SOLN
INTRAMUSCULAR | Status: AC
  Administered 2016-05-17: 20000 [IU] via SUBCUTANEOUS
  Filled 2016-05-17: qty 1

## 2016-05-17 MED ORDER — SIMVASTATIN 20 MG PO TABS: ORAL_TABLET | ORAL | 1 refills | Status: DC

## 2016-05-17 MED ORDER — EPOETIN ALFA 40000 UNIT/ML IJ SOLN: 30000.0000 [IU] | INTRAMUSCULAR | Status: DC

## 2016-05-24 ENCOUNTER — Encounter (HOSPITAL_COMMUNITY)
Admission: RE | Admit: 2016-05-24 | Discharge: 2016-05-24 | Disposition: A | Discharge status: Home / Self Care | Payer: Medicare Other | Source: Ambulatory Visit | Attending: Nephrology | Admitting: Nephrology

## 2016-05-24 ENCOUNTER — Other Ambulatory Visit: Payer: Self-pay | Admitting: Family Medicine

## 2016-05-24 DIAGNOSIS — N184 Chronic kidney disease, stage 4 (severe): Secondary | ICD-10-CM

## 2016-05-24 DIAGNOSIS — N183 Chronic kidney disease, stage 3 (moderate): Secondary | ICD-10-CM | POA: Diagnosis not present

## 2016-05-24 LAB — POCT HEMOGLOBIN-HEMACUE: Hemoglobin: 10.7 g/dL — ABNORMAL LOW (ref 13.0–17.0)

## 2016-05-24 IMAGING — DX DG CHEST 2V
2 series · 2 of 2 positions shown · non-contrast
Comparison: PA and lateral chest x-ray May 19, 2015 and CT
scan of the chest of May 28, 2015

CLINICAL DATA: Routine examination, no current complaints, history
of asthma, bronchiectasis, previous fungal pulmonary infections,
coronary artery disease.

EXAM:
CHEST  2 VIEW

[chest pa]
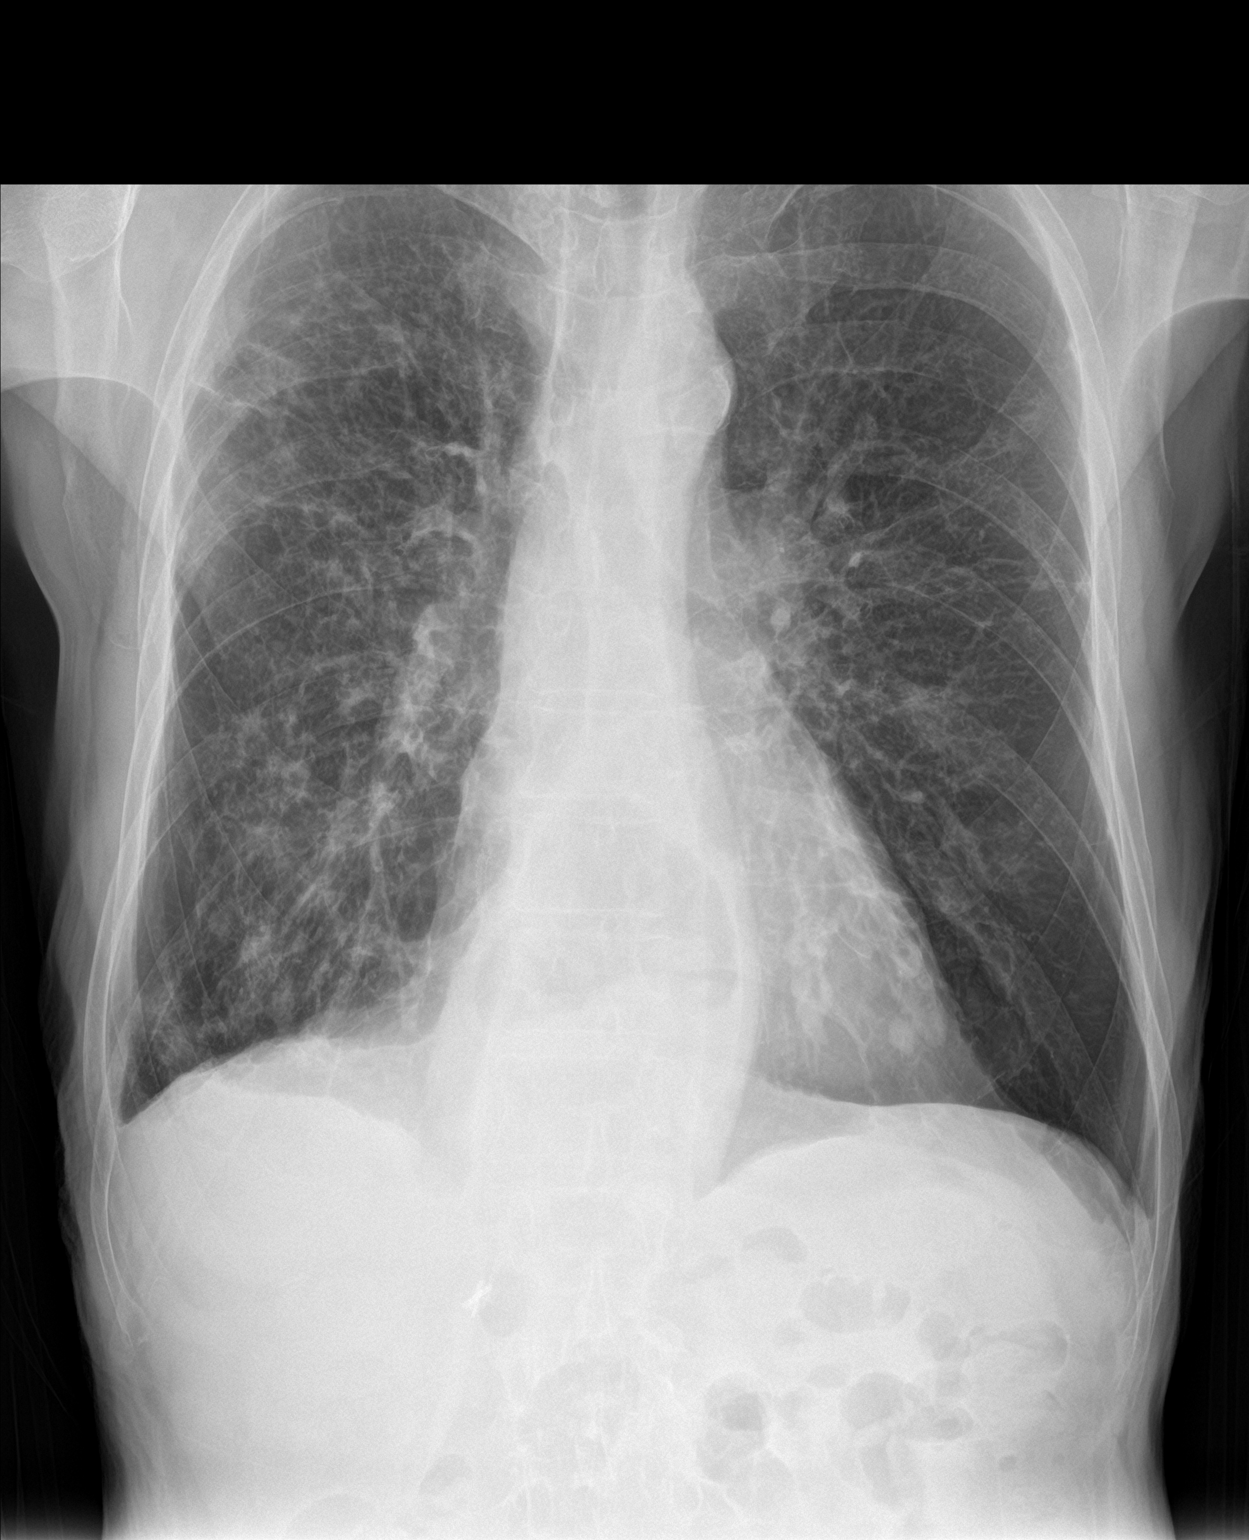

[chest lat]
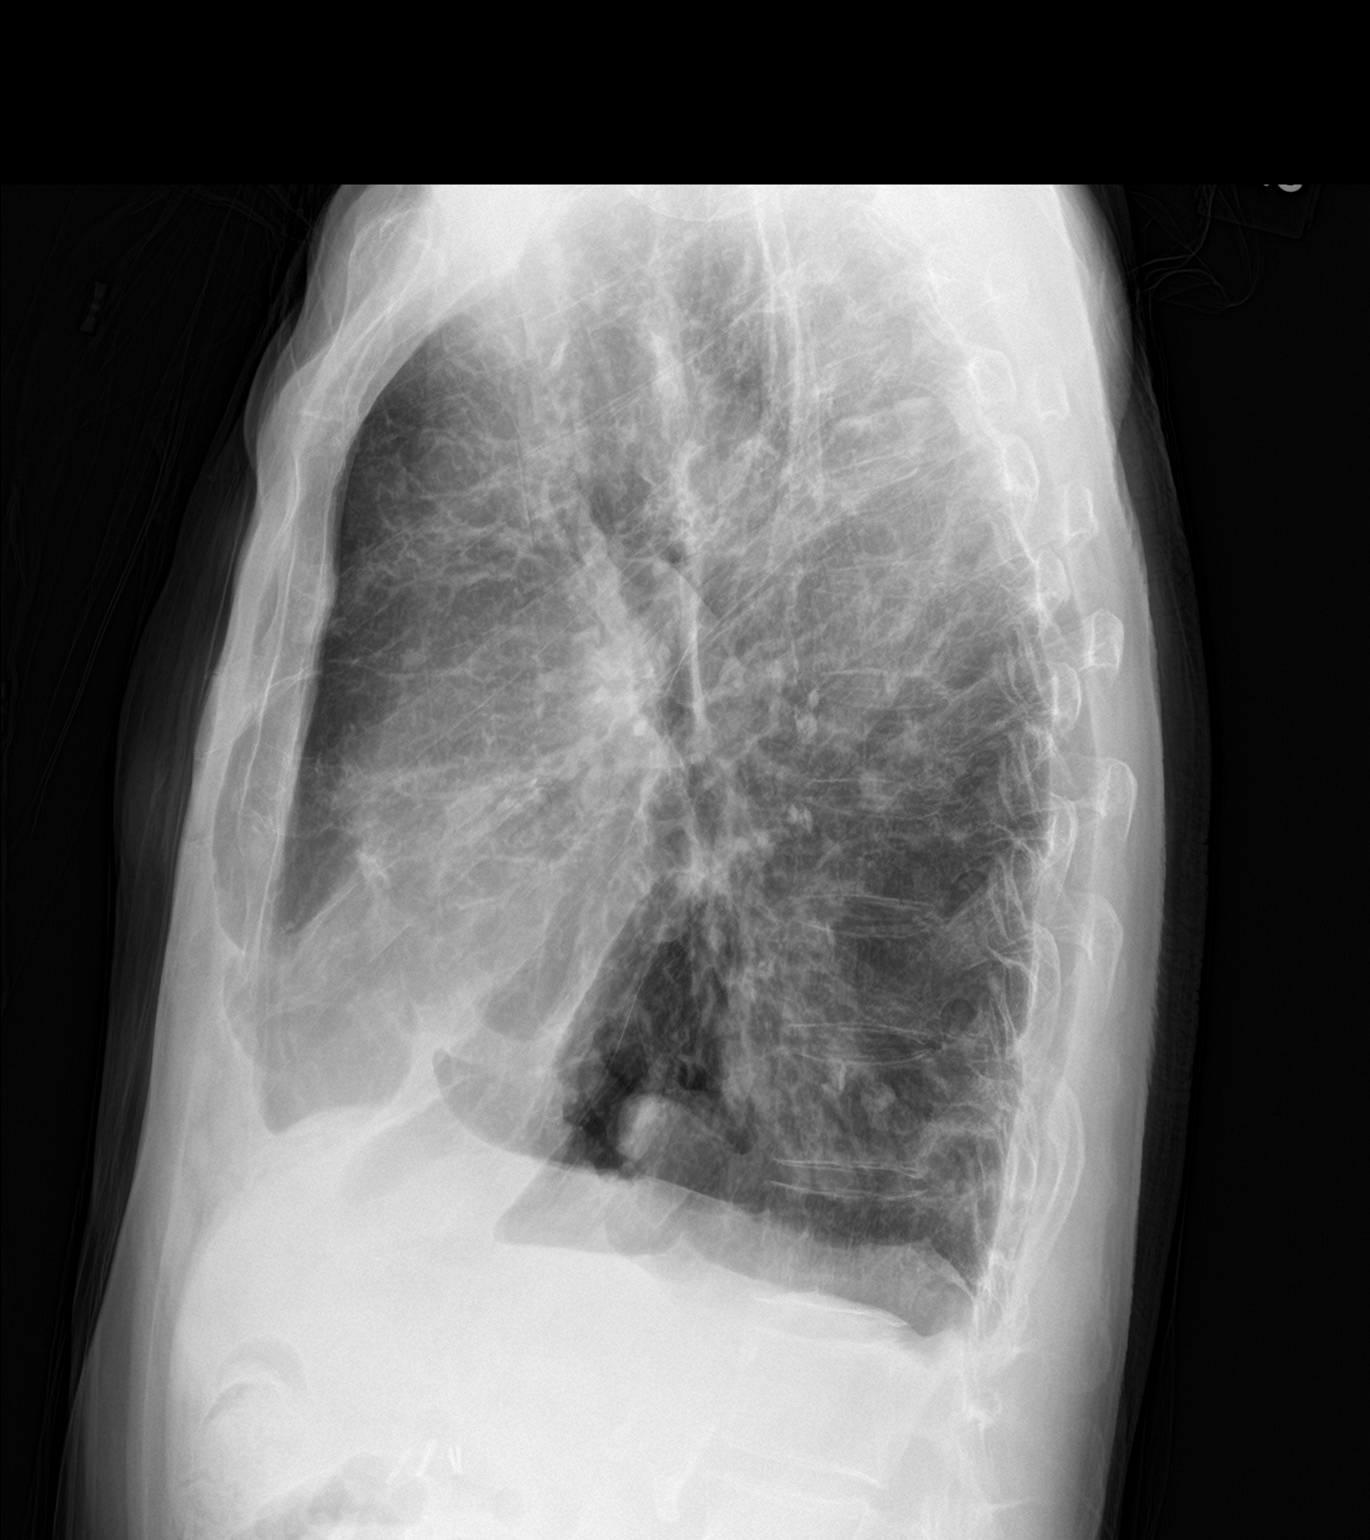

[2 of 2 positions shown; findings below may reference images not displayed]

FINDINGS: The lungs remain hyperinflated. The interstitial markings remain
coarse with patchy areas of confluence bilaterally. These are
slightly less conspicuous overall today. There is no pleural
effusion. There is a large hiatal hernia with an air-fluid level.
The heart and pulmonary vascularity are normal. The bony thorax
exhibits no acute abnormality.
IMPRESSION: Extensive chronic fibrotic change consistent with the patient's
history of reactive airway disease, bronchiectasis, and chronic
fungal infection. Overall the parenchymal densities are slightly
less conspicuous today. Moderate-sized hiatal hernia.

## 2016-05-24 MED ORDER — EPOETIN ALFA 10000 UNIT/ML IJ SOLN
INTRAMUSCULAR | Status: AC
  Administered 2016-05-24: 10000 [IU] via SUBCUTANEOUS
  Filled 2016-05-24: qty 1

## 2016-05-24 MED ORDER — EPOETIN ALFA 40000 UNIT/ML IJ SOLN: 30000.0000 [IU] | INTRAMUSCULAR | Status: DC

## 2016-05-24 MED ORDER — EPOETIN ALFA 20000 UNIT/ML IJ SOLN
INTRAMUSCULAR | Status: AC
  Administered 2016-05-24: 20000 [IU] via SUBCUTANEOUS
  Filled 2016-05-24: qty 1

## 2016-05-29 ENCOUNTER — Other Ambulatory Visit: Payer: Self-pay | Admitting: Internal Medicine

## 2016-06-01 ENCOUNTER — Other Ambulatory Visit: Payer: Self-pay | Admitting: Internal Medicine

## 2016-06-01 ENCOUNTER — Encounter (HOSPITAL_COMMUNITY)
Admission: RE | Admit: 2016-06-01 | Discharge: 2016-06-01 | Disposition: A | Discharge status: Home / Self Care | Payer: Medicare Other | Source: Ambulatory Visit | Attending: Nephrology | Admitting: Nephrology

## 2016-06-01 DIAGNOSIS — N184 Chronic kidney disease, stage 4 (severe): Secondary | ICD-10-CM

## 2016-06-01 DIAGNOSIS — N183 Chronic kidney disease, stage 3 (moderate): Secondary | ICD-10-CM | POA: Diagnosis not present

## 2016-06-01 LAB — POCT HEMOGLOBIN-HEMACUE: Hemoglobin: 10.2 g/dL — ABNORMAL LOW (ref 13.0–17.0)

## 2016-06-01 MED ORDER — EPOETIN ALFA 40000 UNIT/ML IJ SOLN: 30000.0000 [IU] | INTRAMUSCULAR | Status: DC

## 2016-06-01 MED ORDER — EPOETIN ALFA 10000 UNIT/ML IJ SOLN
INTRAMUSCULAR | Status: AC
  Administered 2016-06-01: 10000 [IU] via SUBCUTANEOUS
  Filled 2016-06-01: qty 1

## 2016-06-01 MED ORDER — EPOETIN ALFA 20000 UNIT/ML IJ SOLN
INTRAMUSCULAR | Status: AC
  Administered 2016-06-01: 20000 [IU] via SUBCUTANEOUS
  Filled 2016-06-01: qty 1

## 2016-06-08 ENCOUNTER — Encounter (HOSPITAL_COMMUNITY)
Admission: RE | Admit: 2016-06-08 | Discharge: 2016-06-08 | Disposition: A | Discharge status: Home / Self Care | Payer: Medicare Other | Source: Ambulatory Visit | Attending: Nephrology | Admitting: Nephrology

## 2016-06-08 DIAGNOSIS — N183 Chronic kidney disease, stage 3 (moderate): Secondary | ICD-10-CM | POA: Diagnosis not present

## 2016-06-08 DIAGNOSIS — Z5181 Encounter for therapeutic drug level monitoring: Secondary | ICD-10-CM | POA: Diagnosis not present

## 2016-06-08 DIAGNOSIS — N184 Chronic kidney disease, stage 4 (severe): Secondary | ICD-10-CM

## 2016-06-08 DIAGNOSIS — Z79899 Other long term (current) drug therapy: Secondary | ICD-10-CM | POA: Diagnosis not present

## 2016-06-08 DIAGNOSIS — D631 Anemia in chronic kidney disease: Secondary | ICD-10-CM | POA: Diagnosis not present

## 2016-06-08 LAB — POCT HEMOGLOBIN-HEMACUE: Hemoglobin: 9.8 g/dL — ABNORMAL LOW (ref 13.0–17.0)

## 2016-06-08 MED ORDER — EPOETIN ALFA 20000 UNIT/ML IJ SOLN
INTRAMUSCULAR | Status: AC
  Administered 2016-06-08: 20000 [IU]
  Filled 2016-06-08: qty 1

## 2016-06-08 MED ORDER — EPOETIN ALFA 10000 UNIT/ML IJ SOLN
INTRAMUSCULAR | Status: AC
  Administered 2016-06-08: 10000 [IU]
  Filled 2016-06-08: qty 1

## 2016-06-08 MED ORDER — EPOETIN ALFA 40000 UNIT/ML IJ SOLN: 30000.0000 [IU] | INTRAMUSCULAR | Status: DC

## 2016-06-09 ENCOUNTER — Ambulatory Visit (INDEPENDENT_AMBULATORY_CARE_PROVIDER_SITE_OTHER): Payer: Medicare Other | Admitting: Internal Medicine

## 2016-06-09 ENCOUNTER — Ambulatory Visit (INDEPENDENT_AMBULATORY_CARE_PROVIDER_SITE_OTHER)
Admission: RE | Admit: 2016-06-09 | Discharge: 2016-06-09 | Disposition: A | Discharge status: Home / Self Care | Payer: Medicare Other | Source: Ambulatory Visit | Attending: Internal Medicine | Admitting: Internal Medicine

## 2016-06-09 ENCOUNTER — Encounter: Payer: Self-pay | Admitting: Internal Medicine

## 2016-06-09 VITALS — BP 94/60 | HR 74 | Ht 69.0 in | Wt 124.4 lb

## 2016-06-09 DIAGNOSIS — J471 Bronchiectasis with (acute) exacerbation: Secondary | ICD-10-CM

## 2016-06-09 DIAGNOSIS — J479 Bronchiectasis, uncomplicated: Secondary | ICD-10-CM

## 2016-06-09 DIAGNOSIS — M3131 Wegener's granulomatosis with renal involvement: Secondary | ICD-10-CM | POA: Diagnosis not present

## 2016-06-09 MED ORDER — AMOXICILLIN-POT CLAVULANATE 875-125 MG PO TABS: 1.0000 | ORAL_TABLET | Freq: Two times a day (BID) | ORAL | 11 refills | Status: AC

## 2016-06-09 NOTE — Patient Instructions (Addendum)
At first sign of any bleeding stop aspirin and if persists or severe stop plavix (clopidogrel), too   For cough take up to 1200 mg every 12 hours of mucinex dm and use the flutter valve as much as possible   For nasty mucus > Augmentin 875 mg take one pill twice daily  X 10 days - take at breakfast and supper with large glass of water.  It would help reduce the usual side effects (diarrhea and yeast infections) if you ate cultured yogurt at lunch.   See calendar for specific medication instructions and bring it back for each and every office visit for every healthcare provider you see.  Without it,  you may not receive the best quality medical care that we feel you deserve.  You will note that the calendar groups together  your maintenance  medications that are timed at particular times of the day.  Think of this as your checklist for what your doctor has instructed you to do until your next evaluation to see what benefit  there is  to staying on a consistent group of medications intended to keep you well.  The other group at the bottom is entirely up to you to use as you see fit  for specific symptoms that may arise between visits that require you to treat them on an as needed basis.  Think of this as your action plan or "what if" list.   Separating the top medications from the bottom group is fundamental to providing you adequate care going forward.   See Tammy NP in 4 weeks with all your medications, even over the counter meds, separated in two separate bags, the ones you take no matter what vs the ones you stop once you feel better and take only as needed   And she will write you a new med calendar

## 2016-06-09 NOTE — Assessment & Plan Note (Signed)
-     PFT's 06/21/11   FEV1  1.60 (61%) ratio 48 and 10% better p B2,  DLCO 83%  - CT 10/04/13 severe multifocal bronchiectasis as well as numerous cavitary and      non-cavitary nodules  -   PFT's 11/22/2012 FEV1  1.52 (62%) arnd 46 and no change p B2 DLCO 89%  -    VEST rec 02/12/2014 > reported improvement 05/23/14  - 07/08/2015  extensive coaching HFA effectiveness =  90%    - 02/01/2016  After extensive coaching HFA effectiveness =    90%  - PFT's  02/01/2016  FEV1 1.34 (51 % ) ratio 53  p 4 % improvement from saba p symbicort 160 prior to study with DLCO  44/46 % corrects to 85 % for alv volume      - changed to prn augmentin x 10 days 06/09/2016 >>>   Continues to struggle with action plan/ checklist approach to med reconciliation that he has been provided  I had an extended discussion with the patient reviewing all relevant studies completed to date and  lasting 15 to 20 minutes of a 25 minute visit    Each maintenance medication was reviewed in detail including most importantly the difference between maintenance and prns and under what circumstances the prns are to be triggered using an action plan format that is not reflected in the computer generated alphabetically organized AVS but trather by a customized med calendar that reflects the AVS meds with confirmed 100% correlation.   Please see instructions for details which were reviewed in writing and the patient given a copy highlighting the part that I personally wrote and discussed at today's ov.

## 2016-06-09 NOTE — Assessment & Plan Note (Signed)
F/u by renal, occ hemoptysis on plavix / asa with elimination of bleeding when stops asa but also advised stop plavix if bleeding worsens or does not rapidly clear off asa

## 2016-06-09 NOTE — Progress Notes (Signed)
Subjective:    Patient ID: Richard [name], male    DOB: [birth year]     MRN: [id number]   Brief patient profile:  78  yowm never smoker with dx of Longstanding bronchiectasis then dx with WG 03/2010 > requested establish with Rion Catala.   Admit [facility] dx WG by pos c anca 03/2010, supportive tbbx but not vats   Admit [name] dx spont L Ptx 9/08/15/09 requiring Chest tube but no surgery   11/23/2012 f/u ov/Tawanna Funk re WG/ bronchiectasis Chief Complaint  Patient presents with  . Follow-up    Had PFT today-sob same,cough-yellow,wheezing occass.,  no hemoptysis, no real limiting sob though sedentary, no arthralgias or rash maintaining 2.5 mg daily No change prednisone 5 mg one half daily  > ok to try every other day if doing great May 1 flare rx levaquin and pred May 16 lap chole hoxworth   08/19/2013 f/u ov/Lemoyne Nestor re: ? Recurrent WG Chief Complaint  Patient presents with  . Follow-up    Breathing is unchanged. Cough unchanged-prod cpugh w/ yellow phlem. On 2nd dose of cipro  now on day 5/10 cipro and less dark, never bloody, no sinus complaints, arthritis rash or fever or hematuria. >ANCA 1:80 titer, started on cytoxan          Patient ID: Richard [name]  MRN: [id number], DOB/AGE: [birth year] 80 y.o.  Admit date: 03/13/2014  D/C date: 03/26/2014   Primary Cardiologist: Dr. Debara Pickett  Principal Problem:  Unstable Class III Angina  Active Problems:  HYPOTHYROIDISM  HYPERLIPIDEMIA  ANEMIA  Wegener's granulomatosis  Chronic respiratory failure  Abnormal nuclear stress test: Intermediate risk with moderate region of apical and inferior scar with mild superimposed ischemia; EF 40%  CAD- severe 4 V CAD- not CABG candidate  Anemia- transfused 03/16/14  Chronic renal insufficiency, stage III (moderate)  Cardiomyopathy, ischemic- EF 40% by Myoview  Acute blood loss anemia  Femoral artery pseudo-aneurysm, right  Admission Dates: 03/13/14 - 03/26/14  Discharge Diagnosis: [place] s/p [facility] x3 and ultimately BMS to  pLAD& POBA to CTO of circumflex/marginal vessel on 03/17/14 and staged BMS to mRCA on 03/21/14  HPI: Richard [name] is a 80 y.o. male with a history of hypothyroidism, HLD, anemia, Wegener's granulomatosis and no prior cardiac disease who presented to [facility] ED on 03/13/14 with chest pain and was transferred to [facility] for cath the following morning.  PMH significant for Wegener's granulomatosis and follows with Dr. [name] for his pulmonary disease (severe multifocal bronchiectasis as well as numerous cavitary and non-cavitary nodules). He had been having chest tightness with significant exertion for the last few months with increasing dyspnea on exertion. Also had 2 brothers who both had CABG. Referred to Dr. Debara Pickett who proceeded with nuclear stress testing which demonstrated a "moderate region of apical and inferior scar with mild superimposed qualitative ischemia". LV wall motion demonstrated a "moderate degree of hypokinesis involving the mid-distal inferior wall and apex with an EF of 40%". Prior echo dated 03/30/2010 showed EF 45-50%.  Hospital Course: He presented to [facility] ED with increasing fatigue and chest tightness which developed that morning, which resolved within 10 minutes.  He was given ASA, heparin, and nitro paste. ECG demonstrated sinus tachycardia with no acute ST-T abnormalities. POC troponin normal. Also noted to be anemic, Hgb 9 (10 on 12/06/13). Dr. [name] noted that Richard [name] has had hemoptysis in the past, therefore limiting DAPT was ideal and BMS over DES thought to be the best option.  [place]/CAD:  -- Abnormal outpatient  nuclear stress test on 03/12/14: Intermediate risk with moderate region of apical and inferior scar with mild superimposed ischemia; EF 40%  -- s/p first LHC on 03/14/14 which revealed severe multivessel CAD involving all 4 major branches. Seen by Dr. Madolyn Frieze on this admission who deemed him unsuitable for CABG due to underlying comorbid conditions. ( sts score >30). It was elected to  proceed with staged multivessel PCI .  -- s/p LHC on 03/17/14 with successful PCI to the LAD and POBA to a chronically occluded LCX. There remained high grade proximal disease to a large proximal Ramus branch as well as mid-RCA disease so staged PCI was planned at a later date due to pulmonary issues and renal dysfunction. This was further complicated by right femoral artery pseudoaneurysm requiring repair. LHC on 03/21/14 s/p PCI with rotablator atherectomy with BMS to the mid RCA. Due to length of fluoro time and large volume of contrast, it was decided to medically manage the ramus stenosis which Dr. Angelena Form felt was moderately stenosed and if he has recurrent anginal then consider PCI.  -- Continue ASA/BB/Plavix/statin  Right femoral pseudoaneurysm: s/p suture repair of femoral artery with evacuation of hematoma on 03/19/14. Still with significant hematoma down buttox and hamstring but improving. CBC stable at 7.7.  -- Has follow up with Dr. Kellie Simmering on 04/08/2014  Anemia: subsequent to right femoral pseudoaneurysm- s/p surgical repair. S/p blood transfusion x 2. Hgb improved from 7.4 to 8.6. Stable at 7.7 on discharge.  --Will send home on Iron 324 mg daily with colace 100 mg BID to help prevent constipation  SOB - BNP elevated at 4937. D-Dimer elevated at 2.6. CTA with no acute PE. No s/s of volume overload  -- Though to be possibly due to anemia. Chest xray showed multiple cavitary lesions but no CHF. CTA with multiple abnormalities possibly reflecting acute bronchitis vs organizing PNA. PCCM (followed by Dr. [name]) consulted who felt that the CT was at his baseline and to continue current therapy. He thought his SOB was likely multifactorial and anemia also playing a role.  -- BNP elevated at 4937. D-Dimer elevated at 2.6. CTA with no acute PE. No s/s of volume overload  Chronic systolic CHF- 2 D ECHO with LVEF 40-45%, mid to distal inferoapical and apical hypokinesis, diastolic dysfunction with elevated  LV filling pressure, mild to moderate RV hypokinesis.  Wegener's granulomatosis. ( per PCCM)  -- Continue cytoxan, prednisone  -- Bactrim for PCP prophylaxis  -- Will need pulmonary rehab as an outpatient  Chronic renal insufficiency, stage III (moderate).  -- Creat elevated but stable after contrast exposure for CTA  -- Creat stable at 1.48 today. Continue to monitor  The patient has had a prolonged hospital course but is recovering well. The femoral catheter site is stable. He has been seen by Dr. Irish Lack today and deemed ready for discharge home. All follow-up appointments have been scheduled. Discharge medications are listed below.         09/02/2014 Follow up : Bronchiectasis/NP Patient returns for a two-week follow-up. Last visit. Patient with a bronchiectatic exacerbation. He was treated with Augmentin 10 days and a prednisone burst. Patient has started to slowly feel improved with decreased discolored mucus. Shortness of breath is not quite as bad. He has made very slow progress. He denies any hemoptysis, orthopnea, PND, leg swelling or calf pain. Appetite is fair. No nausea, vomiting or diarrhea. Chest x-ray last visit showed chronic changes rec Continue on current regimen  Continue with VEST  therapy.     10/30/2014 f/u ov/Oluwatomiwa Kinyon re: bronchiectasis/AB Chief Complaint  Patient presents with  . Follow-up    Pt c/o increased SOB and chest congestion for the past 5 days. Cough is prod with yellow sputum.     rx with levaquin x 2 days but only used saba twice ? How much can you use it if you are having a bad day with your breathing A Not sure Baseline = sob x more than slow adls rec Increase prednisone to 15 mg per day until better then back to 7.5 mg daily  Augmentin 875 mg take one pill twice daily  X 10 days - take at breakfast and supper with large glass of water.  It would help reduce the usual side effects (diarrhea and yeast infections) if you ate cultured yogurt at  lunch.  For breathing/coughing/ congestion >  Up to 2 puff every 4 hours as needed  Make sure you are taking pepcid 20 mg after bfast and supper  See Tammy NP w/in 2 weeks with all your medications > did not do     07/08/2015  f/u ov/Jennel Mara re: bronchiectasis/ on pred 7.5 mg daily / 50 mg daily cytoxan per renal  Chief Complaint  Patient presents with  . Follow-up    Pt c/o continued dyspnea with exertion and wet cough with yellow mucus. Pt denies wheeze/CP/tightness.   not using flutter / not understanding contingencies prev listed on his home made med calendar but no longer using it anyway   maint on sym 160 2bid with baseline doe = MMRC1 = can walk nl pace, flat grade, can't hurry or go uphills or steps s sob  Not sure vest helping and not using as consistently   rec Levaquin 750 mg  Daily x 5-7 days when mucus gets nasty  Use the flutter valve just as much as you can   10/13/2015  f/u ov/Tanicia Wolaver re: WG/ bronchiectasis on 7 day cycles of levaquin/ not using flutter / is using vest up to an hour a day / pred 7.5/ctx 50  Chief Complaint  Patient presents with  . Follow-up    Breathing is unchanged. His cough is prod with moderate yellow sputum.   first thing in am is worst time for breathing and then takes symbicort 160 x 2 and better/ rare need for saba  Doe x MMRC1 = can walk nl pace, flat grade, can't hurry or go uphills or steps s sob   levaquin 750 x 7 days improves mucus and never bloody,  Helps for at least  2 weeks then usually  recurs  rec No change rx   01/07/16 exac seen in ER > rx pred 15/ augmentin   02/01/2016  f/u ov/Claudell Rhody re: obst bronchiectasis maint rx = symbicort 160 2 bid and duoneb bid Chief Complaint  Patient presents with  . Follow-up    PFT done today.  Pt went to ED in Bronchiectasis flare 01/07/16- was txed with Augmentin. He is using duoneb 2 x daily. His cough is prod with yellow sputum.  back to baseline doe = MMRC=1  but confused with meds again , esp  prns rec Plan A = Automatic = Symbicort 160 Take 2 puffs first thing in am and then another 2 puffs about 12 hours later.  Plan B = Backup Only use your albuterol (Red =Proair/ yellow = Proventil) as a rescue medication  Plan C = Crisis - only use your albuterol/ipatropium(duoneb) nebulizer if you first try  Plan B   02/29/2016 acute extended ov/Raushanah Osmundson re:  Hemoptysis /obst bronchiectasis on symb 160 2bid Chief Complaint  Patient presents with  . Acute Visit    Pt c/o hemoptysis since 7/22. Blood is bright red in color. He states he feels very fatigued today.   coughing more than usual x 02/27/16 pm a total of about one half cup of blood and turned yellow am of ov but has used none of his prev contingency /action plans and is due in next week for a formal med calendar that will provide this in writing  Doe no change baseline and no increase in saba use  >>hold asa , levaquin x 7   03/08/2016 NP Follow up : Bronchiectaisis  Patient returns for a one-week follow-up. Patient had a recent flare bronchiectasis. Last visit with hemoptysis. Was instructed to hold aspirin. Started on Levaquin for 1 week. Chest x-ray shows chronic changes of bronchiectasis. Labs showed stable hemoglobin. Sedimentation rate was normal at 8. Since last visit. Patient is feeling better. Hemoptysis has stopped.  We reviewed all his medications organize them into a medication count with patient education Appears to taking correctly.  We discussed using flutter valve more.  He could not use VEST in past.  rec Continue on current regimen  Follow med calendar closely and bring to each visit.  follow up Dr. [name]  In 3 months and As needed      06/09/2016  f/u ov/Kali Ambler re: obst bronchiectasis/ WG - has med calendar not using  maint rx symbicort 160 2bid /pred/ctx per renal Chief Complaint  Patient presents with  . Follow-up    cough with dark yellow sputum, finished levaquin about 5 days ago and feels that this did not  help.   levaquin 750 daily x 7 days  No longer helping cough/ purulent sputum  Confused with use of med calendar > using neb twice daily duoneb/ not hfa saba prn as rec  No longer on 02/ d/c'd from the med caldendar Rarely coughing up blood always resolved w/in a day  off asa transiently, never has to stop plavix   Not limited by breathing from desired activities  But very inactive   No obvious patterns in day to day or daytime variability or assoc e  mucus plugs or active  hemoptysis or cp or chest tightness, subjective wheeze or overt sinus or hb symptoms. No unusual exp hx or h/o childhood pna/ asthma or knowledge of premature birth.  Sleeping ok without nocturnal  or early am exacerbation  of respiratory  c/o's or need for noct saba. Also denies any obvious fluctuation of symptoms with weather or environmental changes or other aggravating or alleviating factors except as outlined above   Current Medications, Allergies, Complete Past Medical History, Past Surgical History, Family History, and Social History were reviewed in [employer] record.  ROS  The following are not active complaints unless bolded sore throat, dysphagia, dental problems, itching, sneezing,  nasal congestion or excess/ purulent secretions, ear ache,   fever, chills, sweats, unintended wt loss, classically pleuritic or exertional cp,  orthopnea pnd or leg swelling, presyncope, palpitations, abdominal pain, anorexia, nausea, vomiting, diarrhea  or change in bowel or bladder habits, change in stools or urine, dysuria,hematuria,  rash, arthralgias, visual complaints, headache, numbness, weakness or ataxia or problems with walking or coordination,  change in mood/affect or memory.  Objective:   Physical Exam  wt   128  04/26/11 >07/08/2011  134> 134 11/28/2011 > 02/29/2012 136> 05/31/2012  136>  134 11/22/12 > 07/26/2013  136 > 08/21/2013  136 >133 09/06/2013 >  09/12/2013  133 > 132  09/27/2013 > 10/15/2013 131 > 11/07/2013  128 > 01/09/14 124 > 02/12/2014  123 > 04/11/2014   117 > 05/23/2014 119 > 08/19/2014 110 > 10/30/2014  120 >  04/09/2015 120 > 07/08/2015  119 > 10/13/2015  114 > 02/01/2016   117 > 02/29/2016   118 >  06/09/2016  125     amb thin pleasant wm nad  Vital signs reviewed  - Note on arrival 02 sats  95% on RA   SKIN: no rash, lesions  NODES: no lymphadenopathy  HEENT: [place]/AT, EOM- WNL, Conjuctivae- clear, PERRLA, TM-WNL, Nose- clear, Throat- clear and wnl, Mallampati III  NECK: Supple w/ fair ROM, JVD- none, normal carotid impulses w/o bruits Thyroid-  CHEST:clear to A and P  HEART: RRR, no m/g/r heard  ABDOMEN: Soft and nl EXT:  No deformities or restrictions       CXR PA and Lateral:   06/09/2016 :    I personally reviewed images and agree with radiology impression as follows:    Chronic inflammatory changes related to bronchiectasis are stable.

## 2016-06-10 NOTE — Progress Notes (Signed)
Spoke with pt and notified of results per Dr. [name]. Pt verbalized understanding and denied any questions. 

## 2016-06-13 ENCOUNTER — Ambulatory Visit: Payer: Medicare Other | Admitting: Gastroenterology

## 2016-06-14 ENCOUNTER — Other Ambulatory Visit: Payer: Self-pay | Admitting: Internal Medicine

## 2016-06-14 ENCOUNTER — Encounter (HOSPITAL_COMMUNITY)
Admission: RE | Admit: 2016-06-14 | Discharge: 2016-06-14 | Disposition: A | Discharge status: Home / Self Care | Payer: Medicare Other | Source: Ambulatory Visit | Attending: Nephrology | Admitting: Nephrology

## 2016-06-14 DIAGNOSIS — N183 Chronic kidney disease, stage 3 (moderate): Secondary | ICD-10-CM | POA: Diagnosis not present

## 2016-06-14 DIAGNOSIS — N184 Chronic kidney disease, stage 4 (severe): Secondary | ICD-10-CM

## 2016-06-14 LAB — POCT HEMOGLOBIN-HEMACUE: Hemoglobin: 8.8 g/dL — ABNORMAL LOW (ref 13.0–17.0)

## 2016-06-14 LAB — IRON AND TIBC
IRON: 75 ug/dL (ref 45–182)
SATURATION RATIOS: 38 % (ref 17.9–39.5)
TIBC: 199 ug/dL — AB (ref 250–450)
UIBC: 124 ug/dL

## 2016-06-14 LAB — FERRITIN: FERRITIN: 328 ng/mL (ref 24–336)

## 2016-06-14 MED ORDER — EPOETIN ALFA 20000 UNIT/ML IJ SOLN
INTRAMUSCULAR | Status: AC
  Administered 2016-06-14: 20000 [IU]
  Filled 2016-06-14: qty 1

## 2016-06-14 MED ORDER — EPOETIN ALFA 10000 UNIT/ML IJ SOLN
INTRAMUSCULAR | Status: AC
  Administered 2016-06-14: 10000 [IU]
  Filled 2016-06-14: qty 1

## 2016-06-14 MED ORDER — EPOETIN ALFA 40000 UNIT/ML IJ SOLN: 30000.0000 [IU] | INTRAMUSCULAR | Status: DC

## 2016-06-15 NOTE — Telephone Encounter (Signed)
Rx has been sent to the pharmacy electronically. ° °

## 2016-06-21 ENCOUNTER — Encounter (HOSPITAL_COMMUNITY)
Admission: RE | Admit: 2016-06-21 | Discharge: 2016-06-21 | Disposition: A | Discharge status: Home / Self Care | Payer: Medicare Other | Source: Ambulatory Visit | Attending: Nephrology | Admitting: Nephrology

## 2016-06-21 DIAGNOSIS — N183 Chronic kidney disease, stage 3 (moderate): Secondary | ICD-10-CM | POA: Diagnosis not present

## 2016-06-21 DIAGNOSIS — N184 Chronic kidney disease, stage 4 (severe): Secondary | ICD-10-CM

## 2016-06-21 LAB — POCT HEMOGLOBIN-HEMACUE: HEMOGLOBIN: 8.5 g/dL — AB (ref 13.0–17.0)

## 2016-06-21 MED ORDER — EPOETIN ALFA 40000 UNIT/ML IJ SOLN
30000.0000 [IU] | INTRAMUSCULAR | Status: DC
Start: 2016-06-21 — End: 2016-06-22

## 2016-06-21 MED ORDER — EPOETIN ALFA 20000 UNIT/ML IJ SOLN
INTRAMUSCULAR | Status: AC
  Administered 2016-06-21: 20000 [IU] via SUBCUTANEOUS
  Filled 2016-06-21: qty 1

## 2016-06-21 MED ORDER — EPOETIN ALFA 10000 UNIT/ML IJ SOLN
INTRAMUSCULAR | Status: AC
  Administered 2016-06-21: 10000 [IU] via SUBCUTANEOUS
  Filled 2016-06-21: qty 1

## 2016-06-28 ENCOUNTER — Encounter (HOSPITAL_COMMUNITY)
Admission: RE | Admit: 2016-06-28 | Discharge: 2016-06-28 | Disposition: A | Discharge status: Home / Self Care | Payer: Medicare Other | Source: Ambulatory Visit | Attending: Nephrology | Admitting: Nephrology

## 2016-06-28 DIAGNOSIS — N183 Chronic kidney disease, stage 3 (moderate): Secondary | ICD-10-CM | POA: Diagnosis not present

## 2016-06-28 DIAGNOSIS — N184 Chronic kidney disease, stage 4 (severe): Secondary | ICD-10-CM

## 2016-06-28 LAB — POCT HEMOGLOBIN-HEMACUE: HEMOGLOBIN: 9.4 g/dL — AB (ref 13.0–17.0)

## 2016-06-28 MED ORDER — EPOETIN ALFA 10000 UNIT/ML IJ SOLN
INTRAMUSCULAR | Status: AC
  Administered 2016-06-28: 10000 [IU] via SUBCUTANEOUS
  Filled 2016-06-28: qty 1

## 2016-06-28 MED ORDER — EPOETIN ALFA 20000 UNIT/ML IJ SOLN
INTRAMUSCULAR | Status: AC
  Administered 2016-06-28: 20000 [IU] via SUBCUTANEOUS
  Filled 2016-06-28: qty 1

## 2016-06-28 MED ORDER — EPOETIN ALFA 40000 UNIT/ML IJ SOLN: 30000.0000 [IU] | INTRAMUSCULAR | Status: DC

## 2016-07-03 ENCOUNTER — Other Ambulatory Visit: Payer: Self-pay | Admitting: Family Medicine

## 2016-07-04 ENCOUNTER — Other Ambulatory Visit (HOSPITAL_COMMUNITY): Payer: Self-pay | Admitting: *Deleted

## 2016-07-05 ENCOUNTER — Encounter (HOSPITAL_COMMUNITY)
Admission: RE | Admit: 2016-07-05 | Discharge: 2016-07-05 | Disposition: A | Discharge status: Home / Self Care | Payer: Medicare Other | Source: Ambulatory Visit | Attending: Nephrology | Admitting: Nephrology

## 2016-07-05 DIAGNOSIS — N184 Chronic kidney disease, stage 4 (severe): Secondary | ICD-10-CM

## 2016-07-05 DIAGNOSIS — N183 Chronic kidney disease, stage 3 (moderate): Secondary | ICD-10-CM | POA: Diagnosis not present

## 2016-07-05 LAB — POCT HEMOGLOBIN-HEMACUE: HEMOGLOBIN: 9.9 g/dL — AB (ref 13.0–17.0)

## 2016-07-05 MED ORDER — EPOETIN ALFA 10000 UNIT/ML IJ SOLN
INTRAMUSCULAR | Status: AC
  Administered 2016-07-05: 10000 [IU] via SUBCUTANEOUS
  Filled 2016-07-05: qty 1

## 2016-07-05 MED ORDER — EPOETIN ALFA 40000 UNIT/ML IJ SOLN: 30000.0000 [IU] | INTRAMUSCULAR | Status: DC

## 2016-07-05 MED ORDER — EPOETIN ALFA 20000 UNIT/ML IJ SOLN
INTRAMUSCULAR | Status: AC
  Administered 2016-07-05: 20000 [IU] via SUBCUTANEOUS
  Filled 2016-07-05: qty 1

## 2016-07-06 ENCOUNTER — Telehealth: Payer: Self-pay | Admitting: Adult Health

## 2016-07-07 ENCOUNTER — Encounter: Payer: Self-pay | Admitting: Internal Medicine

## 2016-07-07 ENCOUNTER — Ambulatory Visit (INDEPENDENT_AMBULATORY_CARE_PROVIDER_SITE_OTHER): Payer: Medicare Other | Admitting: Internal Medicine

## 2016-07-07 VITALS — BP 106/62 | HR 97 | Ht 67.0 in | Wt 124.4 lb

## 2016-07-07 DIAGNOSIS — M3130 Wegener's granulomatosis without renal involvement: Secondary | ICD-10-CM | POA: Diagnosis not present

## 2016-07-07 DIAGNOSIS — R0602 Shortness of breath: Secondary | ICD-10-CM | POA: Diagnosis not present

## 2016-07-07 DIAGNOSIS — I10 Essential (primary) hypertension: Secondary | ICD-10-CM

## 2016-07-07 DIAGNOSIS — I350 Nonrheumatic aortic (valve) stenosis: Secondary | ICD-10-CM

## 2016-07-07 MED ORDER — CLOPIDOGREL BISULFATE 75 MG PO TABS: ORAL_TABLET | ORAL | 3 refills | Status: DC

## 2016-07-07 MED ORDER — SIMVASTATIN 20 MG PO TABS: ORAL_TABLET | ORAL | 3 refills | Status: AC

## 2016-07-07 NOTE — Telephone Encounter (Signed)
Called and spoke to pt. Pt is requesting TP update his med calendar without a visit. Advised pt the med calendar can only be updated with a visit with all medications in hand to see what medications the pt is taking. Pt states he does not feel the med calendar visit is neccessary and will call back if he decides he wants to come in for a med calendar. Nothing further needed at this time.

## 2016-07-07 NOTE — Patient Instructions (Signed)
Medication Instructions:   NO CHANGE  Testing/Procedures:  Your physician has requested that you have an echocardiogram. Echocardiography is a painless test that uses sound waves to create images of your heart. It provides your doctor with information about the size and shape of your heart and how well your heart's chambers and valves are working. This procedure takes approximately one hour. There are no restrictions for this procedure.SCHEDULE IN MARCH    Follow-Up:  Your physician recommends that you schedule a follow-up appointment in: 4 MONTHS AFTER ECHO WITH DR HILTY   If you need a refill on your cardiac medications before your next appointment, please call your pharmacy.

## 2016-07-08 ENCOUNTER — Encounter: Payer: Medicare Other | Admitting: Adult Health

## 2016-07-08 DIAGNOSIS — I350 Nonrheumatic aortic (valve) stenosis: Secondary | ICD-10-CM | POA: Insufficient documentation

## 2016-07-08 HISTORY — DX: Nonrheumatic aortic (valve) stenosis: I35.0

## 2016-07-08 NOTE — Progress Notes (Signed)
OFFICE NOTE  Chief Complaint:  Dyspnea, follow-up tests  Primary Care Physician: [name], MD  HPI:  Richard [name] is a 80 y.o. male here for follow up after complex hospitalization, with a history of hypothyroidism, HLD, anemia, Wegener's granulomatosis and no prior cardiac disease who presented to Hospital with chest pain 03/13/14, + nuc study and cardiac cath 03/14/14 which revealed severe multivessel CAD involving all 4 major branches. Seen by Dr. Madolyn Frieze who deemed him unsuitable for CABG due to underlying comorbid conditions. ( sts score >30). It was elected to proceed with staged multivessel PCI . On 03/17/14 he had successful PCI to the LAD and POBA to a chronically occluded LCX. There remained high grade proximal disease to a large proximal Ramus branch as well as mid-RCA disease so staged PCI was planned at a later date due to pulmonary issues and renal dysfunction. This was further complicated by right femoral artery pseudoaneurysm requiring repair. PCI on 03/21/14 with rotablator atherectomy with BMS to the mid RCA. Due to length of fluoro time and large volume of contrast, it was decided to medically manage the ramus stenosis which Dr. Angelena Form felt was moderately stenosed and if he has recurrent anginal then consider PCI.   He recently was seen by one of our nurse practitioners who has been working on him to evaluate his anemia. He was also noted to be hyperkalemic. This is required for treatment with Kayexalate and dietary changes. Unfortunate dietary changes as caused him to lose a significant amount of weight. The family is now concerned about what he can do in order to keep his potassium down. He does have stage III chronic kidney disease.   Richard [name] returns today in follow-up. He is been followed by Dr. Jimmy Footman for stage IV kidney disease secondary to Wegener's granulomatosis. He has developed worsening renal function and has a high ANCA titer. He underwent renal biopsy 2  days ago with the thought that he may be able to be switched from Cytoxan over to rituximab. From a cardiovascular standpoint he reports some improvement in his breathing however energy level is still not great. He was doing somewhat better up until his biopsy but he felt like he took somewhat of a step back after that. He is currently holding his antiplatelets but will resume them tomorrow.  I the pleasure see Richard [name] back in the office today. He was recently seen by Dr. Claiborne Billings last week for worsening shortness of breath. Dr. Claiborne Billings felt that this could be worsening angina and ordered a stress test and echocardiogram, additionally he increase his metoprolol from 12.5-25 mg daily and added imdur 30 mg daily. Richard [name] reports that he does not feel any different today than he did last week. He underwent a nuclear stress test which was negative for ischemia. The study was non-gated and therefore an echocardiogram was obtained. The echo demonstrates an improved EF to 60-65%, previously between 40 and 45%. This suggests that his recent revascularization is actually led to some myocardial improvement. He seems to be tolerating the increased dose of beta blocker however blood pressure is low normal today and heart rate is stable. He recently was found to have worsening anemia and has been referred to Dr. Fuller Plan for evaluation of possible acute blood loss.  Richard [name] returns again for follow-up. He recently saw Dr. Jannette Spanner kidney. He was noted to have worsening shortness of breath. It was not thought that this is related to his renal disease  but more likely worsening pulmonary function. Richard [name] is previously on oxygen but was taken off of it. It may be that he needs to go back on that. He also has a history of cardiomyopathy which improved after percutaneous intervention. I'm wondering if he's developed any worsening LV dysfunction to explain his worsening shortness of breath. He also has had more  productive cough with sputum, more likely related to worsening Wegener's disease.  07/07/2016  Richard [name] was seen back in follow-up today. In general his main complaints have to do with worsening shortness of breath. He feels that this is likely related to his pulmonary disease, however he has had cardiomyopathy in the past. His EF has improved recently after intervention. He is due for reassessment. He is known to have moderate aortic stenosis. It is not clear whether his candidate for TAVR, but most certainly is not a candidate for traditional aortic valve replacement due to his pulmonary and renal disease. Blood pressure is well-controlled today. He denies any worsening chest pain.  PMHx:  Past Medical History:  Diagnosis Date  . Adenomatous colon polyp   . Anemia   . Asthma   . BPH (benign prostatic hyperplasia)    sees Dr. Risa Grill, biopsy June 2015 was benign   . Bronchiectasis ([place])   . CAD (coronary artery disease)    a. [place] s/p [place] x3 and ultimately BMS to pLAD& POBA to CTO of circumflex/marginal vessel on 03/17/14 and staged BMS to [facility] on 03/21/14  . Cataract   . Diverticulosis   . Femoral artery pseudo-aneurysm, right ([place])    a. s/p repair. Follow up with Dr. Kellie Simmering   . Fungal infection    lungs  . GERD (gastroesophageal reflux disease)   . Hiatal hernia   . HOH (hard of hearing)    bilaterally  . HTN (hypertension) 09/28/2013  . On home oxygen therapy    uses 2 l/m nasally at bedtime  . Peptic stricture of esophagus   . Wegener's granulomatosis ([place])    sees Dr. [name]     Past Surgical History:  Procedure Laterality Date  . CARDIAC CATHETERIZATION  03/17/2014   Procedure: CORONARY BALLOON ANGIOPLASTY;  Surgeon: Troy Sine, MD;  Location: [facility] CATH LAB;  Service: Cardiovascular;;  . CATARACT EXTRACTION, BILATERAL  12-18-12   bilateral  . CHOLECYSTECTOMY N/A 12/21/2012   Procedure: LAPAROSCOPIC CHOLECYSTECTOMY WITH INTRAOPERATIVE CHOLANGIOGRAM;  Surgeon: Edward Jolly, MD;  Location: [facility] ORS;  Service: General;  Laterality: N/A;  . COLONOSCOPY  08-16-11   per Dr. Fuller Plan, diverticulosis and polyps, repeat in 5 yrs   . CORONARY ANGIOPLASTY  03/2014  . ESOPHAGOGASTRODUODENOSCOPY (EGD) WITH ESOPHAGEAL DILATION  11-29-10   per Dr. Fuller Plan   . EYE SURGERY     cataracts removed.   Marland Kitchen HEMATOMA EVACUATION Right 03/19/2014   Procedure: Suture repair of femoral artery with evacuation of hematoma;  Surgeon: Mal Misty, MD;  Location: [place];  Service: Vascular;  Laterality: Right;  . HERNIA REPAIR    . LEFT HEART CATHETERIZATION WITH CORONARY ANGIOGRAM N/A 03/14/2014   Procedure: LEFT HEART CATHETERIZATION WITH CORONARY ANGIOGRAM;  Surgeon: Leonie Man, MD;  Location: [facility] CATH LAB;  Service: Cardiovascular;  Laterality: N/A;  . PERCUTANEOUS CORONARY STENT INTERVENTION (PCI-S) N/A 03/17/2014   Procedure: PERCUTANEOUS CORONARY STENT INTERVENTION (PCI-S);  Surgeon: Troy Sine, MD;  Location: [facility] CATH LAB;  Service: Cardiovascular;  Laterality: N/A;  . PERCUTANEOUS CORONARY STENT INTERVENTION (PCI-S) N/A 03/21/2014   Procedure:  PERCUTANEOUS CORONARY STENT INTERVENTION (PCI-S);  Surgeon: Burnell Blanks, MD;  Location: [facility] CATH LAB;  Service: Cardiovascular;  Laterality: N/A;  . RENAL BIOPSY      FAMHx:  Family History  Problem Relation Age of Onset  . Asthma Father   . Coronary artery disease Brother   . Coronary artery disease Brother   . Coronary artery disease Mother   . Colon cancer Neg Hx   . Stomach cancer Neg Hx     SOCHx:   reports that he has never smoked. He has never used smokeless tobacco. He reports that he does not drink alcohol or use drugs.  ALLERGIES:  No Known Allergies  ROS: Pertinent items noted in HPI and remainder of comprehensive ROS otherwise negative.  HOME MEDS: Current Outpatient Prescriptions  Medication Sig Dispense Refill  . Acetaminophen (TYLENOL) 325 MG CAPS Take by mouth. TAKE PER BOX AS NEEDED FOR PAIN    .  albuterol (PROAIR HFA) 108 (90 BASE) MCG/ACT inhaler Inhale 2 puffs into the lungs every 6 (six) hours as needed for wheezing or shortness of breath. (Patient taking differently: Inhale 2 puffs into the lungs every 6 (six) hours as needed for wheezing or shortness of breath (PLAN B). ) 1 Inhaler 3  . aluminum-magnesium hydroxide-simethicone (MAALOX) [id number] MG/5ML SUSP Take by mouth. TAKE PER BOTTLE AS NEEDED FOR EXTRA HEARTBURN    . aspirin EC 81 MG tablet Take 81 mg by mouth daily.     Marland Kitchen b complex vitamins tablet Take 1 tablet by mouth daily.      . bisacodyl (DULCOLAX) 5 MG EC tablet Take 5 mg by mouth 2 (two) times daily.     . calcitRIOL (ROCALTROL) 0.25 MCG capsule Take 1 capsule by mouth daily.   4  . clopidogrel (PLAVIX) 75 MG tablet TAKE ONE TABLET BY MOUTH ONCE DAILY 90 tablet 3  . Cyclophosphamide 50 MG CAPS Take 1 capsule (50 mg total) by mouth daily. Take on an empty stomach 1 hour before or 2 hours after meals.  Currently stopped in the hospital. Please follow-up with your PCP or Pulmonologist regarding decision to resume. (Patient taking differently: Take 50 mg by mouth daily. )    . finasteride (PROSCAR) 5 MG tablet TAKE ONE TABLET BY MOUTH ONCE DAILY 90 tablet 0  . HYDROcodone-homatropine (HYDROMET) 5-1.5 MG/5ML syrup Take 5 mLs by mouth every 4 (four) hours as needed. 240 mL 0  . ipratropium-albuterol (DUONEB) 0.5-2.5 (3) MG/3ML SOLN Take 3 mLs by nebulization every 4 (four) hours as needed (if inhaler is not effective in resolving shortness of breath or wheezing). (Patient taking differently: Take 3 mLs by nebulization every 4 (four) hours as needed (WITH FLUTTER VALVE FOR WHEEZING, SHORTNESS OF BREATH-PLAN C). ) 360 mL 11  . levofloxacin (LEVAQUIN) 750 MG tablet Take 750 mg by mouth daily.    Marland Kitchen loratadine (CLARITIN) 10 MG tablet Take 10 mg by mouth every morning.     . metoprolol succinate (TOPROL-XL) 25 MG 24 hr tablet TAKE ONE TABLET BY MOUTH ONCE DAILY **PATIENT NEEDS OFFICE  VISIT** 30 tablet 0  . nitroGLYCERIN (NITROSTAT) 0.4 MG SL tablet Place 1 tablet (0.4 mg total) under the tongue every 5 (five) minutes x 3 doses as needed for chest pain. 25 tablet 12  . ranitidine (ZANTAC) 300 MG capsule Take 1 capsule (300 mg total) by mouth 2 (two) times daily. This is the correct rx, d/c qd rx 60 capsule 11  . simethicone (MYLICON) 0000000 MG  chewable tablet TAKE PER BOX FOR GAS    . simvastatin (ZOCOR) 20 MG tablet TAKE ONE TABLET BY MOUTH ONCE DAILY AT  6  PM 90 tablet 3  . sodium bicarbonate 650 MG tablet Take 2 tablets (1,300 mg total) by mouth 2 (two) times daily. NEED OV. 120 tablet 1  . SYMBICORT 160-4.5 MCG/ACT inhaler INHALE TWO PUFFS BY MOUTH TWICE DAILY 3 Inhaler 3  . tamsulosin (FLOMAX) 0.4 MG CAPS capsule TAKE ONE CAPSULE BY MOUTH ONCE DAILY IN THE EVENING 30 capsule 5   No current facility-administered medications for this visit.     LABS/IMAGING: No results found for this or any previous visit (from the past 48 hour(s)). No results found.  VITALS: BP 106/62   Pulse 97   Ht 5\' 7"  (1.702 m)   Wt 124 lb 6.4 oz (56.4 kg)   BMI 19.48 kg/m   EXAM: General appearance: alert, appears older than stated age and no distress Neck: no carotid bruit and no JVD Lungs: diminished breath sounds bilaterally and rhonchi bilaterally Heart: regular rate and rhythm, S1, S2 normal and systolic murmur: early systolic 3/6, crescendo at 2nd right intercostal space Abdomen: soft, non-tender; bowel sounds normal; no masses,  no organomegaly Extremities: extremities normal, atraumatic, no cyanosis or edema Pulses: 2+ and symmetric Skin: Pale, cool skin Neurologic: Grossly normal Psych: Normal  EKG: Sinus rhythm with first-degree AV block and PVCs at 97  ASSESSMENT: 1. Progressive dyspnea 2. Aortic stenosis 3. History of Wegener's granulomatosis on immunosuppression 4. Multivessel CAD s/p PCI to the LAD, LCX and RCA - recent low risk nuclear stress test without ischemia  and a small fixed defect concerning for scar  5. Stage III CKD 6. Hyperkalemia 7. Ischemic cardiomyopathy, EF 40% -> Now 60-65% 7.   Anemia  PLAN: 1.   Mr. Hunsaker again has some progressive dyspnea on exertion. His EF had normalized after percutaneous intervention, but I would like to get an echo to rule out worsening cardiomyopathy or worsening valvular heart disease. He does have a quite loud murmur today on exam and was noted to have at least moderate aortic stenosis. He is not likely to be a traditional aVR candidate but might be a TAVR candidate.  Follow-up in 6 months.  Pixie Casino, MD, [facility] Attending Cardiologist [place] C Lenford Beddow 07/08/2016, 4:38 PM

## 2016-07-11 ENCOUNTER — Other Ambulatory Visit: Payer: Self-pay | Admitting: Internal Medicine

## 2016-07-12 ENCOUNTER — Encounter (HOSPITAL_COMMUNITY)
Admission: RE | Admit: 2016-07-12 | Discharge: 2016-07-12 | Disposition: A | Discharge status: Home / Self Care | Payer: Medicare Other | Source: Ambulatory Visit | Attending: Nephrology | Admitting: Nephrology

## 2016-07-12 ENCOUNTER — Other Ambulatory Visit: Payer: Self-pay | Admitting: Internal Medicine

## 2016-07-12 DIAGNOSIS — N183 Chronic kidney disease, stage 3 (moderate): Secondary | ICD-10-CM | POA: Diagnosis not present

## 2016-07-12 DIAGNOSIS — Z79899 Other long term (current) drug therapy: Secondary | ICD-10-CM | POA: Diagnosis not present

## 2016-07-12 DIAGNOSIS — D631 Anemia in chronic kidney disease: Secondary | ICD-10-CM | POA: Insufficient documentation

## 2016-07-12 DIAGNOSIS — Z5181 Encounter for therapeutic drug level monitoring: Secondary | ICD-10-CM | POA: Diagnosis not present

## 2016-07-12 DIAGNOSIS — N184 Chronic kidney disease, stage 4 (severe): Secondary | ICD-10-CM

## 2016-07-12 LAB — POCT HEMOGLOBIN-HEMACUE: HEMOGLOBIN: 10 g/dL — AB (ref 13.0–17.0)

## 2016-07-12 LAB — IRON AND TIBC
Iron: 31 ug/dL — ABNORMAL LOW (ref 45–182)
Saturation Ratios: 18 % (ref 17.9–39.5)
TIBC: 175 ug/dL — ABNORMAL LOW (ref 250–450)
UIBC: 144 ug/dL

## 2016-07-12 LAB — FERRITIN: Ferritin: 422 ng/mL — ABNORMAL HIGH (ref 24–336)

## 2016-07-12 MED ORDER — ALBUTEROL SULFATE HFA 108 (90 BASE) MCG/ACT IN AERS: 1.0000 | INHALATION_SPRAY | RESPIRATORY_TRACT | 2 refills | Status: AC | PRN

## 2016-07-12 MED ORDER — EPOETIN ALFA 10000 UNIT/ML IJ SOLN
INTRAMUSCULAR | Status: AC
  Administered 2016-07-12: 14:00:00 10000 [IU] via SUBCUTANEOUS
  Filled 2016-07-12: qty 1

## 2016-07-12 MED ORDER — EPOETIN ALFA 20000 UNIT/ML IJ SOLN
INTRAMUSCULAR | Status: AC
  Administered 2016-07-12: 14:00:00 20000 [IU] via SUBCUTANEOUS
  Filled 2016-07-12: qty 1

## 2016-07-12 MED ORDER — EPOETIN ALFA 40000 UNIT/ML IJ SOLN: 30000.0000 [IU] | INTRAMUSCULAR | Status: DC

## 2016-07-12 NOTE — Telephone Encounter (Signed)
Current Med list mailed back to pt. Nothing further needed.

## 2016-07-14 ENCOUNTER — Encounter: Payer: Self-pay | Admitting: Adult Health

## 2016-07-14 ENCOUNTER — Ambulatory Visit (INDEPENDENT_AMBULATORY_CARE_PROVIDER_SITE_OTHER): Payer: Medicare Other | Admitting: Adult Health

## 2016-07-14 VITALS — BP 122/74 | HR 88 | Ht 67.0 in | Wt 124.8 lb

## 2016-07-14 DIAGNOSIS — J479 Bronchiectasis, uncomplicated: Secondary | ICD-10-CM

## 2016-07-14 DIAGNOSIS — J471 Bronchiectasis with (acute) exacerbation: Secondary | ICD-10-CM

## 2016-07-14 NOTE — Progress Notes (Signed)
Chart and office note reviewed in detail  > agree with a/p as outlined    

## 2016-07-14 NOTE — Patient Instructions (Signed)
Continue on current regimen  Check sputum culture today .  Follow med calendar closely and bring to each visit.  follow up Dr. [name]  In 3 months and As needed

## 2016-07-14 NOTE — Progress Notes (Signed)
Subjective:    Patient ID: Richard [name], male    DOB: 02-06-1936     MRN: [id number]   Brief patient profile:  69  yowm never smoker with dx of Longstanding bronchiectasis then dx with WG 03/2010 > requested establish with [name].   Admit [place] dx WG by pos c anca 03/2010, supportive tbbx but not vats   Admit [name] dx spont L Ptx 9/08/15/09 requiring Chest tube but no surgery   11/23/2012 f/u ov/[name] re WG/ bronchiectasis Chief Complaint  Patient presents with  . Follow-up    Had PFT today-sob same,cough-yellow,wheezing occass.,  no hemoptysis, no real limiting sob though sedentary, no arthralgias or rash maintaining 2.5 mg daily No change prednisone 5 mg one half daily  > ok to try every other day if doing great May 1 flare rx levaquin and pred May 16 lap chole [facility]   08/19/2013 f/u ov/[name] re: ? Recurrent WG Chief Complaint  Patient presents with  . Follow-up    Breathing is unchanged. Cough unchanged-prod cpugh w/ yellow phlem. On 2nd dose of cipro  now on day 5/10 cipro and less dark, never bloody, no sinus complaints, arthritis rash or fever or hematuria. >ANCA 1:80 titer, started on cytoxan          Patient ID: Richard [name]  MRN: [id number], DOB/AGE: 01-Apr-1936 80 y.o.  Admit date: 03/13/2014  D/C date: 03/26/2014   Primary Cardiologist: Dr. Debara Pickett  Principal Problem:  Unstable Class III Angina  Active Problems:  HYPOTHYROIDISM  HYPERLIPIDEMIA  ANEMIA  Wegener's granulomatosis  Chronic respiratory failure  Abnormal nuclear stress test: Intermediate risk with moderate region of apical and inferior scar with mild superimposed ischemia; EF 40%  CAD- severe 4 V CAD- not CABG candidate  Anemia- transfused 03/16/14  Chronic renal insufficiency, stage III (moderate)  Cardiomyopathy, ischemic- EF 40% by Myoview  Acute blood loss anemia  Femoral artery pseudo-aneurysm, right  Admission Dates: 03/13/14 - 03/26/14  Discharge Diagnosis: [place] s/p [facility] x3 and ultimately BMS to  pLAD& POBA to CTO of circumflex/marginal vessel on 03/17/14 and staged BMS to mRCA on 03/21/14  HPI: Richard [name] is a 80 y.o. male with a history of hypothyroidism, HLD, anemia, Wegener's granulomatosis and no prior cardiac disease who presented to [facility] ED on 03/13/14 with chest pain and was transferred to [facility] for cath the following morning.  PMH significant for Wegener's granulomatosis and follows with Dr. [name] for his pulmonary disease (severe multifocal bronchiectasis as well as numerous cavitary and non-cavitary nodules). He had been having chest tightness with significant exertion for the last few months with increasing dyspnea on exertion. Also had 2 brothers who both had CABG. Referred to Dr. Debara Pickett who proceeded with nuclear stress testing which demonstrated a "moderate region of apical and inferior scar with mild superimposed qualitative ischemia". LV wall motion demonstrated a "moderate degree of hypokinesis involving the mid-distal inferior wall and apex with an EF of 40%". Prior echo dated 03/30/2010 showed EF 45-50%.  Hospital Course: He presented to [facility] ED with increasing fatigue and chest tightness which developed that morning, which resolved within 10 minutes.  He was given ASA, heparin, and nitro paste. ECG demonstrated sinus tachycardia with no acute ST-T abnormalities. POC troponin normal. Also noted to be anemic, Hgb 9 (10 on 12/06/13). Dr. [name] noted that Richard [name] has had hemoptysis in the past, therefore limiting DAPT was ideal and BMS over DES thought to be the best option.  [place]/CAD:  -- Abnormal outpatient  nuclear stress test on 03/12/14: Intermediate risk with moderate region of apical and inferior scar with mild superimposed ischemia; EF 40%  -- s/p first LHC on 03/14/14 which revealed severe multivessel CAD involving all 4 major branches. Seen by Dr. Madolyn Frieze on this admission who deemed him unsuitable for CABG due to underlying comorbid conditions. ( sts score >30). It was elected to  proceed with staged multivessel PCI .  -- s/p LHC on 03/17/14 with successful PCI to the LAD and POBA to a chronically occluded LCX. There remained high grade proximal disease to a large proximal Ramus branch as well as mid-RCA disease so staged PCI was planned at a later date due to pulmonary issues and renal dysfunction. This was further complicated by right femoral artery pseudoaneurysm requiring repair. LHC on 03/21/14 s/p PCI with rotablator atherectomy with BMS to the mid RCA. Due to length of fluoro time and large volume of contrast, it was decided to medically manage the ramus stenosis which Dr. Angelena Form felt was moderately stenosed and if he has recurrent anginal then consider PCI.  -- Continue ASA/BB/Plavix/statin  Right femoral pseudoaneurysm: s/p suture repair of femoral artery with evacuation of hematoma on 03/19/14. Still with significant hematoma down buttox and hamstring but improving. CBC stable at 7.7.  -- Has follow up with Dr. Kellie Simmering on 04/08/2014  Anemia: subsequent to right femoral pseudoaneurysm- s/p surgical repair. S/p blood transfusion x 2. Hgb improved from 7.4 to 8.6. Stable at 7.7 on discharge.  --Will send home on Iron 324 mg daily with colace 100 mg BID to help prevent constipation  SOB - BNP elevated at 4937. D-Dimer elevated at 2.6. CTA with no acute PE. No s/s of volume overload  -- Though to be possibly due to anemia. Chest xray showed multiple cavitary lesions but no CHF. CTA with multiple abnormalities possibly reflecting acute bronchitis vs organizing PNA. PCCM (followed by Dr. [name]) consulted who felt that the CT was at his baseline and to continue current therapy. He thought his SOB was likely multifactorial and anemia also playing a role.  -- BNP elevated at 4937. D-Dimer elevated at 2.6. CTA with no acute PE. No s/s of volume overload  Chronic systolic CHF- 2 D ECHO with LVEF 40-45%, mid to distal inferoapical and apical hypokinesis, diastolic dysfunction with elevated  LV filling pressure, mild to moderate RV hypokinesis.  Wegener's granulomatosis. ( per PCCM)  -- Continue cytoxan, prednisone  -- Bactrim for PCP prophylaxis  -- Will need pulmonary rehab as an outpatient  Chronic renal insufficiency, stage III (moderate).  -- Creat elevated but stable after contrast exposure for CTA  -- Creat stable at 1.48 today. Continue to monitor  The patient has had a prolonged hospital course but is recovering well. The femoral catheter site is stable. He has been seen by Dr. Irish Lack today and deemed ready for discharge home. All follow-up appointments have been scheduled. Discharge medications are listed below.         09/02/2014 Follow up : Bronchiectasis/NP Patient returns for a two-week follow-up. Last visit. Patient with a bronchiectatic exacerbation. He was treated with Augmentin 10 days and a prednisone burst. Patient has started to slowly feel improved with decreased discolored mucus. Shortness of breath is not quite as bad. He has made very slow progress. He denies any hemoptysis, orthopnea, PND, leg swelling or calf pain. Appetite is fair. No nausea, vomiting or diarrhea. Chest x-ray last visit showed chronic changes rec Continue on current regimen  Continue with VEST  therapy.     10/30/2014 f/u ov/[name] re: bronchiectasis/AB Chief Complaint  Patient presents with  . Follow-up    Pt c/o increased SOB and chest congestion for the past 5 days. Cough is prod with yellow sputum.     rx with levaquin x 2 days but only used saba twice ? How much can you use it if you are having a bad day with your breathing A Not sure Baseline = sob x more than slow adls rec Increase prednisone to 15 mg per day until better then back to 7.5 mg daily  Augmentin 875 mg take one pill twice daily  X 10 days - take at breakfast and supper with large glass of water.  It would help reduce the usual side effects (diarrhea and yeast infections) if you ate cultured yogurt at  lunch.  For breathing/coughing/ congestion >  Up to 2 puff every 4 hours as needed  Make sure you are taking pepcid 20 mg after bfast and supper  See Tammy NP w/in 2 weeks with all your medications > did not do     07/08/2015  f/u ov/[name] re: bronchiectasis/ on pred 7.5 mg daily / 50 mg daily cytoxan per renal  Chief Complaint  Patient presents with  . Follow-up    Pt c/o continued dyspnea with exertion and wet cough with yellow mucus. Pt denies wheeze/CP/tightness.   not using flutter / not understanding contingencies prev listed on his home made med calendar but no longer using it anyway   maint on sym 160 2bid with baseline doe = MMRC1 = can walk nl pace, flat grade, can't hurry or go uphills or steps s sob  Not sure vest helping and not using as consistently   rec Levaquin 750 mg  Daily x 5-7 days when mucus gets nasty  Use the flutter valve just as much as you can   10/13/2015  f/u ov/[name] re: WG/ bronchiectasis on 7 day cycles of levaquin/ not using flutter / is using vest up to an hour a day / pred 7.5/ctx 50  Chief Complaint  Patient presents with  . Follow-up    Breathing is unchanged. His cough is prod with moderate yellow sputum.   first thing in am is worst time for breathing and then takes symbicort 160 x 2 and better/ rare need for saba  Doe x MMRC1 = can walk nl pace, flat grade, can't hurry or go uphills or steps s sob   levaquin 750 x 7 days improves mucus and never bloody,  Helps for at least  2 weeks then usually  recurs  rec No change rx   01/07/16 exac seen in ER > rx pred 15/ augmentin   02/01/2016  f/u ov/[name] re: obst bronchiectasis maint rx = symbicort 160 2 bid and duoneb bid Chief Complaint  Patient presents with  . Follow-up    PFT done today.  Pt went to ED in Bronchiectasis flare 01/07/16- was txed with Augmentin. He is using duoneb 2 x daily. His cough is prod with yellow sputum.  back to baseline doe = MMRC=1  but confused with meds again , esp  prns rec Plan A = Automatic = Symbicort 160 Take 2 puffs first thing in am and then another 2 puffs about 12 hours later.  Plan B = Backup Only use your albuterol (Red =Proair/ yellow = Proventil) as a rescue medication  Plan C = Crisis - only use your albuterol/ipatropium(duoneb) nebulizer if you first try  Plan B   02/29/2016 acute extended ov/[name] re:  Hemoptysis /obst bronchiectasis on symb 160 2bid Chief Complaint  Patient presents with  . Acute Visit    Pt c/o hemoptysis since 7/22. Blood is bright red in color. He states he feels very fatigued today.   coughing more than usual x 02/27/16 pm a total of about one half cup of blood and turned yellow am of ov but has used none of his prev contingency /action plans and is due in next week for a formal med calendar that will provide this in writing  Doe no change baseline and no increase in saba use  >>hold asa , levaquin x 7   03/08/2016 NP Follow up : Bronchiectaisis  Patient returns for a one-week follow-up. Patient had a recent flare bronchiectasis. Last visit with hemoptysis. Was instructed to hold aspirin. Started on Levaquin for 1 week. Chest x-ray shows chronic changes of bronchiectasis. Labs showed stable hemoglobin. Sedimentation rate was normal at 8. Since last visit. Patient is feeling better. Hemoptysis has stopped.  We reviewed all his medications organize them into a medication count with patient education Appears to taking correctly.  We discussed using flutter valve more.  He could not use VEST in past.  rec Continue on current regimen  Follow med calendar closely and bring to each visit.  follow up Dr. [name]  In 3 months and As needed      06/09/2016  f/u ov/[name] re: obst bronchiectasis/ WG - has med calendar not using  maint rx symbicort 160 2bid /pred/ctx per renal Chief Complaint  Patient presents with  . Follow-up    cough with dark yellow sputum, finished levaquin about 5 days ago and feels that this did not  help.   levaquin 750 daily x 7 days  No longer helping cough/ purulent sputum  Confused with use of med calendar > using neb twice daily duoneb/ not hfa saba prn as rec  No longer on 02/ d/c'd from the med caldendar Rarely coughing up blood always resolved w/in a day  off asa transiently, never has to stop plavix  >>augmentin x 10   07/14/2016 Follow up : Bronchiectasis /WG  Pt returns for 1 month follow up . Had a bronchiectatic flare last ov , tx w/ 10 d of augmentin. Says he is feeling better. Still has cough and congestion although less .  Mucus varies with white to yellow at time. Sometimes hard to get up .  No fever, weight loss, chest pain.  orthopnea,   We reviewed his medications and organized them into a med calendar with pt education . HAS A VERY NICE MEDICATION LIST THAT HE CHECKS OFF EACH DAY AND IS TAKING MEDICATIONS CORRECTLY.    Current Medications, Allergies, Complete Past Medical History, Past Surgical History, Family History, and Social History were reviewed in [employer] record.  ROS  The following are not active complaints unless bolded sore throat, dysphagia, dental problems, itching, sneezing,  nasal congestion or excess/ purulent secretions, ear ache,   fever, chills, sweats, unintended wt loss, classically pleuritic or exertional cp,  orthopnea pnd or leg swelling, presyncope, palpitations, abdominal pain, anorexia, nausea, vomiting, diarrhea  or change in bowel or bladder habits, change in stools or urine, dysuria,hematuria,  rash, arthralgias, visual complaints, headache, numbness, weakness or ataxia or problems with walking or coordination,  change in mood/affect or memory.  Objective:   Physical Exam  wt   128  04/26/11 >07/08/2011  134> 134 11/28/2011 > 02/29/2012 136> 05/31/2012  136>  134 11/22/12 > 07/26/2013  136 > 08/21/2013  136 >133 09/06/2013 >  09/12/2013  133 > 132 09/27/2013 > 10/15/2013 131 > 11/07/2013  128 > 01/09/14 124  > 02/12/2014  123 > 04/11/2014   117 > 05/23/2014 119 > 08/19/2014 110 > 10/30/2014  120 >  04/09/2015 120 > 07/08/2015  119 > 10/13/2015  114 > 02/01/2016   117 > 02/29/2016   118 >  06/09/2016  125    Vitals:   07/14/16 1516  BP: 122/74  Pulse: 88  SpO2: 100%  Weight: 124 lb 12.8 oz (56.6 kg)  Height: 5\' 7"  (1.702 m)     amb thin pleasant wm nad  Vital signs reviewed  - Note on arrival 02 sats  100% on RA   SKIN: no rash, lesions  NODES: no lymphadenopathy  HEENT: [place]/AT, EOM- WNL, Conjuctivae- clear, PERRLA, TM-WNL, Nose- clear, Throat- clear and wnl, Mallampati III  NECK: Supple w/ fair ROM, JVD- none, normal carotid impulses w/o bruits Thyroid-  CHEST:decreased BS in bases  HEART: RRR, no m/g/r heard  ABDOMEN: Soft and nl EXT:  No deformities or restrictions       CXR PA and Lateral:   06/09/2016 :     Chronic inflammatory changes related to bronchiectasis are stable.

## 2016-07-15 NOTE — Assessment & Plan Note (Addendum)
Recent flare seems to be resolving  Hold on additional abx at this time Check sputum cx and sputum AFB  Patient's medications were reviewed today and patient education was given. Computerized medication calendar was adjusted/completed   Plan  Patient Instructions  Continue on current regimen  Check sputum culture today .  Follow med calendar closely and bring to each visit.  follow up Dr. [name]  In 3 months and As needed

## 2016-07-18 ENCOUNTER — Other Ambulatory Visit: Payer: Self-pay | Admitting: Internal Medicine

## 2016-07-18 NOTE — Telephone Encounter (Signed)
REFILL 

## 2016-07-19 ENCOUNTER — Encounter (HOSPITAL_COMMUNITY)
Admission: RE | Admit: 2016-07-19 | Discharge: 2016-07-19 | Disposition: A | Discharge status: Home / Self Care | Payer: Medicare Other | Source: Ambulatory Visit | Attending: Nephrology | Admitting: Nephrology

## 2016-07-19 DIAGNOSIS — N183 Chronic kidney disease, stage 3 (moderate): Secondary | ICD-10-CM | POA: Diagnosis not present

## 2016-07-19 DIAGNOSIS — N184 Chronic kidney disease, stage 4 (severe): Secondary | ICD-10-CM

## 2016-07-19 LAB — POCT HEMOGLOBIN-HEMACUE: HEMOGLOBIN: 9.7 g/dL — AB (ref 13.0–17.0)

## 2016-07-19 MED ORDER — EPOETIN ALFA 40000 UNIT/ML IJ SOLN: 30000.0000 [IU] | INTRAMUSCULAR | Status: DC

## 2016-07-19 MED ORDER — EPOETIN ALFA 10000 UNIT/ML IJ SOLN
INTRAMUSCULAR | Status: AC
  Administered 2016-07-19: 12:00:00 10000 [IU] via SUBCUTANEOUS
  Filled 2016-07-19: qty 1

## 2016-07-19 MED ORDER — EPOETIN ALFA 20000 UNIT/ML IJ SOLN
INTRAMUSCULAR | Status: AC
  Administered 2016-07-19: 20000 [IU] via SUBCUTANEOUS
  Filled 2016-07-19: qty 1

## 2016-07-22 ENCOUNTER — Other Ambulatory Visit: Payer: Medicare Other

## 2016-07-22 DIAGNOSIS — J479 Bronchiectasis, uncomplicated: Secondary | ICD-10-CM

## 2016-07-26 ENCOUNTER — Encounter (HOSPITAL_COMMUNITY)
Admission: RE | Admit: 2016-07-26 | Discharge: 2016-07-26 | Disposition: A | Discharge status: Home / Self Care | Payer: Medicare Other | Source: Ambulatory Visit | Attending: Nephrology | Admitting: Nephrology

## 2016-07-26 DIAGNOSIS — D638 Anemia in other chronic diseases classified elsewhere: Secondary | ICD-10-CM | POA: Diagnosis present

## 2016-07-26 DIAGNOSIS — N184 Chronic kidney disease, stage 4 (severe): Secondary | ICD-10-CM | POA: Insufficient documentation

## 2016-07-26 LAB — POCT HEMOGLOBIN-HEMACUE: HEMOGLOBIN: 8.4 g/dL — AB (ref 13.0–17.0)

## 2016-07-26 MED ORDER — EPOETIN ALFA 20000 UNIT/ML IJ SOLN
INTRAMUSCULAR | Status: AC
  Administered 2016-07-26: 20000 [IU]
  Filled 2016-07-26: qty 1

## 2016-07-26 MED ORDER — EPOETIN ALFA 10000 UNIT/ML IJ SOLN
INTRAMUSCULAR | Status: AC
  Administered 2016-07-26: 12:00:00 10000 [IU]
  Filled 2016-07-26: qty 1

## 2016-07-26 MED ORDER — EPOETIN ALFA 40000 UNIT/ML IJ SOLN: 30000.0000 [IU] | INTRAMUSCULAR | Status: DC

## 2016-07-27 ENCOUNTER — Encounter: Payer: Self-pay | Admitting: Gastroenterology

## 2016-07-27 ENCOUNTER — Ambulatory Visit (INDEPENDENT_AMBULATORY_CARE_PROVIDER_SITE_OTHER): Payer: Medicare Other | Admitting: Gastroenterology

## 2016-07-27 VITALS — BP 84/50 | HR 84 | Ht 67.0 in | Wt 122.2 lb

## 2016-07-27 DIAGNOSIS — K2960 Other gastritis without bleeding: Secondary | ICD-10-CM

## 2016-07-27 DIAGNOSIS — R195 Other fecal abnormalities: Secondary | ICD-10-CM | POA: Diagnosis not present

## 2016-07-27 DIAGNOSIS — N184 Chronic kidney disease, stage 4 (severe): Secondary | ICD-10-CM

## 2016-07-27 DIAGNOSIS — D631 Anemia in chronic kidney disease: Secondary | ICD-10-CM

## 2016-07-27 NOTE — Patient Instructions (Signed)
Thank you for choosing me and [facility].  Malcolm T. Stark, Jr., MD., FACG  

## 2016-07-27 NOTE — Progress Notes (Signed)
History of Present Illness: This is a 80 year old male with chronic anemia and recently Hemoccult positive stool was noted. He is accompanied by his wife. He has had a few episodes of epistaxis. Pt denies hematochezia, melena. He was seen for anemia with Hemoccult negative stool in August 2016. Recent Hb=10.2. He feel SOB after bowel movements as well as with minimal exertion such as walking to his bathroom in his house. Occasional reflux symptoms.    EGD 10/2014: esophageal stricture, HH, erosive gastritis-biopsies revealed reactive gastropathy  Colonoscopy 08/2011: 1 small adenomatous polyp and diverticulosis.  No Known Allergies Outpatient Medications Prior to Visit  Medication Sig Dispense Refill  . Acetaminophen (TYLENOL) 325 MG CAPS Take by mouth. TAKE PER BOX AS NEEDED FOR PAIN    . albuterol (VENTOLIN HFA) 108 (90 Base) MCG/ACT inhaler Inhale 1-2 puffs into the lungs every 4 (four) hours as needed for wheezing or shortness of breath. 1 Inhaler 2  . aspirin EC 81 MG tablet Take 81 mg by mouth daily.     Marland Kitchen b complex vitamins tablet Take 1 tablet by mouth daily.      . bisacodyl (DULCOLAX) 5 MG EC tablet Take 5 mg by mouth 2 (two) times daily.     . calcitRIOL (ROCALTROL) 0.25 MCG capsule Take 1 capsule by mouth daily.   4  . clopidogrel (PLAVIX) 75 MG tablet TAKE ONE TABLET BY MOUTH ONCE DAILY 90 tablet 3  . finasteride (PROSCAR) 5 MG tablet TAKE ONE TABLET BY MOUTH ONCE DAILY 90 tablet 0  . HYDROcodone-homatropine (HYDROMET) 5-1.5 MG/5ML syrup Take 5 mLs by mouth every 4 (four) hours as needed. 240 mL 0  . ipratropium-albuterol (DUONEB) 0.5-2.5 (3) MG/3ML SOLN Take 3 mLs by nebulization every 4 (four) hours as needed (if inhaler is not effective in resolving shortness of breath or wheezing). (Patient taking differently: Take 3 mLs by nebulization every 4 (four) hours as needed (WITH FLUTTER VALVE FOR WHEEZING, SHORTNESS OF BREATH-PLAN C). ) 360 mL 11  . levofloxacin (LEVAQUIN) 750 MG  tablet Take 750 mg by mouth daily.    Marland Kitchen loratadine (CLARITIN) 10 MG tablet Take 10 mg by mouth every morning.     . metoprolol succinate (TOPROL-XL) 25 MG 24 hr tablet Take 1 tablet (25 mg total) by mouth daily. 90 tablet 1  . nitroGLYCERIN (NITROSTAT) 0.4 MG SL tablet Place 1 tablet (0.4 mg total) under the tongue every 5 (five) minutes x 3 doses as needed for chest pain. 25 tablet 12  . ranitidine (ZANTAC) 300 MG capsule Take 1 capsule (300 mg total) by mouth 2 (two) times daily. This is the correct rx, d/c qd rx 60 capsule 11  . simethicone (MYLICON) 0000000 MG chewable tablet TAKE PER BOX FOR GAS    . simvastatin (ZOCOR) 20 MG tablet TAKE ONE TABLET BY MOUTH ONCE DAILY AT  6  PM 90 tablet 3  . sodium bicarbonate 650 MG tablet Take 2 tablets (1,300 mg total) by mouth 2 (two) times daily. NEED OV. 120 tablet 1  . SYMBICORT 160-4.5 MCG/ACT inhaler INHALE TWO PUFFS BY MOUTH TWICE DAILY 3 Inhaler 3  . tamsulosin (FLOMAX) 0.4 MG CAPS capsule TAKE ONE CAPSULE BY MOUTH ONCE DAILY IN THE EVENING 30 capsule 5  . aluminum-magnesium hydroxide-simethicone (MAALOX) I037812 MG/5ML SUSP Take by mouth. TAKE PER BOTTLE AS NEEDED FOR EXTRA HEARTBURN    . Cyclophosphamide 50 MG CAPS Take 1 capsule (50 mg total) by mouth daily. Take on an empty stomach  1 hour before or 2 hours after meals.  Currently stopped in the hospital. Please follow-up with your PCP or Pulmonologist regarding decision to resume. (Patient taking differently: Take 25 mg by mouth daily. )     No facility-administered medications prior to visit.    Past Medical History:  Diagnosis Date  . Adenomatous colon polyp   . Anemia   . Asthma   . BPH (benign prostatic hyperplasia)    sees Dr. Risa Grill, biopsy June 2015 was benign   . Bronchiectasis ([place])   . CAD (coronary artery disease)    a. [place] s/p [place] x3 and ultimately BMS to pLAD& POBA to CTO of circumflex/marginal vessel on 03/17/14 and staged BMS to [facility] on 03/21/14  . Cataract   .  Diverticulosis   . Femoral artery pseudo-aneurysm, right ([place])    a. s/p repair. Follow up with Dr. Kellie Simmering   . Fungal infection    lungs  . GERD (gastroesophageal reflux disease)   . Hiatal hernia   . HOH (hard of hearing)    bilaterally  . HTN (hypertension) 09/28/2013  . On home oxygen therapy    uses 2 l/m nasally at bedtime  . Peptic stricture of esophagus   . Wegener's granulomatosis ([place])    sees Dr. [name]    Past Surgical History:  Procedure Laterality Date  . CARDIAC CATHETERIZATION  03/17/2014   Procedure: CORONARY BALLOON ANGIOPLASTY;  Surgeon: Troy Sine, MD;  Location: [facility] CATH LAB;  Service: Cardiovascular;;  . CATARACT EXTRACTION, BILATERAL  12-18-12   bilateral  . CHOLECYSTECTOMY N/A 12/21/2012   Procedure: LAPAROSCOPIC CHOLECYSTECTOMY WITH INTRAOPERATIVE CHOLANGIOGRAM;  Surgeon: Edward Jolly, MD;  Location: [facility] ORS;  Service: General;  Laterality: N/A;  . COLONOSCOPY  08-16-11   per Dr. Fuller Plan, diverticulosis and polyps, repeat in 5 yrs   . CORONARY ANGIOPLASTY  03/2014  . ESOPHAGOGASTRODUODENOSCOPY (EGD) WITH ESOPHAGEAL DILATION  11-29-10   per Dr. Fuller Plan   . EYE SURGERY     cataracts removed.   Marland Kitchen HEMATOMA EVACUATION Right 03/19/2014   Procedure: Suture repair of femoral artery with evacuation of hematoma;  Surgeon: Mal Misty, MD;  Location: [facility];  Service: Vascular;  Laterality: Right;  . HERNIA REPAIR    . LEFT HEART CATHETERIZATION WITH CORONARY ANGIOGRAM N/A 03/14/2014   Procedure: LEFT HEART CATHETERIZATION WITH CORONARY ANGIOGRAM;  Surgeon: Leonie Man, MD;  Location: [facility] CATH LAB;  Service: Cardiovascular;  Laterality: N/A;  . PERCUTANEOUS CORONARY STENT INTERVENTION (PCI-S) N/A 03/17/2014   Procedure: PERCUTANEOUS CORONARY STENT INTERVENTION (PCI-S);  Surgeon: Troy Sine, MD;  Location: [facility] CATH LAB;  Service: Cardiovascular;  Laterality: N/A;  . PERCUTANEOUS CORONARY STENT INTERVENTION (PCI-S) N/A 03/21/2014   Procedure: PERCUTANEOUS CORONARY  STENT INTERVENTION (PCI-S);  Surgeon: Burnell Blanks, MD;  Location: [facility] CATH LAB;  Service: Cardiovascular;  Laterality: N/A;  . RENAL BIOPSY     Social History   Social History  . Marital status: Married    Spouse name: N/A  . Number of children: N/A  . Years of education: N/A   Occupational History  . Retired    Social History Main Topics  . Smoking status: Never Smoker  . Smokeless tobacco: Never Used  . Alcohol use No  . Drug use: No  . Sexual activity: Yes   Other Topics Concern  . None   Social History Narrative  . None   Family History  Problem Relation Age of Onset  . Asthma Father   .  Coronary artery disease Mother   . Coronary artery disease Brother   . Coronary artery disease Brother   . Colon cancer Neg Hx   . Stomach cancer Neg Hx       Physical Exam: General: Well developed, well nourished, chronically ill appearing, no acute distress Head: Normocephalic and atraumatic Eyes:  sclerae anicteric, EOMI Ears: Normal auditory acuity Mouth: No deformity or lesions Lungs: Clear throughout to auscultation Heart: Regular rate and rhythm; 3/6 harsh systolic murmurs, rubs or bruits Abdomen: Soft, non tender and non distended. No masses, hepatosplenomegaly or hernias noted. Normal Bowel sounds Musculoskeletal: Symmetrical with no gross deformities  Pulses:  Normal pulses noted Extremities: No clubbing, cyanosis, edema or deformities noted Neurological: Alert oriented x 4, grossly nonfocal Psychological:  Alert and cooperative. Normal mood and affect  Assessment and Recommendations:  1. Chronic anemia secondary to CKD. Hemoccult positive stool is likely secondary to erosive gastritis or possibly epistaxis. He is maintained on ASA, Plavix and corticosteroids increasing his risk for gastritis, ulcers and occult or overt GI bleeding. His nephrologist, Dr. Jimmy Footman, stopped his PPI over one year ago. I would prefer a PPI however will continue ranitidine  300 mg po bid. He is at a higher risk for procedure and sedation related complications due to his comorbidities so no plans for colonoscopy or EGD at this time. On ASA and Plavix and ideally stopping either or both would be best for his GI tract however unlikely this will be changed by his cardiologist.  2. GERD with a history of an esophageal stricture and erosive gastritis. Avoid all aspirin and NSAIDs. As above, I would prefer a PPI long term. Continue ranitidine 300 mg po bid.   3. Wegener's granulomatosis on immunosuppressive therapy.  4. Bronchiectasis.  5. CKD4.  6. Coronary artery disease maintained on Plavix and ASA.   7. Ischemic cardiomyopathy.  8. Suspected AS. Echocardiogram is planned.

## 2016-07-28 LAB — RESPIRATORY CULTURE OR RESPIRATORY AND SPUTUM CULTURE
GRAM STAIN: NONE SEEN
Gram Stain: NONE SEEN

## 2016-07-29 ENCOUNTER — Telehealth: Payer: Self-pay | Admitting: Internal Medicine

## 2016-07-29 NOTE — Progress Notes (Signed)
LMTCB

## 2016-07-29 NOTE — Telephone Encounter (Signed)
Sputum cx shows Pseudomas - bacteria germ in sputum that needs a different kind of abx  Recommend Cipro 750mg  Twice daily For 10 days - take with food /eat yogurt daily and begin probiotic .daily  Will need ov with Dr. [name] In 2-3 weeks and As needed  Please contact office for sooner follow up if symptoms do not improve or worsen or seek emergency   lmomtcb x 1 for pt.

## 2016-07-30 ENCOUNTER — Inpatient Hospital Stay (HOSPITAL_COMMUNITY)
Admission: EM | Admit: 2016-07-30 | Discharge: 2016-08-02 | DRG: 378 | Disposition: A | Discharge status: Home / Self Care | Payer: Medicare Other | Attending: Internal Medicine | Admitting: Internal Medicine

## 2016-07-30 ENCOUNTER — Emergency Department (HOSPITAL_COMMUNITY): Payer: Medicare Other

## 2016-07-30 DIAGNOSIS — I2510 Atherosclerotic heart disease of native coronary artery without angina pectoris: Secondary | ICD-10-CM | POA: Diagnosis present

## 2016-07-30 DIAGNOSIS — J9610 Chronic respiratory failure, unspecified whether with hypoxia or hypercapnia: Secondary | ICD-10-CM | POA: Diagnosis present

## 2016-07-30 DIAGNOSIS — K921 Melena: Principal | ICD-10-CM | POA: Diagnosis present

## 2016-07-30 DIAGNOSIS — K449 Diaphragmatic hernia without obstruction or gangrene: Secondary | ICD-10-CM | POA: Diagnosis present

## 2016-07-30 DIAGNOSIS — K222 Esophageal obstruction: Secondary | ICD-10-CM | POA: Diagnosis present

## 2016-07-30 DIAGNOSIS — M3130 Wegener's granulomatosis without renal involvement: Secondary | ICD-10-CM | POA: Diagnosis present

## 2016-07-30 DIAGNOSIS — Z9981 Dependence on supplemental oxygen: Secondary | ICD-10-CM

## 2016-07-30 DIAGNOSIS — K922 Gastrointestinal hemorrhage, unspecified: Secondary | ICD-10-CM

## 2016-07-30 DIAGNOSIS — Z79891 Long term (current) use of opiate analgesic: Secondary | ICD-10-CM

## 2016-07-30 DIAGNOSIS — I350 Nonrheumatic aortic (valve) stenosis: Secondary | ICD-10-CM

## 2016-07-30 DIAGNOSIS — Y838 Other surgical procedures as the cause of abnormal reaction of the patient, or of later complication, without mention of misadventure at the time of the procedure: Secondary | ICD-10-CM | POA: Diagnosis not present

## 2016-07-30 DIAGNOSIS — H9190 Unspecified hearing loss, unspecified ear: Secondary | ICD-10-CM | POA: Diagnosis present

## 2016-07-30 DIAGNOSIS — Z7982 Long term (current) use of aspirin: Secondary | ICD-10-CM | POA: Diagnosis not present

## 2016-07-30 DIAGNOSIS — I10 Essential (primary) hypertension: Secondary | ICD-10-CM | POA: Diagnosis present

## 2016-07-30 DIAGNOSIS — I130 Hypertensive heart and chronic kidney disease with heart failure and stage 1 through stage 4 chronic kidney disease, or unspecified chronic kidney disease: Secondary | ICD-10-CM | POA: Diagnosis present

## 2016-07-30 DIAGNOSIS — J45909 Unspecified asthma, uncomplicated: Secondary | ICD-10-CM | POA: Diagnosis present

## 2016-07-30 DIAGNOSIS — N184 Chronic kidney disease, stage 4 (severe): Secondary | ICD-10-CM | POA: Diagnosis present

## 2016-07-30 DIAGNOSIS — Z8249 Family history of ischemic heart disease and other diseases of the circulatory system: Secondary | ICD-10-CM

## 2016-07-30 DIAGNOSIS — N400 Enlarged prostate without lower urinary tract symptoms: Secondary | ICD-10-CM | POA: Diagnosis present

## 2016-07-30 DIAGNOSIS — D649 Anemia, unspecified: Secondary | ICD-10-CM | POA: Diagnosis present

## 2016-07-30 DIAGNOSIS — D509 Iron deficiency anemia, unspecified: Secondary | ICD-10-CM | POA: Diagnosis present

## 2016-07-30 DIAGNOSIS — K9161 Intraoperative hemorrhage and hematoma of a digestive system organ or structure complicating a digestive sytem procedure: Secondary | ICD-10-CM | POA: Diagnosis not present

## 2016-07-30 DIAGNOSIS — K5730 Diverticulosis of large intestine without perforation or abscess without bleeding: Secondary | ICD-10-CM | POA: Diagnosis present

## 2016-07-30 DIAGNOSIS — Z9861 Coronary angioplasty status: Secondary | ICD-10-CM

## 2016-07-30 DIAGNOSIS — Z79899 Other long term (current) drug therapy: Secondary | ICD-10-CM

## 2016-07-30 DIAGNOSIS — J209 Acute bronchitis, unspecified: Secondary | ICD-10-CM | POA: Diagnosis present

## 2016-07-30 DIAGNOSIS — R195 Other fecal abnormalities: Secondary | ICD-10-CM | POA: Diagnosis present

## 2016-07-30 DIAGNOSIS — K5521 Angiodysplasia of colon with hemorrhage: Secondary | ICD-10-CM

## 2016-07-30 DIAGNOSIS — J9611 Chronic respiratory failure with hypoxia: Secondary | ICD-10-CM

## 2016-07-30 DIAGNOSIS — Z7902 Long term (current) use of antithrombotics/antiplatelets: Secondary | ICD-10-CM

## 2016-07-30 DIAGNOSIS — J470 Bronchiectasis with acute lower respiratory infection: Secondary | ICD-10-CM | POA: Diagnosis present

## 2016-07-30 DIAGNOSIS — K219 Gastro-esophageal reflux disease without esophagitis: Secondary | ICD-10-CM | POA: Diagnosis present

## 2016-07-30 DIAGNOSIS — Q2733 Arteriovenous malformation of digestive system vessel: Secondary | ICD-10-CM | POA: Diagnosis not present

## 2016-07-30 DIAGNOSIS — J479 Bronchiectasis, uncomplicated: Secondary | ICD-10-CM | POA: Diagnosis present

## 2016-07-30 DIAGNOSIS — Y92234 Operating room of hospital as the place of occurrence of the external cause: Secondary | ICD-10-CM | POA: Diagnosis not present

## 2016-07-30 DIAGNOSIS — K5520 Angiodysplasia of colon without hemorrhage: Secondary | ICD-10-CM | POA: Diagnosis present

## 2016-07-30 DIAGNOSIS — K635 Polyp of colon: Secondary | ICD-10-CM | POA: Diagnosis present

## 2016-07-30 LAB — COMPREHENSIVE METABOLIC PANEL
ALK PHOS: 63 U/L (ref 38–126)
ALT: 13 U/L — ABNORMAL LOW (ref 17–63)
ANION GAP: 7 (ref 5–15)
AST: 16 U/L (ref 15–41)
Albumin: 3.1 g/dL — ABNORMAL LOW (ref 3.5–5.0)
BILIRUBIN TOTAL: 0.5 mg/dL (ref 0.3–1.2)
BUN: 36 mg/dL — ABNORMAL HIGH (ref 6–20)
CALCIUM: 8.2 mg/dL — AB (ref 8.9–10.3)
CO2: 25 mmol/L (ref 22–32)
Chloride: 112 mmol/L — ABNORMAL HIGH (ref 101–111)
Creatinine, Ser: 2 mg/dL — ABNORMAL HIGH (ref 0.61–1.24)
GFR calc non Af Amer: 30 mL/min — ABNORMAL LOW (ref >60)
GFR, EST AFRICAN AMERICAN: 35 mL/min — AB (ref >60)
GLUCOSE: 105 mg/dL — AB (ref 65–99)
Potassium: 4.2 mmol/L (ref 3.5–5.1)
Sodium: 144 mmol/L (ref 135–145)
Total Protein: 5.2 g/dL — ABNORMAL LOW (ref 6.5–8.1)

## 2016-07-30 LAB — I-STAT TROPONIN, ED
TROPONIN I, POC: 0.01 ng/mL (ref 0.00–0.08)
Troponin i, poc: 0.02 ng/mL (ref 0.00–0.08)

## 2016-07-30 LAB — CBC WITH DIFFERENTIAL/PLATELET
Basophils Absolute: 0 K/uL (ref 0.0–0.1)
Basophils Relative: 0 %
Eosinophils Absolute: 0.1 K/uL (ref 0.0–0.7)
Eosinophils Relative: 1 %
HCT: 21.2 % — ABNORMAL LOW (ref 39.0–52.0)
Hemoglobin: 6.8 g/dL — CL (ref 13.0–17.0)
Lymphocytes Relative: 5 %
Lymphs Abs: 0.3 K/uL — ABNORMAL LOW (ref 0.7–4.0)
MCH: 36.8 pg — ABNORMAL HIGH (ref 26.0–34.0)
MCHC: 32.1 g/dL (ref 30.0–36.0)
MCV: 114.6 fL — ABNORMAL HIGH (ref 78.0–100.0)
Monocytes Absolute: 0.4 K/uL (ref 0.1–1.0)
Monocytes Relative: 7 %
Neutro Abs: 4.5 K/uL (ref 1.7–7.7)
Neutrophils Relative %: 87 %
Platelets: 153 K/uL (ref 150–400)
RBC: 1.85 MIL/uL — ABNORMAL LOW (ref 4.22–5.81)
RDW: 15.1 % (ref 11.5–15.5)
WBC: 5.3 K/uL (ref 4.0–10.5)

## 2016-07-30 LAB — BRAIN NATRIURETIC PEPTIDE: B Natriuretic Peptide: 287.4 pg/mL — ABNORMAL HIGH (ref 0.0–100.0)

## 2016-07-30 LAB — CBC
HCT: 20.7 % — ABNORMAL LOW (ref 39.0–52.0)
HCT: 20.3 % — ABNORMAL LOW (ref 39.0–52.0)
HEMOGLOBIN: 6.8 g/dL — AB (ref 13.0–17.0)
Hemoglobin: 6.6 g/dL — CL (ref 13.0–17.0)
MCH: 37.2 pg — ABNORMAL HIGH (ref 26.0–34.0)
MCH: 36.9 pg — ABNORMAL HIGH (ref 26.0–34.0)
MCHC: 32.9 g/dL (ref 30.0–36.0)
MCHC: 32.5 g/dL (ref 30.0–36.0)
MCV: 113.1 fL — ABNORMAL HIGH (ref 78.0–100.0)
MCV: 113.4 fL — AB (ref 78.0–100.0)
PLATELETS: 153 10*3/uL (ref 150–400)
PLATELETS: 155 10*3/uL (ref 150–400)
RBC: 1.83 MIL/uL — ABNORMAL LOW (ref 4.22–5.81)
RBC: 1.79 MIL/uL — AB (ref 4.22–5.81)
RDW: 15 % (ref 11.5–15.5)
RDW: 14.9 % (ref 11.5–15.5)
WBC: 4.7 10*3/uL (ref 4.0–10.5)
WBC: 4.2 10*3/uL (ref 4.0–10.5)

## 2016-07-30 LAB — I-STAT CG4 LACTIC ACID, ED
LACTIC ACID, VENOUS: 0.61 mmol/L (ref 0.5–1.9)
Lactic Acid, Venous: 0.95 mmol/L (ref 0.5–1.9)

## 2016-07-30 LAB — POC OCCULT BLOOD, ED: Fecal Occult Bld: POSITIVE — AB

## 2016-07-30 LAB — PREPARE RBC (CROSSMATCH)

## 2016-07-30 LAB — ABO/RH: ABO/RH(D): O POS

## 2016-07-30 MED ORDER — METOPROLOL TARTRATE 5 MG/5ML IV SOLN
2.5000 mg | Freq: Every evening | INTRAVENOUS | Status: AC
  Administered 2016-07-30 – 2016-08-01 (×3): 2.5 mg via INTRAVENOUS
  Filled 2016-07-30 (×3): qty 5

## 2016-07-30 MED ORDER — SODIUM CHLORIDE 0.9 % IV SOLN
Freq: Once | INTRAVENOUS | Status: AC
  Administered 2016-07-30: via INTRAVENOUS

## 2016-07-30 MED ORDER — SODIUM CHLORIDE 0.9 % IV SOLN
INTRAVENOUS | Status: DC
  Administered 2016-07-30: 21:00:00 via INTRAVENOUS

## 2016-07-30 MED ORDER — IPRATROPIUM-ALBUTEROL 0.5-2.5 (3) MG/3ML IN SOLN: 3.0000 mL | RESPIRATORY_TRACT | Status: DC | PRN

## 2016-07-30 MED ORDER — PANTOPRAZOLE SODIUM 40 MG IV SOLR
40.0000 mg | Freq: Once | INTRAVENOUS | Status: AC
  Administered 2016-07-30: 40 mg via INTRAVENOUS
  Filled 2016-07-30: qty 40

## 2016-07-30 NOTE — ED Notes (Signed)
Assumed care of patient from [facility]

## 2016-07-30 NOTE — H&P (Signed)
History and Physical    ULMER EREKSON [id number] DOB: 21-Oct-1935 DOA: 07/30/2016  PCP: Alysia Penna, MD  Patient coming from: Home  Chief Complaint: Weakness, dyspnea on exertion  HPI: Richard [name] is a 80 y.o. 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  Patient states that he had congestion and was producing yellow sputum slightly over a week ago.  He was given antibiotics to take if this happens and he started taking amoxicillin as he normally does when this happens.  He took this up until 2 days ago and he reported no improvement.  In fact he reports a worsening of dyspnea where he could not longer barely stand without getting short of breath.  This morning he went to the drug store and upon returning couldn't catch his breath.  It was at that time he decided to come to the ED.    He reports black tarry stools since taking the Amoxicillin and reports that it has never done that previously.  He has been getting weekly injections for anemia for the past year and received two shots 5 days prior to admission.  No chest pain, chest pressure, vomiting, dysuria, hematuria.  Patient has been having about 2 bowel movements every morning and reports all bowel movements have been black and tarry for the past couple of days.  Occasional abdominal pain located in the epigastric area and then in the lower rectal area after bowel movements.  Lives at home with his wife- able to ambulate and perform ADL's independently.  ED Course: Patient was found to have a hemoglobin of 5.5.  Hemoccult positive.  Blood transfusion was ordered.  CT chest without contrast was negative for pneumonia.   Review of Systems: As per HPI otherwise 10 point review of systems negative.    Past Medical History:  Diagnosis Date  . Adenomatous colon polyp   . Anemia   . Asthma   . BPH (benign prostatic hyperplasia)    sees Dr. Risa Grill, biopsy June 2015  was benign   . Bronchiectasis ([place])   . CAD (coronary artery disease)    a. [place] s/p [place] x3 and ultimately BMS to pLAD& POBA to CTO of circumflex/marginal vessel on 03/17/14 and staged BMS to [facility] on 03/21/14  . Cataract   . Diverticulosis   . Femoral artery pseudo-aneurysm, right ([place])    a. s/p repair. Follow up with Dr. Kellie Simmering   . Fungal infection    lungs  . GERD (gastroesophageal reflux disease)   . Hiatal hernia   . HOH (hard of hearing)    bilaterally  . HTN (hypertension) 09/28/2013  . On home oxygen therapy    uses 2 l/m nasally at bedtime  . Peptic stricture of esophagus   . Wegener's granulomatosis ([place])    sees Dr. [name]     Past Surgical History:  Procedure Laterality Date  . CARDIAC CATHETERIZATION  03/17/2014   Procedure: CORONARY BALLOON ANGIOPLASTY;  Surgeon: Troy Sine, MD;  Location: [facility] CATH LAB;  Service: Cardiovascular;;  . CATARACT EXTRACTION, BILATERAL  12-18-12   bilateral  . CHOLECYSTECTOMY N/A 12/21/2012   Procedure: LAPAROSCOPIC CHOLECYSTECTOMY WITH INTRAOPERATIVE CHOLANGIOGRAM;  Surgeon: Edward Jolly, MD;  Location: [facility] ORS;  Service: General;  Laterality: N/A;  . COLONOSCOPY  08-16-11   per Dr. Fuller Plan, diverticulosis and polyps, repeat in 5 yrs   . CORONARY ANGIOPLASTY  03/2014  . ESOPHAGOGASTRODUODENOSCOPY (EGD) WITH ESOPHAGEAL DILATION  11-29-10  per Dr. Fuller Plan   . EYE SURGERY     cataracts removed.   Marland Kitchen HEMATOMA EVACUATION Right 03/19/2014   Procedure: Suture repair of femoral artery with evacuation of hematoma;  Surgeon: Mal Misty, MD;  Location: [place];  Service: Vascular;  Laterality: Right;  . HERNIA REPAIR    . LEFT HEART CATHETERIZATION WITH CORONARY ANGIOGRAM N/A 03/14/2014   Procedure: LEFT HEART CATHETERIZATION WITH CORONARY ANGIOGRAM;  Surgeon: Leonie Man, MD;  Location: [facility] CATH LAB;  Service: Cardiovascular;  Laterality: N/A;  . PERCUTANEOUS CORONARY STENT INTERVENTION (PCI-S) N/A 03/17/2014   Procedure: PERCUTANEOUS CORONARY  STENT INTERVENTION (PCI-S);  Surgeon: Troy Sine, MD;  Location: [facility] CATH LAB;  Service: Cardiovascular;  Laterality: N/A;  . PERCUTANEOUS CORONARY STENT INTERVENTION (PCI-S) N/A 03/21/2014   Procedure: PERCUTANEOUS CORONARY STENT INTERVENTION (PCI-S);  Surgeon: Burnell Blanks, MD;  Location: [facility] CATH LAB;  Service: Cardiovascular;  Laterality: N/A;  . RENAL BIOPSY       reports that he has never smoked. He has never used smokeless tobacco. He reports that he does not drink alcohol or use drugs.  No Known Allergies  Family History  Problem Relation Age of Onset  . Asthma Father   . Coronary artery disease Mother   . Coronary artery disease Brother   . Coronary artery disease Brother   . Colon cancer Neg Hx   . Stomach cancer Neg Hx      Prior to Admission medications   Medication Sig Start Date End Date Taking? Authorizing Provider  Acetaminophen (TYLENOL) 325 MG CAPS Take 325 mg by mouth every 6 (six) hours as needed (pain). TAKE PER BOX AS NEEDED FOR PAIN    Yes Historical Provider, MD  albuterol (VENTOLIN HFA) 108 (90 Base) MCG/ACT inhaler Inhale 1-2 puffs into the lungs every 4 (four) hours as needed for wheezing or shortness of breath. 07/12/16  Yes Tanda Rockers, MD  aspirin EC 81 MG tablet Take 81 mg by mouth daily.    Yes Historical Provider, MD  b complex vitamins tablet Take 1 tablet by mouth daily.     Yes Historical Provider, MD  bisacodyl (DULCOLAX) 5 MG EC tablet Take 5 mg by mouth 2 (two) times daily.    Yes Historical Provider, MD  calcitRIOL (ROCALTROL) 0.25 MCG capsule Take 0.25 mcg by mouth daily.  09/06/15  Yes Historical Provider, MD  clopidogrel (PLAVIX) 75 MG tablet TAKE ONE TABLET BY MOUTH ONCE DAILY Patient taking differently: Take 75 mg by mouth daily. TAKE ONE TABLET BY MOUTH ONCE DAILY 07/07/16  Yes Pixie Casino, MD  cyclophosphamide (CYTOXAN) 25 MG capsule Take 25 mg by mouth daily. Take one capsule by mouth daily on an empty stomach 1 hour  before or 2 hours after meal. 07/26/16  Yes Historical Provider, MD  dextromethorphan-guaiFENesin (MUCINEX DM) 30-600 MG 12hr tablet Take 1 tablet by mouth 2 (two) times daily.   Yes Historical Provider, MD  docusate sodium (COLACE) 100 MG capsule Take 100 mg by mouth 2 (two) times daily.   Yes Historical Provider, MD  finasteride (PROSCAR) 5 MG tablet TAKE ONE TABLET BY MOUTH ONCE DAILY 05/25/16  Yes [name], MD  ipratropium-albuterol (DUONEB) 0.5-2.5 (3) MG/3ML SOLN Take 3 mLs by nebulization every 4 (four) hours as needed (if inhaler is not effective in resolving shortness of breath or wheezing). Patient taking differently: Take 3 mLs by nebulization every 4 (four) hours as needed (WITH FLUTTER VALVE FOR WHEEZING, SHORTNESS OF  BREATH-PLAN C).  02/03/16  Yes Tanda Rockers, MD  loratadine (CLARITIN) 10 MG tablet Take 10 mg by mouth daily.    Yes Historical Provider, MD  metoprolol succinate (TOPROL-XL) 25 MG 24 hr tablet Take 1 tablet (25 mg total) by mouth daily. 07/18/16  Yes Pixie Casino, MD  nitroGLYCERIN (NITROSTAT) 0.4 MG SL tablet Place 1 tablet (0.4 mg total) under the tongue every 5 (five) minutes x 3 doses as needed for chest pain. 03/24/15  Yes Pixie Casino, MD  predniSONE (DELTASONE) 5 MG tablet Take 7.5 mg by mouth daily with breakfast.   Yes Historical Provider, MD  ranitidine (ZANTAC) 300 MG capsule Take 1 capsule (300 mg total) by mouth 2 (two) times daily. This is the correct rx, d/c qd rx 04/04/16  Yes Ladene Artist, MD  simethicone (MYLICON) 0000000 MG chewable tablet Chew 125 mg by mouth every 6 (six) hours as needed for flatulence. TAKE PER BOX FOR GAS    Yes Historical Provider, MD  simvastatin (ZOCOR) 20 MG tablet TAKE ONE TABLET BY MOUTH ONCE DAILY AT  6  PM 07/07/16  Yes Pixie Casino, MD  sodium bicarbonate 650 MG tablet Take 2 tablets (1,300 mg total) by mouth 2 (two) times daily. NEED OV. 08/12/15  Yes Pixie Casino, MD  SYMBICORT 160-4.5 MCG/ACT inhaler INHALE  TWO PUFFS BY MOUTH TWICE DAILY 06/01/16  Yes Tanda Rockers, MD  tamsulosin (FLOMAX) 0.4 MG CAPS capsule TAKE ONE CAPSULE BY MOUTH ONCE DAILY IN THE EVENING 07/05/16  Yes [name], MD  HYDROcodone-homatropine (HYDROMET) 5-1.5 MG/5ML syrup Take 5 mLs by mouth every 4 (four) hours as needed. 11/24/14   [name], MD  levofloxacin (LEVAQUIN) 750 MG tablet Take 750 mg by mouth daily.    Historical Provider, MD    Physical Exam: Vitals:   07/30/16 1328 07/30/16 1358  BP: 110/88   Pulse: 84   Resp: 16   Temp: 98.4 F (36.9 C)   SpO2: 97%   Weight:  55.3 kg (122 lb)  Height:  5\' 7"  (1.702 m)      Constitutional: NAD, calm, comfortable Vitals:   07/30/16 1328 07/30/16 1358  BP: 110/88   Pulse: 84   Resp: 16   Temp: 98.4 F (36.9 C)   SpO2: 97%   Weight:  55.3 kg (122 lb)  Height:  5\' 7"  (1.702 m)   Eyes: PERRL, lids and conjunctivae normal ENMT: Mucous membranes are moist. Posterior pharynx clear of any exudate or lesions.Normal dentition.  Neck: normal, supple, no masses, no thyromegaly Respiratory: few expiratory wheezes heard mostly at bases bilaterally, no crackles. Normal respiratory effort. No accessory muscle use.  Cardiovascular: Regular rate and rhythm,  III/VI systolic murmur heard best at aortic area, no rubs / gallops. No extremity edema. 2+ pedal pulses. No carotid bruits. Slight JVP Abdomen: no tenderness, no masses palpated. No hepatosplenomegaly. Bowel sounds positive.  Musculoskeletal: no clubbing / cyanosis. No joint deformity upper and lower extremities. Good ROM, no contractures. Normal muscle tone.  Skin: no lesions, ulcers. No induration.  Darkening of the posterior forearms bilaterally. Neurologic: CN 2-12 grossly intact. Sensation intact, DTR normal. Strength 5/5 in all 4.  Psychiatric: Normal judgment and insight. Alert and oriented x 3. Normal mood.    Labs on Admission: I have personally reviewed following labs and imaging  studies  CBC:  Recent Labs Lab 07/26/16 1206 07/30/16 1455  WBC  --  5.3  NEUTROABS  --  4.5  HGB 8.4* 6.8*  HCT  --  21.2*  MCV  --  114.6*  PLT  --  0000000   Basic Metabolic Panel:  Recent Labs Lab 07/30/16 1455  NA 144  K 4.2  CL 112*  CO2 25  GLUCOSE 105*  BUN 36*  CREATININE 2.00*  CALCIUM 8.2*   GFR: Estimated Creatinine Clearance: 23.8 mL/min (by C-G formula based on SCr of 2 mg/dL (H)). Liver Function Tests:  Recent Labs Lab 07/30/16 1455  AST 16  ALT 13*  ALKPHOS 63  BILITOT 0.5  PROT 5.2*  ALBUMIN 3.1*   No results for input(s): LIPASE, AMYLASE in the last 168 hours. No results for input(s): AMMONIA in the last 168 hours. Coagulation Profile: No results for input(s): INR, PROTIME in the last 168 hours. Cardiac Enzymes: No results for input(s): CKTOTAL, CKMB, CKMBINDEX, TROPONINI in the last 168 hours. BNP (last 3 results) No results for input(s): PROBNP in the last 8760 hours. HbA1C: No results for input(s): HGBA1C in the last 72 hours. CBG: No results for input(s): GLUCAP in the last 168 hours. Lipid Profile: No results for input(s): CHOL, HDL, LDLCALC, TRIG, CHOLHDL, LDLDIRECT in the last 72 hours. Thyroid Function Tests: No results for input(s): TSH, T4TOTAL, FREET4, T3FREE, THYROIDAB in the last 72 hours. Anemia Panel: No results for input(s): VITAMINB12, FOLATE, FERRITIN, TIBC, IRON, RETICCTPCT in the last 72 hours. Urine analysis:    Component Value Date/Time   COLORURINE YELLOW 01/07/2016 [place] 01/07/2016 1541   LABSPEC 1.017 01/07/2016 1541   PHURINE 7.0 01/07/2016 1541   GLUCOSEU NEGATIVE 01/07/2016 1541   GLUCOSEU NEGATIVE 07/26/2013 1142   HGBUR NEGATIVE 01/07/2016 1541   HGBUR trace-intact 11/13/2008 0000   BILIRUBINUR NEGATIVE 01/07/2016 1541   BILIRUBINUR neg 05/16/2012 1300   KETONESUR NEGATIVE 01/07/2016 1541   PROTEINUR NEGATIVE 01/07/2016 1541   UROBILINOGEN 0.2 03/25/2014 1637   NITRITE NEGATIVE  01/07/2016 1541   LEUKOCYTESUR NEGATIVE 01/07/2016 1541   Sepsis Labs: !!!!!!!!!!!!!!!!!!!!!!!!!!!!!!!!!!!!!!!!!!!! @LABRCNTIP (procalcitonin:4,lacticidven:4) ) Recent Results (from the past 240 hour(s))  Respiratory or Resp and Sputum Culture     Status: None   Collection Time: 07/22/16 12:48 PM  Result Value Ref Range Status   Culture Moderate PSEUDOMONAS AERUGINOSA  Final   Gram Stain No WBC Seen  Final   Gram Stain No Squamous Epithelial Cells Seen  Final   Gram Stain Moderate Gram Negative Rods  Final   Organism ID, Bacteria PSEUDOMONAS AERUGINOSA  Final      Susceptibility   Pseudomonas aeruginosa -  (no method available)    PIP/TAZO  Sensitive     TICAR/CLAVULANIC  Intermediate     IMIPENEM  Sensitive     CEFTAZIDIME  Sensitive     CEFEPIME  Sensitive     GENTAMICIN  Sensitive     TOBRAMYCIN  Sensitive     CIPROFLOXACIN  Sensitive   AFB Culture & Smear     Status: None (Preliminary result)   Collection Time: 07/22/16 12:48 PM  Result Value Ref Range Status   Source: ;  Preliminary   Acid Fast Bacilli (AFB) Culture    Preliminary    Comment:   MYCOBACTERIA, CULTURE, WITH FLUOROCHROME SMEAR       MICRO NUMBER:      [id number]   TEST STATUS:       PRELIMINARY   SPECIMEN SOURCE:   SPUTUM   SPECIMEN QUALITY:  ADEQUATE   SMEAR:  No acid fast bacilli seen.   RESULT:            Culture results to follow. Final reports of                      negative cultures can be expected in approximately                      six weeks. Positive cultures are reported                      immediately.      Radiological Exams on Admission: Dg Chest 2 View  Result Date: 07/30/2016 CLINICAL DATA:  Shortness of breath with exertion EXAM: CHEST  2 VIEW COMPARISON:  06/09/2016 FINDINGS: Cardiac shadow is stable. Hiatal hernia is again seen and stable. Patchy fibrotic changes are noted bilaterally stable from the prior exam. Bronchial dilatation consistent with bronchiectasis seen  previously is noted. No acute bony abnormality is seen. IMPRESSION: Chronic changes without acute abnormality. Electronically Signed   By: Inez Catalina M.D.   On: 07/30/2016 15:06   Ct Chest Wo Contrast  Result Date: 07/30/2016 CLINICAL DATA:  Weakness and shortness of Breath EXAM: CT CHEST WITHOUT CONTRAST TECHNIQUE: Multidetector CT imaging of the chest was performed following the standard protocol without IV contrast. COMPARISON:  01/07/2016 FINDINGS: Cardiovascular: Somewhat limited by the lack of IV contrast. Diffuse aortic calcifications are seen without aneurysmal dilatation. Heavy coronary calcifications are seen. The cardiac structures are otherwise within normal limits. Mediastinum/Nodes: Large hiatal hernia is again identified. No significant hilar or mediastinal adenopathy is noted. The thoracic inlet is within normal limits. Lungs/Pleura: Changes of bronchiectasis are again seen in the lower lobes bilaterally. The degree of mucous plugging has improved significantly in the interval from the prior exam. Chronic fibrotic changes are seen bilaterally. Some stable areas of nodularity are noted particularly in the right lower lobe but to a lesser degree in the right upper lobe. These are stable in appearance from the prior exam. Continued follow-up. Upper Abdomen:  No acute abnormality noted. Musculoskeletal: No chest wall mass or suspicious bone lesions identified. IMPRESSION: Changes of bronchiectasis predominately in the lower lobes bilaterally. The degree of mucous plugging has improved from the prior exam. Persistent postinflammatory changes in the right lower lobe and to a lesser degree in right upper lobe stable from the previous exam. No new focal abnormality is seen. Large stable hiatal hernia. Electronically Signed   By: Inez Catalina M.D.   On: 07/30/2016 15:38    EKG: Independently reviewed. NSR  Assessment/Plan Active Problems:   Wegener's granulomatosis (HCC)   Obstructive  bronchiectasis (HCC)   Chronic respiratory failure (HCC)   HBP (high blood pressure)   CAD- severe 4 V CAD- not CABG candidate   Chronic kidney disease, stage IV (severe) (HCC)   Aortic valve stenosis   Symptomatic anemia   GIB (gastrointestinal bleeding)   Symptomatic anemia 2/2 GIB - Gastroenterology consulted - 2 large bore IVs - blood transfusion ordered by ED physician - EGD planned for tomorrow - hemoccult positive - continue PPI - NPO for now - monitor vitals closely   CKD Stage IV 2/2 Wegener's granulomatosis - Cr appears to be at patients baseline (~2) - on bicarb at home, need to hold PO medications at this time - on cytoxan at home again holding all PO medications 2/2 GIB  Obstructive bronchiectasis - patient does not wear oxygen at  any time outpatient - continue to monitor respiratory status  CAD - patient on plavix and aspirin at home - will need to hold these medications for time being - will order IV metoprolol as patient takes metoprolol at home     DVT prophylaxis: SCD's due to GIB Code Status: Full Code Family Communication: Wife is bedside Disposition Plan: Discharge back to previous home environment Consults called:  [facility] called by EDP Admission status: Inpatient to telemetry- patient will likely require <2 midnights in the hospital and risks significant morbidity and mortality without admission to hospital   Loretha Stapler MD [facility] Pager [phone]  If 7PM-7AM, please contact night-coverage [email or web address] Password [facility]  07/30/2016, 5:48 PM

## 2016-07-30 NOTE — ED Provider Notes (Signed)
[place] DEPT Provider Note   CSN: [id number] Arrival date & time: 07/30/16  1315     History   Chief Complaint No chief complaint on file.   HPI Richard [name] is a 80 y.o. male.  HPI  80 yo M with extensive PMHx as below including CAD, CHF, Wegener's, bronchiectasis here with SOB. Pt states that for the past 2-3 weeks he has had progressively worsening SOB with cough, sputum production. He saw his pulmonologist for these sx and has been started on Augmentin, which he often takes when he develops respiratory infections. Over the past several days, however, his SOB and sputum production has worsened. He endorses severe SOB with exertion. He also has mild chest pressure with exertion and coughing. He thought his augmentin was causing his sx so he stopped it 2 days ago. He also notes increasing black, tarry stools with general fatigue. He has seen GI for this in the past and has h/o GIB. No recent epigastric or abdominal pain. No BRBPR. Due to his worsening SOB and sputum production, he presents for evaluation.  Past Medical History:  Diagnosis Date  . Adenomatous colon polyp   . Anemia   . Asthma   . BPH (benign prostatic hyperplasia)    sees Dr. Risa Grill, biopsy June 2015 was benign   . Bronchiectasis ([place])   . CAD (coronary artery disease)    a. [place] s/p [place] x3 and ultimately BMS to pLAD& POBA to CTO of circumflex/marginal vessel on 03/17/14 and staged BMS to [facility] on 03/21/14  . Cataract   . Diverticulosis   . Femoral artery pseudo-aneurysm, right ([place])    a. s/p repair. Follow up with Dr. Kellie Simmering   . Fungal infection    lungs  . GERD (gastroesophageal reflux disease)   . Hiatal hernia   . HOH (hard of hearing)    bilaterally  . HTN (hypertension) 09/28/2013  . On home oxygen therapy    uses 2 l/m nasally at bedtime  . Peptic stricture of esophagus   . Wegener's granulomatosis ([place])    sees Dr. [name]     Patient Active Problem List   Diagnosis Date Noted  .  Symptomatic anemia 07/30/2016  . Aortic valve stenosis 07/08/2016  . Protein-calorie malnutrition, severe ([place]) 04/21/2015  . CAP (community acquired pneumonia) 04/18/2015  . Chronic kidney disease, stage IV (severe) ([place]) 03/26/2015  . Wegener's granulomatosis with renal involvement ([place]) 07/15/2014  . Pseudoaneurysm of right femoral artery ([place]) 04/08/2014  . CAD- severe 4 V CAD- not CABG candidate 03/17/2014  . Anemia- transfused 03/16/14 03/17/2014  . Cardiomyopathy, ischemic- EF 40% by [facility] 03/17/2014  . Unstable Class III Angina 03/13/2014  . Abnormal nuclear stress test: Intermediate risk with moderate region of apical and inferior scar with mild superimposed ischemia; EF 40% 03/12/2014  . Loss of weight 12/12/2013  . Abdominal pain, unspecified site 12/12/2013  . SVT (supraventricular tachycardia) ([place]) 10/04/2013  . SOB (shortness of breath) 10/04/2013  . Weakness generalized 10/04/2013  . HBP (high blood pressure) 09/28/2013  . Chronic rhinitis 09/27/2013  . Chronic respiratory failure ([place]) 12/26/2012  . Bronchiectasis with acute exacerbation ([place]) 09/28/2011  . OTHER DYSPHAGIA 10/25/2010  . Wegener's granulomatosis ([place]) 05/10/2010  . GERD 03/10/2010  . ARTHRITIS, CERVICAL SPINE 03/10/2010  . Hypothyroidism 11/13/2008  . VITAMIN D DEFICIENCY 11/13/2008  . Hyperlipidemia 11/13/2008  . ANEMIA 11/13/2008  . BENIGN PROSTATIC HYPERTROPHY, HX OF 12/04/2007  . Obstructive bronchiectasis ([place]) 01/25/2007    Past Surgical History:  Procedure Laterality Date  . CARDIAC CATHETERIZATION  03/17/2014   Procedure: CORONARY BALLOON ANGIOPLASTY;  Surgeon: Troy Sine, MD;  Location: [facility] CATH LAB;  Service: Cardiovascular;;  . CATARACT EXTRACTION, BILATERAL  12-18-12   bilateral  . CHOLECYSTECTOMY N/A 12/21/2012   Procedure: LAPAROSCOPIC CHOLECYSTECTOMY WITH INTRAOPERATIVE CHOLANGIOGRAM;  Surgeon: Edward Jolly, MD;  Location: [facility] ORS;  Service: General;  Laterality: N/A;  .  COLONOSCOPY  08-16-11   per Dr. Fuller Plan, diverticulosis and polyps, repeat in 5 yrs   . CORONARY ANGIOPLASTY  03/2014  . ESOPHAGOGASTRODUODENOSCOPY (EGD) WITH ESOPHAGEAL DILATION  11-29-10   per Dr. Fuller Plan   . EYE SURGERY     cataracts removed.   Marland Kitchen HEMATOMA EVACUATION Right 03/19/2014   Procedure: Suture repair of femoral artery with evacuation of hematoma;  Surgeon: Mal Misty, MD;  Location: [facility];  Service: Vascular;  Laterality: Right;  . HERNIA REPAIR    . LEFT HEART CATHETERIZATION WITH CORONARY ANGIOGRAM N/A 03/14/2014   Procedure: LEFT HEART CATHETERIZATION WITH CORONARY ANGIOGRAM;  Surgeon: Leonie Man, MD;  Location: [facility] CATH LAB;  Service: Cardiovascular;  Laterality: N/A;  . PERCUTANEOUS CORONARY STENT INTERVENTION (PCI-S) N/A 03/17/2014   Procedure: PERCUTANEOUS CORONARY STENT INTERVENTION (PCI-S);  Surgeon: Troy Sine, MD;  Location: [facility] CATH LAB;  Service: Cardiovascular;  Laterality: N/A;  . PERCUTANEOUS CORONARY STENT INTERVENTION (PCI-S) N/A 03/21/2014   Procedure: PERCUTANEOUS CORONARY STENT INTERVENTION (PCI-S);  Surgeon: Burnell Blanks, MD;  Location: [facility] CATH LAB;  Service: Cardiovascular;  Laterality: N/A;  . RENAL BIOPSY         Home Medications    Prior to Admission medications   Medication Sig Start Date End Date Taking? Authorizing Provider  Acetaminophen (TYLENOL) 325 MG CAPS Take 325 mg by mouth every 6 (six) hours as needed (pain). TAKE PER BOX AS NEEDED FOR PAIN    Yes Historical Provider, MD  albuterol (VENTOLIN HFA) 108 (90 Base) MCG/ACT inhaler Inhale 1-2 puffs into the lungs every 4 (four) hours as needed for wheezing or shortness of breath. 07/12/16  Yes Tanda Rockers, MD  aspirin EC 81 MG tablet Take 81 mg by mouth daily.    Yes Historical Provider, MD  b complex vitamins tablet Take 1 tablet by mouth daily.     Yes Historical Provider, MD  bisacodyl (DULCOLAX) 5 MG EC tablet Take 5 mg by mouth 2 (two) times daily.    Yes Historical Provider, MD   calcitRIOL (ROCALTROL) 0.25 MCG capsule Take 0.25 mcg by mouth daily.  09/06/15  Yes Historical Provider, MD  clopidogrel (PLAVIX) 75 MG tablet TAKE ONE TABLET BY MOUTH ONCE DAILY Patient taking differently: Take 75 mg by mouth daily. TAKE ONE TABLET BY MOUTH ONCE DAILY 07/07/16  Yes Pixie Casino, MD  cyclophosphamide (CYTOXAN) 25 MG capsule Take 25 mg by mouth daily. Take one capsule by mouth daily on an empty stomach 1 hour before or 2 hours after meal. 07/26/16  Yes Historical Provider, MD  dextromethorphan-guaiFENesin (MUCINEX DM) 30-600 MG 12hr tablet Take 1 tablet by mouth 2 (two) times daily.   Yes Historical Provider, MD  docusate sodium (COLACE) 100 MG capsule Take 100 mg by mouth 2 (two) times daily.   Yes Historical Provider, MD  finasteride (PROSCAR) 5 MG tablet TAKE ONE TABLET BY MOUTH ONCE DAILY 05/25/16  Yes [name], MD  ipratropium-albuterol (DUONEB) 0.5-2.5 (3) MG/3ML SOLN Take 3 mLs by nebulization every 4 (four) hours as needed (if inhaler is  not effective in resolving shortness of breath or wheezing). Patient taking differently: Take 3 mLs by nebulization every 4 (four) hours as needed (WITH FLUTTER VALVE FOR WHEEZING, SHORTNESS OF BREATH-PLAN C).  02/03/16  Yes Tanda Rockers, MD  loratadine (CLARITIN) 10 MG tablet Take 10 mg by mouth daily.    Yes Historical Provider, MD  metoprolol succinate (TOPROL-XL) 25 MG 24 hr tablet Take 1 tablet (25 mg total) by mouth daily. 07/18/16  Yes Pixie Casino, MD  nitroGLYCERIN (NITROSTAT) 0.4 MG SL tablet Place 1 tablet (0.4 mg total) under the tongue every 5 (five) minutes x 3 doses as needed for chest pain. 03/24/15  Yes Pixie Casino, MD  predniSONE (DELTASONE) 5 MG tablet Take 7.5 mg by mouth daily with breakfast.   Yes Historical Provider, MD  ranitidine (ZANTAC) 300 MG capsule Take 1 capsule (300 mg total) by mouth 2 (two) times daily. This is the correct rx, d/c qd rx 04/04/16  Yes Ladene Artist, MD  simethicone (MYLICON) 0000000  MG chewable tablet Chew 125 mg by mouth every 6 (six) hours as needed for flatulence. TAKE PER BOX FOR GAS    Yes Historical Provider, MD  simvastatin (ZOCOR) 20 MG tablet TAKE ONE TABLET BY MOUTH ONCE DAILY AT  6  PM 07/07/16  Yes Pixie Casino, MD  sodium bicarbonate 650 MG tablet Take 2 tablets (1,300 mg total) by mouth 2 (two) times daily. NEED OV. 08/12/15  Yes Pixie Casino, MD  SYMBICORT 160-4.5 MCG/ACT inhaler INHALE TWO PUFFS BY MOUTH TWICE DAILY 06/01/16  Yes Tanda Rockers, MD  tamsulosin (FLOMAX) 0.4 MG CAPS capsule TAKE ONE CAPSULE BY MOUTH ONCE DAILY IN THE EVENING 07/05/16  Yes [name], MD    Family History Family History  Problem Relation Age of Onset  . Asthma Father   . Coronary artery disease Mother   . Coronary artery disease Brother   . Coronary artery disease Brother   . Colon cancer Neg Hx   . Stomach cancer Neg Hx     Social History Social History  Substance Use Topics  . Smoking status: Never Smoker  . Smokeless tobacco: Never Used  . Alcohol use No     Allergies   Patient has no known allergies.   Review of Systems Review of Systems  Constitutional: Positive for fatigue. Negative for chills and fever.  HENT: Negative for congestion and rhinorrhea.   Eyes: Negative for visual disturbance.  Respiratory: Positive for cough, chest tightness and shortness of breath. Negative for wheezing.   Cardiovascular: Positive for chest pain. Negative for leg swelling.  Gastrointestinal: Negative for abdominal pain, diarrhea, nausea and vomiting.  Genitourinary: Negative for dysuria and flank pain.  Musculoskeletal: Negative for neck pain and neck stiffness.  Skin: Negative for rash and wound.  Allergic/Immunologic: Negative for immunocompromised state.  Neurological: Negative for syncope, weakness and headaches.  All other systems reviewed and are negative.    Physical Exam Updated Vital Signs BP 110/88   Pulse 84   Temp 98.4 F (36.9 C)   Resp  16   Ht 5\' 7"  (1.702 m)   Wt 122 lb (55.3 kg)   SpO2 97%   BMI 19.11 kg/m   Physical Exam  Constitutional: He is oriented to person, place, and time. He appears well-developed and well-nourished. No distress.  HENT:  Head: Normocephalic and atraumatic.  Mildly dry MM  Eyes: Conjunctivae are normal.  Neck: Neck supple.  Cardiovascular: Normal rate  and regular rhythm.  Exam reveals no friction rub.   Murmur heard.  Crescendo decrescendo systolic murmur is present with a grade of 4/6  Pulmonary/Chest: Effort normal. No respiratory distress. He has no wheezes.  Diffuse rhonchi that clear with coughing, overall good aeration, normal WOB  Abdominal: Soft. Bowel sounds are normal. He exhibits no distension. There is no tenderness. There is no rebound and no guarding.  Genitourinary:  Genitourinary Comments: Melanotic stool in rectal vault. No maroon colored or bright red bloody stools.  Musculoskeletal: He exhibits no edema.  Neurological: He is alert and oriented to person, place, and time. He exhibits normal muscle tone.  Skin: Skin is warm. Capillary refill takes less than 2 seconds.  Psychiatric: He has a normal mood and affect.  Nursing note and vitals reviewed.    ED Treatments / Results  Labs (all labs ordered are listed, but only abnormal results are displayed) Labs Reviewed  CBC WITH DIFFERENTIAL/PLATELET - Abnormal; Notable for the following:       Result Value   RBC 1.85 (*)    Hemoglobin 6.8 (*)    HCT 21.2 (*)    MCV 114.6 (*)    [name] 36.8 (*)    Lymphs Abs 0.3 (*)    All other components within normal limits  COMPREHENSIVE METABOLIC PANEL - Abnormal; Notable for the following:    Chloride 112 (*)    Glucose, Bld 105 (*)    BUN 36 (*)    Creatinine, Ser 2.00 (*)    Calcium 8.2 (*)    Total Protein 5.2 (*)    Albumin 3.1 (*)    ALT 13 (*)    GFR calc non Af Amer 30 (*)    GFR calc Af Amer 35 (*)    All other components within normal limits  BRAIN NATRIURETIC  PEPTIDE - Abnormal; Notable for the following:    B Natriuretic Peptide 287.4 (*)    All other components within normal limits  POC OCCULT BLOOD, ED - Abnormal; Notable for the following:    Fecal Occult Bld POSITIVE (*)    All other components within normal limits  I-STAT TROPOININ, ED  I-STAT CG4 LACTIC ACID, ED  TYPE AND SCREEN  PREPARE RBC (CROSSMATCH)  ABO/RH    EKG  EKG Interpretation  Date/Time:  Saturday July 30 2016 15:35:52 EST Ventricular Rate:  75 PR Interval:    QRS Duration: 94 QT Interval:  412 QTC Calculation: 461 R Axis:   66 Text Interpretation:  Sinus rhythm Prolonged PR interval No significant change since last tracing Confirmed by Ela Moffat MD, Treyson Axel [phone]) on 07/30/2016 4:09:58 PM       Radiology Dg Chest 2 View  Result Date: 07/30/2016 CLINICAL DATA:  Shortness of breath with exertion EXAM: CHEST  2 VIEW COMPARISON:  06/09/2016 FINDINGS: Cardiac shadow is stable. Hiatal hernia is again seen and stable. Patchy fibrotic changes are noted bilaterally stable from the prior exam. Bronchial dilatation consistent with bronchiectasis seen previously is noted. No acute bony abnormality is seen. IMPRESSION: Chronic changes without acute abnormality. Electronically Signed   By: Inez Catalina M.D.   On: 07/30/2016 15:06   Ct Chest Wo Contrast  Result Date: 07/30/2016 CLINICAL DATA:  Weakness and shortness of Breath EXAM: CT CHEST WITHOUT CONTRAST TECHNIQUE: Multidetector CT imaging of the chest was performed following the standard protocol without IV contrast. COMPARISON:  01/07/2016 FINDINGS: Cardiovascular: Somewhat limited by the lack of IV contrast. Diffuse aortic calcifications are seen without aneurysmal  dilatation. Heavy coronary calcifications are seen. The cardiac structures are otherwise within normal limits. Mediastinum/Nodes: Large hiatal hernia is again identified. No significant hilar or mediastinal adenopathy is noted. The thoracic inlet is within  normal limits. Lungs/Pleura: Changes of bronchiectasis are again seen in the lower lobes bilaterally. The degree of mucous plugging has improved significantly in the interval from the prior exam. Chronic fibrotic changes are seen bilaterally. Some stable areas of nodularity are noted particularly in the right lower lobe but to a lesser degree in the right upper lobe. These are stable in appearance from the prior exam. Continued follow-up. Upper Abdomen:  No acute abnormality noted. Musculoskeletal: No chest wall mass or suspicious bone lesions identified. IMPRESSION: Changes of bronchiectasis predominately in the lower lobes bilaterally. The degree of mucous plugging has improved from the prior exam. Persistent postinflammatory changes in the right lower lobe and to a lesser degree in right upper lobe stable from the previous exam. No new focal abnormality is seen. Large stable hiatal hernia. Electronically Signed   By: Inez Catalina M.D.   On: 07/30/2016 15:38    Procedures .Critical Care Performed by: Duffy Bruce Authorized by: Duffy Bruce   Critical care provider statement:    Critical care time (minutes):  35   Critical care time was exclusive of:  Separately billable procedures and treating other patients   Critical care was necessary to treat or prevent imminent or life-threatening deterioration of the following conditions:  Circulatory failure, shock and cardiac failure   Critical care was time spent personally by me on the following activities:  Development of treatment plan with patient or surrogate, discussions with consultants, evaluation of patient's response to treatment, examination of patient, obtaining history from patient or surrogate, ordering and performing treatments and interventions, ordering and review of laboratory studies, re-evaluation of patient's condition, pulse oximetry, ordering and review of radiographic studies and review of old charts   I assumed direction of  critical care for this patient from another provider in my specialty: no      (including critical care time)  Medications Ordered in ED Medications  0.9 %  sodium chloride infusion (not administered)  pantoprazole (PROTONIX) injection 40 mg (40 mg Intravenous Given 07/30/16 1640)     Initial Impression / Assessment and Plan / ED Course  I have reviewed the triage vital signs and the nursing notes.  Pertinent labs & imaging results that were available during my care of the patient were reviewed by me and considered in my medical decision making (see chart for details).  Clinical Course     80 yo M with extensive PMHx as above here with generalized weakness and SOB with exertion x several days. On Augmentin for his chronic bronchiectasis with recent exacerbation. On arrival, VSS. Pt clinically well appearing. Rectal exam shows gross melena but no BRBPR. Labs as above, remarkable for acute anemia with Hgb 6.8, down from 8.4 four days ago. On ASA/Plavix. Labs o/w at baseline. Will start IVF, PPI, and transfuse for acute, symptomatic anemia likely 2/2 UGIB. D/w [facility] who will evaluate in ED. Will admit to tele. Pt in agreement, consented for blood.   Final Clinical Impressions(s) / ED Diagnoses   Final diagnoses:  Symptomatic anemia  Gastrointestinal hemorrhage with melena    New Prescriptions New Prescriptions   No medications on file     Duffy Bruce, MD 07/30/16 1650

## 2016-07-30 NOTE — Consult Note (Signed)
Reason for Consult: Melena, symptomatic anemia, heme positive stool Referring Physician: [facility]  Blossom Hoops HPI: This is a 80 year old male with a PMH of esophageal strictures, anemia, Wegener's granulomatosis, HTN, CAD, and diverticulosis admitted for symptomatic anemia.  He has a baseline level of SOB, but it markedly worsened with minor exertion over the past 3-4 days.  This was associated with melena and in the ER he was found to be heme positive.  His HGB was low at 6.8 g/dL and his baseline is typically in the 9-10 range.  He denies taking any NSAIDs outside of ASA 81 mg, which he stopped on Monday.  He stopped the medication as he noticed some minor epistaxis.  No complaints of nausea or vomiting, but has some problems with GERD from time to time.  His last EGD with Dr. Fuller Plan was on 10/13/2015 and he was dilated for his benign intrinsic lower esophageal strictures.  A mild erosive gastritis was identified and the biopsies were negative for any evidence of H. Pylori.  His last colonoscopy was on 08/16/2011 with findings of a small sigmoid colon serrated adenoma and left sided diverticulosis.  He had an excellent prep.  Past Medical History:  Diagnosis Date  . Adenomatous colon polyp   . Anemia   . Asthma   . BPH (benign prostatic hyperplasia)    sees Dr. Risa Grill, biopsy June 2015 was benign   . Bronchiectasis ([place])   . CAD (coronary artery disease)    a. [place] s/p [place] x3 and ultimately BMS to pLAD& POBA to CTO of circumflex/marginal vessel on 03/17/14 and staged BMS to [facility] on 03/21/14  . Cataract   . Diverticulosis   . Femoral artery pseudo-aneurysm, right ([place])    a. s/p repair. Follow up with Dr. Kellie Simmering   . Fungal infection    lungs  . GERD (gastroesophageal reflux disease)   . Hiatal hernia   . HOH (hard of hearing)    bilaterally  . HTN (hypertension) 09/28/2013  . On home oxygen therapy    uses 2 l/m nasally at bedtime  . Peptic stricture of esophagus   . Wegener's  granulomatosis ([place])    sees Dr. [name]     Past Surgical History:  Procedure Laterality Date  . CARDIAC CATHETERIZATION  03/17/2014   Procedure: CORONARY BALLOON ANGIOPLASTY;  Surgeon: Troy Sine, MD;  Location: [facility] CATH LAB;  Service: Cardiovascular;;  . CATARACT EXTRACTION, BILATERAL  12-18-12   bilateral  . CHOLECYSTECTOMY N/A 12/21/2012   Procedure: LAPAROSCOPIC CHOLECYSTECTOMY WITH INTRAOPERATIVE CHOLANGIOGRAM;  Surgeon: Edward Jolly, MD;  Location: [facility] ORS;  Service: General;  Laterality: N/A;  . COLONOSCOPY  08-16-11   per Dr. Fuller Plan, diverticulosis and polyps, repeat in 5 yrs   . CORONARY ANGIOPLASTY  03/2014  . ESOPHAGOGASTRODUODENOSCOPY (EGD) WITH ESOPHAGEAL DILATION  11-29-10   per Dr. Fuller Plan   . EYE SURGERY     cataracts removed.   Marland Kitchen HEMATOMA EVACUATION Right 03/19/2014   Procedure: Suture repair of femoral artery with evacuation of hematoma;  Surgeon: Mal Misty, MD;  Location: [facility];  Service: Vascular;  Laterality: Right;  . HERNIA REPAIR    . LEFT HEART CATHETERIZATION WITH CORONARY ANGIOGRAM N/A 03/14/2014   Procedure: LEFT HEART CATHETERIZATION WITH CORONARY ANGIOGRAM;  Surgeon: Leonie Man, MD;  Location: [facility] CATH LAB;  Service: Cardiovascular;  Laterality: N/A;  . PERCUTANEOUS CORONARY STENT INTERVENTION (PCI-S) N/A 03/17/2014   Procedure: PERCUTANEOUS CORONARY STENT INTERVENTION (PCI-S);  Surgeon:  Troy Sine, MD;  Location: [facility] CATH LAB;  Service: Cardiovascular;  Laterality: N/A;  . PERCUTANEOUS CORONARY STENT INTERVENTION (PCI-S) N/A 03/21/2014   Procedure: PERCUTANEOUS CORONARY STENT INTERVENTION (PCI-S);  Surgeon: Burnell Blanks, MD;  Location: [facility] CATH LAB;  Service: Cardiovascular;  Laterality: N/A;  . RENAL BIOPSY      Family History  Problem Relation Age of Onset  . Asthma Father   . Coronary artery disease Mother   . Coronary artery disease Brother   . Coronary artery disease Brother   . Colon cancer Neg Hx   . Stomach cancer Neg Hx      Social History:  reports that he has never smoked. He has never used smokeless tobacco. He reports that he does not drink alcohol or use drugs.  Allergies: No Known Allergies  Medications:  Scheduled:  Continuous: . sodium chloride      Results for orders placed or performed during the hospital encounter of 07/30/16 (from the past 24 hour(s))  CBC with Differential     Status: Abnormal   Collection Time: 07/30/16  2:55 PM  Result Value Ref Range   WBC 5.3 4.0 - 10.5 K/uL   RBC 1.85 (L) 4.22 - 5.81 MIL/uL   Hemoglobin 6.8 (LL) 13.0 - 17.0 g/dL   HCT 21.2 (L) 39.0 - 52.0 %   MCV 114.6 (H) 78.0 - 100.0 fL   [name] 36.8 (H) 26.0 - 34.0 pg   MCHC 32.1 30.0 - 36.0 g/dL   RDW 15.1 11.5 - 15.5 %   Platelets 153 150 - 400 K/uL   Neutrophils Relative % 87 %   Lymphocytes Relative 5 %   Monocytes Relative 7 %   Eosinophils Relative 1 %   Basophils Relative 0 %   Neutro Abs 4.5 1.7 - 7.7 K/uL   Lymphs Abs 0.3 (L) 0.7 - 4.0 K/uL   Monocytes Absolute 0.4 0.1 - 1.0 K/uL   Eosinophils Absolute 0.1 0.0 - 0.7 K/uL   Basophils Absolute 0.0 0.0 - 0.1 K/uL   RBC Morphology CRENATED RBCs   Comprehensive metabolic panel     Status: Abnormal   Collection Time: 07/30/16  2:55 PM  Result Value Ref Range   Sodium 144 135 - 145 mmol/L   Potassium 4.2 3.5 - 5.1 mmol/L   Chloride 112 (H) 101 - 111 mmol/L   CO2 25 22 - 32 mmol/L   Glucose, Bld 105 (H) 65 - 99 mg/dL   BUN 36 (H) 6 - 20 mg/dL   Creatinine, Ser 2.00 (H) 0.61 - 1.24 mg/dL   Calcium 8.2 (L) 8.9 - 10.3 mg/dL   Total Protein 5.2 (L) 6.5 - 8.1 g/dL   Albumin 3.1 (L) 3.5 - 5.0 g/dL   AST 16 15 - 41 U/L   ALT 13 (L) 17 - 63 U/L   Alkaline Phosphatase 63 38 - 126 U/L   Total Bilirubin 0.5 0.3 - 1.2 mg/dL   GFR calc non Af Amer 30 (L) >60 mL/min   GFR calc Af Amer 35 (L) >60 mL/min   Anion gap 7 5 - 15  Brain natriuretic peptide     Status: Abnormal   Collection Time: 07/30/16  2:55 PM  Result Value Ref Range   B Natriuretic  Peptide 287.4 (H) 0.0 - 100.0 pg/mL  I-Stat Troponin, ED - 0, 3, 6 hours (not at [facility])     Status: None   Collection Time: 07/30/16  3:07 PM  Result Value Ref Range  Troponin i, poc 0.02 0.00 - 0.08 ng/mL   Comment 3          I-Stat CG4 Lactic Acid, ED     Status: None   Collection Time: 07/30/16  3:09 PM  Result Value Ref Range   Lactic Acid, Venous 0.95 0.5 - 1.9 mmol/L  POC occult blood, ED     Status: Abnormal   Collection Time: 07/30/16  3:59 PM  Result Value Ref Range   Fecal Occult Bld POSITIVE (A) NEGATIVE     Dg Chest 2 View  Result Date: 07/30/2016 CLINICAL DATA:  Shortness of breath with exertion EXAM: CHEST  2 VIEW COMPARISON:  06/09/2016 FINDINGS: Cardiac shadow is stable. Hiatal hernia is again seen and stable. Patchy fibrotic changes are noted bilaterally stable from the prior exam. Bronchial dilatation consistent with bronchiectasis seen previously is noted. No acute bony abnormality is seen. IMPRESSION: Chronic changes without acute abnormality. Electronically Signed   By: Inez Catalina M.D.   On: 07/30/2016 15:06   Ct Chest Wo Contrast  Result Date: 07/30/2016 CLINICAL DATA:  Weakness and shortness of Breath EXAM: CT CHEST WITHOUT CONTRAST TECHNIQUE: Multidetector CT imaging of the chest was performed following the standard protocol without IV contrast. COMPARISON:  01/07/2016 FINDINGS: Cardiovascular: Somewhat limited by the lack of IV contrast. Diffuse aortic calcifications are seen without aneurysmal dilatation. Heavy coronary calcifications are seen. The cardiac structures are otherwise within normal limits. Mediastinum/Nodes: Large hiatal hernia is again identified. No significant hilar or mediastinal adenopathy is noted. The thoracic inlet is within normal limits. Lungs/Pleura: Changes of bronchiectasis are again seen in the lower lobes bilaterally. The degree of mucous plugging has improved significantly in the interval from the prior exam. Chronic fibrotic changes are  seen bilaterally. Some stable areas of nodularity are noted particularly in the right lower lobe but to a lesser degree in the right upper lobe. These are stable in appearance from the prior exam. Continued follow-up. Upper Abdomen:  No acute abnormality noted. Musculoskeletal: No chest wall mass or suspicious bone lesions identified. IMPRESSION: Changes of bronchiectasis predominately in the lower lobes bilaterally. The degree of mucous plugging has improved from the prior exam. Persistent postinflammatory changes in the right lower lobe and to a lesser degree in right upper lobe stable from the previous exam. No new focal abnormality is seen. Large stable hiatal hernia. Electronically Signed   By: Inez Catalina M.D.   On: 07/30/2016 15:38    ROS:  As stated above in the HPI otherwise negative.  Blood pressure 110/88, pulse 84, temperature 98.4 F (36.9 C), resp. rate 16, height 5\' 7"  (1.702 m), weight 55.3 kg (122 lb), SpO2 97 %.    PE: Gen: NAD, Alert and Oriented HEENT:  [place]/AT, EOMI Neck: Supple, no LAD Lungs: CTA Bilaterally CV: RRR without M/G/R ABM: Soft, NTND, +BS Ext: No C/C/E  Assessment/Plan: 1) Melena. 2) Anemia. 3) Heme positive stool.   I will pursue further evaluation with an EGD for his current presentation.  Pending the results I will make further recommendations.  He had epistaxis recently, but I do not believe the volume of epistaxis correlates with his significant anemia.  Plan: 1) EGD tomorrow. 2) Transfuse as necessary. 3) PPI.  Aidric Endicott D 07/30/2016, 4:38 PM

## 2016-07-30 NOTE — ED Triage Notes (Signed)
Pt from home and lives with wife.  States he recently finished amoxicillin a few days ago for "chest congestion" but feels he hasn't gotten better.  States he has been feeling weak and has had black stools x last 3-4 days.  Has recently seen cardiologist, nephrologist, and gastrologist recently but hasn't had any definitive dx's.  Denies pain at rest but states has pain w/exertion when he has to "breathe real hard."  Denies n/v/d or fever. States he's ambulatory but can't walk as far as he usually does w/o feeling shob or weak.

## 2016-07-31 ENCOUNTER — Encounter (HOSPITAL_COMMUNITY)
Admission: EM | Disposition: A | Discharge status: Home / Self Care | Payer: Self-pay | Source: Home / Self Care | Attending: Family Medicine

## 2016-07-31 ENCOUNTER — Encounter (HOSPITAL_COMMUNITY): Payer: Self-pay | Admitting: *Deleted

## 2016-07-31 HISTORY — PX: ESOPHAGOGASTRODUODENOSCOPY: SHX5428

## 2016-07-31 LAB — BASIC METABOLIC PANEL
Anion gap: 6 (ref 5–15)
BUN: 26 mg/dL — ABNORMAL HIGH (ref 6–20)
CHLORIDE: 112 mmol/L — AB (ref 101–111)
CO2: 26 mmol/L (ref 22–32)
CREATININE: 2.02 mg/dL — AB (ref 0.61–1.24)
Calcium: 8.5 mg/dL — ABNORMAL LOW (ref 8.9–10.3)
GFR calc Af Amer: 35 mL/min — ABNORMAL LOW (ref >60)
GFR calc non Af Amer: 30 mL/min — ABNORMAL LOW (ref >60)
Glucose, Bld: 89 mg/dL (ref 65–99)
POTASSIUM: 4.7 mmol/L (ref 3.5–5.1)
Sodium: 144 mmol/L (ref 135–145)

## 2016-07-31 LAB — CBC
HEMATOCRIT: 23.8 % — AB (ref 39.0–52.0)
HEMATOCRIT: 24.6 % — AB (ref 39.0–52.0)
HEMOGLOBIN: 7.9 g/dL — AB (ref 13.0–17.0)
Hemoglobin: 8.2 g/dL — ABNORMAL LOW (ref 13.0–17.0)
MCH: 35.3 pg — ABNORMAL HIGH (ref 26.0–34.0)
MCH: 35.2 pg — ABNORMAL HIGH (ref 26.0–34.0)
MCHC: 33.2 g/dL (ref 30.0–36.0)
MCHC: 33.3 g/dL (ref 30.0–36.0)
MCV: 106.3 fL — AB (ref 78.0–100.0)
MCV: 105.6 fL — AB (ref 78.0–100.0)
Platelets: 158 10*3/uL (ref 150–400)
Platelets: 152 10*3/uL (ref 150–400)
RBC: 2.24 MIL/uL — ABNORMAL LOW (ref 4.22–5.81)
RBC: 2.33 MIL/uL — ABNORMAL LOW (ref 4.22–5.81)
RDW: 20.2 % — AB (ref 11.5–15.5)
RDW: 20.2 % — AB (ref 11.5–15.5)
WBC: 4.4 10*3/uL (ref 4.0–10.5)
WBC: 4.9 10*3/uL (ref 4.0–10.5)

## 2016-07-31 LAB — RETICULOCYTES
RBC.: 3.2 MIL/uL — ABNORMAL LOW (ref 4.22–5.81)
RETIC CT PCT: 3 % (ref 0.4–3.1)
Retic Count, Absolute: 96 10*3/uL (ref 19.0–186.0)

## 2016-07-31 LAB — PREPARE RBC (CROSSMATCH)

## 2016-07-31 SURGERY — EGD (ESOPHAGOGASTRODUODENOSCOPY)
Anesthesia: Moderate Sedation

## 2016-07-31 MED ORDER — PEG 3350-KCL-NA BICARB-NACL 420 G PO SOLR
4000.0000 mL | Freq: Once | ORAL | Status: AC
  Administered 2016-07-31: 4000 mL via ORAL

## 2016-07-31 MED ORDER — FENTANYL CITRATE (PF) 100 MCG/2ML IJ SOLN
INTRAMUSCULAR | Status: AC
  Filled 2016-07-31: qty 4

## 2016-07-31 MED ORDER — FENTANYL CITRATE (PF) 100 MCG/2ML IJ SOLN
INTRAMUSCULAR | Status: DC | PRN
  Administered 2016-07-31 (×4): 12.5 ug via INTRAVENOUS

## 2016-07-31 MED ORDER — DIPHENHYDRAMINE HCL 25 MG PO CAPS
25.0000 mg | ORAL_CAPSULE | Freq: Once | ORAL | Status: AC
Start: 2016-07-31 — End: 2016-07-31
  Administered 2016-07-31: 25 mg via ORAL
  Filled 2016-07-31: qty 1

## 2016-07-31 MED ORDER — DIPHENHYDRAMINE HCL 50 MG/ML IJ SOLN
INTRAMUSCULAR | Status: AC
  Filled 2016-07-31: qty 1

## 2016-07-31 MED ORDER — SODIUM CHLORIDE 0.9 % IV SOLN: Freq: Once | INTRAVENOUS | Status: DC

## 2016-07-31 MED ORDER — MIDAZOLAM HCL 5 MG/ML IJ SOLN
INTRAMUSCULAR | Status: AC
  Filled 2016-07-31: qty 3

## 2016-07-31 MED ORDER — SODIUM CHLORIDE 0.9 % IV SOLN: INTRAVENOUS | Status: DC

## 2016-07-31 MED ORDER — MIDAZOLAM HCL 10 MG/2ML IJ SOLN
INTRAMUSCULAR | Status: DC | PRN
  Administered 2016-07-31 (×4): 1 mg via INTRAVENOUS

## 2016-07-31 NOTE — Op Note (Signed)
[facility] Patient Name: Richard [name] Procedure Date: 07/31/2016 MRN: [id number] Attending MD: Carol Ada , MD Date of Birth: 02-25-1936 CSN: [id number] Age: 80 Admit Type: Inpatient Procedure:                Upper GI endoscopy Indications:              Iron deficiency anemia, Heme positive stool, Melena Providers:                Carol Ada, MD, Elna Breslow, RN, William Dalton, Technician Referring MD:              Medicines:                Midazolam 4 mg IV, Fentanyl 50 micrograms IV Complications:            No immediate complications. Estimated Blood Loss:     Estimated blood loss: none. Procedure:                Pre-Anesthesia Assessment:                           - Prior to the procedure, a History and Physical                            was performed, and patient medications and                            allergies were reviewed. The patient's tolerance of                            previous anesthesia was also reviewed. The risks                            and benefits of the procedure and the sedation                            options and risks were discussed with the patient.                            All questions were answered, and informed consent                            was obtained. Prior Anticoagulants: The patient has                            taken no previous anticoagulant or antiplatelet                            agents. ASA Grade Assessment: III - A patient with                            severe systemic disease. After reviewing the risks  and benefits, the patient was deemed in                            satisfactory condition to undergo the procedure.                           - Sedation was administered by an endoscopy nurse.                            The sedation level attained was moderate.                           After obtaining informed consent, the endoscope was                         passed under direct vision. Throughout the                            procedure, the patient's blood pressure, pulse, and                            oxygen saturations were monitored continuously. The                            [id number]) scope was introduced through the                            mouth, and advanced to the fourth part of duodenum.                            The upper GI endoscopy was accomplished without                            difficulty. The patient tolerated the procedure                            well. Scope In: Scope Out: Findings:      One moderate benign-appearing, intrinsic stenosis was found. This       measured less than one cm (in length) and was traversed.      A 5 cm hiatal hernia was present.      The stomach was normal.      The examined duodenum was normal.      A large hiatal hernia was identified. No evidence of esophagitis or       Cameron's erosions. There was no evidence of any AVMs in the examined       upper GI tract. Impression:               - Benign-appearing esophageal stenosis.                           - 5 cm hiatal hernia.                           - Normal stomach.                           -  Normal examined duodenum.                           - No specimens collected. Moderate Sedation:      Moderate (conscious) sedation was administered by the endoscopy nurse       and supervised by the endoscopist. The following parameters were       monitored: oxygen saturation, heart rate, blood pressure, and response       to care. Recommendation:           - Return patient to hospital ward for ongoing care.                           - Clear liquid diet.                           - Continue present medications.                           - ? colonoscopy tomorrow. Patient's family will                            talk with the patient after he wakes up. Procedure Code(s):        --- Professional ---                            [phone], Esophagogastroduodenoscopy, flexible,                            transoral; diagnostic, including collection of                            specimen(s) by brushing or washing, when performed                            (separate procedure) Diagnosis Code(s):        --- Professional ---                           K22.2, Esophageal obstruction                           K44.9, Diaphragmatic hernia without obstruction or                            gangrene                           D50.9, Iron deficiency anemia, unspecified                           R19.5, Other fecal abnormalities                           K92.1, Melena (includes Hematochezia) CPT copyright 2016 [employer]. All rights reserved. The codes documented in this report are preliminary and upon coder review may  be revised to meet current compliance requirements. Carol Ada, MD Carol Ada, MD  07/31/2016 11:53:44 AM This report has been signed electronically. Number of Addenda: 0

## 2016-07-31 NOTE — Interval H&P Note (Signed)
History and Physical Interval Note:  07/31/2016 11:03 AM  Richard [name]  has presented today for surgery, with the diagnosis of Anemia and melena  The various methods of treatment have been discussed with the patient and family. After consideration of risks, benefits and other options for treatment, the patient has consented to  Procedure(s): ESOPHAGOGASTRODUODENOSCOPY (EGD) (N/A) as a surgical intervention .  The patient's history has been reviewed, patient examined, no change in status, stable for surgery.  I have reviewed the patient's chart and labs.  Questions were answered to the patient's satisfaction.     Rance Smithson D

## 2016-07-31 NOTE — Progress Notes (Signed)
PROGRESS NOTE    Richard [name]  [id number] DOB: [birth year] DOA: 07/30/2016 PCP: Alysia Penna, MD    Brief Narrative:  Richard [name] is a 80 y.o. 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  Patient states that he had congestion and was producing yellow sputum slightly over a week ago.  He was given antibiotics to take if this happens and he started taking amoxicillin as he normally does when this happens.  He took this up until 2 days ago and he reported no improvement.  In fact he reports a worsening of dyspnea where he could not longer barely stand without getting short of breath.  This morning he went to the drug store and upon returning couldn't catch his breath.  It was at that time he decided to come to the ED.    He reports black tarry stools since taking the Amoxicillin and reports that it has never done that previously.  He has been getting weekly injections for anemia for the past year and received two shots 5 days prior to admission.  No chest pain, chest pressure, vomiting, dysuria, hematuria.  Patient has been having about 2 bowel movements every morning and reports all bowel movements have been black and tarry for the past couple of days.  Occasional abdominal pain located in the epigastric area and then in the lower rectal area after bowel movements.  Lives at home with his wife- able to ambulate and perform ADL's independently.   Assessment & Plan:   Active Problems:   Wegener's granulomatosis ([place])   Obstructive bronchiectasis (HCC)   Chronic respiratory failure (HCC)   HBP (high blood pressure)   CAD- severe 4 V CAD- not CABG candidate   Chronic kidney disease, stage IV (severe) (HCC)   Aortic valve stenosis   Symptomatic anemia   GIB (gastrointestinal bleeding)   Symptomatic anemia 2/2 GIB - Gastroenterology consulted - 2 large bore IVs - blood transfusion ordered by ED physician -  patient is s/p 1 unit PRBC - will give additional unit today - EGD planned for today - hemoccult positive - continue PPI - NPO for now - will order anemia panel to elevate for secondary causes of anemia as well (MCV >100) - monitor vitals closely   CKD Stage IV 2/2 Wegener's granulomatosis - Cr appears to be at patients baseline (~2) - on bicarb at home, need to hold PO medications at this time - on cytoxan at home again holding all PO medications 2/2 GIB - will restart home medications when patient cleared by GI to restart diet  Obstructive bronchiectasis - patient does not wear oxygen at any time outpatient - continue to monitor respiratory status  CAD - patient on plavix and aspirin at home - will need to hold these medications for time being - will order IV metoprolol as patient takes metoprolol at home - will restart home medications when patient cleared by GI to restart diet     DVT prophylaxis: SCD's due to GIB Code Status: Full Code Family Communication: no family is bedside Disposition Plan: Discharge back to previous home environment    Consultants:   [facility]  Procedures:   EGD 12/24  Antimicrobials:   None    Subjective: Patient says he slept well overnight.  Reports he feels a slight increase in pain in his lower abdomen- more than yesterday.  No bowel movements since yesterday before he came into the hospital.  Plan is for EGD today.  Objective: Vitals:   07/31/16 1105 07/31/16 1110 07/31/16 1115 07/31/16 1120  BP: (!) 141/77 (!) 163/127 (!) 129/54   Pulse: 83 80 85 79  Resp: 20 20 19 17   Temp:      TempSrc: Oral     SpO2: 96% 94% 100% 99%  Weight:      Height:        Intake/Output Summary (Last 24 hours) at 07/31/16 1123 Last data filed at 07/31/16 0643  Gross per 24 hour  Intake              384 ml  Output              200 ml  Net              184 ml   Filed Weights   07/30/16 1358 07/30/16 1840  Weight: 55.3 kg (122  lb) 54.4 kg (119 lb 14.9 oz)    Examination:  General exam: Appears calm and comfortable  Respiratory system: Clear to auscultation. Respiratory effort normal. Cardiovascular system: S1 & S2 heard, RRR. No JVD, rubs, gallops or clicks.  III/VI systolic murmur heard best at aortic area. No pedal edema. Gastrointestinal system: Abdomen is nondistended, soft and minimally tender in lower quadrants. No organomegaly or masses felt. Normal bowel sounds heard. Central nervous system: Alert and oriented. No focal neurological deficits. Extremities: Symmetric 5 x 5 power. Skin: No rashes, lesions or ulcers.  Skin darkening on bilateral dorsal forearms Psychiatry: Judgement and insight appear normal. Mood & affect appropriate.     Data Reviewed: I have personally reviewed following labs and imaging studies  CBC:  Recent Labs Lab 07/30/16 1455 07/30/16 1806 07/30/16 2219 07/31/16 0635 07/31/16 0942  WBC 5.3 4.7 4.2 4.4 4.9  NEUTROABS 4.5  --   --   --   --   HGB 6.8* 6.8* 6.6* 7.9* 8.2*  HCT 21.2* 20.7* 20.3* 23.8* 24.6*  MCV 114.6* 113.1* 113.4* 106.3* 105.6*  PLT 153 153 155 158 0000000   Basic Metabolic Panel:  Recent Labs Lab 07/30/16 1455  NA 144  K 4.2  CL 112*  CO2 25  GLUCOSE 105*  BUN 36*  CREATININE 2.00*  CALCIUM 8.2*   GFR: Estimated Creatinine Clearance: 23.4 mL/min (by C-G formula based on SCr of 2 mg/dL (H)). Liver Function Tests:  Recent Labs Lab 07/30/16 1455  AST 16  ALT 13*  ALKPHOS 63  BILITOT 0.5  PROT 5.2*  ALBUMIN 3.1*   No results for input(s): LIPASE, AMYLASE in the last 168 hours. No results for input(s): AMMONIA in the last 168 hours. Coagulation Profile: No results for input(s): INR, PROTIME in the last 168 hours. Cardiac Enzymes: No results for input(s): CKTOTAL, CKMB, CKMBINDEX, TROPONINI in the last 168 hours. BNP (last 3 results) No results for input(s): PROBNP in the last 8760 hours. HbA1C: No results for input(s): HGBA1C in the  last 72 hours. CBG: No results for input(s): GLUCAP in the last 168 hours. Lipid Profile: No results for input(s): CHOL, HDL, LDLCALC, TRIG, CHOLHDL, LDLDIRECT in the last 72 hours. Thyroid Function Tests: No results for input(s): TSH, T4TOTAL, FREET4, T3FREE, THYROIDAB in the last 72 hours. Anemia Panel: No results for input(s): VITAMINB12, FOLATE, FERRITIN, TIBC, IRON, RETICCTPCT in the last 72 hours. Sepsis Labs:  Recent Labs Lab 07/30/16 1509 07/30/16 1818  LATICACIDVEN 0.95 0.61    Recent Results (from the past 240 hour(s))  Respiratory or Resp and Sputum  Culture     Status: None   Collection Time: 07/22/16 12:48 PM  Result Value Ref Range Status   Culture Moderate PSEUDOMONAS AERUGINOSA  Final   Gram Stain No WBC Seen  Final   Gram Stain No Squamous Epithelial Cells Seen  Final   Gram Stain Moderate Gram Negative Rods  Final   Organism ID, Bacteria PSEUDOMONAS AERUGINOSA  Final      Susceptibility   Pseudomonas aeruginosa -  (no method available)    PIP/TAZO  Sensitive     TICAR/CLAVULANIC  Intermediate     IMIPENEM  Sensitive     CEFTAZIDIME  Sensitive     CEFEPIME  Sensitive     GENTAMICIN  Sensitive     TOBRAMYCIN  Sensitive     CIPROFLOXACIN  Sensitive   AFB Culture & Smear     Status: None (Preliminary result)   Collection Time: 07/22/16 12:48 PM  Result Value Ref Range Status   Source: ;  Preliminary   Acid Fast Bacilli (AFB) Culture    Preliminary    Comment:   MYCOBACTERIA, CULTURE, WITH FLUOROCHROME SMEAR       MICRO NUMBER:      [id number]   TEST STATUS:       PRELIMINARY   SPECIMEN SOURCE:   SPUTUM   SPECIMEN QUALITY:  ADEQUATE   SMEAR:             No acid fast bacilli seen.   RESULT:            Culture results to follow. Final reports of                      negative cultures can be expected in approximately                      six weeks. Positive cultures are reported                      immediately.          Radiology Studies: Dg Chest  2 View  Result Date: 07/30/2016 CLINICAL DATA:  Shortness of breath with exertion EXAM: CHEST  2 VIEW COMPARISON:  06/09/2016 FINDINGS: Cardiac shadow is stable. Hiatal hernia is again seen and stable. Patchy fibrotic changes are noted bilaterally stable from the prior exam. Bronchial dilatation consistent with bronchiectasis seen previously is noted. No acute bony abnormality is seen. IMPRESSION: Chronic changes without acute abnormality. Electronically Signed   By: Inez Catalina M.D.   On: 07/30/2016 15:06   Ct Chest Wo Contrast  Result Date: 07/30/2016 CLINICAL DATA:  Weakness and shortness of Breath EXAM: CT CHEST WITHOUT CONTRAST TECHNIQUE: Multidetector CT imaging of the chest was performed following the standard protocol without IV contrast. COMPARISON:  01/07/2016 FINDINGS: Cardiovascular: Somewhat limited by the lack of IV contrast. Diffuse aortic calcifications are seen without aneurysmal dilatation. Heavy coronary calcifications are seen. The cardiac structures are otherwise within normal limits. Mediastinum/Nodes: Large hiatal hernia is again identified. No significant hilar or mediastinal adenopathy is noted. The thoracic inlet is within normal limits. Lungs/Pleura: Changes of bronchiectasis are again seen in the lower lobes bilaterally. The degree of mucous plugging has improved significantly in the interval from the prior exam. Chronic fibrotic changes are seen bilaterally. Some stable areas of nodularity are noted particularly in the right lower lobe but to a lesser degree in the right upper lobe. These are stable in appearance from the  prior exam. Continued follow-up. Upper Abdomen:  No acute abnormality noted. Musculoskeletal: No chest wall mass or suspicious bone lesions identified. IMPRESSION: Changes of bronchiectasis predominately in the lower lobes bilaterally. The degree of mucous plugging has improved from the prior exam. Persistent postinflammatory changes in the right lower lobe and  to a lesser degree in right upper lobe stable from the previous exam. No new focal abnormality is seen. Large stable hiatal hernia. Electronically Signed   By: Inez Catalina M.D.   On: 07/30/2016 15:38        Scheduled Meds: . [MAR Hold] sodium chloride   Intravenous Once  . [MAR Hold] metoprolol  2.5 mg Intravenous QPM   Continuous Infusions: . sodium chloride 20 mL/hr at 07/30/16 2048     LOS: 1 day    Time spent: 35 minutes    Loretha Stapler, MD [facility] Pager [phone]  If 7PM-7AM, please contact night-coverage [email or web address] Password [facility] 07/31/2016, 11:23 AM

## 2016-07-31 NOTE — Progress Notes (Signed)
Received patient back from Endo. Alert. VS T 98.5-P84-R 16- O2 Sat 95% on Room Air. Patient has decided to remain in hosp until further testing completed. Eulas Post, RN

## 2016-07-31 NOTE — H&P (View-Only) (Signed)
Reason for Consult: Melena, symptomatic anemia, heme positive stool Referring Physician: [facility]  Blossom Hoops HPI: This is a 80 year old male with a PMH of esophageal strictures, anemia, Wegener's granulomatosis, HTN, CAD, and diverticulosis admitted for symptomatic anemia.  He has a baseline level of SOB, but it markedly worsened with minor exertion over the past 3-4 days.  This was associated with melena and in the ER he was found to be heme positive.  His HGB was low at 6.8 g/dL and his baseline is typically in the 9-10 range.  He denies taking any NSAIDs outside of ASA 81 mg, which he stopped on Monday.  He stopped the medication as he noticed some minor epistaxis.  No complaints of nausea or vomiting, but has some problems with GERD from time to time.  His last EGD with Dr. Fuller Plan was on 10/13/2015 and he was dilated for his benign intrinsic lower esophageal strictures.  A mild erosive gastritis was identified and the biopsies were negative for any evidence of H. Pylori.  His last colonoscopy was on 08/16/2011 with findings of a small sigmoid colon serrated adenoma and left sided diverticulosis.  He had an excellent prep.  Past Medical History:  Diagnosis Date  . Adenomatous colon polyp   . Anemia   . Asthma   . BPH (benign prostatic hyperplasia)    sees Dr. Risa Grill, biopsy June 2015 was benign   . Bronchiectasis ([place])   . CAD (coronary artery disease)    a. [place] s/p [place] x3 and ultimately BMS to pLAD& POBA to CTO of circumflex/marginal vessel on 03/17/14 and staged BMS to [facility] on 03/21/14  . Cataract   . Diverticulosis   . Femoral artery pseudo-aneurysm, right ([place])    a. s/p repair. Follow up with Dr. Kellie Simmering   . Fungal infection    lungs  . GERD (gastroesophageal reflux disease)   . Hiatal hernia   . HOH (hard of hearing)    bilaterally  . HTN (hypertension) 09/28/2013  . On home oxygen therapy    uses 2 l/m nasally at bedtime  . Peptic stricture of esophagus   . Wegener's  granulomatosis ([place])    sees Dr. [name]     Past Surgical History:  Procedure Laterality Date  . CARDIAC CATHETERIZATION  03/17/2014   Procedure: CORONARY BALLOON ANGIOPLASTY;  Surgeon: Troy Sine, MD;  Location: [facility] CATH LAB;  Service: Cardiovascular;;  . CATARACT EXTRACTION, BILATERAL  12-18-12   bilateral  . CHOLECYSTECTOMY N/A 12/21/2012   Procedure: LAPAROSCOPIC CHOLECYSTECTOMY WITH INTRAOPERATIVE CHOLANGIOGRAM;  Surgeon: Edward Jolly, MD;  Location: [facility] ORS;  Service: General;  Laterality: N/A;  . COLONOSCOPY  08-16-11   per Dr. Fuller Plan, diverticulosis and polyps, repeat in 5 yrs   . CORONARY ANGIOPLASTY  03/2014  . ESOPHAGOGASTRODUODENOSCOPY (EGD) WITH ESOPHAGEAL DILATION  11-29-10   per Dr. Fuller Plan   . EYE SURGERY     cataracts removed.   Marland Kitchen HEMATOMA EVACUATION Right 03/19/2014   Procedure: Suture repair of femoral artery with evacuation of hematoma;  Surgeon: Mal Misty, MD;  Location: [facility];  Service: Vascular;  Laterality: Right;  . HERNIA REPAIR    . LEFT HEART CATHETERIZATION WITH CORONARY ANGIOGRAM N/A 03/14/2014   Procedure: LEFT HEART CATHETERIZATION WITH CORONARY ANGIOGRAM;  Surgeon: Leonie Man, MD;  Location[facility] CATH LAB;  Service: Cardiovascular;  Laterality: N/A;  . PERCUTANEOUS CORONARY STENT INTERVENTION (PCI-S) N/A 03/17/2014   Procedure: PERCUTANEOUS CORONARY STENT INTERVENTION (PCI-S);  Surgeon:  Troy Sine, MD;  Location: [facility] CATH LAB;  Service: Cardiovascular;  Laterality: N/A;  . PERCUTANEOUS CORONARY STENT INTERVENTION (PCI-S) N/A 03/21/2014   Procedure: PERCUTANEOUS CORONARY STENT INTERVENTION (PCI-S);  Surgeon: Burnell Blanks, MD;  Location: [facility] CATH LAB;  Service: Cardiovascular;  Laterality: N/A;  . RENAL BIOPSY      Family History  Problem Relation Age of Onset  . Asthma Father   . Coronary artery disease Mother   . Coronary artery disease Brother   . Coronary artery disease Brother   . Colon cancer Neg Hx   . Stomach cancer Neg Hx      Social History:  reports that he has never smoked. He has never used smokeless tobacco. He reports that he does not drink alcohol or use drugs.  Allergies: No Known Allergies  Medications:  Scheduled:  Continuous: . sodium chloride      Results for orders placed or performed during the hospital encounter of 07/30/16 (from the past 24 hour(s))  CBC with Differential     Status: Abnormal   Collection Time: 07/30/16  2:55 PM  Result Value Ref Range   WBC 5.3 4.0 - 10.5 K/uL   RBC 1.85 (L) 4.22 - 5.81 MIL/uL   Hemoglobin 6.8 (LL) 13.0 - 17.0 g/dL   HCT 21.2 (L) 39.0 - 52.0 %   MCV 114.6 (H) 78.0 - 100.0 fL   [name] 36.8 (H) 26.0 - 34.0 pg   MCHC 32.1 30.0 - 36.0 g/dL   RDW 15.1 11.5 - 15.5 %   Platelets 153 150 - 400 K/uL   Neutrophils Relative % 87 %   Lymphocytes Relative 5 %   Monocytes Relative 7 %   Eosinophils Relative 1 %   Basophils Relative 0 %   Neutro Abs 4.5 1.7 - 7.7 K/uL   Lymphs Abs 0.3 (L) 0.7 - 4.0 K/uL   Monocytes Absolute 0.4 0.1 - 1.0 K/uL   Eosinophils Absolute 0.1 0.0 - 0.7 K/uL   Basophils Absolute 0.0 0.0 - 0.1 K/uL   RBC Morphology CRENATED RBCs   Comprehensive metabolic panel     Status: Abnormal   Collection Time: 07/30/16  2:55 PM  Result Value Ref Range   Sodium 144 135 - 145 mmol/L   Potassium 4.2 3.5 - 5.1 mmol/L   Chloride 112 (H) 101 - 111 mmol/L   CO2 25 22 - 32 mmol/L   Glucose, Bld 105 (H) 65 - 99 mg/dL   BUN 36 (H) 6 - 20 mg/dL   Creatinine, Ser 2.00 (H) 0.61 - 1.24 mg/dL   Calcium 8.2 (L) 8.9 - 10.3 mg/dL   Total Protein 5.2 (L) 6.5 - 8.1 g/dL   Albumin 3.1 (L) 3.5 - 5.0 g/dL   AST 16 15 - 41 U/L   ALT 13 (L) 17 - 63 U/L   Alkaline Phosphatase 63 38 - 126 U/L   Total Bilirubin 0.5 0.3 - 1.2 mg/dL   GFR calc non Af Amer 30 (L) >60 mL/min   GFR calc Af Amer 35 (L) >60 mL/min   Anion gap 7 5 - 15  Brain natriuretic peptide     Status: Abnormal   Collection Time: 07/30/16  2:55 PM  Result Value Ref Range   B Natriuretic  Peptide 287.4 (H) 0.0 - 100.0 pg/mL  I-Stat Troponin, ED - 0, 3, 6 hours (not at [facility])     Status: None   Collection Time: 07/30/16  3:07 PM  Result Value Ref Range  Troponin i, poc 0.02 0.00 - 0.08 ng/mL   Comment 3          I-Stat CG4 Lactic Acid, ED     Status: None   Collection Time: 07/30/16  3:09 PM  Result Value Ref Range   Lactic Acid, Venous 0.95 0.5 - 1.9 mmol/L  POC occult blood, ED     Status: Abnormal   Collection Time: 07/30/16  3:59 PM  Result Value Ref Range   Fecal Occult Bld POSITIVE (A) NEGATIVE     Dg Chest 2 View  Result Date: 07/30/2016 CLINICAL DATA:  Shortness of breath with exertion EXAM: CHEST  2 VIEW COMPARISON:  06/09/2016 FINDINGS: Cardiac shadow is stable. Hiatal hernia is again seen and stable. Patchy fibrotic changes are noted bilaterally stable from the prior exam. Bronchial dilatation consistent with bronchiectasis seen previously is noted. No acute bony abnormality is seen. IMPRESSION: Chronic changes without acute abnormality. Electronically Signed   By: Inez Catalina M.D.   On: 07/30/2016 15:06   Ct Chest Wo Contrast  Result Date: 07/30/2016 CLINICAL DATA:  Weakness and shortness of Breath EXAM: CT CHEST WITHOUT CONTRAST TECHNIQUE: Multidetector CT imaging of the chest was performed following the standard protocol without IV contrast. COMPARISON:  01/07/2016 FINDINGS: Cardiovascular: Somewhat limited by the lack of IV contrast. Diffuse aortic calcifications are seen without aneurysmal dilatation. Heavy coronary calcifications are seen. The cardiac structures are otherwise within normal limits. Mediastinum/Nodes: Large hiatal hernia is again identified. No significant hilar or mediastinal adenopathy is noted. The thoracic inlet is within normal limits. Lungs/Pleura: Changes of bronchiectasis are again seen in the lower lobes bilaterally. The degree of mucous plugging has improved significantly in the interval from the prior exam. Chronic fibrotic changes are  seen bilaterally. Some stable areas of nodularity are noted particularly in the right lower lobe but to a lesser degree in the right upper lobe. These are stable in appearance from the prior exam. Continued follow-up. Upper Abdomen:  No acute abnormality noted. Musculoskeletal: No chest wall mass or suspicious bone lesions identified. IMPRESSION: Changes of bronchiectasis predominately in the lower lobes bilaterally. The degree of mucous plugging has improved from the prior exam. Persistent postinflammatory changes in the right lower lobe and to a lesser degree in right upper lobe stable from the previous exam. No new focal abnormality is seen. Large stable hiatal hernia. Electronically Signed   By: Inez Catalina M.D.   On: 07/30/2016 15:38    ROS:  As stated above in the HPI otherwise negative.  Blood pressure 110/88, pulse 84, temperature 98.4 F (36.9 C), resp. rate 16, height 5\' 7"  (1.702 m), weight 55.3 kg (122 lb), SpO2 97 %.    PE: Gen: NAD, Alert and Oriented HEENT:  [place]/AT, EOMI Neck: Supple, no LAD Lungs: CTA Bilaterally CV: RRR without M/G/R ABM: Soft, NTND, +BS Ext: No C/C/E  Assessment/Plan: 1) Melena. 2) Anemia. 3) Heme positive stool.   I will pursue further evaluation with an EGD for his current presentation.  Pending the results I will make further recommendations.  He had epistaxis recently, but I do not believe the volume of epistaxis correlates with his significant anemia.  Plan: 1) EGD tomorrow. 2) Transfuse as necessary. 3) PPI.  Ellissa Ayo D 07/30/2016, 4:38 PM

## 2016-07-31 NOTE — Progress Notes (Signed)
I had a long discussion with the patient and his family.  They desire to undergo the colonoscopy tomorrow in order to help isolate the source of bleeding.

## 2016-08-01 ENCOUNTER — Encounter (HOSPITAL_COMMUNITY)
Admission: EM | Disposition: A | Discharge status: Home / Self Care | Payer: Self-pay | Source: Home / Self Care | Attending: Family Medicine

## 2016-08-01 ENCOUNTER — Encounter (HOSPITAL_COMMUNITY): Payer: Self-pay

## 2016-08-01 DIAGNOSIS — I10 Essential (primary) hypertension: Secondary | ICD-10-CM

## 2016-08-01 DIAGNOSIS — J471 Bronchiectasis with (acute) exacerbation: Secondary | ICD-10-CM

## 2016-08-01 HISTORY — PX: COLONOSCOPY: SHX5424

## 2016-08-01 LAB — CBC
HCT: 32.6 % — ABNORMAL LOW (ref 39.0–52.0)
HCT: 31.6 % — ABNORMAL LOW (ref 39.0–52.0)
HEMOGLOBIN: 11 g/dL — AB (ref 13.0–17.0)
Hemoglobin: 10.8 g/dL — ABNORMAL LOW (ref 13.0–17.0)
MCH: 34.4 pg — AB (ref 26.0–34.0)
MCH: 34.8 pg — ABNORMAL HIGH (ref 26.0–34.0)
MCHC: 33.7 g/dL (ref 30.0–36.0)
MCHC: 34.2 g/dL (ref 30.0–36.0)
MCV: 101.9 fL — ABNORMAL HIGH (ref 78.0–100.0)
MCV: 101.9 fL — ABNORMAL HIGH (ref 78.0–100.0)
PLATELETS: 170 10*3/uL (ref 150–400)
Platelets: 175 10*3/uL (ref 150–400)
RBC: 3.2 MIL/uL — ABNORMAL LOW (ref 4.22–5.81)
RBC: 3.1 MIL/uL — ABNORMAL LOW (ref 4.22–5.81)
RDW: 20.2 % — AB (ref 11.5–15.5)
WBC: 6.3 10*3/uL (ref 4.0–10.5)
WBC: 4.8 10*3/uL (ref 4.0–10.5)

## 2016-08-01 LAB — BASIC METABOLIC PANEL
Anion gap: 7 (ref 5–15)
BUN: 31 mg/dL — ABNORMAL HIGH (ref 6–20)
CALCIUM: 7.7 mg/dL — AB (ref 8.9–10.3)
CO2: 22 mmol/L (ref 22–32)
CREATININE: 1.77 mg/dL — AB (ref 0.61–1.24)
Chloride: 110 mmol/L (ref 101–111)
GFR calc non Af Amer: 35 mL/min — ABNORMAL LOW (ref >60)
GFR, EST AFRICAN AMERICAN: 41 mL/min — AB (ref >60)
Glucose, Bld: 118 mg/dL — ABNORMAL HIGH (ref 65–99)
Potassium: 3.6 mmol/L (ref 3.5–5.1)
SODIUM: 139 mmol/L (ref 135–145)

## 2016-08-01 LAB — IRON AND TIBC
Iron: 51 ug/dL (ref 45–182)
Saturation Ratios: 27 % (ref 17.9–39.5)
TIBC: 189 ug/dL — ABNORMAL LOW (ref 250–450)
UIBC: 138 ug/dL

## 2016-08-01 LAB — FOLATE: FOLATE: 33.5 ng/mL (ref >5.9)

## 2016-08-01 LAB — MAGNESIUM: MAGNESIUM: 2.1 mg/dL (ref 1.7–2.4)

## 2016-08-01 LAB — VITAMIN B12: Vitamin B-12: 565 pg/mL (ref 180–914)

## 2016-08-01 LAB — FERRITIN: FERRITIN: 331 ng/mL (ref 24–336)

## 2016-08-01 SURGERY — COLONOSCOPY
Anesthesia: Moderate Sedation

## 2016-08-01 MED ORDER — FENTANYL CITRATE (PF) 100 MCG/2ML IJ SOLN
INTRAMUSCULAR | Status: AC
  Filled 2016-08-01: qty 2

## 2016-08-01 MED ORDER — FINASTERIDE 5 MG PO TABS
5.0000 mg | ORAL_TABLET | Freq: Every day | ORAL | Status: DC
  Administered 2016-08-01 – 2016-08-02 (×2): 5 mg via ORAL
  Filled 2016-08-01 (×2): qty 1

## 2016-08-01 MED ORDER — FENTANYL CITRATE (PF) 100 MCG/2ML IJ SOLN
INTRAMUSCULAR | Status: DC | PRN
  Administered 2016-08-01 (×4): 25 ug via INTRAVENOUS

## 2016-08-01 MED ORDER — METOPROLOL SUCCINATE ER 25 MG PO TB24
25.0000 mg | ORAL_TABLET | Freq: Every day | ORAL | Status: DC
  Filled 2016-08-01: qty 1

## 2016-08-01 MED ORDER — SODIUM BICARBONATE 650 MG PO TABS
1300.0000 mg | ORAL_TABLET | Freq: Two times a day (BID) | ORAL | Status: DC
  Administered 2016-08-01 – 2016-08-02 (×2): 1300 mg via ORAL
  Filled 2016-08-01 (×2): qty 2

## 2016-08-01 MED ORDER — SODIUM CHLORIDE 0.9 % IJ SOLN
INTRAMUSCULAR | Status: DC | PRN
  Administered 2016-08-01: 8 mL
  Administered 2016-08-01: 10 mL

## 2016-08-01 MED ORDER — CYCLOPHOSPHAMIDE 25 MG PO TABS: 25.0000 mg | ORAL_TABLET | Freq: Every day | ORAL | Status: DC

## 2016-08-01 MED ORDER — PREDNISONE 5 MG PO TABS
7.5000 mg | ORAL_TABLET | Freq: Every day | ORAL | Status: DC
  Administered 2016-08-02: 7.5 mg via ORAL
  Filled 2016-08-01: qty 2

## 2016-08-01 MED ORDER — SPOT INK MARKER SYRINGE KIT
PACK | SUBMUCOSAL | Status: DC | PRN
  Administered 2016-08-01: 3.5 mL via SUBMUCOSAL

## 2016-08-01 MED ORDER — MIDAZOLAM HCL 5 MG/5ML IJ SOLN
INTRAMUSCULAR | Status: DC | PRN
  Administered 2016-08-01: 2 mg via INTRAVENOUS
  Administered 2016-08-01: 1 mg via INTRAVENOUS
  Administered 2016-08-01: 2 mg via INTRAVENOUS
  Administered 2016-08-01: 1 mg via INTRAVENOUS

## 2016-08-01 MED ORDER — METOPROLOL SUCCINATE ER 25 MG PO TB24: 25.0000 mg | ORAL_TABLET | Freq: Every day | ORAL | Status: DC

## 2016-08-01 MED ORDER — TAMSULOSIN HCL 0.4 MG PO CAPS
0.4000 mg | ORAL_CAPSULE | Freq: Every day | ORAL | Status: DC
  Administered 2016-08-01: 0.4 mg via ORAL
  Filled 2016-08-01: qty 1

## 2016-08-01 MED ORDER — MIDAZOLAM HCL 5 MG/ML IJ SOLN
INTRAMUSCULAR | Status: AC
  Filled 2016-08-01: qty 2

## 2016-08-01 MED ORDER — SPOT INK MARKER SYRINGE KIT
PACK | SUBMUCOSAL | Status: AC
  Filled 2016-08-01: qty 5

## 2016-08-01 NOTE — Op Note (Signed)
[facility] Patient Name: Richard [name] Procedure Date: 08/01/2016 MRN: [id number] Attending MD: Carol Ada , MD Date of Birth: January 25, 1936 CSN: [id number] Age: 80 Admit Type: Inpatient Procedure:                Colonoscopy Indications:              Melena Providers:                Carol Ada, MD, Dortha Schwalbe RN, RN,                            William Dalton, Technician Referring MD:              Medicines:                Midazolam 6 mg IV, Fentanyl 100 micrograms IV Complications:            No immediate complications. Estimated Blood Loss:     Estimated blood loss was minimal. Procedure:                Pre-Anesthesia Assessment:                           - Prior to the procedure, a History and Physical                            was performed, and patient medications and                            allergies were reviewed. The patient's tolerance of                            previous anesthesia was also reviewed. The risks                            and benefits of the procedure and the sedation                            options and risks were discussed with the patient.                            All questions were answered, and informed consent                            was obtained. Prior Anticoagulants: The patient has                            taken Plavix (clopidogrel), last dose was 3 days                            prior to procedure. ASA Grade Assessment: III - A                            patient with severe systemic disease. After  reviewing the risks and benefits, the patient was                            deemed in satisfactory condition to undergo the                            procedure.                           - Sedation was administered by an endoscopy nurse.                            The sedation level attained was moderate.                           After obtaining informed consent, the colonoscope                         was passed under direct vision. Throughout the                            procedure, the patient's blood pressure, pulse, and                            oxygen saturations were monitored continuously. The                            EC-[id number] scope was introduced through                            the anus and advanced to the the cecum, identified                            by appendiceal orifice and ileocecal valve. The                            colonoscopy was technically difficult and complex.                            The patient tolerated the procedure well. The                            quality of the bowel preparation was good. The                            ileocecal valve, appendiceal orifice, and rectum                            were photographed. Scope In: 12:42:52 PM Scope Out: 1:30:19 PM Scope Withdrawal Time: 0 hours 31 minutes 52 seconds  Total Procedure Duration: 0 hours 47 minutes 27 seconds  Findings:      Two sessile polyps were found in the hepatic flexure and cecum. The       polyps were 3 to 4 mm in size. These polyps were removed with a  cold       snare. Resection and retrieval were complete.      A 20 mm polyp was found in the hepatic flexure. The polyp was sessile.       The polyp was removed with a hot snare. The polyp was removed with a       saline injection-lift technique using a hot snare. The polyp was removed       with a piecemeal technique using a hot snare. Resection and retrieval       were complete. To prevent bleeding post-intervention, two hemostatic       clips were successfully placed (MR unsafe). There was no bleeding at the       end of the procedure. Area was tattooed with an injection of 3 mL of       Spot (carbon black).      A single large localized angiodysplastic lesion without bleeding was       found in the cecum. Coagulation for tissue destruction using monopolar       probe was successful.       Scattered small and large-mouthed diverticula were found in the entire       colon.      Good to excellent views of the mucosa were obtained with extensive       lavage. A large faint cecal AVM was found and ablated with APC. A large       2 cm sessile polyp overlying a fold was found around the hepatic       flexure. The lesion was lifted using an 8 ml aliquot and then 10 ml       aliquot injectiton to lift the lesion. It required piecemeal resection.       Because of some oozing post polypectomy and the level of difficulty with       removing the polyp, two hemoclips were placed to prevent any post       polypectomy bleeding. Impression:               - Two 3 to 4 mm polyps at the hepatic flexure and                            in the cecum, removed with a cold snare. Resected                            and retrieved.                           - One 20 mm polyp at the hepatic flexure, removed                            with a hot snare, removed using injection-lift and                            a hot snare and removed piecemeal using a hot                            snare. Resected and retrieved. Clips (MR unsafe)  were placed. Tattooed.                           - A single non-bleeding colonic angiodysplastic                            lesion. Treated with a monopolar probe.                           - Diverticulosis in the entire examined colon. Moderate Sedation:      Moderate (conscious) sedation was administered by the endoscopy nurse       and supervised by the endoscopist. The following parameters were       monitored: oxygen saturation, heart rate, blood pressure, and response       to care. Recommendation:           - Return patient to hospital ward for ongoing care.                           - Resume regular diet.                           - Continue present medications.                           - Await pathology results.                            - Repeat colonoscopy in 6 -12 months for                            surveillance, if appropriate.                           - Follow up with Dr. Fuller Plan in 2-4 weeks.                           - Resume Plavix in 3 days. Procedure Code(s):        --- Professional ---                           [id number], Colonoscopy, flexible; with ablation of                            tumor(s), polyp(s), or other lesion(s) (includes                            pre- and post-dilation and guide wire passage, when                            performed)                           [id number], 59, Colonoscopy, flexible; with removal of                            tumor(s), polyp(s), or  other lesion(s) by snare                            technique                           [phone], Colonoscopy, flexible; with directed                            submucosal injection(s), any substance Diagnosis Code(s):        --- Professional ---                           D12.3, Benign neoplasm of transverse colon (hepatic                            flexure or splenic flexure)                           D12.0, Benign neoplasm of cecum                           K55.20, Angiodysplasia of colon without hemorrhage                           K92.1, Melena (includes Hematochezia)                           K57.30, Diverticulosis of large intestine without                            perforation or abscess without bleeding CPT copyright 2016 [employer]. All rights reserved. The codes documented in this report are preliminary and upon coder review may  be revised to meet current compliance requirements. Carol Ada, MD Carol Ada, MD 08/01/2016 1:45:10 PM This report has been signed electronically. Number of Addenda: 0

## 2016-08-01 NOTE — Progress Notes (Signed)
PROGRESS NOTE    Richard [name]  [id number] DOB: [birth year] DOA: 07/30/2016 PCP: Richard Penna, MD    Brief Narrative:  Richard [name] is a 80 y.o. 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  Patient states that he had congestion and was producing yellow sputum slightly over a week ago.  He was given antibiotics to take if this happens and he started taking amoxicillin as he normally does when this happens.  He took this up until 2 days ago and he reported no improvement.  In fact he reports a worsening of dyspnea where he could not longer barely stand without getting short of breath.  This morning he went to the drug store and upon returning couldn't catch his breath.  It was at that time he decided to come to the ED.    He reports black tarry stools since taking the Amoxicillin and reports that it has never done that previously.  He has been getting weekly injections for anemia for the past year and received two shots 5 days prior to admission.  No chest pain, chest pressure, vomiting, dysuria, hematuria.  Patient has been having about 2 bowel movements every morning and reports all bowel movements have been black and tarry for the past couple of days.  Occasional abdominal pain located in the epigastric area and then in the lower rectal area after bowel movements.  Lives at home with his wife- able to ambulate and perform ADL's independently.   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   Chronic kidney disease, stage IV (severe) (HCC)   Aortic valve stenosis   Symptomatic anemia   GIB (gastrointestinal bleeding)   Symptomatic anemia 2/2 GIB - Gastroenterology consulted - 2 large bore IVs - blood transfusion ordered by ED physician -  patient is s/p 2 unit PRBC - EGD yesterday normal - colonoscopy today results below - hemoccult positive - continue PPI - regular diet - repeat H/H in am   CKD Stage IV 2/2 Wegener's granulomatosis - Cr appears to be at patients baseline (~2) - restart bicarb and cytoxan  Obstructive bronchiectasis - patient does not wear oxygen at any time outpatient - continue to monitor respiratory status  CAD - patient on plavix and aspirin at home - will need to hold these medications for time being - will transition back to PO metoprolol in am - restart Plavix in 3 days    DVT prophylaxis: SCD's due to GIB Code Status: Full Code Family Communication: no family is bedside Disposition Plan: Discharge back to previous home environment tomorrow if H/H stable    Consultants:   [facility]  Procedures:   EGD 12/24  Colonoscopy 12/25  Antimicrobials:   None    Subjective: Patient anxious to get colonoscopy done today as he would like to eat.  Says he is hungry but is also glad he will be finding out what is wrong with him.  Hopeful to go home if tests are reassuring and blood counts are stable.  Objective: Vitals:   08/01/16 1340 08/01/16 1345 08/01/16 1350 08/01/16 1410  BP: 128/77 140/60 94/68 121/74  Pulse: 78 78 78 85  Resp: 18 18 20 18   Temp:    97.9 F (36.6 C)  TempSrc:  Oral  SpO2: 98% 97% 97% 95%  Weight:      Height:        Intake/Output Summary (Last 24 hours) at 08/01/16 1535 Last data filed at 08/01/16 1300  Gross per 24 hour  Intake                0 ml  Output              275 ml  Net             -275 ml   Filed Weights   07/30/16 1358 07/30/16 1840  Weight: 55.3 kg (122 lb) 54.4 kg (119 lb 14.9 oz)    Examination:  General exam: Appears calm and comfortable  Respiratory system: Clear to auscultation. Respiratory effort normal. Cardiovascular system: S1 & S2 heard, RRR. No JVD, rubs, gallops or clicks.  III/VI systolic murmur heard  best at aortic area. No pedal edema. Gastrointestinal system: Abdomen is nondistended, soft and nontender in lower quadrants. No organomegaly or masses felt. Normal bowel sounds heard. Central nervous system: Alert and oriented. No focal neurological deficits. Extremities: Symmetric 5 x 5 power. Skin: No rashes, lesions or ulcers.  Skin darkening on bilateral dorsal forearms Psychiatry: Judgement and insight appear normal. Mood & affect appropriate.     Data Reviewed: I have personally reviewed following labs and imaging studies  CBC:  Recent Labs Lab 07/30/16 1455 07/30/16 1806 07/30/16 2219 07/31/16 0635 07/31/16 0942 07/31/16 1913  WBC 5.3 4.7 4.2 4.4 4.9 6.3  NEUTROABS 4.5  --   --   --   --   --   HGB 6.8* 6.8* 6.6* 7.9* 8.2* 11.0*  HCT 21.2* 20.7* 20.3* 23.8* 24.6* 32.6*  MCV 114.6* 113.1* 113.4* 106.3* 105.6* 101.9*  PLT 153 153 155 158 152 0000000   Basic Metabolic Panel:  Recent Labs Lab 07/30/16 1455 07/31/16 1913  NA 144 144  K 4.2 4.7  CL 112* 112*  CO2 25 26  GLUCOSE 105* 89  BUN 36* 26*  CREATININE 2.00* 2.02*  CALCIUM 8.2* 8.5*   GFR: Estimated Creatinine Clearance: 23.2 mL/min (by C-G formula based on SCr of 2.02 mg/dL (H)). Liver Function Tests:  Recent Labs Lab 07/30/16 1455  AST 16  ALT 13*  ALKPHOS 63  BILITOT 0.5  PROT 5.2*  ALBUMIN 3.1*   No results for input(s): LIPASE, AMYLASE in the last 168 hours. No results for input(s): AMMONIA in the last 168 hours. Coagulation Profile: No results for input(s): INR, PROTIME in the last 168 hours. Cardiac Enzymes: No results for input(s): CKTOTAL, CKMB, CKMBINDEX, TROPONINI in the last 168 hours. BNP (last 3 results) No results for input(s): PROBNP in the last 8760 hours. HbA1C: No results for input(s): HGBA1C in the last 72 hours. CBG: No results for input(s): GLUCAP in the last 168 hours. Lipid Profile: No results for input(s): CHOL, HDL, LDLCALC, TRIG, CHOLHDL, LDLDIRECT in the last 72  hours. Thyroid Function Tests: No results for input(s): TSH, T4TOTAL, FREET4, T3FREE, THYROIDAB in the last 72 hours. Anemia Panel:  Recent Labs  07/31/16 1913  VITAMINB12 565  FOLATE 33.5  FERRITIN 331  TIBC 189*  IRON 51  RETICCTPCT 3.0   Sepsis Labs:  Recent Labs Lab 07/30/16 1509 07/30/16 1818  LATICACIDVEN 0.95 0.61    No results found for this or any previous visit (from the past 240 hour(s)).       Radiology Studies: No results found.      Scheduled Meds: .  sodium chloride   Intravenous Once  . metoprolol  2.5 mg Intravenous QPM   Continuous Infusions:    LOS: 2 days    Time spent: 30 minutes    Loretha Stapler, MD [facility] Pager [phone]  If 7PM-7AM, please contact night-coverage [email or web address] Password [email or web address] 08/01/2016, 3:35 PM

## 2016-08-02 ENCOUNTER — Encounter (HOSPITAL_COMMUNITY): Payer: Self-pay | Admitting: Gastroenterology

## 2016-08-02 ENCOUNTER — Other Ambulatory Visit: Payer: Self-pay

## 2016-08-02 ENCOUNTER — Encounter (HOSPITAL_COMMUNITY): Payer: Medicare Other

## 2016-08-02 DIAGNOSIS — Q2733 Arteriovenous malformation of digestive system vessel: Secondary | ICD-10-CM

## 2016-08-02 DIAGNOSIS — K921 Melena: Principal | ICD-10-CM

## 2016-08-02 DIAGNOSIS — N184 Chronic kidney disease, stage 4 (severe): Secondary | ICD-10-CM

## 2016-08-02 DIAGNOSIS — D62 Acute posthemorrhagic anemia: Secondary | ICD-10-CM

## 2016-08-02 DIAGNOSIS — I2510 Atherosclerotic heart disease of native coronary artery without angina pectoris: Secondary | ICD-10-CM

## 2016-08-02 DIAGNOSIS — K5521 Angiodysplasia of colon with hemorrhage: Secondary | ICD-10-CM

## 2016-08-02 DIAGNOSIS — K922 Gastrointestinal hemorrhage, unspecified: Secondary | ICD-10-CM

## 2016-08-02 LAB — TYPE AND SCREEN
ABO/RH(D): O POS
Antibody Screen: NEGATIVE
UNIT DIVISION: 0
Unit division: 0
Unit division: 0
Unit division: 0
Unit division: 0

## 2016-08-02 LAB — URINALYSIS, ROUTINE W REFLEX MICROSCOPIC
Bilirubin Urine: NEGATIVE
GLUCOSE, UA: NEGATIVE mg/dL
HGB URINE DIPSTICK: NEGATIVE
KETONES UR: 5 mg/dL — AB
Leukocytes, UA: NEGATIVE
Nitrite: NEGATIVE
PROTEIN: NEGATIVE mg/dL
Specific Gravity, Urine: 1.014 (ref 1.005–1.030)
pH: 5 (ref 5.0–8.0)

## 2016-08-02 MED ORDER — ASPIRIN EC 81 MG PO TBEC: 81.0000 mg | DELAYED_RELEASE_TABLET | Freq: Every day | ORAL | 1 refills | Status: DC

## 2016-08-02 MED ORDER — DARBEPOETIN ALFA 150 MCG/0.3ML IJ SOSY
150.0000 ug | PREFILLED_SYRINGE | Freq: Once | INTRAMUSCULAR | Status: AC
Start: 2016-08-02 — End: 2016-08-02
  Administered 2016-08-02: 150 ug via SUBCUTANEOUS
  Filled 2016-08-02: qty 0.3

## 2016-08-02 MED ORDER — AMOXICILLIN-POT CLAVULANATE 500-125 MG PO TABS: 1.0000 | ORAL_TABLET | Freq: Two times a day (BID) | ORAL | 0 refills | Status: AC

## 2016-08-02 MED ORDER — CLOPIDOGREL BISULFATE 75 MG PO TABS: 75.0000 mg | ORAL_TABLET | Freq: Every day | ORAL | 3 refills | Status: DC

## 2016-08-02 MED ORDER — PANTOPRAZOLE SODIUM 40 MG PO TBEC: 40.0000 mg | DELAYED_RELEASE_TABLET | Freq: Every day | ORAL | 3 refills | Status: DC

## 2016-08-02 MED ORDER — AMOXICILLIN-POT CLAVULANATE 500-125 MG PO TABS
1.0000 | ORAL_TABLET | Freq: Two times a day (BID) | ORAL | Status: DC
  Administered 2016-08-02: 500 mg via ORAL
  Filled 2016-08-02: qty 1

## 2016-08-02 NOTE — Discharge Summary (Addendum)
Physician Discharge Summary  Richard [name] MRN: [id number] DOB/AGE: 12/28/1935 80 y.o.  PCP: Alysia Penna, MD   Admit date: 07/30/2016 Discharge date: 08/02/2016  Discharge Diagnoses:    Active Problems:   Wegener's granulomatosis ([place])   Obstructive bronchiectasis (HCC)   Chronic respiratory failure (HCC)   HBP (high blood pressure)   CAD- severe 4 V CAD- not CABG candidate   Chronic kidney disease, stage IV (severe) (HCC)   Aortic valve stenosis   Symptomatic anemia   GIB (gastrointestinal bleeding)    Follow-up recommendations Follow-up with PCP in 3-5 days , including all  additional recommended appointments as below Follow-up CBC, CMP in 3-5 days Recommendation:                                     - Resume regular diet.                           - Continue present medications.                           - Await pathology results.                           - Repeat colonoscopy in 6 -12 months for                            surveillance, if appropriate.                           - Follow up with Dr. Fuller Plan in 2-4 weeks.                           - Resume Plavix in 3 days.     Current Discharge Medication List    START taking these medications   Details  amoxicillin-clavulanate (AUGMENTIN) 500-125 MG tablet Take 1 tablet (500 mg total) by mouth 2 (two) times daily. Qty: 10 tablet, Refills: 0    pantoprazole (PROTONIX) 40 MG tablet Take 1 tablet (40 mg total) by mouth daily. Qty: 30 tablet, Refills: 3      CONTINUE these medications which have CHANGED   Details  aspirin EC 81 MG tablet Take 1 tablet (81 mg total) by mouth daily. Qty: 30 tablet, Refills: 1    clopidogrel (PLAVIX) 75 MG tablet Take 1 tablet (75 mg total) by mouth daily. TAKE ONE TABLET BY MOUTH ONCE DAILY Qty: 90 tablet, Refills: 3      CONTINUE these medications which have NOT CHANGED   Details  Acetaminophen (TYLENOL) 325 MG CAPS Take 325 mg by mouth every 6 (six) hours as needed (pain).  TAKE PER BOX AS NEEDED FOR PAIN     albuterol (VENTOLIN HFA) 108 (90 Base) MCG/ACT inhaler Inhale 1-2 puffs into the lungs every 4 (four) hours as needed for wheezing or shortness of breath. Qty: 1 Inhaler, Refills: 2    b complex vitamins tablet Take 1 tablet by mouth daily.      bisacodyl (DULCOLAX) 5 MG EC tablet Take 5 mg by mouth 2 (two) times daily.     calcitRIOL (ROCALTROL) 0.25 MCG capsule Take 0.25 mcg by mouth daily.  Refills: 4  cyclophosphamide (CYTOXAN) 25 MG capsule Take 25 mg by mouth daily. Take one capsule by mouth daily on an empty stomach 1 hour before or 2 hours after meal. Refills: 0    dextromethorphan-guaiFENesin (MUCINEX DM) 30-600 MG 12hr tablet Take 1 tablet by mouth 2 (two) times daily.    docusate sodium (COLACE) 100 MG capsule Take 100 mg by mouth 2 (two) times daily.    finasteride (PROSCAR) 5 MG tablet TAKE ONE TABLET BY MOUTH ONCE DAILY Qty: 90 tablet, Refills: 0    ipratropium-albuterol (DUONEB) 0.5-2.5 (3) MG/3ML SOLN Take 3 mLs by nebulization every 4 (four) hours as needed (if inhaler is not effective in resolving shortness of breath or wheezing). Qty: 360 mL, Refills: 11    loratadine (CLARITIN) 10 MG tablet Take 10 mg by mouth daily.     metoprolol succinate (TOPROL-XL) 25 MG 24 hr tablet Take 1 tablet (25 mg total) by mouth daily. Qty: 90 tablet, Refills: 1    nitroGLYCERIN (NITROSTAT) 0.4 MG SL tablet Place 1 tablet (0.4 mg total) under the tongue every 5 (five) minutes x 3 doses as needed for chest pain. Qty: 25 tablet, Refills: 12    predniSONE (DELTASONE) 5 MG tablet Take 7.5 mg by mouth daily with breakfast.    ranitidine (ZANTAC) 300 MG capsule Take 1 capsule (300 mg total) by mouth 2 (two) times daily. This is the correct rx, d/c qd rx Qty: 60 capsule, Refills: 11    simethicone (MYLICON) 725 MG chewable tablet Chew 125 mg by mouth every 6 (six) hours as needed for flatulence. TAKE PER BOX FOR GAS     simvastatin (ZOCOR) 20 MG  tablet TAKE ONE TABLET BY MOUTH ONCE DAILY AT  6  PM Qty: 90 tablet, Refills: 3    sodium bicarbonate 650 MG tablet Take 2 tablets (1,300 mg total) by mouth 2 (two) times daily. NEED OV. Qty: 120 tablet, Refills: 1    SYMBICORT 160-4.5 MCG/ACT inhaler INHALE TWO PUFFS BY MOUTH TWICE DAILY Qty: 3 Inhaler, Refills: 3    tamsulosin (FLOMAX) 0.4 MG CAPS capsule TAKE ONE CAPSULE BY MOUTH ONCE DAILY IN THE EVENING Qty: 30 capsule, Refills: 5        Discharge Condition: *Stable Discharge Instructions Get Medicines reviewed and adjusted: Please take all your medications with you for your next visit with your Primary MD  Please request your Primary MD to go over all hospital tests and procedure/radiological results at the follow up, please ask your Primary MD to get all Hospital records sent to his/her office.  If you experience worsening of your admission symptoms, develop shortness of breath, life threatening emergency, suicidal or homicidal thoughts you must seek medical attention immediately by calling 911 or calling your MD immediately if symptoms less severe.  You must read complete instructions/literature along with all the possible adverse reactions/side effects for all the Medicines you take and that have been prescribed to you. Take any new Medicines after you have completely understood and accpet all the possible adverse reactions/side effects.   Do not drive when taking Pain medications.   Do not take more than prescribed Pain, Sleep and Anxiety Medications  Special Instructions: If you have smoked or chewed Tobacco in the last 2 yrs please stop smoking, stop any regular Alcohol and or any Recreational drug use.  Wear Seat belts while driving.  Please note  You were cared for by a hospitalist during your hospital stay. Once you are discharged, your primary care physician will  handle any further medical issues. Please note that NO REFILLS for any discharge medications will be  authorized once you are discharged, as it is imperative that you return to your primary care physician (or establish a relationship with a primary care physician if you do not have one) for your aftercare needs so that they can reassess your need for medications and monitor your lab values.     No Known Allergies    Disposition: 01-Home or Self Care   Consults:  GI   Significant Diagnostic Studies:  Dg Chest 2 View  Result Date: 07/30/2016 CLINICAL DATA:  Shortness of breath with exertion EXAM: CHEST  2 VIEW COMPARISON:  06/09/2016 FINDINGS: Cardiac shadow is stable. Hiatal hernia is again seen and stable. Patchy fibrotic changes are noted bilaterally stable from the prior exam. Bronchial dilatation consistent with bronchiectasis seen previously is noted. No acute bony abnormality is seen. IMPRESSION: Chronic changes without acute abnormality. Electronically Signed   By: Inez Catalina M.D.   On: 07/30/2016 15:06   Ct Chest Wo Contrast  Result Date: 07/30/2016 CLINICAL DATA:  Weakness and shortness of Breath EXAM: CT CHEST WITHOUT CONTRAST TECHNIQUE: Multidetector CT imaging of the chest was performed following the standard protocol without IV contrast. COMPARISON:  01/07/2016 FINDINGS: Cardiovascular: Somewhat limited by the lack of IV contrast. Diffuse aortic calcifications are seen without aneurysmal dilatation. Heavy coronary calcifications are seen. The cardiac structures are otherwise within normal limits. Mediastinum/Nodes: Large hiatal hernia is again identified. No significant hilar or mediastinal adenopathy is noted. The thoracic inlet is within normal limits. Lungs/Pleura: Changes of bronchiectasis are again seen in the lower lobes bilaterally. The degree of mucous plugging has improved significantly in the interval from the prior exam. Chronic fibrotic changes are seen bilaterally. Some stable areas of nodularity are noted particularly in the right lower lobe but to a lesser degree  in the right upper lobe. These are stable in appearance from the prior exam. Continued follow-up. Upper Abdomen:  No acute abnormality noted. Musculoskeletal: No chest wall mass or suspicious bone lesions identified. IMPRESSION: Changes of bronchiectasis predominately in the lower lobes bilaterally. The degree of mucous plugging has improved from the prior exam. Persistent postinflammatory changes in the right lower lobe and to a lesser degree in right upper lobe stable from the previous exam. No new focal abnormality is seen. Large stable hiatal hernia. Electronically Signed   By: Inez Catalina M.D.   On: 07/30/2016 15:38    EGD Impression:               - Benign-appearing esophageal stenosis.                           - 5 cm hiatal hernia.                           - Normal stomach.                           - Normal examined duodenum.                           - No specimens collected  Colonoscopy   - Two 3 to 4 mm polyps at the hepatic flexure and  in the cecum, removed with a cold snare. Resected                            and retrieved.                           - One 20 mm polyp at the hepatic flexure, removed                            with a hot snare, removed using injection-lift and                            a hot snare and removed piecemeal using a hot                            snare. Resected and retrieved. Clips (MR unsafe)                            were placed. Tattooed.                           - A single non-bleeding colonic angiodysplastic                            lesion. Treated with a monopolar probe.                           - Diverticulosis in the entire examined colon.   Filed Weights   07/30/16 1358 07/30/16 1840  Weight: 55.3 kg (122 lb) 54.4 kg (119 lb 14.9 oz)     Microbiology: No results found for this or any previous visit (from the past 240 hour(s)).     Blood Culture    Component Value Date/Time   SDES URINE, RANDOM  01/07/2016 1541   SPECREQUEST NONE 01/07/2016 1541   CULT NO GROWTH Performed at [facility]  01/07/2016 1541   REPTSTATUS 01/09/2016 FINAL 01/07/2016 1541      Labs: Results for orders placed or performed during the hospital encounter of 07/30/16 (from the past 48 hour(s))  CBC     Status: Abnormal   Collection Time: 07/31/16  9:42 AM  Result Value Ref Range   WBC 4.9 4.0 - 10.5 K/uL   RBC 2.33 (L) 4.22 - 5.81 MIL/uL   Hemoglobin 8.2 (L) 13.0 - 17.0 g/dL   HCT 24.6 (L) 39.0 - 52.0 %   MCV 105.6 (H) 78.0 - 100.0 fL   [name] 35.2 (H) 26.0 - 34.0 pg   MCHC 33.3 30.0 - 36.0 g/dL   RDW 20.2 (H) 11.5 - 15.5 %   Platelets 152 150 - 400 K/uL  Prepare RBC     Status: None   Collection Time: 07/31/16  9:43 AM  Result Value Ref Range   Order Confirmation ORDER PROCESSED BY BLOOD BANK   CBC     Status: Abnormal   Collection Time: 07/31/16  7:13 PM  Result Value Ref Range   WBC 6.3 4.0 - 10.5 K/uL   RBC 3.20 (L) 4.22 - 5.81 MIL/uL   Hemoglobin 11.0 (L) 13.0 - 17.0 g/dL    Comment: POST  TRANSFUSION SPECIMEN DELTA CHECK NOTED    HCT 32.6 (L) 39.0 - 52.0 %   MCV 101.9 (H) 78.0 - 100.0 fL   [name] 34.4 (H) 26.0 - 34.0 pg   MCHC 33.7 30.0 - 36.0 g/dL   Platelets 175 150 - 400 K/uL  Vitamin B12     Status: None   Collection Time: 07/31/16  7:13 PM  Result Value Ref Range   Vitamin B-12 565 180 - 914 pg/mL    Comment: (NOTE) This assay is not validated for testing neonatal or myeloproliferative syndrome specimens for Vitamin B12 levels. Performed at [facility]   Folate     Status: None   Collection Time: 07/31/16  7:13 PM  Result Value Ref Range   Folate 33.5 >5.9 ng/mL    Comment: Performed at [facility]  Iron and TIBC     Status: Abnormal   Collection Time: 07/31/16  7:13 PM  Result Value Ref Range   Iron 51 45 - 182 ug/dL   TIBC 189 (L) 250 - 450 ug/dL   Saturation Ratios 27 17.9 - 39.5 %   UIBC 138 ug/dL    Comment: Performed at [facility]   Ferritin     Status: None   Collection Time: 07/31/16  7:13 PM  Result Value Ref Range   Ferritin 331 24 - 336 ng/mL    Comment: Performed at [facility]  Reticulocytes     Status: Abnormal   Collection Time: 07/31/16  7:13 PM  Result Value Ref Range   Retic Ct Pct 3.0 0.4 - 3.1 %   RBC. 3.20 (L) 4.22 - 5.81 MIL/uL   Retic Count, Manual 96.0 19.0 - 186.0 K/uL  Basic metabolic panel     Status: Abnormal   Collection Time: 07/31/16  7:13 PM  Result Value Ref Range   Sodium 144 135 - 145 mmol/L   Potassium 4.7 3.5 - 5.1 mmol/L   Chloride 112 (H) 101 - 111 mmol/L   CO2 26 22 - 32 mmol/L   Glucose, Bld 89 65 - 99 mg/dL   BUN 26 (H) 6 - 20 mg/dL   Creatinine, Ser 2.02 (H) 0.61 - 1.24 mg/dL   Calcium 8.5 (L) 8.9 - 10.3 mg/dL   GFR calc non Af Amer 30 (L) >60 mL/min   GFR calc Af Amer 35 (L) >60 mL/min    Comment: (NOTE) The eGFR has been calculated using the CKD EPI equation. This calculation has not been validated in all clinical situations. eGFR's persistently <60 mL/min signify possible Chronic Kidney Disease.    Anion gap 6 5 - 15  Basic metabolic panel     Status: Abnormal   Collection Time: 08/01/16  6:32 PM  Result Value Ref Range   Sodium 139 135 - 145 mmol/L   Potassium 3.6 3.5 - 5.1 mmol/L    Comment: DELTA CHECK NOTED REPEATED TO VERIFY    Chloride 110 101 - 111 mmol/L   CO2 22 22 - 32 mmol/L   Glucose, Bld 118 (H) 65 - 99 mg/dL   BUN 31 (H) 6 - 20 mg/dL   Creatinine, Ser 1.77 (H) 0.61 - 1.24 mg/dL   Calcium 7.7 (L) 8.9 - 10.3 mg/dL   GFR calc non Af Amer 35 (L) >60 mL/min   GFR calc Af Amer 41 (L) >60 mL/min    Comment: (NOTE) The eGFR has been calculated using the CKD EPI equation. This calculation has not been validated in all  clinical situations. eGFR's persistently <60 mL/min signify possible Chronic Kidney Disease.    Anion gap 7 5 - 15  Magnesium     Status: None   Collection Time: 08/01/16  6:32 PM  Result Value Ref Range   Magnesium 2.1  1.7 - 2.4 mg/dL  CBC     Status: Abnormal   Collection Time: 08/01/16  6:32 PM  Result Value Ref Range   WBC 4.8 4.0 - 10.5 K/uL   RBC 3.10 (L) 4.22 - 5.81 MIL/uL   Hemoglobin 10.8 (L) 13.0 - 17.0 g/dL   HCT 31.6 (L) 39.0 - 52.0 %   MCV 101.9 (H) 78.0 - 100.0 fL   [name] 34.8 (H) 26.0 - 34.0 pg   MCHC 34.2 30.0 - 36.0 g/dL   RDW 20.2 (H) 11.5 - 15.5 %   Platelets 170 150 - 400 K/uL     Lipid Panel     Component Value Date/Time   CHOL 103 (L) 03/24/2015 0919   TRIG 97 03/24/2015 0919   HDL 45 03/24/2015 0919   CHOLHDL 2.3 03/24/2015 0919   VLDL 19 03/24/2015 0919   LDLCALC 39 03/24/2015 0919     Lab Results  Component Value Date   HGBA1C 5.3 03/13/2014   HGBA1C  04/10/2010    5.6 (NOTE)                                                                       According to the ADA Clinical Practice Recommendations for 2011, when HbA1c is used as a screening test:   >=6.5%   Diagnostic of Diabetes Mellitus           (if abnormal result  is confirmed)  5.7-6.4%   Increased risk of developing Diabetes Mellitus  References:Diagnosis and Classification of Diabetes Mellitus,Diabetes WUXL,2440,10(UVOZD 1):S62-S69 and Standards of Medical Care in         Diabetes - 2011,Diabetes Care,2011,34  (Suppl 1):S11-S61.        HPI :*  80 y.o.malewith medical history significant of CAD, CKD 2/2 Wegener's Granulomatosis presents for dyspnea and weakness. Patient states that he had congestion and was producing yellow sputum slightly over a week ago. He was given antibiotics to take if this happens and he started taking amoxicillin as he normally does when this happens. He took this up until 2 days ago and he reported no improvement. In fact he reports a worsening of dyspnea where he could not longer barely stand without getting short of breath. This morning he went to the drug store and upon returning couldn't catch his breath. It was at that time he decided to come to the ED.   He reports  black tarry stools since taking the Amoxicillin and reports that it has never done that previously. He has been getting weekly injections for anemia for the past year and received two shots 5 days prior to admission. No chest pain, chest pressure, vomiting, dysuria, hematuria. Patient has been having about 2 bowel movements every morning and reports all bowel movements have been black and tarry for the past couple of days. Occasional abdominal pain located in the epigastric area and then in the lower rectal area after bowel movements. Lives at home with his wife- able  to ambulate and perform ADL's independently  HOSPITAL COURSE: *  Symptomatic anemia 2/2 GIB - Gastroenterology consulted  patient is s/p 2 unit PRBC. Hemoglobin 10.8 prior to discharge - EGD/colonoscopy results as above Continue PPI, resume Plavix in 3 days, resume aspirin 7 days  Fever/cough CT chest 12/23 showed bronchiectasis Patient was receiving amoxicillin prior to this admission Will continue with Augmentin for acute bronchitis for another 5 days   CKD Stage IV 2/2 Wegener's granulomatosis - Cr appears to be at patients baseline (~2) - restart bicarb and cytoxan Creatinine 1.77 on the day of discharge  Obstructive bronchiectasis - patient does not wear oxygen at any time outpatient - continue to monitor respiratory status  CAD - patient on plavix and aspirin at home - will need to hold these medications for time being - will transition back to PO metoprolol in am - restart Plavix in 3 days  Discharge Exam:  Blood pressure 103/60, pulse (!) 108, temperature 98.6 F (37 C), temperature source Oral, resp. rate 16, height 5' 7" (1.702 m), weight 54.4 kg (119 lb 14.9 oz), SpO2 95 %.  General exam: Appears calm and comfortable  Respiratory system: Clear to auscultation. Respiratory effort normal. Cardiovascular system: S1 & S2 heard, RRR. No JVD, rubs, gallops or clicks.  III/VI systolic murmur heard best  at aortic area. No pedal edema. Gastrointestinal system: Abdomen is nondistended, soft and nontender in lower quadrants. No organomegaly or masses felt. Normal bowel sounds heard. Central nervous system: Alert and oriented. No focal neurological deficits. Extremities: Symmetric 5 x 5 power. Skin: No rashes, lesions or ulcers.  Skin darkening on bilateral dorsal forearms Psychiatry: Judgement and insight appear normal. Mood & affect appropriate.     Follow-up Information    Alysia Penna, MD. Call in 2 day(s).   Specialty:  Family Medicine Why:  Hospital follow-up. Check CBC, BMP Contact information: [place] [address] [phone]        Pricilla Riffle. Fuller Plan, MD. Call.   Specialty:  Gastroenterology Why:  Hospital follow-up Contact information: [address] [address] [phone]           Signed: Reyne Dumas 08/02/2016, 8:00 AM        Time spent >45 mins

## 2016-08-02 NOTE — Telephone Encounter (Signed)
lmtcb x2 for pt. 

## 2016-08-02 NOTE — Progress Notes (Signed)
Patient ID: EMANUELLE FOLWELL, male   DOB: Jan 05, 1936, 80 y.o.   MRN: [id number]    Progress Note   Subjective  Pt is being discharged today - feels fine , no bleeding HGB 10 .8     Objective   Vital signs in last 24 hours: Temp:  [97.9 F (36.6 C)-100.6 F (38.1 C)] 98.6 F (37 C) (12/26 0541) Pulse Rate:  [69-108] 108 (12/26 0541) Resp:  [13-25] 16 (12/26 0541) BP: (94-150)/(52-104) 103/60 (12/26 0541) SpO2:  [95 %-100 %] 95 % (12/26 0541)   General:    Elderly WM in NAD Heart:  Regular rate and rhythm; no murmurs Lungs: Respirations even and unlabored, lungs CTA bilaterally Abdomen:  Soft, nontender and nondistended. Normal bowel sounds. Extremities:  Without edema. Neurologic:  Alert and oriented,  grossly normal neurologically. Psych:  Cooperative. Normal mood and affect.  Intake/Output from previous day: 12/25 0701 - 12/26 0700 In: 120 [P.O.:120] Out: 625 [Urine:625] Intake/Output this shift: No intake/output data recorded.  Lab Results:  Recent Labs  07/31/16 0942 07/31/16 1913 08/01/16 1832  WBC 4.9 6.3 4.8  HGB 8.2* 11.0* 10.8*  HCT 24.6* 32.6* 31.6*  PLT 152 175 170   BMET  Recent Labs  07/30/16 1455 07/31/16 1913 08/01/16 1832  NA 144 144 139  K 4.2 4.7 3.6  CL 112* 112* 110  CO2 25 26 22   GLUCOSE 105* 89 118*  BUN 36* 26* 31*  CREATININE 2.00* 2.02* 1.77*  CALCIUM 8.2* 8.5* 7.7*   LFT  Recent Labs  07/30/16 1455  PROT 5.2*  ALBUMIN 3.1*  AST 16  ALT 13*  ALKPHOS 63  BILITOT 0.5   PT/INR No results for input(s): LABPROT, INR in the last 72 hours.  Studies/Results: No results found.  Imp/plan;  #1 80 YO wm with acute GI bleed in setting of Plavix EGD negative  Colonoscopy yesterday with finding of large Cecal AVM- which was treated with APC, 2 small polyps removed and one large 2 cm polyp removed piecemeal from hepatic flexure and endo clipped to prevent bleeding  #2 anemia -acute on chronic -stable post  transfusions Pt also on EPO as oupt per renal weekly  #3 severe CAD -s/p stents #4 CKD -stage IV  Plan;  To be discharged today  Leave off Plavix x 5 days  He will come to our office in one week to repeat CBC He is aware to come back to hospital  for recurrent bleeding Will arrange out pt follow up with Dr Fuller Plan in 4 weeks  [place]  Active Problems:   Wegener's granulomatosis ([place])   Obstructive bronchiectasis ([place])   Chronic respiratory failure ([place])   HBP (high blood pressure)   CAD- severe 4 V CAD- not CABG candidate   Chronic kidney disease, stage IV (severe) (HCC)   Aortic valve stenosis   Symptomatic anemia   GIB (gastrointestinal bleeding)     LOS: 3 days   Amy Esterwood  08/02/2016, 9:49 AM

## 2016-08-03 ENCOUNTER — Telehealth: Payer: Self-pay

## 2016-08-03 LAB — HEMOCCULT SLIDES (X 3 CARDS)

## 2016-08-03 NOTE — Telephone Encounter (Signed)
LMTCB

## 2016-08-04 MED ORDER — CIPROFLOXACIN HCL 750 MG PO TABS: 750.0000 mg | ORAL_TABLET | Freq: Two times a day (BID) | ORAL | 0 refills | Status: DC

## 2016-08-04 NOTE — Telephone Encounter (Signed)
D/C 08/02/16 To: home  Spoke with pt and he states that he is doing well. He does not feel much improved since he has been home. He is taking abx and other medications as prescribed. He is waiting to hear from Dr [name] as to changing abx to Cipro. No other questions or concerns.   Appt scheduled with Dr Sarajane Jews 08/11/16. Pt aware.    Transition Care Management Follow-up Telephone Call  How have you been since you were released from the hospital? fine   Do you understand why you were in the hospital? yes   Do you understand the discharge instrcutions? yes  Items Reviewed:  Medications reviewed: yes  Allergies reviewed: yes  Dietary changes reviewed: yes  Referrals reviewed: yes   Functional Questionnaire:   Activities of Daily Living (ADLs):   He states they are independent in the following: ambulation, bathing and hygiene, feeding, continence, grooming, toileting and dressing States they require assistance with the following: none   Any transportation issues/concerns?: no   Any patient concerns? no   Confirmed importance and date/time of follow-up visits scheduled: yes   Confirmed with patient if condition begins to worsen call PCP or go to the ER.  Patient was given the Call-a-Nurse line [phone]: yes

## 2016-08-04 NOTE — Telephone Encounter (Signed)
Called and spoke with pt and he is aware of MW recs for the cipro.  Pt is aware to stop the amox and take the cipro with food, yogurt and probiotic daily.  Pt voiced his understanding and nothing further is needed.

## 2016-08-04 NOTE — Telephone Encounter (Signed)
Called and spoke with pt and he stated that he has been in the hospital for the last couple of days.  He was started on amox while in the hospital.  He stated that he was given cipro in the past, and it did not turn out well, but he is willing to take it again if needed.  MW please advise. Thanks  No Known Allergies

## 2016-08-04 NOTE — Telephone Encounter (Signed)
rx cipro as originally rec as amox won't help pseudomonas

## 2016-08-09 ENCOUNTER — Other Ambulatory Visit (INDEPENDENT_AMBULATORY_CARE_PROVIDER_SITE_OTHER): Payer: Medicare Other

## 2016-08-09 DIAGNOSIS — K922 Gastrointestinal hemorrhage, unspecified: Secondary | ICD-10-CM | POA: Diagnosis not present

## 2016-08-09 DIAGNOSIS — D62 Acute posthemorrhagic anemia: Secondary | ICD-10-CM | POA: Diagnosis not present

## 2016-08-09 LAB — CBC WITH DIFFERENTIAL/PLATELET
BASOS ABS: 0 10*3/uL (ref 0.0–0.1)
Basophils Relative: 0.1 % (ref 0.0–3.0)
Eosinophils Absolute: 0.1 10*3/uL (ref 0.0–0.7)
Eosinophils Relative: 0.7 % (ref 0.0–5.0)
HEMATOCRIT: 33.1 % — AB (ref 39.0–52.0)
HEMOGLOBIN: 11.1 g/dL — AB (ref 13.0–17.0)
Lymphs Abs: 0.4 10*3/uL — ABNORMAL LOW (ref 0.7–4.0)
MCHC: 33.5 g/dL (ref 30.0–36.0)
MCV: 103.6 fl — AB (ref 78.0–100.0)
MONOS PCT: 8.4 % (ref 3.0–12.0)
Monocytes Absolute: 0.6 10*3/uL (ref 0.1–1.0)
NEUTROS ABS: 6.2 10*3/uL (ref 1.4–7.7)
Neutrophils Relative %: 85 % — ABNORMAL HIGH (ref 43.0–77.0)
Platelets: 207 10*3/uL (ref 150.0–400.0)
RBC: 3.2 Mil/uL — ABNORMAL LOW (ref 4.22–5.81)
RDW: 19.7 % — ABNORMAL HIGH (ref 11.5–15.5)
WBC: 7.2 10*3/uL (ref 4.0–10.5)

## 2016-08-10 ENCOUNTER — Encounter (HOSPITAL_COMMUNITY)
Admission: RE | Admit: 2016-08-10 | Discharge: 2016-08-10 | Disposition: A | Discharge status: Home / Self Care | Payer: Medicare Other | Source: Ambulatory Visit | Attending: Nephrology | Admitting: Nephrology

## 2016-08-10 DIAGNOSIS — Z5181 Encounter for therapeutic drug level monitoring: Secondary | ICD-10-CM | POA: Insufficient documentation

## 2016-08-10 DIAGNOSIS — N183 Chronic kidney disease, stage 3 (moderate): Secondary | ICD-10-CM | POA: Insufficient documentation

## 2016-08-10 DIAGNOSIS — D631 Anemia in chronic kidney disease: Secondary | ICD-10-CM | POA: Diagnosis not present

## 2016-08-10 DIAGNOSIS — N184 Chronic kidney disease, stage 4 (severe): Secondary | ICD-10-CM

## 2016-08-10 DIAGNOSIS — Z79899 Other long term (current) drug therapy: Secondary | ICD-10-CM | POA: Insufficient documentation

## 2016-08-10 MED ORDER — EPOETIN ALFA 20000 UNIT/ML IJ SOLN
INTRAMUSCULAR | Status: AC
  Administered 2016-08-10: 14:00:00 20000 [IU]
  Filled 2016-08-10: qty 1

## 2016-08-10 MED ORDER — EPOETIN ALFA 40000 UNIT/ML IJ SOLN: 30000.0000 [IU] | INTRAMUSCULAR | Status: DC

## 2016-08-10 MED ORDER — EPOETIN ALFA 10000 UNIT/ML IJ SOLN
INTRAMUSCULAR | Status: AC
  Administered 2016-08-10: 14:00:00 10000 [IU] via SUBCUTANEOUS
  Filled 2016-08-10: qty 1

## 2016-08-11 ENCOUNTER — Ambulatory Visit: Payer: Medicare Other | Admitting: Family Medicine

## 2016-08-11 LAB — POCT HEMOGLOBIN-HEMACUE: HEMOGLOBIN: 11.3 g/dL — AB (ref 13.0–17.0)

## 2016-08-12 ENCOUNTER — Ambulatory Visit (INDEPENDENT_AMBULATORY_CARE_PROVIDER_SITE_OTHER): Payer: Medicare Other | Admitting: Family Medicine

## 2016-08-12 ENCOUNTER — Encounter: Payer: Self-pay | Admitting: Family Medicine

## 2016-08-12 VITALS — BP 115/63 | HR 86 | Temp 98.5°F | Ht 67.0 in | Wt 122.0 lb

## 2016-08-12 DIAGNOSIS — R531 Weakness: Secondary | ICD-10-CM

## 2016-08-12 DIAGNOSIS — N184 Chronic kidney disease, stage 4 (severe): Secondary | ICD-10-CM

## 2016-08-12 DIAGNOSIS — D649 Anemia, unspecified: Secondary | ICD-10-CM | POA: Diagnosis not present

## 2016-08-12 DIAGNOSIS — K921 Melena: Secondary | ICD-10-CM

## 2016-08-12 DIAGNOSIS — J9611 Chronic respiratory failure with hypoxia: Secondary | ICD-10-CM

## 2016-08-12 NOTE — Progress Notes (Signed)
Pre visit review using our clinic review tool, if applicable. No additional management support is needed unless otherwise documented below in the visit note. 

## 2016-08-12 NOTE — Progress Notes (Signed)
   Subjective:    Patient ID: Richard [name], male    DOB: 19-Aug-1935, 81 y.o.   MRN: [id number]  HPI Here for a transitional care management visit after a hospital stay from 07-30-16 to 08-02-16 for generalized weakness and SOB. On admission his Hgb was found to be 6.8, and he was transfused some PRBC. He was worked up for a GI bleed and Dr. Benson Norway did upper and lower endoscopies. The EGD showed only a small hiatal hernia. The colonoscopy showed widespread diverticulosis, a non-bleeding area of dyscrasia, and some polyps. several of these were small tubular adenomas, but he had one large polyps that measured 20 mm in diameter. It was felt this polyp was the source of the blood loss. This was removed piecemeal successfully. His DC Hgb was up to 10.8, and he has had this checked several times since going home in Dr. Deterding's office. The last count was up to 11.3 on 08-20-16. His creatinine remained stable through all this, and the last reading on 08-10-16 was 1.77. Since going home he has felt fairly well with more energy. No more dark stools have been seen.    Review of Systems  Constitutional: Negative.   Respiratory: Negative.   Cardiovascular: Negative.   Gastrointestinal: Negative.   Neurological: Negative.        Objective:   Physical Exam  Constitutional: He appears well-developed and well-nourished.  HENT:  Right Ear: External ear normal.  Left Ear: External ear normal.  Nose: Nose normal.  Mouth/Throat: Oropharynx is clear and moist.  Eyes: Conjunctivae are normal.  Neck: No thyromegaly present.  Cardiovascular: Normal rate, regular rhythm, normal heart sounds and intact distal pulses.   Pulmonary/Chest: Effort normal.  Scattered crackles   Abdominal: Soft. Bowel sounds are normal. He exhibits no distension and no mass. There is no tenderness. There is no rebound and no guarding.  Lymphadenopathy:    He has no cervical adenopathy.          Assessment & Plan:  He has  chronic anemia and will continue to get weekly Procrit shots per Dr. Jimmy Footman. In addition he recently had GI bleeding from a polyp that was removed. His Hgb is climbing. He is scheduled to follow up with Amy Easterwood in the GI office on 09-05-16, and they will determine when his next colonoscopy will be planned. His renal status is stable. Alysia Penna, MD

## 2016-08-16 ENCOUNTER — Encounter (HOSPITAL_COMMUNITY)
Admission: RE | Admit: 2016-08-16 | Discharge: 2016-08-16 | Disposition: A | Discharge status: Home / Self Care | Payer: Medicare Other | Source: Ambulatory Visit | Attending: Nephrology | Admitting: Nephrology

## 2016-08-16 DIAGNOSIS — N183 Chronic kidney disease, stage 3 (moderate): Secondary | ICD-10-CM | POA: Diagnosis not present

## 2016-08-16 DIAGNOSIS — N184 Chronic kidney disease, stage 4 (severe): Secondary | ICD-10-CM

## 2016-08-16 LAB — POCT HEMOGLOBIN-HEMACUE: Hemoglobin: 10.9 g/dL — ABNORMAL LOW (ref 13.0–17.0)

## 2016-08-16 MED ORDER — EPOETIN ALFA 10000 UNIT/ML IJ SOLN
INTRAMUSCULAR | Status: AC
  Administered 2016-08-16: 10000 [IU] via SUBCUTANEOUS
  Filled 2016-08-16: qty 1

## 2016-08-16 MED ORDER — EPOETIN ALFA 40000 UNIT/ML IJ SOLN: 30000.0000 [IU] | INTRAMUSCULAR | Status: DC

## 2016-08-16 MED ORDER — EPOETIN ALFA 20000 UNIT/ML IJ SOLN
INTRAMUSCULAR | Status: AC
  Administered 2016-08-16: 20000 [IU] via SUBCUTANEOUS
  Filled 2016-08-16: qty 1

## 2016-08-18 IMAGING — CR DG CHEST 2V
2 series · 2 of 2 positions shown · non-contrast
Comparison: October 13, 2015.

CLINICAL DATA: Shortness of breath, cough.

EXAM:
CHEST  2 VIEW

[w chest pa]
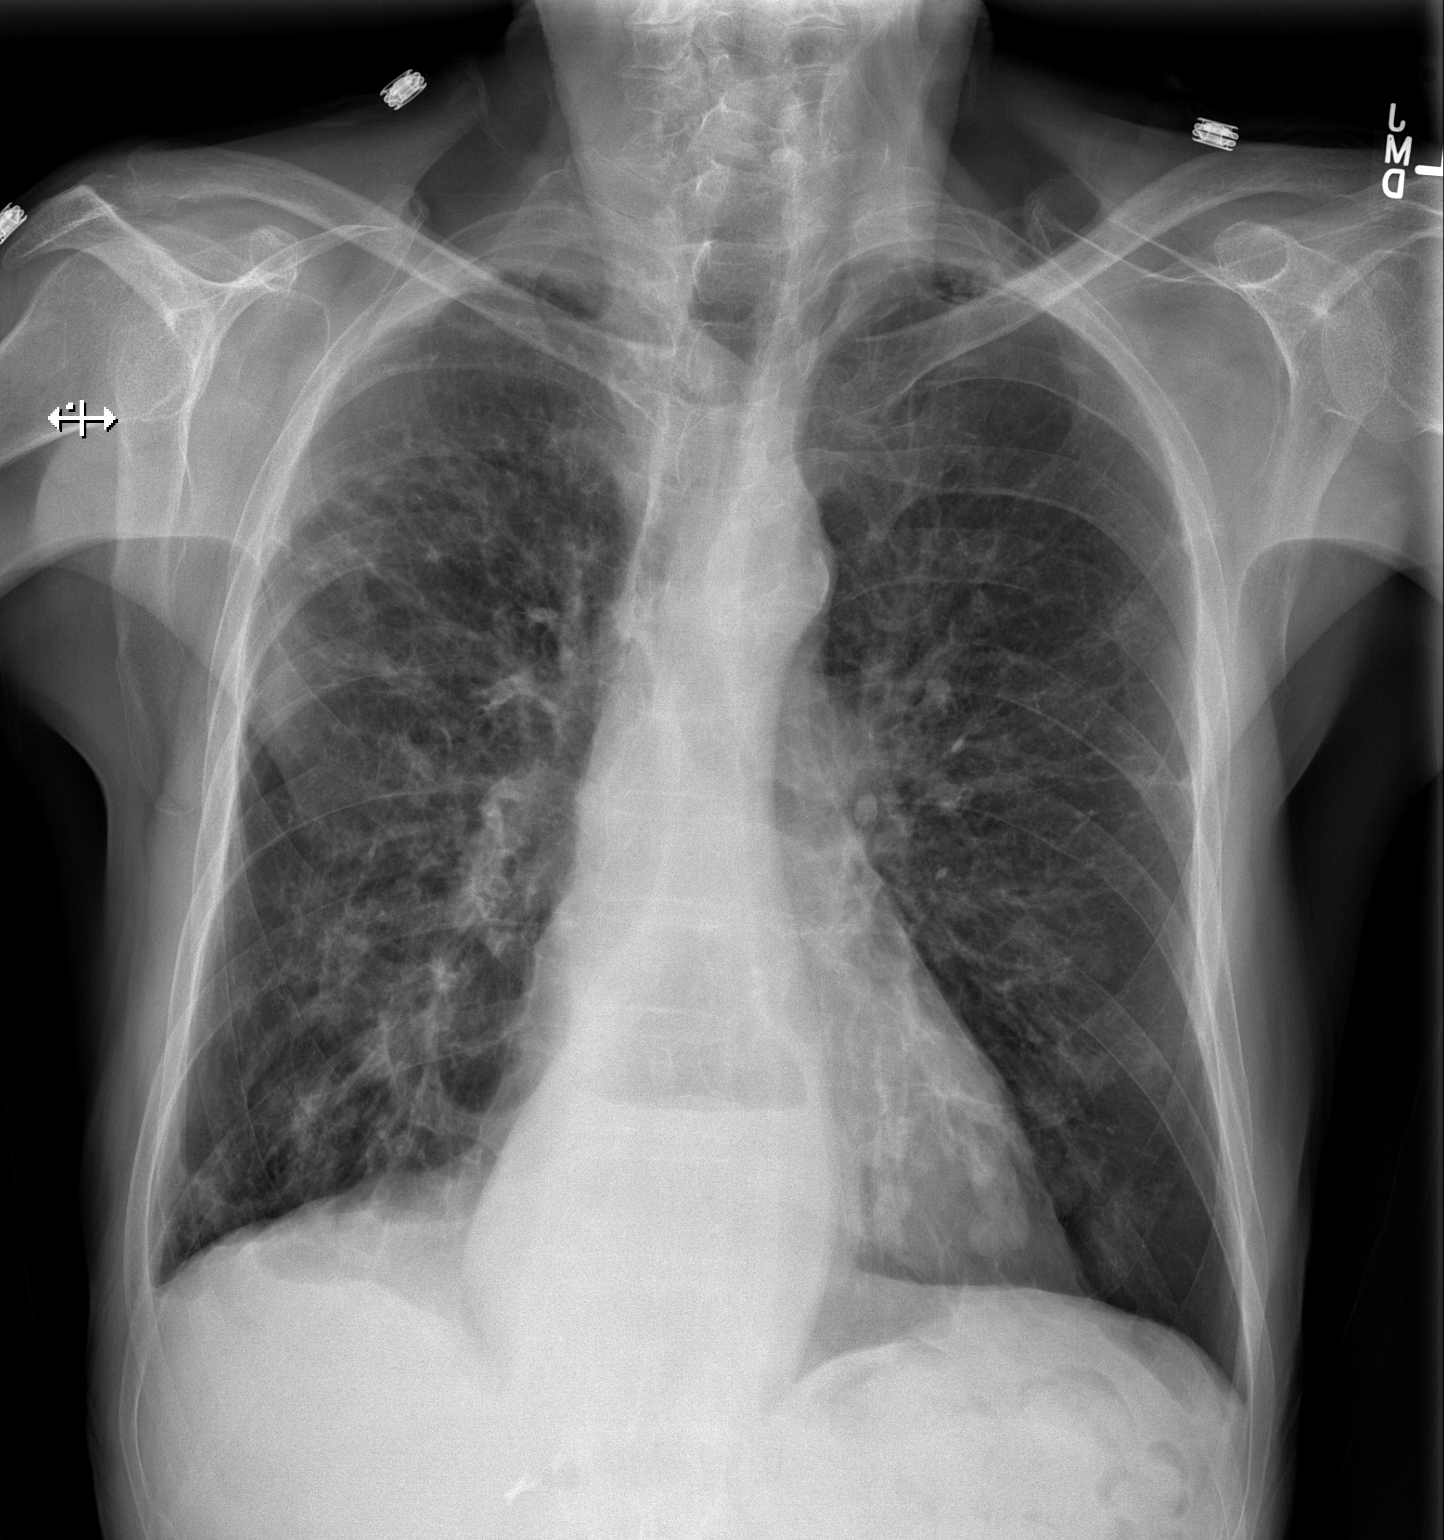

[w chest lat]
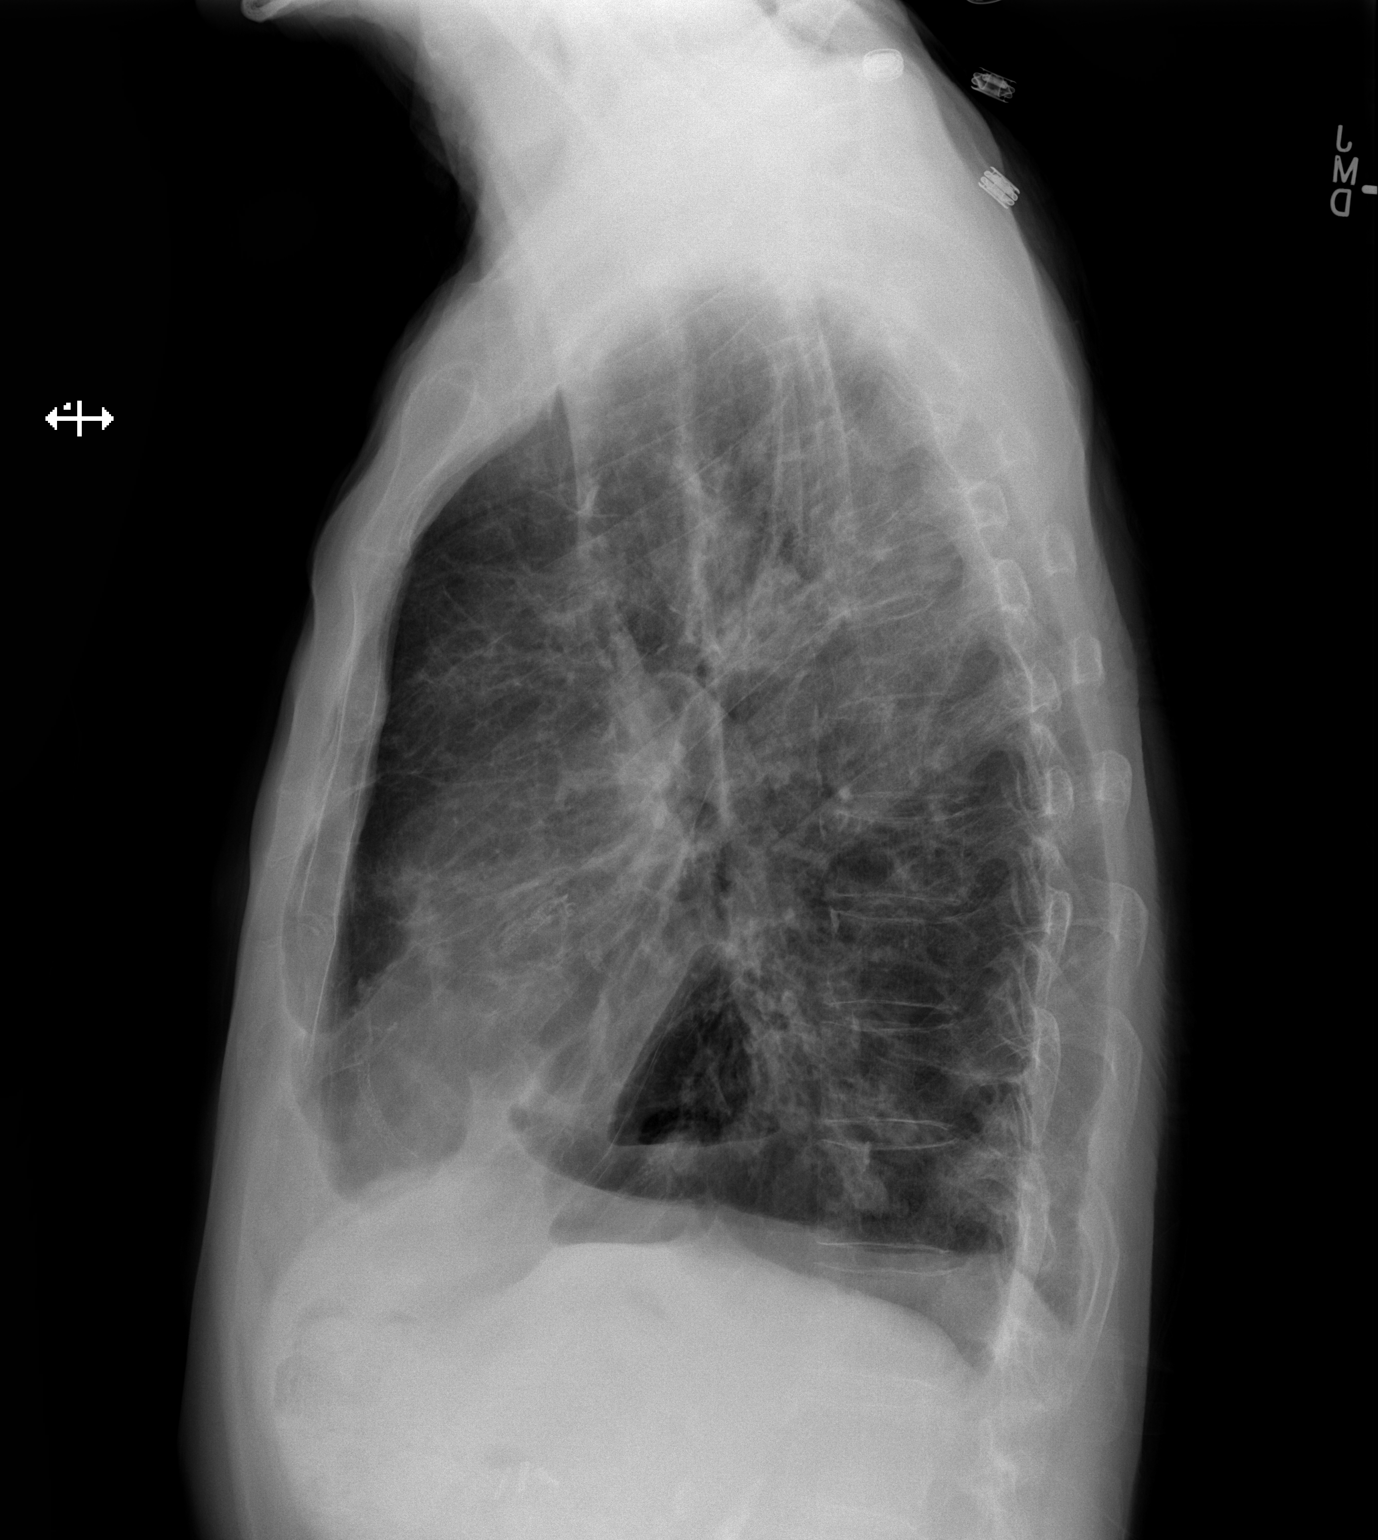

[2 of 2 positions shown; findings below may reference images not displayed]

FINDINGS: The heart size and mediastinal contours are within normal limits.
Stable moderate size hiatal hernia is noted. No pneumothorax or
pleural effusion is noted. Stable coarse fibrotic densities are
noted throughout both lungs. The visualized skeletal structures are
unremarkable.
IMPRESSION: No significant change compared to prior exam. Stable moderate size
hiatal hernia. Stable coarse fibrotic densities noted throughout
both lungs consistent with pulmonary fibrosis or chronic scarring.

## 2016-08-18 IMAGING — CT CT CHEST W/O CM
2 of 4 series · 15 of 36 positions shown, 18 images · non-contrast
Comparison: CT scan of May 28, 2015.

CLINICAL DATA: Shortness of breath, productive cough.

EXAM:
CT CHEST WITHOUT CONTRAST
TECHNIQUE: Multidetector CT imaging of the chest was performed following the
standard protocol without IV contrast.

[Series 2: chest w/o st · axial · non-contrast · 0.72mm/px · z∈[-142,+104]mm · 12 of 147 slices shown, 15 images]
[im 12/147  mediastinal]
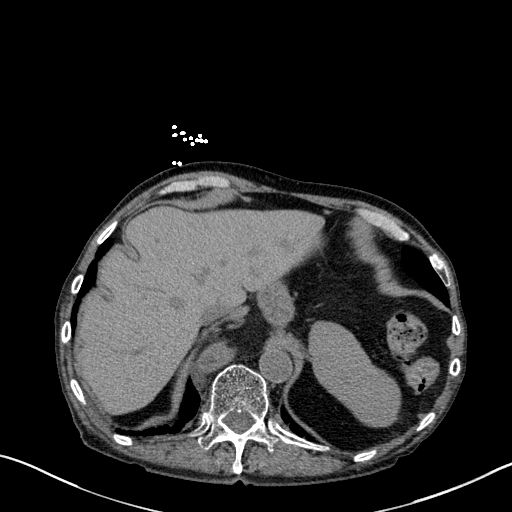
[im 12/147  lung]
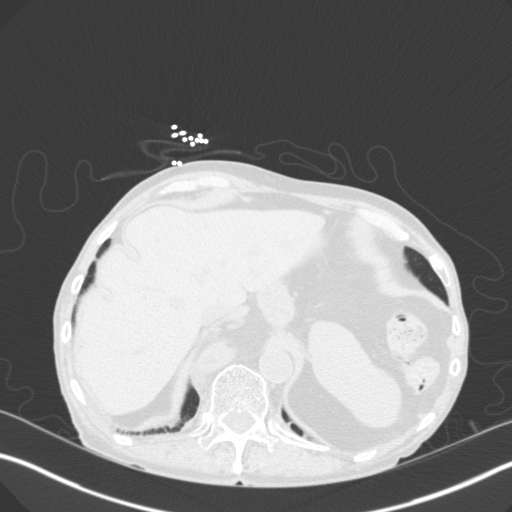
[im 23/147  lung]
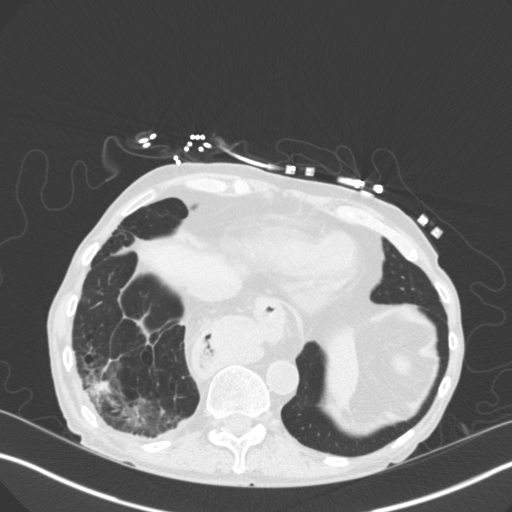
[im 34/147  lung]
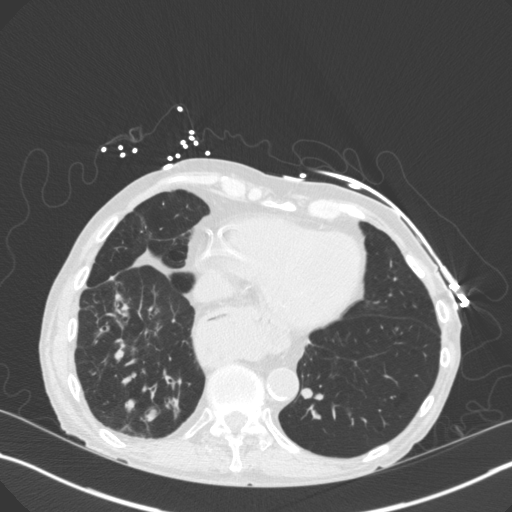
[im 45/147  lung]
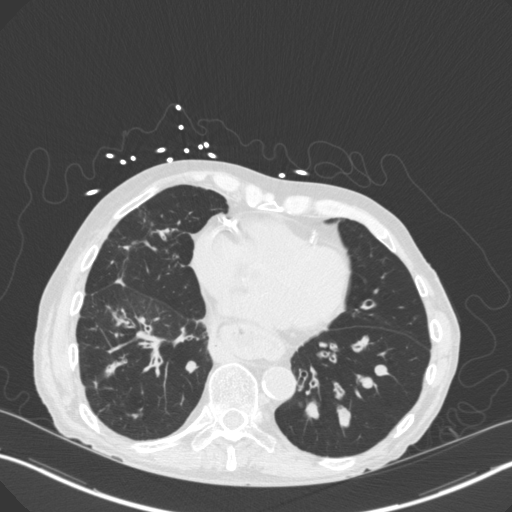
[im 57/147  mediastinal]
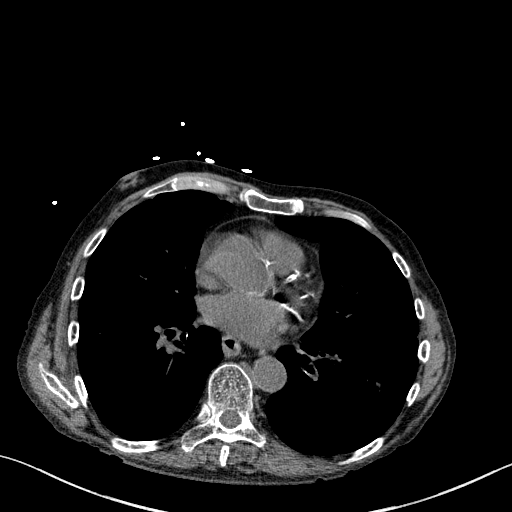
[im 57/147  lung]
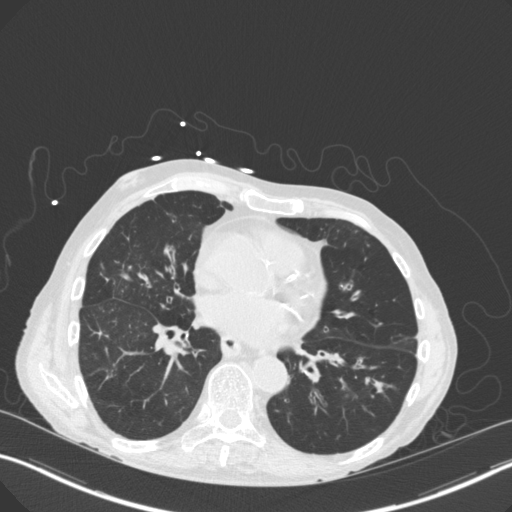
[im 68/147  lung]
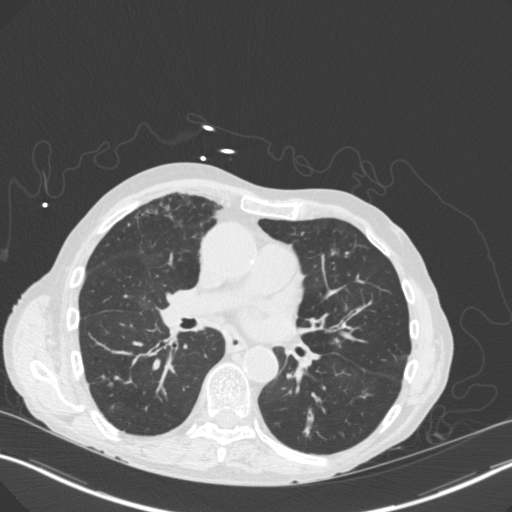
[im 79/147  lung]
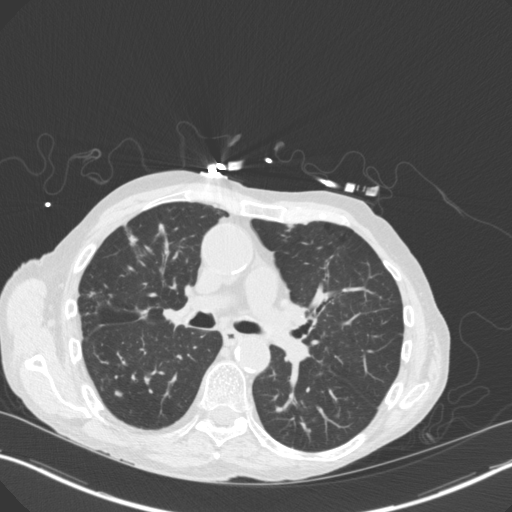
[im 90/147  lung]
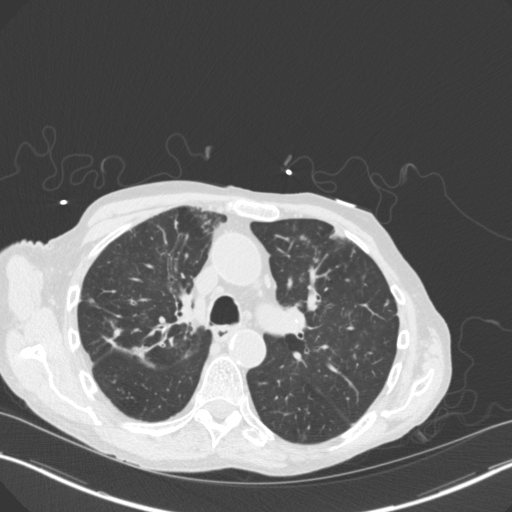
[im 102/147  mediastinal]
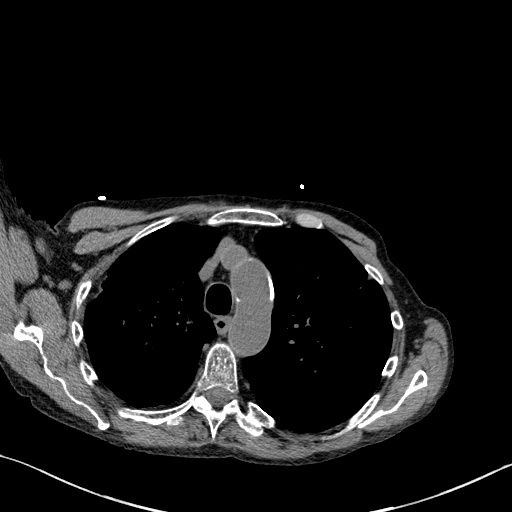
[im 102/147  lung]
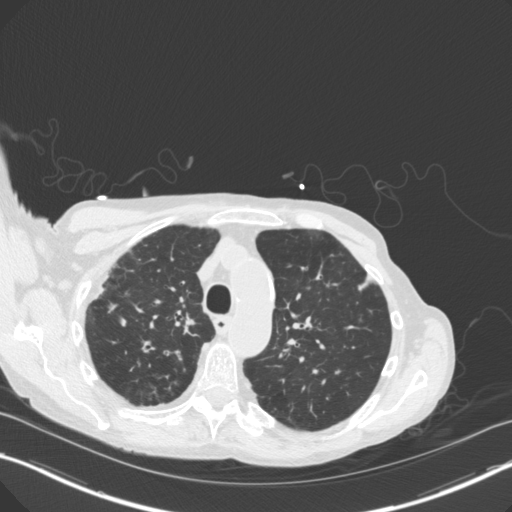
[im 113/147  lung]
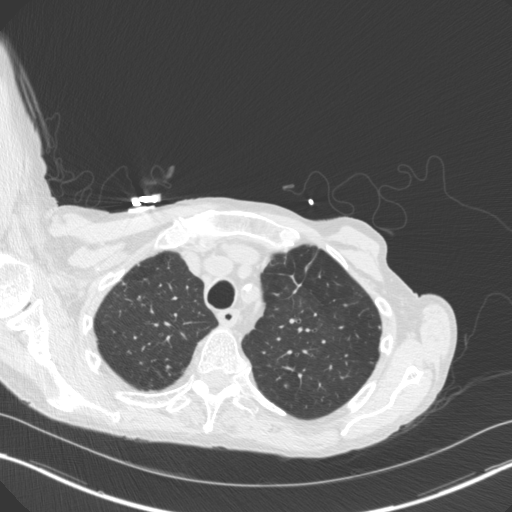
[im 124/147  lung]
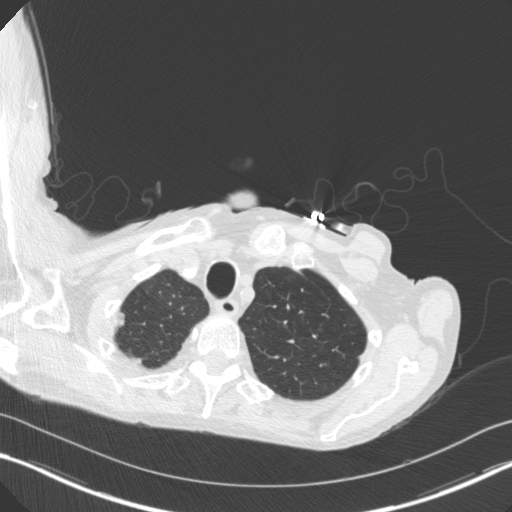
[im 135/147  lung]
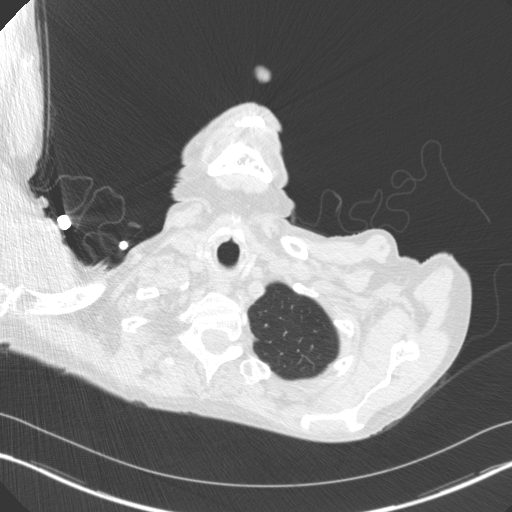

[Series 3: coronals · coronal · 0.59mm/px · 3 of 104 slices shown]
[im 21/104  lung]
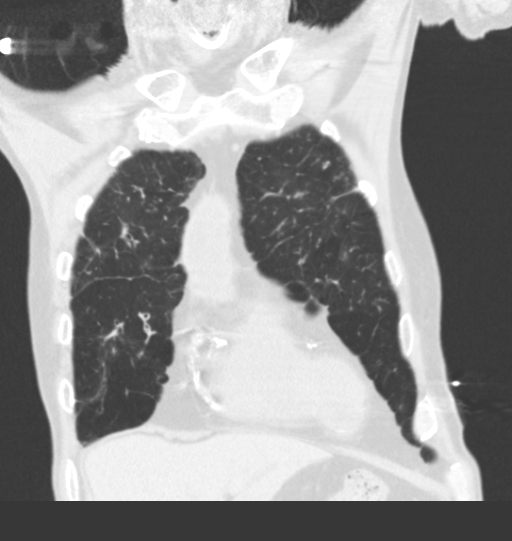
[im 42/104  lung]
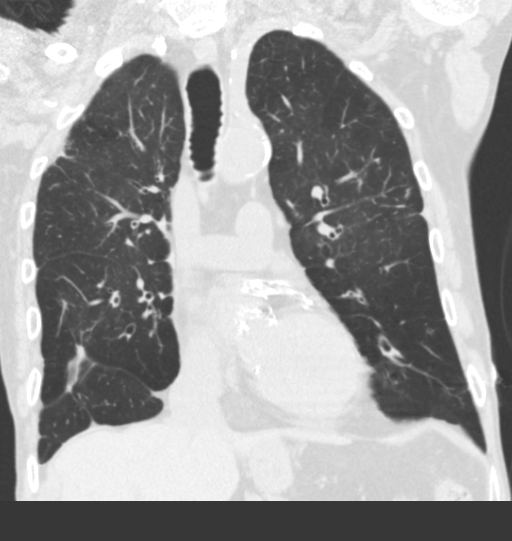
[im 62/104  lung]
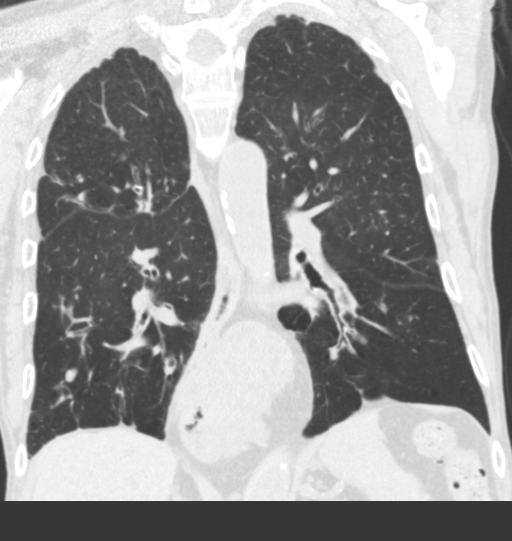

[15 of 36 positions shown; findings below may reference images not displayed]

FINDINGS: No pneumothorax or significant pleural effusion is noted.
Bronchiectasis with peribronchial thickening is noted in both lungs,
but most prominently seen in the lower lobes and lingular segment of
left upper lobe and right middle lobe. Stable biapical scarring is
noted. There remains debris within the the lower lobe bronchi
suggesting mucous plugging. Large hiatal hernia is again noted.
Irregular densities are noted posteriorly in the right lower lobe
most consistent with scarring or subsegmental atelectasis. Focal
irregular density measuring 14 x 9 mm is noted in this area which
was not present on prior exam and therefore is most consistent with
focal inflammation or atelectasis.

Atherosclerosis of thoracic aorta is noted without aneurysm
formation. Coronary artery calcifications are noted suggesting
coronary artery disease. Visualized portion of upper abdomen is
unremarkable. No mediastinal mass or adenopathy is noted.
IMPRESSION: Stable large hiatal hernia.

Coronary artery calcifications are noted suggesting coronary artery
disease.

Grossly stable bronchiectasis with peribronchial thickening is noted
throughout both lungs, which is most prominently seen in the lower
lobes bilaterally. There remains debris within the lower lobe
bronchi suggesting mucous plugging. These findings most consistent
with chronic inflammatory process or chronic atypical infection.

Irregular densities are noted posteriorly in the right lower lobe
most consistent with scarring or subsegmental atelectasis or
possibly inflammation.

## 2016-08-21 ENCOUNTER — Inpatient Hospital Stay (HOSPITAL_COMMUNITY)
Admission: EM | Admit: 2016-08-21 | Discharge: 2016-08-23 | DRG: 194 | Disposition: A | Discharge status: Home / Self Care | Payer: Medicare Other | Attending: Family Medicine | Admitting: Family Medicine

## 2016-08-21 ENCOUNTER — Encounter (HOSPITAL_COMMUNITY): Payer: Self-pay | Admitting: *Deleted

## 2016-08-21 ENCOUNTER — Emergency Department (HOSPITAL_COMMUNITY): Payer: Medicare Other

## 2016-08-21 DIAGNOSIS — Z9861 Coronary angioplasty status: Secondary | ICD-10-CM

## 2016-08-21 DIAGNOSIS — I350 Nonrheumatic aortic (valve) stenosis: Secondary | ICD-10-CM | POA: Diagnosis present

## 2016-08-21 DIAGNOSIS — I2510 Atherosclerotic heart disease of native coronary artery without angina pectoris: Secondary | ICD-10-CM | POA: Diagnosis present

## 2016-08-21 DIAGNOSIS — Z9981 Dependence on supplemental oxygen: Secondary | ICD-10-CM

## 2016-08-21 DIAGNOSIS — R05 Cough: Secondary | ICD-10-CM | POA: Diagnosis not present

## 2016-08-21 DIAGNOSIS — Z79899 Other long term (current) drug therapy: Secondary | ICD-10-CM

## 2016-08-21 DIAGNOSIS — I10 Essential (primary) hypertension: Secondary | ICD-10-CM | POA: Diagnosis present

## 2016-08-21 DIAGNOSIS — D649 Anemia, unspecified: Secondary | ICD-10-CM | POA: Diagnosis present

## 2016-08-21 DIAGNOSIS — J45909 Unspecified asthma, uncomplicated: Secondary | ICD-10-CM | POA: Diagnosis present

## 2016-08-21 DIAGNOSIS — K219 Gastro-esophageal reflux disease without esophagitis: Secondary | ICD-10-CM | POA: Diagnosis present

## 2016-08-21 DIAGNOSIS — J111 Influenza due to unidentified influenza virus with other respiratory manifestations: Secondary | ICD-10-CM

## 2016-08-21 DIAGNOSIS — Z955 Presence of coronary angioplasty implant and graft: Secondary | ICD-10-CM | POA: Diagnosis not present

## 2016-08-21 DIAGNOSIS — N400 Enlarged prostate without lower urinary tract symptoms: Secondary | ICD-10-CM | POA: Diagnosis present

## 2016-08-21 DIAGNOSIS — J101 Influenza due to other identified influenza virus with other respiratory manifestations: Secondary | ICD-10-CM | POA: Diagnosis not present

## 2016-08-21 DIAGNOSIS — Z825 Family history of asthma and other chronic lower respiratory diseases: Secondary | ICD-10-CM | POA: Diagnosis not present

## 2016-08-21 DIAGNOSIS — Z7952 Long term (current) use of systemic steroids: Secondary | ICD-10-CM

## 2016-08-21 DIAGNOSIS — Z7982 Long term (current) use of aspirin: Secondary | ICD-10-CM

## 2016-08-21 DIAGNOSIS — E440 Moderate protein-calorie malnutrition: Secondary | ICD-10-CM | POA: Diagnosis present

## 2016-08-21 DIAGNOSIS — Z7902 Long term (current) use of antithrombotics/antiplatelets: Secondary | ICD-10-CM | POA: Diagnosis not present

## 2016-08-21 DIAGNOSIS — M3131 Wegener's granulomatosis with renal involvement: Secondary | ICD-10-CM | POA: Diagnosis present

## 2016-08-21 DIAGNOSIS — Z7951 Long term (current) use of inhaled steroids: Secondary | ICD-10-CM | POA: Diagnosis not present

## 2016-08-21 DIAGNOSIS — Z86010 Personal history of colonic polyps: Secondary | ICD-10-CM | POA: Diagnosis not present

## 2016-08-21 DIAGNOSIS — Z8249 Family history of ischemic heart disease and other diseases of the circulatory system: Secondary | ICD-10-CM | POA: Diagnosis not present

## 2016-08-21 DIAGNOSIS — E785 Hyperlipidemia, unspecified: Secondary | ICD-10-CM | POA: Diagnosis present

## 2016-08-21 LAB — URINALYSIS, ROUTINE W REFLEX MICROSCOPIC
Bacteria, UA: NONE SEEN
Bilirubin Urine: NEGATIVE
Glucose, UA: NEGATIVE mg/dL
Hgb urine dipstick: NEGATIVE
Ketones, ur: NEGATIVE mg/dL
Leukocytes, UA: NEGATIVE
Nitrite: NEGATIVE
Protein, ur: 30 mg/dL — AB
Specific Gravity, Urine: 1.015 (ref 1.005–1.030)
Squamous Epithelial / LPF: NONE SEEN
pH: 8 (ref 5.0–8.0)

## 2016-08-21 LAB — BASIC METABOLIC PANEL
Anion gap: 6 (ref 5–15)
BUN: 41 mg/dL — ABNORMAL HIGH (ref 6–20)
CO2: 26 mmol/L (ref 22–32)
Calcium: 7.9 mg/dL — ABNORMAL LOW (ref 8.9–10.3)
Chloride: 109 mmol/L (ref 101–111)
Creatinine, Ser: 2.21 mg/dL — ABNORMAL HIGH (ref 0.61–1.24)
GFR calc Af Amer: 31 mL/min — ABNORMAL LOW (ref >60)
GFR calc non Af Amer: 27 mL/min — ABNORMAL LOW (ref >60)
Glucose, Bld: 97 mg/dL (ref 65–99)
Potassium: 4.5 mmol/L (ref 3.5–5.1)
Sodium: 141 mmol/L (ref 135–145)

## 2016-08-21 LAB — CBC WITH DIFFERENTIAL/PLATELET
Basophils Absolute: 0 10*3/uL (ref 0.0–0.1)
Basophils Relative: 0 %
Eosinophils Absolute: 0 10*3/uL (ref 0.0–0.7)
Eosinophils Relative: 0 %
HCT: 31.6 % — ABNORMAL LOW (ref 39.0–52.0)
Hemoglobin: 10.1 g/dL — ABNORMAL LOW (ref 13.0–17.0)
Lymphocytes Relative: 6 %
Lymphs Abs: 0.5 10*3/uL — ABNORMAL LOW (ref 0.7–4.0)
MCH: 34.6 pg — ABNORMAL HIGH (ref 26.0–34.0)
MCHC: 32 g/dL (ref 30.0–36.0)
MCV: 108.2 fL — ABNORMAL HIGH (ref 78.0–100.0)
Monocytes Absolute: 0.5 10*3/uL (ref 0.1–1.0)
Monocytes Relative: 6 %
Neutro Abs: 7.8 10*3/uL — ABNORMAL HIGH (ref 1.7–7.7)
Neutrophils Relative %: 88 %
Platelets: 151 10*3/uL (ref 150–400)
RBC: 2.92 MIL/uL — ABNORMAL LOW (ref 4.22–5.81)
RDW: 18.5 % — ABNORMAL HIGH (ref 11.5–15.5)
WBC: 8.8 10*3/uL (ref 4.0–10.5)

## 2016-08-21 LAB — MAGNESIUM: Magnesium: 1.8 mg/dL (ref 1.7–2.4)

## 2016-08-21 LAB — INFLUENZA PANEL BY PCR (TYPE A & B)
Influenza A By PCR: POSITIVE — AB
Influenza B By PCR: NEGATIVE

## 2016-08-21 LAB — PHOSPHORUS: Phosphorus: 3.1 mg/dL (ref 2.5–4.6)

## 2016-08-21 LAB — LACTIC ACID, PLASMA: LACTIC ACID, VENOUS: 0.8 mmol/L (ref 0.5–1.9)

## 2016-08-21 LAB — RAPID STREP SCREEN (MED CTR MEBANE ONLY): Streptococcus, Group A Screen (Direct): NEGATIVE

## 2016-08-21 MED ORDER — GUAIFENESIN-CODEINE 100-10 MG/5ML PO SOLN
5.0000 mL | Freq: Once | ORAL | Status: AC
  Administered 2016-08-21: 5 mL via ORAL
  Filled 2016-08-21: qty 5

## 2016-08-21 MED ORDER — LORATADINE 10 MG PO TABS
10.0000 mg | ORAL_TABLET | Freq: Every day | ORAL | Status: DC
  Administered 2016-08-22 – 2016-08-23 (×2): 10 mg via ORAL
  Filled 2016-08-21 (×2): qty 1

## 2016-08-21 MED ORDER — BISACODYL 5 MG PO TBEC
5.0000 mg | DELAYED_RELEASE_TABLET | Freq: Two times a day (BID) | ORAL | Status: DC
  Administered 2016-08-21 – 2016-08-23 (×4): 5 mg via ORAL
  Filled 2016-08-21 (×4): qty 1

## 2016-08-21 MED ORDER — PANTOPRAZOLE SODIUM 40 MG PO TBEC
40.0000 mg | DELAYED_RELEASE_TABLET | Freq: Every day | ORAL | Status: DC
  Administered 2016-08-22 – 2016-08-23 (×2): 40 mg via ORAL
  Filled 2016-08-21 (×2): qty 1

## 2016-08-21 MED ORDER — PREDNISONE 5 MG PO TABS
7.5000 mg | ORAL_TABLET | Freq: Every day | ORAL | Status: DC
  Administered 2016-08-22 – 2016-08-23 (×2): 7.5 mg via ORAL
  Filled 2016-08-21 (×2): qty 1

## 2016-08-21 MED ORDER — ACETAMINOPHEN 325 MG PO TABS
650.0000 mg | ORAL_TABLET | Freq: Once | ORAL | Status: AC
  Administered 2016-08-21: 650 mg via ORAL
  Filled 2016-08-21: qty 2

## 2016-08-21 MED ORDER — TAMSULOSIN HCL 0.4 MG PO CAPS
0.4000 mg | ORAL_CAPSULE | Freq: Every day | ORAL | Status: DC
  Administered 2016-08-22: 0.4 mg via ORAL
  Filled 2016-08-21: qty 1

## 2016-08-21 MED ORDER — SODIUM CHLORIDE 0.9 % IV BOLUS (SEPSIS)
1000.0000 mL | Freq: Once | INTRAVENOUS | Status: AC
  Administered 2016-08-21: 1000 mL via INTRAVENOUS

## 2016-08-21 MED ORDER — METOPROLOL SUCCINATE ER 25 MG PO TB24
25.0000 mg | ORAL_TABLET | Freq: Every day | ORAL | Status: DC
  Administered 2016-08-22 – 2016-08-23 (×2): 25 mg via ORAL
  Filled 2016-08-21 (×2): qty 1

## 2016-08-21 MED ORDER — ALBUTEROL SULFATE (2.5 MG/3ML) 0.083% IN NEBU: 2.5000 mg | INHALATION_SOLUTION | RESPIRATORY_TRACT | Status: DC | PRN

## 2016-08-21 MED ORDER — ASPIRIN EC 81 MG PO TBEC
81.0000 mg | DELAYED_RELEASE_TABLET | Freq: Every day | ORAL | Status: DC
  Filled 2016-08-21: qty 1

## 2016-08-21 MED ORDER — GUAIFENESIN-CODEINE 100-10 MG/5ML PO SOLN
10.0000 mL | Freq: Four times a day (QID) | ORAL | Status: DC | PRN
  Administered 2016-08-21 – 2016-08-23 (×5): 10 mL via ORAL
  Filled 2016-08-21 (×5): qty 10

## 2016-08-21 MED ORDER — SODIUM BICARBONATE 650 MG PO TABS
1300.0000 mg | ORAL_TABLET | Freq: Two times a day (BID) | ORAL | Status: DC
  Administered 2016-08-21 – 2016-08-23 (×4): 1300 mg via ORAL
  Filled 2016-08-21 (×4): qty 2

## 2016-08-21 MED ORDER — MOMETASONE FURO-FORMOTEROL FUM 200-5 MCG/ACT IN AERO
2.0000 | INHALATION_SPRAY | Freq: Two times a day (BID) | RESPIRATORY_TRACT | Status: DC
  Administered 2016-08-22 – 2016-08-23 (×3): 2 via RESPIRATORY_TRACT
  Filled 2016-08-21: qty 8.8

## 2016-08-21 MED ORDER — FINASTERIDE 5 MG PO TABS
5.0000 mg | ORAL_TABLET | Freq: Every day | ORAL | Status: DC
  Administered 2016-08-22 – 2016-08-23 (×2): 5 mg via ORAL
  Filled 2016-08-21 (×2): qty 1

## 2016-08-21 MED ORDER — IPRATROPIUM-ALBUTEROL 0.5-2.5 (3) MG/3ML IN SOLN
3.0000 mL | Freq: Four times a day (QID) | RESPIRATORY_TRACT | Status: DC
  Administered 2016-08-22: 3 mL via RESPIRATORY_TRACT
  Filled 2016-08-21: qty 3

## 2016-08-21 MED ORDER — ENOXAPARIN SODIUM 30 MG/0.3ML ~~LOC~~ SOLN
30.0000 mg | Freq: Every day | SUBCUTANEOUS | Status: DC
  Administered 2016-08-21: 30 mg via SUBCUTANEOUS
  Filled 2016-08-21: qty 0.3

## 2016-08-21 MED ORDER — CALCITRIOL 0.25 MCG PO CAPS
0.2500 ug | ORAL_CAPSULE | Freq: Every day | ORAL | Status: DC
  Administered 2016-08-22 – 2016-08-23 (×2): 0.25 ug via ORAL
  Filled 2016-08-21 (×2): qty 1

## 2016-08-21 MED ORDER — NITROGLYCERIN 0.4 MG SL SUBL: 0.4000 mg | SUBLINGUAL_TABLET | SUBLINGUAL | Status: DC | PRN

## 2016-08-21 MED ORDER — DOCUSATE SODIUM 100 MG PO CAPS
100.0000 mg | ORAL_CAPSULE | Freq: Two times a day (BID) | ORAL | Status: DC
  Administered 2016-08-21 – 2016-08-23 (×4): 100 mg via ORAL
  Filled 2016-08-21 (×4): qty 1

## 2016-08-21 MED ORDER — SIMVASTATIN 20 MG PO TABS: 20.0000 mg | ORAL_TABLET | Freq: Every day | ORAL | Status: DC

## 2016-08-21 MED ORDER — SIMETHICONE 80 MG PO CHEW: 80.0000 mg | CHEWABLE_TABLET | Freq: Four times a day (QID) | ORAL | Status: DC | PRN

## 2016-08-21 MED ORDER — ACETAMINOPHEN 325 MG PO TABS: 650.0000 mg | ORAL_TABLET | Freq: Four times a day (QID) | ORAL | Status: DC | PRN

## 2016-08-21 MED ORDER — OSELTAMIVIR PHOSPHATE 75 MG PO CAPS
75.0000 mg | ORAL_CAPSULE | Freq: Two times a day (BID) | ORAL | Status: DC
  Administered 2016-08-21: 75 mg via ORAL
  Filled 2016-08-21 (×2): qty 1

## 2016-08-21 MED ORDER — CLOPIDOGREL BISULFATE 75 MG PO TABS
75.0000 mg | ORAL_TABLET | Freq: Every day | ORAL | Status: DC
  Administered 2016-08-22 – 2016-08-23 (×2): 75 mg via ORAL
  Filled 2016-08-21 (×2): qty 1

## 2016-08-21 NOTE — ED Triage Notes (Signed)
Pt here with URI x5 days with cough and cold and fever.  Pt had a temp of 101.5 oral today.  Pt took tylenol at 13:30.  Pt lives at home with his wife.

## 2016-08-21 NOTE — H&P (Signed)
History and Physical    Richard [name] [id number] DOB: [birth year] DOA: 08/21/2016  PCP: Alysia Penna, MD   Patient coming from: Home.  Chief Complaint: Cough and fever.  HPI: Richard [name] is a 81 y.o. male with medical history significant of colon polyps, anemia, asthma, BPH, bronchiectasis, Wegener granulomatosis, CAD, cataracts, diverticulosis, femoral pseudoaneurysm, GERD, esophageal peptic stricture, hypertension who is coming to the emergency department with complaints of productive progressively worse fatigue, decreased appetite, productive cough, sore throat, myalgias and arthralgias since Thursday evening and fever of 101.83F since earlier today at home. He denies sick contacts or travel history, but was out of the [employer] on Wednesday. He is up-to-date with his influenza and pneumonia vaccines.  ED Course: The patient received supplemental oxygen, normal saline boluses and bronchodilators reporting relief. His workup showed WBC of 8.8, hemoglobin 10.1 g/dL, platelets of 151. His chemistries showed a BUN of 41, creatinine 2.21 calcium of 7.9 mg/dL. Chest radiographs show a stable interstitial fibrosis without acute abnormality. An influenza by PCR swab was positive for influenza A.  Review of Systems: As per HPI otherwise 10 point review of systems negative.    Past Medical History:  Diagnosis Date  . Adenomatous colon polyp   . Anemia   . Asthma   . BPH (benign prostatic hyperplasia)    sees Dr. Risa Grill, biopsy June 2015 was benign   . Bronchiectasis ([place])   . CAD (coronary artery disease)    a. [place] s/p [employer] x3 and ultimately BMS to pLAD& POBA to CTO of circumflex/marginal vessel on 03/17/14 and staged BMS to [facility] on 03/21/14  . Cataract   . Diverticulosis   . Femoral artery pseudo-aneurysm, right ([place])    a. s/p repair. Follow up with Dr. Kellie Simmering   . Fungal infection    lungs  . GERD (gastroesophageal reflux disease)   . Hiatal hernia   . HOH (hard of hearing)     bilaterally  . HTN (hypertension) 09/28/2013  . On home oxygen therapy    uses 2 l/m nasally at bedtime  . Peptic stricture of esophagus   . Wegener's granulomatosis ([place])    sees Dr. [name]     Past Surgical History:  Procedure Laterality Date  . CARDIAC CATHETERIZATION  03/17/2014   Procedure: CORONARY BALLOON ANGIOPLASTY;  Surgeon: Troy Sine, MD;  Location: [facility] CATH LAB;  Service: Cardiovascular;;  . CATARACT EXTRACTION, BILATERAL  12-18-12   bilateral  . CHOLECYSTECTOMY N/A 12/21/2012   Procedure: LAPAROSCOPIC CHOLECYSTECTOMY WITH INTRAOPERATIVE CHOLANGIOGRAM;  Surgeon: Edward Jolly, MD;  Location: [facility] ORS;  Service: General;  Laterality: N/A;  . COLONOSCOPY  08-16-11   per Dr. Fuller Plan, diverticulosis and polyps, repeat in 5 yrs   . COLONOSCOPY N/A 08/01/2016   Procedure: COLONOSCOPY;  Surgeon: Carol Ada, MD;  Location: [facility] ENDOSCOPY;  Service: Endoscopy;  Laterality: N/A;  . CORONARY ANGIOPLASTY  03/2014  . ESOPHAGOGASTRODUODENOSCOPY N/A 07/31/2016   Procedure: ESOPHAGOGASTRODUODENOSCOPY (EGD);  Surgeon: Carol Ada, MD;  Location: [facility];  Service: Endoscopy;  Laterality: N/A;  . ESOPHAGOGASTRODUODENOSCOPY (EGD) WITH ESOPHAGEAL DILATION  11-29-10   per Dr. Fuller Plan   . EYE SURGERY     cataracts removed.   Marland Kitchen HEMATOMA EVACUATION Right 03/19/2014   Procedure: Suture repair of femoral artery with evacuation of hematoma;  Surgeon: Mal Misty, MD;  Location: [place];  Service: Vascular;  Laterality: Right;  . HERNIA REPAIR    . LEFT HEART CATHETERIZATION WITH CORONARY ANGIOGRAM N/A 03/14/2014  Procedure: LEFT HEART CATHETERIZATION WITH CORONARY ANGIOGRAM;  Surgeon: Leonie Man, MD;  Location: [facility] CATH LAB;  Service: Cardiovascular;  Laterality: N/A;  . PERCUTANEOUS CORONARY STENT INTERVENTION (PCI-S) N/A 03/17/2014   Procedure: PERCUTANEOUS CORONARY STENT INTERVENTION (PCI-S);  Surgeon: Troy Sine, MD;  Location: [facility] CATH LAB;  Service: Cardiovascular;  Laterality: N/A;    . PERCUTANEOUS CORONARY STENT INTERVENTION (PCI-S) N/A 03/21/2014   Procedure: PERCUTANEOUS CORONARY STENT INTERVENTION (PCI-S);  Surgeon: Burnell Blanks, MD;  Location: [facility] CATH LAB;  Service: Cardiovascular;  Laterality: N/A;  . RENAL BIOPSY       reports that he has never smoked. He has never used smokeless tobacco. He reports that he does not drink alcohol or use drugs.  No Known Allergies  Family History  Problem Relation Age of Onset  . Asthma Father   . Coronary artery disease Mother   . Coronary artery disease Brother   . Coronary artery disease Brother   . Colon cancer Neg Hx   . Stomach cancer Neg Hx      Prior to Admission medications   Medication Sig Start Date End Date Taking? Authorizing Provider  Acetaminophen (TYLENOL) 325 MG CAPS Take 325 mg by mouth every 6 (six) hours as needed (pain). TAKE PER BOX AS NEEDED FOR PAIN    Yes Historical Provider, MD  albuterol (VENTOLIN HFA) 108 (90 Base) MCG/ACT inhaler Inhale 1-2 puffs into the lungs every 4 (four) hours as needed for wheezing or shortness of breath. 07/12/16  Yes Tanda Rockers, MD  aspirin EC 81 MG tablet Take 1 tablet (81 mg total) by mouth daily. 08/08/16  Yes Reyne Dumas, MD  b complex vitamins tablet Take 1 tablet by mouth daily.     Yes Historical Provider, MD  bisacodyl (DULCOLAX) 5 MG EC tablet Take 5 mg by mouth 2 (two) times daily.    Yes Historical Provider, MD  calcitRIOL (ROCALTROL) 0.25 MCG capsule Take 0.25 mcg by mouth daily.  09/06/15  Yes Historical Provider, MD  clopidogrel (PLAVIX) 75 MG tablet Take 1 tablet (75 mg total) by mouth daily. TAKE ONE TABLET BY MOUTH ONCE DAILY 08/05/16  Yes Reyne Dumas, MD  cyclophosphamide (CYTOXAN) 25 MG capsule Take 25 mg by mouth daily. Take one capsule by mouth daily on an empty stomach 1 hour before or 2 hours after meal. 07/26/16  Yes Historical Provider, MD  dextromethorphan-guaiFENesin (MUCINEX DM) 30-600 MG 12hr tablet Take 1 tablet by mouth 2 (two)  times daily.   Yes Historical Provider, MD  docusate sodium (COLACE) 100 MG capsule Take 100 mg by mouth 2 (two) times daily.   Yes Historical Provider, MD  finasteride (PROSCAR) 5 MG tablet TAKE ONE TABLET BY MOUTH ONCE DAILY Patient taking differently: TAKE 5mg  BY MOUTH ONCE DAILY 05/25/16  Yes [name], MD  ipratropium-albuterol (DUONEB) 0.5-2.5 (3) MG/3ML SOLN Take 3 mLs by nebulization every 4 (four) hours as needed (if inhaler is not effective in resolving shortness of breath or wheezing). Patient taking differently: Take 3 mLs by nebulization every 4 (four) hours as needed (WITH FLUTTER VALVE FOR WHEEZING, SHORTNESS OF BREATH-PLAN C).  02/03/16  Yes Tanda Rockers, MD  loratadine (CLARITIN) 10 MG tablet Take 10 mg by mouth daily.    Yes Historical Provider, MD  metoprolol succinate (TOPROL-XL) 25 MG 24 hr tablet Take 1 tablet (25 mg total) by mouth daily. 07/18/16  Yes Pixie Casino, MD  nitroGLYCERIN (NITROSTAT) 0.4 MG SL tablet Place 1  tablet (0.4 mg total) under the tongue every 5 (five) minutes x 3 doses as needed for chest pain. 03/24/15  Yes Pixie Casino, MD  pantoprazole (PROTONIX) 40 MG tablet Take 1 tablet (40 mg total) by mouth daily. 08/02/16  Yes Reyne Dumas, MD  predniSONE (DELTASONE) 5 MG tablet Take 7.5 mg by mouth daily with breakfast.   Yes Historical Provider, MD  simethicone (MYLICON) 0000000 MG chewable tablet Chew 125 mg by mouth every 6 (six) hours as needed for flatulence. TAKE PER BOX FOR GAS    Yes Historical Provider, MD  simvastatin (ZOCOR) 20 MG tablet TAKE ONE TABLET BY MOUTH ONCE DAILY AT  6  PM 07/07/16  Yes Pixie Casino, MD  sodium bicarbonate 650 MG tablet Take 2 tablets (1,300 mg total) by mouth 2 (two) times daily. NEED OV. 08/12/15  Yes Pixie Casino, MD  SYMBICORT 160-4.5 MCG/ACT inhaler INHALE TWO PUFFS BY MOUTH TWICE DAILY 06/01/16  Yes Tanda Rockers, MD  tamsulosin (FLOMAX) 0.4 MG CAPS capsule TAKE ONE CAPSULE BY MOUTH ONCE DAILY IN THE EVENING  07/05/16  Yes [name], MD  ciprofloxacin (CIPRO) 750 MG tablet Take 1 tablet (750 mg total) by mouth 2 (two) times daily. Patient not taking: Reported on 08/21/2016 08/04/16   Tanda Rockers, MD    Physical Exam:  Constitutional: Looks fatigued. Vitals:   08/21/16 2110 08/21/16 2200 08/21/16 2201 08/21/16 2230  BP:  (!) 102/49 (!) 90/50 99/62  Pulse: 104 102  100  Resp: 25 (!) 27  (!) 29  Temp:      TempSrc:      SpO2: 98% 98%  99%  Weight:      Height:       Eyes: PERRL, lids and conjunctivae are injected, ENMT: Mucous membranes are mildly dry. Posterior pharynx shows erythema.  Neck: normal, supple, no masses, no thyromegaly Respiratory: Tachypneic in the mid 20s, bilateral rhonchi, No accessory muscle use.  Cardiovascular: Regular rate and rhythm, positive systolic ejection murmur 3/6, no rubs / gallops. No extremity edema. 2+ pedal pulses. No carotid bruits.  Abdomen: Bowel sounds positive.Soft, no tenderness, no masses palpated. No hepatosplenomegaly.   Musculoskeletal: no clubbing / cyanosis. Good ROM, no contractures. Normal muscle tone.  Skin: no rashes, lesions, ulcers on limited skin exam. Neurologic: CN 2-12 grossly intact. Sensation intact, DTR normal. Strength 5/5 in all 4.  Psychiatric: Normal judgment and insight. Alert and oriented x 4. Normal mood.    Labs on Admission: I have personally reviewed following labs and imaging studies  CBC:  Recent Labs Lab 08/16/16 1221 08/21/16 1806  WBC  --  8.8  NEUTROABS  --  7.8*  HGB 10.9* 10.1*  HCT  --  31.6*  MCV  --  108.2*  PLT  --  123XX123   Basic Metabolic Panel:  Recent Labs Lab 08/21/16 1806  NA 141  K 4.5  CL 109  CO2 26  GLUCOSE 97  BUN 41*  CREATININE 2.21*  CALCIUM 7.9*   GFR: Estimated Creatinine Clearance: 21.5 mL/min (by C-G formula based on SCr of 2.21 mg/dL (H)). Liver Function Tests: No results for input(s): AST, ALT, ALKPHOS, BILITOT, PROT, ALBUMIN in the last 168 hours. No  results for input(s): LIPASE, AMYLASE in the last 168 hours. No results for input(s): AMMONIA in the last 168 hours. Coagulation Profile: No results for input(s): INR, PROTIME in the last 168 hours. Cardiac Enzymes: No results for input(s): CKTOTAL, CKMB, CKMBINDEX, TROPONINI in  the last 168 hours. BNP (last 3 results) No results for input(s): PROBNP in the last 8760 hours. HbA1C: No results for input(s): HGBA1C in the last 72 hours. CBG: No results for input(s): GLUCAP in the last 168 hours. Lipid Profile: No results for input(s): CHOL, HDL, LDLCALC, TRIG, CHOLHDL, LDLDIRECT in the last 72 hours. Thyroid Function Tests: No results for input(s): TSH, T4TOTAL, FREET4, T3FREE, THYROIDAB in the last 72 hours. Anemia Panel: No results for input(s): VITAMINB12, FOLATE, FERRITIN, TIBC, IRON, RETICCTPCT in the last 72 hours. Urine analysis:    Component Value Date/Time   COLORURINE YELLOW 08/21/2016 1806   APPEARANCEUR CLEAR 08/21/2016 1806   LABSPEC 1.015 08/21/2016 1806   PHURINE 8.0 08/21/2016 1806   GLUCOSEU NEGATIVE 08/21/2016 1806   GLUCOSEU NEGATIVE 07/26/2013 1142   HGBUR NEGATIVE 08/21/2016 1806   HGBUR trace-intact 11/13/2008 0000   BILIRUBINUR NEGATIVE 08/21/2016 1806   BILIRUBINUR neg 05/16/2012 1300   KETONESUR NEGATIVE 08/21/2016 1806   PROTEINUR 30 (A) 08/21/2016 1806   UROBILINOGEN 0.2 03/25/2014 1637   NITRITE NEGATIVE 08/21/2016 1806   LEUKOCYTESUR NEGATIVE 08/21/2016 1806    Recent Results (from the past 240 hour(s))  Rapid strep screen     Status: None   Collection Time: 08/21/16  6:06 PM  Result Value Ref Range Status   Streptococcus, Group A Screen (Direct) NEGATIVE NEGATIVE Final    Comment: (NOTE) A Rapid Antigen test may result negative if the antigen level in the sample is below the detection level of this test. The FDA has not cleared this test as a stand-alone test therefore the rapid antigen negative result has reflexed to a Group A Strep  culture.      Radiological Exams on Admission: Dg Chest 2 View  Result Date: 08/21/2016 CLINICAL DATA:  Productive cough. Shortness of breath. Weakness. Fever for the past 2 days. EXAM: CHEST  2 VIEW COMPARISON:  Chest radiographs and CT dated 07/30/2016. FINDINGS: Normal sized heart. Aortic arch calcifications. Moderate-sized hiatal hernia. Stable coarse interstitial prominence throughout both lungs. Cholecystectomy clips. Diffuse osteopenia. IMPRESSION: 1. No acute abnormality. 2. Stable interstitial fibrosis. 3. Stable moderate-sized hiatal hernia. 4. Aortic atherosclerosis. Electronically Signed   By: Claudie Revering M.D.   On: 08/21/2016 15:39     Assessment/Plan Principal Problem:   Influenza A Admit to inpatient/telemetry. Droplet and contact precautions. Advised the patient to wear mask, particularly since family members are planning to visit. Discontinue Cytoxan at this time. Continue supplemental oxygen. Continue bronchodilators and mucolytic's. Start Tamiflu 75 mg by mouth twice a day  Active Problems:   Hyperlipidemia Hold statin for now. Monitor LFTs.    GERD Pantoprazole 40 mg by mouth daily.    BPH (benign prostatic hyperplasia) Continue Flomax and finasteride.    Hypertension Metoprolol 25 mg by mouth daily. Monitor blood pressure.    CAD- severe 4 V CAD- not CABG candidate Continue aspirin 81 mg by mouth daily. Continue clopidogrel 75 mg by mouth daily. Continue metoprolol succinate 25 mg by mouth daily. Sublingual nitroglycerin as needed for angina.    Anemia Monitor hematocrit and hemoglobin.    Wegener's granulomatosis with renal involvement ([place]) Hold Cytoxan while the patient has the flu. Continue prednisone 7.5 mg by mouth daily. Continue bronchodilators and mucolytic's.    Aortic valve stenosis Per patient, he is scheduled for valve replacement in March.    DVT prophylaxis: Lovenox SQ. Code Status: Full code. Family Communication: His  wife was present in the room. Disposition Plan: Admit to isolation  for IV hydration, mucolytics, bronchodilators and treatment with Tamiflu. Consults called:  Admission status: Inpatient/telemetry.   Reubin Milan MD [facility] Pager [phone].  If 7PM-7AM, please contact night-coverage [email or web address] Password [facility]  08/21/2016, 10:44 PM

## 2016-08-21 NOTE — ED Notes (Signed)
Patient transported to X-ray 

## 2016-08-22 DIAGNOSIS — I2510 Atherosclerotic heart disease of native coronary artery without angina pectoris: Secondary | ICD-10-CM

## 2016-08-22 DIAGNOSIS — I10 Essential (primary) hypertension: Secondary | ICD-10-CM

## 2016-08-22 DIAGNOSIS — I350 Nonrheumatic aortic (valve) stenosis: Secondary | ICD-10-CM

## 2016-08-22 DIAGNOSIS — M3131 Wegener's granulomatosis with renal involvement: Secondary | ICD-10-CM

## 2016-08-22 DIAGNOSIS — E440 Moderate protein-calorie malnutrition: Secondary | ICD-10-CM | POA: Insufficient documentation

## 2016-08-22 DIAGNOSIS — D649 Anemia, unspecified: Secondary | ICD-10-CM

## 2016-08-22 DIAGNOSIS — K219 Gastro-esophageal reflux disease without esophagitis: Secondary | ICD-10-CM

## 2016-08-22 DIAGNOSIS — J101 Influenza due to other identified influenza virus with other respiratory manifestations: Principal | ICD-10-CM

## 2016-08-22 LAB — COMPREHENSIVE METABOLIC PANEL WITH GFR
ALT: 13 U/L — ABNORMAL LOW (ref 17–63)
AST: 20 U/L (ref 15–41)
Albumin: 2.7 g/dL — ABNORMAL LOW (ref 3.5–5.0)
Alkaline Phosphatase: 54 U/L (ref 38–126)
Anion gap: 7 (ref 5–15)
BUN: 34 mg/dL — ABNORMAL HIGH (ref 6–20)
CO2: 23 mmol/L (ref 22–32)
Calcium: 7.2 mg/dL — ABNORMAL LOW (ref 8.9–10.3)
Chloride: 113 mmol/L — ABNORMAL HIGH (ref 101–111)
Creatinine, Ser: 1.85 mg/dL — ABNORMAL HIGH (ref 0.61–1.24)
GFR calc Af Amer: 39 mL/min — ABNORMAL LOW
GFR calc non Af Amer: 33 mL/min — ABNORMAL LOW
Glucose, Bld: 93 mg/dL (ref 65–99)
Potassium: 4.4 mmol/L (ref 3.5–5.1)
Sodium: 143 mmol/L (ref 135–145)
Total Bilirubin: 0.8 mg/dL (ref 0.3–1.2)
Total Protein: 4.7 g/dL — ABNORMAL LOW (ref 6.5–8.1)

## 2016-08-22 LAB — CBC WITH DIFFERENTIAL/PLATELET
Basophils Absolute: 0 K/uL (ref 0.0–0.1)
Basophils Relative: 0 %
Eosinophils Absolute: 0 K/uL (ref 0.0–0.7)
Eosinophils Relative: 0 %
HCT: 29.4 % — ABNORMAL LOW (ref 39.0–52.0)
Hemoglobin: 9.2 g/dL — ABNORMAL LOW (ref 13.0–17.0)
Lymphocytes Relative: 5 %
Lymphs Abs: 0.2 K/uL — ABNORMAL LOW (ref 0.7–4.0)
MCH: 34.3 pg — ABNORMAL HIGH (ref 26.0–34.0)
MCHC: 31.3 g/dL (ref 30.0–36.0)
MCV: 109.7 fL — ABNORMAL HIGH (ref 78.0–100.0)
Monocytes Absolute: 0.2 K/uL (ref 0.1–1.0)
Monocytes Relative: 5 %
Neutro Abs: 4.5 K/uL (ref 1.7–7.7)
Neutrophils Relative %: 90 %
Platelets: 125 K/uL — ABNORMAL LOW (ref 150–400)
RBC: 2.68 MIL/uL — ABNORMAL LOW (ref 4.22–5.81)
RDW: 18.5 % — ABNORMAL HIGH (ref 11.5–15.5)
WBC: 4.9 K/uL (ref 4.0–10.5)

## 2016-08-22 LAB — CBC
HCT: 26.8 % — ABNORMAL LOW (ref 39.0–52.0)
Hemoglobin: 8.5 g/dL — ABNORMAL LOW (ref 13.0–17.0)
MCH: 33.9 pg (ref 26.0–34.0)
MCHC: 31.7 g/dL (ref 30.0–36.0)
MCV: 106.8 fL — AB (ref 78.0–100.0)
PLATELETS: 123 10*3/uL — AB (ref 150–400)
RBC: 2.51 MIL/uL — AB (ref 4.22–5.81)
RDW: 17.8 % — AB (ref 11.5–15.5)
WBC: 3.5 10*3/uL — ABNORMAL LOW (ref 4.0–10.5)

## 2016-08-22 LAB — PROTIME-INR
INR: 1.71
Prothrombin Time: 20.3 seconds — ABNORMAL HIGH (ref 11.4–15.2)

## 2016-08-22 LAB — LACTIC ACID, PLASMA: Lactic Acid, Venous: 0.8 mmol/L (ref 0.5–1.9)

## 2016-08-22 LAB — HEMOGLOBIN AND HEMATOCRIT, BLOOD
HCT: 29.1 % — ABNORMAL LOW (ref 39.0–52.0)
HEMOGLOBIN: 9 g/dL — AB (ref 13.0–17.0)

## 2016-08-22 MED ORDER — MAGNESIUM SULFATE 2 GM/50ML IV SOLN
2.0000 g | Freq: Once | INTRAVENOUS | Status: AC
  Administered 2016-08-22: 2 g via INTRAVENOUS
  Filled 2016-08-22: qty 50

## 2016-08-22 MED ORDER — OSELTAMIVIR PHOSPHATE 30 MG PO CAPS
30.0000 mg | ORAL_CAPSULE | Freq: Two times a day (BID) | ORAL | Status: DC
  Filled 2016-08-22: qty 1

## 2016-08-22 MED ORDER — SODIUM CHLORIDE 0.9 % IV SOLN
INTRAVENOUS | Status: DC
  Administered 2016-08-22 – 2016-08-23 (×3): via INTRAVENOUS

## 2016-08-22 MED ORDER — OSELTAMIVIR PHOSPHATE 30 MG PO CAPS: 30.0000 mg | ORAL_CAPSULE | Freq: Every day | ORAL | Status: DC

## 2016-08-22 MED ORDER — BOOST / RESOURCE BREEZE PO LIQD: 1.0000 | Freq: Every day | ORAL | Status: DC | PRN

## 2016-08-22 MED ORDER — OSELTAMIVIR PHOSPHATE 30 MG PO CAPS
30.0000 mg | ORAL_CAPSULE | Freq: Every day | ORAL | Status: DC
  Administered 2016-08-22: 30 mg via ORAL
  Filled 2016-08-22: qty 1

## 2016-08-22 MED ORDER — IPRATROPIUM-ALBUTEROL 0.5-2.5 (3) MG/3ML IN SOLN: 3.0000 mL | RESPIRATORY_TRACT | Status: DC | PRN

## 2016-08-22 NOTE — Addendum Note (Signed)
Addended by: Doroteo Glassman D on: 08/22/2016 01:47 PM   Modules accepted: Orders

## 2016-08-22 NOTE — Progress Notes (Signed)
Initial Nutrition Assessment  DOCUMENTATION CODES:   Non-severe (moderate) malnutrition in context of acute illness/injury  INTERVENTION:  Boost Breeze once a day PRN, this supplement provides 250 kcal, 9 grams of Protein.   NUTRITION DIAGNOSIS:   Inadequate oral intake related to poor appetite, acute illness as evidenced by pt report.   GOAL:   Patient will meet greater than or equal to 90% of their needs    MONITOR:   PO intake, I & O's, Weight trends, Labs  REASON FOR ASSESSMENT:   Malnutrition Screening Tool    ASSESSMENT:    81 y.o. male with medical history significant of colon polyps, anemia, asthma, BPH, bronchiectasis, Wegener granulomatosis, CAD, cataracts, diverticulosis, femoral pseudoaneurysm, GERD, esophageal peptic stricture, hypertension who is coming to the emergency department with complaints of productive progressively worse fatigue, decreased appetite, productive cough, sore throat, myalgias and arthralgias since Thursday evening and fever of 101.80F since earlier today at home. He denies sick contacts or travel history, but was out of the [employer] on Wednesday. He is up-to-date with his influenza and pneumonia vaccines.  Pt BMI is 18.4 (underweight). Pt reported UBW between 120-122 lbs (last reading was 117 lb (53.3kg). Pt reported poor intake since admission but normal appetite PTA. Confirmed from chart review pt has lost 5 pounds in past week (5% weight loss, significant for time frame). Pt reported he has eaten banana pudding and soup today and is feeling better with less n/v. NFPE found mild/moderate depletion in clavicle bone, acromion bone, patellar, anterior thigh, and posterior calf regions.   Labs reviewed: GFR 33, ALT 13, Creatinine 1.85, BUN 34, Chloride 113, Calcium 7,2   Meds reviewed: Plavix 75 mg, Dulocolax (5 mg, 2 x/d), Rocaltrol 0.25 mcg, Colace 100 mg (100 mg, 2x/d), Tamiflu (30 mg)    Diet Order:  Diet Heart Room service  appropriate? Yes; Fluid consistency: Thin  Skin:  Reviewed, no issues  Last BM:  1/15  Height:   Ht Readings from Last 1 Encounters:  08/21/16 5\' 7"  (1.702 m)    Weight:   Wt Readings from Last 1 Encounters:  08/21/16 117 lb 8.1 oz (53.3 kg)    Ideal Body Weight:  67 kg  BMI:  Body mass index is 18.4 kg/m.  Estimated Nutritional Needs:   Kcal:  1440-1600 (27-30 kcal/kg)  Protein:  55-65 grams Fluid:  >/=1.5 L/day  EDUCATION NEEDS:   No education needs identified at this time  Juliann Pulse M.S. Nutrition Dietetic Intern

## 2016-08-22 NOTE — Progress Notes (Signed)
PROGRESS NOTE    Richard [name]  [id number] DOB: May 28, 1936 DOA: 08/21/2016 PCP: Alysia Penna, MD    Brief Narrative:  Richard [name] is a 81 y.o. male with medical history significant of colon polyps, anemia, asthma, BPH, bronchiectasis, Wegener granulomatosis, CAD, cataracts, diverticulosis, femoral pseudoaneurysm, GERD, esophageal peptic stricture, hypertension who is coming to the emergency department with complaints of productive progressively worse fatigue, decreased appetite, productive cough, sore throat, myalgias and arthralgias since Thursday evening and fever of 101.78F since earlier today at home. He denies sick contacts or travel history, but was out of the [employer] on Wednesday. He is up-to-date with his influenza and pneumonia vaccines.  ED Course: The patient received supplemental oxygen, normal saline boluses and bronchodilators reporting relief. His workup showed WBC of 8.8, hemoglobin 10.1 g/dL, platelets of 151. His chemistries showed a BUN of 41, creatinine 2.21 calcium of 7.9 mg/dL. Chest radiographs show a stable interstitial fibrosis without acute abnormality. An influenza by PCR swab was positive for influenza A.   Assessment & Plan:   Principal Problem:   Influenza A Active Problems:   Hyperlipidemia   GERD   BPH (benign prostatic hyperplasia)   Hypertension   CAD- severe 4 V CAD- not CABG candidate   Anemia   Wegener's granulomatosis with renal involvement (HCC)   Aortic valve stenosis   Malnutrition of moderate degree   Influenza A Droplet and contact precautions. Discontinue Cytoxan at this time. Continue supplemental oxygen. Continue bronchodilators and mucolytic's. Start Tamiflu 75 mg by mouth twice a day x 5 days  Hyperlipidemia Can restart statin when patient taking good PO  GERD Pantoprazole 40 mg by mouth daily.  BPH (benign prostatic hyperplasia) Continue Flomax and finasteride.  Hypertension Metoprolol 25 mg by mouth  daily. Monitor blood pressure.  CAD- severe 4 V CAD- not CABG candidate Continue aspirin 81 mg by mouth daily. Continue clopidogrel 75 mg by mouth daily. Continue metoprolol succinate 25 mg by mouth daily. Sublingual nitroglycerin as needed for angina.  Anemia Monitor hematocrit and hemoglobin Patient reports he is still having dark stools Recently had colonoscopy and endoscopy Repeat H/H q12h Will order 2nd large bore IV placement  Wegener's granulomatosis with renal involvement ([place]) Hold Cytoxan while the patient has the flu. Continue prednisone 7.5 mg by mouth daily. Continue bronchodilators and mucolytic's.  Aortic valve stenosis Per patient, he is scheduled for valve replacement in March.    DVT prophylaxis: Lovenox SQ. Code Status: Full code. Family Communication: No family bedside Disposition Plan: inpatient    Consultants:   None  Procedures:   None  Antimicrobials:   Tamiflu    Subjective: Patient laying in bed this am.  Says he feels "wiped".  Reports that he does not feel like eating much.  Reports he started having black stools about a week and a half after discharge from hospital.  He is on plavix.  Objective: Vitals:   08/22/16 0411 08/22/16 0820 08/22/16 1021 08/22/16 1358  BP: 104/61  122/65 (!) 98/55  Pulse: 97  (!) 107 99  Resp: 19   18  Temp: 99.6 F (37.6 C)   99.7 F (37.6 C)  TempSrc: Oral   Oral  SpO2: 99% 98%  98%  Weight:      Height:        Intake/Output Summary (Last 24 hours) at 08/22/16 1623 Last data filed at 08/22/16 1400  Gross per 24 hour  Intake          2063.75  ml  Output                0 ml  Net          2063.75 ml   Filed Weights   08/21/16 1457 08/21/16 2315  Weight: 55.3 kg (122 lb) 53.3 kg (117 lb 8.1 oz)    Examination:  General exam: Appears calm and comfortable  Respiratory system: Clear to auscultation. Respiratory effort normal. Cardiovascular system: S1 & S2 heard, RRR. III/VI  systolic murmur heard best at aortic area No JVD, rubs, gallops or clicks. No pedal edema. Gastrointestinal system: Abdomen is nondistended, soft and nontender. No organomegaly or masses felt. Normal bowel sounds heard. Central nervous system: Alert and oriented. No focal neurological deficits. Extremities: Symmetric 5 x 5 power. Skin: No rashes, lesions or ulcers, darkened forearms bilaterally Psychiatry: Judgement and insight appear normal. Mood & affect appropriate.     Data Reviewed: I have personally reviewed following labs and imaging studies  CBC:  Recent Labs Lab 08/16/16 1221 08/21/16 1806 08/22/16 0549  WBC  --  8.8 4.9  NEUTROABS  --  7.8* 4.5  HGB 10.9* 10.1* 9.2*  HCT  --  31.6* 29.4*  MCV  --  108.2* 109.7*  PLT  --  151 0000000*   Basic Metabolic Panel:  Recent Labs Lab 08/21/16 1806 08/22/16 0549  NA 141 143  K 4.5 4.4  CL 109 113*  CO2 26 23  GLUCOSE 97 93  BUN 41* 34*  CREATININE 2.21* 1.85*  CALCIUM 7.9* 7.2*  MG 1.8  --   PHOS 3.1  --    GFR: Estimated Creatinine Clearance: 24.8 mL/min (by C-G formula based on SCr of 1.85 mg/dL (H)). Liver Function Tests:  Recent Labs Lab 08/22/16 0549  AST 20  ALT 13*  ALKPHOS 54  BILITOT 0.8  PROT 4.7*  ALBUMIN 2.7*   No results for input(s): LIPASE, AMYLASE in the last 168 hours. No results for input(s): AMMONIA in the last 168 hours. Coagulation Profile:  Recent Labs Lab 08/22/16 0549  INR 1.71   Cardiac Enzymes: No results for input(s): CKTOTAL, CKMB, CKMBINDEX, TROPONINI in the last 168 hours. BNP (last 3 results) No results for input(s): PROBNP in the last 8760 hours. HbA1C: No results for input(s): HGBA1C in the last 72 hours. CBG: No results for input(s): GLUCAP in the last 168 hours. Lipid Profile: No results for input(s): CHOL, HDL, LDLCALC, TRIG, CHOLHDL, LDLDIRECT in the last 72 hours. Thyroid Function Tests: No results for input(s): TSH, T4TOTAL, FREET4, T3FREE, THYROIDAB in the  last 72 hours. Anemia Panel: No results for input(s): VITAMINB12, FOLATE, FERRITIN, TIBC, IRON, RETICCTPCT in the last 72 hours. Sepsis Labs:  Recent Labs Lab 08/21/16 2130 08/21/16 2351  LATICACIDVEN 0.8 0.8    Recent Results (from the past 240 hour(s))  Rapid strep screen     Status: None   Collection Time: 08/21/16  6:06 PM  Result Value Ref Range Status   Streptococcus, Group A Screen (Direct) NEGATIVE NEGATIVE Final    Comment: (NOTE) A Rapid Antigen test may result negative if the antigen level in the sample is below the detection level of this test. The FDA has not cleared this test as a stand-alone test therefore the rapid antigen negative result has reflexed to a Group A Strep culture.          Radiology Studies: Dg Chest 2 View  Result Date: 08/21/2016 CLINICAL DATA:  Productive cough. Shortness of breath. Weakness. Fever for  the past 2 days. EXAM: CHEST  2 VIEW COMPARISON:  Chest radiographs and CT dated 07/30/2016. FINDINGS: Normal sized heart. Aortic arch calcifications. Moderate-sized hiatal hernia. Stable coarse interstitial prominence throughout both lungs. Cholecystectomy clips. Diffuse osteopenia. IMPRESSION: 1. No acute abnormality. 2. Stable interstitial fibrosis. 3. Stable moderate-sized hiatal hernia. 4. Aortic atherosclerosis. Electronically Signed   By: Claudie Revering M.D.   On: 08/21/2016 15:39        Scheduled Meds: . aspirin EC  81 mg Oral Daily  . bisacodyl  5 mg Oral BID  . calcitRIOL  0.25 mcg Oral Daily  . clopidogrel  75 mg Oral Daily  . docusate sodium  100 mg Oral BID  . finasteride  5 mg Oral Daily  . loratadine  10 mg Oral Daily  . metoprolol succinate  25 mg Oral Daily  . mometasone-formoterol  2 puff Inhalation BID  . oseltamivir  30 mg Oral QHS  . pantoprazole  40 mg Oral Daily  . predniSONE  7.5 mg Oral Q breakfast  . sodium bicarbonate  1,300 mg Oral BID  . tamsulosin  0.4 mg Oral QPC supper   Continuous Infusions: .  sodium chloride 75 mL/hr at 08/22/16 0421     LOS: 1 day    Time spent: 35 minutes    Loretha Stapler, MD [facility] Pager [phone]  If 7PM-7AM, please contact night-coverage [email or web address] Password [facility] 08/22/2016, 4:23 PM

## 2016-08-23 ENCOUNTER — Inpatient Hospital Stay (HOSPITAL_COMMUNITY): Admission: RE | Admit: 2016-08-23 | Payer: Medicare Other | Source: Ambulatory Visit

## 2016-08-23 DIAGNOSIS — N400 Enlarged prostate without lower urinary tract symptoms: Secondary | ICD-10-CM

## 2016-08-23 LAB — BASIC METABOLIC PANEL
ANION GAP: 6 (ref 5–15)
BUN: 29 mg/dL — ABNORMAL HIGH (ref 6–20)
CHLORIDE: 115 mmol/L — AB (ref 101–111)
CO2: 22 mmol/L (ref 22–32)
Calcium: 7.4 mg/dL — ABNORMAL LOW (ref 8.9–10.3)
Creatinine, Ser: 1.82 mg/dL — ABNORMAL HIGH (ref 0.61–1.24)
GFR calc non Af Amer: 34 mL/min — ABNORMAL LOW (ref >60)
GFR, EST AFRICAN AMERICAN: 39 mL/min — AB (ref >60)
Glucose, Bld: 99 mg/dL (ref 65–99)
Potassium: 4.2 mmol/L (ref 3.5–5.1)
Sodium: 143 mmol/L (ref 135–145)

## 2016-08-23 LAB — CBC WITH DIFFERENTIAL/PLATELET
BASOS ABS: 0 10*3/uL (ref 0.0–0.1)
BASOS PCT: 0 %
Eosinophils Absolute: 0 10*3/uL (ref 0.0–0.7)
Eosinophils Relative: 1 %
HEMATOCRIT: 29.1 % — AB (ref 39.0–52.0)
HEMOGLOBIN: 9 g/dL — AB (ref 13.0–17.0)
LYMPHS PCT: 19 %
Lymphs Abs: 0.6 10*3/uL — ABNORMAL LOW (ref 0.7–4.0)
MCH: 34.1 pg — ABNORMAL HIGH (ref 26.0–34.0)
MCHC: 30.9 g/dL (ref 30.0–36.0)
MCV: 110.2 fL — AB (ref 78.0–100.0)
MONOS PCT: 9 %
Monocytes Absolute: 0.3 10*3/uL (ref 0.1–1.0)
NEUTROS ABS: 2.3 10*3/uL (ref 1.7–7.7)
NEUTROS PCT: 71 %
Platelets: 110 10*3/uL — ABNORMAL LOW (ref 150–400)
RBC: 2.64 MIL/uL — ABNORMAL LOW (ref 4.22–5.81)
RDW: 18 % — ABNORMAL HIGH (ref 11.5–15.5)
WBC: 3.3 10*3/uL — ABNORMAL LOW (ref 4.0–10.5)

## 2016-08-23 MED ORDER — EPOETIN ALFA 20000 UNIT/ML IJ SOLN
30000.0000 [IU] | Freq: Once | INTRAMUSCULAR | Status: AC
  Administered 2016-08-23: 30000 [IU] via SUBCUTANEOUS
  Filled 2016-08-23: qty 2

## 2016-08-23 MED ORDER — OSELTAMIVIR PHOSPHATE 30 MG PO CAPS: 30.0000 mg | ORAL_CAPSULE | Freq: Every day | ORAL | 0 refills | Status: AC

## 2016-08-23 MED ORDER — PANTOPRAZOLE SODIUM 40 MG PO TBEC
40.0000 mg | DELAYED_RELEASE_TABLET | Freq: Every day | ORAL | 3 refills | Status: DC
Start: 2016-08-23 — End: 2016-09-15

## 2016-08-23 MED ORDER — GUAIFENESIN-CODEINE 100-10 MG/5ML PO SOLN: 10.0000 mL | Freq: Four times a day (QID) | ORAL | 0 refills | Status: AC | PRN

## 2016-08-23 MED ORDER — EPOETIN ALFA 40000 UNIT/ML IJ SOLN
30000.0000 [IU] | Freq: Once | INTRAMUSCULAR | Status: DC
  Filled 2016-08-23: qty 1

## 2016-08-23 NOTE — Progress Notes (Signed)
SATURATION QUALIFICATIONS: (This note is used to comply with regulatory documentation for home oxygen)  Patient Saturations on Room Air at Rest = 96%  Patient Saturations on Room Air while Ambulating = 89%  Please briefly explain why patient needs home oxygen: 

## 2016-08-23 NOTE — Evaluation (Signed)
Physical Therapy Evaluation-1x Patient Details Name: MEHTAB EZZELL MRN: [id number] DOB: [birth year] Today's Date: 08/23/2016   History of Present Illness  81 yo male admitted with + influenza A. Hx of HOH, anemia, CAD, HTN, asthma  Clinical Impression  On eval, pt was Mod Ind with mobility. He walked ~300 feet in the hallway. No LOB during session. No acute PT needs. 1x eval. Will sign off.     Follow Up Recommendations No PT follow up    Equipment Recommendations  None recommended by PT    Recommendations for Other Services       Precautions / Restrictions Precautions Precautions: Other (comment) Precaution Comments: droplet Restrictions Weight Bearing Restrictions: No      Mobility  Bed Mobility Overal bed mobility: Modified Independent                Transfers Overall transfer level: Modified independent                  Ambulation/Gait Ambulation/Gait assistance: Modified independent (Device/Increase time) Ambulation Distance (Feet): 300 Feet Assistive device: None Gait Pattern/deviations: Step-through pattern        Stairs            Wheelchair Mobility    Modified Rankin (Stroke Patients Only)       Balance Overall balance assessment: No apparent balance deficits (not formally assessed)                                           Pertinent Vitals/Pain Pain Assessment: No/denies pain    Home Living Family/patient expects to be discharged to:: Private residence Living Arrangements: Spouse/significant other Available Help at Discharge: Family Type of Home: House Home Access: Stairs to enter   Technical brewer of Steps: 2 Home Layout: One level Home Equipment: None      Prior Function Level of Independence: Independent               Hand Dominance        Extremity/Trunk Assessment   Upper Extremity Assessment Upper Extremity Assessment: Overall WFL for tasks assessed    Lower  Extremity Assessment Lower Extremity Assessment: Overall WFL for tasks assessed    Cervical / Trunk Assessment Cervical / Trunk Assessment: Normal  Communication   Communication: HOH  Cognition Arousal/Alertness: Awake/alert Behavior During Therapy: WFL for tasks assessed/performed Overall Cognitive Status: Within Functional Limits for tasks assessed                      General Comments      Exercises     Assessment/Plan    PT Assessment Patent does not need any further PT services  PT Problem List            PT Treatment Interventions      PT Goals (Current goals can be found in the Care Plan section)  Acute Rehab PT Goals Patient Stated Goal: to get better PT Goal Formulation: All assessment and education complete, DC therapy    Frequency     Barriers to discharge        Co-evaluation               End of Session   Activity Tolerance: Patient tolerated treatment well Patient left: in bed;with call bell/phone within reach           Time: [id number] PT Time  Calculation (min) (ACUTE ONLY): 8 min   Charges:   PT Evaluation $PT Eval Low Complexity: 1 Procedure     PT G Codes:        Weston Anna, MPT Pager: [phone]

## 2016-08-23 NOTE — Discharge Summary (Signed)
Physician Discharge Summary  [name] [id number] DOB: [birth year] DOA: 08/21/2016  PCP: Richard Penna, MD  Admit date: 08/21/2016 Discharge date: 08/23/2016  Admitted From: Home Disposition:  Home  Recommendations for Outpatient Follow-up:  1. Follow up with PCP in 1-2 weeks 2. Please obtain BMP/CBC in one week 3. Take prescriptions as prescribed 4. Return to hospital with any concerns or worsening symptoms   Home Health: No Equipment/Devices: None   Discharge Condition:Stable CODE STATUS: Full code Diet recommendation: Heart Healthy   Brief/Interim Summary: Richard Ohta Wallaceis a 81 y.o.malewith medical history significant of colon polyps, anemia, asthma, BPH, bronchiectasis, Wegener granulomatosis, CAD, cataracts, diverticulosis, femoral pseudoaneurysm, GERD, esophageal peptic stricture, hypertension who is coming to the emergency department with complaints of productive progressively worse fatigue, decreased appetite, productive cough, sore throat, myalgias and arthralgias since Thursday evening and fever of 101.19F since earlier today at home. He denies sick contacts or travel history, but was out of the [employer] on Wednesday. He is up-to-date with his influenza and pneumonia vaccines.  ED Course:The patient received supplemental oxygen, normal saline boluses and bronchodilators reporting relief. His workup showed WBC of 8.8, hemoglobin 10.1 g/dL, platelets of 151. His chemistries showed a BUN of 41, creatinine 2.21 calcium of 7.9 mg/dL. Chest radiographs show a stable interstitial fibrosis without acute abnormality.An influenza by PCR swab was positive for influenza A.   Patient was titrated off oxygen and was able to tolerate PO prior to discharge.  He was instructed to take tamiflu based on his renal function and was stable for dischargeo n 08/23/16.   Discharge Diagnoses:  Principal Problem:   Influenza A Active Problems:   Hyperlipidemia   GERD   BPH  (benign prostatic hyperplasia)   Hypertension   CAD- severe 4 V CAD- not CABG candidate   Anemia   Wegener's granulomatosis with renal involvement ([place])   Aortic valve stenosis   Malnutrition of moderate degree    Discharge Instructions  Discharge Instructions    Call MD for:  difficulty breathing, headache or visual disturbances    Complete by:  As directed    Call MD for:  extreme fatigue    Complete by:  As directed    Call MD for:  hives    Complete by:  As directed    Call MD for:  persistant dizziness or light-headedness    Complete by:  As directed    Call MD for:  persistant nausea and vomiting    Complete by:  As directed    Call MD for:  severe uncontrolled pain    Complete by:  As directed    Call MD for:  temperature >100.4    Complete by:  As directed    Diet - low sodium heart healthy    Complete by:  As directed    Discharge instructions    Complete by:  As directed    Take prescriptions as prescribed Follow up with PCP in 1-2 weeks Return to hospital if worsening   Increase activity slowly    Complete by:  As directed      Allergies as of 08/23/2016   No Known Allergies     Medication List    TAKE these medications   albuterol 108 (90 Base) MCG/ACT inhaler Commonly known as:  VENTOLIN HFA Inhale 1-2 puffs into the lungs every 4 (four) hours as needed for wheezing or shortness of breath.   aspirin EC 81 MG tablet Take 1 tablet (81 mg total)  by mouth daily.   b complex vitamins tablet Take 1 tablet by mouth daily.   bisacodyl 5 MG EC tablet Commonly known as:  DULCOLAX Take 5 mg by mouth 2 (two) times daily.   calcitRIOL 0.25 MCG capsule Commonly known as:  ROCALTROL Take 0.25 mcg by mouth daily.   CITRUCEL PO Take 1 packet by mouth daily.   clopidogrel 75 MG tablet Commonly known as:  PLAVIX Take 1 tablet (75 mg total) by mouth daily. TAKE ONE TABLET BY MOUTH ONCE DAILY   cyclophosphamide 25 MG capsule Commonly known as:   CYTOXAN Take 25 mg by mouth daily. Take one capsule by mouth daily on an empty stomach 1 hour before or 2 hours after meal.   dextromethorphan-guaiFENesin 30-600 MG 12hr tablet Commonly known as:  MUCINEX DM Take 1 tablet by mouth 2 (two) times daily.   finasteride 5 MG tablet Commonly known as:  PROSCAR TAKE ONE TABLET BY MOUTH ONCE DAILY What changed:  See the new instructions.   guaiFENesin-codeine 100-10 MG/5ML syrup Take 10 mLs by mouth every 6 (six) hours as needed for cough.   ipratropium-albuterol 0.5-2.5 (3) MG/3ML Soln Commonly known as:  DUONEB Take 3 mLs by nebulization every 4 (four) hours as needed (if inhaler is not effective in resolving shortness of breath or wheezing). What changed:  reasons to take this   loratadine 10 MG tablet Commonly known as:  CLARITIN Take 10 mg by mouth daily.   metoprolol succinate 25 MG 24 hr tablet Commonly known as:  TOPROL-XL Take 1 tablet (25 mg total) by mouth daily.   nitroGLYCERIN 0.4 MG SL tablet Commonly known as:  NITROSTAT Place 1 tablet (0.4 mg total) under the tongue every 5 (five) minutes x 3 doses as needed for chest pain.   oseltamivir 30 MG capsule Commonly known as:  TAMIFLU Take 1 capsule (30 mg total) by mouth at bedtime.   pantoprazole 40 MG tablet Commonly known as:  PROTONIX Take 1 tablet (40 mg total) by mouth daily.   predniSONE 5 MG tablet Commonly known as:  DELTASONE Take 7.5 mg by mouth daily with breakfast.   ranitidine 300 MG tablet Commonly known as:  ZANTAC Take 300 mg by mouth 2 (two) times daily.   simethicone 125 MG chewable tablet Commonly known as:  MYLICON Chew 0000000 mg by mouth every 6 (six) hours as needed for flatulence. TAKE PER BOX FOR GAS   simvastatin 20 MG tablet Commonly known as:  ZOCOR TAKE ONE TABLET BY MOUTH ONCE DAILY AT  6  PM   sodium bicarbonate 650 MG tablet Take 2 tablets (1,300 mg total) by mouth 2 (two) times daily. NEED OV.   sodium chloride 0.65 % Soln  nasal spray Commonly known as:  OCEAN Place 1 spray into both nostrils as needed for congestion.   SYMBICORT 160-4.5 MCG/ACT inhaler Generic drug:  budesonide-formoterol INHALE TWO PUFFS BY MOUTH TWICE DAILY   tamsulosin 0.4 MG Caps capsule Commonly known as:  FLOMAX TAKE ONE CAPSULE BY MOUTH ONCE DAILY IN THE EVENING   TYLENOL 325 MG Caps Generic drug:  Acetaminophen Take 325 mg by mouth every 6 (six) hours as needed (pain). TAKE PER BOX AS NEEDED FOR PAIN       No Known Allergies  Consultations:  None   Procedures/Studies: Dg Chest 2 View  Result Date: 08/21/2016 CLINICAL DATA:  Productive cough. Shortness of breath. Weakness. Fever for the past 2 days. EXAM: CHEST  2 VIEW COMPARISON:  Chest radiographs and CT dated 07/30/2016. FINDINGS: Normal sized heart. Aortic arch calcifications. Moderate-sized hiatal hernia. Stable coarse interstitial prominence throughout both lungs. Cholecystectomy clips. Diffuse osteopenia. IMPRESSION: 1. No acute abnormality. 2. Stable interstitial fibrosis. 3. Stable moderate-sized hiatal hernia. 4. Aortic atherosclerosis. Electronically Signed   By: Claudie Revering M.D.   On: 08/21/2016 15:39   Dg Chest 2 View  Result Date: 07/30/2016 CLINICAL DATA:  Shortness of breath with exertion EXAM: CHEST  2 VIEW COMPARISON:  06/09/2016 FINDINGS: Cardiac shadow is stable. Hiatal hernia is again seen and stable. Patchy fibrotic changes are noted bilaterally stable from the prior exam. Bronchial dilatation consistent with bronchiectasis seen previously is noted. No acute bony abnormality is seen. IMPRESSION: Chronic changes without acute abnormality. Electronically Signed   By: Inez Catalina M.D.   On: 07/30/2016 15:06   Ct Chest Wo Contrast  Result Date: 07/30/2016 CLINICAL DATA:  Weakness and shortness of Breath EXAM: CT CHEST WITHOUT CONTRAST TECHNIQUE: Multidetector CT imaging of the chest was performed following the standard protocol without IV contrast.  COMPARISON:  01/07/2016 FINDINGS: Cardiovascular: Somewhat limited by the lack of IV contrast. Diffuse aortic calcifications are seen without aneurysmal dilatation. Heavy coronary calcifications are seen. The cardiac structures are otherwise within normal limits. Mediastinum/Nodes: Large hiatal hernia is again identified. No significant hilar or mediastinal adenopathy is noted. The thoracic inlet is within normal limits. Lungs/Pleura: Changes of bronchiectasis are again seen in the lower lobes bilaterally. The degree of mucous plugging has improved significantly in the interval from the prior exam. Chronic fibrotic changes are seen bilaterally. Some stable areas of nodularity are noted particularly in the right lower lobe but to a lesser degree in the right upper lobe. These are stable in appearance from the prior exam. Continued follow-up. Upper Abdomen:  No acute abnormality noted. Musculoskeletal: No chest wall mass or suspicious bone lesions identified. IMPRESSION: Changes of bronchiectasis predominately in the lower lobes bilaterally. The degree of mucous plugging has improved from the prior exam. Persistent postinflammatory changes in the right lower lobe and to a lesser degree in right upper lobe stable from the previous exam. No new focal abnormality is seen. Large stable hiatal hernia. Electronically Signed   By: Inez Catalina M.D.   On: 07/30/2016 15:38      Subjective: Patient seen during bedside rounds.  He is feeling well and eating well.  He is able to ambulate to and from the bathroom and is asking to leave.  Patient voices no concerns.  Has not had a bowel movement since prior to admission.  Discharge Exam: Vitals:   08/23/16 0619 08/23/16 1250  BP: 104/69 113/68  Pulse: 85 92  Resp: 18 18  Temp: 99.1 F (37.3 C) 99.8 F (37.7 C)   Vitals:   08/23/16 0619 08/23/16 0804 08/23/16 1207 08/23/16 1250  BP: 104/69   113/68  Pulse: 85   92  Resp: 18   18  Temp: 99.1 F (37.3 C)   99.8  F (37.7 C)  TempSrc: Oral   Oral  SpO2: 100% 98% 96% 97%  Weight:      Height:        General: Pt is alert, awake, not in acute distress Cardiovascular: RRR, [id number] +, III/VI systolic murmur at aortic area, no rubs, no gallops Respiratory: CTA bilaterally, no wheezing, no rhonchi Abdominal: Soft, NT, ND, bowel sounds + Extremities: no edema, no cyanosis    The results of significant diagnostics from this hospitalization (including imaging, microbiology, ancillary  and laboratory) are listed below for reference.     Microbiology: Recent Results (from the past 240 hour(s))  Rapid strep screen     Status: None   Collection Time: 08/21/16  6:06 PM  Result Value Ref Range Status   Streptococcus, Group A Screen (Direct) NEGATIVE NEGATIVE Final    Comment: (NOTE) A Rapid Antigen test may result negative if the antigen level in the sample is below the detection level of this test. The FDA has not cleared this test as a stand-alone test therefore the rapid antigen negative result has reflexed to a Group A Strep culture.   Culture, group A strep     Status: None (Preliminary result)   Collection Time: 08/21/16  6:06 PM  Result Value Ref Range Status   Specimen Description THROAT  Final   Special Requests NONE Reflexed from [id number]  Final   Culture   Final    CULTURE REINCUBATED FOR BETTER GROWTH Performed at [facility], [address], [place], [place] [address]    Report Status PENDING  Incomplete     Labs: BNP (last 3 results)  Recent Labs  01/07/16 1540 07/30/16 1455  BNP 151.7* [id number]*   Basic Metabolic Panel:  Recent Labs Lab 08/21/16 1806 08/22/16 0549 08/23/16 0537  NA 141 143 143  K 4.5 4.4 4.2  CL 109 113* 115*  CO2 26 23 22   GLUCOSE 97 93 99  BUN 41* 34* 29*  CREATININE 2.21* 1.85* 1.82*  CALCIUM 7.9* 7.2* 7.4*  MG 1.8  --   --   PHOS 3.1  --   --    Liver Function Tests:  Recent Labs Lab 08/22/16 0549  AST 20  ALT 13*  ALKPHOS 54   BILITOT 0.8  PROT 4.7*  ALBUMIN 2.7*   No results for input(s): LIPASE, AMYLASE in the last 168 hours. No results for input(s): AMMONIA in the last 168 hours. CBC:  Recent Labs Lab 08/21/16 1806 08/22/16 0549 08/22/16 1741 08/22/16 2304 08/23/16 0537  WBC 8.8 4.9  --  3.5* 3.3*  NEUTROABS 7.8* 4.5  --   --  2.3  HGB 10.1* 9.2* 9.0* 8.5* 9.0*  HCT 31.6* 29.4* 29.1* 26.8* 29.1*  MCV 108.2* 109.7*  --  106.8* 110.2*  PLT 151 125*  --  123* 110*   Cardiac Enzymes: No results for input(s): CKTOTAL, CKMB, CKMBINDEX, TROPONINI in the last 168 hours. BNP: Invalid input(s): POCBNP CBG: No results for input(s): GLUCAP in the last 168 hours. D-Dimer No results for input(s): DDIMER in the last 72 hours. Hgb A1c No results for input(s): HGBA1C in the last 72 hours. Lipid Profile No results for input(s): CHOL, HDL, LDLCALC, TRIG, CHOLHDL, LDLDIRECT in the last 72 hours. Thyroid function studies No results for input(s): TSH, T4TOTAL, T3FREE, THYROIDAB in the last 72 hours.  Invalid input(s): FREET3 Anemia work up No results for input(s): VITAMINB12, FOLATE, FERRITIN, TIBC, IRON, RETICCTPCT in the last 72 hours. Urinalysis    Component Value Date/Time   COLORURINE YELLOW 08/21/2016 1806   APPEARANCEUR CLEAR 08/21/2016 1806   LABSPEC 1.015 08/21/2016 1806   PHURINE 8.0 08/21/2016 1806   GLUCOSEU NEGATIVE 08/21/2016 1806   GLUCOSEU NEGATIVE 07/26/2013 1142   HGBUR NEGATIVE 08/21/2016 1806   HGBUR trace-intact 11/13/2008 0000   BILIRUBINUR NEGATIVE 08/21/2016 1806   BILIRUBINUR neg 05/16/2012 1300   KETONESUR NEGATIVE 08/21/2016 1806   PROTEINUR 30 (A) 08/21/2016 1806   UROBILINOGEN 0.2 03/25/2014 1637   NITRITE NEGATIVE 08/21/2016  [place] 08/21/2016 1806   Sepsis Labs Invalid input(s): PROCALCITONIN,  WBC,  LACTICIDVEN Microbiology Recent Results (from the past 240 hour(s))  Rapid strep screen     Status: None   Collection Time: 08/21/16  6:06 PM   Result Value Ref Range Status   Streptococcus, Group A Screen (Direct) NEGATIVE NEGATIVE Final    Comment: (NOTE) A Rapid Antigen test may result negative if the antigen level in the sample is below the detection level of this test. The FDA has not cleared this test as a stand-alone test therefore the rapid antigen negative result has reflexed to a Group A Strep culture.   Culture, group A strep     Status: None (Preliminary result)   Collection Time: 08/21/16  6:06 PM  Result Value Ref Range Status   Specimen Description THROAT  Final   Special Requests NONE Reflexed from [id number]  Final   Culture   Final    CULTURE REINCUBATED FOR BETTER GROWTH Performed at [facility], [address] [place], [place] [address]    Report Status PENDING  Incomplete     Time coordinating discharge: Over 30 minutes  SIGNED:   Loretha Stapler, MD  [facility] 08/23/2016, 4:01 PM Pager [phone] If 7PM-7AM, please contact night-coverage [email or web address] Password [email or web address]

## 2016-08-24 LAB — CULTURE, GROUP A STREP (THRC)

## 2016-08-29 ENCOUNTER — Other Ambulatory Visit: Payer: Self-pay | Admitting: Family Medicine

## 2016-08-29 NOTE — ED Provider Notes (Signed)
[facility] DEPT Provider Note   CSN: [id number] Arrival date & time: 08/21/16  1445     History   Chief Complaint Chief Complaint  Patient presents with  . URI    HPI Richard [name] is a 81 y.o. male.  HPI   81 y.o. male with medical history significant of colon polyps, anemia, asthma, BPH, bronchiectasis, Wegener granulomatosis, CAD, cataracts, diverticulosis, femoral pseudoaneurysm, GERD, esophageal peptic stricture, hypertension who is coming to the emergency department with complaints of productive progressively worse fatigue, decreased appetite, productive cough, sore throat, myalgias and arthralgias since Thursday evening and fever of 101.21F since earlier today at home. He   Past Medical History:  Diagnosis Date  . Adenomatous colon polyp   . Anemia   . Asthma   . BPH (benign prostatic hyperplasia)    sees Dr. Risa Grill, biopsy June 2015 was benign   . Bronchiectasis ([place])   . CAD (coronary artery disease)    a. [place] s/p [place] x3 and ultimately BMS to pLAD& POBA to CTO of circumflex/marginal vessel on 03/17/14 and staged BMS to [facility] on 03/21/14  . Cataract   . Diverticulosis   . Femoral artery pseudo-aneurysm, right ([place])    a. s/p repair. Follow up with Dr. Kellie Simmering   . Fungal infection    lungs  . GERD (gastroesophageal reflux disease)   . Hiatal hernia   . HOH (hard of hearing)    bilaterally  . HTN (hypertension) 09/28/2013  . On home oxygen therapy    uses 2 l/m nasally at bedtime  . Peptic stricture of esophagus   . Wegener's granulomatosis ([place])    sees Dr. [name]     Patient Active Problem List   Diagnosis Date Noted  . Malnutrition of moderate degree 08/22/2016  . Influenza A 08/21/2016  . AVM (arteriovenous malformation) of colon with hemorrhage   . Symptomatic anemia 07/30/2016  . Gastrointestinal hemorrhage with melena 07/30/2016  . Aortic valve stenosis 07/08/2016  . Protein-calorie malnutrition, severe ([place]) 04/21/2015  . CAP (community  acquired pneumonia) 04/18/2015  . Chronic kidney disease, stage IV (severe) ([place]) 03/26/2015  . Wegener's granulomatosis with renal involvement ([place]) 07/15/2014  . Pseudoaneurysm of right femoral artery ([place]) 04/08/2014  . CAD- severe 4 V CAD- not CABG candidate 03/17/2014  . Anemia 03/17/2014  . Cardiomyopathy, ischemic- EF 40% by [facility] 03/17/2014  . Unstable Class III Angina 03/13/2014  . Abnormal nuclear stress test: Intermediate risk with moderate region of apical and inferior scar with mild superimposed ischemia; EF 40% 03/12/2014  . Loss of weight 12/12/2013  . Abdominal pain, unspecified site 12/12/2013  . SVT (supraventricular tachycardia) ([place]) 10/04/2013  . SOB (shortness of breath) 10/04/2013  . Weakness generalized 10/04/2013  . Hypertension 09/28/2013  . Chronic rhinitis 09/27/2013  . Chronic respiratory failure ([place]) 12/26/2012  . Bronchiectasis with acute exacerbation ([place]) 09/28/2011  . OTHER DYSPHAGIA 10/25/2010  . Wegener's granulomatosis ([place]) 05/10/2010  . GERD 03/10/2010  . ARTHRITIS, CERVICAL SPINE 03/10/2010  . Hypothyroidism 11/13/2008  . VITAMIN D DEFICIENCY 11/13/2008  . Hyperlipidemia 11/13/2008  . ANEMIA 11/13/2008  . BPH (benign prostatic hyperplasia) 12/04/2007  . Obstructive bronchiectasis ([place]) 01/25/2007    Past Surgical History:  Procedure Laterality Date  . CARDIAC CATHETERIZATION  03/17/2014   Procedure: CORONARY BALLOON ANGIOPLASTY;  Surgeon: Troy Sine, MD;  Location: [facility] CATH LAB;  Service: Cardiovascular;;  . CATARACT EXTRACTION, BILATERAL  12-18-12   bilateral  . CHOLECYSTECTOMY N/A 12/21/2012   Procedure: LAPAROSCOPIC CHOLECYSTECTOMY WITH  INTRAOPERATIVE CHOLANGIOGRAM;  Surgeon: Edward Jolly, MD;  Location: [facility] ORS;  Service: General;  Laterality: N/A;  . COLONOSCOPY  08-16-11   per Dr. Fuller Plan, diverticulosis and polyps, repeat in 5 yrs   . COLONOSCOPY N/A 08/01/2016   Procedure: COLONOSCOPY;  Surgeon: Carol Ada, MD;  Location:  [facility] ENDOSCOPY;  Service: Endoscopy;  Laterality: N/A;  . CORONARY ANGIOPLASTY  03/2014  . ESOPHAGOGASTRODUODENOSCOPY N/A 07/31/2016   Procedure: ESOPHAGOGASTRODUODENOSCOPY (EGD);  Surgeon: Carol Ada, MD;  Location: [facility] ENDOSCOPY;  Service: Endoscopy;  Laterality: N/A;  . ESOPHAGOGASTRODUODENOSCOPY (EGD) WITH ESOPHAGEAL DILATION  11-29-10   per Dr. Fuller Plan   . EYE SURGERY     cataracts removed.   Marland Kitchen HEMATOMA EVACUATION Right 03/19/2014   Procedure: Suture repair of femoral artery with evacuation of hematoma;  Surgeon: Mal Misty, MD;  Location: [facility];  Service: Vascular;  Laterality: Right;  . HERNIA REPAIR    . LEFT HEART CATHETERIZATION WITH CORONARY ANGIOGRAM N/A 03/14/2014   Procedure: LEFT HEART CATHETERIZATION WITH CORONARY ANGIOGRAM;  Surgeon: Leonie Man, MD;  Location: [facility] CATH LAB;  Service: Cardiovascular;  Laterality: N/A;  . PERCUTANEOUS CORONARY STENT INTERVENTION (PCI-S) N/A 03/17/2014   Procedure: PERCUTANEOUS CORONARY STENT INTERVENTION (PCI-S);  Surgeon: Troy Sine, MD;  Location: [facility] CATH LAB;  Service: Cardiovascular;  Laterality: N/A;  . PERCUTANEOUS CORONARY STENT INTERVENTION (PCI-S) N/A 03/21/2014   Procedure: PERCUTANEOUS CORONARY STENT INTERVENTION (PCI-S);  Surgeon: Burnell Blanks, MD;  Location: [facility] CATH LAB;  Service: Cardiovascular;  Laterality: N/A;  . RENAL BIOPSY         Home Medications    Prior to Admission medications   Medication Sig Start Date End Date Taking? Authorizing Provider  Acetaminophen (TYLENOL) 325 MG CAPS Take 325 mg by mouth every 6 (six) hours as needed (pain). TAKE PER BOX AS NEEDED FOR PAIN    Yes Historical Provider, MD  albuterol (VENTOLIN HFA) 108 (90 Base) MCG/ACT inhaler Inhale 1-2 puffs into the lungs every 4 (four) hours as needed for wheezing or shortness of breath. 07/12/16  Yes Tanda Rockers, MD  aspirin EC 81 MG tablet Take 1 tablet (81 mg total) by mouth daily. 08/08/16  Yes Reyne Dumas, MD  b complex vitamins  tablet Take 1 tablet by mouth daily.     Yes Historical Provider, MD  bisacodyl (DULCOLAX) 5 MG EC tablet Take 5 mg by mouth 2 (two) times daily.    Yes Historical Provider, MD  calcitRIOL (ROCALTROL) 0.25 MCG capsule Take 0.25 mcg by mouth daily.  09/06/15  Yes Historical Provider, MD  clopidogrel (PLAVIX) 75 MG tablet Take 1 tablet (75 mg total) by mouth daily. TAKE ONE TABLET BY MOUTH ONCE DAILY 08/05/16  Yes Reyne Dumas, MD  cyclophosphamide (CYTOXAN) 25 MG capsule Take 25 mg by mouth daily. Take one capsule by mouth daily on an empty stomach 1 hour before or 2 hours after meal. 07/26/16  Yes Historical Provider, MD  dextromethorphan-guaiFENesin (MUCINEX DM) 30-600 MG 12hr tablet Take 1 tablet by mouth 2 (two) times daily.   Yes Historical Provider, MD  finasteride (PROSCAR) 5 MG tablet TAKE ONE TABLET BY MOUTH ONCE DAILY Patient taking differently: TAKE 5mg  BY MOUTH ONCE DAILY 05/25/16  Yes [name], MD  ipratropium-albuterol (DUONEB) 0.5-2.5 (3) MG/3ML SOLN Take 3 mLs by nebulization every 4 (four) hours as needed (if inhaler is not effective in resolving shortness of breath or wheezing). Patient taking differently: Take 3 mLs by nebulization every 4 (four)  hours as needed (WITH FLUTTER VALVE FOR WHEEZING, SHORTNESS OF BREATH-PLAN C).  02/03/16  Yes Tanda Rockers, MD  loratadine (CLARITIN) 10 MG tablet Take 10 mg by mouth daily.    Yes Historical Provider, MD  metoprolol succinate (TOPROL-XL) 25 MG 24 hr tablet Take 1 tablet (25 mg total) by mouth daily. 07/18/16  Yes Pixie Casino, MD  nitroGLYCERIN (NITROSTAT) 0.4 MG SL tablet Place 1 tablet (0.4 mg total) under the tongue every 5 (five) minutes x 3 doses as needed for chest pain. 03/24/15  Yes Pixie Casino, MD  predniSONE (DELTASONE) 5 MG tablet Take 7.5 mg by mouth daily with breakfast.   Yes Historical Provider, MD  simethicone (MYLICON) 0000000 MG chewable tablet Chew 125 mg by mouth every 6 (six) hours as needed for flatulence. TAKE  PER BOX FOR GAS    Yes Historical Provider, MD  simvastatin (ZOCOR) 20 MG tablet TAKE ONE TABLET BY MOUTH ONCE DAILY AT  6  PM 07/07/16  Yes Pixie Casino, MD  sodium bicarbonate 650 MG tablet Take 2 tablets (1,300 mg total) by mouth 2 (two) times daily. NEED OV. 08/12/15  Yes Pixie Casino, MD  SYMBICORT 160-4.5 MCG/ACT inhaler INHALE TWO PUFFS BY MOUTH TWICE DAILY 06/01/16  Yes Tanda Rockers, MD  tamsulosin (FLOMAX) 0.4 MG CAPS capsule TAKE ONE CAPSULE BY MOUTH ONCE DAILY IN THE EVENING 07/05/16  Yes [name], MD  guaiFENesin-codeine 100-10 MG/5ML syrup Take 10 mLs by mouth every 6 (six) hours as needed for cough. 08/23/16   Eber Jones, MD  Methylcellulose, Laxative, (CITRUCEL PO) Take 1 packet by mouth daily.    Historical Provider, MD  pantoprazole (PROTONIX) 40 MG tablet Take 1 tablet (40 mg total) by mouth daily. 08/23/16   Eber Jones, MD  ranitidine (ZANTAC) 300 MG tablet Take 300 mg by mouth 2 (two) times daily.    Historical Provider, MD  sodium chloride (OCEAN) 0.65 % SOLN nasal spray Place 1 spray into both nostrils as needed for congestion.    Historical Provider, MD    Family History Family History  Problem Relation Age of Onset  . Asthma Father   . Coronary artery disease Mother   . Coronary artery disease Brother   . Coronary artery disease Brother   . Colon cancer Neg Hx   . Stomach cancer Neg Hx     Social History Social History  Substance Use Topics  . Smoking status: Never Smoker  . Smokeless tobacco: Never Used  . Alcohol use No     Allergies   Patient has no known allergies.   Review of Systems Review of Systems  All systems reviewed and negative, other than as noted in HPI.   Physical Exam Updated Vital Signs BP 113/68 (BP Location: Right Arm)   Pulse 92   Temp 99.8 F (37.7 C) (Oral)   Resp 18   Ht 5\' 7"  (1.702 m)   Wt 117 lb 8.1 oz (53.3 kg)   SpO2 97%   BMI 18.40 kg/m   Physical Exam  Constitutional: He appears  well-developed and well-nourished.  Laying in bed. Appears tired, nontoxic.  HENT:  Head: Normocephalic and atraumatic.  Eyes: Conjunctivae are normal. Right eye exhibits no discharge. Left eye exhibits no discharge.  Neck: Neck supple.  Cardiovascular: Normal rate, regular rhythm and normal heart sounds.  Exam reveals no gallop and no friction rub.   No murmur heard. Pulmonary/Chest: Effort normal and breath sounds normal.  No respiratory distress.  Abdominal: Soft. He exhibits no distension. There is no tenderness.  Musculoskeletal: He exhibits no edema or tenderness.  Neurological: He is alert.  Skin: Skin is warm and dry.  Psychiatric: He has a normal mood and affect. His behavior is normal. Thought content normal.  Nursing note and vitals reviewed.    ED Treatments / Results  Labs (all labs ordered are listed, but only abnormal results are displayed) Labs Reviewed  URINALYSIS, ROUTINE W REFLEX MICROSCOPIC - Abnormal; Notable for the following:       Result Value   Protein, ur 30 (*)    All other components within normal limits  CBC WITH DIFFERENTIAL/PLATELET - Abnormal; Notable for the following:    RBC 2.92 (*)    Hemoglobin 10.1 (*)    HCT 31.6 (*)    MCV 108.2 (*)    [name] 34.6 (*)    RDW 18.5 (*)    Neutro Abs 7.8 (*)    Lymphs Abs 0.5 (*)    All other components within normal limits  BASIC METABOLIC PANEL - Abnormal; Notable for the following:    BUN 41 (*)    Creatinine, Ser 2.21 (*)    Calcium 7.9 (*)    GFR calc non Af Amer 27 (*)    GFR calc Af Amer 31 (*)    All other components within normal limits  INFLUENZA PANEL BY PCR (TYPE A & B, H1N1) - Abnormal; Notable for the following:    Influenza A By PCR POSITIVE (*)    All other components within normal limits  CBC WITH DIFFERENTIAL/PLATELET - Abnormal; Notable for the following:    RBC 2.68 (*)    Hemoglobin 9.2 (*)    HCT 29.4 (*)    MCV 109.7 (*)    [name] 34.3 (*)    RDW 18.5 (*)    Platelets 125 (*)      Lymphs Abs 0.2 (*)    All other components within normal limits  COMPREHENSIVE METABOLIC PANEL - Abnormal; Notable for the following:    Chloride 113 (*)    BUN 34 (*)    Creatinine, Ser 1.85 (*)    Calcium 7.2 (*)    Total Protein 4.7 (*)    Albumin 2.7 (*)    ALT 13 (*)    GFR calc non Af Amer 33 (*)    GFR calc Af Amer 39 (*)    All other components within normal limits  PROTIME-INR - Abnormal; Notable for the following:    Prothrombin Time 20.3 (*)    All other components within normal limits  HEMOGLOBIN AND HEMATOCRIT, BLOOD - Abnormal; Notable for the following:    Hemoglobin 9.0 (*)    HCT 29.1 (*)    All other components within normal limits  CBC WITH DIFFERENTIAL/PLATELET - Abnormal; Notable for the following:    WBC 3.3 (*)    RBC 2.64 (*)    Hemoglobin 9.0 (*)    HCT 29.1 (*)    MCV 110.2 (*)    [name] 34.1 (*)    RDW 18.0 (*)    Platelets 110 (*)    Lymphs Abs 0.6 (*)    All other components within normal limits  BASIC METABOLIC PANEL - Abnormal; Notable for the following:    Chloride 115 (*)    BUN 29 (*)    Creatinine, Ser 1.82 (*)    Calcium 7.4 (*)    GFR calc non Af Amer 34 (*)  GFR calc Af Amer 39 (*)    All other components within normal limits  CBC - Abnormal; Notable for the following:    WBC 3.5 (*)    RBC 2.51 (*)    Hemoglobin 8.5 (*)    HCT 26.8 (*)    MCV 106.8 (*)    RDW 17.8 (*)    Platelets 123 (*)    All other components within normal limits  RAPID STREP SCREEN (NOT AT [facility])  CULTURE, GROUP A STREP ([place])  LACTIC ACID, PLASMA  LACTIC ACID, PLASMA  MAGNESIUM  PHOSPHORUS    EKG  EKG Interpretation None       Radiology No results found.   Dg Chest 2 View  Result Date: 08/21/2016 CLINICAL DATA:  Productive cough. Shortness of breath. Weakness. Fever for the past 2 days. EXAM: CHEST  2 VIEW COMPARISON:  Chest radiographs and CT dated 07/30/2016. FINDINGS: Normal sized heart. Aortic arch calcifications. Moderate-sized  hiatal hernia. Stable coarse interstitial prominence throughout both lungs. Cholecystectomy clips. Diffuse osteopenia. IMPRESSION: 1. No acute abnormality. 2. Stable interstitial fibrosis. 3. Stable moderate-sized hiatal hernia. 4. Aortic atherosclerosis. Electronically Signed   By: Claudie Revering M.D.   On: 08/21/2016 15:39   Procedures Procedures (including critical care time)  Medications Ordered in ED Medications  sodium chloride 0.9 % bolus 1,000 mL (0 mLs Intravenous Stopped 08/21/16 2144)  guaiFENesin-codeine 100-10 MG/5ML solution 5 mL (5 mLs Oral Given 08/21/16 2025)  acetaminophen (TYLENOL) tablet 650 mg (650 mg Oral Given 08/21/16 2025)  sodium chloride 0.9 % bolus 1,000 mL (0 mLs Intravenous Stopped 08/21/16 2333)  sodium chloride 0.9 % bolus 1,000 mL (1,000 mLs Intravenous Given 08/21/16 2353)  magnesium sulfate IVPB 2 g 50 mL (2 g Intravenous Given 08/22/16 0421)  epoetin alfa (EPOGEN,PROCRIT) injection 30,000 Units (30,000 Units Subcutaneous Given 08/23/16 1206)     Initial Impression / Assessment and Plan / ED Course  I have reviewed the triage vital signs and the nursing notes.  Pertinent labs & imaging results that were available during my care of the patient were reviewed by me and considered in my medical decision making (see chart for details).     Final Clinical Impressions(s) / ED Diagnoses   Final diagnoses:  Influenza   81 year old male with flulike symptoms. Initial plan was for discharge but he developd some mild hypoxemia and soft blood pressures. Flu subsequently did come back positive. He is at high risk given his age. We'll admit him for observation and start him on Tamiflu.  New Prescriptions Discharge Medication List as of 08/23/2016  4:07 PM    START taking these medications   Details  guaiFENesin-codeine 100-10 MG/5ML syrup Take 10 mLs by mouth every 6 (six) hours as needed for cough., Starting Tue 08/23/2016, Print    oseltamivir (TAMIFLU) 30 MG capsule  Take 1 capsule (30 mg total) by mouth at bedtime., Starting Tue 08/23/2016, Until Sat 08/27/2016, Normal         Virgel Manifold, MD 08/29/16 1431

## 2016-08-30 ENCOUNTER — Encounter (HOSPITAL_COMMUNITY)
Admission: RE | Admit: 2016-08-30 | Discharge: 2016-08-30 | Disposition: A | Discharge status: Home / Self Care | Payer: Medicare Other | Source: Ambulatory Visit | Attending: Nephrology | Admitting: Nephrology

## 2016-08-30 DIAGNOSIS — N183 Chronic kidney disease, stage 3 (moderate): Secondary | ICD-10-CM | POA: Diagnosis not present

## 2016-08-30 DIAGNOSIS — Z5181 Encounter for therapeutic drug level monitoring: Secondary | ICD-10-CM | POA: Diagnosis not present

## 2016-08-30 DIAGNOSIS — N184 Chronic kidney disease, stage 4 (severe): Secondary | ICD-10-CM

## 2016-08-30 DIAGNOSIS — Z79899 Other long term (current) drug therapy: Secondary | ICD-10-CM | POA: Diagnosis not present

## 2016-08-30 DIAGNOSIS — D631 Anemia in chronic kidney disease: Secondary | ICD-10-CM | POA: Diagnosis not present

## 2016-08-30 LAB — IRON AND TIBC
IRON: 27 ug/dL — AB (ref 45–182)
SATURATION RATIOS: 13 % — AB (ref 17.9–39.5)
TIBC: 209 ug/dL — ABNORMAL LOW (ref 250–450)
UIBC: 182 ug/dL

## 2016-08-30 LAB — POCT HEMOGLOBIN-HEMACUE: Hemoglobin: 10 g/dL — ABNORMAL LOW (ref 13.0–17.0)

## 2016-08-30 LAB — FERRITIN: Ferritin: 373 ng/mL — ABNORMAL HIGH (ref 24–336)

## 2016-08-30 MED ORDER — EPOETIN ALFA 40000 UNIT/ML IJ SOLN: 30000.0000 [IU] | INTRAMUSCULAR | Status: DC

## 2016-08-30 MED ORDER — EPOETIN ALFA 10000 UNIT/ML IJ SOLN
INTRAMUSCULAR | Status: AC
  Administered 2016-08-30: 14:00:00 10000 [IU] via SUBCUTANEOUS
  Filled 2016-08-30: qty 1

## 2016-08-30 MED ORDER — EPOETIN ALFA 20000 UNIT/ML IJ SOLN
INTRAMUSCULAR | Status: AC
  Administered 2016-08-30: 20000 [IU] via SUBCUTANEOUS
  Filled 2016-08-30: qty 1

## 2016-09-05 ENCOUNTER — Ambulatory Visit (INDEPENDENT_AMBULATORY_CARE_PROVIDER_SITE_OTHER): Payer: Medicare Other | Admitting: Physician Assistant

## 2016-09-05 ENCOUNTER — Encounter: Payer: Self-pay | Admitting: Physician Assistant

## 2016-09-05 ENCOUNTER — Other Ambulatory Visit (HOSPITAL_COMMUNITY): Payer: Self-pay | Admitting: *Deleted

## 2016-09-05 VITALS — BP 108/64 | HR 68 | Ht 68.0 in | Wt 118.0 lb

## 2016-09-05 DIAGNOSIS — K921 Melena: Secondary | ICD-10-CM | POA: Diagnosis not present

## 2016-09-05 DIAGNOSIS — D509 Iron deficiency anemia, unspecified: Secondary | ICD-10-CM

## 2016-09-05 DIAGNOSIS — K5520 Angiodysplasia of colon without hemorrhage: Secondary | ICD-10-CM

## 2016-09-05 DIAGNOSIS — Q2733 Arteriovenous malformation of digestive system vessel: Secondary | ICD-10-CM | POA: Diagnosis not present

## 2016-09-05 NOTE — Patient Instructions (Signed)
If you are age 79 or older, your body mass index should be between 23-30. Your Body mass index is 17.94 kg/m. If this is out of the aforementioned range listed, please consider follow up with your Primary Care Provider.  If you are age 78 or younger, your body mass index should be between 19-25. Your Body mass index is 17.94 kg/m. If this is out of the aformentioned range listed, please consider follow up with your Primary Care Provider.   You may restart your Zantac 150mg  twice daily and discontinue the Protonix.  Please restart your baby Aspirin daily (81mg ).  Please follow up with Amy or Dr Fuller Plan as needed.  Thank you.

## 2016-09-05 NOTE — Progress Notes (Signed)
Subjective:    Patient ID: Richard [name], male    DOB: [birth year], 81 y.o.   MRN: [id number]  HPI Jag is a very nice 81 year old white male known to Dr. Fuller Plan. He has history of Wegener's granulomatosis, chronic kidney disease, aortic stenosis, chronic respiratory failure, severe four-vessel coronary artery disease, ischemic cardiomyopathy with EF of 40%, history of SVT, hypertension and AVMs. Patient is seen today in follow-up after hospitalization with GI bleeding in December 2017. He was admitted 1223 through 08/02/2016 with severe anemia with hemoglobin in the 6 range and melena. He was seen by Dr. Benson Norway who was covering for GI and underwent EGD on 07/31/2016 which showed a benign stricture and a 5 cm hiatal hernia, otherwise negative exam. Colonoscopy was done on 08/01/2016 with finding of 2 sessile polyps in the hepatic flexure and cecum and a 20 mm polyp at the hepatic flexure which was removed piecemeal completely. Polyps were all tubular adenomas. He was also noted to have a large AVM in the cecum which was cauterized. Patient was resumed on Plavix but asked to hold his aspirin until GI follow-up. He has not had any further melena area and he has no complaints of abdominal discomfort and energy level has been fairly good. Hemoglobin on 08/23/2016 was 9 hematocrit of 29 area and He and his wife state that he had a follow-up done last week through nephrology and hemoglobin was 10. Patient was started on Protonix when he was in the hospital but says that Zantac works better for him and would like to switch back to Zantac. He also relates that Dr. Jimmy Footman  has him set up for an iron infusion next week when he goes for his usual weekly injection of what sounds like Epogen.    Review of Systems Pertinent positive and negative review of systems were noted in the above HPI section.  All other review of systems was otherwise negative.  Outpatient Encounter Prescriptions as of 09/05/2016    Medication Sig  . Acetaminophen (TYLENOL) 325 MG CAPS Take 325 mg by mouth every 6 (six) hours as needed (pain). TAKE PER BOX AS NEEDED FOR PAIN   . albuterol (VENTOLIN HFA) 108 (90 Base) MCG/ACT inhaler Inhale 1-2 puffs into the lungs every 4 (four) hours as needed for wheezing or shortness of breath.  Marland Kitchen b complex vitamins tablet Take 1 tablet by mouth daily.    . bisacodyl (DULCOLAX) 5 MG EC tablet Take 5 mg by mouth 2 (two) times daily.   . calcitRIOL (ROCALTROL) 0.25 MCG capsule Take 0.25 mcg by mouth daily.   . cyclophosphamide (CYTOXAN) 25 MG capsule Take 25 mg by mouth daily. Take one capsule by mouth daily on an empty stomach 1 hour before or 2 hours after meal.  . dextromethorphan-guaiFENesin (MUCINEX DM) 30-600 MG 12hr tablet Take 1 tablet by mouth 2 (two) times daily.  . finasteride (PROSCAR) 5 MG tablet TAKE ONE TABLET BY MOUTH ONCE DAILY  . guaiFENesin-codeine 100-10 MG/5ML syrup Take 10 mLs by mouth every 6 (six) hours as needed for cough.  Marland Kitchen ipratropium-albuterol (DUONEB) 0.5-2.5 (3) MG/3ML SOLN Take 3 mLs by nebulization every 4 (four) hours as needed (if inhaler is not effective in resolving shortness of breath or wheezing). (Patient taking differently: Take 3 mLs by nebulization every 4 (four) hours as needed (WITH FLUTTER VALVE FOR WHEEZING, SHORTNESS OF BREATH-PLAN C). )  . loratadine (CLARITIN) 10 MG tablet Take 10 mg by mouth daily.   . Methylcellulose, Laxative, (CITRUCEL  PO) Take 1 packet by mouth daily.  . metoprolol succinate (TOPROL-XL) 25 MG 24 hr tablet Take 1 tablet (25 mg total) by mouth daily.  . nitroGLYCERIN (NITROSTAT) 0.4 MG SL tablet Place 1 tablet (0.4 mg total) under the tongue every 5 (five) minutes x 3 doses as needed for chest pain.  . pantoprazole (PROTONIX) 40 MG tablet Take 1 tablet (40 mg total) by mouth daily.  . predniSONE (DELTASONE) 5 MG tablet Take 7.5 mg by mouth daily with breakfast.  . simethicone (MYLICON) 0000000 MG chewable tablet Chew 125 mg by  mouth every 6 (six) hours as needed for flatulence. TAKE PER BOX FOR GAS   . simvastatin (ZOCOR) 20 MG tablet TAKE ONE TABLET BY MOUTH ONCE DAILY AT  6  PM  . sodium bicarbonate 650 MG tablet Take 2 tablets (1,300 mg total) by mouth 2 (two) times daily. NEED OV.  . sodium chloride (OCEAN) 0.65 % SOLN nasal spray Place 1 spray into both nostrils as needed for congestion.  . SYMBICORT 160-4.5 MCG/ACT inhaler INHALE TWO PUFFS BY MOUTH TWICE DAILY  . tamsulosin (FLOMAX) 0.4 MG CAPS capsule TAKE ONE CAPSULE BY MOUTH ONCE DAILY IN THE EVENING  . [DISCONTINUED] aspirin EC 81 MG tablet Take 1 tablet (81 mg total) by mouth daily.  . [DISCONTINUED] clopidogrel (PLAVIX) 75 MG tablet Take 1 tablet (75 mg total) by mouth daily. TAKE ONE TABLET BY MOUTH ONCE DAILY  . ranitidine (ZANTAC) 300 MG tablet Take 300 mg by mouth 2 (two) times daily.   No facility-administered encounter medications on file as of 09/05/2016.    No Known Allergies Patient Active Problem List   Diagnosis Date Noted  . Malnutrition of moderate degree 08/22/2016  . Influenza A 08/21/2016  . AVM (arteriovenous malformation) of colon with hemorrhage   . Symptomatic anemia 07/30/2016  . Gastrointestinal hemorrhage with melena 07/30/2016  . Aortic valve stenosis 07/08/2016  . Protein-calorie malnutrition, severe ([place]) 04/21/2015  . CAP (community acquired pneumonia) 04/18/2015  . Chronic kidney disease, stage IV (severe) ([place]) 03/26/2015  . Wegener's granulomatosis with renal involvement ([place]) 07/15/2014  . Pseudoaneurysm of right femoral artery ([place]) 04/08/2014  . CAD- severe 4 V CAD- not CABG candidate 03/17/2014  . Anemia 03/17/2014  . Cardiomyopathy, ischemic- EF 40% by [facility] 03/17/2014  . Unstable Class III Angina 03/13/2014  . Abnormal nuclear stress test: Intermediate risk with moderate region of apical and inferior scar with mild superimposed ischemia; EF 40% 03/12/2014  . Loss of weight 12/12/2013  . Abdominal pain,  unspecified site 12/12/2013  . SVT (supraventricular tachycardia) ([place]) 10/04/2013  . SOB (shortness of breath) 10/04/2013  . Weakness generalized 10/04/2013  . Hypertension 09/28/2013  . Chronic rhinitis 09/27/2013  . Chronic respiratory failure ([place]) 12/26/2012  . Bronchiectasis with acute exacerbation ([place]) 09/28/2011  . OTHER DYSPHAGIA 10/25/2010  . Wegener's granulomatosis ([place]) 05/10/2010  . GERD 03/10/2010  . ARTHRITIS, CERVICAL SPINE 03/10/2010  . Hypothyroidism 11/13/2008  . VITAMIN D DEFICIENCY 11/13/2008  . Hyperlipidemia 11/13/2008  . ANEMIA 11/13/2008  . BPH (benign prostatic hyperplasia) 12/04/2007  . Obstructive bronchiectasis ([place]) 01/25/2007   Social History   Social History  . Marital status: Married    Spouse name: N/A  . Number of children: N/A  . Years of education: N/A   Occupational History  . Retired    Social History Main Topics  . Smoking status: Never Smoker  . Smokeless tobacco: Never Used  . Alcohol use No  . Drug use: No  .  Sexual activity: Yes   Other Topics Concern  . Not on file   Social History Narrative  . No narrative on file    Mr. Marzette's family history includes Asthma in his father; Coronary artery disease in his brother, brother, and mother.      Objective:    Vitals:   09/05/16 1403  BP: 108/64  Pulse: 68    Physical Exam  well-developed elderly white male in no acute distress, accompanied by his wife. Both very pleasant blood pressure 108/64 pulse 68, BMI 17.9. HEENT; nontraumatic normocephalic EOMI PERRLA sclera anicteric, Cardiovascular ;regular rate and rhythm with S1-S2 he has a harsh systolic murmur, Pulmonary; clear bilaterally, Abdomen ;soft, nontender nondistended bowel sounds are active there is no palpable mass or hepatosplenomegaly, Rectal exam not done, Extremities; no clubbing cyanosis or edema ,skin warm and dry- he does have extensive bruising of left hand, Neuropsych; mood and affect  appropriate       Assessment & Plan:   #73 81 year old white male seen in post hospital follow-up today after admission December 2017 for melena and severe anemia. Patient has history of chronic anemia secondary to chronic kidney disease. Melena found secondary to large cecal AVM which was cauterized to destruction. Episode of GI bleeding occurred in setting of Plavix and aspirin. No further bleeding over the past month. #2 Wegener's granulomatosis #3 ischemic cardiomyopathy EF 40% #5 severe four-vessel coronary artery disease #6 aortic stenosis or 7 chronic kidney disease  Plan; reviewed endoscopic evaluation and findings with the patient and his wife. He had iron studies done during his hospitalization which did show him to be iron deficient and this  was explained to the patient as well. Advised to go ahead with iron infusions He needs serial CBCs done and states that his labs are checked a couple of times per month per Dr. Jimmy Footman  They are advised to notify us for any significant drop in hemoglobin or for recurrent melena He may resume baby aspirin 81 mg daily Stop Protonix and resume Zantac 150 twice a day He'll follow-up with Dr. Fuller Plan or myself on an as-needed basis.  Aleesa Sweigert Genia Harold PA-C 09/05/2016   Cc: [name], MD

## 2016-09-06 ENCOUNTER — Encounter (HOSPITAL_COMMUNITY)
Admission: RE | Admit: 2016-09-06 | Discharge: 2016-09-06 | Disposition: A | Discharge status: Home / Self Care | Payer: Medicare Other | Source: Ambulatory Visit | Attending: Nephrology | Admitting: Nephrology

## 2016-09-06 DIAGNOSIS — N184 Chronic kidney disease, stage 4 (severe): Secondary | ICD-10-CM

## 2016-09-06 DIAGNOSIS — N183 Chronic kidney disease, stage 3 (moderate): Secondary | ICD-10-CM | POA: Diagnosis not present

## 2016-09-06 MED ORDER — EPOETIN ALFA 40000 UNIT/ML IJ SOLN: 30000.0000 [IU] | INTRAMUSCULAR | Status: DC

## 2016-09-06 MED ORDER — EPOETIN ALFA 20000 UNIT/ML IJ SOLN
INTRAMUSCULAR | Status: AC
  Administered 2016-09-06: 13:00:00 20000 [IU]
  Filled 2016-09-06: qty 1

## 2016-09-06 MED ORDER — EPOETIN ALFA 10000 UNIT/ML IJ SOLN
INTRAMUSCULAR | Status: AC
  Administered 2016-09-06: 13:00:00 10000 [IU]
  Filled 2016-09-06: qty 1

## 2016-09-06 MED ORDER — SODIUM CHLORIDE 0.9 % IV SOLN
510.0000 mg | INTRAVENOUS | Status: DC
  Administered 2016-09-06: 13:00:00 510 mg via INTRAVENOUS
  Filled 2016-09-06: qty 17

## 2016-09-06 NOTE — Progress Notes (Signed)
Reviewed and agree with management plan.  Khamari Yousuf T. Perfecto Purdy, MD FACG 

## 2016-09-07 LAB — POCT HEMOGLOBIN-HEMACUE: HEMOGLOBIN: 10.9 g/dL — AB (ref 13.0–17.0)

## 2016-09-09 LAB — AFB CULTURE WITH SMEAR (NOT AT ARMC)

## 2016-09-13 ENCOUNTER — Ambulatory Visit (HOSPITAL_COMMUNITY)
Admission: RE | Admit: 2016-09-13 | Discharge: 2016-09-13 | Disposition: A | Discharge status: Home / Self Care | Payer: Medicare Other | Source: Ambulatory Visit | Attending: Nephrology | Admitting: Nephrology

## 2016-09-13 DIAGNOSIS — D631 Anemia in chronic kidney disease: Secondary | ICD-10-CM | POA: Diagnosis not present

## 2016-09-13 DIAGNOSIS — N184 Chronic kidney disease, stage 4 (severe): Secondary | ICD-10-CM | POA: Diagnosis not present

## 2016-09-13 LAB — POCT HEMOGLOBIN-HEMACUE: HEMOGLOBIN: 10.9 g/dL — AB (ref 13.0–17.0)

## 2016-09-13 MED ORDER — EPOETIN ALFA 20000 UNIT/ML IJ SOLN
INTRAMUSCULAR | Status: AC
  Administered 2016-09-13: 13:00:00 20000 [IU] via SUBCUTANEOUS
  Filled 2016-09-13: qty 1

## 2016-09-13 MED ORDER — EPOETIN ALFA 40000 UNIT/ML IJ SOLN: 30000.0000 [IU] | INTRAMUSCULAR | Status: DC

## 2016-09-13 MED ORDER — SODIUM CHLORIDE 0.9 % IV SOLN
510.0000 mg | INTRAVENOUS | Status: AC
  Administered 2016-09-13: 510 mg via INTRAVENOUS
  Filled 2016-09-13: qty 17

## 2016-09-13 MED ORDER — EPOETIN ALFA 10000 UNIT/ML IJ SOLN
INTRAMUSCULAR | Status: AC
  Administered 2016-09-13: 10000 [IU] via SUBCUTANEOUS
  Filled 2016-09-13: qty 1

## 2016-09-15 ENCOUNTER — Other Ambulatory Visit (INDEPENDENT_AMBULATORY_CARE_PROVIDER_SITE_OTHER): Payer: Medicare Other

## 2016-09-15 ENCOUNTER — Ambulatory Visit (INDEPENDENT_AMBULATORY_CARE_PROVIDER_SITE_OTHER): Payer: Medicare Other | Admitting: Internal Medicine

## 2016-09-15 ENCOUNTER — Ambulatory Visit (INDEPENDENT_AMBULATORY_CARE_PROVIDER_SITE_OTHER)
Admission: RE | Admit: 2016-09-15 | Discharge: 2016-09-15 | Disposition: A | Discharge status: Home / Self Care | Payer: Medicare Other | Source: Ambulatory Visit | Attending: Internal Medicine | Admitting: Internal Medicine

## 2016-09-15 ENCOUNTER — Encounter: Payer: Self-pay | Admitting: Internal Medicine

## 2016-09-15 VITALS — BP 102/70 | HR 110 | Ht 69.0 in | Wt 114.8 lb

## 2016-09-15 DIAGNOSIS — J479 Bronchiectasis, uncomplicated: Secondary | ICD-10-CM

## 2016-09-15 DIAGNOSIS — J471 Bronchiectasis with (acute) exacerbation: Secondary | ICD-10-CM

## 2016-09-15 LAB — CBC WITH DIFFERENTIAL/PLATELET
BASOS ABS: 0 10*3/uL (ref 0.0–0.1)
Basophils Relative: 0.3 % (ref 0.0–3.0)
EOS ABS: 0 10*3/uL (ref 0.0–0.7)
Eosinophils Relative: 0.2 % (ref 0.0–5.0)
HCT: 36.2 % — ABNORMAL LOW (ref 39.0–52.0)
Hemoglobin: 12 g/dL — ABNORMAL LOW (ref 13.0–17.0)
LYMPHS ABS: 0.3 10*3/uL — AB (ref 0.7–4.0)
LYMPHS PCT: 4 % — AB (ref 12.0–46.0)
MCHC: 33.1 g/dL (ref 30.0–36.0)
MCV: 105.7 fl — AB (ref 78.0–100.0)
MONOS PCT: 10.1 % (ref 3.0–12.0)
Monocytes Absolute: 0.9 10*3/uL (ref 0.1–1.0)
NEUTROS PCT: 85.4 % — AB (ref 43.0–77.0)
Neutro Abs: 7.4 10*3/uL (ref 1.4–7.7)
Platelets: 187 10*3/uL (ref 150.0–400.0)
RBC: 3.43 Mil/uL — AB (ref 4.22–5.81)
RDW: 18.9 % — ABNORMAL HIGH (ref 11.5–15.5)
WBC: 8.6 10*3/uL (ref 4.0–10.5)

## 2016-09-15 LAB — BASIC METABOLIC PANEL
BUN: 34 mg/dL — ABNORMAL HIGH (ref 6–23)
CALCIUM: 9.1 mg/dL (ref 8.4–10.5)
CO2: 27 mEq/L (ref 19–32)
Chloride: 105 mEq/L (ref 96–112)
Creatinine, Ser: 2.28 mg/dL — ABNORMAL HIGH (ref 0.40–1.50)
GFR: 29.66 mL/min — AB (ref >60.00)
GLUCOSE: 119 mg/dL — AB (ref 70–99)
Potassium: 4.6 mEq/L (ref 3.5–5.1)
SODIUM: 140 meq/L (ref 135–145)

## 2016-09-15 LAB — SEDIMENTATION RATE: Sed Rate: 34 mm/hr — ABNORMAL HIGH (ref 0–20)

## 2016-09-15 MED ORDER — PREDNISONE 10 MG PO TABS: ORAL_TABLET | ORAL | 0 refills | Status: DC

## 2016-09-15 MED ORDER — TRAMADOL HCL 50 MG PO TABS: ORAL_TABLET | ORAL | 0 refills | Status: DC

## 2016-09-15 NOTE — Patient Instructions (Addendum)
For cough as per calendar mucinex dm up to 1200 mg every 12 hours and the flutter valve and add at Tramadol 50 mg up to 1 every 4 hours as needed  Please remember to go to the lab and x-ray department downstairs in the basement  for your tests - we will call you with the results when they are available.    Prednisone 10 mg take  4 each am x 2 days,   2 each am x 2 days,  1 each am x 2 days and continue prednisone 7.5 mg daily  See Tammy NP w/in 2 weeks with all your medications, even over the counter meds, separated in two separate bags, the ones you take no matter what vs the ones you stop once you feel better and take only as needed when you feel you need them.   Tammy  will generate for you a new user friendly medication calendar that will put [name] all on the same page re: your medication use.     Without this process, it simply isn't possible to assure that we are providing  your outpatient care  with  the attention to detail we feel you deserve.   If we cannot assure that you're getting that kind of care,  then we cannot manage your problem effectively from this clinic.  Once you have seen Tammy and we are sure that we're all on the same page with your medication use she will arrange follow up with me.

## 2016-09-15 NOTE — Progress Notes (Signed)
Subjective:    Patient ID: Richard [name], male    DOB: 1935-12-31     MRN: [id number]   Brief patient profile:  77  yowm never smoker with dx of Longstanding bronchiectasis then dx with WG 03/2010 > requested establish with Richard [name].   Admit [place] dx WG by pos c anca 03/2010, supportive tbbx but not vats   Admit [name] dx spont L Ptx 9/08/15/09 requiring Chest tube but no surgery   11/23/2012 f/u ov/Richard [name] re WG/ bronchiectasis Chief Complaint  Patient presents with  . Follow-up    Had PFT today-sob same,cough-yellow,wheezing occass.,  no hemoptysis, no real limiting sob though sedentary, no arthralgias or rash maintaining 2.5 mg daily No change prednisone 5 mg one half daily  > ok to try every other day if doing great May 1 flare rx levaquin and pred May 16 lap chole [facility]   08/19/2013 f/u ov/Richard [name] re: ? Recurrent WG Chief Complaint  Patient presents with  . Follow-up    Breathing is unchanged. Cough unchanged-prod cpugh w/ yellow phlem. On 2nd dose of cipro  now on day 5/10 cipro and less dark, never bloody, no sinus complaints, arthritis rash or fever or hematuria. >ANCA 1:80 titer, started on cytoxan          02/01/2016  f/u ov/Richard [name] See re: obst bronchiectasis maint rx = symbicort 160 2 bid and duoneb bid Chief Complaint  Patient presents with  . Follow-up    PFT done today.  Pt went to ED in Bronchiectasis flare 01/07/16- was txed with Augmentin. He is using duoneb 2 x daily. His cough is prod with yellow sputum.  back to baseline doe = MMRC=1  but confused with meds again , esp prns rec Plan A = Automatic = Symbicort 160 Take 2 puffs first thing in am and then another 2 puffs about 12 hours later.  Plan B = Backup Only use your albuterol (Red =Proair/ yellow = Proventil) as a rescue medication  Plan C = Crisis - only use your albuterol/ipatropium(duoneb) nebulizer if you first try Plan B    07/14/2016 Follow up : Bronchiectasis /WG  Pt returns for 1 month follow up . Had a  bronchiectatic flare last ov , tx w/ 10 d of augmentin. Says he is feeling better. Still has cough and congestion although less .  Mucus varies with white to yellow at time. Sometimes hard to get up . rec Continue on current regimen  Check sputum culture > done 12/151/7 . > pos pseudomonas sensitive to CIPRO> Follow med calendar closely and bring to each visit.     09/15/2016  f/u ov/Richard [name] re: bronchiectasis/ WG pred still at 7.5 mg daily/ ctx per renal  Chief Complaint  Patient presents with  . Follow-up    x's with the flu back in Jan, after illness he feels congested, lack of energy, SOB, cough is not better, loss of appetite    Sputum is not purulent / complaints are all non-specific and really no change in baseline doe  No obvious day to day or daytime variability or assoc  mucus plugs or hemoptysis or cp or chest tightness, subjective wheeze or overt sinus or hb symptoms. No unusual exp hx or h/o childhood pna/ asthma or knowledge of premature birth.  Sleeping ok without nocturnal  or early am exacerbation  of respiratory  c/o's or need for noct saba. Also denies any obvious fluctuation of symptoms with weather or environmental changes or other aggravating or alleviating factors except  as outlined above   Current Medications, Allergies, Complete Past Medical History, Past Surgical History, Family History, and Social History were reviewed in [employer] record.  ROS  The following are not active complaints unless bolded sore throat, dysphagia, dental problems, itching, sneezing,  nasal congestion or excess/ purulent secretions, ear ache,   fever, chills, sweats, unintended wt loss, classically pleuritic or exertional cp,  orthopnea pnd or leg swelling, presyncope, palpitations, abdominal pain, anorexia, nausea, vomiting, diarrhea  or change in bowel or bladder habits, change in stools or urine, dysuria,hematuria,  rash, arthralgias, visual complaints, headache,  numbness, weakness or ataxia or problems with walking or coordination,  change in mood/affect or memory.                            Objective:   Physical Exam  wt   128  04/26/11 >07/08/2011  134> 134 11/28/2011 > 02/29/2012 136> 05/31/2012  136>  134 11/22/12 > 07/26/2013  136 > 08/21/2013  136 >133 09/06/2013 >  09/12/2013  133 > 132 09/27/2013 > 10/15/2013 131 > 11/07/2013  128 > 01/09/14 124 > 02/12/2014  123 > 04/11/2014   117 > 05/23/2014 119 > 08/19/2014 110 > 10/30/2014  120 >  04/09/2015 120 > 07/08/2015  119 > 10/13/2015  114 > 02/01/2016   117 > 02/29/2016   118 >  06/09/2016  125 >  09/15/2016  114       amb thin pleasant but chronically ill appearing  wm nad  Vital signs reviewed  - Note on arrival 02 sats  97% on RA   SKIN: no rash, lesions  NODES: no lymphadenopathy  HEENT: [place]/AT, EOM- WNL, Conjuctivae- clear, PERRLA, TM-WNL, Nose- clear, Throat- clear and wnl, Mallampati III  NECK: Supple w/ fair ROM, JVD- none, normal carotid impulses w/o bruits   CHEST: insp and exp rhonchi diffusely bilaterally  HEART: RRR, no m/g/r heard  ABDOMEN: Soft and nl EXT:  No deformities or restrictions   CXR PA and Lateral:   09/15/2016 :    I personally reviewed images and agree with radiology impression as follows:   Chronic lung changes without evidence of acute abnormality.   Labs ordered/ reviewed:      Chemistry      Component Value Date/Time   NA 140 09/15/2016 1536   K 4.6 09/15/2016 1536   CL 105 09/15/2016 1536   CO2 27 09/15/2016 1536   BUN 34 (H) 09/15/2016 1536   CREATININE 2.28 (H) 09/15/2016 1536   CREATININE 1.92 (H) 03/24/2015 0919      Component Value Date/Time   CALCIUM 9.1 09/15/2016 1536   CALCIUM 8.7 06/09/2015 1342   ALKPHOS 54 08/22/2016 0549   AST 20 08/22/2016 0549   ALT 13 (L) 08/22/2016 0549   BILITOT 0.8 08/22/2016 0549        Lab Results  Component Value Date   WBC 8.6 09/15/2016   HGB 12.0 (L) 09/15/2016   HCT 36.2 (L) 09/15/2016   MCV 105.7 (H) 09/15/2016    PLT 187.0 09/15/2016      Lab Results  Component Value Date   TSH 4.228 03/24/2015       Lab Results  Component Value Date   ESRSEDRATE 34 (H) 09/15/2016   ESRSEDRATE 8 02/29/2016   ESRSEDRATE 48 (H) 02/12/2014

## 2016-09-18 NOTE — Assessment & Plan Note (Addendum)
-     PFT's 06/21/11   FEV1  1.60 (61%) ratio 48 and 10% better p B2,  DLCO 83%  - CT 10/04/13 severe multifocal bronchiectasis as well as numerous cavitary and      non-cavitary nodules  -   PFT's 11/22/2012 FEV1  1.52 (62%) arnd 46 and no change p B2 DLCO 89%  -    VEST rec 02/12/2014 > reported improvement 05/23/14  - 07/08/2015  extensive coaching HFA effectiveness =  90%    - 02/01/2016  After extensive coaching HFA effectiveness =    90%  - PFT's  02/01/2016  FEV1 1.34 (51 % ) ratio 53  p 4 % improvement from saba p symbicort 160 prior to study with DLCO  44/46 % corrects to 85 % for alv volume    - changed to prn augmentin x 10 days 06/09/2016 >>>  Med calendar 07/14/16  Sputum cx 07/2016 w/++ psuedomonas > pan sensitive , Cipro 750mg  x 10 07/28/2016   09/15/2016  After extensive coaching HFA effectiveness =    90% from a baseline of 75%   Profound FTT ? Etiology with no evidence of ongoing infection at this point and poor airway control on extensive med list that I suspect he's not following, at least not the action plans at the bottom   Therefore rec: No further abx unless indicated by fever/ purulent sputum, new infiltrates Max rx for cough with mucinex dm/ flutter/ tramadol Return in 2 weeks with all meds in hand using a trust but verify approach to confirm accurate Medication  Reconciliation The principal here is that until we are certain that the  patients are doing what we've asked, it makes no sense to ask them to do more.   I had an extended discussion with the patient and wife  reviewing all relevant studies completed to date and  lasting 15 to 20 minutes of a 25 minute visit    Each maintenance medication was reviewed in detail including most importantly the difference between maintenance and prns and under what circumstances the prns are to be triggered using an action plan format that is not reflected in the computer generated alphabetically organized AVS.    Please see AVS for  specific instructions unique to this visit that I personally wrote and verbalized to the the pt in detail and then reviewed with pt  by my nurse highlighting any  changes in therapy recommended at today's visit to their plan of care.

## 2016-09-20 ENCOUNTER — Encounter (HOSPITAL_COMMUNITY)
Admission: RE | Admit: 2016-09-20 | Discharge: 2016-09-20 | Disposition: A | Discharge status: Home / Self Care | Payer: Medicare Other | Source: Ambulatory Visit | Attending: Nephrology | Admitting: Nephrology

## 2016-09-20 DIAGNOSIS — Z79899 Other long term (current) drug therapy: Secondary | ICD-10-CM | POA: Insufficient documentation

## 2016-09-20 DIAGNOSIS — Z5181 Encounter for therapeutic drug level monitoring: Secondary | ICD-10-CM | POA: Insufficient documentation

## 2016-09-20 DIAGNOSIS — N184 Chronic kidney disease, stage 4 (severe): Secondary | ICD-10-CM

## 2016-09-20 DIAGNOSIS — N183 Chronic kidney disease, stage 3 (moderate): Secondary | ICD-10-CM | POA: Diagnosis not present

## 2016-09-20 DIAGNOSIS — D631 Anemia in chronic kidney disease: Secondary | ICD-10-CM | POA: Insufficient documentation

## 2016-09-20 LAB — POCT HEMOGLOBIN-HEMACUE: Hemoglobin: 10.8 g/dL — ABNORMAL LOW (ref 13.0–17.0)

## 2016-09-20 MED ORDER — EPOETIN ALFA 10000 UNIT/ML IJ SOLN
INTRAMUSCULAR | Status: AC
Start: 2016-09-20 — End: 2016-09-20
  Administered 2016-09-20: 15:00:00 10000 [IU] via SUBCUTANEOUS
  Filled 2016-09-20: qty 1

## 2016-09-20 MED ORDER — EPOETIN ALFA 40000 UNIT/ML IJ SOLN: 30000.0000 [IU] | INTRAMUSCULAR | Status: DC

## 2016-09-20 MED ORDER — EPOETIN ALFA 20000 UNIT/ML IJ SOLN
INTRAMUSCULAR | Status: AC
  Administered 2016-09-20: 20000 [IU] via SUBCUTANEOUS
  Filled 2016-09-20: qty 1

## 2016-09-26 ENCOUNTER — Other Ambulatory Visit (HOSPITAL_COMMUNITY): Payer: Self-pay | Admitting: *Deleted

## 2016-09-27 ENCOUNTER — Encounter (HOSPITAL_COMMUNITY)
Admission: RE | Admit: 2016-09-27 | Discharge: 2016-09-27 | Disposition: A | Discharge status: Home / Self Care | Payer: Medicare Other | Source: Ambulatory Visit | Attending: Nephrology | Admitting: Nephrology

## 2016-09-27 DIAGNOSIS — N184 Chronic kidney disease, stage 4 (severe): Secondary | ICD-10-CM

## 2016-09-27 DIAGNOSIS — N183 Chronic kidney disease, stage 3 (moderate): Secondary | ICD-10-CM | POA: Diagnosis not present

## 2016-09-27 LAB — IRON AND TIBC
Iron: 51 ug/dL (ref 45–182)
Saturation Ratios: 28 % (ref 17.9–39.5)
TIBC: 183 ug/dL — AB (ref 250–450)
UIBC: 132 ug/dL

## 2016-09-27 LAB — POCT HEMOGLOBIN-HEMACUE: HEMOGLOBIN: 11.8 g/dL — AB (ref 13.0–17.0)

## 2016-09-27 LAB — FERRITIN: FERRITIN: 873 ng/mL — AB (ref 24–336)

## 2016-09-27 MED ORDER — EPOETIN ALFA 40000 UNIT/ML IJ SOLN: 30000.0000 [IU] | INTRAMUSCULAR | Status: DC

## 2016-09-27 MED ORDER — EPOETIN ALFA 10000 UNIT/ML IJ SOLN
INTRAMUSCULAR | Status: AC
  Administered 2016-09-27: 10000 [IU] via SUBCUTANEOUS
  Filled 2016-09-27: qty 1

## 2016-09-27 MED ORDER — EPOETIN ALFA 20000 UNIT/ML IJ SOLN
INTRAMUSCULAR | Status: AC
Start: 2016-09-27 — End: 2016-09-27
  Administered 2016-09-27: 20000 [IU] via SUBCUTANEOUS
  Filled 2016-09-27: qty 1

## 2016-10-03 ENCOUNTER — Other Ambulatory Visit (HOSPITAL_COMMUNITY): Payer: Self-pay | Admitting: *Deleted

## 2016-10-04 ENCOUNTER — Ambulatory Visit (HOSPITAL_COMMUNITY)
Admission: RE | Admit: 2016-10-04 | Discharge: 2016-10-04 | Disposition: A | Discharge status: Home / Self Care | Payer: Medicare Other | Source: Ambulatory Visit | Attending: Nephrology | Admitting: Nephrology

## 2016-10-04 DIAGNOSIS — N184 Chronic kidney disease, stage 4 (severe): Secondary | ICD-10-CM | POA: Diagnosis not present

## 2016-10-04 DIAGNOSIS — D631 Anemia in chronic kidney disease: Secondary | ICD-10-CM | POA: Insufficient documentation

## 2016-10-04 LAB — POCT HEMOGLOBIN-HEMACUE: Hemoglobin: 10.9 g/dL — ABNORMAL LOW (ref 13.0–17.0)

## 2016-10-04 MED ORDER — EPOETIN ALFA 20000 UNIT/ML IJ SOLN
INTRAMUSCULAR | Status: AC
  Administered 2016-10-04: 20000 [IU] via SUBCUTANEOUS
  Filled 2016-10-04: qty 1

## 2016-10-04 MED ORDER — EPOETIN ALFA 10000 UNIT/ML IJ SOLN
INTRAMUSCULAR | Status: AC
  Administered 2016-10-04: 13:00:00 10000 [IU] via SUBCUTANEOUS
  Filled 2016-10-04: qty 1

## 2016-10-04 MED ORDER — FERUMOXYTOL INJECTION 510 MG/17 ML
510.0000 mg | Freq: Once | INTRAVENOUS | Status: AC
  Administered 2016-10-04: 13:00:00 510 mg via INTRAVENOUS
  Filled 2016-10-04: qty 17

## 2016-10-04 MED ORDER — EPOETIN ALFA 40000 UNIT/ML IJ SOLN
30000.0000 [IU] | INTRAMUSCULAR | Status: DC
Start: 2016-10-04 — End: 2016-10-05

## 2016-10-06 ENCOUNTER — Ambulatory Visit (INDEPENDENT_AMBULATORY_CARE_PROVIDER_SITE_OTHER): Payer: Medicare Other | Admitting: Adult Health

## 2016-10-06 ENCOUNTER — Encounter: Payer: Self-pay | Admitting: Adult Health

## 2016-10-06 DIAGNOSIS — J471 Bronchiectasis with (acute) exacerbation: Secondary | ICD-10-CM

## 2016-10-06 DIAGNOSIS — J479 Bronchiectasis, uncomplicated: Secondary | ICD-10-CM

## 2016-10-06 MED ORDER — HYDROCODONE-HOMATROPINE 5-1.5 MG/5ML PO SYRP: 5.0000 mL | ORAL_SOLUTION | Freq: Four times a day (QID) | ORAL | 0 refills | Status: DC | PRN

## 2016-10-06 NOTE — Assessment & Plan Note (Signed)
Recent flare now resolving  Hold on additional abx at this time.  Patient's medications were reviewed today and patient education was given. Computerized medication calendar was adjusted/completed   Plan  Advised on flutter use , ,may try hydromet , advised to use As needed  , may be sedation.  Patient Instructions  Continue on current regimen with flutter valve  May stop Tramadol , may use Hydromet 1 tsp every 6 hr as needed. -may make you sleepy  Follow med calendar closely and bring to each visit.  Follow up Dr. [name]  In 3 months and As needed

## 2016-10-06 NOTE — Progress Notes (Signed)
@Patient  ID: Blossom Hoops, male    DOB: Nov 11, 1935, 81 y.o.   MRN: [id number]  Chief Complaint  Patient presents with  . Follow-up    Bronchiectasis     Referring provider: [name], MD  HPI: [id number]  yowm never smoker with dx of Longstanding bronchiectasis then dx with WG 03/2010 > requested establish with [name].   Admit [place] dx WG by pos c [facility]a 03/2010, supportive tbbx but not vats   Admit [name] dx spont L Ptx 9/08/15/09 requiring Chest tube but no surgery   10/06/2016 Follow up: Bronchiectasis /[facility] on chronic steroids -Prednisone 7.5mg  daily /Cytoxan per renal  Pt returns for 79 month follow up and med review  We reviewed all his meds and organized them into a med calendar with pt education.  Appears to be taking correctly.   Has had a slow to resolve bronchiectatic flare after influenza in Jan 2018.  Given tramadol last ov for cough. Did not think it worked very well.  Wants a cough syrup to help with cough .  Not using flutter valve on regular basis , discussed using this 3-4 times a day.    No Known Allergies  Immunization History  Administered Date(s) Administered  . Influenza Split 05/27/2011, 05/31/2012  . Influenza Whole 05/22/2008, 05/28/2009, 05/17/2010  . Influenza, High Dose Seasonal PF 04/21/2016  . Influenza,inj,Quad PF,36+ Mos 04/26/2013, 04/11/2014, 04/20/2015  . Pneumococcal Conjugate-13 10/15/2013  . Pneumococcal Polysaccharide-23 08/09/1999  . Td 01/13/2004  . Tdap 02/12/2014    Past Medical History:  Diagnosis Date  . Adenomatous colon polyp   . Anemia   . Asthma   . BPH (benign prostatic hyperplasia)    sees Dr. Risa Grill, biopsy June 2015 was benign   . Bronchiectasis ([place])   . CAD (coronary artery disease)    a. [place] s/p C[place] x3 and ultimately BMS to pLAD& POBA to CTO of circumflex/marginal vessel on 03/17/14 and staged BMS to [facility] on 03/21/14  . Cataract   . Diverticulosis   . Femoral artery pseudo-aneurysm, right ([place])    a. s/p repair. Follow  up with Dr. Kellie Simmering   . Fungal infection    lungs  . GERD (gastroesophageal reflux disease)   . Hiatal hernia   . HOH (hard of hearing)    bilaterally  . HTN (hypertension) 09/28/2013  . On home oxygen therapy    uses 2 l/m nasally at bedtime  . Peptic stricture of esophagus   . Wegener's granulomatosis ([place])    sees Dr. [name]     Tobacco History: History  Smoking Status  . Never Smoker  Smokeless Tobacco  . Never Used   Counseling given: Not Answered   Outpatient Encounter Prescriptions as of 10/06/2016  Medication Sig  . Acetaminophen (TYLENOL) 325 MG CAPS Take 325 mg by mouth every 6 (six) hours as needed (pain). TAKE PER BOX AS NEEDED FOR PAIN   . albuterol (VENTOLIN HFA) 108 (90 Base) MCG/ACT inhaler Inhale 1-2 puffs into the lungs every 4 (four) hours as needed for wheezing or shortness of breath.  Marland Kitchen amoxicillin (AMOXIL) 875 MG tablet Take 875 mg by mouth 2 (two) times daily.  Marland Kitchen b complex vitamins tablet Take 1 tablet by mouth daily.    . bisacodyl (DULCOLAX) 5 MG EC tablet Take 5 mg by mouth 2 (two) times daily.   . calcitRIOL (ROCALTROL) 0.25 MCG capsule Take 0.25 mcg by mouth daily.   . cyclophosphamide (CYTOXAN) 25 MG capsule Take 25 mg  by mouth daily. Take one capsule by mouth daily on an empty stomach 1 hour before or 2 hours after meal.  . dextromethorphan-guaiFENesin (MUCINEX DM) 30-600 MG 12hr tablet Take 1 tablet by mouth 2 (two) times daily.  . finasteride (PROSCAR) 5 MG tablet TAKE ONE TABLET BY MOUTH ONCE DAILY  . guaiFENesin-codeine 100-10 MG/5ML syrup Take 10 mLs by mouth every 6 (six) hours as needed for cough.  Marland Kitchen HYDROcodone-homatropine (HYDROMET) 5-1.5 MG/5ML syrup Take 5 mLs by mouth every 6 (six) hours as needed.  Marland Kitchen ipratropium-albuterol (DUONEB) 0.5-2.5 (3) MG/3ML SOLN Take 3 mLs by nebulization every 4 (four) hours as needed (if inhaler is not effective in resolving shortness of breath or wheezing). (Patient taking differently: Take 3 mLs by nebulization  every 4 (four) hours as needed (WITH FLUTTER VALVE FOR WHEEZING, SHORTNESS OF BREATH-PLAN C). )  . loratadine (CLARITIN) 10 MG tablet Take 10 mg by mouth daily.   . Methylcellulose, Laxative, (CITRUCEL PO) Take 1 packet by mouth daily.  . metoprolol succinate (TOPROL-XL) 25 MG 24 hr tablet Take 1 tablet (25 mg total) by mouth daily.  . nitroGLYCERIN (NITROSTAT) 0.4 MG SL tablet Place 1 tablet (0.4 mg total) under the tongue every 5 (five) minutes x 3 doses as needed for chest pain.  . predniSONE (DELTASONE) 10 MG tablet Take  4 each am x 2 days,   2 each am x 2 days,  1 each am x 2 days and stop  . predniSONE (DELTASONE) 5 MG tablet Take 7.5 mg by mouth daily with breakfast.  . ranitidine (ZANTAC) 300 MG tablet Take 300 mg by mouth 2 (two) times daily.  . simethicone (MYLICON) 0000000 MG chewable tablet Chew 125 mg by mouth every 6 (six) hours as needed for flatulence. TAKE PER BOX FOR GAS   . simvastatin (ZOCOR) 20 MG tablet TAKE ONE TABLET BY MOUTH ONCE DAILY AT  6  PM  . sodium bicarbonate 650 MG tablet Take 2 tablets (1,300 mg total) by mouth 2 (two) times daily. NEED OV.  . sodium chloride (OCEAN) 0.65 % SOLN nasal spray Place 1 spray into both nostrils as needed for congestion.  . SYMBICORT 160-4.5 MCG/ACT inhaler INHALE TWO PUFFS BY MOUTH TWICE DAILY  . tamsulosin (FLOMAX) 0.4 MG CAPS capsule TAKE ONE CAPSULE BY MOUTH ONCE DAILY IN THE EVENING  . traMADol (ULTRAM) 50 MG tablet 1-2 every 4 hours as needed for cough or pain   No facility-administered encounter medications on file as of 10/06/2016.      Review of Systems  Constitutional:   No  weight loss, night sweats,  Fevers, chills, + fatigue, or  lassitude.  HEENT:   No headaches,  Difficulty swallowing,  Tooth/dental problems, or  Sore throat,                No sneezing, itching, ear ache, + nasal congestion, post nasal drip,   CV:  No chest pain,  Orthopnea, PND, swelling in lower extremities, anasarca, dizziness, palpitations,  syncope.   GI  No heartburn, indigestion, abdominal pain, nausea, vomiting, diarrhea, change in bowel habits, loss of appetite, bloody stools.   Resp: .  No chest wall deformity  Skin: no rash or lesions.  GU: no dysuria, change in color of urine, no urgency or frequency.  No flank pain, no hematuria   MS:  No joint pain or swelling.  No decreased range of motion.  No back pain.    Physical Exam  BP (!) 102/58 (BP  Location: Left Arm, Cuff Size: Normal)   Pulse 86   Ht 5\' 7"  (1.702 m)   Wt 117 lb 3.2 oz (53.2 kg)   SpO2 97%   BMI 18.36 kg/m   GEN: A/Ox3; pleasant , NAD , edlery and frail    HEENT:  [place]/AT,  EACs-clear, TMs-wnl, NOSE-clear, THROAT-clear, no lesions, no postnasal drip or exudate noted.   NECK:  Supple w/ fair ROM; no JVD; normal carotid impulses w/o bruits; no thyromegaly or nodules palpated; no lymphadenopathy.    RESP  Decreased BS in bases ,  no accessory muscle use, no dullness to percussion  CARD:  RRR, no m/r/g, no peripheral edema, pulses intact, no cyanosis or clubbing.  GI:   Soft & nt; nml bowel sounds; no organomegaly or masses detected.   Musco: Warm bil, no deformities or joint swelling noted.   Neuro: alert, no focal deficits noted.    Skin: Warm, no lesions or rashes    Lab Results:  CBC   BNP  Imaging: Dg Chest 2 View  Result Date: 09/15/2016 CLINICAL DATA:  Cough and chest congestion for 3 weeks. EXAM: CHEST  2 VIEW COMPARISON:  08/21/2016 and earlier FINDINGS: The cardiac silhouette is normal in size. Moderate-sized hiatal hernia is similar to the prior study. The lungs remain hyperinflated with chronic interstitial densities and bronchiectasis, similar in appearance to multiple prior examinations including 06/09/2016. No definite acute airspace opacity, edema, pleural effusion, or pneumothorax is identified. No acute osseous abnormality is seen. IMPRESSION: Chronic lung changes without evidence of acute abnormality. Electronically  Signed   By: Logan Bores M.D.   On: 09/15/2016 17:23     Assessment & Plan:   Obstructive bronchiectasis ([place]) Recent flare now resolving  Hold on additional abx at this time.  Patient's medications were reviewed today and patient education was given. Computerized medication calendar was adjusted/completed   Plan  Advised on flutter use , ,may try hydromet , advised to use As needed  , may be sedation.  Patient Instructions  Continue on current regimen with flutter valve  May stop Tramadol , may use Hydromet 1 tsp every 6 hr as needed. -may make you sleepy  Follow med calendar closely and bring to each visit.  Follow up Dr. [name]  In 3 months and As needed          Rexene Edison, NP 10/06/2016

## 2016-10-06 NOTE — Progress Notes (Signed)
Chart and office note reviewed in detail along > agree with a/p as outlined  

## 2016-10-06 NOTE — Patient Instructions (Addendum)
Continue on current regimen with flutter valve  May stop Tramadol , may use Hydromet 1 tsp every 6 hr as needed. -may make you sleepy  Follow med calendar closely and bring to each visit.  Follow up Dr. [name]  In 3 months and As needed

## 2016-10-11 ENCOUNTER — Encounter (HOSPITAL_COMMUNITY)
Admission: RE | Admit: 2016-10-11 | Discharge: 2016-10-11 | Disposition: A | Discharge status: Home / Self Care | Payer: Medicare Other | Source: Ambulatory Visit | Attending: Nephrology | Admitting: Nephrology

## 2016-10-11 DIAGNOSIS — N183 Chronic kidney disease, stage 3 (moderate): Secondary | ICD-10-CM | POA: Diagnosis present

## 2016-10-11 DIAGNOSIS — N184 Chronic kidney disease, stage 4 (severe): Secondary | ICD-10-CM

## 2016-10-11 DIAGNOSIS — D631 Anemia in chronic kidney disease: Secondary | ICD-10-CM | POA: Insufficient documentation

## 2016-10-11 DIAGNOSIS — Z79899 Other long term (current) drug therapy: Secondary | ICD-10-CM | POA: Diagnosis not present

## 2016-10-11 DIAGNOSIS — Z5181 Encounter for therapeutic drug level monitoring: Secondary | ICD-10-CM | POA: Diagnosis not present

## 2016-10-11 LAB — POCT HEMOGLOBIN-HEMACUE: HEMOGLOBIN: 10.4 g/dL — AB (ref 13.0–17.0)

## 2016-10-11 MED ORDER — EPOETIN ALFA 20000 UNIT/ML IJ SOLN
INTRAMUSCULAR | Status: AC
  Administered 2016-10-11: 13:00:00 20000 [IU] via SUBCUTANEOUS
  Filled 2016-10-11: qty 1

## 2016-10-11 MED ORDER — EPOETIN ALFA 40000 UNIT/ML IJ SOLN: 30000.0000 [IU] | INTRAMUSCULAR | Status: DC

## 2016-10-11 MED ORDER — EPOETIN ALFA 10000 UNIT/ML IJ SOLN
INTRAMUSCULAR | Status: AC
  Administered 2016-10-11: 13:00:00 10000 [IU] via SUBCUTANEOUS
  Filled 2016-10-11: qty 1

## 2016-10-12 ENCOUNTER — Ambulatory Visit: Payer: Medicare Other | Admitting: Internal Medicine

## 2016-10-13 ENCOUNTER — Telehealth: Payer: Self-pay | Admitting: Adult Health

## 2016-10-13 NOTE — Telephone Encounter (Signed)
Copy of 3.1.18 med calendar printed for pt Called spoke with patient to verify home mailing address Med calendar placed in the mail Nothing further needed; will sign off

## 2016-10-13 NOTE — Telephone Encounter (Signed)
Spoke with pt. States that he did not get a copy of his med calender when he was here for his appointment on 10/06/16. He would like to have this mailed to him.  Janett Billow - can you help with this? I don't have access to print this off. Thanks.

## 2016-10-18 ENCOUNTER — Other Ambulatory Visit: Payer: Self-pay

## 2016-10-18 ENCOUNTER — Telehealth: Payer: Self-pay | Admitting: Internal Medicine

## 2016-10-18 ENCOUNTER — Emergency Department (HOSPITAL_COMMUNITY): Payer: Medicare Other

## 2016-10-18 ENCOUNTER — Emergency Department (HOSPITAL_COMMUNITY)
Admission: EM | Admit: 2016-10-18 | Discharge: 2016-10-18 | Disposition: A | Discharge status: Home / Self Care | Payer: Medicare Other | Attending: Emergency Medicine | Admitting: Emergency Medicine

## 2016-10-18 ENCOUNTER — Encounter (HOSPITAL_COMMUNITY): Payer: Self-pay

## 2016-10-18 ENCOUNTER — Encounter (HOSPITAL_COMMUNITY)
Admission: RE | Admit: 2016-10-18 | Discharge: 2016-10-18 | Disposition: A | Discharge status: Home / Self Care | Payer: Medicare Other | Source: Ambulatory Visit | Attending: Nephrology | Admitting: Nephrology

## 2016-10-18 DIAGNOSIS — N184 Chronic kidney disease, stage 4 (severe): Secondary | ICD-10-CM

## 2016-10-18 DIAGNOSIS — E860 Dehydration: Secondary | ICD-10-CM | POA: Diagnosis not present

## 2016-10-18 DIAGNOSIS — I2510 Atherosclerotic heart disease of native coronary artery without angina pectoris: Secondary | ICD-10-CM | POA: Diagnosis not present

## 2016-10-18 DIAGNOSIS — R0602 Shortness of breath: Secondary | ICD-10-CM | POA: Diagnosis present

## 2016-10-18 DIAGNOSIS — J441 Chronic obstructive pulmonary disease with (acute) exacerbation: Secondary | ICD-10-CM | POA: Diagnosis not present

## 2016-10-18 DIAGNOSIS — Z955 Presence of coronary angioplasty implant and graft: Secondary | ICD-10-CM | POA: Diagnosis not present

## 2016-10-18 DIAGNOSIS — E039 Hypothyroidism, unspecified: Secondary | ICD-10-CM | POA: Insufficient documentation

## 2016-10-18 DIAGNOSIS — Z79899 Other long term (current) drug therapy: Secondary | ICD-10-CM | POA: Diagnosis not present

## 2016-10-18 DIAGNOSIS — N183 Chronic kidney disease, stage 3 (moderate): Secondary | ICD-10-CM | POA: Diagnosis not present

## 2016-10-18 DIAGNOSIS — I129 Hypertensive chronic kidney disease with stage 1 through stage 4 chronic kidney disease, or unspecified chronic kidney disease: Secondary | ICD-10-CM | POA: Insufficient documentation

## 2016-10-18 LAB — I-STAT TROPONIN, ED: TROPONIN I, POC: 0 ng/mL (ref 0.00–0.08)

## 2016-10-18 LAB — I-STAT CG4 LACTIC ACID, ED
Lactic Acid, Venous: 2.66 mmol/L (ref 0.5–1.9)
Lactic Acid, Venous: 1.04 mmol/L (ref 0.5–1.9)

## 2016-10-18 LAB — CBC WITH DIFFERENTIAL/PLATELET
BASOS PCT: 0 %
Basophils Absolute: 0 10*3/uL (ref 0.0–0.1)
EOS PCT: 1 %
Eosinophils Absolute: 0.1 10*3/uL (ref 0.0–0.7)
HCT: 37 % — ABNORMAL LOW (ref 39.0–52.0)
HEMOGLOBIN: 11.7 g/dL — AB (ref 13.0–17.0)
LYMPHS PCT: 9 %
Lymphs Abs: 0.7 10*3/uL (ref 0.7–4.0)
MCH: 35.3 pg — ABNORMAL HIGH (ref 26.0–34.0)
MCHC: 31.6 g/dL (ref 30.0–36.0)
MCV: 111.8 fL — AB (ref 78.0–100.0)
MONO ABS: 0.4 10*3/uL (ref 0.1–1.0)
MONOS PCT: 5 %
NEUTROS PCT: 85 %
Neutro Abs: 6.8 10*3/uL (ref 1.7–7.7)
PLATELETS: 149 10*3/uL — AB (ref 150–400)
RBC: 3.31 MIL/uL — ABNORMAL LOW (ref 4.22–5.81)
RDW: 17.7 % — ABNORMAL HIGH (ref 11.5–15.5)
WBC: 8 10*3/uL (ref 4.0–10.5)

## 2016-10-18 LAB — COMPREHENSIVE METABOLIC PANEL
ALBUMIN: 3.2 g/dL — AB (ref 3.5–5.0)
ALT: 10 U/L — ABNORMAL LOW (ref 17–63)
ANION GAP: 12 (ref 5–15)
AST: 20 U/L (ref 15–41)
Alkaline Phosphatase: 74 U/L (ref 38–126)
BUN: 29 mg/dL — AB (ref 6–20)
CHLORIDE: 105 mmol/L (ref 101–111)
CO2: 24 mmol/L (ref 22–32)
Calcium: 8.8 mg/dL — ABNORMAL LOW (ref 8.9–10.3)
Creatinine, Ser: 2.21 mg/dL — ABNORMAL HIGH (ref 0.61–1.24)
GFR calc Af Amer: 31 mL/min — ABNORMAL LOW (ref >60)
GFR calc non Af Amer: 27 mL/min — ABNORMAL LOW (ref >60)
GLUCOSE: 119 mg/dL — AB (ref 65–99)
POTASSIUM: 4.2 mmol/L (ref 3.5–5.1)
SODIUM: 141 mmol/L (ref 135–145)
TOTAL PROTEIN: 5.6 g/dL — AB (ref 6.5–8.1)
Total Bilirubin: 0.7 mg/dL (ref 0.3–1.2)

## 2016-10-18 LAB — PROTIME-INR
INR: 0.99
Prothrombin Time: 13.1 seconds (ref 11.4–15.2)

## 2016-10-18 LAB — POCT HEMOGLOBIN-HEMACUE: Hemoglobin: 12.1 g/dL — ABNORMAL LOW (ref 13.0–17.0)

## 2016-10-18 MED ORDER — PREDNISONE 20 MG PO TABS: 40.0000 mg | ORAL_TABLET | Freq: Every day | ORAL | 0 refills | Status: DC

## 2016-10-18 MED ORDER — PREDNISONE 20 MG PO TABS
40.0000 mg | ORAL_TABLET | Freq: Once | ORAL | Status: AC
  Administered 2016-10-18: 40 mg via ORAL
  Filled 2016-10-18: qty 2

## 2016-10-18 MED ORDER — SODIUM CHLORIDE 0.9 % IV BOLUS (SEPSIS)
1000.0000 mL | Freq: Once | INTRAVENOUS | Status: AC
  Administered 2016-10-18: 1000 mL via INTRAVENOUS

## 2016-10-18 MED ORDER — EPOETIN ALFA 40000 UNIT/ML IJ SOLN: 30000.0000 [IU] | INTRAMUSCULAR | Status: DC

## 2016-10-18 NOTE — ED Notes (Signed)
Papers reviewed with patient and he verbalizes understanding  

## 2016-10-18 NOTE — ED Triage Notes (Signed)
Per Pt, Pt was diagnosed with the flu on January 15th. Pt reports being in the hospital and sent home. Since the epsiode, pt has had a cough that has continued. Productive cough with yellow sputum noted. Pt was being seen to have blood drawn when he was noted to be SOB and have low blood pressure. Spent the last week on Levaquin for the cough with no relief.

## 2016-10-18 NOTE — ED Provider Notes (Addendum)
[facility] DEPT Provider Note   CSN: [id number] Arrival date & time: 10/18/16  1230     History   Chief Complaint Chief Complaint  Patient presents with  . Shortness of Breath    HPI HOLT WOOLBRIGHT is a 81 y.o. male.  Patient is a 81 year old male with a history of coronary artery disease status post stenting, asthma, anemia, Wegner's granulomatosis, cardiomyopathy presenting today with worsening productive cough, generalized weakness and shortness of breath. Patient states he was diagnosed with flu in January and spent 2 days in the hospital. However he feels he's never fully recovered. Over the last 2 weeks he has developed more of a productive cough of green and yellow sputum with worsening generalized weakness, fatigue, poor appetite. Patient started Levaquin and completed a course last week without improvement in his symptoms. Today he was getting his routine shot at the hospital to keep his hemoglobin up and he was noted to be tachycardic and hypotensive. Patient states with exertion he is short of breath but is okay at rest. He denies any new swelling or calf pain. He takes 7.5 mg of prednisone daily which she has been on prolonged time but has not recently increased his steroid use.   The history is provided by the patient.    Past Medical History:  Diagnosis Date  . Adenomatous colon polyp   . Anemia   . Asthma   . BPH (benign prostatic hyperplasia)    sees Dr. Risa Grill, biopsy June 2015 was benign   . Bronchiectasis ([place])   . CAD (coronary artery disease)    a. [place] s/p [place] x3 and ultimately BMS to pLAD& POBA to CTO of circumflex/marginal vessel on 03/17/14 and staged BMS to [facility] on 03/21/14  . Cataract   . Diverticulosis   . Femoral artery pseudo-aneurysm, right ([place])    a. s/p repair. Follow up with Dr. Kellie Simmering   . Fungal infection    lungs  . GERD (gastroesophageal reflux disease)   . Hiatal hernia   . HOH (hard of hearing)    bilaterally  . HTN (hypertension)  09/28/2013  . On home oxygen therapy    uses 2 l/m nasally at bedtime  . Peptic stricture of esophagus   . Wegener's granulomatosis ([place])    sees Dr. [name]     Patient Active Problem List   Diagnosis Date Noted  . Malnutrition of moderate degree 08/22/2016  . Influenza A 08/21/2016  . AVM (arteriovenous malformation) of colon with hemorrhage   . Symptomatic anemia 07/30/2016  . Gastrointestinal hemorrhage with melena 07/30/2016  . Aortic valve stenosis 07/08/2016  . Protein-calorie malnutrition, severe ([place]) 04/21/2015  . CAP (community acquired pneumonia) 04/18/2015  . Chronic kidney disease, stage IV (severe) ([place]) 03/26/2015  . Wegener's granulomatosis with renal involvement ([place]) 07/15/2014  . Pseudoaneurysm of right femoral artery ([place]) 04/08/2014  . CAD- severe 4 V CAD- not CABG candidate 03/17/2014  . Anemia 03/17/2014  . Cardiomyopathy, ischemic- EF 40% by [facility] ([facility]) 03/17/2014  . Unstable Class III Angina 03/13/2014  . Abnormal nuclear stress test: Intermediate risk with moderate region of apical and inferior scar with mild superimposed ischemia; EF 40% 03/12/2014  . Loss of weight 12/12/2013  . Abdominal pain, unspecified site 12/12/2013  . SVT (supraventricular tachycardia) ([place]) 10/04/2013  . SOB (shortness of breath) 10/04/2013  . Weakness generalized 10/04/2013  . Hypertension 09/28/2013  . Chronic rhinitis 09/27/2013  . Chronic respiratory failure ([place]) 12/26/2012  . Bronchiectasis with acute exacerbation ([place]) 09/28/2011  .  OTHER DYSPHAGIA 10/25/2010  . Wegener's granulomatosis ([place]) 05/10/2010  . GERD 03/10/2010  . ARTHRITIS, CERVICAL SPINE 03/10/2010  . Hypothyroidism 11/13/2008  . VITAMIN D DEFICIENCY 11/13/2008  . Hyperlipidemia 11/13/2008  . ANEMIA 11/13/2008  . BPH (benign prostatic hyperplasia) 12/04/2007  . Obstructive bronchiectasis ([place]) 01/25/2007    Past Surgical History:  Procedure Laterality Date  . CARDIAC CATHETERIZATION  03/17/2014    Procedure: CORONARY BALLOON ANGIOPLASTY;  Surgeon: Troy Sine, MD;  Location: [facility] CATH LAB;  Service: Cardiovascular;;  . CATARACT EXTRACTION, BILATERAL  12-18-12   bilateral  . CHOLECYSTECTOMY N/A 12/21/2012   Procedure: LAPAROSCOPIC CHOLECYSTECTOMY WITH INTRAOPERATIVE CHOLANGIOGRAM;  Surgeon: Edward Jolly, MD;  Location: [facility] ORS;  Service: General;  Laterality: N/A;  . COLONOSCOPY  08-16-11   per Dr. Fuller Plan, diverticulosis and polyps, repeat in 5 yrs   . COLONOSCOPY N/A 08/01/2016   Procedure: COLONOSCOPY;  Surgeon: Carol Ada, MD;  Location: [facility] ENDOSCOPY;  Service: Endoscopy;  Laterality: N/A;  . CORONARY ANGIOPLASTY  03/2014  . ESOPHAGOGASTRODUODENOSCOPY N/A 07/31/2016   Procedure: ESOPHAGOGASTRODUODENOSCOPY (EGD);  Surgeon: Carol Ada, MD;  Location: [facility];  Service: Endoscopy;  Laterality: N/A;  . ESOPHAGOGASTRODUODENOSCOPY (EGD) WITH ESOPHAGEAL DILATION  11-29-10   per Dr. Fuller Plan   . EYE SURGERY     cataracts removed.   Marland Kitchen HEMATOMA EVACUATION Right 03/19/2014   Procedure: Suture repair of femoral artery with evacuation of hematoma;  Surgeon: Mal Misty, MD;  Location: [facility];  Service: Vascular;  Laterality: Right;  . HERNIA REPAIR    . LEFT HEART CATHETERIZATION WITH CORONARY ANGIOGRAM N/A 03/14/2014   Procedure: LEFT HEART CATHETERIZATION WITH CORONARY ANGIOGRAM;  Surgeon: Leonie Man, MD;  Location: [facility] CATH LAB;  Service: Cardiovascular;  Laterality: N/A;  . PERCUTANEOUS CORONARY STENT INTERVENTION (PCI-S) N/A 03/17/2014   Procedure: PERCUTANEOUS CORONARY STENT INTERVENTION (PCI-S);  Surgeon: Troy Sine, MD;  Location: [facility] CATH LAB;  Service: Cardiovascular;  Laterality: N/A;  . PERCUTANEOUS CORONARY STENT INTERVENTION (PCI-S) N/A 03/21/2014   Procedure: PERCUTANEOUS CORONARY STENT INTERVENTION (PCI-S);  Surgeon: Burnell Blanks, MD;  Location: [facility] CATH LAB;  Service: Cardiovascular;  Laterality: N/A;  . RENAL BIOPSY         Home Medications    Prior  to Admission medications   Medication Sig Start Date End Date Taking? Authorizing Provider  Acetaminophen (TYLENOL) 325 MG CAPS Take 325 mg by mouth every 6 (six) hours as needed (pain). TAKE PER BOX AS NEEDED FOR PAIN     Historical Provider, MD  albuterol (VENTOLIN HFA) 108 (90 Base) MCG/ACT inhaler Inhale 1-2 puffs into the lungs every 4 (four) hours as needed for wheezing or shortness of breath. 07/12/16   Tanda Rockers, MD  amoxicillin (AMOXIL) 875 MG tablet Take 875 mg by mouth 2 (two) times daily.    Historical Provider, MD  b complex vitamins tablet Take 1 tablet by mouth daily.      Historical Provider, MD  bisacodyl (DULCOLAX) 5 MG EC tablet Take 5 mg by mouth 2 (two) times daily.     Historical Provider, MD  calcitRIOL (ROCALTROL) 0.25 MCG capsule Take 0.25 mcg by mouth daily.  09/06/15   Historical Provider, MD  cyclophosphamide (CYTOXAN) 25 MG capsule Take 25 mg by mouth daily. Take one capsule by mouth daily on an empty stomach 1 hour before or 2 hours after meal. 07/26/16   Historical Provider, MD  dextromethorphan-guaiFENesin (MUCINEX DM) 30-600 MG 12hr tablet Take 1 tablet by mouth 2 (  two) times daily.    Historical Provider, MD  finasteride (PROSCAR) 5 MG tablet TAKE ONE TABLET BY MOUTH ONCE DAILY 08/29/16   [name], MD  guaiFENesin-codeine 100-10 MG/5ML syrup Take 10 mLs by mouth every 6 (six) hours as needed for cough. 08/23/16   Eber Jones, MD  HYDROcodone-homatropine (HYDROMET) 5-1.5 MG/5ML syrup Take 5 mLs by mouth every 6 (six) hours as needed. 10/06/16   Tammy S Parrett, NP  ipratropium-albuterol (DUONEB) 0.5-2.5 (3) MG/3ML SOLN Take 3 mLs by nebulization every 4 (four) hours as needed (if inhaler is not effective in resolving shortness of breath or wheezing). Patient taking differently: Take 3 mLs by nebulization every 4 (four) hours as needed (WITH FLUTTER VALVE FOR WHEEZING, SHORTNESS OF BREATH-PLAN C).  02/03/16   Tanda Rockers, MD  loratadine (CLARITIN) 10 MG  tablet Take 10 mg by mouth daily.     Historical Provider, MD  Methylcellulose, Laxative, (CITRUCEL PO) Take 1 packet by mouth daily.    Historical Provider, MD  metoprolol succinate (TOPROL-XL) 25 MG 24 hr tablet Take 1 tablet (25 mg total) by mouth daily. 07/18/16   Pixie Casino, MD  nitroGLYCERIN (NITROSTAT) 0.4 MG SL tablet Place 1 tablet (0.4 mg total) under the tongue every 5 (five) minutes x 3 doses as needed for chest pain. 03/24/15   Pixie Casino, MD  predniSONE (DELTASONE) 10 MG tablet Take  4 each am x 2 days,   2 each am x 2 days,  1 each am x 2 days and stop 09/15/16   Tanda Rockers, MD  predniSONE (DELTASONE) 5 MG tablet Take 7.5 mg by mouth daily with breakfast.    Historical Provider, MD  ranitidine (ZANTAC) 300 MG tablet Take 300 mg by mouth 2 (two) times daily.    Historical Provider, MD  simethicone (MYLICON) 485 MG chewable tablet Chew 125 mg by mouth every 6 (six) hours as needed for flatulence. TAKE PER BOX FOR GAS     Historical Provider, MD  simvastatin (ZOCOR) 20 MG tablet TAKE ONE TABLET BY MOUTH ONCE DAILY AT  6  PM 07/07/16   Pixie Casino, MD  sodium bicarbonate 650 MG tablet Take 2 tablets (1,300 mg total) by mouth 2 (two) times daily. NEED OV. 08/12/15   Pixie Casino, MD  sodium chloride (OCEAN) 0.65 % SOLN nasal spray Place 1 spray into both nostrils as needed for congestion.    Historical Provider, MD  SYMBICORT 160-4.5 MCG/ACT inhaler INHALE TWO PUFFS BY MOUTH TWICE DAILY 06/01/16   Tanda Rockers, MD  tamsulosin (FLOMAX) 0.4 MG CAPS capsule TAKE ONE CAPSULE BY MOUTH ONCE DAILY IN THE EVENING 07/05/16   [name], MD  traMADol Veatrice [place]bon) 50 MG tablet 1-2 every 4 hours as needed for cough or pain 09/15/16   Tanda Rockers, MD    Family History Family History  Problem Relation Age of Onset  . Asthma Father   . Coronary artery disease Mother   . Coronary artery disease Brother   . Coronary artery disease Brother   . Colon cancer Neg Hx   . Stomach cancer  Neg Hx     Social History Social History  Substance Use Topics  . Smoking status: Never Smoker  . Smokeless tobacco: Never Used  . Alcohol use No     Allergies   Patient has no known allergies.   Review of Systems Review of Systems  All other systems reviewed and are negative.  Physical Exam Updated Vital Signs BP 120/82   Pulse 86   Temp 97.9 F (36.6 C) (Oral)   Resp 26   Ht 5\' 7"  (1.702 m)   Wt 117 lb (53.1 kg)   SpO2 100%   BMI 18.32 kg/m   Physical Exam  Constitutional: He is oriented to person, place, and time. He appears well-developed. He appears cachectic. No distress.  HENT:  Head: Normocephalic and atraumatic.  Mouth/Throat: Oropharynx is clear and moist. Mucous membranes are dry.  Eyes: Conjunctivae and EOM are normal. Pupils are equal, round, and reactive to light.  Neck: Normal range of motion. Neck supple.  Cardiovascular: Regular rhythm and intact distal pulses.  Tachycardia present.   Murmur heard.  Systolic murmur is present with a grade of 2/6  Pulmonary/Chest: Effort normal. No respiratory distress. He has no wheezes. He has rhonchi in the right upper field and the right middle field. He has no rales.  Abdominal: Soft. He exhibits no distension. There is no tenderness. There is no rebound and no guarding.  Musculoskeletal: Normal range of motion. He exhibits no edema or tenderness.  Healing ecchymosis over the upper arms  Neurological: He is alert and oriented to person, place, and time.  Skin: Skin is warm and dry. No rash noted. No erythema.  Psychiatric: He has a normal mood and affect. His behavior is normal.  Nursing note and vitals reviewed.    ED Treatments / Results  Labs (all labs ordered are listed, but only abnormal results are displayed) Labs Reviewed  COMPREHENSIVE METABOLIC PANEL - Abnormal; Notable for the following:       Result Value   Glucose, Bld 119 (*)    BUN 29 (*)    Creatinine, Ser 2.21 (*)    Calcium 8.8  (*)    Total Protein 5.6 (*)    Albumin 3.2 (*)    ALT 10 (*)    GFR calc non Af Amer 27 (*)    GFR calc Af Amer 31 (*)    All other components within normal limits  CBC WITH DIFFERENTIAL/PLATELET - Abnormal; Notable for the following:    RBC 3.31 (*)    Hemoglobin 11.7 (*)    HCT 37.0 (*)    MCV 111.8 (*)    [name] 35.3 (*)    RDW 17.7 (*)    Platelets 149 (*)    All other components within normal limits  I-STAT CG4 LACTIC ACID, ED - Abnormal; Notable for the following:    Lactic Acid, Venous 2.66 (*)    All other components within normal limits  CULTURE, BLOOD (ROUTINE X 2)  CULTURE, BLOOD (ROUTINE X 2)  PROTIME-INR  URINALYSIS, ROUTINE W REFLEX MICROSCOPIC    EKG  EKG Interpretation  Date/Time:  Tuesday October 18 2016 12:41:06 EDT Ventricular Rate:  131 PR Interval:    QRS Duration: 84 QT Interval:  330 QTC Calculation: 487 R Axis:   74 Text Interpretation:  Atrial tachycardia Premature ventricular complexes Anteroseptal infarct , age undetermined Confirmed by Maryan Rued  MD, Rhyland Hinderliter ([id number]) on 10/18/2016 2:42:50 PM       Radiology Dg Chest 2 View  Result Date: 10/18/2016 CLINICAL DATA:  Cough x 8-12 weeks, flu in early January, chronic bronchitis EXAM: CHEST  2 VIEW COMPARISON:  Chest radiographs dated 09/15/2016. CT chest dated 07/30/2016. FINDINGS: Reticulonodular opacities with bronchiectasis in the right upper lobe, right middle lobe, lingula, and bilateral lower lobes. This appearance is unchanged from the priors and favors chronic  atypical mycobacterial infection. No superimposed opacities suspicious for pneumonia. Minimal blunting of the bilateral costophrenic angles with possible trace right pleural effusion. No pneumothorax. The heart is normal in size. Visualized osseous structures are within normal limits. 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 No evidence of acute cardiopulmonary disease. Electronically Signed   By: Julian Hy M.D.   On: 10/18/2016 13:31    Procedures Procedures (including critical care time)  Medications Ordered in ED Medications  sodium chloride 0.9 % bolus 1,000 mL (1,000 mLs Intravenous New Bag/Given 10/18/16 1311)     Initial Impression / Assessment and Plan / ED Course  I have reviewed the triage vital signs and the nursing notes.  Pertinent labs & imaging results that were available during my care of the patient were reviewed by me and considered in my medical decision making (see chart for details).     Patient presenting with shortness of breath, tachycardia, borderline blood pressures that has worsened over the last 2 weeks. He did complete a course of Levaquin does not feel that his symptoms have improved. On exam he has rhonchi in the right side of lungs without significant wheezing. Patient also appears dehydrated. No abdominal symptoms at this time. EKG shows more of an atrial tachycardia but do not feel that it is A. fib at this time.  Patient had sepsis labs ordered in the waiting room but he is afebrile this time. Blood pressure is between 70 systolic and low 453M. Heart rate is between 110 to 130.  At rest patient denies shortness of breath and is in no acute distress at this time. He can speak in full sentences. He was given IV fluids. Chest x-ray  shows stable atypical mycobacterial infection and no acute findings. CBC without acute findings. CMP with creatinine that's relatively stable.  Lactic acid elevated at 2.66. Patient given 1 L of IV fluid. Troponin pending.  After that repeat vital signs with improved HR <100 and BP 468'E systolic.  Repeat lactate 1.04.  Trop neg.  patient feeling much better after fluids. Patient ambulated here with oxygen saturation of 93% or greater. He denied any shortness of breath with ambulating. Patient's concern for dehydration and COPD exacerbation. He has just finished a course of antibiotics. Recommended a steroid taper over the next week  and continuing his inhalers. Discussed with him return precautions and he and his wife are agreeable to plan  Final Clinical Impressions(s) / ED Diagnoses   Final diagnoses:  COPD exacerbation (HCC)  Dehydration    New Prescriptions New Prescriptions   PREDNISONE (DELTASONE) 20 MG TABLET    Take 2 tablets (40 mg total) by mouth daily. 2 tabs (40mg ) for 3 days then 1 tab (20mg ) for 2 days then 1/2 tab (10mg ) for 2 days then start back on your normal prednisone     Blanchie Dessert, MD 10/18/16 [place], MD 10/22/16 [phone]

## 2016-10-18 NOTE — Telephone Encounter (Signed)
Spoke with Richard [name] at [facility] patient currently there for injection for anemia however he is not needing today. Per Richard [name] patient is feeling weak and shortness of breath, has been feeling this way for a few days HR 140 reg and blood pressure 91/53.  Advised Richard [name] patient needed to go to ED for evaluation, verbalized understanding  Called and informed Wannetta Sender

## 2016-10-18 NOTE — Telephone Encounter (Signed)
New message      Pt arrived for an outpt injection for anemia but is c/o sob, heart rate is 140 and bp is 91/53.  Please advise

## 2016-10-18 NOTE — Progress Notes (Signed)
Pt here today for his procrit injection.  Hemocue today 12.1 and shot not needed.  However, upon assessment of VS pt found to be tachycardic in the 140's and palpated to feel regular, and BP 91/53, pt stated he has been SOB the last few days and denies chest pain.  Shelton Silvas, RN called and spoke with Rip Harbour at [facility] office, who patient stated is his cardiologist, and they gave orders to send patient to the ER for evaluation.

## 2016-10-18 NOTE — ED Notes (Signed)
Ambulated patient to monitor pulse ox. Sats didn't drop below 93% and patient stated that he felt fine

## 2016-10-23 LAB — CULTURE, BLOOD (ROUTINE X 2)
CULTURE: NO GROWTH
CULTURE: NO GROWTH

## 2016-11-01 ENCOUNTER — Encounter (HOSPITAL_COMMUNITY): Payer: Self-pay | Admitting: Internal Medicine

## 2016-11-01 ENCOUNTER — Inpatient Hospital Stay (HOSPITAL_COMMUNITY)
Admission: EM | Admit: 2016-11-01 | Discharge: 2016-11-04 | DRG: 812 | Disposition: A | Discharge status: Home / Self Care | Payer: Medicare Other | Attending: Internal Medicine | Admitting: Internal Medicine

## 2016-11-01 ENCOUNTER — Other Ambulatory Visit: Payer: Self-pay

## 2016-11-01 ENCOUNTER — Encounter (HOSPITAL_COMMUNITY)
Admission: RE | Admit: 2016-11-01 | Discharge: 2016-11-01 | Disposition: A | Discharge status: Home / Self Care | Payer: Medicare Other | Source: Ambulatory Visit | Attending: Nephrology | Admitting: Nephrology

## 2016-11-01 ENCOUNTER — Inpatient Hospital Stay (HOSPITAL_COMMUNITY): Payer: Medicare Other

## 2016-11-01 DIAGNOSIS — I129 Hypertensive chronic kidney disease with stage 1 through stage 4 chronic kidney disease, or unspecified chronic kidney disease: Secondary | ICD-10-CM | POA: Diagnosis present

## 2016-11-01 DIAGNOSIS — E039 Hypothyroidism, unspecified: Secondary | ICD-10-CM | POA: Diagnosis present

## 2016-11-01 DIAGNOSIS — I350 Nonrheumatic aortic (valve) stenosis: Secondary | ICD-10-CM | POA: Diagnosis present

## 2016-11-01 DIAGNOSIS — Z961 Presence of intraocular lens: Secondary | ICD-10-CM | POA: Diagnosis present

## 2016-11-01 DIAGNOSIS — N184 Chronic kidney disease, stage 4 (severe): Secondary | ICD-10-CM

## 2016-11-01 DIAGNOSIS — D631 Anemia in chronic kidney disease: Secondary | ICD-10-CM | POA: Diagnosis present

## 2016-11-01 DIAGNOSIS — M3130 Wegener's granulomatosis without renal involvement: Secondary | ICD-10-CM | POA: Diagnosis present

## 2016-11-01 DIAGNOSIS — R0600 Dyspnea, unspecified: Secondary | ICD-10-CM

## 2016-11-01 DIAGNOSIS — D649 Anemia, unspecified: Secondary | ICD-10-CM | POA: Diagnosis not present

## 2016-11-01 DIAGNOSIS — K219 Gastro-esophageal reflux disease without esophagitis: Secondary | ICD-10-CM | POA: Diagnosis not present

## 2016-11-01 DIAGNOSIS — Z79899 Other long term (current) drug therapy: Secondary | ICD-10-CM | POA: Diagnosis not present

## 2016-11-01 DIAGNOSIS — I255 Ischemic cardiomyopathy: Secondary | ICD-10-CM | POA: Diagnosis present

## 2016-11-01 DIAGNOSIS — I10 Essential (primary) hypertension: Secondary | ICD-10-CM | POA: Diagnosis present

## 2016-11-01 DIAGNOSIS — Z9841 Cataract extraction status, right eye: Secondary | ICD-10-CM

## 2016-11-01 DIAGNOSIS — D62 Acute posthemorrhagic anemia: Principal | ICD-10-CM | POA: Diagnosis present

## 2016-11-01 DIAGNOSIS — Z7951 Long term (current) use of inhaled steroids: Secondary | ICD-10-CM

## 2016-11-01 DIAGNOSIS — J479 Bronchiectasis, uncomplicated: Secondary | ICD-10-CM | POA: Diagnosis present

## 2016-11-01 DIAGNOSIS — Z9861 Coronary angioplasty status: Secondary | ICD-10-CM | POA: Diagnosis not present

## 2016-11-01 DIAGNOSIS — H9193 Unspecified hearing loss, bilateral: Secondary | ICD-10-CM | POA: Diagnosis present

## 2016-11-01 DIAGNOSIS — I2510 Atherosclerotic heart disease of native coronary artery without angina pectoris: Secondary | ICD-10-CM | POA: Diagnosis present

## 2016-11-01 DIAGNOSIS — Z9981 Dependence on supplemental oxygen: Secondary | ICD-10-CM | POA: Diagnosis not present

## 2016-11-01 DIAGNOSIS — R058 Other specified cough: Secondary | ICD-10-CM

## 2016-11-01 DIAGNOSIS — E784 Other hyperlipidemia: Secondary | ICD-10-CM | POA: Diagnosis not present

## 2016-11-01 DIAGNOSIS — Z7982 Long term (current) use of aspirin: Secondary | ICD-10-CM | POA: Diagnosis not present

## 2016-11-01 DIAGNOSIS — E785 Hyperlipidemia, unspecified: Secondary | ICD-10-CM | POA: Diagnosis present

## 2016-11-01 DIAGNOSIS — J9610 Chronic respiratory failure, unspecified whether with hypoxia or hypercapnia: Secondary | ICD-10-CM | POA: Diagnosis present

## 2016-11-01 DIAGNOSIS — Z955 Presence of coronary angioplasty implant and graft: Secondary | ICD-10-CM

## 2016-11-01 DIAGNOSIS — Z7902 Long term (current) use of antithrombotics/antiplatelets: Secondary | ICD-10-CM | POA: Diagnosis not present

## 2016-11-01 DIAGNOSIS — Z86010 Personal history of colonic polyps: Secondary | ICD-10-CM

## 2016-11-01 DIAGNOSIS — Z9842 Cataract extraction status, left eye: Secondary | ICD-10-CM

## 2016-11-01 DIAGNOSIS — N183 Chronic kidney disease, stage 3 (moderate): Secondary | ICD-10-CM | POA: Diagnosis present

## 2016-11-01 DIAGNOSIS — J9611 Chronic respiratory failure with hypoxia: Secondary | ICD-10-CM | POA: Diagnosis not present

## 2016-11-01 DIAGNOSIS — K921 Melena: Secondary | ICD-10-CM | POA: Diagnosis present

## 2016-11-01 DIAGNOSIS — E034 Atrophy of thyroid (acquired): Secondary | ICD-10-CM | POA: Diagnosis not present

## 2016-11-01 DIAGNOSIS — Z5181 Encounter for therapeutic drug level monitoring: Secondary | ICD-10-CM | POA: Diagnosis not present

## 2016-11-01 DIAGNOSIS — Z87442 Personal history of urinary calculi: Secondary | ICD-10-CM

## 2016-11-01 DIAGNOSIS — N400 Enlarged prostate without lower urinary tract symptoms: Secondary | ICD-10-CM | POA: Diagnosis present

## 2016-11-01 DIAGNOSIS — R05 Cough: Secondary | ICD-10-CM

## 2016-11-01 HISTORY — DX: Nonrheumatic aortic (valve) stenosis: I35.0

## 2016-11-01 HISTORY — DX: Pneumonia, unspecified organism: J18.9

## 2016-11-01 HISTORY — DX: Personal history of other medical treatment: Z92.89

## 2016-11-01 HISTORY — DX: Unspecified chronic bronchitis: J42

## 2016-11-01 HISTORY — DX: Personal history of urinary calculi: Z87.442

## 2016-11-01 HISTORY — DX: Chronic obstructive pulmonary disease, unspecified: J44.9

## 2016-11-01 LAB — CBC WITH DIFFERENTIAL/PLATELET
BASOS PCT: 0 %
Basophils Absolute: 0 10*3/uL (ref 0.0–0.1)
Eosinophils Absolute: 0.1 10*3/uL (ref 0.0–0.7)
Eosinophils Relative: 1 %
HEMATOCRIT: 20.7 % — AB (ref 39.0–52.0)
HEMOGLOBIN: 6.8 g/dL — AB (ref 13.0–17.0)
LYMPHS ABS: 0.4 10*3/uL — AB (ref 0.7–4.0)
LYMPHS PCT: 5 %
MCH: 36.8 pg — AB (ref 26.0–34.0)
MCHC: 32.9 g/dL (ref 30.0–36.0)
MCV: 111.9 fL — AB (ref 78.0–100.0)
MONOS PCT: 8 %
Monocytes Absolute: 0.6 10*3/uL (ref 0.1–1.0)
NEUTROS ABS: 6.4 10*3/uL (ref 1.7–7.7)
Neutrophils Relative %: 86 %
Platelets: 136 10*3/uL — ABNORMAL LOW (ref 150–400)
RBC: 1.85 MIL/uL — ABNORMAL LOW (ref 4.22–5.81)
RDW: 15.9 % — AB (ref 11.5–15.5)
WBC: 7.5 10*3/uL (ref 4.0–10.5)

## 2016-11-01 LAB — POC OCCULT BLOOD, ED: FECAL OCCULT BLD: POSITIVE — AB

## 2016-11-01 LAB — COMPREHENSIVE METABOLIC PANEL
ALK PHOS: 66 U/L (ref 38–126)
ALT: 15 U/L — ABNORMAL LOW (ref 17–63)
ANION GAP: 9 (ref 5–15)
AST: 18 U/L (ref 15–41)
Albumin: 2.7 g/dL — ABNORMAL LOW (ref 3.5–5.0)
BILIRUBIN TOTAL: 0.2 mg/dL — AB (ref 0.3–1.2)
BUN: 45 mg/dL — ABNORMAL HIGH (ref 6–20)
CALCIUM: 8.5 mg/dL — AB (ref 8.9–10.3)
CO2: 24 mmol/L (ref 22–32)
Chloride: 110 mmol/L (ref 101–111)
Creatinine, Ser: 1.8 mg/dL — ABNORMAL HIGH (ref 0.61–1.24)
GFR, EST AFRICAN AMERICAN: 40 mL/min — AB (ref >60)
GFR, EST NON AFRICAN AMERICAN: 34 mL/min — AB (ref >60)
Glucose, Bld: 127 mg/dL — ABNORMAL HIGH (ref 65–99)
POTASSIUM: 4.6 mmol/L (ref 3.5–5.1)
Sodium: 143 mmol/L (ref 135–145)
TOTAL PROTEIN: 4.6 g/dL — AB (ref 6.5–8.1)

## 2016-11-01 LAB — IRON AND TIBC
IRON: 115 ug/dL (ref 45–182)
SATURATION RATIOS: 63 % — AB (ref 17.9–39.5)
TIBC: 182 ug/dL — AB (ref 250–450)
UIBC: 67 ug/dL

## 2016-11-01 LAB — PREPARE RBC (CROSSMATCH)

## 2016-11-01 LAB — APTT: APTT: 28 s (ref 24–36)

## 2016-11-01 LAB — PROTIME-INR
INR: 1.09
PROTHROMBIN TIME: 14.2 s (ref 11.4–15.2)

## 2016-11-01 LAB — FERRITIN: FERRITIN: 588 ng/mL — AB (ref 24–336)

## 2016-11-01 LAB — POCT HEMOGLOBIN-HEMACUE: HEMOGLOBIN: 6.6 g/dL — AB (ref 13.0–17.0)

## 2016-11-01 MED ORDER — LORATADINE 10 MG PO TABS
10.0000 mg | ORAL_TABLET | Freq: Every day | ORAL | Status: DC
  Administered 2016-11-02 – 2016-11-04 (×3): 10 mg via ORAL
  Filled 2016-11-01 (×3): qty 1

## 2016-11-01 MED ORDER — TAMSULOSIN HCL 0.4 MG PO CAPS
0.4000 mg | ORAL_CAPSULE | Freq: Every day | ORAL | Status: DC
  Administered 2016-11-01 – 2016-11-03 (×3): 0.4 mg via ORAL
  Filled 2016-11-01 (×3): qty 1

## 2016-11-01 MED ORDER — FINASTERIDE 5 MG PO TABS
5.0000 mg | ORAL_TABLET | Freq: Every day | ORAL | Status: DC
  Administered 2016-11-02 – 2016-11-04 (×3): 5 mg via ORAL
  Filled 2016-11-01 (×3): qty 1

## 2016-11-01 MED ORDER — TRAMADOL HCL 50 MG PO TABS: 50.0000 mg | ORAL_TABLET | ORAL | Status: DC | PRN

## 2016-11-01 MED ORDER — ACETAMINOPHEN 650 MG RE SUPP: 650.0000 mg | Freq: Four times a day (QID) | RECTAL | Status: DC | PRN

## 2016-11-01 MED ORDER — SIMVASTATIN 20 MG PO TABS
20.0000 mg | ORAL_TABLET | Freq: Every day | ORAL | Status: DC
  Administered 2016-11-01 – 2016-11-03 (×3): 20 mg via ORAL
  Filled 2016-11-01 (×3): qty 1

## 2016-11-01 MED ORDER — CALCITRIOL 0.25 MCG PO CAPS
0.2500 ug | ORAL_CAPSULE | Freq: Every day | ORAL | Status: DC
  Administered 2016-11-02 – 2016-11-04 (×3): 0.25 ug via ORAL
  Filled 2016-11-01 (×3): qty 1

## 2016-11-01 MED ORDER — PREDNISONE 5 MG PO TABS
7.5000 mg | ORAL_TABLET | Freq: Every day | ORAL | Status: DC
  Administered 2016-11-02 – 2016-11-04 (×3): 7.5 mg via ORAL
  Filled 2016-11-01 (×3): qty 2

## 2016-11-01 MED ORDER — HYDROCODONE-ACETAMINOPHEN 5-325 MG PO TABS: 1.0000 | ORAL_TABLET | ORAL | Status: DC | PRN

## 2016-11-01 MED ORDER — EPOETIN ALFA 40000 UNIT/ML IJ SOLN
30000.0000 [IU] | INTRAMUSCULAR | Status: DC
Start: 2016-11-01 — End: 2016-11-02

## 2016-11-01 MED ORDER — ONDANSETRON HCL 4 MG/2ML IJ SOLN
4.0000 mg | Freq: Once | INTRAMUSCULAR | Status: AC
  Administered 2016-11-01: 4 mg via INTRAVENOUS
  Filled 2016-11-01: qty 2

## 2016-11-01 MED ORDER — SODIUM CHLORIDE 0.9% FLUSH
3.0000 mL | Freq: Two times a day (BID) | INTRAVENOUS | Status: DC
  Administered 2016-11-02 – 2016-11-03 (×2): 3 mL via INTRAVENOUS

## 2016-11-01 MED ORDER — ALBUTEROL SULFATE (2.5 MG/3ML) 0.083% IN NEBU: 3.0000 mL | INHALATION_SOLUTION | RESPIRATORY_TRACT | Status: DC | PRN

## 2016-11-01 MED ORDER — MOMETASONE FURO-FORMOTEROL FUM 200-5 MCG/ACT IN AERO
2.0000 | INHALATION_SPRAY | Freq: Two times a day (BID) | RESPIRATORY_TRACT | Status: DC
  Administered 2016-11-02 – 2016-11-04 (×4): 2 via RESPIRATORY_TRACT
  Filled 2016-11-01 (×2): qty 8.8

## 2016-11-01 MED ORDER — EPOETIN ALFA 20000 UNIT/ML IJ SOLN
INTRAMUSCULAR | Status: AC
  Administered 2016-11-01: 13:00:00 20000 [IU] via SUBCUTANEOUS
  Filled 2016-11-01: qty 1

## 2016-11-01 MED ORDER — SODIUM CHLORIDE 0.9 % IV SOLN
10.0000 mL/h | Freq: Once | INTRAVENOUS | Status: AC
  Administered 2016-11-01: 10 mL/h via INTRAVENOUS

## 2016-11-01 MED ORDER — CYCLOPHOSPHAMIDE 25 MG PO CAPS
25.0000 mg | ORAL_CAPSULE | Freq: Every day | ORAL | Status: DC
  Administered 2016-11-02 – 2016-11-03 (×2): 25 mg via ORAL
  Filled 2016-11-01 (×3): qty 1

## 2016-11-01 MED ORDER — ONDANSETRON HCL 4 MG PO TABS: 4.0000 mg | ORAL_TABLET | Freq: Four times a day (QID) | ORAL | Status: DC | PRN

## 2016-11-01 MED ORDER — B COMPLEX-C PO TABS
1.0000 | ORAL_TABLET | Freq: Every day | ORAL | Status: DC
  Administered 2016-11-02 – 2016-11-04 (×3): 1 via ORAL
  Filled 2016-11-01 (×3): qty 1

## 2016-11-01 MED ORDER — SALINE SPRAY 0.65 % NA SOLN: 1.0000 | NASAL | Status: DC | PRN

## 2016-11-01 MED ORDER — CLONIDINE HCL 0.1 MG PO TABS: 0.1000 mg | ORAL_TABLET | Freq: Once | ORAL | Status: AC | PRN

## 2016-11-01 MED ORDER — SODIUM CHLORIDE 0.9 % IV SOLN
80.0000 mg | Freq: Once | INTRAVENOUS | Status: AC
  Administered 2016-11-01: 16:00:00 80 mg via INTRAVENOUS
  Filled 2016-11-01: qty 80

## 2016-11-01 MED ORDER — ACETAMINOPHEN 325 MG PO TABS
650.0000 mg | ORAL_TABLET | Freq: Four times a day (QID) | ORAL | Status: DC | PRN
  Administered 2016-11-02: 650 mg via ORAL
  Filled 2016-11-01: qty 2

## 2016-11-01 MED ORDER — GUAIFENESIN-CODEINE 100-10 MG/5ML PO SOLN
10.0000 mL | Freq: Four times a day (QID) | ORAL | Status: DC | PRN
  Administered 2016-11-02 – 2016-11-03 (×3): 10 mL via ORAL
  Filled 2016-11-01 (×3): qty 10

## 2016-11-01 MED ORDER — B COMPLEX PO TABS: 1.0000 | ORAL_TABLET | Freq: Every day | ORAL | Status: DC

## 2016-11-01 MED ORDER — SODIUM CHLORIDE 0.9 % IV SOLN
INTRAVENOUS | Status: AC
  Administered 2016-11-01: 18:00:00 via INTRAVENOUS

## 2016-11-01 MED ORDER — DM-GUAIFENESIN ER 30-600 MG PO TB12
1.0000 | ORAL_TABLET | Freq: Two times a day (BID) | ORAL | Status: DC
  Administered 2016-11-01 – 2016-11-02 (×3): 1 via ORAL
  Filled 2016-11-01 (×3): qty 1

## 2016-11-01 MED ORDER — IPRATROPIUM-ALBUTEROL 0.5-2.5 (3) MG/3ML IN SOLN: 3.0000 mL | RESPIRATORY_TRACT | Status: DC | PRN

## 2016-11-01 MED ORDER — ONDANSETRON HCL 4 MG/2ML IJ SOLN: 4.0000 mg | Freq: Four times a day (QID) | INTRAMUSCULAR | Status: DC | PRN

## 2016-11-01 MED ORDER — BOOST / RESOURCE BREEZE PO LIQD
1.0000 | Freq: Three times a day (TID) | ORAL | Status: DC
Start: 2016-11-01 — End: 2016-11-02
  Administered 2016-11-01 – 2016-11-02 (×2): 1 via ORAL

## 2016-11-01 MED ORDER — EPOETIN ALFA 10000 UNIT/ML IJ SOLN
INTRAMUSCULAR | Status: AC
  Administered 2016-11-01: 13:00:00 10000 [IU] via SUBCUTANEOUS
  Filled 2016-11-01: qty 1

## 2016-11-01 MED ORDER — NITROGLYCERIN 0.4 MG SL SUBL: 0.4000 mg | SUBLINGUAL_TABLET | SUBLINGUAL | Status: DC | PRN

## 2016-11-01 MED ORDER — SODIUM CHLORIDE 0.9 % IV SOLN
8.0000 mg/h | INTRAVENOUS | Status: DC
  Administered 2016-11-01 – 2016-11-03 (×4): 8 mg/h via INTRAVENOUS
  Filled 2016-11-01 (×8): qty 80

## 2016-11-01 MED ORDER — SODIUM CHLORIDE 0.9 % IV BOLUS (SEPSIS)
1000.0000 mL | Freq: Once | INTRAVENOUS | Status: AC
  Administered 2016-11-01: 1000 mL via INTRAVENOUS

## 2016-11-01 MED ORDER — BISACODYL 5 MG PO TBEC
5.0000 mg | DELAYED_RELEASE_TABLET | Freq: Two times a day (BID) | ORAL | Status: DC
  Administered 2016-11-01 – 2016-11-04 (×5): 5 mg via ORAL
  Filled 2016-11-01 (×5): qty 1

## 2016-11-01 MED ORDER — SODIUM BICARBONATE 650 MG PO TABS
1300.0000 mg | ORAL_TABLET | Freq: Two times a day (BID) | ORAL | Status: DC
  Administered 2016-11-01 – 2016-11-04 (×6): 1300 mg via ORAL
  Filled 2016-11-01 (×6): qty 2

## 2016-11-01 NOTE — ED Notes (Signed)
Attempted report to 2W °

## 2016-11-01 NOTE — ED Triage Notes (Signed)
Pt presents with report of HGB of 6 from office upstairs.  Pt reports having black stools x 1 week, denies any rectal pain or abdominal pain.

## 2016-11-01 NOTE — ED Provider Notes (Signed)
Richard [name] DEPT Provider Note   CSN: [id number] Arrival date & time: 11/01/16  1302     History   Chief Complaint Chief Complaint  Patient presents with  . Rectal Bleeding    HPI Richard [name] is a 81 y.o. male presenting with 1 week of melena. He also reports having been recently more fatigued this past week and short of breath but this is baseline for him. He denies nausea, vomiting, abdominal pain, dysuria, hematuria,  HPI  Past Medical History:  Diagnosis Date  . Adenomatous colon polyp   . Anemia   . Anemia   . Asthma   . BPH (benign prostatic hyperplasia)    sees Dr. Risa Grill, biopsy June 2015 was benign   . Bronchiectasis ([place])   . CAD (coronary artery disease)    a. [place] s/p [place] x3 and ultimately BMS to pLAD& POBA to CTO of circumflex/marginal vessel on 03/17/14 and staged BMS to [facility] on 03/21/14  . Cataract   . Diverticulosis   . Femoral artery pseudo-aneurysm, right ([place])    a. s/p repair. Follow up with Dr. Kellie Simmering   . Fungal infection    lungs  . GERD (gastroesophageal reflux disease)   . Hiatal hernia   . HOH (hard of hearing)    bilaterally  . HTN (hypertension) 09/28/2013  . On home oxygen therapy    uses 2 l/m nasally at bedtime  . Peptic stricture of esophagus   . Wegener's granulomatosis ([place])    sees Dr. [name]     Patient Active Problem List   Diagnosis Date Noted  . Melena 11/01/2016  . Malnutrition of moderate degree 08/22/2016  . Influenza A 08/21/2016  . AVM (arteriovenous malformation) of colon with hemorrhage   . Symptomatic anemia 07/30/2016  . Gastrointestinal hemorrhage with melena 07/30/2016  . Aortic valve stenosis 07/08/2016  . Protein-calorie malnutrition, severe ([place]) 04/21/2015  . CAP (community acquired pneumonia) 04/18/2015  . Chronic kidney disease, stage IV (severe) ([place]) 03/26/2015  . Wegener's granulomatosis with renal involvement ([place]) 07/15/2014  . Pseudoaneurysm of right femoral artery ([place]) 04/08/2014  . CAD-  severe 4 V CAD- not CABG candidate 03/17/2014  . Anemia 03/17/2014  . Cardiomyopathy, ischemic- EF 40% by [facility] 03/17/2014  . Unstable Class III Angina 03/13/2014  . Abnormal nuclear stress test: Intermediate risk with moderate region of apical and inferior scar with mild superimposed ischemia; EF 40% 03/12/2014  . Loss of weight 12/12/2013  . Abdominal pain, unspecified site 12/12/2013  . SVT (supraventricular tachycardia) ([place]) 10/04/2013  . SOB (shortness of breath) 10/04/2013  . Weakness generalized 10/04/2013  . Hypertension 09/28/2013  . Chronic rhinitis 09/27/2013  . Chronic respiratory failure ([place]) 12/26/2012  . Bronchiectasis with acute exacerbation ([place]) 09/28/2011  . OTHER DYSPHAGIA 10/25/2010  . Wegener's granulomatosis ([place]) 05/10/2010  . GERD 03/10/2010  . ARTHRITIS, CERVICAL SPINE 03/10/2010  . Hypothyroidism 11/13/2008  . VITAMIN D DEFICIENCY 11/13/2008  . Hyperlipidemia 11/13/2008  . ANEMIA 11/13/2008  . BPH (benign prostatic hyperplasia) 12/04/2007  . Obstructive bronchiectasis ([place]) 01/25/2007    Past Surgical History:  Procedure Laterality Date  . CARDIAC CATHETERIZATION  03/17/2014   Procedure: CORONARY BALLOON ANGIOPLASTY;  Surgeon: Troy Sine, MD;  Location: [facility] CATH LAB;  Service: Cardiovascular;;  . CATARACT EXTRACTION, BILATERAL  12-18-12   bilateral  . CHOLECYSTECTOMY N/A 12/21/2012   Procedure: LAPAROSCOPIC CHOLECYSTECTOMY WITH INTRAOPERATIVE CHOLANGIOGRAM;  Surgeon: Edward Jolly, MD;  Location: [facility] ORS;  Service: General;  Laterality: N/A;  . COLONOSCOPY  08-16-11   per Dr. Fuller Plan, diverticulosis and polyps, repeat in 5 yrs   . COLONOSCOPY N/A 08/01/2016   Procedure: COLONOSCOPY;  Surgeon: Carol Ada, MD;  Location: [facility] ENDOSCOPY;  Service: Endoscopy;  Laterality: N/A;  . CORONARY ANGIOPLASTY  03/2014  . ESOPHAGOGASTRODUODENOSCOPY N/A 07/31/2016   Procedure: ESOPHAGOGASTRODUODENOSCOPY (EGD);  Surgeon: Carol Ada, MD;  Location: [facility]  ENDOS[facility];  Service: Endoscopy;  Laterality: N/A;  . ESOPHAGOGASTRODUODENOSCOPY (EGD) WITH ESOPHAGEAL DILATION  11-29-10   per Dr. Fuller Plan   . EYE SURGERY     cataracts removed.   Marland Kitchen HEMATOMA EVACUATION Right 03/19/2014   Procedure: Suture repair of femoral artery with evacuation of hematoma;  Surgeon: Mal Misty, MD;  Location: [place];  Service: Vascular;  Laterality: Right;  . HERNIA REPAIR    . LEFT HEART CATHETERIZATION WITH CORONARY ANGIOGRAM N/A 03/14/2014   Procedure: LEFT HEART CATHETERIZATION WITH CORONARY ANGIOGRAM;  Surgeon: Leonie Man, MD;  Location: [facility] CATH LAB;  Service: Cardiovascular;  Laterality: N/A;  . PERCUTANEOUS CORONARY STENT INTERVENTION (PCI-S) N/A 03/17/2014   Procedure: PERCUTANEOUS CORONARY STENT INTERVENTION (PCI-S);  Surgeon: Troy Sine, MD;  Location: [facility] CATH LAB;  Service: Cardiovascular;  Laterality: N/A;  . PERCUTANEOUS CORONARY STENT INTERVENTION (PCI-S) N/A 03/21/2014   Procedure: PERCUTANEOUS CORONARY STENT INTERVENTION (PCI-S);  Surgeon: Burnell Blanks, MD;  Location: [facility] CATH LAB;  Service: Cardiovascular;  Laterality: N/A;  . RENAL BIOPSY         Home Medications    Prior to Admission medications   Medication Sig Start Date End Date Taking? Authorizing Provider  Acetaminophen (TYLENOL) 325 MG CAPS Take 325 mg by mouth every 6 (six) hours as needed (pain). TAKE PER BOX AS NEEDED FOR PAIN    Yes Historical Provider, MD  albuterol (VENTOLIN HFA) 108 (90 Base) MCG/ACT inhaler Inhale 1-2 puffs into the lungs every 4 (four) hours as needed for wheezing or shortness of breath. 07/12/16  Yes Tanda Rockers, MD  amoxicillin (AMOXIL) 875 MG tablet Take 875 mg by mouth 2 (two) times daily. Maintenance med per MD   Yes Historical Provider, MD  amoxicillin-clavulanate (AUGMENTIN) 875-125 MG tablet Take 1 tablet by mouth 2 (two) times daily. 10/24/16  Yes Historical Provider, MD  b complex vitamins tablet Take 1 tablet by mouth daily.     Yes Historical  Provider, MD  bisacodyl (DULCOLAX) 5 MG EC tablet Take 5 mg by mouth 2 (two) times daily.    Yes Historical Provider, MD  calcitRIOL (ROCALTROL) 0.25 MCG capsule Take 0.25 mcg by mouth daily.  09/06/15  Yes Historical Provider, MD  clopidogrel (PLAVIX) 75 MG tablet Take 75 mg by mouth daily. 10/01/16  Yes Historical Provider, MD  cyclophosphamide (CYTOXAN) 25 MG capsule Take 25 mg by mouth daily. Take one capsule by mouth daily on an empty stomach 1 hour before or 2 hours after meal. 07/26/16  Yes Historical Provider, MD  dextromethorphan-guaiFENesin (MUCINEX DM) 30-600 MG 12hr tablet Take 1 tablet by mouth 2 (two) times daily.   Yes Historical Provider, MD  finasteride (PROSCAR) 5 MG tablet TAKE ONE TABLET BY MOUTH ONCE DAILY 08/29/16  Yes [name], MD  guaiFENesin-codeine 100-10 MG/5ML syrup Take 10 mLs by mouth every 6 (six) hours as needed for cough. 08/23/16  Yes Eber Jones, MD  ipratropium-albuterol (DUONEB) 0.5-2.5 (3) MG/3ML SOLN Take 3 mLs by nebulization every 4 (four) hours as needed (if inhaler is not effective in resolving shortness of breath or wheezing). Patient taking  differently: Take 3 mLs by nebulization every 4 (four) hours as needed (WITH FLUTTER VALVE FOR WHEEZING, SHORTNESS OF BREATH-PLAN C).  02/03/16  Yes Tanda Rockers, MD  loratadine (CLARITIN) 10 MG tablet Take 10 mg by mouth daily.    Yes Historical Provider, MD  Methylcellulose, Laxative, (CITRUCEL PO) Take 1 packet by mouth daily.   Yes Historical Provider, MD  metoprolol succinate (TOPROL-XL) 25 MG 24 hr tablet Take 1 tablet (25 mg total) by mouth daily. 07/18/16  Yes Pixie Casino, MD  nitroGLYCERIN (NITROSTAT) 0.4 MG SL tablet Place 1 tablet (0.4 mg total) under the tongue every 5 (five) minutes x 3 doses as needed for chest pain. 03/24/15  Yes Pixie Casino, MD  predniSONE (DELTASONE) 5 MG tablet Take 7.5 mg by mouth daily with breakfast.   Yes Historical Provider, MD  ranitidine (ZANTAC) 300 MG tablet  Take 300 mg by mouth 2 (two) times daily.   Yes Historical Provider, MD  simethicone (MYLICON) 846 MG chewable tablet Chew 125 mg by mouth every 6 (six) hours as needed for flatulence. TAKE PER BOX FOR GAS    Yes Historical Provider, MD  simvastatin (ZOCOR) 20 MG tablet TAKE ONE TABLET BY MOUTH ONCE DAILY AT  6  PM 07/07/16  Yes Pixie Casino, MD  sodium bicarbonate 650 MG tablet Take 2 tablets (1,300 mg total) by mouth 2 (two) times daily. NEED OV. 08/12/15  Yes Pixie Casino, MD  sodium chloride (OCEAN) 0.65 % SOLN nasal spray Place 1 spray into both nostrils as needed for congestion.   Yes Historical Provider, MD  SYMBICORT 160-4.5 MCG/ACT inhaler INHALE TWO PUFFS BY MOUTH TWICE DAILY 06/01/16  Yes Tanda Rockers, MD  tamsulosin (FLOMAX) 0.4 MG CAPS capsule TAKE ONE CAPSULE BY MOUTH ONCE DAILY IN THE EVENING 07/05/16  Yes [name], MD  traMADol (ULTRAM) 50 MG tablet 1-2 every 4 hours as needed for cough or pain Patient taking differently: Take 50 mg by mouth every 4 (four) hours as needed for moderate pain.  09/15/16  Yes Tanda Rockers, MD    Family History Family History  Problem Relation Age of Onset  . Asthma Father   . Coronary artery disease Mother   . Coronary artery disease Brother   . Coronary artery disease Brother   . Colon cancer Neg Hx   . Stomach cancer Neg Hx     Social History Social History  Substance Use Topics  . Smoking status: Never Smoker  . Smokeless tobacco: Never Used  . Alcohol use No     Allergies   Patient has no known allergies.   Review of Systems Review of Systems  Constitutional: Positive for fatigue. Negative for chills and fever.  HENT: Negative for congestion, sore throat and trouble swallowing.   Eyes: Negative for pain and visual disturbance.  Respiratory: Negative for cough, chest tightness, wheezing and stridor.   Cardiovascular: Negative for chest pain, palpitations and leg swelling.  Gastrointestinal: Negative for abdominal  distention, abdominal pain, nausea and vomiting.       Melena for one week, denies hematochezia  Genitourinary: Negative for difficulty urinating, dysuria, flank pain and hematuria.  Musculoskeletal: Negative for arthralgias, back pain, myalgias, neck pain and neck stiffness.  Skin: Negative for color change, pallor and rash.  Neurological: Negative for dizziness, seizures, syncope, speech difficulty, weakness, light-headedness and headaches.  All other systems reviewed and are negative.    Physical Exam Updated Vital Signs BP 115/73  Pulse (!) 112   Temp 98.8 F (37.1 C) (Oral)   Resp (!) 29   SpO2 100%   Physical Exam  Constitutional: No distress.  Afebrile, nontoxic-appearing, sitting comfortably in bed in no acute distress. He is speaking in full sentences  HENT:  Head: Normocephalic and atraumatic.  Mouth/Throat: Oropharynx is clear and moist.  Eyes: Conjunctivae and EOM are normal. No scleral icterus.  Neck: Normal range of motion. Neck supple.  Cardiovascular: Normal rate and regular rhythm.   No murmur heard. holosystolic murmur radiating to axilla  Pulmonary/Chest: Effort normal and breath sounds normal. No respiratory distress. He has no wheezes. He has no rales. He exhibits no tenderness.  Abdominal: Soft. Bowel sounds are normal. He exhibits no distension and no mass. There is no tenderness. There is no rebound and no guarding.  Musculoskeletal: He exhibits no edema.  Neurological: He is alert.  Skin: Skin is warm and dry. No rash noted. He is not diaphoretic. No erythema. No pallor.  Psychiatric: He has a normal mood and affect.  Nursing note and vitals reviewed.    ED Treatments / Results  Labs (all labs ordered are listed, but only abnormal results are displayed) Labs Reviewed  CBC WITH DIFFERENTIAL/PLATELET - Abnormal; Notable for the following:       Result Value   RBC 1.85 (*)    Hemoglobin 6.8 (*)    HCT 20.7 (*)    MCV 111.9 (*)    [name] 36.8 (*)      RDW 15.9 (*)    Platelets 136 (*)    Lymphs Abs 0.4 (*)    All other components within normal limits  COMPREHENSIVE METABOLIC PANEL - Abnormal; Notable for the following:    Glucose, Bld 127 (*)    BUN 45 (*)    Creatinine, Ser 1.80 (*)    Calcium 8.5 (*)    Total Protein 4.6 (*)    Albumin 2.7 (*)    ALT 15 (*)    Total Bilirubin 0.2 (*)    GFR calc non Af Amer 34 (*)    GFR calc Af Amer 40 (*)    All other components within normal limits  POC OCCULT BLOOD, ED - Abnormal; Notable for the following:    Fecal Occult Bld POSITIVE (*)    All other components within normal limits  PROTIME-INR  APTT  CBC  BASIC METABOLIC PANEL  CBC  TYPE AND SCREEN  PREPARE RBC (CROSSMATCH)   BUN  Date Value Ref Range Status  11/01/2016 45 (H) 6 - 20 mg/dL Final  10/18/2016 29 (H) 6 - 20 mg/dL Final  09/15/2016 34 (H) 6 - 23 mg/dL Final  08/23/2016 29 (H) 6 - 20 mg/dL Final   Creat  Date Value Ref Range Status  03/24/2015 1.92 (H) 0.70 - 1.18 mg/dL Final  05/06/2014 1.58 (H) 0.50 - 1.35 mg/dL Final  04/15/2014 1.61 (H) 0.50 - 1.35 mg/dL Final  04/09/2014 1.70 (H) 0.50 - 1.35 mg/dL Final   Creatinine, Ser  Date Value Ref Range Status  11/01/2016 1.80 (H) 0.61 - 1.24 mg/dL Final  10/18/2016 2.21 (H) 0.61 - 1.24 mg/dL Final  09/15/2016 2.28 (H) 0.40 - 1.50 mg/dL Final  08/23/2016 1.82 (H) 0.61 - 1.24 mg/dL Final   Hemoglobin  Date Value Ref Range Status  11/01/2016 6.8 (LL) 13.0 - 17.0 g/dL Final    Comment:    REPEATED TO VERIFY CRITICAL RESULT CALLED TO, READ BACK BY AND VERIFIED WITH: Raelyn Ensign RN  1359 11/01/2016 BY MACEDA, J   11/01/2016 6.6 (LL) 13.0 - 17.0 g/dL Final  10/18/2016 11.7 (L) 13.0 - 17.0 g/dL Final  10/18/2016 12.1 (L) 13.0 - 17.0 g/dL Final       EKG  EKG Interpretation None       Radiology No results found.  Procedures Procedures (including critical care time)  CRITICAL CARE Performed by: Emeline General   Total critical care time:  30 minutes  Critical care time was exclusive of separately billable procedures and treating other patients.  Critical care was necessary to treat or prevent imminent or life-threatening deterioration.  Critical care was time spent personally by me on the following activities: development of treatment plan with patient and/or surrogate as well as nursing, discussions with consultants, evaluation of patient's response to treatment, examination of patient, obtaining history from patient or surrogate, ordering and performing treatments and interventions, ordering and review of laboratory studies, ordering and review of radiographic studies, pulse oximetry and re-evaluation of patient's condition.    Medications Ordered in ED Medications  pantoprazole (PROTONIX) 80 mg in sodium chloride 0.9 % 250 mL (0.32 mg/mL) infusion (not administered)  traMADol (ULTRAM) tablet 50 mg (not administered)  finasteride (PROSCAR) tablet 5 mg (not administered)  guaiFENesin-codeine 100-10 MG/5ML solution 10 mL (not administered)  dextromethorphan-guaiFENesin (MUCINEX DM) 30-600 MG per 12 hr tablet 1 tablet (not administered)  predniSONE (DELTASONE) tablet 7.5 mg (not administered)  cyclophosphamide (CYTOXAN) capsule 25 mg (not administered)  sodium chloride (OCEAN) 0.65 % nasal spray 1 spray (not administered)  albuterol (PROVENTIL) (2.5 MG/3ML) 0.083% nebulizer solution 3 mL (not administered)  simvastatin (ZOCOR) tablet 20 mg (not administered)  tamsulosin (FLOMAX) capsule 0.4 mg (not administered)  mometasone-formoterol (DULERA) 200-5 MCG/ACT inhaler 2 puff (not administered)  ipratropium-albuterol (DUONEB) 0.5-2.5 (3) MG/3ML nebulizer solution 3 mL (not administered)  calcitRIOL (ROCALTROL) capsule 0.25 mcg (not administered)  sodium bicarbonate tablet 1,300 mg (not administered)  nitroGLYCERIN (NITROSTAT) SL tablet 0.4 mg (not administered)  loratadine (CLARITIN) tablet 10 mg (not administered)  bisacodyl  (DULCOLAX) EC tablet 5 mg (not administered)  sodium chloride flush (NS) 0.9 % injection 3 mL (not administered)  acetaminophen (TYLENOL) tablet 650 mg (not administered)    Or  acetaminophen (TYLENOL) suppository 650 mg (not administered)  HYDROcodone-acetaminophen (NORCO/VICODIN) 5-325 MG per tablet 1-2 tablet (not administered)  ondansetron (ZOFRAN) tablet 4 mg (not administered)    Or  ondansetron (ZOFRAN) injection 4 mg (not administered)  0.9 %  sodium chloride infusion (not administered)  B-complex with vitamin C tablet 1 tablet (not administered)  sodium chloride 0.9 % bolus 1,000 mL (1,000 mLs Intravenous New Bag/Given 11/01/16 1436)  0.9 %  sodium chloride infusion (10 mL/hr Intravenous New Bag/Given 11/01/16 1500)  pantoprazole (PROTONIX) 80 mg in sodium chloride 0.9 % 100 mL IVPB (80 mg Intravenous New Bag/Given 11/01/16 1611)  ondansetron (ZOFRAN) injection 4 mg (4 mg Intravenous Given 11/01/16 1637)     Initial Impression / Assessment and Plan / ED Course  I have reviewed the triage vital signs and the nursing notes.  Pertinent labs & imaging results that were available during my care of the patient were reviewed by me and considered in my medical decision making (see chart for details).     Patient presents with 1 week of melena and hemoglobin of 6.8 sent down from outpatient office.  Given IV fluids and packed red blood cells. Stable vitals. Patient is well-appearing and doesn't have any complaints at this time.  Consulted   GI to let them know about the patient and they will be seeing him once he is admitted. Spoke to admitting hospitalist will be admitting patient  On reassessment he was comfortable, vitals still stable. He is very pleasant and talkative. Asking when he may be able to eat.  Final Clinical Impressions(s) / ED Diagnoses   Final diagnoses:  Gastrointestinal hemorrhage with melena    New Prescriptions New Prescriptions   No medications on file      Emeline General, Hershal Coria 11/01/16 1746    Gareth Morgan, MD 11/05/16 [phone]

## 2016-11-01 NOTE — ED Notes (Signed)
Portable CXR completed prior to transport.

## 2016-11-01 NOTE — Progress Notes (Signed)
Pt pale, very short of breath, states he is seeing black stools.  Hemocue today 6.6.  Sent pt to the ER to be evaluated and called and reported all of the above to [place] at [facility]

## 2016-11-01 NOTE — ED Notes (Signed)
Critical Hgb 6.8 reported to Dr. Billy Fischer, will continue to work to place pt in treatment room as soon as possible.

## 2016-11-01 NOTE — Consult Note (Signed)
[facility] Consult: 3:52 PM 11/01/2016  LOS: 0 days    Referring Provider: Dr Billy Fischer  Primary Care Physician:  Alysia Penna, MD Primary Gastroenterologist:  Dr. Fuller Plan    Reason for Consultation:  Melenic stool and Hgb 6.8   HPI: Richard [name] is a 81 y.o. male.  PMH Wegener's granulomatosis. Chronic resp failure. CKD 4. Severe 4 vsl CAD.  Ischemic CM.  EF 40%.  HTN.   CAD.  03/2014 cardiac stent.  Anemia due to GI blood loss 07/2016 and CKD, on Epogen.  Thrombocytopenia.  + flu test 08/21/16.  Hx adenomatous colon polyps and peptic esophageal strictures.  GI bleed in setting of Plavix 07/2016.   07/31/16 EGD.  For IDA, anemia, FOBT +.  Benign esophageal stenosis.  5 cm HH.  o/w normal study.  08/01/16 Colonoscopy.  For Melena, Dr Benson Norway. Two 3 to 4 mm polyps at the hepatic flexure and cecum, removed with a cold snare.  20 mm polyp at the hepatic flexure, removed piecemeal using injection-lift and hot snare. Clips (MR unsafe) placed. Tattooed.  A single non-bleeding colonic angiodysplastic lesion. Treated with a monopolar probe. Diverticulosis in the entire examined colon.  Path: all tubular adenomas.  Resumed Plavix 5 days post discharge and ASA in early Feb 2018.  Hgb 10 in late 08/2016. Had iron infusion per Dr Deterding in early 09/2016.  On weekly Epogen.  Hgb 2 weeks ago 12.1 so no Epogen.  A week ago for globin was adequate, exact numbers are not known, but he didn't need Epogen   Patient had purulent type productive cough and completed a course of amoxicillin just a few days ago. His cough is less purulent but still productive. DOE is stable. For several days he's had progressively darker stools. For 36 hours the stools have been black. They are always formed. He has 1 or 2 in the morning which is his usual  routine.  No abdominal pain. Some dizziness, weakness but no chest pain for a day or 2. He has generalized anorexia and periodic nausea without vomiting but that has been present ever since starting on Cytoxan for treatment of his Wegener's in January 2017. He estimates weight loss of 5-10 pounds in the last 6 months. Denies swelling.  His last dose of 81 mg ASA was this morning. His last dose of Plavix was yesterday. 4 stomach acid control he uses ranitidine 300 mg twice daily.  Dr Deterding stopped Nexium paying it was bad for his kidneys. Came into the ED today because of the black stools and weakness. Stools are black and rapidly FOBT positive. Hgb is 6.8.  MCV 111.  Platelets 136.  Normal coags.     Past Medical History:  Diagnosis Date  . Adenomatous colon polyp   . Anemia   . Asthma   . BPH (benign prostatic hyperplasia)    sees Dr. Risa Grill, biopsy June 2015 was benign   . Bronchiectasis ([place])   . CAD (coronary artery disease)    a. [place] s/p [place] x3 and ultimately BMS to pLAD&  POBA to CTO of circumflex/marginal vessel on 03/17/14 and staged BMS to mRCA on 03/21/14  . Cataract   . Diverticulosis   . Femoral artery pseudo-aneurysm, right ([place])    a. s/p repair. Follow up with Dr. Kellie Simmering   . Fungal infection    lungs  . GERD (gastroesophageal reflux disease)   . Hiatal hernia   . HOH (hard of hearing)    bilaterally  . HTN (hypertension) 09/28/2013  . On home oxygen therapy    uses 2 l/m nasally at bedtime  . Peptic stricture of esophagus   . Wegener's granulomatosis ([place])    sees Dr. [name]     Past Surgical History:  Procedure Laterality Date  . CARDIAC CATHETERIZATION  03/17/2014   Procedure: CORONARY BALLOON ANGIOPLASTY;  Surgeon: Troy Sine, MD;  Location[facility] CATH LAB;  Service: Cardiovascular;;  . CATARACT EXTRACTION, BILATERAL  12-18-12   bilateral  . CHOLECYSTECTOMY N/A 12/21/2012   Procedure: LAPAROSCOPIC CHOLECYSTECTOMY WITH INTRAOPERATIVE CHOLANGIOGRAM;  Surgeon:  Edward Jolly, MD;  Location: [facility] ORS;  Service: General;  Laterality: N/A;  . COLONOSCOPY  08-16-11   per Dr. Fuller Plan, diverticulosis and polyps, repeat in 5 yrs   . COLONOSCOPY N/A 08/01/2016   Procedure: COLONOSCOPY;  Surgeon: Carol Ada, MD;  Location: [facility] ENDOSCOPY;  Service: Endoscopy;  Laterality: N/A;  . CORONARY ANGIOPLASTY  03/2014  . ESOPHAGOGASTRODUODENOSCOPY N/A 07/31/2016   Procedure: ESOPHAGOGASTRODUODENOSCOPY (EGD);  Surgeon: Carol Ada, MD;  Location: [facility];  Service: Endoscopy;  Laterality: N/A;  . ESOPHAGOGASTRODUODENOSCOPY (EGD) WITH ESOPHAGEAL DILATION  11-29-10   per Dr. Fuller Plan   . EYE SURGERY     cataracts removed.   Marland Kitchen HEMATOMA EVACUATION Right 03/19/2014   Procedure: Suture repair of femoral artery with evacuation of hematoma;  Surgeon: Mal Misty, MD;  Location: [facility];  Service: Vascular;  Laterality: Right;  . HERNIA REPAIR    . LEFT HEART CATHETERIZATION WITH CORONARY ANGIOGRAM N/A 03/14/2014   Procedure: LEFT HEART CATHETERIZATION WITH CORONARY ANGIOGRAM;  Surgeon: Leonie Man, MD;  Location[facility] CATH LAB;  Service: Cardiovascular;  Laterality: N/A;  . PERCUTANEOUS CORONARY STENT INTERVENTION (PCI-S) N/A 03/17/2014   Procedure: PERCUTANEOUS CORONARY STENT INTERVENTION (PCI-S);  Surgeon: Troy Sine, MD;  Location: [facility] CATH LAB;  Service: Cardiovascular;  Laterality: N/A;  . PERCUTANEOUS CORONARY STENT INTERVENTION (PCI-S) N/A 03/21/2014   Procedure: PERCUTANEOUS CORONARY STENT INTERVENTION (PCI-S);  Surgeon: Burnell Blanks, MD;  Location: [facility] CATH LAB;  Service: Cardiovascular;  Laterality: N/A;  . RENAL BIOPSY      Prior to Admission medications   Medication Sig Start Date End Date Taking? Authorizing Provider  Acetaminophen (TYLENOL) 325 MG CAPS Take 325 mg by mouth every 6 (six) hours as needed (pain). TAKE PER BOX AS NEEDED FOR PAIN    Yes Historical Provider, MD  albuterol (VENTOLIN HFA) 108 (90 Base) MCG/ACT inhaler Inhale 1-2 puffs into  the lungs every 4 (four) hours as needed for wheezing or shortness of breath. 07/12/16  Yes Tanda Rockers, MD  amoxicillin (AMOXIL) 875 MG tablet Take 875 mg by mouth 2 (two) times daily. Maintenance med per MD   Yes Historical Provider, MD  amoxicillin-clavulanate (AUGMENTIN) 875-125 MG tablet Take 1 tablet by mouth 2 (two) times daily. 10/24/16  Yes Historical Provider, MD  b complex vitamins tablet Take 1 tablet by mouth daily.     Yes Historical Provider, MD  bisacodyl (DULCOLAX) 5 MG EC tablet Take 5 mg by mouth 2 (two)  times daily.    Yes Historical Provider, MD  calcitRIOL (ROCALTROL) 0.25 MCG capsule Take 0.25 mcg by mouth daily.  09/06/15  Yes Historical Provider, MD  clopidogrel (PLAVIX) 75 MG tablet Take 75 mg by mouth daily. 10/01/16  Yes Historical Provider, MD  cyclophosphamide (CYTOXAN) 25 MG capsule Take 25 mg by mouth daily. Take one capsule by mouth daily on an empty stomach 1 hour before or 2 hours after meal. 07/26/16  Yes Historical Provider, MD  dextromethorphan-guaiFENesin (MUCINEX DM) 30-600 MG 12hr tablet Take 1 tablet by mouth 2 (two) times daily.   Yes Historical Provider, MD  finasteride (PROSCAR) 5 MG tablet TAKE ONE TABLET BY MOUTH ONCE DAILY 08/29/16  Yes [name], MD  guaiFENesin-codeine 100-10 MG/5ML syrup Take 10 mLs by mouth every 6 (six) hours as needed for cough. 08/23/16  Yes Eber Jones, MD  ipratropium-albuterol (DUONEB) 0.5-2.5 (3) MG/3ML SOLN Take 3 mLs by nebulization every 4 (four) hours as needed (if inhaler is not effective in resolving shortness of breath or wheezing). Patient taking differently: Take 3 mLs by nebulization every 4 (four) hours as needed (WITH FLUTTER VALVE FOR WHEEZING, SHORTNESS OF BREATH-PLAN C).  02/03/16  Yes Tanda Rockers, MD  loratadine (CLARITIN) 10 MG tablet Take 10 mg by mouth daily.    Yes Historical Provider, MD  Methylcellulose, Laxative, (CITRUCEL PO) Take 1 packet by mouth daily.   Yes Historical Provider, MD    metoprolol succinate (TOPROL-XL) 25 MG 24 hr tablet Take 1 tablet (25 mg total) by mouth daily. 07/18/16  Yes Pixie Casino, MD  nitroGLYCERIN (NITROSTAT) 0.4 MG SL tablet Place 1 tablet (0.4 mg total) under the tongue every 5 (five) minutes x 3 doses as needed for chest pain. 03/24/15  Yes Pixie Casino, MD  predniSONE (DELTASONE) 5 MG tablet Take 7.5 mg by mouth daily with breakfast.   Yes Historical Provider, MD  ranitidine (ZANTAC) 300 MG tablet Take 300 mg by mouth 2 (two) times daily.   Yes Historical Provider, MD  simethicone (MYLICON) 093 MG chewable tablet Chew 125 mg by mouth every 6 (six) hours as needed for flatulence. TAKE PER BOX FOR GAS    Yes Historical Provider, MD  simvastatin (ZOCOR) 20 MG tablet TAKE ONE TABLET BY MOUTH ONCE DAILY AT  6  PM 07/07/16  Yes Pixie Casino, MD  sodium bicarbonate 650 MG tablet Take 2 tablets (1,300 mg total) by mouth 2 (two) times daily. NEED OV. 08/12/15  Yes Pixie Casino, MD  sodium chloride (OCEAN) 0.65 % SOLN nasal spray Place 1 spray into both nostrils as needed for congestion.   Yes Historical Provider, MD  SYMBICORT 160-4.5 MCG/ACT inhaler INHALE TWO PUFFS BY MOUTH TWICE DAILY 06/01/16  Yes Tanda Rockers, MD  tamsulosin (FLOMAX) 0.4 MG CAPS capsule TAKE ONE CAPSULE BY MOUTH ONCE DAILY IN THE EVENING 07/05/16  Yes [name], MD  traMADol (ULTRAM) 50 MG tablet 1-2 every 4 hours as needed for cough or pain Patient taking differently: Take 50 mg by mouth every 4 (four) hours as needed for moderate pain.  09/15/16  Yes Tanda Rockers, MD  HYDROcodone-homatropine (HYDROMET) 5-1.5 MG/5ML syrup Take 5 mLs by mouth every 6 (six) hours as needed. Patient not taking: Reported on 11/01/2016 10/06/16   Tammy S Parrett, NP  predniSONE (DELTASONE) 10 MG tablet Take  4 each am x 2 days,   2 each am x 2 days,  1 each am x  2 days and stop Patient not taking: Reported on 10/18/2016 09/15/16   Tanda Rockers, MD  predniSONE (DELTASONE) 20 MG tablet Take 2  tablets (40 mg total) by mouth daily. 2 tabs (40mg ) for 3 days then 1 tab (20mg ) for 2 days then 1/2 tab (10mg ) for 2 days then start back on your normal prednisone Patient not taking: Reported on 11/01/2016 10/18/16   Blanchie Dessert, MD    Scheduled Meds:  Infusions: . pantoprazole (PROTONIX) IV    . pantoprozole (PROTONIX) infusion     PRN Meds:    Allergies as of 11/01/2016  . (No Known Allergies)    Family History  Problem Relation Age of Onset  . Asthma Father   . Coronary artery disease Mother   . Coronary artery disease Brother   . Coronary artery disease Brother   . Colon cancer Neg Hx   . Stomach cancer Neg Hx     Social History   Social History  . Marital status: Married    Spouse name: N/A  . Number of children: N/A  . Years of education: N/A   Occupational History  . Retired    Social History Main Topics  . Smoking status: Never Smoker  . Smokeless tobacco: Never Used  . Alcohol use No  . Drug use: No  . Sexual activity: Yes   Other Topics Concern  . Not on file   Social History Narrative  . No narrative on file    REVIEW OF SYSTEMS: Constitutional:  Weakness over the past couple of days. Generally mobile without a wheelchair or walker but he doesn't walk around that much. ENT:  Last week when he blew his nose, he saw some blood but this was not a dripping/ copious epistaxis.dPulm:  Per HPI CV:  No palpitations, no LE edema.  GU:  No hematuria, no frequency GI:  Per HPI.  No dysphagia Heme:  Develops purpura easily and since starting on Plavix repeated purpura has little lead to permanent heme staining in his lower arms  Transfusions:  Received PRBC times 2 in 07/2016. Neuro:  No headaches, no peripheral tingling or numbness Derm:  No itching, no rash or sores.  Endocrine:  No sweats or chills.  No polyuria or dysuria Immunization:  Not inquire as to recent vaccinations. Travel:  None beyond local counties in last few months.    PHYSICAL  EXAM: Vital signs in last 24 hours: Vitals:   11/01/16 1530 11/01/16 1545  BP: 112/69 127/65  Pulse: 96 89  Resp: 16 17  Temp:     Wt Readings from Last 3 Encounters:  10/18/16 53.1 kg (117 lb)  10/06/16 53.2 kg (117 lb 3.2 oz)  10/04/16 54.4 kg (120 lb)    General: Alert, chronically ill, frail appearing WM.  comfortable Head:  No asymmetry or swelling. No signs of head trauma  Eyes:  No scleral icterus. No conjunctival pallor. EOMI. Ears:  Slightly HOH  Nose:  No congestion or discharge. Mouth:  Extensive dental hardware but teeth are in good repair with partial plates and bridges Neck:  No JVD, no masses. Lungs:  Patient has a mucoid cough. His lungs though are clear bilaterally with no adventitious sounds. Heart: RRR with harsh murmur. S1, S2 present. Abdomen:  Soft. Not tender or distended. No masses. No tenderness. Active bowel sounds..   Rectal: Performed by the ED physician and stool black and very FOBT positive   Musc/Skeltl: No joint erythema or swelling or gross deformities  Extremities:  No CCE. Musculature in the limbs is moderately wasted  Neurologic:  Patient is alert. He is oriented times 3. He moves all 4 limbs. Able to sit up in the stretcher without assistance. Limb strength was not tested. No tremors or gross deficits. Skin:  Purpura covering the forearms bilaterally. Nodes:  No cervical adenopathy   Psych:  Pleasant, calm, cooperative.  Intake/Output from previous day: No intake/output data recorded. Intake/Output this shift: No intake/output data recorded.  LAB RESULTS:  Recent Labs  11/01/16 1249 11/01/16 1315  WBC  --  7.5  HGB 6.6* 6.8*  HCT  --  20.7*  PLT  --  136*   BMET Lab Results  Component Value Date   NA 143 11/01/2016   NA 141 10/18/2016   NA 140 09/15/2016   K 4.6 11/01/2016   K 4.2 10/18/2016   K 4.6 09/15/2016   CL 110 11/01/2016   CL 105 10/18/2016   CL 105 09/15/2016   CO2 24 11/01/2016   CO2 24 10/18/2016   CO2 27  09/15/2016   GLUCOSE 127 (H) 11/01/2016   GLUCOSE 119 (H) 10/18/2016   GLUCOSE 119 (H) 09/15/2016   BUN 45 (H) 11/01/2016   BUN 29 (H) 10/18/2016   BUN 34 (H) 09/15/2016   CREATININE 1.80 (H) 11/01/2016   CREATININE 2.21 (H) 10/18/2016   CREATININE 2.28 (H) 09/15/2016   CALCIUM 8.5 (L) 11/01/2016   CALCIUM 8.8 (L) 10/18/2016   CALCIUM 9.1 09/15/2016   LFT  Recent Labs  11/01/16 1315  PROT 4.6*  ALBUMIN 2.7*  AST 18  ALT 15*  ALKPHOS 66  BILITOT 0.2*   PT/INR Lab Results  Component Value Date   INR 1.09 11/01/2016   INR 0.99 10/18/2016   INR 1.71 08/22/2016   Hepatitis Panel No results for input(s): HEPBSAG, HCVAB, HEPAIGM, HEPBIGM in the last 72 hours. C-Diff No components found for: CDIFF Lipase     Component Value Date/Time   LIPASE 41 04/25/2010 0740    Drugs of Abuse  No results found for: LABOPIA, COCAINSCRNUR, LABBENZ, AMPHETMU, THCU, LABBARB   RADIOLOGY STUDIES: No results found.    IMPRESSION:   *  GI bleed with melenic type stools. Presentation quite similar to that observed in December 2017.  Findings at that time included adenomatous colon polyps one of which was large and piecemeal resected,clipped and tattooed. A nonbleeding AVM was treated with cautery. Pan diverticulosis noted. Suspect either a right sided diverticular bleed, bleeding from AVMs, perhaps in the small intestine. EGD in 07/2016 revealed esophageal stenosis and hiatal hernia.  *  Chronic Plavix and aspirin for 4 vessel CAD and previous stent 2015  *  Acute blood loss anemia with underlying background of anemia of chronic kidney disease. However, according to pt, 2 weeks ago Hgb 12.1 and he has not required weekly Neupogen for the past couple of weeks.  *  Chronic respiratory disease. Recently completed about 8 days of amoxicillin for bronchitis flare.  *  Wegener's granulomatosis.    PLAN:     *  Question plan to repeat colonoscopy, if so question timing of this as he has  had Plavix as recently as the evening of 10/31/16.  *  Clear liquids. Does not need proton pump infusion.  Transfusion with packed red cells ordered and the first of these is infusing now   Azucena Freed  11/01/2016, 3:52 PM Pager: [phone]       Attending physician's note   I have  taken a history, examined the patient and reviewed the chart. I agree with the Advanced Practitioner's note, impression and recommendations. Recurrent melena with ABL anemia - very similar presentation to 07/2016 admission and bleed at that time was felt secondary to colonic AVMs or colonic diverticulosis. Consider repeat colonoscopy however pt is higher risk for complications given his comorbidities. Transfuse to Hb > 8. Trend CBC. He is maintained on Plavix and ASA which are currently being held. Seriously consider if Plavix can be permanently discontinued-primary service and Cardiology will need to weight in/provide input.   Lucio Edward, MD [facility] [phone] Mon-Fri 8a-5p [phone] after 5p, weekends, holidays

## 2016-11-01 NOTE — Progress Notes (Signed)
Patient arrived to 2W room 17 from the ED.  Telemetry monitor was applied and CCMD notified.  Patient oriented to unit and room to include phone and call light.  Will continue to monitor.

## 2016-11-01 NOTE — H&P (Signed)
History and Physical    [name]:[id number] DOB: July 15, 1936 DOA: 11/01/2016  PCP: Alysia Penna, MD Patient coming from: home  Chief Complaint: melena/worsening sob/weakness  HPI: Richard [name] is a very pleasant 81 y.o. male with medical history significant for ischemic cardiomyopathy, hypertension, CAD status post stents, Wegener's granulomatosis, chronic kidney disease stage IV, chronic respiratory failure, obstructive bronchiectasis since to the emergency department with chief complaint of worsening melena generalized weakness. Initial evaluation reveals a hemoglobin of 6.8.  Information is obtained from the patient and his wife is at the bedside. He reports gradual worsening of melena over the last week. He reports black stool over the last 24 hours. Denies diarrhea. He denies abdominal pain nausea vomiting. Associated symptoms include worsening dyspnea on exertion increased cough and generalized weakness. He denies chest pain palpitation headache dizziness syncope or near-syncope. He also endorses some decreased oral intake. He reports compliance with his 81 mg of aspirin every day and Plavix. He reports taking both today before presentation. He denies fever chills dysuria hematuria frequency or urgency.   ED Course: He is provided with 2 units of packed red blood cells. He's afebrile hemodynamically stable and not hypoxic.  Review of Systems: As per HPI otherwise 10 point review of systems negative.   Ambulatory Status: Ambulates independently. Denies any recent falling.  Past Medical History:  Diagnosis Date  . Adenomatous colon polyp   . Anemia   . Anemia   . Asthma   . BPH (benign prostatic hyperplasia)    sees Dr. Risa Grill, biopsy June 2015 was benign   . Bronchiectasis ([place])   . CAD (coronary artery disease)    a. [place] s/p [place] x3 and ultimately BMS to pLAD& POBA to CTO of circumflex/marginal vessel on 03/17/14 and staged BMS to [facility] on 03/21/14  . Cataract   .  Diverticulosis   . Femoral artery pseudo-aneurysm, right ([place])    a. s/p repair. Follow up with Dr. Kellie Simmering   . Fungal infection    lungs  . GERD (gastroesophageal reflux disease)   . Hiatal hernia   . HOH (hard of hearing)    bilaterally  . HTN (hypertension) 09/28/2013  . On home oxygen therapy    uses 2 l/m nasally at bedtime  . Peptic stricture of esophagus   . Wegener's granulomatosis ([place])    sees Dr. [name]     Past Surgical History:  Procedure Laterality Date  . CARDIAC CATHETERIZATION  03/17/2014   Procedure: CORONARY BALLOON ANGIOPLASTY;  Surgeon: Troy Sine, MD;  Location: [facility] CATH LAB;  Service: Cardiovascular;;  . CATARACT EXTRACTION, BILATERAL  12-18-12   bilateral  . CHOLECYSTECTOMY N/A 12/21/2012   Procedure: LAPAROSCOPIC CHOLECYSTECTOMY WITH INTRAOPERATIVE CHOLANGIOGRAM;  Surgeon: Edward Jolly, MD;  Location: [facility] ORS;  Service: General;  Laterality: N/A;  . COLONOSCOPY  08-16-11   per Dr. Fuller Plan, diverticulosis and polyps, repeat in 5 yrs   . COLONOSCOPY N/A 08/01/2016   Procedure: COLONOSCOPY;  Surgeon: Carol Ada, MD;  Location: [facility] ENDOSCOPY;  Service: Endoscopy;  Laterality: N/A;  . CORONARY ANGIOPLASTY  03/2014  . ESOPHAGOGASTRODUODENOSCOPY N/A 07/31/2016   Procedure: ESOPHAGOGASTRODUODENOSCOPY (EGD);  Surgeon: Carol Ada, MD;  Location: [facility];  Service: Endoscopy;  Laterality: N/A;  . ESOPHAGOGASTRODUODENOSCOPY (EGD) WITH ESOPHAGEAL DILATION  11-29-10   per Dr. Fuller Plan   . EYE SURGERY     cataracts removed.   Marland Kitchen HEMATOMA EVACUATION Right 03/19/2014   Procedure: Suture repair of femoral artery with evacuation of hematoma;  Surgeon: Mal Misty, MD;  Location: [place];  Service: Vascular;  Laterality: Right;  . HERNIA REPAIR    . LEFT HEART CATHETERIZATION WITH CORONARY ANGIOGRAM N/A 03/14/2014   Procedure: LEFT HEART CATHETERIZATION WITH CORONARY ANGIOGRAM;  Surgeon: Leonie Man, MD;  Location: [facility] CATH LAB;  Service: Cardiovascular;  Laterality:  N/A;  . PERCUTANEOUS CORONARY STENT INTERVENTION (PCI-S) N/A 03/17/2014   Procedure: PERCUTANEOUS CORONARY STENT INTERVENTION (PCI-S);  Surgeon: Troy Sine, MD;  Location: [facility] CATH LAB;  Service: Cardiovascular;  Laterality: N/A;  . PERCUTANEOUS CORONARY STENT INTERVENTION (PCI-S) N/A 03/21/2014   Procedure: PERCUTANEOUS CORONARY STENT INTERVENTION (PCI-S);  Surgeon: Burnell Blanks, MD;  Location: [facility] CATH LAB;  Service: Cardiovascular;  Laterality: N/A;  . RENAL BIOPSY      Social History   Social History  . Marital status: Married    Spouse name: N/A  . Number of children: N/A  . Years of education: N/A   Occupational History  . Retired    Social History Main Topics  . Smoking status: Never Smoker  . Smokeless tobacco: Never Used  . Alcohol use No  . Drug use: No  . Sexual activity: Yes   Other Topics Concern  . Not on file   Social History Narrative  . No narrative on file    No Known Allergies  Family History  Problem Relation Age of Onset  . Asthma Father   . Coronary artery disease Mother   . Coronary artery disease Brother   . Coronary artery disease Brother   . Colon cancer Neg Hx   . Stomach cancer Neg Hx     Prior to Admission medications   Medication Sig Start Date End Date Taking? Authorizing Provider  Acetaminophen (TYLENOL) 325 MG CAPS Take 325 mg by mouth every 6 (six) hours as needed (pain). TAKE PER BOX AS NEEDED FOR PAIN    Yes Historical Provider, MD  albuterol (VENTOLIN HFA) 108 (90 Base) MCG/ACT inhaler Inhale 1-2 puffs into the lungs every 4 (four) hours as needed for wheezing or shortness of breath. 07/12/16  Yes Tanda Rockers, MD  amoxicillin (AMOXIL) 875 MG tablet Take 875 mg by mouth 2 (two) times daily. Maintenance med per MD   Yes Historical Provider, MD  amoxicillin-clavulanate (AUGMENTIN) 875-125 MG tablet Take 1 tablet by mouth 2 (two) times daily. 10/24/16  Yes Historical Provider, MD  b complex vitamins tablet Take 1 tablet by  mouth daily.     Yes Historical Provider, MD  bisacodyl (DULCOLAX) 5 MG EC tablet Take 5 mg by mouth 2 (two) times daily.    Yes Historical Provider, MD  calcitRIOL (ROCALTROL) 0.25 MCG capsule Take 0.25 mcg by mouth daily.  09/06/15  Yes Historical Provider, MD  clopidogrel (PLAVIX) 75 MG tablet Take 75 mg by mouth daily. 10/01/16  Yes Historical Provider, MD  cyclophosphamide (CYTOXAN) 25 MG capsule Take 25 mg by mouth daily. Take one capsule by mouth daily on an empty stomach 1 hour before or 2 hours after meal. 07/26/16  Yes Historical Provider, MD  dextromethorphan-guaiFENesin (MUCINEX DM) 30-600 MG 12hr tablet Take 1 tablet by mouth 2 (two) times daily.   Yes Historical Provider, MD  finasteride (PROSCAR) 5 MG tablet TAKE ONE TABLET BY MOUTH ONCE DAILY 08/29/16  Yes [name], MD  guaiFENesin-codeine 100-10 MG/5ML syrup Take 10 mLs by mouth every 6 (six) hours as needed for cough. 08/23/16  Yes Eber Jones, MD  ipratropium-albuterol (DUONEB) 0.5-2.5 (3)  MG/3ML SOLN Take 3 mLs by nebulization every 4 (four) hours as needed (if inhaler is not effective in resolving shortness of breath or wheezing). Patient taking differently: Take 3 mLs by nebulization every 4 (four) hours as needed (WITH FLUTTER VALVE FOR WHEEZING, SHORTNESS OF BREATH-PLAN C).  02/03/16  Yes Tanda Rockers, MD  loratadine (CLARITIN) 10 MG tablet Take 10 mg by mouth daily.    Yes Historical Provider, MD  Methylcellulose, Laxative, (CITRUCEL PO) Take 1 packet by mouth daily.   Yes Historical Provider, MD  metoprolol succinate (TOPROL-XL) 25 MG 24 hr tablet Take 1 tablet (25 mg total) by mouth daily. 07/18/16  Yes Pixie Casino, MD  nitroGLYCERIN (NITROSTAT) 0.4 MG SL tablet Place 1 tablet (0.4 mg total) under the tongue every 5 (five) minutes x 3 doses as needed for chest pain. 03/24/15  Yes Pixie Casino, MD  predniSONE (DELTASONE) 5 MG tablet Take 7.5 mg by mouth daily with breakfast.   Yes Historical Provider, MD    ranitidine (ZANTAC) 300 MG tablet Take 300 mg by mouth 2 (two) times daily.   Yes Historical Provider, MD  simethicone (MYLICON) 408 MG chewable tablet Chew 125 mg by mouth every 6 (six) hours as needed for flatulence. TAKE PER BOX FOR GAS    Yes Historical Provider, MD  simvastatin (ZOCOR) 20 MG tablet TAKE ONE TABLET BY MOUTH ONCE DAILY AT  6  PM 07/07/16  Yes Pixie Casino, MD  sodium bicarbonate 650 MG tablet Take 2 tablets (1,300 mg total) by mouth 2 (two) times daily. NEED OV. 08/12/15  Yes Pixie Casino, MD  sodium chloride (OCEAN) 0.65 % SOLN nasal spray Place 1 spray into both nostrils as needed for congestion.   Yes Historical Provider, MD  SYMBICORT 160-4.5 MCG/ACT inhaler INHALE TWO PUFFS BY MOUTH TWICE DAILY 06/01/16  Yes Tanda Rockers, MD  tamsulosin (FLOMAX) 0.4 MG CAPS capsule TAKE ONE CAPSULE BY MOUTH ONCE DAILY IN THE EVENING 07/05/16  Yes [name], MD  traMADol (ULTRAM) 50 MG tablet 1-2 every 4 hours as needed for cough or pain Patient taking differently: Take 50 mg by mouth every 4 (four) hours as needed for moderate pain.  09/15/16  Yes Tanda Rockers, MD    Physical Exam: Vitals:   11/01/16 1545 11/01/16 1600 11/01/16 1615 11/01/16 1645  BP: 127/65 122/75 110/74 115/73  Pulse: 89 91 89 (!) 112  Resp: 17 14 18  (!) 29  Temp:      TempSrc:      SpO2: 99% 100% 99% 100%     General:  Appears calm, slightly pale but not uncomfortable Eyes:  PERRL, EOMI, normal lids, iris ENT:  grossly normal hearing, lips & tongue, mucous membranes of his mouth are pink but slightly dry Neck:  no LAD, masses or thyromegaly Cardiovascular:  RRR, +murmur. No LE edema.  Respiratory:  Mild increased work of breathing with conversation BS diffuse throughout. Moist productive cough Abdomen:  soft, ntnd, +BS no guarding or rebounding Skin:  no rash or induration seen on limited exam Musculoskeletal:  grossly normal tone BUE/BLE, good ROM, no bony abnormality Psychiatric:  grossly  normal mood and affect, speech fluent and appropriate, AOx3 Neurologic:  CN 2-12 grossly intact, moves all extremities in coordinated fashion, sensation intact  Labs on Admission: I have personally reviewed following labs and imaging studies  CBC:  Recent Labs Lab 11/01/16 1249 11/01/16 1315  WBC  --  7.5  NEUTROABS  --  6.4  HGB 6.6* 6.8*  HCT  --  20.7*  MCV  --  111.9*  PLT  --  101*   Basic Metabolic Panel:  Recent Labs Lab 11/01/16 1315  NA 143  K 4.6  CL 110  CO2 24  GLUCOSE 127*  BUN 45*  CREATININE 1.80*  CALCIUM 8.5*   GFR: CrCl cannot be calculated (Unknown ideal weight.). Liver Function Tests:  Recent Labs Lab 11/01/16 1315  AST 18  ALT 15*  ALKPHOS 66  BILITOT 0.2*  PROT 4.6*  ALBUMIN 2.7*   No results for input(s): LIPASE, AMYLASE in the last 168 hours. No results for input(s): AMMONIA in the last 168 hours. Coagulation Profile:  Recent Labs Lab 11/01/16 1315  INR 1.09   Cardiac Enzymes: No results for input(s): CKTOTAL, CKMB, CKMBINDEX, TROPONINI in the last 168 hours. BNP (last 3 results) No results for input(s): PROBNP in the last 8760 hours. HbA1C: No results for input(s): HGBA1C in the last 72 hours. CBG: No results for input(s): GLUCAP in the last 168 hours. Lipid Profile: No results for input(s): CHOL, HDL, LDLCALC, TRIG, CHOLHDL, LDLDIRECT in the last 72 hours. Thyroid Function Tests: No results for input(s): TSH, T4TOTAL, FREET4, T3FREE, THYROIDAB in the last 72 hours. Anemia Panel:  Recent Labs  11/01/16 1242  FERRITIN 588*  TIBC 182*  IRON 115   Urine analysis:    Component Value Date/Time   COLORURINE YELLOW 08/21/2016 1806   APPEARANCEUR CLEAR 08/21/2016 1806   LABSPEC 1.015 08/21/2016 1806   PHURINE 8.0 08/21/2016 1806   GLUCOSEU NEGATIVE 08/21/2016 1806   GLUCOSEU NEGATIVE 07/26/2013 1142   HGBUR NEGATIVE 08/21/2016 1806   HGBUR trace-intact 11/13/2008 0000   BILIRUBINUR NEGATIVE 08/21/2016 1806    BILIRUBINUR neg 05/16/2012 1300   KETONESUR NEGATIVE 08/21/2016 1806   PROTEINUR 30 (A) 08/21/2016 1806   UROBILINOGEN 0.2 03/25/2014 1637   NITRITE NEGATIVE 08/21/2016 1806   LEUKOCYTESUR NEGATIVE 08/21/2016 1806    Creatinine Clearance: CrCl cannot be calculated (Unknown ideal weight.).  Sepsis Labs: @LABRCNTIP (procalcitonin:4,lacticidven:4) )No results found for this or any previous visit (from the past 240 hour(s)).   Radiological Exams on Admission: No results found.  EKG: Independently reviewed. Sinus rhythm Multiple ventricular premature complexes Borderline prolonged PR interval Anteroseptal infarct, age indeterminate  Assessment/Plan Principal Problem:   Symptomatic anemia Active Problems:   Hypothyroidism   Hyperlipidemia   Wegener's granulomatosis (HCC)   GERD   BPH (benign prostatic hyperplasia)   Chronic respiratory failure (H[facility])   Hypertension   CAD- severe 4 V CAD- not CABG candidate   Cardiomyopathy, ischemic- EF 40% by Myoview   Chronic kidney disease, stage IV (severe) (HCC)   Aortic valve stenosis   Melena   #1. Symptomatic anemia. Likely lower GI bleed. Patient is on Plavix. Also on 81 mg of aspirin. Denies any NSAID use. Colonoscopy December 2017 revealing diverticulosis, polyps, single nonbleeding colonic angiodysplastic lesion. Hemoglobin 6.8 on admission. FOBT +.  2 units of packed red blood cells started in the emergency department as well as Protonix drip. Of note patient is on weekly Epogen with parameters.  -Admit -Follow anemia panel -Continue blood transfusion -Serial CBCs -Continue protonic strip -Clear liquids for now. Doubt he could have procedure today given that he took his Plavix -Await gastroenterology input  #2. worsening dyspnea with exertion/worsening cardiomyopathy in the setting of #1. Patient also with a history of aortic stenosis. Oxygen saturation level greater than 90% on room air. Chest x-ray pending. Chart review indicates  gradual worsening dyspnea with exertion over the last several months. He saw cardiology November 2017 and was scheduled to have an echocardiogram tomorrow. Office note indicates concern for worsening cardiomyopathy worsening valvular heart disease.. No lower extremity edema. Chart review indicates his EF 60-65% improved from 40% -Follow chest x-ray -Obtain an echocardiogram -Monitor oxygen saturation level  #3. Chronic kidney disease stage III. Creatinine 1.8 on admission. This appears to be close to his baseline. -Hold nephrotoxins -Monitor urine output -Gentle IV fluids -Recheck in am  #4. Hypertension. Stable in the emergency department. Home medications include metoprolol -Hold metoprolol for now -Monitor closely  #5.Wegener's granulomatosis. On immunosuppression -continue prednisone  #6.CAD. s/p PCI. Denies chest pain. EKG without acute changes. -Continue home meds. -chart review indicates not surgery candidate  #7. Aortic valve stenosis. Loud murmur on exam. Chart review indicates cardiology recommending 2-D echo on last office visit to evaluate for worsening. This was scheduled for tomorrow. -will get echocardiogram   DVT prophylaxis: scd  Code Status: full  Family Communication: wife at bedside  Disposition Plan: home when ready  Consults called: stark with GI  Admission status: inpatient    Radene Gunning MD [facility]  If 7PM-7AM, please contact night-coverage [email or web address] Password [facility]  11/01/2016, 5:36 PM

## 2016-11-01 NOTE — ED Notes (Signed)
Daughter called and thinks the pt is having a rectal bleed from a medication he is on. Family informed that is probable not the case due to his hx, but the MD will be informed of her concern. Daughter heard screaming on the phone stating, "I'm gonna curse that bitch out when I get there". Janett Billow, [place] informed.

## 2016-11-02 ENCOUNTER — Telehealth: Payer: Self-pay | Admitting: Internal Medicine

## 2016-11-02 ENCOUNTER — Other Ambulatory Visit (HOSPITAL_COMMUNITY): Payer: Medicare Other

## 2016-11-02 DIAGNOSIS — I2510 Atherosclerotic heart disease of native coronary artery without angina pectoris: Secondary | ICD-10-CM

## 2016-11-02 DIAGNOSIS — E784 Other hyperlipidemia: Secondary | ICD-10-CM

## 2016-11-02 DIAGNOSIS — K219 Gastro-esophageal reflux disease without esophagitis: Secondary | ICD-10-CM

## 2016-11-02 DIAGNOSIS — I10 Essential (primary) hypertension: Secondary | ICD-10-CM

## 2016-11-02 DIAGNOSIS — I255 Ischemic cardiomyopathy: Secondary | ICD-10-CM

## 2016-11-02 DIAGNOSIS — J9611 Chronic respiratory failure with hypoxia: Secondary | ICD-10-CM

## 2016-11-02 DIAGNOSIS — E034 Atrophy of thyroid (acquired): Secondary | ICD-10-CM

## 2016-11-02 DIAGNOSIS — Z9861 Coronary angioplasty status: Secondary | ICD-10-CM

## 2016-11-02 DIAGNOSIS — M3130 Wegener's granulomatosis without renal involvement: Secondary | ICD-10-CM

## 2016-11-02 DIAGNOSIS — K921 Melena: Secondary | ICD-10-CM

## 2016-11-02 DIAGNOSIS — D649 Anemia, unspecified: Secondary | ICD-10-CM

## 2016-11-02 DIAGNOSIS — N400 Enlarged prostate without lower urinary tract symptoms: Secondary | ICD-10-CM

## 2016-11-02 LAB — BPAM RBC
BLOOD PRODUCT EXPIRATION DATE: 201804142359
BLOOD PRODUCT EXPIRATION DATE: 201804202359
ISSUE DATE / TIME: 201803271439
ISSUE DATE / TIME: 201803272027
UNIT TYPE AND RH: 5100
Unit Type and Rh: 5100

## 2016-11-02 LAB — CBC
HEMATOCRIT: 27.5 % — AB (ref 39.0–52.0)
HEMOGLOBIN: 9 g/dL — AB (ref 13.0–17.0)
MCH: 31.9 pg (ref 26.0–34.0)
MCHC: 32.7 g/dL (ref 30.0–36.0)
MCV: 97.5 fL (ref 78.0–100.0)
Platelets: 123 10*3/uL — ABNORMAL LOW (ref 150–400)
RBC: 2.82 MIL/uL — ABNORMAL LOW (ref 4.22–5.81)
RDW: 23.4 % — AB (ref 11.5–15.5)
WBC: 6 10*3/uL (ref 4.0–10.5)

## 2016-11-02 LAB — TYPE AND SCREEN
ABO/RH(D): O POS
ANTIBODY SCREEN: NEGATIVE
UNIT DIVISION: 0
Unit division: 0

## 2016-11-02 LAB — TROPONIN I: Troponin I: <0.03 ng/mL (ref <0.03)

## 2016-11-02 MED ORDER — ENSURE ENLIVE PO LIQD
237.0000 mL | Freq: Two times a day (BID) | ORAL | Status: DC
  Administered 2016-11-02: 237 mL via ORAL

## 2016-11-02 NOTE — Progress Notes (Signed)
   11/02/16 1410  Clinical Encounter Type  Visited With Patient and family together  Visit Type Other (Comment) ([place] consult)  Spiritual Encounters  Spiritual Needs Prayer  Stress Factors  Patient Stress Factors None identified  Family Stress Factors None identified  Introduction to Pt and family. Offered blessing prayer.

## 2016-11-02 NOTE — Progress Notes (Signed)
Daily Rounding Note  11/02/2016, 11:10 AM  LOS: 1 day   SUBJECTIVE:   Chief complaint: dark stool.  None since yesterday AM.  On clears.  No cp.  No change in stable dyspnea.  No dizziness.      OBJECTIVE:         Vital signs in last 24 hours:    Temp:  [98.4 F (36.9 C)-98.8 F (37.1 C)] 98.8 F (37.1 C) (03/28 0331) Pulse Rate:  [89-112] 108 (03/28 0331) Resp:  [14-29] 18 (03/28 0331) BP: (90-130)/(49-80) 115/56 (03/28 0331) SpO2:  [92 %-100 %] 92 % (03/28 0331) Weight:  [54.1 kg (119 lb 4.3 oz)] 54.1 kg (119 lb 4.3 oz) (03/28 0331) Last BM Date: 11/01/16 Filed Weights   11/02/16 0331  Weight: 54.1 kg (119 lb 4.3 oz)   General: frail, comfortable, looks chronically unwell   Heart: RRR with harsh systolic murmer that radiated into abdomen.   Chest: clear bil.  No syspnea, less coughing than yesterday. Abdomen: soft, NT.  Aortic murmer audible.  Active BS, NT.    Extremities: no CCE.  Extensive heme staining in lower arms.  Neuro/Psych:  Pleasant, calm, alert and fully oriented.   Intake/Output from previous day: 03/27 0701 - 03/28 0700 In: 670 [Blood:670] Out: 1100 [Urine:1100]  Intake/Output this shift: Total I/O In: 240 [P.O.:240] Out: 500 [Urine:500]  Lab Results:  Recent Labs  11/01/16 1249 11/01/16 1315 11/02/16 0144  WBC  --  7.5 6.0  HGB 6.6* 6.8* 9.0*  HCT  --  20.7* 27.5*  PLT  --  136* 123*   BMET  Recent Labs  11/01/16 1315  NA 143  K 4.6  CL 110  CO2 24  GLUCOSE 127*  BUN 45*  CREATININE 1.80*  CALCIUM 8.5*   LFT  Recent Labs  11/01/16 1315  PROT 4.6*  ALBUMIN 2.7*  AST 18  ALT 15*  ALKPHOS 66  BILITOT 0.2*   PT/INR  Recent Labs  11/01/16 1315  LABPROT 14.2  INR 1.09   Hepatitis Panel No results for input(s): HEPBSAG, HCVAB, HEPAIGM, HEPBIGM in the last 72 hours.  Studies/Results: Dg Chest Port 1 View  Result Date: 11/01/2016 CLINICAL DATA:   Shortness of breath for 1 week EXAM: PORTABLE CHEST 1 VIEW COMPARISON:  10/18/2016 FINDINGS: Cardiomediastinal silhouette is stable. Stable bronchiectasis and reticulonodular interstitial prominence consistent with sequela from chronic atypical mycobacterial infection. No definite superimposed infiltrate or pulmonary edema. IMPRESSION: Stable bronchiectasis and reticulonodular interstitial prominence consistent with sequela from chronic atypical mycobacterial infection. No definite superimposed infiltrate or pulmonary edema. Electronically Signed   By: Lahoma Crocker M.D.   On: 11/01/2016 17:58   Scheduled Meds: . B-complex with vitamin C  1 tablet Oral Daily  . bisacodyl  5 mg Oral BID  . calcitRIOL  0.25 mcg Oral Daily  . cyclophosphamide  25 mg Oral Daily  . dextromethorphan-guaiFENesin  1 tablet Oral BID  . feeding supplement  1 Container Oral TID BM  . finasteride  5 mg Oral Daily  . loratadine  10 mg Oral Daily  . mometasone-formoterol  2 puff Inhalation BID  . predniSONE  7.5 mg Oral Q breakfast  . simvastatin  20 mg Oral q1800  . sodium bicarbonate  1,300 mg Oral BID  . sodium chloride flush  3 mL Intravenous Q12H  . tamsulosin  0.4 mg Oral QPC supper   Continuous Infusions: . pantoprozole (PROTONIX) infusion 8 mg/hr (11/02/16 0742)  PRN Meds:.acetaminophen **OR** acetaminophen, albuterol, guaiFENesin-codeine, HYDROcodone-acetaminophen, ipratropium-albuterol, nitroGLYCERIN, ondansetron **OR** ondansetron (ZOFRAN) IV, sodium chloride, traMADol  ASSESMENT:   *  GIB.  Melena.  Colonoscopy 07/2016 with polypectomies and cauterization of non bleeding AVM, pan diverticulosis but no active bleeding source.  07/2016 EGD: esophageal stenosis and HH.   *  Acute blood loss anemia with underlying background of anemia of chronic kidney disease.  2 weeks ago Hgb 12.1 and he has not required weekly Neupogen for the past couple of weeks. hgb 6.8 .. 9.0 after PRBC x 2.  Dr Deterding manages epogen.      *  Chronic Plavix and aspirin for 4 vessel CAD and previous stent 2015.  Long term ? If Plavix can be permanently discontinued?  Last dose on 3/26.     PLAN   *  Regular diet.  No plans yet for repeat EGD or colonoscopy.  Do we need cards eval to make decision re Plavix?  Note echocardiogram ordered for today.     Richard [name]  11/02/2016, 11:10 AM Pager: [phone]     Attending physician's note   I have taken an interval history, reviewed the chart and examined the patient. I agree with the Advanced Practitioner's note, impression and recommendations. Bleeding has stopped. Strongly suspect bleeding from diverticulosis or AVMs as noted on recent colonoscopy. Consult cardiology to address Plavix. Given his recurrent bleeds can Plavix be permanently discontinued? No plans for repeat colonoscopy or EGD at this time. Trend CBC.    Lucio Edward, MD [facility] [phone] Mon-Fri 8a-5p [phone] after 5p, weekends, holidays

## 2016-11-02 NOTE — Telephone Encounter (Signed)
Spoke  To [facility]  Hosptial "cardmaster" patient is not on heartcare's service at present time.  recommendation for wife to inform attending doctor to order echo while patient is there or if needed to call cardiology in for consultation.   information was given to to wife she verbalized understanding. She states she wanted to know if Dr Debara Pickett would be in hospital today. RN inform wife that  he would not , but if need our service can be consulted by attending doctor.  will forward to Dr Debara Pickett for his information

## 2016-11-02 NOTE — Care Management Note (Signed)
Case Management Note Marvetta Gibbons RN, BSN Unit 2W-Case Manager [phone]  Patient Details  Name: Richard [name] MRN: [id number] Date of Birth: [birth year]  Subjective/Objective:    Pt admitted with symptomatic anemia- hgb 6.8                Action/Plan: PTA pt lived at home with wife- anticipate return home- CM to follow for potential d/c needs  Expected Discharge Date:                  Expected Discharge Plan:  Home/Self Care  In-House Referral:     Discharge planning Services  CM Consult  Post Acute Care Choice:    Choice offered to:     DME Arranged:    DME Agency:     HH Arranged:    [place] Agency:     Status of Service:  In process, will continue to follow  If discussed at Long Length of Stay Meetings, dates discussed:    Additional Comments:  Dawayne Patricia, RN 11/02/2016, 12:17 PM

## 2016-11-02 NOTE — Consult Note (Signed)
Cardiology Consult    Patient ID: JOZSEF WESCOAT MRN: [id number], DOB/AGE: [birth year]   Admit date: 11/01/2016 Date of Consult: 11/02/2016  Primary Physician: Alysia Penna, MD Primary Cardiologist: Dr. Debara Pickett Requesting Provider: Dr. Hartford Poli  Reason for Consult: can plavix be discontinued  Patient Profile    Mr. Schwager is a 81 yo male with a PMH significant for CAD s/p PCI with bare metal stents x 2 placed to LAD and mid-RCA with PTCA to circumflex (03/2014), ischemic cardiomyopathy, Wegener's granulomatosis, CKD stage IV, BPH, and COPD. He presented to [facility] with symptomatic anemia and suspected GI bleed. Cardiology was consulted to make recommendations for permanently discontinuing anti-platelet therapy in the presence of anemia.  Past Medical History   Past Medical History:  Diagnosis Date  . Adenomatous colon polyp   . Anemia   . Anemia   . Asthma   . BPH (benign prostatic hyperplasia)    sees Dr. Risa Grill, biopsy June 2015 was benign   . Bronchiectasis ([place])   . CAD (coronary artery disease)    a. [place] s/p [place] x3 and ultimately BMS to pLAD& POBA to CTO of circumflex/marginal vessel on 03/17/14 and staged BMS to [facility] on 03/21/14  . Chronic bronchitis ([place])   . COPD (chronic obstructive pulmonary disease) ([place])   . Diverticulosis   . Femoral artery pseudo-aneurysm, right ([place])    a. s/p repair. Follow up with Dr. Kellie Simmering   . Fungal infection    lungs  . GERD (gastroesophageal reflux disease)   . Heart murmur   . Hiatal hernia   . History of blood transfusion 07/2016; 11/01/2016   "low blood; low blood"  . History of kidney stones   . HOH (hard of hearing)    bilaterally  . HTN (hypertension) 09/28/2013  . Peptic stricture of esophagus   . Pneumonia    "1-2 times" (11/01/2016)  . Wegener's granulomatosis ([place])    sees Dr. [name] ; "went from my lungs to my kidneys" (11/01/2016)    Past Surgical History:  Procedure Laterality Date  . CARDIAC CATHETERIZATION  03/17/2014   Procedure: CORONARY BALLOON ANGIOPLASTY;  Surgeon: Troy Sine, MD;  Location: [facility] CATH LAB;  Service: Cardiovascular;;  . CATARACT EXTRACTION W/ INTRAOCULAR LENS  IMPLANT, BILATERAL  12/18/2012  . CHOLECYSTECTOMY N/A 12/21/2012   Procedure: LAPAROSCOPIC CHOLECYSTECTOMY WITH INTRAOPERATIVE CHOLANGIOGRAM;  Surgeon: Edward Jolly, MD;  Location: [facility] ORS;  Service: General;  Laterality: N/A;  . COLONOSCOPY  08-16-11   per Dr. Fuller Plan, diverticulosis and polyps, repeat in 5 yrs   . COLONOSCOPY N/A 08/01/2016   Procedure: COLONOSCOPY;  Surgeon: Carol Ada, MD;  Location: [facility] ENDOSCOPY;  Service: Endoscopy;  Laterality: N/A;  . CORONARY ANGIOPLASTY  03/2014  . ESOPHAGOGASTRODUODENOSCOPY N/A 07/31/2016   Procedure: ESOPHAGOGASTRODUODENOSCOPY (EGD);  Surgeon: Carol Ada, MD;  Location: [facility] ENDOSCOPY;  Service: Endoscopy;  Laterality: N/A;  . ESOPHAGOGASTRODUODENOSCOPY (EGD) WITH ESOPHAGEAL DILATION  11-29-10   per Dr. Fuller Plan   . HEMATOMA EVACUATION Right 03/19/2014   Procedure: Suture repair of femoral artery with evacuation of hematoma;  Surgeon: Mal Misty, MD;  Location: [place];  Service: Vascular;  Laterality: Right;  . INGUINAL HERNIA REPAIR Right ~ 2002  . LEFT HEART CATHETERIZATION WITH CORONARY ANGIOGRAM N/A 03/14/2014   Procedure: LEFT HEART CATHETERIZATION WITH CORONARY ANGIOGRAM;  Surgeon: Leonie Man, MD;  Location: [facility] CATH LAB;  Service: Cardiovascular;  Laterality: N/A;  . LUNG BIOPSY  03/2014  . PERCUTANEOUS CORONARY STENT INTERVENTION (PCI-S) N/A 03/17/2014  Procedure: PERCUTANEOUS CORONARY STENT INTERVENTION (PCI-S);  Surgeon: Troy Sine, MD;  Location: [facility] CATH LAB;  Service: Cardiovascular;  Laterality: N/A;  . PERCUTANEOUS CORONARY STENT INTERVENTION (PCI-S) N/A 03/21/2014   Procedure: PERCUTANEOUS CORONARY STENT INTERVENTION (PCI-S);  Surgeon: Burnell Blanks, MD;  Location: [facility] CATH LAB;  Service: Cardiovascular;  Laterality: N/A;  . RENAL BIOPSY        Allergies  No Known Allergies  History of Present Illness    Mr. Gabler is s/p BMS PCI to mid RCA and proximal LAD with PTCA to occluded circumflex-OM 2015 and is currently on ASA and palvix as home medications. In 2015, staged PCIs were performed to decrease contrast nephropathy. At that time, it was decided to medically manage his ramus stenosis, which was noted to be "moderately stenosed" at that time.  He is known to our service and last saw Dr. Debara Pickett in clinic on 07/07/16. During this hospitalization, he was found to have symptomatic anemia likely secondary to lower GI bleed. GI consulted. Plavix has been discontinued, last dose 10/31/16. He has received 2U PRBC and his Hb is now 9.0 (6.6 on admission).   Inpatient Medications    . B-complex with vitamin C  1 tablet Oral Daily  . bisacodyl  5 mg Oral BID  . calcitRIOL  0.25 mcg Oral Daily  . cyclophosphamide  25 mg Oral Daily  . dextromethorphan-guaiFENesin  1 tablet Oral BID  . feeding supplement (ENSURE ENLIVE)  237 mL Oral BID BM  . finasteride  5 mg Oral Daily  . loratadine  10 mg Oral Daily  . mometasone-formoterol  2 puff Inhalation BID  . predniSONE  7.5 mg Oral Q breakfast  . simvastatin  20 mg Oral q1800  . sodium bicarbonate  1,300 mg Oral BID  . sodium chloride flush  3 mL Intravenous Q12H  . tamsulosin  0.4 mg Oral QPC supper     Outpatient Medications    Prior to Admission medications   Medication Sig Start Date End Date Taking? Authorizing Provider  Acetaminophen (TYLENOL) 325 MG CAPS Take 325 mg by mouth every 6 (six) hours as needed (pain). TAKE PER BOX AS NEEDED FOR PAIN    Yes Historical Provider, MD  albuterol (VENTOLIN HFA) 108 (90 Base) MCG/ACT inhaler Inhale 1-2 puffs into the lungs every 4 (four) hours as needed for wheezing or shortness of breath. 07/12/16  Yes Tanda Rockers, MD  amoxicillin (AMOXIL) 875 MG tablet Take 875 mg by mouth 2 (two) times daily. Maintenance med per MD   Yes Historical  Provider, MD  amoxicillin-clavulanate (AUGMENTIN) 875-125 MG tablet Take 1 tablet by mouth 2 (two) times daily. 10/24/16  Yes Historical Provider, MD  b complex vitamins tablet Take 1 tablet by mouth daily.     Yes Historical Provider, MD  bisacodyl (DULCOLAX) 5 MG EC tablet Take 5 mg by mouth 2 (two) times daily.    Yes Historical Provider, MD  calcitRIOL (ROCALTROL) 0.25 MCG capsule Take 0.25 mcg by mouth daily.  09/06/15  Yes Historical Provider, MD  clopidogrel (PLAVIX) 75 MG tablet Take 75 mg by mouth daily. 10/01/16  Yes Historical Provider, MD  cyclophosphamide (CYTOXAN) 25 MG capsule Take 25 mg by mouth daily. Take one capsule by mouth daily on an empty stomach 1 hour before or 2 hours after meal. 07/26/16  Yes Historical Provider, MD  dextromethorphan-guaiFENesin (MUCINEX DM) 30-600 MG 12hr tablet Take 1 tablet by mouth 2 (two) times daily.   Yes Historical Provider,  MD  finasteride (PROSCAR) 5 MG tablet TAKE ONE TABLET BY MOUTH ONCE DAILY 08/29/16  Yes [name], MD  guaiFENesin-codeine 100-10 MG/5ML syrup Take 10 mLs by mouth every 6 (six) hours as needed for cough. 08/23/16  Yes Eber Jones, MD  ipratropium-albuterol (DUONEB) 0.5-2.5 (3) MG/3ML SOLN Take 3 mLs by nebulization every 4 (four) hours as needed (if inhaler is not effective in resolving shortness of breath or wheezing). Patient taking differently: Take 3 mLs by nebulization every 4 (four) hours as needed (WITH FLUTTER VALVE FOR WHEEZING, SHORTNESS OF BREATH-PLAN C).  02/03/16  Yes Tanda Rockers, MD  loratadine (CLARITIN) 10 MG tablet Take 10 mg by mouth daily.    Yes Historical Provider, MD  Methylcellulose, Laxative, (CITRUCEL PO) Take 1 packet by mouth daily.   Yes Historical Provider, MD  metoprolol succinate (TOPROL-XL) 25 MG 24 hr tablet Take 1 tablet (25 mg total) by mouth daily. 07/18/16  Yes Pixie Casino, MD  nitroGLYCERIN (NITROSTAT) 0.4 MG SL tablet Place 1 tablet (0.4 mg total) under the tongue every 5  (five) minutes x 3 doses as needed for chest pain. 03/24/15  Yes Pixie Casino, MD  predniSONE (DELTASONE) 5 MG tablet Take 7.5 mg by mouth daily with breakfast.   Yes Historical Provider, MD  ranitidine (ZANTAC) 300 MG tablet Take 300 mg by mouth 2 (two) times daily.   Yes Historical Provider, MD  simethicone (MYLICON) 710 MG chewable tablet Chew 125 mg by mouth every 6 (six) hours as needed for flatulence. TAKE PER BOX FOR GAS    Yes Historical Provider, MD  simvastatin (ZOCOR) 20 MG tablet TAKE ONE TABLET BY MOUTH ONCE DAILY AT  6  PM 07/07/16  Yes Pixie Casino, MD  sodium bicarbonate 650 MG tablet Take 2 tablets (1,300 mg total) by mouth 2 (two) times daily. NEED OV. 08/12/15  Yes Pixie Casino, MD  sodium chloride (OCEAN) 0.65 % SOLN nasal spray Place 1 spray into both nostrils as needed for congestion.   Yes Historical Provider, MD  SYMBICORT 160-4.5 MCG/ACT inhaler INHALE TWO PUFFS BY MOUTH TWICE DAILY 06/01/16  Yes Tanda Rockers, MD  tamsulosin (FLOMAX) 0.4 MG CAPS capsule TAKE ONE CAPSULE BY MOUTH ONCE DAILY IN THE EVENING 07/05/16  Yes [name], MD  traMADol (ULTRAM) 50 MG tablet 1-2 every 4 hours as needed for cough or pain Patient taking differently: Take 50 mg by mouth every 4 (four) hours as needed for moderate pain.  09/15/16  Yes Tanda Rockers, MD     Family History     Family History  Problem Relation Age of Onset  . Asthma Father   . Coronary artery disease Mother   . Coronary artery disease Brother   . Coronary artery disease Brother   . Colon cancer Neg Hx   . Stomach cancer Neg Hx     Social History    Social History   Social History  . Marital status: Married    Spouse name: N/A  . Number of children: N/A  . Years of education: N/A   Occupational History  . Retired    Social History Main Topics  . Smoking status: Never Smoker  . Smokeless tobacco: Never Used  . Alcohol use No  . Drug use: No  . Sexual activity: Not on file   Other Topics  Concern  . Not on file   Social History Narrative  . No narrative on file  Review of Systems    General:  No chills, fever, night sweats or weight changes.  Cardiovascular:  No chest pain, dyspnea on exertion, edema, orthopnea, palpitations, paroxysmal nocturnal dyspnea. Dermatological: No rash, lesions/masses Respiratory: No cough, dyspnea Urologic: No hematuria, dysuria Abdominal:   No nausea, vomiting, diarrhea, bright red blood per rectum, melena, or hematemesis Neurologic:  No visual changes, wkns, changes in mental status. All other systems reviewed and are otherwise negative except as noted above.  Physical Exam    Blood pressure 124/69, pulse (!) 115, temperature 98 F (36.7 C), temperature source Oral, resp. rate 20, weight 119 lb 4.3 oz (54.1 kg), SpO2 97 %.  General: Pleasant, NAD Psych: Normal affect. Neuro: Alert and oriented X 3. Moves all extremities spontaneously. HEENT: Normal  Neck: Supple without bruits or JVD. Lungs:  Resp regular and unlabored, CTA. Heart: RRR grade 5/6 systolic murmur. Abdomen: Soft, non-tender, non-distended, BS + x 4.  Extremities: No clubbing, cyanosis or edema. DP/PT/Radials 2+ and equal bilaterally.  Labs    Troponin (Point of Care Test) No results for input(s): TROPIPOC in the last 72 hours.  Recent Labs  11/02/16 0144  TROPONINI <0.03   Lab Results  Component Value Date   WBC 6.0 11/02/2016   HGB 9.0 (L) 11/02/2016   HCT 27.5 (L) 11/02/2016   MCV 97.5 11/02/2016   PLT 123 (L) 11/02/2016    Recent Labs Lab 11/01/16 1315  NA 143  K 4.6  CL 110  CO2 24  BUN 45*  CREATININE 1.80*  CALCIUM 8.5*  PROT 4.6*  BILITOT 0.2*  ALKPHOS 66  ALT 15*  AST 18  GLUCOSE 127*   Lab Results  Component Value Date   CHOL 103 (L) 03/24/2015   HDL 45 03/24/2015   LDLCALC 39 03/24/2015   TRIG 97 03/24/2015   Lab Results  Component Value Date   DDIMER 2.61 (H) 03/22/2014     Radiology Studies    Dg Chest 2  View  Result Date: 10/18/2016 CLINICAL DATA:  Cough x 8-12 weeks, flu in early January, chronic bronchitis EXAM: CHEST  2 VIEW COMPARISON:  Chest radiographs dated 09/15/2016. CT chest dated 07/30/2016. FINDINGS: Reticulonodular opacities with bronchiectasis in the right upper lobe, right middle lobe, lingula, and bilateral lower lobes. This appearance is unchanged from the priors and favors chronic atypical mycobacterial infection. No superimposed opacities suspicious for pneumonia. Minimal blunting of the bilateral costophrenic angles with possible trace right pleural effusion. No pneumothorax. The heart is normal in size. Visualized osseous structures are within normal limits. 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 No evidence of acute cardiopulmonary disease. Electronically Signed   By: Julian Hy M.D.   On: 10/18/2016 13:31   Dg Chest Port 1 View  Result Date: 11/01/2016 CLINICAL DATA:  Shortness of breath for 1 week EXAM: PORTABLE CHEST 1 VIEW COMPARISON:  10/18/2016 FINDINGS: Cardiomediastinal silhouette is stable. Stable bronchiectasis and reticulonodular interstitial prominence consistent with sequela from chronic atypical mycobacterial infection. No definite superimposed infiltrate or pulmonary edema. IMPRESSION: Stable bronchiectasis and reticulonodular interstitial prominence consistent with sequela from chronic atypical mycobacterial infection. No definite superimposed infiltrate or pulmonary edema. Electronically Signed   By: Lahoma Crocker M.D.   On: 11/01/2016 17:58    ECG & Cardiac Imaging    EKG 11/01/16: sinus rhythm with PVCs  Echocardiogram: pending  Echocardiogram 10/20/15: Study Conclusions - Left ventricle: The cavity size was normal. There was severe   focal basal hypertrophy of the septum. Systolic  function was   normal. The estimated ejection fraction was in the range of 60%   to 65%. Wall motion was normal; there were no regional  wall   motion abnormalities. Doppler parameters are consistent with   abnormal left ventricular relaxation (grade 1 diastolic   dysfunction). The E/e&' ratio is >15, suggesting elevated LV   filling pressure. - Aortic valve: Trileaflet; mildly calcified leaflets. Moderate   stenosis. Mean gradient (S): 20 mm Hg. Peak gradient (S): 33 mm   Hg. Valve area (VTI): 1.22 cm^2. Valve area (Vmax): 1.25 cm^2.   Valve area (Vmean): 1.25 cm^2. - Mitral valve: Mildly thickened leaflets . - Left atrium: The atrium was normal in size. - Inferior vena cava: The vessel was normal in size. The   respirophasic diameter changes were in the normal range (>= 50%),   consistent with normal central venous pressure.  Impressions: - Compared to the prior echo in 03/2015, there is now moderate   aortic stenosis - AVA of 1.2 cm2. LVEF 60-65%, severe focal basal   septal hypertrophy without LVOT obstruction.  Heart Cath 03/14/14: POST-OPERATIVE DIAGNOSIS:    Severe multivessel CAD involving all 4 major branches: 90% proximal LAD with 100% mid subtotal occlusion with minimal distal flow after the distal diagonal branch, 80-90% proximal ramus intermedius, 100% mid OM subtotal occlusion prior to bifurcation, diffuse mid RCA disease with sequential 95% stenoses as well as 50-60% stenosis prior to the major marginal branch  Known cardiomyopathy with EF of 40% by Myoview stress test  PLAN OF CARE:  Return to nursing unit for standard radial cath care  Restart heparin 6 hours after TR band removal  Start low-dose beta blocker for rate control and CAD benefit  Will review films with Interventional Colleagues in order to determine the best course of action. Theoretically CABG would be considered for multivessel disease, however in the absence of the distal LAD for lead insertion, the benefit is less appreciable. ? Will consult CT surgery for their opinion, but would also consider the possibility of staged multivessel  PCI with possible mechanical support.    Would keep patient in-patient pending decision to be made based on the severity of his symptoms.   Heart Cath 03/17/14 IMPRESSION: Successful two-vessel coronary intervention with Angiosculpt/bare-metal stenting of the proximal LAD with a 95% stenosis being reduced to 0% (3.0x18 mm Vision stent post dilated to 3.2, 3 mm) and PTCA of a chronically occluded circumflex marginal vessel.  RECOMMENDATION: The patient will be aggressively hydrated.  Following this, two-vessel intervention.  The omental stenting was used due to the patient's significant recent anemia and his Wegener's granulomatosis.  Plans are for staged PCI to his RCA proximally 48 hours if renal function allows.   Heart Cath 03/21/14 Procedure Performed:  1. PTCA/bare metal stent x 1 mid RCA 2. Rotablator atherectomy RCA 3. Placement temporary transvenous pacemaker  Recommendations: Continue ASA and Plavix for at least one month. I had used 40 minutes of fluoro time and a large volume of contrast. I did not perform angiography of the intermediate branch which appeared to have moderate stenosis on the diagnostic films from last week. Would favor medical management of intermediate branch for now and if he has recurrent angina, consider PCI of intermediate branch then.   Complex prolonged procedure due to calcification of vessel, severe stenoses and inability to pass balloons across the stenosis.   Assessment & Plan    1. CAD s/p PCI in 2015 with bare metal stents placed  to the LAD and mid-RCA - plavix and ASA were recommended at that time for one month; however, stenosis in ramus was being  medically managed. He denies chest pain, but would need DAPT if he developed anginal symptoms requiring PCI.  - can stop plavix for now; will discuss with attending about long-term need for DAPT. If plavix needs to be stopped permanently, this would preclude him from having future PCI or TAVR for  AS.   2. Aortic stenosis - moderate by echo in March 2017 - repeat echo pending - if he is a TAVR candidate, would likely need DAPT   2. GI bleed - per GI and primary team   Signed, Ledora Bottcher, PA-C 11/02/2016, 3:15 PM   I have seen, examined and evaluated the patient this PM along with Ms. Duke, PA-c.  After reviewing all the available data and chart, we discussed the patients laboratory, study & physical findings as well as symptoms in detail. I agree with her findings, examination as well as impression recommendations as per our discussion.    Very pleasant gentleman with history of CAD status post PCI as noted above in great detail. Still has at least moderate if not moderately severe aortic stenosis (echocardiogram still pending). He has been on long-standing aspirin and Plavix because of his multivessel CAD including moderate disease in the large ramus branch that was treated medically as well as PTCA only of the circumflex. We are being asked about potentially stopping Plavix going forward.  Clearly given his significant drop in hemoglobin, I think we need to stop aspirin or Plavix for now. Would not necessarily need to restart Plavix unless they become the reason such as needed for repeat PCI versus TAVR. Inappropriate well, that would not be any time soon which would then allow any potential bleeding sites to heal. Even if it meant being off Plavix for 6 months to a year, he likely would be doing fine. The very fact that he has not had any anginal symptoms leading up to this event as well as with significant anemia, would suggest that the existing disease is not flow-limiting and therefore concerning.   Recommendations for now will be to continue to hold aspirin plus Plavix for as long as necessary, would like to restart at least aspirin once safe from a GI perspective. Would be okay to stay off Plavix indefinitely -- low likelihood for bare-metal stents to have in-stent  thrombosis at this stage, but there is always a possibility. We can handle that potentially becomes.  -- He was on 25 mg Toprol at home, I would restart that based on the fact his heart rate is increased.  Was not on a statin at home. Will defer to primary cardiologist.    Glenetta Hew, M.D., M.S. Interventional Cardiologist   Pager # [phone] Phone # [phone] [place], [place] [address]

## 2016-11-02 NOTE — Telephone Encounter (Signed)
New message   Pt wife called and wants to let us know that Richard [name] is in the hospital and they will be keeping him the next 2 days. Someone in the hospital advised her that they can do the stress test there at the hospital if it is okay with [name]. She would like a call back. She has canceled today's ECHO and tomorrows ECHO and wants to know if they can simply do the ECHO in the hospital.

## 2016-11-02 NOTE — Progress Notes (Signed)
PROGRESS NOTE  Richard [name]  XHB:[id number] DOB: Jan 15, 1936 DOA: 11/01/2016 PCP: Alysia Penna, MD Outpatient Specialists:  Subjective: Denies any complaints this morning, denies SOB and chest pain. Hemoglobin is 9 this morning  Brief Narrative:  Richard [name] is a very pleasant 81 y.o. male with medical history significant for ischemic cardiomyopathy, hypertension, CAD status post stents, Wegener's granulomatosis, chronic kidney disease stage IV, chronic respiratory failure, obstructive bronchiectasis since to the emergency department with chief complaint of worsening melena generalized weakness. Initial evaluation reveals a hemoglobin of 6.8. Information is obtained from the patient and his wife is at the bedside. He reports gradual worsening of melena over the last week. He reports black stool over the last 24 hours. Denies diarrhea. He denies abdominal pain nausea vomiting. Associated symptoms include worsening dyspnea on exertion increased cough and generalized weakness. He denies chest pain palpitation headache dizziness syncope or near-syncope. He also endorses some decreased oral intake. He reports compliance with his 81 mg of aspirin every day and Plavix. He reports taking both today before presentation. He denies fever chills dysuria hematuria frequency or urgency.  Assessment & Plan:   Principal Problem:   Symptomatic anemia Active Problems:   Hypothyroidism   Hyperlipidemia   Wegener's granulomatosis (HCC)   GERD   BPH (benign prostatic hyperplasia)   Chronic respiratory failure (HCC)   Hypertension   CAD- severe 4 V CAD- not CABG candidate   Cardiomyopathy, ischemic- EF 40% by Myoview   Chronic kidney disease, stage IV (severe) (HCC)   Aortic valve stenosis   Melena   Symptomatic anemia -Likely lower GI bleed. Patient is on Plavix. Also on 81 mg of aspirin. Denies any NSAID use. -Colonoscopy December 2017 revealing diverticulosis, polyps, single nonbleeding colonic  angiodysplastic lesion.  -Hemoglobin 6.8 on admission. FOBT +.  2 units of packed RBC, hemoglobin improved to 9.  -He is on weekly Neupogen. -GI evaluated the patient recommended no intervention for now, and see if patient can be off of Plavix.  Worsening dyspnea with exertion/worsening cardiomyopathy -Patient also with a history of aortic stenosis. Oxygen saturation level greater than 90% on room air.  -Chart review indicates gradual worsening dyspnea with exertion over the last several months. Denies chest pain. -He saw cardiology November 2017, echo scheduled, will be done in the hospital. -Office note indicates concern for worsening cardiomyopathy worsening valvular heart disease. -No lower extremity edema. Chart review indicates his EF 60-65% improved from 40% -Follow 2-D echo.  Chronic kidney disease stage III. Creatinine 1.8 on admission. This appears to be close to his baseline. -Hold nephrotoxins -Monitor urine output -Gentle IV fluids -Recheck in am  Hypertension. Stable in the emergency department. Home medications include metoprolol -Hold metoprolol for now -Monitor closely  Wegener's granulomatosis. On immunosuppression -continue prednisone  CAD. s/p PCI. Denies chest pain. EKG without acute changes. -Continue home meds. -chart review indicates not surgery candidate  Aortic valve stenosis. Loud murmur on exam. Chart review indicates cardiology recommending 2-D echo on last office visit to evaluate for worsening. This was scheduled for tomorrow. -will get echocardiogram    DVT prophylaxis:  Code Status: Full Code Family Communication:  Disposition Plan:  Diet: Diet clear liquid Room service appropriate? Yes; Fluid consistency: Thin  Consultants:   GI  Procedures:   None  Antimicrobials:   None   Objective: Vitals:   11/01/16 2037 11/01/16 2052 11/01/16 2330 11/02/16 0331  BP: 108/65 125/70 115/69 (!) 115/56  Pulse: (!) 106 (!) 110 (!) 106  Marland Kitchen)  108  Resp: 18 18 18 18   Temp: 98.4 F (36.9 C) 98.5 F (36.9 C) 98.8 F (37.1 C) 98.8 F (37.1 C)  TempSrc: Oral Oral Oral Oral  SpO2: 100% 99% 95% 92%  Weight:    54.1 kg (119 lb 4.3 oz)    Intake/Output Summary (Last 24 hours) at 11/02/16 1024 Last data filed at 11/02/16 0900  Gross per 24 hour  Intake              910 ml  Output             1600 ml  Net             -690 ml   Filed Weights   11/02/16 0331  Weight: 54.1 kg (119 lb 4.3 oz)    Examination: General exam: Appears calm and comfortable  Respiratory system: Clear to auscultation. Respiratory effort normal. Cardiovascular system: S1 & S2 heard, RRR. No JVD, murmurs, rubs, gallops or clicks. No pedal edema. Gastrointestinal system: Abdomen is nondistended, soft and nontender. No organomegaly or masses felt. Normal bowel sounds heard. Central nervous system: Alert and oriented. No focal neurological deficits. Extremities: Symmetric 5 x 5 power. Skin: No rashes, lesions or ulcers Psychiatry: Judgement and insight appear normal. Mood & affect appropriate.   Data Reviewed: I have personally reviewed following labs and imaging studies  CBC:  Recent Labs Lab 11/01/16 1249 11/01/16 1315 11/02/16 0144  WBC  --  7.5 6.0  NEUTROABS  --  6.4  --   HGB 6.6* 6.8* 9.0*  HCT  --  20.7* 27.5*  MCV  --  111.9* 97.5  PLT  --  136* 161*   Basic Metabolic Panel:  Recent Labs Lab 11/01/16 1315  NA 143  K 4.6  CL 110  CO2 24  GLUCOSE 127*  BUN 45*  CREATININE 1.80*  CALCIUM 8.5*   GFR: Estimated Creatinine Clearance: 25.9 mL/min (A) (by C-G formula based on SCr of 1.8 mg/dL (H)). Liver Function Tests:  Recent Labs Lab 11/01/16 1315  AST 18  ALT 15*  ALKPHOS 66  BILITOT 0.2*  PROT 4.6*  ALBUMIN 2.7*   No results for input(s): LIPASE, AMYLASE in the last 168 hours. No results for input(s): AMMONIA in the last 168 hours. Coagulation Profile:  Recent Labs Lab 11/01/16 1315  INR 1.09   Cardiac  Enzymes:  Recent Labs Lab 11/02/16 0144  TROPONINI <0.03   BNP (last 3 results) No results for input(s): PROBNP in the last 8760 hours. HbA1C: No results for input(s): HGBA1C in the last 72 hours. CBG: No results for input(s): GLUCAP in the last 168 hours. Lipid Profile: No results for input(s): CHOL, HDL, LDLCALC, TRIG, CHOLHDL, LDLDIRECT in the last 72 hours. Thyroid Function Tests: No results for input(s): TSH, T4TOTAL, FREET4, T3FREE, THYROIDAB in the last 72 hours. Anemia Panel:  Recent Labs  11/01/16 1242  FERRITIN 588*  TIBC 182*  IRON 115   Urine analysis:    Component Value Date/Time   COLORURINE YELLOW 08/21/2016 1806   APPEARANCEUR CLEAR 08/21/2016 1806   LABSPEC 1.015 08/21/2016 1806   PHURINE 8.0 08/21/2016 1806   GLUCOSEU NEGATIVE 08/21/2016 1806   GLUCOSEU NEGATIVE 07/26/2013 1142   HGBUR NEGATIVE 08/21/2016 1806   HGBUR trace-intact 11/13/2008 0000   BILIRUBINUR NEGATIVE 08/21/2016 1806   BILIRUBINUR neg 05/16/2012 1300   KETONESUR NEGATIVE 08/21/2016 1806   PROTEINUR 30 (A) 08/21/2016 1806   UROBILINOGEN 0.2 03/25/2014 1637   NITRITE NEGATIVE 08/21/2016 1806  LEUKOCYTESUR NEGATIVE 08/21/2016 1806   Sepsis Labs: @LABRCNTIP (procalcitonin:4,lacticidven:4)  )No results found for this or any previous visit (from the past 240 hour(s)).   Invalid input(s): PROCALCITONIN, LACTICACIDVEN   Radiology Studies: Dg Chest Port 1 View  Result Date: 11/01/2016 CLINICAL DATA:  Shortness of breath for 1 week EXAM: PORTABLE CHEST 1 VIEW COMPARISON:  10/18/2016 FINDINGS: Cardiomediastinal silhouette is stable. Stable bronchiectasis and reticulonodular interstitial prominence consistent with sequela from chronic atypical mycobacterial infection. No definite superimposed infiltrate or pulmonary edema. IMPRESSION: Stable bronchiectasis and reticulonodular interstitial prominence consistent with sequela from chronic atypical mycobacterial infection. No definite  superimposed infiltrate or pulmonary edema. Electronically Signed   By: Lahoma Crocker M.D.   On: 11/01/2016 17:58        Scheduled Meds: . B-complex with vitamin C  1 tablet Oral Daily  . bisacodyl  5 mg Oral BID  . calcitRIOL  0.25 mcg Oral Daily  . cyclophosphamide  25 mg Oral Daily  . dextromethorphan-guaiFENesin  1 tablet Oral BID  . feeding supplement  1 Container Oral TID BM  . finasteride  5 mg Oral Daily  . loratadine  10 mg Oral Daily  . mometasone-formoterol  2 puff Inhalation BID  . predniSONE  7.5 mg Oral Q breakfast  . simvastatin  20 mg Oral q1800  . sodium bicarbonate  1,300 mg Oral BID  . sodium chloride flush  3 mL Intravenous Q12H  . tamsulosin  0.4 mg Oral QPC supper   Continuous Infusions: . pantoprozole (PROTONIX) infusion 8 mg/hr (11/02/16 0742)     LOS: 1 day    Time spent: 35 minutes    Nickalas Mccarrick A, MD [facility] Pager [phone]  If 7PM-7AM, please contact night-coverage [email or web address] Password [facility] 11/02/2016, 10:24 AM

## 2016-11-02 NOTE — Telephone Encounter (Signed)
Please assure her if they need cardiology, they will call us.  Dr. Lemmie Evens

## 2016-11-02 NOTE — Progress Notes (Signed)
Initial Nutrition Assessment   DOCUMENTATION CODES:   Severe malnutrition in context of chronic illness  INTERVENTION:    Ensure Enlive po BID, each supplement provides 350 kcal and 20 grams of protein  NUTRITION DIAGNOSIS:   Malnutrition related to chronic illness as evidenced by severe depletion of body fat, severe depletion of muscle mass, energy intake < 75% for > or equal to 3 months  GOAL:   Patient will meet greater than or equal to 90% of their needs  MONITOR:   PO intake, Supplement acceptance, Labs, Weight trends, Skin, I & O's  REASON FOR ASSESSMENT:   Malnutrition Screening Tool  ASSESSMENT:   81 y.o. Male with medical history significant for ischemic cardiomyopathy, hypertension, CAD status post stents, Wegener's granulomatosis, chronic kidney disease stage IV, chronic respiratory failure, obstructive bronchiectasis since to the emergency department with chief complaint of worsening melena generalized weakness. Initial evaluation reveals a hemoglobin of 6.8.  RD spoke with pt and wife at bedside. Admitted with symptomatic anemia and dyspnea. Reports he's been having a decreased appetite for months. Wife states he has been taking "bites" at meals recently at home.  He attributes this to a medication he was/is on for a fungal lung infection? Also reveals he's lost approximately 20 lbs in the last year.  Not significant for time frame. GI note reviewed.  No plans for repeat EGD or colonoscopy. Labs and medications reviewed.  Nutrition-Focused physical exam completed. Findings are severe fat depletion, severe muscle depletion, and no edema.   Diet Order:  Diet Heart Room service appropriate? Yes; Fluid consistency: Thin  Skin:  Reviewed, no issues  Last BM:  3/27  Height:   Ht Readings from Last 1 Encounters:  10/18/16 5\' 7"  (1.702 m)    Weight:   Wt Readings from Last 1 Encounters:  11/02/16 119 lb 4.3 oz (54.1 kg)    Ideal Body Weight:  67.2  kg  BMI:  Body mass index is 18.68 kg/m.  Estimated Nutritional Needs:   Kcal:  1600-1800  Protein:  80-90 gm  Fluid:  1.6-1.8 L  EDUCATION NEEDS:   No education needs identified at this time  Arthur Holms, RD, LDN Pager #: [phone] After-Hours Pager #: [phone]

## 2016-11-03 ENCOUNTER — Inpatient Hospital Stay (HOSPITAL_COMMUNITY): Payer: Medicare Other

## 2016-11-03 ENCOUNTER — Encounter (HOSPITAL_COMMUNITY): Payer: Self-pay | Admitting: Cardiology

## 2016-11-03 ENCOUNTER — Other Ambulatory Visit (HOSPITAL_COMMUNITY): Payer: Medicare Other

## 2016-11-03 DIAGNOSIS — I350 Nonrheumatic aortic (valve) stenosis: Secondary | ICD-10-CM

## 2016-11-03 DIAGNOSIS — E785 Hyperlipidemia, unspecified: Secondary | ICD-10-CM

## 2016-11-03 DIAGNOSIS — D62 Acute posthemorrhagic anemia: Principal | ICD-10-CM

## 2016-11-03 DIAGNOSIS — R0600 Dyspnea, unspecified: Secondary | ICD-10-CM

## 2016-11-03 DIAGNOSIS — N184 Chronic kidney disease, stage 4 (severe): Secondary | ICD-10-CM

## 2016-11-03 LAB — CBC
HCT: 24.8 % — ABNORMAL LOW (ref 39.0–52.0)
Hemoglobin: 8.3 g/dL — ABNORMAL LOW (ref 13.0–17.0)
MCH: 32.4 pg (ref 26.0–34.0)
MCHC: 33.5 g/dL (ref 30.0–36.0)
MCV: 96.9 fL (ref 78.0–100.0)
PLATELETS: 117 10*3/uL — AB (ref 150–400)
RBC: 2.56 MIL/uL — ABNORMAL LOW (ref 4.22–5.81)
RDW: 22.7 % — AB (ref 11.5–15.5)
WBC: 5.4 10*3/uL (ref 4.0–10.5)

## 2016-11-03 LAB — BASIC METABOLIC PANEL
Anion gap: 6 (ref 5–15)
BUN: 40 mg/dL — AB (ref 6–20)
CO2: 25 mmol/L (ref 22–32)
Calcium: 7.9 mg/dL — ABNORMAL LOW (ref 8.9–10.3)
Chloride: 112 mmol/L — ABNORMAL HIGH (ref 101–111)
Creatinine, Ser: 1.7 mg/dL — ABNORMAL HIGH (ref 0.61–1.24)
GFR, EST AFRICAN AMERICAN: 43 mL/min — AB (ref >60)
GFR, EST NON AFRICAN AMERICAN: 37 mL/min — AB (ref >60)
Glucose, Bld: 98 mg/dL (ref 65–99)
POTASSIUM: 4 mmol/L (ref 3.5–5.1)
SODIUM: 143 mmol/L (ref 135–145)

## 2016-11-03 LAB — ECHOCARDIOGRAM COMPLETE
Height: 67 in
Weight: 1908.3 oz

## 2016-11-03 MED ORDER — POLYETHYLENE GLYCOL 3350 17 G PO PACK
17.0000 g | PACK | Freq: Once | ORAL | Status: AC
  Administered 2016-11-03: 17 g via ORAL
  Filled 2016-11-03: qty 1

## 2016-11-03 MED ORDER — METOPROLOL SUCCINATE ER 25 MG PO TB24
25.0000 mg | ORAL_TABLET | Freq: Every day | ORAL | Status: DC
  Administered 2016-11-03 – 2016-11-04 (×2): 25 mg via ORAL
  Filled 2016-11-03 (×2): qty 1

## 2016-11-03 MED ORDER — GUAIFENESIN-DM 100-10 MG/5ML PO SYRP
5.0000 mL | ORAL_SOLUTION | ORAL | Status: DC | PRN
  Administered 2016-11-03: 5 mL via ORAL
  Filled 2016-11-03: qty 5

## 2016-11-03 MED ORDER — DOCUSATE SODIUM 100 MG PO CAPS
100.0000 mg | ORAL_CAPSULE | Freq: Two times a day (BID) | ORAL | Status: DC
  Administered 2016-11-03 – 2016-11-04 (×3): 100 mg via ORAL
  Filled 2016-11-03 (×3): qty 1

## 2016-11-03 MED ORDER — PANTOPRAZOLE SODIUM 40 MG PO TBEC
40.0000 mg | DELAYED_RELEASE_TABLET | Freq: Two times a day (BID) | ORAL | Status: DC
  Administered 2016-11-03 – 2016-11-04 (×3): 40 mg via ORAL
  Filled 2016-11-03 (×3): qty 1

## 2016-11-03 MED ORDER — FUROSEMIDE 10 MG/ML IJ SOLN
40.0000 mg | Freq: Once | INTRAMUSCULAR | Status: AC
  Administered 2016-11-03: 40 mg via INTRAVENOUS
  Filled 2016-11-03: qty 4

## 2016-11-03 MED ORDER — GUAIFENESIN ER 600 MG PO TB12
600.0000 mg | ORAL_TABLET | Freq: Two times a day (BID) | ORAL | Status: DC
Start: 2016-11-03 — End: 2016-11-04
  Administered 2016-11-03 – 2016-11-04 (×3): 600 mg via ORAL
  Filled 2016-11-03 (×3): qty 1

## 2016-11-03 NOTE — Progress Notes (Signed)
  Echocardiogram 2D Echocardiogram has been performed.  Richard [name] 11/03/2016, 12:29 PM

## 2016-11-03 NOTE — Progress Notes (Signed)
Progress Note  Patient Name: Richard [name] Date of Encounter: 11/03/2016  Primary Cardiologist: Hilty  - Reason for consult: 10 Plavix be discontinued in setting of GI bleed  Subjective   Feels better now. Some mild cough earlier, but nothing significant. He notes some dyspnea with exertion over the last several days, but nothing really new right now. Hemoglobin stable.  Inpatient Medications    Scheduled Meds: . B-complex with vitamin C  1 tablet Oral Daily  . bisacodyl  5 mg Oral BID  . calcitRIOL  0.25 mcg Oral Daily  . cyclophosphamide  25 mg Oral Daily  . docusate sodium  100 mg Oral BID  . feeding supplement (ENSURE ENLIVE)  237 mL Oral BID BM  . finasteride  5 mg Oral Daily  . guaiFENesin  600 mg Oral BID  . loratadine  10 mg Oral Daily  . metoprolol succinate  25 mg Oral Daily  . mometasone-formoterol  2 puff Inhalation BID  . pantoprazole  40 mg Oral BID  . predniSONE  7.5 mg Oral Q breakfast  . simvastatin  20 mg Oral q1800  . sodium bicarbonate  1,300 mg Oral BID  . sodium chloride flush  3 mL Intravenous Q12H  . tamsulosin  0.4 mg Oral QPC supper   Continuous Infusions:  PRN Meds: acetaminophen **OR** acetaminophen, albuterol, guaiFENesin-dextromethorphan, HYDROcodone-acetaminophen, ipratropium-albuterol, nitroGLYCERIN, ondansetron **OR** ondansetron (ZOFRAN) IV, sodium chloride, traMADol   Vital Signs    Vitals:   11/03/16 0920 11/03/16 1338 11/03/16 2102 11/03/16 2150  BP:  121/72  107/61  Pulse:  (!) 117  100  Resp:  18  16  Temp:  98.4 F (36.9 C)  98.7 F (37.1 C)  TempSrc:  Oral  Oral  SpO2: 98% 99% 100% 97%  Weight:      Height:        Intake/Output Summary (Last 24 hours) at 11/03/16 2259 Last data filed at 11/03/16 1800  Gross per 24 hour  Intake            517.5 ml  Output             2000 ml  Net          -1482.5 ml   Filed Weights   11/02/16 0331  Weight: 119 lb 4.3 oz (54.1 kg)    Telemetry    Sinus rhythm -  Personally Reviewed  ECG    n/a  Physical Exam   GEN: No acute distress.  Alert and oriented 3.  Neck: No JVD Cardiac: RRR, 3-4/6 SEM at RUSB radiates to carotids. Otherwise normal S1 and S2. No R/G. Respiratory: Clear to auscultation bilaterally. GI: Soft, nontender, non-distended  MS: No edema; No deformity. Neuro:  Nonfocal Moves all extremities Psych: Normal affect  Skin: diffuse ecchymoses on Bilateral UE  Labs    Chemistry Recent Labs Lab 11/01/16 1315 11/03/16 0321  NA 143 143  K 4.6 4.0  CL 110 112*  CO2 24 25  GLUCOSE 127* 98  BUN 45* 40*  CREATININE 1.80* 1.70*  CALCIUM 8.5* 7.9*  PROT 4.6*  --   ALBUMIN 2.7*  --   AST 18  --   ALT 15*  --   ALKPHOS 66  --   BILITOT 0.2*  --   GFRNONAA 34* 37*  GFRAA 40* 43*  ANIONGAP 9 6     Hematology Recent Labs Lab 11/01/16 1315 11/02/16 0144 11/03/16 0321  WBC 7.5 6.0 5.4  RBC 1.85* 2.82* 2.56*  HGB 6.8* 9.0* 8.3*  HCT 20.7* 27.5* 24.8*  MCV 111.9* 97.5 96.9  [name] 36.8* 31.9 32.4  MCHC 32.9 32.7 33.5  RDW 15.9* 23.4* 22.7*  PLT 136* 123* 117*    Cardiac Enzymes Recent Labs Lab 11/02/16 0144  TROPONINI <0.03   No results for input(s): TROPIPOC in the last 168 hours.   BNPNo results for input(s): BNP, PROBNP in the last 168 hours.   DDimer No results for input(s): DDIMER in the last 168 hours.   Radiology    Dg Chest 2 View  Result Date: 11/03/2016 CLINICAL DATA:  Dry cough that turned productive cough. EXAM: CHEST  2 VIEW COMPARISON:  11/01/2016 FINDINGS: Normal heart size and aortic contours. Hiatal hernia with fluid level. Diffuse interstitial coarsening with reticulonodular opacities and clustered nodular densities at the left base. By CT there are findings of bronchiectasis and chronic airway infection. No superimposed acute airspace disease. No edema, effusion, or pneumothorax. Atherosclerosis. IMPRESSION: 1. Chronic lung disease including bronchiectasis and mucoid impaction. No acute  finding, but superimposed infection could easily be obscured. 2. Moderate hiatal hernia. Electronically Signed   By: Monte Fantasia M.D.   On: 11/03/2016 12:25    Cardiac Studies    Echo 10/2015: Now Moderate AD (AVA ~1.2 cm) EF 60-65%. GR 1 DD.  Echo 11/03/16 (read after round complete) :EF 60-65% with moderate LVH. Only noted grade 1 diastolic dysfunction. Moderate aortic stenosis with moderate calcification and thickening. (Estimated valve area between 1.2-1.3 cm)  Past Cardiac Cath-PCI History:  Procedure Laterality Date  . LEFT HEART CATHETERIZATION WITH CORONARY ANGIOGRAM N/A 03/14/2014   Procedure: LEFT HEART CATHETERIZATION WITH CORONARY ANGIOGRAM;  Surgeon: Leonie Man, MD;  Location: [facility] CATH LAB: pLAD 90% calcified lesion --> after D1, LAD is Subtotally occluded. mCx 100% (L-L collaterals). pRI 60-80%. mRCA diffuse 80-95%.  Marland Kitchen PERCUTANEOUS CORONARY STENT INTERVENTION (PCI-S) N/A 03/17/2014   Procedure: PERCUTANEOUS CORONARY STENT INTERVENTION (PCI-S);  Surgeon: Troy Sine, MD;  Location: [facility] CATH LAB: Vision BMS 3.0 mm x 18 mm -- 3.2 mm); PTCA of CTO Cx-OM  . PERCUTANEOUS CORONARY STENT INTERVENTION (PCI-S) N/A 03/21/2014   Procedure: PERCUTANEOUS CORONARY STENT INTERVENTION (PCI-S);  Surgeon: Burnell Blanks, MD;  Location: [facility] CATH LAB: Rotational Atherectomy -- Mini Trek BMS 2.5 x 28 mm --> 2.75 mm)    Patient Profile     81 y.o. male with a PMH significant for CAD s/p PCI with bare metal stents x 2 placed to LAD and mid-RCA with PTCA to circumflex (03/2014), ischemic cardiomyopathy, Wegener's granulomatosis, CKD stage IV, BPH, and COPD. He presented to [facility] with symptomatic anemia and suspected GI bleed. Cardiology was consulted to make recommendations for permanently discontinuing anti-platelet therapy in the presence of anemia.  Assessment & Plan    Principal Problem:   Symptomatic anemia Active Problems:   Wegener's granulomatosis ([place])   CAD- severe 4 V CAD-  not CABG candidate: BMS-pLAD, mRCA &PTCA of 100% Cx-OM   Cardiomyopathy, ischemic- EF 40% by Myoview   Hyperlipidemia with target low density lipoprotein (LDL) cholesterol less than 70 mg/dL   Chronic respiratory failure (HCC)   Chronic kidney disease, stage IV (severe) (HCC)   Moderate aortic stenosis by prior echocardiogram   Hypothyroidism   GERD   BPH (benign prostatic hyperplasia)   Hypertension   Acute blood loss anemia   Melena  Rec per initial Consult:  With BMS PCI only - OK to hold Plavix indefinitely to allow for healing of GIB source  Restart Home Toprol dose  1 dose lasix given for mild edema - brisk diuresis  Echocardiogram shows stable EF and diastolic function with stable aortic valve disease. I suspect the murmur sounded louder as a factor of flow from his anemia. He is doing relatively well. He has not had any angina, and only having some exertional dyspnea. We are probably fine continue to hold Plavix. Heart rate and blood pressure have improved with beta blocker.  Current recommendations:   Aortic stenosis essentially moderate still. Would not necessarily need evaluation for TAVR anytime soon.  Suspect dyspnea is related to anemia  Continue beta blocker and statin --> Not on ACE-I/ARB for renal dysfunction & BP not high enough to recommend Hydralazine for afterload reduction (esp. In the absence of CHF Sx.)  Seems relatively euvolemic. I don't think he needs additional Lasix IV -- he may benefit from low dose ~20 mg PO Lasix for d/c  Otherwise remained stable. I will discuss the results the echocardiogram tomorrow, but I don't think he needs a full rounding tomorrow.   Signed, Glenetta Hew, MD  11/03/2016, 10:59 PM

## 2016-11-03 NOTE — Progress Notes (Signed)
PROGRESS NOTE  Richard [name]  PYK:[id number] DOB: [birth year] DOA: 11/01/2016 PCP: Richard Penna, MD Outpatient Specialists:  Subjective: Complaining about some cough this morning, reviewed previous x-rays seems he had bronchiectasis. No bowel movements since yesterday,hemoglobin stable at 8.3. Seen by cardiology recommended to hold Plavix on discharge.  Brief Narrative:  Richard [name] is a very pleasant 81 y.o. male with medical history significant for ischemic cardiomyopathy, hypertension, CAD status post stents, Wegener's granulomatosis, chronic kidney disease stage IV, chronic respiratory failure, obstructive bronchiectasis since to the emergency department with chief complaint of worsening melena generalized weakness. Initial evaluation reveals a hemoglobin of 6.8. Information is obtained from the patient and his wife is at the bedside. He reports gradual worsening of melena over the last week. He reports black stool over the last 24 hours. Denies diarrhea. He denies abdominal pain nausea vomiting. Associated symptoms include worsening dyspnea on exertion increased cough and generalized weakness. He denies chest pain palpitation headache dizziness syncope or near-syncope. He also endorses some decreased oral intake. He reports compliance with his 81 mg of aspirin every day and Plavix. He reports taking both today before presentation. He denies fever chills dysuria hematuria frequency or urgency.  Assessment & Plan:   Principal Problem:   Symptomatic anemia Active Problems:   Hypothyroidism   Hyperlipidemia with target low density lipoprotein (LDL) cholesterol less than 70 mg/dL   Wegener's granulomatosis (HCC)   GERD   BPH (benign prostatic hyperplasia)   Chronic respiratory failure (HCC)   Hypertension   CAD- severe 4 V CAD- not CABG candidate: BMS-pLAD, mRCA &PTCA of 100% Cx-OM   Cardiomyopathy, ischemic- EF 40% by Myoview   Chronic kidney disease, stage IV (severe) (HCC)  Moderate aortic stenosis by prior echocardiogram   Melena   Symptomatic anemia -Likely lower GI bleed. Patient is on Plavix. Also on 81 mg of aspirin. Denies any NSAID use. -Colonoscopy December 2017 revealing diverticulosis, polyps, single nonbleeding colonic angiodysplastic lesion.  -Hemoglobin 6.8 on admission. FOBT +.  2 units of packed RBC, hemoglobin improved to 9.  -He is on weekly Neupogen. -GI evaluated the patient recommended no intervention for now, and see if patient can be off of Plavix.  Worsening dyspnea with exertion/worsening cardiomyopathy -Patient also with a history of aortic stenosis. Oxygen saturation level greater than 90% on room air.  -Chart review indicates gradual worsening dyspnea with exertion over the last several months. Denies chest pain. -He saw cardiology November 2017, echo scheduled, will be done in the hospital. -Office note indicates concern for worsening cardiomyopathy worsening valvular heart disease. -No lower extremity edema. Chart review indicates his EF 60-65% improved from 40% -Follow 2-D echo.  Chronic kidney disease stage III. Creatinine 1.8 on admission. This appears to be close to his baseline. -Hold nephrotoxins -Monitor urine output -Gentle IV fluids -Recheck in am  Hypertension. Stable in the emergency department. Home medications include metoprolol -Hold metoprolol for now -Monitor closely  Wegener's granulomatosis. On immunosuppression -continue prednisone  CAD. s/p PCI. Denies chest pain. EKG without acute changes. -Continue home meds. -chart review indicates not surgery candidate  Aortic valve stenosis. Loud murmur on exam. Chart review indicates cardiology recommending 2-D echo on last office visit to evaluate for worsening. This was scheduled for tomorrow. -will get echocardiogram    DVT prophylaxis:  Code Status: Full Code Family Communication:  Disposition Plan:  Diet: Diet Heart Room service appropriate?  Yes; Fluid consistency: Thin  Consultants:   GI  Procedures:   None  Antimicrobials:  None   Objective: Vitals:   11/02/16 2059 11/02/16 2100 11/03/16 0500 11/03/16 0920  BP:   127/77   Pulse:  (!) 107 (!) 115   Resp:  18 18   Temp:   98.7 F (37.1 C)   TempSrc:   Oral   SpO2: 98% 98% 96% 98%  Weight:      Height:   5\' 7"  (1.702 m)     Intake/Output Summary (Last 24 hours) at 11/03/16 1229 Last data filed at 11/03/16 0500  Gross per 24 hour  Intake              597 ml  Output             2400 ml  Net            -1803 ml   Filed Weights   11/02/16 0331  Weight: 54.1 kg (119 lb 4.3 oz)    Examination: General exam: Appears calm and comfortable  Respiratory system: Clear to auscultation. Respiratory effort normal. Cardiovascular system: S1 & S2 heard, RRR. No JVD, murmurs, rubs, gallops or clicks. No pedal edema. Gastrointestinal system: Abdomen is nondistended, soft and nontender. No organomegaly or masses felt. Normal bowel sounds heard. Central nervous system: Alert and oriented. No focal neurological deficits. Extremities: Symmetric 5 x 5 power. Skin: No rashes, lesions or ulcers Psychiatry: Judgement and insight appear normal. Mood & affect appropriate.   Data Reviewed: I have personally reviewed following labs and imaging studies  CBC:  Recent Labs Lab 11/01/16 1249 11/01/16 1315 11/02/16 0144 11/03/16 0321  WBC  --  7.5 6.0 5.4  NEUTROABS  --  6.4  --   --   HGB 6.6* 6.8* 9.0* 8.3*  HCT  --  20.7* 27.5* 24.8*  MCV  --  111.9* 97.5 96.9  PLT  --  136* 123* 938*   Basic Metabolic Panel:  Recent Labs Lab 11/01/16 1315 11/03/16 0321  NA 143 143  K 4.6 4.0  CL 110 112*  CO2 24 25  GLUCOSE 127* 98  BUN 45* 40*  CREATININE 1.80* 1.70*  CALCIUM 8.5* 7.9*   GFR: Estimated Creatinine Clearance: 27.4 mL/min (A) (by C-G formula based on SCr of 1.7 mg/dL (H)). Liver Function Tests:  Recent Labs Lab 11/01/16 1315  AST 18  ALT 15*    ALKPHOS 66  BILITOT 0.2*  PROT 4.6*  ALBUMIN 2.7*   No results for input(s): LIPASE, AMYLASE in the last 168 hours. No results for input(s): AMMONIA in the last 168 hours. Coagulation Profile:  Recent Labs Lab 11/01/16 1315  INR 1.09   Cardiac Enzymes:  Recent Labs Lab 11/02/16 0144  TROPONINI <0.03   BNP (last 3 results) No results for input(s): PROBNP in the last 8760 hours. HbA1C: No results for input(s): HGBA1C in the last 72 hours. CBG: No results for input(s): GLUCAP in the last 168 hours. Lipid Profile: No results for input(s): CHOL, HDL, LDLCALC, TRIG, CHOLHDL, LDLDIRECT in the last 72 hours. Thyroid Function Tests: No results for input(s): TSH, T4TOTAL, FREET4, T3FREE, THYROIDAB in the last 72 hours. Anemia Panel:  Recent Labs  11/01/16 1242  FERRITIN 588*  TIBC 182*  IRON 115   Urine analysis:    Component Value Date/Time   COLORURINE YELLOW 08/21/2016 1806   APPEARANCEUR CLEAR 08/21/2016 1806   LABSPEC 1.015 08/21/2016 1806   PHURINE 8.0 08/21/2016 1806   GLUCOSEU NEGATIVE 08/21/2016 1806   GLUCOSEU NEGATIVE 07/26/2013 1142   HGBUR NEGATIVE 08/21/2016 1806  HGBUR trace-intact 11/13/2008 0000   BILIRUBINUR NEGATIVE 08/21/2016 1806   BILIRUBINUR neg 05/16/2012 1300   KETONESUR NEGATIVE 08/21/2016 1806   PROTEINUR 30 (A) 08/21/2016 1806   UROBILINOGEN 0.2 03/25/2014 1637   NITRITE NEGATIVE 08/21/2016 1806   LEUKOCYTESUR NEGATIVE 08/21/2016 1806   Sepsis Labs: @LABRCNTIP (procalcitonin:4,lacticidven:4)  )No results found for this or any previous visit (from the past 240 hour(s)).   Invalid input(s): PROCALCITONIN, LACTICACIDVEN   Radiology Studies: Dg Chest 2 View  Result Date: 11/03/2016 CLINICAL DATA:  Dry cough that turned productive cough. EXAM: CHEST  2 VIEW COMPARISON:  11/01/2016 FINDINGS: Normal heart size and aortic contours. Hiatal hernia with fluid level. Diffuse interstitial coarsening with reticulonodular opacities and  clustered nodular densities at the left base. By CT there are findings of bronchiectasis and chronic airway infection. No superimposed acute airspace disease. No edema, effusion, or pneumothorax. Atherosclerosis. IMPRESSION: 1. Chronic lung disease including bronchiectasis and mucoid impaction. No acute finding, but superimposed infection could easily be obscured. 2. Moderate hiatal hernia. Electronically Signed   By: Monte Fantasia M.D.   On: 11/03/2016 12:25   Dg Chest Port 1 View  Result Date: 11/01/2016 CLINICAL DATA:  Shortness of breath for 1 week EXAM: PORTABLE CHEST 1 VIEW COMPARISON:  10/18/2016 FINDINGS: Cardiomediastinal silhouette is stable. Stable bronchiectasis and reticulonodular interstitial prominence consistent with sequela from chronic atypical mycobacterial infection. No definite superimposed infiltrate or pulmonary edema. IMPRESSION: Stable bronchiectasis and reticulonodular interstitial prominence consistent with sequela from chronic atypical mycobacterial infection. No definite superimposed infiltrate or pulmonary edema. Electronically Signed   By: Lahoma Crocker M.D.   On: 11/01/2016 17:58        Scheduled Meds: . B-complex with vitamin C  1 tablet Oral Daily  . bisacodyl  5 mg Oral BID  . calcitRIOL  0.25 mcg Oral Daily  . cyclophosphamide  25 mg Oral Daily  . docusate sodium  100 mg Oral BID  . feeding supplement (ENSURE ENLIVE)  237 mL Oral BID BM  . finasteride  5 mg Oral Daily  . furosemide  40 mg Intravenous Once  . guaiFENesin  600 mg Oral BID  . loratadine  10 mg Oral Daily  . metoprolol succinate  25 mg Oral Daily  . mometasone-formoterol  2 puff Inhalation BID  . pantoprazole  40 mg Oral BID  . polyethylene glycol  17 g Oral Once  . predniSONE  7.5 mg Oral Q breakfast  . simvastatin  20 mg Oral q1800  . sodium bicarbonate  1,300 mg Oral BID  . sodium chloride flush  3 mL Intravenous Q12H  . tamsulosin  0.4 mg Oral QPC supper   Continuous Infusions:     LOS: 2 days    Time spent: 35 minutes    Arryana Tolleson A, MD [facility] Pager [phone]  If 7PM-7AM, please contact night-coverage [email or web address] Password [facility] 11/03/2016, 12:29 PM

## 2016-11-03 NOTE — Progress Notes (Signed)
Daily Rounding Note  11/03/2016, 8:33 AM  LOS: 2 days   SUBJECTIVE:   Chief complaint: feels less well in general.   Less energy.  More SOB.  More cough but not much sputum. Not getting nebulizers which he uses BID at home.  No stools since PTA.  Normally take colace BID.  No abd pain or nausea      OBJECTIVE:         Vital signs in last 24 hours:    Temp:  [98 F (36.7 C)-98.7 F (37.1 C)] 98.7 F (37.1 C) (03/29 0500) Pulse Rate:  [100-115] 115 (03/29 0500) Resp:  [18-20] 18 (03/29 0500) BP: (113-127)/(69-83) 127/77 (03/29 0500) SpO2:  [96 %-98 %] 96 % (03/29 0500) Last BM Date: 11/01/16 Filed Weights   11/02/16 0331  Weight: 54.1 kg (119 lb 4.3 oz)   General: frail, comfortable fully alert   Heart: RRR Chest: clear bil.  But overall reduced BS bil.   Tight, mucoid cough. Abdomen: soft, NT, ND.  Active BS  Extremities: no CCE Neuro/Psych:  Oriented x 3.   Fully alert.  No gross deficits.   Intake/Output from previous day: 03/28 0701 - 03/29 0700 In: 837 [P.O.:837] Out: 2900 [Urine:2900]  Intake/Output this shift: No intake/output data recorded.  Lab Results:  Recent Labs  11/01/16 1315 11/02/16 0144 11/03/16 0321  WBC 7.5 6.0 5.4  HGB 6.8* 9.0* 8.3*  HCT 20.7* 27.5* 24.8*  PLT 136* 123* 117*   BMET  Recent Labs  11/01/16 1315 11/03/16 0321  NA 143 143  K 4.6 4.0  CL 110 112*  CO2 24 25  GLUCOSE 127* 98  BUN 45* 40*  CREATININE 1.80* 1.70*  CALCIUM 8.5* 7.9*   LFT  Recent Labs  11/01/16 1315  PROT 4.6*  ALBUMIN 2.7*  AST 18  ALT 15*  ALKPHOS 66  BILITOT 0.2*   PT/INR  Recent Labs  11/01/16 1315  LABPROT 14.2  INR 1.09   Hepatitis Panel No results for input(s): HEPBSAG, HCVAB, HEPAIGM, HEPBIGM in the last 72 hours.  Studies/Results: Dg Chest Port 1 View  Result Date: 11/01/2016 CLINICAL DATA:  Shortness of breath for 1 week EXAM: PORTABLE CHEST 1 VIEW COMPARISON:   10/18/2016 FINDINGS: Cardiomediastinal silhouette is stable. Stable bronchiectasis and reticulonodular interstitial prominence consistent with sequela from chronic atypical mycobacterial infection. No definite superimposed infiltrate or pulmonary edema. IMPRESSION: Stable bronchiectasis and reticulonodular interstitial prominence consistent with sequela from chronic atypical mycobacterial infection. No definite superimposed infiltrate or pulmonary edema. Electronically Signed   By: Lahoma Crocker M.D.   On: 11/01/2016 17:58    ASSESMENT:   *  GIB.  Melena.  Colonoscopy 07/2016 with polypectomies and cauterization of non bleeding AVM, pan diverticulosis but no active bleeding source.  07/2016 EGD: esophageal stenosis and HH.   * Acute blood loss anemia with underlying background of anemia of stage 3 chronic kidney disease.  2 weeks ago Hgb12.1,  has not required weekly Neupogen for the past couple of weeks. hgb 6.8.Marland Kitchen  PRBC x 2 .. 9.0.. 8.3.  Dr Deterding manages epogen.    * Chronic Plavix and aspirin for 4 vessel CAD and previous stent 2015. Per Dr Ellyn Hack: "continue to hold aspirin plus Plavix for as long as necessary, would like to restart at least aspirin once safe from a GI perspective. Would be okay to stay off Plavix indefinitely".    PLAN   *  Hold Plavix long  term.  Will d/w Dr Fuller Plan timing of 81 ASA restart.    *  CBC in AM.    *  Switch to daily Protonix.   Start back BID colace and give single dose of Miralax this AM.      Azucena Freed  11/03/2016, 8:33 AM Pager: [phone]    Attending physician's note   I have taken an interval history, reviewed the chart and examined the patient. I agree with the Advanced Practitioner's note, impression and recommendations. No recurrent GI bleeding. Hb equilibrating. Pt notes increased cough. Cardiology has OK'd remaining off Plavix indefinitely. OK to resume ASA 81 mg daily on Monday. Do not resume Plavix. Resume Colace bid and Miralax x 1  today. OK for discharge from GI standpoint. Follow up with Dr. Jimmy Footman for anemia mgmt. GI follow up in 1 month. GI signing off.   Lucio Edward, MD [facility] [phone] Mon-Fri 8a-5p [phone] after 5p, weekends, holidays

## 2016-11-04 LAB — CBC
HCT: 28.8 % — ABNORMAL LOW (ref 39.0–52.0)
HEMOGLOBIN: 9.3 g/dL — AB (ref 13.0–17.0)
MCH: 31.8 pg (ref 26.0–34.0)
MCHC: 32.3 g/dL (ref 30.0–36.0)
MCV: 98.6 fL (ref 78.0–100.0)
Platelets: 137 10*3/uL — ABNORMAL LOW (ref 150–400)
RBC: 2.92 MIL/uL — ABNORMAL LOW (ref 4.22–5.81)
RDW: 21.9 % — ABNORMAL HIGH (ref 11.5–15.5)
WBC: 5.8 10*3/uL (ref 4.0–10.5)

## 2016-11-04 MED ORDER — PANTOPRAZOLE SODIUM 40 MG PO TBEC: 40.0000 mg | DELAYED_RELEASE_TABLET | Freq: Every day | ORAL | 0 refills | Status: DC

## 2016-11-04 NOTE — Discharge Summary (Signed)
Physician Discharge Summary  Richard [name] OFH:[id number] DOB: July 23, 1936 DOA: 11/01/2016  PCP: Alysia Penna, MD  Admit date: 11/01/2016 Discharge date: 11/04/2016  Admitted From: Home Disposition: Home  Recommendations for Outpatient Follow-up:  1. Follow up with PCP in 1-2 weeks 2. Please obtain BMP/CBC in one week. 3. Plavix discontinued because of recurrent GI bleed.  Home Health: NA Equipment/Devices:NA  Discharge Condition: Stable CODE STATUS: Full Code Diet recommendation: Diet Heart Room service appropriate? Yes; Fluid consistency: Thin Diet - low sodium heart healthy  Brief/Interim Summary: Richard Donning Wallaceis avery [id number] y.o.malewith medical history significant for ischemic cardiomyopathy, hypertension, CAD status post stents, Wegener's granulomatosis, chronic kidney disease stage IV, chronic respiratory failure, obstructive bronchiectasis since to the emergency department with chief complaint of worsening melena generalized weakness. Initial evaluation reveals a hemoglobin of 6.8. Information is obtained from the patient and his wife is at the bedside. He reports gradual worsening of melena over the last week. He reports black stool over the last 24 hours. Denies diarrhea. He denies abdominal pain nausea vomiting. Associated symptoms include worsening dyspnea on exertion increased cough and generalized weakness. He denies chest pain palpitation headache dizziness syncope or near-syncope. He also endorses some decreased oral intake. He reports compliance with his 81 mg of aspirin every day and Plavix. He reports taking both today before presentation. He denies fever chills dysuria hematuria frequency or urgency.  Discharge Diagnoses:  Principal Problem:   Symptomatic anemia Active Problems:   Hypothyroidism   Hyperlipidemia with target low density lipoprotein (LDL) cholesterol less than 70 mg/dL   Wegener's granulomatosis (HCC)   GERD   BPH (benign prostatic  hyperplasia)   Chronic respiratory failure (HCC)   Hypertension   CAD- severe 4 V CAD- not CABG candidate: BMS-pLAD, mRCA &PTCA of 100% Cx-OM   Cardiomyopathy, ischemic- EF 40% by Myoview   Acute blood loss anemia   Chronic kidney disease, stage IV (severe) (HCC)   Moderate aortic stenosis by prior echocardiogram   Melena   Symptomatic anemia -Likely lower GI bleed. Patient is on Plavix. Also on 81 mg of aspirin. Denies any NSAID use. -Colonoscopy December 2017 revealing diverticulosis, polyps, single nonbleeding colonic angiodysplastic lesion.  -Hemoglobin 6.8 on admission. FOBT +. 2 units of packed RBC, hemoglobin improved to 9.  -He is on weekly Neupogen. -GI evaluated the patient recommended no intervention for now. -Cardiology consulted and recommended to continue aspirin only and discontinue Plavix. Discharge home on Protonix.  Worseningdyspnea with exertion -Patient also with a history of aortic stenosis. Oxygen saturation level greater than 90% on room air.  -Chart review indicates gradual worsening dyspnea with exertion over the last several months. Denies chest pain. -He saw cardiology November 2017, echo scheduled, will be done in the hospital. -Office note indicates concern for worsening cardiomyopathy worsening valvular heart disease. -No lower extremity edema. Chart review indicates his EF 60-65% improved from 40% -2-D echo repeated on 3/29 showed LVEF of 60-65%, moderate aortic stenosis. -Worsening dyspnea could be secondary to his anemia versus bronchiectasis.  Chronic kidney disease stage III-IV. Creatinine 1.8 on admission. This appears to be close to his baseline. -Hold nephrotoxins -Creatinine is 1.7 around baseline.  Hypertension. Stable in the emergency department. Home medications include metoprolol -Home medications restarted on discharge.  Wegener's granulomatosis. On immunosuppression -continue prednisone  CAD. s/p PCI. Denies chest pain. EKG  without acute changes. -Continue the aspirin only, Plavix discontinued because of concurrent GI bleed. -chart review indicates not surgery candidate  Aortic valve stenosis.  -  Loud murmur on exam, 2-D echo showed moderate aortic stenosis.  Bronchiectasis -Patient has Wegener's granulomatosis, recent CT scan showed bronchiectasis. -Patient has some cough and mucus production without fever and chills, no evidence of infection. -Recommended to follow-up with PCP/ pulmonologist for his bronchiectasis, this likely affecting his exercise tolerance.   Discharge Instructions  Discharge Instructions    Diet - low sodium heart healthy    Complete by:  As directed    Increase activity slowly    Complete by:  As directed      Allergies as of 11/04/2016   No Known Allergies     Medication List    STOP taking these medications   amoxicillin 875 MG tablet Commonly known as:  AMOXIL   amoxicillin-clavulanate 875-125 MG tablet Commonly known as:  AUGMENTIN   clopidogrel 75 MG tablet Commonly known as:  PLAVIX   ranitidine 300 MG tablet Commonly known as:  ZANTAC     TAKE these medications   albuterol 108 (90 Base) MCG/ACT inhaler Commonly known as:  VENTOLIN HFA Inhale 1-2 puffs into the lungs every 4 (four) hours as needed for wheezing or shortness of breath.   b complex vitamins tablet Take 1 tablet by mouth daily.   bisacodyl 5 MG EC tablet Commonly known as:  DULCOLAX Take 5 mg by mouth 2 (two) times daily.   calcitRIOL 0.25 MCG capsule Commonly known as:  ROCALTROL Take 0.25 mcg by mouth daily.   CITRUCEL PO Take 1 packet by mouth daily.   cyclophosphamide 25 MG capsule Commonly known as:  CYTOXAN Take 25 mg by mouth daily. Take one capsule by mouth daily on an empty stomach 1 hour before or 2 hours after meal.   dextromethorphan-guaiFENesin 30-600 MG 12hr tablet Commonly known as:  MUCINEX DM Take 1 tablet by mouth 2 (two) times daily.   finasteride 5 MG  tablet Commonly known as:  PROSCAR TAKE ONE TABLET BY MOUTH ONCE DAILY   guaiFENesin-codeine 100-10 MG/5ML syrup Take 10 mLs by mouth every 6 (six) hours as needed for cough.   ipratropium-albuterol 0.5-2.5 (3) MG/3ML Soln Commonly known as:  DUONEB Take 3 mLs by nebulization every 4 (four) hours as needed (if inhaler is not effective in resolving shortness of breath or wheezing). What changed:  reasons to take this   loratadine 10 MG tablet Commonly known as:  CLARITIN Take 10 mg by mouth daily.   metoprolol succinate 25 MG 24 hr tablet Commonly known as:  TOPROL-XL Take 1 tablet (25 mg total) by mouth daily.   nitroGLYCERIN 0.4 MG SL tablet Commonly known as:  NITROSTAT Place 1 tablet (0.4 mg total) under the tongue every 5 (five) minutes x 3 doses as needed for chest pain.   pantoprazole 40 MG tablet Commonly known as:  PROTONIX Take 1 tablet (40 mg total) by mouth daily.   predniSONE 5 MG tablet Commonly known as:  DELTASONE Take 7.5 mg by mouth daily with breakfast.   simethicone 125 MG chewable tablet Commonly known as:  MYLICON Chew 397 mg by mouth every 6 (six) hours as needed for flatulence. TAKE PER BOX FOR GAS   simvastatin 20 MG tablet Commonly known as:  ZOCOR TAKE ONE TABLET BY MOUTH ONCE DAILY AT  6  PM   sodium bicarbonate 650 MG tablet Take 2 tablets (1,300 mg total) by mouth 2 (two) times daily. NEED OV.   sodium chloride 0.65 % Soln nasal spray Commonly known as:  OCEAN Place 1 spray into  both nostrils as needed for congestion.   SYMBICORT 160-4.5 MCG/ACT inhaler Generic drug:  budesonide-formoterol INHALE TWO PUFFS BY MOUTH TWICE DAILY   tamsulosin 0.4 MG Caps capsule Commonly known as:  FLOMAX TAKE ONE CAPSULE BY MOUTH ONCE DAILY IN THE EVENING   traMADol 50 MG tablet Commonly known as:  ULTRAM 1-2 every 4 hours as needed for cough or pain What changed:  how much to take  how to take this  when to take this  reasons to take  this  additional instructions   TYLENOL 325 MG Caps Generic drug:  Acetaminophen Take 325 mg by mouth every 6 (six) hours as needed (pain). TAKE PER BOX AS NEEDED FOR PAIN       No Known Allergies  Consultations:  Treatment Team:   Rounding [facility]iology, MD  Procedures Echo done on 11/03/16 Study Conclusions  - Left ventricle: The cavity size was normal. Wall thickness was   increased in a pattern of moderate LVH. There was focal basal   hypertrophy. Systolic function was normal. The estimated ejection   fraction was in the range of 60% to 65%. Wall motion was normal;   there were no regional wall motion abnormalities. Doppler   parameters are consistent with abnormal left ventricular   relaxation (grade 1 diastolic dysfunction). - Aortic valve: Mildly to moderately calcified annulus. Moderately   thickened, moderately calcified leaflets. There was moderate   stenosis. Valve area (VTI): 1.39 cm^2. Valve area (Vmax): 1.2   cm^2. Valve area (Vmean): 1.2 cm^2. - Mitral valve: There was mild regurgitation.  Radiological studies: Dg Chest 2 View  Result Date: 11/03/2016 CLINICAL DATA:  Dry cough that turned productive cough. EXAM: CHEST  2 VIEW COMPARISON:  11/01/2016 FINDINGS: Normal heart size and aortic contours. Hiatal hernia with fluid level. Diffuse interstitial coarsening with reticulonodular opacities and clustered nodular densities at the left base. By CT there are findings of bronchiectasis and chronic airway infection. No superimposed acute airspace disease. No edema, effusion, or pneumothorax. Atherosclerosis. IMPRESSION: 1. Chronic lung disease including bronchiectasis and mucoid impaction. No acute finding, but superimposed infection could easily be obscured. 2. Moderate hiatal hernia. Electronically Signed   By: Monte Fantasia M.D.   On: 11/03/2016 12:25   Dg Chest 2 View  Result Date: 10/18/2016 CLINICAL DATA:  Cough x 8-12 weeks, flu in early January, chronic  bronchitis EXAM: CHEST  2 VIEW COMPARISON:  Chest radiographs dated 09/15/2016. CT chest dated 07/30/2016. FINDINGS: Reticulonodular opacities with bronchiectasis in the right upper lobe, right middle lobe, lingula, and bilateral lower lobes. This appearance is unchanged from the priors and favors chronic atypical mycobacterial infection. No superimposed opacities suspicious for pneumonia. Minimal blunting of the bilateral costophrenic angles with possible trace right pleural effusion. No pneumothorax. The heart is normal in size. Visualized osseous structures are within normal limits. 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 No evidence of acute cardiopulmonary disease. Electronically Signed   By: Julian Hy M.D.   On: 10/18/2016 13:31   Dg Chest Port 1 View  Result Date: 11/01/2016 CLINICAL DATA:  Shortness of breath for 1 week EXAM: PORTABLE CHEST 1 VIEW COMPARISON:  10/18/2016 FINDINGS: Cardiomediastinal silhouette is stable. Stable bronchiectasis and reticulonodular interstitial prominence consistent with sequela from chronic atypical mycobacterial infection. No definite superimposed infiltrate or pulmonary edema. IMPRESSION: Stable bronchiectasis and reticulonodular interstitial prominence consistent with sequela from chronic atypical mycobacterial infection. No definite superimposed infiltrate or pulmonary edema. Electronically Signed   By: Orlean Bradford.D.  On: 11/01/2016 17:58     Subjective:  Discharge Exam: Vitals:   11/03/16 2150 11/04/16 0500 11/04/16 0630 11/04/16 0728  BP: 107/61  (!) 96/56   Pulse: 100  (!) 112   Resp: 16  18   Temp: 98.7 F (37.1 C)  98.5 F (36.9 C)   TempSrc: Oral  Oral   SpO2: 97%  95% 95%  Weight:  50.2 kg (110 lb 10.7 oz)    Height:       General: Pt is alert, awake, not in acute distress Cardiovascular: RRR, S1/S2 +, no rubs, no gallops Respiratory: CTA bilaterally, no wheezing, no rhonchi Abdominal: Soft, NT,  ND, bowel sounds + Extremities: no edema, no cyanosis   The results of significant diagnostics from this hospitalization (including imaging, microbiology, ancillary and laboratory) are listed below for reference.    Microbiology: No results found for this or any previous visit (from the past 240 hour(s)).   Labs: BNP (last 3 results)  Recent Labs  01/07/16 1540 07/30/16 1455  BNP 151.7* 203.5*   Basic Metabolic Panel:  Recent Labs Lab 11/01/16 1315 11/03/16 0321  NA 143 143  K 4.6 4.0  CL 110 112*  CO2 24 25  GLUCOSE 127* 98  BUN 45* 40*  CREATININE 1.80* 1.70*  CALCIUM 8.5* 7.9*   Liver Function Tests:  Recent Labs Lab 11/01/16 1315  AST 18  ALT 15*  ALKPHOS 66  BILITOT 0.2*  PROT 4.6*  ALBUMIN 2.7*   No results for input(s): LIPASE, AMYLASE in the last 168 hours. No results for input(s): AMMONIA in the last 168 hours. CBC:  Recent Labs Lab 11/01/16 1249 11/01/16 1315 11/02/16 0144 11/03/16 0321 11/04/16 0241  WBC  --  7.5 6.0 5.4 5.8  NEUTROABS  --  6.4  --   --   --   HGB 6.6* 6.8* 9.0* 8.3* 9.3*  HCT  --  20.7* 27.5* 24.8* 28.8*  MCV  --  111.9* 97.5 96.9 98.6  PLT  --  136* 123* 117* 137*   Cardiac Enzymes:  Recent Labs Lab 11/02/16 0144  TROPONINI <0.03   BNP: Invalid input(s): POCBNP CBG: No results for input(s): GLUCAP in the last 168 hours. D-Dimer No results for input(s): DDIMER in the last 72 hours. Hgb A1c No results for input(s): HGBA1C in the last 72 hours. Lipid Profile No results for input(s): CHOL, HDL, LDLCALC, TRIG, CHOLHDL, LDLDIRECT in the last 72 hours. Thyroid function studies No results for input(s): TSH, T4TOTAL, T3FREE, THYROIDAB in the last 72 hours.  Invalid input(s): FREET3 Anemia work up  Recent Labs  11/01/16 1242  FERRITIN 588*  TIBC 182*  IRON 115   Urinalysis    Component Value Date/Time   COLORURINE YELLOW 08/21/2016 1806   APPEARANCEUR CLEAR 08/21/2016 1806   LABSPEC 1.015 08/21/2016  1806   PHURINE 8.0 08/21/2016 1806   GLUCOSEU NEGATIVE 08/21/2016 1806   GLUCOSEU NEGATIVE 07/26/2013 1142   HGBUR NEGATIVE 08/21/2016 1806   HGBUR trace-intact 11/13/2008 0000   BILIRUBINUR NEGATIVE 08/21/2016 1806   BILIRUBINUR neg 05/16/2012 1300   KETONESUR NEGATIVE 08/21/2016 1806   PROTEINUR 30 (A) 08/21/2016 1806   UROBILINOGEN 0.2 03/25/2014 1637   NITRITE NEGATIVE 08/21/2016 1806   LEUKOCYTESUR NEGATIVE 08/21/2016 1806   Sepsis Labs Invalid input(s): PROCALCITONIN,  WBC,  LACTICIDVEN Microbiology No results found for this or any previous visit (from the past 240 hour(s)).   Time coordinating discharge: Over 30 minutes  SIGNED:   Birdie Hopes, MD  [facility] 11/04/2016, 10:08 AM Pager   If 7PM-7AM, please contact night-coverage [email or web address] Password [email or web address]

## 2016-11-04 NOTE — Care Management Note (Signed)
Case Management Note Marvetta Gibbons RN, BSN Unit 2W-Case Manager [phone]  Patient Details  Name: Richard [name] MRN: [id number] Date of Birth: Feb 03, 1936  Subjective/Objective:    Pt admitted with symptomatic anemia- hgb 6.8                Action/Plan: PTA pt lived at home with wife- anticipate return home- CM to follow for potential d/c needs  Expected Discharge Date:  11/04/16               Expected Discharge Plan:  Home/Self Care  In-House Referral:     Discharge planning Services  CM Consult  Post Acute Care Choice:    Choice offered to:     DME Arranged:    DME Agency:     HH Arranged:    [place] Agency:     Status of Service:  Completed, signed off  If discussed at [place] of Stay Meetings, dates discussed:    Discharge Disposition: home/self care  Additional Comments:  11/04/16- 0950- Marvetta Gibbons RN, CM- pt for d/c home today- no CM needs noted.   Dawayne Patricia, RN 11/04/2016, 9:54 AM

## 2016-11-04 NOTE — Care Management Important Message (Signed)
Important Message  Patient Details  Name: Richard [name] MRN: [id number] Date of Birth: 1935-10-31   Medicare Important Message Given:  Yes    Nathen May 11/04/2016, 10:58 AM

## 2016-11-08 ENCOUNTER — Encounter (HOSPITAL_COMMUNITY)
Admission: RE | Admit: 2016-11-08 | Discharge: 2016-11-08 | Disposition: A | Discharge status: Home / Self Care | Payer: Medicare Other | Source: Ambulatory Visit | Attending: Nephrology | Admitting: Nephrology

## 2016-11-08 ENCOUNTER — Telehealth: Payer: Self-pay

## 2016-11-08 DIAGNOSIS — Z79899 Other long term (current) drug therapy: Secondary | ICD-10-CM | POA: Diagnosis not present

## 2016-11-08 DIAGNOSIS — Z5181 Encounter for therapeutic drug level monitoring: Secondary | ICD-10-CM | POA: Diagnosis not present

## 2016-11-08 DIAGNOSIS — N183 Chronic kidney disease, stage 3 (moderate): Secondary | ICD-10-CM | POA: Insufficient documentation

## 2016-11-08 DIAGNOSIS — D631 Anemia in chronic kidney disease: Secondary | ICD-10-CM | POA: Insufficient documentation

## 2016-11-08 DIAGNOSIS — N184 Chronic kidney disease, stage 4 (severe): Secondary | ICD-10-CM

## 2016-11-08 LAB — POCT HEMOGLOBIN-HEMACUE: Hemoglobin: 9.3 g/dL — ABNORMAL LOW (ref 13.0–17.0)

## 2016-11-08 MED ORDER — EPOETIN ALFA 20000 UNIT/ML IJ SOLN
INTRAMUSCULAR | Status: AC
  Administered 2016-11-08: 20000 [IU] via SUBCUTANEOUS
  Filled 2016-11-08: qty 1

## 2016-11-08 MED ORDER — EPOETIN ALFA 10000 UNIT/ML IJ SOLN
INTRAMUSCULAR | Status: AC
  Administered 2016-11-08: 13:00:00 10000 [IU] via SUBCUTANEOUS
  Filled 2016-11-08: qty 1

## 2016-11-08 MED ORDER — EPOETIN ALFA 40000 UNIT/ML IJ SOLN: 30000.0000 [IU] | INTRAMUSCULAR | Status: DC

## 2016-11-08 NOTE — Telephone Encounter (Signed)
D/C 11/04/16 To: home  Spoke with pt and he states that he is improving. He is still weak. He did have a bowel movement this past Friday and it was still very dark in color, however he states he was advised by GI that this would be normal for the first few times. He voiced understanding to his medication changes. No other questions or concerns.   Appt scheduled with Dr. Sarajane Jews 11/18/16, pt aware.   Transition Care Management Follow-up Telephone Call  How have you been since you were released from the hospital? improving   Do you understand why you were in the hospital? yes   Do you understand the discharge instructions? yes  Items Reviewed:  Medications reviewed: yes  Allergies reviewed: yes  Dietary changes reviewed: yes  Referrals reviewed: yes   Functional Questionnaire:   Activities of Daily Living (ADLs):   He states they are independent in the following: ambulation, bathing and hygiene, feeding, continence, grooming, toileting and dressing States they require assistance with the following: none   Any transportation issues/concerns?: no   Any patient concerns? no   Confirmed importance and date/time of follow-up visits scheduled: yes   Confirmed with patient if condition begins to worsen call PCP or go to the ER.  Patient was given the Call-a-Nurse line [phone]: yes

## 2016-11-14 ENCOUNTER — Encounter: Payer: Self-pay | Admitting: Internal Medicine

## 2016-11-14 ENCOUNTER — Ambulatory Visit (INDEPENDENT_AMBULATORY_CARE_PROVIDER_SITE_OTHER): Payer: Medicare Other | Admitting: Internal Medicine

## 2016-11-14 VITALS — BP 123/75 | HR 101 | Ht 67.0 in | Wt 118.0 lb

## 2016-11-14 DIAGNOSIS — I350 Nonrheumatic aortic (valve) stenosis: Secondary | ICD-10-CM

## 2016-11-14 DIAGNOSIS — E43 Unspecified severe protein-calorie malnutrition: Secondary | ICD-10-CM

## 2016-11-14 DIAGNOSIS — M3130 Wegener's granulomatosis without renal involvement: Secondary | ICD-10-CM

## 2016-11-14 DIAGNOSIS — I2510 Atherosclerotic heart disease of native coronary artery without angina pectoris: Secondary | ICD-10-CM

## 2016-11-14 DIAGNOSIS — I10 Essential (primary) hypertension: Secondary | ICD-10-CM | POA: Diagnosis not present

## 2016-11-14 NOTE — Patient Instructions (Signed)

## 2016-11-14 NOTE — Progress Notes (Signed)
OFFICE NOTE  Chief Complaint:  Follow-up hospitalization  Primary Care Physician: Alysia Penna, MD  HPI:  Richard [name] is a 81 y.o. male here for follow up after complex hospitalization, with a history of hypothyroidism, HLD, anemia, Wegener's granulomatosis and no prior cardiac disease who presented to Hospital with chest pain 03/13/14, + nuc study and cardiac cath 03/14/14 which revealed severe multivessel CAD involving all 4 major branches. Seen by Dr. Madolyn Frieze who deemed him unsuitable for CABG due to underlying comorbid conditions. ( sts score >30). It was elected to proceed with staged multivessel PCI . On 03/17/14 he had successful PCI to the LAD and POBA to a chronically occluded LCX. There remained high grade proximal disease to a large proximal Ramus branch as well as mid-RCA disease so staged PCI was planned at a later date due to pulmonary issues and renal dysfunction. This was further complicated by right femoral artery pseudoaneurysm requiring repair. PCI on 03/21/14 with rotablator atherectomy with BMS to the mid RCA. Due to length of fluoro time and large volume of contrast, it was decided to medically manage the ramus stenosis which Dr. Angelena Form felt was moderately stenosed and if he has recurrent anginal then consider PCI.   He recently was seen by one of our nurse practitioners who has been working on him to evaluate his anemia. He was also noted to be hyperkalemic. This is required for treatment with Kayexalate and dietary changes. Unfortunate dietary changes as caused him to lose a significant amount of weight. The family is now concerned about what he can do in order to keep his potassium down. He does have stage III chronic kidney disease.   Richard [name] returns today in follow-up. He is been followed by Dr. Jimmy Footman for stage IV kidney disease secondary to Wegener's granulomatosis. He has developed worsening renal function and has a high ANCA titer. He underwent renal biopsy 2  days ago with the thought that he may be able to be switched from Cytoxan over to rituximab. From a cardiovascular standpoint he reports some improvement in his breathing however energy level is still not great. He was doing somewhat better up until his biopsy but he felt like he took somewhat of a step back after that. He is currently holding his antiplatelets but will resume them tomorrow.  I the pleasure see Richard [name] back in the office today. He was recently seen by Dr. Claiborne Billings last week for worsening shortness of breath. Dr. Claiborne Billings felt that this could be worsening angina and ordered a stress test and echocardiogram, additionally he increase his metoprolol from 12.5-25 mg daily and added imdur 30 mg daily. Richard [name] reports that he does not feel any different today than he did last week. He underwent a nuclear stress test which was negative for ischemia. The study was non-gated and therefore an echocardiogram was obtained. The echo demonstrates an improved EF to 60-65%, previously between 40 and 45%. This suggests that his recent revascularization is actually led to some myocardial improvement. He seems to be tolerating the increased dose of beta blocker however blood pressure is low normal today and heart rate is stable. He recently was found to have worsening anemia and has been referred to Dr. Fuller Plan for evaluation of possible acute blood loss.  Richard [name] returns again for follow-up. He recently saw Dr. Jannette Spanner kidney. He was noted to have worsening shortness of breath. It was not thought that this is related to his renal disease but  more likely worsening pulmonary function. Richard [name] is previously on oxygen but was taken off of it. It may be that he needs to go back on that. He also has a history of cardiomyopathy which improved after percutaneous intervention. I'm wondering if he's developed any worsening LV dysfunction to explain his worsening shortness of breath. He also has had more  productive cough with sputum, more likely related to worsening Wegener's disease.  07/07/2016  Richard [name] was seen back in follow-up today. In general his main complaints have to do with worsening shortness of breath. He feels that this is likely related to his pulmonary disease, however he has had cardiomyopathy in the past. His EF has improved recently after intervention. He is due for reassessment. He is known to have moderate aortic stenosis. It is not clear whether his candidate for TAVR, but most certainly is not a candidate for traditional aortic valve replacement due to his pulmonary and renal disease. Blood pressure is well-controlled today. He denies any worsening chest pain.  11/14/2016  Richard [name] returns from recent hospitalization. Unfortunately presented with acute GI bleeding. He had melenic stools and hemoglobin decreased to 6.8. He was transfused and his Plavix was discontinued. He was also off aspirin but just recently restarted it. He's had no further dark stools. He has a history of some angiodysplasia in the past. Of course he has Wegener's and is on steroids, all of which increases risk of bleeding. Weight had decreased significantly down to about 110 pounds but his weight is starting to come up and now is 118 pounds. I agree with recommendations from his nephrologist and liberalizing his diet to whatever he feels like he can eat just add calories make sense. He understands it being significantly underweight is a very negative cardiovascular risk factor.  PMHx:  Past Medical History:  Diagnosis Date  . Adenomatous colon polyp   . Anemia   . Anemia   . Asthma   . BPH (benign prostatic hyperplasia)    sees Dr. Risa Grill, biopsy June 2015 was benign   . Bronchiectasis ([place])   . CAD (coronary artery disease)    a. [place] s/p Brinckerhoff x3 and ultimately BMS to pLAD (vision BMS 3.0 x 18), POBA to CTO of Cx-OM 03/17/14 and staged Rotablator-PCI w/ BMS (Mini Vision 2.5 x 28) to mRCA on 03/21/14   . Chronic bronchitis ([place])   . COPD (chronic obstructive pulmonary disease) ([place])   . Diverticulosis   . Femoral artery pseudo-aneurysm, right ([place])    a. s/p repair. Follow up with Dr. Kellie Simmering   . Fungal infection    lungs  . GERD (gastroesophageal reflux disease)   . Hiatal hernia   . History of blood transfusion 07/2016; 11/01/2016   "low blood; low blood"  . History of kidney stones   . HOH (hard of hearing)    bilaterally  . HTN (hypertension) 09/28/2013  . Moderate aortic stenosis by prior echocardiogram 07/08/2016   Echo 10/2015: Now Moderate AD (AVA ~1.2 cm) EF 60-65%. GR 1 DD. Echo 11/03/16:EF 60-65% with moderate LVH. Only noted grade 1 diastolic dysfunction. Moderate AS with moderate calcification and thickening. (AVA ~1.2-1.3 cm)  . Peptic stricture of esophagus   . Pneumonia    "1-2 times" (11/01/2016)  . Wegener's granulomatosis ([place])    sees Dr. [name] ; "went from my lungs to my kidneys" (11/01/2016)    Past Surgical History:  Procedure Laterality Date  . CARDIAC CATHETERIZATION  03/17/2014   Procedure: CORONARY BALLOON  ANGIOPLASTY;  Surgeon: Troy Sine, MD;  Location: [facility] CATH LAB;  Service: Cardiovascular;;  . CATARACT EXTRACTION W/ INTRAOCULAR LENS  IMPLANT, BILATERAL  12/18/2012  . CHOLECYSTECTOMY N/A 12/21/2012   Procedure: LAPAROSCOPIC CHOLECYSTECTOMY WITH INTRAOPERATIVE CHOLANGIOGRAM;  Surgeon: Edward Jolly, MD;  Location: [facility] ORS;  Service: General;  Laterality: N/A;  . COLONOSCOPY  08-16-11   per Dr. Fuller Plan, diverticulosis and polyps, repeat in 5 yrs   . COLONOSCOPY N/A 08/01/2016   Procedure: COLONOSCOPY;  Surgeon: Carol Ada, MD;  Location: [facility] ENDOSCOPY;  Service: Endoscopy;  Laterality: N/A;  . ESOPHAGOGASTRODUODENOSCOPY N/A 07/31/2016   Procedure: ESOPHAGOGASTRODUODENOSCOPY (EGD);  Surgeon: Carol Ada, MD;  Location: [facility];  Service: Endoscopy;  Laterality: N/A;  . ESOPHAGOGASTRODUODENOSCOPY (EGD) WITH ESOPHAGEAL DILATION  11-29-10   per Dr.  Fuller Plan   . HEMATOMA EVACUATION Right 03/19/2014   Procedure: Suture repair of femoral artery with evacuation of hematoma;  Surgeon: Mal Misty, MD;  Location: [facility];  Service: Vascular;  Laterality: Right;  . INGUINAL HERNIA REPAIR Right ~ 2002  . LEFT HEART CATHETERIZATION WITH CORONARY ANGIOGRAM N/A 03/14/2014   Procedure: LEFT HEART CATHETERIZATION WITH CORONARY ANGIOGRAM;  Surgeon: Leonie Man, MD;  Location: [facility] CATH LAB: pLAD 90% calcified lesion --> after D1, LAD is Subtotally occluded. mCx 100% (L-L collaterals). pRI 60-80%. mRCA diffuse 80-95%.  Marland Kitchen LUNG BIOPSY  03/2014  . PERCUTANEOUS CORONARY STENT INTERVENTION (PCI-S) N/A 03/17/2014   Procedure: PERCUTANEOUS CORONARY STENT INTERVENTION (PCI-S);  Surgeon: Troy Sine, MD;  Location: [facility] CATH LAB: Vision BMS 3.0 mm x 18 mm -- 3.2 mm); PTCA of CTO Cx-OM  . PERCUTANEOUS CORONARY STENT INTERVENTION (PCI-S) N/A 03/21/2014   Procedure: PERCUTANEOUS CORONARY STENT INTERVENTION (PCI-S);  Surgeon: Burnell Blanks, MD;  Location: [facility] CATH LAB:  Rotational Atherectomy -- Mini Trek BMS 2.5 x 28 mm --> 2.75 mm)  . RENAL BIOPSY    . TRANSTHORACIC ECHOCARDIOGRAM  10/2015; 10/2016   a. Now Moderate AD (AVA ~1.2 cm) EF 60-65%. GR 1 DD.;; b. Stable -- EF 60-65% with moderate LVH. Only noted grade 1 diastolic dysfunction. Moderate aortic stenosis with moderate calcification and thickening. (Estimated valve area between 1.2-1.3 cm)    FAMHx:  Family History  Problem Relation Age of Onset  . Asthma Father   . Coronary artery disease Mother   . Coronary artery disease Brother   . Coronary artery disease Brother   . Colon cancer Neg Hx   . Stomach cancer Neg Hx     SOCHx:   reports that he has never smoked. He has never used smokeless tobacco. He reports that he does not drink alcohol or use drugs.  ALLERGIES:  No Known Allergies  ROS: Pertinent items noted in HPI and remainder of comprehensive ROS otherwise negative.  HOME  MEDS: Current Outpatient Prescriptions  Medication Sig Dispense Refill  . Acetaminophen (TYLENOL) 325 MG CAPS Take 325 mg by mouth every 6 (six) hours as needed (pain). TAKE PER BOX AS NEEDED FOR PAIN     . albuterol (VENTOLIN HFA) 108 (90 Base) MCG/ACT inhaler Inhale 1-2 puffs into the lungs every 4 (four) hours as needed for wheezing or shortness of breath. 1 Inhaler 2  . b complex vitamins tablet Take 1 tablet by mouth daily.      . bisacodyl (DULCOLAX) 5 MG EC tablet Take 5 mg by mouth 2 (two) times daily.     . calcitRIOL (ROCALTROL) 0.25 MCG capsule Take 0.25 mcg by mouth daily.  4  . cyclophosphamide (CYTOXAN) 25 MG capsule Take 25 mg by mouth daily. Take one capsule by mouth daily on an empty stomach 1 hour before or 2 hours after meal.  0  . dextromethorphan-guaiFENesin (MUCINEX DM) 30-600 MG 12hr tablet Take 1 tablet by mouth 2 (two) times daily.    . finasteride (PROSCAR) 5 MG tablet TAKE ONE TABLET BY MOUTH ONCE DAILY 90 tablet 0  . guaiFENesin-codeine 100-10 MG/5ML syrup Take 10 mLs by mouth every 6 (six) hours as needed for cough. 120 mL 0  . ipratropium-albuterol (DUONEB) 0.5-2.5 (3) MG/3ML SOLN Take 3 mLs by nebulization every 4 (four) hours as needed (if inhaler is not effective in resolving shortness of breath or wheezing). (Patient taking differently: Take 3 mLs by nebulization every 4 (four) hours as needed (WITH FLUTTER VALVE FOR WHEEZING, SHORTNESS OF BREATH-PLAN C). ) 360 mL 11  . loratadine (CLARITIN) 10 MG tablet Take 10 mg by mouth daily.     . Methylcellulose, Laxative, (CITRUCEL PO) Take 1 packet by mouth daily.    . metoprolol succinate (TOPROL-XL) 25 MG 24 hr tablet Take 1 tablet (25 mg total) by mouth daily. 90 tablet 1  . nitroGLYCERIN (NITROSTAT) 0.4 MG SL tablet Place 1 tablet (0.4 mg total) under the tongue every 5 (five) minutes x 3 doses as needed for chest pain. 25 tablet 12  . pantoprazole (PROTONIX) 40 MG tablet Take 1 tablet (40 mg total) by mouth daily. 30  tablet 0  . predniSONE (DELTASONE) 5 MG tablet Take 7.5 mg by mouth daily with breakfast.    . simethicone (MYLICON) 160 MG chewable tablet Chew 125 mg by mouth every 6 (six) hours as needed for flatulence. TAKE PER BOX FOR GAS     . simvastatin (ZOCOR) 20 MG tablet TAKE ONE TABLET BY MOUTH ONCE DAILY AT  6  PM 90 tablet 3  . sodium bicarbonate 650 MG tablet Take 2 tablets (1,300 mg total) by mouth 2 (two) times daily. NEED OV. 120 tablet 1  . sodium chloride (OCEAN) 0.65 % SOLN nasal spray Place 1 spray into both nostrils as needed for congestion.    . SYMBICORT 160-4.5 MCG/ACT inhaler INHALE TWO PUFFS BY MOUTH TWICE DAILY 3 Inhaler 3  . tamsulosin (FLOMAX) 0.4 MG CAPS capsule TAKE ONE CAPSULE BY MOUTH ONCE DAILY IN THE EVENING 30 capsule 5  . traMADol (ULTRAM) 50 MG tablet 1-2 every 4 hours as needed for cough or pain (Patient taking differently: Take 50 mg by mouth every 4 (four) hours as needed for moderate pain. ) 40 tablet 0   No current facility-administered medications for this visit.     LABS/IMAGING: No results found for this or any previous visit (from the past 48 hour(s)). No results found.  VITALS: BP 123/75   Pulse (!) 101   Ht 5\' 7"  (1.702 m)   Wt 118 lb (53.5 kg)   SpO2 96%   BMI 18.48 kg/m   EXAM: General appearance: alert, appears older than stated age, cachectic and no distress Neck: no carotid bruit and no JVD Lungs: diminished breath sounds bilaterally and rhonchi bilaterally Heart: regular rate and rhythm, S1, S2 normal and systolic murmur: midsystolic 3/6, crescendo at 2nd right intercostal space Abdomen: soft, non-tender; bowel sounds normal; no masses,  no organomegaly Extremities: extremities normal, atraumatic, no cyanosis or edema Pulses: 2+ and symmetric Skin: Pale, cool skin Neurologic: Grossly normal Psych: Normal  EKG: Deferred  ASSESSMENT: 1. Recent GIB- taken off plavix 2.  Aortic stenosis 3. History of Wegener's granulomatosis on  immunosuppression 4. Multivessel CAD s/p PCI to the LAD, LCX and RCA - recent low risk nuclear stress test without ischemia and a small fixed defect concerning for scar  5. Stage III CKD 6. Hyperkalemia 7. Ischemic cardiomyopathy, EF 40% -> Now 60-65% 7.   Anemia  PLAN: 1.   Richard [name] developed acute anemia was hospitalized for GI bleeding. His Plavix was discontinued and he was transfused. Hemoglobin is remained stable in his stool is now cleared up. Recent echo shows EF 60-65% which is stable however there is moderate aortic stenosis. We'll need to follow this closely as it's likely to get worse. He will continue on low-dose aspirin for his coronary disease.  Follow-up in 6 months.  Pixie Casino, MD, [facility] Attending Cardiologist Moore C Baylyn Sickles 11/14/2016, 3:54 PM

## 2016-11-15 ENCOUNTER — Encounter (HOSPITAL_COMMUNITY)
Admission: RE | Admit: 2016-11-15 | Discharge: 2016-11-15 | Disposition: A | Discharge status: Home / Self Care | Payer: Medicare Other | Source: Ambulatory Visit | Attending: Nephrology | Admitting: Nephrology

## 2016-11-15 DIAGNOSIS — N184 Chronic kidney disease, stage 4 (severe): Secondary | ICD-10-CM

## 2016-11-15 DIAGNOSIS — N183 Chronic kidney disease, stage 3 (moderate): Secondary | ICD-10-CM | POA: Diagnosis not present

## 2016-11-15 LAB — POCT HEMOGLOBIN-HEMACUE: Hemoglobin: 10.5 g/dL — ABNORMAL LOW (ref 13.0–17.0)

## 2016-11-15 MED ORDER — EPOETIN ALFA 40000 UNIT/ML IJ SOLN: 30000.0000 [IU] | INTRAMUSCULAR | Status: DC

## 2016-11-15 MED ORDER — EPOETIN ALFA 20000 UNIT/ML IJ SOLN
INTRAMUSCULAR | Status: AC
  Administered 2016-11-15: 13:00:00 20000 [IU] via SUBCUTANEOUS
  Filled 2016-11-15: qty 1

## 2016-11-15 MED ORDER — EPOETIN ALFA 10000 UNIT/ML IJ SOLN
INTRAMUSCULAR | Status: AC
  Administered 2016-11-15: 13:00:00 10000 [IU] via SUBCUTANEOUS
  Filled 2016-11-15: qty 1

## 2016-11-18 ENCOUNTER — Encounter: Payer: Self-pay | Admitting: Family Medicine

## 2016-11-18 ENCOUNTER — Ambulatory Visit (INDEPENDENT_AMBULATORY_CARE_PROVIDER_SITE_OTHER): Payer: Medicare Other | Admitting: Family Medicine

## 2016-11-18 VITALS — BP 121/72 | HR 88 | Temp 98.4°F | Ht 67.0 in | Wt 114.0 lb

## 2016-11-18 DIAGNOSIS — I350 Nonrheumatic aortic (valve) stenosis: Secondary | ICD-10-CM

## 2016-11-18 DIAGNOSIS — K921 Melena: Secondary | ICD-10-CM | POA: Diagnosis not present

## 2016-11-18 DIAGNOSIS — N184 Chronic kidney disease, stage 4 (severe): Secondary | ICD-10-CM

## 2016-11-18 DIAGNOSIS — I10 Essential (primary) hypertension: Secondary | ICD-10-CM | POA: Diagnosis not present

## 2016-11-18 DIAGNOSIS — J9611 Chronic respiratory failure with hypoxia: Secondary | ICD-10-CM

## 2016-11-18 MED ORDER — RANITIDINE HCL 300 MG PO TABS: 300.0000 mg | ORAL_TABLET | Freq: Two times a day (BID) | ORAL | 0 refills | Status: AC

## 2016-11-18 MED ORDER — FINASTERIDE 5 MG PO TABS: 5.0000 mg | ORAL_TABLET | Freq: Every day | ORAL | 3 refills | Status: AC

## 2016-11-18 NOTE — Patient Instructions (Signed)
WE NOW OFFER   [facility] FAST TRACK!!!  SAME DAY Appointments for ACUTE CARE  Such as: Sprains, Injuries, cuts, abrasions, rashes, muscle pain, joint pain, back pain Colds, flu, sore throats, headache, allergies, cough, fever  Ear pain, sinus and eye infections Abdominal pain, nausea, vomiting, diarrhea, upset stomach Animal/insect bites  3 Easy Ways to Schedule: Walk-In Scheduling Call in scheduling Mychart Sign-up: [email or web address]         

## 2016-11-18 NOTE — Progress Notes (Signed)
Pre visit review using our clinic review tool, if applicable. No additional management support is needed unless otherwise documented below in the visit note. 

## 2016-11-18 NOTE — Progress Notes (Signed)
   Subjective:    Patient ID: Richard [name], male    DOB: 06-Aug-1936, 81 y.o.   MRN: [id number]  HPI Here for a TCM visit to follow up from a hospital stay from 11-01-16 to 11-04-16 for lower GI bleeding and anemia. He had been having melenotic stools for several days, but no diarrhea or fever or abdominal pain. This was felt to be diverticular bleeding but he did not have an acute infection. His admission Hgb was 6.8 and after being transfused 2 units of PRBC this went up to 9.3. On recheck on 11-15-16 this was up to 10.5. He had been taking Plavix and this was stopped. He is now taking only 81 mg of aspirin daily. His stools are back to normal. His renal function was stable with a creatinine of 1.70. He had a repeat ECHO which showed his aortic stenosis to be stable in the moderate range and his EF has increased to 60-65%. His appetite is poor but he is trying to get more nutrition down. He is fatigued but he feels much better than he did. No new complaints today.    Review of Systems  Constitutional: Positive for fatigue.  Respiratory: Positive for shortness of breath. Negative for cough and wheezing.   Cardiovascular: Negative.   Gastrointestinal: Negative.   Neurological: Negative.        Objective:   Physical Exam  Constitutional: He is oriented to person, place, and time.  Frail, alert   Cardiovascular: Normal rate, regular rhythm and intact distal pulses.   4/6 SM over the aortic area   Pulmonary/Chest: Breath sounds normal. He is in respiratory distress. He has no wheezes. He has no rales.  Abdominal: Soft. Bowel sounds are normal. He exhibits no distension and no mass. There is no tenderness. There is no rebound and no guarding.  Neurological: He is alert and oriented to person, place, and time.          Assessment & Plan:  He is recovering from a lower GI bleed. This was probably the result of anticoagulation, so he will stay on aspirin only and will stay off Plavix. His  cardiac and pulmonary and renal status remain stable.  Alysia Penna, MD

## 2016-11-22 ENCOUNTER — Encounter (HOSPITAL_COMMUNITY)
Admission: RE | Admit: 2016-11-22 | Discharge: 2016-11-22 | Disposition: A | Discharge status: Home / Self Care | Payer: Medicare Other | Source: Ambulatory Visit | Attending: Nephrology | Admitting: Nephrology

## 2016-11-22 DIAGNOSIS — N184 Chronic kidney disease, stage 4 (severe): Secondary | ICD-10-CM

## 2016-11-22 DIAGNOSIS — N183 Chronic kidney disease, stage 3 (moderate): Secondary | ICD-10-CM | POA: Diagnosis not present

## 2016-11-22 LAB — POCT HEMOGLOBIN-HEMACUE: HEMOGLOBIN: 10.8 g/dL — AB (ref 13.0–17.0)

## 2016-11-22 MED ORDER — EPOETIN ALFA 20000 UNIT/ML IJ SOLN
INTRAMUSCULAR | Status: AC
  Administered 2016-11-22: 13:00:00 20000 [IU] via SUBCUTANEOUS
  Filled 2016-11-22: qty 1

## 2016-11-22 MED ORDER — EPOETIN ALFA 40000 UNIT/ML IJ SOLN: 30000.0000 [IU] | INTRAMUSCULAR | Status: DC

## 2016-11-22 MED ORDER — EPOETIN ALFA 10000 UNIT/ML IJ SOLN
INTRAMUSCULAR | Status: AC
  Administered 2016-11-22: 13:00:00 10000 [IU] via SUBCUTANEOUS
  Filled 2016-11-22: qty 1

## 2016-11-29 ENCOUNTER — Encounter (HOSPITAL_COMMUNITY)
Admission: RE | Admit: 2016-11-29 | Discharge: 2016-11-29 | Disposition: A | Discharge status: Home / Self Care | Payer: Medicare Other | Source: Ambulatory Visit | Attending: Nephrology | Admitting: Nephrology

## 2016-11-29 DIAGNOSIS — N184 Chronic kidney disease, stage 4 (severe): Secondary | ICD-10-CM

## 2016-11-29 DIAGNOSIS — N183 Chronic kidney disease, stage 3 (moderate): Secondary | ICD-10-CM | POA: Diagnosis not present

## 2016-11-29 LAB — POCT HEMOGLOBIN-HEMACUE: HEMOGLOBIN: 11 g/dL — AB (ref 13.0–17.0)

## 2016-11-29 LAB — IRON AND TIBC
Iron: 68 ug/dL (ref 45–182)
SATURATION RATIOS: 34 % (ref 17.9–39.5)
TIBC: 202 ug/dL — AB (ref 250–450)
UIBC: 134 ug/dL

## 2016-11-29 LAB — FERRITIN: FERRITIN: 414 ng/mL — AB (ref 24–336)

## 2016-11-29 MED ORDER — EPOETIN ALFA 20000 UNIT/ML IJ SOLN
INTRAMUSCULAR | Status: AC
Start: 2016-11-29 — End: 2016-11-29
  Administered 2016-11-29: 13:00:00 20000 [IU]
  Filled 2016-11-29: qty 1

## 2016-11-29 MED ORDER — EPOETIN ALFA 40000 UNIT/ML IJ SOLN: 30000.0000 [IU] | INTRAMUSCULAR | Status: DC

## 2016-11-29 MED ORDER — EPOETIN ALFA 10000 UNIT/ML IJ SOLN
INTRAMUSCULAR | Status: AC
  Administered 2016-11-29: 10000 [IU]
  Filled 2016-11-29: qty 1

## 2016-12-02 ENCOUNTER — Encounter (HOSPITAL_COMMUNITY): Payer: Self-pay | Admitting: Obstetrics and Gynecology

## 2016-12-02 ENCOUNTER — Emergency Department (HOSPITAL_COMMUNITY)
Admission: EM | Admit: 2016-12-02 | Discharge: 2016-12-02 | Disposition: A | Discharge status: Home / Self Care | Payer: Medicare Other | Attending: Emergency Medicine | Admitting: Emergency Medicine

## 2016-12-02 ENCOUNTER — Emergency Department (HOSPITAL_COMMUNITY): Payer: Medicare Other

## 2016-12-02 DIAGNOSIS — I2510 Atherosclerotic heart disease of native coronary artery without angina pectoris: Secondary | ICD-10-CM | POA: Diagnosis not present

## 2016-12-02 DIAGNOSIS — Z7982 Long term (current) use of aspirin: Secondary | ICD-10-CM | POA: Insufficient documentation

## 2016-12-02 DIAGNOSIS — Z955 Presence of coronary angioplasty implant and graft: Secondary | ICD-10-CM | POA: Diagnosis not present

## 2016-12-02 DIAGNOSIS — I129 Hypertensive chronic kidney disease with stage 1 through stage 4 chronic kidney disease, or unspecified chronic kidney disease: Secondary | ICD-10-CM | POA: Insufficient documentation

## 2016-12-02 DIAGNOSIS — Z79899 Other long term (current) drug therapy: Secondary | ICD-10-CM | POA: Diagnosis not present

## 2016-12-02 DIAGNOSIS — I77811 Abdominal aortic ectasia: Secondary | ICD-10-CM | POA: Insufficient documentation

## 2016-12-02 DIAGNOSIS — M549 Dorsalgia, unspecified: Secondary | ICD-10-CM | POA: Insufficient documentation

## 2016-12-02 DIAGNOSIS — K449 Diaphragmatic hernia without obstruction or gangrene: Secondary | ICD-10-CM | POA: Diagnosis not present

## 2016-12-02 DIAGNOSIS — N184 Chronic kidney disease, stage 4 (severe): Secondary | ICD-10-CM | POA: Diagnosis not present

## 2016-12-02 DIAGNOSIS — R0781 Pleurodynia: Secondary | ICD-10-CM | POA: Insufficient documentation

## 2016-12-02 DIAGNOSIS — R079 Chest pain, unspecified: Secondary | ICD-10-CM

## 2016-12-02 DIAGNOSIS — J449 Chronic obstructive pulmonary disease, unspecified: Secondary | ICD-10-CM | POA: Diagnosis not present

## 2016-12-02 LAB — COMPREHENSIVE METABOLIC PANEL
ALBUMIN: 3.3 g/dL — AB (ref 3.5–5.0)
ALK PHOS: 90 U/L (ref 38–126)
ALT: 12 U/L — AB (ref 17–63)
AST: 17 U/L (ref 15–41)
Anion gap: 8 (ref 5–15)
BUN: 26 mg/dL — AB (ref 6–20)
CALCIUM: 8.4 mg/dL — AB (ref 8.9–10.3)
CO2: 26 mmol/L (ref 22–32)
Chloride: 105 mmol/L (ref 101–111)
Creatinine, Ser: 1.63 mg/dL — ABNORMAL HIGH (ref 0.61–1.24)
GFR calc Af Amer: 45 mL/min — ABNORMAL LOW (ref >60)
GFR calc non Af Amer: 39 mL/min — ABNORMAL LOW (ref >60)
GLUCOSE: 102 mg/dL — AB (ref 65–99)
Potassium: 4.9 mmol/L (ref 3.5–5.1)
SODIUM: 139 mmol/L (ref 135–145)
TOTAL PROTEIN: 5.8 g/dL — AB (ref 6.5–8.1)
Total Bilirubin: 0.6 mg/dL (ref 0.3–1.2)

## 2016-12-02 LAB — CBC WITH DIFFERENTIAL/PLATELET
BASOS ABS: 0 10*3/uL (ref 0.0–0.1)
BASOS PCT: 0 %
EOS ABS: 0 10*3/uL (ref 0.0–0.7)
EOS PCT: 0 %
HCT: 36.9 % — ABNORMAL LOW (ref 39.0–52.0)
Hemoglobin: 11.9 g/dL — ABNORMAL LOW (ref 13.0–17.0)
Lymphocytes Relative: 5 %
Lymphs Abs: 0.5 10*3/uL — ABNORMAL LOW (ref 0.7–4.0)
MCH: 35 pg — ABNORMAL HIGH (ref 26.0–34.0)
MCHC: 32.2 g/dL (ref 30.0–36.0)
MCV: 108.5 fL — ABNORMAL HIGH (ref 78.0–100.0)
MONO ABS: 0.4 10*3/uL (ref 0.1–1.0)
Monocytes Relative: 5 %
Neutro Abs: 8.5 10*3/uL — ABNORMAL HIGH (ref 1.7–7.7)
Neutrophils Relative %: 90 %
PLATELETS: 128 10*3/uL — AB (ref 150–400)
RBC: 3.4 MIL/uL — AB (ref 4.22–5.81)
RDW: 20 % — AB (ref 11.5–15.5)
WBC: 9.4 10*3/uL (ref 4.0–10.5)

## 2016-12-02 LAB — URINALYSIS, ROUTINE W REFLEX MICROSCOPIC
Bilirubin Urine: NEGATIVE
GLUCOSE, UA: NEGATIVE mg/dL
Hgb urine dipstick: NEGATIVE
KETONES UR: NEGATIVE mg/dL
LEUKOCYTES UA: NEGATIVE
NITRITE: NEGATIVE
PROTEIN: NEGATIVE mg/dL
Specific Gravity, Urine: 1.014 (ref 1.005–1.030)
pH: 7 (ref 5.0–8.0)

## 2016-12-02 MED ORDER — HYDROCODONE-ACETAMINOPHEN 5-325 MG PO TABS
1.0000 | ORAL_TABLET | Freq: Once | ORAL | Status: AC
  Administered 2016-12-02: 1 via ORAL
  Filled 2016-12-02: qty 1

## 2016-12-02 MED ORDER — IOPAMIDOL (ISOVUE-300) INJECTION 61%
INTRAVENOUS | Status: AC
  Administered 2016-12-02: 30 mL via INTRAVENOUS
  Filled 2016-12-02: qty 30

## 2016-12-02 MED ORDER — ONDANSETRON 4 MG PO TBDP
4.0000 mg | ORAL_TABLET | Freq: Once | ORAL | Status: AC
  Administered 2016-12-02: 4 mg via ORAL
  Filled 2016-12-02: qty 1

## 2016-12-02 MED ORDER — ONDANSETRON HCL 4 MG PO TABS: 4.0000 mg | ORAL_TABLET | Freq: Three times a day (TID) | ORAL | 0 refills | Status: DC | PRN

## 2016-12-02 MED ORDER — HYDROCODONE-ACETAMINOPHEN 5-325 MG PO TABS: 0.5000 | ORAL_TABLET | ORAL | 0 refills | Status: DC | PRN

## 2016-12-02 NOTE — ED Notes (Signed)
ED Provider at bedside. 

## 2016-12-02 NOTE — ED Provider Notes (Signed)
[facility] DEPT Provider Note   CSN: [id number] Arrival date & time: 12/02/16  1613     History   Chief Complaint Chief Complaint  Patient presents with  . Back Pain    HPI Richard [name] is a 81 y.o. 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 He also has a history of AVM of the colon with acute blood loss anemia.,The patient is on immunosuppressive therapy. He complains of pain in the bilateral back, radiating to the abdomen. Patient states that he was doing some heavy lifting yesterday. This morning he reached down to pull his pants on and had sudden pain in the bilateral posterior rib cage radiating around the region of his diaphragm into the front of his abdomen. He states at the time the pain was 10 out of 10, worse with breathing and coughing. He states he took a tramadol and it helped relieve some and he is now has about 5 out of 10 pain. He has a standing order for Levaquin and takes Levaquin and he feels he might be getting pneumonia. He completed a course of this on this past Monday. He does have a wet sounding, productive cough, which has been ongoing and is why he treated himself with Levaquin. He denies fevers, chills, constipation, diarrhea, vomiting. He denies hemoptysis.  HPI  Past Medical History:  Diagnosis Date  . Adenomatous colon polyp   . Anemia   . Anemia   . Asthma   . BPH (benign prostatic hyperplasia)    sees Dr. Risa Grill, biopsy June 2015 was benign   . Bronchiectasis ([place])   . CAD (coronary artery disease)    a. [place] s/p [place] x3 and ultimately BMS to pLAD (vision BMS 3.0 x 18), POBA to CTO of Cx-OM 03/17/14 and staged Rotablator-PCI w/ BMS (Mini Vision 2.5 x 28) to mRCA on 03/21/14  . Chronic bronchitis ([place])   . COPD (chronic obstructive pulmonary disease) ([place])   . Diverticulosis   . Femoral artery pseudo-aneurysm, right ([place])    a. s/p repair. Follow up with  Dr. Kellie Simmering   . Fungal infection    lungs  . GERD (gastroesophageal reflux disease)   . Hiatal hernia   . History of blood transfusion 07/2016; 11/01/2016   "low blood; low blood"  . History of kidney stones   . HOH (hard of hearing)    bilaterally  . HTN (hypertension) 09/28/2013  . Moderate aortic stenosis by prior echocardiogram 07/08/2016   Echo 10/2015: Now Moderate AD (AVA ~1.2 cm) EF 60-65%. GR 1 DD. Echo 11/03/16:EF 60-65% with moderate LVH. Only noted grade 1 diastolic dysfunction. Moderate AS with moderate calcification and thickening. (AVA ~1.2-1.3 cm)  . Peptic stricture of esophagus   . Pneumonia    "1-2 times" (11/01/2016)  . Wegener's granulomatosis ([place])    sees Dr. [name] ; "went from my lungs to my kidneys" (11/01/2016)    Patient Active Problem List   Diagnosis Date Noted  . CAD in native artery 11/14/2016  . Melena 11/01/2016  . Malnutrition of moderate degree 08/22/2016  . Influenza A 08/21/2016  . AVM (arteriovenous malformation) of colon with hemorrhage   . Symptomatic anemia 07/30/2016  . Gastrointestinal hemorrhage with melena 07/30/2016  . Aortic valve stenosis 07/08/2016  . Protein-calorie malnutrition, severe ([place]) 04/21/2015  . CAP (community acquired pneumonia) 04/18/2015  . Chronic kidney disease, stage IV (severe) ([place]) 03/26/2015  . Wegener's granulomatosis with renal  involvement ([place]) 07/15/2014  . Pseudoaneurysm of right femoral artery ([place]) 04/08/2014  . Acute blood loss anemia 03/20/2014  . CAD- severe 4 V CAD- not CABG candidate: BMS-pLAD, mRCA &PTCA of 100% Cx-OM 03/17/2014  . Anemia 03/17/2014  . Cardiomyopathy, ischemic- EF 40% by [facility] 03/17/2014  . Abnormal nuclear stress test: Intermediate risk with moderate region of apical and inferior scar with mild superimposed ischemia; EF 40% 03/12/2014  . Loss of weight 12/12/2013  . Abdominal pain, unspecified site 12/12/2013  . SVT (supraventricular tachycardia) ([place]) 10/04/2013  . SOB  (shortness of breath) 10/04/2013  . Weakness generalized 10/04/2013  . Hypertension 09/28/2013  . Chronic rhinitis 09/27/2013  . Chronic respiratory failure ([place]) 12/26/2012  . Bronchiectasis with acute exacerbation ([place]) 09/28/2011  . OTHER DYSPHAGIA 10/25/2010  . Wegener's granulomatosis ([place]) 05/10/2010  . GERD 03/10/2010  . ARTHRITIS, CERVICAL SPINE 03/10/2010  . Hypothyroidism 11/13/2008  . VITAMIN D DEFICIENCY 11/13/2008  . Hyperlipidemia with target low density lipoprotein (LDL) cholesterol less than 70 mg/dL 11/13/2008  . ANEMIA 11/13/2008  . BPH (benign prostatic hyperplasia) 12/04/2007  . Obstructive bronchiectasis ([place]) 01/25/2007    Past Surgical History:  Procedure Laterality Date  . CARDIAC CATHETERIZATION  03/17/2014   Procedure: CORONARY BALLOON ANGIOPLASTY;  Surgeon: Troy Sine, MD;  Location: [facility] CATH LAB;  Service: Cardiovascular;;  . CATARACT EXTRACTION W/ INTRAOCULAR LENS  IMPLANT, BILATERAL  12/18/2012  . CHOLECYSTECTOMY N/A 12/21/2012   Procedure: LAPAROSCOPIC CHOLECYSTECTOMY WITH INTRAOPERATIVE CHOLANGIOGRAM;  Surgeon: Edward Jolly, MD;  Location: [facility] ORS;  Service: General;  Laterality: N/A;  . COLONOSCOPY  08-16-11   per Dr. Fuller Plan, diverticulosis and polyps, repeat in 5 yrs   . COLONOSCOPY N/A 08/01/2016   Procedure: COLONOSCOPY;  Surgeon: Carol Ada, MD;  Location: [facility] ENDOSCOPY;  Service: Endoscopy;  Laterality: N/A;  . ESOPHAGOGASTRODUODENOSCOPY N/A 07/31/2016   Procedure: ESOPHAGOGASTRODUODENOSCOPY (EGD);  Surgeon: Carol Ada, MD;  Location: [facility] ENDOSCOPY;  Service: Endoscopy;  Laterality: N/A;  . ESOPHAGOGASTRODUODENOSCOPY (EGD) WITH ESOPHAGEAL DILATION  11-29-10   per Dr. Fuller Plan   . HEMATOMA EVACUATION Right 03/19/2014   Procedure: Suture repair of femoral artery with evacuation of hematoma;  Surgeon: Mal Misty, MD;  Location: [facility];  Service: Vascular;  Laterality: Right;  . INGUINAL HERNIA REPAIR Right ~ 2002  . LEFT HEART CATHETERIZATION  WITH CORONARY ANGIOGRAM N/A 03/14/2014   Procedure: LEFT HEART CATHETERIZATION WITH CORONARY ANGIOGRAM;  Surgeon: Leonie Man, MD;  Location: [facility] CATH LAB: pLAD 90% calcified lesion --> after D1, LAD is Subtotally occluded. mCx 100% (L-L collaterals). pRI 60-80%. mRCA diffuse 80-95%.  Marland Kitchen LUNG BIOPSY  03/2014  . PERCUTANEOUS CORONARY STENT INTERVENTION (PCI-S) N/A 03/17/2014   Procedure: PERCUTANEOUS CORONARY STENT INTERVENTION (PCI-S);  Surgeon: Troy Sine, MD;  Location: [facility] CATH LAB: Vision BMS 3.0 mm x 18 mm -- 3.2 mm); PTCA of CTO Cx-OM  . PERCUTANEOUS CORONARY STENT INTERVENTION (PCI-S) N/A 03/21/2014   Procedure: PERCUTANEOUS CORONARY STENT INTERVENTION (PCI-S);  Surgeon: Burnell Blanks, MD;  Location: [facility] CATH LAB:  Rotational Atherectomy -- Mini Trek BMS 2.5 x 28 mm --> 2.75 mm)  . RENAL BIOPSY    . TRANSTHORACIC ECHOCARDIOGRAM  10/2015; 10/2016   a. Now Moderate AD (AVA ~1.2 cm) EF 60-65%. GR 1 DD.;; b. Stable -- EF 60-65% with moderate LVH. Only noted grade 1 diastolic dysfunction. Moderate aortic stenosis with moderate calcification and thickening. (Estimated valve area between 1.2-1.3 cm)       Home Medications  Prior to Admission medications   Medication Sig Start Date End Date Taking? Authorizing Provider  Acetaminophen (TYLENOL) 325 MG CAPS Take 325 mg by mouth every 6 (six) hours as needed (pain). TAKE PER BOX AS NEEDED FOR PAIN    Yes Historical Provider, MD  albuterol (VENTOLIN HFA) 108 (90 Base) MCG/ACT inhaler Inhale 1-2 puffs into the lungs every 4 (four) hours as needed for wheezing or shortness of breath. 07/12/16  Yes Tanda Rockers, MD  aspirin EC 81 MG tablet Take 81 mg by mouth daily.   Yes Historical Provider, MD  b complex vitamins tablet Take 1 tablet by mouth daily.     Yes Historical Provider, MD  bisacodyl (DULCOLAX) 5 MG EC tablet Take 5 mg by mouth 2 (two) times daily.    Yes Historical Provider, MD  cyclophosphamide (CYTOXAN) 25 MG capsule Take 25  mg by mouth daily. Take one capsule by mouth daily on an empty stomach 1 hour before or 2 hours after meal. 07/26/16  Yes Historical Provider, MD  dextromethorphan-guaiFENesin (MUCINEX DM) 30-600 MG 12hr tablet Take 1 tablet by mouth 2 (two) times daily.   Yes Historical Provider, MD  finasteride (PROSCAR) 5 MG tablet Take 1 tablet (5 mg total) by mouth daily. 11/18/16  Yes [name], MD  guaiFENesin-codeine 100-10 MG/5ML syrup Take 10 mLs by mouth every 6 (six) hours as needed for cough. 08/23/16  Yes Eber Jones, MD  ipratropium-albuterol (DUONEB) 0.5-2.5 (3) MG/3ML SOLN Take 3 mLs by nebulization every 4 (four) hours as needed (if inhaler is not effective in resolving shortness of breath or wheezing). Patient taking differently: Take 3 mLs by nebulization every 4 (four) hours as needed (WITH FLUTTER VALVE FOR WHEEZING, SHORTNESS OF BREATH-PLAN C).  02/03/16  Yes Tanda Rockers, MD  loratadine (CLARITIN) 10 MG tablet Take 10 mg by mouth daily.    Yes Historical Provider, MD  Methylcellulose, Laxative, (CITRUCEL PO) Take 1 packet by mouth daily.   Yes Historical Provider, MD  metoprolol succinate (TOPROL-XL) 25 MG 24 hr tablet Take 1 tablet (25 mg total) by mouth daily. 07/18/16  Yes Pixie Casino, MD  nitroGLYCERIN (NITROSTAT) 0.4 MG SL tablet Place 1 tablet (0.4 mg total) under the tongue every 5 (five) minutes x 3 doses as needed for chest pain. 03/24/15  Yes Pixie Casino, MD  pantoprazole (PROTONIX) 40 MG tablet Take 40 mg by mouth daily. 11/04/16  Yes Historical Provider, MD  predniSONE (DELTASONE) 5 MG tablet Take 7.5 mg by mouth daily with breakfast.   Yes Historical Provider, MD  simethicone (MYLICON) 902 MG chewable tablet Chew 250 mg by mouth 2 (two) times daily as needed for flatulence. TAKE PER BOX FOR GAS    Yes Historical Provider, MD  simvastatin (ZOCOR) 20 MG tablet TAKE ONE TABLET BY MOUTH ONCE DAILY AT  6  PM 07/07/16  Yes Pixie Casino, MD  sodium bicarbonate 650 MG  tablet Take 2 tablets (1,300 mg total) by mouth 2 (two) times daily. NEED OV. 08/12/15  Yes Pixie Casino, MD  sodium chloride (OCEAN) 0.65 % SOLN nasal spray Place 1 spray into both nostrils as needed for congestion.   Yes Historical Provider, MD  SYMBICORT 160-4.5 MCG/ACT inhaler INHALE TWO PUFFS BY MOUTH TWICE DAILY 06/01/16  Yes Tanda Rockers, MD  tamsulosin (FLOMAX) 0.4 MG CAPS capsule TAKE ONE CAPSULE BY MOUTH ONCE DAILY IN THE EVENING 07/05/16  Yes [name], MD  traMADol Veatrice [place]) 50  MG tablet 1-2 every 4 hours as needed for cough or pain Patient taking differently: Take 50 mg by mouth every 4 (four) hours as needed for moderate pain.  09/15/16  Yes Tanda Rockers, MD  calcitRIOL (ROCALTROL) 0.25 MCG capsule Take 0.25 mcg by mouth daily.  09/06/15   Historical Provider, MD  ranitidine (ZANTAC) 300 MG tablet Take 1 tablet (300 mg total) by mouth 2 (two) times daily. Patient not taking: Reported on 12/02/2016 11/18/16   [name], MD    Family History Family History  Problem Relation Age of Onset  . Asthma Father   . Coronary artery disease Mother   . Coronary artery disease Brother   . Coronary artery disease Brother   . Colon cancer Neg Hx   . Stomach cancer Neg Hx     Social History Social History  Substance Use Topics  . Smoking status: Never Smoker  . Smokeless tobacco: Never Used  . Alcohol use No     Allergies   Patient has no known allergies.   Review of Systems Review of Systems  Ten systems reviewed and are negative for acute change, except as noted in the HPI.   Physical Exam Updated Vital Signs BP 116/85   Pulse 85   Temp 98.5 F (36.9 C) (Oral)   Resp 16   Ht 5\' 7"  (1.702 m)   Wt 52.2 kg   SpO2 90%   BMI 18.01 kg/m   Physical Exam  Constitutional: He appears well-developed and well-nourished. No distress.  HENT:  Head: Normocephalic and atraumatic.  Eyes: Conjunctivae are normal. No scleral icterus.  Neck: Normal range of motion. Neck  supple.  Cardiovascular: Normal rate, regular rhythm and normal heart sounds.   Pulmonary/Chest: Effort normal. No respiratory distress.  Productive cough  Abdominal: Soft. There is no tenderness.  No abdominal tenderness on palpation  Musculoskeletal: He exhibits no edema.       Back:  TTP BL posterior ribs. No muscular tenderness. No midline spinal tenderness.  Neurological: He is alert.  Skin: Skin is warm and dry. He is not diaphoretic.  Psychiatric: His behavior is normal.  Nursing note and vitals reviewed.    ED Treatments / Results  Labs (all labs ordered are listed, but only abnormal results are displayed) Labs Reviewed  COMPREHENSIVE METABOLIC PANEL - Abnormal; Notable for the following:       Result Value   Glucose, Bld 102 (*)    BUN 26 (*)    Creatinine, Ser 1.63 (*)    Calcium 8.4 (*)    Total Protein 5.8 (*)    Albumin 3.3 (*)    ALT 12 (*)    GFR calc non Af Amer 39 (*)    GFR calc Af Amer 45 (*)    All other components within normal limits  CBC WITH DIFFERENTIAL/PLATELET - Abnormal; Notable for the following:    RBC 3.40 (*)    Hemoglobin 11.9 (*)    HCT 36.9 (*)    MCV 108.5 (*)    [name] 35.0 (*)    RDW 20.0 (*)    Platelets 128 (*)    Neutro Abs 8.5 (*)    Lymphs Abs 0.5 (*)    All other components within normal limits  URINALYSIS, ROUTINE W REFLEX MICROSCOPIC - Abnormal; Notable for the following:    APPearance HAZY (*)    All other components within normal limits    EKG  EKG Interpretation None  Radiology No results found.  Procedures Procedures (including critical care time)  Medications Ordered in ED Medications - No data to display   Initial Impression / Assessment and Plan / ED Course  I have reviewed the triage vital signs and the nursing notes.  Pertinent labs & imaging results that were available during my care of the patient were reviewed by me and considered in my medical decision making (see chart for  details).    Patient with the large hiatal hernia. Given the position that he was in this morning and onset of his pain. I suspect that he may have strained the diaphragm . Also, as this patient's pain is in the distribution of the diaphragm. No acute abnormalities are found on his x-ray or CT scan. There is some ectasia of the aorta in the abdomen. Patient is followed with his PCP for potential referral to vascular physicians. No other abnormalities are found on his workup. No concern for ACS or PE. Patient appears safe for discharge at this time. Patient seen in shared visit with attending physician. Who agrees with assessment, work up , treatment, and plan for discharge     Final Clinical Impressions(s) / ED Diagnoses   Final diagnoses:  Chest pain    New Prescriptions New Prescriptions   No medications on file     Margarita Mail, PA-C 12/07/16 [place]    Malvin Johns, MD 12/12/16 1125

## 2016-12-02 NOTE — ED Triage Notes (Signed)
Per EMS: Coming from home  This morning woke up with mid-thoracic back pain and went all the way to the abdomen. Has had a productive for 2 weeks. His pain increases with coughing.  Pain did not increase with palpation.   122/78 86 16  Pt states he feels the pain up under his shoulder blades. Pt has had kidney stones in the past but this does not feel the same. Pt has not had any difficulty urinating. Pt denies N/V/D

## 2016-12-02 NOTE — ED Notes (Signed)
Patient transported to X-ray 

## 2016-12-02 NOTE — Discharge Instructions (Signed)
Follow up with your primary care doctor about your aortic ectasia Get help right away if: Your pain is getting worse. Your pain spreads to your arms, neck, jaw, teeth, or back. You have shortness of breath. You sweat for no reason. You feel sick to your stomach (nauseous) or you vomit. You vomit blood. You have bright red blood in your stools. You have black, tarry stools.

## 2016-12-02 NOTE — ED Notes (Signed)
Bed: WA07 Expected date: 12/02/16 Expected time: 4:06 PM Means of arrival: Ambulance Comments: Cough, Back pain

## 2016-12-05 ENCOUNTER — Telehealth: Payer: Self-pay | Admitting: Gastroenterology

## 2016-12-05 NOTE — Telephone Encounter (Signed)
Ladene Artist, MD sent to Marlon Pel, RN        Jebidiah Baggerly,   Pt should contact his PCP for symptoms, problems below-they are not GI related. His HH is not causing his pain. No acute GI appt is needed.   MS   Previous Messages    ----- Message -----  From: Carol Ada, MD  Sent: 12/04/2016 11:35 AM  To: Ladene Artist, MD   Norberto Sorenson,   I just spoke with Mr. Robart and his daughter. They called as he was in the ER with complaints of R>L rib pain and right sided chest pain. The ER note documented that he had pain in his ribs. He was lifting something heavy the other day and then acutely, when he was pulling up his pants his pain markedly worsened. A CT scan was performed and there was no evidence of an aneurysm, but it did note a large hiatal hernia. This was present during my EGD for him 07/2016 when he was in the hospital for bleeding issues. Per his description, movement does exacerbate his symptoms, such as breathing in and any twisting movement. He has some mild nausea, but no vomiting. I think his symptoms sound to be more musculoskeletal. He is already on prednisone 7.5 mg and I asked him to double it for the next couple of days. He is also to use his ER prescribed Vicodin and they plan to call you on Monday to schedule an appointment. He has a creatinine of 1.6 and I opted not to use NSAIDs.   Saralyn Pilar      Wife notified.  She would like to keep the appt with Dr. Fuller Plan until they see Dr. Sarajane Jews. She will cancel if not needed.

## 2016-12-06 ENCOUNTER — Ambulatory Visit (INDEPENDENT_AMBULATORY_CARE_PROVIDER_SITE_OTHER): Payer: Medicare Other | Admitting: Family Medicine

## 2016-12-06 ENCOUNTER — Encounter (HOSPITAL_COMMUNITY): Payer: Medicare Other

## 2016-12-06 ENCOUNTER — Encounter: Payer: Self-pay | Admitting: Family Medicine

## 2016-12-06 VITALS — BP 120/78 | HR 84 | Temp 98.3°F

## 2016-12-06 DIAGNOSIS — M546 Pain in thoracic spine: Secondary | ICD-10-CM | POA: Diagnosis not present

## 2016-12-06 MED ORDER — HYDROCODONE-ACETAMINOPHEN 10-325 MG PO TABS
1.0000 | ORAL_TABLET | Freq: Four times a day (QID) | ORAL | 0 refills | Status: DC | PRN
Start: 2016-12-06 — End: 2016-12-13

## 2016-12-06 MED ORDER — KETOROLAC TROMETHAMINE 60 MG/2ML IM SOLN
60.0000 mg | Freq: Once | INTRAMUSCULAR | Status: AC
  Administered 2016-12-06: 60 mg via INTRAMUSCULAR

## 2016-12-06 NOTE — Addendum Note (Signed)
Addended by: Aggie Hacker A on: 12/06/2016 03:23 PM   Modules accepted: Orders

## 2016-12-06 NOTE — Progress Notes (Signed)
   Subjective:    Patient ID: Richard [name], male    DOB: [birth year], 81 y.o.   MRN: [id number]  HPI Here with his wife to follow up an ER visit on 12-02-16 for sharp pains that started in the middle of his back and then radiated around both sides towards the middle abdomen. No fever. No change in urinations. No change in BMs. No nausea or vomiting. No cough or SOB. He remembers a a time 6 days ago when he was getting ready to mow some grass at his church. That day he lifted a full 5 gallon container of gasoline over the side of his truck bed and he felt a tinge of pain in the middle back. This did no last very long and he went on to ride a riding mower for the next 3 hours without difficulty. Then the next morning he simply bent over to pull on his pants and felt a severe sharp pain in the middle back that also radiates around to the front. In the ER he had a CXR which showed his chronic interstital lung disease but no acute changes. He had a CT of the abdomen and pelvis which showed a large hiatal hernia (chronic), a small right pleural effusion (chronic), and a moderate stool burden. His labs looked god with a stable creatinine at 1.63, a clear UA, and a steadily rising Hgb at 11.9. He was told the pain was from the hiatal hernia and he was given some pain medication. Since then he has continued to have pain in the back and to a lesser extent in the abdomen. The pain medication helps a little. He spoke to Dr. Benson Norway, who also spoke to Dr. Fuller Plan, and they both felt that his hiatal hernia was not the cause of his pain.    Review of Systems  Constitutional: Negative.   Respiratory: Negative.   Cardiovascular: Negative.   Gastrointestinal: Positive for abdominal pain. Negative for abdominal distention, anal bleeding, blood in stool, constipation, diarrhea, nausea, rectal pain and vomiting.  Genitourinary: Negative.   Musculoskeletal: Positive for back pain.  Neurological: Negative.          Objective:   Physical Exam  Constitutional: He is oriented to person, place, and time.  In pain, in a wheelchair   Neck: No thyromegaly present.  Cardiovascular: Normal rate, regular rhythm, normal heart sounds and intact distal pulses.   Pulmonary/Chest: Effort normal and breath sounds normal. No respiratory distress. He has no wheezes. He has no rales.  Abdominal: Soft. Bowel sounds are normal. He exhibits no distension and no mass. There is no tenderness. There is no rebound and no guarding.  Musculoskeletal:  He is quite tender in the middle thoracic area over the spine and also just to the left of the spine. He has full flexion but full extension of the spine causes pain. He sits in the wheelchair hunched forward.   Lymphadenopathy:    He has no cervical adenopathy.  Neurological: He is alert and oriented to person, place, and time.          Assessment & Plan:  I think the source of the pain is his spine, and he most likely has a compression fracture of a thoracic vertebra. We will give him a Toradol shot today and I will write for some Vicodin to use for pain. We will set up an MRI of the thoracic spine as well.  Alysia Penna, MD

## 2016-12-06 NOTE — Progress Notes (Signed)
Pre visit review using our clinic review tool, if applicable. No additional management support is needed unless otherwise documented below in the visit note. Pt unable to stand and weigh.   

## 2016-12-06 NOTE — Patient Instructions (Signed)
WE NOW OFFER   [name] FAST TRACK!!!  SAME DAY Appointments for ACUTE CARE  Such as: Sprains, Injuries, cuts, abrasions, rashes, muscle pain, joint pain, back pain Colds, flu, sore throats, headache, allergies, cough, fever  Ear pain, sinus and eye infections Abdominal pain, nausea, vomiting, diarrhea, upset stomach Animal/insect bites  3 Easy Ways to Schedule: Walk-In Scheduling Call in scheduling Mychart Sign-up: [email or web address]         

## 2016-12-07 ENCOUNTER — Ambulatory Visit: Payer: Medicare Other | Admitting: Gastroenterology

## 2016-12-08 ENCOUNTER — Telehealth: Payer: Self-pay | Admitting: Family Medicine

## 2016-12-08 NOTE — Telephone Encounter (Signed)
Spoke to pt and per Dr. Sarajane Jews it is okay to receive second Toradol shot. Pt has been scheduled tomorrow to come in on the nurse schedule

## 2016-12-08 NOTE — Telephone Encounter (Signed)
° ° °  Pt wife call and ask if pt can have another shot like he he had on Monday.

## 2016-12-08 NOTE — Telephone Encounter (Signed)
Please advise 

## 2016-12-08 NOTE — Telephone Encounter (Signed)
Yes he can schedule a nurse visit, please give him another shot of Toradol 60 mg

## 2016-12-09 ENCOUNTER — Ambulatory Visit (INDEPENDENT_AMBULATORY_CARE_PROVIDER_SITE_OTHER): Payer: Medicare Other

## 2016-12-09 DIAGNOSIS — M545 Low back pain, unspecified: Secondary | ICD-10-CM

## 2016-12-09 MED ORDER — KETOROLAC TROMETHAMINE 60 MG/2ML IM SOLN
60.0000 mg | Freq: Once | INTRAMUSCULAR | Status: AC
  Administered 2016-12-09: 60 mg via INTRAMUSCULAR

## 2016-12-13 ENCOUNTER — Telehealth: Payer: Self-pay | Admitting: Family Medicine

## 2016-12-13 ENCOUNTER — Ambulatory Visit (INDEPENDENT_AMBULATORY_CARE_PROVIDER_SITE_OTHER): Payer: Medicare Other | Admitting: Family Medicine

## 2016-12-13 ENCOUNTER — Encounter: Payer: Self-pay | Admitting: Family Medicine

## 2016-12-13 VITALS — BP 112/70 | HR 85 | Temp 98.4°F

## 2016-12-13 DIAGNOSIS — M4850XS Collapsed vertebra, not elsewhere classified, site unspecified, sequela of fracture: Secondary | ICD-10-CM | POA: Diagnosis not present

## 2016-12-13 DIAGNOSIS — M546 Pain in thoracic spine: Secondary | ICD-10-CM | POA: Diagnosis not present

## 2016-12-13 DIAGNOSIS — IMO0001 Reserved for inherently not codable concepts without codable children: Secondary | ICD-10-CM

## 2016-12-13 MED ORDER — OXYCODONE-ACETAMINOPHEN 10-325 MG PO TABS: 1.0000 | ORAL_TABLET | Freq: Four times a day (QID) | ORAL | 0 refills | Status: DC | PRN

## 2016-12-13 MED ORDER — KETOROLAC TROMETHAMINE 60 MG/2ML IM SOLN
60.0000 mg | Freq: Once | INTRAMUSCULAR | Status: AC
  Administered 2016-12-13: 60 mg via INTRAMUSCULAR

## 2016-12-13 NOTE — Telephone Encounter (Signed)
Per Dr Sarajane Jews okay to give diagnosis which is possible compression fracture of vertebrae and I did speak with pharmacy about this.

## 2016-12-13 NOTE — Patient Instructions (Signed)
WE NOW OFFER   [facility] FAST TRACK!!!  SAME DAY Appointments for ACUTE CARE  Such as: Sprains, Injuries, cuts, abrasions, rashes, muscle pain, joint pain, back pain Colds, flu, sore throats, headache, allergies, cough, fever  Ear pain, sinus and eye infections Abdominal pain, nausea, vomiting, diarrhea, upset stomach Animal/insect bites  3 Easy Ways to Schedule: Walk-In Scheduling Call in scheduling Mychart Sign-up: [email or web address]         

## 2016-12-13 NOTE — Progress Notes (Signed)
   Subjective:    Patient ID: Richard [name], male    DOB: 27-Dec-1935, 81 y.o.   MRN: [id number]  HPI Here to follow up on severe thoracic spinal pain which is probably from a vertebral compression fracture. We saw him last week and gave him several Toradol shots. These are effective but do not last very long. He has been taking Hydrocodone every 6 hours but this has not helped his pain very much. No new symptoms. He is scheduled for an MRI of the thoracic spine for 12-21-16.    Review of Systems  Constitutional: Negative.   Respiratory: Negative.   Cardiovascular: Negative.   Gastrointestinal: Negative.   Musculoskeletal: Positive for back pain.       Objective:   Physical Exam  Constitutional: He is oriented to person, place, and time.  In a wheelchair, in obvious pain. He prefers to sit hunched forward slightly  Cardiovascular: Normal rate, regular rhythm, normal heart sounds and intact distal pulses.   Pulmonary/Chest: Effort normal and breath sounds normal.  Musculoskeletal:  Tender over the central thoracic spine. Flexion is full but extension is limited by pain  Neurological: He is alert and oriented to person, place, and time.          Assessment & Plan:  Thoracic back pain, presumed vertebral fracture versus herniated disc. He will get the MRI as scheduled. For pain relief we will switch to Percocet 10/325 as needed. He is given another shot of Toradol today.  Alysia Penna, MD

## 2016-12-13 NOTE — Telephone Encounter (Signed)
Pharmacy would like to know what pt's dx is for him to continue on oxyCODONE-acetaminophen (PERCOCET) 10-325 MG tablet  Does pt have a chronic pain that he need to be on for this med?  Pt is waiting at the pharmacy now.     [place], [place]

## 2016-12-21 ENCOUNTER — Other Ambulatory Visit: Payer: Self-pay | Admitting: Family Medicine

## 2016-12-21 ENCOUNTER — Telehealth: Payer: Self-pay | Admitting: Family Medicine

## 2016-12-21 ENCOUNTER — Ambulatory Visit
Admission: RE | Admit: 2016-12-21 | Discharge: 2016-12-21 | Disposition: A | Discharge status: Home / Self Care | Payer: Medicare Other | Source: Ambulatory Visit | Attending: Family Medicine | Admitting: Family Medicine

## 2016-12-21 DIAGNOSIS — M546 Pain in thoracic spine: Secondary | ICD-10-CM

## 2016-12-21 NOTE — Telephone Encounter (Signed)
Please call the MRI facility to see if they got enough data for [name] to look at. If not, let's try another scan but have him come by here first to get a Toradol shot to get him through the procedure

## 2016-12-21 NOTE — Telephone Encounter (Signed)
I spoke with pt and went over below information. 

## 2016-12-21 NOTE — Addendum Note (Signed)
Addended by: Alysia Penna A on: 12/21/2016 05:09 PM   Modules accepted: Orders

## 2016-12-21 NOTE — Telephone Encounter (Signed)
° °  Pt wife call to say that pt could not complete the MRI today  Because he was in so much pain. He could not lay down he was in so much pain .Pt wife is asking what may be the next step. They would like a call back

## 2016-12-21 NOTE — Telephone Encounter (Signed)
I put in a new order for an MRI scan. Tell him to take an Oxycodone pill and then come by here for a Toradol shot just before the scan

## 2016-12-21 NOTE — Telephone Encounter (Signed)
I spoke with pt and he would like to try the MRI again and stop by the office for injection first.

## 2016-12-22 MED ORDER — OXYCODONE-ACETAMINOPHEN 10-325 MG PO TABS: 1.0000 | ORAL_TABLET | Freq: Four times a day (QID) | ORAL | 0 refills | Status: AC | PRN

## 2016-12-22 NOTE — Telephone Encounter (Signed)
Pt is scheduled for the repeat MRI on Friday, May 25 at 10:20.  This was first available. Wife states pt does not have enough   oxyCODONE-acetaminophen (PERCOCET) 10-325 MG tablet To get him through until that time.   Pt has 2 pills left Would like a refill.  Pt aware to come by the office prior to this appt for injection.  Please call wife when Rx ready for pick up.

## 2016-12-22 NOTE — Telephone Encounter (Signed)
done

## 2016-12-22 NOTE — Telephone Encounter (Signed)
Script is ready for pick up here at front office and I spoke with pt.  

## 2016-12-30 ENCOUNTER — Ambulatory Visit: Payer: Medicare Other

## 2016-12-30 ENCOUNTER — Ambulatory Visit (INDEPENDENT_AMBULATORY_CARE_PROVIDER_SITE_OTHER): Payer: Medicare Other

## 2016-12-30 ENCOUNTER — Ambulatory Visit
Admission: RE | Admit: 2016-12-30 | Discharge: 2016-12-30 | Disposition: A | Discharge status: Home / Self Care | Payer: Medicare Other | Source: Ambulatory Visit | Attending: Family Medicine | Admitting: Family Medicine

## 2016-12-30 ENCOUNTER — Other Ambulatory Visit: Payer: Self-pay

## 2016-12-30 DIAGNOSIS — M546 Pain in thoracic spine: Secondary | ICD-10-CM

## 2016-12-30 MED ORDER — KETOROLAC TROMETHAMINE 60 MG/2ML IM SOLN: 60.0000 mg | Freq: Once | INTRAMUSCULAR | Status: DC

## 2016-12-30 MED ORDER — KETOROLAC TROMETHAMINE 60 MG/2ML IM SOLN
60.0000 mg | Freq: Once | INTRAMUSCULAR | Status: AC
  Administered 2016-12-30: 60 mg via INTRAMUSCULAR

## 2016-12-30 NOTE — Addendum Note (Signed)
Addended by: Alysia Penna A on: 12/30/2016 04:51 PM   Modules accepted: Orders

## 2016-12-30 NOTE — Progress Notes (Signed)
Patient received his Toradol 60 mg today 12/30/16 at 10.10 am. Given for Acute midline thoracic back pain. Injection was given by Rosealee Albee CMA.

## 2017-01-03 ENCOUNTER — Other Ambulatory Visit: Payer: Self-pay | Admitting: Family Medicine

## 2017-01-03 DIAGNOSIS — IMO0001 Reserved for inherently not codable concepts without codable children: Secondary | ICD-10-CM

## 2017-01-03 DIAGNOSIS — M4850XS Collapsed vertebra, not elsewhere classified, site unspecified, sequela of fracture: Principal | ICD-10-CM

## 2017-01-04 ENCOUNTER — Other Ambulatory Visit (HOSPITAL_COMMUNITY): Payer: Self-pay | Admitting: *Deleted

## 2017-01-05 ENCOUNTER — Encounter (HOSPITAL_COMMUNITY): Payer: Self-pay | Admitting: Emergency Medicine

## 2017-01-05 ENCOUNTER — Other Ambulatory Visit: Payer: Self-pay

## 2017-01-05 ENCOUNTER — Encounter (HOSPITAL_COMMUNITY)
Admission: RE | Admit: 2017-01-05 | Discharge: 2017-01-05 | Disposition: A | Discharge status: Home / Self Care | Payer: Medicare Other | Source: Ambulatory Visit | Attending: Nephrology | Admitting: Nephrology

## 2017-01-05 ENCOUNTER — Inpatient Hospital Stay (HOSPITAL_COMMUNITY)
Admission: EM | Admit: 2017-01-05 | Discharge: 2017-01-09 | DRG: 871 | Disposition: A | Discharge status: Home Health | Payer: Medicare Other | Attending: Internal Medicine | Admitting: Internal Medicine

## 2017-01-05 ENCOUNTER — Emergency Department (HOSPITAL_COMMUNITY): Payer: Medicare Other

## 2017-01-05 DIAGNOSIS — R0609 Other forms of dyspnea: Secondary | ICD-10-CM

## 2017-01-05 DIAGNOSIS — R0600 Dyspnea, unspecified: Secondary | ICD-10-CM

## 2017-01-05 DIAGNOSIS — R000 Tachycardia, unspecified: Secondary | ICD-10-CM | POA: Diagnosis present

## 2017-01-05 DIAGNOSIS — D638 Anemia in other chronic diseases classified elsewhere: Secondary | ICD-10-CM

## 2017-01-05 DIAGNOSIS — E860 Dehydration: Secondary | ICD-10-CM | POA: Diagnosis present

## 2017-01-05 DIAGNOSIS — J440 Chronic obstructive pulmonary disease with acute lower respiratory infection: Secondary | ICD-10-CM | POA: Diagnosis present

## 2017-01-05 DIAGNOSIS — M4854XS Collapsed vertebra, not elsewhere classified, thoracic region, sequela of fracture: Secondary | ICD-10-CM

## 2017-01-05 DIAGNOSIS — N400 Enlarged prostate without lower urinary tract symptoms: Secondary | ICD-10-CM | POA: Diagnosis present

## 2017-01-05 DIAGNOSIS — K219 Gastro-esophageal reflux disease without esophagitis: Secondary | ICD-10-CM | POA: Diagnosis present

## 2017-01-05 DIAGNOSIS — R531 Weakness: Secondary | ICD-10-CM | POA: Diagnosis present

## 2017-01-05 DIAGNOSIS — Z9842 Cataract extraction status, left eye: Secondary | ICD-10-CM

## 2017-01-05 DIAGNOSIS — J189 Pneumonia, unspecified organism: Secondary | ICD-10-CM | POA: Diagnosis not present

## 2017-01-05 DIAGNOSIS — Z825 Family history of asthma and other chronic lower respiratory diseases: Secondary | ICD-10-CM

## 2017-01-05 DIAGNOSIS — Y95 Nosocomial condition: Secondary | ICD-10-CM | POA: Diagnosis present

## 2017-01-05 DIAGNOSIS — J181 Lobar pneumonia, unspecified organism: Secondary | ICD-10-CM | POA: Diagnosis present

## 2017-01-05 DIAGNOSIS — Z87442 Personal history of urinary calculi: Secondary | ICD-10-CM

## 2017-01-05 DIAGNOSIS — J9610 Chronic respiratory failure, unspecified whether with hypoxia or hypercapnia: Secondary | ICD-10-CM | POA: Diagnosis present

## 2017-01-05 DIAGNOSIS — I129 Hypertensive chronic kidney disease with stage 1 through stage 4 chronic kidney disease, or unspecified chronic kidney disease: Secondary | ICD-10-CM | POA: Diagnosis present

## 2017-01-05 DIAGNOSIS — Z5181 Encounter for therapeutic drug level monitoring: Secondary | ICD-10-CM | POA: Diagnosis not present

## 2017-01-05 DIAGNOSIS — K922 Gastrointestinal hemorrhage, unspecified: Secondary | ICD-10-CM | POA: Diagnosis present

## 2017-01-05 DIAGNOSIS — Z7982 Long term (current) use of aspirin: Secondary | ICD-10-CM

## 2017-01-05 DIAGNOSIS — I350 Nonrheumatic aortic (valve) stenosis: Secondary | ICD-10-CM | POA: Diagnosis present

## 2017-01-05 DIAGNOSIS — H9190 Unspecified hearing loss, unspecified ear: Secondary | ICD-10-CM | POA: Diagnosis present

## 2017-01-05 DIAGNOSIS — N179 Acute kidney failure, unspecified: Secondary | ICD-10-CM | POA: Diagnosis present

## 2017-01-05 DIAGNOSIS — Z9841 Cataract extraction status, right eye: Secondary | ICD-10-CM | POA: Diagnosis not present

## 2017-01-05 DIAGNOSIS — Z79899 Other long term (current) drug therapy: Secondary | ICD-10-CM | POA: Insufficient documentation

## 2017-01-05 DIAGNOSIS — I959 Hypotension, unspecified: Secondary | ICD-10-CM | POA: Diagnosis present

## 2017-01-05 DIAGNOSIS — E785 Hyperlipidemia, unspecified: Secondary | ICD-10-CM | POA: Diagnosis present

## 2017-01-05 DIAGNOSIS — D631 Anemia in chronic kidney disease: Secondary | ICD-10-CM | POA: Insufficient documentation

## 2017-01-05 DIAGNOSIS — Z8249 Family history of ischemic heart disease and other diseases of the circulatory system: Secondary | ICD-10-CM | POA: Diagnosis not present

## 2017-01-05 DIAGNOSIS — A419 Sepsis, unspecified organism: Secondary | ICD-10-CM | POA: Diagnosis present

## 2017-01-05 DIAGNOSIS — M3131 Wegener's granulomatosis with renal involvement: Secondary | ICD-10-CM | POA: Diagnosis not present

## 2017-01-05 DIAGNOSIS — I2510 Atherosclerotic heart disease of native coronary artery without angina pectoris: Secondary | ICD-10-CM | POA: Diagnosis present

## 2017-01-05 DIAGNOSIS — N184 Chronic kidney disease, stage 4 (severe): Secondary | ICD-10-CM | POA: Diagnosis present

## 2017-01-05 DIAGNOSIS — Z955 Presence of coronary angioplasty implant and graft: Secondary | ICD-10-CM

## 2017-01-05 DIAGNOSIS — J479 Bronchiectasis, uncomplicated: Secondary | ICD-10-CM | POA: Diagnosis not present

## 2017-01-05 DIAGNOSIS — M3130 Wegener's granulomatosis without renal involvement: Secondary | ICD-10-CM | POA: Diagnosis present

## 2017-01-05 DIAGNOSIS — N183 Chronic kidney disease, stage 3 (moderate): Secondary | ICD-10-CM | POA: Insufficient documentation

## 2017-01-05 DIAGNOSIS — Z7951 Long term (current) use of inhaled steroids: Secondary | ICD-10-CM

## 2017-01-05 DIAGNOSIS — M4854XA Collapsed vertebra, not elsewhere classified, thoracic region, initial encounter for fracture: Secondary | ICD-10-CM | POA: Diagnosis present

## 2017-01-05 DIAGNOSIS — Z961 Presence of intraocular lens: Secondary | ICD-10-CM | POA: Diagnosis present

## 2017-01-05 DIAGNOSIS — Z800 Family history of malignant neoplasm of digestive organs: Secondary | ICD-10-CM | POA: Diagnosis not present

## 2017-01-05 DIAGNOSIS — M4854XD Collapsed vertebra, not elsewhere classified, thoracic region, subsequent encounter for fracture with routine healing: Secondary | ICD-10-CM | POA: Diagnosis not present

## 2017-01-05 DIAGNOSIS — D649 Anemia, unspecified: Secondary | ICD-10-CM | POA: Diagnosis present

## 2017-01-05 DIAGNOSIS — Z7952 Long term (current) use of systemic steroids: Secondary | ICD-10-CM

## 2017-01-05 DIAGNOSIS — Z8711 Personal history of peptic ulcer disease: Secondary | ICD-10-CM

## 2017-01-05 LAB — CBC WITH DIFFERENTIAL/PLATELET
BASOS ABS: 0 10*3/uL (ref 0.0–0.1)
Basophils Relative: 0 %
EOS PCT: 1 %
Eosinophils Absolute: 0.1 10*3/uL (ref 0.0–0.7)
HCT: 27.7 % — ABNORMAL LOW (ref 39.0–52.0)
Hemoglobin: 8.9 g/dL — ABNORMAL LOW (ref 13.0–17.0)
LYMPHS PCT: 5 %
Lymphs Abs: 0.5 10*3/uL — ABNORMAL LOW (ref 0.7–4.0)
MCH: 33.5 pg (ref 26.0–34.0)
MCHC: 32.1 g/dL (ref 30.0–36.0)
MCV: 104.1 fL — AB (ref 78.0–100.0)
MONO ABS: 1.4 10*3/uL — AB (ref 0.1–1.0)
MONOS PCT: 13 %
Neutro Abs: 9 10*3/uL — ABNORMAL HIGH (ref 1.7–7.7)
Neutrophils Relative %: 81 %
PLATELETS: 271 10*3/uL (ref 150–400)
RBC: 2.66 MIL/uL — ABNORMAL LOW (ref 4.22–5.81)
RDW: 16.7 % — AB (ref 11.5–15.5)
WBC: 11.1 10*3/uL — ABNORMAL HIGH (ref 4.0–10.5)

## 2017-01-05 LAB — IRON AND TIBC
Iron: 17 ug/dL — ABNORMAL LOW (ref 45–182)
Saturation Ratios: 12 % — ABNORMAL LOW (ref 17.9–39.5)
TIBC: 140 ug/dL — ABNORMAL LOW (ref 250–450)
UIBC: 123 ug/dL

## 2017-01-05 LAB — I-STAT CG4 LACTIC ACID, ED: LACTIC ACID, VENOUS: 2.52 mmol/L — AB (ref 0.5–1.9)

## 2017-01-05 LAB — PROTIME-INR
INR: 1.09
Prothrombin Time: 14.2 seconds (ref 11.4–15.2)

## 2017-01-05 LAB — COMPREHENSIVE METABOLIC PANEL
ALT: 16 U/L — AB (ref 17–63)
AST: 23 U/L (ref 15–41)
Albumin: 3.1 g/dL — ABNORMAL LOW (ref 3.5–5.0)
Alkaline Phosphatase: 98 U/L (ref 38–126)
Anion gap: 10 (ref 5–15)
BILIRUBIN TOTAL: 0.7 mg/dL (ref 0.3–1.2)
BUN: 20 mg/dL (ref 6–20)
CHLORIDE: 105 mmol/L (ref 101–111)
CO2: 26 mmol/L (ref 22–32)
CREATININE: 2.34 mg/dL — AB (ref 0.61–1.24)
Calcium: 9.2 mg/dL (ref 8.9–10.3)
GFR calc Af Amer: 29 mL/min — ABNORMAL LOW (ref >60)
GFR, EST NON AFRICAN AMERICAN: 25 mL/min — AB (ref >60)
GLUCOSE: 101 mg/dL — AB (ref 65–99)
Potassium: 4.1 mmol/L (ref 3.5–5.1)
Sodium: 141 mmol/L (ref 135–145)
Total Protein: 6.1 g/dL — ABNORMAL LOW (ref 6.5–8.1)

## 2017-01-05 LAB — URINALYSIS, ROUTINE W REFLEX MICROSCOPIC
BILIRUBIN URINE: NEGATIVE
GLUCOSE, UA: NEGATIVE mg/dL
HGB URINE DIPSTICK: NEGATIVE
Ketones, ur: NEGATIVE mg/dL
Leukocytes, UA: NEGATIVE
Nitrite: NEGATIVE
Protein, ur: NEGATIVE mg/dL
SPECIFIC GRAVITY, URINE: 1.009 (ref 1.005–1.030)
pH: 8 (ref 5.0–8.0)

## 2017-01-05 LAB — PROCALCITONIN: Procalcitonin: <0.1 ng/mL

## 2017-01-05 LAB — LACTIC ACID, PLASMA
LACTIC ACID, VENOUS: 1 mmol/L (ref 0.5–1.9)
LACTIC ACID, VENOUS: 1.3 mmol/L (ref 0.5–1.9)

## 2017-01-05 LAB — TROPONIN I

## 2017-01-05 LAB — APTT: aPTT: 37 seconds — ABNORMAL HIGH (ref 24–36)

## 2017-01-05 LAB — MRSA PCR SCREENING: MRSA by PCR: NEGATIVE

## 2017-01-05 LAB — FERRITIN: FERRITIN: 691 ng/mL — AB (ref 24–336)

## 2017-01-05 MED ORDER — VANCOMYCIN HCL IN DEXTROSE 1-5 GM/200ML-% IV SOLN: 1000.0000 mg | Freq: Once | INTRAVENOUS | Status: DC

## 2017-01-05 MED ORDER — IPRATROPIUM-ALBUTEROL 0.5-2.5 (3) MG/3ML IN SOLN: 3.0000 mL | RESPIRATORY_TRACT | Status: DC | PRN

## 2017-01-05 MED ORDER — TRAMADOL HCL 50 MG PO TABS
50.0000 mg | ORAL_TABLET | Freq: Four times a day (QID) | ORAL | Status: DC | PRN
  Administered 2017-01-05: 50 mg via ORAL
  Filled 2017-01-05: qty 1

## 2017-01-05 MED ORDER — SIMVASTATIN 20 MG PO TABS
20.0000 mg | ORAL_TABLET | Freq: Every day | ORAL | Status: DC
  Administered 2017-01-05 – 2017-01-08 (×4): 20 mg via ORAL
  Filled 2017-01-05 (×5): qty 1

## 2017-01-05 MED ORDER — ONDANSETRON HCL 4 MG PO TABS: 4.0000 mg | ORAL_TABLET | Freq: Four times a day (QID) | ORAL | Status: DC | PRN

## 2017-01-05 MED ORDER — CYCLOPHOSPHAMIDE 25 MG PO CAPS
25.0000 mg | ORAL_CAPSULE | Freq: Every day | ORAL | Status: DC
  Administered 2017-01-06 – 2017-01-09 (×4): 25 mg via ORAL
  Filled 2017-01-05 (×4): qty 1

## 2017-01-05 MED ORDER — ONDANSETRON HCL 4 MG/2ML IJ SOLN: 4.0000 mg | Freq: Four times a day (QID) | INTRAMUSCULAR | Status: DC | PRN

## 2017-01-05 MED ORDER — METHOCARBAMOL 1000 MG/10ML IJ SOLN: 500.0000 mg | Freq: Three times a day (TID) | INTRAVENOUS | Status: DC | PRN

## 2017-01-05 MED ORDER — FAMOTIDINE 20 MG PO TABS: 20.0000 mg | ORAL_TABLET | Freq: Two times a day (BID) | ORAL | Status: DC

## 2017-01-05 MED ORDER — FENTANYL CITRATE (PF) 100 MCG/2ML IJ SOLN: 50.0000 ug | INTRAMUSCULAR | Status: DC | PRN

## 2017-01-05 MED ORDER — CEFEPIME HCL 2 G IJ SOLR
2.0000 g | Freq: Once | INTRAMUSCULAR | Status: AC
  Administered 2017-01-05: 2 g via INTRAVENOUS
  Filled 2017-01-05: qty 2

## 2017-01-05 MED ORDER — SALINE SPRAY 0.65 % NA SOLN
1.0000 | NASAL | Status: DC | PRN
  Filled 2017-01-05: qty 44

## 2017-01-05 MED ORDER — FINASTERIDE 5 MG PO TABS
5.0000 mg | ORAL_TABLET | Freq: Every day | ORAL | Status: DC
  Administered 2017-01-06 – 2017-01-09 (×4): 5 mg via ORAL
  Filled 2017-01-05 (×4): qty 1

## 2017-01-05 MED ORDER — SODIUM CHLORIDE 0.9 % IV BOLUS (SEPSIS)
1000.0000 mL | Freq: Once | INTRAVENOUS | Status: AC
  Administered 2017-01-05: 1000 mL via INTRAVENOUS

## 2017-01-05 MED ORDER — FAMOTIDINE 20 MG PO TABS
20.0000 mg | ORAL_TABLET | ORAL | Status: DC
  Administered 2017-01-05 – 2017-01-08 (×4): 20 mg via ORAL
  Filled 2017-01-05 (×4): qty 1

## 2017-01-05 MED ORDER — SODIUM BICARBONATE 650 MG PO TABS
1300.0000 mg | ORAL_TABLET | Freq: Two times a day (BID) | ORAL | Status: DC
  Administered 2017-01-05 – 2017-01-09 (×8): 1300 mg via ORAL
  Filled 2017-01-05 (×8): qty 2

## 2017-01-05 MED ORDER — DEXTROSE 5 % IV SOLN
1.0000 g | INTRAVENOUS | Status: DC
  Administered 2017-01-06 – 2017-01-07 (×2): 1 g via INTRAVENOUS
  Filled 2017-01-05 (×3): qty 1

## 2017-01-05 MED ORDER — ACETAMINOPHEN 500 MG PO TABS
1000.0000 mg | ORAL_TABLET | Freq: Three times a day (TID) | ORAL | Status: DC
  Administered 2017-01-05 – 2017-01-09 (×9): 1000 mg via ORAL
  Filled 2017-01-05 (×9): qty 2

## 2017-01-05 MED ORDER — PANTOPRAZOLE SODIUM 40 MG PO TBEC
40.0000 mg | DELAYED_RELEASE_TABLET | Freq: Every day | ORAL | Status: DC
  Administered 2017-01-06 – 2017-01-09 (×4): 40 mg via ORAL
  Filled 2017-01-05 (×4): qty 1

## 2017-01-05 MED ORDER — PREDNISONE 5 MG PO TABS
7.5000 mg | ORAL_TABLET | Freq: Every day | ORAL | Status: DC
  Administered 2017-01-06 – 2017-01-09 (×4): 7.5 mg via ORAL
  Filled 2017-01-05 (×4): qty 2

## 2017-01-05 MED ORDER — ASPIRIN EC 81 MG PO TBEC
81.0000 mg | DELAYED_RELEASE_TABLET | Freq: Every day | ORAL | Status: DC
  Administered 2017-01-06 – 2017-01-09 (×4): 81 mg via ORAL
  Filled 2017-01-05 (×4): qty 1

## 2017-01-05 MED ORDER — BENZONATATE 100 MG PO CAPS: 200.0000 mg | ORAL_CAPSULE | Freq: Three times a day (TID) | ORAL | Status: DC | PRN

## 2017-01-05 MED ORDER — VANCOMYCIN HCL IN DEXTROSE 750-5 MG/150ML-% IV SOLN
750.0000 mg | INTRAVENOUS | Status: DC
  Filled 2017-01-05: qty 150

## 2017-01-05 MED ORDER — MOMETASONE FURO-FORMOTEROL FUM 200-5 MCG/ACT IN AERO
2.0000 | INHALATION_SPRAY | Freq: Two times a day (BID) | RESPIRATORY_TRACT | Status: DC
  Administered 2017-01-05 – 2017-01-09 (×6): 2 via RESPIRATORY_TRACT
  Filled 2017-01-05 (×2): qty 8.8

## 2017-01-05 MED ORDER — TAMSULOSIN HCL 0.4 MG PO CAPS
0.4000 mg | ORAL_CAPSULE | Freq: Every day | ORAL | Status: DC
  Administered 2017-01-06 – 2017-01-09 (×4): 0.4 mg via ORAL
  Filled 2017-01-05 (×4): qty 1

## 2017-01-05 MED ORDER — EPOETIN ALFA 40000 UNIT/ML IJ SOLN: 30000.0000 [IU] | INTRAMUSCULAR | Status: DC

## 2017-01-05 MED ORDER — ACETAMINOPHEN 650 MG RE SUPP: 650.0000 mg | Freq: Three times a day (TID) | RECTAL | Status: DC

## 2017-01-05 MED ORDER — SODIUM CHLORIDE 0.9 % IV SOLN
INTRAVENOUS | Status: DC
  Administered 2017-01-05: 19:00:00 via INTRAVENOUS
  Administered 2017-01-07: 75 mL/h via INTRAVENOUS
  Administered 2017-01-07: 06:00:00 via INTRAVENOUS

## 2017-01-05 MED ORDER — SIMETHICONE 80 MG PO CHEW: 80.0000 mg | CHEWABLE_TABLET | Freq: Four times a day (QID) | ORAL | Status: DC | PRN

## 2017-01-05 MED ORDER — LORATADINE 10 MG PO TABS
10.0000 mg | ORAL_TABLET | Freq: Every day | ORAL | Status: DC
  Administered 2017-01-06 – 2017-01-09 (×4): 10 mg via ORAL
  Filled 2017-01-05 (×4): qty 1

## 2017-01-05 MED ORDER — GUAIFENESIN ER 600 MG PO TB12
1200.0000 mg | ORAL_TABLET | Freq: Two times a day (BID) | ORAL | Status: DC
  Administered 2017-01-05 – 2017-01-09 (×8): 1200 mg via ORAL
  Filled 2017-01-05 (×8): qty 2

## 2017-01-05 MED ORDER — VANCOMYCIN HCL 10 G IV SOLR
1250.0000 mg | Freq: Once | INTRAVENOUS | Status: AC
  Administered 2017-01-05: 1250 mg via INTRAVENOUS
  Filled 2017-01-05 (×3): qty 1250

## 2017-01-05 MED ORDER — IPRATROPIUM-ALBUTEROL 0.5-2.5 (3) MG/3ML IN SOLN
3.0000 mL | RESPIRATORY_TRACT | Status: DC | PRN
  Filled 2017-01-05: qty 3

## 2017-01-05 MED ORDER — SODIUM CHLORIDE 0.9 % IV BOLUS (SEPSIS)
500.0000 mL | Freq: Once | INTRAVENOUS | Status: AC
  Administered 2017-01-05: 500 mL via INTRAVENOUS

## 2017-01-05 MED ORDER — SODIUM CHLORIDE 0.9 % IV BOLUS (SEPSIS)
250.0000 mL | Freq: Once | INTRAVENOUS | Status: AC
  Administered 2017-01-05: 250 mL via INTRAVENOUS

## 2017-01-05 MED ORDER — HEPARIN SODIUM (PORCINE) 5000 UNIT/ML IJ SOLN
5000.0000 [IU] | Freq: Three times a day (TID) | INTRAMUSCULAR | Status: DC
  Administered 2017-01-05 – 2017-01-09 (×11): 5000 [IU] via SUBCUTANEOUS
  Filled 2017-01-05 (×11): qty 1

## 2017-01-05 NOTE — Progress Notes (Addendum)
Pharmacy Antibiotic Note  Richard [name] is a 81 y.o. male admitted on 01/05/2017 with pneumonia.  Pharmacy has been consulted for vancomycin and cefepime dosing. Tmax 100.4, WBC 11.1, CrCl ~50ml/min  Plan: Vancomycin 1212m x1 then 750mg  IV every 48 hours.  Goal trough 15-20 mcg/mL.  Cefepime 2g x1 then 1g IV Q24H Monitor renal function, cultures, vanc trough at steady state  Height: 5\' 7"  (170.2 cm) Weight: 115 lb (52.2 kg) IBW/kg (Calculated) : 66.1  Temp (24hrs), Avg:98.4 F (36.9 C), Min:98.4 F (36.9 C), Max:98.4 F (36.9 C)   Recent Labs Lab 01/05/17 1307 01/05/17 1343  WBC 11.1*  --   LATICACIDVEN  --  2.52*    CrCl cannot be calculated (Patient's most recent lab result is older than the maximum 21 days allowed.).    No Known Allergies  Antimicrobials this admission: 5/31 vanc >>  5/31 cefepime >>   Dose adjustments this admission: n/a  Microbiology results: 5/31 BCx:    Thank you for allowing pharmacy to be a part of this patient's care.  Gwenlyn Perking, PharmD PGY1 Pharmacy Resident Rx ED [phone] 01/05/2017 2:04 PM

## 2017-01-05 NOTE — ED Notes (Addendum)
Attempted to contact nurse x1

## 2017-01-05 NOTE — ED Provider Notes (Signed)
[facility] DEPT Provider Note   CSN: [id number] Arrival date & time: 01/05/17  1244     History   Chief Complaint Chief Complaint  Patient presents with  . r/o sepsis  . hypotensive    HPI Richard [name] is a 81 y.o. male.  HPI   81 year old male with history of Wegner's, chronic respiratory failure, COPD, chronic kidney disease, history of anemia sent here from short stay for concern for potential sepsis.Patient report for the past 7-10 days he has developed chest congestion, cough productive with yellow sputum, occasional night sweats past denies, feeling weak, tired, having shortness of breath with exertion and pleuritic chest pain. He is on immunosuppressive therapy. He had a standing prescription for Levaquin that his doctor prescribed to use whenever he feels that he is developing pneumonia. For the past 3 days he has been taking his Levaquin with minimal improvement. Today he went for an appointment to get iron injection but upon arrival to the clinic he was found to be hypotensive and tachycardic and was sent here for further evaluation. Patient denies chills/fever, having headache, nausea vomiting diarrhea chills, urinary or bowel symptoms. His last bowel movement was yesterday. Endorse occasional heart palpitation. He does endorse extreme weakness. He did not have the opportunity to get his iron injection. States he had GI bleed last month and received a transfusion. He was taken off his Plavix and now denies having any active bleeding.    Past Medical History:  Diagnosis Date  . Adenomatous colon polyp   . Anemia   . Anemia   . Asthma   . BPH (benign prostatic hyperplasia)    sees Dr. Risa Grill, biopsy June 2015 was benign   . Bronchiectasis ([place])   . CAD (coronary artery disease)    a. [place] s/p [place] x3 and ultimately BMS to pLAD (vision BMS 3.0 x 18), POBA to CTO of Cx-OM 03/17/14 and staged Rotablator-PCI w/ BMS (Mini Vision 2.5 x 28) to mRCA on 03/21/14  . Chronic  bronchitis ([place])   . COPD (chronic obstructive pulmonary disease) ([place])   . Diverticulosis   . Femoral artery pseudo-aneurysm, right ([place])    a. s/p repair. Follow up with Dr. Kellie Simmering   . Fungal infection    lungs  . GERD (gastroesophageal reflux disease)   . Hiatal hernia   . History of blood transfusion 07/2016; 11/01/2016   "low blood; low blood"  . History of kidney stones   . HOH (hard of hearing)    bilaterally  . HTN (hypertension) 09/28/2013  . Moderate aortic stenosis by prior echocardiogram 07/08/2016   Echo 10/2015: Now Moderate AD (AVA ~1.2 cm) EF 60-65%. GR 1 DD. Echo 11/03/16:EF 60-65% with moderate LVH. Only noted grade 1 diastolic dysfunction. Moderate AS with moderate calcification and thickening. (AVA ~1.2-1.3 cm)  . Peptic stricture of esophagus   . Pneumonia    "1-2 times" (11/01/2016)  . Wegener's granulomatosis ([place])    sees Dr. [name] ; "went from my lungs to my kidneys" (11/01/2016)    Patient Active Problem List   Diagnosis Date Noted  . CAD in native artery 11/14/2016  . Melena 11/01/2016  . Malnutrition of moderate degree 08/22/2016  . Influenza A 08/21/2016  . AVM (arteriovenous malformation) of colon with hemorrhage   . Symptomatic anemia 07/30/2016  . Gastrointestinal hemorrhage with melena 07/30/2016  . Aortic valve stenosis 07/08/2016  . Protein-calorie malnutrition, severe ([place]) 04/21/2015  . CAP (community acquired pneumonia) 04/18/2015  . Chronic kidney disease,  stage IV (severe) ([place]) 03/26/2015  . Wegener's granulomatosis with renal involvement ([place]) 07/15/2014  . Pseudoaneurysm of right femoral artery ([place]) 04/08/2014  . Acute blood loss anemia 03/20/2014  . CAD- severe 4 V CAD- not CABG candidate: BMS-pLAD, mRCA &PTCA of 100% Cx-OM 03/17/2014  . Anemia 03/17/2014  . Cardiomyopathy, ischemic- EF 40% by [facility] 03/17/2014  . Abnormal nuclear stress test: Intermediate risk with moderate region of apical and inferior scar with mild superimposed  ischemia; EF 40% 03/12/2014  . Loss of weight 12/12/2013  . Abdominal pain, unspecified site 12/12/2013  . SVT (supraventricular tachycardia) ([place]) 10/04/2013  . SOB (shortness of breath) 10/04/2013  . Weakness generalized 10/04/2013  . Hypertension 09/28/2013  . Chronic rhinitis 09/27/2013  . Chronic respiratory failure ([place]) 12/26/2012  . Bronchiectasis with acute exacerbation ([place]) 09/28/2011  . OTHER DYSPHAGIA 10/25/2010  . Wegener's granulomatosis ([place]) 05/10/2010  . GERD 03/10/2010  . ARTHRITIS, CERVICAL SPINE 03/10/2010  . Hypothyroidism 11/13/2008  . VITAMIN D DEFICIENCY 11/13/2008  . Hyperlipidemia with target low density lipoprotein (LDL) cholesterol less than 70 mg/dL 11/13/2008  . ANEMIA 11/13/2008  . BPH (benign prostatic hyperplasia) 12/04/2007  . Obstructive bronchiectasis ([place]) 01/25/2007    Past Surgical History:  Procedure Laterality Date  . CARDIAC CATHETERIZATION  03/17/2014   Procedure: CORONARY BALLOON ANGIOPLASTY;  Surgeon: Troy Sine, MD;  Location: [facility] CATH LAB;  Service: Cardiovascular;;  . CATARACT EXTRACTION W/ INTRAOCULAR LENS  IMPLANT, BILATERAL  12/18/2012  . CHOLECYSTECTOMY N/A 12/21/2012   Procedure: LAPAROSCOPIC CHOLECYSTECTOMY WITH INTRAOPERATIVE CHOLANGIOGRAM;  Surgeon: Edward Jolly, MD;  Location: [facility] ORS;  Service: General;  Laterality: N/A;  . COLONOSCOPY  08-16-11   per Dr. Fuller Plan, diverticulosis and polyps, repeat in 5 yrs   . COLONOSCOPY N/A 08/01/2016   Procedure: COLONOSCOPY;  Surgeon: Carol Ada, MD;  Location: [facility] ENDOSCOPY;  Service: Endoscopy;  Laterality: N/A;  . ESOPHAGOGASTRODUODENOSCOPY N/A 07/31/2016   Procedure: ESOPHAGOGASTRODUODENOSCOPY (EGD);  Surgeon: Carol Ada, MD;  Location: [facility];  Service: Endoscopy;  Laterality: N/A;  . ESOPHAGOGASTRODUODENOSCOPY (EGD) WITH ESOPHAGEAL DILATION  11-29-10   per Dr. Fuller Plan   . HEMATOMA EVACUATION Right 03/19/2014   Procedure: Suture repair of femoral artery with evacuation  of hematoma;  Surgeon: Mal Misty, MD;  Location: [facility];  Service: Vascular;  Laterality: Right;  . INGUINAL HERNIA REPAIR Right ~ 2002  . LEFT HEART CATHETERIZATION WITH CORONARY ANGIOGRAM N/A 03/14/2014   Procedure: LEFT HEART CATHETERIZATION WITH CORONARY ANGIOGRAM;  Surgeon: Leonie Man, MD;  Location: [facility] CATH LAB: pLAD 90% calcified lesion --> after D1, LAD is Subtotally occluded. mCx 100% (L-L collaterals). pRI 60-80%. mRCA diffuse 80-95%.  Marland Kitchen LUNG BIOPSY  03/2014  . PERCUTANEOUS CORONARY STENT INTERVENTION (PCI-S) N/A 03/17/2014   Procedure: PERCUTANEOUS CORONARY STENT INTERVENTION (PCI-S);  Surgeon: Troy Sine, MD;  Location: [facility] CATH LAB: Vision BMS 3.0 mm x 18 mm -- 3.2 mm); PTCA of CTO Cx-OM  . PERCUTANEOUS CORONARY STENT INTERVENTION (PCI-S) N/A 03/21/2014   Procedure: PERCUTANEOUS CORONARY STENT INTERVENTION (PCI-S);  Surgeon: Burnell Blanks, MD;  Location: [facility] CATH LAB:  Rotational Atherectomy -- Mini [place] BMS 2.5 x 28 mm --> 2.75 mm)  . RENAL BIOPSY    . TRANSTHORACIC ECHOCARDIOGRAM  10/2015; 10/2016   a. Now Moderate AD (AVA ~1.2 cm) EF 60-65%. GR 1 DD.;; b. Stable -- EF 60-65% with moderate LVH. Only noted grade 1 diastolic dysfunction. Moderate aortic stenosis with moderate calcification and thickening. (Estimated valve area between 1.2-1.3 cm)  Home Medications    Prior to Admission medications   Medication Sig Start Date End Date Taking? Authorizing Provider  Acetaminophen (TYLENOL) 325 MG CAPS Take 325 mg by mouth every 6 (six) hours as needed (pain). TAKE PER BOX AS NEEDED FOR PAIN     [provider]  albuterol (VENTOLIN HFA) 108 (90 Base) MCG/ACT inhaler Inhale 1-2 puffs into the lungs every 4 (four) hours as needed for wheezing or shortness of breath. 07/12/16   Tanda Rockers, MD  aspirin EC 81 MG tablet Take 81 mg by mouth daily.    [provider]  b complex vitamins tablet Take 1 tablet by mouth daily.      [provider]  bisacodyl (DULCOLAX) 5 MG EC tablet Take 5 mg by mouth 2 (two) times daily.     [provider]  calcitRIOL (ROCALTROL) 0.25 MCG capsule Take 0.25 mcg by mouth daily.  09/06/15   [provider]  cyclophosphamide (CYTOXAN) 25 MG capsule Take 25 mg by mouth daily. Take one capsule by mouth daily on an empty stomach 1 hour before or 2 hours after meal. 07/26/16   [provider]  dextromethorphan-guaiFENesin (MUCINEX DM) 30-600 MG 12hr tablet Take 1 tablet by mouth 2 (two) times daily.    [provider]  finasteride (PROSCAR) 5 MG tablet Take 1 tablet (5 mg total) by mouth daily. Patient not taking: Reported on 12/13/2016 11/18/16   [name], MD  guaiFENesin-codeine 100-10 MG/5ML syrup Take 10 mLs by mouth every 6 (six) hours as needed for cough. 08/23/16   Eber Jones, MD  ipratropium-albuterol (DUONEB) 0.5-2.5 (3) MG/3ML SOLN Take 3 mLs by nebulization every 4 (four) hours as needed (if inhaler is not effective in resolving shortness of breath or wheezing). Patient taking differently: Take 3 mLs by nebulization every 4 (four) hours as needed (WITH FLUTTER VALVE FOR WHEEZING, SHORTNESS OF BREATH-PLAN C).  02/03/16   Tanda Rockers, MD  loratadine (CLARITIN) 10 MG tablet Take 10 mg by mouth daily.     [provider]  Methylcellulose, Laxative, (CITRUCEL PO) Take 1 packet by mouth daily.    [provider]  metoprolol succinate (TOPROL-XL) 25 MG 24 hr tablet Take 1 tablet (25 mg total) by mouth daily. 07/18/16   Hilty, Nadean Corwin, MD  nitroGLYCERIN (NITROSTAT) 0.4 MG SL tablet Place 1 tablet (0.4 mg total) under the tongue every 5 (five) minutes x 3 doses as needed for chest pain. Patient not taking: Reported on 12/13/2016 03/24/15   Pixie Casino, MD  ondansetron (ZOFRAN) 4 MG tablet Take 1 tablet (4 mg total) by mouth every 8 (eight) hours as needed for nausea or vomiting. 12/02/16   Margarita Mail, PA-C  oxyCODONE-acetaminophen  (PERCOCET) 10-325 MG tablet Take 1 tablet by mouth every 6 (six) hours as needed for pain. 12/22/16   [name], MD  pantoprazole (PROTONIX) 40 MG tablet Take 40 mg by mouth daily. 11/04/16   [provider]  predniSONE (DELTASONE) 5 MG tablet Take 7.5 mg by mouth daily with breakfast.    [provider]  ranitidine (ZANTAC) 300 MG tablet Take 1 tablet (300 mg total) by mouth 2 (two) times daily. 11/18/16   [name], MD  simethicone (MYLICON) 644 MG chewable tablet Chew 250 mg by mouth 2 (two) times daily as needed for flatulence. TAKE PER BOX FOR GAS     [provider]  simvastatin (ZOCOR) 20 MG tablet TAKE  ONE TABLET BY MOUTH ONCE DAILY AT  6  PM 07/07/16   Hilty, Nadean Corwin, MD  sodium bicarbonate 650 MG tablet Take 2 tablets (1,300 mg total) by mouth 2 (two) times daily. NEED OV. 08/12/15   Pixie Casino, MD  sodium chloride (OCEAN) 0.65 % SOLN nasal spray Place 1 spray into both nostrils as needed for congestion.    [provider]  SYMBICORT 160-4.5 MCG/ACT inhaler INHALE TWO PUFFS BY MOUTH TWICE DAILY 06/01/16   Tanda Rockers, MD  tamsulosin (FLOMAX) 0.4 MG CAPS capsule TAKE ONE CAPSULE BY MOUTH ONCE DAILY IN THE EVENING 07/05/16   [name], MD    Family History Family History  Problem Relation Age of Onset  . Asthma Father   . Coronary artery disease Mother   . Coronary artery disease Brother   . Coronary artery disease Brother   . Colon cancer Neg Hx   . Stomach cancer Neg Hx     Social History Social History  Substance Use Topics  . Smoking status: Never Smoker  . Smokeless tobacco: Never Used  . Alcohol use No     Allergies   Patient has no known allergies.   Review of Systems Review of Systems  All other systems reviewed and are negative.    Physical Exam Updated Vital Signs BP (S) (!) 93/51   Pulse (S) (!) 128   Temp 98.4 F (36.9 C) (Oral)   Resp (!) 22   Wt 52.2 kg (115 lb)   SpO2 97%   BMI 18.01  kg/m   Physical Exam  Constitutional: He is oriented to person, place, and time. He appears well-developed and well-nourished. No distress.  Elderly male, ill-appearing, laying in bed.  HENT:  Head: Atraumatic.  Mouth/Throat: Oropharynx is clear and moist.  Eyes: Conjunctivae are normal.  Neck: Neck supple. No JVD present.  No nuchal rigidity  Cardiovascular: Normal rate, regular rhythm and intact distal pulses.   Murmur (3 out 6 systolic murmur.) heard. Pulmonary/Chest:  Faint scattered rhonchi with crackles at the lung base.  Abdominal: Soft. He exhibits no distension. There is no tenderness.  Musculoskeletal: He exhibits no edema.  Neurological: He is alert and oriented to person, place, and time.  Skin: Skin is dry. No rash noted. There is pallor.  Psychiatric: He has a normal mood and affect.  Nursing note and vitals reviewed.    ED Treatments / Results  Labs (all labs ordered are listed, but only abnormal results are displayed) Labs Reviewed  COMPREHENSIVE METABOLIC PANEL - Abnormal; Notable for the following:       Result Value   Glucose, Bld 101 (*)    Creatinine, Ser 2.34 (*)    Total Protein 6.1 (*)    Albumin 3.1 (*)    ALT 16 (*)    GFR calc non Af Amer 25 (*)    GFR calc Af Amer 29 (*)    All other components within normal limits  CBC WITH DIFFERENTIAL/PLATELET - Abnormal; Notable for the following:    WBC 11.1 (*)    RBC 2.66 (*)    Hemoglobin 8.9 (*)    HCT 27.7 (*)    MCV 104.1 (*)    RDW 16.7 (*)    Neutro Abs 9.0 (*)    Lymphs Abs 0.5 (*)    Monocytes Absolute 1.4 (*)    All other components within normal limits  I-STAT CG4 LACTIC ACID, ED - Abnormal; Notable for the following:  Lactic Acid, Venous 2.52 (*)    All other components within normal limits  CULTURE, BLOOD (ROUTINE X 2)  CULTURE, BLOOD (ROUTINE X 2)  URINE CULTURE  PROTIME-INR  URINALYSIS, ROUTINE W REFLEX MICROSCOPIC  PROCALCITONIN  APTT    EKG  EKG  Interpretation  Date/Time:  Thursday Jan 05 2017 12:55:40 EDT Ventricular Rate:  128 PR Interval:    QRS Duration: 88 QT Interval:  336 QTC Calculation: 490 R Axis:   41 Text Interpretation:  Accelerated Junctional rhythm with retrograde conduction with occasional Premature ventricular complexes Anteroseptal infarct , age undetermined Abnormal ECG Since prior ECG, rhythm no longer appears sinus Confirmed by [facility] MD, [place] ([id number]) on 01/05/2017 2:18:43 PM       Radiology Dg Chest 2 View  Result Date: 01/05/2017 CLINICAL DATA:  Weakness and fatigue and cough for the past 10 days. History of COPD, bronchiectasis, previous episodes of pneumonia, Wegner's granulomatosis. EXAM: CHEST  2 VIEW COMPARISON:  Chest x-ray of April 27th 2018 FINDINGS: The lungs remain hyperinflated. The interstitial markings are increased bilaterally and on the left are more conspicuous in the mid lung field. There is no alveolar infiltrate. The heart and pulmonary vascularity are normal. There is calcification in the wall of the aortic arch. There is a hiatal hernia. The bony thorax exhibits no acute abnormality. IMPRESSION: Chronic bronchitic change and it interstitial change. Superimposed new interstitial infiltrate in the left mid lung which may reflect mild interstitial pneumonia or active inflammatory disease. Electronically Signed   By: David  Martinique M.D.   On: 01/05/2017 14:08    Procedures Procedures (including critical care time)  Medications Ordered in ED Medications  sodium chloride 0.9 % bolus 1,000 mL (1,000 mLs Intravenous New Bag/Given 01/05/17 1420)    And  sodium chloride 0.9 % bolus 500 mL (not administered)    And  sodium chloride 0.9 % bolus 250 mL (not administered)  ceFEPIme (MAXIPIME) 2 g in dextrose 5 % 50 mL IVPB (2 g Intravenous New Bag/Given 01/05/17 1420)  vancomycin (VANCOCIN) 1,250 mg in sodium chloride 0.9 % 250 mL IVPB (not administered)     Initial Impression / Assessment and  Plan / ED Course  I have reviewed the triage vital signs and the nursing notes.  Pertinent labs & imaging results that were available during my care of the patient were reviewed by me and considered in my medical decision making (see chart for details).     BP 112/70   Pulse 98   Temp (!) 100.4 F (38 C) (Rectal)   Resp 16   Ht 5\' 7"  (1.702 m)   Wt 52.2 kg (115 lb)   SpO2 100%   BMI 18.01 kg/m    Final Clinical Impressions(s) / ED Diagnoses   Final diagnoses:  HCAP (healthcare-associated pneumonia)  Dehydration  Symptomatic anemia    New Prescriptions New Prescriptions   No medications on file   2:34 PM Elderly male here with generalized weakness, productive cough, documented temperature of 100.4, and a chest x-ray with findings suggestive of pneumonia. Code sepsis initiated. Patient will be treated with Cefepime and Vancomycin for HCAP. Aggressive IV hydration with normal saline at 30 ML per kilogram. Current hemoglobin is 8.9. Mildly elevated white count of 11.1, elevated lactic acid of 2.52, evidence of chronic renal disease with a creatinine of 2.34 and a GFR of 29. EKG shows an accelerated junctional rhythm without concerning arrhythmia.  Will consult for admission.  3:10 PM Appreciate consultation from [facility]  Dr. Marily Memos who agrees to see pt in the ER and will admit for further management.  Pt is agreeable with plan.  He is currently receiving IVF and abx.    CRITICAL CARE Performed by: Domenic Moras Total critical care time: 35 minutes Critical care time was exclusive of separately billable procedures and treating other patients. Critical care was necessary to treat or prevent imminent or life-threatening deterioration. Critical care was time spent personally by me on the following activities: development of treatment plan with patient and/or surrogate as well as nursing, discussions with consultants, evaluation of patient's response to treatment,  examination of patient, obtaining history from patient or surrogate, ordering and performing treatments and interventions, ordering and review of laboratory studies, ordering and review of radiographic studies, pulse oximetry and re-evaluation of patient's condition.    Domenic Moras, PA-C 01/05/17 1532    Gareth Morgan, MD 01/09/17 [phone]

## 2017-01-05 NOTE — Progress Notes (Signed)
Patient ID: Richard [name], male   DOB: 11-13-1935, 81 y.o.   MRN: [id number]   IR is aware of request for possible Thoracic 7 Vertebroplasty Will review with Radiologist 6/1 am  Note in chart will follow with recommendation

## 2017-01-05 NOTE — Progress Notes (Signed)
Pt came in slouched over in the wheelchair and stated he " felt weak as water" .  His heart rate was 133 and his BP was 91/52.  Called Dr Deterding's office and reported patients condition and VS and sent pt to the ER to be evalutated.

## 2017-01-05 NOTE — H&P (Signed)
History and Physical    Richard [name] YQM:[id number] DOB: 01/22/1936 DOA: 01/05/2017  PCP: [name], MD Patient coming from: short stay  Chief Complaint: hopytension  HPI: Richard [name] is a 81 y.o. male with medical history significant of BPH, CAD, COPD, diverticulosis, anemia, GERD, hiatal hernia, Wegener's granulomatosis, PUD, hypertension, kidney stones.  Patient presented to short stay Center at [facility] for IV iron infusion when nursing staff noticed patient was slumped over his wheelchair. When asked patient how he was doing he stated "feel weak as water." Patient was noted to be tachycardic with a heart rate of 133 and a blood pressure of 91/52. Patient stricture to the emergency room for emergent evaluation. Patient endorses a 7-10 day history of worsening chest congestion with productive cough night sweats, progressive weakness and shortness of breath. Patient endorses exertional pleuritic chest pain. Patient started taking Levaquin 3 days ago as he has a standing order for this due to his condition. No improvement on Levaquin.  Patient with an additional complaint of upper back, lower neck pain. This started on approximately 12/23/2016. As patient was attempting to dress that morning he noted significant pain in his upper back and lower neck region. Worse with movement. Patient unable to lay down on his back as he normally does. Patient thinks this may have the results of exerting himself physically the day prior while mowing the lawn and having to lift several heavy objects.  Of note patient with recent admission for GI bleed for which he stopped his Plavix and has had no further episodes of GI bleed.    ED Course: Patient given fluid boluses and started on broad-spectrum antibiotics.  Review of Systems: As per HPI otherwise all other systems reviewed and are negative  Ambulatory Status: limited due to back fracture.   Past Medical History:  Diagnosis Date    . Adenomatous colon polyp   . Anemia   . Anemia   . Asthma   . BPH (benign prostatic hyperplasia)    sees Dr. Risa Grill, biopsy June 2015 was benign   . Bronchiectasis ([place])   . CAD (coronary artery disease)    a. [place] s/p Addy x3 and ultimately BMS to pLAD (vision BMS 3.0 x 18), POBA to CTO of Cx-OM 03/17/14 and staged Rotablator-PCI w/ BMS (Mini Vision 2.5 x 28) to mRCA on 03/21/14  . Chronic bronchitis ([place])   . COPD (chronic obstructive pulmonary disease) ([place])   . Diverticulosis   . Femoral artery pseudo-aneurysm, right ([place])    a. s/p repair. Follow up with Dr. Kellie Simmering   . Fungal infection    lungs  . GERD (gastroesophageal reflux disease)   . Hiatal hernia   . History of blood transfusion 07/2016; 11/01/2016   "low blood; low blood"  . History of kidney stones   . HOH (hard of hearing)    bilaterally  . HTN (hypertension) 09/28/2013  . Moderate aortic stenosis by prior echocardiogram 07/08/2016   Echo 10/2015: Now Moderate AD (AVA ~1.2 cm) EF 60-65%. GR 1 DD. Echo 11/03/16:EF 60-65% with moderate LVH. Only noted grade 1 diastolic dysfunction. Moderate AS with moderate calcification and thickening. (AVA ~1.2-1.3 cm)  . Peptic stricture of esophagus   . Pneumonia    "1-2 times" (11/01/2016)  . Wegener's granulomatosis ([place])    sees Dr. [name] ; "went from my lungs to my kidneys" (11/01/2016)    Past Surgical History:  Procedure Laterality Date  . CARDIAC CATHETERIZATION  03/17/2014  Procedure: CORONARY BALLOON ANGIOPLASTY;  Surgeon: Troy Sine, MD;  Location: [facility] CATH LAB;  Service: Cardiovascular;;  . CATARACT EXTRACTION W/ INTRAOCULAR LENS  IMPLANT, BILATERAL  12/18/2012  . CHOLECYSTECTOMY N/A 12/21/2012   Procedure: LAPAROSCOPIC CHOLECYSTECTOMY WITH INTRAOPERATIVE CHOLANGIOGRAM;  Surgeon: Edward Jolly, MD;  Location: [facility] ORS;  Service: General;  Laterality: N/A;  . COLONOSCOPY  08-16-11   per Dr. Fuller Plan, diverticulosis and polyps, repeat in 5 yrs   . COLONOSCOPY N/A  08/01/2016   Procedure: COLONOSCOPY;  Surgeon: Carol Ada, MD;  Location: [facility] ENDOSCOPY;  Service: Endoscopy;  Laterality: N/A;  . ESOPHAGOGASTRODUODENOSCOPY N/A 07/31/2016   Procedure: ESOPHAGOGASTRODUODENOSCOPY (EGD);  Surgeon: Carol Ada, MD;  Location: [facility];  Service: Endoscopy;  Laterality: N/A;  . ESOPHAGOGASTRODUODENOSCOPY (EGD) WITH ESOPHAGEAL DILATION  11-29-10   per Dr. Fuller Plan   . HEMATOMA EVACUATION Right 03/19/2014   Procedure: Suture repair of femoral artery with evacuation of hematoma;  Surgeon: Mal Misty, MD;  Location: [place];  Service: Vascular;  Laterality: Right;  . INGUINAL HERNIA REPAIR Right ~ 2002  . LEFT HEART CATHETERIZATION WITH CORONARY ANGIOGRAM N/A 03/14/2014   Procedure: LEFT HEART CATHETERIZATION WITH CORONARY ANGIOGRAM;  Surgeon: Leonie Man, MD;  Location: [facility] CATH LAB: pLAD 90% calcified lesion --> after D1, LAD is Subtotally occluded. mCx 100% (L-L collaterals). pRI 60-80%. mRCA diffuse 80-95%.  Marland Kitchen LUNG BIOPSY  03/2014  . PERCUTANEOUS CORONARY STENT INTERVENTION (PCI-S) N/A 03/17/2014   Procedure: PERCUTANEOUS CORONARY STENT INTERVENTION (PCI-S);  Surgeon: Troy Sine, MD;  Location: [facility] CATH LAB: Vision BMS 3.0 mm x 18 mm -- 3.2 mm); PTCA of CTO Cx-OM  . PERCUTANEOUS CORONARY STENT INTERVENTION (PCI-S) N/A 03/21/2014   Procedure: PERCUTANEOUS CORONARY STENT INTERVENTION (PCI-S);  Surgeon: Burnell Blanks, MD;  Location: [facility] CATH LAB:  Rotational Atherectomy -- Mini Trek BMS 2.5 x 28 mm --> 2.75 mm)  . RENAL BIOPSY    . TRANSTHORACIC ECHOCARDIOGRAM  10/2015; 10/2016   a. Now Moderate AD (AVA ~1.2 cm) EF 60-65%. GR 1 DD.;; b. Stable -- EF 60-65% with moderate LVH. Only noted grade 1 diastolic dysfunction. Moderate aortic stenosis with moderate calcification and thickening. (Estimated valve area between 1.2-1.3 cm)    Social History   Social History  . Marital status: Married    Spouse name: N/A  . Number of children: N/A  . Years of  education: N/A   Occupational History  . Retired    Social History Main Topics  . Smoking status: Never Smoker  . Smokeless tobacco: Never Used  . Alcohol use No  . Drug use: No  . Sexual activity: Not on file   Other Topics Concern  . Not on file   Social History Narrative  . No narrative on file    No Known Allergies  Family History  Problem Relation Age of Onset  . Asthma Father   . Coronary artery disease Mother   . Coronary artery disease Brother   . Coronary artery disease Brother   . Colon cancer Neg Hx   . Stomach cancer Neg Hx     Prior to Admission medications   Medication Sig Start Date End Date Taking? Authorizing Provider  Acetaminophen (TYLENOL) 325 MG CAPS Take 325 mg by mouth every 6 (six) hours as needed (pain). TAKE PER BOX AS NEEDED FOR PAIN     [provider]  albuterol (VENTOLIN HFA) 108 (90 Base) MCG/ACT inhaler Inhale 1-2 puffs into the lungs every 4 (four) hours  as needed for wheezing or shortness of breath. 07/12/16   Tanda Rockers, MD  aspirin EC 81 MG tablet Take 81 mg by mouth daily.    [provider]  b complex vitamins tablet Take 1 tablet by mouth daily.      [provider]  bisacodyl (DULCOLAX) 5 MG EC tablet Take 5 mg by mouth 2 (two) times daily.     [provider]  calcitRIOL (ROCALTROL) 0.25 MCG capsule Take 0.25 mcg by mouth daily.  09/06/15   [provider]  cyclophosphamide (CYTOXAN) 25 MG capsule Take 25 mg by mouth daily. Take one capsule by mouth daily on an empty stomach 1 hour before or 2 hours after meal. 07/26/16   [provider]  dextromethorphan-guaiFENesin (MUCINEX DM) 30-600 MG 12hr tablet Take 1 tablet by mouth 2 (two) times daily.    [provider]  finasteride (PROSCAR) 5 MG tablet Take 1 tablet (5 mg total) by mouth daily. Patient not taking: Reported on 12/13/2016 11/18/16   [name], MD  guaiFENesin-codeine 100-10 MG/5ML syrup Take 10 mLs by mouth  every 6 (six) hours as needed for cough. 08/23/16   Eber Jones, MD  ipratropium-albuterol (DUONEB) 0.5-2.5 (3) MG/3ML SOLN Take 3 mLs by nebulization every 4 (four) hours as needed (if inhaler is not effective in resolving shortness of breath or wheezing). Patient taking differently: Take 3 mLs by nebulization every 4 (four) hours as needed (WITH FLUTTER VALVE FOR WHEEZING, SHORTNESS OF BREATH-PLAN C).  02/03/16   Tanda Rockers, MD  loratadine (CLARITIN) 10 MG tablet Take 10 mg by mouth daily.     [provider]  Methylcellulose, Laxative, (CITRUCEL PO) Take 1 packet by mouth daily.    [provider]  metoprolol succinate (TOPROL-XL) 25 MG 24 hr tablet Take 1 tablet (25 mg total) by mouth daily. 07/18/16   Hilty, Nadean Corwin, MD  nitroGLYCERIN (NITROSTAT) 0.4 MG SL tablet Place 1 tablet (0.4 mg total) under the tongue every 5 (five) minutes x 3 doses as needed for chest pain. Patient not taking: Reported on 12/13/2016 03/24/15   Pixie Casino, MD  ondansetron (ZOFRAN) 4 MG tablet Take 1 tablet (4 mg total) by mouth every 8 (eight) hours as needed for nausea or vomiting. 12/02/16   Margarita Mail, PA-C  oxyCODONE-acetaminophen (PERCOCET) 10-325 MG tablet Take 1 tablet by mouth every 6 (six) hours as needed for pain. 12/22/16   [name], MD  pantoprazole (PROTONIX) 40 MG tablet Take 40 mg by mouth daily. 11/04/16   [provider]  predniSONE (DELTASONE) 5 MG tablet Take 7.5 mg by mouth daily with breakfast.    [provider]  ranitidine (ZANTAC) 300 MG tablet Take 1 tablet (300 mg total) by mouth 2 (two) times daily. 11/18/16   [name], MD  simethicone (MYLICON) 902 MG chewable tablet Chew 250 mg by mouth 2 (two) times daily as needed for flatulence. TAKE PER BOX FOR GAS     [provider]  simvastatin (ZOCOR) 20 MG tablet TAKE ONE TABLET BY MOUTH ONCE DAILY AT  6  PM 07/07/16   Hilty, Nadean Corwin, MD  sodium bicarbonate 650 MG tablet  Take 2 tablets (1,300 mg total) by mouth 2 (two) times daily. NEED OV. 08/12/15   Pixie Casino, MD  sodium chloride (OCEAN) 0.65 % SOLN nasal spray Place 1 spray into both nostrils as needed for congestion.    [provider]  SYMBICORT 160-4.5 MCG/ACT inhaler INHALE TWO PUFFS BY MOUTH TWICE DAILY 06/01/16   Tanda Rockers, MD  tamsulosin (FLOMAX) 0.4 MG CAPS capsule TAKE ONE CAPSULE BY MOUTH ONCE DAILY IN THE EVENING 07/05/16   [name], MD    Physical Exam: Vitals:   01/05/17 1440 01/05/17 1520 01/05/17 1600 01/05/17 1658  BP: (!) 100/52 (!) 107/54 112/66   Pulse:  94 90   Resp: 18 20 (!) 33   Temp:    98.7 F (37.1 C)  TempSrc:      SpO2:  97% 98%   Weight:    50.3 kg (110 lb 14.3 oz)  Height:    5\' 7"  (1.702 m)     General: Frail and cachectic appearing, sleeping in bed. Easily arousable. Eyes:  PERRL, EOMI, normal lids, iris ENT: Hard of hearing, dry mucous membranes. Neck:  no LAD, masses or thyromegaly Cardiovascular:  RRR, 194 musical systolic murmur. No LE edema.  Respiratory: Crackles in the bases bilaterally with few rhonchi. Increased effort. Audible intermittent wheezing. Abdomen:  soft, ntnd, NABS Skin:  no rash or induration seen on limited exam Musculoskeletal:  Tender to palpation and with certain movements along the mid upper back region. No specific bony abnormalities appreciated. Grossly normal upper and lower extremity tone.  Psychiatric:  grossly normal mood and affect, speech fluent and appropriate, AOx3 Neurologic:  CN 2-12 grossly intact, moves all extremities in coordinated fashion, sensation intact  Labs on Admission: I have personally reviewed following labs and imaging studies  CBC:  Recent Labs Lab 01/05/17 1307  WBC 11.1*  NEUTROABS 9.0*  HGB 8.9*  HCT 27.7*  MCV 104.1*  PLT 174   Basic Metabolic Panel:  Recent Labs Lab 01/05/17 1307  NA 141  K 4.1  CL 105  CO2 26  GLUCOSE 101*  BUN 20  CREATININE 2.34*    CALCIUM 9.2   GFR: Estimated Creatinine Clearance: 18.5 mL/min (A) (by C-G formula based on SCr of 2.34 mg/dL (H)). Liver Function Tests:  Recent Labs Lab 01/05/17 1307  AST 23  ALT 16*  ALKPHOS 98  BILITOT 0.7  PROT 6.1*  ALBUMIN 3.1*   No results for input(s): LIPASE, AMYLASE in the last 168 hours. No results for input(s): AMMONIA in the last 168 hours. Coagulation Profile:  Recent Labs Lab 01/05/17 1307  INR 1.09   Cardiac Enzymes: No results for input(s): CKTOTAL, CKMB, CKMBINDEX, TROPONINI in the last 168 hours. BNP (last 3 results) No results for input(s): PROBNP in the last 8760 hours. HbA1C: No results for input(s): HGBA1C in the last 72 hours. CBG: No results for input(s): GLUCAP in the last 168 hours. Lipid Profile: No results for input(s): CHOL, HDL, LDLCALC, TRIG, CHOLHDL, LDLDIRECT in the last 72 hours. Thyroid Function Tests: No results for input(s): TSH, T4TOTAL, FREET4, T3FREE, THYROIDAB in the last 72 hours. Anemia Panel: No results for input(s): VITAMINB12, FOLATE, FERRITIN, TIBC, IRON, RETICCTPCT in the last 72 hours. Urine analysis:    Component Value Date/Time   COLORURINE YELLOW 01/05/2017 1533   APPEARANCEUR HAZY (A) 01/05/2017 1533   LABSPEC 1.009 01/05/2017 1533   PHURINE 8.0 01/05/2017 1533   GLUCOSEU NEGATIVE 01/05/2017 1533   GLUCOSEU NEGATIVE 07/26/2013 1142   HGBUR NEGATIVE 01/05/2017 1533   HGBUR trace-intact 11/13/2008 0000   BILIRUBINUR NEGATIVE 01/05/2017 1533   BILIRUBINUR neg 05/16/2012 [place] 01/05/2017 1533   PROTEINUR NEGATIVE 01/05/2017 1533   UROBILINOGEN 0.2 03/25/2014 1637   NITRITE NEGATIVE  01/05/2017 1533   LEUKOCYTESUR NEGATIVE 01/05/2017 1533    Creatinine Clearance: Estimated Creatinine Clearance: 18.5 mL/min (A) (by C-G formula based on SCr of 2.34 mg/dL (H)).  Sepsis Labs: @LABRCNTIP (procalcitonin:4,lacticidven:4) )No results found for this or any previous visit (from the past 240  hour(s)).   Radiological Exams on Admission: Dg Chest 2 View  Result Date: 01/05/2017 CLINICAL DATA:  Weakness and fatigue and cough for the past 10 days. History of COPD, bronchiectasis, previous episodes of pneumonia, Wegner's granulomatosis. EXAM: CHEST  2 VIEW COMPARISON:  Chest x-ray of April 27th 2018 FINDINGS: The lungs remain hyperinflated. The interstitial markings are increased bilaterally and on the left are more conspicuous in the mid lung field. There is no alveolar infiltrate. The heart and pulmonary vascularity are normal. There is calcification in the wall of the aortic arch. There is a hiatal hernia. The bony thorax exhibits no acute abnormality. IMPRESSION: Chronic bronchitic change and it interstitial change. Superimposed new interstitial infiltrate in the left mid lung which may reflect mild interstitial pneumonia or active inflammatory disease. Electronically Signed   By: Garik Diamant  [place] M.D.   On: 01/05/2017 14:08    EKG: Independently reviewed. Junctional rhythm. PVC. No ACS   Assessment/Plan Active Problems:   Wegener's granulomatosis ([place])   Sepsis ([place])   Lobar pneumonia ([place])   Compression fracture of thoracic spine, non-traumatic (HCC)   AKI (acute kidney injury) ([place])   Hypotension   Sepsis: Likely from healthcare associated pneumonia. Pulse rate 128, respiratory rate 33, hypotension, fever to 100.4. Lactic acid 2.52, WBC 11.1. CXR as above. - Vancomycin and cefepime - Sepsis protocol - continue Vanc/Cef - Follow up on blood cultures - O2 when necessary - Mucinex, Tessalon Perles - Continue symbicort, duoneb - on chronic steroids for wegeners. Consider stress dose steroids if pressure drops.   Thoracic spine compression fracture: Patient with MRI on 12/30/2016 with severe compression fracture and retropulsion of the bone. Discussed MRI findings with interventional radiology who has graciously agreed to discuss the case with the neurosurgery team for further  evaluation and possible surgical management. Patient likely sustained this injury while exerting himself mowing his lawn on 12/23/2016. Portion of a patient without any neurological symptoms - Scheduled Tylenol - Tramadol, Flexeril when necessary - Fentanyl when necessary - f/u Neurosurgery or IR recs  AoCKD: 22.3. Baseline 1.6. Likely secondary to sepsis and hypoperfusion. - IVF - BMET in am  Chronic anemia: Likely secondary to ongoing renal failure and host of other chronic medical conditions. Patient with recent admission for GI bleed. They signed hemoglobin around 11. Currently 8.9. - CBC in am and transfuse if less than 8 - Type and Screen.  - Pt presented ot short stay on day of admission for iron infusion. Consider transfusion prior to discharge after resolution of sepsis  GERD: - continue pepcid, PPI  HTN: hypotensive on presentation - hold home Metoprolol  CAD: s/p catha nd stent placement - continue ASA, Statin - Plavix held during last admission due to GI bleed.   BPH: - continue proscar, flomax,   HLD: - copntinue statin  Wegeners:  - continue prednisone, Cytoxan    DVT prophylaxis: Hep  Code Status: full  Family Communication: wife  Disposition Plan: pending improvement nad eval by Neurosurgery  Consults called: neurosurgery  Admission status: inpt    Jaicey Sweaney J MD [facility]  If 7PM-7AM, please contact night-coverage [email or web address] Password [facility]  01/05/2017, 5:24 PM

## 2017-01-05 NOTE — ED Triage Notes (Signed)
Pt sent here from short stay-- was supposed to have a procwedure today-- pt is weak, unable tostay sitting up, hypotensive, on levaquin for 3 days for "congestion"

## 2017-01-06 DIAGNOSIS — D638 Anemia in other chronic diseases classified elsewhere: Secondary | ICD-10-CM

## 2017-01-06 DIAGNOSIS — N138 Other obstructive and reflux uropathy: Secondary | ICD-10-CM

## 2017-01-06 DIAGNOSIS — N401 Enlarged prostate with lower urinary tract symptoms: Secondary | ICD-10-CM

## 2017-01-06 DIAGNOSIS — I959 Hypotension, unspecified: Secondary | ICD-10-CM

## 2017-01-06 DIAGNOSIS — N184 Chronic kidney disease, stage 4 (severe): Secondary | ICD-10-CM

## 2017-01-06 LAB — BASIC METABOLIC PANEL
ANION GAP: 7 (ref 5–15)
BUN: 20 mg/dL (ref 6–20)
CALCIUM: 8.1 mg/dL — AB (ref 8.9–10.3)
CO2: 23 mmol/L (ref 22–32)
CREATININE: 2.01 mg/dL — AB (ref 0.61–1.24)
Chloride: 114 mmol/L — ABNORMAL HIGH (ref 101–111)
GFR calc Af Amer: 35 mL/min — ABNORMAL LOW (ref >60)
GFR, EST NON AFRICAN AMERICAN: 30 mL/min — AB (ref >60)
GLUCOSE: 84 mg/dL (ref 65–99)
Potassium: 4 mmol/L (ref 3.5–5.1)
Sodium: 144 mmol/L (ref 135–145)

## 2017-01-06 LAB — CBC
HEMATOCRIT: 24.3 % — AB (ref 39.0–52.0)
Hemoglobin: 7.7 g/dL — ABNORMAL LOW (ref 13.0–17.0)
MCH: 33.2 pg (ref 26.0–34.0)
MCHC: 31.7 g/dL (ref 30.0–36.0)
MCV: 104.7 fL — AB (ref 78.0–100.0)
PLATELETS: 172 10*3/uL (ref 150–400)
RBC: 2.32 MIL/uL — ABNORMAL LOW (ref 4.22–5.81)
RDW: 16.7 % — AB (ref 11.5–15.5)
WBC: 4.6 10*3/uL (ref 4.0–10.5)

## 2017-01-06 LAB — STREP PNEUMONIAE URINARY ANTIGEN: Strep Pneumo Urinary Antigen: NEGATIVE

## 2017-01-06 LAB — HEMOGLOBIN AND HEMATOCRIT, BLOOD
HEMATOCRIT: 26.3 % — AB (ref 39.0–52.0)
Hemoglobin: 8.4 g/dL — ABNORMAL LOW (ref 13.0–17.0)

## 2017-01-06 LAB — URINE CULTURE: CULTURE: NO GROWTH

## 2017-01-06 LAB — HIV ANTIBODY (ROUTINE TESTING W REFLEX): HIV Screen 4th Generation wRfx: NONREACTIVE

## 2017-01-06 MED ORDER — BISACODYL 5 MG PO TBEC
5.0000 mg | DELAYED_RELEASE_TABLET | Freq: Two times a day (BID) | ORAL | Status: DC
  Administered 2017-01-06 – 2017-01-07 (×3): 5 mg via ORAL
  Filled 2017-01-06 (×4): qty 1

## 2017-01-06 NOTE — Progress Notes (Signed)
Orthopedic Tech Progress Note Patient Details:  Richard [name] [birth year] [id number]  Patient ID: Richard [name], male   DOB: 11/14/1935, 81 y.o.   MRN: [id number]   [name] 01/06/2017, 2:19 PM Called in bio-tech brace order;spoke with Bella Kennedy

## 2017-01-06 NOTE — Progress Notes (Signed)
No noted stool x 3 days. Requesting for stool laxative. thanks

## 2017-01-06 NOTE — Consult Note (Signed)
CC:  Chief Complaint  Patient presents with  . r/o sepsis  . hypotensive    HPI: Richard [name] is a 81 y.o. male who was admitted yesterday for sepsis secondary to pneumonia. Recently seen for thoracic back pain prior to admission and found to have T7 compression fracture on MRI. With his admission, IR was consulted for possible management, but due to retropulsion, deferred management to NS.   Back pain started 2-3 weeks ago. He was lifting a 5-gallon full gas can over his car and "tweaked" his back. Pain progressively got worse and was a 10/10, worse with laying flat. Was given narcotics at onset, but took for several days and then d/c due to improvement of pain. States his pain continues to improve and is now minimal.  He is able to lay flat for longer periods of time without pain, ambulate without difficulties and perform ADLs without difficulties. He denies weakness or numbness/tinglnig of the extremities. Denies bowel or bladder dysfunction.  PMH: Past Medical History:  Diagnosis Date  . Adenomatous colon polyp   . Anemia   . Anemia   . Asthma   . BPH (benign prostatic hyperplasia)    sees Dr. Risa Grill, biopsy June 2015 was benign   . Bronchiectasis ([place])   . CAD (coronary artery disease)    a. [place] s/p [place] x3 and ultimately BMS to pLAD (vision BMS 3.0 x 18), POBA to CTO of Cx-OM 03/17/14 and staged Rotablator-PCI w/ BMS (Mini Vision 2.5 x 28) to mRCA on 03/21/14  . Chronic bronchitis ([place])   . COPD (chronic obstructive pulmonary disease) ([place])   . Diverticulosis   . Femoral artery pseudo-aneurysm, right ([place])    a. s/p repair. Follow up with Dr. Kellie Simmering   . Fungal infection    lungs  . GERD (gastroesophageal reflux disease)   . Hiatal hernia   . History of blood transfusion 07/2016; 11/01/2016   "low blood; low blood"  . History of kidney stones   . HOH (hard of hearing)    bilaterally  . HTN (hypertension) 09/28/2013  . Moderate aortic stenosis by prior echocardiogram  07/08/2016   Echo 10/2015: Now Moderate AD (AVA ~1.2 cm) EF 60-65%. GR 1 DD. Echo 11/03/16:EF 60-65% with moderate LVH. Only noted grade 1 diastolic dysfunction. Moderate AS with moderate calcification and thickening. (AVA ~1.2-1.3 cm)  . Peptic stricture of esophagus   . Pneumonia    "1-2 times" (11/01/2016)  . Wegener's granulomatosis ([place])    sees Dr. [name] ; "went from my lungs to my kidneys" (11/01/2016)    PSH: Past Surgical History:  Procedure Laterality Date  . CARDIAC CATHETERIZATION  03/17/2014   Procedure: CORONARY BALLOON ANGIOPLASTY;  Surgeon: Troy Sine, MD;  Location: [facility] CATH LAB;  Service: Cardiovascular;;  . CATARACT EXTRACTION W/ INTRAOCULAR LENS  IMPLANT, BILATERAL  12/18/2012  . CHOLECYSTECTOMY N/A 12/21/2012   Procedure: LAPAROSCOPIC CHOLECYSTECTOMY WITH INTRAOPERATIVE CHOLANGIOGRAM;  Surgeon: Edward Jolly, MD;  Location: [facility] ORS;  Service: General;  Laterality: N/A;  . COLONOSCOPY  08-16-11   per Dr. Fuller Plan, diverticulosis and polyps, repeat in 5 yrs   . COLONOSCOPY N/A 08/01/2016   Procedure: COLONOSCOPY;  Surgeon: Carol Ada, MD;  Location: [facility] ENDOSCOPY;  Service: Endoscopy;  Laterality: N/A;  . ESOPHAGOGASTRODUODENOSCOPY N/A 07/31/2016   Procedure: ESOPHAGOGASTRODUODENOSCOPY (EGD);  Surgeon: Carol Ada, MD;  Location: [facility];  Service: Endoscopy;  Laterality: N/A;  . ESOPHAGOGASTRODUODENOSCOPY (EGD) WITH ESOPHAGEAL DILATION  11-29-10   per Dr. Fuller Plan   .  HEMATOMA EVACUATION Right 03/19/2014   Procedure: Suture repair of femoral artery with evacuation of hematoma;  Surgeon: Mal Misty, MD;  Location: [place];  Service: Vascular;  Laterality: Right;  . INGUINAL HERNIA REPAIR Right ~ 2002  . LEFT HEART CATHETERIZATION WITH CORONARY ANGIOGRAM N/A 03/14/2014   Procedure: LEFT HEART CATHETERIZATION WITH CORONARY ANGIOGRAM;  Surgeon: Leonie Man, MD;  Location: [facility] CATH LAB: pLAD 90% calcified lesion --> after D1, LAD is Subtotally occluded. mCx 100% (L-L  collaterals). pRI 60-80%. mRCA diffuse 80-95%.  Marland Kitchen LUNG BIOPSY  03/2014  . PERCUTANEOUS CORONARY STENT INTERVENTION (PCI-S) N/A 03/17/2014   Procedure: PERCUTANEOUS CORONARY STENT INTERVENTION (PCI-S);  Surgeon: Troy Sine, MD;  Location: [facility] CATH LAB: Vision BMS 3.0 mm x 18 mm -- 3.2 mm); PTCA of CTO Cx-OM  . PERCUTANEOUS CORONARY STENT INTERVENTION (PCI-S) N/A 03/21/2014   Procedure: PERCUTANEOUS CORONARY STENT INTERVENTION (PCI-S);  Surgeon: Burnell Blanks, MD;  Location: [facility] CATH LAB:  Rotational Atherectomy -- Mini [place] BMS 2.5 x 28 mm --> 2.75 mm)  . RENAL BIOPSY    . TRANSTHORACIC ECHOCARDIOGRAM  10/2015; 10/2016   a. Now Moderate AD (AVA ~1.2 cm) EF 60-65%. GR 1 DD.;; b. Stable -- EF 60-65% with moderate LVH. Only noted grade 1 diastolic dysfunction. Moderate aortic stenosis with moderate calcification and thickening. (Estimated valve area between 1.2-1.3 cm)    SH: Social History  Substance Use Topics  . Smoking status: Never Smoker  . Smokeless tobacco: Never Used  . Alcohol use No    MEDS: Prior to Admission medications   Medication Sig Start Date End Date Taking? Authorizing Provider  albuterol (VENTOLIN HFA) 108 (90 Base) MCG/ACT inhaler Inhale 1-2 puffs into the lungs every 4 (four) hours as needed for wheezing or shortness of breath. 07/12/16  Yes Tanda Rockers, MD  aspirin EC 81 MG tablet Take 81 mg by mouth daily.   Yes [provider]  b complex vitamins tablet Take 1 tablet by mouth daily.     Yes [provider]  bisacodyl (DULCOLAX) 5 MG EC tablet Take 5 mg by mouth 2 (two) times daily.    Yes [provider]  calcitRIOL (ROCALTROL) 0.25 MCG capsule Take 0.25 mcg by mouth daily.  09/06/15  Yes [provider]  cyclophosphamide (CYTOXAN) 25 MG capsule Take 25 mg by mouth daily. Take one capsule by mouth daily on an empty stomach 1 hour before or 2 hours after meal. 07/26/16  Yes [provider]   dextromethorphan-guaiFENesin (MUCINEX DM) 30-600 MG 12hr tablet Take 1 tablet by mouth 2 (two) times daily.   Yes [provider]  finasteride (PROSCAR) 5 MG tablet Take 1 tablet (5 mg total) by mouth daily. 11/18/16  Yes [name], MD  guaiFENesin-codeine 100-10 MG/5ML syrup Take 10 mLs by mouth every 6 (six) hours as needed for cough. 08/23/16  Yes Eber Jones, MD  ipratropium-albuterol (DUONEB) 0.5-2.5 (3) MG/3ML SOLN Take 3 mLs by nebulization every 4 (four) hours as needed (if inhaler is not effective in resolving shortness of breath or wheezing). Patient taking differently: Take 3 mLs by nebulization every 4 (four) hours as needed (WITH FLUTTER VALVE FOR WHEEZING, SHORTNESS OF BREATH-PLAN C).  02/03/16  Yes Tanda Rockers, MD  loratadine (CLARITIN) 10 MG tablet Take 10 mg by mouth daily.    Yes [provider]  Methylcellulose, Laxative, (CITRUCEL PO) Take 1 packet by mouth daily.   Yes [provider]  metoprolol succinate (TOPROL-XL) 25 MG 24 hr tablet Take 1 tablet (25 mg total) by mouth daily. 07/18/16  Yes Hilty, Nadean Corwin, MD  nitroGLYCERIN (NITROSTAT) 0.4 MG SL tablet Place 1 tablet (0.4 mg total) under the tongue every 5 (five) minutes x 3 doses as needed for chest pain. 03/24/15  Yes Hilty, Nadean Corwin, MD  ondansetron (ZOFRAN) 4 MG tablet Take 1 tablet (4 mg total) by mouth every 8 (eight) hours as needed for nausea or vomiting. 12/02/16  Yes Harris, Abigail, PA-C  pantoprazole (PROTONIX) 40 MG tablet Take 40 mg by mouth daily. 11/04/16  Yes [provider]  predniSONE (DELTASONE) 5 MG tablet Take 7.5 mg by mouth daily with breakfast.   Yes [provider]  ranitidine (ZANTAC) 300 MG tablet Take 1 tablet (300 mg total) by mouth 2 (two) times daily. 11/18/16  Yes [name], MD  simethicone (MYLICON) 062 MG chewable tablet Chew 250 mg by mouth 2 (two) times daily as needed for flatulence. TAKE PER BOX FOR GAS    Yes [provider]  simvastatin (ZOCOR) 20 MG tablet TAKE ONE TABLET BY MOUTH ONCE DAILY AT  6  PM 07/07/16  Yes Hilty, Nadean Corwin, MD  sodium bicarbonate 650 MG tablet Take 2 tablets (1,300 mg total) by mouth 2 (two) times daily. NEED OV. 08/12/15  Yes Hilty, Nadean Corwin, MD  sodium chloride (OCEAN) 0.65 % SOLN nasal spray Place 1 spray into both nostrils as needed for congestion.   Yes [provider]  SYMBICORT 160-4.5 MCG/ACT inhaler INHALE TWO PUFFS BY MOUTH TWICE DAILY 06/01/16  Yes Tanda Rockers, MD  tamsulosin (FLOMAX) 0.4 MG CAPS capsule TAKE ONE CAPSULE BY MOUTH ONCE DAILY IN THE EVENING 07/05/16  Yes [name], MD  oxyCODONE-acetaminophen (PERCOCET) 10-325 MG tablet Take 1 tablet by mouth every 6 (six) hours as needed for pain. Patient not taking: Reported on 01/05/2017 12/22/16   [name], MD    ALLERGY: No Known Allergies  ROS: Review of Systems  Constitutional: Positive for malaise/fatigue.  HENT: Positive for congestion.   Respiratory: Positive for cough and sputum production.   Gastrointestinal: Negative.   Genitourinary: Negative.   Neurological: Negative for dizziness, tingling, tremors, sensory change, speech change, focal weakness, seizures, loss of consciousness and headaches.    Vitals:   01/06/17 1041 01/06/17 1210  BP: 109/69 123/83  Pulse: (!) 106 90  Resp: (!) 24 (!) 21  Temp: 98.5 F (36.9 C) 98.2 F (36.8 C)   General appearance: WDWN, NAD, resting comfortable Eyes: PERRL Cardiovascular: Regular rate and rhythm without murmurs, rubs, gallops. No edema or variciosities. Distal pulses normal. Pulmonary: Clear to auscultation Musculoskeletal:     Muscle tone upper extremities: Normal    Muscle tone lower extremities: Normal    Motor exam: Upper Extremities Deltoid Bicep Tricep Grip  Right 5/5 5/5 5/5 5/5  Left 5/5 5/5 5/5 5/5   Lower Extremity IP Quad PF DF EHL  Right 5/5 5/5 5/5 5/5 5/5  Left 5/5 5/5 5/5 5/5 5/5    Neurological Awake, alert, oriented Memory and concentration grossly intact Speech fluent, appropriate CNII: Visual fields normal CNIII/IV/VI: EOMI CNV: Facial sensation normal CNVII: Symmetric, normal strength CNVIII: Grossly normal CNIX: Normal palate movement CNXI: Trap and SCM strength normal CN XII: Tongue protrusion normal Sensation grossly intact to LT  IMAGING: MRI T Spine IMPRESSION: Findings consistent with an acute or subacute compression fracture of T7 with vertebral plana deformity. Soft tissue  edema between the T6-7 and T7-8 spinous processes is consistent with ligamentous injury. Retropulsed bone off the inferior endplate of T7 effaces the ventral thecal sac but the central canal and foramina are open.  IMPRESSION/PLAN - 81 y.o. male with T7 compression fracture. There is retropulsion that effaces the ventral sac, but central canal and foramina are open. He is neurologically intact and without deficits. He denies any neurological symptoms. His pain appears to have improved significantly since onset. I have reviewed the case with Dr Cyndy Freeze who has also reviewed the imaging. No need for acute management at this time. Place in [place] Hyperextension brace, F/U outpatient in 4 weeks for repeat Xrays and monitoring. Patient was advised of red flag symptoms for when to seek urgent medical attention in regards to his compression fracture. Please call with any concerns.

## 2017-01-06 NOTE — Progress Notes (Signed)
Patient ID: Richard [name], male   DOB: 1935-12-01, 81 y.o.   MRN: [id number]   Request has been made for IR to evaluate and consider Thoracic 7 KP for this pt.  MRI: IMPRESSION: Findings consistent with an acute or subacute compression fracture of T7 with vertebral plana deformity. Soft tissue edema between the T6-7 and T7-8 spinous processes is consistent with ligamentous injury. Retropulsed bone off the inferior endplate of T7 effaces the ventral thecal sac but the central canal and foramina are open  Dr Estanislado Pandy has reviewed imaging NOT a candidate for vertebroplasty/kyphoplasty Secondary retropulsion in contact with thoracic spinal cord. Unsafe to proceed with either procedure.

## 2017-01-06 NOTE — Progress Notes (Signed)
PROGRESS NOTE    Richard [name]  TML:[id number] DOB: 06/18/1936 DOA: 01/05/2017 PCP: [name], MD   Brief Narrative: Richard [name] is a 81 y.o. male with medical history significant of BPH, CAD, COPD, diverticulosis, anemia, GERD, hiatal hernia, Wegener's granulomatosis, PUD, hypertension, kidney stones. He presents with pneumonia and T7 vertebral compression fracture. He met sepsis criteria on admission and is being treated with broad spectrum antibiotics. Neurosurgery consulted for compression fracture.   Assessment & Plan:   Active Problems:   Wegener's granulomatosis ([place])   Sepsis ([place])   Lobar pneumonia ([place])   Compression fracture of thoracic spine, non-traumatic ([facility])   AKI (acute kidney injury) ([place])   Hypotension   Sepsis Secondary to pneumonia. Physiology improved with IV fluids and antibiotics.  Healthcare associated Pneumonia Improved symptoms -continue Vancomycin/cefepime -blood cultures pending -continue Mucinex -continue Tessalon perles -continue Duoneb/Symbicort  Thoracic spine compression fracture Severe fracture of T7. Too high risk for IR. -consult neurosurgery  Acute kidney injury on chronic CKD stage IV Improving with IV fluids -repeat BMP  Chronic anemia Stable on repeat H&H. No evidence of bleeding. -repeat CBC in AM  GERD -continue Pepcid and protonix  CAD S/p cath and stent placement. Plavix discontinued last admission secondary to GI bleed. No chest pain except with cough -continue statin and Aspirin  BPH -continue Proscar and Flomax  Hyperlipidemia -continue statin  Wegener's -continue prednisone and Cytoxan    DVT prophylaxis: Heparin Code Status: Full code Family Communication: wife, daughter, son, granddaughter and great granddaughter at bedside Disposition Plan: Discharge in 48-72 hours pending workup of fracture and treatment of pneumonia   Consultants:   Neurosurgery  Interventional  radiology  Procedures:   None  Antimicrobials:  Vancomycin  Cefepime    Subjective: Patient reports some chest pain with coughing. Cough has some production but that has improved.   Objective: Vitals:   01/06/17 0611 01/06/17 0922 01/06/17 1041 01/06/17 1210  BP: 124/60  109/69 123/83  Pulse: 93  (!) 106 90  Resp: (!) 21  (!) 24 (!) 21  Temp: 98.1 F (36.7 C)  98.5 F (36.9 C) 98.2 F (36.8 C)  TempSrc:   Oral Oral  SpO2: 92% 97% 95% 97%  Weight: 47.3 kg (104 lb 3.2 oz)     Height:        Intake/Output Summary (Last 24 hours) at 01/06/17 1233 Last data filed at 01/06/17 0900  Gross per 24 hour  Intake          2281.25 ml  Output              600 ml  Net          1681.25 ml   Filed Weights   01/05/17 1321 01/05/17 1658 01/06/17 0611  Weight: 52.2 kg (115 lb) 50.3 kg (110 lb 14.3 oz) 47.3 kg (104 lb 3.2 oz)    Examination:  General exam: Appears calm and comfortable Respiratory system: Clear to auscultation. Respiratory effort normal. Cardiovascular system: S1 & S2 heard, RRR. 2/6 systolic crescendo-decrescendo murmur. Gastrointestinal system: Abdomen is nondistended, soft and nontender. Normal bowel sounds heard. Central nervous system: Alert and oriented. No focal neurological deficits. Extremities: No edema. No calf tenderness Skin: No cyanosis. No rashes Psychiatry: Judgement and insight appear normal. Mood & affect appropriate.     Data Reviewed: I have personally reviewed following labs and imaging studies  CBC:  Recent Labs Lab 01/05/17 1307 01/06/17 0519 01/06/17 0951  WBC 11.1* 4.6  --  NEUTROABS 9.0*  --   --   HGB 8.9* 7.7* 8.4*  HCT 27.7* 24.3* 26.3*  MCV 104.1* 104.7*  --   PLT 271 172  --    Basic Metabolic Panel:  Recent Labs Lab 01/05/17 1307 01/06/17 0519  NA 141 144  K 4.1 4.0  CL 105 114*  CO2 26 23  GLUCOSE 101* 84  BUN 20 20  CREATININE 2.34* 2.01*  CALCIUM 9.2 8.1*   GFR: Estimated Creatinine Clearance: 20.3  mL/min (A) (by C-G formula based on SCr of 2.01 mg/dL (H)). Liver Function Tests:  Recent Labs Lab 01/05/17 1307  AST 23  ALT 16*  ALKPHOS 98  BILITOT 0.7  PROT 6.1*  ALBUMIN 3.1*   No results for input(s): LIPASE, AMYLASE in the last 168 hours. No results for input(s): AMMONIA in the last 168 hours. Coagulation Profile:  Recent Labs Lab 01/05/17 1307  INR 1.09   Cardiac Enzymes:  Recent Labs Lab 01/05/17 1811  TROPONINI <0.03   BNP (last 3 results) No results for input(s): PROBNP in the last 8760 hours. HbA1C: No results for input(s): HGBA1C in the last 72 hours. CBG: No results for input(s): GLUCAP in the last 168 hours. Lipid Profile: No results for input(s): CHOL, HDL, LDLCALC, TRIG, CHOLHDL, LDLDIRECT in the last 72 hours. Thyroid Function Tests: No results for input(s): TSH, T4TOTAL, FREET4, T3FREE, THYROIDAB in the last 72 hours. Anemia Panel:  Recent Labs  01/05/17 1703  FERRITIN 691*  TIBC 140*  IRON 17*   Sepsis Labs:  Recent Labs Lab 01/05/17 1343 01/05/17 1522 01/05/17 1811 01/05/17 2109  PROCALCITON  --  <0.10 <0.10  --   LATICACIDVEN 2.52*  --  1.0 1.3    Recent Results (from the past 240 hour(s))  Culture, blood (Routine x 2)     Status: None (Preliminary result)   Collection Time: 01/05/17  1:11 PM  Result Value Ref Range Status   Specimen Description BLOOD LEFT ANTECUBITAL  Final   Special Requests   Final    BOTTLES DRAWN AEROBIC AND ANAEROBIC Blood Culture results may not be optimal due to an excessive volume of blood received in culture bottles   Culture NO GROWTH < 24 HOURS  Final   Report Status PENDING  Incomplete  Culture, blood (Routine x 2)     Status: None (Preliminary result)   Collection Time: 01/05/17  5:06 PM  Result Value Ref Range Status   Specimen Description BLOOD RIGHT ANTECUBITAL  Final   Special Requests IN PEDIATRIC BOTTLE Blood Culture adequate volume  Final   Culture NO GROWTH < 24 HOURS  Final    Report Status PENDING  Incomplete  MRSA PCR Screening     Status: None   Collection Time: 01/05/17  5:46 PM  Result Value Ref Range Status   MRSA by PCR NEGATIVE NEGATIVE Final    Comment:        The GeneXpert MRSA Assay (FDA approved for NASAL specimens only), is one component of a comprehensive MRSA colonization surveillance program. It is not intended to diagnose MRSA infection nor to guide or monitor treatment for MRSA infections.          Radiology Studies: Dg Chest 2 View  Result Date: 01/05/2017 CLINICAL DATA:  Weakness and fatigue and cough for the past 10 days. History of COPD, bronchiectasis, previous episodes of pneumonia, Wegner's granulomatosis. EXAM: CHEST  2 VIEW COMPARISON:  Chest x-ray of April 27th 2018 FINDINGS: The lungs remain hyperinflated. The  interstitial markings are increased bilaterally and on the left are more conspicuous in the mid lung field. There is no alveolar infiltrate. The heart and pulmonary vascularity are normal. There is calcification in the wall of the aortic arch. There is a hiatal hernia. The bony thorax exhibits no acute abnormality. IMPRESSION: Chronic bronchitic change and it interstitial change. Superimposed new interstitial infiltrate in the left mid lung which may reflect mild interstitial pneumonia or active inflammatory disease. Electronically Signed   By: David  Martinique M.D.   On: 01/05/2017 14:08        Scheduled Meds: . acetaminophen  1,000 mg Oral Q8H   Or  . acetaminophen  650 mg Rectal Q8H  . aspirin EC  81 mg Oral Daily  . cyclophosphamide  25 mg Oral Daily  . famotidine  20 mg Oral Q24H  . finasteride  5 mg Oral Daily  . guaiFENesin  1,200 mg Oral BID  . heparin  5,000 Units Subcutaneous Q8H  . loratadine  10 mg Oral Daily  . mometasone-formoterol  2 puff Inhalation BID  . pantoprazole  40 mg Oral Daily  . predniSONE  7.5 mg Oral Q breakfast  . simvastatin  20 mg Oral q1800  . sodium bicarbonate  1,300 mg Oral  BID  . tamsulosin  0.4 mg Oral QPC breakfast   Continuous Infusions: . sodium chloride 75 mL/hr at 01/05/17 1843  . ceFEPime (MAXIPIME) IV    . methocarbamol (ROBAXIN)  IV    . [START ON 01/07/2017] vancomycin       LOS: 1 day     Cordelia Poche, MD [facility] 01/06/2017, 12:33 PM Pager: [phone]  If 7PM-7AM, please contact night-coverage [email or web address] Password [facility] 01/06/2017, 12:33 PM

## 2017-01-07 ENCOUNTER — Inpatient Hospital Stay (HOSPITAL_COMMUNITY): Payer: Medicare Other

## 2017-01-07 LAB — BASIC METABOLIC PANEL
ANION GAP: 9 (ref 5–15)
BUN: 20 mg/dL (ref 6–20)
CALCIUM: 7.7 mg/dL — AB (ref 8.9–10.3)
CHLORIDE: 112 mmol/L — AB (ref 101–111)
CO2: 20 mmol/L — AB (ref 22–32)
CREATININE: 1.78 mg/dL — AB (ref 0.61–1.24)
GFR calc non Af Amer: 35 mL/min — ABNORMAL LOW (ref >60)
GFR, EST AFRICAN AMERICAN: 40 mL/min — AB (ref >60)
Glucose, Bld: 77 mg/dL (ref 65–99)
Potassium: 3.3 mmol/L — ABNORMAL LOW (ref 3.5–5.1)
SODIUM: 141 mmol/L (ref 135–145)

## 2017-01-07 LAB — CBC
HCT: 23.8 % — ABNORMAL LOW (ref 39.0–52.0)
HEMOGLOBIN: 7.6 g/dL — AB (ref 13.0–17.0)
MCH: 33 pg (ref 26.0–34.0)
MCHC: 31.9 g/dL (ref 30.0–36.0)
MCV: 103.5 fL — AB (ref 78.0–100.0)
PLATELETS: 166 10*3/uL (ref 150–400)
RBC: 2.3 MIL/uL — AB (ref 4.22–5.81)
RDW: 16.7 % — ABNORMAL HIGH (ref 11.5–15.5)
WBC: 5.3 10*3/uL (ref 4.0–10.5)

## 2017-01-07 LAB — HEMOGLOBIN AND HEMATOCRIT, BLOOD
HCT: 24.7 % — ABNORMAL LOW (ref 39.0–52.0)
HCT: 27.1 % — ABNORMAL LOW (ref 39.0–52.0)
HEMOGLOBIN: 7.9 g/dL — AB (ref 13.0–17.0)
Hemoglobin: 8.8 g/dL — ABNORMAL LOW (ref 13.0–17.0)

## 2017-01-07 LAB — PREPARE RBC (CROSSMATCH)

## 2017-01-07 LAB — LEGIONELLA PNEUMOPHILA SEROGP 1 UR AG: L. PNEUMOPHILA SEROGP 1 UR AG: NEGATIVE

## 2017-01-07 MED ORDER — POLYETHYLENE GLYCOL 3350 17 G PO PACK
17.0000 g | PACK | Freq: Every day | ORAL | Status: DC
  Administered 2017-01-07: 17 g via ORAL
  Filled 2017-01-07: qty 1

## 2017-01-07 MED ORDER — POTASSIUM CHLORIDE CRYS ER 20 MEQ PO TBCR
40.0000 meq | EXTENDED_RELEASE_TABLET | Freq: Once | ORAL | Status: AC
  Administered 2017-01-07: 40 meq via ORAL
  Filled 2017-01-07: qty 2

## 2017-01-07 MED ORDER — SODIUM CHLORIDE 0.9 % IV SOLN: Freq: Once | INTRAVENOUS | Status: DC

## 2017-01-07 MED ORDER — VANCOMYCIN HCL 500 MG IV SOLR
500.0000 mg | INTRAVENOUS | Status: DC
  Administered 2017-01-07: 500 mg via INTRAVENOUS
  Filled 2017-01-07: qty 500

## 2017-01-07 NOTE — Progress Notes (Signed)
Pharmacy Antibiotic Note  Richard [name] is a 81 y.o. male admitted on 01/05/2017 with pneumonia.  Today is day 3 of cefepime and vancomycin for pneumonia. WBC now wnl, cr to 1.78 (BL 1.6-1.8), and 24 hour max 99.1. Cultures are negative so far.   Plan: Vancomycin 500mg  q48 hours Cefepime 1g IV Q24H Monitor renal function, cultures, vanc trough at steady state, ability to de-escalate  Height: 5\' 7"  (170.2 cm) Weight: 104 lb (47.2 kg) IBW/kg (Calculated) : 66.1  Temp (24hrs), Avg:98.6 F (37 C), Min:98.1 F (36.7 C), Max:99.1 F (37.3 C)   Recent Labs Lab 01/05/17 1307 01/05/17 1343 01/05/17 1811 01/05/17 2109 01/06/17 0519 01/07/17 0550  WBC 11.1*  --   --   --  4.6 5.3  CREATININE 2.34*  --   --   --  2.01* 1.78*  LATICACIDVEN  --  2.52* 1.0 1.3  --   --     Estimated Creatinine Clearance: 22.8 mL/min (A) (by C-G formula based on SCr of 1.78 mg/dL (H)).    No Known Allergies  Antimicrobials this admission: 5/31 vanc >>  5/31 cefepime >>   Dose adjustments this admission: n/a  Microbiology results: 5/31 BCx: NGTD 5/31 UCx: Neg 5/31 MRSA PCR: Neg  Thank you for allowing pharmacy to be a part of this patient's care.  Dierdre Harness, [place], PharmD Clinical Pharmacy Resident [phone] (Pager) 01/07/2017 10:37 AM

## 2017-01-07 NOTE — Progress Notes (Signed)
Pt was given flutter valve but was eating at this time.

## 2017-01-07 NOTE — Progress Notes (Signed)
PROGRESS NOTE    Richard [name]  TTS:[id number] DOB: May 10, 1936 DOA: 01/05/2017 PCP: [name], MD   Brief Narrative: [name] is a 81 y.o. male with medical history significant of BPH, CAD, COPD, diverticulosis, anemia, GERD, hiatal hernia, Wegener's granulomatosis, PUD, hypertension, kidney stones. He presents with pneumonia and T7 vertebral compression fracture. He met sepsis criteria on admission and is being treated with broad spectrum antibiotics. Neurosurgery consulted for compression fracture.   Assessment & Plan:   Active Problems:   Wegener's granulomatosis ([place])   Sepsis ([place])   Lobar pneumonia ([place])   Compression fracture of thoracic spine, non-traumatic (HCC)   AKI (acute kidney injury) ([place])   Hypotension   Sepsis Secondary to pneumonia. Physiology improved with IV fluids and antibiotics.  Healthcare associated Pneumonia Not coughing quite as much. Afebrile. Cultures no growth to date. -continue Vancomycin/cefepime -blood cultures pending -continue Mucinex -continue Tessalon perles -continue Duoneb/Symbicort -may need to consider a prednisone burst  Thoracic spine compression fracture Severe fracture of T7. Too high risk for IR. -neurosurgery: outpatient follow-up  Acute kidney injury on chronic CKD stage IV Improving with IV fluids -repeat BMP  Chronic anemia Continued drop overnight -1 unit of PRBC -repeat CBC in AM  GERD -continue Pepcid and protonix  CAD S/p cath and stent placement. Plavix discontinued last admission secondary to GI bleed. No chest pain except with cough -continue statin and Aspirin  BPH -continue Proscar and Flomax  Hyperlipidemia -continue statin  Wegener's -continue prednisone and Cytoxan    DVT prophylaxis: Heparin Code Status: Full code Family Communication: wife, daughter, son, granddaughter and great granddaughter at bedside Disposition Plan: Discharge in 24-48 hours pending workup of  fracture and treatment of pneumonia and PT eval   Consultants:   Neurosurgery  Interventional radiology  Procedures:   None  Antimicrobials:  Vancomycin  Cefepime    Subjective: No chest pain but he has worsened dyspnea today.   Objective: Vitals:   01/06/17 2032 01/07/17 0020 01/07/17 0523 01/07/17 0734  BP: 112/69 135/75 124/64 131/72  Pulse: 99 90 100 95  Resp: (!) 22 19 (!) 24 (!) 21  Temp: 98.1 F (36.7 C) 99.1 F (37.3 C) 99 F (37.2 C) 99 F (37.2 C)  TempSrc:    Oral  SpO2: 97% 96% 98% 93%  Weight:   47.2 kg (104 lb)   Height:        Intake/Output Summary (Last 24 hours) at 01/07/17 1501 Last data filed at 01/07/17 0846  Gross per 24 hour  Intake             2165 ml  Output              625 ml  Net             1540 ml   Filed Weights   01/05/17 1658 01/06/17 0611 01/07/17 0523  Weight: 50.3 kg (110 lb 14.3 oz) 47.3 kg (104 lb 3.2 oz) 47.2 kg (104 lb)    Examination:  General exam: Appears calm and comfortable Respiratory system: Wheezing bilaterally with diminished breath sounds. Respiratory effort normal. Cardiovascular system: S1 & S2 heard, RRR. 3/6 systolic crescendo-decrescendo murmur. Gastrointestinal system: Abdomen is nondistended, soft and nontender. Normal bowel sounds heard. Central nervous system: Alert and oriented. No focal neurological deficits. Extremities: No edema. No calf tenderness Skin: No cyanosis. No rashes Psychiatry: Judgement and insight appear normal. Mood & affect appropriate.     Data Reviewed: I have personally reviewed following  labs and imaging studies  CBC:  Recent Labs Lab 01/05/17 1307 01/06/17 0519 01/06/17 0951 01/07/17 0550 01/07/17 0847  WBC 11.1* 4.6  --  5.3  --   NEUTROABS 9.0*  --   --   --   --   HGB 8.9* 7.7* 8.4* 7.6* 7.9*  HCT 27.7* 24.3* 26.3* 23.8* 24.7*  MCV 104.1* 104.7*  --  103.5*  --   PLT 271 172  --  166  --    Basic Metabolic Panel:  Recent Labs Lab 01/05/17 1307  01/06/17 0519 01/07/17 0550  NA 141 144 141  K 4.1 4.0 3.3*  CL 105 114* 112*  CO2 26 23 20*  GLUCOSE 101* 84 77  BUN '20 20 20  ' CREATININE 2.34* 2.01* 1.78*  CALCIUM 9.2 8.1* 7.7*   GFR: Estimated Creatinine Clearance: 22.8 mL/min (A) (by C-G formula based on SCr of 1.78 mg/dL (H)). Liver Function Tests:  Recent Labs Lab 01/05/17 1307  AST 23  ALT 16*  ALKPHOS 98  BILITOT 0.7  PROT 6.1*  ALBUMIN 3.1*   No results for input(s): LIPASE, AMYLASE in the last 168 hours. No results for input(s): AMMONIA in the last 168 hours. Coagulation Profile:  Recent Labs Lab 01/05/17 1307  INR 1.09   Cardiac Enzymes:  Recent Labs Lab 01/05/17 1811  TROPONINI <0.03   BNP (last 3 results) No results for input(s): PROBNP in the last 8760 hours. HbA1C: No results for input(s): HGBA1C in the last 72 hours. CBG: No results for input(s): GLUCAP in the last 168 hours. Lipid Profile: No results for input(s): CHOL, HDL, LDLCALC, TRIG, CHOLHDL, LDLDIRECT in the last 72 hours. Thyroid Function Tests: No results for input(s): TSH, T4TOTAL, FREET4, T3FREE, THYROIDAB in the last 72 hours. Anemia Panel:  Recent Labs  01/05/17 1703  FERRITIN 691*  TIBC 140*  IRON 17*   Sepsis Labs:  Recent Labs Lab 01/05/17 1343 01/05/17 1522 01/05/17 1811 01/05/17 2109  PROCALCITON  --  <0.10 <0.10  --   LATICACIDVEN 2.52*  --  1.0 1.3    Recent Results (from the past 240 hour(s))  Culture, blood (Routine x 2)     Status: None (Preliminary result)   Collection Time: 01/05/17  1:11 PM  Result Value Ref Range Status   Specimen Description BLOOD LEFT ANTECUBITAL  Final   Special Requests   Final    BOTTLES DRAWN AEROBIC AND ANAEROBIC Blood Culture results may not be optimal due to an excessive volume of blood received in culture bottles   Culture NO GROWTH < 24 HOURS  Final   Report Status PENDING  Incomplete  Urine culture     Status: None   Collection Time: 01/05/17  3:33 PM  Result  Value Ref Range Status   Specimen Description URINE, CLEAN CATCH  Final   Special Requests NONE  Final   Culture NO GROWTH  Final   Report Status 01/06/2017 FINAL  Final  Culture, blood (Routine x 2)     Status: None (Preliminary result)   Collection Time: 01/05/17  5:06 PM  Result Value Ref Range Status   Specimen Description BLOOD RIGHT ANTECUBITAL  Final   Special Requests IN PEDIATRIC BOTTLE Blood Culture adequate volume  Final   Culture NO GROWTH < 24 HOURS  Final   Report Status PENDING  Incomplete  MRSA PCR Screening     Status: None   Collection Time: 01/05/17  5:46 PM  Result Value Ref Range Status   MRSA  by PCR NEGATIVE NEGATIVE Final    Comment:        The GeneXpert MRSA Assay (FDA approved for NASAL specimens only), is one component of a comprehensive MRSA colonization surveillance program. It is not intended to diagnose MRSA infection nor to guide or monitor treatment for MRSA infections.          Radiology Studies: Dg Chest Port 1 View  Result Date: 01/07/2017 CLINICAL DATA:  Shortness of breath with exertion. EXAM: PORTABLE CHEST 1 VIEW COMPARISON:  01/05/2017 and multiple previous FINDINGS: Heart size is normal. Chronic aortic atherosclerosis. Chronic lung disease with abnormal interstitial markings bilaterally. More focal density in the right suprahilar region and upper lobe suggesting bronchopneumonia. Follow-up to clearing recommended. IMPRESSION: Chronic lung disease as seen previously. Focal density in the right suprahilar region and right upper lobe consistent with pneumonia. Follow-up to clearing. Electronically Signed   By: Nelson Chimes M.D.   On: 01/07/2017 09:48        Scheduled Meds: . acetaminophen  1,000 mg Oral Q8H   Or  . acetaminophen  650 mg Rectal Q8H  . aspirin EC  81 mg Oral Daily  . bisacodyl  5 mg Oral BID  . cyclophosphamide  25 mg Oral Daily  . famotidine  20 mg Oral Q24H  . finasteride  5 mg Oral Daily  . guaiFENesin  1,200  mg Oral BID  . heparin  5,000 Units Subcutaneous Q8H  . loratadine  10 mg Oral Daily  . mometasone-formoterol  2 puff Inhalation BID  . pantoprazole  40 mg Oral Daily  . polyethylene glycol  17 g Oral Daily  . potassium chloride  40 mEq Oral Once  . predniSONE  7.5 mg Oral Q breakfast  . simvastatin  20 mg Oral q1800  . sodium bicarbonate  1,300 mg Oral BID  . tamsulosin  0.4 mg Oral QPC breakfast   Continuous Infusions: . sodium chloride 75 mL/hr at 01/07/17 0600  . sodium chloride    . ceFEPime (MAXIPIME) IV Stopped (01/07/17 1413)  . methocarbamol (ROBAXIN)  IV    . vancomycin       LOS: 2 days     Cordelia Poche, MD [facility] 01/07/2017, 3:01 PM Pager: [phone]  If 7PM-7AM, please contact night-coverage [email or web address] Password [email or web address] 01/07/2017, 3:01 PM

## 2017-01-07 NOTE — Evaluation (Signed)
Physical Therapy Evaluation Patient Details Name: Richard [name] MRN: [id number] DOB: April 27, 1936 Today's Date: 01/07/2017   History of Present Illness  Patient is a 81 yo male admitted 01/05/17 with hypotension, sepsis/pna, anemia, T7 compression fracture, AKI.     PMH:  CAD, COPD, anemia, HTN, CKD  Clinical Impression  Patient presents with problems listed below.  Will benefit from acute PT to maximize functional independence prior to return home with wife.  Recommend HHPT at d/c for continued therapy.    Follow Up Recommendations Home health PT;Supervision for mobility/OOB    Equipment Recommendations  3in1 (PT)    Recommendations for Other Services OT consult     Precautions / Restrictions Precautions Precautions: Fall Restrictions Weight Bearing Restrictions: No      Mobility  Bed Mobility Overal bed mobility: Needs Assistance Bed Mobility: Rolling Rolling: Min assist         General bed mobility comments: Assist mainly for IV in RUE for blood (kept occluding).    Transfers                 General transfer comment: NT  Ambulation/Gait             General Gait Details: Per report, patient has been up to bathroom with IV pole support.  Stairs            Wheelchair Mobility    Modified Rankin (Stroke Patients Only)       Balance                                             Pertinent Vitals/Pain Pain Assessment: 0-10 Pain Score: 5  Pain Location: back Pain Descriptors / Indicators: Aching;Sore Pain Intervention(s): Limited activity within patient's tolerance;Monitored during session;Repositioned    Home Living Family/patient expects to be discharged to:: Private residence Living Arrangements: Spouse/significant other Available Help at Discharge: Family;Available 24 hours/day Type of Home: House Home Access: Stairs to enter Entrance Stairs-Rails: None Entrance Stairs-Number of Steps: 2 Home Layout: One  level Home Equipment: Walker - 2 wheels;Cane - single point;Tub bench      Prior Function Level of Independence: Independent               Hand Dominance        Extremity/Trunk Assessment   Upper Extremity Assessment Upper Extremity Assessment: Generalized weakness    Lower Extremity Assessment Lower Extremity Assessment: Generalized weakness       Communication   Communication: HOH  Cognition Arousal/Alertness: Awake/alert Behavior During Therapy: WFL for tasks assessed/performed;Flat affect Overall Cognitive Status: Within Functional Limits for tasks assessed                                        General Comments      Exercises     Assessment/Plan    PT Assessment Patient needs continued PT services  PT Problem List Decreased strength;Decreased activity tolerance;Decreased balance;Decreased mobility;Decreased knowledge of use of DME;Decreased knowledge of precautions;Cardiopulmonary status limiting activity;Pain       PT Treatment Interventions DME instruction;Gait training;Stair training;Functional mobility training;Therapeutic activities;Balance training;Patient/family education    PT Goals (Current goals can be found in the Care Plan section)  Acute Rehab PT Goals Patient Stated Goal: None stated today.  Focused on IV with blood  running. PT Goal Formulation: With patient/family Time For Goal Achievement: 01/14/17 Potential to Achieve Goals: Good    Frequency Min 3X/week   Barriers to discharge        Co-evaluation               AM-PAC PT "6 Clicks" Daily Activity  Outcome Measure Difficulty turning over in bed (including adjusting bedclothes, sheets and blankets)?: A Little Difficulty moving from lying on back to sitting on the side of the bed? : A Lot Difficulty sitting down on and standing up from a chair with arms (e.g., wheelchair, bedside commode, etc,.)?: A Little Help needed moving to and from a bed to chair  (including a wheelchair)?: A Little Help needed walking in hospital room?: A Little Help needed climbing 3-5 steps with a railing? : A Lot 6 Click Score: 16    End of Session   Activity Tolerance: Patient limited by fatigue (Limited by IV in RUE - fearful of losing it) Patient left: in bed;with call bell/phone within reach;with family/visitor present Nurse Communication: Other (comment) (IV issues) PT Visit Diagnosis: Muscle weakness (generalized) (M62.81);Difficulty in walking, not elsewhere classified (R26.2);Pain Pain - part of body:  (back)    Time: 0102-7253 PT Time Calculation (min) (ACUTE ONLY): 11 min   Charges:   PT Evaluation $PT Eval Moderate Complexity: 1 Procedure     PT G Codes:        Carita Pian. Sanjuana Kava, [facility] Acute Rehab Services Pager [phone]   Despina Pole 01/07/2017, 5:50 PM

## 2017-01-08 DIAGNOSIS — M4854XD Collapsed vertebra, not elsewhere classified, thoracic region, subsequent encounter for fracture with routine healing: Secondary | ICD-10-CM

## 2017-01-08 DIAGNOSIS — M3131 Wegener's granulomatosis with renal involvement: Secondary | ICD-10-CM

## 2017-01-08 DIAGNOSIS — Z800 Family history of malignant neoplasm of digestive organs: Secondary | ICD-10-CM

## 2017-01-08 DIAGNOSIS — J479 Bronchiectasis, uncomplicated: Secondary | ICD-10-CM

## 2017-01-08 DIAGNOSIS — Z8249 Family history of ischemic heart disease and other diseases of the circulatory system: Secondary | ICD-10-CM

## 2017-01-08 DIAGNOSIS — R000 Tachycardia, unspecified: Secondary | ICD-10-CM

## 2017-01-08 DIAGNOSIS — Z825 Family history of asthma and other chronic lower respiratory diseases: Secondary | ICD-10-CM

## 2017-01-08 LAB — CBC
HEMATOCRIT: 27.7 % — AB (ref 39.0–52.0)
Hemoglobin: 9 g/dL — ABNORMAL LOW (ref 13.0–17.0)
MCH: 32.3 pg (ref 26.0–34.0)
MCHC: 32.5 g/dL (ref 30.0–36.0)
MCV: 99.3 fL (ref 78.0–100.0)
PLATELETS: 146 10*3/uL — AB (ref 150–400)
RBC: 2.79 MIL/uL — AB (ref 4.22–5.81)
RDW: 18.3 % — AB (ref 11.5–15.5)
WBC: 6.5 10*3/uL (ref 4.0–10.5)

## 2017-01-08 LAB — BASIC METABOLIC PANEL
ANION GAP: 7 (ref 5–15)
BUN: 23 mg/dL — AB (ref 6–20)
CHLORIDE: 114 mmol/L — AB (ref 101–111)
CO2: 22 mmol/L (ref 22–32)
Calcium: 7.7 mg/dL — ABNORMAL LOW (ref 8.9–10.3)
Creatinine, Ser: 1.5 mg/dL — ABNORMAL HIGH (ref 0.61–1.24)
GFR, EST AFRICAN AMERICAN: 50 mL/min — AB (ref >60)
GFR, EST NON AFRICAN AMERICAN: 43 mL/min — AB (ref >60)
Glucose, Bld: 76 mg/dL (ref 65–99)
POTASSIUM: 4.5 mmol/L (ref 3.5–5.1)
SODIUM: 143 mmol/L (ref 135–145)

## 2017-01-08 LAB — TYPE AND SCREEN
ABO/RH(D): O POS
Antibody Screen: NEGATIVE
Unit division: 0

## 2017-01-08 LAB — BPAM RBC
Blood Product Expiration Date: 201806262359
ISSUE DATE / TIME: 201806021626
Unit Type and Rh: 5100

## 2017-01-08 MED ORDER — CEFUROXIME AXETIL 500 MG PO TABS
500.0000 mg | ORAL_TABLET | Freq: Every day | ORAL | Status: DC
  Administered 2017-01-08 – 2017-01-09 (×2): 500 mg via ORAL
  Filled 2017-01-08 (×2): qty 1

## 2017-01-08 MED ORDER — LEVOFLOXACIN 750 MG PO TABS
750.0000 mg | ORAL_TABLET | ORAL | Status: DC
  Administered 2017-01-08: 750 mg via ORAL
  Filled 2017-01-08: qty 1

## 2017-01-08 NOTE — Progress Notes (Signed)
SATURATION QUALIFICATIONS: (This note is used to comply with regulatory documentation for home oxygen)  Patient Saturations on Room Air at Rest = 97%  Patient Saturations on Room Air while Ambulating = 89%  Patient Saturations on N/A Liters of oxygen while Ambulating = N/A%  Please briefly explain why patient needs home oxygen: O2 sats remained in 90's with decrease to 89% x1 during gait on room air.    Richard [name], Luis M. Cintron Pager [phone]

## 2017-01-08 NOTE — Progress Notes (Signed)
PROGRESS NOTE    Richard [name]  QIW:[id number] DOB: May 20, 1936 DOA: 01/05/2017 PCP: Richard Morale, MD   Brief Narrative: Richard [name] is a 81 y.o. male with medical history significant of BPH, CAD, COPD, diverticulosis, anemia, GERD, hiatal hernia, Wegener's granulomatosis, PUD, hypertension, kidney stones. He presents with pneumonia and T7 vertebral compression fracture. He met sepsis criteria on admission and is being treated with broad spectrum antibiotics. Neurosurgery consulted for compression fracture. Improved with antibiotics and narrowed to oral regimen. No evidence of bacteremia.   Assessment & Plan:   Active Problems:   Wegener's granulomatosis ([place])   Sepsis ([place])   Lobar pneumonia ([place])   Compression fracture of thoracic spine, non-traumatic (HCC)   AKI (acute kidney injury) ([place])   Hypotension   Sepsis Secondary to pneumonia. Physiology improved with IV fluids and antibiotics.  Healthcare associated Pneumonia Patient reports improvement in cough. Non-productive -discontinue Vanc/Cefepime -start Ceftin per ID recommendations -blood cultures pending (no growth to date) -continue Mucinex -continue Tessalon perles -continue Duoneb/Symbicort -ambulate with pulse ox  Thoracic spine compression fracture Severe fracture of T7. Too high risk for IR. -neurosurgery: outpatient follow-up  Acute kidney injury on chronic CKD stage IV Improved with IVF  Chronic anemia Hemoglobin improved with transfusion of 1 unit PRBC. Dyspnea improved.  GERD -continue Pepcid and protonix  CAD S/p cath and stent placement. Plavix discontinued last admission secondary to GI bleed. No chest pain except with cough -continue statin and Aspirin  BPH -continue Proscar and Flomax  Hyperlipidemia -continue statin  Wegener's -continue prednisone and Cytoxan    DVT prophylaxis: Heparin Code Status: Full code Family Communication: None at bedside Disposition Plan:  Discharge in 24 hours   Consultants:   Neurosurgery  Interventional radiology  Procedures:   None  Antimicrobials:  Vancomycin  Cefepime    Subjective: No chest pain or dyspnea.  Objective: Vitals:   01/08/17 0346 01/08/17 0800 01/08/17 0951 01/08/17 1157  BP: (!) 145/91 134/85  (!) 146/72  Pulse: (!) 103 83  86  Resp:  (!) 22  20  Temp: 98 F (36.7 C) 98.4 F (36.9 C)  98.6 F (37 C)  TempSrc: Oral Oral  Oral  SpO2: 98% 99% 95% 95%  Weight: 48.6 kg (107 lb 1.6 oz)     Height:        Intake/Output Summary (Last 24 hours) at 01/08/17 1515 Last data filed at 01/08/17 1300  Gross per 24 hour  Intake             1450 ml  Output              603 ml  Net              847 ml   Filed Weights   01/06/17 0611 01/07/17 0523 01/08/17 0346  Weight: 47.3 kg (104 lb 3.2 oz) 47.2 kg (104 lb) 48.6 kg (107 lb 1.6 oz)    Examination:  General exam: Appears calm and comfortable Respiratory system: Clear bilaterally. Respiratory effort normal. Cardiovascular system: S1 & S2 heard, RRR. 3/6 systolic crescendo-decrescendo murmur. Gastrointestinal system: Abdomen is nondistended, soft and nontender. Normal bowel sounds heard. Central nervous system: Alert and oriented. No focal neurological deficits. Extremities: No edema. No calf tenderness Skin: No cyanosis. No rashes Psychiatry: Judgement and insight appear normal. Mood & affect appropriate.     Data Reviewed: I have personally reviewed following labs and imaging studies  CBC:  Recent Labs Lab 01/05/17 1307 01/06/17 0519 01/06/17 4174  01/07/17 0550 01/07/17 0847 01/07/17 2040 01/08/17 0319  WBC 11.1* 4.6  --  5.3  --   --  6.5  NEUTROABS 9.0*  --   --   --   --   --   --   HGB 8.9* 7.7* 8.4* 7.6* 7.9* 8.8* 9.0*  HCT 27.7* 24.3* 26.3* 23.8* 24.7* 27.1* 27.7*  MCV 104.1* 104.7*  --  103.5*  --   --  99.3  PLT 271 172  --  166  --   --  035*   Basic Metabolic Panel:  Recent Labs Lab 01/05/17 1307  01/06/17 0519 01/07/17 0550 01/08/17 0319  NA 141 144 141 143  K 4.1 4.0 3.3* 4.5  CL 105 114* 112* 114*  CO2 26 23 20* 22  GLUCOSE 101* 84 77 76  BUN _0 23*  CREATININE 2.34* 2.01* 1.78* 1.50*  CALCIUM 9.2 8.1* 7.7* 7.7*   GFR: Estimated Creatinine Clearance: 27.9 mL/min (A) (by C-G formula based on SCr of 1.5 mg/dL (H)). Liver Function Tests:  Recent Labs Lab 01/05/17 1307  AST 23  ALT 16*  ALKPHOS 98  BILITOT 0.7  PROT 6.1*  ALBUMIN 3.1*   No results for input(s): LIPASE, AMYLASE in the last 168 hours. No results for input(s): AMMONIA in the last 168 hours. Coagulation Profile:  Recent Labs Lab 01/05/17 1307  INR 1.09   Cardiac Enzymes:  Recent Labs Lab 01/05/17 1811  TROPONINI <0.03   BNP (last 3 results) No results for input(s): PROBNP in the last 8760 hours. HbA1C: No results for input(s): HGBA1C in the last 72 hours. CBG: No results for input(s): GLUCAP in the last 168 hours. Lipid Profile: No results for input(s): CHOL, HDL, LDLCALC, TRIG, CHOLHDL, LDLDIRECT in the last 72 hours. Thyroid Function Tests: No results for input(s): TSH, T4TOTAL, FREET4, T3FREE, THYROIDAB in the last 72 hours. Anemia Panel:  Recent Labs  01/05/17 1703  FERRITIN 691*  TIBC 140*  IRON 17*   Sepsis Labs:  Recent Labs Lab 01/05/17 1343 01/05/17 1522 01/05/17 1811 01/05/17 2109  PROCALCITON  --  <0.10 <0.10  --   LATICACIDVEN 2.52*  --  1.0 1.3    Recent Results (from the past 240 hour(s))  Culture, blood (Routine x 2)     Status: None (Preliminary result)   Collection Time: 01/05/17  1:11 PM  Result Value Ref Range Status   Specimen Description BLOOD LEFT ANTECUBITAL  Final   Special Requests   Final    BOTTLES DRAWN AEROBIC AND ANAEROBIC Blood Culture results may not be optimal due to an excessive volume of blood received in culture bottles   Culture NO GROWTH 2 DAYS  Final   Report Status PENDING  Incomplete  Urine culture     Status: None    Collection Time: 01/05/17  3:33 PM  Result Value Ref Range Status   Specimen Description URINE, CLEAN CATCH  Final   Special Requests NONE  Final   Culture NO GROWTH  Final   Report Status 01/06/2017 FINAL  Final  Culture, blood (Routine x 2)     Status: None (Preliminary result)   Collection Time: 01/05/17  5:06 PM  Result Value Ref Range Status   Specimen Description BLOOD RIGHT ANTECUBITAL  Final   Special Requests IN PEDIATRIC BOTTLE Blood Culture adequate volume  Final   Culture NO GROWTH 2 DAYS  Final   Report Status PENDING  Incomplete  MRSA PCR Screening     Status:  None   Collection Time: 01/05/17  5:46 PM  Result Value Ref Range Status   MRSA by PCR NEGATIVE NEGATIVE Final    Comment:        The GeneXpert MRSA Assay (FDA approved for NASAL specimens only), is one component of a comprehensive MRSA colonization surveillance program. It is not intended to diagnose MRSA infection nor to guide or monitor treatment for MRSA infections.          Radiology Studies: Dg Chest Port 1 View  Result Date: 01/07/2017 CLINICAL DATA:  Shortness of breath with exertion. EXAM: PORTABLE CHEST 1 VIEW COMPARISON:  01/05/2017 and multiple previous FINDINGS: Heart size is normal. Chronic aortic atherosclerosis. Chronic lung disease with abnormal interstitial markings bilaterally. More focal density in the right suprahilar region and upper lobe suggesting bronchopneumonia. Follow-up to clearing recommended. IMPRESSION: Chronic lung disease as seen previously. Focal density in the right suprahilar region and right upper lobe consistent with pneumonia. Follow-up to clearing. Electronically Signed   By: Nelson Chimes M.D.   On: 01/07/2017 09:48        Scheduled Meds: . acetaminophen  1,000 mg Oral Q8H   Or  . acetaminophen  650 mg Rectal Q8H  . aspirin EC  81 mg Oral Daily  . bisacodyl  5 mg Oral BID  . cefUROXime  500 mg Oral Daily  . cyclophosphamide  25 mg Oral Daily  . famotidine   20 mg Oral Q24H  . finasteride  5 mg Oral Daily  . guaiFENesin  1,200 mg Oral BID  . heparin  5,000 Units Subcutaneous Q8H  . loratadine  10 mg Oral Daily  . mometasone-formoterol  2 puff Inhalation BID  . pantoprazole  40 mg Oral Daily  . predniSONE  7.5 mg Oral Q breakfast  . simvastatin  20 mg Oral q1800  . sodium bicarbonate  1,300 mg Oral BID  . tamsulosin  0.4 mg Oral QPC breakfast   Continuous Infusions: . sodium chloride    . methocarbamol (ROBAXIN)  IV       LOS: 3 days     Cordelia Poche, MD [facility] 01/08/2017, 3:15 PM Pager: [phone]  If 7PM-7AM, please contact night-coverage [email or web address] Password [email or web address] 01/08/2017, 3:15 PM

## 2017-01-08 NOTE — Progress Notes (Signed)
Physical Therapy Treatment Patient Details Name: Richard [name] MRN: [id number] DOB: 11-17-1935 Today's Date: 01/08/2017    History of Present Illness Patient is a 81 yo male admitted 01/05/17 with hypotension, sepsis/pna, anemia, T7 compression fracture, AKI.     PMH:  CAD, COPD, anemia, HTN, CKD    PT Comments    Patient with improved mobility and gait.  Required use of RW for balance/safety.  O2 sats from 97% rest to 89% with gait on room air.  However, HR increased to 141 during gait.  Took 5-6 minutes to recover to 102 with rest.  RN notified.   Follow Up Recommendations  Home health PT;Supervision for mobility/OOB     Equipment Recommendations  3in1 (PT)    Recommendations for Other Services       Precautions / Restrictions Precautions Precautions: Fall Restrictions Weight Bearing Restrictions: No    Mobility  Bed Mobility Overal bed mobility: Needs Assistance Bed Mobility: Supine to Sit;Sit to Supine     Supine to sit: Supervision Sit to supine: Supervision   General bed mobility comments: Supervision for safety only.  Once upright, patient with good sitting balance.  Transfers Overall transfer level: Needs assistance Equipment used: None Transfers: Sit to/from Stand Sit to Stand: Min guard         General transfer comment: Min guard for balance/safety.  Slightly unsteady during stance.  Provided patient with RW for gait.  Ambulation/Gait Ambulation/Gait assistance: Min guard Ambulation Distance (Feet): 56 Feet Assistive device: Rolling walker (2 wheeled) Gait Pattern/deviations: Step-through pattern;Decreased stride length;Shuffle;Trunk flexed Gait velocity: decreased Gait velocity interpretation: Below normal speed for age/gender General Gait Details: Verbal cues for safe use of RW.  Patient with slow, shuffling gait, with flexed posture.  O2 sats at 97% at rest with room air.  O2 sats dropped to 89% during gait.  Standing rest break and O2 sats  into 90's within 15 seconds.  However, HR increased to 141 bpm during gait.  Once seated, HR decreased to 102 in 5-6 minutes.  Dyspnea 3/4 during gait, and back to baseline breathing once seated.   Distance limited by increase in HR and dyspnea.   Stairs            Wheelchair Mobility    Modified Rankin (Stroke Patients Only)       Balance Overall balance assessment: Needs assistance   Sitting balance-Leahy Scale: Good     Standing balance support: Single extremity supported Standing balance-Leahy Scale: Poor                              Cognition Arousal/Alertness: Awake/alert Behavior During Therapy: WFL for tasks assessed/performed (Smiling today) Overall Cognitive Status: Within Functional Limits for tasks assessed                                        Exercises      General Comments        Pertinent Vitals/Pain Pain Assessment: No/denies pain    Home Living                      Prior Function            PT Goals (current goals can now be found in the care plan section) Acute Rehab PT Goals Patient Stated Goal: To get better Progress towards  PT goals: Progressing toward goals    Frequency    Min 3X/week      PT Plan Current plan remains appropriate    Co-evaluation              AM-PAC PT "6 Clicks" Daily Activity  Outcome Measure  Difficulty turning over in bed (including adjusting bedclothes, sheets and blankets)?: None Difficulty moving from lying on back to sitting on the side of the bed? : None Difficulty sitting down on and standing up from a chair with arms (e.g., wheelchair, bedside commode, etc,.)?: A Little Help needed moving to and from a bed to chair (including a wheelchair)?: A Little Help needed walking in hospital room?: A Little Help needed climbing 3-5 steps with a railing? : A Little 6 Click Score: 20    End of Session Equipment Utilized During Treatment: Gait belt Activity  Tolerance: Patient limited by fatigue;Treatment limited secondary to medical complications (Comment) (Increase in HR) Patient left: in bed;with call bell/phone within reach;with bed alarm set;with family/visitor present Nurse Communication: Mobility status (O2 sats and HR with gait.) PT Visit Diagnosis: Unsteadiness on feet (R26.81);Muscle weakness (generalized) (M62.81);Difficulty in walking, not elsewhere classified (R26.2)     Time: 3151-7616 PT Time Calculation (min) (ACUTE ONLY): 21 min  Charges:  $Gait Training: 8-22 mins                    G Codes:       Carita Pian. Sanjuana Kava, [facility] Acute Rehab Services Pager [phone]    Despina Pole 01/08/2017, 3:03 PM

## 2017-01-08 NOTE — Evaluation (Signed)
Occupational Therapy Evaluation Patient Details Name: Richard [name] MRN: [id number] DOB: 1936-06-28 Today's Date: 01/08/2017    History of Present Illness Patient is a 81 yo male admitted 01/05/17 with hypotension, sepsis/pna, anemia, T7 compression fracture, AKI.     PMH:  CAD, COPD, anemia, HTN, CKD   Clinical Impression   Pt reports he was independent with ADL PTA. Currently pt overall min guard for ADL and functional mobility. DOE 2/4, SpO2 >90% on RA throughout. Began education on energy conservation strategies for home; pt and family verbalized understanding. Pt planning to d/c home with 24/7 supervision from his wife. Pt would benefit from continued skilled OT to address established goals.    Follow Up Recommendations  No OT follow up;Supervision/Assistance - 24 hour    Equipment Recommendations  None recommended by OT    Recommendations for Other Services       Precautions / Restrictions Precautions Precautions: Fall Restrictions Weight Bearing Restrictions: No      Mobility Bed Mobility Overal bed mobility: Modified Independent Bed Mobility: Supine to Sit;Sit to Supine     Supine to sit: Supervision Sit to supine: Supervision   General bed mobility comments: Supervision for safety only.  Once upright, patient with good sitting balance.  Transfers Overall transfer level: Needs assistance Equipment used: None Transfers: Sit to/from Stand Sit to Stand: Min guard         General transfer comment: for safety, slightly unsteady but no LOB    Balance Overall balance assessment: Needs assistance   Sitting balance-Leahy Scale: Good     Standing balance support: Single extremity supported Standing balance-Leahy Scale: Fair                             ADL either performed or assessed with clinical judgement   ADL Overall ADL's : Needs assistance/impaired Eating/Feeding: Independent;Sitting   Grooming: Min guard;Standing   Upper Body  Bathing: Set up;Supervision/ safety;Sitting   Lower Body Bathing: Min guard;Sit to/from stand   Upper Body Dressing : Set up;Supervision/safety;Sitting   Lower Body Dressing: Min guard;Sit to/from stand   Toilet Transfer: Min guard;Ambulation;Regular Museum/gallery exhibitions officer and Hygiene: Min guard;Sit to/from stand   Tub/ Shower Transfer: Min guard;Walk-in shower;Ambulation   Functional mobility during ADLs: Min guard General ADL Comments: Educated pt on use of shower chair for safety and energy conservation during bathing. Discussed other energy conservation strategies for home but would benefit from handout. SpO2 >90% on RA throghout, DOE 2/3.     Vision         Perception     Praxis      Pertinent Vitals/Pain Pain Assessment: No/denies pain     Hand Dominance     Extremity/Trunk Assessment Upper Extremity Assessment Upper Extremity Assessment: Generalized weakness   Lower Extremity Assessment Lower Extremity Assessment: Defer to PT evaluation       Communication Communication Communication: HOH   Cognition Arousal/Alertness: Awake/alert Behavior During Therapy: WFL for tasks assessed/performed Overall Cognitive Status: Within Functional Limits for tasks assessed                                     General Comments       Exercises     Shoulder Instructions      Home Living Family/patient expects to be discharged to:: Private residence Living Arrangements: Spouse/significant other Available  Help at Discharge: Family;Available 24 hours/day Type of Home: House Home Access: Stairs to enter [employer] of Steps: 2 Entrance Stairs-Rails: None Home Layout: One level     Bathroom Shower/Tub: Occupational psychologist: Standard     Home Equipment: Environmental consultant - 2 wheels;Cane - single point;Tub bench          Prior Functioning/Environment Level of Independence: Independent                 OT  Problem List: Decreased strength;Decreased activity tolerance;Impaired balance (sitting and/or standing);Decreased knowledge of use of DME or AE;Cardiopulmonary status limiting activity      OT Treatment/Interventions: Self-care/ADL training;Energy conservation;DME and/or AE instruction;Therapeutic activities;Patient/family education;Balance training    OT Goals(Current goals can be found in the care plan section) Acute Rehab OT Goals Patient Stated Goal: To get better OT Goal Formulation: With patient Time For Goal Achievement: 01/22/17 Potential to Achieve Goals: Good ADL Goals Additional ADL Goal #1: Pt will independently verbally recall 3 energy conservation strategies and use during ADL. Additional ADL Goal #2: Pt will gather ADL items and perform UB/LB bathing/dressing with mod I.  OT Frequency: Min 2X/week   Barriers to D/C:            Co-evaluation              AM-PAC PT "6 Clicks" Daily Activity     Outcome Measure Help from another person eating meals?: None Help from another person taking care of personal grooming?: A Little Help from another person toileting, which includes using toliet, bedpan, or urinal?: A Little Help from another person bathing (including washing, rinsing, drying)?: A Little Help from another person to put on and taking off regular upper body clothing?: A Little Help from another person to put on and taking off regular lower body clothing?: A Little 6 Click Score: 19   End of Session Equipment Utilized During Treatment: Gait belt  Activity Tolerance: Patient tolerated treatment well Patient left: in chair;with call bell/phone within reach;with family/visitor present  OT Visit Diagnosis: Unsteadiness on feet (R26.81)                Time: 0240-9735 OT Time Calculation (min): 13 min Charges:  OT General Charges $OT Visit: 1 Procedure OT Evaluation $OT Eval Moderate Complexity: 1 Procedure G-Codes:     Katricia Prehn A. Ulice Brilliant, M.S.,  OTR/L Pager: [place] 01/08/2017, 4:37 PM

## 2017-01-08 NOTE — Consult Note (Addendum)
[facility] for Infectious Disease  Total days of antibiotics 4        Day 1               Reason for Consult: pneumonia   Referring Physician: netty  Active Problems:   Wegener's granulomatosis ([place])   Sepsis ([place])   Lobar pneumonia ([place])   Compression fracture of thoracic spine, non-traumatic ([place])   AKI (acute kidney injury) ([place])   Hypotension    HPI: Richard [name] is a 81 y.o. male with history of bronchiectasis/wegener's granulmoatosis disease involving lung/kidney with baseline cr of 1.5- 1.7 on rituximab, CAD, recent GI bleed and recent T7 compression fracture was admitted on 5/31 for feeling poorly, slumpted in chair, found to have temp of 100.4, HR 133 with Sbp 90s. He reported 7-10d hx of productive cough, DOE, pleuretic chest pain in addition to NS, and chills. He had started levofloxacin 3 days prior to admit without much improvement. His wbc was 11K, LA of 2.52. Cr of 2.34 up from 1.5 baseline. His cxr showed patchy infiltrate bilaterally in comparison to films from April 2018 (taken for back pain), per my read. He was started on vancomycin and cefepime improving steadily. Using flutter valve to help with clearing phlegm. He was also seen by neurosurgery who recommended a brace til he is seen in follow up. Infectious work up including blood cx and urine cx are ngtd. Unfortunately no respiratory cultures were taken. Ur strep pneumonia was negative. Legionella antigen was not collected.  I have reviewed his records per  link  Past Medical History:  Diagnosis Date  . Adenomatous colon polyp   . Anemia   . Anemia   . Asthma   . BPH (benign prostatic hyperplasia)    sees Dr. Risa Grill, biopsy June 2015 was benign   . Bronchiectasis ([place])   . CAD (coronary artery disease)    a. [place] s/p [place] x3 and ultimately BMS to pLAD (vision BMS 3.0 x 18), POBA to CTO of Cx-OM 03/17/14 and staged Rotablator-PCI w/ BMS (Mini Vision 2.5 x 28) to mRCA on 03/21/14  . Chronic  bronchitis ([place])   . COPD (chronic obstructive pulmonary disease) ([place])   . Diverticulosis   . Femoral artery pseudo-aneurysm, right ([place])    a. s/p repair. Follow up with Dr. Kellie Simmering   . Fungal infection    lungs  . GERD (gastroesophageal reflux disease)   . Hiatal hernia   . History of blood transfusion 07/2016; 11/01/2016   "low blood; low blood"  . History of kidney stones   . HOH (hard of hearing)    bilaterally  . HTN (hypertension) 09/28/2013  . Moderate aortic stenosis by prior echocardiogram 07/08/2016   Echo 10/2015: Now Moderate AD (AVA ~1.2 cm) EF 60-65%. GR 1 DD. Echo 11/03/16:EF 60-65% with moderate LVH. Only noted grade 1 diastolic dysfunction. Moderate AS with moderate calcification and thickening. (AVA ~1.2-1.3 cm)  . Peptic stricture of esophagus   . Pneumonia    "1-2 times" (11/01/2016)  . Wegener's granulomatosis ([place])    sees Dr. [name] ; "went from my lungs to my kidneys" (11/01/2016)    Allergies: No Known Allergies  MEDICATIONS: . acetaminophen  1,000 mg Oral Q8H   Or  . acetaminophen  650 mg Rectal Q8H  . aspirin EC  81 mg Oral Daily  . bisacodyl  5 mg Oral BID  . cyclophosphamide  25 mg Oral Daily  . famotidine  20 mg  Oral Q24H  . finasteride  5 mg Oral Daily  . guaiFENesin  1,200 mg Oral BID  . heparin  5,000 Units Subcutaneous Q8H  . levofloxacin  750 mg Oral Q48H  . loratadine  10 mg Oral Daily  . mometasone-formoterol  2 puff Inhalation BID  . pantoprazole  40 mg Oral Daily  . predniSONE  7.5 mg Oral Q breakfast  . simvastatin  20 mg Oral q1800  . sodium bicarbonate  1,300 mg Oral BID  . tamsulosin  0.4 mg Oral QPC breakfast    Social History  Substance Use Topics  . Smoking status: Never Smoker  . Smokeless tobacco: Never Used  . Alcohol use No    Family History  Problem Relation Age of Onset  . Asthma Father   . Coronary artery disease Mother   . Coronary artery disease Brother   . Coronary artery disease Brother   . Colon cancer  Neg Hx   . Stomach cancer Neg Hx    Review of Systems  Constitutional: + for fever, chills, diaphoresis, activity change, appetite change, fatigue and unexpected weight change.  HENT: Negative for congestion, sore throat, rhinorrhea, sneezing, trouble swallowing and sinus pressure.  Eyes: Negative for photophobia and visual disturbance.  Respiratory: + for cough, chest tightness, shortness of breath, wheezing and stridor.  Cardiovascular: Negative for chest pain, palpitations and leg swelling.  Gastrointestinal: Negative for nausea, vomiting, abdominal pain, diarrhea, constipation, blood in stool, abdominal distention and anal bleeding.  Genitourinary: Negative for dysuria, hematuria, flank pain and difficulty urinating.  Musculoskeletal: Negative for myalgias, back pain, joint swelling, arthralgias and gait problem.  Skin: Negative for color change, pallor, rash and wound.  Neurological: Negative for dizziness, tremors, weakness and light-headedness.  Hematological: Negative for adenopathy. Does not bruise/bleed easily.  Psychiatric/Behavioral: Negative for behavioral problems, confusion, sleep disturbance, dysphoric mood, decreased concentration and agitation.     OBJECTIVE: Temp:  [98 F (36.7 C)-98.7 F (37.1 C)] 98.6 F (37 C) (06/03 1157) Pulse Rate:  [78-103] 86 (06/03 1157) Resp:  [18-22] 20 (06/03 1157) BP: (128-148)/(71-94) 146/72 (06/03 1157) SpO2:  [95 %-100 %] 95 % (06/03 1157) FiO2 (%):  [2 %] 2 % (06/02 2038) Weight:  [107 lb 1.6 oz (48.6 kg)] 107 lb 1.6 oz (48.6 kg) (06/03 0346) Physical Exam  Constitutional: He is oriented to person, place, and time. He appears well-developed and well-nourished. No distress.  HENT:  Mouth/Throat: Oropharynx is clear and moist. No oropharyngeal exudate.  Cardiovascular: Normal rate, regular rhythm and normal heart sounds. +3 SEM Pulmonary/Chest: Effort normal and breath sounds normal. occ rhonchi cleared with cough Abdominal: Soft.  Bowel sounds are normal. He exhibits no distension. There is no tenderness.  Lymphadenopathy:  He has no cervical adenopathy.  Neurological: He is alert and oriented to person, place, and time.  Skin: Skin is warm and dry. No rash noted. No erythema.  Psychiatric: He has a normal mood and affect. His behavior is normal.     LABS: Results for orders placed or performed during the hospital encounter of 01/05/17 (from the past 48 hour(s))  Basic metabolic panel     Status: Abnormal   Collection Time: 01/07/17  5:50 AM  Result Value Ref Range   Sodium 141 135 - 145 mmol/L   Potassium 3.3 (L) 3.5 - 5.1 mmol/L   Chloride 112 (H) 101 - 111 mmol/L   CO2 20 (L) 22 - 32 mmol/L   Glucose, Bld 77 65 - 99 mg/dL  BUN 20 6 - 20 mg/dL   Creatinine, Ser 1.78 (H) 0.61 - 1.24 mg/dL   Calcium 7.7 (L) 8.9 - 10.3 mg/dL   GFR calc non Af Amer 35 (L) >60 mL/min   GFR calc Af Amer 40 (L) >60 mL/min    Comment: (NOTE) The eGFR has been calculated using the CKD EPI equation. This calculation has not been validated in all clinical situations. eGFR's persistently <60 mL/min signify possible Chronic Kidney Disease.    Anion gap 9 5 - 15  CBC     Status: Abnormal   Collection Time: 01/07/17  5:50 AM  Result Value Ref Range   WBC 5.3 4.0 - 10.5 K/uL   RBC 2.30 (L) 4.22 - 5.81 MIL/uL   Hemoglobin 7.6 (L) 13.0 - 17.0 g/dL   HCT 23.8 (L) 39.0 - 52.0 %   MCV 103.5 (H) 78.0 - 100.0 fL   [name] 33.0 26.0 - 34.0 pg   MCHC 31.9 30.0 - 36.0 g/dL   RDW 16.7 (H) 11.5 - 15.5 %   Platelets 166 150 - 400 K/uL  Hemoglobin and hematocrit, blood     Status: Abnormal   Collection Time: 01/07/17  8:47 AM  Result Value Ref Range   Hemoglobin 7.9 (L) 13.0 - 17.0 g/dL   HCT 24.7 (L) 39.0 - 52.0 %  Prepare RBC     Status: None   Collection Time: 01/07/17  2:30 PM  Result Value Ref Range   Order Confirmation ORDER PROCESSED BY BLOOD BANK   Hemoglobin and hematocrit, blood     Status: Abnormal   Collection Time: 01/07/17   8:40 PM  Result Value Ref Range   Hemoglobin 8.8 (L) 13.0 - 17.0 g/dL   HCT 27.1 (L) 39.0 - 57.4 %  Basic metabolic panel     Status: Abnormal   Collection Time: 01/08/17  3:19 AM  Result Value Ref Range   Sodium 143 135 - 145 mmol/L   Potassium 4.5 3.5 - 5.1 mmol/L   Chloride 114 (H) 101 - 111 mmol/L   CO2 22 22 - 32 mmol/L   Glucose, Bld 76 65 - 99 mg/dL   BUN 23 (H) 6 - 20 mg/dL   Creatinine, Ser 1.50 (H) 0.61 - 1.24 mg/dL   Calcium 7.7 (L) 8.9 - 10.3 mg/dL   GFR calc non Af Amer 43 (L) >60 mL/min   GFR calc Af Amer 50 (L) >60 mL/min    Comment: (NOTE) The eGFR has been calculated using the CKD EPI equation. This calculation has not been validated in all clinical situations. eGFR's persistently <60 mL/min signify possible Chronic Kidney Disease.    Anion gap 7 5 - 15  CBC     Status: Abnormal   Collection Time: 01/08/17  3:19 AM  Result Value Ref Range   WBC 6.5 4.0 - 10.5 K/uL   RBC 2.79 (L) 4.22 - 5.81 MIL/uL   Hemoglobin 9.0 (L) 13.0 - 17.0 g/dL   HCT 27.7 (L) 39.0 - 52.0 %   MCV 99.3 78.0 - 100.0 fL   [name] 32.3 26.0 - 34.0 pg   MCHC 32.5 30.0 - 36.0 g/dL   RDW 18.3 (H) 11.5 - 15.5 %   Platelets 146 (L) 150 - 400 K/uL    MICRO: reviewed IMAGING: Dg Chest Port 1 View  Result Date: 01/07/2017 CLINICAL DATA:  Shortness of breath with exertion. EXAM: PORTABLE CHEST 1 VIEW COMPARISON:  01/05/2017 and multiple previous FINDINGS: Heart size is normal. Chronic aortic  atherosclerosis. Chronic lung disease with abnormal interstitial markings bilaterally. More focal density in the right suprahilar region and upper lobe suggesting bronchopneumonia. Follow-up to clearing recommended. IMPRESSION: Chronic lung disease as seen previously. Focal density in the right suprahilar region and right upper lobe consistent with pneumonia. Follow-up to clearing. Electronically Signed   By: Nelson Chimes M.D.   On: 01/07/2017 09:48    Assessment/Plan:  81yo M with wegeners, bronchiectasis, CAD,  anemia admitted for pneumonia after little response with levofloxacin x 3 days found to have pneumonia  - currently on day 4 of IV abtx improved. He is set to go home per primary team.  - Recommend to change to cefuroxime to treat remaining 3 days to complete 10 day course of therapy (including the doses coming into the hospital) - appears improving thus I don't think this would be legionella  Bronchiectasis = continue with flutter valve and baselin inhalers  aki - likely acute on chronic kidney disease, possibly due to dehydration since family reports poor po intake. unsure if his cytoxan would need to be adjusted, defer to dr deterding. Appears improved  Tachycardia = appears sinus on tele, but is at 110s at rest. May benefit to see how he does ambulating with HR and BP which maybe symptomatic.  Wegener's - have him follow up with dr deterding/wert for further management  Back pain due to T7 compression fracture sustained in April = continue with brace support  Thank you for consult.

## 2017-01-09 ENCOUNTER — Ambulatory Visit: Payer: Medicare Other | Admitting: Internal Medicine

## 2017-01-09 DIAGNOSIS — N179 Acute kidney failure, unspecified: Secondary | ICD-10-CM

## 2017-01-09 DIAGNOSIS — M3130 Wegener's granulomatosis without renal involvement: Secondary | ICD-10-CM

## 2017-01-09 DIAGNOSIS — J181 Lobar pneumonia, unspecified organism: Secondary | ICD-10-CM

## 2017-01-09 DIAGNOSIS — D649 Anemia, unspecified: Secondary | ICD-10-CM

## 2017-01-09 DIAGNOSIS — J189 Pneumonia, unspecified organism: Secondary | ICD-10-CM

## 2017-01-09 DIAGNOSIS — M4854XA Collapsed vertebra, not elsewhere classified, thoracic region, initial encounter for fracture: Secondary | ICD-10-CM

## 2017-01-09 DIAGNOSIS — E785 Hyperlipidemia, unspecified: Secondary | ICD-10-CM

## 2017-01-09 DIAGNOSIS — E860 Dehydration: Secondary | ICD-10-CM

## 2017-01-09 DIAGNOSIS — A419 Sepsis, unspecified organism: Principal | ICD-10-CM

## 2017-01-09 DIAGNOSIS — N400 Enlarged prostate without lower urinary tract symptoms: Secondary | ICD-10-CM

## 2017-01-09 DIAGNOSIS — I2510 Atherosclerotic heart disease of native coronary artery without angina pectoris: Secondary | ICD-10-CM

## 2017-01-09 DIAGNOSIS — K219 Gastro-esophageal reflux disease without esophagitis: Secondary | ICD-10-CM

## 2017-01-09 MED ORDER — DM-GUAIFENESIN ER 30-600 MG PO TB12: 1.0000 | ORAL_TABLET | Freq: Two times a day (BID) | ORAL | 0 refills | Status: AC

## 2017-01-09 MED ORDER — CEFUROXIME AXETIL 500 MG PO TABS: 500.0000 mg | ORAL_TABLET | Freq: Every day | ORAL | 0 refills | Status: AC

## 2017-01-09 MED ORDER — METOPROLOL SUCCINATE ER 25 MG PO TB24
25.0000 mg | ORAL_TABLET | Freq: Every day | ORAL | Status: DC
  Administered 2017-01-09: 25 mg via ORAL
  Filled 2017-01-09: qty 1

## 2017-01-09 MED ORDER — ACETAMINOPHEN 500 MG PO TABS: 1000.0000 mg | ORAL_TABLET | Freq: Three times a day (TID) | ORAL | 0 refills | Status: AC

## 2017-01-09 MED ORDER — CALCITRIOL 0.25 MCG PO CAPS
0.2500 ug | ORAL_CAPSULE | Freq: Every day | ORAL | Status: DC
  Administered 2017-01-09: 0.25 ug via ORAL
  Filled 2017-01-09: qty 1

## 2017-01-09 MED ORDER — BENZONATATE 200 MG PO CAPS: 200.0000 mg | ORAL_CAPSULE | Freq: Three times a day (TID) | ORAL | 0 refills | Status: AC | PRN

## 2017-01-09 NOTE — Care Management Note (Signed)
Case Management Note  Patient Details  Name: Richard [name] MRN: [id number] Date of Birth: 23-Feb-1936  Subjective/Objective: Pt presented for Sepsis. Plan for d/c home today with support of wife. Pt has DME RW at home. Pt will only need HH PT Services.                    Action/Plan: Agency List provided and pt chose [facility] for services. Referral given to [facility] with [facility]- SOC to begin within 24-48 hours of d/c. No DME needs today. No further needs from CM at this time.   Expected Discharge Date:  01/09/17               Expected Discharge Plan:  [place]  In-House Referral:  NA  Discharge planning Services  CM Consult  Post Acute Care Choice:  Home Health Choice offered to:  Patient  DME Arranged:  N/A DME Agency:  NA  HH Arranged:  PT [place] Agency:  [place]  Status of Service:  Completed, signed off  If discussed at [place] of Stay Meetings, dates discussed:    Additional Comments:  Bethena Roys, RN 01/09/2017, 11:58 AM

## 2017-01-09 NOTE — Discharge Summary (Signed)
Physician Discharge Summary  Richard [name]:[id number] DOB: 01/28/1936 DOA: 01/05/2017  PCP: [name], MD  Admit date: 01/05/2017 Discharge date: 01/09/2017  Time spent: 65 minutes  Recommendations for Outpatient Follow-up:  1. Follow-up with [name], MD in 1-2 weeks. On follow-up patient will need a basic metabolic profile and a CBC done to follow-up on electrolytes and renal function and H&H.. 2. Follow-up with Richard.Ditty, neurosurgery in 4 weeks for repeat xrays and monitoring of T7 compression fracture.   Discharge Diagnoses:  Principal Problem:   Sepsis ([place]) Active Problems:   Lobar pneumonia ([place])   Hyperlipidemia with target low density lipoprotein (LDL) cholesterol less than 70 mg/dL   Wegener's granulomatosis (H[facility])   GERD   BPH (benign prostatic hyperplasia)   Anemia   CAD in native artery   Compression fracture of thoracic spine, non-traumatic (HCC)   AKI (acute kidney injury) ([place])   Hypotension   Dehydration   HCAP (healthcare-associated pneumonia)   Discharge Condition: Stable and improved  Diet recommendation: Heart healthy  Filed Weights   01/07/17 0523 01/08/17 0346 01/09/17 0527  Weight: 47.2 kg (104 lb) 48.6 kg (107 lb 1.6 oz) 48 kg (105 lb 12.8 oz)    History of present illness:  Per Richard [name] is a 81 y.o. male with medical history significant of BPH, CAD, COPD, diverticulosis, anemia, GERD, hiatal hernia, Wegener's granulomatosis, PUD, hypertension, kidney stones.  Patient presented to short stay Center at [facility] for IV iron infusion when nursing staff noticed patient was slumped over his wheelchair. When asked patient how he was doing he stated "feel weak as water." Patient was noted to be tachycardic with a heart rate of 133 and a blood pressure of 91/52. Patient transported to the emergency room for emergent evaluation. Patient endorsed a 7-10 day history of worsening chest congestion with productive cough  night sweats, progressive weakness and shortness of breath. Patient endorsed exertional pleuritic chest pain. Patient started taking Levaquin 3 days prior to admission, as he had a standing order for this due to his condition. No improvement on Levaquin.  Patient with an additional complaint of upper back, lower neck pain. This started on approximately 12/23/2016. As patient was attempting to dress that morning he noted significant pain in his upper back and lower neck region. Worse with movement. Patient unable to lay down on his back as he normally does. Patient thinks this may have the results of exerting himself physically the day prior while mowing the lawn and having to lift several heavy objects.  Of note patient with recent admission for GI bleed for which he stopped his Plavix and has had no further episodes of GI bleed.    ED Course: Patient given fluid boluses and started on broad-spectrum antibiotics.  Hospital Course:  Sepsis secondary to healthcare associated pneumonia. Patient was admitted with criteria for sepsis including the fever with a temperature 100.4, tachycardia with heart rates of 314 systolic blood pressures in the 90s. Patient was also noted to have respiratory symptoms of a productive cough, pleuritic chest pain, shortness of breath and chills as well as cough. Patient was placed empirically on IV antibiotics of cefepime, vancomycin. Patient was pancultured. Patient was placed on Mucinex, Tessalon Perles, DuoNeb/Symbicort. Patient was also placed on Mucinex and Tessalon Perles. ID was consulted and followed the patient during the hospitalization. Patient improved clinically was transitioned to oral Ceftin. Patient was discharged on 3 more days of oral Ceftin to complete  a ten-day course of antibiotic treatment including antibiotics prior to admission. Patient was afebrile on the day of discharge.  Thoracic spine compression fracture Patient had presented with back  pain that had started 2-3 weeks ago prior to admission after lifting a 5 gallon full gas can over his back and supposedly tweaked his back. Patient's pain worsened progressively and was placed on narcotics which he took for several days and then discontinued secondary to improvement with back pain. Neurosurgery was consulted and patient was in consultation by Richard. Cyndy Freeze, who had recommended placing patient in [place] brace with outpatient follow-up in 4 weeks for repeat imaging and monitoring. Patient was placed on scheduled Tylenol which should be discharged on. Patient will follow-up with neurosurgery, Richard. Cyndy Freeze in 4 weeks.   Acute kidney injury on chronic CKD stage IV Improved with IVF and was at baseline by day of discharge  Chronic anemia Hemoglobin improved with transfusion of 1 unit PRBC. Dyspnea improved.  GERD -Continued on home regimen of  Pepcid and protonix  CAD S/p cath and stent placement. Plavix discontinued last admission secondary to GI bleed. No chest pain except with cough -Patient remained in stable condition and was maintained on statin and aspirin. Outpatient follow-up.   BPH -Patient maintained on home regimen of Proscar and Flomax  Hyperlipidemia -continued on statin  Wegener's -Patient was maintained on home regimen of prednisone and Cytoxan  Procedures:  Chest x-ray 01/05/2017, 01/07/2017    Consultations:  Neurosurgeon:Richard. Ditty 01/06/2017  Infectious diseases: Richard. Baxter Flattery 01/08/2017  Discharge Exam: Vitals:   01/09/17 0748 01/09/17 1111  BP: (!) 164/89 (!) 148/91  Pulse: (!) 101 90  Resp: 18 (!) 25  Temp: 98.9 F (37.2 C) 98.2 F (36.8 C)    General: NAD Cardiovascular: RRR WITH 3/6 SEM Respiratory: CTAB  Discharge Instructions   Discharge Instructions    Diet - low sodium heart healthy    Complete by:  As directed    Increase activity slowly    Complete by:  As directed      Current Discharge Medication List     START taking these medications   Details  acetaminophen (TYLENOL) 500 MG tablet Take 2 tablets (1,000 mg total) by mouth every 8 (eight) hours. Qty: 30 tablet, Refills: 0    benzonatate (TESSALON) 200 MG capsule Take 1 capsule (200 mg total) by mouth 3 (three) times daily as needed for cough. Qty: 20 capsule, Refills: 0    cefUROXime (CEFTIN) 500 MG tablet Take 1 tablet (500 mg total) by mouth daily. Qty: 3 tablet, Refills: 0      CONTINUE these medications which have CHANGED   Details  dextromethorphan-guaiFENesin (MUCINEX DM) 30-600 MG 12hr tablet Take 1 tablet by mouth 2 (two) times daily. Qty: 6 tablet, Refills: 0      CONTINUE these medications which have NOT CHANGED   Details  albuterol (VENTOLIN HFA) 108 (90 Base) MCG/ACT inhaler Inhale 1-2 puffs into the lungs every 4 (four) hours as needed for wheezing or shortness of breath. Qty: 1 Inhaler, Refills: 2    aspirin EC 81 MG tablet Take 81 mg by mouth daily.    b complex vitamins tablet Take 1 tablet by mouth daily.      bisacodyl (DULCOLAX) 5 MG EC tablet Take 5 mg by mouth 2 (two) times daily.     calcitRIOL (ROCALTROL) 0.25 MCG capsule Take 0.25 mcg by mouth daily.  Refills: 4    cyclophosphamide (CYTOXAN) 25 MG capsule Take 25 mg  by mouth daily. Take one capsule by mouth daily on an empty stomach 1 hour before or 2 hours after meal. Refills: 0    finasteride (PROSCAR) 5 MG tablet Take 1 tablet (5 mg total) by mouth daily. Qty: 90 tablet, Refills: 3    guaiFENesin-codeine 100-10 MG/5ML syrup Take 10 mLs by mouth every 6 (six) hours as needed for cough. Qty: 120 mL, Refills: 0    ipratropium-albuterol (DUONEB) 0.5-2.5 (3) MG/3ML SOLN Take 3 mLs by nebulization every 4 (four) hours as needed (if inhaler is not effective in resolving shortness of breath or wheezing). Qty: 360 mL, Refills: 11    loratadine (CLARITIN) 10 MG tablet Take 10 mg by mouth daily.     Methylcellulose, Laxative, (CITRUCEL PO) Take 1  packet by mouth daily.    metoprolol succinate (TOPROL-XL) 25 MG 24 hr tablet Take 1 tablet (25 mg total) by mouth daily. Qty: 90 tablet, Refills: 1    nitroGLYCERIN (NITROSTAT) 0.4 MG SL tablet Place 1 tablet (0.4 mg total) under the tongue every 5 (five) minutes x 3 doses as needed for chest pain. Qty: 25 tablet, Refills: 12    ondansetron (ZOFRAN) 4 MG tablet Take 1 tablet (4 mg total) by mouth every 8 (eight) hours as needed for nausea or vomiting. Qty: 10 tablet, Refills: 0    pantoprazole (PROTONIX) 40 MG tablet Take 40 mg by mouth daily. Refills: 0    predniSONE (DELTASONE) 5 MG tablet Take 7.5 mg by mouth daily with breakfast.    ranitidine (ZANTAC) 300 MG tablet Take 1 tablet (300 mg total) by mouth 2 (two) times daily. Qty: 2 tablet, Refills: 0    simethicone (MYLICON) 161 MG chewable tablet Chew 250 mg by mouth 2 (two) times daily as needed for flatulence. TAKE PER BOX FOR GAS     simvastatin (ZOCOR) 20 MG tablet TAKE ONE TABLET BY MOUTH ONCE DAILY AT  6  PM Qty: 90 tablet, Refills: 3    sodium bicarbonate 650 MG tablet Take 2 tablets (1,300 mg total) by mouth 2 (two) times daily. NEED OV. Qty: 120 tablet, Refills: 1    sodium chloride (OCEAN) 0.65 % SOLN nasal spray Place 1 spray into both nostrils as needed for congestion.    SYMBICORT 160-4.5 MCG/ACT inhaler INHALE TWO PUFFS BY MOUTH TWICE DAILY Qty: 3 Inhaler, Refills: 3    tamsulosin (FLOMAX) 0.4 MG CAPS capsule TAKE ONE CAPSULE BY MOUTH ONCE DAILY IN THE EVENING Qty: 30 capsule, Refills: 5    oxyCODONE-acetaminophen (PERCOCET) 10-325 MG tablet Take 1 tablet by mouth every 6 (six) hours as needed for pain. Qty: 60 tablet, Refills: 0       No Known Allergies Follow-up Information    [name], MD. Schedule an appointment as soon as possible for a visit in 1 week(s).   Specialty:  Family Medicine Why:  f/u in 1-2 weeks. Contact information: [place]  [address] [phone]        Ditty, Kevan Ny, MD. Schedule an appointment as soon as possible for a visit in 4 week(s).   Specialty:  Neurosurgery Why:  f/u in 4 weeks. Contact information: [place] [address] [phone]            The results of significant diagnostics from this hospitalization (including imaging, microbiology, ancillary and laboratory) are listed below for reference.    Significant Diagnostic Studies: Dg Chest 2 View  Result Date: 01/05/2017 CLINICAL  DATA:  Weakness and fatigue and cough for the past 10 days. History of COPD, bronchiectasis, previous episodes of pneumonia, Wegner's granulomatosis. EXAM: CHEST  2 VIEW COMPARISON:  Chest x-ray of April 27th 2018 FINDINGS: The lungs remain hyperinflated. The interstitial markings are increased bilaterally and on the left are more conspicuous in the mid lung field. There is no alveolar infiltrate. The heart and pulmonary vascularity are normal. There is calcification in the wall of the aortic arch. There is a hiatal hernia. The bony thorax exhibits no acute abnormality. IMPRESSION: Chronic bronchitic change and it interstitial change. Superimposed new interstitial infiltrate in the left mid lung which may reflect mild interstitial pneumonia or active inflammatory disease. Electronically Signed   By: David  Martinique M.D.   On: 01/05/2017 14:08   Mr Thoracic Spine Wo Contrast  Result Date: 12/30/2016 CLINICAL DATA:  Thoracic spine compression fracture. Mid back pain for 2 weeks. EXAM: MRI THORACIC SPINE WITHOUT CONTRAST TECHNIQUE: Multiplanar, multisequence MR imaging of the thoracic spine was performed. No intravenous contrast was administered. COMPARISON:  PA and lateral chest 11/03/2016.  CT chest 07/30/2016. FINDINGS: Alignment:  Mild kyphosis is seen about the T7 level. Vertebrae: The patient has a severe compression fracture of T7 with vertebra plana deformity identified. There is little  to no marrow edema within the vertebral body but the fracture is new since the 10/25/2016 examination consistent with acute or subacute injury. Absence of marrow edema is likely due to complete compression of the vertebral body. Also seen is edema between the T6-7 and T7-8 spinous processes consistent with ligamentous injury. Edema is more intense at T7-8. No other fracture is identified. No worrisome lesion is seen. Cord:  Normal signal throughout. Paraspinal and other soft tissues: Hiatal hernia is noted. Right lower lobe bronchiectasis and mucoid impaction are seen as on prior chest CT. Disc levels: No disc bulge or protrusion is identified. Bony retropulsion off the posterior margin of T7 appears to efface the ventral thecal sac although the central canal and foramina are open. IMPRESSION: Findings consistent with an acute or subacute compression fracture of T7 with vertebral plana deformity. Soft tissue edema between the T6-7 and T7-8 spinous processes is consistent with ligamentous injury. Retropulsed bone off the inferior endplate of T7 effaces the ventral thecal sac but the central canal and foramina are open. Hiatal hernia. Electronically Signed   By: Inge Rise M.D.   On: 12/30/2016 13:25   Dg Chest Port 1 View  Result Date: 01/07/2017 CLINICAL DATA:  Shortness of breath with exertion. EXAM: PORTABLE CHEST 1 VIEW COMPARISON:  01/05/2017 and multiple previous FINDINGS: Heart size is normal. Chronic aortic atherosclerosis. Chronic lung disease with abnormal interstitial markings bilaterally. More focal density in the right suprahilar region and upper lobe suggesting bronchopneumonia. Follow-up to clearing recommended. IMPRESSION: Chronic lung disease as seen previously. Focal density in the right suprahilar region and right upper lobe consistent with pneumonia. Follow-up to clearing. Electronically Signed   By: Nelson Chimes M.D.   On: 01/07/2017 09:48    Microbiology: Recent Results (from the  past 240 hour(s))  Culture, blood (Routine x 2)     Status: None (Preliminary result)   Collection Time: 01/05/17  1:11 PM  Result Value Ref Range Status   Specimen Description BLOOD LEFT ANTECUBITAL  Final   Special Requests   Final    BOTTLES DRAWN AEROBIC AND ANAEROBIC Blood Culture results may not be optimal due to an excessive volume of blood received in culture bottles  Culture NO GROWTH 3 DAYS  Final   Report Status PENDING  Incomplete  Urine culture     Status: None   Collection Time: 01/05/17  3:33 PM  Result Value Ref Range Status   Specimen Description URINE, CLEAN CATCH  Final   Special Requests NONE  Final   Culture NO GROWTH  Final   Report Status 01/06/2017 FINAL  Final  Culture, blood (Routine x 2)     Status: None (Preliminary result)   Collection Time: 01/05/17  5:06 PM  Result Value Ref Range Status   Specimen Description BLOOD RIGHT ANTECUBITAL  Final   Special Requests IN PEDIATRIC BOTTLE Blood Culture adequate volume  Final   Culture NO GROWTH 3 DAYS  Final   Report Status PENDING  Incomplete  MRSA PCR Screening     Status: None   Collection Time: 01/05/17  5:46 PM  Result Value Ref Range Status   MRSA by PCR NEGATIVE NEGATIVE Final    Comment:        The GeneXpert MRSA Assay (FDA approved for NASAL specimens only), is one component of a comprehensive MRSA colonization surveillance program. It is not intended to diagnose MRSA infection nor to guide or monitor treatment for MRSA infections.      Labs: Basic Metabolic Panel:  Recent Labs Lab 01/05/17 1307 01/06/17 0519 01/07/17 0550 01/08/17 0319  NA 141 144 141 143  K 4.1 4.0 3.3* 4.5  CL 105 114* 112* 114*  CO2 26 23 20* 22  GLUCOSE 101* 84 77 76  BUN 20 20 20  23*  CREATININE 2.34* 2.01* 1.78* 1.50*  CALCIUM 9.2 8.1* 7.7* 7.7*   Liver Function Tests:  Recent Labs Lab 01/05/17 1307  AST 23  ALT 16*  ALKPHOS 98  BILITOT 0.7  PROT 6.1*  ALBUMIN 3.1*   No results for input(s):  LIPASE, AMYLASE in the last 168 hours. No results for input(s): AMMONIA in the last 168 hours. CBC:  Recent Labs Lab 01/05/17 1307 01/06/17 0519 01/06/17 0951 01/07/17 0550 01/07/17 0847 01/07/17 2040 01/08/17 0319  WBC 11.1* 4.6  --  5.3  --   --  6.5  NEUTROABS 9.0*  --   --   --   --   --   --   HGB 8.9* 7.7* 8.4* 7.6* 7.9* 8.8* 9.0*  HCT 27.7* 24.3* 26.3* 23.8* 24.7* 27.1* 27.7*  MCV 104.1* 104.7*  --  103.5*  --   --  99.3  PLT 271 172  --  166  --   --  146*   Cardiac Enzymes:  Recent Labs Lab 01/05/17 1811  TROPONINI <0.03   BNP: BNP (last 3 results)  Recent Labs  07/30/16 1455  BNP 287.4*    ProBNP (last 3 results) No results for input(s): PROBNP in the last 8760 hours.  CBG: No results for input(s): GLUCAP in the last 168 hours.     SignedIrine Seal MD.  [facility] 01/09/2017, 11:39 AM

## 2017-01-09 NOTE — Care Management Important Message (Signed)
Important Message  Patient Details  Name: Richard [name] MRN: [id number] Date of Birth: 1936/02/26   Medicare Important Message Given:  Yes    Nathen May 01/09/2017, 12:38 PM

## 2017-01-10 ENCOUNTER — Telehealth: Payer: Self-pay | Admitting: *Deleted

## 2017-01-10 ENCOUNTER — Telehealth: Payer: Self-pay | Admitting: Family Medicine

## 2017-01-10 ENCOUNTER — Other Ambulatory Visit: Payer: Medicare Other

## 2017-01-10 ENCOUNTER — Inpatient Hospital Stay
Admission: RE | Admit: 2017-01-10 | Discharge: 2017-01-10 | Disposition: A | Discharge status: Home / Self Care | Payer: Medicare Other | Source: Ambulatory Visit | Attending: Family Medicine | Admitting: Family Medicine

## 2017-01-10 DIAGNOSIS — D638 Anemia in other chronic diseases classified elsewhere: Secondary | ICD-10-CM

## 2017-01-10 LAB — CULTURE, BLOOD (ROUTINE X 2)
CULTURE: NO GROWTH
Culture: NO GROWTH
SPECIAL REQUESTS: ADEQUATE

## 2017-01-10 MED ORDER — PANTOPRAZOLE SODIUM 40 MG PO TBEC: 40.0000 mg | DELAYED_RELEASE_TABLET | Freq: Every day | ORAL | 5 refills | Status: DC

## 2017-01-10 MED ORDER — ONDANSETRON HCL 4 MG PO TABS: 4.0000 mg | ORAL_TABLET | Freq: Three times a day (TID) | ORAL | 5 refills | Status: AC | PRN

## 2017-01-10 NOTE — Telephone Encounter (Signed)
Richard [name] is call to get PT 2 x for 5 weeks and would like to know if the pt should still be taking the pantoprazole if so pt will also need a refill of the medication.  Pt need new Rx for ondansetron    PT not sure which pharmacy the medication should be going to and will call back with the correct pharmacy.

## 2017-01-10 NOTE — Telephone Encounter (Signed)
I went ahead and refilled the Pantoprazole and Ondansetron. As I review his hospital DC summary nothing is mentioned about needing PT. What is this for? If it is for the spinal fracture, these orders should come from Dr. Cyndy Freeze (the neurosurgeon who is following this)

## 2017-01-10 NOTE — Telephone Encounter (Signed)
FYI Patient cancelled appt for vertebroplasty; patient states that he spoke with Dr. Sharmaine Base who states he did not need this procedure.

## 2017-01-10 NOTE — Telephone Encounter (Signed)
Pharmacy is Paediatric nurse in chart

## 2017-01-11 ENCOUNTER — Telehealth: Payer: Self-pay

## 2017-01-11 NOTE — Telephone Encounter (Signed)
Threasa Beards called back regarding PT orders. She said Dr. Cyndy Freeze did not place orders for PT, she is not sure who initiated orders. She is asking for approval for PT to do strengthening and mobility exercises. She reports his back is doing well and after wearing brace for several days he is no longer in pain. She is asking for PT orders for overall strengthening.   Dr. Sarajane Jews - Please advise. Thanks!

## 2017-01-11 NOTE — Telephone Encounter (Signed)
I am not comfortable with giving these orders when he is recovering from a spinal fracture. Either have Dr. Cyndy Freeze approve these orders or else what until Brodin sees me again

## 2017-01-11 NOTE — Telephone Encounter (Signed)
D/C 01/10/15 To: home  Spoke with pt's wife and she states pt is doing well. His pain has greatly improved. He is wearing his brace daily as directed. He is able to complete most ADL's on his own with very minimal assistance. They have no questions or concerns at this time.    Appt scheduled with Dr. Sarajane Jews 01/17/17, pt aware.    Transition Care Management Follow-up Telephone Call  How have you been since you were released from the hospital? Improving slowly, still very weak   Do you understand why you were in the hospital? yes   Do you understand the discharge instrcutions? yes  Items Reviewed:  Medications reviewed: yes  Allergies reviewed: yes  Dietary changes reviewed: yes  Referrals reviewed: yes   Functional Questionnaire:   Activities of Daily Living (ADLs):   He states they are independent in the following: ambulation, bathing and hygiene, feeding, continence, grooming, toileting and dressing States they require assistance with the following: none   Any transportation issues/concerns?: no   Any patient concerns? no   Confirmed importance and date/time of follow-up visits scheduled: yes   Confirmed with patient if condition begins to worsen call PCP or go to the ER.  Patient was given the Call-a-Nurse line [phone]: yes

## 2017-01-11 NOTE — Telephone Encounter (Signed)
Spoke with Threasa Beards and advised. She will contact Dr. Hewitt Shorts office. Nothing further needed at this time.

## 2017-01-12 ENCOUNTER — Other Ambulatory Visit: Payer: Self-pay | Admitting: Family Medicine

## 2017-01-17 ENCOUNTER — Encounter: Payer: Self-pay | Admitting: Family Medicine

## 2017-01-17 ENCOUNTER — Ambulatory Visit (INDEPENDENT_AMBULATORY_CARE_PROVIDER_SITE_OTHER): Payer: Medicare Other | Admitting: Family Medicine

## 2017-01-17 VITALS — BP 126/81 | HR 87 | Temp 98.8°F

## 2017-01-17 DIAGNOSIS — A419 Sepsis, unspecified organism: Secondary | ICD-10-CM | POA: Diagnosis not present

## 2017-01-17 DIAGNOSIS — J181 Lobar pneumonia, unspecified organism: Secondary | ICD-10-CM

## 2017-01-17 DIAGNOSIS — I10 Essential (primary) hypertension: Secondary | ICD-10-CM | POA: Diagnosis not present

## 2017-01-17 DIAGNOSIS — J9610 Chronic respiratory failure, unspecified whether with hypoxia or hypercapnia: Secondary | ICD-10-CM

## 2017-01-17 DIAGNOSIS — N184 Chronic kidney disease, stage 4 (severe): Secondary | ICD-10-CM

## 2017-01-17 DIAGNOSIS — D638 Anemia in other chronic diseases classified elsewhere: Secondary | ICD-10-CM | POA: Diagnosis not present

## 2017-01-17 DIAGNOSIS — M4854XD Collapsed vertebra, not elsewhere classified, thoracic region, subsequent encounter for fracture with routine healing: Secondary | ICD-10-CM

## 2017-01-17 LAB — CBC WITH DIFFERENTIAL/PLATELET
BASOS ABS: 0 10*3/uL (ref 0.0–0.1)
Basophils Relative: 0.3 % (ref 0.0–3.0)
Eosinophils Absolute: 0 10*3/uL (ref 0.0–0.7)
Eosinophils Relative: 0.2 % (ref 0.0–5.0)
HCT: 32 % — ABNORMAL LOW (ref 39.0–52.0)
Hemoglobin: 10.7 g/dL — ABNORMAL LOW (ref 13.0–17.0)
LYMPHS ABS: 0.4 10*3/uL — AB (ref 0.7–4.0)
Lymphocytes Relative: 4.1 % — ABNORMAL LOW (ref 12.0–46.0)
MCHC: 33.5 g/dL (ref 30.0–36.0)
MCV: 101.1 fl — ABNORMAL HIGH (ref 78.0–100.0)
MONO ABS: 0.5 10*3/uL (ref 0.1–1.0)
Monocytes Relative: 5.6 % (ref 3.0–12.0)
NEUTROS ABS: 8.4 10*3/uL — AB (ref 1.4–7.7)
NEUTROS PCT: 89.8 % — AB (ref 43.0–77.0)
PLATELETS: 185 10*3/uL (ref 150.0–400.0)
RBC: 3.17 Mil/uL — AB (ref 4.22–5.81)
RDW: 19.2 % — ABNORMAL HIGH (ref 11.5–15.5)
WBC: 9.4 10*3/uL (ref 4.0–10.5)

## 2017-01-17 LAB — BASIC METABOLIC PANEL
BUN: 23 mg/dL (ref 6–23)
CALCIUM: 8.8 mg/dL (ref 8.4–10.5)
CO2: 32 meq/L (ref 19–32)
Chloride: 101 mEq/L (ref 96–112)
Creatinine, Ser: 1.4 mg/dL (ref 0.40–1.50)
GFR: 52.02 mL/min — AB (ref >60.00)
GLUCOSE: 105 mg/dL — AB (ref 70–99)
Potassium: 4.5 mEq/L (ref 3.5–5.1)
Sodium: 142 mEq/L (ref 135–145)

## 2017-01-17 MED ORDER — CEFUROXIME AXETIL 500 MG PO TABS: 500.0000 mg | ORAL_TABLET | Freq: Two times a day (BID) | ORAL | 0 refills | Status: DC

## 2017-01-17 NOTE — Progress Notes (Addendum)
   Subjective:    Patient ID: Richard [name], male    DOB: 03-26-1936, 81 y.o.   MRN: [id number]  HPI Here for a transitional care follow up for a hospital stay from 01-05-17 to 01-09-17 for sepsis originating from a pneumonia. I reviewed these notes and test results with him today. He presented with fever, a productive cough, and SOB. He was treated with antibiotics and improved quickly. The admission CXR showed an infiltrate in the left middle lung zone. Cultures could not identify a specific bacteria. He has also been dealing with back pain from a T7 compression fracture and he is wearing a back brace. After he got home he took Ceftin for 3 days and he was feeling much better. Now however over the past few days he has felt a little worse again. He still has a dry cough, no fever.   Review of Systems  Constitutional: Negative.   Respiratory: Positive for cough, chest tightness and shortness of breath. Negative for wheezing.   Cardiovascular: Negative.   Musculoskeletal: Positive for back pain.  Neurological: Negative.        Objective:   Physical Exam  Constitutional: He is oriented to person, place, and time.  Frail but he has had good color . Wearing a back brace.   Cardiovascular: Normal rate, regular rhythm, normal heart sounds and intact distal pulses.   Pulmonary/Chest: Effort normal. No respiratory distress. He has no wheezes.  Soft rales are heard at the left base   Neurological: He is alert and oriented to person, place, and time.          Assessment & Plan:  He is recovering from sepsis and a lobar pneumonia, but I think the pneumonia has not totally cleared yet. I will give him another 10 days of Ceftin 500 mg bid. I encouraged him to use his incentive spirometer 4 times every day. Get a BMET and CBC today. He will follow up with Dr. Cyndy Freeze about the compression fracture. We spent 45 minutes face to face discussing these issues and test results and counseling about future  care.  Alysia Penna, MD

## 2017-01-17 NOTE — Patient Instructions (Signed)
WE NOW OFFER   [facility] FAST TRACK!!!  SAME DAY Appointments for ACUTE CARE  Such as: Sprains, Injuries, cuts, abrasions, rashes, muscle pain, joint pain, back pain Colds, flu, sore throats, headache, allergies, cough, fever  Ear pain, sinus and eye infections Abdominal pain, nausea, vomiting, diarrhea, upset stomach Animal/insect bites  3 Easy Ways to Schedule: Walk-In Scheduling Call in scheduling Mychart Sign-up: [email or web address]         

## 2017-01-18 ENCOUNTER — Telehealth: Payer: Self-pay | Admitting: Family Medicine

## 2017-01-18 NOTE — Telephone Encounter (Signed)
Richard [name] with [facility] would like verbal for home health PT 2 wk /4  For strengthening and endurance.

## 2017-01-18 NOTE — Telephone Encounter (Signed)
Please call in this order  

## 2017-01-19 IMAGING — DX DG CHEST 2V
2 series · 2 of 2 positions shown · non-contrast
Comparison: 02/29/2016

CLINICAL DATA: Bronchiectasis.  Cough.

EXAM:
CHEST  2 VIEW

[chest pa]
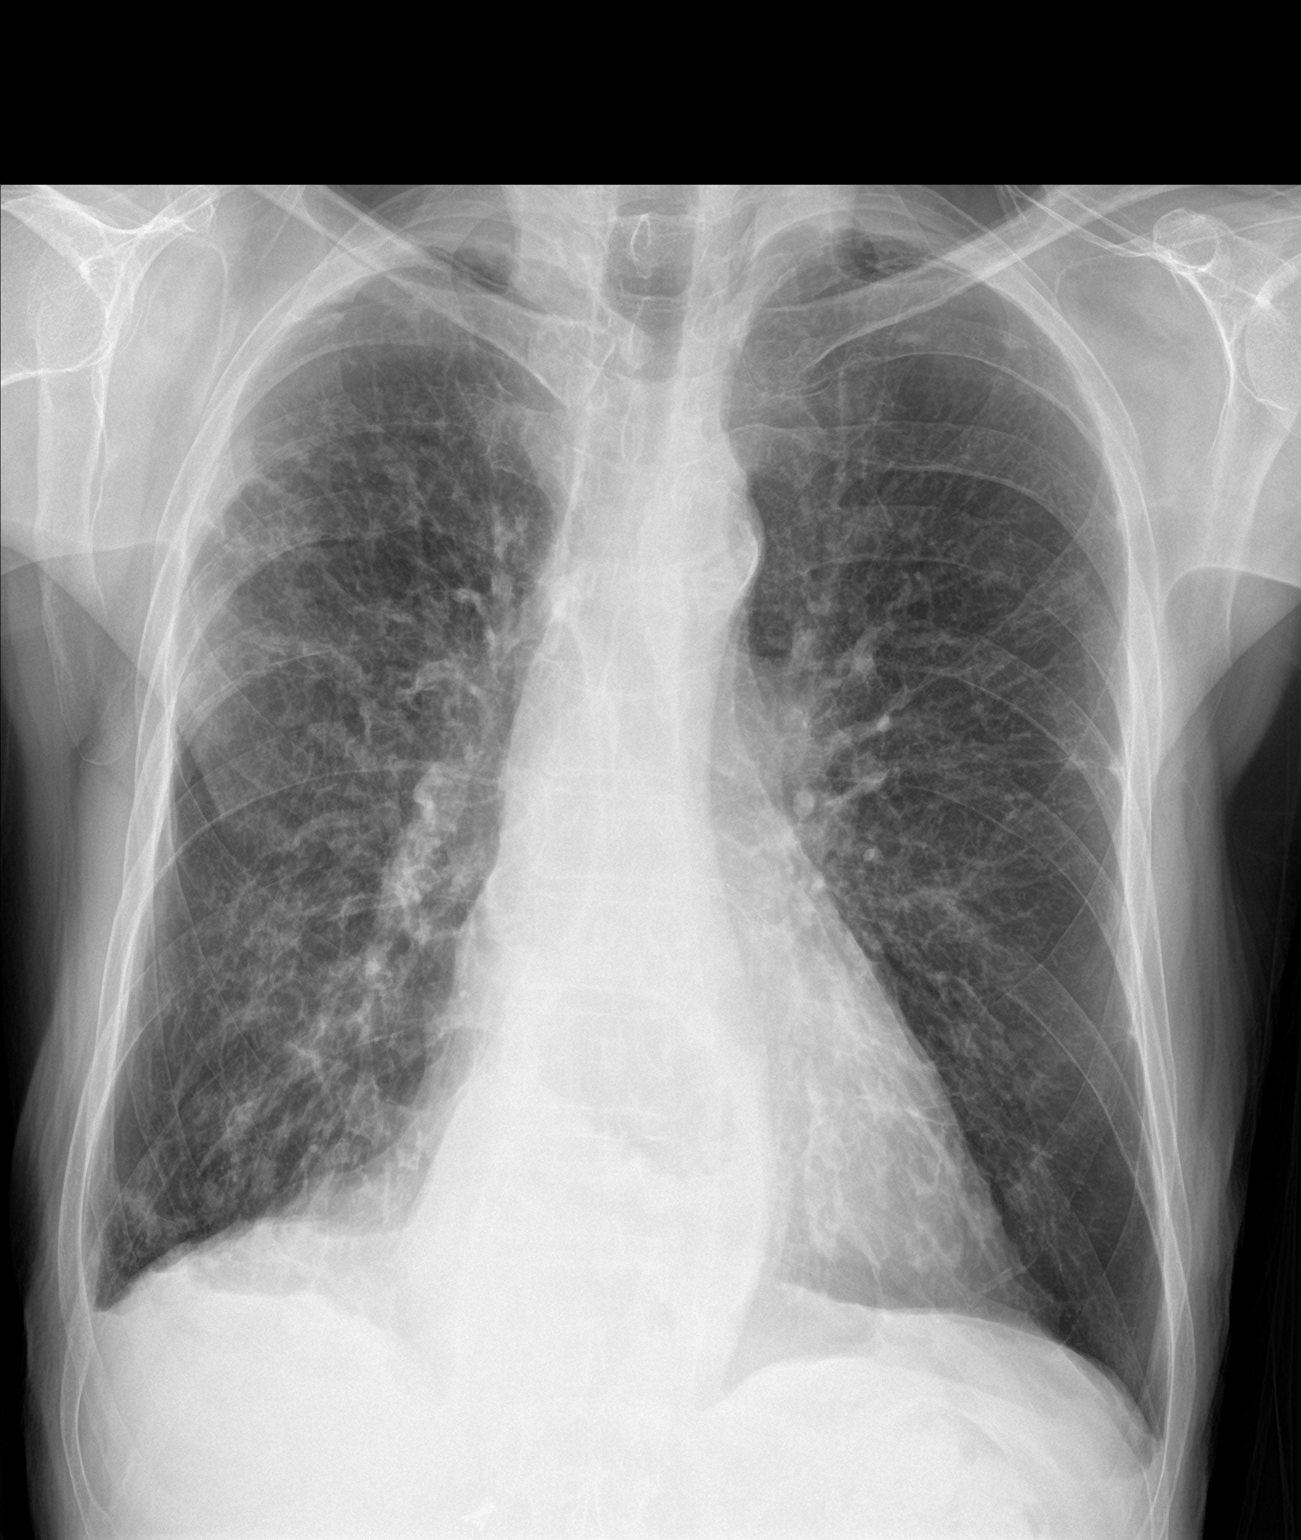

[chest lat]
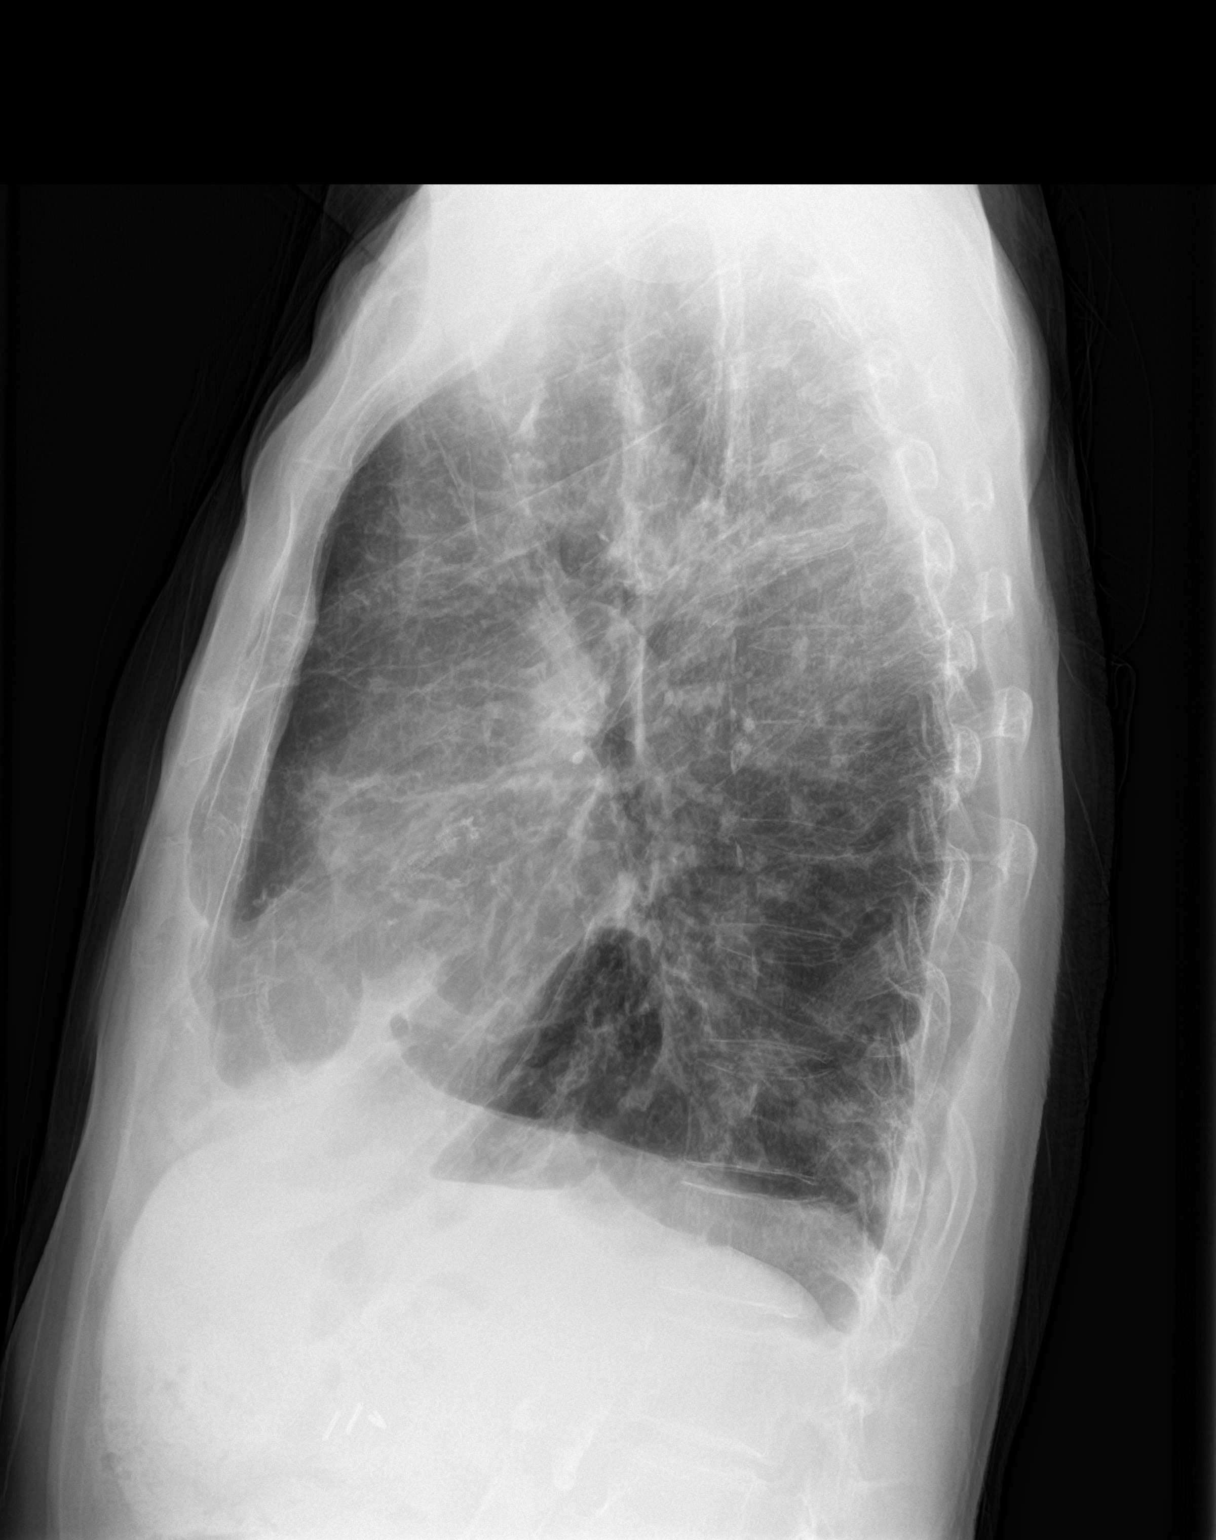

[2 of 2 positions shown; findings below may reference images not displayed]

FINDINGS: Chronic reticular opacities are again seen throughout both lungs.
There is nodularity towards the lung bases likely representing fluid
and mucous material contained within an known bronchiectasis. Large
hiatal hernia is stable. Normal heart size. No pneumothorax. No
pleural effusion.
IMPRESSION: Chronic inflammatory changes related to bronchiectasis are stable.

## 2017-01-19 NOTE — Telephone Encounter (Signed)
I spoke with Melody and gave verbal okay for PT.

## 2017-01-25 ENCOUNTER — Other Ambulatory Visit: Payer: Self-pay | Admitting: Internal Medicine

## 2017-01-26 NOTE — Telephone Encounter (Signed)
Rx(s) sent to pharmacy electronically.  

## 2017-01-27 ENCOUNTER — Encounter (HOSPITAL_COMMUNITY): Payer: Self-pay | Admitting: *Deleted

## 2017-01-27 ENCOUNTER — Emergency Department (HOSPITAL_COMMUNITY)
Admission: EM | Admit: 2017-01-27 | Discharge: 2017-01-27 | Disposition: A | Discharge status: Home / Self Care | Payer: Medicare Other | Attending: Emergency Medicine | Admitting: Emergency Medicine

## 2017-01-27 ENCOUNTER — Emergency Department (HOSPITAL_COMMUNITY): Payer: Medicare Other

## 2017-01-27 DIAGNOSIS — R531 Weakness: Secondary | ICD-10-CM

## 2017-01-27 DIAGNOSIS — J449 Chronic obstructive pulmonary disease, unspecified: Secondary | ICD-10-CM | POA: Insufficient documentation

## 2017-01-27 DIAGNOSIS — N184 Chronic kidney disease, stage 4 (severe): Secondary | ICD-10-CM | POA: Insufficient documentation

## 2017-01-27 DIAGNOSIS — E860 Dehydration: Secondary | ICD-10-CM | POA: Diagnosis not present

## 2017-01-27 DIAGNOSIS — J181 Lobar pneumonia, unspecified organism: Secondary | ICD-10-CM | POA: Diagnosis not present

## 2017-01-27 DIAGNOSIS — Z7982 Long term (current) use of aspirin: Secondary | ICD-10-CM | POA: Insufficient documentation

## 2017-01-27 DIAGNOSIS — I129 Hypertensive chronic kidney disease with stage 1 through stage 4 chronic kidney disease, or unspecified chronic kidney disease: Secondary | ICD-10-CM | POA: Diagnosis not present

## 2017-01-27 DIAGNOSIS — E039 Hypothyroidism, unspecified: Secondary | ICD-10-CM | POA: Insufficient documentation

## 2017-01-27 DIAGNOSIS — I2510 Atherosclerotic heart disease of native coronary artery without angina pectoris: Secondary | ICD-10-CM | POA: Diagnosis not present

## 2017-01-27 LAB — CBC
HCT: 29.8 % — ABNORMAL LOW (ref 39.0–52.0)
HEMOGLOBIN: 9.7 g/dL — AB (ref 13.0–17.0)
MCH: 33.7 pg (ref 26.0–34.0)
MCHC: 32.6 g/dL (ref 30.0–36.0)
MCV: 103.5 fL — ABNORMAL HIGH (ref 78.0–100.0)
Platelets: 202 10*3/uL (ref 150–400)
RBC: 2.88 MIL/uL — ABNORMAL LOW (ref 4.22–5.81)
RDW: 17.8 % — ABNORMAL HIGH (ref 11.5–15.5)
WBC: 7.8 10*3/uL (ref 4.0–10.5)

## 2017-01-27 LAB — URINALYSIS, ROUTINE W REFLEX MICROSCOPIC
BILIRUBIN URINE: NEGATIVE
Glucose, UA: NEGATIVE mg/dL
Hgb urine dipstick: NEGATIVE
Ketones, ur: NEGATIVE mg/dL
Leukocytes, UA: NEGATIVE
NITRITE: NEGATIVE
Protein, ur: NEGATIVE mg/dL
SPECIFIC GRAVITY, URINE: 1.013 (ref 1.005–1.030)
pH: 8 (ref 5.0–8.0)

## 2017-01-27 LAB — BASIC METABOLIC PANEL
ANION GAP: 8 (ref 5–15)
BUN: 27 mg/dL — ABNORMAL HIGH (ref 6–20)
CALCIUM: 9.2 mg/dL (ref 8.9–10.3)
CO2: 28 mmol/L (ref 22–32)
Chloride: 106 mmol/L (ref 101–111)
Creatinine, Ser: 1.65 mg/dL — ABNORMAL HIGH (ref 0.61–1.24)
GFR, EST AFRICAN AMERICAN: 44 mL/min — AB (ref >60)
GFR, EST NON AFRICAN AMERICAN: 38 mL/min — AB (ref >60)
Glucose, Bld: 123 mg/dL — ABNORMAL HIGH (ref 65–99)
Potassium: 5.1 mmol/L (ref 3.5–5.1)
Sodium: 142 mmol/L (ref 135–145)

## 2017-01-27 MED ORDER — SODIUM CHLORIDE 0.9 % IV BOLUS (SEPSIS)
500.0000 mL | Freq: Once | INTRAVENOUS | Status: AC
  Administered 2017-01-27: 500 mL via INTRAVENOUS

## 2017-01-27 MED ORDER — ALBUTEROL SULFATE (2.5 MG/3ML) 0.083% IN NEBU
5.0000 mg | INHALATION_SOLUTION | Freq: Once | RESPIRATORY_TRACT | Status: AC
  Administered 2017-01-27: 5 mg via RESPIRATORY_TRACT
  Filled 2017-01-27: qty 6

## 2017-01-27 NOTE — ED Provider Notes (Signed)
[facility] DEPT Provider Note   CSN: [id number] Arrival date & time: 01/27/17  1330     History   Chief Complaint Chief Complaint  Patient presents with  . Shortness of Breath  . Weakness    HPI Richard [name] is a 81 y.o. male.  Patient is a 81 year old male with a history of hypertension, COPD, coronary artery disease and recent pneumonia who presents with generalized weakness. He was admitted on June 4 for community acquired pneumonia. He was discharged on Ceftin. He followed up with his PCP on June 12 and was continued with an extended course of Ceftin which she finished today. He states he's had some generalized weakness throughout the course but felt weaker today. He had a hard time ambulating. He has a little bit more shortness of breath that he normally does. He uses inhalers at home. He denies any fevers. He is not oxygen dependent. He denies any nausea or vomiting. No urinary symptoms. No diarrhea. He does have a history of chronic anemia as well and was treated with a blood transfusion during this most recent hospitalization. He does have some pain to his left chest with coughing. This is been going on since the hospitalization.      Past Medical History:  Diagnosis Date  . Adenomatous colon polyp   . Anemia   . Anemia   . Asthma   . BPH (benign prostatic hyperplasia)    sees Dr. Risa Grill, biopsy June 2015 was benign   . Bronchiectasis ([place])   . CAD (coronary artery disease)    a. [place] s/p [place] x3 and ultimately BMS to pLAD (vision BMS 3.0 x 18), POBA to CTO of Cx-OM 03/17/14 and staged Rotablator-PCI w/ BMS (Mini Vision 2.5 x 28) to mRCA on 03/21/14  . Chronic bronchitis ([place])   . COPD (chronic obstructive pulmonary disease) ([place])   . Diverticulosis   . Femoral artery pseudo-aneurysm, right ([place])    a. s/p repair. Follow up with Dr. Kellie Simmering   . Fungal infection    lungs  . GERD (gastroesophageal reflux disease)   . Hiatal hernia   . History of blood transfusion  07/2016; 11/01/2016   "low blood; low blood"  . History of kidney stones   . HOH (hard of hearing)    bilaterally  . HTN (hypertension) 09/28/2013  . Moderate aortic stenosis by prior echocardiogram 07/08/2016   Echo 10/2015: Now Moderate AD (AVA ~1.2 cm) EF 60-65%. GR 1 DD. Echo 11/03/16:EF 60-65% with moderate LVH. Only noted grade 1 diastolic dysfunction. Moderate AS with moderate calcification and thickening. (AVA ~1.2-1.3 cm)  . Peptic stricture of esophagus   . Pneumonia    "1-2 times" (11/01/2016)  . Wegener's granulomatosis ([place])    sees Dr. [name] ; "went from my lungs to my kidneys" (11/01/2016)    Patient Active Problem List   Diagnosis Date Noted  . Anemia, chronic disease   . Dehydration   . HCAP (healthcare-associated pneumonia)   . Sepsis ([place]) 01/05/2017  . Lobar pneumonia ([place]) 01/05/2017  . Compression fracture of thoracic spine, non-traumatic ([place]) 01/05/2017  . AKI (acute kidney injury) ([place]) 01/05/2017  . Hypotension 01/05/2017  . CAD in native artery 11/14/2016  . Melena 11/01/2016  . Malnutrition of moderate degree 08/22/2016  . Influenza A 08/21/2016  . AVM (arteriovenous malformation) of colon with hemorrhage   . Symptomatic anemia 07/30/2016  . Gastrointestinal hemorrhage with melena 07/30/2016  . Aortic valve stenosis 07/08/2016  . Protein-calorie malnutrition, severe ([place])  04/21/2015  . CAP (community acquired pneumonia) 04/18/2015  . Chronic kidney disease, stage IV (severe) ([place]) 03/26/2015  . GERD (gastroesophageal reflux disease) 08/20/2014  . Wegener's granulomatosis with renal involvement ([place]) 07/15/2014  . Pseudoaneurysm of right femoral artery ([place]) 04/08/2014  . Acute blood loss anemia 03/20/2014  . CAD- severe 4 V CAD- not CABG candidate: BMS-pLAD, mRCA &PTCA of 100% Cx-OM 03/17/2014  . Anemia 03/17/2014  . CKD (chronic kidney disease), stage III 03/17/2014  . Cardiomyopathy, ischemic- EF 40% by [facility] 03/17/2014  . Dyspnea 03/13/2014  .  Abnormal nuclear stress test: Intermediate risk with moderate region of apical and inferior scar with mild superimposed ischemia; EF 40% 03/12/2014  . Loss of weight 12/12/2013  . Abdominal pain, unspecified site 12/12/2013  . SVT (supraventricular tachycardia) ([place]) 10/04/2013  . SOB (shortness of breath) 10/04/2013  . Weakness generalized 10/04/2013  . Hypertension 09/28/2013  . Chronic rhinitis 09/27/2013  . Chronic respiratory failure ([place]) 12/26/2012  . Bronchiectasis with acute exacerbation ([place]) 09/28/2011  . OTHER DYSPHAGIA 10/25/2010  . Wegener's granulomatosis ([place]) 05/10/2010  . GERD 03/10/2010  . ARTHRITIS, CERVICAL SPINE 03/10/2010  . Hypothyroidism 11/13/2008  . VITAMIN D DEFICIENCY 11/13/2008  . Hyperlipidemia with target low density lipoprotein (LDL) cholesterol less than 70 mg/dL 11/13/2008  . ANEMIA 11/13/2008  . BPH (benign prostatic hyperplasia) 12/04/2007  . Obstructive bronchiectasis ([place]) 01/25/2007    Past Surgical History:  Procedure Laterality Date  . CARDIAC CATHETERIZATION  03/17/2014   Procedure: CORONARY BALLOON ANGIOPLASTY;  Surgeon: Troy Sine, MD;  Location: [facility] CATH LAB;  Service: Cardiovascular;;  . CATARACT EXTRACTION W/ INTRAOCULAR LENS  IMPLANT, BILATERAL  12/18/2012  . CHOLECYSTECTOMY N/A 12/21/2012   Procedure: LAPAROSCOPIC CHOLECYSTECTOMY WITH INTRAOPERATIVE CHOLANGIOGRAM;  Surgeon: Edward Jolly, MD;  Location: [facility] ORS;  Service: General;  Laterality: N/A;  . COLONOSCOPY  08-16-11   per Dr. Fuller Plan, diverticulosis and polyps, repeat in 5 yrs   . COLONOSCOPY N/A 08/01/2016   Procedure: COLONOSCOPY;  Surgeon: Carol Ada, MD;  Location: [facility] ENDOSCOPY;  Service: Endoscopy;  Laterality: N/A;  . ESOPHAGOGASTRODUODENOSCOPY N/A 07/31/2016   Procedure: ESOPHAGOGASTRODUODENOSCOPY (EGD);  Surgeon: Carol Ada, MD;  Location: [facility] ENDOSCOPY;  Service: Endoscopy;  Laterality: N/A;  . ESOPHAGOGASTRODUODENOSCOPY (EGD) WITH ESOPHAGEAL DILATION  11-29-10     per Dr. Fuller Plan   . HEMATOMA EVACUATION Right 03/19/2014   Procedure: Suture repair of femoral artery with evacuation of hematoma;  Surgeon: Mal Misty, MD;  Location: [facility];  Service: Vascular;  Laterality: Right;  . INGUINAL HERNIA REPAIR Right ~ 2002  . LEFT HEART CATHETERIZATION WITH CORONARY ANGIOGRAM N/A 03/14/2014   Procedure: LEFT HEART CATHETERIZATION WITH CORONARY ANGIOGRAM;  Surgeon: Leonie Man, MD;  Location: [facility] CATH LAB: pLAD 90% calcified lesion --> after D1, LAD is Subtotally occluded. mCx 100% (L-L collaterals). pRI 60-80%. mRCA diffuse 80-95%.  Marland Kitchen LUNG BIOPSY  03/2014  . PERCUTANEOUS CORONARY STENT INTERVENTION (PCI-S) N/A 03/17/2014   Procedure: PERCUTANEOUS CORONARY STENT INTERVENTION (PCI-S);  Surgeon: Troy Sine, MD;  Location: [facility] CATH LAB: Vision BMS 3.0 mm x 18 mm -- 3.2 mm); PTCA of CTO Cx-OM  . PERCUTANEOUS CORONARY STENT INTERVENTION (PCI-S) N/A 03/21/2014   Procedure: PERCUTANEOUS CORONARY STENT INTERVENTION (PCI-S);  Surgeon: Burnell Blanks, MD;  Location: [facility] CATH LAB:  Rotational Atherectomy -- Mini Trek BMS 2.5 x 28 mm --> 2.75 mm)  . RENAL BIOPSY    . TRANSTHORACIC ECHOCARDIOGRAM  10/2015; 10/2016   a. Now Moderate AD (AVA ~1.2  cm) EF 60-65%. GR 1 DD.;; b. Stable -- EF 60-65% with moderate LVH. Only noted grade 1 diastolic dysfunction. Moderate aortic stenosis with moderate calcification and thickening. (Estimated valve area between 1.2-1.3 cm)       Home Medications    Prior to Admission medications   Medication Sig Start Date End Date Taking? Authorizing Provider  acetaminophen (TYLENOL) 500 MG tablet Take 2 tablets (1,000 mg total) by mouth every 8 (eight) hours. Patient taking differently: Take 1,000 mg by mouth every 8 (eight) hours as needed for mild pain or moderate pain.  01/09/17  Yes Eugenie Filler, MD  albuterol (VENTOLIN HFA) 108 (90 Base) MCG/ACT inhaler Inhale 1-2 puffs into the lungs every 4 (four) hours as needed for wheezing  or shortness of breath. 07/12/16  Yes Tanda Rockers, MD  aspirin EC 81 MG tablet Take 81 mg by mouth daily.   Yes [provider]  b complex vitamins tablet Take 1 tablet by mouth daily.     Yes [provider]  benzonatate (TESSALON) 200 MG capsule Take 1 capsule (200 mg total) by mouth 3 (three) times daily as needed for cough. 01/09/17  Yes Eugenie Filler, MD  bisacodyl (DULCOLAX) 5 MG EC tablet Take 5 mg by mouth 2 (two) times daily.    Yes [provider]  calcitRIOL (ROCALTROL) 0.25 MCG capsule Take 0.25 mcg by mouth daily.  09/06/15  Yes [provider]  cefUROXime (CEFTIN) 500 MG tablet Take 1 tablet (500 mg total) by mouth 2 (two) times daily with a meal. 01/17/17  Yes [name], MD  cyclophosphamide (CYTOXAN) 25 MG capsule Take 25 mg by mouth daily. Take one capsule by mouth daily on an empty stomach 1 hour before or 2 hours after meal. 07/26/16  Yes [provider]  dextromethorphan (DELSYM) 30 MG/5ML liquid Take 30 mg by mouth at bedtime as needed for cough.   Yes [provider]  finasteride (PROSCAR) 5 MG tablet Take 1 tablet (5 mg total) by mouth daily. 11/18/16  Yes [name], MD  ipratropium-albuterol (DUONEB) 0.5-2.5 (3) MG/3ML SOLN Take 3 mLs by nebulization every 4 (four) hours as needed (if inhaler is not effective in resolving shortness of breath or wheezing). 02/03/16  Yes Tanda Rockers, MD  loratadine (CLARITIN) 10 MG tablet Take 10 mg by mouth daily.    Yes [provider]  Methylcellulose, Laxative, (CITRUCEL PO) Take 1 packet by mouth daily.   Yes [provider]  metoprolol succinate (TOPROL-XL) 25 MG 24 hr tablet TAKE ONE TABLET BY MOUTH ONCE DAILY 01/26/17  Yes Hilty, Nadean Corwin, MD  nitroGLYCERIN (NITROSTAT) 0.4 MG SL tablet Place 1 tablet (0.4 mg total) under the tongue every 5 (five) minutes x 3 doses as needed for chest pain. 03/24/15  Yes Hilty, Nadean Corwin, MD  ondansetron (ZOFRAN) 4 MG  tablet Take 1 tablet (4 mg total) by mouth every 8 (eight) hours as needed for nausea or vomiting. 01/10/17  Yes [name], MD  oxyCODONE-acetaminophen (PERCOCET) 10-325 MG tablet Take 1 tablet by mouth every 6 (six) hours as needed for pain. 12/22/16  Yes [name], MD  predniSONE (DELTASONE) 5 MG tablet Take 7.5 mg by mouth daily with breakfast.   Yes [provider]  ranitidine (ZANTAC) 300 MG tablet Take 1 tablet (300 mg total) by mouth 2 (two) times daily. 11/18/16  Yes [name], MD  simethicone (MYLICON) 694 MG chewable tablet Sarina Ser  250 mg by mouth 2 (two) times daily as needed for flatulence. TAKE PER BOX FOR GAS    Yes [provider]  simvastatin (ZOCOR) 20 MG tablet TAKE ONE TABLET BY MOUTH ONCE DAILY AT  6  PM 07/07/16  Yes Hilty, Nadean Corwin, MD  sodium bicarbonate 650 MG tablet Take 2 tablets (1,300 mg total) by mouth 2 (two) times daily. NEED OV. 08/12/15  Yes Hilty, Nadean Corwin, MD  sodium chloride (OCEAN) 0.65 % SOLN nasal spray Place 1 spray into both nostrils as needed for congestion.   Yes [provider]  SYMBICORT 160-4.5 MCG/ACT inhaler INHALE TWO PUFFS BY MOUTH TWICE DAILY 06/01/16  Yes Tanda Rockers, MD  tamsulosin (FLOMAX) 0.4 MG CAPS capsule TAKE ONE CAPSULE BY MOUTH ONCE DAILY IN THE EVENING 01/13/17  Yes [name], MD  guaiFENesin-codeine 100-10 MG/5ML syrup Take 10 mLs by mouth every 6 (six) hours as needed for cough. Patient not taking: Reported on 01/27/2017 08/23/16   Eber Jones, MD    Family History Family History  Problem Relation Age of Onset  . Asthma Father   . Coronary artery disease Mother   . Coronary artery disease Brother   . Coronary artery disease Brother   . Colon cancer Neg Hx   . Stomach cancer Neg Hx     Social History Social History  Substance Use Topics  . Smoking status: Never Smoker  . Smokeless tobacco: Never Used  . Alcohol use No     Allergies   Patient has no known  allergies.   Review of Systems Review of Systems  Constitutional: Positive for fatigue. Negative for chills, diaphoresis and fever.  HENT: Negative for congestion, rhinorrhea and sneezing.   Eyes: Negative.   Respiratory: Positive for cough and shortness of breath. Negative for chest tightness.   Cardiovascular: Positive for chest pain (left side chest pain with coughing). Negative for leg swelling.  Gastrointestinal: Negative for abdominal pain, blood in stool, diarrhea, nausea and vomiting.  Genitourinary: Negative for difficulty urinating, flank pain, frequency and hematuria.  Musculoskeletal: Negative for arthralgias and back pain.  Skin: Negative for rash.  Neurological: Negative for dizziness, speech difficulty, weakness, numbness and headaches.     Physical Exam Updated Vital Signs BP (!) 151/75   Pulse (!) 102   Temp 98.2 F (36.8 C) (Oral)   Resp 20   SpO2 92%   Physical Exam  Constitutional: He is oriented to person, place, and time. He appears well-developed and well-nourished.  HENT:  Head: Normocephalic and atraumatic.  Eyes: Pupils are equal, round, and reactive to light.  Neck: Normal range of motion. Neck supple.  Cardiovascular: Normal rate, regular rhythm and normal heart sounds.   Pulmonary/Chest: Effort normal and breath sounds normal. No respiratory distress. He has no wheezes. He has no rales. He exhibits no tenderness.  Abdominal: Soft. Bowel sounds are normal. There is no tenderness. There is no rebound and no guarding.  Musculoskeletal: Normal range of motion. He exhibits no edema.  No edema or calf tenderness  Lymphadenopathy:    He has no cervical adenopathy.  Neurological: He is alert and oriented to person, place, and time.  Skin: Skin is warm and dry. No rash noted.  Psychiatric: He has a normal mood and affect.     ED Treatments / Results  Labs (all labs ordered are listed, but only abnormal results are displayed) Labs Reviewed  BASIC  METABOLIC PANEL - Abnormal; Notable for the following:  Result Value   Glucose, Bld 123 (*)    BUN 27 (*)    Creatinine, Ser 1.65 (*)    GFR calc non Af Amer 38 (*)    GFR calc Af Amer 44 (*)    All other components within normal limits  CBC - Abnormal; Notable for the following:    RBC 2.88 (*)    Hemoglobin 9.7 (*)    HCT 29.8 (*)    MCV 103.5 (*)    RDW 17.8 (*)    All other components within normal limits  URINALYSIS, ROUTINE W REFLEX MICROSCOPIC    EKG  EKG Interpretation  Date/Time:  Friday January 27 2017 14:03:52 EDT Ventricular Rate:  93 PR Interval:    QRS Duration: 87 QT Interval:  369 QTC Calculation: 459 R Axis:   37 Text Interpretation:  Sinus rhythm Anteroseptal infarct, age indeterminate Baseline wander in lead(s) V3 V6 since last tracing no significant change Confirmed by Malvin Johns [phone]) on 01/27/2017 7:03:03 PM       Radiology Dg Chest 2 View  Result Date: 01/27/2017 CLINICAL DATA:  Shortness of breath. Recent diagnosis of pneumonia. Cough for 10 days. COPD and bronchitis. EXAM: CHEST  2 VIEW COMPARISON:  Multiple prior radiographs, most recent 01/07/2017. FINDINGS: Apparent mid thoracic vertebral body height loss may be artifactual ; not identified on prior radiographs for CTs. Midline trachea. Normal heart size. Atherosclerosis in the transverse aorta. A moderate hiatal hernia. No pleural effusion or pneumothorax. Appearance of moderate-to-marked relatively diffuse interstitial thickening is grossly similar to the baseline radiograph of 08/21/2016. The right upper lobe consolidation described on the prior plain film has resolved. Probable right-sided nipple shadow. IMPRESSION: Resolution of right upper lobe airspace disease. Chronic moderate-to-marked pulmonary interstitial thickening is similar. When correlated with the 07/30/2016 CT, this is secondary to lower lobe predominant bronchiectasis and mucous plugging. This is likely related to chronic  atypical infection or (especially given hiatal hernia) aspiration. Probable right-sided nipple shadow. Repeat frontal radiograph with nipple markers could confirm. Aortic Atherosclerosis (ICD10-I70.0). Electronically Signed   By: Abigail Miyamoto M.D.   On: 01/27/2017 14:39    Procedures Procedures (including critical care time)  Medications Ordered in ED Medications  sodium chloride 0.9 % bolus 500 mL (0 mLs Intravenous Stopped 01/27/17 2137)  albuterol (PROVENTIL) (2.5 MG/3ML) 0.083% nebulizer solution 5 mg (5 mg Nebulization Given 01/27/17 1946)     Initial Impression / Assessment and Plan / ED Course  I have reviewed the triage vital signs and the nursing notes.  Pertinent labs & imaging results that were available during my care of the patient were reviewed by me and considered in my medical decision making (see chart for details).     Patient presents with generalized weakness after recent treatment for pneumonia. His chest x-ray is clear without suggestions of ongoing pneumonia. His labs are non-concerning. He was given IV fluids in the ED and is feeling better. He's able to ambulate without significant symptoms or hypoxia. He was discharged home in good condition. He was encouraged to continue fluid intake at home. He was encouraged to have close follow-up with his PCP. Return precautions were given. His hemoglobin has improved since his recent hospitalization.  Final Clinical Impressions(s) / ED Diagnoses   Final diagnoses:  Generalized weakness  Dehydration    New Prescriptions New Prescriptions   No medications on file     Malvin Johns, MD 01/27/17 2215

## 2017-01-27 NOTE — ED Triage Notes (Signed)
Pt reports being weak and states he is able to get around pretty good.  Today he is unable to ambulate as well because he gets SOB.  Pt was dx with pneumonia and has been on antibiotics for 10 days.  Pt does not feel like he is improving. Pt denies fevers or chills or pain.

## 2017-01-27 NOTE — ED Notes (Signed)
Pt is alert and oriented x 4 and is verbally responsive pt states that he was recently being treated for pneumonia and was on Abx therapy x 10 days. However pt states that he has not been feeling better with increased weakness and occasional rib pain with coughing has a productive cough with white to yellow phlegm. Pt is 98% on RA

## 2017-01-30 LAB — CBC AND DIFFERENTIAL
HCT: 26 — AB (ref 41–53)
HEMOGLOBIN: 8.8 — AB (ref 13.5–17.5)
NEUTROS ABS: 9
PLATELETS: 206 (ref 150–399)
WBC: 9.5

## 2017-01-30 LAB — HEPATIC FUNCTION PANEL
ALK PHOS: 97 (ref 25–125)
ALT: 21 (ref 10–40)
AST: 17 (ref 14–40)
Bilirubin, Total: 0.5

## 2017-01-30 LAB — BASIC METABOLIC PANEL
BUN: 25 — AB (ref 4–21)
CREATININE: 1.6 — AB (ref 0.6–1.3)
Glucose: 104
Potassium: 4.1 (ref 3.4–5.3)
Sodium: 138 (ref 137–147)

## 2017-02-01 ENCOUNTER — Encounter (HOSPITAL_COMMUNITY)
Admission: RE | Admit: 2017-02-01 | Discharge: 2017-02-01 | Disposition: A | Discharge status: Home / Self Care | Payer: Medicare Other | Source: Ambulatory Visit | Attending: Nephrology | Admitting: Nephrology

## 2017-02-01 DIAGNOSIS — N184 Chronic kidney disease, stage 4 (severe): Secondary | ICD-10-CM

## 2017-02-01 DIAGNOSIS — D631 Anemia in chronic kidney disease: Secondary | ICD-10-CM | POA: Insufficient documentation

## 2017-02-01 DIAGNOSIS — Z79899 Other long term (current) drug therapy: Secondary | ICD-10-CM | POA: Diagnosis not present

## 2017-02-01 DIAGNOSIS — Z5181 Encounter for therapeutic drug level monitoring: Secondary | ICD-10-CM | POA: Diagnosis not present

## 2017-02-01 DIAGNOSIS — N183 Chronic kidney disease, stage 3 (moderate): Secondary | ICD-10-CM | POA: Diagnosis present

## 2017-02-01 LAB — IRON AND TIBC
IRON: 112 ug/dL (ref 45–182)
Saturation Ratios: 54 % — ABNORMAL HIGH (ref 17.9–39.5)
TIBC: 207 ug/dL — ABNORMAL LOW (ref 250–450)
UIBC: 95 ug/dL

## 2017-02-01 LAB — POCT HEMOGLOBIN-HEMACUE: Hemoglobin: 8.3 g/dL — ABNORMAL LOW (ref 13.0–17.0)

## 2017-02-01 LAB — FERRITIN: Ferritin: 787 ng/mL — ABNORMAL HIGH (ref 24–336)

## 2017-02-01 MED ORDER — EPOETIN ALFA 10000 UNIT/ML IJ SOLN
INTRAMUSCULAR | Status: AC
  Administered 2017-02-01: 12:00:00 10000 [IU] via SUBCUTANEOUS
  Filled 2017-02-01: qty 1

## 2017-02-01 MED ORDER — EPOETIN ALFA 20000 UNIT/ML IJ SOLN
INTRAMUSCULAR | Status: AC
  Administered 2017-02-01: 12:00:00 20000 [IU] via SUBCUTANEOUS
  Filled 2017-02-01: qty 1

## 2017-02-01 MED ORDER — EPOETIN ALFA 40000 UNIT/ML IJ SOLN: 30000.0000 [IU] | INTRAMUSCULAR | Status: DC

## 2017-02-03 ENCOUNTER — Encounter: Payer: Self-pay | Admitting: Family Medicine

## 2017-02-06 ENCOUNTER — Encounter: Payer: Self-pay | Admitting: Family Medicine

## 2017-02-06 ENCOUNTER — Ambulatory Visit (INDEPENDENT_AMBULATORY_CARE_PROVIDER_SITE_OTHER): Payer: Medicare Other | Admitting: Family Medicine

## 2017-02-06 VITALS — BP 118/85 | HR 97 | Temp 98.5°F | Ht 67.0 in | Wt 103.0 lb

## 2017-02-06 DIAGNOSIS — J181 Lobar pneumonia, unspecified organism: Secondary | ICD-10-CM

## 2017-02-06 DIAGNOSIS — E860 Dehydration: Secondary | ICD-10-CM

## 2017-02-06 DIAGNOSIS — M3130 Wegener's granulomatosis without renal involvement: Secondary | ICD-10-CM

## 2017-02-06 DIAGNOSIS — J9611 Chronic respiratory failure with hypoxia: Secondary | ICD-10-CM

## 2017-02-06 DIAGNOSIS — R531 Weakness: Secondary | ICD-10-CM | POA: Diagnosis not present

## 2017-02-06 DIAGNOSIS — D638 Anemia in other chronic diseases classified elsewhere: Secondary | ICD-10-CM | POA: Diagnosis not present

## 2017-02-06 DIAGNOSIS — N184 Chronic kidney disease, stage 4 (severe): Secondary | ICD-10-CM

## 2017-02-06 DIAGNOSIS — I10 Essential (primary) hypertension: Secondary | ICD-10-CM

## 2017-02-06 DIAGNOSIS — E440 Moderate protein-calorie malnutrition: Secondary | ICD-10-CM | POA: Diagnosis not present

## 2017-02-06 DIAGNOSIS — R0602 Shortness of breath: Secondary | ICD-10-CM | POA: Diagnosis not present

## 2017-02-06 MED ORDER — HYDROCODONE-HOMATROPINE 5-1.5 MG/5ML PO SYRP: 5.0000 mL | ORAL_SOLUTION | ORAL | 0 refills | Status: AC | PRN

## 2017-02-06 NOTE — Patient Instructions (Signed)
WE NOW OFFER   [facility] FAST TRACK!!!  SAME DAY Appointments for ACUTE CARE  Such as: Sprains, Injuries, cuts, abrasions, rashes, muscle pain, joint pain, back pain Colds, flu, sore throats, headache, allergies, cough, fever  Ear pain, sinus and eye infections Abdominal pain, nausea, vomiting, diarrhea, upset stomach Animal/insect bites  3 Easy Ways to Schedule: Walk-In Scheduling Call in scheduling Mychart Sign-up: [email or web address]         

## 2017-02-06 NOTE — Progress Notes (Signed)
   Subjective:    Patient ID: Richard [name], male    DOB: 07/15/1936, 81 y.o.   MRN: [id number]  HPI Here to follow up an ER visit on 01-27-17 for generalized weakness. He had just finished treatment for a lobar pneumonia. His exam and labs were unremarkable and a CXR confirmed resolution of the pneumonia. He was given some IV fluids and was sent home. Since then he has felt better but is still weak. His appetite is poor. He is drinking fluids. No fever or SOB beyond his baseline. Of note he saw Dr. Jimmy Footman last week for a renal check,and he was started on a Prednisone taper. Instead of his usual 7.5 mg daily he was told to take 30 mg a day for one week, then go to 20 mg a day for one week, then go to 10 mg a day for one week, and then to go back to 7.5 mg a day. Since starting on this taper he has felt better than he has for weeks. He has more energy and his coughing and SOB have improved.    Review of Systems  Constitutional: Positive for fatigue. Negative for chills, diaphoresis and fever.  Respiratory: Positive for cough and shortness of breath. Negative for chest tightness and wheezing.   Cardiovascular: Negative.   Gastrointestinal: Negative.   Genitourinary: Negative.   Neurological: Negative.        Objective:   Physical Exam  Constitutional: He is oriented to person, place, and time.  Frail, appears weak   Cardiovascular: Normal rate, regular rhythm, normal heart sounds and intact distal pulses.   Pulmonary/Chest: Effort normal. No respiratory distress. He has no rales.  Diffuse wheezes and rhonchi   Musculoskeletal: He exhibits no edema.  Neurological: He is alert and oriented to person, place, and time.          Assessment & Plan:  His lobar pneumonia has resolved but he still has his underlying Wegeners granulomatosis with renal and pulmonary involvement. He seems to have responded well top the higher dose of Prednisone so I suggested he stay on his current dose  which is 20 mg a day for now. We will arrange for him to see Dr. [name] soon to see what thinks about this.  Alysia Penna, MD'

## 2017-02-07 ENCOUNTER — Encounter (HOSPITAL_COMMUNITY)
Admission: RE | Admit: 2017-02-07 | Discharge: 2017-02-07 | Disposition: A | Discharge status: Home / Self Care | Payer: Medicare Other | Source: Ambulatory Visit | Attending: Nephrology | Admitting: Nephrology

## 2017-02-07 DIAGNOSIS — Z5181 Encounter for therapeutic drug level monitoring: Secondary | ICD-10-CM | POA: Insufficient documentation

## 2017-02-07 DIAGNOSIS — D631 Anemia in chronic kidney disease: Secondary | ICD-10-CM | POA: Diagnosis not present

## 2017-02-07 DIAGNOSIS — N184 Chronic kidney disease, stage 4 (severe): Secondary | ICD-10-CM

## 2017-02-07 DIAGNOSIS — N183 Chronic kidney disease, stage 3 (moderate): Secondary | ICD-10-CM | POA: Diagnosis not present

## 2017-02-07 DIAGNOSIS — Z79899 Other long term (current) drug therapy: Secondary | ICD-10-CM | POA: Diagnosis not present

## 2017-02-07 LAB — POCT HEMOGLOBIN-HEMACUE: Hemoglobin: 9.5 g/dL — ABNORMAL LOW (ref 13.0–17.0)

## 2017-02-07 MED ORDER — EPOETIN ALFA 40000 UNIT/ML IJ SOLN: 30000.0000 [IU] | INTRAMUSCULAR | Status: DC

## 2017-02-07 MED ORDER — EPOETIN ALFA 10000 UNIT/ML IJ SOLN
INTRAMUSCULAR | Status: AC
  Administered 2017-02-07: 10000 [IU] via SUBCUTANEOUS
  Filled 2017-02-07: qty 1

## 2017-02-07 MED ORDER — EPOETIN ALFA 20000 UNIT/ML IJ SOLN
INTRAMUSCULAR | Status: AC
  Administered 2017-02-07: 14:00:00 20000 [IU] via SUBCUTANEOUS
  Filled 2017-02-07: qty 1

## 2017-02-14 ENCOUNTER — Encounter (HOSPITAL_COMMUNITY)
Admission: RE | Admit: 2017-02-14 | Discharge: 2017-02-14 | Disposition: A | Discharge status: Home / Self Care | Payer: Medicare Other | Source: Ambulatory Visit | Attending: Nephrology | Admitting: Nephrology

## 2017-02-14 DIAGNOSIS — N183 Chronic kidney disease, stage 3 (moderate): Secondary | ICD-10-CM | POA: Diagnosis not present

## 2017-02-14 DIAGNOSIS — N184 Chronic kidney disease, stage 4 (severe): Secondary | ICD-10-CM

## 2017-02-14 LAB — POCT HEMOGLOBIN-HEMACUE: HEMOGLOBIN: 8.9 g/dL — AB (ref 13.0–17.0)

## 2017-02-14 MED ORDER — EPOETIN ALFA 40000 UNIT/ML IJ SOLN: 30000.0000 [IU] | INTRAMUSCULAR | Status: DC

## 2017-02-14 MED ORDER — EPOETIN ALFA 10000 UNIT/ML IJ SOLN
INTRAMUSCULAR | Status: AC
  Administered 2017-02-14: 13:00:00 10000 [IU] via SUBCUTANEOUS
  Filled 2017-02-14: qty 1

## 2017-02-14 MED ORDER — EPOETIN ALFA 20000 UNIT/ML IJ SOLN
INTRAMUSCULAR | Status: AC
  Administered 2017-02-14: 20000 [IU] via SUBCUTANEOUS
  Filled 2017-02-14: qty 1

## 2017-02-15 ENCOUNTER — Telehealth: Payer: Self-pay | Admitting: Family Medicine

## 2017-02-15 ENCOUNTER — Telehealth: Payer: Self-pay | Admitting: Internal Medicine

## 2017-02-15 NOTE — Telephone Encounter (Signed)
I spoke with Tommie Raymond, pt's son. He is very concerned about dad, no energy, SOB. Has appointment with Dr [name] on 03/13/2017. What can be done in the meantime?

## 2017-02-15 NOTE — Telephone Encounter (Signed)
I agree that he should see someone in the Pulmonary clinic sooner than 03-13-17. Have them call and ask to be seen by the first available provider and not necessarily wait for Dr. [name]

## 2017-02-15 NOTE — Telephone Encounter (Signed)
Per 02/15/17 phone note where daughter Maudie Mercury call Dr Barbie Banner office, this was not necessarily an urgent referral but Dr Sarajane Jews does feel that pt needs to be seen sooner than his already scheduled appt with MW on 8.22.18 (NOTE: that appt was scheduled at as consult and pt is already established with Dr [name]).  However, TP just got a cancellation for this Friday 7.13.18 @ 0900 and pt may been seen at that time (appt slot has been held for pt).  Okay for 30 min slot.  LMOM TCB x1 for pt's daughter Maudie Mercury.

## 2017-02-15 NOTE — Telephone Encounter (Signed)
Daughter states they thought this was an urgent referral to Dr [name].  Dr [name] cannot see pt until 8/06.  Daughter states pt cannot walk down the hall without stopping, and the hall is not that long.  She states pt is not doing good at all.  Please advise

## 2017-02-15 NOTE — Telephone Encounter (Signed)
I spoke with wife Enid Derry and she is going to call office in the morning, will call our office back with update of upcoming appointment.

## 2017-02-16 NOTE — Telephone Encounter (Signed)
Patient daughter Maudie Mercury called back, offered her the appt with Tammy and pt daughter thinks that Dr. Sarajane Jews would want pt to see the NP - she can be reached at [phone] -pr

## 2017-02-16 NOTE — Telephone Encounter (Signed)
Called spoke with patient's daughter Maudie Mercury who expressed concern that patient would not be seeing Dr Thane Edu MW had a cancellation this coming Monday 7.16.18 @ [facility] happy with this appt date and time Appt scheduled Kim aware to contact the office for sooner follow up if needed Nothing further needed at this time; will sign off

## 2017-02-16 NOTE — Telephone Encounter (Signed)
I spoke with Richard [name], pt has appointment on Monday 02/20/2017 with Dr. [name] and Dr. Sarajane Jews is aware of this.

## 2017-02-17 ENCOUNTER — Emergency Department (HOSPITAL_COMMUNITY)
Admission: EM | Admit: 2017-02-17 | Discharge: 2017-02-17 | Disposition: A | Discharge status: Home / Self Care | Payer: Medicare Other | Attending: Emergency Medicine | Admitting: Emergency Medicine

## 2017-02-17 ENCOUNTER — Emergency Department (HOSPITAL_COMMUNITY): Payer: Medicare Other

## 2017-02-17 ENCOUNTER — Encounter (HOSPITAL_COMMUNITY): Payer: Self-pay | Admitting: Emergency Medicine

## 2017-02-17 DIAGNOSIS — R0609 Other forms of dyspnea: Secondary | ICD-10-CM | POA: Diagnosis not present

## 2017-02-17 DIAGNOSIS — R0602 Shortness of breath: Secondary | ICD-10-CM | POA: Diagnosis present

## 2017-02-17 DIAGNOSIS — I10 Essential (primary) hypertension: Secondary | ICD-10-CM | POA: Insufficient documentation

## 2017-02-17 DIAGNOSIS — I2510 Atherosclerotic heart disease of native coronary artery without angina pectoris: Secondary | ICD-10-CM | POA: Insufficient documentation

## 2017-02-17 DIAGNOSIS — Z79899 Other long term (current) drug therapy: Secondary | ICD-10-CM | POA: Insufficient documentation

## 2017-02-17 DIAGNOSIS — R05 Cough: Secondary | ICD-10-CM | POA: Diagnosis not present

## 2017-02-17 DIAGNOSIS — Z7982 Long term (current) use of aspirin: Secondary | ICD-10-CM | POA: Insufficient documentation

## 2017-02-17 DIAGNOSIS — J449 Chronic obstructive pulmonary disease, unspecified: Secondary | ICD-10-CM | POA: Insufficient documentation

## 2017-02-17 LAB — CBC WITH DIFFERENTIAL/PLATELET
BASOS PCT: 0 %
Basophils Absolute: 0 10*3/uL (ref 0.0–0.1)
EOS ABS: 0 10*3/uL (ref 0.0–0.7)
Eosinophils Relative: 0 %
HEMATOCRIT: 30.9 % — AB (ref 39.0–52.0)
Hemoglobin: 10 g/dL — ABNORMAL LOW (ref 13.0–17.0)
LYMPHS ABS: 0.2 10*3/uL — AB (ref 0.7–4.0)
Lymphocytes Relative: 2 %
MCH: 35.8 pg — AB (ref 26.0–34.0)
MCHC: 32.4 g/dL (ref 30.0–36.0)
MCV: 110.8 fL — ABNORMAL HIGH (ref 78.0–100.0)
MONOS PCT: 3 %
Monocytes Absolute: 0.3 10*3/uL (ref 0.1–1.0)
NEUTROS ABS: 9.1 10*3/uL — AB (ref 1.7–7.7)
NEUTROS PCT: 95 %
Platelets: 199 10*3/uL (ref 150–400)
RBC: 2.79 MIL/uL — AB (ref 4.22–5.81)
RDW: 19.2 % — ABNORMAL HIGH (ref 11.5–15.5)
WBC: 9.6 10*3/uL (ref 4.0–10.5)

## 2017-02-17 LAB — URINALYSIS, ROUTINE W REFLEX MICROSCOPIC
Bilirubin Urine: NEGATIVE
Glucose, UA: NEGATIVE mg/dL
Hgb urine dipstick: NEGATIVE
Ketones, ur: NEGATIVE mg/dL
Leukocytes, UA: NEGATIVE
NITRITE: NEGATIVE
PH: 6 (ref 5.0–8.0)
Protein, ur: NEGATIVE mg/dL
Specific Gravity, Urine: 1.016 (ref 1.005–1.030)

## 2017-02-17 LAB — BASIC METABOLIC PANEL
ANION GAP: 10 (ref 5–15)
BUN: 67 mg/dL — ABNORMAL HIGH (ref 6–20)
CHLORIDE: 107 mmol/L (ref 101–111)
CO2: 25 mmol/L (ref 22–32)
CREATININE: 2 mg/dL — AB (ref 0.61–1.24)
Calcium: 8.8 mg/dL — ABNORMAL LOW (ref 8.9–10.3)
GFR calc non Af Amer: 30 mL/min — ABNORMAL LOW (ref >60)
GFR, EST AFRICAN AMERICAN: 35 mL/min — AB (ref >60)
Glucose, Bld: 200 mg/dL — ABNORMAL HIGH (ref 65–99)
Potassium: 5.5 mmol/L — ABNORMAL HIGH (ref 3.5–5.1)
SODIUM: 142 mmol/L (ref 135–145)

## 2017-02-17 MED ORDER — SODIUM CHLORIDE 0.9 % IV BOLUS (SEPSIS)
1000.0000 mL | Freq: Once | INTRAVENOUS | Status: AC
  Administered 2017-02-17: 1000 mL via INTRAVENOUS

## 2017-02-17 NOTE — ED Notes (Signed)
Patient ambulated in hall with pulse ox. O2 saturations 96-97%. Patient states he started feeling SOB by the time we got back.

## 2017-02-17 NOTE — ED Provider Notes (Signed)
[facility] DEPT Provider Note   CSN: [id number] Arrival date & time: 02/17/17  1501     History   Chief Complaint No chief complaint on file.   HPI Richard [name] is a 81 y.o. male with a past medical history significant for CAD, COPD, Wegener disease, Bronchiectasis who presents today with shortness of breath. Patient reports for the past two weeks her has had difficulty breathing and has been very short of breath mostly on exertion. Patient has been able to ambulate but with great limitation to to respiratory status. Patient was recently treated for PNA and completed his antibiotic course and interval imaging showed complete resolution. Patient also endorses productive cough, and some congestion, rhinorrhea but denies any fever or chills. Patient also denies chest pain, abdominal pain, dizziness, nausea, vomiting.   HPI  Past Medical History:  Diagnosis Date  . Adenomatous colon polyp   . Anemia   . Anemia   . Asthma   . BPH (benign prostatic hyperplasia)    sees Dr. Risa Grill, biopsy June 2015 was benign   . Bronchiectasis ([place])   . CAD (coronary artery disease)    a. [place] s/p [place] x3 and ultimately BMS to pLAD (vision BMS 3.0 x 18), POBA to CTO of Cx-OM 03/17/14 and staged Rotablator-PCI w/ BMS (Mini Vision 2.5 x 28) to mRCA on 03/21/14  . Chronic bronchitis ([place])   . COPD (chronic obstructive pulmonary disease) ([place])   . Diverticulosis   . Femoral artery pseudo-aneurysm, right ([place])    a. s/p repair. Follow up with Dr. Kellie Simmering   . Fungal infection    lungs  . GERD (gastroesophageal reflux disease)   . Hiatal hernia   . History of blood transfusion 07/2016; 11/01/2016   "low blood; low blood"  . History of kidney stones   . HOH (hard of hearing)    bilaterally  . HTN (hypertension) 09/28/2013  . Moderate aortic stenosis by prior echocardiogram 07/08/2016   Echo 10/2015: Now Moderate AD (AVA ~1.2 cm) EF 60-65%. GR 1 DD. Echo 11/03/16:EF 60-65% with moderate LVH. Only noted  grade 1 diastolic dysfunction. Moderate AS with moderate calcification and thickening. (AVA ~1.2-1.3 cm)  . Peptic stricture of esophagus   . Pneumonia    "1-2 times" (11/01/2016)  . Wegener's granulomatosis ([place])    sees Dr. [name] ; "went from my lungs to my kidneys" (11/01/2016)    Patient Active Problem List   Diagnosis Date Noted  . Anemia, chronic disease   . Dehydration   . HCAP (healthcare-associated pneumonia)   . Sepsis ([place]) 01/05/2017  . Lobar pneumonia ([place]) 01/05/2017  . Compression fracture of thoracic spine, non-traumatic ([place]) 01/05/2017  . AKI (acute kidney injury) ([place]) 01/05/2017  . Hypotension 01/05/2017  . CAD in native artery 11/14/2016  . Melena 11/01/2016  . Malnutrition of moderate degree 08/22/2016  . Influenza A 08/21/2016  . AVM (arteriovenous malformation) of colon with hemorrhage   . Symptomatic anemia 07/30/2016  . Gastrointestinal hemorrhage with melena 07/30/2016  . Aortic valve stenosis 07/08/2016  . Protein-calorie malnutrition, severe ([place]) 04/21/2015  . CAP (community acquired pneumonia) 04/18/2015  . Chronic kidney disease, stage IV (severe) ([place]) 03/26/2015  . GERD (gastroesophageal reflux disease) 08/20/2014  . Wegener's granulomatosis with renal involvement ([place]) 07/15/2014  . Pseudoaneurysm of right femoral artery ([place]) 04/08/2014  . Acute blood loss anemia 03/20/2014  . CAD- severe 4 V CAD- not CABG candidate: BMS-pLAD, mRCA &PTCA of 100% Cx-OM 03/17/2014  . Anemia 03/17/2014  .  CKD (chronic kidney disease), stage III 03/17/2014  . Cardiomyopathy, ischemic- EF 40% by [facility] 03/17/2014  . Dyspnea 03/13/2014  . Abnormal nuclear stress test: Intermediate risk with moderate region of apical and inferior scar with mild superimposed ischemia; EF 40% 03/12/2014  . Loss of weight 12/12/2013  . Abdominal pain, unspecified site 12/12/2013  . SVT (supraventricular tachycardia) ([place]) 10/04/2013  . SOB (shortness of breath) 10/04/2013  .  Weakness generalized 10/04/2013  . Hypertension 09/28/2013  . Chronic rhinitis 09/27/2013  . Chronic respiratory failure ([place]) 12/26/2012  . Bronchiectasis with acute exacerbation ([place]) 09/28/2011  . OTHER DYSPHAGIA 10/25/2010  . Wegener's granulomatosis ([place]) 05/10/2010  . GERD 03/10/2010  . ARTHRITIS, CERVICAL SPINE 03/10/2010  . Hypothyroidism 11/13/2008  . VITAMIN D DEFICIENCY 11/13/2008  . Hyperlipidemia with target low density lipoprotein (LDL) cholesterol less than 70 mg/dL 11/13/2008  . ANEMIA 11/13/2008  . BPH (benign prostatic hyperplasia) 12/04/2007  . Obstructive bronchiectasis ([place]) 01/25/2007    Past Surgical History:  Procedure Laterality Date  . CARDIAC CATHETERIZATION  03/17/2014   Procedure: CORONARY BALLOON ANGIOPLASTY;  Surgeon: Troy Sine, MD;  Location: [facility] CATH LAB;  Service: Cardiovascular;;  . CATARACT EXTRACTION W/ INTRAOCULAR LENS  IMPLANT, BILATERAL  12/18/2012  . CHOLECYSTECTOMY N/A 12/21/2012   Procedure: LAPAROSCOPIC CHOLECYSTECTOMY WITH INTRAOPERATIVE CHOLANGIOGRAM;  Surgeon: Edward Jolly, MD;  Location: [facility] ORS;  Service: General;  Laterality: N/A;  . COLONOSCOPY  08-16-11   per Dr. Fuller Plan, diverticulosis and polyps, repeat in 5 yrs   . COLONOSCOPY N/A 08/01/2016   Procedure: COLONOSCOPY;  Surgeon: Carol Ada, MD;  Location: [facility] ENDOSCOPY;  Service: Endoscopy;  Laterality: N/A;  . ESOPHAGOGASTRODUODENOSCOPY N/A 07/31/2016   Procedure: ESOPHAGOGASTRODUODENOSCOPY (EGD);  Surgeon: Carol Ada, MD;  Location: [facility];  Service: Endoscopy;  Laterality: N/A;  . ESOPHAGOGASTRODUODENOSCOPY (EGD) WITH ESOPHAGEAL DILATION  11-29-10   per Dr. Fuller Plan   . HEMATOMA EVACUATION Right 03/19/2014   Procedure: Suture repair of femoral artery with evacuation of hematoma;  Surgeon: Mal Misty, MD;  Location: [facility];  Service: Vascular;  Laterality: Right;  . INGUINAL HERNIA REPAIR Right ~ 2002  . LEFT HEART CATHETERIZATION WITH CORONARY ANGIOGRAM N/A 03/14/2014    Procedure: LEFT HEART CATHETERIZATION WITH CORONARY ANGIOGRAM;  Surgeon: Leonie Man, MD;  Location: [facility] CATH LAB: pLAD 90% calcified lesion --> after D1, LAD is Subtotally occluded. mCx 100% (L-L collaterals). pRI 60-80%. mRCA diffuse 80-95%.  Marland Kitchen LUNG BIOPSY  03/2014  . PERCUTANEOUS CORONARY STENT INTERVENTION (PCI-S) N/A 03/17/2014   Procedure: PERCUTANEOUS CORONARY STENT INTERVENTION (PCI-S);  Surgeon: Troy Sine, MD;  Location[facility] CATH LAB: Vision BMS 3.0 mm x 18 mm -- 3.2 mm); PTCA of CTO Cx-OM  . PERCUTANEOUS CORONARY STENT INTERVENTION (PCI-S) N/A 03/21/2014   Procedure: PERCUTANEOUS CORONARY STENT INTERVENTION (PCI-S);  Surgeon: Burnell Blanks, MD;  Location: [facility] CATH LAB:  Rotational Atherectomy -- Mini [place] BMS 2.5 x 28 mm --> 2.75 mm)  . RENAL BIOPSY    . TRANSTHORACIC ECHOCARDIOGRAM  10/2015; 10/2016   a. Now Moderate AD (AVA ~1.2 cm) EF 60-65%. GR 1 DD.;; b. Stable -- EF 60-65% with moderate LVH. Only noted grade 1 diastolic dysfunction. Moderate aortic stenosis with moderate calcification and thickening. (Estimated valve area between 1.2-1.3 cm)     Home Medications    Prior to Admission medications   Medication Sig Start Date End Date Taking? Authorizing Provider  acetaminophen (TYLENOL) 500 MG tablet Take 2 tablets (1,000 mg total) by mouth every 8 (  eight) hours. Patient taking differently: Take 1,000 mg by mouth every 8 (eight) hours as needed for mild pain or moderate pain.  01/09/17  Yes Eugenie Filler, MD  albuterol (VENTOLIN HFA) 108 (90 Base) MCG/ACT inhaler Inhale 1-2 puffs into the lungs every 4 (four) hours as needed for wheezing or shortness of breath. 07/12/16  Yes Tanda Rockers, MD  aspirin EC 81 MG tablet Take 81 mg by mouth daily.   Yes [provider]  b complex vitamins tablet Take 1 tablet by mouth daily.     Yes [provider]  benzonatate (TESSALON) 200 MG capsule Take 1 capsule (200 mg total) by mouth 3 (three) times daily  as needed for cough. 01/09/17  Yes Eugenie Filler, MD  bisacodyl (DULCOLAX) 5 MG EC tablet Take 5 mg by mouth 2 (two) times daily.    Yes [provider]  calcitRIOL (ROCALTROL) 0.25 MCG capsule Take 0.25 mcg by mouth daily.  09/06/15  Yes [provider]  cyclophosphamide (CYTOXAN) 25 MG capsule Take 25 mg by mouth daily. Take one capsule by mouth daily on an empty stomach 1 hour before or 2 hours after meal. 07/26/16  Yes [provider]  finasteride (PROSCAR) 5 MG tablet Take 1 tablet (5 mg total) by mouth daily. 11/18/16  Yes [name], MD  HYDROcodone-homatropine (HYDROMET) 5-1.5 MG/5ML syrup Take 5 mLs by mouth every 4 (four) hours as needed. Patient taking differently: Take 5 mLs by mouth every 4 (four) hours as needed for cough.  02/06/17  Yes [name], MD  ipratropium-albuterol (DUONEB) 0.5-2.5 (3) MG/3ML SOLN Take 3 mLs by nebulization every 4 (four) hours as needed (if inhaler is not effective in resolving shortness of breath or wheezing). 02/03/16  Yes Tanda Rockers, MD  loratadine (CLARITIN) 10 MG tablet Take 10 mg by mouth daily.    Yes [provider]  Methylcellulose, Laxative, (CITRUCEL PO) Take 1 packet by mouth daily.   Yes [provider]  metoprolol succinate (TOPROL-XL) 25 MG 24 hr tablet TAKE ONE TABLET BY MOUTH ONCE DAILY 01/26/17  Yes Hilty, Nadean Corwin, MD  ondansetron (ZOFRAN) 4 MG tablet Take 1 tablet (4 mg total) by mouth every 8 (eight) hours as needed for nausea or vomiting. 01/10/17  Yes [name], MD  oxyCODONE-acetaminophen (PERCOCET) 10-325 MG tablet Take 1 tablet by mouth every 6 (six) hours as needed for pain. 12/22/16  Yes [name], MD  predniSONE (DELTASONE) 20 MG tablet Take 20 mg by mouth daily with breakfast.   Yes [provider]  ranitidine (ZANTAC) 300 MG tablet Take 1 tablet (300 mg total) by mouth 2 (two) times daily. 11/18/16  Yes [name], MD  simvastatin (ZOCOR) 20 MG tablet TAKE  ONE TABLET BY MOUTH ONCE DAILY AT  6  PM 07/07/16  Yes Hilty, Nadean Corwin, MD  sodium bicarbonate 650 MG tablet Take 2 tablets (1,300 mg total) by mouth 2 (two) times daily. NEED OV. 08/12/15  Yes Hilty, Nadean Corwin, MD  SYMBICORT 160-4.5 MCG/ACT inhaler INHALE TWO PUFFS BY MOUTH TWICE DAILY 06/01/16  Yes Tanda Rockers, MD  tamsulosin (FLOMAX) 0.4 MG CAPS capsule TAKE ONE CAPSULE BY MOUTH ONCE DAILY IN THE EVENING 01/13/17  Yes [name], MD  dextromethorphan (DELSYM) 30 MG/5ML liquid Take 30 mg by mouth at bedtime as needed for cough.    [provider]  guaiFENesin-codeine 100-10 MG/5ML syrup Take 10 mLs by mouth every  6 (six) hours as needed for cough. 08/23/16   Eber Jones, MD  nitroGLYCERIN (NITROSTAT) 0.4 MG SL tablet Place 1 tablet (0.4 mg total) under the tongue every 5 (five) minutes x 3 doses as needed for chest pain. 03/24/15   Hilty, Nadean Corwin, MD  predniSONE (DELTASONE) 5 MG tablet Take 7.5 mg by mouth daily with breakfast.    [provider]  simethicone (MYLICON) 315 MG chewable tablet Chew 250 mg by mouth 2 (two) times daily as needed for flatulence. TAKE PER BOX FOR GAS     [provider]  sodium chloride (OCEAN) 0.65 % SOLN nasal spray Place 1 spray into both nostrils as needed for congestion.    [provider]    Family History Family History  Problem Relation Age of Onset  . Asthma Father   . Coronary artery disease Mother   . Coronary artery disease Brother   . Coronary artery disease Brother   . Colon cancer Neg Hx   . Stomach cancer Neg Hx     Social History Social History  Substance Use Topics  . Smoking status: Never Smoker  . Smokeless tobacco: Never Used  . Alcohol use No     Allergies   Patient has no known allergies.   Review of Systems Review of Systems  Constitutional: Positive for fatigue.  HENT: Positive for congestion and rhinorrhea.   Eyes: Negative.   Respiratory: Positive for shortness of  breath. Negative for wheezing and stridor.   Cardiovascular: Negative.   Gastrointestinal: Negative.   Endocrine: Negative.   Genitourinary: Negative.   Musculoskeletal: Positive for back pain.  Skin: Negative.   Allergic/Immunologic: Negative.   Neurological: Negative.   Hematological: Negative.   Psychiatric/Behavioral: Negative.      Physical Exam Updated Vital Signs BP (!) 131/108   Pulse 74   Temp 98 F (36.7 C) (Oral)   Resp (!) 21   SpO2 100%   Physical Exam  Constitutional: He is oriented to person, place, and time.  HENT:  Head: Normocephalic and atraumatic.  Eyes: Pupils are equal, round, and reactive to light. EOM are normal.  Neck: Normal range of motion. Neck supple.  Cardiovascular: Normal rate and regular rhythm.   Murmur heard. Holosystolic murmur 3/6 right sternal border   Pulmonary/Chest: Effort normal and breath sounds normal. He has no wheezes. He has no rales.  Abdominal: Soft. Bowel sounds are normal.  Musculoskeletal: Normal range of motion.  Neurological: He is alert and oriented to person, place, and time.  Skin: Skin is warm and dry.  Psychiatric: He has a normal mood and affect. His behavior is normal.     ED Treatments / Results  Labs (all labs ordered are listed, but only abnormal results are displayed) Labs Reviewed  BASIC METABOLIC PANEL - Abnormal; Notable for the following:       Result Value   Potassium 5.5 (*)    Glucose, Bld 200 (*)    BUN 67 (*)    Creatinine, Ser 2.00 (*)    Calcium 8.8 (*)    GFR calc non Af Amer 30 (*)    GFR calc Af Amer 35 (*)    All other components within normal limits  CBC WITH DIFFERENTIAL/PLATELET - Abnormal; Notable for the following:    RBC 2.79 (*)    Hemoglobin 10.0 (*)    HCT 30.9 (*)    MCV 110.8 (*)    [name] 35.8 (*)    RDW 19.2 (*)  Neutro Abs 9.1 (*)    Lymphs Abs 0.2 (*)    All other components within normal limits  URINALYSIS, ROUTINE W REFLEX MICROSCOPIC - Abnormal; Notable for  the following:    APPearance HAZY (*)    All other components within normal limits    EKG  EKG Interpretation None       Radiology Dg Chest 2 View  Result Date: 02/17/2017 CLINICAL DATA:  Shortness of breath.  Cough. EXAM: CHEST  2 VIEW COMPARISON:  Radiographs of January 27, 2017. FINDINGS: The heart size and mediastinal contours are within normal limits. Atherosclerosis of thoracic aorta is noted. Stable interstitial densities are noted throughout both lungs most consistent with scarring or fibrosis ; this is most prominently seen in the right lung base. No pneumothorax or pleural effusion is noted. Stable old compression fracture of midthoracic vertebral body. IMPRESSION: Aortic atherosclerosis. Stable bilateral lung opacities are noted concerning for scarring or fibrosis. No new opacities are noted. Electronically Signed   By: Marijo Conception, M.D.   On: 02/17/2017 16:00    Procedures Procedures (including critical care time)  Medications Ordered in ED Medications  sodium chloride 0.9 % bolus 1,000 mL (0 mLs Intravenous Stopped 02/17/17 1814)     Initial Impression / Assessment and Plan / ED Course  Patient presented with shortness of breath on exertion worsening for the past two weeks with cough , congestion and rhinorrhea initially concering for pneumonia. Patient recently completed an antibiotic course for CAP (6/2) with interval imaging CXR (6/22) showing complete resolution. Patient has been afebrile and does not appears toxic.CXR today did not show any infiltrates concerning for pneumonia but showed evidence of bilateral lung opacities concerning for scarring or fibrosis. Patient has a history of Wegener and COPD and shortness of breath likely to be secondary to worsening lung disease. Patient maintained good saturation with ambulation today in the ED between 98-100%. Vitals have been stable, low likelihood for PE or cardiac process. Patient is currently on a prednisone taper course  and has a pulmonology appointment for this Monday 7/16. Patient also with mild AKI (Creatinine 2.0, baseline 1.5-1.6) but has had poor PO intake the past few days. Patient will be discharged with close follow up with pulmonologist and will likely need oxygen gong forward. Return precautions given for worsening clinical picture, patient in agreement with plan.  I have reviewed the triage vital signs and the nursing notes.  Pertinent labs & imaging results that were available during my care of the patient were reviewed by me and considered in my medical decision making (see chart for details).     Final Clinical Impressions(s) / ED Diagnoses   Final diagnoses:  Dyspnea on exertion    New Prescriptions Discharge Medication List as of 02/17/2017  7:14 PM       Marjie Skiff, MD 02/17/17 2013    Carmin Muskrat, MD 02/18/17 [phone]

## 2017-02-17 NOTE — ED Triage Notes (Signed)
Pt c/o weakness, SOB, cough x several weeks, seen in ED on 01/17/17, diagnosed with pneumonia, has tried multiple antibiotics and is not getting better. Was initially treated with Levaquin. Last week pt saw MD, prescribed 100 mg doxycycline due to still having pneumonia.

## 2017-02-17 NOTE — Discharge Instructions (Signed)
Please make sure that you follow up with your pulmonologist, and your cardiologist next week. Please drink plenty of fluids and make sure you continue to take your prednisone. Please return if you develop, fever, worsening cough and feels worse overall.

## 2017-02-20 ENCOUNTER — Other Ambulatory Visit (INDEPENDENT_AMBULATORY_CARE_PROVIDER_SITE_OTHER): Payer: Medicare Other

## 2017-02-20 ENCOUNTER — Other Ambulatory Visit: Payer: Self-pay | Admitting: Internal Medicine

## 2017-02-20 ENCOUNTER — Ambulatory Visit (INDEPENDENT_AMBULATORY_CARE_PROVIDER_SITE_OTHER)
Admission: RE | Admit: 2017-02-20 | Discharge: 2017-02-20 | Disposition: A | Discharge status: Home / Self Care | Payer: Medicare Other | Source: Ambulatory Visit | Attending: Internal Medicine | Admitting: Internal Medicine

## 2017-02-20 ENCOUNTER — Telehealth: Payer: Self-pay | Admitting: Internal Medicine

## 2017-02-20 ENCOUNTER — Encounter: Payer: Self-pay | Admitting: Internal Medicine

## 2017-02-20 ENCOUNTER — Ambulatory Visit (INDEPENDENT_AMBULATORY_CARE_PROVIDER_SITE_OTHER): Payer: Medicare Other | Admitting: Internal Medicine

## 2017-02-20 VITALS — BP 106/60 | HR 96 | Ht 67.0 in | Wt 95.4 lb

## 2017-02-20 DIAGNOSIS — J479 Bronchiectasis, uncomplicated: Secondary | ICD-10-CM

## 2017-02-20 DIAGNOSIS — M3131 Wegener's granulomatosis with renal involvement: Secondary | ICD-10-CM | POA: Diagnosis not present

## 2017-02-20 DIAGNOSIS — I350 Nonrheumatic aortic (valve) stenosis: Secondary | ICD-10-CM | POA: Diagnosis not present

## 2017-02-20 LAB — CBC WITH DIFFERENTIAL/PLATELET
BASOS ABS: 0 10*3/uL (ref 0.0–0.1)
Basophils Relative: 0.2 % (ref 0.0–3.0)
EOS ABS: 0 10*3/uL (ref 0.0–0.7)
Eosinophils Relative: 0.1 % (ref 0.0–5.0)
HCT: 29.7 % — ABNORMAL LOW (ref 39.0–52.0)
Hemoglobin: 10 g/dL — ABNORMAL LOW (ref 13.0–17.0)
LYMPHS ABS: 0.3 10*3/uL — AB (ref 0.7–4.0)
Lymphocytes Relative: 3.6 % — ABNORMAL LOW (ref 12.0–46.0)
MCHC: 33.6 g/dL (ref 30.0–36.0)
MCV: 109.2 fl — ABNORMAL HIGH (ref 78.0–100.0)
MONO ABS: 0.7 10*3/uL (ref 0.1–1.0)
Monocytes Relative: 10.1 % (ref 3.0–12.0)
NEUTROS ABS: 5.9 10*3/uL (ref 1.4–7.7)
Platelets: 169 10*3/uL (ref 150.0–400.0)
RBC: 2.72 Mil/uL — ABNORMAL LOW (ref 4.22–5.81)
RDW: 20.2 % — AB (ref 11.5–15.5)
WBC: 6.9 10*3/uL (ref 4.0–10.5)

## 2017-02-20 LAB — BASIC METABOLIC PANEL
BUN: 59 mg/dL — ABNORMAL HIGH (ref 6–23)
CALCIUM: 8.7 mg/dL (ref 8.4–10.5)
CO2: 28 meq/L (ref 19–32)
CREATININE: 1.85 mg/dL — AB (ref 0.40–1.50)
Chloride: 104 mEq/L (ref 96–112)
GFR: 37.7 mL/min — ABNORMAL LOW (ref >60.00)
GLUCOSE: 108 mg/dL — AB (ref 70–99)
Potassium: 4.5 mEq/L (ref 3.5–5.1)
Sodium: 140 mEq/L (ref 135–145)

## 2017-02-20 LAB — SEDIMENTATION RATE: SED RATE: 8 mm/h (ref 0–20)

## 2017-02-20 NOTE — Telephone Encounter (Signed)
ATC but received a busy signal. Will call back later.   Results have not been reviewed by MW.

## 2017-02-20 NOTE — Progress Notes (Signed)
Spoke with pt and notified of results per Dr. [name]. Pt verbalized understanding and denied any questions. 

## 2017-02-20 NOTE — Progress Notes (Signed)
Subjective:   Patient ID: Richard [name], male    DOB: 06-07-1936     MRN: [id number]   Brief patient profile:  21  yowm never smoker with dx of Longstanding bronchiectasis then dx with WG 03/2010 > requested establish with Richard [name].   Admit [facility] dx WG by pos c anca 03/2010, supportive tbbx but not vats   Admit [name] dx spont L Ptx 9/08/15/09 requiring Chest tube but no surgery   11/23/2012 f/u ov/Richard [name] re WG/ bronchiectasis Chief Complaint  Patient presents with  . Follow-up    Had PFT today-sob same,cough-yellow,wheezing occass.,  no hemoptysis, no real limiting sob though sedentary, no arthralgias or rash maintaining 2.5 mg daily No change prednisone 5 mg one half daily  > ok to try every other day if doing great May 1 flare rx levaquin and pred May 16 lap chole [facility]   08/19/2013 f/u ov/Richard [name] re: ? Recurrent WG Chief Complaint  Patient presents with  . Follow-up    Breathing is unchanged. Cough unchanged-prod cpugh w/ yellow phlem. On 2nd dose of cipro  now on day 5/10 cipro and less dark, never bloody, no sinus complaints, arthritis rash or fever or hematuria. >ANCA 1:80 titer, started on cytoxan          02/01/2016  f/u ov/Richard [name] re: obst bronchiectasis maint rx = symbicort 160 2 bid and duoneb bid Chief Complaint  Patient presents with  . Follow-up    PFT done today.  Pt went to ED in Bronchiectasis flare 01/07/16- was txed with Augmentin. He is using duoneb 2 x daily. His cough is prod with yellow sputum.  back to baseline doe = MMRC=1  but confused with meds again , esp prns rec Plan A = Automatic = Symbicort 160 Take 2 puffs first thing in am and then another 2 puffs about 12 hours later.  Plan B = Backup Only use your albuterol (Red =Proair/ yellow = Proventil) as a rescue medication  Plan C = Crisis - only use your albuterol/ipatropium(duoneb) nebulizer if you first try Plan B    07/14/2016 Follow up : Bronchiectasis /WG  Pt returns for 1 month follow up . Had a  bronchiectatic flare last ov , tx w/ 10 d of augmentin. Says he is feeling better. Still has cough and congestion although less .  Mucus varies with white to yellow at time. Sometimes hard to get up . rec Continue on current regimen  Check sputum culture > done 12/151/7 . > pos pseudomonas sensitive to CIPRO> Follow med calendar closely and bring to each visit.     09/15/2016  f/u ov/Richard [name] re: bronchiectasis/ WG pred still at 7.5 mg daily/ ctx per renal  Chief Complaint  Patient presents with  . Follow-up    x's with the flu back in Jan, after illness he feels congested, lack of energy, SOB, cough is not better, loss of appetite   Sputum is not purulent / complaints are all non-specific and really no change in baseline doe rec Continue on current regimen with flutter valve  May stop Tramadol , may use Hydromet 1 tsp every 6 hr as needed. -may make you sleepy  Follow med calendar closely and bring to each visit.       02/20/2017 acute extended ov/Richard [name] re: sob / Pred 20 mg daily  Chief Complaint  Patient presents with  . Acute Visit    Was in the ED 6/22 and 7/13. On the visit of 6/22 pt was admitted for  pneumonia; no admission with 7/13 visit. Pt states that he is more winded, cough worse, and cannot move around without losing his breath. Pt states that he has gotten worse within the last month; very fatigued.   No improvement since ER Eval 02/17/17  Has not tried levaquin lately and persistent  yellow mucus p rx with cefuroxime and not using flutter  Onset was insidious sob gradually worse x 4-6 weeks Not using med calendar maint or action plans   No obvious day to day or daytime variability or assoc  mucus plugs or hemoptysis or cp or chest tightness, subjective wheeze or overt sinus or hb symptoms. No unusual exp hx or h/o childhood pna/ asthma or knowledge of premature birth.  Sleeping ok without nocturnal  or early am exacerbation  of respiratory  c/o's or need for noct saba. Also  denies any obvious fluctuation of symptoms with weather or environmental changes or other aggravating or alleviating factors except as outlined above   Current Medications, Allergies, Complete Past Medical History, Past Surgical History, Family History, and Social History were reviewed in [employer] record.  ROS  The following are not active complaints unless bolded sore throat, dysphagia, dental problems, itching, sneezing,  nasal congestion or excess/ purulent secretions, ear ache,   fever, chills, sweats, unintended wt loss, classically pleuritic or exertional cp,  orthopnea pnd or leg swelling, presyncope, palpitations, abdominal pain, anorexia, nausea, vomiting, diarrhea  or change in bowel or bladder habits, change in stools or urine, dysuria,hematuria,  rash, arthralgias, visual complaints, headache, numbness, weakness or ataxia or problems with walking or coordination,  change in mood/affect or memory.                            Objective:   Physical Exam  wt   128  04/26/11 >07/08/2011  134> 134 11/28/2011 > 02/29/2012 136> 05/31/2012  136>  134 11/22/12 > 07/26/2013  136 > 08/21/2013  136 >133 09/06/2013 >  09/12/2013  133 > 132 09/27/2013 > 10/15/2013 131 > 11/07/2013  128 > 01/09/14 124 > 02/12/2014  123 > 04/11/2014   117 > 05/23/2014 119 > 08/19/2014 110 > 10/30/2014  120 >  04/09/2015 120 > 07/08/2015  119 > 10/13/2015  114 > 02/01/2016   117 > 02/29/2016   118 >  06/09/2016  125 >  09/15/2016  114 > 02/20/2017  95       W/c bound   Chronically but not acutely  ill appearing  wm nad  Vital signs reviewed  - Note on arrival 02 sats  91% on RA   SKIN: no rash, lesions  NODES: no lymphadenopathy  HEENT: [place]/AT, EOM- WNL, Conjuctivae- clear, PERRLA, TM-WNL, Nose- clear, Throat- clear and wnl, Mallampati III  NECK: Supple w/ fair ROM, JVD- none, normal carotid impulses w/o bruits   CHEST: junky insp and exp rhonchi diffusely bilaterally  HEART: RRR, II- III / VI sem s gallop noted    ABDOMEN: Soft and nl EXT:  No deformities or restrictions      CXR PA and Lateral:   02/20/2017 :    I personally reviewed images and agree with radiology impression as follows:    1. Chronic lung disease with bronchiectasis and peribronchial opacity appears stable since 01/27/2017. 2.  No superimposed acute findings are identified. My impression:  Markings are diffusely more prominent vs 11/03/16        Labs ordered/ reviewed:  Chemistry      Component Value Date/Time   NA 146 (H) 02/23/2017 0243   NA 138 01/30/2017   K 5.1 02/23/2017 0243   CL 113 (H) 02/23/2017 0243   CO2 25 02/23/2017 0243   BUN 59 (H) 02/23/2017 0243   BUN 25 (A) 01/30/2017   CREATININE 1.68 (H) 02/23/2017 0243   CREATININE 1.92 (H) 03/24/2015 0919   GLU 104 01/30/2017      Component Value Date/Time   CALCIUM 7.8 (L) 02/23/2017 0243   CALCIUM 8.7 06/09/2015 1342   ALKPHOS 126 02/19/2017 1038   AST 15 02/22/2017 1038   ALT 12 (L) 02/06/2017 1038   BILITOT 1.1 03/06/2017 1038        Lab Results  Component Value Date   WBC 7.0 02/25/2017   HGB 8.4 (L) 02/25/2017   HCT 27.6 (L) 02/25/2017   MCV 112.7 (H) 02/25/2017   PLT 99 (L) 02/25/2017         Lab Results  Component Value Date   TSH 0.758 02/22/2017         Lab Results  Component Value Date   ESRSEDRATE 8 02/20/2017   ESRSEDRATE 34 (H) 09/15/2016   ESRSEDRATE 8 02/29/2016

## 2017-02-20 NOTE — Patient Instructions (Signed)
Take levaquin as per your action plan on your med calendar action plan and follow all those recommendations as well where they are listed using  the medications your already have to use for each problem that pops up   Please remember to go to the lab and x-ray department downstairs in the basement  for your tests - we will call you with the results when they are available.

## 2017-02-21 ENCOUNTER — Encounter (HOSPITAL_COMMUNITY): Payer: Self-pay

## 2017-02-21 ENCOUNTER — Inpatient Hospital Stay (HOSPITAL_COMMUNITY): Admission: RE | Admit: 2017-02-21 | Payer: Medicare Other | Source: Ambulatory Visit

## 2017-02-21 ENCOUNTER — Other Ambulatory Visit: Payer: Self-pay

## 2017-02-21 ENCOUNTER — Emergency Department (HOSPITAL_COMMUNITY): Payer: Medicare Other

## 2017-02-21 ENCOUNTER — Telehealth: Payer: Self-pay | Admitting: Family Medicine

## 2017-02-21 ENCOUNTER — Inpatient Hospital Stay (HOSPITAL_COMMUNITY)
Admission: EM | Admit: 2017-02-21 | Discharge: 2017-03-08 | DRG: 190 | Disposition: E | Payer: Medicare Other | Attending: Internal Medicine | Admitting: Internal Medicine

## 2017-02-21 DIAGNOSIS — Z66 Do not resuscitate: Secondary | ICD-10-CM | POA: Diagnosis not present

## 2017-02-21 DIAGNOSIS — K5790 Diverticulosis of intestine, part unspecified, without perforation or abscess without bleeding: Secondary | ICD-10-CM | POA: Diagnosis present

## 2017-02-21 DIAGNOSIS — Z8249 Family history of ischemic heart disease and other diseases of the circulatory system: Secondary | ICD-10-CM

## 2017-02-21 DIAGNOSIS — I480 Paroxysmal atrial fibrillation: Secondary | ICD-10-CM | POA: Diagnosis present

## 2017-02-21 DIAGNOSIS — M3130 Wegener's granulomatosis without renal involvement: Secondary | ICD-10-CM | POA: Diagnosis present

## 2017-02-21 DIAGNOSIS — J690 Pneumonitis due to inhalation of food and vomit: Secondary | ICD-10-CM | POA: Diagnosis present

## 2017-02-21 DIAGNOSIS — R627 Adult failure to thrive: Secondary | ICD-10-CM

## 2017-02-21 DIAGNOSIS — J479 Bronchiectasis, uncomplicated: Secondary | ICD-10-CM | POA: Diagnosis not present

## 2017-02-21 DIAGNOSIS — G8929 Other chronic pain: Secondary | ICD-10-CM | POA: Diagnosis present

## 2017-02-21 DIAGNOSIS — I080 Rheumatic disorders of both mitral and aortic valves: Secondary | ICD-10-CM | POA: Diagnosis present

## 2017-02-21 DIAGNOSIS — Z7189 Other specified counseling: Secondary | ICD-10-CM

## 2017-02-21 DIAGNOSIS — J9621 Acute and chronic respiratory failure with hypoxia: Secondary | ICD-10-CM | POA: Diagnosis present

## 2017-02-21 DIAGNOSIS — J471 Bronchiectasis with (acute) exacerbation: Principal | ICD-10-CM | POA: Diagnosis present

## 2017-02-21 DIAGNOSIS — R1312 Dysphagia, oropharyngeal phase: Secondary | ICD-10-CM | POA: Diagnosis not present

## 2017-02-21 DIAGNOSIS — E878 Other disorders of electrolyte and fluid balance, not elsewhere classified: Secondary | ICD-10-CM | POA: Diagnosis not present

## 2017-02-21 DIAGNOSIS — Z955 Presence of coronary angioplasty implant and graft: Secondary | ICD-10-CM | POA: Diagnosis not present

## 2017-02-21 DIAGNOSIS — K279 Peptic ulcer, site unspecified, unspecified as acute or chronic, without hemorrhage or perforation: Secondary | ICD-10-CM | POA: Diagnosis present

## 2017-02-21 DIAGNOSIS — J9601 Acute respiratory failure with hypoxia: Secondary | ICD-10-CM | POA: Diagnosis not present

## 2017-02-21 DIAGNOSIS — I959 Hypotension, unspecified: Secondary | ICD-10-CM | POA: Diagnosis present

## 2017-02-21 DIAGNOSIS — Z9841 Cataract extraction status, right eye: Secondary | ICD-10-CM

## 2017-02-21 DIAGNOSIS — N400 Enlarged prostate without lower urinary tract symptoms: Secondary | ICD-10-CM | POA: Diagnosis present

## 2017-02-21 DIAGNOSIS — F329 Major depressive disorder, single episode, unspecified: Secondary | ICD-10-CM | POA: Diagnosis present

## 2017-02-21 DIAGNOSIS — K210 Gastro-esophageal reflux disease with esophagitis: Secondary | ICD-10-CM | POA: Diagnosis present

## 2017-02-21 DIAGNOSIS — M549 Dorsalgia, unspecified: Secondary | ICD-10-CM | POA: Diagnosis present

## 2017-02-21 DIAGNOSIS — I272 Pulmonary hypertension, unspecified: Secondary | ICD-10-CM | POA: Diagnosis present

## 2017-02-21 DIAGNOSIS — Z515 Encounter for palliative care: Secondary | ICD-10-CM | POA: Diagnosis not present

## 2017-02-21 DIAGNOSIS — R0902 Hypoxemia: Secondary | ICD-10-CM | POA: Diagnosis not present

## 2017-02-21 DIAGNOSIS — R079 Chest pain, unspecified: Secondary | ICD-10-CM

## 2017-02-21 DIAGNOSIS — R64 Cachexia: Secondary | ICD-10-CM | POA: Diagnosis present

## 2017-02-21 DIAGNOSIS — R0602 Shortness of breath: Secondary | ICD-10-CM

## 2017-02-21 DIAGNOSIS — Z9842 Cataract extraction status, left eye: Secondary | ICD-10-CM | POA: Diagnosis not present

## 2017-02-21 DIAGNOSIS — I361 Nonrheumatic tricuspid (valve) insufficiency: Secondary | ICD-10-CM | POA: Diagnosis not present

## 2017-02-21 DIAGNOSIS — Z825 Family history of asthma and other chronic lower respiratory diseases: Secondary | ICD-10-CM

## 2017-02-21 DIAGNOSIS — I255 Ischemic cardiomyopathy: Secondary | ICD-10-CM | POA: Diagnosis present

## 2017-02-21 DIAGNOSIS — J9622 Acute and chronic respiratory failure with hypercapnia: Secondary | ICD-10-CM | POA: Diagnosis not present

## 2017-02-21 DIAGNOSIS — E43 Unspecified severe protein-calorie malnutrition: Secondary | ICD-10-CM | POA: Diagnosis present

## 2017-02-21 DIAGNOSIS — I2510 Atherosclerotic heart disease of native coronary artery without angina pectoris: Secondary | ICD-10-CM | POA: Diagnosis present

## 2017-02-21 DIAGNOSIS — Z681 Body mass index (BMI) 19 or less, adult: Secondary | ICD-10-CM | POA: Diagnosis not present

## 2017-02-21 DIAGNOSIS — Z7952 Long term (current) use of systemic steroids: Secondary | ICD-10-CM

## 2017-02-21 DIAGNOSIS — R1310 Dysphagia, unspecified: Secondary | ICD-10-CM

## 2017-02-21 DIAGNOSIS — E785 Hyperlipidemia, unspecified: Secondary | ICD-10-CM | POA: Diagnosis present

## 2017-02-21 DIAGNOSIS — D638 Anemia in other chronic diseases classified elsewhere: Secondary | ICD-10-CM | POA: Diagnosis present

## 2017-02-21 DIAGNOSIS — H9193 Unspecified hearing loss, bilateral: Secondary | ICD-10-CM | POA: Diagnosis present

## 2017-02-21 DIAGNOSIS — R0603 Acute respiratory distress: Secondary | ICD-10-CM

## 2017-02-21 DIAGNOSIS — E870 Hyperosmolality and hypernatremia: Secondary | ICD-10-CM | POA: Diagnosis not present

## 2017-02-21 DIAGNOSIS — N184 Chronic kidney disease, stage 4 (severe): Secondary | ICD-10-CM | POA: Diagnosis present

## 2017-02-21 DIAGNOSIS — K449 Diaphragmatic hernia without obstruction or gangrene: Secondary | ICD-10-CM | POA: Diagnosis present

## 2017-02-21 DIAGNOSIS — I129 Hypertensive chronic kidney disease with stage 1 through stage 4 chronic kidney disease, or unspecified chronic kidney disease: Secondary | ICD-10-CM | POA: Diagnosis present

## 2017-02-21 DIAGNOSIS — R0609 Other forms of dyspnea: Secondary | ICD-10-CM | POA: Diagnosis not present

## 2017-02-21 DIAGNOSIS — J9620 Acute and chronic respiratory failure, unspecified whether with hypoxia or hypercapnia: Secondary | ICD-10-CM | POA: Diagnosis not present

## 2017-02-21 DIAGNOSIS — K225 Diverticulum of esophagus, acquired: Secondary | ICD-10-CM | POA: Diagnosis present

## 2017-02-21 DIAGNOSIS — R0789 Other chest pain: Secondary | ICD-10-CM

## 2017-02-21 DIAGNOSIS — E039 Hypothyroidism, unspecified: Secondary | ICD-10-CM | POA: Diagnosis present

## 2017-02-21 DIAGNOSIS — Z7982 Long term (current) use of aspirin: Secondary | ICD-10-CM

## 2017-02-21 DIAGNOSIS — R54 Age-related physical debility: Secondary | ICD-10-CM | POA: Diagnosis present

## 2017-02-21 DIAGNOSIS — R0600 Dyspnea, unspecified: Secondary | ICD-10-CM | POA: Diagnosis present

## 2017-02-21 DIAGNOSIS — I4892 Unspecified atrial flutter: Secondary | ICD-10-CM | POA: Diagnosis present

## 2017-02-21 DIAGNOSIS — D126 Benign neoplasm of colon, unspecified: Secondary | ICD-10-CM | POA: Diagnosis present

## 2017-02-21 DIAGNOSIS — Z961 Presence of intraocular lens: Secondary | ICD-10-CM | POA: Diagnosis present

## 2017-02-21 DIAGNOSIS — D696 Thrombocytopenia, unspecified: Secondary | ICD-10-CM | POA: Diagnosis present

## 2017-02-21 DIAGNOSIS — L8990 Pressure ulcer of unspecified site, unspecified stage: Secondary | ICD-10-CM | POA: Insufficient documentation

## 2017-02-21 DIAGNOSIS — L89151 Pressure ulcer of sacral region, stage 1: Secondary | ICD-10-CM | POA: Diagnosis present

## 2017-02-21 HISTORY — DX: Gastrointestinal hemorrhage, unspecified: K92.2

## 2017-02-21 HISTORY — DX: Nonrheumatic mitral (valve) insufficiency: I34.0

## 2017-02-21 HISTORY — DX: Chronic kidney disease, stage 4 (severe): N18.4

## 2017-02-21 HISTORY — DX: Ischemic cardiomyopathy: I25.5

## 2017-02-21 LAB — I-STAT ARTERIAL BLOOD GAS, ED
ACID-BASE DEFICIT: 3 mmol/L — AB (ref 0.0–2.0)
Bicarbonate: 21 mmol/L (ref 20.0–28.0)
O2 SAT: 97 %
TCO2: 22 mmol/L (ref 0–100)
pCO2 arterial: 30.5 mmHg — ABNORMAL LOW (ref 32.0–48.0)
pH, Arterial: 7.446 (ref 7.350–7.450)
pO2, Arterial: 85 mmHg (ref 83.0–108.0)

## 2017-02-21 LAB — TROPONIN I: Troponin I: <0.03 ng/mL (ref <0.03)

## 2017-02-21 LAB — COMPREHENSIVE METABOLIC PANEL
ALK PHOS: 126 U/L (ref 38–126)
ALT: 12 U/L — AB (ref 17–63)
AST: 15 U/L (ref 15–41)
Albumin: 2.4 g/dL — ABNORMAL LOW (ref 3.5–5.0)
Anion gap: 11 (ref 5–15)
BUN: 61 mg/dL — ABNORMAL HIGH (ref 6–20)
CALCIUM: 8.6 mg/dL — AB (ref 8.9–10.3)
CO2: 24 mmol/L (ref 22–32)
CREATININE: 2.07 mg/dL — AB (ref 0.61–1.24)
Chloride: 106 mmol/L (ref 101–111)
GFR, EST AFRICAN AMERICAN: 34 mL/min — AB (ref >60)
GFR, EST NON AFRICAN AMERICAN: 29 mL/min — AB (ref >60)
Glucose, Bld: 107 mg/dL — ABNORMAL HIGH (ref 65–99)
Potassium: 4.9 mmol/L (ref 3.5–5.1)
Sodium: 141 mmol/L (ref 135–145)
Total Bilirubin: 1.1 mg/dL (ref 0.3–1.2)
Total Protein: 4.9 g/dL — ABNORMAL LOW (ref 6.5–8.1)

## 2017-02-21 LAB — URINALYSIS, ROUTINE W REFLEX MICROSCOPIC
BILIRUBIN URINE: NEGATIVE
GLUCOSE, UA: NEGATIVE mg/dL
Hgb urine dipstick: NEGATIVE
KETONES UR: 5 mg/dL — AB
LEUKOCYTES UA: NEGATIVE
Nitrite: NEGATIVE
PROTEIN: NEGATIVE mg/dL
Specific Gravity, Urine: 1.016 (ref 1.005–1.030)
pH: 6 (ref 5.0–8.0)

## 2017-02-21 LAB — CBC WITH DIFFERENTIAL/PLATELET
BASOS PCT: 0 %
Basophils Absolute: 0 10*3/uL (ref 0.0–0.1)
EOS ABS: 0 10*3/uL (ref 0.0–0.7)
Eosinophils Relative: 0 %
HCT: 31.2 % — ABNORMAL LOW (ref 39.0–52.0)
HEMOGLOBIN: 9.9 g/dL — AB (ref 13.0–17.0)
Lymphocytes Relative: 15 %
Lymphs Abs: 0.9 10*3/uL (ref 0.7–4.0)
MCH: 34.5 pg — ABNORMAL HIGH (ref 26.0–34.0)
MCHC: 31.7 g/dL (ref 30.0–36.0)
MCV: 108.7 fL — ABNORMAL HIGH (ref 78.0–100.0)
MONOS PCT: 14 %
Monocytes Absolute: 0.8 10*3/uL (ref 0.1–1.0)
NEUTROS PCT: 71 %
Neutro Abs: 4.3 10*3/uL (ref 1.7–7.7)
PLATELETS: 153 10*3/uL (ref 150–400)
RBC: 2.87 MIL/uL — AB (ref 4.22–5.81)
RDW: 17.8 % — ABNORMAL HIGH (ref 11.5–15.5)
WBC: 6.1 10*3/uL (ref 4.0–10.5)

## 2017-02-21 LAB — PREALBUMIN

## 2017-02-21 LAB — LIPASE, BLOOD: Lipase: 18 U/L (ref 11–51)

## 2017-02-21 MED ORDER — PSYLLIUM 95 % PO PACK
1.0000 | PACK | Freq: Every day | ORAL | Status: DC
  Administered 2017-02-22 – 2017-03-05 (×9): 1 via ORAL
  Filled 2017-02-21 (×14): qty 1

## 2017-02-21 MED ORDER — HEPARIN SODIUM (PORCINE) 5000 UNIT/ML IJ SOLN
5000.0000 [IU] | Freq: Three times a day (TID) | INTRAMUSCULAR | Status: DC
  Administered 2017-02-21 – 2017-03-02 (×25): 5000 [IU] via SUBCUTANEOUS
  Filled 2017-02-21 (×27): qty 1

## 2017-02-21 MED ORDER — OXYCODONE-ACETAMINOPHEN 10-325 MG PO TABS: 1.0000 | ORAL_TABLET | Freq: Four times a day (QID) | ORAL | Status: DC | PRN

## 2017-02-21 MED ORDER — LEVOFLOXACIN IN D5W 750 MG/150ML IV SOLN: 750.0000 mg | Freq: Once | INTRAVENOUS | Status: DC

## 2017-02-21 MED ORDER — IPRATROPIUM-ALBUTEROL 0.5-2.5 (3) MG/3ML IN SOLN
3.0000 mL | Freq: Once | RESPIRATORY_TRACT | Status: AC
  Administered 2017-02-21: 3 mL via RESPIRATORY_TRACT
  Filled 2017-02-21: qty 3

## 2017-02-21 MED ORDER — ACETAMINOPHEN 650 MG RE SUPP: 650.0000 mg | Freq: Four times a day (QID) | RECTAL | Status: DC | PRN

## 2017-02-21 MED ORDER — NITROGLYCERIN 0.4 MG SL SUBL: 0.4000 mg | SUBLINGUAL_TABLET | SUBLINGUAL | Status: DC | PRN

## 2017-02-21 MED ORDER — SIMVASTATIN 20 MG PO TABS
20.0000 mg | ORAL_TABLET | Freq: Every day | ORAL | Status: DC
  Administered 2017-02-21 – 2017-03-05 (×12): 20 mg via ORAL
  Filled 2017-02-21 (×12): qty 1

## 2017-02-21 MED ORDER — OXYCODONE HCL 5 MG PO TABS: 5.0000 mg | ORAL_TABLET | Freq: Four times a day (QID) | ORAL | Status: DC | PRN

## 2017-02-21 MED ORDER — TAMSULOSIN HCL 0.4 MG PO CAPS
0.4000 mg | ORAL_CAPSULE | Freq: Every day | ORAL | Status: DC
  Administered 2017-02-21 – 2017-02-23 (×3): 0.4 mg via ORAL
  Filled 2017-02-21 (×4): qty 1

## 2017-02-21 MED ORDER — FINASTERIDE 5 MG PO TABS
5.0000 mg | ORAL_TABLET | Freq: Every day | ORAL | Status: DC
  Administered 2017-02-21 – 2017-03-05 (×12): 5 mg via ORAL
  Filled 2017-02-21 (×14): qty 1

## 2017-02-21 MED ORDER — ACETAMINOPHEN 325 MG PO TABS: 650.0000 mg | ORAL_TABLET | Freq: Four times a day (QID) | ORAL | Status: DC | PRN

## 2017-02-21 MED ORDER — BENZONATATE 100 MG PO CAPS
200.0000 mg | ORAL_CAPSULE | Freq: Three times a day (TID) | ORAL | Status: DC | PRN
  Administered 2017-02-21: 200 mg via ORAL
  Filled 2017-02-21: qty 2

## 2017-02-21 MED ORDER — HYDROCODONE-HOMATROPINE 5-1.5 MG/5ML PO SYRP
5.0000 mL | ORAL_SOLUTION | ORAL | Status: DC | PRN
  Administered 2017-02-21 – 2017-02-27 (×9): 5 mL via ORAL
  Filled 2017-02-21 (×9): qty 5

## 2017-02-21 MED ORDER — CYCLOPHOSPHAMIDE 25 MG PO CAPS
25.0000 mg | ORAL_CAPSULE | Freq: Every day | ORAL | Status: DC
  Administered 2017-02-21 – 2017-02-27 (×7): 25 mg via ORAL
  Filled 2017-02-21 (×7): qty 1

## 2017-02-21 MED ORDER — ONDANSETRON HCL 4 MG PO TABS
4.0000 mg | ORAL_TABLET | Freq: Four times a day (QID) | ORAL | Status: DC | PRN
  Administered 2017-02-23: 4 mg via ORAL
  Filled 2017-02-21: qty 1

## 2017-02-21 MED ORDER — PREDNISONE 50 MG PO TABS
50.0000 mg | ORAL_TABLET | Freq: Every day | ORAL | Status: DC
  Administered 2017-02-22 – 2017-02-23 (×2): 50 mg via ORAL
  Filled 2017-02-21 (×2): qty 1

## 2017-02-21 MED ORDER — METHYLCELLULOSE (LAXATIVE) PO POWD: 1.0000 | Freq: Every day | ORAL | Status: DC

## 2017-02-21 MED ORDER — ENSURE ENLIVE PO LIQD
237.0000 mL | Freq: Two times a day (BID) | ORAL | Status: DC
  Administered 2017-02-22 (×2): 237 mL via ORAL

## 2017-02-21 MED ORDER — OXYCODONE-ACETAMINOPHEN 5-325 MG PO TABS: 1.0000 | ORAL_TABLET | Freq: Four times a day (QID) | ORAL | Status: DC | PRN

## 2017-02-21 MED ORDER — SIMETHICONE 80 MG PO CHEW: 80.0000 mg | CHEWABLE_TABLET | Freq: Four times a day (QID) | ORAL | Status: DC | PRN

## 2017-02-21 MED ORDER — SODIUM CHLORIDE 0.9 % IV SOLN
INTRAVENOUS | Status: DC
  Administered 2017-02-21: 1000 mL via INTRAVENOUS
  Administered 2017-02-22 – 2017-02-28 (×9): via INTRAVENOUS

## 2017-02-21 MED ORDER — ONDANSETRON HCL 4 MG/2ML IJ SOLN
4.0000 mg | Freq: Four times a day (QID) | INTRAMUSCULAR | Status: DC | PRN
  Administered 2017-02-22: 4 mg via INTRAVENOUS
  Filled 2017-02-21: qty 2

## 2017-02-21 MED ORDER — HEPARIN SODIUM (PORCINE) 5000 UNIT/ML IJ SOLN: 5000.0000 [IU] | Freq: Three times a day (TID) | INTRAMUSCULAR | Status: DC

## 2017-02-21 MED ORDER — ASPIRIN EC 81 MG PO TBEC
81.0000 mg | DELAYED_RELEASE_TABLET | Freq: Every day | ORAL | Status: DC
  Administered 2017-02-21 – 2017-03-05 (×12): 81 mg via ORAL
  Filled 2017-02-21 (×14): qty 1

## 2017-02-21 MED ORDER — SODIUM BICARBONATE 650 MG PO TABS
1300.0000 mg | ORAL_TABLET | Freq: Two times a day (BID) | ORAL | Status: DC
  Administered 2017-02-21 – 2017-03-05 (×23): 1300 mg via ORAL
  Filled 2017-02-21 (×26): qty 2

## 2017-02-21 MED ORDER — ALBUTEROL SULFATE (2.5 MG/3ML) 0.083% IN NEBU
2.5000 mg | INHALATION_SOLUTION | RESPIRATORY_TRACT | Status: DC | PRN
  Administered 2017-02-24 – 2017-03-01 (×2): 2.5 mg via RESPIRATORY_TRACT
  Filled 2017-02-21 (×2): qty 3

## 2017-02-21 MED ORDER — CALCITRIOL 0.25 MCG PO CAPS
0.2500 ug | ORAL_CAPSULE | Freq: Every day | ORAL | Status: DC
  Administered 2017-02-21 – 2017-03-05 (×12): 0.25 ug via ORAL
  Filled 2017-02-21 (×14): qty 1

## 2017-02-21 MED ORDER — MOMETASONE FURO-FORMOTEROL FUM 200-5 MCG/ACT IN AERO
2.0000 | INHALATION_SPRAY | Freq: Two times a day (BID) | RESPIRATORY_TRACT | Status: DC
  Administered 2017-02-21 – 2017-02-24 (×6): 2 via RESPIRATORY_TRACT
  Filled 2017-02-21 (×2): qty 8.8

## 2017-02-21 MED ORDER — LEVOFLOXACIN IN D5W 500 MG/100ML IV SOLN
500.0000 mg | INTRAVENOUS | Status: AC
  Administered 2017-02-23 – 2017-02-25 (×2): 500 mg via INTRAVENOUS
  Filled 2017-02-21 (×2): qty 100

## 2017-02-21 MED ORDER — IPRATROPIUM-ALBUTEROL 0.5-2.5 (3) MG/3ML IN SOLN: 3.0000 mL | RESPIRATORY_TRACT | Status: DC

## 2017-02-21 MED ORDER — FAMOTIDINE 20 MG PO TABS
20.0000 mg | ORAL_TABLET | Freq: Every day | ORAL | Status: DC
  Administered 2017-02-21 – 2017-02-24 (×4): 20 mg via ORAL
  Filled 2017-02-21 (×4): qty 1

## 2017-02-21 MED ORDER — GI COCKTAIL ~~LOC~~: 30.0000 mL | Freq: Four times a day (QID) | ORAL | Status: DC | PRN

## 2017-02-21 MED ORDER — SODIUM CHLORIDE 0.9 % IV BOLUS (SEPSIS)
500.0000 mL | Freq: Once | INTRAVENOUS | Status: AC
  Administered 2017-02-21: 500 mL via INTRAVENOUS

## 2017-02-21 MED ORDER — IPRATROPIUM-ALBUTEROL 0.5-2.5 (3) MG/3ML IN SOLN
3.0000 mL | Freq: Four times a day (QID) | RESPIRATORY_TRACT | Status: DC
  Administered 2017-02-21 – 2017-02-26 (×18): 3 mL via RESPIRATORY_TRACT
  Filled 2017-02-21 (×19): qty 3

## 2017-02-21 NOTE — Progress Notes (Signed)
Pharmacy Antibiotic Note Richard [name] is a 81 y.o. male admitted on 02/23/2017 with surgical prophylaxis.  Pharmacy has been consulted for COPD dosing.  Plan: 1. Levaquin 750 mg x 1 followed by 500 mg IV every 48 hours for 5 days total of therapy  2. Will follow peripherally   Height: 5\' 7"  (170.2 cm) Weight: 95 lb (43.1 kg) IBW/kg (Calculated) : 66.1  Temp (24hrs), Avg:97.9 F (36.6 C), Min:97.9 F (36.6 C), Max:97.9 F (36.6 C)   Recent Labs Lab 02/17/17 1712 02/20/17 1301 02/24/2017 1038  WBC 9.6 6.9 6.1  CREATININE 2.00* 1.85* 2.07*    Estimated Creatinine Clearance: 17.9 mL/min (A) (by C-G formula based on SCr of 2.07 mg/dL (H)).    No Known Allergies   Thank you for allowing pharmacy to be a part of this patient's care.  Vincenza Hews, PharmD, BCPS 02/18/2017, 2:03 PM

## 2017-02-21 NOTE — ED Triage Notes (Signed)
Pt brought in by EMS due to having chest pain and SOB. Pt received 10mg  of albuterol, 0.5mg  of atrovent, 125mg  of solumedrol. Per pt, pt has been coughing up yellow sputum.

## 2017-02-21 NOTE — Telephone Encounter (Signed)
Per Dr. Sarajane Jews okay to give verbal orders. I spoke with Amber and gave okay.

## 2017-02-21 NOTE — Progress Notes (Signed)
Report received from Gwenith Daily, RN, will await for pt. arrival to [facility].  Alphonzo Lemmings, RN

## 2017-02-21 NOTE — ED Notes (Signed)
Attempted report to [facility]. 

## 2017-02-21 NOTE — H&P (Signed)
History and Physical    Richard [name] CZY:[id number] DOB: [birth year] DOA: 02/19/2017  PCP: [name], MD Patient coming from: home  Chief Complaint: SOB/DOE  HPI: Richard [name] is a 81 y.o. male with medical history significant of Wegener's granulomatosis, PUD and esophagitis, hypertension, hiatal hernia, diverticulosis, COPD, BPH, CAD status post cardiac catheterization and stent placement. Patient presented with multiple generalized and focal complaints all likely related to cardiopulmonary disease.   Dyspnea on exertion. Present for approximately the last 6 weeks. Gradual worsening. Over the last week patient notes that he is become significantly more worse. Now he is unable to ambulate even from bed to bathroom without becoming severely short of breath. Some relief with O2 administered in the ED and breathing treatments. Occasional productive sputum reported. Patient started on Levaquin one day ago by pulmonologist, Dr. [name]. Patient has not noted any significant improvement since starting the Levaquin. Denies fevers. Reports normal ambulatory O2 saturation during that appointment.  Chest pain. Described as severe pressure like sensation in the center of his chest. Lasted 4-5 minutes. Associated with generalized body aches during episode. Breasts with improvement. Patient takes aspirin every morning but did not take additional aspirin or nitroglycerin for this episode. A straight catheterization August 2015 with stent placement. Last cardiac appointment several months ago without any reported changes by the patient.   Chronic back pain. At baseline. Typically sees Dr. Cyndy Freeze in the outpatient setting and is scheduled for an MRI for further evaluation of chronic condition.  Generalized weakness and fatigue. Ongoing for the last 4 weeks or so. Somewhat intermittent but now constant. Associated with lack of desire to eat. Some loss of taste in food. Occasionally involves nausea when  eating. Overriding symptom though remains patient's lack of interest in eating.   ED Course: Objective findings outlined below. Albuterol and O2 administered with improvement in symptoms. 500 mL bolus given as well.  Review of Systems: As per HPI otherwise all other systems reviewed and are negative  Ambulatory Status: Severely limited due to deconditioning and hypoxemia.  Past Medical History:  Diagnosis Date  . Adenomatous colon polyp   . Anemia   . Anemia   . Asthma   . BPH (benign prostatic hyperplasia)    sees Dr. Risa Grill, biopsy June 2015 was benign   . Bronchiectasis ([place])   . CAD (coronary artery disease)    a. [place] s/p [place] x3 and ultimately BMS to pLAD (vision BMS 3.0 x 18), POBA to CTO of Cx-OM 03/17/14 and staged Rotablator-PCI w/ BMS (Mini Vision 2.5 x 28) to mRCA on 03/21/14  . Chronic bronchitis ([place])   . COPD (chronic obstructive pulmonary disease) ([place])   . Diverticulosis   . Femoral artery pseudo-aneurysm, right ([place])    a. s/p repair. Follow up with Dr. Kellie Simmering   . Fungal infection    lungs  . GERD (gastroesophageal reflux disease)   . Hiatal hernia   . History of blood transfusion 07/2016; 11/01/2016   "low blood; low blood"  . History of kidney stones   . HOH (hard of hearing)    bilaterally  . HTN (hypertension) 09/28/2013  . Moderate aortic stenosis by prior echocardiogram 07/08/2016   Echo 10/2015: Now Moderate AD (AVA ~1.2 cm) EF 60-65%. GR 1 DD. Echo 11/03/16:EF 60-65% with moderate LVH. Only noted grade 1 diastolic dysfunction. Moderate AS with moderate calcification and thickening. (AVA ~1.2-1.3 cm)  . Peptic stricture of esophagus   . Pneumonia    "1-2 times" (  11/01/2016)  . Wegener's granulomatosis ([place])    sees Dr. [name] ; "went from my lungs to my kidneys" (11/01/2016)    Past Surgical History:  Procedure Laterality Date  . CARDIAC CATHETERIZATION  03/17/2014   Procedure: CORONARY BALLOON ANGIOPLASTY;  Surgeon: Troy Sine, MD;  Location: [facility] CATH  LAB;  Service: Cardiovascular;;  . CATARACT EXTRACTION W/ INTRAOCULAR LENS  IMPLANT, BILATERAL  12/18/2012  . CHOLECYSTECTOMY N/A 12/21/2012   Procedure: LAPAROSCOPIC CHOLECYSTECTOMY WITH INTRAOPERATIVE CHOLANGIOGRAM;  Surgeon: Edward Jolly, MD;  Location: [facility] ORS;  Service: General;  Laterality: N/A;  . COLONOSCOPY  08-16-11   per Dr. Fuller Plan, diverticulosis and polyps, repeat in 5 yrs   . COLONOSCOPY N/A 08/01/2016   Procedure: COLONOSCOPY;  Surgeon: Carol Ada, MD;  Location: [facility] ENDOSCOPY;  Service: Endoscopy;  Laterality: N/A;  . ESOPHAGOGASTRODUODENOSCOPY N/A 07/31/2016   Procedure: ESOPHAGOGASTRODUODENOSCOPY (EGD);  Surgeon: Carol Ada, MD;  Location: [facility] END[facility];  Service: Endoscopy;  Laterality: N/A;  . ESOPHAGOGASTRODUODENOSCOPY (EGD) WITH ESOPHAGEAL DILATION  11-29-10   per Dr. Fuller Plan   . HEMATOMA EVACUATION Right 03/19/2014   Procedure: Suture repair of femoral artery with evacuation of hematoma;  Surgeon: Mal Misty, MD;  Location: [facility];  Service: Vascular;  Laterality: Right;  . INGUINAL HERNIA REPAIR Right ~ 2002  . LEFT HEART CATHETERIZATION WITH CORONARY ANGIOGRAM N/A 03/14/2014   Procedure: LEFT HEART CATHETERIZATION WITH CORONARY ANGIOGRAM;  Surgeon: Leonie Man, MD;  Location: [facility] CATH LAB: pLAD 90% calcified lesion --> after D1, LAD is Subtotally occluded. mCx 100% (L-L collaterals). pRI 60-80%. mRCA diffuse 80-95%.  Marland Kitchen LUNG BIOPSY  03/2014  . PERCUTANEOUS CORONARY STENT INTERVENTION (PCI-S) N/A 03/17/2014   Procedure: PERCUTANEOUS CORONARY STENT INTERVENTION (PCI-S);  Surgeon: Troy Sine, MD;  Location: [facility] CATH LAB: Vision BMS 3.0 mm x 18 mm -- 3.2 mm); PTCA of CTO Cx-OM  . PERCUTANEOUS CORONARY STENT INTERVENTION (PCI-S) N/A 03/21/2014   Procedure: PERCUTANEOUS CORONARY STENT INTERVENTION (PCI-S);  Surgeon: Burnell Blanks, MD;  Location: [facility] CATH LAB:  Rotational Atherectomy -- Mini [place] BMS 2.5 x 28 mm --> 2.75 mm)  . RENAL BIOPSY    . TRANSTHORACIC  ECHOCARDIOGRAM  10/2015; 10/2016   a. Now Moderate AD (AVA ~1.2 cm) EF 60-65%. GR 1 DD.;; b. Stable -- EF 60-65% with moderate LVH. Only noted grade 1 diastolic dysfunction. Moderate aortic stenosis with moderate calcification and thickening. (Estimated valve area between 1.2-1.3 cm)    Social History   Social History  . Marital status: Married    Spouse name: N/A  . Number of children: N/A  . Years of education: N/A   Occupational History  . Retired    Social History Main Topics  . Smoking status: Never Smoker  . Smokeless tobacco: Never Used  . Alcohol use No  . Drug use: No  . Sexual activity: Not on file   Other Topics Concern  . Not on file   Social History Narrative  . No narrative on file    No Known Allergies  Family History  Problem Relation Age of Onset  . Asthma Father   . Coronary artery disease Mother   . Coronary artery disease Brother   . Coronary artery disease Brother   . Colon cancer Neg Hx   . Stomach cancer Neg Hx       Prior to Admission medications   Medication Sig Start Date End Date Taking? Authorizing Provider  acetaminophen (TYLENOL) 500 MG tablet Take 2 tablets (1,000  mg total) by mouth every 8 (eight) hours. Patient taking differently: Take 1,000 mg by mouth every 8 (eight) hours as needed for mild pain or moderate pain.  01/09/17   Eugenie Filler, MD  albuterol (VENTOLIN HFA) 108 (90 Base) MCG/ACT inhaler Inhale 1-2 puffs into the lungs every 4 (four) hours as needed for wheezing or shortness of breath. 07/12/16   Tanda Rockers, MD  aspirin EC 81 MG tablet Take 81 mg by mouth daily.    [provider]  b complex vitamins tablet Take 1 tablet by mouth daily.      [provider]  benzonatate (TESSALON) 200 MG capsule Take 1 capsule (200 mg total) by mouth 3 (three) times daily as needed for cough. 01/09/17   Eugenie Filler, MD  bisacodyl (DULCOLAX) 5 MG EC tablet Take 5 mg by mouth 2 (two) times daily.     [provider]  calcitRIOL (ROCALTROL) 0.25 MCG capsule Take 0.25 mcg by mouth daily.  09/06/15   [provider]  cyclophosphamide (CYTOXAN) 25 MG capsule Take 25 mg by mouth daily. Take one capsule by mouth daily on an empty stomach 1 hour before or 2 hours after meal. 07/26/16   [provider]  dextromethorphan (DELSYM) 30 MG/5ML liquid Take 30 mg by mouth at bedtime as needed for cough.    [provider]  finasteride (PROSCAR) 5 MG tablet Take 1 tablet (5 mg total) by mouth daily. 11/18/16   [name], MD  guaiFENesin-codeine 100-10 MG/5ML syrup Take 10 mLs by mouth every 6 (six) hours as needed for cough. 08/23/16   Eber Jones, MD  HYDROcodone-homatropine (HYDROMET) 5-1.5 MG/5ML syrup Take 5 mLs by mouth every 4 (four) hours as needed. Patient taking differently: Take 5 mLs by mouth every 4 (four) hours as needed for cough.  02/06/17   [name], MD  ipratropium-albuterol (DUONEB) 0.5-2.5 (3) MG/3ML SOLN Take 3 mLs by nebulization every 4 (four) hours as needed (if inhaler is not effective in resolving shortness of breath or wheezing). 02/03/16   Tanda Rockers, MD  levofloxacin (LEVAQUIN) 750 MG tablet TAKE ONE TABLET BY MOUTH ONCE DAILY AS  NEEDED  FOR  LUNGS 02/19/2017   Tanda Rockers, MD  loratadine (CLARITIN) 10 MG tablet Take 10 mg by mouth daily.     [provider]  Methylcellulose, Laxative, (CITRUCEL PO) Take 1 packet by mouth daily.    [provider]  metoprolol succinate (TOPROL-XL) 25 MG 24 hr tablet TAKE ONE TABLET BY MOUTH ONCE DAILY 01/26/17   Hilty, Nadean Corwin, MD  nitroGLYCERIN (NITROSTAT) 0.4 MG SL tablet Place 1 tablet (0.4 mg total) under the tongue every 5 (five) minutes x 3 doses as needed for chest pain. 03/24/15   Hilty, Nadean Corwin, MD  ondansetron (ZOFRAN) 4 MG tablet Take 1 tablet (4 mg total) by mouth every 8 (eight) hours as needed for nausea or vomiting. 01/10/17   [name], MD  oxyCODONE-acetaminophen  (PERCOCET) 10-325 MG tablet Take 1 tablet by mouth every 6 (six) hours as needed for pain. 12/22/16   [name], MD  predniSONE (DELTASONE) 20 MG tablet Take 20 mg by mouth daily with breakfast.    [provider]  ranitidine (ZANTAC) 300 MG tablet Take 1 tablet (300 mg total) by mouth 2 (two) times daily. 11/18/16   [name], MD  simethicone (MYLICON) 517 MG chewable tablet Chew 250 mg by mouth 2 (  two) times daily as needed for flatulence. TAKE PER BOX FOR GAS     [provider]  simvastatin (ZOCOR) 20 MG tablet TAKE ONE TABLET BY MOUTH ONCE DAILY AT  6  PM 07/07/16   Hilty, Nadean Corwin, MD  sodium bicarbonate 650 MG tablet Take 2 tablets (1,300 mg total) by mouth 2 (two) times daily. NEED OV. 08/12/15   Pixie Casino, MD  sodium chloride (OCEAN) 0.65 % SOLN nasal spray Place 1 spray into both nostrils as needed for congestion.    [provider]  SYMBICORT 160-4.5 MCG/ACT inhaler INHALE TWO PUFFS BY MOUTH TWICE DAILY 06/01/16   Tanda Rockers, MD  tamsulosin (FLOMAX) 0.4 MG CAPS capsule TAKE ONE CAPSULE BY MOUTH ONCE DAILY IN THE EVENING 01/13/17   [name], MD    Physical Exam: Vitals:   02/14/2017 1215 02/14/2017 1230 02/12/2017 1315 02/28/2017 1330  BP: 118/72 115/76 116/81 115/85  Pulse: 94 95 99 (!) 112  Resp: 18 20 (!) 27 (!) 28  Temp:      TempSrc:      SpO2: 100% 100% 99% 99%  Weight:      Height:         General: Frail and cachectic appearing, resting in bed Eyes:  PERRL, EOMI, normal lids, iris ENT:  grossly normal hearing, lips & tongue, mmm Neck:  no LAD, masses or thyromegaly Cardiovascular:  RRR, no m/r/g. No LE edema.  Respiratory: Intermittent rhonchi and wheezes. Increased effort. 85% on room air. Abdomen:  soft, ntnd, NABS Skin:  no rash or induration seen on limited exam Musculoskeletal:  grossly normal tone BUE/BLE, good ROM, no bony abnormality Psychiatric:  grossly normal mood and affect, speech fluent and appropriate,  AOx3 Neurologic:  CN 2-12 grossly intact, moves all extremities in coordinated fashion, sensation intact  Labs on Admission: I have personally reviewed following labs and imaging studies  CBC:  Recent Labs Lab 02/17/17 1712 02/20/17 1301 03/01/2017 1038  WBC 9.6 6.9 6.1  NEUTROABS 9.1* 5.9 4.3  HGB 10.0* 10.0* 9.9*  HCT 30.9* 29.7* 31.2*  MCV 110.8* 109.2* 108.7*  PLT 199 169.0 Platelet estimate appears normal. 939   Basic Metabolic Panel:  Recent Labs Lab 02/17/17 1712 02/20/17 1301 02/13/2017 1038  NA 142 140 141  K 5.5* 4.5 4.9  CL 107 104 106  CO2 25 28 24   GLUCOSE 200* 108* 107*  BUN 67* 59* 61*  CREATININE 2.00* 1.85* 2.07*  CALCIUM 8.8* 8.7 8.6*   GFR: Estimated Creatinine Clearance: 17.9 mL/min (A) (by C-G formula based on SCr of 2.07 mg/dL (H)). Liver Function Tests:  Recent Labs Lab 03/06/2017 1038  AST 15  ALT 12*  ALKPHOS 126  BILITOT 1.1  PROT 4.9*  ALBUMIN 2.4*    Recent Labs Lab 02/20/2017 1038  LIPASE 18   No results for input(s): AMMONIA in the last 168 hours. Coagulation Profile: No results for input(s): INR, PROTIME in the last 168 hours. Cardiac Enzymes:  Recent Labs Lab 03/06/2017 1038  TROPONINI <0.03   BNP (last 3 results) No results for input(s): PROBNP in the last 8760 hours. HbA1C: No results for input(s): HGBA1C in the last 72 hours. CBG: No results for input(s): GLUCAP in the last 168 hours. Lipid Profile: No results for input(s): CHOL, HDL, LDLCALC, TRIG, CHOLHDL, LDLDIRECT in the last 72 hours. Thyroid Function Tests: No results for input(s): TSH, T4TOTAL, FREET4, T3FREE, THYROIDAB in the last 72 hours. Anemia Panel: No results for input(s): VITAMINB12,  FOLATE, FERRITIN, TIBC, IRON, RETICCTPCT in the last 72 hours. Urine analysis:    Component Value Date/Time   COLORURINE YELLOW 03/05/2017 1309   APPEARANCEUR CLEAR 02/08/2017 1309   LABSPEC 1.016 02/18/2017 1309   PHURINE 6.0 02/22/2017 1309   GLUCOSEU NEGATIVE  02/19/2017 1309   GLUCOSEU NEGATIVE 07/26/2013 1142   HGBUR NEGATIVE 03/06/2017 1309   HGBUR trace-intact 11/13/2008 0000   BILIRUBINUR NEGATIVE 02/11/2017 1309   BILIRUBINUR neg 05/16/2012 1300   KETONESUR 5 (A) 02/19/2017 1309   PROTEINUR NEGATIVE 02/10/2017 1309   UROBILINOGEN 0.2 03/25/2014 1637   NITRITE NEGATIVE 03/05/2017 1309   LEUKOCYTESUR NEGATIVE 03/04/2017 1309    Creatinine Clearance: Estimated Creatinine Clearance: 17.9 mL/min (A) (by C-G formula based on SCr of 2.07 mg/dL (H)).  Sepsis Labs: @LABRCNTIP (procalcitonin:4,lacticidven:4) )No results found for this or any previous visit (from the past 240 hour(s)).   Radiological Exams on Admission: Dg Chest 2 View  Result Date: 03/05/2017 CLINICAL DATA:  Chest pain and shortness of breath for the past 3 weeks. EXAM: CHEST  2 VIEW COMPARISON:  02/20/2017. FINDINGS: Normal sized heart. The lungs remain hyperexpanded. Patchy opacity is again demonstrated throughout the majority of the right lung. The left lung is clear. The lungs remain hyperexpanded. Stable prominence of the interstitial markings in both lungs. Diffuse osteopenia. Stable marked T7 vertebral compression deformity. IMPRESSION: Stable changes of COPD with interstitial fibrosis. No acute abnormality. Electronically Signed   By: Claudie Revering M.D.   On: 02/23/2017 11:26   Dg Chest 2 View  Result Date: 02/20/2017 CLINICAL DATA:  81 y/o male with cough. Wegener granulomatosis with renal involvement. EXAM: CHEST  2 VIEW COMPARISON:  02/17/2017 and earlier, including thoracic spine MRI 12/30/2016 and chest CT 07/30/2016. FINDINGS: Chronic bronchiectasis with peribronchial thickening and large lung volumes. No superimposed pneumothorax or pleural effusion. No consolidation or acute pulmonary opacity identified. Stable cardiac size and mediastinal contours. Calcified aortic atherosclerosis. Chronic hiatal hernia. Visualized tracheal air column is within normal limits. Stable  cholecystectomy clips. Negative visible bowel gas pattern. T7 compression fracture again noted. Osteopenia. No acute osseous abnormality identified. IMPRESSION: 1. Chronic lung disease with bronchiectasis and peribronchial opacity appears stable since 01/27/2017. 2.  No superimposed acute findings are identified. Electronically Signed   By: Genevie Ann M.D.   On: 02/20/2017 13:23    EKG: Independently reviewed. Sinus. Unchanged from previous.   Assessment/Plan Active Problems:   Wegener's granulomatosis ([place])   Chronic kidney disease, stage IV (severe) (HCC)   Protein-calorie malnutrition, severe (HCC)   Hypotension   Acute on chronic respiratory failure (HCC)   Chest pressure   FTT (failure to thrive) in adult   Acute on chronic hypoxic respiratory failure: O2 saturations dropped to 85% while on room air. Does not use O2 at home. Started on Levaquin the day prior to admission, 02/20/2017, for exacerbation of long-standing bronchitis/COPD/Wegener's granulomatosis. Chest x-ray without evidence of acute process. Patient's long-standing steroid taper was not increased at his appointment with pulmonologist. - Continue Levaquin - Increase home prednisone to 50 mg - Mucinex - O2 when necessary - Case management for home O2 - ABG to establish baseline.  - continue tessalon, hycodan - continue Dulera  Chest pressure: Suspect this is primarily due to chronic hypoxemia but cannot rule out intermittent ischemic cardiomyopathy. EKG unremarkable and troponin normal. Last cardiac catheterization 03/2014 with stent placement. Patient is not a CABG candidate. Further cardiac catheterization is tenuous given worsening renal function. Discussed case w/ on call cardiologist who agrees  w/ plan as below.  - Cycle trop - EKG in am  - Cards consult if condition worsens  - outpt f/u  - Continue ASA, statin  Wegeners granulomatosis: Advanced. Followed by Pulm and Nephrology - continue Cytoxan  CKD: Creatinine  2. At baseline. Secondary to Wegener's granulomatosis - Continue renal vitamins  Failure to thrive/physical deconditioning/protein calorie malnutrition: - Nutrition consult - Consider surgical consult pending nutritional evaluation for possible feeding tube though my initial take on this was that it would likely negatively back patient and increased mortality and morbidity - Pre-albumin - PT/OT  PUD: - continue Zantac  HTN: currently hypotensive likely from poor oral intake in the setting of antyhypertensive use.  - hold metop  BPH: - continue Flomax, Proscar:  DVT prophylaxis: Hep  Code Status: full  Family Communication: wife and daughter  Disposition Plan: pending improvement in respiratory status and ACS r/o Consults called: none  Admission status: inpatient    Daaiyah Baumert J MD [facility]  If 7PM-7AM, please contact night-coverage [email or web address] Password [email or web address]  02/15/2017, 1:56 PM

## 2017-02-21 NOTE — ED Provider Notes (Signed)
[facility] DEPT Provider Note   CSN: [id number] Arrival date & time: 02/20/2017  1007     History   Chief Complaint Chief Complaint  Patient presents with  . Chest Pain  . Shortness of Breath    HPI Richard [name] is a 81 y.o. male.  HPI   Pt with hx Wegener's, COPD,asthma, HTN, CAD with multiple stents, bronchiectasis p/w increased SOB, episode of CP that occurred with walking to the bathroom this morning at 9am.  Has had worsening symptoms of exertional dyspnea and chest pain over the past month, today was much more intense.   Also notes he has had little appetite for the past 3 days, associated nausea, has eaten very little and feels dehydrated, generalized weakness.  Cough is productive of yellow sputum, as usual.  Was seen by Dr [name], pulmonologist, yesterday and started on Levaquin.  He continues on daily prednisone, currently 20mg .  Was given duoneb and solumedrol by EMS.    Past Medical History:  Diagnosis Date  . Adenomatous colon polyp   . Anemia   . Anemia   . Asthma   . BPH (benign prostatic hyperplasia)    sees Dr. Risa Grill, biopsy June 2015 was benign   . Bronchiectasis ([place])   . CAD (coronary artery disease)    a. [place] s/p [place] x3 and ultimately BMS to pLAD (vision BMS 3.0 x 18), POBA to CTO of Cx-OM 03/17/14 and staged Rotablator-PCI w/ BMS (Mini Vision 2.5 x 28) to mRCA on 03/21/14  . Chronic bronchitis ([place])   . COPD (chronic obstructive pulmonary disease) ([place])   . Diverticulosis   . Femoral artery pseudo-aneurysm, right ([place])    a. s/p repair. Follow up with Dr. Kellie Simmering   . Fungal infection    lungs  . GERD (gastroesophageal reflux disease)   . Hiatal hernia   . History of blood transfusion 07/2016; 11/01/2016   "low blood; low blood"  . History of kidney stones   . HOH (hard of hearing)    bilaterally  . HTN (hypertension) 09/28/2013  . Moderate aortic stenosis by prior echocardiogram 07/08/2016   Echo 10/2015: Now Moderate AD (AVA ~1.2 cm) EF 60-65%.  GR 1 DD. Echo 11/03/16:EF 60-65% with moderate LVH. Only noted grade 1 diastolic dysfunction. Moderate AS with moderate calcification and thickening. (AVA ~1.2-1.3 cm)  . Peptic stricture of esophagus   . Pneumonia    "1-2 times" (11/01/2016)  . Wegener's granulomatosis ([place])    sees Dr. [name] ; "went from my lungs to my kidneys" (11/01/2016)    Patient Active Problem List   Diagnosis Date Noted  . Acute on chronic respiratory failure ([place]) 02/14/2017  . Chest pressure 03/02/2017  . FTT (failure to thrive) in adult 03/03/2017  . Anemia, chronic disease   . Dehydration   . HCAP (healthcare-associated pneumonia)   . Sepsis ([place]) 01/05/2017  . Lobar pneumonia ([place]) 01/05/2017  . Compression fracture of thoracic spine, non-traumatic ([place]) 01/05/2017  . AKI (acute kidney injury) ([place]) 01/05/2017  . Hypotension 01/05/2017  . CAD in native artery 11/14/2016  . Melena 11/01/2016  . Malnutrition of moderate degree 08/22/2016  . Influenza A 08/21/2016  . AVM (arteriovenous malformation) of colon with hemorrhage   . Symptomatic anemia 07/30/2016  . Gastrointestinal hemorrhage with melena 07/30/2016  . Aortic valve stenosis 07/08/2016  . Protein-calorie malnutrition, severe ([place]) 04/21/2015  . CAP (community acquired pneumonia) 04/18/2015  . Chronic kidney disease, stage IV (severe) ([place]) 03/26/2015  . GERD (gastroesophageal reflux  disease) 08/20/2014  . Wegener's granulomatosis with renal involvement ([place]) 07/15/2014  . Pseudoaneurysm of right femoral artery ([place]) 04/08/2014  . Acute blood loss anemia 03/20/2014  . CAD- severe 4 V CAD- not CABG candidate: BMS-pLAD, mRCA &PTCA of 100% Cx-OM 03/17/2014  . Anemia 03/17/2014  . CKD (chronic kidney disease), stage III 03/17/2014  . Cardiomyopathy, ischemic- EF 40% by [facility] 03/17/2014  . Dyspnea 03/13/2014  . Abnormal nuclear stress test: Intermediate risk with moderate region of apical and inferior scar with mild superimposed ischemia; EF 40%  03/12/2014  . Loss of weight 12/12/2013  . Abdominal pain, unspecified site 12/12/2013  . SVT (supraventricular tachycardia) ([place]) 10/04/2013  . SOB (shortness of breath) 10/04/2013  . Weakness generalized 10/04/2013  . Hypertension 09/28/2013  . Chronic rhinitis 09/27/2013  . Chronic respiratory failure ([place]) 12/26/2012  . Bronchiectasis with acute exacerbation ([place]) 09/28/2011  . OTHER DYSPHAGIA 10/25/2010  . Wegener's granulomatosis ([place]) 05/10/2010  . GERD 03/10/2010  . ARTHRITIS, CERVICAL SPINE 03/10/2010  . Hypothyroidism 11/13/2008  . VITAMIN D DEFICIENCY 11/13/2008  . Hyperlipidemia with target low density lipoprotein (LDL) cholesterol less than 70 mg/dL 11/13/2008  . ANEMIA 11/13/2008  . BPH (benign prostatic hyperplasia) 12/04/2007  . Obstructive bronchiectasis ([place]) 01/25/2007    Past Surgical History:  Procedure Laterality Date  . CARDIAC CATHETERIZATION  03/17/2014   Procedure: CORONARY BALLOON ANGIOPLASTY;  Surgeon: Troy Sine, MD;  Location: [facility] CATH LAB;  Service: Cardiovascular;;  . CATARACT EXTRACTION W/ INTRAOCULAR LENS  IMPLANT, BILATERAL  12/18/2012  . CHOLECYSTECTOMY N/A 12/21/2012   Procedure: LAPAROSCOPIC CHOLECYSTECTOMY WITH INTRAOPERATIVE CHOLANGIOGRAM;  Surgeon: Edward Jolly, MD;  Location: [facility] ORS;  Service: General;  Laterality: N/A;  . COLONOSCOPY  08-16-11   per Dr. Fuller Plan, diverticulosis and polyps, repeat in 5 yrs   . COLONOSCOPY N/A 08/01/2016   Procedure: COLONOSCOPY;  Surgeon: Carol Ada, MD;  Location: [facility] ENDOSCOPY;  Service: Endoscopy;  Laterality: N/A;  . ESOPHAGOGASTRODUODENOSCOPY N/A 07/31/2016   Procedure: ESOPHAGOGASTRODUODENOSCOPY (EGD);  Surgeon: Carol Ada, MD;  Location: [facility];  Service: Endoscopy;  Laterality: N/A;  . ESOPHAGOGASTRODUODENOSCOPY (EGD) WITH ESOPHAGEAL DILATION  11-29-10   per Dr. Fuller Plan   . HEMATOMA EVACUATION Right 03/19/2014   Procedure: Suture repair of femoral artery with evacuation of hematoma;   Surgeon: Mal Misty, MD;  Location: [facility];  Service: Vascular;  Laterality: Right;  . INGUINAL HERNIA REPAIR Right ~ 2002  . LEFT HEART CATHETERIZATION WITH CORONARY ANGIOGRAM N/A 03/14/2014   Procedure: LEFT HEART CATHETERIZATION WITH CORONARY ANGIOGRAM;  Surgeon: Leonie Man, MD;  Location: [facility] CATH LAB: pLAD 90% calcified lesion --> after D1, LAD is Subtotally occluded. mCx 100% (L-L collaterals). pRI 60-80%. mRCA diffuse 80-95%.  Marland Kitchen LUNG BIOPSY  03/2014  . PERCUTANEOUS CORONARY STENT INTERVENTION (PCI-S) N/A 03/17/2014   Procedure: PERCUTANEOUS CORONARY STENT INTERVENTION (PCI-S);  Surgeon: Troy Sine, MD;  Location: [facility] CATH LAB: Vision BMS 3.0 mm x 18 mm -- 3.2 mm); PTCA of CTO Cx-OM  . PERCUTANEOUS CORONARY STENT INTERVENTION (PCI-S) N/A 03/21/2014   Procedure: PERCUTANEOUS CORONARY STENT INTERVENTION (PCI-S);  Surgeon: Burnell Blanks, MD;  Location: [facility] CATH LAB:  Rotational Atherectomy -- Mini Trek BMS 2.5 x 28 mm --> 2.75 mm)  . RENAL BIOPSY    . TRANSTHORACIC ECHOCARDIOGRAM  10/2015; 10/2016   a. Now Moderate AD (AVA ~1.2 cm) EF 60-65%. GR 1 DD.;; b. Stable -- EF 60-65% with moderate LVH. Only noted grade 1 diastolic dysfunction. Moderate aortic stenosis with  moderate calcification and thickening. (Estimated valve area between 1.2-1.3 cm)       Home Medications    Prior to Admission medications   Medication Sig Start Date End Date Taking? Authorizing Provider  acetaminophen (TYLENOL) 500 MG tablet Take 2 tablets (1,000 mg total) by mouth every 8 (eight) hours. Patient taking differently: Take 1,000 mg by mouth every 8 (eight) hours as needed for mild pain or moderate pain.  01/09/17  Yes Eugenie Filler, MD  albuterol (VENTOLIN HFA) 108 (90 Base) MCG/ACT inhaler Inhale 1-2 puffs into the lungs every 4 (four) hours as needed for wheezing or shortness of breath. 07/12/16  Yes Tanda Rockers, MD  aspirin EC 81 MG tablet Take 81 mg by mouth daily.   Yes [provider]  b complex vitamins tablet Take 1 tablet by mouth daily.     Yes [provider]  benzonatate (TESSALON) 200 MG capsule Take 1 capsule (200 mg total) by mouth 3 (three) times daily as needed for cough. 01/09/17  Yes Eugenie Filler, MD  bisacodyl (DULCOLAX) 5 MG EC tablet Take 5 mg by mouth 2 (two) times daily.    Yes [provider]  calcitRIOL (ROCALTROL) 0.25 MCG capsule Take 0.25 mcg by mouth daily.  09/06/15  Yes [provider]  cyclophosphamide (CYTOXAN) 25 MG capsule Take 25 mg by mouth daily. Take one capsule by mouth daily on an empty stomach 1 hour before or 2 hours after meal. 07/26/16  Yes [provider]  dextromethorphan (DELSYM) 30 MG/5ML liquid Take 30 mg by mouth at bedtime as needed for cough.   Yes [provider]  finasteride (PROSCAR) 5 MG tablet Take 1 tablet (5 mg total) by mouth daily. 11/18/16  Yes [name], MD  guaiFENesin-codeine 100-10 MG/5ML syrup Take 10 mLs by mouth every 6 (six) hours as needed for cough. 08/23/16  Yes Eber Jones, MD  HYDROcodone-homatropine (HYDROMET) 5-1.5 MG/5ML syrup Take 5 mLs by mouth every 4 (four) hours as needed. Patient taking differently: Take 5 mLs by mouth every 4 (four) hours as needed for cough.  02/06/17  Yes [name], MD  ipratropium-albuterol (DUONEB) 0.5-2.5 (3) MG/3ML SOLN Take 3 mLs by nebulization every 4 (four) hours as needed (if inhaler is not effective in resolving shortness of breath or wheezing). 02/03/16  Yes Tanda Rockers, MD  levofloxacin (LEVAQUIN) 750 MG tablet TAKE ONE TABLET BY MOUTH ONCE DAILY AS  NEEDED  FOR  LUNGS 03/03/2017  Yes Tanda Rockers, MD  loratadine (CLARITIN) 10 MG tablet Take 10 mg by mouth daily.    Yes [provider]  Methylcellulose, Laxative, (CITRUCEL PO) Take 1 packet by mouth daily.   Yes [provider]  metoprolol succinate (TOPROL-XL) 25 MG 24 hr tablet TAKE ONE TABLET BY MOUTH ONCE DAILY 01/26/17   Yes Hilty, Nadean Corwin, MD  ondansetron (ZOFRAN) 4 MG tablet Take 1 tablet (4 mg total) by mouth every 8 (eight) hours as needed for nausea or vomiting. 01/10/17  Yes [name], MD  oxyCODONE-acetaminophen (PERCOCET) 10-325 MG tablet Take 1 tablet by mouth every 6 (six) hours as needed for pain. 12/22/16  Yes [name], MD  predniSONE (DELTASONE) 20 MG tablet Take 20 mg by mouth daily with breakfast.   Yes [provider]  ranitidine (ZANTAC) 300 MG tablet Take 1 tablet (300 mg total) by mouth 2 (two) times daily. 11/18/16  Yes [name], MD  simethicone (  MYLICON) 784 MG chewable tablet Chew 250 mg by mouth 2 (two) times daily as needed for flatulence. TAKE PER BOX FOR GAS    Yes [provider]  simvastatin (ZOCOR) 20 MG tablet TAKE ONE TABLET BY MOUTH ONCE DAILY AT  6  PM 07/07/16  Yes Hilty, Nadean Corwin, MD  sodium bicarbonate 650 MG tablet Take 2 tablets (1,300 mg total) by mouth 2 (two) times daily. NEED OV. 08/12/15  Yes Hilty, Nadean Corwin, MD  sodium chloride (OCEAN) 0.65 % SOLN nasal spray Place 1 spray into both nostrils as needed for congestion.   Yes [provider]  SYMBICORT 160-4.5 MCG/ACT inhaler INHALE TWO PUFFS BY MOUTH TWICE DAILY 06/01/16  Yes Tanda Rockers, MD  tamsulosin (FLOMAX) 0.4 MG CAPS capsule TAKE ONE CAPSULE BY MOUTH ONCE DAILY IN THE EVENING 01/13/17  Yes [name], MD  nitroGLYCERIN (NITROSTAT) 0.4 MG SL tablet Place 1 tablet (0.4 mg total) under the tongue every 5 (five) minutes x 3 doses as needed for chest pain. 03/24/15   Hilty, Nadean Corwin, MD    Family History Family History  Problem Relation Age of Onset  . Asthma Father   . Coronary artery disease Mother   . Coronary artery disease Brother   . Coronary artery disease Brother   . Colon cancer Neg Hx   . Stomach cancer Neg Hx     Social History Social History  Substance Use Topics  . Smoking status: Never Smoker  . Smokeless tobacco: Never Used  . Alcohol use No      Allergies   Patient has no known allergies.   Review of Systems Review of Systems  All other systems reviewed and are negative.    Physical Exam Updated Vital Signs BP 102/77   Pulse 94   Temp 97.9 F (36.6 C) (Oral)   Resp (!) 21   Ht 5\' 7"  (1.702 m)   Wt 43.1 kg (95 lb)   SpO2 100%   BMI 14.88 kg/m   Physical Exam  Constitutional: No distress.  Thin, frail  HENT:  Head: Normocephalic and atraumatic.  Neck: Neck supple.  Cardiovascular: Normal rate and regular rhythm.   Pulmonary/Chest: Accessory muscle usage present. No respiratory distress. He has wheezes. He has rales.  Abdominal: Soft. He exhibits no distension and no mass. There is no tenderness. There is no rebound and no guarding.  Musculoskeletal: He exhibits no edema.  Neurological: He is alert. He exhibits normal muscle tone.  Skin: He is not diaphoretic.  Nursing note and vitals reviewed.    ED Treatments / Results  Labs (all labs ordered are listed, but only abnormal results are displayed) Labs Reviewed  COMPREHENSIVE METABOLIC PANEL - Abnormal; Notable for the following:       Result Value   Glucose, Bld 107 (*)    BUN 61 (*)    Creatinine, Ser 2.07 (*)    Calcium 8.6 (*)    Total Protein 4.9 (*)    Albumin 2.4 (*)    ALT 12 (*)    GFR calc non Af Amer 29 (*)    GFR calc Af Amer 34 (*)    All other components within normal limits  CBC WITH DIFFERENTIAL/PLATELET - Abnormal; Notable for the following:    RBC 2.87 (*)    Hemoglobin 9.9 (*)    HCT 31.2 (*)    MCV 108.7 (*)    [name] 34.5 (*)    RDW 17.8 (*)  All other components within normal limits  URINALYSIS, ROUTINE W REFLEX MICROSCOPIC - Abnormal; Notable for the following:    Ketones, ur 5 (*)    All other components within normal limits  PREALBUMIN - Abnormal; Notable for the following:    Prealbumin <5 (*)    All other components within normal limits  I-STAT ARTERIAL BLOOD GAS, ED - Abnormal; Notable for the following:     pCO2 arterial 30.5 (*)    Acid-base deficit 3.0 (*)    All other components within normal limits  LIPASE, BLOOD  TROPONIN I  TROPONIN I  TROPONIN I  TROPONIN I    EKG  EKG Interpretation  Date/Time:  Tuesday February 21 2017 10:21:50 EDT Ventricular Rate:  103 PR Interval:    QRS Duration: 93 QT Interval:  358 QTC Calculation: 469 R Axis:   25 Text Interpretation:  Sinus tachycardia Anteroseptal infarct, old No significant change since last tracing Confirmed by Gareth Morgan [phone]) on 02/24/2017 12:39:40 PM       Radiology Dg Chest 2 View  Result Date: 03/04/2017 CLINICAL DATA:  Chest pain and shortness of breath for the past 3 weeks. EXAM: CHEST  2 VIEW COMPARISON:  02/20/2017. FINDINGS: Normal sized heart. The lungs remain hyperexpanded. Patchy opacity is again demonstrated throughout the majority of the right lung. The left lung is clear. The lungs remain hyperexpanded. Stable prominence of the interstitial markings in both lungs. Diffuse osteopenia. Stable marked T7 vertebral compression deformity. IMPRESSION: Stable changes of COPD with interstitial fibrosis. No acute abnormality. Electronically Signed   By: Claudie Revering M.D.   On: 03/06/2017 11:26   Dg Chest 2 View  Result Date: 02/20/2017 CLINICAL DATA:  81 y/o male with cough. Wegener granulomatosis with renal involvement. EXAM: CHEST  2 VIEW COMPARISON:  02/17/2017 and earlier, including thoracic spine MRI 12/30/2016 and chest CT 07/30/2016. FINDINGS: Chronic bronchiectasis with peribronchial thickening and large lung volumes. No superimposed pneumothorax or pleural effusion. No consolidation or acute pulmonary opacity identified. Stable cardiac size and mediastinal contours. Calcified aortic atherosclerosis. Chronic hiatal hernia. Visualized tracheal air column is within normal limits. Stable cholecystectomy clips. Negative visible bowel gas pattern. T7 compression fracture again noted. Osteopenia. No acute osseous  abnormality identified. IMPRESSION: 1. Chronic lung disease with bronchiectasis and peribronchial opacity appears stable since 01/27/2017. 2.  No superimposed acute findings are identified. Electronically Signed   By: Genevie Ann M.D.   On: 02/20/2017 13:23    Procedures Procedures (including critical care time)  Medications Ordered in ED Medications  predniSONE (DELTASONE) tablet 50 mg (not administered)  HYDROcodone-homatropine (HYCODAN) 5-1.5 MG/5ML syrup 5 mL (not administered)  tamsulosin (FLOMAX) capsule 0.4 mg (not administered)  benzonatate (TESSALON) capsule 200 mg (not administered)  finasteride (PROSCAR) tablet 5 mg (not administered)  methylcellulose oral powder POWD 1 packet (not administered)  cyclophosphamide (CYTOXAN) capsule 25 mg (not administered)  aspirin EC tablet 81 mg (not administered)  famotidine (PEPCID) tablet 20 mg (not administered)  simvastatin (ZOCOR) tablet 20 mg (not administered)  mometasone-formoterol (DULERA) 200-5 MCG/ACT inhaler 2 puff (not administered)  calcitRIOL (ROCALTROL) capsule 0.25 mcg (not administered)  simethicone (MYLICON) chewable tablet 80 mg (not administered)  ipratropium-albuterol (DUONEB) 0.5-2.5 (3) MG/3ML nebulizer solution 3 mL (not administered)  nitroGLYCERIN (NITROSTAT) SL tablet 0.4 mg (not administered)  sodium bicarbonate tablet 1,300 mg (not administered)  heparin injection 5,000 Units (not administered)  0.9 %  sodium chloride infusion (not administered)  acetaminophen (TYLENOL) tablet 650 mg (not administered)  Or  acetaminophen (TYLENOL) suppository 650 mg (not administered)  ondansetron (ZOFRAN) tablet 4 mg (not administered)    Or  ondansetron (ZOFRAN) injection 4 mg (not administered)  gi cocktail (Maalox,Lidocaine,Donnatal) (not administered)  albuterol (PROVENTIL) (2.5 MG/3ML) 0.083% nebulizer solution 2.5 mg (not administered)  levofloxacin (LEVAQUIN) IVPB 750 mg (not administered)  levofloxacin (LEVAQUIN)  IVPB 500 mg (not administered)  oxyCODONE-acetaminophen (PERCOCET/ROXICET) 5-325 MG per tablet 1 tablet (not administered)    And  oxyCODONE (Oxy IR/ROXICODONE) immediate release tablet 5 mg (not administered)  ipratropium-albuterol (DUONEB) 0.5-2.5 (3) MG/3ML nebulizer solution 3 mL (3 mLs Nebulization Given 03/06/2017 1042)  sodium chloride 0.9 % bolus 500 mL (0 mLs Intravenous Stopped 02/28/2017 1405)     Initial Impression / Assessment and Plan / ED Course  I have reviewed the triage vital signs and the nursing notes.  Pertinent labs & imaging results that were available during my care of the patient were reviewed by me and considered in my medical decision making (see chart for details).  Clinical Course as of Feb 22 1547  Tue Feb 21, 2017  1201 Pt notes improvement after nebs.  Effort is improved.  Reexamination with slight wheeze, ronchi.    [EW]  1216 I spoke with Dr Marily Memos who will come to see the patient.    [EW]    Clinical Course User Index [EW] Clayton Bibles, [place]    Pt with hx wegener's disease with CKD, chronic lung disease, also with severe CAD with multiple stents placed in 2015, p/w exertional CP and SOB this morning.  Also with decreased appetite, decreased PO intake.  Initial workup reassuring, labs approximately baseline.  Pt discussed with Dr Billy Fischer.  Admitted to [facility], Dr Marily Memos accepting.    Final Clinical Impressions(s) / ED Diagnoses   Final diagnoses:  Exertional chest pain  Dyspnea on exertion    New Prescriptions New Prescriptions   No medications on file     Clayton Bibles, [place] 02/06/2017 1551    Gareth Morgan, MD 02/22/17 1355

## 2017-02-21 NOTE — ED Notes (Signed)
Pt given a urinal to provide a sample when able.

## 2017-02-21 NOTE — Telephone Encounter (Signed)
Per pt's result notes, Magda Paganini has already spoken to him about his results. Attempted to contact pt's daughter. Received a fast busy signal x2. Will try back.

## 2017-02-21 NOTE — Telephone Encounter (Signed)
Richard [name] needs verbal  Orders  for twice a wk for 3 wks and order for skill nurse visit

## 2017-02-21 NOTE — Progress Notes (Signed)
Richard [name] [id number] Admitted to 0S11: 02/09/2017 5:27 PM Attending Provider: Waldemar Dickens, MD    Richard [name] is a 81 y.o. male patient admitted from ED awake, alert  & orientated  X 3,  Full Code, VSS - Blood pressure 121/68, pulse 99, temperature 98.6 F (37 C), resp. rate 20, height 5\' 7"  (1.702 m), weight 40.8 kg (89 lb 14.4 oz), SpO2 99 %., O2    3 L nasal cannular, no c/o shortness of breath, no c/o chest pain, no distress noted. Tele # [phone] placed and pt is currently running:normal sinus rhythm.   IV site WDL:  antecubital left, condition patent and no redness with a transparent dsg that's clean dry and intact.  Allergies:  No Known Allergies   Past Medical History:  Diagnosis Date  . Adenomatous colon polyp   . Anemia   . Anemia   . Asthma   . BPH (benign prostatic hyperplasia)    sees Dr. Risa Grill, biopsy June 2015 was benign   . Bronchiectasis ([place])   . CAD (coronary artery disease)    a. [place] s/p [place] x3 and ultimately BMS to pLAD (vision BMS 3.0 x 18), POBA to CTO of Cx-OM 03/17/14 and staged Rotablator-PCI w/ BMS (Mini Vision 2.5 x 28) to mRCA on 03/21/14  . Chronic bronchitis ([place])   . COPD (chronic obstructive pulmonary disease) ([place])   . Diverticulosis   . Femoral artery pseudo-aneurysm, right ([place])    a. s/p repair. Follow up with Dr. Kellie Simmering   . Fungal infection    lungs  . GERD (gastroesophageal reflux disease)   . Hiatal hernia   . History of blood transfusion 07/2016; 11/01/2016   "low blood; low blood"  . History of kidney stones   . HOH (hard of hearing)    bilaterally  . HTN (hypertension) 09/28/2013  . Moderate aortic stenosis by prior echocardiogram 07/08/2016   Echo 10/2015: Now Moderate AD (AVA ~1.2 cm) EF 60-65%. GR 1 DD. Echo 11/03/16:EF 60-65% with moderate LVH. Only noted grade 1 diastolic dysfunction. Moderate AS with moderate calcification and thickening. (AVA ~1.2-1.3 cm)  . Peptic stricture of esophagus   . Pneumonia    "1-2 times"  (11/01/2016)  . Wegener's granulomatosis ([place])    sees Dr. [name] ; "went from my lungs to my kidneys" (11/01/2016)    History:  Will be obtained from the patient.  Pt orientation to unit, room and routine. Information packet given to patient/family and safety video watched.  Admission INP armband ID verified with patient/family, and in place. SR up x 2, fall risk assessment complete with Patient and family verbalizing understanding of risks associated with falls. Pt verbalizes an understanding of how to use the call bell and to call for help before getting out of bed.  Skin, clean-dry- intact with stage 1 to sacrum and bruising to bilat. arms, no skin tears.   No evidence of skin break down noted on exam.    Will cont to monitor and assist as needed.  Lindalou Hose, RN 02/11/2017 5:27 PM

## 2017-02-22 ENCOUNTER — Encounter (HOSPITAL_COMMUNITY): Payer: Self-pay | Admitting: Physician Assistant

## 2017-02-22 DIAGNOSIS — I480 Paroxysmal atrial fibrillation: Secondary | ICD-10-CM

## 2017-02-22 DIAGNOSIS — J9620 Acute and chronic respiratory failure, unspecified whether with hypoxia or hypercapnia: Secondary | ICD-10-CM

## 2017-02-22 DIAGNOSIS — L8990 Pressure ulcer of unspecified site, unspecified stage: Secondary | ICD-10-CM | POA: Insufficient documentation

## 2017-02-22 DIAGNOSIS — R627 Adult failure to thrive: Secondary | ICD-10-CM

## 2017-02-22 DIAGNOSIS — E43 Unspecified severe protein-calorie malnutrition: Secondary | ICD-10-CM

## 2017-02-22 DIAGNOSIS — R0789 Other chest pain: Secondary | ICD-10-CM

## 2017-02-22 DIAGNOSIS — R0609 Other forms of dyspnea: Secondary | ICD-10-CM

## 2017-02-22 LAB — BASIC METABOLIC PANEL
Anion gap: 8 (ref 5–15)
BUN: 57 mg/dL — AB (ref 6–20)
CO2: 24 mmol/L (ref 22–32)
CREATININE: 1.76 mg/dL — AB (ref 0.61–1.24)
Calcium: 8 mg/dL — ABNORMAL LOW (ref 8.9–10.3)
Chloride: 111 mmol/L (ref 101–111)
GFR calc Af Amer: 41 mL/min — ABNORMAL LOW (ref >60)
GFR, EST NON AFRICAN AMERICAN: 35 mL/min — AB (ref >60)
GLUCOSE: 117 mg/dL — AB (ref 65–99)
Potassium: 4.5 mmol/L (ref 3.5–5.1)
SODIUM: 143 mmol/L (ref 135–145)

## 2017-02-22 LAB — CBC
HCT: 26.3 % — ABNORMAL LOW (ref 39.0–52.0)
Hemoglobin: 8.3 g/dL — ABNORMAL LOW (ref 13.0–17.0)
MCH: 34.7 pg — AB (ref 26.0–34.0)
MCHC: 31.6 g/dL (ref 30.0–36.0)
MCV: 110 fL — ABNORMAL HIGH (ref 78.0–100.0)
PLATELETS: 116 10*3/uL — AB (ref 150–400)
RBC: 2.39 MIL/uL — ABNORMAL LOW (ref 4.22–5.81)
RDW: 18.4 % — AB (ref 11.5–15.5)
WBC: 4 10*3/uL (ref 4.0–10.5)

## 2017-02-22 LAB — T4, FREE: Free T4: 0.91 ng/dL (ref 0.61–1.12)

## 2017-02-22 LAB — TSH: TSH: 0.758 u[IU]/mL (ref 0.350–4.500)

## 2017-02-22 MED ORDER — FLUOXETINE HCL 20 MG PO CAPS
20.0000 mg | ORAL_CAPSULE | Freq: Every day | ORAL | Status: DC
  Filled 2017-02-22: qty 1

## 2017-02-22 MED ORDER — METOPROLOL SUCCINATE ER 25 MG PO TB24
12.5000 mg | ORAL_TABLET | Freq: Every day | ORAL | Status: DC
  Administered 2017-02-22 – 2017-02-24 (×3): 12.5 mg via ORAL
  Filled 2017-02-22 (×3): qty 1

## 2017-02-22 MED ORDER — DILTIAZEM HCL 25 MG/5ML IV SOLN
5.0000 mg | Freq: Once | INTRAVENOUS | Status: AC
  Administered 2017-02-22: 5 mg via INTRAVENOUS
  Filled 2017-02-22: qty 5

## 2017-02-22 MED ORDER — PAROXETINE HCL 20 MG PO TABS
20.0000 mg | ORAL_TABLET | Freq: Every day | ORAL | Status: DC
  Administered 2017-02-23 – 2017-03-05 (×10): 20 mg via ORAL
  Filled 2017-02-22 (×13): qty 1

## 2017-02-22 NOTE — Discharge Instructions (Signed)

## 2017-02-22 NOTE — Consult Note (Signed)
Cardiology Consultation:   Patient ID: Richard [name]; [id number]; 05/11/1936   Admit date: 02/25/2017 Date of Consult: 02/22/2017  Primary Care Provider: [name], MD Primary Cardiologist: Dr. Debara Pickett  Chief Complaint: shortness of breath  Patient Profile:   Richard [name] is a 81 y.o. male with a hx of Multivessel CAD (unsuitable for CABG in 2015 treated with staged multivessel PCI in 2015), ischemic cardiomyopathy (EF 40-45% in 2015, normalized from 2016 on), Wegener's granulomatosis, CKD stage IV, hyperkalemia, BPH, COPD, diverticulosis, anemia, hard of hearing, peptic stricture of esophagus, hypothyroidism, HLD, anemia, GIB 10/2016 requiring cessation of Plavix, moderate aortic stenosis whom we are asked to see for tachyarrhythmia, SOB, chest pain at the request of Dr. Wendee Beavers.  History of Present Illness:   He initially underwent cath 03/14/14 which revealed severe multivessel CAD involving all 4 major branches. He was seen by Dr. Servando Snare who deemed him unsuitable for CABG due to underlying comorbid conditions (sts score >30). It was elected to proceed with staged multivessel PCI. On 03/17/14 he had successful PCI to the LAD and POBA to a chronically occluded LCX. There remained high grade proximal disease to a large proximal Ramus branch as well as mid-RCA disease so staged PCI was planned at a later date due to pulmonary issues and renal dysfunction. This was further complicated by right femoral artery pseudoaneurysm requiring repair. He underwent PCI on 03/21/14 with rotablator atherectomy with BMS to the mid RCA. Due to length of fluoro time and large volume of contrast, it was decided to medically manage the ramus stenosis which Dr. Angelena Form felt was moderately stenosed and if he has recurrent anginal then consider PCI. Nuclear stress test in 03/2015 for increased SOB was low risk, "medium size, severe intensity, partially reversible distal anterior and apical defect consistent with  prior infarct and mild peri-infarct ischemia; study not gated due to ectopy; compared to previous, inferior perfusion has improved." At that time EF had actually normalized, whereas in 2016 it was 40-45%.  In 10/2016 he was admitted for suspected LGIB with symptomatic anemia - prior Colonoscopy December 2017 revealed diverticulosis, polyps, single nonbleeding colonic angiodysplastic lesion. GI did not plan any intervention. Cardiology consulted and recommended to continue aspirin only and discontinue Plavix. During that admission he had his most recent echo which showed mod LVH, EF 60-65%, grade 1 DD, moderate AS, mild MR. In 01/2017 he was admitted with weakness, hypotension, tachycardia, and sepsis due to HCAP and also found to have a Tspine compression fx. He required IV fluids.  He has been seen in the ED 6/22 as well as 7/13 for SOB. He saw his pulmonologist 02/20/17 with complaints of worsening dyspnea and yellow sputum cough. Levaquin was added. He [facility] with acutely worsened dyspnea as soon as he got up to go to the bathroom. He reports that he has been getting SOB with even minimal exertion lately. This is compounded by a sense of chest pressure that feels like it's "in his lungs" whenever his breathing gets acutely worse. He also reports substantial weakness, fatigue, lack of desire to eat, nausea when eating, generalized body aches, and weight loss (124lb end of last year, now 89lb). Workup has revealed Hgb 9.9->8.3, troponin neg x 4, Cr 2.07, albumin 2.4, prealbumin <5. CXR stable changes of COPD with interstitial fibrosis. Telemetry shows sinus tach with PACs but also brief episodes of what appear to be atrial flutter (at one point probably 2:1, then variable conduction). He's not acutely  aware of his palpitations. Denies any CP at rest. Was found to be hypoxic with new O2 requirement this admission.  Past Medical History:  Diagnosis Date  . Adenomatous colon polyp   . Anemia    . Asthma   . BPH (benign prostatic hyperplasia)    sees Dr. Risa Grill, biopsy June 2015 was benign   . Bronchiectasis ([place])   . CAD (coronary artery disease)    a. [place] s/p [place] x3 and ultimately BMS to pLAD (vision BMS 3.0 x 18), POBA to CTO of Cx-OM 03/17/14 and staged Rotablator-PCI w/ BMS (Mini Vision 2.5 x 28) to mRCA on 03/21/14 (unsuitable for CABG).  . Chronic bronchitis ([place])   . CKD (chronic kidney disease), stage IV ([place])   . COPD (chronic obstructive pulmonary disease) ([place])   . Diverticulosis   . Femoral artery pseudo-aneurysm, right ([place])    a. s/p repair. Follow up with Dr. Kellie Simmering   . Fungal infection    lungs  . GERD (gastroesophageal reflux disease)   . GI bleed    a. 10/2016 - Plavix stopped.  . Hiatal hernia   . History of blood transfusion 07/2016; 11/01/2016   "low blood; low blood"  . History of kidney stones   . HOH (hard of hearing)    bilaterally  . HTN (hypertension) 09/28/2013  . Ischemic cardiomyopathy    a. Prior EF 40-45% by echo in 2015, improved in 2016.  . Mild mitral regurgitation   . Moderate aortic stenosis by prior echocardiogram   . Peptic stricture of esophagus   . Pneumonia    "1-2 times" (11/01/2016)  . Wegener's granulomatosis ([place])    sees Dr. [name] ; "went from my lungs to my kidneys" (11/01/2016)    Past Surgical History:  Procedure Laterality Date  . CARDIAC CATHETERIZATION  03/17/2014   Procedure: CORONARY BALLOON ANGIOPLASTY;  Surgeon: Troy Sine, MD;  Location: [facility] CATH LAB;  Service: Cardiovascular;;  . CATARACT EXTRACTION W/ INTRAOCULAR LENS  IMPLANT, BILATERAL  12/18/2012  . CHOLECYSTECTOMY N/A 12/21/2012   Procedure: LAPAROSCOPIC CHOLECYSTECTOMY WITH INTRAOPERATIVE CHOLANGIOGRAM;  Surgeon: Edward Jolly, MD;  Location: [facility] ORS;  Service: General;  Laterality: N/A;  . COLONOSCOPY  08-16-11   per Dr. Fuller Plan, diverticulosis and polyps, repeat in 5 yrs   . COLONOSCOPY N/A 08/01/2016   Procedure: COLONOSCOPY;  Surgeon: Carol Ada, MD;  Location: [facility] ENDOSCOPY;  Service: Endoscopy;  Laterality: N/A;  . ESOPHAGOGASTRODUODENOSCOPY N/A 07/31/2016   Procedure: ESOPHAGOGASTRODUODENOSCOPY (EGD);  Surgeon: Carol Ada, MD;  Location: [facility];  Service: Endoscopy;  Laterality: N/A;  . ESOPHAGOGASTRODUODENOSCOPY (EGD) WITH ESOPHAGEAL DILATION  11-29-10   per Dr. Fuller Plan   . HEMATOMA EVACUATION Right 03/19/2014   Procedure: Suture repair of femoral artery with evacuation of hematoma;  Surgeon: Mal Misty, MD;  Location: [place];  Service: Vascular;  Laterality: Right;  . INGUINAL HERNIA REPAIR Right ~ 2002  . LEFT HEART CATHETERIZATION WITH CORONARY ANGIOGRAM N/A 03/14/2014   Procedure: LEFT HEART CATHETERIZATION WITH CORONARY ANGIOGRAM;  Surgeon: Leonie Man, MD;  Location: [facility] CATH LAB: pLAD 90% calcified lesion --> after D1, LAD is Subtotally occluded. mCx 100% (L-L collaterals). pRI 60-80%. mRCA diffuse 80-95%.  Marland Kitchen LUNG BIOPSY  03/2014  . PERCUTANEOUS CORONARY STENT INTERVENTION (PCI-S) N/A 03/17/2014   Procedure: PERCUTANEOUS CORONARY STENT INTERVENTION (PCI-S);  Surgeon: Troy Sine, MD;  Location: [facility] CATH LAB: Vision BMS 3.0 mm x 18 mm -- 3.2 mm); PTCA of CTO Cx-OM  . PERCUTANEOUS  CORONARY STENT INTERVENTION (PCI-S) N/A 03/21/2014   Procedure: PERCUTANEOUS CORONARY STENT INTERVENTION (PCI-S);  Surgeon: Burnell Blanks, MD;  Location: [facility] CATH LAB:  Rotational Atherectomy -- Mini [place] BMS 2.5 x 28 mm --> 2.75 mm)  . RENAL BIOPSY    . TRANSTHORACIC ECHOCARDIOGRAM  10/2015; 10/2016   a. Now Moderate AD (AVA ~1.2 cm) EF 60-65%. GR 1 DD.;; b. Stable -- EF 60-65% with moderate LVH. Only noted grade 1 diastolic dysfunction. Moderate aortic stenosis with moderate calcification and thickening. (Estimated valve area between 1.2-1.3 cm)     Inpatient Medications: Scheduled Meds: . aspirin EC  81 mg Oral Daily  . calcitRIOL  0.25 mcg Oral Daily  . cyclophosphamide  25 mg Oral Daily  . famotidine  20 mg Oral Daily  .  feeding supplement (ENSURE ENLIVE)  237 mL Oral BID BM  . finasteride  5 mg Oral Daily  . heparin  5,000 Units Subcutaneous Q8H  . ipratropium-albuterol  3 mL Nebulization Q6H  . mometasone-formoterol  2 puff Inhalation BID  . predniSONE  50 mg Oral Q breakfast  . psyllium  1 packet Oral Daily  . simvastatin  20 mg Oral q1800  . sodium bicarbonate  1,300 mg Oral BID  . tamsulosin  0.4 mg Oral Daily   Continuous Infusions: . sodium chloride 75 mL/hr at 02/22/17 0600  . [START ON 02/23/2017] levofloxacin (LEVAQUIN) IV    . levofloxacin (LEVAQUIN) IV     PRN Meds: acetaminophen **OR** acetaminophen, albuterol, benzonatate, gi cocktail, HYDROcodone-homatropine, nitroGLYCERIN, ondansetron **OR** ondansetron (ZOFRAN) IV, oxyCODONE-acetaminophen **AND** oxyCODONE, simethicone  Allergies:   No Known Allergies  Social History:   Social History   Social History  . Marital status: Married    Spouse name: N/A  . Number of children: N/A  . Years of education: N/A   Occupational History  . Retired    Social History Main Topics  . Smoking status: Never Smoker  . Smokeless tobacco: Never Used  . Alcohol use No  . Drug use: No  . Sexual activity: Not on file   Other Topics Concern  . Not on file   Social History Narrative  . No narrative on file    Family History:   The patient's family history includes Asthma in his father; Coronary artery disease in his brother, brother, and mother. There is no history of Colon cancer or Stomach cancer.  ROS:  Please see the history of present illness. All other ROS reviewed and negative.     Physical Exam/Data:   Vitals:   02/18/2017 2253 02/22/17 0310 02/22/17 0706 02/22/17 1433  BP: 118/76  124/80 (!) 108/57  Pulse: 95  (!) 101 88  Resp: 18  18 (!) 22  Temp: 98.3 F (36.8 C)  97.6 F (36.4 C) 98.3 F (36.8 C)  TempSrc:      SpO2: 100% 98% 100%   Weight:      Height:        Intake/Output Summary (Last 24 hours) at 02/22/17  1708 Last data filed at 02/22/17 1500  Gross per 24 hour  Intake           1687.5 ml  Output              500 ml  Net           1187.5 ml   Filed Weights   03/04/2017 1021 02/12/2017 1643  Weight: 95 lb (43.1 kg) 89 lb 14.4 oz (40.8 kg)   Body  mass index is 14.08 kg/m.  General: Cachetic appearing WM in no acute distress. Head: Normocephalic, atraumatic, sclera non-icteric, no xanthomas, nares are without discharge.  Neck: Negative for carotid bruits. JVD not elevated. Lungs: Diffusely loud coarse BS throughout. No wheezes or rhonchi. Breathing is unlabored. Heart: Mildly tachycardic, regular with occasional ectopy with S1 S2. Moderate higher pitched SEM heard best at RUSB but also able to be heard LSB as well Abdomen: Soft, non-tender, non-distended with normoactive bowel sounds. No hepatomegaly. No rebound/guarding. No obvious abdominal masses. Msk:  Strength and tone appear normal for age. Extremities: No clubbing or cyanosis. No edema.  Distal pedal pulses are 2+ and equal bilaterally. Diffuse ecchymosis Neuro: Alert and oriented X 3. No facial asymmetry. No focal deficit. Moves all extremities spontaneously. Psych:  Responds to questions appropriately with a normal affect.  EKG:  The EKG was personally reviewed and demonstrates sinus tach 122bpm prior inferior infarct, no acute ST-T changes  Relevant CV Studies: Summarized above  Laboratory Data:  Chemistry Recent Labs Lab 02/17/17 1712 02/20/17 1301 02/25/2017 1038 02/22/17 0348  NA 142 140 141 143  K 5.5* 4.5 4.9 4.5  CL 107 104 106 111  CO2 25 28 24 24   GLUCOSE 200* 108* 107* 117*  BUN 67* 59* 61* 57*  CREATININE 2.00* 1.85* 2.07* 1.76*  CALCIUM 8.8* 8.7 8.6* 8.0*  GFRNONAA 30*  --  29* 35*  GFRAA 35*  --  34* 41*  ANIONGAP 10  --  11 8     Recent Labs Lab 02/17/2017 1038  PROT 4.9*  ALBUMIN 2.4*  AST 15  ALT 12*  ALKPHOS 126  BILITOT 1.1   Hematology Recent Labs Lab 02/17/17 1712 02/20/17 1301  02/26/2017 1038 02/22/17 0348  WBC 9.6 6.9 6.1 4.0  RBC 2.79* 2.72 Repeated and verified X2.* 2.87* 2.39*  HGB 10.0* 10.0* 9.9* 8.3*  HCT 30.9* 29.7* 31.2* 26.3*  MCV 110.8* 109.2* 108.7* 110.0*  [name] 35.8*  --  34.5* 34.7*  MCHC 32.4 33.6 31.7 31.6  RDW 19.2* 20.2* 17.8* 18.4*  PLT 199 169.0 Platelet estimate appears normal. 153 116*   Cardiac Enzymes Recent Labs Lab 02/26/2017 1038 02/20/2017 1403 02/06/2017 1646 02/28/2017 1930  TROPONINI <0.03 <0.03 <0.03 <0.03   No results for input(s): TROPIPOC in the last 168 hours.  BNPNo results for input(s): BNP, PROBNP in the last 168 hours.  DDimer No results for input(s): DDIMER in the last 168 hours.  Radiology/Studies:  Dg Chest 2 View  Result Date: 02/20/2017 CLINICAL DATA:  Chest pain and shortness of breath for the past 3 weeks. EXAM: CHEST  2 VIEW COMPARISON:  02/20/2017. FINDINGS: Normal sized heart. The lungs remain hyperexpanded. Patchy opacity is again demonstrated throughout the majority of the right lung. The left lung is clear. The lungs remain hyperexpanded. Stable prominence of the interstitial markings in both lungs. Diffuse osteopenia. Stable marked T7 vertebral compression deformity. IMPRESSION: Stable changes of COPD with interstitial fibrosis. No acute abnormality. Electronically Signed   By: Claudie Revering M.D.   On: 02/24/2017 11:26   Dg Chest 2 View  Result Date: 02/20/2017 CLINICAL DATA:  81 y/o male with cough. Wegener granulomatosis with renal involvement. EXAM: CHEST  2 VIEW COMPARISON:  02/17/2017 and earlier, including thoracic spine MRI 12/30/2016 and chest CT 07/30/2016. FINDINGS: Chronic bronchiectasis with peribronchial thickening and large lung volumes. No superimposed pneumothorax or pleural effusion. No consolidation or acute pulmonary opacity identified. Stable cardiac size and mediastinal contours. Calcified aortic atherosclerosis. Chronic hiatal  hernia. Visualized tracheal air column is within normal limits.  Stable cholecystectomy clips. Negative visible bowel gas pattern. T7 compression fracture again noted. Osteopenia. No acute osseous abnormality identified. IMPRESSION: 1. Chronic lung disease with bronchiectasis and peribronchial opacity appears stable since 01/27/2017. 2.  No superimposed acute findings are identified. Electronically Signed   By: Genevie Ann M.D.   On: 02/20/2017 13:23    Assessment and Plan:   1. Acute on chronic hypoxic respiratory failure, suspect due to long-standing bronchitis/COPD/Wegener's granulomatosis - being managed by IM. No specific evidence of volume overload at this time.  2. Chest pressure/dyspnea - suspect due to intermittent hypoxia and acute pulmonary exacerbation. Cannot exclude demand process superimposed on known coronary disease, but EKG is nonacute and troponins negative thus far. Could also be possibly related to #3.  3. Episodic paroxysmal atrial flutter - he was on Toprol as outpatient but not resumed here - BP running soft at times but suspect it will support some resumption. CHADSVASC score is 3 for age and vascular disease but with his cachexia, anemia, GIB he is at significant risk for bleeding complications and I would be concerned about risk of bleeding. I will review with primary cardiologist.  4. Chronic anemia with subtle downtrend again - prior GIB in 10/2016. Pt admits his stools have been darker the last few days. Will order hemoccult to be sent with next stool. Further management per primary team.   5. CAD - maintained on ASA only given h/o GIB. Will discuss resumption of BB with MD. Continue statin as tolerated.  6. Aortic stenosis - will obtain a 2D Echo to compare trajectory, also to reassess LV function. He would not be a candidate for SAVR and may not be a candidate for TAVR but it would help [name] understand how best to treat him. Would avoid excessive hypotension.  Signed, Charlie Pitter, PA-C  02/22/2017 5:08 PM

## 2017-02-22 NOTE — Progress Notes (Signed)
Responded to consult for prayer. Conferred w/ nurse, who said PT was in rm. Briefly introduced self to pt and family in rm and will stop back by another time.   02/22/17 1500  Clinical Encounter Type  Visited With Patient and family together;Patient not available  Visit Type Initial;Psychological support;Spiritual support;Social support  Referral From Chaplain  Spiritual Encounters  Spiritual Needs Emotional  Stress Factors  Patient Stress Factors Health changes;Loss of control  Family Stress Factors Family relationships;Health changes;Loss of control   Gerrit Heck, Chaplain

## 2017-02-22 NOTE — Evaluation (Signed)
Occupational Therapy Evaluation Patient Details Name: Richard [name] MRN: [id number] DOB: 1936-07-13 Today's Date: 02/22/2017    History of Present Illness  81 y.o. male with medical history significant of Wegener's granulomatosis, PUD and esophagitis, hypertension, hiatal hernia, diverticulosis, COPD, BPH, CAD status post cardiac catheterization and stent placement. Patient presented with multiple generalized and focal complaints all likely related to cardiopulmonary disease   Clinical Impression   Pt was needing a significant amount of assist for ADL of his wife due to poor activity tolerance and back pain. Pt presents with resting HR in 120s and elevated HR to 140s with minimal exertion. Per family, pt has declined substantially since May when he had a compression fx in his spine. Family would like this reassessed while he is hospitalized. Will follow acutely.    Follow Up Recommendations  Home health OT    Equipment Recommendations   (TBD)    Recommendations for Other Services       Precautions / Restrictions Precautions Precautions: Fall Precaution Comments: watch HR and O2 saturations Required Braces or Orthoses: Spinal Brace Spinal Brace: Lumbar corset Restrictions Weight Bearing Restrictions: No      Mobility Bed Mobility Overal bed mobility: Modified Independent                Transfers Overall transfer level: Needs assistance Equipment used: None Transfers: Sit to/from Stand Sit to Stand: Min guard         General transfer comment: min guard for stability and safety    Balance Overall balance assessment: Needs assistance   Sitting balance-Leahy Scale: Good     Standing balance support: During functional activity Standing balance-Leahy Scale: Fair Standing balance comment: min guard for safety with static standing                           ADL either performed or assessed with clinical judgement   ADL Overall ADL's : Needs  assistance/impaired Eating/Feeding: Independent;Bed level   Grooming: Set up;Sitting   Upper Body Bathing: Set up;Sitting   Lower Body Bathing: Min guard;Sit to/from stand   Upper Body Dressing : Minimal assistance;Sitting   Lower Body Dressing: Minimal assistance;Sit to/from stand   Toilet Transfer: Min guard;Stand-pivot   Toileting- Water quality scientist and Hygiene: Minimal assistance;Sit to/from stand         General ADL Comments: Pt receives assist of his wife at home due to poor endurance.     Vision Baseline Vision/History: Wears glasses Wears Glasses: At all times Patient Visual Report: No change from baseline       Perception     Praxis      Pertinent Vitals/Pain Pain Assessment: Faces Faces Pain Scale: Hurts little more Pain Location: back Pain Descriptors / Indicators: Aching Pain Intervention(s): Monitored during session     Hand Dominance Right   Extremity/Trunk Assessment Upper Extremity Assessment Upper Extremity Assessment: Generalized weakness   Lower Extremity Assessment Lower Extremity Assessment: Generalized weakness   Cervical / Trunk Assessment Cervical / Trunk Assessment: Kyphotic (hx of compression fx)   Communication Communication Communication: HOH   Cognition Arousal/Alertness: Awake/alert Behavior During Therapy: Flat affect Overall Cognitive Status: Within Functional Limits for tasks assessed                                     General Comments       Exercises  Shoulder Instructions      Home Living Family/patient expects to be discharged to:: Private residence Living Arrangements: Spouse/significant other Available Help at Discharge: Family;Available 24 hours/day Type of Home: House Home Access: Stairs to enter [employer] of Steps: 2 Entrance Stairs-Rails: None Home Layout: One level     Bathroom Shower/Tub: Occupational psychologist: Standard     Home Equipment:  Environmental consultant - 2 wheels;Cane - single point;Tub bench          Prior Functioning/Environment Level of Independence: Needs assistance  Gait / Transfers Assistance Needed: assist going to the bathroom in the house due to decreased energy ADL's / Homemaking Assistance Needed: needs help with bathing, dressing and all IADL            OT Problem List: Decreased strength;Decreased activity tolerance;Impaired balance (sitting and/or standing);Decreased safety awareness;Decreased knowledge of use of DME or AE;Cardiopulmonary status limiting activity;Pain      OT Treatment/Interventions: Self-care/ADL training;Patient/family education;Balance training;Therapeutic activities;Energy conservation    OT Goals(Current goals can be found in the care plan section) Acute Rehab OT Goals Patient Stated Goal: to feel better OT Goal Formulation: With patient Time For Goal Achievement: 03/08/17 Potential to Achieve Goals: Good ADL Goals Pt Will Perform Grooming: with supervision;standing (2 activities) Pt Will Perform Lower Body Bathing: with supervision;sit to/from stand Pt Will Perform Lower Body Dressing: with supervision;sit to/from stand Pt Will Transfer to Toilet: with supervision;ambulating;regular height toilet Pt Will Perform Toileting - Clothing Manipulation and hygiene: with supervision;sit to/from stand Additional ADL Goal #1: Pt will employ energy conservation strategies in ADL and mobility with minimal verbal cues.  OT Frequency: Min 2X/week   Barriers to D/C:            Co-evaluation PT/OT/SLP Co-Evaluation/Treatment: Yes Reason for Co-Treatment: For patient/therapist safety (decreased activity tolerance)   OT goals addressed during session: ADL's and self-care      AM-PAC PT "6 Clicks" Daily Activity     Outcome Measure Help from another person eating meals?: None Help from another person taking care of personal grooming?: A Little Help from another person toileting, which  includes using toliet, bedpan, or urinal?: A Little Help from another person bathing (including washing, rinsing, drying)?: A Little Help from another person to put on and taking off regular upper body clothing?: A Little Help from another person to put on and taking off regular lower body clothing?: A Little 6 Click Score: 19   End of Session Equipment Utilized During Treatment: Oxygen  Activity Tolerance: Treatment limited secondary to medical complications (Comment) (HR to 148 with activity) Patient left: in bed;with call bell/phone within reach;with family/visitor present  OT Visit Diagnosis: Unsteadiness on feet (R26.81);Muscle weakness (generalized) (M62.81);Pain                Time: 1583-0940 OT Time Calculation (min): 21 min Charges:  OT General Charges $OT Visit: 1 Procedure OT Evaluation $OT Eval Moderate Complexity: 1 Procedure G-Codes:     Malka So 02/22/2017, 4:25 PM  [phone]

## 2017-02-22 NOTE — Progress Notes (Signed)
PROGRESS NOTE    Richard [name]  WUJ:[id number] DOB: [birth year] DOA: 02/05/2017 PCP: [name], MD    Brief Narrative:  81 y.o. male with medical history significant of Wegener's granulomatosis, PUD and esophagitis, hypertension, hiatal hernia, diverticulosis, COPD, BPH, CAD status post cardiac catheterization and stent placement. Patient presented with multiple generalized and focal complaints all likely related to cardiopulmonary disease.   Dyspnea on exertion. Present for approximately the last 6 weeks. Gradual worsening.  Patient is currently being treated for acute on chronic hypoxic respiratory failure presumed to be secondary to COPD exacerbation.   Assessment & Plan:     Acute on chronic respiratory failure [facility]) - Patient has a complicated respiratory/pulmonary history. We'll continue current medication regimen. If no improvement we'll plan on consulting pulmonology next a.m. for further evaluation. - As such continue Levaquin continue prednisone, Dulera and supportive therapy for symptom management with Tessalon and Hycodan.  Active Problems:   Wegener's granulomatosis ([place])   Chronic kidney disease, stage IV (severe) (HCC) - Serum creatinine stable    Protein-calorie malnutrition, severe (HCC) - Agree with continuing ensure    Hypotension - Stable currently and resolved    Chest pressure - None reported today troponin negative 3    FTT (failure to thrive) in adult - Suspect combination of principal problem and protein calorie malnutrition.    Pressure injury of skin   PAF (paroxysmal atrial fibrillation) ([place]) - Patient is currently rate controlled on metoprolol and is currently on aspirin. Suspect patient is not anticoagulant candidate given that his [place] fast score is more than 2 secondary to generalized weakness and increased risk of falls.   DVT prophylaxis:Heparin Code Status: Full Family Communication: none at bedside. Will call family next  am. Disposition Plan: (Pending improvement in respiratory condition with little to no improvement next a.m. we'll consult pulmonology    Consultants:   none   Procedures: none   Antimicrobials: Levaquin   Subjective: Pt has no new complaints.  Objective: Vitals:   02/10/2017 2253 02/22/17 0310 02/22/17 0706 02/22/17 1433  BP: 118/76  124/80 (!) 108/57  Pulse: 95  (!) 101 88  Resp: 18  18 (!) 22  Temp: 98.3 F (36.8 C)  97.6 F (36.4 C) 98.3 F (36.8 C)  TempSrc:      SpO2: 100% 98% 100%   Weight:      Height:        Intake/Output Summary (Last 24 hours) at 02/22/17 1915 Last data filed at 02/22/17 1752  Gross per 24 hour  Intake           1687.5 ml  Output              400 ml  Net           1287.5 ml   Filed Weights   03/01/2017 1021 02/09/2017 1643  Weight: 43.1 kg (95 lb) 40.8 kg (89 lb 14.4 oz)    Examination:  General exam: Appears calm and comfortable, in nad. Respiratory system: No wheezes, prolonged expiratory phase, equal chest rise, poor inspiratory effort  Cardiovascular system: irregularly irregular,  + murmur no rubs or gallops  Gastrointestinal system: Abdomen is nondistended, soft and nontender. No organomegaly or masses felt. Normal bowel sounds heard. Central nervous system: Alert and oriented. No focal neurological deficits. Extremities: Warm and dry  Skin: No rashes, lesions or ulcers, on limited exam  Psychiatry:  Mood & affect appropriate.   Data Reviewed: I have personally reviewed following labs  and imaging studies  CBC:  Recent Labs Lab 02/17/17 1712 02/20/17 1301 02/17/2017 1038 02/22/17 0348  WBC 9.6 6.9 6.1 4.0  NEUTROABS 9.1* 5.9 4.3  --   HGB 10.0* 10.0* 9.9* 8.3*  HCT 30.9* 29.7* 31.2* 26.3*  MCV 110.8* 109.2* 108.7* 110.0*  PLT 199 169.0 Platelet estimate appears normal. 153 284*   Basic Metabolic Panel:  Recent Labs Lab 02/17/17 1712 02/20/17 1301 03/02/2017 1038 02/22/17 0348  NA 142 140 141 143  K 5.5* 4.5 4.9  4.5  CL 107 104 106 111  CO2 25 28 24 24   GLUCOSE 200* 108* 107* 117*  BUN 67* 59* 61* 57*  CREATININE 2.00* 1.85* 2.07* 1.76*  CALCIUM 8.8* 8.7 8.6* 8.0*   GFR: Estimated Creatinine Clearance: 20 mL/min (A) (by C-G formula based on SCr of 1.76 mg/dL (H)). Liver Function Tests:  Recent Labs Lab 02/13/2017 1038  AST 15  ALT 12*  ALKPHOS 126  BILITOT 1.1  PROT 4.9*  ALBUMIN 2.4*    Recent Labs Lab 02/10/2017 1038  LIPASE 18   No results for input(s): AMMONIA in the last 168 hours. Coagulation Profile: No results for input(s): INR, PROTIME in the last 168 hours. Cardiac Enzymes:  Recent Labs Lab 02/14/2017 1038 02/08/2017 1403 02/07/2017 1646 02/09/2017 1930  TROPONINI <0.03 <0.03 <0.03 <0.03   BNP (last 3 results) No results for input(s): PROBNP in the last 8760 hours. HbA1C: No results for input(s): HGBA1C in the last 72 hours. CBG: No results for input(s): GLUCAP in the last 168 hours. Lipid Profile: No results for input(s): CHOL, HDL, LDLCALC, TRIG, CHOLHDL, LDLDIRECT in the last 72 hours. Thyroid Function Tests: No results for input(s): TSH, T4TOTAL, FREET4, T3FREE, THYROIDAB in the last 72 hours. Anemia Panel: No results for input(s): VITAMINB12, FOLATE, FERRITIN, TIBC, IRON, RETICCTPCT in the last 72 hours. Sepsis Labs: No results for input(s): PROCALCITON, LATICACIDVEN in the last 168 hours.  No results found for this or any previous visit (from the past 240 hour(s)).       Radiology Studies: Dg Chest 2 View  Result Date: 03/02/2017 CLINICAL DATA:  Chest pain and shortness of breath for the past 3 weeks. EXAM: CHEST  2 VIEW COMPARISON:  02/20/2017. FINDINGS: Normal sized heart. The lungs remain hyperexpanded. Patchy opacity is again demonstrated throughout the majority of the right lung. The left lung is clear. The lungs remain hyperexpanded. Stable prominence of the interstitial markings in both lungs. Diffuse osteopenia. Stable marked T7 vertebral  compression deformity. IMPRESSION: Stable changes of COPD with interstitial fibrosis. No acute abnormality. Electronically Signed   By: Claudie Revering M.D.   On: 02/23/2017 11:26     Scheduled Meds: . aspirin EC  81 mg Oral Daily  . calcitRIOL  0.25 mcg Oral Daily  . cyclophosphamide  25 mg Oral Daily  . famotidine  20 mg Oral Daily  . feeding supplement (ENSURE ENLIVE)  237 mL Oral BID BM  . finasteride  5 mg Oral Daily  . heparin  5,000 Units Subcutaneous Q8H  . ipratropium-albuterol  3 mL Nebulization Q6H  . metoprolol succinate  12.5 mg Oral Daily  . mometasone-formoterol  2 puff Inhalation BID  . [START ON 02/23/2017] PARoxetine  20 mg Oral Daily  . predniSONE  50 mg Oral Q breakfast  . psyllium  1 packet Oral Daily  . simvastatin  20 mg Oral q1800  . sodium bicarbonate  1,300 mg Oral BID  . tamsulosin  0.4 mg Oral Daily  Continuous Infusions: . sodium chloride 75 mL/hr at 02/22/17 0600  . [START ON 02/23/2017] levofloxacin (LEVAQUIN) IV    . levofloxacin (LEVAQUIN) IV       LOS: 1 day    Time spent: > 35 minutes  Velvet Bathe, MD [facility] Pager [phone]  If 7PM-7AM, please contact night-coverage [email or web address] Password [facility] 02/22/2017, 7:15 PM

## 2017-02-22 NOTE — Evaluation (Signed)
Physical Therapy Evaluation Patient Details Name: Richard [name] MRN: [id number] DOB: Nov 03, 1935 Today's Date: 02/22/2017   History of Present Illness   81 y.o. male with medical history significant of Wegener's granulomatosis, PUD and esophagitis, hypertension, hiatal hernia, diverticulosis, COPD, BPH, CAD status post cardiac catheterization and stent placement. Patient presented with multiple generalized and focal complaints all likely related to cardiopulmonary disease  Clinical Impression  Orders received for PT evaluation. Patient demonstrates deficits in functional mobility as indicated below. Will benefit from continued skilled PT to address deficits and maximize function. Will see as indicated and progress as tolerated.  OF NOTE: session limited due to unstable HR during session. Prior to activity 120s at rest, with very minimal mobility (EOB standing) HR elevated to 148 with increased DOE on 3 liters supplemental O2.   Patient family present during session, expressed significant concerns over management of patients compression fx and stated that they would like these issues address as they feel decline in function has stemmed from this.    Follow Up Recommendations Home health PT;Supervision for mobility/OOB    Equipment Recommendations  None recommended by PT    Recommendations for Other Services       Precautions / Restrictions Precautions Precautions: Fall Precaution Comments: watch HR and O2 saturations Required Braces or Orthoses: Spinal Brace Spinal Brace: Thoracolumbosacral orthotic (not present during session)      Mobility  Bed Mobility Overal bed mobility: Modified Independent                Transfers Overall transfer level: Needs assistance Equipment used: None Transfers: Sit to/from Stand Sit to Stand: Min guard         General transfer comment: min guard for stability and safety  Ambulation/Gait             General Gait Details:  unable to ambulate due to DOE on 3 liters and HR elevated 148   Stairs            Wheelchair Mobility    Modified Rankin (Stroke Patients Only)       Balance Overall balance assessment: Needs assistance   Sitting balance-Leahy Scale: Good     Standing balance support: During functional activity Standing balance-Leahy Scale: Fair Standing balance comment: min guard for safety                             Pertinent Vitals/Pain Pain Assessment: Faces Faces Pain Scale: Hurts little more Pain Location: back Pain Descriptors / Indicators: Aching Pain Intervention(s): Monitored during session    Home Living Family/patient expects to be discharged to:: Private residence Living Arrangements: Spouse/significant other Available Help at Discharge: Family;Available 24 hours/day Type of Home: House Home Access: Stairs to enter Entrance Stairs-Rails: None Entrance Stairs-Number of Steps: 2 Home Layout: One level Home Equipment: Walker - 2 wheels;Cane - single point;Tub bench      Prior Function Level of Independence: Needs assistance   Gait / Transfers Assistance Needed: assist going to the bathroom in the house due to decreased energy  ADL's / Homemaking Assistance Needed: needs help with bathing dressing        Hand Dominance   Dominant Hand: Right    Extremity/Trunk Assessment   Upper Extremity Assessment Upper Extremity Assessment: Generalized weakness    Lower Extremity Assessment Lower Extremity Assessment: Generalized weakness    Cervical / Trunk Assessment Cervical / Trunk Assessment: Kyphotic (history of compression fx)  Communication  Communication: HOH  Cognition Arousal/Alertness: Awake/alert Behavior During Therapy: Flat affect Overall Cognitive Status: Within Functional Limits for tasks assessed                                        General Comments      Exercises     Assessment/Plan    PT Assessment  Patient needs continued PT services  PT Problem List Decreased strength;Decreased balance;Decreased activity tolerance;Decreased mobility;Cardiopulmonary status limiting activity;Pain       PT Treatment Interventions DME instruction;Gait training;Stair training;Functional mobility training;Therapeutic activities;Therapeutic exercise;Balance training;Patient/family education    PT Goals (Current goals can be found in the Care Plan section)  Acute Rehab PT Goals Patient Stated Goal: to feel better PT Goal Formulation: With patient Time For Goal Achievement: 03/08/17 Potential to Achieve Goals: Good    Frequency Min 3X/week   Barriers to discharge        Co-evaluation               AM-PAC PT "6 Clicks" Daily Activity  Outcome Measure Difficulty turning over in bed (including adjusting bedclothes, sheets and blankets)?: None Difficulty moving from lying on back to sitting on the side of the bed? : None Difficulty sitting down on and standing up from a chair with arms (e.g., wheelchair, bedside commode, etc,.)?: None Help needed moving to and from a bed to chair (including a wheelchair)?: A Little Help needed walking in hospital room?: A Little Help needed climbing 3-5 steps with a railing? : A Lot 6 Click Score: 20    End of Session Equipment Utilized During Treatment: Oxygen (3 liters) Activity Tolerance: Patient limited by fatigue;Treatment limited secondary to medical complications (Comment) (elevated HR 148) Patient left: in bed;with call bell/phone within reach;with family/visitor present Nurse Communication: Mobility status PT Visit Diagnosis: Unsteadiness on feet (R26.81);Muscle weakness (generalized) (M62.81);Adult, failure to thrive (R62.7);Pain Pain - part of body:  (back)    Time: 5183-3582 PT Time Calculation (min) (ACUTE ONLY): 22 min   Charges:   PT Evaluation $PT Eval Moderate Complexity: 1 Procedure     PT G Codes:        Alben Deeds, PT DPT  NCS [phone]   Duncan Dull 02/22/2017, 3:25 PM

## 2017-02-22 NOTE — Progress Notes (Signed)
Telemetry called, patient maintaining HR of 140's and converting to a. Fib. MD notified. "5w35. Telemetry called, patient sustaining HR of 140'S, loss of pwaves and PAC, also state converting to a. fib. Please advise."  MD ordering 5 mg cardizem will continue to monitor.

## 2017-02-22 NOTE — Care Management Note (Addendum)
Case Management Note  Patient Details  Name: Richard [name] MRN: [id number] Date of Birth: 1936/02/08  Subjective/Objective:    Pt admitted with                 Action/Plan:  PTA from home with wife - wife/family prepare meals.  Pt has not been eating at home and mobility has also recent declined.  Pt has walker and cane in the home.  PT evaluated pt and are recommending HHPT - choice given to wife - [facility] chosen  [facility] will be arranged once order is received.  CM requested pulm ambulatory note via charge nurse and directly with attending.   Pt has PCP and is able to afford medications as prescribed   Expected Discharge Date:                  Expected Discharge Plan:     In-House Referral:     Discharge planning Services  CM Consult  Post Acute Care Choice:    Choice offered to:     DME Arranged:    DME Agency:     HH Arranged:    HH Agency:     Status of Service:     If discussed at [employer], dates discussed:    Additional Comments:  Maryclare Labrador, RN 02/22/2017, 3:25 PM

## 2017-02-22 NOTE — Progress Notes (Signed)
Initial Nutrition Assessment  DOCUMENTATION CODES:   Severe malnutrition in context of acute illness/injury  INTERVENTION:  Ensure Enlive po BID, each supplement provides 350 kcal and 20 grams of protein  Mighty Shake II BID with meals, each supplement provides 480-500 kcals and 20-23 grams of protein  NUTRITION DIAGNOSIS:   Malnutrition (Severe) related to acute illness (FTT) as evidenced by 22 percent weight loss in 3 months, severe depletion of muscle mass, severe depletion of body fat.  GOAL:   Patient will meet greater than or equal to 90% of their needs  MONITOR:   PO intake, Supplement acceptance, Weight trends  REASON FOR ASSESSMENT:   Consult Poor PO  ASSESSMENT:   Pt with PMH of PUD/esophagtitis, HTN, hital hernia, diverticulosis, CKD, CAD with CABG, COPD, and Wegener's granulomatosis. Presents this admission with acute on chronic hypoxic respiratory failure and FTT.   Pt currently eating bites at each meal. Reports not having an appetite this admission for unknown reasons. Prior to admission pt states he has had loss of appetite for 3 weeks. The pt's son reports pt hurt his back 6 weeks ago that turned out to be a fracture, and that this was the turning point for his eating habit decline. Pt could not quantify how much he consumes in a day, but suspect pt meets decreased energy intake for malnutrition.   Pt reports a UBW of 115 lbs, the last time being at that wt in March 2018. Wife reports a wt loss of 20 lbs in three weeks, but records do not reflect this change. Wt records indicate pt has lost 23% of body wt since March 2018. This percentage in this four month time frame is significant for severe malnutrition.   Nutrition-Focused physical exam completed. Findings are severe fat depletion in arm and thoracis/lumbar regions, severe muscle depletion in upper and lower extremties, and no edema. Given this pt's 23% wt loss in 4 months, severe muscle depletion, and severe  fat depletion this pt meets criteria for severe malnutrition in the context of acute illness/FTT. Family asking about tube feeding, MD notes to encourage companionship feeding. Will monitor for status change and provide TF recommendations if desired.   Medications reviewed and include: calcitriol, sodium bicarb, NS @ 75 ml/hr Labs reviewed: CBG 107-117  Diet Order:  Diet regular Room service appropriate? Yes; Fluid consistency: Thin  Skin:   (PI stage 1 buttocks)  Last BM:  02/19/17  Height:   Ht Readings from Last 1 Encounters:  02/13/2017 5\' 7"  (1.702 m)    Weight:   Wt Readings from Last 1 Encounters:  02/18/2017 89 lb 14.4 oz (40.8 kg)    Ideal Body Weight:  61.4 kg  BMI:  Body mass index is 14.08 kg/m.  Estimated Nutritional Needs:   Kcal:  1500-1700 (36-41 kcal/kg)  Protein:  85-95 grams (1.4-1.5 g/kg IBW)  Fluid:  >1.5 L/day  EDUCATION NEEDS:   No education needs identified at this time  [place], LDN Pager # - [phone]

## 2017-02-23 ENCOUNTER — Inpatient Hospital Stay (HOSPITAL_COMMUNITY): Payer: Medicare Other

## 2017-02-23 ENCOUNTER — Telehealth: Payer: Self-pay | Admitting: Family Medicine

## 2017-02-23 DIAGNOSIS — I361 Nonrheumatic tricuspid (valve) insufficiency: Secondary | ICD-10-CM

## 2017-02-23 LAB — CBC
HCT: 28.4 % — ABNORMAL LOW (ref 39.0–52.0)
HEMOGLOBIN: 8.8 g/dL — AB (ref 13.0–17.0)
MCH: 34.6 pg — ABNORMAL HIGH (ref 26.0–34.0)
MCHC: 31 g/dL (ref 30.0–36.0)
MCV: 111.8 fL — ABNORMAL HIGH (ref 78.0–100.0)
PLATELETS: 123 10*3/uL — AB (ref 150–400)
RBC: 2.54 MIL/uL — ABNORMAL LOW (ref 4.22–5.81)
RDW: 17.9 % — ABNORMAL HIGH (ref 11.5–15.5)
WBC: 4.3 10*3/uL (ref 4.0–10.5)

## 2017-02-23 LAB — BASIC METABOLIC PANEL
ANION GAP: 8 (ref 5–15)
BUN: 59 mg/dL — ABNORMAL HIGH (ref 6–20)
CALCIUM: 7.8 mg/dL — AB (ref 8.9–10.3)
CO2: 25 mmol/L (ref 22–32)
CREATININE: 1.68 mg/dL — AB (ref 0.61–1.24)
Chloride: 113 mmol/L — ABNORMAL HIGH (ref 101–111)
GFR, EST AFRICAN AMERICAN: 43 mL/min — AB (ref >60)
GFR, EST NON AFRICAN AMERICAN: 37 mL/min — AB (ref >60)
GLUCOSE: 163 mg/dL — AB (ref 65–99)
Potassium: 5.1 mmol/L (ref 3.5–5.1)
Sodium: 146 mmol/L — ABNORMAL HIGH (ref 135–145)

## 2017-02-23 LAB — ECHOCARDIOGRAM COMPLETE
HEIGHTINCHES: 67 in
WEIGHTICAEL: 1438.4 [oz_av]

## 2017-02-23 MED ORDER — BOOST / RESOURCE BREEZE PO LIQD
1.0000 | Freq: Three times a day (TID) | ORAL | Status: DC
  Administered 2017-02-23 – 2017-02-25 (×5): 1 via ORAL

## 2017-02-23 MED ORDER — METHYLPREDNISOLONE SODIUM SUCC 125 MG IJ SOLR
60.0000 mg | Freq: Every day | INTRAMUSCULAR | Status: DC
  Administered 2017-02-23 – 2017-03-07 (×12): 60 mg via INTRAVENOUS
  Filled 2017-02-23 (×13): qty 2

## 2017-02-23 MED ORDER — DRONABINOL 2.5 MG PO CAPS
2.5000 mg | ORAL_CAPSULE | Freq: Two times a day (BID) | ORAL | Status: DC
  Administered 2017-02-24 – 2017-02-27 (×7): 2.5 mg via ORAL
  Filled 2017-02-23 (×7): qty 1

## 2017-02-23 NOTE — Telephone Encounter (Signed)
Pt would to see if someone can call him back on a personal matter.

## 2017-02-23 NOTE — Progress Notes (Signed)
PROGRESS NOTE    Richard [name]  MEB:[id number] DOB: [birth year] DOA: 03/06/2017 PCP: [name], MD    Brief Narrative:  81 y.o. male with medical history significant of Wegener's granulomatosis, PUD and esophagitis, hypertension, hiatal hernia, diverticulosis, COPD, BPH, CAD status post cardiac catheterization and stent placement. Patient presented with multiple generalized and focal complaints all likely related to cardiopulmonary disease.   Dyspnea on exertion. Present for approximately the last 6 weeks. Gradual worsening.  Patient is currently being treated for acute on chronic hypoxic respiratory failure presumed to be secondary to COPD exacerbation.  Assessment & Plan:     Acute on chronic respiratory failure [facility]) - Patient has a complicated respiratory/pulmonary history. Patient has had improvement in respiratory condition as such will defer pulmonary consult. - Improving on Levaquin, Dulera and supportive therapy for symptom management with Tessalon and Hycodan. - Will place on Solu-Medrol and discontinue prednisone  Active Problems:   Wegener's granulomatosis (HCC)   Chronic kidney disease, stage IV (severe) (HCC) - Serum creatinine stable    Protein-calorie malnutrition, severe (HCC) - Agree with continuing ensure    Hypotension - Resolved    Chest pressure - None reported today troponin negative 3    FTT (failure to thrive) in adult - Suspect combination of principal problem and protein calorie malnutrition.    Pressure injury of skin   PAF (paroxysmal atrial fibrillation) ([place]) - Patient is currently rate controlled on metoprolol and is currently on aspirin. Suspect patient is not a anticoagulant candidate given that his [place] score is more than 2 secondary but has generalized weakness and increased risk of falls.   DVT prophylaxis: Heparin Code Status: Full Family Communication: none at bedside. Will call family next am. Disposition Plan:  (Pending improvement in respiratory condition with little to no improvement next a.m. we'll consult pulmonology    Consultants:   none   Procedures: none   Antimicrobials: Levaquin   Subjective: Pt has no new complaints.  Objective: Vitals:   02/23/17 0530 02/23/17 0838 02/23/17 1436 02/23/17 1446  BP: 119/74  101/67   Pulse: 95 96 98 97  Resp: 20 (!) 21 17 20   Temp:   98.6 F (37 C)   TempSrc:      SpO2: 100% 100%  100%  Weight:      Height:        Intake/Output Summary (Last 24 hours) at 02/23/17 1645 Last data filed at 02/23/17 1100  Gross per 24 hour  Intake              335 ml  Output              200 ml  Net              135 ml   Filed Weights   02/05/2017 1021 02/25/2017 1643  Weight: 43.1 kg (95 lb) 40.8 kg (89 lb 14.4 oz)    Examination:Physical exam unchanged compared to 02/22/2017  General exam: Appears calm and comfortable, in nad. Respiratory system: No wheezes, prolonged expiratory phase, equal chest rise, poor inspiratory effort  Cardiovascular system: irregularly irregular,  + murmur no rubs or gallops  Gastrointestinal system: Abdomen is nondistended, soft and nontender. No organomegaly or masses felt. Normal bowel sounds heard. Central nervous system: Alert and oriented. No focal neurological deficits. Extremities: Warm and dry  Skin: No rashes, lesions or ulcers, on limited exam  Psychiatry:  Mood & affect appropriate.   Data Reviewed: I have personally reviewed following  labs and imaging studies  CBC:  Recent Labs Lab 02/17/17 1712 02/20/17 1301 02/06/2017 1038 02/22/17 0348 02/23/17 0243  WBC 9.6 6.9 6.1 4.0 4.3  NEUTROABS 9.1* 5.9 4.3  --   --   HGB 10.0* 10.0* 9.9* 8.3* 8.8*  HCT 30.9* 29.7* 31.2* 26.3* 28.4*  MCV 110.8* 109.2* 108.7* 110.0* 111.8*  PLT 199 169.0 Platelet estimate appears normal. 153 116* 371*   Basic Metabolic Panel:  Recent Labs Lab 02/17/17 1712 02/20/17 1301 02/16/2017 1038 02/22/17 0348 02/23/17 0243    NA 142 140 141 143 146*  K 5.5* 4.5 4.9 4.5 5.1  CL 107 104 106 111 113*  CO2 25 28 24 24 25   GLUCOSE 200* 108* 107* 117* 163*  BUN 67* 59* 61* 57* 59*  CREATININE 2.00* 1.85* 2.07* 1.76* 1.68*  CALCIUM 8.8* 8.7 8.6* 8.0* 7.8*   GFR: Estimated Creatinine Clearance: 20.9 mL/min (A) (by C-G formula based on SCr of 1.68 mg/dL (H)). Liver Function Tests:  Recent Labs Lab 02/20/2017 1038  AST 15  ALT 12*  ALKPHOS 126  BILITOT 1.1  PROT 4.9*  ALBUMIN 2.4*    Recent Labs Lab 02/24/2017 1038  LIPASE 18   No results for input(s): AMMONIA in the last 168 hours. Coagulation Profile: No results for input(s): INR, PROTIME in the last 168 hours. Cardiac Enzymes:  Recent Labs Lab 02/06/2017 1038 02/27/2017 1403 02/16/2017 1646 02/10/2017 1930  TROPONINI <0.03 <0.03 <0.03 <0.03   BNP (last 3 results) No results for input(s): PROBNP in the last 8760 hours. HbA1C: No results for input(s): HGBA1C in the last 72 hours. CBG: No results for input(s): GLUCAP in the last 168 hours. Lipid Profile: No results for input(s): CHOL, HDL, LDLCALC, TRIG, CHOLHDL, LDLDIRECT in the last 72 hours. Thyroid Function Tests:  Recent Labs  02/22/17 1937  TSH 0.758  FREET4 0.91   Anemia Panel: No results for input(s): VITAMINB12, FOLATE, FERRITIN, TIBC, IRON, RETICCTPCT in the last 72 hours. Sepsis Labs: No results for input(s): PROCALCITON, LATICACIDVEN in the last 168 hours.  No results found for this or any previous visit (from the past 240 hour(s)).       Radiology Studies: No results found.   Scheduled Meds: . aspirin EC  81 mg Oral Daily  . calcitRIOL  0.25 mcg Oral Daily  . cyclophosphamide  25 mg Oral Daily  . famotidine  20 mg Oral Daily  . feeding supplement  1 Container Oral TID BM  . finasteride  5 mg Oral Daily  . heparin  5,000 Units Subcutaneous Q8H  . ipratropium-albuterol  3 mL Nebulization Q6H  . metoprolol succinate  12.5 mg Oral Daily  . mometasone-formoterol  2  puff Inhalation BID  . PARoxetine  20 mg Oral Daily  . predniSONE  50 mg Oral Q breakfast  . psyllium  1 packet Oral Daily  . simvastatin  20 mg Oral q1800  . sodium bicarbonate  1,300 mg Oral BID  . tamsulosin  0.4 mg Oral Daily   Continuous Infusions: . sodium chloride 75 mL/hr at 02/23/17 0944  . levofloxacin (LEVAQUIN) IV Stopped (02/23/17 1543)  . levofloxacin (LEVAQUIN) IV       LOS: 2 days    Time spent: > 35 minutes  Velvet Bathe, MD [facility] Pager [phone]  If 7PM-7AM, please contact night-coverage [email or web address] Password [facility] 02/23/2017, 4:45 PM

## 2017-02-23 NOTE — Progress Notes (Signed)
*  PRELIMINARY RESULTS* Echocardiogram 2D Echocardiogram has been performed.  Leavy Cella 02/23/2017, 4:15 PM

## 2017-02-23 NOTE — Progress Notes (Signed)
Progress Note  Patient Name: Richard [name] Date of Encounter: 02/23/2017  Primary Cardiologist: Hilty  Subjective   Breathing marginally better today. States he ate a good breakfast. No CP.  Inpatient Medications    Scheduled Meds: . aspirin EC  81 mg Oral Daily  . calcitRIOL  0.25 mcg Oral Daily  . cyclophosphamide  25 mg Oral Daily  . famotidine  20 mg Oral Daily  . feeding supplement (ENSURE ENLIVE)  237 mL Oral BID BM  . finasteride  5 mg Oral Daily  . heparin  5,000 Units Subcutaneous Q8H  . ipratropium-albuterol  3 mL Nebulization Q6H  . metoprolol succinate  12.5 mg Oral Daily  . mometasone-formoterol  2 puff Inhalation BID  . PARoxetine  20 mg Oral Daily  . predniSONE  50 mg Oral Q breakfast  . psyllium  1 packet Oral Daily  . simvastatin  20 mg Oral q1800  . sodium bicarbonate  1,300 mg Oral BID  . tamsulosin  0.4 mg Oral Daily   Continuous Infusions: . sodium chloride 75 mL/hr at 02/23/17 0944  . levofloxacin (LEVAQUIN) IV    . levofloxacin (LEVAQUIN) IV     PRN Meds: acetaminophen **OR** acetaminophen, albuterol, benzonatate, gi cocktail, HYDROcodone-homatropine, nitroGLYCERIN, ondansetron **OR** ondansetron (ZOFRAN) IV, oxyCODONE-acetaminophen **AND** oxyCODONE, simethicone   Vital Signs    Vitals:   02/22/17 2119 02/23/17 0153 02/23/17 0530 02/23/17 0838  BP:   119/74   Pulse:   95 96  Resp:   20 (!) 21  Temp:      TempSrc:      SpO2: 92% 92% 100% 100%  Weight:      Height:        Intake/Output Summary (Last 24 hours) at 02/23/17 1317 Last data filed at 02/23/17 1100  Gross per 24 hour  Intake             1160 ml  Output              200 ml  Net              960 ml   Filed Weights   02/15/2017 1021 02/12/2017 1643  Weight: 95 lb (43.1 kg) 89 lb 14.4 oz (40.8 kg)    Telemetry    NSR/sinus tach, very brief run of regular atrial tach - Personally Reviewed  Physical Exam   GEN: No acute distress. Cachectic HEENT: Normocephalic,  atraumatic, sclera non-icteric. Neck: No JVD or bruits. Cardiac: RRR no rubs or gallops. Moderate higher pitched SEM heard best at RUSB  Radials/DP/PT 1+ and equal bilaterally.  Respiratory: Diffuse coarse BS throughout without wheezing. Breathing is unlabored. GI: Soft, nontender, non-distended, BS +x 4. MS: no deformity. Extremities: No clubbing or cyanosis. No edema. Distal pedal pulses are 2+ and equal bilaterally. Diffuse ecchymosis UE Neuro:  AAOx3. Follows commands. Psych:  Responds to questions appropriately with a normal affect.  Labs    Chemistry Recent Labs Lab 02/16/2017 1038 02/22/17 0348 02/23/17 0243  NA 141 143 146*  K 4.9 4.5 5.1  CL 106 111 113*  CO2 24 24 25   GLUCOSE 107* 117* 163*  BUN 61* 57* 59*  CREATININE 2.07* 1.76* 1.68*  CALCIUM 8.6* 8.0* 7.8*  PROT 4.9*  --   --   ALBUMIN 2.4*  --   --   AST 15  --   --   ALT 12*  --   --   ALKPHOS 126  --   --  BILITOT 1.1  --   --   GFRNONAA 29* 35* 37*  GFRAA 34* 41* 43*  ANIONGAP 11 8 8      Hematology Recent Labs Lab 02/16/2017 1038 02/22/17 0348 02/23/17 0243  WBC 6.1 4.0 4.3  RBC 2.87* 2.39* 2.54*  HGB 9.9* 8.3* 8.8*  HCT 31.2* 26.3* 28.4*  MCV 108.7* 110.0* 111.8*  [name] 34.5* 34.7* 34.6*  MCHC 31.7 31.6 31.0  RDW 17.8* 18.4* 17.9*  PLT 153 116* 123*    Cardiac Enzymes Recent Labs Lab 02/26/2017 1038 02/15/2017 1403 02/18/2017 1646 02/27/2017 1930  TROPONINI <0.03 <0.03 <0.03 <0.03   No results for input(s): TROPIPOC in the last 168 hours.     Radiology    No results found.  Cardiac Studies   Echo pending  Patient Profile     81 y.o. male with multivessel CAD (unsuitable for CABG in 2015 treated with staged multivessel PCI in 2015), ischemic cardiomyopathy (EF 40-45% in 2015, normalized from 2016 on), Wegener's granulomatosis, CKD stage IV, hyperkalemia, BPH, COPD, diverticulosis, anemia, hard of hearing, peptic stricture of esophagus, hypothyroidism, HLD, anemia, GIB 10/2016 requiring  cessation of Plavix, moderate aortic stenosis who was admitted with a/c hypoxic respiratory failure requiring initiation of O2, felt due to underlying lung disease, also noted to have a paroxysm of atrial flutter and atrial tach on telemetry.  Assessment & Plan    1. Acute on chronic hypoxic respiratory failure, suspect due to long-standing bronchitis/COPD/Wegener's granulomatosis - being managed by IM. No specific evidence of volume overload at this time.  2. Chest pressure/dyspnea - suspect due to intermittent hypoxia and acute pulmonary exacerbation. Cannot exclude demand process superimposed on known coronary disease, but EKG is nonacute and troponins negative thus far. Could also be possibly related to #3.  3. Paroxysmal atrial flutter, also brief run of atrial tach - occurred while off beta blocker. Improved since Toprol resumed. Consider titrating to 25mg  daily if BP remains stable. CHADSVASC score is 3 for age and vascular disease but with his cachexia, anemia, GIB he is at significant risk for bleeding complications and is not felt to be an anticoag candidate.  4. Chronic anemia with subtle downtrend again - prior GIB in 10/2016. Pt admits his stools have been darker the last few days. Will order hemoccult to be sent with next stool. Subsequent Hgb is stable. Further management per primary team.   5. CAD - maintained on ASA only given h/o GIB. Continue statin as tolerated.  6. Aortic stenosis - will obtain a 2D Echo to compare trajectory, also to reassess LV function. He would not be a candidate for SAVR and may not be a candidate for TAVR but it would help [name] understand how best to treat him. Would avoid excessive hypotension.  7. Depression - Dr. Debara Pickett recommended initiation of SSRI yesterday; further management of this should come from PCP as OP.  Signed, Charlie Pitter, PA-C  02/23/2017, 1:17 PM

## 2017-02-23 NOTE — Telephone Encounter (Signed)
I left a voice message for pt to return my call.  

## 2017-02-24 DIAGNOSIS — J9622 Acute and chronic respiratory failure with hypercapnia: Secondary | ICD-10-CM

## 2017-02-24 LAB — CBC
HCT: 28.6 % — ABNORMAL LOW (ref 39.0–52.0)
Hemoglobin: 8.7 g/dL — ABNORMAL LOW (ref 13.0–17.0)
MCH: 34.4 pg — ABNORMAL HIGH (ref 26.0–34.0)
MCHC: 30.4 g/dL (ref 30.0–36.0)
MCV: 113 fL — AB (ref 78.0–100.0)
PLATELETS: 114 10*3/uL — AB (ref 150–400)
RBC: 2.53 MIL/uL — ABNORMAL LOW (ref 4.22–5.81)
RDW: 17.8 % — AB (ref 11.5–15.5)
WBC: 5.1 10*3/uL (ref 4.0–10.5)

## 2017-02-24 MED ORDER — TAMSULOSIN HCL 0.4 MG PO CAPS
0.4000 mg | ORAL_CAPSULE | Freq: Every day | ORAL | Status: DC
  Administered 2017-02-25 – 2017-03-05 (×8): 0.4 mg via ORAL
  Filled 2017-02-24 (×10): qty 1

## 2017-02-24 MED ORDER — METOPROLOL SUCCINATE ER 25 MG PO TB24
25.0000 mg | ORAL_TABLET | Freq: Every day | ORAL | Status: DC
  Administered 2017-02-25 – 2017-03-05 (×8): 25 mg via ORAL
  Filled 2017-02-24 (×10): qty 1

## 2017-02-24 NOTE — Progress Notes (Signed)
Physical Therapy Treatment Patient Details Name: Richard [name] MRN: [id number] DOB: 02-15-1936 Today's Date: 02/24/2017    History of Present Illness  81 y.o. male with medical history significant of Wegener's granulomatosis, PUD and esophagitis, hypertension, hiatal hernia, diverticulosis, COPD, BPH, CAD status post cardiac catheterization and stent placement. Patient presented with multiple generalized and focal complaints all likely related to cardiopulmonary disease    PT Comments    Pt performed transfer to chair and unable to advance gait secondary to elevated HR and decreased O2 sats.  3-6 L.  O2 sats 85%-92%.     Follow Up Recommendations  Home health PT;Supervision for mobility/OOB     Equipment Recommendations  None recommended by PT    Recommendations for Other Services       Precautions / Restrictions Precautions Precautions: Fall Precaution Comments: watch HR and O2 saturations Required Braces or Orthoses: Spinal Brace Spinal Brace: Lumbar corset Restrictions Weight Bearing Restrictions: No    Mobility  Bed Mobility Overal bed mobility: Modified Independent             General bed mobility comments: Asssited with LEs to conserve energy.    Transfers Overall transfer level: Needs assistance Equipment used: None Transfers: Sit to/from Stand Sit to Stand: Min guard         General transfer comment: min guard for stability and safety, Increased O2 to 6L during transition from bed to chair.    Ambulation/Gait                 Stairs            Wheelchair Mobility    Modified Rankin (Stroke Patients Only)       Balance Overall balance assessment: Needs assistance   Sitting balance-Leahy Scale: Good     Standing balance support: During functional activity Standing balance-Leahy Scale: Fair Standing balance comment: min guard for safety with static standing                            Cognition  Arousal/Alertness: Awake/alert Behavior During Therapy: WFL for tasks assessed/performed Overall Cognitive Status: Within Functional Limits for tasks assessed                                        Exercises      General Comments        Pertinent Vitals/Pain Pain Assessment: No/denies pain Faces Pain Scale: Hurts little more Pain Location: back Pain Descriptors / Indicators: Aching Pain Intervention(s): Monitored during session;Repositioned    Home Living                      Prior Function            PT Goals (current goals can now be found in the care plan section) Acute Rehab PT Goals Patient Stated Goal: to feel better Potential to Achieve Goals: Good Progress towards PT goals: Progressing toward goals    Frequency    Min 3X/week      PT Plan Current plan remains appropriate    Co-evaluation              AM-PAC PT "6 Clicks" Daily Activity  Outcome Measure  Difficulty turning over in bed (including adjusting bedclothes, sheets and blankets)?: A Lot Difficulty moving from lying on back to sitting on the side  of the bed? : A Lot Difficulty sitting down on and standing up from a chair with arms (e.g., wheelchair, bedside commode, etc,.)?: A Lot Help needed moving to and from a bed to chair (including a wheelchair)?: A Little Help needed walking in hospital room?: A Little Help needed climbing 3-5 steps with a railing? : A Little 6 Click Score: 15    End of Session Equipment Utilized During Treatment: Oxygen (4-6L) Activity Tolerance: Patient limited by fatigue;Treatment limited secondary to medical complications (Comment) (HR elevated to 130s.  ) Patient left: in bed;with call bell/phone within reach;with family/visitor present Nurse Communication: Mobility status PT Visit Diagnosis: Unsteadiness on feet (R26.81);Muscle weakness (generalized) (M62.81);Adult, failure to thrive (R62.7);Pain Pain - part of body:  (back)      Time: 7902-4097 PT Time Calculation (min) (ACUTE ONLY): 12 min  Charges:  $Therapeutic Activity: 8-22 mins                    G Codes:       Governor Rooks, PTA pager [phone]    Cristela Blue 02/24/2017, 4:04 PM

## 2017-02-24 NOTE — Progress Notes (Signed)
Progress Note  Patient Name: Richard [name] Date of Encounter: 02/24/2017   Primary Cardiologist: Dr. Debara Pickett  Subjective   Sleeping a lot today per family. Not much improvement in dyspnea. No CP.  Inpatient Medications    Scheduled Meds: . aspirin EC  81 mg Oral Daily  . calcitRIOL  0.25 mcg Oral Daily  . cyclophosphamide  25 mg Oral Daily  . dronabinol  2.5 mg Oral BID AC  . famotidine  20 mg Oral Daily  . feeding supplement  1 Container Oral TID BM  . finasteride  5 mg Oral Daily  . heparin  5,000 Units Subcutaneous Q8H  . ipratropium-albuterol  3 mL Nebulization Q6H  . methylPREDNISolone (SOLU-MEDROL) injection  60 mg Intravenous Daily  . metoprolol succinate  12.5 mg Oral Daily  . mometasone-formoterol  2 puff Inhalation BID  . PARoxetine  20 mg Oral Daily  . psyllium  1 packet Oral Daily  . simvastatin  20 mg Oral q1800  . sodium bicarbonate  1,300 mg Oral BID  . [START ON 02/25/2017] tamsulosin  0.4 mg Oral Daily   Continuous Infusions: . sodium chloride 75 mL/hr at 02/23/17 2309  . levofloxacin (LEVAQUIN) IV Stopped (02/23/17 1543)  . levofloxacin (LEVAQUIN) IV     PRN Meds: acetaminophen **OR** acetaminophen, albuterol, benzonatate, gi cocktail, HYDROcodone-homatropine, nitroGLYCERIN, ondansetron **OR** ondansetron (ZOFRAN) IV, oxyCODONE-acetaminophen **AND** oxyCODONE, simethicone   Vital Signs    Vitals:   02/23/17 2055 02/24/17 0059 02/24/17 0537 02/24/17 0912  BP:   118/74   Pulse:   (!) 103 (!) 101  Resp:   20 20  Temp:   98.1 F (36.7 C)   TempSrc:   Oral   SpO2: 98% 96% 100% 100%  Weight:      Height:        Intake/Output Summary (Last 24 hours) at 02/24/17 1144 Last data filed at 02/24/17 1000  Gross per 24 hour  Intake             1830 ml  Output              500 ml  Net             1330 ml   Filed Weights   03/02/2017 1021 02/22/2017 1643  Weight: 95 lb (43.1 kg) 89 lb 14.4 oz (40.8 kg)    Telemetry    NSR/ST rare PVC - Personally  Reviewed  Physical Exam   GEN: No acute distress. Cachectic HEENT: Normocephalic, atraumatic, sclera non-icteric. Neck: No JVD or bruits. Cardiac: RRR no rubs or gallops. Moderate higher pitched SEM heard best at RUSB Radials/DP/PT 1+ and equal bilaterally.  Respiratory: Diffuse coarseness to BS throughout, no wheezing. Breathing is unlabored. GI: Soft, nontender, non-distended, BS +x 4. MS: no deformity. Extremities: No clubbing or cyanosis. No edema. Distal pedal pulses are 2+ and equal bilaterally. Diffuse ecchymosis UE Neuro:  AAOx3. Follows commands. Psych:  Responds to questions appropriately with a normal affect.  Labs    Chemistry Recent Labs Lab 02/20/2017 1038 02/22/17 0348 02/23/17 0243  NA 141 143 146*  K 4.9 4.5 5.1  CL 106 111 113*  CO2 24 24 25   GLUCOSE 107* 117* 163*  BUN 61* 57* 59*  CREATININE 2.07* 1.76* 1.68*  CALCIUM 8.6* 8.0* 7.8*  PROT 4.9*  --   --   ALBUMIN 2.4*  --   --   AST 15  --   --   ALT 12*  --   --  ALKPHOS 126  --   --   BILITOT 1.1  --   --   GFRNONAA 29* 35* 37*  GFRAA 34* 41* 43*  ANIONGAP 11 8 8      Hematology Recent Labs Lab 02/22/17 0348 02/23/17 0243 02/24/17 0430  WBC 4.0 4.3 5.1  RBC 2.39* 2.54* 2.53*  HGB 8.3* 8.8* 8.7*  HCT 26.3* 28.4* 28.6*  MCV 110.0* 111.8* 113.0*  [name] 34.7* 34.6* 34.4*  MCHC 31.6 31.0 30.4  RDW 18.4* 17.9* 17.8*  PLT 116* 123* 114*    Cardiac Enzymes Recent Labs Lab 03/05/2017 1038 02/26/2017 1403 02/22/2017 1646 03/05/2017 1930  TROPONINI <0.03 <0.03 <0.03 <0.03   No results for input(s): TROPIPOC in the last 168 hours.     Radiology    No results found.  Cardiac Studies   2D echo 02/23/17 ------------------------------------------------------------------- Study Conclusions  - Left ventricle: The cavity size was normal. There was mild   concentric hypertrophy. Systolic function was vigorous. The   estimated ejection fraction was in the range of 65% to 70%. Wall   motion was  normal; there were no regional wall motion   abnormalities. Doppler parameters are consistent with abnormal   left ventricular relaxation (grade 1 diastolic dysfunction). - Aortic valve: Valve mobility was restricted. There was moderate   stenosis. There was no regurgitation. Peak velocity (S): 363   cm/s. Mean gradient (S): 33 mm Hg. Valve area (VTI): 0.97 cm^2.   Valve area (Vmax): 0.83 cm^2. Valve area (Vmean): 0.78 cm^2. - Mitral valve: There was no regurgitation. Mean gradient (D): 4 mm   Hg. Valve area by continuity equation (using LVOT flow): 2.18   cm^2. - Atrial septum: No defect or patent foramen ovale was identified   by color flow Doppler. - Tricuspid valve: There was mild regurgitation. - Pulmonary arteries: Systolic pressure was moderately increased.   PA peak pressure: 51 mm Hg (S).  Patient Profile     81 y.o. male with multivessel CAD (unsuitable for CABG in 2015 treated with staged multivessel PCI in 2015), ischemic cardiomyopathy (EF 40-45% in 2015, normalized from 2016 on), Wegener's granulomatosis, CKD stage IV, hyperkalemia, BPH, COPD, diverticulosis, anemia, hard of hearing, peptic stricture of esophagus, hypothyroidism, HLD, anemia, GIB 10/2016 requiring cessation of Plavix, moderate aortic stenosis who was admitted with a/c hypoxic respiratory failure requiring initiation of O2, felt due to underlying lung disease, also noted to have a paroxysm of atrial flutter and atrial tach on telemetry.  Assessment & Plan    1. Acute on chronic hypoxic respiratory failure, suspect due to long-standing bronchitis/COPD/Wegener's granulomatosis - being managed by IM who plans to add IV steroids. May need pulm consultation to discuss expectations of recovery. No specific evidence of volume overload at this time.  2. Chest pressure/dyspnea- suspect due to intermittent hypoxia and acute pulmonary exacerbation. Cannot exclude demand process superimposed on known coronary disease, but  EKG is nonacute and troponins negative thus far. Could also be possibly related to #3. No plan for ischemic eval at this time. LVEF preserved.  3. Paroxysmal atrial flutter, also brief run of atrial tach - occurred while off beta blocker. Resolved since Toprol resumed. Consider titrating to 25mg  daily if BP remains stable. CHADSVASC score is 3 for age and vascular disease but with his cachexia, anemia, GIB he is at significant risk for bleeding complications and is not felt to be an anticoag candidate.  4. Chronic anemia with subtle downtrend again- prior GIB in 10/2016. Generally stable since admission.  5. CAD- maintained on ASA only given h/o GIB. Continue statin as tolerated.  6. Aortic stenosis - remains moderate with mean gradient 33. No specific intervention needed. In the future he would not be a candidate for SAVR given comorbidities, doubt TAVR either. As of right now, neither warranted. Would avoid excessive hypotension.  7. Depression - Dr. Debara Pickett recommended initiation of SSRI this admission; further management of this should come from PCP as OP.  Signed, Charlie Pitter, PA-C  02/24/2017, 11:44 AM

## 2017-02-24 NOTE — Telephone Encounter (Signed)
Pts wife is returning the call and state he is in [facility] hospital on [address] if you would like to speak to someone you can call the wife.

## 2017-02-24 NOTE — Care Management Important Message (Signed)
Important Message  Patient Details  Name: Richard [name] MRN: [id number] Date of Birth: 07/07/1936   Medicare Important Message Given:  Yes    Nathen May 02/24/2017, 10:43 AM

## 2017-02-24 NOTE — Telephone Encounter (Signed)
I spoke with son Tommie Raymond today and he will make sure pt get's on schedule for hospital follow up with Dr. Sarajane Jews.

## 2017-02-25 ENCOUNTER — Encounter: Payer: Self-pay | Admitting: Internal Medicine

## 2017-02-25 DIAGNOSIS — J479 Bronchiectasis, uncomplicated: Secondary | ICD-10-CM

## 2017-02-25 DIAGNOSIS — M3130 Wegener's granulomatosis without renal involvement: Secondary | ICD-10-CM

## 2017-02-25 DIAGNOSIS — J9621 Acute and chronic respiratory failure with hypoxia: Secondary | ICD-10-CM

## 2017-02-25 MED ORDER — SODIUM CHLORIDE 3 % IN NEBU
4.0000 mL | INHALATION_SOLUTION | Freq: Once | RESPIRATORY_TRACT | Status: AC | PRN
  Administered 2017-02-25: 4 mL via RESPIRATORY_TRACT
  Filled 2017-02-25: qty 4

## 2017-02-25 MED ORDER — ARFORMOTEROL TARTRATE 15 MCG/2ML IN NEBU
15.0000 ug | INHALATION_SOLUTION | Freq: Two times a day (BID) | RESPIRATORY_TRACT | Status: DC
  Administered 2017-02-25 – 2017-03-07 (×21): 15 ug via RESPIRATORY_TRACT
  Filled 2017-02-25 (×22): qty 2

## 2017-02-25 MED ORDER — BUDESONIDE 0.25 MG/2ML IN SUSP
0.2500 mg | Freq: Two times a day (BID) | RESPIRATORY_TRACT | Status: DC
  Administered 2017-02-25 – 2017-03-07 (×21): 0.25 mg via RESPIRATORY_TRACT
  Filled 2017-02-25 (×22): qty 2

## 2017-02-25 MED ORDER — FAMOTIDINE 20 MG PO TABS
20.0000 mg | ORAL_TABLET | Freq: Every day | ORAL | Status: DC
  Administered 2017-02-25 – 2017-03-05 (×8): 20 mg via ORAL
  Filled 2017-02-25 (×9): qty 1

## 2017-02-25 MED ORDER — PANTOPRAZOLE SODIUM 40 MG PO TBEC
40.0000 mg | DELAYED_RELEASE_TABLET | Freq: Every day | ORAL | Status: DC
  Administered 2017-02-25 – 2017-03-05 (×8): 40 mg via ORAL
  Filled 2017-02-25 (×10): qty 1

## 2017-02-25 NOTE — Consult Note (Signed)
PULMONARY / CRITICAL CARE MEDICINE   Name: Richard [name] MRN: [id number] DOB: 08/07/1936    ADMISSION DATE:  03/05/2017 CONSULTATION DATE:  02/25/17  REFERRING MD:  Wendee Beavers   CHIEF COMPLAINT:  Cough/sob/ dysphagia   HISTORY OF PRESENT ILLNESS:     59  yowm never smoker with dx of Longstanding bronchiectasis then dx with WG 03/2010 seen in office 02/20/17 on an extremely complex medical regimen but not using flutter valve and unalble to tol vest with FTT/ worsening doe x 6 weeks and persistent purulent sputum so rec empirical levaquin/ flutter valve and increase pred to 20 mg per day until improvement but came to ER 7/17  "no better" with new transient cp and Aflutter and PCCM consulted am 7/21 unable to cough up secretions well but when does are quite yellow, not bloody, with new report of dysphagia x 2 weeks not brought up as a concern in office on 02/13/15 assoc with profound anorexia and concern for dehydration but no evidence for this on lab 7/16.   No obvious day to day or daytime variability or assoc   mucus plugs or hemoptysis or cp or chest tightness, subjective wheeze or overt sinus or hb symptoms. No unusual exp hx or h/o childhood pna/ asthma or knowledge of premature birth.   . Also denies any obvious fluctuation of symptoms with weather or environmental changes or other aggravating or alleviating factors except as outlined above   Current Medications, Allergies, Complete Past Medical History, Past Surgical History, Family History, and Social History were reviewed in [employer] record.  ROS  The following are not active complaints unless bolded sore throat, dysphagia, dental problems, itching, sneezing,  nasal congestion or excess/ purulent secretions, ear ache,   fever, chills, sweats, unintended wt loss, classically pleuritic or exertional cp,  orthopnea pnd or leg swelling, presyncope, palpitations, abdominal pain, anorexia, nausea, vomiting, diarrhea  or  change in bowel or bladder habits, change in stools or urine, dysuria,hematuria,  rash, arthralgias, visual complaints, headache, numbness, weakness or ataxia or problems with walking or coordination,  change in mood/affect or memory.           PAST MEDICAL HISTORY :  He  has a past medical history of Adenomatous colon polyp; Anemia; Asthma; BPH (benign prostatic hyperplasia); Bronchiectasis ([place]); CAD (coronary artery disease); Chronic bronchitis (HCC); CKD (chronic kidney disease), stage IV (HCC); COPD (chronic obstructive pulmonary disease) ([place]); Diverticulosis; Femoral artery pseudo-aneurysm, right ([place]); Fungal infection; GERD (gastroesophageal reflux disease); GI bleed; Hiatal hernia; History of blood transfusion (07/2016; 11/01/2016); History of kidney stones; HOH (hard of hearing); HTN (hypertension) (09/28/2013); Ischemic cardiomyopathy; Mild mitral regurgitation; Moderate aortic stenosis by prior echocardiogram; Peptic stricture of esophagus; Pneumonia; and Wegener's granulomatosis ([place]).  PAST SURGICAL HISTORY: He  has a past surgical history that includes Colonoscopy (08-16-11); Esophagogastroduodenoscopy (egd) with esophageal dilation (11-29-10); Cholecystectomy (N/A, 12/21/2012); Hematoma evacuation (Right, 03/19/2014); left heart catheterization with coronary angiogram (N/A, 03/14/2014); percutaneous coronary stent intervention (pci-s) (N/A, 03/17/2014); percutaneous coronary stent intervention (pci-s) (N/A, 03/21/2014); Renal biopsy; Esophagogastroduodenoscopy (N/A, 07/31/2016); Colonoscopy (N/A, 08/01/2016); Inguinal hernia repair (Right, ~ 2002); Cataract extraction w/ intraocular lens  implant, bilateral (12/18/2012); Lung biopsy (03/2014); Cardiac catheterization (03/17/2014); and transthoracic echocardiogram (10/2015; 10/2016).  No Known Allergies  No current facility-administered medications on file prior to encounter.    Current Outpatient Prescriptions on File Prior to Encounter   Medication Sig  . acetaminophen (TYLENOL) 500 MG tablet Take 2 tablets (1,000 mg total) by mouth every 8 (  eight) hours. (Patient taking differently: Take 1,000 mg by mouth every 8 (eight) hours as needed for mild pain or moderate pain. )  . albuterol (VENTOLIN HFA) 108 (90 Base) MCG/ACT inhaler Inhale 1-2 puffs into the lungs every 4 (four) hours as needed for wheezing or shortness of breath.  Marland Kitchen aspirin EC 81 MG tablet Take 81 mg by mouth daily.  Marland Kitchen b complex vitamins tablet Take 1 tablet by mouth daily.    . benzonatate (TESSALON) 200 MG capsule Take 1 capsule (200 mg total) by mouth 3 (three) times daily as needed for cough.  . bisacodyl (DULCOLAX) 5 MG EC tablet Take 5 mg by mouth 2 (two) times daily.   . calcitRIOL (ROCALTROL) 0.25 MCG capsule Take 0.25 mcg by mouth daily.   . cyclophosphamide (CYTOXAN) 25 MG capsule Take 25 mg by mouth daily. Take one capsule by mouth daily on an empty stomach 1 hour before or 2 hours after meal.  . dextromethorphan (DELSYM) 30 MG/5ML liquid Take 30 mg by mouth at bedtime as needed for cough.  . finasteride (PROSCAR) 5 MG tablet Take 1 tablet (5 mg total) by mouth daily.  Marland Kitchen guaiFENesin-codeine 100-10 MG/5ML syrup Take 10 mLs by mouth every 6 (six) hours as needed for cough.  Marland Kitchen HYDROcodone-homatropine (HYDROMET) 5-1.5 MG/5ML syrup Take 5 mLs by mouth every 4 (four) hours as needed. (Patient taking differently: Take 5 mLs by mouth every 4 (four) hours as needed for cough. )  . ipratropium-albuterol (DUONEB) 0.5-2.5 (3) MG/3ML SOLN Take 3 mLs by nebulization every 4 (four) hours as needed (if inhaler is not effective in resolving shortness of breath or wheezing).  Marland Kitchen levofloxacin (LEVAQUIN) 750 MG tablet TAKE ONE TABLET BY MOUTH ONCE DAILY AS  NEEDED  FOR  LUNGS  . loratadine (CLARITIN) 10 MG tablet Take 10 mg by mouth daily.   . Methylcellulose, Laxative, (CITRUCEL PO) Take 1 packet by mouth daily.  . metoprolol succinate (TOPROL-XL) 25 MG 24 hr tablet TAKE ONE  TABLET BY MOUTH ONCE DAILY  . ondansetron (ZOFRAN) 4 MG tablet Take 1 tablet (4 mg total) by mouth every 8 (eight) hours as needed for nausea or vomiting.  Marland Kitchen oxyCODONE-acetaminophen (PERCOCET) 10-325 MG tablet Take 1 tablet by mouth every 6 (six) hours as needed for pain.  . predniSONE (DELTASONE) 20 MG tablet Take 20 mg by mouth daily with breakfast.  . ranitidine (ZANTAC) 300 MG tablet Take 1 tablet (300 mg total) by mouth 2 (two) times daily.  . simethicone (MYLICON) 878 MG chewable tablet Chew 250 mg by mouth 2 (two) times daily as needed for flatulence. TAKE PER BOX FOR GAS   . simvastatin (ZOCOR) 20 MG tablet TAKE ONE TABLET BY MOUTH ONCE DAILY AT  6  PM  . sodium bicarbonate 650 MG tablet Take 2 tablets (1,300 mg total) by mouth 2 (two) times daily. NEED OV.  . sodium chloride (OCEAN) 0.65 % SOLN nasal spray Place 1 spray into both nostrils as needed for congestion.  . SYMBICORT 160-4.5 MCG/ACT inhaler INHALE TWO PUFFS BY MOUTH TWICE DAILY  . tamsulosin (FLOMAX) 0.4 MG CAPS capsule TAKE ONE CAPSULE BY MOUTH ONCE DAILY IN THE EVENING  . nitroGLYCERIN (NITROSTAT) 0.4 MG SL tablet Place 1 tablet (0.4 mg total) under the tongue every 5 (five) minutes x 3 doses as needed for chest pain.    FAMILY HISTORY:  His indicated that his mother is deceased. He indicated that his father is deceased. He indicated that the status of  his neg hx is unknown.    SOCIAL HISTORY: He  reports that he has never smoked. He has never used smokeless tobacco. He reports that he does not drink alcohol or use drugs.    SUBJECTIVE:   appears miserable but unable to articulate a complaint   VITAL SIGNS: BP 133/79 (BP Location: Right Arm)   Pulse (!) 113   Temp 98.1 F (36.7 C) (Oral)   Resp 20   Ht 5\' 7"  (1.702 m)   Wt 89 lb 14.4 oz (40.8 kg)   SpO2 91%   BMI 14.08 kg/m   HEMODYNAMICS:    VENTILATOR SETTINGS:    INTAKE / OUTPUT: I/O last 3 completed shifts: In: 2300 [P.O.:460; I.V.:1840] Out:  895 [Urine:895]  PHYSICAL EXAMINATION: General:  Elderly wm/ appears cachectic and more chronically than acutely ill with congested rattling sounding cough and poor cough mechanics  Wt Readings from Last 3 Encounters:  02/28/2017 89 lb 14.4 oz (40.8 kg)  02/20/17 95 lb 6.4 oz (43.3 kg)  02/06/17 103 lb (46.7 kg)    Vital signs reviewed  - Note on arrival 02 sats  91% on 4lpm     SKIN: no rash, lesions  NODES: no lymphadenopathy  HEENT: [place]/AT, EOM- WNL, Conjuctivae- clear, PERRLA, TM-WNL, Nose- clear, Throat- clear and wnl, Mallampati III  NECK: Supple w/ fair ROM, JVD- none, normal carotid impulses w/o bruits   CHEST: insp and exp rhonchi diffusely bilaterally  HEART: RRR, II-III/VUI Sem   ABDOMEN: Soft and nl EXT:  No deformities or restrictions    LABS:  BMET  Recent Labs Lab 03/03/2017 1038 02/22/17 0348 02/23/17 0243  NA 141 143 146*  K 4.9 4.5 5.1  CL 106 111 113*  CO2 24 24 25   BUN 61* 57* 59*  CREATININE 2.07* 1.76* 1.68*  GLUCOSE 107* 117* 163*    Electrolytes  Recent Labs Lab 03/03/2017 1038 02/22/17 0348 02/23/17 0243  CALCIUM 8.6* 8.0* 7.8*    CBC  Recent Labs Lab 02/23/17 0243 02/24/17 0430 02/25/17 0416  WBC 4.3 5.1 7.0  HGB 8.8* 8.7* 8.4*  HCT 28.4* 28.6* 27.6*  PLT 123* 114* 99*    Coag's No results for input(s): APTT, INR in the last 168 hours.  Sepsis Markers No results for input(s): LATICACIDVEN, PROCALCITON, O2SATVEN in the last 168 hours.  ABG  Recent Labs Lab 02/06/2017 1316  PHART 7.446  PCO2ART 30.5*  PO2ART 85.0    Liver Enzymes  Recent Labs Lab 02/26/2017 1038  AST 15  ALT 12*  ALKPHOS 126  BILITOT 1.1  ALBUMIN 2.4*    Cardiac Enzymes  Recent Labs Lab 02/07/2017 1403 02/09/2017 1646 02/09/2017 1930  TROPONINI <0.03 <0.03 <0.03    Glucose No results for input(s): GLUCAP in the last 168 hours.  Imaging   I personally reviewed images and agree with radiology impression as follows:  CXR:    02/10/2017 Stable changes of COPD with interstitial fibrosis. No acute abnormality.  STUDIES:     CULTURES: Sputum  7/21 >>>    ANTIBIOTICS: Levaquin 7/16 (aeBronchiectasis) >>>          ASSESSMENT / PLAN:   1)  Acute exac obst bronchiectasis/ never smoker   -   PFT's 06/21/11   FEV1  1.60 (61%) ratio 48 and 10% better p B2,  DLCO 83%  - CT 10/04/13 severe multifocal bronchiectasis as well as numerous cavitary and      non-cavitary nodules  -   PFT's 11/22/2012 FEV1  1.52 (62%) arnd 46 and no change p B2 DLCO 89%  -    VEST rec 02/12/2014 > reported improvement 05/23/14 then refused subsequently    - PFT's  02/01/2016  FEV1 1.34 (51 % ) ratio 53  p 4 % improvement from saba p symbicort 160 prior to study with DLCO  44/46 % corrects to 85 % for alv volume    Sputum cx 07/2016 w/++ psuedomonas > pan sensitive , Cipro 750mg  x 10 07/28/2016   In office pt admitted was not using flutter, had not followed any part of his "action plan" and did not disclose the dypsphagia and appeared to have a helpless/hopeless affect/attitude which may be due to chronic illness or be the underlying problem leading to his non-aherence   For now all I have to rec is try the levaquin, check sputum if obtainable, and GI w/u for dysphagia with concern re poorly controlled gerd or frank Aspiration leading to worse resp dysfunction     2)  Respiratory failure/ acute - 02 titrate to sats > 90%     3)  CRI/ WG > not sure Ctx helping any longer at this dose but certainly reluctant to push is harder in this setting  > defer rx to Renal    > 50 % of time spent in counseling with pt and son at bedside  Christinia Gully, MD Pulmonary and [place] [phone] After 5:30 PM or weekends, use [facility] [phone]

## 2017-02-25 NOTE — Assessment & Plan Note (Signed)
Lab Results  Component Value Date   CREATININE 1.68 (H) 02/23/2017   CREATININE 1.76 (H) 02/22/2017   CREATININE 2.07 (H) 02/23/2017   CREATININE 1.92 (H) 03/24/2015   CREATININE 1.58 (H) 05/06/2014   CREATININE 1.61 (H) 04/15/2014    Concerned  May be getting worse lung dz related to [facility] but hard to be sure - will repeat ANCA titers

## 2017-02-25 NOTE — Progress Notes (Signed)
Pharmacy Antibiotic Note Richard [name] is a 81 y.o. male admitted on 02/23/2017 with AoC hypoxic respiratory failure 2/2 COPD exacerbation. Pharmacy consulted for Levaquin dosing. Pt is afebrile, WBC trending up but well WNL. No cultures ordered. Scr elevated 2/2 CKD stage IV.   Plan: Levaquin 750 mg x 1 (7/17), followed by 500 mg IV every 48 hours for 5 days total of therapy. Last day will be today, 7/21.  Height: 5\' 7"  (170.2 cm) Weight: 89 lb 14.4 oz (40.8 kg) IBW/kg (Calculated) : 66.1  Temp (24hrs), Avg:98 F (36.7 C), Min:97.8 F (36.6 C), Max:98.1 F (36.7 C)   Recent Labs Lab 02/20/17 1301 02/06/2017 1038 02/22/17 0348 02/23/17 0243 02/24/17 0430 02/25/17 0416  WBC 6.9 6.1 4.0 4.3 5.1 7.0  CREATININE 1.85* 2.07* 1.76* 1.68*  --   --     Estimated Creatinine Clearance: 20.9 mL/min (A) (by C-G formula based on SCr of 1.68 mg/dL (H)).    No Known Allergies  Richard [name] N. Richard [name], PharmD PGY1 Pharmacy Resident Pager: [phone]  02/25/2017, 10:04 AM

## 2017-02-25 NOTE — Progress Notes (Signed)
PROGRESS NOTE  Patient seen on 02/24/17 note entered next day.  Richard [name]  VCB:[id number] DOB: 08-25-1935 DOA: 02/20/2017 PCP: [name], MD    Brief Narrative:  81 y.o. male with medical history significant of Wegener's granulomatosis, PUD and esophagitis, hypertension, hiatal hernia, diverticulosis, COPD, BPH, CAD status post cardiac catheterization and stent placement. Patient presented with multiple generalized and focal complaints all likely related to cardiopulmonary disease.   Dyspnea on exertion. Present for approximately the last 6 weeks. Gradual worsening.  Patient is currently being treated for acute on chronic hypoxic respiratory failure presumed to be secondary to COPD exacerbation.  Assessment & Plan:     Acute on chronic respiratory failure [facility]) - Patient has a complicated respiratory/pulmonary history.  - Consulting Pulmonology given marginal improvement in condition.  - Improving on Levaquin, Dulera and supportive therapy for symptom management with Tessalon and Hycodan. - Patient currently on Solu-Medrol   Dysphagia -Patient seen by [facility] in the past Dr. Fuller Plan - He has required esophageal dilatation. - Consulted GI for further evaluation recommendations.  Active Problems:   Wegener's granulomatosis ([place])   Chronic kidney disease, stage IV (severe) (HCC) - Serum creatinine stable    Protein-calorie malnutrition, severe (HCC) - Agree with continuing ensure - started patient on marrinol for appetite stimulation per patient and family request    Hypotension - Resolved    Chest pressure - None reported today troponin negative 3    FTT (failure to thrive) in adult - Suspect combination of principal problem and protein calorie malnutrition.    Pressure injury of skin   PAF (paroxysmal atrial fibrillation) ([place]) - Patient is currently rate controlled on metoprolol and is currently on aspirin. Suspect patient is not a anticoagulant candidate  despite  Chadvasc2 score is more than 2, due to generalized weakness and increased risk of falls.   DVT prophylaxis: Heparin Code Status: Full Family Communication: discussed with daughter and wife at bedside Disposition Plan: Pending improvement in respiratory condition with little to no improvement.   Consultants:  GI Pulmonology   Procedures: none   Antimicrobials: Levaquin   Subjective: Pt has been complaining of dysphagia with food and liquids. He has had this problem before and was seen by Dr. Fuller Plan and reports having esophageal dilatation in the past. Pt reports that his breathing situation is marginally improved despite treatment regimen.  Objective: Vitals:   02/24/17 1417 02/24/17 2218 02/25/17 0226 02/25/17 0555  BP: 104/62 136/78  133/79  Pulse: (!) 106 65 98 (!) 113  Resp: 17 (!) 21 20 20   Temp: 97.8 F (36.6 C) 98 F (36.7 C)  98.1 F (36.7 C)  TempSrc: Oral Oral  Oral  SpO2: 93% (!) 87% 95% 91%  Weight:      Height:        Intake/Output Summary (Last 24 hours) at 02/25/17 0743 Last data filed at 02/25/17 0518  Gross per 24 hour  Intake             1285 ml  Output              395 ml  Net              890 ml   Filed Weights   02/22/2017 1021 02/20/2017 1643  Weight: 43.1 kg (95 lb) 40.8 kg (89 lb 14.4 oz)    Examination: Physical exam unchanged compared to 02/23/2017  General exam: Appears calm and comfortable, in nad. Respiratory system: No wheezes, prolonged expiratory  phase, equal chest rise, poor inspiratory effort  Cardiovascular system: irregularly irregular,  + murmur no rubs or gallops  Gastrointestinal system: Abdomen is nondistended, soft and nontender. No organomegaly or masses felt. Normal bowel sounds heard. Central nervous system: Alert and oriented. No focal neurological deficits. Extremities: Warm and dry  Skin: No rashes, lesions or ulcers, on limited exam  Psychiatry:  Mood & affect appropriate.   Data Reviewed: I have  personally reviewed following labs and imaging studies  CBC:  Recent Labs Lab 02/20/17 1301 02/27/2017 1038 02/22/17 0348 02/23/17 0243 02/24/17 0430 02/25/17 0416  WBC 6.9 6.1 4.0 4.3 5.1 7.0  NEUTROABS 5.9 4.3  --   --   --   --   HGB 10.0* 9.9* 8.3* 8.8* 8.7* 8.4*  HCT 29.7* 31.2* 26.3* 28.4* 28.6* 27.6*  MCV 109.2* 108.7* 110.0* 111.8* 113.0* 112.7*  PLT 169.0 Platelet estimate appears normal. 153 116* 123* 114* 99*   Basic Metabolic Panel:  Recent Labs Lab 02/20/17 1301 03/02/2017 1038 02/22/17 0348 02/23/17 0243  NA 140 141 143 146*  K 4.5 4.9 4.5 5.1  CL 104 106 111 113*  CO2 28 24 24 25   GLUCOSE 108* 107* 117* 163*  BUN 59* 61* 57* 59*  CREATININE 1.85* 2.07* 1.76* 1.68*  CALCIUM 8.7 8.6* 8.0* 7.8*   GFR: Estimated Creatinine Clearance: 20.9 mL/min (A) (by C-G formula based on SCr of 1.68 mg/dL (H)). Liver Function Tests:  Recent Labs Lab 02/28/2017 1038  AST 15  ALT 12*  ALKPHOS 126  BILITOT 1.1  PROT 4.9*  ALBUMIN 2.4*    Recent Labs Lab 03/06/2017 1038  LIPASE 18   No results for input(s): AMMONIA in the last 168 hours. Coagulation Profile: No results for input(s): INR, PROTIME in the last 168 hours. Cardiac Enzymes:  Recent Labs Lab 02/07/2017 1038 02/10/2017 1403 03/05/2017 1646 03/02/2017 1930  TROPONINI <0.03 <0.03 <0.03 <0.03   BNP (last 3 results) No results for input(s): PROBNP in the last 8760 hours. HbA1C: No results for input(s): HGBA1C in the last 72 hours. CBG: No results for input(s): GLUCAP in the last 168 hours. Lipid Profile: No results for input(s): CHOL, HDL, LDLCALC, TRIG, CHOLHDL, LDLDIRECT in the last 72 hours. Thyroid Function Tests:  Recent Labs  02/22/17 1937  TSH 0.758  FREET4 0.91   Anemia Panel: No results for input(s): VITAMINB12, FOLATE, FERRITIN, TIBC, IRON, RETICCTPCT in the last 72 hours. Sepsis Labs: No results for input(s): PROCALCITON, LATICACIDVEN in the last 168 hours.  No results found for this  or any previous visit (from the past 240 hour(s)).       Radiology Studies: No results found.   Scheduled Meds: . aspirin EC  81 mg Oral Daily  . calcitRIOL  0.25 mcg Oral Daily  . cyclophosphamide  25 mg Oral Daily  . dronabinol  2.5 mg Oral BID AC  . famotidine  20 mg Oral Daily  . feeding supplement  1 Container Oral TID BM  . finasteride  5 mg Oral Daily  . heparin  5,000 Units Subcutaneous Q8H  . ipratropium-albuterol  3 mL Nebulization Q6H  . methylPREDNISolone (SOLU-MEDROL) injection  60 mg Intravenous Daily  . metoprolol succinate  25 mg Oral Daily  . mometasone-formoterol  2 puff Inhalation BID  . PARoxetine  20 mg Oral Daily  . psyllium  1 packet Oral Daily  . simvastatin  20 mg Oral q1800  . sodium bicarbonate  1,300 mg Oral BID  . tamsulosin  0.4  mg Oral Daily   Continuous Infusions: . sodium chloride 75 mL/hr at 02/25/17 0419  . levofloxacin (LEVAQUIN) IV Stopped (02/23/17 1543)  . levofloxacin (LEVAQUIN) IV       LOS: 4 days    Time spent: > 35 minutes  Velvet Bathe, MD [facility] Pager [phone]  If 7PM-7AM, please contact night-coverage [email or web address] Password [facility] 02/25/2017, 7:43 AM

## 2017-02-25 NOTE — Progress Notes (Signed)
PROGRESS NOTE  Patient seen on 02/24/17 note entered next day.  Richard [name]  TIW:[id number] DOB: 08-10-1935 DOA: 02/14/2017 PCP: [name], MD    Brief Narrative:  81 y.o. male with medical history significant of Wegener's granulomatosis, PUD and esophagitis, hypertension, hiatal hernia, diverticulosis, COPD, BPH, CAD status post cardiac catheterization and stent placement. Patient presented with multiple generalized and focal complaints all likely related to cardiopulmonary disease.   Dyspnea on exertion. Present for approximately the last 6 weeks. Gradual worsening.  Patient is currently being treated for acute on chronic hypoxic respiratory failure presumed to be secondary to COPD exacerbation.  Assessment & Plan:     Acute on chronic respiratory failure [facility]) - Patient has a complicated respiratory/pulmonary history.  - Consulting Pulmonology given marginal improvement in condition.  - Improving on Levaquin, Dulera and supportive therapy for symptom management with Tessalon and Hycodan. - Patient currently on Solu-Medrol  - Pulmonary team consulted and patient has been non compliant with their recommendations in the past, presumed to be secondary to depression or feeling of hopelessness    Dysphagia -Patient seen by [facility] in the past Dr. Fuller Plan - He has required esophageal dilatation. - Consulted GI awaiting recommendations. Evaluation may be limited due to respiratory status.  Active Problems:   Wegener's granulomatosis ([name])   Chronic kidney disease, stage IV (severe) (HCC) - Serum creatinine stable    Protein-calorie malnutrition, severe (HCC) - Agree with continuing ensure - Most likely due to dysphagia. GI consulted.    Hypotension - Resolved    Chest pressure - None reported today troponin negative 3    FTT (failure to thrive) in adult - Suspect combination of principal problem and protein calorie malnutrition.    Pressure injury of skin   PAF  (paroxysmal atrial fibrillation) ([place]) - Patient is currently rate controlled on metoprolol and is currently on aspirin. Suspect patient is not a anticoagulant candidate despite  Chadvasc2 score is more than 2, due to generalized weakness and increased risk of falls.   DVT prophylaxis: Heparin Code Status: Full Family Communication: discussed with daughter and wife at bedside Disposition Plan: Pending improvement in respiratory condition and work up of dysphagia   Consultants:  GI Pulmonology   Procedures: none   Antimicrobials: Levaquin   Subjective: Pt has no new complaints other than the ones listed above.  Objective: Vitals:   02/25/17 0226 02/25/17 0555 02/25/17 0928 02/25/17 1613  BP:  133/79 (!) 112/59 124/80  Pulse: 98 (!) 113 (!) 126 (!) 119  Resp: 20 20  20   Temp:  98.1 F (36.7 C)  97.6 F (36.4 C)  TempSrc:  Oral  Oral  SpO2: 95% 91%  98%  Weight:      Height:        Intake/Output Summary (Last 24 hours) at 02/25/17 1651 Last data filed at 02/25/17 1623  Gross per 24 hour  Intake             1975 ml  Output             1170 ml  Net              805 ml   Filed Weights   02/11/2017 1021 02/24/2017 1643  Weight: 43.1 kg (95 lb) 40.8 kg (89 lb 14.4 oz)    Examination: Physical exam unchanged compared to 02/24/2017  General exam: Appears calm and comfortable, in nad. Respiratory system: No wheezes, prolonged expiratory phase, equal chest rise, poor inspiratory  effort  Cardiovascular system: irregularly irregular,  + murmur no rubs or gallops  Gastrointestinal system: Abdomen is nondistended, soft and nontender. No organomegaly or masses felt. Normal bowel sounds heard. Central nervous system: Alert and oriented. No focal neurological deficits. Extremities: Warm and dry  Skin: No rashes, lesions or ulcers, on limited exam  Psychiatry:  Mood & affect appropriate.   Data Reviewed: I have personally reviewed following labs and imaging  studies  CBC:  Recent Labs Lab 02/20/17 1301 03/06/2017 1038 02/22/17 0348 02/23/17 0243 02/24/17 0430 02/25/17 0416  WBC 6.9 6.1 4.0 4.3 5.1 7.0  NEUTROABS 5.9 4.3  --   --   --   --   HGB 10.0* 9.9* 8.3* 8.8* 8.7* 8.4*  HCT 29.7* 31.2* 26.3* 28.4* 28.6* 27.6*  MCV 109.2* 108.7* 110.0* 111.8* 113.0* 112.7*  PLT 169.0 Platelet estimate appears normal. 153 116* 123* 114* 99*   Basic Metabolic Panel:  Recent Labs Lab 02/20/17 1301 02/05/2017 1038 02/22/17 0348 02/23/17 0243  NA 140 141 143 146*  K 4.5 4.9 4.5 5.1  CL 104 106 111 113*  CO2 28 24 24 25   GLUCOSE 108* 107* 117* 163*  BUN 59* 61* 57* 59*  CREATININE 1.85* 2.07* 1.76* 1.68*  CALCIUM 8.7 8.6* 8.0* 7.8*   GFR: Estimated Creatinine Clearance: 20.9 mL/min (A) (by C-G formula based on SCr of 1.68 mg/dL (H)). Liver Function Tests:  Recent Labs Lab 02/12/2017 1038  AST 15  ALT 12*  ALKPHOS 126  BILITOT 1.1  PROT 4.9*  ALBUMIN 2.4*    Recent Labs Lab 03/02/2017 1038  LIPASE 18   No results for input(s): AMMONIA in the last 168 hours. Coagulation Profile: No results for input(s): INR, PROTIME in the last 168 hours. Cardiac Enzymes:  Recent Labs Lab 02/08/2017 1038 02/07/2017 1403 02/20/2017 1646 02/16/2017 1930  TROPONINI <0.03 <0.03 <0.03 <0.03   BNP (last 3 results) No results for input(s): PROBNP in the last 8760 hours. HbA1C: No results for input(s): HGBA1C in the last 72 hours. CBG: No results for input(s): GLUCAP in the last 168 hours. Lipid Profile: No results for input(s): CHOL, HDL, LDLCALC, TRIG, CHOLHDL, LDLDIRECT in the last 72 hours. Thyroid Function Tests:  Recent Labs  02/22/17 1937  TSH 0.758  FREET4 0.91   Anemia Panel: No results for input(s): VITAMINB12, FOLATE, FERRITIN, TIBC, IRON, RETICCTPCT in the last 72 hours. Sepsis Labs: No results for input(s): PROCALCITON, LATICACIDVEN in the last 168 hours.  No results found for this or any previous visit (from the past 240  hour(s)).       Radiology Studies: No results found.   Scheduled Meds: . arformoterol  15 mcg Nebulization BID  . aspirin EC  81 mg Oral Daily  . budesonide (PULMICORT) nebulizer solution  0.25 mg Nebulization BID  . calcitRIOL  0.25 mcg Oral Daily  . cyclophosphamide  25 mg Oral Daily  . dronabinol  2.5 mg Oral BID AC  . famotidine  20 mg Oral QHS  . feeding supplement  1 Container Oral TID BM  . finasteride  5 mg Oral Daily  . heparin  5,000 Units Subcutaneous Q8H  . ipratropium-albuterol  3 mL Nebulization Q6H  . methylPREDNISolone (SOLU-MEDROL) injection  60 mg Intravenous Daily  . metoprolol succinate  25 mg Oral Daily  . pantoprazole  40 mg Oral QAC breakfast  . PARoxetine  20 mg Oral Daily  . psyllium  1 packet Oral Daily  . simvastatin  20 mg Oral q1800  .  sodium bicarbonate  1,300 mg Oral BID  . tamsulosin  0.4 mg Oral Daily   Continuous Infusions: . sodium chloride 75 mL/hr at 02/25/17 0419  . levofloxacin (LEVAQUIN) IV 500 mg (02/25/17 1552)  . levofloxacin (LEVAQUIN) IV       LOS: 4 days    Time spent: > 35 minutes  Velvet Bathe, MD [facility] Pager [phone]  If 7PM-7AM, please contact night-coverage [email or web address] Password [facility] 02/25/2017, 4:51 PM

## 2017-02-25 NOTE — Progress Notes (Signed)
SLP Cancellation Note  Patient Details Name: Richard [name] MRN: [id number] DOB: [birth year]   Cancelled treatment:       Reason Eval/Treat Not Completed: Other (comment) (Order received; will schedule for MBS next date).  Deneise Lever, [place], CCC-SLP Speech-Language Pathologist [phone]    Aliene Altes 02/25/2017, 1:08 PM

## 2017-02-25 NOTE — Assessment & Plan Note (Signed)
-     PFT's 06/21/11   FEV1  1.60 (61%) ratio 48 and 10% better p B2,  DLCO 83%  - CT 10/04/13 severe multifocal bronchiectasis as well as numerous cavitary and      non-cavitary nodules  -   PFT's 11/22/2012 FEV1  1.52 (62%) arnd 46 and no change p B2 DLCO 89%  -    VEST rec 02/12/2014 > reported improvement 05/23/14  - 07/08/2015  extensive coaching HFA effectiveness =  90%    - 02/01/2016  After extensive coaching HFA effectiveness =    90%  - PFT's  02/01/2016  FEV1 1.34 (51 % ) ratio 53  p 4 % improvement from saba p symbicort 160 prior to study with DLCO  44/46 % corrects to 85 % for alv volume    - changed to prn augmentin x 10 days 06/09/2016 >>>  Med calendar 07/14/16  Sputum cx 07/2016 w/++ psuedomonas > pan sensitive , Cipro 750mg  x 10 07/28/2016    Try levaquin/ restart flutter since won't use vest and continue laba/ics and prednisone 20 mg per day until better then taper back to baseline of 7.5 mg  If not improving will need admit / advised   I had an extended discussion with the patient/daughter/ wife  reviewing all relevant studies completed to date and  lasting 15 to 20 minutes of a 25 minute acute visit    Each maintenance medication was reviewed in detail including most importantly the difference between maintenance and prns and under what circumstances the prns are to be triggered using an action plan format that is not reflected in the computer generated alphabetically organized AVS.    Please see AVS for specific instructions unique to this visit that I personally wrote and verbalized to the the pt in detail and then reviewed with pt  by my nurse highlighting any  changes in therapy recommended at today's visit to their plan of care.

## 2017-02-25 NOTE — Consult Note (Signed)
Referring Provider: Dr. Wendee Beavers  Primary Care Physician:  [name], MD Primary Gastroenterologist:  Dr. Fuller Plan  Reason for Consultation:  Dysphagia  HPI: Richard [name] is a 81 y.o. male with medical history significant of Wegener's granulomatosis, PUD and esophagitis, hypertension, hiatal hernia, diverticulosis, COPD, BPH, CAD status post cardiac catheterization and stent placement. Patient presented with multiple generalized and focal complaints all likely related to cardiopulmonary disease.   Dyspnea on exertion. Present for approximately the last 6 weeks. Gradual worsening.  Patient is currently being treated for acute on chronic hypoxic respiratory failure presumed to be secondary to COPD exacerbation.  While here patient complains of dysphagia.  Has history of esophageal stricture so GI was contacted.    Last EGD was in 07/2016 by Dr. Benson Norway at which time he was found to have the following:  - Benign-appearing esophageal stenosis, which was traversed. - 5 cm hiatal hernia.  EGD 10/2014 by Dr. Fuller Plan showed the following:  1. Short stricture at the gastroesophageal junction; dilated using savary dilators over guidewire 2. Erosive gastritis on the greater curve of the stomach; multiple biopsies performed 3. 6 cm hiatal hernia  He tells me that his swallowing issue has only been present again about the past 3-5 weeks.  Has trouble swallowing food and liquids when initiating swallowing.  Coughs and sputters when trying to swallow.  Food not getting stuck in chest/lower esophagus.  Reports that his breathing is terrible currently.   Past Medical History:  Diagnosis Date  . Adenomatous colon polyp   . Anemia   . Asthma   . BPH (benign prostatic hyperplasia)    sees Dr. Risa Grill, biopsy June 2015 was benign   . Bronchiectasis ([place])   . CAD (coronary artery disease)    a. [place] s/p [place] x3 and ultimately BMS to pLAD (vision BMS 3.0 x 18), POBA to CTO of Cx-OM 03/17/14 and staged  Rotablator-PCI w/ BMS (Mini Vision 2.5 x 28) to mRCA on 03/21/14 (unsuitable for CABG).  . Chronic bronchitis ([place])   . CKD (chronic kidney disease), stage IV ([place])   . COPD (chronic obstructive pulmonary disease) ([place])   . Diverticulosis   . Femoral artery pseudo-aneurysm, right ([place])    a. s/p repair. Follow up with Dr. Kellie Simmering   . Fungal infection    lungs  . GERD (gastroesophageal reflux disease)   . GI bleed    a. 10/2016 - Plavix stopped.  . Hiatal hernia   . History of blood transfusion 07/2016; 11/01/2016   "low blood; low blood"  . History of kidney stones   . HOH (hard of hearing)    bilaterally  . HTN (hypertension) 09/28/2013  . Ischemic cardiomyopathy    a. Prior EF 40-45% by echo in 2015, improved in 2016.  . Mild mitral regurgitation   . Moderate aortic stenosis by prior echocardiogram   . Peptic stricture of esophagus   . Pneumonia    "1-2 times" (11/01/2016)  . Wegener's granulomatosis ([place])    sees Dr. [name] ; "went from my lungs to my kidneys" (11/01/2016)    Past Surgical History:  Procedure Laterality Date  . CARDIAC CATHETERIZATION  03/17/2014   Procedure: CORONARY BALLOON ANGIOPLASTY;  Surgeon: Troy Sine, MD;  Location[facility] C[facility] LAB;  Service: Cardiovascular;;  . CATARACT EXTRACTION W/ INTRAOCULAR LENS  IMPLANT, BILATERAL  12/18/2012  . CHOLECYSTECTOMY N/A 12/21/2012   Procedure: LAPAROSCOPIC CHOLECYSTECTOMY WITH INTRAOPERATIVE CHOLANGIOGRAM;  Surgeon: Edward Jolly, MD;  Location: [facility] ORS;  Service: General;  Laterality: N/A;  . COLONOSCOPY  08-16-11   per Dr. Fuller Plan, diverticulosis and polyps, repeat in 5 yrs   . COLONOSCOPY N/A 08/01/2016   Procedure: COLONOSCOPY;  Surgeon: Carol Ada, MD;  Location: [facility] ENDOSCOPY;  Service: Endoscopy;  Laterality: N/A;  . ESOPHAGOGASTRODUODENOSCOPY N/A 07/31/2016   Procedure: ESOPHAGOGASTRODUODENOSCOPY (EGD);  Surgeon: Carol Ada, MD;  Location: [facility] ENDOSCOPY;  Service: Endoscopy;  Laterality: N/A;  .  ESOPHAGOGASTRODUODENOSCOPY (EGD) WITH ESOPHAGEAL DILATION  11-29-10   per Dr. Fuller Plan   . HEMATOMA EVACUATION Right 03/19/2014   Procedure: Suture repair of femoral artery with evacuation of hematoma;  Surgeon: Mal Misty, MD;  Location: [place];  Service: Vascular;  Laterality: Right;  . INGUINAL HERNIA REPAIR Right ~ 2002  . LEFT HEART CATHETERIZATION WITH CORONARY ANGIOGRAM N/A 03/14/2014   Procedure: LEFT HEART CATHETERIZATION WITH CORONARY ANGIOGRAM;  Surgeon: Leonie Man, MD;  Location: [facility] CATH LAB: pLAD 90% calcified lesion --> after D1, LAD is Subtotally occluded. mCx 100% (L-L collaterals). pRI 60-80%. mRCA diffuse 80-95%.  Marland Kitchen LUNG BIOPSY  03/2014  . PERCUTANEOUS CORONARY STENT INTERVENTION (PCI-S) N/A 03/17/2014   Procedure: PERCUTANEOUS CORONARY STENT INTERVENTION (PCI-S);  Surgeon: Troy Sine, MD;  Location: [facility] CATH LAB: Vision BMS 3.0 mm x 18 mm -- 3.2 mm); PTCA of CTO Cx-OM  . PERCUTANEOUS CORONARY STENT INTERVENTION (PCI-S) N/A 03/21/2014   Procedure: PERCUTANEOUS CORONARY STENT INTERVENTION (PCI-S);  Surgeon: Burnell Blanks, MD;  Location: [facility] CATH LAB:  Rotational Atherectomy -- Mini [place] BMS 2.5 x 28 mm --> 2.75 mm)  . RENAL BIOPSY    . TRANSTHORACIC ECHOCARDIOGRAM  10/2015; 10/2016   a. Now Moderate AD (AVA ~1.2 cm) EF 60-65%. GR 1 DD.;; b. Stable -- EF 60-65% with moderate LVH. Only noted grade 1 diastolic dysfunction. Moderate aortic stenosis with moderate calcification and thickening. (Estimated valve area between 1.2-1.3 cm)    Prior to Admission medications   Medication Sig Start Date End Date Taking? Authorizing Provider  acetaminophen (TYLENOL) 500 MG tablet Take 2 tablets (1,000 mg total) by mouth every 8 (eight) hours. Patient taking differently: Take 1,000 mg by mouth every 8 (eight) hours as needed for mild pain or moderate pain.  01/09/17  Yes Eugenie Filler, MD  albuterol (VENTOLIN HFA) 108 (90 Base) MCG/ACT inhaler Inhale 1-2 puffs into the lungs every  4 (four) hours as needed for wheezing or shortness of breath. 07/12/16  Yes Tanda Rockers, MD  aspirin EC 81 MG tablet Take 81 mg by mouth daily.   Yes [provider]  b complex vitamins tablet Take 1 tablet by mouth daily.     Yes [provider]  benzonatate (TESSALON) 200 MG capsule Take 1 capsule (200 mg total) by mouth 3 (three) times daily as needed for cough. 01/09/17  Yes Eugenie Filler, MD  bisacodyl (DULCOLAX) 5 MG EC tablet Take 5 mg by mouth 2 (two) times daily.    Yes [provider]  calcitRIOL (ROCALTROL) 0.25 MCG capsule Take 0.25 mcg by mouth daily.  09/06/15  Yes [provider]  cyclophosphamide (CYTOXAN) 25 MG capsule Take 25 mg by mouth daily. Take one capsule by mouth daily on an empty stomach 1 hour before or 2 hours after meal. 07/26/16  Yes [provider]  dextromethorphan (DELSYM) 30 MG/5ML liquid Take 30 mg by mouth at bedtime as needed for cough.   Yes [provider]  finasteride (PROSCAR) 5 MG tablet Take 1 tablet (  5 mg total) by mouth daily. 11/18/16  Yes [name], MD  guaiFENesin-codeine 100-10 MG/5ML syrup Take 10 mLs by mouth every 6 (six) hours as needed for cough. 08/23/16  Yes Eber Jones, MD  HYDROcodone-homatropine (HYDROMET) 5-1.5 MG/5ML syrup Take 5 mLs by mouth every 4 (four) hours as needed. Patient taking differently: Take 5 mLs by mouth every 4 (four) hours as needed for cough.  02/06/17  Yes [name], MD  ipratropium-albuterol (DUONEB) 0.5-2.5 (3) MG/3ML SOLN Take 3 mLs by nebulization every 4 (four) hours as needed (if inhaler is not effective in resolving shortness of breath or wheezing). 02/03/16  Yes Tanda Rockers, MD  levofloxacin (LEVAQUIN) 750 MG tablet TAKE ONE TABLET BY MOUTH ONCE DAILY AS  NEEDED  FOR  LUNGS 02/22/2017  Yes Tanda Rockers, MD  loratadine (CLARITIN) 10 MG tablet Take 10 mg by mouth daily.    Yes [provider]  Methylcellulose, Laxative, (CITRUCEL  PO) Take 1 packet by mouth daily.   Yes [provider]  metoprolol succinate (TOPROL-XL) 25 MG 24 hr tablet TAKE ONE TABLET BY MOUTH ONCE DAILY 01/26/17  Yes Hilty, Nadean Corwin, MD  ondansetron (ZOFRAN) 4 MG tablet Take 1 tablet (4 mg total) by mouth every 8 (eight) hours as needed for nausea or vomiting. 01/10/17  Yes [name], MD  oxyCODONE-acetaminophen (PERCOCET) 10-325 MG tablet Take 1 tablet by mouth every 6 (six) hours as needed for pain. 12/22/16  Yes [name], MD  predniSONE (DELTASONE) 20 MG tablet Take 20 mg by mouth daily with breakfast.   Yes [provider]  ranitidine (ZANTAC) 300 MG tablet Take 1 tablet (300 mg total) by mouth 2 (two) times daily. 11/18/16  Yes [name], MD  simethicone (MYLICON) 754 MG chewable tablet Chew 250 mg by mouth 2 (two) times daily as needed for flatulence. TAKE PER BOX FOR GAS    Yes [provider]  simvastatin (ZOCOR) 20 MG tablet TAKE ONE TABLET BY MOUTH ONCE DAILY AT  6  PM 07/07/16  Yes Hilty, Nadean Corwin, MD  sodium bicarbonate 650 MG tablet Take 2 tablets (1,300 mg total) by mouth 2 (two) times daily. NEED OV. 08/12/15  Yes Hilty, Nadean Corwin, MD  sodium chloride (OCEAN) 0.65 % SOLN nasal spray Place 1 spray into both nostrils as needed for congestion.   Yes [provider]  SYMBICORT 160-4.5 MCG/ACT inhaler INHALE TWO PUFFS BY MOUTH TWICE DAILY 06/01/16  Yes Tanda Rockers, MD  tamsulosin (FLOMAX) 0.4 MG CAPS capsule TAKE ONE CAPSULE BY MOUTH ONCE DAILY IN THE EVENING 01/13/17  Yes [name], MD  nitroGLYCERIN (NITROSTAT) 0.4 MG SL tablet Place 1 tablet (0.4 mg total) under the tongue every 5 (five) minutes x 3 doses as needed for chest pain. 03/24/15   Hilty, Nadean Corwin, MD    Current Facility-Administered Medications  Medication Dose Route Frequency Provider Last Rate Last Dose  . 0.9 %  sodium chloride infusion   Intravenous Continuous Waldemar Dickens, MD 75 mL/hr at 02/25/17 0419    . acetaminophen  (TYLENOL) tablet 650 mg  650 mg Oral Q6H PRN Waldemar Dickens, MD       Or  . acetaminophen (TYLENOL) suppository 650 mg  650 mg Rectal Q6H PRN Waldemar Dickens, MD      . albuterol (PROVENTIL) (2.5 MG/3ML) 0.083% nebulizer solution 2.5 mg  2.5 mg Nebulization Q2H PRN Waldemar Dickens, MD  2.5 mg at 02/24/17 1159  . arformoterol (BROVANA) nebulizer solution 15 mcg  15 mcg Nebulization BID Tanda Rockers, MD      . aspirin EC tablet 81 mg  81 mg Oral Daily Waldemar Dickens, MD   81 mg at 02/24/17 6389  . benzonatate (TESSALON) capsule 200 mg  200 mg Oral TID PRN Waldemar Dickens, MD   200 mg at 02/16/2017 1931  . budesonide (PULMICORT) nebulizer solution 0.25 mg  0.25 mg Nebulization BID Tanda Rockers, MD      . calcitRIOL (ROCALTROL) capsule 0.25 mcg  0.25 mcg Oral Daily Waldemar Dickens, MD   0.25 mcg at 02/24/17 3734  . cyclophosphamide (CYTOXAN) capsule 25 mg  25 mg Oral Daily Waldemar Dickens, MD   25 mg at 02/24/17 1131  . dronabinol (MARINOL) capsule 2.5 mg  2.5 mg Oral BID AC Velvet Bathe, MD   2.5 mg at 02/24/17 1629  . famotidine (PEPCID) tablet 20 mg  20 mg Oral QHS Tanda Rockers, MD      . feeding supplement (BOOST / RESOURCE BREEZE) liquid 1 Container  1 Container Oral TID BM Velvet Bathe, MD   1 Container at 02/24/17 2050  . finasteride (PROSCAR) tablet 5 mg  5 mg Oral Daily Waldemar Dickens, MD   5 mg at 02/24/17 2876  . gi cocktail (Maalox,Lidocaine,Donnatal)  30 mL Oral QID PRN Waldemar Dickens, MD      . heparin injection 5,000 Units  5,000 Units Subcutaneous Q8H Waldemar Dickens, MD   5,000 Units at 02/25/17 8115  . HYDROcodone-homatropine (HYCODAN) 5-1.5 MG/5ML syrup 5 mL  5 mL Oral Q4H PRN Waldemar Dickens, MD   5 mL at 02/24/17 2206  . ipratropium-albuterol (DUONEB) 0.5-2.5 (3) MG/3ML nebulizer solution 3 mL  3 mL Nebulization Q6H Waldemar Dickens, MD   3 mL at 02/25/17 0226  . levofloxacin (LEVAQUIN) IVPB 500 mg  500 mg Intravenous Q48H Waldemar Dickens, MD   Stopped at  02/23/17 1543  . levofloxacin (LEVAQUIN) IVPB 750 mg  750 mg Intravenous Once Waldemar Dickens, MD      . methylPREDNISolone sodium succinate (SOLU-MEDROL) 125 mg/2 mL injection 60 mg  60 mg Intravenous Daily Velvet Bathe, MD   60 mg at 02/24/17 0920  . metoprolol succinate (TOPROL-XL) 24 hr tablet 25 mg  25 mg Oral Daily Hilty, Nadean Corwin, MD      . nitroGLYCERIN (NITROSTAT) SL tablet 0.4 mg  0.4 mg Sublingual Q5 Min x 3 PRN Waldemar Dickens, MD      . ondansetron [facility]) tablet 4 mg  4 mg Oral Q6H PRN Waldemar Dickens, MD   4 mg at 02/23/17 1710   Or  . ondansetron (ZOFRAN) injection 4 mg  4 mg Intravenous Q6H PRN Waldemar Dickens, MD   4 mg at 02/22/17 1138  . oxyCODONE-acetaminophen (PERCOCET/ROXICET) 5-325 MG per tablet 1 tablet  1 tablet Oral Q6H PRN Rumbarger, Valeda Malm, RPH       And  . oxyCODONE (Oxy IR/ROXICODONE) immediate release tablet 5 mg  5 mg Oral Q6H PRN Rumbarger, Valeda Malm, RPH      . pantoprazole (PROTONIX) EC tablet 40 mg  40 mg Oral QAC breakfast Christinia Gully B, MD      . PARoxetine (PAXIL) tablet 20 mg  20 mg Oral Daily Pixie Casino, MD   20 mg at 02/24/17 7262  . psyllium (HYDROCIL/METAMUCIL) packet 1 packet  1 packet Oral Daily Waldemar Dickens, MD   1 packet at 02/24/17 [phone]  . simethicone (MYLICON) chewable tablet 80 mg  80 mg Oral Q6H PRN Waldemar Dickens, MD      . simvastatin (ZOCOR) tablet 20 mg  20 mg Oral q1800 Waldemar Dickens, MD   20 mg at 02/24/17 1629  . sodium bicarbonate tablet 1,300 mg  1,300 mg Oral BID Waldemar Dickens, MD   1,300 mg at 02/24/17 2157  . tamsulosin (FLOMAX) capsule 0.4 mg  0.4 mg Oral Daily Velvet Bathe, MD        Allergies as of 03/04/2017  . (No Known Allergies)    Family History  Problem Relation Age of Onset  . Asthma Father   . Coronary artery disease Mother   . Coronary artery disease Brother   . Coronary artery disease Brother   . Colon cancer Neg Hx   . Stomach cancer Neg Hx     Social History   Social  History  . Marital status: Married    Spouse name: N/A  . Number of children: N/A  . Years of education: N/A   Occupational History  . Retired    Social History Main Topics  . Smoking status: Never Smoker  . Smokeless tobacco: Never Used  . Alcohol use No  . Drug use: No  . Sexual activity: Not on file   Other Topics Concern  . Not on file   Social History Narrative  . No narrative on file    Review of Systems: ROS is O/W negative.  Physical Exam: Vital signs in last 24 hours: Temp:  [97.8 F (36.6 C)-98.1 F (36.7 C)] 98.1 F (36.7 C) (07/21 0555) Pulse Rate:  [65-113] 113 (07/21 0555) Resp:  [17-21] 20 (07/21 0555) BP: (104-136)/(62-79) 133/79 (07/21 0555) SpO2:  [87 %-100 %] 91 % (07/21 0555) Last BM Date: 02/19/17 General:  Alert, frail, chronically ill-appearing, but pleasant and cooperative in NAD Head:  Normocephalic and atraumatic. Eyes:  Sclera clear, no icterus.  Conjunctiva pink. Ears:  Normal auditory acuity. Mouth:  No deformity or lesions.   Lungs:  Coarse BS throughout lungs B/L.  Increased WOB noted. Heart:  Tachy.  3/6 SEM noted. Abdomen:  Soft, non-distended.  BS present.  Non-tender. Rectal:  Deferred  Msk:  Symmetrical without gross deformities.  Pulses:  Normal pulses noted. Extremities:  Without clubbing or edema. Neurologic:  Alert and oriented x 4;  grossly normal neurologically. Skin:  Intact without significant lesions or rashes. Psych:  Alert and cooperative. Normal mood and affect.  Intake/Output from previous day: 07/20 0701 - 07/21 0700 In: 1285 [P.O.:460; I.V.:825] Out: 895 [Urine:895]  Lab Results:  Recent Labs  02/23/17 0243 02/24/17 0430 02/25/17 0416  WBC 4.3 5.1 7.0  HGB 8.8* 8.7* 8.4*  HCT 28.4* 28.6* 27.6*  PLT 123* 114* 99*   BMET  Recent Labs  02/23/17 0243  NA 146*  K 5.1  CL 113*  CO2 25  GLUCOSE 163*  BUN 59*  CREATININE 1.68*  CALCIUM 7.8*   IMPRESSION/PLAN:  -Dysphagia:  Has history of  esophageal stricture requiring dilation in the past, but what he is describing and what I witnessed while observing him swallow water today is not esophageal dysphagia.  Having oropharyngeal dysphagia with problems initiating swallowing or transfer.  I will discuss with Dr. Havery Moros, but I think that MBSS would be the first place to start.  He is definitely not a candidate for EGD  until respiratory status improves. -Respiratory failure:  ? Aspiration.  Macklyn Glandon D.  02/25/2017, 9:01 AM  Pager number [phone]

## 2017-02-26 ENCOUNTER — Inpatient Hospital Stay (HOSPITAL_COMMUNITY): Payer: Medicare Other

## 2017-02-26 DIAGNOSIS — R1310 Dysphagia, unspecified: Secondary | ICD-10-CM

## 2017-02-26 DIAGNOSIS — J9601 Acute respiratory failure with hypoxia: Secondary | ICD-10-CM

## 2017-02-26 DIAGNOSIS — N184 Chronic kidney disease, stage 4 (severe): Secondary | ICD-10-CM

## 2017-02-26 MED ORDER — LIDOCAINE-EPINEPHRINE (PF) 1 %-1:200000 IJ SOLN
0.0000 mL | Freq: Once | INTRAMUSCULAR | Status: DC | PRN
  Filled 2017-02-26: qty 30

## 2017-02-26 MED ORDER — LIDOCAINE HCL 4 % EX SOLN
0.0000 mL | Freq: Once | CUTANEOUS | Status: DC | PRN
Start: 2017-02-26 — End: 2017-03-07
  Filled 2017-02-26: qty 50

## 2017-02-26 MED ORDER — LIDOCAINE HCL 2 % EX GEL
1.0000 "application " | Freq: Once | CUTANEOUS | Status: DC | PRN
  Filled 2017-02-26: qty 5

## 2017-02-26 MED ORDER — SILVER NITRATE-POT NITRATE 75-25 % EX MISC
1.0000 | Freq: Once | CUTANEOUS | Status: DC | PRN
Start: 2017-02-26 — End: 2017-03-07
  Filled 2017-02-26: qty 1

## 2017-02-26 MED ORDER — TRIPLE ANTIBIOTIC 3.5-400-5000 EX OINT
1.0000 | TOPICAL_OINTMENT | Freq: Once | CUTANEOUS | Status: DC | PRN
Start: 2017-02-26 — End: 2017-03-07
  Filled 2017-02-26: qty 1

## 2017-02-26 MED ORDER — OXYMETAZOLINE HCL 0.05 % NA SOLN
1.0000 | Freq: Once | NASAL | Status: DC | PRN
  Filled 2017-02-26: qty 15

## 2017-02-26 NOTE — Progress Notes (Addendum)
02/26/17 1000  SLP Visit Information  SLP Received On 02/26/17  Subjective  Subjective Pt anxious, SOB with transfer to [place] chair for MBS. With relaxation techniques, no s/sx of respiratory distress or WOB.  Patient/Family Stated Goal figure out swallowing problem  Pain Assessment  Pain Assessment Faces  Faces Pain Scale 6  Pain Location back  Pain Descriptors / Indicators Aching  Pain Intervention(s) Repositioned;Utilized relaxation techniques;Monitored during session  General Information  Date of Onset 03/02/2017  HPI 81 y.o.malewith medical history significant of Wegener's granulomatosis, PUD and esophagitis, hypertension, hiatal hernia, diverticulosis, COPD, BPH, CAD status post cardiac catheterization and stent placement. Patient presented with multiple generalized and focal complaints all likely related to cardiopulmonary disease. Per MD notes pt has been complaining of dysphagia with food and liquids. He has had this problem before and wasseen by Dr. Fuller Plan and reports having esophageal dilatation in the past. Orders for MBS per MD. Upper GI 12/242017: benign-appearing esophageal stenosis, 5cm hiatal hernia.    Type of Study MBS-Modified Barium Swallow Study  Previous Swallow Assessment see HPI  Diet Prior to this Study Regular;Thin liquids  Temperature Spikes Noted No  Respiratory Status Nasal cannula  History of Recent Intubation No  Behavior/Cognition Alert;Cooperative;Other (Comment) (anxious)  Oral Cavity Assessment WFL  Oral Care Completed by SLP No  Oral Cavity - Dentition Adequate natural dentition  Vision Functional for self feeding  Self-Feeding Abilities Able to feed self  Patient Positioning Upright in chair  Baseline Vocal Quality Low vocal intensity  Volitional Cough Strong  Volitional Swallow Able to elicit  Anatomy Other (Comment) (tissue prominence C6-7 consistent with CP bar)  Pharyngeal Secretions Not observed secondary MBS  Oral Motor/Sensory  Function  Overall Oral Motor/Sensory Function WFL  Oral Preparation/Oral Phase  Oral Phase Impaired  Oral - Thin  Oral - Thin Straw WFL  Oral - Solids  Oral - Regular Reduced posterior propulsion;Delayed oral transit  Oral - Puree WFL  Oral - Multi-Consistency Premature spillage (thin liquid/barium pill)  Pharyngeal Phase  Pharyngeal Phase Impaired  Pharyngeal - Thin  Pharyngeal- Thin Straw WFL  Pharyngeal - Solids  Pharyngeal- Puree WFL  Pharyngeal- Regular WFL  Pharyngeal- Multi-consistency Penetration/Aspiration before swallow;Trace aspiration;Delayed swallow initiation-pyriform sinuses  Pharyngeal Material enters airway, passes BELOW cords without attempt by patient to eject out (silent aspiration)  Cervical Esophageal Phase  Cervical Esophageal Phase Impaired  Cervical Esophageal Phase - Thin  Thin Straw Reduced cricopharyngeal relaxation;Prominent cricopharyngeal segment;Esophageal backflow into the pharynx;Esophageal backflow into cervical esophagus  Cervical Esophageal Phase - Solids  Puree Reduced cricopharyngeal relaxation;Prominent cricopharyngeal segment;Esophageal backflow into cervical esophagus;Esophageal backflow into the pharynx  Regular Reduced cricopharyngeal relaxation;Prominent cricopharyngeal segment  Multi-consistency Reduced cricopharyngeal relaxation;Prominent cricopharyngeal segment  Clinical Impression  Clinical Impression Patient presents with mild oral, moderate pharyngoesophageal and suspected primary esophageal dysphagia. Trace, silent aspiration x1 with mixed consistency (thin liquid/pill) 2/2 bolus retention in small pouch ? Zenker's diverticulum ~C6 with spillover into the airway after the swallow. No other aspiration or penetration observed. Pt with mild oral deficits with pureed and regular solids characterized by delayed oral transit and lingual pumping. Suspect oral deficits are secondary to primary esophageal dysphagia. Swallow initiation was within  normal limits at the base of tongue and occasionally valleculae. Pharyngeal stage of swallowing characterized by adequate tongue base retraction, hyolaryngeal excursion and pharyngeal constriction. Noted pooling of bolus ~C6 with sluggish passage through prominent UES, intermittent backflow into the pharynx. Subsequent dry swallows effective for clearing Periodic esophageal sweeps revealed standing column  of barium in the middle esophagus, to and fro movements of contrast. No radiologist present to confirm esophageal impairments. Recommend ENT consult re: ? Zenker's, GI f/u given esophageal backflow, reduced bolus flow through UES. Recommend regular diet with thin liquids, medications whole in puree; avoid mixed consistencies, with strict reflux precautions (upright during and for 30 minutes after meals). Pt at moderate risk for aspiration given extensive esophageal hx, esophageal backflow noted during exam. Provided education and teachback re: results to pt, daughter, provided written and verbal education re: esophageal precautions.  SLP Visit Diagnosis Dysphagia, oropharyngeal phase (R13.12);Dysphagia, pharyngoesophageal phase (R13.14)  Impact on safety and function Moderate aspiration risk  Swallow Evaluation Recommendations  Recommended Consults Consider ENT evaluation;Consider GI evaluation  SLP Diet Recommendations Regular solids;Thin liquid;No mixed consistencies  Liquid Administration via Cup;Straw  Medication Administration Whole meds with puree  Supervision Patient able to self feed  Compensations Slow rate;Small sips/bites;Multiple dry swallows after each bite/sip;Clear throat intermittently;Follow solids with liquid  Postural Changes Remain semi-upright after after feeds/meals (Comment);Seated upright at 90 degrees  Treatment Plan  Oral Care Recommendations Oral care BID  Treatment Recommendations No treatment recommended at this time  Follow up Recommendations Other (comment) (ENT/GI)   Speech Therapy Frequency (ACUTE ONLY) min 1 x/week  Treatment Duration 1 week  Interventions Patient/family education  Individuals Consulted  Consulted and Agree with Results and Recommendations Patient;Family member/caregiver;RN  Family Member Consulted daughter  Progression Toward Goals  Progression toward goals Progressing toward goals  SLP Time Calculation  SLP Start Time (ACUTE ONLY) 1020  SLP Stop Time (ACUTE ONLY) 1045  SLP Time Calculation (min) (ACUTE ONLY) 25 min  SLP Evaluations  $ SLP Speech Visit 1 Procedure  SLP Evaluations  $MBS Swallow 1 Procedure  $Swallowing Treatment 1 Procedure   Deneise Lever, [place], [place] Pathologist [phone]

## 2017-02-26 NOTE — Progress Notes (Signed)
PULMONARY / CRITICAL CARE MEDICINE   Name: Richard [name] MRN: [id number] DOB: 1936/01/22    ADMISSION DATE:  03/05/2017 CONSULTATION DATE:  02/25/17  REFERRING MD:  Wendee Beavers   CHIEF COMPLAINT:  Cough/sob/ dysphagia   HISTORY OF PRESENT ILLNESS:     6  yowm never smoker with dx of Longstanding bronchiectasis then dx with WG 03/2010 seen in office 02/20/17 on an extremely complex medical regimen but not using flutter valve and unalble to tol vest with FTT/ worsening doe x 6 weeks and persistent purulent sputum so rec empirical levaquin/ flutter valve and increase pred to 20 mg per day until improvement but came to ER 7/17  "no better" with new transient cp and Aflutter and PCCM consulted am 7/21 unable to cough up secretions well but when does are quite yellow, not bloody, with new report of dysphagia x 2 weeks not brought up as a concern in office on 02/13/15 assoc with profound anorexia and concern for dehydration but no evidence for this on lab 7/16.  PCCM service asked for imput into airways management am 7/21         SUBJECTIVE:   appears miserable but unable to articulate a specific complaint and lets his two daughters at bedside answer all the questions  VITAL SIGNS: BP 119/70 (BP Location: Right Arm)   Pulse (!) 104   Temp 98.4 F (36.9 C) (Oral)   Resp 19   Ht 5\' 7"  (1.702 m)   Wt 89 lb 14.4 oz (40.8 kg)   SpO2 90%   BMI 14.08 kg/m   FIO2 =  3lpm np   HEMODYNAMICS:    VENTILATOR SETTINGS:    INTAKE / OUTPUT: I/O last 3 completed shifts: In: 3121.3 [P.O.:325; I.V.:2696.3; IV Piggyback:100] Out: 1690 [Urine:1690]  PHYSICAL EXAMINATION: General:  Elderly wm/ appears cachectic and more chronically than acutely ill with congested rattling sounding cough and very poor cough mechanics  Wt Readings from Last 3 Encounters:  02/10/2017 89 lb 14.4 oz (40.8 kg)  02/20/17 95 lb 6.4 oz (43.3 kg)  02/06/17 103 lb (46.7 kg)    Vital signs reviewed  - Note on arrival 02 sats  91%  on 4lpm     SKIN: no rash, lesions  NODES: no lymphadenopathy  HEENT: [place]/AT, EOM- WNL, Conjuctivae- clear, PERRLA, TM-WNL, Nose- clear, Throat- clear and wnl, Mallampati III  NECK: Supple w/ fair ROM, JVD- none, normal carotid impulses w/o bruits   CHEST: insp and exp rhonchi diffusely bilaterally  HEART: RRR III/VI Sem at apex   ABDOMEN: Soft and nl EXT:  No deformities or restrictions    LABS:  BMET  Recent Labs Lab 02/15/2017 1038 02/22/17 0348 02/23/17 0243  NA 141 143 146*  K 4.9 4.5 5.1  CL 106 111 113*  CO2 24 24 25   BUN 61* 57* 59*  CREATININE 2.07* 1.76* 1.68*  GLUCOSE 107* 117* 163*    Electrolytes  Recent Labs Lab 03/03/2017 1038 02/22/17 0348 02/23/17 0243  CALCIUM 8.6* 8.0* 7.8*    CBC  Recent Labs Lab 02/23/17 0243 02/24/17 0430 02/25/17 0416  WBC 4.3 5.1 7.0  HGB 8.8* 8.7* 8.4*  HCT 28.4* 28.6* 27.6*  PLT 123* 114* 99*    Coag's No results for input(s): APTT, INR in the last 168 hours.  Sepsis Markers No results for input(s): LATICACIDVEN, PROCALCITON, O2SATVEN in the last 168 hours.  ABG  Recent Labs Lab 02/12/2017 1316  PHART 7.446  PCO2ART 30.5*  PO2ART 85.0  Liver Enzymes  Recent Labs Lab 02/05/2017 1038  AST 15  ALT 12*  ALKPHOS 126  BILITOT 1.1  ALBUMIN 2.4*    Cardiac Enzymes  Recent Labs Lab 02/22/2017 1403 02/23/2017 1646 02/13/2017 1930  TROPONINI <0.03 <0.03 <0.03    Glucose No results for input(s): GLUCAP in the last 168 hours.  Imaging   I personally reviewed images and agree with radiology impression as follows:  CXR:   03/03/2017 Stable changes of COPD with interstitial fibrosis. No acute abnormality.  STUDIES:   ANCA titers 7/22>>>  CULTURES: Sputum  7/21 > mod GNR >>>    ANTIBIOTICS: Levaquin 7/16 (aeBronchiectasis) >>>          ASSESSMENT / PLAN:   1)  Acute exac obst bronchiectasis/ never smoker   -   PFT's 06/21/11   FEV1  1.60 (61%) ratio 48 and 10% better p B2,  DLCO 83%   - CT 10/04/13 severe multifocal bronchiectasis as well as numerous cavitary and      non-cavitary nodules  -   PFT's 11/22/2012 FEV1  1.52 (62%) arnd 46 and no change p B2 DLCO 89%  -    VEST rec 02/12/2014 > reported improvement 05/23/14 then refused subsequently    - PFT's  02/01/2016  FEV1 1.34 (51 % ) ratio 53  p 4 % improvement from saba p symbicort 160 prior to study with DLCO  44/46 % corrects to 85 % for alv volume    Sputum cx 07/2016 w/++ psuedomonas > pan sensitive , Cipro 750mg  x 10 07/28/2016   In office pt admitted was not using flutter, had not followed any part of his "action plan" and did not disclose the dypsphagia and appeared to have a helpless/hopeless affect/attitude which may be due to chronic illness or be the underlying problem leading to his non-aherence   For now would continue levaquin pending ID of gnr/ note fob not a good option in patient with severe AS and can't tolerate vest due to vertebral fx so we may be approaching a checkmate situation here and need to consider palliative care/ hospice sooner rather than later though I did not specifically address this today    2)  Respiratory failure/ acute - 02 titrate to sats > 90%     3)  CRI/ WG > not sure Ctx helping any longer at this dose but certainly reluctant to push is harder in this setting  > defer rx to Renal         Christinia Gully, MD Pulmonary and [place] [phone] After 5:30 PM or weekends, use [facility] [phone]

## 2017-02-26 NOTE — Progress Notes (Signed)
PROGRESS NOTE  Patient seen on 02/24/17 note entered next day.  Richard [name]  NOM:[id number] DOB: 01-Nov-1935 DOA: 03/06/2017 PCP: [name], MD    Brief Narrative:  81 y.o. male with medical history significant of Wegener's granulomatosis, PUD and esophagitis, hypertension, hiatal hernia, diverticulosis, COPD, BPH, CAD status post cardiac catheterization and stent placement. Patient presented with multiple generalized and focal complaints all likely related to cardiopulmonary disease.   Dyspnea on exertion. Present for approximately the last 6 weeks. Gradual worsening.  Patient is currently being treated for acute on chronic hypoxic respiratory failure presumed to be secondary to COPD exacerbation.   Assessment & Plan:     Acute on chronic respiratory failure [facility]) - Patient has a complicated respiratory/pulmonary history.  - Pulmonologist consulted. - Mild Improvement on Levaquin, Dulera. Continue supportive therapy for symptom management with Tessalon and Hycodan. - Patient currently on Solu-Medrol  - Pulmonary team consulted and patient has been non compliant with their recommendations in the past, presumed to be secondary to depression or feeling of hopelessness. Pt may be nearing palliative care for goals of care/hospice, please see pulmonologist note for details.    Dysphagia -Patient seen by [facility] in the past Dr. Fuller Plan - He has required esophageal dilatation in the past. - Patient had modified barium swallow which speech therapist reported findings which may be consistent with Zenker's diverticulum given pooling of barium at ~C6. EMTs specialist subsequently consulted. Further work up pending prior to ENT recommendations.  Active Problems:   Wegener's granulomatosis ([place]) - The question is whether this is progressing and leading to worsening pulmonary dysfunction. Pt is on cytoxan - considering consulting nephrology next am.    Chronic kidney disease, stage IV  (severe) ([place]) - Serum creatinine stable    Protein-calorie malnutrition, severe ([place]) - Agree with continuing ensure - Most likely due to dysphagia. GI consulted.    Hypotension - Resolved    Chest pressure - None reported today troponin negative 3    FTT (failure to thrive) in adult - Suspect combination of principal problem and protein calorie malnutrition.    Pressure injury of skin   PAF (paroxysmal atrial fibrillation) ([place]) - Patient is currently rate controlled on metoprolol and is currently on aspirin. Suspect patient is not a anticoagulant candidate despite  Chadvasc2 score is more than 2, due to generalized weakness and increased risk of falls.   DVT prophylaxis: Heparin Code Status: Full Family Communication: discussed with daughter and wife at bedside Disposition Plan: Pending improvement in respiratory condition and work up of dysphagia   Consultants:  GI Pulmonology   Procedures: none   Antimicrobials: Levaquin   Subjective: Pt states that his breathing situation is not back at baseline but not worse  Objective: Vitals:   02/26/17 0755 02/26/17 0951 02/26/17 1222 02/26/17 1406  BP:  117/76  120/73  Pulse:  (!) 112  99  Resp:    20  Temp:    97.7 F (36.5 C)  TempSrc:    Oral  SpO2: 90%  100% 95%  Weight:      Height:        Intake/Output Summary (Last 24 hours) at 02/26/17 1454 Last data filed at 02/26/17 1300  Gross per 24 hour  Intake          2071.25 ml  Output              870 ml  Net          1201.25  ml   Filed Weights   02/14/2017 1021 03/03/2017 1643  Weight: 43.1 kg (95 lb) 40.8 kg (89 lb 14.4 oz)    Examination: Physical exam unchanged compared to 02/25/2017  General exam: Appears calm and comfortable, in nad. Respiratory system: No wheezes, prolonged expiratory phase, equal chest rise, poor inspiratory effort  Cardiovascular system: irregularly irregular,  + murmur no rubs or gallops  Gastrointestinal system: Abdomen is  nondistended, soft and nontender. No organomegaly or masses felt. Normal bowel sounds heard. Central nervous system: Alert and oriented. No focal neurological deficits. Extremities: Warm and dry  Skin: No rashes, lesions or ulcers, on limited exam  Psychiatry:  Mood & affect appropriate.   Data Reviewed: I have personally reviewed following labs and imaging studies  CBC:  Recent Labs Lab 02/20/17 1301 03/02/2017 1038 02/22/17 0348 02/23/17 0243 02/24/17 0430 02/25/17 0416  WBC 6.9 6.1 4.0 4.3 5.1 7.0  NEUTROABS 5.9 4.3  --   --   --   --   HGB 10.0* 9.9* 8.3* 8.8* 8.7* 8.4*  HCT 29.7* 31.2* 26.3* 28.4* 28.6* 27.6*  MCV 109.2* 108.7* 110.0* 111.8* 113.0* 112.7*  PLT 169.0 Platelet estimate appears normal. 153 116* 123* 114* 99*   Basic Metabolic Panel:  Recent Labs Lab 02/20/17 1301 02/13/2017 1038 02/22/17 0348 02/23/17 0243  NA 140 141 143 146*  K 4.5 4.9 4.5 5.1  CL 104 106 111 113*  CO2 28 24 24 25   GLUCOSE 108* 107* 117* 163*  BUN 59* 61* 57* 59*  CREATININE 1.85* 2.07* 1.76* 1.68*  CALCIUM 8.7 8.6* 8.0* 7.8*   GFR: Estimated Creatinine Clearance: 20.9 mL/min (A) (by C-G formula based on SCr of 1.68 mg/dL (H)). Liver Function Tests:  Recent Labs Lab 03/03/2017 1038  AST 15  ALT 12*  ALKPHOS 126  BILITOT 1.1  PROT 4.9*  ALBUMIN 2.4*    Recent Labs Lab 03/01/2017 1038  LIPASE 18   No results for input(s): AMMONIA in the last 168 hours. Coagulation Profile: No results for input(s): INR, PROTIME in the last 168 hours. Cardiac Enzymes:  Recent Labs Lab 02/06/2017 1038 02/17/2017 1403 02/25/2017 1646 02/08/2017 1930  TROPONINI <0.03 <0.03 <0.03 <0.03   BNP (last 3 results) No results for input(s): PROBNP in the last 8760 hours. HbA1C: No results for input(s): HGBA1C in the last 72 hours. CBG: No results for input(s): GLUCAP in the last 168 hours. Lipid Profile: No results for input(s): CHOL, HDL, LDLCALC, TRIG, CHOLHDL, LDLDIRECT in the last 72  hours. Thyroid Function Tests: No results for input(s): TSH, T4TOTAL, FREET4, T3FREE, THYROIDAB in the last 72 hours. Anemia Panel: No results for input(s): VITAMINB12, FOLATE, FERRITIN, TIBC, IRON, RETICCTPCT in the last 72 hours. Sepsis Labs: No results for input(s): PROCALCITON, LATICACIDVEN in the last 168 hours.  Recent Results (from the past 240 hour(s))  Culture, respiratory (NON-Expectorated)     Status: None (Preliminary result)   Collection Time: 02/25/17  4:03 PM  Result Value Ref Range Status   Specimen Description INDUCED SPUTUM  Final   Special Requests NONE  Final   Gram Stain   Final    ABUNDANT WBC PRESENT,BOTH PMN AND MONONUCLEAR FEW SQUAMOUS EPITHELIAL CELLS PRESENT MODERATE GRAM NEGATIVE RODS MODERATE GRAM POSITIVE COCCI IN CHAINS IN CLUSTERS FEW GRAM POSITIVE RODS RARE BUDDING YEAST SEEN    Culture TOO YOUNG TO READ  Final   Report Status PENDING  Incomplete         Radiology Studies: Dg Chest Port 1 [address]  Result Date: 02/26/2017 CLINICAL DATA:  Respiratory failure EXAM: PORTABLE CHEST 1 VIEW COMPARISON:  02/20/2017 FINDINGS: Multifocal patchy opacities in the bilateral upper lobes and to a lesser extent the right lower lobe. This has progressed since the prior, particularly in the left upper lobe, favoring multifocal pneumonia. Suspected small right pleural effusion, new.  No pneumothorax. Mild cardiomegaly. IMPRESSION: Multifocal patchy opacities, upper lobe predominant, progressed from the prior and favoring multifocal pneumonia. Small right pleural effusion, new. Electronically Signed   By: Julian Hy M.D.   On: 02/26/2017 13:43   Dg Swallowing Func-speech Pathology  Result Date: 02/26/2017 Objective Swallowing Evaluation: Type of Study: MBS-Modified Barium Swallow Study Patient Details Name: Richard [name] MRN: [id number] Date of Birth: December 02, 1935 Today's Date: 02/26/2017 Time: SLP Start Time (ACUTE ONLY): 1020-SLP Stop Time (ACUTE ONLY): 1045 SLP  Time Calculation (min) (ACUTE ONLY): 25 min Past Medical History: Past Medical History: Diagnosis Date . Adenomatous colon polyp  . Anemia  . Asthma  . BPH (benign prostatic hyperplasia)   sees Dr. Risa Grill, biopsy June 2015 was benign  . Bronchiectasis ([place])  . CAD (coronary artery disease)   a. [place] s/p [place] x3 and ultimately BMS to pLAD (vision BMS 3.0 x 18), POBA to CTO of Cx-OM 03/17/14 and staged Rotablator-PCI w/ BMS (Mini Vision 2.5 x 28) to mRCA on 03/21/14 (unsuitable for CABG). . Chronic bronchitis ([place])  . CKD (chronic kidney disease), stage IV ([place])  . COPD (chronic obstructive pulmonary disease) ([place])  . Diverticulosis  . Femoral artery pseudo-aneurysm, right ([place])   a. s/p repair. Follow up with Dr. Kellie Simmering  . Fungal infection   lungs . GERD (gastroesophageal reflux disease)  . GI bleed   a. 10/2016 - Plavix stopped. . Hiatal hernia  . History of blood transfusion 07/2016; 11/01/2016  "low blood; low blood" . History of kidney stones  . HOH (hard of hearing)   bilaterally . HTN (hypertension) 09/28/2013 . Ischemic cardiomyopathy   a. Prior EF 40-45% by echo in 2015, improved in 2016. . Mild mitral regurgitation  . Moderate aortic stenosis by prior echocardiogram  . Peptic stricture of esophagus  . Pneumonia   "1-2 times" (11/01/2016) . Wegener's granulomatosis ([place])   sees Dr. [name] ; "went from my lungs to my kidneys" (11/01/2016) Past Surgical History: Past Surgical History: Procedure Laterality Date . CARDIAC CATHETERIZATION  03/17/2014  Procedure: CORONARY BALLOON ANGIOPLASTY;  Surgeon: Troy Sine, MD;  Location: [facility] CATH LAB;  Service: Cardiovascular;; . CATARACT EXTRACTION W/ INTRAOCULAR LENS  IMPLANT, BILATERAL  12/18/2012 . CHOLECYSTECTOMY N/A 12/21/2012  Procedure: LAPAROSCOPIC CHOLECYSTECTOMY WITH INTRAOPERATIVE CHOLANGIOGRAM;  Surgeon: Edward Jolly, MD;  Location: [facility] ORS;  Service: General;  Laterality: N/A; . COLONOSCOPY  08-16-11  per Dr. Fuller Plan, diverticulosis and polyps, repeat in 5 yrs  .  COLONOSCOPY N/A 08/01/2016  Procedure: COLONOSCOPY;  Surgeon: Carol Ada, MD;  Location: [facility] ENDOSCOPY;  Service: Endoscopy;  Laterality: N/A; . ESOPHAGOGASTRODUODENOSCOPY N/A 07/31/2016  Procedure: ESOPHAGOGASTRODUODENOSCOPY (EGD);  Surgeon: Carol Ada, MD;  Location: [facility];  Service: Endoscopy;  Laterality: N/A; . ESOPHAGOGASTRODUODENOSCOPY (EGD) WITH ESOPHAGEAL DILATION  11-29-10  per Dr. Fuller Plan  . HEMATOMA EVACUATION Right 03/19/2014  Procedure: Suture repair of femoral artery with evacuation of hematoma;  Surgeon: Mal Misty, MD;  Location: [place];  Service: Vascular;  Laterality: Right; . INGUINAL HERNIA REPAIR Right ~ 2002 . LEFT HEART CATHETERIZATION WITH CORONARY ANGIOGRAM N/A 03/14/2014  Procedure: LEFT HEART CATHETERIZATION WITH CORONARY ANGIOGRAM;  Surgeon: Leonie Man, MD;  Location: [place] CATH LAB: pLAD 90% calcified lesion --> after D1, LAD is Subtotally occluded. mCx 100% (L-L collaterals). pRI 60-80%. mRCA diffuse 80-95%. Marland Kitchen LUNG BIOPSY  03/2014 . PERCUTANEOUS CORONARY STENT INTERVENTION (PCI-S) N/A 03/17/2014  Procedure: PERCUTANEOUS CORONARY STENT INTERVENTION (PCI-S);  Surgeon: Troy Sine, MD;  Location: [facility] CATH LAB: Vision BMS 3.0 mm x 18 mm -- 3.2 mm); PTCA of CTO Cx-OM . PERCUTANEOUS CORONARY STENT INTERVENTION (PCI-S) N/A 03/21/2014  Procedure: PERCUTANEOUS CORONARY STENT INTERVENTION (PCI-S);  Surgeon: Burnell Blanks, MD;  Location: [facility] CATH LAB:  Rotational Atherectomy -- Mini Trek BMS 2.5 x 28 mm --> 2.75 mm) . RENAL BIOPSY   . TRANSTHORACIC ECHOCARDIOGRAM  10/2015; 10/2016  a. Now Moderate AD (AVA ~1.2 cm) EF 60-65%. GR 1 DD.;; b. Stable -- EF 60-65% with moderate LVH. Only noted grade 1 diastolic dysfunction. Moderate aortic stenosis with moderate calcification and thickening. (Estimated valve area between 1.2-1.3 cm) HPI: 81 y.o.malewith medical history significant of Wegener's granulomatosis, PUD and esophagitis, hypertension, hiatal hernia, diverticulosis, COPD,  BPH, CAD status post cardiac catheterization and stent placement. Patient presented with multiple generalized and focal complaints all likely related to cardiopulmonary disease. Per MD notes pt has been complaining of dysphagia with food and liquids. He has had this problem before and wasseen by Dr. Fuller Plan and reports having esophageal dilatation in the past. Orders for MBS per MD. Upper GI 12/242017: benign-appearing esophageal stenosis, 5cm hiatal hernia.   Subjective: Pt anxious, SOB with transfer to [place] chair for MBS Assessment / Plan / Recommendation CHL IP CLINICAL IMPRESSIONS 02/26/2017 Clinical Impression Patient presents with mild oral, moderate pharyngoesophageal and suspected primary esophageal dysphagia. Trace, silent aspiration x1 with mixed consistency (thin liquid/pill) 2/2 bolus retention in small pouch ? Zenker's diverticulum ~C6 with spillover into the airway after the swallow. No other aspiration or penetration observed. Pt with mild oral deficits with pureed and regular solids characterized by delayed oral transit and lingual pumping. Suspect oral deficits are secondary to primary esophageal dysphagia. Swallow initiation was within normal limits at the base of tongue and occasionally valleculae. Pharyngeal stage of swallowing characterized by adequate tongue base retraction, hyolaryngeal excursion and pharyngeal constriction. Noted pooling of bolus ~C6 with sluggish passage through prominent UES, intermittent backflow into the pharynx. Subsequent dry swallows effective for clearing Periodic esophageal sweeps revealed standing column of barium in the middle esophagus, to and fro movements of contrast. No radiologist present to confirm esophageal impairments. Recommend ENT consult re: ? Zenker's, GI f/u given esophageal backflow, reduced bolus flow through UES. Recommend regular diet with thin liquids, medications whole in puree; avoid mixed consistencies, with strict reflux precautions (upright  during and for 30 minutes after meals). Pt at moderate risk for aspiration given extensive esophageal hx, esophageal backflow noted during exam. Provided education and teachback to pt/daughter re: results and written/verbal education re: esophageal precautions. SLP Visit Diagnosis Dysphagia, oropharyngeal phase (R13.12);Dysphagia, pharyngoesophageal phase (R13.14) Attention and concentration deficit following -- Frontal lobe and executive function deficit following -- Impact on safety and function Moderate aspiration risk   CHL IP TREATMENT RECOMMENDATION 02/26/2017 Treatment Recommendations No treatment recommended at this time   No flowsheet data found. CHL IP DIET RECOMMENDATION 02/26/2017 SLP Diet Recommendations Regular solids;Thin liquid;No mixed consistencies Liquid Administration via Cup;Straw Medication Administration Whole meds with puree Compensations Slow rate;Small sips/bites;Multiple dry swallows after each bite/sip;Clear throat intermittently;Follow solids with liquid Postural Changes Remain semi-upright after after feeds/meals (Comment);Seated upright at 90 degrees   CHL IP OTHER RECOMMENDATIONS  02/26/2017 Recommended Consults Consider ENT evaluation;Consider GI evaluation Oral Care Recommendations Oral care BID Other Recommendations --   CHL IP FOLLOW UP RECOMMENDATIONS 02/26/2017 Follow up Recommendations Other (comment)   CHL IP FREQUENCY AND DURATION 02/26/2017 Speech Therapy Frequency (ACUTE ONLY) min 1 x/week Treatment Duration 1 week      CHL IP ORAL PHASE 02/26/2017 Oral Phase Impaired Oral - Pudding Teaspoon -- Oral - Pudding Cup -- Oral - Honey Teaspoon -- Oral - Honey Cup -- Oral - Nectar Teaspoon -- Oral - Nectar Cup -- Oral - Nectar Straw -- Oral - Thin Teaspoon -- Oral - Thin Cup -- Oral - Thin Straw WFL Oral - Puree WFL Oral - Mech Soft -- Oral - Regular Reduced posterior propulsion;Delayed oral transit Oral - Multi-Consistency Premature spillage Oral - Pill -- Oral Phase - Comment --  CHL  IP PHARYNGEAL PHASE 02/26/2017 Pharyngeal Phase Impaired Pharyngeal- Pudding Teaspoon -- Pharyngeal -- Pharyngeal- Pudding Cup -- Pharyngeal -- Pharyngeal- Honey Teaspoon -- Pharyngeal -- Pharyngeal- Honey Cup -- Pharyngeal -- Pharyngeal- Nectar Teaspoon -- Pharyngeal -- Pharyngeal- Nectar Cup -- Pharyngeal -- Pharyngeal- Nectar Straw -- Pharyngeal -- Pharyngeal- Thin Teaspoon -- Pharyngeal -- Pharyngeal- Thin Cup -- Pharyngeal -- Pharyngeal- Thin Straw WFL Pharyngeal -- Pharyngeal- Puree WFL Pharyngeal -- Pharyngeal- Mechanical Soft -- Pharyngeal -- Pharyngeal- Regular WFL Pharyngeal -- Pharyngeal- Multi-consistency Penetration/Aspiration before swallow;Trace aspiration;Delayed swallow initiation-pyriform sinuses Pharyngeal Material enters airway, passes BELOW cords without attempt by patient to eject out (silent aspiration) Pharyngeal- Pill -- Pharyngeal -- Pharyngeal Comment --  CHL IP CERVICAL ESOPHAGEAL PHASE 02/26/2017 Cervical Esophageal Phase Impaired Pudding Teaspoon -- Pudding Cup -- Honey Teaspoon -- Honey Cup -- Nectar Teaspoon -- Nectar Cup -- Nectar Straw -- Thin Teaspoon -- Thin Cup -- Thin Straw Reduced cricopharyngeal relaxation;Prominent cricopharyngeal segment;Esophageal backflow into the pharynx;Esophageal backflow into cervical esophagus Puree Reduced cricopharyngeal relaxation;Prominent cricopharyngeal segment;Esophageal backflow into cervical esophagus;Esophageal backflow into the pharynx Mechanical Soft -- Regular Reduced cricopharyngeal relaxation;Prominent cricopharyngeal segment Multi-consistency Reduced cricopharyngeal relaxation;Prominent cricopharyngeal segment Pill -- Cervical Esophageal Comment -- Deneise Lever, MS, CCC-SLP Speech-Language Pathologist [phone] No flowsheet data found. Aliene Altes 02/26/2017, 2:36 PM                Scheduled Meds: . arformoterol  15 mcg Nebulization BID  . aspirin EC  81 mg Oral Daily  . budesonide (PULMICORT) nebulizer solution  0.25 mg  Nebulization BID  . calcitRIOL  0.25 mcg Oral Daily  . cyclophosphamide  25 mg Oral Daily  . dronabinol  2.5 mg Oral BID AC  . famotidine  20 mg Oral QHS  . feeding supplement  1 Container Oral TID BM  . finasteride  5 mg Oral Daily  . heparin  5,000 Units Subcutaneous Q8H  . methylPREDNISolone (SOLU-MEDROL) injection  60 mg Intravenous Daily  . metoprolol succinate  25 mg Oral Daily  . pantoprazole  40 mg Oral QAC breakfast  . PARoxetine  20 mg Oral Daily  . psyllium  1 packet Oral Daily  . simvastatin  20 mg Oral q1800  . sodium bicarbonate  1,300 mg Oral BID  . tamsulosin  0.4 mg Oral Daily   Continuous Infusions: . sodium chloride 75 mL/hr at 02/26/17 0510  . levofloxacin (LEVAQUIN) IV       LOS: 5 days    Time spent: > 35 minutes  Velvet Bathe, MD [facility] Pager [phone]  If 7PM-7AM, please contact night-coverage [email or web address] Password [facility] 02/26/2017, 2:53 PM

## 2017-02-26 NOTE — Progress Notes (Signed)
      Progress Note   Subjective  Patient feels about the same. States he feels poorly today. Awaiting speech path evaluation.    Objective   Vital signs in last 24 hours: Temp:  [97.6 F (36.4 C)-98.4 F (36.9 C)] 98.4 F (36.9 C) (07/22 0544) Pulse Rate:  [104-126] 104 (07/22 0544) Resp:  [19-20] 19 (07/22 0544) BP: (112-125)/(59-81) 119/70 (07/22 0544) SpO2:  [90 %-98 %] 90 % (07/22 0755) Last BM Date: 02/19/17 General:    white male lying in bed, on oxygen Heart:  Regular rate and rhythm;  Lungs: Respirations even and somewhat labored, coarse BS diffusely bilaterally Abdomen:  Soft, nontender and nondistended.. Extremities:  Without edema. Neurologic:  Alert and oriented,  grossly normal neurologically. Psych:  Cooperative. Normal mood and affect.  Intake/Output from previous day: 07/21 0701 - 07/22 0700 In: 2296.3 [P.O.:325; I.V.:1871.3; IV Piggyback:100] Out: 2440 [Urine:1445] Intake/Output this shift: No intake/output data recorded.  Lab Results:  Recent Labs  02/24/17 0430 02/25/17 0416  WBC 5.1 7.0  HGB 8.7* 8.4*  HCT 28.6* 27.6*  PLT 114* 99*   BMET No results for input(s): NA, K, CL, CO2, GLUCOSE, BUN, CREATININE, CALCIUM in the last 72 hours. LFT No results for input(s): PROT, ALBUMIN, AST, ALT, ALKPHOS, BILITOT, BILIDIR, IBILI in the last 72 hours. PT/INR No results for input(s): LABPROT, INR in the last 72 hours.  Studies/Results: No results found.     Assessment / Plan:   81 y/o male with Wegener's / bronchiectasis, admitted with acute on chronic respiratory failure, failure to thrive. We were consulted to evaluate the patient's dysphagia, ongoing for several weeks.   While he does have a history of a distal esophageal stricture on last EGD on 07/2016, his symptoms of dysphagia to liquids and solids, with coughing during swallows are concerning for oropharyngeal etiology. Recommend modified barium swallow with speech pathology evaluation  first. Pending this result, while it's possible he may eventually benefit an EGD with dilation of the UES and distal esophageal stricture, but his pulmonary status is poor right now, high risk for sedation / endoscopy, case would need to be done with MAC when his pulmonary status is improved.   Call with questions, Dr. Loletha Carrow assuming the GI service tomorrow.  Concepcion Cellar, MD [facility] Pager [phone]

## 2017-02-26 NOTE — Significant Event (Signed)
Rapid Response Event Note  Overview: Called by RN for respiratory distress Time Called: 1211 Arrival Time: 1215 Event Type: Respiratory  Initial Focused Assessment:  Called by RN for respiratory distress.  On my arrival to patients room, RN and family at bedside.  Patient is on NRB, with increased WOB.  PAtient placed on 6 lPM nasal cannula sats 100% decreased to 4 LPM nasal cannula sats still 100% RR 26.     Interventions:  Breath rhonchi bilaterally.  Skin warm and dry.  Dr. Wendee Beavers called and notified.  PCXR ordered.    Plan of Care (if not transferred):  RN to monitor and call if assistance needed  Event Summary:   at      at          [facility]

## 2017-02-27 ENCOUNTER — Inpatient Hospital Stay (HOSPITAL_COMMUNITY): Payer: Medicare Other

## 2017-02-27 DIAGNOSIS — R1312 Dysphagia, oropharyngeal phase: Secondary | ICD-10-CM

## 2017-02-27 DIAGNOSIS — Z7189 Other specified counseling: Secondary | ICD-10-CM

## 2017-02-27 LAB — CBC
HCT: 27.6 % — ABNORMAL LOW (ref 39.0–52.0)
HEMATOCRIT: 25.2 % — AB (ref 39.0–52.0)
Hemoglobin: 7.7 g/dL — ABNORMAL LOW (ref 13.0–17.0)
Hemoglobin: 8.4 g/dL — ABNORMAL LOW (ref 13.0–17.0)
MCH: 34.4 pg — AB (ref 26.0–34.0)
MCH: 34.3 pg — AB (ref 26.0–34.0)
MCHC: 30.6 g/dL (ref 30.0–36.0)
MCHC: 30.4 g/dL (ref 30.0–36.0)
MCV: 112.5 fL — ABNORMAL HIGH (ref 78.0–100.0)
MCV: 112.7 fL — AB (ref 78.0–100.0)
PLATELETS: 99 10*3/uL — AB (ref 150–400)
Platelets: 69 10*3/uL — ABNORMAL LOW (ref 150–400)
RBC: 2.24 MIL/uL — ABNORMAL LOW (ref 4.22–5.81)
RBC: 2.45 MIL/uL — ABNORMAL LOW (ref 4.22–5.81)
RDW: 17.4 % — AB (ref 11.5–15.5)
RDW: 17.2 % — AB (ref 11.5–15.5)
WBC: 5.5 10*3/uL (ref 4.0–10.5)
WBC: 7 10*3/uL (ref 4.0–10.5)

## 2017-02-27 LAB — BASIC METABOLIC PANEL
Anion gap: 6 (ref 5–15)
BUN: 34 mg/dL — AB (ref 6–20)
CALCIUM: 7.9 mg/dL — AB (ref 8.9–10.3)
CO2: 25 mmol/L (ref 22–32)
Chloride: 115 mmol/L — ABNORMAL HIGH (ref 101–111)
Creatinine, Ser: 1.19 mg/dL (ref 0.61–1.24)
GFR calc Af Amer: >60 mL/min (ref >60)
GFR, EST NON AFRICAN AMERICAN: 57 mL/min — AB (ref >60)
GLUCOSE: 150 mg/dL — AB (ref 65–99)
Potassium: 4.4 mmol/L (ref 3.5–5.1)
Sodium: 146 mmol/L — ABNORMAL HIGH (ref 135–145)

## 2017-02-27 MED ORDER — VANCOMYCIN HCL 500 MG IV SOLR
500.0000 mg | INTRAVENOUS | Status: DC
  Administered 2017-02-28 – 2017-03-01 (×2): 500 mg via INTRAVENOUS
  Filled 2017-02-27 (×3): qty 500

## 2017-02-27 MED ORDER — VANCOMYCIN HCL IN DEXTROSE 750-5 MG/150ML-% IV SOLN
750.0000 mg | Freq: Once | INTRAVENOUS | Status: AC
  Administered 2017-02-27: 750 mg via INTRAVENOUS
  Filled 2017-02-27: qty 150

## 2017-02-27 MED ORDER — DEXTROSE 5 % IV SOLN
1.0000 g | INTRAVENOUS | Status: DC
  Administered 2017-02-27 – 2017-03-07 (×9): 1 g via INTRAVENOUS
  Filled 2017-02-27 (×9): qty 1

## 2017-02-27 MED ORDER — SODIUM CHLORIDE 3 % IN NEBU
4.0000 mL | INHALATION_SOLUTION | Freq: Every day | RESPIRATORY_TRACT | Status: DC
  Administered 2017-02-28: 4 mL via RESPIRATORY_TRACT
  Filled 2017-02-27 (×2): qty 4

## 2017-02-27 NOTE — Progress Notes (Signed)
PROGRESS NOTE  Patient seen on 02/24/17 note entered next day.  Richard [name]  ENI:[id number] DOB: January 30, 1936 DOA: 02/19/2017 PCP: [name], MD    Brief Narrative:  81 y.o. male with medical history significant of Wegener's granulomatosis, PUD and esophagitis, hypertension, hiatal hernia, diverticulosis, COPD, BPH, CAD status post cardiac catheterization and stent placement. Patient presented with multiple generalized and focal complaints all likely related to cardiopulmonary disease.   Dyspnea on exertion. Present for approximately the last 6 weeks. Gradual worsening.  Patient is currently being treated for acute on chronic hypoxic respiratory failure presumed to be secondary to COPD exacerbation.   Assessment & Plan:     Acute on chronic respiratory failure [facility]) - Patient has a complicated respiratory/pulmonary history.  - Pulmonologist consulted. - Mild Improvement on Levaquin, Dulera. Continue supportive therapy for symptom management with Tessalon and Hycodan. - Patient currently on Solu-Medrol  - Pulmonary team consulted and assiting. Patient is not chest vest candidate due to cervical fx. I have broadened antibiotics given that patient has had prolonged exposure to levaquin in the past, x ray of chest reporting progression of pneumonia and recent hospitalization (less than 1 month ago) as such will treat as HCAP.  Discontinued cytoxan in liue of active infection and per pulm recs. Pulmonary team is leaning towards palliative care as such I have consulted or left voice mail for palliative care team and placed order.   Dysphagia -Patient seen by [facility] in the past Dr. Fuller Plan - Patient was evaluated by ENT and GI specialist who did not recommend any procedural assistance. Given current delicate status with patient and his respiratory status they're both leaning towards palliative care. I have placed consult to palliative care will follow up with close care. 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 There also recommended continued physical rehabilitation swallowing therapy. They report no need for surgical intervention, they be an ENT specialist. At that juncture with feeding tube as possible future modality was introduced.  Active Problems:   Wegener's granulomatosis ([place]) - Given active infection recommendation by pulm was for discontinuation of cytoxan    Chronic kidney disease, stage IV (severe) (HCC) - Serum creatinine stable    Protein-calorie malnutrition, severe (HCC) - Agree with continuing ensure - Most likely due to dysphagia. GI consulted.    Hypotension - Resolved    Chest pressure - None reported today troponin negative 3    FTT (failure to thrive) in adult - Suspect combination of principal problem and protein calorie malnutrition.    Pressure injury of skin   PAF (paroxysmal atrial fibrillation) ([place]) - Patient is currently rate controlled on metoprolol and is currently on aspirin. Suspect patient is not a anticoagulant candidate despite  Chadvasc2 score is more than 2, due to generalized weakness and increased risk of falls.   DVT prophylaxis: Heparin Code Status: Full Family Communication: discussed with daughter at bedside Disposition Plan: Pending palliative evaluation and goals of care delineation   Consultants:  GI Pulmonology ENT Palliative care   Procedures: none   Antimicrobials: Levaquin   Subjective: Pt was somnolent but arousable. Questions were asked by daughter which were answered to her satisfaction.  Objective: Vitals:   02/27/17 0635 02/27/17 0835 02/27/17 0838 02/27/17 1533  BP: 112/63   134/72  Pulse: (!) 111   96  Resp: 20  17  Temp: 97.8 F (36.6 C)   98.3 F (36.8 C)  TempSrc:    Oral  SpO2: 93% 92% 92% 97%   Weight:      Height:        Intake/Output Summary (Last 24 hours) at 02/27/17 1711 Last data filed at 02/27/17 1213  Gross per 24 hour  Intake                0 ml  Output              700 ml  Net             -700 ml   Filed Weights   02/11/2017 1021 02/13/2017 1643  Weight: 43.1 kg (95 lb) 40.8 kg (89 lb 14.4 oz)    Examination:   General exam: Patient is somnolent but arousable, in no acute distress Respiratory system: Equal chest rise, no cyanosis, inaudible wheezes Cardiovascular system: irregularly irregular,  + murmur no rubs or gallops  Gastrointestinal system: Abdomen is nondistended, soft and nontender. No organomegaly or masses felt. Normal bowel sounds heard. Central nervous system: Alert and oriented. No focal neurological deficits. Extremities: Warm and dry  Skin: No rashes, lesions or ulcers, on limited exam  Psychiatry:  Difficult to assess secondary to hypersomnolence   Data Reviewed: I have personally reviewed following labs and imaging studies  CBC:  Recent Labs Lab 02/28/2017 1038 02/22/17 0348 02/23/17 0243 02/24/17 0430 02/25/17 0416 02/27/17 0013  WBC 6.1 4.0 4.3 5.1 7.0 5.5  NEUTROABS 4.3  --   --   --   --   --   HGB 9.9* 8.3* 8.8* 8.7* 8.4* 7.7*  HCT 31.2* 26.3* 28.4* 28.6* 27.6* 25.2*  MCV 108.7* 110.0* 111.8* 113.0* 112.7* 112.5*  PLT 153 116* 123* 114* 99* 69*   Basic Metabolic Panel:  Recent Labs Lab 03/06/2017 1038 02/22/17 0348 02/23/17 0243 02/27/17 0013  NA 141 143 146* 146*  K 4.9 4.5 5.1 4.4  CL 106 111 113* 115*  CO2 24 24 25 25   GLUCOSE 107* 117* 163* 150*  BUN 61* 57* 59* 34*  CREATININE 2.07* 1.76* 1.68* 1.19  CALCIUM 8.6* 8.0* 7.8* 7.9*   GFR: Estimated Creatinine Clearance: 29.5 mL/min (by C-G formula based on SCr of 1.19 mg/dL). Liver Function Tests:  Recent Labs Lab 02/11/2017 1038  AST 15  ALT 12*  ALKPHOS 126  BILITOT 1.1  PROT 4.9*  ALBUMIN 2.4*    Recent Labs Lab 02/06/2017 1038  LIPASE 18   No  results for input(s): AMMONIA in the last 168 hours. Coagulation Profile: No results for input(s): INR, PROTIME in the last 168 hours. Cardiac Enzymes:  Recent Labs Lab 02/12/2017 1038 03/05/2017 1403 03/01/2017 1646 02/15/2017 1930  TROPONINI <0.03 <0.03 <0.03 <0.03   BNP (last 3 results) No results for input(s): PROBNP in the last 8760 hours. HbA1C: No results for input(s): HGBA1C in the last 72 hours. CBG: No results for input(s): GLUCAP in the last 168 hours. Lipid Profile: No results for input(s): CHOL, HDL, LDLCALC, TRIG, CHOLHDL, LDLDIRECT in the last 72 hours. Thyroid Function Tests: No results for input(s): TSH, T4TOTAL, FREET4, T3FREE, THYROIDAB in the last 72 hours. Anemia Panel: No results for input(s): VITAMINB12, FOLATE, FERRITIN, TIBC, IRON, RETICCTPCT in the last 72 hours. Sepsis Labs: No results for input(s): PROCALCITON, LATICACIDVEN in the last 168 hours.  Recent Results (from the past 240 hour(s))  Culture, respiratory (NON-Expectorated)     Status: None (Preliminary result)  Collection Time: 02/25/17  4:03 PM  Result Value Ref Range Status   Specimen Description INDUCED SPUTUM  Final   Special Requests NONE  Final   Gram Stain   Final    ABUNDANT WBC PRESENT,BOTH PMN AND MONONUCLEAR FEW SQUAMOUS EPITHELIAL CELLS PRESENT MODERATE GRAM NEGATIVE RODS MODERATE GRAM POSITIVE COCCI IN CHAINS IN CLUSTERS FEW GRAM POSITIVE RODS RARE BUDDING YEAST SEEN    Culture CULTURE REINCUBATED FOR BETTER GROWTH  Final   Report Status PENDING  Incomplete         Radiology Studies: Dg Esophagus  Result Date: 02/27/2017 CLINICAL DATA:  Dysphagia EXAM: ESOPHOGRAM/BARIUM SWALLOW TECHNIQUE: Single contrast examination was performed using  and. FLUOROSCOPY TIME:  Fluoroscopy Time:  1 minutes 6 seconds Radiation Exposure Index (if provided by the fluoroscopic device): Number of Acquired Spot Images: 0 COMPARISON:  Modified barium swallow performed yesterday FINDINGS: Study is  limited by patient's condition and inability to swallow large volumes, only small sips for noted. There is mild narrowing in the proximal cervical esophagus. When compared to prior modified barium swallow, this may be related to prominent cricopharyngeus impression. No diverticulum noted. Extensive tertiary contractions throughout the remainder of the esophagus. Large hiatal hernia noted. IMPRESSION: Mild narrowing in the proximal cervical esophagus may be related to prominent cricopharyngeus impression. No diverticulum. Large hiatal hernia. Dysmotility, esophageal spasm. Electronically Signed   By: Rolm Baptise M.D.   On: 02/27/2017 08:36   Dg Chest Port 1 View  Result Date: 02/26/2017 CLINICAL DATA:  Respiratory failure EXAM: PORTABLE CHEST 1 VIEW COMPARISON:  02/18/2017 FINDINGS: Multifocal patchy opacities in the bilateral upper lobes and to a lesser extent the right lower lobe. This has progressed since the prior, particularly in the left upper lobe, favoring multifocal pneumonia. Suspected small right pleural effusion, new.  No pneumothorax. Mild cardiomegaly. IMPRESSION: Multifocal patchy opacities, upper lobe predominant, progressed from the prior and favoring multifocal pneumonia. Small right pleural effusion, new. Electronically Signed   By: Julian Hy M.D.   On: 02/26/2017 13:43   Dg Swallowing Func-speech Pathology  Result Date: 02/26/2017 Objective Swallowing Evaluation: Type of Study: MBS-Modified Barium Swallow Study Patient Details Name: Richard [name] MRN: [id number] Date of Birth: 1936/07/26 Today's Date: 02/26/2017 Time: SLP Start Time (ACUTE ONLY): 1020-SLP Stop Time (ACUTE ONLY): 1045 SLP Time Calculation (min) (ACUTE ONLY): 25 min Past Medical History: Past Medical History: Diagnosis Date . Adenomatous colon polyp  . Anemia  . Asthma  . BPH (benign prostatic hyperplasia)   sees Dr. Risa Grill, biopsy June 2015 was benign  . Bronchiectasis ([place])  . CAD (coronary artery disease)   a.  [place] s/p [place] x3 and ultimately BMS to pLAD (vision BMS 3.0 x 18), POBA to CTO of Cx-OM 03/17/14 and staged Rotablator-PCI w/ BMS (Mini Vision 2.5 x 28) to mRCA on 03/21/14 (unsuitable for CABG). . Chronic bronchitis ([place])  . CKD (chronic kidney disease), stage IV ([place])  . COPD (chronic obstructive pulmonary disease) ([place])  . Diverticulosis  . Femoral artery pseudo-aneurysm, right ([place])   a. s/p repair. Follow up with Dr. Kellie Simmering  . Fungal infection   lungs . GERD (gastroesophageal reflux disease)  . GI bleed   a. 10/2016 - Plavix stopped. . Hiatal hernia  . History of blood transfusion 07/2016; 11/01/2016  "low blood; low blood" . History of kidney stones  . HOH (hard of hearing)   bilaterally . HTN (hypertension) 09/28/2013 . Ischemic cardiomyopathy   a. Prior EF 40-45% by echo in 2015, improved  in 2016. . Mild mitral regurgitation  . Moderate aortic stenosis by prior echocardiogram  . Peptic stricture of esophagus  . Pneumonia   "1-2 times" (11/01/2016) . Wegener's granulomatosis ([place])   sees Dr. [name] ; "went from my lungs to my kidneys" (11/01/2016) Past Surgical History: Past Surgical History: Procedure Laterality Date . CARDIAC CATHETERIZATION  03/17/2014  Procedure: CORONARY BALLOON ANGIOPLASTY;  Surgeon: Troy Sine, MD;  Location: [facility] CATH LAB;  Service: Cardiovascular;; . CATARACT EXTRACTION W/ INTRAOCULAR LENS  IMPLANT, BILATERAL  12/18/2012 . CHOLECYSTECTOMY N/A 12/21/2012  Procedure: LAPAROSCOPIC CHOLECYSTECTOMY WITH INTRAOPERATIVE CHOLANGIOGRAM;  Surgeon: Edward Jolly, MD;  Location: [facility] ORS;  Service: General;  Laterality: N/A; . COLONOSCOPY  08-16-11  per Dr. Fuller Plan, diverticulosis and polyps, repeat in 5 yrs  . COLONOSCOPY N/A 08/01/2016  Procedure: COLONOSCOPY;  Surgeon: Carol Ada, MD;  Location: [facility] ENDOSCOPY;  Service: Endoscopy;  Laterality: N/A; . ESOPHAGOGASTRODUODENOSCOPY N/A 07/31/2016  Procedure: ESOPHAGOGASTRODUODENOSCOPY (EGD);  Surgeon: Carol Ada, MD;  Location: [facility];  Service:  Endoscopy;  Laterality: N/A; . ESOPHAGOGASTRODUODENOSCOPY (EGD) WITH ESOPHAGEAL DILATION  11-29-10  per Dr. Fuller Plan  . HEMATOMA EVACUATION Right 03/19/2014  Procedure: Suture repair of femoral artery with evacuation of hematoma;  Surgeon: Mal Misty, MD;  Location: [facility];  Service: Vascular;  Laterality: Right; . INGUINAL HERNIA REPAIR Right ~ 2002 . LEFT HEART CATHETERIZATION WITH CORONARY ANGIOGRAM N/A 03/14/2014  Procedure: LEFT HEART CATHETERIZATION WITH CORONARY ANGIOGRAM;  Surgeon: Leonie Man, MD;  Location: [facility] CATH LAB: pLAD 90% calcified lesion --> after D1, LAD is Subtotally occluded. mCx 100% (L-L collaterals). pRI 60-80%. mRCA diffuse 80-95%. Marland Kitchen LUNG BIOPSY  03/2014 . PERCUTANEOUS CORONARY STENT INTERVENTION (PCI-S) N/A 03/17/2014  Procedure: PERCUTANEOUS CORONARY STENT INTERVENTION (PCI-S);  Surgeon: Troy Sine, MD;  Location: [facility] CATH LAB: Vision BMS 3.0 mm x 18 mm -- 3.2 mm); PTCA of CTO Cx-OM . PERCUTANEOUS CORONARY STENT INTERVENTION (PCI-S) N/A 03/21/2014  Procedure: PERCUTANEOUS CORONARY STENT INTERVENTION (PCI-S);  Surgeon: Burnell Blanks, MD;  Location: [facility] CATH LAB:  Rotational Atherectomy -- Mini Trek BMS 2.5 x 28 mm --> 2.75 mm) . RENAL BIOPSY   . TRANSTHORACIC ECHOCARDIOGRAM  10/2015; 10/2016  a. Now Moderate AD (AVA ~1.2 cm) EF 60-65%. GR 1 DD.;; b. Stable -- EF 60-65% with moderate LVH. Only noted grade 1 diastolic dysfunction. Moderate aortic stenosis with moderate calcification and thickening. (Estimated valve area between 1.2-1.3 cm) HPI: 81 y.o.malewith medical history significant of Wegener's granulomatosis, PUD and esophagitis, hypertension, hiatal hernia, diverticulosis, COPD, BPH, CAD status post cardiac catheterization and stent placement. Patient presented with multiple generalized and focal complaints all likely related to cardiopulmonary disease. Per MD notes pt has been complaining of dysphagia with food and liquids. He has had this problem before and wasseen by  Dr. Fuller Plan and reports having esophageal dilatation in the past. Orders for MBS per MD. Upper GI 12/242017: benign-appearing esophageal stenosis, 5cm hiatal hernia.   Subjective: Pt anxious, SOB with transfer to [place] chair for MBS Assessment / Plan / Recommendation CHL IP CLINICAL IMPRESSIONS 02/26/2017 Clinical Impression Patient presents with mild oral, moderate pharyngoesophageal and suspected primary esophageal dysphagia. Trace, silent aspiration x1 with mixed consistency (thin liquid/pill) 2/2 bolus retention in small pouch ? Zenker's diverticulum ~C6 with spillover into the airway after the swallow. No other aspiration or penetration observed. Pt with mild oral deficits with pureed and regular solids characterized by delayed oral transit and lingual pumping. Suspect oral deficits are secondary to primary esophageal dysphagia.  Swallow initiation was within normal limits at the base of tongue and occasionally valleculae. Pharyngeal stage of swallowing characterized by adequate tongue base retraction, hyolaryngeal excursion and pharyngeal constriction. Noted pooling of bolus ~C6 with sluggish passage through prominent UES, intermittent backflow into the pharynx. Subsequent dry swallows effective for clearing Periodic esophageal sweeps revealed standing column of barium in the middle esophagus, to and fro movements of contrast. No radiologist present to confirm esophageal impairments. Recommend ENT consult re: ? Zenker's, GI f/u given esophageal backflow, reduced bolus flow through UES. Recommend regular diet with thin liquids, medications whole in puree; avoid mixed consistencies, with strict reflux precautions (upright during and for 30 minutes after meals). Pt at moderate risk for aspiration given extensive esophageal hx, esophageal backflow noted during exam. Provided education and teachback to pt/daughter re: results and written/verbal education re: esophageal precautions. SLP Visit Diagnosis Dysphagia,  oropharyngeal phase (R13.12);Dysphagia, pharyngoesophageal phase (R13.14) Attention and concentration deficit following -- Frontal lobe and executive function deficit following -- Impact on safety and function Moderate aspiration risk   CHL IP TREATMENT RECOMMENDATION 02/26/2017 Treatment Recommendations No treatment recommended at this time   No flowsheet data found. CHL IP DIET RECOMMENDATION 02/26/2017 SLP Diet Recommendations Regular solids;Thin liquid;No mixed consistencies Liquid Administration via Cup;Straw Medication Administration Whole meds with puree Compensations Slow rate;Small sips/bites;Multiple dry swallows after each bite/sip;Clear throat intermittently;Follow solids with liquid Postural Changes Remain semi-upright after after feeds/meals (Comment);Seated upright at 90 degrees   CHL IP OTHER RECOMMENDATIONS 02/26/2017 Recommended Consults Consider ENT evaluation;Consider GI evaluation Oral Care Recommendations Oral care BID Other Recommendations --   CHL IP FOLLOW UP RECOMMENDATIONS 02/26/2017 Follow up Recommendations Other (comment)   CHL IP FREQUENCY AND DURATION 02/26/2017 Speech Therapy Frequency (ACUTE ONLY) min 1 x/week Treatment Duration 1 week      CHL IP ORAL PHASE 02/26/2017 Oral Phase Impaired Oral - Pudding Teaspoon -- Oral - Pudding Cup -- Oral - Honey Teaspoon -- Oral - Honey Cup -- Oral - Nectar Teaspoon -- Oral - Nectar Cup -- Oral - Nectar Straw -- Oral - Thin Teaspoon -- Oral - Thin Cup -- Oral - Thin Straw WFL Oral - Puree WFL Oral - Mech Soft -- Oral - Regular Reduced posterior propulsion;Delayed oral transit Oral - Multi-Consistency Premature spillage Oral - Pill -- Oral Phase - Comment --  CHL IP PHARYNGEAL PHASE 02/26/2017 Pharyngeal Phase Impaired Pharyngeal- Pudding Teaspoon -- Pharyngeal -- Pharyngeal- Pudding Cup -- Pharyngeal -- Pharyngeal- Honey Teaspoon -- Pharyngeal -- Pharyngeal- Honey Cup -- Pharyngeal -- Pharyngeal- Nectar Teaspoon -- Pharyngeal -- Pharyngeal- Nectar Cup  -- Pharyngeal -- Pharyngeal- Nectar Straw -- Pharyngeal -- Pharyngeal- Thin Teaspoon -- Pharyngeal -- Pharyngeal- Thin Cup -- Pharyngeal -- Pharyngeal- Thin Straw WFL Pharyngeal -- Pharyngeal- Puree WFL Pharyngeal -- Pharyngeal- Mechanical Soft -- Pharyngeal -- Pharyngeal- Regular WFL Pharyngeal -- Pharyngeal- Multi-consistency Penetration/Aspiration before swallow;Trace aspiration;Delayed swallow initiation-pyriform sinuses Pharyngeal Material enters airway, passes BELOW cords without attempt by patient to eject out (silent aspiration) Pharyngeal- Pill -- Pharyngeal -- Pharyngeal Comment --  CHL IP CERVICAL ESOPHAGEAL PHASE 02/26/2017 Cervical Esophageal Phase Impaired Pudding Teaspoon -- Pudding Cup -- Honey Teaspoon -- Honey Cup -- Nectar Teaspoon -- Nectar Cup -- Nectar Straw -- Thin Teaspoon -- Thin Cup -- Thin Straw Reduced cricopharyngeal relaxation;Prominent cricopharyngeal segment;Esophageal backflow into the pharynx;Esophageal backflow into cervical esophagus Puree Reduced cricopharyngeal relaxation;Prominent cricopharyngeal segment;Esophageal backflow into cervical esophagus;Esophageal backflow into the pharynx Mechanical Soft -- Regular Reduced cricopharyngeal relaxation;Prominent cricopharyngeal segment Multi-consistency Reduced cricopharyngeal relaxation;Prominent cricopharyngeal segment  Pill -- Cervical Esophageal Comment -- Deneise Lever, MS, CCC-SLP Speech-Language Pathologist [phone] No flowsheet data found. Aliene Altes 02/26/2017, 2:36 PM                Scheduled Meds: . arformoterol  15 mcg Nebulization BID  . aspirin EC  81 mg Oral Daily  . budesonide (PULMICORT) nebulizer solution  0.25 mg Nebulization BID  . calcitRIOL  0.25 mcg Oral Daily  . dronabinol  2.5 mg Oral BID AC  . famotidine  20 mg Oral QHS  . feeding supplement  1 Container Oral TID BM  . finasteride  5 mg Oral Daily  . heparin  5,000 Units Subcutaneous Q8H  . methylPREDNISolone (SOLU-MEDROL) injection  60 mg  Intravenous Daily  . metoprolol succinate  25 mg Oral Daily  . pantoprazole  40 mg Oral QAC breakfast  . PARoxetine  20 mg Oral Daily  . psyllium  1 packet Oral Daily  . simvastatin  20 mg Oral q1800  . sodium bicarbonate  1,300 mg Oral BID  . sodium chloride HYPERTONIC  4 mL Nebulization Daily  . tamsulosin  0.4 mg Oral Daily   Continuous Infusions: . sodium chloride 75 mL/hr at 02/26/17 2027  . ceFEPime (MAXIPIME) IV Stopped (02/27/17 1405)  . [START ON 02/28/2017] vancomycin       LOS: 6 days    Time spent: > 35 minutes  Velvet Bathe, MD [facility] Pager [phone]  If 7PM-7AM, please contact night-coverage [email or web address] Password [email or web address] 02/27/2017, 5:11 PM

## 2017-02-27 NOTE — Progress Notes (Signed)
Richard [name]  Chief Complaint:    dysphagia  Subjective: no complaints  Objective:  Vital signs in last 24 hours: Temp:  [97.7 F (36.5 C)-98 F (36.7 C)] 97.8 F (36.6 C) (07/23 0635) Pulse Rate:  [99-111] 111 (07/23 0635) Resp:  [20] 20 (07/23 0635) BP: (112-137)/(63-75) 112/63 (07/23 0635) SpO2:  [92 %-100 %] 92 % (07/23 0838) Last BM Date: 02/19/17 General:   Frail white male in NAD Heart:  Regular rate and rhythm, no lower extremity edema Pulm: tachypneic. Abdomen:  Soft, nondistended, nontender.  Normal bowel sounds, no masses felt. No hepatomegaly.    Neurologic:  sleepy  Intake/Output from previous day: 07/22 0701 - 07/23 0700 In: 791.3 [I.V.:791.3] Out: 700 [Urine:700] Intake/Output this shift: No intake/output data recorded.  Lab Results:  Recent Labs  02/25/17 0416 02/27/17 0013  WBC 7.0 5.5  HGB 8.4* 7.7*  HCT 27.6* 25.2*  PLT 99* 69*   BMET  Recent Labs  02/27/17 0013  NA 146*  K 4.4  CL 115*  CO2 25  GLUCOSE 150*  BUN 34*  CREATININE 1.19  CALCIUM 7.9*   LFT No results for input(s): PROT, ALBUMIN, AST, ALT, ALKPHOS, BILITOT, BILIDIR, IBILI in the last 72 hours. PT/INR No results for input(s): LABPROT, INR in the last 72 hours. Hepatitis Panel No results for input(s): HEPBSAG, HCVAB, HEPAIGM, HEPBIGM in the last 72 hours.  Dg Esophagus  Result Date: 02/27/2017 CLINICAL DATA:  Dysphagia EXAM: ESOPHOGRAM/BARIUM SWALLOW TECHNIQUE: Single contrast examination was performed using  and. FLUOROSCOPY TIME:  Fluoroscopy Time:  1 minutes 6 seconds Radiation Exposure Index (if provided by the fluoroscopic device): Number of Acquired Spot Images: 0 COMPARISON:  Modified barium swallow performed yesterday FINDINGS: Study is limited by patient's condition and inability to swallow large volumes, only small sips for noted. There is mild narrowing in the proximal cervical esophagus. When compared to prior modified barium  swallow, this may be related to prominent cricopharyngeus impression. No diverticulum noted. Extensive tertiary contractions throughout the remainder of the esophagus. Large hiatal hernia noted. IMPRESSION: Mild narrowing in the proximal cervical esophagus may be related to prominent cricopharyngeus impression. No diverticulum. Large hiatal hernia. Dysmotility, esophageal spasm. Electronically Signed   By: Rolm Baptise M.D.   On: 02/27/2017 08:36   Dg Chest Port 1 View  Result Date: 02/26/2017 CLINICAL DATA:  Respiratory failure EXAM: PORTABLE CHEST 1 VIEW COMPARISON:  02/11/2017 FINDINGS: Multifocal patchy opacities in the bilateral upper lobes and to a lesser extent the right lower lobe. This has progressed since the prior, particularly in the left upper lobe, favoring multifocal pneumonia. Suspected small right pleural effusion, new.  No pneumothorax. Mild cardiomegaly. IMPRESSION: Multifocal patchy opacities, upper lobe predominant, progressed from the prior and favoring multifocal pneumonia. Small right pleural effusion, new. Electronically Signed   By: Julian Hy M.D.   On: 02/26/2017 13:43   Dg Swallowing Func-speech Pathology  Result Date: 02/26/2017 Objective Swallowing Evaluation: Type of Study: MBS-Modified Barium Swallow Study Patient Details Name: Richard [name] MRN: [id number] Date of Birth: 1935-09-29 Today's Date: 02/26/2017 Time: SLP Start Time (ACUTE ONLY): 1020-SLP Stop Time (ACUTE ONLY): 1045 SLP Time Calculation (min) (ACUTE ONLY): 25 min Past Medical History: Past Medical History: Diagnosis Date . Adenomatous colon polyp  . Anemia  . Asthma  . BPH (benign prostatic hyperplasia)   sees Dr. Risa Grill, biopsy June 2015 was benign  . Bronchiectasis ([place])  . CAD (coronary artery disease)   a. [place] s/p  [place] x3 and ultimately BMS to pLAD (vision BMS 3.0 x 18), POBA to CTO of Cx-OM 03/17/14 and staged Rotablator-PCI w/ BMS (Mini Vision 2.5 x 28) to mRCA on 03/21/14 (unsuitable for CABG). .  Chronic bronchitis ([place])  . CKD (chronic kidney disease), stage IV ([place])  . COPD (chronic obstructive pulmonary disease) ([place])  . Diverticulosis  . Femoral artery pseudo-aneurysm, right ([place])   a. s/p repair. Follow up with Dr. Kellie Simmering  . Fungal infection   lungs . GERD (gastroesophageal reflux disease)  . GI bleed   a. 10/2016 - Plavix stopped. . Hiatal hernia  . History of blood transfusion 07/2016; 11/01/2016  "low blood; low blood" . History of kidney stones  . HOH (hard of hearing)   bilaterally . HTN (hypertension) 09/28/2013 . Ischemic cardiomyopathy   a. Prior EF 40-45% by echo in 2015, improved in 2016. . Mild mitral regurgitation  . Moderate aortic stenosis by prior echocardiogram  . Peptic stricture of esophagus  . Pneumonia   "1-2 times" (11/01/2016) . Wegener's granulomatosis ([place])   sees Dr. [name] ; "went from my lungs to my kidneys" (11/01/2016) Past Surgical History: Past Surgical History: Procedure Laterality Date . CARDIAC CATHETERIZATION  03/17/2014  Procedure: CORONARY BALLOON ANGIOPLASTY;  Surgeon: Troy Sine, MD;  Location: [facility] CATH LAB;  Service: Cardiovascular;; . CATARACT EXTRACTION W/ INTRAOCULAR LENS  IMPLANT, BILATERAL  12/18/2012 . CHOLECYSTECTOMY N/A 12/21/2012  Procedure: LAPAROSCOPIC CHOLECYSTECTOMY WITH INTRAOPERATIVE CHOLANGIOGRAM;  Surgeon: Edward Jolly, MD;  Location: [facility] ORS;  Service: General;  Laterality: N/A; . COLONOSCOPY  08-16-11  per Dr. Fuller Plan, diverticulosis and polyps, repeat in 5 yrs  . COLONOSCOPY N/A 08/01/2016  Procedure: COLONOSCOPY;  Surgeon: Carol Ada, MD;  Location: [facility] ENDOSCOPY;  Service: Endoscopy;  Laterality: N/A; . ESOPHAGOGASTRODUODENOSCOPY N/A 07/31/2016  Procedure: ESOPHAGOGASTRODUODENOSCOPY (EGD);  Surgeon: Carol Ada, MD;  Location: [facility];  Service: Endoscopy;  Laterality: N/A; . ESOPHAGOGASTRODUODENOSCOPY (EGD) WITH ESOPHAGEAL DILATION  11-29-10  per Dr. Fuller Plan  . HEMATOMA EVACUATION Right 03/19/2014  Procedure: Suture repair of femoral artery  with evacuation of hematoma;  Surgeon: Mal Misty, MD;  Location: [facility];  Service: Vascular;  Laterality: Right; . INGUINAL HERNIA REPAIR Right ~ 2002 . LEFT HEART CATHETERIZATION WITH CORONARY ANGIOGRAM N/A 03/14/2014  Procedure: LEFT HEART CATHETERIZATION WITH CORONARY ANGIOGRAM;  Surgeon: Leonie Man, MD;  Location: [facility] CATH LAB: pLAD 90% calcified lesion --> after D1, LAD is Subtotally occluded. mCx 100% (L-L collaterals). pRI 60-80%. mRCA diffuse 80-95%. Marland Kitchen LUNG BIOPSY  03/2014 . PERCUTANEOUS CORONARY STENT INTERVENTION (PCI-S) N/A 03/17/2014  Procedure: PERCUTANEOUS CORONARY STENT INTERVENTION (PCI-S);  Surgeon: Troy Sine, MD;  Location: [facility] CATH LAB: Vision BMS 3.0 mm x 18 mm -- 3.2 mm); PTCA of CTO Cx-OM . PERCUTANEOUS CORONARY STENT INTERVENTION (PCI-S) N/A 03/21/2014  Procedure: PERCUTANEOUS CORONARY STENT INTERVENTION (PCI-S);  Surgeon: Burnell Blanks, MD;  Location: [facility] CATH LAB:  Rotational Atherectomy -- Mini Trek BMS 2.5 x 28 mm --> 2.75 mm) . RENAL BIOPSY   . TRANSTHORACIC ECHOCARDIOGRAM  10/2015; 10/2016  a. Now Moderate AD (AVA ~1.2 cm) EF 60-65%. GR 1 DD.;; b. Stable -- EF 60-65% with moderate LVH. Only noted grade 1 diastolic dysfunction. Moderate aortic stenosis with moderate calcification and thickening. (Estimated valve area between 1.2-1.3 cm) HPI: 81 y.o.malewith medical history significant of Wegener's granulomatosis, PUD and esophagitis, hypertension, hiatal hernia, diverticulosis, COPD, BPH, CAD status post cardiac catheterization and stent placement. Patient presented with multiple generalized and focal complaints all likely  related to cardiopulmonary disease. Per MD notes pt has been complaining of dysphagia with food and liquids. He has had this problem before and wasseen by Dr. Fuller Plan and reports having esophageal dilatation in the past. Orders for MBS per MD. Upper GI 12/242017: benign-appearing esophageal stenosis, 5cm hiatal hernia.   Subjective: Pt anxious, SOB with  transfer to [place] chair for MBS Assessment / Plan / Recommendation CHL IP CLINICAL IMPRESSIONS 02/26/2017 Clinical Impression Patient presents with mild oral, moderate pharyngoesophageal and suspected primary esophageal dysphagia. Trace, silent aspiration x1 with mixed consistency (thin liquid/pill) 2/2 bolus retention in small pouch ? Zenker's diverticulum ~C6 with spillover into the airway after the swallow. No other aspiration or penetration observed. Pt with mild oral deficits with pureed and regular solids characterized by delayed oral transit and lingual pumping. Suspect oral deficits are secondary to primary esophageal dysphagia. Swallow initiation was within normal limits at the base of tongue and occasionally valleculae. Pharyngeal stage of swallowing characterized by adequate tongue base retraction, hyolaryngeal excursion and pharyngeal constriction. Noted pooling of bolus ~C6 with sluggish passage through prominent UES, intermittent backflow into the pharynx. Subsequent dry swallows effective for clearing Periodic esophageal sweeps revealed standing column of barium in the middle esophagus, to and fro movements of contrast. No radiologist present to confirm esophageal impairments. Recommend ENT consult re: ? Zenker's, GI f/u given esophageal backflow, reduced bolus flow through UES. Recommend regular diet with thin liquids, medications whole in puree; avoid mixed consistencies, with strict reflux precautions (upright during and for 30 minutes after meals). Pt at moderate risk for aspiration given extensive esophageal hx, esophageal backflow noted during exam. Provided education and teachback to pt/daughter re: results and written/verbal education re: esophageal precautions. SLP Visit Diagnosis Dysphagia, oropharyngeal phase (R13.12);Dysphagia, pharyngoesophageal phase (R13.14) Attention and concentration deficit following -- Frontal lobe and executive function deficit following -- Impact on safety and  function Moderate aspiration risk   CHL IP TREATMENT RECOMMENDATION 02/26/2017 Treatment Recommendations No treatment recommended at this time   No flowsheet data found. CHL IP DIET RECOMMENDATION 02/26/2017 SLP Diet Recommendations Regular solids;Thin liquid;No mixed consistencies Liquid Administration via Cup;Straw Medication Administration Whole meds with puree Compensations Slow rate;Small sips/bites;Multiple dry swallows after each bite/sip;Clear throat intermittently;Follow solids with liquid Postural Changes Remain semi-upright after after feeds/meals (Comment);Seated upright at 90 degrees   CHL IP OTHER RECOMMENDATIONS 02/26/2017 Recommended Consults Consider ENT evaluation;Consider GI evaluation Oral Care Recommendations Oral care BID Other Recommendations --   CHL IP FOLLOW UP RECOMMENDATIONS 02/26/2017 Follow up Recommendations Other (comment)   CHL IP FREQUENCY AND DURATION 02/26/2017 Speech Therapy Frequency (ACUTE ONLY) min 1 x/week Treatment Duration 1 week      CHL IP ORAL PHASE 02/26/2017 Oral Phase Impaired Oral - Pudding Teaspoon -- Oral - Pudding Cup -- Oral - Honey Teaspoon -- Oral - Honey Cup -- Oral - Nectar Teaspoon -- Oral - Nectar Cup -- Oral - Nectar Straw -- Oral - Thin Teaspoon -- Oral - Thin Cup -- Oral - Thin Straw WFL Oral - Puree WFL Oral - Mech Soft -- Oral - Regular Reduced posterior propulsion;Delayed oral transit Oral - Multi-Consistency Premature spillage Oral - Pill -- Oral Phase - Comment --  CHL IP PHARYNGEAL PHASE 02/26/2017 Pharyngeal Phase Impaired Pharyngeal- Pudding Teaspoon -- Pharyngeal -- Pharyngeal- Pudding Cup -- Pharyngeal -- Pharyngeal- Honey Teaspoon -- Pharyngeal -- Pharyngeal- Honey Cup -- Pharyngeal -- Pharyngeal- Nectar Teaspoon -- Pharyngeal -- Pharyngeal- Nectar Cup -- Pharyngeal -- Pharyngeal- Nectar Straw -- Pharyngeal -- Pharyngeal- Thin  Teaspoon -- Pharyngeal -- Pharyngeal- Thin Cup -- Pharyngeal -- Pharyngeal- Thin Straw WFL Pharyngeal -- Pharyngeal- Puree  WFL Pharyngeal -- Pharyngeal- Mechanical Soft -- Pharyngeal -- Pharyngeal- Regular WFL Pharyngeal -- Pharyngeal- Multi-consistency Penetration/Aspiration before swallow;Trace aspiration;Delayed swallow initiation-pyriform sinuses Pharyngeal Material enters airway, passes BELOW cords without attempt by patient to eject out (silent aspiration) Pharyngeal- Pill -- Pharyngeal -- Pharyngeal Comment --  CHL IP CERVICAL ESOPHAGEAL PHASE 02/26/2017 Cervical Esophageal Phase Impaired Pudding Teaspoon -- Pudding Cup -- Honey Teaspoon -- Honey Cup -- Nectar Teaspoon -- Nectar Cup -- Nectar Straw -- Thin Teaspoon -- Thin Cup -- Thin Straw Reduced cricopharyngeal relaxation;Prominent cricopharyngeal segment;Esophageal backflow into the pharynx;Esophageal backflow into cervical esophagus Puree Reduced cricopharyngeal relaxation;Prominent cricopharyngeal segment;Esophageal backflow into cervical esophagus;Esophageal backflow into the pharynx Mechanical Soft -- Regular Reduced cricopharyngeal relaxation;Prominent cricopharyngeal segment Multi-consistency Reduced cricopharyngeal relaxation;Prominent cricopharyngeal segment Pill -- Cervical Esophageal Comment -- Deneise Lever, MS, CCC-SLP Speech-Language Pathologist [phone] No flowsheet data found. Aliene Altes 02/26/2017, 2:36 PM               ASSESSMENT / PLAN:   1. 81 yo male with dysphagia and hx of distal esophageal stricture recurring dilation in past. Esophogram this admission shows dysmotility and mild narrowing of proximal esophagus.  MBSS suggests pharyngoesophageal dysphagia, trace silent aspiration x 1 with mixed consistency, a prominent EUS and Zenkers diverticulum. Sweeps of esophagus revealed standing barium in middle of esophagus but follow esophagram negative for distal stricture (only showed dysmotility and mild narrowing of proximal esophagus). Additionally no Zenkers seen on esophogram.  ENT evaluated, performed flexible fiberoptic laryngoscopy-essentially  normal.    -dysphagia likely multifactorial. He may benefit for EGD with dilation of UES and evaluation of any distal strictures but he isn't a candidate until pulmonary status improves.    2. Acute exacerbation of obstructive bronchiectasis followed by Pulmonary. Apparently treatment has been a challenge due to inability to adherence to complex regimen.     Active Problems:   Wegener's granulomatosis ([place])   Obstructive bronchiectasis (HCC)   Chronic kidney disease, stage IV (severe) (HCC)   Protein-calorie malnutrition, severe (HCC)   Hypotension   Acute respiratory failure with hypoxia (HCC)   Chest pressure   FTT (failure to thrive) in adult   Pressure injury of skin   PAF (paroxysmal atrial fibrillation) ([place])     LOS: 6 days   Tye Savoy ,NP 02/27/2017, 10:58 AM  Pager number [phone]

## 2017-02-27 NOTE — Progress Notes (Signed)
OT Cancellation Note  Patient Details Name: BLAIKE VICKERS MRN: [id number] DOB: 1935-12-09   Cancelled Treatment:    Reason Eval/Treat Not Completed: Fatigue/lethargy limiting ability to participate. Pt fatigued from multiple tests earlier today. Will follow.  Malka So 02/27/2017, 11:37 AM  [phone]

## 2017-02-27 NOTE — Consult Note (Signed)
Reason for Consult: Trouble swallowing Referring Physician: Velvet Bathe, MD  Richard [name] is an 81 y.o. male.  HPI: History of long-standing reflux disease, has had esophageal dilatation multiple times in the past. He doesn't smoke or drink and does not consume caffeine. He suffered a back injury about 2 months ago. Prior to that he was very active. Since then he has lost his appetite, and spent several weeks lying around taking pain medicine until a diagnosis was made and treatment plan was put into place. His 2 daughters have provided all of the history and all the information. He underwent a modified barium swallow couple of days ago which revealed that he may have what appears to be a Zenker's diverticulum. Prior to my arrival I had him undergo a barium swallow to evaluate the anatomy. I have reviewed that. The no evidence of Zenker's diverticulum but there is significant cricopharyngeal hypertrophy.  Past Medical History:  Diagnosis Date  . Adenomatous colon polyp   . Anemia   . Asthma   . BPH (benign prostatic hyperplasia)    sees Dr. Risa Grill, biopsy June 2015 was benign   . Bronchiectasis ([place])   . CAD (coronary artery disease)    a. [place] s/p [place] x3 and ultimately BMS to pLAD (vision BMS 3.0 x 18), POBA to CTO of Cx-OM 03/17/14 and staged Rotablator-PCI w/ BMS (Mini Vision 2.5 x 28) to mRCA on 03/21/14 (unsuitable for CABG).  . Chronic bronchitis ([place])   . CKD (chronic kidney disease), stage IV ([place])   . COPD (chronic obstructive pulmonary disease) ([place])   . Diverticulosis   . Femoral artery pseudo-aneurysm, right ([place])    a. s/p repair. Follow up with Dr. Kellie Simmering   . Fungal infection    lungs  . GERD (gastroesophageal reflux disease)   . GI bleed    a. 10/2016 - Plavix stopped.  . Hiatal hernia   . History of blood transfusion 07/2016; 11/01/2016   "low blood; low blood"  . History of kidney stones   . HOH (hard of hearing)    bilaterally  . HTN (hypertension) 09/28/2013  .  Ischemic cardiomyopathy    a. Prior EF 40-45% by echo in 2015, improved in 2016.  . Mild mitral regurgitation   . Moderate aortic stenosis by prior echocardiogram   . Peptic stricture of esophagus   . Pneumonia    "1-2 times" (11/01/2016)  . Wegener's granulomatosis ([place])    sees Dr. [name] ; "went from my lungs to my kidneys" (11/01/2016)    Past Surgical History:  Procedure Laterality Date  . CARDIAC CATHETERIZATION  03/17/2014   Procedure: CORONARY BALLOON ANGIOPLASTY;  Surgeon: Troy Sine, MD;  Location: [facility] CATH LAB;  Service: Cardiovascular;;  . CATARACT EXTRACTION W/ INTRAOCULAR LENS  IMPLANT, BILATERAL  12/18/2012  . CHOLECYSTECTOMY N/A 12/21/2012   Procedure: LAPAROSCOPIC CHOLECYSTECTOMY WITH INTRAOPERATIVE CHOLANGIOGRAM;  Surgeon: Edward Jolly, MD;  Location: [facility] ORS;  Service: General;  Laterality: N/A;  . COLONOSCOPY  08-16-11   per Dr. Fuller Plan, diverticulosis and polyps, repeat in 5 yrs   . COLONOSCOPY N/A 08/01/2016   Procedure: COLONOSCOPY;  Surgeon: Carol Ada, MD;  Location: [facility] ENDOSCOPY;  Service: Endoscopy;  Laterality: N/A;  . ESOPHAGOGASTRODUODENOSCOPY N/A 07/31/2016   Procedure: ESOPHAGOGASTRODUODENOSCOPY (EGD);  Surgeon: Carol Ada, MD;  Location: [facility];  Service: Endoscopy;  Laterality: N/A;  . ESOPHAGOGASTRODUODENOSCOPY (EGD) WITH ESOPHAGEAL DILATION  11-29-10   per Dr. Fuller Plan   . HEMATOMA EVACUATION Right 03/19/2014  Procedure: Suture repair of femoral artery with evacuation of hematoma;  Surgeon: Mal Misty, MD;  Location: [place];  Service: Vascular;  Laterality: Right;  . INGUINAL HERNIA REPAIR Right ~ 2002  . LEFT HEART CATHETERIZATION WITH CORONARY ANGIOGRAM N/A 03/14/2014   Procedure: LEFT HEART CATHETERIZATION WITH CORONARY ANGIOGRAM;  Surgeon: Leonie Man, MD;  Location: [facility] CATH LAB: pLAD 90% calcified lesion --> after D1, LAD is Subtotally occluded. mCx 100% (L-L collaterals). pRI 60-80%. mRCA diffuse 80-95%.  Marland Kitchen LUNG BIOPSY  03/2014  .  PERCUTANEOUS CORONARY STENT INTERVENTION (PCI-S) N/A 03/17/2014   Procedure: PERCUTANEOUS CORONARY STENT INTERVENTION (PCI-S);  Surgeon: Troy Sine, MD;  Location: [facility] CATH LAB: Vision BMS 3.0 mm x 18 mm -- 3.2 mm); PTCA of CTO Cx-OM  . PERCUTANEOUS CORONARY STENT INTERVENTION (PCI-S) N/A 03/21/2014   Procedure: PERCUTANEOUS CORONARY STENT INTERVENTION (PCI-S);  Surgeon: Burnell Blanks, MD;  Location: [facility] CATH LAB:  Rotational Atherectomy -- Mini Trek BMS 2.5 x 28 mm --> 2.75 mm)  . RENAL BIOPSY    . TRANSTHORACIC ECHOCARDIOGRAM  10/2015; 10/2016   a. Now Moderate AD (AVA ~1.2 cm) EF 60-65%. GR 1 DD.;; b. Stable -- EF 60-65% with moderate LVH. Only noted grade 1 diastolic dysfunction. Moderate aortic stenosis with moderate calcification and thickening. (Estimated valve area between 1.2-1.3 cm)    Family History  Problem Relation Age of Onset  . Asthma Father   . Coronary artery disease Mother   . Coronary artery disease Brother   . Coronary artery disease Brother   . Colon cancer Neg Hx   . Stomach cancer Neg Hx     Social History:  reports that he has never smoked. He has never used smokeless tobacco. He reports that he does not drink alcohol or use drugs.  Allergies: No Known Allergies  Medications: Reviewed  Results for orders placed or performed during the hospital encounter of 02/06/2017 (from the past 48 hour(s))  Culture, respiratory (NON-Expectorated)     Status: None (Preliminary result)   Collection Time: 02/25/17  4:03 PM  Result Value Ref Range   Specimen Description INDUCED SPUTUM    Special Requests NONE    Gram Stain      ABUNDANT WBC PRESENT,BOTH PMN AND MONONUCLEAR FEW SQUAMOUS EPITHELIAL CELLS PRESENT MODERATE GRAM NEGATIVE RODS MODERATE GRAM POSITIVE COCCI IN CHAINS IN CLUSTERS FEW GRAM POSITIVE RODS RARE BUDDING YEAST SEEN    Culture TOO YOUNG TO READ    Report Status PENDING   Basic metabolic panel     Status: Abnormal   Collection Time: 02/27/17  12:13 AM  Result Value Ref Range   Sodium 146 (H) 135 - 145 mmol/L   Potassium 4.4 3.5 - 5.1 mmol/L   Chloride 115 (H) 101 - 111 mmol/L   CO2 25 22 - 32 mmol/L   Glucose, Bld 150 (H) 65 - 99 mg/dL   BUN 34 (H) 6 - 20 mg/dL   Creatinine, Ser 1.19 0.61 - 1.24 mg/dL   Calcium 7.9 (L) 8.9 - 10.3 mg/dL   GFR calc non Af Amer 57 (L) >60 mL/min   GFR calc Af Amer >60 >60 mL/min    Comment: (NOTE) The eGFR has been calculated using the CKD EPI equation. This calculation has not been validated in all clinical situations. eGFR's persistently <60 mL/min signify possible Chronic Kidney Disease.    Anion gap 6 5 - 15  CBC     Status: Abnormal   Collection Time: 02/27/17 12:13  AM  Result Value Ref Range   WBC 5.5 4.0 - 10.5 K/uL   RBC 2.24 (L) 4.22 - 5.81 MIL/uL   Hemoglobin 7.7 (L) 13.0 - 17.0 g/dL   HCT 25.2 (L) 39.0 - 52.0 %   MCV 112.5 (H) 78.0 - 100.0 fL   [name] 34.4 (H) 26.0 - 34.0 pg   MCHC 30.6 30.0 - 36.0 g/dL   RDW 17.4 (H) 11.5 - 15.5 %   Platelets 69 (L) 150 - 400 K/uL    Comment: CONSISTENT WITH PREVIOUS RESULT   *Note: Due to a large number of results and/or encounters for the requested time period, some results have not been displayed. A complete set of results can be found in Results Review.    Dg Esophagus  Result Date: 02/27/2017 CLINICAL DATA:  Dysphagia EXAM: ESOPHOGRAM/BARIUM SWALLOW TECHNIQUE: Single contrast examination was performed using  and. FLUOROSCOPY TIME:  Fluoroscopy Time:  1 minutes 6 seconds Radiation Exposure Index (if provided by the fluoroscopic device): Number of Acquired Spot Images: 0 COMPARISON:  Modified barium swallow performed yesterday FINDINGS: Study is limited by patient's condition and inability to swallow large volumes, only small sips for noted. There is mild narrowing in the proximal cervical esophagus. When compared to prior modified barium swallow, this may be related to prominent cricopharyngeus impression. No diverticulum noted. Extensive  tertiary contractions throughout the remainder of the esophagus. Large hiatal hernia noted. IMPRESSION: Mild narrowing in the proximal cervical esophagus may be related to prominent cricopharyngeus impression. No diverticulum. Large hiatal hernia. Dysmotility, esophageal spasm. Electronically Signed   By: Rolm Baptise M.D.   On: 02/27/2017 08:36   Dg Chest Port 1 View  Result Date: 02/26/2017 CLINICAL DATA:  Respiratory failure EXAM: PORTABLE CHEST 1 VIEW COMPARISON:  02/07/2017 FINDINGS: Multifocal patchy opacities in the bilateral upper lobes and to a lesser extent the right lower lobe. This has progressed since the prior, particularly in the left upper lobe, favoring multifocal pneumonia. Suspected small right pleural effusion, new.  No pneumothorax. Mild cardiomegaly. IMPRESSION: Multifocal patchy opacities, upper lobe predominant, progressed from the prior and favoring multifocal pneumonia. Small right pleural effusion, new. Electronically Signed   By: Julian Hy M.D.   On: 02/26/2017 13:43   Dg Swallowing Func-speech Pathology  Result Date: 02/26/2017 Objective Swallowing Evaluation: Type of Study: MBS-Modified Barium Swallow Study Patient Details Name: Richard [name] MRN: [id number] Date of Birth: Mar 09, 1936 Today's Date: 02/26/2017 Time: SLP Start Time (ACUTE ONLY): 1020-SLP Stop Time (ACUTE ONLY): 1045 SLP Time Calculation (min) (ACUTE ONLY): 25 min Past Medical History: Past Medical History: Diagnosis Date . Adenomatous colon polyp  . Anemia  . Asthma  . BPH (benign prostatic hyperplasia)   sees Dr. Risa Grill, biopsy June 2015 was benign  . Bronchiectasis ([place])  . CAD (coronary artery disease)   a. [place] s/p [place] x3 and ultimately BMS to pLAD (vision BMS 3.0 x 18), POBA to CTO of Cx-OM 03/17/14 and staged Rotablator-PCI w/ BMS (Mini Vision 2.5 x 28) to mRCA on 03/21/14 (unsuitable for CABG). . Chronic bronchitis ([place])  . CKD (chronic kidney disease), stage IV ([place])  . COPD (chronic obstructive  pulmonary disease) ([place])  . Diverticulosis  . Femoral artery pseudo-aneurysm, right ([place])   a. s/p repair. Follow up with Dr. Kellie Simmering  . Fungal infection   lungs . GERD (gastroesophageal reflux disease)  . GI bleed   a. 10/2016 - Plavix stopped. . Hiatal hernia  . History of blood transfusion 07/2016; 11/01/2016  "low blood;  low blood" . History of kidney stones  . HOH (hard of hearing)   bilaterally . HTN (hypertension) 09/28/2013 . Ischemic cardiomyopathy   a. Prior EF 40-45% by echo in 2015, improved in 2016. . Mild mitral regurgitation  . Moderate aortic stenosis by prior echocardiogram  . Peptic stricture of esophagus  . Pneumonia   "1-2 times" (11/01/2016) . Wegener's granulomatosis ([place])   sees Dr. [name] ; "went from my lungs to my kidneys" (11/01/2016) Past Surgical History: Past Surgical History: Procedure Laterality Date . CARDIAC CATHETERIZATION  03/17/2014  Procedure: CORONARY BALLOON ANGIOPLASTY;  Surgeon: Troy Sine, MD;  Location: [facility] CATH LAB;  Service: Cardiovascular;; . CATARACT EXTRACTION W/ INTRAOCULAR LENS  IMPLANT, BILATERAL  12/18/2012 . CHOLECYSTECTOMY N/A 12/21/2012  Procedure: LAPAROSCOPIC CHOLECYSTECTOMY WITH INTRAOPERATIVE CHOLANGIOGRAM;  Surgeon: Edward Jolly, MD;  [facility]S;  Service: General;  Laterality: N/A; . COLONOSCOPY  08-16-11  per Dr. Fuller Plan, diverticulosis and polyps, repeat in 5 yrs  . COLONOSCOPY N/A 08/01/2016  Procedure: COLONOSCOPY;  Surgeon: Carol Ada, MD;  [facility] ENDOS[facility];  Service: Endoscopy;  Laterality: N/A; . ESOPHAGOGASTRODUODENOSCOPY N/A 07/31/2016  Procedure: ESOPHAGOGASTRODUODENOSCOPY (EGD);  Surgeon: Carol Ada, MD;  Location: [facility];  Service: Endoscopy;  Laterality: N/A; . ESOPHAGOGASTRODUODENOSCOPY (EGD) WITH ESOPHAGEAL DILATION  11-29-10  per Dr. Fuller Plan  . HEMATOMA EVACUATION Right 03/19/2014  Procedure: Suture repair of femoral artery with evacuation of hematoma;  Surgeon: Mal Misty, MD;  Location: [place];  Service: Vascular;   Laterality: Right; . INGUINAL HERNIA REPAIR Right ~ 2002 . LEFT HEART CATHETERIZATION WITH CORONARY ANGIOGRAM N/A 03/14/2014  Procedure: LEFT HEART CATHETERIZATION WITH CORONARY ANGIOGRAM;  Surgeon: Leonie Man, MD;  Location: [facility] CATH LAB: pLAD 90% calcified lesion --> after D1, LAD is Subtotally occluded. mCx 100% (L-L collaterals). pRI 60-80%. mRCA diffuse 80-95%. Marland Kitchen LUNG BIOPSY  03/2014 . PERCUTANEOUS CORONARY STENT INTERVENTION (PCI-S) N/A 03/17/2014  Procedure: PERCUTANEOUS CORONARY STENT INTERVENTION (PCI-S);  Surgeon: Troy Sine, MD;  Location: [facility] CATH LAB: Vision BMS 3.0 mm x 18 mm -- 3.2 mm); PTCA of CTO Cx-OM . PERCUTANEOUS CORONARY STENT INTERVENTION (PCI-S) N/A 03/21/2014  Procedure: PERCUTANEOUS CORONARY STENT INTERVENTION (PCI-S);  Surgeon: Burnell Blanks, MD;  Location: [facility] CATH LAB:  Rotational Atherectomy -- Mini Trek BMS 2.5 x 28 mm --> 2.75 mm) . RENAL BIOPSY   . TRANSTHORACIC ECHOCARDIOGRAM  10/2015; 10/2016  a. Now Moderate AD (AVA ~1.2 cm) EF 60-65%. GR 1 DD.;; b. Stable -- EF 60-65% with moderate LVH. Only noted grade 1 diastolic dysfunction. Moderate aortic stenosis with moderate calcification and thickening. (Estimated valve area between 1.2-1.3 cm) HPI: 81 y.o.malewith medical history significant of Wegener's granulomatosis, PUD and esophagitis, hypertension, hiatal hernia, diverticulosis, COPD, BPH, CAD status post cardiac catheterization and stent placement. Patient presented with multiple generalized and focal complaints all likely related to cardiopulmonary disease. Per MD notes pt has been complaining of dysphagia with food and liquids. He has had this problem before and wasseen by Dr. Fuller Plan and reports having esophageal dilatation in the past. Orders for MBS per MD. Upper GI 12/242017: benign-appearing esophageal stenosis, 5cm hiatal hernia.   Subjective: Pt anxious, SOB with transfer to [place] chair for MBS Assessment / Plan / Recommendation CHL IP CLINICAL IMPRESSIONS  02/26/2017 Clinical Impression Patient presents with mild oral, moderate pharyngoesophageal and suspected primary esophageal dysphagia. Trace, silent aspiration x1 with mixed consistency (thin liquid/pill) 2/2 bolus retention in small pouch ? Zenker's diverticulum ~C6 with spillover into the airway after the  swallow. No other aspiration or penetration observed. Pt with mild oral deficits with pureed and regular solids characterized by delayed oral transit and lingual pumping. Suspect oral deficits are secondary to primary esophageal dysphagia. Swallow initiation was within normal limits at the base of tongue and occasionally valleculae. Pharyngeal stage of swallowing characterized by adequate tongue base retraction, hyolaryngeal excursion and pharyngeal constriction. Noted pooling of bolus ~C6 with sluggish passage through prominent UES, intermittent backflow into the pharynx. Subsequent dry swallows effective for clearing Periodic esophageal sweeps revealed standing column of barium in the middle esophagus, to and fro movements of contrast. No radiologist present to confirm esophageal impairments. Recommend ENT consult re: ? Zenker's, GI f/u given esophageal backflow, reduced bolus flow through UES. Recommend regular diet with thin liquids, medications whole in puree; avoid mixed consistencies, with strict reflux precautions (upright during and for 30 minutes after meals). Pt at moderate risk for aspiration given extensive esophageal hx, esophageal backflow noted during exam. Provided education and teachback to pt/daughter re: results and written/verbal education re: esophageal precautions. SLP Visit Diagnosis Dysphagia, oropharyngeal phase (R13.12);Dysphagia, pharyngoesophageal phase (R13.14) Attention and concentration deficit following -- Frontal lobe and executive function deficit following -- Impact on safety and function Moderate aspiration risk   CHL IP TREATMENT RECOMMENDATION 02/26/2017 Treatment  Recommendations No treatment recommended at this time   No flowsheet data found. CHL IP DIET RECOMMENDATION 02/26/2017 SLP Diet Recommendations Regular solids;Thin liquid;No mixed consistencies Liquid Administration via Cup;Straw Medication Administration Whole meds with puree Compensations Slow rate;Small sips/bites;Multiple dry swallows after each bite/sip;Clear throat intermittently;Follow solids with liquid Postural Changes Remain semi-upright after after feeds/meals (Comment);Seated upright at 90 degrees   CHL IP OTHER RECOMMENDATIONS 02/26/2017 Recommended Consults Consider ENT evaluation;Consider GI evaluation Oral Care Recommendations Oral care BID Other Recommendations --   CHL IP FOLLOW UP RECOMMENDATIONS 02/26/2017 Follow up Recommendations Other (comment)   CHL IP FREQUENCY AND DURATION 02/26/2017 Speech Therapy Frequency (ACUTE ONLY) min 1 x/week Treatment Duration 1 week      CHL IP ORAL PHASE 02/26/2017 Oral Phase Impaired Oral - Pudding Teaspoon -- Oral - Pudding Cup -- Oral - Honey Teaspoon -- Oral - Honey Cup -- Oral - Nectar Teaspoon -- Oral - Nectar Cup -- Oral - Nectar Straw -- Oral - Thin Teaspoon -- Oral - Thin Cup -- Oral - Thin Straw WFL Oral - Puree WFL Oral - Mech Soft -- Oral - Regular Reduced posterior propulsion;Delayed oral transit Oral - Multi-Consistency Premature spillage Oral - Pill -- Oral Phase - Comment --  CHL IP PHARYNGEAL PHASE 02/26/2017 Pharyngeal Phase Impaired Pharyngeal- Pudding Teaspoon -- Pharyngeal -- Pharyngeal- Pudding Cup -- Pharyngeal -- Pharyngeal- Honey Teaspoon -- Pharyngeal -- Pharyngeal- Honey Cup -- Pharyngeal -- Pharyngeal- Nectar Teaspoon -- Pharyngeal -- Pharyngeal- Nectar Cup -- Pharyngeal -- Pharyngeal- Nectar Straw -- Pharyngeal -- Pharyngeal- Thin Teaspoon -- Pharyngeal -- Pharyngeal- Thin Cup -- Pharyngeal -- Pharyngeal- Thin Straw WFL Pharyngeal -- Pharyngeal- Puree WFL Pharyngeal -- Pharyngeal- Mechanical Soft -- Pharyngeal -- Pharyngeal- Regular WFL  Pharyngeal -- Pharyngeal- Multi-consistency Penetration/Aspiration before swallow;Trace aspiration;Delayed swallow initiation-pyriform sinuses Pharyngeal Material enters airway, passes BELOW cords without attempt by patient to eject out (silent aspiration) Pharyngeal- Pill -- Pharyngeal -- Pharyngeal Comment --  CHL IP CERVICAL ESOPHAGEAL PHASE 02/26/2017 Cervical Esophageal Phase Impaired Pudding Teaspoon -- Pudding Cup -- Honey Teaspoon -- Honey Cup -- Nectar Teaspoon -- Nectar Cup -- Nectar Straw -- Thin Teaspoon -- Thin Cup -- Thin Straw Reduced cricopharyngeal relaxation;Prominent cricopharyngeal segment;Esophageal backflow into the  pharynx;Esophageal backflow into cervical esophagus Puree Reduced cricopharyngeal relaxation;Prominent cricopharyngeal segment;Esophageal backflow into cervical esophagus;Esophageal backflow into the pharynx Mechanical Soft -- Regular Reduced cricopharyngeal relaxation;Prominent cricopharyngeal segment Multi-consistency Reduced cricopharyngeal relaxation;Prominent cricopharyngeal segment Pill -- Cervical Esophageal Comment -- Deneise Lever, MS, CCC-SLP Speech-Language Pathologist [phone] No flowsheet data found. Aliene Altes 02/26/2017, 2:36 PM               [id number] except as listed in admit H&P  Blood pressure 112/63, pulse (!) 111, temperature 97.8 F (36.6 C), resp. rate 20, height 5' 7" (1.702 m), weight 40.8 kg (89 lb 14.4 oz), SpO2 92 %.  PHYSICAL EXAM: Overall appearance:  Elderly gentleman, falls asleep easily, arouses relatively easily and has a weak voice and a weak cough. Head:  Normocephalic, atraumatic. Ears: External ears appear normal. Nose: External nose is healthy in appearance. Internal nasal exam free of any lesions or obstruction. Oral Cavity/Pharynx:  There are no mucosal lesions or masses identified. Mucous membranes are dry and there is some barium residue throughout the oral cavity and oropharynx. Larynx/Hypopharynx: Normal vocal cord  mobility. No evidence of mucosal masses. Again dry mucosa with barium residue but no pooling of secretions. Neuro:  No identifiable neurologic deficits. Neck: No palpable neck masses.  Studies Reviewed: Barium swallow, negative for Zenker's diverticulum.  Procedures: Flexible fiberoptic laryngoscopy. Topical Xylocaine/Afrin was applied to the nasal cavities and spray formed. The fiberoptic endoscope was passed through the left nasal cavity, through the nasopharynx and into the oropharynx to visualize the larynx and hypopharynx. There are no mucosal masses identified. The cords move well. Airway is patent. There is no pooling of secretions. This very dry mucosa throughout and barium residue staining the pharyngeal mucosa.   Assessment/Plan: Mild aspiration, chronic dysphagia secondary to cricopharyngeal hypertrophy. Treatment for this is aggressive reflux treatment. He is on medication. His other big problem is his loss of overall strength which makes it harder for him to clear secretions. Recommend continued physical rehabilitation and swallowing therapy. No need for surgical intervention. We discussed that as a last resort he may require nothing by mouth status and a feeding tube.  ,  02/27/2017, 9:36 AM

## 2017-02-27 NOTE — Progress Notes (Signed)
PULMONARY / CRITICAL CARE MEDICINE   Name: Richard [name] MRN: [id number] DOB: 09/24/1935    ADMISSION DATE:  02/13/2017 CONSULTATION DATE:  02/25/17  REFERRING MD:  Wendee Beavers   CHIEF COMPLAINT:  Cough/sob/ dysphagia   HISTORY OF PRESENT ILLNESS:     61  yowm never smoker with dx of Longstanding bronchiectasis then dx with WG 03/2010 seen in office 02/20/17 on an extremely complex medical regimen but not using flutter valve and unalble to tol vest with FTT/ worsening doe x 6 weeks and persistent purulent sputum so rec empirical levaquin/ flutter valve and increase pred to 20 mg per day until improvement but came to ER 7/17  "no better" with new transient cp and Aflutter and PCCM consulted am 7/21 unable to cough up secretions well but when does are quite yellow, not bloody, with new report of dysphagia x 2 weeks not brought up as a concern in office on 02/13/15 assoc with profound anorexia and concern for dehydration but no evidence for this on lab 7/16.  PCCM service asked for imput into airways management am 7/21      SUBJECTIVE:   Still feels weak   VITAL SIGNS: BP 112/63 (BP Location: Right Arm)   Pulse (!) 111   Temp 97.8 F (36.6 C)   Resp 20   Ht 5\' 7"  (1.702 m)   Wt 89 lb 14.4 oz (40.8 kg)   SpO2 92%   BMI 14.08 kg/m   FIO2 =  4 liters     INTAKE / OUTPUT:  Intake/Output Summary (Last 24 hours) at 02/27/17 1404 Last data filed at 02/27/17 1213  Gross per 24 hour  Intake           791.25 ml  Output              900 ml  Net          -108.75 ml     PHYSICAL EXAMINATION Physical Exam  Constitutional: He is oriented to person, place, and time. He appears cachectic. He is cooperative. He has a sickly appearance.  HENT:  Head: Normocephalic and atraumatic.  Eyes: Conjunctivae, EOM and lids are normal. No scleral icterus.  Cardiovascular: Normal rate and regular rhythm.   Pulmonary/Chest: No tachypnea. No respiratory distress. He has wheezes. He has rhonchi. He has rales.   Abdominal: Normal appearance and bowel sounds are normal. There is no tenderness.  Neurological: He is alert and oriented to person, place, and time. GCS eye subscore is 4. GCS verbal subscore is 5. GCS motor subscore is 6.  Skin: Skin is warm and dry. He is not diaphoretic.  Psychiatric: His speech is delayed. He is withdrawn. He exhibits a depressed mood.    LABS:  BMET  Recent Labs Lab 02/22/17 0348 02/23/17 0243 02/27/17 0013  NA 143 146* 146*  K 4.5 5.1 4.4  CL 111 113* 115*  CO2 24 25 25   BUN 57* 59* 34*  CREATININE 1.76* 1.68* 1.19  GLUCOSE 117* 163* 150*    Electrolytes  Recent Labs Lab 02/22/17 0348 02/23/17 0243 02/27/17 0013  CALCIUM 8.0* 7.8* 7.9*    CBC  Recent Labs Lab 02/24/17 0430 02/25/17 0416 02/27/17 0013  WBC 5.1 7.0 5.5  HGB 8.7* 8.4* 7.7*  HCT 28.6* 27.6* 25.2*  PLT 114* 99* 69*    Coag's No results for input(s): APTT, INR in the last 168 hours.  Sepsis Markers No results for input(s): LATICACIDVEN, PROCALCITON, O2SATVEN in the last 168 hours.  ABG  Recent Labs Lab 03/06/2017 1316  PHART 7.446  PCO2ART 30.5*  PO2ART 85.0    Liver Enzymes  Recent Labs Lab 02/22/2017 1038  AST 15  ALT 12*  ALKPHOS 126  BILITOT 1.1  ALBUMIN 2.4*    Cardiac Enzymes  Recent Labs Lab 02/28/2017 1403 02/11/2017 1646 02/06/2017 1930  TROPONINI <0.03 <0.03 <0.03    Glucose No results for input(s): GLUCAP in the last 168 hours.    STUDIES:   ANCA titers 7/22>>>  CULTURES: Sputum  7/21 > mod GNR >>>   ANTIBIOTICS: Levaquin 7/16 (aeBronchiectasis) >>> changed to vanc and cefepime 7/23    ASSESSMENT / PLAN:  Acute hypoxic respiratory failure in setting of Acute exac obst bronchiectasis suspect complicated by chronic & worsening dysphagia, GI dysmotility and FTT.  - PFT's 11/22/2012 FEV1  1.52 (62%) arnd 46 and no change p B2 DLCO 89%  - can't tolerate chest vest d/t Cervical fracture - sputum still pending - abx changed today  d/t lack of clinical improvement  Plan Wean Oxygen as needed Cont pulm hygiene measures as tolerated  Will add hypertonic saline  He's a full code. Palliative care team has been consulted. I think intubation would be terrible for him & recommend DNR status -->he seems agreeable as does his family to DNR but we need to have a consensus on this  Cont abx per primary service  Cont BDs   Severe dysphagia -See GI's note re: logistics of PEG. Agree poor candidate Plan Cont asp precautions   CRI/ WG  W/ hyperchloremia and hypernatremia Plan Per primary service Would consider stopping cytoxan given active infection   CAF  Plan Per primary team   I've challenged the patient and two of his daughters in regards to advanced directives. I think that there is a chance we can help him to feel better BUT I also think that he has limited reserves and that he is nearing the end-of-his life. I DO NOT think he is a good candidate for life support and when this happens again the best case scenario we can hope for would be to return him to his current poor state of health. If we get him thru this we should absolutely get hospice involved.    I spent 60 minutes discussing these issues with the patient and his daughters at the bedside.   Erick Colace ACNP-BC [place] Pager # [phone] OR # [phone] if no answer

## 2017-02-27 NOTE — Care Management Important Message (Signed)
Important Message  Patient Details  Name: Richard [name] MRN: [id number] Date of Birth: 10-04-1935   Medicare Important Message Given:  Yes    Nathen May 02/27/2017, 9:44 AM

## 2017-02-27 NOTE — Progress Notes (Signed)
PT Cancellation Note  Patient Details Name: Richard [name] MRN: [id number] DOB: Feb 10, 1936   Cancelled Treatment:    Reason Eval/Treat Not Completed: Patient declined, no reason specified.  Pt too fatigued from this am's testing.  Will try back in the afternoon as able. 02/27/2017  Richard [name], [place] [phone]  (pager)   Richard [name] 02/27/2017, 11:26 AM

## 2017-02-27 NOTE — Progress Notes (Signed)
ANTIBIOTIC CONSULT NOTE - INITIAL  Pharmacy Consult for Vancomycin and Cefepime Indication: pneumonia  No Known Allergies  Patient Measurements: Height: 5\' 7"  (170.2 cm) Weight: 89 lb 14.4 oz (40.8 kg) IBW/kg (Calculated) : 66.1  Vital Signs: Temp: 97.8 F (36.6 C) (07/23 0635) BP: 112/63 (07/23 0635) Pulse Rate: 111 (07/23 0635)  Labs:  Recent Labs  02/25/17 0416 02/27/17 0013  WBC 7.0 5.5  HGB 8.4* 7.7*  PLT 99* 69*  CREATININE  --  1.19   Estimated Creatinine Clearance: 29.5 mL/min (by C-G formula based on SCr of 1.19 mg/dL). No results for input(s): VANCOTROUGH, VANCOPEAK, VANCORANDOM, GENTTROUGH, GENTPEAK, GENTRANDOM, TOBRATROUGH, TOBRAPEAK, TOBRARND, AMIKACINPEAK, AMIKACINTROU, AMIKACIN in the last 72 hours.   Microbiology: Recent Results (from the past 720 hour(s))  Culture, respiratory (NON-Expectorated)     Status: None (Preliminary result)   Collection Time: 02/25/17  4:03 PM  Result Value Ref Range Status   Specimen Description INDUCED SPUTUM  Final   Special Requests NONE  Final   Gram Stain   Final    ABUNDANT WBC PRESENT,BOTH PMN AND MONONUCLEAR FEW SQUAMOUS EPITHELIAL CELLS PRESENT MODERATE GRAM NEGATIVE RODS MODERATE GRAM POSITIVE COCCI IN CHAINS IN CLUSTERS FEW GRAM POSITIVE RODS RARE BUDDING YEAST SEEN    Culture TOO YOUNG TO READ  Final   Report Status PENDING  Incomplete   Assessment: Richard [name] is a 81 y/o that presented with SOB and dyspnea with a PMH significant for COPD, wegener's, and CAD. He has become progressively more dyspneic and was unable to ambulate without severe SOB. Levaquin was started on 07/16 by his pulmonologist. He is not improving on levaquin and pharmacy was consulted to start vancomycin and cefepime for HAP coverage.  Pt. Is afebrile, WBC WNL, Scr 1.19 which is down from admit of 2.07>>1.76>>1.68. Blood pressure stable, tachycardic (99-112); Sputum Cx shows WBC, GNR/GPC, but too young to read a speciation.  Goal of  Therapy:  Vancomycin trough level 15-20 mcg/ml  Plan:  Cefepime 1g Q24h Vancomycin load of 750 mg x 1 Vancomycin maintenance 500 mg Q24h Obtain vancomycin trough at steady state (07/26) Monitor renal function, clinical status   Patterson Hammersmith PharmD PGY1 Pharmacy Practice Resident 02/27/2017 10:55 AM Pager: [phone]

## 2017-02-28 DIAGNOSIS — Z7189 Other specified counseling: Secondary | ICD-10-CM

## 2017-02-28 DIAGNOSIS — R0602 Shortness of breath: Secondary | ICD-10-CM

## 2017-02-28 DIAGNOSIS — R0902 Hypoxemia: Secondary | ICD-10-CM

## 2017-02-28 DIAGNOSIS — J471 Bronchiectasis with (acute) exacerbation: Principal | ICD-10-CM

## 2017-02-28 DIAGNOSIS — R079 Chest pain, unspecified: Secondary | ICD-10-CM

## 2017-02-28 LAB — ANCA TITERS
Atypical P-ANCA titer: NEGATIVE titer
C-ANCA: NEGATIVE titer
P-ANCA: NEGATIVE titer

## 2017-02-28 LAB — CREATININE, SERUM
Creatinine, Ser: 1.12 mg/dL (ref 0.61–1.24)
GFR calc non Af Amer: >60 mL/min (ref >60)

## 2017-02-28 LAB — CULTURE, RESPIRATORY W GRAM STAIN: Culture: NORMAL

## 2017-02-28 MED ORDER — SENNA 8.6 MG PO TABS
1.0000 | ORAL_TABLET | Freq: Two times a day (BID) | ORAL | Status: DC
  Administered 2017-03-01 – 2017-03-05 (×8): 8.6 mg via ORAL
  Filled 2017-02-28 (×14): qty 1

## 2017-02-28 MED ORDER — SODIUM CHLORIDE 3 % IN NEBU
4.0000 mL | INHALATION_SOLUTION | Freq: Two times a day (BID) | RESPIRATORY_TRACT | Status: DC
  Administered 2017-02-28 – 2017-03-03 (×5): 4 mL via RESPIRATORY_TRACT
  Filled 2017-02-28 (×8): qty 4

## 2017-02-28 NOTE — Progress Notes (Signed)
PT Cancellation Note  Patient Details Name: Richard [name] MRN: [id number] DOB: [birth year]   Cancelled Treatment:    Reason Eval/Treat Not Completed: Patient's level of consciousness;Patient declined, no reason specified.  Pt's family declined mobility, stating pt just was up to any mobility.  Will try back Wednesday as able. 02/28/2017  Donnella Sham, [place] [phone]  (pager)   Tessie Fass Jsaon Yoo 02/28/2017, 1:06 PM

## 2017-02-28 NOTE — Progress Notes (Signed)
  Speech Language Pathology Treatment: Dysphagia  Patient Details Name: Richard [name] MRN: [id number] DOB: [birth year] Today's Date: 02/28/2017 Time: 1205-1214 SLP Time Calculation (min) (ACUTE ONLY): 9 min  Assessment / Plan / Recommendation Clinical Impression  Session focused on education with family as pt currently sleeping. Notes from Palliative care read; family meeting 7/25. Reviewed (briefly) MBS results, recommendations and esophageal deficits. Pt currently receiving regular and daughter reports ordering softer items (soups, mashed potatoes etc) for meals and he has been tolerating. Reviewed esophageal precautions (sit upright and remain up 45 min after meals, alternate liquids and solids, rest breaks if needed). Answered all questions, ST will sign of at this time.    HPI HPI: 81 y.o.malewith medical history significant of Wegener's granulomatosis, PUD and esophagitis, hypertension, hiatal hernia, diverticulosis, COPD, BPH, CAD status post cardiac catheterization and stent placement. Patient presented with multiple generalized and focal complaints all likely related to cardiopulmonary disease. Per MD notes pt has been complaining of dysphagia with food and liquids. He has had this problem before and wasseen by Dr. Fuller Plan and reports having esophageal dilatation in the past. Orders for MBS per MD. Upper GI 12/242017: benign-appearing esophageal stenosis, 5cm hiatal hernia.        SLP Plan  All goals met;Discharge SLP treatment due to (comment)       Recommendations  Diet recommendations: Regular;Thin liquid Liquids provided via: Cup;Straw Medication Administration: Crushed with puree Supervision: Patient able to self feed;Staff to assist with self feeding;Full supervision/cueing for compensatory strategies Compensations: Slow rate;Small sips/bites;Multiple dry swallows after each bite/sip;Clear throat intermittently;Follow solids with liquid Postural Changes and/or Swallow  Maneuvers: Seated upright 90 degrees;Upright 30-60 min after meal                Oral Care Recommendations: Oral care BID Follow up Recommendations: None SLP Visit Diagnosis: Dysphagia, pharyngoesophageal phase (R13.14) Plan: All goals met;Discharge SLP treatment due to (comment)       GO                Houston Siren 02/28/2017, 12:17 PM  Orbie Pyo Colvin Caroli.Ed [employer] [phone]

## 2017-02-28 NOTE — Progress Notes (Signed)
OT Cancellation Note  Patient Details Name: Richard [name] MRN: [id number] DOB: January 18, 1936   Cancelled Treatment:    Reason Eval/Treat Not Completed: Patient declined, no reason specified; Pt reporting not feeling well and declining OT tx session, declining EOB/bed level ADLs, educated Pt on benefits of EOB/OOB with Pt verbalizing understanding however declines attempts at this time. Will check back at a later date.  Lou Cal, OT Pager [phone] 02/28/2017   [name] 02/28/2017, 1:59 PM

## 2017-02-28 NOTE — Progress Notes (Signed)
Nutrition Follow-up  DOCUMENTATION CODES:   Severe malnutrition in context of acute illness/injury  INTERVENTION:   -D/c Boost Breeze po TID, each supplement provides 250 kcal and 9 grams of protein -Continue Mighty Shake BID -RD will adjust nutrition care plan based on goals of care   NUTRITION DIAGNOSIS:   Malnutrition (Severe) related to acute illness (FTT) as evidenced by percent weight loss, severe depletion of muscle mass, severe depletion of body fat.  Ongoing  GOAL:   Patient will meet greater than or equal to 90% of their needs  Unmet  MONITOR:   PO intake, Supplement acceptance, Weight trends  REASON FOR ASSESSMENT:   Consult Poor PO  ASSESSMENT:   Pt with PMH of PUD/esophagtitis, HTN, hital hernia, diverticulosis, CKD, CAD with CABG, COPD, and Wegener's granulomatosis. Presents this admission with acute on chronic hypoxic respiratory failure and FTT.   S/p MBSS on 02/26/17; pt with moderate aspiration risk. Recommending pt remains regular diet with thin liquids, while avoiding mixed consistencies. Pt family has been ordering softer textured foods such as soups and mashed potatoes for increased tolerance.   Reviewed GI note from 02/27/17; pt with oropharyngeal dysphagia from cricopharyngeal dysfunction and global GI dysmotility, causing malnutrition/failure to thrive, which has progressed due to recent illnesses. Pt is a poor PEG candidate.   Intake remains poor; PO 0-50% since last evaluation. Per MD notes, pt has been a little more alert today and shown interest in eating. However, pt has refused many supplements and medications today.   Palliatvie care team following. Family meeting planned for tomorrow, 03/01/17.   Labs reviewed: Na: 146.  Diet Order:  Diet regular Room service appropriate? Yes; Fluid consistency: Thin  Skin:   (stage I sacrum)  Last BM:  02/19/17  Height:   Ht Readings from Last 1 Encounters:  02/28/2017 5\' 7"  (1.702 m)    Weight:    Wt Readings from Last 1 Encounters:  02/19/2017 89 lb 14.4 oz (40.8 kg)    Ideal Body Weight:  61.4 kg  BMI:  Body mass index is 14.08 kg/m.  Estimated Nutritional Needs:   Kcal:  1500-1700 (36-41 kcal/kg)  Protein:  85-95 grams (1.4-1.5 g/kg IBW)  Fluid:  >1.5 L/day  EDUCATION NEEDS:   No education needs identified at this time  Minh Jasper A. Jimmye Norman, RD, LDN, CDE Pager: [phone] After hours Pager: [phone]

## 2017-02-28 NOTE — Progress Notes (Signed)
PULMONARY / CRITICAL CARE MEDICINE   Name: FEDRICK CEFALU MRN: [id number] DOB: 01-Mar-1936    ADMISSION DATE:  03/06/2017 CONSULTATION DATE:  02/25/17  REFERRING MD:  Wendee Beavers   CHIEF COMPLAINT:  Cough/sob/ dysphagia   HISTORY OF PRESENT ILLNESS:     81  yowm never smoker with dx of Longstanding bronchiectasis then dx with WG 03/2010 seen in office 02/20/17 on an extremely complex medical regimen but not using flutter valve and unalble to tol vest with FTT/ worsening doe x 6 weeks and persistent purulent sputum so rec empirical levaquin/ flutter valve and increase pred to 20 mg per day until improvement but came to ER 7/17  "no better" with new transient cp and Aflutter and PCCM consulted am 7/21 unable to cough up secretions well but when does are quite yellow, not bloody, with new report of dysphagia x 2 weeks not brought up as a concern in office on 02/13/15 assoc with profound anorexia and concern for dehydration but no evidence for this on lab 7/16.  PCCM service asked for imput into airways management am 7/21      SUBJECTIVE:   No better, no worse  VITAL SIGNS: BP 130/79 (BP Location: Right Arm)   Pulse 99   Temp (!) 97.5 F (36.4 C) (Oral)   Resp 19   Ht 5\' 7"  (1.702 m)   Wt 89 lb 14.4 oz (40.8 kg)   SpO2 99%   BMI 14.08 kg/m   FIO2 =  5 liters    INTAKE / OUTPUT:  Intake/Output Summary (Last 24 hours) at 02/28/17 1051 Last data filed at 02/28/17 4174  Gross per 24 hour  Intake           848.75 ml  Output              725 ml  Net           123.75 ml     PHYSICAL EXAMINATION Physical Exam  Constitutional: He is oriented to person, place, and time. He appears well-developed and well-nourished.  HENT:  Head: Normocephalic.  Eyes: Pupils are equal, round, and reactive to light.  Neck: Normal range of motion.  Cardiovascular: Normal rate and regular rhythm.   Pulmonary/Chest: Accessory muscle usage present. He has wheezes. He has rhonchi.  Abdominal: Normal appearance  and bowel sounds are normal. There is no tenderness.  Musculoskeletal: Normal range of motion.  Neurological: He is alert and oriented to person, place, and time.  Skin: Skin is warm, dry and intact. Bruising and ecchymosis noted.  Psychiatric: He is withdrawn. He exhibits a depressed mood.    LABS:  BMET  Recent Labs Lab 02/22/17 0348 02/23/17 0243 02/27/17 0013 02/28/17 0350  NA 143 146* 146*  --   K 4.5 5.1 4.4  --   CL 111 113* 115*  --   CO2 24 25 25   --   BUN 57* 59* 34*  --   CREATININE 1.76* 1.68* 1.19 1.12  GLUCOSE 117* 163* 150*  --     Electrolytes  Recent Labs Lab 02/22/17 0348 02/23/17 0243 02/27/17 0013  CALCIUM 8.0* 7.8* 7.9*    CBC  Recent Labs Lab 02/24/17 0430 02/25/17 0416 02/27/17 0013  WBC 5.1 7.0 5.5  HGB 8.7* 8.4* 7.7*  HCT 28.6* 27.6* 25.2*  PLT 114* 99* 69*    Coag's No results for input(s): APTT, INR in the last 168 hours.  Sepsis Markers No results for input(s): LATICACIDVEN, PROCALCITON, O2SATVEN in the last  168 hours.  ABG  Recent Labs Lab 02/16/2017 1316  PHART 7.446  PCO2ART 30.5*  PO2ART 85.0    Liver Enzymes No results for input(s): AST, ALT, ALKPHOS, BILITOT, ALBUMIN in the last 168 hours.  Cardiac Enzymes  Recent Labs Lab 02/20/2017 1403 02/13/2017 1646 02/17/2017 1930  TROPONINI <0.03 <0.03 <0.03    Glucose No results for input(s): GLUCAP in the last 168 hours.    STUDIES:   ANCA titers 7/22>>>  CULTURES: Sputum  7/21 >normal flora   ANTIBIOTICS: Levaquin 7/16 (aeBronchiectasis) >>> changed to vanc and cefepime 7/23    ASSESSMENT / PLAN:  Acute hypoxic respiratory failure in setting of Acute exac obst bronchiectasis suspect complicated by chronic & worsening dysphagia, GI dysmotility and FTT.  - PFT's 11/22/2012 FEV1  1.52 (62%) arnd 46 and no change p B2 DLCO 89%  - can't tolerate chest vest d/t Cervical fracture - sputum w/ normal flora  - abx changed today d/t lack of clinical improvement   Plan Wean oxygen  Cont pulm hygiene measures Cont hypertonic saline  I think he should consider DNR. He would be a terrible candidate for life support Appreciate palliative care in-put  Cont current abx  Severe dysphagia -See GI's note re: logistics of PEG. Agree poor candidate Plan Cont asp precautions   CRI/ WG  W/ hyperchloremia and hypernatremia Plan Trend chemistry   CAF  Plan Per primary   Erick Colace ACNP-BC [place] Pager # [phone] OR # [phone] if no answer  Attending Note:  81 year old male with end stage bronchiectasis at this point presenting with acute on chronic respiratory failure.  On exam, diffuse rhonchi noted. I reviewed CXR myself, worsening LUL infiltrate noted.  Discussed with PCCM-NP.    Hypoxemia:  - Titrate O2 for sat of 88-92%  - Need to have home O2 arranged.  Acute on chronic respiratory failure:  - Flutter valve  - IS  - O2 as above  Bronchiectasis  - Abx as ordered  - Cultures are negative (normal flora)  - Increase hypertonic saline nebs to BID to assist with mucous clearance  - No vibravest (fractures)  Goals of care discussion:  - Spent 1 hour with patient and family discussing goals of care.  Full code for now but agreed to meet with palliative care, meeting set for [facility] tomorrow.  - Will continue to discuss goals of care, recommend a full DNR and home with hospice.  Patient seen and examined, agree with above note.  I dictated the care and orders written for this patient under my direction.  Rush Farmer, [place]

## 2017-02-28 NOTE — Progress Notes (Signed)
PROGRESS NOTE  Patient seen on 02/24/17 note entered next day.  Richard [name]  VOH:[id number] DOB: [birth year] DOA: 02/13/2017 PCP: [name], MD    Brief Narrative:  81 y.o. male with medical history significant of Wegener's granulomatosis, PUD and esophagitis, hypertension, hiatal hernia, diverticulosis, COPD, BPH, CAD status post cardiac catheterization and stent placement. Patient presented with multiple generalized and focal complaints all likely related to cardiopulmonary disease.   Dyspnea on exertion. Present for approximately the last 6 weeks. Gradual worsening.  Patient is currently being treated for acute on chronic hypoxic respiratory failure presumed to be secondary to COPD exacerbation, pulmonology on board.    Assessment & Plan:     Acute on chronic respiratory failure [facility]) - Patient has a complicated respiratory/pulmonary history.  - Pulmonologist consulted. - Mild Improvement on Levaquin, Dulera. Continue supportive therapy for symptom management with Tessalon and Hycodan. - Patient currently on Solu-Medrol  - I have broadened antibiotic coverage given worsening infiltrate on x-ray. Pulmonology team is agreeable. Cultures are negative reporting normal flora. Patient has end-stage bronchiectasis. I spent more than 30 minutes with patient and family updating them about prognosis and limited medical options given his pulmonary disease and the progressive nature of his Wegener's disease.   Dysphagia -Patient was seen by ENT specialist who recommended speech therapy and medical treatment. No surgery found. 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 - Gastroenterologist has seen patient in the past and patient has undergone esophageal dilatation. Unfortunately given respiratory condition they would not be able to do any dilatation  Active Problems:   Wegener's granulomatosis ([place]) - Cytotoxin discontinued in lieu of active  infection    Chronic kidney disease, stage IV (severe) (HCC) - Serum creatinine stable    Protein-calorie malnutrition, severe (HCC) - Agree with continuing ensure - Most likely due to dysphagia.     Hypotension - Resolved    Chest pressure - None reported today troponin negative 3    FTT (failure to thrive) in adult - Suspect combination of principal problem and protein calorie malnutrition.    Pressure injury of skin   PAF (paroxysmal atrial fibrillation) ([place]) - Patient is currently rate controlled on metoprolol and is currently on aspirin. Suspect patient is not a anticoagulant candidate despite  Chadvasc2 score is more than 2, due to generalized weakness and increased risk of falls.   DVT prophylaxis: Heparin Code Status: Full Family Communication: discussed with daughter and son at bedside Disposition Plan: Pending palliative evaluation and goals of care delineation   Consultants:  GI Pulmonology ENT Palliative care   Procedures: none   Antimicrobials: Levaquin   Subjective: I have discussed case with patient and family and answered their questions to their satisfaction. Patient reports no new complaints today  Objective: Vitals:   02/27/17 2026 02/27/17 2141 02/28/17 0507 02/28/17 1555  BP:  (!) 145/76 130/79 111/64  Pulse:  93 99 (!) 108  Resp:  19 19 (!) 24  Temp:  (!) 97.5 F (36.4 C) (!) 97.5 F (36.4 C) 98.3 F (36.8 C)  TempSrc:  Oral Oral   SpO2: 95% 100% 99% 100%  Weight:      Height:        Intake/Output Summary (Last 24 hours) at 02/28/17 1840 Last data filed at 02/28/17 1514  Gross per 24 hour  Intake          1048.75 ml  Output  325 ml  Net           723.75 ml   Filed Weights   02/09/2017 1021 02/22/2017 1643  Weight: 43.1 kg (95 lb) 40.8 kg (89 lb 14.4 oz)    Examination:   General exam: Awake and alert, in no acute distress Respiratory system: Equal chest rise, no cyanosis, inaudible wheezes, nasal cannula in  place Cardiovascular system: irregularly irregular,  + murmur no rubs or gallops  Gastrointestinal system: Abdomen is nondistended, soft and nontender. No organomegaly or masses felt. Normal bowel sounds heard. Central nervous system: Alert and oriented. No focal neurological deficits. Extremities: Warm and dry  Skin: No rashes, lesions or ulcers, on limited exam  Psychiatry:  Difficult to assess secondary to hypersomnolence   Data Reviewed: I have personally reviewed following labs and imaging studies  CBC:  Recent Labs Lab 02/22/17 0348 02/23/17 0243 02/24/17 0430 02/25/17 0416 02/27/17 0013  WBC 4.0 4.3 5.1 7.0 5.5  HGB 8.3* 8.8* 8.7* 8.4* 7.7*  HCT 26.3* 28.4* 28.6* 27.6* 25.2*  MCV 110.0* 111.8* 113.0* 112.7* 112.5*  PLT 116* 123* 114* 99* 69*   Basic Metabolic Panel:  Recent Labs Lab 02/22/17 0348 02/23/17 0243 02/27/17 0013 02/28/17 0350  NA 143 146* 146*  --   K 4.5 5.1 4.4  --   CL 111 113* 115*  --   CO2 24 25 25   --   GLUCOSE 117* 163* 150*  --   BUN 57* 59* 34*  --   CREATININE 1.76* 1.68* 1.19 1.12  CALCIUM 8.0* 7.8* 7.9*  --    GFR: Estimated Creatinine Clearance: 31.4 mL/min (by C-G formula based on SCr of 1.12 mg/dL). Liver Function Tests: No results for input(s): AST, ALT, ALKPHOS, BILITOT, PROT, ALBUMIN in the last 168 hours. No results for input(s): LIPASE, AMYLASE in the last 168 hours. No results for input(s): AMMONIA in the last 168 hours. Coagulation Profile: No results for input(s): INR, PROTIME in the last 168 hours. Cardiac Enzymes:  Recent Labs Lab 03/01/2017 1930  TROPONINI <0.03   BNP (last 3 results) No results for input(s): PROBNP in the last 8760 hours. HbA1C: No results for input(s): HGBA1C in the last 72 hours. CBG: No results for input(s): GLUCAP in the last 168 hours. Lipid Profile: No results for input(s): CHOL, HDL, LDLCALC, TRIG, CHOLHDL, LDLDIRECT in the last 72 hours. Thyroid Function Tests: No results for  input(s): TSH, T4TOTAL, FREET4, T3FREE, THYROIDAB in the last 72 hours. Anemia Panel: No results for input(s): VITAMINB12, FOLATE, FERRITIN, TIBC, IRON, RETICCTPCT in the last 72 hours. Sepsis Labs: No results for input(s): PROCALCITON, LATICACIDVEN in the last 168 hours.  Recent Results (from the past 240 hour(s))  Culture, respiratory (NON-Expectorated)     Status: None   Collection Time: 02/25/17  4:03 PM  Result Value Ref Range Status   Specimen Description INDUCED SPUTUM  Final   Special Requests NONE  Final   Gram Stain   Final    ABUNDANT WBC PRESENT,BOTH PMN AND MONONUCLEAR FEW SQUAMOUS EPITHELIAL CELLS PRESENT MODERATE GRAM NEGATIVE RODS MODERATE GRAM POSITIVE COCCI IN CHAINS IN CLUSTERS FEW GRAM POSITIVE RODS RARE BUDDING YEAST SEEN    Culture Consistent with normal respiratory flora.  Final   Report Status 02/28/2017 FINAL  Final         Radiology Studies: Dg Esophagus  Result Date: 02/27/2017 CLINICAL DATA:  Dysphagia EXAM: ESOPHOGRAM/BARIUM SWALLOW TECHNIQUE: Single contrast examination was performed using  and. FLUOROSCOPY TIME:  Fluoroscopy Time:  1 minutes 6 seconds Radiation Exposure Index (if provided by the fluoroscopic device): Number of Acquired Spot Images: 0 COMPARISON:  Modified barium swallow performed yesterday FINDINGS: Study is limited by patient's condition and inability to swallow large volumes, only small sips for noted. There is mild narrowing in the proximal cervical esophagus. When compared to prior modified barium swallow, this may be related to prominent cricopharyngeus impression. No diverticulum noted. Extensive tertiary contractions throughout the remainder of the esophagus. Large hiatal hernia noted. IMPRESSION: Mild narrowing in the proximal cervical esophagus may be related to prominent cricopharyngeus impression. No diverticulum. Large hiatal hernia. Dysmotility, esophageal spasm. Electronically Signed   By: Rolm Baptise M.D.   On: 02/27/2017  08:36     Scheduled Meds: . arformoterol  15 mcg Nebulization BID  . aspirin EC  81 mg Oral Daily  . budesonide (PULMICORT) nebulizer solution  0.25 mg Nebulization BID  . calcitRIOL  0.25 mcg Oral Daily  . famotidine  20 mg Oral QHS  . finasteride  5 mg Oral Daily  . heparin  5,000 Units Subcutaneous Q8H  . methylPREDNISolone (SOLU-MEDROL) injection  60 mg Intravenous Daily  . metoprolol succinate  25 mg Oral Daily  . pantoprazole  40 mg Oral QAC breakfast  . PARoxetine  20 mg Oral Daily  . psyllium  1 packet Oral Daily  . senna  1 tablet Oral BID  . simvastatin  20 mg Oral q1800  . sodium bicarbonate  1,300 mg Oral BID  . sodium chloride HYPERTONIC  4 mL Nebulization BID  . tamsulosin  0.4 mg Oral Daily   Continuous Infusions: . ceFEPime (MAXIPIME) IV Stopped (02/28/17 1334)  . vancomycin Stopped (02/28/17 1514)     LOS: 7 days    Time spent: > 35 minutes, had meeting where I spoke with daughter, son, and patient in the room. Spent more than 50% of the time answering questions and concerns.  Velvet Bathe, MD [facility] Pager [phone]  If 7PM-7AM, please contact night-coverage [email or web address] Password [facility] 02/28/2017, 6:40 PM

## 2017-02-28 NOTE — Consult Note (Signed)
Consultation Note Date: 02/28/2017   Patient Name: Richard [name]  DOB: March 07, 1936  MRN: [id number]  Age / Sex: 81 y.o., male  PCP: [name], MD Referring Physician: Velvet Bathe, MD  Reason for Consultation: Establishing goals of care and Psychosocial/spiritual support  HPI/Patient Profile: 81 y.o. male  with past medical history of obstructive bronchiectasis then diagnosed with Wegener's Granulomatosis with renal and pulmonary involvement (initial diagnosis 2011), COPD, CAD s/p cardiac cath and stent placement, HTN, dysphagia, PUD, and esophagitis. Pt presented to the ED with multiple medical issues, which included worsening dyspnea on exertion, chest pain, and appetite loss with progressive weakness and fatigue. He was seen by Pulmonology one day prior to admission. He was admitted on 03/01/2017 for acute on chronic hypoxic respiratory failure secondary to acute exacerbation of obstructive bronchiectasis, which is complicated by FTT from chronic dysphagia and poor oral intake. GI consulted for dysphagia and identified a cricopharyngeal dysfunction with global GI dysmotility; they have little to offer to help with this and are suggesting Palliative. Pulmonology also following but are concerned that he is approaching the end of his life. Per GI notes: "He has then been in a progressive cycle of pain [thoracic spine fx 2 mo ago], poor nutrition, recurrent pulmonary infection, weakness and greater difficulty swallowing."   Clinical Assessment and Goals of Care: I met with Richard [name] at his bedside. His daughter Maudie Mercury was present. He had a very flat affect and was quite withdrawn during our meeting. I introduced myself and explained my role on his care team. I specifically explained the role of Palliative Care in helping to synthesize the information from all of the teams, talk through the information with patient's and their family, and then help facilitate  conversations about options moving forward and how those options may fit into the patient's goals. Richard [name] was appreciative of my visit and felt he and his family would benefit from a good conversation where they could all hear the same information and talk about it. As his wife has been feeling poorly and would not be able to be at the hospital until tomorrow.   On my way out of the room I was able to catch his other two children in the hall. I shared with them the plan to have a full family meeting tomorrow at 2pm. I also gave a gentle review of some of the things we would cover, which includes his health trend, his current issues and management options, and what the future might look like with different care options.The patient's son was immediately tearful as I mentioned these things, but was similarly appreciative of the chance to have this discussion as a family.  This first meeting was utilized to introduce the concept of palliative care, build rapport, explore family dynamics, and assess the patient for symptom burdens.  Primary Decision Maker PATIENT   SUMMARY OF RECOMMENDATIONS    Plan for full family meeting on 7/25 at [place]:  Full code   Symptom Management:   Pt with shortness of breath at rest, he feels it is similar to yesterday. He also has minimal appetite but wanted a chance to wake up and try breakfast (he was intertested in trying food this morning, which changed from yesterday). I suspect he will benefit from low dose Zyprexa, which I will introduce to the family during our meeting tomorrow.   Palliative Prophylaxis:   Aspiration and Frequent Pain Assessment  Additional Recommendations (Limitations, Scope, Preferences):  Full Scope Treatment  Psycho-social/Spiritual:   Desire for further Chaplaincy support:no  Additional Recommendations: TBD  Prognosis:   < 6 months. Pt with recurrent respiratory compromise d/t obstructive  bronchiectasis and Wegener's Granulomatosis, which is complicated by FTT from chronic dysphagia and poor oral intake.   Discharge Planning: To Be Determined      Primary Diagnoses: Present on Admission: . Acute respiratory failure with hypoxia ([place]) . Chronic kidney disease, stage IV (severe) ([place]) . Hypotension . Protein-calorie malnutrition, severe ([place]) . Wegener's granulomatosis ([place]) . PAF (paroxysmal atrial fibrillation) ([place])   I have reviewed the medical record, interviewed the patient and family, and examined the patient. The following aspects are pertinent.  Past Medical History:  Diagnosis Date  . Adenomatous colon polyp   . Anemia   . Asthma   . BPH (benign prostatic hyperplasia)    sees Dr. Risa Grill, biopsy June 2015 was benign   . Bronchiectasis ([place])   . CAD (coronary artery disease)    a. [place] s/p [place] x3 and ultimately BMS to pLAD (vision BMS 3.0 x 18), POBA to CTO of Cx-OM 03/17/14 and staged Rotablator-PCI w/ BMS (Mini Vision 2.5 x 28) to mRCA on 03/21/14 (unsuitable for CABG).  . Chronic bronchitis ([place])   . CKD (chronic kidney disease), stage IV ([place])   . COPD (chronic obstructive pulmonary disease) ([place])   . Diverticulosis   . Femoral artery pseudo-aneurysm, right ([place])    a. s/p repair. Follow up with Dr. Kellie Simmering   . Fungal infection    lungs  . GERD (gastroesophageal reflux disease)   . GI bleed    a. 10/2016 - Plavix stopped.  . Hiatal hernia   . History of blood transfusion 07/2016; 11/01/2016   "low blood; low blood"  . History of kidney stones   . HOH (hard of hearing)    bilaterally  . HTN (hypertension) 09/28/2013  . Ischemic cardiomyopathy    a. Prior EF 40-45% by echo in 2015, improved in 2016.  . Mild mitral regurgitation   . Moderate aortic stenosis by prior echocardiogram   . Peptic stricture of esophagus   . Pneumonia    "1-2 times" (11/01/2016)  . Wegener's granulomatosis ([place])    sees Dr. [name] ; "went from my lungs to my kidneys"  (11/01/2016)   Social History   Social History  . Marital status: Married    Spouse name: N/A  . Number of children: N/A  . Years of education: N/A   Occupational History  . Retired    Social History Main Topics  . Smoking status: Never Smoker  . Smokeless tobacco: Never Used  . Alcohol use No  . Drug use: No  . Sexual activity: Not Asked   Other Topics Concern  . None   Social History Narrative  . None   Family History  Problem Relation Age of Onset  . Asthma Father   . Coronary artery disease Mother   . Coronary artery disease Brother   . Coronary artery disease Brother   . Colon cancer Neg Hx   . Stomach cancer Neg Hx    Scheduled Meds: . arformoterol  15 mcg Nebulization BID  . aspirin EC  81 mg Oral Daily  . budesonide (PULMICORT) nebulizer solution  0.25 mg Nebulization BID  . calcitRIOL  0.25 mcg Oral Daily  . famotidine  20 mg Oral QHS  . feeding supplement  1 Container Oral TID BM  . finasteride  5 mg Oral Daily  .  heparin  5,000 Units Subcutaneous Q8H  . methylPREDNISolone (SOLU-MEDROL) injection  60 mg Intravenous Daily  . metoprolol succinate  25 mg Oral Daily  . pantoprazole  40 mg Oral QAC breakfast  . PARoxetine  20 mg Oral Daily  . psyllium  1 packet Oral Daily  . simvastatin  20 mg Oral q1800  . sodium bicarbonate  1,300 mg Oral BID  . sodium chloride HYPERTONIC  4 mL Nebulization Daily  . tamsulosin  0.4 mg Oral Daily   Continuous Infusions: . sodium chloride 75 mL/hr at 02/28/17 0324  . ceFEPime (MAXIPIME) IV Stopped (02/27/17 1405)  . vancomycin     PRN Meds:.acetaminophen **OR** acetaminophen, albuterol, benzonatate, gi cocktail, HYDROcodone-homatropine, lidocaine, lidocaine, lidocaine-EPINEPHrine, nitroGLYCERIN, ondansetron **OR** ondansetron (ZOFRAN) IV, oxyCODONE-acetaminophen **AND** oxyCODONE, oxymetazoline, silver nitrate applicators, simethicone, TRIPLE ANTIBIOTIC No Known Allergies   Review of Systems  Constitutional: Positive  for activity change, appetite change, fatigue and unexpected weight change (ongoing weight loss).  HENT: Positive for trouble swallowing. Negative for congestion, hearing loss, sneezing and sore throat.   Eyes: Negative for visual disturbance.  Respiratory: Positive for cough, shortness of breath and wheezing. Negative for chest tightness.   Cardiovascular: Positive for chest pain. Negative for leg swelling.  Gastrointestinal: Negative for abdominal distention, abdominal pain, nausea and vomiting.  Genitourinary: Negative for flank pain.  Musculoskeletal: Negative for back pain.  Neurological: Positive for weakness. Negative for dizziness and headaches.  Hematological: Bruises/bleeds easily.  Psychiatric/Behavioral: Negative for confusion, decreased concentration and sleep disturbance. The patient is not nervous/anxious.    Physical Exam  Constitutional: He is oriented to person, place, and time. He appears cachectic. He has a sickly appearance. Nasal cannula in place.  HENT:  Head: Normocephalic and atraumatic.  Mouth/Throat: Oropharynx is clear and moist. Mucous membranes are dry. No oropharyngeal exudate.  Eyes: EOM are normal.  Neck: Normal range of motion. Neck supple.  Cardiovascular: Normal rate.  An irregularly irregular rhythm present.  Murmur heard. Pulmonary/Chest: Accessory muscle usage present. He has decreased breath sounds in the right lower field and the left lower field. He has wheezes (expiratory) in the right upper field and the left upper field. He has rhonchi (scattered).  Abdominal: Soft. Bowel sounds are decreased.  Musculoskeletal: Normal range of motion. He exhibits no edema.  Neurological: He is alert and oriented to person, place, and time.  Skin: Skin is warm and dry. Bruising noted. There is pallor.  Psychiatric: Judgment and thought content normal. His speech is delayed. He is slowed and withdrawn. Cognition and memory are normal.  Flat affect.    Vital  Signs: BP 130/79 (BP Location: Right Arm)   Pulse 99   Temp (!) 97.5 F (36.4 C) (Oral)   Resp 19   Ht '5\' 7"'  (1.702 m)   Wt 40.8 kg (89 lb 14.4 oz)   SpO2 99%   BMI 14.08 kg/m  Pain Assessment: 0-10   Pain Score: Asleep   SpO2: SpO2: 99 % O2 Device:SpO2: 99 % O2 Flow Rate: .O2 Flow Rate (L/min): 5 L/min  IO: Intake/output summary:  Intake/Output Summary (Last 24 hours) at 02/28/17 0750 Last data filed at 02/28/17 0619  Gross per 24 hour  Intake           848.75 ml  Output              725 ml  Net           123.75 ml    LBM: Last BM Date: 02/19/17  Baseline Weight: Weight: 43.1 kg (95 lb) Most recent weight: Weight: 40.8 kg (89 lb 14.4 oz)     Palliative Assessment/Data: PPS 40%    Time Total: 50 minutes Greater than 50%  of this time was spent counseling and coordinating care related to the above assessment and plan.  Signed by: Charlynn Court, NP Palliative Medicine Team Pager # [phone] (M-F 7a-5p) Team Phone # [phone] (Nights/Weekends)

## 2017-02-28 NOTE — Telephone Encounter (Signed)
Pt was hospitalized.

## 2017-03-01 ENCOUNTER — Other Ambulatory Visit: Payer: Self-pay

## 2017-03-01 LAB — CREATININE, SERUM
CREATININE: 1.09 mg/dL (ref 0.61–1.24)
GFR calc Af Amer: >60 mL/min (ref >60)
GFR calc non Af Amer: >60 mL/min (ref >60)

## 2017-03-01 MED ORDER — BISACODYL 10 MG RE SUPP
10.0000 mg | Freq: Once | RECTAL | Status: AC
  Administered 2017-03-01: 10 mg via RECTAL
  Filled 2017-03-01: qty 1

## 2017-03-01 MED ORDER — GUAIFENESIN ER 600 MG PO TB12
1200.0000 mg | ORAL_TABLET | Freq: Two times a day (BID) | ORAL | Status: DC
  Filled 2017-03-01: qty 2

## 2017-03-01 MED ORDER — BOOST / RESOURCE BREEZE PO LIQD
1.0000 | Freq: Three times a day (TID) | ORAL | Status: DC
  Administered 2017-03-01 – 2017-03-06 (×10): 1 via ORAL

## 2017-03-01 MED ORDER — GUAIFENESIN 100 MG/5ML PO SOLN
5.0000 mL | ORAL | Status: DC | PRN
  Administered 2017-03-01: 100 mg via ORAL
  Filled 2017-03-01: qty 5

## 2017-03-01 MED ORDER — ORAL CARE MOUTH RINSE
15.0000 mL | Freq: Two times a day (BID) | OROMUCOSAL | Status: DC
  Administered 2017-03-01 – 2017-03-07 (×10): 15 mL via OROMUCOSAL

## 2017-03-01 NOTE — Progress Notes (Signed)
Daughter concerned that continuous oxygen saturations not showing on tele box, called pulmonary and they referred back to attending . Text paged [place]. Order obtained and placed

## 2017-03-01 NOTE — Progress Notes (Signed)
PT Cancellation Note  Patient Details Name: Richard [name] MRN: [id number] DOB: [birth year]   Cancelled Treatment:    Reason Eval/Treat Not Completed: Patient declined, no reason specified.  Pt stated he was able to get OOB or do exercise today. 03/01/2017  Donnella Sham, PT [phone]  (pager)   Tessie Fass Kylor Valverde 03/01/2017, 12:01 PM

## 2017-03-01 NOTE — Progress Notes (Signed)
PULMONARY / CRITICAL CARE MEDICINE   Name: Richard [name] MRN: [id number] DOB: [birth year]    ADMISSION DATE:  02/25/2017 CONSULTATION DATE:  02/25/17  REFERRING MD:  Wendee Beavers   CHIEF COMPLAINT:  Cough/sob/ dysphagia   HISTORY OF PRESENT ILLNESS:     54  yowm never smoker with dx of Longstanding bronchiectasis then dx with WG 03/2010 seen in office 02/20/17 on an extremely complex medical regimen but not using flutter valve and unalble to tol vest with FTT/ worsening doe x 6 weeks and persistent purulent sputum so rec empirical levaquin/ flutter valve and increase pred to 20 mg per day until improvement but came to ER 7/17  "no better" with new transient cp and Aflutter and PCCM consulted am 7/21 unable to cough up secretions well but when does are quite yellow, not bloody, with new report of dysphagia x 2 weeks not brought up as a concern in office on 02/13/15 assoc with profound anorexia and concern for dehydration but no evidence for this on lab 7/16.  PCCM service asked for imput into airways management am 7/21      SUBJECTIVE:   No better, no worse  VITAL SIGNS: BP (!) 153/82   Pulse (!) 108   Temp 98.1 F (36.7 C)   Resp (!) 21   Ht 5\' 7"  (1.702 m)   Wt 40.8 kg (89 lb 14.4 oz)   SpO2 100%   BMI 14.08 kg/m   FIO2 =  5 liters    INTAKE / OUTPUT:  Intake/Output Summary (Last 24 hours) at 03/01/17 1103 Last data filed at 03/01/17 0707  Gross per 24 hour  Intake              200 ml  Output              350 ml  Net             -150 ml     PHYSICAL EXAMINATION Physical Exam  Constitutional: He is oriented to person, place, and time. He appears well-developed and well-nourished.  HENT:  Head: Normocephalic and atraumatic.  Eyes: Pupils are equal, round, and reactive to light. Conjunctivae and EOM are normal.  Neck: Normal range of motion. Neck supple.  Cardiovascular: Normal rate, regular rhythm and normal heart sounds.   Pulmonary/Chest: Accessory muscle usage present. No  respiratory distress. He has no wheezes. He has rhonchi.  Abdominal: Soft. Normal appearance and bowel sounds are normal. There is no tenderness.  Musculoskeletal: Normal range of motion. He exhibits no edema.  Neurological: He is alert and oriented to person, place, and time.  Skin: Skin is warm, dry and intact. Bruising and ecchymosis noted.  Psychiatric: He is withdrawn. He exhibits a depressed mood.    LABS:  BMET  Recent Labs Lab 02/23/17 0243 02/27/17 0013 02/28/17 0350 03/01/17 0338  NA 146* 146*  --   --   K 5.1 4.4  --   --   CL 113* 115*  --   --   CO2 25 25  --   --   BUN 59* 34*  --   --   CREATININE 1.68* 1.19 1.12 1.09  GLUCOSE 163* 150*  --   --     Electrolytes  Recent Labs Lab 02/23/17 0243 02/27/17 0013  CALCIUM 7.8* 7.9*    CBC  Recent Labs Lab 02/24/17 0430 02/25/17 0416 02/27/17 0013  WBC 5.1 7.0 5.5  HGB 8.7* 8.4* 7.7*  HCT 28.6* 27.6* 25.2*  PLT 114* 99* 69*    Coag's No results for input(s): APTT, INR in the last 168 hours.  Sepsis Markers No results for input(s): LATICACIDVEN, PROCALCITON, O2SATVEN in the last 168 hours.  ABG No results for input(s): PHART, PCO2ART, PO2ART in the last 168 hours.  Liver Enzymes No results for input(s): AST, ALT, ALKPHOS, BILITOT, ALBUMIN in the last 168 hours.  Cardiac Enzymes No results for input(s): TROPONINI, PROBNP in the last 168 hours.  Glucose No results for input(s): GLUCAP in the last 168 hours.    STUDIES:   ANCA titers 7/22>>>  CULTURES: Sputum  7/21 >normal flora   ANTIBIOTICS: Levaquin 7/16 (aeBronchiectasis) >>> changed to vanc and cefepime 7/23    ASSESSMENT / PLAN:  81 year old male with end stage bronchiectasis at this point presenting with acute on chronic respiratory failure.  On exam, diffuse rhonchi noted. I reviewed CXR myself, infiltrate noted.  Discussed with PCCM-NP.    Hypoxemia:  - Titrate O2 for sat of 88-92%  - May need to arrange for home O2  prior to discharge  Acute on chronic respiratory failure:  - Flutter valve  - IS per RT protocol  - O2 as above  Bronchiectasis  - Abx as ordered  - Add mucinex  - Cultures are negative (normal flora)  - Continue hypertonic saline nebs to BID to assist with mucous clearance  - No vibravest (fractures)  Goals of care discussion:  - Palliative care to see patient at 2 PM today.  - Need to continue discussions regarding code status.  PCCM will sign off, please call back if needed.  Rush Farmer, M.D. [facility] Pulmonary/Critical Care Medicine. Pager: [phone]. After hours pager: [phone].

## 2017-03-01 NOTE — Progress Notes (Signed)
PROGRESS NOTE  Patient seen on 02/24/17 note entered next day.  Richard [name]  NWG:[id number] DOB: 1936/04/19 DOA: 03/06/2017 PCP: [name], MD    Brief Narrative:  81 y.o. male with medical history significant of Wegener's granulomatosis, PUD and esophagitis, hypertension, hiatal hernia, diverticulosis, COPD, BPH, CAD status post cardiac catheterization and stent placement. Patient presented with multiple generalized and focal complaints all likely related to cardiopulmonary disease.   Patient is currently being treated for acute on chronic hypoxic respiratory failure presumed to be secondary to COPD exacerbation, pt is currently on antibiotics after chest x ray reported worsening in pna. Transitioned from levaquin to more broad spectrum abx's. Pulmonology mentions that patient has end-stage bronchiectasis and given his multiple medical problems we should lean more towards palliative care. The patient also complained of difficulty swallowing and had both ENT and GI evaluation. Please see discussions below. Unfortunately patient is not candidate for aggressive therapy based on his respiratory status. Palliative consult has been placed and is currently assisting with goals of care  Assessment & Plan:     Acute on chronic respiratory failure [facility] - Patient has a complicated respiratory/pulmonary history.  - Pulmonologist consulted. - Mild Improvement on Levaquin, Dulera. Continue supportive therapy for symptom management with Tessalon and Hycodan. - Patient currently on Solu-Medrol  - I have broadened antibiotic coverage given worsening infiltrate on x-ray. Pulmonology team is agreeable. Cultures are negative reporting normal flora. Patient has end-stage bronchiectasis. I spent more than 30 minutes with patient and family updating them about prognosis and limited medical options given his pulmonary disease and the progressive nature of his Wegener's disease.   Dysphagia -Patient was  seen by ENT specialist who recommended speech therapy and medical treatment. No surgery found. 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 - Gastroenterologist has seen patient in the past and patient has undergone esophageal dilatation. Unfortunately given respiratory condition they would not be able to do any dilatation - Palliative care on board and assisting with [place].  Active Problems:   Wegener's granulomatosis ([place]) - Cytotoxin discontinued in lieu of active infection    Chronic kidney disease, stage IV (severe) (HCC) - Serum creatinine stable    Protein-calorie malnutrition, severe (HCC) - Agree with continuing ensure - Most likely due to dysphagia.     Hypotension - Resolved    Chest pressure - None reported today troponin negative 3    FTT (failure to thrive) in adult - Suspect combination of principal problem and protein calorie malnutrition.    Pressure injury of skin   PAF (paroxysmal atrial fibrillation) ([place]) - Patient is currently rate controlled on metoprolol and is currently on aspirin. Suspect patient is not a anticoagulant candidate despite  Chadvasc2 score is more than 2, due to generalized weakness and increased risk of falls.   DVT prophylaxis: Heparin Code Status: Full Family Communication: discussed with daughter and son at bedside Disposition Plan: Pending palliative evaluation and goals of care delineation   Consultants:  GI Pulmonology ENT Palliative care   Procedures: none   Antimicrobials: Levaquin   Subjective: No new complaints reported today. Daughter wanted pulse ox continues based on patient. Otherwise the patient does not report any worsening in shortness of breath.  Objective: Vitals:   03/01/17 0842 03/01/17 0911 03/01/17 1358 03/01/17 1741  BP:  (!) 153/82 (!) 147/79   Pulse:  (!) 108 99   Resp:   20   Temp:   98.7 F (37.1 C)   TempSrc:  Oral   SpO2: 100%  100% 100%  Weight:       Height:        Intake/Output Summary (Last 24 hours) at 03/01/17 1820 Last data filed at 03/01/17 1509  Gross per 24 hour  Intake             1408 ml  Output              525 ml  Net              883 ml   Filed Weights   02/05/2017 1021 02/12/2017 1643  Weight: 43.1 kg (95 lb) 40.8 kg (89 lb 14.4 oz)    Examination: Exam unchanged when compared to 02/28/2017  General exam: Awake and alert, in no acute distress Respiratory system: Equal chest rise, no cyanosis, inaudible wheezes, nasal cannula in place Cardiovascular system: irregularly irregular,  + murmur no rubs or gallops  Gastrointestinal system: Abdomen is nondistended, soft and nontender. No organomegaly or masses felt. Normal bowel sounds heard. Central nervous system: Alert and oriented. No focal neurological deficits. Extremities: Warm and dry  Skin: No rashes, lesions or ulcers, on limited exam  Psychiatry:  Patient has flat affect  Data Reviewed: I have personally reviewed following labs and imaging studies  CBC:  Recent Labs Lab 02/23/17 0243 02/24/17 0430 02/25/17 0416 02/27/17 0013  WBC 4.3 5.1 7.0 5.5  HGB 8.8* 8.7* 8.4* 7.7*  HCT 28.4* 28.6* 27.6* 25.2*  MCV 111.8* 113.0* 112.7* 112.5*  PLT 123* 114* 99* 69*   Basic Metabolic Panel:  Recent Labs Lab 02/23/17 0243 02/27/17 0013 02/28/17 0350 03/01/17 0338  NA 146* 146*  --   --   K 5.1 4.4  --   --   CL 113* 115*  --   --   CO2 25 25  --   --   GLUCOSE 163* 150*  --   --   BUN 59* 34*  --   --   CREATININE 1.68* 1.19 1.12 1.09  CALCIUM 7.8* 7.9*  --   --    GFR: Estimated Creatinine Clearance: 32.2 mL/min (by C-G formula based on SCr of 1.09 mg/dL). Liver Function Tests: No results for input(s): AST, ALT, ALKPHOS, BILITOT, PROT, ALBUMIN in the last 168 hours. No results for input(s): LIPASE, AMYLASE in the last 168 hours. No results for input(s): AMMONIA in the last 168 hours. Coagulation Profile: No results for input(s): INR, PROTIME  in the last 168 hours. Cardiac Enzymes: No results for input(s): CKTOTAL, CKMB, CKMBINDEX, TROPONINI in the last 168 hours. BNP (last 3 results) No results for input(s): PROBNP in the last 8760 hours. HbA1C: No results for input(s): HGBA1C in the last 72 hours. CBG: No results for input(s): GLUCAP in the last 168 hours. Lipid Profile: No results for input(s): CHOL, HDL, LDLCALC, TRIG, CHOLHDL, LDLDIRECT in the last 72 hours. Thyroid Function Tests: No results for input(s): TSH, T4TOTAL, FREET4, T3FREE, THYROIDAB in the last 72 hours. Anemia Panel: No results for input(s): VITAMINB12, FOLATE, FERRITIN, TIBC, IRON, RETICCTPCT in the last 72 hours. Sepsis Labs: No results for input(s): PROCALCITON, LATICACIDVEN in the last 168 hours.  Recent Results (from the past 240 hour(s))  Culture, respiratory (NON-Expectorated)     Status: None   Collection Time: 02/25/17  4:03 PM  Result Value Ref Range Status   Specimen Description INDUCED SPUTUM  Final   Special Requests NONE  Final   Gram Stain   Final    ABUNDANT  WBC PRESENT,BOTH PMN AND MONONUCLEAR FEW SQUAMOUS EPITHELIAL CELLS PRESENT MODERATE GRAM NEGATIVE RODS MODERATE GRAM POSITIVE COCCI IN CHAINS IN CLUSTERS FEW GRAM POSITIVE RODS RARE BUDDING YEAST SEEN    Culture Consistent with normal respiratory flora.  Final   Report Status 02/28/2017 FINAL  Final         Radiology Studies: No results found.   Scheduled Meds: . arformoterol  15 mcg Nebulization BID  . aspirin EC  81 mg Oral Daily  . budesonide (PULMICORT) nebulizer solution  0.25 mg Nebulization BID  . calcitRIOL  0.25 mcg Oral Daily  . famotidine  20 mg Oral QHS  . feeding supplement  1 Container Oral TID BM  . finasteride  5 mg Oral Daily  . heparin  5,000 Units Subcutaneous Q8H  . mouth rinse  15 mL Mouth Rinse BID  . methylPREDNISolone (SOLU-MEDROL) injection  60 mg Intravenous Daily  . metoprolol succinate  25 mg Oral Daily  . pantoprazole  40 mg Oral  QAC breakfast  . PARoxetine  20 mg Oral Daily  . psyllium  1 packet Oral Daily  . senna  1 tablet Oral BID  . simvastatin  20 mg Oral q1800  . sodium bicarbonate  1,300 mg Oral BID  . sodium chloride HYPERTONIC  4 mL Nebulization BID  . tamsulosin  0.4 mg Oral Daily   Continuous Infusions: . ceFEPime (MAXIPIME) IV Stopped (03/01/17 1327)  . vancomycin Stopped (03/01/17 1717)     LOS: 8 days   Time spent: > 20 minutes  Velvet Bathe, MD [facility] Pager [phone]  If 7PM-7AM, please contact night-coverage [email or web address] Password [email or web address] 03/01/2017, 6:20 PM

## 2017-03-01 NOTE — Progress Notes (Signed)
Daily Progress Note   Patient Name: Richard [name]       Date: 03/01/2017 DOB: 09/03/1935  Age: 81 y.o. MRN#: [id number] Attending Physician: Velvet Bathe, MD Primary Care Physician: [name], MD Admit Date: 02/15/2017  Reason for Consultation/Follow-up: Establishing goals of care, Non pain symptom management and Psychosocial/spiritual support  Subjective: Richard [name] was more awake and interactive today. He feels there have been no significant changes in his breathing, though he does feel a little more awake today compared to prior days. He continues to endorse generalized weakness and minimal appetite. His family was present for a family meeting, so everyone was present at the bedside.   Length of Stay: 8  Current Medications: Scheduled Meds:  . arformoterol  15 mcg Nebulization BID  . aspirin EC  81 mg Oral Daily  . bisacodyl  10 mg Rectal Once  . budesonide (PULMICORT) nebulizer solution  0.25 mg Nebulization BID  . calcitRIOL  0.25 mcg Oral Daily  . famotidine  20 mg Oral QHS  . feeding supplement  1 Container Oral TID BM  . finasteride  5 mg Oral Daily  . heparin  5,000 Units Subcutaneous Q8H  . mouth rinse  15 mL Mouth Rinse BID  . methylPREDNISolone (SOLU-MEDROL) injection  60 mg Intravenous Daily  . metoprolol succinate  25 mg Oral Daily  . pantoprazole  40 mg Oral QAC breakfast  . PARoxetine  20 mg Oral Daily  . psyllium  1 packet Oral Daily  . senna  1 tablet Oral BID  . simvastatin  20 mg Oral q1800  . sodium bicarbonate  1,300 mg Oral BID  . sodium chloride HYPERTONIC  4 mL Nebulization BID  . tamsulosin  0.4 mg Oral Daily    Continuous Infusions: . ceFEPime (MAXIPIME) IV Stopped (03/01/17 1327)  . vancomycin Stopped (02/28/17 1514)    PRN Meds: acetaminophen **OR** acetaminophen, albuterol,  benzonatate, gi cocktail, guaiFENesin, HYDROcodone-homatropine, lidocaine, lidocaine, lidocaine-EPINEPHrine, nitroGLYCERIN, ondansetron **OR** ondansetron (ZOFRAN) IV, [DISCONTINUED] oxyCODONE-acetaminophen **AND** oxyCODONE, oxymetazoline, silver nitrate applicators, simethicone, TRIPLE ANTIBIOTIC  Physical Exam    Constitutional: He is oriented to person, place, and time. He appears cachectic. He has a sickly appearance. Nasal cannula in place.  HENT:  Head: Normocephalic and atraumatic.  Mouth/Throat: Oropharynx is clear and moist. Mucous membranes are dry. No oropharyngeal exudate.  Eyes: EOM are normal.  Neck: Normal range of motion. Neck supple.  Cardiovascular: Normal rate.  An irregularly irregular rhythm present.  Murmur heard. Pulmonary/Chest: Accessory muscle usage present. He has decreased breath sounds in the right lower field and the left lower field. He has wheezes (expiratory) in the right upper field and the left upper field. He has rhonchi (scattered).  Abdominal: Soft. Bowel sounds are decreased.  No abdominal tenderness. Musculoskeletal: Normal range of motion. He exhibits no edema.  Neurological: He is alert and oriented to person, place, and time.  Skin: Skin is warm and dry. Bruising noted. There is pallor.  Psychiatric: Judgment and thought content normal. His speech is delayed. He is withdrawn. Cognition and memory are normal.  Flat affect, although slightly more interactive today with his family present.         Vital  Signs: BP (!) 147/79 (BP Location: Left Arm)   Pulse 99   Temp 98.1 F (36.7 C)   Resp 20   Ht _0  (1.702 m)   Wt 40.8 kg (89 lb 14.4 oz)   SpO2 100%   BMI 14.08 kg/m  SpO2: SpO2: 100 % O2 Device: O2 Device: Not Delivered O2 Flow Rate: O2 Flow Rate (L/min): 4 L/min  Intake/output summary:  Intake/Output Summary (Last 24 hours) at 03/01/17 1542 Last data filed at 03/01/17 1509  Gross per 24 hour  Intake               50 ml  Output               350 ml  Net             -300 ml   LBM: Last BM Date: 02/19/17 (not eating) Baseline Weight: Weight: 43.1 kg (95 lb) Most recent weight: Weight: 40.8 kg (89 lb 14.4 oz)       Palliative Assessment/Data: PPS 30-40%   Flowsheet Rows     Most Recent Value  Intake Tab  Referral Department  Hospitalist  Unit at Time of Referral  Med/Surg Unit  Palliative Care Primary Diagnosis  Pulmonary  Date Notified  02/27/17  Palliative Care Type  New Palliative care  Reason for referral  Clarify Goals of Care  Date of Admission  02/26/2017  Date first seen by Palliative Care  02/28/17  # of days Palliative referral response time  1 Day(s)  # of days IP prior to Palliative referral  6  Clinical Assessment  Psychosocial & Spiritual Assessment  Palliative Care Outcomes      Patient Active Problem List   Diagnosis Date Noted  . Goals of care, counseling/discussion   . Pressure injury of skin 02/22/2017  . PAF (paroxysmal atrial fibrillation) ([place]) 02/22/2017  . Acute respiratory failure with hypoxia ([place]) 03/02/2017  . Chest pressure 03/03/2017  . FTT (failure to thrive) in adult 02/27/2017  . Anemia, chronic disease   . Dehydration   . HCAP (healthcare-associated pneumonia)   . Sepsis ([place]) 01/05/2017  . Lobar pneumonia ([place]) 01/05/2017  . Compression fracture of thoracic spine, non-traumatic ([place]) 01/05/2017  . AKI (acute kidney injury) ([place]) 01/05/2017  . Hypotension 01/05/2017  . CAD in native artery 11/14/2016  . Melena 11/01/2016  . Malnutrition of moderate degree 08/22/2016  . Influenza A 08/21/2016  . AVM (arteriovenous malformation) of colon with hemorrhage   . Symptomatic anemia 07/30/2016  . Gastrointestinal hemorrhage with melena 07/30/2016  . Aortic valve stenosis 07/08/2016  . Protein-calorie malnutrition, severe ([place]) 04/21/2015  . CAP (community acquired pneumonia) 04/18/2015  . Chronic kidney disease, stage IV (severe) ([place]) 03/26/2015  . GERD  (gastroesophageal reflux disease) 08/20/2014  . Wegener's granulomatosis with renal involvement ([place]) 07/15/2014  . Pseudoaneurysm of right femoral artery ([place]) 04/08/2014  . Acute blood loss anemia 03/20/2014  . CAD- severe 4 V CAD- not CABG candidate: BMS-pLAD, mRCA &PTCA of 100% Cx-OM 03/17/2014  . Anemia 03/17/2014  . CKD (chronic kidney disease), stage III 03/17/2014  . Cardiomyopathy, ischemic- EF 40% by [facility] 03/17/2014  . Dyspnea 03/13/2014  . Abnormal nuclear stress test: Intermediate risk with moderate region of apical and inferior scar with mild superimposed ischemia; EF 40% 03/12/2014  . Loss of weight 12/12/2013  . Abdominal pain, unspecified site 12/12/2013  . SVT (supraventricular tachycardia) ([place]) 10/04/2013  . SOB (shortness of breath) 10/04/2013  . Weakness generalized 10/04/2013  .  Hypertension 09/28/2013  . Chronic rhinitis 09/27/2013  . Chronic respiratory failure ([place]) 12/26/2012  . Bronchiectasis with acute exacerbation ([place]) 09/28/2011  . Dysphagia 10/25/2010  . Wegener's granulomatosis ([place]) 05/10/2010  . GERD 03/10/2010  . ARTHRITIS, CERVICAL SPINE 03/10/2010  . Hypothyroidism 11/13/2008  . VITAMIN D DEFICIENCY 11/13/2008  . Hyperlipidemia with target low density lipoprotein (LDL) cholesterol less than 70 mg/dL 11/13/2008  . ANEMIA 11/13/2008  . BPH (benign prostatic hyperplasia) 12/04/2007  . Obstructive bronchiectasis ([place]) 01/25/2007    Palliative Care Assessment & Plan   HPI: 81 y.o. male  with past medical history of obstructive bronchiectasis then diagnosed with Wegener's Granulomatosis with renal and pulmonary involvement (initial diagnosis 2011), COPD, CAD s/p cardiac cath and stent placement, HTN, dysphagia, PUD, and esophagitis. Pt presented to the ED with multiple medical issues, which included worsening dyspnea on exertion, chest pain, and appetite loss with progressive weakness and fatigue. He was seen by Pulmonology one day prior to  admission. He was admitted on 03/01/2017 for acute on chronic hypoxic respiratory failure secondary to acute exacerbation of obstructive bronchiectasis, which is complicated by FTT from chronic dysphagia and poor oral intake. GI consulted for dysphagia and identified a cricopharyngeal dysfunction with global GI dysmotility; they have little to offer to help with this and are suggesting Palliative. Pulmonology also following but are concerned that he is approaching the end of his life. Per GI notes: "He has then been in a progressive cycle of pain [thoracic spine fx 2 mo ago], poor nutrition, recurrent pulmonary infection, weakness and greater difficulty swallowing."   Assessment: I met with Richard [name] today at his bedside in the presence of his family (wife, son, two daughters, son-in-law, and granddaughter). We talked through his health issues. Initially Richard [name] was vague about his health issues, but with closer questioning was able to elaborate on his pulmonary issues, swallowing issues, and his overall decline in health. With the input from his family he detailed his sharp decline starting about two months ago when he had his back injury. From that point he has experienced decreased appetite, increased weakness, and progressive respiratory issues despite ED and Pulmonology visits.   As he talked about his declining health he was clear that things had been getting worse: treatments from Pulmonology had not been helping, and he wasn't noticing a significant difference from ED and hospital visits. His family also agreed with these sentiments. That said, when I queried his expectations for the future given this clear decline, he still was hopeful that things could turn around and he could return to his prior level of health (three months ago he was independent and cutting the grass with his son). He wanted to continue aggressive care, to include hospitalizations as needed and interventions available, in the  hopes to improve his situation.   I validated his goal and hopes, but did express my strong concern that he was physically very weak and, even in the best case scenario, I would expect recurrent infections and repeat hospitalizations. I also emphasized the bad cycle he was in with not eating, weakness, infections, more weakness, swallowing issues, etc. There are things that the hospital can provide, such as antibiotics, IV steroids, etc., but he has to start eating, drinking, and moving again. He recognized his role in improving, and did verbally affirm his commitment to doing what he could to improve. I also restated the value of continuing to think about how and where he wants to spend his time, for whatever  he has left. There may come a point when things are not improving with this current approach, and he can always choose to change his focus to spending his time supported at home with Hospice. He acknowledged this, but did not want to discuss this possibility further.   Finally, we did talk about code status. I detailed the options for when his heart and/or lungs shut down. With minimal contemplation, he expressed a clear desire not to have aggressive interventions to keep him alive, and would rather want to be allowed to die a natural death. I restated this back to him and confirmed that he did not want CPR, defibrillation, or intubation. His family was all present to hear his wishes.   Recommendations/Plan:  DNR, but otherwise full scope treatment  Poor oral intake: He is already being followed by a Dietician. Prior appetite supplement (Marinol) did not seem to be helping, and he had increased lethargy. I've ordered an ECG to assess QTc to help guide appetite stimulant choice.   Goals of Care and Additional Recommendations:  Limitations on Scope of Treatment: Full Scope Treatment  Code Status:  DNR  Prognosis:   < 6 months  Discharge Planning:  To Be Determined  Care plan was  discussed with pt, extensive family, care nurse.  Thank you for allowing the Palliative Medicine Team to assist in the care of this patient.  Time in: 1400 Time out: 1600 Total time: 150 minutes    Greater than 50%  of this time was spent counseling and coordinating care related to the above assessment and plan.  Charlynn Court, NP Palliative Medicine Team [phone] pager (7a-5p) Team Phone # [phone]

## 2017-03-01 NOTE — Progress Notes (Signed)
Asked MD about labs that have not been drawn since 02/27/17. Provider put in order for labs to be drawn 03/02/17

## 2017-03-02 DIAGNOSIS — Z515 Encounter for palliative care: Secondary | ICD-10-CM

## 2017-03-02 LAB — CBC
HEMATOCRIT: 23.9 % — AB (ref 39.0–52.0)
Hemoglobin: 7.5 g/dL — ABNORMAL LOW (ref 13.0–17.0)
MCH: 34.2 pg — ABNORMAL HIGH (ref 26.0–34.0)
MCHC: 31.4 g/dL (ref 30.0–36.0)
MCV: 109.1 fL — AB (ref 78.0–100.0)
Platelets: 57 10*3/uL — ABNORMAL LOW (ref 150–400)
RBC: 2.19 MIL/uL — AB (ref 4.22–5.81)
RDW: 16 % — ABNORMAL HIGH (ref 11.5–15.5)
WBC: 6.6 10*3/uL (ref 4.0–10.5)

## 2017-03-02 LAB — VANCOMYCIN, TROUGH: Vancomycin Tr: 11 ug/mL — ABNORMAL LOW (ref 15–20)

## 2017-03-02 LAB — CREATININE, SERUM
Creatinine, Ser: 1.02 mg/dL (ref 0.61–1.24)
GFR calc Af Amer: >60 mL/min (ref >60)
GFR calc non Af Amer: >60 mL/min (ref >60)

## 2017-03-02 MED ORDER — VANCOMYCIN HCL IN DEXTROSE 1-5 GM/200ML-% IV SOLN
1000.0000 mg | INTRAVENOUS | Status: DC
  Administered 2017-03-02 – 2017-03-06 (×5): 1000 mg via INTRAVENOUS
  Filled 2017-03-02 (×6): qty 200

## 2017-03-02 MED ORDER — OLANZAPINE 5 MG PO TBDP
2.5000 mg | ORAL_TABLET | Freq: Every day | ORAL | Status: DC
  Administered 2017-03-02: 2.5 mg via ORAL
  Filled 2017-03-02 (×2): qty 0.5

## 2017-03-02 NOTE — Progress Notes (Signed)
Pharmacy Antibiotic Note  Richard [name] is a 81 y.o. male admitted on 03/01/2017 with pneumonia.  Pharmacy has been consulted for vancomycin and cefepime dosing.  Vancomycin trough was subtherapeutic at 11, drawn ~20 hours after previous dose therefore true trough likely below 11. Patient remains afebrile, noted poor clinical status in setting of end-stage bronchiectasis. Palliative following.   Plan: Increase Vancomycin to 1000 mg IV every 24 hours.  Goal trough 15-20 mcg/mL. Continue cefepime 1 gram q 24 Monitor renal function and clinical status  Height: 5\' 7"  (170.2 cm) Weight: 89 lb 14.4 oz (40.8 kg) IBW/kg (Calculated) : 66.1  Temp (24hrs), Avg:98.4 F (36.9 C), Min:98.3 F (36.8 C), Max:98.4 F (36.9 C)   Recent Labs Lab 02/24/17 0430 02/25/17 0416 02/27/17 0013 02/28/17 0350 03/01/17 0338 03/02/17 0312 03/02/17 1221  WBC 5.1 7.0 5.5  --   --  6.6  --   CREATININE  --   --  1.19 1.12 1.09 1.02  --   VANCOTROUGH  --   --   --   --   --   --  11*    Estimated Creatinine Clearance: 34.4 mL/min (by C-G formula based on SCr of 1.02 mg/dL).    No Known Allergies  Antimicrobials this admission: Vanc 07/23>> Cefepime 07/23>> Levaquin 07/17>>07/23  Dose adjustments this admission: Increase vancomycin from 500 mg q 24hr to 1000 mg q 24hr  Microbiology results: 7/21 Resp cx: Consistent with normal respiratory flora   Thank you for allowing pharmacy to be a part of this patient's care.  Charlene Brooke, PharmD PGY1 [place] Resident Pager: [phone] 03/02/2017 2:30 PM

## 2017-03-02 NOTE — Progress Notes (Signed)
Patient ID: Richard [name], male   DOB: Mar 25, 1936, 81 y.o.   MRN: [id number]  PROGRESS NOTE    Richard [name]  CZY:[id number] DOB: [birth year] DOA: 02/22/2017  PCP: [name], MD   Brief Narrative:  81 year old male with Wegener's granulomatosis, PUD, esophagitis, hypertension, COPD< CAD and stent placement who presented to ED with shortness of breath, cough and also difficulty swallowing. Pt had GI and ENT evaluation. He is not a candidate for aggressive therapy based on his poor resp status. Palliative was consulted for goals of care.   Assessment & Plan:   Active Problems:   Acute respiratory failure with hypoxia (HCC) / Wegener's granulomatosis ([place]) / Obstructive bronchiectasis (HCC) / Aspiration pneumonitis  - Per pulmonary, pt has end stage bronchiectasis - Palliative consulted for goals of care - Continue Brovana and Pulmicort nebulizer BID - Continue solumedrol 60 mg IV daily - Continue cefepime and vacno     Chronic kidney disease, stage IV (severe) (HCC) - Cr WNL this am    FTT (failure to thrive) in adult / Protein-calorie malnutrition, severe ([place]) - In the context of chronic illness - Seen by SLP - Diet regular    Anemia of chronic disease / Macrocytic anemia / Thrombocytopenia - Platelets 57 this am, stop subQ heparin and use SCD's for DVT prophylaxis  - Hgb 7.5 this am - Transfuse for hgb less than 7    Dyslipidemia - Continue Zocor     Essential hypertension - Continue metoprolol     Depression - Continue Olanzapine and paxil    BPH without urinary retention - Continue Proscar and Flomax     PAF (paroxysmal atrial fibrillation) (HCC) - CHADS vasc score 2 - Continue aspirin - Rate controlled with metoprolol    CAD s/p stent placement - Continue aspirin    Sacral pressure skin injury, stage 1 - Per RN care    DVT prophylaxis: stop hep subQ due to platelet count of 57; use SCD's  Code Status:DNR/DNI Family Communication: son at  bedside  Disposition Plan: home once stable, still has high oxygen requirement   Consultants:   PT  CCM  SLP  Palliative care  ENT  GI  Cardiology   Procedures:   None  Antimicrobials:   Vanco and cefepime    Subjective: On [place] oxygen. No overnight events.   Objective: Vitals:   03/01/17 1741 03/01/17 1923 03/01/17 2146 03/02/17 0545  BP:   125/76 (!) 133/56  Pulse:   (!) 102 100  Resp:   20 20  Temp:   98.4 F (36.9 C) 98.3 F (36.8 C)  TempSrc:      SpO2: 100% 100% 99% 100%  Weight:      Height:        Intake/Output Summary (Last 24 hours) at 03/02/17 0758 Last data filed at 03/02/17 0355  Gross per 24 hour  Intake             1408 ml  Output              275 ml  Net             1133 ml   Filed Weights   02/15/2017 1021 02/17/2017 1643  Weight: 43.1 kg (95 lb) 40.8 kg (89 lb 14.4 oz)    Examination:  General exam: Appears calm and comfortable  Respiratory system: coarse breath sounds, wheezing in upper lung lobes  Cardiovascular system: S1 & S2 heard, Rate controlled  Gastrointestinal  system: Abdomen is nondistended, soft and nontender. No organomegaly or masses felt. Normal bowel sounds heard. Central nervous system: Alert and oriented. No focal neurological deficits. Extremities: Symmetric 5 x 5 power. Skin: No rashes, lesions or ulcers Psychiatry: Judgement and insight appear normal. Mood & affect appropriate.   Data Reviewed: I have personally reviewed following labs and imaging studies  CBC:  Recent Labs Lab 02/24/17 0430 02/25/17 0416 02/27/17 0013 03/02/17 0312  WBC 5.1 7.0 5.5 6.6  HGB 8.7* 8.4* 7.7* 7.5*  HCT 28.6* 27.6* 25.2* 23.9*  MCV 113.0* 112.7* 112.5* 109.1*  PLT 114* 99* 69* 57*   Basic Metabolic Panel:  Recent Labs Lab 02/27/17 0013 02/28/17 0350 03/01/17 0338 03/02/17 0312  NA 146*  --   --   --   K 4.4  --   --   --   CL 115*  --   --   --   CO2 25  --   --   --   GLUCOSE 150*  --   --   --   BUN 34*   --   --   --   CREATININE 1.19 1.12 1.09 1.02  CALCIUM 7.9*  --   --   --    GFR: Estimated Creatinine Clearance: 34.4 mL/min (by C-G formula based on SCr of 1.02 mg/dL). Liver Function Tests: No results for input(s): AST, ALT, ALKPHOS, BILITOT, PROT, ALBUMIN in the last 168 hours. No results for input(s): LIPASE, AMYLASE in the last 168 hours. No results for input(s): AMMONIA in the last 168 hours. Coagulation Profile: No results for input(s): INR, PROTIME in the last 168 hours. Cardiac Enzymes: No results for input(s): CKTOTAL, CKMB, CKMBINDEX, TROPONINI in the last 168 hours. BNP (last 3 results) No results for input(s): PROBNP in the last 8760 hours. HbA1C: No results for input(s): HGBA1C in the last 72 hours. CBG: No results for input(s): GLUCAP in the last 168 hours. Lipid Profile: No results for input(s): CHOL, HDL, LDLCALC, TRIG, CHOLHDL, LDLDIRECT in the last 72 hours. Thyroid Function Tests: No results for input(s): TSH, T4TOTAL, FREET4, T3FREE, THYROIDAB in the last 72 hours. Anemia Panel: No results for input(s): VITAMINB12, FOLATE, FERRITIN, TIBC, IRON, RETICCTPCT in the last 72 hours. Urine analysis:    Component Value Date/Time   COLORURINE YELLOW 02/06/2017 1309   APPEARANCEUR CLEAR 02/10/2017 1309   LABSPEC 1.016 02/08/2017 1309   PHURINE 6.0 02/15/2017 1309   GLUCOSEU NEGATIVE 03/04/2017 1309   GLUCOSEU NEGATIVE 07/26/2013 1142   HGBUR NEGATIVE 02/13/2017 1309   HGBUR trace-intact 11/13/2008 0000   BILIRUBINUR NEGATIVE 02/20/2017 1309   BILIRUBINUR neg 05/16/2012 1300   KETONESUR 5 (A) 03/04/2017 1309   PROTEINUR NEGATIVE 02/11/2017 1309   UROBILINOGEN 0.2 03/25/2014 1637   NITRITE NEGATIVE 02/15/2017 1309   LEUKOCYTESUR NEGATIVE 02/28/2017 1309   Sepsis Labs: @LABRCNTIP (procalcitonin:4,lacticidven:4)   Culture, respiratory (NON-Expectorated)     Status: None   Collection Time: 02/25/17  4:03 PM  Result Value Ref Range Status   Specimen  Description INDUCED SPUTUM  Final   Special Requests NONE  Final   Gram Stain   Final   Culture Normal resp flora.  Final   Report Status 02/28/2017 FINAL  Final      Radiology Studies: Dg Esophagus Result Date: 02/27/2017 Mild narrowing in the proximal cervical esophagus may be related to prominent cricopharyngeus impression. No diverticulum. Large hiatal hernia. Dysmotility, esophageal spasm.   Dg Chest Port 1 View Result Date: 02/26/2017 Multifocal patchy opacities, upper  lobe predominant, progressed from the prior and favoring multifocal pneumonia. Small right pleural effusion, new. Electronically Signed   By: Julian Hy M.D.   On: 02/26/2017 13:43   Dg Swallowing Func-speech Pathology 02/26/2017   Scheduled Meds: . arformoterol  15 mcg Nebulization BID  . aspirin EC  81 mg Oral Daily  . budesonide (PULMICORT) nebulizer solution  0.25 mg Nebulization BID  . calcitRIOL  0.25 mcg Oral Daily  . famotidine  20 mg Oral QHS  . feeding supplement  1 Container Oral TID BM  . finasteride  5 mg Oral Daily  . heparin  5,000 Units Subcutaneous Q8H  . methylPREDNISolo  60 mg Intravenous Daily  . metoprolol succina  25 mg Oral Daily  . OLANZapine zydis  2.5 mg Oral QHS  . pantoprazole  40 mg Oral QAC breakfast  . PARoxetine  20 mg Oral Daily  . psyllium  1 packet Oral Daily  . senna  1 tablet Oral BID  . simvastatin  20 mg Oral q1800  . sodium bicarbonate  1,300 mg Oral BID  . sodium chloride HYPERTONIC  4 mL Nebulization BID  . tamsulosin  0.4 mg Oral Daily   Continuous Infusions: . ceFEPime (MAXIPIME) IV Stopped (03/01/17 1327)  . vancomycin Stopped (03/01/17 1717)     LOS: 9 days    Time spent: 25 minutes Greater than 50% of the time spent on counseling and coordinating the care.   Leisa Lenz, MD [facility] Pager [phone]  If 7PM-7AM, please contact night-coverage [email or web address] Password [facility] 03/02/2017, 7:58 AM

## 2017-03-02 NOTE — Progress Notes (Signed)
OT Cancellation Note  Patient Details Name: Richard [name] MRN: [id number] DOB: 1935/10/09   Cancelled Treatment:    Reason Eval/Treat Not Completed: Patient declined, no reason specified.   Malka So 03/02/2017, 3:42 PM  [phone]

## 2017-03-02 NOTE — Progress Notes (Signed)
Daily Progress Note   Patient Name: Richard [name]       Date: 03/02/2017 DOB: October 12, 1935  Age: 81 y.o. MRN#: [id number] Attending Physician: Robbie Lis, MD Primary Care Physician: [name], MD Admit Date: 02/14/2017  Reason for Consultation/Follow-up: Establishing goals of care, Non pain symptom management and Psychosocial/spiritual support  Subjective: [name] is more lethargic today. He chose not to work with PT, and has only had a few bites of froot loops and a bite of yogurt. He has spent the majority of the day asleep in bed. His daughter is at the bedside and expresses multiple concerns about his waning strength and engagement in self-care.   Length of Stay: 9  Current Medications: Scheduled Meds:  . arformoterol  15 mcg Nebulization BID  . aspirin EC  81 mg Oral Daily  . budesonide (PULMICORT) nebulizer solution  0.25 mg Nebulization BID  . calcitRIOL  0.25 mcg Oral Daily  . famotidine  20 mg Oral QHS  . feeding supplement  1 Container Oral TID BM  . finasteride  5 mg Oral Daily  . mouth rinse  15 mL Mouth Rinse BID  . methylPREDNISolone (SOLU-MEDROL) injection  60 mg Intravenous Daily  . metoprolol succinate  25 mg Oral Daily  . OLANZapine zydis  2.5 mg Oral QHS  . pantoprazole  40 mg Oral QAC breakfast  . PARoxetine  20 mg Oral Daily  . psyllium  1 packet Oral Daily  . senna  1 tablet Oral BID  . simvastatin  20 mg Oral q1800  . sodium bicarbonate  1,300 mg Oral BID  . sodium chloride HYPERTONIC  4 mL Nebulization BID  . tamsulosin  0.4 mg Oral Daily    Continuous Infusions: . ceFEPime (MAXIPIME) IV 1 g (03/02/17 1326)  . vancomycin Stopped (03/01/17 1717)    PRN Meds: acetaminophen **OR** acetaminophen, albuterol, benzonatate, gi cocktail, guaiFENesin, HYDROcodone-homatropine, lidocaine, lidocaine,  lidocaine-EPINEPHrine, nitroGLYCERIN, ondansetron **OR** ondansetron (ZOFRAN) IV, [DISCONTINUED] oxyCODONE-acetaminophen **AND** oxyCODONE, oxymetazoline, silver nitrate applicators, simethicone, TRIPLE ANTIBIOTIC  Physical Exam    Constitutional: He is oriented to person, place, and time. He appears cachectic. He has a sickly appearance. Nasal cannula in place.  HENT:  Head: Normocephalic and atraumatic.  Mouth/Throat: Oropharynx is clear and moist. Mucous membranes are dry. No oropharyngeal exudate.  Eyes: EOM are normal.  Neck: Normal range of motion. Neck supple.  Cardiovascular: Normal rate.  An irregularly irregular rhythm present.  Murmur heard. Pulmonary/Chest: Accessory muscle usage present. He has decreased breath sounds in the right lower field and the left lower field. He has wheezes (expiratory) in the right upper field and the left upper field. He has rhonchi (scattered).  Abdominal: Soft. Bowel sounds are normal. No abdominal tenderness. Musculoskeletal: Normal range of motion. He exhibits no edema.  Neurological: He is oriented to person, place, and time. More lethargic today. Skin: Skin is warm and dry. Bruising noted. There is pallor.  Psychiatric: Judgment and thought content normal. His speech is delayed. He is withdrawn. Cognition and memory are normal.  Flat affect. Lethargic.     Vital Signs: BP 138/60   Pulse 100   Temp 98.3 F (36.8 C)   Resp  20   Ht 5\' 7"  (1.702 m)   Wt 40.8 kg (89 lb 14.4 oz)   SpO2 100%   BMI 14.08 kg/m  SpO2: SpO2: 100 % O2 Device: O2 Device: Nasal Cannula O2 Flow Rate: O2 Flow Rate (L/min): 4 L/min  Intake/output summary:   Intake/Output Summary (Last 24 hours) at 03/02/17 1356 Last data filed at 03/02/17 7619  Gross per 24 hour  Intake             1408 ml  Output              475 ml  Net              933 ml   LBM: Last BM Date: 03/02/17 Baseline Weight: Weight: 43.1 kg (95 lb) Most recent weight: Weight: 40.8 kg (89 lb 14.4  oz)   Palliative Assessment/Data: PPS 30-40%   Flowsheet Rows     Most Recent Value  Intake Tab  Referral Department  Hospitalist  Unit at Time of Referral  Med/Surg Unit  Palliative Care Primary Diagnosis  Pulmonary  Date Notified  02/27/17  Palliative Care Type  New Palliative care  Reason for referral  Clarify Goals of Care  Date of Admission  03/01/2017  Date first seen by Palliative Care  02/28/17  # of days Palliative referral response time  1 Day(s)  # of days IP prior to Palliative referral  6  Clinical Assessment  Psychosocial & Spiritual Assessment  Palliative Care Outcomes      Patient Active Problem List   Diagnosis Date Noted  . Goals of care, counseling/discussion   . Pressure injury of skin 02/22/2017  . PAF (paroxysmal atrial fibrillation) ([place]) 02/22/2017  . Acute respiratory failure with hypoxia ([place]) 02/11/2017  . Chest pressure 02/13/2017  . FTT (failure to thrive) in adult 02/20/2017  . Anemia, chronic disease   . Dehydration   . HCAP (healthcare-associated pneumonia)   . Sepsis ([place]) 01/05/2017  . Lobar pneumonia ([place]) 01/05/2017  . Compression fracture of thoracic spine, non-traumatic ([place]) 01/05/2017  . AKI (acute kidney injury) ([place]) 01/05/2017  . Hypotension 01/05/2017  . CAD in native artery 11/14/2016  . Melena 11/01/2016  . Malnutrition of moderate degree 08/22/2016  . Influenza A 08/21/2016  . AVM (arteriovenous malformation) of colon with hemorrhage   . Symptomatic anemia 07/30/2016  . Gastrointestinal hemorrhage with melena 07/30/2016  . Aortic valve stenosis 07/08/2016  . Protein-calorie malnutrition, severe ([place]) 04/21/2015  . CAP (community acquired pneumonia) 04/18/2015  . Chronic kidney disease, stage IV (severe) ([place]) 03/26/2015  . GERD (gastroesophageal reflux disease) 08/20/2014  . Wegener's granulomatosis with renal involvement ([place]) 07/15/2014  . Pseudoaneurysm of right femoral artery ([place]) 04/08/2014  . Acute blood loss  anemia 03/20/2014  . CAD- severe 4 V CAD- not CABG candidate: BMS-pLAD, mRCA &PTCA of 100% Cx-OM 03/17/2014  . Anemia 03/17/2014  . CKD (chronic kidney disease), stage III 03/17/2014  . Cardiomyopathy, ischemic- EF 40% by [facility] 03/17/2014  . Dyspnea 03/13/2014  . Abnormal nuclear stress test: Intermediate risk with moderate region of apical and inferior scar with mild superimposed ischemia; EF 40% 03/12/2014  . Loss of weight 12/12/2013  . Abdominal pain, unspecified site 12/12/2013  . SVT (supraventricular tachycardia) ([place]) 10/04/2013  . SOB (shortness of breath) 10/04/2013  . Weakness generalized 10/04/2013  . Hypertension 09/28/2013  . Chronic rhinitis 09/27/2013  . Chronic respiratory failure ([place]) 12/26/2012  . Bronchiectasis with acute exacerbation ([place]) 09/28/2011  . Dysphagia 10/25/2010  .  Wegener's granulomatosis ([place]) 05/10/2010  . GERD 03/10/2010  . ARTHRITIS, CERVICAL SPINE 03/10/2010  . Hypothyroidism 11/13/2008  . VITAMIN D DEFICIENCY 11/13/2008  . Hyperlipidemia with target low density lipoprotein (LDL) cholesterol less than 70 mg/dL 11/13/2008  . ANEMIA 11/13/2008  . BPH (benign prostatic hyperplasia) 12/04/2007  . Obstructive bronchiectasis ([place]) 01/25/2007    Palliative Care Assessment & Plan   HPI: 80 y.o. male  with past medical history of obstructive bronchiectasis then diagnosed with Wegener's Granulomatosis with renal and pulmonary involvement (initial diagnosis 2011), COPD, CAD s/p cardiac cath and stent placement, HTN, dysphagia, PUD, and esophagitis. Pt presented to the ED with multiple medical issues, which included worsening dyspnea on exertion, chest pain, and appetite loss with progressive weakness and fatigue. He was seen by Pulmonology one day prior to admission. He was admitted on 03/06/2017 for acute on chronic hypoxic respiratory failure secondary to acute exacerbation of obstructive bronchiectasis, which is complicated by FTT from chronic dysphagia  and poor oral intake. GI consulted for dysphagia and identified a cricopharyngeal dysfunction with global GI dysmotility; they have little to offer to help with this and are suggesting Palliative. Pulmonology also following but are concerned that he is approaching the end of his life. Per GI notes: "He has then been in a progressive cycle of pain [thoracic spine fx 2 mo ago], poor nutrition, recurrent pulmonary infection, weakness and greater difficulty swallowing."   Assessment: In my long meeting with Mr. Cowin and his family on 7/25 we talked through his health issues, explored his perceptions of his overall health trajectory, and discussed care options moving forward. While he did recognize a trending decline in his health, he was nevertheless focused on improving back to his prior level of wellness. To that end, he wanted to continue aggressive care, to include repeat hospitalizations as needed and interventions available. While I validated his goal, I also expressed by strong concern that he was physically very weak, and he would not improve if he wasn't eating. He verbally affirmed his commitment to doing what he could to improve, including focusing on eating (my recommendation was small amounts frequently throughout the day).  Today, I am concerned that he continues to become weaker and, despite his desire to get well, his body is transitioning to a place where he is not able to eat or drink. This is an expected symptom as a body starts to shut down and die. I again provided strong encouragement to try small bites and think about foods or drinks he might like, but I am worried that he will not be able to. I did explain the plan to start another appetite stimulant in an effort to get him eating, which he and his daughter are very hopeful will have an impact.   His daughter and I had a long conversation about the need to start a conversation about considering a transition in care towards comfort. She  is very realistic that he is weak, becoming weaker, and things have not improved since admission. That said, she is terrified that he will "give up and shut down" if Hospice is mentioned. We explored her concerns and I tried to refocus on the issue that he will not succeed at rehab (and has declined PT consistently), and he will not succeed at home unless he has more robust support. He may not be in a place to accept that he is approaching the end of life, but I do think it is important to provide clarity on what  his time will look like if he went home at his current state. As he is very lethargic today, the plan is to start the new appetite stimulant tonight, assses efficacy in the morning, and broach the issue of not eating/drinking again tomorrow.  Recommendations/Plan:  DNR, but otherwise full scope treatment  Start Zyprexa tonight. Will assess efficacy in the morning. I suspect we will need a follow-up conversation about the issue of not eating/drinking. I will start this discussion with him tomorrow.  Goals of Care and Additional Recommendations:  Limitations on Scope of Treatment: Full Scope Treatment  Code Status:  DNR  Prognosis:   < 6 months  Discharge Planning:  To Be Determined  Care plan was discussed with pt, daughter, care nurse.   Thank you for allowing the Palliative Medicine Team to assist in the care of this patient.  Total time: 35 minutes    Greater than 50%  of this time was spent counseling and coordinating care related to the above assessment and plan.  Charlynn Court, NP Palliative Medicine Team [phone] pager (7a-5p) Team Phone # [phone]

## 2017-03-02 NOTE — Progress Notes (Signed)
PT Cancellation Note  Patient Details Name: Richard [name] MRN: [id number] DOB: 02/25/1936   Cancelled Treatment:    Reason Eval/Treat Not Completed: Patient declined, no reason specified. Pt reports that he does not want to do any exercises nor get out bed to recliner. Per RN, pt was a +1 to get to [facility] and +2 to get back to bed from [facility]. Patient has been consistently refusing PT services. Will check back tomorrow as time allows, may sign off as pt may not be appropriate at this time.    Scheryl Marten PT, DPT  [phone]  03/02/2017, 1:17 PM

## 2017-03-03 ENCOUNTER — Telehealth: Payer: Self-pay | Admitting: Family Medicine

## 2017-03-03 DIAGNOSIS — J9610 Chronic respiratory failure, unspecified whether with hypoxia or hypercapnia: Secondary | ICD-10-CM

## 2017-03-03 LAB — CBC
HCT: 26 % — ABNORMAL LOW (ref 39.0–52.0)
Hemoglobin: 8 g/dL — ABNORMAL LOW (ref 13.0–17.0)
MCH: 33.9 pg (ref 26.0–34.0)
MCHC: 30.8 g/dL (ref 30.0–36.0)
MCV: 110.2 fL — ABNORMAL HIGH (ref 78.0–100.0)
PLATELETS: 58 10*3/uL — AB (ref 150–400)
RBC: 2.36 MIL/uL — ABNORMAL LOW (ref 4.22–5.81)
RDW: 15.9 % — AB (ref 11.5–15.5)
WBC: 5.6 10*3/uL (ref 4.0–10.5)

## 2017-03-03 LAB — BASIC METABOLIC PANEL
ANION GAP: 6 (ref 5–15)
BUN: 25 mg/dL — ABNORMAL HIGH (ref 6–20)
CALCIUM: 7.9 mg/dL — AB (ref 8.9–10.3)
CO2: 33 mmol/L — ABNORMAL HIGH (ref 22–32)
Chloride: 110 mmol/L (ref 101–111)
Creatinine, Ser: 0.98 mg/dL (ref 0.61–1.24)
GLUCOSE: 153 mg/dL — AB (ref 65–99)
Potassium: 3.5 mmol/L (ref 3.5–5.1)
SODIUM: 149 mmol/L — AB (ref 135–145)

## 2017-03-03 MED ORDER — GLYCOPYRROLATE 0.2 MG/ML IJ SOLN
0.2000 mg | Freq: Once | INTRAMUSCULAR | Status: AC
  Administered 2017-03-03: 0.2 mg via INTRAVENOUS
  Filled 2017-03-03: qty 1

## 2017-03-03 MED ORDER — OLANZAPINE 5 MG PO TBDP
5.0000 mg | ORAL_TABLET | Freq: Every day | ORAL | Status: DC
  Administered 2017-03-03 – 2017-03-05 (×3): 5 mg via ORAL
  Filled 2017-03-03 (×4): qty 1

## 2017-03-03 NOTE — Telephone Encounter (Signed)
FMLA paperwork to be filled out   -placed in dr's folder -upon completion fax paperwork and call to be picked up.    Fax to: [phone], Attn: Olene Floss -Call Rosann Auerbach for pickup at [phone]

## 2017-03-03 NOTE — Telephone Encounter (Signed)
noted 

## 2017-03-03 NOTE — Progress Notes (Signed)
Daily Progress Note   Patient Name: Richard [name]       Date: 03/03/2017 DOB: [birth year]  Age: 81 y.o. MRN#: [id number] Attending Physician: Robbie Lis, MD Primary Care Physician: [name], MD Admit Date: 02/06/2017  Reason for Consultation/Follow-up: Establishing goals of care, Non pain symptom management and Psychosocial/spiritual support  Subjective: [name] is relatively unchanged from yesterday. He continues to be weak, decline PT/OT, and is not eating. Slightly more congested today. Family remains present and supportive around his bedside.   Length of Stay: 10  Current Medications: Scheduled Meds:  . arformoterol  15 mcg Nebulization BID  . aspirin EC  81 mg Oral Daily  . budesonide (PULMICORT) nebulizer solution  0.25 mg Nebulization BID  . calcitRIOL  0.25 mcg Oral Daily  . famotidine  20 mg Oral QHS  . feeding supplement  1 Container Oral TID BM  . finasteride  5 mg Oral Daily  . mouth rinse  15 mL Mouth Rinse BID  . methylPREDNISolone (SOLU-MEDROL) injection  60 mg Intravenous Daily  . metoprolol succinate  25 mg Oral Daily  . OLANZapine zydis  2.5 mg Oral QHS  . pantoprazole  40 mg Oral QAC breakfast  . PARoxetine  20 mg Oral Daily  . psyllium  1 packet Oral Daily  . senna  1 tablet Oral BID  . simvastatin  20 mg Oral q1800  . sodium bicarbonate  1,300 mg Oral BID  . sodium chloride HYPERTONIC  4 mL Nebulization BID  . tamsulosin  0.4 mg Oral Daily    Continuous Infusions: . ceFEPime (MAXIPIME) IV Stopped (03/03/17 1212)  . vancomycin Stopped (03/02/17 2009)    PRN Meds: acetaminophen **OR** acetaminophen, albuterol, benzonatate, gi cocktail, guaiFENesin, lidocaine, lidocaine, lidocaine-EPINEPHrine, nitroGLYCERIN, ondansetron **OR** ondansetron (ZOFRAN) IV, [DISCONTINUED] oxyCODONE-acetaminophen  **AND** oxyCODONE, oxymetazoline, silver nitrate applicators, simethicone, TRIPLE ANTIBIOTIC  Physical Exam    Constitutional: He is oriented to person, place, and time. He appears cachectic. He has a sickly appearance. Nasal cannula in place.  HENT:  Head: Normocephalic and atraumatic.  Mouth/Throat: Oropharynx is clear and moist. Mucous membranes are dry. No oropharyngeal exudate.  Eyes: EOM are normal.  Neck: Normal range of motion. Neck supple.  Cardiovascular: Normal rate.  An irregularly irregular rhythm present.  Murmur heard. Pulmonary/Chest: Accessory muscle usage present. He has decreased breath sounds in the right lower field and the left lower field. He has wheezes (expiratory) in the right upper field and the left upper field. He has rhonchi (scattered).  Increased congestion with mild secretion gurgling.  Abdominal: Soft. Bowel sounds are normal. No abdominal tenderness. Musculoskeletal: Normal range of motion. He exhibits no edema.  Neurological: He is oriented to person, place, and time. Lethargic. Skin: Skin is warm and dry. Bruising noted. There is pallor.  Psychiatric: Judgment and thought content normal. His speech is delayed. He is withdrawn. Cognition and memory are normal.  Flat affect. Lethargic.     Vital Signs: BP 138/90   Pulse (!) 112   Temp 97.8 F (36.6 C) (Oral)   Resp 20   Ht 5\' 7"  (1.702 m)   Wt 40.8 kg (89 lb 14.4 oz)   SpO2 100%  BMI 14.08 kg/m  SpO2: SpO2: 100 % O2 Device: O2 Device: Nasal Cannula O2 Flow Rate: O2 Flow Rate (L/min): 92 L/min  Intake/output summary:   Intake/Output Summary (Last 24 hours) at 03/03/17 1413 Last data filed at 03/03/17 0236  Gross per 24 hour  Intake              170 ml  Output              350 ml  Net             -180 ml   LBM: Last BM Date: 03/03/17 Baseline Weight: Weight: 43.1 kg (95 lb) Most recent weight: Weight: 40.8 kg (89 lb 14.4 oz)   Palliative Assessment/Data: PPS 30-40%   Flowsheet  Rows     Most Recent Value  Intake Tab  Referral Department  Hospitalist  Unit at Time of Referral  Med/Surg Unit  Palliative Care Primary Diagnosis  Pulmonary  Date Notified  02/27/17  Palliative Care Type  New Palliative care  Reason for referral  Clarify Goals of Care  Date of Admission  03/06/2017  Date first seen by Palliative Care  02/28/17  # of days Palliative referral response time  1 Day(s)  # of days IP prior to Palliative referral  6  Clinical Assessment  Psychosocial & Spiritual Assessment  Palliative Care Outcomes      Patient Active Problem List   Diagnosis Date Noted  . Palliative care by specialist   . Goals of care, counseling/discussion   . Pressure injury of skin 02/22/2017  . PAF (paroxysmal atrial fibrillation) ([place]) 02/22/2017  . Acute respiratory failure with hypoxia ([place]) 02/08/2017  . Chest pressure 03/06/2017  . FTT (failure to thrive) in adult 02/12/2017  . Anemia, chronic disease   . Dehydration   . HCAP (healthcare-associated pneumonia)   . Sepsis ([place]) 01/05/2017  . Lobar pneumonia ([place]) 01/05/2017  . Compression fracture of thoracic spine, non-traumatic ([place]) 01/05/2017  . AKI (acute kidney injury) ([place]) 01/05/2017  . Hypotension 01/05/2017  . CAD in native artery 11/14/2016  . Melena 11/01/2016  . Malnutrition of moderate degree 08/22/2016  . Influenza A 08/21/2016  . AVM (arteriovenous malformation) of colon with hemorrhage   . Symptomatic anemia 07/30/2016  . Gastrointestinal hemorrhage with melena 07/30/2016  . Aortic valve stenosis 07/08/2016  . Protein-calorie malnutrition, severe ([place]) 04/21/2015  . CAP (community acquired pneumonia) 04/18/2015  . Chronic kidney disease, stage IV (severe) ([place]) 03/26/2015  . GERD (gastroesophageal reflux disease) 08/20/2014  . Wegener's granulomatosis with renal involvement ([place]) 07/15/2014  . Pseudoaneurysm of right femoral artery ([place]) 04/08/2014  . Acute blood loss anemia 03/20/2014  . CAD-  severe 4 V CAD- not CABG candidate: BMS-pLAD, mRCA &PTCA of 100% Cx-OM 03/17/2014  . Anemia 03/17/2014  . CKD (chronic kidney disease), stage III 03/17/2014  . Cardiomyopathy, ischemic- EF 40% by [facility] 03/17/2014  . Dyspnea 03/13/2014  . Abnormal nuclear stress test: Intermediate risk with moderate region of apical and inferior scar with mild superimposed ischemia; EF 40% 03/12/2014  . Loss of weight 12/12/2013  . Abdominal pain, unspecified site 12/12/2013  . SVT (supraventricular tachycardia) ([place]) 10/04/2013  . SOB (shortness of breath) 10/04/2013  . Weakness generalized 10/04/2013  . Hypertension 09/28/2013  . Chronic rhinitis 09/27/2013  . Chronic respiratory failure ([place]) 12/26/2012  . Bronchiectasis with acute exacerbation ([place]) 09/28/2011  . Dysphagia 10/25/2010  . Wegener's granulomatosis ([place]) 05/10/2010  . GERD 03/10/2010  . ARTHRITIS, CERVICAL SPINE 03/10/2010  .  Hypothyroidism 11/13/2008  . VITAMIN D DEFICIENCY 11/13/2008  . Hyperlipidemia with target low density lipoprotein (LDL) cholesterol less than 70 mg/dL 11/13/2008  . ANEMIA 11/13/2008  . BPH (benign prostatic hyperplasia) 12/04/2007  . Obstructive bronchiectasis ([place]) 01/25/2007    Palliative Care Assessment & Plan   HPI: 81 y.o. male  with past medical history of obstructive bronchiectasis then diagnosed with Wegener's Granulomatosis with renal and pulmonary involvement (initial diagnosis 2011), COPD, CAD s/p cardiac cath and stent placement, HTN, dysphagia, PUD, and esophagitis. Pt presented to the ED with multiple medical issues, which included worsening dyspnea on exertion, chest pain, and appetite loss with progressive weakness and fatigue. He was seen by Pulmonology one day prior to admission. He was admitted on 02/16/2017 for acute on chronic hypoxic respiratory failure secondary to acute exacerbation of obstructive bronchiectasis, which is complicated by FTT from chronic dysphagia and poor oral intake. GI  consulted for dysphagia and identified a cricopharyngeal dysfunction with global GI dysmotility; they have little to offer to help with this and are suggesting Palliative. Pulmonology also following but are concerned that he is approaching the end of his life. Per GI notes: "He has then been in a progressive cycle of pain [thoracic spine fx 2 mo ago], poor nutrition, recurrent pulmonary infection, weakness and greater difficulty swallowing."   Assessment: In my long meeting with Mr. Carriker and his family on 7/25 we talked through his health issues, explored his perceptions of his overall health trajectory, and discussed care options moving forward. While he did recognize a trending decline in his health, he was nevertheless focused on improving back to his prior level of wellness. To that end, he wanted to continue aggressive care, to include repeat hospitalizations as needed and interventions available. While I validated his goal, I also expressed by strong concern that he was physically very weak, and he would not improve if he wasn't eating. He verbally affirmed his commitment to doing what he could to improve, including focusing on eating (my recommendation was small amounts frequently throughout the day).  In follow-up days I've become increasingly concerned that he is continuing to become weaker and has minimal oral intake despite full supportive care. Olanzapine was started on 7/26, with good tolerance but little effect. I will increase this from 2.5 to 5mg  tonight. I sat down and talked with him about my concerns. He also shared them and was able to express things were not improving despite all the medication. He does recognize that not eating/having no appetite may be a sign that his body is shutting down. I talked through the need to start thinking about where he goes from the hospital, and specifically thinking about how and where he wants to spend his time for whatever he has left. We talked  through home with Hospice, home with home health, and SNF. I recommended home with Hospice given the high level of support he will need, and the value of treating symptoms in place to break the cyclical hospitalizations (which his wife is clearly frustrated by).   Importantly, Mr. Commisso is very worried his provider team is "giving up" on him. I  reassured at length the many things that were being looked at and treated, as well as our ongoing commitment to trying to meet his goals and needs. I also clarified that conversations about goals of care and disposition are intended to understand his wishes for how he spends his time, and how he wants to live his life--for whatever time he  has. These conversation should occur with anyone with serious illness as a way to ensure care is matched to the patient's wants and needs.   Recommendations/Plan:  DNR, but otherwise full scope treatment  Continue Zyprexa, increase to 5mg  qHS.   Palliative will continue to follow to support decision making and disposition plan  Goals of Care and Additional Recommendations:  Limitations on Scope of Treatment: Full Scope Treatment  Code Status:  DNR  Prognosis:   < 6 months  Discharge Planning:  To Be Determined  Care plan was discussed with pt, wife, son, and daughter-in-law  Thank you for allowing the Palliative Medicine Team to assist in the care of this patient.  Total time: 35 minutes    Greater than 50%  of this time was spent counseling and coordinating care related to the above assessment and plan.  Charlynn Court, NP Palliative Medicine Team [phone] pager (7a-5p) Team Phone # [phone]

## 2017-03-03 NOTE — Progress Notes (Signed)
OT Cancellation Note  Patient Details Name: Richard [name] MRN: [id number] DOB: [birth year]   Cancelled Treatment:    Reason Eval/Treat Not Completed: Patient declined, no reason specified;Fatigue/lethargy limiting ability to participate. Pt refused OT on first attempt this morning and but agreed that OT could try again later. OT returned in early afternoon and pt sleeping  [name] 03/03/2017, 12:14 PM

## 2017-03-03 NOTE — Telephone Encounter (Signed)
Pts daughter is calling stating that the pt is back in the hospital in critical care and she will be needing a FMLA for filled out just wanted you all to be on the look out for the paperwork.

## 2017-03-03 NOTE — Progress Notes (Signed)
Patient ID: Richard [name], male   DOB: Dec 26, 1935, 81 y.o.   MRN: [id number]  PROGRESS NOTE    Richard [name]  BSJ:[id number] DOB: [birth year] DOA: 02/25/2017  PCP: [name], MD   Brief Narrative:  81 year old male with Wegener's granulomatosis, PUD, esophagitis, hypertension, COPD< CAD and stent placement who presented to ED with shortness of breath, cough and also difficulty swallowing. Pt had GI and ENT evaluation. He is not a candidate for aggressive therapy based on his poor resp status. Palliative was consulted for goals of care.   Assessment & Plan:   Active Problems:   Acute respiratory failure with hypoxia (HCC) / Wegener's granulomatosis ([place]) / Obstructive bronchiectasis (HCC) / Aspiration pneumonitis  - Per pulmonary, patient has end-stage bronchiectasis - Pt seems more short of breath this am and has air hunger  - Continue current nebulizer treatments, Brovana and Pulmicort nebulizers twice daily - Continue Solu-Medrol 60 mg IV daily - Continue vancomycin and cefepime - Palliative continuing to follow for goals of care - Continue oxygen support via nasal cannula to keep oxygen saturation above 90%    Chronic kidney disease, stage IV (severe) (HCC) - Creatinine is within normal limits    FTT (failure to thrive) in adult / Protein-calorie malnutrition, severe ([place]) - In the context of chronic illness - At high risk of aspiration - Regular diet with thin liquids    Anemia of chronic disease / Macrocytic anemia / Thrombocytopenia - We stopped subcutaneous heparin 02/26/2017 due to thrombocytopenia - Continue monitoring daily CBC - Platelets are 58 - Hemoglobin 8    Dyslipidemia - Continue Zocor    Essential hypertension - Continue metoprolol    Depression - Continue Paxil and olanzapine    BPH without urinary retention - Continue Proscar and Flomax     PAF (paroxysmal atrial fibrillation) (HCC) - CHADS vasc score 2 - Continue aspirin - continue  metoprolol for rate control    CAD s/p stent placement - Continue aspirin    Sacral pressure skin injury, stage 1 - Per nursing care   DVT prophylaxis: SCDs  Code Status:DNR/DNI Family Communication: Daughter at bedside  Disposition Plan: Still has high oxygen requirement, not stable for discharge; more work of breathing this morning   Consultants:   PT  CCM  SLP  Palliative care  ENT  GI  Cardiology   Procedures:   None  Antimicrobials:   Vanco and cefepime    Subjective: Seems more short of breath this am.  Objective: Vitals:   03/02/17 1510 03/02/17 1926 03/02/17 2122 03/03/17 0522  BP: 111/85  (!) 153/91 120/87  Pulse: (!) 107 100 98 (!) 112  Resp: 16 20 20 20   Temp: 98.3 F (36.8 C)  98.2 F (36.8 C) 97.8 F (36.6 C)  TempSrc: Oral  Oral Oral  SpO2: 100% 100% 100% 100%  Weight:      Height:        Intake/Output Summary (Last 24 hours) at 03/03/17 0752 Last data filed at 03/03/17 0236  Gross per 24 hour  Intake              190 ml  Output              550 ml  Net             -360 ml   Filed Weights   03/06/2017 1021 02/13/2017 1643  Weight: 43.1 kg (95 lb) 40.8 kg (89 lb 14.4 oz)  Examination:  Physical Exam  Constitutional: Appears ill, more short of breath and air hunger  CVS: Rate controlled, S1/S2 + Pulmonary: air hunger, rhonchorosu, coarse sounds  Abdominal: Soft. BS +,  no distension, tenderness, rebound or guarding.  Musculoskeletal: Normal range of motion. No edema and no tenderness.  Lymphadenopathy: No lymphadenopathy noted, cervical, inguinal. Neuro: Alert. No focal deficits  Skin: Skin is warm and dry.  Psychiatric: Not agitated or restless     Data Reviewed: I have personally reviewed following labs and imaging studies  CBC:  Recent Labs Lab 02/25/17 0416 02/27/17 0013 03/02/17 0312 03/03/17 0351  WBC 7.0 5.5 6.6 5.6  HGB 8.4* 7.7* 7.5* 8.0*  HCT 27.6* 25.2* 23.9* 26.0*  MCV 112.7* 112.5* 109.1* 110.2*    PLT 99* 69* 57* 58*   Basic Metabolic Panel:  Recent Labs Lab 02/27/17 0013 02/28/17 0350 03/01/17 0338 03/02/17 0312 03/03/17 0351  NA 146*  --   --   --  149*  K 4.4  --   --   --  3.5  CL 115*  --   --   --  110  CO2 25  --   --   --  33*  GLUCOSE 150*  --   --   --  153*  BUN 34*  --   --   --  25*  CREATININE 1.19 1.12 1.09 1.02 0.98  CALCIUM 7.9*  --   --   --  7.9*   GFR: Estimated Creatinine Clearance: 35.9 mL/min (by C-G formula based on SCr of 0.98 mg/dL). Liver Function Tests: No results for input(s): AST, ALT, ALKPHOS, BILITOT, PROT, ALBUMIN in the last 168 hours. No results for input(s): LIPASE, AMYLASE in the last 168 hours. No results for input(s): AMMONIA in the last 168 hours. Coagulation Profile: No results for input(s): INR, PROTIME in the last 168 hours. Cardiac Enzymes: No results for input(s): CKTOTAL, CKMB, CKMBINDEX, TROPONINI in the last 168 hours. BNP (last 3 results) No results for input(s): PROBNP in the last 8760 hours. HbA1C: No results for input(s): HGBA1C in the last 72 hours. CBG: No results for input(s): GLUCAP in the last 168 hours. Lipid Profile: No results for input(s): CHOL, HDL, LDLCALC, TRIG, CHOLHDL, LDLDIRECT in the last 72 hours. Thyroid Function Tests: No results for input(s): TSH, T4TOTAL, FREET4, T3FREE, THYROIDAB in the last 72 hours. Anemia Panel: No results for input(s): VITAMINB12, FOLATE, FERRITIN, TIBC, IRON, RETICCTPCT in the last 72 hours. Urine analysis:    Component Value Date/Time   COLORURINE YELLOW 02/17/2017 1309   APPEARANCEUR CLEAR 03/06/2017 1309   LABSPEC 1.016 03/05/2017 1309   PHURINE 6.0 02/16/2017 1309   GLUCOSEU NEGATIVE 02/28/2017 1309   GLUCOSEU NEGATIVE 07/26/2013 1142   HGBUR NEGATIVE 03/02/2017 1309   HGBUR trace-intact 11/13/2008 0000   BILIRUBINUR NEGATIVE 02/10/2017 1309   BILIRUBINUR neg 05/16/2012 1300   KETONESUR 5 (A) 03/01/2017 1309   PROTEINUR NEGATIVE 02/05/2017 1309    UROBILINOGEN 0.2 03/25/2014 1637   NITRITE NEGATIVE 02/20/2017 1309   LEUKOCYTESUR NEGATIVE 03/02/2017 1309   Sepsis Labs: @LABRCNTIP (procalcitonin:4,lacticidven:4)   Culture, respiratory (NON-Expectorated)     Status: None   Collection Time: 02/25/17  4:03 PM  Result Value Ref Range Status   Specimen Description INDUCED SPUTUM  Final   Special Requests NONE  Final   Gram Stain   Final   Culture Normal resp flora.  Final   Report Status 02/28/2017 FINAL  Final      Radiology Studies: Dg  Esophagus Result Date: 02/27/2017 Mild narrowing in the proximal cervical esophagus may be related to prominent cricopharyngeus impression. No diverticulum. Large hiatal hernia. Dysmotility, esophageal spasm.   Dg Chest Port 1 View Result Date: 02/26/2017 Multifocal patchy opacities, upper lobe predominant, progressed from the prior and favoring multifocal pneumonia. Small right pleural effusion, new. Electronically Signed   By: Julian Hy M.D.   On: 02/26/2017 13:43   Dg Swallowing Func-speech Pathology 02/26/2017   Scheduled Meds: . arformoterol  15 mcg Nebulization BID  . aspirin EC  81 mg Oral Daily  . budesonide (PULMICORT) nebulizer solution  0.25 mg Nebulization BID  . calcitRIOL  0.25 mcg Oral Daily  . famotidine  20 mg Oral QHS  . feeding supplement  1 Container Oral TID BM  . finasteride  5 mg Oral Daily  . heparin  5,000 Units Subcutaneous Q8H  . methylPREDNISolo  60 mg Intravenous Daily  . metoprolol succina  25 mg Oral Daily  . OLANZapine zydis  2.5 mg Oral QHS  . pantoprazole  40 mg Oral QAC breakfast  . PARoxetine  20 mg Oral Daily  . psyllium  1 packet Oral Daily  . senna  1 tablet Oral BID  . simvastatin  20 mg Oral q1800  . sodium bicarbonate  1,300 mg Oral BID  . sodium chloride HYPERTONIC  4 mL Nebulization BID  . tamsulosin  0.4 mg Oral Daily   Continuous Infusions: . ceFEPime (MAXIPIME) IV Stopped (03/02/17 1356)  . vancomycin Stopped (03/02/17 2009)      LOS: 10 days    Time spent: 25 minutes Greater than 50% of the time spent on counseling and coordinating the care.   Leisa Lenz, MD [facility] Pager [phone]  If 7PM-7AM, please contact night-coverage [email or web address] Password [facility] 03/03/2017, 7:52 AM

## 2017-03-04 LAB — CBC
HCT: 25 % — ABNORMAL LOW (ref 39.0–52.0)
Hemoglobin: 7.7 g/dL — ABNORMAL LOW (ref 13.0–17.0)
MCH: 33.9 pg (ref 26.0–34.0)
MCHC: 30.8 g/dL (ref 30.0–36.0)
MCV: 110.1 fL — ABNORMAL HIGH (ref 78.0–100.0)
PLATELETS: 62 10*3/uL — AB (ref 150–400)
RBC: 2.27 MIL/uL — ABNORMAL LOW (ref 4.22–5.81)
RDW: 15.7 % — AB (ref 11.5–15.5)
WBC: 5.8 10*3/uL (ref 4.0–10.5)

## 2017-03-04 LAB — BASIC METABOLIC PANEL
Anion gap: 4 — ABNORMAL LOW (ref 5–15)
BUN: 21 mg/dL — AB (ref 6–20)
CALCIUM: 7.8 mg/dL — AB (ref 8.9–10.3)
CHLORIDE: 110 mmol/L (ref 101–111)
CO2: 34 mmol/L — ABNORMAL HIGH (ref 22–32)
CREATININE: 0.97 mg/dL (ref 0.61–1.24)
GFR calc Af Amer: >60 mL/min (ref >60)
Glucose, Bld: 125 mg/dL — ABNORMAL HIGH (ref 65–99)
Potassium: 3.3 mmol/L — ABNORMAL LOW (ref 3.5–5.1)
SODIUM: 148 mmol/L — AB (ref 135–145)

## 2017-03-04 MED ORDER — POTASSIUM CHLORIDE 10 MEQ/100ML IV SOLN
10.0000 meq | INTRAVENOUS | Status: AC
  Administered 2017-03-04 (×3): 10 meq via INTRAVENOUS
  Filled 2017-03-04 (×3): qty 100

## 2017-03-04 NOTE — Progress Notes (Addendum)
Patient ID: DHEERAJ HAIL, male   DOB: 1935/10/05, 81 y.o.   MRN: [id number]  PROGRESS NOTE    ZAYLIN RUNCO  OIN:[id number] DOB: 17-Jan-1936 DOA: 03/03/2017  PCP: [name], MD   Brief Narrative:  81 year old male with Wegener's granulomatosis, PUD, esophagitis, hypertension, COPD, CAD and stent placement who presented to ED with shortness of breath, cough and also difficulty swallowing. Pt had GI and ENT evaluation. He is not a candidate for aggressive therapy based on his poor resp status. Palliative was consulted for goals of care.   Assessment & Plan:   Active Problems:   Acute respiratory failure with hypoxia (HCC) / Wegener's granulomatosis ([place]) / Obstructive bronchiectasis (HCC) / Aspiration pneumonitis  - Per pulmonary, patient has end-stage bronchiectasis - Pt seems more short of breath this am and has air hunger  - Continue current nebulizer treatments, Brovana and Pulmicort nebulizers twice daily - No significant changes in resp status  - Continue IV steroids - Continue current abx - Continue oxygen support via     Chronic kidney disease, stage IV (severe) (HCC) - Cr normalized     FTT (failure to thrive) in adult / Protein-calorie malnutrition, severe ([place]) - In the context of chronic illness - At risk of aspiration  - Reg diet    Anemia of chronic disease / Macrocytic anemia / Thrombocytopenia - We stopped subcutaneous heparin 02/26/2017 due to thrombocytopenia - Counts stable so far    Dyslipidemia - Continue zocor    Essential hypertension - Continue metoprolol     Depression - Continue zyprexa, paxil    BPH without urinary retention - Continue Proscar and Flomax      PAF (paroxysmal atrial fibrillation) (HCC) - CHADS vasc score 2 - Continue aspirin - Continue metoprolol for rate control     CAD s/p stent placement - Continue aspirin     Sacral pressure skin injury, stage 1 - Per RN care  DVT prophylaxis: SCD's Code  Status: DNR/DNI Family Communication: wife at bedside this am Disposition Plan: home once stable   Consultants:   PT  CCM  SLP  Palliative care  ENT  GI  Cardiology   Procedures:   None  Antimicrobials:   Vanco and cefepime     Subjective: More disoriented around 3 am this am.  Objective: Vitals:   03/03/17 2110 03/04/17 0532 03/04/17 0952 03/04/17 1111  BP: 122/70 131/71  122/66  Pulse: (!) 101 (!) 108  (!) 104  Resp: (!) 28 (!) 24    Temp: 97.6 F (36.4 C) 97.8 F (36.6 C)    TempSrc: Oral Oral    SpO2: 95% 94% 100%   Weight:      Height:        Intake/Output Summary (Last 24 hours) at 03/04/17 1126 Last data filed at 03/04/17 1023  Gross per 24 hour  Intake              240 ml  Output              200 ml  Net               40 ml   Filed Weights   02/12/2017 1021 02/25/2017 1643  Weight: 43.1 kg (95 lb) 40.8 kg (89 lb 14.4 oz)    Examination:  General exam: Appears calm, no distress Respiratory system: air hunger, wheezing in upper lung lobes Cardiovascular system: S1 & S2 heard, Rate controlled Gastrointestinal system: Abdomen is non  tender, (+) BS Central nervous system: No focal neurological deficits. Extremities: no swelling, palpable pulses Skin: warm, dry  Psychiatry: Not agitated or restless   Data Reviewed: I have personally reviewed following labs and imaging studies  CBC:  Recent Labs Lab 02/27/17 0013 03/02/17 0312 03/03/17 0351 03/04/17 0428  WBC 5.5 6.6 5.6 5.8  HGB 7.7* 7.5* 8.0* 7.7*  HCT 25.2* 23.9* 26.0* 25.0*  MCV 112.5* 109.1* 110.2* 110.1*  PLT 69* 57* 58* 62*   Basic Metabolic Panel:  Recent Labs Lab 02/27/17 0013 02/28/17 0350 03/01/17 0338 03/02/17 0312 03/03/17 0351 03/04/17 0428  NA 146*  --   --   --  149* 148*  K 4.4  --   --   --  3.5 3.3*  CL 115*  --   --   --  110 110  CO2 25  --   --   --  33* 34*  GLUCOSE 150*  --   --   --  153* 125*  BUN 34*  --   --   --  25* 21*  CREATININE 1.19  1.12 1.09 1.02 0.98 0.97  CALCIUM 7.9*  --   --   --  7.9* 7.8*   GFR: Estimated Creatinine Clearance: 36.2 mL/min (by C-G formula based on SCr of 0.97 mg/dL). Liver Function Tests: No results for input(s): AST, ALT, ALKPHOS, BILITOT, PROT, ALBUMIN in the last 168 hours. No results for input(s): LIPASE, AMYLASE in the last 168 hours. No results for input(s): AMMONIA in the last 168 hours. Coagulation Profile: No results for input(s): INR, PROTIME in the last 168 hours. Cardiac Enzymes: No results for input(s): CKTOTAL, CKMB, CKMBINDEX, TROPONINI in the last 168 hours. BNP (last 3 results) No results for input(s): PROBNP in the last 8760 hours. HbA1C: No results for input(s): HGBA1C in the last 72 hours. CBG: No results for input(s): GLUCAP in the last 168 hours. Lipid Profile: No results for input(s): CHOL, HDL, LDLCALC, TRIG, CHOLHDL, LDLDIRECT in the last 72 hours. Thyroid Function Tests: No results for input(s): TSH, T4TOTAL, FREET4, T3FREE, THYROIDAB in the last 72 hours. Anemia Panel: No results for input(s): VITAMINB12, FOLATE, FERRITIN, TIBC, IRON, RETICCTPCT in the last 72 hours. Urine analysis:    Component Value Date/Time   COLORURINE YELLOW 02/27/2017 1309   APPEARANCEUR CLEAR 02/14/2017 1309   LABSPEC 1.016 02/18/2017 1309   PHURINE 6.0 03/05/2017 1309   GLUCOSEU NEGATIVE 02/16/2017 1309   GLUCOSEU NEGATIVE 07/26/2013 1142   HGBUR NEGATIVE 03/04/2017 1309   HGBUR trace-intact 11/13/2008 0000   BILIRUBINUR NEGATIVE 02/07/2017 1309   BILIRUBINUR neg 05/16/2012 1300   KETONESUR 5 (A) 02/14/2017 1309   PROTEINUR NEGATIVE 02/14/2017 1309   UROBILINOGEN 0.2 03/25/2014 1637   NITRITE NEGATIVE 02/18/2017 1309   LEUKOCYTESUR NEGATIVE 02/07/2017 1309   Sepsis Labs: @LABRCNTIP (procalcitonin:4,lacticidven:4)    Recent Results (from the past 240 hour(s))  Culture, respiratory (NON-Expectorated)     Status: None   Collection Time: 02/25/17  4:03 PM  Result Value Ref  Range Status   Specimen Description INDUCED SPUTUM  Final   Special Requests NONE  Final   Gram Stain   Final    ABUNDANT WBC PRESENT,BOTH PMN AND MONONUCLEAR FEW SQUAMOUS EPITHELIAL CELLS PRESENT MODERATE GRAM NEGATIVE RODS MODERATE GRAM POSITIVE COCCI IN CHAINS IN CLUSTERS FEW GRAM POSITIVE RODS RARE BUDDING YEAST SEEN    Culture Consistent with normal respiratory flora.  Final   Report Status 02/28/2017 FINAL  Final      Radiology  Studies: No results found.      Scheduled Meds: . arformoterol  15 mcg Nebulization BID  . aspirin EC  81 mg Oral Daily  . budesonide (PULMICORT) nebulizer solution  0.25 mg Nebulization BID  . calcitRIOL  0.25 mcg Oral Daily  . famotidine  20 mg Oral QHS  . feeding supplement  1 Container Oral TID BM  . finasteride  5 mg Oral Daily  . mouth rinse  15 mL Mouth Rinse BID  . methylPREDNISolone (SOLU-MEDROL) injection  60 mg Intravenous Daily  . metoprolol succinate  25 mg Oral Daily  . OLANZapine zydis  5 mg Oral QHS  . pantoprazole  40 mg Oral QAC breakfast  . PARoxetine  20 mg Oral Daily  . psyllium  1 packet Oral Daily  . senna  1 tablet Oral BID  . simvastatin  20 mg Oral q1800  . sodium bicarbonate  1,300 mg Oral BID  . tamsulosin  0.4 mg Oral Daily   Continuous Infusions: . ceFEPime (MAXIPIME) IV Stopped (03/03/17 1212)  . vancomycin Stopped (03/03/17 1650)     LOS: 11 days    Time spent: 25 minutes  Greater than 50% of the time spent on counseling and coordinating the care.   Leisa Lenz, MD [facility] Pager [phone]  If 7PM-7AM, please contact night-coverage [email or web address] Password [facility] 03/04/2017, 11:26 AM

## 2017-03-04 NOTE — Progress Notes (Signed)
Came to give breathing treatment. Pt resting well at this time and family member stated he had a rough night and would have staff call when pt ready for breathing tx.

## 2017-03-05 LAB — BASIC METABOLIC PANEL
ANION GAP: 4 — AB (ref 5–15)
BUN: 22 mg/dL — AB (ref 6–20)
CO2: 36 mmol/L — ABNORMAL HIGH (ref 22–32)
CREATININE: 0.97 mg/dL (ref 0.61–1.24)
Calcium: 7.8 mg/dL — ABNORMAL LOW (ref 8.9–10.3)
Chloride: 108 mmol/L (ref 101–111)
GFR calc Af Amer: >60 mL/min (ref >60)
Glucose, Bld: 141 mg/dL — ABNORMAL HIGH (ref 65–99)
POTASSIUM: 3.8 mmol/L (ref 3.5–5.1)
SODIUM: 148 mmol/L — AB (ref 135–145)

## 2017-03-05 LAB — CBC
HCT: 23.3 % — ABNORMAL LOW (ref 39.0–52.0)
Hemoglobin: 7.1 g/dL — ABNORMAL LOW (ref 13.0–17.0)
MCH: 34.1 pg — ABNORMAL HIGH (ref 26.0–34.0)
MCHC: 30.5 g/dL (ref 30.0–36.0)
MCV: 112 fL — ABNORMAL HIGH (ref 78.0–100.0)
PLATELETS: 58 10*3/uL — AB (ref 150–400)
RBC: 2.08 MIL/uL — AB (ref 4.22–5.81)
RDW: 15.9 % — AB (ref 11.5–15.5)
WBC: 5.2 10*3/uL (ref 4.0–10.5)

## 2017-03-05 NOTE — Progress Notes (Signed)
Pharmacy Antibiotic Note  Richard [name] is a 81 y.o. male admitted on 02/18/2017 with pneumonia.  Pharmacy has been consulted for vancomycin and cefepime dosing.  Patient remains afebrile, noted poor clinical status in setting of end-stage bronchiectasis. Palliative following. SCr is stable   Plan: Continue Vancomycin 1000 mg IV every 24 hours.  Goal trough 15-20 mcg/mL. Continue Cefepime 1 gram IV every 24 hours. Monitor renal function and clinical status  Height: 5\' 7"  (170.2 cm) Weight: 89 lb 14.4 oz (40.8 kg) IBW/kg (Calculated) : 66.1  Temp (24hrs), Avg:98.3 F (36.8 C), Min:98 F (36.7 C), Max:98.7 F (37.1 C)   Recent Labs Lab 02/27/17 0013  03/01/17 0338 03/02/17 0312 03/02/17 1221 03/03/17 0351 03/04/17 0428 03/05/17 0355  WBC 5.5  --   --  6.6  --  5.6 5.8 5.2  CREATININE 1.19  < > 1.09 1.02  --  0.98 0.97 0.97  VANCOTROUGH  --   --   --   --  11*  --   --   --   < > = values in this interval not displayed.  Estimated Creatinine Clearance: 36.2 mL/min (by C-G formula based on SCr of 0.97 mg/dL).    No Known Allergies  Antimicrobials this admission: Vanc 07/23>> Cefepime 07/23>> Levaquin 07/17>>07/23  Dose adjustments this admission: 7/26: Increase vancomycin from 500 mg q 24hr to 1000 mg q 24hr  Microbiology results: 7/21 Resp cx: Consistent with normal respiratory flora   Thank you for allowing [name] to participate in this patients care.  Jens Som, PharmD Clinical phone for 03/05/2017 from 7a-3:30p: [phone] If after 3:30p, please call main pharmacy at: [phone] 03/05/2017 10:44 AM

## 2017-03-05 NOTE — Progress Notes (Signed)
Patient ID: Richard [name], male   DOB: 1935-11-29, 81 y.o.   MRN: [id number]  PROGRESS NOTE    Richard [name]  YTK:[id number] DOB: 1935-11-20 DOA: 02/07/2017  PCP: [name], MD   Brief Narrative:  81 year old male with Wegener's granulomatosis, PUD, esophagitis, hypertension, COPD, CAD and stent placement who presented to ED with shortness of breath, cough and also difficulty swallowing. Pt had GI and ENT evaluation. He is not a candidate for aggressive therapy based on his poor resp status. Palliative was consulted for goals of care.   Assessment & Plan:   Active Problems: Acute respiratory failure with hypoxia (HCC) / Wegener's granulomatosis ([place]) / Obstructive bronchiectasis (HCC) / Aspiration pneumonitis  - Per pulmonary, patient has end-stage bronchiectasis - Continue current nebulizer treatments, Brovana and Pulmicort nebulizers twice daily - Continue solumedrol 60 mg IV daily - Continue cefepime and vanco - Continue oxygen support via [place] to keep O2 sats above 90%  Chronic kidney disease, stage IV (severe) (HCC) - Cr WNL  FTT (failure to thrive) in adult / Protein-calorie malnutrition, severe ([place]) - In the context of chronic illness - Regular diet with aspiration precaution   Anemia of chronic disease / Macrocytic anemia / Thrombocytopenia - We stopped subcutaneous heparin 02/26/2017 due to thrombocytopenia - Monitor daily CBC - Counts overall stable. Hgb 7.1 this am (transfuse for hgb less than 7)  Dyslipidemia - Continue Zocor   Essential hypertension - Continue metoprolol   Depression - Continue Zyprexa, PAxil  BPH without urinary retention - Continue Proscar and Flomax   PAF (paroxysmal atrial fibrillation) (HCC) - CHADS vasc score 2 - Continue aspirin - Continue metoprolol for HR control   CAD s/p stent placement - Continue aspirin   Sacral pressure skin injury, stage 1 - Per RN care   DVT prophylaxis:  SCD's Code Status:DNR/DNI Family Communication: daughter at the bedside  Disposition Plan: home once resp status better, he is more alert this am so hope he continues to improve    Consultants:   PT  CCM  SLP  Palliative care  ENT  GI  Cardiology  Procedures:   None   Antimicrobials:   Vanco and cefepime -->    Subjective: More alert this am.  Objective: Vitals:   03/05/17 0740 03/05/17 0743 03/05/17 0855 03/05/17 0900  BP:   131/82   Pulse:   (!) 109   Resp:      Temp:    98.7 F (37.1 C)  TempSrc:   Oral   SpO2: 95% 96%    Weight:      Height:        Intake/Output Summary (Last 24 hours) at 03/05/17 1343 Last data filed at 03/05/17 5573  Gross per 24 hour  Intake                0 ml  Output              750 ml  Net             -750 ml   Filed Weights   03/06/2017 1021 02/05/2017 1643  Weight: 43.1 kg (95 lb) 40.8 kg (89 lb 14.4 oz)    Examination:  General exam: Appears calm and comfortable  Respiratory system: gurgling sounds (+), wheezing in upper lung lobes  Cardiovascular system: S1 & S2 heard, Rate controlled  Gastrointestinal system: Abdomen is nondistended, soft and nontender. No organomegaly or masses felt. Normal bowel sounds heard. Central nervous system:  No focal neurological deficits. More alert this am Extremities: No swelling, palpable pulses  Skin: No rashes, lesions or ulcers Psychiatry: Normal mood and behavior    Data Reviewed: I have personally reviewed following labs and imaging studies  CBC:  Recent Labs Lab 02/27/17 0013 03/02/17 0312 03/03/17 0351 03/04/17 0428 03/05/17 0355  WBC 5.5 6.6 5.6 5.8 5.2  HGB 7.7* 7.5* 8.0* 7.7* 7.1*  HCT 25.2* 23.9* 26.0* 25.0* 23.3*  MCV 112.5* 109.1* 110.2* 110.1* 112.0*  PLT 69* 57* 58* 62* 58*   Basic Metabolic Panel:  Recent Labs Lab 02/27/17 0013  03/01/17 0338 03/02/17 0312 03/03/17 0351 03/04/17 0428 03/05/17 0355  NA 146*  --   --   --  149* 148* 148*  K 4.4   --   --   --  3.5 3.3* 3.8  CL 115*  --   --   --  110 110 108  CO2 25  --   --   --  33* 34* 36*  GLUCOSE 150*  --   --   --  153* 125* 141*  BUN 34*  --   --   --  25* 21* 22*  CREATININE 1.19  < > 1.09 1.02 0.98 0.97 0.97  CALCIUM 7.9*  --   --   --  7.9* 7.8* 7.8*  < > = values in this interval not displayed. GFR: Estimated Creatinine Clearance: 36.2 mL/min (by C-G formula based on SCr of 0.97 mg/dL). Liver Function Tests: No results for input(s): AST, ALT, ALKPHOS, BILITOT, PROT, ALBUMIN in the last 168 hours. No results for input(s): LIPASE, AMYLASE in the last 168 hours. No results for input(s): AMMONIA in the last 168 hours. Coagulation Profile: No results for input(s): INR, PROTIME in the last 168 hours. Cardiac Enzymes: No results for input(s): CKTOTAL, CKMB, CKMBINDEX, TROPONINI in the last 168 hours. BNP (last 3 results) No results for input(s): PROBNP in the last 8760 hours. HbA1C: No results for input(s): HGBA1C in the last 72 hours. CBG: No results for input(s): GLUCAP in the last 168 hours. Lipid Profile: No results for input(s): CHOL, HDL, LDLCALC, TRIG, CHOLHDL, LDLDIRECT in the last 72 hours. Thyroid Function Tests: No results for input(s): TSH, T4TOTAL, FREET4, T3FREE, THYROIDAB in the last 72 hours. Anemia Panel: No results for input(s): VITAMINB12, FOLATE, FERRITIN, TIBC, IRON, RETICCTPCT in the last 72 hours. Urine analysis:    Component Value Date/Time   COLORURINE YELLOW 02/07/2017 1309   APPEARANCEUR CLEAR 02/26/2017 1309   LABSPEC 1.016 02/24/2017 1309   PHURINE 6.0 02/23/2017 1309   GLUCOSEU NEGATIVE 02/25/2017 1309   GLUCOSEU NEGATIVE 07/26/2013 1142   HGBUR NEGATIVE 03/03/2017 1309   HGBUR trace-intact 11/13/2008 0000   BILIRUBINUR NEGATIVE 02/06/2017 1309   BILIRUBINUR neg 05/16/2012 1300   KETONESUR 5 (A) 02/14/2017 1309   PROTEINUR NEGATIVE 02/15/2017 1309   UROBILINOGEN 0.2 03/25/2014 1637   NITRITE NEGATIVE 02/18/2017 1309    LEUKOCYTESUR NEGATIVE 02/16/2017 1309   Sepsis Labs: @LABRCNTIP (procalcitonin:4,lacticidven:4)   ) Recent Results (from the past 240 hour(s))  Culture, respiratory (NON-Expectorated)     Status: None   Collection Time: 02/25/17  4:03 PM  Result Value Ref Range Status   Specimen Description INDUCED SPUTUM  Final   Special Requests NONE  Final   Gram Stain   Final    ABUNDANT WBC PRESENT,BOTH PMN AND MONONUCLEAR FEW SQUAMOUS EPITHELIAL CELLS PRESENT MODERATE GRAM NEGATIVE RODS MODERATE GRAM POSITIVE COCCI IN CHAINS IN CLUSTERS FEW GRAM POSITIVE RODS RARE  BUDDING YEAST SEEN    Culture Consistent with normal respiratory flora.  Final   Report Status 02/28/2017 FINAL  Final      Radiology Studies: No results found.      Scheduled Meds: . arformoterol  15 mcg Nebulization BID  . aspirin EC  81 mg Oral Daily  . budesonide (PULMICORT) nebulizer solution  0.25 mg Nebulization BID  . calcitRIOL  0.25 mcg Oral Daily  . famotidine  20 mg Oral QHS  . feeding supplement  1 Container Oral TID BM  . finasteride  5 mg Oral Daily  . mouth rinse  15 mL Mouth Rinse BID  . methylPREDNISolone (SOLU-MEDROL) injection  60 mg Intravenous Daily  . metoprolol succinate  25 mg Oral Daily  . OLANZapine zydis  5 mg Oral QHS  . pantoprazole  40 mg Oral QAC breakfast  . PARoxetine  20 mg Oral Daily  . psyllium  1 packet Oral Daily  . senna  1 tablet Oral BID  . simvastatin  20 mg Oral q1800  . sodium bicarbonate  1,300 mg Oral BID  . tamsulosin  0.4 mg Oral Daily   Continuous Infusions: . ceFEPime (MAXIPIME) IV Stopped (03/04/17 1157)  . vancomycin Stopped (03/04/17 1843)     LOS: 12 days    Time spent: 25 minutes  Greater than 50% of the time spent on counseling and coordinating the care.   Leisa Lenz, MD [facility] Pager [phone]  If 7PM-7AM, please contact night-coverage [email or web address] Password [email or web address] 03/05/2017, 1:43 PM

## 2017-03-06 ENCOUNTER — Inpatient Hospital Stay (HOSPITAL_COMMUNITY): Payer: Medicare Other

## 2017-03-06 ENCOUNTER — Other Ambulatory Visit: Payer: Self-pay

## 2017-03-06 LAB — BLOOD GAS, ARTERIAL
Acid-Base Excess: 8.7 mmol/L — ABNORMAL HIGH (ref 0.0–2.0)
Bicarbonate: 34.5 mmol/L — ABNORMAL HIGH (ref 20.0–28.0)
Delivery systems: POSITIVE
Drawn by: 275531
Expiratory PAP: 6
FIO2: 0.3
INSPIRATORY PAP: 12
LHR: 8 {breaths}/min
O2 Saturation: 86.5 %
Patient temperature: 98.6
pCO2 arterial: 66.1 mmHg (ref 32.0–48.0)
pH, Arterial: 7.338 — ABNORMAL LOW (ref 7.350–7.450)
pO2, Arterial: 56.3 mmHg — ABNORMAL LOW (ref 83.0–108.0)

## 2017-03-06 LAB — BASIC METABOLIC PANEL
Anion gap: 6 (ref 5–15)
BUN: 27 mg/dL — AB (ref 6–20)
CALCIUM: 8 mg/dL — AB (ref 8.9–10.3)
CO2: 36 mmol/L — AB (ref 22–32)
CREATININE: 1.09 mg/dL (ref 0.61–1.24)
Chloride: 105 mmol/L (ref 101–111)
GFR calc non Af Amer: >60 mL/min (ref >60)
Glucose, Bld: 109 mg/dL — ABNORMAL HIGH (ref 65–99)
Potassium: 3.6 mmol/L (ref 3.5–5.1)
SODIUM: 147 mmol/L — AB (ref 135–145)

## 2017-03-06 LAB — CBC
HCT: 24.3 % — ABNORMAL LOW (ref 39.0–52.0)
Hemoglobin: 7.6 g/dL — ABNORMAL LOW (ref 13.0–17.0)
MCH: 34.2 pg — AB (ref 26.0–34.0)
MCHC: 31.3 g/dL (ref 30.0–36.0)
MCV: 109.5 fL — ABNORMAL HIGH (ref 78.0–100.0)
PLATELETS: 77 10*3/uL — AB (ref 150–400)
RBC: 2.22 MIL/uL — ABNORMAL LOW (ref 4.22–5.81)
RDW: 15.2 % (ref 11.5–15.5)
WBC: 9.3 10*3/uL (ref 4.0–10.5)

## 2017-03-06 LAB — PREPARE RBC (CROSSMATCH)

## 2017-03-06 LAB — MAGNESIUM: Magnesium: 1.8 mg/dL (ref 1.7–2.4)

## 2017-03-06 MED ORDER — MORPHINE SULFATE (PF) 2 MG/ML IV SOLN
2.0000 mg | INTRAVENOUS | Status: DC | PRN
  Administered 2017-03-06: 2 mg via INTRAVENOUS
  Filled 2017-03-06: qty 1

## 2017-03-06 MED ORDER — MAGNESIUM SULFATE 2 GM/50ML IV SOLN
2.0000 g | Freq: Once | INTRAVENOUS | Status: AC
  Administered 2017-03-06: 2 g via INTRAVENOUS
  Filled 2017-03-06: qty 50

## 2017-03-06 MED ORDER — MORPHINE SULFATE (PF) 2 MG/ML IV SOLN: 1.0000 mg | INTRAVENOUS | Status: DC | PRN

## 2017-03-06 MED ORDER — DEXTROSE 5 % IV BOLUS
1000.0000 mL | Freq: Once | INTRAVENOUS | Status: AC
  Administered 2017-03-06: 1000 mL via INTRAVENOUS

## 2017-03-06 MED ORDER — DILTIAZEM HCL 100 MG IV SOLR
5.0000 mg/h | INTRAVENOUS | Status: DC
  Filled 2017-03-06: qty 100

## 2017-03-06 MED ORDER — SODIUM CHLORIDE 0.9 % IV SOLN: Freq: Once | INTRAVENOUS | Status: DC

## 2017-03-06 MED ORDER — POTASSIUM CHLORIDE CRYS ER 20 MEQ PO TBCR
40.0000 meq | EXTENDED_RELEASE_TABLET | Freq: Once | ORAL | Status: DC
  Filled 2017-03-06: qty 2

## 2017-03-06 MED ORDER — SODIUM CHLORIDE 0.9 % IV BOLUS (SEPSIS)
1000.0000 mL | Freq: Once | INTRAVENOUS | Status: AC
  Administered 2017-03-06: 1000 mL via INTRAVENOUS

## 2017-03-06 MED ORDER — GLYCOPYRROLATE 0.2 MG/ML IJ SOLN
0.2000 mg | Freq: Four times a day (QID) | INTRAMUSCULAR | Status: DC
  Administered 2017-03-06 – 2017-03-07 (×5): 0.2 mg via INTRAVENOUS
  Filled 2017-03-06 (×5): qty 1

## 2017-03-06 NOTE — Significant Event (Signed)
Rapid Response Event Note  Overview: Time Called: 0956 Arrival Time: 1000 Event Type: Respiratory  Initial Focused Assessment: Patient with increased work of breathing and decreased O2 sat on [place] 83%. Lung sounds rhonchi and crackles, heart murmur Non productive wet cough BP 108/59  ST 111  RR 32  O2 sat 83% on 6L   Interventions: Repositioned,  Placed on 55% Venturi mouth care done O2 sats improved to 93% Palliative care NP at bedside, discussing options with patient and son.  Plan of Care (if not transferred): Morphine for labored breathing Family meeting with palliative care this afternoon RN to call if assistance needed  Event Summary: Name of Physician Notified: Rizwan at 49  Name of Consulting Physician Notified: Palliative care at    Outcome: Code status clarified  Event End Time: 1032  Raliegh Ip

## 2017-03-06 NOTE — Progress Notes (Addendum)
OT Cancellation Note  Patient Details Name: Richard [name] MRN: [id number] DOB: May 20, 1936   Cancelled Treatment:    Reason Eval/Treat Not Completed: Medical issues which prohibited therapy. Pt with medical decline this PM and transferring units at this time. Note multiple instances in which pt declined to participate with both OT and PT and PT signing off this date. OT will check back as able and appropriate if pt willing to participate.   Norman Herrlich, MS OTR/L  Pager: [phone]   Norman Herrlich 03/06/2017, 3:56 PM

## 2017-03-06 NOTE — Progress Notes (Signed)
Report given to [facility] in stepdown unit on 2C. Patient finishing NS bolus. Vancomycin placed with patient to be transfused in stepdown. SWOT nurses transferring patient. Patient on Bipap.

## 2017-03-06 NOTE — Progress Notes (Signed)
RN was just notified that pt had about 8 runs of V-tach at 5: 49 am. Vitals and EKG completed. Pt is asymptomatic. MD was notified. Care passed on to the day RN.

## 2017-03-06 NOTE — Progress Notes (Signed)
PT Cancellation Note/ Discharge  Patient Details Name: Richard [name] MRN: [id number] DOB: 05-09-1936   Cancelled Treatment:    Reason Eval/Treat Not Completed: Patient declined, no reason specified. Pt again declined working with therapy which is 5x in a row. Signing off per department policy, pt aware and agreeable.    Lief Palmatier B Makisha Marrin 03/06/2017, 8:41 AM  Elwyn Reach, [place]

## 2017-03-06 NOTE — Progress Notes (Signed)
Daily Progress Note   Patient Name: Richard [name]       Date: 03/06/2017 DOB: 1936/07/20  Age: 81 y.o. MRN#: [id number] Attending Physician: Debbe Odea, MD Primary Care Physician: [name], MD Admit Date: 02/11/2017  Reason for Consultation/Follow-up: Establishing goals of care, Non pain symptom management and Psychosocial/spiritual support  Subjective: Mr. Barnhard looks slightly worse than when I last saw him on Friday. He is more short of breath and endorsing increased respiratory struggle. He remains quite weak and he let me know he has not been able to work with either PT/OT due to fatigue. He has not been out of bed "for a while." No improvement in his appetite and he continues to have minimal oral intake (a few sips of liquid at most).   Length of Stay: 13  Current Medications: Scheduled Meds:  . arformoterol  15 mcg Nebulization BID  . aspirin EC  81 mg Oral Daily  . budesonide (PULMICORT) nebulizer solution  0.25 mg Nebulization BID  . calcitRIOL  0.25 mcg Oral Daily  . famotidine  20 mg Oral QHS  . feeding supplement  1 Container Oral TID BM  . finasteride  5 mg Oral Daily  . glycopyrrolate  0.2 mg Intravenous Q6H  . mouth rinse  15 mL Mouth Rinse BID  . methylPREDNISolone (SOLU-MEDROL) injection  60 mg Intravenous Daily  . metoprolol succinate  25 mg Oral Daily  . OLANZapine zydis  5 mg Oral QHS  . pantoprazole  40 mg Oral QAC breakfast  . PARoxetine  20 mg Oral Daily  . potassium chloride  40 mEq Oral Once  . psyllium  1 packet Oral Daily  . senna  1 tablet Oral BID  . simvastatin  20 mg Oral q1800  . tamsulosin  0.4 mg Oral Daily    Continuous Infusions: . ceFEPime (MAXIPIME) IV Stopped (03/05/17 1352)  . magnesium sulfate 1 - 4 g bolus IVPB    . vancomycin Stopped (03/05/17 1713)    PRN  Meds: acetaminophen **OR** acetaminophen, albuterol, benzonatate, gi cocktail, guaiFENesin, lidocaine, lidocaine, lidocaine-EPINEPHrine, morphine injection, nitroGLYCERIN, ondansetron **OR** ondansetron (ZOFRAN) IV, oxymetazoline, silver nitrate applicators, simethicone, TRIPLE ANTIBIOTIC  Physical Exam    Constitutional: He is oriented to person, place, and time. He appears cachectic. He has a sickly appearance.  Venturi mask placed while I was at the bedside.  HENT:  Head: Normocephalic and atraumatic.  Mouth/Throat: Oropharynx is clear. Mucous membranes are dry. No oropharyngeal exudate.  Eyes: EOM are normal.  Neck: Normal range of motion. Neck supple.  Cardiovascular: Tachycardic. An irregularly irregular rhythm present.  Murmur heard. Pulmonary/Chest: Accessory muscle usage present. He has decreased breath sounds in the right lower field and the left lower field.He has rhonchi (scattered).  Increased congestion. Moderate secretions heard in upper airway, cough too weak to clear.  Abdominal: Soft. Bowel sounds are normal. No abdominal tenderness. Musculoskeletal: Normal range of motion. He exhibits no edema.  Generalized weakness, minimally moving self in bed.  Neurological: He is oriented to person, place, and time. Lethargic. Skin: Skin is warm and dry. Bruising noted. There is pallor.  Psychiatric: Judgment and thought content normal. His speech is delayed. He is withdrawn.  Cognition and memory are normal.  Flat affect. Lethargic.     Vital Signs: BP (!) 108/59   Pulse (!) 111   Temp 98.5 F (36.9 C) (Axillary)   Resp 18   Ht 5\' 7"  (1.702 m)   Wt 40.8 kg (89 lb 14.4 oz)   SpO2 93%   BMI 14.08 kg/m  SpO2: SpO2: 93 % O2 Device: O2 Device: Venturi Mask O2 Flow Rate: O2 Flow Rate (L/min): 14 L/min  Intake/output summary:   Intake/Output Summary (Last 24 hours) at 03/06/17 1041 Last data filed at 03/06/17 0607  Gross per 24 hour  Intake                0 ml  Output              1150 ml  Net            -1150 ml   LBM: Last BM Date: 03/03/17 Baseline Weight: Weight: 43.1 kg (95 lb) Most recent weight: Weight: 40.8 kg (89 lb 14.4 oz)   Palliative Assessment/Data: PPS 20-30%   Flowsheet Rows     Most Recent Value  Intake Tab  Referral Department  Hospitalist  Unit at Time of Referral  Med/Surg Unit  Palliative Care Primary Diagnosis  Pulmonary  Date Notified  02/27/17  Palliative Care Type  New Palliative care  Reason for referral  Clarify Goals of Care  Date of Admission  02/05/2017  Date first seen by Palliative Care  02/28/17  # of days Palliative referral response time  1 Day(s)  # of days IP prior to Palliative referral  6  Clinical Assessment  Psychosocial & Spiritual Assessment  Palliative Care Outcomes      Patient Active Problem List   Diagnosis Date Noted  . Palliative care by specialist   . Goals of care, counseling/discussion   . Pressure injury of skin 02/22/2017  . PAF (paroxysmal atrial fibrillation) ([place]) 02/22/2017  . Acute respiratory failure with hypoxia ([place]) 02/12/2017  . Chest pressure 03/03/2017  . FTT (failure to thrive) in adult 03/06/2017  . Anemia, chronic disease   . Dehydration   . HCAP (healthcare-associated pneumonia)   . Sepsis ([place]) 01/05/2017  . Lobar pneumonia ([place]) 01/05/2017  . Compression fracture of thoracic spine, non-traumatic ([place]) 01/05/2017  . AKI (acute kidney injury) ([place]) 01/05/2017  . Hypotension 01/05/2017  . CAD in native artery 11/14/2016  . Melena 11/01/2016  . Malnutrition of moderate degree 08/22/2016  . Influenza A 08/21/2016  . AVM (arteriovenous malformation) of colon with hemorrhage   . Symptomatic anemia 07/30/2016  . Gastrointestinal hemorrhage with melena 07/30/2016  . Aortic valve stenosis 07/08/2016  . Protein-calorie malnutrition, severe ([place]) 04/21/2015  . CAP (community acquired pneumonia) 04/18/2015  . Chronic kidney disease, stage IV (severe) ([place]) 03/26/2015  .  GERD (gastroesophageal reflux disease) 08/20/2014  . Wegener's granulomatosis with renal involvement ([place]) 07/15/2014  . Pseudoaneurysm of right femoral artery ([place]) 04/08/2014  . Acute blood loss anemia 03/20/2014  . CAD- severe 4 V CAD- not CABG candidate: BMS-pLAD, mRCA &PTCA of 100% Cx-OM 03/17/2014  . Anemia 03/17/2014  . CKD (chronic kidney disease), stage III 03/17/2014  . Cardiomyopathy, ischemic- EF 40% by [facility] 03/17/2014  . Dyspnea 03/13/2014  . Abnormal nuclear stress test: Intermediate risk with moderate region of apical and inferior scar with mild superimposed ischemia; EF 40% 03/12/2014  . Loss of weight 12/12/2013  . Abdominal pain, unspecified site 12/12/2013  . SVT (supraventricular tachycardia) ([place]) 10/04/2013  .  SOB (shortness of breath) 10/04/2013  . Weakness generalized 10/04/2013  . Hypertension 09/28/2013  . Chronic rhinitis 09/27/2013  . Chronic respiratory failure ([place]) 12/26/2012  . Bronchiectasis with acute exacerbation ([place]) 09/28/2011  . Dysphagia 10/25/2010  . Wegener's granulomatosis ([place]) 05/10/2010  . GERD 03/10/2010  . ARTHRITIS, CERVICAL SPINE 03/10/2010  . Hypothyroidism 11/13/2008  . VITAMIN D DEFICIENCY 11/13/2008  . Hyperlipidemia with target low density lipoprotein (LDL) cholesterol less than 70 mg/dL 11/13/2008  . ANEMIA 11/13/2008  . BPH (benign prostatic hyperplasia) 12/04/2007  . Obstructive bronchiectasis ([place]) 01/25/2007    Palliative Care Assessment & Plan   HPI: 81 y.o. male  with past medical history of obstructive bronchiectasis then diagnosed with Wegener's Granulomatosis with renal and pulmonary involvement (initial diagnosis 2011), COPD, CAD s/p cardiac cath and stent placement, HTN, dysphagia, PUD, and esophagitis. Pt presented to the ED with multiple medical issues, which included worsening dyspnea on exertion, chest pain, and appetite loss with progressive weakness and fatigue. He was seen by Pulmonology one day prior to  admission. He was admitted on 02/15/2017 for acute on chronic hypoxic respiratory failure secondary to acute exacerbation of obstructive bronchiectasis, which is complicated by FTT from chronic dysphagia and poor oral intake. GI consulted for dysphagia and identified a cricopharyngeal dysfunction with global GI dysmotility; they have little to offer to help with this and are suggesting Palliative. Pulmonology also following but are concerned that he is approaching the end of his life. Per GI notes: "He has then been in a progressive cycle of pain [thoracic spine fx 2 mo ago], poor nutrition, recurrent pulmonary infection, weakness and greater difficulty swallowing."   Assessment: In my long meeting with Mr. Kluever and his family on 7/25 we talked through his health issues, explored his perceptions of his overall health trajectory, and discussed care options moving forward. While he did recognize a trending decline in his health, he was nevertheless focused on improving back to his prior level of wellness. To that end, he wanted to continue aggressive care, to include repeat hospitalizations as needed and interventions available. While I validated his goal, I also expressed by strong concern that he was physically very weak, and he would not improve if he wasn't eating. He verbally affirmed his commitment to doing what he could to improve, including focusing on eating (my recommendation was small amounts frequently throughout the day).  In follow-up days I became increasingly concerned that he is continuing to become weaker and has minimal oral intake despite full supportive care. Olanzapine was started on 7/26, with good tolerance but little effect. I will increase this from 2.5 to 5mg  tonight. I sat down and talked with him about my concerns. He also shared them and was able to express things were not improving despite all the medication. He does recognize that not eating/having no appetite may be a sign that  his body is shutting down. I talked through the need to start thinking about where he goes from the hospital, and specifically thinking about how and where he wants to spend his time for whatever he has left. We talked through home with Hospice, home with home health, and SNF. I recommended home with Hospice given the high level of support he will need, and the value of treating symptoms in place to break the cyclical hospitalizations (which his wife is clearly frustrated by).   Today, Mr. Rosenberg has not improved and is actually looking worse compared to when I last saw him on Friday. His  work of breathing has worsened, and he is endorsing increased anxiety and distress related to his breathing. He was de-saturating on [place], and was transitioned to a Venturi mask with improvement in his O2 saturation. I sat down with him and queried his thoughts on how things were going. He acknowledges things have not improved and he is feeling worse. He had thought about our conversation on Friday, and felt hospice made the most sense at this point. Given his respiratory status and worsening symptom burden, I shared that home would likely not be an option now. I discussed residential hospice as a possibility, which he was agreeable to. He does want the chance to discuss this with his family.  ADDENDUM 1300-1330: Mr. Monica family have all arrived to his room. His wife was present, along with his three children, son-in law, daughter-in law, and grandson. I gave a medical recap of what health issues he was struggling with, and how he was being medically treated. I also clarified that, despite these interventions, he had minimal improvement. His appetite remains poor and significantly inadequate, he continues to have increased work of breathing and required transition to a Venturi mask today, and he is significantly weaker-has refused PT/OT- and is barely moving himself in bed. Given his lack of improvement, and in some ways  ongoing decline, I think it is increasingly important to discuss and clarify his goals of care and disposition plan. Per his request, I shared the options moving forward again so everyone could hear the information and be on the same page to discuss it.  I brought up two different paths forward and reviewed the goal and likely outcome of these options. One would be SNF, which would signify that he still held the belief he could improve, and he still wanted efforts focused around interventions to prolong his life.  In this path I would expect repeat hospitalizations and a prolonged period of debility. The other option is for residential hospice. The focus would be on comfort and quality of life, with the expectation that he is at the end of his life and wants to focus his time and energy on being comfortable. At this point I do not believe he could go home given his respiratory status and symptom burden, even with Hospice.   Despite sharing his preference with me in private, it is clear he will not express his preference to his family. After this long discussion, Mr Sarr expressed an inability to make the decision and asked that his children decide for him.  They were very tearful and overwhelmed by this, and they wanted a chance to speak with him alone. I'll plan to re-meet with them in about an hour, per their request.   ADDENDUM 1430-1530: After extensive conversations, Mr. Seres and his family expressed the desire to continue full aggressive care up to DNR/DNI status. He does not want to focus on comfort or is accepting of end of life, but rather wants to continue to pursue interventions intended to improve his situation. I expressed my strong reservations that, despite all interventions and medications, he continues to decline. I am concerned we are prolonging his life without improving his situation, which is akin to prolonging his suffering (I shared this with him and his family). I  also discussed  the difficulty in managing his increasing symptoms and declining clinical status. Specifically, I discussed the difficulty in treating his dyspnea in the setting of hypotension, and treating the hypotension with low albumin and evidence of  third spacing. Despite these things, he and his family continued to express the desire for full aggressive care--up to the point of cardiac or respiratory failure.   Recommendations/Plan:  DNR, otherwise full aggressive care. Pt and family are unrealistic about capacity for improvement, however I think they will not change course until he is either unresponsive or imminently dying. I will follow along closely and continue to try to support the pt and his family as this continues to play out.   Goals of Care and Additional Recommendations:  Limitations on Scope of Treatment: Full Scope Treatment  Code Status:  DNR  Prognosis:   < 2 weeks  Discharge Planning:  To Be Determined  Care plan was discussed with pt, family, care nurse, primary team.   Thank you for allowing the Palliative Medicine Team to assist in the care of this patient.  Time in/out: 1035/1055; 1300/1330; 1430/1530 Total time: 150 minutes    Greater than 50%  of this time was spent counseling and coordinating care related to the above assessment and plan.  Charlynn Court, NP Palliative Medicine Team [phone] pager (7a-5p) Team Phone # [phone]

## 2017-03-06 NOTE — Care Management Important Message (Signed)
Important Message  Patient Details  Name: Richard [name] MRN: [id number] Date of Birth: 09/14/1935   Medicare Important Message Given:  Yes Patient signed on 03/03/2017    Orbie Pyo 03/06/2017, 8:57 AM

## 2017-03-06 NOTE — Progress Notes (Signed)
At rounding, pt. Appeared tachypneic, labored in breathing, sats in 80's on 6 L [place], appeared to be in respiratory distress. After assessment ordered a venturi mask, called rapid response and increased flow to 55%. MD was notified and said she was consulting with Palliative NP. Rapid response RN helped reposition, provided mouth care and suctioning to clear secretions. Pts. sats improved to 93% after these interventions. Will continue to monitor this patient.

## 2017-03-06 NOTE — Progress Notes (Signed)
PT placed on BiPAP.  16/6, 40%.  Pt tolerating well at this time.  Preparing for transfer to step down.

## 2017-03-06 NOTE — Progress Notes (Signed)
Pt. BP 81/50, MD notified and Rapid Response called. Will continue to monitor.

## 2017-03-06 NOTE — Progress Notes (Signed)
Patient ID: Richard [name], male   DOB: 25-Aug-1935, 81 y.o.   MRN: [id number]  PROGRESS NOTE    Richard [name]  HDQ:[id number] DOB: September 16, 1935 DOA: 02/08/2017  PCP: [name], MD   Brief Narrative:  81 year old male with Wegener's granulomatosis, PUD, esophagitis, hypertension, COPD, CAD and stent placement who presented to ED with shortness of breath, cough and also difficulty swallowing. Pt had pulmonary, GI and ENT evaluation. He is not a candidate for aggressive therapy based on his poor resp status and pulmonary recommended to start a palliative care discusison. In the interim he has developed A-fib with RVR, acute thrombocytopenia and is eating poorly   Subjective: States he wants to get better. Son at bedside answers most of the questions for patient. Cough and breathing have, apparently,  not improved. The patient has declined to get out of bed with PT for 5 days in a row and PT has signed off.   Assessment & Plan:   Active Problems: Acute respiratory failure with hypoxia (HCC) / Wegener's granulomatosis ([place]) / Obstructive bronchiectasis (HCC) / Aspiration pneumonitis  - Per pulmonary, patient has end-stage bronchiectasis and pulmonary has signed off - Continue current nebulizer treatments, Brovana and Pulmicort nebulizers twice daily - has been on solumedrol 60 mg IV daily since 7/19 - Cefepime and vanc started on 7/23  has not seemed to have helped his respiratory status to improve  - has just received Morphine this AM for air hunger by palliative care but on further discussion with patient and wife, aggressive care is expected - will start BiPAP and obtain stat CXR- may have aspirated  - transfer to SDU  Hypotensive with A-fib with RVR - converted to A fib just this afternoon - give 1 L NS bolus and Cardizem infusion now- family does not want pressors -  avaiod heparin infusion because of low platelets (77) - transfuse 1 U PRBC for Hb of 7 in setting of A-fib  with RVR  FTT (failure to thrive) in adult / Protein-calorie malnutrition, severe ([place]) - In the context of chronic illness - he is barely eating his meals - per palliative care NP, family considering PEG as he is not eating well  Anemia of chronic disease  - anemia panel consistent with AOCD on 6/27 -  subcutaneous heparin stopped 02/26/2017 due to thrombocytopenia which is acute this admission- nadir 57 - transfuse 1 L PRBC today  Acute Thrombocytopenia - cause? - may be Pepcid -will d/c today  Dyslipidemia - Continue Zocor   Essential hypertension - Continue metoprolol   Depression - Continue Zyprexa, PAxil  BPH without urinary retention - Continue Proscar and Flomax   PAF (paroxysmal atrial fibrillation) (HCC) - CHADS vasc score 2 - Continue aspirin - Continue metoprolol for HR control   CAD s/p stent placement - Continue aspirin   Sacral pressure skin injury, stage 1 - Per RN care   DVT prophylaxis: SCD's Code Status:DNR/DNI Family Communication: son at the bedside  Disposition Plan: transfer to SDU for A-fib with RVR, hypotension and respiratory distress   Consultants:   PT  CCM  SLP  Palliative care  ENT  GI  Cardiology  Procedures:   None   Antimicrobials:  Anti-infectives    Start     Dose/Rate Route Frequency Ordered Stop   03/02/17 1600  vancomycin (VANCOCIN) IVPB 1000 mg/200 mL premix     1,000 mg 200 mL/hr over 60 Minutes Intravenous Every 24 hours 03/02/17 1444  02/28/17 1300  vancomycin (VANCOCIN) 500 mg in sodium chloride 0.9 % 100 mL IVPB  Status:  Discontinued     500 mg 100 mL/hr over 60 Minutes Intravenous Every 24 hours 02/27/17 1107 03/02/17 1444   02/27/17 1200  vancomycin (VANCOCIN) IVPB 750 mg/150 ml premix     750 mg 150 mL/hr over 60 Minutes Intravenous  Once 02/27/17 1107 02/27/17 1606   02/27/17 1200  ceFEPIme (MAXIPIME) 1 g in dextrose 5 % 50 mL IVPB     1 g 100 mL/hr over 30  Minutes Intravenous Every 24 hours 02/27/17 1107     02/23/17 1500  levofloxacin (LEVAQUIN) IVPB 500 mg     500 mg 100 mL/hr over 60 Minutes Intravenous Every 48 hours 03/05/2017 1402 02/25/17 1652   02/22/2017 1415  levofloxacin (LEVAQUIN) IVPB 750 mg  Status:  Discontinued     750 mg 100 mL/hr over 90 Minutes Intravenous  Once 02/08/2017 1402 02/27/17 1005         Objective: Vitals:   03/06/17 0925 03/06/17 1013 03/06/17 1430 03/06/17 1459  BP:  (!) 108/59  (!) 81/50  Pulse:  (!) 111 85   Resp:   20   Temp:   97.8 F (36.6 C)   TempSrc:   Oral   SpO2: (!) 86% 93% 90%   Weight:      Height:        Intake/Output Summary (Last 24 hours) at 03/06/17 1517 Last data filed at 03/06/17 6269  Gross per 24 hour  Intake                0 ml  Output             1150 ml  Net            -1150 ml   Filed Weights   02/06/2017 1021 03/05/2017 1643  Weight: 43.1 kg (95 lb) 40.8 kg (89 lb 14.4 oz)    Examination:  General exam:  Alert - appears weak/ cachectic  Respiratory system: mild rhonchi bilaterally- RR in 30s Cardiovascular system: S1 & S2 heard, IIRR Gastrointestinal system: Abdomen is nondistended, soft and nontender. No organomegaly or masses felt. Normal bowel sounds heard. Central nervous system: No focal neurological deficits.   Extremities: No swelling, palpable pulses  Skin: No rashes, lesions or ulcers Psychiatry: Normal mood and behavior    Data Reviewed: I have personally reviewed following labs and imaging studies  CBC:  Recent Labs Lab 03/02/17 0312 03/03/17 0351 03/04/17 0428 03/05/17 0355 03/06/17 0433  WBC 6.6 5.6 5.8 5.2 9.3  HGB 7.5* 8.0* 7.7* 7.1* 7.6*  HCT 23.9* 26.0* 25.0* 23.3* 24.3*  MCV 109.1* 110.2* 110.1* 112.0* 109.5*  PLT 57* 58* 62* 58* 77*   Basic Metabolic Panel:  Recent Labs Lab 03/02/17 0312 03/03/17 0351 03/04/17 0428 03/05/17 0355 03/06/17 0433 03/06/17 0805  NA  --  149* 148* 148* 147*  --   K  --  3.5 3.3* 3.8 3.6  --   CL   --  110 110 108 105  --   CO2  --  33* 34* 36* 36*  --   GLUCOSE  --  153* 125* 141* 109*  --   BUN  --  25* 21* 22* 27*  --   CREATININE 1.02 0.98 0.97 0.97 1.09  --   CALCIUM  --  7.9* 7.8* 7.8* 8.0*  --   MG  --   --   --   --   --  1.8   GFR: Estimated Creatinine Clearance: 32.2 mL/min (by C-G formula based on SCr of 1.09 mg/dL). Liver Function Tests: No results for input(s): AST, ALT, ALKPHOS, BILITOT, PROT, ALBUMIN in the last 168 hours. No results for input(s): LIPASE, AMYLASE in the last 168 hours. No results for input(s): AMMONIA in the last 168 hours. Coagulation Profile: No results for input(s): INR, PROTIME in the last 168 hours. Cardiac Enzymes: No results for input(s): CKTOTAL, CKMB, CKMBINDEX, TROPONINI in the last 168 hours. BNP (last 3 results) No results for input(s): PROBNP in the last 8760 hours. HbA1C: No results for input(s): HGBA1C in the last 72 hours. CBG: No results for input(s): GLUCAP in the last 168 hours. Lipid Profile: No results for input(s): CHOL, HDL, LDLCALC, TRIG, CHOLHDL, LDLDIRECT in the last 72 hours. Thyroid Function Tests: No results for input(s): TSH, T4TOTAL, FREET4, T3FREE, THYROIDAB in the last 72 hours. Anemia Panel: No results for input(s): VITAMINB12, FOLATE, FERRITIN, TIBC, IRON, RETICCTPCT in the last 72 hours. Urine analysis:    Component Value Date/Time   COLORURINE YELLOW 03/06/2017 1309   APPEARANCEUR CLEAR 03/05/2017 1309   LABSPEC 1.016 02/11/2017 1309   PHURINE 6.0 02/07/2017 1309   GLUCOSEU NEGATIVE 02/14/2017 1309   GLUCOSEU NEGATIVE 07/26/2013 1142   HGBUR NEGATIVE 02/22/2017 1309   HGBUR trace-intact 11/13/2008 0000   BILIRUBINUR NEGATIVE 02/20/2017 1309   BILIRUBINUR neg 05/16/2012 1300   KETONESUR 5 (A) 02/20/2017 1309   PROTEINUR NEGATIVE 03/04/2017 1309   UROBILINOGEN 0.2 03/25/2014 1637   NITRITE NEGATIVE 02/16/2017 1309   LEUKOCYTESUR NEGATIVE 02/09/2017 1309   Sepsis  Labs: @LABRCNTIP (procalcitonin:4,lacticidven:4)   ) Recent Results (from the past 240 hour(s))  Culture, respiratory (NON-Expectorated)     Status: None   Collection Time: 02/25/17  4:03 PM  Result Value Ref Range Status   Specimen Description INDUCED SPUTUM  Final   Special Requests NONE  Final   Gram Stain   Final    ABUNDANT WBC PRESENT,BOTH PMN AND MONONUCLEAR FEW SQUAMOUS EPITHELIAL CELLS PRESENT MODERATE GRAM NEGATIVE RODS MODERATE GRAM POSITIVE COCCI IN CHAINS IN CLUSTERS FEW GRAM POSITIVE RODS RARE BUDDING YEAST SEEN    Culture Consistent with normal respiratory flora.  Final   Report Status 02/28/2017 FINAL  Final      Radiology Studies: No results found.      Scheduled Meds: . arformoterol  15 mcg Nebulization BID  . aspirin EC  81 mg Oral Daily  . budesonide (PULMICORT) nebulizer solution  0.25 mg Nebulization BID  . calcitRIOL  0.25 mcg Oral Daily  . famotidine  20 mg Oral QHS  . feeding supplement  1 Container Oral TID BM  . finasteride  5 mg Oral Daily  . glycopyrrolate  0.2 mg Intravenous Q6H  . mouth rinse  15 mL Mouth Rinse BID  . methylPREDNISolone (SOLU-MEDROL) injection  60 mg Intravenous Daily  . metoprolol succinate  25 mg Oral Daily  . OLANZapine zydis  5 mg Oral QHS  . pantoprazole  40 mg Oral QAC breakfast  . PARoxetine  20 mg Oral Daily  . potassium chloride  40 mEq Oral Once  . psyllium  1 packet Oral Daily  . senna  1 tablet Oral BID  . simvastatin  20 mg Oral q1800  . tamsulosin  0.4 mg Oral Daily   Continuous Infusions: . sodium chloride    . ceFEPime (MAXIPIME) IV Stopped (03/06/17 1328)  . diltiazem (CARDIZEM) infusion    . magnesium sulfate 1 - 4 g  bolus IVPB 2 g (03/06/17 1424)  . sodium chloride    . vancomycin Stopped (03/05/17 1713)     LOS: 13 days    Time spent: 35 minutes  Greater than 50% of the time spent on counseling and coordinating the care.   Debbe Odea, MD [facility] Pager  [phone]  If 7PM-7AM, please contact night-coverage [email or web address] Password [email or web address] 03/06/2017, 3:17 PM

## 2017-03-07 ENCOUNTER — Telehealth: Payer: Self-pay | Admitting: Internal Medicine

## 2017-03-07 DIAGNOSIS — Z515 Encounter for palliative care: Secondary | ICD-10-CM

## 2017-03-07 LAB — TYPE AND SCREEN
ABO/RH(D): O POS
ANTIBODY SCREEN: NEGATIVE
UNIT DIVISION: 0

## 2017-03-07 LAB — BASIC METABOLIC PANEL
Anion gap: 7 (ref 5–15)
BUN: 29 mg/dL — AB (ref 6–20)
CHLORIDE: 104 mmol/L (ref 101–111)
CO2: 33 mmol/L — AB (ref 22–32)
Calcium: 7.8 mg/dL — ABNORMAL LOW (ref 8.9–10.3)
Creatinine, Ser: 1.28 mg/dL — ABNORMAL HIGH (ref 0.61–1.24)
GFR calc Af Amer: >60 mL/min (ref >60)
GFR calc non Af Amer: 52 mL/min — ABNORMAL LOW (ref >60)
GLUCOSE: 102 mg/dL — AB (ref 65–99)
POTASSIUM: 3.4 mmol/L — AB (ref 3.5–5.1)
Sodium: 144 mmol/L (ref 135–145)

## 2017-03-07 LAB — CBC
HEMATOCRIT: 30.9 % — AB (ref 39.0–52.0)
Hemoglobin: 9.8 g/dL — ABNORMAL LOW (ref 13.0–17.0)
MCH: 32.9 pg (ref 26.0–34.0)
MCHC: 31.7 g/dL (ref 30.0–36.0)
MCV: 103.7 fL — AB (ref 78.0–100.0)
Platelets: 64 10*3/uL — ABNORMAL LOW (ref 150–400)
RBC: 2.98 MIL/uL — ABNORMAL LOW (ref 4.22–5.81)
RDW: 19.2 % — ABNORMAL HIGH (ref 11.5–15.5)
WBC: 8.4 10*3/uL (ref 4.0–10.5)

## 2017-03-07 LAB — BPAM RBC
BLOOD PRODUCT EXPIRATION DATE: 201808292359
ISSUE DATE / TIME: 201807301958
UNIT TYPE AND RH: 5100

## 2017-03-07 LAB — MAGNESIUM: Magnesium: 2.1 mg/dL (ref 1.7–2.4)

## 2017-03-07 MED ORDER — POLYVINYL ALCOHOL 1.4 % OP SOLN
1.0000 [drp] | Freq: Four times a day (QID) | OPHTHALMIC | Status: DC | PRN
  Filled 2017-03-07: qty 15

## 2017-03-07 MED ORDER — MORPHINE SULFATE (PF) 2 MG/ML IV SOLN
1.0000 mg | INTRAVENOUS | Status: DC | PRN
  Administered 2017-03-07: 1 mg via INTRAVENOUS

## 2017-03-07 MED ORDER — HALOPERIDOL LACTATE 5 MG/ML IJ SOLN: 0.5000 mg | INTRAMUSCULAR | Status: DC | PRN

## 2017-03-07 MED ORDER — MORPHINE SULFATE (PF) 2 MG/ML IV SOLN
INTRAVENOUS | Status: AC
  Filled 2017-03-07: qty 1

## 2017-03-07 MED ORDER — METOPROLOL TARTRATE 5 MG/5ML IV SOLN: 2.5000 mg | Freq: Three times a day (TID) | INTRAVENOUS | Status: DC

## 2017-03-07 MED ORDER — BIOTENE DRY MOUTH MT LIQD: 15.0000 mL | OROMUCOSAL | Status: DC | PRN

## 2017-03-07 MED ORDER — POTASSIUM CHLORIDE 10 MEQ/100ML IV SOLN
10.0000 meq | INTRAVENOUS | Status: DC
  Administered 2017-03-07 (×3): 10 meq via INTRAVENOUS
  Filled 2017-03-07 (×4): qty 100

## 2017-03-07 MED ORDER — METOPROLOL TARTRATE 5 MG/5ML IV SOLN
2.5000 mg | Freq: Three times a day (TID) | INTRAVENOUS | Status: DC
  Administered 2017-03-07: 2.5 mg via INTRAVENOUS
  Filled 2017-03-07: qty 5

## 2017-03-08 ENCOUNTER — Ambulatory Visit: Payer: Medicare Other | Admitting: Student

## 2017-03-08 NOTE — Progress Notes (Signed)
Family at bedside, pronounced dead at 1615hr by Chat RN/whitney RN.

## 2017-03-08 NOTE — Care Management Note (Signed)
Case Management Note  Patient Details  Name: MAT STUARD MRN: [id number] Date of Birth: Oct 09, 1935  Subjective/Objective:    Pt admitted with                 Action/Plan:  PTA from home with wife - wife/family prepare meals.  Pt has not been eating at home and mobility has also recent declined.  Pt has walker and cane in the home.  PT evaluated pt and are recommending HHPT - choice given to wife - [facility] chosen  [facility] will be arranged once order is received.  CM requested pulm ambulatory note via charge nurse and directly with attending.   Pt has PCP and is able to afford medications as prescribed   Expected Discharge Date:                  Expected Discharge Plan:  [place]  In-House Referral:     Discharge planning Services  CM Consult  Post Acute Care Choice:    Choice offered to:  Patient  DME Arranged:    DME Agency:     HH Arranged:  PT, OT HH Agency:  [facility]  Status of Service:     If discussed at [place] of Stay Meetings, dates discussed:    Additional Comments: Mar 14, 2017 Discussed in LOS 2017/03/14.  Per attending;  despite all treatments, he is clearly getting worse, continues to barely eat, on IV steroids and does not have energy to cough up sputum, is likely aspirating on his own saliva and the little that he is eating.  Palliative following and recommend comfort care. Maryclare Labrador, RN March 14, 2017, 2:27 PM

## 2017-03-08 NOTE — Progress Notes (Signed)
Responded to page to support family. Pt is deceased. Family Pastor involved. Provided emotional and grief support.

## 2017-03-08 NOTE — Progress Notes (Signed)
BP- 40 systolic, on agonal respiration,  Palliative NP at bedside with orders. Morphine 1 mg iv given. Rubinal iv given, Bipap d/ced and placed on o2 2lnc. Monitor d/ced. Family at bedside . Continue to monitor.

## 2017-03-08 NOTE — Progress Notes (Signed)
Daily Progress Note   Patient Name: Richard [name]       Date: 2017-03-13 DOB: 1936/02/26  Age: 81 y.o. MRN#: [id number] Attending Physician: Debbe Odea, MD Primary Care Physician: [name], MD Admit Date: 02/15/2017  Reason for Consultation/Follow-up: Establishing goals of care, Non pain symptom management and Psychosocial/spiritual support  Subjective: Richard [name] looks worn out and exhausted this morning. He was transferred to step down yesterday afternoon, after he had ongoing hypotension, increased work of breathing and the decision to continue full scope aggressive treatment up to DNR/DNI status. He has been on BiPAP, and apparently struggling with the alarms and pressure from the mask. While he woke briefly when I was in the room, he quickly fell back asleep. Per his son and daughter at the bedside, he slept only 3-4 hours last night.   Length of Stay: 14  Current Medications: Scheduled Meds:  . arformoterol  15 mcg Nebulization BID  . aspirin EC  81 mg Oral Daily  . budesonide (PULMICORT) nebulizer solution  0.25 mg Nebulization BID  . calcitRIOL  0.25 mcg Oral Daily  . feeding supplement  1 Container Oral TID BM  . finasteride  5 mg Oral Daily  . glycopyrrolate  0.2 mg Intravenous Q6H  . mouth rinse  15 mL Mouth Rinse BID  . methylPREDNISolone (SOLU-MEDROL) injection  60 mg Intravenous Daily  . metoprolol succinate  25 mg Oral Daily  . OLANZapine zydis  5 mg Oral QHS  . pantoprazole  40 mg Oral QAC breakfast  . PARoxetine  20 mg Oral Daily  . potassium chloride  40 mEq Oral Once  . psyllium  1 packet Oral Daily  . senna  1 tablet Oral BID  . simvastatin  20 mg Oral q1800  . tamsulosin  0.4 mg Oral Daily    Continuous Infusions: . sodium chloride    . ceFEPime (MAXIPIME) IV Stopped (03/06/17 1328)  .  vancomycin 1,000 mg (03/06/17 1700)    PRN Meds: acetaminophen **OR** acetaminophen, albuterol, benzonatate, gi cocktail, guaiFENesin, lidocaine, lidocaine, lidocaine-EPINEPHrine, nitroGLYCERIN, ondansetron **OR** ondansetron (ZOFRAN) IV, oxymetazoline, silver nitrate applicators, simethicone, TRIPLE ANTIBIOTIC  Physical Exam    Constitutional: He appears cachectic. He has a sickly appearance.  Lethargic, unable to ask orientation questions due to [place], BiPAP, and lethargy.   HENT:  Head: Normocephalic and atraumatic.  Mouth/Throat: Mucous membranes are dry.  Eyes: EOM are normal.  Neck: Normal range of motion. Neck supple.  Cardiovascular: Tachycardic. An irregularly irregular rhythm present.  Murmur heard. Pulmonary/Chest: Accessory muscle usage present. He has decreased breath sounds in the right lower field and the left lower field.He has rhonchi (scattered).  BiPAP in place. RR improved compared to yesterday. Less WOB. Abdominal: Soft. Bowel sounds are normal. No abdominal tenderness. Musculoskeletal: Normal range of motion. He exhibits no edema.  Generalized weakness, minimally moving self in bed.  Neurological:  Lethargic, unable to assess mentation.  Skin: Skin is warm and dry. Bruising noted. There is pallor.  Psychiatric:  UTA, to lethargic to engage.   Vital Signs: BP 91/62   Pulse (!) 116   Temp 97.8 F (36.6 C) (Axillary)   Resp 15   Ht 5\' 7"  (  1.702 m)   Wt 40.8 kg (89 lb 14.4 oz)   SpO2 99%   BMI 14.08 kg/m  SpO2: SpO2: 99 % O2 Device: O2 Device: Bi-PAP O2 Flow Rate: O2 Flow Rate (L/min): 14 L/min  Intake/output summary:   Intake/Output Summary (Last 24 hours) at 2017-03-31 0849 Last data filed at 03/06/17 2320  Gross per 24 hour  Intake              630 ml  Output              300 ml  Net              330 ml   LBM: Last BM Date: 03/03/17 Baseline Weight: Weight: 43.1 kg (95 lb) Most recent weight: Weight: 40.8 kg (89 lb 14.4 oz)   Palliative  Assessment/Data: PPS 20%   Flowsheet Rows     Most Recent Value  Intake Tab  Referral Department  Hospitalist  Unit at Time of Referral  Med/Surg Unit  Palliative Care Primary Diagnosis  Pulmonary  Date Notified  02/27/17  Palliative Care Type  New Palliative care  Reason for referral  Clarify Goals of Care  Date of Admission  02/14/2017  Date first seen by Palliative Care  02/28/17  # of days Palliative referral response time  1 Day(s)  # of days IP prior to Palliative referral  6  Clinical Assessment  Psychosocial & Spiritual Assessment  Palliative Care Outcomes      Patient Active Problem List   Diagnosis Date Noted  . Palliative care by specialist   . Goals of care, counseling/discussion   . Pressure injury of skin 02/22/2017  . PAF (paroxysmal atrial fibrillation) ([place]) 02/22/2017  . Acute respiratory failure with hypoxia ([place]) 02/16/2017  . Chest pressure 02/17/2017  . FTT (failure to thrive) in adult 02/19/2017  . Anemia, chronic disease   . Dehydration   . HCAP (healthcare-associated pneumonia)   . Sepsis ([place]) 01/05/2017  . Lobar pneumonia ([place]) 01/05/2017  . Compression fracture of thoracic spine, non-traumatic ([place]) 01/05/2017  . AKI (acute kidney injury) ([place]) 01/05/2017  . Hypotension 01/05/2017  . CAD in native artery 11/14/2016  . Melena 11/01/2016  . Malnutrition of moderate degree 08/22/2016  . Influenza A 08/21/2016  . AVM (arteriovenous malformation) of colon with hemorrhage   . Symptomatic anemia 07/30/2016  . Gastrointestinal hemorrhage with melena 07/30/2016  . Aortic valve stenosis 07/08/2016  . Protein-calorie malnutrition, severe ([place]) 04/21/2015  . CAP (community acquired pneumonia) 04/18/2015  . Chronic kidney disease, stage IV (severe) ([place]) 03/26/2015  . GERD (gastroesophageal reflux disease) 08/20/2014  . Wegener's granulomatosis with renal involvement ([place]) 07/15/2014  . Pseudoaneurysm of right femoral artery ([place]) 04/08/2014  . Acute  blood loss anemia 03/20/2014  . CAD- severe 4 V CAD- not CABG candidate: BMS-pLAD, mRCA &PTCA of 100% Cx-OM 03/17/2014  . Anemia 03/17/2014  . CKD (chronic kidney disease), stage III 03/17/2014  . Cardiomyopathy, ischemic- EF 40% by [facility] 03/17/2014  . Dyspnea 03/13/2014  . Abnormal nuclear stress test: Intermediate risk with moderate region of apical and inferior scar with mild superimposed ischemia; EF 40% 03/12/2014  . Loss of weight 12/12/2013  . Abdominal pain, unspecified site 12/12/2013  . SVT (supraventricular tachycardia) ([place]) 10/04/2013  . SOB (shortness of breath) 10/04/2013  . Weakness generalized 10/04/2013  . Hypertension 09/28/2013  . Chronic rhinitis 09/27/2013  . Chronic respiratory failure ([place]) 12/26/2012  . Bronchiectasis with acute exacerbation ([place]) 09/28/2011  . Dysphagia 10/25/2010  .  Wegener's granulomatosis ([place]) 05/10/2010  . GERD 03/10/2010  . ARTHRITIS, CERVICAL SPINE 03/10/2010  . Hypothyroidism 11/13/2008  . VITAMIN D DEFICIENCY 11/13/2008  . Hyperlipidemia with target low density lipoprotein (LDL) cholesterol less than 70 mg/dL 11/13/2008  . ANEMIA 11/13/2008  . BPH (benign prostatic hyperplasia) 12/04/2007  . Obstructive bronchiectasis ([place]) 01/25/2007    Palliative Care Assessment & Plan   HPI: 81 y.o. male  with past medical history of obstructive bronchiectasis then diagnosed with Wegener's Granulomatosis with renal and pulmonary involvement (initial diagnosis 2011), COPD, CAD s/p cardiac cath and stent placement, HTN, dysphagia, PUD, and esophagitis. Pt presented to the ED with multiple medical issues, which included worsening dyspnea on exertion, chest pain, and appetite loss with progressive weakness and fatigue. He was seen by Pulmonology one day prior to admission. He was admitted on 03/01/2017 for acute on chronic hypoxic respiratory failure secondary to acute exacerbation of obstructive bronchiectasis, which is complicated by FTT from  chronic dysphagia and poor oral intake. GI consulted for dysphagia and identified a cricopharyngeal dysfunction with global GI dysmotility; they have little to offer to help with this and are suggesting Palliative. Pulmonology also following but are concerned that he is approaching the end of his life. Per GI notes: "He has then been in a progressive cycle of pain [thoracic spine fx 2 mo ago], poor nutrition, recurrent pulmonary infection, weakness and greater difficulty swallowing."   Assessment: I have had multiple meetings with Mr. Glasheen and his family, starting on 7/25. In these meetings we have talked about his perceptions of his overall health trajectory and his acute health issues. We have also had numerous conversations about how to manage his acute issues and the associated burdens, benefits, and understandings with these care options. Mr. Atkison is able to clearly verbalize a declining health trend, and that his acute issues have not improved (and in some ways worsened) since admission. On 7/30 we had another large family meeting to again address the options for care moving forward. This was especially relevant as his respiratory status was worsening and he was persistently hypotensive. Despite privately expressing the desire to proceed with Hospice care, he talked with his family at length and decided that he does not want to focus on comfort nor is accepting of end of life, but rather wants to continue to pursue interventions intended to improve his situation. I did express strong reservation in continuing to pursue aggressive interventions, based on our conversations about his desire for comfort and not wanting a period of prolonged suffering. Despite my expressed reservations and recommendations against this path, he was clear in wanting to pursue all aggressive measures up to cardiac or respiratory failure. His family supported him in this decision.  I followed-up today to check-in with Mr.  Sek and his family. He had a reportedly rough night due to the BiPAP. When I arrived to the room he briefly woke and acknowledged me, but then quickly fell back asleep. His son and one daughter were at the bedside and they were clearly concerned about his lethargy/fatigue, but continued to verbalize the desire for aggressive treatment to help "get him through this." I again reiterated that there are always options for how we care for someone, and it was never unreasonable to change course if the aggressive path is creating more burden than benefit.   Recommendations/Plan:  DNR/DNI, otherwise full aggressive care. I will follow along closely and continue to try to support the pt and his family as this continues  to play out. I do believe the pt and family are unrealistic about capacity for improvement, however I am concerned they will not change course until he is either unresponsive or imminently dying.   ADDENDUM 1550: I arrived to the room to check on Mr. Diekman and found him with a significant change in his stability and care nurse and charge nurse at the bedside evaluating. Just prior to my arrival he was found to be tachycardic up to 150, with severe hypotension, agonal respirations, and unresponsive. I talked with his family at the bedside and shared that escalation at this point (such as with fluid boluses or pressors) would not be appropriate given his wishes not to have prolonged suffering. With their permission, I want to now focus on his comfort and allow him to die peacefully, comfortably, and with dignity. His wife and family agreed. I talked them through comfort measures, as well as what to expect with the dying process.   DNR, full comfort measures  BiPAP removed and cardiac monitoring stopped. Pt placed on 2L O2 via [employer]  Medications adjusted for comfort. At this point he does not appear to need a morphine drip, but I would have a low threshold for starting one  Expected hospital  death in minutes to hours  Goals of Care and Additional Recommendations:  Limitations on Scope of Treatment: Full Comfort Care and Full Scope Treatment  Code Status:  DNR  Prognosis:   Minutes to hours  Discharge Planning:  Anticipated Hospital Death  Care plan was discussed with pt, family, care nurse, charge nurse.   Thank you for allowing the Palliative Medicine Team to assist in the care of this patient.  Time in/out: 0830/0845;1550/1620 Total time: 75 minutes    Greater than 50%  of this time was spent counseling and coordinating care related to the above assessment and plan.  Charlynn Court, NP Palliative Medicine Team [phone] pager (7a-5p) Team Phone # [phone]

## 2017-03-08 NOTE — Progress Notes (Addendum)
Patient ID: Richard [name], male   DOB: 04/13/1936, 81 y.o.   MRN: [id number]  PROGRESS NOTE    Richard [name]  FYB:[id number] DOB: 1936/01/31 DOA: 03/02/2017  PCP: [name], MD   Brief Narrative:  81 year old male with Wegener's granulomatosis, PUD, esophagitis, hypertension, COPD, CAD and stent placement who presented to ED with shortness of breath, cough and also difficulty swallowing. Pt had pulmonary, GI and ENT evaluation. He is not a candidate for aggressive therapy based on his poor resp status and pulmonary recommended to start a palliative care discusison. In the interim he has developed A-fib with RVR, acute thrombocytopenia and is eating poorly States he wants to get better. Son at bedside answers most of the questions for patient. Cough and breathing have, apparently,  not improved. The patient has declined to get out of bed with PT for 5 days in a row and PT has signed off.   Subjective:   I am giving him a break from the [place]. He is poorly communicative today. Breathing heavily still and not as awake as yesterday. Long conversation with his daughter today about stopping current care which is futile. She states that he is the one who states that "he wants to get better". Explained that he has been in the hospital for about 2 wks now and despite all treatments, he is clearly getting worse, continues to barely eat, does not have energy to cough up sputum, is likely aspirating on his own saliva and the little that he is eating. We dicussed NT suctioning to suction out mucous as he is unable to cough it up and also discussed the risk for trauma with this. Very little else to offer at this point. The BiPAP is only a temporizing measure because we are unable to correct his underlying pathology. The A-fib is a result of his respiratory distress.   Assessment & Plan:   Active Problems: Acute respiratory failure with hypoxia (HCC) / Wegener's granulomatosis ([place]) / Obstructive  bronchiectasis (HCC) / Aspiration pneumonitis  - Per pulmonary, patient has end-stage bronchiectasis and pulmonary has signed off - Continue current nebulizer treatments, Brovana and Pulmicort nebulizers twice daily - has been on solumedrol 60 mg IV daily since 7/19 - Cefepime and vanc started on 7/23  has not seemed to have helped his respiratory status to improve  - has just received Morphine this AM for air hunger by palliative care but on further discussion with patient and wife, aggressive care is expected - 7/30-  started BiPAP and obtain stat CXR as I suspected that he may have aspirated - CXR showed increasing RLL infiltrate  - transfered to SDU - strongly recommend comfort care which I discussed with his daughter today  Severe weakness - have had to hold all oral meds today as he will not be able to swallow  Hypotensive with PA-fib with RVR - 7/30 >> converted to A fib and noted to be hypotensive- no h/o A-fib- likely is related to respiratory distress - give 1 L NS bolus>>  rate converted and BP improved - family does not want pressors -  avoid heparin infusion because of low platelets (77) - transfused 1 U PRBC for Hb of 7 in setting of A-fib with RVR  FTT (failure to thrive) in adult / Protein-calorie malnutrition, severe ([facility]) - In the context of chronic illness - he is barely eating his meals for weeks now  Anemia of chronic disease  - anemia panel consistent with  AOCD on 6/27 -  subcutaneous heparin stopped 02/26/2017 due to thrombocytopenia which is acute this admission- nadir 57 - transfuse 1 L PRBC on 7/30  Renal insufficiency - Cr up today despite giving blood yesterday  Acute Thrombocytopenia - cause? - may be Pepcid vs cephalosporins - d/c'd Pepcid on 7/30    Dyslipidemia - Continue Zocor   Essential hypertension - Continue metoprolol   Depression - Continue Zyprexa, PAxil  BPH without urinary retention - Continue Proscar and Flomax     Moderate aortic stenosis and mod pulm HTN  CAD s/p stent placement - Continue aspirin   Sacral pressure skin injury, stage 1 - Per RN care   DVT prophylaxis: SCD's Code Status:DNR/DNI Family Communication: son at the bedside  Disposition Plan: transfer to SDU for A-fib with RVR, hypotension and respiratory distress   Consultants:   PT  CCM  SLP  Palliative care  ENT  GI  Cardiology  Procedures:   None   Antimicrobials:  Anti-infectives    Start     Dose/Rate Route Frequency Ordered Stop   03/02/17 1600  vancomycin (VANCOCIN) IVPB 1000 mg/200 mL premix     1,000 mg 200 mL/hr over 60 Minutes Intravenous Every 24 hours 03/02/17 1444     02/28/17 1300  vancomycin (VANCOCIN) 500 mg in sodium chloride 0.9 % 100 mL IVPB  Status:  Discontinued     500 mg 100 mL/hr over 60 Minutes Intravenous Every 24 hours 02/27/17 1107 03/02/17 1444   02/27/17 1200  vancomycin (VANCOCIN) IVPB 750 mg/150 ml premix     750 mg 150 mL/hr over 60 Minutes Intravenous  Once 02/27/17 1107 02/27/17 1606   02/27/17 1200  ceFEPIme (MAXIPIME) 1 g in dextrose 5 % 50 mL IVPB     1 g 100 mL/hr over 30 Minutes Intravenous Every 24 hours 02/27/17 1107     02/23/17 1500  levofloxacin (LEVAQUIN) IVPB 500 mg     500 mg 100 mL/hr over 60 Minutes Intravenous Every 48 hours 02/22/2017 1402 02/25/17 1652   02/20/2017 1415  levofloxacin (LEVAQUIN) IVPB 750 mg  Status:  Discontinued     750 mg 100 mL/hr over 90 Minutes Intravenous  Once 02/26/2017 1402 02/27/17 1005        Objective: Vitals:   Mar 28, 2017 0300 03/28/17 0426 2017-03-28 0742 28-Mar-2017 0744  BP: (!) 93/59  91/62 91/62  Pulse: (!) 118 (!) 129 (!) 116 (!) 116  Resp: 20 14 17 15   Temp: 97.9 F (36.6 C)  97.8 F (36.6 C)   TempSrc: Axillary  Axillary   SpO2: 98% 97% 99%   Weight:      Height:        Intake/Output Summary (Last 24 hours) at 03/28/17 1103 Last data filed at 03/06/17 2320  Gross per 24 hour  Intake               630 ml  Output              300 ml  Net              330 ml   Filed Weights   02/27/2017 1021 02/13/2017 1643  Weight: 43.1 kg (95 lb) 40.8 kg (89 lb 14.4 oz)    Examination: General exam: struggling to breath, awake but not speaking to me- intermittently drifting off to sleep HEENT: PERRLA, oral mucosa very dry, ulcerated lips and tongue   Respiratory system:   Tachypnea, using accessory muscles, abdominal muscles Cardiovascular system: S1 &  S2 heard, tachycardic  murmur at RUS border  Gastrointestinal system: Abdomen soft, non-tender, nondistended. Normal bowel sound. No organomegaly Central nervous system: Alert - difficult to tell if he is oriented. No focal neurological deficits. Extremities: No cyanosis, clubbing or edema Skin: No rashes or ulcers Psychiatry:  non-verbal, appears exhausted.     Data Reviewed: I have personally reviewed following labs and imaging studies  CBC:  Recent Labs Lab 03/03/17 0351 03/04/17 0428 03/05/17 0355 03/06/17 0433 Mar 09, 2017 0316  WBC 5.6 5.8 5.2 9.3 8.4  HGB 8.0* 7.7* 7.1* 7.6* 9.8*  HCT 26.0* 25.0* 23.3* 24.3* 30.9*  MCV 110.2* 110.1* 112.0* 109.5* 103.7*  PLT 58* 62* 58* 77* 64*   Basic Metabolic Panel:  Recent Labs Lab 03/03/17 0351 03/04/17 0428 03/05/17 0355 03/06/17 0433 03/06/17 0805 09-Mar-2017 0316  NA 149* 148* 148* 147*  --  144  K 3.5 3.3* 3.8 3.6  --  3.4*  CL 110 110 108 105  --  104  CO2 33* 34* 36* 36*  --  33*  GLUCOSE 153* 125* 141* 109*  --  102*  BUN 25* 21* 22* 27*  --  29*  CREATININE 0.98 0.97 0.97 1.09  --  1.28*  CALCIUM 7.9* 7.8* 7.8* 8.0*  --  7.8*  MG  --   --   --   --  1.8  --    GFR: Estimated Creatinine Clearance: 27.4 mL/min (A) (by C-G formula based on SCr of 1.28 mg/dL (H)). Liver Function Tests: No results for input(s): AST, ALT, ALKPHOS, BILITOT, PROT, ALBUMIN in the last 168 hours. No results for input(s): LIPASE, AMYLASE in the last 168 hours. No results for input(s): AMMONIA in the  last 168 hours. Coagulation Profile: No results for input(s): INR, PROTIME in the last 168 hours. Cardiac Enzymes: No results for input(s): CKTOTAL, CKMB, CKMBINDEX, TROPONINI in the last 168 hours. BNP (last 3 results) No results for input(s): PROBNP in the last 8760 hours. HbA1C: No results for input(s): HGBA1C in the last 72 hours. CBG: No results for input(s): GLUCAP in the last 168 hours. Lipid Profile: No results for input(s): CHOL, HDL, LDLCALC, TRIG, CHOLHDL, LDLDIRECT in the last 72 hours. Thyroid Function Tests: No results for input(s): TSH, T4TOTAL, FREET4, T3FREE, THYROIDAB in the last 72 hours. Anemia Panel: No results for input(s): VITAMINB12, FOLATE, FERRITIN, TIBC, IRON, RETICCTPCT in the last 72 hours. Urine analysis:    Component Value Date/Time   COLORURINE YELLOW 02/08/2017 1309   APPEARANCEUR CLEAR 02/28/2017 1309   LABSPEC 1.016 03/05/2017 1309   PHURINE 6.0 02/07/2017 1309   GLUCOSEU NEGATIVE 02/15/2017 1309   GLUCOSEU NEGATIVE 07/26/2013 1142   HGBUR NEGATIVE 02/20/2017 1309   HGBUR trace-intact 11/13/2008 0000   BILIRUBINUR NEGATIVE 02/12/2017 1309   BILIRUBINUR neg 05/16/2012 1300   KETONESUR 5 (A) 03/06/2017 1309   PROTEINUR NEGATIVE 02/28/2017 1309   UROBILINOGEN 0.2 03/25/2014 1637   NITRITE NEGATIVE 02/20/2017 1309   LEUKOCYTESUR NEGATIVE 02/19/2017 1309   Sepsis Labs: @LABRCNTIP (procalcitonin:4,lacticidven:4)   ) Recent Results (from the past 240 hour(s))  Culture, respiratory (NON-Expectorated)     Status: None   Collection Time: 02/25/17  4:03 PM  Result Value Ref Range Status   Specimen Description INDUCED SPUTUM  Final   Special Requests NONE  Final   Gram Stain   Final    ABUNDANT WBC PRESENT,BOTH PMN AND MONONUCLEAR FEW SQUAMOUS EPITHELIAL CELLS PRESENT MODERATE GRAM NEGATIVE RODS MODERATE GRAM POSITIVE COCCI IN CHAINS IN CLUSTERS FEW GRAM POSITIVE  RODS RARE BUDDING YEAST SEEN    Culture Consistent with normal respiratory  flora.  Final   Report Status 02/28/2017 FINAL  Final      Radiology Studies: Dg Chest Port 1 View  Result Date: 03/06/2017 CLINICAL DATA:  Respiratory distress EXAM: PORTABLE CHEST 1 VIEW COMPARISON:  Chest radiograph 02/26/2017 FINDINGS: Multifocal, right worse than left, opacities have worsened since the prior examination. Small right pleural effusion is unchanged. There is calcific aortic atherosclerosis. Cardiomediastinal silhouette is unchanged. IMPRESSION: Slight worsening of likely multifocal pneumonia, particularly in the right lung base. Electronically Signed   By: Ulyses Jarred M.D.   On: 03/06/2017 18:59        Scheduled Meds: . arformoterol  15 mcg Nebulization BID  . budesonide (PULMICORT) nebulizer solution  0.25 mg Nebulization BID  . feeding supplement  1 Container Oral TID BM  . glycopyrrolate  0.2 mg Intravenous Q6H  . mouth rinse  15 mL Mouth Rinse BID  . methylPREDNISolone (SOLU-MEDROL) injection  60 mg Intravenous Daily  . metoprolol tartrate  2.5 mg Intravenous Q8H  . potassium chloride  40 mEq Oral Once  . psyllium  1 packet Oral Daily   Continuous Infusions: . sodium chloride    . ceFEPime (MAXIPIME) IV Stopped (03/06/17 1328)  . vancomycin 1,000 mg (03/06/17 1700)     LOS: 14 days    Time spent: 35 minutes  Greater than 50% of the time spent on counseling and coordinating the care.   Debbe Odea, MD [facility] Pager [phone]  If 7PM-7AM, please contact night-coverage [email or web address] Password [email or web address] March 22, 2017, 11:03 AM

## 2017-03-08 NOTE — Progress Notes (Deleted)
Cardiology Office Note    Date:  2017-03-22   ID:  Richard, [name] 06-Aug-1936, MRN [id number]  PCP:  [name], MD  Cardiologist: Dr. Debara Pickett  No chief complaint on file.   History of Present Illness:    Richard [name] is a 81 y.o. male ***    Past Medical History:  Diagnosis Date  . Adenomatous colon polyp   . Anemia   . Asthma   . BPH (benign prostatic hyperplasia)    sees Dr. Risa Grill, biopsy June 2015 was benign   . Bronchiectasis ([place])   . CAD (coronary artery disease)    a. [place] s/p [place] x3 and ultimately BMS to pLAD (vision BMS 3.0 x 18), POBA to CTO of Cx-OM 03/17/14 and staged Rotablator-PCI w/ BMS (Mini Vision 2.5 x 28) to mRCA on 03/21/14 (unsuitable for CABG).  . Chronic bronchitis ([place])   . CKD (chronic kidney disease), stage IV ([place])   . COPD (chronic obstructive pulmonary disease) ([place])   . Diverticulosis   . Femoral artery pseudo-aneurysm, right ([place])    a. s/p repair. Follow up with Dr. Kellie Simmering   . Fungal infection    lungs  . GERD (gastroesophageal reflux disease)   . GI bleed    a. 10/2016 - Plavix stopped.  . Hiatal hernia   . History of blood transfusion 07/2016; 11/01/2016   "low blood; low blood"  . History of kidney stones   . HOH (hard of hearing)    bilaterally  . HTN (hypertension) 09/28/2013  . Ischemic cardiomyopathy    a. Prior EF 40-45% by echo in 2015, improved in 2016.  . Mild mitral regurgitation   . Moderate aortic stenosis by prior echocardiogram   . Peptic stricture of esophagus   . Pneumonia    "1-2 times" (11/01/2016)  . Wegener's granulomatosis ([place])    sees Dr. [name] ; "went from my lungs to my kidneys" (11/01/2016)    Past Surgical History:  Procedure Laterality Date  . CARDIAC CATHETERIZATION  03/17/2014   Procedure: CORONARY BALLOON ANGIOPLASTY;  Surgeon: Troy Sine, MD;  Location: [facility] CATH LAB;  Service: Cardiovascular;;  . CATARACT EXTRACTION W/ INTRAOCULAR LENS  IMPLANT, BILATERAL  12/18/2012  .  CHOLECYSTECTOMY N/A 12/21/2012   Procedure: LAPAROSCOPIC CHOLECYSTECTOMY WITH INTRAOPERATIVE CHOLANGIOGRAM;  Surgeon: Edward Jolly, MD;  Location: [facility] ORS;  Service: General;  Laterality: N/A;  . COLONOSCOPY  08-16-11   per Dr. Fuller Plan, diverticulosis and polyps, repeat in 5 yrs   . COLONOSCOPY N/A 08/01/2016   Procedure: COLONOSCOPY;  Surgeon: Carol Ada, MD;  Location: [facility] ENDOSCOPY;  Service: Endoscopy;  Laterality: N/A;  . ESOPHAGOGASTRODUODENOSCOPY N/A 07/31/2016   Procedure: ESOPHAGOGASTRODUODENOSCOPY (EGD);  Surgeon: Carol Ada, MD;  Location: [facility] ENDOSCOPY;  Service: Endoscopy;  Laterality: N/A;  . ESOPHAGOGASTRODUODENOSCOPY (EGD) WITH ESOPHAGEAL DILATION  11-29-10   per Dr. Fuller Plan   . HEMATOMA EVACUATION Right 03/19/2014   Procedure: Suture repair of femoral artery with evacuation of hematoma;  Surgeon: Mal Misty, MD;  Location: [facility];  Service: Vascular;  Laterality: Right;  . INGUINAL HERNIA REPAIR Right ~ 2002  . LEFT HEART CATHETERIZATION WITH CORONARY ANGIOGRAM N/A 03/14/2014   Procedure: LEFT HEART CATHETERIZATION WITH CORONARY ANGIOGRAM;  Surgeon: Leonie Man, MD;  Location: [facility] CATH LAB: pLAD 90% calcified lesion --> after D1, LAD is Subtotally occluded. mCx 100% (L-L collaterals). pRI 60-80%. mRCA diffuse 80-95%.  Marland Kitchen LUNG BIOPSY  03/2014  . PERCUTANEOUS CORONARY STENT INTERVENTION (PCI-S) N/A  03/17/2014   Procedure: PERCUTANEOUS CORONARY STENT INTERVENTION (PCI-S);  Surgeon: Troy Sine, MD;  Location: [facility] CATH LAB: Vision BMS 3.0 mm x 18 mm -- 3.2 mm); PTCA of CTO Cx-OM  . PERCUTANEOUS CORONARY STENT INTERVENTION (PCI-S) N/A 03/21/2014   Procedure: PERCUTANEOUS CORONARY STENT INTERVENTION (PCI-S);  Surgeon: Burnell Blanks, MD;  Location: [facility] CATH LAB:  Rotational Atherectomy -- Mini Trek BMS 2.5 x 28 mm --> 2.75 mm)  . RENAL BIOPSY    . TRANSTHORACIC ECHOCARDIOGRAM  10/2015; 10/2016   a. Now Moderate AD (AVA ~1.2 cm) EF 60-65%. GR 1 DD.;; b. Stable -- EF 60-65%  with moderate LVH. Only noted grade 1 diastolic dysfunction. Moderate aortic stenosis with moderate calcification and thickening. (Estimated valve area between 1.2-1.3 cm)    Current Medications: Facility-Administered Medications Prior to Visit  Medication Dose Route Frequency Provider Last Rate Last Dose  . 0.9 %  sodium chloride infusion   Intravenous Once Debbe Odea, MD      . acetaminophen (TYLENOL) tablet 650 mg  650 mg Oral Q6H PRN Waldemar Dickens, MD       Or  . acetaminophen (TYLENOL) suppository 650 mg  650 mg Rectal Q6H PRN Waldemar Dickens, MD      . albuterol (PROVENTIL) (2.5 MG/3ML) 0.083% nebulizer solution 2.5 mg  2.5 mg Nebulization Q2H PRN Waldemar Dickens, MD   2.5 mg at 03/01/17 1741  . arformoterol (BROVANA) nebulizer solution 15 mcg  15 mcg Nebulization BID Tanda Rockers, MD   15 mcg at March 30, 2017 4098  . aspirin EC tablet 81 mg  81 mg Oral Daily Waldemar Dickens, MD   Stopped at 03/06/17 [phone]  . benzonatate (TESSALON) capsule 200 mg  200 mg Oral TID PRN Waldemar Dickens, MD   200 mg at 02/10/2017 1931  . budesonide (PULMICORT) nebulizer solution 0.25 mg  0.25 mg Nebulization BID Tanda Rockers, MD   0.25 mg at 2017/03/30 0742  . calcitRIOL (ROCALTROL) capsule 0.25 mcg  0.25 mcg Oral Daily Waldemar Dickens, MD   Stopped at 03/06/17 [phone]  . ceFEPIme (MAXIPIME) 1 g in dextrose 5 % 50 mL IVPB  1 g Intravenous Q24H Patterson [place], [facility]   Stopped at 03/06/17 1328  . feeding supplement (BOOST / RESOURCE BREEZE) liquid 1 Container  1 Container Oral TID BM Velvet Bathe, MD   1 Container at 03/06/17 1424  . finasteride (PROSCAR) tablet 5 mg  5 mg Oral Daily Waldemar Dickens, MD   Stopped at 03/06/17 [phone]  . gi cocktail (Maalox,Lidocaine,Donnatal)  30 mL Oral QID PRN Waldemar Dickens, MD      . glycopyrrolate (ROBINUL) injection 0.2 mg  0.2 mg Intravenous Q6H Jannette Fogo, NP   0.2 mg at 30-Mar-2017 0422  . guaiFENesin (ROBITUSSIN) 100 MG/5ML solution 100 mg  5 mL Oral Q4H  PRN Rush Farmer, MD   100 mg at 03/01/17 1616  . lidocaine (XYLOCAINE) 2 % jelly 1 application  1 application Topical Once PRN Izora Gala, MD      . lidocaine (XYLOCAINE) 4 % external solution 0-50 mL  0-50 mL Topical Once PRN Izora Gala, MD      . lidocaine-EPINEPHrine (XYLOCAINE-EPINEPHrine) 1 %-1:200000 (PF) injection 0-30 mL  0-30 mL Intradermal Once PRN Izora Gala, MD      . MEDLINE mouth rinse  15 mL Mouth Rinse BID Velvet Bathe, MD   15 mL at 03/05/17 2131  . methylPREDNISolone sodium succinate (  SOLU-MEDROL) 125 mg/2 mL injection 60 mg  60 mg Intravenous Daily Velvet Bathe, MD   Stopped at 03/06/17 [phone]  . metoprolol succinate (TOPROL-XL) 24 hr tablet 25 mg  25 mg Oral Daily Pixie Casino, MD   Stopped at 03/06/17 [phone]  . nitroGLYCERIN (NITROSTAT) SL tablet 0.4 mg  0.4 mg Sublingual Q5 Min x 3 PRN Waldemar Dickens, MD      . OLANZapine zydis [facility]) disintegrating tablet 5 mg  5 mg Oral QHS Jannette Fogo, NP   5 mg at 03/05/17 2130  . ondansetron (ZOFRAN) tablet 4 mg  4 mg Oral Q6H PRN Waldemar Dickens, MD   4 mg at 02/23/17 1710   Or  . ondansetron (ZOFRAN) injection 4 mg  4 mg Intravenous Q6H PRN Waldemar Dickens, MD   4 mg at 02/22/17 1138  . oxymetazoline (AFRIN) 0.05 % nasal spray 1 spray  1 spray Each Nare Once PRN Izora Gala, MD      . pantoprazole (PROTONIX) EC tablet 40 mg  40 mg Oral QAC breakfast Tanda Rockers, MD   Stopped at 03/06/17 [phone]  . PARoxetine (PAXIL) tablet 20 mg  20 mg Oral Daily Pixie Casino, MD   Stopped at 03/06/17 [phone]  . potassium chloride SA (K-DUR,KLOR-CON) CR tablet 40 mEq  40 mEq Oral Once Debbe Odea, MD   Stopped at 03/06/17 0950  . psyllium (HYDROCIL/METAMUCIL) packet 1 packet  1 packet Oral Daily Waldemar Dickens, MD   Stopped at 03/06/17 [phone]  . senna (SENOKOT) tablet 8.6 mg  1 tablet Oral BID Jannette Fogo, NP   8.6 mg at 03/05/17 2130  . silver nitrate applicators applicator 1 Stick  1 Stick Topical Once PRN  Izora Gala, MD      . simethicone [facility]) chewable tablet 80 mg  80 mg Oral Q6H PRN Waldemar Dickens, MD      . simvastatin (ZOCOR) tablet 20 mg  20 mg Oral q1800 Waldemar Dickens, MD   20 mg at 03/05/17 1819  . tamsulosin (FLOMAX) capsule 0.4 mg  0.4 mg Oral Daily Velvet Bathe, MD   0.4 mg at 03/05/17 2130  . TRIPLE ANTIBIOTIC [phone] OINT 1 application  1 application Apply externally Once PRN Izora Gala, MD      . vancomycin (VANCOCIN) IVPB 1000 mg/200 mL premix  1,000 mg Intravenous Q24H Leisa Lenz M, MD 200 mL/hr at 03/06/17 1700 1,000 mg at 03/06/17 1700   Outpatient Medications Prior to Visit  Medication Sig Dispense Refill  . acetaminophen (TYLENOL) 500 MG tablet Take 2 tablets (1,000 mg total) by mouth every 8 (eight) hours. (Patient taking differently: Take 1,000 mg by mouth every 8 (eight) hours as needed for mild pain or moderate pain. ) 30 tablet 0  . albuterol (VENTOLIN HFA) 108 (90 Base) MCG/ACT inhaler Inhale 1-2 puffs into the lungs every 4 (four) hours as needed for wheezing or shortness of breath. 1 Inhaler 2  . aspirin EC 81 MG tablet Take 81 mg by mouth daily.    Marland Kitchen b complex vitamins tablet Take 1 tablet by mouth daily.      . benzonatate (TESSALON) 200 MG capsule Take 1 capsule (200 mg total) by mouth 3 (three) times daily as needed for cough. 20 capsule 0  . bisacodyl (DULCOLAX) 5 MG EC tablet Take 5 mg by mouth 2 (two) times daily.     . calcitRIOL (ROCALTROL) 0.25 MCG capsule Take 0.25 mcg by mouth  daily.   4  . cyclophosphamide (CYTOXAN) 25 MG capsule Take 25 mg by mouth daily. Take one capsule by mouth daily on an empty stomach 1 hour before or 2 hours after meal.  0  . dextromethorphan (DELSYM) 30 MG/5ML liquid Take 30 mg by mouth at bedtime as needed for cough.    . finasteride (PROSCAR) 5 MG tablet Take 1 tablet (5 mg total) by mouth daily. 90 tablet 3  . guaiFENesin-codeine 100-10 MG/5ML syrup Take 10 mLs by mouth every 6 (six) hours as needed for cough.  120 mL 0  . HYDROcodone-homatropine (HYDROMET) 5-1.5 MG/5ML syrup Take 5 mLs by mouth every 4 (four) hours as needed. (Patient taking differently: Take 5 mLs by mouth every 4 (four) hours as needed for cough. ) 120 mL 0  . ipratropium-albuterol (DUONEB) 0.5-2.5 (3) MG/3ML SOLN Take 3 mLs by nebulization every 4 (four) hours as needed (if inhaler is not effective in resolving shortness of breath or wheezing). 360 mL 11  . levofloxacin (LEVAQUIN) 750 MG tablet TAKE ONE TABLET BY MOUTH ONCE DAILY AS  NEEDED  FOR  LUNGS 7 tablet 11  . loratadine (CLARITIN) 10 MG tablet Take 10 mg by mouth daily.     . Methylcellulose, Laxative, (CITRUCEL PO) Take 1 packet by mouth daily.    . metoprolol succinate (TOPROL-XL) 25 MG 24 hr tablet TAKE ONE TABLET BY MOUTH ONCE DAILY 90 tablet 1  . nitroGLYCERIN (NITROSTAT) 0.4 MG SL tablet Place 1 tablet (0.4 mg total) under the tongue every 5 (five) minutes x 3 doses as needed for chest pain. 25 tablet 12  . ondansetron (ZOFRAN) 4 MG tablet Take 1 tablet (4 mg total) by mouth every 8 (eight) hours as needed for nausea or vomiting. 30 tablet 5  . oxyCODONE-acetaminophen (PERCOCET) 10-325 MG tablet Take 1 tablet by mouth every 6 (six) hours as needed for pain. 60 tablet 0  . predniSONE (DELTASONE) 20 MG tablet Take 20 mg by mouth daily with breakfast.    . ranitidine (ZANTAC) 300 MG tablet Take 1 tablet (300 mg total) by mouth 2 (two) times daily. 2 tablet 0  . simethicone (MYLICON) 778 MG chewable tablet Chew 250 mg by mouth 2 (two) times daily as needed for flatulence. TAKE PER BOX FOR GAS     . simvastatin (ZOCOR) 20 MG tablet TAKE ONE TABLET BY MOUTH ONCE DAILY AT  6  PM 90 tablet 3  . sodium bicarbonate 650 MG tablet Take 2 tablets (1,300 mg total) by mouth 2 (two) times daily. NEED OV. 120 tablet 1  . sodium chloride (OCEAN) 0.65 % SOLN nasal spray Place 1 spray into both nostrils as needed for congestion.    . SYMBICORT 160-4.5 MCG/ACT inhaler INHALE TWO PUFFS BY MOUTH  TWICE DAILY 3 Inhaler 3  . tamsulosin (FLOMAX) 0.4 MG CAPS capsule TAKE ONE CAPSULE BY MOUTH ONCE DAILY IN THE EVENING 30 capsule 2     Allergies:   Patient has no known allergies.   Social History   Social History  . Marital status: Married    Spouse name: N/A  . Number of children: N/A  . Years of education: N/A   Occupational History  . Retired    Social History Main Topics  . Smoking status: Never Smoker  . Smokeless tobacco: Never Used  . Alcohol use No  . Drug use: No  . Sexual activity: Not on file   Other Topics Concern  . Not on file  Social History Narrative  . No narrative on file     Family History:  The patient's ***family history includes Asthma in his father; Coronary artery disease in his brother, brother, and mother.   Review of Systems:   Please see the history of present illness.     General:  No chills, fever, night sweats or weight changes.  Cardiovascular:  No chest pain, dyspnea on exertion, edema, orthopnea, palpitations, paroxysmal nocturnal dyspnea. Dermatological: No rash, lesions/masses Respiratory: No cough, dyspnea Urologic: No hematuria, dysuria Abdominal:   No nausea, vomiting, diarrhea, bright red blood per rectum, melena, or hematemesis Neurologic:  No visual changes, wkns, changes in mental status. All other systems reviewed and are otherwise negative except as noted above.   Physical Exam:    VS:  There were no vitals taken for this visit.   General: Well developed, well nourished,male appearing in no acute distress. Head: Normocephalic, atraumatic, sclera non-icteric, no xanthomas, nares are without discharge.  Neck: No carotid bruits. JVD not elevated.  Lungs: Respirations regular and unlabored, without wheezes or rales.  Heart: ***Regular rate and rhythm. No S3 or S4.  No murmur, no rubs, or gallops appreciated. Abdomen: Soft, non-tender, non-distended with normoactive bowel sounds. No hepatomegaly. No rebound/guarding. No  obvious abdominal masses. Msk:  Strength and tone appear normal for age. No joint deformities or effusions. Extremities: No clubbing or cyanosis. No edema.  Distal pedal pulses are 2+ bilaterally. Neuro: Alert and oriented X 3. Moves all extremities spontaneously. No focal deficits noted. Psych:  Responds to questions appropriately with a normal affect. Skin: No rashes or lesions noted  Wt Readings from Last 3 Encounters:  02/07/2017 89 lb 14.4 oz (40.8 kg)  02/20/17 95 lb 6.4 oz (43.3 kg)  02/06/17 103 lb (46.7 kg)        Studies/Labs Reviewed:   EKG:  EKG is*** ordered today.  The ekg ordered today demonstrates ***  Recent Labs: 07/30/2016: B Natriuretic Peptide 287.4 02/23/2017: ALT 12 02/22/2017: TSH 0.758 03/06/2017: Magnesium 1.8 04-01-17: BUN 29; Creatinine, Ser 1.28; Hemoglobin 9.8; Platelets 64; Potassium 3.4; Sodium 144   Lipid Panel    Component Value Date/Time   CHOL 103 (L) 03/24/2015 0919   TRIG 97 03/24/2015 0919   HDL 45 03/24/2015 0919   CHOLHDL 2.3 03/24/2015 0919   VLDL 19 03/24/2015 0919   LDLCALC 39 03/24/2015 0919    Additional studies/ records that were reviewed today include:  ***  Assessment:    No diagnosis found.   Plan:   In order of problems listed above:  1. ***    Medication Adjustments/Labs and Tests Ordered: Current medicines are reviewed at length with the patient today.  Concerns regarding medicines are outlined above.  Medication changes, Labs and Tests ordered today are listed in the Patient Instructions below. There are no Patient Instructions on file for this visit.   Signed, Erma Heritage, PA-C  April 01, 2017 8:53 AM    [place] [place], [place]  [address] Phone: [phone]; Fax: [phone]  [address] [place], [place] [address] Phone: [phone]

## 2017-03-08 NOTE — Progress Notes (Signed)
Nutrition Follow-up  DOCUMENTATION CODES:   Severe malnutrition in context of acute illness/injury  INTERVENTION:   -Continue Boost Breeze po TID, each supplement provides 250 kcal and 9 grams of protein  NUTRITION DIAGNOSIS:   Malnutrition (Severe) related to acute illness (FTT) as evidenced by percent weight loss, severe depletion of muscle mass, severe depletion of body fat.  Ongoing  GOAL:   Patient will meet greater than or equal to 90% of their needs  Unmet  MONITOR:   PO intake, Supplement acceptance, Weight trends  REASON FOR ASSESSMENT:   Consult Poor PO  ASSESSMENT:   Pt with PMH of PUD/esophagtitis, HTN, hital hernia, diverticulosis, CKD, CAD with CABG, COPD, and Wegener's granulomatosis. Presents this admission with acute on chronic hypoxic respiratory failure and FTT.   Pt transferred from floor to SDU due to respiratory status.   Pt currently on Bi-pap. Case discussed with MD and RN, who reports diet is currently held due to respiratory failure. Prior to transfer to SDU, intake remained very poor (PO: 0%).   Palliative care team continues to follow; family still struggling with goals of care. They still desire pt to remains full code, despite minimal progress.   Medications reviewed and include metamucil and solu-medrol.   Labs reviewed: K: 3.4 (on PO supplementation).   Diet Order:  Diet regular Room service appropriate? Yes; Fluid consistency: Thin  Skin:   (stage I sacrum)  Last BM:  03/03/17  Height:   Ht Readings from Last 1 Encounters:  03/06/17 5\' 7"  (1.702 m)    Weight:   Wt Readings from Last 1 Encounters:  02/11/2017 89 lb 14.4 oz (40.8 kg)    Ideal Body Weight:  61.4 kg  BMI:  Body mass index is 14.08 kg/m.  Estimated Nutritional Needs:   Kcal:  1500-1700 (36-41 kcal/kg)  Protein:  85-95 grams (1.4-1.5 g/kg IBW)  Fluid:  >1.5 L/day  EDUCATION NEEDS:   No education needs identified at this time  Yoandri Congrove A. Jimmye Norman,  RD, LDN, CDE Pager: [phone] After hours Pager: [phone]

## 2017-03-08 NOTE — Progress Notes (Signed)
Post -mortem done, body brought down to the morgue.

## 2017-03-08 NOTE — Telephone Encounter (Signed)
She wanted to make sure that Dr Debara Pickett knew that the pt is in the hospital.

## 2017-03-08 DEATH — deceased

## 2017-03-11 IMAGING — CR DG CHEST 2V
2 series · 2 of 2 positions shown · non-contrast
Comparison: 06/09/2016

CLINICAL DATA: Shortness of breath with exertion

EXAM:
CHEST  2 VIEW

[w chest pa]
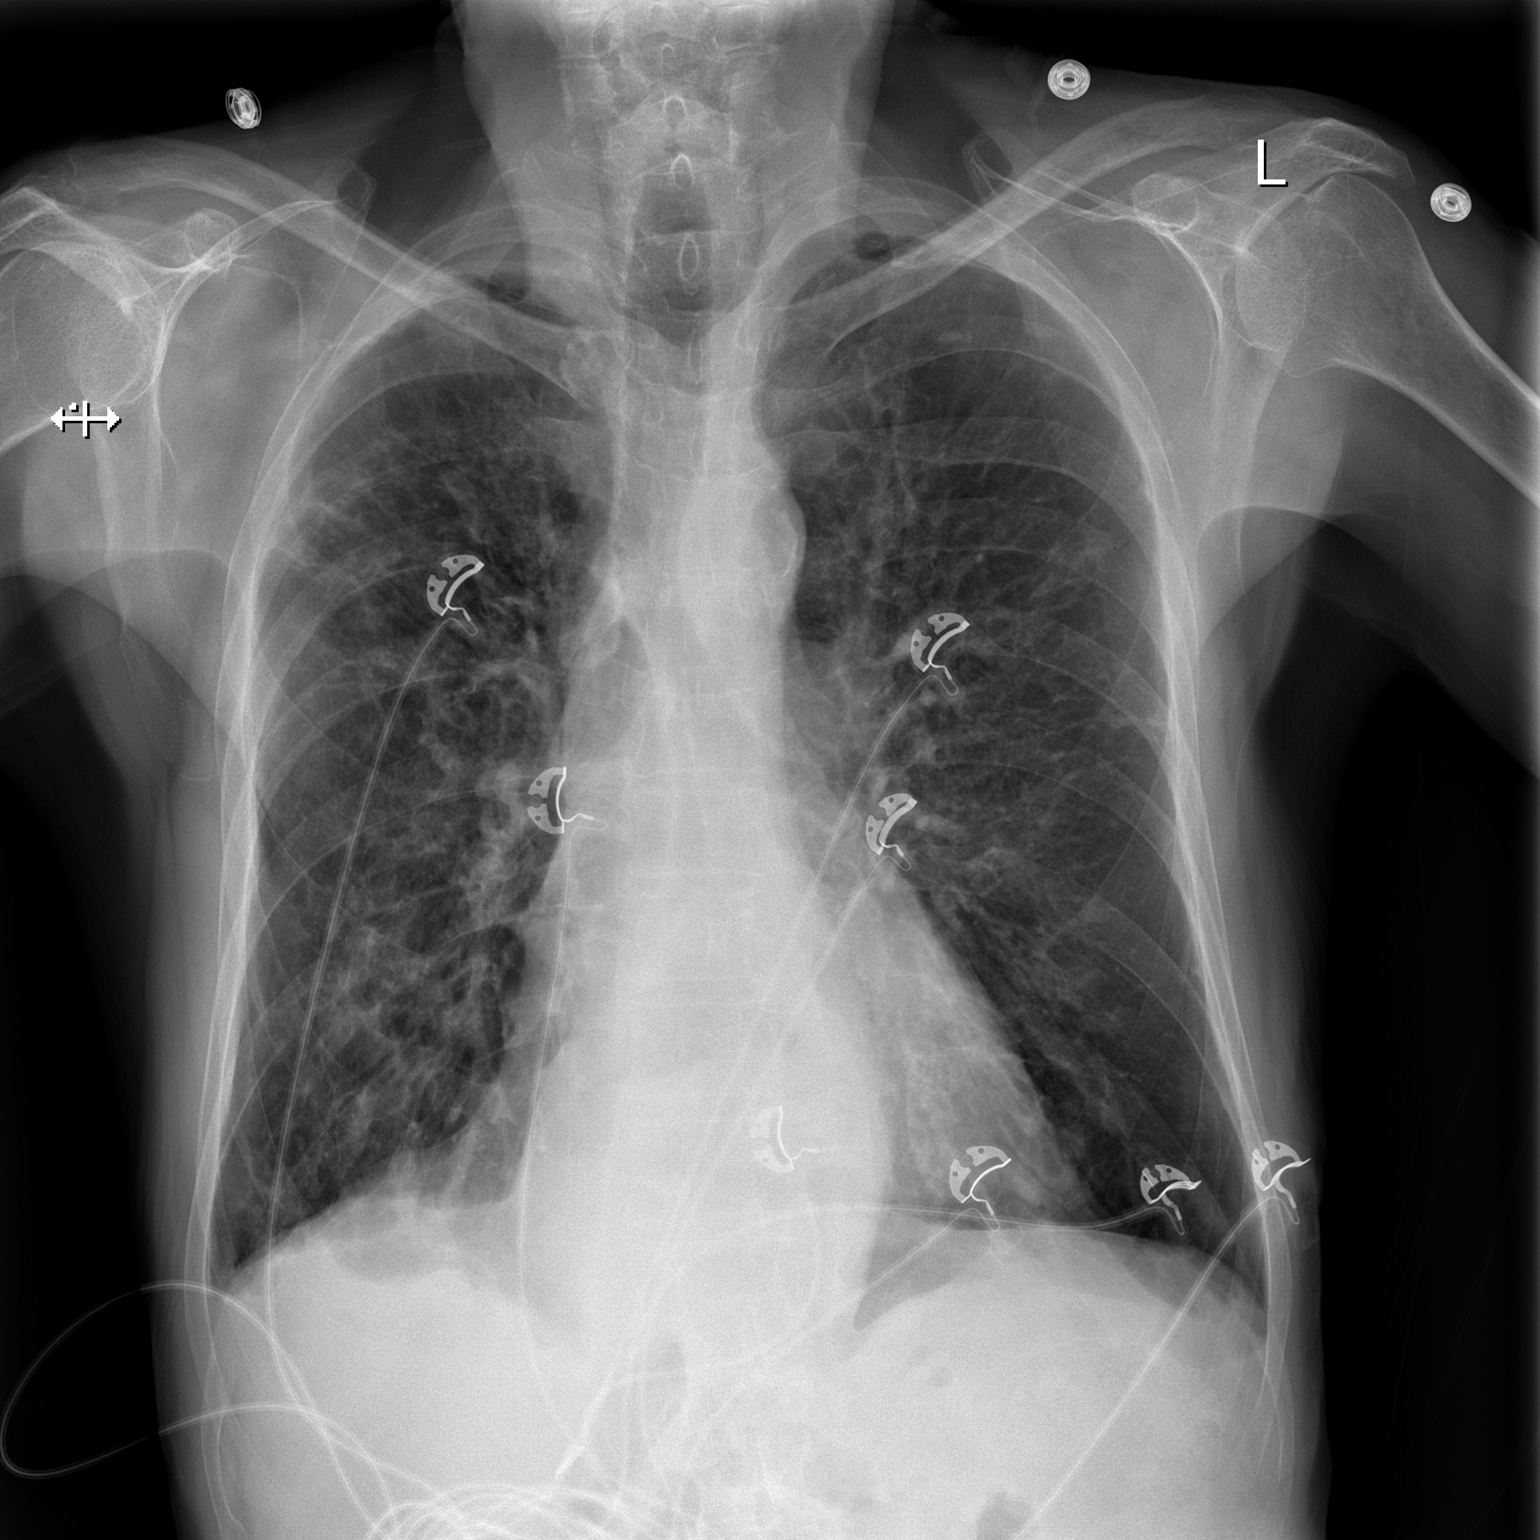

[w chest lat]
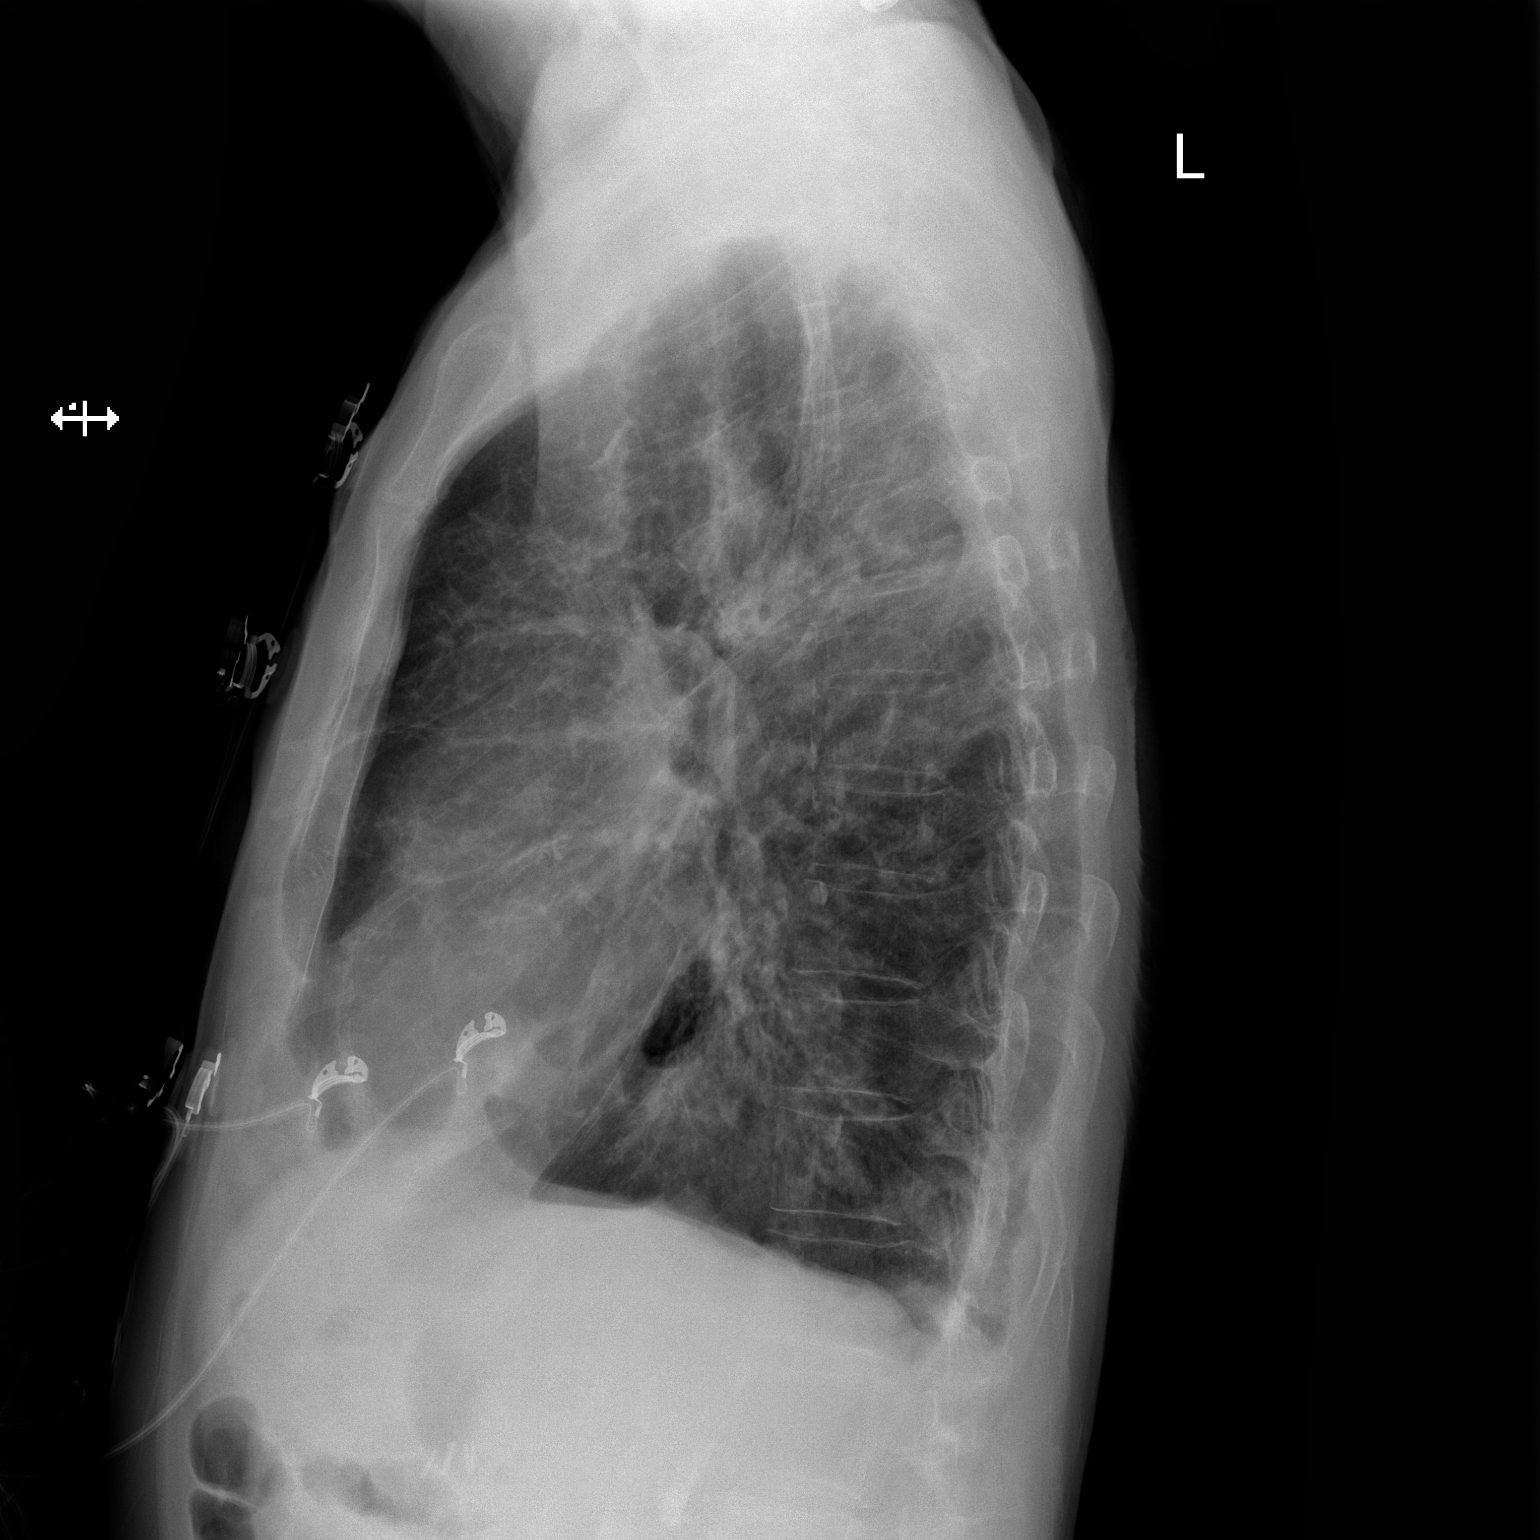

[2 of 2 positions shown; findings below may reference images not displayed]

FINDINGS: Cardiac shadow is stable. Hiatal hernia is again seen and stable.
Patchy fibrotic changes are noted bilaterally stable from the prior
exam. Bronchial dilatation consistent with bronchiectasis seen
previously is noted. No acute bony abnormality is seen.
IMPRESSION: Chronic changes without acute abnormality.

## 2017-03-11 IMAGING — CT CT CHEST W/O CM
2 of 4 series · 15 of 36 positions shown, 18 images · non-contrast
Comparison: 01/07/2016

CLINICAL DATA: Weakness and shortness of Breath

EXAM:
CT CHEST WITHOUT CONTRAST
TECHNIQUE: Multidetector CT imaging of the chest was performed following the
standard protocol without IV contrast.

[Series 2: chest w/o st · axial · non-contrast · 0.77mm/px · z∈[+1482,+1754]mm · 12 of 162 slices shown, 15 images]
[im 13/162  mediastinal]
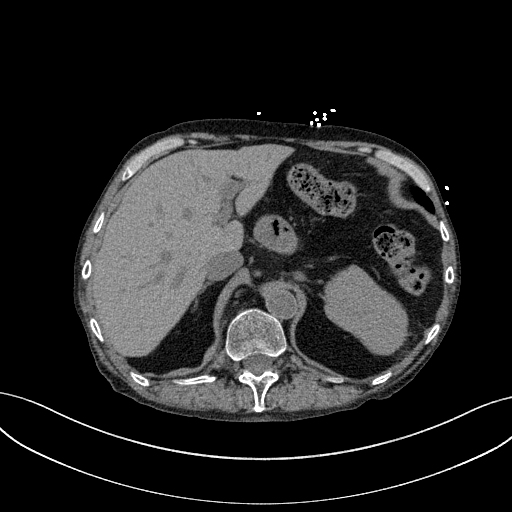
[im 13/162  lung]
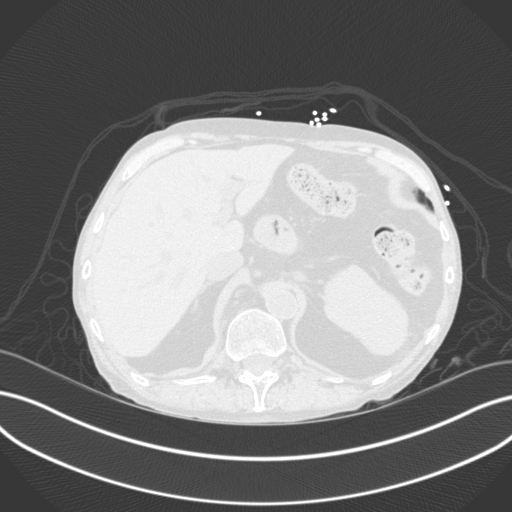
[im 25/162  lung]
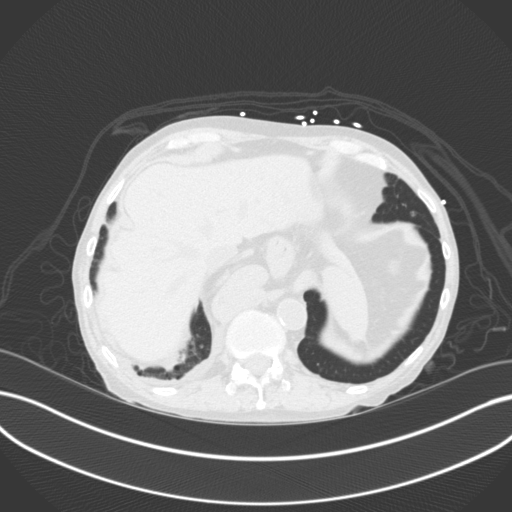
[im 38/162  lung]
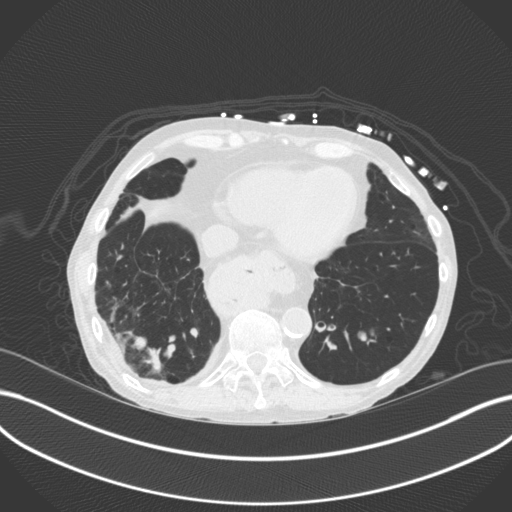
[im 50/162  lung]
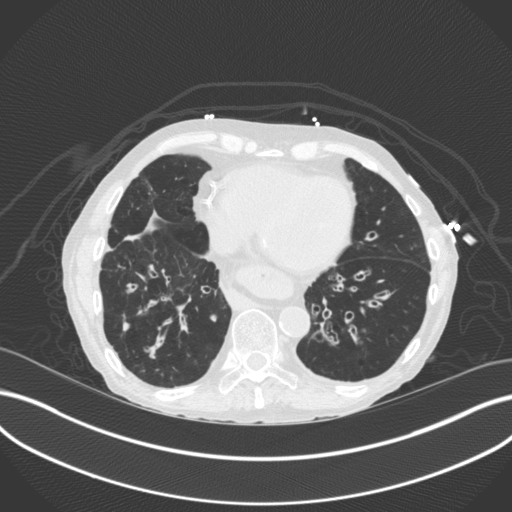
[im 62/162  mediastinal]
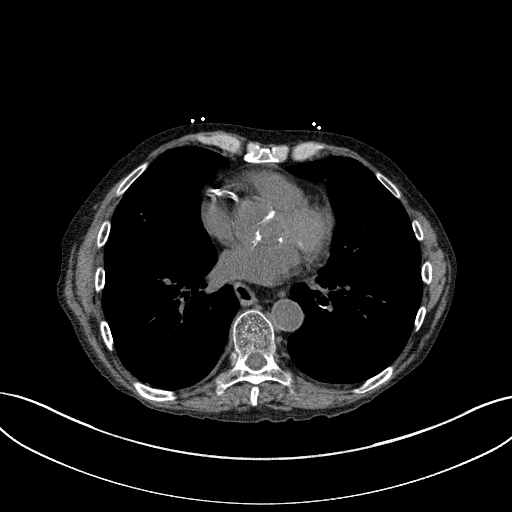
[im 62/162  lung]
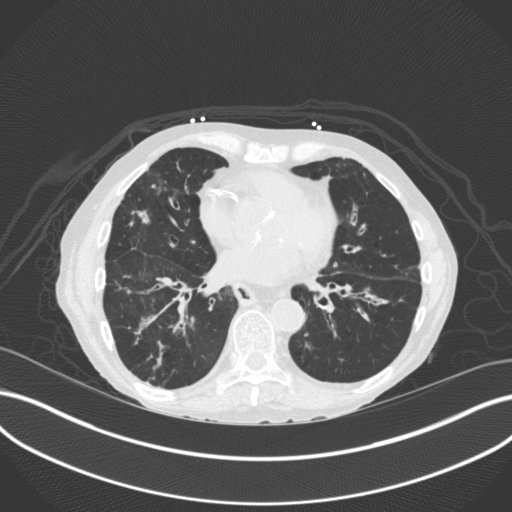
[im 75/162  lung]
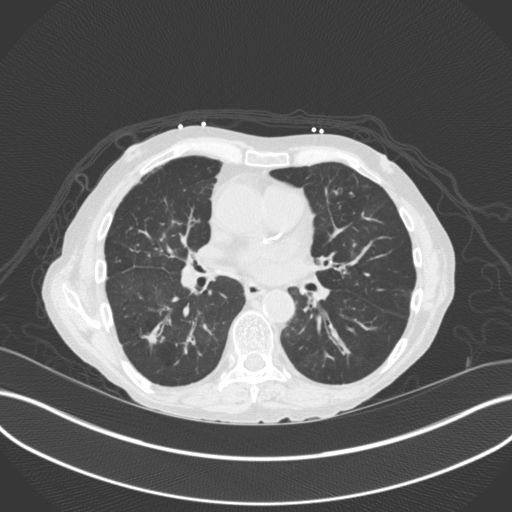
[im 87/162  lung]
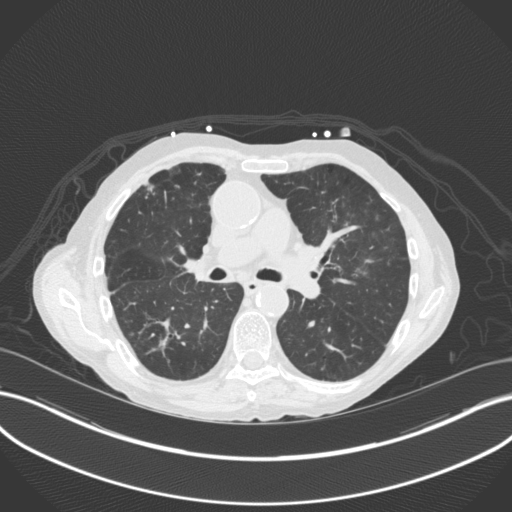
[im 100/162  lung]
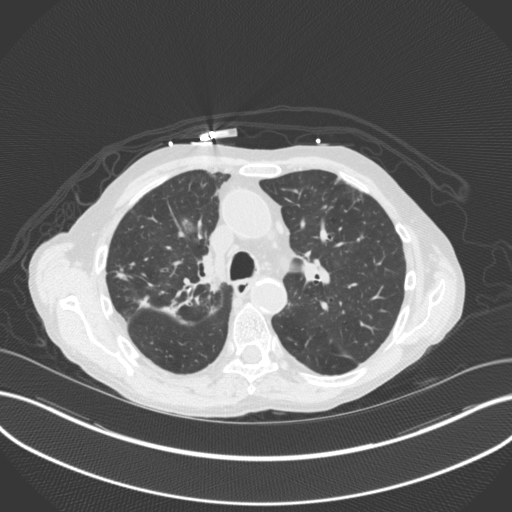
[im 112/162  mediastinal]
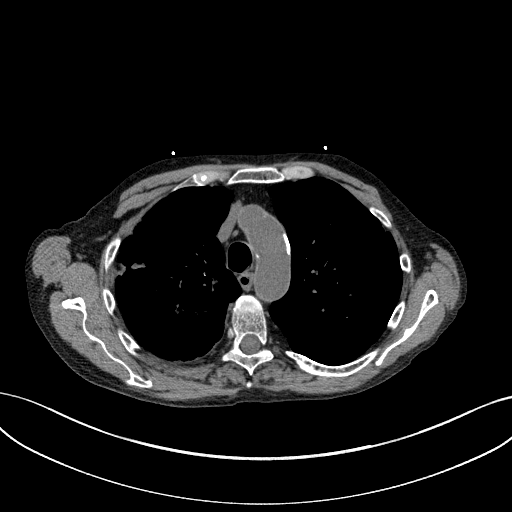
[im 112/162  lung]
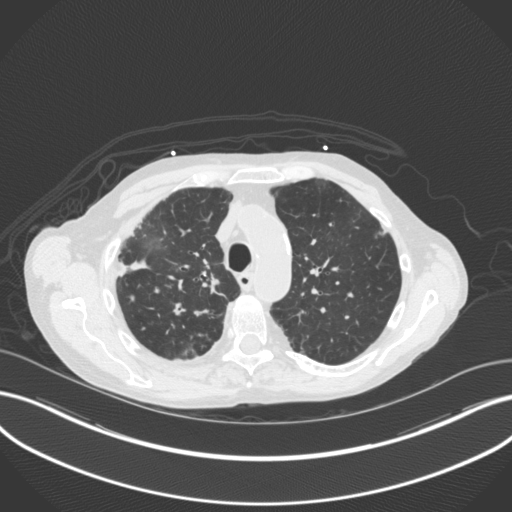
[im 124/162  lung]
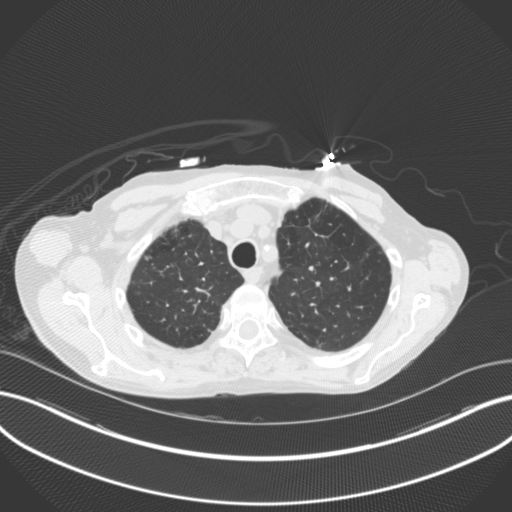
[im 137/162  lung]
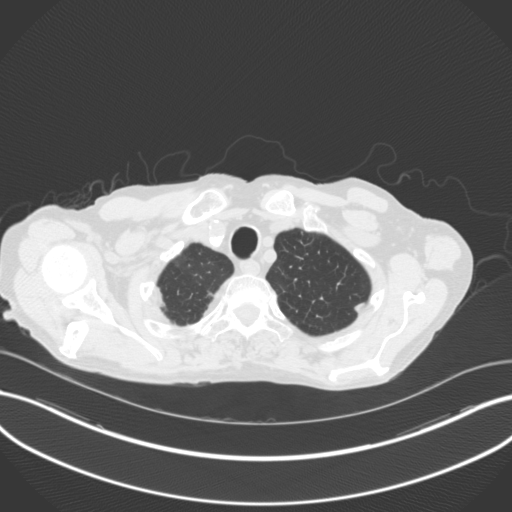
[im 149/162  lung]
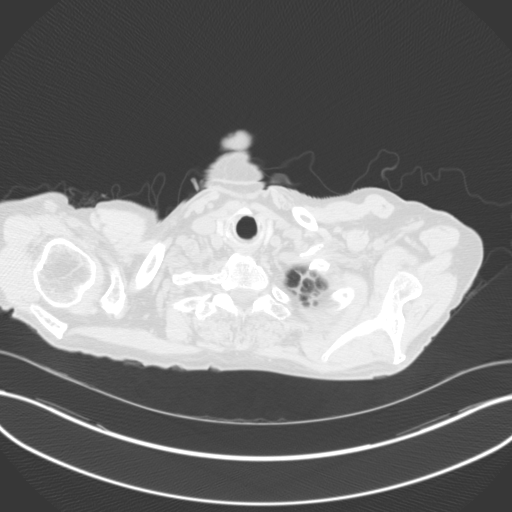

[Series 3: coronals · coronal · 0.63mm/px · 3 of 122 slices shown]
[im 25/122  lung]
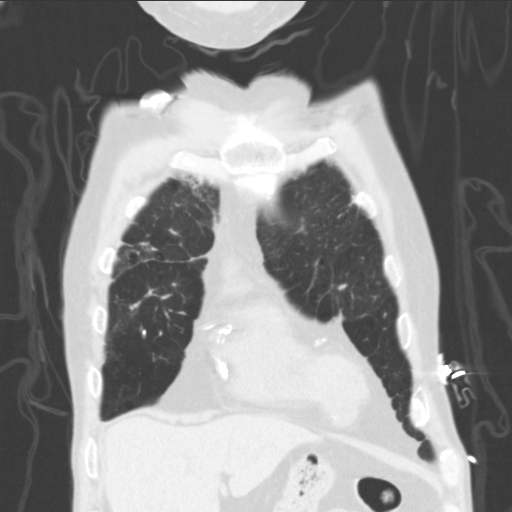
[im 49/122  lung]
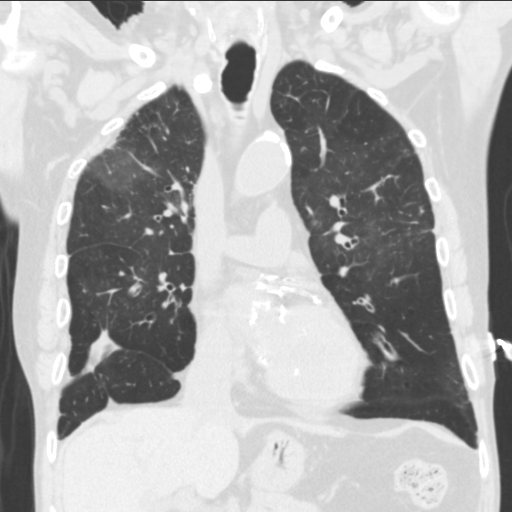
[im 73/122  lung]
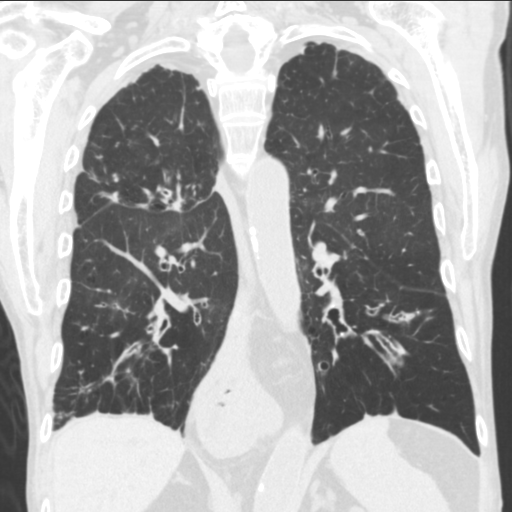

[15 of 36 positions shown; findings below may reference images not displayed]

FINDINGS: Cardiovascular: Somewhat limited by the lack of IV contrast. Diffuse
aortic calcifications are seen without aneurysmal dilatation. Heavy
coronary calcifications are seen. The cardiac structures are
otherwise within normal limits.

Mediastinum/Nodes: Large hiatal hernia is again identified. No
significant hilar or mediastinal adenopathy is noted. The thoracic
inlet is within normal limits.

Lungs/Pleura: Changes of bronchiectasis are again seen in the lower
lobes bilaterally. The degree of mucous plugging has improved
significantly in the interval from the prior exam. Chronic fibrotic
changes are seen bilaterally. Some stable areas of nodularity are
noted particularly in the right lower lobe but to a lesser degree in
the right upper lobe. These are stable in appearance from the prior
exam. Continued follow-up.

Upper Abdomen:  No acute abnormality noted.

Musculoskeletal: No chest wall mass or suspicious bone lesions
identified.
IMPRESSION: Changes of bronchiectasis predominately in the lower lobes
bilaterally. The degree of mucous plugging has improved from the
prior exam.

Persistent postinflammatory changes in the right lower lobe and to a
lesser degree in right upper lobe stable from the previous exam. No
new focal abnormality is seen.

Large stable hiatal hernia.

## 2017-03-14 NOTE — Telephone Encounter (Signed)
Sympathy card mailed 

## 2017-03-29 ENCOUNTER — Institutional Professional Consult (permissible substitution): Payer: Medicare Other | Admitting: Internal Medicine

## 2017-04-02 IMAGING — CR DG CHEST 2V
2 series · 2 of 2 positions shown · non-contrast
Comparison: Chest radiographs and CT dated 07/30/2016.

CLINICAL DATA: Productive cough. Shortness of breath. Weakness.
Fever for the past 2 days.

EXAM:
CHEST  2 VIEW

[w chest pa]
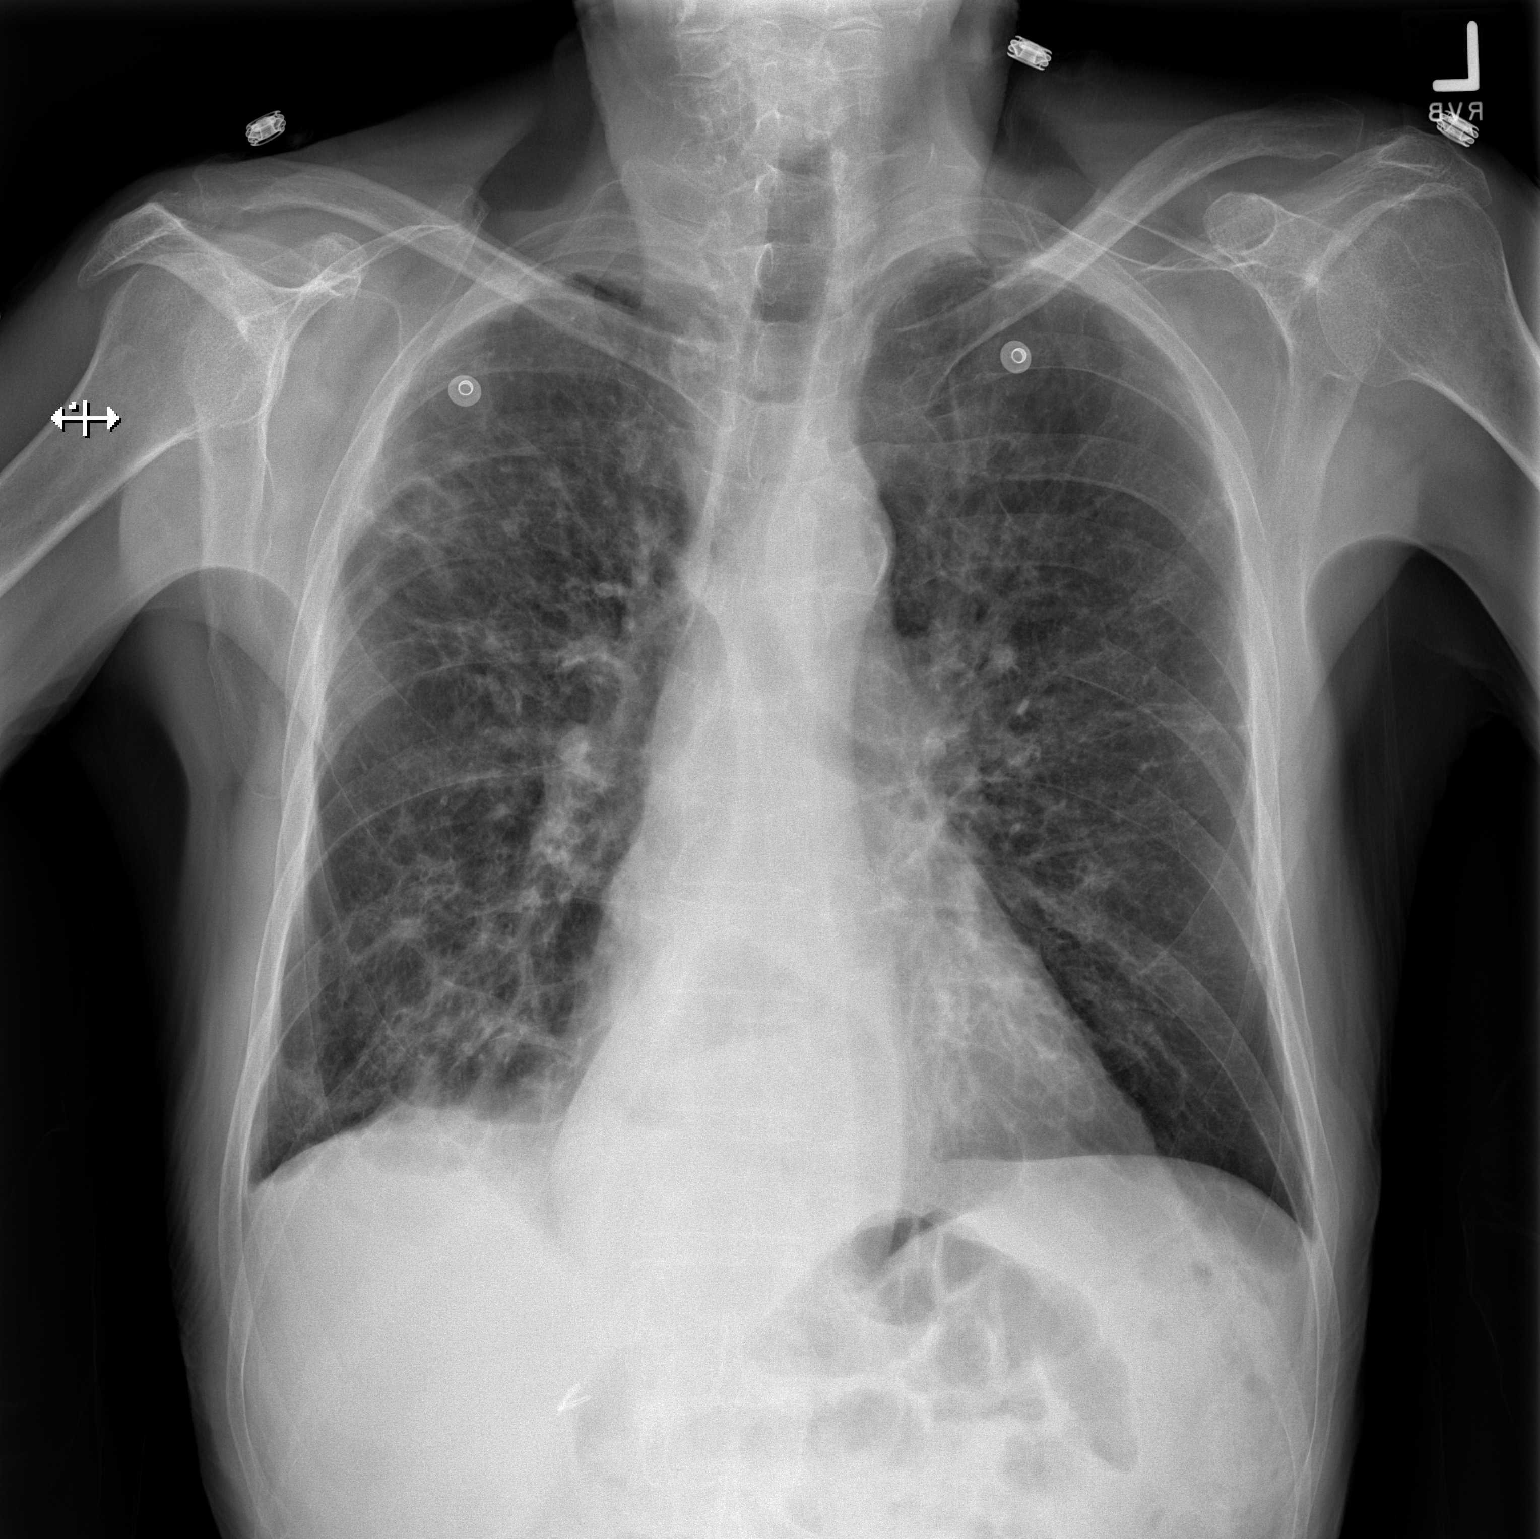

[w chest lat]
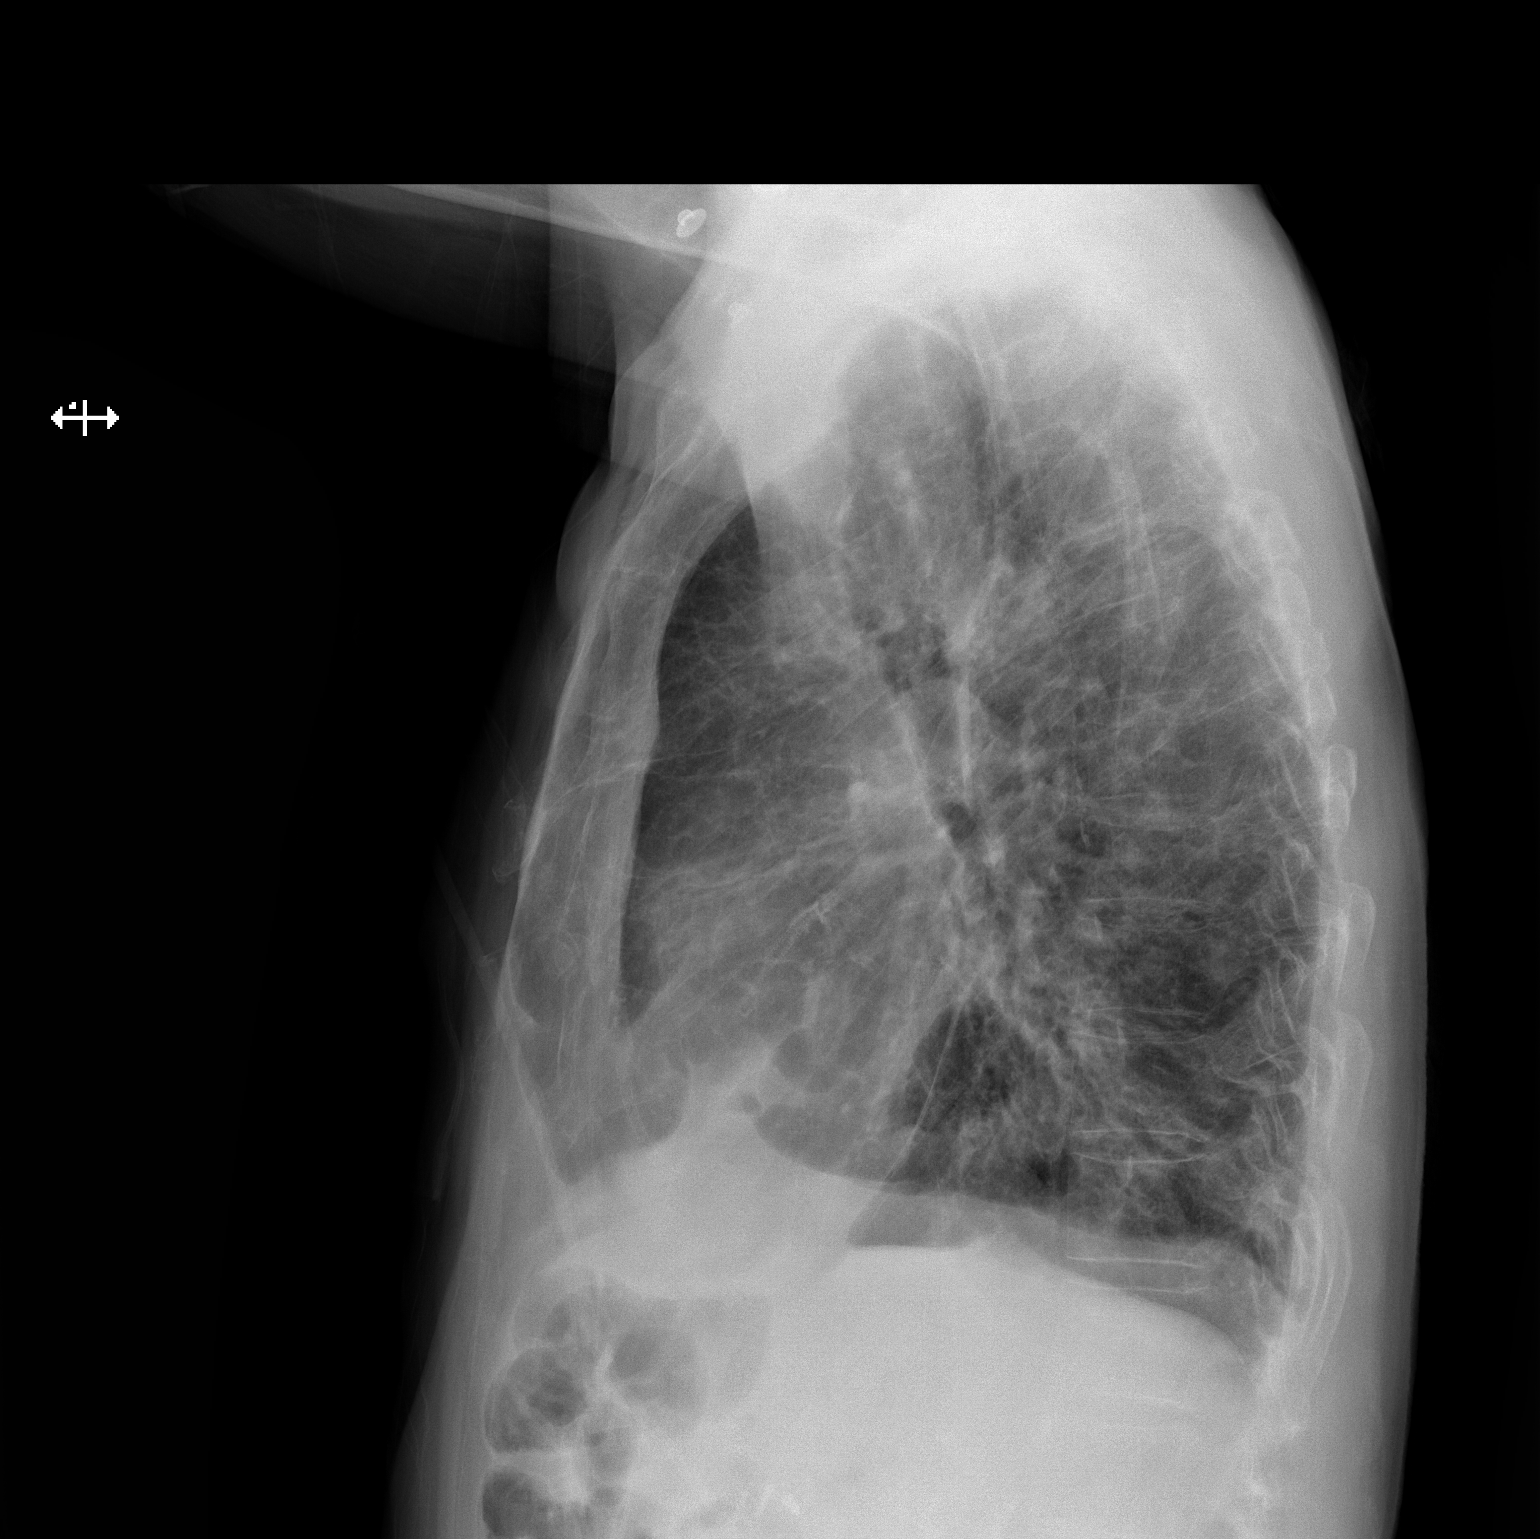

[2 of 2 positions shown; findings below may reference images not displayed]

FINDINGS: Normal sized heart. Aortic arch calcifications. Moderate-sized
hiatal hernia. Stable coarse interstitial prominence throughout both
lungs. Cholecystectomy clips. Diffuse osteopenia.
IMPRESSION: 1. No acute abnormality.
2. Stable interstitial fibrosis.
3. Stable moderate-sized hiatal hernia.
4. Aortic atherosclerosis.

## 2017-04-08 NOTE — Death Summary Note (Signed)
DEATH SUMMARY   Patient Details  Name: Richard [name] MRN: [id number] DOB: [birth year]  Admission/Discharge Information   Admit Date:  2017/03/04  Date of Death: Date of Death: 2017-03-18  Time of Death: Time of Death: 1613/11/15  Length of Stay: 10-31-2022  Referring Physician: [name], MD   Reason(s) for Hospitalization     Diagnoses  Preliminary cause of death: Wegner's Granulomatosis-   Obstructive bronchiectasis ([place]) Secondary Diagnoses (including complications and co-morbidities):  Active Problems:   Chronic kidney disease, stage IV (severe) (HCC)   Protein-calorie malnutrition, severe (HCC)   Hypotension   Acute respiratory failure with hypoxia (HCC)   Chest pressure   FTT (failure to thrive) in adult   Pressure injury of skin   PAF (paroxysmal atrial fibrillation) ([place])   Goals of care, counseling/discussion   Palliative care by specialist   Terminal care   Brief Hospital Course (including significant findings, care, treatment, and services provided and events leading to death)  Richard [name] is a 81 y.o. year old male  81 year old male with Wegener's granulomatosis, PUD, esophagitis, hypertension, COPD, CAD and stent placement, mod Ao stenosis, HTN, severe failure to thrive with weight loss and poor oral intake who presented to ED with shortness of breath, cough and also difficulty swallowing. Pt had pulmonary, GI and ENT evaluation. He is not a candidate for aggressive therapy based on his poor resp status and pulmonary recommended to start a palliative care discusison. In the interim he developed A-fib with RVR, acute thrombocytopenia and was still eating poorly. The patient has declined to get out of bed with PT for 5 days in a row and PT has signed off.  Noted to be severely short of breath and hypotensive on 7/30 and also in A-fib with RVR. Family and patient wanted aggressive care despite being told that his prognosis was poor. He was transferred to SDU and placed on  BiPAP for his distress. A-fib converted to sinus rhythm after fluid boluses given.   He was continued on IV steroids and IV antibiotics. He was seen the following morning and was more unresponsive and in continued respiratory distress. He expired soon after BiPAP was removed after another discussion with palliative care.      Pertinent Labs and Studies  Significant Diagnostic Studies Dg Chest 2 View  Result Date: Mar 04, 2017 CLINICAL DATA:  Chest pain and shortness of breath for the past 3 weeks. EXAM: CHEST  2 VIEW COMPARISON:  02/20/2017. FINDINGS: Normal sized heart. The lungs remain hyperexpanded. Patchy opacity is again demonstrated throughout the majority of the right lung. The left lung is clear. The lungs remain hyperexpanded. Stable prominence of the interstitial markings in both lungs. Diffuse osteopenia. Stable marked T7 vertebral compression deformity. IMPRESSION: Stable changes of COPD with interstitial fibrosis. No acute abnormality. Electronically Signed   By: Claudie Revering M.D.   On: 04-Mar-2017 11:26   Dg Chest 2 View  Result Date: 02/20/2017 CLINICAL DATA:  81 y/o male with cough. Wegener granulomatosis with renal involvement. EXAM: CHEST  2 VIEW COMPARISON:  02/17/2017 and earlier, including thoracic spine MRI 12/30/2016 and chest CT 07/30/2016. FINDINGS: Chronic bronchiectasis with peribronchial thickening and large lung volumes. No superimposed pneumothorax or pleural effusion. No consolidation or acute pulmonary opacity identified. Stable cardiac size and mediastinal contours. Calcified aortic atherosclerosis. Chronic hiatal hernia. Visualized tracheal air column is within normal limits. Stable cholecystectomy clips. Negative visible bowel gas pattern. T7 compression fracture again noted. Osteopenia. No acute osseous abnormality  identified. IMPRESSION: 1. Chronic lung disease with bronchiectasis and peribronchial opacity appears stable since 01/27/2017. 2.  No superimposed acute  findings are identified. Electronically Signed   By: Genevie Ann M.D.   On: 02/20/2017 13:23   Dg Chest 2 View  Result Date: 02/17/2017 CLINICAL DATA:  Shortness of breath.  Cough. EXAM: CHEST  2 VIEW COMPARISON:  Radiographs of January 27, 2017. FINDINGS: The heart size and mediastinal contours are within normal limits. Atherosclerosis of thoracic aorta is noted. Stable interstitial densities are noted throughout both lungs most consistent with scarring or fibrosis ; this is most prominently seen in the right lung base. No pneumothorax or pleural effusion is noted. Stable old compression fracture of midthoracic vertebral body. IMPRESSION: Aortic atherosclerosis. Stable bilateral lung opacities are noted concerning for scarring or fibrosis. No new opacities are noted. Electronically Signed   By: Marijo Conception, M.D.   On: 02/17/2017 16:00   Dg Esophagus  Result Date: 02/27/2017 CLINICAL DATA:  Dysphagia EXAM: ESOPHOGRAM/BARIUM SWALLOW TECHNIQUE: Single contrast examination was performed using  and. FLUOROSCOPY TIME:  Fluoroscopy Time:  1 minutes 6 seconds Radiation Exposure Index (if provided by the fluoroscopic device): Number of Acquired Spot Images: 0 COMPARISON:  Modified barium swallow performed yesterday FINDINGS: Study is limited by patient's condition and inability to swallow large volumes, only small sips for noted. There is mild narrowing in the proximal cervical esophagus. When compared to prior modified barium swallow, this may be related to prominent cricopharyngeus impression. No diverticulum noted. Extensive tertiary contractions throughout the remainder of the esophagus. Large hiatal hernia noted. IMPRESSION: Mild narrowing in the proximal cervical esophagus may be related to prominent cricopharyngeus impression. No diverticulum. Large hiatal hernia. Dysmotility, esophageal spasm. Electronically Signed   By: Rolm Baptise M.D.   On: 02/27/2017 08:36   Dg Chest Port 1 View  Result Date:  03/06/2017 CLINICAL DATA:  Respiratory distress EXAM: PORTABLE CHEST 1 VIEW COMPARISON:  Chest radiograph 02/26/2017 FINDINGS: Multifocal, right worse than left, opacities have worsened since the prior examination. Small right pleural effusion is unchanged. There is calcific aortic atherosclerosis. Cardiomediastinal silhouette is unchanged. IMPRESSION: Slight worsening of likely multifocal pneumonia, particularly in the right lung base. Electronically Signed   By: Ulyses Jarred M.D.   On: 03/06/2017 18:59   Dg Chest Port 1 View  Result Date: 02/26/2017 CLINICAL DATA:  Respiratory failure EXAM: PORTABLE CHEST 1 VIEW COMPARISON:  02/18/2017 FINDINGS: Multifocal patchy opacities in the bilateral upper lobes and to a lesser extent the right lower lobe. This has progressed since the prior, particularly in the left upper lobe, favoring multifocal pneumonia. Suspected small right pleural effusion, new.  No pneumothorax. Mild cardiomegaly. IMPRESSION: Multifocal patchy opacities, upper lobe predominant, progressed from the prior and favoring multifocal pneumonia. Small right pleural effusion, new. Electronically Signed   By: Julian Hy M.D.   On: 02/26/2017 13:43   Dg Swallowing Func-speech Pathology  Result Date: 02/26/2017 Objective Swallowing Evaluation: Type of Study: MBS-Modified Barium Swallow Study Patient Details Name: Richard [name] MRN: [id number] Date of Birth: 13-Jul-1936 Today's Date: 02/26/2017 Time: SLP Start Time (ACUTE ONLY): 1020-SLP Stop Time (ACUTE ONLY): 1045 SLP Time Calculation (min) (ACUTE ONLY): 25 min Past Medical History: Past Medical History: Diagnosis Date . Adenomatous colon polyp  . Anemia  . Asthma  . BPH (benign prostatic hyperplasia)   sees Dr. Risa Grill, biopsy June 2015 was benign  . Bronchiectasis ([place])  . CAD (coronary artery disease)   a. [place] s/p [facility] x3  and ultimately BMS to pLAD (vision BMS 3.0 x 18), POBA to CTO of Cx-OM 03/17/14 and staged Rotablator-PCI w/ BMS (Mini Vision  2.5 x 28) to mRCA on 03/21/14 (unsuitable for CABG). . Chronic bronchitis ([place])  . CKD (chronic kidney disease), stage IV ([place])  . COPD (chronic obstructive pulmonary disease) ([place])  . Diverticulosis  . Femoral artery pseudo-aneurysm, right ([place])   a. s/p repair. Follow up with Dr. Kellie Simmering  . Fungal infection   lungs . GERD (gastroesophageal reflux disease)  . GI bleed   a. 10/2016 - Plavix stopped. . Hiatal hernia  . History of blood transfusion 07/2016; 11/01/2016  "low blood; low blood" . History of kidney stones  . HOH (hard of hearing)   bilaterally . HTN (hypertension) 09/28/2013 . Ischemic cardiomyopathy   a. Prior EF 40-45% by echo in 2015, improved in 2016. . Mild mitral regurgitation  . Moderate aortic stenosis by prior echocardiogram  . Peptic stricture of esophagus  . Pneumonia   "1-2 times" (11/01/2016) . Wegener's granulomatosis ([place])   sees Dr. [name] ; "went from my lungs to my kidneys" (11/01/2016) Past Surgical History: Past Surgical History: Procedure Laterality Date . CARDIAC CATHETERIZATION  03/17/2014  Procedure: CORONARY BALLOON ANGIOPLASTY;  Surgeon: Troy Sine, MD;  Location: [facility] CATH LAB;  Service: Cardiovascular;; . CATARACT EXTRACTION W/ INTRAOCULAR LENS  IMPLANT, BILATERAL  12/18/2012 . CHOLECYSTECTOMY N/A 12/21/2012  Procedure: LAPAROSCOPIC CHOLECYSTECTOMY WITH INTRAOPERATIVE CHOLANGIOGRAM;  Surgeon: Edward Jolly, MD;  Location: [facility] ORS;  Service: General;  Laterality: N/A; . COLONOSCOPY  08-16-11  per Dr. Fuller Plan, diverticulosis and polyps, repeat in 5 yrs  . COLONOSCOPY N/A 08/01/2016  Procedure: COLONOSCOPY;  Surgeon: Carol Ada, MD;  Location: [facility] ENDOSCOPY;  Service: Endoscopy;  Laterality: N/A; . ESOPHAGOGASTRODUODENOSCOPY N/A 07/31/2016  Procedure: ESOPHAGOGASTRODUODENOSCOPY (EGD);  Surgeon: Carol Ada, MD;  Location: [facility];  Service: Endoscopy;  Laterality: N/A; . ESOPHAGOGASTRODUODENOSCOPY (EGD) WITH ESOPHAGEAL DILATION  11-29-10  per Dr. Fuller Plan  . HEMATOMA EVACUATION Right  03/19/2014  Procedure: Suture repair of femoral artery with evacuation of hematoma;  Surgeon: Mal Misty, MD;  Location: [place];  Service: Vascular;  Laterality: Right; . INGUINAL HERNIA REPAIR Right ~ 2002 . LEFT HEART CATHETERIZATION WITH CORONARY ANGIOGRAM N/A 03/14/2014  Procedure: LEFT HEART CATHETERIZATION WITH CORONARY ANGIOGRAM;  Surgeon: Leonie Man, MD;  Location: [facility] CATH LAB: pLAD 90% calcified lesion --> after D1, LAD is Subtotally occluded. mCx 100% (L-L collaterals). pRI 60-80%. mRCA diffuse 80-95%. Marland Kitchen LUNG BIOPSY  03/2014 . PERCUTANEOUS CORONARY STENT INTERVENTION (PCI-S) N/A 03/17/2014  Procedure: PERCUTANEOUS CORONARY STENT INTERVENTION (PCI-S);  Surgeon: Troy Sine, MD;  Location: [facility] CATH LAB: Vision BMS 3.0 mm x 18 mm -- 3.2 mm); PTCA of CTO Cx-OM . PERCUTANEOUS CORONARY STENT INTERVENTION (PCI-S) N/A 03/21/2014  Procedure: PERCUTANEOUS CORONARY STENT INTERVENTION (PCI-S);  Surgeon: Burnell Blanks, MD;  Location: [facility] CATH LAB:  Rotational Atherectomy -- Mini Trek BMS 2.5 x 28 mm --> 2.75 mm) . RENAL BIOPSY   . TRANSTHORACIC ECHOCARDIOGRAM  10/2015; 10/2016  a. Now Moderate AD (AVA ~1.2 cm) EF 60-65%. GR 1 DD.;; b. Stable -- EF 60-65% with moderate LVH. Only noted grade 1 diastolic dysfunction. Moderate aortic stenosis with moderate calcification and thickening. (Estimated valve area between 1.2-1.3 cm) HPI: 81 y.o.malewith medical history significant of Wegener's granulomatosis, PUD and esophagitis, hypertension, hiatal hernia, diverticulosis, COPD, BPH, CAD status post cardiac catheterization and stent placement. Patient presented with multiple generalized and focal complaints all likely related to  cardiopulmonary disease. Per MD notes pt has been complaining of dysphagia with food and liquids. He has had this problem before and wasseen by Dr. Fuller Plan and reports having esophageal dilatation in the past. Orders for MBS per MD. Upper GI 12/242017: benign-appearing esophageal stenosis,  5cm hiatal hernia.   Subjective: Pt anxious, SOB with transfer to [place] chair for MBS Assessment / Plan / Recommendation CHL IP CLINICAL IMPRESSIONS 02/26/2017 Clinical Impression Patient presents with mild oral, moderate pharyngoesophageal and suspected primary esophageal dysphagia. Trace, silent aspiration x1 with mixed consistency (thin liquid/pill) 2/2 bolus retention in small pouch ? Zenker's diverticulum ~C6 with spillover into the airway after the swallow. No other aspiration or penetration observed. Pt with mild oral deficits with pureed and regular solids characterized by delayed oral transit and lingual pumping. Suspect oral deficits are secondary to primary esophageal dysphagia. Swallow initiation was within normal limits at the base of tongue and occasionally valleculae. Pharyngeal stage of swallowing characterized by adequate tongue base retraction, hyolaryngeal excursion and pharyngeal constriction. Noted pooling of bolus ~C6 with sluggish passage through prominent UES, intermittent backflow into the pharynx. Subsequent dry swallows effective for clearing Periodic esophageal sweeps revealed standing column of barium in the middle esophagus, to and fro movements of contrast. No radiologist present to confirm esophageal impairments. Recommend ENT consult re: ? Zenker's, GI f/u given esophageal backflow, reduced bolus flow through UES. Recommend regular diet with thin liquids, medications whole in puree; avoid mixed consistencies, with strict reflux precautions (upright during and for 30 minutes after meals). Pt at moderate risk for aspiration given extensive esophageal hx, esophageal backflow noted during exam. Provided education and teachback to pt/daughter re: results and written/verbal education re: esophageal precautions. SLP Visit Diagnosis Dysphagia, oropharyngeal phase (R13.12);Dysphagia, pharyngoesophageal phase (R13.14) Attention and concentration deficit following -- Frontal lobe and executive  function deficit following -- Impact on safety and function Moderate aspiration risk   CHL IP TREATMENT RECOMMENDATION 02/26/2017 Treatment Recommendations No treatment recommended at this time   No flowsheet data found. CHL IP DIET RECOMMENDATION 02/26/2017 SLP Diet Recommendations Regular solids;Thin liquid;No mixed consistencies Liquid Administration via Cup;Straw Medication Administration Whole meds with puree Compensations Slow rate;Small sips/bites;Multiple dry swallows after each bite/sip;Clear throat intermittently;Follow solids with liquid Postural Changes Remain semi-upright after after feeds/meals (Comment);Seated upright at 90 degrees   CHL IP OTHER RECOMMENDATIONS 02/26/2017 Recommended Consults Consider ENT evaluation;Consider GI evaluation Oral Care Recommendations Oral care BID Other Recommendations --   CHL IP FOLLOW UP RECOMMENDATIONS 02/26/2017 Follow up Recommendations Other (comment)   CHL IP FREQUENCY AND DURATION 02/26/2017 Speech Therapy Frequency (ACUTE ONLY) min 1 x/week Treatment Duration 1 week      CHL IP ORAL PHASE 02/26/2017 Oral Phase Impaired Oral - Pudding Teaspoon -- Oral - Pudding Cup -- Oral - Honey Teaspoon -- Oral - Honey Cup -- Oral - Nectar Teaspoon -- Oral - Nectar Cup -- Oral - Nectar Straw -- Oral - Thin Teaspoon -- Oral - Thin Cup -- Oral - Thin Straw WFL Oral - Puree WFL Oral - Mech Soft -- Oral - Regular Reduced posterior propulsion;Delayed oral transit Oral - Multi-Consistency Premature spillage Oral - Pill -- Oral Phase - Comment --  CHL IP PHARYNGEAL PHASE 02/26/2017 Pharyngeal Phase Impaired Pharyngeal- Pudding Teaspoon -- Pharyngeal -- Pharyngeal- Pudding Cup -- Pharyngeal -- Pharyngeal- Honey Teaspoon -- Pharyngeal -- Pharyngeal- Honey Cup -- Pharyngeal -- Pharyngeal- Nectar Teaspoon -- Pharyngeal -- Pharyngeal- Nectar Cup -- Pharyngeal -- Pharyngeal- Nectar Straw -- Pharyngeal -- Pharyngeal- Thin Teaspoon --  Pharyngeal -- Pharyngeal- Thin Cup -- Pharyngeal --  Pharyngeal- Thin Straw WFL Pharyngeal -- Pharyngeal- Puree WFL Pharyngeal -- Pharyngeal- Mechanical Soft -- Pharyngeal -- Pharyngeal- Regular WFL Pharyngeal -- Pharyngeal- Multi-consistency Penetration/Aspiration before swallow;Trace aspiration;Delayed swallow initiation-pyriform sinuses Pharyngeal Material enters airway, passes BELOW cords without attempt by patient to eject out (silent aspiration) Pharyngeal- Pill -- Pharyngeal -- Pharyngeal Comment --  CHL IP CERVICAL ESOPHAGEAL PHASE 02/26/2017 Cervical Esophageal Phase Impaired Pudding Teaspoon -- Pudding Cup -- Honey Teaspoon -- Honey Cup -- Nectar Teaspoon -- Nectar Cup -- Nectar Straw -- Thin Teaspoon -- Thin Cup -- Thin Straw Reduced cricopharyngeal relaxation;Prominent cricopharyngeal segment;Esophageal backflow into the pharynx;Esophageal backflow into cervical esophagus Puree Reduced cricopharyngeal relaxation;Prominent cricopharyngeal segment;Esophageal backflow into cervical esophagus;Esophageal backflow into the pharynx Mechanical Soft -- Regular Reduced cricopharyngeal relaxation;Prominent cricopharyngeal segment Multi-consistency Reduced cricopharyngeal relaxation;Prominent cricopharyngeal segment Pill -- Cervical Esophageal Comment -- Deneise Lever, MS, CCC-SLP Speech-Language Pathologist [phone] No flowsheet data found. Aliene Altes 02/26/2017, 2:36 PM               Microbiology No results found for this or any previous visit (from the past 240 hour(s)).  Lab Basic Metabolic Panel: No results for input(s): NA, K, CL, CO2, GLUCOSE, BUN, CREATININE, CALCIUM, MG, PHOS in the last 168 hours. Liver Function Tests: No results for input(s): AST, ALT, ALKPHOS, BILITOT, PROT, ALBUMIN in the last 168 hours. No results for input(s): LIPASE, AMYLASE in the last 168 hours. No results for input(s): AMMONIA in the last 168 hours. CBC: No results for input(s): WBC, NEUTROABS, HGB, HCT, MCV, PLT in the last 168 hours. Cardiac Enzymes: No results  for input(s): CKTOTAL, CKMB, CKMBINDEX, TROPONINI in the last 168 hours. Sepsis Labs: No results for input(s): PROCALCITON, WBC, LATICACIDVEN in the last 168 hours.  Procedures/Operations      Londen Lorge 03/16/2017, 4:58 PM

## 2017-04-27 IMAGING — DX DG CHEST 2V
2 series · 2 of 2 positions shown · non-contrast
Comparison: 08/21/2016 and earlier

CLINICAL DATA: Cough and chest congestion for 3 weeks.

EXAM:
CHEST  2 VIEW

[chest pa]
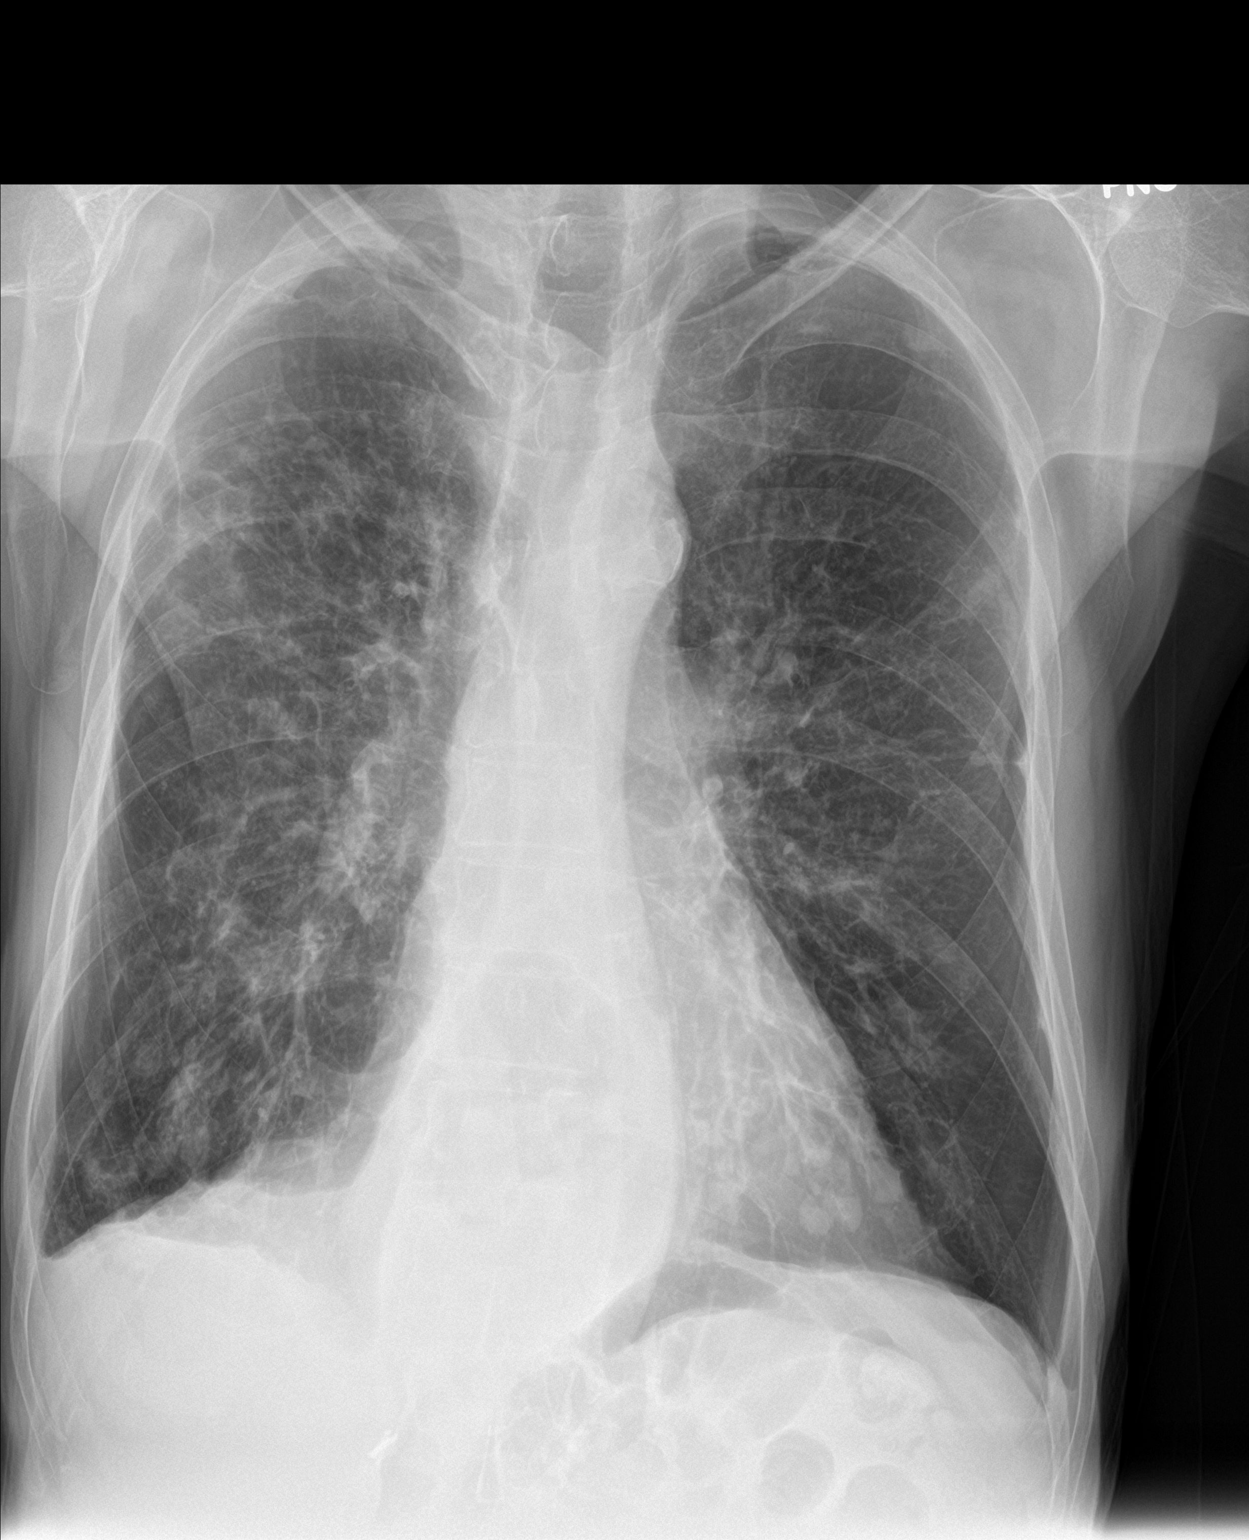

[chest lat]
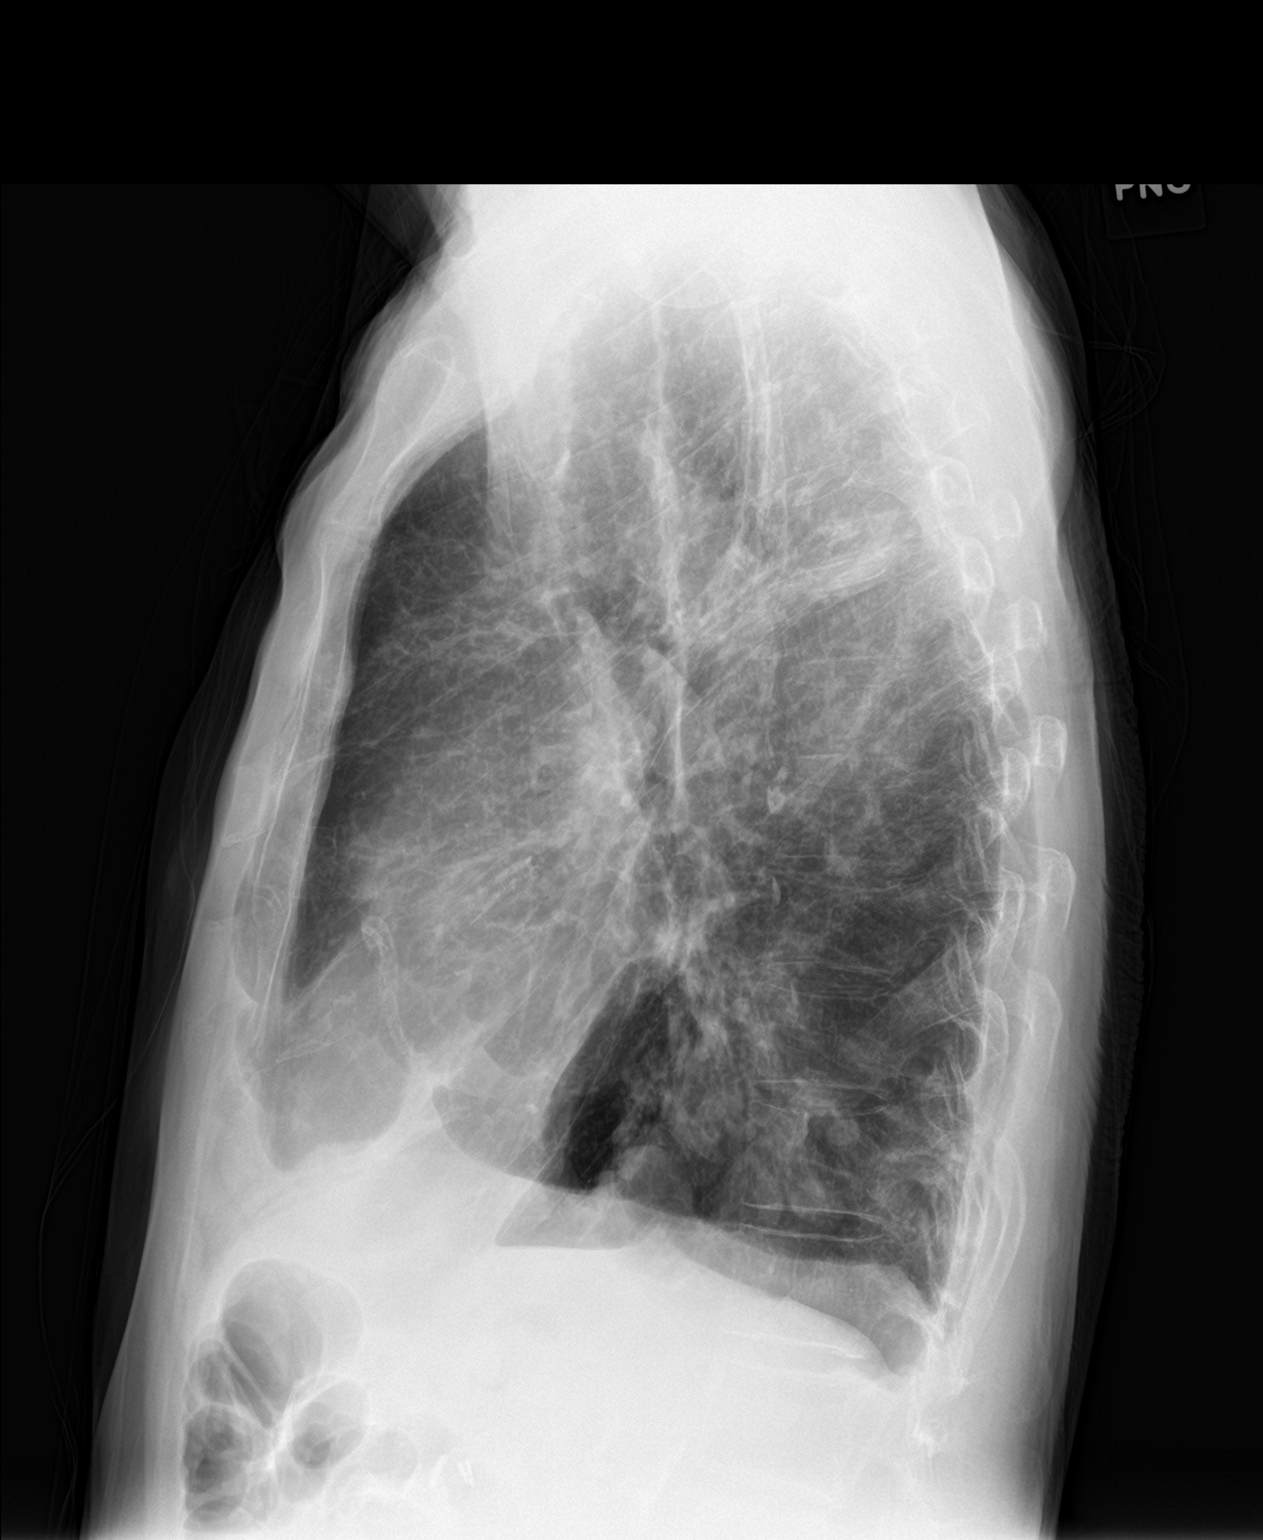

[2 of 2 positions shown; findings below may reference images not displayed]

FINDINGS: The cardiac silhouette is normal in size. Moderate-sized hiatal
hernia is similar to the prior study. The lungs remain hyperinflated
with chronic interstitial densities and bronchiectasis, similar in
appearance to multiple prior examinations including 06/09/2016. No
definite acute airspace opacity, edema, pleural effusion, or
pneumothorax is identified. No acute osseous abnormality is seen.
IMPRESSION: Chronic lung changes without evidence of acute abnormality.

## 2017-06-13 IMAGING — CR DG CHEST 1V PORT
2 series · 2 of 2 positions shown · non-contrast
Comparison: 10/18/2016

CLINICAL DATA: Shortness of breath for 1 week

EXAM:
PORTABLE CHEST 1 VIEW

[AP (1 of 2)]
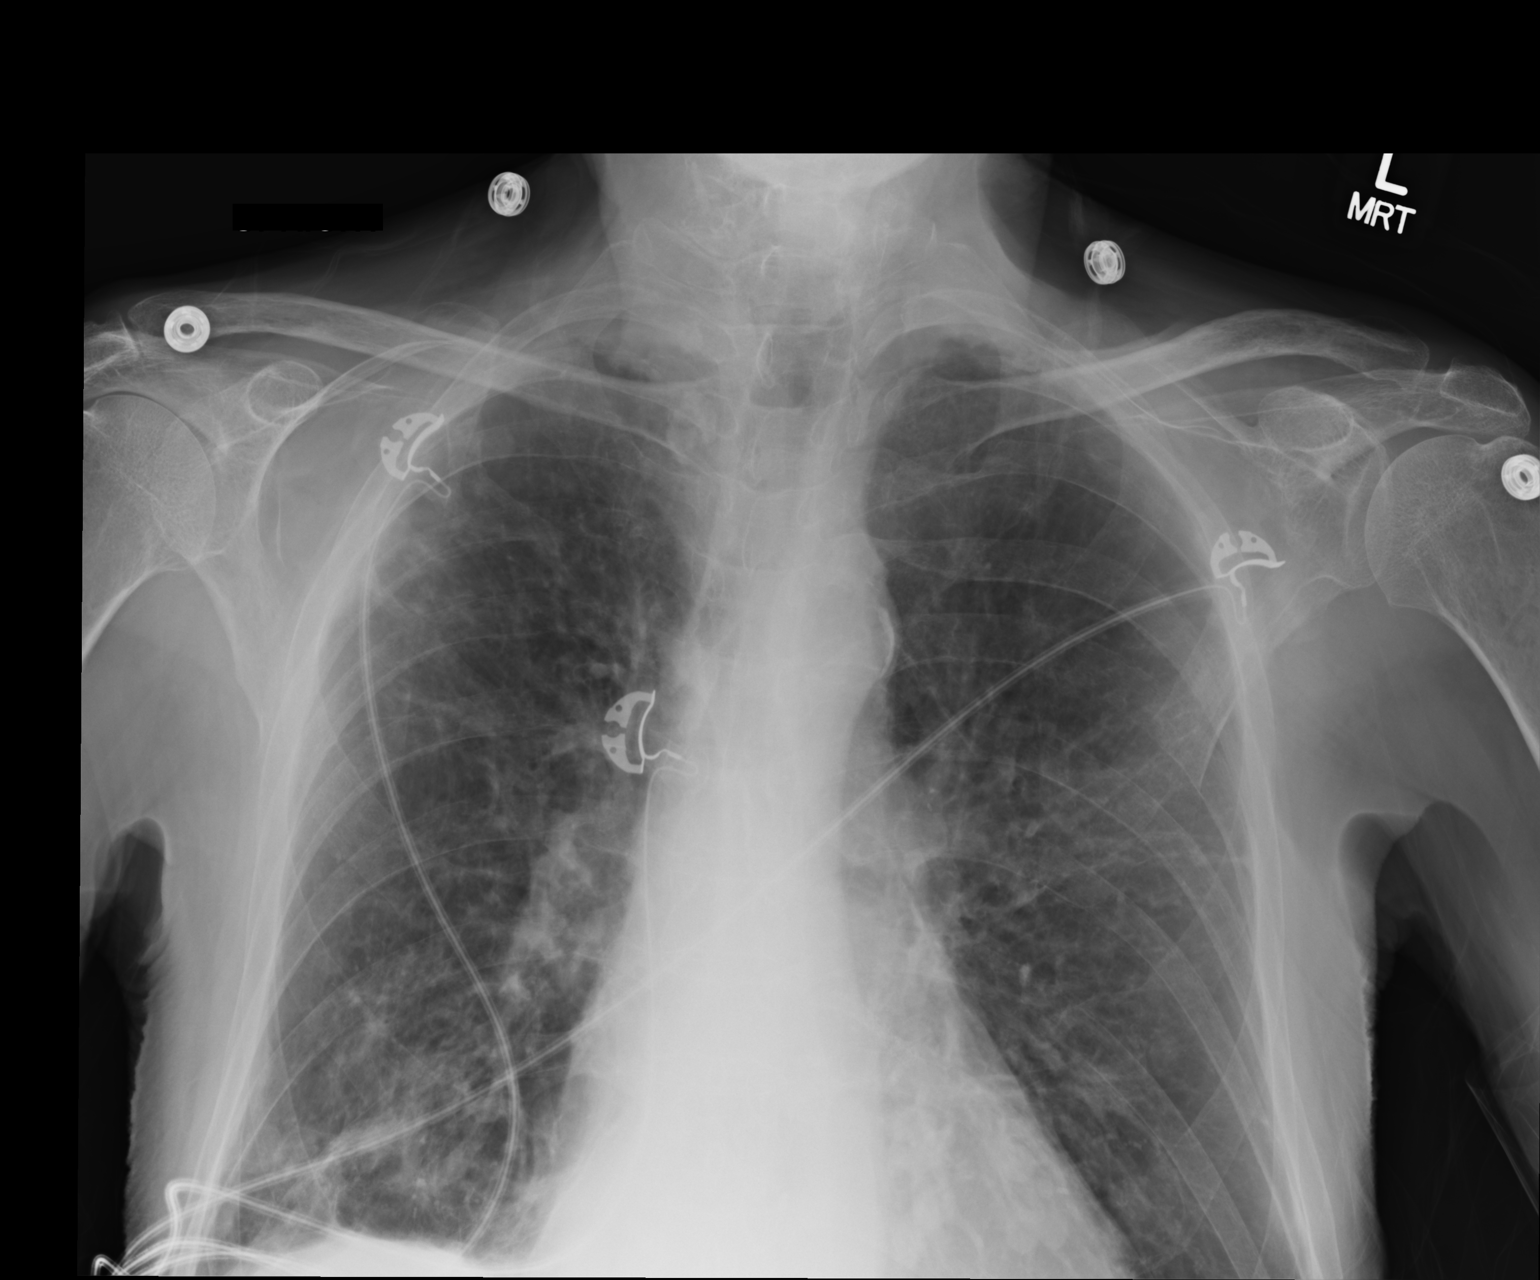

[AP (2 of 2)]
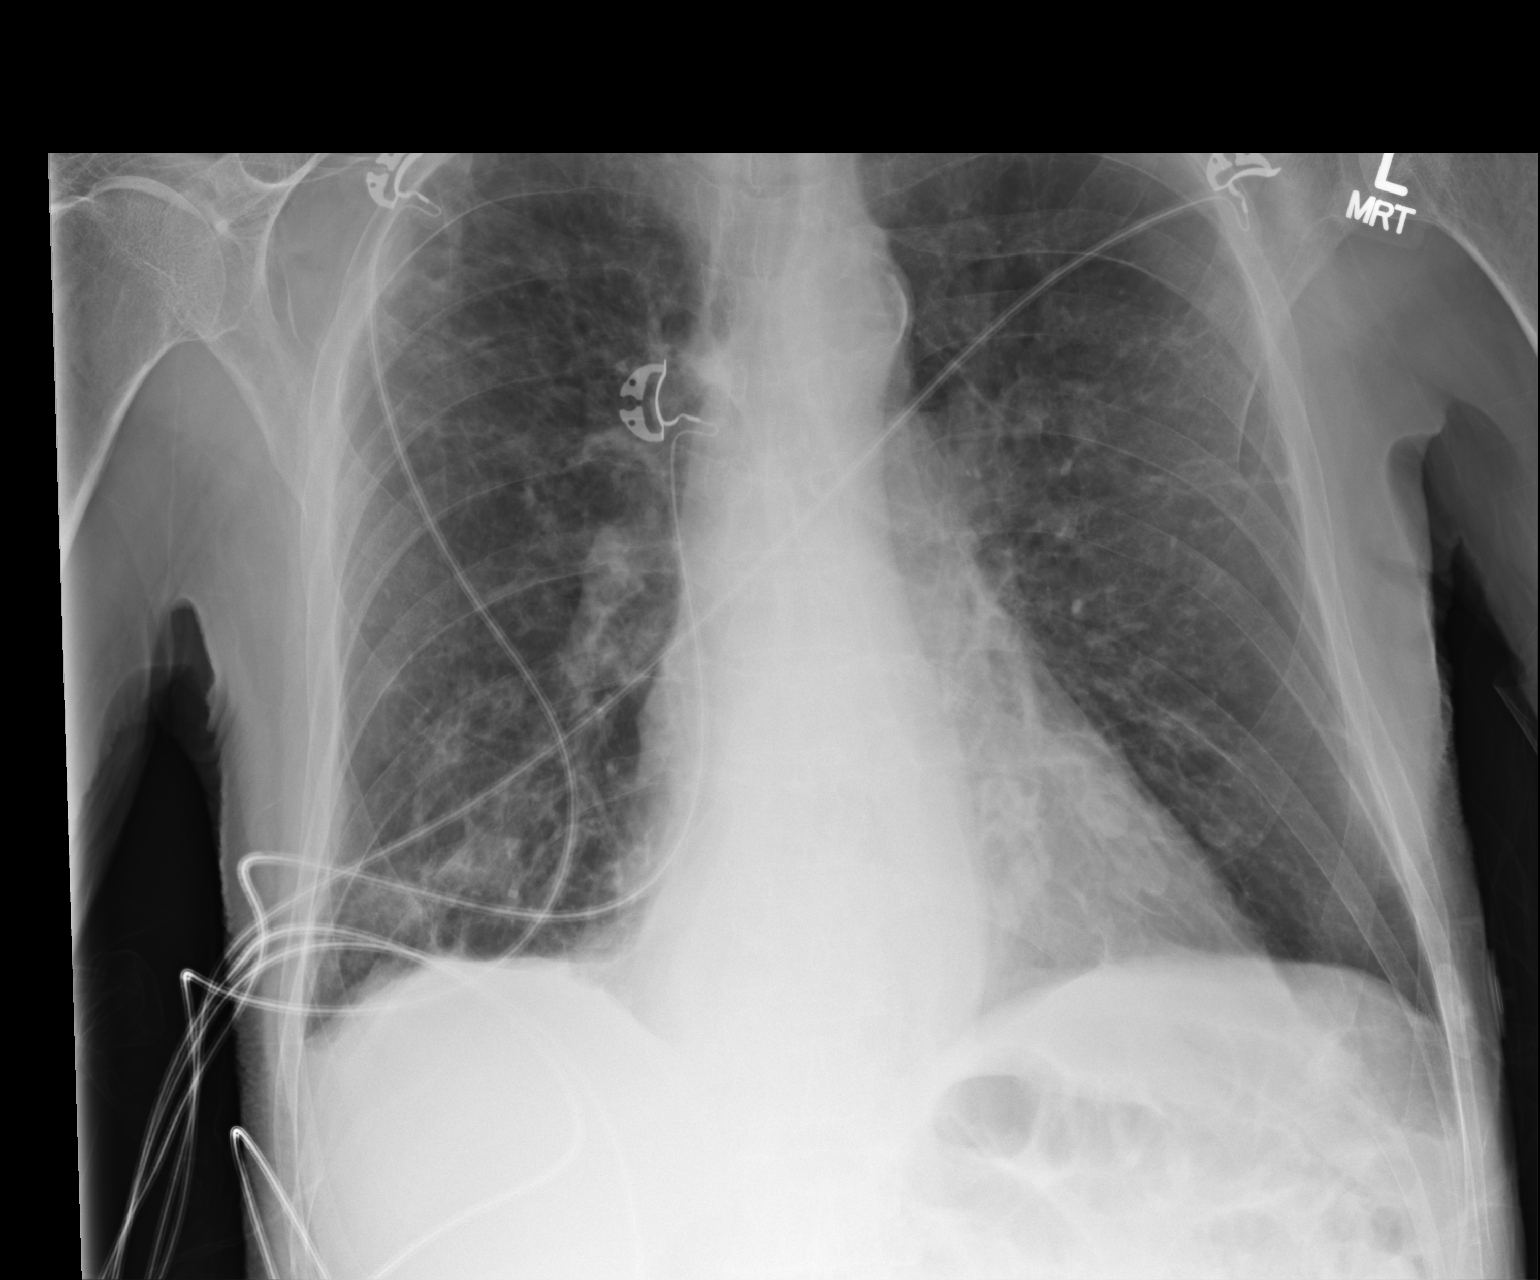

[2 of 2 positions shown; findings below may reference images not displayed]

FINDINGS: Cardiomediastinal silhouette is stable. Stable bronchiectasis and
reticulonodular interstitial prominence consistent with sequela from
chronic atypical mycobacterial infection. No definite superimposed
infiltrate or pulmonary edema.
IMPRESSION: Stable bronchiectasis and reticulonodular interstitial prominence
consistent with sequela from chronic atypical mycobacterial
infection. No definite superimposed infiltrate or pulmonary edema.

## 2017-06-15 IMAGING — DX DG CHEST 2V
2 series · 2 of 2 positions shown · non-contrast
Comparison: 11/01/2016

CLINICAL DATA: Dry cough that turned productive cough.

EXAM:
CHEST  2 VIEW

[chest pa]
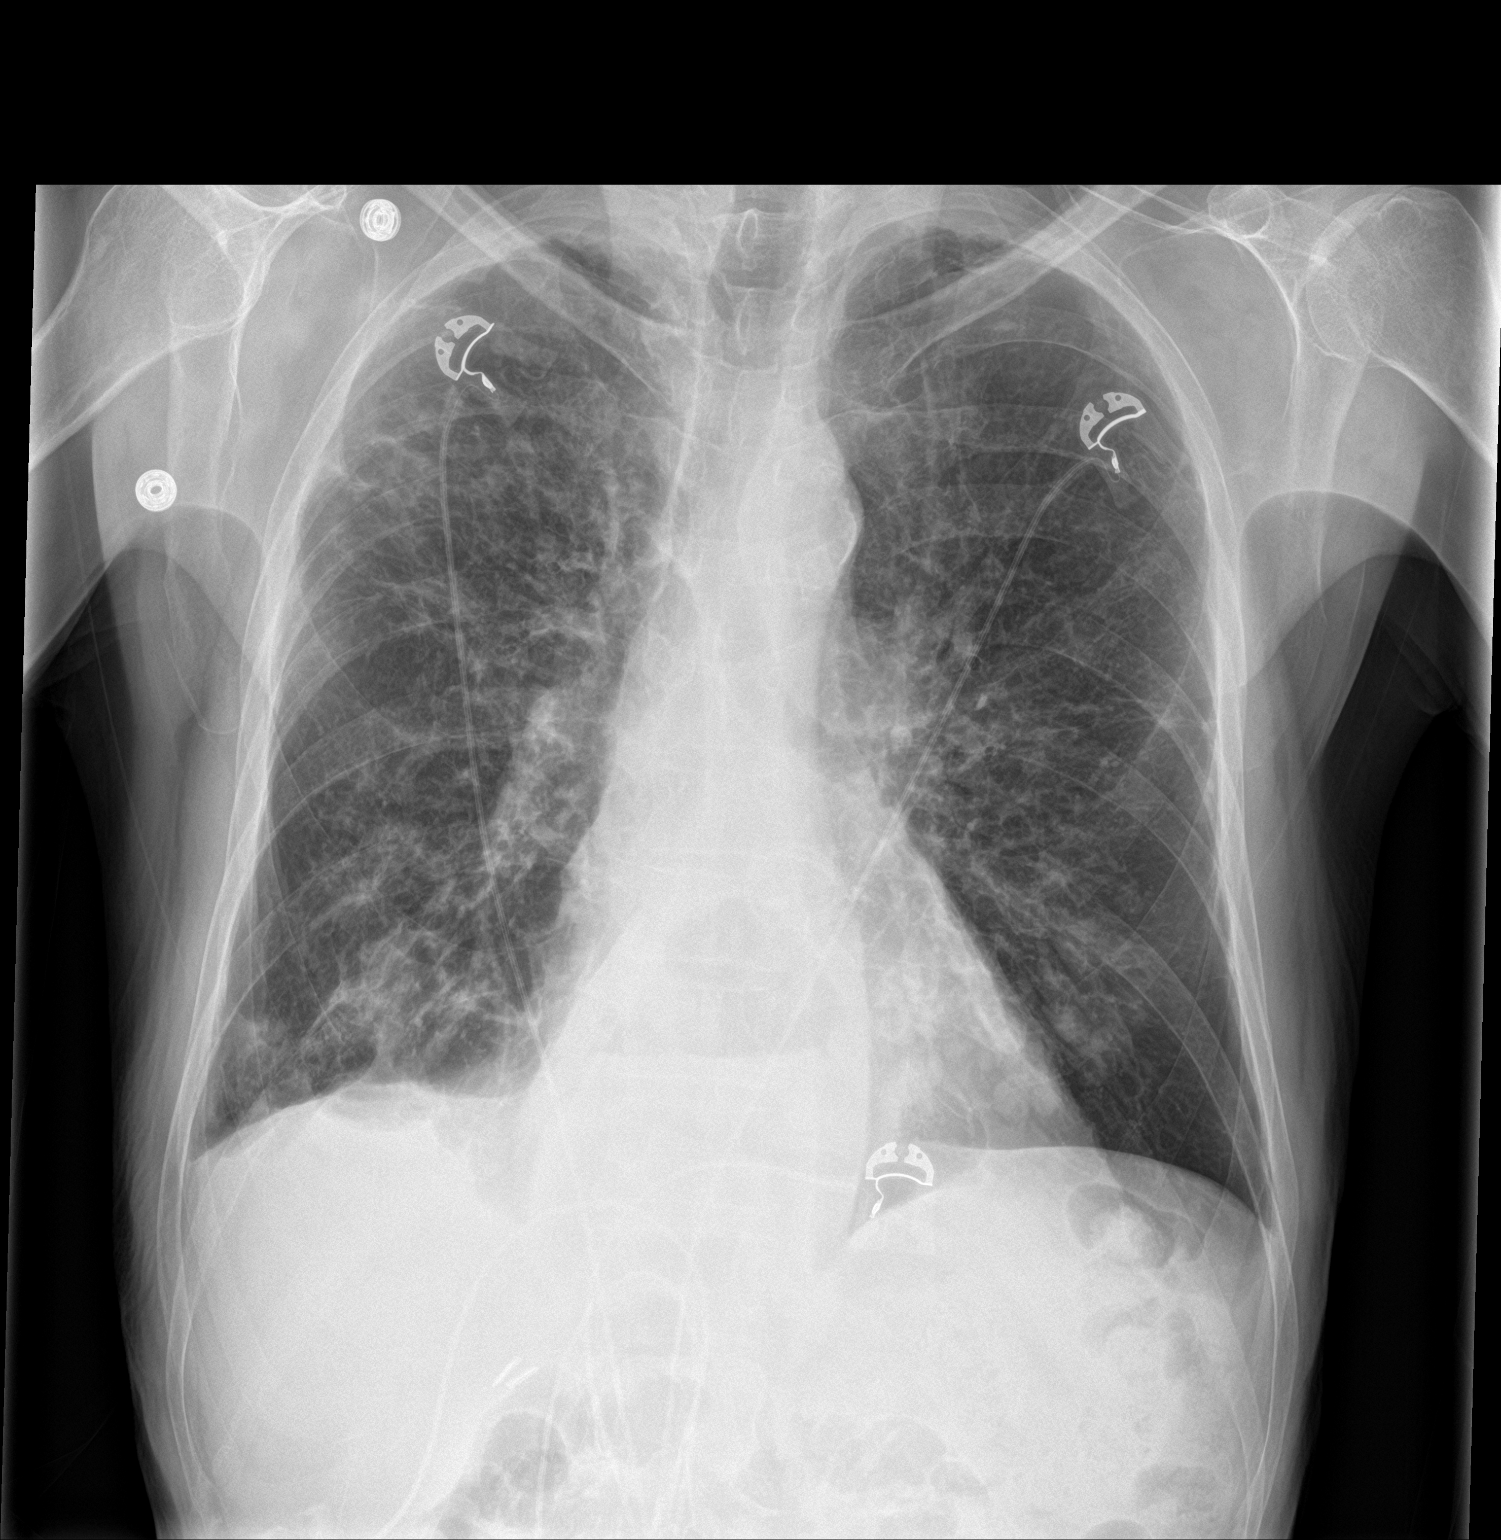

[chest lat]
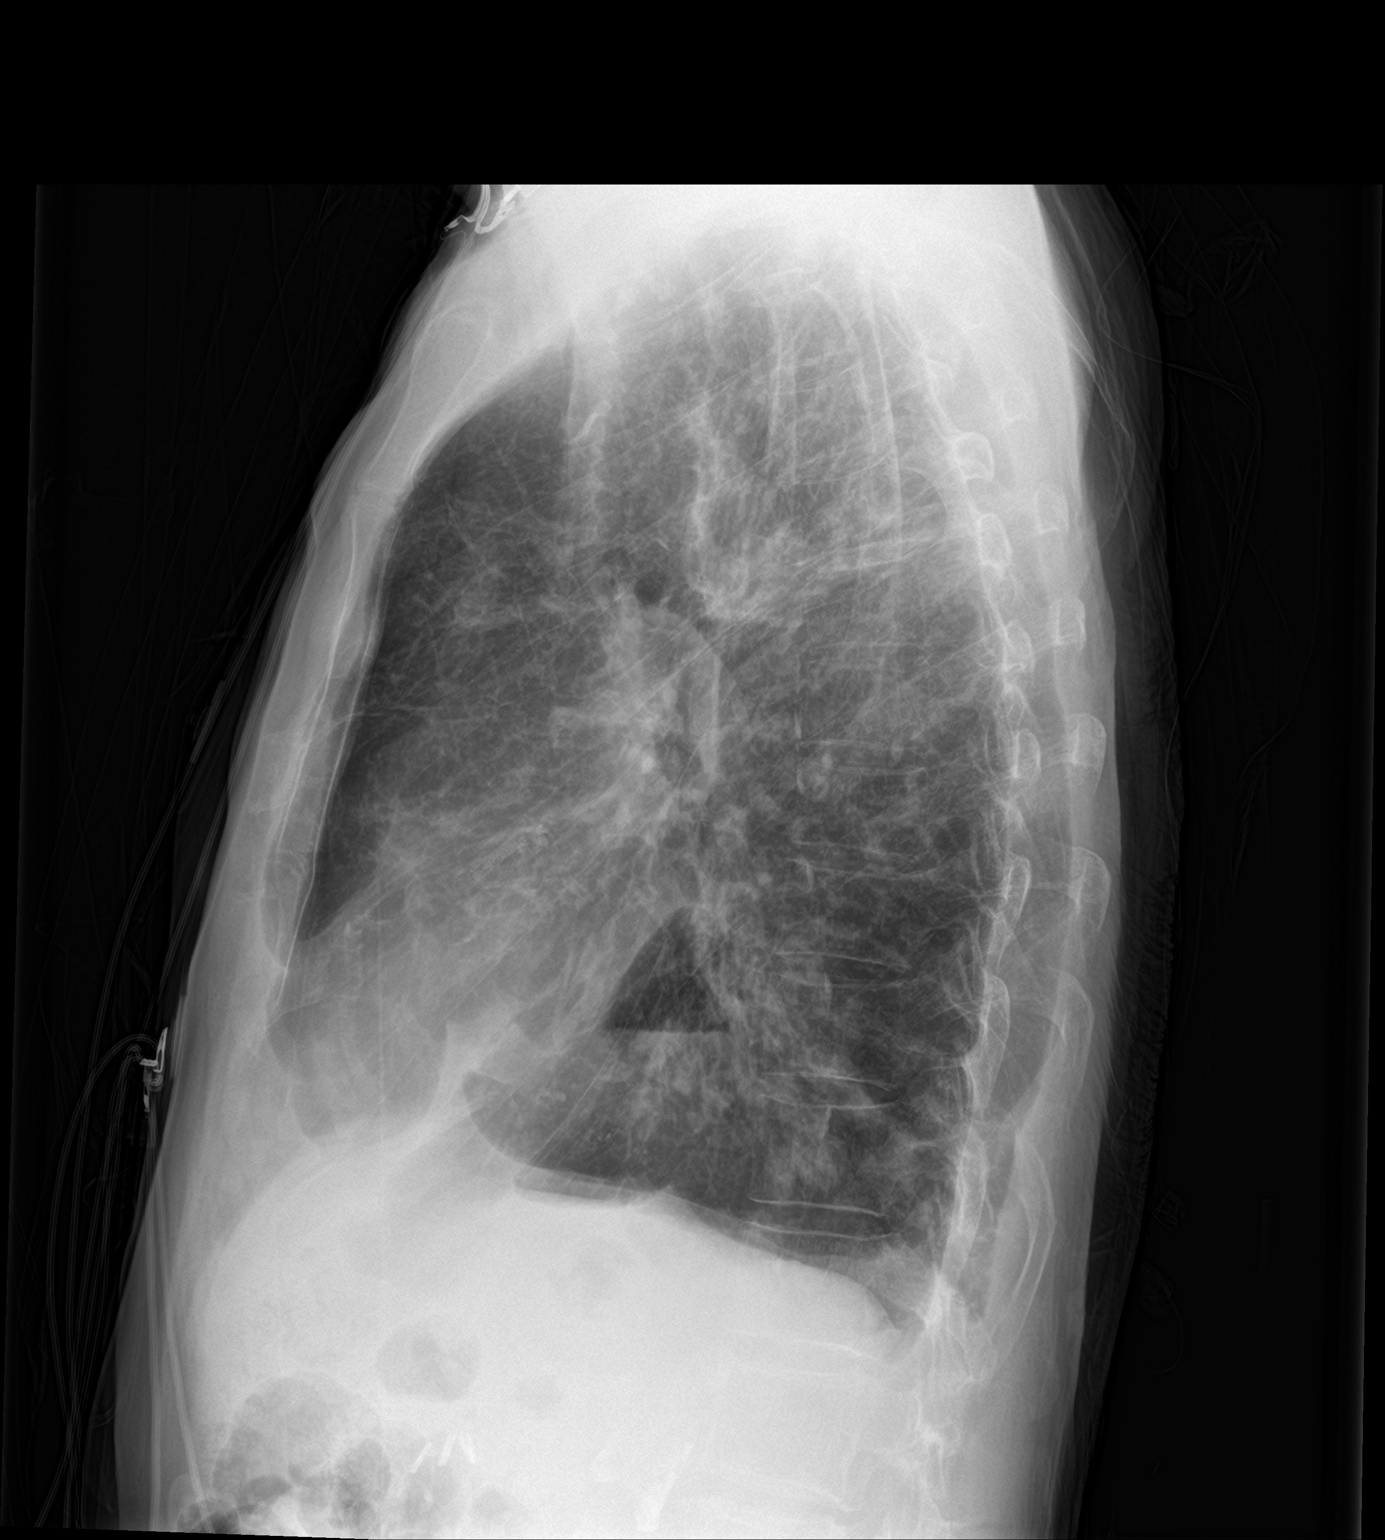

[2 of 2 positions shown; findings below may reference images not displayed]

FINDINGS: Normal heart size and aortic contours. Hiatal hernia with fluid
level.

Diffuse interstitial coarsening with reticulonodular opacities and
clustered nodular densities at the left base. By CT there are
findings of bronchiectasis and chronic airway infection. No
superimposed acute airspace disease. No edema, effusion, or
pneumothorax. Atherosclerosis.
IMPRESSION: 1. Chronic lung disease including bronchiectasis and mucoid
impaction. No acute finding, but superimposed infection could easily
be obscured.
2. Moderate hiatal hernia.

## 2017-07-14 IMAGING — CT CT ABD-PELV W/O CM
2 of 4 series · 15 of 46 positions shown, 17 images · non-contrast
Comparison: Abdominal CT dated 12/18/2013 and chest CT dated
07/30/2016

CLINICAL DATA: 80-year-old male with abdominal pain.

EXAM:
CT ABDOMEN AND PELVIS WITHOUT CONTRAST
TECHNIQUE: Multidetector CT imaging of the abdomen and pelvis was performed
following the standard protocol without IV contrast.

[Series 2: abd/pel w/o · axial · non-contrast · 0.74mm/px · z∈[+921,+1306]mm · 12 of 87 slices shown, 14 images]
[im 5/87  soft-tissue]
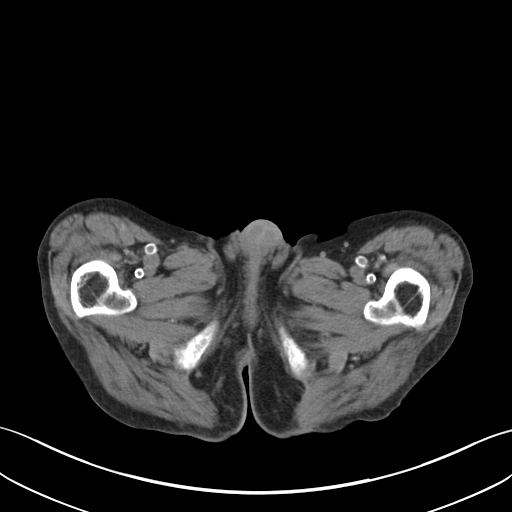
[im 5/87  bone]
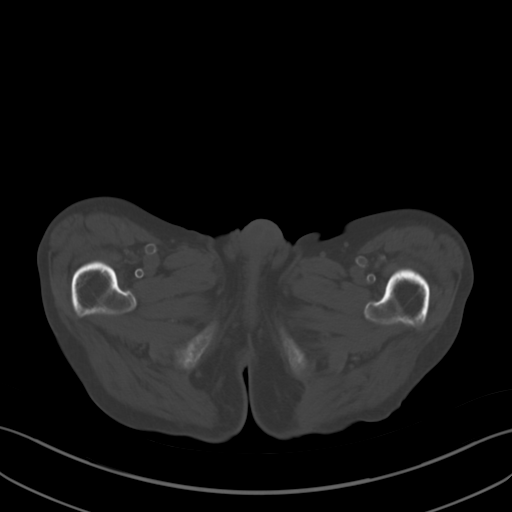
[im 13/87  soft-tissue]
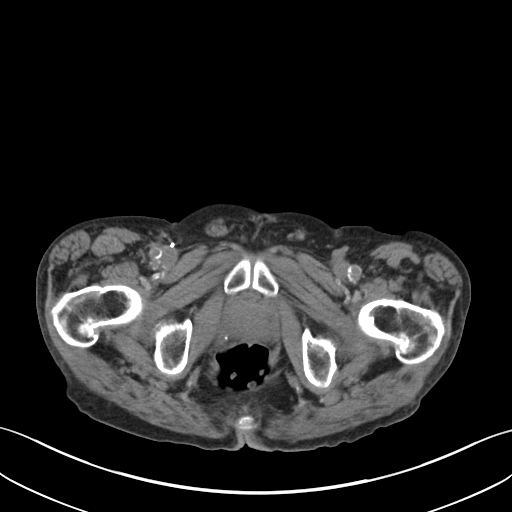
[im 18/87  soft-tissue]
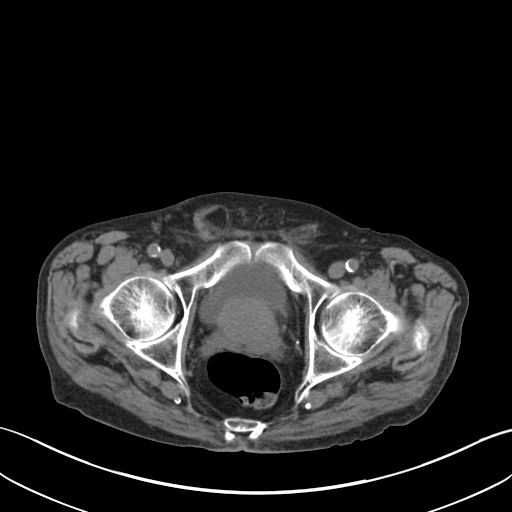
[im 26/87  soft-tissue]
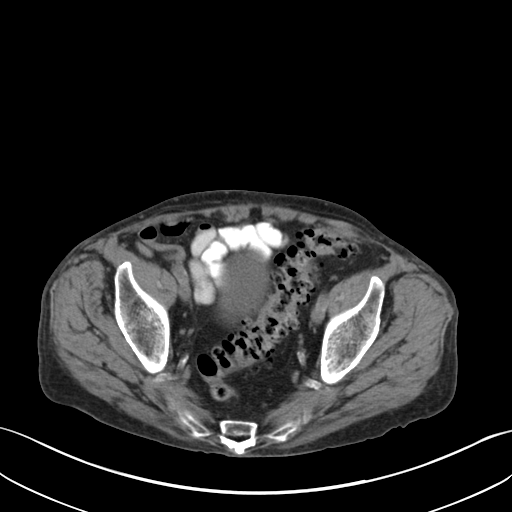
[im 35/87  soft-tissue]
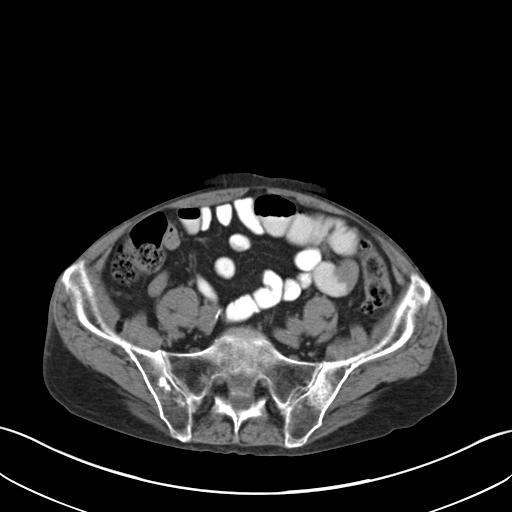
[im 39/87  soft-tissue]
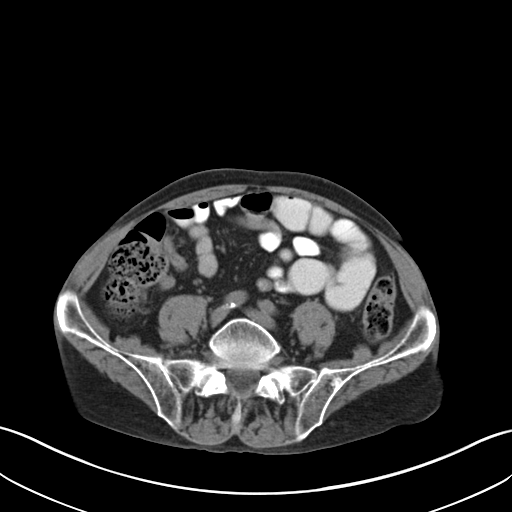
[im 48/87  soft-tissue]
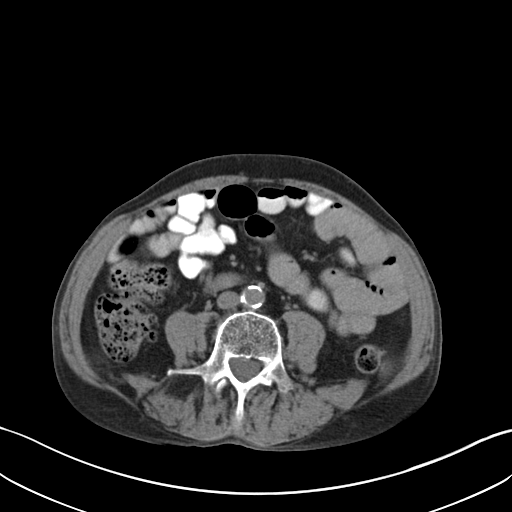
[im 52/87  soft-tissue]
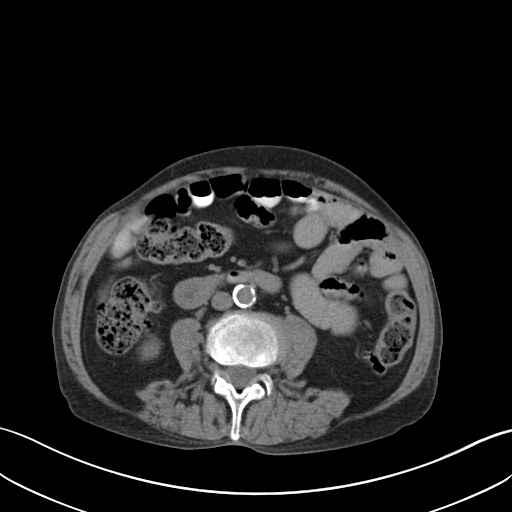
[im 61/87  soft-tissue]
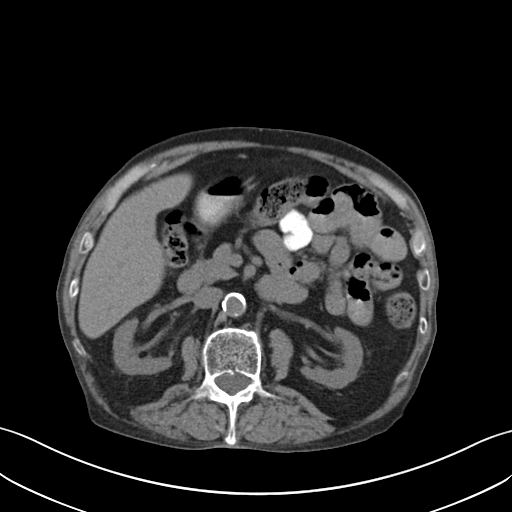
[im 61/87  bone]
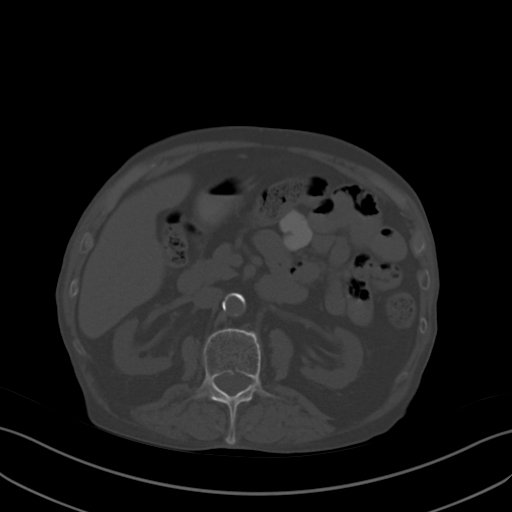
[im 69/87  soft-tissue]
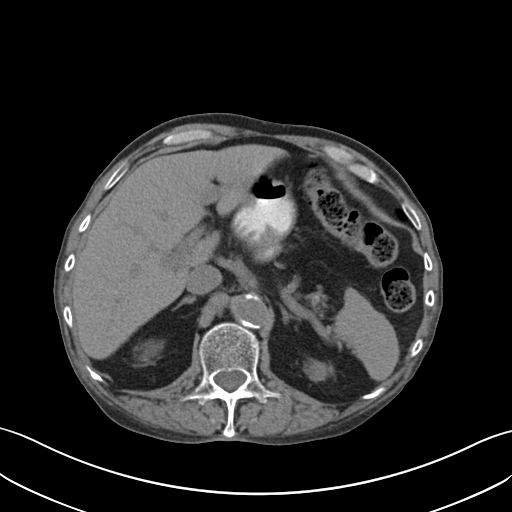
[im 74/87  soft-tissue]
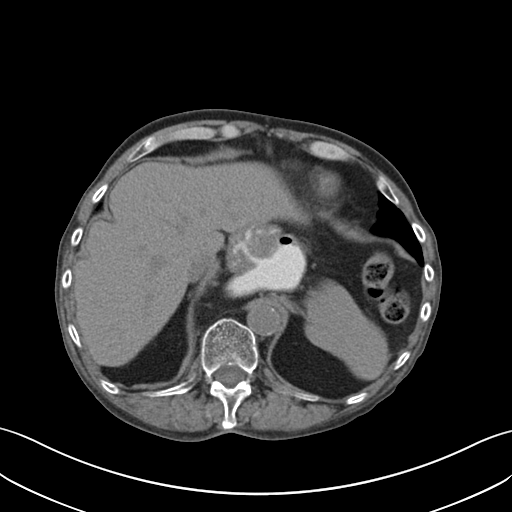
[im 82/87  soft-tissue]
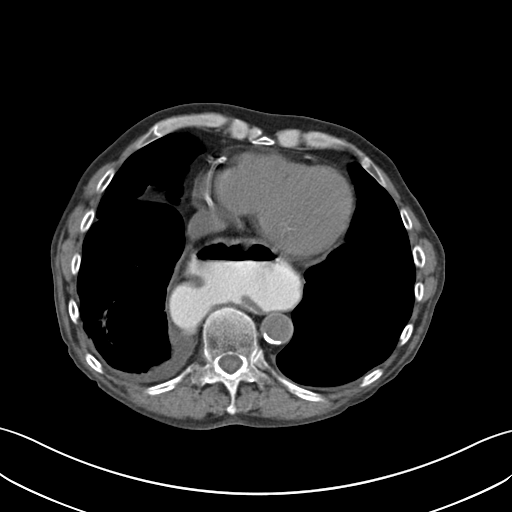

[Series 3: coronal · coronal · 0.71mm/px · 3 of 161 slices shown]
[im 54/161  soft-tissue]
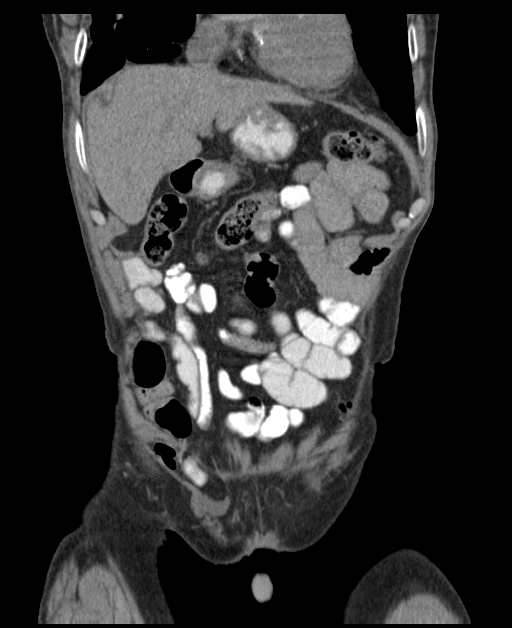
[im 72/161  soft-tissue]
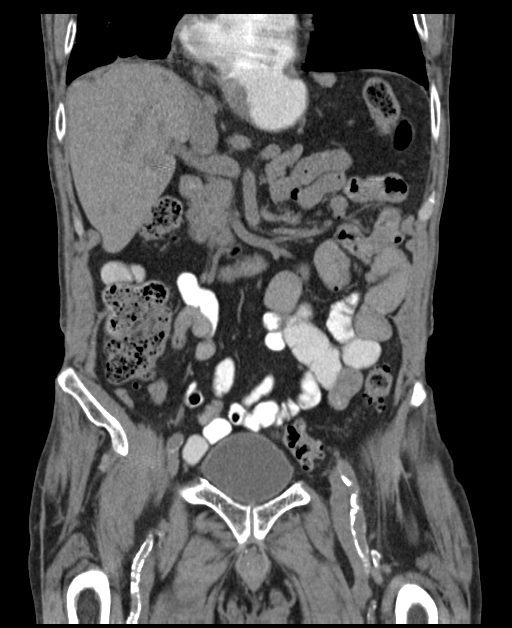
[im 89/161  soft-tissue]
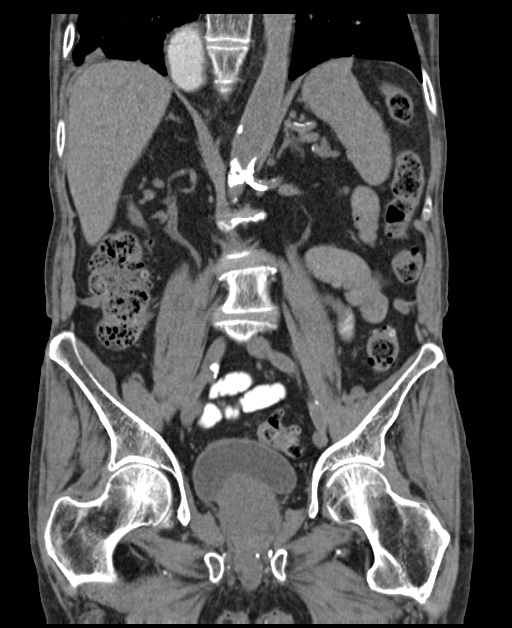

[15 of 46 positions shown; findings below may reference images not displayed]

FINDINGS: Evaluation of this exam is limited in the absence of intravenous
contrast.

Lower chest: There are bronchiectatic changes of the lung bases with
a patchy area of scarring at the right lung base. Nodular density
primary anterior left lung base likely represent mucous plugging.
There is a small right pleural effusion as seen on the CT of
07/30/2016. There is multi vessel coronary vascular calcification.

There is no intra-abdominal free air or free fluid.

Hepatobiliary: Cholecystectomy.  The liver is unremarkable.

Pancreas: Unremarkable. No pancreatic ductal dilatation or
surrounding inflammatory changes.

Spleen: Normal in size without focal abnormality.

Adrenals/Urinary Tract: The adrenal glands are unremarkable.
Multiple nonobstructing bilateral renal calculi measure up to 5 mm.
There is no hydronephrosis or obstructing stone. The urinary bladder
is unremarkable.

Stomach/Bowel: There is a large hiatal hernia. There is no evidence
of bowel obstruction or active inflammation. There is extensive
sigmoid diverticulosis with muscular hypertrophy. No active
inflammatory changes. Moderate stool noted throughout the colon.
There scattered colonic diverticula in the proximal colon. The
appendix is normal.

Vascular/Lymphatic: There is advanced aortoiliac atherosclerotic
disease. There is a 2.3 cm infrarenal aortic ectasia. Evaluation of
the vasculature is limited in the absence of intravenous contrast.
No portal venous gas identified. There is no adenopathy.

Reproductive: There is enlargement of the prostate gland with median
lobe hypertrophy causing indentation of the base of the bladder.

Other: There is a small right inguinal hernia containing small
amount of mesenteric fat. There is herniation of the short segment
of a small-bowel in the right inguinal canal without evidence of
obstruction. A second small short segment herniated loop of bowel
noted lateral to the right inguinal canal without associated
inflammation or obstruction.

Musculoskeletal: There is osteopenia with degenerative changes of
the spine. No acute osseous pathology. Choose
IMPRESSION: 1. No acute intra-abdominopelvic pathology. No evidence of bowel
obstruction or active inflammation. Normal appendix.
2. Extensive sigmoid diverticulosis without active inflammatory
changes.
3. Large hiatal hernia as well as small right inguinal hernia.
4. Small nonobstructing bilateral renal calculi.  No hydronephrosis.
5. Aortic atherosclerotic disease and a 2.3 cm infrarenal aortic
ectasia.
6. Bronchiectatic changes of the lung bases with mucous plugging.
Small right pleural effusion.

## 2017-07-14 IMAGING — CR DG RIBS W/ CHEST 3+V BILAT
7 series · 7 of 7 positions shown · non-contrast
Comparison: 11/03/2016 CXR 10 and chest CT 07/30/2016

CLINICAL DATA: Bilateral posterior rib pain since this morning
without known injury. History of COPD and asthma. Chronic
bronchitis.

EXAM:
BILATERAL RIBS AND CHEST - 4+ VIEW

[w chest pa]
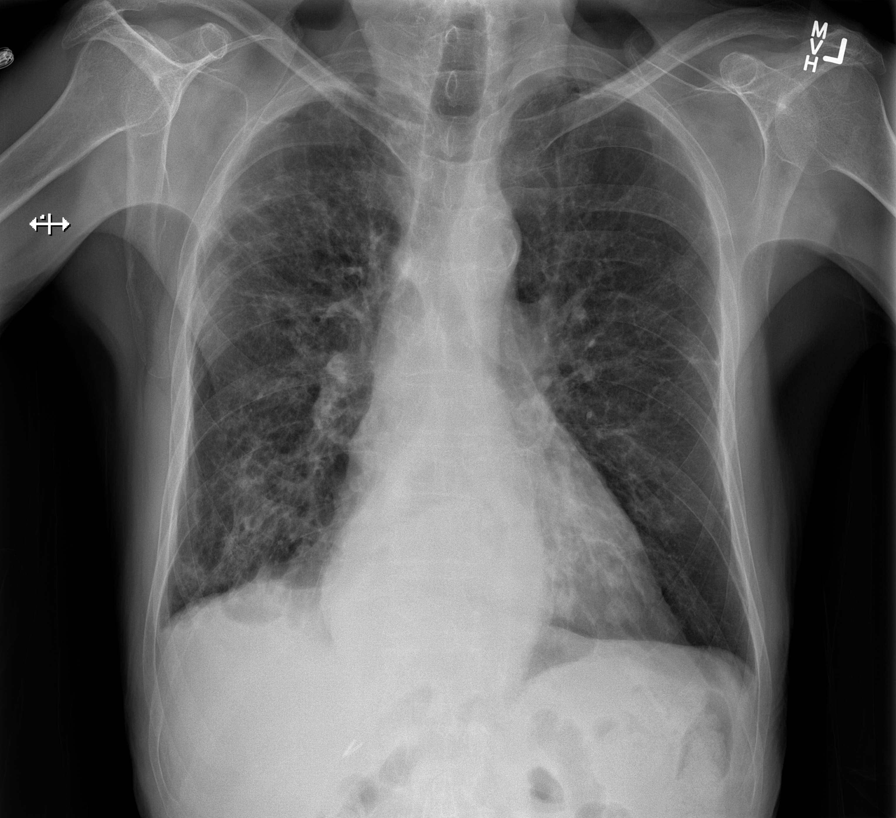

[w ribs pa upper right]
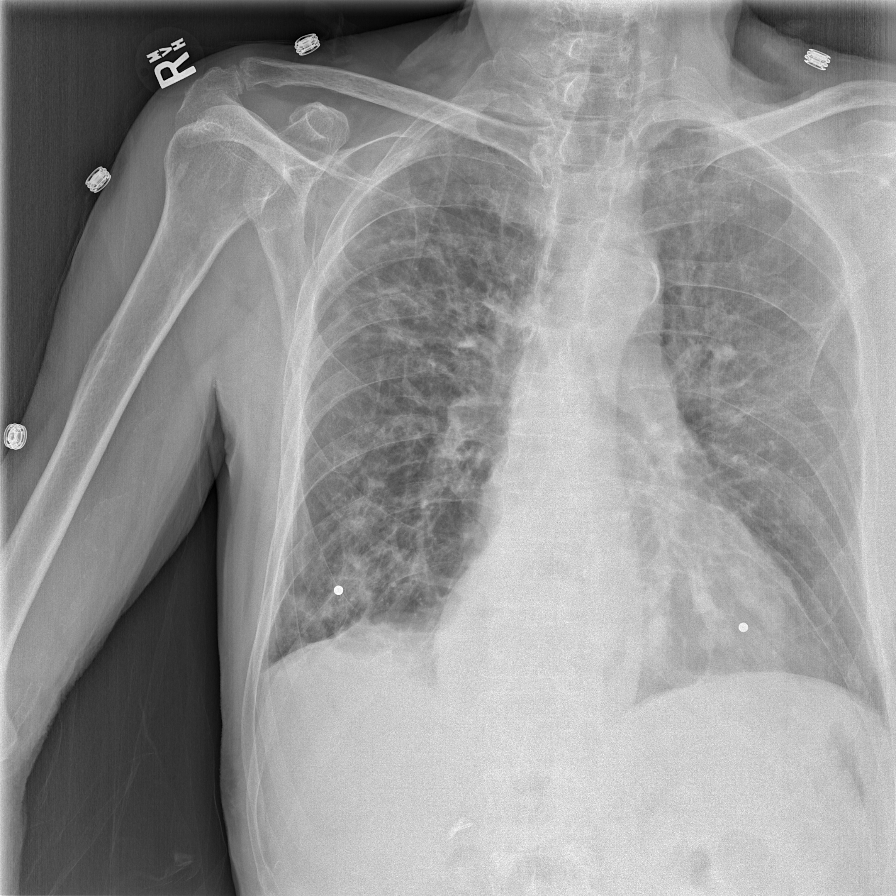

[w ribs pa lower right]
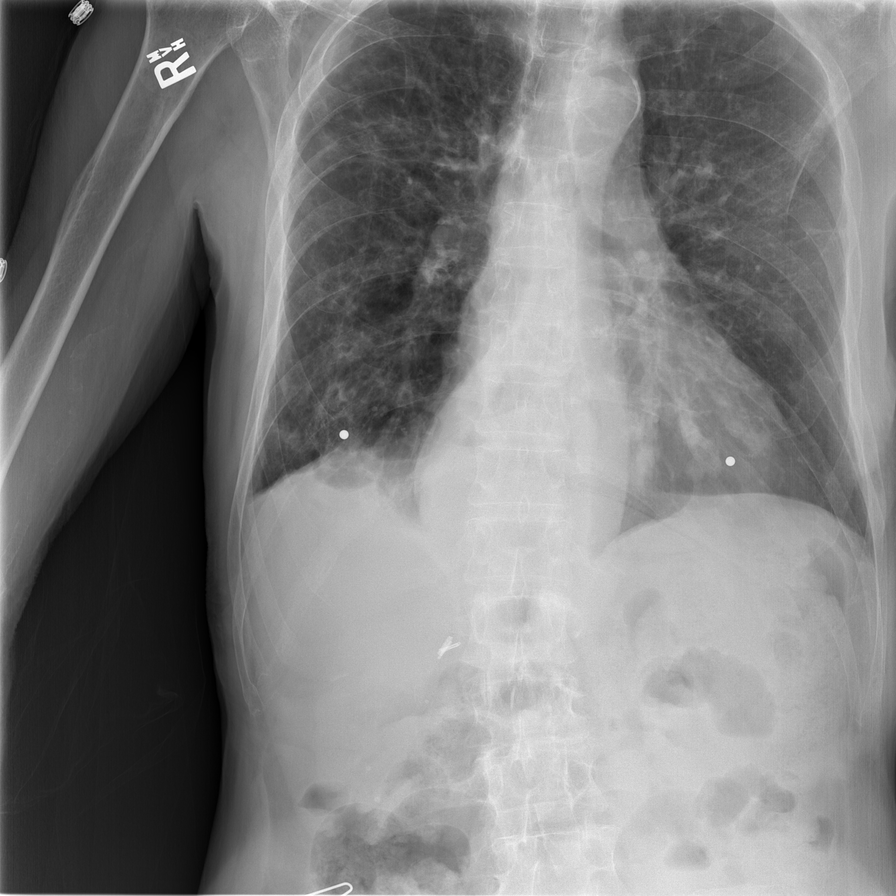

[w ribs obl right]
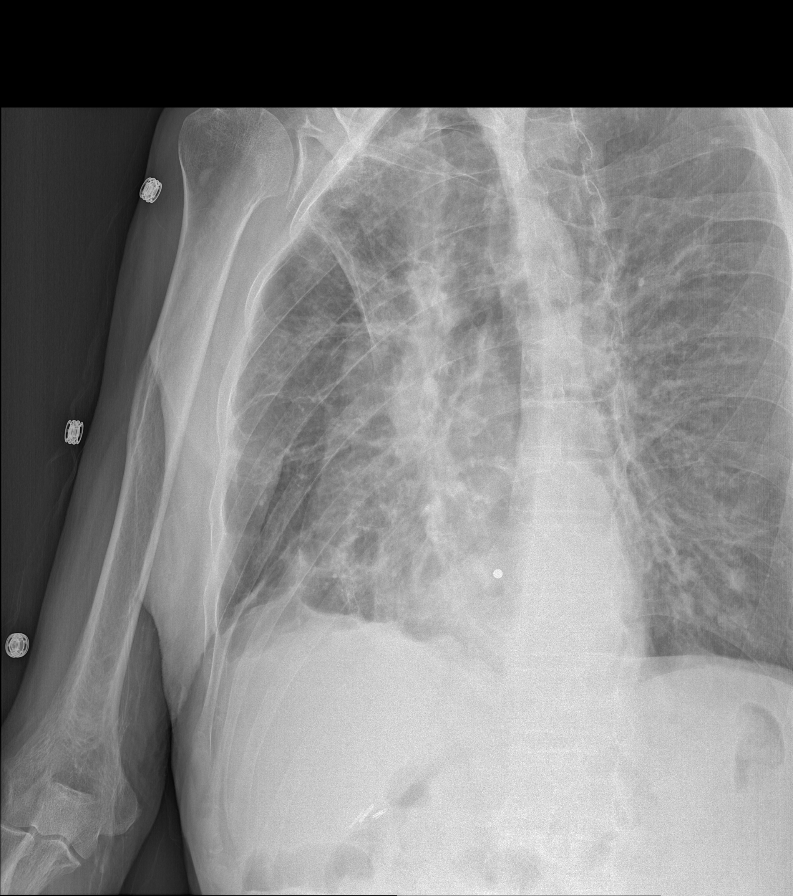

[w ribs ap upper left]
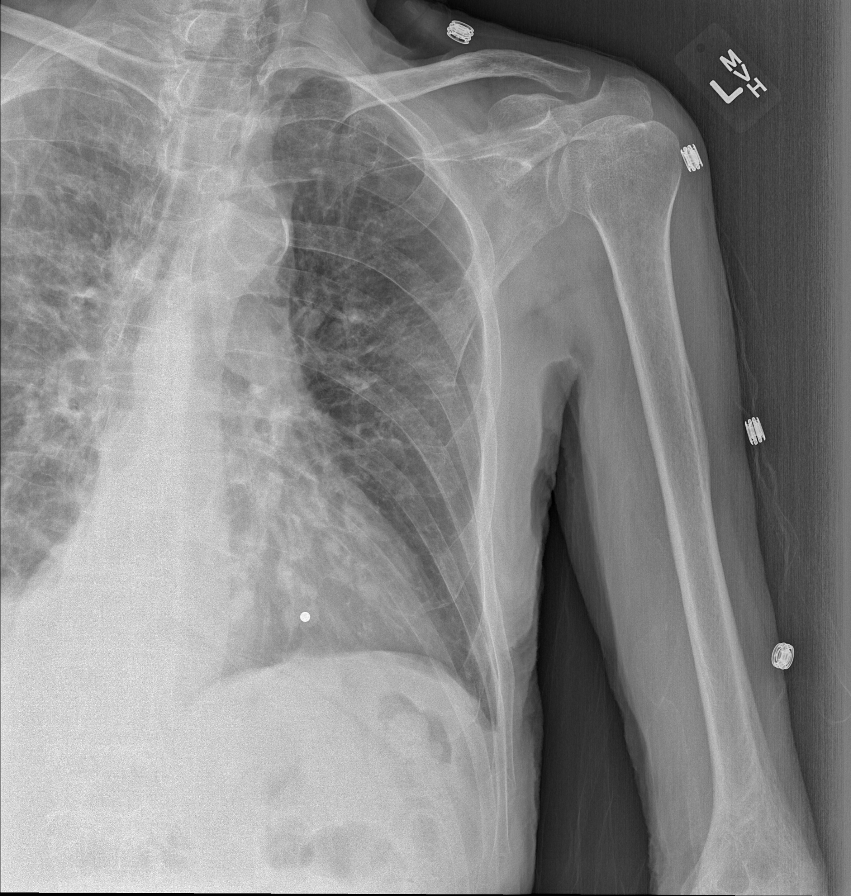

[w ribs ap lower left]
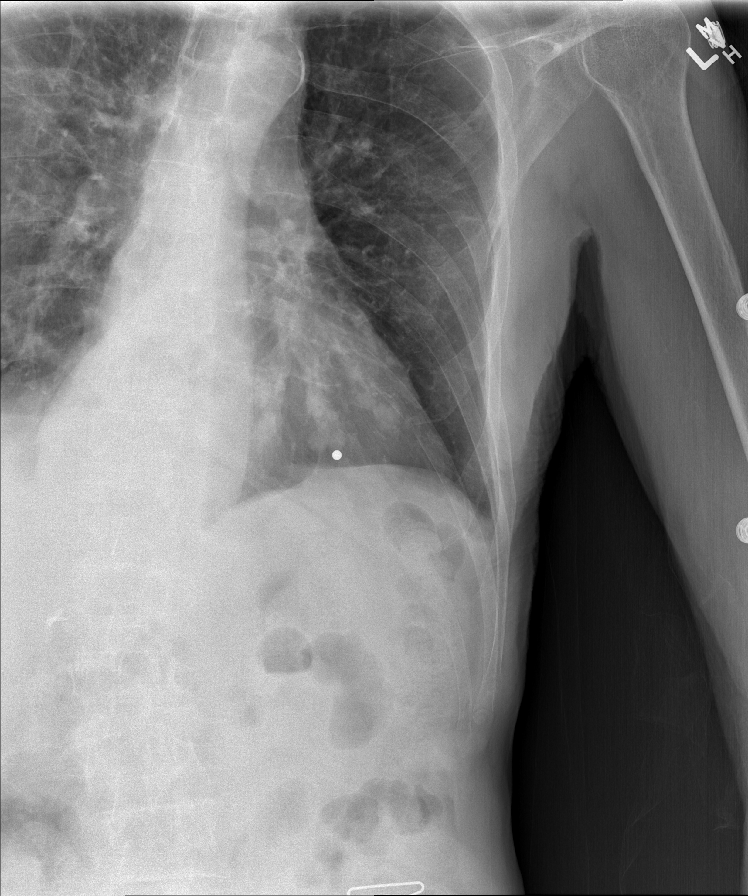

[w ribs obl left]
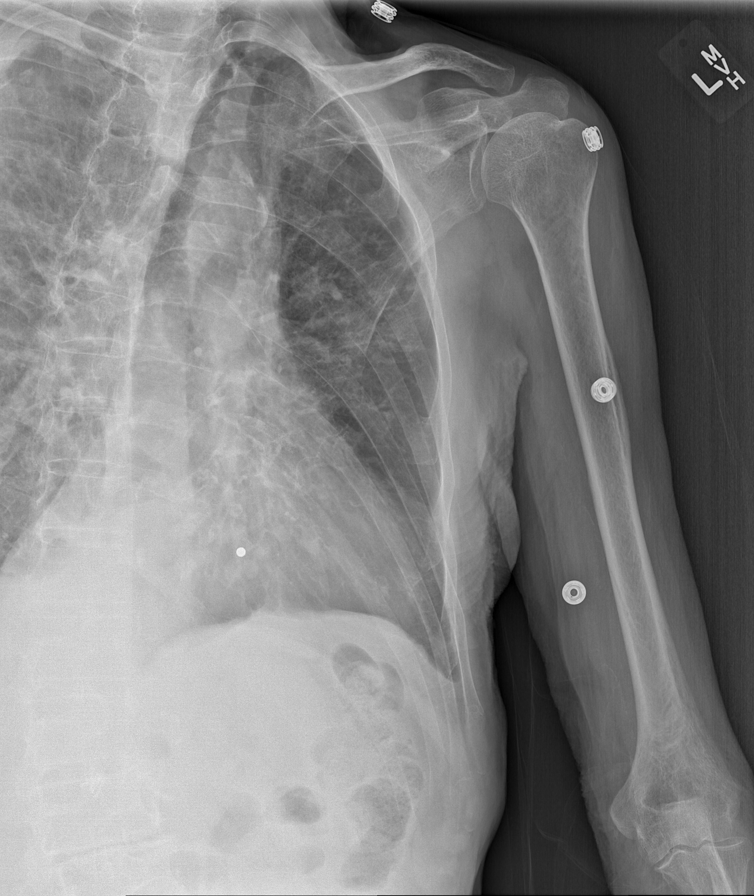

[7 of 7 positions shown; findings below may reference images not displayed]

FINDINGS: Moderate sized hiatal hernia projects over the normal sized cardiac
silhouette as before. Aortic atherosclerosis without aneurysm is
identified. Diffuse interstitial coarsening with tubular and nodular
opacities at the left lung base are again visualized without
significant change. By CT, these densities at the left lung base are
consistent bronchiectasis and probable areas of mucous impaction. No
superimposed acute airspace disease. No pulmonary edema, effusion or
pneumothorax. Cholecystectomy clips are noted. No acute rib fracture
is seen.
IMPRESSION: 1. Stable moderate-sized hiatal hernia.
2. Chronic interstitial lung disease with bronchiectasis and
probable areas of mucoid impaction especially at the left lung base
given the tubular finger-like opacities.
3. No acute displaced appearing rib fracture is noted.

## 2017-08-11 IMAGING — MR MR THORACIC SPINE W/O CM
4 of 6 series · 14 of 48 positions shown · non-contrast
Comparison: PA and lateral chest 11/03/2016.  CT chest 07/30/2016.

CLINICAL DATA: Thoracic spine compression fracture. Mid back pain
for 2 weeks.

EXAM:
MRI THORACIC SPINE WITHOUT CONTRAST
TECHNIQUE: Multiplanar, multisequence MR imaging of the thoracic spine was
performed. No intravenous contrast was administered.

[Series 4: T2 · sagittal · 4.0mm · 0.36mm/px · 5 of 12 slices shown (1 of 3)]
[im 1/12]
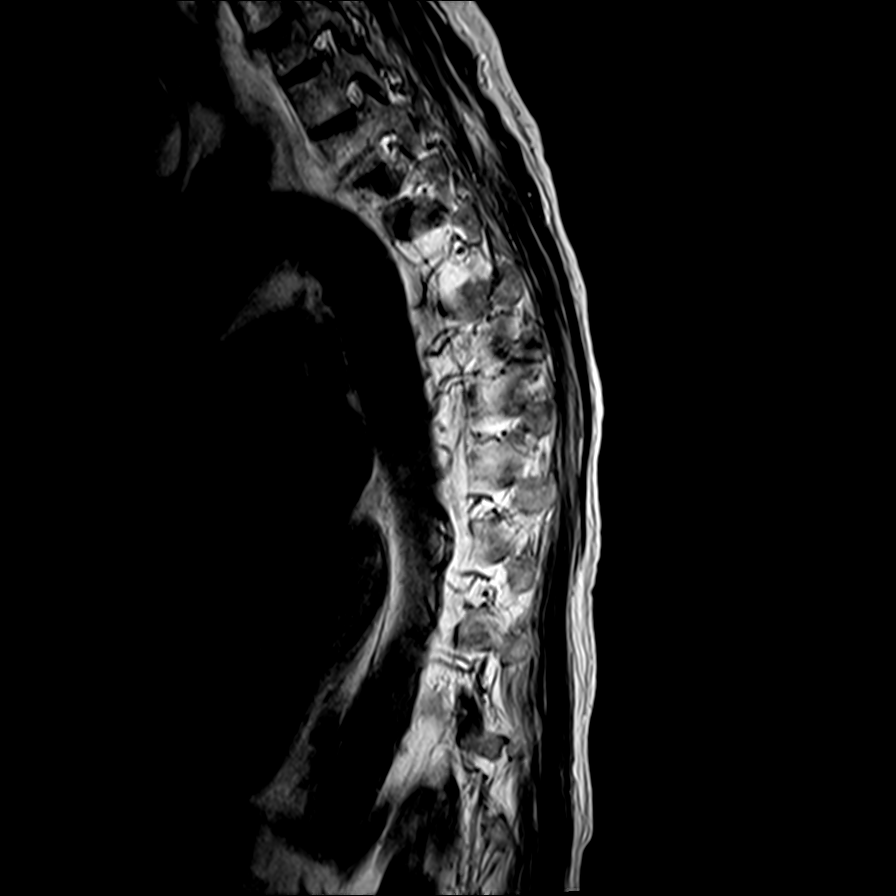
[im 3/12]
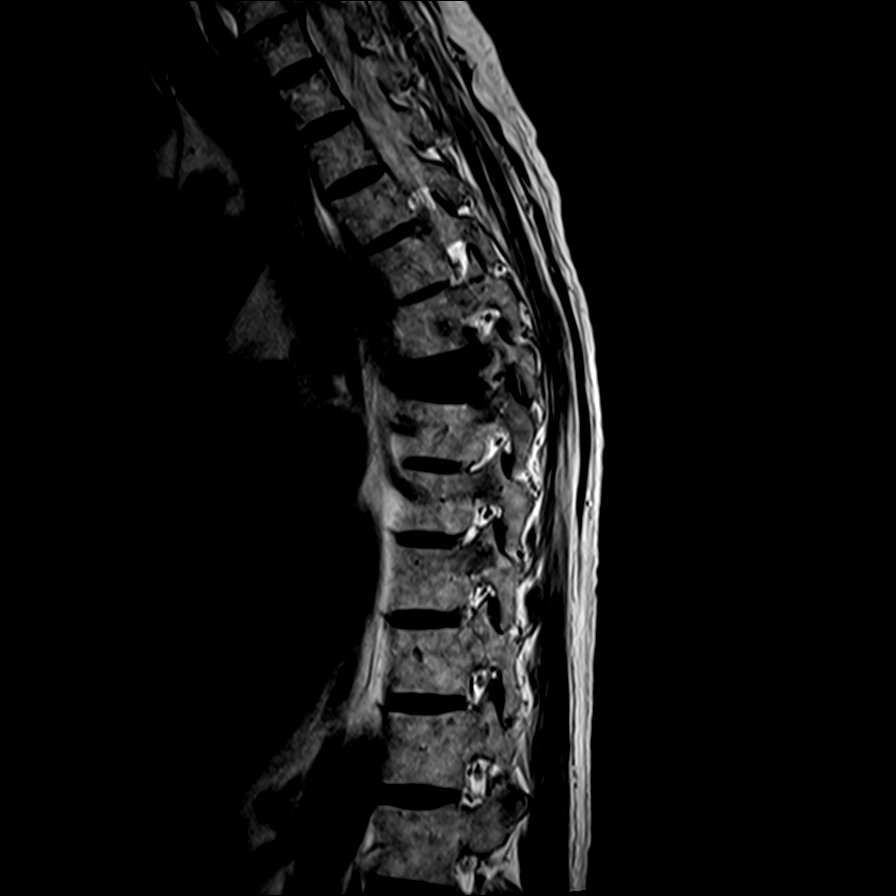
[im 6/12]
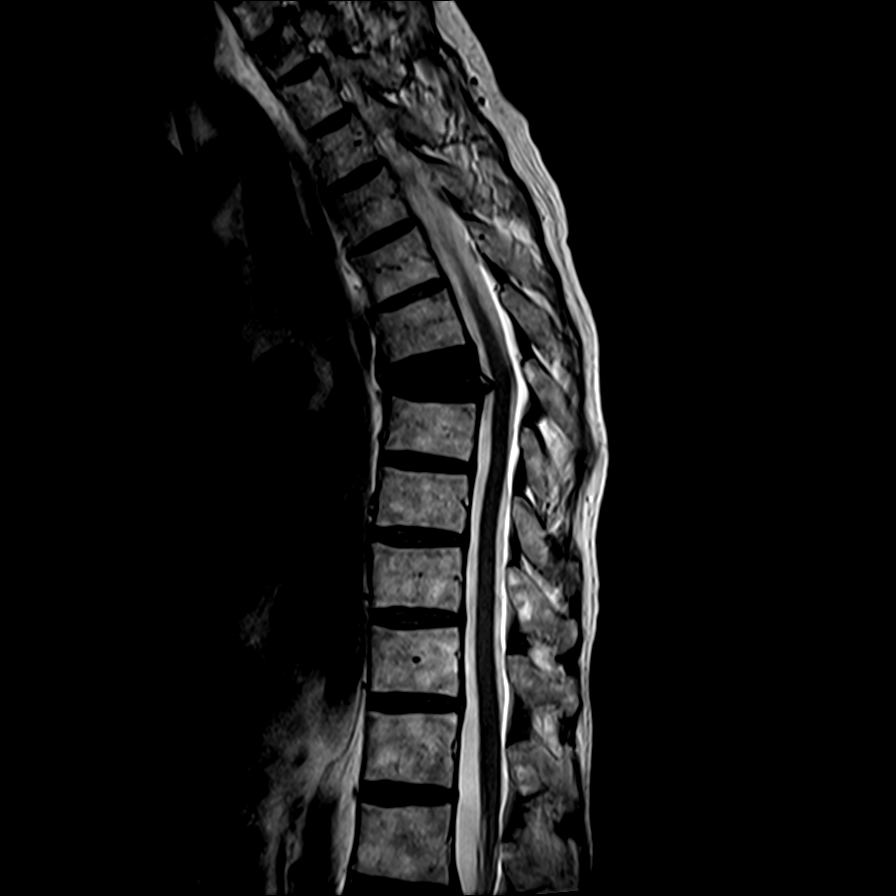
[im 9/12]
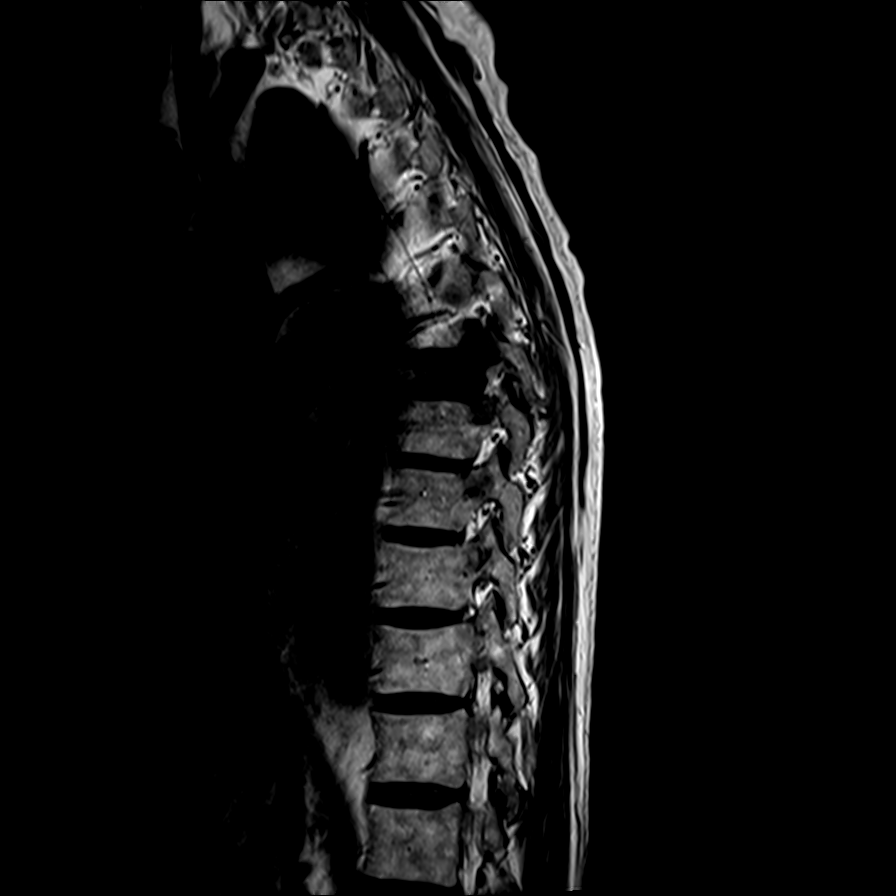
[im 12/12]
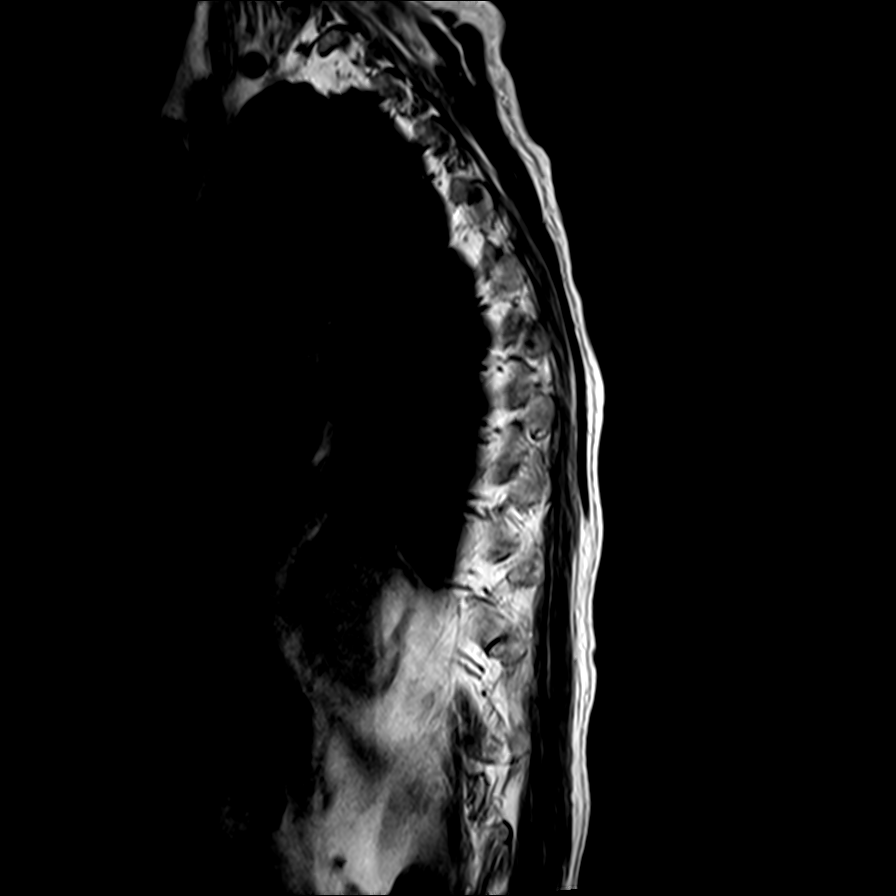

[Series 5: T1 · sagittal · 4.0mm · 0.83mm/px · 3 of 12 slices shown]
[im 1/12]
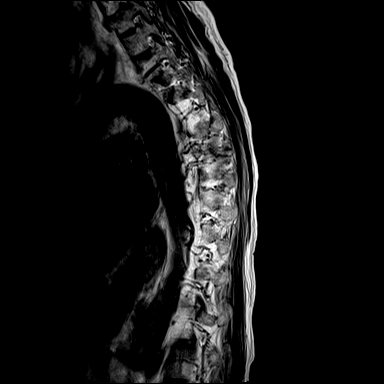
[im 6/12]
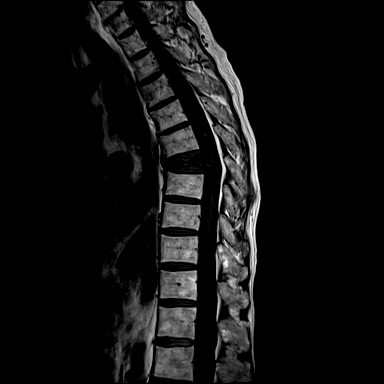
[im 12/12]
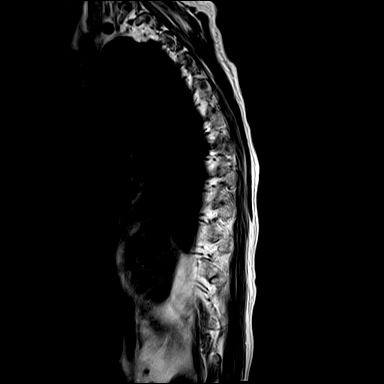

[Series 7: T2 · axial · 4.0mm · 0.52mm/px · z∈[-255,-84]mm · 3 of 39 slices shown (2 of 3)]
[im 6/39]
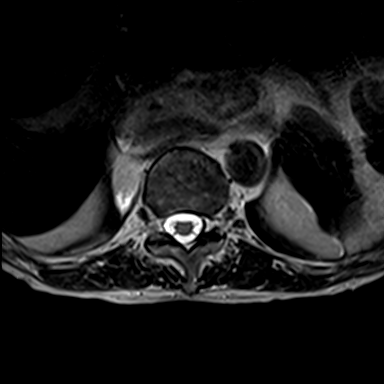
[im 20/39]
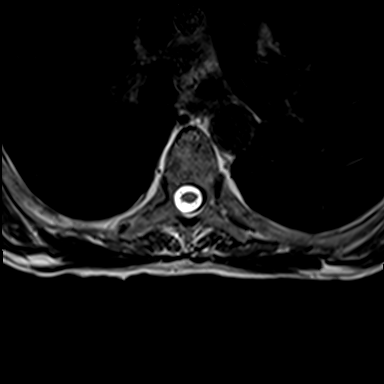
[im 33/39]
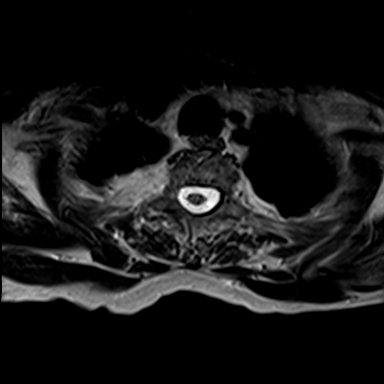

[Series 8: T2 · axial · 4.0mm · 0.52mm/px · z∈[-255,-80]mm · 3 of 39 slices shown (3 of 3)]
[im 6/39]
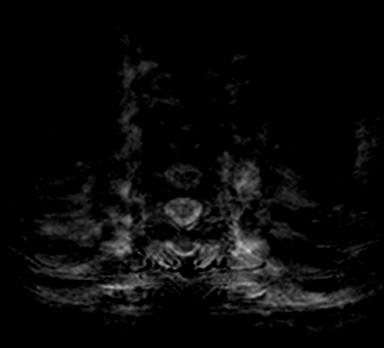
[im 20/39]
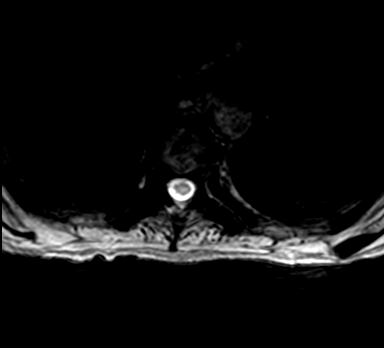
[im 33/39]
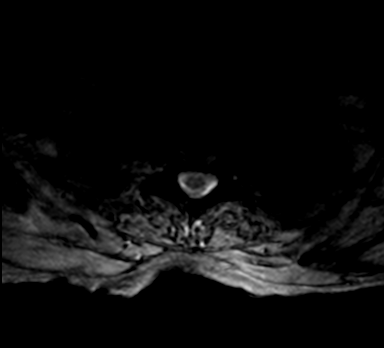

[14 of 48 positions shown; findings below may reference images not displayed]

FINDINGS: Alignment:  Mild kyphosis is seen about the T7 level.

Vertebrae: The patient has a severe compression fracture of T7 with
vertebra plana deformity identified. There is little to no marrow
edema within the vertebral body but the fracture is new since the
10/25/2016 examination consistent with acute or subacute injury.
Absence of marrow edema is likely due to complete compression of the
vertebral body. Also seen is edema between the T6-7 and T7-8 spinous
processes consistent with ligamentous injury. Edema is more intense
at T7-8. No other fracture is identified. No worrisome lesion is
seen.

Cord:  Normal signal throughout.

Paraspinal and other soft tissues: Hiatal hernia is noted. Right
lower lobe bronchiectasis and mucoid impaction are seen as on prior
chest CT.

Disc levels:

No disc bulge or protrusion is identified. Bony retropulsion off the
posterior margin of T7 appears to efface the ventral thecal sac
although the central canal and foramina are open.
IMPRESSION: Findings consistent with an acute or subacute compression fracture
of T7 with vertebral plana deformity. Soft tissue edema between the
T6-7 and T7-8 spinous processes is consistent with ligamentous
injury. Retropulsed bone off the inferior endplate of T7 effaces the
ventral thecal sac but the central canal and foramina are open.

Hiatal hernia.

## 2017-08-17 IMAGING — DX DG CHEST 2V
2 series · 2 of 2 positions shown · non-contrast
Comparison: Chest x-ray of [REDACTED] 2416

CLINICAL DATA: Weakness and fatigue and cough for the past 10 days.
History of COPD, bronchiectasis, previous episodes of pneumonia,
Jim granulomatosis.

EXAM:
CHEST  2 VIEW

[chest lat]
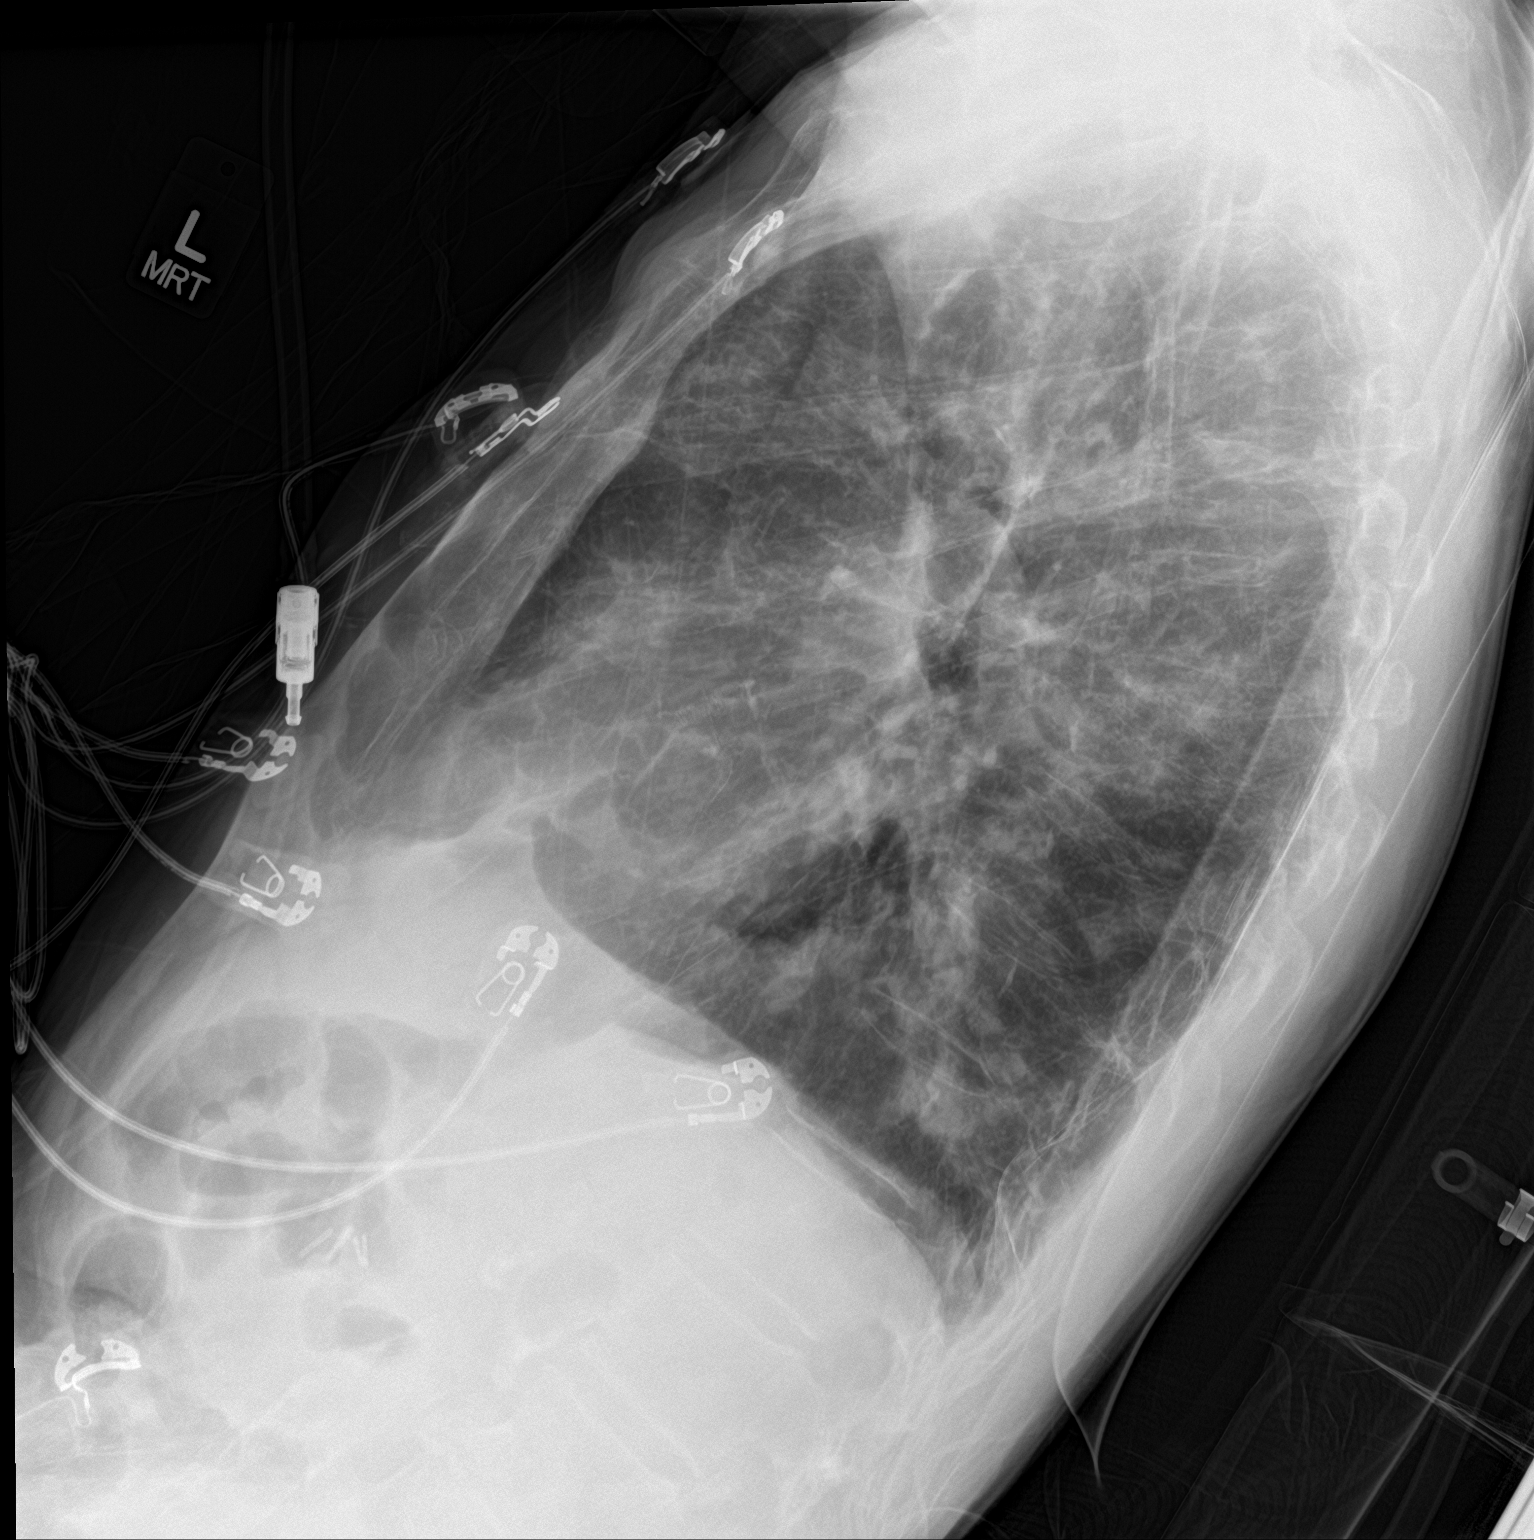

[chest ap]
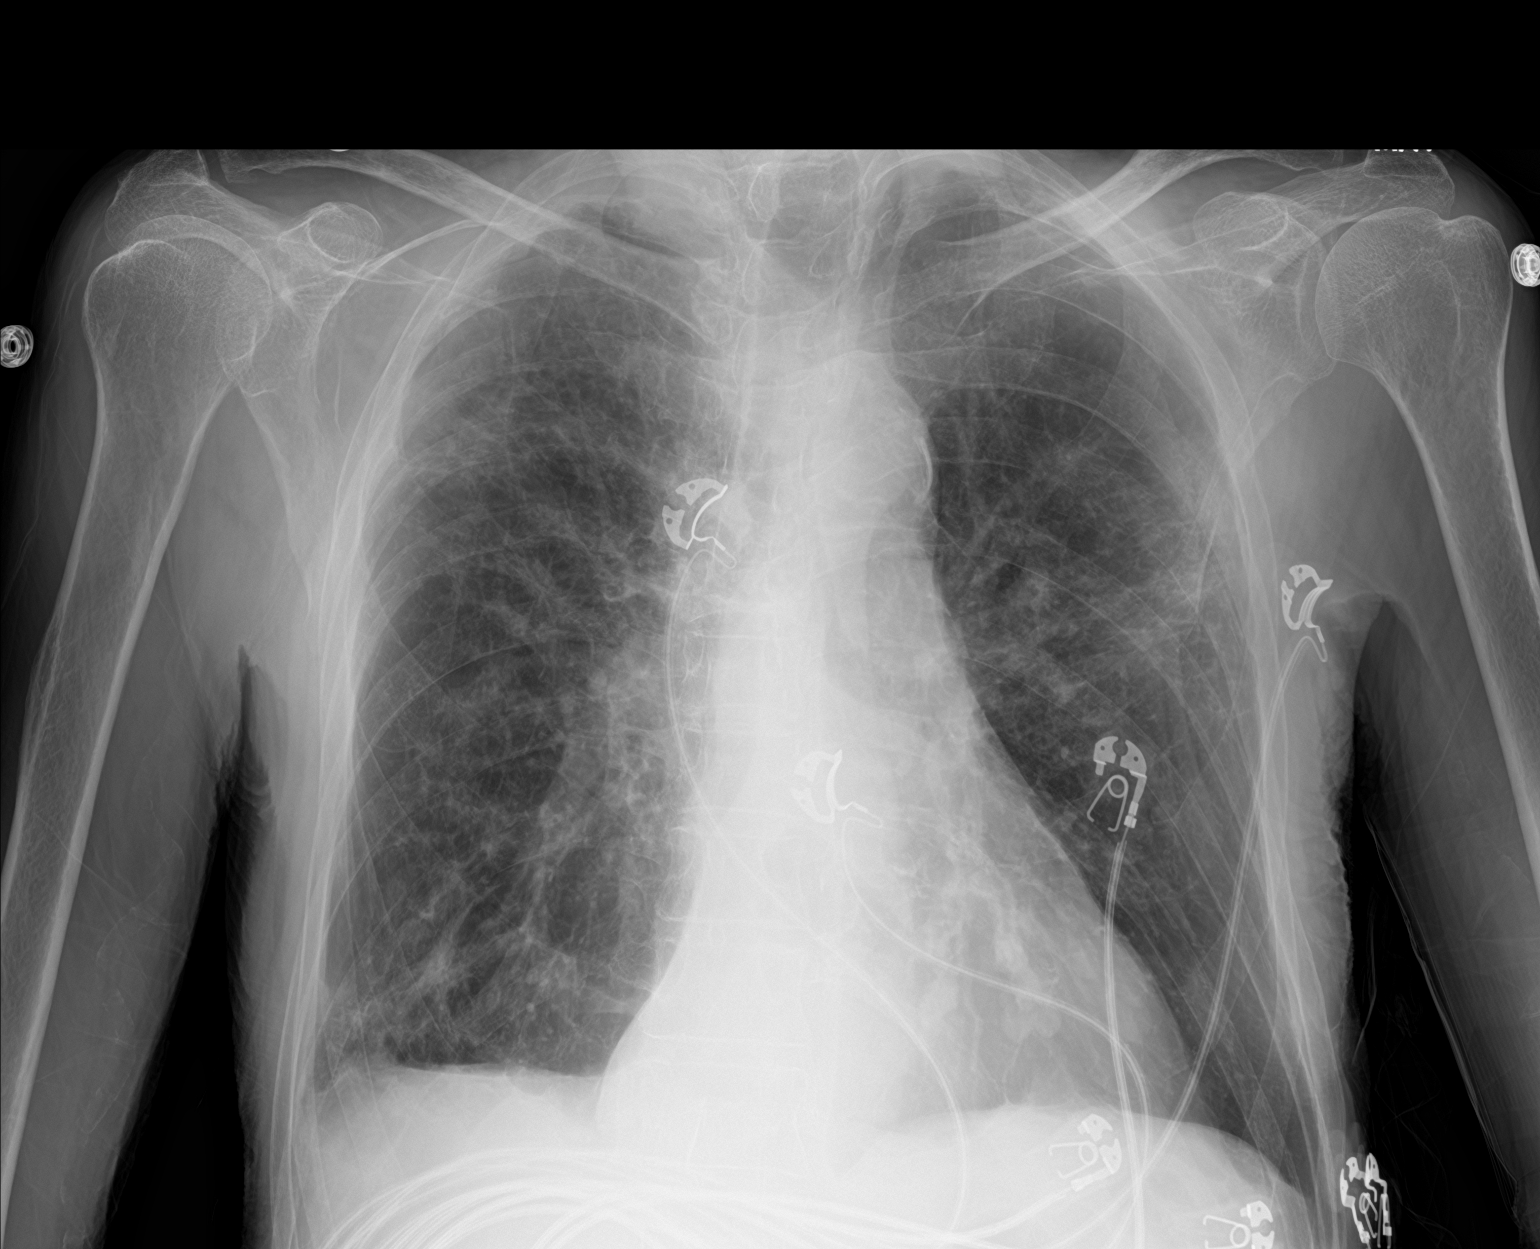

[2 of 2 positions shown; findings below may reference images not displayed]

FINDINGS: The lungs remain hyperinflated. The interstitial markings are
increased bilaterally and on the left are more conspicuous in the
mid lung field. There is no alveolar infiltrate. The heart and
pulmonary vascularity are normal. There is calcification in the wall
of the aortic arch. There is a hiatal hernia. The bony thorax
exhibits no acute abnormality.
IMPRESSION: Chronic bronchitic change and it interstitial change. Superimposed
new interstitial infiltrate in the left mid lung which may reflect
mild interstitial pneumonia or active inflammatory disease.

## 2017-08-19 IMAGING — CR DG CHEST 1V PORT
1 series · 1 of 1 positions shown · non-contrast
Comparison: 01/05/2017 and multiple previous

CLINICAL DATA: Shortness of breath with exertion.

EXAM:
PORTABLE CHEST 1 VIEW

[AP]
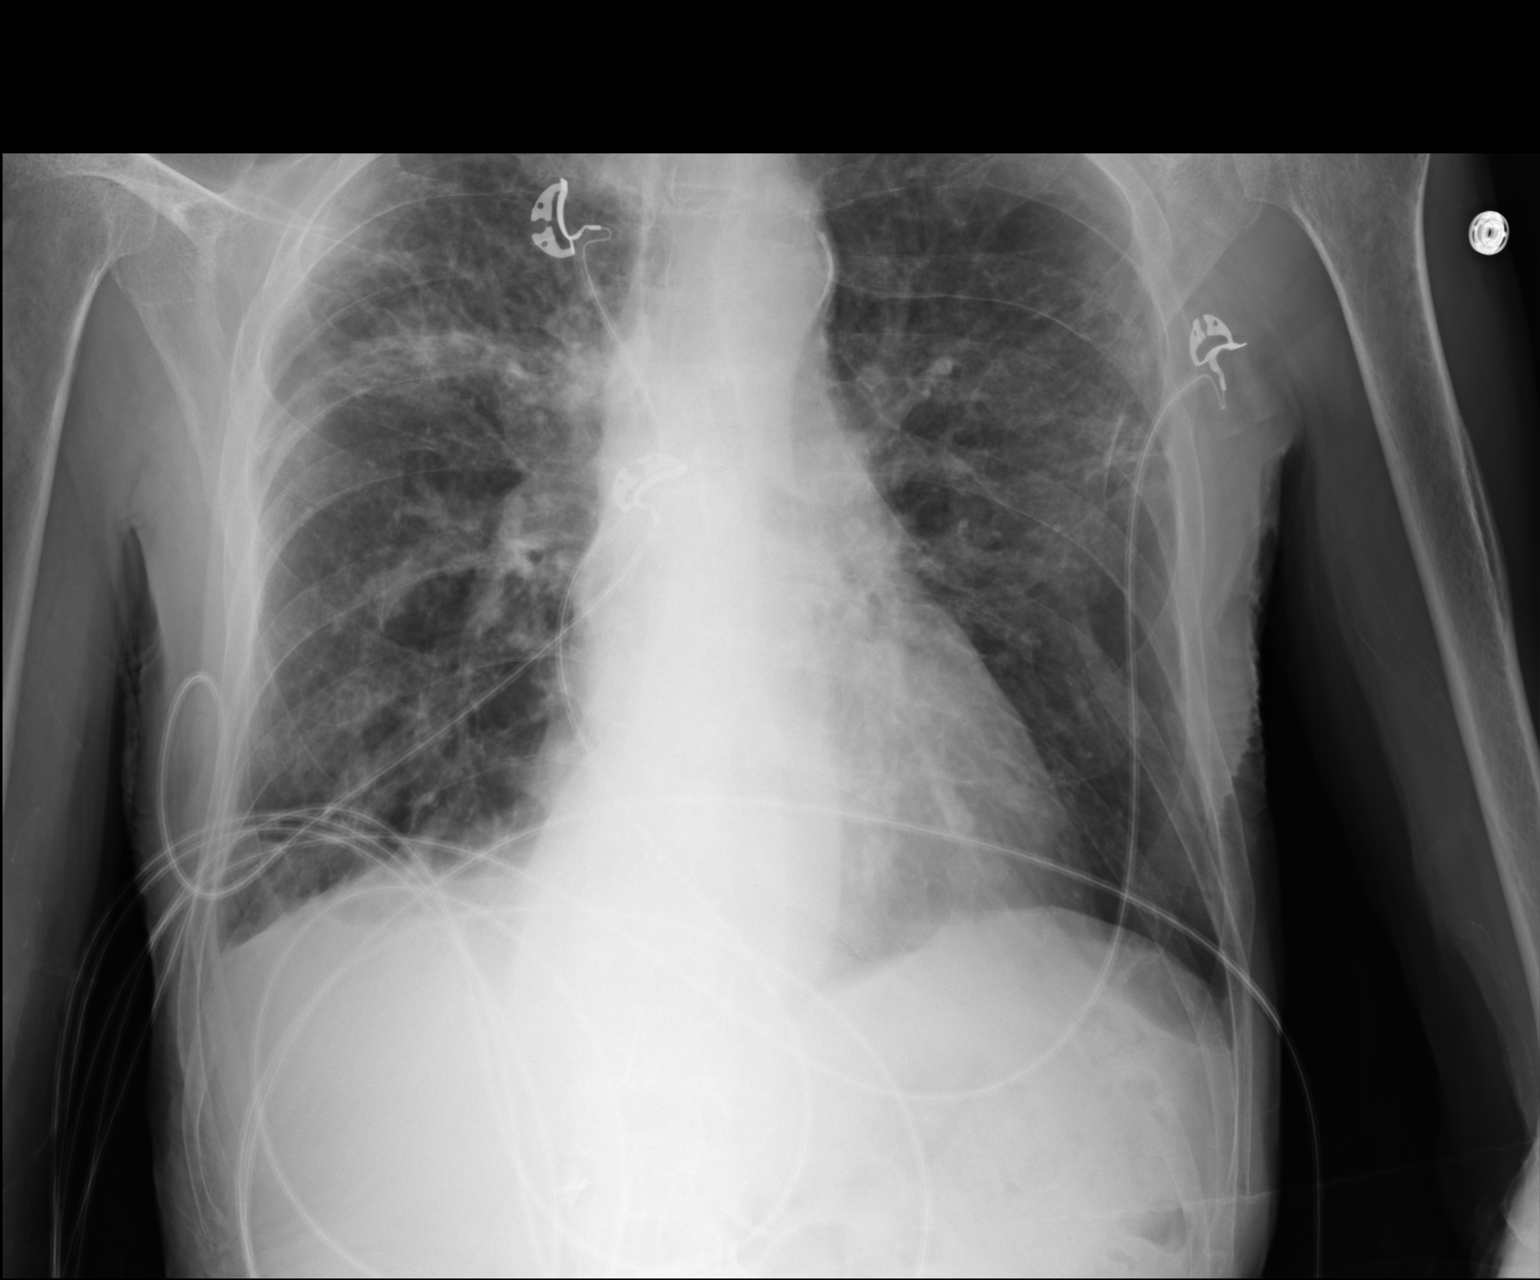

[1 of 1 positions shown; findings below may reference images not displayed]

FINDINGS: Heart size is normal. Chronic aortic atherosclerosis. Chronic lung
disease with abnormal interstitial markings bilaterally. More focal
density in the right suprahilar region and upper lobe suggesting
bronchopneumonia. Follow-up to clearing recommended.
IMPRESSION: Chronic lung disease as seen previously.

Focal density in the right suprahilar region and right upper lobe
consistent with pneumonia. Follow-up to clearing.

## 2017-09-08 IMAGING — CR DG CHEST 2V
2 series · 2 of 2 positions shown · non-contrast
Comparison: Multiple prior radiographs, most recent 01/07/2017.

CLINICAL DATA: Shortness of breath. Recent diagnosis of pneumonia.
Cough for 10 days. COPD and bronchitis.

EXAM:
CHEST  2 VIEW

[w chest pa]
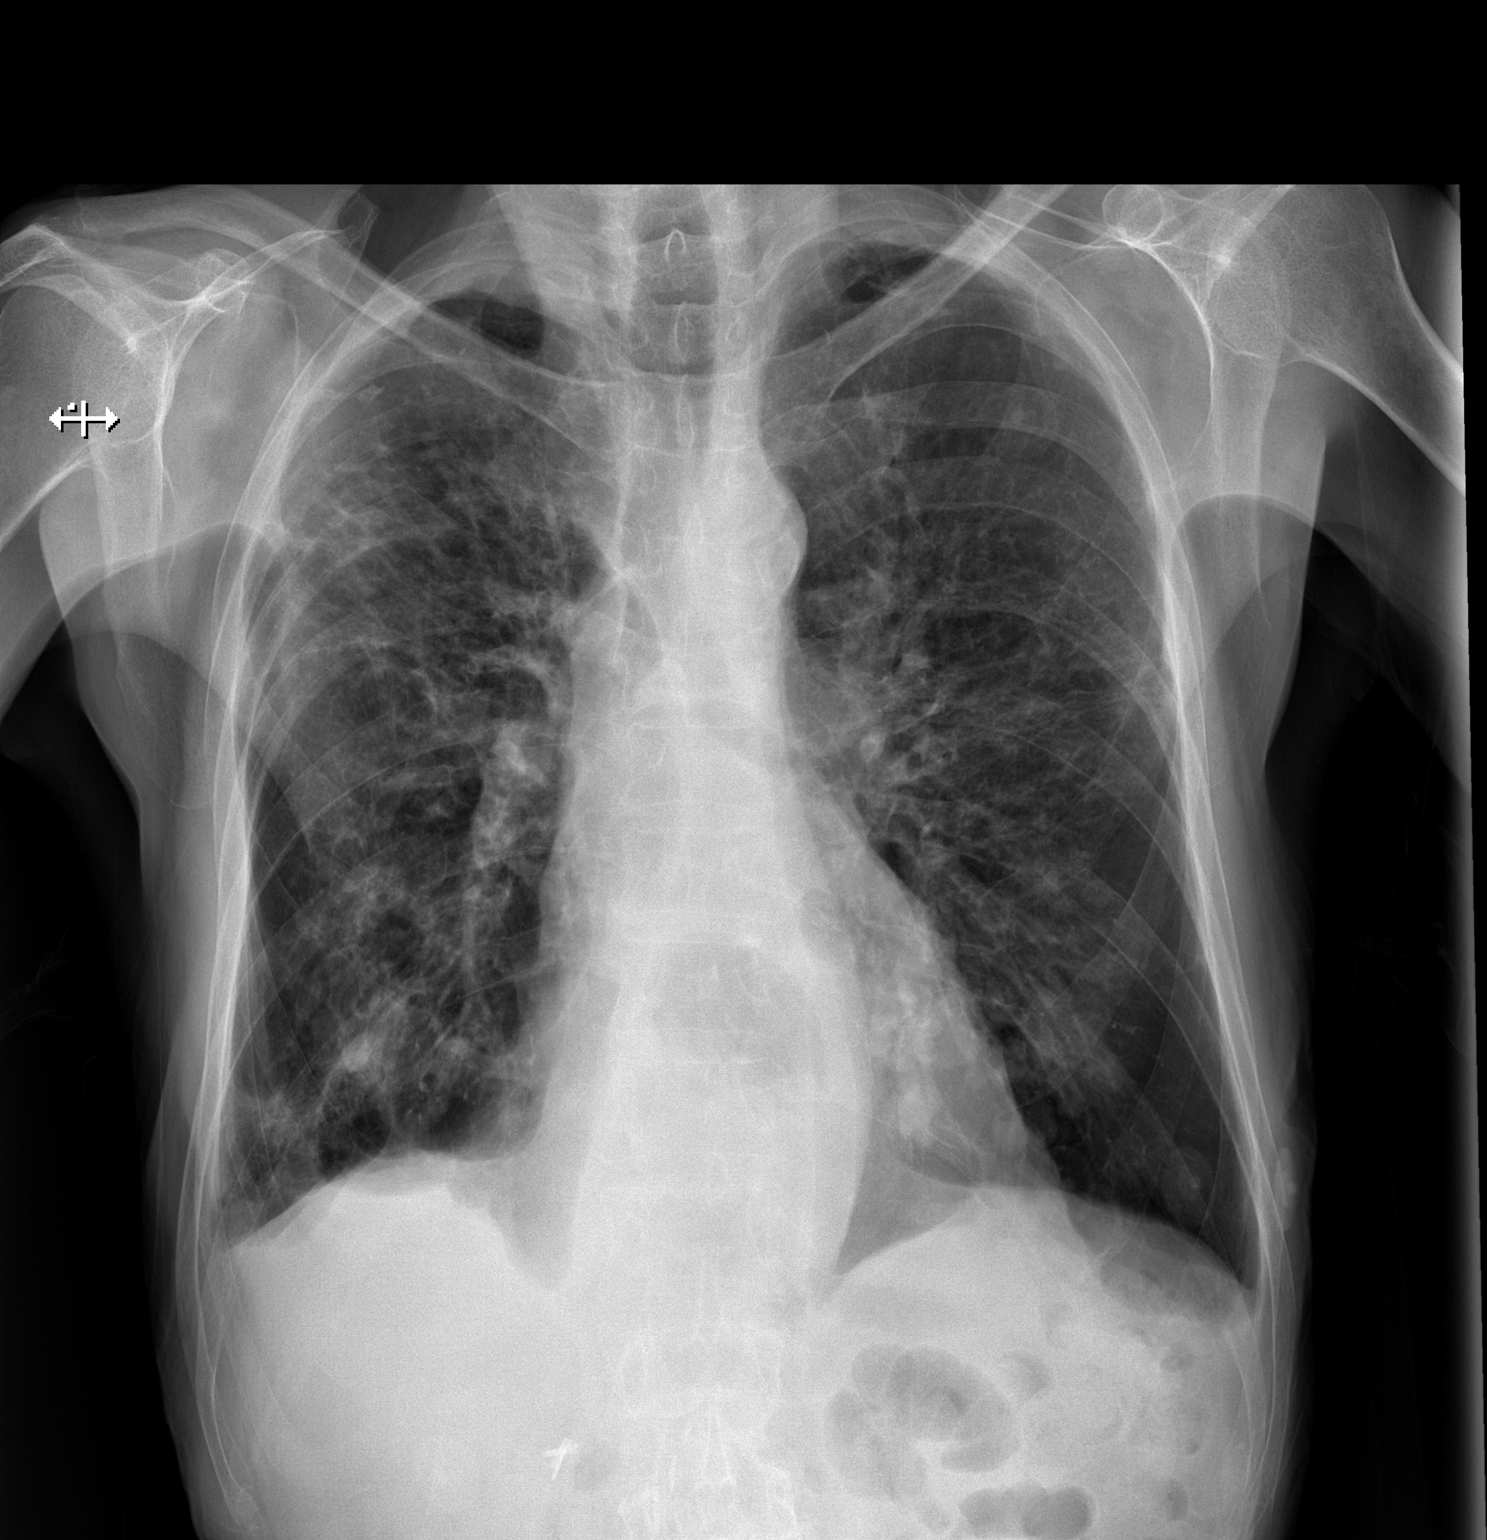

[w chest lat]
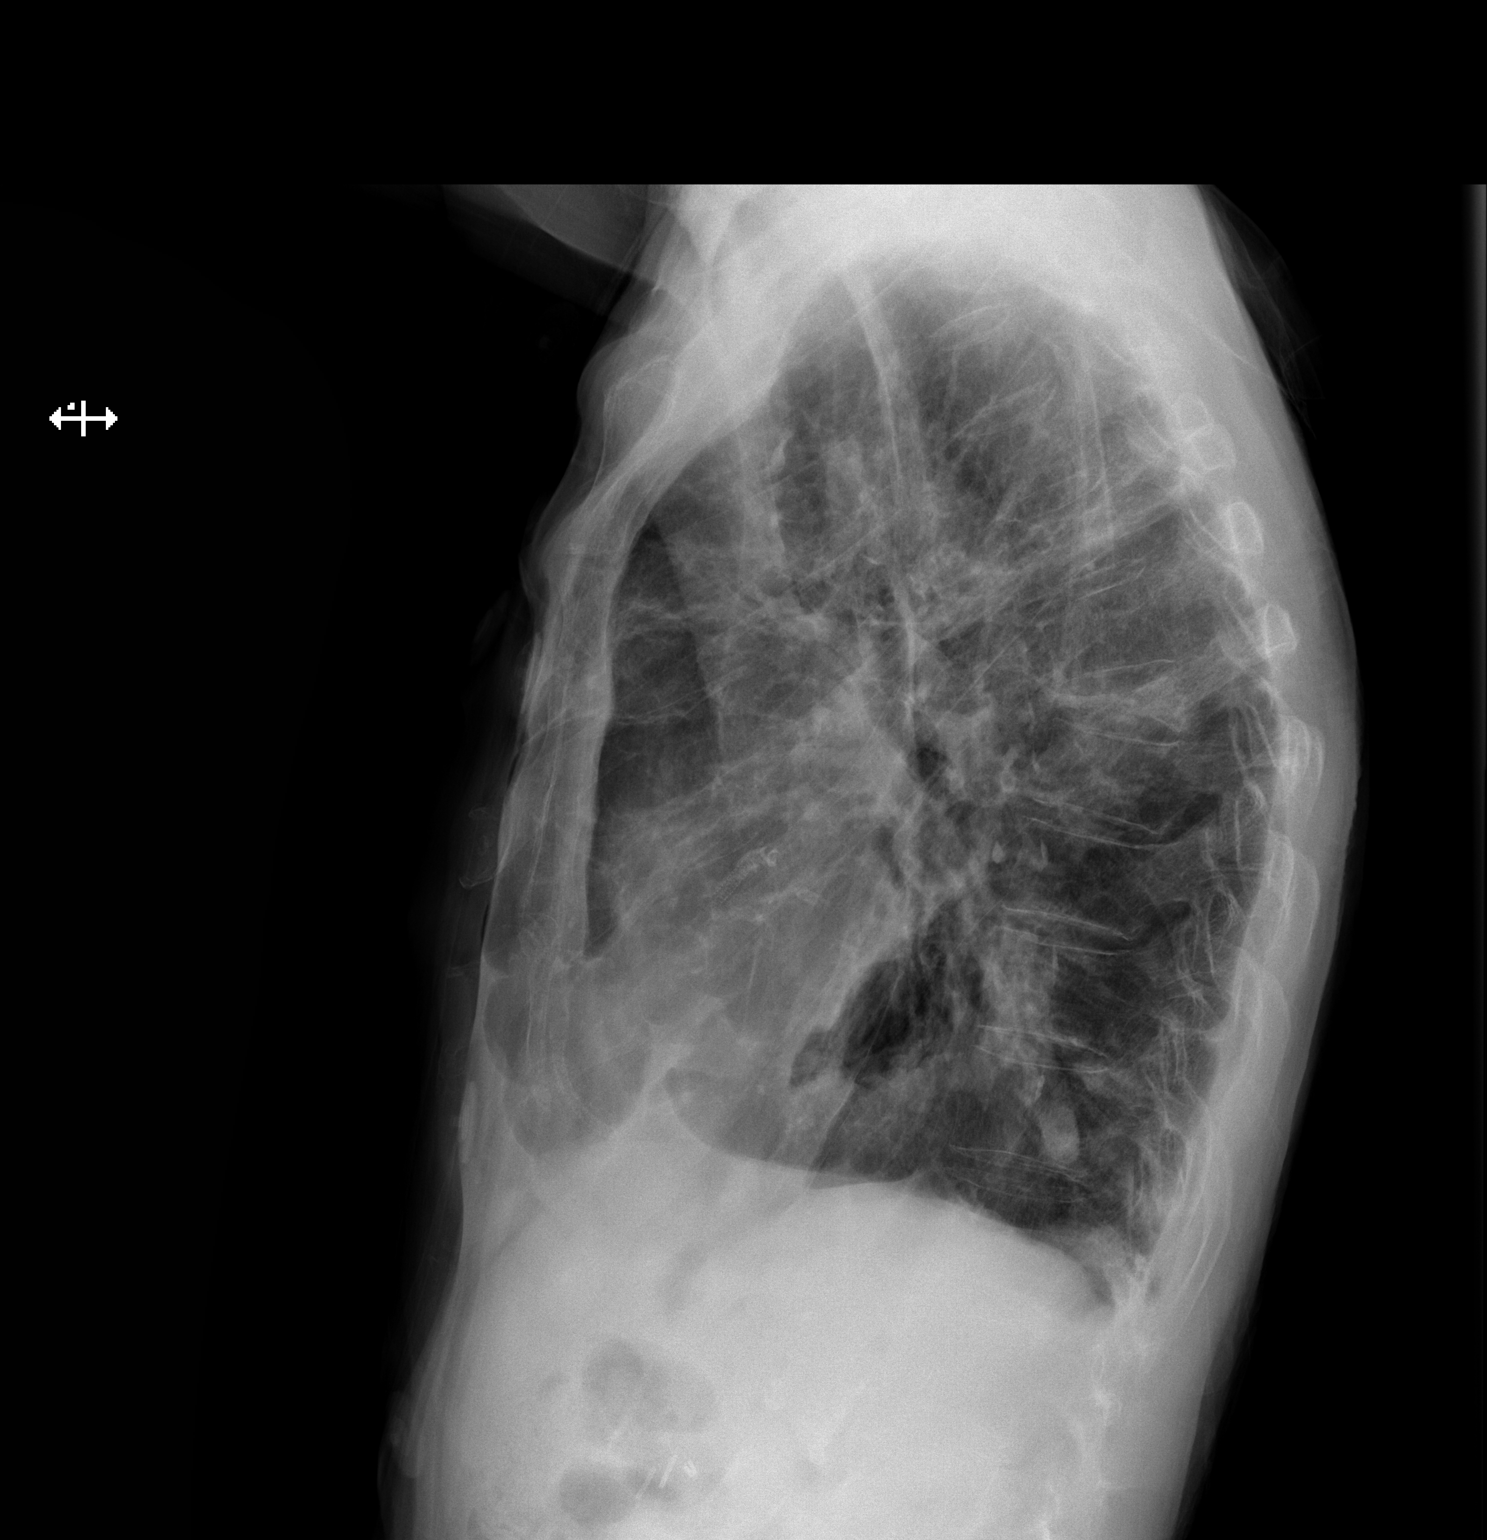

[2 of 2 positions shown; findings below may reference images not displayed]

FINDINGS: Apparent mid thoracic vertebral body height loss may be artifactual
; not identified on prior radiographs for CTs. Midline trachea.
Normal heart size. Atherosclerosis in the transverse aorta. A
moderate hiatal hernia. No pleural effusion or pneumothorax.
Appearance of moderate-to-marked relatively diffuse interstitial
thickening is grossly similar to the baseline radiograph of
08/21/2016. The right upper lobe consolidation described on the
prior plain film has resolved. Probable right-sided nipple shadow.
IMPRESSION: Resolution of right upper lobe airspace disease.

Chronic moderate-to-marked pulmonary interstitial thickening is
similar. When correlated with the 07/30/2016 CT, this is secondary
to lower lobe predominant bronchiectasis and mucous plugging. This
is likely related to chronic atypical infection or (especially given
hiatal hernia) aspiration.

Probable right-sided nipple shadow. Repeat frontal radiograph with
nipple markers could confirm.

Aortic Atherosclerosis ([id number]).

## 2017-09-29 IMAGING — CR DG CHEST 2V
2 series · 2 of 2 positions shown · non-contrast
Comparison: Radiographs January 27, 2017.

CLINICAL DATA: Shortness of breath.  Cough.

EXAM:
CHEST  2 VIEW

[w chest lat]
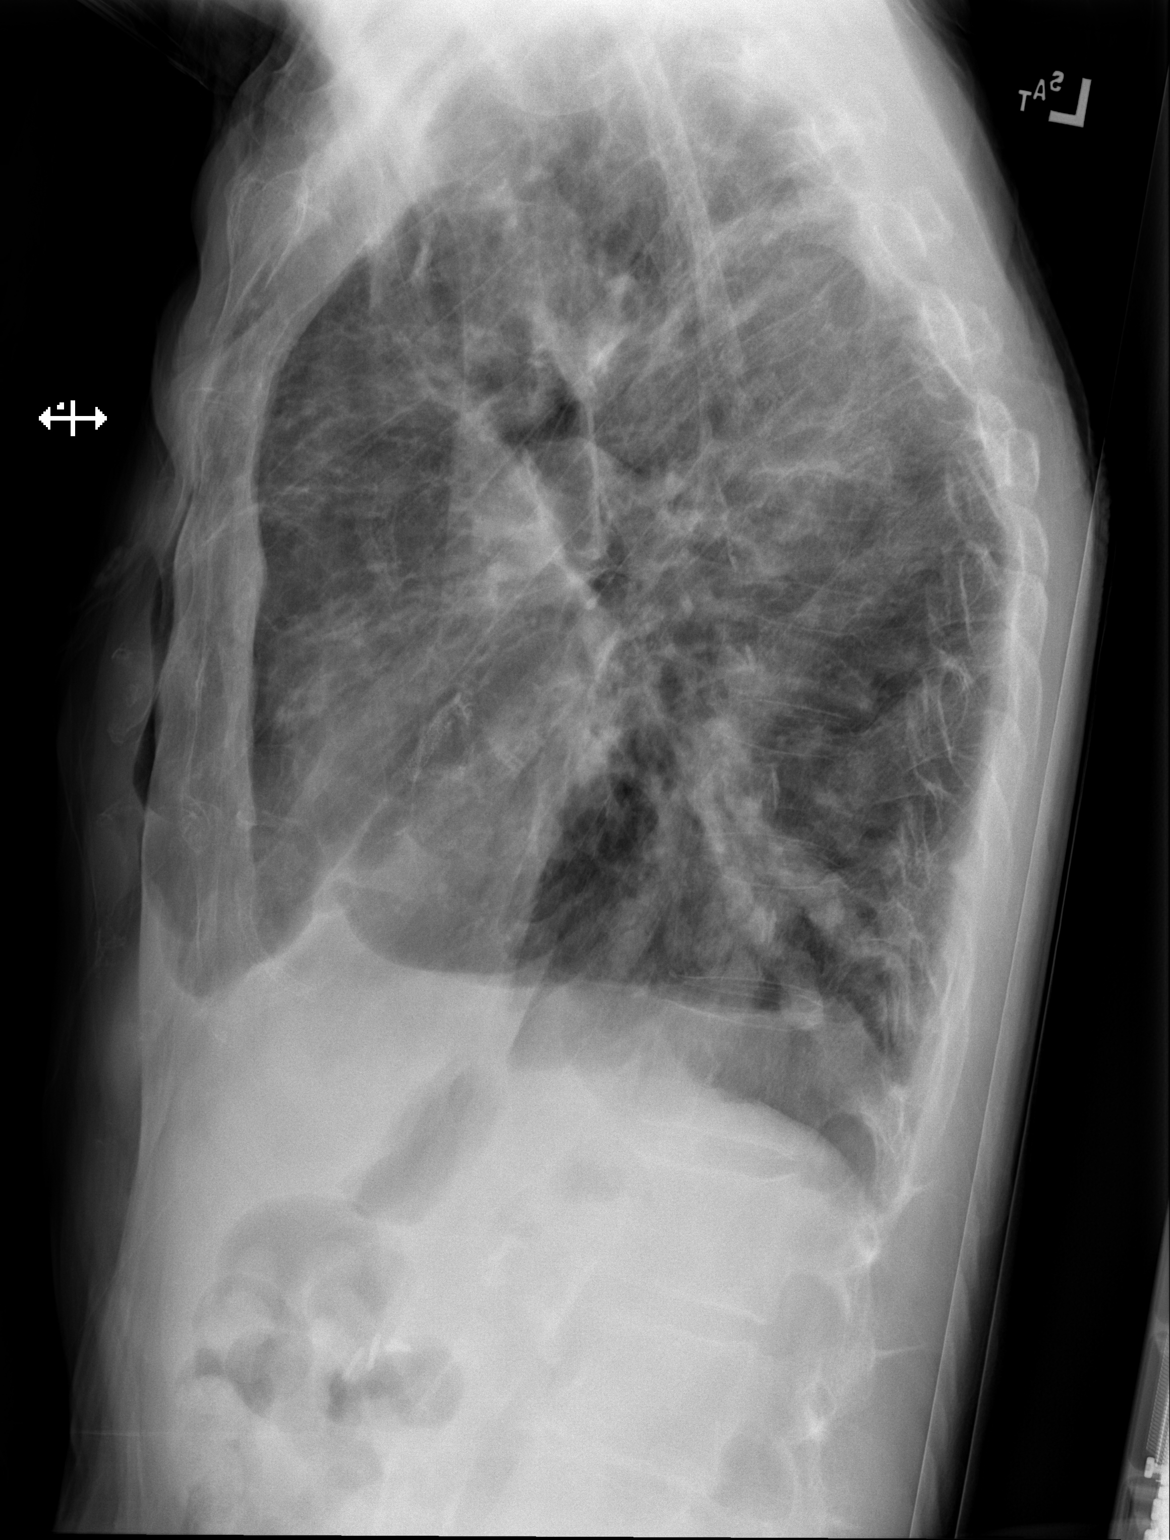

[x chest ap]
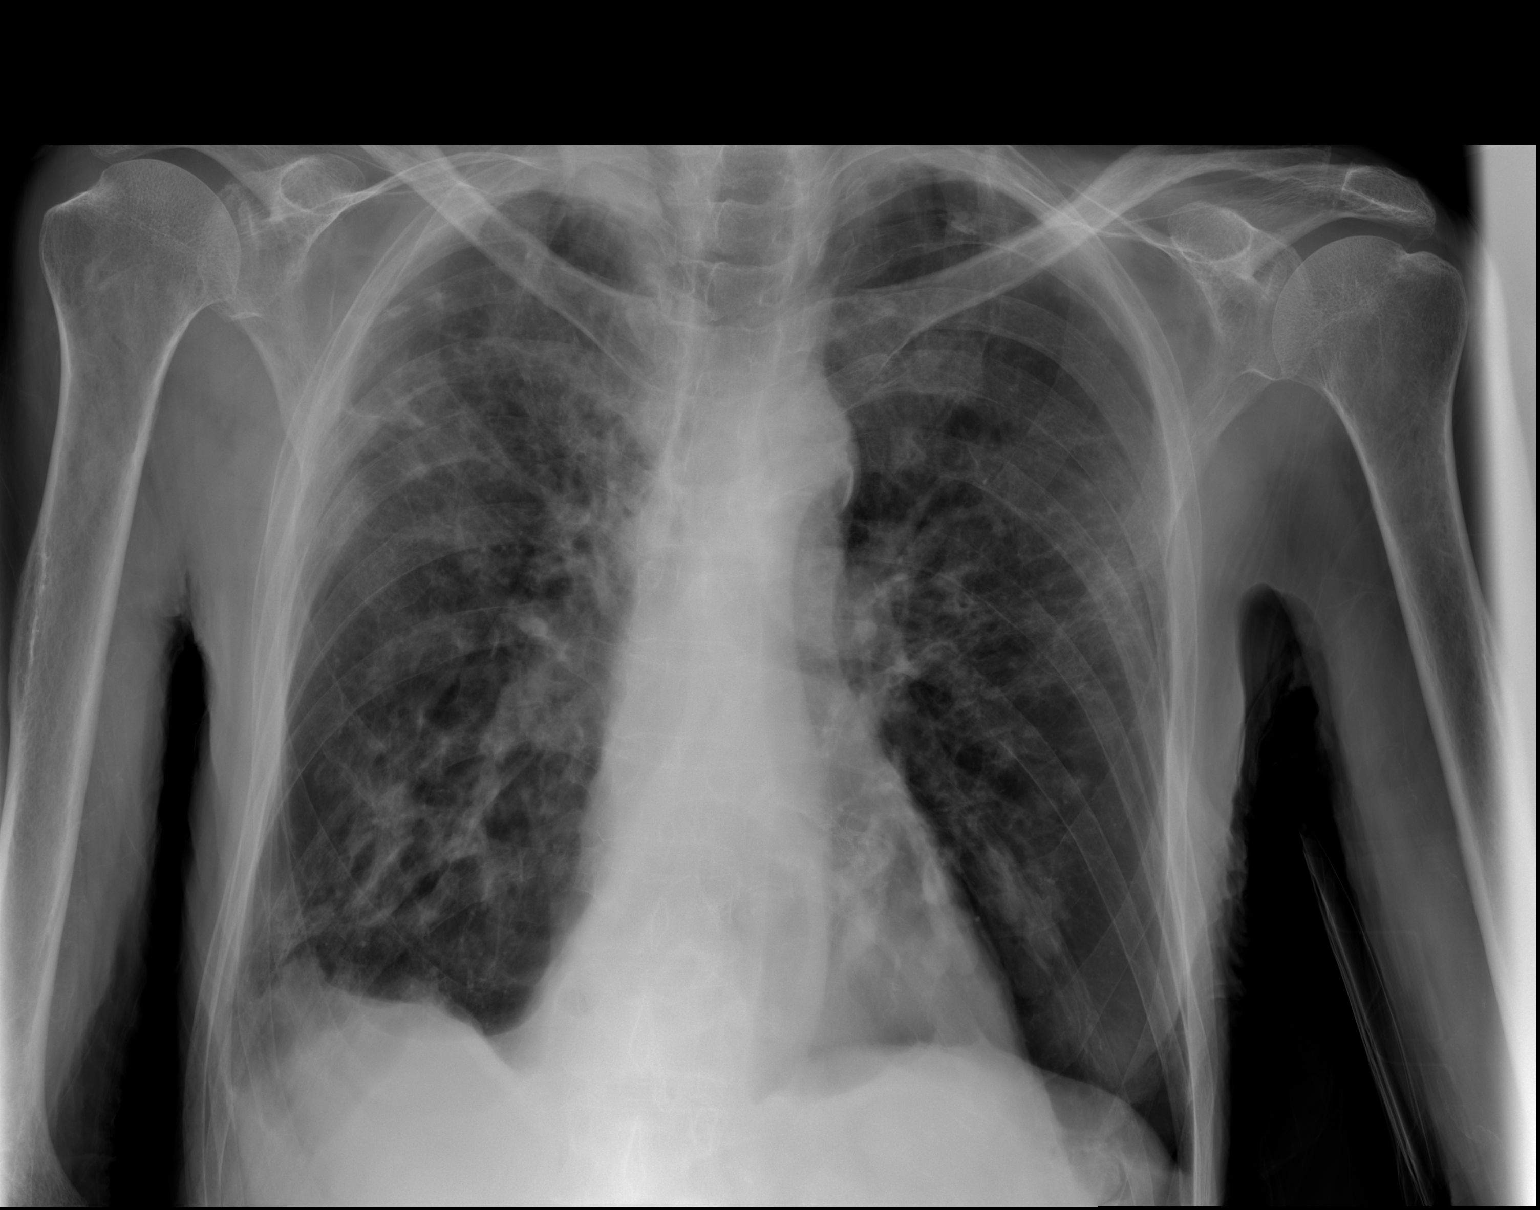

[2 of 2 positions shown; findings below may reference images not displayed]

FINDINGS: The heart size and mediastinal contours are within normal limits.
Atherosclerosis of thoracic aorta is noted. Stable interstitial
densities are noted throughout both lungs most consistent with
scarring or fibrosis ; this is most prominently seen in the right
lung base. No pneumothorax or pleural effusion is noted. Stable old
compression fracture of midthoracic vertebral body.
IMPRESSION: Aortic atherosclerosis. Stable bilateral lung opacities are noted
concerning for scarring or fibrosis. No new opacities are noted.

## 2017-10-02 IMAGING — DX DG CHEST 2V
2 series · 2 of 2 positions shown · non-contrast
Comparison: 02/17/2017 and earlier, including thoracic spine MRI
12/30/2016 and chest CT 07/30/2016.

CLINICAL DATA: 80 y/o male with cough. [place] granulomatosis with
renal involvement.

EXAM:
CHEST  2 VIEW

[chest pa]
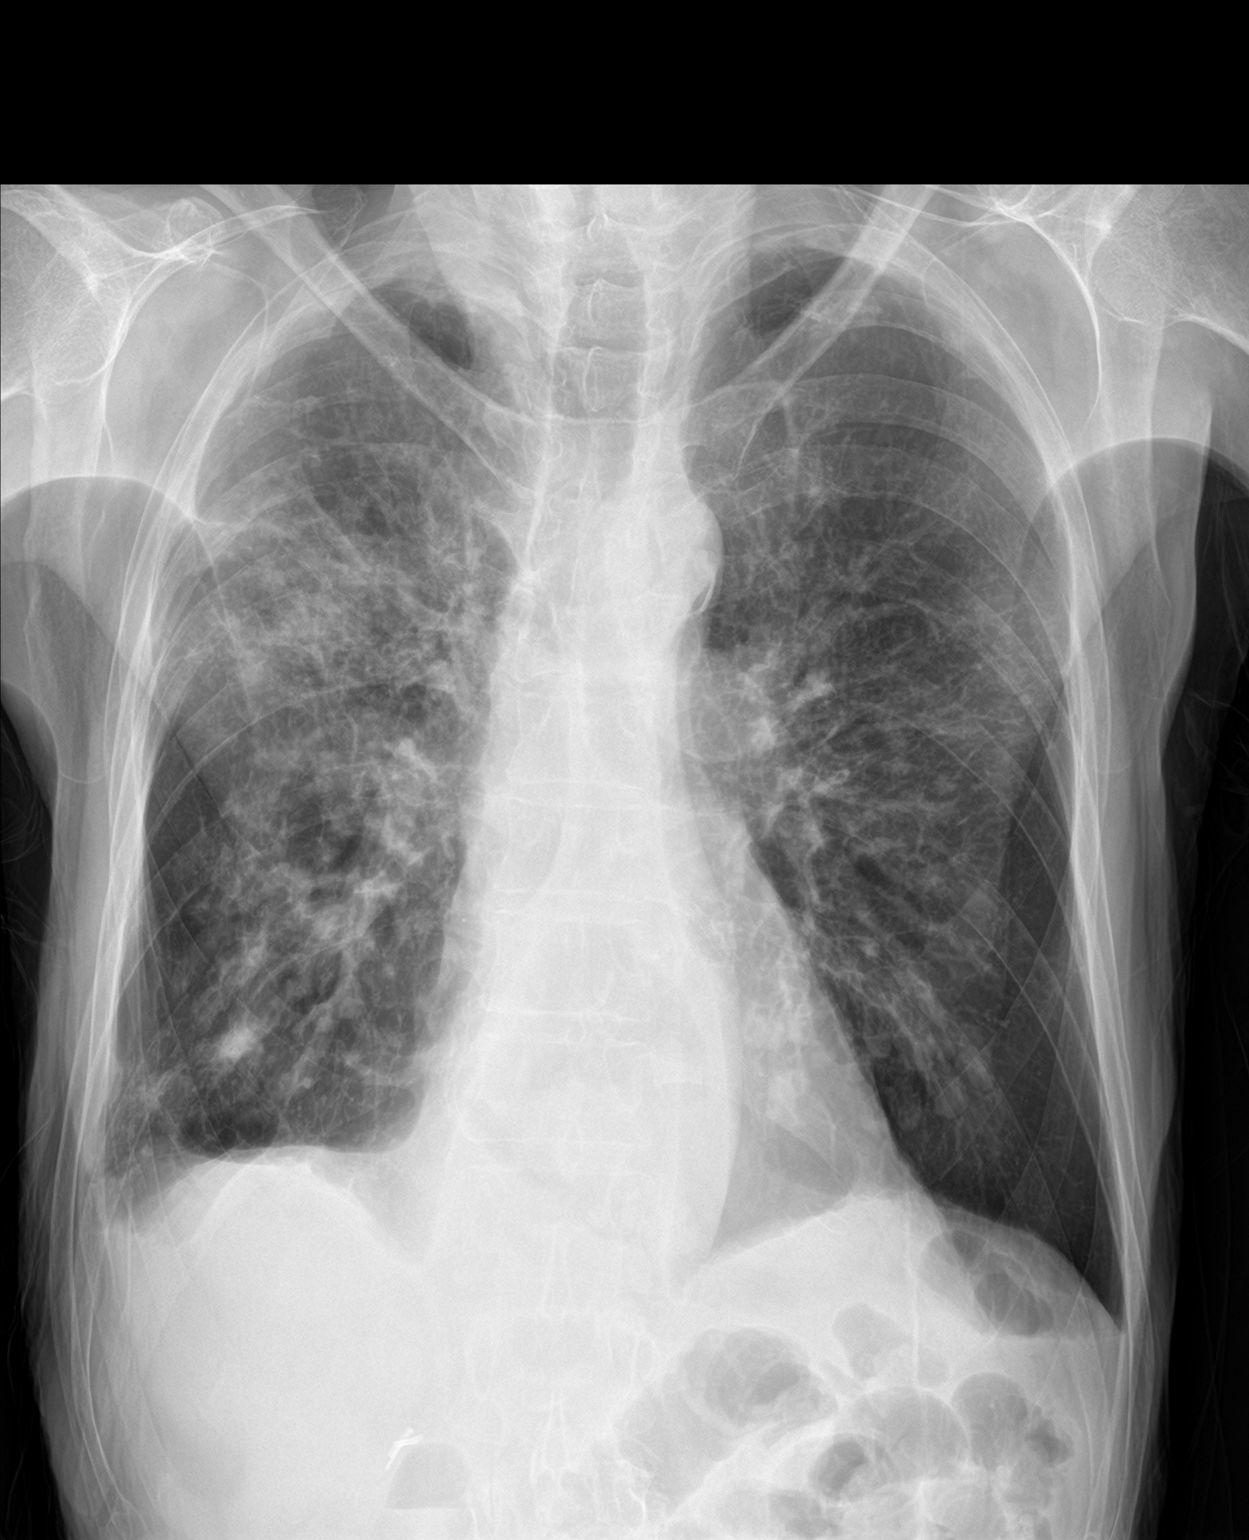

[chest lat]
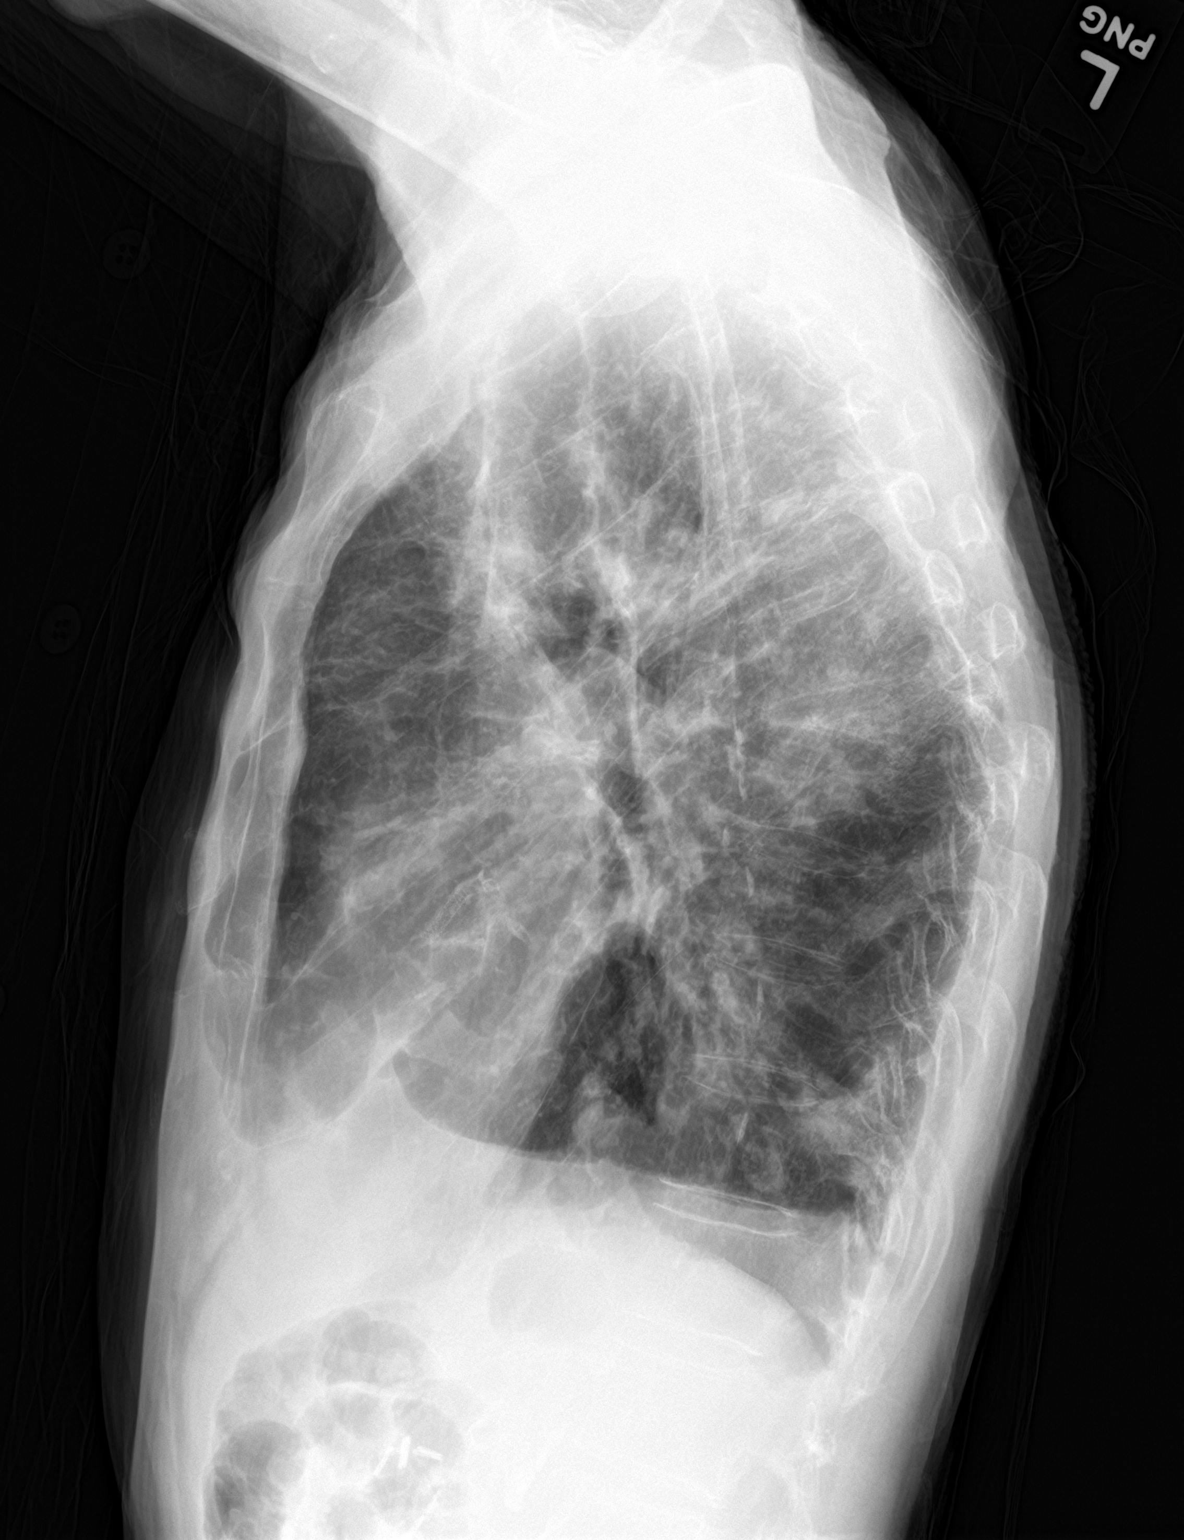

[2 of 2 positions shown; findings below may reference images not displayed]

FINDINGS: Chronic bronchiectasis with peribronchial thickening and large lung
volumes. No superimposed pneumothorax or pleural effusion. No
consolidation or acute pulmonary opacity identified. Stable cardiac
size and mediastinal contours. Calcified aortic atherosclerosis.
Chronic hiatal hernia. Visualized tracheal air column is within
normal limits. Stable cholecystectomy clips. Negative visible bowel
gas pattern.

T7 compression fracture again noted. Osteopenia. No acute osseous
abnormality identified.
IMPRESSION: 1. Chronic lung disease with bronchiectasis and peribronchial
opacity appears stable since 01/27/2017.
[DATE].  No superimposed acute findings are identified.

## 2017-10-03 IMAGING — CR DG CHEST 2V
2 series · 2 of 2 positions shown · non-contrast
Comparison: 02/20/2017.

CLINICAL DATA: Chest pain and shortness of breath for the past 3
weeks.

EXAM:
CHEST  2 VIEW

[chest pa]
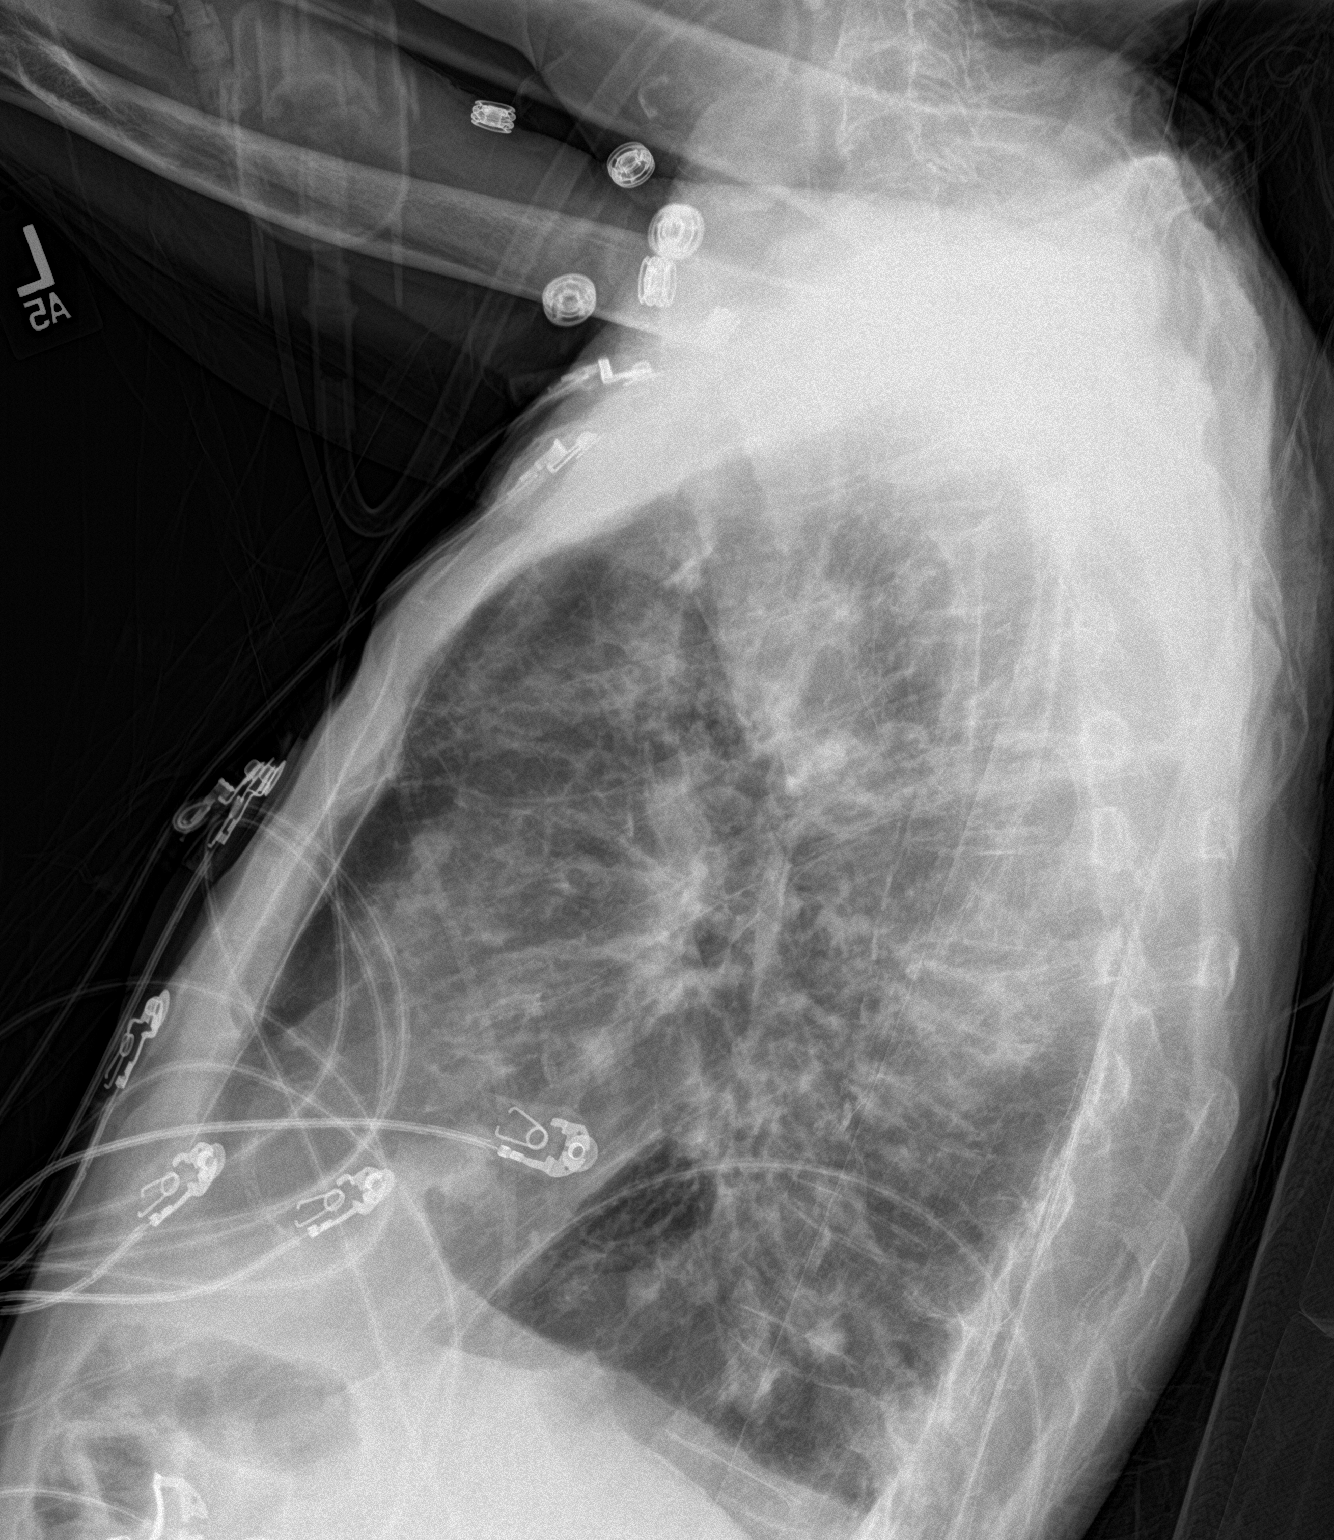

[chest ap]
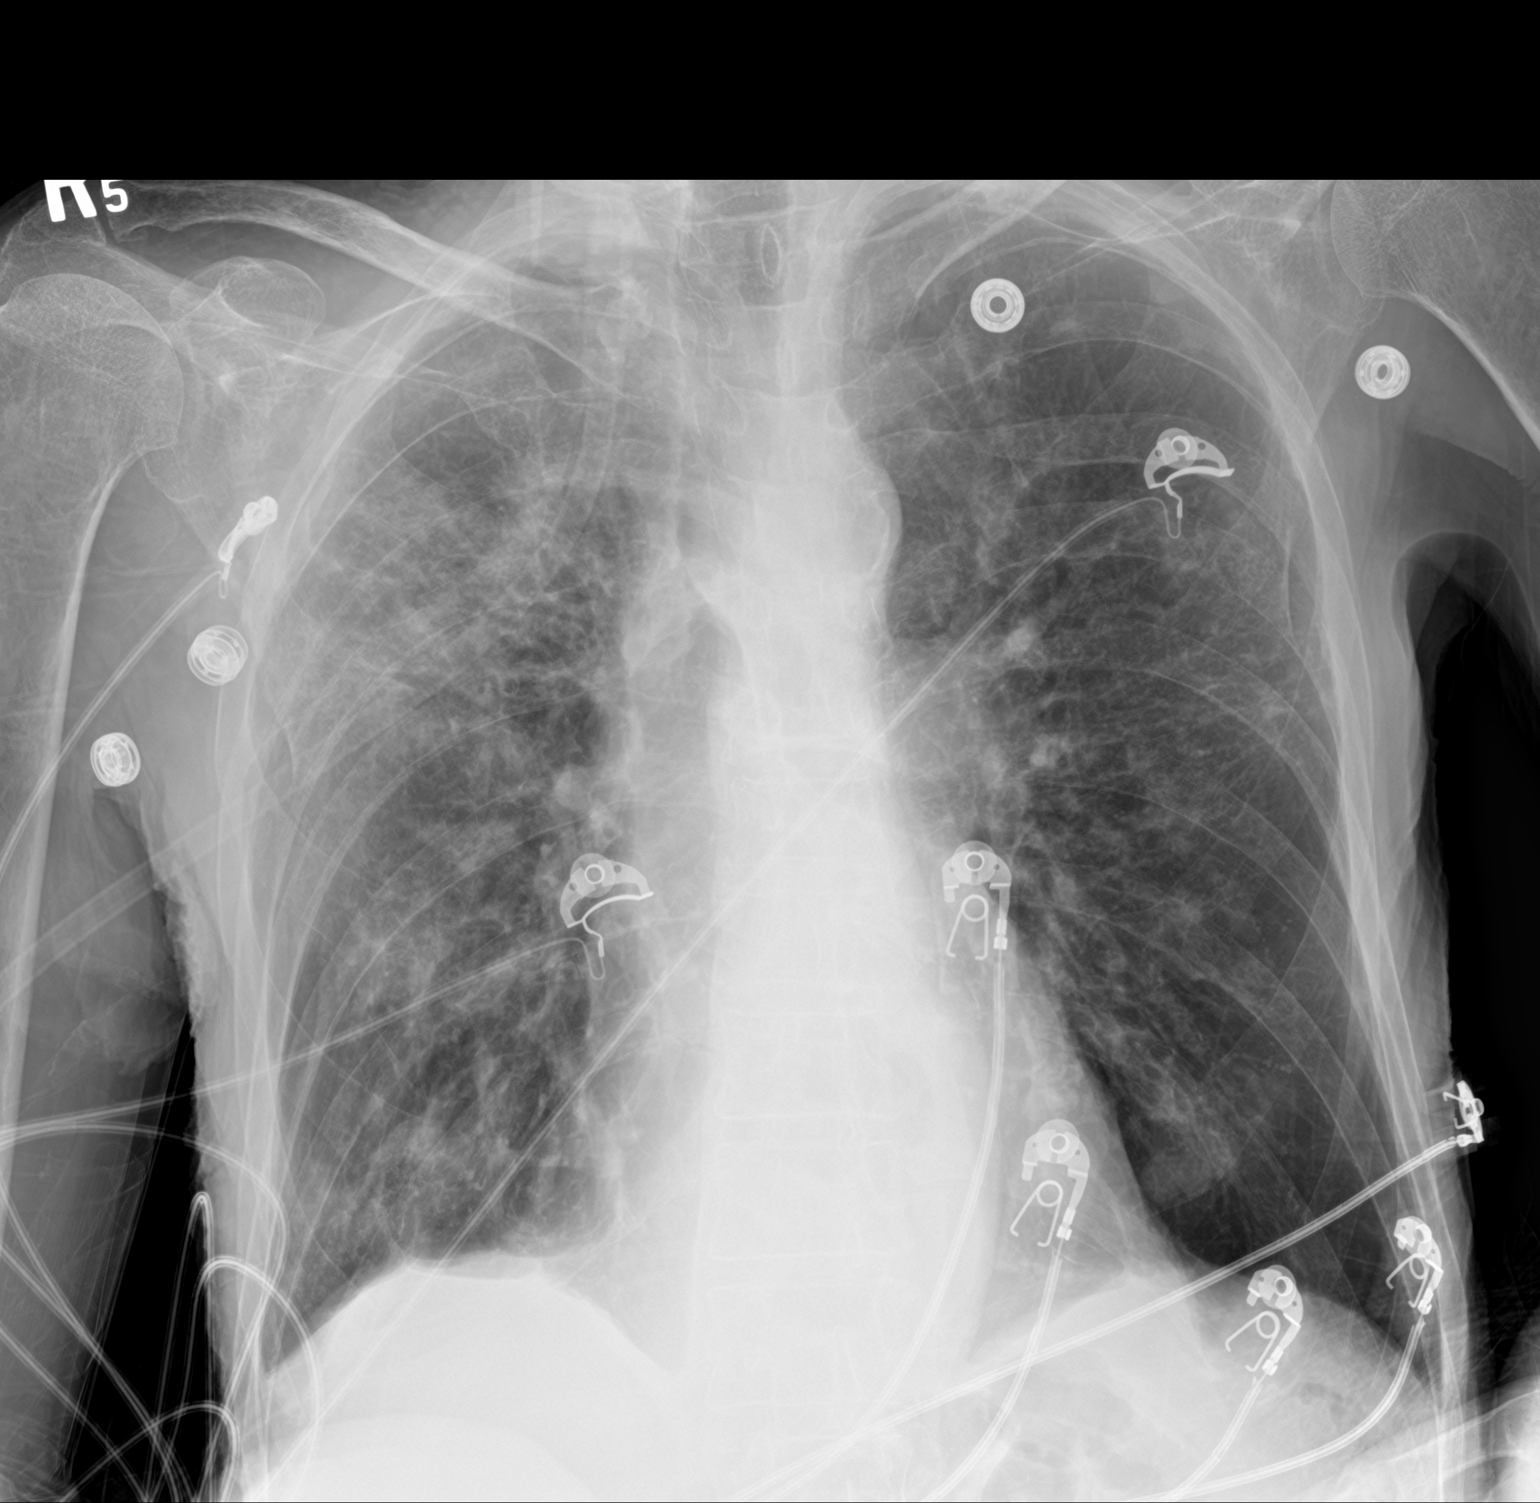

[2 of 2 positions shown; findings below may reference images not displayed]

FINDINGS: Normal sized heart. The lungs remain hyperexpanded. Patchy opacity
is again demonstrated throughout the majority of the right lung. The
left lung is clear. The lungs remain hyperexpanded. Stable
prominence of the interstitial markings in both lungs. Diffuse
osteopenia. Stable marked T7 vertebral compression deformity.
IMPRESSION: Stable changes of COPD with interstitial fibrosis. No acute
abnormality.

## 2017-10-08 IMAGING — RF DG SWALLOWING FUNCTION - NRPT [facility]
1 series · 18 of 24 positions shown · non-contrast
Comparison: none

[Series 1: run · 17 acquisitions, 18 frames shown]
[im 1/17]
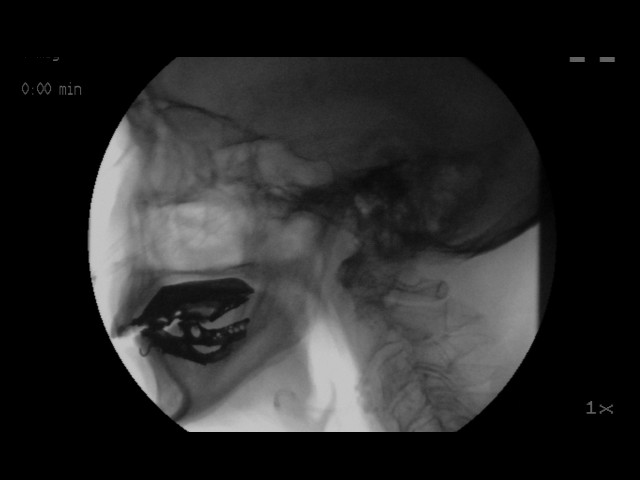
[im 2/17]
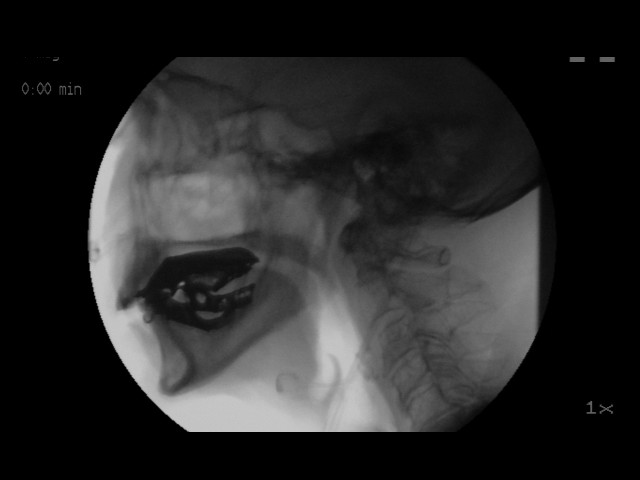
[im 3/17]
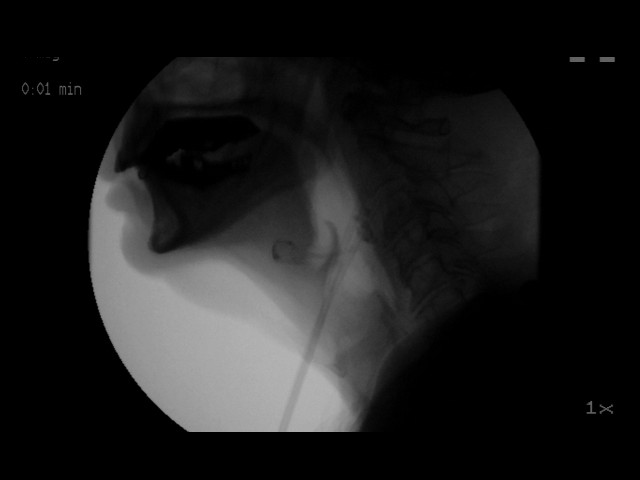
[im 4/17]
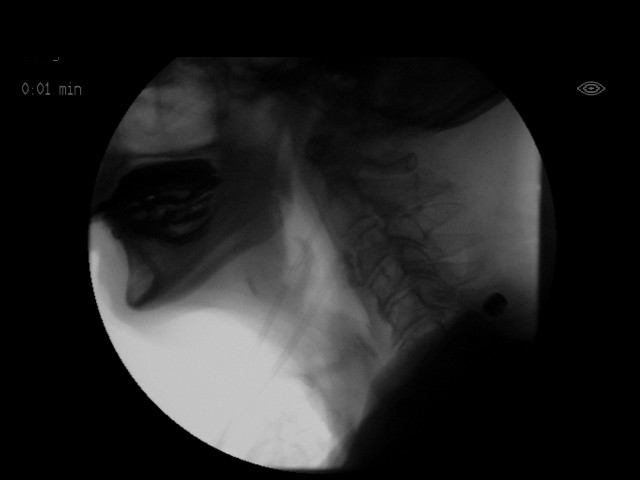
[im 5/17]
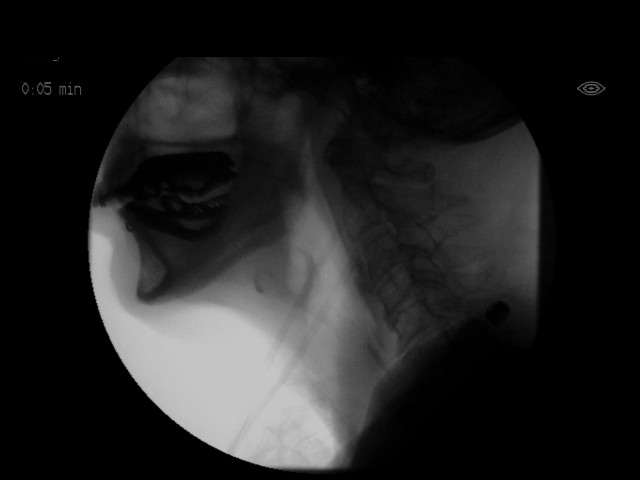
[im 6/17]
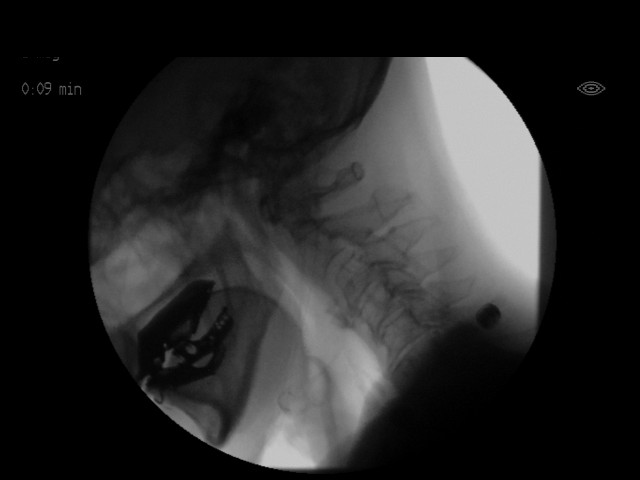
[im 6/17]
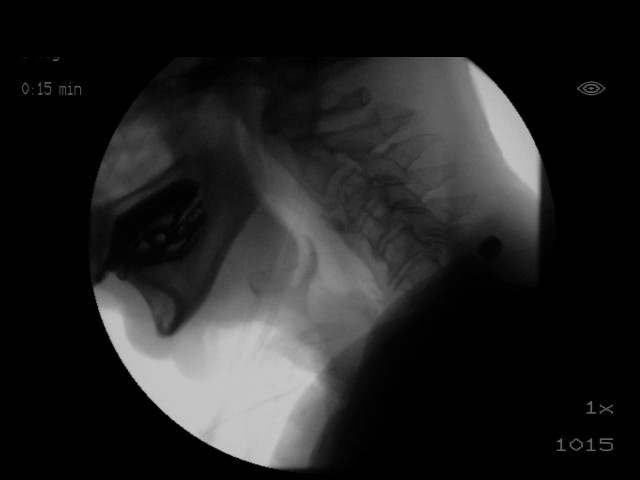
[im 8/17]
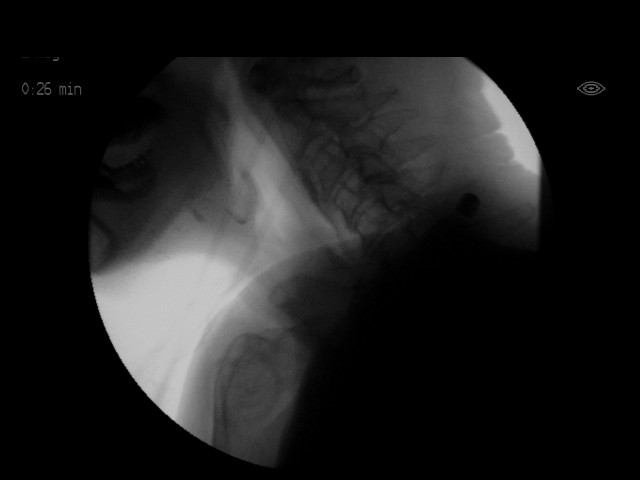
[im 9/17]
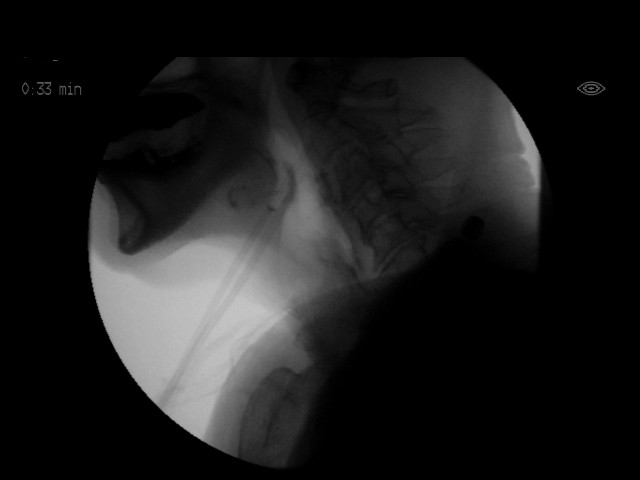
[im 9/17]
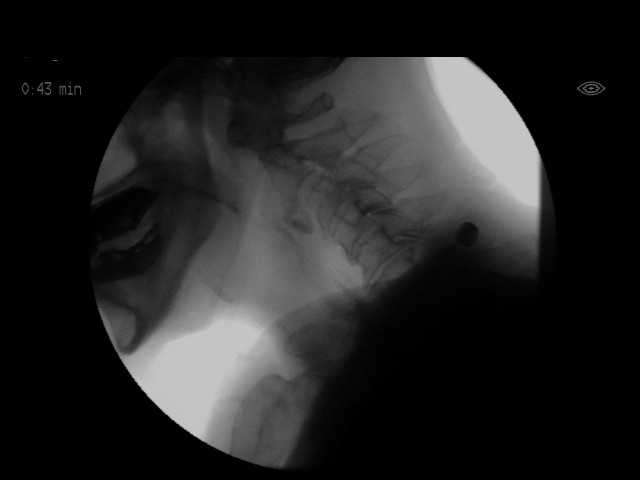
[im 11/17]
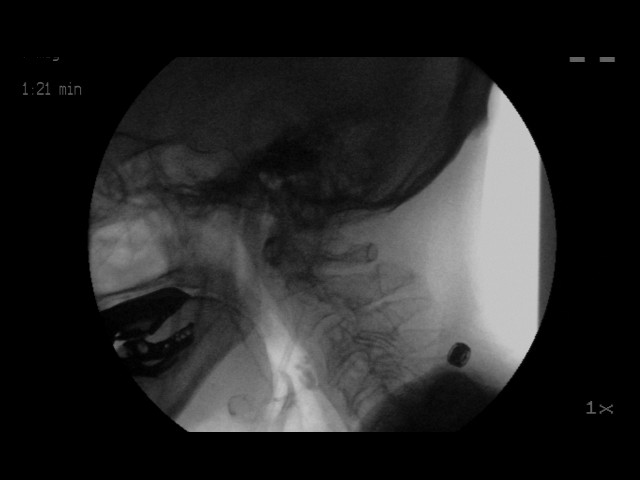
[im 12/17]
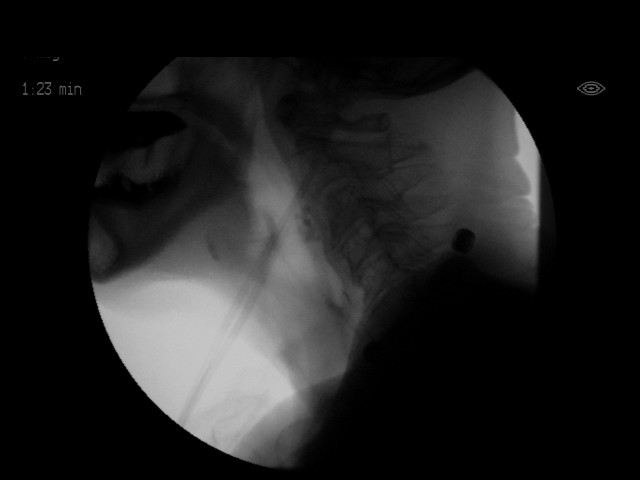
[im 12/17]
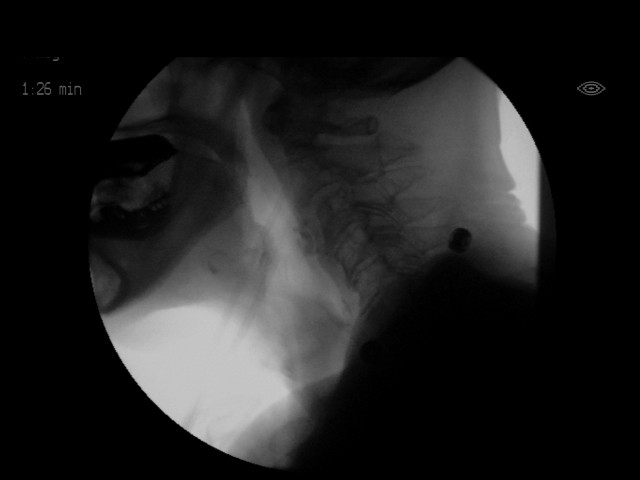
[im 14/17]
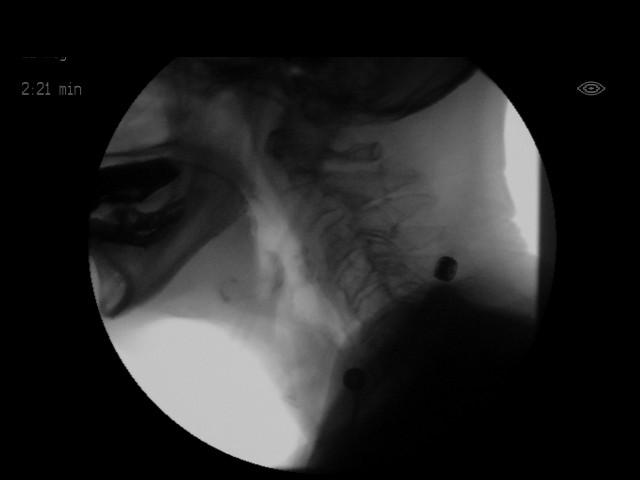
[im 14/17]
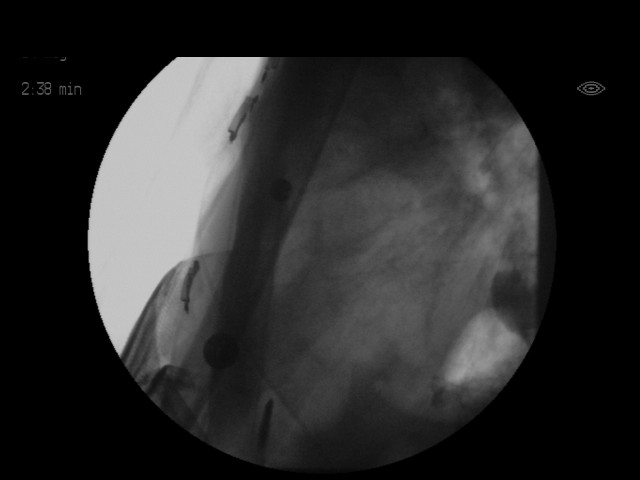
[im 15/17]
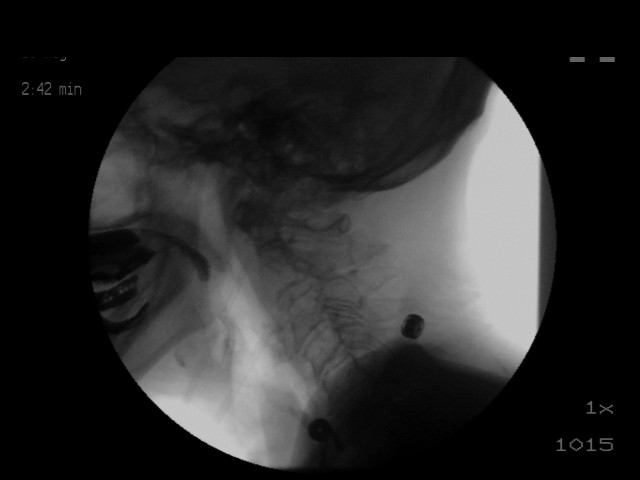
[im 17/17]
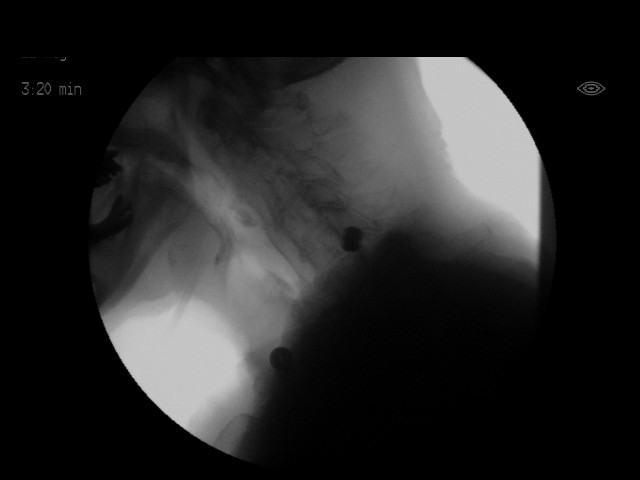
[im 17/17]
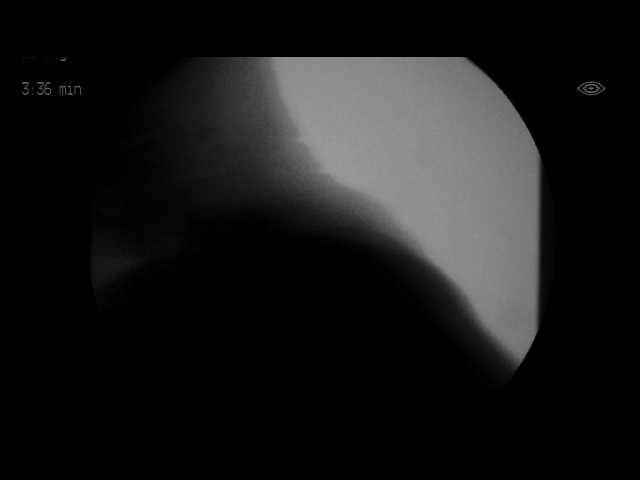

[18 of 24 positions shown; findings below may reference images not displayed]

FLUOROSCOPY FOR SWALLOWING FUNCTION STUDY:
Fluoroscopy was provided for swallowing function study, which was administered by a speech pathologist.  Final results and recommendations from this study are contained within the speech pathology report.

## 2017-10-08 IMAGING — DX DG CHEST 1V PORT
1 series · 1 of 1 positions shown · non-contrast
Comparison: 02/21/2017

CLINICAL DATA: Respiratory failure

EXAM:
PORTABLE CHEST 1 VIEW

[chest ap]
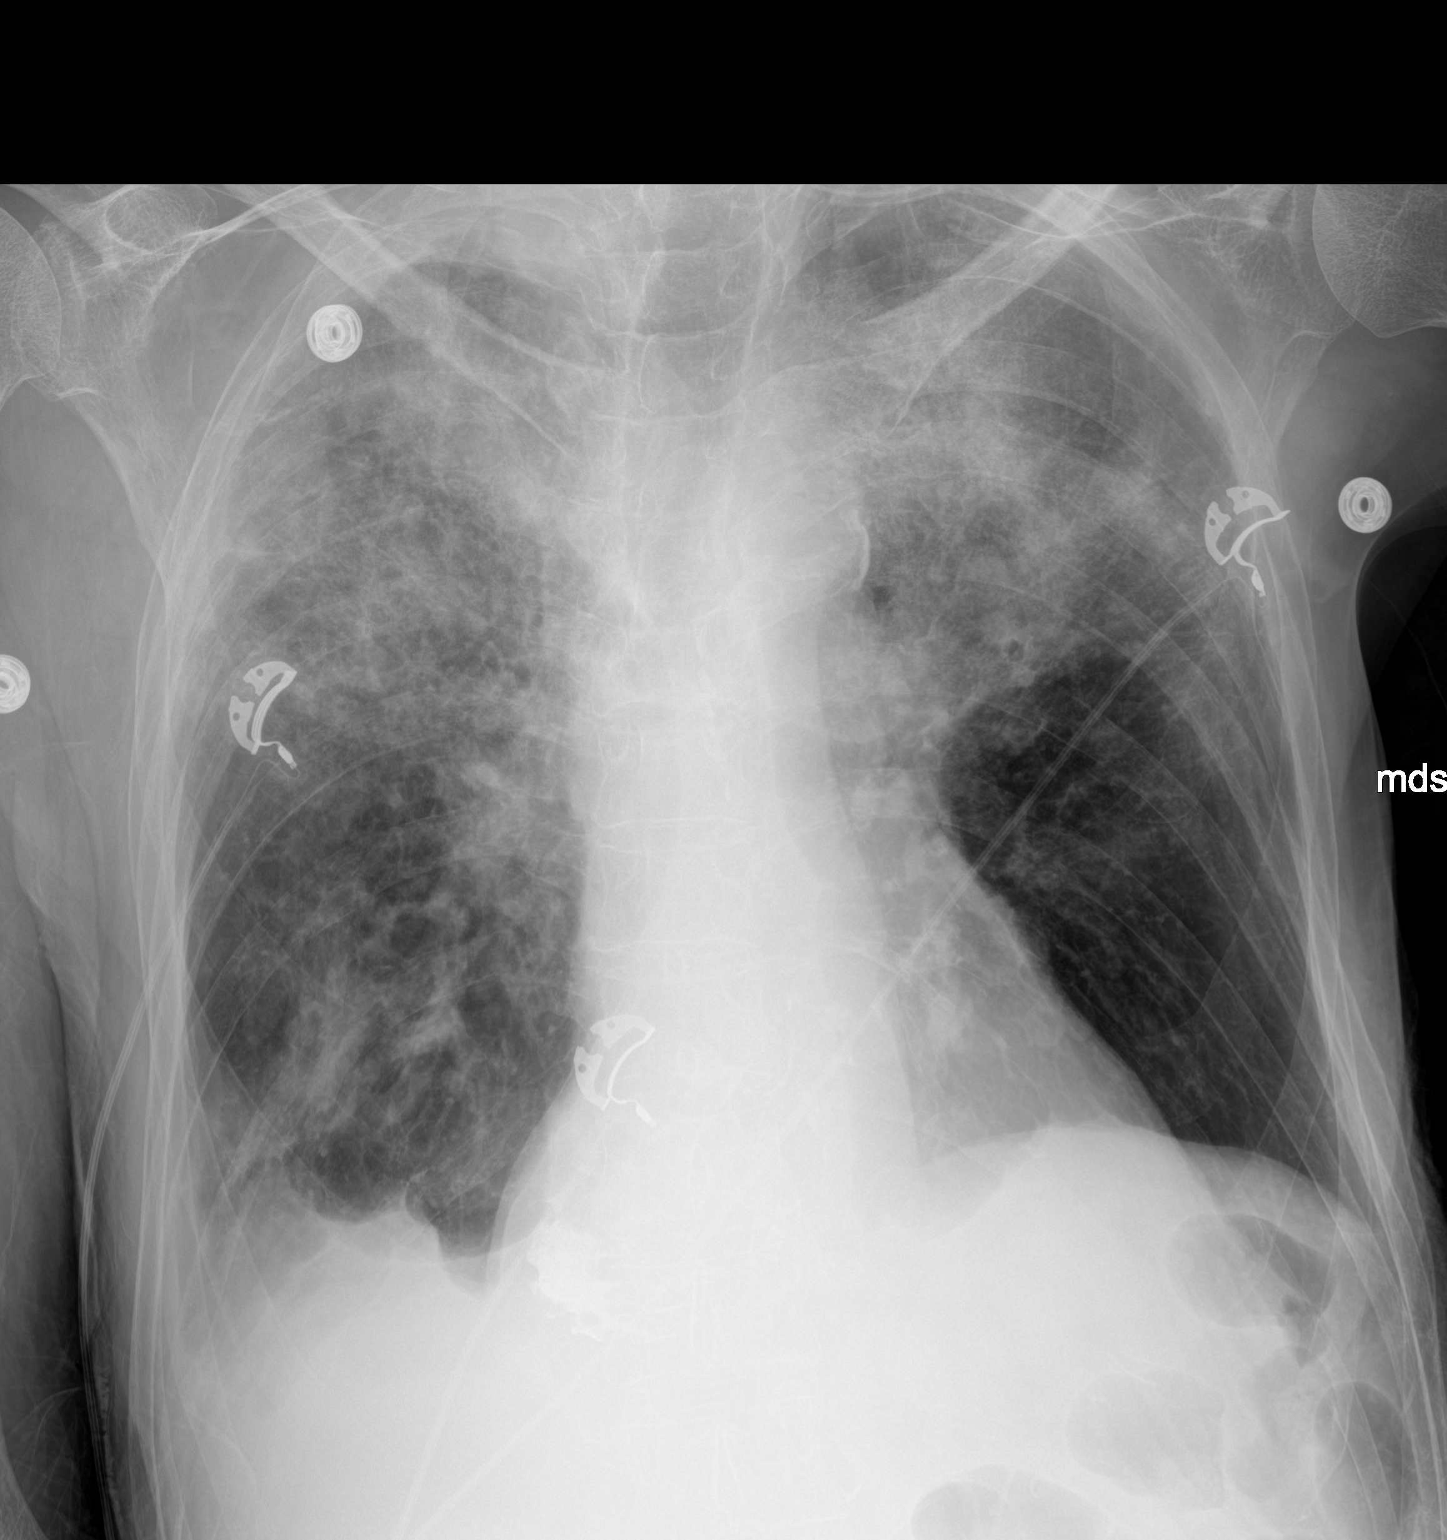

[1 of 1 positions shown; findings below may reference images not displayed]

FINDINGS: Multifocal patchy opacities in the bilateral upper lobes and to a
lesser extent the right lower lobe. This has progressed since the
prior, particularly in the left upper lobe, favoring multifocal
pneumonia.

Suspected small right pleural effusion, new.  No pneumothorax.

Mild cardiomegaly.
IMPRESSION: Multifocal patchy opacities, upper lobe predominant, progressed from
the prior and favoring multifocal pneumonia.

Small right pleural effusion, new.

## 2017-10-09 IMAGING — RF DG ESOPHAGUS
4 series · 16 of 16 positions shown · non-contrast
Comparison: Modified barium swallow performed yesterday

CLINICAL DATA: Dysphagia

EXAM:
ESOPHOGRAM/BARIUM SWALLOW
TECHNIQUE: Single contrast examination was performed using  and.
FLUOROSCOPY TIME:  Fluoroscopy Time:  1 minutes 6 seconds
Radiation Exposure Index (if provided by the fluoroscopic device):
Number of Acquired Spot Images: 0

[Series 1: cp_standard · 0.51mm/px · 4 of 102 frames shown (1 of 4)]
[frame 16/102]
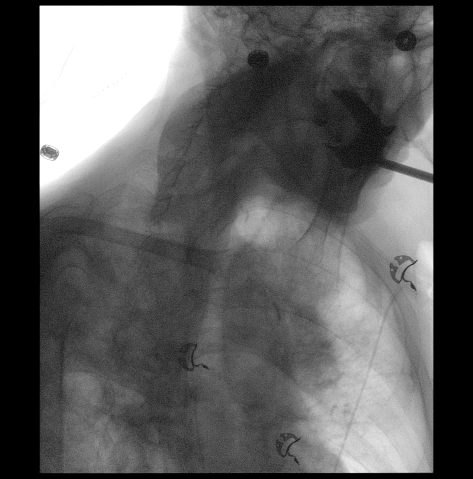
[frame 30/102]
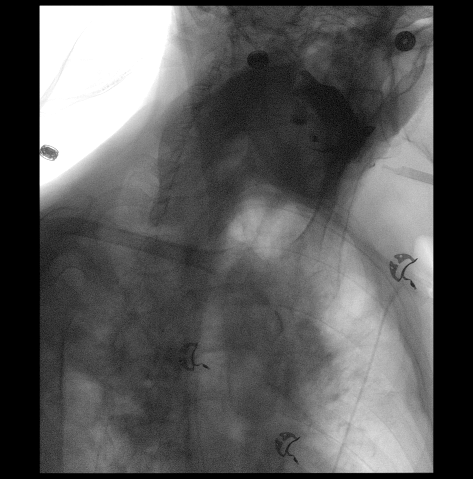
[frame 52/102]
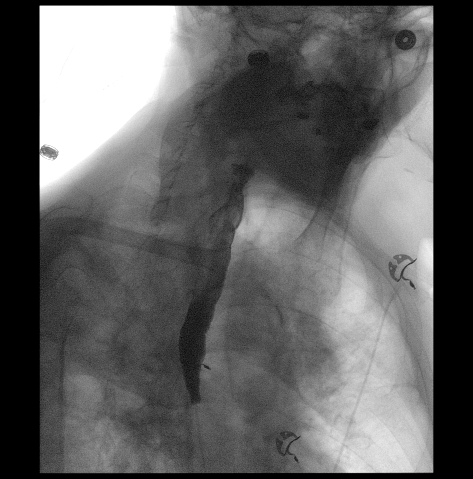
[frame 87/102]
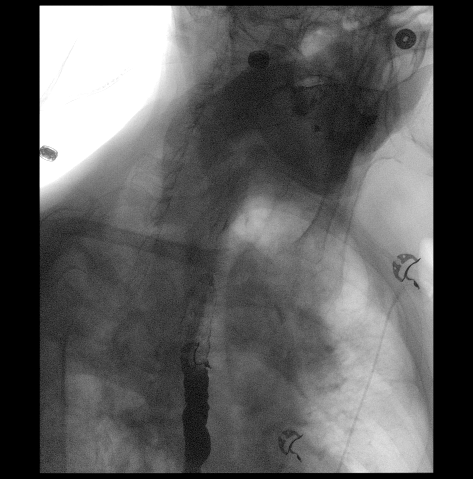

[Series 2: cp_standard · 0.51mm/px · 4 of 82 frames shown (2 of 4)]
[frame 13/82]
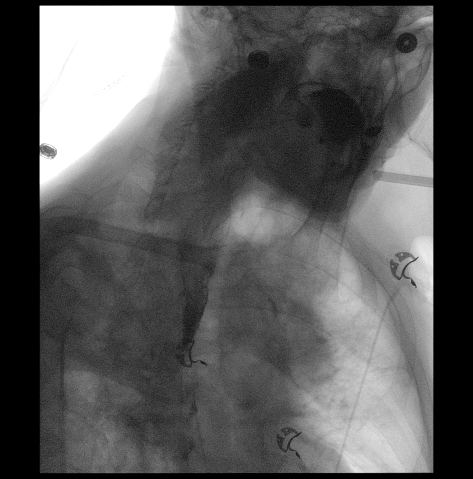
[frame 25/82]
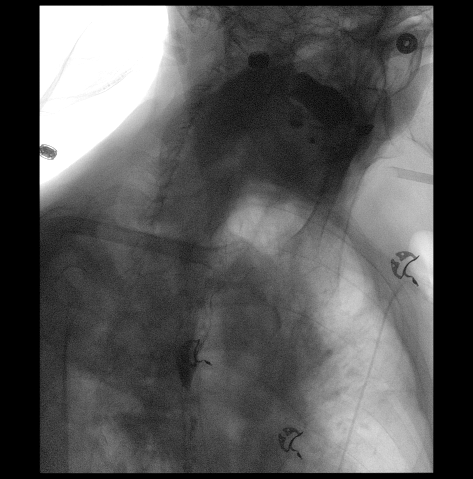
[frame 42/82]
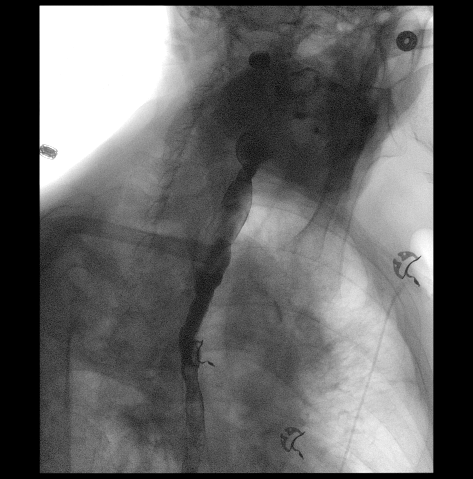
[frame 70/82]
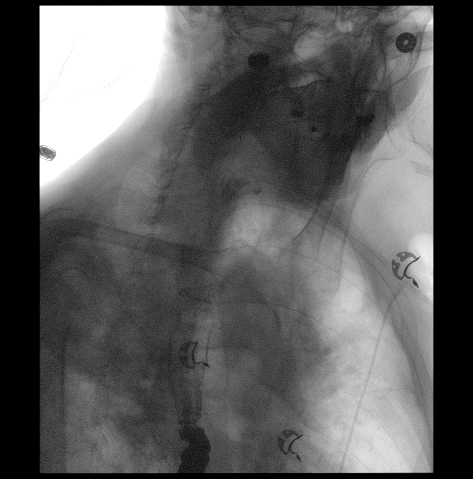

[Series 3: cp_standard · 0.52mm/px · 4 of 182 frames shown (3 of 4)]
[frame 26/182]
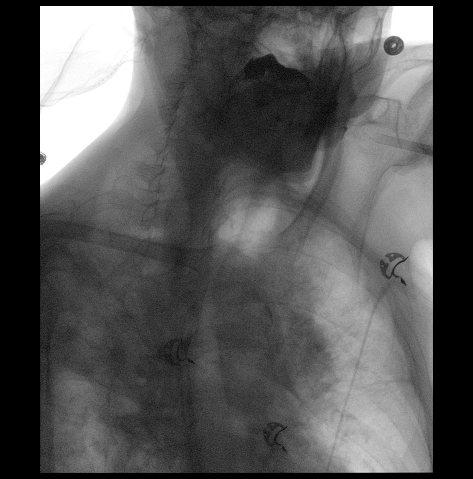
[frame 28/182]
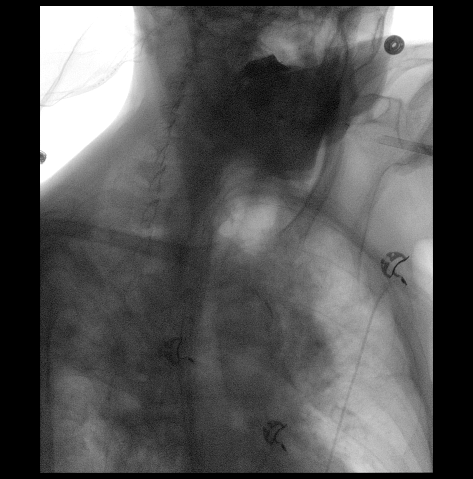
[frame 92/182]
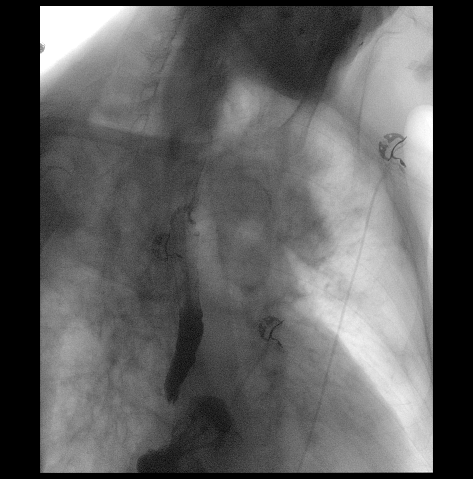
[frame 155/182]
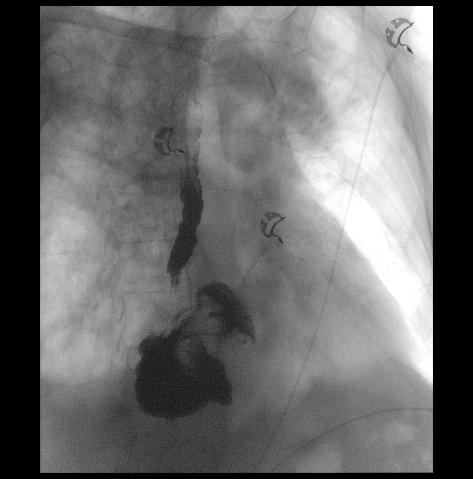

[Series 4: cp_standard · 0.52mm/px · 4 of 168 frames shown (4 of 4)]
[frame 18/168]
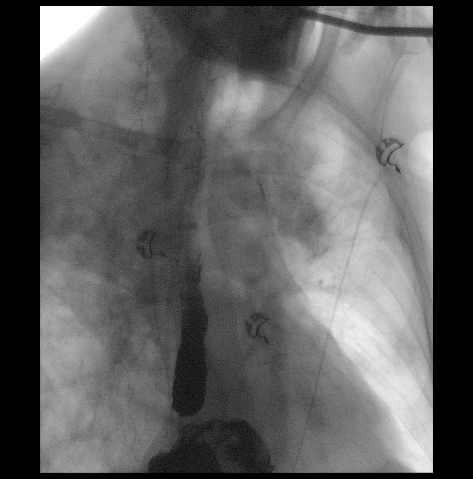
[frame 26/168]
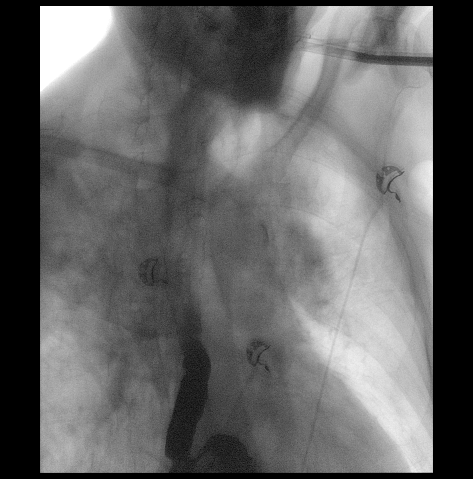
[frame 85/168]
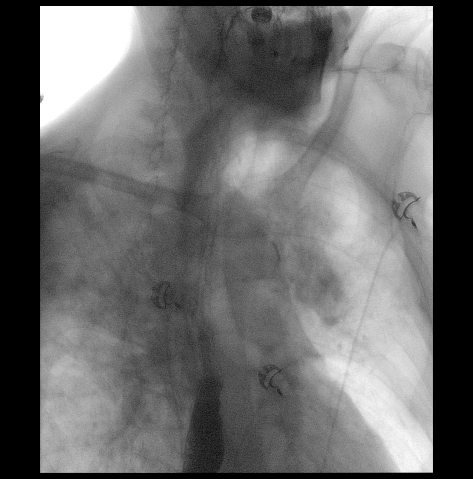
[frame 143/168]
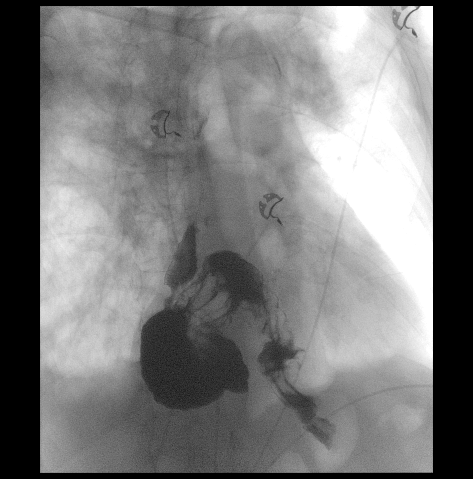

[16 of 16 positions shown; findings below may reference images not displayed]

FINDINGS: Study is limited by patient's condition and inability to swallow
large volumes, only small sips for noted. There is mild narrowing in
the proximal cervical esophagus. When compared to prior modified
barium swallow, this may be related to prominent cricopharyngeus
impression. No diverticulum noted. Extensive tertiary contractions
throughout the remainder of the esophagus. Large hiatal hernia
noted.
IMPRESSION: Mild narrowing in the proximal cervical esophagus may be related to
prominent cricopharyngeus impression. No diverticulum.

Large hiatal hernia.

Dysmotility, esophageal spasm.

## 2017-10-16 IMAGING — DX DG CHEST 1V PORT
1 series · 1 of 1 positions shown · non-contrast
Comparison: Chest radiograph 02/26/2017

CLINICAL DATA: Respiratory distress

EXAM:
PORTABLE CHEST 1 VIEW

[chest]
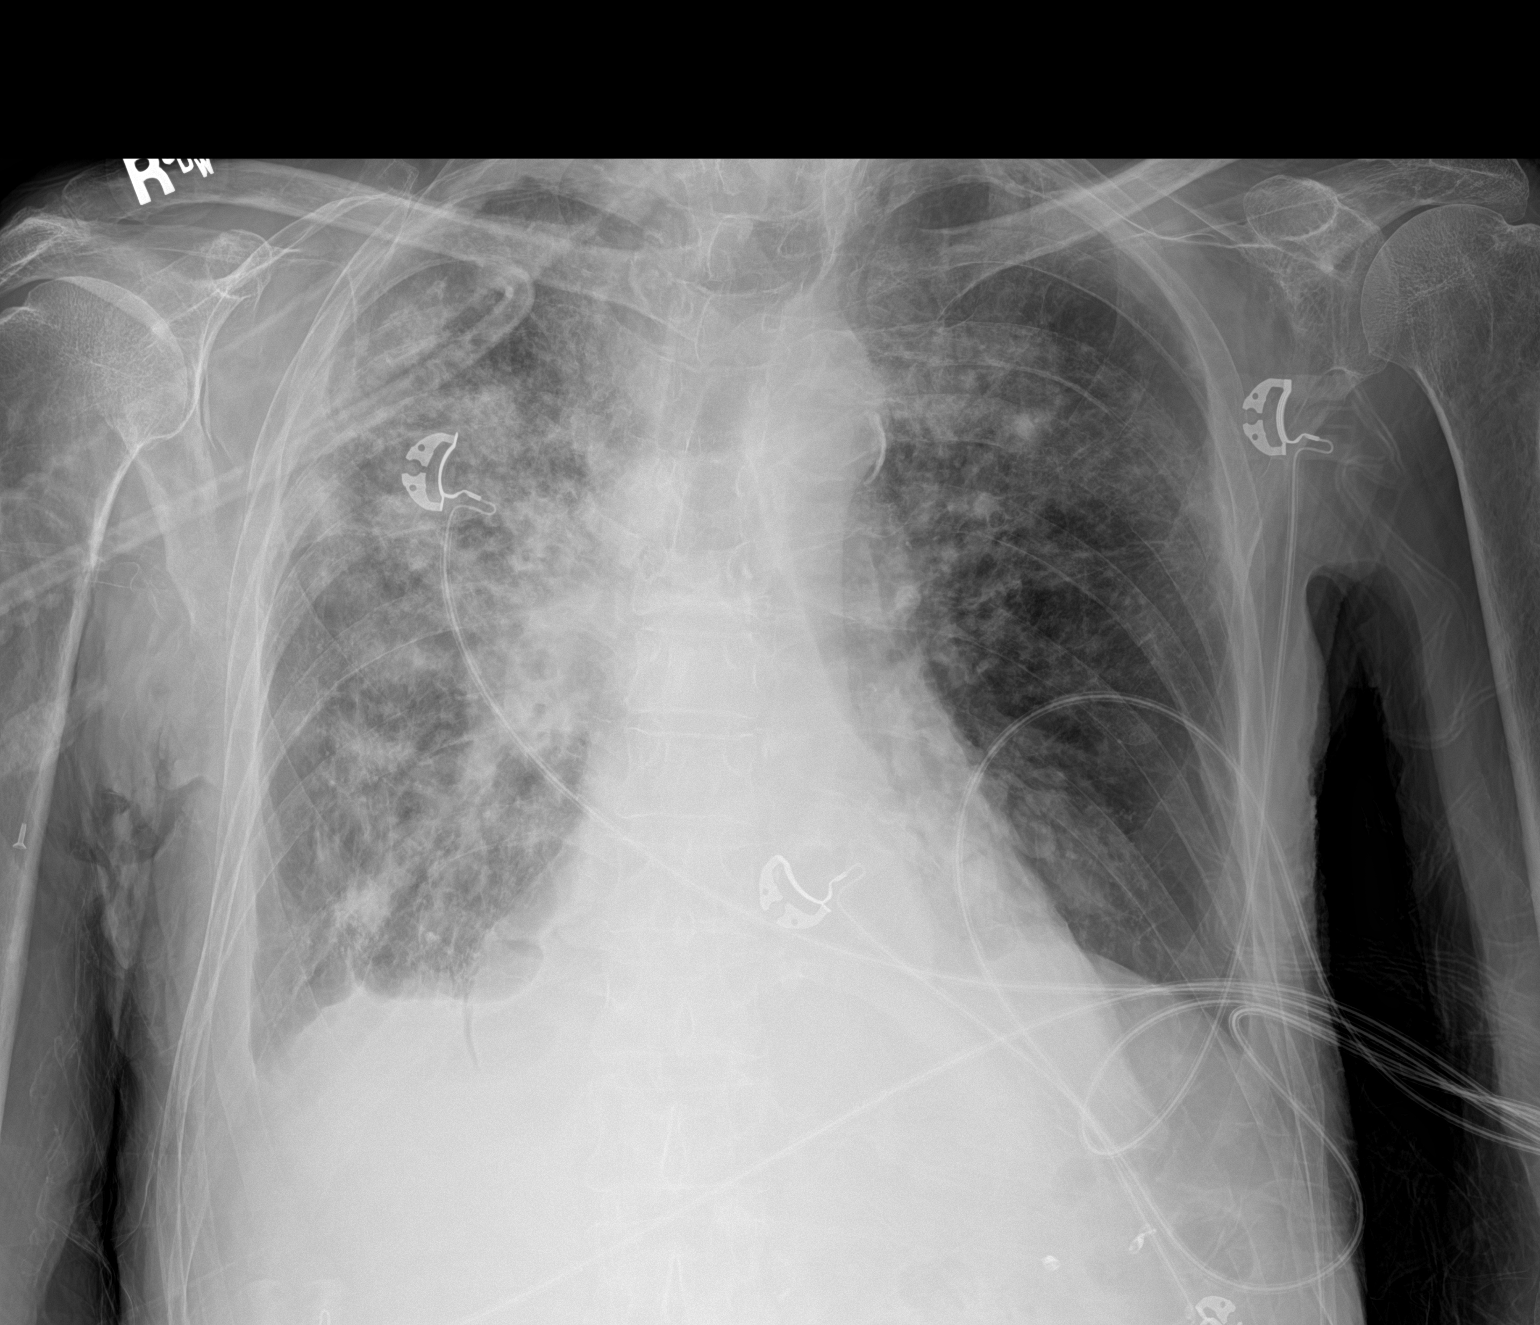

[1 of 1 positions shown; findings below may reference images not displayed]

FINDINGS: Multifocal, right worse than left, opacities have worsened since the
prior examination. Small right pleural effusion is unchanged. There
is calcific aortic atherosclerosis. Cardiomediastinal silhouette is
unchanged.
IMPRESSION: Slight worsening of likely multifocal pneumonia, particularly in the
right lung base.
